# Patient Record
Sex: Female | Born: 1945 | Race: White | Hispanic: No | Marital: Married | Smoking: Never smoker
Health system: Southern US, Community
[De-identification: ages and names within clinical notes are randomized; demographics above are authoritative.]

## PROBLEM LIST (undated history)

## (undated) DIAGNOSIS — I10 Essential (primary) hypertension: Secondary | ICD-10-CM

## (undated) DIAGNOSIS — M869 Osteomyelitis, unspecified: Secondary | ICD-10-CM

## (undated) DIAGNOSIS — N184 Chronic kidney disease, stage 4 (severe): Secondary | ICD-10-CM

## (undated) DIAGNOSIS — I255 Ischemic cardiomyopathy: Secondary | ICD-10-CM

## (undated) DIAGNOSIS — R0600 Dyspnea, unspecified: Secondary | ICD-10-CM

## (undated) DIAGNOSIS — I340 Nonrheumatic mitral (valve) insufficiency: Secondary | ICD-10-CM

## (undated) DIAGNOSIS — I739 Peripheral vascular disease, unspecified: Secondary | ICD-10-CM

## (undated) DIAGNOSIS — I5042 Chronic combined systolic (congestive) and diastolic (congestive) heart failure: Secondary | ICD-10-CM

## (undated) DIAGNOSIS — E785 Hyperlipidemia, unspecified: Secondary | ICD-10-CM

## (undated) DIAGNOSIS — R51 Headache: Secondary | ICD-10-CM

## (undated) DIAGNOSIS — R519 Headache, unspecified: Secondary | ICD-10-CM

## (undated) DIAGNOSIS — Z923 Personal history of irradiation: Secondary | ICD-10-CM

## (undated) DIAGNOSIS — I2510 Atherosclerotic heart disease of native coronary artery without angina pectoris: Secondary | ICD-10-CM

## (undated) DIAGNOSIS — I214 Non-ST elevation (NSTEMI) myocardial infarction: Secondary | ICD-10-CM

## (undated) DIAGNOSIS — B999 Unspecified infectious disease: Secondary | ICD-10-CM

## (undated) DIAGNOSIS — C50919 Malignant neoplasm of unspecified site of unspecified female breast: Secondary | ICD-10-CM

## (undated) DIAGNOSIS — E559 Vitamin D deficiency, unspecified: Secondary | ICD-10-CM

## (undated) DIAGNOSIS — R112 Nausea with vomiting, unspecified: Secondary | ICD-10-CM

## (undated) DIAGNOSIS — I447 Left bundle-branch block, unspecified: Secondary | ICD-10-CM

## (undated) DIAGNOSIS — Z8489 Family history of other specified conditions: Secondary | ICD-10-CM

## (undated) DIAGNOSIS — E119 Type 2 diabetes mellitus without complications: Secondary | ICD-10-CM

## (undated) DIAGNOSIS — M109 Gout, unspecified: Secondary | ICD-10-CM

## (undated) DIAGNOSIS — L97509 Non-pressure chronic ulcer of other part of unspecified foot with unspecified severity: Secondary | ICD-10-CM

## (undated) DIAGNOSIS — G629 Polyneuropathy, unspecified: Secondary | ICD-10-CM

## (undated) DIAGNOSIS — T7840XA Allergy, unspecified, initial encounter: Secondary | ICD-10-CM

## (undated) DIAGNOSIS — M1990 Unspecified osteoarthritis, unspecified site: Secondary | ICD-10-CM

## (undated) DIAGNOSIS — Z9889 Other specified postprocedural states: Secondary | ICD-10-CM

## (undated) HISTORY — DX: Peripheral vascular disease, unspecified: I73.9

## (undated) HISTORY — DX: Unspecified osteoarthritis, unspecified site: M19.90

## (undated) HISTORY — DX: Hyperlipidemia, unspecified: E78.5

## (undated) HISTORY — PX: DILATION AND CURETTAGE OF UTERUS: SHX78

## (undated) HISTORY — DX: Allergy, unspecified, initial encounter: T78.40XA

## (undated) HISTORY — PX: EYE SURGERY: SHX253

## (undated) HISTORY — DX: Essential (primary) hypertension: I10

## (undated) HISTORY — DX: Vitamin D deficiency, unspecified: E55.9

## (undated) HISTORY — DX: Atherosclerotic heart disease of native coronary artery without angina pectoris: I25.10

## (undated) HISTORY — DX: Malignant neoplasm of unspecified site of unspecified female breast: C50.919

## (undated) HISTORY — DX: Polyneuropathy, unspecified: G62.9

## (undated) HISTORY — DX: Ischemic cardiomyopathy: I25.5

## (undated) HISTORY — PX: FINGER SURGERY: SHX640

---

## 1998-11-08 ENCOUNTER — Other Ambulatory Visit: Admission: RE | Admit: 1998-11-08 | Discharge: 1998-11-08 | Payer: Self-pay | Admitting: *Deleted

## 1998-11-23 ENCOUNTER — Encounter: Admission: RE | Admit: 1998-11-23 | Discharge: 1999-02-21 | Payer: Self-pay | Admitting: *Deleted

## 1998-12-07 HISTORY — PX: CATARACT EXTRACTION W/ INTRAOCULAR LENS  IMPLANT, BILATERAL: SHX1307

## 2003-04-27 ENCOUNTER — Other Ambulatory Visit: Admission: RE | Admit: 2003-04-27 | Discharge: 2003-04-27 | Payer: Self-pay | Admitting: Internal Medicine

## 2005-06-19 ENCOUNTER — Other Ambulatory Visit: Admission: RE | Admit: 2005-06-19 | Discharge: 2005-06-19 | Payer: Self-pay | Admitting: Internal Medicine

## 2005-08-07 ENCOUNTER — Encounter (INDEPENDENT_AMBULATORY_CARE_PROVIDER_SITE_OTHER): Payer: Self-pay | Admitting: Specialist

## 2005-08-07 ENCOUNTER — Inpatient Hospital Stay (HOSPITAL_COMMUNITY): Admission: EM | Admit: 2005-08-07 | Discharge: 2005-08-10 | Payer: Self-pay | Admitting: Emergency Medicine

## 2007-09-24 ENCOUNTER — Other Ambulatory Visit: Admission: RE | Admit: 2007-09-24 | Discharge: 2007-09-24 | Payer: Self-pay | Admitting: Internal Medicine

## 2007-10-18 ENCOUNTER — Ambulatory Visit: Payer: Self-pay | Admitting: Gastroenterology

## 2007-12-06 ENCOUNTER — Ambulatory Visit: Payer: Self-pay | Admitting: Cardiovascular Disease

## 2007-12-16 ENCOUNTER — Ambulatory Visit: Payer: Self-pay

## 2008-04-07 HISTORY — PX: COLONOSCOPY W/ BIOPSIES AND POLYPECTOMY: SHX1376

## 2009-04-07 HISTORY — PX: CORONARY ARTERY BYPASS GRAFT: SHX141

## 2009-05-23 ENCOUNTER — Inpatient Hospital Stay (HOSPITAL_COMMUNITY): Admission: EM | Admit: 2009-05-23 | Discharge: 2009-06-01 | Payer: Self-pay | Admitting: Emergency Medicine

## 2009-05-23 ENCOUNTER — Ambulatory Visit: Payer: Self-pay | Admitting: Cardiology

## 2009-05-23 DIAGNOSIS — I214 Non-ST elevation (NSTEMI) myocardial infarction: Secondary | ICD-10-CM

## 2009-05-23 HISTORY — DX: Non-ST elevation (NSTEMI) myocardial infarction: I21.4

## 2009-05-24 ENCOUNTER — Ambulatory Visit: Payer: Self-pay | Admitting: Surgery

## 2009-05-24 ENCOUNTER — Encounter: Payer: Self-pay | Admitting: Cardiology

## 2009-05-24 HISTORY — PX: CARDIAC CATHETERIZATION: SHX172

## 2009-05-25 ENCOUNTER — Encounter: Payer: Self-pay | Admitting: Cardiology

## 2009-05-27 ENCOUNTER — Encounter: Payer: Self-pay | Admitting: Cardiovascular Disease

## 2009-05-29 ENCOUNTER — Encounter: Payer: Self-pay | Admitting: Cardiovascular Disease

## 2009-06-05 ENCOUNTER — Encounter: Payer: Self-pay | Admitting: Cardiovascular Disease

## 2009-06-19 ENCOUNTER — Ambulatory Visit: Payer: Self-pay | Admitting: Surgery

## 2009-06-19 ENCOUNTER — Encounter: Admission: RE | Admit: 2009-06-19 | Discharge: 2009-06-19 | Payer: Self-pay | Admitting: Surgery

## 2009-06-20 DIAGNOSIS — J438 Other emphysema: Secondary | ICD-10-CM

## 2009-06-20 DIAGNOSIS — I509 Heart failure, unspecified: Secondary | ICD-10-CM

## 2009-06-20 DIAGNOSIS — E782 Mixed hyperlipidemia: Secondary | ICD-10-CM

## 2009-06-20 DIAGNOSIS — I10 Essential (primary) hypertension: Secondary | ICD-10-CM | POA: Insufficient documentation

## 2009-06-20 DIAGNOSIS — J45909 Unspecified asthma, uncomplicated: Secondary | ICD-10-CM

## 2009-06-26 ENCOUNTER — Ambulatory Visit: Payer: Self-pay | Admitting: Cardiovascular Disease

## 2009-07-02 ENCOUNTER — Encounter: Payer: Self-pay | Admitting: Cardiovascular Disease

## 2009-07-12 ENCOUNTER — Encounter (HOSPITAL_COMMUNITY): Admission: RE | Admit: 2009-07-12 | Discharge: 2009-10-10 | Payer: Self-pay | Admitting: Cardiovascular Disease

## 2009-07-12 ENCOUNTER — Encounter: Payer: Self-pay | Admitting: Cardiovascular Disease

## 2009-07-12 ENCOUNTER — Ambulatory Visit: Payer: Self-pay

## 2009-07-12 ENCOUNTER — Ambulatory Visit (HOSPITAL_COMMUNITY): Admission: RE | Admit: 2009-07-12 | Discharge: 2009-07-12 | Payer: Self-pay | Admitting: Cardiovascular Disease

## 2009-07-12 ENCOUNTER — Ambulatory Visit: Payer: Self-pay | Admitting: Cardiology

## 2009-07-23 ENCOUNTER — Encounter: Admission: RE | Admit: 2009-07-23 | Discharge: 2009-10-21 | Payer: Self-pay | Admitting: Internal Medicine

## 2009-07-23 ENCOUNTER — Encounter: Payer: Self-pay | Admitting: Cardiovascular Disease

## 2009-09-05 ENCOUNTER — Ambulatory Visit: Payer: Self-pay | Admitting: Cardiovascular Disease

## 2009-09-05 DIAGNOSIS — I739 Peripheral vascular disease, unspecified: Secondary | ICD-10-CM

## 2009-11-28 ENCOUNTER — Encounter: Payer: Self-pay | Admitting: Cardiovascular Disease

## 2009-11-29 ENCOUNTER — Ambulatory Visit: Payer: Self-pay | Admitting: Cardiovascular Disease

## 2009-11-29 ENCOUNTER — Ambulatory Visit: Payer: Self-pay

## 2009-12-26 ENCOUNTER — Encounter: Payer: Self-pay | Admitting: Cardiovascular Disease

## 2009-12-28 ENCOUNTER — Other Ambulatory Visit: Admission: RE | Admit: 2009-12-28 | Discharge: 2009-12-28 | Payer: Self-pay | Admitting: Internal Medicine

## 2010-01-09 ENCOUNTER — Encounter (INDEPENDENT_AMBULATORY_CARE_PROVIDER_SITE_OTHER): Payer: Self-pay | Admitting: *Deleted

## 2010-01-21 ENCOUNTER — Encounter (INDEPENDENT_AMBULATORY_CARE_PROVIDER_SITE_OTHER): Payer: Self-pay | Admitting: *Deleted

## 2010-02-21 ENCOUNTER — Encounter (INDEPENDENT_AMBULATORY_CARE_PROVIDER_SITE_OTHER): Payer: Self-pay | Admitting: *Deleted

## 2010-02-22 ENCOUNTER — Ambulatory Visit: Payer: Self-pay | Admitting: Gastroenterology

## 2010-02-22 ENCOUNTER — Encounter (INDEPENDENT_AMBULATORY_CARE_PROVIDER_SITE_OTHER): Payer: Self-pay | Admitting: *Deleted

## 2010-03-05 ENCOUNTER — Telehealth (INDEPENDENT_AMBULATORY_CARE_PROVIDER_SITE_OTHER): Payer: Self-pay | Admitting: *Deleted

## 2010-03-08 ENCOUNTER — Ambulatory Visit: Payer: Self-pay | Admitting: Gastroenterology

## 2010-03-12 ENCOUNTER — Encounter: Payer: Self-pay | Admitting: Gastroenterology

## 2010-04-27 ENCOUNTER — Encounter: Payer: Self-pay | Admitting: Cardiovascular Disease

## 2010-05-07 NOTE — Letter (Signed)
Summary: Patient Notice- Polyp Results  [facility]  [address] [place], [place] [address]   Phone: [phone]  Fax: [phone]        March 12, 2010 MRN: [id number]    [name] [address] [place], [place]  [address]    Dear Ms. Sydney,  I am pleased to inform you that the colon polyp(s) removed during your recent colonoscopy was (were) found to be benign (no cancer detected) upon pathologic examination.  I recommend you have a repeat colonoscopy examination in 5_ years to look for recurrent polyps, as having colon polyps increases your risk for having recurrent polyps or even colon cancer in the future.  Should you develop new or worsening symptoms of abdominal pain, bowel habit changes or bleeding from the rectum or bowels, please schedule an evaluation with either your primary care physician or with me.  Additional information/recommendations:  __ No further action with gastroenterology is needed at this time. Please      follow-up with your primary care physician for your other healthcare      needs.  __ Please call [phone] to schedule a return visit to review your      situation.  __ Please keep your follow-up visit as already scheduled.  _x_ Continue treatment plan as outlined the day of your exam.  Please call us if you are having persistent problems or have questions about your condition that have not been fully answered at this time.  Sincerely,  Inda Castle MD  This letter has been electronically signed by your physician.  Appended Document: Patient Notice- Polyp Results Letter mailed

## 2010-05-07 NOTE — Procedures (Signed)
Summary: Colonoscopy  Patient: [name] Note: All result statuses are Final unless otherwise noted.  Tests: (1) Colonoscopy (COL)   COL Colonoscopy           [facility]     [place], [place]  [address]           COLONOSCOPY PROCEDURE REPORT           PATIENT:  Pamela, [name]  MR#:  [id number]     BIRTHDATE:  04/25/1945, 64 yrs. old  GENDER:  female           ENDOSCOPIST:  Sandy Salaam. Deatra Ina, MD     Referred by:  Rachel Moulds, D.O.           PROCEDURE DATE:  03/08/2010     PROCEDURE:  Colonoscopy with snare polypectomy     ASA CLASS:  Class II     INDICATIONS:  1) Routine Risk Screening           MEDICATIONS:   Fentanyl 50 mcg IV, Versed 6 mg IV           DESCRIPTION OF PROCEDURE:   After the risks benefits and     alternatives of the procedure were thoroughly explained, informed     consent was obtained.  Digital rectal exam was performed and     revealed no abnormalities.   The LB [id number] endoscope     was introduced through the anus and advanced to the cecum, which     was identified by both the appendix and ileocecal valve, without     limitations.  The quality of the prep was excellent, using     MoviPrep.  The instrument was then slowly withdrawn as the colon     was fully examined.     <<PROCEDUREIMAGES>>           FINDINGS:  Two polyps were found in the ascending colon (see     image5 and image6). 2 3-17mm sessile polyps - removed with cold     polypectomy snare and submitted to pathology  Moderate     diverticulosis was found in the sigmoid to descending colon     segments (see image13).  This was otherwise a normal examination     of the colon (see image2, image3, image4, image9, image12,     image16, and image18).   Retroflexed views in the rectum revealed     no abnormalities.    The time to cecum =  2.25  minutes. The scope     was then withdrawn (time =  8.25  min) from the patient and the     procedure  completed.           COMPLICATIONS:  None           ENDOSCOPIC IMPRESSION:     1) Two polyps in the ascending colon     2) Moderate diverticulosis in the sigmoid to descending colon     segments     3) Otherwise normal examination     RECOMMENDATIONS:     1) If the polyp(s) removed today are proven to be adenomatous     (pre-cancerous) polyps, you will need a repeat colonoscopy in 5     years. Otherwise you should continue to follow colorectal cancer     screening guidelines for "routine risk" patients with colonoscopy     in 10 years.  REPEAT EXAM:   You will receive a letter from Dr. Deatra Ina in 1-2     weeks, after reviewing the final pathology, with followup     recommendations.           ______________________________     Sandy Salaam Deatra Ina, MD           CC:           n.     eSIGNED:   Sandy Salaam. [name] at 03/08/2010 08:46 AM           [name], [id number]  Note: An exclamation mark (!) indicates a result that was not dispersed into the flowsheet. Document Creation Date: 03/08/2010 8:47 AM _______________________________________________________________________  (1) Order result status: Final Collection or observation date-time: 03/08/2010 08:40 Requested date-time:  Receipt date-time:  Reported date-time:  Referring Physician:   Ordering Physician: Erskine Emery [phone]) Specimen Source:  Source: Tawanna Cooler Order Number: [id number] Lab site:   Appended Document: Colonoscopy     Procedures Next Due Date:    Colonoscopy: 03/2015

## 2010-05-07 NOTE — Assessment & Plan Note (Signed)
Summary: per check out/sf   Primary Provider:  DR. Rachel Moulds  CC:  check up.  History of Present Illness: [name] is seen as a new patient to me.  I believe I saw her a few years ago. for LBBB.  She was hospitalized at the end of February for [place].  Cath by Dr Marigene Ehlers showed severe 2VD involving the LAD and a codominant circ.  She had been noncompliant with her meds especially DM and HTN.  I reviewed her hospital records and cath film  She subsequently had CABG by Dr Cyndia Bent.  Her post op course was bening.  She has F/U with Dr Cyndia Bent and wounds have healed well with no pleural effusion on F/U CXR. Her BP has been running high.  She would benefit from a higher dose of Diovan.  She says she has been compliant with her meds since hospital D/C  Echo on 4/7 showed EF 40-45% with mild to moderate MR.  Current Problems (verified): 1)  Claudication  (ICD-443.9) 2)  Hyperlipidemia  (ICD-272.4) 3)  CHF  (ICD-428.0) 4)  Hypertension  (ICD-401.9) 5)  Cad  (ICD-414.00) 6)  Shortness of Breath  (ICD-786.05) 7)  Renal Insufficiency  (ICD-588.9) 8)  Gout  (ICD-274.9) 9)  Hypercholesterolemia  (ICD-272.0) 10)  Emphysema  (ICD-492.8) 11)  Asthma  (ICD-493.90) 12)  Dm  (ICD-250.00)  Current Medications (verified): 1)  Oxycodone Hcl 5 Mg Caps (Oxycodone Hcl) .... As Needed 2)  Metformin Hcl 500 Mg Tabs (Metformin Hcl) .... 2 Tabs  By Mouth Two Times A Day 3)  Diovan 320 Mg Tabs (Valsartan) .... One Half Tablet By Mouth Once Daily 4)  Crestor 40 Mg Tabs (Rosuvastatin Calcium) .... Take One Tablet By Mouth Daily. 5)  Metoprolol Tartrate 25 Mg Tabs (Metoprolol Tartrate) .... Take One Tablet By Mouth Twice A Day 6)  Onglyza 2.5 Mg Tabs (Saxagliptin Hcl) .Marland Kitchen.. 1 Tab  Po  Once Daily 7)  Glimepiride 4 Mg Tabs (Glimepiride) .Marland Kitchen.. 1 Tab By Mouth Two Times A Day 8)  Aspirin Ec 325 Mg Tbec (Aspirin) .... Take One Tablet By Mouth Daily 9)  Amlodipine Besylate 5 Mg Tabs (Amlodipine Besylate) .... Take One Tablet By  Mouth Daily  Allergies (verified): No Known Drug Allergies  Past History:  Past Medical History: Last updated: 08-Jul-2009 Current Problems:  HYPERLIPIDEMIA (ICD-272.4) CHF (ICD-428.0) HYPERTENSION (ICD-401.9) CAD (ICD-414.00) SHORTNESS OF BREATH (ICD-786.05) RENAL INSUFFICIENCY (ICD-588.9) GOUT (ICD-274.9) HYPERCHOLESTEROLEMIA (ICD-272.0) EMPHYSEMA (ICD-492.8) ASTHMA (ICD-493.90) DM (ICD-250.00)  Past Surgical History: Last updated: 08-Jul-2009  Median sternotomy, extracorporeal circulation,  coronary artery bypass graft surgery x4 using left internal mammary artery graft to left anterior descending coronary artery, with a  saphenous vein graft to diagonal branch of the LAD, and a sequential saphenous vein graft to first and second obtuse marginal branches of the   left circumflex coronary artery.  Endoscopic vein harvesting from the  right leg.  Gilford Raid, M.D.  BB/MEDQ  D:  05/28/2009  T:  05/29/2009  Job:  [id number] cc:   Wallis Bamberg. Johnsie Cancel, MD, [facility]    I&D right index finger DIP joint with arthrotomy and synovectomy of the distal interphalangeal joint.  Family History: Last updated: 07-08-2009  Father died with a history of CABG and pneumonia.  Her   mother is alive at age 14 with a history of CABG in her 12s as well as   hypertension, diabetes.   Social History: Last updated: 08-Jul-2009 She lives with her husband.  They have 1 child and  3   grandchildren.  Her extended family is in and [place], [place].  She   denies tobacco, alcohol, or drug use.  She is not routinely  Review of Systems       Denies fever, malais, weight loss, blurry vision, decreased visual acuity, cough, sputum, SOB, hemoptysis, pleuritic pain, palpitaitons, heartburn, abdominal pain, melena, lower extremity edema, claudication, or rash.   Vital Signs:  Patient profile:   65 year old female Height:      64 inches Weight:      170 pounds BMI:     29.29 Pulse rate:   89 / minute Resp:     14  per minute BP sitting:   155 / 96  (left arm)  Vitals Entered By: Burnett Kanaris (September 05, 2009 11:26 AM)  Physical Exam  General:  Affect appropriate Healthy:  appears stated age 36: normal Neck supple with no adenopathy JVP normal no bruits no thyromegaly Lungs clear with no wheezing and good diaphragmatic motion Heart:  S1/S2 no murmur,rub, gallop or click PMI normal Abdomen: benighn, BS positve, no tenderness, no AAA no bruit.  No HSM or HJR Distal pulses intact with no bruits No edema Neuro non-focal Skin warm and dry    Impression & Recommendations:  Problem # 1:  HYPERTENSION (ICD-401.9) Add amlodipine and continue Diovan Her updated medication list for this problem includes:    Diovan 320 Mg Tabs (Valsartan) ..... One half tablet by mouth once daily    Metoprolol Tartrate 25 Mg Tabs (Metoprolol tartrate) .Marland Kitchen... Take one tablet by mouth twice a day    Aspirin Ec 325 Mg Tbec (Aspirin) .Marland Kitchen... Take one tablet by mouth daily    Amlodipine Besylate 5 Mg Tabs (Amlodipine besylate) .Marland Kitchen... Take one tablet by mouth daily  Problem # 2:  HYPERLIPIDEMIA (ICD-272.4) Continue statin in light of recent CABG Her updated medication list for this problem includes:    Crestor 40 Mg Tabs (Rosuvastatin calcium) .Marland Kitchen... Take one tablet by mouth daily.  Problem # 3:  CAD (ICD-414.00) S/P CABG  Wounds healed well Continue ASA and BB Her updated medication list for this problem iSncludes:    Metoprolol Tartrate 25 Mg Tabs (Metoprolol tartrate) .Marland Kitchen... Take one tablet by mouth twice a day    Aspirin Ec 325 Mg Tbec (Aspirin) .Marland Kitchen... Take one tablet by mouth daily    Amlodipine Besylate 5 Mg Tabs (Amlodipine besylate) .Marland Kitchen... Take one tablet by mouth daily  Other Orders: Arterial Duplex Lower Extremity (Arterial Duplex Low)  Patient Instructions: 1)  Your physician recommends that you schedule a follow-up appointment in: 8-10 WEEKS 2)  Your physician has recommended you make the following  change in your medication: START AMLODIPINE 5MG  ONCE DAILY 3)  Your physician has requested that you have a lower or upper extremity arterial duplex.  This test is an ultrasound of the arteries in the legs or arms.  It looks at arterial blood flow in the legs and arms.  Allow one hour for Lower and Upper Arterial scans. There are no restrictions or special instructions.SCHEDULE IN 8-10 WEEKS AT TIME OF FOLLOW UP Prescriptions: AMLODIPINE BESYLATE 5 MG TABS (AMLODIPINE BESYLATE) Take one tablet by mouth daily  #30 x 12   Entered by:   Fredia Beets, RN   Authorized by:   Bosie Clos, MD, [facility]   Signed by:   Fredia Beets, RN on 09/05/2009   Method used:   Electronically to  [name] ([facility]       [address] [name] [address]       [place], [place]  [address]       Ph: [phone] or [phone]       Fax: [phone]   RxID:   [id number]

## 2010-05-07 NOTE — Letter (Signed)
Summary: [facility] Instructions  [facility]  [place], [place] [address]   Phone: [phone]  Fax: [phone]       [name]    20-Jan-1946    MRN: [id number]        Procedure Day Sudie Grumbling:  Wendi Snipes  03/08/10     Arrival Time:  7:30AM      Procedure Time:  8:30AM     Location of Procedure:                    [facility]  [place] (4th Floor)                      [place]   Starting 5 days prior to your procedure 03/03/10 do not eat nuts, seeds, popcorn, corn, beans, peas,  salads, or any raw vegetables.  Do not take any fiber supplements (e.g. Metamucil, Citrucel, and Benefiber).  THE DAY BEFORE YOUR PROCEDURE         DATE: 03/07/10  DAY: THURSDAY  1.  Drink clear liquids the entire day-NO SOLID FOOD  2.  Do not drink anything colored red or purple.  Avoid juices with pulp.  No orange juice.  3.  Drink at least 64 oz. (8 glasses) of fluid/clear liquids during the day to prevent dehydration and help the prep work efficiently.  CLEAR LIQUIDS INCLUDE: Water Jello Ice Popsicles Tea (sugar ok, no milk/cream) Powdered fruit flavored drinks Coffee (sugar ok, no milk/cream) Gatorade Juice: apple, white grape, white cranberry  Lemonade Clear bullion, consomm, broth Carbonated beverages (any kind) Strained chicken noodle soup Hard Candy                             4.  In the morning, mix first dose of MoviPrep solution:    Empty 1 Pouch A and 1 Pouch B into the disposable container    Add lukewarm drinking water to the top line of the container. Mix to dissolve    Refrigerate (mixed solution should be used within 24 hrs)  5.  Begin drinking the prep at 5:00 p.m. The MoviPrep container is divided by 4 marks.   Every 15 minutes drink the solution down to the next mark (approximately 8 oz) until the full liter is complete.   6.  Follow completed prep with 16 oz of clear liquid of your choice (Nothing  red or purple).  Continue to drink clear liquids until bedtime.  7.  Before going to bed, mix second dose of MoviPrep solution:    Empty 1 Pouch A and 1 Pouch B into the disposable container    Add lukewarm drinking water to the top line of the container. Mix to dissolve    Refrigerate  THE DAY OF YOUR PROCEDURE      DATE: 03/08/10  DAY: FRIDAY  Beginning at 3:30AM (5 hours before procedure):         1. Every 15 minutes, drink the solution down to the next mark (approx 8 oz) until the full liter is complete.  2. Follow completed prep with 16 oz. of clear liquid of your choice.    3. You may drink clear liquids until 6:30AM (2 HOURS BEFORE PROCEDURE).   MEDICATION INSTRUCTIONS  Unless otherwise instructed, you should take regular prescription medications with a small sip of water   as early as possible the morning of  your procedure.  Diabetic patients - see separate instructions.   Additional medication instructions: Hold HCTZ the morning of procedure.         OTHER INSTRUCTIONS  You will need a responsible adult at least 65 years of age to accompany you and drive you home.   This person must remain in the waiting room during your procedure.  Wear loose fitting clothing that is easily removed.  Leave jewelry and other valuables at home.  However, you may wish to bring a book to read or  an iPod/MP3 player to listen to music as you wait for your procedure to start.  Remove all body piercing jewelry and leave at home.  Total time from sign-in until discharge is approximately 2-3 hours.  You should go home directly after your procedure and rest.  You can resume normal activities the  day after your procedure.  The day of your procedure you should not:   Drive   Make legal decisions   Operate machinery   Drink alcohol   Return to work  You will receive specific instructions about eating, activities and medications before you leave.    The above  instructions have been reviewed and explained to me by  Emerson Monte RN  February 22, 2010 10:25 AM     I fully understand and can verbalize these instructions _____________________________ Date _________

## 2010-05-07 NOTE — Progress Notes (Signed)
Summary: Questions  Phone Note Call from Patient Call back at Home Phone [phone]   Caller: Patient Call For: Dr. Deatra Ina Summary of Call: Questions about what she can and can not eat prior to her procedure on friday Initial call taken by: Becky Sax,  March 05, 2010 2:50 PM  Follow-up for Phone Call        Diet restrictions reviewed with pt. Follow-up by: Ulice Dash RN,  March 05, 2010 4:20 PM

## 2010-05-07 NOTE — Miscellaneous (Signed)
Summary: [facility] Progress Note  [facility] Progress Note   Imported By: Sallee Provencal 08/01/2009 14:07:10  _____________________________________________________________________  External Attachment:    Type:   Image     Comment:   External Document

## 2010-05-07 NOTE — Letter (Signed)
Summary: Diabetic Instructions  [facility]  [place], [place] [address]   Phone: [phone]  Fax: [phone]    [name] 06-12-1945 MRN: [id number]   _ x _   ORAL DIABETIC MEDICATION INSTRUCTIONS  The day before your procedure:   Take your diabetic pill as you do normally  The day of your procedure:   Do not take your diabetic pill    We will check your blood sugar levels during the admission process and again in Recovery before discharging you home  ________________________________________________________________________

## 2010-05-07 NOTE — Letter (Signed)
Summary: Generic Letter  [facility] [place]  [address] [place]   [place], [place] [address]   Phone: [phone]  Fax: [phone]        January 09, 2010 MRN: [id number]    [name] [address] [place], [place]  [address]    Dear [name],        [name], I have attached a copy of the lab work we received from Dr Melford Aase. Dr Johnsie Cancel agrees with restarting the crestor to get the cholestrol numbers lower. Please follow up with Dr Melford Aase after restarting the statin. Also if you would like a referral to a Dietician to help with the cholestrol and your blood sugar please let me know and we can make that referral for you. Please call us with any questions or concerns.      Sincerely,  Fredia Beets, RN/Dr Jenkins Rouge

## 2010-05-07 NOTE — Miscellaneous (Signed)
Summary: [facility] Progress Note  [facility] Progress Note   Imported By: Sallee Provencal 09/17/2009 12:49:03  _____________________________________________________________________  External Attachment:    Type:   Image     Comment:   External Document

## 2010-05-07 NOTE — Letter (Signed)
Summary: Pre Visit Letter Revised  [facility]  [place], [place] [address]   Phone: [phone]  Fax: [phone]        01/21/2010 MRN: [id number] GURKIRAT RASPANTI [place],   [address]             Procedure Date:  03-08-10   Welcome to the Gastroenterology Division at [facility].    You are scheduled to see a nurse for your pre-procedure visit on 02-22-10 at 10:00a.m. on the 3rd floor at [facility] [place] [employer].  We ask that you try to arrive at our office 15 minutes prior to your appointment time to allow for check-in.  Please take a minute to review the attached form.  If you answer "Yes" to one or more of the questions on the first page, we ask that you call the person listed at your earliest opportunity.  If you answer "No" to all of the questions, please complete the rest of the form and bring it to your appointment.    Your nurse visit will consist of discussing your medical and surgical history, your immediate family medical history, and your medications.   If you are unable to list all of your medications on the form, please bring the medication bottles to your appointment and we will list them.  We will need to be aware of both prescribed and over the counter drugs.  We will need to know exact dosage information as well.    Please be prepared to read and sign documents such as consent forms, a financial agreement, and acknowledgement forms.  If necessary, and with your consent, a friend or relative is welcome to sit-in on the nurse visit with you.  Please bring your insurance card so that we may make a copy of it.  If your insurance requires a referral to see a specialist, please bring your referral form from your primary care physician.  No co-pay is required for this nurse visit.     If you cannot keep your appointment, please call [phone] to cancel or reschedule prior to your appointment date.  This allows  [name] the opportunity to schedule an appointment for another patient in need of care.    Thank you for choosing [facility] for your medical needs.  We appreciate the opportunity to care for you.  Please visit [name] at our website  to learn more about our practice.  Sincerely, The Gastroenterology Division

## 2010-05-07 NOTE — Assessment & Plan Note (Signed)
Summary: eph/ gd   History of Present Illness: [name] is seen as a new patient to me.  I believe I saw her a few years ago. for LBBB.  She was hospitalized at the end of February for [place].  Cath by Dr Marigene Ehlers showed severe 2VD involving the LAD and a codominant circ.  She had been noncompliant with her meds especially DM and HTN.  I reviewed her hospital records and cath film  She subsequently had CABG by Dr Cyndia Bent.  Her post op course was bening.  She has F/U with Dr Cyndia Bent and wounds have healed well with no pleural effusion on F/U CXR. Her BP has been running high.  She would benefit from a higher dose of Diovan.  She says she has been compliant with her meds since hospital D/C  Current Problems (verified): 1)  Hyperlipidemia  (ICD-272.4) 2)  CHF  (ICD-428.0) 3)  Hypertension  (ICD-401.9) 4)  Cad  (ICD-414.00) 5)  Shortness of Breath  (ICD-786.05) 6)  Renal Insufficiency  (ICD-588.9) 7)  Gout  (ICD-274.9) 8)  Hypercholesterolemia  (ICD-272.0) 9)  Emphysema  (ICD-492.8) 10)  Asthma  (ICD-493.90) 11)  Dm  (ICD-250.00)  Current Medications (verified): 1)  Oxycodone Hcl 5 Mg Caps (Oxycodone Hcl) .... As Needed 2)  Metformin Hcl 500 Mg Tabs (Metformin Hcl) .Marland Kitchen.. 1 Tab By Mouth Two Times A Day 3)  Diovan 320 Mg Tabs (Valsartan) .... One Half Tablet By Mouth Once Daily 4)  Crestor 40 Mg Tabs (Rosuvastatin Calcium) .... Take One Tablet By Mouth Daily. 5)  Metoprolol Tartrate 25 Mg Tabs (Metoprolol Tartrate) .... Take One Tablet By Mouth Twice A Day 6)  Onglyza 2.5 Mg Tabs (Saxagliptin Hcl) .Marland Kitchen.. 1 Tab  Po  Once Daily 7)  Glimepiride 4 Mg Tabs (Glimepiride) .Marland Kitchen.. 1 Tab By Mouth Two Times A Day 8)  Aspirin Ec 325 Mg Tbec (Aspirin) .... Take One Tablet By Mouth Daily  Allergies (verified): No Known Drug Allergies  Past History:  Past Medical History: Last updated: 07-18-09 Current Problems:  HYPERLIPIDEMIA (ICD-272.4) CHF (ICD-428.0) HYPERTENSION (ICD-401.9) CAD (ICD-414.00) SHORTNESS  OF BREATH (ICD-786.05) RENAL INSUFFICIENCY (ICD-588.9) GOUT (ICD-274.9) HYPERCHOLESTEROLEMIA (ICD-272.0) EMPHYSEMA (ICD-492.8) ASTHMA (ICD-493.90) DM (ICD-250.00)  Past Surgical History: Last updated: 2009-07-18  Median sternotomy, extracorporeal circulation,  coronary artery bypass graft surgery x4 using left internal mammary artery graft to left anterior descending coronary artery, with a  saphenous vein graft to diagonal branch of the LAD, and a sequential saphenous vein graft to first and second obtuse marginal branches of the   left circumflex coronary artery.  Endoscopic vein harvesting from the  right leg.  Gilford Raid, M.D.  BB/MEDQ  D:  05/28/2009  T:  05/29/2009  Job:  [id number] cc:   Wallis Bamberg. Johnsie Cancel, MD, [facility]    I&D right index finger DIP joint with arthrotomy and synovectomy of the distal interphalangeal joint.  Family History: Last updated: 07/18/2009  Father died with a history of CABG and pneumonia.  Her   mother is alive at age 7 with a history of CABG in her 65s as well as   hypertension, diabetes.   Social History: Last updated: July 18, 2009 She lives with her husband.  They have 1 child and 3   grandchildren.  Her extended family is in and [place], [place].  She   denies tobacco, alcohol, or drug use.  She is not routinely  Review of Systems       Denies fever, malais, weight loss, blurry vision, decreased  visual acuity, cough, sputum, SOB, hemoptysis, pleuritic pain, palpitaitons, heartburn, abdominal pain, melena, lower extremity edema, claudication, or rash.   Vital Signs:  Patient profile:   65 year old female Height:      64 inches Weight:      169 pounds BMI:     29.11 Pulse rate:   85 / minute Resp:     12 per minute BP sitting:   177 / 90  (left arm)  Vitals Entered By: Burnett Kanaris (June 26, 2009 3:43 PM)  Physical Exam  General:  Affect appropriate Healthy:  appears stated age 12: normal Neck supple with no adenopathy JVP normal  no bruits no thyromegaly Lungs clear with no wheezing and good diaphragmatic motion Heart:  S1/S2 no murmur,rub, gallop or click PMI normal Abdomen: benighn, BS positve, no tenderness, no AAA no bruit.  No HSM or HJR Distal pulses intact with no bruits No edema Neuro non-focal Skin warm and dry Endoscopic RLE harvest sige well healed Sternum well healed   Impression & Recommendations:  Problem # 1:  CAD (ICD-414.00) S/P CABG.  Continue ASA and BB Her updated medication list for this problem includes:    Metoprolol Tartrate 25 Mg Tabs (Metoprolol tartrate) .Marland Kitchen... Take one tablet by mouth twice a day    Aspirin Ec 325 Mg Tbec (Aspirin) .Marland Kitchen... Take one tablet by mouth daily  Problem # 2:  HYPERTENSION (ICD-401.9) Increase Diovan.  F/U BP records at home Her updated medication list for this problem includes:    Diovan 320 Mg Tabs (Valsartan) ..... One half tablet by mouth once daily    Metoprolol Tartrate 25 Mg Tabs (Metoprolol tartrate) .Marland Kitchen... Take one tablet by mouth twice a day    Aspirin Ec 325 Mg Tbec (Aspirin) .Marland Kitchen... Take one tablet by mouth daily  Problem # 3:  HYPERLIPIDEMIA (ICD-272.4) Continue high dose statin.  Lipid and liver in 3 months Her updated medication list for this problem includes:    Crestor 40 Mg Tabs (Rosuvastatin calcium) .Marland Kitchen... Take one tablet by mouth daily.  Problem # 4:  DM (ICD-250.00) HbAic per Dr Johnnye Sima. Stressed compliance issues Her updated medication list for this problem includes:    Metformin Hcl 500 Mg Tabs (Metformin hcl) .Marland Kitchen... 1 tab by mouth two times a day    Diovan 320 Mg Tabs (Valsartan) ..... One half tablet by mouth once daily    Onglyza 2.5 Mg Tabs (Saxagliptin hcl) .Marland Kitchen... 1 tab  po  once daily    Glimepiride 4 Mg Tabs (Glimepiride) .Marland Kitchen... 1 tab by mouth two times a day    Aspirin Ec 325 Mg Tbec (Aspirin) .Marland Kitchen... Take one tablet by mouth daily  Problem # 5:  CHF (ICD-428.0) Reassess EF post CABG.  Echo in 4 weeks.  Increase ARB Her  updated medication list for this problem includes:    Diovan 320 Mg Tabs (Valsartan) ..... One half tablet by mouth once daily    Metoprolol Tartrate 25 Mg Tabs (Metoprolol tartrate) .Marland Kitchen... Take one tablet by mouth twice a day    Aspirin Ec 325 Mg Tbec (Aspirin) .Marland Kitchen... Take one tablet by mouth daily  Orders: Echocardiogram (Echo)  Patient Instructions: 1)  Your physician recommends that you schedule a follow-up appointment in: 3 MONTHS 2)  Your physician has recommended you make the following change in your medication: DIOVAN 80MG  TAKE TWO TABLETS ONCE DAILY UNTIL GONE THEN START DIOVAN 320MG  ONE HALF TABLET ONCE DAILY 3)  Your physician has requested that you have  an echocardiogram.  Echocardiography is a painless test that uses sound waves to create images of your heart. It provides your doctor with information about the size and shape of your heart and how well your heart's chambers and valves are working.  This procedure takes approximately one hour. There are no restrictions for this procedure.SCHEDULE IN ONE MONTH Prescriptions: METOPROLOL TARTRATE 25 MG TABS (METOPROLOL TARTRATE) Take one tablet by mouth twice a day  #60 x 12   Entered by:   Fredia Beets, RN   Authorized by:   Bosie Clos, MD, [facility]   Signed by:   Fredia Beets, RN on 06/26/2009   Method used:   [facility]        [name]       [name]       [place], [place]  [address]       Ph: [phone] or [phone]       Fax: [phone]   RxIDSU:[id number] DIOVAN 320 MG TABS (VALSARTAN) ONE HALF TABLET by mouth once daily  #30 x 12   Entered by:   Fredia Beets, RN   Authorized by:   Bosie Clos, MD, [facility]   Signed by:   Fredia Beets, RN on 06/26/2009   Method used:   [facility]        [name]       [name]       [place], [place]  [address]       Ph: [phone] or [phone]       Fax: [phone]   RxID:    [id number] METOPROLOL TARTRATE 25 MG TABS (METOPROLOL TARTRATE) Take one tablet by mouth twice a day  #180 x 3   Entered by:   Burnett Kanaris   Authorized by:   Bosie Clos, MD, [facility]   Signed by:   Fredia Beets, RN on 06/26/2009   Method used:   [facility]        [name]       [name]       [place], [place]  [address]       Ph: [phone] or [phone]       Fax: [phone]   RxID:   [id number] CRESTOR 40 MG TABS (ROSUVASTATIN CALCIUM) Take one tablet by mouth daily.  #90 x 3   Entered by:   Burnett Kanaris   Authorized by:   Bosie Clos, MD, [facility]   Signed by:   Fredia Beets, RN on 06/26/2009   Method used:   [facility]        [name]       [name]       [place], [place]  [address]       Ph: [phone] or [phone]       Fax: [phone]   RxID:   [id number]    EKG Report  Procedure date:  05/29/2009  Findings:      NSR 80 LBBB

## 2010-05-07 NOTE — Miscellaneous (Signed)
Summary: [facility] Cardiac Physician Order/Treatment Plan  [facility]ician Order/Treatment Plan   Imported By: Sallee Provencal 06/19/2009 10:09:53  _____________________________________________________________________  External Attachment:    Type:   Image     Comment:   External Document

## 2010-05-07 NOTE — Miscellaneous (Signed)
Summary: [facility] Previsit/prep  Clinical Lists Changes  Medications: Added new medication of MOVIPREP 100 GM  SOLR (PEG-KCL-NACL-NASULF-NA ASC-C) As per prep instructions. - Signed Rx of MOVIPREP 100 GM  SOLR (PEG-KCL-NACL-NASULF-NA ASC-C) As per prep instructions.;  #1 x 0;  Signed;  Entered by: Emerson Monte RN;  Authorized by: Inda Castle MD;  Method used: [name] [name], [place], [place]  [address], Ph: [phone] or [phone], Fax: [phone] Observations: Added new observation of NKA: T (02/22/2010 9:40)    Prescriptions: MOVIPREP 100 GM  SOLR (PEG-KCL-NACL-NASULF-NA ASC-C) As per prep instructions.  #1 x 0   Entered by:   Emerson Monte RN   Authorized by:   Inda Castle MD   Signed by:   Emerson Monte RN on 02/22/2010   Method used:   [facility]        [name] [address]       [name]       [place], [place]  [address]       Ph: [phone] or [phone]       Fax: [phone]   RxID:   [id number]

## 2010-05-07 NOTE — Miscellaneous (Signed)
Summary: [facility]iac Progress Note   [facility] Progress Note   Imported By: Sallee Provencal 12/24/2009 13:49:40  _____________________________________________________________________  External Attachment:    Type:   Image     Comment:   External Document

## 2010-05-07 NOTE — Assessment & Plan Note (Signed)
Summary: 8wk f/u lower arterial at 11am   Primary Provider:  DR. Rachel Moulds  CC:  swelling in ankles.  History of Present Illness: [name] is seen as a new patient to me.  I believe I saw her a few years ago. for LBBB.  She was hospitalized at the end of February for [place].  Cath by Dr Marigene Ehlers showed severe 2VD involving the LAD and a codominant circ.  She had been noncompliant with her meds especially DM and HTN.  I reviewed her hospital records and cath film  She subsequently had CABG by Dr Cyndia Bent.  Her post op course was bening.  She has F/U with Dr Cyndia Bent and wounds have healed well with no pleural effusion on F/U CXR. She stopped her crestor about a month ago but did not have any changes in some constitutional symptoms and I encouraged her to start taking it again.  She also stopeed her amlodipine.  Since she appears to have increasing LE edema we will not reinstitute it and add a diuretic back instead.  She has PVD with ABI's today that were reviewed.  Pre-CABG abnormal .6 on left.  Today she was .48 on left and .85 on right.  She has primarily right sided sciateca and bilateral back pain.  She denies classic calf claudicatiion and clearly does not have LLE symptoms worse than right.  Given the moderate to severe reduction in ABI on left this would suggest multi level disease.  Will get CTA since duplex was not done and have her F/U with Dr Burt Knack.    Current Problems (verified): 1)  Claudication  (ICD-443.9) 2)  Hyperlipidemia  (ICD-272.4) 3)  CHF  (ICD-428.0) 4)  Hypertension  (ICD-401.9) 5)  Cad  (ICD-414.00) 6)  Shortness of Breath  (ICD-786.05) 7)  Renal Insufficiency  (ICD-588.9) 8)  Gout  (ICD-274.9) 9)  Hypercholesterolemia  (ICD-272.0) 10)  Emphysema  (ICD-492.8) 11)  Asthma  (ICD-493.90) 12)  Dm  (ICD-250.00)  Current Medications (verified): 1)  Metformin Hcl 1000 Mg Tabs (Metformin Hcl) .Marland Kitchen.. 1 Tab By Mouth Two Times A Day 2)  Diovan 320 Mg Tabs (Valsartan) .... One Half  Tablet By Mouth Once Daily 3)  Metoprolol Tartrate 25 Mg Tabs (Metoprolol Tartrate) .... Take One Tablet By Mouth Twice A Day 4)  Onglyza 5 Mg Tabs (Saxagliptin Hcl) .Marland Kitchen.. 1 Tab By Mouth Once Daily 5)  Glimepiride 4 Mg Tabs (Glimepiride) .Marland Kitchen.. 1 Tab By Mouth Two Times A Day 6)  Aspirin Ec 325 Mg Tbec (Aspirin) .... Take One Tablet By Mouth Daily 7)  Magnesium 250 Mg Tabs (Magnesium) .Marland Kitchen.. 1 Tab By Mouth Two Times A Day 8)  Fish Oil   Oil (Fish Oil) .... 2  Tabs By Mouth At Bedtime 9)  Vitamin D3 2000 Unit Caps (Cholecalciferol) .Marland Kitchen.. 1 Tab By Mouth Once Daily 10)  Hydrochlorothiazide 12.5 Mg Tabs (Hydrochlorothiazide) .... Take One Tablet By Mouth Daily.  Allergies (verified): No Known Drug Allergies  Past History:  Past Medical History: Last updated: 06/20/2009 Current Problems:  HYPERLIPIDEMIA (ICD-272.4) CHF (ICD-428.0) HYPERTENSION (ICD-401.9) CAD (ICD-414.00) SHORTNESS OF BREATH (ICD-786.05) RENAL INSUFFICIENCY (ICD-588.9) GOUT (ICD-274.9) HYPERCHOLESTEROLEMIA (ICD-272.0) EMPHYSEMA (ICD-492.8) ASTHMA (ICD-493.90) DM (ICD-250.00)  Past Surgical History: Last updated: 06/20/2009  Median sternotomy, extracorporeal circulation,  coronary artery bypass graft surgery x4 using left internal mammary artery graft to left anterior descending coronary artery, with a  saphenous vein graft to diagonal branch of the LAD, and a sequential saphenous vein graft to first and second obtuse  marginal branches of the   left circumflex coronary artery.  Endoscopic vein harvesting from the  right leg.  Gilford Raid, M.D.  BB/MEDQ  D:  05/28/2009  T:  05/29/2009  Job:  [id number] cc:   Wallis Bamberg. Johnsie Cancel, MD, [facility]    I&D right index finger DIP joint with arthrotomy and synovectomy of the distal interphalangeal joint.  Family History: Last updated: June 21, 2009  Father died with a history of CABG and pneumonia.  Her   mother is alive at age 39 with a history of CABG in her 76s as well as   hypertension,  diabetes.   Social History: Last updated: 06/21/09 She lives with her husband.  They have 1 child and 3   grandchildren.  Her extended family is in and [place], [place].  She   denies tobacco, alcohol, or drug use.  She is not routinely  Review of Systems       Denies fever, malais, weight loss, blurry vision, decreased visual acuity, cough, sputum, SOB, hemoptysis, pleuritic pain, palpitaitons, heartburn, abdominal pain, melena, lower extremity edema, claudication, or rash.   Vital Signs:  Patient profile:   65 year old female Height:      64 inches Weight:      170 pounds BMI:     29.29 Pulse rate:   96 / minute Resp:     14 per minute BP sitting:   154 / 88  (left arm)  Vitals Entered By: Burnett Kanaris (November 29, 2009 4:00 PM)  Physical Exam  General:  Affect appropriate Healthy:  appears stated age 60: normal Neck supple with no adenopathy JVP normal no bruits no thyromegaly Lungs clear with no wheezing and good diaphragmatic motion Heart:  S1/S2 no murmur,rub, gallop or click PMI normal Abdomen: benighn, BS positve, no tenderness, no AAA no bruit.  No HSM or HJR Distal pulses intact with no bruits No edema Neuro non-focal Skin warm and dry Femorals are hard to palpate due to central obesity PT's and popliteals are palpable   Impression & Recommendations:  Problem # 1:  CLAUDICATION (ICD-443.9) CTA with runnoff and F/U Dr Burt Knack.    Problem # 2:  HYPERLIPIDEMIA (B2193296.4) Resume statin F/U labs in 6 months The following medications were removed from the medication list:    Crestor 40 Mg Tabs (Rosuvastatin calcium) .Marland Kitchen... Take one tablet by mouth daily.  Problem # 3:  HYPERTENSION (ICD-401.9) Well will add diuretic and see how she does The following medications were removed from the medication list:    Amlodipine Besylate 5 Mg Tabs (Amlodipine besylate) .Marland Kitchen... Take one tablet by mouth daily Her updated medication list for this problem  includes:    Diovan 320 Mg Tabs (Valsartan) ..... One half tablet by mouth once daily    Metoprolol Tartrate 25 Mg Tabs (Metoprolol tartrate) .Marland Kitchen... Take one tablet by mouth twice a day    Aspirin Ec 325 Mg Tbec (Aspirin) .Marland Kitchen... Take one tablet by mouth daily    Hydrochlorothiazide 12.5 Mg Tabs (Hydrochlorothiazide) .Marland Kitchen... Take one tablet by mouth daily.  Problem # 4:  CAD (ICD-414.00) Stable post CABG The following medications were removed from the medication list:    Amlodipine Besylate 5 Mg Tabs (Amlodipine besylate) .Marland Kitchen... Take one tablet by mouth daily Her updated medication list for this problem includes:    Metoprolol Tartrate 25 Mg Tabs (Metoprolol tartrate) .Marland Kitchen... Take one tablet by mouth twice a day    Aspirin Ec 325 Mg Tbec (Aspirin) .Marland Kitchen... Take one  tablet by mouth daily  Other Orders: Misc. Referral (Misc. Ref) CT Scan  (CT Scan)  Patient Instructions: 1)  Your physician recommends that you schedule a follow-up appointment in: 6 MONTHS 2)  You have been referred to DR COOPER-NEW [facility]- SCHEDULE END OF OCT AFTER CTA 3)  Non-Cardiac CT Angiography (CTA), is a special type of CT scan that uses a computer to produce multi-dimensional views of major blood vessels throughout the body. In CT angiography, a contrast material is injected through an IV to help visualize the blood vessels.CTA OF AORTA WITH RUNOFF TO THE LOWER EXT 4)  SCHEDULE THE FIRST WEEK IN OCT 5)  Your physician has recommended you make the following change in your medication: START HCTZ 12.5MG  ONCE DAILY Prescriptions: HYDROCHLOROTHIAZIDE 12.5 MG TABS (HYDROCHLOROTHIAZIDE) Take one tablet by mouth daily.  #30 x 12   Entered by:   Fredia Beets, RN   Authorized by:   Bosie Clos, MD, [facility]   Signed by:   Fredia Beets, RN on 11/29/2009   Method used:   [facility]        [name]       [name]       [place], [place]  [address]       Ph: [phone] or [phone]       Fax:  [phone]   RxID:   [id number]

## 2010-05-30 ENCOUNTER — Ambulatory Visit: Payer: Self-pay | Admitting: Cardiovascular Disease

## 2010-06-18 LAB — GLUCOSE, CAPILLARY
Glucose-Capillary: 107 mg/dL — ABNORMAL HIGH (ref 70–99)
Glucose-Capillary: 118 mg/dL — ABNORMAL HIGH (ref 70–99)

## 2010-06-23 LAB — GLUCOSE, CAPILLARY: Glucose-Capillary: 176 mg/dL — ABNORMAL HIGH (ref 70–99)

## 2010-06-25 LAB — GLUCOSE, CAPILLARY
Glucose-Capillary: 174 mg/dL — ABNORMAL HIGH (ref 70–99)
Glucose-Capillary: 181 mg/dL — ABNORMAL HIGH (ref 70–99)

## 2010-06-26 LAB — COMPREHENSIVE METABOLIC PANEL
Albumin: 2.7 g/dL — ABNORMAL LOW (ref 3.5–5.2)
Alkaline Phosphatase: 87 U/L (ref 39–117)
BUN: 27 mg/dL — ABNORMAL HIGH (ref 6–23)
CO2: 28 mEq/L (ref 19–32)
Chloride: 103 mEq/L (ref 96–112)
Creatinine, Ser: 1.23 mg/dL — ABNORMAL HIGH (ref 0.4–1.2)
GFR calc non Af Amer: 44 mL/min — ABNORMAL LOW (ref >60)
Potassium: 3.9 mEq/L (ref 3.5–5.1)
Total Bilirubin: 0.7 mg/dL (ref 0.3–1.2)

## 2010-06-26 LAB — LIPID PANEL
HDL: 27 mg/dL — ABNORMAL LOW (ref >39)
HDL: 27 mg/dL — ABNORMAL LOW (ref >39)
Total CHOL/HDL Ratio: 7.4 RATIO
Total CHOL/HDL Ratio: 7.7 RATIO
Triglycerides: 296 mg/dL — ABNORMAL HIGH (ref <150)
VLDL: 59 mg/dL — ABNORMAL HIGH (ref 0–40)

## 2010-06-26 LAB — POCT I-STAT 3, ART BLOOD GAS (G3+)
Acid-base deficit: 1 mmol/L (ref 0.0–2.0)
Acid-base deficit: 4 mmol/L — ABNORMAL HIGH (ref 0.0–2.0)
Bicarbonate: 24.6 mEq/L — ABNORMAL HIGH (ref 20.0–24.0)
Bicarbonate: 25.8 mEq/L — ABNORMAL HIGH (ref 20.0–24.0)
O2 Saturation: 100 %
O2 Saturation: 97 %
Patient temperature: 35.9
TCO2: 26 mmol/L (ref 0–100)
TCO2: 27 mmol/L (ref 0–100)
pCO2 arterial: 38.9 mmHg (ref 35.0–45.0)
pCO2 arterial: 40.1 mmHg (ref 35.0–45.0)
pO2, Arterial: 247 mmHg — ABNORMAL HIGH (ref 80.0–100.0)
pO2, Arterial: 66 mmHg — ABNORMAL LOW (ref 80.0–100.0)
pO2, Arterial: 95 mmHg (ref 80.0–100.0)

## 2010-06-26 LAB — CBC
HCT: 30.5 % — ABNORMAL LOW (ref 36.0–46.0)
HCT: 30.9 % — ABNORMAL LOW (ref 36.0–46.0)
HCT: 38.9 % (ref 36.0–46.0)
Hemoglobin: 10.3 g/dL — ABNORMAL LOW (ref 12.0–15.0)
Hemoglobin: 10.4 g/dL — ABNORMAL LOW (ref 12.0–15.0)
Hemoglobin: 9.6 g/dL — ABNORMAL LOW (ref 12.0–15.0)
Hemoglobin: 9.8 g/dL — ABNORMAL LOW (ref 12.0–15.0)
MCHC: 33.5 g/dL (ref 30.0–36.0)
MCHC: 33.6 g/dL (ref 30.0–36.0)
MCHC: 33.7 g/dL (ref 30.0–36.0)
MCHC: 33.8 g/dL (ref 30.0–36.0)
MCV: 87 fL (ref 78.0–100.0)
MCV: 87.9 fL (ref 78.0–100.0)
MCV: 88 fL (ref 78.0–100.0)
MCV: 88.4 fL (ref 78.0–100.0)
MCV: 89.1 fL (ref 78.0–100.0)
Platelets: 166 10*3/uL (ref 150–400)
Platelets: 167 10*3/uL (ref 150–400)
Platelets: 191 10*3/uL (ref 150–400)
Platelets: 224 10*3/uL (ref 150–400)
Platelets: 240 10*3/uL (ref 150–400)
Platelets: 249 10*3/uL (ref 150–400)
Platelets: 279 10*3/uL (ref 150–400)
RBC: 3.27 MIL/uL — ABNORMAL LOW (ref 3.87–5.11)
RBC: 3.29 MIL/uL — ABNORMAL LOW (ref 3.87–5.11)
RBC: 3.47 MIL/uL — ABNORMAL LOW (ref 3.87–5.11)
RBC: 4.43 MIL/uL (ref 3.87–5.11)
RDW: 13 % (ref 11.5–15.5)
RDW: 13.5 % (ref 11.5–15.5)
RDW: 13.6 % (ref 11.5–15.5)
RDW: 13.7 % (ref 11.5–15.5)
RDW: 13.8 % (ref 11.5–15.5)
WBC: 10.6 10*3/uL — ABNORMAL HIGH (ref 4.0–10.5)
WBC: 10.9 10*3/uL — ABNORMAL HIGH (ref 4.0–10.5)
WBC: 11.9 10*3/uL — ABNORMAL HIGH (ref 4.0–10.5)
WBC: 12.5 10*3/uL — ABNORMAL HIGH (ref 4.0–10.5)
WBC: 12.6 10*3/uL — ABNORMAL HIGH (ref 4.0–10.5)
WBC: 13.5 10*3/uL — ABNORMAL HIGH (ref 4.0–10.5)
WBC: 16.3 10*3/uL — ABNORMAL HIGH (ref 4.0–10.5)

## 2010-06-26 LAB — GLUCOSE, CAPILLARY
Glucose-Capillary: 113 mg/dL — ABNORMAL HIGH (ref 70–99)
Glucose-Capillary: 116 mg/dL — ABNORMAL HIGH (ref 70–99)
Glucose-Capillary: 122 mg/dL — ABNORMAL HIGH (ref 70–99)
Glucose-Capillary: 128 mg/dL — ABNORMAL HIGH (ref 70–99)
Glucose-Capillary: 150 mg/dL — ABNORMAL HIGH (ref 70–99)
Glucose-Capillary: 163 mg/dL — ABNORMAL HIGH (ref 70–99)
Glucose-Capillary: 163 mg/dL — ABNORMAL HIGH (ref 70–99)
Glucose-Capillary: 166 mg/dL — ABNORMAL HIGH (ref 70–99)
Glucose-Capillary: 177 mg/dL — ABNORMAL HIGH (ref 70–99)
Glucose-Capillary: 183 mg/dL — ABNORMAL HIGH (ref 70–99)
Glucose-Capillary: 185 mg/dL — ABNORMAL HIGH (ref 70–99)
Glucose-Capillary: 189 mg/dL — ABNORMAL HIGH (ref 70–99)
Glucose-Capillary: 203 mg/dL — ABNORMAL HIGH (ref 70–99)
Glucose-Capillary: 204 mg/dL — ABNORMAL HIGH (ref 70–99)
Glucose-Capillary: 207 mg/dL — ABNORMAL HIGH (ref 70–99)
Glucose-Capillary: 226 mg/dL — ABNORMAL HIGH (ref 70–99)
Glucose-Capillary: 234 mg/dL — ABNORMAL HIGH (ref 70–99)
Glucose-Capillary: 74 mg/dL (ref 70–99)
Glucose-Capillary: 86 mg/dL (ref 70–99)

## 2010-06-26 LAB — PLATELET COUNT: Platelets: 183 10*3/uL (ref 150–400)

## 2010-06-26 LAB — BLOOD GAS, ARTERIAL
FIO2: 21 %
Patient temperature: 98.6
pCO2 arterial: 39.2 mmHg (ref 35.0–45.0)
pH, Arterial: 7.454 — ABNORMAL HIGH (ref 7.350–7.400)

## 2010-06-26 LAB — POCT I-STAT 4, (NA,K, GLUC, HGB,HCT)
Glucose, Bld: 134 mg/dL — ABNORMAL HIGH (ref 70–99)
Glucose, Bld: 177 mg/dL — ABNORMAL HIGH (ref 70–99)
Glucose, Bld: 195 mg/dL — ABNORMAL HIGH (ref 70–99)
HCT: 25 % — ABNORMAL LOW (ref 36.0–46.0)
HCT: 27 % — ABNORMAL LOW (ref 36.0–46.0)
HCT: 35 % — ABNORMAL LOW (ref 36.0–46.0)
Hemoglobin: 11.9 g/dL — ABNORMAL LOW (ref 12.0–15.0)
Hemoglobin: 8.5 g/dL — ABNORMAL LOW (ref 12.0–15.0)
Hemoglobin: 9.2 g/dL — ABNORMAL LOW (ref 12.0–15.0)
Potassium: 3.6 mEq/L (ref 3.5–5.1)
Potassium: 4.4 mEq/L (ref 3.5–5.1)
Sodium: 136 mEq/L (ref 135–145)
Sodium: 138 mEq/L (ref 135–145)
Sodium: 140 mEq/L (ref 135–145)
Sodium: 141 mEq/L (ref 135–145)

## 2010-06-26 LAB — BASIC METABOLIC PANEL
BUN: 18 mg/dL (ref 6–23)
BUN: 19 mg/dL (ref 6–23)
BUN: 25 mg/dL — ABNORMAL HIGH (ref 6–23)
BUN: 31 mg/dL — ABNORMAL HIGH (ref 6–23)
BUN: 33 mg/dL — ABNORMAL HIGH (ref 6–23)
CO2: 25 mEq/L (ref 19–32)
CO2: 27 mEq/L (ref 19–32)
CO2: 28 mEq/L (ref 19–32)
CO2: 28 mEq/L (ref 19–32)
Calcium: 7.5 mg/dL — ABNORMAL LOW (ref 8.4–10.5)
Calcium: 7.6 mg/dL — ABNORMAL LOW (ref 8.4–10.5)
Calcium: 8.1 mg/dL — ABNORMAL LOW (ref 8.4–10.5)
Calcium: 8.2 mg/dL — ABNORMAL LOW (ref 8.4–10.5)
Chloride: 104 mEq/L (ref 96–112)
Chloride: 105 mEq/L (ref 96–112)
Chloride: 105 mEq/L (ref 96–112)
Chloride: 106 mEq/L (ref 96–112)
Chloride: 107 mEq/L (ref 96–112)
Creatinine, Ser: 0.95 mg/dL (ref 0.4–1.2)
Creatinine, Ser: 1 mg/dL (ref 0.4–1.2)
Creatinine, Ser: 1.04 mg/dL (ref 0.4–1.2)
Creatinine, Ser: 1.11 mg/dL (ref 0.4–1.2)
Creatinine, Ser: 1.11 mg/dL (ref 0.4–1.2)
GFR calc Af Amer: >60 mL/min (ref >60)
GFR calc Af Amer: >60 mL/min (ref >60)
GFR calc Af Amer: >60 mL/min (ref >60)
GFR calc non Af Amer: 31 mL/min — ABNORMAL LOW (ref >60)
GFR calc non Af Amer: 50 mL/min — ABNORMAL LOW (ref >60)
GFR calc non Af Amer: 56 mL/min — ABNORMAL LOW (ref >60)
GFR calc non Af Amer: 59 mL/min — ABNORMAL LOW (ref >60)
Glucose, Bld: 111 mg/dL — ABNORMAL HIGH (ref 70–99)
Glucose, Bld: 132 mg/dL — ABNORMAL HIGH (ref 70–99)
Glucose, Bld: 140 mg/dL — ABNORMAL HIGH (ref 70–99)
Glucose, Bld: 179 mg/dL — ABNORMAL HIGH (ref 70–99)
Glucose, Bld: 220 mg/dL — ABNORMAL HIGH (ref 70–99)
Potassium: 3.8 mEq/L (ref 3.5–5.1)
Potassium: 4 mEq/L (ref 3.5–5.1)
Potassium: 4.5 mEq/L (ref 3.5–5.1)
Potassium: 4.7 mEq/L (ref 3.5–5.1)
Sodium: 134 mEq/L — ABNORMAL LOW (ref 135–145)
Sodium: 136 mEq/L (ref 135–145)
Sodium: 138 mEq/L (ref 135–145)

## 2010-06-26 LAB — POCT I-STAT, CHEM 8
BUN: 26 mg/dL — ABNORMAL HIGH (ref 6–23)
Calcium, Ion: 1.11 mmol/L — ABNORMAL LOW (ref 1.12–1.32)
Calcium, Ion: 1.18 mmol/L (ref 1.12–1.32)
Chloride: 105 mEq/L (ref 96–112)
Creatinine, Ser: 0.8 mg/dL (ref 0.4–1.2)
Glucose, Bld: 145 mg/dL — ABNORMAL HIGH (ref 70–99)
HCT: 30 % — ABNORMAL LOW (ref 36.0–46.0)
Hemoglobin: 10.2 g/dL — ABNORMAL LOW (ref 12.0–15.0)
Potassium: 4 mEq/L (ref 3.5–5.1)
TCO2: 22 mmol/L (ref 0–100)
TCO2: 27 mmol/L (ref 0–100)

## 2010-06-26 LAB — URINALYSIS, MICROSCOPIC ONLY
Ketones, ur: NEGATIVE mg/dL
Leukocytes, UA: NEGATIVE
Nitrite: NEGATIVE
pH: 5 (ref 5.0–8.0)

## 2010-06-26 LAB — MAGNESIUM: Magnesium: 2.7 mg/dL — ABNORMAL HIGH (ref 1.5–2.5)

## 2010-06-26 LAB — CREATININE, SERUM
Creatinine, Ser: 0.77 mg/dL (ref 0.4–1.2)
GFR calc Af Amer: >60 mL/min (ref >60)
GFR calc non Af Amer: >60 mL/min (ref >60)

## 2010-06-26 LAB — URINE CULTURE
Colony Count: NO GROWTH
Culture: NO GROWTH

## 2010-06-26 LAB — CARDIAC PANEL(CRET KIN+CKTOT+MB+TROPI)
CK, MB: 4.1 ng/mL — ABNORMAL HIGH (ref 0.3–4.0)
Relative Index: 3.8 — ABNORMAL HIGH (ref 0.0–2.5)
Relative Index: 3.8 — ABNORMAL HIGH (ref 0.0–2.5)
Troponin I: 6.38 ng/mL (ref 0.00–0.06)

## 2010-06-26 LAB — URINE MICROSCOPIC-ADD ON

## 2010-06-26 LAB — URINALYSIS, ROUTINE W REFLEX MICROSCOPIC
Bilirubin Urine: NEGATIVE
Protein, ur: 100 mg/dL — AB
Urobilinogen, UA: 0.2 mg/dL (ref 0.0–1.0)

## 2010-06-26 LAB — POCT CARDIAC MARKERS
Myoglobin, poc: 217 ng/mL (ref 12–200)
Troponin i, poc: 2.39 ng/mL (ref 0.00–0.09)

## 2010-06-26 LAB — DIFFERENTIAL
Eosinophils Relative: 1 % (ref 0–5)
Lymphs Abs: 2.4 10*3/uL (ref 0.7–4.0)
Monocytes Absolute: 0.9 10*3/uL (ref 0.1–1.0)
Neutro Abs: 9.1 10*3/uL — ABNORMAL HIGH (ref 1.7–7.7)
Neutrophils Relative %: 72 % (ref 43–77)

## 2010-06-26 LAB — PROTIME-INR
INR: 1.1 (ref 0.00–1.49)
INR: 1.44 (ref 0.00–1.49)
Prothrombin Time: 14.1 seconds (ref 11.6–15.2)
Prothrombin Time: 17.4 seconds — ABNORMAL HIGH (ref 11.6–15.2)

## 2010-06-26 LAB — MRSA PCR SCREENING

## 2010-06-26 LAB — TYPE AND SCREEN: Antibody Screen: NEGATIVE

## 2010-06-26 LAB — CK TOTAL AND CKMB (NOT AT ARMC)
CK, MB: 8.7 ng/mL (ref 0.3–4.0)
Total CK: 199 U/L — ABNORMAL HIGH (ref 7–177)

## 2010-06-26 LAB — HEMOGLOBIN AND HEMATOCRIT, BLOOD
HCT: 26 % — ABNORMAL LOW (ref 36.0–46.0)
Hemoglobin: 8.8 g/dL — ABNORMAL LOW (ref 12.0–15.0)

## 2010-06-26 LAB — HEMOGLOBIN A1C: Mean Plasma Glucose: 341 mg/dL

## 2010-06-26 LAB — APTT: aPTT: 34 seconds (ref 24–37)

## 2010-07-10 ENCOUNTER — Ambulatory Visit (HOSPITAL_BASED_OUTPATIENT_CLINIC_OR_DEPARTMENT_OTHER)
Admission: RE | Admit: 2010-07-10 | Discharge: 2010-07-10 | Disposition: A | Discharge status: Home / Self Care | Payer: 59 | Source: Ambulatory Visit | Attending: Specialist | Admitting: Specialist

## 2010-07-10 DIAGNOSIS — I2510 Atherosclerotic heart disease of native coronary artery without angina pectoris: Secondary | ICD-10-CM | POA: Insufficient documentation

## 2010-07-10 DIAGNOSIS — Z951 Presence of aortocoronary bypass graft: Secondary | ICD-10-CM | POA: Insufficient documentation

## 2010-07-10 DIAGNOSIS — Z79899 Other long term (current) drug therapy: Secondary | ICD-10-CM | POA: Insufficient documentation

## 2010-07-10 DIAGNOSIS — Z01812 Encounter for preprocedural laboratory examination: Secondary | ICD-10-CM | POA: Insufficient documentation

## 2010-07-10 DIAGNOSIS — H269 Unspecified cataract: Secondary | ICD-10-CM | POA: Insufficient documentation

## 2010-07-10 DIAGNOSIS — I10 Essential (primary) hypertension: Secondary | ICD-10-CM | POA: Insufficient documentation

## 2010-07-10 DIAGNOSIS — E119 Type 2 diabetes mellitus without complications: Secondary | ICD-10-CM | POA: Insufficient documentation

## 2010-07-10 LAB — POCT I-STAT 4, (NA,K, GLUC, HGB,HCT)
Potassium: 4.4 mEq/L (ref 3.5–5.1)
Sodium: 140 mEq/L (ref 135–145)

## 2010-08-20 NOTE — Assessment & Plan Note (Signed)
OFFICE VISIT   [name], [name]  DOB:  03/03/1946                                        June 19, 2009  CHART #:  [id number]   HISTORY:  The patient returned to my office today for followup status  post coronary artery bypass graft surgery on May 28, 2009.  She has  been feeling well overall and is walking daily without chest pain or  shortness of breath.  She brought a record of her blood glucoses from  home since discharge and they have been consistently 180-250.  She was  sent home on Lantus 32 units nightly and metformin 500 mg b.i.d.  She  had a markedly elevated hemoglobin A1c at the time of admission and said  that she had been weaning herself off her oral hypoglycemics.  She  reports that prior to weaning herself off her oral hypoglycemics, she  had very good blood sugar control with maintaining in the 100-120 range  consistently.  She had been on metformin, Amaryl, and Onglyza.   PHYSICAL EXAMINATION:  Today, her blood pressure is 147/79, her pulse is  79 and regular, respiratory rate is 18 and unlabored.  Oxygen saturation  on room air is 95%.  She looks well.  Cardiac exam shows a regular rate  and rhythm with normal heart sounds.  Her lung exam is clear.  Chest  incision is healing well and the sternum is stable.  Her leg incision is  healing well and there is no peripheral edema.   Chest x-ray today shows clear lung fields and no pleural effusions.   MEDICATIONS:  1. Aspirin 325 mg daily.  2. Lantus insulin 32 units nightly.  3. Metformin 500 mg b.i.d.  4. Toprol 25 mg b.i.d.  5. Oxycodone 5 mg p.r.n. for pain.  6. Crestor 40 mg nightly.  7. Diovan 80 mg daily.  8. Fish oil daily.  9. Multivitamin daily.  10.Vitamin D3 2000 units daily.   IMPRESSION:  The patient appears to be making a good recovery following  coronary bypass graft surgery.  She feels much better.  Her diabetes is  still suboptimally controlled on the current  regimen.  She is anxious to  try her oral hypoglycemics again.  I have agreed to restart her on her  previous drugs.  I wrote her a prescription for Amaryl 4 mg b.i.d.,  Onglyza 2.5 mg daily, and metformin 500 mg b.i.d.  I instructed her to  discontinue her Lantus.  She will continue checking her blood sugars 3  times daily.  I told her that it is possible that she will not be able  to be controlled adequately on oral hypoglycemic agents.  She may  require reinstitution of insulin therapy.  Lantus at bedtime was  obviously not controlling her well enough.  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  She is going to follow up with her primary physician Dr. Rachel Moulds  and Kelby Aline, PA-C concerning her diabetes.  She will follow up  with Dr. Jenkins Rouge for her cardiology care.  She will return to see  me if she develops any problems with her incisions.   Gilford Raid,  M.D.  Electronically Signed   BB/MEDQ  D:  06/19/2009  T:  06/20/2009  Job:  [id number]   cc:   Wallis Bamberg. Johnsie Cancel, MD, [facility]  Darcus Austin, D.O.

## 2010-08-20 NOTE — Assessment & Plan Note (Signed)
[facility]                            CARDIOLOGY OFFICE NOTE   [name], [name]                        MRN:          [id number]  DATE:12/06/2007                            DOB:          [birth year]    A 65 year old patient referred for an abnormal EKG, multiple coronary  risk factors.  In talking to the patient, she has really not taken her  risk factors seriously for about a year and half.  She thought she could  take care of her hypertension, hypercholesterolemia, and diabetes  without medications.  She is always concerned about the side effects,  particularly of statin drugs.   The patient has had mild exertional dyspnea.  It sounds more like  deconditioning, although I cannot rule out an anginal equivalent.  She  has not had PND or orthopnea.  No palpitations or chest pain.  She had a  stress test here in our office back in 2005, which was normal.   Her risk factors are fairly horrendous.  She had a hemoglobin A1c in  excess of 10.  She had triglycerides greater than 400.  Her LDL could  not be calculated due to the fatty nature of her serum.  She has  significant hypertension.  However, a lot of her hypertension does  appear to be white coat.  She had home blood pressure readings, which  seemed to run anywhere from [id number] systolic.   She has recently been compliant with her blood pressure and sugar pills.  She is currently not on the statin drug.  She had previously been on  Vytorin with reasonable results, but stopped it due to concern of side  effects.  I explained to her that given her risk factors that she needed  be back on drug, I will let Dr. Johnnye Sima address this and probably  resume her Vytorin.   She would need frequent CPKs and LFTs to make sure there are no side  effects since she is concerned about this.   The patient's past medical history is remarkable for hypertension,  hyperlipidemia, diabetes, lower back pain, gout  with an I&D of the right  index finger, and previous cataract surgery.   She is allergic to ATENOLOL, which exacerbates her gout.   Family history is remarkable for hypertension.  Mother is alive at age  70.  She is hypertensive and diabetic.  Father died at age 16 of a  pneumonia.   The patient is happily married.  She has 1 son, who lives in [place]  and runs a coffee shop in an apartment complex.  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  This has been very  stressful in the down economy.   She does not smoke or drink.  She does not exercise.   Her medications include:  1. Diovan 320 a day.  2. Metformin 500 t.i.d.  3. Amaryl 2 mg 2 tablets b.i.d.  4. Atenolol 50 b.i.d.  5. Fenoglide 120 mg daily.  6. Lisinopril 20 a day.  7. Hydrochlorothiazide 25 a day.  8. Vitamin  D.  9. Aspirin.   PHYSICAL EXAMINATION:  GENERAL:  Her exam is remarkable for middle-aged  white female in no distress.  VITAL SIGNS:  Weight 162, blood pressure 148/88, pulse 70 and regular,  respiratory rate 14, and afebrile.  HEENT:  Unremarkable.  NECK:  Carotids without bruit.  No lymphadenopathy, thyromegaly, or JVP  elevation.  LUNGS:  Clear diaphragmatic motion.  No wheezing.  HEART:  S1, S2 with normal heart sounds. PMI normal.  ABDOMEN:  Benign.  Bowel sounds positive.  No AAA.  No tenderness.  No  bruit.  No hepatosplenomegaly.  No hepatojugular reflux.  EXTREMITIES:  Distal pulses intact. No edema.  NEUROLOGIC:  Nonfocal.  SKIN:  Warm and dry.  No muscular weakness.   As indicated, her lab work was reviewed.  Her creatinine is 0.93.  Her  blood sugar was 188.  Her HDL was 35, triglycerides were 509.  Hemoglobin A1c was 10.4.  LFTs were normal.   Her EKG shows sinus rhythm with poor R wave progression and an  intraventricular conduction delay.   IMPRESSION:  1. Abnormal EKG in the setting of diabetes and some exertional      dyspnea.  Followup stress Myoview,  rule out coronary disease.  EKG      changes, likely due to hypertension.  2. Hypertension.  Continue current dose of ARB, Ace, diuretic, and      beta-blocker, bit of white coat syndrome, home readings seemed      fine.  Low-salt diet.  3. Diabetes out of control.  The patient should be referred to a      nutritionist.  I talked to her at length about checking her sugars      3-4 times a day including 2-hour postprandial sugars.  I think      initially a target hemoglobin A1c of less than 7 would be in order      and after a year's time less than 6.5.  She understands the need to      take her pills, but I suspect if she does not take it more      seriously, she will start needing a dose of Lantus at night.  4. Hypercholesterolemia.  Resume Vytorin.  Check liver profile in 6-8      weeks.  The patient has high concern for side effects.  5. History of gout.  Try to avoid any higher dose of diuretics.  6. History of cataracts.  Follow up ophthalmologist, should have her      retina checked in regards to her diabetes.   The patient will have her Myoview.  As long as it is nonischemic, we  will see her on an as-needed basis and she will continue to have her  risk factors modified by primary care.     Wallis Bamberg. Johnsie Cancel, MD, [facility]  Electronically Signed    PCN/MedQ  DD: 12/06/2007  DT: 12/07/2007  Job #: [id number]   cc:   Darcus Austin, D.O.

## 2010-08-23 NOTE — Op Note (Signed)
NAMEBEONCA, NEWSUM               ACCOUNT NO.:  [id number]   MEDICAL RECORD NO.:  [id number]          PATIENT TYPE:  INP   LOCATION:  5009                         FACILITY:  [facility]   PHYSICIAN:  Satira Anis. 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   POSTOPERATIVE DIAGNOSIS:  Infection distal interphalangeal joint with  superimposed gouty arthritis about the distal interphalangeal joint noted as  well.   SURGICAL PROCEDURE PERFORMED:  I&D right index finger DIP joint with  arthrotomy and synovectomy of the distal interphalangeal joint.   SURGEON:  Satira Anis. Amedeo Plenty, M.D.   ASSISTANTS:  None.   COMPLICATIONS:  None.   ANESTHESIA:  General.   CULTURES:  Bone and soft tissue cultures were taken.   SPECIMENS:  The patient had tissue sent for gouty identification and bone  specimen sent to pathology.   ESTIMATED BLOOD LOSS:  Minimal.   COMPLICATIONS:  None.   INDICATIONS FOR THE PROCEDURE:  Ms. Thielke is a 65 year old female who  presents with the above-mentioned diagnosis.  I have counseled her in  regards to the risks and benefits of surgery including the risks of  infection, bleeding, anesthesia, damage to normal structures and failure of  the surgery.  To accomplish this, the attempted goal is relieving the  symptoms and restoring function.   This patient is 65 years of age.  She noted a possible  laceration to the  DIP joint region on July 06, 2005.  Subsequently was seen at the urgent care  and placed on doxycycline and rifampin.  This was done by Dr. Margart Sickles on  approximately April 8.  She was on this regime from April 8 until April 13  when she saw Dr. Rachel Moulds, who changed the antibiotic regime to  Levaquin which she has been on until yesterday.   She has also had a dose of  Rocephin and has been evaluated at the urgent care multiple times throughout  the month of April.  She presented to the emergency room due to the  continuing worsening in her status.  She does have history of diabetes and  hypertension that she is aware of.  I have counseled the patient in regards  to her findings.  At the present time, she has a swollen, edematous and  somewhat tense distal interphalangeal joint with erythema.  She does not  have Kanavel's sign.  I have gone over all risks and benefits with her and  with all of these in mind, we plan for I&D.   OPERATION IN DETAIL:  The patient was __________ taken to the operating  suite __________ prepped and draped in the usual sterile fashion about the  right upper extremity. Sterile field  was secured and a curvilinear incision  was made over the dorsal DIP joint.  I dissected down and made full-  thickness flaps off the subcutaneous tissue.  Once this was  done, I did  encounter some infectious fluid.  This infectious fluid was cultured for  aerobic and anaerobic culture.  Following this, I was immediately aware of  gouty crystals and performed a debridement of the gouty crystals and sent  this for gouty specimen in alcohol per routine.  Following this, I then  performed a very careful and meticulous dissection.  I was able to open up  the DIP joint, perform arthrotomy and perform a meticulous synovectomy.  I  took great care to preserve the extensor tendon apparatus which was apparent  and very visible at all times.  Synovectomy, arthrotomy and debridement of  the joint was accomplished.  I debrided the infectious-type tissue and sent  this as well as bone for culture.  That is fluid and tissue as well as bone  culture was sent.  Gouty crystals were sent and a specimen for pathology of  the bone was sent.  I curettaged the lytic areas.  The patient tolerated  this well.  Once this was done, I then placed  greater than two liters of  saline through the area and copiously irrigated the wounds.  Following this,  I loosely closed the wound over a vessel loop drain.  Refill was established  nicely.  A brief Penrose drain was placed for tourniquet during a portion of  the procedure.   It is my impression this patient has an infectious gouty arthritic episode.  I have noted that she did not make [name] aware of any prior gouty history.  We  will look into this in further detail.  Postoperatively, we are going to  plan for vancomycin and IV Unasyn until cultures are final.  I have  discussed with the patient the do's and don't's and treatment for possible  osteomyelitis if the cultures are positive, although they will be skewed due  to her prior antibiotic regime.  I will also treat her for gouty  exacerbation accordingly.  She will be admitted to the hospital.  I have  discussed with __________.  All questions have been encouraged and answered.  She awoke from anesthesia comfortably and there were no complicating  features.           ______________________________  Satira Anis. Blanchie Dessert, M.D.     Sampson Si  D:  08/07/2005  T:  08/08/2005  Job:  [id number]   cc:   Darcus Austin, D.O.  Fax: [phone]   Margart Sickles, Dr.

## 2010-08-23 NOTE — Discharge Summary (Signed)
[name], [name]               ACCOUNT NO.:  [id number]   MEDICAL RECORD NO.:  [id number]          PATIENT TYPE:  INP   LOCATION:  5009                         FACILITY:  [facility]   PHYSICIAN:  Avelina Laine, P.A.-C.DATE OF BIRTH:  04/17/1945   DATE OF ADMISSION:  08/07/2005  DATE OF DISCHARGE:  08/10/2005                                 DISCHARGE SUMMARY   ADMITTING DIAGNOSES:  1.  Right index finger infection.  2.  History of diabetes mellitus.  3.  History of hypertension.  4.  History of asthma.  5.  History of emphysema.   SURGEONS:  Satira Anis. Amedeo Plenty, M.D.   CONSULTS:  None.   BRIEF HISTORY:  Ms. Brett is a 65 year old female who presented to the  [facility] Emergency Room for evaluation of her right index finger secondary  to pain and soft tissue swelling. She had a somewhat questionable chronic  infection to the right index finger, as she was seen the first week of April  at a local urgent care for possible infection.  She was placed on  doxycycline at that time and rifampin, which she took throughout the first 2  weeks of April.  She did not have a great deal of improvement, was  subsequently seen by Dr. Rachel Moulds, who switched her antibiotic regime  to Levaquin.  She remained on this medicine from July 18, 2005 until Aug 06, 2005.  She returned to Urgent Care today, secondary to increased soft tissue  swelling, erythema, and pain, and subsequently to the emergency room for  referral and evaluation by hand surgeon on call, Dr. Satira Anis. Gramig.  She  was noted to have reddened, erythematous tissue present about the distal tip  of the index finger.  She was noted to have some slight thinning of the  skin, no __________, no sinus tract-type infection.  X-rays showed a lytic  lesion at the DIP, as well as degenerative joint disease.  This was  discussed with the patient at length, and decision was made to proceed with  I&D, antibiotic treatment, for infectious  process.  The patient underwent  the procedure, her hospital course, Ms. Alamilla was admitted on Aug 07, 2005.  Underwent I&D about the DIP.  She was noted to have a fair amount of  degenerative changes.  In addition, she did have a gouty-type appearance.  Please see operative report for full details.  The following day she was  admitted to standard orthopedic orders, including IV vancomycin and Unasyn  for coverage.  She had no worsening of her symptoms.  On Aug 09, 2005, she  was afebrile, vital signs were stable.  She was doing very well.  Cultures  showed no growth to date, and on May 5, she was stable without difficulties.  Decision made to send her home on May 6, prophylactic coverage of chronic  infection, rule out osteo as well as gouty coverage, including allopurinol.  She was discharged home in stable condition.   ASSESSMENT/FINAL DIAGNOSES:  1.  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  2.  Gouty arthropathy, the right index finger.  PLAN:  Condition on discharge stable.   ACTIVITY:  She will keep her dressing clean, dry, intact.  She will return  to the office on Wednesday for further evaluation.   DISCHARGE MEDICATIONS WILL INCLUDE:  1.  Clindamycin 300 mg q.i.d. for 6 weeks.  2.  Vicodin 1 to 2 p.o. q.4-6 p.r.n. pain.  3.  Colchicine 0.6 mg p.o. b.i.d. x1 month.  4.  Allopurinol 300 mg p.o. daily.   All questions encouraged and answered at her [facility].      Avelina Laine, P.A.-C.     BB/MEDQ  D:  11/02/2005  T:  11/02/2005  Job:  [id number]

## 2010-10-08 ENCOUNTER — Other Ambulatory Visit (HOSPITAL_COMMUNITY): Payer: Self-pay | Admitting: Internal Medicine

## 2010-10-08 ENCOUNTER — Ambulatory Visit (HOSPITAL_COMMUNITY)
Admission: RE | Admit: 2010-10-08 | Discharge: 2010-10-08 | Disposition: A | Discharge status: Home / Self Care | Payer: 59 | Source: Ambulatory Visit | Attending: Internal Medicine | Admitting: Internal Medicine

## 2010-10-08 DIAGNOSIS — L97509 Non-pressure chronic ulcer of other part of unspecified foot with unspecified severity: Secondary | ICD-10-CM | POA: Insufficient documentation

## 2010-10-08 DIAGNOSIS — M79609 Pain in unspecified limb: Secondary | ICD-10-CM

## 2010-10-15 ENCOUNTER — Encounter (HOSPITAL_BASED_OUTPATIENT_CLINIC_OR_DEPARTMENT_OTHER): Payer: 59 | Attending: General Surgery

## 2010-10-15 DIAGNOSIS — I2510 Atherosclerotic heart disease of native coronary artery without angina pectoris: Secondary | ICD-10-CM | POA: Insufficient documentation

## 2010-10-15 DIAGNOSIS — Z794 Long term (current) use of insulin: Secondary | ICD-10-CM | POA: Insufficient documentation

## 2010-10-15 DIAGNOSIS — E1169 Type 2 diabetes mellitus with other specified complication: Secondary | ICD-10-CM | POA: Insufficient documentation

## 2010-10-15 DIAGNOSIS — Z79899 Other long term (current) drug therapy: Secondary | ICD-10-CM | POA: Insufficient documentation

## 2010-10-15 DIAGNOSIS — G589 Mononeuropathy, unspecified: Secondary | ICD-10-CM | POA: Insufficient documentation

## 2010-10-15 DIAGNOSIS — L97509 Non-pressure chronic ulcer of other part of unspecified foot with unspecified severity: Secondary | ICD-10-CM | POA: Insufficient documentation

## 2010-10-15 DIAGNOSIS — Z951 Presence of aortocoronary bypass graft: Secondary | ICD-10-CM | POA: Insufficient documentation

## 2010-10-15 DIAGNOSIS — I10 Essential (primary) hypertension: Secondary | ICD-10-CM | POA: Insufficient documentation

## 2010-10-17 ENCOUNTER — Other Ambulatory Visit: Payer: Self-pay | Admitting: *Deleted

## 2010-10-17 ENCOUNTER — Ambulatory Visit (INDEPENDENT_AMBULATORY_CARE_PROVIDER_SITE_OTHER): Payer: 59 | Admitting: Cardiovascular Disease

## 2010-10-17 ENCOUNTER — Encounter: Payer: Self-pay | Admitting: Cardiovascular Disease

## 2010-10-17 VITALS — BP 166/90 | HR 75 | Ht 64.5 in | Wt 168.0 lb

## 2010-10-17 DIAGNOSIS — I739 Peripheral vascular disease, unspecified: Secondary | ICD-10-CM

## 2010-10-17 NOTE — Progress Notes (Signed)
History of Present Illness:64 yo female with history of CAD s/p CABG per Dr. Cyndia Bent in February 2011, HTN, DM, LBBB, known PAD who is here today as a new PV patient. She has been seen by Dr. Johnsie Cancel for her cardiac issues. Apparently, she has had previous PAD with reduced ABI of 0.48 on the left and 0.85 on the right in 2011. Dr. Johnsie Cancel recommended a CTA and f/u in [facility] clinic but this was cancelled. She is now referred to me by the [place] for evaluation of lower extremity arterial disease with left foot ulcer. She is being followed in the wound center for this. No fever, chills, or signs of infection. This has been present for one month. She has no claudication in her legs. No rest pain.   Past Medical History  Diagnosis Date  . Hypertension   . Coronary artery disease   . Neuropathy   . Diabetes mellitus     Past Surgical History  Procedure Date  . Cataract extraction   . Finger surgery   . Coronary artery bypass graft 2011    Current Outpatient Prescriptions  Medication Sig Dispense Refill  . aspirin 325 MG tablet Take 325 mg by mouth daily.        . Cholecalciferol (VITAMIN D) 2000 UNITS tablet Take 2,000 Units by mouth daily.        . fish oil-omega-3 fatty acids 1000 MG capsule Take 2 g by mouth daily.        Marland Kitchen glimepiride (AMARYL) 4 MG tablet Take 4 mg by mouth. As needed       . hydrochlorothiazide (,MICROZIDE/HYDRODIURIL,) 12.5 MG capsule Take 12.5 mg by mouth daily. As needed       . insulin glargine (LANTUS) 100 UNIT/ML injection Inject 10 Units into the skin at bedtime.       . Magnesium 500 MG TABS Take by mouth. daily       . Melatonin 5 MG CAPS Take by mouth.        . metFORMIN (GLUMETZA) 500 MG (MOD) 24 hr tablet Take 500 mg by mouth 2 (two) times daily with a meal.        . metoprolol tartrate (LOPRESSOR) 25 MG tablet Take 25 mg by mouth 2 (two) times daily.        . rosuvastatin (CRESTOR) 40 MG tablet Take 40 mg by mouth daily.        . valsartan (DIOVAN) 160 MG  tablet Take 160 mg by mouth daily.          No Known Allergies  History   Social History  . Marital Status: Married    Spouse Name: N/A    Number of Children: N/A  . Years of Education: N/A   Occupational History  . Not on file.   Social History Main Topics  . Smoking status: Never Smoker   . Smokeless tobacco: Not on file  . Alcohol Use: No  . Drug Use: Not on file  . Sexually Active: Not on file   Other Topics Concern  . Not on file   Social History Narrative  . No narrative on file    No family history on file.  Review of Systems:  As stated in the HPI and otherwise negative.   BP 166/90  Pulse 75  Ht 5' 4.5" (1.638 m)  Wt 168 lb (76.204 kg)  BMI 28.39 kg/m2  Physical Examination: General: Well developed, well nourished, NAD HEENT: OP clear, mucus membranes moist  SKIN: warm, dry. No rashes. Neuro: No focal deficits Musculoskeletal: Muscle strength 5/5 all ext Psychiatric: Mood and affect normal Neck: No JVD, no carotid bruits, no thyromegaly, no lymphadenopathy. Lungs:Clear bilaterally, no wheezes, rhonci, crackles Cardiovascular: Regular rate and rhythm. No murmurs, gallops or rubs. Abdomen:Soft. Bowel sounds present. Non-tender.  Extremities: No lower extremity edema. Pulses are 2 + in the bilateral DP/PT.  EKG:NSR, rate 75 bpm. LBBB.

## 2010-10-17 NOTE — Assessment & Plan Note (Signed)
Non-healing ulcer left foot. Will arrange full lower extremity arterial dopplers for next week. Will review these and plan angiogram afterwards. The dopplers will help guide therapy.

## 2010-10-17 NOTE — Patient Instructions (Signed)
Your physician recommends that you schedule a follow-up appointment in: 4 weeks with Dr. Angelena Form.  Keep scheduled appointments for tests on October 28, 2010.

## 2010-10-26 ENCOUNTER — Other Ambulatory Visit: Payer: Self-pay | Admitting: Cardiovascular Disease

## 2010-10-28 ENCOUNTER — Encounter: Payer: 59 | Admitting: Cardiology

## 2010-10-28 ENCOUNTER — Encounter (INDEPENDENT_AMBULATORY_CARE_PROVIDER_SITE_OTHER): Payer: 59 | Admitting: Cardiology

## 2010-10-28 ENCOUNTER — Other Ambulatory Visit (HOSPITAL_COMMUNITY): Payer: 59 | Admitting: Radiology

## 2010-10-28 DIAGNOSIS — I739 Peripheral vascular disease, unspecified: Secondary | ICD-10-CM

## 2010-10-30 ENCOUNTER — Encounter: Payer: Self-pay | Admitting: Cardiovascular Disease

## 2010-10-30 NOTE — Telephone Encounter (Signed)
crestor 40 mg. [facility] mail order. [phone].

## 2010-11-01 NOTE — Op Note (Signed)
  NAMESPIRITUAL, BACINO               ACCOUNT NO.:  [id number]  MEDICAL RECORD NO.:  [id number]          PATIENT TYPE:  LOCATION:                                 FACILITY:  PHYSICIAN:  Linton Rump, M.D.  DATE OF BIRTH:  [birth year]  DATE OF PROCEDURE: DATE OF DISCHARGE:                              OPERATIVE REPORT   PREOPERATIVE DIAGNOSIS:  Cataract, left eye.  POSTOPERATIVE DIAGNOSIS:  Cataract, left eye.  OPERATION PERFORMED:  Cataract extraction with intraocular lens implant with pretreatment with LenSx laser.  ANESTHESIA:  MAC.  SURGEON:  Linton Rump, M.D.  The outpatient setting was the appropriate setting for the procedure.  DESCRIPTION OF PROCEDURE:  The patient is a 65 year old female with painless progressive decrease in vision, so that she has difficulty seeing for her activities of daily living.  The patient was taken to the LenSx Laser Room where an anterior capsulorrhexis was performed using the laser followed by emulsification of the lens.  A primary incision was formed at the temporal cornea with a secondary incision inferiorly. The patient was then taken to the main operating room where she was prepped and draped in the usual manner.  A lid speculum was inserted and the cornea was irrigated with 4% lidocaine drops.  The previously made corneal incisions were then opened using a Sinskey hook.  Viscoelastic was instilled into the anterior chamber and the anterior capsulorrhexis flap was removed without difficulty.  The nucleus was then mobilized by hydrodissection with 1% nonpreserved lidocaine.  The nucleus was then phacoemulsified without difficulty and residual cortical material was removed by irrigation and aspiration.  The posterior capsule was polished and a posterior chamber lens implant, model [id number] of power 22.0 diopters was instilled into the capsular bag without difficulty. The viscoelastic was removed by irrigation and aspiration and  replaced with balanced salt solution.  The wounds were then irrigated with balanced salt solution to hydrate the wound and seal the wounds.  The wounds were checked for fluid leaks and none were noted.  The eye was dressed with topical Pred Forte and Vigamox drops and the patient was taken to the recovery room in excellent condition where she received written and verbal instructions for her postoperative care and was scheduled for followup in 24 hours.          ______________________________ Linton Rump, M.D.    DJD/MEDQ  D:  07/12/2010  T:  07/12/2010  Job:  [id number]  Electronically Signed by Calvert Cantor M.D. on 11/01/2010 09:21:31 AM

## 2010-11-05 ENCOUNTER — Encounter: Payer: Self-pay | Admitting: Cardiovascular Disease

## 2010-11-05 ENCOUNTER — Ambulatory Visit (INDEPENDENT_AMBULATORY_CARE_PROVIDER_SITE_OTHER): Payer: 59 | Admitting: Cardiovascular Disease

## 2010-11-05 ENCOUNTER — Telehealth: Payer: Self-pay | Admitting: Cardiovascular Disease

## 2010-11-05 ENCOUNTER — Other Ambulatory Visit (HOSPITAL_BASED_OUTPATIENT_CLINIC_OR_DEPARTMENT_OTHER): Payer: Self-pay | Admitting: General Surgery

## 2010-11-05 VITALS — BP 187/94 | HR 81 | Resp 18 | Ht 64.0 in | Wt 167.0 lb

## 2010-11-05 DIAGNOSIS — M869 Osteomyelitis, unspecified: Secondary | ICD-10-CM

## 2010-11-05 DIAGNOSIS — I739 Peripheral vascular disease, unspecified: Secondary | ICD-10-CM

## 2010-11-05 NOTE — Assessment & Plan Note (Signed)
LE arterial dopplers are abnormal but stable since last year. Her left foot ulceration is healing well. She continues to follow in the wound clinic. I have offered a distal aortogram and lower ext runoff to better define her disease but she does not wish to pursue this at this time. I will see her back in 6 weeks to discuss angiogram again. She will let me know if she has any changes in her clinical status.

## 2010-11-05 NOTE — Progress Notes (Signed)
History of Present Illness:64 yo female with history of CAD s/p CABG per Dr. Cyndia Bent in February 2011, HTN, DM, LBBB, known PAD who is here today for PV follow up.  She has been seen by Dr. Johnsie Cancel for her cardiac issues. I saw her two weeks ago for evaluation of PAD.  Apparently, she has had previous PAD with reduced ABI of 0.48 on the left and 0.85 on the right in 2011. Dr. Johnsie Cancel recommended a CTA and f/u in [facility] clinic but this was cancelled. She was referred to me by the [facility] for evaluation of lower extremity arterial disease with left foot ulcer. She is being followed in the wound center for this. No fever, chills, or signs of infection.   I arranged lower extremity dopplers which showed ABI of 0.61 on the left and 0.93 on the right. Her wound has been healing well. No fever, chills, rigors. Doing well.    Past Medical History  Diagnosis Date  . Hypertension   . Coronary artery disease   . Neuropathy   . Diabetes mellitus     Past Surgical History  Procedure Date  . Cataract extraction   . Finger surgery   . Coronary artery bypass graft 2011    Current Outpatient Prescriptions  Medication Sig Dispense Refill  . aspirin 325 MG tablet Take 325 mg by mouth daily.        . Cholecalciferol (VITAMIN D) 2000 UNITS tablet Take 2,000 Units by mouth daily.        . CRESTOR 40 MG tablet TAKE 1 TABLET DAILY  90 tablet  0  . fish oil-omega-3 fatty acids 1000 MG capsule Take 2 g by mouth daily.        Marland Kitchen glimepiride (AMARYL) 4 MG tablet Take 4 mg by mouth. As needed       . hydrochlorothiazide (,MICROZIDE/HYDRODIURIL,) 12.5 MG capsule Take 12.5 mg by mouth daily. As needed       . insulin glargine (LANTUS) 100 UNIT/ML injection Inject 10 Units into the skin at bedtime.       . Magnesium 500 MG TABS Take by mouth. daily       . Melatonin 5 MG CAPS Take by mouth.        . metFORMIN (GLUMETZA) 500 MG (MOD) 24 hr tablet Take 500 mg by mouth 2 (two) times daily with a meal.        . metoprolol  tartrate (LOPRESSOR) 25 MG tablet Take 25 mg by mouth 2 (two) times daily.        . valsartan (DIOVAN) 160 MG tablet Take 160 mg by mouth daily.          No Known Allergies  History   Social History  . Marital Status: Married    Spouse Name: N/A    Number of Children: N/A  . Years of Education: N/A   Occupational History  . Not on file.   Social History Main Topics  . Smoking status: Never Smoker   . Smokeless tobacco: Not on file  . Alcohol Use: No  . Drug Use: Not on file  . Sexually Active: Not on file   Other Topics Concern  . Not on file   Social History Narrative  . No narrative on file    No family history on file.  Review of Systems:  As stated in the HPI and otherwise negative.   BP 187/94  Pulse 81  Resp 18  Ht 5\' 4"  (1.626 m)  Wt 167 lb (75.751 kg)  BMI 28.67 kg/m2  Physical Examination: General: Well developed, well nourished, NAD HEENT: OP clear, mucus membranes moist SKIN: warm, dry. No rashes. Neuro: No focal deficits Musculoskeletal: Muscle strength 5/5 all ext Psychiatric: Mood and affect normal Neck: No JVD, no carotid bruits, no thyromegaly, no lymphadenopathy. Lungs:Clear bilaterally, no wheezes, rhonci, crackles Cardiovascular: Regular rate and rhythm. No murmurs, gallops or rubs. Abdomen:Soft. Bowel sounds present. Non-tender.  Extremities: No lower extremity edema. Bandage over left foot.   Arterial doppler: 10/28/10: Left ABI 0.61, right ABI 0.93.

## 2010-11-05 NOTE — Patient Instructions (Signed)
Your physician recommends that you schedule a follow-up appointment in: 6 WEEKS

## 2010-11-05 NOTE — Telephone Encounter (Signed)
I spoke to the pt about her LE disease. She was here today. She told me today that her ulcer was healing. Dr. Lindon Romp called from the wound center and stated that her wound is not healing. I have offered a distal aortogram for next week on 11/13/10 but she is not sure that she wishes to proceed at this time. SHe will call back to let us know.

## 2010-11-07 ENCOUNTER — Telehealth: Payer: Self-pay | Admitting: Cardiovascular Disease

## 2010-11-07 ENCOUNTER — Encounter: Payer: Self-pay | Admitting: Cardiology

## 2010-11-07 DIAGNOSIS — I7389 Other specified peripheral vascular diseases: Secondary | ICD-10-CM

## 2010-11-07 DIAGNOSIS — Z01810 Encounter for preprocedural cardiovascular examination: Secondary | ICD-10-CM

## 2010-11-07 NOTE — Telephone Encounter (Signed)
Pt spoke to Dr. Angelena Form regarding a procedure and she was to call back to get on next Weds 8-8 schedule.  Please call her at [phone]

## 2010-11-07 NOTE — Telephone Encounter (Signed)
Patient scheduled for PV procedure 11/13/10 @ 12:30 pm with Dr. Angelena Form. She will come for blood work 8/3 and pick up her instruction letter.

## 2010-11-08 ENCOUNTER — Other Ambulatory Visit (INDEPENDENT_AMBULATORY_CARE_PROVIDER_SITE_OTHER): Payer: 59 | Admitting: *Deleted

## 2010-11-08 ENCOUNTER — Other Ambulatory Visit (HOSPITAL_BASED_OUTPATIENT_CLINIC_OR_DEPARTMENT_OTHER): Payer: Self-pay | Admitting: General Surgery

## 2010-11-08 ENCOUNTER — Ambulatory Visit (HOSPITAL_COMMUNITY)
Admission: RE | Admit: 2010-11-08 | Discharge: 2010-11-08 | Disposition: A | Discharge status: Home / Self Care | Payer: 59 | Source: Ambulatory Visit | Attending: General Surgery | Admitting: General Surgery

## 2010-11-08 DIAGNOSIS — M19079 Primary osteoarthritis, unspecified ankle and foot: Secondary | ICD-10-CM | POA: Insufficient documentation

## 2010-11-08 DIAGNOSIS — I7389 Other specified peripheral vascular diseases: Secondary | ICD-10-CM

## 2010-11-08 DIAGNOSIS — R209 Unspecified disturbances of skin sensation: Secondary | ICD-10-CM | POA: Insufficient documentation

## 2010-11-08 DIAGNOSIS — M869 Osteomyelitis, unspecified: Secondary | ICD-10-CM

## 2010-11-08 DIAGNOSIS — L97509 Non-pressure chronic ulcer of other part of unspecified foot with unspecified severity: Secondary | ICD-10-CM | POA: Insufficient documentation

## 2010-11-08 DIAGNOSIS — Z01810 Encounter for preprocedural cardiovascular examination: Secondary | ICD-10-CM

## 2010-11-08 DIAGNOSIS — I10 Essential (primary) hypertension: Secondary | ICD-10-CM | POA: Insufficient documentation

## 2010-11-08 DIAGNOSIS — E119 Type 2 diabetes mellitus without complications: Secondary | ICD-10-CM | POA: Insufficient documentation

## 2010-11-08 LAB — CBC WITH DIFFERENTIAL/PLATELET
Basophils Relative: 0.4 % (ref 0.0–3.0)
Eosinophils Relative: 2.5 % (ref 0.0–5.0)
Lymphocytes Relative: 24.6 % (ref 12.0–46.0)
Monocytes Relative: 6.3 % (ref 3.0–12.0)
Neutrophils Relative %: 66.2 % (ref 43.0–77.0)
Platelets: 238 10*3/uL (ref 150.0–400.0)
RBC: 5.07 Mil/uL (ref 3.87–5.11)
WBC: 9.8 10*3/uL (ref 4.5–10.5)

## 2010-11-08 LAB — BASIC METABOLIC PANEL
BUN: 33 mg/dL — ABNORMAL HIGH (ref 6–23)
Calcium: 9.7 mg/dL (ref 8.4–10.5)
Chloride: 100 mEq/L (ref 96–112)
Creatinine, Ser: 1.3 mg/dL — ABNORMAL HIGH (ref 0.4–1.2)
GFR: 44.5 mL/min — ABNORMAL LOW (ref >60.00)

## 2010-11-08 LAB — PROTIME-INR: Prothrombin Time: 10.7 s (ref 10.2–12.4)

## 2010-11-12 ENCOUNTER — Encounter (HOSPITAL_BASED_OUTPATIENT_CLINIC_OR_DEPARTMENT_OTHER): Payer: 59 | Attending: General Surgery

## 2010-11-12 DIAGNOSIS — Z79899 Other long term (current) drug therapy: Secondary | ICD-10-CM | POA: Insufficient documentation

## 2010-11-12 DIAGNOSIS — G589 Mononeuropathy, unspecified: Secondary | ICD-10-CM | POA: Insufficient documentation

## 2010-11-12 DIAGNOSIS — L97509 Non-pressure chronic ulcer of other part of unspecified foot with unspecified severity: Secondary | ICD-10-CM | POA: Insufficient documentation

## 2010-11-12 DIAGNOSIS — I2510 Atherosclerotic heart disease of native coronary artery without angina pectoris: Secondary | ICD-10-CM | POA: Insufficient documentation

## 2010-11-12 DIAGNOSIS — Z951 Presence of aortocoronary bypass graft: Secondary | ICD-10-CM | POA: Insufficient documentation

## 2010-11-12 DIAGNOSIS — I10 Essential (primary) hypertension: Secondary | ICD-10-CM | POA: Insufficient documentation

## 2010-11-12 DIAGNOSIS — Z794 Long term (current) use of insulin: Secondary | ICD-10-CM | POA: Insufficient documentation

## 2010-11-12 DIAGNOSIS — E1169 Type 2 diabetes mellitus with other specified complication: Secondary | ICD-10-CM | POA: Insufficient documentation

## 2010-11-13 ENCOUNTER — Telehealth: Payer: Self-pay | Admitting: Cardiology

## 2010-11-13 ENCOUNTER — Ambulatory Visit (HOSPITAL_COMMUNITY)
Admission: RE | Admit: 2010-11-13 | Discharge: 2010-11-13 | Disposition: A | Discharge status: Home / Self Care | Payer: 59 | Source: Ambulatory Visit | Attending: Cardiovascular Disease | Admitting: Cardiovascular Disease

## 2010-11-13 DIAGNOSIS — I70219 Atherosclerosis of native arteries of extremities with intermittent claudication, unspecified extremity: Secondary | ICD-10-CM

## 2010-11-13 DIAGNOSIS — I7092 Chronic total occlusion of artery of the extremities: Secondary | ICD-10-CM | POA: Insufficient documentation

## 2010-11-13 DIAGNOSIS — L97409 Non-pressure chronic ulcer of unspecified heel and midfoot with unspecified severity: Secondary | ICD-10-CM | POA: Insufficient documentation

## 2010-11-13 DIAGNOSIS — L98499 Non-pressure chronic ulcer of skin of other sites with unspecified severity: Secondary | ICD-10-CM | POA: Insufficient documentation

## 2010-11-13 DIAGNOSIS — I739 Peripheral vascular disease, unspecified: Secondary | ICD-10-CM

## 2010-11-13 LAB — GLUCOSE, CAPILLARY: Glucose-Capillary: 115 mg/dL — ABNORMAL HIGH (ref 70–99)

## 2010-11-13 NOTE — Telephone Encounter (Signed)
Referral put in for VVS ASAP due to total blockage in the left superficial femoral artery.

## 2010-11-14 ENCOUNTER — Encounter: Payer: Self-pay | Admitting: Vascular Surgery

## 2010-11-14 NOTE — Procedures (Signed)
NAMEKIMELA, ADCOX               ACCOUNT NO.:  [id number]  MEDICAL RECORD NO.:  [id number]  LOCATION:  [facility]                         FACILITY:  [facility]  PHYSICIAN:  Lauree Chandler, MDDATE OF BIRTH:  [birth year]  DATE OF PROCEDURE:  11/13/2010 DATE OF DISCHARGE:  11/13/2010                   PERIPHERAL VASCULAR INVASIVE PROCEDURE   PRIMARY CARDIOLOGIST:  Collier Salina C. Johnsie Cancel, MD, [facility]  PROCEDURE PERFORMED:  Distal aortogram with bilateral lower extremity runoff.  OPERATOR:  Lauree Chandler, MD  INDICATIONS:  This is a 65 year old Caucasian female with a history of diabetes mellitus, hypertension, coronary artery disease and peripheral arterial disease who I met in the office several weeks ago for evaluation of her peripheral arterial disease.  The patient had been diagnosed with peripheral arterial disease in 2011.  However, she did not follow up with the recommended testing.  She has over the course of the last year developed an ulceration on her left foot which has been slow to heal.  I saw her in the office and arranged for follow-up diagnostic lower extremity arterial Dopplers which showed a reduced ankle-brachial index of 0.61 on the left, and 0.93 on the right.  The patient's ulcerations have been followed in the [place].  She has had little progress in healing the wound and because of this, we elected to perform a distal aortogram with bilateral lower extremity runoff today.  DETAILS OF PROCEDURE:  The patient was brought to the main cardiac catheterization laboratory after signing informed consent for the procedure.  The right groin was prepped and draped in sterile fashion. Lidocaine 1% was used for local anesthesia.  A 5-French sheath was inserted into the right femoral artery without difficulty.  A pigtail catheter was passed into the distal aorta without difficulty.  We initially performed a distal aortogram visualizing both renal arteries. We then pulled  the pigtail catheter down to the level of the aortic bifurcation and performed angiography of the bilateral iliac arteries. We then performed a runoff through both lower extremities.  The patient tolerated the procedure well, was taken to the recovery area in stable condition.  HEMODYNAMIC FINDINGS:  Central aortic pressure 154/61.  ANGIOGRAPHIC FINDINGS: 1. The distal aorta had mild plaque disease but no aneurysmal segments     and no stenotic lesions. 2. The bilateral renal arteries were patent with mild plaque disease. 3. The right iliac system was patent with mild plaque disease.  The     right superficial femoral artery had serial 60% lesions throughout     the proximal, mid and distal vessel.  The right popliteal artery     had an 80% stenosis.  There was one-vessel runoff to the right foot     via the anterior tibial artery which had diffuse plaque disease.     The peroneal artery and posterior tibial artery were 100% occluded     approximately. 4. The left iliac artery system was patent with no disease.  The left     superficial femoral artery had diffuse plaque throughout the     proximal portion.  The mid vessel had 100% occlusion.  There was     reconstitution of the left popliteal artery via collaterals.  There  was three-vessel runoff to the left foot but diffuse plaque in all     three vessels.  IMPRESSION: 1. Severe peripheral arterial disease with chronic total occlusion of     the left superficial femoral artery in the midportion with     reconstitution in the popliteal artery. 2. Nonhealing ulceration of the left foot. 3. Severe disease in the right lower extremity in the infrainguinal     vessels.  RECOMMENDATIONS:  This patient has a nonhealing ulceration of her left foot and a totally occluded mid left superficial femoral artery.  I have reviewed the films and do not feel that percutaneous intervention will be the best option for revascularization.  There  is a long segment of total occlusion with reconstitution near the knee in the popliteal artery.  I feel that if we become subintimal and reenter, it will be behind the knee and we will likely need to have a stent that crosses the knee.  This would possibly limit future bypass options.  If the patient does need improve flow, I think that a consideration for a surgical revascularization would be a good option.  We will stop today and let the patient go home tonight.  I will place referral to the vein and vascular specialist for a possible femoral popliteal artery bypass surgery.     Lauree Chandler, MD     CM/MEDQ  D:  11/13/2010  T:  11/14/2010  Job:  [id number]  cc:   Wallis Bamberg. Johnsie Cancel, MD, [facility]  Electronically Signed by Lauree Chandler MD on 11/14/2010 02:02:48 PM

## 2010-11-15 ENCOUNTER — Telehealth: Payer: Self-pay | Admitting: Cardiovascular Disease

## 2010-11-15 ENCOUNTER — Ambulatory Visit (INDEPENDENT_AMBULATORY_CARE_PROVIDER_SITE_OTHER): Payer: 59 | Admitting: Vascular Surgery

## 2010-11-15 ENCOUNTER — Encounter: Payer: Self-pay | Admitting: Vascular Surgery

## 2010-11-15 VITALS — BP 108/105 | HR 80 | Temp 97.9°F | Ht 64.5 in | Wt 170.0 lb

## 2010-11-15 DIAGNOSIS — I739 Peripheral vascular disease, unspecified: Secondary | ICD-10-CM

## 2010-11-15 DIAGNOSIS — L97529 Non-pressure chronic ulcer of other part of left foot with unspecified severity: Secondary | ICD-10-CM

## 2010-11-15 DIAGNOSIS — L98499 Non-pressure chronic ulcer of skin of other sites with unspecified severity: Secondary | ICD-10-CM

## 2010-11-15 DIAGNOSIS — L97409 Non-pressure chronic ulcer of unspecified heel and midfoot with unspecified severity: Secondary | ICD-10-CM

## 2010-11-15 NOTE — Telephone Encounter (Signed)
Patient has a rash under her breast. This is only under her breast. She saw Dr. Bridgett Larsson today and he suggested Benadryl. Advised patient that usually if this is a reaction to the contrast dye, she would have more of a systemic reaction. Denies SOB or itching. Will inform us if it doesn't improve.

## 2010-11-15 NOTE — Progress Notes (Signed)
[facility] [place]  Referred by: Dr. Laural Roes  Reason for referral: L SFA occlusion  History of Present Illness  [name] is a 65 y.o. female who presents with chief complaint: B leg pain with ambulation.  Onset of symptom occurred over one year ago.  Pain is described as cramping in calves after walking 1/3-1/2 mile, severity 2-5/10, and associated with ambulation.  Patient has attempted to treat this pain with rest.  The patient has no rest pain symptoms also and a left foot ulcer that began ~2 months ago.  This ulcer is being managed at wound care and per the pt and family, appears to be healing.  The patient denies any prior wounds or gangrene.  Atherosclerotic risk factors include: DM, HTN.  Past Medical History  Diagnosis Date  . Hypertension   . Coronary artery disease   . Neuropathy   . Diabetes mellitus     Past Surgical History  Procedure Date  . Cataract extraction   . Finger surgery   . Coronary artery bypass graft 2011    History   Social History  . Marital Status: Married    Spouse Name: N/A    Number of Children: N/A  . Years of Education: N/A   Occupational History  . Not on file.   Social History Main Topics  . Smoking status: Never Smoker   . Smokeless tobacco: Not on file  . Alcohol Use: No  . Drug Use: Not on file  . Sexually Active: Not on file   Other Topics Concern  . Not on file   Social History Narrative  . No narrative on file    No family history on file.  Current outpatient prescriptions:aspirin 325 MG tablet, Take 325 mg by mouth daily.  , Disp: , Rfl: ;  Cholecalciferol (VITAMIN D3) 2000 UNITS capsule, Take 2,000 Units by mouth daily.  , Disp: , Rfl: ;  CRESTOR 40 MG tablet, TAKE 1 TABLET DAILY, Disp: 90 tablet, Rfl: 0;  fish oil-omega-3 fatty acids 1000 MG capsule, Take 2 g by mouth daily.  , Disp: , Rfl: ;  glimepiride (AMARYL) 4 MG tablet, Take 4 mg by mouth. As needed , Disp: , Rfl:    hydrochlorothiazide (,MICROZIDE/HYDRODIURIL,) 12.5 MG capsule, Take 12.5 mg by mouth daily. As needed , Disp: , Rfl: ;  insulin glargine (LANTUS) 100 UNIT/ML injection, Inject 10 Units into the skin at bedtime. , Disp: , Rfl: ;  Magnesium 500 MG TABS, Take by mouth. daily , Disp: , Rfl: ;  Melatonin 5 MG CAPS, Take by mouth.  , Disp: , Rfl:  metFORMIN (GLUMETZA) 500 MG (MOD) 24 hr tablet, Take 500 mg by mouth 2 (two) times daily with a meal.  , Disp: , Rfl: ;  metoprolol tartrate (LOPRESSOR) 25 MG tablet, Take 25 mg by mouth 2 (two) times daily.  , Disp: , Rfl: ;  valsartan (DIOVAN) 160 MG tablet, Take 160 mg by mouth daily.  , Disp: , Rfl: ;  DIOVAN 320 MG tablet, , Disp: , Rfl: ;  doxycycline (VIBRA-TABS) 100 MG tablet, , Disp: , Rfl:  Lancets (ONETOUCH ULTRASOFT) lancets, , Disp: , Rfl: ;  metFORMIN (GLUCOPHAGE) 1000 MG tablet, , Disp: , Rfl: ;  ONE TOUCH ULTRA TEST test strip, , Disp: , Rfl: ;  SANTYL ointment, , Disp: , Rfl:   Allergies as of 11/15/2010  . (Not on File)    Review of Systems (Positive items in bold and  italic, otherwise negative)  General: Weight loss, Weight gain, Loss of appetite, Fever  Neurologic: Dizziness, Blackouts, Headaches, Seizure  Ear/Nose/Throat: Change in eyesight, Change in hearing, Nose bleeds, Sore throat  Vascular: Pain in legs with walking, Pain in feet while lying flat, Non-healing ulcer, Stroke, "Mini stroke", Slurred speech, Temporary blindness, Blood clot in vein, Phlebitis  Pulmonary: Home oxygen, Productive cough, Bronchitis, Coughing up blood, Asthma, Wheezing  Musculoskeletal: Arthritis, Joint pain, Muscle pain  Cardiac: Chest pain, Chest tightness/pressure, Shortness of breath when lying flat, Shortness of breath with exertion, Palpitations, Heart murmur, Arrythmia, Atrial fibrillation  Hematologic: Bleeding problems, Clotting disorder, Anemia  Psychiatric:  Depression, Anxiety, Attention deficit disorder  Gastrointestinal:  Black stool,  Blood in stool, Peptic ulcer disease, Reflux, Hiatal hernia, Trouble swallowing, Diarrhea, Constipation  Urinary:  Kidney disease, Burning with urination, Frequent urination, Difficulty urinating  Skin: Foot Ulcers, Rashes  Physical Examination  Filed Vitals:   11/15/10 1415  BP: 108/105  Pulse: 80  Temp: 97.9 F (36.6 C)   General: A&O x 3, WDWN  Head: [place]/AT  Ear/Nose/Throat: Hearing grossly intact, nares w/o erythema or drainage, oropharynx w/o Erythema/Exudate  Eyes: PERRLA, EOMI  Neck: Supple, no nuchal rigidity, no palpable LAD  Pulmonary: Sym exp, good air movt, CTAB, no rales, rhonchi, & wheezing  Cardiac: RRR, Nl S1, S2, no Murmurs, rubs or gallops  Vascular: Vessel Right Left  Radial Palpable Palpable  [place] Palpable Palpable  Brachial Palpable Palpable  Carotid Palpable, without bruit Palpable, without bruit  Aorta Non-palpable N/A  Femoral Palpable Palpable  Popliteal Non-palpable Non-palpable  PT Palpable Non-Palpable  DP Palpable Non-Palpable   Gastrointestinal: soft, NTND, -G/R, - HSM, - masses, - CVAT B  Musculoskeletal: M/S 5/5 throughout, Extremities without ischemic changes except  Left 5th MT ulcer laterally: good granulation tissue at the base  Neurologic: CN 2-12 intact , Pain and light touch intact in extremities , Motor exam as listed above  Psychiatric: Judgment intact, Mood & affect appropriatefor pt's clinical situation  Dermatologic: See M/S exam for extremity exam, rash on chest/abdomen inferior to breasts  Lymph : No Cervical, Axillary, or Inguinal lymphadenopathy   Outside Studies/Documentation 15 pages of outside documents were reviewed including: ABI.  Medical Decision Making  [name] is a 65 y.o. female who presents with: BLE intermittent claudication and healing L 5th MT ulcer   I discussed with the patient the natural history of intermittent claudication: 75% of patients have stable or improved symptoms in a year  an only 2% require amputation. Eventually 20% may require intervention in a year.  I discussed in depth with the patient the nature of atherosclerosis, and emphasized the importance of maximal medical management including strict control of blood pressure, blood glucose, and lipid levels, obtaining regular exercise, and cessation of smoking.  The patient is aware that without maximal medical management the underlying atherosclerotic disease process will progress, limiting the benefit of any interventions.  I discussed in depth with the patient a walking plan and how to execute such.  If the patient was not healing, I would recommend proceeding with L CFA to BK popliteal bypass, but she appears to be healing with good granulation at the base of the ulcer.  As the TBI on L is 0.61, which is relatively resistant to effects of calcification, this suggests this pt should be able to heal this L 5th MT ulcer.  Given the risk-benefit analysis in this patient's case, we decided to continue with conservative (non-operative) measures and follow  up in 4 weeks.  If the ulcer is not adequately healed by one month or worsens, I will recommend proceeding with R CFA to BK pop bypass.  To facilitate this I will obtain L GSV mapping.  The R GSV has already been harvested by endovenous methods.  She will follow up with [name] in one month.  Thank you for allowing [name] to participate in this patient's care.  Adele Barthel, MD [facility] [place] Office: [phone] Pager: [phone]

## 2010-11-15 NOTE — Telephone Encounter (Signed)
Pt has a angio gram Thursday and she discovered a rash under her breast and she was wondering if it could be related to the testing maybe the dye or something. She will be leaving at noon for another MD appt

## 2010-11-15 NOTE — Telephone Encounter (Signed)
lmtcb

## 2010-11-22 ENCOUNTER — Ambulatory Visit: Payer: 59 | Admitting: Cardiovascular Disease

## 2010-12-06 ENCOUNTER — Encounter: Payer: Self-pay | Admitting: Vascular Surgery

## 2010-12-10 ENCOUNTER — Encounter: Payer: Self-pay | Admitting: Cardiovascular Disease

## 2010-12-10 ENCOUNTER — Ambulatory Visit (INDEPENDENT_AMBULATORY_CARE_PROVIDER_SITE_OTHER): Payer: Medicare Other | Admitting: Cardiovascular Disease

## 2010-12-10 DIAGNOSIS — I739 Peripheral vascular disease, unspecified: Secondary | ICD-10-CM

## 2010-12-10 DIAGNOSIS — I10 Essential (primary) hypertension: Secondary | ICD-10-CM

## 2010-12-10 DIAGNOSIS — I2510 Atherosclerotic heart disease of native coronary artery without angina pectoris: Secondary | ICD-10-CM

## 2010-12-10 DIAGNOSIS — E7800 Pure hypercholesterolemia, unspecified: Secondary | ICD-10-CM

## 2010-12-10 NOTE — Assessment & Plan Note (Signed)
Well controlled.  Continue current medications and low sodium Dash type diet.    

## 2010-12-10 NOTE — Patient Instructions (Signed)
Your physician wants you to follow-up in: [place] will receive a reminder letter in the mail two months in advance. If you don't receive a letter, please call our office to schedule the follow-up appointment.   12-13-10 2PM- LOWER EXT DOPPLERS  12-13-10 2:45PM APPT WITH DR Bridgett Larsson

## 2010-12-10 NOTE — Progress Notes (Signed)
[name] is seen as a new patient to me. I believe I saw her a few years ago. for LBBB. She was hospitalized at the end of February 2011  for [place]. Cath by Dr Marigene Ehlers showed severe 2VD involving the LAD and a codominant circ. She had been noncompliant with her meds especially DM and HTN. I reviewed her hospital records and cath film She subsequently had CABG by Dr Cyndia Bent. Her post op course was bening. She has F/U with Dr Cyndia Bent and wounds have healed well with no pleural effusion on F/U CXR.  She stopped her crestor about a month ago but did not have any changes in some constitutional symptoms and I encouraged her to start taking it again. She also stopeed her amlodipine. Since she appears to have increasing LE edema we will not reinstitute it and add a diuretic back instead.  She has PVD with ABI's today that were reviewed. Pre-CABG abnormal .6 on left. Today she was .48 on left and .85 on right. Has had wound in L foot and w/u by Dr Dinah Beers included recent angiogram.  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    She has a VVS F/U with Ila Mcgill this Friday for venous mapping .  ? Not sure as I believe a gortex graft would be in order not vein.  She has F/U with Dr Dinah Beers 9/20.  Cardiac status is stable and she does not need myovue to clear for surgery.    ROS: Denies fever, malais, weight loss, blurry vision, decreased visual acuity, cough, sputum, SOB, hemoptysis, pleuritic pain, palpitaitons, heartburn, abdominal pain, melena, lower extremity edema, claudication, or rash.  All other systems reviewed and negative  General: Affect appropriate Healthy:  appears stated age 65: normal Neck supple with no adenopathy JVP normal no bruits no thyromegaly Lungs clear with no wheezing and good diaphragmatic motion Heart:  S1/S2 no murmur,rub, gallop or click PMI normal Abdomen: benighn, BS positve, no tenderness, no AAA no bruit.  No HSM or HJR Poor pulses below knee left greater  than right.  Left foot in boot not unwrapped No edema Neuro non-focal Skin warm and dry No muscular weakness   Current Outpatient Prescriptions  Medication Sig Dispense Refill  . aspirin 325 MG tablet Take 325 mg by mouth daily.        . Cholecalciferol (VITAMIN D3) 2000 UNITS capsule Take 2,000 Units by mouth daily.        . CRESTOR 40 MG tablet TAKE 1 TABLET DAILY  90 tablet  0  . DIOVAN 320 MG tablet Take 160 mg by mouth daily.       . fish oil-omega-3 fatty acids 1000 MG capsule Take 2 g by mouth daily.        Marland Kitchen glimepiride (AMARYL) 4 MG tablet Take 4 mg by mouth. As needed       . hydrochlorothiazide (,MICROZIDE/HYDRODIURIL,) 12.5 MG capsule Take 12.5 mg by mouth daily. As needed       . insulin glargine (LANTUS) 100 UNIT/ML injection Inject into the skin at bedtime. Sliding scale      . Lancets (ONETOUCH ULTRASOFT) lancets       . Magnesium 500 MG TABS Take by mouth. daily       . Melatonin 5 MG CAPS Take by mouth.        . metFORMIN (GLUMETZA) 500 MG (MOD) 24 hr tablet Take 500 mg by mouth 2 (two) times daily with a meal.        .  metoprolol tartrate (LOPRESSOR) 25 MG tablet Take 25 mg by mouth 2 (two) times daily.        . ONE TOUCH ULTRA TEST test strip         Allergies  Atenolol and Omnipaque  Electrocardiogram:  Assessment and Plan

## 2010-12-10 NOTE — Assessment & Plan Note (Signed)
Cholesterol is at goal.  Continue current dose of statin and diet Rx.  No myalgias or side effects.  F/U  LFT's in 6 months. Lab Results  Component Value Date   LDLCALC  Value: 115        Total Cholesterol/HDL:CHD Risk Coronary Heart Disease Risk Table                     Men   Women  1/2 Average Risk   3.4   3.3  Average Risk       5.0   4.4  2 X Average Risk   9.6   7.1  3 X Average Risk  23.4   11.0        Use the calculated Patient Ratio above and the CHD Risk Table to determine the patient's CHD Risk.        ATP III CLASSIFICATION (LDL):  <100     mg/dL   Optimal  100-129  mg/dL   Near or Above                    Optimal  130-159  mg/dL   Borderline  160-189  mg/dL   High  >190     mg/dL   Very High* 05/25/2009

## 2010-12-10 NOTE — Assessment & Plan Note (Signed)
F/U Dr Lindon Romp wound center.  Consider hyperbaric Rx.  Sees CM 9/20.  F/U with Bridgett Larsson Friday for venous mapping.

## 2010-12-10 NOTE — Assessment & Plan Note (Signed)
Stable with no angina and good activity level.  Continue medical Rx  CABG 2011 no need for myovue to clear for vascular surgery

## 2010-12-13 ENCOUNTER — Encounter: Payer: Self-pay | Admitting: Vascular Surgery

## 2010-12-13 ENCOUNTER — Ambulatory Visit (INDEPENDENT_AMBULATORY_CARE_PROVIDER_SITE_OTHER): Payer: Medicare Other | Admitting: *Deleted

## 2010-12-13 ENCOUNTER — Ambulatory Visit (INDEPENDENT_AMBULATORY_CARE_PROVIDER_SITE_OTHER): Payer: Medicare Other | Admitting: Vascular Surgery

## 2010-12-13 VITALS — BP 163/87 | HR 82 | Temp 98.2°F

## 2010-12-13 DIAGNOSIS — I739 Peripheral vascular disease, unspecified: Secondary | ICD-10-CM

## 2010-12-13 DIAGNOSIS — E1159 Type 2 diabetes mellitus with other circulatory complications: Secondary | ICD-10-CM

## 2010-12-13 DIAGNOSIS — L98499 Non-pressure chronic ulcer of skin of other sites with unspecified severity: Secondary | ICD-10-CM

## 2010-12-13 DIAGNOSIS — Z01810 Encounter for preprocedural cardiovascular examination: Secondary | ICD-10-CM

## 2010-12-13 NOTE — Progress Notes (Signed)
[facility] [place]  Established Critical Limb Ischemia Patient  History of Present Illness  [name] is a 65 y.o. female who presents with chief complaint: left 5th MT ulcer.   The patient has no  rest pain and wounds include: 5th MT ulcer which is healing under care of the wound care clinic.  The patient notes symptoms have improved.  The patient's treatment regimen currently included: maximal medical management and wound care management of 5th MT.  Past Medical History, Past Surgical History, Social History, Family History, Medications, Allergies, and Review of Systems are unchanged from previous evaluation on 11/15/10.  Physical Examination  Filed Vitals:   12/13/10 1438  BP: 163/87  Pulse: 82  Temp: 98.2 F (36.8 C)    General: A&O x 3, WDWN  Vascular:  Vessel  Right  Left   Radial  Palpable  Palpable   Brachial  Palpable  Palpable   Carotid  Palpable, without bruit  Palpable, without bruit   Aorta  Non-palpable  N/A   Femoral  Palpable  Palpable   Popliteal  Non-palpable  Non-palpable   PT  Palpable  Non-Palpable   DP  Palpable  Non-Palpable    Musculoskeletal: M/S 5/5 throughout , Extremities without ischemic changes , L 5th MT ulcer appears smaller than previous with good granulation at the base  Neurologic: Pain and light touch intact in extremities , Motor exam as listed above  Non-Invasive Vascular Imaging LLE Vein Mapping (Date: 12/13/10)  LLE: GSV 3.3 - 4.4 mm from groin to slightly below the knee  Medical Decision Making  [name] is a 65 y.o. female who presents with: L SFA occlusion of healing 5th MT ulcer.   As the patient appears to be healing, we will hold off on L fem-pop for now, per patient's wishes  If she fails to heal or the wound deteriorates, she will follow up ASAP for a L fem-pop bypass w/ GSV  I discussed in depth with the patient the nature of atherosclerosis, and emphasized the importance of maximal  medical management including strict control of blood pressure, blood glucose, and lipid levels, obtaining regular exercise, and cessation of smoking.  The patient is aware that without maximal medical management the underlying atherosclerotic disease process will progress, limiting the benefit of any interventions.  She will follow up with use for BLE ABI in 3 months to continue surveillance on her PAD  Thank you for allowing [name] to participate in this patient's care.  Adele Barthel, MD [facility] [place] Office: [phone] Pager: [phone]

## 2010-12-17 ENCOUNTER — Encounter (HOSPITAL_BASED_OUTPATIENT_CLINIC_OR_DEPARTMENT_OTHER): Payer: Medicare Other | Attending: General Surgery

## 2010-12-17 DIAGNOSIS — I10 Essential (primary) hypertension: Secondary | ICD-10-CM | POA: Insufficient documentation

## 2010-12-17 DIAGNOSIS — L97509 Non-pressure chronic ulcer of other part of unspecified foot with unspecified severity: Secondary | ICD-10-CM | POA: Insufficient documentation

## 2010-12-17 DIAGNOSIS — Z794 Long term (current) use of insulin: Secondary | ICD-10-CM | POA: Insufficient documentation

## 2010-12-17 DIAGNOSIS — Z79899 Other long term (current) drug therapy: Secondary | ICD-10-CM | POA: Insufficient documentation

## 2010-12-17 DIAGNOSIS — Z951 Presence of aortocoronary bypass graft: Secondary | ICD-10-CM | POA: Insufficient documentation

## 2010-12-17 DIAGNOSIS — E1169 Type 2 diabetes mellitus with other specified complication: Secondary | ICD-10-CM | POA: Insufficient documentation

## 2010-12-17 DIAGNOSIS — G589 Mononeuropathy, unspecified: Secondary | ICD-10-CM | POA: Insufficient documentation

## 2010-12-17 DIAGNOSIS — I2510 Atherosclerotic heart disease of native coronary artery without angina pectoris: Secondary | ICD-10-CM | POA: Insufficient documentation

## 2010-12-18 NOTE — Procedures (Unsigned)
VASCULAR LAB EXAM  INDICATION:  Preop evaluation for bypass graft placement.  HISTORY: Diabetes: Cardiac: Hypertension.  EXAM:  Left greater saphenous vein mapping.  IMPRESSION: 1. The left greater saphenous vein is compressible with diameter     measurements ranging from 0.28 to 0.44 cm. 2. The left lesser saphenous vein was not adequately visualized.  ___________________________________________ Conrad Osage, MD  CH/MEDQ  D:  12/13/2010  T:  12/13/2010  Job:  [id number]

## 2010-12-23 ENCOUNTER — Telehealth: Payer: Self-pay | Admitting: Cardiovascular Disease

## 2010-12-23 NOTE — Telephone Encounter (Signed)
Pt has an appt and wants to know if she needs to keep it?  She just saw Dr. Johnsie Cancel and Dr. Bridgett Larsson.  She has weekly appt with wound care.  Oct 2 she sees PCP.  This appt was made before her referral to Dr. Bridgett Larsson.  Does he still need to see her?  If no answer, please leave message letting her know whether to keep or not.

## 2010-12-23 NOTE — Telephone Encounter (Signed)
Spoke with pt and told her I would review follow up plans with Dr. Angelena Form and return her call tomorrow.  Will mail copy of labs done prior to procedure to her per her request.

## 2010-12-24 NOTE — Telephone Encounter (Signed)
She has plans to follow with dr. Bridgett Larsson of [facility] and surveillance dopplers per Dr. Bridgett Larsson in 3 months. NO need to follow with me for her PV issues. She will follow with Dr. Johnsie Cancel as planned for cardiac issues. cdm

## 2010-12-24 NOTE — Telephone Encounter (Signed)
Pt notified. She will plan on following up with Dr. Bridgett Larsson and Dr. Johnsie Cancel. Upcoming appt with Dr. Angelena Form cancelled.

## 2010-12-26 ENCOUNTER — Ambulatory Visit: Payer: 59 | Admitting: Cardiovascular Disease

## 2011-01-07 ENCOUNTER — Encounter (HOSPITAL_BASED_OUTPATIENT_CLINIC_OR_DEPARTMENT_OTHER): Payer: Medicare Other | Attending: General Surgery

## 2011-01-07 DIAGNOSIS — L97509 Non-pressure chronic ulcer of other part of unspecified foot with unspecified severity: Secondary | ICD-10-CM | POA: Insufficient documentation

## 2011-01-07 DIAGNOSIS — I2510 Atherosclerotic heart disease of native coronary artery without angina pectoris: Secondary | ICD-10-CM | POA: Insufficient documentation

## 2011-01-07 DIAGNOSIS — I10 Essential (primary) hypertension: Secondary | ICD-10-CM | POA: Insufficient documentation

## 2011-01-07 DIAGNOSIS — Z951 Presence of aortocoronary bypass graft: Secondary | ICD-10-CM | POA: Insufficient documentation

## 2011-01-07 DIAGNOSIS — Z79899 Other long term (current) drug therapy: Secondary | ICD-10-CM | POA: Insufficient documentation

## 2011-01-07 DIAGNOSIS — Z794 Long term (current) use of insulin: Secondary | ICD-10-CM | POA: Insufficient documentation

## 2011-01-07 DIAGNOSIS — G589 Mononeuropathy, unspecified: Secondary | ICD-10-CM | POA: Insufficient documentation

## 2011-01-07 DIAGNOSIS — E1169 Type 2 diabetes mellitus with other specified complication: Secondary | ICD-10-CM | POA: Insufficient documentation

## 2011-01-16 ENCOUNTER — Telehealth: Payer: Self-pay | Admitting: Cardiovascular Disease

## 2011-01-16 MED ORDER — ROSUVASTATIN CALCIUM 20 MG PO TABS: 20.0000 mg | ORAL_TABLET | Freq: Every day | ORAL | Status: DC

## 2011-01-16 NOTE — Telephone Encounter (Signed)
Pt called. She wants to know if you have talked to primary [facility] about crestor strength

## 2011-01-16 NOTE — Telephone Encounter (Signed)
Spoke with pt, she recently had a physical and her total cholesterol was 124. they told the pt we may need to decrease the dosage of the crestor. Her LDL was 34. Pt instructed to cut the crestor in 1/2 to 20 mg once daily. [name]

## 2011-01-28 NOTE — H&P (Signed)
  [name], FEHL               ACCOUNT NO.:  [id number]  MEDICAL RECORD NO.:  [id number]  LOCATION:  FOOT                         FACILITY:  [facility]  PHYSICIAN:  Elesa Hacker, M.D.        DATE OF BIRTH:  1945-09-16  DATE OF ADMISSION:  10/15/2010 DATE OF DISCHARGE:                             HISTORY & PHYSICAL   CHIEF COMPLAINT:  Wound on left foot.  HISTORY OF PRESENT ILLNESS:  This is a 65 year old female, diabetic for several years, who developed a blister from walking while on vacation. This is deepened and she now comes for care.  She is status post CABG. She did have vascular workup done a year ago, which revealed an ABI of 0.6 on the left side and was thought to be sign of peripheral vascular disease.  She has just recently been placed on insulin.  The wound is not painful and tender.  There has been no foul odor.  She has no history of intermittent claudication or previous ulceration.  PAST MEDICAL HISTORY:  She has known neuropathy, hypertension, and coronary artery disease.  SURGICAL HISTORY:  She had cataract surgery.  Finger surgery for gout and a CABG in 2011.  Cigarettes none.  Alcohol none.  MEDICATIONS:  Insulin, glimepiride, metformin, Diovan, metoprolol, aspirin, Crestor, hydrochlorothiazide, magnesium, melatonin, vitamin D and fish oil.  ALLERGIES:  None.  REVIEW OF SYSTEMS:  As above.  PHYSICAL EXAMINATION:  GENERAL:  Well-developed, well-nourished, in no distress. VITAL SIGNS:  Temperature 98.9, pulse 79, respirations 21, blood pressure 180/98. EYES, EARS, NOSE, AND THROAT:  Normal. NECK:  Supple. CHEST:  Clear. HEART:  Regular rhythm.  Well-healed midline thoracic wound. ABDOMEN:  Not examined. EXTREMITIES:  Decreased pulse on the left side.  There is a 0.8 x 0.8 plantar surface ulcer over the fifth metatarsal head.  It can be probed to tendon fairly easily.  There is forefoot neuropathy bilaterally.  IMPRESSION:  Diabetic foot ulcer.  X-ray  negative for osteo, also with neuropathy, and very probable peripheral vascular disease.  PLAN:  Vascular workup, Santyl, hydrogel, probably needs Vascular consult and offloading.     Elesa Hacker, M.D.     RA/MEDQ  D:  10/15/2010  T:  10/16/2010  Job:  [id number]  Electronically Signed by Elesa Hacker M.D. on 01/28/2011 09:09:47 AM

## 2011-02-03 ENCOUNTER — Telehealth: Payer: Self-pay | Admitting: Cardiology

## 2011-02-03 MED ORDER — VALSARTAN 320 MG PO TABS: 160.0000 mg | ORAL_TABLET | Freq: Every day | ORAL | Status: DC

## 2011-02-03 MED ORDER — METOPROLOL TARTRATE 25 MG PO TABS: 25.0000 mg | ORAL_TABLET | Freq: Two times a day (BID) | ORAL | Status: DC

## 2011-02-11 ENCOUNTER — Other Ambulatory Visit: Payer: Self-pay | Admitting: Internal Medicine

## 2011-02-11 DIAGNOSIS — R799 Abnormal finding of blood chemistry, unspecified: Secondary | ICD-10-CM

## 2011-02-18 ENCOUNTER — Other Ambulatory Visit: Payer: Self-pay | Admitting: Cardiovascular Disease

## 2011-03-03 NOTE — Telephone Encounter (Signed)
Follow-up:    Would like to permanently switch her pharmacy over to [facility] [phone: [phone] fax: [phone]] and would like to have her metoprolol tartrate (LOPRESSOR) 25 MG tablet, valsartan (DIOVAN) 320 MG tablet, and rosuvastatin (CRESTOR) 20 MG tablet refilled. Please call when prescription has been filled.

## 2011-03-04 MED ORDER — VALSARTAN 320 MG PO TABS: 160.0000 mg | ORAL_TABLET | Freq: Every day | ORAL | Status: DC

## 2011-03-04 MED ORDER — METOPROLOL TARTRATE 25 MG PO TABS: 25.0000 mg | ORAL_TABLET | Freq: Two times a day (BID) | ORAL | Status: DC

## 2011-03-04 MED ORDER — ROSUVASTATIN CALCIUM 20 MG PO TABS: 20.0000 mg | ORAL_TABLET | Freq: Every day | ORAL | Status: DC

## 2011-03-05 ENCOUNTER — Other Ambulatory Visit: Payer: Medicare Other

## 2011-03-13 ENCOUNTER — Encounter: Payer: Self-pay | Admitting: Vascular Surgery

## 2011-03-14 ENCOUNTER — Other Ambulatory Visit (INDEPENDENT_AMBULATORY_CARE_PROVIDER_SITE_OTHER): Payer: Medicare Other | Admitting: *Deleted

## 2011-03-14 ENCOUNTER — Encounter: Payer: Self-pay | Admitting: Vascular Surgery

## 2011-03-14 ENCOUNTER — Ambulatory Visit (INDEPENDENT_AMBULATORY_CARE_PROVIDER_SITE_OTHER): Payer: Medicare Other | Admitting: Vascular Surgery

## 2011-03-14 VITALS — BP 182/92 | HR 83 | Resp 18 | Ht 64.5 in | Wt 175.0 lb

## 2011-03-14 DIAGNOSIS — L98499 Non-pressure chronic ulcer of skin of other sites with unspecified severity: Secondary | ICD-10-CM

## 2011-03-14 DIAGNOSIS — I70219 Atherosclerosis of native arteries of extremities with intermittent claudication, unspecified extremity: Secondary | ICD-10-CM

## 2011-03-14 NOTE — Progress Notes (Signed)
[facility] [place]  Established Intermittent Claudication  History of Present Illness  [name] is a 65 y.o. female who presents with chief complaint: bilateral calf pain.  The patient's symptoms have not progressed.  The patient's symptoms are: intermittent claudication at 1/3 mile walking.  The patient's treatment regimen currently included: maximal medical management including walking plan, which pt is slowly increasing distance.  Past Medical History, Past Surgical History, Social History, Family History, Medications, Allergies, and Review of Systems are unchanged from previous evaluation on 11/15/10.  Physical Examination  Filed Vitals:   03/14/11 1158  BP: 182/92  Pulse: 83  Resp: 18  Height: 5' 4.5" (1.638 m)  Weight: 175 lb (79.379 kg)    General: A&O x 3, WDWN  Pulmonary: Sym exp, good air movt, CTAB, no rales, rhonchi, & wheezing  Cardiac: RRR, Nl S1, S2, no Murmurs, rubs or gallops  Vascular:  Vessel  Right  Left   Radial  Palpable  Palpable   [place]  Palpable  Palpable   Brachial  Palpable  Palpable   Carotid  Palpable, without bruit  Palpable, without bruit   Aorta  Non-palpable  N/A   Femoral  Palpable  Palpable   Popliteal  Non-palpable  Non-palpable   PT  Palpable  Non-Palpable   DP  Palpable  Non-Palpable    Musculoskeletal: M/S 5/5 throughout , Extremities without ischemic changes , L 5th MT ulcer healed, callus over 1st MT  Neurologic: Pain and light touch intact in extremities , Motor exam as listed above  Non-Invasive Vascular Imaging ABI (Date: 03/14/11)  RLE: 1.04, [id number], DP: triphasic  LLE: 0.61, PT and DP: monophasic  Medical Decision Making  [name] is a 65 y.o. female who presents with: BLE non-lifestyle limiting intermittent claudication without evidence of critical limb ischemia.  Based on the patient's vascular studies and examination, I have offered the patient: continued surveillance q 6  months.  The patient will continue to ramp up her walking plan until she reaches the goal of 1 hour 3 times a week.  I discussed in depth with the patient the nature of atherosclerosis, and emphasized the importance of maximal medical management including strict control of blood pressure, blood glucose, and lipid levels, obtaining regular exercise, and cessation of smoking.  The patient is aware that without maximal medical management the underlying atherosclerotic disease process will progress, limiting the benefit of any interventions.  Thank you for allowing [name] to participate in this patient's care.  Adele Barthel, MD [facility] [place] Office: [phone] Pager: [phone]

## 2011-03-27 ENCOUNTER — Other Ambulatory Visit: Payer: Self-pay | Admitting: Cardiovascular Disease

## 2011-03-27 MED ORDER — HYDROCHLOROTHIAZIDE 12.5 MG PO CAPS: 12.5000 mg | ORAL_CAPSULE | Freq: Every day | ORAL | Status: DC

## 2011-04-30 HISTORY — PX: DENTAL SURGERY: SHX609

## 2011-05-13 ENCOUNTER — Encounter: Payer: Self-pay | Admitting: Cardiovascular Disease

## 2011-05-13 DIAGNOSIS — N183 Chronic kidney disease, stage 3 unspecified: Secondary | ICD-10-CM | POA: Diagnosis not present

## 2011-05-13 DIAGNOSIS — E119 Type 2 diabetes mellitus without complications: Secondary | ICD-10-CM | POA: Diagnosis not present

## 2011-05-13 DIAGNOSIS — I10 Essential (primary) hypertension: Secondary | ICD-10-CM | POA: Diagnosis not present

## 2011-05-13 DIAGNOSIS — I129 Hypertensive chronic kidney disease with stage 1 through stage 4 chronic kidney disease, or unspecified chronic kidney disease: Secondary | ICD-10-CM | POA: Diagnosis not present

## 2011-05-20 ENCOUNTER — Telehealth: Payer: Self-pay | Admitting: Cardiovascular Disease

## 2011-05-20 NOTE — Telephone Encounter (Signed)
New Msg: Pt calling wanting to speak with nurse regarding Dr. Fleet Contras with [facility] wanting pt to double pt diovan 160mg  to once in the am and once in the pm. Please return pt call to discuss further.

## 2011-05-21 NOTE — Telephone Encounter (Signed)
F/U  Patient returning nurse call to f/u on Divan msg from 2/12, please return call

## 2011-05-23 NOTE — Telephone Encounter (Signed)
Fu call Pt calling back about this issue

## 2011-05-23 NOTE — Telephone Encounter (Signed)
Patient just wanted to make sure Dr Johnsie Cancel aware of medication change and ok with it.  Advised patient would forward to Dr Johnsie Cancel and Altha Harm RN for review.  Will only call patient back if he does NOT agree with change, she has already started new dose.

## 2011-05-26 NOTE — Telephone Encounter (Signed)
Med change ok

## 2011-05-26 NOTE — Telephone Encounter (Signed)
PT AWARE OKAY TO INCREASE  DIOVAN./CY

## 2011-06-02 ENCOUNTER — Other Ambulatory Visit: Payer: Self-pay | Admitting: *Deleted

## 2011-06-02 MED ORDER — HYDROCHLOROTHIAZIDE 12.5 MG PO CAPS: 12.5000 mg | ORAL_CAPSULE | Freq: Every day | ORAL | Status: DC

## 2011-06-02 MED ORDER — VALSARTAN 320 MG PO TABS: 160.0000 mg | ORAL_TABLET | Freq: Every day | ORAL | Status: DC

## 2011-08-21 DIAGNOSIS — H26499 Other secondary cataract, unspecified eye: Secondary | ICD-10-CM | POA: Diagnosis not present

## 2011-09-11 DIAGNOSIS — H26499 Other secondary cataract, unspecified eye: Secondary | ICD-10-CM | POA: Diagnosis not present

## 2011-09-12 ENCOUNTER — Other Ambulatory Visit: Payer: Medicare Other

## 2011-09-12 ENCOUNTER — Ambulatory Visit: Payer: Medicare Other | Admitting: Neurosurgery

## 2011-09-25 DIAGNOSIS — H26499 Other secondary cataract, unspecified eye: Secondary | ICD-10-CM | POA: Diagnosis not present

## 2011-10-04 ENCOUNTER — Encounter (HOSPITAL_COMMUNITY): Payer: Self-pay | Admitting: *Deleted

## 2011-10-04 ENCOUNTER — Emergency Department (HOSPITAL_COMMUNITY): Payer: Medicare Other

## 2011-10-04 ENCOUNTER — Inpatient Hospital Stay (HOSPITAL_COMMUNITY)
Admission: EM | Admit: 2011-10-04 | Discharge: 2011-10-06 | DRG: 638 | Disposition: A | Discharge status: Home / Self Care | Payer: Medicare Other | Attending: Internal Medicine | Admitting: Internal Medicine

## 2011-10-04 DIAGNOSIS — Z8249 Family history of ischemic heart disease and other diseases of the circulatory system: Secondary | ICD-10-CM

## 2011-10-04 DIAGNOSIS — Z951 Presence of aortocoronary bypass graft: Secondary | ICD-10-CM | POA: Diagnosis not present

## 2011-10-04 DIAGNOSIS — L089 Local infection of the skin and subcutaneous tissue, unspecified: Secondary | ICD-10-CM

## 2011-10-04 DIAGNOSIS — I2510 Atherosclerotic heart disease of native coronary artery without angina pectoris: Secondary | ICD-10-CM

## 2011-10-04 DIAGNOSIS — L03116 Cellulitis of left lower limb: Secondary | ICD-10-CM

## 2011-10-04 DIAGNOSIS — L03119 Cellulitis of unspecified part of limb: Secondary | ICD-10-CM | POA: Diagnosis not present

## 2011-10-04 DIAGNOSIS — I509 Heart failure, unspecified: Secondary | ICD-10-CM

## 2011-10-04 DIAGNOSIS — E1165 Type 2 diabetes mellitus with hyperglycemia: Secondary | ICD-10-CM

## 2011-10-04 DIAGNOSIS — I10 Essential (primary) hypertension: Secondary | ICD-10-CM | POA: Diagnosis not present

## 2011-10-04 DIAGNOSIS — E1149 Type 2 diabetes mellitus with other diabetic neurological complication: Secondary | ICD-10-CM | POA: Diagnosis not present

## 2011-10-04 DIAGNOSIS — N183 Chronic kidney disease, stage 3 unspecified: Secondary | ICD-10-CM | POA: Diagnosis present

## 2011-10-04 DIAGNOSIS — Z79899 Other long term (current) drug therapy: Secondary | ICD-10-CM | POA: Diagnosis not present

## 2011-10-04 DIAGNOSIS — E118 Type 2 diabetes mellitus with unspecified complications: Secondary | ICD-10-CM

## 2011-10-04 DIAGNOSIS — I252 Old myocardial infarction: Secondary | ICD-10-CM

## 2011-10-04 DIAGNOSIS — L97509 Non-pressure chronic ulcer of other part of unspecified foot with unspecified severity: Secondary | ICD-10-CM | POA: Diagnosis not present

## 2011-10-04 DIAGNOSIS — E1169 Type 2 diabetes mellitus with other specified complication: Secondary | ICD-10-CM | POA: Diagnosis not present

## 2011-10-04 DIAGNOSIS — IMO0002 Reserved for concepts with insufficient information to code with codable children: Secondary | ICD-10-CM

## 2011-10-04 DIAGNOSIS — Z794 Long term (current) use of insulin: Secondary | ICD-10-CM | POA: Diagnosis not present

## 2011-10-04 DIAGNOSIS — J438 Other emphysema: Secondary | ICD-10-CM

## 2011-10-04 DIAGNOSIS — N179 Acute kidney failure, unspecified: Secondary | ICD-10-CM | POA: Diagnosis not present

## 2011-10-04 DIAGNOSIS — L02619 Cutaneous abscess of unspecified foot: Secondary | ICD-10-CM | POA: Diagnosis not present

## 2011-10-04 DIAGNOSIS — L03818 Cellulitis of other sites: Secondary | ICD-10-CM

## 2011-10-04 DIAGNOSIS — M79609 Pain in unspecified limb: Secondary | ICD-10-CM | POA: Diagnosis not present

## 2011-10-04 DIAGNOSIS — T148XXA Other injury of unspecified body region, initial encounter: Secondary | ICD-10-CM | POA: Diagnosis not present

## 2011-10-04 DIAGNOSIS — I129 Hypertensive chronic kidney disease with stage 1 through stage 4 chronic kidney disease, or unspecified chronic kidney disease: Secondary | ICD-10-CM | POA: Diagnosis present

## 2011-10-04 DIAGNOSIS — E785 Hyperlipidemia, unspecified: Secondary | ICD-10-CM

## 2011-10-04 DIAGNOSIS — N259 Disorder resulting from impaired renal tubular function, unspecified: Secondary | ICD-10-CM

## 2011-10-04 DIAGNOSIS — R0602 Shortness of breath: Secondary | ICD-10-CM

## 2011-10-04 DIAGNOSIS — L02818 Cutaneous abscess of other sites: Secondary | ICD-10-CM

## 2011-10-04 DIAGNOSIS — Z7982 Long term (current) use of aspirin: Secondary | ICD-10-CM | POA: Diagnosis not present

## 2011-10-04 DIAGNOSIS — I739 Peripheral vascular disease, unspecified: Secondary | ICD-10-CM

## 2011-10-04 DIAGNOSIS — I70219 Atherosclerosis of native arteries of extremities with intermittent claudication, unspecified extremity: Secondary | ICD-10-CM

## 2011-10-04 DIAGNOSIS — E119 Type 2 diabetes mellitus without complications: Secondary | ICD-10-CM

## 2011-10-04 DIAGNOSIS — L97529 Non-pressure chronic ulcer of other part of left foot with unspecified severity: Secondary | ICD-10-CM

## 2011-10-04 DIAGNOSIS — J45909 Unspecified asthma, uncomplicated: Secondary | ICD-10-CM

## 2011-10-04 DIAGNOSIS — L02419 Cutaneous abscess of limb, unspecified: Secondary | ICD-10-CM | POA: Diagnosis not present

## 2011-10-04 DIAGNOSIS — M109 Gout, unspecified: Secondary | ICD-10-CM

## 2011-10-04 DIAGNOSIS — L97909 Non-pressure chronic ulcer of unspecified part of unspecified lower leg with unspecified severity: Secondary | ICD-10-CM | POA: Diagnosis not present

## 2011-10-04 DIAGNOSIS — E7800 Pure hypercholesterolemia, unspecified: Secondary | ICD-10-CM

## 2011-10-04 DIAGNOSIS — IMO0001 Reserved for inherently not codable concepts without codable children: Secondary | ICD-10-CM | POA: Diagnosis present

## 2011-10-04 LAB — HEMOGLOBIN A1C: Hgb A1c MFr Bld: 10.8 % — ABNORMAL HIGH (ref <5.7)

## 2011-10-04 LAB — BASIC METABOLIC PANEL
BUN: 32 mg/dL — ABNORMAL HIGH (ref 6–23)
Calcium: 9.8 mg/dL (ref 8.4–10.5)
GFR calc non Af Amer: 38 mL/min — ABNORMAL LOW (ref >90)
Glucose, Bld: 268 mg/dL — ABNORMAL HIGH (ref 70–99)
Potassium: 4.5 mEq/L (ref 3.5–5.1)

## 2011-10-04 LAB — CREATININE, SERUM
Creatinine, Ser: 1.56 mg/dL — ABNORMAL HIGH (ref 0.50–1.10)
GFR calc Af Amer: 39 mL/min — ABNORMAL LOW (ref >90)
GFR calc non Af Amer: 34 mL/min — ABNORMAL LOW (ref >90)

## 2011-10-04 LAB — CBC WITH DIFFERENTIAL/PLATELET
Eosinophils Absolute: 0.1 10*3/uL (ref 0.0–0.7)
Eosinophils Relative: 0 % (ref 0–5)
Hemoglobin: 14.6 g/dL (ref 12.0–15.0)
Lymphs Abs: 1.9 10*3/uL (ref 0.7–4.0)
MCH: 29.2 pg (ref 26.0–34.0)
MCV: 85.6 fL (ref 78.0–100.0)
Monocytes Relative: 7 % (ref 3–12)
RBC: 5 MIL/uL (ref 3.87–5.11)

## 2011-10-04 LAB — CBC
Platelets: 213 10*3/uL (ref 150–400)
RDW: 13.2 % (ref 11.5–15.5)
WBC: 14.6 10*3/uL — ABNORMAL HIGH (ref 4.0–10.5)

## 2011-10-04 LAB — URIC ACID: Uric Acid, Serum: 9.7 mg/dL — ABNORMAL HIGH (ref 2.4–7.0)

## 2011-10-04 MED ORDER — SODIUM CHLORIDE 0.9 % IJ SOLN
3.0000 mL | Freq: Two times a day (BID) | INTRAMUSCULAR | Status: DC
  Administered 2011-10-04 – 2011-10-05 (×2): 3 mL via INTRAVENOUS

## 2011-10-04 MED ORDER — INSULIN ASPART 100 UNIT/ML ~~LOC~~ SOLN
0.0000 [IU] | Freq: Three times a day (TID) | SUBCUTANEOUS | Status: DC
  Administered 2011-10-04: 7 [IU] via SUBCUTANEOUS
  Administered 2011-10-05: 2 [IU] via SUBCUTANEOUS
  Administered 2011-10-05: 5 [IU] via SUBCUTANEOUS
  Administered 2011-10-05 – 2011-10-06 (×2): 1 [IU] via SUBCUTANEOUS
  Administered 2011-10-06: 9 [IU] via SUBCUTANEOUS

## 2011-10-04 MED ORDER — MELATONIN 5 MG PO CAPS: 5.0000 mg | ORAL_CAPSULE | Freq: Every day | ORAL | Status: DC

## 2011-10-04 MED ORDER — PIPERACILLIN-TAZOBACTAM 3.375 G IVPB
3.3750 g | Freq: Three times a day (TID) | INTRAVENOUS | Status: DC
  Administered 2011-10-04 – 2011-10-06 (×5): 3.375 g via INTRAVENOUS
  Filled 2011-10-04 (×8): qty 50

## 2011-10-04 MED ORDER — HYDROCODONE-ACETAMINOPHEN 5-325 MG PO TABS
1.0000 | ORAL_TABLET | ORAL | Status: DC | PRN
  Administered 2011-10-04: 1 via ORAL
  Filled 2011-10-04: qty 1

## 2011-10-04 MED ORDER — ASPIRIN 325 MG PO TABS
325.0000 mg | ORAL_TABLET | Freq: Every day | ORAL | Status: DC
  Administered 2011-10-04: 325 mg via ORAL
  Filled 2011-10-04 (×3): qty 1

## 2011-10-04 MED ORDER — ACETAMINOPHEN 325 MG PO TABS: 650.0000 mg | ORAL_TABLET | Freq: Four times a day (QID) | ORAL | Status: DC | PRN

## 2011-10-04 MED ORDER — ENOXAPARIN SODIUM 40 MG/0.4ML ~~LOC~~ SOLN
40.0000 mg | SUBCUTANEOUS | Status: DC
  Administered 2011-10-04 – 2011-10-05 (×2): 40 mg via SUBCUTANEOUS
  Filled 2011-10-04 (×4): qty 0.4

## 2011-10-04 MED ORDER — SODIUM CHLORIDE 0.9 % IV SOLN
250.0000 mL | INTRAVENOUS | Status: DC | PRN
  Administered 2011-10-04: 250 mL via INTRAVENOUS

## 2011-10-04 MED ORDER — OMEGA-3-ACID ETHYL ESTERS 1 G PO CAPS
1.0000 g | ORAL_CAPSULE | Freq: Two times a day (BID) | ORAL | Status: DC
  Administered 2011-10-04 – 2011-10-06 (×4): 1 g via ORAL
  Filled 2011-10-04 (×6): qty 1

## 2011-10-04 MED ORDER — ATORVASTATIN CALCIUM 40 MG PO TABS
40.0000 mg | ORAL_TABLET | Freq: Every day | ORAL | Status: DC
  Administered 2011-10-04 – 2011-10-05 (×2): 40 mg via ORAL
  Filled 2011-10-04 (×3): qty 1

## 2011-10-04 MED ORDER — VANCOMYCIN HCL 1000 MG IV SOLR
1250.0000 mg | INTRAVENOUS | Status: DC
  Administered 2011-10-05: 1250 mg via INTRAVENOUS
  Filled 2011-10-04 (×2): qty 1250

## 2011-10-04 MED ORDER — VITAMIN D3 25 MCG (1000 UNIT) PO TABS
2000.0000 [IU] | ORAL_TABLET | Freq: Every day | ORAL | Status: DC
  Administered 2011-10-05 – 2011-10-06 (×2): 2000 [IU] via ORAL
  Filled 2011-10-04 (×2): qty 2

## 2011-10-04 MED ORDER — DOCUSATE SODIUM 100 MG PO CAPS
100.0000 mg | ORAL_CAPSULE | Freq: Two times a day (BID) | ORAL | Status: DC
  Administered 2011-10-04 – 2011-10-06 (×4): 100 mg via ORAL
  Filled 2011-10-04 (×6): qty 1

## 2011-10-04 MED ORDER — INSULIN GLARGINE 100 UNIT/ML ~~LOC~~ SOLN
15.0000 [IU] | Freq: Every morning | SUBCUTANEOUS | Status: DC
  Administered 2011-10-05 – 2011-10-06 (×2): 15 [IU] via SUBCUTANEOUS

## 2011-10-04 MED ORDER — ACETAMINOPHEN 650 MG RE SUPP: 650.0000 mg | Freq: Four times a day (QID) | RECTAL | Status: DC | PRN

## 2011-10-04 MED ORDER — PIPERACILLIN-TAZOBACTAM 3.375 G IVPB
3.3750 g | Freq: Once | INTRAVENOUS | Status: AC
  Administered 2011-10-04: 3.375 g via INTRAVENOUS
  Filled 2011-10-04: qty 50

## 2011-10-04 MED ORDER — SODIUM CHLORIDE 0.9 % IV BOLUS (SEPSIS)
500.0000 mL | Freq: Once | INTRAVENOUS | Status: AC
  Administered 2011-10-04: 500 mL via INTRAVENOUS

## 2011-10-04 MED ORDER — GLIMEPIRIDE 4 MG PO TABS
4.0000 mg | ORAL_TABLET | Freq: Two times a day (BID) | ORAL | Status: DC
  Administered 2011-10-04 – 2011-10-06 (×4): 4 mg via ORAL
  Filled 2011-10-04 (×6): qty 1

## 2011-10-04 MED ORDER — SODIUM CHLORIDE 0.9 % IV SOLN: INTRAVENOUS | Status: DC

## 2011-10-04 MED ORDER — VANCOMYCIN HCL IN DEXTROSE 1-5 GM/200ML-% IV SOLN
1000.0000 mg | Freq: Once | INTRAVENOUS | Status: AC
  Administered 2011-10-04: 1000 mg via INTRAVENOUS
  Filled 2011-10-04: qty 200

## 2011-10-04 MED ORDER — METOPROLOL TARTRATE 25 MG PO TABS
25.0000 mg | ORAL_TABLET | Freq: Two times a day (BID) | ORAL | Status: DC
  Administered 2011-10-04 – 2011-10-06 (×4): 25 mg via ORAL
  Filled 2011-10-04 (×6): qty 1

## 2011-10-04 MED ORDER — MORPHINE SULFATE 2 MG/ML IJ SOLN: 1.0000 mg | INTRAMUSCULAR | Status: DC | PRN

## 2011-10-04 MED ORDER — ONDANSETRON HCL 4 MG PO TABS: 4.0000 mg | ORAL_TABLET | Freq: Four times a day (QID) | ORAL | Status: DC | PRN

## 2011-10-04 MED ORDER — COLLAGENASE 250 UNIT/GM EX OINT
TOPICAL_OINTMENT | Freq: Every day | CUTANEOUS | Status: DC
  Administered 2011-10-04 – 2011-10-05 (×2): via TOPICAL
  Administered 2011-10-06: 1 via TOPICAL
  Filled 2011-10-04 (×2): qty 30

## 2011-10-04 MED ORDER — OMEGA-3 FATTY ACIDS 1000 MG PO CAPS: 1.0000 g | ORAL_CAPSULE | Freq: Two times a day (BID) | ORAL | Status: DC

## 2011-10-04 MED ORDER — ONDANSETRON HCL 4 MG/2ML IJ SOLN: 4.0000 mg | Freq: Three times a day (TID) | INTRAMUSCULAR | Status: DC | PRN

## 2011-10-04 MED ORDER — ONDANSETRON HCL 4 MG/2ML IJ SOLN: 4.0000 mg | Freq: Four times a day (QID) | INTRAMUSCULAR | Status: DC | PRN

## 2011-10-04 MED ORDER — SODIUM CHLORIDE 0.9 % IJ SOLN: 3.0000 mL | INTRAMUSCULAR | Status: DC | PRN

## 2011-10-04 NOTE — ED Notes (Addendum)
Pt reports having a callous to (L) foot for several months, reports she usually wraps area and is ambulatory w/minimal pain, pt reports wrapping (L) foot on Wednesday, pt reports increase activity d/t family visiting over the last few days, pt noticed a "blood blister" to (L) foot Friday when she removed her gauze and a "red streak" to area starting last night.

## 2011-10-04 NOTE — ED Provider Notes (Signed)
Medical screening examination/treatment/procedure(s) were conducted as a shared visit with non-physician practitioner(s) and myself.  I personally evaluated the patient during the encounter  L foot with chronic callus, increasing pain and swelling at 1st MTP, redness streaking up foot.  No fever. Hx PVD, but foot warm and well perfused, weakly palpable pulses.  Ezequiel Essex, MD 10/04/11 [phone]

## 2011-10-04 NOTE — Progress Notes (Signed)
PHARMACIST - PHYSICIAN ORDER COMMUNICATION  CONCERNING: P&T Medication Policy on Herbal Medications  DESCRIPTION:  This patient's order for:  Melatonin  has been noted.  This product(s) is classified as an "herbal" or natural product. Due to a lack of definitive safety studies or FDA approval, nonstandard manufacturing practices, plus the potential risk of unknown drug-drug interactions while on inpatient medications, the Pharmacy and Therapeutics Committee does not permit the use of "herbal" or natural products of this type within [facility].   ACTION TAKEN: The pharmacy department is unable to verify this order at this time and your patient has been informed of this safety policy. Please reevaluate patient's clinical condition at discharge and address if the herbal or natural product(s) should be resumed at that time.    Alycia Rossetti, PharmD, BCPS Clinical Pharmacist Pager: [phone] 10/04/2011 6:17 PM

## 2011-10-04 NOTE — H&P (Signed)
[facility] History and Physical  [name] [id number] DOB: 03-30-1946 DOA: 10/04/2011   PCP: Rachel Moulds, DO   Chief Complaint: Left foot pain and redness.   HPI: Very pleasant 66 year old woman with PMH significant for PVD, Diabetes, HTN, CAD s/p CABAG who presents to ED complaining of left foot redness, swelling and pain. She has Callous left foot for several months. She notice redness, worsening pain site callous day prior to admission. She also notice black discoloration site of callous. She notices redness left foot. She denies fevers, chills. She has prior history of left medial foot infection which resolved.  She has history of PVD. She was suppose to follow up with Dr Bridgett Larsson for ABI earlier June. She denies worsening symptoms of her claudication. Her last Hb A1c was in February and it was at 10.     Review of Systems:  Constitutional:  No weight loss, night sweats, Fevers, chills, fatigue.  HEENT:  No headaches, Difficulty swallowing,Tooth/dental problems,Sore throat,  No sneezing, itching, ear ache, nasal congestion, post nasal drip,  Cardio-vascular:  No chest pain, Orthopnea, PND, swelling in lower extremities, anasarca, dizziness, palpitations  GI:  No heartburn, indigestion, abdominal pain, nausea, vomiting, diarrhea, change in bowel habits, loss of appetite  Resp:  No shortness of breath with exertion or at rest. No excess mucus, no productive cough, No non-productive cough, No coughing up of blood.No change in color of mucus.No wheezing.No chest wall deformity  Skin:  no rash or lesions.  GU:  no dysuria, change in color of urine, no urgency or frequency. No flank pain.   Psych:  No change in mood or affect. No depression or anxiety. No memory loss.    Past Medical History  Diagnosis Date  . Hypertension   . Coronary artery disease   . Neuropathy   . Diabetes mellitus   . Hyperlipidemia   . Myocardial infarction 05/2009  . Arthritis   . Ulcer      Past Surgical History  Procedure Date  . Cataract extraction   . Finger surgery   . Coronary artery bypass graft 2011   Social History:  reports that she has never smoked. She does not have any smokeless tobacco history on file. She reports that she does not drink alcohol or use illicit drugs.  Allergies  Allergen Reactions  . Atenolol     Exacerbates gout  . Omnipaque (Iohexol) Rash    Family History  Problem Relation Age of Onset  . Hypertension Brother   Father Died of aspiration PNA.  Mother has diabetes, HTN.   Prior to Admission medications   Medication Sig Start Date End Date Taking? Authorizing Provider  aspirin 325 MG tablet Take 325 mg by mouth at bedtime.    Yes Historical Provider, MD  cholecalciferol (VITAMIN D) 1000 UNITS tablet Take 2,000 Units by mouth daily.   Yes Historical Provider, MD  fish oil-omega-3 fatty acids 1000 MG capsule Take 1 g by mouth 2 (two) times daily.    Yes Historical Provider, MD  glimepiride (AMARYL) 4 MG tablet Take 4 mg by mouth 2 (two) times daily.    Yes Historical Provider, MD  hydrochlorothiazide (MICROZIDE) 12.5 MG capsule Take 12.5 mg by mouth every other day.   Yes Historical Provider, MD  insulin glargine (LANTUS) 100 UNIT/ML injection Inject 10-20 Units into the skin every morning. <140 = 10 units 140-200 = 15 units  >200 = 20 units   Yes Historical Provider, MD  Magnesium 500  MG TABS Take 500 mg by mouth 2 (two) times daily as needed. For constipation.   Yes Historical Provider, MD  Melatonin 5 MG CAPS Take 5 mg by mouth at bedtime.    Yes Historical Provider, MD  metFORMIN (GLUCOPHAGE) 500 MG tablet Take 1,000 mg by mouth 2 (two) times daily with a meal.   Yes Historical Provider, MD  metoprolol tartrate (LOPRESSOR) 25 MG tablet Take 25 mg by mouth 2 (two) times daily.   Yes Historical Provider, MD  rosuvastatin (CRESTOR) 20 MG tablet Take 20 mg by mouth daily.   Yes Historical Provider, MD  valsartan (DIOVAN) 160 MG tablet  Take 160 mg by mouth 2 (two) times daily.   Yes Historical Provider, MD   Physical Exam: Filed Vitals:   10/04/11 1207  BP: 171/76  Pulse: 83  Temp: 98.7 F (37.1 C)  TempSrc: Oral  Resp: 18  SpO2: 99%   BP 171/76  Pulse 83  Temp 98.7 F (37.1 C) (Oral)  Resp 18  SpO2 99%  General Appearance:    Alert, cooperative, no distress, appears stated age  Head:    Normocephalic, without obvious abnormality, atraumatic  Eyes:    PERRL, conjunctiva/corneas clear, EOM's intact,     Ears:    Normal TM's and external ear canals, both ears  Nose:   Nares normal, septum midline, mucosa normal, no drainage    or sinus tenderness  Throat:   Lips, mucosa, and tongue normal; teeth and gums normal  Neck:   Supple, symmetrical, trachea midline, no adenopathy;    thyroid:  no enlargement/tenderness/nodules; no carotid   bruit or JVD  Back:     Symmetric, no curvature, ROM normal, no CVA tenderness  Lungs:     Clear to auscultation bilaterally, respirations unlabored      Heart:    Regular rate and rhythm, S1 and S2 normal, no murmur, rub   or gallop     Abdomen:     Soft, non-tender, bowel sounds active all four quadrants,    no masses, no organomegaly        Extremities:   Extremities normal, atraumatic, no cyanosis or edema, Callus lateral aspect of first metatarsal, callus with dark, black color, foul smelling. Redness plantar aspect and lateral aspect of foot.   Pulses:   2+ and symmetric all extremities  Skin:   Skin color, texture, turgor normal, no rashes or lesions  Lymph nodes:   Cervical, supraclavicular, and axillary nodes normal  Neurologic:   CNII-XII intact, normal strength, sensation and reflexes    throughout    Labs on Admission:  Basic Metabolic Panel:  Lab [id number] 1313  NA 137  K 4.5  CL 97  CO2 26  GLUCOSE 268*  BUN 32*  CREATININE 1.43*  CALCIUM 9.8  MG --  PHOS --   CBC:  Lab 10/04/11 1313  WBC 17.1*  NEUTROABS 13.9*  HGB 14.6  HCT 42.8  MCV 85.6    PLT 227   Radiological Exams on Admission: Dg Foot Complete Left  10/04/2011  *RADIOLOGY REPORT*  Clinical Data: Wound infection  LEFT FOOT - COMPLETE 3+ VIEW  Comparison: 10/08/2010  Findings: Small calcaneal spur at the plantar aponeurosis.  There is subcutaneous gas at the plantar aspect of the first MTP joint. No definite focal cortical loss to suggest osteomyelitis.  Mild hallux valgus deformity.  No fracture or other acute bony abnormality.  IMPRESSION:  1.  Subcutaneous gas at the plantar aspect of the  first MTP joint without radiographic evidence of osteomyelitis.  Original Report Authenticated By: Trecia Rogers, M.D.     Assessment/Plan:   1-Left foot infection: Patient with pain redness, x ray show subcutaneous gas at the plantar aspect of the first MTP joint  without radiographic evidence of osteomyelitis. Her WBC is elevated at 17. I will continue with Vancomycin and Zosyn. I will order ABIs. Orthopedic consulted.    2-DM: I will hold metformin while in the hospital, and because of mildly increase Cr at 1.4.  Continue with Lantus 15 units, glipizide. I will order HB-A1c, SSI.    3-HYPERTENSION: Hold ACE due to mildly increase cr. Continue with metoprolol.    4-PAD (peripheral artery disease): I will order ABIs.   5-CKD stage 3: Last Creatinine records was at 1.3. Will monitor renal function.      Time spend: 35 minutes Family Communication: Patient and husband who was at bedside were inform  plan of car.    Niel Hummer, MD  [facility] Pager [phone]  If 7PM-7AM, please contact night-coverage [email or web address] Password [facility] 10/04/2011, 3:41 PM

## 2011-10-04 NOTE — ED Notes (Signed)
Reports having blister to left foot for several days, kept bandaged. Today reports black area to left foot and red streaks going up left lower leg.

## 2011-10-04 NOTE — ED Notes (Signed)
Patient denies pain and is resting comfortably. Pt denies constant pain, reports pain only when her leg is "hanging"

## 2011-10-04 NOTE — Consult Note (Signed)
Reason for Consult: Abscess cellulitis left foot great toe MTP joint Referring Physician: Tanaisa Jarrard is an 66 y.o. female.  HPI: Patient is a 67 year old woman with type 2 diabetes he noticed a blood blister with red streaking coming up her foot today. Patient states that she did have a callus on this part of her foot and that she was wrapping it with gauze to try to unload pressure from the callus.  Past Medical History  Diagnosis Date  . Hypertension   . Coronary artery disease   . Neuropathy   . Diabetes mellitus   . Hyperlipidemia   . Myocardial infarction 05/2009  . Arthritis   . Ulcer     Past Surgical History  Procedure Date  . Cataract extraction   . Finger surgery   . Coronary artery bypass graft 2011    Family History  Problem Relation Age of Onset  . Hypertension Brother     Social History:  reports that she has never smoked. She does not have any smokeless tobacco history on file. She reports that she does not drink alcohol or use illicit drugs.  Allergies:  Allergies  Allergen Reactions  . Atenolol     Exacerbates gout  . Omnipaque (Iohexol) Rash    Medications: I have reviewed the patient's current medications.  Results for orders placed during the hospital encounter of 10/04/11 (from the past 48 hour(s))  CBC WITH DIFFERENTIAL     Status: Abnormal   Collection Time   10/04/11  1:13 PM      Component Value Range Comment   WBC 17.1 (*) 4.0 - 10.5 K/uL    RBC 5.00  3.87 - 5.11 MIL/uL    Hemoglobin 14.6  12.0 - 15.0 g/dL    HCT 42.8  36.0 - 46.0 %    MCV 85.6  78.0 - 100.0 fL    MCH 29.2  26.0 - 34.0 pg    MCHC 34.1  30.0 - 36.0 g/dL    RDW 13.3  11.5 - 15.5 %    Platelets 227  150 - 400 K/uL    Neutrophils Relative 81 (*) 43 - 77 %    Neutro Abs 13.9 (*) 1.7 - 7.7 K/uL    Lymphocytes Relative 11 (*) 12 - 46 %    Lymphs Abs 1.9  0.7 - 4.0 K/uL    Monocytes Relative 7  3 - 12 %    Monocytes Absolute 1.3 (*) 0.1 - 1.0 K/uL    Eosinophils Relative 0  0 - 5 %    Eosinophils Absolute 0.1  0.0 - 0.7 K/uL    Basophils Relative 0  0 - 1 %    Basophils Absolute 0.0  0.0 - 0.1 K/uL   BASIC METABOLIC PANEL     Status: Abnormal   Collection Time   10/04/11  1:13 PM      Component Value Range Comment   Sodium 137  135 - 145 mEq/L    Potassium 4.5  3.5 - 5.1 mEq/L    Chloride 97  96 - 112 mEq/L    CO2 26  19 - 32 mEq/L    Glucose, Bld 268 (*) 70 - 99 mg/dL    BUN 32 (*) 6 - 23 mg/dL    Creatinine, Ser 1.43 (*) 0.50 - 1.10 mg/dL    Calcium 9.8  8.4 - 10.5 mg/dL    GFR calc non Af Amer 38 (*) >90 mL/min    GFR calc Af  Amer 43 (*) >90 mL/min     Dg Foot Complete Left  10/04/2011  *RADIOLOGY REPORT*  Clinical Data: Wound infection  LEFT FOOT - COMPLETE 3+ VIEW  Comparison: 10/08/2010  Findings: Small calcaneal spur at the plantar aponeurosis.  There is subcutaneous gas at the plantar aspect of the first MTP joint. No definite focal cortical loss to suggest osteomyelitis.  Mild hallux valgus deformity.  No fracture or other acute bony abnormality.  IMPRESSION:  1.  Subcutaneous gas at the plantar aspect of the first MTP joint without radiographic evidence of osteomyelitis.  Original Report Authenticated By: Trecia Rogers, M.D.    Review of Systems  All other systems reviewed and are negative.   Blood pressure 166/79, pulse 85, temperature 98.5 F (36.9 C), temperature source Oral, resp. rate 18, SpO2 99.00%. Physical Exam On examination patient has a warm foot but there is not good palpable pulses. She states that she is being followed by vascular vein specialist for her peripheral vascular disease. She states that revascularization is not being actively considered at this time. Patient does have a large ulcerative blister on the great toe MTP joint there is streaking going up her foot to the ankle. After informed consent and sterile prepping with Betadine. A suture scissors and pickups were used to debris the skin  and soft tissue and decompressed the abscess of the left foot great toe MTP joint. Wound cultures were obtained and sent to microbiology. The wound was cleaned and dressed with a sterile dressing. Review of the radiographs does show periarticular lytic changes with which does appear to be consistent with chronic gout. Assessment/Plan: Assessment: Abscess with cellulitis left foot great toe MTP joint. #2 chronic gout.  Plan: The skin and soft tissue abscess was decompressed this did not communicate with the joint. Will start with Santyl dressing changes daily adjust antibiotics as per the results from the wound cultures. Patient is to use her Darco shoe for ambulation. Will review uric acid level for possible treatment for chronic gout. I do not feel that surgical intervention is necessary at this time. I will followup with the patient as an outpatient and followed during her hospital stay.  Nathaniel Wakeley V 10/04/2011, 4:40 PM

## 2011-10-04 NOTE — Progress Notes (Signed)
ANTIBIOTIC CONSULT NOTE - INITIAL  Pharmacy Consult for Vancomycin and Zosyn Indication: Left foot infection  Allergies  Allergen Reactions  . Atenolol     Exacerbates gout  . Omnipaque (Iohexol) Rash    Patient Measurements:   Weight ~ 79 kg Height ~ 64 inches   Vital Signs: Temp: 98.5 F (36.9 C) (06/29 1550) Temp src: Oral (06/29 1550) BP: 166/79 mmHg (06/29 1550) Pulse Rate: 85  (06/29 1550) Intake/Output from previous day:   Intake/Output from this shift: Total I/O In: 750 [I.V.:750] Out: 425 [Urine:425]  Labs:  [facility] 10/04/11 1313  WBC 17.1*  HGB 14.6  PLT 227  LABCREA --  CREATININE 1.43*   CrCl ~ 45 ml/min  Microbiology: No results found for this or any previous visit (from the past 720 hour(s)).  Medical History: Past Medical History  Diagnosis Date  . Hypertension   . Coronary artery disease   . Neuropathy   . Diabetes mellitus   . Hyperlipidemia   . Myocardial infarction 05/2009  . Arthritis   . Ulcer     Medications:  Home meds: ASA, Vit D, Fish Oil, Amaryl, HCTZ, Lantus, Magnesium, Melatonin, Metformin, Metoprolol, Crestor, Valsartan  Pt received Vancomycin 1gm IV x 1 ~ 1430 in ED   Assessment: 66 yo F admitted with pain, swelling,and redness to LLE.  Pt has had a callous for quite some time however it has worsened over last 24 hours.  Foot xray without evidence of osteomyelitis.  WBC is elevated.  Pt denies fevers, chills, or other signs of infection.  Goal of Therapy:  Vancomycin trough level 10-15 mcg/ml  Plan:  Zosyn 3.375 gm IV q8h (4 hour infusion). Vancomycin 1250 mg IV q24h. (next dose due ~ 1400 10/05/11) Will follow-up culture data, renal function, and clinical progress. Will check Vancomycin trough as indicated.  [employer], Pharm.D., BCPS Clinical Pharmacist Pager [phone] 10/04/2011 4:18 PM

## 2011-10-04 NOTE — Progress Notes (Signed)
10/04/11 1433  OTHER  CSW Follow Up Status No follow-up needed     No LCSW interventions identified. Pt based on DX may require [facility] for which CM will need to be consulted. Please contact unit based LCSW if psychosocial needs are identified.   Rosario Adie MSW [facility]. Weekend/Social Worker [phone]

## 2011-10-04 NOTE — Progress Notes (Signed)
10/04/11 1432  Discharge Planning  Type of Residence Private residence  [place] other [place] # [id number])  [place] No  [place] other  Do you have any problems obtaining your medications? No  Family/patient expects to be discharged to: Private residence  Once you are discharged, how will you get to your follow-up appointment? Self;Family  Expected Discharge Date 10/07/11  Case Management Consult Needed Yes (Comment)  Social Work Consult Needed No

## 2011-10-04 NOTE — ED Provider Notes (Signed)
History     CSN: [id number]  Arrival date & time 10/04/11  1200   First MD Initiated Contact with Patient 10/04/11 1226      Chief Complaint  Patient presents with  . Wound Infection    (Consider location/radiation/quality/duration/timing/severity/associated sxs/prior treatment) HPI  Patient presents to ER with a few day hx of left medial foot infection. Patient states she is an insulin dependent diabetic and had to be seen at the wound center last year for numerous visits for a left lateral foot infection. Patient states a few days ago she noticed a soreness on the left medial aspect of her foot but states she had house guests and therefore did not take the time to evaluate her foot closely rather, she placed extra padding around foot and began to wear her postop shoe from her prior wound. Patient states that yesterday she noticed a large black blister to the area of soreness and in some streaking redness up her leg. She denies fevers, chills, drainage from site. Patient states the soreness improved with the padding in her postop shoe. Patient states soreness with direct touch and pressure to wound.  Past Medical History  Diagnosis Date  . Hypertension   . Coronary artery disease   . Neuropathy   . Diabetes mellitus   . Hyperlipidemia   . Myocardial infarction 05/2009  . Arthritis   . Ulcer     Past Surgical History  Procedure Date  . Cataract extraction   . Finger surgery   . Coronary artery bypass graft 2011    Family History  Problem Relation Age of Onset  . Hypertension Brother     History  Substance Use Topics  . Smoking status: Never Smoker   . Smokeless tobacco: Not on file  . Alcohol Use: No    OB History    Grav Para Term Preterm Abortions TAB SAB Ect Mult Living                  Review of Systems  All other systems reviewed and are negative.    Allergies  Atenolol and Omnipaque  Home Medications   Current Outpatient Rx  Name Route Sig  Dispense Refill  . ASPIRIN 325 MG PO TABS Oral Take 325 mg by mouth at bedtime.     Marland Kitchen VITAMIN D 1000 UNITS PO TABS Oral Take 2,000 Units by mouth daily.    . OMEGA-3 FATTY ACIDS 1000 MG PO CAPS Oral Take 1 g by mouth 2 (two) times daily.     Marland Kitchen GLIMEPIRIDE 4 MG PO TABS Oral Take 4 mg by mouth 2 (two) times daily.     Marland Kitchen HYDROCHLOROTHIAZIDE 12.5 MG PO CAPS Oral Take 12.5 mg by mouth every other day.    . INSULIN GLARGINE 100 UNIT/ML [place] SOLN Subcutaneous Inject 10-20 Units into the skin every morning. <140 = 10 units 140-200 = 15 units  >200 = 20 units    . MAGNESIUM 500 MG PO TABS Oral Take 500 mg by mouth 2 (two) times daily as needed. For constipation.    Marland Kitchen MELATONIN 5 MG PO CAPS Oral Take 5 mg by mouth at bedtime.     Marland Kitchen METFORMIN HCL 500 MG PO TABS Oral Take 1,000 mg by mouth 2 (two) times daily with a meal.    . METOPROLOL TARTRATE 25 MG PO TABS Oral Take 25 mg by mouth 2 (two) times daily.    Marland Kitchen ROSUVASTATIN CALCIUM 20 MG PO TABS Oral Take 20  mg by mouth daily.    Marland Kitchen VALSARTAN 160 MG PO TABS Oral Take 160 mg by mouth 2 (two) times daily.      BP 171/76  Pulse 83  Temp 98.7 F (37.1 C) (Oral)  Resp 18  SpO2 99%  Physical Exam  Nursing note and vitals reviewed. Constitutional: She is oriented to person, place, and time. She appears well-developed and well-nourished. No distress.  HENT:  Head: Normocephalic and atraumatic.  Eyes: Conjunctivae are normal.  Cardiovascular: Normal rate, regular rhythm, normal heart sounds and intact distal pulses.  Exam reveals no gallop and no friction rub.   No murmur heard. Pulmonary/Chest: Effort normal and breath sounds normal. No respiratory distress. She has no wheezes. She has no rales. She exhibits no tenderness.  Abdominal: Soft. Bowel sounds are normal. She exhibits no distension and no mass. There is no tenderness. There is no rebound and no guarding.  Musculoskeletal: Normal range of motion. She exhibits tenderness. She exhibits no edema.    Neurological: She is alert and oriented to person, place, and time.  Skin: Skin is warm and dry. No rash noted. She is not diaphoretic. There is erythema.       Large blood filled fluctuant bulla to left medial foot at great toe MTP with streaking redness into foot foot and leg but no soft tissue swelling. Mild TTP. No drainage.   Psychiatric: She has a normal mood and affect.    ED Course  Procedures (including critical care time)  IV vanc and zosyn  Labs Reviewed  CBC WITH DIFFERENTIAL - Abnormal; Notable for the following:    WBC 17.1 (*)     Neutrophils Relative 81 (*)     Neutro Abs 13.9 (*)     Lymphocytes Relative 11 (*)     Monocytes Absolute 1.3 (*)     All other components within normal limits  BASIC METABOLIC PANEL - Abnormal; Notable for the following:    Glucose, Bld 268 (*)     BUN 32 (*)     Creatinine, Ser 1.43 (*)     GFR calc non Af Amer 38 (*)     GFR calc Af Amer 43 (*)     All other components within normal limits   Dg Foot Complete Left  10/04/2011  *RADIOLOGY REPORT*  Clinical Data: Wound infection  LEFT FOOT - COMPLETE 3+ VIEW  Comparison: 10/08/2010  Findings: Small calcaneal spur at the plantar aponeurosis.  There is subcutaneous gas at the plantar aspect of the first MTP joint. No definite focal cortical loss to suggest osteomyelitis.  Mild hallux valgus deformity.  No fracture or other acute bony abnormality.  IMPRESSION:  1.  Subcutaneous gas at the plantar aspect of the first MTP joint without radiographic evidence of osteomyelitis.  Original Report Authenticated By: Dillard Cannon III, M.D.     1. Diabetic foot infection       MDM  [facility] to admit for diabetic food infection with Dr. Mardelle Matte consulting for ortho surgery.         [place], [place] 10/04/11 1427

## 2011-10-05 DIAGNOSIS — N183 Chronic kidney disease, stage 3 (moderate): Secondary | ICD-10-CM

## 2011-10-05 DIAGNOSIS — E118 Type 2 diabetes mellitus with unspecified complications: Secondary | ICD-10-CM

## 2011-10-05 DIAGNOSIS — L97909 Non-pressure chronic ulcer of unspecified part of unspecified lower leg with unspecified severity: Secondary | ICD-10-CM

## 2011-10-05 DIAGNOSIS — L03818 Cellulitis of other sites: Secondary | ICD-10-CM

## 2011-10-05 DIAGNOSIS — I2510 Atherosclerotic heart disease of native coronary artery without angina pectoris: Secondary | ICD-10-CM

## 2011-10-05 DIAGNOSIS — L02818 Cutaneous abscess of other sites: Secondary | ICD-10-CM

## 2011-10-05 DIAGNOSIS — E1165 Type 2 diabetes mellitus with hyperglycemia: Secondary | ICD-10-CM

## 2011-10-05 LAB — CBC
MCV: 87 fL (ref 78.0–100.0)
Platelets: 201 10*3/uL (ref 150–400)
RDW: 13.4 % (ref 11.5–15.5)
WBC: 10.9 10*3/uL — ABNORMAL HIGH (ref 4.0–10.5)

## 2011-10-05 LAB — BASIC METABOLIC PANEL
CO2: 28 mEq/L (ref 19–32)
Calcium: 9.1 mg/dL (ref 8.4–10.5)
Creatinine, Ser: 1.71 mg/dL — ABNORMAL HIGH (ref 0.50–1.10)
GFR calc Af Amer: 35 mL/min — ABNORMAL LOW (ref >90)
GFR calc non Af Amer: 30 mL/min — ABNORMAL LOW (ref >90)

## 2011-10-05 LAB — MRSA PCR SCREENING: MRSA by PCR: NEGATIVE

## 2011-10-05 LAB — GLUCOSE, CAPILLARY: Glucose-Capillary: 125 mg/dL — ABNORMAL HIGH (ref 70–99)

## 2011-10-05 MED ORDER — HYDROCHLOROTHIAZIDE 12.5 MG PO CAPS: 12.5000 mg | ORAL_CAPSULE | ORAL | Status: DC

## 2011-10-05 MED ORDER — SODIUM CHLORIDE 0.9 % IV SOLN: 250.0000 mL | INTRAVENOUS | Status: DC | PRN

## 2011-10-05 MED ORDER — ASPIRIN 81 MG PO CHEW
81.0000 mg | CHEWABLE_TABLET | Freq: Once | ORAL | Status: AC
  Administered 2011-10-05: 81 mg via ORAL
  Filled 2011-10-05: qty 1

## 2011-10-05 MED ORDER — IRBESARTAN 75 MG PO TABS: 75.0000 mg | ORAL_TABLET | Freq: Every day | ORAL | Status: DC

## 2011-10-05 NOTE — Progress Notes (Signed)
Pt has a history of MRSA, Nasal swab collected and sent to lab.  Pt placed on contact precautions pending nasal swab results. Pt education completed with teach back method. Darreld Mclean [facility]

## 2011-10-05 NOTE — Progress Notes (Signed)
[facility] PROGRESS NOTE  [name] [id number] DOB: 25-Mar-1946 DOA: 10/04/2011   Assessment/Plan: Patient Active Hospital Problem List: Foot ulcer, left (10/04/2011) -patient has remained afebrile with white cell count improving. Antibiotics started on 10/04/2011 vancomycin and Zosyn cultures of abscess are pending at this time.  -Hemoglobin A1c 10.9 needs better control of her blood glucose as an outpatient.   Acute kidney injury: I have started on IV fluids continue to hold her ACE and her lisinopril.-Check a basic metabolic and panel in the morning creatinine has trended up, the patient is currently on vancomycin. She took her metformin before coming to the hospital.  Cellulitis of left lower extremity (10/05/2011) -vancomycin and Zosyn should help with the infection. Blood cultures continued to be negative.  DM (06/20/2009) -here in the hospital her blood glucose was actually been well controlled on Lantus plus sliding scale blood glucose ranging from 161-125.  HYPERTENSION (06/20/2009) -blood pressure is improved, the patient is currently not in any pain. We'll go ahead and titrate blood pressure medications for goal less than 130/80.  PAD (peripheral artery disease) (10/17/2010) -agree with ABIs are currently pending.    Code Status: Full code Family Communication: Patient Disposition Plan: Inpatient  Bess Harvest, MD  [facility] Pager [phone]  If 7PM-7AM, please contact night-coverage [email or web address] Password [email or web address] 10/05/2011, 11:17 AM   LOS: 1 day   Procedures:    Antibiotics:  Vancomycin and Zosyn 10/05/2011   Subjective: No complain  Objective: Filed Vitals:   10/04/11 1207 10/04/11 1550 10/04/11 2058 10/05/11 0524  BP: 171/76 166/79 157/77 141/76  Pulse: 83 85 83 74  Temp: 98.7 F (37.1 C) 98.5 F (36.9 C) 98.4 F (36.9 C) 98 F (36.7 C)  TempSrc: Oral Oral Oral Oral  Resp: 18 18 17 17   Height:   5' 4.8" (1.646 m)     Weight:   81.874 kg (180 lb 8 oz) 82.509 kg (181 lb 14.4 oz)  SpO2: 99% 99% 97% 99%    Intake/Output Summary (Last 24 hours) at 10/05/11 1117 Last data filed at 10/05/11 0700  Gross per 24 hour  Intake    925 ml  Output    425 ml  Net    500 ml   Weight change:   Exam:  General: Alert, awake, oriented x3, in no acute distress.  HEENT: No bruits, no goiter.  Heart: Regular rate and rhythm, without murmurs, rubs, gallops.  Lungs: Good air movement, bilateral air movement.  Abdomen: Soft, nontender, nondistended, positive bowel sounds.  Neuro: Grossly intact, nonfocal.   Data Reviewed: Basic Metabolic Panel:  Lab [id number] 10/04/11 1700 10/04/11 1313  NA 139 -- 137  K 3.6 -- 4.5  CL 101 -- 97  CO2 28 -- 26  GLUCOSE 122* -- 268*  BUN 35* -- 32*  CREATININE 1.71* 1.56* 1.43*  CALCIUM 9.1 -- 9.8  MG -- -- --  PHOS -- -- --   Liver Function Tests: No results found for this basename: AST:5,ALT:5,ALKPHOS:5,BILITOT:5,PROT:5,ALBUMIN:5 in the last 168 hours No results found for this basename: LIPASE:5,AMYLASE:5 in the last 168 hours No results found for this basename: AMMONIA:5 in the last 168 hours CBC:  Lab 10/05/11 0655 10/04/11 1700 10/04/11 1313  WBC 10.9* 14.6* 17.1*  NEUTROABS -- -- 13.9*  HGB 12.6 13.1 14.6  HCT 38.3 38.6 42.8  MCV 87.0 86.0 85.6  PLT 201 213 227   Cardiac Enzymes: No results found for this basename: CKTOTAL:5,CKMB:5,CKMBINDEX:5,TROPONINI:5 in the last 168 hours BNP: No  components found with this basename: POCBNP:5 CBG:  Lab 10/05/11 0750 10/04/11 2145 10/04/11 1651  GLUCAP 125* 161* 320*    Recent Results (from the past 240 hour(s))  CULTURE, ROUTINE-ABSCESS     Status: Normal (Preliminary result)   Collection Time   10/04/11  6:49 PM      Component Value Range Status Comment   Specimen Description ABSCESS LEFT TOE   Final    Special Requests NONE   Final    Gram Stain PENDING   Incomplete    Culture Culture reincubated for  better growth   Final    Report Status PENDING   Incomplete   MRSA PCR SCREENING     Status: Normal   Collection Time   10/04/11 11:50 PM      Component Value Range Status Comment   MRSA by PCR NEGATIVE  NEGATIVE Final      Studies: Dg Foot Complete Left  10/04/2011  *RADIOLOGY REPORT*  Clinical Data: Wound infection  LEFT FOOT - COMPLETE 3+ VIEW  Comparison: 10/08/2010  Findings: Small calcaneal spur at the plantar aponeurosis.  There is subcutaneous gas at the plantar aspect of the first MTP joint. No definite focal cortical loss to suggest osteomyelitis.  Mild hallux valgus deformity.  No fracture or other acute bony abnormality.  IMPRESSION:  1.  Subcutaneous gas at the plantar aspect of the first MTP joint without radiographic evidence of osteomyelitis.  Original Report Authenticated By: Dillard Cannon III, M.D.    Scheduled Meds:    . aspirin  325 mg Oral QHS  . atorvastatin  40 mg Oral q1800  . cholecalciferol  2,000 Units Oral Daily  . collagenase   Topical Daily  . docusate sodium  100 mg Oral BID  . enoxaparin  40 mg Subcutaneous Q24H  . glimepiride  4 mg Oral BID WC  . insulin aspart  0-9 Units Subcutaneous TID WC  . insulin glargine  15 Units Subcutaneous q morning - 10a  . metoprolol tartrate  25 mg Oral BID  . omega-3 acid ethyl esters  1 g Oral BID  . piperacillin-tazobactam (ZOSYN)  IV  3.375 g Intravenous Once  . piperacillin-tazobactam (ZOSYN)  IV  3.375 g Intravenous Q8H  . sodium chloride  500 mL Intravenous Once  . sodium chloride  3 mL Intravenous Q12H  . vancomycin  1,250 mg Intravenous Q24H  . vancomycin  1,000 mg Intravenous Once  . DISCONTD: sodium chloride   Intravenous STAT  . DISCONTD: fish oil-omega-3 fatty acids  1 g Oral BID  . DISCONTD: Melatonin  5 mg Oral QHS   Continuous Infusions:

## 2011-10-05 NOTE — Plan of Care (Signed)
Problem: Phase I Progression Outcomes Goal: Initial discharge plan identified Outcome: Completed/Met Date Met:  10/05/11 Pt to return home when medically cleared

## 2011-10-05 NOTE — Progress Notes (Signed)
VASCULAR LAB PRELIMINARY  ARTERIAL  ABI completed:  Right ABI within normal limits.  Left ABI indicates severe loss of flow.  This is a decrease from 0.6 (moderate decrease) on 03-14-11.    RIGHT    LEFT    PRESSURE WAVEFORM  PRESSURE WAVEFORM  BRACHIAL 179  tri BRACHIAL 182 tri  DP   DP    AT 184 mono AT 90 mono  PT 164 mono PT 80 mono  PER   PER    GREAT TOE  NA GREAT TOE  NA    RIGHT LEFT  ABI 1.01 0.49     [name], RVT 10/05/2011, 12:03 PM

## 2011-10-05 NOTE — Progress Notes (Signed)
Nasal MRSA PCR negative contact precautions discontinued per protocol. [name] [facility]

## 2011-10-06 DIAGNOSIS — N179 Acute kidney failure, unspecified: Secondary | ICD-10-CM

## 2011-10-06 DIAGNOSIS — E1169 Type 2 diabetes mellitus with other specified complication: Secondary | ICD-10-CM

## 2011-10-06 DIAGNOSIS — L089 Local infection of the skin and subcutaneous tissue, unspecified: Secondary | ICD-10-CM

## 2011-10-06 DIAGNOSIS — L03119 Cellulitis of unspecified part of limb: Secondary | ICD-10-CM

## 2011-10-06 LAB — BASIC METABOLIC PANEL
BUN: 32 mg/dL — ABNORMAL HIGH (ref 6–23)
CO2: 23 mEq/L (ref 19–32)
Chloride: 101 mEq/L (ref 96–112)
Glucose, Bld: 267 mg/dL — ABNORMAL HIGH (ref 70–99)
Potassium: 4.2 mEq/L (ref 3.5–5.1)
Sodium: 137 mEq/L (ref 135–145)

## 2011-10-06 LAB — GLUCOSE, CAPILLARY: Glucose-Capillary: 231 mg/dL — ABNORMAL HIGH (ref 70–99)

## 2011-10-06 MED ORDER — INSULIN GLARGINE 100 UNIT/ML ~~LOC~~ SOLN: 35.0000 [IU] | Freq: Every morning | SUBCUTANEOUS | Status: DC

## 2011-10-06 MED ORDER — SODIUM CHLORIDE 0.9 % IV SOLN: INTRAVENOUS | Status: DC | PRN

## 2011-10-06 MED ORDER — MAGNESIUM GLUCONATE 500 MG PO TABS
500.0000 mg | ORAL_TABLET | Freq: Two times a day (BID) | ORAL | Status: DC
  Filled 2011-10-06 (×2): qty 1

## 2011-10-06 MED ORDER — HYDROCODONE-ACETAMINOPHEN 5-325 MG PO TABS: 1.0000 | ORAL_TABLET | ORAL | Status: AC | PRN

## 2011-10-06 MED ORDER — POLYETHYLENE GLYCOL 3350 17 GM/SCOOP PO POWD: 17.0000 g | Freq: Every day | ORAL | Status: AC

## 2011-10-06 MED ORDER — DOXYCYCLINE HYCLATE 100 MG PO TABS: 100.0000 mg | ORAL_TABLET | Freq: Two times a day (BID) | ORAL | Status: AC

## 2011-10-06 MED ORDER — DOXYCYCLINE HYCLATE 100 MG IV SOLR
100.0000 mg | Freq: Once | INTRAVENOUS | Status: AC
  Administered 2011-10-06: 100 mg via INTRAVENOUS
  Filled 2011-10-06: qty 100

## 2011-10-06 MED ORDER — MAGNESIUM OXIDE 400 (241.3 MG) MG PO TABS
400.0000 mg | ORAL_TABLET | Freq: Two times a day (BID) | ORAL | Status: DC
  Administered 2011-10-06: 400 mg via ORAL
  Filled 2011-10-06 (×2): qty 1

## 2011-10-06 MED ORDER — DOXYCYCLINE HYCLATE 100 MG PO TABS
100.0000 mg | ORAL_TABLET | Freq: Two times a day (BID) | ORAL | Status: DC
  Filled 2011-10-06: qty 1

## 2011-10-06 MED ORDER — INSULIN GLARGINE 100 UNIT/ML ~~LOC~~ SOLN: 30.0000 [IU] | Freq: Every morning | SUBCUTANEOUS | Status: DC

## 2011-10-06 NOTE — Discharge Summary (Signed)
Patient ID: [name] MRN: [id number] DOB/AGE: 1946/02/19 66 y.o.  Admit date: 10/04/2011 Discharge date: 10/06/2011  Primary Care Physician:  Rachel Moulds, DO  Discharge Diagnoses:   Present on Admission:  Principal Problem:  *Foot ulcer, left   Acute kidney injury   DM - Uncontrolled   HYPERTENSION   PAD (peripheral artery disease)   Cellulitis of left lower extremity   Consults: Orthopedic surgery, Dr. Meridee Score. Procedures: Great left toe ulceration debridement, with MTP joint abscess decompression by Dr. Sharol Given  Medication List  As of 10/06/2011 11:46 AM   STOP taking these medications         hydrochlorothiazide 12.5 MG capsule      Magnesium 500 MG Tabs      metFORMIN 500 MG tablet      valsartan 160 MG tablet         TAKE these medications         aspirin 325 MG tablet   Take 325 mg by mouth at bedtime.      cholecalciferol 1000 UNITS tablet   Commonly known as: VITAMIN D   Take 2,000 Units by mouth daily.      doxycycline 100 MG tablet   Commonly known as: VIBRA-TABS   Take 1 tablet (100 mg total) by mouth 2 (two) times daily.      fish oil-omega-3 fatty acids 1000 MG capsule   Take 1 g by mouth 2 (two) times daily.      glimepiride 4 MG tablet   Commonly known as: AMARYL   Take 4 mg by mouth 2 (two) times daily.      HYDROcodone-acetaminophen 5-325 MG per tablet   Commonly known as: NORCO   Take 1-2 tablets by mouth every 4 (four) hours as needed. Do not take when taking tylenol.  Take only for severe pain.  This medication worsens constipation.      insulin glargine 100 UNIT/ML injection   Commonly known as: LANTUS   Inject 30-40 Units into the skin every morning. <140 = 20 units  140-200 = 30 units   >200 = 40 units      Melatonin 5 MG Caps   Take 5 mg by mouth at bedtime.      metoprolol tartrate 25 MG tablet   Commonly known as: LOPRESSOR   Take 25 mg by mouth 2 (two) times daily.      polyethylene glycol powder powder   Commonly known as: GLYCOLAX/MIRALAX   Take 17 g by mouth daily.      rosuvastatin 20 MG tablet   Commonly known as: CRESTOR   Take 20 mg by mouth daily.            From the admission note:  Chief Complaint: Left foot pain and redness.  HPI: Very pleasant 66 year old woman with PMH significant for PVD, Diabetes, HTN, CAD s/p CABG who presents to ED complaining of left foot redness, swelling and pain. She has Callous left foot for several months. She notice redness, worsening pain site callous day prior to admission. She also notice black discoloration site of callous. She notices redness left foot. She denies fevers, chills. She has prior history of left medial foot infection which resolved.  She has history of PVD. She was suppose to follow up with Dr Bridgett Larsson for ABI earlier June. She denies worsening symptoms of her claudication. Her last Hb A1c was in February and it was at 10.   1.  Ulceration and cellulitis of  Great Left toe with chronic gout.  Orthopedic Surgeon, Dr. Sharol Given saw the patient in consultation on 6/29 and debrided the area and decompressed the abscess in the left great toe MTP joint.  He started santyl dressing changed and IV antibiotics.  Wound cultures revealed moderate enterococcus. Dr. Sharol Given narrowed the antibiotics and recommended Ms. Lehrke be discharged on doxycycline.  Further, he recommended that she be strictly non-weightbearing on the left foot.  She will be discharged with 12 more days of oral doxycycline, and a rolling walker.  She will see Dr. Sharol Given in the office in 2 weeks.  2. Severe peripheral vascular disease.  Ankle-brachial indices were performed as part of the workup for her great toe ulceration.  Her right ABI was found to be within normal limits. Her left ABI indicated severe loss of flow. Left ABI was 0.49 representing a decrease from 0.6 on 03/14/2011.  She is to see Dr. Bridgett Larsson of vascular surgery for followup regarding her peripheral vascular disease.  3.   Uncontrolled diabetes mellitus. The patient had a hemoglobin A1c of 10 in February 2013.  On 10/04/2011 her hemoglobin A1c was 10.8.  Consequently her diabetes has been uncontrolled for some time. Her metformin had to be discontinued temporarily during this admission as her creatinine rose. Her Lantus insulin was increased from 15 units to 35-40 units in order to control her blood sugars and compensate for discontinuing her metformin. Request that she follow up with her primary care physician Dr. Johnnye Sima with regards to her diabetic management.  4.  Acute on chronic kidney disease.  Ms. Anson baseline creatinine was reported as 1.3. During her hospitalization she was placed on vancomycin for her cellulitis. Her creatinine rose and on June 30 was 1.7. Her vancomycin was discontinued and she was placed on doxycycline. Further, the patient was on multiple other medications that could be contributing to a rising creatinine including metformin, HCTZ, Diovan, and magnesium.  We have asked her to stop taking these medications until she is seen by her primary care physician in one week. We have also called Dr. Etheleen Nicks office and requested a followup nephrology appointment.  5.  Hypertension.  The patient was on multiple blood pressure medications including metoprolol, Diovan, HCTZ. Her HCTZ and Diovan were held due to acute kidney failure. Yet her blood pressure remained under reasonable level (systolic between [id number] and [id number]).  I believe when she is discharged from the hospital and is off of IV fluids in her home environment her blood pressure will further decrease. Consequently we have asked that she continue to take her metoprolol but also continue to hold her HCT Z. and Diovan until she sees her primary care physician and has a bmet checked  Physical Exam on Discharge: General: Alert, awake, oriented x3, in no acute distress. HEENT: No bruits, no goiter. Heart: Regular rate and rhythm, without murmurs,  rubs, gallops. Lungs: Clear to auscultation bilaterally. Abdomen: Soft, nontender, nondistended, positive bowel sounds. Extremities: No clubbing cyanosis or edema with positive pedal pulses.  LLE bandaged (clean and dry).  No red streaking above the gauze. Neuro: Grossly intact, nonfocal.  Filed Vitals:   10/05/11 0524 10/05/11 1520 10/05/11 2057 10/06/11 0454  BP: 141/76 163/85 151/79 146/75  Pulse: 74 78 69 64  Temp: 98 F (36.7 C) 98.4 F (36.9 C) 98.3 F (36.8 C) 98.9 F (37.2 C)  TempSrc: Oral Oral Oral Oral  Resp: 17 18 17 17   Height:      Weight: 82.509 kg (181  lb 14.4 oz)   81.375 kg (179 lb 6.4 oz)  SpO2: 99% 99% 97% 94%     Intake/Output Summary (Last 24 hours) at 10/06/11 1253 Last data filed at 10/06/11 0900  Gross per 24 hour  Intake    578 ml  Output      0 ml  Net    578 ml    Basic Metabolic Panel:  Lab [id number] 0915 10/05/11 0655  NA 137 139  K 4.2 3.6  CL 101 101  CO2 23 28  GLUCOSE 267* 122*  BUN 32* 35*  CREATININE 1.62* 1.71*  CALCIUM 9.3 9.1  MG -- --  PHOS -- --   CBC:  Lab 10/05/11 0655 31-Oct-2011 1700 October 31, 2011 1313  WBC 10.9* 14.6* --  NEUTROABS -- -- 13.9*  HGB 12.6 13.1 --  HCT 38.3 38.6 --  MCV 87.0 86.0 --  PLT 201 213 --   CBG:  Lab 10/06/11 1210 10/06/11 0740 10/05/11 2130 10/05/11 1707 10/05/11 1214 10/05/11 0750  GLUCAP 356* 144* 231* 166* 252* 125*   Hemoglobin A1C:  Lab 10-31-2011 1630  HGBA1C 10.8*    Micro Results: Results for orders placed during the hospital encounter of 31-Oct-2011  CULTURE, ROUTINE-ABSCESS     Status: Normal (Preliminary result)   Collection Time   2011-10-31  6:49 PM      Component Value Range Status Comment   Specimen Description ABSCESS LEFT TOE   Final    Special Requests NONE   Final    Gram Stain     Final    Value: NO WBC SEEN     NO SQUAMOUS EPITHELIAL CELLS SEEN     ABUNDANT GRAM POSITIVE COCCI IN PAIRS   Culture MODERATE ENTEROCOCCUS SPECIES   Final    Report Status PENDING    Incomplete   MRSA PCR SCREENING     Status: Normal   Collection Time   October 31, 2011 11:50 PM      Component Value Range Status Comment   MRSA by PCR NEGATIVE  NEGATIVE Final      Significant Diagnostic Studies:  Dg Foot Complete Left  Oct 31, 2011  *RADIOLOGY REPORT*  Clinical Data: Wound infection  LEFT FOOT - COMPLETE 3+ VIEW  Comparison: 10/08/2010  Findings: Small calcaneal spur at the plantar aponeurosis.  There is subcutaneous gas at the plantar aspect of the first MTP joint. No definite focal cortical loss to suggest osteomyelitis.  Mild hallux valgus deformity.  No fracture or other acute bony abnormality.  IMPRESSION:  1.  Subcutaneous gas at the plantar aspect of the first MTP joint without radiographic evidence of osteomyelitis.  Original Report Authenticated By: Trecia Rogers, M.D.      Disposition and Follow-up: stable for discharge to home with follow up listed below.  Discharge Orders    Future Orders Please Complete By Expires   Diet - low sodium heart healthy      Diet Carb Modified      Increase activity slowly      Discharge instructions      Comments:   Please check bmet for creatinine.  Multiple medications stopped for acute renal failure.   Call MD for:  persistant nausea and vomiting      Call MD for:  temperature >100.4        Follow-up Information    Follow up with DUDA,MARCUS V, MD in 2 weeks.   Contact information:   [place] [phone]  Follow up with STEVENSON, AMY, DO in 1 week.   Contact information:   [address] [place], [address] [place] bmet AND DIABETES.  MEDICATIONS HAVE BEEN CHANGED   Follow up with MATTINGLY,MICHAEL T, MD. (His office will call you with an appointment to be seen within 2 weeks.)    Contact information:   [address] [place] [phone]        Follow up with Hinda Lenis, MD. July 19 AT 12:45 PM    Contact information:   [address] [place] [phone]           Time spent on Discharged:  40 min.  SignedMelton Alar 10/06/2011, 12:53 PM [phone]

## 2011-10-06 NOTE — Discharge Summary (Signed)
-  Continue by mouth Doxy, she will followup with Dr. Lillie Fragmin as an outpatient. She'll also follow with Dr. Radene Knee as an outpatient to evaluate her ABIs. -I agree with increasing her insulin. Needs to followup with her primary care Dr. and start metformin at bedtime. -Primary care Dr. to start her hydrochlorothiazide and ARB as an outpatient.

## 2011-10-06 NOTE — Progress Notes (Signed)
Patient ID: [name], female   DOB: 02-22-1946, 66 y.o.   MRN: [id number] Cellulitis improved left great toe.  Ulcer ischemic, I'll f/u as outpatient, continue santyl, strict NWB left, OK to d/c on doxycycline.

## 2011-10-06 NOTE — Evaluation (Signed)
Physical Therapy Evaluation Patient Details Name: [name] MRN: [id number] DOB: [birth year] Today's Date: 10/06/2011 Time: [id number] PT Time Calculation (min): 21 min  PT Assessment / Plan / Recommendation Clinical Impression  Pt adm with cellulitis and ulcer on lt foot.  Pt is NWB on LLE.  Pt able to maintain NWB for short distances.  Will need Rolling walker for home.  Amb distance will be limited.    PT Assessment  Patient needs continued PT services    Follow Up Recommendations  No PT follow up    Barriers to Discharge        Equipment Recommendations  Rolling walker with 5" wheels    Recommendations for Other Services     Frequency Min 3X/week    Precautions / Restrictions Precautions Precautions: Fall Restrictions Weight Bearing Restrictions: Yes LLE Weight Bearing: Non weight bearing   Pertinent Vitals/Pain N/A      Mobility  Transfers Transfers: Sit to Stand;Stand to Sit Sit to Stand: 6: Modified independent (Device/Increase time);From bed;With upper extremity assist Stand to Sit: 6: Modified independent (Device/Increase time);With upper extremity assist;To chair/3-in-1 Ambulation/Gait Ambulation/Gait Assistance: 5: Supervision Ambulation Distance (Feet): 15 Feet Assistive device: Rolling walker Ambulation/Gait Assistance Details: Initial verbal cues for technique. Gait Pattern: Step-to pattern    Exercises     PT Diagnosis: Difficulty walking  PT Problem List: Decreased mobility;Decreased knowledge of use of DME PT Treatment Interventions: DME instruction;Gait training;Stair training;Functional mobility training;Therapeutic activities;Therapeutic exercise;Patient/family education   PT Goals Acute Rehab PT Goals PT Goal Formulation: With patient Time For Goal Achievement: 10/13/11 Potential to Achieve Goals: Good Pt will Ambulate: 16 - 50 feet;with modified independence PT Goal: Ambulate - Progress: Goal set today Pt will Go Up / Down Stairs: 1-2  stairs;with min assist;with least restrictive assistive device PT Goal: Up/Down Stairs - Progress: Goal set today  Visit Information  Last PT Received On: 10/06/11 Assistance Needed: +1    Subjective Data  Subjective: Pt states she isn't supposed to put any weight on her foot. Patient Stated Goal: For foot to heal.   Prior Functioning  Home Living Lives With: Spouse Available Help at Discharge: Family;Available PRN/intermittently Type of Home: House Home Access: Stairs to enter [employer] of Steps: 2 Entrance Stairs-Rails: None Home Layout: Two level;Able to live on main level with bedroom/bathroom Home Adaptive Equipment: Other (comment) (hiking stick) Prior Function Level of Independence: Independent with assistive device(s) Able to Take Stairs?: Yes Driving: Yes Communication Communication: No difficulties    Cognition  Overall Cognitive Status: Appears within functional limits for tasks assessed/performed Arousal/Alertness: Awake/alert Orientation Level: Appears intact for tasks assessed Behavior During Session: [facility] for tasks performed    Extremity/Trunk Assessment Right Upper Extremity Assessment RUE ROM/Strength/Tone: [facility] for tasks assessed Left Upper Extremity Assessment LUE ROM/Strength/Tone: [facility] for tasks assessed Right Lower Extremity Assessment RLE ROM/Strength/Tone: [facility] for tasks assessed Left Lower Extremity Assessment LLE ROM/Strength/Tone: [facility] for tasks assessed   Balance Static Standing Balance Static Standing - Balance Support: Bilateral upper extremity supported (and NWB on lt leg) Static Standing - Level of Assistance: 6: Modified independent (Device/Increase time)  End of Session PT - End of Session Equipment Utilized During Treatment: Gait belt Activity Tolerance: Patient tolerated treatment well Patient left: in bed;with call bell/phone within reach Nurse Communication: Mobility status  GP     Selma Rodelo 10/06/2011, 12:22  PM  [employer] PT [phone]

## 2011-10-06 NOTE — Progress Notes (Signed)
[name] to be D/C'd Home per MD order.  Discussed with the patient and all questions fully answered.   [facility] Medication Instructions [id number]   Printed on:10/06/11 1210  Medication Information                    glimepiride (AMARYL) 4 MG tablet Take 4 mg by mouth 2 (two) times daily.            aspirin 325 MG tablet Take 325 mg by mouth at bedtime.            fish oil-omega-3 fatty acids 1000 MG capsule Take 1 g by mouth 2 (two) times daily.            Melatonin 5 MG CAPS Take 5 mg by mouth at bedtime.            cholecalciferol (VITAMIN D) 1000 UNITS tablet Take 2,000 Units by mouth daily.           metoprolol tartrate (LOPRESSOR) 25 MG tablet Take 25 mg by mouth 2 (two) times daily.           rosuvastatin (CRESTOR) 20 MG tablet Take 20 mg by mouth daily.           doxycycline (VIBRA-TABS) 100 MG tablet Take 1 tablet (100 mg total) by mouth 2 (two) times daily.           HYDROcodone-acetaminophen (NORCO) 5-325 MG per tablet Take 1-2 tablets by mouth every 4 (four) hours as needed. Do not take when taking tylenol.  Take only for severe pain.  This medication worsens constipation.           polyethylene glycol powder (MIRALAX) powder Take 17 g by mouth daily.           insulin glargine (LANTUS) 100 UNIT/ML injection Inject 30-40 Units into the skin every morning. <140 = 10 units 140-200 = 15 units  >200 = 20 units             VVS, Skin clean, dry and intact. Ulcer on left foot covered with kerlex and santyl ointment.  IV catheter discontinued intact. Site without signs and symptoms of complications. Dressing and pressure applied.  An After Visit Summary was printed and given to the patient. Patient escorted via [place], and D/C home via private auto.  [name] D 10/06/2011 12:10 PM

## 2011-10-06 NOTE — Care Management Note (Signed)
    Page 1 of 1   10/06/2011     10:55:01 AM   CARE MANAGEMENT NOTE 10/06/2011  Patient:  [name], [name]   Account Number:  [id number]  Date Initiated:  10/06/2011  Documentation initiated by:  Tomi Bamberger  Subjective/Objective Assessment:   dx foot ulcer  admit- lives with spouse.  pta indpendent.     Action/Plan:   Anticipated DC Date:  10/06/2011   Anticipated DC Plan:  HOME/SELF CARE      DC Planning Services  CM consult      PAC Choice  DURABLE MEDICAL EQUIPMENT   Choice offered to / List presented to:  C-1 Patient   DME arranged  Vassie Moselle      DME agency  [place].        Status of service:  Completed, signed off Medicare Important Message given?   (If response is "NO", the following Medicare IM given date fields will be blank) Date Medicare IM given:   Date Additional Medicare IM given:    Discharge Disposition:  HOME/SELF CARE  Per UR Regulation:  Reviewed for med. necessity/level of care/duration of stay  If discussed at [place] of Stay Meetings, dates discussed:    Comments:  10/06/11 10:50 Tomi Bamberger RN, BSN [phone] patient lives with spouse, pta independent, per physical therapy patient has no pt needs since non weight bearing, but she needs a rolling walker.  Spoke with patient.  I called [employer] and they stated they are not in the bidding for rolling wakers for medicare pateint's even though [employer] is supplement.  Patient states she would like to work with [facility] .  Referral made to [facility]  with [facility],   he will bring rolling walker up to patient's room, patient is for discharge today.

## 2011-10-06 NOTE — Discharge Instructions (Signed)
Santyl dressing change daily left great toe ulcer, nonweightbearing left foot

## 2011-10-07 DIAGNOSIS — I10 Essential (primary) hypertension: Secondary | ICD-10-CM | POA: Diagnosis not present

## 2011-10-07 LAB — CULTURE, ROUTINE-ABSCESS

## 2011-10-15 DIAGNOSIS — E1149 Type 2 diabetes mellitus with other diabetic neurological complication: Secondary | ICD-10-CM | POA: Diagnosis not present

## 2011-10-15 DIAGNOSIS — L97409 Non-pressure chronic ulcer of unspecified heel and midfoot with unspecified severity: Secondary | ICD-10-CM | POA: Diagnosis not present

## 2011-10-15 DIAGNOSIS — M1A9XX1 Chronic gout, unspecified, with tophus (tophi): Secondary | ICD-10-CM | POA: Diagnosis not present

## 2011-10-23 ENCOUNTER — Encounter: Payer: Self-pay | Admitting: Vascular Surgery

## 2011-10-24 ENCOUNTER — Ambulatory Visit (INDEPENDENT_AMBULATORY_CARE_PROVIDER_SITE_OTHER): Payer: Medicare Other | Admitting: Vascular Surgery

## 2011-10-24 ENCOUNTER — Encounter: Payer: Self-pay | Admitting: Vascular Surgery

## 2011-10-24 VITALS — BP 185/82 | HR 74 | Temp 98.4°F | Ht 64.0 in | Wt 179.0 lb

## 2011-10-24 DIAGNOSIS — I998 Other disorder of circulatory system: Secondary | ICD-10-CM

## 2011-10-24 DIAGNOSIS — Z01818 Encounter for other preprocedural examination: Secondary | ICD-10-CM

## 2011-10-24 DIAGNOSIS — I70219 Atherosclerosis of native arteries of extremities with intermittent claudication, unspecified extremity: Secondary | ICD-10-CM

## 2011-10-24 DIAGNOSIS — I999 Unspecified disorder of circulatory system: Secondary | ICD-10-CM

## 2011-10-24 NOTE — Progress Notes (Addendum)
[facility] [place]  Established Critical Limb Ischemia Patient  History of Present Illness  [name] is a 66 y.o. (04-06-1946) female who presents with chief complaint: left great toe ulcer.  Patient developed a "blood blister" over left great toe while recently admitted to the hospital.  The has seen Dr. Sharol Given and had debridement once to date.  She is undergoing Santyl wound debridement of the toe.  The patient has no rest pain.  Her previous L 5th MT ulcer has mostly healed.  The patient notes symptoms have not progressed.  The patient's treatment regimen currently included: maximal medical management and Santyl.  Past Medical History, Past Surgical History, Social History, Family History, Medications, Allergies, and Review of Systems are unchanged from previous evaluation on 12/13/10.  Physical Examination  Filed Vitals:   10/24/11 1242  BP: 185/82  Pulse: 74  Temp: 98.4 F (36.9 C)  TempSrc: Oral  Height: 5\' 4"  (1.626 m)  Weight: 179 lb (81.194 kg)  SpO2: 99%   Body mass index is 30.73 kg/(m^2).   General: A&O x 3, WDWN  Eyes: PERRLA, EOMI  Pulmonary: Sym exp, good air movt, CTAB, no rales, rhonchi, & wheezing  Cardiac: RRR, Nl S1, S2, no Murmurs, rubs or gallops  Vascular: Vessel Right Left  Radial Palpable Palpable  Brachial Palpable Palpable  Carotid Palpable, without bruit Palpable, without bruit  Aorta Non-palpable N/A  Femoral Palpable Palpable  Popliteal Non-palpable Non-palpable  PT Palpable Non-Palpable  DP Palpable Non-Palpable   Gastrointestinal: soft, NTND, -G/R, - HSM, - masses, - CVAT B  Musculoskeletal: M/S 5/5 throughout , Extremities without ischemic changes , old hyperkeratotic healed ulcer over lateral L 5th MT, medial great toe has some fibirnous exudate with good granulation in the base of the ulcer, no pus or smell  Neurologic: Pain and light touch intact in extremities , Motor exam as listed above  Medical Decision  Making  [name] is a 66 y.o. female who presents with: L great toe ulcer   The patient continues to be reluctant to proceed with L fem-pop BPG though I have discussed with her need to optimize her forefoot perfusion to help healing.  She has agreed to see her cardiologist for preop risk stratification and optimization in case proceeding with bypass is necessary  She will follow up with Dr. Sharol Given in 2 weeks and then she will see me a few days later.  At that time, she will make a final decision in regards to proceeding with a bypass.  I discussed in depth with the patient the nature of atherosclerosis, and emphasized the importance of maximal medical management including strict control of blood pressure, blood glucose, and lipid levels, antiplatelet agents, obtaining regular exercise, and cessation of smoking.  The patient is aware that without maximal medical management the underlying atherosclerotic disease process will progress, limiting the benefit of any interventions.  Thank you for allowing [name] to participate in this patient's care.  Adele Barthel, MD [facility] [place] Office: [phone] Pager: [phone]  10/24/2011, 5:13 PM

## 2011-11-04 ENCOUNTER — Telehealth: Payer: Self-pay | Admitting: Vascular Surgery

## 2011-11-04 ENCOUNTER — Ambulatory Visit: Payer: Medicare Other | Admitting: Physician Assistant

## 2011-11-04 NOTE — Telephone Encounter (Signed)
Patient was scheduled for 11/07/11 for a 2 week follow up with [facility]. I received a call from her today, 11/04/11. [name] wanted to reschedule her appointment from 11/07/11 to 12/05/11. She stated that she had several doctors that [facility] wanted her to see before returning to discuss futher treatment with him. She missed her cardiology evaluation and the other 2 appointments had been rescheduled to later dates.   [name] said that she would have all other consultations completed by 12/02/11, so I rescheduled her to 12/05/11 with [facility]. I will forward this message to Dr Bridgett Larsson as Juluis Rainier.

## 2011-11-07 ENCOUNTER — Ambulatory Visit: Payer: Medicare Other | Admitting: Vascular Surgery

## 2011-11-11 ENCOUNTER — Encounter: Payer: Self-pay | Admitting: Physician Assistant

## 2011-11-11 ENCOUNTER — Ambulatory Visit (INDEPENDENT_AMBULATORY_CARE_PROVIDER_SITE_OTHER): Payer: Medicare Other | Admitting: Physician Assistant

## 2011-11-11 VITALS — BP 164/98 | HR 73 | Wt 172.8 lb

## 2011-11-11 DIAGNOSIS — E785 Hyperlipidemia, unspecified: Secondary | ICD-10-CM

## 2011-11-11 DIAGNOSIS — E1149 Type 2 diabetes mellitus with other diabetic neurological complication: Secondary | ICD-10-CM | POA: Diagnosis not present

## 2011-11-11 DIAGNOSIS — I255 Ischemic cardiomyopathy: Secondary | ICD-10-CM

## 2011-11-11 DIAGNOSIS — E119 Type 2 diabetes mellitus without complications: Secondary | ICD-10-CM

## 2011-11-11 DIAGNOSIS — I2510 Atherosclerotic heart disease of native coronary artery without angina pectoris: Secondary | ICD-10-CM

## 2011-11-11 DIAGNOSIS — I2589 Other forms of chronic ischemic heart disease: Secondary | ICD-10-CM

## 2011-11-11 DIAGNOSIS — M1A9XX1 Chronic gout, unspecified, with tophus (tophi): Secondary | ICD-10-CM | POA: Diagnosis not present

## 2011-11-11 DIAGNOSIS — N189 Chronic kidney disease, unspecified: Secondary | ICD-10-CM

## 2011-11-11 DIAGNOSIS — I10 Essential (primary) hypertension: Secondary | ICD-10-CM

## 2011-11-11 DIAGNOSIS — Z01810 Encounter for preprocedural cardiovascular examination: Secondary | ICD-10-CM

## 2011-11-11 DIAGNOSIS — L97409 Non-pressure chronic ulcer of unspecified heel and midfoot with unspecified severity: Secondary | ICD-10-CM | POA: Diagnosis not present

## 2011-11-11 DIAGNOSIS — M25569 Pain in unspecified knee: Secondary | ICD-10-CM | POA: Diagnosis not present

## 2011-11-11 MED ORDER — CARVEDILOL 12.5 MG PO TABS: 12.5000 mg | ORAL_TABLET | Freq: Two times a day (BID) | ORAL | Status: DC

## 2011-11-11 NOTE — Patient Instructions (Addendum)
Your physician wants you to follow-up in: 4 months with DR. Blima Singer will receive a reminder letter in the mail two months in advance. If you don't receive a letter, please call our office to schedule the follow-up appointment.   Your physician has recommended you make the following change in your medication: STOP METOPROLOL;  START COREG 12.5 MG 1 TABLET TWICE DAILY   CHECK YOUR BLOOD PRESSURE OVER THE NEXT 2 WEEKS @ [facility] TO [address], [place], [place]

## 2011-11-11 NOTE — Progress Notes (Signed)
[name], [place]  [id number] Phone: [phone] Fax:  [phone]  Date:  11/11/2011   Name:  [name]   DOB:  1945-12-11   MRN:  [id number]  PCP:  Alesia Richards, MD  Primary Cardiologist:  Dr. Jenkins Rouge  Primary Electrophysiologist:  None    History of Present Illness: [name] is a 66 y.o. female who returns for surgical clearance.  She has a history of CAD, status post NSTEMI in 05/2009, DM2, HTN, HL, PAD.  LHC at the time of her MI demonstrated severe two-vessel CAD and an ejection fraction of 45%. She successfully underwent CABG (LIMA-LAD, SVG-diagonal, SVG-OM1/OM 2). Echo 07/2009: Mild LVH, EF 40-45%, inferior and posterior HK, grade 1 diastolic dysfunction, moderate MAC, mild to moderate MR, mild LAE.  She had been seen in the past by Dr. Lauree Chandler for a left foot wound. Angiogram demonstrated chronic occlusion of the left SFA with diffuse below the knee disease. Last seen by Dr. Johnsie Cancel 12/2010.  She was recently seen in followup by vascular surgery for a left great toe ulcer. She was admitted at the end of June and placed on IV antibiotics. She was noted to have acute on chronic renal failure and her ARB and HCTZ were both discontinued.   She is seen today for risk stratification in the event that a right femoral-pop bypass graft is chosen in the near future for improving perfusion to her foot.  Prior to her ulcer on her foot, she was doing fairly well. She remained active without chest pain or shortness of breath. She was able to achieve 4 METs without angina. She denies any recent syncope or near-syncope. She denies significant pedal edema. She denies orthopnea or PND. Her blood pressures at home are uncontrolled. She notes readings of 160-180 over 70s to 80s.  Wt Readings from Last 3 Encounters:  11/11/11 172 lb 12.8 oz (78.382 kg)  10/24/11 179 lb (81.194 kg)  10/06/11 179 lb 6.4 oz (81.375 kg)     Past Medical  History  Diagnosis Date  . Hypertension   . Coronary artery disease     NSTEMI in 05/2009;  LHC with 2 v CAD - > CABG (LIMA-LAD, SVG-diagonal, SVG-OM1/OM 2).   . Neuropathy   . Diabetes mellitus   . Hyperlipidemia   . Ischemic cardiomyopathy     Echo 07/2009: Mild LVH, EF 40-45%, inferior and posterior HK, grade 1 diastolic dysfunction, moderate MAC, mild to moderate MR, mild LAE  . Arthritis   . Ulcer   . PAD (peripheral artery disease)   . CKD (chronic kidney disease)     Current Outpatient Prescriptions  Medication Sig Dispense Refill  . allopurinol (ZYLOPRIM) 100 MG tablet Take 100 mg by mouth 2 (two) times daily.      Marland Kitchen aspirin 325 MG tablet Take 325 mg by mouth at bedtime.       . cholecalciferol (VITAMIN D) 1000 UNITS tablet Take 2,000 Units by mouth daily.      Marland Kitchen COLCRYS 0.6 MG tablet Take 0.6 mg by mouth daily.      . fish oil-omega-3 fatty acids 1000 MG capsule Take 1 g by mouth 2 (two) times daily.       Marland Kitchen glimepiride (AMARYL) 4 MG tablet Take 4 mg by mouth 2 (two) times daily.       . insulin glargine (LANTUS) 100 UNIT/ML injection Inject 35 Units into the skin every morning. <140 = 10 units 140-200 =  15 units  >200 = 20 units  10 mL  0  . Melatonin 5 MG CAPS Take 5 mg by mouth at bedtime.       . metoprolol tartrate (LOPRESSOR) 25 MG tablet Take 25 mg by mouth. Two tablets bid      . polyethylene glycol powder (GLYCOLAX/MIRALAX) powder As needed      . rosuvastatin (CRESTOR) 20 MG tablet Take 20 mg by mouth daily.      Marland Kitchen SANTYL ointment Apply 1 application topically daily.      Marland Kitchen doxycycline (VIBRA-TABS) 100 MG tablet         Allergies: Allergies  Allergen Reactions  . Atenolol     Exacerbates gout  . Omnipaque (Iohexol) Rash    History  Substance Use Topics  . Smoking status: Never Smoker   . Smokeless tobacco: Never Used  . Alcohol Use: No     ROS:  Please see the history of present illness.    All other systems reviewed and negative.   PHYSICAL  EXAM: VS:  BP 164/98  Pulse 73  Wt 172 lb 12.8 oz (78.382 kg) Well nourished, well developed, in no acute distress HEENT: normal Neck: no JVD at 90 Cardiac:  normal S1, S2; RRR; no murmur Lungs:  clear to auscultation bilaterally, no wheezing, rhonchi or rales Abd: soft, nontender, no hepatomegaly Ext: no edema Skin: warm and dry Neuro:  CNs 2-12 intact, no focal abnormalities noted  EKG:  Sinus rhythm, heart rate 73, LBBB, PVCs      ASSESSMENT AND PLAN:  1. CAD She is a little over 2 years out since her bypass surgery. Before her foot wound, she was able to achieve 4 METs without angina. I reviewed her case today with Dr. Johnsie Cancel. She requires no further cardiac workup prior to her non-cardiac surgery. She should be acceptable risk. She should continue beta blocker throughout the perioperative period. Would recommend she remain on aspirin if possible. Our service is available in the perioperative period as necessary. Followup with Dr. Johnsie Cancel in 4 months.  2. Hypertension This is uncontrolled. I recommend that she stop her metoprolol. Start carvedilol 12.5 mg twice a day. She doesn't good job of checking her blood pressures at home. She will call me in 2 weeks to apprise me of her blood pressures. Check a basic metabolic panel today. Consider restarting ARB depending on her renal function.  3. Ischemic Cardiomyopathy Adjust beta blocker as noted. If her renal function will not allow restarting an ARB, consider hydralazine and nitrates in the future.  4. Diabetes Mellitus Managed by primary care. Overall, she would benefit from being on an ACE or ARB for renal protection. Repeat basic metabolic panel as noted.  5. Chronic Kidney Disease Followed by Dr. Mercy Moore.  6. PAD Followed by Dr. Bridgett Larsson. She sees Dr. Sharol Given as well for her ulcer.  7. Hyperlipidemia Continue Crestor.  Signed, Richardson Dopp, PA-C  4:00 PM 11/11/2011

## 2011-11-12 ENCOUNTER — Ambulatory Visit (INDEPENDENT_AMBULATORY_CARE_PROVIDER_SITE_OTHER): Payer: Medicare Other | Admitting: *Deleted

## 2011-11-12 DIAGNOSIS — I10 Essential (primary) hypertension: Secondary | ICD-10-CM | POA: Diagnosis not present

## 2011-11-12 DIAGNOSIS — E7800 Pure hypercholesterolemia, unspecified: Secondary | ICD-10-CM | POA: Diagnosis not present

## 2011-11-12 DIAGNOSIS — E785 Hyperlipidemia, unspecified: Secondary | ICD-10-CM

## 2011-11-12 LAB — BASIC METABOLIC PANEL
BUN: 25 mg/dL — ABNORMAL HIGH (ref 6–23)
Calcium: 9.1 mg/dL (ref 8.4–10.5)
Creatinine, Ser: 1.2 mg/dL (ref 0.4–1.2)
GFR: 46.88 mL/min — ABNORMAL LOW (ref >60.00)
Glucose, Bld: 316 mg/dL — ABNORMAL HIGH (ref 70–99)

## 2011-11-13 ENCOUNTER — Telehealth: Payer: Self-pay | Admitting: Physician Assistant

## 2011-11-13 DIAGNOSIS — I509 Heart failure, unspecified: Secondary | ICD-10-CM

## 2011-11-13 DIAGNOSIS — I10 Essential (primary) hypertension: Secondary | ICD-10-CM

## 2011-11-13 MED ORDER — CARVEDILOL 12.5 MG PO TABS: 12.5000 mg | ORAL_TABLET | Freq: Two times a day (BID) | ORAL | Status: DC

## 2011-11-13 MED ORDER — VALSARTAN 80 MG PO TABS: 80.0000 mg | ORAL_TABLET | Freq: Every day | ORAL | Status: DC

## 2011-11-13 NOTE — Telephone Encounter (Signed)
New msg Pt wants results of blood work from yesterday

## 2011-11-13 NOTE — Telephone Encounter (Signed)
pt notified about lab results and to restart diovan in 2 weeks, rx sent to [facility], bmet 8/30 and repeat bmet 12/12/11, pt gave verbal understanding to all instructions and asked for lab results to be mailed to her, I told her I will mail them tonight

## 2011-11-13 NOTE — Telephone Encounter (Signed)
Message copied by Michae Kava on Thu Nov 13, 2011  5:42 PM ------      Message from: [place], [place] T      Created: Wed Nov 12, 2011  4:56 PM       Renal fxn looks good.      Sugar high - follow up with PCP.      I think she should restart Diovan.        -  I want her to restart Diovan at 80 mg QD in 2 weeks from now.        -  Arrange BMET for one week and 2 weeks after starting Diovan.      Richardson Dopp, PA-C  4:55 PM 11/12/2011

## 2011-11-14 ENCOUNTER — Telehealth: Payer: Self-pay | Admitting: Cardiovascular Disease

## 2011-11-14 NOTE — Telephone Encounter (Signed)
Patient called stated she want receive coreg until tomorrow or Monday wants to know if ok to continue metoprolol until she gets coreg.Spoke to [place] ok to continue metoprolol until she gets coreg.

## 2011-11-14 NOTE — Telephone Encounter (Signed)
Pt calling re medication that was discussed with her at last visit

## 2011-11-27 DIAGNOSIS — E1149 Type 2 diabetes mellitus with other diabetic neurological complication: Secondary | ICD-10-CM | POA: Diagnosis not present

## 2011-11-27 DIAGNOSIS — L03119 Cellulitis of unspecified part of limb: Secondary | ICD-10-CM | POA: Diagnosis not present

## 2011-11-27 DIAGNOSIS — L02619 Cutaneous abscess of unspecified foot: Secondary | ICD-10-CM | POA: Diagnosis not present

## 2011-11-27 DIAGNOSIS — M1A9XX1 Chronic gout, unspecified, with tophus (tophi): Secondary | ICD-10-CM | POA: Diagnosis not present

## 2011-12-02 ENCOUNTER — Encounter: Payer: Self-pay | Admitting: Cardiovascular Disease

## 2011-12-03 ENCOUNTER — Telehealth: Payer: Self-pay | Admitting: Physician Assistant

## 2011-12-03 DIAGNOSIS — I509 Heart failure, unspecified: Secondary | ICD-10-CM

## 2011-12-03 MED ORDER — VALSARTAN 80 MG PO TABS: 80.0000 mg | ORAL_TABLET | Freq: Two times a day (BID) | ORAL | Status: DC

## 2011-12-03 MED ORDER — HYDROCHLOROTHIAZIDE 12.5 MG PO CAPS: 12.5000 mg | ORAL_CAPSULE | Freq: Every day | ORAL | Status: DC

## 2011-12-03 NOTE — Telephone Encounter (Signed)
pt said she did not know she had f/u lab 8/30 and 9/6 bmet. I went over coversation from 11/13/11, pt said Dr. Mercy Moore drew bmet 8/27, I will call their office and get results faxed to [name], pt back on diovan 80 bid, hctz 12.5 qd, med list updated today.  I told pt that I will d/w Richardson Dopp, [facility] that pt has 2 month f/u with Dr. Mercy Moore and whether or not if he still wants her to keep the bmet 9/6. I will get the bmet from 12/02/11 from Dr. Etheleen Nicks office

## 2011-12-03 NOTE — Telephone Encounter (Signed)
pt said she did not know she had f/u lab 8/30 and 9/6 bmet. I went over coversation from 11/13/11, pt said Dr. Mercy Moore drew bmet 8/27, I will call their office and get results faxed to [name], pt back on diovan 80 bid, hctz 12.5 qd, med list updated today

## 2011-12-03 NOTE — Telephone Encounter (Signed)
Please return call to pt [phone], she said she did not make appnts for BMET  And she was not aware she had any other tasks to complete at the practice.  Pt asked to speak with Arbie Cookey. Please return call

## 2011-12-04 ENCOUNTER — Encounter: Payer: Self-pay | Admitting: Vascular Surgery

## 2011-12-04 DIAGNOSIS — E1149 Type 2 diabetes mellitus with other diabetic neurological complication: Secondary | ICD-10-CM | POA: Diagnosis not present

## 2011-12-04 NOTE — Telephone Encounter (Signed)
I would prefer she have BMET drawn 1 week and 2 weeks after restarting the Diovan. If the BMET from 8/27 was drawn 2 weeks after restarting, no need to draw any labs on 8/30 or 9/6. If it was only a week after starting, I want her to get BMET on 9/6. Richardson Dopp, PA-C  4:48 PM 12/04/2011

## 2011-12-05 ENCOUNTER — Other Ambulatory Visit: Payer: Medicare Other

## 2011-12-05 ENCOUNTER — Encounter: Payer: Self-pay | Admitting: Vascular Surgery

## 2011-12-05 ENCOUNTER — Ambulatory Visit (INDEPENDENT_AMBULATORY_CARE_PROVIDER_SITE_OTHER): Payer: Medicare Other | Admitting: Vascular Surgery

## 2011-12-05 VITALS — BP 178/77 | HR 82 | Resp 16 | Ht 64.5 in | Wt 172.0 lb

## 2011-12-05 DIAGNOSIS — I70219 Atherosclerosis of native arteries of extremities with intermittent claudication, unspecified extremity: Secondary | ICD-10-CM

## 2011-12-05 DIAGNOSIS — Z48812 Encounter for surgical aftercare following surgery on the circulatory system: Secondary | ICD-10-CM | POA: Diagnosis not present

## 2011-12-05 NOTE — Telephone Encounter (Signed)
pt notified ok to have Dr. Mercy Moore follow kidney function (bmet), pt wants to try to have 1 dr follow lab work so there are not so many duplcates being done, ok per [employer]. PAC

## 2011-12-05 NOTE — Progress Notes (Signed)
[facility] [place]  Established Critical Limb Ischemia Patient  History of Present Illness  [name] is a 66 y.o. (October 08, 1945) female who presents with chief complaint: left toe ulcer.  Patient saw Dr. Jess Barters PA yesterday and they think it looks better.  The patient has no rest pain and wounds include: L toe .  The patient notes symptoms have not progressed.  The patient's treatment regimen currently included: maximal medical management and wound care.  The pt has been cleared by Dr. Johnsie Cancel for a fem-pop bypass if needed.  Past Medical History, Past Surgical History, Social History, Family History, Medications, Allergies, and Review of Systems are unchanged from previous evaluation on 10/24/11.  Physical Examination  Filed Vitals:   12/05/11 1030  BP: 178/77  Pulse: 82  Resp: 16  Height: 5' 4.5" (1.638 m)  Weight: 172 lb (78.019 kg)  SpO2: 100%   Body mass index is 29.07 kg/(m^2).   General: A&O x 3, WDWN  Eyes: PERRLA, EOMI  Pulmonary: Sym exp, good air movt, CTAB, no rales, rhonchi, & wheezing  Cardiac: RRR, Nl S1, S2, no Murmurs, rubs or gallops  Vascular: Vessel Right Left  Radial Palpable Palpable  Ulnar Palpable Palpable  Brachial Palpable Palpable  Carotid Palpable, without bruit Palpable, without bruit  Aorta Not palpable N/A  Femoral Palpable Palpable  Popliteal Not palpable Not palpable  PT Palpable Not Palpable  DP Palpable Palpable   Gastrointestinal: soft, NTND, -G/R, - HSM, - masses, - CVAT B  Musculoskeletal: M/S 5/5 throughout , Extremities without ischemic changes except L great toe ulcer with good granulation, small superficial ulceration over 5th MT, healing laceration over L lateral ankle  Neurologic: Pain and light touch intact in extremities , Motor exam as listed above  Medical Decision Making  [name] is a 66 y.o. female who presents with: L great toe ulcer and 5th MT superficial ulcer.   Patient continues  to be reluctant to proceed with Lfem-BK pop BPG w/ GSV.  As the ulcer appears healthy and healing, I don't see any pressing need to proceed with bypass.  Based on the patient's vascular studies and examination, I have offered the patient: repeat ABI in 3 months.  I discussed in depth with the patient the nature of atherosclerosis, and emphasized the importance of maximal medical management including strict control of blood pressure, blood glucose, and lipid levels, antiplatelet agents, obtaining regular exercise, and cessation of smoking.  The patient is aware that without maximal medical management the underlying atherosclerotic disease process will progress, limiting the benefit of any interventions.  Thank you for allowing [name] to participate in this patient's care.  Adele Barthel, MD [facility] [place] Office: [phone] Pager: [phone]  12/05/2011, 11:38 AM

## 2011-12-11 DIAGNOSIS — L02619 Cutaneous abscess of unspecified foot: Secondary | ICD-10-CM | POA: Diagnosis not present

## 2011-12-11 DIAGNOSIS — L03119 Cellulitis of unspecified part of limb: Secondary | ICD-10-CM | POA: Diagnosis not present

## 2011-12-11 DIAGNOSIS — E1149 Type 2 diabetes mellitus with other diabetic neurological complication: Secondary | ICD-10-CM | POA: Diagnosis not present

## 2011-12-11 DIAGNOSIS — L97409 Non-pressure chronic ulcer of unspecified heel and midfoot with unspecified severity: Secondary | ICD-10-CM | POA: Diagnosis not present

## 2011-12-12 ENCOUNTER — Other Ambulatory Visit: Payer: Medicare Other

## 2011-12-18 DIAGNOSIS — L97409 Non-pressure chronic ulcer of unspecified heel and midfoot with unspecified severity: Secondary | ICD-10-CM | POA: Diagnosis not present

## 2011-12-18 DIAGNOSIS — E1149 Type 2 diabetes mellitus with other diabetic neurological complication: Secondary | ICD-10-CM | POA: Diagnosis not present

## 2011-12-25 DIAGNOSIS — E1149 Type 2 diabetes mellitus with other diabetic neurological complication: Secondary | ICD-10-CM | POA: Diagnosis not present

## 2011-12-25 DIAGNOSIS — L97409 Non-pressure chronic ulcer of unspecified heel and midfoot with unspecified severity: Secondary | ICD-10-CM | POA: Diagnosis not present

## 2012-01-13 DIAGNOSIS — Z1212 Encounter for screening for malignant neoplasm of rectum: Secondary | ICD-10-CM | POA: Diagnosis not present

## 2012-01-13 DIAGNOSIS — E559 Vitamin D deficiency, unspecified: Secondary | ICD-10-CM | POA: Diagnosis not present

## 2012-01-13 DIAGNOSIS — M109 Gout, unspecified: Secondary | ICD-10-CM | POA: Diagnosis not present

## 2012-01-13 DIAGNOSIS — Z23 Encounter for immunization: Secondary | ICD-10-CM | POA: Diagnosis not present

## 2012-01-13 DIAGNOSIS — E109 Type 1 diabetes mellitus without complications: Secondary | ICD-10-CM | POA: Diagnosis not present

## 2012-01-13 DIAGNOSIS — E782 Mixed hyperlipidemia: Secondary | ICD-10-CM | POA: Diagnosis not present

## 2012-01-13 DIAGNOSIS — Z79899 Other long term (current) drug therapy: Secondary | ICD-10-CM | POA: Diagnosis not present

## 2012-01-13 DIAGNOSIS — I10 Essential (primary) hypertension: Secondary | ICD-10-CM | POA: Diagnosis not present

## 2012-01-14 DIAGNOSIS — E1149 Type 2 diabetes mellitus with other diabetic neurological complication: Secondary | ICD-10-CM | POA: Diagnosis not present

## 2012-01-14 DIAGNOSIS — L97409 Non-pressure chronic ulcer of unspecified heel and midfoot with unspecified severity: Secondary | ICD-10-CM | POA: Diagnosis not present

## 2012-02-09 ENCOUNTER — Encounter: Payer: Self-pay | Admitting: Cardiovascular Disease

## 2012-02-09 DIAGNOSIS — I129 Hypertensive chronic kidney disease with stage 1 through stage 4 chronic kidney disease, or unspecified chronic kidney disease: Secondary | ICD-10-CM | POA: Diagnosis not present

## 2012-02-09 DIAGNOSIS — I10 Essential (primary) hypertension: Secondary | ICD-10-CM | POA: Diagnosis not present

## 2012-02-09 DIAGNOSIS — E119 Type 2 diabetes mellitus without complications: Secondary | ICD-10-CM | POA: Diagnosis not present

## 2012-02-11 DIAGNOSIS — E1149 Type 2 diabetes mellitus with other diabetic neurological complication: Secondary | ICD-10-CM | POA: Diagnosis not present

## 2012-02-11 DIAGNOSIS — L02619 Cutaneous abscess of unspecified foot: Secondary | ICD-10-CM | POA: Diagnosis not present

## 2012-02-11 DIAGNOSIS — L97409 Non-pressure chronic ulcer of unspecified heel and midfoot with unspecified severity: Secondary | ICD-10-CM | POA: Diagnosis not present

## 2012-02-11 DIAGNOSIS — L03119 Cellulitis of unspecified part of limb: Secondary | ICD-10-CM | POA: Diagnosis not present

## 2012-03-12 ENCOUNTER — Ambulatory Visit: Payer: Medicare Other | Admitting: Vascular Surgery

## 2012-03-15 DIAGNOSIS — L97409 Non-pressure chronic ulcer of unspecified heel and midfoot with unspecified severity: Secondary | ICD-10-CM | POA: Diagnosis not present

## 2012-03-15 DIAGNOSIS — L02619 Cutaneous abscess of unspecified foot: Secondary | ICD-10-CM | POA: Diagnosis not present

## 2012-03-15 DIAGNOSIS — L03119 Cellulitis of unspecified part of limb: Secondary | ICD-10-CM | POA: Diagnosis not present

## 2012-03-15 DIAGNOSIS — E1149 Type 2 diabetes mellitus with other diabetic neurological complication: Secondary | ICD-10-CM | POA: Diagnosis not present

## 2012-03-23 ENCOUNTER — Telehealth: Payer: Self-pay | Admitting: Cardiovascular Disease

## 2012-03-23 NOTE — Telephone Encounter (Signed)
New Problem: ° ° ° °I called the patient and was unable to reach them. I left a message on their voicemail with my name, the reason I called, the name of their physician, and a number to call back to schedule their appointment. ° °

## 2012-04-09 ENCOUNTER — Other Ambulatory Visit: Payer: Self-pay

## 2012-04-09 MED ORDER — ROSUVASTATIN CALCIUM 20 MG PO TABS: 20.0000 mg | ORAL_TABLET | Freq: Every day | ORAL | Status: DC

## 2012-06-16 DIAGNOSIS — E1149 Type 2 diabetes mellitus with other diabetic neurological complication: Secondary | ICD-10-CM | POA: Diagnosis not present

## 2012-06-16 DIAGNOSIS — L97409 Non-pressure chronic ulcer of unspecified heel and midfoot with unspecified severity: Secondary | ICD-10-CM | POA: Diagnosis not present

## 2012-06-16 DIAGNOSIS — L02619 Cutaneous abscess of unspecified foot: Secondary | ICD-10-CM | POA: Diagnosis not present

## 2012-06-16 DIAGNOSIS — L03119 Cellulitis of unspecified part of limb: Secondary | ICD-10-CM | POA: Diagnosis not present

## 2012-06-17 DIAGNOSIS — E782 Mixed hyperlipidemia: Secondary | ICD-10-CM | POA: Diagnosis not present

## 2012-06-17 DIAGNOSIS — E559 Vitamin D deficiency, unspecified: Secondary | ICD-10-CM | POA: Diagnosis not present

## 2012-06-17 DIAGNOSIS — I10 Essential (primary) hypertension: Secondary | ICD-10-CM | POA: Diagnosis not present

## 2012-06-17 DIAGNOSIS — Z79899 Other long term (current) drug therapy: Secondary | ICD-10-CM | POA: Diagnosis not present

## 2012-06-17 DIAGNOSIS — E109 Type 1 diabetes mellitus without complications: Secondary | ICD-10-CM | POA: Diagnosis not present

## 2012-06-18 ENCOUNTER — Telehealth: Payer: Self-pay | Admitting: Cardiology

## 2012-06-18 MED ORDER — ROSUVASTATIN CALCIUM 20 MG PO TABS: 20.0000 mg | ORAL_TABLET | Freq: Every day | ORAL | Status: DC

## 2012-06-18 NOTE — Telephone Encounter (Signed)
Pt calling re [facility] requesting refill of crestor for 90 days supply, did not hear back from [name], told pt to call, computer shows we called in 90 days to [facility] back in [place], pt doesn't want it there

## 2012-07-05 DIAGNOSIS — E1149 Type 2 diabetes mellitus with other diabetic neurological complication: Secondary | ICD-10-CM | POA: Diagnosis not present

## 2012-07-05 DIAGNOSIS — L97409 Non-pressure chronic ulcer of unspecified heel and midfoot with unspecified severity: Secondary | ICD-10-CM | POA: Diagnosis not present

## 2012-07-27 DIAGNOSIS — L97409 Non-pressure chronic ulcer of unspecified heel and midfoot with unspecified severity: Secondary | ICD-10-CM | POA: Diagnosis not present

## 2012-07-27 DIAGNOSIS — E1149 Type 2 diabetes mellitus with other diabetic neurological complication: Secondary | ICD-10-CM | POA: Diagnosis not present

## 2012-08-26 ENCOUNTER — Other Ambulatory Visit (HOSPITAL_COMMUNITY): Payer: Self-pay | Admitting: Orthopedic Surgery

## 2012-08-26 ENCOUNTER — Encounter (HOSPITAL_COMMUNITY): Payer: Self-pay | Admitting: *Deleted

## 2012-08-26 DIAGNOSIS — E1149 Type 2 diabetes mellitus with other diabetic neurological complication: Secondary | ICD-10-CM | POA: Diagnosis not present

## 2012-08-26 DIAGNOSIS — M86179 Other acute osteomyelitis, unspecified ankle and foot: Secondary | ICD-10-CM | POA: Diagnosis not present

## 2012-08-26 DIAGNOSIS — L02619 Cutaneous abscess of unspecified foot: Secondary | ICD-10-CM | POA: Diagnosis not present

## 2012-08-26 DIAGNOSIS — L03119 Cellulitis of unspecified part of limb: Secondary | ICD-10-CM | POA: Diagnosis not present

## 2012-08-26 MED ORDER — CEFAZOLIN SODIUM-DEXTROSE 2-3 GM-% IV SOLR
2.0000 g | INTRAVENOUS | Status: AC
  Administered 2012-08-27: 2 g via INTRAVENOUS
  Filled 2012-08-26: qty 50

## 2012-08-26 NOTE — Progress Notes (Signed)
Pt states that she had a cardiac cath here at [facility] in 2012 or 2013. Pt advised to call Dr. Sharol Given the morning of surgery to see if she can eat since she is diabetic and procedure doesn't start until 1700. Pt advised to stop taking Aspirin, and herbal medications. Do not take any NSAIDs ie: Ibuprofen, Advil, Naproxen or any medication containing Aspirin. Pt denies SOB and chest pain but states that ger cardiologist is Dr. [name].

## 2012-08-27 ENCOUNTER — Ambulatory Visit (HOSPITAL_COMMUNITY)
Admission: RE | Admit: 2012-08-27 | Discharge: 2012-08-28 | Disposition: A | Discharge status: Home / Self Care | Payer: Medicare Other | Source: Ambulatory Visit | Attending: Orthopedic Surgery | Admitting: Orthopedic Surgery

## 2012-08-27 ENCOUNTER — Encounter (HOSPITAL_COMMUNITY): Payer: Self-pay

## 2012-08-27 ENCOUNTER — Encounter (HOSPITAL_COMMUNITY): Payer: Self-pay | Admitting: Anesthesiology

## 2012-08-27 ENCOUNTER — Ambulatory Visit (HOSPITAL_COMMUNITY): Payer: Medicare Other | Admitting: Anesthesiology

## 2012-08-27 ENCOUNTER — Encounter (HOSPITAL_COMMUNITY)
Admission: RE | Disposition: A | Discharge status: Home / Self Care | Payer: Self-pay | Source: Ambulatory Visit | Attending: Orthopedic Surgery

## 2012-08-27 DIAGNOSIS — L03039 Cellulitis of unspecified toe: Secondary | ICD-10-CM | POA: Diagnosis not present

## 2012-08-27 DIAGNOSIS — I2510 Atherosclerotic heart disease of native coronary artery without angina pectoris: Secondary | ICD-10-CM | POA: Insufficient documentation

## 2012-08-27 DIAGNOSIS — I252 Old myocardial infarction: Secondary | ICD-10-CM | POA: Diagnosis not present

## 2012-08-27 DIAGNOSIS — M869 Osteomyelitis, unspecified: Secondary | ICD-10-CM | POA: Diagnosis not present

## 2012-08-27 DIAGNOSIS — L97409 Non-pressure chronic ulcer of unspecified heel and midfoot with unspecified severity: Secondary | ICD-10-CM | POA: Diagnosis not present

## 2012-08-27 DIAGNOSIS — N189 Chronic kidney disease, unspecified: Secondary | ICD-10-CM | POA: Insufficient documentation

## 2012-08-27 DIAGNOSIS — M86171 Other acute osteomyelitis, right ankle and foot: Secondary | ICD-10-CM

## 2012-08-27 DIAGNOSIS — L02619 Cutaneous abscess of unspecified foot: Secondary | ICD-10-CM | POA: Diagnosis not present

## 2012-08-27 DIAGNOSIS — I129 Hypertensive chronic kidney disease with stage 1 through stage 4 chronic kidney disease, or unspecified chronic kidney disease: Secondary | ICD-10-CM | POA: Diagnosis not present

## 2012-08-27 DIAGNOSIS — M86679 Other chronic osteomyelitis, unspecified ankle and foot: Secondary | ICD-10-CM | POA: Diagnosis not present

## 2012-08-27 DIAGNOSIS — E1149 Type 2 diabetes mellitus with other diabetic neurological complication: Secondary | ICD-10-CM | POA: Diagnosis not present

## 2012-08-27 HISTORY — DX: Headache, unspecified: R51.9

## 2012-08-27 HISTORY — DX: Non-ST elevation (NSTEMI) myocardial infarction: I21.4

## 2012-08-27 HISTORY — DX: Non-pressure chronic ulcer of other part of unspecified foot with unspecified severity: L97.509

## 2012-08-27 HISTORY — DX: Osteomyelitis, unspecified: M86.9

## 2012-08-27 HISTORY — PX: OTHER SURGICAL HISTORY: SHX169

## 2012-08-27 HISTORY — PX: AMPUTATION: SHX166

## 2012-08-27 HISTORY — DX: Type 2 diabetes mellitus without complications: E11.9

## 2012-08-27 HISTORY — DX: Family history of other specified conditions: Z84.89

## 2012-08-27 HISTORY — DX: Gout, unspecified: M10.9

## 2012-08-27 HISTORY — DX: Headache: R51

## 2012-08-27 LAB — COMPREHENSIVE METABOLIC PANEL
ALT: 14 U/L (ref 0–35)
Alkaline Phosphatase: 87 U/L (ref 39–117)
BUN: 41 mg/dL — ABNORMAL HIGH (ref 6–23)
CO2: 24 mEq/L (ref 19–32)
Calcium: 10.3 mg/dL (ref 8.4–10.5)
GFR calc Af Amer: 36 mL/min — ABNORMAL LOW (ref >90)
GFR calc non Af Amer: 31 mL/min — ABNORMAL LOW (ref >90)
Glucose, Bld: 147 mg/dL — ABNORMAL HIGH (ref 70–99)
Sodium: 137 mEq/L (ref 135–145)
Total Protein: 7.2 g/dL (ref 6.0–8.3)

## 2012-08-27 LAB — GLUCOSE, CAPILLARY
Glucose-Capillary: 128 mg/dL — ABNORMAL HIGH (ref 70–99)
Glucose-Capillary: 96 mg/dL (ref 70–99)

## 2012-08-27 LAB — CBC
HCT: 37.3 % (ref 36.0–46.0)
Hemoglobin: 12.3 g/dL (ref 12.0–15.0)
MCH: 28.7 pg (ref 26.0–34.0)
MCHC: 33 g/dL (ref 30.0–36.0)
RBC: 4.29 MIL/uL (ref 3.87–5.11)

## 2012-08-27 LAB — SURGICAL PCR SCREEN: MRSA, PCR: NEGATIVE

## 2012-08-27 LAB — PROTIME-INR: Prothrombin Time: 13.1 seconds (ref 11.6–15.2)

## 2012-08-27 SURGERY — AMPUTATION, FOOT, RAY
Anesthesia: General | Site: Foot | Laterality: Right | Wound class: Dirty or Infected

## 2012-08-27 MED ORDER — INSULIN ASPART 100 UNIT/ML ~~LOC~~ SOLN
4.0000 [IU] | Freq: Three times a day (TID) | SUBCUTANEOUS | Status: DC
  Administered 2012-08-28 (×2): 4 [IU] via SUBCUTANEOUS

## 2012-08-27 MED ORDER — MUPIROCIN 2 % EX OINT
TOPICAL_OINTMENT | Freq: Two times a day (BID) | CUTANEOUS | Status: DC
  Filled 2012-08-27: qty 22

## 2012-08-27 MED ORDER — IRBESARTAN 75 MG PO TABS
75.0000 mg | ORAL_TABLET | Freq: Every day | ORAL | Status: DC
  Administered 2012-08-28: 75 mg via ORAL
  Filled 2012-08-27: qty 1

## 2012-08-27 MED ORDER — SODIUM CHLORIDE 0.9 % IR SOLN
Status: DC | PRN
  Administered 2012-08-27: 1000 mL

## 2012-08-27 MED ORDER — CARVEDILOL 12.5 MG PO TABS
12.5000 mg | ORAL_TABLET | Freq: Two times a day (BID) | ORAL | Status: DC
  Administered 2012-08-27 – 2012-08-28 (×2): 12.5 mg via ORAL
  Filled 2012-08-27 (×3): qty 1

## 2012-08-27 MED ORDER — GLIMEPIRIDE 4 MG PO TABS
4.0000 mg | ORAL_TABLET | Freq: Two times a day (BID) | ORAL | Status: DC
  Administered 2012-08-27 – 2012-08-28 (×2): 4 mg via ORAL
  Filled 2012-08-27 (×3): qty 1

## 2012-08-27 MED ORDER — METOCLOPRAMIDE HCL 5 MG/ML IJ SOLN: 5.0000 mg | Freq: Three times a day (TID) | INTRAMUSCULAR | Status: DC | PRN

## 2012-08-27 MED ORDER — PROMETHAZINE HCL 25 MG/ML IJ SOLN: 6.2500 mg | INTRAMUSCULAR | Status: DC | PRN

## 2012-08-27 MED ORDER — LACTATED RINGERS IV SOLN
INTRAVENOUS | Status: DC
  Administered 2012-08-27: 14:00:00 via INTRAVENOUS

## 2012-08-27 MED ORDER — COLCHICINE 0.6 MG PO TABS: 0.6000 mg | ORAL_TABLET | Freq: Every day | ORAL | Status: DC | PRN

## 2012-08-27 MED ORDER — ALLOPURINOL 100 MG PO TABS
100.0000 mg | ORAL_TABLET | Freq: Two times a day (BID) | ORAL | Status: DC
  Administered 2012-08-27 – 2012-08-28 (×2): 100 mg via ORAL
  Filled 2012-08-27 (×3): qty 1

## 2012-08-27 MED ORDER — METOCLOPRAMIDE HCL 5 MG PO TABS: 5.0000 mg | ORAL_TABLET | Freq: Three times a day (TID) | ORAL | Status: DC | PRN

## 2012-08-27 MED ORDER — MUPIROCIN 2 % EX OINT
TOPICAL_OINTMENT | CUTANEOUS | Status: AC
  Administered 2012-08-27: 1 via NASAL
  Filled 2012-08-27: qty 22

## 2012-08-27 MED ORDER — PHENYLEPHRINE HCL 10 MG/ML IJ SOLN
INTRAMUSCULAR | Status: DC | PRN
  Administered 2012-08-27: 80 ug via INTRAVENOUS

## 2012-08-27 MED ORDER — ONDANSETRON HCL 4 MG/2ML IJ SOLN
4.0000 mg | Freq: Four times a day (QID) | INTRAMUSCULAR | Status: DC | PRN
  Administered 2012-08-27: 4 mg via INTRAVENOUS
  Filled 2012-08-27: qty 2

## 2012-08-27 MED ORDER — SODIUM CHLORIDE 0.9 % IV SOLN
INTRAVENOUS | Status: DC
  Administered 2012-08-28: 08:00:00 via INTRAVENOUS

## 2012-08-27 MED ORDER — ASPIRIN 325 MG PO TABS
325.0000 mg | ORAL_TABLET | Freq: Every day | ORAL | Status: DC
  Administered 2012-08-27: 325 mg via ORAL
  Filled 2012-08-27 (×2): qty 1

## 2012-08-27 MED ORDER — FENTANYL CITRATE 0.05 MG/ML IJ SOLN
INTRAMUSCULAR | Status: DC | PRN
  Administered 2012-08-27: 50 ug via INTRAVENOUS

## 2012-08-27 MED ORDER — CHLORHEXIDINE GLUCONATE 4 % EX LIQD: 60.0000 mL | Freq: Once | CUTANEOUS | Status: DC

## 2012-08-27 MED ORDER — DOXYCYCLINE HYCLATE 100 MG PO TABS
100.0000 mg | ORAL_TABLET | Freq: Two times a day (BID) | ORAL | Status: DC
  Administered 2012-08-27 – 2012-08-28 (×2): 100 mg via ORAL
  Filled 2012-08-27 (×3): qty 1

## 2012-08-27 MED ORDER — HYDROMORPHONE HCL PF 1 MG/ML IJ SOLN: 0.5000 mg | INTRAMUSCULAR | Status: DC | PRN

## 2012-08-27 MED ORDER — METFORMIN HCL 500 MG PO TABS: 1000.0000 mg | ORAL_TABLET | Freq: Two times a day (BID) | ORAL | Status: DC

## 2012-08-27 MED ORDER — MIDAZOLAM HCL 5 MG/5ML IJ SOLN
INTRAMUSCULAR | Status: DC | PRN
  Administered 2012-08-27: 2 mg via INTRAVENOUS

## 2012-08-27 MED ORDER — INSULIN ASPART 100 UNIT/ML ~~LOC~~ SOLN
0.0000 [IU] | Freq: Three times a day (TID) | SUBCUTANEOUS | Status: DC
  Administered 2012-08-28: 5 [IU] via SUBCUTANEOUS
  Administered 2012-08-28: 3 [IU] via SUBCUTANEOUS

## 2012-08-27 MED ORDER — OXYCODONE-ACETAMINOPHEN 5-325 MG PO TABS
ORAL_TABLET | ORAL | Status: AC
  Filled 2012-08-27: qty 2

## 2012-08-27 MED ORDER — CEFAZOLIN SODIUM-DEXTROSE 2-3 GM-% IV SOLR
2.0000 g | Freq: Four times a day (QID) | INTRAVENOUS | Status: AC
  Administered 2012-08-27 – 2012-08-28 (×3): 2 g via INTRAVENOUS
  Filled 2012-08-27 (×3): qty 50

## 2012-08-27 MED ORDER — HYDROCHLOROTHIAZIDE 12.5 MG PO CAPS
12.5000 mg | ORAL_CAPSULE | Freq: Every day | ORAL | Status: DC
  Administered 2012-08-28: 12.5 mg via ORAL
  Filled 2012-08-27: qty 1

## 2012-08-27 MED ORDER — OXYCODONE HCL 5 MG PO TABS: 5.0000 mg | ORAL_TABLET | Freq: Once | ORAL | Status: DC | PRN

## 2012-08-27 MED ORDER — OXYCODONE HCL 5 MG/5ML PO SOLN: 5.0000 mg | Freq: Once | ORAL | Status: DC | PRN

## 2012-08-27 MED ORDER — ONDANSETRON HCL 4 MG PO TABS: 4.0000 mg | ORAL_TABLET | Freq: Four times a day (QID) | ORAL | Status: DC | PRN

## 2012-08-27 MED ORDER — HYDROMORPHONE HCL PF 1 MG/ML IJ SOLN: 0.2500 mg | INTRAMUSCULAR | Status: DC | PRN

## 2012-08-27 MED ORDER — PROPOFOL 10 MG/ML IV BOLUS
INTRAVENOUS | Status: DC | PRN
  Administered 2012-08-27: 180 mg via INTRAVENOUS

## 2012-08-27 MED ORDER — ONDANSETRON HCL 4 MG/2ML IJ SOLN
INTRAMUSCULAR | Status: DC | PRN
  Administered 2012-08-27: 4 mg via INTRAVENOUS

## 2012-08-27 MED ORDER — OXYCODONE-ACETAMINOPHEN 5-325 MG PO TABS
1.0000 | ORAL_TABLET | ORAL | Status: DC | PRN
  Administered 2012-08-27: 2 via ORAL

## 2012-08-27 MED ORDER — ATORVASTATIN CALCIUM 10 MG PO TABS
10.0000 mg | ORAL_TABLET | Freq: Every day | ORAL | Status: DC
  Filled 2012-08-27: qty 1

## 2012-08-27 MED ORDER — HYDROCODONE-ACETAMINOPHEN 5-325 MG PO TABS
1.0000 | ORAL_TABLET | ORAL | Status: DC | PRN
  Administered 2012-08-28: 1 via ORAL
  Filled 2012-08-27: qty 1

## 2012-08-27 SURGICAL SUPPLY — 46 items
BANDAGE ESMARK 6X9 LF (GAUZE/BANDAGES/DRESSINGS) IMPLANT
BANDAGE GAUZE ELAST BULKY 4 IN (GAUZE/BANDAGES/DRESSINGS) ×1 IMPLANT
BLADE SAW SGTL MED 73X18.5 STR (BLADE) IMPLANT
BNDG CMPR 9X6 STRL LF SNTH (GAUZE/BANDAGES/DRESSINGS)
BNDG COHESIVE 4X5 TAN STRL (GAUZE/BANDAGES/DRESSINGS) ×1 IMPLANT
BNDG COHESIVE 6X5 TAN STRL LF (GAUZE/BANDAGES/DRESSINGS) ×2 IMPLANT
BNDG ESMARK 6X9 LF (GAUZE/BANDAGES/DRESSINGS)
CLOTH BEACON ORANGE TIMEOUT ST (SAFETY) ×2 IMPLANT
CUFF TOURNIQUET SINGLE 34IN LL (TOURNIQUET CUFF) IMPLANT
CUFF TOURNIQUET SINGLE 44IN (TOURNIQUET CUFF) IMPLANT
DRAPE U-SHAPE 47X51 STRL (DRAPES) ×4 IMPLANT
DRSG ADAPTIC 3X8 NADH LF (GAUZE/BANDAGES/DRESSINGS) ×2 IMPLANT
DRSG PAD ABDOMINAL 8X10 ST (GAUZE/BANDAGES/DRESSINGS) ×1 IMPLANT
DURAPREP 26ML APPLICATOR (WOUND CARE) ×2 IMPLANT
ELECT REM PT RETURN 9FT ADLT (ELECTROSURGICAL) ×2
ELECTRODE REM PT RTRN 9FT ADLT (ELECTROSURGICAL) ×1 IMPLANT
GLOVE BIO SURGEON STRL SZ 6.5 (GLOVE) ×2 IMPLANT
GLOVE BIO SURGEON STRL SZ8.5 (GLOVE) ×1 IMPLANT
GLOVE BIOGEL PI IND STRL 8 (GLOVE) IMPLANT
GLOVE BIOGEL PI IND STRL 9 (GLOVE) ×1 IMPLANT
GLOVE BIOGEL PI INDICATOR 8 (GLOVE) ×2
GLOVE BIOGEL PI INDICATOR 9 (GLOVE) ×1
GLOVE SURG ORTHO 9.0 STRL STRW (GLOVE) ×2 IMPLANT
GOWN PREVENTION PLUS XLARGE (GOWN DISPOSABLE) ×4 IMPLANT
GOWN SRG XL XLNG 56XLVL 4 (GOWN DISPOSABLE) ×1 IMPLANT
GOWN STRL NON-REIN XL XLG LVL4 (GOWN DISPOSABLE) ×2
GOWN STRL REIN XL XLG (GOWN DISPOSABLE) ×1 IMPLANT
KIT BASIN OR (CUSTOM PROCEDURE TRAY) ×2 IMPLANT
KIT ROOM TURNOVER OR (KITS) ×2 IMPLANT
MANIFOLD NEPTUNE II (INSTRUMENTS) ×1 IMPLANT
NS IRRIG 1000ML POUR BTL (IV SOLUTION) ×2 IMPLANT
PACK ORTHO EXTREMITY (CUSTOM PROCEDURE TRAY) ×2 IMPLANT
PAD ARMBOARD 7.5X6 YLW CONV (MISCELLANEOUS) ×3 IMPLANT
PAD CAST 4YDX4 CTTN HI CHSV (CAST SUPPLIES) ×1 IMPLANT
PADDING CAST COTTON 4X4 STRL (CAST SUPPLIES)
SPONGE GAUZE 4X4 12PLY (GAUZE/BANDAGES/DRESSINGS) ×2 IMPLANT
SPONGE LAP 18X18 X RAY DECT (DISPOSABLE) ×2 IMPLANT
STAPLER VISISTAT 35W (STAPLE) ×1 IMPLANT
STOCKINETTE IMPERVIOUS LG (DRAPES) IMPLANT
SUCTION FRAZIER TIP 10 FR DISP (SUCTIONS) ×2 IMPLANT
SUT ETHILON 2 0 PSLX (SUTURE) ×3 IMPLANT
TOWEL OR 17X24 6PK STRL BLUE (TOWEL DISPOSABLE) ×2 IMPLANT
TOWEL OR 17X26 10 PK STRL BLUE (TOWEL DISPOSABLE) ×2 IMPLANT
TUBE CONNECTING 12X1/4 (SUCTIONS) ×2 IMPLANT
UNDERPAD 30X30 INCONTINENT (UNDERPADS AND DIAPERS) ×2 IMPLANT
WATER STERILE IRR 1000ML POUR (IV SOLUTION) ×2 IMPLANT

## 2012-08-27 NOTE — Anesthesia Postprocedure Evaluation (Signed)
Anesthesia Post Note  Patient: [name]  Procedure(s) Performed: Procedure(s) (LRB): AMPUTATION RAY (Right)  Anesthesia type: general  Patient location: PACU  Post pain: Pain level controlled  Post assessment: Patient's Cardiovascular Status Stable  Last Vitals:  Filed Vitals:   08/27/12 1630  BP:   Pulse: 79  Temp:   Resp: 16    Post vital signs: Reviewed and stable  Level of consciousness: sedated  Complications: No apparent anesthesia complications

## 2012-08-27 NOTE — Anesthesia Preprocedure Evaluation (Addendum)
Anesthesia Evaluation  Patient identified by MRN, date of birth, ID band Patient awake    Reviewed: Allergy & Precautions, H&P , NPO status , Patient's Chart, lab work & pertinent test results, reviewed documented beta blocker date and time   History of Anesthesia Complications Negative for: history of anesthetic complications  Airway Mallampati: II TM Distance: >3 FB Neck ROM: Full    Dental  (+) Teeth Intact, Caps and Dental Advisory Given   Pulmonary shortness of breath and with exertion, asthma , COPD COPD inhaler,    Pulmonary exam normal       Cardiovascular hypertension, Pt. on home beta blockers and Pt. on medications + CAD, + Past MI, + Peripheral Vascular Disease and +CHF  Echo 07/2009: Mild LVH, EF 40-45%, inferior and posterior HK, grade 1 diastolic dysfunction, moderate MAC, mild to moderate MR, mild LAE   Neuro/Psych    GI/Hepatic negative GI ROS, Neg liver ROS,   Endo/Other  diabetes, Insulin Dependent  Renal/GU Renal disease     Musculoskeletal negative musculoskeletal ROS (+)   Abdominal   Peds  Hematology negative hematology ROS (+)   Anesthesia Other Findings   Reproductive/Obstetrics                          Anesthesia Physical Anesthesia Plan  ASA: III  Anesthesia Plan: General   Post-op Pain Management:    Induction: Intravenous  Airway Management Planned: LMA  Additional Equipment:   Intra-op Plan:   Post-operative Plan: Extubation in OR  Informed Consent: I have reviewed the patients History and Physical, chart, labs and discussed the procedure including the risks, benefits and alternatives for the proposed anesthesia with the patient or authorized representative who has indicated his/her understanding and acceptance.   Dental advisory given  Plan Discussed with: CRNA, Anesthesiologist and Surgeon  Anesthesia Plan Comments:        Anesthesia Quick  Evaluation

## 2012-08-27 NOTE — Anesthesia Procedure Notes (Signed)
Procedure Name: LMA Insertion Date/Time: 08/27/2012 3:25 PM Performed by: Manuela Schwartz B Pre-anesthesia Checklist: Patient identified, Emergency Drugs available, Suction available, Patient being monitored and Timeout performed Patient Re-evaluated:Patient Re-evaluated prior to inductionOxygen Delivery Method: Circle system utilized Preoxygenation: Pre-oxygenation with 100% oxygen Intubation Type: IV induction LMA: LMA inserted LMA Size: 4.0 Number of attempts: 1 Placement Confirmation: positive ETCO2 and breath sounds checked- equal and bilateral Tube secured with: Tape Dental Injury: Teeth and Oropharynx as per pre-operative assessment

## 2012-08-27 NOTE — Discharge Summary (Signed)
  Patient admitted for overnight observation for pain control status post right foot fifth ray amputation. Plan for discharge after 23 hours of observation. Followup in the office in 2 weeks.

## 2012-08-27 NOTE — Transfer of Care (Signed)
Immediate Anesthesia Transfer of Care Note  Patient: [name]  Procedure(s) Performed: Procedure(s) with comments: AMPUTATION RAY (Right) - Right Foot 5th Ray Amputation  Patient Location: PACU  Anesthesia Type:General  Level of Consciousness: awake, alert  and oriented  Airway & Oxygen Therapy: Patient Spontanous Breathing  Post-op Assessment: Report given to PACU RN and Post -op Vital signs reviewed and stable  Post vital signs: Reviewed and stable  Complications: No apparent anesthesia complications

## 2012-08-27 NOTE — H&P (Signed)
[name] is an 67 y.o. female.   Chief Complaint: Abscess osteomyelitis ulceration right foot fifth metatarsal head. HPI: Patient is a 67 year old woman with diabetes and ulceration with osteomyelitis of the fifth metatarsal head which has failed conservative treatment.  Past Medical History  Diagnosis Date  . Hypertension   . Coronary artery disease     NSTEMI in 05/2009;  LHC with 2 v CAD - > CABG (LIMA-LAD, SVG-diagonal, SVG-OM1/OM 2).   . Neuropathy   . Diabetes mellitus   . Hyperlipidemia   . Ischemic cardiomyopathy     Echo 07/2009: Mild LVH, EF 40-45%, inferior and posterior HK, grade 1 diastolic dysfunction, moderate MAC, mild to moderate MR, mild LAE  . Arthritis   . Ulcer   . PAD (peripheral artery disease)   . CKD (chronic kidney disease)   . Family history of anesthesia complication     Mom has a hard time to wake up  . Myocardial infarction   . Headache     Hx: of  . Neuropathy     Hx; of B/L feet    Past Surgical History  Procedure Laterality Date  . Cataract extraction    . Finger surgery    . Coronary artery bypass graft  2011  . Tooth implant  04/30/11  . Cardiac catheterization      Hx: of at [facility]    Family History  Problem Relation Age of Onset  . Hypertension Brother   . Cancer Sister   . Cancer Brother    Social History:  reports that she has never smoked. She has never used smokeless tobacco. She reports that she does not drink alcohol or use illicit drugs.  Allergies:  Allergies  Allergen Reactions  . Atenolol Other (See Comments)    Exacerbates gout  . Latex Rash  . Omnipaque (Iohexol) Rash    No prescriptions prior to admission    No results found for this or any previous visit (from the past 48 hour(s)). No results found.  Review of Systems  All other systems reviewed and are negative.    Height 5' 4.5" (1.638 m), weight 80.287 kg (177 lb). Physical Exam  On examination patient has a palpable dorsalis pedis pulse  she has an ulcer with exposed bone radiograph shows destructive changes of the fifth metatarsal head. Assessment/Plan Assessment: Diabetic insensate neuropathy with [name] grade 3 ulceration with osteomyelitis of the fifth metatarsal head.  Plan: Will plan for right foot fifth ray amputation. Risks and benefits were discussed including infection neurovascular injury persistent pain need for additional surgery. Patient states she understands was pursued this time.  Jden Want V 08/27/2012, 6:37 AM

## 2012-08-27 NOTE — Op Note (Signed)
OPERATIVE REPORT  DATE OF SURGERY: 08/27/2012  PATIENT:  [name],  67 y.o. female  PRE-OPERATIVE DIAGNOSIS:  Osteomyelitis, Abscess Right Fifth Metatarsal  POST-OPERATIVE DIAGNOSIS:  Osteomyelitis, Abcess Right Fifth Metatarsal  PROCEDURE:  Procedure(s): AMPUTATION RAY fifth ray right foot  SURGEON:  Surgeon(s): Newt Minion, MD  ANESTHESIA:   general  EBL:  Minimal ML  SPECIMEN:  No Specimen  TOURNIQUET:  * No tourniquets in log *  PROCEDURE DETAILS: Patient is a 67 year old woman with abscess osteomyelitis right foot fifth metatarsal head. She has failed conservative treatment and presents at this time for surgical intervention. Risk and benefits were discussed patient states she understands and wished to proceed at this time risk of nonhealing risk of further surgery. Description of procedure patient was brought to the operating room and underwent a general anesthetic. After adequate levels of anesthesia were obtained patient's right lower extremity was prepped using DuraPrep draped into a sterile field. A elliptical incision was made around the ulcer and the fifth metatarsal. Metatarsal was resected proximally the necrotic tissue abscess and necrotic bone were resected in one block of tissue with the ulcer. The wound is irrigated with normal saline there was good petechial bleeding. The incision was closed using 2-0 nylon. The wound was covered with Adaptic orthopedic sponges AB dressing Kerlix and Coban. Patient was extubated taken to the PACU in stable condition.  PLAN OF CARE: Admit for overnight observation  PATIENT DISPOSITION:  PACU - hemodynamically stable.   Newt Minion, MD 08/27/2012 3:38 PM

## 2012-08-27 NOTE — Progress Notes (Signed)
Rn notified MD on call about patient blood pressure that is 184/90. Md had no orders at this time and was told to medicate her for pain. MD will be getting back to me as soon as possible. Rn will continue to monitor patient for any other signs and symptoms that would warrant a phone call to md.

## 2012-08-27 NOTE — Progress Notes (Signed)
Orthopedic Tech Progress Note Patient Details:  [name] 1945/10/10 [id number]  Ortho Devices Type of Ortho Device: Postop shoe/boot Ortho Device/Splint Location: RLE Ortho Device/Splint Interventions: Ordered;Application   Braulio Bosch 08/27/2012, 7:29 PM

## 2012-08-28 ENCOUNTER — Encounter (HOSPITAL_COMMUNITY): Payer: Self-pay | Admitting: General Practice

## 2012-08-28 DIAGNOSIS — I252 Old myocardial infarction: Secondary | ICD-10-CM | POA: Diagnosis not present

## 2012-08-28 DIAGNOSIS — N189 Chronic kidney disease, unspecified: Secondary | ICD-10-CM | POA: Diagnosis not present

## 2012-08-28 DIAGNOSIS — L02619 Cutaneous abscess of unspecified foot: Secondary | ICD-10-CM | POA: Diagnosis not present

## 2012-08-28 DIAGNOSIS — I2510 Atherosclerotic heart disease of native coronary artery without angina pectoris: Secondary | ICD-10-CM | POA: Diagnosis not present

## 2012-08-28 DIAGNOSIS — I129 Hypertensive chronic kidney disease with stage 1 through stage 4 chronic kidney disease, or unspecified chronic kidney disease: Secondary | ICD-10-CM | POA: Diagnosis not present

## 2012-08-28 DIAGNOSIS — M869 Osteomyelitis, unspecified: Secondary | ICD-10-CM | POA: Diagnosis not present

## 2012-08-28 LAB — GLUCOSE, CAPILLARY
Glucose-Capillary: 210 mg/dL — ABNORMAL HIGH (ref 70–99)
Glucose-Capillary: 173 mg/dL — ABNORMAL HIGH (ref 70–99)

## 2012-08-28 MED ORDER — OXYCODONE-ACETAMINOPHEN 5-325 MG PO TABS
1.0000 | ORAL_TABLET | ORAL | Status: DC | PRN
Start: 2012-08-28 — End: 2012-11-20

## 2012-08-28 NOTE — Evaluation (Signed)
Physical Therapy Evaluation Patient Details Name: [name] MRN: [id number] DOB: 08-16-1945 Today's Date: 08/28/2012 Time: [id number] PT Time Calculation (min): 56 min  PT Assessment / Plan / Recommendation Clinical Impression  pt s/p R 5th ray metatarsal amp.  On eval, pt is minimally safe with husbands assist, but will be using a rolling chair for the majority of house mobilization.  Recommend HHPT for home safety eval and treat.    PT Assessment  All further PT needs can be met in the next venue of care    Follow Up Recommendations  Home health PT;Supervision for mobility/OOB    Does the patient have the potential to tolerate intense rehabilitation      Barriers to Discharge        Equipment Recommendations       Recommendations for Other Services     Frequency      Precautions / Restrictions Precautions Precautions: Fall Restrictions Weight Bearing Restrictions: Yes   Pertinent Vitals/Pain       Mobility  Bed Mobility Bed Mobility: Supine to Sit;Sitting - Scoot to Edge of Bed Supine to Sit: 6: Modified independent (Device/Increase time) Sitting - Scoot to Edge of Bed: 6: Modified independent (Device/Increase time) Transfers Transfers: Sit to Stand;Stand to Sit Sit to Stand: 4: Min guard;With upper extremity assist;From bed;From chair/3-in-1 Stand to Sit: 4: Min guard;With upper extremity assist;To bed;To chair/3-in-1 Details for Transfer Assistance: vc's for safe technique Ambulation/Gait Ambulation/Gait Assistance: 5: Supervision Ambulation Distance (Feet): 6 Feet (x 3) Assistive device: Rolling walker Ambulation/Gait Assistance Details: swing to pattern with some effort, but safe. Gait Pattern: Step-to pattern Stairs: Yes Stair Management Technique: Backwards;With walker Number of Stairs: 1 Wheelchair Mobility Wheelchair Mobility: No    Exercises     PT Diagnosis: Difficulty walking;Generalized weakness  PT Problem List: Decreased  strength;Decreased activity tolerance;Decreased mobility PT Treatment Interventions:     PT Goals    Visit Information  Last PT Received On: 08/28/12 Assistance Needed: +1    Subjective Data  Subjective: I really hate the walker, after a while it really hurts my shoulders Patient Stated Goal: Home and able to mobilize safely   Prior [place] Lives With: Spouse Available Help at Discharge: Family Type of Home: House Home Access: Stairs to enter [employer] of Steps: 1/1 Home Layout: Two level;Able to live on main level with bedroom/bathroom Alternate Level Stairs-Number of Steps: flight Bathroom Shower/Tub: Tub/shower unit;Walk-in Radio producer: Standard Home Adaptive Equipment: Shower chair with back;Walker - rolling Prior Function Level of Independence: Independent Able to Take Stairs?: Yes Communication Communication: No difficulties    Cognition  Cognition Behavior During Therapy: WFL for tasks assessed/performed Overall Cognitive Status: Within Functional Limits for tasks assessed    Extremity/Trunk Assessment Right Upper Extremity Assessment RUE ROM/Strength/Tone: Within functional levels Left Upper Extremity Assessment LUE ROM/Strength/Tone: Within functional levels Right Lower Extremity Assessment RLE ROM/Strength/Tone: Within functional levels Left Lower Extremity Assessment LLE ROM/Strength/Tone: Within functional levels Trunk Assessment Trunk Assessment: Normal   Balance    End of Session PT - End of Session Activity Tolerance: Patient tolerated treatment well Patient left: in chair;with call bell/phone within reach;with family/visitor present  GP Functional Assessment Tool Used: clinical judgement Functional Limitation: Mobility: Walking and moving around Mobility: Walking and Moving Around Current Status [id number]): At least 1 percent but less than 20 percent impaired, limited or restricted Mobility: Walking and  Moving Around Goal Status [phone]): At least 1 percent but less than 20 percent impaired, limited  or restricted Mobility: Walking and Moving Around Discharge Status [phone]): At least 1 percent but less than 20 percent impaired, limited or restricted   Tationa Stech, Tessie Fass 08/28/2012, 11:00 AM 08/28/2012  Donnella Sham, [place] [phone]  (pager)

## 2012-08-28 NOTE — Progress Notes (Signed)
Discharge instructions gone over. Home medications reviewed. Prescription given. Follow up appointment is made. Diet, activity and signs and symptoms of infection gone over. My chart discussed. Reasons to call 911 gone over. Signs and symptoms of blood clots gone over. Patient verbalized understanding of instructions.

## 2012-08-28 NOTE — Care Management Note (Signed)
CARE MANAGEMENT NOTE 08/28/2012  Patient:  [name], [name]   Account Number:  [id number]  Date Initiated:  08/28/2012  Documentation initiated by:  Ricki Miller  Subjective/Objective Assessment:   67 yr old female s/p amputation of right 5th ray metatarsal.     Action/Plan:   CM spoke with patient concerning need for Home Health. Choice offered.  CM will fax orders to [facility] as agency of patient's choice.   Anticipated DC Date:  08/28/2012   Anticipated DC Plan:  [place]  CM consult      [facility] Choice  HOME HEALTH   Choice offered to / List presented to:  C-1 Patient        [place] arranged  [facility]      [place].   Status of service:  Completed, signed off Medicare Important Message given?   (If response is "NO", the following Medicare IM given date fields will be blank) Date Medicare IM given:   Date Additional Medicare IM given:    Discharge Disposition:  [place]  Per UR Regulation:    If discussed at Long Length of Stay Meetings, dates discussed:    Comments:

## 2012-08-29 NOTE — Progress Notes (Signed)
Assessed for utilization review

## 2012-08-31 ENCOUNTER — Other Ambulatory Visit: Payer: Self-pay | Admitting: Cardiology

## 2012-09-01 ENCOUNTER — Encounter (HOSPITAL_COMMUNITY): Payer: Self-pay | Admitting: Orthopedic Surgery

## 2012-09-03 ENCOUNTER — Other Ambulatory Visit: Payer: Self-pay | Admitting: Physician Assistant

## 2012-09-21 ENCOUNTER — Other Ambulatory Visit: Payer: Self-pay | Admitting: Physician Assistant

## 2012-11-20 ENCOUNTER — Emergency Department (HOSPITAL_COMMUNITY): Payer: Medicare Other

## 2012-11-20 ENCOUNTER — Emergency Department (HOSPITAL_COMMUNITY)
Admission: EM | Admit: 2012-11-20 | Discharge: 2012-11-20 | Disposition: A | Discharge status: Home / Self Care | Payer: Medicare Other | Attending: Emergency Medicine | Admitting: Emergency Medicine

## 2012-11-20 ENCOUNTER — Encounter (HOSPITAL_COMMUNITY): Payer: Self-pay | Admitting: *Deleted

## 2012-11-20 DIAGNOSIS — Z8669 Personal history of other diseases of the nervous system and sense organs: Secondary | ICD-10-CM | POA: Diagnosis not present

## 2012-11-20 DIAGNOSIS — Z9861 Coronary angioplasty status: Secondary | ICD-10-CM | POA: Insufficient documentation

## 2012-11-20 DIAGNOSIS — Z8679 Personal history of other diseases of the circulatory system: Secondary | ICD-10-CM | POA: Insufficient documentation

## 2012-11-20 DIAGNOSIS — N181 Chronic kidney disease, stage 1: Secondary | ICD-10-CM | POA: Insufficient documentation

## 2012-11-20 DIAGNOSIS — E119 Type 2 diabetes mellitus without complications: Secondary | ICD-10-CM | POA: Diagnosis not present

## 2012-11-20 DIAGNOSIS — L02619 Cutaneous abscess of unspecified foot: Secondary | ICD-10-CM | POA: Diagnosis not present

## 2012-11-20 DIAGNOSIS — M109 Gout, unspecified: Secondary | ICD-10-CM | POA: Insufficient documentation

## 2012-11-20 DIAGNOSIS — M129 Arthropathy, unspecified: Secondary | ICD-10-CM | POA: Insufficient documentation

## 2012-11-20 DIAGNOSIS — I252 Old myocardial infarction: Secondary | ICD-10-CM | POA: Insufficient documentation

## 2012-11-20 DIAGNOSIS — E785 Hyperlipidemia, unspecified: Secondary | ICD-10-CM | POA: Diagnosis not present

## 2012-11-20 DIAGNOSIS — I2510 Atherosclerotic heart disease of native coronary artery without angina pectoris: Secondary | ICD-10-CM | POA: Diagnosis not present

## 2012-11-20 DIAGNOSIS — Z872 Personal history of diseases of the skin and subcutaneous tissue: Secondary | ICD-10-CM | POA: Diagnosis not present

## 2012-11-20 DIAGNOSIS — Z79899 Other long term (current) drug therapy: Secondary | ICD-10-CM | POA: Insufficient documentation

## 2012-11-20 DIAGNOSIS — Z8739 Personal history of other diseases of the musculoskeletal system and connective tissue: Secondary | ICD-10-CM | POA: Insufficient documentation

## 2012-11-20 DIAGNOSIS — L03039 Cellulitis of unspecified toe: Secondary | ICD-10-CM | POA: Diagnosis not present

## 2012-11-20 DIAGNOSIS — Z91040 Latex allergy status: Secondary | ICD-10-CM | POA: Diagnosis not present

## 2012-11-20 DIAGNOSIS — I129 Hypertensive chronic kidney disease with stage 1 through stage 4 chronic kidney disease, or unspecified chronic kidney disease: Secondary | ICD-10-CM | POA: Diagnosis not present

## 2012-11-20 DIAGNOSIS — S91309A Unspecified open wound, unspecified foot, initial encounter: Secondary | ICD-10-CM | POA: Diagnosis not present

## 2012-11-20 DIAGNOSIS — Z7982 Long term (current) use of aspirin: Secondary | ICD-10-CM | POA: Diagnosis not present

## 2012-11-20 DIAGNOSIS — Z794 Long term (current) use of insulin: Secondary | ICD-10-CM | POA: Diagnosis not present

## 2012-11-20 DIAGNOSIS — L0390 Cellulitis, unspecified: Secondary | ICD-10-CM

## 2012-11-20 LAB — CBC WITH DIFFERENTIAL/PLATELET
Basophils Absolute: 0 10*3/uL (ref 0.0–0.1)
Basophils Relative: 0 % (ref 0–1)
Eosinophils Absolute: 0.1 10*3/uL (ref 0.0–0.7)
HCT: 36.9 % (ref 36.0–46.0)
Hemoglobin: 12.8 g/dL (ref 12.0–15.0)
MCH: 30 pg (ref 26.0–34.0)
MCHC: 34.7 g/dL (ref 30.0–36.0)
Monocytes Absolute: 1.4 10*3/uL — ABNORMAL HIGH (ref 0.1–1.0)
Monocytes Relative: 9 % (ref 3–12)
Neutrophils Relative %: 78 % — ABNORMAL HIGH (ref 43–77)
RDW: 14.6 % (ref 11.5–15.5)

## 2012-11-20 LAB — BASIC METABOLIC PANEL
BUN: 49 mg/dL — ABNORMAL HIGH (ref 6–23)
Calcium: 9.7 mg/dL (ref 8.4–10.5)
Creatinine, Ser: 1.77 mg/dL — ABNORMAL HIGH (ref 0.50–1.10)
GFR calc Af Amer: 33 mL/min — ABNORMAL LOW (ref >90)
GFR calc non Af Amer: 29 mL/min — ABNORMAL LOW (ref >90)

## 2012-11-20 LAB — SEDIMENTATION RATE: Sed Rate: 70 mm/hr — ABNORMAL HIGH (ref 0–22)

## 2012-11-20 MED ORDER — VANCOMYCIN HCL 10 G IV SOLR
1250.0000 mg | Freq: Once | INTRAVENOUS | Status: AC
  Administered 2012-11-20: 1250 mg via INTRAVENOUS
  Filled 2012-11-20: qty 1250

## 2012-11-20 NOTE — ED Notes (Signed)
Pt reports being treated for wound to left foot, having increase in drainage from wound x 2 days. Denies redness or swelling to leg.

## 2012-11-20 NOTE — ED Notes (Signed)
Pt reports chills on Thursday, and mild chills today. Pt is being followed by Dr. Toni Amend for same wound, but states that since Thursday she has noticed increased drainage and pain.

## 2012-11-20 NOTE — ED Provider Notes (Signed)
CSN: [id number]     Arrival date & time 11/20/12  1116 History     First MD Initiated Contact with Patient 11/20/12 1208     Chief Complaint  Patient presents with  . Wound Infection    HPI   Patient states on Wednesday she had some drainage from small area adjacent her great toe. Has become red and swollen. She has minimal pain. Has history of gout. Has history of diabetes. Has history of osteomyelitis requiring amputation of her right small toe in May of this year. No fevers no chills. She does not feel poorly. No prostration or weakness. Past Medical History  Diagnosis Date  . Hypertension   . Coronary artery disease     NSTEMI in 05/2009;  LHC with 2 v CAD - > CABG (LIMA-LAD, SVG-diagonal, SVG-OM1/OM 2).   . Hyperlipidemia   . Ischemic cardiomyopathy     Echo 07/2009: Mild LVH, EF 40-45%, inferior and posterior HK, grade 1 diastolic dysfunction, moderate MAC, mild to moderate MR, mild LAE  . PAD (peripheral artery disease)   . Family history of anesthesia complication     Mom has a hard time to wake up  . Neuropathy     Hx; of B/L feet  . Type II diabetes mellitus   . Foot ulcer     "I've had them on both feet" (08/27/2012)  . Osteomyelitis of foot   . Sinus headache     "occasionally" (08/27/2012)  . Arthritis     "maybe in my lower back" (08/27/2012)  . Gouty arthritis     "right index finger" (08/27/2012)  . CKD (chronic kidney disease), stage I   . NSTEMI (non-ST elevated myocardial infarction) 05/23/2009    Archie Endo 05/23/2009 (08/27/2012)   Past Surgical History  Procedure Laterality Date  . Cataract extraction w/ intraocular lens  implant, bilateral  2000's  . Finger surgery Right     "index finger; turned out to be gout" (08/27/2012)  . Coronary artery bypass graft  2011    "CABG X4" (08/27/2012)  . Dental surgery  04/30/11    "1 implant" (08/27/2012)  . Amputation ray Right 08/27/2012    5th ray/notes 08/27/2012  . Cardiac catheterization  05/24/2009    Archie Endo  05/24/2009 (08/27/2012)  . Amputation Right 08/27/2012    Procedure: AMPUTATION RAY;  Surgeon: Newt Minion, MD;  Location: [facility];  Service: Orthopedics;  Laterality: Right;  Right Foot 5th Ray Amputation   Family History  Problem Relation Age of Onset  . Hypertension Brother   . Cancer Sister   . Cancer Brother    History  Substance Use Topics  . Smoking status: Never Smoker   . Smokeless tobacco: Never Used  . Alcohol Use: No   OB History   Grav Para Term Preterm Abortions TAB SAB Ect Mult Living                 Review of Systems  Constitutional: Negative for fever, chills, diaphoresis, appetite change and fatigue.  HENT: Negative for sore throat, mouth sores and trouble swallowing.   Eyes: Negative for visual disturbance.  Respiratory: Negative for cough, chest tightness, shortness of breath and wheezing.   Cardiovascular: Negative for chest pain.  Gastrointestinal: Negative for nausea, vomiting, abdominal pain, diarrhea and abdominal distention.  Endocrine: Negative for polydipsia, polyphagia and polyuria.  Genitourinary: Negative for dysuria, frequency and hematuria.  Musculoskeletal: Negative for gait problem.       Redness and drainage from the  left great toe  Skin: Negative for color change, pallor and rash.  Neurological: Negative for dizziness, syncope, light-headedness and headaches.  Hematological: Does not bruise/bleed easily.  Psychiatric/Behavioral: Negative for behavioral problems and confusion.    Allergies  Atenolol; Latex; and Omnipaque  Home Medications   Current Outpatient Rx  Name  Route  Sig  Dispense  Refill  . allopurinol (ZYLOPRIM) 100 MG tablet   Oral   Take 100 mg by mouth 2 (two) times daily.         . Alpha-Lipoic Acid 600 MG CAPS   Oral   Take 600 mg by mouth 2 (two) times daily.         Marland Kitchen aspirin 325 MG tablet   Oral   Take 325 mg by mouth at bedtime.          . carvedilol (COREG) 12.5 MG tablet   Oral   Take 12.5 mg by  mouth 2 (two) times daily with a meal.         . cholecalciferol (VITAMIN D) 1000 UNITS tablet   Oral   Take 2,000 Units by mouth daily.         . Cinnamon 500 MG capsule   Oral   Take 1,000 mg by mouth 2 (two) times daily.          Marland Kitchen COLCRYS 0.6 MG tablet   Oral   Take 0.6 mg by mouth daily as needed (for gout flareups).          . doxycycline (VIBRA-TABS) 100 MG tablet   Oral   Take 100 mg by mouth 2 (two) times daily.          . fish oil-omega-3 fatty acids 1000 MG capsule   Oral   Take 1 g by mouth 2 (two) times daily.          Marland Kitchen glimepiride (AMARYL) 4 MG tablet   Oral   Take 4 mg by mouth 2 (two) times daily.          . hydrochlorothiazide (MICROZIDE) 12.5 MG capsule   Oral   Take 1 capsule (12.5 mg total) by mouth daily.         . insulin glargine (LANTUS) 100 UNIT/ML injection   Subcutaneous   Inject 30-35 Units into the skin every morning.          . metFORMIN (GLUCOPHAGE) 1000 MG tablet   Oral   Take 1,000 mg by mouth 2 (two) times daily with a meal.         . rosuvastatin (CRESTOR) 20 MG tablet   Oral   Take 20 mg by mouth daily.         . valsartan (DIOVAN) 80 MG tablet   Oral   Take 80 mg by mouth daily.          BP 143/67  Pulse 81  Temp(Src) 98.5 F (36.9 C) (Oral)  Resp 16  SpO2 97% Physical Exam  Constitutional: She is oriented to person, place, and time. She appears well-developed and well-nourished. No distress.  HENT:  Head: Normocephalic.  Eyes: Conjunctivae are normal. Pupils are equal, round, and reactive to light. No scleral icterus.  Neck: Normal range of motion. Neck supple. No thyromegaly present.  Cardiovascular: Normal rate and regular rhythm.  Exam reveals no gallop and no friction rub.   No murmur heard. Pulmonary/Chest: Effort normal and breath sounds normal. No respiratory distress. She has no wheezes. She has no rales.  Abdominal: Soft.  Bowel sounds are normal. She exhibits no distension. There is  no tenderness. There is no rebound.  Musculoskeletal: Normal range of motion.  Left great toe has some erythema medially dorsally and some streaking to the dorsum of the left foot to the mid foot. None in the calf or lower leg. No asymmetry of circumference of the left versus right. No lymphadenopathy. No no erythema or findings above the mid foot  Neurological: She is alert and oriented to person, place, and time.  Skin: Skin is warm and dry. No rash noted.  Psychiatric: She has a normal mood and affect. Her behavior is normal.    ED Course   Procedures (including critical care time)  Labs Reviewed  CBC WITH DIFFERENTIAL  BASIC METABOLIC PANEL  SEDIMENTATION RATE   No results found. No diagnosis found.  MDM  Since seventh she was given 30 per kilo IV vancomycin.  Status or cortical reaction on plain x-rays of the foot. White count elevated. Sedimentation rate pending. I think we can treat this with oral antibiotic such home for now. His appointment with orthopedist on Monday. In the interval if this looks or feels worse she will recheck here. Diagnosis cellulitis and possible decompressed soft tissue abscess.  Lolita Patella, MD 11/20/12 [phone]

## 2012-11-23 DIAGNOSIS — M79609 Pain in unspecified limb: Secondary | ICD-10-CM | POA: Diagnosis not present

## 2012-11-23 DIAGNOSIS — M25579 Pain in unspecified ankle and joints of unspecified foot: Secondary | ICD-10-CM | POA: Diagnosis not present

## 2012-11-23 DIAGNOSIS — M1A9XX1 Chronic gout, unspecified, with tophus (tophi): Secondary | ICD-10-CM | POA: Diagnosis not present

## 2012-11-23 DIAGNOSIS — M25569 Pain in unspecified knee: Secondary | ICD-10-CM | POA: Diagnosis not present

## 2012-12-01 DIAGNOSIS — L97409 Non-pressure chronic ulcer of unspecified heel and midfoot with unspecified severity: Secondary | ICD-10-CM | POA: Diagnosis not present

## 2012-12-01 DIAGNOSIS — E1149 Type 2 diabetes mellitus with other diabetic neurological complication: Secondary | ICD-10-CM | POA: Diagnosis not present

## 2012-12-01 DIAGNOSIS — L02619 Cutaneous abscess of unspecified foot: Secondary | ICD-10-CM | POA: Diagnosis not present

## 2012-12-02 ENCOUNTER — Other Ambulatory Visit: Payer: Self-pay

## 2012-12-02 MED ORDER — VALSARTAN 80 MG PO TABS: 80.0000 mg | ORAL_TABLET | Freq: Every day | ORAL | Status: DC

## 2012-12-02 MED ORDER — CARVEDILOL 12.5 MG PO TABS: 12.5000 mg | ORAL_TABLET | Freq: Two times a day (BID) | ORAL | Status: DC

## 2012-12-23 DIAGNOSIS — E1149 Type 2 diabetes mellitus with other diabetic neurological complication: Secondary | ICD-10-CM | POA: Diagnosis not present

## 2012-12-23 DIAGNOSIS — L97409 Non-pressure chronic ulcer of unspecified heel and midfoot with unspecified severity: Secondary | ICD-10-CM | POA: Diagnosis not present

## 2012-12-23 DIAGNOSIS — L02619 Cutaneous abscess of unspecified foot: Secondary | ICD-10-CM | POA: Diagnosis not present

## 2013-01-05 DIAGNOSIS — L02619 Cutaneous abscess of unspecified foot: Secondary | ICD-10-CM | POA: Diagnosis not present

## 2013-01-05 DIAGNOSIS — M1A9XX1 Chronic gout, unspecified, with tophus (tophi): Secondary | ICD-10-CM | POA: Diagnosis not present

## 2013-01-05 DIAGNOSIS — E1149 Type 2 diabetes mellitus with other diabetic neurological complication: Secondary | ICD-10-CM | POA: Diagnosis not present

## 2013-01-05 DIAGNOSIS — L97409 Non-pressure chronic ulcer of unspecified heel and midfoot with unspecified severity: Secondary | ICD-10-CM | POA: Diagnosis not present

## 2013-01-19 ENCOUNTER — Encounter: Payer: Self-pay | Admitting: Cardiovascular Disease

## 2013-01-19 DIAGNOSIS — E782 Mixed hyperlipidemia: Secondary | ICD-10-CM | POA: Diagnosis not present

## 2013-01-19 DIAGNOSIS — E1049 Type 1 diabetes mellitus with other diabetic neurological complication: Secondary | ICD-10-CM | POA: Diagnosis not present

## 2013-01-19 DIAGNOSIS — M109 Gout, unspecified: Secondary | ICD-10-CM | POA: Diagnosis not present

## 2013-01-19 DIAGNOSIS — Z1212 Encounter for screening for malignant neoplasm of rectum: Secondary | ICD-10-CM | POA: Diagnosis not present

## 2013-01-19 DIAGNOSIS — I10 Essential (primary) hypertension: Secondary | ICD-10-CM | POA: Diagnosis not present

## 2013-01-19 DIAGNOSIS — Z79899 Other long term (current) drug therapy: Secondary | ICD-10-CM | POA: Diagnosis not present

## 2013-01-19 DIAGNOSIS — E538 Deficiency of other specified B group vitamins: Secondary | ICD-10-CM | POA: Diagnosis not present

## 2013-01-19 DIAGNOSIS — D649 Anemia, unspecified: Secondary | ICD-10-CM | POA: Diagnosis not present

## 2013-01-19 DIAGNOSIS — E559 Vitamin D deficiency, unspecified: Secondary | ICD-10-CM | POA: Diagnosis not present

## 2013-01-20 DIAGNOSIS — E1149 Type 2 diabetes mellitus with other diabetic neurological complication: Secondary | ICD-10-CM | POA: Diagnosis not present

## 2013-01-20 DIAGNOSIS — M86179 Other acute osteomyelitis, unspecified ankle and foot: Secondary | ICD-10-CM | POA: Diagnosis not present

## 2013-01-20 DIAGNOSIS — M1A9XX1 Chronic gout, unspecified, with tophus (tophi): Secondary | ICD-10-CM | POA: Diagnosis not present

## 2013-01-20 DIAGNOSIS — L97409 Non-pressure chronic ulcer of unspecified heel and midfoot with unspecified severity: Secondary | ICD-10-CM | POA: Diagnosis not present

## 2013-02-02 DIAGNOSIS — Z1231 Encounter for screening mammogram for malignant neoplasm of breast: Secondary | ICD-10-CM | POA: Diagnosis not present

## 2013-02-08 DIAGNOSIS — R928 Other abnormal and inconclusive findings on diagnostic imaging of breast: Secondary | ICD-10-CM | POA: Diagnosis not present

## 2013-02-10 ENCOUNTER — Other Ambulatory Visit: Payer: Self-pay | Admitting: Internal Medicine

## 2013-02-10 ENCOUNTER — Other Ambulatory Visit: Payer: Self-pay | Admitting: Radiology

## 2013-02-10 DIAGNOSIS — M1A9XX1 Chronic gout, unspecified, with tophus (tophi): Secondary | ICD-10-CM | POA: Diagnosis not present

## 2013-02-10 DIAGNOSIS — L97409 Non-pressure chronic ulcer of unspecified heel and midfoot with unspecified severity: Secondary | ICD-10-CM | POA: Diagnosis not present

## 2013-02-10 DIAGNOSIS — C50919 Malignant neoplasm of unspecified site of unspecified female breast: Secondary | ICD-10-CM | POA: Diagnosis not present

## 2013-02-10 DIAGNOSIS — E1149 Type 2 diabetes mellitus with other diabetic neurological complication: Secondary | ICD-10-CM | POA: Diagnosis not present

## 2013-02-10 DIAGNOSIS — D0590 Unspecified type of carcinoma in situ of unspecified breast: Secondary | ICD-10-CM | POA: Diagnosis not present

## 2013-02-10 HISTORY — DX: Malignant neoplasm of unspecified site of unspecified female breast: C50.919

## 2013-02-11 ENCOUNTER — Other Ambulatory Visit: Payer: Self-pay | Admitting: Radiology

## 2013-02-11 DIAGNOSIS — C50912 Malignant neoplasm of unspecified site of left female breast: Secondary | ICD-10-CM

## 2013-02-14 ENCOUNTER — Telehealth: Payer: Self-pay | Admitting: *Deleted

## 2013-02-14 DIAGNOSIS — C50212 Malignant neoplasm of upper-inner quadrant of left female breast: Secondary | ICD-10-CM | POA: Insufficient documentation

## 2013-02-14 NOTE — Telephone Encounter (Signed)
Confirmed [facility] for 02/16/13 at 1200 .  Instructions and contact information given.

## 2013-02-16 ENCOUNTER — Other Ambulatory Visit: Payer: Medicare Other

## 2013-02-16 ENCOUNTER — Ambulatory Visit (HOSPITAL_BASED_OUTPATIENT_CLINIC_OR_DEPARTMENT_OTHER): Payer: Medicare Other | Admitting: General Surgery

## 2013-02-16 ENCOUNTER — Ambulatory Visit: Payer: Medicare Other

## 2013-02-16 ENCOUNTER — Other Ambulatory Visit: Payer: Self-pay | Admitting: Oncology

## 2013-02-16 ENCOUNTER — Ambulatory Visit: Payer: Medicare Other | Attending: General Surgery | Admitting: Physical Therapy

## 2013-02-16 ENCOUNTER — Encounter: Payer: Self-pay | Admitting: *Deleted

## 2013-02-16 ENCOUNTER — Other Ambulatory Visit: Payer: Medicare Other | Admitting: Lab

## 2013-02-16 ENCOUNTER — Encounter: Payer: Self-pay | Admitting: Oncology

## 2013-02-16 ENCOUNTER — Encounter (INDEPENDENT_AMBULATORY_CARE_PROVIDER_SITE_OTHER): Payer: Self-pay | Admitting: General Surgery

## 2013-02-16 ENCOUNTER — Encounter (INDEPENDENT_AMBULATORY_CARE_PROVIDER_SITE_OTHER): Payer: Self-pay

## 2013-02-16 ENCOUNTER — Ambulatory Visit (HOSPITAL_BASED_OUTPATIENT_CLINIC_OR_DEPARTMENT_OTHER): Payer: Medicare Other | Admitting: Oncology

## 2013-02-16 ENCOUNTER — Ambulatory Visit
Admission: RE | Admit: 2013-02-16 | Discharge: 2013-02-16 | Disposition: A | Discharge status: Home / Self Care | Payer: Medicare Other | Source: Ambulatory Visit | Attending: Radiation Oncology | Admitting: Radiation Oncology

## 2013-02-16 VITALS — BP 161/74 | HR 91 | Temp 98.5°F | Resp 18 | Ht 64.0 in | Wt 178.9 lb

## 2013-02-16 DIAGNOSIS — R262 Difficulty in walking, not elsewhere classified: Secondary | ICD-10-CM | POA: Diagnosis not present

## 2013-02-16 DIAGNOSIS — C50212 Malignant neoplasm of upper-inner quadrant of left female breast: Secondary | ICD-10-CM

## 2013-02-16 DIAGNOSIS — C50219 Malignant neoplasm of upper-inner quadrant of unspecified female breast: Secondary | ICD-10-CM

## 2013-02-16 DIAGNOSIS — R5381 Other malaise: Secondary | ICD-10-CM | POA: Insufficient documentation

## 2013-02-16 DIAGNOSIS — R293 Abnormal posture: Secondary | ICD-10-CM | POA: Insufficient documentation

## 2013-02-16 DIAGNOSIS — IMO0001 Reserved for inherently not codable concepts without codable children: Secondary | ICD-10-CM | POA: Insufficient documentation

## 2013-02-16 DIAGNOSIS — C50419 Malignant neoplasm of upper-outer quadrant of unspecified female breast: Secondary | ICD-10-CM

## 2013-02-16 DIAGNOSIS — C50919 Malignant neoplasm of unspecified site of unspecified female breast: Secondary | ICD-10-CM | POA: Diagnosis not present

## 2013-02-16 NOTE — Progress Notes (Signed)
ID: [name] OB: 02-21-1946  MR#: [id number]  [id number]  PCP: Alesia Richards, MD GYN:   SU: Rolm Bookbinder OTHER MD: Kyung Rudd, Meridee Score, Rick Cornella  CHIEF COMPLAINT: "They found breast cancer".  HISTORY OF PRESENT ILLNESS: [name] (also goes by [name]) had bilateral screening mammography at [facility] 02/02/2013. There was a 3 mm nodule in the left breast which was further evaluated November 4, confirming an irregular mass at the 1:00 position of the left breast, not confirmed by ultrasonography. Biopsy of this mass 02/10/2013 showed an invasive ductal carcinoma, grade 1, with a prognostic panel pending. Breast MRI has been scheduled for 02/24/2013  The patient's subsequent history is as detailed below  INTERVAL HISTORY: [name] was seen in the multidisciplinary breast cancer clinic 02/16/2013 accompanied by her husband [name]. Her case was also discussed at the multidisciplinary breast conference that same morning.  REVIEW OF SYSTEMS: There were no specific symptoms leading to her screening mammogram, which was routinely scheduled. She tolerated the biopsy without event. She has a variety of symptoms, which are not clearly related to her was cancer problem. This includes moderate fatigue, which does not significantly affect her activities. She occasional has stabbing pains in her feet and recently lost a total ("I was careless with my shoes"). She has sinus problems, sleeps on a wedge pillow, tells me she has kidney disease, bruises easily secondary to aspirin, has chronic back pain, has baseline peripheral neuropathy grade 1, feels anxious but not depressed. She tells me her diabetes is currently not well controlled. She is not able to exercise because of her recent toe surgery. A detailed review of systems was otherwise noncontributory   PAST MEDICAL HISTORY: Past Medical History  Diagnosis Date  . Hypertension   . Coronary artery disease     NSTEMI in 05/2009;  LHC with  2 v CAD - > CABG (LIMA-LAD, SVG-diagonal, SVG-OM1/OM 2).   . Hyperlipidemia   . Ischemic cardiomyopathy     Echo 07/2009: Mild LVH, EF 40-45%, inferior and posterior HK, grade 1 diastolic dysfunction, moderate MAC, mild to moderate MR, mild LAE  . PAD (peripheral artery disease)   . Family history of anesthesia complication     Mom has a hard time to wake up  . Neuropathy     Hx; of B/L feet  . Type II diabetes mellitus   . Foot ulcer     "I've had them on both feet" (08/27/2012)  . Osteomyelitis of foot   . Sinus headache     "occasionally" (08/27/2012)  . Arthritis     "maybe in my lower back" (08/27/2012)  . Gouty arthritis     "right index finger" (08/27/2012)  . CKD (chronic kidney disease), stage I   . NSTEMI (non-ST elevated myocardial infarction) 05/23/2009    Archie Endo 05/23/2009 (08/27/2012)  . Breast cancer     PAST SURGICAL HISTORY: Past Surgical History  Procedure Laterality Date  . Cataract extraction w/ intraocular lens  implant, bilateral  2000's  . Finger surgery Right     "index finger; turned out to be gout" (08/27/2012)  . Coronary artery bypass graft  2011    "CABG X4" (08/27/2012)  . Dental surgery  04/30/11    "1 implant" (08/27/2012)  . Amputation ray Right 08/27/2012    5th ray/notes 08/27/2012  . Cardiac catheterization  05/24/2009    Archie Endo 05/24/2009 (08/27/2012)  . Amputation Right 08/27/2012    Procedure: AMPUTATION RAY;  Surgeon: Newt Minion, MD;  Location: [facility]  OR;  Service: Orthopedics;  Laterality: Right;  Right Foot 5th Ray Amputation    FAMILY HISTORY Family History  Problem Relation Age of Onset  . Hypertension Brother   . Kidney cancer Brother   . Breast cancer Brother   . Cancer Sister   . Cancer Brother   . Throat cancer Father   . Breast cancer Maternal Aunt    patient's father died at the age of 46 from throat cancer. The patient's mother died at the age of 55 from "many medical problems". The patient had 4 brothers, 3 sisters. One brother had  kidney cancer diagnosed at age 70. One sister was diagnosed with breast cancer at age 45 and died 2012/11/03. A second sister was diagnosed with breast cancer at age 13. There is also one of the patient's mother's 5 sisters diagnosed with breast cancer remotely.  GYNECOLOGIC HISTORY:  Menarche age 26 am first live birth age 26, the patient is [place] P14. (She had a twin pregnancy, [name] and [name], delivered prematurely, with the babies dying after a half day). She underwent menopause in her late 67s and used hormone replacement for a few months.  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 Their both now retired. Son [name] lives in [place] where he works as a Proofreader. The patient has 3 grandchildren. She attends [facility] [employer] in [place]: In place   HEALTH MAINTENANCE: History  Substance Use Topics  . Smoking status: Never Smoker   . Smokeless tobacco: Never Used  . Alcohol Use: No     Colonoscopy: 2010  PAP: 2010  Bone density: 10/21/2007 at [facility], normal  Lipid panel:  Allergies  Allergen Reactions  . Atenolol Other (See Comments)    Exacerbates gout  . Latex Rash  . Omnipaque [Iohexol] Rash    Current Outpatient Prescriptions  Medication Sig Dispense Refill  . allopurinol (ZYLOPRIM) 100 MG tablet Take 100 mg by mouth 2 (two) times daily.      . Alpha-Lipoic Acid 600 MG CAPS Take 600 mg by mouth 2 (two) times daily.      Marland Kitchen aspirin 325 MG tablet Take 325 mg by mouth at bedtime.       . carvedilol (COREG) 12.5 MG tablet Take 1 tablet (12.5 mg total) by mouth 2 (two) times daily with a meal.  180 tablet  0  . cholecalciferol (VITAMIN D) 1000 UNITS tablet Take 2,000 Units by mouth daily.      . Cinnamon 500 MG capsule Take 1,000 mg by mouth 2 (two) times daily.       Marland Kitchen COLCRYS 0.6 MG tablet Take 0.6 mg by mouth daily as needed (for gout flareups).       . fish oil-omega-3 fatty  acids 1000 MG capsule Take 1 g by mouth 2 (two) times daily.       Marland Kitchen glimepiride (AMARYL) 4 MG tablet Take 4 mg by mouth 2 (two) times daily.       . insulin glargine (LANTUS) 100 UNIT/ML injection Inject 30-35 Units into the skin every morning.       . metFORMIN (GLUCOPHAGE) 1000 MG tablet Take 1,000 mg by mouth 2 (two) times daily with a meal.      . rosuvastatin (CRESTOR) 20 MG tablet Take 20 mg by mouth daily.      . valsartan (DIOVAN) 320 MG tablet Take 320 mg by mouth  daily.      . hydrochlorothiazide (MICROZIDE) 12.5 MG capsule Take 1 capsule (12.5 mg total) by mouth daily.       No current facility-administered medications for this visit.    OBJECTIVE: Middle-aged white woman in no acute distress Filed Vitals:   02/16/13 1235  BP: 161/74  Pulse: 91  Temp: 98.5 F (36.9 C)  Resp: 18     Body mass index is 30.69 kg/(m^2).    ECOG FS:1 - Symptomatic but completely ambulatory  Ocular: Sclerae unicteric, pupils equal, round and reactive to light Ear-nose-throat: Oropharynx clear, dentition fair Lymphatic: No cervical or supraclavicular adenopathy Lungs no rales or rhonchi, good excursion bilaterally Heart regular rate and rhythm, no murmur appreciated Abd soft, nontender, positive bowel sounds MSK no focal spinal tenderness, no joint edema, shoes not removed Neuro: non-focal, well-oriented, appropriate affect Breasts: The right breast is unremarkable. The left breast is status post recent biopsy. There is a minimal ecchymosis. I do not palpate a mass. There is no skin or nipple change of concern. The left axilla is benign.   LAB RESULTS:  CMP     Component Value Date/Time   NA 133* 11/20/2012 1250   K 4.9 11/20/2012 1250   CL 96 11/20/2012 1250   CO2 26 11/20/2012 1250   GLUCOSE 184* 11/20/2012 1250   BUN 49* 11/20/2012 1250   CREATININE 1.77* 11/20/2012 1250   CALCIUM 9.7 11/20/2012 1250   PROT 7.2 08/27/2012 1124   ALBUMIN 2.9* 08/27/2012 1124   AST 14 08/27/2012 1124   ALT  14 08/27/2012 1124   ALKPHOS 87 08/27/2012 1124   BILITOT 0.3 08/27/2012 1124   GFRNONAA 29* 11/20/2012 1250   GFRAA 33* 11/20/2012 1250    I No results found for this basename: SPEP, UPEP,  kappa and lambda light chains    Lab Results  Component Value Date   WBC 15.6* 11/20/2012   NEUTROABS 12.2* 11/20/2012   HGB 12.8 11/20/2012   HCT 36.9 11/20/2012   MCV 86.4 11/20/2012   PLT 236 11/20/2012      Chemistry      Component Value Date/Time   NA 133* 11/20/2012 1250   K 4.9 11/20/2012 1250   CL 96 11/20/2012 1250   CO2 26 11/20/2012 1250   BUN 49* 11/20/2012 1250   CREATININE 1.77* 11/20/2012 1250      Component Value Date/Time   CALCIUM 9.7 11/20/2012 1250   ALKPHOS 87 08/27/2012 1124   AST 14 08/27/2012 1124   ALT 14 08/27/2012 1124   BILITOT 0.3 08/27/2012 1124       No results found for this basename: LABCA2    No components found with this basename: LABCA125    No results found for this basename: INR,  in the last 168 hours  Urinalysis    Component Value Date/Time   COLORURINE YELLOW 05/30/2009 1500   APPEARANCEUR CLOUDY* 05/30/2009 1500   LABSPEC 1.029 05/30/2009 1500   PHURINE 5.0 05/30/2009 1500   GLUCOSEU NEGATIVE 05/30/2009 1500   HGBUR TRACE* 05/30/2009 1500   BILIRUBINUR NEGATIVE 05/30/2009 1500   KETONESUR NEGATIVE 05/30/2009 1500   PROTEINUR 100* 05/30/2009 1500   UROBILINOGEN 0.2 05/30/2009 1500   NITRITE NEGATIVE 05/30/2009 1500   LEUKOCYTESUR NEGATIVE 05/30/2009 1500    STUDIES: No results found.  ASSESSMENT: 67 y.o. [place] woman status post left breast biopsy 02/10/2013 for a clinical T1a N0, stage IA invasive ductal carcinoma, grade 1, with a prognostic panel pending  PLAN: We spent the  better part of today's hour-long appointment discussing the biology of breast cancer in general, and the specifics of the patient's tumor in particular. Gaige understands if her tumor is truly 5 mm or less, chemotherapy would not be considered as her very good prognosis  would not warrant it. She may be a candidate for antiestrogen therapy if her tumor is estrogen receptor positive, which is very likely. She understands the need of radiation adjuvantly after lumpectomy.  The patient's family history warrants genetic testing and this has been scheduled for 02/17/2013.  On the assumption that chemotherapy will not be needed, I have made a return appointment for Raniyah in early January 20 15th. I then she should have completed her local treatment and should be ready to discuss antiestrogen to.  She knows to call for any problems that may develop before that visit.   Chauncey Cruel, MD   02/16/2013 7:36 PM

## 2013-02-16 NOTE — Progress Notes (Signed)
Patient ID: [name], female   DOB: 1945-06-22, 67 y.o.   MRN: [id number]  Chief Complaint  Patient presents with  . breast caner    HPI [name] is a 67 y.o. female.  Referred by Dr Christene Slates and Dr Melford Aase HPI 89 yof with no prior breast history who presents after undergoing screening mm that shows a 19mm nodule in the left breast that was indeterminate.  Breast composition category B.  Ultrasound was negative.  She underwent stereotactic biopsy that shows an invasive ductal carcinoma, dcis with calcifications, grade I, er pos at 100, pr pos at 100, Ki 8% and her 2 neu is pending.  She has no complaints referable to either breast and comes in today to discuss options.  Past Medical History  Diagnosis Date  . Hypertension   . Coronary artery disease     NSTEMI in 05/2009;  LHC with 2 v CAD - > CABG (LIMA-LAD, SVG-diagonal, SVG-OM1/OM 2).   . Hyperlipidemia   . Ischemic cardiomyopathy     Echo 07/2009: Mild LVH, EF 40-45%, inferior and posterior HK, grade 1 diastolic dysfunction, moderate MAC, mild to moderate MR, mild LAE  . PAD (peripheral artery disease)   . Family history of anesthesia complication     Mom has a hard time to wake up  . Neuropathy     Hx; of B/L feet  . Type II diabetes mellitus   . Foot ulcer     "I've had them on both feet" (08/27/2012)  . Osteomyelitis of foot   . Sinus headache     "occasionally" (08/27/2012)  . Arthritis     "maybe in my lower back" (08/27/2012)  . Gouty arthritis     "right index finger" (08/27/2012)  . CKD (chronic kidney disease), stage I   . NSTEMI (non-ST elevated myocardial infarction) 05/23/2009    Archie Endo 05/23/2009 (08/27/2012)  . Breast cancer     Past Surgical History  Procedure Laterality Date  . Cataract extraction w/ intraocular lens  implant, bilateral  2000's  . Finger surgery Right     "index finger; turned out to be gout" (08/27/2012)  . Coronary artery bypass graft  2011    "CABG X4" (08/27/2012)  . Dental  surgery  04/30/11    "1 implant" (08/27/2012)  . Amputation ray Right 08/27/2012    5th ray/notes 08/27/2012  . Cardiac catheterization  05/24/2009    Archie Endo 05/24/2009 (08/27/2012)  . Amputation Right 08/27/2012    Procedure: AMPUTATION RAY;  Surgeon: Newt Minion, MD;  Location: [facility];  Service: Orthopedics;  Laterality: Right;  Right Foot 5th Ray Amputation    Family History  Problem Relation Age of Onset  . Hypertension Brother   . Kidney cancer Brother   . Breast cancer Brother   . Cancer Sister   . Cancer Brother   . Throat cancer Father   . Breast cancer Maternal Aunt     Social History History  Substance Use Topics  . Smoking status: Never Smoker   . Smokeless tobacco: Never Used  . Alcohol Use: No    Allergies  Allergen Reactions  . Atenolol Other (See Comments)    Exacerbates gout  . Latex Rash  . Omnipaque [Iohexol] Rash    Current Outpatient Prescriptions  Medication Sig Dispense Refill  . allopurinol (ZYLOPRIM) 100 MG tablet Take 100 mg by mouth 2 (two) times daily.      . Alpha-Lipoic Acid 600 MG CAPS Take 600 mg by mouth  2 (two) times daily.      Marland Kitchen aspirin 325 MG tablet Take 325 mg by mouth at bedtime.       . carvedilol (COREG) 12.5 MG tablet Take 1 tablet (12.5 mg total) by mouth 2 (two) times daily with a meal.  180 tablet  0  . cholecalciferol (VITAMIN D) 1000 UNITS tablet Take 2,000 Units by mouth daily.      . Cinnamon 500 MG capsule Take 1,000 mg by mouth 2 (two) times daily.       Marland Kitchen COLCRYS 0.6 MG tablet Take 0.6 mg by mouth daily as needed (for gout flareups).       . fish oil-omega-3 fatty acids 1000 MG capsule Take 1 g by mouth 2 (two) times daily.       Marland Kitchen glimepiride (AMARYL) 4 MG tablet Take 4 mg by mouth 2 (two) times daily.       . hydrochlorothiazide (MICROZIDE) 12.5 MG capsule Take 1 capsule (12.5 mg total) by mouth daily.      . insulin glargine (LANTUS) 100 UNIT/ML injection Inject 30-35 Units into the skin every morning.       . metFORMIN  (GLUCOPHAGE) 1000 MG tablet Take 1,000 mg by mouth 2 (two) times daily with a meal.      . rosuvastatin (CRESTOR) 20 MG tablet Take 20 mg by mouth daily.      . valsartan (DIOVAN) 320 MG tablet Take 320 mg by mouth daily.       No current facility-administered medications for this visit.    Review of Systems Review of Systems  Constitutional: Positive for fatigue. Negative for fever, chills and unexpected weight change.  HENT: Positive for sinus pressure. Negative for congestion, hearing loss, sore throat, trouble swallowing and voice change.   Eyes: Negative for visual disturbance.  Respiratory: Negative for cough and wheezing.   Cardiovascular: Negative for chest pain, palpitations and leg swelling.  Gastrointestinal: Negative for nausea, vomiting, abdominal pain, diarrhea, constipation, blood in stool, abdominal distention and anal bleeding.  Genitourinary: Negative for hematuria, vaginal bleeding and difficulty urinating.  Musculoskeletal: Positive for back pain. Negative for arthralgias.  Skin: Negative for rash and wound.  Neurological: Negative for seizures, syncope and headaches.  Hematological: Negative for adenopathy. Does not bruise/bleed easily.  Psychiatric/Behavioral: Negative for confusion.    There were no vitals taken for this visit.  Physical Exam Physical Exam  Vitals reviewed. Constitutional: She appears well-developed and well-nourished.  Eyes: No scleral icterus.  Neck: Neck supple.  Cardiovascular: Normal rate, regular rhythm and normal heart sounds.   Pulmonary/Chest: Effort normal and breath sounds normal. She has no wheezes. She has no rales. Right breast exhibits no inverted nipple, no mass, no nipple discharge, no skin change and no tenderness. Left breast exhibits no inverted nipple, no mass, no nipple discharge, no skin change and no tenderness.  Lymphadenopathy:    She has no cervical adenopathy.    Data Reviewed Mm reviewed  Assessment     Clinical stage I breast cancer    Plan    Genetic testing first (she has sister that has passed from breast cancer and another sister who may have had dcis) then will decide on surgery.  She will see Roma Kayser tomorrow.  She is going to try and get her sisters results.   Likely left breast wire guided lumpectomy with sentinel node biopsy We discussed the staging and pathophysiology of breast cancer. We discussed all of the different options for treatment for  breast cancer including surgery, chemotherapy, radiation therapy, Herceptin, and antiestrogen therapy.   We discussed a sentinel lymph node biopsy as she does not appear to having lymph node involvement right now. We discussed the performance of that with injection of radioactive tracer and possibly blue dye. We discussed that she would have an incision underneath her axillary hairline. We discussed that there is about a 5% chance of having a positive node with a sentinel lymph node biopsy and we will await the permanent pathology to make any other first further decisions in terms of her treatment. One of these options might be to return to the operating room to perform an axillary lymph node dissection. We discussed about a 5% risk lifetime of chronic shoulder pain as well as lymphedema associated with a sentinel lymph node biopsy.  We discussed the options for treatment of the breast cancer which included lumpectomy versus a mastectomy. We discussed the performance of the lumpectomy with a wire placement. We discussed a 5% chance of a positive margin requiring reexcision in the operating room. We also discussed that she may need radiation therapy or antiestrogen therapy or both if she undergoes lumpectomy. We discussed the mastectomy and the postoperative care for that as well. We discussed that there is no difference in her survival whether she undergoes lumpectomy with radiation therapy or antiestrogen therapy versus a mastectomy. I will have  her come back to my office in about 2 weeks. We discussed the risks of operation including bleeding, infection, possible reoperation. She understands her further therapy will be based on what her stages at the time of her operation.         Horace Lukas 02/16/2013, 4:05 PM

## 2013-02-16 NOTE — Progress Notes (Signed)
Checked in new pt with no financial concerns. °

## 2013-02-17 ENCOUNTER — Other Ambulatory Visit: Payer: Medicare Other | Admitting: Lab

## 2013-02-17 ENCOUNTER — Telehealth: Payer: Self-pay | Admitting: Oncology

## 2013-02-17 ENCOUNTER — Encounter: Payer: Self-pay | Admitting: *Deleted

## 2013-02-17 ENCOUNTER — Ambulatory Visit (HOSPITAL_BASED_OUTPATIENT_CLINIC_OR_DEPARTMENT_OTHER): Payer: Medicare Other | Admitting: Genetic Counselor

## 2013-02-17 ENCOUNTER — Encounter: Payer: Self-pay | Admitting: Genetic Counselor

## 2013-02-17 DIAGNOSIS — C50919 Malignant neoplasm of unspecified site of unspecified female breast: Secondary | ICD-10-CM

## 2013-02-17 DIAGNOSIS — IMO0002 Reserved for concepts with insufficient information to code with codable children: Secondary | ICD-10-CM

## 2013-02-17 DIAGNOSIS — Z803 Family history of malignant neoplasm of breast: Secondary | ICD-10-CM | POA: Diagnosis not present

## 2013-02-17 DIAGNOSIS — Z8051 Family history of malignant neoplasm of kidney: Secondary | ICD-10-CM | POA: Diagnosis not present

## 2013-02-17 DIAGNOSIS — C50419 Malignant neoplasm of upper-outer quadrant of unspecified female breast: Secondary | ICD-10-CM | POA: Diagnosis not present

## 2013-02-17 NOTE — Telephone Encounter (Signed)
, °

## 2013-02-17 NOTE — Progress Notes (Signed)
Faxed Care Plan to PCP.

## 2013-02-17 NOTE — Progress Notes (Signed)
Faxed Care Plan, Dr. Jana Hakim & Dr. Cristal Generous office notes to [facility] at [facility].  Took care plan to med rec to scan.

## 2013-02-17 NOTE — Progress Notes (Signed)
Dr.  Lurline Del requested a consultation for genetic counseling and risk assessment for [name], a 67 y.o. female, for discussion of her personal and family history of breast cancer and family history of kidney cancer.  She presents to clinic today to discuss the possibility of a genetic predisposition to cancer, and to further clarify her risks, as well as her family members' risks for cancer.   HISTORY OF PRESENT ILLNESS: In 2014, at the age of 1, [name] was diagnosed with breast cancer. We are waiting on the prognostic factors.    Past Medical History  Diagnosis Date  . Hypertension   . Coronary artery disease     NSTEMI in 05/2009;  LHC with 2 v CAD - > CABG (LIMA-LAD, SVG-diagonal, SVG-OM1/OM 2).   . Hyperlipidemia   . Ischemic cardiomyopathy     Echo 07/2009: Mild LVH, EF 40-45%, inferior and posterior HK, grade 1 diastolic dysfunction, moderate MAC, mild to moderate MR, mild LAE  . PAD (peripheral artery disease)   . Family history of anesthesia complication     Mom has a hard time to wake up  . Neuropathy     Hx; of B/L feet  . Type II diabetes mellitus   . Foot ulcer     "I've had them on both feet" (08/27/2012)  . Osteomyelitis of foot   . Sinus headache     "occasionally" (08/27/2012)  . Arthritis     "maybe in my lower back" (08/27/2012)  . Gouty arthritis     "right index finger" (08/27/2012)  . CKD (chronic kidney disease), stage I   . NSTEMI (non-ST elevated myocardial infarction) 05/23/2009    Archie Endo 05/23/2009 (08/27/2012)  . Breast cancer 2014    Past Surgical History  Procedure Laterality Date  . Cataract extraction w/ intraocular lens  implant, bilateral  2000's  . Finger surgery Right     "index finger; turned out to be gout" (08/27/2012)  . Coronary artery bypass graft  2011    "CABG X4" (08/27/2012)  . Dental surgery  04/30/11    "1 implant" (08/27/2012)  . Amputation ray Right 08/27/2012    5th ray/notes 08/27/2012  . Cardiac  catheterization  05/24/2009    Archie Endo 05/24/2009 (08/27/2012)  . Amputation Right 08/27/2012    Procedure: AMPUTATION RAY;  Surgeon: Newt Minion, MD;  Location: [facility];  Service: Orthopedics;  Laterality: Right;  Right Foot 5th Ray Amputation    History   Social History  . Marital Status: Married    Spouse Name: [name]    Number of Children: 1  . Years of Education: N/A   Social History Main Topics  . Smoking status: Never Smoker   . Smokeless tobacco: Never Used  . Alcohol Use: No  . Drug Use: No  . Sexual Activity: Not Currently   Other Topics Concern  . None   Social History Narrative  . None    REPRODUCTIVE HISTORY AND PERSONAL RISK ASSESSMENT FACTORS: Menarche was at age 37.   postmenopausal Uterus Intact: yes Ovaries Intact: yes [id number], first live birth at age 65  She has not previously undergone treatment for infertility.   Oral Contraceptive use: 2 years   She has used HRT in the past.    FAMILY HISTORY:  We obtained a detailed, 4-generation family history.  Significant diagnoses are listed below: Family History  Problem Relation Age of Onset  . Kidney cancer Brother 26  . Hypertension Brother   . Breast  cancer Sister 75    LCIS; BRCA negative  . Throat cancer Father 69    smoker  . Breast cancer Maternal Aunt     dx in her 65s  . Breast cancer Sister 46    Lobular breast cancer  the patient's mother had 89 brothers and sisters, only one had breast cancer in her 10s.  She is not aware of cousins having cancer on her mother's side. Her father had one full brother and two maternal half brothers and two maternal half sisters.  None of whom had cancer.  Reportedly her sister had genetic testing that was negative.   Patient's ancestors are of [place] descent. There is no reported Ashkenazi Jewish ancestry. There is no known consanguinity.  GENETIC COUNSELING ASSESSMENT: [name] is a 67 y.o. female with a personal history of breast cancer  and family history of breast and kidney cancer which somewhat suggestive of a hereditary cancer syndrome and predisposition to cancer. We, therefore, discussed and recommended the following at today's visit.   DISCUSSION: We reviewed the characteristics, features and inheritance patterns of hereditary cancer syndromes. We also discussed genetic testing, including the appropriate family members to test, the process of testing, insurance coverage and turn-around-time for results. We discussed hereditary cancer syndromes and her sisters "negative genetic testing".  We discussed that if the patient tests negative for hereditary cancer syndromes, that her sister Jerrye Beavers should consider updated testing as she was the youngest age of onset of cancer and therefore would be more informative for the family.  PLAN: After considering the risks, benefits, and limitations, Troy Tunick provided informed consent to pursue genetic testing and the blood sample will be sent to [facility] for analysis of the Breast/Ovarian cancer panel. We discussed the implications of a positive, negative and/ or variant of uncertain significance genetic test result. Results should be available within approximately 3 weeks' time, at which point they will be disclosed by telephone to [name], as will any additional recommendations warranted by these results. [name] will receive a summary of her genetic counseling visit and a copy of her results once available. This information will also be available in Epic. We encouraged [name] to remain in contact with cancer genetics annually so that we can continuously update the family history and inform her of any changes in cancer genetics and testing that may be of benefit for her family. [name]'s questions were answered to her satisfaction today. Our contact information was provided should additional questions or concerns arise.  The patient was seen for a total of 60  minutes, greater than 50% of which was spent face-to-face counseling.  This note will also be sent to the referring provider via the electronic medical record. The patient will be supplied with a summary of this genetic counseling discussion as well as educational information on the discussed hereditary cancer syndromes following the conclusion of their visit.   Patient was discussed with Dr. Marcy Panning.   _______________________________________________________________________ For Office Staff:  Number of people involved in session: 1 Was an Intern/ student involved with case: yes

## 2013-02-21 ENCOUNTER — Telehealth: Payer: Self-pay | Admitting: Cardiovascular Disease

## 2013-02-21 ENCOUNTER — Telehealth: Payer: Self-pay | Admitting: *Deleted

## 2013-02-21 NOTE — Telephone Encounter (Signed)
New Problem:  Pt states she is returning Christine's phone call from last week. Pt states she does not know why the nurse was calling. No open encounter in Epic. Pt would like to speak to a nurse. Please advise

## 2013-02-21 NOTE — Telephone Encounter (Signed)
Spoke with patient and she is returning call to [facility].  I am assuming to schedule appointment for PCN to obtain clearance for lumpectomy due to recent diagnosis of breast CA.  I scheduled for 03/10/13.  I did not see any other reason why patient was calling    Lumpectomy and genetic counseling---none are scheduled yet

## 2013-02-21 NOTE — Telephone Encounter (Signed)
Left vm for pt to call back regarding [place] from 02/16/13.

## 2013-02-22 NOTE — Progress Notes (Signed)
Radiation Oncology         [phone] ________________________________  Name: [name] MRN: [id number]  Date: 02/16/2013  DOB: 10-21-1945  [id number] DAVID, MD  Rolm Bookbinder, MD     REFERRING PHYSICIAN: Rolm Bookbinder, MD   DIAGNOSIS: The encounter diagnosis was Breast cancer of upper-inner quadrant of left female breast.   HISTORY OF PRESENT ILLNESS::[name] is a 67 y.o. female who is seen for an initial consultation visit. The patient is seen today in multidisciplinary breast clinic. Her case was discussed in breast conference this morning. She was found to have a suspicious nodule within the left breast during a recent screening mammogram. His was a small area measuring 3 mm. The ultrasound was negative and she proceeded to undergo a stereotactic biopsy. This revealed an invasive ductal carcinoma with associated DCIS. This was a grade 1 tumor. Receptor studies have indicated that the tumor is ER positive, PR positive, and the HER-2/neu is pending. The Ki-67 staining was 8%. The patient does not have any complaints regarding her breasts today. She was completely asymptomatic.   PREVIOUS RADIATION THERAPY:No}   PAST MEDICAL HISTORY:  has a past medical history of Hypertension; Coronary artery disease; Hyperlipidemia; Ischemic cardiomyopathy; PAD (peripheral artery disease); Family history of anesthesia complication; Neuropathy; Type II diabetes mellitus; Foot ulcer; Osteomyelitis of foot; Sinus headache; Arthritis; Gouty arthritis; CKD (chronic kidney disease), stage I; NSTEMI (non-ST elevated myocardial infarction) (05/23/2009); and Breast cancer (2014).     PAST SURGICAL HISTORY: Past Surgical History  Procedure Laterality Date  . Cataract extraction w/ intraocular lens  implant, bilateral  2000's  . Finger surgery Right     "index finger; turned out to be gout" (08/27/2012)  . Coronary artery bypass graft  2011    "CABG X4" (08/27/2012)  . Dental  surgery  04/30/11    "1 implant" (08/27/2012)  . Amputation ray Right 08/27/2012    5th ray/notes 08/27/2012  . Cardiac catheterization  05/24/2009    Archie Endo 05/24/2009 (08/27/2012)  . Amputation Right 08/27/2012    Procedure: AMPUTATION RAY;  Surgeon: Newt Minion, MD;  Location: [facility];  Service: Orthopedics;  Laterality: Right;  Right Foot 5th Ray Amputation     FAMILY HISTORY: family history includes Breast cancer in her maternal aunt; Breast cancer (age of onset: 46) in her sister; Breast cancer (age of onset: 28) in her sister; Hypertension in her brother; Kidney cancer (age of onset: 62) in her brother; Throat cancer (age of onset: 55) in her father.   SOCIAL HISTORY:  reports that she has never smoked. She has never used smokeless tobacco. She reports that she does not drink alcohol or use illicit drugs.   ALLERGIES: Atenolol; Latex; and Omnipaque   MEDICATIONS:  Current Outpatient Prescriptions  Medication Sig Dispense Refill  . allopurinol (ZYLOPRIM) 100 MG tablet Take 100 mg by mouth 2 (two) times daily.      . Alpha-Lipoic Acid 600 MG CAPS Take 600 mg by mouth 2 (two) times daily.      Marland Kitchen aspirin 325 MG tablet Take 325 mg by mouth at bedtime.       . carvedilol (COREG) 12.5 MG tablet Take 1 tablet (12.5 mg total) by mouth 2 (two) times daily with a meal.  180 tablet  0  . cholecalciferol (VITAMIN D) 1000 UNITS tablet Take 2,000 Units by mouth daily.      . Cinnamon 500 MG capsule Take 1,000 mg by mouth 2 (two) times daily.       Marland Kitchen  COLCRYS 0.6 MG tablet Take 0.6 mg by mouth daily as needed (for gout flareups).       . fish oil-omega-3 fatty acids 1000 MG capsule Take 1 g by mouth 2 (two) times daily.       Marland Kitchen glimepiride (AMARYL) 4 MG tablet Take 4 mg by mouth 2 (two) times daily.       . hydrochlorothiazide (MICROZIDE) 12.5 MG capsule Take 1 capsule (12.5 mg total) by mouth daily.      . insulin glargine (LANTUS) 100 UNIT/ML injection Inject 30-35 Units into the skin every morning.        . metFORMIN (GLUCOPHAGE) 1000 MG tablet Take 1,000 mg by mouth 2 (two) times daily with a meal.      . rosuvastatin (CRESTOR) 20 MG tablet Take 20 mg by mouth daily.      . valsartan (DIOVAN) 320 MG tablet Take 320 mg by mouth daily.       No current facility-administered medications for this encounter.     REVIEW OF SYSTEMS:  A 15 point review of systems is documented in the electronic medical record. This was obtained by the nursing staff. However, I reviewed this with the patient to discuss relevant findings and make appropriate changes.  Pertinent items are noted in HPI.    PHYSICAL EXAM:  vitals were not taken for this visit.  General: Well-developed, in no acute distress HEENT: Normocephalic, atraumatic; oral cavity clear Neck: Supple without any lymphadenopathy Cardiovascular: Regular rate and rhythm Respiratory: Clear to auscultation bilaterally Breasts:   Status post biopsy on the left. No mass or skin change. No nipple changes. No axillary adenopathy on the left. Negative clinical exam in terms of right breast/right axilla. GI: Soft, nontender, normal bowel sounds Extremities: No edema present Neuro: No focal deficits     LABORATORY DATA:  Lab Results  Component Value Date   WBC 15.6* 11/20/2012   HGB 12.8 11/20/2012   HCT 36.9 11/20/2012   MCV 86.4 11/20/2012   PLT 236 11/20/2012   Lab Results  Component Value Date   NA 133* 11/20/2012   K 4.9 11/20/2012   CL 96 11/20/2012   CO2 26 11/20/2012   Lab Results  Component Value Date   ALT 14 08/27/2012   AST 14 08/27/2012   ALKPHOS 87 08/27/2012   BILITOT 0.3 08/27/2012      RADIOGRAPHY: No results found.     IMPRESSION: The patient is a pleasant 67 year old female who has a recent diagnosis of an invasive ductal carcinoma of the left breast. This was a small tumor representing a T1 A. N0 M0 tumor clinically. She is an appropriate candidate for breast conservation treatment it was felt in clinic today. She is  seeing Dr. Donne Hazel and surgery and he has discussed treatment options. The patient is interested in lumpectomy at this time.  I therefore discussed with the patient the role of adjuvant radiotherapy in the setting of breast cancer to improve local/regional control. We discussed the possible benefit therefore as well as the possible side effects and risks. All of her questions were answered.  The patient is interested in proceeding with a lumpectomy/adjuvant radiotherapy. She also is seeing Dr. Jana Hakim in medical oncology regarding systemic treatment. No clear role for chemotherapy at this time.   PLAN: The patient will proceed with a lumpectomy and I look forward to seeing her postoperatively to evaluate her recovery and to further discuss and anticipated course of adjuvant radiation treatment.  I spent 60 minutes face to face with the patient and more than 50% of that time was spent in counseling and/or coordination of care.    ________________________________   Jodelle Gross, MD, PhD

## 2013-02-23 NOTE — Telephone Encounter (Signed)
PT  AWARE MAY PROCEED WITH SURGERY  AND  ALSO TO KEEP  F/U  WITH  DR NISHAN./CY

## 2013-02-24 ENCOUNTER — Encounter: Payer: Self-pay | Admitting: *Deleted

## 2013-02-24 ENCOUNTER — Other Ambulatory Visit: Payer: Medicare Other

## 2013-02-24 NOTE — Progress Notes (Signed)
Mailed after appt letter to pt. 

## 2013-02-25 DIAGNOSIS — R030 Elevated blood-pressure reading, without diagnosis of hypertension: Secondary | ICD-10-CM | POA: Diagnosis not present

## 2013-02-25 DIAGNOSIS — IMO0002 Reserved for concepts with insufficient information to code with codable children: Secondary | ICD-10-CM | POA: Diagnosis not present

## 2013-02-25 DIAGNOSIS — Z23 Encounter for immunization: Secondary | ICD-10-CM | POA: Diagnosis not present

## 2013-03-01 ENCOUNTER — Encounter (INDEPENDENT_AMBULATORY_CARE_PROVIDER_SITE_OTHER): Payer: Self-pay

## 2013-03-01 ENCOUNTER — Telehealth (INDEPENDENT_AMBULATORY_CARE_PROVIDER_SITE_OTHER): Payer: Self-pay

## 2013-03-01 NOTE — Telephone Encounter (Signed)
Called pt to notify her that we received cardiac clearance from Nishan's office and I need to make her an f/u appt with Dr Donne Hazel for next week to discuss surgery. I offered pt to come in on 12/2 but pt declined the appt and requested for 12/5. I made the appt for 12/5 with Dr Donne Hazel.

## 2013-03-10 ENCOUNTER — Encounter: Payer: Self-pay | Admitting: Cardiovascular Disease

## 2013-03-10 ENCOUNTER — Ambulatory Visit (INDEPENDENT_AMBULATORY_CARE_PROVIDER_SITE_OTHER): Payer: Medicare Other | Admitting: Cardiovascular Disease

## 2013-03-10 VITALS — BP 168/85 | HR 85 | Ht 64.0 in | Wt 182.0 lb

## 2013-03-10 DIAGNOSIS — I10 Essential (primary) hypertension: Secondary | ICD-10-CM

## 2013-03-10 DIAGNOSIS — I2510 Atherosclerotic heart disease of native coronary artery without angina pectoris: Secondary | ICD-10-CM | POA: Diagnosis not present

## 2013-03-10 DIAGNOSIS — I739 Peripheral vascular disease, unspecified: Secondary | ICD-10-CM

## 2013-03-10 DIAGNOSIS — E785 Hyperlipidemia, unspecified: Secondary | ICD-10-CM

## 2013-03-10 NOTE — Patient Instructions (Signed)
Your physician wants you to follow-up in: [place] will receive a reminder letter in the mail two months in advance. If you don't receive a letter, please call our office to schedule the follow-up appointment. Your physician recommends that you continue on your current medications as directed. Please refer to the Current Medication list given to you today. WILL CALL DUE IN  6 MONTHS   FOR LOWER ART  AND  SEE  DR Fletcher Anon

## 2013-03-10 NOTE — Assessment & Plan Note (Signed)
Stable with no angina and good activity level.  Continue medical Rx  

## 2013-03-10 NOTE — Progress Notes (Signed)
Patient ID: [name], female   DOB: 1945/04/09, 67 y.o.   MRN: [id number] [name] is a 67 y.o. female who returns for surgical clearance.  She has a history of CAD, status post NSTEMI in 05/2009, DM2, HTN, HL, PAD. LHC at the time of her MI demonstrated severe two-vessel CAD and an ejection fraction of 45%. She successfully underwent CABG (LIMA-LAD, SVG-diagonal, SVG-OM1/OM 2). Echo 07/2009: Mild LVH, EF 40-45%, inferior and posterior HK, grade 1 diastolic dysfunction, moderate MAC, mild to moderate MR, mild LAE. She had been seen in the past by Dr. Lauree Chandler for a left foot wound. Angiogram demonstrated chronic occlusion of the left SFA with diffuse below the knee disease.  [facility] Dr Bridgett Larsson wanted to do left fem pop but patient has been reluctant.    Ulcer has healed.  Has back problems ? Sciatica on right.  Trying walking program.  Offerred her PV f//u with Dr Fletcher Anon since surgery no longer being contemplated And she use to see CM.  Should probably have ABI's in 6 months    ROS: Denies fever, malais, weight loss, blurry vision, decreased visual acuity, cough, sputum, SOB, hemoptysis, pleuritic pain, palpitaitons, heartburn, abdominal pain, melena, lower extremity edema, claudication, or rash.  All other systems reviewed and negative  General: Affect appropriate Healthy:  appears stated age 66: normal Neck supple with no adenopathy JVP normal no bruits no thyromegaly Lungs clear with no wheezing and good diaphragmatic motion Heart:  S1/S2 no murmur, no rub, gallop or click PMI normal Abdomen: benighn, BS positve, no tenderness, no AAA no bruit.  No HSM or HJR Distal pulses plus 1 left PT/DP with healed foot ulcer  No edema Neuro non-focal Skin warm and dry No muscular weakness   Current Outpatient Prescriptions  Medication Sig Dispense Refill  . allopurinol (ZYLOPRIM) 100 MG tablet Take 100 mg by mouth 2 (two) times daily.      . Alpha-Lipoic Acid 600 MG CAPS Take 600  mg by mouth 2 (two) times daily.      Marland Kitchen aspirin 325 MG tablet Take 325 mg by mouth at bedtime.       . carvedilol (COREG) 12.5 MG tablet Take 1 tablet (12.5 mg total) by mouth 2 (two) times daily with a meal.  180 tablet  0  . cholecalciferol (VITAMIN D) 1000 UNITS tablet Take 2,000 Units by mouth daily.      . Cinnamon 500 MG capsule Take 1,000 mg by mouth 2 (two) times daily.       Marland Kitchen COLCRYS 0.6 MG tablet Take 0.6 mg by mouth daily as needed (for gout flareups).       . fish oil-omega-3 fatty acids 1000 MG capsule Take 1 g by mouth 2 (two) times daily.       Marland Kitchen glimepiride (AMARYL) 4 MG tablet Take 4 mg by mouth 2 (two) times daily.       . hydrochlorothiazide (MICROZIDE) 12.5 MG capsule Take 1 capsule (12.5 mg total) by mouth daily.      . insulin glargine (LANTUS) 100 UNIT/ML injection Inject 30-35 Units into the skin every morning.       . metFORMIN (GLUCOPHAGE) 1000 MG tablet Take 1,000 mg by mouth 2 (two) times daily with a meal.      . rosuvastatin (CRESTOR) 20 MG tablet Take 20 mg by mouth daily.      . valsartan (DIOVAN) 320 MG tablet Take 320 mg by mouth daily.       No  current facility-administered medications for this visit.    Allergies  Atenolol; Latex; and Omnipaque  Electrocardiogram:  NSR rate 85 LBBB LAD   Assessment and Plan

## 2013-03-10 NOTE — Assessment & Plan Note (Signed)
Left ABI was .70 in 2013  She is still not likley out of the woods regarding recurrent ulcers.  Symptoms are stable.  Consider pletal in future  Would like her to f/u with Dr Fletcher Anon She will call if she wants to.  Discussed foot care and proper fitting shoes

## 2013-03-10 NOTE — Assessment & Plan Note (Signed)
Well controlled.  Continue current medications and low sodium Dash type diet.    

## 2013-03-10 NOTE — Assessment & Plan Note (Signed)
Cholesterol is at goal.  Continue current dose of statin and diet Rx.  No myalgias or side effects.  F/U  LFT's in 6 months. Lab Results  Component Value Date   LDLCALC  Value: 115        Total Cholesterol/HDL:CHD Risk Coronary Heart Disease Risk Table                     Men   Women  1/2 Average Risk   3.4   3.3  Average Risk       5.0   4.4  2 X Average Risk   9.6   7.1  3 X Average Risk  23.4   11.0        Use the calculated Patient Ratio above and the CHD Risk Table to determine the patient's CHD Risk.        ATP III CLASSIFICATION (LDL):  <100     mg/dL   Optimal  100-129  mg/dL   Near or Above                    Optimal  130-159  mg/dL   Borderline  160-189  mg/dL   High  >190     mg/dL   Very High* 05/25/2009

## 2013-03-11 ENCOUNTER — Telehealth: Payer: Self-pay | Admitting: Cardiovascular Disease

## 2013-03-11 ENCOUNTER — Ambulatory Visit (INDEPENDENT_AMBULATORY_CARE_PROVIDER_SITE_OTHER): Payer: Medicare Other | Admitting: General Surgery

## 2013-03-11 ENCOUNTER — Telehealth (INDEPENDENT_AMBULATORY_CARE_PROVIDER_SITE_OTHER): Payer: Self-pay

## 2013-03-11 ENCOUNTER — Encounter (INDEPENDENT_AMBULATORY_CARE_PROVIDER_SITE_OTHER): Payer: Self-pay | Admitting: General Surgery

## 2013-03-11 VITALS — BP 146/84 | HR 96 | Temp 97.5°F | Resp 18 | Ht 64.5 in | Wt 181.0 lb

## 2013-03-11 DIAGNOSIS — C50219 Malignant neoplasm of upper-inner quadrant of unspecified female breast: Secondary | ICD-10-CM

## 2013-03-11 DIAGNOSIS — C50212 Malignant neoplasm of upper-inner quadrant of left female breast: Secondary | ICD-10-CM

## 2013-03-11 NOTE — Progress Notes (Signed)
Subjective:     Patient ID: [name], female   DOB: 1945-07-28, 67 y.o.   MRN: [id number]  HPI This is a 67 year old female with a family history of breast cancer who I initially saw in a multidisciplinary clinic.She now knows that her sister who was tested is brca negative.  Her genetic testing is pending still.  She has been cleared by cardiology. She returns to discuss surgery now.  Her her2 is negative.  Review of Systems     Objective:   Physical Exam deferred    Assessment:     Stage I left breast cancer     Plan:     Left breast wire guided lumpectomy, left axillary sentinel node biopsy   We discussed a sentinel lymph node biopsy as she does not appear to having lymph node involvement right now. We discussed the performance of that with injection of radioactive tracer and possibly blue dye. We discussed that she would have an incision underneath her axillary hairline. We discussed that there is a bout a 10-20% chance of having a positive node with a sentinel lymph node biopsy and we will await the permanent pathology to make any other first further decisions in terms of her treatment. One of these options might be to return to the operating room to perform an axillary lymph node dissection. We discussed about a 1-2% risk lifetime of chronic shoulder pain as well as lymphedema associated with a sentinel lymph node biopsy.  We discussed the options for treatment of the breast cancer which included lumpectomy versus a mastectomy. We discussed the performance of the lumpectomy with a wire placement. We discussed a 5% chance of a positive margin requiring reexcision in the operating room. We are going to proceed with lumpectomy We discussed the risks of operation including bleeding, infection, possible reoperation. She understands her further therapy will be based on what her stages at the time of her operation.

## 2013-03-11 NOTE — Telephone Encounter (Signed)
NEEDS  LUMPECTOMY   MAY PT PROCEED WITH PROCEDURE WILL FORWARD TO DR NISHAN./CY

## 2013-03-11 NOTE — Telephone Encounter (Signed)
Ok to have surgery

## 2013-03-11 NOTE — Telephone Encounter (Signed)
New Problem:  Alecia from [facility] is requesting a Cardiac Clearance note from Dr. Johnsie Cancel for the pt to have breast surgery this month.  Alecia states the note could be put in Epic as [facility] is on Epic.

## 2013-03-11 NOTE — Telephone Encounter (Signed)
LM requesting note in epic or fax to be sent to [name] stating pt ok for surgery for cardiac clearance. The pt has been scheduled for sx on 12/19 by Dr Donne Hazel.

## 2013-03-14 ENCOUNTER — Encounter: Payer: Self-pay | Admitting: Genetic Counselor

## 2013-03-14 ENCOUNTER — Telehealth: Payer: Self-pay | Admitting: Genetic Counselor

## 2013-03-14 NOTE — Telephone Encounter (Signed)
WILL FORWARD TO DR  [facility] OFFICE .Adonis Housekeeper

## 2013-03-14 NOTE — Telephone Encounter (Signed)
Called to notify Dr Johnsie Cancel did ok pt for surgery and note is in epic.

## 2013-03-14 NOTE — Telephone Encounter (Signed)
Revealed negative genetic test results but has a PTEN VUS.

## 2013-03-18 ENCOUNTER — Ambulatory Visit: Payer: Medicare Other | Admitting: Cardiovascular Disease

## 2013-03-18 ENCOUNTER — Other Ambulatory Visit (INDEPENDENT_AMBULATORY_CARE_PROVIDER_SITE_OTHER): Payer: Self-pay | Admitting: General Surgery

## 2013-03-18 DIAGNOSIS — C50212 Malignant neoplasm of upper-inner quadrant of left female breast: Secondary | ICD-10-CM

## 2013-04-05 ENCOUNTER — Encounter (HOSPITAL_COMMUNITY): Payer: Self-pay

## 2013-04-11 NOTE — Pre-Procedure Instructions (Signed)
[name]  04/11/2013   Your procedure is scheduled on: Wednesday, Jan. 14th             [facility] Parking is available)  [facility] Main Entrance "A" at 9:00 AM.   Call this number if you have problems the morning of surgery: [phone]   Remember:   Do not eat food or drink liquids after midnight Tuesday.   Take these medicines the morning of surgery with A SIP OF WATER: Coreg  Stop all Aspirin , Vitamins, Herbal medication, Nsaids 5 days prior to surgery.   Do not wear jewelry, make-up or nail polish.  Do not wear lotions, powders, or perfumes. You may NOT wear deodorant.  Do not shave underarms & legs 48 hours prior to surgery.               [facility] is not responsible for any belongings or valuables.               Contacts, dentures or bridgework may not be worn into surgery.  Leave suitcase in the car. After surgery it may be brought to your room.  For patients admitted to the hospital, discharge time is determined by your treatment team.               Patients discharged the day of surgery will not be allowed to drive home.   Name and phone number of your driver:    Special Instructions: Shower using CHG 2 nights before surgery and the night before surgery.  If you shower the day of surgery use CHG.  Use special wash - you have one bottle of CHG for all showers.  You should use approximately 1/3 of the bottle for each shower.   Please read over the following fact sheets that you were given: Pain Booklet and Surgical Site Infection Prevention

## 2013-04-12 ENCOUNTER — Encounter (HOSPITAL_COMMUNITY)
Admission: RE | Admit: 2013-04-12 | Discharge: 2013-04-12 | Disposition: A | Discharge status: Home / Self Care | Payer: Medicare Other | Source: Ambulatory Visit | Attending: General Surgery | Admitting: General Surgery

## 2013-04-12 ENCOUNTER — Ambulatory Visit (HOSPITAL_COMMUNITY): Payer: Medicare Other

## 2013-04-12 ENCOUNTER — Encounter (HOSPITAL_COMMUNITY)
Admission: RE | Admit: 2013-04-12 | Discharge: 2013-04-12 | Disposition: A | Discharge status: Home / Self Care | Payer: Medicare Other | Source: Ambulatory Visit | Attending: Anesthesiology | Admitting: Anesthesiology

## 2013-04-12 ENCOUNTER — Encounter (HOSPITAL_COMMUNITY): Payer: Self-pay

## 2013-04-12 DIAGNOSIS — Z01812 Encounter for preprocedural laboratory examination: Secondary | ICD-10-CM | POA: Insufficient documentation

## 2013-04-12 DIAGNOSIS — Z01818 Encounter for other preprocedural examination: Secondary | ICD-10-CM | POA: Insufficient documentation

## 2013-04-12 LAB — CBC WITH DIFFERENTIAL/PLATELET
BASOS ABS: 0 10*3/uL (ref 0.0–0.1)
Basophils Relative: 0 % (ref 0–1)
EOS ABS: 0.3 10*3/uL (ref 0.0–0.7)
EOS PCT: 3 % (ref 0–5)
HCT: 40.1 % (ref 36.0–46.0)
Hemoglobin: 13.4 g/dL (ref 12.0–15.0)
LYMPHS PCT: 25 % (ref 12–46)
Lymphs Abs: 2.2 10*3/uL (ref 0.7–4.0)
MCH: 29.4 pg (ref 26.0–34.0)
MCHC: 33.4 g/dL (ref 30.0–36.0)
MCV: 87.9 fL (ref 78.0–100.0)
Monocytes Absolute: 0.8 10*3/uL (ref 0.1–1.0)
Monocytes Relative: 9 % (ref 3–12)
Neutro Abs: 5.6 10*3/uL (ref 1.7–7.7)
Neutrophils Relative %: 63 % (ref 43–77)
PLATELETS: 217 10*3/uL (ref 150–400)
RBC: 4.56 MIL/uL (ref 3.87–5.11)
RDW: 14.6 % (ref 11.5–15.5)
WBC: 8.9 10*3/uL (ref 4.0–10.5)

## 2013-04-12 LAB — BASIC METABOLIC PANEL
BUN: 42 mg/dL — ABNORMAL HIGH (ref 6–23)
CALCIUM: 9.5 mg/dL (ref 8.4–10.5)
CO2: 26 meq/L (ref 19–32)
CREATININE: 1.46 mg/dL — AB (ref 0.50–1.10)
Chloride: 100 mEq/L (ref 96–112)
GFR calc Af Amer: 42 mL/min — ABNORMAL LOW (ref >90)
GFR, EST NON AFRICAN AMERICAN: 36 mL/min — AB (ref >90)
Glucose, Bld: 203 mg/dL — ABNORMAL HIGH (ref 70–99)
Potassium: 4.3 mEq/L (ref 3.7–5.3)
SODIUM: 139 meq/L (ref 137–147)

## 2013-04-14 NOTE — Progress Notes (Signed)
Anesthesia Chart Review:  Patient is a 68 year old female scheduled for left breast wire guided lumpectomy and axillary SN biopsy on 04/20/13 by Dr. Donne Hazel.  History includes breast cancer, non-smoker, HTN, CAD/NSTEMI s/p CABG (LIMA to LAD, SVG to DIAG, SVG to OM1/2) '11, HLD, ischemic CM, known left BBB, PAD, DM2 with neuropathy, CKD stage I, arthritis.  Cardiologist is Dr. Johnsie Cancel who felt patient was okay for this procedure.   EKG on 03/10/13 showed NSR, LAD, left BBB.  Echo on 07/12/09 showed: Normal LV size with mild LV hypertrophy. Mild to moderate systolic dysfunction, EF [id number]. Inferior and posterior hypokinesis. Mild to moderate mitral regurgitation. Mild left atrial enlargement. Normal RV size and systolic function.  Her last cath was pre-CABG in 2011.  Preoperative CXR and labs noted.  She will get a fasting CBG on arrival.  She has been cleared by her cardiologist.  If no acute changes then I would anticipate that she could proceed as planned.  [facility] Short Stay Center/Anesthesiology Phone [phone] 04/14/2013 1:18 PM

## 2013-04-19 MED ORDER — CEFAZOLIN SODIUM-DEXTROSE 2-3 GM-% IV SOLR
2.0000 g | INTRAVENOUS | Status: AC
Start: 2013-04-20 — End: 2013-04-20
  Administered 2013-04-20: 2 g via INTRAVENOUS
  Filled 2013-04-19: qty 50

## 2013-04-20 ENCOUNTER — Ambulatory Visit (HOSPITAL_COMMUNITY): Payer: Medicare Other | Admitting: Certified Registered Nurse Anesthetist

## 2013-04-20 ENCOUNTER — Encounter (HOSPITAL_COMMUNITY)
Admission: RE | Disposition: A | Discharge status: Home / Self Care | Payer: Self-pay | Source: Ambulatory Visit | Attending: General Surgery

## 2013-04-20 ENCOUNTER — Encounter (HOSPITAL_COMMUNITY): Payer: Medicare Other | Admitting: Vascular Surgery

## 2013-04-20 ENCOUNTER — Ambulatory Visit
Admission: RE | Admit: 2013-04-20 | Discharge: 2013-04-20 | Disposition: A | Discharge status: Home / Self Care | Payer: Medicare Other | Source: Ambulatory Visit | Attending: General Surgery | Admitting: General Surgery

## 2013-04-20 ENCOUNTER — Encounter (HOSPITAL_COMMUNITY)
Admission: RE | Admit: 2013-04-20 | Discharge: 2013-04-20 | Disposition: A | Discharge status: Home / Self Care | Payer: Medicare Other | Source: Ambulatory Visit | Attending: General Surgery | Admitting: General Surgery

## 2013-04-20 ENCOUNTER — Encounter (HOSPITAL_COMMUNITY): Payer: Self-pay | Admitting: Certified Registered Nurse Anesthetist

## 2013-04-20 ENCOUNTER — Observation Stay (HOSPITAL_COMMUNITY)
Admission: RE | Admit: 2013-04-20 | Discharge: 2013-04-21 | Disposition: A | Discharge status: Home / Self Care | Payer: Medicare Other | Source: Ambulatory Visit | Attending: General Surgery | Admitting: General Surgery

## 2013-04-20 DIAGNOSIS — I129 Hypertensive chronic kidney disease with stage 1 through stage 4 chronic kidney disease, or unspecified chronic kidney disease: Secondary | ICD-10-CM | POA: Diagnosis not present

## 2013-04-20 DIAGNOSIS — I2510 Atherosclerotic heart disease of native coronary artery without angina pectoris: Secondary | ICD-10-CM | POA: Insufficient documentation

## 2013-04-20 DIAGNOSIS — I252 Old myocardial infarction: Secondary | ICD-10-CM | POA: Insufficient documentation

## 2013-04-20 DIAGNOSIS — I2589 Other forms of chronic ischemic heart disease: Secondary | ICD-10-CM | POA: Insufficient documentation

## 2013-04-20 DIAGNOSIS — Z794 Long term (current) use of insulin: Secondary | ICD-10-CM

## 2013-04-20 DIAGNOSIS — C50919 Malignant neoplasm of unspecified site of unspecified female breast: Secondary | ICD-10-CM | POA: Insufficient documentation

## 2013-04-20 DIAGNOSIS — I509 Heart failure, unspecified: Secondary | ICD-10-CM | POA: Insufficient documentation

## 2013-04-20 DIAGNOSIS — E119 Type 2 diabetes mellitus without complications: Secondary | ICD-10-CM

## 2013-04-20 DIAGNOSIS — D0590 Unspecified type of carcinoma in situ of unspecified breast: Secondary | ICD-10-CM | POA: Insufficient documentation

## 2013-04-20 DIAGNOSIS — I10 Essential (primary) hypertension: Secondary | ICD-10-CM | POA: Insufficient documentation

## 2013-04-20 DIAGNOSIS — Z7982 Long term (current) use of aspirin: Secondary | ICD-10-CM | POA: Insufficient documentation

## 2013-04-20 DIAGNOSIS — J4489 Other specified chronic obstructive pulmonary disease: Secondary | ICD-10-CM | POA: Insufficient documentation

## 2013-04-20 DIAGNOSIS — I739 Peripheral vascular disease, unspecified: Secondary | ICD-10-CM | POA: Diagnosis not present

## 2013-04-20 DIAGNOSIS — M109 Gout, unspecified: Secondary | ICD-10-CM | POA: Insufficient documentation

## 2013-04-20 DIAGNOSIS — J449 Chronic obstructive pulmonary disease, unspecified: Secondary | ICD-10-CM | POA: Insufficient documentation

## 2013-04-20 DIAGNOSIS — C50212 Malignant neoplasm of upper-inner quadrant of left female breast: Secondary | ICD-10-CM

## 2013-04-20 DIAGNOSIS — E785 Hyperlipidemia, unspecified: Secondary | ICD-10-CM | POA: Insufficient documentation

## 2013-04-20 DIAGNOSIS — S98139A Complete traumatic amputation of one unspecified lesser toe, initial encounter: Secondary | ICD-10-CM | POA: Diagnosis not present

## 2013-04-20 DIAGNOSIS — Z951 Presence of aortocoronary bypass graft: Secondary | ICD-10-CM | POA: Insufficient documentation

## 2013-04-20 DIAGNOSIS — N181 Chronic kidney disease, stage 1: Secondary | ICD-10-CM | POA: Diagnosis not present

## 2013-04-20 DIAGNOSIS — G5790 Unspecified mononeuropathy of unspecified lower limb: Secondary | ICD-10-CM | POA: Diagnosis not present

## 2013-04-20 HISTORY — PX: BREAST LUMPECTOMY: SHX2

## 2013-04-20 HISTORY — PX: BREAST LUMPECTOMY WITH NEEDLE LOCALIZATION AND AXILLARY SENTINEL LYMPH NODE BX: SHX5760

## 2013-04-20 LAB — GLUCOSE, CAPILLARY
Glucose-Capillary: 239 mg/dL — ABNORMAL HIGH (ref 70–99)
Glucose-Capillary: 292 mg/dL — ABNORMAL HIGH (ref 70–99)

## 2013-04-20 SURGERY — BREAST LUMPECTOMY WITH NEEDLE LOCALIZATION AND AXILLARY SENTINEL LYMPH NODE BX
Anesthesia: General | Site: Breast | Laterality: Left

## 2013-04-20 MED ORDER — TECHNETIUM TC 99M SULFUR COLLOID FILTERED: 1.0000 | Freq: Once | INTRAVENOUS | Status: AC | PRN

## 2013-04-20 MED ORDER — COLCHICINE 0.6 MG PO TABS: 0.6000 mg | ORAL_TABLET | Freq: Every day | ORAL | Status: DC | PRN

## 2013-04-20 MED ORDER — ONDANSETRON HCL 4 MG/2ML IJ SOLN
4.0000 mg | Freq: Four times a day (QID) | INTRAMUSCULAR | Status: DC | PRN
  Administered 2013-04-20: 4 mg via INTRAVENOUS
  Filled 2013-04-20: qty 2

## 2013-04-20 MED ORDER — OXYCODONE HCL 5 MG PO TABS: 5.0000 mg | ORAL_TABLET | Freq: Once | ORAL | Status: DC | PRN

## 2013-04-20 MED ORDER — METHYLENE BLUE 1 % INJ SOLN
INTRAMUSCULAR | Status: AC
  Filled 2013-04-20: qty 10

## 2013-04-20 MED ORDER — ACETAMINOPHEN 325 MG PO TABS: 650.0000 mg | ORAL_TABLET | Freq: Four times a day (QID) | ORAL | Status: DC | PRN

## 2013-04-20 MED ORDER — MORPHINE SULFATE 2 MG/ML IJ SOLN: 2.0000 mg | INTRAMUSCULAR | Status: DC | PRN

## 2013-04-20 MED ORDER — PROPOFOL 10 MG/ML IV BOLUS
INTRAVENOUS | Status: DC | PRN
  Administered 2013-04-20: 150 mg via INTRAVENOUS

## 2013-04-20 MED ORDER — OXYCODONE HCL 5 MG PO TABS
5.0000 mg | ORAL_TABLET | ORAL | Status: DC | PRN
  Administered 2013-04-20 – 2013-04-21 (×3): 5 mg via ORAL
  Filled 2013-04-20 (×2): qty 1

## 2013-04-20 MED ORDER — MIDAZOLAM HCL 2 MG/2ML IJ SOLN
INTRAMUSCULAR | Status: AC
  Administered 2013-04-20: 2 mg via INTRAVENOUS
  Filled 2013-04-20: qty 2

## 2013-04-20 MED ORDER — IRBESARTAN 75 MG PO TABS
75.0000 mg | ORAL_TABLET | Freq: Every day | ORAL | Status: DC
  Administered 2013-04-20 – 2013-04-21 (×2): 75 mg via ORAL
  Filled 2013-04-20 (×2): qty 1

## 2013-04-20 MED ORDER — OXYCODONE HCL 5 MG/5ML PO SOLN: 5.0000 mg | Freq: Once | ORAL | Status: DC | PRN

## 2013-04-20 MED ORDER — INSULIN ASPART 100 UNIT/ML ~~LOC~~ SOLN
0.0000 [IU] | Freq: Every day | SUBCUTANEOUS | Status: DC
  Administered 2013-04-20: 3 [IU] via SUBCUTANEOUS

## 2013-04-20 MED ORDER — ONDANSETRON HCL 4 MG/2ML IJ SOLN
INTRAMUSCULAR | Status: DC | PRN
  Administered 2013-04-20 (×2): 4 mg via INTRAVENOUS

## 2013-04-20 MED ORDER — LACTATED RINGERS IV SOLN
INTRAVENOUS | Status: DC
  Administered 2013-04-20: 11:00:00 via INTRAVENOUS

## 2013-04-20 MED ORDER — CARVEDILOL 12.5 MG PO TABS
12.5000 mg | ORAL_TABLET | Freq: Two times a day (BID) | ORAL | Status: DC
  Administered 2013-04-20 – 2013-04-21 (×2): 12.5 mg via ORAL
  Filled 2013-04-20 (×4): qty 1

## 2013-04-20 MED ORDER — HYDROCHLOROTHIAZIDE 25 MG PO TABS: 12.5000 mg | ORAL_TABLET | Freq: Every day | ORAL | Status: DC

## 2013-04-20 MED ORDER — BUPIVACAINE-EPINEPHRINE PF 0.25-1:200000 % IJ SOLN
INTRAMUSCULAR | Status: AC
  Filled 2013-04-20: qty 30

## 2013-04-20 MED ORDER — LACTATED RINGERS IV SOLN
INTRAVENOUS | Status: DC | PRN
  Administered 2013-04-20 (×2): via INTRAVENOUS

## 2013-04-20 MED ORDER — FENTANYL CITRATE 0.05 MG/ML IJ SOLN
INTRAMUSCULAR | Status: DC | PRN
  Administered 2013-04-20 (×3): 50 ug via INTRAVENOUS

## 2013-04-20 MED ORDER — HYDROMORPHONE HCL PF 1 MG/ML IJ SOLN
INTRAMUSCULAR | Status: AC
  Administered 2013-04-20: 0.25 mg via INTRAVENOUS
  Filled 2013-04-20: qty 1

## 2013-04-20 MED ORDER — INSULIN ASPART 100 UNIT/ML ~~LOC~~ SOLN
0.0000 [IU] | Freq: Three times a day (TID) | SUBCUTANEOUS | Status: DC
Start: 2013-04-20 — End: 2013-04-21
  Administered 2013-04-20: 11 [IU] via SUBCUTANEOUS
  Administered 2013-04-21: 2 [IU] via SUBCUTANEOUS

## 2013-04-20 MED ORDER — 0.9 % SODIUM CHLORIDE (POUR BTL) OPTIME
TOPICAL | Status: DC | PRN
  Administered 2013-04-20: 1000 mL

## 2013-04-20 MED ORDER — INSULIN GLARGINE 100 UNIT/ML ~~LOC~~ SOLN
35.0000 [IU] | Freq: Every morning | SUBCUTANEOUS | Status: DC
  Administered 2013-04-21: 35 [IU] via SUBCUTANEOUS
  Filled 2013-04-20: qty 0.35

## 2013-04-20 MED ORDER — SODIUM CHLORIDE 0.9 % IJ SOLN
INTRAMUSCULAR | Status: DC | PRN
  Administered 2013-04-20: 12:00:00 via INTRADERMAL

## 2013-04-20 MED ORDER — FENTANYL CITRATE 0.05 MG/ML IJ SOLN
INTRAMUSCULAR | Status: AC
  Administered 2013-04-20: 100 ug via INTRAVENOUS
  Filled 2013-04-20: qty 2

## 2013-04-20 MED ORDER — SODIUM CHLORIDE 0.9 % IV SOLN
INTRAVENOUS | Status: DC
  Administered 2013-04-20 – 2013-04-21 (×2): via INTRAVENOUS

## 2013-04-20 MED ORDER — ALLOPURINOL 100 MG PO TABS
100.0000 mg | ORAL_TABLET | Freq: Two times a day (BID) | ORAL | Status: DC
  Administered 2013-04-20 – 2013-04-21 (×2): 100 mg via ORAL
  Filled 2013-04-20 (×3): qty 1

## 2013-04-20 MED ORDER — LIDOCAINE HCL (CARDIAC) 20 MG/ML IV SOLN
INTRAVENOUS | Status: DC | PRN
  Administered 2013-04-20: 100 mg via INTRAVENOUS

## 2013-04-20 MED ORDER — HYDROMORPHONE HCL PF 1 MG/ML IJ SOLN
0.2500 mg | INTRAMUSCULAR | Status: DC | PRN
  Administered 2013-04-20: 0.5 mg via INTRAVENOUS
  Administered 2013-04-20 (×2): 0.25 mg via INTRAVENOUS

## 2013-04-20 MED ORDER — SODIUM CHLORIDE 0.9 % IJ SOLN
INTRAMUSCULAR | Status: AC
  Filled 2013-04-20: qty 10

## 2013-04-20 MED ORDER — BUPIVACAINE-EPINEPHRINE 0.25% -1:200000 IJ SOLN
INTRAMUSCULAR | Status: DC | PRN
  Administered 2013-04-20: 20 mL

## 2013-04-20 MED ORDER — ACETAMINOPHEN 650 MG RE SUPP: 650.0000 mg | Freq: Four times a day (QID) | RECTAL | Status: DC | PRN

## 2013-04-20 MED ORDER — PROMETHAZINE HCL 25 MG/ML IJ SOLN
6.2500 mg | INTRAMUSCULAR | Status: DC | PRN
  Administered 2013-04-20: 6.25 mg via INTRAVENOUS

## 2013-04-20 MED ORDER — PROMETHAZINE HCL 25 MG/ML IJ SOLN
INTRAMUSCULAR | Status: AC
  Filled 2013-04-20: qty 1

## 2013-04-20 MED ORDER — HYDROCHLOROTHIAZIDE 12.5 MG PO CAPS
12.5000 mg | ORAL_CAPSULE | Freq: Every day | ORAL | Status: DC
  Administered 2013-04-20 – 2013-04-21 (×2): 12.5 mg via ORAL
  Filled 2013-04-20 (×2): qty 1

## 2013-04-20 SURGICAL SUPPLY — 64 items
ADH SKN CLS APL DERMABOND .7 (GAUZE/BANDAGES/DRESSINGS) ×1
APL SKNCLS STERI-STRIP NONHPOA (GAUZE/BANDAGES/DRESSINGS) ×1
APPLIER CLIP 9.375 MED OPEN (MISCELLANEOUS) ×3
APR CLP MED 9.3 20 MLT OPN (MISCELLANEOUS) ×1
BENZOIN TINCTURE PRP APPL 2/3 (GAUZE/BANDAGES/DRESSINGS) ×2 IMPLANT
BINDER BREAST LRG (GAUZE/BANDAGES/DRESSINGS) IMPLANT
BINDER BREAST XLRG (GAUZE/BANDAGES/DRESSINGS) ×2 IMPLANT
BLADE SURG 10 STRL SS (BLADE) ×3 IMPLANT
BLADE SURG 15 STRL LF DISP TIS (BLADE) ×1 IMPLANT
BLADE SURG 15 STRL SS (BLADE) ×3
CANISTER SUCTION 2500CC (MISCELLANEOUS) ×3 IMPLANT
CHLORAPREP W/TINT 26ML (MISCELLANEOUS) ×3 IMPLANT
CLIP APPLIE 9.375 MED OPEN (MISCELLANEOUS) ×1 IMPLANT
CLOSURE STERI-STRIP 1/2X4 (GAUZE/BANDAGES/DRESSINGS) ×1
CLOSURE WOUND 1/2 X4 (GAUZE/BANDAGES/DRESSINGS)
CLSR STERI-STRIP ANTIMIC 1/2X4 (GAUZE/BANDAGES/DRESSINGS) ×1 IMPLANT
CONT SPEC 4OZ CLIKSEAL STRL BL (MISCELLANEOUS) ×4 IMPLANT
CONT SPEC STER OR (MISCELLANEOUS) ×3 IMPLANT
COVER PROBE W GEL 5X96 (DRAPES) ×3 IMPLANT
COVER SURGICAL LIGHT HANDLE (MISCELLANEOUS) ×3 IMPLANT
DERMABOND ADVANCED (GAUZE/BANDAGES/DRESSINGS) ×2
DERMABOND ADVANCED .7 DNX12 (GAUZE/BANDAGES/DRESSINGS) IMPLANT
DEVICE DUBIN SPECIMEN MAMMOGRA (MISCELLANEOUS) ×3 IMPLANT
DRAPE CHEST BREAST 15X10 FENES (DRAPES) ×3 IMPLANT
DRAPE UTILITY 15X26 W/TAPE STR (DRAPE) ×4 IMPLANT
DRSG PAD ABDOMINAL 8X10 ST (GAUZE/BANDAGES/DRESSINGS) IMPLANT
DRSG TEGADERM 2-3/8X2-3/4 SM (GAUZE/BANDAGES/DRESSINGS) ×2 IMPLANT
DRSG TEGADERM 4X4.75 (GAUZE/BANDAGES/DRESSINGS) IMPLANT
ELECT CAUTERY BLADE 6.4 (BLADE) ×3 IMPLANT
ELECT REM PT RETURN 9FT ADLT (ELECTROSURGICAL) ×3
ELECTRODE REM PT RTRN 9FT ADLT (ELECTROSURGICAL) ×1 IMPLANT
GAUZE SPONGE 2X2 8PLY STRL LF (GAUZE/BANDAGES/DRESSINGS) IMPLANT
GLOVE BIO SURGEON STRL SZ7 (GLOVE) ×3 IMPLANT
GLOVE BIOGEL PI IND STRL 7.5 (GLOVE) ×1 IMPLANT
GLOVE BIOGEL PI INDICATOR 7.5 (GLOVE) ×2
GOWN STRL NON-REIN LRG LVL3 (GOWN DISPOSABLE) ×6 IMPLANT
KIT BASIN OR (CUSTOM PROCEDURE TRAY) ×3 IMPLANT
KIT MARKER MARGIN INK (KITS) ×2 IMPLANT
KIT ROOM TURNOVER OR (KITS) ×3 IMPLANT
NDL 18GX1X1/2 (RX/OR ONLY) (NEEDLE) IMPLANT
NDL HYPO 25GX1X1/2 BEV (NEEDLE) ×1 IMPLANT
NEEDLE 18GX1X1/2 (RX/OR ONLY) (NEEDLE) IMPLANT
NEEDLE HYPO 25GX1X1/2 BEV (NEEDLE) ×3 IMPLANT
NS IRRIG 1000ML POUR BTL (IV SOLUTION) ×3 IMPLANT
PACK SURGICAL SETUP 50X90 (CUSTOM PROCEDURE TRAY) ×3 IMPLANT
PAD ARMBOARD 7.5X6 YLW CONV (MISCELLANEOUS) ×3 IMPLANT
PENCIL BUTTON HOLSTER BLD 10FT (ELECTRODE) ×3 IMPLANT
SPONGE GAUZE 2X2 STER 10/PKG (GAUZE/BANDAGES/DRESSINGS) ×2
SPONGE GAUZE 4X4 12PLY (GAUZE/BANDAGES/DRESSINGS) IMPLANT
SPONGE LAP 18X18 X RAY DECT (DISPOSABLE) ×3 IMPLANT
STAPLER VISISTAT 35W (STAPLE) ×3 IMPLANT
STRIP CLOSURE SKIN 1/2X4 (GAUZE/BANDAGES/DRESSINGS) IMPLANT
SUT MNCRL AB 4-0 PS2 18 (SUTURE) ×6 IMPLANT
SUT SILK 2 0 SH (SUTURE) IMPLANT
SUT VIC AB 2-0 SH 27 (SUTURE) ×18
SUT VIC AB 2-0 SH 27XBRD (SUTURE) ×2 IMPLANT
SUT VIC AB 3-0 SH 27 (SUTURE) ×6
SUT VIC AB 3-0 SH 27XBRD (SUTURE) ×2 IMPLANT
SYR CONTROL 10ML LL (SYRINGE) ×5 IMPLANT
TOWEL OR 17X24 6PK STRL BLUE (TOWEL DISPOSABLE) ×3 IMPLANT
TOWEL OR 17X26 10 PK STRL BLUE (TOWEL DISPOSABLE) ×3 IMPLANT
TUBE CONNECTING 12'X1/4 (SUCTIONS) ×1
TUBE CONNECTING 12X1/4 (SUCTIONS) ×2 IMPLANT
YANKAUER SUCT BULB TIP NO VENT (SUCTIONS) ×3 IMPLANT

## 2013-04-20 NOTE — H&P (Signed)
58 yof with no prior breast history who presents after undergoing screening mm that shows a 73mm nodule in the left breast that was indeterminate. Breast composition category B. Ultrasound was negative. She underwent stereotactic biopsy that shows an invasive ductal carcinoma, dcis with calcifications, grade I, er pos at 100, pr pos at 100, Ki 8%. Her 2 negative.  Genetic testing was negative. She has been evaluated by cards and we will proceed.  No interval change.  Past Medical History   Diagnosis  Date   .  Hypertension    .  Coronary artery disease      NSTEMI in 05/2009; LHC with 2 v CAD - > CABG (LIMA-LAD, SVG-diagonal, SVG-OM1/OM 2).   .  Hyperlipidemia    .  Ischemic cardiomyopathy      Echo 07/2009: Mild LVH, EF 40-45%, inferior and posterior HK, grade 1 diastolic dysfunction, moderate MAC, mild to moderate MR, mild LAE   .  PAD (peripheral artery disease)    .  Family history of anesthesia complication      Mom has a hard time to wake up   .  Neuropathy      Hx; of B/L feet   .  Type II diabetes mellitus    .  Foot ulcer      "I've had them on both feet" (08/27/2012)   .  Osteomyelitis of foot    .  Sinus headache      "occasionally" (08/27/2012)   .  Arthritis      "maybe in my lower back" (08/27/2012)   .  Gouty arthritis      "right index finger" (08/27/2012)   .  CKD (chronic kidney disease), stage I    .  NSTEMI (non-ST elevated myocardial infarction)  05/23/2009     Archie Endo 05/23/2009 (08/27/2012)   .  Breast cancer     Past Surgical History   Procedure  Laterality  Date   .  Cataract extraction w/ intraocular lens implant, bilateral   2000's   .  Finger surgery  Right      "index finger; turned out to be gout" (08/27/2012)   .  Coronary artery bypass graft   2011     "CABG X4" (08/27/2012)   .  Dental surgery   04/30/11     "1 implant" (08/27/2012)   .  Amputation ray  Right  08/27/2012     5th ray/notes 08/27/2012   .  Cardiac catheterization   05/24/2009     Archie Endo  05/24/2009 (08/27/2012)   .  Amputation  Right  08/27/2012     Procedure: AMPUTATION RAY; Surgeon: Newt Minion, MD; Location: [facility]; Service: Orthopedics; Laterality: Right; Right Foot 5th Ray Amputation    Family History   Problem  Relation  Age of Onset   .  Hypertension  Brother    .  Kidney cancer  Brother    .  Breast cancer  Brother    .  Cancer  Sister    .  Cancer  Brother    .  Throat cancer  Father    .  Breast cancer  Maternal Aunt    Social History  History   Substance Use Topics   .  Smoking status:  Never Smoker   .  Smokeless tobacco:  Never Used   .  Alcohol Use:  No    Allergies   Allergen  Reactions   .  Atenolol  Other (See Comments)  Exacerbates gout   .  Latex  Rash   .  Omnipaque [Iohexol]  Rash    Current Outpatient Prescriptions   Medication  Sig  Dispense  Refill   .  allopurinol (ZYLOPRIM) 100 MG tablet  Take 100 mg by mouth 2 (two) times daily.     .  Alpha-Lipoic Acid 600 MG CAPS  Take 600 mg by mouth 2 (two) times daily.     Marland Kitchen  aspirin 325 MG tablet  Take 325 mg by mouth at bedtime.     .  carvedilol (COREG) 12.5 MG tablet  Take 1 tablet (12.5 mg total) by mouth 2 (two) times daily with a meal.  180 tablet  0   .  cholecalciferol (VITAMIN D) 1000 UNITS tablet  Take 2,000 Units by mouth daily.     .  Cinnamon 500 MG capsule  Take 1,000 mg by mouth 2 (two) times daily.     Marland Kitchen  COLCRYS 0.6 MG tablet  Take 0.6 mg by mouth daily as needed (for gout flareups).     .  fish oil-omega-3 fatty acids 1000 MG capsule  Take 1 g by mouth 2 (two) times daily.     Marland Kitchen  glimepiride (AMARYL) 4 MG tablet  Take 4 mg by mouth 2 (two) times daily.     .  hydrochlorothiazide (MICROZIDE) 12.5 MG capsule  Take 1 capsule (12.5 mg total) by mouth daily.     .  insulin glargine (LANTUS) 100 UNIT/ML injection  Inject 30-35 Units into the skin every morning.     .  metFORMIN (GLUCOPHAGE) 1000 MG tablet  Take 1,000 mg by mouth 2 (two) times daily with a meal.     .  rosuvastatin  (CRESTOR) 20 MG tablet  Take 20 mg by mouth daily.     .  valsartan (DIOVAN) 320 MG tablet  Take 320 mg by mouth daily.      No current facility-administered medications for this visit.   Review of Systems  Review of Systems  Constitutional: Positive for fatigue. Negative for fever, chills and unexpected weight change.  Respiratory: Negative for cough and wheezing.  Cardiovascular: Negative for chest pain, palpitations and leg swelling.    Physical Exam  Physical Exam  Vitals reviewed.  Constitutional: She appears well-developed and well-nourished.  Cardiovascular: Normal rate, regular rhythm and normal heart sounds.  Pulmonary/Chest: Effort normal and breath sounds normal. She has no wheezes. She has no rales. Right breast exhibits no inverted nipple, no mass, no nipple discharge, no skin change and no tenderness. Left breast exhibits no inverted nipple, no mass, no nipple discharge, no skin change and no tenderness.   Assessment  Clinical stage I breast cancer  Plan  Left breast wire guided lumpectomy, left axillary sentinel node biopsy We discussed the risks of operation including bleeding, infection, possible reoperation. She understands her further therapy will be based on what her stages at the time of her operation.

## 2013-04-20 NOTE — Anesthesia Preprocedure Evaluation (Signed)
Anesthesia Evaluation  Patient identified by MRN, date of birth, ID band Patient awake    Reviewed: Allergy & Precautions, H&P , NPO status , Patient's Chart, lab work & pertinent test results  History of Anesthesia Complications (+) Family history of anesthesia reactionNegative for: history of anesthetic complications  Airway       Dental   Pulmonary asthma , COPD         Cardiovascular hypertension, Pt. on home beta blockers + CAD, + Past MI, + Peripheral Vascular Disease and +CHF  Echo 07/2009: Mild LVH, EF 40-45%, inferior and posterior HK,    Neuro/Psych negative neurological ROS  negative psych ROS   GI/Hepatic negative GI ROS, Neg liver ROS,   Endo/Other  diabetes, Insulin Dependent and Oral Hypoglycemic Agents  Renal/GU negative Renal ROS     Musculoskeletal   Abdominal   Peds  Hematology   Anesthesia Other Findings   Reproductive/Obstetrics                           Anesthesia Physical Anesthesia Plan  ASA: III  Anesthesia Plan: General   Post-op Pain Management:    Induction: Intravenous  Airway Management Planned: LMA  Additional Equipment:   Intra-op Plan:   Post-operative Plan: Extubation in OR  Informed Consent:   Dental advisory given  Plan Discussed with: CRNA, Anesthesiologist and Surgeon  Anesthesia Plan Comments:         Anesthesia Quick Evaluation

## 2013-04-20 NOTE — Preoperative (Signed)
Beta Blockers   Reason not to administer Beta Blockers:Took BB this AM

## 2013-04-20 NOTE — Discharge Instructions (Signed)
[facility] °Office Phone Number [phone] ° °BREAST BIOPSY/ PARTIAL MASTECTOMY: POST OP INSTRUCTIONS ° °Always review your discharge instruction sheet given to you by the facility where your surgery was performed. ° °IF YOU HAVE DISABILITY OR FAMILY LEAVE FORMS, YOU MUST BRING THEM TO THE OFFICE FOR PROCESSING.  DO NOT GIVE THEM TO YOUR DOCTOR. ° °1. A prescription for pain medication may be given to you upon discharge.  Take your pain medication as prescribed, if needed.  If narcotic pain medicine is not needed, then you may take acetaminophen (Tylenol), naprosyn (Alleve) or ibuprofen (Advil) as needed. °2. Take your usually prescribed medications unless otherwise directed °3. If you need a refill on your pain medication, please contact your pharmacy.  They will contact our office to request authorization.  Prescriptions will not be filled after 5pm or on week-ends. °4. You should eat very light the first 24 hours after surgery, such as soup, crackers, pudding, etc.  Resume your normal diet the day after surgery. °5. Most patients will experience some swelling and bruising in the breast.  Ice packs and a good support bra will help.  Wear the breast binder provided or a sports bra for 72 hours day and night.  After that wear a sports bra during the day until you return to the office. Swelling and bruising can take several days to resolve.  °6. It is common to experience some constipation if taking pain medication after surgery.  Increasing fluid intake and taking a stool softener will usually help or prevent this problem from occurring.  A mild laxative (Milk of Magnesia or Miralax) should be taken according to package directions if there are no bowel movements after 48 hours. °7. Unless discharge instructions indicate otherwise, you may remove your bandages 48 hours after surgery and you may shower at that time.  You may have steri-strips (small skin tapes) in place directly over the incision.   These strips should be left on the skin for 7-10 days and will come off on their own.  If your surgeon used skin glue on the incision, you may shower in 24 hours.  The glue will flake off over the next 2-3 weeks.  Any sutures or staples will be removed at the office during your follow-up visit. °8. ACTIVITIES:  You may resume regular daily activities (gradually increasing) beginning the next day.  Wearing a good support bra or sports bra minimizes pain and swelling.  You may have sexual intercourse when it is comfortable. °a. You may drive when you no longer are taking prescription pain medication, you can comfortably wear a seatbelt, and you can safely maneuver your car and apply brakes. °b. RETURN TO WORK:  ______________________________________________________________________________________ °9. You should see your doctor in the office for a follow-up appointment approximately two weeks after your surgery.  Your doctor’s nurse will typically make your follow-up appointment when she calls you with your pathology report.  Expect your pathology report 3-4 business days after your surgery.  You may call to check if you do not hear from us after three days. °10. OTHER INSTRUCTIONS: _______________________________________________________________________________________________ _____________________________________________________________________________________________________________________________________ °_____________________________________________________________________________________________________________________________________ °_____________________________________________________________________________________________________________________________________ ° °WHEN TO CALL DR Jacob Chamblee: °1. Fever over 101.0 °2. Nausea and/or vomiting. °3. Extreme swelling or bruising. °4. Continued bleeding from incision. °5. Increased pain, redness, or drainage from the incision. ° °The clinic staff is available to  answer your questions during regular business hours.  Please don’t hesitate to call and ask to speak to one of the nurses for   clinical concerns.  If you have a medical emergency, go to the nearest emergency room or call 911.  A surgeon from [facility] is always on call at the hospital. ° °For further questions, please visit [email or web address] ° °

## 2013-04-20 NOTE — Anesthesia Postprocedure Evaluation (Signed)
Anesthesia Post Note  Patient: [name]  Procedure(s) Performed: Procedure(s) (LRB): LEFT BREAST WIRE GUIDED LUMPECTOMY AND AXILLARY SENTINEL LYMPH NODE BX (Left)  Anesthesia type: general  Patient location: PACU  Post pain: Pain level controlled  Post assessment: Patient's Cardiovascular Status Stable  Post vital signs: Reviewed and stable  Level of consciousness: sedated  Complications: No apparent anesthesia complications

## 2013-04-20 NOTE — Op Note (Signed)
Preoperative diagnosis: clinical stage I left breast cancer Postoperative diagnosis: same as above Procedure:  1. Left breast wire guided lumpectomy 2. Left axillary sentinel node biopsy times two 3. Injection of blue dye for sentinel node identification Surgeon: Dr Serita Grammes EBL: minimal Anesthesia: general Specimens: 1. Left breast marked with paint 2. Left axillary sentinel node times two with counts of 229 and 208 Disposition: to recovery stable Sponge and needle count correct at completion  Indications: This is a 68 year old female with multiple comorbidities who presented with a screening abnormality in her left breast. This underwent a biopsy and showed invasive ductal carcinoma. She was seen in our multidisciplinary clinic and we have elected to proceed with breast conservation therapy. We'll plan to do a wire-guided lumpectomy as well as a sentinel lymph node biopsy.  Procedure: After informed consent was obtained the patient was first taken to the breast center. She had a wire placed under mammographic guidance. I had these mammograms in the operating room. She was then injected with technetium in a standard periareolar fashion. She was given cefazolin. Sequential compression devices were on her legs. She was placed under general anesthesia with an LMA. Her left breast was prepped and draped in the standard sterile surgical fashion. A surgical timeout was performed.  I injected a mixture of methylene blue and saline periareolar. I massaged this for 2 minutes. I then made a curvilinear incision overlying the wire tract in the upper outer quadrant left breast. The wire was at 9 cm wire with a lesion about 6 cm from the entrance point on the skin. I then used cautery to locate the wire proximally. I then removed the tissue surrounding the wire in order to get a clear margin all the way down to the pectoralis muscle including the fascia. I then passed off the table as a specimen after  applying paint to mark it. Mammograms taken confirming removal of the clip, mass, and wire. I had pathology review the specimen grossly and it appears my margins are clear grossly. I then obtained hemostasis. I then closed this cavity with 2-0 Vicryl, 3-0 Vicryl, and 4 Monocryl. dermabond and steristrips were placed over the wound. I infiltrated Marcaine throughout   I located the sentinel node. I made a 2 cm incision below the axillary hairline. I carried this through the axillary fascia. I then identified 2 sentinel lymph nodes. The first was radioactive and blue. The second was just radioactive. There was no background radioactivity. The counts are listed above. Hemostasis was obtained. I used several clips in this region. I then closed the x-ray fashion with a 2-0 Vicryl. There was closed with 0 Vicryl the skin with 4 Monocryl. A sterile dressing was placed over this. This was also infiltrated with Marcaine. She was extubated transferred to recovery stable.

## 2013-04-20 NOTE — Transfer of Care (Signed)
Immediate Anesthesia Transfer of Care Note  Patient: [name]  Procedure(s) Performed: Procedure(s): LEFT BREAST WIRE GUIDED LUMPECTOMY AND AXILLARY SENTINEL LYMPH NODE BX (Left)  Patient Location: PACU  Anesthesia Type:General  Level of Consciousness: awake, alert  and patient cooperative  Airway & Oxygen Therapy: Patient Spontanous Breathing and Patient connected to face mask oxygen  Post-op Assessment: Report given to PACU RN, Post -op Vital signs reviewed and stable and Patient moving all extremities  Post vital signs: Reviewed and stable  Complications: No apparent anesthesia complications

## 2013-04-21 LAB — GLUCOSE, CAPILLARY
GLUCOSE-CAPILLARY: 324 mg/dL — AB (ref 70–99)
GLUCOSE-CAPILLARY: 280 mg/dL — AB (ref 70–99)
GLUCOSE-CAPILLARY: 241 mg/dL — AB (ref 70–99)
Glucose-Capillary: 119 mg/dL — ABNORMAL HIGH (ref 70–99)

## 2013-04-21 MED ORDER — OXYCODONE HCL 5 MG PO TABS: 5.0000 mg | ORAL_TABLET | ORAL | Status: DC | PRN

## 2013-04-21 NOTE — Progress Notes (Signed)
Patient's CBG barcode on bracelet unreadable after multiple scan attempts.  Performed override to take blood sugar, but data never transferred to computer.  HS blood sugar: 280. Patient covered with 3 units Novalog per MD order.

## 2013-04-21 NOTE — Discharge Summary (Signed)
Physician Discharge Summary  Patient ID: [name] MRN: [id number] DOB/AGE: Sep 04, 1945 68 y.o.  Admit date: 04/20/2013 Discharge date: 04/21/2013  Admission Diagnoses: PVD CAD Breast cancer Discharge Diagnoses:  Active Problems:   Breast cancer   Discharged Condition: good  Hospital Course: 89 yof admitted and underwent left breast wire guided lumpectomy and sentinel node biopsy without complication.  She remained overnight for observation given comorbidities.  She is doing well and will be discharged home.  Consults: None  Significant Diagnostic Studies: none  Treatments: surgery: left breast lumpectomy, left axillary snbx  Discharge Exam: Blood pressure 145/85, pulse 93, temperature 98.1 F (36.7 C), temperature source Oral, resp. rate 16, height 5\' 4"  (1.626 m), weight 179 lb 6.4 oz (81.375 kg), SpO2 96.00%. General appearance: no distress Resp: clear to auscultation bilaterally Breasts: incision without infection or hematoma left breast, left axillary incision without hematoma Cardio: regular rate and rhythm  Disposition: 01-Home or Self Care   Future Appointments Provider Department Dept Phone   04/25/2013 9:30 AM Vicie Mutters, PA-C [facility] [phone]   04/28/2013 2:00 PM [facility] Only [facility] [phone]   04/28/2013 2:30 PM Chauncey Cruel, [facility] [phone]   05/05/2013 11:20 AM Rolm Bookbinder, MD [facility], [place]   05/09/2013 10:00 AM [facility] Nurse [facility] [phone]   05/09/2013 10:30 AM Marye Round, [facility] [phone]   07/27/2013 9:30 AM Unk Pinto, MD [facility] [phone]   10/27/2013 8:45 AM Ardis Hughs, PA-C [facility] [phone]        Medication List         allopurinol 100 MG tablet  Commonly known as:  ZYLOPRIM  Take 100 mg by mouth 2 (two) times daily.     Alpha-Lipoic Acid 200 MG Caps  Take 200 mg by mouth 2 (two) times daily.     aspirin 325 MG tablet  Take 325 mg by mouth at bedtime.     carvedilol 12.5 MG tablet  Commonly known as:  COREG  Take 1 tablet (12.5 mg total) by mouth 2 (two) times daily with a meal.     cholecalciferol 1000 UNITS tablet  Commonly known as:  VITAMIN D  Take 2,000 Units by mouth daily.     Cinnamon 500 MG capsule  Take 1,000 mg by mouth 2 (two) times daily.     Co Q-10 100 MG Caps  Take 100 mg by mouth 2 (two) times daily.     COLCRYS 0.6 MG tablet  Generic drug:  colchicine  Take 0.6 mg by mouth daily as needed (for gout flareups).     fish oil-omega-3 fatty acids 1000 MG capsule  Take 1 g by mouth 2 (two) times daily.     glimepiride 4 MG tablet  Commonly known as:  AMARYL  Take 4 mg by mouth 2 (two) times daily.     hydrochlorothiazide 12.5 MG tablet  Commonly known as:  HYDRODIURIL  Take 12.5 mg by mouth daily.     insulin glargine 100 UNIT/ML injection  Commonly known as:  LANTUS  Inject 35 Units into the skin every morning.     metformin 1000 MG (OSM) 24 hr tablet  Commonly known as:  FORTAMET  Take 1,000 mg by mouth 2 (two) times daily with a meal.     oxyCODONE 5 MG  immediate release tablet  Commonly known as:  Oxy IR/ROXICODONE  Take 1 tablet (5 mg total) by mouth every 4 (four) hours as needed for moderate pain.     rosuvastatin 20 MG tablet  Commonly known as:  CRESTOR  Take 20 mg by mouth daily.     valsartan 320 MG tablet  Commonly known as:  DIOVAN  Take 320 mg by mouth daily.           Follow-up Information   Follow up with [facility], MD In 2 weeks.   Specialty:  General Surgery   Contact information:   [address] [place] [address] [phone]       Signed: Rolm Bookbinder 04/21/2013, 7:44  AM

## 2013-04-22 ENCOUNTER — Encounter (HOSPITAL_COMMUNITY): Payer: Self-pay | Admitting: General Surgery

## 2013-04-25 ENCOUNTER — Ambulatory Visit: Payer: Self-pay | Admitting: Physician Assistant

## 2013-04-28 ENCOUNTER — Other Ambulatory Visit: Payer: Self-pay | Admitting: Oncology

## 2013-04-28 ENCOUNTER — Telehealth: Payer: Self-pay | Admitting: Oncology

## 2013-04-28 ENCOUNTER — Ambulatory Visit (HOSPITAL_BASED_OUTPATIENT_CLINIC_OR_DEPARTMENT_OTHER): Payer: Medicare Other | Admitting: Oncology

## 2013-04-28 ENCOUNTER — Other Ambulatory Visit (HOSPITAL_BASED_OUTPATIENT_CLINIC_OR_DEPARTMENT_OTHER): Payer: Medicare Other

## 2013-04-28 VITALS — BP 185/79 | HR 92 | Temp 98.1°F | Resp 20 | Ht 64.0 in | Wt 176.4 lb

## 2013-04-28 DIAGNOSIS — C50919 Malignant neoplasm of unspecified site of unspecified female breast: Secondary | ICD-10-CM

## 2013-04-28 DIAGNOSIS — M8580 Other specified disorders of bone density and structure, unspecified site: Secondary | ICD-10-CM

## 2013-04-28 DIAGNOSIS — C50419 Malignant neoplasm of upper-outer quadrant of unspecified female breast: Secondary | ICD-10-CM

## 2013-04-28 DIAGNOSIS — K5900 Constipation, unspecified: Secondary | ICD-10-CM

## 2013-04-28 DIAGNOSIS — C50212 Malignant neoplasm of upper-inner quadrant of left female breast: Secondary | ICD-10-CM

## 2013-04-28 LAB — CBC WITH DIFFERENTIAL/PLATELET
BASO%: 1 % (ref 0.0–2.0)
Basophils Absolute: 0.1 10*3/uL (ref 0.0–0.1)
EOS ABS: 0.3 10*3/uL (ref 0.0–0.5)
EOS%: 2.8 % (ref 0.0–7.0)
HCT: 40.9 % (ref 34.8–46.6)
HGB: 13.5 g/dL (ref 11.6–15.9)
LYMPH#: 2.4 10*3/uL (ref 0.9–3.3)
LYMPH%: 24.9 % (ref 14.0–49.7)
MCH: 29.4 pg (ref 25.1–34.0)
MCHC: 32.9 g/dL (ref 31.5–36.0)
MCV: 89.4 fL (ref 79.5–101.0)
MONO#: 0.9 10*3/uL (ref 0.1–0.9)
MONO%: 9.6 % (ref 0.0–14.0)
NEUT%: 61.7 % (ref 38.4–76.8)
NEUTROS ABS: 5.9 10*3/uL (ref 1.5–6.5)
Platelets: 256 10*3/uL (ref 145–400)
RBC: 4.58 10*6/uL (ref 3.70–5.45)
RDW: 14.8 % — AB (ref 11.2–14.5)
WBC: 9.5 10*3/uL (ref 3.9–10.3)

## 2013-04-28 LAB — COMPREHENSIVE METABOLIC PANEL (CC13)
ALK PHOS: 78 U/L (ref 40–150)
ALT: 23 U/L (ref 0–55)
AST: 21 U/L (ref 5–34)
Albumin: 3.6 g/dL (ref 3.5–5.0)
Anion Gap: 10 mEq/L (ref 3–11)
BUN: 33.3 mg/dL — ABNORMAL HIGH (ref 7.0–26.0)
CHLORIDE: 100 meq/L (ref 98–109)
CO2: 31 mEq/L — ABNORMAL HIGH (ref 22–29)
Calcium: 10.4 mg/dL (ref 8.4–10.4)
Creatinine: 1.8 mg/dL — ABNORMAL HIGH (ref 0.6–1.1)
GLUCOSE: 199 mg/dL — AB (ref 70–140)
POTASSIUM: 4.6 meq/L (ref 3.5–5.1)
Sodium: 141 mEq/L (ref 136–145)
TOTAL PROTEIN: 6.8 g/dL (ref 6.4–8.3)
Total Bilirubin: 0.37 mg/dL (ref 0.20–1.20)

## 2013-04-28 MED ORDER — ANASTROZOLE 1 MG PO TABS
1.0000 mg | ORAL_TABLET | Freq: Every day | ORAL | Status: DC
Start: 2013-04-28 — End: 2014-05-22

## 2013-04-28 NOTE — Progress Notes (Signed)
ID: [name] OB: 06-Jul-1945  MR#: [id number]  QGB#:[id number]  PCP: Alesia Richards, MD GYN:   SU: [name] OTHER MD: Kyung Rudd, Meridee Score, [name] Cornella  CHIEF COMPLAINT: "I had my surgery."  HISTORY OF PRESENT ILLNESS: [name] (also goes by "[name]") had bilateral screening mammography at [facility] 02/02/2013. There was a 3 mm nodule in the left breast which was further evaluated November 4, confirming an irregular mass at the 1:00 position of the left breast, not confirmed by ultrasonography. Biopsy of this mass 02/10/2013 showed an invasive ductal carcinoma, grade 1, with a prognostic panel pending. Breast MRI has been scheduled for 02/24/2013  The patient's subsequent history is as detailed below  INTERVAL HISTORY: [name] returns today for followup of her breast cancer. The interval history is significant for having undergone her lumpectomy and sentinel lymph node sampling 04/20/2013. The final pathology ([id number]) showed a we'll 0.5 cm invasive ductal carcinoma, grade 1, with negative margins, and both sentinel lymph nodes clear.  REVIEW OF SYSTEMS: She had a bit more pain from the surgery then she anticipated, and she is still taking Percocet occasionally. This is constipating her. She is using some over-the-counter stool softeners and laxatives for that. There was no bleeding or fever postop. She has mild sinus symptoms, her usual low back pain which is not more frequent or intense than prior, and of course problem secondary to her diabetes. A detailed review of systems today was otherwise negative.   PAST MEDICAL HISTORY: Past Medical History  Diagnosis Date  . Hypertension   . Coronary artery disease     NSTEMI in 05/2009;  LHC with 2 v CAD - > CABG (LIMA-LAD, SVG-diagonal, SVG-OM1/OM 2).   . Hyperlipidemia   . Ischemic cardiomyopathy     Echo 07/2009: Mild LVH, EF 40-45%, inferior and posterior HK, grade 1 diastolic dysfunction, moderate MAC, mild to moderate  MR, mild LAE  . PAD (peripheral artery disease)   . Family history of anesthesia complication     Mom has a hard time to wake up  . Neuropathy     Hx; of B/L feet  . Type II diabetes mellitus   . Foot ulcer     "I've had them on both feet" (08/27/2012)  . Osteomyelitis of foot   . Sinus headache     "occasionally" (08/27/2012)  . Arthritis     "maybe in my lower back" (08/27/2012)  . Gouty arthritis     "right index finger" (08/27/2012)  . CKD (chronic kidney disease), stage I   . NSTEMI (non-ST elevated myocardial infarction) 05/23/2009    Archie Endo 05/23/2009 (08/27/2012)  . Breast cancer 2014    PAST SURGICAL HISTORY: Past Surgical History  Procedure Laterality Date  . Cataract extraction w/ intraocular lens  implant, bilateral  2000's  . Finger surgery Right     "index finger; turned out to be gout" (08/27/2012)  . Coronary artery bypass graft  2011    "CABG X4" (08/27/2012)  . Dental surgery  04/30/11    "1 implant" (08/27/2012)  . Amputation ray Right 08/27/2012    5th ray/notes 08/27/2012  . Cardiac catheterization  05/24/2009    Archie Endo 05/24/2009 (08/27/2012)  . Amputation Right 08/27/2012    Procedure: AMPUTATION RAY;  Surgeon: Newt Minion, MD;  Location: [facility];  Service: Orthopedics;  Laterality: Right;  Right Foot 5th Ray Amputation  . Eye surgery    . Colonoscopy w/ biopsies and polypectomy  2010  . Breast lumpectomy  04/20/2013    with biopsy      DR WAKEFIELD  . Breast lumpectomy with needle localization and axillary sentinel lymph node bx Left 04/20/2013    Procedure: LEFT BREAST WIRE GUIDED LUMPECTOMY AND AXILLARY SENTINEL LYMPH NODE BX;  Surgeon: Pamela Bookbinder, MD;  Location: [facility];  Service: General;  Laterality: Left;    FAMILY HISTORY Family History  Problem Relation Age of Onset  . Kidney cancer Brother 30  . Hypertension Brother   . Breast cancer Sister 59    LCIS; BRCA negative  . Throat cancer Father 44    smoker  . Breast cancer Maternal Aunt     dx  in her 9s  . Breast cancer Sister 48    Lobular breast cancer   patient's father died at the age of 59 from throat cancer. The patient's mother died at the age of 28 from "many medical problems". The patient had 4 brothers, 3 sisters. One brother had kidney cancer diagnosed at age 34. One sister was diagnosed with breast cancer at age 74 and died Oct 31, 2012. A second sister was diagnosed with breast cancer at age 45. There is also one of the patient's mother's 5 sisters diagnosed with breast cancer remotely.  GYNECOLOGIC HISTORY:  Menarche age 5 am first live birth age 13, the patient is [name]. (She had a twin pregnancy, [name] and [name], delivered prematurely, with the babies dying after a half day). She underwent menopause in her late 46s and used hormone replacement for a few months.  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 Their both now retired. Son [name] lives in [place] where he works as a Proofreader. The patient has 3 grandchildren. She attends first [employer] in [place]: In place   HEALTH MAINTENANCE: History  Substance Use Topics  . Smoking status: Never Smoker   . Smokeless tobacco: Never Used  . Alcohol Use: No     Colonoscopy: 2010  PAP: 2010  Bone density: 10/21/2007 at [facility], normal  Lipid panel:  Allergies  Allergen Reactions  . Atenolol Other (See Comments)    Exacerbates gout  . Latex Rash  . Omnipaque [Iohexol] Rash    Current Outpatient Prescriptions  Medication Sig Dispense Refill  . allopurinol (ZYLOPRIM) 100 MG tablet Take 100 mg by mouth 2 (two) times daily.      . Alpha-Lipoic Acid 200 MG CAPS Take 200 mg by mouth 2 (two) times daily.      Marland Kitchen aspirin 325 MG tablet Take 325 mg by mouth at bedtime.       . carvedilol (COREG) 12.5 MG tablet Take 1 tablet (12.5 mg total) by mouth 2 (two) times daily with a meal.  180 tablet  0  . cholecalciferol (VITAMIN  D) 1000 UNITS tablet Take 2,000 Units by mouth daily.      . Cinnamon 500 MG capsule Take 1,000 mg by mouth 2 (two) times daily.       . Coenzyme Q10 (CO Q-10) 100 MG CAPS Take 100 mg by mouth 2 (two) times daily.      Marland Kitchen COLCRYS 0.6 MG tablet Take 0.6 mg by mouth daily as needed (for gout flareups).       . fish oil-omega-3 fatty acids 1000 MG capsule Take 1 g by mouth 2 (two) times daily.       Marland Kitchen glimepiride (AMARYL) 4 MG tablet Take  4 mg by mouth 2 (two) times daily.       . hydrochlorothiazide (HYDRODIURIL) 12.5 MG tablet Take 12.5 mg by mouth daily.      . insulin glargine (LANTUS) 100 UNIT/ML injection Inject 35 Units into the skin every morning.       . metformin (FORTAMET) 1000 MG (OSM) 24 hr tablet Take 1,000 mg by mouth 2 (two) times daily with a meal.      . oxyCODONE (OXY IR/ROXICODONE) 5 MG immediate release tablet Take 1 tablet (5 mg total) by mouth every 4 (four) hours as needed for moderate pain.  30 tablet  0  . rosuvastatin (CRESTOR) 20 MG tablet Take 20 mg by mouth daily.      . valsartan (DIOVAN) 320 MG tablet Take 320 mg by mouth daily.       No current facility-administered medications for this visit.    OBJECTIVE: Middle-aged white woman who appears stated age 21 Vitals:   04/28/13 1418  BP: 185/79  Pulse: 92  Temp: 98.1 F (36.7 C)  Resp: 20     Body mass index is 30.26 kg/(m^2).    ECOG FS:1 - Symptomatic but completely ambulatory  Ocular: Sclerae unicteric, pupils equal and round Ear-nose-throat: Oropharynx clear, no thrush or other lesions Lymphatic: No cervical or supraclavicular adenopathy Lungs no rales or rhonchi, good excursion bilaterally Heart regular rate and rhythm, no murmur appreciated Abd soft, nontender, positive bowel sounds MSK no focal spinal tenderness, no joint edema, shoes not removed may the sternal incision nicely healed;  Neuro: non-focal, well-oriented, appropriate affect Breasts: The right breast is unremarkable. The left breast  is status post  lumpectomy. Steri-Strips are still in place. The cosmetic result is excellent. There is no evidence of local recurrence. Left axilla is benign.   LAB RESULTS:  CMP     Component Value Date/Time   NA 139 04/12/2013 1107   K 4.3 04/12/2013 1107   CL 100 04/12/2013 1107   CO2 26 04/12/2013 1107   GLUCOSE 203* 04/12/2013 1107   BUN 42* 04/12/2013 1107   CREATININE 1.46* 04/12/2013 1107   CALCIUM 9.5 04/12/2013 1107   PROT 7.2 08/27/2012 1124   ALBUMIN 2.9* 08/27/2012 1124   AST 14 08/27/2012 1124   ALT 14 08/27/2012 1124   ALKPHOS 87 08/27/2012 1124   BILITOT 0.3 08/27/2012 1124   GFRNONAA 36* 04/12/2013 1107   GFRAA 42* 04/12/2013 1107    I No results found for this basename: SPEP,  UPEP,   kappa and lambda light chains    Lab Results  Component Value Date   WBC 9.5 04/28/2013   NEUTROABS 5.9 04/28/2013   HGB 13.5 04/28/2013   HCT 40.9 04/28/2013   MCV 89.4 04/28/2013   PLT 256 04/28/2013      Chemistry      Component Value Date/Time   NA 139 04/12/2013 1107   K 4.3 04/12/2013 1107   CL 100 04/12/2013 1107   CO2 26 04/12/2013 1107   BUN 42* 04/12/2013 1107   CREATININE 1.46* 04/12/2013 1107      Component Value Date/Time   CALCIUM 9.5 04/12/2013 1107   ALKPHOS 87 08/27/2012 1124   AST 14 08/27/2012 1124   ALT 14 08/27/2012 1124   BILITOT 0.3 08/27/2012 1124       No results found for this basename: LABCA2    No components found with this basename: [id number]    No results found for this basename: INR,  in the last  168 hours  Urinalysis    Component Value Date/Time   COLORURINE YELLOW 05/30/2009 1500   APPEARANCEUR CLOUDY* 05/30/2009 1500   LABSPEC 1.029 05/30/2009 1500   PHURINE 5.0 05/30/2009 1500   GLUCOSEU NEGATIVE 05/30/2009 1500   HGBUR TRACE* 05/30/2009 1500   BILIRUBINUR NEGATIVE 05/30/2009 1500   KETONESUR NEGATIVE 05/30/2009 1500   PROTEINUR 100* 05/30/2009 1500   UROBILINOGEN 0.2 05/30/2009 1500   NITRITE NEGATIVE 05/30/2009 1500   LEUKOCYTESUR NEGATIVE 05/30/2009 1500     STUDIES: Dg Chest 2 View  04/12/2013   CLINICAL DATA:  Preoperative for breast surgery, history of CABG  EXAM: CHEST  2 VIEW  COMPARISON:  June 19, 2009  FINDINGS: The lungs are adequately inflated. The costophrenic angles are sharp today. There is no alveolar edema or infiltrate. The interstitial markings are minimally prominent though stable. The cardiac silhouette is normal in size. There is calcification of the mitral valvular annulus. The patient has undergone previous median sternotomy and CABG. There is mild tortuosity of the descending thoracic aorta. The observed portions of the bony thorax appear normal.  IMPRESSION: There is no evidence of CHF nor pneumonia nor other acute cardiopulmonary abnormality.   Electronically Signed   By: David  Martinique   On: 04/12/2013 14:33   Nm Sentinel Node Inj-no Rpt (breast)  04/20/2013   CLINICAL DATA: left breast wire guided biopsy   Sulfur colloid was injected intradermally by the nuclear medicine  technologist for breast cancer sentinel node localization.    Mm Lt Plc Breast Loc Dev   1st Lesion  Inc Mammo Guide  04/20/2013   CLINICAL DATA:  Patient with the left breast invasive ductal carcinoma  EXAM: NEEDLE LOCALIZATION OF THE LEFT BREAST WITH MAMMO GUIDANCE  COMPARISON:  Previous exams.  FINDINGS: Patient presents for needle localization prior to lumpectomy. I met with the patient and we discussed the procedure of needle localization including benefits and alternatives. We discussed the high likelihood of a successful procedure. We discussed the risks of the procedure, including infection, bleeding, tissue injury, and further surgery. Informed, written consent was given. The usual time-out protocol was performed immediately prior to the procedure.  Using mammographic guidance, sterile technique, 2% lidocaine and a 9 cm modified Kopans needle, the spiculated mass and biopsy marking clip were localized using a lateral approach. The films were marked for  Dr. Donne Hazel.  Specimen radiograph was performed at day surgery and confirms spiculated mass and a biopsy marking clip present in the tissue sample. The specimen was marked for pathology.  IMPRESSION: Needle localization mass and biopsy marking clip within the left breast. No apparent complications.   Electronically Signed   By: Lovey Newcomer M.D.   On: 04/20/2013 12:17    ASSESSMENT: 68 y.o. BRCA negative [place] woman status post left breast biopsy 02/10/2013 for a clinical T1a N0, stage IA invasive ductal carcinoma, grade 1, with a prognostic panel pending  (1) status post left lumpectomy and sentinel lymph node sampling 04/20/2013 for a pT1a pN0, stage IA invasive ductal carcinoma, grade 1, with negative margins  PLAN: We discussed her pathology and her overall situation at length. Marley has an excellent prognosis. In fact tumors as small as hers do fine just with local treatment, meaning surgery plus radiation.  She did have any questions regarding radiation and address those, but she also is meeting with Dr. Lisbeth Renshaw soon and he will help her operationalize those treatments.  She will then be ready to start anti-estrogen therapy. In her case,  again because of the very good prognosis, this is optional according to NCCN guidelines. I would prefer to stay away from tamoxifen because of clotting issues. Accordingly we could try anastrozole, and I suggested she start that after completion of radiation. She will see me after being on anastrozole approximately 2 months. If she is tolerating it well, we can continue. To help [name] with that decision we will obtain a bone density before the next visit here.  Alyda has a good understanding of the overall plan, and agrees with it. She knows a goal of treatment is cure. She will call with any problems that may develop before her next visit here.    Chauncey Cruel, MD   04/28/2013 2:20 PM

## 2013-05-04 ENCOUNTER — Telehealth: Payer: Self-pay | Admitting: *Deleted

## 2013-05-04 NOTE — Telephone Encounter (Signed)
Patient calling in to let us know that she has rec'd her Anastozole but didn't know when she was supposed to start. According to Dr Jana Hakim, she is to start this after her RT is complete, giving the meds time to show if they will have any side effects. Patient states she is still not sure if she wants to take the pill or not, she will do more research and either start taking or let us know at her visit in May if she chooses not to take. Either way, patient is to keep appt with Dr Jana Hakim in May.

## 2013-05-05 ENCOUNTER — Encounter (INDEPENDENT_AMBULATORY_CARE_PROVIDER_SITE_OTHER): Payer: Medicare Other | Admitting: General Surgery

## 2013-05-05 ENCOUNTER — Ambulatory Visit (INDEPENDENT_AMBULATORY_CARE_PROVIDER_SITE_OTHER): Payer: Medicare Other | Admitting: General Surgery

## 2013-05-05 ENCOUNTER — Encounter: Payer: Self-pay | Admitting: *Deleted

## 2013-05-05 ENCOUNTER — Encounter (INDEPENDENT_AMBULATORY_CARE_PROVIDER_SITE_OTHER): Payer: Self-pay | Admitting: General Surgery

## 2013-05-05 VITALS — BP 132/78 | HR 82 | Temp 98.0°F | Resp 18 | Ht 64.0 in | Wt 178.0 lb

## 2013-05-05 DIAGNOSIS — Z09 Encounter for follow-up examination after completed treatment for conditions other than malignant neoplasm: Secondary | ICD-10-CM

## 2013-05-05 DIAGNOSIS — H04129 Dry eye syndrome of unspecified lacrimal gland: Secondary | ICD-10-CM | POA: Diagnosis not present

## 2013-05-05 NOTE — Progress Notes (Unsigned)
Location of Breast Cancer: Left upper inner quadrant  Histology per Pathology Report: Diagnosis 02/10/13:Breast, left, needle core biopsy- INVASIVE DUCTAL CARCINOMA.- DUCTAL CARCINOMA IN SITU WITH CALCIFICATIONS  Receptor Status: ER(   +), PR (   +), Her2-neu (   No amplification  Ration 1.38,)  Did patient present with symptoms (if so, please note symptoms) or was this found on screening mammography?: found on screening   Past/Anticipated interventions by surgeon, if any:Diagnosis 1/14/151. Breast, lumpectomy, left- INVASIVE DUCTAL CARCINOMA, GRADE I/III, SPANNING 0.15 CM.- DUCTAL CARCINOMA IN SITU, LOW GRADE. - THE SURGICAL RESECTION MARGINS ARE NEGATIVE FOR CARCINOMA.- SEE ONCOLOGY TABLE BELOW.2. Lymph node, sentinel, biopsy, Left axillary- THERE IS NO EVIDENCE OF CARCINOMA IN 1 OF 1 LYMPH NODE (0/1).- SEE COMMENT.3. Lymph node, sentinel, biopsy, Left axillary #2- THERE IS NO EVIDENCE OF CARCINOMA IN 1 OF 1 LYMPH NODE (0/1).Dr.Matthew Wakefield appt Dr.Wakefield 1/29/15referral to ABC class f/u in 6 months  Past/Anticipated interventions by medical oncology, if any: Chemotherapy Dr.Magrinat appt 04/28/13, RX Arimidex 1mg po daily , BRCA negative   Lymphedema issues, if any:   Pain issues, if any:    SAFETY ISSUES:  Prior radiation? NO  Pacemaker/ICD? no  Possible current pregnancy?no  Is the patient on methotrexate? no  Current Complaints / other details:  Menarche 9 ,  menopause late 1990's, used HRT for a few months,1 sister deceased with breast cancer, another sister who may have DCIS, Brother kidney cancer, father throat cancer, maternal aunt breast cancer, , never smoked, no alcohol or illicit drugs, never used smokeless tobacco Seen in multidisciplinary breast clinic 02/22/13    [name], Janice Bruner, RN 05/05/2013,6:35 PM   

## 2013-05-05 NOTE — Progress Notes (Signed)
Subjective:     Patient ID: [name], female   DOB: Aug 16, 1945, 68 y.o.   MRN: [id number]  HPI  68 yof who has now undergone a left breast lumpectomy and sentinel node biopsy. Her node is negative and this is a 15 mm tumor. Margins were all negative This is a stage I breast cancer. She is doing well postoperatively he returns today in followup.  Review of Systems     Objective:   Physical Exam Left breast incision and left axillary incision clean without infection    Assessment:     Stage I left breast cancer     Plan:     She is doing well. She can begin returning to normal activity. I will refer her to the ABC class. She has been seen by medical oncology will see radiation oncology soon. I will have her see me in 6 months.

## 2013-05-09 ENCOUNTER — Encounter: Payer: Self-pay | Admitting: Radiation Oncology

## 2013-05-09 ENCOUNTER — Ambulatory Visit
Admission: RE | Admit: 2013-05-09 | Discharge: 2013-05-09 | Disposition: A | Discharge status: Home / Self Care | Payer: Medicare Other | Source: Ambulatory Visit | Attending: Radiation Oncology | Admitting: Radiation Oncology

## 2013-05-09 VITALS — BP 182/79 | HR 82 | Temp 98.5°F | Resp 20 | Ht 64.5 in | Wt 179.6 lb

## 2013-05-09 DIAGNOSIS — Z91040 Latex allergy status: Secondary | ICD-10-CM | POA: Diagnosis not present

## 2013-05-09 DIAGNOSIS — I771 Stricture of artery: Secondary | ICD-10-CM | POA: Diagnosis not present

## 2013-05-09 DIAGNOSIS — Z888 Allergy status to other drugs, medicaments and biological substances status: Secondary | ICD-10-CM | POA: Diagnosis not present

## 2013-05-09 DIAGNOSIS — Z794 Long term (current) use of insulin: Secondary | ICD-10-CM | POA: Diagnosis not present

## 2013-05-09 DIAGNOSIS — C50212 Malignant neoplasm of upper-inner quadrant of left female breast: Secondary | ICD-10-CM

## 2013-05-09 DIAGNOSIS — I059 Rheumatic mitral valve disease, unspecified: Secondary | ICD-10-CM | POA: Diagnosis not present

## 2013-05-09 DIAGNOSIS — M109 Gout, unspecified: Secondary | ICD-10-CM | POA: Diagnosis not present

## 2013-05-09 DIAGNOSIS — Z951 Presence of aortocoronary bypass graft: Secondary | ICD-10-CM | POA: Diagnosis not present

## 2013-05-09 DIAGNOSIS — Z79899 Other long term (current) drug therapy: Secondary | ICD-10-CM | POA: Insufficient documentation

## 2013-05-09 DIAGNOSIS — C50919 Malignant neoplasm of unspecified site of unspecified female breast: Secondary | ICD-10-CM | POA: Diagnosis not present

## 2013-05-09 DIAGNOSIS — C50219 Malignant neoplasm of upper-inner quadrant of unspecified female breast: Secondary | ICD-10-CM | POA: Diagnosis not present

## 2013-05-09 DIAGNOSIS — Z91041 Radiographic dye allergy status: Secondary | ICD-10-CM | POA: Diagnosis not present

## 2013-05-09 NOTE — Progress Notes (Signed)
Please see the Nurse Progress Note in the MD Initial Consult Encounter for this patient. 

## 2013-05-09 NOTE — Progress Notes (Signed)
Radiation Oncology         [phone] ________________________________  Name: [name] MRN: [id number]  Date: 05/09/2013  DOB: 1945-08-11  Follow-Up Visit Note  CC: Alesia Richards, MD  Rolm Bookbinder, MD  Diagnosis:   Invasive ductal carcinoma of the left breast  Interval Since Last Radiation:  Not applicable   Narrative:  The patient returns today for routine follow-up.  The patient has proceeded with a lumpectomy since she was seen in breast clinic. This was completed on 04/20/2013. This revealed an invasive ductal carcinoma, grade 1, measuring 0.15cm.  this was associated with ductal carcinoma in situ, low-grade. The margins were negative.  The 2 sentinel lymph nodes were negative for carcinoma.    The patient has been seen by medical oncology and will not receive chemotherapy. She has discussed anti-hormonal treatment. She states that she is doing well postoperatively.                           ALLERGIES:  is allergic to atenolol; latex; and omnipaque.  Meds: Current Outpatient Prescriptions  Medication Sig Dispense Refill  . allopurinol (ZYLOPRIM) 100 MG tablet Take 100 mg by mouth 2 (two) times daily.      . Alpha-Lipoic Acid 200 MG CAPS Take 200 mg by mouth 2 (two) times daily.      Marland Kitchen aspirin 325 MG tablet Take 325 mg by mouth at bedtime.       . carvedilol (COREG) 12.5 MG tablet Take 1 tablet (12.5 mg total) by mouth 2 (two) times daily with a meal.  180 tablet  0  . Coenzyme Q10 (CO Q-10) 100 MG CAPS Take 100 mg by mouth 2 (two) times daily.      Marland Kitchen COLCRYS 0.6 MG tablet Take 0.6 mg by mouth daily as needed (for gout flareups).       Marland Kitchen glimepiride (AMARYL) 4 MG tablet Take 4 mg by mouth 2 (two) times daily.       . hydrochlorothiazide (HYDRODIURIL) 12.5 MG tablet Take 12.5 mg by mouth daily.      . insulin glargine (LANTUS) 100 UNIT/ML injection Inject 35 Units into the skin every morning.       . metformin (FORTAMET) 1000 MG (OSM) 24 hr tablet Take 1,000 mg by  mouth 2 (two) times daily with a meal.      . rosuvastatin (CRESTOR) 20 MG tablet Take 20 mg by mouth daily.      . valsartan (DIOVAN) 320 MG tablet Take 320 mg by mouth daily.      Marland Kitchen anastrozole (ARIMIDEX) 1 MG tablet Take 1 tablet (1 mg total) by mouth daily.  90 tablet  4  . cholecalciferol (VITAMIN D) 1000 UNITS tablet Take 2,000 Units by mouth daily.      . Cinnamon 500 MG capsule Take 1,000 mg by mouth 2 (two) times daily.       . fish oil-omega-3 fatty acids 1000 MG capsule Take 1 g by mouth 2 (two) times daily.       Marland Kitchen oxyCODONE (OXY IR/ROXICODONE) 5 MG immediate release tablet Take 1 tablet (5 mg total) by mouth every 4 (four) hours as needed for moderate pain.  30 tablet  0   No current facility-administered medications for this encounter.    Physical Findings: The patient is in no acute distress. Patient is alert and oriented.  height is 5' 4.5" (1.638 m) and weight is 179 lb 9.6 oz (81.466  kg). Her oral temperature is 98.5 F (36.9 C). Her blood pressure is 182/79 and her pulse is 82. Her respiration is 20. .     Lab Findings: Lab Results  Component Value Date   WBC 9.5 04/28/2013   HGB 13.5 04/28/2013   HCT 40.9 04/28/2013   MCV 89.4 04/28/2013   PLT 256 04/28/2013     Radiographic Findings: Dg Chest 2 View  04/12/2013   CLINICAL DATA:  Preoperative for breast surgery, history of CABG  EXAM: CHEST  2 VIEW  COMPARISON:  June 19, 2009  FINDINGS: The lungs are adequately inflated. The costophrenic angles are sharp today. There is no alveolar edema or infiltrate. The interstitial markings are minimally prominent though stable. The cardiac silhouette is normal in size. There is calcification of the mitral valvular annulus. The patient has undergone previous median sternotomy and CABG. There is mild tortuosity of the descending thoracic aorta. The observed portions of the bony thorax appear normal.  IMPRESSION: There is no evidence of CHF nor pneumonia nor other acute cardiopulmonary  abnormality.   Electronically Signed   By: David  Martinique   On: 04/12/2013 14:33   Nm Sentinel Node Inj-no Rpt (breast)  04/20/2013   CLINICAL DATA: left breast wire guided biopsy   Sulfur colloid was injected intradermally by the nuclear medicine  technologist for breast cancer sentinel node localization.    Mm Lt Plc Breast Loc Dev   1st Lesion  Inc Mammo Guide  04/20/2013   CLINICAL DATA:  Patient with the left breast invasive ductal carcinoma  EXAM: NEEDLE LOCALIZATION OF THE LEFT BREAST WITH MAMMO GUIDANCE  COMPARISON:  Previous exams.  FINDINGS: Patient presents for needle localization prior to lumpectomy. I met with the patient and we discussed the procedure of needle localization including benefits and alternatives. We discussed the high likelihood of a successful procedure. We discussed the risks of the procedure, including infection, bleeding, tissue injury, and further surgery. Informed, written consent was given. The usual time-out protocol was performed immediately prior to the procedure.  Using mammographic guidance, sterile technique, 2% lidocaine and a 9 cm modified Kopans needle, the spiculated mass and biopsy marking clip were localized using a lateral approach. The films were marked for Dr. Donne Hazel.  Specimen radiograph was performed at day surgery and confirms spiculated mass and a biopsy marking clip present in the tissue sample. The specimen was marked for pathology.  IMPRESSION: Needle localization mass and biopsy marking clip within the left breast. No apparent complications.   Electronically Signed   By: Lovey Newcomer M.D.   On: 04/20/2013 12:17    Impression:    The patient is status post a lumpectomy for a T1 A. N0 M0 invasive ductal carcinoma of the left breast. She is appropriate to proceed with adjuvant radiation treatment at this time.  I discussed with the patient the rationale of radiation treatment in terms of local/regional control. We discussed the possible side effects  and risks of treatment as well. All of her questions were answered. She states that she does wish to proceed with treatment.  Plan:  The patient will be scheduled for a simulation in the near future such that we can proceed with treatment planning. I anticipate treating the patient with a four-week course of radiation.  I spent 30 minutes with the patient today, the majority of which was spent counseling the patient on the diagnosis of cancer and coordinating care.   Jodelle Gross, M.D., Ph.D.

## 2013-05-10 ENCOUNTER — Telehealth (INDEPENDENT_AMBULATORY_CARE_PROVIDER_SITE_OTHER): Payer: Self-pay

## 2013-05-10 NOTE — Telephone Encounter (Signed)
Called pt to notify her that I am mailing her some info in the mail about the ABC class that Dr Donne Hazel recommends all of his pt's to attend to please. The pt understands.

## 2013-05-18 ENCOUNTER — Ambulatory Visit: Payer: Medicare Other | Admitting: Radiation Oncology

## 2013-05-26 ENCOUNTER — Ambulatory Visit
Admission: RE | Admit: 2013-05-26 | Discharge: 2013-05-26 | Disposition: A | Discharge status: Home / Self Care | Payer: Medicare Other | Source: Ambulatory Visit | Attending: Radiation Oncology | Admitting: Radiation Oncology

## 2013-05-26 DIAGNOSIS — Y842 Radiological procedure and radiotherapy as the cause of abnormal reaction of the patient, or of later complication, without mention of misadventure at the time of the procedure: Secondary | ICD-10-CM | POA: Insufficient documentation

## 2013-05-26 DIAGNOSIS — Z510 Encounter for antineoplastic radiation therapy: Secondary | ICD-10-CM | POA: Diagnosis not present

## 2013-05-26 DIAGNOSIS — Z79899 Other long term (current) drug therapy: Secondary | ICD-10-CM | POA: Insufficient documentation

## 2013-05-26 DIAGNOSIS — C50219 Malignant neoplasm of upper-inner quadrant of unspecified female breast: Secondary | ICD-10-CM | POA: Insufficient documentation

## 2013-05-26 DIAGNOSIS — L589 Radiodermatitis, unspecified: Secondary | ICD-10-CM | POA: Insufficient documentation

## 2013-05-26 DIAGNOSIS — C50212 Malignant neoplasm of upper-inner quadrant of left female breast: Secondary | ICD-10-CM

## 2013-05-26 DIAGNOSIS — R5381 Other malaise: Secondary | ICD-10-CM | POA: Insufficient documentation

## 2013-05-26 DIAGNOSIS — R5383 Other fatigue: Secondary | ICD-10-CM | POA: Diagnosis not present

## 2013-05-26 NOTE — Progress Notes (Addendum)
  Radiation Oncology         [phone] ________________________________  Name: [name] MRN: [id number]  Date: 05/26/2013  DOB: February 07, 1946  SIMULATION AND TREATMENT PLANNING NOTE  The patient presented for simulation prior to beginning her course of radiation treatment for her diagnosis of left-sided breast cancer. The patient was placed in a supine position on a breast board. A customized accuform device was also constructed and this complex treatment device will be used on a daily basis during her treatment. In this fashion, a CT scan was obtained through the chest area and an isocenter was placed near the chest wall within the left breast.  The patient will be planned to receive a course of radiation initially to a dose of 42.5 gray. This will consist of a whole breast radiotherapy technique. To accomplish this, 2 customized blocks have been designed which will correspond to medial and lateral whole breast tangent fields. This treatment will be accomplished at 1.8 gray per fraction. A complex isodose plan is requested to ensure that the breast target area is adequately covered dosimetrically. A forward planning technique will also be evaluated to determine if this approach improves the plan. It is anticipated that the patient will then receive a 7.5 gray boost to the seroma cavity which has been contoured. This will be accomplished at 2 gray per fraction. The final anticipated total dose therefore will correspond to 50.0 gray.  This initial treatment will consist of a 3-D conformal technique. The seroma has been contoured as the primary target structure. Additionally, dose volume histograms of both this target as well as the lungs and heart will also be evaluated. Such an approach is necessary to ensure that the target area is adequately covered while the nearby critical normal structures are adequately spared.   _______________________________   Jodelle Gross, MD, PhD   Addendum To  improve the patient's dose heterogeneity, a reduced field technique will be used for the patient's treatment. This consisted of a total of 6 medically necessary customized blocks for the initial treatment.

## 2013-05-26 NOTE — Progress Notes (Signed)
  Radiation Oncology         [phone]) [phone] ________________________________  Name: Marcelle Durand MRN: [id number]  Date: 05/26/2013  DOB: 1945-06-06  RESPIRATORY MOTION MANAGEMENT SIMULATION  NARRATIVE:  In order to account for effect of respiratory motion on target structures and other organs in the planning and delivery of radiotherapy, this patient underwent respiratory motion management simulation.  To accomplish this, when the patient was brought to the CT simulation planning suite, 4D respiratoy motion management CT images were obtained.  The CT images were loaded into the planning software.  Then, using a variety of tools including Cine, MIP, and standard views, the target volume and planning target volumes (PTV) were delineated.  Avoidance structures were contoured.  Treatment planning then occurred.  Dose volume histograms were generated and reviewed for each of the requested structure.  The resulting plan was carefully reviewed and approved today.   ------------------------------------------------  Jodelle Gross, MD, PhD

## 2013-05-27 DIAGNOSIS — Z510 Encounter for antineoplastic radiation therapy: Secondary | ICD-10-CM | POA: Diagnosis not present

## 2013-05-27 DIAGNOSIS — Z79899 Other long term (current) drug therapy: Secondary | ICD-10-CM | POA: Diagnosis not present

## 2013-05-27 DIAGNOSIS — R5381 Other malaise: Secondary | ICD-10-CM | POA: Diagnosis not present

## 2013-05-27 DIAGNOSIS — C50219 Malignant neoplasm of upper-inner quadrant of unspecified female breast: Secondary | ICD-10-CM | POA: Diagnosis not present

## 2013-05-27 DIAGNOSIS — R5383 Other fatigue: Secondary | ICD-10-CM | POA: Diagnosis not present

## 2013-05-27 DIAGNOSIS — L589 Radiodermatitis, unspecified: Secondary | ICD-10-CM | POA: Diagnosis not present

## 2013-05-30 DIAGNOSIS — R5381 Other malaise: Secondary | ICD-10-CM | POA: Diagnosis not present

## 2013-05-30 DIAGNOSIS — Z510 Encounter for antineoplastic radiation therapy: Secondary | ICD-10-CM | POA: Diagnosis not present

## 2013-05-30 DIAGNOSIS — R5383 Other fatigue: Secondary | ICD-10-CM | POA: Diagnosis not present

## 2013-05-30 DIAGNOSIS — L589 Radiodermatitis, unspecified: Secondary | ICD-10-CM | POA: Diagnosis not present

## 2013-05-30 DIAGNOSIS — Z79899 Other long term (current) drug therapy: Secondary | ICD-10-CM | POA: Diagnosis not present

## 2013-05-30 DIAGNOSIS — C50219 Malignant neoplasm of upper-inner quadrant of unspecified female breast: Secondary | ICD-10-CM | POA: Diagnosis not present

## 2013-06-02 ENCOUNTER — Ambulatory Visit: Payer: Medicare Other | Admitting: Radiation Oncology

## 2013-06-03 ENCOUNTER — Ambulatory Visit
Admission: RE | Admit: 2013-06-03 | Discharge: 2013-06-03 | Disposition: A | Discharge status: Home / Self Care | Payer: Medicare Other | Source: Ambulatory Visit | Attending: Radiation Oncology | Admitting: Radiation Oncology

## 2013-06-03 DIAGNOSIS — C50212 Malignant neoplasm of upper-inner quadrant of left female breast: Secondary | ICD-10-CM

## 2013-06-03 DIAGNOSIS — C50219 Malignant neoplasm of upper-inner quadrant of unspecified female breast: Secondary | ICD-10-CM | POA: Diagnosis not present

## 2013-06-04 ENCOUNTER — Encounter (HOSPITAL_COMMUNITY): Payer: Self-pay | Admitting: Emergency Medicine

## 2013-06-04 ENCOUNTER — Inpatient Hospital Stay (HOSPITAL_COMMUNITY)
Admission: EM | Admit: 2013-06-04 | Discharge: 2013-06-08 | DRG: 603 | Disposition: A | Discharge status: Home / Self Care | Payer: Medicare Other | Attending: Internal Medicine | Admitting: Internal Medicine

## 2013-06-04 ENCOUNTER — Emergency Department (HOSPITAL_COMMUNITY): Payer: Medicare Other

## 2013-06-04 DIAGNOSIS — I252 Old myocardial infarction: Secondary | ICD-10-CM | POA: Diagnosis present

## 2013-06-04 DIAGNOSIS — M7989 Other specified soft tissue disorders: Secondary | ICD-10-CM | POA: Diagnosis not present

## 2013-06-04 DIAGNOSIS — E1149 Type 2 diabetes mellitus with other diabetic neurological complication: Secondary | ICD-10-CM | POA: Diagnosis present

## 2013-06-04 DIAGNOSIS — D72829 Elevated white blood cell count, unspecified: Secondary | ICD-10-CM

## 2013-06-04 DIAGNOSIS — M9080 Osteopathy in diseases classified elsewhere, unspecified site: Secondary | ICD-10-CM | POA: Diagnosis present

## 2013-06-04 DIAGNOSIS — C50919 Malignant neoplasm of unspecified site of unspecified female breast: Secondary | ICD-10-CM | POA: Diagnosis present

## 2013-06-04 DIAGNOSIS — Z7982 Long term (current) use of aspirin: Secondary | ICD-10-CM | POA: Diagnosis not present

## 2013-06-04 DIAGNOSIS — L03039 Cellulitis of unspecified toe: Secondary | ICD-10-CM | POA: Diagnosis not present

## 2013-06-04 DIAGNOSIS — I129 Hypertensive chronic kidney disease with stage 1 through stage 4 chronic kidney disease, or unspecified chronic kidney disease: Secondary | ICD-10-CM | POA: Diagnosis present

## 2013-06-04 DIAGNOSIS — M109 Gout, unspecified: Secondary | ICD-10-CM

## 2013-06-04 DIAGNOSIS — L03032 Cellulitis of left toe: Secondary | ICD-10-CM

## 2013-06-04 DIAGNOSIS — N179 Acute kidney failure, unspecified: Secondary | ICD-10-CM

## 2013-06-04 DIAGNOSIS — L02612 Cutaneous abscess of left foot: Secondary | ICD-10-CM

## 2013-06-04 DIAGNOSIS — N183 Chronic kidney disease, stage 3 unspecified: Secondary | ICD-10-CM

## 2013-06-04 DIAGNOSIS — E1142 Type 2 diabetes mellitus with diabetic polyneuropathy: Secondary | ICD-10-CM | POA: Diagnosis present

## 2013-06-04 DIAGNOSIS — E1169 Type 2 diabetes mellitus with other specified complication: Secondary | ICD-10-CM | POA: Diagnosis present

## 2013-06-04 DIAGNOSIS — I998 Other disorder of circulatory system: Secondary | ICD-10-CM

## 2013-06-04 DIAGNOSIS — L03116 Cellulitis of left lower limb: Secondary | ICD-10-CM

## 2013-06-04 DIAGNOSIS — I2510 Atherosclerotic heart disease of native coronary artery without angina pectoris: Secondary | ICD-10-CM | POA: Diagnosis present

## 2013-06-04 DIAGNOSIS — E7800 Pure hypercholesterolemia, unspecified: Secondary | ICD-10-CM

## 2013-06-04 DIAGNOSIS — E119 Type 2 diabetes mellitus without complications: Secondary | ICD-10-CM | POA: Diagnosis not present

## 2013-06-04 DIAGNOSIS — I739 Peripheral vascular disease, unspecified: Secondary | ICD-10-CM

## 2013-06-04 DIAGNOSIS — I70229 Atherosclerosis of native arteries of extremities with rest pain, unspecified extremity: Secondary | ICD-10-CM

## 2013-06-04 DIAGNOSIS — M869 Osteomyelitis, unspecified: Secondary | ICD-10-CM | POA: Diagnosis present

## 2013-06-04 DIAGNOSIS — Z951 Presence of aortocoronary bypass graft: Secondary | ICD-10-CM | POA: Diagnosis present

## 2013-06-04 DIAGNOSIS — I10 Essential (primary) hypertension: Secondary | ICD-10-CM

## 2013-06-04 DIAGNOSIS — N259 Disorder resulting from impaired renal tubular function, unspecified: Secondary | ICD-10-CM

## 2013-06-04 DIAGNOSIS — I509 Heart failure, unspecified: Secondary | ICD-10-CM

## 2013-06-04 DIAGNOSIS — L97509 Non-pressure chronic ulcer of other part of unspecified foot with unspecified severity: Secondary | ICD-10-CM | POA: Diagnosis present

## 2013-06-04 DIAGNOSIS — M86179 Other acute osteomyelitis, unspecified ankle and foot: Secondary | ICD-10-CM | POA: Diagnosis not present

## 2013-06-04 DIAGNOSIS — C50219 Malignant neoplasm of upper-inner quadrant of unspecified female breast: Secondary | ICD-10-CM | POA: Diagnosis not present

## 2013-06-04 DIAGNOSIS — J438 Other emphysema: Secondary | ICD-10-CM

## 2013-06-04 DIAGNOSIS — L03119 Cellulitis of unspecified part of limb: Principal | ICD-10-CM

## 2013-06-04 DIAGNOSIS — L02619 Cutaneous abscess of unspecified foot: Principal | ICD-10-CM | POA: Diagnosis present

## 2013-06-04 DIAGNOSIS — R0602 Shortness of breath: Secondary | ICD-10-CM

## 2013-06-04 DIAGNOSIS — Z79899 Other long term (current) drug therapy: Secondary | ICD-10-CM

## 2013-06-04 DIAGNOSIS — C50212 Malignant neoplasm of upper-inner quadrant of left female breast: Secondary | ICD-10-CM

## 2013-06-04 DIAGNOSIS — S98139A Complete traumatic amputation of one unspecified lesser toe, initial encounter: Secondary | ICD-10-CM | POA: Diagnosis not present

## 2013-06-04 DIAGNOSIS — I2589 Other forms of chronic ischemic heart disease: Secondary | ICD-10-CM | POA: Diagnosis present

## 2013-06-04 DIAGNOSIS — J45909 Unspecified asthma, uncomplicated: Secondary | ICD-10-CM

## 2013-06-04 DIAGNOSIS — E785 Hyperlipidemia, unspecified: Secondary | ICD-10-CM

## 2013-06-04 DIAGNOSIS — E782 Mixed hyperlipidemia: Secondary | ICD-10-CM | POA: Diagnosis present

## 2013-06-04 DIAGNOSIS — L0390 Cellulitis, unspecified: Secondary | ICD-10-CM

## 2013-06-04 DIAGNOSIS — S92919A Unspecified fracture of unspecified toe(s), initial encounter for closed fracture: Secondary | ICD-10-CM | POA: Diagnosis not present

## 2013-06-04 DIAGNOSIS — Z794 Long term (current) use of insulin: Secondary | ICD-10-CM | POA: Diagnosis not present

## 2013-06-04 DIAGNOSIS — L97529 Non-pressure chronic ulcer of other part of left foot with unspecified severity: Secondary | ICD-10-CM

## 2013-06-04 DIAGNOSIS — I70219 Atherosclerosis of native arteries of extremities with intermittent claudication, unspecified extremity: Secondary | ICD-10-CM

## 2013-06-04 LAB — SEDIMENTATION RATE: SED RATE: 63 mm/h — AB (ref 0–22)

## 2013-06-04 LAB — CBC
HCT: 36.4 % (ref 36.0–46.0)
Hemoglobin: 12.4 g/dL (ref 12.0–15.0)
MCH: 29.8 pg (ref 26.0–34.0)
MCHC: 34.1 g/dL (ref 30.0–36.0)
MCV: 87.5 fL (ref 78.0–100.0)
Platelets: 249 10*3/uL (ref 150–400)
RBC: 4.16 MIL/uL (ref 3.87–5.11)
RDW: 13.9 % (ref 11.5–15.5)
WBC: 9.7 10*3/uL (ref 4.0–10.5)

## 2013-06-04 LAB — HEMOGLOBIN A1C
Hgb A1c MFr Bld: 10.5 % — ABNORMAL HIGH (ref <5.7)
MEAN PLASMA GLUCOSE: 255 mg/dL — AB (ref <117)

## 2013-06-04 LAB — CBC WITH DIFFERENTIAL/PLATELET
Basophils Absolute: 0 10*3/uL (ref 0.0–0.1)
Basophils Relative: 0 % (ref 0–1)
EOS ABS: 0.1 10*3/uL (ref 0.0–0.7)
Eosinophils Relative: 1 % (ref 0–5)
HEMATOCRIT: 39.6 % (ref 36.0–46.0)
Hemoglobin: 13 g/dL (ref 12.0–15.0)
Lymphocytes Relative: 20 % (ref 12–46)
Lymphs Abs: 2.3 10*3/uL (ref 0.7–4.0)
MCH: 28.9 pg (ref 26.0–34.0)
MCHC: 32.8 g/dL (ref 30.0–36.0)
MCV: 88 fL (ref 78.0–100.0)
MONO ABS: 0.8 10*3/uL (ref 0.1–1.0)
Monocytes Relative: 7 % (ref 3–12)
Neutro Abs: 8.4 10*3/uL — ABNORMAL HIGH (ref 1.7–7.7)
Neutrophils Relative %: 72 % (ref 43–77)
Platelets: 258 10*3/uL (ref 150–400)
RBC: 4.5 MIL/uL (ref 3.87–5.11)
RDW: 13.9 % (ref 11.5–15.5)
WBC: 11.6 10*3/uL — ABNORMAL HIGH (ref 4.0–10.5)

## 2013-06-04 LAB — MAGNESIUM: Magnesium: 2.1 mg/dL (ref 1.5–2.5)

## 2013-06-04 LAB — BASIC METABOLIC PANEL
BUN: 45 mg/dL — ABNORMAL HIGH (ref 6–23)
CALCIUM: 10 mg/dL (ref 8.4–10.5)
CO2: 24 meq/L (ref 19–32)
CREATININE: 1.57 mg/dL — AB (ref 0.50–1.10)
Chloride: 98 mEq/L (ref 96–112)
GFR calc Af Amer: 38 mL/min — ABNORMAL LOW (ref >90)
GFR, EST NON AFRICAN AMERICAN: 33 mL/min — AB (ref >90)
Glucose, Bld: 289 mg/dL — ABNORMAL HIGH (ref 70–99)
Potassium: 4.5 mEq/L (ref 3.7–5.3)
Sodium: 138 mEq/L (ref 137–147)

## 2013-06-04 LAB — GLUCOSE, CAPILLARY
GLUCOSE-CAPILLARY: 296 mg/dL — AB (ref 70–99)
Glucose-Capillary: 236 mg/dL — ABNORMAL HIGH (ref 70–99)

## 2013-06-04 LAB — CBG MONITORING, ED: Glucose-Capillary: 298 mg/dL — ABNORMAL HIGH (ref 70–99)

## 2013-06-04 LAB — CREATININE, SERUM
CREATININE: 1.43 mg/dL — AB (ref 0.50–1.10)
GFR calc Af Amer: 43 mL/min — ABNORMAL LOW (ref >90)
GFR calc non Af Amer: 37 mL/min — ABNORMAL LOW (ref >90)

## 2013-06-04 MED ORDER — OMEGA-3-ACID ETHYL ESTERS 1 G PO CAPS
1.0000 g | ORAL_CAPSULE | Freq: Two times a day (BID) | ORAL | Status: DC
  Administered 2013-06-04 – 2013-06-08 (×8): 1 g via ORAL
  Filled 2013-06-04 (×10): qty 1

## 2013-06-04 MED ORDER — ONDANSETRON HCL 4 MG/2ML IJ SOLN: 4.0000 mg | Freq: Four times a day (QID) | INTRAMUSCULAR | Status: DC | PRN

## 2013-06-04 MED ORDER — CLINDAMYCIN PHOSPHATE 600 MG/50ML IV SOLN
600.0000 mg | Freq: Once | INTRAVENOUS | Status: AC
  Administered 2013-06-04: 600 mg via INTRAVENOUS
  Filled 2013-06-04: qty 50

## 2013-06-04 MED ORDER — CLINDAMYCIN PHOSPHATE 600 MG/50ML IV SOLN
600.0000 mg | Freq: Three times a day (TID) | INTRAVENOUS | Status: DC
  Administered 2013-06-04 – 2013-06-08 (×11): 600 mg via INTRAVENOUS
  Filled 2013-06-04 (×14): qty 50

## 2013-06-04 MED ORDER — CARVEDILOL 12.5 MG PO TABS
12.5000 mg | ORAL_TABLET | Freq: Two times a day (BID) | ORAL | Status: DC
  Administered 2013-06-04 – 2013-06-08 (×8): 12.5 mg via ORAL
  Filled 2013-06-04 (×10): qty 1

## 2013-06-04 MED ORDER — ATORVASTATIN CALCIUM 40 MG PO TABS
40.0000 mg | ORAL_TABLET | Freq: Every day | ORAL | Status: DC
  Filled 2013-06-04: qty 1

## 2013-06-04 MED ORDER — IRBESARTAN 300 MG PO TABS
300.0000 mg | ORAL_TABLET | Freq: Every day | ORAL | Status: DC
  Administered 2013-06-05 – 2013-06-08 (×4): 300 mg via ORAL
  Filled 2013-06-04 (×4): qty 1

## 2013-06-04 MED ORDER — HEPARIN SODIUM (PORCINE) 5000 UNIT/ML IJ SOLN
5000.0000 [IU] | Freq: Three times a day (TID) | INTRAMUSCULAR | Status: DC
  Administered 2013-06-04 – 2013-06-08 (×10): 5000 [IU] via SUBCUTANEOUS
  Filled 2013-06-04 (×15): qty 1

## 2013-06-04 MED ORDER — HYDROCHLOROTHIAZIDE 25 MG PO TABS: 12.5000 mg | ORAL_TABLET | Freq: Every day | ORAL | Status: DC

## 2013-06-04 MED ORDER — VITAMIN D3 25 MCG (1000 UNIT) PO TABS
2000.0000 [IU] | ORAL_TABLET | Freq: Every day | ORAL | Status: DC
  Administered 2013-06-05 – 2013-06-08 (×4): 2000 [IU] via ORAL
  Filled 2013-06-04 (×4): qty 2

## 2013-06-04 MED ORDER — COLCHICINE 0.6 MG PO TABS
0.6000 mg | ORAL_TABLET | Freq: Every day | ORAL | Status: DC | PRN
  Filled 2013-06-04: qty 1

## 2013-06-04 MED ORDER — ASPIRIN 325 MG PO TABS
325.0000 mg | ORAL_TABLET | Freq: Every day | ORAL | Status: DC
  Administered 2013-06-04 – 2013-06-07 (×4): 325 mg via ORAL
  Filled 2013-06-04 (×6): qty 1

## 2013-06-04 MED ORDER — MORPHINE SULFATE 4 MG/ML IJ SOLN: 4.0000 mg | INTRAMUSCULAR | Status: DC | PRN

## 2013-06-04 MED ORDER — HYDROCHLOROTHIAZIDE 12.5 MG PO CAPS
12.5000 mg | ORAL_CAPSULE | Freq: Every day | ORAL | Status: DC
  Administered 2013-06-05 – 2013-06-07 (×3): 12.5 mg via ORAL
  Filled 2013-06-04 (×3): qty 1

## 2013-06-04 MED ORDER — ACETAMINOPHEN 325 MG PO TABS: 650.0000 mg | ORAL_TABLET | Freq: Four times a day (QID) | ORAL | Status: DC | PRN

## 2013-06-04 MED ORDER — ACETAMINOPHEN 650 MG RE SUPP: 650.0000 mg | Freq: Four times a day (QID) | RECTAL | Status: DC | PRN

## 2013-06-04 MED ORDER — INSULIN ASPART 100 UNIT/ML ~~LOC~~ SOLN
0.0000 [IU] | Freq: Three times a day (TID) | SUBCUTANEOUS | Status: DC
  Administered 2013-06-04: 5 [IU] via SUBCUTANEOUS
  Administered 2013-06-05: 2 [IU] via SUBCUTANEOUS
  Administered 2013-06-05 – 2013-06-06 (×3): 3 [IU] via SUBCUTANEOUS
  Administered 2013-06-06: 1 [IU] via SUBCUTANEOUS
  Administered 2013-06-06: 5 [IU] via SUBCUTANEOUS
  Administered 2013-06-07: 1 [IU] via SUBCUTANEOUS
  Administered 2013-06-07 – 2013-06-08 (×3): 3 [IU] via SUBCUTANEOUS

## 2013-06-04 MED ORDER — ROSUVASTATIN CALCIUM 20 MG PO TABS
20.0000 mg | ORAL_TABLET | Freq: Every day | ORAL | Status: DC
  Administered 2013-06-04 – 2013-06-07 (×4): 20 mg via ORAL
  Filled 2013-06-04 (×5): qty 1

## 2013-06-04 MED ORDER — OMEGA-3 FATTY ACIDS 1000 MG PO CAPS: 1.0000 g | ORAL_CAPSULE | Freq: Two times a day (BID) | ORAL | Status: DC

## 2013-06-04 MED ORDER — ALLOPURINOL 100 MG PO TABS
100.0000 mg | ORAL_TABLET | Freq: Two times a day (BID) | ORAL | Status: DC
  Administered 2013-06-04 – 2013-06-08 (×8): 100 mg via ORAL
  Filled 2013-06-04 (×10): qty 1

## 2013-06-04 MED ORDER — OXYCODONE HCL 5 MG PO TABS
5.0000 mg | ORAL_TABLET | ORAL | Status: DC | PRN
  Administered 2013-06-04: 5 mg via ORAL
  Filled 2013-06-04: qty 1

## 2013-06-04 MED ORDER — SODIUM CHLORIDE 0.9 % IV SOLN
INTRAVENOUS | Status: DC
  Administered 2013-06-04: 18:00:00 via INTRAVENOUS

## 2013-06-04 MED ORDER — INSULIN GLARGINE 100 UNIT/ML ~~LOC~~ SOLN
35.0000 [IU] | Freq: Every morning | SUBCUTANEOUS | Status: DC
  Administered 2013-06-05: 35 [IU] via SUBCUTANEOUS
  Filled 2013-06-04: qty 0.35

## 2013-06-04 NOTE — ED Notes (Signed)
Pt presents to department for evaluation of L 2nd toe pain and swelling. Pt states she noticed blister to 2nd toe several weeks ago, now states entire toe is red, sore and painful to touch. Open wound noted with bloody drainage upon arrival. Able to wiggle digit. Capillary refill less than 2 seconds. 8/10 pain.

## 2013-06-04 NOTE — Progress Notes (Signed)
  Radiation Oncology         [phone]) [phone] ________________________________  Name: [name] MRN: [id number]  Date: 06/03/2013  DOB: 27-Sep-1945  Simulation Verification Note  Status: outpatient  NARRATIVE: The patient was brought to the treatment unit and placed in the planned treatment position. The clinical setup was verified. Then port films were obtained and uploaded to the radiation oncology medical record software.  The treatment beams were carefully compared against the planned radiation fields. The position location and shape of the radiation fields was reviewed. They targeted volume of tissue appears to be appropriately covered by the radiation beams. Organs at risk appear to be excluded as planned.  Based on my personal review, I approved the simulation verification. The patient's treatment will proceed as planned.  ------------------------------------------------  Sheral Apley Tammi Klippel, M.D.

## 2013-06-04 NOTE — ED Notes (Signed)
Pt second two on left foot wraped with saline soaked gauze and bandaged with dry gauze.

## 2013-06-04 NOTE — ED Notes (Signed)
Pt returned from xray, phlebotomy in route to draw labs

## 2013-06-04 NOTE — ED Provider Notes (Signed)
CSN: [id number]     Arrival date & time 06/04/13  1140 History   First MD Initiated Contact with Patient 06/04/13 1155     Chief Complaint  Patient presents with  . Toe Pain     (Consider location/radiation/quality/duration/timing/severity/associated sxs/prior Treatment) HPI Comments: Patient is a 68 year old female with history of diabetes, hypertension, coronary artery disease, hyperlipidemia, PAD, gout, and breast cancer who presents today for left 2nd toe pain and swelling. She reports that this began approximately 2 weeks ago and has been gradually worsening. The area started as a blister and has progressed to the toe becoming red and swollen. There was no known injury to the area. She has peripheral neuropathy on the sides of her feet, but can feel her toe. It is a dull ache that does not bother her much. She denies fevers, nausea, vomiting, abdominal pain, shortness of breath, chest pain. She notes that she had chills on Thursday, but her power went out from 8am to 9pm so she is unsure if she was just cold. She tried to call Dr. Sharol Given, her orthopedic doctor yesterday, but he has surgery on Friday and was unable to get in to see him.   The history is provided by the patient. No language interpreter was used.    Past Medical History  Diagnosis Date  . Hypertension   . Coronary artery disease     NSTEMI in 05/2009;  LHC with 2 v CAD - > CABG (LIMA-LAD, SVG-diagonal, SVG-OM1/OM 2).   . Hyperlipidemia   . Ischemic cardiomyopathy     Echo 07/2009: Mild LVH, EF 40-45%, inferior and posterior HK, grade 1 diastolic dysfunction, moderate MAC, mild to moderate MR, mild LAE  . PAD (peripheral artery disease)   . Family history of anesthesia complication     Mom has a hard time to wake up  . Neuropathy     Hx; of B/L feet  . Type II diabetes mellitus   . Foot ulcer     "I've had them on both feet" (08/27/2012)  . Osteomyelitis of foot   . Sinus headache     "occasionally" (08/27/2012)  .  Arthritis     "maybe in my lower back" (08/27/2012)  . Gouty arthritis     "right index finger" (08/27/2012)  . CKD (chronic kidney disease), stage I   . 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 Allergy   . NSTEMI (non-ST elevated myocardial infarction) 05/23/2009    Archie Endo 05/23/2009 (08/27/2012)   Past Surgical History  Procedure Laterality Date  . Cataract extraction w/ intraocular lens  implant, bilateral  2000's  . Finger surgery Right     "index finger; turned out to be gout" (08/27/2012)  . Coronary artery bypass graft  2011    "CABG X4" (08/27/2012)  . Dental surgery  04/30/11    "1 implant" (08/27/2012)  . Amputation ray Right 08/27/2012    5th ray/notes 08/27/2012  . Cardiac catheterization  05/24/2009    Archie Endo 05/24/2009 (08/27/2012)  . Amputation Right 08/27/2012    Procedure: AMPUTATION RAY;  Surgeon: Newt Minion, MD;  Location: [facility];  Service: Orthopedics;  Laterality: Right;  Right Foot 5th Ray Amputation  . Eye surgery    . Colonoscopy w/ biopsies and polypectomy  2010  . Breast lumpectomy  04/20/2013    with biopsy      DR WAKEFIELD  . Breast lumpectomy with needle localization and axillary sentinel lymph  node bx Left 04/20/2013    Procedure: LEFT BREAST WIRE GUIDED LUMPECTOMY AND AXILLARY SENTINEL LYMPH NODE BX;  Surgeon: Rolm Bookbinder, MD;  Location: [facility] OR;  Service: General;  Laterality: Left;   Family History  Problem Relation Age of Onset  . Kidney cancer Brother 88  . Hypertension Brother   . Breast cancer Sister 16    LCIS; BRCA negative  . Throat cancer Father 2    smoker  . Breast cancer Maternal Aunt     dx in her 28s  . Breast cancer Sister 48    Lobular breast cancer   History  Substance Use Topics  . Smoking status: Never Smoker   . Smokeless tobacco: Never Used  . Alcohol Use: No   OB History   Grav Para Term Preterm Abortions TAB SAB Ect Mult Living                 Review of Systems   Constitutional: Positive for chills. Negative for fever.  Respiratory: Negative for shortness of breath.   Cardiovascular: Negative for chest pain.  Gastrointestinal: Negative for nausea, vomiting and abdominal pain.  Musculoskeletal: Positive for arthralgias and myalgias.  Skin: Positive for wound.  All other systems reviewed and are negative.      Allergies  Atenolol; Latex; and Omnipaque  Home Medications   Current Outpatient Rx  Name  Route  Sig  Dispense  Refill  . allopurinol (ZYLOPRIM) 100 MG tablet   Oral   Take 100 mg by mouth 2 (two) times daily.         . Alpha-Lipoic Acid 200 MG CAPS   Oral   Take 200 mg by mouth 2 (two) times daily.         Marland Kitchen anastrozole (ARIMIDEX) 1 MG tablet   Oral   Take 1 tablet (1 mg total) by mouth daily.   90 tablet   4   . aspirin 325 MG tablet   Oral   Take 325 mg by mouth at bedtime.          . carvedilol (COREG) 12.5 MG tablet   Oral   Take 1 tablet (12.5 mg total) by mouth 2 (two) times daily with a meal.   180 tablet   0   . cholecalciferol (VITAMIN D) 1000 UNITS tablet   Oral   Take 2,000 Units by mouth daily.         . Cinnamon 500 MG capsule   Oral   Take 1,000 mg by mouth 2 (two) times daily.          . Coenzyme Q10 (CO Q-10) 100 MG CAPS   Oral   Take 100 mg by mouth 2 (two) times daily.         Marland Kitchen COLCRYS 0.6 MG tablet   Oral   Take 0.6 mg by mouth daily as needed (for gout flareups).          . fish oil-omega-3 fatty acids 1000 MG capsule   Oral   Take 1 g by mouth 2 (two) times daily.          Marland Kitchen glimepiride (AMARYL) 4 MG tablet   Oral   Take 4 mg by mouth 2 (two) times daily.          . hydrochlorothiazide (HYDRODIURIL) 12.5 MG tablet   Oral   Take 12.5 mg by mouth daily.         . insulin glargine (LANTUS) 100 UNIT/ML injection   Subcutaneous  Inject 35 Units into the skin every morning.          . metformin (FORTAMET) 1000 MG (OSM) 24 hr tablet   Oral   Take 1,000  mg by mouth 2 (two) times daily with a meal.         . oxyCODONE (OXY IR/ROXICODONE) 5 MG immediate release tablet   Oral   Take 1 tablet (5 mg total) by mouth every 4 (four) hours as needed for moderate pain.   30 tablet   0   . rosuvastatin (CRESTOR) 20 MG tablet   Oral   Take 20 mg by mouth daily.         . valsartan (DIOVAN) 320 MG tablet   Oral   Take 320 mg by mouth daily.          BP 182/81  Pulse 93  Temp(Src) 98.6 F (37 C) (Oral)  Resp 16  Wt 184 lb 3.2 oz (83.553 kg)  SpO2 97% Physical Exam  Nursing note and vitals reviewed. Constitutional: She is oriented to person, place, and time. She appears well-developed and well-nourished. No distress.  HENT:  Head: Normocephalic and atraumatic.  Right Ear: External ear normal.  Left Ear: External ear normal.  Nose: Nose normal.  Mouth/Throat: Oropharynx is clear and moist.  Eyes: Conjunctivae are normal.  Neck: Normal range of motion.  Cardiovascular: Normal rate, regular rhythm and normal heart sounds.   Pulmonary/Chest: Effort normal and breath sounds normal. No stridor. No respiratory distress. She has no wheezes. She has no rales.  Abdominal: Soft. She exhibits no distension.  Musculoskeletal: Normal range of motion.  Ulcer with purulent drainage on medial aspect of 2nd left toe. Surrounding erythema throughout toe. Mild swelling to foot and left lower leg. Lymphangitis present. Patient is able to wiggle toe. Capillary refill < 3 seconds.   Neurological: She is alert and oriented to person, place, and time. She has normal strength.  Skin: Skin is warm and dry. She is not diaphoretic. No erythema.  Psychiatric: She has a normal mood and affect. Her behavior is normal.    ED Course  Procedures (including critical care time) Labs Review Labs Reviewed  CBC WITH DIFFERENTIAL - Abnormal; Notable for the following:    WBC 11.6 (*)    Neutro Abs 8.4 (*)    All other components within normal limits  BASIC  METABOLIC PANEL - Abnormal; Notable for the following:    Glucose, Bld 289 (*)    BUN 45 (*)    Creatinine, Ser 1.57 (*)    GFR calc non Af Amer 33 (*)    GFR calc Af Amer 38 (*)    All other components within normal limits  CBG MONITORING, ED - Abnormal; Notable for the following:    Glucose-Capillary 298 (*)    All other components within normal limits  CBC  CREATININE, SERUM  MAGNESIUM  TSH  HEMOGLOBIN A1C  SEDIMENTATION RATE  BASIC METABOLIC PANEL  CBC   Imaging Review Dg Toe 2nd Left  06/04/2013   CLINICAL DATA:  Blister.  Diabetes.  EXAM: LEFT SECOND TOE  COMPARISON:  11/20/2012  FINDINGS: The phalanges of the left second toe are not well profiled. However, it looks like there has been fracture and/or the mineralization of the head of the proximal phalanx, with resultant plantar subluxation of the middle phalanx and some override. The subchondral cortex of the base middle phalanx appears intact, suggesting this does not represent PIP septic arthritis.  IMPRESSION:  1. Fracture subluxation of the PIP left second toe.   Electronically Signed   By: Arne Cleveland M.D.   On: 06/04/2013 14:17     EKG Interpretation None      3:20 PM Discussed this case with PA on call for [employer]. He will see patient in am.   MDM   Final diagnoses:  None    Patient is a 68 year old diabetic female who presents today for an infection of her 2nd left toe. Hx of prior toe amputations. Gradually progressing over 2 weeks. XR shows fracture subluxation of the PIP left second toe. No concern for septic arthritis at this time. Patient was given IV clindamycin in the ED. I discussed this case with the PA on call for [facility] orthopedics. They will see the patient tomorrow morning. Discussed case with Dr. Dyann Kief who agrees to admit patient. Admission is appreciated. Vital signs stable at this time. Patient continues to decline pain medication. Dr. Eulis Foster evaluated patient and agrees with plan.  Patient / Family / Caregiver informed of clinical course, understand medical decision-making process, and agree with plan.     Elwyn Lade, PA-C 06/04/13 [phone]

## 2013-06-04 NOTE — ED Notes (Signed)
Pt left arm restricted for blood access, pt had fluids running in right arm, fluids stopped for 30 minutes before blood can be drawn.

## 2013-06-04 NOTE — H&P (Signed)
[facility] History and Physical  Shany Marinez XIP:[id number] DOB: 1945/06/23 DOA: 06/04/2013  Referring physician: Dr. Eulis Foster PCP: Alesia Richards, MD   Chief Complaint: swelling, redness, pain and open/purulent cellulitis of left second toe/foot.  HPI: [name] is a 68 y.o. female with pmh significant for HTN, HLD, DM, CKD (stage 3); left breast cancer (s/p lumpectomy and actively  Receiving radiation), CAD  (s/p CABG) and ischemic cardiomyopathy with EF 40-45%; came to ED secondary to 2 weeks of worsening swelling, pain and erythema on her left second toe. Patient reports chills and subjective fever (but difficult to tell since she experienced power outrage and expressed could just be the weather); denies CP, nausea, vomiting, diarrhea, melena, hematochezia; mechanical injuries to her foot or any other complaints.  In ED was found to have cellulitis with ongoing open blisters and purulent/senguineous discharge from her toe, elevated WBC's and x-ray demonstrating subluxation fracture. [facility] called to admit for further evaluation and treatment.  Orthopedic consulted and patient started on IV cleocin.    Review of Systems:  Negative except as otherwise mentioned on HPI.  Past Medical History  Diagnosis Date  . Hypertension   . Coronary artery disease     NSTEMI in 05/2009;  LHC with 2 v CAD - > CABG (LIMA-LAD, SVG-diagonal, SVG-OM1/OM 2).   . Hyperlipidemia   . Ischemic cardiomyopathy     Echo 07/2009: Mild LVH, EF 40-45%, inferior and posterior HK, grade 1 diastolic dysfunction, moderate MAC, mild to moderate MR, mild LAE  . PAD (peripheral artery disease)   . Family history of anesthesia complication     Mom has a hard time to wake up  . Neuropathy     Hx; of B/L feet  . Type II diabetes mellitus   . Foot ulcer     "I've had them on both feet" (08/27/2012)  . Osteomyelitis of foot   . Sinus headache     "occasionally" (08/27/2012)  . Arthritis     "maybe in my lower  back" (08/27/2012)  . Gouty arthritis     "right index finger" (08/27/2012)  . CKD (chronic kidney disease), stage I   . 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 Allergy   . NSTEMI (non-ST elevated myocardial infarction) 05/23/2009    Archie Endo 05/23/2009 (08/27/2012)   Past Surgical History  Procedure Laterality Date  . Cataract extraction w/ intraocular lens  implant, bilateral  2000's  . Finger surgery Right     "index finger; turned out to be gout" (08/27/2012)  . Coronary artery bypass graft  2011    "CABG X4" (08/27/2012)  . Dental surgery  04/30/11    "1 implant" (08/27/2012)  . Amputation ray Right 08/27/2012    5th ray/notes 08/27/2012  . Cardiac catheterization  05/24/2009    Archie Endo 05/24/2009 (08/27/2012)  . Amputation Right 08/27/2012    Procedure: AMPUTATION RAY;  Surgeon: Newt Minion, MD;  Location: [place];  Service: Orthopedics;  Laterality: Right;  Right Foot 5th Ray Amputation  . Eye surgery    . Colonoscopy w/ biopsies and polypectomy  2010  . Breast lumpectomy  04/20/2013    with biopsy      DR WAKEFIELD  . Breast lumpectomy with needle localization and axillary sentinel lymph node bx Left 04/20/2013    Procedure: LEFT BREAST WIRE GUIDED LUMPECTOMY AND AXILLARY SENTINEL LYMPH NODE BX;  Surgeon: Rolm Bookbinder, MD;  Location: [place];  Service: General;  Laterality: Left;  Social History:  reports that she has never smoked. She has never used smokeless tobacco. She reports that she does not drink alcohol or use illicit drugs.  Allergies  Allergen Reactions  . Atenolol Other (See Comments)    Exacerbates gout  . Contrast Media [Iodinated Diagnostic Agents] Rash  . Latex Rash  . Omnipaque [Iohexol] Rash    Family History  Problem Relation Age of Onset  . Kidney cancer Brother 17  . Hypertension Brother   . Breast cancer Sister 47    LCIS; BRCA negative  . Throat cancer Father 13    smoker  . Breast cancer Maternal Aunt      dx in her 31s  . Breast cancer Sister 7    Lobular breast cancer     Prior to Admission medications   Medication Sig Start Date End Date Taking? Authorizing Provider  allopurinol (ZYLOPRIM) 100 MG tablet Take 100 mg by mouth 2 (two) times daily. 10/15/11  Yes Historical Provider, MD  Alpha-Lipoic Acid 200 MG CAPS Take 200 mg by mouth 2 (two) times daily.   Yes Historical Provider, MD  aspirin 325 MG tablet Take 325 mg by mouth at bedtime.    Yes Historical Provider, MD  aspirin-acetaminophen-caffeine (EXCEDRIN MIGRAINE) [phone] MG per tablet Take 1-2 tablets by mouth every 6 (six) hours as needed (sinus headache).   Yes Historical Provider, MD  carvedilol (COREG) 12.5 MG tablet Take 1 tablet (12.5 mg total) by mouth 2 (two) times daily with a meal. 12/02/12  Yes Josue Hector, MD  cholecalciferol (VITAMIN D) 1000 UNITS tablet Take 2,000 Units by mouth daily.   Yes Historical Provider, MD  Coenzyme Q10 (CO Q-10) 100 MG CAPS Take 100 mg by mouth 2 (two) times daily.   Yes Historical Provider, MD  fish oil-omega-3 fatty acids 1000 MG capsule Take 1 g by mouth 2 (two) times daily.    Yes Historical Provider, MD  glimepiride (AMARYL) 4 MG tablet Take 4 mg by mouth 2 (two) times daily.    Yes Historical Provider, MD  hydrochlorothiazide (HYDRODIURIL) 12.5 MG tablet Take 12.5 mg by mouth daily.   Yes Historical Provider, MD  insulin glargine (LANTUS) 100 UNIT/ML injection Inject 35 Units into the skin every morning.  10/06/11  Yes Charlynne Cousins, MD  MELATONIN PO Take 1 tablet by mouth at bedtime.   Yes Historical Provider, MD  metformin (FORTAMET) 1000 MG (OSM) 24 hr tablet Take 1,000 mg by mouth 2 (two) times daily with a meal.   Yes Historical Provider, MD  oxyCODONE (OXY IR/ROXICODONE) 5 MG immediate release tablet Take 1 tablet (5 mg total) by mouth every 4 (four) hours as needed for moderate pain. 04/21/13  Yes Rolm Bookbinder, MD  Polyvinyl Alcohol-Povidone (REFRESH OP) Place 1 drop into  both eyes daily as needed (dry eyes).   Yes Historical Provider, MD  rosuvastatin (CRESTOR) 20 MG tablet Take 20 mg by mouth daily.   Yes Historical Provider, MD  valsartan (DIOVAN) 320 MG tablet Take 320 mg by mouth daily. 01/29/13  Yes Historical Provider, MD  anastrozole (ARIMIDEX) 1 MG tablet Take 1 tablet (1 mg total) by mouth daily. 04/28/13   Chauncey Cruel, MD  COLCRYS 0.6 MG tablet Take 0.6 mg by mouth daily as needed (for gout flareups).  10/15/11   Historical Provider, MD   Physical Exam: Filed Vitals:   06/04/13 1530  BP: 150/68  Pulse: 84  Temp:   Resp:     BP  150/68  Pulse 84  Temp(Src) 97.3 F (36.3 C) (Oral)  Resp 18  SpO2 94%  General:  Appears calm and comfortable; currently afebrile Eyes: PERRL, normal lids, irises & conjunctiva; no nystagmus ENT: grossly normal hearing, lips & tongue; no exudates or erythema inside her mouth Neck: no LAD, masses or thyromegaly Cardiovascular: RRR, positive SEM; no rubs or gallops. No LE edema. Respiratory: CTA bilaterally, no w/r/r. Normal respiratory effort. Abdomen: soft, nt, nd; positive BS Skin: swelling, redness and open blisters on left foot second toe dorsal aspect; spreading of erythema towards dorsal aspect of her foot. Musculoskeletal: left second toe and dorsal aspect of left foot with swelling and redness; fifth toe missing on right foot; no other abnormalities seen. FROM  Psychiatric: grossly normal mood and affect, speech fluent and appropriate Neurologic: grossly non-focal.          Labs on Admission:  Basic Metabolic Panel:  Recent Labs Lab 06/04/13 1230  NA 138  K 4.5  CL 98  CO2 24  GLUCOSE 289*  BUN 45*  CREATININE 1.57*  CALCIUM 10.0   CBC:  Recent Labs Lab 06/04/13 1230  WBC 11.6*  NEUTROABS 8.4*  HGB 13.0  HCT 39.6  MCV 88.0  PLT 258   CBG:  Recent Labs Lab 06/04/13 1212  GLUCAP 298*    Radiological Exams on Admission: Dg Toe 2nd Left  06/04/2013   CLINICAL DATA:   Blister.  Diabetes.  EXAM: LEFT SECOND TOE  COMPARISON:  11/20/2012  FINDINGS: The phalanges of the left second toe are not well profiled. However, it looks like there has been fracture and/or the mineralization of the head of the proximal phalanx, with resultant plantar subluxation of the middle phalanx and some override. The subchondral cortex of the base middle phalanx appears intact, suggesting this does not represent PIP septic arthritis.  IMPRESSION: 1. Fracture subluxation of the PIP left second toe.   Electronically Signed   By: Arne Cleveland M.D.   On: 06/04/2013 14:17    Assessment/Plan 1-left foot second toe Cellulitis: with signs of swelling and spreading into dorsal aspect of her foot.  -will admit and treat with IV clecocin -orthopedic service contacted; patient with hx of previous toe amputation. Will like orientation for needs of surgical debridement. -supportive care, PRN meds and antipyretics -x-ray no showing osteomyelitis, but positive for  subluxation fracture. Patient denies pain. -will wait ortho evaluation before asking wound care service rec's -will check ESR and repeat CBC in am.    2-DM: will check A1C. -hold amaryl and metformin while inpatient -continue lantus and SSI  3-HYPERLIPIDEMIA: continue statin  4-HYPERTENSION:slightly elevated on admission; most likely due to pain. -will resume home regimen and follow VS for further medication adjustments  5-CAD: s/p CABG; no CP or SOB. -continue home regimen.  6-CKD (chronic kidney disease) stage 3, GFR 30-59 ml/min: -stable and at baseline currently -will continue monitoring renal function  7-Leukocytosis: due to #1. Will follow WBC's trend. Continue antibiotic therapy.  8-ischemic cardiomyopathy: EF 40-45%. Compensated currently. -close follow up to Intake and Output  -daily weight  9-hx of breast cancer: status post lumpectomy (left breast) -currently receiving radiation -plan is to start arimidex once  radiation is completed -follow up with oncology as an outpatient  10-hx of Gout: continue colcrys and allopurinol. No acute flare on exam.     DVT: heparin  Orthopedic service: ([facility]; patient follow by Dr. Sharol Given)  Code Status: Full Family Communication: husband at bedside Disposition  Plan: LOS >2 midnights; med-surg; inpatient  Time spent: 59 minutes  Anahi Belmar [facility] Pager [phone]

## 2013-06-04 NOTE — ED Notes (Signed)
Attempted to draw labs with IV stick, unsuccessful. Phlebotomy notified.

## 2013-06-04 NOTE — ED Provider Notes (Signed)
  Face-to-face evaluation   History: She has had left second toe pain and swelling for 6 weeks. It has gotten worse. She has not been following her blood sugars at home. 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 There is proximal erythema, consistent with lymphangitis spreading to the knee. There is no left popliteal, tenderness or swelling.  Medical screening examination/treatment/procedure(s) were conducted as a shared visit with non-physician practitioner(s) and myself.  I personally evaluated the patient during the encounter  Richarda Blade, MD 06/05/13 [phone]

## 2013-06-05 LAB — GLUCOSE, CAPILLARY
GLUCOSE-CAPILLARY: 239 mg/dL — AB (ref 70–99)
GLUCOSE-CAPILLARY: 235 mg/dL — AB (ref 70–99)
Glucose-Capillary: 189 mg/dL — ABNORMAL HIGH (ref 70–99)
Glucose-Capillary: 229 mg/dL — ABNORMAL HIGH (ref 70–99)

## 2013-06-05 LAB — CBC
HCT: 35.3 % — ABNORMAL LOW (ref 36.0–46.0)
HEMOGLOBIN: 11.6 g/dL — AB (ref 12.0–15.0)
MCH: 29.1 pg (ref 26.0–34.0)
MCHC: 32.9 g/dL (ref 30.0–36.0)
MCV: 88.7 fL (ref 78.0–100.0)
PLATELETS: 231 10*3/uL (ref 150–400)
RBC: 3.98 MIL/uL (ref 3.87–5.11)
RDW: 14 % (ref 11.5–15.5)
WBC: 8.9 10*3/uL (ref 4.0–10.5)

## 2013-06-05 LAB — BASIC METABOLIC PANEL
BUN: 39 mg/dL — AB (ref 6–23)
CALCIUM: 8.7 mg/dL (ref 8.4–10.5)
CO2: 22 mEq/L (ref 19–32)
Chloride: 93 mEq/L — ABNORMAL LOW (ref 96–112)
Creatinine, Ser: 1.45 mg/dL — ABNORMAL HIGH (ref 0.50–1.10)
GFR, EST AFRICAN AMERICAN: 42 mL/min — AB (ref >90)
GFR, EST NON AFRICAN AMERICAN: 36 mL/min — AB (ref >90)
Glucose, Bld: 493 mg/dL — ABNORMAL HIGH (ref 70–99)
Potassium: 4.3 mEq/L (ref 3.7–5.3)
Sodium: 133 mEq/L — ABNORMAL LOW (ref 137–147)

## 2013-06-05 LAB — TSH: TSH: 1.393 u[IU]/mL (ref 0.350–4.500)

## 2013-06-05 MED ORDER — INSULIN GLARGINE 100 UNIT/ML ~~LOC~~ SOLN
3.0000 [IU] | Freq: Once | SUBCUTANEOUS | Status: AC
  Administered 2013-06-05: 3 [IU] via SUBCUTANEOUS
  Filled 2013-06-05: qty 0.03

## 2013-06-05 MED ORDER — INSULIN GLARGINE 100 UNIT/ML ~~LOC~~ SOLN
38.0000 [IU] | Freq: Every morning | SUBCUTANEOUS | Status: DC
  Administered 2013-06-06 – 2013-06-08 (×3): 38 [IU] via SUBCUTANEOUS
  Filled 2013-06-05 (×4): qty 0.38

## 2013-06-05 NOTE — Progress Notes (Signed)
[facility] PROGRESS NOTE  [name] OZH:[id number] DOB: July 21, 1945 DOA: 06/04/2013 PCP: Alesia Richards, MD  Assessment/Plan: #1. Cellulitis/?osteomyelitis 2nd left toe ESR elevated. Patient has been seen by orthopedics in a few patient likely has osteomyelitis. Wound cultures have been obtained. Continue IV clindamycin. Orthopedics recommended amputation in the next few days.  #2 diabetes mellitus Hemoglobin A1c 10.5.CBGs every from 189- 239. Increase Lantus to 38 units daily. Continue sliding scale insulin.  #3  Hypertension Continue Avapro, HCTZ,Coreg.  #4 coronary artery disease status post CABG Stable. Continue Avapro, Coreg, HCTZ, aspirin.  #5 chronic kidney disease stage III Stable.  #6 hyperlipidemia Continue Lovaza, crestor.  #7 history of breast cancer status post lumpectomy Patient currently receiving radiation therapy. I have informed radiation oncology of patient's admission the probable radiation treatments while in-house.  #8 history of gout Stable. Continue allopurinol and culprits.  #9 prophylaxis Heparin for DVT prophylaxis.  Code Status: Full Family Communication: Updated patient. Disposition Plan: Home when medically stable.   Consultants:  Orthopedics:06/05/2013  Procedures:  X-ray left second toe 06/04/2013  Antibiotics:  IV clindamycin 06/04/2013  HPI/Subjective: Patient with no complaints.  Objective: Filed Vitals:   06/05/13 0500  BP: 136/71  Pulse: 85  Temp: 97.8 F (36.6 C)  Resp: 16    Intake/Output Summary (Last 24 hours) at 06/05/13 0958 Last data filed at 06/05/13 0738  Gross per 24 hour  Intake    480 ml  Output      2 ml  Net    478 ml   Filed Weights   06/04/13 1700 06/05/13 0500  Weight: 83.553 kg (184 lb 3.2 oz) 83.235 kg (183 lb 8 oz)    Exam:   General:  NAD  Cardiovascular: RRR  Respiratory: CTAB  Abdomen: SOFT, NONTENDER, NONDISTENDED, POSITIVE BOWEL  SOUNDS.  Musculoskeletal:mild edema dorsum of the left foot with mild erythema. Left second toe with a draining ulcer on the medial side which is erythematous and swollen.  Data Reviewed: Basic Metabolic Panel:  Recent Labs Lab 06/04/13 1230 06/04/13 1855 06/05/13 0527  NA 138  --  133*  K 4.5  --  4.3  CL 98  --  93*  CO2 24  --  22  GLUCOSE 289*  --  493*  BUN 45*  --  39*  CREATININE 1.57* 1.43* 1.45*  CALCIUM 10.0  --  8.7  MG  --  2.1  --    Liver Function Tests: No results found for this basename: AST, ALT, ALKPHOS, BILITOT, PROT, ALBUMIN,  in the last 168 hours No results found for this basename: LIPASE, AMYLASE,  in the last 168 hours No results found for this basename: AMMONIA,  in the last 168 hours CBC:  Recent Labs Lab 06/04/13 1230 06/04/13 1855 06/05/13 0527  WBC 11.6* 9.7 8.9  NEUTROABS 8.4*  --   --   HGB 13.0 12.4 11.6*  HCT 39.6 36.4 35.3*  MCV 88.0 87.5 88.7  PLT 258 249 231   Cardiac Enzymes: No results found for this basename: CKTOTAL, CKMB, CKMBINDEX, TROPONINI,  in the last 168 hours BNP (last 3 results) No results found for this basename: PROBNP,  in the last 8760 hours CBG:  Recent Labs Lab 06/04/13 1212 06/04/13 1750 06/04/13 2148 06/05/13 0640  GLUCAP 298* 296* 236* 189*    No results found for this or any previous visit (from the past 240 hour(s)).   Studies: Dg Toe 2nd Left  06/04/2013   CLINICAL DATA:  Blister.  Diabetes.  EXAM: LEFT SECOND TOE  COMPARISON:  11/20/2012  FINDINGS: The phalanges of the left second toe are not well profiled. However, it looks like there has been fracture and/or the mineralization of the head of the proximal phalanx, with resultant plantar subluxation of the middle phalanx and some override. The subchondral cortex of the base middle phalanx appears intact, suggesting this does not represent PIP septic arthritis.  IMPRESSION: 1. Fracture subluxation of the PIP left second toe.   Electronically Signed    By: Arne Cleveland M.D.   On: 06/04/2013 14:17    Scheduled Meds: . allopurinol  100 mg Oral BID  . aspirin  325 mg Oral QHS  . carvedilol  12.5 mg Oral BID WC  . cholecalciferol  2,000 Units Oral Daily  . clindamycin (CLEOCIN) IV  600 mg Intravenous Q8H  . heparin  5,000 Units Subcutaneous 3 times per day  . hydrochlorothiazide  12.5 mg Oral Daily  . insulin aspart  0-9 Units Subcutaneous TID WC  . insulin glargine  35 Units Subcutaneous q morning - 10a  . irbesartan  300 mg Oral Daily  . omega-3 acid ethyl esters  1 g Oral BID  . rosuvastatin  20 mg Oral q1800   Continuous Infusions: . sodium chloride 20 mL/hr at 06/04/13 1752    Principal Problem:   Cellulitis Active Problems:   DM   HYPERLIPIDEMIA   HYPERTENSION   CAD   CKD (chronic kidney disease) stage 3, GFR 30-59 ml/min   Leukocytosis   Cellulitis and abscess of toe of left foot    Time spent: 53 minutes    THOMPSON,DANIEL M.D. [facility] Pager [phone]. If 7PM-7AM, please contact night-coverage at [email or web address], password [facility] 06/05/2013, 9:58 AM  LOS: 1 day

## 2013-06-05 NOTE — Consult Note (Signed)
Pamela Fears, MD                  Biagio Borg, PA-C   [address], [place], [place]  [address]   ORTHOPAEDIC CONSULTATION  [name]            MRN:  [id number] DOB/SEX:  Feb 09, 1946/female    REQUESTING PHYSICIAN: Dr Grandville Silos (Hospitalist)   CHIEF COMPLAINT:  Painful left second toe  HISTORY: [name] a 69 y.o. female with multiple medical problems including DM and recently diagnosed breast CA has had a progressively swollen erythematous left 2nd toe for 2 weeks which started as a blister from the great toe overlap.  This worsened to the point she was seen in ED with a pathologic (osteomyelitis) fracture of the distal/proximal phalanx.  Admitted for culture and IV antibiotics and requested Ortho Evaluation.   PAST MEDICAL HISTORY: Patient Active Problem List   Diagnosis Date Noted  . Cellulitis 06/04/2013  . CKD (chronic kidney disease) stage 3, GFR 30-59 ml/min 06/04/2013  . Leukocytosis 06/04/2013  . Cellulitis and abscess of toe of left foot 06/04/2013  . Breast cancer of upper-inner quadrant of left female breast 02/14/2013  . Critical lower limb ischemia 10/24/2011  . Cellulitis of left lower extremity 10/05/2011  . Acute kidney injury 10/05/2011  . Foot ulcer, left 10/04/2011  . Atherosclerosis of native arteries of the extremities with intermittent claudication 03/14/2011  . PAD (peripheral artery disease) 10/17/2010  . CLAUDICATION 09/05/2009  . DM 06/20/2009  . HYPERCHOLESTEROLEMIA 06/20/2009  . HYPERLIPIDEMIA 06/20/2009  . GOUT 06/20/2009  . HYPERTENSION 06/20/2009  . CAD 06/20/2009  . CHF 06/20/2009  . EMPHYSEMA 06/20/2009  . ASTHMA 06/20/2009  . RENAL INSUFFICIENCY 06/20/2009  . SHORTNESS OF BREATH 06/20/2009   Past Medical History  Diagnosis Date  . Hypertension   . Coronary artery disease     NSTEMI in 05/2009;  LHC with 2 v CAD - > CABG (LIMA-LAD, SVG-diagonal, SVG-OM1/OM 2).   . Hyperlipidemia   . Ischemic cardiomyopathy    Echo 07/2009: Mild LVH, EF 40-45%, inferior and posterior HK, grade 1 diastolic dysfunction, moderate MAC, mild to moderate MR, mild LAE  . PAD (peripheral artery disease)   . Family history of anesthesia complication     Mom has a hard time to wake up  . Neuropathy     Hx; of B/L feet  . Type II diabetes mellitus   . Foot ulcer     "I've had them on both feet" (08/27/2012)  . Osteomyelitis of foot   . Sinus headache     "occasionally" (08/27/2012)  . Arthritis     "maybe in my lower back" (08/27/2012)  . Gouty arthritis     "right index finger" (08/27/2012)  . CKD (chronic kidney disease), stage I   . 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 Allergy   . NSTEMI (non-ST elevated myocardial infarction) 05/23/2009    Archie Endo 05/23/2009 (08/27/2012)   Past Surgical History  Procedure Laterality Date  . Cataract extraction w/ intraocular lens  implant, bilateral  2000's  . Finger surgery Right     "index finger; turned out to be gout" (08/27/2012)  . Coronary artery bypass graft  2011    "CABG X4" (08/27/2012)  . Dental surgery  04/30/11    "1 implant" (08/27/2012)  . Amputation ray Right 08/27/2012    5th ray/notes 08/27/2012  . Cardiac catheterization  05/24/2009    Archie Endo  05/24/2009 (08/27/2012)  . Amputation Right 08/27/2012    Procedure: AMPUTATION RAY;  Surgeon: Newt Minion, MD;  Location: [facility];  Service: Orthopedics;  Laterality: Right;  Right Foot 5th Ray Amputation  . Eye surgery    . Colonoscopy w/ biopsies and polypectomy  2010  . Breast lumpectomy  04/20/2013    with biopsy      DR WAKEFIELD  . Breast lumpectomy with needle localization and axillary sentinel lymph node bx Left 04/20/2013    Procedure: LEFT BREAST WIRE GUIDED LUMPECTOMY AND AXILLARY SENTINEL LYMPH NODE BX;  Surgeon: Rolm Bookbinder, MD;  Location: [name];  Service: General;  Laterality: Left;     MEDICATIONS:  Current facility-administered medications:0.9 %  sodium  chloride infusion, , Intravenous, Continuous, Barton Dubois, MD, Last Rate: 20 mL/hr at 06/04/13 1752;  acetaminophen (TYLENOL) suppository 650 mg, 650 mg, Rectal, Q6H PRN, Barton Dubois, MD;  acetaminophen (TYLENOL) tablet 650 mg, 650 mg, Oral, Q6H PRN, Barton Dubois, MD;  allopurinol (ZYLOPRIM) tablet 100 mg, 100 mg, Oral, BID, Barton Dubois, MD, 100 mg at 06/05/13 1047 aspirin tablet 325 mg, 325 mg, Oral, QHS, Barton Dubois, MD, 325 mg at 06/04/13 2126;  carvedilol (COREG) tablet 12.5 mg, 12.5 mg, Oral, BID WC, Barton Dubois, MD, 12.5 mg at 06/05/13 1660;  cholecalciferol (VITAMIN D) tablet 2,000 Units, 2,000 Units, Oral, Daily, Barton Dubois, MD, 2,000 Units at 06/05/13 1047;  clindamycin (CLEOCIN) IVPB 600 mg, 600 mg, Intravenous, Q8H, Barton Dubois, MD, 600 mg at 06/05/13 0423 colchicine tablet 0.6 mg, 0.6 mg, Oral, Daily PRN, Barton Dubois, MD;  heparin injection 5,000 Units, 5,000 Units, Subcutaneous, 3 times per day, Barton Dubois, MD, 5,000 Units at 06/05/13 6301;  hydrochlorothiazide (MICROZIDE) capsule 12.5 mg, 12.5 mg, Oral, Daily, Barton Dubois, MD, 12.5 mg at 06/05/13 1047;  insulin aspart (novoLOG) injection 0-9 Units, 0-9 Units, Subcutaneous, TID WC, Barton Dubois, MD, 2 Units at 06/05/13 0659 insulin glargine (LANTUS) injection 35 Units, 35 Units, Subcutaneous, q morning - 10a, Barton Dubois, MD, 35 Units at 06/05/13 1047;  irbesartan (AVAPRO) tablet 300 mg, 300 mg, Oral, Daily, Barton Dubois, MD, 300 mg at 06/05/13 1047;  morphine 4 MG/ML injection 4 mg, 4 mg, Intravenous, Q4H PRN, Barton Dubois, MD;  omega-3 acid ethyl esters (LOVAZA) capsule 1 g, 1 g, Oral, BID, Barton Dubois, MD, 1 g at 06/04/13 2126 ondansetron [facility]) injection 4 mg, 4 mg, Intravenous, Q6H PRN, Dianne Dun, NP;  oxyCODONE (Oxy IR/ROXICODONE) immediate release tablet 5 mg, 5 mg, Oral, Q4H PRN, Barton Dubois, MD, 5 mg at 06/04/13 1826;  rosuvastatin (CRESTOR) tablet 20 mg, 20 mg, Oral, q1800, Barton Dubois, MD,  20 mg at 06/04/13 1826  ALLERGIES:   Allergies  Allergen Reactions  . Atenolol Other (See Comments)    Exacerbates gout  . Contrast Media [Iodinated Diagnostic Agents] Rash  . Latex Rash  . Omnipaque [Iohexol] Rash    REVIEW OF SYSTEMS: REVIEWED IN DETAIL IN CHART See hospitalist note  FAMILY HISTORY:   Family History  Problem Relation Age of Onset  . Kidney cancer Brother 47  . Hypertension Brother   . Breast cancer Sister 31    LCIS; BRCA negative  . Throat cancer Father 40    smoker  . Breast cancer Maternal Aunt     dx in her 73s  . Breast cancer Sister 92    Lobular breast cancer    SOCIAL HISTORY:   History  Substance Use Topics  . Smoking status: Never Smoker   .  Smokeless tobacco: Never Used  . Alcohol Use: No     EXAMINATION: Vital signs in last 24 hours: Temp:  [97.3 F (36.3 C)-98.8 F (37.1 C)] 97.8 F (36.6 C) (03/01 0500) Pulse Rate:  [81-93] 85 (03/01 0500) Resp:  [16-18] 16 (03/01 0500) BP: (136-196)/(61-90) 136/71 mmHg (03/01 0500) SpO2:  [94 %-100 %] 99 % (03/01 0500) Weight:  [83.235 kg (183 lb 8 oz)-83.553 kg (184 lb 3.2 oz)] 83.235 kg (183 lb 8 oz) (03/01 0500)    Musculoskeletal Exam  :Mild edema dorsum left foot with mild erythema subjectively improved since admission and IV antibx.  Drain ng ulcer medial side PIP left 2nd toe.  2nd toe entirely swollen and erythematous.  Decreased sensibility related to neuropathy.  DIAGNOSTIC STUDIES: Recent laboratory studies:  Recent Labs  06/04/13 1230 06/04/13 1855 06/05/13 0527  WBC 11.6* 9.7 8.9  HGB 13.0 12.4 11.6*  HCT 39.6 36.4 35.3*  PLT 258 249 231    Recent Labs  06/04/13 1230 06/04/13 1855 06/05/13 0527  NA 138  --  133*  K 4.5  --  4.3  CL 98  --  93*  CO2 24  --  22  BUN 45*  --  39*  CREATININE 1.57* 1.43* 1.45*  GLUCOSE 289*  --  493*  CALCIUM 10.0  --  8.7   Lab Results  Component Value Date   INR 1.00 08/27/2012   INR 0.9 11/08/2010   INR 1.44 05/28/2009      Recent Radiographic Studies :  Dg Toe 2nd Left  06/04/2013   CLINICAL DATA:  Blister.  Diabetes.  EXAM: LEFT SECOND TOE  COMPARISON:  11/20/2012  FINDINGS: The phalanges of the left second toe are not well profiled. However, it looks like there has been fracture and/or the mineralization of the head of the proximal phalanx, with resultant plantar subluxation of the middle phalanx and some override. The subchondral cortex of the base middle phalanx appears intact, suggesting this does not represent PIP septic arthritis.  IMPRESSION: 1. Fracture subluxation of the PIP left second toe.   Electronically Signed   By: Arne Cleveland M.D.   On: 06/04/2013 14:17    ASSESSMENT: Osteomyelitis left 2nd toe proximal phalanx with cellulitis   PLAN: Drainage cultured IV Antibx (Cleocin) Most likely will need amputation 2nd toe during this admission Once toe stabilizes.  Possible amputation Tuesday.  Shishir Krantz 06/05/2013, 10:48 AM

## 2013-06-06 ENCOUNTER — Telehealth: Payer: Self-pay | Admitting: *Deleted

## 2013-06-06 ENCOUNTER — Ambulatory Visit
Admission: RE | Admit: 2013-06-06 | Payer: Medicare Other | Source: Ambulatory Visit | Attending: Radiation Oncology | Admitting: Radiation Oncology

## 2013-06-06 ENCOUNTER — Ambulatory Visit: Payer: Medicare Other

## 2013-06-06 LAB — CBC
HCT: 38.8 % (ref 36.0–46.0)
Hemoglobin: 12.7 g/dL (ref 12.0–15.0)
MCH: 28.9 pg (ref 26.0–34.0)
MCHC: 32.7 g/dL (ref 30.0–36.0)
MCV: 88.4 fL (ref 78.0–100.0)
PLATELETS: 244 10*3/uL (ref 150–400)
RBC: 4.39 MIL/uL (ref 3.87–5.11)
RDW: 14.1 % (ref 11.5–15.5)
WBC: 7 10*3/uL (ref 4.0–10.5)

## 2013-06-06 LAB — BASIC METABOLIC PANEL
BUN: 49 mg/dL — AB (ref 6–23)
CO2: 26 meq/L (ref 19–32)
CREATININE: 1.65 mg/dL — AB (ref 0.50–1.10)
Calcium: 10.2 mg/dL (ref 8.4–10.5)
Chloride: 100 mEq/L (ref 96–112)
GFR calc Af Amer: 36 mL/min — ABNORMAL LOW (ref >90)
GFR, EST NON AFRICAN AMERICAN: 31 mL/min — AB (ref >90)
GLUCOSE: 144 mg/dL — AB (ref 70–99)
Potassium: 4.6 mEq/L (ref 3.7–5.3)
Sodium: 138 mEq/L (ref 137–147)

## 2013-06-06 LAB — GLUCOSE, CAPILLARY
Glucose-Capillary: 141 mg/dL — ABNORMAL HIGH (ref 70–99)
Glucose-Capillary: 206 mg/dL — ABNORMAL HIGH (ref 70–99)
Glucose-Capillary: 294 mg/dL — ABNORMAL HIGH (ref 70–99)
Glucose-Capillary: 129 mg/dL — ABNORMAL HIGH (ref 70–99)

## 2013-06-06 MED ORDER — DOCUSATE SODIUM 100 MG PO CAPS
100.0000 mg | ORAL_CAPSULE | Freq: Two times a day (BID) | ORAL | Status: DC
  Administered 2013-06-06 – 2013-06-08 (×4): 100 mg via ORAL
  Filled 2013-06-06 (×5): qty 1

## 2013-06-06 MED ORDER — POLYETHYLENE GLYCOL 3350 17 G PO PACK: 17.0000 g | PACK | Freq: Every day | ORAL | Status: DC | PRN

## 2013-06-06 MED ORDER — INSULIN ASPART 100 UNIT/ML ~~LOC~~ SOLN
4.0000 [IU] | Freq: Three times a day (TID) | SUBCUTANEOUS | Status: DC
  Administered 2013-06-06 – 2013-06-08 (×7): 4 [IU] via SUBCUTANEOUS

## 2013-06-06 NOTE — Progress Notes (Signed)
Inpatient Diabetes Program Recommendations  AACE/ADA: New Consensus Statement on Inpatient Glycemic Control (2013)  Target Ranges:  Prepandial:   less than 140 mg/dL      Peak postprandial:   less than 180 mg/dL (1-2 hours)      Critically ill patients:  140 - 180 mg/dL   Reason for Visit: Results for KIMARIA, DEFRANCE (MRN [id number]) as of 06/06/2013 10:08  Ref. Range 06/05/2013 11:14 06/05/2013 16:37 06/05/2013 21:31 06/06/2013 06:31  Glucose-Capillary Latest Range: 70-99 mg/dL 239 (H) 229 (H) 235 (H) 141 (H)  Results for CARLYON, PRISBREY (MRN [id number]) as of 06/06/2013 10:08  Ref. Range 06/04/2013 18:55  Hemoglobin A1C Latest Range: <5.7 % 10.5 (H)   Diabetes history: Type 2 diabetes Outpatient Diabetes medications: Amaryl 4 mg bid, Lantus 35 units q HS, Metformin1000 mg bid Current orders for Inpatient glycemic control: Lantus 38 untis daily, Novolog sensitive tid with meals  Please consider adding Novolog meal coverage 4 units tid with meals (Hold if patient eats less than 50%).  Thanks,  Adah Perl, RN, BC-ADM Inpatient Diabetes Coordinator Pager [phone]

## 2013-06-06 NOTE — Progress Notes (Signed)
Subjective:   Procedure(s) (LRB): AMPUTATION DIGIT (Left) Patient reports pain as mild.    Objective: Vital signs in last 24 hours: Temp:  [97.8 F (36.6 C)-98.1 F (36.7 C)] 97.8 F (36.6 C) (03/02 0628) Pulse Rate:  [79-80] 80 (03/02 0628) Resp:  [16] 16 (03/02 0628) BP: (135-159)/(63-76) 159/76 mmHg (03/02 0628) SpO2:  [98 %-100 %] 100 % (03/02 0628) Weight:  [82.056 kg (180 lb 14.4 oz)] 82.056 kg (180 lb 14.4 oz) (03/02 0500)  Intake/Output from previous day: 03/01 0701 - 03/02 0700 In: 1180 [P.O.:1080; IV Piggyback:100] Out: -  Intake/Output this shift: Total I/O In: 240 [P.O.:240] Out: -    Recent Labs  06/04/13 1230 06/04/13 1855 06/05/13 0527 06/06/13 0725  HGB 13.0 12.4 11.6* 12.7    Recent Labs  06/05/13 0527 06/06/13 0725  WBC 8.9 7.0  RBC 3.98 4.39  HCT 35.3* 38.8  PLT 231 244    Recent Labs  06/05/13 0527 06/06/13 0725  NA 133* 138  K 4.3 4.6  CL 93* 100  CO2 22 26  BUN 39* 49*  CREATININE 1.45* 1.65*  GLUCOSE 493* 144*  CALCIUM 8.7 10.2   No results found for this basename: LABPT, INR,  in the last 72 hours  Erythema left second toe decreased-minimal swelling and no erythema dorsum of foot-less drainage from 2nd toe ulcer-cultures not ready yet but does demonstrate gram pos cocci in pairs  Assessment/Plan:   Procedure(s) (LRB): AMPUTATION DIGIT (Left) I believe she will need amp of second toe-Dr Sharol Given has cared for her in past so I will call him  Garald Balding 06/06/2013, 12:36 PM

## 2013-06-06 NOTE — Progress Notes (Signed)
[facility] PROGRESS NOTE  [name] KYH:[id number] DOB: [birth year] DOA: 06/04/2013 PCP: Alesia Richards, MD  Assessment/Plan: #1. Cellulitis/?osteomyelitis 2nd left toe ESR elevated. Patient has been seen by orthopedics who feels patient likely has osteomyelitis. Wound cultures have been obtained and  pending. Continue IV clindamycin. Orthopedics recommended amputation in the next few days. Per orthopedics.  #2 diabetes mellitus Hemoglobin A1c 10.5.CBGs every from 141- 294. Continue Lantus 38 units daily. Add NovoLog 4 units 3 times daily with meals. Continue sliding scale insulin.  #3  Hypertension Continue Avapro, HCTZ,Coreg.  #4 coronary artery disease status post CABG Stable. Continue Avapro, Coreg, HCTZ, aspirin.  #5 chronic kidney disease stage III Stable.  #6 hyperlipidemia Continue Lovaza, crestor.  #7 history of breast cancer status post lumpectomy Patient currently receiving radiation therapy. I have informed radiation oncology of patient's admission.   #8 history of gout Stable. Continue allopurinol and culprits.  #9 prophylaxis Heparin for DVT prophylaxis.  Code Status: Full Family Communication: Updated patient. Disposition Plan: Home when medically stable.   Consultants:  Orthopedics:06/05/2013  Procedures:  X-ray left second toe 06/04/2013  Antibiotics:  IV clindamycin 06/04/2013  HPI/Subjective: Patient with no complaints.  Objective: Filed Vitals:   06/06/13 0628  BP: 159/76  Pulse: 80  Temp: 97.8 F (36.6 C)  Resp: 16    Intake/Output Summary (Last 24 hours) at 06/06/13 1239 Last data filed at 06/06/13 0904  Gross per 24 hour  Intake   1060 ml  Output      0 ml  Net   1060 ml   Filed Weights   06/04/13 1700 06/05/13 0500 06/06/13 0500  Weight: 83.553 kg (184 lb 3.2 oz) 83.235 kg (183 lb 8 oz) 82.056 kg (180 lb 14.4 oz)    Exam:   General:  NAD  Cardiovascular: RRR  Respiratory: CTAB  Abdomen: SOFT,  NONTENDER, NONDISTENDED, POSITIVE BOWEL SOUNDS.  Musculoskeletal:mild edema dorsum of the left foot with mild erythema. Left second toe with decreased a draining ulcer on the medial side which is less erythematous and swollen.  Data Reviewed: Basic Metabolic Panel:  Recent Labs Lab 06/04/13 1230 06/04/13 1855 06/05/13 0527 06/06/13 0725  NA 138  --  133* 138  K 4.5  --  4.3 4.6  CL 98  --  93* 100  CO2 24  --  22 26  GLUCOSE 289*  --  493* 144*  BUN 45*  --  39* 49*  CREATININE 1.57* 1.43* 1.45* 1.65*  CALCIUM 10.0  --  8.7 10.2  MG  --  2.1  --   --    Liver Function Tests: No results found for this basename: AST, ALT, ALKPHOS, BILITOT, PROT, ALBUMIN,  in the last 168 hours No results found for this basename: LIPASE, AMYLASE,  in the last 168 hours No results found for this basename: AMMONIA,  in the last 168 hours CBC:  Recent Labs Lab 06/04/13 1230 06/04/13 1855 06/05/13 0527 06/06/13 0725  WBC 11.6* 9.7 8.9 7.0  NEUTROABS 8.4*  --   --   --   HGB 13.0 12.4 11.6* 12.7  HCT 39.6 36.4 35.3* 38.8  MCV 88.0 87.5 88.7 88.4  PLT 258 249 231 244   Cardiac Enzymes: No results found for this basename: CKTOTAL, CKMB, CKMBINDEX, TROPONINI,  in the last 168 hours BNP (last 3 results) No results found for this basename: PROBNP,  in the last 8760 hours CBG:  Recent Labs Lab 06/05/13 1114 06/05/13 1637 06/05/13 2131 06/06/13 0631 06/06/13  [place]* 141* 206*    Recent Results (from the past 240 hour(s))  WOUND CULTURE     Status: None   Collection Time    06/05/13 10:48 AM      Result Value Ref Range Status   Specimen Description WOUND LEFT TOE   Final   Special Requests SECOND TOE   Final   Gram Stain     Final   Value: RARE WBC PRESENT, PREDOMINANTLY MONONUCLEAR     RARE SQUAMOUS EPITHELIAL CELLS PRESENT     RARE GRAM POSITIVE COCCI IN PAIRS     Performed at [facility]   Culture     Final   Value: Culture reincubated for better  growth     Performed at [facility]   Report Status PENDING   Incomplete     Studies: Dg Toe 2nd Left  06/04/2013   CLINICAL DATA:  Blister.  Diabetes.  EXAM: LEFT SECOND TOE  COMPARISON:  11/20/2012  FINDINGS: The phalanges of the left second toe are not well profiled. However, it looks like there has been fracture and/or the mineralization of the head of the proximal phalanx, with resultant plantar subluxation of the middle phalanx and some override. The subchondral cortex of the base middle phalanx appears intact, suggesting this does not represent PIP septic arthritis.  IMPRESSION: 1. Fracture subluxation of the PIP left second toe.   Electronically Signed   By: Arne Cleveland M.D.   On: 06/04/2013 14:17    Scheduled Meds: . allopurinol  100 mg Oral BID  . aspirin  325 mg Oral QHS  . carvedilol  12.5 mg Oral BID WC  . cholecalciferol  2,000 Units Oral Daily  . clindamycin (CLEOCIN) IV  600 mg Intravenous Q8H  . heparin  5,000 Units Subcutaneous 3 times per day  . hydrochlorothiazide  12.5 mg Oral Daily  . insulin aspart  0-9 Units Subcutaneous TID WC  . insulin aspart  4 Units Subcutaneous TID WC  . insulin glargine  38 Units Subcutaneous q morning - 10a  . irbesartan  300 mg Oral Daily  . omega-3 acid ethyl esters  1 g Oral BID  . rosuvastatin  20 mg Oral q1800   Continuous Infusions: . sodium chloride 20 mL/hr at 06/04/13 1752    Principal Problem:   Cellulitis Active Problems:   DM   HYPERLIPIDEMIA   HYPERTENSION   CAD   CKD (chronic kidney disease) stage 3, GFR 30-59 ml/min   Leukocytosis   Cellulitis and abscess of toe of left foot    Time spent: 85 minutes    Fae Blossom M.D. [facility] Pager [phone]. If 7PM-7AM, please contact night-coverage at [email or web address], password [facility] 06/06/2013, 12:39 PM  LOS: 2 days

## 2013-06-06 NOTE — Telephone Encounter (Signed)
Called hospital operator, spoke with patient herself, per Dr.moody no need to transport patient here and   Will hold off Radiation treatments for now,  will follow patient progress and will keep in touch next few days, patient thanked this RN for the call will try and call her in 2-3 days  11:18 AM

## 2013-06-06 NOTE — Progress Notes (Signed)
Inpatient Diabetes Program Recommendations  AACE/ADA: New Consensus Statement on Inpatient Glycemic Control (2013)  Target Ranges:  Prepandial:   less than 140 mg/dL      Peak postprandial:   less than 180 mg/dL (1-2 hours)      Critically ill patients:  140 - 180 mg/dL   Reason for Visit: Spoke to patient regarding elevated A1C=10.5%.    She states that she has been under a lot of stress with the death of her sister, mother and brother in the past year.  She is also undergoing treatment for breast cancer.  She admits that she rarely checks her CBG's and is frustrated that her CBG's are high.  She states that she has wanted to see an endocrinologist but has not gotten around to it.  Her goal is to get an appointment.  Gave her information regarding several endocrinology practices in town.  She seems motivated to improve her CBG's and seems to understand the reasons for good glycemic control.  She has been to the classes for diabetes on 2 separate occasions.   Meal coverage insulin added today.  Will follow.  Adah Perl, RN, BC-ADM Inpatient Diabetes Coordinator Pager [phone]

## 2013-06-07 ENCOUNTER — Ambulatory Visit: Payer: Medicare Other

## 2013-06-07 ENCOUNTER — Telehealth: Payer: Self-pay | Admitting: *Deleted

## 2013-06-07 ENCOUNTER — Encounter (HOSPITAL_COMMUNITY)
Admission: EM | Disposition: A | Discharge status: Home / Self Care | Payer: Self-pay | Source: Home / Self Care | Attending: Internal Medicine

## 2013-06-07 LAB — BASIC METABOLIC PANEL
BUN: 56 mg/dL — ABNORMAL HIGH (ref 6–23)
CALCIUM: 9.3 mg/dL (ref 8.4–10.5)
CHLORIDE: 100 meq/L (ref 96–112)
CO2: 23 meq/L (ref 19–32)
CREATININE: 1.86 mg/dL — AB (ref 0.50–1.10)
GFR calc Af Amer: 31 mL/min — ABNORMAL LOW (ref >90)
GFR calc non Af Amer: 27 mL/min — ABNORMAL LOW (ref >90)
GLUCOSE: 118 mg/dL — AB (ref 70–99)
Potassium: 4.3 mEq/L (ref 3.7–5.3)
Sodium: 139 mEq/L (ref 137–147)

## 2013-06-07 LAB — CBC
HEMATOCRIT: 34.5 % — AB (ref 36.0–46.0)
Hemoglobin: 11.4 g/dL — ABNORMAL LOW (ref 12.0–15.0)
MCH: 28.9 pg (ref 26.0–34.0)
MCHC: 33 g/dL (ref 30.0–36.0)
MCV: 87.6 fL (ref 78.0–100.0)
PLATELETS: 231 10*3/uL (ref 150–400)
RBC: 3.94 MIL/uL (ref 3.87–5.11)
RDW: 14 % (ref 11.5–15.5)
WBC: 8.4 10*3/uL (ref 4.0–10.5)

## 2013-06-07 LAB — URIC ACID: Uric Acid, Serum: 6.6 mg/dL (ref 2.4–7.0)

## 2013-06-07 LAB — GLUCOSE, CAPILLARY
GLUCOSE-CAPILLARY: 203 mg/dL — AB (ref 70–99)
Glucose-Capillary: 126 mg/dL — ABNORMAL HIGH (ref 70–99)
Glucose-Capillary: 228 mg/dL — ABNORMAL HIGH (ref 70–99)
Glucose-Capillary: 144 mg/dL — ABNORMAL HIGH (ref 70–99)

## 2013-06-07 SURGERY — AMPUTATION DIGIT
Anesthesia: General | Laterality: Left

## 2013-06-07 MED ORDER — MUPIROCIN CALCIUM 2 % EX CREA
TOPICAL_CREAM | Freq: Two times a day (BID) | CUTANEOUS | Status: DC
  Administered 2013-06-07: 1 via TOPICAL
  Administered 2013-06-07 – 2013-06-08 (×2): via TOPICAL
  Filled 2013-06-07: qty 15

## 2013-06-07 MED ORDER — HYDROCHLOROTHIAZIDE 12.5 MG PO CAPS
12.5000 mg | ORAL_CAPSULE | Freq: Once | ORAL | Status: AC
  Filled 2013-06-07: qty 1

## 2013-06-07 MED ORDER — HYDROCHLOROTHIAZIDE 25 MG PO TABS
25.0000 mg | ORAL_TABLET | Freq: Every day | ORAL | Status: DC
  Administered 2013-06-08: 25 mg via ORAL
  Filled 2013-06-07: qty 1

## 2013-06-07 NOTE — Progress Notes (Signed)
Inpatient Diabetes Program Recommendations  AACE/ADA: New Consensus Statement on Inpatient Glycemic Control (2013)  Target Ranges:  Prepandial:   less than 140 mg/dL      Peak postprandial:   less than 180 mg/dL (1-2 hours)      Critically ill patients:  140 - 180 mg/dL   Reason for Visit: Results for TIELA, FLUM (MRN [id number]) as of 06/07/2013 10:54  Ref. Range 06/07/2013 06:34  Glucose-Capillary Latest Range: 70-99 mg/dL 126 (H)   CBG's improved this AM.  Note that patient needs improved glycemic control.  She is interested in endocrinology consult after discharge.  May consider Novolog meal coverage at discharge to help control postprandial CBG's?    Thanks, Adah Perl, RN, BC-ADM Inpatient Diabetes Coordinator Pager [phone]

## 2013-06-07 NOTE — Progress Notes (Signed)
[facility] PROGRESS NOTE  [name] BMW:[id number] DOB: 11/13/1945 DOA: 06/04/2013 PCP: Alesia Richards, MD  Assessment/Plan: #1. Cellulitis/?osteomyelitis 2nd left toe ESR elevated. Patient has been seen by orthopedics who feels patient likely has osteomyelitis. Wound cultures have been obtained and  Pending, prelim with few staph species. Continue IV clindamycin. Orthopedics recommending trying to salvage toe, with antibiotic treatment and outpatient f/u in 1 week. Per orthopedics.  #2 diabetes mellitus Hemoglobin A1c 10.5.CBGs every from 141- 294. Continue Lantus 38 units daily. Continue meal coverage NovoLog 4 units 3 times daily with meals. Continue sliding scale insulin.  #3  Hypertension Continue Avapro,Coreg.Increase HCTZ to 70m daily.  #4 coronary artery disease status post CABG Stable. Continue Avapro. Increase HCTZ to 262mdaily. Continue Coreg aspirin.  #5 chronic kidney disease stage III Stable.  #6 hyperlipidemia Continue Lovaza, crestor.  #7 history of breast cancer status post lumpectomy Patient currently receiving radiation therapy. I have informed radiation oncology of patient's admission.   #8 history of gout Stable. Continue allopurinol.  #9 prophylaxis Heparin for DVT prophylaxis.  Code Status: Full Family Communication: Updated patient. Disposition Plan: Home when medically stable tomorrow.   Consultants:  Orthopedics:06/05/2013  Procedures:  X-ray left second toe 06/04/2013  Antibiotics:  IV clindamycin 06/04/2013  HPI/Subjective: Patient with no complaints.  Objective: Filed Vitals:   06/07/13 0502  BP: 131/61  Pulse: 79  Temp: 97.9 F (36.6 C)  Resp: 16    Intake/Output Summary (Last 24 hours) at 06/07/13 1316 Last data filed at 06/07/13 0800  Gross per 24 hour  Intake    820 ml  Output      0 ml  Net    820 ml   Filed Weights   06/06/13 0500 06/06/13 2143 06/07/13 0500  Weight: 82.056 kg (180 lb 14.4  oz) 84.142 kg (185 lb 8 oz) 85.186 kg (187 lb 12.8 oz)    Exam:   General:  NAD  Cardiovascular: RRR  Respiratory: CTAB  Abdomen: SOFT, NONTENDER, NONDISTENDED, POSITIVE BOWEL SOUNDS.  Musculoskeletal:mild edema dorsum of the left foot with mild erythema. Left second toe with decreased a draining ulcer on the medial side which is less erythematous and swollen.  Data Reviewed: Basic Metabolic Panel:  Recent Labs Lab 06/04/13 1230 06/04/13 1855 06/05/13 0527 06/06/13 0725 06/07/13 0345  NA 138  --  133* 138 139  K 4.5  --  4.3 4.6 4.3  CL 98  --  93* 100 100  CO2 24  --  '22 26 23  ' GLUCOSE 289*  --  493* 144* 118*  BUN 45*  --  39* 49* 56*  CREATININE 1.57* 1.43* 1.45* 1.65* 1.86*  CALCIUM 10.0  --  8.7 10.2 9.3  MG  --  2.1  --   --   --    Liver Function Tests: No results found for this basename: AST, ALT, ALKPHOS, BILITOT, PROT, ALBUMIN,  in the last 168 hours No results found for this basename: LIPASE, AMYLASE,  in the last 168 hours No results found for this basename: AMMONIA,  in the last 168 hours CBC:  Recent Labs Lab 06/04/13 1230 06/04/13 1855 06/05/13 0527 06/06/13 0725 06/07/13 0345  WBC 11.6* 9.7 8.9 7.0 8.4  NEUTROABS 8.4*  --   --   --   --   HGB 13.0 12.4 11.6* 12.7 11.4*  HCT 39.6 36.4 35.3* 38.8 34.5*  MCV 88.0 87.5 88.7 88.4 87.6  PLT 258 249 231 244 231   Cardiac Enzymes: No  results found for this basename: CKTOTAL, CKMB, CKMBINDEX, TROPONINI,  in the last 168 hours BNP (last 3 results) No results found for this basename: PROBNP,  in the last 8760 hours CBG:  Recent Labs Lab 06/06/13 1134 06/06/13 1639 06/06/13 2128 06/07/13 0634 06/07/13 1159  GLUCAP 206* 294* 129* 126* 228*    Recent Results (from the past 240 hour(s))  WOUND CULTURE     Status: None   Collection Time    06/05/13 10:48 AM      Result Value Ref Range Status   Specimen Description WOUND LEFT TOE   Final   Special Requests SECOND TOE   Final   Gram Stain      Final   Value: RARE WBC PRESENT, PREDOMINANTLY MONONUCLEAR     RARE SQUAMOUS EPITHELIAL CELLS PRESENT     RARE GRAM POSITIVE COCCI IN PAIRS     Performed at [facility]   Culture     Final   Value: FEW STAPHYLOCOCCUS SPECIES     Performed at [facility]   Report Status PENDING   Incomplete     Studies: No results found.  Scheduled Meds: . allopurinol  100 mg Oral BID  . aspirin  325 mg Oral QHS  . carvedilol  12.5 mg Oral BID WC  . cholecalciferol  2,000 Units Oral Daily  . clindamycin (CLEOCIN) IV  600 mg Intravenous Q8H  . docusate sodium  100 mg Oral BID  . heparin  5,000 Units Subcutaneous 3 times per day  . hydrochlorothiazide  12.5 mg Oral Daily  . insulin aspart  0-9 Units Subcutaneous TID WC  . insulin aspart  4 Units Subcutaneous TID WC  . insulin glargine  38 Units Subcutaneous q morning - 10a  . irbesartan  300 mg Oral Daily  . mupirocin cream   Topical BID  . omega-3 acid ethyl esters  1 g Oral BID  . rosuvastatin  20 mg Oral q1800   Continuous Infusions: . sodium chloride 100 mL/hr (06/07/13 0737)    Principal Problem:   Cellulitis Active Problems:   DM   HYPERLIPIDEMIA   HYPERTENSION   CAD   CKD (chronic kidney disease) stage 3, GFR 30-59 ml/min   Leukocytosis   Cellulitis and abscess of toe of left foot    Time spent: 26 minutes    [name] M.D. [facility] Pager [phone]. If 7PM-7AM, please contact night-coverage at [email or web address], password [facility] 06/07/2013, 1:16 PM  LOS: 3 days

## 2013-06-07 NOTE — Telephone Encounter (Signed)
error 

## 2013-06-07 NOTE — Consult Note (Signed)
Reason for Consult: Ulceration left foot second toe Referring Physician: Dr. Clarisse Gouge [name] is an 68 y.o. female.  HPI: Patient is a 68 year old woman with diabetic insensate neuropathy hemoglobin A1c 10.5 who did extensive traveling recently for her brother's funeral and developed an ulceration to the left foot second toe. Patient has been 2 shoe market and does have extra depth shoes. Patient does have a history of gout.  Past Medical History  Diagnosis Date  . Hypertension   . Coronary artery disease     NSTEMI in 05/2009;  LHC with 2 v CAD - > CABG (LIMA-LAD, SVG-diagonal, SVG-OM1/OM 2).   . Hyperlipidemia   . Ischemic cardiomyopathy     Echo 07/2009: Mild LVH, EF 40-45%, inferior and posterior HK, grade 1 diastolic dysfunction, moderate MAC, mild to moderate MR, mild LAE  . PAD (peripheral artery disease)   . Family history of anesthesia complication     Mom has a hard time to wake up  . Neuropathy     Hx; of B/L feet  . Type II diabetes mellitus   . Foot ulcer     "I've had them on both feet" (08/27/2012)  . Osteomyelitis of foot   . Sinus headache     "occasionally" (08/27/2012)  . Arthritis     "maybe in my lower back" (08/27/2012)  . Gouty arthritis     "right index finger" (08/27/2012)  . CKD (chronic kidney disease), stage I   . 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 Allergy   . NSTEMI (non-ST elevated myocardial infarction) 05/23/2009    Archie Endo 05/23/2009 (08/27/2012)    Past Surgical History  Procedure Laterality Date  . Cataract extraction w/ intraocular lens  implant, bilateral  2000's  . Finger surgery Right     "index finger; turned out to be gout" (08/27/2012)  . Coronary artery bypass graft  2011    "CABG X4" (08/27/2012)  . Dental surgery  04/30/11    "1 implant" (08/27/2012)  . Amputation ray Right 08/27/2012    5th ray/notes 08/27/2012  . Cardiac catheterization  05/24/2009    Archie Endo 05/24/2009 (08/27/2012)  .  Amputation Right 08/27/2012    Procedure: AMPUTATION RAY;  Surgeon: Newt Minion, MD;  Location: [place];  Service: Orthopedics;  Laterality: Right;  Right Foot 5th Ray Amputation  . Eye surgery    . Colonoscopy w/ biopsies and polypectomy  2010  . Breast lumpectomy  04/20/2013    with biopsy      DR WAKEFIELD  . Breast lumpectomy with needle localization and axillary sentinel lymph node bx Left 04/20/2013    Procedure: LEFT BREAST WIRE GUIDED LUMPECTOMY AND AXILLARY SENTINEL LYMPH NODE BX;  Surgeon: Rolm Bookbinder, MD;  Location: [facility] OR;  Service: General;  Laterality: Left;    Family History  Problem Relation Age of Onset  . Kidney cancer Brother 24  . Hypertension Brother   . Breast cancer Sister 71    LCIS; BRCA negative  . Throat cancer Father 98    smoker  . Breast cancer Maternal Aunt     dx in her 17s  . Breast cancer Sister 52    Lobular breast cancer    Social History:  reports that she has never smoked. She has never used smokeless tobacco. She reports that she does not drink alcohol or use illicit drugs.  Allergies:  Allergies  Allergen Reactions  . Atenolol Other (See Comments)  Exacerbates gout  . Contrast Media [Iodinated Diagnostic Agents] Rash  . Latex Rash  . Omnipaque [Iohexol] Rash    Medications: I have reviewed the patient's current medications.  Results for orders placed during the hospital encounter of 06/04/13 (from the past 48 hour(s))  GLUCOSE, CAPILLARY     Status: Abnormal   Collection Time    06/05/13  6:40 AM      Result Value Ref Range   Glucose-Capillary 189 (*) 70 - 99 mg/dL  WOUND CULTURE     Status: None   Collection Time    06/05/13 10:48 AM      Result Value Ref Range   Specimen Description WOUND LEFT TOE     Special Requests SECOND TOE     Gram Stain       Value: RARE WBC PRESENT, PREDOMINANTLY MONONUCLEAR     RARE SQUAMOUS EPITHELIAL CELLS PRESENT     RARE GRAM POSITIVE COCCI IN PAIRS     Performed at [facility]    Culture       Value: Culture reincubated for better growth     Performed at [facility]   Report Status PENDING    GLUCOSE, CAPILLARY     Status: Abnormal   Collection Time    06/05/13 11:14 AM      Result Value Ref Range   Glucose-Capillary 239 (*) 70 - 99 mg/dL   Comment 1 Notify RN     Comment 2 Documented in Chart    GLUCOSE, CAPILLARY     Status: Abnormal   Collection Time    06/05/13  4:37 PM      Result Value Ref Range   Glucose-Capillary 229 (*) 70 - 99 mg/dL  GLUCOSE, CAPILLARY     Status: Abnormal   Collection Time    06/05/13  9:31 PM      Result Value Ref Range   Glucose-Capillary 235 (*) 70 - 99 mg/dL  GLUCOSE, CAPILLARY     Status: Abnormal   Collection Time    06/06/13  6:31 AM      Result Value Ref Range   Glucose-Capillary 141 (*) 70 - 99 mg/dL  BASIC METABOLIC PANEL     Status: Abnormal   Collection Time    06/06/13  7:25 AM      Result Value Ref Range   Sodium 138  137 - 147 mEq/L   Potassium 4.6  3.7 - 5.3 mEq/L   Chloride 100  96 - 112 mEq/L   CO2 26  19 - 32 mEq/L   Glucose, Bld 144 (*) 70 - 99 mg/dL   BUN 49 (*) 6 - 23 mg/dL   Creatinine, Ser 1.65 (*) 0.50 - 1.10 mg/dL   Calcium 10.2  8.4 - 10.5 mg/dL   GFR calc non Af Amer 31 (*) >90 mL/min   GFR calc Af Amer 36 (*) >90 mL/min   Comment: (NOTE)     The eGFR has been calculated using the CKD EPI equation.     This calculation has not been validated in all clinical situations.     eGFR's persistently <90 mL/min signify possible Chronic Kidney     Disease.  CBC     Status: None   Collection Time    06/06/13  7:25 AM      Result Value Ref Range   WBC 7.0  4.0 - 10.5 K/uL   RBC 4.39  3.87 - 5.11 MIL/uL   Hemoglobin 12.7  12.0 -  15.0 g/dL   HCT 38.8  36.0 - 46.0 %   MCV 88.4  78.0 - 100.0 fL   MCH 28.9  26.0 - 34.0 pg   MCHC 32.7  30.0 - 36.0 g/dL   RDW 14.1  11.5 - 15.5 %   Platelets 244  150 - 400 K/uL  GLUCOSE, CAPILLARY     Status: Abnormal   Collection Time    06/06/13  11:34 AM      Result Value Ref Range   Glucose-Capillary 206 (*) 70 - 99 mg/dL  GLUCOSE, CAPILLARY     Status: Abnormal   Collection Time    06/06/13  4:39 PM      Result Value Ref Range   Glucose-Capillary 294 (*) 70 - 99 mg/dL  GLUCOSE, CAPILLARY     Status: Abnormal   Collection Time    06/06/13  9:28 PM      Result Value Ref Range   Glucose-Capillary 129 (*) 70 - 99 mg/dL  BASIC METABOLIC PANEL     Status: Abnormal   Collection Time    06/07/13  3:45 AM      Result Value Ref Range   Sodium 139  137 - 147 mEq/L   Potassium 4.3  3.7 - 5.3 mEq/L   Chloride 100  96 - 112 mEq/L   CO2 23  19 - 32 mEq/L   Glucose, Bld 118 (*) 70 - 99 mg/dL   BUN 56 (*) 6 - 23 mg/dL   Creatinine, Ser 1.86 (*) 0.50 - 1.10 mg/dL   Calcium 9.3  8.4 - 10.5 mg/dL   GFR calc non Af Amer 27 (*) >90 mL/min   GFR calc Af Amer 31 (*) >90 mL/min   Comment: (NOTE)     The eGFR has been calculated using the CKD EPI equation.     This calculation has not been validated in all clinical situations.     eGFR's persistently <90 mL/min signify possible Chronic Kidney     Disease.  CBC     Status: Abnormal   Collection Time    06/07/13  3:45 AM      Result Value Ref Range   WBC 8.4  4.0 - 10.5 K/uL   RBC 3.94  3.87 - 5.11 MIL/uL   Hemoglobin 11.4 (*) 12.0 - 15.0 g/dL   HCT 34.5 (*) 36.0 - 46.0 %   MCV 87.6  78.0 - 100.0 fL   MCH 28.9  26.0 - 34.0 pg   MCHC 33.0  30.0 - 36.0 g/dL   RDW 14.0  11.5 - 15.5 %   Platelets 231  150 - 400 K/uL    No results found.  Review of Systems  All other systems reviewed and are negative.   Blood pressure 131/61, pulse 79, temperature 97.9 F (36.6 C), temperature source Oral, resp. rate 16, weight 85.186 kg (187 lb 12.8 oz), SpO2 100.00%. Physical Exam On examination patient's foot has palpable pulses. She has no cellulitis in the foot there is no drainage from the second toe. She has good granulation tissue on the second toe in the first web space. This does not probe  to bone. There is no sausage digit swelling. Review of the radiographs does show some destructive changes of the second toe this may be due to get out. Assessment/Plan: Assessment: Ulceration left foot second toe with diabetic insensate neuropathy and history of gout.  Plan: I will order a uric acid levels. Discussed with the patient and her elevated  hemoglobin A1c and the need for better glucose control. I think it is worth trying to salvage the second toe. Will start her on Bactroban dressing changes with continue IV antibiotics today and anticipate discharge tomorrow on doxycycline 100 mg by mouth twice a day. I will followup in the office in one week. Will give her a postoperative shoe. She will continue with Bactroban dressing changes and oral antibiotics at discharge. Patient may require adjustment of her allopurinol pending the results of her uric acid test.  DUDA,MARCUS V 06/07/2013, 6:16 AM

## 2013-06-07 NOTE — Discharge Instructions (Signed)
Bactroban ointment plus dry dressing to left foot second toe twice a day. Wear postoperative shoe daily. Minimize weightbearing.

## 2013-06-08 ENCOUNTER — Ambulatory Visit: Payer: Medicare Other

## 2013-06-08 LAB — CBC
HCT: 36.3 % (ref 36.0–46.0)
Hemoglobin: 12.1 g/dL (ref 12.0–15.0)
MCH: 29.3 pg (ref 26.0–34.0)
MCHC: 33.3 g/dL (ref 30.0–36.0)
MCV: 87.9 fL (ref 78.0–100.0)
Platelets: 243 10*3/uL (ref 150–400)
RBC: 4.13 MIL/uL (ref 3.87–5.11)
RDW: 14.1 % (ref 11.5–15.5)
WBC: 8.2 10*3/uL (ref 4.0–10.5)

## 2013-06-08 LAB — BASIC METABOLIC PANEL
BUN: 48 mg/dL — ABNORMAL HIGH (ref 6–23)
CO2: 21 mEq/L (ref 19–32)
CREATININE: 1.69 mg/dL — AB (ref 0.50–1.10)
Calcium: 9.1 mg/dL (ref 8.4–10.5)
Chloride: 102 mEq/L (ref 96–112)
GFR calc Af Amer: 35 mL/min — ABNORMAL LOW (ref >90)
GFR, EST NON AFRICAN AMERICAN: 30 mL/min — AB (ref >90)
Glucose, Bld: 105 mg/dL — ABNORMAL HIGH (ref 70–99)
Potassium: 4.2 mEq/L (ref 3.7–5.3)
Sodium: 139 mEq/L (ref 137–147)

## 2013-06-08 LAB — GLUCOSE, CAPILLARY
Glucose-Capillary: 117 mg/dL — ABNORMAL HIGH (ref 70–99)
Glucose-Capillary: 210 mg/dL — ABNORMAL HIGH (ref 70–99)
Glucose-Capillary: 195 mg/dL — ABNORMAL HIGH (ref 70–99)

## 2013-06-08 MED ORDER — DOXYCYCLINE HYCLATE 100 MG PO TABS: 100.0000 mg | ORAL_TABLET | Freq: Two times a day (BID) | ORAL | Status: DC

## 2013-06-08 MED ORDER — OXYCODONE HCL 5 MG PO TABS: 5.0000 mg | ORAL_TABLET | ORAL | Status: DC | PRN

## 2013-06-08 MED ORDER — MUPIROCIN CALCIUM 2 % EX CREA: TOPICAL_CREAM | Freq: Two times a day (BID) | CUTANEOUS | Status: DC

## 2013-06-08 NOTE — Discharge Summary (Signed)
Physician Discharge Summary  [name]:[id number] DOB: 02-14-1946 DOA: 06/04/2013  PCP: Alesia Richards, MD  Admit date: 06/04/2013 Discharge date: 06/08/2013  Time spent: >60mnutes .follow Recommendations for Outpatient Follow-up:  Follow-up Information   Follow up with DUDA,MARCUS V, MD In 1 week.   Specialty:  Orthopedic Surgery   Contact information:   [phone]      Follow up with MUnk PintoDAVID, MD. (in 1-2weeks, call for appt upon discharge)    Specialty:  Internal Medicine   Contact information:   [phone]       Discharge Diagnoses:  Principal Problem:   Cellulitis Active Problems:   DM   HYPERLIPIDEMIA   HYPERTENSION   CAD   CKD (chronic kidney disease) stage 3, GFR 30-59 ml/min   Leukocytosis   Cellulitis and abscess of toe of left foot   Discharge Condition: Improved/stable  Diet recommendation: Modified to carbohydrate  Filed Weights   06/06/13 2143 06/07/13 0500 06/08/13 0500  Weight: 84.142 kg (185 lb 8 oz) 85.186 kg (187 lb 12.8 oz) 83.462 kg (184 lb)    History of present illness:  [name] a 68y.o. female with pmh significant for HTN, HLD, DM, CKD (stage 3); left breast cancer (s/p lumpectomy and actively Receiving radiation), CAD (s/p CABG) and ischemic cardiomyopathy with EF 40-45%; came to ED secondary to 2 weeks of worsening swelling, pain and erythema on her left second toe. Patient reports chills and subjective fever (but difficult to tell since she experienced power outrage and expressed could just be the weather); denies CP, nausea, vomiting, diarrhea, melena, hematochezia; mechanical injuries to her foot or any other complaints. In ED was found to have cellulitis with ongoing open blisters and purulent/senguineous discharge from her toe, elevated WBC's and x-ray demonstrating subluxation fracture. [facility] called to  admit for further evaluation and treatment. Orthopedics was consulted and patient started on IV antibiotics.    Hospital Course:  #1. Cellulitis/?osteomyelitis 2nd left toe  ESR elevated. Patient has been seen by orthopedics who feels patient likely has osteomyelitis. Wound cultures have been obtained and Pending, prelim with few staph species. Continue IV clindamycin. Orthopedics recommending trying to salvage toe, with antibiotic treatment and outpatient f/u in 1 week. Per orthopedics.  -Patient clinically improving, she remains afebrile with no leukocytosis. -As ready mentioned Dr. DSharol Givenrecommends discharging on oral antibiotics>> doxycycline 100 mg twice a day>> I. have given her a prescription for 2 weeks and Dr. DSharol Givento further direct duration of antibiotics when she follows up with him in one week #2 diabetes mellitus  Hemoglobin A1c 10.5.CBGs every from 141- 294. Continue Lantus 38 units daily. She was covered with sliding scale insulin the hospital. She is to resume her metformin and follow up with her PCP for close monitoring of her blood sugars and adjustment of her meds as clinically appropriate for optimal blood glucose control.   #3 Hypertension  Continue Avapro,Coreg and HCTZ on discharge  #4 coronary artery disease status post CABG  Stable. Continue Avapro. Continue Coreg aspirin.  #5 chronic kidney disease stage III  Stable.  #6 hyperlipidemia  Continue Lovaza, crestor.  #7 history of breast cancer status post lumpectomy  Patient currently receiving radiation therapy. I have informed radiation oncology of patient's admission.  #8 history of gout  Stable. Continue allopurinol. Uric acid level was checked and came back within normal limits at 6.6. #9 prophylaxis  Heparin for DVT prophylaxis.   Procedures:  none  Consultations:   orthopedics-Dr. Sharol Given   Discharge Exam: Filed Vitals:   06/08/13 0640  BP: 159/70  Pulse: 78  Temp: 98.7 F (37.1 C)  Resp: 18    Exam:  General: NAD  Cardiovascular: RRR  Respiratory: CTAB  Abdomen: SOFT, NONTENDER, NONDISTENDED, POSITIVE BOWEL SOUNDS.  Musculoskeletal:mild edema dorsum of the left foot with mild erythema. Left second toe with small bloody discharge  On  the medial side with decreased erythema and swelling.    Discharge Instructions  Discharge Orders   Future Appointments Provider Department Dept Phone   06/10/2013 2:00 PM [facility] [phone]   06/13/2013 2:00 PM [facility] [phone]   06/14/2013 2:00 PM [facility] [phone]   06/15/2013 2:00 PM [facility] [phone]   06/16/2013 2:00 PM [facility] [phone]   06/17/2013 2:00 PM [facility] [phone]   06/20/2013 2:00 PM [facility] [phone]   06/21/2013 2:00 PM [facility] [phone]   06/22/2013 2:00 PM [facility] [phone]   06/23/2013 2:00 PM [facility] [phone]   06/24/2013 2:00 PM [facility] [phone]   06/27/2013 2:00 PM [facility] [phone]   06/28/2013 2:00 PM [facility] [phone]   06/29/2013 2:00 PM [facility] [phone]   06/30/2013 2:00 PM [facility] [phone]   07/04/2013 2:00 PM [facility] [phone]   07/05/2013 11:50 AM [facility] [phone]   07/05/2013 2:00 PM [facility] [phone]   07/06/2013 2:00 PM [facility] [phone]   07/27/2013 9:30 AM Unk Pinto, MD [facility] [phone]   08/18/2013 11:30 AM [facility] [phone]   08/18/2013 12:00 PM Chauncey Cruel, [facility] [phone]   10/27/2013 8:45 AM Ardis Hughs, PA-C [facility] [phone]   01/30/2014 9:00 AM Unk Pinto, MD [facility] [phone]   Future Orders Complete By Expires   Diet Carb Modified  As directed    Increase activity slowly  As directed        Medication List         allopurinol 100 MG tablet  Commonly known as:  ZYLOPRIM  Take 100 mg by mouth 2 (two) times daily.     Alpha-Lipoic Acid 200 MG Caps  Take 200 mg by mouth 2 (two) times daily.     anastrozole 1 MG tablet  Commonly known as:  ARIMIDEX  Take 1 tablet (1 mg total) by mouth daily.     aspirin 325 MG tablet  Take 325 mg by mouth at bedtime.     aspirin-acetaminophen-caffeine 372-902-11 MG per tablet  Commonly known as:  EXCEDRIN MIGRAINE  Take 1-2 tablets by mouth every 6 (six) hours as needed (sinus headache).     carvedilol 12.5 MG tablet  Commonly known as:  COREG  Take 1 tablet (12.5 mg total) by mouth 2 (two) times daily with a meal.     cholecalciferol 1000 UNITS tablet  Commonly known as:  VITAMIN D  Take 2,000 Units by mouth daily.     Co Q-10 100 MG Caps  Take 100 mg by mouth 2 (two) times daily.     COLCRYS 0.6 MG tablet  Generic drug:  colchicine  Take 0.6 mg by mouth daily as needed (for gout  flareups).     doxycycline 100 MG tablet  Commonly known as:  VIBRA-TABS  Take 1 tablet (100 mg total) by mouth 2 (two) times daily.     fish oil-omega-3 fatty acids 1000 MG capsule  Take 1 g by mouth 2 (two) times daily.     glimepiride 4 MG tablet  Commonly known as:  AMARYL  Take 4 mg by mouth 2 (two) times daily.     hydrochlorothiazide 12.5 MG tablet  Commonly known as:  HYDRODIURIL  Take 12.5 mg by mouth daily.     insulin glargine 100 UNIT/ML injection  Commonly known as:  LANTUS  Inject 35 Units into the skin every morning.     MELATONIN PO  Take 1 tablet by mouth at bedtime.     metformin 1000 MG (OSM) 24 hr tablet  Commonly known as:  FORTAMET  Take 1,000 mg by mouth 2 (two) times daily with a meal.     mupirocin cream 2 %  Commonly known as:  BACTROBAN  Apply topically 2 (two) times daily.     oxyCODONE 5 MG immediate release tablet  Commonly known as:  Oxy IR/ROXICODONE  Take 1 tablet (5 mg total) by mouth every 4 (four) hours as needed for moderate pain.     REFRESH OP  Place 1 drop into both eyes daily as needed (dry eyes).     rosuvastatin 20 MG tablet  Commonly known as:  CRESTOR  Take 20 mg by mouth daily.     valsartan 320 MG tablet  Commonly known as:  DIOVAN  Take 320 mg by mouth daily.       Allergies  Allergen Reactions  . Atenolol Other (See Comments)    Exacerbates gout  . Contrast Media [Iodinated Diagnostic Agents] Rash  . Latex Rash  . Omnipaque [Iohexol] Rash       Follow-up Information   Follow up with DUDA,MARCUS V, MD In 1 week.   Specialty:  Orthopedic Surgery   Contact information:   [place] [address] [phone]        The results of significant diagnostics from this hospitalization (including imaging, microbiology, ancillary and laboratory) are listed below for reference.    Significant Diagnostic Studies: Dg Toe 2nd Left  06/04/2013   CLINICAL DATA:  Blister.  Diabetes.  EXAM: LEFT  SECOND TOE  COMPARISON:  11/20/2012  FINDINGS: The phalanges of the left second toe are not well profiled. However, it looks like there has been fracture and/or the mineralization of the head of the proximal phalanx, with resultant plantar subluxation of the middle phalanx and some override. The subchondral cortex of the base middle phalanx appears intact, suggesting this does not represent PIP septic arthritis.  IMPRESSION: 1. Fracture subluxation of the PIP left second toe.   Electronically Signed  By: Arne Cleveland M.D.   On: 06/04/2013 14:17    Microbiology: Recent Results (from the past 240 hour(s))  WOUND CULTURE     Status: None   Collection Time    06/05/13 10:48 AM      Result Value Ref Range Status   Specimen Description WOUND LEFT TOE   Final   Special Requests SECOND TOE   Final   Gram Stain     Final   Value: RARE WBC PRESENT, PREDOMINANTLY MONONUCLEAR     RARE SQUAMOUS EPITHELIAL CELLS PRESENT     RARE GRAM POSITIVE COCCI IN PAIRS     Performed at [facility]   Culture     Final   Value: FEW STAPHYLOCOCCUS SPECIES     Performed at [facility]   Report Status PENDING   Incomplete     Labs: Basic Metabolic Panel:  Recent Labs Lab 06/04/13 1230 06/04/13 1855 06/05/13 0527 06/06/13 0725 06/07/13 0345 06/08/13 0450  NA 138  --  133* 138 139 139  K 4.5  --  4.3 4.6 4.3 4.2  CL 98  --  93* 100 100 102  CO2 24  --  _0 GLUCOSE 289*  --  493* 144* 118* 105*  BUN 45*  --  39* 49* 56* 48*  CREATININE 1.57* 1.43* 1.45* 1.65* 1.86* 1.69*  CALCIUM 10.0  --  8.7 10.2 9.3 9.1  MG  --  2.1  --   --   --   --    Liver Function Tests: No results found for this basename: AST, ALT, ALKPHOS, BILITOT, PROT, ALBUMIN,  in the last 168 hours No results found for this basename: LIPASE, AMYLASE,  in the last 168 hours No results found for this basename: AMMONIA,  in the last 168 hours CBC:  Recent Labs Lab 06/04/13 1230 06/04/13 1855 06/05/13 0527  06/06/13 0725 06/07/13 0345 06/08/13 0450  WBC 11.6* 9.7 8.9 7.0 8.4 8.2  NEUTROABS 8.4*  --   --   --   --   --   HGB 13.0 12.4 11.6* 12.7 11.4* 12.1  HCT 39.6 36.4 35.3* 38.8 34.5* 36.3  MCV 88.0 87.5 88.7 88.4 87.6 87.9  PLT 258 249 231 244 231 243   Cardiac Enzymes: No results found for this basename: CKTOTAL, CKMB, CKMBINDEX, TROPONINI,  in the last 168 hours BNP: BNP (last 3 results) No results found for this basename: PROBNP,  in the last 8760 hours CBG:  Recent Labs Lab 06/07/13 1159 06/07/13 1645 06/07/13 2227 06/08/13 0646 06/08/13 1109  GLUCAP 228* 203* 144* 117* 210*       Signed:  Mollyann Halbert C  [facility] 06/08/2013, 2:08 PM

## 2013-06-09 ENCOUNTER — Telehealth: Payer: Self-pay | Admitting: *Deleted

## 2013-06-09 ENCOUNTER — Ambulatory Visit: Payer: Medicare Other

## 2013-06-09 ENCOUNTER — Ambulatory Visit
Admission: RE | Admit: 2013-06-09 | Discharge: 2013-06-09 | Disposition: A | Discharge status: Home / Self Care | Payer: Medicare Other | Source: Ambulatory Visit | Attending: Radiation Oncology | Admitting: Radiation Oncology

## 2013-06-09 DIAGNOSIS — Z79899 Other long term (current) drug therapy: Secondary | ICD-10-CM | POA: Diagnosis not present

## 2013-06-09 DIAGNOSIS — C50219 Malignant neoplasm of upper-inner quadrant of unspecified female breast: Secondary | ICD-10-CM | POA: Diagnosis not present

## 2013-06-09 DIAGNOSIS — C50212 Malignant neoplasm of upper-inner quadrant of left female breast: Secondary | ICD-10-CM

## 2013-06-09 DIAGNOSIS — Z510 Encounter for antineoplastic radiation therapy: Secondary | ICD-10-CM | POA: Diagnosis not present

## 2013-06-09 DIAGNOSIS — L589 Radiodermatitis, unspecified: Secondary | ICD-10-CM | POA: Diagnosis not present

## 2013-06-09 DIAGNOSIS — R5381 Other malaise: Secondary | ICD-10-CM | POA: Diagnosis not present

## 2013-06-09 LAB — WOUND CULTURE

## 2013-06-09 MED ORDER — RADIAPLEXRX EX GEL
Freq: Once | CUTANEOUS | Status: AC
  Administered 2013-06-09: 11:00:00 via TOPICAL

## 2013-06-09 MED ORDER — ALRA NON-METALLIC DEODORANT (RAD-ONC)
1.0000 "application " | Freq: Once | TOPICAL | Status: AC
  Administered 2013-06-09: 1 via TOPICAL

## 2013-06-09 NOTE — Telephone Encounter (Signed)
Called                         patient will be here today, at 2pm, asked that someone meet her at elevator 1st time, doing better form d/c hospital yesterday, will do education tomorrow will give products and book today to review 9:05 AM

## 2013-06-10 ENCOUNTER — Encounter: Payer: Self-pay | Admitting: Radiation Oncology

## 2013-06-10 ENCOUNTER — Ambulatory Visit
Admission: RE | Admit: 2013-06-10 | Discharge: 2013-06-10 | Disposition: A | Discharge status: Home / Self Care | Payer: Medicare Other | Source: Ambulatory Visit | Attending: Radiation Oncology | Admitting: Radiation Oncology

## 2013-06-10 VITALS — BP 167/83 | HR 81 | Temp 97.7°F | Resp 20 | Wt 178.7 lb

## 2013-06-10 DIAGNOSIS — C50219 Malignant neoplasm of upper-inner quadrant of unspecified female breast: Secondary | ICD-10-CM | POA: Diagnosis not present

## 2013-06-10 DIAGNOSIS — C50212 Malignant neoplasm of upper-inner quadrant of left female breast: Secondary | ICD-10-CM

## 2013-06-10 DIAGNOSIS — R5381 Other malaise: Secondary | ICD-10-CM | POA: Diagnosis not present

## 2013-06-10 DIAGNOSIS — L589 Radiodermatitis, unspecified: Secondary | ICD-10-CM | POA: Diagnosis not present

## 2013-06-10 DIAGNOSIS — Z510 Encounter for antineoplastic radiation therapy: Secondary | ICD-10-CM | POA: Diagnosis not present

## 2013-06-10 DIAGNOSIS — Z79899 Other long term (current) drug therapy: Secondary | ICD-10-CM | POA: Diagnosis not present

## 2013-06-10 NOTE — Progress Notes (Signed)
Pt denies pain, fatigue, loss of appetite. Post sim ed completed w/pt. Reviewed "Radiation and You" booklet, re: fatigue, skin irritation/care. Reviewed proper use of Radiaplex , Alra. All questions answered. Pt verbalized understanding.

## 2013-06-10 NOTE — Progress Notes (Signed)
Department of Radiation Oncology  Phone:  [phone] Fax:        [phone]  Weekly Treatment Note    Name: [name] Date: 06/10/2013 MRN: [id number] DOB: 12-Feb-1946   Current dose: 5 Gy  Current fraction: 2   MEDICATIONS: Current Outpatient Prescriptions  Medication Sig Dispense Refill  . allopurinol (ZYLOPRIM) 100 MG tablet Take 100 mg by mouth 2 (two) times daily.      . Alpha-Lipoic Acid 200 MG CAPS Take 200 mg by mouth 2 (two) times daily.      Marland Kitchen anastrozole (ARIMIDEX) 1 MG tablet Take 1 tablet (1 mg total) by mouth daily.  90 tablet  4  . aspirin 325 MG tablet Take 325 mg by mouth at bedtime.       Marland Kitchen aspirin-acetaminophen-caffeine (EXCEDRIN MIGRAINE) 250-250-65 MG per tablet Take 1-2 tablets by mouth every 6 (six) hours as needed (sinus headache).      . carvedilol (COREG) 12.5 MG tablet Take 1 tablet (12.5 mg total) by mouth 2 (two) times daily with a meal.  180 tablet  0  . cholecalciferol (VITAMIN D) 1000 UNITS tablet Take 2,000 Units by mouth daily.      . Coenzyme Q10 (CO Q-10) 100 MG CAPS Take 100 mg by mouth 2 (two) times daily.      Marland Kitchen COLCRYS 0.6 MG tablet Take 0.6 mg by mouth daily as needed (for gout flareups).       . doxycycline (VIBRA-TABS) 100 MG tablet Take 1 tablet (100 mg total) by mouth 2 (two) times daily.  28 tablet  0  . fish oil-omega-3 fatty acids 1000 MG capsule Take 1 g by mouth 2 (two) times daily.       Marland Kitchen glimepiride (AMARYL) 4 MG tablet Take 4 mg by mouth 2 (two) times daily.       . hyaluronate sodium (RADIAPLEXRX) GEL Apply 1 application topically 2 (two) times daily. Apply after rad tx  And bedtime daily,not 4 hours prior to rad tx      . hydrochlorothiazide (HYDRODIURIL) 12.5 MG tablet Take 12.5 mg by mouth daily.      . insulin glargine (LANTUS) 100 UNIT/ML injection Inject 35 Units into the skin every morning.       Marland Kitchen MELATONIN PO Take 1 tablet by mouth at bedtime.      . metformin (FORTAMET) 1000 MG (OSM) 24 hr tablet Take 1,000  mg by mouth 2 (two) times daily with a meal.      . mupirocin cream (BACTROBAN) 2 % Apply topically 2 (two) times daily.  15 g  0  . non-metallic deodorant (ALRA) MISC Apply 1 application topically daily as needed.      Marland Kitchen oxyCODONE (OXY IR/ROXICODONE) 5 MG immediate release tablet Take 1 tablet (5 mg total) by mouth every 4 (four) hours as needed for moderate pain.  30 tablet  0  . Polyvinyl Alcohol-Povidone (REFRESH OP) Place 1 drop into both eyes daily as needed (dry eyes).      . rosuvastatin (CRESTOR) 20 MG tablet Take 20 mg by mouth daily.      . valsartan (DIOVAN) 320 MG tablet Take 320 mg by mouth daily.       No current facility-administered medications for this encounter.     ALLERGIES: Atenolol; Contrast media; Latex; and Omnipaque   LABORATORY DATA:  Lab Results  Component Value Date   WBC 8.2 06/08/2013   HGB 12.1 06/08/2013   HCT 36.3 06/08/2013   MCV  87.9 06/08/2013   PLT 243 06/08/2013   Lab Results  Component Value Date   NA 139 06/08/2013   K 4.2 06/08/2013   CL 102 06/08/2013   CO2 21 06/08/2013   Lab Results  Component Value Date   ALT 23 04/28/2013   AST 21 04/28/2013   ALKPHOS 78 04/28/2013   BILITOT 0.37 04/28/2013     NARRATIVE: [name] was seen today for weekly treatment management. The chart was checked and the patient's films were reviewed. The patient is doing very well with the beginning of her treatment. No complaints of far.  PHYSICAL EXAMINATION: weight is 178 lb 11.2 oz (81.058 kg). Her oral temperature is 97.7 F (36.5 C). Her blood pressure is 167/83 and her pulse is 81. Her respiration is 20.        ASSESSMENT: The patient is doing satisfactorily with treatment.  PLAN: We will continue with the patient's radiation treatment as planned.

## 2013-06-13 ENCOUNTER — Ambulatory Visit
Admission: RE | Admit: 2013-06-13 | Discharge: 2013-06-13 | Disposition: A | Discharge status: Home / Self Care | Payer: Medicare Other | Source: Ambulatory Visit | Attending: Radiation Oncology | Admitting: Radiation Oncology

## 2013-06-13 DIAGNOSIS — L97509 Non-pressure chronic ulcer of other part of unspecified foot with unspecified severity: Secondary | ICD-10-CM | POA: Diagnosis not present

## 2013-06-13 DIAGNOSIS — Z510 Encounter for antineoplastic radiation therapy: Secondary | ICD-10-CM | POA: Diagnosis not present

## 2013-06-13 DIAGNOSIS — E1149 Type 2 diabetes mellitus with other diabetic neurological complication: Secondary | ICD-10-CM | POA: Diagnosis not present

## 2013-06-13 DIAGNOSIS — L589 Radiodermatitis, unspecified: Secondary | ICD-10-CM | POA: Diagnosis not present

## 2013-06-13 DIAGNOSIS — C50219 Malignant neoplasm of upper-inner quadrant of unspecified female breast: Secondary | ICD-10-CM | POA: Diagnosis not present

## 2013-06-13 DIAGNOSIS — R5381 Other malaise: Secondary | ICD-10-CM | POA: Diagnosis not present

## 2013-06-13 DIAGNOSIS — Z79899 Other long term (current) drug therapy: Secondary | ICD-10-CM | POA: Diagnosis not present

## 2013-06-14 ENCOUNTER — Ambulatory Visit: Payer: Medicare Other

## 2013-06-14 ENCOUNTER — Ambulatory Visit
Admission: RE | Admit: 2013-06-14 | Discharge: 2013-06-14 | Disposition: A | Discharge status: Home / Self Care | Payer: Medicare Other | Source: Ambulatory Visit | Attending: Radiation Oncology | Admitting: Radiation Oncology

## 2013-06-14 DIAGNOSIS — C50219 Malignant neoplasm of upper-inner quadrant of unspecified female breast: Secondary | ICD-10-CM | POA: Diagnosis not present

## 2013-06-14 DIAGNOSIS — Z510 Encounter for antineoplastic radiation therapy: Secondary | ICD-10-CM | POA: Diagnosis not present

## 2013-06-14 DIAGNOSIS — R5381 Other malaise: Secondary | ICD-10-CM | POA: Diagnosis not present

## 2013-06-14 DIAGNOSIS — Z79899 Other long term (current) drug therapy: Secondary | ICD-10-CM | POA: Diagnosis not present

## 2013-06-14 DIAGNOSIS — R5383 Other fatigue: Secondary | ICD-10-CM | POA: Diagnosis not present

## 2013-06-14 DIAGNOSIS — L589 Radiodermatitis, unspecified: Secondary | ICD-10-CM | POA: Diagnosis not present

## 2013-06-15 ENCOUNTER — Telehealth: Payer: Self-pay | Admitting: *Deleted

## 2013-06-15 ENCOUNTER — Ambulatory Visit
Admission: RE | Admit: 2013-06-15 | Discharge: 2013-06-15 | Disposition: A | Discharge status: Home / Self Care | Payer: Medicare Other | Source: Ambulatory Visit | Attending: Radiation Oncology | Admitting: Radiation Oncology

## 2013-06-15 ENCOUNTER — Other Ambulatory Visit: Payer: Self-pay | Admitting: *Deleted

## 2013-06-15 DIAGNOSIS — L589 Radiodermatitis, unspecified: Secondary | ICD-10-CM | POA: Diagnosis not present

## 2013-06-15 DIAGNOSIS — Z79899 Other long term (current) drug therapy: Secondary | ICD-10-CM | POA: Diagnosis not present

## 2013-06-15 DIAGNOSIS — C50219 Malignant neoplasm of upper-inner quadrant of unspecified female breast: Secondary | ICD-10-CM | POA: Diagnosis not present

## 2013-06-15 DIAGNOSIS — R5381 Other malaise: Secondary | ICD-10-CM | POA: Diagnosis not present

## 2013-06-15 DIAGNOSIS — R5383 Other fatigue: Secondary | ICD-10-CM | POA: Diagnosis not present

## 2013-06-15 DIAGNOSIS — Z510 Encounter for antineoplastic radiation therapy: Secondary | ICD-10-CM | POA: Diagnosis not present

## 2013-06-15 MED ORDER — HYDROCHLOROTHIAZIDE 12.5 MG PO TABS: 12.5000 mg | ORAL_TABLET | Freq: Every day | ORAL | Status: DC

## 2013-06-15 MED ORDER — GLUCOSE BLOOD VI STRP: ORAL_STRIP | Status: DC

## 2013-06-15 NOTE — Telephone Encounter (Signed)
Paitent called and states glucose levels under 100 and she feels weak.  Per Dr Melford Aase, reduce Glimeperide 4 mg BID to 1/2 tab BID, monitor glucose and can reduce to 1/2 QD or possibly stop med. Continue all other meds the same.

## 2013-06-16 ENCOUNTER — Ambulatory Visit
Admission: RE | Admit: 2013-06-16 | Discharge: 2013-06-16 | Disposition: A | Discharge status: Home / Self Care | Payer: Medicare Other | Source: Ambulatory Visit | Attending: Radiation Oncology | Admitting: Radiation Oncology

## 2013-06-16 DIAGNOSIS — L589 Radiodermatitis, unspecified: Secondary | ICD-10-CM | POA: Diagnosis not present

## 2013-06-16 DIAGNOSIS — C50219 Malignant neoplasm of upper-inner quadrant of unspecified female breast: Secondary | ICD-10-CM | POA: Diagnosis not present

## 2013-06-16 DIAGNOSIS — R5381 Other malaise: Secondary | ICD-10-CM | POA: Diagnosis not present

## 2013-06-16 DIAGNOSIS — Z79899 Other long term (current) drug therapy: Secondary | ICD-10-CM | POA: Diagnosis not present

## 2013-06-16 DIAGNOSIS — Z510 Encounter for antineoplastic radiation therapy: Secondary | ICD-10-CM | POA: Diagnosis not present

## 2013-06-16 DIAGNOSIS — R5383 Other fatigue: Secondary | ICD-10-CM | POA: Diagnosis not present

## 2013-06-17 ENCOUNTER — Ambulatory Visit
Admission: RE | Admit: 2013-06-17 | Discharge: 2013-06-17 | Disposition: A | Discharge status: Home / Self Care | Payer: Medicare Other | Source: Ambulatory Visit | Attending: Radiation Oncology | Admitting: Radiation Oncology

## 2013-06-17 VITALS — BP 169/75 | HR 89 | Temp 97.7°F | Ht 64.5 in | Wt 176.2 lb

## 2013-06-17 DIAGNOSIS — C50212 Malignant neoplasm of upper-inner quadrant of left female breast: Secondary | ICD-10-CM

## 2013-06-17 DIAGNOSIS — Z79899 Other long term (current) drug therapy: Secondary | ICD-10-CM | POA: Diagnosis not present

## 2013-06-17 DIAGNOSIS — L589 Radiodermatitis, unspecified: Secondary | ICD-10-CM | POA: Diagnosis not present

## 2013-06-17 DIAGNOSIS — C50219 Malignant neoplasm of upper-inner quadrant of unspecified female breast: Secondary | ICD-10-CM | POA: Diagnosis not present

## 2013-06-17 DIAGNOSIS — Z510 Encounter for antineoplastic radiation therapy: Secondary | ICD-10-CM | POA: Diagnosis not present

## 2013-06-17 DIAGNOSIS — R5381 Other malaise: Secondary | ICD-10-CM | POA: Diagnosis not present

## 2013-06-17 NOTE — Progress Notes (Signed)
[name] has had 7 fractions to her left breast.  She denies pain.  She does have an infected second toe and is wearing a walking boot.  She denies an increase in fatigue.  The skin on her left breast is intact and pink. She is using radiaplex twice a day.

## 2013-06-17 NOTE — Progress Notes (Signed)
Department of Radiation Oncology  Phone:  [phone] Fax:        [phone]  Weekly Treatment Note    Name: [name] Date: 06/17/2013 MRN: [id number] DOB: 1946-03-08   Current dose: 17.5 Gy  Current fraction: 7   MEDICATIONS: Current Outpatient Prescriptions  Medication Sig Dispense Refill  . allopurinol (ZYLOPRIM) 100 MG tablet Take 100 mg by mouth 2 (two) times daily.      . Alpha-Lipoic Acid 200 MG CAPS Take 200 mg by mouth 2 (two) times daily.      Marland Kitchen aspirin 325 MG tablet Take 325 mg by mouth at bedtime.       . carvedilol (COREG) 12.5 MG tablet Take 1 tablet (12.5 mg total) by mouth 2 (two) times daily with a meal.  180 tablet  0  . cholecalciferol (VITAMIN D) 1000 UNITS tablet Take 2,000 Units by mouth daily.      . Coenzyme Q10 (CO Q-10) 100 MG CAPS Take 100 mg by mouth 2 (two) times daily.      Marland Kitchen COLCRYS 0.6 MG tablet Take 0.6 mg by mouth daily as needed (for gout flareups).       . doxycycline (VIBRA-TABS) 100 MG tablet Take 1 tablet (100 mg total) by mouth 2 (two) times daily.  28 tablet  0  . fish oil-omega-3 fatty acids 1000 MG capsule Take 1 g by mouth 2 (two) times daily.       Marland Kitchen glimepiride (AMARYL) 4 MG tablet Take 4 mg by mouth 2 (two) times daily.       Marland Kitchen glucose blood (PRODIGY TEST) test strip Use as instructed  100 each  4  . hyaluronate sodium (RADIAPLEXRX) GEL Apply 1 application topically 2 (two) times daily. Apply after rad tx  And bedtime daily,not 4 hours prior to rad tx      . hydrochlorothiazide (HYDRODIURIL) 12.5 MG tablet Take 1 tablet (12.5 mg total) by mouth daily.  90 tablet  1  . insulin glargine (LANTUS) 100 UNIT/ML injection Inject 35 Units into the skin every morning.       Marland Kitchen MELATONIN PO Take 1 tablet by mouth at bedtime.      . metformin (FORTAMET) 1000 MG (OSM) 24 hr tablet Take 1,000 mg by mouth 2 (two) times daily with a meal.      . mupirocin cream (BACTROBAN) 2 % Apply topically 2 (two) times daily.  15 g  0  . non-metallic  deodorant (ALRA) MISC Apply 1 application topically daily as needed.      . Polyvinyl Alcohol-Povidone (REFRESH OP) Place 1 drop into both eyes daily as needed (dry eyes).      . rosuvastatin (CRESTOR) 20 MG tablet Take 20 mg by mouth daily.      . valsartan (DIOVAN) 320 MG tablet Take 320 mg by mouth daily.      Marland Kitchen anastrozole (ARIMIDEX) 1 MG tablet Take 1 tablet (1 mg total) by mouth daily.  90 tablet  4  . aspirin-acetaminophen-caffeine (EXCEDRIN MIGRAINE) [id number] MG per tablet Take 1-2 tablets by mouth every 6 (six) hours as needed (sinus headache).      . oxyCODONE (OXY IR/ROXICODONE) 5 MG immediate release tablet Take 1 tablet (5 mg total) by mouth every 4 (four) hours as needed for moderate pain.  30 tablet  0   No current facility-administered medications for this encounter.     ALLERGIES: Atenolol; Contrast media; Latex; and Omnipaque   LABORATORY DATA:  Lab Results  Component  Value Date   WBC 8.2 06/08/2013   HGB 12.1 06/08/2013   HCT 36.3 06/08/2013   MCV 87.9 06/08/2013   PLT 243 06/08/2013   Lab Results  Component Value Date   NA 139 06/08/2013   K 4.2 06/08/2013   CL 102 06/08/2013   CO2 21 06/08/2013   Lab Results  Component Value Date   ALT 23 04/28/2013   AST 21 04/28/2013   ALKPHOS 78 04/28/2013   BILITOT 0.37 04/28/2013     NARRATIVE: [name] was seen today for weekly treatment management. The chart was checked and the patient's films were reviewed. The patient states she is doing well overall. She denies pain in the breast area. No increase in fatigue.  PHYSICAL EXAMINATION: height is 5' 4.5" (1.638 m) and weight is 176 lb 3.2 oz (79.924 kg). Her temperature is 97.7 F (36.5 C). Her blood pressure is 169/75 and her pulse is 89.      mild erythema in the treatment area.  ASSESSMENT: The patient is doing satisfactorily with treatment.  PLAN: We will continue with the patient's radiation treatment as planned.

## 2013-06-20 ENCOUNTER — Ambulatory Visit
Admission: RE | Admit: 2013-06-20 | Discharge: 2013-06-20 | Disposition: A | Discharge status: Home / Self Care | Payer: Medicare Other | Source: Ambulatory Visit | Attending: Radiation Oncology | Admitting: Radiation Oncology

## 2013-06-20 DIAGNOSIS — Z79899 Other long term (current) drug therapy: Secondary | ICD-10-CM | POA: Diagnosis not present

## 2013-06-20 DIAGNOSIS — L589 Radiodermatitis, unspecified: Secondary | ICD-10-CM | POA: Diagnosis not present

## 2013-06-20 DIAGNOSIS — Z510 Encounter for antineoplastic radiation therapy: Secondary | ICD-10-CM | POA: Diagnosis not present

## 2013-06-20 DIAGNOSIS — C50219 Malignant neoplasm of upper-inner quadrant of unspecified female breast: Secondary | ICD-10-CM | POA: Diagnosis not present

## 2013-06-20 DIAGNOSIS — R5381 Other malaise: Secondary | ICD-10-CM | POA: Diagnosis not present

## 2013-06-21 ENCOUNTER — Ambulatory Visit
Admission: RE | Admit: 2013-06-21 | Discharge: 2013-06-21 | Disposition: A | Discharge status: Home / Self Care | Payer: Medicare Other | Source: Ambulatory Visit | Attending: Radiation Oncology | Admitting: Radiation Oncology

## 2013-06-21 DIAGNOSIS — R5381 Other malaise: Secondary | ICD-10-CM | POA: Diagnosis not present

## 2013-06-21 DIAGNOSIS — Z79899 Other long term (current) drug therapy: Secondary | ICD-10-CM | POA: Diagnosis not present

## 2013-06-21 DIAGNOSIS — C50219 Malignant neoplasm of upper-inner quadrant of unspecified female breast: Secondary | ICD-10-CM | POA: Diagnosis not present

## 2013-06-21 DIAGNOSIS — Z510 Encounter for antineoplastic radiation therapy: Secondary | ICD-10-CM | POA: Diagnosis not present

## 2013-06-21 DIAGNOSIS — L589 Radiodermatitis, unspecified: Secondary | ICD-10-CM | POA: Diagnosis not present

## 2013-06-22 ENCOUNTER — Ambulatory Visit
Admission: RE | Admit: 2013-06-22 | Discharge: 2013-06-22 | Disposition: A | Discharge status: Home / Self Care | Payer: Medicare Other | Source: Ambulatory Visit | Attending: Radiation Oncology | Admitting: Radiation Oncology

## 2013-06-22 DIAGNOSIS — Z79899 Other long term (current) drug therapy: Secondary | ICD-10-CM | POA: Diagnosis not present

## 2013-06-22 DIAGNOSIS — L589 Radiodermatitis, unspecified: Secondary | ICD-10-CM | POA: Diagnosis not present

## 2013-06-22 DIAGNOSIS — R5381 Other malaise: Secondary | ICD-10-CM | POA: Diagnosis not present

## 2013-06-22 DIAGNOSIS — C50219 Malignant neoplasm of upper-inner quadrant of unspecified female breast: Secondary | ICD-10-CM | POA: Diagnosis not present

## 2013-06-22 DIAGNOSIS — Z510 Encounter for antineoplastic radiation therapy: Secondary | ICD-10-CM | POA: Diagnosis not present

## 2013-06-22 DIAGNOSIS — R5383 Other fatigue: Secondary | ICD-10-CM | POA: Diagnosis not present

## 2013-06-23 ENCOUNTER — Ambulatory Visit
Admission: RE | Admit: 2013-06-23 | Discharge: 2013-06-23 | Disposition: A | Discharge status: Home / Self Care | Payer: Medicare Other | Source: Ambulatory Visit | Attending: Radiation Oncology | Admitting: Radiation Oncology

## 2013-06-23 DIAGNOSIS — C50219 Malignant neoplasm of upper-inner quadrant of unspecified female breast: Secondary | ICD-10-CM | POA: Diagnosis not present

## 2013-06-23 DIAGNOSIS — R5383 Other fatigue: Secondary | ICD-10-CM | POA: Diagnosis not present

## 2013-06-23 DIAGNOSIS — Z510 Encounter for antineoplastic radiation therapy: Secondary | ICD-10-CM | POA: Diagnosis not present

## 2013-06-23 DIAGNOSIS — R5381 Other malaise: Secondary | ICD-10-CM | POA: Diagnosis not present

## 2013-06-23 DIAGNOSIS — L589 Radiodermatitis, unspecified: Secondary | ICD-10-CM | POA: Diagnosis not present

## 2013-06-23 DIAGNOSIS — Z79899 Other long term (current) drug therapy: Secondary | ICD-10-CM | POA: Diagnosis not present

## 2013-06-24 ENCOUNTER — Ambulatory Visit
Admission: RE | Admit: 2013-06-24 | Discharge: 2013-06-24 | Disposition: A | Discharge status: Home / Self Care | Payer: Medicare Other | Source: Ambulatory Visit | Attending: Radiation Oncology | Admitting: Radiation Oncology

## 2013-06-24 ENCOUNTER — Encounter: Payer: Self-pay | Admitting: Radiation Oncology

## 2013-06-24 VITALS — BP 163/74 | HR 87 | Temp 98.8°F | Resp 20 | Wt 174.0 lb

## 2013-06-24 DIAGNOSIS — C50212 Malignant neoplasm of upper-inner quadrant of left female breast: Secondary | ICD-10-CM

## 2013-06-24 DIAGNOSIS — L589 Radiodermatitis, unspecified: Secondary | ICD-10-CM | POA: Diagnosis not present

## 2013-06-24 DIAGNOSIS — Z510 Encounter for antineoplastic radiation therapy: Secondary | ICD-10-CM | POA: Diagnosis not present

## 2013-06-24 DIAGNOSIS — Z79899 Other long term (current) drug therapy: Secondary | ICD-10-CM | POA: Diagnosis not present

## 2013-06-24 DIAGNOSIS — C50219 Malignant neoplasm of upper-inner quadrant of unspecified female breast: Secondary | ICD-10-CM | POA: Diagnosis not present

## 2013-06-24 DIAGNOSIS — L97509 Non-pressure chronic ulcer of other part of unspecified foot with unspecified severity: Secondary | ICD-10-CM | POA: Diagnosis not present

## 2013-06-24 DIAGNOSIS — R5381 Other malaise: Secondary | ICD-10-CM | POA: Diagnosis not present

## 2013-06-24 NOTE — Progress Notes (Signed)
Department of Radiation Oncology  Phone:  [phone] Fax:        [phone]  Weekly Treatment Note    Name: [name] Date: 06/24/2013 MRN: [id number] DOB: [birth year]   Current dose: 30 Gy  Current fraction: 12   MEDICATIONS: Current Outpatient Prescriptions  Medication Sig Dispense Refill  . allopurinol (ZYLOPRIM) 100 MG tablet Take 100 mg by mouth 2 (two) times daily.      . Alpha-Lipoic Acid 200 MG CAPS Take 200 mg by mouth 2 (two) times daily.      Marland Kitchen anastrozole (ARIMIDEX) 1 MG tablet Take 1 tablet (1 mg total) by mouth daily.  90 tablet  4  . aspirin 325 MG tablet Take 325 mg by mouth at bedtime.       Marland Kitchen aspirin-acetaminophen-caffeine (EXCEDRIN MIGRAINE) 250-250-65 MG per tablet Take 1-2 tablets by mouth every 6 (six) hours as needed (sinus headache).      . carvedilol (COREG) 12.5 MG tablet Take 1 tablet (12.5 mg total) by mouth 2 (two) times daily with a meal.  180 tablet  0  . cholecalciferol (VITAMIN D) 1000 UNITS tablet Take 2,000 Units by mouth daily.      . Coenzyme Q10 (CO Q-10) 100 MG CAPS Take 100 mg by mouth 2 (two) times daily.      Marland Kitchen COLCRYS 0.6 MG tablet Take 0.6 mg by mouth daily as needed (for gout flareups).       . doxycycline (VIBRA-TABS) 100 MG tablet Take 1 tablet (100 mg total) by mouth 2 (two) times daily.  28 tablet  0  . fish oil-omega-3 fatty acids 1000 MG capsule Take 1 g by mouth 2 (two) times daily.       Marland Kitchen glimepiride (AMARYL) 4 MG tablet Take 4 mg by mouth 2 (two) times daily.       Marland Kitchen glucose blood (PRODIGY TEST) test strip Use as instructed  100 each  4  . hyaluronate sodium (RADIAPLEXRX) GEL Apply 1 application topically 2 (two) times daily. Apply after rad tx  And bedtime daily,not 4 hours prior to rad tx      . hydrochlorothiazide (HYDRODIURIL) 12.5 MG tablet Take 1 tablet (12.5 mg total) by mouth daily.  90 tablet  1  . insulin glargine (LANTUS) 100 UNIT/ML injection Inject 35 Units into the skin every morning.       Marland Kitchen MELATONIN  PO Take 1 tablet by mouth at bedtime.      . metformin (FORTAMET) 1000 MG (OSM) 24 hr tablet Take 1,000 mg by mouth 2 (two) times daily with a meal.      . mupirocin cream (BACTROBAN) 2 % Apply topically 2 (two) times daily.  15 g  0  . non-metallic deodorant (ALRA) MISC Apply 1 application topically daily as needed.      Marland Kitchen oxyCODONE (OXY IR/ROXICODONE) 5 MG immediate release tablet Take 1 tablet (5 mg total) by mouth every 4 (four) hours as needed for moderate pain.  30 tablet  0  . Polyvinyl Alcohol-Povidone (REFRESH OP) Place 1 drop into both eyes daily as needed (dry eyes).      . rosuvastatin (CRESTOR) 20 MG tablet Take 20 mg by mouth daily.      . valsartan (DIOVAN) 320 MG tablet Take 320 mg by mouth daily.       No current facility-administered medications for this encounter.     ALLERGIES: Atenolol; Contrast media; Latex; and Omnipaque   LABORATORY DATA:  Lab Results  Component  Value Date   WBC 8.2 06/08/2013   HGB 12.1 06/08/2013   HCT 36.3 06/08/2013   MCV 87.9 06/08/2013   PLT 243 06/08/2013   Lab Results  Component Value Date   NA 139 06/08/2013   K 4.2 06/08/2013   CL 102 06/08/2013   CO2 21 06/08/2013   Lab Results  Component Value Date   ALT 23 04/28/2013   AST 21 04/28/2013   ALKPHOS 78 04/28/2013   BILITOT 0.37 04/28/2013     NARRATIVE: [name] was seen today for weekly treatment management. The chart was checked and the patient's films were reviewed. The patient states she is doing well in terms of her radiation treatment. No significant skin pain. She feels that her foot has flared back up and she is going to see the surgeon later today regarding this.  PHYSICAL EXAMINATION: weight is 174 lb (78.926 kg). Her oral temperature is 98.8 F (37.1 C). Her blood pressure is 163/74 and her pulse is 87. Her respiration is 20.      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 Overall her skin looks good. No moist desquamation.  ASSESSMENT: The  patient is doing satisfactorily with treatment.  PLAN: We will continue with the patient's radiation treatment as planned.

## 2013-06-24 NOTE — Progress Notes (Signed)
Weekly rad tx lt breast, 12 completed, erythema slight,skin intact, using radiaplex bid, declined to weigh, has infection in 2nd big toe on left foot, completed doxycycline Tuesday, but has flared up again stated, patient is diabetic, appetite good, some fatigued Conscious effort trying to lose weight 2:27 PM

## 2013-06-27 ENCOUNTER — Ambulatory Visit
Admission: RE | Admit: 2013-06-27 | Discharge: 2013-06-27 | Disposition: A | Discharge status: Home / Self Care | Payer: Medicare Other | Source: Ambulatory Visit | Attending: Radiation Oncology | Admitting: Radiation Oncology

## 2013-06-27 DIAGNOSIS — Z510 Encounter for antineoplastic radiation therapy: Secondary | ICD-10-CM | POA: Diagnosis not present

## 2013-06-27 DIAGNOSIS — L97509 Non-pressure chronic ulcer of other part of unspecified foot with unspecified severity: Secondary | ICD-10-CM | POA: Diagnosis not present

## 2013-06-27 DIAGNOSIS — C50219 Malignant neoplasm of upper-inner quadrant of unspecified female breast: Secondary | ICD-10-CM | POA: Diagnosis not present

## 2013-06-27 DIAGNOSIS — Z79899 Other long term (current) drug therapy: Secondary | ICD-10-CM | POA: Diagnosis not present

## 2013-06-27 DIAGNOSIS — R5383 Other fatigue: Secondary | ICD-10-CM | POA: Diagnosis not present

## 2013-06-27 DIAGNOSIS — M86179 Other acute osteomyelitis, unspecified ankle and foot: Secondary | ICD-10-CM | POA: Diagnosis not present

## 2013-06-27 DIAGNOSIS — L589 Radiodermatitis, unspecified: Secondary | ICD-10-CM | POA: Diagnosis not present

## 2013-06-27 DIAGNOSIS — E1149 Type 2 diabetes mellitus with other diabetic neurological complication: Secondary | ICD-10-CM | POA: Diagnosis not present

## 2013-06-27 DIAGNOSIS — R5381 Other malaise: Secondary | ICD-10-CM | POA: Diagnosis not present

## 2013-06-28 ENCOUNTER — Ambulatory Visit
Admission: RE | Admit: 2013-06-28 | Discharge: 2013-06-28 | Disposition: A | Discharge status: Home / Self Care | Payer: Medicare Other | Source: Ambulatory Visit | Attending: Radiation Oncology | Admitting: Radiation Oncology

## 2013-06-28 DIAGNOSIS — L589 Radiodermatitis, unspecified: Secondary | ICD-10-CM | POA: Diagnosis not present

## 2013-06-28 DIAGNOSIS — R5383 Other fatigue: Secondary | ICD-10-CM | POA: Diagnosis not present

## 2013-06-28 DIAGNOSIS — C50219 Malignant neoplasm of upper-inner quadrant of unspecified female breast: Secondary | ICD-10-CM | POA: Diagnosis not present

## 2013-06-28 DIAGNOSIS — Z510 Encounter for antineoplastic radiation therapy: Secondary | ICD-10-CM | POA: Diagnosis not present

## 2013-06-28 DIAGNOSIS — Z79899 Other long term (current) drug therapy: Secondary | ICD-10-CM | POA: Diagnosis not present

## 2013-06-28 DIAGNOSIS — R5381 Other malaise: Secondary | ICD-10-CM | POA: Diagnosis not present

## 2013-06-29 ENCOUNTER — Ambulatory Visit
Admission: RE | Admit: 2013-06-29 | Discharge: 2013-06-29 | Disposition: A | Discharge status: Home / Self Care | Payer: Medicare Other | Source: Ambulatory Visit | Attending: Radiation Oncology | Admitting: Radiation Oncology

## 2013-06-29 DIAGNOSIS — Z79899 Other long term (current) drug therapy: Secondary | ICD-10-CM | POA: Diagnosis not present

## 2013-06-29 DIAGNOSIS — C50219 Malignant neoplasm of upper-inner quadrant of unspecified female breast: Secondary | ICD-10-CM | POA: Diagnosis not present

## 2013-06-29 DIAGNOSIS — Z510 Encounter for antineoplastic radiation therapy: Secondary | ICD-10-CM | POA: Diagnosis not present

## 2013-06-29 DIAGNOSIS — R5381 Other malaise: Secondary | ICD-10-CM | POA: Diagnosis not present

## 2013-06-29 DIAGNOSIS — L589 Radiodermatitis, unspecified: Secondary | ICD-10-CM | POA: Diagnosis not present

## 2013-06-30 ENCOUNTER — Ambulatory Visit
Admission: RE | Admit: 2013-06-30 | Discharge: 2013-06-30 | Disposition: A | Discharge status: Home / Self Care | Payer: Medicare Other | Source: Ambulatory Visit | Attending: Radiation Oncology | Admitting: Radiation Oncology

## 2013-06-30 DIAGNOSIS — R5381 Other malaise: Secondary | ICD-10-CM | POA: Diagnosis not present

## 2013-06-30 DIAGNOSIS — L589 Radiodermatitis, unspecified: Secondary | ICD-10-CM | POA: Diagnosis not present

## 2013-06-30 DIAGNOSIS — Z510 Encounter for antineoplastic radiation therapy: Secondary | ICD-10-CM | POA: Diagnosis not present

## 2013-06-30 DIAGNOSIS — C50219 Malignant neoplasm of upper-inner quadrant of unspecified female breast: Secondary | ICD-10-CM | POA: Diagnosis not present

## 2013-06-30 DIAGNOSIS — Z79899 Other long term (current) drug therapy: Secondary | ICD-10-CM | POA: Diagnosis not present

## 2013-07-01 ENCOUNTER — Ambulatory Visit
Admission: RE | Admit: 2013-07-01 | Discharge: 2013-07-01 | Disposition: A | Discharge status: Home / Self Care | Payer: Medicare Other | Source: Ambulatory Visit | Attending: Radiation Oncology | Admitting: Radiation Oncology

## 2013-07-01 ENCOUNTER — Encounter: Payer: Self-pay | Admitting: Radiation Oncology

## 2013-07-01 ENCOUNTER — Ambulatory Visit: Payer: Medicare Other

## 2013-07-01 VITALS — BP 145/56 | HR 92 | Temp 98.4°F | Resp 20 | Wt 178.5 lb

## 2013-07-01 DIAGNOSIS — C50212 Malignant neoplasm of upper-inner quadrant of left female breast: Secondary | ICD-10-CM

## 2013-07-01 DIAGNOSIS — R5383 Other fatigue: Secondary | ICD-10-CM | POA: Diagnosis not present

## 2013-07-01 DIAGNOSIS — Z79899 Other long term (current) drug therapy: Secondary | ICD-10-CM | POA: Diagnosis not present

## 2013-07-01 DIAGNOSIS — C50219 Malignant neoplasm of upper-inner quadrant of unspecified female breast: Secondary | ICD-10-CM | POA: Diagnosis not present

## 2013-07-01 DIAGNOSIS — Z510 Encounter for antineoplastic radiation therapy: Secondary | ICD-10-CM | POA: Diagnosis not present

## 2013-07-01 DIAGNOSIS — L589 Radiodermatitis, unspecified: Secondary | ICD-10-CM | POA: Diagnosis not present

## 2013-07-01 DIAGNOSIS — R5381 Other malaise: Secondary | ICD-10-CM | POA: Diagnosis not present

## 2013-07-01 NOTE — Progress Notes (Signed)
Weekly rad txs, 17/20 left breast, completed erythema ,skin intact,, swelling stated left breast,  radiaplex bid, feeling the sunburned effect states patient, appetite good, no noticeable fatigue , tooked ortho static b/p per her request , wnl no dizzy ness stated 2:33 PM

## 2013-07-01 NOTE — Progress Notes (Signed)
Complex simulation note  Diagnosis: Breast cancer  Narrative The patient has initially been planned to receive a course of whole breast radiation to a dose of 42.5 gray in 17 fractions at 2.5 gray per fraction. The patient will now receive an additional boost to the seroma cavity which has been contoured. This will correspond to a boost of 7.5 gray in 3 fractions at 2.5 gray per fraction. To accomplish this, an additional 3 customized blocks have been designed for this purpose. A complex isodose plan is requested to ensure that the target area is adequately covered with radiation dose and that the nearby normal structures such as the lung are adequately spared. The patient's final total dose will be 50 gray.   ------------------------------------------------  Jodelle Gross, MD, PhD

## 2013-07-01 NOTE — Progress Notes (Signed)
Department of Radiation Oncology  Phone:  [phone] Fax:        [phone]  Weekly Treatment Note    Name: [name] Date: 07/01/2013 MRN: [id number] DOB: [birth year]   Current dose: 42.5 Gy  Current fraction: 17   MEDICATIONS: Current Outpatient Prescriptions  Medication Sig Dispense Refill  . allopurinol (ZYLOPRIM) 100 MG tablet Take 100 mg by mouth 2 (two) times daily.      . Alpha-Lipoic Acid 200 MG CAPS Take 200 mg by mouth 2 (two) times daily.      Marland Kitchen anastrozole (ARIMIDEX) 1 MG tablet Take 1 tablet (1 mg total) by mouth daily.  90 tablet  4  . aspirin 325 MG tablet Take 325 mg by mouth at bedtime.       Marland Kitchen aspirin-acetaminophen-caffeine (EXCEDRIN MIGRAINE) 250-250-65 MG per tablet Take 1-2 tablets by mouth every 6 (six) hours as needed (sinus headache).      . carvedilol (COREG) 12.5 MG tablet Take 1 tablet (12.5 mg total) by mouth 2 (two) times daily with a meal.  180 tablet  0  . cholecalciferol (VITAMIN D) 1000 UNITS tablet Take 2,000 Units by mouth daily.      . Coenzyme Q10 (CO Q-10) 100 MG CAPS Take 100 mg by mouth 2 (two) times daily.      Marland Kitchen COLCRYS 0.6 MG tablet Take 0.6 mg by mouth daily as needed (for gout flareups).       . doxycycline (VIBRA-TABS) 100 MG tablet Take 1 tablet (100 mg total) by mouth 2 (two) times daily.  28 tablet  0  . fish oil-omega-3 fatty acids 1000 MG capsule Take 1 g by mouth 2 (two) times daily.       Marland Kitchen glimepiride (AMARYL) 4 MG tablet Take 4 mg by mouth 2 (two) times daily.       Marland Kitchen glucose blood (PRODIGY TEST) test strip Use as instructed  100 each  4  . hyaluronate sodium (RADIAPLEXRX) GEL Apply 1 application topically 2 (two) times daily. Apply after rad tx  And bedtime daily,not 4 hours prior to rad tx      . hydrochlorothiazide (HYDRODIURIL) 12.5 MG tablet Take 1 tablet (12.5 mg total) by mouth daily.  90 tablet  1  . insulin glargine (LANTUS) 100 UNIT/ML injection Inject 35 Units into the skin every morning.       Marland Kitchen  MELATONIN PO Take 1 tablet by mouth at bedtime.      . metformin (FORTAMET) 1000 MG (OSM) 24 hr tablet Take 1,000 mg by mouth 2 (two) times daily with a meal.      . mupirocin cream (BACTROBAN) 2 % Apply topically 2 (two) times daily.  15 g  0  . non-metallic deodorant (ALRA) MISC Apply 1 application topically daily as needed.      Marland Kitchen oxyCODONE (OXY IR/ROXICODONE) 5 MG immediate release tablet Take 1 tablet (5 mg total) by mouth every 4 (four) hours as needed for moderate pain.  30 tablet  0  . Polyvinyl Alcohol-Povidone (REFRESH OP) Place 1 drop into both eyes daily as needed (dry eyes).      . rosuvastatin (CRESTOR) 20 MG tablet Take 20 mg by mouth daily.      . valsartan (DIOVAN) 320 MG tablet Take 320 mg by mouth daily.       No current facility-administered medications for this encounter.     ALLERGIES: Atenolol; Contrast media; Latex; and Omnipaque   LABORATORY DATA:  Lab Results  Component  Value Date   WBC 8.2 06/08/2013   HGB 12.1 06/08/2013   HCT 36.3 06/08/2013   MCV 87.9 06/08/2013   PLT 243 06/08/2013   Lab Results  Component Value Date   NA 139 06/08/2013   K 4.2 06/08/2013   CL 102 06/08/2013   CO2 21 06/08/2013   Lab Results  Component Value Date   ALT 23 04/28/2013   AST 21 04/28/2013   ALKPHOS 78 04/28/2013   BILITOT 0.37 04/28/2013     NARRATIVE: [name] was seen today for weekly treatment management. The chart was checked and the patient's films were reviewed. The patient has noticed some increased skin reaction over the last week. No significant fatigue. Her appetite has remained good.  PHYSICAL EXAMINATION: weight is 178 lb 8 oz (80.967 kg). Her oral temperature is 98.4 F (36.9 C). Her blood pressure is 145/56 and her pulse is 92. Her respiration is 20.      the patient's skin shows some diffuse erythema, no moist desquamation. Overall her skin looks good for where we are in her treatment.  ASSESSMENT: The patient is doing satisfactorily with treatment.  PLAN: We  will continue with the patient's radiation treatment as planned. I am pleased with how the patient's skin looks at this time. She will continue her current skin cream use and we'll continue this for several weeks after treatment is completed.

## 2013-07-04 ENCOUNTER — Ambulatory Visit
Admission: RE | Admit: 2013-07-04 | Discharge: 2013-07-04 | Disposition: A | Discharge status: Home / Self Care | Payer: Medicare Other | Source: Ambulatory Visit | Attending: Radiation Oncology | Admitting: Radiation Oncology

## 2013-07-04 ENCOUNTER — Encounter: Payer: Self-pay | Admitting: Radiation Oncology

## 2013-07-04 ENCOUNTER — Other Ambulatory Visit (HOSPITAL_COMMUNITY): Payer: Self-pay | Admitting: Orthopedic Surgery

## 2013-07-04 DIAGNOSIS — Z79899 Other long term (current) drug therapy: Secondary | ICD-10-CM | POA: Diagnosis not present

## 2013-07-04 DIAGNOSIS — R5383 Other fatigue: Secondary | ICD-10-CM | POA: Diagnosis not present

## 2013-07-04 DIAGNOSIS — L589 Radiodermatitis, unspecified: Secondary | ICD-10-CM | POA: Diagnosis not present

## 2013-07-04 DIAGNOSIS — R5381 Other malaise: Secondary | ICD-10-CM | POA: Diagnosis not present

## 2013-07-04 DIAGNOSIS — Z510 Encounter for antineoplastic radiation therapy: Secondary | ICD-10-CM | POA: Diagnosis not present

## 2013-07-04 DIAGNOSIS — C50219 Malignant neoplasm of upper-inner quadrant of unspecified female breast: Secondary | ICD-10-CM | POA: Diagnosis not present

## 2013-07-04 NOTE — Progress Notes (Signed)
Simulation Verification Note Breast boost  outpatient  The patient was brought to the treatment unit and placed in the planned treatment position. The clinical setup was verified. Then port films were obtained and uploaded to the radiation oncology medical record software.  The treatment beams were carefully compared against the planned radiation fields. The position location and shape of the radiation fields was reviewed. They targeted volume of tissue appears to be appropriately covered by the radiation beams. Organs at risk appear to be excluded as planned.  Based on my personal review, I approved the simulation verification. The patient's treatment will proceed as planned.  -----------------------------------  Eppie Gibson, MD

## 2013-07-05 ENCOUNTER — Ambulatory Visit
Admission: RE | Admit: 2013-07-05 | Discharge: 2013-07-05 | Disposition: A | Discharge status: Home / Self Care | Payer: Medicare Other | Source: Ambulatory Visit | Attending: Radiation Oncology | Admitting: Radiation Oncology

## 2013-07-05 ENCOUNTER — Encounter (HOSPITAL_COMMUNITY): Payer: Self-pay | Admitting: Pharmacy Technician

## 2013-07-05 ENCOUNTER — Ambulatory Visit: Payer: Medicare Other

## 2013-07-05 DIAGNOSIS — Z79899 Other long term (current) drug therapy: Secondary | ICD-10-CM | POA: Diagnosis not present

## 2013-07-05 DIAGNOSIS — R5381 Other malaise: Secondary | ICD-10-CM | POA: Diagnosis not present

## 2013-07-05 DIAGNOSIS — Z510 Encounter for antineoplastic radiation therapy: Secondary | ICD-10-CM | POA: Diagnosis not present

## 2013-07-05 DIAGNOSIS — C50219 Malignant neoplasm of upper-inner quadrant of unspecified female breast: Secondary | ICD-10-CM | POA: Diagnosis not present

## 2013-07-05 DIAGNOSIS — L589 Radiodermatitis, unspecified: Secondary | ICD-10-CM | POA: Diagnosis not present

## 2013-07-06 ENCOUNTER — Encounter: Payer: Self-pay | Admitting: Radiation Oncology

## 2013-07-06 ENCOUNTER — Ambulatory Visit
Admission: RE | Admit: 2013-07-06 | Discharge: 2013-07-06 | Disposition: A | Discharge status: Home / Self Care | Payer: Medicare Other | Source: Ambulatory Visit | Attending: Radiation Oncology | Admitting: Radiation Oncology

## 2013-07-06 VITALS — BP 144/66 | HR 88 | Temp 98.3°F | Ht 64.5 in | Wt 178.2 lb

## 2013-07-06 DIAGNOSIS — C50212 Malignant neoplasm of upper-inner quadrant of left female breast: Secondary | ICD-10-CM

## 2013-07-06 DIAGNOSIS — Z79899 Other long term (current) drug therapy: Secondary | ICD-10-CM | POA: Diagnosis not present

## 2013-07-06 DIAGNOSIS — R5381 Other malaise: Secondary | ICD-10-CM | POA: Diagnosis not present

## 2013-07-06 DIAGNOSIS — C50219 Malignant neoplasm of upper-inner quadrant of unspecified female breast: Secondary | ICD-10-CM | POA: Diagnosis not present

## 2013-07-06 DIAGNOSIS — R5383 Other fatigue: Secondary | ICD-10-CM | POA: Diagnosis not present

## 2013-07-06 DIAGNOSIS — Z510 Encounter for antineoplastic radiation therapy: Secondary | ICD-10-CM | POA: Diagnosis not present

## 2013-07-06 DIAGNOSIS — L589 Radiodermatitis, unspecified: Secondary | ICD-10-CM | POA: Diagnosis not present

## 2013-07-06 MED ORDER — BIAFINE EX EMUL
CUTANEOUS | Status: DC | PRN
  Administered 2013-07-06: 16:00:00 via TOPICAL

## 2013-07-06 NOTE — Progress Notes (Signed)
[facility] flyer given to patient and she is interested in coming 08/01/13  Session starting then, e-mailed Ottis Stain,

## 2013-07-06 NOTE — Progress Notes (Signed)
  Radiation Oncology         [phone] ________________________________  Name: [name] MRN: [id number]  Date: 07/06/2013  DOB: 1945-12-08  Weekly Radiation Therapy Management  Current Dose: 50 Gy     Planned Dose:  50 Gy  Narrative . . . . . . . . The patient presents for routine under treatment assessment.                                   The patient is without complaint. She has some mild fatigue and mild itching/discomfort in the breast. She is happy to complete her radiation therapy today.                                 Set-up films were reviewed.                                 The chart was checked. Physical Findings. . .  height is 5' 4.5" (1.638 m) and weight is 178 lb 3.2 oz (80.831 kg). Her temperature is 98.3 F (36.8 C). Her blood pressure is 144/66 and her pulse is 88. . Weight essentially stable.  Erythema and radiation dermatitis noted in the left breast. No moist desquamation. Impression . . . . . . . The patient is tolerated radiation well. Plan . . . . . . . . . . . Marland Kitchen routine followup in one month. Patient has been given Biafine for her skin issue.  ________________________________   Blair Promise, PhD, MD

## 2013-07-06 NOTE — Progress Notes (Signed)
[name] has completed 20 fractions to her left breast.  She denies pain today but has occasionally sharp pains around her nipple area.  She denies fatigue.  The skin on her left breast and left underarm is red with a scattered, red rash on her mid chest.  She reports it is occasionally itchy.  She is currently using radiaplex.  She is scheduled to have her second toe on her left food amputated on Friday morning with Dr. Rosita Kea.  She has been given a 1 month follow up card.

## 2013-07-07 ENCOUNTER — Ambulatory Visit: Payer: Medicare Other

## 2013-07-07 ENCOUNTER — Other Ambulatory Visit (HOSPITAL_COMMUNITY): Payer: Self-pay | Admitting: Orthopedic Surgery

## 2013-07-07 ENCOUNTER — Encounter (HOSPITAL_COMMUNITY): Payer: Self-pay | Admitting: *Deleted

## 2013-07-07 MED ORDER — CEFAZOLIN SODIUM-DEXTROSE 2-3 GM-% IV SOLR
2.0000 g | INTRAVENOUS | Status: AC
  Administered 2013-07-08: 2 g via INTRAVENOUS
  Filled 2013-07-07: qty 50

## 2013-07-07 NOTE — Progress Notes (Signed)
Pt notified of surgery time change. Instructed pt to be here at 6:40 AM tomorrow morning. She voiced understanding.

## 2013-07-08 ENCOUNTER — Ambulatory Visit (HOSPITAL_COMMUNITY): Payer: Medicare Other | Admitting: Anesthesiology

## 2013-07-08 ENCOUNTER — Ambulatory Visit (HOSPITAL_COMMUNITY)
Admission: RE | Admit: 2013-07-08 | Discharge: 2013-07-08 | Disposition: A | Discharge status: Home / Self Care | Payer: Medicare Other | Source: Ambulatory Visit | Attending: Orthopedic Surgery | Admitting: Orthopedic Surgery

## 2013-07-08 ENCOUNTER — Encounter (HOSPITAL_COMMUNITY)
Admission: RE | Disposition: A | Discharge status: Home / Self Care | Payer: Self-pay | Source: Ambulatory Visit | Attending: Orthopedic Surgery

## 2013-07-08 ENCOUNTER — Encounter (HOSPITAL_COMMUNITY): Payer: Medicare Other | Admitting: Anesthesiology

## 2013-07-08 ENCOUNTER — Encounter (HOSPITAL_COMMUNITY): Payer: Self-pay | Admitting: Surgery

## 2013-07-08 DIAGNOSIS — I2510 Atherosclerotic heart disease of native coronary artery without angina pectoris: Secondary | ICD-10-CM | POA: Insufficient documentation

## 2013-07-08 DIAGNOSIS — I129 Hypertensive chronic kidney disease with stage 1 through stage 4 chronic kidney disease, or unspecified chronic kidney disease: Secondary | ICD-10-CM | POA: Diagnosis not present

## 2013-07-08 DIAGNOSIS — I2589 Other forms of chronic ischemic heart disease: Secondary | ICD-10-CM | POA: Insufficient documentation

## 2013-07-08 DIAGNOSIS — E1169 Type 2 diabetes mellitus with other specified complication: Secondary | ICD-10-CM | POA: Diagnosis not present

## 2013-07-08 DIAGNOSIS — Z853 Personal history of malignant neoplasm of breast: Secondary | ICD-10-CM | POA: Diagnosis not present

## 2013-07-08 DIAGNOSIS — J449 Chronic obstructive pulmonary disease, unspecified: Secondary | ICD-10-CM | POA: Diagnosis not present

## 2013-07-08 DIAGNOSIS — M86179 Other acute osteomyelitis, unspecified ankle and foot: Secondary | ICD-10-CM | POA: Diagnosis not present

## 2013-07-08 DIAGNOSIS — L97509 Non-pressure chronic ulcer of other part of unspecified foot with unspecified severity: Secondary | ICD-10-CM | POA: Diagnosis not present

## 2013-07-08 DIAGNOSIS — G5790 Unspecified mononeuropathy of unspecified lower limb: Secondary | ICD-10-CM | POA: Insufficient documentation

## 2013-07-08 DIAGNOSIS — I10 Essential (primary) hypertension: Secondary | ICD-10-CM | POA: Diagnosis not present

## 2013-07-08 DIAGNOSIS — N181 Chronic kidney disease, stage 1: Secondary | ICD-10-CM | POA: Diagnosis not present

## 2013-07-08 DIAGNOSIS — M109 Gout, unspecified: Secondary | ICD-10-CM | POA: Insufficient documentation

## 2013-07-08 DIAGNOSIS — Z951 Presence of aortocoronary bypass graft: Secondary | ICD-10-CM | POA: Diagnosis not present

## 2013-07-08 DIAGNOSIS — M9080 Osteopathy in diseases classified elsewhere, unspecified site: Secondary | ICD-10-CM | POA: Diagnosis not present

## 2013-07-08 DIAGNOSIS — I252 Old myocardial infarction: Secondary | ICD-10-CM | POA: Insufficient documentation

## 2013-07-08 DIAGNOSIS — I739 Peripheral vascular disease, unspecified: Secondary | ICD-10-CM | POA: Diagnosis not present

## 2013-07-08 DIAGNOSIS — E1149 Type 2 diabetes mellitus with other diabetic neurological complication: Secondary | ICD-10-CM | POA: Diagnosis not present

## 2013-07-08 DIAGNOSIS — M869 Osteomyelitis, unspecified: Secondary | ICD-10-CM | POA: Insufficient documentation

## 2013-07-08 DIAGNOSIS — E785 Hyperlipidemia, unspecified: Secondary | ICD-10-CM | POA: Diagnosis not present

## 2013-07-08 DIAGNOSIS — J4489 Other specified chronic obstructive pulmonary disease: Secondary | ICD-10-CM | POA: Insufficient documentation

## 2013-07-08 HISTORY — PX: AMPUTATION: SHX166

## 2013-07-08 LAB — CBC
HCT: 40.3 % (ref 36.0–46.0)
HEMOGLOBIN: 13.3 g/dL (ref 12.0–15.0)
MCH: 29.6 pg (ref 26.0–34.0)
MCHC: 33 g/dL (ref 30.0–36.0)
MCV: 89.6 fL (ref 78.0–100.0)
Platelets: 172 10*3/uL (ref 150–400)
RBC: 4.5 MIL/uL (ref 3.87–5.11)
RDW: 14.5 % (ref 11.5–15.5)
WBC: 7.8 10*3/uL (ref 4.0–10.5)

## 2013-07-08 LAB — APTT: aPTT: 24 seconds (ref 24–37)

## 2013-07-08 LAB — GLUCOSE, CAPILLARY
GLUCOSE-CAPILLARY: 81 mg/dL (ref 70–99)
GLUCOSE-CAPILLARY: 80 mg/dL (ref 70–99)
Glucose-Capillary: 60 mg/dL — ABNORMAL LOW (ref 70–99)
Glucose-Capillary: 98 mg/dL (ref 70–99)
Glucose-Capillary: 85 mg/dL (ref 70–99)

## 2013-07-08 LAB — COMPREHENSIVE METABOLIC PANEL
ALK PHOS: 69 U/L (ref 39–117)
ALT: 23 U/L (ref 0–35)
AST: 22 U/L (ref 0–37)
Albumin: 3.4 g/dL — ABNORMAL LOW (ref 3.5–5.2)
BUN: 42 mg/dL — ABNORMAL HIGH (ref 6–23)
CO2: 23 mEq/L (ref 19–32)
Calcium: 10.2 mg/dL (ref 8.4–10.5)
Chloride: 105 mEq/L (ref 96–112)
Creatinine, Ser: 1.55 mg/dL — ABNORMAL HIGH (ref 0.50–1.10)
GFR calc non Af Amer: 34 mL/min — ABNORMAL LOW (ref >90)
GFR, EST AFRICAN AMERICAN: 39 mL/min — AB (ref >90)
GLUCOSE: 89 mg/dL (ref 70–99)
POTASSIUM: 4.4 meq/L (ref 3.7–5.3)
Sodium: 142 mEq/L (ref 137–147)
TOTAL PROTEIN: 6.7 g/dL (ref 6.0–8.3)
Total Bilirubin: 0.3 mg/dL (ref 0.3–1.2)

## 2013-07-08 LAB — PROTIME-INR
INR: 0.91 (ref 0.00–1.49)
Prothrombin Time: 12.1 seconds (ref 11.6–15.2)

## 2013-07-08 SURGERY — AMPUTATION, FOOT, RAY
Anesthesia: General | Site: Toe | Laterality: Left

## 2013-07-08 MED ORDER — LIDOCAINE HCL (CARDIAC) 20 MG/ML IV SOLN
INTRAVENOUS | Status: AC
  Filled 2013-07-08: qty 5

## 2013-07-08 MED ORDER — ONDANSETRON HCL 4 MG/2ML IJ SOLN
INTRAMUSCULAR | Status: AC
  Filled 2013-07-08: qty 2

## 2013-07-08 MED ORDER — LIDOCAINE HCL 1 % IJ SOLN
INTRAMUSCULAR | Status: DC | PRN
Start: 2013-07-08 — End: 2013-07-08
  Administered 2013-07-08: 80 mg via INTRADERMAL

## 2013-07-08 MED ORDER — PHENYLEPHRINE HCL 10 MG/ML IJ SOLN
INTRAMUSCULAR | Status: DC | PRN
  Administered 2013-07-08: 80 ug via INTRAVENOUS
  Administered 2013-07-08: 120 ug via INTRAVENOUS
  Administered 2013-07-08: 80 ug via INTRAVENOUS

## 2013-07-08 MED ORDER — MIDAZOLAM HCL 5 MG/5ML IJ SOLN
INTRAMUSCULAR | Status: DC | PRN
  Administered 2013-07-08: 2 mg via INTRAVENOUS

## 2013-07-08 MED ORDER — SODIUM CHLORIDE 0.9 % IV SOLN
INTRAVENOUS | Status: DC
  Administered 2013-07-08: 08:00:00 via INTRAVENOUS

## 2013-07-08 MED ORDER — PROPOFOL 10 MG/ML IV BOLUS
INTRAVENOUS | Status: DC | PRN
  Administered 2013-07-08: 200 mg via INTRAVENOUS

## 2013-07-08 MED ORDER — SODIUM CHLORIDE 0.9 % IV SOLN
INTRAVENOUS | Status: DC | PRN
  Administered 2013-07-08: 12:00:00 via INTRAVENOUS

## 2013-07-08 MED ORDER — FENTANYL CITRATE 0.05 MG/ML IJ SOLN
INTRAMUSCULAR | Status: AC
  Filled 2013-07-08: qty 5

## 2013-07-08 MED ORDER — MIDAZOLAM HCL 2 MG/2ML IJ SOLN
INTRAMUSCULAR | Status: AC
  Filled 2013-07-08: qty 2

## 2013-07-08 MED ORDER — ONDANSETRON HCL 4 MG/2ML IJ SOLN
INTRAMUSCULAR | Status: DC | PRN
  Administered 2013-07-08: 4 mg via INTRAVENOUS

## 2013-07-08 MED ORDER — PROPOFOL 10 MG/ML IV BOLUS
INTRAVENOUS | Status: AC
  Filled 2013-07-08: qty 20

## 2013-07-08 MED ORDER — 0.9 % SODIUM CHLORIDE (POUR BTL) OPTIME
TOPICAL | Status: DC | PRN
  Administered 2013-07-08: 1000 mL

## 2013-07-08 MED ORDER — HYDROMORPHONE HCL PF 1 MG/ML IJ SOLN: 0.2500 mg | INTRAMUSCULAR | Status: DC | PRN

## 2013-07-08 SURGICAL SUPPLY — 35 items
BLADE SAW SGTL MED 73X18.5 STR (BLADE) ×2 IMPLANT
BNDG COHESIVE 4X5 TAN STRL (GAUZE/BANDAGES/DRESSINGS) ×3 IMPLANT
BNDG GAUZE ELAST 4 BULKY (GAUZE/BANDAGES/DRESSINGS) ×2 IMPLANT
COVER SURGICAL LIGHT HANDLE (MISCELLANEOUS) ×3 IMPLANT
DRAPE U-SHAPE 47X51 STRL (DRAPES) ×6 IMPLANT
DRSG ADAPTIC 3X8 NADH LF (GAUZE/BANDAGES/DRESSINGS) ×3 IMPLANT
DRSG PAD ABDOMINAL 8X10 ST (GAUZE/BANDAGES/DRESSINGS) ×6 IMPLANT
DURAPREP 26ML APPLICATOR (WOUND CARE) ×3 IMPLANT
ELECT CAUTERY BLADE 6.4 (BLADE) ×2 IMPLANT
ELECT REM PT RETURN 9FT ADLT (ELECTROSURGICAL) ×3
ELECTRODE REM PT RTRN 9FT ADLT (ELECTROSURGICAL) ×1 IMPLANT
GLOVE BIOGEL PI IND STRL 7.5 (GLOVE) IMPLANT
GLOVE BIOGEL PI IND STRL 9 (GLOVE) ×1 IMPLANT
GLOVE BIOGEL PI INDICATOR 7.5 (GLOVE) ×2
GLOVE BIOGEL PI INDICATOR 9 (GLOVE) ×4
GLOVE SS PI 9.0 STRL (GLOVE) ×2 IMPLANT
GLOVE SURG ORTHO 9.0 STRL STRW (GLOVE) ×1 IMPLANT
GLOVE SURG SS PI 6.5 STRL IVOR (GLOVE) ×4 IMPLANT
GLOVE SURG SS PI 7.0 STRL IVOR (GLOVE) ×2 IMPLANT
GLOVE SURG SS PI 7.5 STRL IVOR (GLOVE) ×2 IMPLANT
GOWN STRL REUS W/ TWL LRG LVL3 (GOWN DISPOSABLE) IMPLANT
GOWN STRL REUS W/TWL LRG LVL3 (GOWN DISPOSABLE) ×9
KIT BASIN OR (CUSTOM PROCEDURE TRAY) ×3 IMPLANT
KIT ROOM TURNOVER OR (KITS) ×3 IMPLANT
NS IRRIG 1000ML POUR BTL (IV SOLUTION) ×3 IMPLANT
PACK ORTHO EXTREMITY (CUSTOM PROCEDURE TRAY) ×3 IMPLANT
PAD ARMBOARD 7.5X6 YLW CONV (MISCELLANEOUS) ×6 IMPLANT
SPONGE GAUZE 4X4 12PLY (GAUZE/BANDAGES/DRESSINGS) ×3 IMPLANT
SPONGE LAP 18X18 X RAY DECT (DISPOSABLE) ×3 IMPLANT
STOCKINETTE IMPERVIOUS LG (DRAPES) IMPLANT
SUT ETHILON 2 0 PSLX (SUTURE) ×4 IMPLANT
TOWEL OR 17X24 6PK STRL BLUE (TOWEL DISPOSABLE) ×3 IMPLANT
TOWEL OR 17X26 10 PK STRL BLUE (TOWEL DISPOSABLE) ×3 IMPLANT
UNDERPAD 30X30 INCONTINENT (UNDERPADS AND DIAPERS) ×3 IMPLANT
WATER STERILE IRR 1000ML POUR (IV SOLUTION) ×1 IMPLANT

## 2013-07-08 NOTE — H&P (Signed)
[name] is an 68 y.o. female.   Chief Complaint: Osteomyelitis and ulceration left foot second toe HPI: Patient is a 68 year old woman with diabetes with progressive ulceration and infection and destructive bony changes left foot second toe. She has failed prolonged conservative therapy.  Past Medical History  Diagnosis Date  . Hypertension   . Coronary artery disease     NSTEMI in 05/2009;  LHC with 2 v CAD - > CABG (LIMA-LAD, SVG-diagonal, SVG-OM1/OM 2).   . Hyperlipidemia   . Ischemic cardiomyopathy     Echo 07/2009: Mild LVH, EF 40-45%, inferior and posterior HK, grade 1 diastolic dysfunction, moderate MAC, mild to moderate MR, mild LAE  . PAD (peripheral artery disease)   . Family history of anesthesia complication     Mom has a hard time to wake up  . Neuropathy     Hx; of B/L feet  . Type II diabetes mellitus   . Foot ulcer     "I've had them on both feet" (08/27/2012)  . Osteomyelitis of foot   . Sinus headache     "occasionally" (08/27/2012)  . Arthritis     "maybe in my lower back" (08/27/2012)  . Gouty arthritis     "right index finger" (08/27/2012)  . CKD (chronic kidney disease), stage I   . 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 Allergy   . NSTEMI (non-ST elevated myocardial infarction) 05/23/2009    Archie Endo 05/23/2009 (08/27/2012)    Past Surgical History  Procedure Laterality Date  . Cataract extraction w/ intraocular lens  implant, bilateral  2000's  . Finger surgery Right     "index finger; turned out to be gout" (08/27/2012)  . Coronary artery bypass graft  2011    "CABG X4" (08/27/2012)  . Dental surgery  04/30/11    "1 implant" (08/27/2012)  . Amputation ray Right 08/27/2012    5th ray/notes 08/27/2012  . Cardiac catheterization  05/24/2009    Archie Endo 05/24/2009 (08/27/2012)  . Amputation Right 08/27/2012    Procedure: AMPUTATION RAY;  Surgeon: Newt Minion, MD;  Location: [name];  Service: Orthopedics;  Laterality:  Right;  Right Foot 5th Ray Amputation  . Eye surgery    . Colonoscopy w/ biopsies and polypectomy  2010  . Breast lumpectomy  04/20/2013    with biopsy      DR WAKEFIELD  . Breast lumpectomy with needle localization and axillary sentinel lymph node bx Left 04/20/2013    Procedure: LEFT BREAST WIRE GUIDED LUMPECTOMY AND AXILLARY SENTINEL LYMPH NODE BX;  Surgeon: Rolm Bookbinder, MD;  Location: [place];  Service: General;  Laterality: Left;  . Dilation and curettage of uterus      Family History  Problem Relation Age of Onset  . Kidney cancer Brother 87  . Hypertension Brother   . Breast cancer Sister 79    LCIS; BRCA negative  . Throat cancer Father 52    smoker  . Breast cancer Maternal Aunt     dx in her 12s  . Breast cancer Sister 8    Lobular breast cancer   Social History:  reports that she has never smoked. She has never used smokeless tobacco. She reports that she does not drink alcohol or use illicit drugs.  Allergies:  Allergies  Allergen Reactions  . Atenolol Other (See Comments)    Exacerbates gout  . Contrast Media [Iodinated Diagnostic Agents] Rash  . Latex Rash  . Omnipaque [Iohexol]  Rash    No prescriptions prior to admission    No results found for this or any previous visit (from the past 48 hour(s)). No results found.  Review of Systems  All other systems reviewed and are negative.    There were no vitals taken for this visit. Physical Exam  On examination patient has palpable pulses. She has an ulcer which probes to bone. Radiograph shows destructive bony changes. Assessment/Plan Assessment: Osteomyelitis ulceration left foot second toe.  Plan: Will plan for left foot second ray amputation. Risks and benefits were discussed including infection neurovascular injury nonhealing of the wound need for additional surgery. Patient states she understands and wished to proceed at this time.  DUDA,MARCUS V 07/08/2013, 6:03 AM

## 2013-07-08 NOTE — Transfer of Care (Signed)
Immediate Anesthesia Transfer of Care Note  Patient: [name]  Procedure(s) Performed: Procedure(s) with comments: AMPUTATION RAY (Left) - Left Foot 2nd Ray Amputation  Patient Location: PACU  Anesthesia Type:General  Level of Consciousness: awake, alert , oriented and patient cooperative  Airway & Oxygen Therapy: Patient Spontanous Breathing and Patient connected to nasal cannula oxygen  Post-op Assessment: Report given to PACU RN, Post -op Vital signs reviewed and stable and Patient moving all extremities  Post vital signs: Reviewed and stable  Complications: No apparent anesthesia complications

## 2013-07-08 NOTE — Anesthesia Procedure Notes (Signed)
Procedure Name: LMA Insertion Date/Time: 07/08/2013 12:37 PM Performed by: Ned Grace Pre-anesthesia Checklist: Patient identified, Timeout performed, Emergency Drugs available, Suction available and Patient being monitored Patient Re-evaluated:Patient Re-evaluated prior to inductionOxygen Delivery Method: Circle system utilized Preoxygenation: Pre-oxygenation with 100% oxygen Intubation Type: IV induction Ventilation: Mask ventilation without difficulty LMA: LMA with gastric port inserted LMA Size: 4.0 Number of attempts: 1 Placement Confirmation: positive ETCO2 and breath sounds checked- equal and bilateral Tube secured with: Tape Dental Injury: Teeth and Oropharynx as per pre-operative assessment

## 2013-07-08 NOTE — Anesthesia Postprocedure Evaluation (Signed)
  Anesthesia Post-op Note  Patient: [name]  Procedure(s) Performed: Procedure(s) with comments: AMPUTATION RAY (Left) - Left Foot 2nd Ray Amputation  Patient Location: PACU  Anesthesia Type:General  Level of Consciousness: awake  Airway and Oxygen Therapy: Patient Spontanous Breathing  Post-op Pain: mild  Post-op Assessment: Post-op Vital signs reviewed  Post-op Vital Signs: Reviewed  Complications: No apparent anesthesia complications

## 2013-07-08 NOTE — Op Note (Signed)
OPERATIVE REPORT  DATE OF SURGERY: 07/08/2013  PATIENT:  [name],  68 y.o. female  PRE-OPERATIVE DIAGNOSIS:  Left Foot 2nd Toe Osteomyelitis  POST-OPERATIVE DIAGNOSIS:  Left Foot 2nd Toe Osteomyelitis  PROCEDURE:  Procedure(s): AMPUTATION RAY left foot second toe  SURGEON:  Surgeon(s): Newt Minion, MD  ANESTHESIA:   general  EBL:  min ML  SPECIMEN:  No Specimen  TOURNIQUET:  * No tourniquets in log *  PROCEDURE DETAILS: Patient is a 68 year old woman osteomyelitis left foot second toe with ulceration. She is exposed bone she has failed conservative care and presents at this time for second ray amputation. Risks and benefits were discussed including nonhealing of the wound. Patient states she understands was to proceed at this time. Description of procedure patient was brought to the operating room underwent a general anesthetic. After adequate levels of anesthesia were obtained patient's left lower extremity was prepped using DuraPrep and draped into a sterile field. A racquet incision was made around the second toe the second ray was resected through the shaft of the second metatarsal. The ulcer and toe were delivered as one block of tissue. The wound was irrigated with normal saline hemostasis was obtained. The incision was closed using 2-0 nylon. The wound is covered Adaptic orthopedic sponges AB dressing Kerlix and Coban. Patient was extubated taken to the PACU in stable condition.  PLAN OF CARE: Discharge to home after PACU  PATIENT DISPOSITION:  PACU - hemodynamically stable.   Newt Minion, MD 07/08/2013 12:48 PM

## 2013-07-08 NOTE — Anesthesia Preprocedure Evaluation (Signed)
Anesthesia Evaluation  Patient identified by MRN, date of birth, ID band Patient awake    Reviewed: Allergy & Precautions, H&P , NPO status , Patient's Chart, lab work & pertinent test results  Airway Mallampati: II      Dental   Pulmonary shortness of breath, asthma , COPD breath sounds clear to auscultation        Cardiovascular hypertension, + CAD, + Past MI, + Peripheral Vascular Disease and +CHF Rhythm:Regular Rate:Normal     Neuro/Psych  Headaches,    GI/Hepatic negative GI ROS, Neg liver ROS,   Endo/Other  diabetes  Renal/GU Renal disease     Musculoskeletal   Abdominal   Peds  Hematology   Anesthesia Other Findings   Reproductive/Obstetrics                           Anesthesia Physical Anesthesia Plan  ASA: III  Anesthesia Plan:    Post-op Pain Management:    Induction: Intravenous  Airway Management Planned: LMA  Additional Equipment:   Intra-op Plan:   Post-operative Plan: Extubation in OR  Informed Consent: I have reviewed the patients History and Physical, chart, labs and discussed the procedure including the risks, benefits and alternatives for the proposed anesthesia with the patient or authorized representative who has indicated his/her understanding and acceptance.   Dental advisory given  Plan Discussed with: CRNA and Anesthesiologist  Anesthesia Plan Comments:         Anesthesia Quick Evaluation

## 2013-07-11 ENCOUNTER — Encounter (HOSPITAL_COMMUNITY): Payer: Self-pay | Admitting: Orthopedic Surgery

## 2013-07-14 NOTE — Progress Notes (Signed)
  Radiation Oncology         [phone] ________________________________  Name: [name] MRN: [id number]  Date: 05/26/2013  DOB: 1945-05-31  Optical Surface Tracking Plan:  Since intensity modulated radiotherapy (IMRT) and 3D conformal radiation treatment methods are predicated on accurate and precise positioning for treatment, intrafraction motion monitoring is medically necessary to ensure accurate and safe treatment delivery.  The ability to quantify intrafraction motion without excessive ionizing radiation dose can only be performed with optical surface tracking. Accordingly, surface imaging offers the opportunity to obtain 3D measurements of patient position throughout IMRT and 3D treatments without excessive radiation exposure.  I am ordering optical surface tracking for this patient's upcoming course of radiotherapy. ________________________________  Marye Round, MD 07/14/2013 7:07 PM    Reference:   Particia Jasper, et al. Surface imaging-based analysis of intrafraction motion for breast radiotherapy patients.Journal of [place], n. 6, nov. 2014. ISSN [id number].   Available at: <http://www.jacmp.org/index.php/jacmp/article/view/4957>.

## 2013-07-14 NOTE — Progress Notes (Signed)
  Radiation Oncology         [phone] ________________________________  Name: [name] MRN: [id number]  Date: 07/06/2013  DOB: 1945/06/25  End of Treatment Note  Diagnosis:   Invasive ductal carcinoma of the left breast     Indication for treatment:  Curative       Radiation treatment dates:   06/09/2013 through 07/06/2013  Site/dose:   The patient was treated initially using whole breast cancer fields to the left breast. This delivered 42.5 gray in 17 fractions using a 3-D conformal technique. The patient then received a boost treatment using a three-field photon technique for an additional 7.5 gray. The patient's final dose was 50 gray.  Narrative: The patient tolerated radiation treatment relatively well.   Some erythema and hyperpigmentation were present at the end of treatment. No moist desquamation.  Plan: The patient has completed radiation treatment. The patient will return to radiation oncology clinic for routine followup in one month. I advised the patient to call or return sooner if they have any questions or concerns related to their recovery or treatment. ________________________________  Jodelle Gross, M.D., Ph.D.

## 2013-07-14 NOTE — Addendum Note (Signed)
Encounter addended by: Marye Round, MD on: 07/14/2013  7:07 PM<BR>     Documentation filed: Notes Section

## 2013-07-27 ENCOUNTER — Ambulatory Visit: Payer: Self-pay | Admitting: Internal Medicine

## 2013-07-28 DIAGNOSIS — Z853 Personal history of malignant neoplasm of breast: Secondary | ICD-10-CM | POA: Diagnosis not present

## 2013-07-28 DIAGNOSIS — Z780 Asymptomatic menopausal state: Secondary | ICD-10-CM | POA: Diagnosis not present

## 2013-08-05 ENCOUNTER — Encounter: Payer: Self-pay | Admitting: Radiation Oncology

## 2013-08-05 ENCOUNTER — Telehealth: Payer: Self-pay | Admitting: Oncology

## 2013-08-05 NOTE — Telephone Encounter (Signed)
per GM to move appt/Cld & spoke to pt to adv of new time & date-pt-will mail copy of sch

## 2013-08-09 ENCOUNTER — Other Ambulatory Visit: Payer: Self-pay | Admitting: *Deleted

## 2013-08-09 MED ORDER — INSULIN PEN NEEDLE 32G X 4 MM MISC: Status: DC

## 2013-08-10 ENCOUNTER — Encounter: Payer: Self-pay | Admitting: Radiation Oncology

## 2013-08-10 ENCOUNTER — Ambulatory Visit
Admission: RE | Admit: 2013-08-10 | Discharge: 2013-08-10 | Disposition: A | Discharge status: Home / Self Care | Payer: Medicare Other | Source: Ambulatory Visit | Attending: Radiation Oncology | Admitting: Radiation Oncology

## 2013-08-10 VITALS — BP 164/68 | HR 87 | Temp 97.6°F | Resp 20 | Ht 64.5 in | Wt 179.3 lb

## 2013-08-10 DIAGNOSIS — C50212 Malignant neoplasm of upper-inner quadrant of left female breast: Secondary | ICD-10-CM

## 2013-08-10 HISTORY — DX: Personal history of irradiation: Z92.3

## 2013-08-10 NOTE — Progress Notes (Signed)
Follow up s/p rad tx left breast well healed, some discoloration still, no pain there, bone density results yesterday  Said "normal range",  Hasn't started Arimidex as yet sees Dr. Jana Hakim  09/05/13, hasn't set up next mammogram 2:28 PM'

## 2013-08-10 NOTE — Progress Notes (Signed)
Radiation Oncology         [phone]) [phone] ________________________________  Name: [name] MRN: [id number]  Date: 08/10/2013  DOB: 21-Apr-1945  Follow-Up Visit Note  CC: Alesia Richards, MD  Rolm Bookbinder, MD  Diagnosis:   Left-sided breast cancer  Interval Since Last Radiation:  Approximately one month   Narrative:  The patient returns today for routine follow-up.  She has done well overall since she finished treatment. The patient's skin has healed significantly since she completed her course of radiation treatment. She has not begun anti-hormonal treatment.  The patient is scheduled to see medical oncology in several weeks.                            ALLERGIES:  is allergic to atenolol; contrast media; latex; and omnipaque.  Meds: Current Outpatient Prescriptions  Medication Sig Dispense Refill  . allopurinol (ZYLOPRIM) 100 MG tablet Take 100 mg by mouth 2 (two) times daily.      . Alpha-Lipoic Acid 200 MG CAPS Take 200 mg by mouth 2 (two) times daily.      Marland Kitchen aspirin 325 MG tablet Take 325 mg by mouth at bedtime.       . carvedilol (COREG) 12.5 MG tablet Take 1 tablet (12.5 mg total) by mouth 2 (two) times daily with a meal.  180 tablet  0  . cholecalciferol (VITAMIN D) 1000 UNITS tablet Take 1,000 Units by mouth daily.       . Coenzyme Q10 (CO Q-10) 100 MG CAPS Take 100 mg by mouth 2 (two) times daily.      Marland Kitchen COLCRYS 0.6 MG tablet Take 0.6 mg by mouth daily as needed (for gout flareups).       . doxycycline (VIBRA-TABS) 100 MG tablet Take 1 tablet (100 mg total) by mouth 2 (two) times daily.  28 tablet  0  . emollient (BIAFINE) cream Apply 1 application topically 2 (two) times daily.      . fish oil-omega-3 fatty acids 1000 MG capsule Take 1 g by mouth 2 (two) times daily.       Marland Kitchen glimepiride (AMARYL) 4 MG tablet Take 4 mg by mouth 2 (two) times daily.       Marland Kitchen glucose blood (PRODIGY TEST) test strip Use as instructed  100 each  4  . hyaluronate sodium (RADIAPLEXRX) GEL  Apply 1 application topically 2 (two) times daily. Apply after rad tx  And bedtime daily,not 4 hours prior to rad tx      . hydrochlorothiazide (HYDRODIURIL) 12.5 MG tablet Take 1 tablet (12.5 mg total) by mouth daily.  90 tablet  1  . insulin glargine (LANTUS) 100 UNIT/ML injection Inject 35 Units into the skin every morning.       . Insulin Pen Needle 32G X 4 MM MISC Check glucose 3 times a day  100 each  3  . Melatonin 5 MG TABS Take 5 mg by mouth at bedtime as needed (for sleep).      . metformin (FORTAMET) 1000 MG (OSM) 24 hr tablet Take 1,000 mg by mouth 2 (two) times daily with a meal.      . mupirocin cream (BACTROBAN) 2 % Apply topically 2 (two) times daily.  15 g  0  . non-metallic deodorant (ALRA) MISC Apply 1 application topically daily as needed.      Marland Kitchen OVER THE COUNTER MEDICATION 2 tablets 1 day or 1 dose. CinSulin (True Petra Kuba) Vitamin D,  Cinnamon bark and Chromium      . oxyCODONE (OXY IR/ROXICODONE) 5 MG immediate release tablet Take 1 tablet (5 mg total) by mouth every 4 (four) hours as needed for moderate pain.  30 tablet  0  . Polyvinyl Alcohol-Povidone (REFRESH OP) Place 1 drop into both eyes daily as needed (dry eyes).      . rosuvastatin (CRESTOR) 20 MG tablet Take 20 mg by mouth daily.      . valsartan (DIOVAN) 320 MG tablet Take 320 mg by mouth daily.      Marland Kitchen anastrozole (ARIMIDEX) 1 MG tablet Take 1 tablet (1 mg total) by mouth daily.  90 tablet  4  . aspirin-acetaminophen-caffeine (EXCEDRIN MIGRAINE) [id number] MG per tablet Take 1-2 tablets by mouth every 6 (six) hours as needed (sinus headache).       No current facility-administered medications for this encounter.    Physical Findings: The patient is in no acute distress. Patient is alert and oriented.  height is 5' 4.5" (1.638 m) and weight is 179 lb 4.8 oz (81.33 kg). Her oral temperature is 97.6 F (36.4 C). Her blood pressure is 164/68 and her pulse is 87. Her respiration is 20. .   The skin in the treatment  area has healed satisfactorily, no areas of concern/moist desquamation/poor healing.   Lab Findings: Lab Results  Component Value Date   WBC 7.8 07/08/2013   HGB 13.3 07/08/2013   HCT 40.3 07/08/2013   MCV 89.6 07/08/2013   PLT 172 07/08/2013     Radiographic Findings: No results found.  Impression:    The patient has done satisfactorily since finishing treatment. She has not begun anti-hormonal treatment.  Plan:  The patient will followup in our clinic on a when necessary basis.   Jodelle Gross, M.D., Ph.D.

## 2013-08-11 ENCOUNTER — Ambulatory Visit: Payer: Medicare Other | Admitting: Radiation Oncology

## 2013-08-18 ENCOUNTER — Other Ambulatory Visit: Payer: Medicare Other

## 2013-08-18 ENCOUNTER — Ambulatory Visit: Payer: Medicare Other | Admitting: Oncology

## 2013-08-18 ENCOUNTER — Other Ambulatory Visit: Payer: Self-pay | Admitting: Internal Medicine

## 2013-08-29 DIAGNOSIS — E119 Type 2 diabetes mellitus without complications: Secondary | ICD-10-CM

## 2013-08-29 DIAGNOSIS — M109 Gout, unspecified: Secondary | ICD-10-CM | POA: Insufficient documentation

## 2013-08-29 DIAGNOSIS — E559 Vitamin D deficiency, unspecified: Secondary | ICD-10-CM | POA: Insufficient documentation

## 2013-08-30 ENCOUNTER — Telehealth: Payer: Self-pay | Admitting: Oncology

## 2013-08-30 NOTE — Telephone Encounter (Signed)
moved 5/28 lb to 11am. s/w pt she is aware.

## 2013-08-31 ENCOUNTER — Ambulatory Visit: Payer: Self-pay | Admitting: Internal Medicine

## 2013-09-01 ENCOUNTER — Other Ambulatory Visit (HOSPITAL_BASED_OUTPATIENT_CLINIC_OR_DEPARTMENT_OTHER): Payer: Medicare Other

## 2013-09-01 DIAGNOSIS — C50212 Malignant neoplasm of upper-inner quadrant of left female breast: Secondary | ICD-10-CM

## 2013-09-01 DIAGNOSIS — M899 Disorder of bone, unspecified: Secondary | ICD-10-CM | POA: Diagnosis not present

## 2013-09-01 DIAGNOSIS — C50419 Malignant neoplasm of upper-outer quadrant of unspecified female breast: Secondary | ICD-10-CM

## 2013-09-01 DIAGNOSIS — C50919 Malignant neoplasm of unspecified site of unspecified female breast: Secondary | ICD-10-CM | POA: Diagnosis not present

## 2013-09-01 DIAGNOSIS — M8580 Other specified disorders of bone density and structure, unspecified site: Secondary | ICD-10-CM

## 2013-09-01 LAB — COMPREHENSIVE METABOLIC PANEL (CC13)
ALBUMIN: 3.5 g/dL (ref 3.5–5.0)
ALT: 25 U/L (ref 0–55)
ANION GAP: 12 meq/L — AB (ref 3–11)
AST: 17 U/L (ref 5–34)
Alkaline Phosphatase: 96 U/L (ref 40–150)
BUN: 44.5 mg/dL — ABNORMAL HIGH (ref 7.0–26.0)
CALCIUM: 9.9 mg/dL (ref 8.4–10.4)
CO2: 23 meq/L (ref 22–29)
Chloride: 102 mEq/L (ref 98–109)
Creatinine: 1.7 mg/dL — ABNORMAL HIGH (ref 0.6–1.1)
Glucose: 375 mg/dl — ABNORMAL HIGH (ref 70–140)
Potassium: 5.1 mEq/L (ref 3.5–5.1)
SODIUM: 137 meq/L (ref 136–145)
TOTAL PROTEIN: 6.8 g/dL (ref 6.4–8.3)
Total Bilirubin: 0.37 mg/dL (ref 0.20–1.20)

## 2013-09-01 LAB — CBC WITH DIFFERENTIAL/PLATELET
BASO%: 0.2 % (ref 0.0–2.0)
Basophils Absolute: 0 10*3/uL (ref 0.0–0.1)
EOS ABS: 0.2 10*3/uL (ref 0.0–0.5)
EOS%: 3.2 % (ref 0.0–7.0)
HCT: 39.4 % (ref 34.8–46.6)
HEMOGLOBIN: 12.9 g/dL (ref 11.6–15.9)
LYMPH%: 25.3 % (ref 14.0–49.7)
MCH: 28.8 pg (ref 25.1–34.0)
MCHC: 32.7 g/dL (ref 31.5–36.0)
MCV: 87.9 fL (ref 79.5–101.0)
MONO#: 0.5 10*3/uL (ref 0.1–0.9)
MONO%: 7.7 % (ref 0.0–14.0)
NEUT%: 63.6 % (ref 38.4–76.8)
NEUTROS ABS: 4.2 10*3/uL (ref 1.5–6.5)
Platelets: 210 10*3/uL (ref 145–400)
RBC: 4.48 10*6/uL (ref 3.70–5.45)
RDW: 14.4 % (ref 11.2–14.5)
WBC: 6.6 10*3/uL (ref 3.9–10.3)
lymph#: 1.7 10*3/uL (ref 0.9–3.3)

## 2013-09-02 LAB — VITAMIN D 25 HYDROXY (VIT D DEFICIENCY, FRACTURES): Vit D, 25-Hydroxy: 58 ng/mL (ref 30–89)

## 2013-09-05 ENCOUNTER — Telehealth: Payer: Self-pay | Admitting: Internal Medicine

## 2013-09-05 ENCOUNTER — Ambulatory Visit (HOSPITAL_BASED_OUTPATIENT_CLINIC_OR_DEPARTMENT_OTHER): Payer: Medicare Other | Admitting: Internal Medicine

## 2013-09-05 VITALS — BP 141/75 | HR 99 | Temp 97.3°F | Resp 18 | Ht 64.0 in | Wt 177.6 lb

## 2013-09-05 DIAGNOSIS — Z803 Family history of malignant neoplasm of breast: Secondary | ICD-10-CM | POA: Diagnosis not present

## 2013-09-05 DIAGNOSIS — C50419 Malignant neoplasm of upper-outer quadrant of unspecified female breast: Secondary | ICD-10-CM

## 2013-09-05 DIAGNOSIS — Z170 Estrogen receptor positive status [ER+]: Secondary | ICD-10-CM | POA: Diagnosis not present

## 2013-09-05 DIAGNOSIS — C50212 Malignant neoplasm of upper-inner quadrant of left female breast: Secondary | ICD-10-CM

## 2013-09-05 DIAGNOSIS — Z8051 Family history of malignant neoplasm of kidney: Secondary | ICD-10-CM | POA: Diagnosis not present

## 2013-09-05 NOTE — Telephone Encounter (Signed)
, °

## 2013-09-05 NOTE — Progress Notes (Signed)
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 There was a 3 mm nodule in the left breast which was further evaluated November 4, confirming an irregular mass at the 1:00 position of the left breast, not confirmed by ultrasonography. Biopsy of this mass 02/10/2013 showed an invasive ductal carcinoma, grade 1, with a prognostic panel pending. Breast MRI has been scheduled for 02/24/2013  She had lumpectomy and sentinel lymph node sampling 04/20/2013. The final pathology ([id number]) showed a 0.15 cm invasive ductal carcinoma, grade 1, with negative margins, extensive intraductal component, and both sentinel lymph nodes clear. Tumor is strongly ER+ and PR+, Her2-neu negative.  INTERVAL HISTORY: She was prescribed an aromatase inhibitor month ago, but she has yet to start it. She is questions with regard to the side effects and benefits that she would have with an aromatase inhibitor. She currently has no complaints of any bony aches or pain. She has been eating well. She has no complaints of any bony aches or pains. She had a recent bone density scan which was normal. She takes herbal over-the-counter medications such as alpha-lipoic acid, coenzyme Q10, and cinnamon tablets. She is concerned about taking additional medications.  Her previous constipation related to Percocet after surgery is less of an issue. She uses over-the-counter laxatives on  a when necessary basis.  ROS:  Negative for respiratory, negative for skin, negative for neurology, negative for cardiovascular, negative for gastroenterology [see history of present illness], positive for endocrine with diabetes, negative for ID, negative for genitourinary, negative for mental health, negative for constitutional symptoms, negative for musculoskeletal, negative for HEENT  PAST MEDICAL HISTORY: Past Medical History  Diagnosis Date  . Hypertension   . Coronary artery disease     NSTEMI in 05/2009;  LHC with 2 v CAD - > CABG (LIMA-LAD, SVG-diagonal, SVG-OM1/OM 2).   . Hyperlipidemia   . Ischemic cardiomyopathy     Echo 07/2009: Mild LVH, EF 40-45%, inferior and posterior HK, grade 1 diastolic dysfunction, moderate MAC, mild to moderate MR, mild LAE  . PAD (peripheral artery disease)   . Family history of anesthesia complication     Mom has a hard time to wake up  . Neuropathy     Hx; of B/L feet  . Type II diabetes mellitus   . Foot ulcer     "I've had them on both feet" (08/27/2012)  . Osteomyelitis of foot   . Sinus headache     "occasionally" (08/27/2012)  . CKD (chronic kidney disease), stage I   . 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 Allergy   . NSTEMI (non-ST elevated myocardial infarction) 05/23/2009    Archie Endo 05/23/2009 (08/27/2012)  . History of radiation therapy 06/09/13-07/06/13    left breast 50Gy  . Type II or unspecified type diabetes mellitus without mention of complication, not stated as uncontrolled   . Arthritis     "maybe in my lower back" (08/27/2012)  . Gouty arthritis     "  right index finger" (08/27/2012)  . Vitamin D deficiency     PAST SURGICAL HISTORY: Past Surgical History  Procedure Laterality Date  . Cataract extraction w/ intraocular lens  implant, bilateral  2000's  . Finger surgery Right     "index finger; turned out to be gout" (08/27/2012)  . Coronary artery bypass graft  2011    "CABG X4"  (08/27/2012)  . Dental surgery  04/30/11    "1 implant" (08/27/2012)  . Amputation ray Right 08/27/2012    5th ray/notes 08/27/2012  . Cardiac catheterization  05/24/2009    Archie Endo 05/24/2009 (08/27/2012)  . Amputation Right 08/27/2012    Procedure: AMPUTATION RAY;  Surgeon: Newt Minion, MD;  Location: [place];  Service: Orthopedics;  Laterality: Right;  Right Foot 5th Ray Amputation  . Eye surgery    . Colonoscopy w/ biopsies and polypectomy  2010  . Breast lumpectomy  04/20/2013    with biopsy      DR WAKEFIELD  . Breast lumpectomy with needle localization and axillary sentinel lymph node bx Left 04/20/2013    Procedure: LEFT BREAST WIRE GUIDED LUMPECTOMY AND AXILLARY SENTINEL LYMPH NODE BX;  Surgeon: Rolm Bookbinder, MD;  Location: [place];  Service: General;  Laterality: Left;  . Dilation and curettage of uterus    . Amputation Left 07/08/2013    Procedure: AMPUTATION RAY;  Surgeon: Newt Minion, MD;  Location: [place];  Service: Orthopedics;  Laterality: Left;  Left Foot 2nd Ray Amputation    FAMILY HISTORY Family History  Problem Relation Age of Onset  . Kidney cancer Brother 63  . Hypertension Brother   . Breast cancer Sister 9    LCIS; BRCA negative  . Throat cancer Father 1    smoker  . Breast cancer Maternal Aunt     dx in her 85s  . Breast cancer Sister 77    Lobular breast cancer   patient's father died at the age of 51 from throat cancer. The patient's mother died at the age of 68 from "many medical problems". The patient had 4 brothers, 3 sisters. One brother had kidney cancer diagnosed at age 70. One sister was diagnosed with breast cancer at age 60 and died Oct 20, 2012. A second sister was diagnosed with breast cancer at age 68. There is also one of the patient's mother's 5 sisters diagnosed with breast cancer remotely.  GYNECOLOGIC HISTORY:  Menarche age 53 am first live birth age 39, the patient is Bre[employer] P73. (She had a twin pregnancy, [name] and [name], delivered prematurely,  with the babies dying after a half day). She underwent menopause in her late 76s and used hormone replacement for a few months.  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 Their both now retired. Son [name] lives in [place] where he works as a Proofreader. The patient has 3 grandchildren. She attends first [employer] in [place]: In place   HEALTH MAINTENANCE: History  Substance Use Topics  . Smoking status: Never Smoker   . Smokeless tobacco: Never Used  . Alcohol Use: No     Colonoscopy: 2010  PAP: 2010  Bone density: 10/21/2007 at [facility], normal  Lipid panel:  Allergies  Allergen Reactions  . Atenolol Other (See Comments)    Exacerbates gout  . Contrast Media [Iodinated Diagnostic Agents] Rash  . Latex Rash  . Omnipaque [Iohexol] Rash    Current  Outpatient Prescriptions  Medication Sig Dispense Refill  . allopurinol (ZYLOPRIM) 100 MG tablet TAKE 1 TABLET TWICE DAILY  180 tablet  0  . Alpha-Lipoic Acid 200 MG CAPS Take 200 mg by mouth 2 (two) times daily.      Marland Kitchen aspirin 325 MG tablet Take 325 mg by mouth at bedtime.       Marland Kitchen aspirin-acetaminophen-caffeine (EXCEDRIN MIGRAINE) 250-250-65 MG per tablet Take 1-2 tablets by mouth every 6 (six) hours as needed (sinus headache).      . carvedilol (COREG) 12.5 MG tablet Take 1 tablet (12.5 mg total) by mouth 2 (two) times daily with a meal.  180 tablet  0  . Coenzyme Q10 (CO Q-10) 100 MG CAPS Take 100 mg by mouth 2 (two) times daily.      Marland Kitchen COLCRYS 0.6 MG tablet Take 0.6 mg by mouth daily as needed (for gout flareups).       . fish oil-omega-3 fatty acids 1000 MG capsule Take 1 g by mouth 2 (two) times daily.       Marland Kitchen glimepiride (AMARYL) 4 MG tablet Take 4 mg by mouth 2 (two) times daily.       Marland Kitchen glucose blood (PRODIGY TEST) test strip Use as instructed  100 each  4  . hyaluronate sodium (RADIAPLEXRX) GEL Apply 1 application topically 2  (two) times daily. Apply after rad tx  And bedtime daily,not 4 hours prior to rad tx      . hydrochlorothiazide (HYDRODIURIL) 12.5 MG tablet Take 1 tablet (12.5 mg total) by mouth daily.  90 tablet  1  . insulin glargine (LANTUS) 100 UNIT/ML injection Inject 35 Units into the skin every morning.       . Insulin Pen Needle 32G X 4 MM MISC Check glucose 3 times a day  100 each  3  . Melatonin 5 MG TABS Take 5 mg by mouth at bedtime as needed (for sleep).      Marland Kitchen OVER THE COUNTER MEDICATION 2 tablets 1 day or 1 dose. CinSulin (True Nature) Vitamin D, Cinnamon bark and Chromium      . oxyCODONE (OXY IR/ROXICODONE) 5 MG immediate release tablet Take 1 tablet (5 mg total) by mouth every 4 (four) hours as needed for moderate pain.  30 tablet  0  . Polyvinyl Alcohol-Povidone (REFRESH OP) Place 1 drop into both eyes daily as needed (dry eyes).      . rosuvastatin (CRESTOR) 20 MG tablet Take 20 mg by mouth daily.      . valsartan (DIOVAN) 320 MG tablet Take 320 mg by mouth daily.      Marland Kitchen anastrozole (ARIMIDEX) 1 MG tablet Take 1 tablet (1 mg total) by mouth daily.  90 tablet  4  . metformin (FORTAMET) 1000 MG (OSM) 24 hr tablet Take 1,000 mg by mouth 2 (two) times daily with a meal.       No current facility-administered medications for this visit.    PHYSICAL EXAMINATION:  Filed Vitals:   09/05/13 1423  BP: 141/75  Pulse: 99  Temp: 97.3 F (36.3 C)  Resp: 18     Body mass index is 30.47 kg/(m^2).    ECOG FS:1 - Symptomatic but completely ambulatory General: well-nourished in no acute distress. Alert and oriented X 3 HEENT: PERRLA. Anicteric sclerae.  Mucous membranes moist Lymph nodes: no adenopathy appreciated within cervical, supraclavicular, infraclavicular or axillary regions.  Lungs: Clear to ausculatation throughout. No rales or wheezing. Cardiovascular: regular, rate, rhythm. S1, S2. No  jugular vein distension. Positive peripheral pulses. Breast:  no palpable suspicious masses in both  breasts. Lumpectomy scar well-healed. Abdomen:  Positive bowel sounds. No hepatospenomegaly. Non-tender. Non-distended Groin: No adenopathy Extremities: No swelling bilaterally Cranial nerves: no gross focal deficits Skin: unremarkable  LAB RESULTS  Lab Results  Component Value Date   WBC 6.6 09/01/2013   NEUTROABS 4.2 09/01/2013   HGB 12.9 09/01/2013   HCT 39.4 09/01/2013   MCV 87.9 09/01/2013   PLT 210 09/01/2013      Chemistry      Component Value Date/Time   NA 137 09/01/2013 1100   NA 142 07/08/2013 0712   K 5.1 09/01/2013 1100   K 4.4 07/08/2013 0712   CL 105 07/08/2013 0712   CO2 23 09/01/2013 1100   CO2 23 07/08/2013 0712   BUN 44.5* 09/01/2013 1100   BUN 42* 07/08/2013 0712   CREATININE 1.7* 09/01/2013 1100   CREATININE 1.55* 07/08/2013 0712      Component Value Date/Time   CALCIUM 9.9 09/01/2013 1100   CALCIUM 10.2 07/08/2013 0712   ALKPHOS 96 09/01/2013 1100   ALKPHOS 69 07/08/2013 0712   AST 17 09/01/2013 1100   AST 22 07/08/2013 0712   ALT 25 09/01/2013 1100   ALT 23 07/08/2013 0712   BILITOT 0.37 09/01/2013 1100   BILITOT 0.3 07/08/2013 6384       STUDIES: Dg Chest 2 View  04/12/2013   CLINICAL DATA:  Preoperative for breast surgery, history of CABG  EXAM: CHEST  2 VIEW  COMPARISON:  June 19, 2009  FINDINGS: The lungs are adequately inflated. The costophrenic angles are sharp today. There is no alveolar edema or infiltrate. The interstitial markings are minimally prominent though stable. The cardiac silhouette is normal in size. There is calcification of the mitral valvular annulus. The patient has undergone previous median sternotomy and CABG. There is mild tortuosity of the descending thoracic aorta. The observed portions of the bony thorax appear normal.  IMPRESSION: There is no evidence of CHF nor pneumonia nor other acute cardiopulmonary abnormality.   Electronically Signed   By: David  Martinique   On: 04/12/2013 14:33   Nm Sentinel Node Inj-no Rpt (breast)  04/20/2013   CLINICAL  DATA: left breast wire guided biopsy   Sulfur colloid was injected intradermally by the nuclear medicine  technologist for breast cancer sentinel node localization.    Mm Lt Plc Breast Loc Dev   1st Lesion  Inc Mammo Guide  04/20/2013   CLINICAL DATA:  Patient with the left breast invasive ductal carcinoma  EXAM: NEEDLE LOCALIZATION OF THE LEFT BREAST WITH MAMMO GUIDANCE  COMPARISON:  Previous exams.  FINDINGS: Patient presents for needle localization prior to lumpectomy. I met with the patient and we discussed the procedure of needle localization including benefits and alternatives. We discussed the high likelihood of a successful procedure. We discussed the risks of the procedure, including infection, bleeding, tissue injury, and further surgery. Informed, written consent was given. The usual time-out protocol was performed immediately prior to the procedure.  Using mammographic guidance, sterile technique, 2% lidocaine and a 9 cm modified Kopans needle, the spiculated mass and biopsy marking clip were localized using a lateral approach. The films were marked for Dr. Donne Hazel.  Specimen radiograph was performed at day surgery and confirms spiculated mass and a biopsy marking clip present in the tissue sample. The specimen was marked for pathology.  IMPRESSION: Needle localization mass and biopsy marking clip within the left breast. No apparent  complications.   Electronically Signed   By: Lovey Newcomer M.D.   On: 04/20/2013 12:17    IMPRESSION/REPORT/PLAN:   68 y.o. 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 She already has a prescription filled. Side effects of an aromatase inhibitor including but not  limited to arthralgias myalgias, hot flashes, and risk for bone thinning discussed. We reviewed her bone density scan in great detail. She agrees to going ahead with a risk reducing oral aromatase inhibitor.   We shall plan on her coming back to see [name] in 3-4 weeks to discuss her tolerability of the AI. Plan for mammogram in November of this year.  2) BRCA negative She has a strong family history of breast cancer and younger sister diagnosed at age 53 and died this year at age 39. She has a younger brother who was diagnosed with kidney cancer died in 06-14-22 of this year. BRCA testing negative.  Multiple questions answered  Total time 35 minutes with more to 50% spent in counseling.   Doristine Church, MD   09/05/2013 3:36 PM

## 2013-09-06 ENCOUNTER — Other Ambulatory Visit: Payer: Self-pay | Admitting: Physician Assistant

## 2013-09-09 NOTE — Addendum Note (Signed)
Addended by: Doristine Church on: 09/09/2013 12:04 PM   Modules accepted: Orders

## 2013-09-27 ENCOUNTER — Ambulatory Visit (HOSPITAL_BASED_OUTPATIENT_CLINIC_OR_DEPARTMENT_OTHER): Payer: Medicare Other | Admitting: Hematology and Oncology

## 2013-09-27 ENCOUNTER — Telehealth: Payer: Self-pay | Admitting: Hematology and Oncology

## 2013-09-27 VITALS — BP 155/70 | HR 87 | Temp 98.0°F | Resp 20 | Ht 64.0 in | Wt 179.7 lb

## 2013-09-27 DIAGNOSIS — C50212 Malignant neoplasm of upper-inner quadrant of left female breast: Secondary | ICD-10-CM

## 2013-09-27 DIAGNOSIS — C50419 Malignant neoplasm of upper-outer quadrant of unspecified female breast: Secondary | ICD-10-CM

## 2013-09-27 DIAGNOSIS — Z170 Estrogen receptor positive status [ER+]: Secondary | ICD-10-CM | POA: Diagnosis not present

## 2013-09-27 DIAGNOSIS — Z8051 Family history of malignant neoplasm of kidney: Secondary | ICD-10-CM | POA: Diagnosis not present

## 2013-09-27 DIAGNOSIS — Z803 Family history of malignant neoplasm of breast: Secondary | ICD-10-CM | POA: Diagnosis not present

## 2013-09-27 NOTE — Telephone Encounter (Signed)
pep of to sch appt-gave pt copy of sch

## 2013-10-01 ENCOUNTER — Other Ambulatory Visit: Payer: Self-pay | Admitting: Hematology and Oncology

## 2013-10-01 NOTE — Progress Notes (Signed)
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 There was a 3 mm nodule in the left breast which was further evaluated November 4, confirming an irregular mass at the 1:00 position of the left breast, not confirmed by ultrasonography. Biopsy of this mass 02/10/2013 showed an invasive ductal carcinoma, grade 1, with a prognostic panel pending. Breast MRI has been scheduled for 02/24/2013  She had lumpectomy and sentinel lymph node sampling 04/20/2013. The final pathology ([id number]) showed a 0.15 cm invasive ductal carcinoma, grade 1, with negative margins, extensive intraductal component, and both sentinel lymph nodes clear. Tumor is strongly ER+ and PR+, Her2-neu negative.  INTERVAL HISTORY: [name] is a 68 years old pleasant female is here for followup visit for her  breast cancer. Since the time of last visit  she was started on Arimidex  on 09/05/2013. She says that she's been tolerating the Arimidex quite well with minimal side effects of myalgias and also back pain. She occasionally complains of nausea and bloating. Her bone density scan was done in April 2015 arm that shows normal. She had bilateral digital mammogram on 09/05/2013. Results were not  displaying in the system. She says she's maintaining her weight. She denies any decrease in appetite,   shortness of breath, chest pain, palpitations, blood in the stool or blood in the urine. Denies any dizziness, headaches or  blurred vision  ROS: A 10 point detailed review of systems is been assessed and pertinent symptoms as mentioned in interval history    PAST MEDICAL HISTORY: Past Medical History  Diagnosis Date  . Hypertension   . Coronary artery disease     NSTEMI in 05/2009;  LHC with 2 v CAD - > CABG (LIMA-LAD, SVG-diagonal, SVG-OM1/OM 2).   . Hyperlipidemia   . Ischemic cardiomyopathy     Echo 07/2009: Mild LVH, EF 40-45%, inferior and posterior HK, grade 1 diastolic dysfunction, moderate MAC, mild to moderate MR, mild LAE  . PAD (peripheral artery disease)   . Family history of anesthesia complication     Mom has a hard time to wake up  . Neuropathy     Hx; of B/L feet  . Type II diabetes mellitus   . Foot ulcer     "I've had them on both feet" (08/27/2012)  . Osteomyelitis of foot   . Sinus headache     "occasionally" (08/27/2012)  . CKD (chronic kidney disease), stage I   . 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 Allergy   . NSTEMI (non-ST elevated myocardial infarction) 05/23/2009    [name] 05/23/2009 (08/27/2012)  . History of radiation therapy 06/09/13-07/06/13    left breast 50Gy  . Type II or unspecified type diabetes mellitus without mention of complication, not stated as uncontrolled   . Arthritis     "maybe in my lower back" (08/27/2012)  . Gouty arthritis     "right index finger" (08/27/2012)  . Vitamin D deficiency     PAST SURGICAL HISTORY: Past  Surgical History  Procedure Laterality Date  . Cataract extraction w/ intraocular lens  implant, bilateral  2000's  . Finger surgery Right     "index finger; turned out to be gout" (08/27/2012)  . Coronary artery bypass graft  2011    "CABG X4" (08/27/2012)  . Dental surgery  04/30/11    "1 implant" (08/27/2012)  . Amputation ray Right 08/27/2012    5th ray/notes 08/27/2012  . Cardiac catheterization  05/24/2009    [name] 05/24/2009 (08/27/2012)  . Amputation Right 08/27/2012    Procedure:  AMPUTATION RAY;  Surgeon: Pamela Minion, MD;  Location: [facility];  Service: Orthopedics;  Laterality: Right;  Right Foot 5th Ray Amputation  . Eye surgery    . Colonoscopy w/ biopsies and polypectomy  2010  . Breast lumpectomy  04/20/2013    with biopsy      [name]  . Breast lumpectomy with needle localization and axillary sentinel lymph node bx Left 04/20/2013    Procedure: LEFT BREAST WIRE GUIDED LUMPECTOMY AND AXILLARY SENTINEL LYMPH NODE BX;  Surgeon: Pamela Bookbinder, MD;  Location: [facility];  Service: General;  Laterality: Left;  . Dilation and curettage of uterus    . Amputation Left 07/08/2013    Procedure: AMPUTATION RAY;  Surgeon: Pamela Minion, MD;  Location: [place];  Service: Orthopedics;  Laterality: Left;  Left Foot 2nd Ray Amputation    FAMILY HISTORY Family History  Problem Relation Age of Onset  . Kidney cancer Brother 33  . Hypertension Brother   . Breast cancer Sister 42    LCIS; BRCA negative  . Throat cancer Father 9    smoker  . Breast cancer Maternal Aunt     dx in her 54s  . Breast cancer Sister 52    Lobular breast cancer   patient's father died at the age of 35 from throat cancer. The patient's mother died at the age of 24 from "many medical problems". The patient had 4 brothers, 3 sisters. One brother had kidney cancer diagnosed at age 70. One sister was diagnosed with breast cancer at age 63 and died Oct 30, 2012. A second sister was diagnosed with breast cancer at age 48. There is also one of the patient's mother's 5 sisters diagnosed with breast cancer remotely.  GYNECOLOGIC HISTORY:  Menarche age 65 am first live birth age 3, the patient is [name]. (She had a twin pregnancy, [name], delivered prematurely, with the babies dying after a half day). She underwent menopause in her late 34s and used hormone replacement for a few months.  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 Son  [name] where he works as a Proofreader. The patient has 3 grandchildren. She attends [facility] [employer] in [place]: In place   HEALTH MAINTENANCE: History  Substance Use Topics  . Smoking status: Never Smoker   . Smokeless tobacco: Never Used  . Alcohol Use: No     Colonoscopy: 2010  PAP: 2010  Bone density: 10/21/2007 at [facility], normal  Lipid panel:  Allergies  Allergen Reactions  . Atenolol Other (See Comments)    Exacerbates gout  . Contrast Media [Iodinated Diagnostic Agents] Rash  . Latex Rash  . Omnipaque [Iohexol] Rash    Current Outpatient Prescriptions  Medication Sig Dispense Refill  . allopurinol (ZYLOPRIM) 100 MG tablet TAKE 1 TABLET  TWICE DAILY  180 tablet  0  . Alpha-Lipoic Acid 200 MG CAPS Take 200 mg by mouth 2 (two) times daily.      Marland Kitchen anastrozole (ARIMIDEX) 1 MG tablet Take 1 tablet (1 mg total) by mouth daily.  90 tablet  4  . aspirin 325 MG tablet Take 325 mg by mouth at bedtime.       Marland Kitchen aspirin-acetaminophen-caffeine (EXCEDRIN MIGRAINE) 250-250-65 MG per tablet Take 1-2 tablets by mouth every 6 (six) hours as needed (sinus headache).      . carvedilol (COREG) 12.5 MG tablet Take 1 tablet (12.5 mg total) by mouth 2 (two) times daily with a meal.  180 tablet  0  . Coenzyme Q10 (CO Q-10) 100 MG CAPS Take 100 mg by mouth 2 (two) times daily.      Marland Kitchen glimepiride (AMARYL) 4 MG tablet TAKE 1 TABLET TWICE DAILY  180 tablet  0  . glucose blood (PRODIGY TEST) test strip Use as instructed  100 each  4  . hydrochlorothiazide (HYDRODIURIL) 12.5 MG tablet Take 1 tablet (12.5 mg total) by mouth daily.  90 tablet  1  . insulin glargine (LANTUS) 100 UNIT/ML injection Inject 35 Units into the skin every morning.       . Insulin Pen Needle 32G X 4 MM MISC Check glucose 3 times a day  100 each  3  . Melatonin 5 MG TABS Take 5 mg by mouth at bedtime as needed (for sleep).      . metformin (FORTAMET) 1000 MG (OSM)  24 hr tablet Take 1,000 mg by mouth 2 (two) times daily with a meal.      . OVER THE COUNTER MEDICATION 2 tablets 1 day or 1 dose. CinSulin (True Nature) Vitamin D, Cinnamon bark and Chromium      . oxyCODONE (OXY IR/ROXICODONE) 5 MG immediate release tablet Take 1 tablet (5 mg total) by mouth every 4 (four) hours as needed for moderate pain.  30 tablet  0  . Polyvinyl Alcohol-Povidone (REFRESH OP) Place 1 drop into both eyes daily as needed (dry eyes).      . rosuvastatin (CRESTOR) 20 MG tablet Take 20 mg by mouth daily.      . valsartan (DIOVAN) 320 MG tablet Take 320 mg by mouth daily.      Marland Kitchen COLCRYS 0.6 MG tablet Take 0.6 mg by mouth daily as needed (for gout flareups).       . hyaluronate sodium (RADIAPLEXRX) GEL Apply 1 application topically 2 (two) times daily. Apply after rad tx  And bedtime daily,not 4 hours prior to rad tx       No current facility-administered medications for this visit.    PHYSICAL EXAMINATION:  Filed Vitals:   09/27/13 1131  BP: 155/70  Pulse: 87  Temp: 98 F (36.7 C)  Resp: 20     Body mass index is 30.83 kg/(m^2).    ECOG FS:1 - Symptomatic but completely ambulatory General: well-nourished in no acute distress. Alert and oriented X 3 HEENT: PERRLA. Anicteric sclerae.  Mucous membranes moist Lymph nodes: no adenopathy appreciated within cervical, supraclavicular, infraclavicular or axillary regions.  Lungs: Clear to ausculatation throughout. No rales or wheezing. Cardiovascular: regular, rate, rhythm. S1, S2. No jugular vein distension. Positive peripheral pulses. Breast:  no palpable suspicious masses in both breasts. Lumpectomy scar well-healed. No bilateral axillary lymphadenopathy Abdomen:  Positive bowel sounds. No hepatospenomegaly. Non-tender. Non-distended Groin: No adenopathy Extremities: No swelling bilaterally Cranial nerves: no gross focal deficits  Skin: unremarkable  LAB RESULTS  Lab Results  Component Value Date   WBC 6.6 09/01/2013    NEUTROABS 4.2 09/01/2013   HGB 12.9 09/01/2013   HCT 39.4 09/01/2013   MCV 87.9 09/01/2013   PLT 210 09/01/2013      Chemistry      Component Value Date/Time   NA 137 09/01/2013 1100   NA 142 07/08/2013 0712   K 5.1 09/01/2013 1100   K 4.4 07/08/2013 0712   CL 105 07/08/2013 0712   CO2 23 09/01/2013 1100   CO2 23 07/08/2013 0712   BUN 44.5* 09/01/2013 1100   BUN 42* 07/08/2013 0712   CREATININE 1.7* 09/01/2013 1100   CREATININE 1.55* 07/08/2013 0712      Component Value Date/Time   CALCIUM 9.9 09/01/2013 1100   CALCIUM 10.2 07/08/2013 0712   ALKPHOS 96 09/01/2013 1100   ALKPHOS 69 07/08/2013 0712   AST 17 09/01/2013 1100   AST 22 07/08/2013 0712   ALT 25 09/01/2013 1100   ALT 23 07/08/2013 0712   BILITOT 0.37 09/01/2013 1100   BILITOT 0.3 07/08/2013 3748       STUDIES: Dg Chest 2 View  04/12/2013   CLINICAL DATA:  Preoperative for breast surgery, history of CABG  EXAM: CHEST  2 VIEW  COMPARISON:  June 19, 2009  FINDINGS: The lungs are adequately inflated. The costophrenic angles are sharp today. There is no alveolar edema or infiltrate. The interstitial markings are minimally prominent though stable. The cardiac silhouette is normal in size. There is calcification of the mitral valvular annulus. The patient has undergone previous median sternotomy and CABG. There is mild tortuosity of the descending thoracic aorta. The observed portions of the bony thorax appear normal.  IMPRESSION: There is no evidence of CHF nor pneumonia nor other acute cardiopulmonary abnormality.   Electronically Signed   By: David  Martinique   On: 04/12/2013 14:33   Nm Sentinel Node Inj-no Rpt (breast)  04/20/2013   CLINICAL DATA: left breast wire guided biopsy   Sulfur colloid was injected intradermally by the nuclear medicine  technologist for breast cancer sentinel node localization.    Mm Lt Plc Breast Loc Dev   1st Lesion  Inc Mammo Guide  04/20/2013   CLINICAL DATA:  Patient with the left breast invasive ductal carcinoma  EXAM:  NEEDLE LOCALIZATION OF THE LEFT BREAST WITH MAMMO GUIDANCE  COMPARISON:  Previous exams.  FINDINGS: Patient presents for needle localization prior to lumpectomy. I met with the patient and we discussed the procedure of needle localization including benefits and alternatives. We discussed the high likelihood of a successful procedure. We discussed the risks of the procedure, including infection, bleeding, tissue injury, and further surgery. Informed, written consent was given. The usual time-out protocol was performed immediately prior to the procedure.  Using mammographic guidance, sterile technique, 2% lidocaine and a 9 cm modified Kopans needle, the spiculated mass and biopsy marking clip were localized using a lateral approach. The films were marked for Pamela. Donne Hazel.  Specimen radiograph was performed at day surgery and confirms spiculated mass and a biopsy marking clip present in the tissue sample. The specimen was marked for pathology.  IMPRESSION: Needle localization mass and biopsy marking clip within the left breast. No apparent complications.   Electronically Signed   By: Lovey Newcomer M.D.   On: 04/20/2013 12:17    IMPRESSION/REPORT/PLAN:   68 y.o. BRCA negative [place] woman with 1) Early Stage IA T1a N0 M0 Left breast grade  1 invasive ductal carcinoma ER+, PR+, Her2-neu negative with low grade DCIS, status post lumpectomy January 2015, status post adjuvant radiation recently completed 08/2013: Patient was started on Arimidex on 09/05/2013 and she's been tolerating quite well with minimal side effects of myalgias. Continue Arimidex 1 mg by mouth once daily. There is no evidence of disease recurrence clinically. Since there is documentation of mammogram performed in June 2015 will obtain the mammogram report. Her annual digital diagnostic mammogram is scheduled in November 2015.  2) BRCA negative She has a strong family history of breast cancer and younger sister diagnosed at age 37 and died  this year at age 48. She has a younger brother who was diagnosed with kidney cancer died in 05/30/22 of this year.   3) Bone density scan performed in April 2015 was normal by WHO criteria:  I have asked the patient to start taking calcium with vitamin D supplementation   Next followup visit in 3 months with CBC differential and CMP on the day of next visit  She is in agreement with the current plan of care. She'll call with any questions or concerns prior to her next visit   Total time spent: 25 minutes   Wilmon Arms, MD   Medical oncology  10/01/2013 2:40 PM

## 2013-10-05 ENCOUNTER — Telehealth: Payer: Self-pay

## 2013-10-05 NOTE — Telephone Encounter (Signed)
[name] PC:[id number] Call patient Received: 4 days ago     Wilmon Arms, MD Prentiss Bells, RN            Pl obtain Mammo report. In the system it shows pt had mammogram on June first but I am not able to see.  Pl ask the pt , if she is taking calcium with vitamin D. If not she needs to take two tab once daily. She is on arimedix she needs to be on cal with vitamin D          Spoke with patient.  She did not have a mammogram on June 1, she is not due until November.  June 1 order was entered by Dr. Terrill Mohr and closed - unsure how when it was not conducted.  An order was entered by Dr. Terrill Mohr for mammo to be done in November.  Let pt know not to be concerned - problem on our end with computer system and there is a valid order in system for November.  Pt voiced understanding.

## 2013-10-18 ENCOUNTER — Other Ambulatory Visit: Payer: Self-pay | Admitting: Physician Assistant

## 2013-10-21 ENCOUNTER — Telehealth (INDEPENDENT_AMBULATORY_CARE_PROVIDER_SITE_OTHER): Payer: Self-pay

## 2013-10-21 DIAGNOSIS — C50912 Malignant neoplasm of unspecified site of left female breast: Secondary | ICD-10-CM

## 2013-10-21 NOTE — Telephone Encounter (Signed)
Placed order in epic for PT per Dr Donne Hazel.

## 2013-10-26 ENCOUNTER — Other Ambulatory Visit: Payer: Self-pay | Admitting: Internal Medicine

## 2013-10-27 ENCOUNTER — Ambulatory Visit: Payer: Self-pay | Admitting: Emergency Medicine

## 2013-11-02 ENCOUNTER — Other Ambulatory Visit: Payer: Self-pay | Admitting: Internal Medicine

## 2013-11-04 ENCOUNTER — Other Ambulatory Visit: Payer: Self-pay | Admitting: Physician Assistant

## 2013-11-09 ENCOUNTER — Ambulatory Visit (INDEPENDENT_AMBULATORY_CARE_PROVIDER_SITE_OTHER): Payer: Medicare Other | Admitting: Physician Assistant

## 2013-11-09 VITALS — BP 122/72 | HR 76 | Temp 98.1°F | Resp 16 | Wt 184.0 lb

## 2013-11-09 DIAGNOSIS — N183 Chronic kidney disease, stage 3 unspecified: Secondary | ICD-10-CM

## 2013-11-09 DIAGNOSIS — I10 Essential (primary) hypertension: Secondary | ICD-10-CM | POA: Diagnosis not present

## 2013-11-09 DIAGNOSIS — E559 Vitamin D deficiency, unspecified: Secondary | ICD-10-CM

## 2013-11-09 DIAGNOSIS — E119 Type 2 diabetes mellitus without complications: Secondary | ICD-10-CM | POA: Diagnosis not present

## 2013-11-09 DIAGNOSIS — E785 Hyperlipidemia, unspecified: Secondary | ICD-10-CM | POA: Diagnosis not present

## 2013-11-09 DIAGNOSIS — Z79899 Other long term (current) drug therapy: Secondary | ICD-10-CM | POA: Diagnosis not present

## 2013-11-09 LAB — CBC WITH DIFFERENTIAL/PLATELET
Basophils Absolute: 0 10*3/uL (ref 0.0–0.1)
Basophils Relative: 0 % (ref 0–1)
Eosinophils Absolute: 0.2 10*3/uL (ref 0.0–0.7)
Eosinophils Relative: 3 % (ref 0–5)
HEMATOCRIT: 38.9 % (ref 36.0–46.0)
HEMOGLOBIN: 12.8 g/dL (ref 12.0–15.0)
LYMPHS ABS: 1.5 10*3/uL (ref 0.7–4.0)
LYMPHS PCT: 20 % (ref 12–46)
MCH: 28.9 pg (ref 26.0–34.0)
MCHC: 32.9 g/dL (ref 30.0–36.0)
MCV: 87.8 fL (ref 78.0–100.0)
MONO ABS: 0.7 10*3/uL (ref 0.1–1.0)
Monocytes Relative: 9 % (ref 3–12)
Neutro Abs: 5.2 10*3/uL (ref 1.7–7.7)
Neutrophils Relative %: 68 % (ref 43–77)
Platelets: 237 10*3/uL (ref 150–400)
RBC: 4.43 MIL/uL (ref 3.87–5.11)
RDW: 14.4 % (ref 11.5–15.5)
WBC: 7.6 10*3/uL (ref 4.0–10.5)

## 2013-11-09 LAB — HEMOGLOBIN A1C
HEMOGLOBIN A1C: 11.1 % — AB (ref <5.7)
Mean Plasma Glucose: 272 mg/dL — ABNORMAL HIGH (ref <117)

## 2013-11-09 NOTE — Patient Instructions (Signed)
Blood Glucose Monitoring Monitoring your blood glucose (also know as blood sugar) helps you to manage your diabetes. It also helps you and your health care provider monitor your diabetes and determine how well your treatment plan is working. WHY SHOULD YOU MONITOR YOUR BLOOD GLUCOSE?  It can help you understand how food, exercise, and medicine affect your blood glucose.  It allows you to know what your blood glucose is at any given moment. You can quickly tell if you are having low blood glucose (hypoglycemia) or high blood glucose (hyperglycemia).  It can help you and your health care provider know how to adjust your medicines.  It can help you understand how to manage an illness or adjust medicine for exercise. WHEN SHOULD YOU TEST? Your health care provider will help you decide how often you should check your blood glucose. This may depend on the type of diabetes you have, your diabetes control, or the types of medicines you are taking. Be sure to write down all of your blood glucose readings so that this information can be reviewed with your health care provider. See below for examples of testing times that your health care provider may suggest.  In the morning  Between meals and 2 hours after a meal.  Occasionally between 2:00 a.m. and 3:00 a.m.   HOW TO MONITOR YOUR BLOOD GLUCOSE Supplies Needed  Blood glucose meter.  Test strips for your meter. Each meter has its own strips. You must use the strips that go with your own meter.  A pricking needle (lancet).  A device that holds the lancet (lancing device).  A journal or log book to write down your results. Procedure  Wash your hands with soap and water. Alcohol is not preferred.  Prick the side of your finger (not the tip) with the lancet.  Gently milk the finger until a small drop of blood appears.  Follow the instructions that come with your meter for inserting the test strip, applying blood to the strip, and using  your blood glucose meter. Other Areas to Get Blood for Testing Some meters allow you to use other areas of your body (other than your finger) to test your blood. These areas are called alternative sites. The most common alternative sites are:  The forearm.  The thigh.  The back area of the lower leg.  The palm of the hand. The blood flow in these areas is slower. Therefore, the blood glucose values you get may be delayed, and the numbers are different from what you would get from your fingers. Do not use alternative sites if you think you are having hypoglycemia. Your reading will not be accurate. Always use a finger if you are having hypoglycemia. Also, if you cannot feel your lows (hypoglycemia unawareness), always use your fingers for your blood glucose checks. ADDITIONAL TIPS FOR GLUCOSE MONITORING  Do not reuse lancets.  Always carry your supplies with you.  All blood glucose meters have a 24-hour "hotline" number to call if you have questions or need help.  Adjust (calibrate) your blood glucose meter with a control solution after finishing a few boxes of strips. BLOOD GLUCOSE RECORD KEEPING It is a good idea to keep a daily record or log of your blood glucose readings. Most glucose meters, if not all, keep your glucose records stored in the meter. Some meters come with the ability to download your records to your home computer. Keeping a record of your blood glucose readings is especially helpful if you  are wanting to look for patterns. Make notes to go along with the blood glucose readings because you might forget what happened at that exact time. Keeping good records helps you and your health care provider to work together to achieve good diabetes management.  Document Released: 03/27/2003 Document Revised: 08/08/2013 Document Reviewed: 08/16/2012 [facility] Patient Information 2015 [place], [place]. This information is not intended to replace advice given to you by your health care  provider. Make sure you discuss any questions you have with your health care provider.

## 2013-11-09 NOTE — Progress Notes (Signed)
Assessment and Plan:  Hypertension: Continue medication, monitor blood pressure at home. Continue DASH diet. Cholesterol: Continue diet and exercise. Check cholesterol.  Diabetes with multiple comorbidities-Continue diet and exercise. Check A1C- need to check sugars 2-3 times a day and then bring in numbers. She has two more months of Lantus but she may benefit from [name] switching to Novolog mix twice daily and stopping the Amaryl. Discussed general issues about diabetes pathophysiology and management., Educational material distributed., Suggested low cholesterol diet., Encouraged aerobic exercise., Discussed foot care., Reminded to get yearly retinal exam. Vitamin D Def- check level and continue medications.   Continue diet and meds as discussed. Further disposition pending results of labs. Discussed med's effects and SE's.    HPI 68 y.o. female  presents for 3 month follow up with hypertension, hyperlipidemia, diabetes and vitamin D. Her blood pressure has been controlled at home, today their BP is BP: 122/72 mmHg She does not workout often due to back pain and her foot pain. She denies chest pain, shortness of breath, dizziness.  She is on cholesterol medication and denies myalgias. Her cholesterol is at goal. 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 She is on Lantus 35 units daily, she is on Amaryl 4mg  BID. Last A1C in the office was:  Lab Results  Component Value Date   HGBA1C 10.5* 06/04/2013   Patient is on Vitamin D supplement. Lab Results  Component Value Date   VD25OH 58 09/01/2013      Current Medications:  Current Outpatient Prescriptions on File Prior to Visit  Medication Sig Dispense Refill  . allopurinol (ZYLOPRIM) 100 MG tablet TAKE 1 TABLET TWICE DAILY  180  tablet  0  . Alpha-Lipoic Acid 200 MG CAPS Take 200 mg by mouth 2 (two) times daily.      Marland Kitchen anastrozole (ARIMIDEX) 1 MG tablet Take 1 tablet (1 mg total) by mouth daily.  90 tablet  4  . aspirin 325 MG tablet Take 325 mg by mouth at bedtime.       Marland Kitchen aspirin-acetaminophen-caffeine (EXCEDRIN MIGRAINE) 250-250-65 MG per tablet Take 1-2 tablets by mouth every 6 (six) hours as needed (sinus headache).      . carvedilol (COREG) 12.5 MG tablet Take 1 tablet (12.5 mg total) by mouth 2 (two) times daily with a meal.  180 tablet  0  . Coenzyme Q10 (CO Q-10) 100 MG CAPS Take 100 mg by mouth 2 (two) times daily.      Marland Kitchen COLCRYS 0.6 MG tablet Take 0.6 mg by mouth daily as needed (for gout flareups).       Marland Kitchen glimepiride (AMARYL) 4 MG tablet TAKE 1 TABLET TWICE DAILY  180 tablet  0  . glucose blood (PRODIGY TEST) test strip Use as instructed  100 each  4  . hyaluronate sodium (RADIAPLEXRX) GEL Apply 1 application topically 2 (two) times daily. Apply after rad tx  And bedtime daily,not 4 hours prior to rad tx      . hydrochlorothiazide (HYDRODIURIL) 12.5 MG tablet TAKE 1 TABLET EVERY DAY  90 tablet  1  . insulin glargine (LANTUS) 100 UNIT/ML injection Inject 35 Units into the skin every morning.       . Insulin Pen Needle 32G X 4 MM  MISC Check glucose 3 times a day  100 each  3  . Melatonin 5 MG TABS Take 5 mg by mouth at bedtime as needed (for sleep).      . metformin (FORTAMET) 1000 MG (OSM) 24 hr tablet Take 1,000 mg by mouth 2 (two) times daily with a meal.      . OVER THE COUNTER MEDICATION 2 tablets 1 day or 1 dose. CinSulin (True Nature) Vitamin D, Cinnamon bark and Chromium      . oxyCODONE (OXY IR/ROXICODONE) 5 MG immediate release tablet Take 1 tablet (5 mg total) by mouth every 4 (four) hours as needed for moderate pain.  30 tablet  0  . Polyvinyl Alcohol-Povidone (REFRESH OP) Place 1 drop into both eyes daily as needed (dry eyes).      . rosuvastatin (CRESTOR) 20 MG tablet Take 20 mg by mouth daily.       . valsartan (DIOVAN) 320 MG tablet TAKE 1 TABLET EVERY MORNING FOR BLOOD PRESSURE  90 tablet  0   No current facility-administered medications on file prior to visit.   Medical History:  Past Medical History  Diagnosis Date  . Hypertension   . Coronary artery disease     NSTEMI in 05/2009;  LHC with 2 v CAD - > CABG (LIMA-LAD, SVG-diagonal, SVG-OM1/OM 2).   . Hyperlipidemia   . Ischemic cardiomyopathy     Echo 07/2009: Mild LVH, EF 40-45%, inferior and posterior HK, grade 1 diastolic dysfunction, moderate MAC, mild to moderate MR, mild LAE  . PAD (peripheral artery disease)   . Family history of anesthesia complication     Mom has a hard time to wake up  . Neuropathy     Hx; of B/L feet  . Type II diabetes mellitus   . Foot ulcer     "I've had them on both feet" (08/27/2012)  . Osteomyelitis of foot   . Sinus headache     "occasionally" (08/27/2012)  . CKD (chronic kidney disease), stage I   . 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 Allergy   . NSTEMI (non-ST elevated myocardial infarction) 05/23/2009    Archie Endo 05/23/2009 (08/27/2012)  . History of radiation therapy 06/09/13-07/06/13    left breast 50Gy  . Type II or unspecified type diabetes mellitus without mention of complication, not stated as uncontrolled   . Arthritis     "maybe in my lower back" (08/27/2012)  . Gouty arthritis     "right index finger" (08/27/2012)  . Vitamin D deficiency    Allergies:  Allergies  Allergen Reactions  . Atenolol Other (See Comments)    Exacerbates gout  . Contrast Media [Iodinated Diagnostic Agents] Rash  . Latex Rash  . Omnipaque [Iohexol] Rash     Review of Systems: see HPI Family history- Review and unchanged Social history- Review and unchanged Physical Exam: BP 122/72  Pulse 76  Temp(Src) 98.1 F (36.7 C)  Resp 16  Wt 184 lb (83.462 kg) Wt Readings from Last 3 Encounters:  11/09/13 184 lb (83.462 kg)  09/27/13 179 lb 11.2 oz  (81.511 kg)  09/05/13 177 lb 9.6 oz (80.559 kg)   General Appearance: Well nourished, in no apparent distress. Eyes: PERRLA, EOMs, conjunctiva no swelling or erythema Sinuses: No Frontal/maxillary tenderness ENT/Mouth: Ext aud canals clear, TMs without erythema, bulging. No erythema, swelling, or exudate on post pharynx.  Tonsils not swollen or erythematous. Hearing normal.  Neck: Supple, thyroid normal.  Respiratory:  Respiratory effort normal, BS equal bilaterally without rales, rhonchi, wheezing or stridor.  Cardio: RRR with no MRGs.   Abdomen: Soft, + BS.  Non tender, no guarding, rebound, hernias, masses. Lymphatics: Non tender without lymphadenopathy.  Musculoskeletal: Full ROM, 5/5 strength, normal gait.  Skin: Warm, dry without rashes, lesions, ecchymosis.  Neuro: Cranial nerves intact. No cerebellar symptoms. Psych: Awake and oriented X 3, normal affect, Insight and Judgment appropriate.    Vicie Mutters 11:28 AM

## 2013-11-10 LAB — BASIC METABOLIC PANEL WITH GFR
BUN: 37 mg/dL — AB (ref 6–23)
CHLORIDE: 101 meq/L (ref 96–112)
CO2: 30 meq/L (ref 19–32)
Calcium: 10.1 mg/dL (ref 8.4–10.5)
Creat: 1.51 mg/dL — ABNORMAL HIGH (ref 0.50–1.10)
GFR, Est African American: 41 mL/min — ABNORMAL LOW
GFR, Est Non African American: 36 mL/min — ABNORMAL LOW
Glucose, Bld: 243 mg/dL — ABNORMAL HIGH (ref 70–99)
POTASSIUM: 5.1 meq/L (ref 3.5–5.3)
Sodium: 138 mEq/L (ref 135–145)

## 2013-11-10 LAB — HEPATIC FUNCTION PANEL
ALBUMIN: 3.8 g/dL (ref 3.5–5.2)
ALK PHOS: 92 U/L (ref 39–117)
ALT: 35 U/L (ref 0–35)
AST: 25 U/L (ref 0–37)
BILIRUBIN INDIRECT: 0.3 mg/dL (ref 0.2–1.2)
BILIRUBIN TOTAL: 0.4 mg/dL (ref 0.2–1.2)
Bilirubin, Direct: 0.1 mg/dL (ref 0.0–0.3)
TOTAL PROTEIN: 6.3 g/dL (ref 6.0–8.3)

## 2013-11-10 LAB — TSH: TSH: 1.278 u[IU]/mL (ref 0.350–4.500)

## 2013-11-10 LAB — LIPID PANEL
Cholesterol: 122 mg/dL (ref 0–200)
HDL: 26 mg/dL — ABNORMAL LOW (ref >39)
Total CHOL/HDL Ratio: 4.7 Ratio
Triglycerides: 487 mg/dL — ABNORMAL HIGH (ref <150)

## 2013-11-10 LAB — MAGNESIUM: Magnesium: 2.1 mg/dL (ref 1.5–2.5)

## 2013-11-10 NOTE — Addendum Note (Signed)
Addended by: Vicie Mutters R on: 11/10/2013 08:11 AM   Modules accepted: Orders

## 2013-11-14 ENCOUNTER — Ambulatory Visit: Payer: Medicare Other | Attending: General Surgery | Admitting: Physical Therapy

## 2013-11-14 DIAGNOSIS — M545 Low back pain, unspecified: Secondary | ICD-10-CM | POA: Insufficient documentation

## 2013-11-14 DIAGNOSIS — R5381 Other malaise: Secondary | ICD-10-CM | POA: Diagnosis not present

## 2013-11-14 DIAGNOSIS — Z5189 Encounter for other specified aftercare: Secondary | ICD-10-CM | POA: Insufficient documentation

## 2013-11-14 DIAGNOSIS — I70219 Atherosclerosis of native arteries of extremities with intermittent claudication, unspecified extremity: Secondary | ICD-10-CM | POA: Diagnosis not present

## 2013-11-14 DIAGNOSIS — C50919 Malignant neoplasm of unspecified site of unspecified female breast: Secondary | ICD-10-CM | POA: Diagnosis not present

## 2013-11-14 DIAGNOSIS — G8929 Other chronic pain: Secondary | ICD-10-CM | POA: Diagnosis not present

## 2013-11-14 DIAGNOSIS — E119 Type 2 diabetes mellitus without complications: Secondary | ICD-10-CM | POA: Insufficient documentation

## 2013-11-14 DIAGNOSIS — R262 Difficulty in walking, not elsewhere classified: Secondary | ICD-10-CM | POA: Insufficient documentation

## 2013-11-14 DIAGNOSIS — R5383 Other fatigue: Secondary | ICD-10-CM

## 2013-11-17 ENCOUNTER — Encounter: Payer: Medicare Other | Admitting: Physical Therapy

## 2013-11-21 ENCOUNTER — Ambulatory Visit: Payer: Medicare Other | Admitting: Physical Therapy

## 2013-11-21 DIAGNOSIS — Z5189 Encounter for other specified aftercare: Secondary | ICD-10-CM | POA: Diagnosis not present

## 2013-12-01 ENCOUNTER — Ambulatory Visit: Payer: Medicare Other | Admitting: Physical Therapy

## 2013-12-05 ENCOUNTER — Ambulatory Visit: Payer: Medicare Other | Admitting: Physical Therapy

## 2013-12-05 DIAGNOSIS — Z5189 Encounter for other specified aftercare: Secondary | ICD-10-CM | POA: Diagnosis not present

## 2013-12-08 ENCOUNTER — Ambulatory Visit: Payer: Medicare Other | Attending: General Surgery | Admitting: Physical Therapy

## 2013-12-08 DIAGNOSIS — R5381 Other malaise: Secondary | ICD-10-CM | POA: Insufficient documentation

## 2013-12-08 DIAGNOSIS — G8929 Other chronic pain: Secondary | ICD-10-CM | POA: Insufficient documentation

## 2013-12-08 DIAGNOSIS — C50919 Malignant neoplasm of unspecified site of unspecified female breast: Secondary | ICD-10-CM | POA: Diagnosis not present

## 2013-12-08 DIAGNOSIS — M545 Low back pain, unspecified: Secondary | ICD-10-CM | POA: Diagnosis not present

## 2013-12-08 DIAGNOSIS — Z5189 Encounter for other specified aftercare: Secondary | ICD-10-CM | POA: Diagnosis not present

## 2013-12-08 DIAGNOSIS — R5383 Other fatigue: Secondary | ICD-10-CM | POA: Diagnosis not present

## 2013-12-08 DIAGNOSIS — I70219 Atherosclerosis of native arteries of extremities with intermittent claudication, unspecified extremity: Secondary | ICD-10-CM | POA: Diagnosis not present

## 2013-12-08 DIAGNOSIS — R262 Difficulty in walking, not elsewhere classified: Secondary | ICD-10-CM | POA: Insufficient documentation

## 2013-12-08 DIAGNOSIS — E119 Type 2 diabetes mellitus without complications: Secondary | ICD-10-CM | POA: Diagnosis not present

## 2013-12-14 ENCOUNTER — Other Ambulatory Visit: Payer: Self-pay | Admitting: *Deleted

## 2013-12-14 ENCOUNTER — Ambulatory Visit: Payer: Medicare Other | Admitting: Internal Medicine

## 2013-12-14 ENCOUNTER — Encounter: Payer: Self-pay | Admitting: Internal Medicine

## 2013-12-14 ENCOUNTER — Ambulatory Visit (INDEPENDENT_AMBULATORY_CARE_PROVIDER_SITE_OTHER): Payer: Medicare Other | Admitting: Internal Medicine

## 2013-12-14 ENCOUNTER — Telehealth: Payer: Self-pay | Admitting: Internal Medicine

## 2013-12-14 VITALS — BP 134/80 | HR 85 | Temp 97.3°F | Resp 12 | Ht 64.75 in | Wt 179.0 lb

## 2013-12-14 DIAGNOSIS — E1151 Type 2 diabetes mellitus with diabetic peripheral angiopathy without gangrene: Secondary | ICD-10-CM

## 2013-12-14 DIAGNOSIS — IMO0002 Reserved for concepts with insufficient information to code with codable children: Secondary | ICD-10-CM

## 2013-12-14 DIAGNOSIS — E1165 Type 2 diabetes mellitus with hyperglycemia: Principal | ICD-10-CM

## 2013-12-14 DIAGNOSIS — I798 Other disorders of arteries, arterioles and capillaries in diseases classified elsewhere: Secondary | ICD-10-CM | POA: Diagnosis not present

## 2013-12-14 DIAGNOSIS — E1159 Type 2 diabetes mellitus with other circulatory complications: Secondary | ICD-10-CM | POA: Diagnosis not present

## 2013-12-14 DIAGNOSIS — I2510 Atherosclerotic heart disease of native coronary artery without angina pectoris: Secondary | ICD-10-CM | POA: Diagnosis not present

## 2013-12-14 MED ORDER — INSULIN LISPRO 100 UNIT/ML (KWIKPEN): 5.0000 [IU] | PEN_INJECTOR | Freq: Three times a day (TID) | SUBCUTANEOUS | Status: DC

## 2013-12-14 MED ORDER — GLUCOSE BLOOD VI STRP: ORAL_STRIP | Status: DC

## 2013-12-14 MED ORDER — INSULIN PEN NEEDLE 32G X 4 MM MISC: Status: DC

## 2013-12-14 MED ORDER — INSULIN GLARGINE 100 UNIT/ML ~~LOC~~ SOLN: 30.0000 [IU] | Freq: Every morning | SUBCUTANEOUS | Status: DC

## 2013-12-14 NOTE — Progress Notes (Signed)
Patient ID: [name], female   DOB: 1945-10-11, 68 y.o.   MRN: [id number]  HPI: [name] is a 68 y.o.-year-old female, referred by her PCP, Dr. Melford Aase, for management of DM2, insulin-dependent, uncontrolled, with complications (CAD, PAD, CKD stage 3, h/o foot ulcer, PN).  Patient has been diagnosed with diabetes in 2015; she started insulin in 2014. Last hemoglobin A1c was: Lab Results  Component Value Date   HGBA1C 11.1* 11/09/2013   HGBA1C 10.5* 06/04/2013   HGBA1C 10.8* 10/04/2011   Pt is on a regimen of: - Lantus 35 units qam - Metformin XR 1000 mg bid - Glimepiride 4 mg bid She was on Actos, Januvia.  Pt checks her sugars once a day and they are: - am: high 100s-low 200s - 2h after b'fast: n/c - before lunch: n/c - 2h after lunch: n/c  - before dinner:n/c - 2h after dinner: n/c  - bedtime: n/c - nighttime: n/c No lows. Lowest sugar was 70- on NovoLog in the hospital; she has hypoglycemia awareness at ~70.  Highest sugar was 200s.  Pt's meals are: - Breakfast: toast, eggs, omelet, cereal with fruit - Lunch: sandwich or salad or leftovers - Dinner: meat + 2 veggies or salad - Snacks: 1 - varies: nabs, veggies + humus, apple + PB, cheese She is on Arimidex >> craves sugars.   - + CKD, last BUN/creatinine:  Lab Results  Component Value Date   BUN 37* 11/09/2013   CREATININE 1.51* 11/09/2013  On Valsartan - last set of lipids: Lab Results  Component Value Date   CHOL 122 11/09/2013   HDL 26* 11/09/2013   LDLCALC NOT CALC 11/09/2013   TRIG 487* 11/09/2013   CHOLHDL 4.7 11/09/2013  On Crestor. - last eye exam was in fall/2014. No DR - reportedly. Had cataract surgery OU. - + numbness and tingling in her feet.  Pt has FH of DM in P[place], father, mother, brothers.   She has a h/o BrCa and had surgery in 04/2013, then radiation tx up to 07/2013. During this period, she was overwhelmed >> did not check sugars, but did take her meds.  ROS: Constitutional: + weight gain, +  fatigue,+ both subjective hyperthermia/hypothermia Eyes: + blurry vision, + xerophthalmia ENT: + sore throat, no nodules palpated in throat, no dysphagia/odynophagia, no hoarseness, + hypoacusis Cardiovascular: no CP/SOB/palpitations/leg swelling Respiratory: + both: cough/SOB Gastrointestinal: no N/V/D/C Musculoskeletal: no muscle/+ joint aches Skin: + rash, + itching, + hair loss Neurological: no tremors/numbness/tingling/dizziness, + HA Psychiatric: no depression/anxiety + low libido  Past Medical History  Diagnosis Date  . Hypertension   . Coronary artery disease     NSTEMI in 05/2009;  LHC with 2 v CAD - > CABG (LIMA-LAD, SVG-diagonal, SVG-OM1/OM 2).   . Hyperlipidemia   . Ischemic cardiomyopathy     Echo 07/2009: Mild LVH, EF 40-45%, inferior and posterior HK, grade 1 diastolic dysfunction, moderate MAC, mild to moderate MR, mild LAE  . PAD (peripheral artery disease)   . Family history of anesthesia complication     Mom has a hard time to wake up  . Neuropathy     Hx; of B/L feet  . Type II diabetes mellitus   . Foot ulcer     "I've had them on both feet" (08/27/2012)  . Osteomyelitis of foot   . Sinus headache     "occasionally" (08/27/2012)  . CKD (chronic kidney disease), stage I   . Breast cancer 02/10/13    left breast bx=Invasive ductal  Ca,DCIS w/calcifications  . Allergy   . NSTEMI (non-ST elevated myocardial infarction) 05/23/2009    Archie Endo 05/23/2009 (08/27/2012)  . History of radiation therapy 06/09/13-07/06/13    left breast 50Gy  . Type II or unspecified type diabetes mellitus without mention of complication, not stated as uncontrolled   . Arthritis     "maybe in my lower back" (08/27/2012)  . Gouty arthritis     "right index finger" (08/27/2012)  . Vitamin D deficiency    Past Surgical History  Procedure Laterality Date  . Cataract extraction w/ intraocular lens  implant, bilateral  2000's  . Finger surgery Right     "index finger; turned out to be gout"  (08/27/2012)  . Coronary artery bypass graft  2011    "CABG X4" (08/27/2012)  . Dental surgery  04/30/11    "1 implant" (08/27/2012)  . Amputation ray Right 08/27/2012    5th ray/notes 08/27/2012  . Cardiac catheterization  05/24/2009    Archie Endo 05/24/2009 (08/27/2012)  . Amputation Right 08/27/2012    Procedure: AMPUTATION RAY;  Surgeon: Newt Minion, MD;  Location: [facility];  Service: Orthopedics;  Laterality: Right;  Right Foot 5th Ray Amputation  . Eye surgery    . Colonoscopy w/ biopsies and polypectomy  2010  . Breast lumpectomy  04/20/2013    with biopsy      DR WAKEFIELD  . Breast lumpectomy with needle localization and axillary sentinel lymph node bx Left 04/20/2013    Procedure: LEFT BREAST WIRE GUIDED LUMPECTOMY AND AXILLARY SENTINEL LYMPH NODE BX;  Surgeon: Rolm Bookbinder, MD;  Location: [facility];  Service: General;  Laterality: Left;  . Dilation and curettage of uterus    . Amputation Left 07/08/2013    Procedure: AMPUTATION RAY;  Surgeon: Newt Minion, MD;  Location: [facility];  Service: Orthopedics;  Laterality: Left;  Left Foot 2nd Ray Amputation   History   Social History  . Marital Status: Married    Spouse Name: [name]    Number of Children: 1   Occupational History  . retired   Social History Main Topics  . Smoking status: Never Smoker   . Smokeless tobacco: Never Used  . Alcohol Use: No  . Drug Use: No   Current Outpatient Prescriptions on File Prior to Visit  Medication Sig Dispense Refill  . allopurinol (ZYLOPRIM) 100 MG tablet TAKE 1 TABLET TWICE DAILY  180 tablet  0  . anastrozole (ARIMIDEX) 1 MG tablet Take 1 tablet (1 mg total) by mouth daily.  90 tablet  4  . aspirin 325 MG tablet Take 325 mg by mouth at bedtime.       Marland Kitchen aspirin-acetaminophen-caffeine (EXCEDRIN MIGRAINE) 250-250-65 MG per tablet Take 1-2 tablets by mouth every 6 (six) hours as needed (sinus headache).      . carvedilol (COREG) 12.5 MG tablet Take 1 tablet (12.5 mg total) by mouth 2 (two) times daily  with a meal.  180 tablet  0  . Coenzyme Q10 (CO Q-10) 100 MG CAPS Take 100 mg by mouth 2 (two) times daily.      Marland Kitchen COLCRYS 0.6 MG tablet Take 0.6 mg by mouth daily as needed (for gout flareups).       Marland Kitchen glimepiride (AMARYL) 4 MG tablet TAKE 1 TABLET TWICE DAILY  180 tablet  0  . glucose blood (PRODIGY TEST) test strip Use as instructed  100 each  4  . hyaluronate sodium (RADIAPLEXRX) GEL Apply 1 application topically 2 (two) times  daily. Apply after rad tx  And bedtime daily,not 4 hours prior to rad tx      . hydrochlorothiazide (HYDRODIURIL) 12.5 MG tablet TAKE 1 TABLET EVERY DAY  90 tablet  1  . insulin glargine (LANTUS) 100 UNIT/ML injection Inject 35 Units into the skin every morning.       . Insulin Pen Needle 32G X 4 MM MISC Check glucose 3 times a day  100 each  3  . Melatonin 5 MG TABS Take 5 mg by mouth at bedtime as needed (for sleep).      . metformin (FORTAMET) 1000 MG (OSM) 24 hr tablet Take 1,000 mg by mouth 2 (two) times daily with a meal.      . OVER THE COUNTER MEDICATION 2 tablets 1 day or 1 dose. CinSulin (True Nature) Vitamin D, Cinnamon bark and Chromium      . oxyCODONE (OXY IR/ROXICODONE) 5 MG immediate release tablet Take 1 tablet (5 mg total) by mouth every 4 (four) hours as needed for moderate pain.  30 tablet  0  . Polyvinyl Alcohol-Povidone (REFRESH OP) Place 1 drop into both eyes daily as needed (dry eyes).      . rosuvastatin (CRESTOR) 20 MG tablet Take 20 mg by mouth daily.      . valsartan (DIOVAN) 320 MG tablet TAKE 1 TABLET EVERY MORNING FOR BLOOD PRESSURE  90 tablet  0   No current facility-administered medications on file prior to visit.   Allergies  Allergen Reactions  . Atenolol Other (See Comments)    Exacerbates gout  . Contrast Media [Iodinated Diagnostic Agents] Rash  . Latex Rash  . Omnipaque [Iohexol] Rash   Family History  Problem Relation Age of Onset  . Kidney cancer Brother 73  . Hypertension Brother   . Breast cancer Sister 46    LCIS;  BRCA negative  . Throat cancer Father 49    smoker  . Breast cancer Maternal Aunt     dx in her 35s  . Breast cancer Sister 85    Lobular breast cancer   PE: BP 134/80  Pulse 85  Temp(Src) 97.3 F (36.3 C) (Oral)  Resp 12  Ht 5' 4.75" (1.645 m)  Wt 179 lb (81.194 kg)  BMI 30.00 kg/m2  SpO2 97% Wt Readings from Last 3 Encounters:  12/14/13 179 lb (81.194 kg)  11/09/13 184 lb (83.462 kg)  09/27/13 179 lb 11.2 oz (81.511 kg)   Constitutional: overweight, in NAD Eyes: PERRLA, EOMI, no exophthalmos ENT: moist mucous membranes, no thyromegaly, no cervical lymphadenopathy Cardiovascular: RRR, No MRG Respiratory: CTA B Gastrointestinal: abdomen soft, NT, ND, BS+ Musculoskeletal: no deformities, strength intact in all 4 Skin: moist, warm, no rashes Neurological: no tremor with outstretched hands, DTR normal in all 4  ASSESSMENT: 1. DM2, insulin-dependent, uncontrolled, with complications - CAD - h/o AMi, h/o CABG 2011 - PAD - CKD stage 3 - h/o foot ulcer - PN  PLAN:  1. Patient with long-standing, uncontrolled diabetes, on oral antidiabetic regimen + basal insulin, which became insufficient - We discussed about options for treatment, and I suggested to start mealtime insulin:  Patient Instructions  Please decrease the Lantus to 30 units and move at bedtime. Please start Humalog 15 minutes before meals as follows: - 5 units with a smaller meal - 6 units with a regular meal - 7 units with a larger meal. Continue Metformin XR 1000 mg 2x a day with meals for now. Stop Amaryl.  Please call me  for sugars consistently <80 or >200.  Please return in 1 month with your sugar log.   - Strongly advised her to start checking sugars at different times of the day - check 2-3 times a day, rotating checks - given sugar log and advised how to fill it and to bring it at next appt  - refilled strips (Prodigy) - given foot care handout and explained the principles  - given instructions  for hypoglycemia management "15-15 rule"  - advised for yearly eye exams >> she is UTD - Return to clinic in 1 mo with sugar log

## 2013-12-14 NOTE — Telephone Encounter (Signed)
Pharmacy [facility]) stated they haven't received prescription for the Novalog Patient ask if you could resend it please.  Thank you

## 2013-12-14 NOTE — Patient Instructions (Signed)
Please decrease the Lantus to 30 units and move at bedtime. Please start Humalog 15 minutes before meals as follows: - 5 units with a smaller meal - 6 units with a regular meal - 7 units with a larger meal. Continue Metformin XR 1000 mg 2x a day with meals for now. Stop Amaryl.  Please call me for sugars consistently <80 or >200.  Please return in 1 month with your sugar log.   PATIENT INSTRUCTIONS FOR TYPE 2 DIABETES:  **Please join MyChart!** - see attached instructions about how to join if you have not done so already.  DIET AND EXERCISE Diet and exercise is an important part of diabetic treatment.  We recommended aerobic exercise in the form of brisk walking (working between 40-60% of maximal aerobic capacity, similar to brisk walking) for 150 minutes per week (such as 30 minutes five days per week) along with 3 times per week performing 'resistance' training (using various gauge rubber tubes with handles) 5-10 exercises involving the major muscle groups (upper body, lower body and core) performing 10-15 repetitions (or near fatigue) each exercise. Start at half the above goal but build slowly to reach the above goals. If limited by weight, joint pain, or disability, we recommend daily walking in a swimming pool with water up to waist to reduce pressure from joints while allow for adequate exercise.    BLOOD GLUCOSES Monitoring your blood glucoses is important for continued management of your diabetes. Please check your blood glucoses 2-4 times a day: fasting, before meals and at bedtime (you can rotate these measurements - e.g. one day check before the 3 meals, the next day check before 2 of the meals and before bedtime, etc.).   HYPOGLYCEMIA (low blood sugar) Hypoglycemia is usually a reaction to not eating, exercising, or taking too much insulin/ other diabetes drugs.  Symptoms include tremors, sweating, hunger, confusion, headache, etc. Treat IMMEDIATELY with 15 grams of Carbs:   4  glucose tablets    cup regular juice/soda   2 tablespoons raisins   4 teaspoons sugar   1 tablespoon honey Recheck blood glucose in 15 mins and repeat above if still symptomatic/blood glucose <100.  RECOMMENDATIONS TO REDUCE YOUR RISK OF DIABETIC COMPLICATIONS: * Take your prescribed MEDICATION(S) * Follow a DIABETIC diet: Complex carbs, fiber rich foods, (monounsaturated and polyunsaturated) fats * AVOID saturated/trans fats, high fat foods, >2,300 mg salt per day. * EXERCISE at least 5 times a week for 30 minutes or preferably daily.  * DO NOT SMOKE OR DRINK more than 1 drink a day. * Check your FEET every day. Do not wear tightfitting shoes. Contact us if you develop an ulcer * See your EYE doctor once a year or more if needed * Get a FLU shot once a year * Get a PNEUMONIA vaccine once before and once after age 23 years  GOALS:  * Your Hemoglobin A1c of <7%  * fasting sugars need to be <130 * after meals sugars need to be <180 (2h after you start eating) * Your Systolic BP should be [id number] or lower  * Your Diastolic BP should be 80 or lower  * Your HDL (Good Cholesterol) should be 40 or higher  * Your LDL (Bad Cholesterol) should be 100 or lower. * Your Triglycerides should be 150 or lower  * Your Urine microalbumin (kidney function) should be <30 * Your Body Mass Index should be 25 or lower    Please consider the following ways to cut down  carbs and fat and increase fiber and micronutrients in your diet: - substitute whole grain for white bread or pasta - substitute brown rice for white rice - substitute 90-calorie flat bread pieces for slices of bread when possible - substitute sweet potatoes or yams for white potatoes - substitute humus for margarine - substitute tofu for cheese when possible - substitute almond or rice milk for regular milk (would not drink soy milk daily due to concern for soy estrogen influence on breast cancer risk) - substitute dark chocolate for other  sweets when possible - substitute water - can add lemon or orange slices for taste - for diet sodas (artificial sweeteners will trick your body that you can eat sweets without getting calories and will lead you to overeating and weight gain in the long run) - do not skip breakfast or other meals (this will slow down the metabolism and will result in more weight gain over time)  - can try smoothies made from fruit and almond/rice milk in am instead of regular breakfast - can also try old-fashioned (not instant) oatmeal made with almond/rice milk in am - order the dressing on the side when eating salad at a restaurant (pour less than half of the dressing on the salad) - eat as little meat as possible - can try juicing, but should not forget that juicing will get rid of the fiber, so would alternate with eating raw veg./fruits or drinking smoothies - use as little oil as possible, even when using olive oil - can dress a salad with a mix of balsamic vinegar and lemon juice, for e.g. - use agave nectar, stevia sugar, or regular sugar rather than artificial sweateners - steam or broil/roast veggies  - snack on veggies/fruit/nuts (unsalted, preferably) when possible, rather than processed foods - reduce or eliminate aspartame in diet (it is in diet sodas, chewing gum, etc) Read the labels!  Try to read Dr. Janene Harvey book: "Program for Reversing Diabetes" for other ideas for healthy eating.

## 2013-12-15 ENCOUNTER — Ambulatory Visit: Payer: Medicare Other | Admitting: Physical Therapy

## 2013-12-15 DIAGNOSIS — Z5189 Encounter for other specified aftercare: Secondary | ICD-10-CM | POA: Diagnosis not present

## 2013-12-19 ENCOUNTER — Other Ambulatory Visit: Payer: Self-pay | Admitting: *Deleted

## 2013-12-19 ENCOUNTER — Ambulatory Visit: Payer: Medicare Other | Admitting: Physical Therapy

## 2013-12-19 DIAGNOSIS — Z5189 Encounter for other specified aftercare: Secondary | ICD-10-CM | POA: Diagnosis not present

## 2013-12-19 MED ORDER — ALLOPURINOL 100 MG PO TABS: ORAL_TABLET | ORAL | Status: DC

## 2013-12-26 ENCOUNTER — Ambulatory Visit: Payer: Medicare Other | Admitting: Physical Therapy

## 2013-12-26 DIAGNOSIS — Z5189 Encounter for other specified aftercare: Secondary | ICD-10-CM | POA: Diagnosis not present

## 2013-12-27 ENCOUNTER — Ambulatory Visit (HOSPITAL_BASED_OUTPATIENT_CLINIC_OR_DEPARTMENT_OTHER): Payer: Medicare Other | Admitting: Oncology

## 2013-12-27 ENCOUNTER — Telehealth: Payer: Self-pay | Admitting: Oncology

## 2013-12-27 VITALS — BP 179/74 | HR 90 | Temp 98.3°F | Resp 18 | Ht 64.75 in | Wt 182.8 lb

## 2013-12-27 DIAGNOSIS — C50419 Malignant neoplasm of upper-outer quadrant of unspecified female breast: Secondary | ICD-10-CM | POA: Diagnosis not present

## 2013-12-27 DIAGNOSIS — M549 Dorsalgia, unspecified: Secondary | ICD-10-CM

## 2013-12-27 DIAGNOSIS — N899 Noninflammatory disorder of vagina, unspecified: Secondary | ICD-10-CM

## 2013-12-27 DIAGNOSIS — IMO0001 Reserved for inherently not codable concepts without codable children: Secondary | ICD-10-CM

## 2013-12-27 DIAGNOSIS — C50212 Malignant neoplasm of upper-inner quadrant of left female breast: Secondary | ICD-10-CM

## 2013-12-27 NOTE — Telephone Encounter (Signed)
per pfo to sch pt appt-gave pt copy of sch

## 2013-12-27 NOTE — Progress Notes (Signed)
ID: [name] OB: [birth year]  MR#: [id number]  PQZ#:[id number]  PCP: Alesia Richards, MD GYN:   SU: Rolm Bookbinder OTHER MD: Kyung Rudd, Meridee Score, [name] Cornella  CHIEF COMPLAINT: Estrogen receptor positive early breast cancer   CURRENT TREATMENT: Anastrozole    BREAST CANCER HISTORY:  From the original intake note:   Jasmia (also goes by [name]) had bilateral screening mammography at [facility] 02/02/2013. There was a 3 mm nodule in the left breast which was further evaluated November 4, confirming an irregular mass at the 1:00 position of the left breast, not confirmed by ultrasonography. Biopsy of this mass 02/10/2013 showed an invasive ductal carcinoma, grade 1, with a prognostic panel pending. Breast MRI has been scheduled for 02/24/2013  The patient's subsequent history is as detailed below  INTERVAL HISTORY: Dionne returns today for followup of her breast cancer. She has been on anastrozole since June of 2015. She is tolerating it remarkably well. Hot flashes are not a major issue. She does have some vaginal dryness which is of concern. Just arthralgias and myalgias chiefly involving her back, which are very intermittent and I don't think are clearly related to this medication. She is undergoing rehabilitation for this.  REVIEW OF SYSTEMS:  aside from the symptoms just mentioned it detailed review of systems today was negative.   PAST MEDICAL HISTORY: Past Medical History  Diagnosis Date  . Hypertension   . Coronary artery disease     NSTEMI in 05/2009;  LHC with 2 v CAD - > CABG (LIMA-LAD, SVG-diagonal, SVG-OM1/OM 2).   . Hyperlipidemia   . Ischemic cardiomyopathy     Echo 07/2009: Mild LVH, EF 40-45%, inferior and posterior HK, grade 1 diastolic dysfunction, moderate MAC, mild to moderate MR, mild LAE  . PAD (peripheral artery disease)   . Family history of anesthesia complication     Mom has a hard time to wake up  . Neuropathy     Hx; of B/L feet  . Type II  diabetes mellitus   . Foot ulcer     "I've had them on both feet" (08/27/2012)  . Osteomyelitis of foot   . Sinus headache     "occasionally" (08/27/2012)  . CKD (chronic kidney disease), stage I   . 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 Allergy   . NSTEMI (non-ST elevated myocardial infarction) 05/23/2009    Archie Endo 05/23/2009 (08/27/2012)  . History of radiation therapy 06/09/13-07/06/13    left breast 50Gy  . Type II or unspecified type diabetes mellitus without mention of complication, not stated as uncontrolled   . Arthritis     "maybe in my lower back" (08/27/2012)  . Gouty arthritis     "right index finger" (08/27/2012)  . Vitamin D deficiency     PAST SURGICAL HISTORY: Past Surgical History  Procedure Laterality Date  . Cataract extraction w/ intraocular lens  implant, bilateral  2000's  . Finger surgery Right     "index finger; turned out to be gout" (08/27/2012)  . Coronary artery bypass graft  2011    "CABG X4" (08/27/2012)  . Dental surgery  04/30/11    "1 implant" (08/27/2012)  . Amputation ray Right 08/27/2012    5th ray/notes 08/27/2012  . Cardiac catheterization  05/24/2009    Archie Endo 05/24/2009 (08/27/2012)  . Amputation Right 08/27/2012    Procedure: AMPUTATION RAY;  Surgeon: Newt Minion, MD;  Location: [place];  Service: Orthopedics;  Laterality: Right;  Right  Foot 5th Ray Amputation  . Eye surgery    . Colonoscopy w/ biopsies and polypectomy  2010  . Breast lumpectomy  04/20/2013    with biopsy      DR WAKEFIELD  . Breast lumpectomy with needle localization and axillary sentinel lymph node bx Left 04/20/2013    Procedure: LEFT BREAST WIRE GUIDED LUMPECTOMY AND AXILLARY SENTINEL LYMPH NODE BX;  Surgeon: Rolm Bookbinder, MD;  Location: [place];  Service: General;  Laterality: Left;  . Dilation and curettage of uterus    . Amputation Left 07/08/2013    Procedure: AMPUTATION RAY;  Surgeon: Newt Minion, MD;  Location: [place];  Service:  Orthopedics;  Laterality: Left;  Left Foot 2nd Ray Amputation    FAMILY HISTORY Family History  Problem Relation Age of Onset  . Kidney cancer Brother 72  . Hypertension Brother   . Breast cancer Sister 50    LCIS; BRCA negative  . Throat cancer Father 39    smoker  . Breast cancer Maternal Aunt     dx in her 69s  . Breast cancer Sister 23    Lobular breast cancer   patient's father died at the age of 12 from throat cancer. The patient's mother died at the age of 74 from "many medical problems". The patient had 4 brothers, 3 sisters. One brother had kidney cancer diagnosed at age 68. One sister was diagnosed with breast cancer at age 68 and died 11-05-12. A second sister was diagnosed with breast cancer at age 68. There is also one of the patient's mother's 5 sisters diagnosed with breast cancer remotely.  GYNECOLOGIC HISTORY:  Menarche age 68 am first live birth age 68, the patient is [place] P61. (She had a twin pregnancy, [name] and [name], delivered prematurely, with the babies dying after a half day). She underwent menopause in her late 23s and used hormone replacement for a few months.  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 Son [name] lives in [place] where he works as a Proofreader. The patient has 3 grandchildren. She attends [facility] [employer] in [place]: In place   HEALTH MAINTENANCE: History  Substance Use Topics  . Smoking status: Never Smoker   . Smokeless tobacco: Never Used  . Alcohol Use: No     Colonoscopy: 2010  PAP: 2010  Bone density: 10/21/2007 at [facility], normal  Lipid panel:  Allergies  Allergen Reactions  . Atenolol Other (See Comments)    Exacerbates gout  . Contrast Media [Iodinated Diagnostic Agents] Rash  . Latex Rash  . Omnipaque [Iohexol] Rash    Current Outpatient Prescriptions  Medication Sig Dispense Refill  .  allopurinol (ZYLOPRIM) 100 MG tablet TAKE 1 TABLET TWICE DAILY  180 tablet  2  . anastrozole (ARIMIDEX) 1 MG tablet Take 1 tablet (1 mg total) by mouth daily.  90 tablet  4  . aspirin 325 MG tablet Take 325 mg by mouth at bedtime.       Marland Kitchen aspirin-acetaminophen-caffeine (EXCEDRIN MIGRAINE) 250-250-65 MG per tablet Take 1-2 tablets by mouth every 6 (six) hours as needed (sinus headache).      . carvedilol (COREG) 12.5 MG tablet Take 1 tablet (12.5 mg total) by mouth 2 (two) times daily with a meal.  180 tablet  0  . Coenzyme Q10 (CO Q-10) 100 MG CAPS Take 100 mg by  mouth 2 (two) times daily.      Marland Kitchen COLCRYS 0.6 MG tablet Take 0.6 mg by mouth daily as needed (for gout flareups).       Marland Kitchen glucose blood test strip Check sugars 3x a day  200 each  11  . hyaluronate sodium (RADIAPLEXRX) GEL Apply 1 application topically 2 (two) times daily. Apply after rad tx  And bedtime daily,not 4 hours prior to rad tx      . hydrochlorothiazide (HYDRODIURIL) 12.5 MG tablet TAKE 1 TABLET EVERY DAY  90 tablet  1  . insulin glargine (LANTUS) 100 UNIT/ML injection Inject 0.3 mLs (30 Units total) into the skin every morning.  15 mL  3  . insulin lispro (HUMALOG KWIKPEN) 100 UNIT/ML KiwkPen Inject 0.05-0.07 mLs (5-7 Units total) into the skin 3 (three) times daily.  15 mL  3  . Insulin Pen Needle 32G X 4 MM MISC Inject 4 times a day  200 each  11  . Melatonin 5 MG TABS Take 5 mg by mouth at bedtime as needed (for sleep).      . metformin (FORTAMET) 1000 MG (OSM) 24 hr tablet Take 1,000 mg by mouth 2 (two) times daily with a meal.      . OVER THE COUNTER MEDICATION 2 tablets 1 day or 1 dose. CinSulin (True Nature) Vitamin D, Cinnamon bark and Chromium      . oxyCODONE (OXY IR/ROXICODONE) 5 MG immediate release tablet Take 1 tablet (5 mg total) by mouth every 4 (four) hours as needed for moderate pain.  30 tablet  0  . Polyvinyl Alcohol-Povidone (REFRESH OP) Place 1 drop into both eyes daily as needed (dry eyes).      .  rosuvastatin (CRESTOR) 20 MG tablet Take 20 mg by mouth daily.      . valsartan (DIOVAN) 320 MG tablet TAKE 1 TABLET EVERY MORNING FOR BLOOD PRESSURE  90 tablet  0   No current facility-administered medications for this visit.    OBJECTIVE: Middle-aged white woman  in no acute distress Filed Vitals:   12/27/13 1218  BP: 179/74  Pulse: 90  Temp: 98.3 F (36.8 C)  Resp: 18     Body mass index is 30.64 kg/(m^2).    ECOG FS:1 - Symptomatic but completely ambulatory  Ocular: Sclerae unicteric, EOMs intact Ear-nose-throat: Oropharynx clear and moist  Lymphatic: No cervical or supraclavicular adenopathy Lungs no rales or rhonchi Heart regular rate and rhythm, no murmur appreciated Abd soft, nontender, positive bowel sounds MSK no focal spinal tenderness, no upper extremity lymphedema Neuro: non-focal, well-oriented, positive affect Breasts: The right breast is unremarkable. The left breast is status post  lumpectomy and radiation. There is no evidence of local recurrence. Left axilla is benign  LAB RESULTS:  CMP     Component Value Date/Time   NA 138 11/09/2013 1146   NA 137 09/01/2013 1100   K 5.1 11/09/2013 1146   K 5.1 09/01/2013 1100   CL 101 11/09/2013 1146   CO2 30 11/09/2013 1146   CO2 23 09/01/2013 1100   GLUCOSE 243* 11/09/2013 1146   GLUCOSE 375* 09/01/2013 1100   BUN 37* 11/09/2013 1146   BUN 44.5* 09/01/2013 1100   CREATININE 1.51* 11/09/2013 1146   CREATININE 1.7* 09/01/2013 1100   CREATININE 1.55* 07/08/2013 0712   CALCIUM 10.1 11/09/2013 1146   CALCIUM 9.9 09/01/2013 1100   PROT 6.3 11/09/2013 1146   PROT 6.8 09/01/2013 1100   ALBUMIN 3.8 11/09/2013 1146   ALBUMIN  3.5 09/01/2013 1100   AST 25 11/09/2013 1146   AST 17 09/01/2013 1100   ALT 35 11/09/2013 1146   ALT 25 09/01/2013 1100   ALKPHOS 92 11/09/2013 1146   ALKPHOS 96 09/01/2013 1100   BILITOT 0.4 11/09/2013 1146   BILITOT 0.37 09/01/2013 1100   GFRNONAA 36* 11/09/2013 1146   GFRNONAA 34* 07/08/2013 0712   GFRAA 41* 11/09/2013 1146   GFRAA  39* 07/08/2013 0712    I No results found for this basename: SPEP,  UPEP,   kappa and lambda light chains    Lab Results  Component Value Date   WBC 7.6 11/09/2013   NEUTROABS 5.2 11/09/2013   HGB 12.8 11/09/2013   HCT 38.9 11/09/2013   MCV 87.8 11/09/2013   PLT 237 11/09/2013      Chemistry      Component Value Date/Time   NA 138 11/09/2013 1146   NA 137 09/01/2013 1100   K 5.1 11/09/2013 1146   K 5.1 09/01/2013 1100   CL 101 11/09/2013 1146   CO2 30 11/09/2013 1146   CO2 23 09/01/2013 1100   BUN 37* 11/09/2013 1146   BUN 44.5* 09/01/2013 1100   CREATININE 1.51* 11/09/2013 1146   CREATININE 1.7* 09/01/2013 1100   CREATININE 1.55* 07/08/2013 0712      Component Value Date/Time   CALCIUM 10.1 11/09/2013 1146   CALCIUM 9.9 09/01/2013 1100   ALKPHOS 92 11/09/2013 1146   ALKPHOS 96 09/01/2013 1100   AST 25 11/09/2013 1146   AST 17 09/01/2013 1100   ALT 35 11/09/2013 1146   ALT 25 09/01/2013 1100   BILITOT 0.4 11/09/2013 1146   BILITOT 0.37 09/01/2013 1100       No results found for this basename: LABCA2    No components found with this basename: LABCA125    No results found for this basename: INR,  in the last 168 hours  Urinalysis    Component Value Date/Time   COLORURINE YELLOW 05/30/2009 1500   APPEARANCEUR CLOUDY* 05/30/2009 1500   LABSPEC 1.029 05/30/2009 1500   PHURINE 5.0 05/30/2009 1500   GLUCOSEU NEGATIVE 05/30/2009 1500   HGBUR TRACE* 05/30/2009 1500   BILIRUBINUR NEGATIVE 05/30/2009 1500   KETONESUR NEGATIVE 05/30/2009 1500   PROTEINUR 100* 05/30/2009 1500   UROBILINOGEN 0.2 05/30/2009 1500   NITRITE NEGATIVE 05/30/2009 1500   LEUKOCYTESUR NEGATIVE 05/30/2009 1500    STUDIES: No results found.  ASSESSMENT: 68 y.o. BRCA negative [place] woman status post left breast biopsy 02/10/2013 for a clinical T1a N0, stage IA invasive ductal carcinoma, grade 1, with a prognostic panel pending  (1) status post left lumpectomy and sentinel lymph node sampling 04/20/2013 for a pT1a pN0, stage IA  invasive ductal carcinoma, grade 1, with negative margins  (2) completed adjuvant radiation 07/06/2013  (3) started anastrozole 09/05/2013; DEXA scan 07/28/2013 was normal with a T score of -0.3  PLAN: Briselda is doing fine on anastrozole and the plan will be to continue that for a total of 5 years.  Nothing the back pain that she has been experiencing more recently his contributed to her prior injury to that area. The arthralgias from anastrozole are generally diffuse, not focal.  She does have worsening vaginal dryness problems. I have referred her to our "intimacy and pelvic health" program. If it becomes sufficiently a concern we can switch to tamoxifen at some point and that would allow her to use local vaginal estrogen preparations  Otherwise the plan is to continue anastrozole for  a total of 5 years. Georgi has a good understanding of the overall plan, and agrees with it. She knows a goal of treatment is cure. She will call with any problems that may develop before her next visit here.    Chauncey Cruel, MD   12/27/2013 12:36 PM

## 2013-12-28 NOTE — Addendum Note (Signed)
Addended by: Jaci Carrel A on: 12/28/2013 09:01 AM   Modules accepted: Orders

## 2013-12-29 ENCOUNTER — Ambulatory Visit: Payer: Medicare Other | Admitting: Physical Therapy

## 2014-01-02 ENCOUNTER — Ambulatory Visit: Payer: Medicare Other | Admitting: Physical Therapy

## 2014-01-02 DIAGNOSIS — Z5189 Encounter for other specified aftercare: Secondary | ICD-10-CM | POA: Diagnosis not present

## 2014-01-04 ENCOUNTER — Other Ambulatory Visit: Payer: Self-pay | Admitting: Physician Assistant

## 2014-01-05 ENCOUNTER — Ambulatory Visit: Payer: Medicare Other | Admitting: Physical Therapy

## 2014-01-10 ENCOUNTER — Ambulatory Visit: Payer: Self-pay | Admitting: Physician Assistant

## 2014-01-19 DIAGNOSIS — L97514 Non-pressure chronic ulcer of other part of right foot with necrosis of bone: Secondary | ICD-10-CM | POA: Diagnosis not present

## 2014-01-19 DIAGNOSIS — E1149 Type 2 diabetes mellitus with other diabetic neurological complication: Secondary | ICD-10-CM | POA: Diagnosis not present

## 2014-01-19 DIAGNOSIS — M86171 Other acute osteomyelitis, right ankle and foot: Secondary | ICD-10-CM | POA: Diagnosis not present

## 2014-01-24 ENCOUNTER — Ambulatory Visit (INDEPENDENT_AMBULATORY_CARE_PROVIDER_SITE_OTHER): Payer: Medicare Other | Admitting: Internal Medicine

## 2014-01-24 ENCOUNTER — Telehealth: Payer: Self-pay | Admitting: Internal Medicine

## 2014-01-24 ENCOUNTER — Encounter: Payer: Self-pay | Admitting: Internal Medicine

## 2014-01-24 VITALS — BP 118/68 | HR 88 | Temp 98.2°F | Resp 12 | Wt 180.0 lb

## 2014-01-24 DIAGNOSIS — IMO0002 Reserved for concepts with insufficient information to code with codable children: Secondary | ICD-10-CM

## 2014-01-24 DIAGNOSIS — E1165 Type 2 diabetes mellitus with hyperglycemia: Principal | ICD-10-CM

## 2014-01-24 DIAGNOSIS — I2510 Atherosclerotic heart disease of native coronary artery without angina pectoris: Secondary | ICD-10-CM | POA: Diagnosis not present

## 2014-01-24 DIAGNOSIS — E1159 Type 2 diabetes mellitus with other circulatory complications: Secondary | ICD-10-CM

## 2014-01-24 DIAGNOSIS — E1151 Type 2 diabetes mellitus with diabetic peripheral angiopathy without gangrene: Secondary | ICD-10-CM

## 2014-01-24 NOTE — Progress Notes (Signed)
Patient ID: [name], female   DOB: 1946/01/24, 68 y.o.   MRN: [id number]  HPI: [name] is a 68 y.o.-year-old female, returning for f/u for DM2, dx 2014, insulin-dependent since 2015, uncontrolled, with complications (CAD, PAD, CKD stage 3, h/o foot ulcer, PN). Last visit 1.5 mo ago.  She started Doxycycline for a foot infection and also had a foot skin scraping (from a shoe) . She started exercise.  Last hemoglobin A1c was: Lab Results  Component Value Date   HGBA1C 11.1* 11/09/2013   HGBA1C 10.5* 06/04/2013   HGBA1C 10.8* 10/04/2011   Pt is on a regimen of: - Lantus 35 units qam - Metformin XR 1000 mg bid - Humalog 15 minutes before meals as follows - started 12/2013: - 5 units with a smaller meal - 6 units with a regular meal - 7 units with a larger meal. Continue Metformin XR 1000 mg 2x a day with meals for now. We stopped Glimepiride 4 mg bid, but she restarted as sugars were still high. She was on Actos, Januvia.  Pt checks her sugars once a day and they are variable, but most sugars are in am: - am: high 100s-low 200s >> 75, 82, 107-256, lower after Doxycycline - 2h after b'fast: n/c - before lunch: n/c >> 87-132 - 2h after lunch: n/c  - before dinner:n/c - 2h after dinner: n/c  - bedtime: n/c - nighttime: n/c No lows. Lowest sugar was 70- on NovoLog in the hospital; she has hypoglycemia awareness at ~70.  Highest sugar was 200s.  Pt's meals are: - Breakfast: toast, eggs, omelet, cereal with fruit - Lunch: sandwich or salad or leftovers - Dinner: meat + 2 veggies or salad - Snacks: 1 - varies: nabs, veggies + humus, apple + PB, cheese She is on Arimidex >> craves sugars.   She exercises after dinner.  - + CKD, last BUN/creatinine:  Lab Results  Component Value Date   BUN 37* 11/09/2013   CREATININE 1.51* 11/09/2013  On Valsartan - last set of lipids: Lab Results  Component Value Date   CHOL 122 11/09/2013   HDL 26* 11/09/2013   LDLCALC NOT CALC 11/09/2013   TRIG 487* 11/09/2013   CHOLHDL 4.7 11/09/2013  On Crestor. - last eye exam was in fall/2014. No DR - reportedly. Had cataract surgery OU. - + numbness and tingling in her feet.  She has a h/o BrCa and had surgery in 04/2013, then radiation tx up to 07/2013. During this period, she was overwhelmed >> did not check sugars, but did take her meds.  ROS: Constitutional: no weight gain/loss, no fatigue, no subjective hyperthermia/hypothermia Eyes: no blurry vision, no xerophthalmia ENT: no sore throat, no nodules palpated in throat, no dysphagia/odynophagia, no hoarseness Cardiovascular: no CP/SOB/palpitations/leg swelling Respiratory: no cough/SOB Gastrointestinal: no N/V/D/C Musculoskeletal: no muscle/joint aches Skin: no rashes Neurological: no tremors/numbness/tingling/dizziness  I reviewed pt's medications, allergies, PMH, social hx, family hx and no changes required, except as mentioned above.  PE: BP 118/68  Pulse 88  Temp(Src) 98.2 F (36.8 C) (Oral)  Resp 12  Wt 180 lb (81.647 kg)  SpO2 98% Wt Readings from Last 3 Encounters:  01/24/14 180 lb (81.647 kg)  12/27/13 182 lb 12.8 oz (82.918 kg)  12/14/13 179 lb (81.194 kg)   Constitutional: overweight, in NAD Eyes: PERRLA, EOMI, no exophthalmos ENT: moist mucous membranes, no thyromegaly, no cervical lymphadenopathy Cardiovascular: RRR, No MRG Respiratory: CTA B Gastrointestinal: abdomen soft, NT, ND, BS+ Musculoskeletal: no deformities, strength intact in  all 4 Skin: moist, warm, no rashes Neurological: no tremor with outstretched hands, DTR normal in all 4  ASSESSMENT: 1. DM2, insulin-dependent, uncontrolled, with complications - CAD - h/o AMi, h/o CABG 2011 - PAD - CKD stage 3 - h/o foot ulcer - PN  PLAN:  1. Patient with long-standing, uncontrolled diabetes, on oral antidiabetic regimen + basal insulin, which became insufficient >> she added back Amaryl. Sugars started to decrease after starting Doxycycline. - We  discussed about options for treatment, and I suggested to:  Patient Instructions  Please decrease the Lantus to 30 units at bedtime. Please start Humalog 15 minutes before meals as follows: - 5 units with a smaller meal  - 6 units with a regular meal  - 7 units with a larger meal. Continue Metformin XR 1000 mg 2x a day with meals for now. Stop Amaryl. Please call me for sugars consistently <80 or >200. Please return in 1.5 month with your sugar log.  - I advised her to increase the mealtime insulin by 1-2 units as needed based on the sugar values - Strongly advised her to start checking sugars at different times of the day - check 2-3 times a day, rotating checks - given more sugar logs  - advised for yearly eye exams >> she is UTD - Return to clinic in 1.5 mo with sugar log

## 2014-01-24 NOTE — Telephone Encounter (Signed)
I can advise this pt to her medication dosage, but please advise concerning the low blood sugar.

## 2014-01-24 NOTE — Telephone Encounter (Signed)
Called pt and advised her per Dr Arman Filter note. Clarified dosage and advised if blood sugar below 80 do not take insulin. If they are consistently below 80 call us. Pt understood.

## 2014-01-24 NOTE — Patient Instructions (Addendum)
Please decrease the Lantus to 30 units at bedtime. Please start Humalog 15 minutes before meals as follows: - 5 units with a smaller meal  - 6 units with a regular meal  - 7 units with a larger meal. Continue Metformin XR 1000 mg 2x a day with meals for now. Stop Amaryl.  Please call me for sugars consistently <80 or >200.  Please return in 1.5 month with your sugar log.

## 2014-01-24 NOTE — Telephone Encounter (Signed)
If sugar this low, she can skip the mealtime insulin.

## 2014-01-24 NOTE — Telephone Encounter (Signed)
Pt needs clarification on her meds  ? The shot of humalog prior to every meal-she has not had lunch yet and she just checked her bs it is 74  Please call back asap to clarify if she is still to take humalog?

## 2014-01-28 NOTE — Patient Instructions (Addendum)
Recommend the book "The END of DIETING" by Dr Baker Janus   and the book "The END of DIABETES " by Dr Excell Seltzer  At [email or web address] - get book & Audio CD's     Being diabetic has a  300% increased risk for heart attack, stroke, cancer, and alzheimer- type vascular dementia. It is very important that you work harder with diet by avoiding all foods that are white except chicken & fish. Avoid white rice (brown & wild rice is OK), white potatoes (sweetpotatoes in moderation is OK), White bread or wheat bread or anything made out of white flour like bagels, donuts, rolls, buns, biscuits, cakes, pastries, cookies, pizza crust, and pasta (made from white flour & egg whites) - vegetarian pasta or spinach or wheat pasta is OK. Multigrain breads like Arnold's or [employer], or multigrain sandwich thins or flatbreads.  Diet, exercise and weight loss can reverse and cure diabetes in the early stages.  Diet, exercise and weight loss is very important in the control and prevention of complications of diabetes which affects every system in your body, ie. Brain - dementia/stroke, eyes - glaucoma/blindness, heart - heart attack/heart failure, kidneys - dialysis, stomach - gastric paralysis, intestines - malabsorption, nerves - severe painful neuritis, circulation - gangrene & loss of a leg(s), and finally cancer and Alzheimers.    I recommend avoid fried & greasy foods,  sweets/candy, white rice (brown or wild rice or Quinoa is OK), white potatoes (sweet potatoes are OK) - anything made from white flour - bagels, doughnuts, rolls, buns, biscuits,white and wheat breads, pizza crust and traditional pasta made of white flour & egg white(vegetarian pasta or spinach or wheat pasta is OK).  Multi-grain bread is OK - like multi-grain flat bread or sandwich thins. Avoid alcohol in excess. Exercise is also important.    Eat all the vegetables you want - avoid meat, especially red meat and dairy - especially cheese.  Cheese is  the most concentrated form of trans-fats which is the worst thing to clog up our arteries. Veggie cheese is OK which can be found in the fresh produce section at [facility] or [employer] or [employer] ++++++++++++++++++++++++++++++++++++++++++++++++++++++++++++++++++++++  Preventive Care for Adults A healthy lifestyle and preventive care can promote health and wellness. Preventive health guidelines for women include the following key practices.  A routine yearly physical is a good way to check with your health care provider about your health and preventive screening. It is a chance to share any concerns and updates on your health and to receive a thorough exam.  Visit your dentist for a routine exam and preventive care every 6 months. Brush your teeth twice a day and floss once a day. Good oral hygiene prevents tooth decay and gum disease.  The frequency of eye exams is based on your age, health, family medical history, use of contact lenses, and other factors. Follow your health care provider's recommendations for frequency of eye exams.  Eat a healthy diet. Foods like vegetables, fruits, whole grains, low-fat dairy products, and lean protein foods contain the nutrients you need without too many calories. Decrease your intake of foods high in solid fats, added sugars, and salt. Eat the right amount of calories for you.Get information about a proper diet from your health care provider, if necessary.  Regular physical exercise is one of the most important things you can do for your health. Most adults should get at least 150 minutes of moderate-intensity exercise (any activity that increases your  heart rate and causes you to sweat) each week. In addition, most adults need muscle-strengthening exercises on 2 or more days a week.  Maintain a healthy weight. The body mass index (BMI) is a screening tool to identify possible weight problems. It provides an estimate of body fat based on height and  weight. Your health care provider can find your BMI and can help you achieve or maintain a healthy weight.For adults 20 years and older:  A BMI below 18.5 is considered underweight.  A BMI of 18.5 to 24.9 is normal.  A BMI of 25 to 29.9 is considered overweight.  A BMI of 30 and above is considered obese.  Maintain normal blood lipids and cholesterol levels by exercising and minimizing your intake of saturated fat. Eat a balanced diet with plenty of fruit and vegetables. If your lipid or cholesterol levels are high, you are over 50, or you are at high risk for heart disease, you may need your cholesterol levels checked more frequently.Ongoing high lipid and cholesterol levels should be treated with medicines if diet and exercise are not working.  If you smoke, find out from your health care provider how to quit. If you do not use tobacco, do not start.  Lung cancer screening is recommended for adults aged 41-80 years who are at high risk for developing lung cancer because of a history of smoking. A yearly low-dose CT scan of the lungs is recommended for people who have at least a 30-pack-year history of smoking and are a current smoker or have quit within the past 15 years. A pack year of smoking is smoking an average of 1 pack of cigarettes a day for 1 year (for example: 1 pack a day for 30 years or 2 packs a day for 15 years). Yearly screening should continue until the smoker has stopped smoking for at least 15 years. Yearly screening should be stopped for people who develop a health problem that would prevent them from having lung cancer treatment.  Avoid use of street drugs. Do not share needles with anyone. Ask for help if you need support or instructions about stopping the use of drugs.  High blood pressure causes heart disease and increases the risk of stroke.  Ongoing high blood pressure should be treated with medicines if weight loss and exercise do not work.  If you are 66-79 years  old, ask your health care provider if you should take aspirin to prevent strokes.  Diabetes screening involves taking a blood sample to check your fasting blood sugar level. This should be done once every 3 years, after age 13, if you are within normal weight and without risk factors for diabetes. Testing should be considered at a younger age or be carried out more frequently if you are overweight and have at least 1 risk factor for diabetes.  Breast cancer screening is essential preventive care for women. You should practice "breast self-awareness." This means understanding the normal appearance and feel of your breasts and may include breast self-examination. Any changes detected, no matter how small, should be reported to a health care provider. Women in their 23s and 30s should have a clinical breast exam (CBE) by a health care provider as part of a regular health exam every 1 to 3 years. After age 80, women should have a CBE every year. Starting at age 9, women should consider having a mammogram (breast X-ray test) every year. Women who have a family history of breast cancer should talk  to their health care provider about genetic screening. Women at a high risk of breast cancer should talk to their health care providers about having an MRI and a mammogram every year.  Breast cancer gene (BRCA)-related cancer risk assessment is recommended for women who have family members with BRCA-related cancers. BRCA-related cancers include breast, ovarian, tubal, and peritoneal cancers. Having family members with these cancers may be associated with an increased risk for harmful changes (mutations) in the breast cancer genes BRCA1 and BRCA2. Results of the assessment will determine the need for genetic counseling and BRCA1 and BRCA2 testing.  Routine pelvic exams to screen for cancer are no longer recommended for nonpregnant women who are considered low risk for cancer of the pelvic organs (ovaries, uterus, and  vagina) and who do not have symptoms. Ask your health care provider if a screening pelvic exam is right for you.  If you have had past treatment for cervical cancer or a condition that could lead to cancer, you need Pap tests and screening for cancer for at least 20 years after your treatment. If Pap tests have been discontinued, your risk factors (such as having a new sexual partner) need to be reassessed to determine if screening should be resumed. Some women have medical problems that increase the chance of getting cervical cancer. In these cases, your health care provider may recommend more frequent screening and Pap tests.    Colorectal cancer can be detected and often prevented. Most routine colorectal cancer screening begins at the age of 78 years and continues through age 38 years. However, your health care provider may recommend screening at an earlier age if you have risk factors for colon cancer. On a yearly basis, your health care provider may provide home test kits to check for hidden blood in the stool. Use of a small camera at the end of a tube, to directly examine the colon (sigmoidoscopy or colonoscopy), can detect the earliest forms of colorectal cancer. Talk to your health care provider about this at age 8, when routine screening begins. Direct exam of the colon should be repeated every 5-10 years through age 40 years, unless early forms of pre-cancerous polyps or small growths are found.  Osteoporosis is a disease in which the bones lose minerals and strength with aging. This can result in serious bone fractures or breaks. The risk of osteoporosis can be identified using a bone density scan. Women ages 55 years and over and women at risk for fractures or osteoporosis should discuss screening with their health care providers. Ask your health care provider whether you should take a calcium supplement or vitamin D to reduce the rate of osteoporosis.  Menopause can be associated with  physical symptoms and risks. Hormone replacement therapy is available to decrease symptoms and risks. You should talk to your health care provider about whether hormone replacement therapy is right for you.  Use sunscreen. Apply sunscreen liberally and repeatedly throughout the day. You should seek shade when your shadow is shorter than you. Protect yourself by wearing long sleeves, pants, a wide-brimmed hat, and sunglasses year round, whenever you are outdoors.  Once a month, do a whole body skin exam, using a mirror to look at the skin on your back. Tell your health care provider of new moles, moles that have irregular borders, moles that are larger than a pencil eraser, or moles that have changed in shape or color.  Stay current with required vaccines (immunizations).  Influenza vaccine. All adults should  be immunized every year.  Tetanus, diphtheria, and acellular pertussis (Td, Tdap) vaccine. Pregnant women should receive 1 dose of Tdap vaccine during each pregnancy. The dose should be obtained regardless of the length of time since the last dose. Immunization is preferred during the 27th-36th week of gestation. An adult who has not previously received Tdap or who does not know her vaccine status should receive 1 dose of Tdap. This initial dose should be followed by tetanus and diphtheria toxoids (Td) booster doses every 10 years. Adults with an unknown or incomplete history of completing a 3-dose immunization series with Td-containing vaccines should begin or complete a primary immunization series including a Tdap dose. Adults should receive a Td booster every 10 years.    Zoster vaccine. One dose is recommended for adults aged 74 years or older unless certain conditions are present.    Pneumococcal 13-valent conjugate (PCV13) vaccine. When indicated, a person who is uncertain of her immunization history and has no record of immunization should receive the PCV13 vaccine. An adult aged 18  years or older who has certain medical conditions and has not been previously immunized should receive 1 dose of PCV13 vaccine. This PCV13 should be followed with a dose of pneumococcal polysaccharide (PPSV23) vaccine. The PPSV23 vaccine dose should be obtained at least 8 weeks after the dose of PCV13 vaccine. An adult aged 5 years or older who has certain medical conditions and previously received 1 or more doses of PPSV23 vaccine should receive 1 dose of PCV13. The PCV13 vaccine dose should be obtained 1 or more years after the last PPSV23 vaccine dose.    Pneumococcal polysaccharide (PPSV23) vaccine. When PCV13 is also indicated, PCV13 should be obtained first. All adults aged 44 years and older should be immunized. An adult younger than age 64 years who has certain medical conditions should be immunized. Any person who resides in a nursing home or long-term care facility should be immunized. An adult smoker should be immunized. People with an immunocompromised condition and certain other conditions should receive both PCV13 and PPSV23 vaccines. People with human immunodeficiency virus (HIV) infection should be immunized as soon as possible after diagnosis. Immunization during chemotherapy or radiation therapy should be avoided. Routine use of PPSV23 vaccine is not recommended for American Indians, [place] Natives, or people younger than 65 years unless there are medical conditions that require PPSV23 vaccine. When indicated, people who have unknown immunization and have no record of immunization should receive PPSV23 vaccine. One-time revaccination 5 years after the first dose of PPSV23 is recommended for people aged 19-64 years who have chronic kidney failure, nephrotic syndrome, asplenia, or immunocompromised conditions. People who received 1-2 doses of PPSV23 before age 59 years should receive another dose of PPSV23 vaccine at age 71 years or later if at least 5 years have passed since the previous dose.  Doses of PPSV23 are not needed for people immunized with PPSV23 at or after age 33 years.   Preventive Services / Frequency  Ages 39 years and over  Blood pressure check.  Lipid and cholesterol check  Lung cancer screening. / Every year if you are aged 73-80 years and have a 30-pack-year history of smoking and currently smoke or have quit within the past 15 years. Yearly screening is stopped once you have quit smoking for at least 15 years or develop a health problem that would prevent you from having lung cancer treatment.  Clinical breast exam.  BRCA-related cancer risk assessment.** / For women who  have family members with a BRCA-related cancer (breast, ovarian, tubal, or peritoneal cancers).  Mammogram.** / Every year beginning at age 36 years and continuing for as long as you are in good health. Consult with your health care provider.  Pap test.** / Every 3 years starting at age 29 years through age 37 or 72 years with 3 consecutive normal Pap tests. Testing can be stopped between 65 and 70 years with 3 consecutive normal Pap tests and no abnormal Pap or HPV tests in the past 10 years.  Fecal occult blood test (FOBT) of stool. / Every year beginning at age 60 years and continuing until age 39 years. You may not need to do this test if you get a colonoscopy every 10 years.  Flexible sigmoidoscopy or colonoscopy.** / Every 5 years for a flexible sigmoidoscopy or every 10 years for a colonoscopy beginning at age 41 years and continuing until age 36 years.  Hepatitis C blood test.** / For all people born from [birth year] through 1965 and any individual with known risks for hepatitis C.  Osteoporosis screening.** / A one-time screening for women ages 41 years and over and women at risk for fractures or osteoporosis.  Skin self-exam. / Monthly.  Influenza vaccine. / Every year.  Tetanus, diphtheria, and acellular pertussis (Tdap/Td) vaccine.** / 1 dose of Td every 10 years.  Zoster  vaccine.** / 1 dose for adults aged 25 years or older.  Pneumococcal 13-valent conjugate (PCV13) vaccine.** / Consult your health care provider.  Pneumococcal polysaccharide (PPSV23) vaccine.** / 1 dose for all adults aged 60 years and older.

## 2014-01-29 NOTE — Progress Notes (Signed)
Patient ID: [name], female   DOB: 09-01-1945, 68 y.o.   MRN: [id number]  [facility] VISIT AND CPE  Assessment:   1. Essential hypertension  - Microalbumin / creatinine urine ratio - EKG 12-Lead - [name], RETROPERITNL ABD,  LTD  2. Mixed hyperlipidemia  3. 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 Vitamin D deficiency  - Vit D  25 hydroxy (rtn osteoporosis monitoring)  5. Screening for rectal cancer  - POC Hemoccult Bld/Stl (3-Cd Home Screen); Future  6. Gouty arthritis  7. Medication management  - Urine Microscopic - CBC with Differential - BASIC METABOLIC PANEL WITH GFR - Hepatic function panel - Magnesium - TSH  8. Encounter for general adult medical examination with abnormal findings  9. At low risk for fall  10. Depression screen - Negative  11. Idiopathic gout, r/o hyperuricemia  - Uric acid  12. Need for prophylactic vaccination against Streptococcus pneumoniae (pneumococcus)  - Pneumococcal conjugate vaccine 13-valent  13. Need for prophylactic vaccination and inoculation against influenza  - Flu vaccine HIGH DOSE PF (Fluzone Tri High dose)  14. Breast Cancer Left , s/p lumpectomy  - s/p Radiation Therapy w/ primary hormonal chemo therapy with Arimidex  15. ASCAD s/p CABG  16. S/p Bilat Ray Amputations of toes  17. Morbid Obesity (BMI 30+ with multiple co-morbidities)  Plan:   During the course of the visit the patient was educated and counseled about appropriate screening and preventive services including:    Pneumococcal vaccine   Influenza vaccine  Td vaccine  Screening electrocardiogram  Bone densitometry screening  Colorectal cancer screening  Diabetes screening  Glaucoma screening  Nutrition counseling   Advanced directives: requested  Screening recommendations, referrals: Vaccinations: DT vaccine 01/13/2012 Influenza vaccine HD 01/30/2014 Pneumococcal vaccine 01/07/2011 Prevnar vaccine  01/30/2014 Shingles vaccine 01/13/2012 Hep B vaccine not indicated  Nutrition assessed and recommended  Colonoscopy 03/08/2010 Recommended yearly ophthalmology/optometry visit for glaucoma screening and checkup Recommended yearly dental visit for hygiene and checkup Advanced directives - Yes  Conditions/risks identified: BMI: Discussed weight loss, diet, and increase physical activity.  Increase physical activity: AHA recommends 150 minutes of physical activity a week.  Medications reviewed Diabetes is not at goal, ACE/ARB therapy: Yes. Urinary Incontinence is not an issue: discussed non pharmacology and pharmacology options.  Fall risk: low- discussed PT, home fall assessment, medications.   Subjective:   [name] is a 68 y.o. female who presents for Medicare Annual Wellness Visit and complete physical.  Date of last medicare wellness visit is unknown.  She has had elevated blood pressure for many years. Her blood pressure has been controlled at home, & today their BP is BP: 138/76 mmHg. In Feb 2011 she had a NSTEMI and a 2V CABG. Her ABI's also show decreased flow on the left  She does workout. She denies chest pain, shortness of breath, dizziness.  She is on cholesterol medication and denies myalgias. Her cholesterol is at goal, but her Trig are elevated. The cholesterol last visit was:  Lab Results  Component Value Date   CHOL 122 11/09/2013   HDL 26* 11/09/2013   LDLCALC NOT CALC 11/09/2013   TRIG 487* 11/09/2013   CHOLHDL 4.7 11/09/2013   She has Morbid Obesity (BMI 30+) and consequent diabetes for 15 years (2000). She was started on insulin in 2014. She has been alleged working on diet and exercise for Insulin dependent diabetes, and denies foot ulcerations, hyperglycemia, hypoglycemia , nausea, , polydipsia, polyuria and visual disturbances.  Does have occasional paresthesias of the feet. She is s/p Bilat Ray Amp or toes. She is also followed by Dr Cruzita Lederer for her insulin  management. Last A1C in the office was:  Lab Results  Component Value Date   HGBA1C 11.1* 11/09/2013   Patient is on Vitamin D supplement.   Lab Results  Component Value Date   VD25OH 1 09/01/2013      Names of Other Physician/Practitioners you currently use: 1. [facility] here for primary care 2. Dr Bing Plume, eye doctor, last visit 2015 3. Dr Ashok Pall, dentist, last visit 2015  Patient Care Team: Unk Pinto, MD as PCP - General (Internal Medicine) Chauncey Cruel, MD as Consulting Physician (Oncology) Marye Round, MD as Consulting Physician (Radiation Oncology) Newt Minion, MD as Consulting Physician (Orthopedic Surgery) Philemon Kingdom, MD as Consulting Physician (Internal Medicine) Josue Hector, MD as Consulting Physician (Cardiology) Inda Castle, MD as Consulting Physician (Gastroenterology) Rolm Bookbinder, MD as Consulting Physician (General Surgery) Linton Rump, MD as Consulting Physician (Ophthalmology)  Medication Review: Medication Sig  . allopurinol (ZYLOPRIM) 100 MG tablet TAKE 1 TABLET TWICE DAILY  . anastrozole (ARIMIDEX) 1 MG tablet Take 1 tablet (1 mg total) by mouth daily.  Marland Kitchen aspirin 325 MG tablet Take 325 mg by mouth at bedtime.   Marland Kitchen aspirin-acetaminophen-caffeine (EXCEDRIN MIGRAINE) 250-250-65 MG per tablet Take 1-2 tablets by mouth every 6 (six) hours as needed (sinus headache).  . carvedilol (COREG) 12.5 MG tablet Take 1 tablet (12.5 mg total) by mouth 2 (two) times daily with a meal.  . COLCRYS 0.6 MG tablet Take 0.6 mg by mouth daily as needed (for gout flareups).   Marland Kitchen glucose blood test strip Check sugars 3x a day  . hyaluronate sodium (RADIAPLEXRX) GEL Apply 1 application topically 2 (two) times daily. Apply after rad tx  And bedtime daily,not 4 hours prior to rad tx  . hydrochlorothiazide (HYDRODIURIL) 12.5 MG tablet TAKE 1 TABLET EVERY DAY  . insulin glargine (LANTUS) 100 UNIT/ML  injection Inject 0.3 mLs (30 Units total) into the skin every morning.  . insulin lispro (HUMALOG KWIKPEN) 100 UNIT/ML KiwkPen Inject 0.05-0.07 mLs (5-7 Units total) into the skin 3 (three) times daily.  . Insulin Pen Needle 32G X 4 MM MISC Inject 4 times a day  . Melatonin 5 MG TABS Take 5 mg by mouth at bedtime as needed (for sleep).  . metformin (FORTAMET) 1000 MG (OSM) 24 hr tablet Take 1,000 mg by mouth 2 (two) times daily with a meal.  . mupirocin ointment (BACTROBAN) 2 %   . OVER THE COUNTER MEDICATION 2 tablets 1 day or 1 dose. CinSulin (True Nature) Vitamin D, Cinnamon bark and Chromium  . oxyCODONE (OXY IR/ROXICODONE) 5 MG immediate release tablet Take 1 tablet (5 mg total) by mouth every 4 (four) hours as needed for moderate pain.  . Polyvinyl Alcohol-Povidone (REFRESH OP) Place 1 drop into both eyes daily as needed (dry eyes).  . rosuvastatin (CRESTOR) 20 MG tablet Take 20 mg by mouth daily.  . valsartan (DIOVAN) 320 MG tablet TAKE 1 TABLET EVERY MORNING FOR BLOOD PRESSURE   No current facility-administered medications on file prior to visit.    Current Problems (verified) Patient Active Problem List   Diagnosis Date Noted  . 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 Gouty arthritis   . Vitamin D deficiency   . CKD (chronic kidney disease) stage 3, GFR 30-59 ml/min  06/04/2013  . Breast cancer of upper-inner quadrant of left female breast 02/14/2013  . Foot ulcer, left 10/04/2011  . Peripheral vascular disease 09/05/2009  . Hyperlipidemia 06/20/2009  . Essential hypertension 06/20/2009  . Coronary atherosclerosis 06/20/2009  . Congestive heart failure 06/20/2009  . EMPHYSEMA 06/20/2009  . Asthma 06/20/2009    Screening Tests Health Maintenance  Topic Date Due  . Foot Exam  12/18/1955  . Ophthalmology Exam  12/18/1955  . Urine Microalbumin  12/18/1955  . Influenza Vaccine  11/05/2013  . Hemoglobin A1c  05/12/2014  . Mammogram  02/09/2015  . Colonoscopy   03/08/2020  . Tetanus/tdap  01/12/2022  . Pneumococcal Polysaccharide Vaccine Age 67 And Over  Completed  . Zostavax  Completed    Immunization History  Administered Date(s) Administered  . Influenza, High Dose Seasonal PF 01/30/2014  . Influenza-Unspecified 02/05/2013  . Pneumococcal Conjugate-13 01/30/2014  . Pneumococcal Polysaccharide-23 01/07/2011  . Td 01/13/2012  . Zoster 01/13/2012    Preventative care: Last colonoscopy: 03/08/2010  Prior vaccinations: TD : 01/13/2012  Influenza: HD 01/30/2014  Pneumococcal: 01/07/2011 Prevnar: 01/30/2014 Shingles/Zostavax: 01/13/2012  History reviewed: allergies, current medications, past family history, past medical history, past social history, past surgical history and problem list   Risk Factors: Tobacco History  Substance Use Topics  . Smoking status: Never Smoker   . Smokeless tobacco: Never Used  . Alcohol Use: No     Comment: rarely drinks wine   She does not smoke.  Patient is not a former smoker. Are there smokers in your home (other than you)?  No  Alcohol Current alcohol use: social drinker  Caffeine Current caffeine use: coffee 1-2 /day  Exercise Current exercise: walking  Nutrition/Diet Current diet: in general, a "healthy" diet    Cardiac risk factors: advanced age (older than 57 for men, 1 for women), diabetes mellitus, dyslipidemia, hypertension, obesity (BMI >= 30 kg/m2) and sedentary lifestyle.  Depression Screen (Note: if answer to either of the following is "Yes", a more complete depression screening is indicated)   Q1: Over the past two weeks, have you felt down, depressed or hopeless? No  Q2: Over the past two weeks, have you felt little interest or pleasure in doing things? No  Have you lost interest or pleasure in daily life? No  Do you often feel hopeless? No  Do you cry easily over simple problems? No  Activities of Daily Living In your present state of health, do you have any difficulty  performing the following activities?:  Driving? No Managing money?  No Feeding yourself? No Getting from bed to chair? No Climbing a flight of stairs? No Preparing food and eating?: No Bathing or showering? No Getting dressed: No Getting to the toilet? No Using the toilet:No Moving around from place to place: No In the past year have you fallen or had a near fall?:No   Are you sexually active?  Yes  Do you have more than one partner?  No  Vision Difficulties: No  Hearing Difficulties: No Do you often ask people to speak up or repeat themselves? No Do you experience ringing or noises in your ears? No Do you have difficulty understanding soft or whispered voices? Sometimes.  Cognition  Do you feel that you have a problem with memory?No  Do you often misplace items? No  Do you feel safe at home?  Yes  Advanced directives Does patient have a [place]? Yes Does patient have a Living Will? Yes  Objective:  BP 138/76, P 80, T 98.1 F , R 18, Ht 5\' 5"  , Wt 181 lb  BMI is 30.12  General appearance: Older obese white female. alert, no distress. Cognitive Testing  Alert? Yes  Normal Appearance?Yes  Oriented to person? Yes  Place? Yes   Time? Yes  Recall of three objects?  Yes  Can perform simple calculations? Yes  Displays appropriate judgment? Yes  Can read the correct time from a watch/clock?Yes  HEENT: normocephalic, sclerae anicteric, TMs pearly, nares patent, no discharge or erythema, pharynx normal Oral cavity: MMM, no lesions Neck: supple, no lymphadenopathy, no thyromegaly, no masses Heart: RRR, normal S1, S2, no murmurs Lungs: CTA bilaterally, no wheezes, rhonchi, or rales Breasts: No masses. Abdomen: soft, non tender, non distended, no masses, no hepatomegaly, no splenomegaly Musculoskeletal: nontender, no swelling, no obvious deformity Extremities: no edema, no cyanosis, no clubbing Pulses: 2+ symmetric, upper and  1+to 0 in lower  extremities, decr cap refill Neurological: alert, oriented x 3, CN2-12 intact, strength normal upper extremities and lower extremities, sensation in a stocking distribution.DTRs 1+ in UE's & absent in LE's, no cerebellar signs, gait normal Psychiatric: normal affect, behavior normal, pleasant   Medicare Attestation I have personally reviewed: The patient's medical and social history Their use of alcohol, tobacco or illicit drugs Their current medications and supplements The patient's functional ability including ADLs,fall risks, home safety risks, cognitive, and hearing and visual impairment Diet and physical activities Evidence for depression or mood disorders  The patient's weight, height, BMI, and visual acuity have been recorded in the chart.  I have made referrals, counseling, and provided education to the patient based on review of the above and I have provided the patient with a written personalized care plan for preventive services.    Hadia Minier DAVID, MD   01/30/2014

## 2014-01-30 ENCOUNTER — Encounter: Payer: Self-pay | Admitting: Internal Medicine

## 2014-01-30 ENCOUNTER — Ambulatory Visit (INDEPENDENT_AMBULATORY_CARE_PROVIDER_SITE_OTHER): Payer: Medicare Other | Admitting: Internal Medicine

## 2014-01-30 VITALS — BP 138/76 | HR 80 | Temp 98.1°F | Resp 18 | Ht 65.0 in | Wt 181.0 lb

## 2014-01-30 DIAGNOSIS — M1000 Idiopathic gout, unspecified site: Secondary | ICD-10-CM | POA: Diagnosis not present

## 2014-01-30 DIAGNOSIS — E559 Vitamin D deficiency, unspecified: Secondary | ICD-10-CM

## 2014-01-30 DIAGNOSIS — I10 Essential (primary) hypertension: Secondary | ICD-10-CM | POA: Diagnosis not present

## 2014-01-30 DIAGNOSIS — E782 Mixed hyperlipidemia: Secondary | ICD-10-CM

## 2014-01-30 DIAGNOSIS — Z23 Encounter for immunization: Secondary | ICD-10-CM | POA: Diagnosis not present

## 2014-01-30 DIAGNOSIS — E1029 Type 1 diabetes mellitus with other diabetic kidney complication: Secondary | ICD-10-CM | POA: Diagnosis not present

## 2014-01-30 DIAGNOSIS — Z1331 Encounter for screening for depression: Secondary | ICD-10-CM

## 2014-01-30 DIAGNOSIS — Z1212 Encounter for screening for malignant neoplasm of rectum: Secondary | ICD-10-CM

## 2014-01-30 DIAGNOSIS — R6889 Other general symptoms and signs: Secondary | ICD-10-CM

## 2014-01-30 DIAGNOSIS — M109 Gout, unspecified: Secondary | ICD-10-CM | POA: Diagnosis not present

## 2014-01-30 DIAGNOSIS — Z79899 Other long term (current) drug therapy: Secondary | ICD-10-CM | POA: Diagnosis not present

## 2014-01-30 DIAGNOSIS — Z9181 History of falling: Secondary | ICD-10-CM

## 2014-01-30 DIAGNOSIS — Z0001 Encounter for general adult medical examination with abnormal findings: Secondary | ICD-10-CM

## 2014-01-30 LAB — CBC WITH DIFFERENTIAL/PLATELET
Basophils Absolute: 0 10*3/uL (ref 0.0–0.1)
Basophils Relative: 0 % (ref 0–1)
EOS ABS: 0.3 10*3/uL (ref 0.0–0.7)
Eosinophils Relative: 4 % (ref 0–5)
HEMATOCRIT: 41 % (ref 36.0–46.0)
HEMOGLOBIN: 13.7 g/dL (ref 12.0–15.0)
LYMPHS ABS: 1.7 10*3/uL (ref 0.7–4.0)
Lymphocytes Relative: 24 % (ref 12–46)
MCH: 28.9 pg (ref 26.0–34.0)
MCHC: 33.4 g/dL (ref 30.0–36.0)
MCV: 86.5 fL (ref 78.0–100.0)
MONO ABS: 0.5 10*3/uL (ref 0.1–1.0)
MONOS PCT: 7 % (ref 3–12)
Neutro Abs: 4.5 10*3/uL (ref 1.7–7.7)
Neutrophils Relative %: 65 % (ref 43–77)
Platelets: 229 10*3/uL (ref 150–400)
RBC: 4.74 MIL/uL (ref 3.87–5.11)
RDW: 14.5 % (ref 11.5–15.5)
WBC: 6.9 10*3/uL (ref 4.0–10.5)

## 2014-01-30 LAB — MAGNESIUM: MAGNESIUM: 2.1 mg/dL (ref 1.5–2.5)

## 2014-01-30 LAB — HEPATIC FUNCTION PANEL
ALT: 24 U/L (ref 0–35)
AST: 21 U/L (ref 0–37)
Albumin: 4.3 g/dL (ref 3.5–5.2)
Alkaline Phosphatase: 68 U/L (ref 39–117)
BILIRUBIN DIRECT: 0.1 mg/dL (ref 0.0–0.3)
Indirect Bilirubin: 0.3 mg/dL (ref 0.2–1.2)
Total Bilirubin: 0.4 mg/dL (ref 0.2–1.2)
Total Protein: 6.9 g/dL (ref 6.0–8.3)

## 2014-01-30 LAB — BASIC METABOLIC PANEL WITH GFR
BUN: 48 mg/dL — ABNORMAL HIGH (ref 6–23)
CALCIUM: 10.8 mg/dL — AB (ref 8.4–10.5)
CO2: 24 meq/L (ref 19–32)
CREATININE: 1.61 mg/dL — AB (ref 0.50–1.10)
Chloride: 100 mEq/L (ref 96–112)
GFR, Est African American: 38 mL/min — ABNORMAL LOW
GFR, Est Non African American: 33 mL/min — ABNORMAL LOW
GLUCOSE: 235 mg/dL — AB (ref 70–99)
Potassium: 4.8 mEq/L (ref 3.5–5.3)
SODIUM: 137 meq/L (ref 135–145)

## 2014-01-30 LAB — TSH: TSH: 1.931 u[IU]/mL (ref 0.350–4.500)

## 2014-01-30 LAB — URIC ACID: Uric Acid, Serum: 6 mg/dL (ref 2.4–7.0)

## 2014-01-31 ENCOUNTER — Telehealth: Payer: Self-pay | Admitting: *Deleted

## 2014-01-31 LAB — VITAMIN D 25 HYDROXY (VIT D DEFICIENCY, FRACTURES): Vit D, 25-Hydroxy: 48 ng/mL (ref 30–89)

## 2014-01-31 LAB — MICROALBUMIN / CREATININE URINE RATIO
Creatinine, Urine: 41.9 mg/dL
MICROALB UR: 135.4 mg/dL — AB (ref <2.0)
Microalb Creat Ratio: 3231.5 mg/g — ABNORMAL HIGH (ref 0.0–30.0)

## 2014-01-31 LAB — URINALYSIS, MICROSCOPIC ONLY
Bacteria, UA: NONE SEEN
CASTS: NONE SEEN
CRYSTALS: NONE SEEN
Squamous Epithelial / LPF: NONE SEEN

## 2014-01-31 NOTE — Telephone Encounter (Signed)
Pt called stating that her blood sugars have been running high since stopping the glimepiride and decreasing her Lantus.  Wed: 130 AM; 174 at 3:00 pm Thurs: 134 AM; 261 at 10 pm Fri: 218 AM; 249 before lunch Sat: 159 AM; 163 at 2:30 All readings since in the 200's This AM 220 Pt had this for dinner last night: steamed chinese veggies, 1/4 cup of rice; cannellini beans with a olive and onion relish Pt said she had her yearly physical yesterday and her PCP gave her a book End of Diabetes. She plans to try the food choices and meals in there.  Pt wants to know if she can start back on glimepiride 2mg ? Please advise.

## 2014-01-31 NOTE — Telephone Encounter (Signed)
No, let's go ahead and increase her insulin instead: Lantus to 30 >> 35 units at bedtime.  Humalog 15 minutes before meals as follows:  - 5 >> 7 units with a smaller meal  - 6 >> 8 units with a regular meal  - 7 >> 10 units with a larger meal.  Continue Metformin XR 1000 mg 2x a day with meals.  Please call in 2-3 days to see if this helped.

## 2014-01-31 NOTE — Telephone Encounter (Signed)
Called pt and advised her per Dr Gherghe's note. Pt understood.  

## 2014-02-02 ENCOUNTER — Other Ambulatory Visit: Payer: Self-pay | Admitting: Internal Medicine

## 2014-02-13 DIAGNOSIS — L97514 Non-pressure chronic ulcer of other part of right foot with necrosis of bone: Secondary | ICD-10-CM | POA: Diagnosis not present

## 2014-02-13 DIAGNOSIS — M86171 Other acute osteomyelitis, right ankle and foot: Secondary | ICD-10-CM | POA: Diagnosis not present

## 2014-02-13 DIAGNOSIS — E1142 Type 2 diabetes mellitus with diabetic polyneuropathy: Secondary | ICD-10-CM | POA: Diagnosis not present

## 2014-02-16 ENCOUNTER — Encounter: Payer: Self-pay | Admitting: Physician Assistant

## 2014-02-16 ENCOUNTER — Ambulatory Visit: Payer: Medicare Other | Admitting: Physician Assistant

## 2014-02-16 VITALS — BP 124/80 | HR 80 | Temp 98.4°F | Resp 16 | Ht 65.0 in | Wt 176.0 lb

## 2014-02-16 DIAGNOSIS — IMO0001 Reserved for inherently not codable concepts without codable children: Secondary | ICD-10-CM

## 2014-02-16 NOTE — Progress Notes (Signed)
Patient was referred to Dr. Philemon Kingdom (endocrinologist).  Has been seeing her since 12/14/13 for diabetes.  Due to pt having Medicare, Dr. Philemon Kingdom has to be the one to evaluate for diabetic shoes due to her managing the pt's diabetes.  Andruw Battie, Stephani Police, PA-C 10:28 AM [facility]

## 2014-02-17 ENCOUNTER — Encounter: Payer: Self-pay | Admitting: Internal Medicine

## 2014-02-17 ENCOUNTER — Ambulatory Visit (INDEPENDENT_AMBULATORY_CARE_PROVIDER_SITE_OTHER): Payer: Medicare Other | Admitting: Internal Medicine

## 2014-02-17 ENCOUNTER — Telehealth: Payer: Self-pay | Admitting: Internal Medicine

## 2014-02-17 VITALS — BP 122/60 | HR 76 | Temp 98.1°F | Resp 12 | Wt 175.0 lb

## 2014-02-17 DIAGNOSIS — IMO0002 Reserved for concepts with insufficient information to code with codable children: Secondary | ICD-10-CM

## 2014-02-17 DIAGNOSIS — E1141 Type 2 diabetes mellitus with diabetic mononeuropathy: Secondary | ICD-10-CM | POA: Diagnosis not present

## 2014-02-17 DIAGNOSIS — E1165 Type 2 diabetes mellitus with hyperglycemia: Principal | ICD-10-CM

## 2014-02-17 DIAGNOSIS — I2510 Atherosclerotic heart disease of native coronary artery without angina pectoris: Secondary | ICD-10-CM

## 2014-02-17 DIAGNOSIS — E1149 Type 2 diabetes mellitus with other diabetic neurological complication: Secondary | ICD-10-CM

## 2014-02-17 DIAGNOSIS — E1140 Type 2 diabetes mellitus with diabetic neuropathy, unspecified: Secondary | ICD-10-CM | POA: Insufficient documentation

## 2014-02-17 LAB — HEMOGLOBIN A1C: Hgb A1c MFr Bld: 9 % — ABNORMAL HIGH (ref 4.6–6.5)

## 2014-02-17 NOTE — Telephone Encounter (Signed)
Last office note faxed to [facility] at [phone].

## 2014-02-17 NOTE — Telephone Encounter (Signed)
Please fax office visit note once completed to [facility] F# [id number] office number is [phone]

## 2014-02-17 NOTE — Patient Instructions (Addendum)
Please increase Lantus to 35 units at bedtime. Continue Humalog 15 minutes before meals as follows: - 5 units with a smaller meal  - 6 units with a regular meal  - 7 units with a larger meal. Continue Metformin XR 1000 mg 2x a day with meals for now. Please call me for sugars consistently <80 or >200. Please return in 3 months with your sugar log.  Please stop at the lab.

## 2014-02-17 NOTE — Progress Notes (Signed)
Patient ID: [name], female   DOB: 06/24/1945, 68 y.o.   MRN: [id number]  HPI: [name] is a 68 y.o.-year-old female, returning for f/u for DM2, dx 2014, insulin-dependent since 2015, uncontrolled, with complications (CAD, PAD, CKD stage 3, h/o foot ulcer, PN). Last visit 1.5 mo ago.  Last hemoglobin A1c was: Lab Results  Component Value Date   HGBA1C 11.1* 11/09/2013   HGBA1C 10.5* 06/04/2013   HGBA1C 10.8* 10/04/2011   Pt is on a regimen of: - Lantus 30- 35 units qam - Metformin XR 1000 mg bid - Humalog 15 minutes before meals as follows - started 12/2013: - 5 units with a smaller meal - 6 units with a regular meal - 7 units with a larger meal. Continue Metformin XR 1000 mg 2x a day with meals for now. We stopped Glimepiride 4 mg bid, but she restarted as sugars were still high. She was on Actos, Januvia.  Pt checks her sugars once a day and they are much better, after changing her diet to most vegetables and fruit: - am: high 100s-low 200s >> 75, 82, 107-256, lower after Doxycycline >> 82, 101-138 on the new diet, some 200s if not keeping a diet - 2h after b'fast: n/c - before lunch: n/c >> 87-132 >> 120-140 - 2h after lunch: n/c >> 134-189 - before dinner:n/c >> 108-209 - 2h after dinner: n/c  - bedtime: n/c - nighttime: n/c No lows. Lowest sugar was 82.  she has hypoglycemia awareness at ~70.  Highest sugar was 209.  Pt's meals are: - Breakfast: toast, eggs, omelet, cereal with fruit - Lunch: sandwich or salad or leftovers - Dinner: meat + 2 veggies or salad - Snacks: 1 - varies: nabs, veggies + humus, apple + PB, cheese She is on Arimidex >> craves sugars.   She exercises after dinner.  - + CKD, last BUN/creatinine:  Lab Results  Component Value Date   BUN 48* 01/30/2014   CREATININE 1.61* 01/30/2014  On Valsartan - last set of lipids: Lab Results  Component Value Date   CHOL 122 11/09/2013   HDL 26* 11/09/2013   LDLCALC NOT CALC 11/09/2013   TRIG 487* 11/09/2013   CHOLHDL 4.7 11/09/2013  On Crestor. - last eye exam was in fall/2014. No DR - reportedly. Had cataract surgery OU. - + numbness and tingling in her feet. She is seeing a orthopedic doctor for her foot care, especially since she is high risk - has ulcerations, calluses, and digit amputations.  She has a h/o BrCa and had surgery in 04/2013, then radiation tx up to 07/2013. During this period, she was overwhelmed >> did not check sugars, but did take her meds.  ROS: Constitutional: no weight gain/loss, no fatigue, no subjective hyperthermia/hypothermia Eyes: no blurry vision, no xerophthalmia ENT: no sore throat, no nodules palpated in throat, no dysphagia/odynophagia, no hoarseness Cardiovascular: no CP/SOB/palpitations/leg swelling Respiratory: no cough/SOB Gastrointestinal: no N/V/D/C Musculoskeletal: no muscle/joint aches Skin: no rashes Neurological: no tremors/numbness/tingling/dizziness  I reviewed pt's medications, allergies, PMH, social hx, family hx and no changes required, except as mentioned above.  PE: BP 122/60 mmHg  Pulse 76  Temp(Src) 98.1 F (36.7 C) (Oral)  Resp 12  Wt 175 lb (79.379 kg)  SpO2 98% Wt Readings from Last 3 Encounters:  02/17/14 175 lb (79.379 kg)  02/16/14 176 lb (79.833 kg)  01/30/14 181 lb (82.101 kg)   Constitutional: overweight, in NAD Eyes: PERRLA, EOMI, no exophthalmos ENT: moist mucous membranes, no thyromegaly, no cervical  lymphadenopathy Cardiovascular: RRR, No MRG Respiratory: CTA B Gastrointestinal: abdomen soft, NT, ND, BS+ Musculoskeletal: no deformities, strength intact in all 4 Skin: moist, warm, no rashes Neurological: no tremor with outstretched hands, DTR normal in all 4 Foot exam: today  ASSESSMENT: 1. DM2, insulin-dependent, uncontrolled, with complications - CAD - h/o AMi, h/o CABG 2011 - PAD - CKD stage 3 - h/o foot ulcer - PN  PLAN:  1. Patient with long-standing, uncontrolled diabetes,  on oral antidiabetic regimen + basal insulin. - We discussed about options for treatment, and I suggested to:  Patient Instructions  Please increase Lantus to 35 units at bedtime. Continue Humalog 15 minutes before meals as follows: - 5 units with a smaller meal  - 6 units with a regular meal  - 7 units with a larger meal. Continue Metformin XR 1000 mg 2x a day with meals for now. Please call me for sugars consistently <80 or >200. Please return in 3 months with your sugar log.  Please stop at the lab. - had the flu shot this season - Strongly advised her to start checking sugars at different times of the day - check 2-3 times a day, rotating checks - advised for yearly eye exams >> she is UTD - Return to clinic in 3 mo with sugar log   A detailed foot exam form was filled out at this visit for her diabetic shoes.  - time spent with the patient: 40 min, of which >50% was spent in reviewing her sugars and diabetes regimen and also performing the detailed foot exam and filling her foot exam form. We will scan this.  Office Visit on 02/17/2014  Component Date Value Ref Range Status  . Hgb A1c MFr Bld 02/17/2014 9.0* 4.6 - 6.5 % Final   Glycemic Control Guidelines for People with Diabetes:Non Diabetic:  <6%Goal of Therapy: <7%Additional Action Suggested:  >8%    Hemoglobin A1c much better!

## 2014-02-22 ENCOUNTER — Telehealth: Payer: Self-pay | Admitting: Internal Medicine

## 2014-02-22 NOTE — Telephone Encounter (Signed)
Pt needs a rx faxed to [employer] for the orthotics to go in the shoes that her foot MD wrote the rx for. Please fax the rx to [phone] for the custom molded inserts. Call the pt to let her know when this is done.

## 2014-02-22 NOTE — Telephone Encounter (Signed)
Please read note below and advise.  

## 2014-02-23 ENCOUNTER — Other Ambulatory Visit: Payer: Self-pay | Admitting: Internal Medicine

## 2014-02-23 MED ORDER — MISC. DEVICES MISC
Status: DC
Start: 2014-02-23 — End: 2017-03-05

## 2014-02-23 NOTE — Telephone Encounter (Signed)
Printed

## 2014-02-23 NOTE — Telephone Encounter (Signed)
Faxed to [employer] at [phone].

## 2014-03-04 ENCOUNTER — Encounter: Payer: Self-pay | Admitting: *Deleted

## 2014-03-07 ENCOUNTER — Ambulatory Visit: Payer: Medicare Other | Admitting: Internal Medicine

## 2014-03-13 DIAGNOSIS — E1142 Type 2 diabetes mellitus with diabetic polyneuropathy: Secondary | ICD-10-CM | POA: Diagnosis not present

## 2014-03-13 DIAGNOSIS — L97514 Non-pressure chronic ulcer of other part of right foot with necrosis of bone: Secondary | ICD-10-CM | POA: Diagnosis not present

## 2014-03-13 DIAGNOSIS — M24673 Ankylosis, unspecified ankle: Secondary | ICD-10-CM | POA: Diagnosis not present

## 2014-04-24 ENCOUNTER — Other Ambulatory Visit: Payer: Self-pay | Admitting: Internal Medicine

## 2014-05-02 ENCOUNTER — Ambulatory Visit: Payer: Medicare Other | Admitting: Cardiovascular Disease

## 2014-05-08 ENCOUNTER — Other Ambulatory Visit: Payer: Self-pay | Admitting: Internal Medicine

## 2014-05-18 DIAGNOSIS — E11321 Type 2 diabetes mellitus with mild nonproliferative diabetic retinopathy with macular edema: Secondary | ICD-10-CM | POA: Diagnosis not present

## 2014-05-18 DIAGNOSIS — H04123 Dry eye syndrome of bilateral lacrimal glands: Secondary | ICD-10-CM | POA: Diagnosis not present

## 2014-05-22 ENCOUNTER — Other Ambulatory Visit: Payer: Self-pay | Admitting: Oncology

## 2014-05-22 ENCOUNTER — Encounter: Payer: Self-pay | Admitting: *Deleted

## 2014-05-22 DIAGNOSIS — Z853 Personal history of malignant neoplasm of breast: Secondary | ICD-10-CM

## 2014-06-13 DIAGNOSIS — Z853 Personal history of malignant neoplasm of breast: Secondary | ICD-10-CM | POA: Diagnosis not present

## 2014-06-13 DIAGNOSIS — R928 Other abnormal and inconclusive findings on diagnostic imaging of breast: Secondary | ICD-10-CM | POA: Diagnosis not present

## 2014-06-13 DIAGNOSIS — N6001 Solitary cyst of right breast: Secondary | ICD-10-CM | POA: Diagnosis not present

## 2014-06-21 ENCOUNTER — Other Ambulatory Visit: Payer: Self-pay | Admitting: *Deleted

## 2014-06-21 DIAGNOSIS — C50212 Malignant neoplasm of upper-inner quadrant of left female breast: Secondary | ICD-10-CM

## 2014-06-22 ENCOUNTER — Other Ambulatory Visit (HOSPITAL_BASED_OUTPATIENT_CLINIC_OR_DEPARTMENT_OTHER): Payer: Medicare Other

## 2014-06-22 DIAGNOSIS — C50212 Malignant neoplasm of upper-inner quadrant of left female breast: Secondary | ICD-10-CM

## 2014-06-22 LAB — COMPREHENSIVE METABOLIC PANEL (CC13)
ALK PHOS: 87 U/L (ref 40–150)
ALT: 19 U/L (ref 0–55)
AST: 16 U/L (ref 5–34)
Albumin: 3.4 g/dL — ABNORMAL LOW (ref 3.5–5.0)
Anion Gap: 10 mEq/L (ref 3–11)
BUN: 33.7 mg/dL — ABNORMAL HIGH (ref 7.0–26.0)
CO2: 28 mEq/L (ref 22–29)
CREATININE: 1.8 mg/dL — AB (ref 0.6–1.1)
Calcium: 10.2 mg/dL (ref 8.4–10.4)
Chloride: 103 mEq/L (ref 98–109)
EGFR: 29 mL/min/{1.73_m2} — ABNORMAL LOW (ref >90)
Glucose: 170 mg/dl — ABNORMAL HIGH (ref 70–140)
Potassium: 4.5 mEq/L (ref 3.5–5.1)
Sodium: 140 mEq/L (ref 136–145)
Total Bilirubin: 0.38 mg/dL (ref 0.20–1.20)
Total Protein: 6.7 g/dL (ref 6.4–8.3)

## 2014-06-22 LAB — CBC WITH DIFFERENTIAL/PLATELET
BASO%: 0.1 % (ref 0.0–2.0)
Basophils Absolute: 0 10*3/uL (ref 0.0–0.1)
EOS ABS: 0.2 10*3/uL (ref 0.0–0.5)
EOS%: 2.1 % (ref 0.0–7.0)
HCT: 39.5 % (ref 34.8–46.6)
HGB: 13 g/dL (ref 11.6–15.9)
LYMPH%: 21.6 % (ref 14.0–49.7)
MCH: 29.5 pg (ref 25.1–34.0)
MCHC: 32.9 g/dL (ref 31.5–36.0)
MCV: 89.8 fL (ref 79.5–101.0)
MONO#: 0.8 10*3/uL (ref 0.1–0.9)
MONO%: 10.3 % (ref 0.0–14.0)
NEUT%: 65.9 % (ref 38.4–76.8)
NEUTROS ABS: 5 10*3/uL (ref 1.5–6.5)
Platelets: 226 10*3/uL (ref 145–400)
RBC: 4.4 10*6/uL (ref 3.70–5.45)
RDW: 14.1 % (ref 11.2–14.5)
WBC: 7.6 10*3/uL (ref 3.9–10.3)
lymph#: 1.7 10*3/uL (ref 0.9–3.3)

## 2014-06-27 DIAGNOSIS — L97511 Non-pressure chronic ulcer of other part of right foot limited to breakdown of skin: Secondary | ICD-10-CM | POA: Diagnosis not present

## 2014-06-27 DIAGNOSIS — E11621 Type 2 diabetes mellitus with foot ulcer: Secondary | ICD-10-CM | POA: Diagnosis not present

## 2014-06-29 ENCOUNTER — Telehealth: Payer: Self-pay | Admitting: Oncology

## 2014-06-29 ENCOUNTER — Ambulatory Visit: Payer: Medicare Other | Admitting: Oncology

## 2014-06-29 NOTE — Telephone Encounter (Signed)
pt cld left voiemail no show today-wanted to r/s-cld & spoke to pt and gave pt r/s time & date

## 2014-06-30 ENCOUNTER — Other Ambulatory Visit: Payer: Self-pay | Admitting: Internal Medicine

## 2014-07-04 DIAGNOSIS — L97511 Non-pressure chronic ulcer of other part of right foot limited to breakdown of skin: Secondary | ICD-10-CM | POA: Diagnosis not present

## 2014-07-04 DIAGNOSIS — E11621 Type 2 diabetes mellitus with foot ulcer: Secondary | ICD-10-CM | POA: Diagnosis not present

## 2014-07-10 ENCOUNTER — Encounter: Payer: Self-pay | Admitting: Oncology

## 2014-07-17 DIAGNOSIS — E11621 Type 2 diabetes mellitus with foot ulcer: Secondary | ICD-10-CM | POA: Diagnosis not present

## 2014-07-17 DIAGNOSIS — L97511 Non-pressure chronic ulcer of other part of right foot limited to breakdown of skin: Secondary | ICD-10-CM | POA: Diagnosis not present

## 2014-07-20 ENCOUNTER — Other Ambulatory Visit: Payer: Self-pay | Admitting: *Deleted

## 2014-07-20 MED ORDER — INSULIN PEN NEEDLE 32G X 4 MM MISC: Status: DC

## 2014-07-20 MED ORDER — ROSUVASTATIN CALCIUM 20 MG PO TABS: 20.0000 mg | ORAL_TABLET | Freq: Every day | ORAL | Status: DC

## 2014-07-21 ENCOUNTER — Ambulatory Visit (HOSPITAL_BASED_OUTPATIENT_CLINIC_OR_DEPARTMENT_OTHER): Payer: Medicare Other | Admitting: Nurse Practitioner

## 2014-07-21 ENCOUNTER — Telehealth: Payer: Self-pay | Admitting: Nurse Practitioner

## 2014-07-21 ENCOUNTER — Encounter: Payer: Self-pay | Admitting: Nurse Practitioner

## 2014-07-21 VITALS — BP 172/71 | HR 84 | Temp 98.3°F | Resp 18 | Ht 65.0 in | Wt 178.7 lb

## 2014-07-21 DIAGNOSIS — Z170 Estrogen receptor positive status [ER+]: Secondary | ICD-10-CM

## 2014-07-21 DIAGNOSIS — N898 Other specified noninflammatory disorders of vagina: Secondary | ICD-10-CM

## 2014-07-21 DIAGNOSIS — C50212 Malignant neoplasm of upper-inner quadrant of left female breast: Secondary | ICD-10-CM

## 2014-07-21 NOTE — Progress Notes (Signed)
ID: [name] OB: 25-Dec-1945  MR#: [id number]  CSN#:[id number]  PCP: Pamela Richards, MD GYN:   SU: [name] OTHER MD: [name], [name], [name]  CHIEF COMPLAINT: Estrogen receptor positive early breast cancer   CURRENT TREATMENT: Anastrozole    BREAST CANCER HISTORY:  From the original intake note:   [name] (also goes by [name]") had bilateral screening mammography at [facility] 02/02/2013. There was a 3 mm nodule in the left breast which was further evaluated November 4, confirming an irregular mass at the 1:00 position of the left breast, not confirmed by ultrasonography. Biopsy of this mass 02/10/2013 showed an invasive ductal carcinoma, grade 1, with a prognostic panel pending. Breast MRI has been scheduled for 02/24/2013  The patient's subsequent history is as detailed below  INTERVAL HISTORY: [name] returns today for follow up of her breast cancer. She has been on anastrozole June 2015. She continues to tolerating this drug well. She denies hot flashes, but has significant vaginal dryness. She states that she and her husband are no longer sexually active, and that this is not a true issue. She has some join aches here and there, but they are no worse than before.  REVIEW OF SYSTEMS: [name] denies fevers, chills, nausea, vomiting, or changes in bowel or bladder habits. She has no shortness of breath, chest pain, cough, or palpitations. She recently had oral surgery and is still on the antibiotics for this. She uses melatonin to sleep. Her blood sugars are poorly controlled, but her A1c is trending downward. She has bilateral foot ulcers that are healing since she has visited a podiatrist. She applies bactroban gel to the open ulcers to her left foot. She is waiting for custom orthotics shoes to be made. A detailed review of systems is otherwise stable.  PAST MEDICAL HISTORY: Past Medical History  Diagnosis Date  . Hypertension   . Coronary artery disease      NSTEMI in 05/2009;  LHC with 2 v CAD - > CABG (LIMA-LAD, SVG-diagonal, SVG-OM1/OM 2).   . Hyperlipidemia   . Ischemic cardiomyopathy     Echo 07/2009: Mild LVH, EF 40-45%, inferior and posterior HK, grade 1 diastolic dysfunction, moderate MAC, mild to moderate MR, mild LAE  . PAD (peripheral artery disease)   . Family history of anesthesia complication     Mom has a hard time to wake up  . Neuropathy     Hx; of B/L feet  . Type II diabetes mellitus   . Foot ulcer     "I've had them on both feet" (08/27/2012)  . Osteomyelitis of foot   . Sinus headache     "occasionally" (08/27/2012)  . CKD (chronic kidney disease), stage I   . 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 Allergy   . NSTEMI (non-ST elevated myocardial infarction) 05/23/2009    Archie Endo 05/23/2009 (08/27/2012)  . History of radiation therapy 06/09/13-07/06/13    left breast 50Gy  . Type II or unspecified type diabetes mellitus without mention of complication, not stated as uncontrolled   . Arthritis     "maybe in my lower back" (08/27/2012)  . Gouty arthritis     "right index finger" (08/27/2012)  . Vitamin D deficiency   . Type II diabetes mellitus with neurological manifestations, uncontrolled 02/17/2014    PAST SURGICAL HISTORY: Past Surgical History  Procedure Laterality Date  . Cataract extraction w/ intraocular lens  implant, bilateral  2000's  . Finger surgery  Right     "index finger; turned out to be gout" (08/27/2012)  . Coronary artery bypass graft  2011    "CABG X4" (08/27/2012)  . Dental surgery  04/30/11    "1 implant" (08/27/2012)  . Amputation ray Right 08/27/2012    5th ray/notes 08/27/2012  . Cardiac catheterization  05/24/2009    Archie Endo 05/24/2009 (08/27/2012)  . Amputation Right 08/27/2012    Procedure: AMPUTATION RAY;  Surgeon: Newt Minion, MD;  Location: [place];  Service: Orthopedics;  Laterality: Right;  Right Foot 5th Ray Amputation  . Eye surgery    . Colonoscopy  w/ biopsies and polypectomy  2010  . Breast lumpectomy  04/20/2013    with biopsy      DR WAKEFIELD  . Breast lumpectomy with needle localization and axillary sentinel lymph node bx Left 04/20/2013    Procedure: LEFT BREAST WIRE GUIDED LUMPECTOMY AND AXILLARY SENTINEL LYMPH NODE BX;  Surgeon: Pamela Bookbinder, MD;  Location: [place];  Service: General;  Laterality: Left;  . Dilation and curettage of uterus    . Amputation Left 07/08/2013    Procedure: AMPUTATION RAY;  Surgeon: Newt Minion, MD;  Location: [place];  Service: Orthopedics;  Laterality: Left;  Left Foot 2nd Ray Amputation    FAMILY HISTORY Family History  Problem Relation Age of Onset  . Kidney cancer Brother 47  . Hypertension Brother   . Breast cancer Sister 90    LCIS; BRCA negative  . Throat cancer Father 30    smoker  . Breast cancer Maternal Aunt     dx in her 43s  . Breast cancer Sister 6    Lobular breast cancer   patient's father died at the age of 104 from throat cancer. The patient's mother died at the age of 52 from "many medical problems". The patient had 4 brothers, 3 sisters. One brother had kidney cancer diagnosed at age 62. One sister was diagnosed with breast cancer at age 65 and died October 11, 2012. A second sister was diagnosed with breast cancer at age 49. There is also one of the patient's mother's 5 sisters diagnosed with breast cancer remotely.  GYNECOLOGIC HISTORY:  Menarche age 38 am first live birth age 32, the patient is [place] P79. (She had a twin pregnancy, [name] and [name], delivered prematurely, with the babies dying after a half day). She underwent menopause in her late 49s and used hormone replacement for a few months.  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 Son [name] lives in [place] where he works as a Proofreader. The patient has 3 grandchildren. She attends first [employer] in  [place]: In place   HEALTH MAINTENANCE: History  Substance Use Topics  . Smoking status: Never Smoker   . Smokeless tobacco: Never Used  . Alcohol Use: No     Comment: rarely drinks wine     Colonoscopy: 2010  PAP: 2010  Bone density: 10/21/[facility], normal  Lipid panel:  Allergies  Allergen Reactions  . Atenolol Other (See Comments)    Exacerbates gout  . Contrast Media [Iodinated Diagnostic Agents] Rash  . Latex Rash  . Omnipaque [Iohexol] Rash    Current Outpatient Prescriptions  Medication Sig Dispense Refill  . allopurinol (ZYLOPRIM) 100 MG tablet TAKE 1 TABLET TWICE DAILY 180 tablet 1  . anastrozole (ARIMIDEX) 1 MG tablet  TAKE 1 TABLET EVERY DAY 90 tablet 3  . aspirin 325 MG tablet Take 325 mg by mouth at bedtime.     . carvedilol (COREG) 12.5 MG tablet TAKE 1 TABLET TWICE DAILY FOR BLOOD PRESSURE 180 tablet 3  . glucose blood test strip Check sugars 3x a day 200 each 11  . hydrochlorothiazide (HYDRODIURIL) 12.5 MG tablet TAKE 1 TABLET EVERY DAY 90 tablet 1  . insulin lispro (HUMALOG KWIKPEN) 100 UNIT/ML KiwkPen Inject 0.05-0.07 mLs (5-7 Units total) into the skin 3 (three) times daily. (Patient taking differently: Inject 10 Units into the skin 3 (three) times daily. ) 15 mL 3  . Insulin Pen Needle (BD PEN NEEDLE NANO U/F) 32G X 4 MM MISC USE TO INJECT TWICE DAILY LANTUS INJECTIONS.DX-E11.41 180 each 0  . LANTUS SOLOSTAR 100 UNIT/ML Solostar Pen INJECT  50 UNITS EVERY DAY  OR AS DIRECTED (Patient taking differently: INJECT  35 UNITS EVERY DAY IN THE EVENING) 45 mL 3  . Melatonin 5 MG TABS Take 5 mg by mouth at bedtime as needed (for sleep).    . metFORMIN (GLUCOPHAGE-XR) 500 MG 24 hr tablet TAKE 2 TABLETS TWICE DAILY AFTER MEALS FOR DIABETES 360 tablet 3  . Misc. Devices MISC Custom molded inserts for diabetic shoes - ICD10: E11.41. Detailed foot exam performed at the time of the visit on 02/17/2014. Patient has neuropathy, multiple digit  amputations and calluses. 2 each 1  . mupirocin ointment (BACTROBAN) 2 %     . OVER THE COUNTER MEDICATION 2 tablets 1 day or 1 dose. CinSulin (True Nature) Vitamin D, Cinnamon bark and Chromium    . Polyvinyl Alcohol-Povidone (REFRESH OP) Place 1 drop into both eyes daily as needed (dry eyes).    . rosuvastatin (CRESTOR) 20 MG tablet Take 1 tablet (20 mg total) by mouth daily. 90 tablet 1  . valsartan (DIOVAN) 320 MG tablet TAKE 1 TABLET EVERY MORNING FOR BLOOD PRESSURE 90 tablet 1  . aspirin-acetaminophen-caffeine (EXCEDRIN MIGRAINE) 660-630-16 MG per tablet Take 1-2 tablets by mouth every 6 (six) hours as needed (sinus headache).    . COLCRYS 0.6 MG tablet Take 0.6 mg by mouth daily as needed (for gout flareups).     Marland Kitchen oxyCODONE (OXY IR/ROXICODONE) 5 MG immediate release tablet Take 1 tablet (5 mg total) by mouth every 4 (four) hours as needed for moderate pain. (Patient not taking: Reported on 07/21/2014) 30 tablet 0   No current facility-administered medications for this visit.    OBJECTIVE: Middle-aged white woman  in no acute distress Filed Vitals:   07/21/14 1314  BP: 172/71  Pulse: 84  Temp: 98.3 F (36.8 C)  Resp: 18     Body mass index is 29.74 kg/(m^2).    ECOG FS:1 - Symptomatic but completely ambulatory  Skin: warm, dry  HEENT: sclerae anicteric, conjunctivae pink, oropharynx clear. No thrush or mucositis.  Lymph Nodes: No cervical or supraclavicular lymphadenopathy  Lungs: clear to auscultation bilaterally, no rales, wheezes, or rhonci  Heart: regular rate and rhythm  Abdomen: round, soft, non tender, positive bowel sounds  Musculoskeletal: No focal spinal tenderness, no peripheral edema, bilateral feet in orthotic shoe Neuro: non focal, well oriented, positive affect  Breasts: left breast status post lumpectomy and radiation. No evidence of recurrent disease. Bilateral axillae benign. Right breast unremarkable.  LAB RESULTS:  CMP     Component Value Date/Time   NA  140 06/22/2014 1222   NA 137 01/30/2014 1044   K 4.5 06/22/2014 1222  K 4.8 01/30/2014 1044   CL 100 01/30/2014 1044   CO2 28 06/22/2014 1222   CO2 24 01/30/2014 1044   GLUCOSE 170* 06/22/2014 1222   GLUCOSE 235* 01/30/2014 1044   BUN 33.7* 06/22/2014 1222   BUN 48* 01/30/2014 1044   CREATININE 1.8* 06/22/2014 1222   CREATININE 1.61* 01/30/2014 1044   CREATININE 1.55* 07/08/2013 0712   CALCIUM 10.2 06/22/2014 1222   CALCIUM 10.8* 01/30/2014 1044   PROT 6.7 06/22/2014 1222   PROT 6.9 01/30/2014 1044   ALBUMIN 3.4* 06/22/2014 1222   ALBUMIN 4.3 01/30/2014 1044   AST 16 06/22/2014 1222   AST 21 01/30/2014 1044   ALT 19 06/22/2014 1222   ALT 24 01/30/2014 1044   ALKPHOS 87 06/22/2014 1222   ALKPHOS 68 01/30/2014 1044   BILITOT 0.38 06/22/2014 1222   BILITOT 0.4 01/30/2014 1044   GFRNONAA 33* 01/30/2014 1044   GFRNONAA 34* 07/08/2013 0712   GFRAA 38* 01/30/2014 1044   GFRAA 39* 07/08/2013 0712    I No results found for: SPEP  Lab Results  Component Value Date   WBC 7.6 06/22/2014   NEUTROABS 5.0 06/22/2014   HGB 13.0 06/22/2014   HCT 39.5 06/22/2014   MCV 89.8 06/22/2014   PLT 226 06/22/2014      Chemistry      Component Value Date/Time   NA 140 06/22/2014 1222   NA 137 01/30/2014 1044   K 4.5 06/22/2014 1222   K 4.8 01/30/2014 1044   CL 100 01/30/2014 1044   CO2 28 06/22/2014 1222   CO2 24 01/30/2014 1044   BUN 33.7* 06/22/2014 1222   BUN 48* 01/30/2014 1044   CREATININE 1.8* 06/22/2014 1222   CREATININE 1.61* 01/30/2014 1044   CREATININE 1.55* 07/08/2013 0712      Component Value Date/Time   CALCIUM 10.2 06/22/2014 1222   CALCIUM 10.8* 01/30/2014 1044   ALKPHOS 87 06/22/2014 1222   ALKPHOS 68 01/30/2014 1044   AST 16 06/22/2014 1222   AST 21 01/30/2014 1044   ALT 19 06/22/2014 1222   ALT 24 01/30/2014 1044   BILITOT 0.38 06/22/2014 1222   BILITOT 0.4 01/30/2014 1044       No results found for: LABCA2  No components found for:  [id number]  No results for input(s): INR in the last 168 hours.  Urinalysis    Component Value Date/Time   COLORURINE YELLOW 05/30/2009 1500   APPEARANCEUR CLOUDY* 05/30/2009 1500   LABSPEC 1.029 05/30/2009 1500   PHURINE 5.0 05/30/2009 1500   GLUCOSEU NEGATIVE 05/30/2009 1500   HGBUR TRACE* 05/30/2009 1500   BILIRUBINUR NEGATIVE 05/30/2009 1500   KETONESUR NEGATIVE 05/30/2009 1500   PROTEINUR 100* 05/30/2009 1500   UROBILINOGEN 0.2 05/30/2009 1500   NITRITE NEGATIVE 05/30/2009 1500   LEUKOCYTESUR NEGATIVE 05/30/2009 1500    STUDIES: No results found. Most recent mammogram on 06/13/14 as [facility] was followed by a right breast ultrasound. Two 104m oval cysts were found to the right breast, likely benign. Recommended 6 months follow up.  ASSESSMENT: 69y.o. BRCA negative [place] woman status post left breast biopsy 02/10/2013 for a clinical T1a N0, stage IA invasive ductal carcinoma, grade 1, with a prognostic panel pending  (1) status post left lumpectomy and sentinel lymph node sampling 04/20/2013 for a pT1a pN0, stage IA invasive ductal carcinoma, grade 1, with negative margins  (2) completed adjuvant radiation 07/06/2013  (3) started anastrozole 09/05/2013; DEXA scan 07/28/2013 was normal with a T [name] of -0.3  PLAN:  [place] is doing well today. The labs were reviewed in detail and were entirely stable. She is tolerating the anastrozole to the best of her ability and plan to continue this drug for 5 total years of antiestrogen therapy. We discussed switching to tamoxifen for be benefit of being able to use vaginal estrogen preparations, and she declined at this time.   Natalyn will have a repeat right mammogram in September to revaluate the 2 cysts to this breast. She will return in October for labs and a follow up visit with Dr. Jana Hakim. She understands and agrees with this plan. She knows the goal of treatment in her case is cure. She has been encouraged to call with any issues  that might arise before her next visit here.  Laurie Panda, NP   07/21/2014 2:16 PM

## 2014-07-21 NOTE — Telephone Encounter (Signed)
per pof to sch pt appt-sch pt mamma-gave pt copy of sch

## 2014-07-31 DIAGNOSIS — L97511 Non-pressure chronic ulcer of other part of right foot limited to breakdown of skin: Secondary | ICD-10-CM | POA: Diagnosis not present

## 2014-07-31 DIAGNOSIS — E11621 Type 2 diabetes mellitus with foot ulcer: Secondary | ICD-10-CM | POA: Diagnosis not present

## 2014-08-04 ENCOUNTER — Ambulatory Visit: Payer: Medicare Other | Admitting: Internal Medicine

## 2014-08-04 ENCOUNTER — Encounter: Payer: Self-pay | Admitting: Internal Medicine

## 2014-08-04 DIAGNOSIS — Z79899 Other long term (current) drug therapy: Secondary | ICD-10-CM | POA: Insufficient documentation

## 2014-08-04 NOTE — Progress Notes (Signed)
Patient ID: [name], female   DOB: 07/29/1945, 69 y.o.   MRN: [id number]  [name]

## 2014-08-19 ENCOUNTER — Encounter: Payer: Self-pay | Admitting: *Deleted

## 2014-08-25 DIAGNOSIS — L97511 Non-pressure chronic ulcer of other part of right foot limited to breakdown of skin: Secondary | ICD-10-CM | POA: Diagnosis not present

## 2014-08-25 DIAGNOSIS — E11621 Type 2 diabetes mellitus with foot ulcer: Secondary | ICD-10-CM | POA: Diagnosis not present

## 2014-09-06 DIAGNOSIS — L97511 Non-pressure chronic ulcer of other part of right foot limited to breakdown of skin: Secondary | ICD-10-CM | POA: Diagnosis not present

## 2014-09-06 DIAGNOSIS — E11621 Type 2 diabetes mellitus with foot ulcer: Secondary | ICD-10-CM | POA: Diagnosis not present

## 2014-09-21 ENCOUNTER — Other Ambulatory Visit: Payer: Self-pay | Admitting: Internal Medicine

## 2014-09-21 ENCOUNTER — Other Ambulatory Visit: Payer: Self-pay | Admitting: Physician Assistant

## 2014-11-17 ENCOUNTER — Ambulatory Visit: Payer: Medicare Other | Admitting: Podiatry

## 2014-11-29 ENCOUNTER — Ambulatory Visit (INDEPENDENT_AMBULATORY_CARE_PROVIDER_SITE_OTHER): Payer: Medicare Other | Admitting: Podiatry

## 2014-11-29 ENCOUNTER — Encounter: Payer: Self-pay | Admitting: Podiatry

## 2014-11-29 VITALS — BP 157/77 | HR 81 | Resp 18

## 2014-11-29 DIAGNOSIS — E1141 Type 2 diabetes mellitus with diabetic mononeuropathy: Secondary | ICD-10-CM

## 2014-11-29 DIAGNOSIS — E11621 Type 2 diabetes mellitus with foot ulcer: Secondary | ICD-10-CM

## 2014-11-29 DIAGNOSIS — IMO0002 Reserved for concepts with insufficient information to code with codable children: Secondary | ICD-10-CM

## 2014-11-29 DIAGNOSIS — E1165 Type 2 diabetes mellitus with hyperglycemia: Secondary | ICD-10-CM

## 2014-11-29 DIAGNOSIS — L97529 Non-pressure chronic ulcer of other part of left foot with unspecified severity: Secondary | ICD-10-CM | POA: Diagnosis not present

## 2014-11-29 DIAGNOSIS — E1149 Type 2 diabetes mellitus with other diabetic neurological complication: Secondary | ICD-10-CM

## 2014-11-29 NOTE — Progress Notes (Signed)
Subjective:     Patient ID: [name], female   DOB: [birth year], 69 y.o.   MRN: [id number]  HPIPatient presents to the office for evaluation of her diabetic ulcer on the ball of her left foot.  She says she did much walking this past weekend due to death in family.  She says the skin broke down from its callused cover at the bottom of big toe.  She presents to the office today wearing a bandaid and there is drainage on the band-aid.   Review of Systems     Objective:   Physical Exam GENERAL APPEARANCE: Alert, conversant. Appropriately groomed. No acute distress.  VASCULAR: Pedal pulses are not  palpable at  [facility] and PT bilateral.  Capillary refill time is immediate to all digits,  Cold feet noted.  NEUROLOGIC: sensation is absent  to 5.07 monofilament at 5/5 sites bilateral.  Light touch is intact bilateral, Muscle strength normal.  MUSCULOSKELETAL: acceptable muscle strength, tone and stability bilateral.  Intrinsic muscluature intact bilateral.  Rectus appearance of foot and digits noted bilateral. Severe contracture HAV 1st MPH left foot. DERMATOLOGIC: skin color, texture, and turgor are within normal limits.  no interdigital maceration noted.  No open lesions present.  Digital nails are asymptomatic. No drainage noted. ULCER  White necrotic tissue on the outer rim of diabetic ulcer.  No redness or swelling noted.  Healthy pink tissue noted in center of the ulcer.      Assessment:     Diabetic Ulcer   Diabetic Neuropathy    Plan:     Debride necrotic tissue down to fat.  Neosporin/DSD  Continue to remain off foot and soak and then bandage.  RTC 1 week

## 2014-12-06 ENCOUNTER — Encounter: Payer: Self-pay | Admitting: Podiatry

## 2014-12-06 ENCOUNTER — Ambulatory Visit (INDEPENDENT_AMBULATORY_CARE_PROVIDER_SITE_OTHER): Payer: Medicare Other | Admitting: Podiatry

## 2014-12-06 VITALS — BP 167/94 | HR 78 | Resp 17

## 2014-12-06 DIAGNOSIS — E1165 Type 2 diabetes mellitus with hyperglycemia: Secondary | ICD-10-CM

## 2014-12-06 DIAGNOSIS — E1141 Type 2 diabetes mellitus with diabetic mononeuropathy: Secondary | ICD-10-CM | POA: Diagnosis not present

## 2014-12-06 DIAGNOSIS — L97529 Non-pressure chronic ulcer of other part of left foot with unspecified severity: Secondary | ICD-10-CM

## 2014-12-06 DIAGNOSIS — IMO0002 Reserved for concepts with insufficient information to code with codable children: Secondary | ICD-10-CM

## 2014-12-06 DIAGNOSIS — E11621 Type 2 diabetes mellitus with foot ulcer: Secondary | ICD-10-CM | POA: Diagnosis not present

## 2014-12-06 DIAGNOSIS — I739 Peripheral vascular disease, unspecified: Secondary | ICD-10-CM | POA: Diagnosis not present

## 2014-12-06 DIAGNOSIS — E1149 Type 2 diabetes mellitus with other diabetic neurological complication: Secondary | ICD-10-CM

## 2014-12-06 NOTE — Progress Notes (Signed)
Subjective:     Patient ID: [name], female   DOB: [birth year], 69 y.o.   MRN: [id number]  HPIPatient presents to the office for evaluation of her diabetic ulcer on the ball of her left foot.  .  She has been off weight bearing her left foot and soaking and caring for her ulcer due to big toe joint deformity.   Review of Systems     Objective:   Physical Exam GENERAL APPEARANCE: Alert, conversant. Appropriately groomed. No acute distress.  VASCULAR: Pedal pulses are not  palpable at  [facility] and PT bilateral.  Capillary refill time is immediate to all digits,  Cold feet noted.  NEUROLOGIC: sensation is absent  to 5.07 monofilament at 5/5 sites bilateral.  Light touch is intact bilateral, Muscle strength normal.  MUSCULOSKELETAL: acceptable muscle strength, tone and stability bilateral.  Intrinsic muscluature intact bilateral.  Rectus appearance of foot and digits noted bilateral. Severe contracture HAV 1st MPH left foot. DERMATOLOGIC: skin color, texture, and turgor are within normal limits.  no interdigital maceration noted.  No open lesions present.  Digital nails are asymptomatic. No drainage noted. ULCER   No redness or swelling noted.  Healthy pink tissue noted in center of the ulcer.Ulcer has no drainage or infection.  Ulcer has shrunk and now measures 4 x 6 mm.      Assessment:     Diabetic Ulcer   Diabetic Neuropathy    Plan:     Debride necrotic tissue down to fat.  Neosporin/DSD  Continue to remain off foot and soak and then bandage.  RTC 1 week

## 2014-12-08 ENCOUNTER — Other Ambulatory Visit: Payer: Self-pay | Admitting: Internal Medicine

## 2014-12-20 ENCOUNTER — Ambulatory Visit (INDEPENDENT_AMBULATORY_CARE_PROVIDER_SITE_OTHER): Payer: Medicare Other | Admitting: Podiatry

## 2014-12-20 ENCOUNTER — Encounter: Payer: Self-pay | Admitting: Podiatry

## 2014-12-20 DIAGNOSIS — L97529 Non-pressure chronic ulcer of other part of left foot with unspecified severity: Secondary | ICD-10-CM

## 2014-12-20 DIAGNOSIS — N63 Unspecified lump in breast: Secondary | ICD-10-CM | POA: Diagnosis not present

## 2014-12-20 DIAGNOSIS — IMO0002 Reserved for concepts with insufficient information to code with codable children: Secondary | ICD-10-CM

## 2014-12-20 DIAGNOSIS — N6001 Solitary cyst of right breast: Secondary | ICD-10-CM | POA: Diagnosis not present

## 2014-12-20 DIAGNOSIS — E1149 Type 2 diabetes mellitus with other diabetic neurological complication: Secondary | ICD-10-CM

## 2014-12-20 DIAGNOSIS — E11621 Type 2 diabetes mellitus with foot ulcer: Secondary | ICD-10-CM

## 2014-12-20 DIAGNOSIS — E1141 Type 2 diabetes mellitus with diabetic mononeuropathy: Secondary | ICD-10-CM | POA: Diagnosis not present

## 2014-12-20 DIAGNOSIS — E1165 Type 2 diabetes mellitus with hyperglycemia: Secondary | ICD-10-CM

## 2014-12-20 NOTE — Progress Notes (Signed)
Subjective:     Patient ID: [name], female   DOB: 1946/03/22, 69 y.o.   MRN: [id number]  HPIPatient presents to the office for evaluation of her diabetic ulcer on the ball of her left foot.  .  She has been off weight bearing her left foot and soaking and caring for her ulcer due to big toe joint.  She presents today wearing her honey bandage and says the ulcer has healed nicely.  She denies any drainage from the ulcer that she has seen.   Review of Systems     Objective:   Physical Exam GENERAL APPEARANCE: Alert, conversant. Appropriately groomed. No acute distress.  VASCULAR: Pedal pulses are not  palpable at  [facility] and PT bilateral.  Capillary refill time is immediate to all digits,  Cold feet noted.  NEUROLOGIC: sensation is absent  to 5.07 monofilament at 5/5 sites bilateral.  Light touch is intact bilateral, Muscle strength normal.  MUSCULOSKELETAL: acceptable muscle strength, tone and stability bilateral.  Intrinsic muscluature intact bilateral.  Rectus appearance of foot and digits noted bilateral. Severe contracture HAV 1st MPJ left foot. DERMATOLOGIC: skin color, texture, and turgor are within normal limits.  no interdigital maceration noted.  No open lesions present.  Digital nails are asymptomatic. No drainage noted. ULCER   No redness or swelling noted.  Healthy pink tissue noted in center of the ulcer.Ulcer has no drainage or infection.  Ulcer has shrunk and has completely closed at this visit.      Assessment:     Diabetic Ulcer   Diabetic Neuropathy    Plan:     Debride necrotic tissue down to fat.   Continue to remain off foot and soak and then bandage.   To consialso to check on cam walker for her to wear at home.der physical therapy to determine if streching would help prevent her contracture.

## 2015-01-03 ENCOUNTER — Telehealth: Payer: Self-pay | Admitting: *Deleted

## 2015-01-03 NOTE — Telephone Encounter (Signed)
Pt asked if Dr. Prudence Davidson had a chance to order PT for her left foot and toe flexor, with [facility].

## 2015-01-12 ENCOUNTER — Other Ambulatory Visit: Payer: Self-pay | Admitting: Internal Medicine

## 2015-01-17 ENCOUNTER — Encounter: Payer: Self-pay | Admitting: Podiatry

## 2015-01-17 ENCOUNTER — Ambulatory Visit (INDEPENDENT_AMBULATORY_CARE_PROVIDER_SITE_OTHER): Payer: Medicare Other | Admitting: Podiatry

## 2015-01-17 VITALS — BP 163/83 | HR 79 | Resp 16

## 2015-01-17 DIAGNOSIS — L89891 Pressure ulcer of other site, stage 1: Secondary | ICD-10-CM

## 2015-01-17 DIAGNOSIS — L97529 Non-pressure chronic ulcer of other part of left foot with unspecified severity: Principal | ICD-10-CM

## 2015-01-17 DIAGNOSIS — E11621 Type 2 diabetes mellitus with foot ulcer: Secondary | ICD-10-CM

## 2015-01-17 NOTE — Progress Notes (Signed)
Subjective:     Patient ID: [name], female   DOB: Jul 28, 1945, 69 y.o.   MRN: [id number]  HPIThis patient presents to the office for continued evaluation of her diabetic ulcer left foot.  She presents to the office today saying she has no pain, no drainage from her ulcer.  She says the ulcer has minimal pain and she has returned to her mens running shoes.  She is pleased with her ulcer at this visit.   Review of Systems     Objective:   Physical Exam  Objective:   Physical Exam GENERAL APPEARANCE: Alert, conversant. Appropriately groomed. No acute distress.  VASCULAR: Pedal pulses are not palpable at [facility] and PT bilateral. Capillary refill time is immediate to all digits, Cold feet noted.  NEUROLOGIC: sensation is absent to 5.07 monofilament at 5/5 sites bilateral. Light touch is intact bilateral, Muscle strength normal.  MUSCULOSKELETAL: acceptable muscle strength, tone and stability bilateral. Intrinsic muscluature intact bilateral. Rectus appearance of foot and digits noted bilateral. Severe contracture HAV 1st MPJ left foot. DERMATOLOGIC: skin color, texture, and turgor are within normal limits. no interdigital maceration noted. No open lesions present. Digital nails are asymptomatic. No drainage noted. ULCER No redness or swelling noted. Hyperkeratotic roof  noted in center of the ulcer.Ulcer has no drainage or infection. Ulcer has shrunk and has completely closed at this visit.            Assessment:     Diabetic ulcer     Plan:     Debride tissue at diabetic ulcer.  Dispersion padding applied to her sockliner in her new shoe.

## 2015-01-25 ENCOUNTER — Other Ambulatory Visit: Payer: Medicare Other

## 2015-01-25 ENCOUNTER — Ambulatory Visit: Payer: Medicare Other | Admitting: Nurse Practitioner

## 2015-01-26 ENCOUNTER — Other Ambulatory Visit: Payer: Medicare Other

## 2015-01-26 ENCOUNTER — Ambulatory Visit: Payer: Medicare Other | Admitting: Nurse Practitioner

## 2015-02-02 ENCOUNTER — Other Ambulatory Visit: Payer: Self-pay | Admitting: Internal Medicine

## 2015-02-02 ENCOUNTER — Telehealth: Payer: Self-pay | Admitting: Oncology

## 2015-02-02 NOTE — Telephone Encounter (Signed)
Patient call to r/s 10/31 appointments. Gave patient new appointments 04/16/15.

## 2015-02-03 ENCOUNTER — Other Ambulatory Visit: Payer: Self-pay | Admitting: Internal Medicine

## 2015-02-05 ENCOUNTER — Other Ambulatory Visit: Payer: Medicare Other

## 2015-02-05 ENCOUNTER — Ambulatory Visit: Payer: Medicare Other | Admitting: Oncology

## 2015-02-07 ENCOUNTER — Encounter: Payer: Self-pay | Admitting: Internal Medicine

## 2015-02-14 ENCOUNTER — Encounter: Payer: Self-pay | Admitting: Internal Medicine

## 2015-02-14 ENCOUNTER — Ambulatory Visit (INDEPENDENT_AMBULATORY_CARE_PROVIDER_SITE_OTHER): Payer: Medicare Other | Admitting: Internal Medicine

## 2015-02-14 VITALS — BP 138/84 | HR 88 | Temp 98.0°F | Resp 18 | Ht 65.0 in | Wt 176.0 lb

## 2015-02-14 DIAGNOSIS — Z789 Other specified health status: Secondary | ICD-10-CM | POA: Diagnosis not present

## 2015-02-14 DIAGNOSIS — Z1389 Encounter for screening for other disorder: Secondary | ICD-10-CM

## 2015-02-14 DIAGNOSIS — E1165 Type 2 diabetes mellitus with hyperglycemia: Secondary | ICD-10-CM | POA: Diagnosis not present

## 2015-02-14 DIAGNOSIS — M1009 Idiopathic gout, multiple sites: Secondary | ICD-10-CM

## 2015-02-14 DIAGNOSIS — Z9181 History of falling: Secondary | ICD-10-CM

## 2015-02-14 DIAGNOSIS — E559 Vitamin D deficiency, unspecified: Secondary | ICD-10-CM | POA: Diagnosis not present

## 2015-02-14 DIAGNOSIS — I10 Essential (primary) hypertension: Secondary | ICD-10-CM | POA: Diagnosis not present

## 2015-02-14 DIAGNOSIS — E782 Mixed hyperlipidemia: Secondary | ICD-10-CM | POA: Diagnosis not present

## 2015-02-14 DIAGNOSIS — M109 Gout, unspecified: Secondary | ICD-10-CM

## 2015-02-14 DIAGNOSIS — IMO0002 Reserved for concepts with insufficient information to code with codable children: Secondary | ICD-10-CM

## 2015-02-14 DIAGNOSIS — E663 Overweight: Secondary | ICD-10-CM | POA: Insufficient documentation

## 2015-02-14 DIAGNOSIS — E1140 Type 2 diabetes mellitus with diabetic neuropathy, unspecified: Secondary | ICD-10-CM | POA: Diagnosis not present

## 2015-02-14 DIAGNOSIS — Z1212 Encounter for screening for malignant neoplasm of rectum: Secondary | ICD-10-CM

## 2015-02-14 DIAGNOSIS — E1022 Type 1 diabetes mellitus with diabetic chronic kidney disease: Secondary | ICD-10-CM | POA: Diagnosis not present

## 2015-02-14 DIAGNOSIS — I2510 Atherosclerotic heart disease of native coronary artery without angina pectoris: Secondary | ICD-10-CM

## 2015-02-14 DIAGNOSIS — N183 Chronic kidney disease, stage 3 (moderate): Secondary | ICD-10-CM | POA: Diagnosis not present

## 2015-02-14 DIAGNOSIS — Z79899 Other long term (current) drug therapy: Secondary | ICD-10-CM | POA: Diagnosis not present

## 2015-02-14 DIAGNOSIS — M1000 Idiopathic gout, unspecified site: Secondary | ICD-10-CM | POA: Diagnosis not present

## 2015-02-14 DIAGNOSIS — Z23 Encounter for immunization: Secondary | ICD-10-CM

## 2015-02-14 DIAGNOSIS — Z6829 Body mass index (BMI) 29.0-29.9, adult: Secondary | ICD-10-CM | POA: Diagnosis not present

## 2015-02-14 DIAGNOSIS — Z1331 Encounter for screening for depression: Secondary | ICD-10-CM

## 2015-02-14 LAB — CBC WITH DIFFERENTIAL/PLATELET
BASOS PCT: 0 % (ref 0–1)
Basophils Absolute: 0 10*3/uL (ref 0.0–0.1)
EOS ABS: 0.2 10*3/uL (ref 0.0–0.7)
Eosinophils Relative: 2 % (ref 0–5)
HCT: 40.1 % (ref 36.0–46.0)
HEMOGLOBIN: 13 g/dL (ref 12.0–15.0)
Lymphocytes Relative: 22 % (ref 12–46)
Lymphs Abs: 2.5 10*3/uL (ref 0.7–4.0)
MCH: 28.4 pg (ref 26.0–34.0)
MCHC: 32.4 g/dL (ref 30.0–36.0)
MCV: 87.6 fL (ref 78.0–100.0)
MONO ABS: 0.9 10*3/uL (ref 0.1–1.0)
MONOS PCT: 8 % (ref 3–12)
MPV: 10.8 fL (ref 8.6–12.4)
NEUTROS ABS: 7.6 10*3/uL (ref 1.7–7.7)
NEUTROS PCT: 68 % (ref 43–77)
PLATELETS: 303 10*3/uL (ref 150–400)
RBC: 4.58 MIL/uL (ref 3.87–5.11)
RDW: 14.9 % (ref 11.5–15.5)
WBC: 11.2 10*3/uL — AB (ref 4.0–10.5)

## 2015-02-14 LAB — TSH: TSH: 1.77 u[IU]/mL (ref 0.350–4.500)

## 2015-02-14 NOTE — Patient Instructions (Signed)
Recommend Adult Low Dose Aspirin or   coated  Aspirin 81 mg daily   To reduce risk of Colon Cancer 20 %,   Skin Cancer 26 % ,   Melanoma 46%   and   Pancreatic cancer 60%   ++++++++++++++++++++++++++++++++++++++++++++++++++++++  Vitamin D goal   is between 70-100.   Please make sure that you are taking your Vitamin D as directed.   It is very important as a natural anti-inflammatory   helping hair, skin, and nails, as well as reducing stroke and heart attack risk.   It helps your bones and helps with mood.  It also decreases numerous cancer risks so please take it as directed.   Low Vit D is associated with a 200-300% higher risk for CANCER   and 200-300% higher risk for HEART   ATTACK  &  STROKE.   ......................................  It is also associated with higher death rate at younger ages,   autoimmune diseases like Rheumatoid arthritis, Lupus, Multiple Sclerosis.     Also many other serious conditions, like depression, Alzheimer's  Dementia, infertility, muscle aches, fatigue, fibromyalgia - just to name a few.  ++++++++++++++++++++++++++++++++++++++++++++++++  Recommend the book "The END of DIETING" by Dr Joel Fuhrman   & the book "The END of DIABETES " by Dr Joel Fuhrman  At [employer] - get book & Audio CD's     Being diabetic has a  300% increased risk for heart attack, stroke, cancer, and alzheimer- type vascular dementia. It is very important that you work harder with diet by avoiding all foods that are white. Avoid white rice (brown & wild rice is OK), white potatoes (sweetpotatoes in moderation is OK), White bread or wheat bread or anything made out of white flour like bagels, donuts, rolls, buns, biscuits, cakes, pastries, cookies, pizza crust, and pasta (made from white flour & egg whites) - vegetarian pasta or spinach or wheat pasta is OK. Multigrain breads like Arnold's or [employer], or multigrain sandwich thins or flatbreads.  Diet,  exercise and weight loss can reverse and cure diabetes in the early stages.  Diet, exercise and weight loss is very important in the control and prevention of complications of diabetes which affects every system in your body, ie. Brain - dementia/stroke, eyes - glaucoma/blindness, heart - heart attack/heart failure, kidneys - dialysis, stomach - gastric paralysis, intestines - malabsorption, nerves - severe painful neuritis, circulation - gangrene & loss of a leg(s), and finally cancer and Alzheimers.    I recommend avoid fried & greasy foods,  sweets/candy, white rice (brown or wild rice or Quinoa is OK), white potatoes (sweet potatoes are OK) - anything made from white flour - bagels, doughnuts, rolls, buns, biscuits,white and wheat breads, pizza crust and traditional pasta made of white flour & egg white(vegetarian pasta or spinach or wheat pasta is OK).  Multi-grain bread is OK - like multi-grain flat bread or sandwich thins. Avoid alcohol in excess. Exercise is also important.    Eat all the vegetables you want - avoid meat, especially red meat and dairy - especially cheese.  Cheese is the most concentrated form of trans-fats which is the worst thing to clog up our arteries. Veggie cheese is OK which can be found in the fresh produce section at [employer] or [employer] or [employer]  ++++++++++++++++++++++++++++++++++++++++++++++++++ DASH Eating Plan  DASH stands for "Dietary Approaches to Stop Hypertension."   The DASH eating plan is a healthy eating plan that has been shown to reduce high   blood pressure (hypertension). Additional health benefits may include reducing the risk of type 2 diabetes mellitus, heart disease, and stroke. The DASH eating plan may also help with weight loss.  WHAT DO I NEED TO KNOW ABOUT THE DASH EATING PLAN? For the DASH eating plan, you will follow these general guidelines:  Choose foods with a percent daily value for sodium of less than 5% (as listed on the food  label).  Use salt-free seasonings or herbs instead of table salt or sea salt.  Check with your health care provider or pharmacist before using salt substitutes.  Eat lower-sodium products, often labeled as "lower sodium" or "no salt added."  Eat fresh foods.  Eat more vegetables, fruits, and low-fat dairy products.    Choose whole grains. Look for the word "whole" as the first word in the ingredient list.  Choose fish   Limit sweets, desserts, sugars, and sugary drinks.  Choose heart-healthy fats.  Eat veggie cheese   Eat more home-cooked food and less restaurant, buffet, and fast food.  Limit fried foods.  Cook foods using methods other than frying.  Limit canned vegetables. If you do use them, rinse them well to decrease the sodium.  When eating at a restaurant, ask that your food be prepared with less salt, or no salt if possible.                      WHAT FOODS CAN I EAT?  Seek help from a dietitian for individual calorie needs. Grains Whole grain or whole wheat bread. Brown rice. Whole grain or whole wheat pasta. Quinoa, bulgur, and whole grain cereals. Low-sodium cereals. Corn or whole wheat flour tortillas. Whole grain cornbread. Whole grain crackers. Low-sodium crackers.  Vegetables Fresh or frozen vegetables (raw, steamed, roasted, or grilled). Low-sodium or reduced-sodium tomato and vegetable juices. Low-sodium or reduced-sodium tomato sauce and paste. Low-sodium or reduced-sodium canned vegetables.   Fruits All fresh, canned (in natural juice), or frozen fruits.  Meat and Other Protein Products  All fish and seafood.  Dried beans, peas, or lentils. Unsalted nuts and seeds. Unsalted canned beans. Dairy Low-fat dairy products, such as skim or 1% milk, 2% or reduced-fat cheeses, low-fat ricotta or cottage cheese, or plain low-fat yogurt. Low-sodium or reduced-sodium cheeses.  Fats and Oils Tub margarines without trans fats. Light or reduced-fat mayonnaise  and salad dressings (reduced sodium). Avocado. Safflower, olive, or canola oils. Natural peanut or almond butter.  Other Unsalted popcorn and pretzels. The items listed above may not be a complete list of recommended foods or beverages. Contact your dietitian for more options.  +++++++++++++++++++++++++++++++++++++++++++  WHAT FOODS ARE NOT RECOMMENDED?  Grains/ White flour or wheat flour  White bread. White pasta. White rice. Refined cornbread. Bagels and croissants. Crackers that contain trans fat.  Vegetables  Creamed or fried vegetables. Vegetables in a . Regular canned vegetables. Regular canned tomato sauce and paste. Regular tomato and vegetable juices.  Fruits Dried fruits. Canned fruit in light or heavy syrup. Fruit juice.  Meat and Other Protein Products Meat in general. Fatty cuts of meat. Ribs, chicken wings, bacon, sausage, bologna, salami, chitterlings, fatback, hot dogs, bratwurst, and packaged luncheon meats. Salted nuts and seeds. Canned beans with salt.  Dairy Whole or 2% milk, cream, half-and-half, and cream cheese. Whole-fat or sweetened yogurt. Full-fat cheeses or blue cheese. Nondairy creamers and whipped toppings. Processed cheese, cheese spreads, or cheese curds.  Condiments Onion and garlic salt, seasoned salt, table salt, and sea  salt. Canned and packaged gravies. Worcestershire sauce. Tartar sauce. Barbecue sauce. Teriyaki sauce. Soy sauce, including reduced sodium. Steak sauce. Fish sauce. Oyster sauce. Cocktail sauce. Horseradish. Ketchup and mustard. Meat flavorings and tenderizers. Bouillon cubes. Hot sauce. Tabasco sauce. Marinades. Taco seasonings. Relishes.  Fats and Oils Butter, stick margarine, lard, shortening, ghee, and bacon fat. Coconut, palm kernel, or palm oils. Regular salad dressings.  Pickles and olives. Salted popcorn and pretzels. The items listed above may not be a complete list of foods and beverages to avoid.   Preventive Care for  Adults  A healthy lifestyle and preventive care can promote health and wellness. Preventive health guidelines for women include the following key practices.  A routine yearly physical is a good way to check with your health care provider about your health and preventive screening. It is a chance to share any concerns and updates on your health and to receive a thorough exam.  Visit your dentist for a routine exam and preventive care every 6 months. Brush your teeth twice a day and floss once a day. Good oral hygiene prevents tooth decay and gum disease.  The frequency of eye exams is based on your age, health, family medical history, use of contact lenses, and other factors. Follow your health care provider's recommendations for frequency of eye exams.  Eat a healthy diet. Foods like vegetables, fruits, whole grains, low-fat dairy products, and lean protein foods contain the nutrients you need without too many calories. Decrease your intake of foods high in solid fats, added sugars, and salt. Eat the right amount of calories for you.Get information about a proper diet from your health care provider, if necessary.  Regular physical exercise is one of the most important things you can do for your health. Most adults should get at least 150 minutes of moderate-intensity exercise (any activity that increases your heart rate and causes you to sweat) each week. In addition, most adults need muscle-strengthening exercises on 2 or more days a week.  Maintain a healthy weight. The body mass index (BMI) is a screening tool to identify possible weight problems. It provides an estimate of body fat based on height and weight. Your health care provider can find your BMI and can help you achieve or maintain a healthy weight.For adults 20 years and older:  A BMI below 18.5 is considered underweight.  A BMI of 18.5 to 24.9 is normal.  A BMI of 25 to 29.9 is considered overweight.  A BMI of 30 and above is  considered obese.  Maintain normal blood lipids and cholesterol levels by exercising and minimizing your intake of saturated fat. Eat a balanced diet with plenty of fruit and vegetables. If your lipid or cholesterol levels are high, you are over 50, or you are at high risk for heart disease, you may need your cholesterol levels checked more frequently.Ongoing high lipid and cholesterol levels should be treated with medicines if diet and exercise are not working.  If you smoke, find out from your health care provider how to quit. If you do not use tobacco, do not start.  Lung cancer screening is recommended for adults aged 55-80 years who are at high risk for developing lung cancer because of a history of smoking. A yearly low-dose CT scan of the lungs is recommended for people who have at least a 30-pack-year history of smoking and are a current smoker or have quit within the past 15 years. A pack year of smoking is smoking an   average of 1 pack of cigarettes a day for 1 year (for example: 1 pack a day for 30 years or 2 packs a day for 15 years). Yearly screening should continue until the smoker has stopped smoking for at least 15 years. Yearly screening should be stopped for people who develop a health problem that would prevent them from having lung cancer treatment.  Avoid use of street drugs. Do not share needles with anyone. Ask for help if you need support or instructions about stopping the use of drugs.  High blood pressure causes heart disease and increases the risk of stroke.  Ongoing high blood pressure should be treated with medicines if weight loss and exercise do not work.  If you are 62-34 years old, ask your health care provider if you should take aspirin to prevent strokes.  Diabetes screening involves taking a blood sample to check your fasting blood sugar level. This should be done once every 3 years, after age 63, if you are within normal weight and without risk factors for diabetes.  Testing should be considered at a younger age or be carried out more frequently if you are overweight and have at least 1 risk factor for diabetes.  Breast cancer screening is essential preventive care for women. You should practice "breast self-awareness." This means understanding the normal appearance and feel of your breasts and may include breast self-examination. Any changes detected, no matter how small, should be reported to a health care provider. Women in their 72s and 30s should have a clinical breast exam (CBE) by a health care provider as part of a regular health exam every 1 to 3 years. After age 58, women should have a CBE every year. Starting at age 51, women should consider having a mammogram (breast X-ray test) every year. Women who have a family history of breast cancer should talk to their health care provider about genetic screening. Women at a high risk of breast cancer should talk to their health care providers about having an MRI and a mammogram every year.  Breast cancer gene (BRCA)-related cancer risk assessment is recommended for women who have family members with BRCA-related cancers. BRCA-related cancers include breast, ovarian, tubal, and peritoneal cancers. Having family members with these cancers may be associated with an increased risk for harmful changes (mutations) in the breast cancer genes BRCA1 and BRCA2. Results of the assessment will determine the need for genetic counseling and BRCA1 and BRCA2 testing.  Routine pelvic exams to screen for cancer are no longer recommended for nonpregnant women who are considered low risk for cancer of the pelvic organs (ovaries, uterus, and vagina) and who do not have symptoms. Ask your health care provider if a screening pelvic exam is right for you.  If you have had past treatment for cervical cancer or a condition that could lead to cancer, you need Pap tests and screening for cancer for at least 20 years after your treatment. If Pap  tests have been discontinued, your risk factors (such as having a new sexual partner) need to be reassessed to determine if screening should be resumed. Some women have medical problems that increase the chance of getting cervical cancer. In these cases, your health care provider may recommend more frequent screening and Pap tests.    Colorectal cancer can be detected and often prevented. Most routine colorectal cancer screening begins at the age of 8 years and continues through age 45 years. However, your health care provider may recommend screening at an  earlier age if you have risk factors for colon cancer. On a yearly basis, your health care provider may provide home test kits to check for hidden blood in the stool. Use of a small camera at the end of a tube, to directly examine the colon (sigmoidoscopy or colonoscopy), can detect the earliest forms of colorectal cancer. Talk to your health care provider about this at age 35, when routine screening begins. Direct exam of the colon should be repeated every 5-10 years through age 58 years, unless early forms of pre-cancerous polyps or small growths are found.  Osteoporosis is a disease in which the bones lose minerals and strength with aging. This can result in serious bone fractures or breaks. The risk of osteoporosis can be identified using a bone density scan. Women ages 75 years and over and women at risk for fractures or osteoporosis should discuss screening with their health care providers. Ask your health care provider whether you should take a calcium supplement or vitamin D to reduce the rate of osteoporosis.  Menopause can be associated with physical symptoms and risks. Hormone replacement therapy is available to decrease symptoms and risks. You should talk to your health care provider about whether hormone replacement therapy is right for you.  Use sunscreen. Apply sunscreen liberally and repeatedly throughout the day. You should seek shade  when your shadow is shorter than you. Protect yourself by wearing long sleeves, pants, a wide-brimmed hat, and sunglasses year round, whenever you are outdoors.  Once a month, do a whole body skin exam, using a mirror to look at the skin on your back. Tell your health care provider of new moles, moles that have irregular borders, moles that are larger than a pencil eraser, or moles that have changed in shape or color.  Stay current with required vaccines (immunizations).  Influenza vaccine. All adults should be immunized every year.  Tetanus, diphtheria, and acellular pertussis (Td, Tdap) vaccine. Pregnant women should receive 1 dose of Tdap vaccine during each pregnancy. The dose should be obtained regardless of the length of time since the last dose. Immunization is preferred during the 27th-36th week of gestation. An adult who has not previously received Tdap or who does not know her vaccine status should receive 1 dose of Tdap. This initial dose should be followed by tetanus and diphtheria toxoids (Td) booster doses every 10 years. Adults with an unknown or incomplete history of completing a 3-dose immunization series with Td-containing vaccines should begin or complete a primary immunization series including a Tdap dose. Adults should receive a Td booster every 10 years.    Zoster vaccine. One dose is recommended for adults aged 79 years or older unless certain conditions are present.    Pneumococcal 13-valent conjugate (PCV13) vaccine. When indicated, a person who is uncertain of her immunization history and has no record of immunization should receive the PCV13 vaccine. An adult aged 90 years or older who has certain medical conditions and has not been previously immunized should receive 1 dose of PCV13 vaccine. This PCV13 should be followed with a dose of pneumococcal polysaccharide (PPSV23) vaccine. The PPSV23 vaccine dose should be obtained at least 8 weeks after the dose of PCV13 vaccine.  An adult aged 89 years or older who has certain medical conditions and previously received 1 or more doses of PPSV23 vaccine should receive 1 dose of PCV13. The PCV13 vaccine dose should be obtained 1 or more years after the last PPSV23 vaccine dose.  Pneumococcal polysaccharide (PPSV23) vaccine. When PCV13 is also indicated, PCV13 should be obtained first. All adults aged 38 years and older should be immunized. An adult younger than age 24 years who has certain medical conditions should be immunized. Any person who resides in a nursing home or long-term care facility should be immunized. An adult smoker should be immunized. People with an immunocompromised condition and certain other conditions should receive both PCV13 and PPSV23 vaccines. People with human immunodeficiency virus (HIV) infection should be immunized as soon as possible after diagnosis. Immunization during chemotherapy or radiation therapy should be avoided. Routine use of PPSV23 vaccine is not recommended for American Indians, [place]atives, or people younger than 65 years unless there are medical conditions that require PPSV23 vaccine. When indicated, people who have unknown immunization and have no record of immunization should receive PPSV23 vaccine. One-time revaccination 5 years after the first dose of PPSV23 is recommended for people aged 19-64 years who have chronic kidney failure, nephrotic syndrome, asplenia, or immunocompromised conditions. People who received 1-2 doses of PPSV23 before age 62 years should receive another dose of PPSV23 vaccine at age 66 years or later if at least 5 years have passed since the previous dose. Doses of PPSV23 are not needed for people immunized with PPSV23 at or after age 26 years.   Preventive Services / Frequency  Ages 52 years and over  Blood pressure check.  Lipid and cholesterol check.  Lung cancer screening. / Every year if you are aged 29-80 years and have a 30-pack-year history of  smoking and currently smoke or have quit within the past 15 years. Yearly screening is stopped once you have quit smoking for at least 15 years or develop a health problem that would prevent you from having lung cancer treatment.  Clinical breast exam.** / Every year after age 30 years.  BRCA-related cancer risk assessment.** / For women who have family members with a BRCA-related cancer (breast, ovarian, tubal, or peritoneal cancers).  Mammogram.** / Every year beginning at age 41 years and continuing for as long as you are in good health. Consult with your health care provider.  Pap test.** / Every 3 years starting at age 67 years through age 49 or 56 years with 3 consecutive normal Pap tests. Testing can be stopped between 65 and 70 years with 3 consecutive normal Pap tests and no abnormal Pap or HPV tests in the past 10 years.  Fecal occult blood test (FOBT) of stool. / Every year beginning at age 15 years and continuing until age 70 years. You may not need to do this test if you get a colonoscopy every 10 years.  Flexible sigmoidoscopy or colonoscopy.** / Every 5 years for a flexible sigmoidoscopy or every 10 years for a colonoscopy beginning at age 71 years and continuing until age 79 years.  Hepatitis C blood test.** / For all people born from [birth year] through 1965 and any individual with known risks for hepatitis C.  Osteoporosis screening.** / A one-time screening for women ages 81 years and over and women at risk for fractures or osteoporosis.  Skin self-exam. / Monthly.  Influenza vaccine. / Every year.  Tetanus, diphtheria, and acellular pertussis (Tdap/Td) vaccine.** / 1 dose of Td every 10 years.  Zoster vaccine.** / 1 dose for adults aged 79 years or older.  Pneumococcal 13-valent conjugate (PCV13) vaccine.** / Consult your health care provider.  Pneumococcal polysaccharide (PPSV23) vaccine.** / 1 dose for all adults aged 22 years and older.  Screening for abdominal aortic  aneurysm (AAA)  by ultrasound is recommended for people who have history of high blood pressure or who are current or former smokers.

## 2015-02-14 NOTE — Progress Notes (Signed)
Patient ID: [name], female   DOB: 1945-04-09, 69 y.o.   MRN: [id number]  Annual Screening/Preventative Comprehensive Examination  This very nice 69 y.o. MWF presents for presents for a comprehensive evaluation and management of multiple medical co-morbidities.  Patient has been followed for HTN, T2_DM, Hyperlipidemia  and Vitamin D Deficiency. Patient relates that she is non-compliant and overdue for recommended f/u .     HTN predates circa 1980's. Patient's BP has been controlled at home and patient denies any cardiac symptoms as chest pain, palpitations, shortness of breath, dizziness or ankle swelling. Today's BP: 138/84 mmHg .   Patient's hyperlipidemia is controlled with diet and medications. Patient denies myalgias or other medication SE's. Last lipids were at goal with  T Chol 122, HDL 26, but with elevated  TG 487 and LDL - not calc in Aug 2015.    Patient is Morbidly Obese  And consequently has insulin dependent T2_DM predating since 2000 and patient denies reactive hypoglycemic symptoms, visual blurring or diabetic polys, but has fleeting numb paresthesias of her feet. Last A1c was 9.0% on 02/17/2014. She assumes no responsibility for her admittedly very poor eating habits blaming her husband for making culinary temptations available.    Finally, patient has history of Vitamin D Deficiency and last Vitamin D was 48 in Oct 2015.   Medication Sig  . allopurinol (ZYLOPRIM) 100 MG tablet TAKE 1 TABLET TWICE DAILY  . anastrozole (ARIMIDEX) 1 MG tablet TAKE 1 TABLET EVERY DAY  . aspirin 325 MG tablet Take 325 mg by mouth at bedtime.   Marland Kitchen  EXCEDRIN MIGRAINE 250-250-65 MG  Take 1-2 tablets by mouth every 6 (six) hours as needed (sinus headache).  . carvedilol (COREG) 12.5 MG tablet TAKE 1 TABLET TWICE DAILY FOR BLOOD PRESSURE  . COLCRYS 0.6 MG tablet Take 0.6 mg by mouth daily as needed (for gout flareups).   Marland Kitchen glucose blood test strip Check sugars 3x a day  . hctz 12.5 MG tablet TAKE 1  TABLET EVERY DAY  . HUMALOG KWIKPEN  KiwkPen Patient taking differently: Inject 10 Units into the skin daily. )  . LANTUS SOLOSTAR Solostar Pen  taking differently: INJECT  35 UNITS SUBCUTANEOUSLY EVERY DAY  OR AS DIRECTED   . Melatonin 5 MG TABS Take 5 mg by mouth at bedtime as needed (for sleep).  . metFORMIN -XR 500 MG 24 hr tablet TAKE 2 TABLETS TWICE DAILY AFTER MEALS FOR DIABETES  . mupirocin ointment (BACTROBAN) 2 %   . oxyCODONE (OXY IR/ROXICODONE) 5 MG immediate release tablet Take 1 tablet (5 mg total) by mouth every 4 (four) hours as needed for moderate pain.  . Polyvinyl Alcohol-Povidone (REFRESH) Place 1 drop into both eyes daily as needed (dry eyes).  . rosuvastatin (CRESTOR) 20 MG tablet TAKE 1 TABLET  DAILY  . valsartan (DIOVAN) 320 MG tablet TAKE 1 TABLET EVERY MORNING FOR BLOOD PRESSURE   Allergies  Allergen Reactions  . Atenolol Other (See Comments)    Exacerbates gout  . Contrast Media [Iodinated Diagnostic Agents] Rash  . Latex Rash  . Omnipaque [Iohexol] Rash   Past Medical History  Diagnosis Date  . Hypertension   . Coronary artery disease     NSTEMI in 05/2009;  LHC with 2 v CAD - > CABG (LIMA-LAD, SVG-diagonal, SVG-OM1/OM 2).   . Hyperlipidemia   . Ischemic cardiomyopathy     Echo 07/2009: Mild LVH, EF 40-45%, inferior and posterior HK, grade 1 diastolic dysfunction, moderate MAC, mild to moderate  MR, mild LAE  . PAD (peripheral artery disease) ([place])   . Family history of anesthesia complication     Mom has a hard time to wake up  . Neuropathy (HCC)     Hx; of B/L feet  . Type II diabetes mellitus ([place])   . Foot ulcer ([place])     "I've had them on both feet" (08/27/2012)  . Osteomyelitis of foot ([place])   . Sinus headache     "occasionally" (08/27/2012)  . CKD (chronic kidney disease), stage I   . 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 Allergy   . NSTEMI (non-ST elevated myocardial infarction) ([place])  05/23/2009    Archie Endo 05/23/2009 (08/27/2012)  . History of radiation therapy 06/09/13-07/06/13    left breast 50Gy  . Type II or unspecified type diabetes mellitus without mention of complication, not stated as uncontrolled   . Arthritis     "maybe in my lower back" (08/27/2012)  . Gouty arthritis     "right index finger" (08/27/2012)  . Vitamin D deficiency   . Type II diabetes mellitus with neurological manifestations, uncontrolled ([place]) 02/17/2014   Health Maintenance  Topic Date Due  . Hepatitis C Screening  Apr 11, 1945  . HEMOGLOBIN A1C  08/18/2014  . INFLUENZA VACCINE  11/06/2014  . URINE MICROALBUMIN  01/31/2015  . FOOT EXAM  02/18/2015  . OPHTHALMOLOGY EXAM  05/19/2015  . MAMMOGRAM  06/12/2016  . COLONOSCOPY  03/08/2020  . TETANUS/TDAP  01/12/2022  . DEXA SCAN  Completed  . ZOSTAVAX  Completed  . PNA vac Low Risk Adult  Completed   Immunization History  Administered Date(s) Administered  . Influenza, High Dose Seasonal PF 01/30/2014, 02/14/2015  . Influenza-Unspecified 02/05/2013  . Pneumococcal Conjugate-13 01/30/2014  . Pneumococcal Polysaccharide-23 01/07/2011  . Td 01/13/2012  . Zoster 01/13/2012   Past Surgical History  Procedure Laterality Date  . Cataract extraction w/ intraocular lens  implant, bilateral  2000's  . Finger surgery Right     "index finger; turned out to be gout" (08/27/2012)  . Coronary artery bypass graft  2011    "CABG X4" (08/27/2012)  . Dental surgery  04/30/11    "1 implant" (08/27/2012)  . Amputation ray Right 08/27/2012    5th ray/notes 08/27/2012  . Cardiac catheterization  05/24/2009    Archie Endo 05/24/2009 (08/27/2012)  . Amputation Right 08/27/2012    Procedure: AMPUTATION RAY;  Surgeon: Newt Minion, MD;  Location: [facility];  Service: Orthopedics;  Laterality: Right;  Right Foot 5th Ray Amputation  . Eye surgery    . Colonoscopy w/ biopsies and polypectomy  2010  . Breast lumpectomy  04/20/2013    with biopsy      DR WAKEFIELD  . Breast  lumpectomy with needle localization and axillary sentinel lymph node bx Left 04/20/2013    Procedure: LEFT BREAST WIRE GUIDED LUMPECTOMY AND AXILLARY SENTINEL LYMPH NODE BX;  Surgeon: Rolm Bookbinder, MD;  Location: [facility];  Service: General;  Laterality: Left;  . Dilation and curettage of uterus    . Amputation Left 07/08/2013    Procedure: AMPUTATION RAY;  Surgeon: Newt Minion, MD;  Location: [facility];  Service: Orthopedics;  Laterality: Left;  Left Foot 2nd Ray Amputation   Family History  Problem Relation Age of Onset  . Kidney cancer Brother 35  . Hypertension Brother   . Breast cancer Sister 58    LCIS; BRCA negative  . Throat cancer Father  76    smoker  . Breast cancer Maternal Aunt     dx in her 30s  . Breast cancer Sister 95    Lobular breast cancer   Social History  Substance Use Topics  . Smoking status: Never Smoker   . Smokeless tobacco: Never Used  . Alcohol Use: No     Comment: rarely drinks wine    ROS Constitutional: Denies fever, chills, weight loss/gain, headaches, insomnia,  night sweats, and change in appetite. Does c/o fatigue. Eyes: Denies redness, blurred vision, diplopia, discharge, itchy, watery eyes.  ENT: Denies discharge, congestion, post nasal drip, epistaxis, sore throat, earache, hearing loss, dental pain, Tinnitus, Vertigo, Sinus pain, snoring.  Cardio: Denies chest pain, palpitations, irregular heartbeat, syncope, dyspnea, diaphoresis, orthopnea, PND, claudication, edema Respiratory: denies cough, dyspnea, DOE, pleurisy, hoarseness, laryngitis, wheezing.  Gastrointestinal: Denies dysphagia, heartburn, reflux, water brash, pain, cramps, nausea, vomiting, bloating, diarrhea, constipation, hematemesis, melena, hematochezia, jaundice, hemorrhoids Genitourinary: Denies dysuria, frequency, urgency, nocturia, hesitancy, discharge, hematuria, flank pain Breast: Breast lumps, nipple discharge, bleeding.  Musculoskeletal: Denies arthralgia, myalgia, stiffness,  Jt. Swelling, pain, limp, and strain/sprain. Denies falls. Skin: Denies puritis, rash, hives, warts, acne, eczema, changing in skin lesion Neuro: No weakness, tremor, incoordination, spasms or  Pain but c/o intermittent paresthesias of the feet, Psychiatric: Denies confusion, memory loss, sensory loss. Denies Depression. Endocrine: Denies change in weight, skin, hair change, nocturia, and paresthesia, diabetic polys, visual blurring, hyper / hypo glycemic episodes.  Heme/Lymph: No excessive bleeding, bruising, enlarged lymph nodes.  Physical Exam  BP 138/84 mmHg  Pulse 88  Temp(Src) 98 F (36.7 C) (Temporal)  Resp 18  Ht '5\' 5"'  (1.651 m)  Wt 176 lb (79.833 kg)  BMI 29.29 kg/m2  General Appearance: Well nourished and in no apparent distress. Eyes: PERRLA, EOMs, conjunctiva no swelling or erythema, normal fundi and vessels. Sinuses: No frontal/maxillary tenderness ENT/Mouth: EACs patent / TMs  nl. Nares clear without erythema, swelling, mucoid exudates. Oral hygiene is good. No erythema, swelling, or exudate. Tongue normal, non-obstructing. Tonsils not swollen or erythematous. Hearing normal.  Neck: Supple, thyroid normal. No bruits, nodes or JVD. Respiratory: Respiratory effort normal.  BS equal and clear bilateral without rales, rhonci, wheezing or stridor. Cardio: Heart sounds are normal with regular rate and rhythm and no murmurs, rubs or gallops. Peripheral pulses are normal and equal bilaterally without edema. No aortic or femoral bruits. Chest: symmetric with normal excursions and percussion. Breasts: Symmetric, without lumps, nipple discharge, retractions, or fibrocystic changes.  Abdomen: Flat, soft, with bowl sounds. Nontender, no guarding, rebound, hernias, masses, or organomegaly.  Lymphatics: Non tender without lymphadenopathy.  Genitourinary:  Musculoskeletal: Full ROM all peripheral extremities, joint stability, 5/5 strength, and normal gait. Skin: Warm and dry without  rashes, lesions, cyanosis, clubbing or  ecchymosis.  Neuro: Cranial nerves intact, DTR's in UE Nl and KJ' & AJ's absent.  Normal muscle tone, no cerebellar symptoms. Sensation intact by monofilament , touch and vibratory testing to the toes bilaterally. Marland Kitchen  Pysch: Awake and oriented X 3, normal affect, Insight and Judgment appropriate.   Assessment and Plan   1. Essential hypertension  - EKG 12-Lead - [name], RETROPERITNL ABD,  LTD - TSH  2. Hyperlipidemia  - Lipid panel  3. Controlled type 1 diabetes mellitus with stage 3 chronic kidney disease (HCC)  - Microalbumin / creatinine urine ratio - HM DIABETES FOOT EXAM - LOW EXTREMITY NEUR EXAM DOCUM - Hemoglobin A1c - Insulin, random  4. Vitamin D deficiency  -  Vit D  25 hydroxy   5. Atherosclerosis of native coronary artery of native heart without angina pectoris   6. Gouty arthritis  - Uric acid  7. Screening for rectal cancer  - POC Hemoccult Bld/Stl  8. Depression screen   9. At low risk for fall   10. Need for prophylactic vaccination and inoculation against influenza  - Flu vaccine HIGH DOSE PF (Fluzone High dose)  11. Medication management  - Urinalysis, Routine w reflex microscopic  - CBC with Differential/Platelet - BASIC METABOLIC PANEL WITH GFR - Hepatic function panel - Magnesium  12. BMI 29.0-29.9,adult   13. Uncontrolled type 2 diabetes mellitus with diabetic neuropathy, without long-term current use of insulin ([place])     Continue prudent diet as discussed, weight control, BP monitoring, regular exercise, and medications. Discussed med's effects and SE's. Screening labs and tests as requested with regular follow-up as recommended.

## 2015-02-15 ENCOUNTER — Other Ambulatory Visit: Payer: Self-pay | Admitting: Internal Medicine

## 2015-02-15 DIAGNOSIS — N3090 Cystitis, unspecified without hematuria: Secondary | ICD-10-CM

## 2015-02-15 LAB — HEPATIC FUNCTION PANEL
ALK PHOS: 65 U/L (ref 33–130)
ALT: 14 U/L (ref 6–29)
AST: 16 U/L (ref 10–35)
Albumin: 3.6 g/dL (ref 3.6–5.1)
BILIRUBIN INDIRECT: 0.3 mg/dL (ref 0.2–1.2)
Bilirubin, Direct: 0.1 mg/dL (ref <=0.2)
TOTAL PROTEIN: 6.9 g/dL (ref 6.1–8.1)
Total Bilirubin: 0.4 mg/dL (ref 0.2–1.2)

## 2015-02-15 LAB — URINALYSIS, ROUTINE W REFLEX MICROSCOPIC
Bilirubin Urine: NEGATIVE
Ketones, ur: NEGATIVE
Nitrite: NEGATIVE
Specific Gravity, Urine: 1.008 (ref 1.001–1.035)
pH: 6 (ref 5.0–8.0)

## 2015-02-15 LAB — BASIC METABOLIC PANEL WITH GFR
BUN: 45 mg/dL — ABNORMAL HIGH (ref 7–25)
CALCIUM: 10.1 mg/dL (ref 8.6–10.4)
CHLORIDE: 98 mmol/L (ref 98–110)
CO2: 25 mmol/L (ref 20–31)
Creat: 1.79 mg/dL — ABNORMAL HIGH (ref 0.50–0.99)
GFR, EST NON AFRICAN AMERICAN: 29 mL/min — AB (ref >=60)
GFR, Est African American: 33 mL/min — ABNORMAL LOW (ref >=60)
GLUCOSE: 164 mg/dL — AB (ref 65–99)
Potassium: 4.5 mmol/L (ref 3.5–5.3)
SODIUM: 135 mmol/L (ref 135–146)

## 2015-02-15 LAB — URINALYSIS, MICROSCOPIC ONLY
CASTS: NONE SEEN [LPF]
Crystals: NONE SEEN [HPF]
RBC / HPF: NONE SEEN RBC/HPF (ref <=2)
Yeast: NONE SEEN [HPF]

## 2015-02-15 LAB — HEMOGLOBIN A1C
HEMOGLOBIN A1C: 11.6 % — AB (ref <5.7)
MEAN PLASMA GLUCOSE: 286 mg/dL — AB (ref <117)

## 2015-02-15 LAB — MAGNESIUM: Magnesium: 2 mg/dL (ref 1.5–2.5)

## 2015-02-15 LAB — MICROALBUMIN / CREATININE URINE RATIO
CREATININE, URINE: 21 mg/dL (ref 20–320)
MICROALB UR: 107.7 mg/dL
MICROALB/CREAT RATIO: 5129 ug/mg{creat} — AB (ref <30)

## 2015-02-15 LAB — LIPID PANEL
CHOLESTEROL: 122 mg/dL — AB (ref 125–200)
HDL: 24 mg/dL — ABNORMAL LOW (ref >=46)
LDL CALC: 27 mg/dL (ref <130)
Total CHOL/HDL Ratio: 5.1 Ratio — ABNORMAL HIGH (ref <=5.0)
Triglycerides: 353 mg/dL — ABNORMAL HIGH (ref <150)
VLDL: 71 mg/dL — AB (ref <30)

## 2015-02-15 LAB — URIC ACID: Uric Acid, Serum: 6.2 mg/dL (ref 2.4–7.0)

## 2015-02-15 LAB — VITAMIN D 25 HYDROXY (VIT D DEFICIENCY, FRACTURES): Vit D, 25-Hydroxy: 32 ng/mL (ref 30–100)

## 2015-02-15 LAB — INSULIN, RANDOM: Insulin: 38.5 u[IU]/mL — ABNORMAL HIGH (ref 2.0–19.6)

## 2015-02-15 MED ORDER — CIPROFLOXACIN HCL 250 MG PO TABS: 250.0000 mg | ORAL_TABLET | Freq: Two times a day (BID) | ORAL | Status: AC

## 2015-02-21 ENCOUNTER — Other Ambulatory Visit: Payer: Self-pay | Admitting: Physician Assistant

## 2015-03-07 ENCOUNTER — Ambulatory Visit: Payer: Medicare Other | Admitting: Podiatry

## 2015-03-07 ENCOUNTER — Other Ambulatory Visit: Payer: Self-pay | Admitting: Oncology

## 2015-03-13 ENCOUNTER — Ambulatory Visit (INDEPENDENT_AMBULATORY_CARE_PROVIDER_SITE_OTHER): Payer: Medicare Other | Admitting: Internal Medicine

## 2015-03-13 ENCOUNTER — Encounter: Payer: Self-pay | Admitting: Internal Medicine

## 2015-03-13 VITALS — BP 148/76 | HR 52 | Temp 97.3°F | Resp 16 | Ht 65.0 in | Wt 177.6 lb

## 2015-03-13 DIAGNOSIS — D485 Neoplasm of uncertain behavior of skin: Secondary | ICD-10-CM | POA: Diagnosis not present

## 2015-03-13 DIAGNOSIS — I2510 Atherosclerotic heart disease of native coronary artery without angina pectoris: Secondary | ICD-10-CM | POA: Diagnosis not present

## 2015-03-13 DIAGNOSIS — D225 Melanocytic nevi of trunk: Secondary | ICD-10-CM | POA: Diagnosis not present

## 2015-03-13 DIAGNOSIS — I10 Essential (primary) hypertension: Secondary | ICD-10-CM

## 2015-03-13 NOTE — Progress Notes (Signed)
  Subjective:    Patient ID: [name], female    DOB: [birth year], 69 y.o.   MRN: [id number]  HPI Patient presents for BP recheck and has concerns re: dark skin lesion of upper R back. WRT sl elevated BP today, patient denies any sx's of HA, dizziness, CP, palpitations, dyspnea, or dependent edema.   Meds, Allergies, PMHx reviewed.   Review of Systems 10 point systems review negative except as above.    Objective:   Physical Exam  BP 148/76 mmHg  Pulse 52  Temp(Src) 97.3 F (36.3 C)  Resp 16  Ht 5\' 5"  (1.651 m)  Wt 177 lb 9.6 oz (80.559 kg)  BMI 29.55 kg/m2   Procedure (CPT 11300)   There is a 4 mm x 4 mm sl irregular flat to sl raised light & dark brown skin lesion with irregular margins located in the upper mid R back area.  After informed consent and aseptic prep with alcohol,  Lesion site was locally anesthetizes with 0.6 ml of Marcaine 0.5% w/epi. Then with a #10 scalpel with a 2 mm margin the lesion was excised and the biopsy site was deeply hyfrecated for electro destruction of any residual lesion fragments. Then antibiotic ung was applied and covered with a 2 " x 3" Tegaderm bandage. Patient was instructed in wound care. Lesion was sent for path analysis.     Assessment & Plan:   1. Essential hypertension   2. Neoplasm of uncertain behavior of skin  - Dermatology pathology

## 2015-03-22 ENCOUNTER — Other Ambulatory Visit: Payer: Self-pay | Admitting: *Deleted

## 2015-03-22 MED ORDER — GLUCOSE BLOOD VI STRP: ORAL_STRIP | Status: DC

## 2015-03-26 ENCOUNTER — Other Ambulatory Visit: Payer: Self-pay | Admitting: Internal Medicine

## 2015-04-02 ENCOUNTER — Encounter: Payer: Self-pay | Admitting: *Deleted

## 2015-04-05 ENCOUNTER — Ambulatory Visit (INDEPENDENT_AMBULATORY_CARE_PROVIDER_SITE_OTHER): Payer: Medicare Other | Admitting: Internal Medicine

## 2015-04-05 ENCOUNTER — Encounter: Payer: Self-pay | Admitting: Internal Medicine

## 2015-04-05 VITALS — HR 98 | Temp 98.0°F | Resp 18 | Ht 65.0 in | Wt 178.0 lb

## 2015-04-05 DIAGNOSIS — E1140 Type 2 diabetes mellitus with diabetic neuropathy, unspecified: Secondary | ICD-10-CM

## 2015-04-05 DIAGNOSIS — R6889 Other general symptoms and signs: Secondary | ICD-10-CM | POA: Diagnosis not present

## 2015-04-05 DIAGNOSIS — I739 Peripheral vascular disease, unspecified: Secondary | ICD-10-CM | POA: Diagnosis not present

## 2015-04-05 DIAGNOSIS — N183 Chronic kidney disease, stage 3 unspecified: Secondary | ICD-10-CM

## 2015-04-05 DIAGNOSIS — I2510 Atherosclerotic heart disease of native coronary artery without angina pectoris: Secondary | ICD-10-CM

## 2015-04-05 DIAGNOSIS — C50212 Malignant neoplasm of upper-inner quadrant of left female breast: Secondary | ICD-10-CM

## 2015-04-05 DIAGNOSIS — E559 Vitamin D deficiency, unspecified: Secondary | ICD-10-CM | POA: Diagnosis not present

## 2015-04-05 DIAGNOSIS — J438 Other emphysema: Secondary | ICD-10-CM | POA: Diagnosis not present

## 2015-04-05 DIAGNOSIS — Z0001 Encounter for general adult medical examination with abnormal findings: Secondary | ICD-10-CM | POA: Diagnosis not present

## 2015-04-05 DIAGNOSIS — E1165 Type 2 diabetes mellitus with hyperglycemia: Secondary | ICD-10-CM

## 2015-04-05 DIAGNOSIS — I10 Essential (primary) hypertension: Secondary | ICD-10-CM | POA: Diagnosis not present

## 2015-04-05 DIAGNOSIS — I509 Heart failure, unspecified: Secondary | ICD-10-CM | POA: Diagnosis not present

## 2015-04-05 DIAGNOSIS — Z6829 Body mass index (BMI) 29.0-29.9, adult: Secondary | ICD-10-CM | POA: Diagnosis not present

## 2015-04-05 DIAGNOSIS — Z79899 Other long term (current) drug therapy: Secondary | ICD-10-CM

## 2015-04-05 DIAGNOSIS — IMO0002 Reserved for concepts with insufficient information to code with codable children: Secondary | ICD-10-CM

## 2015-04-05 DIAGNOSIS — E782 Mixed hyperlipidemia: Secondary | ICD-10-CM | POA: Diagnosis not present

## 2015-04-05 NOTE — Progress Notes (Signed)
Patient ID: Dyneshia Baccam, female   DOB: [birth year], 69 y.o.   MRN: [id number]  MEDICARE ANNUAL WELLNESS VISIT AND FOLLOW UP  Assessment:    1. Essential hypertension -elevated today -DASH diet -monitor at home -cont meds   2. Atherosclerosis of native coronary artery of native heart without angina pectoris -followed by cards  3. Congestive heart failure, unspecified congestive heart failure chronicity, unspecified congestive heart failure type ([place]) -followed by cards -cont meds  4. Peripheral vascular disease ([place]) -cont monitor with cards  5. EMPHYSEMA -cont  6. Uncontrolled type 2 diabetes mellitus with diabetic neuropathy, without long-term current use of insulin (HCC) -cont A1C surveillance -cont meds  7. CKD (chronic kidney disease) stage 3, GFR 30-59 ml/min -cont avoidance of   8. Hyperlipidemia -cont statin  9. Breast cancer of upper-inner quadrant of left female breast ([place])   10. Vitamin D deficiency   11. Medication management   12. BMI 29.0-29.9,adult -cont diet and exercise     Over 30 minutes of exam, counseling, chart review, and critical decision making was performed  Plan:   During the course of the visit the patient was educated and counseled about appropriate screening and preventive services including:    Pneumococcal vaccine   Influenza vaccine  Td vaccine  Prevnar 13  Screening electrocardiogram  Screening mammography  Bone densitometry screening  Colorectal cancer screening  Diabetes screening  Glaucoma screening  Nutrition counseling   Advanced directives: given info/requested copies  Conditions/risks identified: Diabetes is at goal, ACE/ARB therapy: Yes. Urinary Incontinence is not an issue: discussed non pharmacology and pharmacology options.  Fall risk: low- discussed PT, home fall assessment, medications.    Subjective:   Alexea Blase is a 69 y.o. female who presents for Medicare Annual Wellness  Visit and 3 month follow up on hypertension, diabetes, hyperlipidemia, vitamin D def.  Date of last medicare wellness visit is unknown.   Her blood pressure has been controlled at home, today their BP is   She does not workout. She denies chest pain, shortness of breath, dizziness.   She is on cholesterol medication and denies myalgias. Her cholesterol is at goal. The cholesterol last visit was:   Lab Results  Component Value Date   CHOL 122* 02/14/2015   HDL 24* 02/14/2015   LDLCALC 27 02/14/2015   TRIG 353* 02/14/2015   CHOLHDL 5.1* 02/14/2015   She has not been working on diet and exercise for prediabetes, and denies foot ulcerations, hyperglycemia, hypoglycemia , increased appetite, nausea, paresthesia of the feet, polydipsia, polyuria, visual disturbances, vomiting and weight loss. Last A1C in the office was:  Lab Results  Component Value Date   HGBA1C 11.6* 02/14/2015   Last GFR NonAA   Lab Results  Component Value Date   GFRNONAA 29* 02/14/2015   AA  Lab Results  Component Value Date   GFRAA 33* 02/14/2015   Patient is on Vitamin D supplement. Lab Results  Component Value Date   VD25OH 32 02/14/2015      Medication Review Current Outpatient Prescriptions on File Prior to Visit  Medication Sig Dispense Refill  . allopurinol (ZYLOPRIM) 100 MG tablet TAKE 1 TABLET TWICE DAILY 180 tablet 1  . anastrozole (ARIMIDEX) 1 MG tablet TAKE 1 TABLET EVERY DAY 90 tablet 3  . aspirin 325 MG tablet Take 325 mg by mouth at bedtime.     Marland Kitchen aspirin-acetaminophen-caffeine (EXCEDRIN MIGRAINE) 250-250-65 MG per tablet Take 1-2 tablets by mouth every 6 (six) hours as needed (sinus headache).    Marland Kitchen  BD PEN NEEDLE NANO U/F 32G X 4 MM MISC USE TO INJECT TWICE DAILY LANTUS INJECTIONS 180 each 1  . carvedilol (COREG) 12.5 MG tablet TAKE 1 TABLET TWICE DAILY FOR BLOOD PRESSURE 180 tablet 3  . COLCRYS 0.6 MG tablet Take 0.6 mg by mouth daily as needed (for gout flareups).     Marland Kitchen glucose blood  test strip Check sugars 3x a day-DX-E10.22 200 each 11  . hydrochlorothiazide (HYDRODIURIL) 12.5 MG tablet TAKE 1 TABLET EVERY DAY 90 tablet 1  . insulin lispro (HUMALOG KWIKPEN) 100 UNIT/ML KiwkPen Inject 0.05-0.07 mLs (5-7 Units total) into the skin 3 (three) times daily. (Patient taking differently: Inject 10 Units into the skin daily. ) 15 mL 3  . LANTUS SOLOSTAR 100 UNIT/ML Solostar Pen INJECT  50 UNITS SUBCUTANEOUSLY EVERY DAY  OR AS DIRECTED (DISCARD AND BEGIN NEW PEN VIAL EVERY 28 DAYS) (Patient taking differently: INJECT  35 UNITS SUBCUTANEOUSLY EVERY DAY  OR AS DIRECTED (DISCARD AND BEGIN NEW PEN VIAL EVERY 28 DAYS)) 45 mL 0  . Melatonin 5 MG TABS Take 5 mg by mouth at bedtime as needed (for sleep).    . metFORMIN (GLUCOPHAGE-XR) 500 MG 24 hr tablet TAKE 2 TABLETS TWICE DAILY AFTER MEALS FOR DIABETES 360 tablet 3  . Misc. Devices MISC Custom molded inserts for diabetic shoes - ICD10: E11.41. Detailed foot exam performed at the time of the visit on 02/17/2014. Patient has neuropathy, multiple digit amputations and calluses. 2 each 1  . mupirocin ointment (BACTROBAN) 2 %     . OVER THE COUNTER MEDICATION 2 tablets 1 day or 1 dose. CinSulin (True Nature) Vitamin D, Cinnamon bark and Chromium    . oxyCODONE (OXY IR/ROXICODONE) 5 MG immediate release tablet Take 1 tablet (5 mg total) by mouth every 4 (four) hours as needed for moderate pain. 30 tablet 0  . Polyvinyl Alcohol-Povidone (REFRESH OP) Place 1 drop into both eyes daily as needed (dry eyes).    . rosuvastatin (CRESTOR) 20 MG tablet TAKE 1 TABLET  DAILY 90 tablet 1  . valsartan (DIOVAN) 320 MG tablet TAKE 1 TABLET EVERY MORNING FOR BLOOD PRESSURE 90 tablet 1   No current facility-administered medications on file prior to visit.    Current Problems (verified) Patient Active Problem List   Diagnosis Date Noted  . BMI 29.0-29.9,adult 02/14/2015  . Medication management 08/04/2014  . Type II diabetes mellitus with neurological  manifestations, uncontrolled ([place]) 02/17/2014  . Gouty arthritis   . Vitamin D deficiency   . CKD (chronic kidney disease) stage 3, GFR 30-59 ml/min 06/04/2013  . Breast cancer of upper-inner quadrant of left female breast ([place]) 02/14/2013  . Peripheral vascular disease ([place]) 09/05/2009  . Hyperlipidemia 06/20/2009  . Essential hypertension 06/20/2009  . Coronary atherosclerosis 06/20/2009  . Congestive heart failure ([place]) 06/20/2009  . EMPHYSEMA 06/20/2009  . Asthma 06/20/2009    Screening Tests Immunization History  Administered Date(s) Administered  . Influenza, High Dose Seasonal PF 01/30/2014, 02/14/2015  . Influenza-Unspecified 02/05/2013  . Pneumococcal Conjugate-13 01/30/2014  . Pneumococcal Polysaccharide-23 01/07/2011  . Td 01/13/2012  . Zoster 01/13/2012    Preventative care: Last colonoscopy: 2011 Last mammogram: 2016  [id number]  Prior vaccinations: TD or Tdap: 2013  Influenza: 02/2015  Pneumococcal: 2012 Prevnar13: 2015 Shingles/Zostavax: 2013  Names of Other Physician/Practitioners you currently use: 1. [facility]- here for primary care 2. Dr. Bing Plume, eye doctor, last visit 2016 3. Dr. Satira Sark, Dr. Mallie Snooks , dentist, last visit  2016 Patient Care Team: Unk Pinto, MD as PCP - General (Internal Medicine) Chauncey Cruel, MD as Consulting Physician (Oncology) Kyung Rudd, MD as Consulting Physician (Radiation Oncology) Newt Minion, MD as Consulting Physician (Orthopedic Surgery) Philemon Kingdom, MD as Consulting Physician (Internal Medicine) Josue Hector, MD as Consulting Physician (Cardiology) Inda Castle, MD as Consulting Physician (Gastroenterology) Rolm Bookbinder, MD as Consulting Physician (General Surgery) Calvert Cantor, MD as Consulting Physician (Ophthalmology)  Past Surgical History  Procedure Laterality Date  . Cataract extraction w/ intraocular lens  implant, bilateral  2000's  .  Finger surgery Right     "index finger; turned out to be gout" (08/27/2012)  . Coronary artery bypass graft  2011    "CABG X4" (08/27/2012)  . Dental surgery  04/30/11    "1 implant" (08/27/2012)  . Amputation ray Right 08/27/2012    5th ray/notes 08/27/2012  . Cardiac catheterization  05/24/2009    Archie Endo 05/24/2009 (08/27/2012)  . Amputation Right 08/27/2012    Procedure: AMPUTATION RAY;  Surgeon: Newt Minion, MD;  Location: [facility];  Service: Orthopedics;  Laterality: Right;  Right Foot 5th Ray Amputation  . Eye surgery    . Colonoscopy w/ biopsies and polypectomy  2010  . Breast lumpectomy  04/20/2013    with biopsy      DR WAKEFIELD  . Breast lumpectomy with needle localization and axillary sentinel lymph node bx Left 04/20/2013    Procedure: LEFT BREAST WIRE GUIDED LUMPECTOMY AND AXILLARY SENTINEL LYMPH NODE BX;  Surgeon: Rolm Bookbinder, MD;  Location: [facility];  Service: General;  Laterality: Left;  . Dilation and curettage of uterus    . Amputation Left 07/08/2013    Procedure: AMPUTATION RAY;  Surgeon: Newt Minion, MD;  Location: [facility];  Service: Orthopedics;  Laterality: Left;  Left Foot 2nd Ray Amputation   Family History  Problem Relation Age of Onset  . Kidney cancer Brother 40  . Hypertension Brother   . Breast cancer Sister 50    LCIS; BRCA negative  . Throat cancer Father 62    smoker  . Breast cancer Maternal Aunt     dx in her 55s  . Breast cancer Sister 36    Lobular breast cancer   Social History  Substance Use Topics  . Smoking status: Never Smoker   . Smokeless tobacco: Never Used  . Alcohol Use: No     Comment: rarely drinks wine    MEDICARE WELLNESS OBJECTIVES: Tobacco use: She does not smoke.  Patient is not a former smoker. If yes, counseling given Alcohol Current alcohol use: none Osteoporosis: postmenopausal estrogen deficiency, History of fracture in the past year: no Fall risk: Minimal risk Hearing: impaired Visual acuity: normal,  does perform  annual eye exam Diet: well balanced Physical activity:   Cardiac risk factors:   Depression/mood screen:   Depression screen [facility] 2/9 04/05/2015  Decreased Interest 0  Down, Depressed, Hopeless 0  PHQ - 2 Score 0    ADLs:  In your present state of health, do you have any difficulty performing the following activities: 02/14/2015  Hearing? N  Vision? N  Difficulty concentrating or making decisions? N  Walking or climbing stairs? N  Dressing or bathing? N  Doing errands, shopping? N     Cognitive Testing  Alert? Yes  Normal Appearance?Yes  Oriented to person? Yes  Place? Yes   Time? Yes  Recall of three objects?  Yes  Can perform simple calculations? Yes  Displays appropriate judgment?Yes  Can read the correct time from a watch face?Yes  EOL planning: Does patient have an advance directive?: Yes Type of Advance Directive: Healthcare Power of Attorney, Living will Does patient want to make changes to advanced directive?: No - Patient declined Copy of advanced directive(s) in chart?: Yes   Objective:   Today's Vitals   04/05/15 1417  Pulse: 98  Temp: 98 F (36.7 C)  TempSrc: Temporal  Resp: 18  Height: '5\' 5"'  (1.651 m)  Weight: 178 lb (80.74 kg)   Body mass index is 29.62 kg/(m^2).  General appearance: alert, no distress, WD/WN,  female HEENT: normocephalic, sclerae anicteric, TMs pearly, nares patent, no discharge or erythema, pharynx normal Oral cavity: MMM, no lesions Neck: supple, no lymphadenopathy, no thyromegaly, no masses Heart: RRR, normal S1, S2, no murmurs Lungs: CTA bilaterally, no wheezes, rhonchi, or rales Abdomen: +bs, soft, non tender, non distended, no masses, no hepatomegaly, no splenomegaly Musculoskeletal: nontender, no swelling, no obvious deformity Extremities: no edema, no cyanosis, no clubbing Pulses: 2+ symmetric, upper and lower extremities, normal cap refill Neurological: alert, oriented x 3, CN2-12 intact, strength normal upper  extremities and lower extremities, sensation normal throughout, DTRs 2+ throughout, no cerebellar signs, gait normal Psychiatric: normal affect, behavior normal, pleasant  Breast: defer Gyn: defer Rectal: defer   Medicare Attestation I have personally reviewed: The patient's medical and social history Their use of alcohol, tobacco or illicit drugs Their current medications and supplements The patient's functional ability including ADLs,fall risks, home safety risks, cognitive, and hearing and visual impairment Diet and physical activities Evidence for depression or mood disorders  The patient's weight, height, BMI, and visual acuity have been recorded in the chart.  I have made referrals, counseling, and provided education to the patient based on review of the above and I have provided the patient with a written personalized care plan for preventive services.     Starlyn Skeans, PA-C   04/05/2015

## 2015-04-06 ENCOUNTER — Encounter: Payer: Self-pay | Admitting: *Deleted

## 2015-04-06 ENCOUNTER — Encounter: Payer: Self-pay | Admitting: Internal Medicine

## 2015-04-06 ENCOUNTER — Ambulatory Visit (INDEPENDENT_AMBULATORY_CARE_PROVIDER_SITE_OTHER): Payer: Medicare Other | Admitting: Internal Medicine

## 2015-04-06 VITALS — BP 132/78 | HR 80 | Temp 97.5°F | Resp 16 | Ht 65.0 in | Wt 178.0 lb

## 2015-04-06 DIAGNOSIS — I10 Essential (primary) hypertension: Secondary | ICD-10-CM

## 2015-04-06 DIAGNOSIS — D235 Other benign neoplasm of skin of trunk: Secondary | ICD-10-CM

## 2015-04-06 DIAGNOSIS — I2510 Atherosclerotic heart disease of native coronary artery without angina pectoris: Secondary | ICD-10-CM | POA: Diagnosis not present

## 2015-04-06 DIAGNOSIS — D225 Melanocytic nevi of trunk: Secondary | ICD-10-CM

## 2015-04-06 DIAGNOSIS — L905 Scar conditions and fibrosis of skin: Secondary | ICD-10-CM | POA: Diagnosis not present

## 2015-04-06 NOTE — Progress Notes (Signed)
Subjective:    Patient ID: [name], female    DOB: July 20, 1945, 69 y.o.   MRN: [id number]  HPI   This nice 69 yo MWF presents for BP check and denies sx's of HA, dizziness, CP, palpitations, dyspnea or dependent edema. She had reent bx/o a suspect lesion of her back with path showing an atypical dysplastic with one involved margin and needed wider resection.   Medication Sig  . allopurinol  100 MG tablet TAKE 1 TABLET TWICE DAILY  . anastrozole (ARIMIDEX) 1 MG tablet TAKE 1 TABLET EVERY DAY  . aspirin 325 MG tablet Take 325 mg by mouth at bedtime.   Marland Kitchen EXCEDRIN MIGRAINE 250-250-65  Take 1-2 tablets by mouth every 6 (six) hours as needed (sinus headache).  . carvedilol 12.5 MG tablet TAKE 1 TABLET TWICE DAILY FOR BLOOD PRESSURE  . COLCRYS 0.6 MG tablet Take 0.6 mg by mouth daily as needed (for gout flareups).   Marland Kitchen glimepiride  4 MG tablet Take 4 mg by mouth daily with breakfast.  . hyctz 12.5 MG tablet TAKE 1 TABLET EVERY DAY  . HUMALOG KWIKPEN Inject 0.05-0.07 mLs (5-7 Units total) into the skin 3 (three) times daily. (Patient taking differently: Inject 10 Units into the skin daily. )  . LANTUS SOLOSTAR 100 UNIT/ML  INJECT  50 UNITS SUBCUTANEOUSLY EVERY DAY  OR AS DIRECTED  . Melatonin 5 MG TABS Take 5 mg by mouth at bedtime as needed (for sleep).  . metFORMIN-XR 500 MG 24 hr tablet TAKE 2 TABLETS TWICE DAILY AFTER MEALS FOR DIABETES  . OVER THE COUNTER MEDICATION 2 tablets 1 day or 1 dose. CinSulin (True Nature) Vitamin D, Cinnamon bark and Chromium  . REFRESH OP) Place 1 drop into both eyes daily as needed (dry eyes).  . Rosuvastatin 20 MG tablet TAKE 1 TABLET  DAILY  . valsartan 320 MG tablet TAKE 1 TABLET EVERY MORNING FOR BLOOD PRESSURE  . BACTROBAN 2 %   . oxyCODONE  5 MG IR  Take 1 tablet (5 mg total) by mouth every 4 (four) hours as needed for moderate pain.   Allergies  Allergen Reactions  . Atenolol Other (See Comments)    Exacerbates gout  . Contrast Media [Iodinated  Diagnostic Agents] Rash  . Latex Rash  . Omnipaque [Iohexol] Rash   Past Medical History  Diagnosis Date  . Hypertension   . Coronary artery disease     NSTEMI in 05/2009;  LHC with 2 v CAD - > CABG (LIMA-LAD, SVG-diagonal, SVG-OM1/OM 2).   . Hyperlipidemia   . Ischemic cardiomyopathy     Echo 07/2009: Mild LVH, EF 40-45%, inferior and posterior HK, grade 1 diastolic dysfunction, moderate MAC, mild to moderate MR, mild LAE  . PAD (peripheral artery disease) ([place])   . Family history of anesthesia complication     Mom has a hard time to wake up  . Neuropathy (HCC)     Hx; of B/L feet  . Type II diabetes mellitus ([place])   . Foot ulcer ([place])     "I've had them on both feet" (08/27/2012)  . Osteomyelitis of foot ([place])   . Sinus headache     "occasionally" (08/27/2012)  . CKD (chronic kidney disease), stage I   . 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 Allergy   . NSTEMI (non-ST elevated myocardial infarction) ([place]) 05/23/2009    Archie Endo 05/23/2009 (08/27/2012)  . History of radiation therapy 06/09/13-07/06/13  left breast 50Gy  . Type II or unspecified type diabetes mellitus without mention of complication, not stated as uncontrolled   . Arthritis     "maybe in my lower back" (08/27/2012)  . Gouty arthritis     "right index finger" (08/27/2012)  . Vitamin D deficiency   . Type II diabetes mellitus with neurological manifestations, uncontrolled ([place]) 02/17/2014   Review of Systems     Objective:   Physical Exam  BP 132/78 mmHg  Pulse 80  Temp(Src) 97.5 F (36.4 C)  Resp 16  Ht 5\' 5"  (1.651 m)  Wt 178 lb (80.74 kg)  BMI 29.62 kg/m2  Bx site of Rt upper backs show a dry crusted eschar approx 1 cm diameter  Procedure (CPT -11400 ) After informed consent and aseptic prep the area was anesthetized with 1 cc marcaine 0.5% w/epi. Then with a #10 scalpel the above areae was excised in an elliptical fashion with a 2 mm margin full  thickness to the deep sub-cut tissues. Then wound edges were sharply undermined and the approximated with # 4 interrupted vertical mattress sutures with 3-0 Nylon with good anatomic alignment. Antibiotic ointment on sterile 2 x 2" gauze was then secured by a 4" x ^" Tegaderm dressing and patient was instructed in po wound care.     Assessment & Plan:   1. Essential hypertension   2. Atypical nevus of back  - Dermatology pathology

## 2015-04-11 ENCOUNTER — Other Ambulatory Visit: Payer: Self-pay | Admitting: Internal Medicine

## 2015-04-12 ENCOUNTER — Other Ambulatory Visit: Payer: Self-pay | Admitting: *Deleted

## 2015-04-12 DIAGNOSIS — C50212 Malignant neoplasm of upper-inner quadrant of left female breast: Secondary | ICD-10-CM

## 2015-04-16 ENCOUNTER — Telehealth: Payer: Self-pay | Admitting: Oncology

## 2015-04-16 ENCOUNTER — Other Ambulatory Visit: Payer: Medicare Other

## 2015-04-16 ENCOUNTER — Ambulatory Visit: Payer: Medicare Other | Admitting: Oncology

## 2015-04-16 NOTE — Telephone Encounter (Signed)
Patient called in to reschedule her appointment due to weather °

## 2015-04-18 ENCOUNTER — Ambulatory Visit: Payer: Medicare Other | Admitting: Internal Medicine

## 2015-04-18 ENCOUNTER — Encounter: Payer: Self-pay | Admitting: Internal Medicine

## 2015-04-18 VITALS — BP 142/86 | HR 76 | Temp 97.0°F | Resp 16 | Ht 65.0 in | Wt 176.1 lb

## 2015-04-18 DIAGNOSIS — D225 Melanocytic nevi of trunk: Secondary | ICD-10-CM

## 2015-04-18 NOTE — Progress Notes (Signed)
Patient ID: [name], female   DOB: 05-15-1945, 70 y.o.   MRN: [id number]  # 5 sutures removed from back wound site of re-excision of a dysplastic nevus with repeat path showing no tumor residua. Wound well head with no signs of infection.

## 2015-04-23 ENCOUNTER — Other Ambulatory Visit: Payer: Self-pay | Admitting: *Deleted

## 2015-04-23 MED ORDER — GLIMEPIRIDE 4 MG PO TABS: 4.0000 mg | ORAL_TABLET | Freq: Every day | ORAL | Status: DC

## 2015-05-02 DIAGNOSIS — H524 Presbyopia: Secondary | ICD-10-CM | POA: Diagnosis not present

## 2015-05-02 DIAGNOSIS — H02403 Unspecified ptosis of bilateral eyelids: Secondary | ICD-10-CM | POA: Diagnosis not present

## 2015-05-03 ENCOUNTER — Encounter: Payer: Self-pay | Admitting: Gastroenterology

## 2015-05-08 ENCOUNTER — Ambulatory Visit (HOSPITAL_BASED_OUTPATIENT_CLINIC_OR_DEPARTMENT_OTHER): Payer: Medicare Other | Admitting: Oncology

## 2015-05-08 ENCOUNTER — Telehealth: Payer: Self-pay | Admitting: Oncology

## 2015-05-08 ENCOUNTER — Other Ambulatory Visit (HOSPITAL_BASED_OUTPATIENT_CLINIC_OR_DEPARTMENT_OTHER): Payer: Medicare Other

## 2015-05-08 VITALS — BP 180/79 | HR 92 | Temp 98.2°F | Resp 18 | Ht 65.0 in | Wt 176.0 lb

## 2015-05-08 DIAGNOSIS — C50912 Malignant neoplasm of unspecified site of left female breast: Secondary | ICD-10-CM

## 2015-05-08 DIAGNOSIS — N183 Chronic kidney disease, stage 3 unspecified: Secondary | ICD-10-CM

## 2015-05-08 DIAGNOSIS — C50212 Malignant neoplasm of upper-inner quadrant of left female breast: Secondary | ICD-10-CM

## 2015-05-08 DIAGNOSIS — Z794 Long term (current) use of insulin: Secondary | ICD-10-CM

## 2015-05-08 DIAGNOSIS — Z79811 Long term (current) use of aromatase inhibitors: Secondary | ICD-10-CM | POA: Diagnosis not present

## 2015-05-08 DIAGNOSIS — Z170 Estrogen receptor positive status [ER+]: Secondary | ICD-10-CM | POA: Diagnosis not present

## 2015-05-08 DIAGNOSIS — E1122 Type 2 diabetes mellitus with diabetic chronic kidney disease: Secondary | ICD-10-CM

## 2015-05-08 DIAGNOSIS — E119 Type 2 diabetes mellitus without complications: Secondary | ICD-10-CM

## 2015-05-08 DIAGNOSIS — N185 Chronic kidney disease, stage 5: Secondary | ICD-10-CM | POA: Insufficient documentation

## 2015-05-08 LAB — COMPREHENSIVE METABOLIC PANEL
ALBUMIN: 3.5 g/dL (ref 3.5–5.0)
ALK PHOS: 72 U/L (ref 40–150)
ALT: 20 U/L (ref 0–55)
ANION GAP: 11 meq/L (ref 3–11)
AST: 16 U/L (ref 5–34)
BILIRUBIN TOTAL: 0.31 mg/dL (ref 0.20–1.20)
BUN: 52.4 mg/dL — ABNORMAL HIGH (ref 7.0–26.0)
CALCIUM: 11.3 mg/dL — AB (ref 8.4–10.4)
CO2: 25 mEq/L (ref 22–29)
Chloride: 103 mEq/L (ref 98–109)
Creatinine: 2.2 mg/dL — ABNORMAL HIGH (ref 0.6–1.1)
EGFR: 23 mL/min/{1.73_m2} — AB (ref >90)
GLUCOSE: 252 mg/dL — AB (ref 70–140)
POTASSIUM: 5 meq/L (ref 3.5–5.1)
SODIUM: 138 meq/L (ref 136–145)
TOTAL PROTEIN: 7.2 g/dL (ref 6.4–8.3)

## 2015-05-08 LAB — CBC WITH DIFFERENTIAL/PLATELET
BASO%: 0.1 % (ref 0.0–2.0)
BASOS ABS: 0 10*3/uL (ref 0.0–0.1)
EOS ABS: 0.2 10*3/uL (ref 0.0–0.5)
EOS%: 2.5 % (ref 0.0–7.0)
HEMATOCRIT: 39.2 % (ref 34.8–46.6)
HEMOGLOBIN: 12.8 g/dL (ref 11.6–15.9)
LYMPH#: 2.4 10*3/uL (ref 0.9–3.3)
LYMPH%: 26.7 % (ref 14.0–49.7)
MCH: 28.9 pg (ref 25.1–34.0)
MCHC: 32.7 g/dL (ref 31.5–36.0)
MCV: 88.5 fL (ref 79.5–101.0)
MONO#: 0.5 10*3/uL (ref 0.1–0.9)
MONO%: 5.9 % (ref 0.0–14.0)
NEUT%: 64.8 % (ref 38.4–76.8)
NEUTROS ABS: 5.7 10*3/uL (ref 1.5–6.5)
PLATELETS: 233 10*3/uL (ref 145–400)
RBC: 4.43 10*6/uL (ref 3.70–5.45)
RDW: 14.3 % (ref 11.2–14.5)
WBC: 8.8 10*3/uL (ref 3.9–10.3)

## 2015-05-08 MED ORDER — ANASTROZOLE 1 MG PO TABS: 1.0000 mg | ORAL_TABLET | Freq: Every day | ORAL | Status: DC

## 2015-05-08 NOTE — Progress Notes (Signed)
ID: [name] OB: 1945-10-29  MR#: [id number]  VPX#:[id number]  PCP: Alesia Richards, MD GYN:   SU: Rolm Bookbinder OTHER MD: Kyung Rudd, Meridee Score, [name] Cornella  CHIEF COMPLAINT: Estrogen receptor positive early breast cancer   CURRENT TREATMENT: Anastrozole    BREAST CANCER HISTORY:  From the original intake note:   Danira (also goes by [name]) had bilateral screening mammography at [facility] 02/02/2013. There was a 3 mm nodule in the left breast which was further evaluated November 4, confirming an irregular mass at the 1:00 position of the left breast, not confirmed by ultrasonography. Biopsy of this mass 02/10/2013 showed an invasive ductal carcinoma, grade 1, with a prognostic panel pending. Breast MRI has been scheduled for 02/24/2013  The patient's subsequent history is as detailed below  INTERVAL HISTORY: Tevis returns today for follow up of her estrogen receptor positive breast cancer. She continues on Arimidex, with good tolerance. She obtains it at a good price. Hot flashes are not a problem. We have discussed vaginal dryness issues and she is aware of our intimacy and pelvic health program but at this point is not motivated to participate.   REVIEW OF SYSTEMS: Eleen had some dental problems chiefly infection in the fall requiring antibiotics and antibiotics bottomed out her blood sugar so she has had some difficulty monitoring. That seems to be better and currently she is doing "wonderful". She has some urinary urgency, some low back pain which is not more intense or persistent than before, and she had a mole in her back removed December 2016 with the pathology showing a dysplastic nevus. Margins were cleared with a second shave. Aside from these issues a detailed review of systems was stable  PAST MEDICAL HISTORY: Past Medical History  Diagnosis Date  . Hypertension   . Coronary artery disease     NSTEMI in 05/2009;  LHC with 2 v CAD - > CABG (LIMA-LAD,  SVG-diagonal, SVG-OM1/OM 2).   . Hyperlipidemia   . Ischemic cardiomyopathy     Echo 07/2009: Mild LVH, EF 40-45%, inferior and posterior HK, grade 1 diastolic dysfunction, moderate MAC, mild to moderate MR, mild LAE  . PAD (peripheral artery disease) ([place])   . Family history of anesthesia complication     Mom has a hard time to wake up  . Neuropathy (HCC)     Hx; of B/L feet  . Type II diabetes mellitus ([place])   . Foot ulcer ([place])     "I've had them on both feet" (08/27/2012)  . Osteomyelitis of foot ([place])   . Sinus headache     "occasionally" (08/27/2012)  . CKD (chronic kidney disease), stage I   . 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 Allergy   . NSTEMI (non-ST elevated myocardial infarction) ([place]) 05/23/2009    Archie Endo 05/23/2009 (08/27/2012)  . History of radiation therapy 06/09/13-07/06/13    left breast 50Gy  . Type II or unspecified type diabetes mellitus without mention of complication, not stated as uncontrolled   . Arthritis     "maybe in my lower back" (08/27/2012)  . Gouty arthritis     "right index finger" (08/27/2012)  . Vitamin D deficiency   . Type II diabetes mellitus with neurological manifestations, uncontrolled ([place]) 02/17/2014    PAST SURGICAL HISTORY: Past Surgical History  Procedure Laterality Date  . Cataract extraction w/ intraocular lens  implant, bilateral  2000's  . Finger surgery Right     "index finger;  turned out to be gout" (08/27/2012)  . Coronary artery bypass graft  2011    "CABG X4" (08/27/2012)  . Dental surgery  04/30/11    "1 implant" (08/27/2012)  . Amputation ray Right 08/27/2012    5th ray/notes 08/27/2012  . Cardiac catheterization  05/24/2009    Archie Endo 05/24/2009 (08/27/2012)  . Amputation Right 08/27/2012    Procedure: AMPUTATION RAY;  Surgeon: Newt Minion, MD;  Location: [facility];  Service: Orthopedics;  Laterality: Right;  Right Foot 5th Ray Amputation  . Eye surgery    . Colonoscopy w/  biopsies and polypectomy  2010  . Breast lumpectomy  04/20/2013    with biopsy      DR WAKEFIELD  . Breast lumpectomy with needle localization and axillary sentinel lymph node bx Left 04/20/2013    Procedure: LEFT BREAST WIRE GUIDED LUMPECTOMY AND AXILLARY SENTINEL LYMPH NODE BX;  Surgeon: Rolm Bookbinder, MD;  Location: [facility];  Service: General;  Laterality: Left;  . Dilation and curettage of uterus    . Amputation Left 07/08/2013    Procedure: AMPUTATION RAY;  Surgeon: Newt Minion, MD;  Location: [facility];  Service: Orthopedics;  Laterality: Left;  Left Foot 2nd Ray Amputation    FAMILY HISTORY Family History  Problem Relation Age of Onset  . Kidney cancer Brother 46  . Hypertension Brother   . Breast cancer Sister 57    LCIS; BRCA negative  . Throat cancer Father 11    smoker  . Breast cancer Maternal Aunt     dx in her 47s  . Breast cancer Sister 73    Lobular breast cancer   patient's father died at the age of 104 from throat cancer. The patient's mother died at the age of 23 from "many medical problems". The patient had 4 brothers, 3 sisters. One brother had kidney cancer diagnosed at age 70. One sister was diagnosed with breast cancer at age 34 and died 11/17/12. A second sister was diagnosed with breast cancer at age 74. There is also one of the patient's mother's 5 sisters diagnosed with breast cancer remotely.  GYNECOLOGIC HISTORY:  Menarche age 73 am first live birth age 89, the patient is [place] P94. (She had a twin pregnancy, [name] and [name], delivered prematurely, with the babies dying after a half day). She underwent menopause in her late 90s and used hormone replacement for a few months.  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 Son [name] lives in [place] where he works as a Proofreader. The patient has 3 grandchildren. She attends first [employer] in [place]: In place   HEALTH MAINTENANCE: Social History  Substance Use Topics  . Smoking status: Never Smoker   . Smokeless tobacco: Never Used  . Alcohol Use: No     Comment: rarely drinks wine     Colonoscopy: 2010  PAP: 2010  Bone density: 10/21/2007 at [facility], normal  Lipid panel:  Allergies  Allergen Reactions  . Atenolol Other (See Comments)    Exacerbates gout  . Contrast Media [Iodinated Diagnostic Agents] Rash  . Latex Rash  . Omnipaque [Iohexol] Rash    Current Outpatient Prescriptions  Medication Sig Dispense Refill  . allopurinol (ZYLOPRIM) 100 MG tablet TAKE 1 TABLET TWICE DAILY 180 tablet 1  . anastrozole (ARIMIDEX) 1 MG tablet TAKE 1 TABLET EVERY DAY 90  tablet 3  . aspirin 325 MG tablet Take 325 mg by mouth at bedtime.     Marland Kitchen aspirin-acetaminophen-caffeine (EXCEDRIN MIGRAINE) 250-250-65 MG per tablet Take 1-2 tablets by mouth every 6 (six) hours as needed (sinus headache).    . BD PEN NEEDLE NANO U/F 32G X 4 MM MISC USE TO INJECT TWICE DAILY LANTUS INJECTIONS 180 each 1  . carvedilol (COREG) 12.5 MG tablet TAKE 1 TABLET TWICE DAILY FOR BLOOD PRESSURE 180 tablet 3  . COLCRYS 0.6 MG tablet Take 0.6 mg by mouth daily as needed (for gout flareups).     Marland Kitchen glimepiride (AMARYL) 4 MG tablet Take 1 tablet (4 mg total) by mouth daily with breakfast. 90 tablet 2  . glucose blood test strip Check sugars 3x a day-DX-E10.22 200 each 11  . hydrochlorothiazide (HYDRODIURIL) 12.5 MG tablet TAKE 1 TABLET EVERY DAY 90 tablet 1  . insulin lispro (HUMALOG KWIKPEN) 100 UNIT/ML KiwkPen Inject 0.05-0.07 mLs (5-7 Units total) into the skin 3 (three) times daily. (Patient taking differently: Inject 10 Units into the skin daily. ) 15 mL 3  . LANTUS SOLOSTAR 100 UNIT/ML Solostar Pen INJECT  50 UNITS SUBCUTANEOUSLY EVERY DAY  OR AS DIRECTED (DISCARD AND BEGIN NEW PEN VIAL EVERY 28 DAYS) 45 mL 0  . Melatonin 5 MG TABS Take 5 mg by mouth at bedtime as needed (for sleep).    . metFORMIN  (GLUCOPHAGE-XR) 500 MG 24 hr tablet TAKE 2 TABLETS TWICE DAILY AFTER MEALS FOR DIABETES 360 tablet 3  . Misc. Devices MISC Custom molded inserts for diabetic shoes - ICD10: E11.41. Detailed foot exam performed at the time of the visit on 02/17/2014. Patient has neuropathy, multiple digit amputations and calluses. 2 each 1  . OVER THE COUNTER MEDICATION 2 tablets 1 day or 1 dose. CinSulin (True Nature) Vitamin D, Cinnamon bark and Chromium    . Polyvinyl Alcohol-Povidone (REFRESH OP) Place 1 drop into both eyes daily as needed (dry eyes).    . rosuvastatin (CRESTOR) 20 MG tablet TAKE 1 TABLET  DAILY 90 tablet 1  . valsartan (DIOVAN) 320 MG tablet TAKE 1 TABLET EVERY MORNING FOR BLOOD PRESSURE 90 tablet 1   No current facility-administered medications for this visit.    OBJECTIVE: Middle-aged white woman who appears stated age 70 Vitals:   05/08/15 1429  BP: 180/79  Pulse: 92  Temp: 98.2 F (36.8 C)  Resp: 18     Body mass index is 29.29 kg/(m^2).    ECOG FS:1 - Symptomatic but completely ambulatory  Sclerae unicteric, pupils round and equal Oropharynx clear and moist-- no thrush or other lesions No cervical or supraclavicular adenopathy Lungs no rales or rhonchi Heart regular rate and rhythm Abd soft, obese, nontender, positive bowel sounds MSK no focal spinal tenderness, no upper extremity lymphedema Neuro: nonfocal, well oriented, appropriate affect Breasts: The right breast is unremarkable. The left breast is status post lumpectomy and radiation. There is no evidence of local recurrence. The left axilla is benign.    LAB RESULTS:  CMP     Component Value Date/Time   NA 135 02/14/2015 1600   NA 140 06/22/2014 1222   K 4.5 02/14/2015 1600   K 4.5 06/22/2014 1222   CL 98 02/14/2015 1600   CO2 25 02/14/2015 1600   CO2 28 06/22/2014 1222   GLUCOSE 164* 02/14/2015 1600   GLUCOSE 170* 06/22/2014 1222   BUN 45* 02/14/2015 1600   BUN 33.7* 06/22/2014 1222   CREATININE  1.79* 02/14/2015  1600   CREATININE 1.8* 06/22/2014 1222   CREATININE 1.55* 07/08/2013 0712   CALCIUM 10.1 02/14/2015 1600   CALCIUM 10.2 06/22/2014 1222   PROT 6.9 02/14/2015 1600   PROT 6.7 06/22/2014 1222   ALBUMIN 3.6 02/14/2015 1600   ALBUMIN 3.4* 06/22/2014 1222   AST 16 02/14/2015 1600   AST 16 06/22/2014 1222   ALT 14 02/14/2015 1600   ALT 19 06/22/2014 1222   ALKPHOS 65 02/14/2015 1600   ALKPHOS 87 06/22/2014 1222   BILITOT 0.4 02/14/2015 1600   BILITOT 0.38 06/22/2014 1222   GFRNONAA 29* 02/14/2015 1600   GFRNONAA 34* 07/08/2013 0712   GFRAA 33* 02/14/2015 1600   GFRAA 39* 07/08/2013 0712    I No results found for: SPEP  Lab Results  Component Value Date   WBC 8.8 05/08/2015   NEUTROABS 5.7 05/08/2015   HGB 12.8 05/08/2015   HCT 39.2 05/08/2015   MCV 88.5 05/08/2015   PLT 233 05/08/2015      Chemistry      Component Value Date/Time   NA 135 02/14/2015 1600   NA 140 06/22/2014 1222   K 4.5 02/14/2015 1600   K 4.5 06/22/2014 1222   CL 98 02/14/2015 1600   CO2 25 02/14/2015 1600   CO2 28 06/22/2014 1222   BUN 45* 02/14/2015 1600   BUN 33.7* 06/22/2014 1222   CREATININE 1.79* 02/14/2015 1600   CREATININE 1.8* 06/22/2014 1222   CREATININE 1.55* 07/08/2013 0712      Component Value Date/Time   CALCIUM 10.1 02/14/2015 1600   CALCIUM 10.2 06/22/2014 1222   ALKPHOS 65 02/14/2015 1600   ALKPHOS 87 06/22/2014 1222   AST 16 02/14/2015 1600   AST 16 06/22/2014 1222   ALT 14 02/14/2015 1600   ALT 19 06/22/2014 1222   BILITOT 0.4 02/14/2015 1600   BILITOT 0.38 06/22/2014 1222       No results found for: LABCA2  No components found for: LABCA125  No results for input(s): INR in the last 168 hours.  Urinalysis    Component Value Date/Time   COLORURINE YELLOW 02/14/2015 1600   APPEARANCEUR CLEAR 02/14/2015 1600   LABSPEC 1.008 02/14/2015 1600   PHURINE 6.0 02/14/2015 1600   GLUCOSEU TRACE* 02/14/2015 1600   HGBUR 1+* 02/14/2015 1600    BILIRUBINUR NEGATIVE 02/14/2015 1600   KETONESUR NEGATIVE 02/14/2015 1600   PROTEINUR 2+* 02/14/2015 1600   UROBILINOGEN 0.2 05/30/2009 1500   NITRITE NEGATIVE 02/14/2015 1600   LEUKOCYTESUR TRACE* 02/14/2015 1600    STUDIES: Unilateral right mammography with [facility] 12/20/2014 did see a stable oval density in the right breast middle depth but no other masses or calcifications. Ultrasound was obtained the same day.  ASSESSMENT: 70 y.o. BRCA negative [place] woman status post left breast biopsy 02/10/2013 for a clinical T1a N0, stage IA invasive ductal carcinoma, grade 1, with a prognostic panel pending  (1) status post left lumpectomy and sentinel lymph node sampling 04/20/2013 for a pT1a pN0, stage IA invasive ductal carcinoma, grade 1, with negative margins  (2) completed adjuvant radiation 07/06/2013  (3) started anastrozole 09/05/2013; DEXA scan 07/28/2013 was normal with a T score of -0.3  PLAN: Roselind side effects. The plan is for a total of 5 years on this drug. She had a normal bone density April 2015. She is on calcium and vitamin D supplementation. I have encouraged her to walk as much as she is able although that is difficult for her because of her multiple  comorbid problems.  She is trying to thin out her doctors and has pretty much gotten rid of her endocrinologist and cardiologist. She is can it see [name] in September and from that point we will start seeing her on a once a year basis.  She knows to call for any problems that may develop before next visit here.   Marland KitchenChauncey Cruel, MD   05/08/2015 2:38 PM

## 2015-05-08 NOTE — Telephone Encounter (Signed)
Appointments made and avs printed for patient °

## 2015-05-24 ENCOUNTER — Ambulatory Visit (INDEPENDENT_AMBULATORY_CARE_PROVIDER_SITE_OTHER): Payer: Medicare Other | Admitting: Internal Medicine

## 2015-05-24 ENCOUNTER — Encounter: Payer: Self-pay | Admitting: Internal Medicine

## 2015-05-24 VITALS — BP 150/86 | HR 80 | Temp 98.0°F | Resp 18 | Ht 65.0 in | Wt 176.0 lb

## 2015-05-24 DIAGNOSIS — I10 Essential (primary) hypertension: Secondary | ICD-10-CM | POA: Diagnosis not present

## 2015-05-24 DIAGNOSIS — N183 Chronic kidney disease, stage 3 unspecified: Secondary | ICD-10-CM

## 2015-05-24 DIAGNOSIS — Z794 Long term (current) use of insulin: Secondary | ICD-10-CM

## 2015-05-24 DIAGNOSIS — E559 Vitamin D deficiency, unspecified: Secondary | ICD-10-CM

## 2015-05-24 DIAGNOSIS — Z79899 Other long term (current) drug therapy: Secondary | ICD-10-CM

## 2015-05-24 DIAGNOSIS — E119 Type 2 diabetes mellitus without complications: Secondary | ICD-10-CM

## 2015-05-24 DIAGNOSIS — E782 Mixed hyperlipidemia: Secondary | ICD-10-CM

## 2015-05-24 DIAGNOSIS — E1122 Type 2 diabetes mellitus with diabetic chronic kidney disease: Secondary | ICD-10-CM

## 2015-05-24 LAB — CBC WITH DIFFERENTIAL/PLATELET
BASOS PCT: 0 % (ref 0–1)
Basophils Absolute: 0 10*3/uL (ref 0.0–0.1)
EOS ABS: 0.2 10*3/uL (ref 0.0–0.7)
EOS PCT: 2 % (ref 0–5)
HCT: 38.6 % (ref 36.0–46.0)
Hemoglobin: 12.5 g/dL (ref 12.0–15.0)
LYMPHS ABS: 2.1 10*3/uL (ref 0.7–4.0)
Lymphocytes Relative: 24 % (ref 12–46)
MCH: 28.7 pg (ref 26.0–34.0)
MCHC: 32.4 g/dL (ref 30.0–36.0)
MCV: 88.5 fL (ref 78.0–100.0)
MONO ABS: 0.8 10*3/uL (ref 0.1–1.0)
MONOS PCT: 9 % (ref 3–12)
MPV: 10.2 fL (ref 8.6–12.4)
Neutro Abs: 5.7 10*3/uL (ref 1.7–7.7)
Neutrophils Relative %: 65 % (ref 43–77)
PLATELETS: 270 10*3/uL (ref 150–400)
RBC: 4.36 MIL/uL (ref 3.87–5.11)
RDW: 14.8 % (ref 11.5–15.5)
WBC: 8.7 10*3/uL (ref 4.0–10.5)

## 2015-05-24 LAB — BASIC METABOLIC PANEL WITH GFR
BUN: 48 mg/dL — AB (ref 7–25)
CALCIUM: 10.4 mg/dL (ref 8.6–10.4)
CHLORIDE: 103 mmol/L (ref 98–110)
CO2: 27 mmol/L (ref 20–31)
CREATININE: 2.19 mg/dL — AB (ref 0.50–0.99)
GFR, EST AFRICAN AMERICAN: 26 mL/min — AB (ref >=60)
GFR, Est Non African American: 22 mL/min — ABNORMAL LOW (ref >=60)
Glucose, Bld: 139 mg/dL — ABNORMAL HIGH (ref 65–99)
Potassium: 5.1 mmol/L (ref 3.5–5.3)
SODIUM: 140 mmol/L (ref 135–146)

## 2015-05-24 LAB — HEMOGLOBIN A1C
HEMOGLOBIN A1C: 9.4 % — AB (ref <5.7)
Mean Plasma Glucose: 223 mg/dL — ABNORMAL HIGH (ref <117)

## 2015-05-24 LAB — TSH: TSH: 1.71 m[IU]/L

## 2015-05-24 LAB — HEPATIC FUNCTION PANEL
ALK PHOS: 73 U/L (ref 33–130)
ALT: 18 U/L (ref 6–29)
AST: 18 U/L (ref 10–35)
Albumin: 3.9 g/dL (ref 3.6–5.1)
BILIRUBIN INDIRECT: 0.2 mg/dL (ref 0.2–1.2)
Bilirubin, Direct: 0.1 mg/dL (ref <=0.2)
TOTAL PROTEIN: 6.7 g/dL (ref 6.1–8.1)
Total Bilirubin: 0.3 mg/dL (ref 0.2–1.2)

## 2015-05-24 LAB — LIPID PANEL
Cholesterol: 118 mg/dL — ABNORMAL LOW (ref 125–200)
HDL: 26 mg/dL — AB (ref >=46)
LDL CALC: 31 mg/dL (ref <130)
Total CHOL/HDL Ratio: 4.5 Ratio (ref <=5.0)
Triglycerides: 306 mg/dL — ABNORMAL HIGH (ref <150)
VLDL: 61 mg/dL — AB (ref <30)

## 2015-05-24 NOTE — Progress Notes (Signed)
Patient ID: [name], female   DOB: 03/22/1946, 70 y.o.   MRN: [id number]  Assessment and Plan:  Hypertension:  -Continue medication -monitor blood pressure at home. -Continue DASH diet -Reminder to go to the ER if any CP, SOB, nausea, dizziness, severe HA, changes vision/speech, left arm numbness and tingling and jaw pain.  Cholesterol - Continue diet and exercise -Check cholesterol.   Diabetes with diabetic chronic kidney disease  -likely needs an increase in lantus or split the dose to morning and night.  -Continue diet and exercise.  -Check A1C  Vitamin D Def -check level -continue medications.   Continue diet and meds as discussed. Further disposition pending results of labs. Discussed med's effects and SE's.    HPI 70 y.o. female  presents for 3 month follow up with hypertension, hyperlipidemia, diabetes and vitamin D deficiency.   Her blood pressure has been controlled at home, today their BP is BP: (!) 150/86 mmHg.She does not workout. She denies chest pain, shortness of breath, dizziness.   She is on cholesterol medication and denies myalgias. Her cholesterol is at goal. The cholesterol was:  02/14/2015: Cholesterol 122*; HDL 24*; LDL Cholesterol 27; Triglycerides 353*   She has been working on diet and exercise for diabetes with diabetic chronic kidney disease, she is on bASA, she is on ACE/ARB, and denies  foot ulcerations, hyperglycemia, hypoglycemia , increased appetite, nausea, paresthesia of the feet, polydipsia, polyuria, visual disturbances, vomiting and weight loss. Last A1C was: 02/14/2015: Hgb A1c MFr Bld 11.6*.  She reports that she has been having some lower blood sugar readings at home during the night when she was on antibiotics for dental infection.  She reports that the morning blood sugars are 128-180.  She is doing lantus 25 units in the evenings.     Patient is on Vitamin D supplement. 02/14/2015: Vit D, 25-Hydroxy 32  She reports that she does feel  like she is having some cloudy urine lately.  She does report she is afraid of having a urinary tract infection.    Current Medications:  Current Outpatient Prescriptions on File Prior to Visit  Medication Sig Dispense Refill  . allopurinol (ZYLOPRIM) 100 MG tablet TAKE 1 TABLET TWICE DAILY 180 tablet 1  . anastrozole (ARIMIDEX) 1 MG tablet Take 1 tablet (1 mg total) by mouth daily. 90 tablet 3  . aspirin 325 MG tablet Take 325 mg by mouth at bedtime.     Marland Kitchen aspirin-acetaminophen-caffeine (EXCEDRIN MIGRAINE) 250-250-65 MG per tablet Take 1-2 tablets by mouth every 6 (six) hours as needed (sinus headache).    . BD PEN NEEDLE NANO U/F 32G X 4 MM MISC USE TO INJECT TWICE DAILY LANTUS INJECTIONS 180 each 1  . carvedilol (COREG) 12.5 MG tablet TAKE 1 TABLET TWICE DAILY FOR BLOOD PRESSURE 180 tablet 3  . COLCRYS 0.6 MG tablet Take 0.6 mg by mouth daily as needed (for gout flareups).     Marland Kitchen glimepiride (AMARYL) 4 MG tablet Take 1 tablet (4 mg total) by mouth daily with breakfast. 90 tablet 2  . glucose blood test strip Check sugars 3x a day-DX-E10.22 200 each 11  . hydrochlorothiazide (HYDRODIURIL) 12.5 MG tablet TAKE 1 TABLET EVERY DAY 90 tablet 1  . insulin lispro (HUMALOG KWIKPEN) 100 UNIT/ML KiwkPen Inject 0.05-0.07 mLs (5-7 Units total) into the skin 3 (three) times daily. (Patient taking differently: Inject 10 Units into the skin daily. ) 15 mL 3  . LANTUS SOLOSTAR 100 UNIT/ML Solostar Pen INJECT  50  UNITS SUBCUTANEOUSLY EVERY DAY  OR AS DIRECTED (DISCARD AND BEGIN NEW PEN VIAL EVERY 28 DAYS) 45 mL 0  . Melatonin 5 MG TABS Take 5 mg by mouth at bedtime as needed (for sleep).    . metFORMIN (GLUCOPHAGE-XR) 500 MG 24 hr tablet TAKE 2 TABLETS TWICE DAILY AFTER MEALS FOR DIABETES 360 tablet 3  . Misc. Devices MISC Custom molded inserts for diabetic shoes - ICD10: E11.41. Detailed foot exam performed at the time of the visit on 02/17/2014. Patient has neuropathy, multiple digit amputations and calluses. 2  each 1  . OVER THE COUNTER MEDICATION 2 tablets 1 day or 1 dose. CinSulin (True Nature) Vitamin D, Cinnamon bark and Chromium    . Polyvinyl Alcohol-Povidone (REFRESH OP) Place 1 drop into both eyes daily as needed (dry eyes).    . rosuvastatin (CRESTOR) 20 MG tablet TAKE 1 TABLET  DAILY 90 tablet 1  . valsartan (DIOVAN) 320 MG tablet TAKE 1 TABLET EVERY MORNING FOR BLOOD PRESSURE 90 tablet 1   No current facility-administered medications on file prior to visit.   Medical History:  Past Medical History  Diagnosis Date  . Hypertension   . Coronary artery disease     NSTEMI in 05/2009;  LHC with 2 v CAD - > CABG (LIMA-LAD, SVG-diagonal, SVG-OM1/OM 2).   . Hyperlipidemia   . Ischemic cardiomyopathy     Echo 07/2009: Mild LVH, EF 40-45%, inferior and posterior HK, grade 1 diastolic dysfunction, moderate MAC, mild to moderate MR, mild LAE  . PAD (peripheral artery disease) ([place])   . Family history of anesthesia complication     Mom has a hard time to wake up  . Neuropathy (HCC)     Hx; of B/L feet  . Type II diabetes mellitus ([place])   . Foot ulcer ([place])     "I've had them on both feet" (08/27/2012)  . Osteomyelitis of foot ([place])   . Sinus headache     "occasionally" (08/27/2012)  . CKD (chronic kidney disease), stage I   . Breast cancer ([place]) 02/10/13    left breast bx=Invasive ductal Ca,DCIS w/calcifications  . Allergy   . NSTEMI (non-ST elevated myocardial infarction) ([place]) 05/23/2009    Archie Endo 05/23/2009 (08/27/2012)  . History of radiation therapy 06/09/13-07/06/13    left breast 50Gy  . Type II or unspecified type diabetes mellitus without mention of complication, not stated as uncontrolled   . Arthritis     "maybe in my lower back" (08/27/2012)  . Gouty arthritis     "right index finger" (08/27/2012)  . Vitamin D deficiency   . Type II diabetes mellitus with neurological manifestations, uncontrolled ([place]) 02/17/2014   Allergies:  Allergies  Allergen Reactions  . Atenolol Other (See  Comments)    Exacerbates gout  . Contrast Media [Iodinated Diagnostic Agents] Rash  . Latex Rash  . Omnipaque [Iohexol] Rash     Review of Systems:  Review of Systems  Constitutional: Negative for fever, chills and malaise/fatigue.  HENT: Negative for congestion, ear pain and sore throat.   Eyes: Negative.   Respiratory: Negative for cough, shortness of breath and wheezing.   Cardiovascular: Negative for chest pain, palpitations and leg swelling.  Gastrointestinal: Negative for heartburn, abdominal pain, diarrhea, constipation, blood in stool and melena.  Genitourinary: Negative.   Skin: Negative.   Neurological: Negative for dizziness, sensory change, loss of consciousness and headaches.  Psychiatric/Behavioral: Negative for depression. The patient is not nervous/anxious and does not have insomnia.  Family history- Review and unchanged  Social history- Review and unchanged  Physical Exam: BP 150/86 mmHg  Pulse 80  Temp(Src) 98 F (36.7 C) (Temporal)  Resp 18  Ht 5\' 5"  (1.651 m)  Wt 176 lb (79.833 kg)  BMI 29.29 kg/m2 Wt Readings from Last 3 Encounters:  05/24/15 176 lb (79.833 kg)  05/08/15 176 lb (79.833 kg)  04/18/15 176 lb 1.6 oz (79.878 kg)   General Appearance: Well nourished well developed, non-toxic appearing, in no apparent distress. Eyes: PERRLA, EOMs, conjunctiva no swelling or erythema ENT/Mouth: Ear canals clear with no erythema, swelling, or discharge.  TMs normal bilaterally, oropharynx clear, moist, with no exudate.   Neck: Supple, thyroid normal, no JVD, no cervical adenopathy.  Respiratory: Respiratory effort normal, breath sounds clear A&P, no wheeze, rhonchi or rales noted.  No retractions, no accessory muscle usage Cardio: RRR with no MRGs. No noted edema.  Abdomen: Soft, + BS.  Non tender, no guarding, rebound, hernias, masses. Musculoskeletal: Full ROM, 5/5 strength, Normal gait Skin: Warm, dry without rashes, lesions, ecchymosis.  Neuro:  Awake and oriented X 3, Cranial nerves intact. No cerebellar symptoms.  Psych: normal affect, Insight and Judgment appropriate.    Starlyn Skeans, PA-C 11:10 AM [facility]

## 2015-06-08 ENCOUNTER — Other Ambulatory Visit: Payer: Self-pay | Admitting: Internal Medicine

## 2015-06-18 ENCOUNTER — Encounter: Payer: Self-pay | Admitting: *Deleted

## 2015-07-12 DIAGNOSIS — H02834 Dermatochalasis of left upper eyelid: Secondary | ICD-10-CM | POA: Diagnosis not present

## 2015-07-12 DIAGNOSIS — H5340 Unspecified visual field defects: Secondary | ICD-10-CM | POA: Diagnosis not present

## 2015-07-12 DIAGNOSIS — L908 Other atrophic disorders of skin: Secondary | ICD-10-CM | POA: Diagnosis not present

## 2015-07-12 DIAGNOSIS — H02831 Dermatochalasis of right upper eyelid: Secondary | ICD-10-CM | POA: Diagnosis not present

## 2015-08-10 ENCOUNTER — Other Ambulatory Visit: Payer: Self-pay | Admitting: Internal Medicine

## 2015-08-28 ENCOUNTER — Ambulatory Visit: Payer: Self-pay | Admitting: Internal Medicine

## 2015-10-15 ENCOUNTER — Other Ambulatory Visit: Payer: Self-pay | Admitting: Internal Medicine

## 2015-11-21 ENCOUNTER — Other Ambulatory Visit: Payer: Self-pay | Admitting: Internal Medicine

## 2015-11-28 ENCOUNTER — Other Ambulatory Visit: Payer: Self-pay | Admitting: Internal Medicine

## 2015-12-11 ENCOUNTER — Other Ambulatory Visit: Payer: Self-pay | Admitting: Internal Medicine

## 2015-12-17 ENCOUNTER — Encounter: Payer: Self-pay | Admitting: Internal Medicine

## 2015-12-17 ENCOUNTER — Ambulatory Visit (INDEPENDENT_AMBULATORY_CARE_PROVIDER_SITE_OTHER): Payer: Medicare Other | Admitting: Internal Medicine

## 2015-12-17 VITALS — BP 152/80 | HR 80 | Temp 98.0°F | Resp 18 | Ht 65.0 in | Wt 174.0 lb

## 2015-12-17 DIAGNOSIS — Z79899 Other long term (current) drug therapy: Secondary | ICD-10-CM | POA: Diagnosis not present

## 2015-12-17 DIAGNOSIS — I509 Heart failure, unspecified: Secondary | ICD-10-CM

## 2015-12-17 DIAGNOSIS — N183 Chronic kidney disease, stage 3 unspecified: Secondary | ICD-10-CM

## 2015-12-17 DIAGNOSIS — E782 Mixed hyperlipidemia: Secondary | ICD-10-CM

## 2015-12-17 DIAGNOSIS — E1165 Type 2 diabetes mellitus with hyperglycemia: Secondary | ICD-10-CM

## 2015-12-17 DIAGNOSIS — IMO0002 Reserved for concepts with insufficient information to code with codable children: Secondary | ICD-10-CM

## 2015-12-17 DIAGNOSIS — Z23 Encounter for immunization: Secondary | ICD-10-CM | POA: Diagnosis not present

## 2015-12-17 DIAGNOSIS — E559 Vitamin D deficiency, unspecified: Secondary | ICD-10-CM | POA: Diagnosis not present

## 2015-12-17 DIAGNOSIS — E119 Type 2 diabetes mellitus without complications: Secondary | ICD-10-CM

## 2015-12-17 DIAGNOSIS — I739 Peripheral vascular disease, unspecified: Secondary | ICD-10-CM | POA: Diagnosis not present

## 2015-12-17 DIAGNOSIS — I10 Essential (primary) hypertension: Secondary | ICD-10-CM

## 2015-12-17 DIAGNOSIS — Z794 Long term (current) use of insulin: Secondary | ICD-10-CM | POA: Diagnosis not present

## 2015-12-17 DIAGNOSIS — E1140 Type 2 diabetes mellitus with diabetic neuropathy, unspecified: Secondary | ICD-10-CM | POA: Diagnosis not present

## 2015-12-17 LAB — CBC WITH DIFFERENTIAL/PLATELET
BASOS PCT: 0 %
Basophils Absolute: 0 cells/uL (ref 0–200)
EOS ABS: 249 {cells}/uL (ref 15–500)
Eosinophils Relative: 3 %
HCT: 38.6 % (ref 35.0–45.0)
Hemoglobin: 12.8 g/dL (ref 11.7–15.5)
LYMPHS PCT: 26 %
Lymphs Abs: 2158 cells/uL (ref 850–3900)
MCH: 29.1 pg (ref 27.0–33.0)
MCHC: 33.2 g/dL (ref 32.0–36.0)
MCV: 87.7 fL (ref 80.0–100.0)
MONOS PCT: 8 %
MPV: 10.9 fL (ref 7.5–12.5)
Monocytes Absolute: 664 cells/uL (ref 200–950)
Neutro Abs: 5229 cells/uL (ref 1500–7800)
Neutrophils Relative %: 63 %
PLATELETS: 236 10*3/uL (ref 140–400)
RBC: 4.4 MIL/uL (ref 3.80–5.10)
RDW: 15.1 % — AB (ref 11.0–15.0)
WBC: 8.3 10*3/uL (ref 3.8–10.8)

## 2015-12-17 LAB — HEPATIC FUNCTION PANEL
ALK PHOS: 72 U/L (ref 33–130)
ALT: 23 U/L (ref 6–29)
AST: 19 U/L (ref 10–35)
Albumin: 4.2 g/dL (ref 3.6–5.1)
BILIRUBIN TOTAL: 0.4 mg/dL (ref 0.2–1.2)
Bilirubin, Direct: 0.1 mg/dL (ref <=0.2)
Indirect Bilirubin: 0.3 mg/dL (ref 0.2–1.2)
Total Protein: 6.9 g/dL (ref 6.1–8.1)

## 2015-12-17 LAB — LIPID PANEL
Cholesterol: 119 mg/dL — ABNORMAL LOW (ref 125–200)
HDL: 27 mg/dL — ABNORMAL LOW (ref >=46)
LDL CALC: 21 mg/dL (ref <130)
Total CHOL/HDL Ratio: 4.4 Ratio (ref <=5.0)
Triglycerides: 353 mg/dL — ABNORMAL HIGH (ref <150)
VLDL: 71 mg/dL — ABNORMAL HIGH (ref <30)

## 2015-12-17 LAB — HEMOGLOBIN A1C
Hgb A1c MFr Bld: 9.9 % — ABNORMAL HIGH (ref <5.7)
Mean Plasma Glucose: 237 mg/dL

## 2015-12-17 LAB — BASIC METABOLIC PANEL WITH GFR
BUN: 53 mg/dL — AB (ref 7–25)
CO2: 25 mmol/L (ref 20–31)
CREATININE: 2.47 mg/dL — AB (ref 0.50–0.99)
Calcium: 10.6 mg/dL — ABNORMAL HIGH (ref 8.6–10.4)
Chloride: 105 mmol/L (ref 98–110)
GFR, EST AFRICAN AMERICAN: 22 mL/min — AB (ref >=60)
GFR, Est Non African American: 19 mL/min — ABNORMAL LOW (ref >=60)
Glucose, Bld: 128 mg/dL — ABNORMAL HIGH (ref 65–99)
POTASSIUM: 5.9 mmol/L — AB (ref 3.5–5.3)
Sodium: 140 mmol/L (ref 135–146)

## 2015-12-17 NOTE — Progress Notes (Signed)
Assessment and Plan:  Hypertension:  -consider addition of CCB as BP not well controlled -Continue medication -monitor blood pressure at home. -Continue DASH diet -Reminder to go to the ER if any CP, SOB, nausea, dizziness, severe HA, changes vision/speech, left arm numbness and tingling and jaw pain.  Cholesterol -cont statin - Continue diet and exercise -Check cholesterol.   Diabetes with diabetic chronic kidney disease  -cont meds -consider switching to 70/30 insulin -discussed diet today and recommended 2 day diet log and also blood sugar log to bring to visit with Dr. Melford Aase -Continue diet and exercise.  -Check A1C  Vitamin D Def -check level -continue medications.   Coronary artery sclerosis and PVD -cont statins -cont blood pressure and blood sugar control  Breast Cancer -currently in remission -still followed by Dr. Jana Hakim     Continue diet and meds as discussed. Further disposition pending results of labs. Discussed med's effects and SE's.    HPI 70 y.o. female  presents for 3 month follow up with hypertension, hyperlipidemia, diabetes and vitamin D deficiency.   Her blood pressure has not been controlled at home, today their BP is BP: (!) 152/80.She does not workout. She denies chest pain, shortness of breath, dizziness.   She is on cholesterol medication and denies myalgias. Her cholesterol is at goal. The cholesterol was:  05/24/2015: Cholesterol 118; HDL 26; LDL Cholesterol 31; Triglycerides 306   She has been working on diet and exercise for diabetes with diabetic chronic kidney disease, she is not on bASA, she is on ACE/ARB, and denies  foot ulcerations, hyperglycemia, hypoglycemia , increased appetite, nausea, paresthesia of the feet, polydipsia, polyuria, visual disturbances, vomiting and weight loss. Last A1C was: 05/24/2015: Hgb A1c MFr Bld 9.4.  She reports that she is not currently doing the humalog.  She is also taking only 1 of the glipizide.  She  reports that her blood sugars are ranging between 130-180.  She feels like she has problems with chronic infections. She has been eating foods like toast with avocado and cream cheese in the morning and then orzo and shrimp at nighttime.    Patient is on Vitamin D supplement. 02/14/2015: Vit D, 25-Hydroxy 32  She reports that she has been having issues with her feet.  She has been having some sores on her bilateral feet.  She has to cut holes in her shoes.  She reports that she cannot find good shoes.  She reports that she has seen Dr. Sharol Given and also the podiatrist.    She reports that she is due to see her cardiologist.  She hasn't been since 2015.    She reports that she is also no longer going to Dr. Renne Crigler.  She hasn't been there since 2015.    Current Medications:  Current Outpatient Prescriptions on File Prior to Visit  Medication Sig Dispense Refill  . allopurinol (ZYLOPRIM) 100 MG tablet TAKE 1 TABLET TWICE DAILY 90 tablet 0  . anastrozole (ARIMIDEX) 1 MG tablet Take 1 tablet (1 mg total) by mouth daily. 90 tablet 3  . aspirin 325 MG tablet Take 325 mg by mouth at bedtime.     . BD PEN NEEDLE NANO U/F 32G X 4 MM MISC USE TO INJECT TWICE DAILY LANTUS INJECTIONS 180 each 1  . carvedilol (COREG) 12.5 MG tablet TAKE 1 TABLET TWICE DAILY FOR BLOOD PRESSURE 180 tablet 3  . glimepiride (AMARYL) 4 MG tablet TAKE 1 TABLET EVERY DAY WITH BREAKFAST 90 tablet 0  .  glucose blood test strip Check sugars 3x a day-DX-E10.22 200 each 11  . hydrochlorothiazide (HYDRODIURIL) 12.5 MG tablet TAKE 1 TABLET EVERY DAY 90 tablet 1  . LANTUS SOLOSTAR 100 UNIT/ML Solostar Pen INJECT  50 UNITS SUBCUTANEOUSLY EVERY DAY  OR AS DIRECTED (DISCARD AND BEGIN NEW PEN EVERY 28 DAYS) 45 mL 0  . metFORMIN (GLUCOPHAGE-XR) 500 MG 24 hr tablet TAKE 2 TABLETS TWICE DAILY AFTER MEALS FOR DIABETES 360 tablet 3  . Misc. Devices MISC Custom molded inserts for diabetic shoes - ICD10: E11.41. Detailed foot exam performed at the time  of the visit on 02/17/2014. Patient has neuropathy, multiple digit amputations and calluses. 2 each 1  . Polyvinyl Alcohol-Povidone (REFRESH OP) Place 1 drop into both eyes daily as needed (dry eyes).    . rosuvastatin (CRESTOR) 20 MG tablet TAKE 1 TABLET  DAILY 90 tablet 1  . valsartan (DIOVAN) 320 MG tablet TAKE 1 TABLET EVERY MORNING FOR BLOOD PRESSURE 90 tablet 1   No current facility-administered medications on file prior to visit.    Medical History:  Past Medical History:  Diagnosis Date  . Allergy   . Arthritis    "maybe in my lower back" (08/27/2012)  . 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 CKD (chronic kidney disease), stage I   . Coronary artery disease    NSTEMI in 05/2009;  LHC with 2 v CAD - > CABG (LIMA-LAD, SVG-diagonal, SVG-OM1/OM 2).   . Family history of anesthesia complication    Mom has a hard time to wake up  . Foot ulcer ([place])    "I've had them on both feet" (08/27/2012)  . Gouty arthritis    "right index finger" (08/27/2012)  . History of radiation therapy 06/09/13-07/06/13   left breast 50Gy  . Hyperlipidemia   . Hypertension   . Ischemic cardiomyopathy    Echo 07/2009: Mild LVH, EF 40-45%, inferior and posterior HK, grade 1 diastolic dysfunction, moderate MAC, mild to moderate MR, mild LAE  . Neuropathy (HCC)    Hx; of B/L feet  . NSTEMI (non-ST elevated myocardial infarction) ([place]) 05/23/2009   Archie Endo 05/23/2009 (08/27/2012)  . Osteomyelitis of foot ([place])   . PAD (peripheral artery disease) ([place])   . Sinus headache    "occasionally" (08/27/2012)  . Type II diabetes mellitus ([place])   . Type II diabetes mellitus with neurological manifestations, uncontrolled ([place]) 02/17/2014  . Type II or unspecified type diabetes mellitus without mention of complication, not stated as uncontrolled   . Vitamin D deficiency    Allergies:  Allergies  Allergen Reactions  . Atenolol Other (See Comments)    Exacerbates gout  .  Contrast Media [Iodinated Diagnostic Agents] Rash  . Latex Rash  . Omnipaque [Iohexol] Rash     Review of Systems:  Review of Systems  Constitutional: Negative for chills, fever and malaise/fatigue.  HENT: Negative for congestion, ear pain and sore throat.   Eyes: Negative.   Respiratory: Negative for cough, shortness of breath and wheezing.   Cardiovascular: Negative for chest pain, palpitations and leg swelling.  Gastrointestinal: Negative for abdominal pain, blood in stool, constipation, diarrhea, heartburn and melena.  Genitourinary: Negative.   Skin: Negative.   Neurological: Negative for dizziness, sensory change, loss of consciousness and headaches.  Psychiatric/Behavioral: Negative for depression. The patient is not nervous/anxious and does not have insomnia.     Family history- Review and unchanged  Social history- Review and unchanged  Physical Exam:  BP (!) 152/80   Pulse 80   Temp 98 F (36.7 C) (Temporal)   Resp 18   Ht 5\' 5"  (1.651 m)   Wt 174 lb (78.9 kg)   BMI 28.96 kg/m  Wt Readings from Last 3 Encounters:  12/17/15 174 lb (78.9 kg)  05/24/15 176 lb (79.8 kg)  05/08/15 176 lb (79.8 kg)   General Appearance: Well nourished well developed, non-toxic appearing, in no apparent distress. Eyes: PERRLA, EOMs, conjunctiva no swelling or erythema ENT/Mouth: Ear canals clear with no erythema, swelling, or discharge.  TMs normal bilaterally, oropharynx clear, moist, with no exudate.   Neck: Supple, thyroid normal, no JVD, no cervical adenopathy.  Respiratory: Respiratory effort normal, breath sounds clear A&P, no wheeze, rhonchi or rales noted.  No retractions, no accessory muscle usage Cardio: RRR with no MRGs. No noted edema.  Abdomen: Soft, + BS.  Non tender, no guarding, rebound, hernias, masses. Musculoskeletal: Full ROM, 5/5 strength, Normal gait Skin: Warm, dry without rashes, lesions, ecchymosis.  Neuro: Awake and oriented X 3, Cranial nerves intact. No  cerebellar symptoms.  Psych: normal affect, Insight and Judgment appropriate.    Starlyn Skeans, PA-C 11:13 AM [facility]

## 2015-12-18 ENCOUNTER — Other Ambulatory Visit: Payer: Self-pay | Admitting: Internal Medicine

## 2015-12-18 DIAGNOSIS — N184 Chronic kidney disease, stage 4 (severe): Secondary | ICD-10-CM

## 2015-12-18 LAB — TSH: TSH: 2.54 m[IU]/L

## 2015-12-25 DIAGNOSIS — Z853 Personal history of malignant neoplasm of breast: Secondary | ICD-10-CM | POA: Diagnosis not present

## 2015-12-26 ENCOUNTER — Other Ambulatory Visit: Payer: Self-pay | Admitting: *Deleted

## 2015-12-26 DIAGNOSIS — C50212 Malignant neoplasm of upper-inner quadrant of left female breast: Secondary | ICD-10-CM

## 2015-12-27 ENCOUNTER — Ambulatory Visit (HOSPITAL_BASED_OUTPATIENT_CLINIC_OR_DEPARTMENT_OTHER): Payer: Medicare Other | Admitting: Oncology

## 2015-12-27 ENCOUNTER — Other Ambulatory Visit (HOSPITAL_BASED_OUTPATIENT_CLINIC_OR_DEPARTMENT_OTHER): Payer: Medicare Other

## 2015-12-27 VITALS — BP 158/64 | HR 81 | Temp 99.4°F | Resp 18 | Ht 65.0 in | Wt 173.1 lb

## 2015-12-27 DIAGNOSIS — C50212 Malignant neoplasm of upper-inner quadrant of left female breast: Secondary | ICD-10-CM | POA: Diagnosis not present

## 2015-12-27 DIAGNOSIS — Z79811 Long term (current) use of aromatase inhibitors: Secondary | ICD-10-CM

## 2015-12-27 LAB — CBC WITH DIFFERENTIAL/PLATELET
BASO%: 0.1 % (ref 0.0–2.0)
BASOS ABS: 0 10*3/uL (ref 0.0–0.1)
EOS ABS: 0.3 10*3/uL (ref 0.0–0.5)
EOS%: 3.3 % (ref 0.0–7.0)
HEMATOCRIT: 37.1 % (ref 34.8–46.6)
HGB: 12.1 g/dL (ref 11.6–15.9)
LYMPH%: 25.6 % (ref 14.0–49.7)
MCH: 29.2 pg (ref 25.1–34.0)
MCHC: 32.6 g/dL (ref 31.5–36.0)
MCV: 89.4 fL (ref 79.5–101.0)
MONO#: 0.6 10*3/uL (ref 0.1–0.9)
MONO%: 7.9 % (ref 0.0–14.0)
NEUT#: 4.9 10*3/uL (ref 1.5–6.5)
NEUT%: 63.1 % (ref 38.4–76.8)
PLATELETS: 226 10*3/uL (ref 145–400)
RBC: 4.15 10*6/uL (ref 3.70–5.45)
RDW: 14.9 % — AB (ref 11.2–14.5)
WBC: 7.7 10*3/uL (ref 3.9–10.3)
lymph#: 2 10*3/uL (ref 0.9–3.3)

## 2015-12-27 LAB — COMPREHENSIVE METABOLIC PANEL
ALT: 22 U/L (ref 0–55)
ANION GAP: 10 meq/L (ref 3–11)
AST: 19 U/L (ref 5–34)
Albumin: 3.5 g/dL (ref 3.5–5.0)
Alkaline Phosphatase: 78 U/L (ref 40–150)
BUN: 49.5 mg/dL — ABNORMAL HIGH (ref 7.0–26.0)
CALCIUM: 10.1 mg/dL (ref 8.4–10.4)
CHLORIDE: 103 meq/L (ref 98–109)
CO2: 24 mEq/L (ref 22–29)
CREATININE: 2.7 mg/dL — AB (ref 0.6–1.1)
EGFR: 17 mL/min/{1.73_m2} — AB (ref >90)
Glucose: 243 mg/dl — ABNORMAL HIGH (ref 70–140)
POTASSIUM: 5.2 meq/L — AB (ref 3.5–5.1)
Sodium: 137 mEq/L (ref 136–145)
Total Bilirubin: 0.31 mg/dL (ref 0.20–1.20)
Total Protein: 7 g/dL (ref 6.4–8.3)

## 2015-12-27 NOTE — Progress Notes (Signed)
ID: [name] OB: May 29, 1945  MR#: [id number]  ZRA#:[id number]  PCP: Alesia Richards, MD GYN:   SU: Rolm Bookbinder OTHER MD: Kyung Rudd, Meridee Score, [place]  CHIEF COMPLAINT: Estrogen receptor positive early breast cancer   CURRENT TREATMENT: Anastrozole    BREAST CANCER HISTORY:  From the original intake note:   [name] (also goes by [name]) had bilateral screening mammography at [facility] 02/02/2013. There was a 3 mm nodule in the left breast which was further evaluated November 4, confirming an irregular mass at the 1:00 position of the left breast, not confirmed by ultrasonography. Biopsy of this mass 02/10/2013 showed an invasive ductal carcinoma, grade 1, with a prognostic panel pending. Breast MRI has been scheduled for 02/24/2013  The patient's subsequent history is as detailed below  INTERVAL HISTORY: [name] returns today for follow up of her breast cancer. She is tolerating anastrozole well.Hot flashes and vaginal dryness are not a major issue. She never developed the arthralgias or myalgias that many patients can experience on this medication. She obtains it at a good price.    REVIEW OF SYSTEMS: Athenia's worst problem is the pain in the right leg and back, which keeps her from able to walk long distances. Of course she uses a cane. She does have a stationary bike at home and uses that several times a week, 15-20 minutes at a time. She is worried about bone density issues because she knows weightbearing exercises what she needs. Aside from that it detailed review of systems today was stable.   PAST MEDICAL HISTORY: Past Medical History:  Diagnosis Date  . Allergy   . Arthritis    "maybe in my lower back" (08/27/2012)  . 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 CKD (chronic kidney disease), stage I   . Coronary artery disease    NSTEMI in 05/2009;  LHC with 2 v CAD - > CABG (LIMA-LAD, SVG-diagonal, SVG-OM1/OM 2).    . Family history of anesthesia complication    Mom has a hard time to wake up  . Foot ulcer ([place])    "I've had them on both feet" (08/27/2012)  . Gouty arthritis    "right index finger" (08/27/2012)  . History of radiation therapy 06/09/13-07/06/13   left breast 50Gy  . Hyperlipidemia   . Hypertension   . Ischemic cardiomyopathy    Echo 07/2009: Mild LVH, EF 40-45%, inferior and posterior HK, grade 1 diastolic dysfunction, moderate MAC, mild to moderate MR, mild LAE  . Neuropathy (HCC)    Hx; of B/L feet  . NSTEMI (non-ST elevated myocardial infarction) ([place]) 05/23/2009   Archie Endo 05/23/2009 (08/27/2012)  . Osteomyelitis of foot ([place])   . PAD (peripheral artery disease) ([place])   . Sinus headache    "occasionally" (08/27/2012)  . Type II diabetes mellitus ([place])   . Type II diabetes mellitus with neurological manifestations, uncontrolled ([place]) 02/17/2014  . Type II or unspecified type diabetes mellitus without mention of complication, not stated as uncontrolled   . Vitamin D deficiency     PAST SURGICAL HISTORY: Past Surgical History:  Procedure Laterality Date  . AMPUTATION Right 08/27/2012   Procedure: AMPUTATION RAY;  Surgeon: Newt Minion, MD;  Location: [place];  Service: Orthopedics;  Laterality: Right;  Right Foot 5th Ray Amputation  . AMPUTATION Left 07/08/2013   Procedure: AMPUTATION RAY;  Surgeon: Newt Minion, MD;  Location: [place];  Service: Orthopedics;  Laterality: Left;  Left Foot 2nd Ray  Amputation  . AMPUTATION RAY Right 08/27/2012   5th ray/notes 08/27/2012  . BREAST LUMPECTOMY  04/20/2013   with biopsy      DR WAKEFIELD  . BREAST LUMPECTOMY WITH NEEDLE LOCALIZATION AND AXILLARY SENTINEL LYMPH NODE BX Left 04/20/2013   Procedure: LEFT BREAST WIRE GUIDED LUMPECTOMY AND AXILLARY SENTINEL LYMPH NODE BX;  Surgeon: Rolm Bookbinder, MD;  Location: [place];  Service: General;  Laterality: Left;  . CARDIAC CATHETERIZATION  05/24/2009   Archie Endo 05/24/2009 (08/27/2012)  . CATARACT EXTRACTION  W/ INTRAOCULAR LENS  IMPLANT, BILATERAL  2000's  . COLONOSCOPY W/ BIOPSIES AND POLYPECTOMY  2010  . CORONARY ARTERY BYPASS GRAFT  2011   "CABG X4" (08/27/2012)  . DENTAL SURGERY  04/30/11   "1 implant" (08/27/2012)  . DILATION AND CURETTAGE OF UTERUS    . EYE SURGERY    . FINGER SURGERY Right    "index finger; turned out to be gout" (08/27/2012)    FAMILY HISTORY Family History  Problem Relation Age of Onset  . Kidney cancer Brother 71  . Hypertension Brother   . Breast cancer Sister 44    LCIS; BRCA negative  . Throat cancer Father 31    smoker  . Breast cancer Maternal Aunt     dx in her 3s  . Breast cancer Sister 43    Lobular breast cancer   patient's father died at the age of 23 from throat cancer. The patient's mother died at the age of 60 from "many medical problems". The patient had 4 brothers, 3 sisters. One brother had kidney cancer diagnosed at age 74. One sister was diagnosed with breast cancer at age 34 and died 10-24-2012. A second sister was diagnosed with breast cancer at age 48. There is also one of the patient's mother's 5 sisters diagnosed with breast cancer remotely.  GYNECOLOGIC HISTORY:  Menarche age 16 am first live birth age 68, the patient is [place] P70. (She had a twin pregnancy, [name] and [name], delivered prematurely, with the babies dying after a half day). She underwent menopause in her late 61s and used hormone replacement for a few months.  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 Son [name] lives in [place] where he works as a Proofreader. The patient has 3 grandchildren. She attends [facility] [employer] in [place]: In place   HEALTH MAINTENANCE: Social History  Substance Use Topics  . Smoking status: Never Smoker  . Smokeless tobacco: Never Used  . Alcohol use No     Comment: rarely drinks wine     Colonoscopy: 2010  PAP:  2010  Bone density: 10/21/2007 at [facility], normal  Lipid panel:  Allergies  Allergen Reactions  . Atenolol Other (See Comments)    Exacerbates gout  . Contrast Media [Iodinated Diagnostic Agents] Rash  . Latex Rash  . Omnipaque [Iohexol] Rash    Current Outpatient Prescriptions  Medication Sig Dispense Refill  . allopurinol (ZYLOPRIM) 100 MG tablet TAKE 1 TABLET TWICE DAILY 90 tablet 0  . aspirin 325 MG tablet Take 325 mg by mouth at bedtime.     . BD PEN NEEDLE NANO U/F 32G X 4 MM MISC USE TO INJECT TWICE DAILY LANTUS INJECTIONS 180 each 1  . carvedilol (COREG) 12.5 MG tablet TAKE 1 TABLET TWICE DAILY FOR BLOOD PRESSURE 180 tablet 3  . cholecalciferol (VITAMIN D) 1000 units  tablet Take 1,000 Units by mouth daily.    Marland Kitchen glimepiride (AMARYL) 4 MG tablet TAKE 1 TABLET EVERY DAY WITH BREAKFAST 90 tablet 0  . glucose blood test strip Check sugars 3x a day-DX-E10.22 200 each 11  . hydrochlorothiazide (HYDRODIURIL) 12.5 MG tablet TAKE 1 TABLET EVERY DAY 90 tablet 1  . LANTUS SOLOSTAR 100 UNIT/ML Solostar Pen INJECT  50 UNITS SUBCUTANEOUSLY EVERY DAY  OR AS DIRECTED (DISCARD AND BEGIN NEW PEN EVERY 28 DAYS) 45 mL 0  . metFORMIN (GLUCOPHAGE-XR) 500 MG 24 hr tablet TAKE 2 TABLETS TWICE DAILY AFTER MEALS FOR DIABETES 360 tablet 3  . Misc. Devices MISC Custom molded inserts for diabetic shoes - ICD10: E11.41. Detailed foot exam performed at the time of the visit on 02/17/2014. Patient has neuropathy, multiple digit amputations and calluses. 2 each 1  . Polyvinyl Alcohol-Povidone (REFRESH OP) Place 1 drop into both eyes daily as needed (dry eyes).    . rosuvastatin (CRESTOR) 20 MG tablet TAKE 1 TABLET  DAILY 90 tablet 1  . valsartan (DIOVAN) 320 MG tablet TAKE 1 TABLET EVERY MORNING FOR BLOOD PRESSURE 90 tablet 1   No current facility-administered medications for this visit.     OBJECTIVE: Middle-aged white woman In no acute distress Vitals:   12/27/15 1318  BP: (!) 158/64  Pulse: 81  Resp: 18   Temp: 99.4 F (37.4 C)     Body mass index is 28.81 kg/m.    ECOG FS:1 - Symptomatic but completely ambulatory  Sclerae unicteric, EOMs intact Oropharynx clear and moist No cervical or supraclavicular adenopathy Lungs no rales or rhonchi Heart regular rate and rhythm Abd soft, Obese, nontender, positive bowel sounds MSK no focal spinal tenderness, no upper extremity lymphedema Neuro: nonfocal, well oriented, appropriate affect Breasts: The right breast is unremarkable. The left breast is status post lumpectomy and radiation. There is no evidence of local recurrence. The left axilla is benign.     LAB RESULTS:  CMP     Component Value Date/Time   NA 140 12/17/2015 1153   NA 138 05/08/2015 1411   K 5.9 (H) 12/17/2015 1153   K 5.0 05/08/2015 1411   CL 105 12/17/2015 1153   CO2 25 12/17/2015 1153   CO2 25 05/08/2015 1411   GLUCOSE 128 (H) 12/17/2015 1153   GLUCOSE 252 (H) 05/08/2015 1411   BUN 53 (H) 12/17/2015 1153   BUN 52.4 (H) 05/08/2015 1411   CREATININE 2.47 (H) 12/17/2015 1153   CREATININE 2.2 (H) 05/08/2015 1411   CALCIUM 10.6 (H) 12/17/2015 1153   CALCIUM 11.3 (H) 05/08/2015 1411   PROT 6.9 12/17/2015 1153   PROT 7.2 05/08/2015 1411   ALBUMIN 4.2 12/17/2015 1153   ALBUMIN 3.5 05/08/2015 1411   AST 19 12/17/2015 1153   AST 16 05/08/2015 1411   ALT 23 12/17/2015 1153   ALT 20 05/08/2015 1411   ALKPHOS 72 12/17/2015 1153   ALKPHOS 72 05/08/2015 1411   BILITOT 0.4 12/17/2015 1153   BILITOT 0.31 05/08/2015 1411   GFRNONAA 19 (L) 12/17/2015 1153   GFRAA 22 (L) 12/17/2015 1153    I No results found for: SPEP  Lab Results  Component Value Date   WBC 7.7 12/27/2015   NEUTROABS 4.9 12/27/2015   HGB 12.1 12/27/2015   HCT 37.1 12/27/2015   MCV 89.4 12/27/2015   PLT 226 12/27/2015      Chemistry      Component Value Date/Time   NA 140 12/17/2015 1153   NA  138 05/08/2015 1411   K 5.9 (H) 12/17/2015 1153   K 5.0 05/08/2015 1411   CL 105 12/17/2015  1153   CO2 25 12/17/2015 1153   CO2 25 05/08/2015 1411   BUN 53 (H) 12/17/2015 1153   BUN 52.4 (H) 05/08/2015 1411   CREATININE 2.47 (H) 12/17/2015 1153   CREATININE 2.2 (H) 05/08/2015 1411      Component Value Date/Time   CALCIUM 10.6 (H) 12/17/2015 1153   CALCIUM 11.3 (H) 05/08/2015 1411   ALKPHOS 72 12/17/2015 1153   ALKPHOS 72 05/08/2015 1411   AST 19 12/17/2015 1153   AST 16 05/08/2015 1411   ALT 23 12/17/2015 1153   ALT 20 05/08/2015 1411   BILITOT 0.4 12/17/2015 1153   BILITOT 0.31 05/08/2015 1411       No results found for: LABCA2  No components found for: AEWYB749  No results for input(s): INR in the last 168 hours.  Urinalysis    Component Value Date/Time   COLORURINE YELLOW 02/14/2015 1600   APPEARANCEUR CLEAR 02/14/2015 1600   LABSPEC 1.008 02/14/2015 1600   PHURINE 6.0 02/14/2015 1600   GLUCOSEU TRACE (A) 02/14/2015 1600   HGBUR 1+ (A) 02/14/2015 1600   BILIRUBINUR NEGATIVE 02/14/2015 1600   KETONESUR NEGATIVE 02/14/2015 1600   PROTEINUR 2+ (A) 02/14/2015 1600   UROBILINOGEN 0.2 05/30/2009 1500   NITRITE NEGATIVE 02/14/2015 1600   LEUKOCYTESUR TRACE (A) 02/14/2015 1600    STUDIES: Diagnostic bilateral mammography at [facility] 12/25/2015 found the breast density to be category B. There was no evidence of recurrent disease.  ASSESSMENT: 70 y.o. BRCA negative [place] woman status post left breast upper inner quadrant biopsy 02/10/2013 for a clinical T1a N0, stage IA invasive ductal carcinoma, grade 1, with a prognostic panel pending  (1) status post left lumpectomy and sentinel lymph node sampling 04/20/2013 for a pT1a pN0, stage IA invasive ductal carcinoma, grade 1, with negative margins  (2) completed adjuvant radiation 07/06/2013  (3) started anastrozole 09/05/2013;   (a) DEXA scan 07/28/2013 was normal with a T score of -0.3  PLAN: Ayn is now 2-1/2 years out from definitive surgery for her breast cancer with no evidence of disease  recurrence. This is very favorable  She is tolerating the anastrozole well, and the plan is to continue that for a total of 5 years  I am adding a bone density to her next mammogram, September 2018.  She knows to call for any problems that may develop before her next visit here.       Marland KitchenChauncey Cruel, MD   12/27/2015 1:35 PM

## 2016-01-01 ENCOUNTER — Other Ambulatory Visit: Payer: Self-pay | Admitting: Internal Medicine

## 2016-01-02 ENCOUNTER — Other Ambulatory Visit: Payer: Self-pay | Admitting: Oncology

## 2016-01-02 NOTE — Progress Notes (Signed)
Dear Dr. Jana Hakim,  This pt was lost for f/u for me - I have not seen her in ~2 years.  Sincerely,  Philemon Kingdom MD

## 2016-01-28 ENCOUNTER — Other Ambulatory Visit: Payer: Self-pay | Admitting: Internal Medicine

## 2016-02-10 ENCOUNTER — Encounter: Payer: Self-pay | Admitting: *Deleted

## 2016-02-11 DIAGNOSIS — N2581 Secondary hyperparathyroidism of renal origin: Secondary | ICD-10-CM | POA: Diagnosis not present

## 2016-02-11 DIAGNOSIS — N390 Urinary tract infection, site not specified: Secondary | ICD-10-CM | POA: Diagnosis not present

## 2016-02-11 DIAGNOSIS — L84 Corns and callosities: Secondary | ICD-10-CM | POA: Diagnosis not present

## 2016-02-11 DIAGNOSIS — N184 Chronic kidney disease, stage 4 (severe): Secondary | ICD-10-CM | POA: Diagnosis not present

## 2016-02-11 DIAGNOSIS — E1129 Type 2 diabetes mellitus with other diabetic kidney complication: Secondary | ICD-10-CM | POA: Diagnosis not present

## 2016-02-11 DIAGNOSIS — Z853 Personal history of malignant neoplasm of breast: Secondary | ICD-10-CM | POA: Diagnosis not present

## 2016-02-11 DIAGNOSIS — D649 Anemia, unspecified: Secondary | ICD-10-CM | POA: Diagnosis not present

## 2016-02-11 DIAGNOSIS — I129 Hypertensive chronic kidney disease with stage 1 through stage 4 chronic kidney disease, or unspecified chronic kidney disease: Secondary | ICD-10-CM | POA: Diagnosis not present

## 2016-02-20 ENCOUNTER — Encounter: Payer: Self-pay | Admitting: Podiatry

## 2016-02-20 ENCOUNTER — Ambulatory Visit (INDEPENDENT_AMBULATORY_CARE_PROVIDER_SITE_OTHER): Payer: Medicare Other | Admitting: Podiatry

## 2016-02-20 VITALS — BP 170/78 | HR 92 | Ht 65.0 in | Wt 173.0 lb

## 2016-02-20 DIAGNOSIS — E11621 Type 2 diabetes mellitus with foot ulcer: Secondary | ICD-10-CM

## 2016-02-20 DIAGNOSIS — L97522 Non-pressure chronic ulcer of other part of left foot with fat layer exposed: Secondary | ICD-10-CM

## 2016-02-20 NOTE — Progress Notes (Signed)
Subjective:     Patient ID: [name], female   DOB: Jul 06, 1945, 70 y.o.   MRN: [id number]  HPIThis patient presents to the office for continued evaluation of her diabetic ulcer left foot.  She presents to the office today saying she has no pain, no drainage from her ulcer.  She says the ulcer has re-opened and has been open for a long time. She has learned to deal with this ulcer on her own. This patient is diabetic with absent ankle LOPS.  I had poor of previously seen her in 2016 and closed. The ulcer which was due to a severe contracture of the hallux on the left foot. She says she presents the office per referral from her kidney doctor .  She presents for evaluation and treatment   Review of Systems     Objective:   Physical Exam  Objective:   Physical Exam GENERAL APPEARANCE: Alert, conversant. Appropriately groomed. No acute distress.  VASCULAR: Pedal pulses are not palpable at [facility] and PT bilateral. Capillary refill time is immediate to all digits, Cold feet noted.  NEUROLOGIC: sensation is absent to 5.07 monofilament at 5/5 sites bilateral. Light touch is intact bilateral, Muscle strength normal.  MUSCULOSKELETAL: acceptable muscle strength, tone and stability bilateral. Intrinsic muscluature intact bilateral. Rectus appearance of foot and digits noted bilateral. Severe contracture HAV 1st MPJ left foot. DERMATOLOGIC: skin color, texture, and turgor are within normal limits. no interdigital maceration noted. No open lesions present. Digital nails are asymptomatic. No drainage noted. ULCER  The diabetic ulcer under her big toe joint, left foot measures 5 mm x 5 mm no evidence of any redness, swelling or infection. No pain associated with the ulcer at this time             Assessment:     Diabetic ulcer left foot    Plan:     Debride tissue at diabetic ulcer.  Neosporin and dry sterile dressing was applied. We discussed the use of surgical shoes as she ambulates.  We also decided to send her to [name]. We plan to have [name] evaluate the ulcer and recommend treatment for the closure of the ulcer.     Gardiner Barefoot DPM

## 2016-02-26 ENCOUNTER — Encounter: Payer: Self-pay | Admitting: Internal Medicine

## 2016-03-06 ENCOUNTER — Ambulatory Visit (INDEPENDENT_AMBULATORY_CARE_PROVIDER_SITE_OTHER): Payer: Medicare Other | Admitting: Podiatry

## 2016-03-06 ENCOUNTER — Ambulatory Visit (INDEPENDENT_AMBULATORY_CARE_PROVIDER_SITE_OTHER): Payer: Medicare Other

## 2016-03-06 ENCOUNTER — Encounter: Payer: Self-pay | Admitting: Podiatry

## 2016-03-06 DIAGNOSIS — E11621 Type 2 diabetes mellitus with foot ulcer: Secondary | ICD-10-CM

## 2016-03-06 DIAGNOSIS — L97522 Non-pressure chronic ulcer of other part of left foot with fat layer exposed: Secondary | ICD-10-CM

## 2016-03-06 MED ORDER — CEPHALEXIN 500 MG PO CAPS: 500.0000 mg | ORAL_CAPSULE | Freq: Three times a day (TID) | ORAL | 2 refills | Status: DC

## 2016-03-07 ENCOUNTER — Telehealth: Payer: Self-pay | Admitting: *Deleted

## 2016-03-07 DIAGNOSIS — R0989 Other specified symptoms and signs involving the circulatory and respiratory systems: Secondary | ICD-10-CM

## 2016-03-07 DIAGNOSIS — L97929 Non-pressure chronic ulcer of unspecified part of left lower leg with unspecified severity: Secondary | ICD-10-CM

## 2016-03-07 NOTE — Telephone Encounter (Addendum)
-----   Message from [name], DPM sent at 03/07/2016  7:52 AM EST ----- Can you please order arterial studies for her given nonhealing ulcer and decreased pulses? I asked pt which cardiovascular practice she was associated, and she said [facility] on [facility]. Orders faxed to [facility].

## 2016-03-07 NOTE — Telephone Encounter (Signed)
[name] ordered Calcium algonate with silver for daily dressing changes of left sub 1st metatarsal diabetic ulcer, 0.8 x 0.7 x 0.1cm with moderate to heavy exudate.

## 2016-03-10 ENCOUNTER — Other Ambulatory Visit: Payer: Self-pay | Admitting: Internal Medicine

## 2016-03-10 NOTE — Telephone Encounter (Signed)
-----   Message from Rufina Falco sent at 03/10/2016  1:01 PM EST ----- Regarding: Order in [place]  Does [name] only want arterial duplex?   (this will duplex the arteries and does not include ABI's).    Our accredition board requires and ABI when an arterial duplex is ordered.  Rip Harbour

## 2016-03-11 NOTE — Progress Notes (Signed)
Subjective: 70 year old female presents the office today for concerns of a chronic wound to the left foot. She has been treated by Dr. Prudence Davidson and was referred to me for further evaluation. She denies any drainage or pus coming from the wound. She has been soaking in Epson salts.she has continued with a regular shoe. She has been applying neosporin to the wound dailyDenies any systemic complaints such as fevers, chills, nausea, vomiting. No acute changes since last appointment, and no other complaints at this time.   Objective: AAO x3, NAD DP/PT pulses decreased bilaterally, CRT less than 3 seconds Protective sensation absent with Derrel Nip monofilament On the left foot submetatarsal one is an ulceration with a surrounding hyperkeratotic periwound measuring 0.5 x 0.5 cm and there is no probing, undermining or tunneling. There is no swelling erythema, ascending cellulitis, fluctuance, crepitus, malodor. HAV is present and there is prominence of the plantar medial aspect of the foot with atrophy of the fat pad. There is no clinical signs of infection. No areas of pinpoint bony tenderness or pain with vibratory sensation. MMT 5/5, ROM WNL. No edema, erythema, increase in warmth to bilateral lower extremities.  No open lesions or pre-ulcerative lesions.  No pain with calf compression, swelling, warmth, erythema  Assessment: 70 year old female nonhealing wound left foot  Plan: -All treatment options discussed with the patient including all alternatives, risks, complications.  -Although there are no overt signs of infection we will start Keflex due to nonhealing wound in case of infection. -Will order arterial studies -Ordered  collagen silver dressing changes daily. This was sent to [facility].  -Surgical shoe and offloading pads were dispensed. -Monitor for signs or symptoms of infection to the ER should any occur. -Patient encouraged to call the office with any questions, concerns, change in  symptoms.   Celesta Gentile, DPM

## 2016-03-18 ENCOUNTER — Ambulatory Visit (INDEPENDENT_AMBULATORY_CARE_PROVIDER_SITE_OTHER): Payer: Medicare Other | Admitting: Internal Medicine

## 2016-03-18 ENCOUNTER — Encounter: Payer: Self-pay | Admitting: Internal Medicine

## 2016-03-18 VITALS — BP 144/72 | HR 82 | Temp 98.4°F | Resp 18 | Ht 65.0 in | Wt 168.0 lb

## 2016-03-18 DIAGNOSIS — Z79899 Other long term (current) drug therapy: Secondary | ICD-10-CM

## 2016-03-18 DIAGNOSIS — N183 Chronic kidney disease, stage 3 unspecified: Secondary | ICD-10-CM

## 2016-03-18 DIAGNOSIS — I10 Essential (primary) hypertension: Secondary | ICD-10-CM

## 2016-03-18 DIAGNOSIS — E1140 Type 2 diabetes mellitus with diabetic neuropathy, unspecified: Secondary | ICD-10-CM | POA: Diagnosis not present

## 2016-03-18 DIAGNOSIS — E119 Type 2 diabetes mellitus without complications: Secondary | ICD-10-CM | POA: Diagnosis not present

## 2016-03-18 DIAGNOSIS — E559 Vitamin D deficiency, unspecified: Secondary | ICD-10-CM | POA: Diagnosis not present

## 2016-03-18 DIAGNOSIS — E782 Mixed hyperlipidemia: Secondary | ICD-10-CM

## 2016-03-18 DIAGNOSIS — Z794 Long term (current) use of insulin: Secondary | ICD-10-CM | POA: Diagnosis not present

## 2016-03-18 DIAGNOSIS — E1165 Type 2 diabetes mellitus with hyperglycemia: Secondary | ICD-10-CM | POA: Diagnosis not present

## 2016-03-18 DIAGNOSIS — E1122 Type 2 diabetes mellitus with diabetic chronic kidney disease: Secondary | ICD-10-CM

## 2016-03-18 DIAGNOSIS — IMO0002 Reserved for concepts with insufficient information to code with codable children: Secondary | ICD-10-CM

## 2016-03-18 NOTE — Patient Instructions (Signed)
Please wake up and take your blood sugar first thing in the morning.  Please take 20 units of insulin in the morning and then you should immediately eat your breakfast.  Please take 1/2 tablet of your glimeperide (amaryl).    Please take the 1/2 tablet of glimeperide (amaryl) with your lunch.  Prior to dinner please take the 25 units of insulin prior to dinner.  Please eat your dinner.  Please take a 1/2 tablet with the glimeperide (amaryl).  Please check your blood sugars 2 hours after dinner.

## 2016-03-18 NOTE — Progress Notes (Signed)
Assessment and Plan:  Hypertension:  -Continue medication -monitor blood pressure at home. -Continue DASH diet -Reminder to go to the ER if any CP, SOB, nausea, dizziness, severe HA, changes vision/speech, left arm numbness and tingling and jaw pain.  Cholesterol - Continue diet and exercise -Check cholesterol.   Diabetes with diabetic chronic kidney disease  -split dosing of lantus to 20 units in the morning and 25 units with dinner.  Start 1/2 tablet of amaryl at all meals with intention of possibly increasing dose.  May need to switch to 70/30 insulin and drop sulfonyurea secondary to hypoglycemic risk.   -keep CBG logs -have her follow with Dr. Melford Aase in January -not candidate for DPP4 or SGLT2 due to kidney function.  Other option would be GLP1. -Continue diet and exercise.  -Check A1C  Vitamin D Def -check level -continue medications.   Continue diet and meds as discussed. Further disposition pending results of labs. Discussed med's effects and SE's.    HPI 70 y.o. female  presents for 3 month follow up with hypertension, hyperlipidemia, diabetes and vitamin D deficiency.   Her blood pressure has been controlled at home, today their BP is BP: (!) 144/72.She does not workout. She denies chest pain, shortness of breath, dizziness.   She is on cholesterol medication and denies myalgias. Her cholesterol is at goal. The cholesterol was:  12/17/2015: Cholesterol 119; HDL 27; LDL Cholesterol 21; Triglycerides 353   She has not been working on diet and exercise for diabetes with diabetic chronic kidney disease, she is on bASA, she is on ACE/ARB, and denies  hyperglycemia, hypoglycemia  and weight loss. Last A1C was: 12/17/2015: Hgb A1c MFr Bld 9.9.  She was recently seen by nephrologists and due to her kidney functions she was taken completely off of metformin.  She reports that she has been cutting out a lot of pasta and has been trying to work on diet.  She is limited to exercise  because she has a bad diabetic ulcer on her foot.  She has been following with Dr. Sharyon Cable at podiatry.  She did do a course of keflex.     Patient is on Vitamin D supplement. No results found for requested labs within last 8760 hours.    Current Medications:  Current Outpatient Prescriptions on File Prior to Visit  Medication Sig Dispense Refill  . allopurinol (ZYLOPRIM) 100 MG tablet TAKE 1 TABLET (100 MG TOTAL) BY MOUTH 2 (TWO) TIMES DAILY. 180 tablet 1  . aspirin 325 MG tablet Take 325 mg by mouth at bedtime.     . BD PEN NEEDLE NANO U/F 32G X 4 MM MISC USE TO INJECT TWICE DAILY LANTUS INJECTIONS 180 each 1  . carvedilol (COREG) 12.5 MG tablet TAKE 1 TABLET TWICE DAILY FOR BLOOD PRESSURE 180 tablet 3  . glimepiride (AMARYL) 4 MG tablet TAKE 1 TABLET EVERY DAY WITH BREAKFAST (NEED MD APPOINTMENT) 90 tablet 0  . glucose blood test strip Check sugars 3x a day-DX-E10.22 200 each 11  . hydrochlorothiazide (HYDRODIURIL) 12.5 MG tablet TAKE 1 TABLET EVERY DAY 90 tablet 1  . LANTUS SOLOSTAR 100 UNIT/ML Solostar Pen INJECT  50 UNITS SUBCUTANEOUSLY EVERY DAY  OR AS DIRECTED (DISCARD AND BEGIN NEW PEN EVERY 28 DAYS) 45 mL 0  . Misc. Devices MISC Custom molded inserts for diabetic shoes - ICD10: E11.41. Detailed foot exam performed at the time of the visit on 02/17/2014. Patient has neuropathy, multiple digit amputations and calluses. 2 each 1  .  Polyvinyl Alcohol-Povidone (REFRESH OP) Place 1 drop into both eyes daily as needed (dry eyes).    . rosuvastatin (CRESTOR) 20 MG tablet TAKE 1 TABLET EVERY DAY 90 tablet 1  . valsartan (DIOVAN) 320 MG tablet TAKE 1 TABLET EVERY MORNING FOR BLOOD PRESSURE 90 tablet 1   No current facility-administered medications on file prior to visit.    Medical History:  Past Medical History:  Diagnosis Date  . Allergy   . Arthritis    "maybe in my lower back" (08/27/2012)  . 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  CKD (chronic kidney disease), stage I   . Coronary artery disease    NSTEMI in 05/2009;  LHC with 2 v CAD - > CABG (LIMA-LAD, SVG-diagonal, SVG-OM1/OM 2).   . Family history of anesthesia complication    Mom has a hard time to wake up  . Foot ulcer ([place])    "I've had them on both feet" (08/27/2012)  . Gouty arthritis    "right index finger" (08/27/2012)  . History of radiation therapy 06/09/13-07/06/13   left breast 50Gy  . Hyperlipidemia   . Hypertension   . Ischemic cardiomyopathy    Echo 07/2009: Mild LVH, EF 40-45%, inferior and posterior HK, grade 1 diastolic dysfunction, moderate MAC, mild to moderate MR, mild LAE  . Neuropathy ([facility])    Hx; of B/L feet  . NSTEMI (non-ST elevated myocardial infarction) ([place]) 05/23/2009   Archie Endo 05/23/2009 (08/27/2012)  . Osteomyelitis of foot ([place])   . PAD (peripheral artery disease) ([place])   . Sinus headache    "occasionally" (08/27/2012)  . Type II diabetes mellitus ([place])   . Type II diabetes mellitus with neurological manifestations, uncontrolled ([place]) 02/17/2014  . Type II or unspecified type diabetes mellitus without mention of complication, not stated as uncontrolled   . Vitamin D deficiency    Allergies:  Allergies  Allergen Reactions  . Atenolol Other (See Comments)    Exacerbates gout  . Contrast Media [Iodinated Diagnostic Agents] Rash  . Latex Rash  . Omnipaque [Iohexol] Rash     Review of Systems:  Review of Systems  Constitutional: Negative for chills, fever and malaise/fatigue.  HENT: Negative for congestion, ear pain and sore throat.   Eyes: Negative.   Respiratory: Negative for cough, shortness of breath and wheezing.   Cardiovascular: Negative for chest pain, palpitations and leg swelling.  Gastrointestinal: Negative for abdominal pain, blood in stool, constipation, diarrhea, heartburn and melena.  Genitourinary: Negative.   Skin: Negative.   Neurological: Negative for dizziness, sensory change, loss of consciousness and  headaches.  Psychiatric/Behavioral: Negative for depression. The patient is not nervous/anxious and does not have insomnia.     Family history- Review and unchanged  Social history- Review and unchanged  Physical Exam: BP (!) 144/72   Pulse 82   Temp 98.4 F (36.9 C) (Temporal)   Resp 18   Ht 5\' 5"  (1.651 m)   Wt 168 lb (76.2 kg)   BMI 27.96 kg/m  Wt Readings from Last 3 Encounters:  03/18/16 168 lb (76.2 kg)  02/20/16 173 lb (78.5 kg)  12/27/15 173 lb 1.6 oz (78.5 kg)   General Appearance: Well nourished well developed, non-toxic appearing, in no apparent distress. Eyes: PERRLA, EOMs, conjunctiva no swelling or erythema ENT/Mouth: Ear canals clear with no erythema, swelling, or discharge.  TMs normal bilaterally, oropharynx clear, moist, with no exudate.   Neck: Supple, thyroid normal, no JVD, no cervical  adenopathy.  Respiratory: Respiratory effort normal, breath sounds clear A&P, no wheeze, rhonchi or rales noted.  No retractions, no accessory muscle usage Cardio: RRR with no MRGs. No noted edema.  Abdomen: Soft, + BS.  Non tender, no guarding, rebound, hernias, masses. Musculoskeletal: Full ROM, 5/5 strength, Normal and Antalgic gait  She does have a surgical shoe on her left foot.   Skin: Warm, dry without rashes, lesions, ecchymosis.  Neuro: Awake and oriented X 3, Cranial nerves intact. No cerebellar symptoms.  Psych: normal affect, Insight and Judgment appropriate.    Starlyn Skeans, PA-C 3:15 PM [facility]

## 2016-03-20 ENCOUNTER — Encounter: Payer: Self-pay | Admitting: Podiatry

## 2016-03-20 ENCOUNTER — Other Ambulatory Visit: Payer: Self-pay | Admitting: Internal Medicine

## 2016-03-20 ENCOUNTER — Ambulatory Visit (INDEPENDENT_AMBULATORY_CARE_PROVIDER_SITE_OTHER): Payer: Medicare Other | Admitting: Podiatry

## 2016-03-20 DIAGNOSIS — E11621 Type 2 diabetes mellitus with foot ulcer: Secondary | ICD-10-CM

## 2016-03-20 DIAGNOSIS — L97522 Non-pressure chronic ulcer of other part of left foot with fat layer exposed: Secondary | ICD-10-CM | POA: Diagnosis not present

## 2016-03-23 NOTE — Progress Notes (Signed)
Subjective: 70 year old female presents the office for follow up evaluation of wound to left foot. She states that she has had minimal drainage coming from the area and denies any pus. She completed her course of Keflex. Overall she feels the wound is healing very well. She denies any redness or warmth to her foot denies any redness streaks. She's been using a collagen silver dressing changes daily. She's been using the surgical shoe with offloading pads. Denies any systemic complaints such as fevers, chills, nausea, vomiting. No acute changes since last appointment, and no other complaints at this time.   Objective: AAO x3, NAD DP/PT pulses palpable bilaterally, CRT less than 3 seconds On the plantar medial aspect of the left foot second metatarsal 1 hyperkeratotic lesion with central ulceration. Upon debrided to the wound today measures 0.3 x 0.3 cm in a superficial. There is no probing, undermining or tunneling. There is no swelling erythema, ascending cellulitis, fluctuance, Is, malodor, drainage or pus. No clinical signs of infection today. No open lesions or pre-ulcerative lesions.  No pain with calf compression, swelling, warmth, erythema  Assessment: Healing wound left foot  Plan: -All treatment options discussed with the patient including all alternatives, risks, complications.  -We'll is upper debrided today with a scalpel to granular healthy tissue. Continue with collagen/silver dressing changes daily. Continue with surgical shoe and offloading pads. We will hold further antibiotics as there is no signs of infection. She is awaiting arterial studies. Monitor for any signs or symptoms of recurrence of infection and call the office to me is any occur go to the emergency room. Follow-up as scheduled or sooner.. -Patient encouraged to call the office with any questions, concerns, change in symptoms.   Celesta Gentile, DPM

## 2016-03-24 ENCOUNTER — Encounter: Payer: Self-pay | Admitting: Internal Medicine

## 2016-04-02 ENCOUNTER — Other Ambulatory Visit: Payer: Self-pay | Admitting: Internal Medicine

## 2016-04-10 ENCOUNTER — Ambulatory Visit: Payer: Medicare Other | Admitting: Podiatry

## 2016-04-16 DIAGNOSIS — E1129 Type 2 diabetes mellitus with other diabetic kidney complication: Secondary | ICD-10-CM | POA: Diagnosis not present

## 2016-04-16 DIAGNOSIS — L97509 Non-pressure chronic ulcer of other part of unspecified foot with unspecified severity: Secondary | ICD-10-CM | POA: Diagnosis not present

## 2016-04-16 DIAGNOSIS — I129 Hypertensive chronic kidney disease with stage 1 through stage 4 chronic kidney disease, or unspecified chronic kidney disease: Secondary | ICD-10-CM | POA: Diagnosis not present

## 2016-04-16 DIAGNOSIS — Z6828 Body mass index (BMI) 28.0-28.9, adult: Secondary | ICD-10-CM | POA: Diagnosis not present

## 2016-04-16 DIAGNOSIS — N184 Chronic kidney disease, stage 4 (severe): Secondary | ICD-10-CM | POA: Diagnosis not present

## 2016-04-16 DIAGNOSIS — E13621 Other specified diabetes mellitus with foot ulcer: Secondary | ICD-10-CM | POA: Diagnosis not present

## 2016-04-17 ENCOUNTER — Ambulatory Visit (INDEPENDENT_AMBULATORY_CARE_PROVIDER_SITE_OTHER): Payer: Medicare Other | Admitting: Podiatry

## 2016-04-17 DIAGNOSIS — L03116 Cellulitis of left lower limb: Secondary | ICD-10-CM

## 2016-04-17 DIAGNOSIS — L97522 Non-pressure chronic ulcer of other part of left foot with fat layer exposed: Secondary | ICD-10-CM

## 2016-04-17 DIAGNOSIS — E11621 Type 2 diabetes mellitus with foot ulcer: Secondary | ICD-10-CM

## 2016-04-17 MED ORDER — DOXYCYCLINE HYCLATE 100 MG PO TABS: 100.0000 mg | ORAL_TABLET | Freq: Two times a day (BID) | ORAL | 0 refills | Status: DC

## 2016-04-17 NOTE — Progress Notes (Signed)
Subjective: 71 year old female presents the office for follow up evaluation of wound to left foot. She was not able to follow-up in 2 weeks because of a funeral and the holidays. She states she believes the wound has been getting worse. She has been using the collagen and silver dressing changes but not daily she admits to. She did not get the arterial studies as she thought the wound was improving. Denies any systemic complaints such as fevers, chills, nausea, vomiting. No acute changes since last appointment, and no other complaints at this time.   Objective: AAO x3, NAD DP/PT pulses palpable bilaterally, CRT less than 3 seconds On the plantar medial aspect of the left foot second metatarsal 1 hyperkeratotic lesion with central ulceration. Upon debrided to the wound today measures 1 x 0.7 x 0.2. There is no probing, undermining or tunneling. There is no mild surrounding erythema but there is no ascending cellulitis, fluctuance, malodor. There is no pus.  No open lesions or pre-ulcerative lesions.  No pain with calf compression, swelling, warmth, erythema  Assessment: Left foot ulcer with mild surrounding erythema.   Plan: -All treatment options discussed with the patient including all alternatives, risks, complications.  -Ulcer was sharply debrided with a scalpel without complications down to healthy, granular tissue. Continue calcium alginate dressing changes DAILY.  -Continue offloading pads.  -Doxycycline  -Monitor for any signs or symptoms of recurrence of infection and call the office to me is any occur go to the emergency room. Follow-up as scheduled or sooner. -Follow-up in 2 weeks or sooner if any problems arise. In the meantime, encouraged to call the office with any questions, concerns, change in symptoms.    Celesta Gentile, DPM

## 2016-04-24 NOTE — Progress Notes (Signed)
S  N  O  W D  A  Y

## 2016-04-25 ENCOUNTER — Ambulatory Visit: Payer: Self-pay | Admitting: Internal Medicine

## 2016-05-01 ENCOUNTER — Encounter: Payer: Self-pay | Admitting: Podiatry

## 2016-05-01 ENCOUNTER — Ambulatory Visit (INDEPENDENT_AMBULATORY_CARE_PROVIDER_SITE_OTHER): Payer: Medicare Other | Admitting: Podiatry

## 2016-05-01 VITALS — BP 178/96 | HR 81 | Resp 18

## 2016-05-01 DIAGNOSIS — E11621 Type 2 diabetes mellitus with foot ulcer: Secondary | ICD-10-CM | POA: Diagnosis not present

## 2016-05-01 DIAGNOSIS — L97524 Non-pressure chronic ulcer of other part of left foot with necrosis of bone: Secondary | ICD-10-CM | POA: Diagnosis not present

## 2016-05-01 MED ORDER — CLINDAMYCIN HCL 300 MG PO CAPS: 300.0000 mg | ORAL_CAPSULE | Freq: Three times a day (TID) | ORAL | 2 refills | Status: DC

## 2016-05-01 MED ORDER — CIPROFLOXACIN HCL 500 MG PO TABS: 500.0000 mg | ORAL_TABLET | Freq: Two times a day (BID) | ORAL | 0 refills | Status: DC

## 2016-05-02 NOTE — Progress Notes (Signed)
Subjective: 71 year old female presents the office for follow up evaluation of wound to left foot. She states the wound is looking better. She completed a course of antibiotics. She denies any swelling redness or red streaks is denies any pain. She has not is any drainage or possibly from the wound. She discontinued the surgical shoe. She's been using the collagen/lower dressing changes daily more consistent basis. Denies any systemic complaints such as fevers, chills, nausea, vomiting. No acute changes since last appointment, and no other complaints at this time.   Objective: AAO x3, NAD DP/PT pulses palpable bilaterally, CRT less than 3 seconds On the plantar medial aspect of the left foot second metatarsal 1 hyperkeratotic lesion with central ulceration. Upon debrided to the wound today measures 1 x 0.5 x 0.4. The wound is deeper today and I'm able to visualize bone to the wound. There is no drainage or pus expressed. There is no ascending cellulitis. There is no malodor. There is no undermining or tunneling to the wound. Bunion is present with to flexion of the first MPJ is also and pressure to the plantar medial aspect of the first metatarsophalangeal joint. No open lesions or pre-ulcerative lesions.  No pain with calf compression, swelling, warmth, erythema      Assessment: Left foot ulcer with worsening and exposed bone.   Plan: -All treatment options discussed with the patient including all alternatives, risks, complications.  -Also sharply debrided without complications. -Given his good bone I recommended surgery to at least shaved bone and remove this is a bone. I will do this in order to help prevent amputation as she is adamant she does not want have the toe amputation. She after long discussion, wishes to hold off on surgery and see how it goes over the next week. I would like to go ahead and do the surgery to help prevent any worsening but she would like to hold off. I discussed  risks and complications of this. We will start clindamycin and ciprofloxacin. We will switch to Promogran Ag dressing changes and I have her some today (Lot number [id number] exp 5-19). -Continue with surgical shoe. -Monitor for any signs or symptoms of worsening infection go to ER medially shoe any occur. Otherwise I'll see her back in 1 week.  Celesta Gentile, DPM -Ulcer was sharply debrided with a scalpel without complications down to healthy, granular tissue. Continue calcium alginate dressing changes DAILY.  -Continue offloading pads.  -Doxycycline  -Monitor for any signs or symptoms of recurrence of infection and call the office to me is any occur go to the emergency room. Follow-up as scheduled or sooner. -Follow-up in 2 weeks or sooner if any problems arise. In the meantime, encouraged to call the office with any questions, concerns, change in symptoms.    Celesta Gentile, DPM

## 2016-05-08 ENCOUNTER — Ambulatory Visit (INDEPENDENT_AMBULATORY_CARE_PROVIDER_SITE_OTHER): Payer: Medicare Other

## 2016-05-08 ENCOUNTER — Encounter: Payer: Self-pay | Admitting: Podiatry

## 2016-05-08 ENCOUNTER — Ambulatory Visit (INDEPENDENT_AMBULATORY_CARE_PROVIDER_SITE_OTHER): Payer: Medicare Other | Admitting: Podiatry

## 2016-05-08 DIAGNOSIS — L97522 Non-pressure chronic ulcer of other part of left foot with fat layer exposed: Secondary | ICD-10-CM | POA: Diagnosis not present

## 2016-05-08 DIAGNOSIS — M86172 Other acute osteomyelitis, left ankle and foot: Secondary | ICD-10-CM | POA: Diagnosis not present

## 2016-05-08 DIAGNOSIS — E08621 Diabetes mellitus due to underlying condition with foot ulcer: Secondary | ICD-10-CM | POA: Diagnosis not present

## 2016-05-08 NOTE — Patient Instructions (Signed)

## 2016-05-12 NOTE — Progress Notes (Signed)
Subjective: 71 year old female presents the office for follow up evaluation of wound to left foot. She states that she is hoping that she is doing better. She is continued on antibiotics. She is continuing with daily dressing changes with [facility] as well. She is not noticed any pus or any increase in redness. Denies any red streaks. Denies any systemic complaints such as fevers, chills, nausea, vomiting. No acute changes since last appointment, and no other complaints at this time.   Objective: AAO x3, NAD DP/PT pulses palpable bilaterally, CRT less than 3 seconds On the plantar medial aspect of the left foot second metatarsal 1 hyperkeratotic lesion with central ulceration. Upon debrided to the wound today measures 1 x 0.5 x 0.4 which is about the same size. I'm still easily able to palpate bone through the wound. Bunion is present with to flexion of the first MPJ is also and pressure to the plantar medial aspect of the first metatarsophalangeal joint. No open lesions or pre-ulcerative lesions.  No pain with calf compression, swelling, warmth, erythema  Assessment: Left foot ulcer with worsening and exposed bone.   Plan: -All treatment options discussed with the patient including all alternatives, risks, complications.  -At this time given that I'm still able to palpate bone easily I recommended either surgical intervention. I discussed with the surgery as well as the postoperative course. I discussed with her wound excision/debridement with removal of the prominent bone. This weekend through exostectomy and possible sesamoid removal if needed.  -The incision placement as well as the postoperative course was discussed with the patient. I discussed risks of the surgery which include, but not limited to, infection, bleeding, pain, swelling, need for further surgery, delayed or nonhealing, painful or ugly scar, numbness or sensation changes, over/under correction, recurrence, transfer lesions, further  deformity, hardware failure, DVT/PE, loss of toe/foot. Patient understands these risks and wishes to proceed with surgery. The surgical consent was reviewed with the patient all 3 pages were signed. No promises or guarantees were given to the outcome of the procedure. All questions were answered to the best of my ability. Before the surgery the patient was encouraged to call the office if there is any further questions. The surgery will be performed at the [facility] on an outpatient basis. -Continue antibiotics   Celesta Gentile, DPM

## 2016-05-14 ENCOUNTER — Encounter: Payer: Self-pay | Admitting: Podiatry

## 2016-05-14 DIAGNOSIS — L97522 Non-pressure chronic ulcer of other part of left foot with fat layer exposed: Secondary | ICD-10-CM | POA: Diagnosis not present

## 2016-05-14 DIAGNOSIS — E7800 Pure hypercholesterolemia, unspecified: Secondary | ICD-10-CM | POA: Diagnosis not present

## 2016-05-14 DIAGNOSIS — M84879 Other disorders of continuity of bone, unspecified ankle and foot: Secondary | ICD-10-CM | POA: Diagnosis not present

## 2016-05-14 DIAGNOSIS — L97529 Non-pressure chronic ulcer of other part of left foot with unspecified severity: Secondary | ICD-10-CM | POA: Diagnosis not present

## 2016-05-14 DIAGNOSIS — M899 Disorder of bone, unspecified: Secondary | ICD-10-CM | POA: Diagnosis not present

## 2016-05-14 DIAGNOSIS — L97524 Non-pressure chronic ulcer of other part of left foot with necrosis of bone: Secondary | ICD-10-CM | POA: Diagnosis not present

## 2016-05-14 DIAGNOSIS — M86172 Other acute osteomyelitis, left ankle and foot: Secondary | ICD-10-CM | POA: Diagnosis not present

## 2016-05-19 ENCOUNTER — Ambulatory Visit (INDEPENDENT_AMBULATORY_CARE_PROVIDER_SITE_OTHER): Payer: Medicare Other

## 2016-05-19 ENCOUNTER — Ambulatory Visit (INDEPENDENT_AMBULATORY_CARE_PROVIDER_SITE_OTHER): Payer: Self-pay | Admitting: Podiatry

## 2016-05-19 ENCOUNTER — Encounter: Payer: Self-pay | Admitting: Podiatry

## 2016-05-19 VITALS — BP 189/96 | HR 81 | Resp 14

## 2016-05-19 DIAGNOSIS — M84879 Other disorders of continuity of bone, unspecified ankle and foot: Secondary | ICD-10-CM | POA: Diagnosis not present

## 2016-05-19 DIAGNOSIS — M86072 Acute hematogenous osteomyelitis, left ankle and foot: Secondary | ICD-10-CM

## 2016-05-19 DIAGNOSIS — E08621 Diabetes mellitus due to underlying condition with foot ulcer: Secondary | ICD-10-CM

## 2016-05-19 DIAGNOSIS — L97522 Non-pressure chronic ulcer of other part of left foot with fat layer exposed: Secondary | ICD-10-CM

## 2016-05-19 DIAGNOSIS — Z9889 Other specified postprocedural states: Secondary | ICD-10-CM

## 2016-05-20 ENCOUNTER — Other Ambulatory Visit: Payer: Self-pay | Admitting: Internal Medicine

## 2016-05-21 ENCOUNTER — Encounter: Payer: Self-pay | Admitting: Podiatry

## 2016-05-21 ENCOUNTER — Telehealth: Payer: Self-pay | Admitting: *Deleted

## 2016-05-21 NOTE — Progress Notes (Signed)
Subjective: Kasie Leccese is a 71 y.o. is seen today in office s/p left foot I&D, wound excision, bone debridement preformed on 05/14/16. She states that she is not having any pain. She has tried to stay off of her foot as much as possible. Denies any systemic complaints such as fevers, chills, nausea, vomiting. No calf pain, chest pain, shortness of breath.   Objective: General: No acute distress, AAOx3  DP/PT pulses palpable 2/4, CRT < 3 sec to all digits.  Protective sensation intact. Motor function intact.  left foot: Ulceration is presentmeasuring about 1.2 x 1.2 cm and it still does probe to bone There is no pus expressed. There is faint erythema around the wound likely from inflammation as opposed infection. There is no ascending cellulitis.  No other other areas of tenderness to bilateral lower extremities.  No other open lesions or pre-ulcerative lesions.  No pain with calf compression, swelling, warmth, erythema.   Assessment and Plan:  Status post eft foot wound debridement, bone biopsy/debridement, doing well with no complications   -Treatment options discussed including all alternatives, risks, and complications -x-rays were obtained and reviewed. No evidence of acute fracture. 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 -Continue antibiotics. The wound culture was revealing no growth and this was reviewed with her. Waiting pathology. -Saline wet-to-dry dressing changes discussed and she will continue this at home. -Limit weightbearing. Continue surgical shoe at all times. -Monitor for any clinical signs or symptoms of infection and DVT/PE and directed to call the office immediately should any occur or go to the ER. -Follow-up in 1 week or sooner if any problems arise. In the meantime, encouraged to call the office with any questions, concerns, change in symptoms.   Celesta Gentile, DPM

## 2016-05-21 NOTE — Telephone Encounter (Addendum)
-----   Message from [name], DPM sent at 05/21/2016  3:02 PM EST ----- Please let her know that her pathology showed "benign bone" which is good. However continue antibiotics until I see her back. Continue with wet to dry dressing changes and limit activity. Informed pt of Dr. Leigh Aurora orders. Pt asked if she could just clean the foot with water and then dress. I told her yes.

## 2016-05-22 ENCOUNTER — Telehealth: Payer: Self-pay | Admitting: *Deleted

## 2016-05-22 NOTE — Telephone Encounter (Signed)
[name] ordered conforming roll gauze, sterile 4 x 4 gauze, 1" tape, 2 bottles of sterile saline for daily dressing changes for left foot ulcer  E86.072, M86.072 measuring 1.2 x 1.2 x 1.0 cm, from [facility]. Faxed to [facility].

## 2016-05-26 ENCOUNTER — Encounter: Payer: Self-pay | Admitting: Podiatry

## 2016-05-26 ENCOUNTER — Ambulatory Visit (INDEPENDENT_AMBULATORY_CARE_PROVIDER_SITE_OTHER): Payer: Medicare Other | Admitting: Podiatry

## 2016-05-26 VITALS — Temp 98.2°F

## 2016-05-26 DIAGNOSIS — L97522 Non-pressure chronic ulcer of other part of left foot with fat layer exposed: Secondary | ICD-10-CM

## 2016-05-26 DIAGNOSIS — E08621 Diabetes mellitus due to underlying condition with foot ulcer: Secondary | ICD-10-CM | POA: Diagnosis not present

## 2016-05-26 NOTE — Progress Notes (Signed)
Subjective: [name] is a 71 y.o. is seen today in office s/p left foot I&D, wound excision, bone debridement preformed on 05/14/16. She has almost completed her course of antibiotics. She denies any pain. She has continued with saline wet to dry dressing changes daily.  She has been trying to limit her activity. Denies any systemic complaints such as fevers, chills, nausea, vomiting. No calf pain, chest pain, shortness of breath.   Objective: General: No acute distress, AAOx3  DP/PT pulses palpable 2/4, CRT < 3 sec to all digits.  Protective sensation intact. Motor function intact.  Left foot: Ulceration is presentmeasuring about 1 x 0.6 x 0.8 cm. The wound does not probe to bone . There is no surrounding erythema, ascending cellulitis, fluctuance, crepitus, malodor, pus.  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  No other other areas of tenderness to bilateral lower extremities.  No other open lesions or pre-ulcerative lesions.  No pain with calf compression, swelling, warmth, erythema.   Assessment and Plan:  Status post eft foot wound debridement, bone biopsy/debridement, doing well with no complications   -Treatment options discussed including all alternatives, risks, and complications -The wound sharply debrided today without complications. Continue with saline wet to dry dressing changes daily.  -Finish course of antibiotics -Continue with surgical shoe.  -Limit weightbearing. Continue surgical shoe at all times. -Monitor for any clinical signs or symptoms of infection and DVT/PE and directed to call the office immediately should any occur or go to the ER. -Follow-up in 2 weeks or sooner if any problems arise. In the meantime, encouraged to call the office with any questions, concerns, change in symptoms.   Celesta Gentile, DPM

## 2016-06-02 ENCOUNTER — Other Ambulatory Visit: Payer: Self-pay | Admitting: Internal Medicine

## 2016-06-02 ENCOUNTER — Telehealth: Payer: Self-pay | Admitting: Internal Medicine

## 2016-06-02 MED ORDER — GLIMEPIRIDE 4 MG PO TABS: 4.0000 mg | ORAL_TABLET | Freq: Two times a day (BID) | ORAL | 0 refills | Status: DC

## 2016-06-02 MED ORDER — GLIMEPIRIDE 4 MG PO TABS: 4.0000 mg | ORAL_TABLET | Freq: Two times a day (BID) | ORAL | 1 refills | Status: DC

## 2016-06-02 MED ORDER — GLUCOSE BLOOD VI STRP: ORAL_STRIP | 11 refills | Status: DC

## 2016-06-02 NOTE — Telephone Encounter (Signed)
Requesting test stripe renewal to mail order.

## 2016-06-02 NOTE — Telephone Encounter (Signed)
Patient called to advise that Dr Madelon Lips, recently did A1C. I will call to get copy of note and labs.  Patient is running out of Glimepiride 4mg  since [employer]   changed dosing from QD to BID. Please call in 30 day rx for BID to [facility].

## 2016-06-04 NOTE — Progress Notes (Signed)
DOS 02.07.2018 Left foot wound debridement/excision with removal of bone from under the ulcer.

## 2016-06-05 ENCOUNTER — Telehealth: Payer: Self-pay | Admitting: *Deleted

## 2016-06-05 ENCOUNTER — Ambulatory Visit (INDEPENDENT_AMBULATORY_CARE_PROVIDER_SITE_OTHER): Payer: Self-pay | Admitting: Podiatry

## 2016-06-05 ENCOUNTER — Other Ambulatory Visit: Payer: Self-pay | Admitting: *Deleted

## 2016-06-05 VITALS — Temp 99.0°F

## 2016-06-05 DIAGNOSIS — E11621 Type 2 diabetes mellitus with foot ulcer: Secondary | ICD-10-CM

## 2016-06-05 DIAGNOSIS — L03119 Cellulitis of unspecified part of limb: Secondary | ICD-10-CM

## 2016-06-05 DIAGNOSIS — L97524 Non-pressure chronic ulcer of other part of left foot with necrosis of bone: Secondary | ICD-10-CM

## 2016-06-05 DIAGNOSIS — L02619 Cutaneous abscess of unspecified foot: Secondary | ICD-10-CM | POA: Diagnosis not present

## 2016-06-05 MED ORDER — GLUCOSE BLOOD VI STRP: ORAL_STRIP | 11 refills | Status: DC

## 2016-06-05 NOTE — Telephone Encounter (Signed)
Called for a pick up for this patient with [facility] and spoke with Karle Starch and the confirmation number is [id number]. Lattie Haw

## 2016-06-06 NOTE — Progress Notes (Addendum)
Subjective: [name] is a 71 y.o. is seen today in office s/p left foot I&D, wound excision, bone debridement preformed on 05/14/16. She has considered the wound is doing much better. She's remain in the surgical shoe and she is limiting her weightbearing. She is completed her course of antibiotics. She's continue with saline wet-to-dry dressing changes daily. She denies any pus or any redness or swelling. Denies any systemic complaints such as fevers, chills, nausea, vomiting. No calf pain, chest pain, shortness of breath.   Objective: General: No acute distress, AAOx3  DP/PT pulses palpable 2/4, CRT < 3 sec to all digits.  Protective sensation intact. Motor function intact.  Left foot: Ulceration is presentmeasuring about 0.8 x 0.5 x 0.6 cm after debridement. The wound does not probe to bone . There is no surrounding erythema, ascending cellulitis, fluctuance, crepitus, malodor, pus.  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  No other other areas of tenderness to bilateral lower extremities.  No other open lesions or pre-ulcerative lesions.  No pain with calf compression, swelling, warmth, erythema.   Assessment and Plan:  Status post left foot wound debridement, bone biopsy/debridement, doing well with no complications   -Treatment options discussed including all alternatives, risks, and complications -The wound sharply debrided today without complications. We'll switch to Iodosorb dressing changes daily. -I did take a new wound culture today.  -Continue with surgical shoe.  -Limit weightbearing. Continue surgical shoe at all times. -Monitor for any clinical signs or symptoms of infection and DVT/PE and directed to call the office immediately should any occur or go to the ER. -Follow-up in 2 weeks or sooner if any problems arise. In the meantime, encouraged to call the office with any questions, concerns, change in symptoms.   Celesta Gentile, DPM

## 2016-06-08 LAB — WOUND CULTURE
GRAM STAIN: NONE SEEN
Gram Stain: NONE SEEN
Organism ID, Bacteria: NORMAL

## 2016-06-09 NOTE — Telephone Encounter (Addendum)
-----   Message from [name], DPM sent at 06/09/2016  7:08 AM EST ----- Negative on the new culture. Please let her know. I informed pt of Dr. Leigh Aurora review of results. 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 Faxed orders to [facility].

## 2016-06-18 ENCOUNTER — Other Ambulatory Visit: Payer: Self-pay | Admitting: Internal Medicine

## 2016-06-18 ENCOUNTER — Encounter: Payer: Self-pay | Admitting: *Deleted

## 2016-06-18 DIAGNOSIS — E1129 Type 2 diabetes mellitus with other diabetic kidney complication: Secondary | ICD-10-CM | POA: Diagnosis not present

## 2016-06-18 DIAGNOSIS — N184 Chronic kidney disease, stage 4 (severe): Secondary | ICD-10-CM | POA: Diagnosis not present

## 2016-06-18 DIAGNOSIS — L97509 Non-pressure chronic ulcer of other part of unspecified foot with unspecified severity: Secondary | ICD-10-CM | POA: Diagnosis not present

## 2016-06-18 DIAGNOSIS — Z6828 Body mass index (BMI) 28.0-28.9, adult: Secondary | ICD-10-CM | POA: Diagnosis not present

## 2016-06-18 DIAGNOSIS — I129 Hypertensive chronic kidney disease with stage 1 through stage 4 chronic kidney disease, or unspecified chronic kidney disease: Secondary | ICD-10-CM | POA: Diagnosis not present

## 2016-06-18 DIAGNOSIS — E785 Hyperlipidemia, unspecified: Secondary | ICD-10-CM | POA: Diagnosis not present

## 2016-06-18 DIAGNOSIS — E13621 Other specified diabetes mellitus with foot ulcer: Secondary | ICD-10-CM | POA: Diagnosis not present

## 2016-06-19 ENCOUNTER — Encounter: Payer: Self-pay | Admitting: Podiatry

## 2016-06-19 ENCOUNTER — Ambulatory Visit (INDEPENDENT_AMBULATORY_CARE_PROVIDER_SITE_OTHER): Payer: Medicare Other

## 2016-06-19 ENCOUNTER — Ambulatory Visit (INDEPENDENT_AMBULATORY_CARE_PROVIDER_SITE_OTHER): Payer: Medicare Other | Admitting: Podiatry

## 2016-06-19 DIAGNOSIS — E08621 Diabetes mellitus due to underlying condition with foot ulcer: Secondary | ICD-10-CM

## 2016-06-19 DIAGNOSIS — L97522 Non-pressure chronic ulcer of other part of left foot with fat layer exposed: Secondary | ICD-10-CM

## 2016-06-19 DIAGNOSIS — M2011 Hallux valgus (acquired), right foot: Secondary | ICD-10-CM

## 2016-06-19 NOTE — Progress Notes (Signed)
  Subjective: [name] is a 71 y.o. is seen today in office s/p left foot I&D, wound excision, bone debridement preformed on 05/14/16. She's been continuing with the prisma dressing changes daily. She does that she's been on her feet more recently when she has been previously. She denies any drainage or pus or any redness or red streaking.  Denies any systemic complaints such as fevers, chills, nausea, vomiting. No calf pain, chest pain, shortness of breath.   Objective: General: No acute distress, AAOx3  DP/PT pulses palpable 2/4, CRT < 3 sec to all digits.  Protective sensation intact. Motor function intact.  Left foot: Ulceration is presentmeasuring about 0.8 x 0.5 x 0.6 cm after debridement appears been about the same as what was last appointment. The wound does not probe to bone . There is no surrounding erythema, ascending cellulitis, fluctuance, crepitus, malodor, pus.  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  No other other areas of tenderness to bilateral lower extremities.  No other open lesions or pre-ulcerative lesions.  No pain with calf compression, swelling, warmth, erythema.   Assessment and Plan:  Status post left foot wound debridement, bone biopsy/debridement  -Treatment options discussed including all alternatives, risks, and complications -At this point I was hoping of the wound is really doing better. However the toe still continues to sit in a contracted position which is slightly causing the wound from healing. Her extensive tendon appears to be tight causing a retrograde pressure to the first metatarsal plantarly. Discussed with her extensor tenotomy to see if this will help. She wishes to proceed with this and will do this next week. I discussed her alternatives, risks, complications. -For now continue saline wet-to-dry dressing changes. -Hopefully at next appointment for follow-up with [name] as well to see if there  is anything else we can do to offload this wound.   Celesta Gentile, DPM

## 2016-06-25 ENCOUNTER — Ambulatory Visit (INDEPENDENT_AMBULATORY_CARE_PROVIDER_SITE_OTHER): Payer: Medicare Other | Admitting: Podiatry

## 2016-06-25 ENCOUNTER — Encounter: Payer: Self-pay | Admitting: Podiatry

## 2016-06-25 VITALS — BP 164/76 | HR 77 | Resp 18

## 2016-06-25 DIAGNOSIS — E11621 Type 2 diabetes mellitus with foot ulcer: Secondary | ICD-10-CM

## 2016-06-25 DIAGNOSIS — M2450 Contracture, unspecified joint: Secondary | ICD-10-CM

## 2016-06-25 DIAGNOSIS — L97524 Non-pressure chronic ulcer of other part of left foot with necrosis of bone: Secondary | ICD-10-CM

## 2016-06-25 NOTE — Progress Notes (Signed)
  Subjective: [name] is a 71 y.o. is seen today in office s/p left foot I&D, wound excision, bone debridement preformed on 05/14/16. She presents today for extensor tenotomy to help relax the toe with the toe is sitting at dorsal contracted position. By doing an extensor tendon releases will help cause the toe to relax and help take pressure off the plantar medial aspect of submetatarsal one as I believe that part of this is due to retrograde pressure from the toe. She states that she has not had any drainage or pus coming from the wound and she denies any redness or red streaks.  Denies any systemic complaints such as fevers, chills, nausea, vomiting. No calf pain, chest pain, shortness of breath.   Objective: General: No acute distress, AAOx3  DP/PT pulses palpable 2/4, CRT < 3 sec to all digits.  Protective sensation intact. Motor function intact.  Left foot: Ulceration is presentmeasuring about 0.8 x 0.5 x 0.6 cm after debridement. The wound does not probe to bone . There is no surrounding erythema, ascending cellulitis, fluctuance, crepitus, malodor, pus.  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  No other other areas of tenderness to bilateral lower extremities.  No other open lesions or pre-ulcerative lesions.  No pain with calf compression, swelling, warmth, erythema.   Assessment and Plan:  Status post left foot wound debridement, bone biopsy/debridement; presents today for extensor tenotomy  -Treatment options discussed including all alternatives, risks, and complications -Discussed the risks, complications, alternatives. I discussed the extensive tenotomy and hopefully this will help eliminate some of the pressure and call the toe does sit down as it looks that the extensor tendon is contracted. Discussed with her that if this does not help she will likely need for osseous work to help take pressure off the foot. She agrees to this  and surgical consent was signed. Under sterile conditions Lidocaine and Marcaine plain was infiltrated in a Mayo block fashion. Once anesthetized the skin was prepped in sterile fashion. Next an 11 blade scalpel was utilized to make a small incision adjacent to the extensor tendon the extensor tendon was then transected. At this time the toe was sitting any more relaxed position. The incision was irrigated and a single 4-0 Prolene suture was placed. Betadine was being over the incision followed by dry dressing. She can start to change the bandage in 2 days. Also today the wound was sharply debrided without, occasions. There is no drainage or pus expressed. There is no probing to bone, undermining or tunneling. Overall the wound does appear to have some improvement in measures slightly smaller compared to last appointment. -Monitor for any clinical signs or symptoms of infection and directed to call the office immediately should any occur or go to the ER. -RTC 1 week or sooner if needed.   Celesta Gentile, DPM

## 2016-07-01 ENCOUNTER — Encounter: Payer: Self-pay | Admitting: Internal Medicine

## 2016-07-01 ENCOUNTER — Ambulatory Visit (INDEPENDENT_AMBULATORY_CARE_PROVIDER_SITE_OTHER): Payer: Medicare Other | Admitting: Internal Medicine

## 2016-07-01 VITALS — BP 138/80 | HR 76 | Temp 97.3°F | Resp 16 | Ht 65.0 in | Wt 174.8 lb

## 2016-07-01 DIAGNOSIS — I10 Essential (primary) hypertension: Secondary | ICD-10-CM | POA: Diagnosis not present

## 2016-07-01 DIAGNOSIS — I739 Peripheral vascular disease, unspecified: Secondary | ICD-10-CM

## 2016-07-01 DIAGNOSIS — N183 Chronic kidney disease, stage 3 (moderate): Secondary | ICD-10-CM

## 2016-07-01 DIAGNOSIS — E119 Type 2 diabetes mellitus without complications: Secondary | ICD-10-CM | POA: Diagnosis not present

## 2016-07-01 DIAGNOSIS — M109 Gout, unspecified: Secondary | ICD-10-CM | POA: Diagnosis not present

## 2016-07-01 DIAGNOSIS — Z79899 Other long term (current) drug therapy: Secondary | ICD-10-CM | POA: Diagnosis not present

## 2016-07-01 DIAGNOSIS — E782 Mixed hyperlipidemia: Secondary | ICD-10-CM | POA: Diagnosis not present

## 2016-07-01 DIAGNOSIS — Z136 Encounter for screening for cardiovascular disorders: Secondary | ICD-10-CM

## 2016-07-01 DIAGNOSIS — E559 Vitamin D deficiency, unspecified: Secondary | ICD-10-CM | POA: Diagnosis not present

## 2016-07-01 DIAGNOSIS — Z794 Long term (current) use of insulin: Secondary | ICD-10-CM

## 2016-07-01 DIAGNOSIS — I2510 Atherosclerotic heart disease of native coronary artery without angina pectoris: Secondary | ICD-10-CM

## 2016-07-01 DIAGNOSIS — E1122 Type 2 diabetes mellitus with diabetic chronic kidney disease: Secondary | ICD-10-CM

## 2016-07-01 DIAGNOSIS — Z1212 Encounter for screening for malignant neoplasm of rectum: Secondary | ICD-10-CM

## 2016-07-01 LAB — CBC WITH DIFFERENTIAL/PLATELET
BASOS ABS: 0 {cells}/uL (ref 0–200)
Basophils Relative: 0 %
EOS ABS: 448 {cells}/uL (ref 15–500)
Eosinophils Relative: 4 %
HEMATOCRIT: 40.8 % (ref 35.0–45.0)
Hemoglobin: 13.3 g/dL (ref 11.7–15.5)
LYMPHS PCT: 19 %
Lymphs Abs: 2128 cells/uL (ref 850–3900)
MCH: 29.1 pg (ref 27.0–33.0)
MCHC: 32.6 g/dL (ref 32.0–36.0)
MCV: 89.3 fL (ref 80.0–100.0)
MPV: 10.8 fL (ref 7.5–12.5)
Monocytes Absolute: 784 cells/uL (ref 200–950)
Monocytes Relative: 7 %
NEUTROS PCT: 70 %
Neutro Abs: 7840 cells/uL — ABNORMAL HIGH (ref 1500–7800)
Platelets: 222 10*3/uL (ref 140–400)
RBC: 4.57 MIL/uL (ref 3.80–5.10)
RDW: 15.2 % — ABNORMAL HIGH (ref 11.0–15.0)
WBC: 11.2 10*3/uL — ABNORMAL HIGH (ref 3.8–10.8)

## 2016-07-01 NOTE — Patient Instructions (Signed)

## 2016-07-01 NOTE — Progress Notes (Signed)
This very nice 71 y.o. 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 Patient also has OSA on CPAP and reports improved sleep hygiene and less daytime fatigue. Patient has hx/o Gout apparently controlled on Allopurinol. 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      Patient is treated for HTN (1980's) & BP has been controlled at home. Today's BP is near goal - 138/80. Patient underwent CABG in 2014. Patient denies any complaints of any cardiac type chest pain, palpitations, dyspnea/orthopnea/PND, dizziness, claudication or dependent edema.     Hyperlipidemia is controlled with diet & meds. Patient denies myalgias or other med SE's. 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  She was advised to d/c her Metformin due to declining kidney functions and relates her CBG's spiked up until she began limiting pasta in her diet.  She denies symptoms of reactive hypoglycemia, diabetic polys or visual blurring, but does admit intermittent paresthesias of her feet at night w/recumbency.  She has hx/o ray amputation on the Left foot for Osteomyelitis in 2014.  Currently she is followed closely by Dr Colvin Caroli for slow healing pressure ulcer of the left foot. She seems to have little insight into the importance of better diabetic control.  Last A1c was not at goal: Lab Results  Component Value Date   HGBA1C 9.9 (H) 12/17/2015      Further, the patient also has history of Vitamin D Deficiency ("25" in 2008) and was advised to stop Vit D supplements by Dr Milana Kidney. Hollie Salk, her Nephrologist due to elevated Calcium.    Last vitamin D was still very low and not at goal: Lab Results  Component Value Date   VD25OH 32 02/14/2015   Current Outpatient Prescriptions on File Prior to Visit  Medication Sig  . allopurinol (ZYLOPRIM) 100 MG tablet TAKE 1 TABLET (100 MG TOTAL) BY MOUTH 2 (TWO) TIMES DAILY.  Marland Kitchen anastrozole (ARIMIDEX) 1 MG tablet Take 1 mg by mouth daily.  Marland Kitchen aspirin 325 MG tablet Take 325 mg by mouth at bedtime.   . BD PEN NEEDLE NANO U/F 32G X 4 MM MISC USE TO INJECT TWICE DAILY FOR LANTUS INJECTIONS  . carvedilol (COREG) 12.5 MG tablet TAKE 1 TABLET TWICE DAILY FOR BLOOD PRESSURE  . Cholecalciferol 1000 units capsule Take 1,000 Units by mouth daily.  Marland Kitchen glimepiride (AMARYL) 4 MG tablet Take 1 tablet (4 mg total) by mouth 2 (two) times daily.  Marland Kitchen glucose blood test strip Check sugars 3x a day-DX-E10.22  . hydrochlorothiazide (HYDRODIURIL) 12.5 MG tablet TAKE 1 TABLET EVERY DAY  . LANTUS SOLOSTAR 100 UNIT/ML Solostar Pen INJECT  50 UNITS SUBCUTANEOUSLY EVERY DAY  OR AS DIRECTED (DISCARD AND BEGIN NEW PEN EVERY 28 DAYS)  . Misc. Devices MISC Custom molded inserts for diabetic shoes - ICD10: E11.41. Detailed foot exam performed at the time of the visit on 02/17/2014. Patient has neuropathy, multiple digit amputations and calluses.  . Polyvinyl Alcohol-Povidone (REFRESH OP) Place 1 drop into both eyes daily as needed (  dry eyes).  . rosuvastatin (CRESTOR) 20 MG tablet TAKE 1 TABLET EVERY DAY  . valsartan (DIOVAN) 320 MG tablet TAKE 1 TABLET EVERY MORNING FOR BLOOD PRESSURE  . HYDROcodone-acetaminophen (NORCO/VICODIN) 5-325 MG tablet Take 1-2 tablets by mouth every 4 (four) hours as needed for moderate pain.   No current facility-administered medications on file prior to visit.    Allergies  Allergen Reactions  . Atenolol Other (See Comments)    Exacerbates gout  . Contrast Media [Iodinated Diagnostic Agents] Rash  . Latex Rash  . Omnipaque [Iohexol] Rash   PMHx:   Past Medical History:  Diagnosis  Date  . Allergy   . Arthritis    "maybe in my lower back" (08/27/2012)  . 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 CKD (chronic kidney disease), stage I   . Coronary artery disease    NSTEMI in 05/2009;  LHC with 2 v CAD - > CABG (LIMA-LAD, SVG-diagonal, SVG-OM1/OM 2).   . Family history of anesthesia complication    Mom has a hard time to wake up  . Foot ulcer ([place])    "I've had them on both feet" (08/27/2012)  . Gouty arthritis    "right index finger" (08/27/2012)  . History of radiation therapy 06/09/13-07/06/13   left breast 50Gy  . Hyperlipidemia   . Hypertension   . Ischemic cardiomyopathy    Echo 07/2009: Mild LVH, EF 40-45%, inferior and posterior HK, grade 1 diastolic dysfunction, moderate MAC, mild to moderate MR, mild LAE  . Neuropathy (HCC)    Hx; of B/L feet  . NSTEMI (non-ST elevated myocardial infarction) ([place]) 05/23/2009   Archie Endo 05/23/2009 (08/27/2012)  . Osteomyelitis of foot ([place])   . PAD (peripheral artery disease) ([place])   . Sinus headache    "occasionally" (08/27/2012)  . Type II diabetes mellitus ([place])   . Type II diabetes mellitus with neurological manifestations, uncontrolled ([place]) 02/17/2014  . Type II or unspecified type diabetes mellitus without mention of complication, not stated as uncontrolled   . Vitamin D deficiency    Immunization History  Administered Date(s) Administered  . Influenza, High Dose Seasonal PF 01/30/2014, 02/14/2015, 12/17/2015  . Influenza-Unspecified 02/05/2013  . Pneumococcal Conjugate-13 01/30/2014  . Pneumococcal Polysaccharide-23 01/07/2011  . Td 01/13/2012  . Zoster 01/13/2012   Past Surgical History:  Procedure Laterality Date  . AMPUTATION Right 08/27/2012   Procedure: AMPUTATION RAY;  Surgeon: Newt Minion, MD;  Location: [facility];  Service: Orthopedics;  Laterality: Right;  Right Foot 5th Ray Amputation  . AMPUTATION Left 07/08/2013   Procedure: AMPUTATION RAY;  Surgeon: Newt Minion, MD;  Location: [facility];  Service: Orthopedics;  Laterality: Left;  Left Foot 2nd Ray Amputation  . AMPUTATION RAY Right 08/27/2012   5th ray/notes 08/27/2012  . BREAST LUMPECTOMY  04/20/2013   with biopsy      DR WAKEFIELD  . BREAST LUMPECTOMY WITH NEEDLE LOCALIZATION AND AXILLARY SENTINEL LYMPH NODE BX Left 04/20/2013   Procedure: LEFT BREAST WIRE GUIDED LUMPECTOMY AND AXILLARY SENTINEL LYMPH NODE BX;  Surgeon: Rolm Bookbinder, MD;  Location: [facility];  Service: General;  Laterality: Left;  . CARDIAC CATHETERIZATION  05/24/2009   Archie Endo 05/24/2009 (08/27/2012)  . CATARACT EXTRACTION W/ INTRAOCULAR LENS  IMPLANT, BILATERAL  2000's  . COLONOSCOPY W/ BIOPSIES AND POLYPECTOMY  2010  . CORONARY ARTERY BYPASS GRAFT  2011   "CABG X4" (08/27/2012)  . DENTAL SURGERY  04/30/11   "1 implant" (08/27/2012)  .  DILATION AND CURETTAGE OF UTERUS    . EYE SURGERY    . FINGER SURGERY Right    "index finger; turned out to be gout" (08/27/2012)   FHx:    Reviewed / unchanged  SHx:    Reviewed / unchanged  Systems Review:  Constitutional: Denies fever, chills, wt changes, headaches, insomnia, fatigue, night sweats, change in appetite. Eyes: Denies redness, blurred vision, diplopia, discharge, itchy, watery eyes.  ENT: Denies discharge, congestion, post nasal drip, epistaxis, sore throat, earache, hearing loss, dental pain, tinnitus, vertigo, sinus pain, snoring.  CV: Denies chest pain, palpitations, irregular heartbeat, syncope, dyspnea, diaphoresis, orthopnea, PND, claudication or edema. Respiratory: denies cough, dyspnea, DOE, pleurisy, hoarseness, laryngitis, wheezing.  Gastrointestinal: Denies dysphagia, odynophagia, heartburn, reflux, water brash, abdominal pain or cramps, nausea, vomiting, bloating, diarrhea, constipation, hematemesis, melena, hematochezia  or hemorrhoids. Genitourinary: Denies dysuria, frequency, urgency, nocturia, hesitancy, discharge, hematuria or flank pain. Musculoskeletal: Denies  arthralgias, myalgias, stiffness, jt. swelling, pain, limping or strain/sprain.  Skin: Denies pruritus, rash, hives, warts, acne, eczema or change in skin lesion(s). Neuro: No weakness, tremor, incoordination, spasms, paresthesia or pain. Psychiatric: Denies confusion, memory loss or sensory loss. Endo: Denies change in weight, skin or hair change.  Heme/Lymph: No excessive bleeding, bruising or enlarged lymph nodes.  Physical Exam  BP 138/80   Pulse 76   Temp 97.3 F (36.3 C)   Resp 16   Ht 5' 5"  (1.651 m)   Wt 174 lb 12.8 oz (79.3 kg)   BMI 29.09 kg/m   Appears over nourished and in no distress.  Eyes: PERRLA, EOMs, conjunctiva no swelling or erythema. Sinuses: No frontal/maxillary tenderness ENT/Mouth: EAC's clear, TM's nl w/o erythema, bulging. Nares clear w/o erythema, swelling, exudates. Oropharynx clear without erythema or exudates. Oral hygiene is good. Tongue normal, non obstructing. Hearing intact.  Neck: Supple. Thyroid nl. Car 2+/2+ without bruits, nodes or JVD. Chest: Median sternotomy scar. Respirations nl with BS clear & equal w/o rales, rhonchi, wheezing or stridor.  Cor: Heart sounds normal w/ regular rate and rhythm without sig. murmurs, gallops, clicks, or rubs. Peripheral pulses =1 equivocal bilat with 1 (+) pretibial edema.  Abdomen: Soft & bowel sounds normal. Non-tender w/o guarding, rebound, hernias, masses, or organomegaly.  Lymphatics: Unremarkable.  Musculoskeletal:  Generalized decrease in muscle power, tone & bulk and in a W/C today with a left cast shoe on. .  Skin: Warm, dry without exposed rashes, lesions or ecchymosis apparent.  Neuro: Cranial nerves intact, reflexes equal bilaterally. Sensory testing with decreased touch, vibratory and Monofilament in a stocking distribution to the prox shin(s) bilaterally. . Tendon reflexes flat thru-out .  Pysch: Alert & oriented x 3.  Insight and judgement nl & appropriate. No ideations.  Assessment and  Plan:  1. Essential hypertension  - Continue medication, monitor blood pressure at home.  - Continue DASH diet. Reminder to go to the ER if any CP,  SOB, nausea, dizziness, severe HA, changes vision/speech,  left arm numbness and tingling and jaw pain.  - CBC with Differential/Platelet - BASIC METABOLIC PANEL WITH GFR - Magnesium - TSH - Microalbumin / creatinine urine ratio - EKG 12-Lead - Urinalysis, Routine w reflex microscopic  2. Hyperlipidemia  - Continue diet/meds, exercise,& lifestyle modifications.  - Continue monitor periodic cholesterol/liver & renal functions   - Hepatic function panel - Lipid panel - TSH - EKG 12-Lead  3. Diabetes mellitus type 2, insulin dependent (HCC)  - Advised d/c Glimepiride and Lantus and  begin Novolin 70/30  mix at dose 35 units w/Bkfst and 15 units w/ supper and  Monitor bid CBG's and ROV 2 weeks to review or call sooner if questions.  - Hemoglobin A1c - HM DIABETES FOOT EXAM - LOW EXTREMITY NEUR EXAM DOCUM - Microalbumin / creatinine urine ratio - EKG 12-Lead  4. Vitamin D deficiency  - Continue diet, exercise, lifestyle modifications.  - Monitor appropriate labs. - Continue supplementation. - VITAMIN D 25 Hydroxy   5. CKD stage 3 due to type 2 diabetes mellitus ([place])   6. Peripheral vascular disease ([place])   7. Gouty arthritis  - Uric acid  8. Atherosclerosis of native coronary artery of native heart without angina pectoris  - EKG 12-Lead  9. Screening for ischemic heart disease  - EKG 12-Lead  10. Screening for rectal cancer  - POC Hemoccult Bld/Stl   11. Medication management  - CBC with Differential/Platelet - BASIC METABOLIC PANEL WITH GFR - Hepatic function panel - Magnesium - Lipid panel - TSH - Hemoglobin A1c - VITAMIN D 25 Hydroxy  - Uric acid - Urinalysis, Routine w reflex microscopic        Recommended regular exercise, BP monitoring, weight control, and discussed med and SE's.  Recommended labs to assess and monitor clinical status. Further disposition pending results of labs. Over 45 minutes of exam, counseling, chart review was performed

## 2016-07-02 ENCOUNTER — Other Ambulatory Visit: Payer: Self-pay | Admitting: Internal Medicine

## 2016-07-02 ENCOUNTER — Encounter: Payer: Self-pay | Admitting: *Deleted

## 2016-07-02 LAB — URINALYSIS, ROUTINE W REFLEX MICROSCOPIC
Bilirubin Urine: NEGATIVE
KETONES UR: NEGATIVE
LEUKOCYTES UA: NEGATIVE
NITRITE: NEGATIVE
PH: 5.5 (ref 5.0–8.0)
Specific Gravity, Urine: 1.011 (ref 1.001–1.035)

## 2016-07-02 LAB — LIPID PANEL
CHOL/HDL RATIO: 5.4 ratio — AB (ref <5.0)
CHOLESTEROL: 130 mg/dL (ref <200)
HDL: 24 mg/dL — ABNORMAL LOW (ref >50)
LDL CALC: 28 mg/dL (ref <100)
Triglycerides: 391 mg/dL — ABNORMAL HIGH (ref <150)
VLDL: 78 mg/dL — AB (ref <30)

## 2016-07-02 LAB — BASIC METABOLIC PANEL WITH GFR
BUN: 74 mg/dL — ABNORMAL HIGH (ref 7–25)
CHLORIDE: 98 mmol/L (ref 98–110)
CO2: 22 mmol/L (ref 20–31)
Calcium: 9.8 mg/dL (ref 8.6–10.4)
Creat: 2.81 mg/dL — ABNORMAL HIGH (ref 0.60–0.93)
GFR, EST NON AFRICAN AMERICAN: 16 mL/min — AB (ref >=60)
GFR, Est African American: 19 mL/min — ABNORMAL LOW (ref >=60)
Glucose, Bld: 201 mg/dL — ABNORMAL HIGH (ref 65–99)
POTASSIUM: 5.2 mmol/L (ref 3.5–5.3)
Sodium: 134 mmol/L — ABNORMAL LOW (ref 135–146)

## 2016-07-02 LAB — HEMOGLOBIN A1C
HEMOGLOBIN A1C: 9.9 % — AB (ref <5.7)
Mean Plasma Glucose: 237 mg/dL

## 2016-07-02 LAB — HEPATIC FUNCTION PANEL
ALK PHOS: 82 U/L (ref 33–130)
ALT: 12 U/L (ref 6–29)
AST: 14 U/L (ref 10–35)
Albumin: 4 g/dL (ref 3.6–5.1)
BILIRUBIN INDIRECT: 0.2 mg/dL (ref 0.2–1.2)
Bilirubin, Direct: 0.1 mg/dL (ref <=0.2)
Total Bilirubin: 0.3 mg/dL (ref 0.2–1.2)
Total Protein: 7 g/dL (ref 6.1–8.1)

## 2016-07-02 LAB — MICROALBUMIN / CREATININE URINE RATIO
CREATININE, URINE: 38 mg/dL (ref 20–320)
MICROALB UR: 78.3 mg/dL
MICROALB/CREAT RATIO: 2061 ug/mg{creat} — AB (ref <30)

## 2016-07-02 LAB — URINALYSIS, MICROSCOPIC ONLY
Bacteria, UA: NONE SEEN [HPF]
Casts: NONE SEEN [LPF]
Crystals: NONE SEEN [HPF]
RBC / HPF: NONE SEEN RBC/HPF (ref <=2)
SQUAMOUS EPITHELIAL / LPF: NONE SEEN [HPF] (ref <=5)
WBC UA: NONE SEEN WBC/HPF (ref <=5)
Yeast: NONE SEEN [HPF]

## 2016-07-02 LAB — URIC ACID: Uric Acid, Serum: 5.3 mg/dL (ref 2.5–7.0)

## 2016-07-02 LAB — MAGNESIUM: Magnesium: 2.2 mg/dL (ref 1.5–2.5)

## 2016-07-02 LAB — VITAMIN D 25 HYDROXY (VIT D DEFICIENCY, FRACTURES): Vit D, 25-Hydroxy: 34 ng/mL (ref 30–100)

## 2016-07-02 LAB — TSH: TSH: 2.06 m[IU]/L

## 2016-07-03 ENCOUNTER — Ambulatory Visit: Payer: Medicare Other | Admitting: Podiatry

## 2016-07-07 ENCOUNTER — Ambulatory Visit (INDEPENDENT_AMBULATORY_CARE_PROVIDER_SITE_OTHER): Payer: Medicare Other | Admitting: Podiatry

## 2016-07-07 DIAGNOSIS — L97522 Non-pressure chronic ulcer of other part of left foot with fat layer exposed: Secondary | ICD-10-CM

## 2016-07-07 DIAGNOSIS — E08621 Diabetes mellitus due to underlying condition with foot ulcer: Secondary | ICD-10-CM

## 2016-07-07 DIAGNOSIS — M2450 Contracture, unspecified joint: Secondary | ICD-10-CM

## 2016-07-09 NOTE — Progress Notes (Signed)
  Subjective: [name] is a 71 y.o. is seen today in office s/p left foot I&D, wound excision, bone debridement preformed on 05/14/16. She also recently just underwent extensor tenotomy. She states that she is happy so far with the result she is however that her toe was planed flat. She states the wound is also looking the best and is lifted along time. She has continue with the silver collagen dressing changes daily. She denies any drainage or pus and denies any redness or warmth. She has remained in the surgical shoe but she has started become more active. She denies any pain. Denies any systemic complaints such as fevers, chills, nausea, vomiting. No calf pain, chest pain, shortness of breath.   Objective: General: No acute distress, AAOx3  DP/PT pulses palpable 2/4, CRT < 3 sec to all digits.  Protective sensation intact. Motor function intact.  Left foot: Ulceration is presentmeasuring about 0.6 x 0.3 x 0.3 cm after debridement. There is no probing to bone, undermining or tunneling. There is no swelling erythema, ascending cellulitis, fluctuance, crepitus, malodor, purulence. Overall the wound does appear to be improved compared to last appointment. The hallux does sit in a more rectus position although there is still a bunion present. There is a previous second toe amputation present.  No other other areas of tenderness to bilateral lower extremities.  No other open lesions or pre-ulcerative lesions.  No pain with calf compression, swelling, warmth, erythema.   Assessment and Plan:  Status post left foot wound debridement, bone biopsy/debridement; extensor tenotomy  -Treatment options discussed including all alternatives, risks, and complications -Wound sharply debrided to without complications or bleeding. Overall the wound has improved. I recommended her to continue surgical shoe with limited activities. Continue with collagen dressing changes daily. -Monitor for any clinical signs or  symptoms of infection and directed to call the office immediately should any occur or go to the ER. -RTC as scheduled or sooner if needed.   Celesta Gentile, DPM

## 2016-07-14 NOTE — Progress Notes (Signed)
Subjective:    Patient ID: [name], female    DOB: December 14, 1945, 71 y.o.   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 Patient was reiterated importance of better diet and she seems indifferent or lacks insight of the importance of better diabetic control wrt her declinng kidney functions. Patient glucoses are still running in the 200-300's .   Medication Sig  . allopurinol  100 MG tablet TAKE 1 TABLET (100 MG TOTAL) BY MOUTH 2 (TWO) TIMES DAILY.  Marland Kitchen anastrozole1 MG tablet Take 1 mg by mouth daily.  Marland Kitchen aspirin 325 MG tablet Take 325 mg by mouth at bedtime.   . carvedilol  12.5 MG tablet TAKE 1 TABLET TWICE DAILY FOR BLOOD PRESSURE  . Cholecalciferol 1000 units  Take 1,000 Units by mouth daily.  . hctz 12.5 MG tablet TAKE 1 TABLET EVERY DAY  . NORCO 5-325 MG Take 1-2 tablets by mouth every 4 (four) hours as needed  . REFRESH OP Place 1 drop into both eyes daily as needed (dry eyes).  . rosuvastatin  20 MG tablet TAKE 1 TABLET EVERY DAY  . valsartan  320 MG tablet TAKE 1 TABLET EVERY MORNING FOR BLOOD PRESSURE   Allergies  Allergen Reactions  . Atenolol Other (See Comments)    Exacerbates gout  . Contrast Media [Iodinated Diagnostic Agents] Rash  . Latex Rash  . Omnipaque [Iohexol] Rash   Past Surgical History:  Procedure Laterality Date  . AMPUTATION Right 08/27/2012   Procedure: AMPUTATION RAY;  Surgeon: Newt Minion, MD;  Location: [facility];  Service: Orthopedics;  Laterality: Right;  Right Foot 5th Ray Amputation  . AMPUTATION Left 07/08/2013   Procedure: AMPUTATION RAY;  Surgeon: Newt Minion, MD;  Location: [facility];  Service: Orthopedics;  Laterality: Left;  Left Foot 2nd Ray Amputation  . AMPUTATION  RAY Right 08/27/2012   5th ray/notes 08/27/2012  . BREAST LUMPECTOMY  04/20/2013   with biopsy      DR WAKEFIELD  . BREAST LUMPECTOMY WITH NEEDLE LOCALIZATION AND AXILLARY SENTINEL LYMPH NODE BX Left 04/20/2013   Procedure: LEFT BREAST WIRE GUIDED LUMPECTOMY AND AXILLARY SENTINEL LYMPH NODE BX;  Surgeon: Rolm Bookbinder, MD;  Location: [facility];  Service: General;  Laterality: Left;  . CARDIAC CATHETERIZATION  05/24/2009   Archie Endo 05/24/2009 (08/27/2012)  . CATARACT EXTRACTION W/ INTRAOCULAR LENS  IMPLANT, BILATERAL  2000's  . COLONOSCOPY W/ BIOPSIES AND POLYPECTOMY  2010  . CORONARY ARTERY BYPASS GRAFT  2011   "CABG X4" (08/27/2012)  . DENTAL SURGERY  04/30/11   "1 implant" (08/27/2012)  . DILATION AND CURETTAGE OF UTERUS    . EYE SURGERY    . FINGER SURGERY Right    "index finger; turned out to be gout" (08/27/2012)   Review of Systems    10 point systems review negative except as above.    Objective:   Physical Exam  BP 130/80   Pulse 64   Temp 97.5 F (36.4 C)   Resp 16   Ht 5\' 5"  (1.651 m)   Wt 172 lb 6.4 oz (78.2 kg)   BMI 28.69 kg/m   HEENT - Eac's patent. TM's Nl. EOM's full. PERRLA. NasoOroPharynx clear. Neck -  supple. Nl Thyroid. Carotids 2+ & No bruits, nodes, JVD Chest - Clear equal BS w/o Rales, rhonchi, wheezes. Cor - Nl HS. RRR w/o sig MGR. PP 1(+). No edema. Abd - No palpable organomegaly, masses or tenderness. BS nl. MS- FROM w/o deformities. Muscle power, tone and bulk Nl. Gait Nl. Neuro - No obvious Cr N abnormalities. Sensory, motor and Cerebellar functions appear Nl w/o focal abnormalities. Psyche - Mental status normal & appropriate.  No delusions, ideations or obvious mood abnormalities.    Assessment & Plan:   1. Essential hypertension   2. 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   - Recommend increasing Novolin 70/30 mix to 40 u qam and 20 u qpm   - ROV - 1 month       .

## 2016-07-15 ENCOUNTER — Encounter: Payer: Self-pay | Admitting: Internal Medicine

## 2016-07-15 ENCOUNTER — Ambulatory Visit (INDEPENDENT_AMBULATORY_CARE_PROVIDER_SITE_OTHER): Payer: Medicare Other | Admitting: Internal Medicine

## 2016-07-15 VITALS — BP 130/80 | HR 64 | Temp 97.5°F | Resp 16 | Ht 65.0 in | Wt 172.4 lb

## 2016-07-15 DIAGNOSIS — N183 Chronic kidney disease, stage 3 unspecified: Secondary | ICD-10-CM

## 2016-07-15 DIAGNOSIS — I10 Essential (primary) hypertension: Secondary | ICD-10-CM

## 2016-07-15 DIAGNOSIS — I2510 Atherosclerotic heart disease of native coronary artery without angina pectoris: Secondary | ICD-10-CM | POA: Diagnosis not present

## 2016-07-15 DIAGNOSIS — E1122 Type 2 diabetes mellitus with diabetic chronic kidney disease: Secondary | ICD-10-CM

## 2016-07-15 NOTE — Patient Instructions (Addendum)
Increase Novolin 70/30 mix to   40 units - Breakfast   and  20 units at supper

## 2016-07-21 ENCOUNTER — Ambulatory Visit (INDEPENDENT_AMBULATORY_CARE_PROVIDER_SITE_OTHER): Payer: Medicare Other | Admitting: Podiatry

## 2016-07-21 ENCOUNTER — Encounter: Payer: Self-pay | Admitting: Podiatry

## 2016-07-21 DIAGNOSIS — L97522 Non-pressure chronic ulcer of other part of left foot with fat layer exposed: Secondary | ICD-10-CM

## 2016-07-21 DIAGNOSIS — E08621 Diabetes mellitus due to underlying condition with foot ulcer: Secondary | ICD-10-CM | POA: Diagnosis not present

## 2016-07-22 NOTE — Progress Notes (Signed)
  Subjective: [name] is a 71 y.o. is seen today in office s/p left foot I&D, wound excision, bone debridement preformed on 05/14/16. She also recently just underwent extensor tenotomy. She states that the wound is doing well. She has been applying the silver/collagen dressing with antibiotic ointment daily. She has remained in the surgical shoe but does admit to becoming more active and going back to her regular routine. She denies any drainage or pus and denies any redness or warmth. SShe denies any pain. Denies any systemic complaints such as fevers, chills, nausea, vomiting. No calf pain, chest pain, shortness of breath.   Objective: General: No acute distress, AAOx3  DP/PT pulses palpable 2/4, CRT < 3 sec to all digits.  Protective sensation intact. Motor function intact.  Left foot: Ulceration is presentmeasuring about 0.4 x 0.3 x 0.3 cm after debridement. There is no probing to bone, undermining or tunneling. There is no surrounding erythema, ascending cellulitis, fluctuance, crepitus, malodor, purulence. Overall the wound does continue to improve compared to last appointment. The hallux also sits in a more rectus position although there is still a bunion present. There is a previous second toe amputation present.  No other other areas of tenderness to bilateral lower extremities.  No other open lesions or pre-ulcerative lesions.  No pain with calf compression, swelling, warmth, erythema.   Assessment and Plan:  Status post left foot wound debridement, bone biopsy/debridement; extensor tenotomy  -Treatment options discussed including all alternatives, risks, and complications -Medially necessary wound sharply debrided to without complications or bleeding to remove hyperkertotic and nonviable tissue to granular wound bed. Overall the wound has improved. I recommended her to continue surgical shoe with limited activities. Continue with collagen dressing changes daily. -Monitor for any  clinical signs or symptoms of infection and directed to call the office immediately should any occur or go to the ER. -RTC as scheduled or sooner if needed.   Celesta Gentile, DPM

## 2016-08-01 ENCOUNTER — Other Ambulatory Visit: Payer: Self-pay | Admitting: Internal Medicine

## 2016-08-04 ENCOUNTER — Ambulatory Visit (INDEPENDENT_AMBULATORY_CARE_PROVIDER_SITE_OTHER): Payer: Self-pay | Admitting: Podiatry

## 2016-08-04 ENCOUNTER — Ambulatory Visit (INDEPENDENT_AMBULATORY_CARE_PROVIDER_SITE_OTHER): Payer: Medicare Other

## 2016-08-04 DIAGNOSIS — L97522 Non-pressure chronic ulcer of other part of left foot with fat layer exposed: Secondary | ICD-10-CM | POA: Diagnosis not present

## 2016-08-04 DIAGNOSIS — L97521 Non-pressure chronic ulcer of other part of left foot limited to breakdown of skin: Secondary | ICD-10-CM

## 2016-08-04 DIAGNOSIS — E08621 Diabetes mellitus due to underlying condition with foot ulcer: Secondary | ICD-10-CM

## 2016-08-06 NOTE — Progress Notes (Signed)
  Subjective: [name] is a 71 y.o. is seen today in office s/p left foot I&D, wound excision, bone debridement preformed on 05/14/16. She states that she is doing much better. She believes that the wound is doing well and is healed. Denies any drainage or pus. No swelling or redness. She is happy thus far as she has started to become more active, with the surgical shoe in place.  Denies any systemic complaints such as fevers, chills, nausea, vomiting. No calf pain, chest pain, shortness of breath.   Objective: General: No acute distress, AAOx3  DP/PT pulses palpable 2/4, CRT < 3 sec to all digits.  Protective sensation intact. Motor function intact.  Left foot: hyperkeratotic tissue is present over the area of ulcer. After debridement there is a pinpont size opening. There is no probing. No drainage or pus is expressed. No swelling, pain. No surrounding erythema or ascending cellulitis. No malodor. The wound is much improved. No other other open lesions or pre-ulcerative lesions.  No pain with calf compression, swelling, warmth, erythema.   Assessment and Plan:  Status post left foot wound debridement, bone biopsy/debridement; extensor tenotomy  -Treatment options discussed including all alternatives, risks, and complications -I sharply debrided the hyperkeratotic tissue over the ulcer. Recommended a small amount of antibiotic ointment and a bandage daily. Monitor for any clinical signs or symptoms of infection and directed to call the office immediately should any occur or go to the ER. -Continue surgical shoe with offloading -Will have her see [name] for diabetic shoe molding. She has had them previously and not been happy so I will have her see him. Also asked her to bring in her old pairs so we can see what she didn't like about them.   Celesta Gentile, DPM

## 2016-08-10 ENCOUNTER — Other Ambulatory Visit: Payer: Self-pay | Admitting: Oncology

## 2016-08-20 ENCOUNTER — Ambulatory Visit (INDEPENDENT_AMBULATORY_CARE_PROVIDER_SITE_OTHER): Payer: Medicare Other | Admitting: Internal Medicine

## 2016-08-20 ENCOUNTER — Encounter: Payer: Self-pay | Admitting: Internal Medicine

## 2016-08-20 VITALS — BP 142/72 | HR 76 | Temp 97.4°F | Resp 16 | Ht 65.0 in | Wt 176.0 lb

## 2016-08-20 DIAGNOSIS — I2510 Atherosclerotic heart disease of native coronary artery without angina pectoris: Secondary | ICD-10-CM

## 2016-08-20 DIAGNOSIS — Z794 Long term (current) use of insulin: Secondary | ICD-10-CM

## 2016-08-20 DIAGNOSIS — E119 Type 2 diabetes mellitus without complications: Secondary | ICD-10-CM

## 2016-08-20 NOTE — Progress Notes (Signed)
Subjective:    Patient ID: [name], female    DOB: Mar 04, 1946, 71 y.o.   MRN: [id number]  HPI  This nice 71 yo MWF with poorly controlled insulin dependentg T2_DM returns for 1 month f/u after recently d/c'ing Glimepiride & Lantus, transitioning to bid dosing with Novolin 70/30 mix for cost conservation and in hopes of improving control. Patient was increased her bid dosing from 35 u qam / 15 u qpm up to 40-50 units  Bid with CBG's still running  Occasionally up in the 200's. She has not consistently monitored CBG's bid as requested and she seems inconsistent having little insight  in interpreting and administering her insulin dosing. She denies any diabetic polys, paresthesias, visual blurring or hypoglycemic reactions. She reviewed a food diary which I question her accuracy in documenting all of her food consumed.   Medication Sig  . allopurinol  100 MG tablet TAKE 1 TABLET TWICE DAILY  . anastrozole  1 MG tablet TAKE 1 TABLET EVERY DAY  . aspirin 325 MG tablet Take 325 mg by mouth at bedtime.   . carvedilol 12.5 MG tablet TAKE 1 TABLET TWICE DAILY FOR BLOOD PRESSURE  . hctz 12.5 MG tablet TAKE 1 TABLET EVERY DAY  . iNOVOLIN 70/30 injection Inject into the skin. Injecting 50 units in the AM and 50 units in the PM.  . REFRESH  Place 1 drop into both eyes daily as needed (dry eyes).  . rosuvastatin  20 MG tablet TAKE 1 TABLET EVERY DAY  . valsartan 320 MG tablet TAKE 1 TABLET EVERY MORNING FOR BLOOD PRESSURE   Allergies  Allergen Reactions  . Atenolol Other (See Comments)    Exacerbates gout  . Contrast Media [Iodinated Diagnostic Agents] Rash  . Latex Rash  . Omnipaque [Iohexol] Rash   Past Medical History:  Diagnosis Date  . Allergy   . Arthritis    "maybe in my lower back" (08/27/2012)  . 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 CKD (chronic kidney disease), stage I   . Coronary artery disease    NSTEMI in 05/2009;  LHC with 2 v  CAD - > CABG (LIMA-LAD, SVG-diagonal, SVG-OM1/OM 2).   . Family history of anesthesia complication    Mom has a hard time to wake up  . Foot ulcer ([place])    "I've had them on both feet" (08/27/2012)  . Gouty arthritis    "right index finger" (08/27/2012)  . History of radiation therapy 06/09/13-07/06/13   left breast 50Gy  . Hyperlipidemia   . Hypertension   . Ischemic cardiomyopathy    Echo 07/2009: Mild LVH, EF 40-45%, inferior and posterior HK, grade 1 diastolic dysfunction, moderate MAC, mild to moderate MR, mild LAE  . Neuropathy    Hx; of B/L feet  . NSTEMI (non-ST elevated myocardial infarction) ([facility]) 05/23/2009   Archie Endo 05/23/2009 (08/27/2012)  . Osteomyelitis of foot ([facility])   . PAD (peripheral artery disease) ([place] [facility])   . Sinus headache    "occasionally" (08/27/2012)  . Type II diabetes mellitus ([facility])   . Type II diabetes mellitus with neurological manifestations, uncontrolled ([place]) 02/17/2014  . Type II or unspecified type diabetes mellitus without mention of complication, not stated as uncontrolled   . Vitamin D deficiency    Past Surgical History:  Procedure Laterality Date  . AMPUTATION Right 08/27/2012   Procedure: AMPUTATION RAY;  Surgeon: Newt Minion, MD;  Location: [facility];  Service: Orthopedics;  Laterality: Right;  Right Foot 5th Ray Amputation  . AMPUTATION Left 07/08/2013   Procedure: AMPUTATION RAY;  Surgeon: Newt Minion, MD;  Location: [facility];  Service: Orthopedics;  Laterality: Left;  Left Foot 2nd Ray Amputation  . AMPUTATION RAY Right 08/27/2012   5th ray/notes 08/27/2012  . BREAST LUMPECTOMY  04/20/2013   with biopsy      DR WAKEFIELD  . BREAST LUMPECTOMY WITH NEEDLE LOCALIZATION AND AXILLARY SENTINEL LYMPH NODE BX Left 04/20/2013   Procedure: LEFT BREAST WIRE GUIDED LUMPECTOMY AND AXILLARY SENTINEL LYMPH NODE BX;  Surgeon: Rolm Bookbinder, MD;  Location: [facility];  Service: General;  Laterality: Left;  . CARDIAC CATHETERIZATION  05/24/2009   Archie Endo 05/24/2009 (08/27/2012)    . CATARACT EXTRACTION W/ INTRAOCULAR LENS  IMPLANT, BILATERAL  2000's  . COLONOSCOPY W/ BIOPSIES AND POLYPECTOMY  2010  . CORONARY ARTERY BYPASS GRAFT  2011   "CABG X4" (08/27/2012)  . DENTAL SURGERY  04/30/11   "1 implant" (08/27/2012)  . DILATION AND CURETTAGE OF UTERUS    . EYE SURGERY    . FINGER SURGERY Right    "index finger; turned out to be gout" (08/27/2012)   Review of Systems  10 point systems review negative except as above.    Objective:   Physical Exam  BP (!) 142/72   Pulse 76   Temp 97.4 F (36.3 C)   Resp 16   Ht _0  (1.651 m)   Wt 176 lb (79.8 kg)   BMI 29.29 kg/m   HEENT - WNL. Neck - supple.  Chest - Clear equal BS. Cor - Nl HS. RRR w/o sig MGR. PP 1(+). No edema. MS- FROM w/o deformities.  Gait Nl. Neuro -  Nl w/o focal abnormalities.    Assessment & Plan:   1. Diabetes mellitus type 2, insulin dependent (HCC)  - Approx 20 min was spent with patient detailing how to calculate dosing adjustment relative to CBG's.   - Reiterated importance of better diet.

## 2016-08-20 NOTE — Patient Instructions (Signed)
   Recommend the book "The END of DIETING" by Dr Joel Fuhrman   & the book "The END of DIABETES " by Dr Joel Fuhrman  At [employer] - get book & Audio CD's      Being diabetic has a  300% increased risk for heart attack, stroke, cancer, and alzheimer- type vascular dementia. It is very important that you work harder with diet by avoiding all foods that are white. Avoid white rice (brown & wild rice is OK), white potatoes (sweetpotatoes in moderation is OK), White bread or wheat bread or anything made out of white flour like bagels, donuts, rolls, buns, biscuits, cakes, pastries, cookies, pizza crust, and pasta (made from white flour & egg whites) - vegetarian pasta or spinach or wheat pasta is OK. Multigrain breads like Arnold's or Pepperidge Farm, or multigrain sandwich thins or flatbreads.  Diet, exercise and weight loss can reverse and cure diabetes in the early stages.  Diet, exercise and weight loss is very important in the control and prevention of complications of diabetes which affects every system in your body, ie. Brain - dementia/stroke, eyes - glaucoma/blindness, heart - heart attack/heart failure, kidneys - dialysis, stomach - gastric paralysis, intestines - malabsorption, nerves - severe painful neuritis, circulation - gangrene & loss of a leg(s), and finally cancer and Alzheimers.    I recommend avoid fried & greasy foods,  sweets/candy, white rice (brown or wild rice or Quinoa is OK), white potatoes (sweet potatoes are OK) - anything made from white flour - bagels, doughnuts, rolls, buns, biscuits,white and wheat breads, pizza crust and traditional pasta made of white flour & egg white(vegetarian pasta or spinach or wheat pasta is OK).  Multi-grain bread is OK - like multi-grain flat bread or sandwich thins. Avoid alcohol in excess. Exercise is also important.    Eat all the vegetables you want - avoid meat, especially red meat and dairy - especially cheese.  Cheese is the most  concentrated form of trans-fats which is the worst thing to clog up our arteries. Veggie cheese is OK which can be found in the fresh produce section at [employer] or [employer] or [employer]   

## 2016-08-21 ENCOUNTER — Ambulatory Visit (INDEPENDENT_AMBULATORY_CARE_PROVIDER_SITE_OTHER): Payer: Medicare Other | Admitting: Podiatry

## 2016-08-21 ENCOUNTER — Encounter: Payer: Self-pay | Admitting: Podiatry

## 2016-08-21 DIAGNOSIS — E13621 Other specified diabetes mellitus with foot ulcer: Secondary | ICD-10-CM | POA: Diagnosis not present

## 2016-08-21 DIAGNOSIS — E1149 Type 2 diabetes mellitus with other diabetic neurological complication: Secondary | ICD-10-CM | POA: Diagnosis not present

## 2016-08-21 DIAGNOSIS — L97521 Non-pressure chronic ulcer of other part of left foot limited to breakdown of skin: Secondary | ICD-10-CM

## 2016-08-21 DIAGNOSIS — N184 Chronic kidney disease, stage 4 (severe): Secondary | ICD-10-CM | POA: Diagnosis not present

## 2016-08-21 DIAGNOSIS — L97524 Non-pressure chronic ulcer of other part of left foot with necrosis of bone: Secondary | ICD-10-CM

## 2016-08-21 DIAGNOSIS — M2450 Contracture, unspecified joint: Secondary | ICD-10-CM

## 2016-08-21 DIAGNOSIS — L84 Corns and callosities: Secondary | ICD-10-CM | POA: Diagnosis not present

## 2016-08-21 DIAGNOSIS — I2510 Atherosclerotic heart disease of native coronary artery without angina pectoris: Secondary | ICD-10-CM | POA: Diagnosis not present

## 2016-08-21 DIAGNOSIS — Z6829 Body mass index (BMI) 29.0-29.9, adult: Secondary | ICD-10-CM | POA: Diagnosis not present

## 2016-08-21 DIAGNOSIS — E11621 Type 2 diabetes mellitus with foot ulcer: Secondary | ICD-10-CM

## 2016-08-21 DIAGNOSIS — I129 Hypertensive chronic kidney disease with stage 1 through stage 4 chronic kidney disease, or unspecified chronic kidney disease: Secondary | ICD-10-CM | POA: Diagnosis not present

## 2016-08-21 DIAGNOSIS — E1129 Type 2 diabetes mellitus with other diabetic kidney complication: Secondary | ICD-10-CM | POA: Diagnosis not present

## 2016-08-22 ENCOUNTER — Other Ambulatory Visit: Payer: Self-pay | Admitting: Internal Medicine

## 2016-08-24 NOTE — Progress Notes (Signed)
  Subjective: [name] is a 71 y.o. is seen today in office s/p left foot I&D, wound excision, bone debridement preformed on 05/14/16. She also had an extensor tenotomy to the hallux. Since last appointment she states that she is doing well and she is very happy with the outcome so far. She has not had any drainage coming from the wound the last 3 weeks and she denies any redness or swelling or any pain. She is also saved to be more active and she not had any problems. She required to possible shoe. Also she is inquiring about whether at with an orthotic. She wants orthotic more to help with the arch of her foot and I want to do more an orthotic to help take pressure off this wound to help prevent recurrence. Denies any systemic complaints such as fevers, chills, nausea, vomiting. No calf pain, chest pain, shortness of breath.   Objective: General: No acute distress, AAOx3  DP/PT pulses palpable 2/4, CRT < 3 sec to all digits.  Protective sensation intact. Motor function intact.  Left foot: Hyperkeratotic tissue is present over the area of the previous wound. Upon debridement the underlying wound actually appears to be healed today and there is no skin breakdown. There is no drainage or pus there is no swelling erythema, ascending cellulitis. There is no fluctuance, crepitus, malodor. HAV continues however the hallux to sit down in a rectus position in the sagittal plane. There is no other open lesions or pre-ulcerative lesions identified today. No pain with calf compression, swelling, warmth, erythema.   Assessment and Plan:  Status post left foot wound debridement, bone biopsy/debridement; extensor tenotomy  -Treatment options discussed including all alternatives, risks, and complications -I sharply debrided the hyperkeratotic tissue over the ulcer the wound is actually healed today. There is no signs of infection. I'm very happy with the outcome thus far. Discussed that she can chart is slowly  return to the shoe but I did give her offloading pads to wear over the area. She does look at her foot every day for this any irritation or started to be any recurrent she is to go back into the surgical shoe. Also I will follow up with [name] in regards to the orthotic. She does not want a diabetic insert and this can help with the arch.   [name], DPM

## 2016-08-27 DIAGNOSIS — R531 Weakness: Secondary | ICD-10-CM | POA: Diagnosis not present

## 2016-08-27 DIAGNOSIS — R2681 Unsteadiness on feet: Secondary | ICD-10-CM | POA: Diagnosis not present

## 2016-08-27 DIAGNOSIS — R262 Difficulty in walking, not elsewhere classified: Secondary | ICD-10-CM | POA: Diagnosis not present

## 2016-08-28 DIAGNOSIS — R531 Weakness: Secondary | ICD-10-CM | POA: Diagnosis not present

## 2016-08-28 DIAGNOSIS — R262 Difficulty in walking, not elsewhere classified: Secondary | ICD-10-CM | POA: Diagnosis not present

## 2016-08-28 DIAGNOSIS — R2681 Unsteadiness on feet: Secondary | ICD-10-CM | POA: Diagnosis not present

## 2016-09-02 DIAGNOSIS — R262 Difficulty in walking, not elsewhere classified: Secondary | ICD-10-CM | POA: Diagnosis not present

## 2016-09-02 DIAGNOSIS — R2681 Unsteadiness on feet: Secondary | ICD-10-CM | POA: Diagnosis not present

## 2016-09-02 DIAGNOSIS — R531 Weakness: Secondary | ICD-10-CM | POA: Diagnosis not present

## 2016-09-09 ENCOUNTER — Ambulatory Visit: Payer: Medicare Other | Admitting: Orthotics

## 2016-09-09 NOTE — Progress Notes (Signed)
Patient came in today to discuss diabetic shoes/inserts...she did not want to proceed as she could not make a chose on diabetic shoes that allowed enough room to accommodate hammertoes right.   She wants to go to [employer] in [place] to pick out shoes/sandal and have diabetic inserts shaped to their template.

## 2016-09-18 ENCOUNTER — Ambulatory Visit (INDEPENDENT_AMBULATORY_CARE_PROVIDER_SITE_OTHER): Payer: Medicare Other | Admitting: Podiatry

## 2016-09-18 ENCOUNTER — Encounter: Payer: Self-pay | Admitting: Podiatry

## 2016-09-18 DIAGNOSIS — I2510 Atherosclerotic heart disease of native coronary artery without angina pectoris: Secondary | ICD-10-CM

## 2016-09-18 DIAGNOSIS — M2041 Other hammer toe(s) (acquired), right foot: Secondary | ICD-10-CM | POA: Diagnosis not present

## 2016-09-18 DIAGNOSIS — M2011 Hallux valgus (acquired), right foot: Secondary | ICD-10-CM | POA: Diagnosis not present

## 2016-09-18 DIAGNOSIS — M2042 Other hammer toe(s) (acquired), left foot: Secondary | ICD-10-CM | POA: Diagnosis not present

## 2016-09-18 DIAGNOSIS — L84 Corns and callosities: Secondary | ICD-10-CM

## 2016-09-21 NOTE — Progress Notes (Signed)
  Subjective: [name] is a 71 y.o. is seen today in office s/p left foot I&D, wound excision, bone debridement preformed on 05/14/16. She presents for follow up evaluation of wound to left foot. She states that she's not had any drainage of the last 2 months and she feels the area is doing very well and she's been wearing a regular shoe/sandal any problems. She is awaiting her custom orthotic to be made. She has new concerns of the area to the top of her right fourth toe which did get irritation and wearing a closed in shoe. Because of this she has been wearing a open toed sandal. She denies any open sore to the right foot she denies any drainage or pus. The areas healing. Denies any systemic complaints such as fevers, chills, nausea, vomiting. No calf pain, chest pain, shortness of breath.   Objective: General: No acute distress, AAOx3  DP/PT pulses palpable 2/4, CRT < 3 sec to all digits.  Protective sensation intact. Motor function intact.  Left foot: Hyperkeratotic tissue is present over the area of the previous wound. Upon debridement the wound continues to be healed and there is no underlying ulceration, drainage or any signs of infection. There is no wound left foot. She is very happy with this. She does have new concerns in the right third toe dorsally along a hammertoe along the dorsal PIPJ with some erythema to the toe. This is from irritation shoes as opposed to infection is no skin breakdown however it is pre-ulcerative. There is no warmth to the area and there is no ascending synovitis. This no fluctuance or crepitus. No other open lesions or pre-ulcerative lesions are identified at this time. There is no pain with calf compression, swelling, warmth, erythema.  Assessment and Plan:  Healed ulceration left foot with new pre-lesions due to hammertoe on the right foot.  -Treatment options discussed including all alternatives, risks, and complications -I sharply debrided hyperkeratotic  lesion to the left foot today without any complications or bleeding. The underlying wound appears to be healed. However she does have new concerns of a pre-ulcerative area to the right foot due to the hammertoe. Offloading pads were dispensed. Continue with open tissues until series completed the heel. She has follow-up with [name] hopefully for her orthotics soon. She has not been able to get new shoes that her and recommended discussed. She will look into this in the near future. -Monitor for any clinical signs or symptoms of infection and directed to call the office immediately should any occur or go to the ER. -Follow-up as scheduled or sooner if needed. If the area and the right foot is not healed next 2 weeks or there is any worsening the flap to call the office.  Celesta Gentile, DPM

## 2016-10-07 ENCOUNTER — Encounter: Payer: Self-pay | Admitting: Internal Medicine

## 2016-10-10 NOTE — Progress Notes (Signed)
Patient ID: [name], female   DOB: 03/04/1946, 71 y.o.   MRN: [id number]  MEDICARE ANNUAL WELLNESS VISIT AND FOLLOW UP  Assessment:     Essential hypertension - continue medications, DASH diet, exercise and monitor at home. Call if greater than 130/80.  -     CBC with Differential/Platelet -     BASIC METABOLIC PANEL WITH GFR -     Hepatic function panel -     TSH  Atherosclerosis of native coronary artery of native heart without angina pectoris ADD Control blood pressure, cholesterol, glucose, increase exercise.   Congestive heart failure, unspecified HF chronicity, unspecified heart failure type ([place]) Weight stable, decrease sodium, decrease weight Monitor weight daily  Peripheral vascular disease (HCC) Control blood pressure, cholesterol, glucose, increase exercise.   EMPHYSEMA Monitor CXR, continue inhaler  Asthma with status asthmaticus, unspecified asthma severity, unspecified whether persistent Monitor CXR, continue inhaler  Uncontrolled type 2 diabetes mellitus with diabetic neuropathy, without long-term current use of insulin ([place]) Discussed general issues about diabetes pathophysiology and management., Educational material distributed., Suggested low cholesterol diet., Encouraged aerobic exercise., Discussed foot care., Reminded to get yearly retinal exam. -     Hemoglobin A1c  CKD stage 3 due to type 2 diabetes mellitus ([place]) Discussed general issues about diabetes pathophysiology and management., Educational material distributed., Suggested low cholesterol diet., Encouraged aerobic exercise., Discussed foot care., Reminded to get yearly retinal exam. -     Hemoglobin A1c  Diabetes mellitus type 2, insulin dependent ([place]) Discussed general issues about diabetes pathophysiology and management., Educational material distributed., Suggested low cholesterol diet., Encouraged aerobic exercise., Discussed foot care., Reminded to get yearly retinal exam. -      Hemoglobin A1c  Gouty arthritis Gout- recheck Uric acid as needed, Diet discussed, continue medications.  CKD (chronic kidney disease) stage 3, GFR 30-59 ml/min avoid NSAIDS, monitor sugars, will monitor - may suggest referral to nephrologist soon but patient does not want to see so many doctors.  -     Hepatic function panel  Hyperlipidemia -continue medications, check lipids, decrease fatty foods, increase activity.  -     Lipid panel  Malignant neoplasm of upper-inner quadrant of left breast in female, estrogen receptor positive ([place]) Continue follow up  Vitamin D deficiency Continue supplement  Medication management -     Magnesium  BMI 29.0-29.9,adult  Encounter for Medicare annual wellness exam  Diabetes mellitus due to underlying condition with foot ulcer (CODE) ([place]) continue follow up podiatry Feet checks Control sugar  LVH (left ventricular hypertrophy) due to hypertensive disease, with heart failure ([place]) - continue medications, DASH diet, exercise and monitor at home. Call if greater than 130/80.  No symptoms of fluid overload at this time  Urinary frequency Check urine  Over 30 minutes of exam, counseling, chart review, and critical decision making was performed  Plan:   During the course of the visit the patient was educated and counseled about appropriate screening and preventive services including:    Pneumococcal vaccine   Influenza vaccine  Td vaccine  Prevnar 13  Screening electrocardiogram  Screening mammography  Bone densitometry screening  Colorectal cancer screening  Diabetes screening  Glaucoma screening  Nutrition counseling   Advanced directives: given info/requested copies    Subjective:   [name] is a 71 y.o. female who presents for Medicare Annual Wellness Visit and 3 month follow up on hypertension, diabetes, hyperlipidemia, vitamin D def.   Her blood pressure has been controlled at home, today their BP  is  BP: 128/74 She does not workout. She denies chest pain, shortness of breath, dizziness.  She has had breast cancer in 2014, follows with [name], she is on anastrozole.  Her last Echo showed an EF of 40-45% in 2011, she does not follow up with a heart doctor.  She is on cholesterol medication and denies myalgias. Her cholesterol is at goal. The cholesterol last visit was:   Lab Results  Component Value Date   CHOL 130 07/01/2016   HDL 24 (L) 07/01/2016   LDLCALC 28 07/01/2016   TRIG 391 (H) 07/01/2016   CHOLHDL 5.4 (H) 07/01/2016   She has been working on diet and exercise for Diabetes with diabetic chronic kidney disease, with other circulatory complications and with diabetic polyneuropathy, she is on bASA, she is on ACE/ARB, he is on 65 and 65 in the evening, and denies paresthesia of the feet, polydipsia, polyuria and visual disturbances. Last A1C was:  Lab Results  Component Value Date   HGBA1C 9.9 (H) 07/01/2016   Last GFR  Lab Results  Component Value Date   GFRNONAA 16 (L) 07/01/2016   Patient is on Vitamin D supplement. Lab Results  Component Value Date   VD25OH 34 07/01/2016     BMI is Body mass index is 29.79 kg/m., she is working on diet and exercise. Has had some weight gain with recent foot surgery for DM ulcer of left foot due to [name].  Wt Readings from Last 3 Encounters:  10/13/16 179 lb (81.2 kg)  08/20/16 176 lb (79.8 kg)  07/15/16 172 lb 6.4 oz (78.2 kg)     Medication Review Current Outpatient Prescriptions on File Prior to Visit  Medication Sig Dispense Refill  . allopurinol (ZYLOPRIM) 100 MG tablet TAKE 1 TABLET TWICE DAILY 180 tablet 1  . anastrozole (ARIMIDEX) 1 MG tablet TAKE 1 TABLET EVERY DAY 90 tablet 3  . aspirin 325 MG tablet Take 325 mg by mouth at bedtime.     . carvedilol (COREG) 12.5 MG tablet TAKE 1 TABLET TWICE DAILY FOR BLOOD PRESSURE 180 tablet 3  . glucose blood test strip Check sugars 3x a day-DX-E10.22 200 each 11  .  hydrochlorothiazide (HYDRODIURIL) 12.5 MG tablet TAKE 1 TABLET EVERY DAY 90 tablet 1  . insulin NPH-regular Human (NOVOLIN 70/30) (70-30) 100 UNIT/ML injection Inject into the skin. Injecting 50 units in the AM and 50 units in the PM.    . Misc. Devices MISC Custom molded inserts for diabetic shoes - ICD10: E11.41. Detailed foot exam performed at the time of the visit on 02/17/2014. Patient has neuropathy, multiple digit amputations and calluses. 2 each 1  . Polyvinyl Alcohol-Povidone (REFRESH OP) Place 1 drop into both eyes daily as needed (dry eyes).    . rosuvastatin (CRESTOR) 20 MG tablet TAKE 1 TABLET EVERY DAY 90 tablet 1  . valsartan (DIOVAN) 320 MG tablet TAKE 1 TABLET EVERY MORNING FOR BLOOD PRESSURE 90 tablet 1   No current facility-administered medications on file prior to visit.     Current Problems (verified) Patient Active Problem List   Diagnosis Date Noted  . CKD stage 3 due to type 2 diabetes mellitus ([place]) 05/08/2015  . Diabetes mellitus type 2, insulin dependent ([place]) 05/08/2015  . BMI 29.0-29.9,adult 02/14/2015  . Medication management 08/04/2014  . Type II diabetes mellitus with neurological manifestations, uncontrolled ([place]) 02/17/2014  . Gouty arthritis   . Vitamin D deficiency   . CKD (chronic kidney disease) stage 3, GFR  30-59 ml/min 06/04/2013  . Breast cancer of upper-inner quadrant of left female breast ([place]) 02/14/2013  . Peripheral vascular disease ([place]) 09/05/2009  . Hyperlipidemia 06/20/2009  . Essential hypertension 06/20/2009  . Coronary atherosclerosis 06/20/2009  . Congestive heart failure ([place]) 06/20/2009  . EMPHYSEMA 06/20/2009  . Asthma 06/20/2009    Screening Tests Immunization History  Administered Date(s) Administered  . Influenza, High Dose Seasonal PF 01/30/2014, 02/14/2015, 12/17/2015  . Influenza-Unspecified 02/05/2013  . Pneumococcal Conjugate-13 01/30/2014  . Pneumococcal Polysaccharide-23 01/07/2011  . Td 01/13/2012  . Zoster  01/13/2012    Preventative care: Last colonoscopy: 2011 Last mammogram: 12/2015 [id number] Echo 2011  Prior vaccinations: TD or Tdap: 2013  Influenza: 02/2015  Pneumococcal: 2012 Prevnar13: 2015 Shingles/Zostavax: 2013  Names of Other Physician/Practitioners you currently use: 1. [facility]- here for primary care 2. Dr. Bing Plume, eye doctor, last visit 2017 3. Dr. Satira Sark, Dr. Mallie Snooks , dentist, last visit feb 2018 Patient Care Team: Unk Pinto, MD as PCP - General (Internal Medicine) Magrinat, Virgie Dad, MD as Consulting Physician (Oncology) Kyung Rudd, MD as Consulting Physician (Radiation Oncology) Newt Minion, MD as Consulting Physician (Orthopedic Surgery) Philemon Kingdom, MD as Consulting Physician (Internal Medicine) Josue Hector, MD as Consulting Physician (Cardiology) Inda Castle, MD as Consulting Physician (Gastroenterology) Rolm Bookbinder, MD as Consulting Physician (General Surgery) Calvert Cantor, MD as Consulting Physician (Ophthalmology) Madelon Lips, MD as Consulting Physician (Nephrology) Allergies Allergies  Allergen Reactions  . Atenolol Other (See Comments)    Exacerbates gout  . Contrast Media [Iodinated Diagnostic Agents] Rash  . Latex Rash  . 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 FAMILY HISTORY Her family history includes Breast  cancer in her maternal aunt; Breast cancer (age of onset: 21) in her sister; Breast cancer (age of onset: 27) in her sister; Hypertension in her brother; Kidney cancer (age of onset: 49) in her brother; Throat cancer (age of onset: 40) in her father. SOCIAL HISTORY She  reports that she has never smoked. She has never used smokeless tobacco. She reports that she does not drink alcohol or use drugs.  MEDICARE WELLNESS OBJECTIVES: Physical activity: Current Exercise Habits: The patient does not participate in regular exercise at present Cardiac risk factors: Cardiac Risk Factors include: advanced age (>8men, >105 women);diabetes mellitus;dyslipidemia;hypertension;obesity (BMI >30kg/m2);sedentary lifestyle Depression/mood screen:   Depression screen [facility] 2/9 10/13/2016  Decreased Interest 0  Down, Depressed, Hopeless 0  PHQ - 2 Score 0  Some recent data might be hidden    ADLs:  In your present state of health, do you have any difficulty performing the following activities: 07/01/2016  Hearing? N  Vision? N  Difficulty concentrating or making decisions? N  Dressing or bathing? N  Doing errands, shopping? Y  Some recent data might be hidden     Cognitive Testing  Alert? Yes  Normal Appearance?Yes  Oriented to person? Yes  Place? Yes   Time? Yes  Recall of three objects?  Yes  Can perform simple calculations? Yes  Displays appropriate judgment?Yes  Can read the  correct time from a watch face?Yes  EOL planning: Does Patient Have a Medical Advance Directive?: Yes Type of Advance Directive: Healthcare Power of Attorney, Living will Copy of [place] in Chart?: No - copy requested   Objective:   Today's Vitals   10/13/16 1056  BP: 128/74  Pulse: 77  Resp: 14  Temp: (!) 97.5 F (36.4 C)  SpO2: 99%  Weight: 179 lb (81.2 kg)  Height: 5\' 5"  (1.651 m)  PainSc: 0-No pain   Body mass index is 29.79 kg/m.  General appearance: alert, no distress, WD/WN,   female HEENT: normocephalic, sclerae anicteric, TMs pearly, nares patent, no discharge or erythema, pharynx normal Oral cavity: MMM, no lesions Neck: supple, no lymphadenopathy, no thyromegaly, no masses Heart: RRR, normal S1, S2, no murmurs Lungs: CTA bilaterally, no wheezes, rhonchi, or rales Abdomen: +bs, soft, non tender, non distended, no masses, no hepatomegaly, no splenomegaly Musculoskeletal: nontender, no swelling, no obvious deformity Extremities: no edema, no cyanosis, no clubbing Pulses: 2+ symmetric, upper and lower extremities, normal cap refill Neurological: alert, oriented x 3, CN2-12 intact, strength normal upper extremities and lower extremities, sensation normal throughout, DTRs 2+ throughout, no cerebellar signs, gait normal Psychiatric: normal affect, behavior normal, pleasant  Breast: defer Gyn: defer Rectal: defer   Medicare Attestation I have personally reviewed: The patient's medical and social history Their use of alcohol, tobacco or illicit drugs Their current medications and supplements The patient's functional ability including ADLs,fall risks, home safety risks, cognitive, and hearing and visual impairment Diet and physical activities Evidence for depression or mood disorders  The patient's weight, height, BMI, and visual acuity have been recorded in the chart.  I have made referrals, counseling, and provided education to the patient based on review of the above and I have provided the patient with a written personalized care plan for preventive services.     Vicie Mutters, PA-C   10/13/2016

## 2016-10-13 ENCOUNTER — Encounter: Payer: Self-pay | Admitting: Physician Assistant

## 2016-10-13 ENCOUNTER — Ambulatory Visit (INDEPENDENT_AMBULATORY_CARE_PROVIDER_SITE_OTHER): Payer: Medicare Other | Admitting: Physician Assistant

## 2016-10-13 VITALS — BP 128/74 | HR 77 | Temp 97.5°F | Resp 14 | Ht 65.0 in | Wt 179.0 lb

## 2016-10-13 DIAGNOSIS — E1122 Type 2 diabetes mellitus with diabetic chronic kidney disease: Secondary | ICD-10-CM

## 2016-10-13 DIAGNOSIS — E119 Type 2 diabetes mellitus without complications: Secondary | ICD-10-CM | POA: Diagnosis not present

## 2016-10-13 DIAGNOSIS — E1165 Type 2 diabetes mellitus with hyperglycemia: Secondary | ICD-10-CM | POA: Diagnosis not present

## 2016-10-13 DIAGNOSIS — Z79899 Other long term (current) drug therapy: Secondary | ICD-10-CM | POA: Diagnosis not present

## 2016-10-13 DIAGNOSIS — R350 Frequency of micturition: Secondary | ICD-10-CM

## 2016-10-13 DIAGNOSIS — I2510 Atherosclerotic heart disease of native coronary artery without angina pectoris: Secondary | ICD-10-CM | POA: Diagnosis not present

## 2016-10-13 DIAGNOSIS — J438 Other emphysema: Secondary | ICD-10-CM

## 2016-10-13 DIAGNOSIS — I10 Essential (primary) hypertension: Secondary | ICD-10-CM | POA: Diagnosis not present

## 2016-10-13 DIAGNOSIS — E1140 Type 2 diabetes mellitus with diabetic neuropathy, unspecified: Secondary | ICD-10-CM

## 2016-10-13 DIAGNOSIS — M109 Gout, unspecified: Secondary | ICD-10-CM | POA: Diagnosis not present

## 2016-10-13 DIAGNOSIS — Z170 Estrogen receptor positive status [ER+]: Secondary | ICD-10-CM

## 2016-10-13 DIAGNOSIS — I509 Heart failure, unspecified: Secondary | ICD-10-CM

## 2016-10-13 DIAGNOSIS — Z794 Long term (current) use of insulin: Secondary | ICD-10-CM

## 2016-10-13 DIAGNOSIS — R6889 Other general symptoms and signs: Secondary | ICD-10-CM | POA: Diagnosis not present

## 2016-10-13 DIAGNOSIS — IMO0002 Reserved for concepts with insufficient information to code with codable children: Secondary | ICD-10-CM

## 2016-10-13 DIAGNOSIS — Z6829 Body mass index (BMI) 29.0-29.9, adult: Secondary | ICD-10-CM

## 2016-10-13 DIAGNOSIS — N183 Chronic kidney disease, stage 3 unspecified: Secondary | ICD-10-CM

## 2016-10-13 DIAGNOSIS — J45902 Unspecified asthma with status asthmaticus: Secondary | ICD-10-CM

## 2016-10-13 DIAGNOSIS — E559 Vitamin D deficiency, unspecified: Secondary | ICD-10-CM

## 2016-10-13 DIAGNOSIS — I110 Hypertensive heart disease with heart failure: Secondary | ICD-10-CM

## 2016-10-13 DIAGNOSIS — Z0001 Encounter for general adult medical examination with abnormal findings: Secondary | ICD-10-CM | POA: Diagnosis not present

## 2016-10-13 DIAGNOSIS — E782 Mixed hyperlipidemia: Secondary | ICD-10-CM | POA: Diagnosis not present

## 2016-10-13 DIAGNOSIS — I739 Peripheral vascular disease, unspecified: Secondary | ICD-10-CM

## 2016-10-13 DIAGNOSIS — C50212 Malignant neoplasm of upper-inner quadrant of left female breast: Secondary | ICD-10-CM

## 2016-10-13 DIAGNOSIS — Z0000 Encounter for general adult medical examination without abnormal findings: Secondary | ICD-10-CM

## 2016-10-13 DIAGNOSIS — E08621 Diabetes mellitus due to underlying condition with foot ulcer: Secondary | ICD-10-CM

## 2016-10-13 LAB — CBC WITH DIFFERENTIAL/PLATELET
Basophils Absolute: 0 cells/uL (ref 0–200)
Basophils Relative: 0 %
EOS PCT: 3 %
Eosinophils Absolute: 312 cells/uL (ref 15–500)
HCT: 39.2 % (ref 35.0–45.0)
Hemoglobin: 12.6 g/dL (ref 11.7–15.5)
LYMPHS ABS: 2080 {cells}/uL (ref 850–3900)
LYMPHS PCT: 20 %
MCH: 29.2 pg (ref 27.0–33.0)
MCHC: 32.1 g/dL (ref 32.0–36.0)
MCV: 90.7 fL (ref 80.0–100.0)
MPV: 10.8 fL (ref 7.5–12.5)
Monocytes Absolute: 624 cells/uL (ref 200–950)
Monocytes Relative: 6 %
NEUTROS PCT: 71 %
Neutro Abs: 7384 cells/uL (ref 1500–7800)
Platelets: 238 10*3/uL (ref 140–400)
RBC: 4.32 MIL/uL (ref 3.80–5.10)
RDW: 14.7 % (ref 11.0–15.0)
WBC: 10.4 10*3/uL (ref 3.8–10.8)

## 2016-10-13 LAB — HEPATIC FUNCTION PANEL
ALT: 14 U/L (ref 6–29)
AST: 16 U/L (ref 10–35)
Albumin: 3.9 g/dL (ref 3.6–5.1)
Alkaline Phosphatase: 85 U/L (ref 33–130)
BILIRUBIN INDIRECT: 0.2 mg/dL (ref 0.2–1.2)
Bilirubin, Direct: 0.1 mg/dL (ref <=0.2)
Total Bilirubin: 0.3 mg/dL (ref 0.2–1.2)
Total Protein: 6.7 g/dL (ref 6.1–8.1)

## 2016-10-13 LAB — BASIC METABOLIC PANEL WITH GFR
BUN: 74 mg/dL — ABNORMAL HIGH (ref 7–25)
CO2: 21 mmol/L (ref 20–31)
Calcium: 9.7 mg/dL (ref 8.6–10.4)
Chloride: 107 mmol/L (ref 98–110)
Creat: 3.47 mg/dL — ABNORMAL HIGH (ref 0.60–0.93)
GFR, EST AFRICAN AMERICAN: 15 mL/min — AB (ref >=60)
GFR, EST NON AFRICAN AMERICAN: 13 mL/min — AB (ref >=60)
Glucose, Bld: 124 mg/dL — ABNORMAL HIGH (ref 65–99)
Potassium: 5.2 mmol/L (ref 3.5–5.3)
SODIUM: 141 mmol/L (ref 135–146)

## 2016-10-13 LAB — TSH: TSH: 2.32 mIU/L

## 2016-10-13 LAB — LIPID PANEL
CHOL/HDL RATIO: 4.3 ratio (ref <5.0)
Cholesterol: 137 mg/dL (ref <200)
HDL: 32 mg/dL — ABNORMAL LOW (ref >50)
LDL Cholesterol: 63 mg/dL (ref <100)
Triglycerides: 210 mg/dL — ABNORMAL HIGH (ref <150)
VLDL: 42 mg/dL — AB (ref <30)

## 2016-10-13 NOTE — Patient Instructions (Addendum)
Pay attention to how you feel, try not to eat food that makes you feel bad  Vionic and alegria  Somogyi effect The brain needs two things: oxygen and sugar. If the blood sugar level drops too low in the early morning hours, hormones (such as growth hormone, cortisol, and catecholamines) are released to make sure you brain can still function. These help reverse the low blood sugar level but may lead to blood sugar levels that are higher than normal in the morning. This is common for patient that take insulin at night or do not ear regular snacks.  Please schedule to get up in the middle of the night to check your blood sugar.   This may be happening to you. Please eat a high protein night time snack and we will be decreasing your night time insulin as follows:    Diabetes is a very complicated disease...lets simplify it.  An easy way to look at it to understand the complications is if you think of the extra sugar floating in your blood stream as glass shards floating through your blood stream.    Diabetes affects your small vessels first: 1) The glass shards (sugar) scraps down the tiny blood vessels in your eyes and lead to diabetic retinopathy, the leading cause of blindness in the [name]. Diabetes is the leading cause of newly diagnosed adult (5 to 71 years of age) blindness in the [place].  2) The glass shards scratches down the tiny vessels of your legs leading to nerve damage called neuropathy and can lead to amputations of your feet. More than 60% of all non-traumatic amputations of lower limbs occur in people with diabetes.  3) Over time the small vessels in your brain are shredded and closed off, individually this does not cause any problems but over a long period of time many of the small vessels being blocked can lead to Vascular Dementia.   4) Your kidney's are a filter system and have a "net" that keeps certain things in the body and lets bad things out. Sugar shreds this net and  leads to kidney damage and eventually failure. Decreasing the sugar that is destroying the net and certain blood pressure medications can help stop or decrease progression of kidney disease. Diabetes was the primary cause of kidney failure in 44 percent of all new cases in 2011.  5) Diabetes also destroys the small vessels in your penis that lead to erectile dysfunction. Eventually the vessels are so damaged that you may not be responsive to cialis or viagra.   Diabetes and your large vessels: Your larger vessels consist of your coronary arteries in your heart and the carotid vessels to your brain. Diabetes or even increased sugars put you at 300% increased risk of heart attack and stroke and this is why.. The sugar scrapes down your large blood vessels and your body sees this as an internal injury and tries to repair itself. Just like you get a scab on your skin, your platelets will stick to the blood vessel wall trying to heal it. This is why we have diabetics on low dose aspirin daily, this prevents the platelets from sticking and can prevent plaque formation. In addition, your body takes cholesterol and tries to shove it into the open wound. This is why we want your LDL, or bad cholesterol, below 70.   The combination of platelets and cholesterol over 5-10 years forms plaque that can break off and cause a heart attack or stroke.  PLEASE REMEMBER:  Diabetes is preventable! Up to 52 percent of complications and morbidities among individuals with type 2 diabetes can be prevented, delayed, or effectively treated and minimized with regular visits to a health professional, appropriate monitoring and medication, and a healthy diet and lifestyle.     Bad carbs also include fruit juice, alcohol, and sweet tea. These are empty calories that do not signal to your brain that you are full.   Please remember the good carbs are still carbs which convert into sugar. So please measure them out no more than  1/2-1 cup of rice, oatmeal, pasta, and beans  Veggies are however free foods! Pile them on.   Not all fruit is created equal. Please see the list below, the fruit at the bottom is higher in sugars than the fruit at the top. Please avoid all dried fruits.

## 2016-10-14 ENCOUNTER — Other Ambulatory Visit: Payer: Self-pay | Admitting: Physician Assistant

## 2016-10-14 LAB — URINALYSIS, ROUTINE W REFLEX MICROSCOPIC
BILIRUBIN URINE: NEGATIVE
GLUCOSE, UA: NEGATIVE
KETONES UR: NEGATIVE
Nitrite: POSITIVE — AB
PH: 5 (ref 5.0–8.0)
SPECIFIC GRAVITY, URINE: 1.017 (ref 1.001–1.035)

## 2016-10-14 LAB — URINALYSIS, MICROSCOPIC ONLY
Casts: NONE SEEN [LPF]
Crystals: NONE SEEN [HPF]
WBC, UA: >60 WBC/HPF — AB (ref <=5)
YEAST: NONE SEEN [HPF]

## 2016-10-14 LAB — MAGNESIUM: MAGNESIUM: 2.4 mg/dL (ref 1.5–2.5)

## 2016-10-14 LAB — HEMOGLOBIN A1C
Hgb A1c MFr Bld: 10.2 % — ABNORMAL HIGH (ref <5.7)
Mean Plasma Glucose: 246 mg/dL

## 2016-10-14 MED ORDER — CIPROFLOXACIN HCL 250 MG PO TABS: 250.0000 mg | ORAL_TABLET | Freq: Two times a day (BID) | ORAL | 0 refills | Status: DC

## 2016-10-14 NOTE — Progress Notes (Signed)
Pt aware of lab results & voiced understanding of those results.

## 2016-10-15 LAB — URINE CULTURE

## 2016-10-16 NOTE — Progress Notes (Signed)
Pt aware of lab results & voiced understanding of those results. Pt was transferred to front in order to schedule 44mth follow up

## 2016-10-19 ENCOUNTER — Telehealth: Payer: Self-pay

## 2016-10-19 NOTE — Telephone Encounter (Signed)
Spoke with patient and she is aware of her new appts due to dr gm out of office  [name]

## 2016-10-29 DIAGNOSIS — N184 Chronic kidney disease, stage 4 (severe): Secondary | ICD-10-CM | POA: Diagnosis not present

## 2016-10-29 DIAGNOSIS — E1129 Type 2 diabetes mellitus with other diabetic kidney complication: Secondary | ICD-10-CM | POA: Diagnosis not present

## 2016-10-29 DIAGNOSIS — E13621 Other specified diabetes mellitus with foot ulcer: Secondary | ICD-10-CM | POA: Diagnosis not present

## 2016-10-29 DIAGNOSIS — L97509 Non-pressure chronic ulcer of other part of unspecified foot with unspecified severity: Secondary | ICD-10-CM | POA: Diagnosis not present

## 2016-10-29 DIAGNOSIS — I2510 Atherosclerotic heart disease of native coronary artery without angina pectoris: Secondary | ICD-10-CM | POA: Diagnosis not present

## 2016-10-29 DIAGNOSIS — Z6829 Body mass index (BMI) 29.0-29.9, adult: Secondary | ICD-10-CM | POA: Diagnosis not present

## 2016-11-05 ENCOUNTER — Other Ambulatory Visit: Payer: Self-pay | Admitting: Internal Medicine

## 2016-11-18 ENCOUNTER — Ambulatory Visit: Payer: Self-pay

## 2016-11-20 ENCOUNTER — Ambulatory Visit: Payer: Medicare Other | Admitting: Podiatry

## 2016-12-17 ENCOUNTER — Other Ambulatory Visit: Payer: Self-pay | Admitting: Internal Medicine

## 2016-12-22 ENCOUNTER — Other Ambulatory Visit: Payer: Self-pay | Admitting: Internal Medicine

## 2016-12-22 DIAGNOSIS — R948 Abnormal results of function studies of other organs and systems: Secondary | ICD-10-CM | POA: Diagnosis not present

## 2016-12-22 DIAGNOSIS — E669 Obesity, unspecified: Secondary | ICD-10-CM | POA: Diagnosis not present

## 2016-12-22 DIAGNOSIS — Z6831 Body mass index (BMI) 31.0-31.9, adult: Secondary | ICD-10-CM | POA: Diagnosis not present

## 2016-12-25 ENCOUNTER — Other Ambulatory Visit: Payer: Medicare Other

## 2016-12-25 DIAGNOSIS — Z853 Personal history of malignant neoplasm of breast: Secondary | ICD-10-CM | POA: Diagnosis not present

## 2016-12-25 DIAGNOSIS — E669 Obesity, unspecified: Secondary | ICD-10-CM | POA: Diagnosis not present

## 2016-12-25 DIAGNOSIS — Z794 Long term (current) use of insulin: Secondary | ICD-10-CM | POA: Diagnosis not present

## 2016-12-25 DIAGNOSIS — N183 Chronic kidney disease, stage 3 (moderate): Secondary | ICD-10-CM | POA: Diagnosis not present

## 2016-12-25 DIAGNOSIS — E1122 Type 2 diabetes mellitus with diabetic chronic kidney disease: Secondary | ICD-10-CM | POA: Diagnosis not present

## 2016-12-25 DIAGNOSIS — I129 Hypertensive chronic kidney disease with stage 1 through stage 4 chronic kidney disease, or unspecified chronic kidney disease: Secondary | ICD-10-CM | POA: Diagnosis not present

## 2016-12-25 DIAGNOSIS — Z6831 Body mass index (BMI) 31.0-31.9, adult: Secondary | ICD-10-CM | POA: Diagnosis not present

## 2016-12-25 DIAGNOSIS — F329 Major depressive disorder, single episode, unspecified: Secondary | ICD-10-CM | POA: Diagnosis not present

## 2016-12-25 DIAGNOSIS — R921 Mammographic calcification found on diagnostic imaging of breast: Secondary | ICD-10-CM | POA: Diagnosis not present

## 2016-12-25 DIAGNOSIS — Z780 Asymptomatic menopausal state: Secondary | ICD-10-CM | POA: Diagnosis not present

## 2016-12-25 LAB — HM MAMMOGRAPHY

## 2016-12-31 DIAGNOSIS — Z6828 Body mass index (BMI) 28.0-28.9, adult: Secondary | ICD-10-CM | POA: Diagnosis not present

## 2016-12-31 DIAGNOSIS — E1129 Type 2 diabetes mellitus with other diabetic kidney complication: Secondary | ICD-10-CM | POA: Diagnosis not present

## 2016-12-31 DIAGNOSIS — N184 Chronic kidney disease, stage 4 (severe): Secondary | ICD-10-CM | POA: Diagnosis not present

## 2016-12-31 DIAGNOSIS — I129 Hypertensive chronic kidney disease with stage 1 through stage 4 chronic kidney disease, or unspecified chronic kidney disease: Secondary | ICD-10-CM | POA: Diagnosis not present

## 2017-01-01 ENCOUNTER — Ambulatory Visit: Payer: Medicare Other | Admitting: Oncology

## 2017-01-07 DIAGNOSIS — E669 Obesity, unspecified: Secondary | ICD-10-CM | POA: Diagnosis not present

## 2017-01-07 DIAGNOSIS — Z6830 Body mass index (BMI) 30.0-30.9, adult: Secondary | ICD-10-CM | POA: Diagnosis not present

## 2017-01-07 DIAGNOSIS — Z713 Dietary counseling and surveillance: Secondary | ICD-10-CM | POA: Diagnosis not present

## 2017-01-12 ENCOUNTER — Other Ambulatory Visit: Payer: Self-pay | Admitting: Internal Medicine

## 2017-01-15 DIAGNOSIS — Z794 Long term (current) use of insulin: Secondary | ICD-10-CM | POA: Diagnosis not present

## 2017-01-15 DIAGNOSIS — E1122 Type 2 diabetes mellitus with diabetic chronic kidney disease: Secondary | ICD-10-CM | POA: Diagnosis not present

## 2017-01-15 DIAGNOSIS — E669 Obesity, unspecified: Secondary | ICD-10-CM | POA: Diagnosis not present

## 2017-01-15 DIAGNOSIS — Z6830 Body mass index (BMI) 30.0-30.9, adult: Secondary | ICD-10-CM | POA: Diagnosis not present

## 2017-01-21 NOTE — Progress Notes (Signed)
Patient ID: [name], female   DOB: [birth year], 71 y.o.   MRN: [id number]  FOLLOW UP  Assessment:    Essential hypertension - continue medications, DASH diet, exercise and monitor at home. Call if greater than 130/80.  -     CBC with Differential/Platelet -     BASIC METABOLIC PANEL WITH GFR -     Hepatic function panel -     TSH  Congestive heart failure, unspecified HF chronicity, unspecified heart failure type (HCC) Weight stable, decrease sodium, decrease weight Monitor weight daily  Peripheral vascular disease (HCC) Control blood pressure, cholesterol, glucose, increase exercise.   EMPHYSEMA Monitor CXR, continue inhaler  Uncontrolled type 2 diabetes mellitus with diabetic neuropathy, without long-term current use of insulin ([place]) Discussed general issues about diabetes pathophysiology and management., Educational material distributed., Suggested low cholesterol diet., Encouraged aerobic exercise., Discussed foot care., Reminded to get yearly retinal exam. -     Hemoglobin A1c - CONTINUE [facility] AND WEIGHT LOSS  CKD stage 3 due to type 2 diabetes mellitus ([place]) Discussed general issues about diabetes pathophysiology and management., Educational material distributed., Suggested low cholesterol diet., Encouraged aerobic exercise., Discussed foot care., Reminded to get yearly retinal exam. -     Hemoglobin A1c - having foamy urine, will check urine  Diabetes mellitus type 2, insulin dependent ([place]) Discussed general issues about diabetes pathophysiology and management., Educational material distributed., Suggested low cholesterol diet., Encouraged aerobic exercise., Discussed foot care., Reminded to get yearly retinal exam. -     Hemoglobin A1c  Hyperlipidemia -continue medications, check lipids, decrease fatty foods, increase activity.  -     Lipid panel  Malignant neoplasm of upper-inner quadrant of left breast in female, estrogen receptor positive ([place]) Continue follow  up  Medication management -     Magnesium  BMI 29.0-29.9,adult  Diabetes mellitus due to underlying condition with foot ulcer (CODE) ([place]) continue follow up podiatry Feet checks Control sugar  LVH (left ventricular hypertrophy) due to hypertensive disease, with heart failure ([place]) - continue medications, DASH diet, exercise and monitor at home. Call if greater than 130/80.  No symptoms of fluid overload at this time   Over 30 minutes of exam, counseling, chart review, and critical decision making was performed Future Appointments Date Time Provider [place]  02/02/2017 10:30 AM Magrinat, Virgie Dad, MD [facility] None  02/02/2017 11:00 AM [facility] LAB 4 [facility] None  04/28/2017 11:15 AM Vicie Mutters, PA-C [facility] None  08/04/2017 3:00 PM Unk Pinto, MD [facility] None    Subjective:   [name] is a 71 y.o. female who presents for 3 month follow up on hypertension, diabetes, hyperlipidemia, vitamin D def.   Her blood pressure has been controlled at home, today their BP is BP: 126/74 She does not workout. She denies chest pain, shortness of breath, dizziness.  She has had breast cancer in 2014, follows with [name], she is on anastrozole.  Her last Echo showed an EF of 40-45% in 2011.  She is on cholesterol medication and denies myalgias. Her cholesterol is at goal. The cholesterol last visit was checked at [facility] 12/22/2016 Trigs 237, HDL 29, LDL 64 She has been working on diet and exercise for Diabetes with diabetic chronic kidney disease, with other circulatory complications and with diabetic polyneuropathy, she is on bASA, she is on ACE/ARB, she is seeing [facility] management at [facility] and she states her sugars have done better, she is on 60 in the AM and 20-25 in the evening decreasing  it slowly, she has 41 inch waist, and denies paresthesia of the feet, polydipsia, polyuria and visual disturbances. Last A1C was was checked at [facility] 12/22/2016 A1c  was 9.6 She follows with kidney doctor, she is off ARB and on hydralazine. She has started to see some foam in her urine. Last GFR  15  BMI is Body mass index is 29.35 kg/m., she is working on diet and exercise. Has had some weight gain with recent foot surgery for DM ulcer of left foot due to [name].  Wt Readings from Last 3 Encounters:  01/22/17 176 lb 6.4 oz (80 kg)  10/13/16 179 lb (81.2 kg)  08/20/16 176 lb (79.8 kg)     Medication Review Current Outpatient Prescriptions on File Prior to Visit  Medication Sig Dispense Refill  . allopurinol (ZYLOPRIM) 100 MG tablet TAKE 1 TABLET TWICE DAILY 180 tablet 1  . anastrozole (ARIMIDEX) 1 MG tablet TAKE 1 TABLET EVERY DAY 90 tablet 3  . aspirin 325 MG tablet Take 325 mg by mouth at bedtime.     . carvedilol (COREG) 12.5 MG tablet TAKE 1 TABLET TWICE DAILY FOR BLOOD PRESSURE 180 tablet 1  . glucose blood test strip Check sugars 3x a day-DX-E10.22 200 each 11  . hydrochlorothiazide (HYDRODIURIL) 12.5 MG tablet TAKE 1 TABLET EVERY DAY 90 tablet 1  . insulin NPH-regular Human (NOVOLIN 70/30) (70-30) 100 UNIT/ML injection Inject into the skin. Injecting 50 units in the AM and 50 units in the PM.    . Misc. Devices MISC Custom molded inserts for diabetic shoes - ICD10: E11.41. Detailed foot exam performed at the time of the visit on 02/17/2014. Patient has neuropathy, multiple digit amputations and calluses. 2 each 1  . Polyvinyl Alcohol-Povidone (REFRESH OP) Place 1 drop into both eyes daily as needed (dry eyes).    . rosuvastatin (CRESTOR) 20 MG tablet TAKE 1 TABLET EVERY DAY 90 tablet 1   No current facility-administered medications on file prior to visit.     Current Problems (verified) Patient Active Problem List   Diagnosis Date Noted  . CKD stage 3 due to type 2 diabetes mellitus ([place]) 05/08/2015  . Diabetes mellitus type 2, insulin dependent ([place]) 05/08/2015  . BMI 29.0-29.9,adult 02/14/2015  . Medication management 08/04/2014   . Type II diabetes mellitus with neurological manifestations, uncontrolled ([place]) 02/17/2014  . Gouty arthritis   . Vitamin D deficiency   . CKD (chronic kidney disease) stage 3, GFR 30-59 ml/min (HCC) 06/04/2013  . Breast cancer of upper-inner quadrant of left female breast ([place]) 02/14/2013  . Peripheral vascular disease ([place]) 09/05/2009  . Hyperlipidemia 06/20/2009  . Essential hypertension 06/20/2009  . Coronary atherosclerosis 06/20/2009  . Congestive heart failure ([place]) 06/20/2009  . EMPHYSEMA 06/20/2009  . Asthma 06/20/2009    Allergies Allergies  Allergen Reactions  . Atenolol Other (See Comments)    Exacerbates gout  . Contrast Media [Iodinated Diagnostic Agents] Rash  . Latex Rash  . Omnipaque [Iohexol] Rash    Surgical History: reviewed and unchanged Family History: reviewed and unchanged Social History: reviewed and unchanged   Objective:   Today's Vitals   01/22/17 1116  BP: 126/74  Pulse: 80  Resp: 16  Temp: (!) 97.5 F (36.4 C)  SpO2: 99%  Weight: 176 lb 6.4 oz (80 kg)  Height: 5\' 5"  (1.651 m)   Body mass index is 29.35 kg/m.  General appearance: alert, no distress, WD/WN,  female HEENT: normocephalic, sclerae anicteric, TMs pearly, nares patent, no  discharge or erythema, pharynx normal Oral cavity: MMM, no lesions Neck: supple, no lymphadenopathy, no thyromegaly, no masses Heart: RRR, normal S1, S2, no murmurs Lungs: CTA bilaterally, no wheezes, rhonchi, or rales Abdomen: +bs, soft, non tender, non distended, no masses, no hepatomegaly, no splenomegaly Musculoskeletal: nontender, no swelling, no obvious deformity Extremities: no edema, no cyanosis, no clubbing Pulses: 2+ symmetric, upper and lower extremities, normal cap refill Neurological: alert, oriented x 3, CN2-12 intact, strength normal upper extremities and lower extremities, sensation normal throughout, DTRs 2+ throughout, no cerebellar signs, gait normal Psychiatric: normal affect,  behavior normal, pleasant    Vicie Mutters, PA-C   01/22/2017

## 2017-01-22 ENCOUNTER — Encounter: Payer: Self-pay | Admitting: Physician Assistant

## 2017-01-22 ENCOUNTER — Encounter: Payer: Self-pay | Admitting: Internal Medicine

## 2017-01-22 ENCOUNTER — Ambulatory Visit (INDEPENDENT_AMBULATORY_CARE_PROVIDER_SITE_OTHER): Payer: Medicare Other | Admitting: Physician Assistant

## 2017-01-22 VITALS — BP 126/74 | HR 80 | Temp 97.5°F | Resp 16 | Ht 65.0 in | Wt 176.4 lb

## 2017-01-22 DIAGNOSIS — I10 Essential (primary) hypertension: Secondary | ICD-10-CM | POA: Diagnosis not present

## 2017-01-22 DIAGNOSIS — E782 Mixed hyperlipidemia: Secondary | ICD-10-CM

## 2017-01-22 DIAGNOSIS — I739 Peripheral vascular disease, unspecified: Secondary | ICD-10-CM | POA: Diagnosis not present

## 2017-01-22 DIAGNOSIS — Z79899 Other long term (current) drug therapy: Secondary | ICD-10-CM

## 2017-01-22 DIAGNOSIS — N183 Chronic kidney disease, stage 3 (moderate): Secondary | ICD-10-CM

## 2017-01-22 DIAGNOSIS — Z23 Encounter for immunization: Secondary | ICD-10-CM | POA: Diagnosis not present

## 2017-01-22 DIAGNOSIS — J438 Other emphysema: Secondary | ICD-10-CM

## 2017-01-22 DIAGNOSIS — E1149 Type 2 diabetes mellitus with other diabetic neurological complication: Secondary | ICD-10-CM

## 2017-01-22 DIAGNOSIS — E1165 Type 2 diabetes mellitus with hyperglycemia: Secondary | ICD-10-CM | POA: Diagnosis not present

## 2017-01-22 DIAGNOSIS — I509 Heart failure, unspecified: Secondary | ICD-10-CM

## 2017-01-22 DIAGNOSIS — E1122 Type 2 diabetes mellitus with diabetic chronic kidney disease: Secondary | ICD-10-CM

## 2017-01-22 DIAGNOSIS — E119 Type 2 diabetes mellitus without complications: Secondary | ICD-10-CM | POA: Diagnosis not present

## 2017-01-22 DIAGNOSIS — Z794 Long term (current) use of insulin: Secondary | ICD-10-CM

## 2017-01-22 DIAGNOSIS — IMO0002 Reserved for concepts with insufficient information to code with codable children: Secondary | ICD-10-CM

## 2017-01-23 LAB — MICROALBUMIN / CREATININE URINE RATIO
CREATININE, URINE: 86 mg/dL (ref 20–275)
MICROALB UR: 224.6 mg/dL
MICROALB/CREAT RATIO: 2612 ug/mg{creat} — AB (ref <30)

## 2017-01-23 LAB — URINALYSIS, ROUTINE W REFLEX MICROSCOPIC
Bacteria, UA: NONE SEEN /HPF
Bilirubin Urine: NEGATIVE
GLUCOSE, UA: NEGATIVE
HGB URINE DIPSTICK: NEGATIVE
KETONES UR: NEGATIVE
LEUKOCYTES UA: NEGATIVE
Nitrite: NEGATIVE
PH: 5.5 (ref 5.0–8.0)
Specific Gravity, Urine: 1.016 (ref 1.001–1.03)

## 2017-01-23 NOTE — Progress Notes (Signed)
Pt aware of lab results & voiced understanding of those results.

## 2017-01-28 ENCOUNTER — Encounter: Payer: Self-pay | Admitting: *Deleted

## 2017-02-01 NOTE — Progress Notes (Addendum)
ID: [name] OB: 12-18-1945  MR#: [id number]  OVF#:[id number]  Patient Care Team: Unk Pinto, MD as PCP - General (Internal Medicine) Rylyn Zawistowski, Virgie Dad, MD as Consulting Physician (Oncology) Kyung Rudd, MD as Consulting Physician (Radiation Oncology) Newt Minion, MD as Consulting Physician (Orthopedic Surgery) Philemon Kingdom, MD as Consulting Physician (Internal Medicine) Josue Hector, MD as Consulting Physician (Cardiology) Inda Castle, MD as Consulting Physician (Gastroenterology) Rolm Bookbinder, MD as Consulting Physician (General Surgery) Calvert Cantor, MD as Consulting Physician (Ophthalmology) Madelon Lips, MD as Consulting Physician (Nephrology) Maisie Fus, MD as Consulting Physician (Obstetrics and Gynecology)  CHIEF COMPLAINT: Estrogen receptor positive early breast cancer   CURRENT TREATMENT: Anastrozole    BREAST CANCER HISTORY:  From the original intake note:   [name] (also goes by [name]) had bilateral screening mammography at [facility] 02/02/2013. There was a 3 mm nodule in the left breast which was further evaluated November 4, confirming an irregular mass at the 1:00 position of the left breast, not confirmed by ultrasonography. Biopsy of this mass 02/10/2013 showed an invasive ductal carcinoma, grade 1, with a prognostic panel pending. Breast MRI has been scheduled for 02/24/2013  The patient's subsequent history is as detailed below  INTERVAL HISTORY: [name] returns today for follow-up and treatment of her estrogen receptor positive breast cancer. She reports that she is doing well overall. She continues on anastrozole, with good tolerance. She denies hot flashes or vaginal dryness at this time, but notes that she has a prior hx of vaginal dryness. She obtains anastrozole at no cost at all.   Since her last visit to the office, she has had a DEXA scan completed at [facility] on 12/25/2016 with results showing: T-score of -0.5 at right femur  neck (normal). She also had a mammogram completed on 12/25/2016 with results that were normal per patient.  She has had a left foot xray ordered by [name] on 08/04/2016 with the most recent assessment being: Healed ulceration left foot with new pre-lesions due to hammertoe on the right foot.   REVIEW OF SYSTEMS: Sidni reports that she completes cardio for her exercise as well as uses a pedometer to aid with tracking steps. She uses a gazelle machine to walk due to her foot issues as well as feeling imbalanced at times. She uses her gazelle daily and completes up to 1-2 miles. She notes that she started having pressure points to her left foot several years ago with prolonged healing. She is followed by Podiatrist, [name]. She is doing well thus far and the pressure points to her left foot are healed at this time. She denies unusual headaches, visual changes, nausea, vomiting, or dizziness. There has been no unusual cough, phlegm production, or pleurisy. This been no change in bowel or bladder habits. She denies unexplained fatigue or unexplained weight loss, bleeding, rash, or fever. A detailed review of systems was otherwise stable.    PAST MEDICAL HISTORY: Past Medical History:  Diagnosis Date  . Allergy   . Arthritis    "maybe in my lower back" (08/27/2012)  . 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 CKD (chronic kidney disease), stage I   . Coronary artery disease    NSTEMI in 05/2009;  LHC with 2 v CAD - > CABG (LIMA-LAD, SVG-diagonal, SVG-OM1/OM 2).   . Family history of anesthesia complication    Mom has a hard time to wake up  . Foot ulcer ([place])    "  I've had them on both feet" (08/27/2012)  . Gouty arthritis    "right index finger" (08/27/2012)  . History of radiation therapy 06/09/13-07/06/13   left breast 50Gy  . Hyperlipidemia   . Hypertension   . Ischemic cardiomyopathy    Echo 07/2009: Mild LVH, EF 40-45%, inferior and posterior  HK, grade 1 diastolic dysfunction, moderate MAC, mild to moderate MR, mild LAE  . Neuropathy    Hx; of B/L feet  . NSTEMI (non-ST elevated myocardial infarction) ([place]) 05/23/2009   Archie Endo 05/23/2009 (08/27/2012)  . Osteomyelitis of foot ([place])   . PAD (peripheral artery disease) ([place])   . Sinus headache    "occasionally" (08/27/2012)  . Type II diabetes mellitus ([place])   . Type II diabetes mellitus with neurological manifestations, uncontrolled ([place]) 02/17/2014  . Type II or unspecified type diabetes mellitus without mention of complication, not stated as uncontrolled   . Vitamin D deficiency     PAST SURGICAL HISTORY: Past Surgical History:  Procedure Laterality Date  . AMPUTATION Right 08/27/2012   Procedure: AMPUTATION RAY;  Surgeon: Newt Minion, MD;  Location: [facility];  Service: Orthopedics;  Laterality: Right;  Right Foot 5th Ray Amputation  . AMPUTATION Left 07/08/2013   Procedure: AMPUTATION RAY;  Surgeon: Newt Minion, MD;  Location: [facility];  Service: Orthopedics;  Laterality: Left;  Left Foot 2nd Ray Amputation  . AMPUTATION RAY Right 08/27/2012   5th ray/notes 08/27/2012  . BREAST LUMPECTOMY  04/20/2013   with biopsy      DR [place]  . BREAST LUMPECTOMY WITH NEEDLE LOCALIZATION AND AXILLARY SENTINEL LYMPH NODE BX Left 04/20/2013   Procedure: LEFT BREAST WIRE GUIDED LUMPECTOMY AND AXILLARY SENTINEL LYMPH NODE BX;  Surgeon: Rolm Bookbinder, MD;  Location: [facility];  Service: General;  Laterality: Left;  . CARDIAC CATHETERIZATION  05/24/2009   Archie Endo 05/24/2009 (08/27/2012)  . CATARACT EXTRACTION W/ INTRAOCULAR LENS  IMPLANT, BILATERAL  2000's  . COLONOSCOPY W/ BIOPSIES AND POLYPECTOMY  2010  . CORONARY ARTERY BYPASS GRAFT  2011   "CABG X4" (08/27/2012)  . DENTAL SURGERY  04/30/11   "1 implant" (08/27/2012)  . DILATION AND CURETTAGE OF UTERUS    . EYE SURGERY    . FINGER SURGERY Right    "index finger; turned out to be gout" (08/27/2012)    FAMILY HISTORY Family History  Problem  Relation Age of Onset  . Kidney cancer Brother 5  . Hypertension Brother   . Breast cancer Sister 79       LCIS; BRCA negative  . Throat cancer Father 22       smoker  . Breast cancer Sister 23       Lobular breast cancer  . Breast cancer Maternal Aunt        dx in her 41s   patient's father died at the age of 26 from throat cancer. The patient's mother died at the age of 23 from "many medical problems". The patient had 4 brothers, 3 sisters. One brother had kidney cancer diagnosed at age 86. One sister was diagnosed with breast cancer at age 78 and died 2012/11/08. A second sister was diagnosed with breast cancer at age 85. There is also one of the patient's mother's 5 sisters diagnosed with breast cancer remotely.  GYNECOLOGIC HISTORY:  Menarche age 54 am first live birth age 54, the patient is [place] P15. (She had a twin pregnancy, [name] and [name], delivered prematurely, with the babies dying after a half day).  She underwent menopause in her late 13s and used hormone replacement for a few months.  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 Son [name] lives in [place] where he works as a Proofreader. The patient has 3 grandchildren. She att[facility] [employer] in [place]: In place   HEALTH MAINTENANCE: Social History  Substance Use Topics  . Smoking status: Never Smoker  . Smokeless tobacco: Never Used  . Alcohol use No     Comment: rarely drinks wine     Colonoscopy: 2010  PAP: 2010  Bone density: 10/21/2007 at [facility], normal  Lipid panel:  Allergies  Allergen Reactions  . Atenolol Other (See Comments)    Exacerbates gout  . Contrast Media [Iodinated Diagnostic Agents] Rash  . Latex Rash  . Omnipaque [Iohexol] Rash    Current Outpatient Prescriptions  Medication Sig Dispense Refill  . Cholecalciferol (VITAMIN D) 2000 units CAPS Take by mouth.    [place] Kitchen  allopurinol (ZYLOPRIM) 100 MG tablet TAKE 1 TABLET TWICE DAILY 180 tablet 1  . anastrozole (ARIMIDEX) 1 MG tablet Take 1 tablet (1 mg total) by mouth daily. 90 tablet 3  . aspirin 325 MG tablet Take 325 mg by mouth at bedtime.     . carvedilol (COREG) 12.5 MG tablet TAKE 1 TABLET TWICE DAILY FOR BLOOD PRESSURE 180 tablet 1  . glucose blood test strip Check sugars 3x a day-DX-E10.22 200 each 11  . hydrALAZINE (APRESOLINE) 50 MG tablet Take 50 mg by mouth 2 (two) times daily.    . hydrochlorothiazide (HYDRODIURIL) 12.5 MG tablet TAKE 1 TABLET EVERY DAY 90 tablet 1  . insulin NPH-regular Human (NOVOLIN 70/30) (70-30) 100 UNIT/ML injection Inject into the skin. Injecting 50 units in the AM and 50 units in the PM.    . Misc. Devices MISC Custom molded inserts for diabetic shoes - ICD10: E11.41. Detailed foot exam performed at the time of the visit on 02/17/2014. Patient has neuropathy, multiple digit amputations and calluses. 2 each 1  . Polyvinyl Alcohol-Povidone (REFRESH OP) Place 1 drop into both eyes daily as needed (dry eyes).    . rosuvastatin (CRESTOR) 20 MG tablet TAKE 1 TABLET EVERY DAY 90 tablet 1   No current facility-administered medications for this visit.     OBJECTIVE: Middle-aged white woman who appears stated age  71:   02/02/17 1150  BP: (!) 176/69  Pulse: 86  Resp: 16  Temp: 98.5 F (36.9 C)  SpO2: 100%     Body mass index is 29.67 kg/m.    ECOG FS:1 - Symptomatic but completely ambulatory  Sclerae unicteric, pupils round and equal Oropharynx clear and moist No cervical or supraclavicular adenopathy Lungs no rales or rhonchi Heart regular rate and rhythm Abd soft, nontender, positive bowel sounds MSK no focal spinal tenderness, no upper extremity lymphedema; slight limp on ambulation Neuro: nonfocal, well oriented, appropriate affect Breasts: The right breast is benign.  The left breast is undergone lumpectomy followed by radiation with no evidence of local  recurrence.  Both axillae are benign.   LAB RESULTS:  CMP     Component Value Date/Time   NA 143 02/02/2017 1137   K 3.8 02/02/2017 1137   CL 107 10/13/2016 1138   CO2 24 02/02/2017 1137   GLUCOSE 74 02/02/2017 1137   BUN 63.4 (H) 02/02/2017 1137   CREATININE 3.0 ([place]) 02/02/2017 1137  CALCIUM 9.9 02/02/2017 1137   PROT 7.1 02/02/2017 1137   ALBUMIN 3.6 02/02/2017 1137   AST 23 02/02/2017 1137   ALT 18 02/02/2017 1137   ALKPHOS 77 02/02/2017 1137   BILITOT 0.31 02/02/2017 1137   GFRNONAA 13 (L) 10/13/2016 1138   GFRAA 15 (L) 10/13/2016 1138    I No results found for: SPEP  Lab Results  Component Value Date   WBC 9.7 02/02/2017   NEUTROABS 6.7 (H) 02/02/2017   HGB 12.3 02/02/2017   HCT 37.4 02/02/2017   MCV 90.6 02/02/2017   PLT 234 02/02/2017      Chemistry      Component Value Date/Time   NA 143 02/02/2017 1137   K 3.8 02/02/2017 1137   CL 107 10/13/2016 1138   CO2 24 02/02/2017 1137   BUN 63.4 (H) 02/02/2017 1137   CREATININE 3.0 (HH) 02/02/2017 1137      Component Value Date/Time   CALCIUM 9.9 02/02/2017 1137   ALKPHOS 77 02/02/2017 1137   AST 23 02/02/2017 1137   ALT 18 02/02/2017 1137   BILITOT 0.31 02/02/2017 1137       No results found for: LABCA2  No components found for: LABCA125  No results for input(s): INR in the last 168 hours.  Urinalysis    Component Value Date/Time   COLORURINE YELLOW 01/22/2017 [place] 01/22/2017 1208   LABSPEC 1.016 01/22/2017 1208   PHURINE 5.5 01/22/2017 [place] 01/22/2017 [place] 01/22/2017 [place] 10/13/2016 1138   [place] 01/22/2017 1208   PROTEINUR 3+ (A) 01/22/2017 1208   UROBILINOGEN 0.2 05/30/2009 1500   NITRITE NEGATIVE 01/22/2017 [place] 01/22/2017 1208    STUDIES: She has had a DEXA scan completed at [facility] September 2018 with results showing: T-score of -0.5 at right femur neck.    ASSESSMENT: 71 y.o. BRCA negative [place] woman status post left breast upper inner quadrant biopsy 02/10/2013 for a clinical T1a N0, stage IA invasive ductal carcinoma, grade 1, with a prognostic panel pending  (1) status post left lumpectomy and sentinel lymph node sampling 04/20/2013 for a pT1a pN0, stage IA invasive ductal carcinoma, grade 1, with negative margins  (2) completed adjuvant radiation 07/06/2013  (3) started anastrozole 09/05/2013;   (a) DEXA scan 07/28/2013 was normal with a T score of -0.3  (b) repeat DEXA scan at [facility] December 25, 2016 showed a T score of -0.5 PLAN: Issis is now nearly 4 years out from definitive surgery for her breast cancer with no evidence of disease recurrence.  This is very favorable.    She continues on anastrozole, with good tolerance.  The plan will be to continue that through June 2020  She unfortunately cannot walk because of her foot problems.  She does have her "gas L" and I have encouraged her to continue to do that.  She is on vitamin D supplementation as well  She knows to call for any other issues that may develop before her next visit here, which will be in 1 year.    Ruvi Fullenwider, Virgie Dad, MD  02/02/17 12:40 PM Medical Oncology and Hematology [facility] [address] [place], [place] [address] Tel. [phone]    Fax. [phone]  This document serves as a record of services personally performed by Lurline Del, MD. It was created on her behalf by Steva Colder, a trained medical scribe. The creation of this record  is based on the scribe's personal observations and the provider's statements to them. This document has been checked and approved by the attending provider.   ADDENDUM: Mammography at [facility] December 25, 2016 found the breast density to be category B.  There was no evidence of malignancy.

## 2017-02-02 ENCOUNTER — Ambulatory Visit (HOSPITAL_BASED_OUTPATIENT_CLINIC_OR_DEPARTMENT_OTHER): Payer: Medicare Other

## 2017-02-02 ENCOUNTER — Ambulatory Visit (HOSPITAL_BASED_OUTPATIENT_CLINIC_OR_DEPARTMENT_OTHER): Payer: Medicare Other | Admitting: Oncology

## 2017-02-02 ENCOUNTER — Telehealth: Payer: Self-pay | Admitting: Oncology

## 2017-02-02 VITALS — BP 176/69 | HR 86 | Temp 98.5°F | Resp 16 | Ht 65.0 in | Wt 178.3 lb

## 2017-02-02 DIAGNOSIS — Z170 Estrogen receptor positive status [ER+]: Secondary | ICD-10-CM

## 2017-02-02 DIAGNOSIS — Z79811 Long term (current) use of aromatase inhibitors: Secondary | ICD-10-CM | POA: Diagnosis not present

## 2017-02-02 DIAGNOSIS — Z923 Personal history of irradiation: Secondary | ICD-10-CM

## 2017-02-02 DIAGNOSIS — C50212 Malignant neoplasm of upper-inner quadrant of left female breast: Secondary | ICD-10-CM

## 2017-02-02 LAB — CBC WITH DIFFERENTIAL/PLATELET
BASO%: 1 % (ref 0.0–2.0)
Basophils Absolute: 0.1 10*3/uL (ref 0.0–0.1)
EOS ABS: 0.3 10*3/uL (ref 0.0–0.5)
EOS%: 2.7 % (ref 0.0–7.0)
HCT: 37.4 % (ref 34.8–46.6)
HEMOGLOBIN: 12.3 g/dL (ref 11.6–15.9)
LYMPH#: 1.8 10*3/uL (ref 0.9–3.3)
LYMPH%: 18.8 % (ref 14.0–49.7)
MCH: 29.8 pg (ref 25.1–34.0)
MCHC: 32.9 g/dL (ref 31.5–36.0)
MCV: 90.6 fL (ref 79.5–101.0)
MONO#: 0.8 10*3/uL (ref 0.1–0.9)
MONO%: 8.3 % (ref 0.0–14.0)
NEUT%: 69.2 % (ref 38.4–76.8)
NEUTROS ABS: 6.7 10*3/uL — AB (ref 1.5–6.5)
Platelets: 234 10*3/uL (ref 145–400)
RBC: 4.13 10*6/uL (ref 3.70–5.45)
RDW: 15.7 % — AB (ref 11.2–14.5)
WBC: 9.7 10*3/uL (ref 3.9–10.3)

## 2017-02-02 LAB — COMPREHENSIVE METABOLIC PANEL
ALBUMIN: 3.6 g/dL (ref 3.5–5.0)
ALK PHOS: 77 U/L (ref 40–150)
ALT: 18 U/L (ref 0–55)
AST: 23 U/L (ref 5–34)
Anion Gap: 13 mEq/L — ABNORMAL HIGH (ref 3–11)
BILIRUBIN TOTAL: 0.31 mg/dL (ref 0.20–1.20)
BUN: 63.4 mg/dL — AB (ref 7.0–26.0)
CO2: 24 mEq/L (ref 22–29)
Calcium: 9.9 mg/dL (ref 8.4–10.4)
Chloride: 106 mEq/L (ref 98–109)
Creatinine: 3 mg/dL (ref 0.6–1.1)
EGFR: 15 mL/min/{1.73_m2} — ABNORMAL LOW (ref >60)
GLUCOSE: 74 mg/dL (ref 70–140)
Potassium: 3.8 mEq/L (ref 3.5–5.1)
SODIUM: 143 meq/L (ref 136–145)
TOTAL PROTEIN: 7.1 g/dL (ref 6.4–8.3)

## 2017-02-02 MED ORDER — ANASTROZOLE 1 MG PO TABS
1.0000 mg | ORAL_TABLET | Freq: Every day | ORAL | 3 refills | Status: DC
Start: 2017-02-02 — End: 2018-02-10

## 2017-02-02 NOTE — Telephone Encounter (Signed)
Gave patient avs and calendar with appts per 10/29 los.

## 2017-02-04 DIAGNOSIS — E669 Obesity, unspecified: Secondary | ICD-10-CM | POA: Diagnosis not present

## 2017-02-04 DIAGNOSIS — K5900 Constipation, unspecified: Secondary | ICD-10-CM | POA: Diagnosis not present

## 2017-02-04 DIAGNOSIS — Z6830 Body mass index (BMI) 30.0-30.9, adult: Secondary | ICD-10-CM | POA: Diagnosis not present

## 2017-02-04 DIAGNOSIS — Z713 Dietary counseling and surveillance: Secondary | ICD-10-CM | POA: Diagnosis not present

## 2017-02-04 DIAGNOSIS — E119 Type 2 diabetes mellitus without complications: Secondary | ICD-10-CM | POA: Diagnosis not present

## 2017-02-17 ENCOUNTER — Telehealth: Payer: Self-pay | Admitting: *Deleted

## 2017-02-17 NOTE — Telephone Encounter (Signed)
Pt states she has some blisters on her feet from wearing new shoes, and one toe is red and the foot is swollen, next appt is 03/03/2017. I informed pt she should cleanse the areas with antibacterial soap, rinse well and cover any broken blisters with antibiotic ointment, wear padded socks and rest so as not to irritate the areas and make an appt to come in tomorrow. I told pt I would inform the schedulers of her need to get in tomorrow and that if she needed she could call first thing in the morning.

## 2017-02-18 ENCOUNTER — Ambulatory Visit (INDEPENDENT_AMBULATORY_CARE_PROVIDER_SITE_OTHER): Payer: Medicare Other

## 2017-02-18 ENCOUNTER — Ambulatory Visit (INDEPENDENT_AMBULATORY_CARE_PROVIDER_SITE_OTHER): Payer: Medicare Other | Admitting: Podiatry

## 2017-02-18 ENCOUNTER — Encounter: Payer: Self-pay | Admitting: Podiatry

## 2017-02-18 VITALS — BP 179/81 | HR 83 | Resp 16

## 2017-02-18 DIAGNOSIS — L089 Local infection of the skin and subcutaneous tissue, unspecified: Secondary | ICD-10-CM

## 2017-02-18 DIAGNOSIS — I2510 Atherosclerotic heart disease of native coronary artery without angina pectoris: Secondary | ICD-10-CM | POA: Diagnosis not present

## 2017-02-18 DIAGNOSIS — S90821A Blister (nonthermal), right foot, initial encounter: Secondary | ICD-10-CM

## 2017-02-18 DIAGNOSIS — L02619 Cutaneous abscess of unspecified foot: Secondary | ICD-10-CM | POA: Diagnosis not present

## 2017-02-18 DIAGNOSIS — L03119 Cellulitis of unspecified part of limb: Secondary | ICD-10-CM

## 2017-02-18 MED ORDER — CIPROFLOXACIN HCL 500 MG PO TABS: 500.0000 mg | ORAL_TABLET | Freq: Two times a day (BID) | ORAL | 0 refills | Status: DC

## 2017-02-18 MED ORDER — CLINDAMYCIN HCL 300 MG PO CAPS: 300.0000 mg | ORAL_CAPSULE | Freq: Three times a day (TID) | ORAL | 0 refills | Status: DC

## 2017-02-18 NOTE — Patient Instructions (Signed)
If any worsening go immediately to the emergency room. Start antibiotics. I have sent clindamycin and ciprofloxacin to your pharmacy. Wear Darco shoe with the wedge. Put a small amount of betadine on the wound daily and cover with gauze.   If you need anything, please give [name] a call at [phone]

## 2017-02-19 LAB — COMPLETE METABOLIC PANEL WITH GFR
AG RATIO: 1.3 (calc) (ref 1.0–2.5)
ALT: 14 U/L (ref 6–29)
AST: 22 U/L (ref 10–35)
Albumin: 3.7 g/dL (ref 3.6–5.1)
Alkaline phosphatase (APISO): 72 U/L (ref 33–130)
BILIRUBIN TOTAL: 0.4 mg/dL (ref 0.2–1.2)
BUN/Creatinine Ratio: 25 (calc) — ABNORMAL HIGH (ref 6–22)
BUN: 69 mg/dL — ABNORMAL HIGH (ref 7–25)
CHLORIDE: 101 mmol/L (ref 98–110)
CO2: 24 mmol/L (ref 20–32)
Calcium: 9.1 mg/dL (ref 8.6–10.4)
Creat: 2.77 mg/dL — ABNORMAL HIGH (ref 0.60–0.93)
GFR, EST AFRICAN AMERICAN: 19 mL/min/{1.73_m2} — AB (ref >=60)
GFR, EST NON AFRICAN AMERICAN: 17 mL/min/{1.73_m2} — AB (ref >=60)
GLOBULIN: 2.8 g/dL (ref 1.9–3.7)
Glucose, Bld: 70 mg/dL (ref 65–139)
POTASSIUM: 3.7 mmol/L (ref 3.5–5.3)
SODIUM: 140 mmol/L (ref 135–146)
Total Protein: 6.5 g/dL (ref 6.1–8.1)

## 2017-02-19 LAB — CBC WITH DIFFERENTIAL/PLATELET
BASOS ABS: 15 {cells}/uL (ref 0–200)
Basophils Relative: 0.1 %
EOS ABS: 178 {cells}/uL (ref 15–500)
Eosinophils Relative: 1.2 %
HCT: 34 % — ABNORMAL LOW (ref 35.0–45.0)
HEMOGLOBIN: 11.6 g/dL — AB (ref 11.7–15.5)
Lymphs Abs: 1421 cells/uL (ref 850–3900)
MCH: 29.7 pg (ref 27.0–33.0)
MCHC: 34.1 g/dL (ref 32.0–36.0)
MCV: 87 fL (ref 80.0–100.0)
MONOS PCT: 8 %
MPV: 11.8 fL (ref 7.5–12.5)
Neutro Abs: 12003 cells/uL — ABNORMAL HIGH (ref 1500–7800)
Neutrophils Relative %: 81.1 %
PLATELETS: 258 10*3/uL (ref 140–400)
RBC: 3.91 10*6/uL (ref 3.80–5.10)
RDW: 14.2 % (ref 11.0–15.0)
TOTAL LYMPHOCYTE: 9.6 %
WBC mixed population: 1184 cells/uL — ABNORMAL HIGH (ref 200–950)
WBC: 14.8 10*3/uL — AB (ref 3.8–10.8)

## 2017-02-19 LAB — SEDIMENTATION RATE: SED RATE: 96 mm/h — AB (ref 0–30)

## 2017-02-19 LAB — C-REACTIVE PROTEIN: CRP: 57 mg/L — AB (ref <8.0)

## 2017-02-19 NOTE — Progress Notes (Signed)
Subjective:    Patient ID: [name], female   DOB: 71 y.o.   MRN: [id number]   HPI patient presents in the last 48 hours she started develop a blister the end of her second digit right and also she has a small keratotic lesion fourth digit right. She's noticed some redness and has some chills but currently her temperature was 99.1. Patient has had previous amputation of the fifth metatarsal by Dr. due to right and [name] did bone work on her left with her had been previous bone infection    ROS      Objective:  Physical Exam neurological sensation significantly diminished bilateral with long-term diabetes not good control with patient noted to have diminished pulses that are present with redness in the right foot noted to the midtarsal joint of the low grade nature with redness of the second toe right and distal opening of the digit with breakdown of tissue and bone exposure. The fourth digit right shows keratotic lesion     Assessment:   Acute cellulitic event right with bone involvement most likely second digit and lesion fourth right      Plan:   I reviewed this patient and x-rays and [name] was in the office and since she's had previous experience with her he did evaluate her. Most likely she is can require at least partial digital amputation with possibility for the entire digit but first were to try to get cellulitic event under control and patient is placed on Cipro and clindamycin. She is given strict instructions that if she should develop further redness or fever she is to go straight to the emergency room and she may need IV antibiotics but hopefully it'll respond oral medication and to be treated in the outpatient setting. Patient is scheduled to see [name] in 1 week and also will wear wedge shoe she has no pressure on the bottom of the forefoot  X-ray indicates there is suspicion of the distal phalanx of the right second toe with fifth metatarsal ray been really  dissected from previous

## 2017-02-23 ENCOUNTER — Ambulatory Visit (INDEPENDENT_AMBULATORY_CARE_PROVIDER_SITE_OTHER): Payer: Medicare Other | Admitting: Podiatry

## 2017-02-23 ENCOUNTER — Encounter: Payer: Self-pay | Admitting: Podiatry

## 2017-02-23 DIAGNOSIS — I2510 Atherosclerotic heart disease of native coronary artery without angina pectoris: Secondary | ICD-10-CM | POA: Diagnosis not present

## 2017-02-23 DIAGNOSIS — S90821A Blister (nonthermal), right foot, initial encounter: Secondary | ICD-10-CM | POA: Diagnosis not present

## 2017-02-23 DIAGNOSIS — L089 Local infection of the skin and subcutaneous tissue, unspecified: Secondary | ICD-10-CM | POA: Diagnosis not present

## 2017-02-23 DIAGNOSIS — M86071 Acute hematogenous osteomyelitis, right ankle and foot: Secondary | ICD-10-CM | POA: Diagnosis not present

## 2017-02-23 DIAGNOSIS — E669 Obesity, unspecified: Secondary | ICD-10-CM | POA: Diagnosis not present

## 2017-02-23 DIAGNOSIS — Z6830 Body mass index (BMI) 30.0-30.9, adult: Secondary | ICD-10-CM | POA: Diagnosis not present

## 2017-02-23 MED ORDER — DOXYCYCLINE HYCLATE 100 MG PO TABS: 100.0000 mg | ORAL_TABLET | Freq: Two times a day (BID) | ORAL | 0 refills | Status: DC

## 2017-02-25 ENCOUNTER — Encounter (HOSPITAL_BASED_OUTPATIENT_CLINIC_OR_DEPARTMENT_OTHER): Payer: Self-pay | Admitting: *Deleted

## 2017-02-25 ENCOUNTER — Other Ambulatory Visit: Payer: Self-pay

## 2017-02-25 ENCOUNTER — Telehealth: Payer: Self-pay | Admitting: *Deleted

## 2017-02-25 NOTE — Progress Notes (Signed)
Subjective: Zelene presents the office today for follow-up evaluation of a wound to the right second toe.  She is to remain on clindamycin and ciprofloxacin and she feels that the redness is much improved.  She states that the toe is looking about the same as it did prior to when she came in last appointment and she feels it is much improved.  She is remained in the [place] wedge shoe to help take pressure off the area she has not had any issues wearing this. Denies any systemic complaints such as fevers, chills, nausea, vomiting. No acute changes since last appointment, and no other complaints at this time.   Objective: AAO x3, NAD DP/PT pulses palpable bilaterally, CRT less than 3 seconds Hammertoe contractures present right second toe.  The distal aspect remains a wound which probes directly down to bone.  There is much improved edema and erythema to the toe and there is no ascending cellulitis.  There is no fluctuance or crepitus.  There is no malodor.  There is a small wound to the dorsal right third toe without any probing, undermining or tunneling. No open lesions or pre-ulcerative lesions.  No pain with calf compression, swelling, warmth, erythema  Assessment: Ulceration with osteomyelitis right second toe with improving erythema  Plan: -All treatment options discussed with the patient including all alternatives, risks, complications.  -At this point discussed with her further treatment options.  The wound still probes down to bone and I recommended amputation of the toe at least partial.  Erythema is much improved to the toe than when I last saw her.  I would like to do a partial to a mutation of able to help decrease the likelihood of worsening bunion, transfer lesions to keep a balance of able she understands there is a risk that she will lose the entire toe given infection or nonhealing.  She would like to hold off on amputation or surgery at this point until after Thanksgiving as her family  is coming into town and the infection is much improved.  We will plan on doing this next Wednesday after the holidays I will see her back in 1 week for further evaluation and surgical consent.  Continue antibiotics but we will switch to doxycycline at this point.  Continue with offloading.  Daily dressing changes. -Patient encouraged to call the office with any questions, concerns, change in symptoms.   *x-ray next appointment   [name] DPM

## 2017-02-25 NOTE — Telephone Encounter (Signed)
"  This is [name] calling from [facility].  We're going to need cardiac clearance before the patient can have surgery here.  I don't know if this will be possible at this time.  I don't know when the patient may have had her last check-up or test with her Cardiologist.  She may have to be moved to the Main OR due to her medical history.

## 2017-02-25 NOTE — Telephone Encounter (Signed)
We received a history and physical form from Dr. Jana Hakim.  I called and informed the patient that we were going to have to move her surgery to [facility] OR due to her health issues as a precautionary measure.  I told her I would call her back once I had it moved.    I called and spoke to [name].  They did not have anytime available on Wednesday so the surgery had to be rescheduled to Thursday 03/05/2017 at [place].  I called and informed the patient of the change in date.  Surgery will be on November 29 at 3:30 pm at the main hospital.  She said she would wait to hear from the pre-op nurse.

## 2017-02-25 NOTE — Progress Notes (Signed)
H&P not available, pt states she has seen Dr. Jana Hakim in the past few weeks and is attempting to have him fill out the form needed. Pts medical history reviewed with Dr. Clide Cliff who states pt will need cardiac clearance as well prior to surgery. Spoke with [facility] and informed her of the need for cardiac clearance prior to surgery.

## 2017-02-25 NOTE — Telephone Encounter (Signed)
"  I'm calling from [facility].  I spoke to the patient.  I know there's some confusion about the history and physical form.  We need to get a history and physical form completed by Dr. Jana Hakim.  She's scheduled for surgery on November 28."  "This is [name].  I was able to reach Dr. Virgie Dad office.  Their fax number is  [phone].  Fax them the surgery forms for my surgery scheduled for Wednesday of next week."  I left [name] from [facility] a message that I went the history and physical lunch this morning prior to lunch time.  I sent another fax of the history and physical form to Dr. Jana Hakim at [phone].

## 2017-02-25 NOTE — Telephone Encounter (Signed)
I'm calling to let you know that [name] wants to do your surgery on Wednesday, November 28.  Will this date be okay with you?  "Well I guess."  It's going to be done at [facility].  They require a history and physical form be filled out by your primary care physician.  Have you seen your doctor within the past 30 days?"  Yes, I have.  I saw Dr. Jana Hakim last month.  I will get him to fill the form out.  I will do best to take care of this.  I don't have mobility, my husband does all the driving.  I don't know if he's able to come by there to get the form or not."  I can fax it to Dr. Jana Hakim.  See if Dr. Jana Hakim will fill the form out and get the fax number for me and I will send it to him.  "I will do the best I can."      I faxed the history and physical form to Dr. Jana Hakim.

## 2017-03-02 ENCOUNTER — Ambulatory Visit (INDEPENDENT_AMBULATORY_CARE_PROVIDER_SITE_OTHER): Payer: Medicare Other | Admitting: Podiatry

## 2017-03-02 ENCOUNTER — Ambulatory Visit (INDEPENDENT_AMBULATORY_CARE_PROVIDER_SITE_OTHER): Payer: Medicare Other

## 2017-03-02 DIAGNOSIS — L97522 Non-pressure chronic ulcer of other part of left foot with fat layer exposed: Secondary | ICD-10-CM

## 2017-03-02 DIAGNOSIS — M86071 Acute hematogenous osteomyelitis, right ankle and foot: Secondary | ICD-10-CM

## 2017-03-02 DIAGNOSIS — I739 Peripheral vascular disease, unspecified: Secondary | ICD-10-CM | POA: Diagnosis not present

## 2017-03-02 DIAGNOSIS — E08621 Diabetes mellitus due to underlying condition with foot ulcer: Secondary | ICD-10-CM | POA: Diagnosis not present

## 2017-03-02 DIAGNOSIS — I2510 Atherosclerotic heart disease of native coronary artery without angina pectoris: Secondary | ICD-10-CM | POA: Diagnosis not present

## 2017-03-02 NOTE — Patient Instructions (Signed)
Pre-Operative Instructions  Congratulations, you have decided to take an important step towards improving your quality of life.  You can be assured that the doctors and staff at [facility] will be with you every step of the way.  Here are some important things you should know:  1. Plan to be at the surgery center/hospital at least 1 (one) hour prior to your scheduled time, unless otherwise directed by the surgical center/hospital staff.  You must have a responsible adult accompany you, remain during the surgery and drive you home.  Make sure you have directions to the surgical center/hospital to ensure you arrive on time. 2. If you are having surgery at [facility] or [facility], you will need a copy of your medical history and physical form from your family physician within one month prior to the date of surgery. We will give you a form for your primary physician to complete.  3. We make every effort to accommodate the date you request for surgery.  However, there are times where surgery dates or times have to be moved.  We will contact you as soon as possible if a change in schedule is required.   4. No aspirin/ibuprofen for one week before surgery.  If you are on aspirin, any non-steroidal anti-inflammatory medications (Mobic, Aleve, Ibuprofen) should not be taken seven (7) days prior to your surgery.  You make take Tylenol for pain prior to surgery.  5. Medications - If you are taking daily heart and blood pressure medications, seizure, reflux, allergy, asthma, anxiety, pain or diabetes medications, make sure you notify the surgery center/hospital before the day of surgery so they can tell you which medications you should take or avoid the day of surgery. 6. No food or drink after midnight the night before surgery unless directed otherwise by surgical center/hospital staff. 7. No alcoholic beverages 24-hours prior to surgery.  No smoking 24-hours prior or 24-hours after  surgery. 8. Wear loose pants or shorts. They should be loose enough to fit over bandages, boots, and casts. 9. Don't wear slip-on shoes. Sneakers are preferred. 10. Bring your boot with you to the surgery center/hospital.  Also bring crutches or a walker if your physician has prescribed it for you.  If you do not have this equipment, it will be provided for you after surgery. 11. If you have not been contacted by the surgery center/hospital by the day before your surgery, call to confirm the date and time of your surgery. 12. Leave-time from work may vary depending on the type of surgery you have.  Appropriate arrangements should be made prior to surgery with your employer. 13. Prescriptions will be provided immediately following surgery by your doctor.  Fill these as soon as possible after surgery and take the medication as directed. Pain medications will not be refilled on weekends and must be approved by the doctor. 14. Remove nail polish on the operative foot and avoid getting pedicures prior to surgery. 15. Wash the night before surgery.  The night before surgery wash the foot and leg well with water and the antibacterial soap provided. Be sure to pay special attention to beneath the toenails and in between the toes.  Wash for at least three (3) minutes. Rinse thoroughly with water and dry well with a towel.  Perform this wash unless told not to do so by your physician.  Enclosed: 1 Ice pack (please put in freezer the night before surgery)   1 Hibiclens skin cleaner     Pre-op instructions  If you have any questions regarding the instructions, please do not hesitate to call our office.  [place]: [address], [place], [place] [address] -- [phone]  [place]: [address] [place], [place] [address] -- [phone]  [place]: [address]  [place], [place] [address] -- [phone]  [place]: [address], [address], [place], [place] [address] -- [phone]  Website:  [email or web address] 

## 2017-03-03 ENCOUNTER — Ambulatory Visit: Payer: Medicare Other | Admitting: Podiatry

## 2017-03-03 NOTE — Progress Notes (Signed)
Subjective: Ms. Omahoney presents the office today for follow-up evaluation of a wound to her right second toe.  She is scheduled for amputation of the digit this week and she presents today for follow-up evaluation prior to amputation, surgery.  She states that she knows that the toe is drying up quite a bit and she has not noticed any pus.  She denies any red streaks.  She is on the doxycycline.  She states that there is a new area of scab on the toe and she thinks this is from a bandage.  She has no new concerns denies any systemic complaints such as fevers, chills, nausea, vomiting. No acute changes since last appointment, and no other complaints at this time.   Objective: AAO x3, NAD DP/PT pulses palpable , CRT less than 3 seconds except for the right second toe which has a wound to the distal aspect of discoloration The distal aspect of right second toe continues to be a wound of the distal aspect which probes directly down to bone.  There is mild malodor today which is new.  Serosanguineous drainage is expressed.  The distal portion of the toe appears to be hardened in nature but there is no significant gangrenous changes.  Just distal to the level of the PIPJ is what appears to be a scabbed area and hardened skin.  There is no extending synovitis and erythema to the foot appears to be resolving there is no swelling. No open lesions or pre-ulcerative lesions.  HAV present as well as digital deformities. No pain with calf compression, swelling, warmth, erythema  Assessment: Osteomyelitis, wound right second toe  Plan: -All treatment options discussed with the patient including all alternatives, risks, complications.  -X-rays were obtained and reviewed.  There is radiolucency at the distal phalanx of the right second toe consistent with osteomyelitis.  There is no soft tissue emphysema present. -We discussed both conservative as well as surgical treatment options.  At this point I recommended  toe amputation.  Given her history of previous fifth ray amputation as well as already having a bunion and hammertoe contractures I would like to try to save at least the base of the proximal phalanx muscle attachments to help try to prevent worsening of the digital deformities like what occurred in the left foot.  There is a chance that she will end up with a total amputation.  Also I did an ABI in the office today which did reveal PAD which is not new for her.  She does have palpable pulses she has good cap refill times the other digits.  Given the infection of her toe at this point I do not think it could make a difference in order to save the digit and I think that that infection needs to be controlled.  Would like to proceed with joint dictation this week by and would order arterial studies.  Discussed with her there is a chance of nonhealing or delayed healing and there is a chance that she may need further surgery or amputation.  She understands this risk and wishes to proceed. -The incision placement as well as the postoperative course was discussed with the patient. I discussed risks of the surgery which include, but not limited to, infection, bleeding, pain, swelling, need for further surgery, delayed or nonhealing, painful or ugly scar, numbness or sensation changes, over/under correction, recurrence, transfer lesions, further deformity, hardware failure, DVT/PE, loss of toe/foot. Patient understands these risks and wishes to proceed with surgery. The  surgical consent was reviewed with the patient all 3 pages were signed. No promises or guarantees were given to the outcome of the procedure. All questions were answered to the best of my ability. Before the surgery the patient was encouraged to call the office if there is any further questions. The surgery will be performed at the [facility] on an outpatient basis. -Patient encouraged to call the office with any questions, concerns, change in symptoms.    [name] DPM

## 2017-03-04 ENCOUNTER — Encounter: Payer: Self-pay | Admitting: *Deleted

## 2017-03-04 ENCOUNTER — Telehealth: Payer: Self-pay | Admitting: *Deleted

## 2017-03-04 ENCOUNTER — Encounter (HOSPITAL_COMMUNITY): Payer: Self-pay | Admitting: Vascular Surgery

## 2017-03-04 NOTE — Telephone Encounter (Signed)
I called patient and asked if she could call her Cardiologist and see if she can get in to see him tomorrow or early Friday morning to get medical clearance.  She said she would call and see what she can do.

## 2017-03-04 NOTE — Telephone Encounter (Signed)
"  [name] is going to need medical clearance from her Cardiologist before she can have surgery due to her health issues.  Dr. Conrad Sherrill, the anesthesiologist, said she will need it before surgery can be performed.  I called the patient and she said it's been over two years since she saw him last."  I'll let [name] know.

## 2017-03-04 NOTE — Telephone Encounter (Signed)
"  I'm returning your call.  I was able to get an appointment with a PA on tomorrow.  Her name is Rosaria Ferries and she works at [employer].  They said they need [name] to send chart notes so they can be informed of what's going on.  Their fax number is [phone]."  Okay, I'll fax the notes.    Notes were faxed to [place].  I called and rescheduled surgery to Friday at 4pm at [place].  (Dr. Gwenlyn Found can disregard the appointment request letter that was sent to him.)

## 2017-03-04 NOTE — Telephone Encounter (Signed)
"  Could you please get [name] to put in the orders for Friday?"  I will let him know.

## 2017-03-04 NOTE — Telephone Encounter (Signed)
"  Hi this is [name] again.  What did [name] decide?"  We rescheduled her surgery to Friday.  She's going to see a PA tomorrow at [facility].

## 2017-03-04 NOTE — Progress Notes (Signed)
Anesthesia Chart Review:  SAME DAY WORK-UP.  Patient is a 71 year old female scheduled for right 2nd toe interthalangeal amputation on 03/05/17 by Celesta Gentile, DPM. Anesthesia type is posted for MAC. H&P completed by Dr. Jana Hakim on 02/02/17.  Procedure was initially scheduled at [facility]. According to Glenford Peers, RN notation, history was reviewed with anesthesiologist Dr. Lyn Hollingshead who recommended patient get cardiac clearance. Case was later moved to [facility] OR, but without cardiac clearance. I confirmed with anesthesiologist Dr. Lillia Abed that patient would still require cardiac clearance (history of CAD/CABG, ischemic CM, LBBB, no cardiac f/u since 2014). PAT RN Sherlynn Stalls notified Delydia at Dr. Leigh Aurora office. She was given Dr. Everlene Other work phone number in case [name] wished to speak with him further.   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   - PCP is Dr. Unk Pinto. Last visit 01/22/17 with Vicie Mutters, PA-C. They are managing patient's DM currently as patient has not seen endocrinologist Dr. Philemon Kingdom since 02/17/14. - Cardiologist is Dr. Jenkins Rouge, last visit 03/10/13 when seen for surgical clearance prior to breast surgery.  - HEM-ONC is Dr. Lurline Del, last visit 02/02/17. One year follow-up recommended.  - Nephrologist is Dr. Madelon Lips, last visit 12/31/16 (scanned under Media tab).  Meds include allopurinol, Arimidex, aspirin 325 mg (on hold), Excedrin Migraine, Coreg, doxycycline, hydralazine, HCTZ, Novolin 70/30, Crestor.  EKG 07/01/16: SR, LAD, left BBB. She has had LAD and LBBB since at  least 2012. She had had intermittently negative T wave in V5-6 present since then.    Echo 07/12/09:  Normal LV size with mild LV hypertrophy. Mild to moderate systolic dysfunction, EF 76-16%. Inferior and posterior hypokinesis. Mild to moderate mitral regurgitation. Mild left atrial enlargement. Normal RV size and systolic function.  Her last cath was pre-CABG in 2011.  Last labs on 02/18/17 showed BUN 69, Cr 2.77 (stable appearing), glucose 70, WBC 14.8, H/H 11.6/34.0, PLT 258.  As above, cardiac clearance recommended preoperatively. Delydia to discuss with [name] and patient.   George [facility] Short Stay Center/Anesthesiology Phone [phone] 03/04/2017 2:34 PM

## 2017-03-04 NOTE — Telephone Encounter (Signed)
done

## 2017-03-05 ENCOUNTER — Telehealth: Payer: Self-pay

## 2017-03-05 ENCOUNTER — Ambulatory Visit (INDEPENDENT_AMBULATORY_CARE_PROVIDER_SITE_OTHER): Payer: Medicare Other | Admitting: Physician Assistant

## 2017-03-05 ENCOUNTER — Encounter (HOSPITAL_COMMUNITY): Payer: Self-pay | Admitting: *Deleted

## 2017-03-05 ENCOUNTER — Other Ambulatory Visit: Payer: Self-pay

## 2017-03-05 ENCOUNTER — Encounter: Payer: Self-pay | Admitting: Physician Assistant

## 2017-03-05 ENCOUNTER — Telehealth: Payer: Self-pay | Admitting: *Deleted

## 2017-03-05 VITALS — BP 170/82 | HR 83 | Ht 65.0 in | Wt 170.4 lb

## 2017-03-05 DIAGNOSIS — Z01810 Encounter for preprocedural cardiovascular examination: Secondary | ICD-10-CM | POA: Diagnosis not present

## 2017-03-05 DIAGNOSIS — I739 Peripheral vascular disease, unspecified: Secondary | ICD-10-CM | POA: Diagnosis not present

## 2017-03-05 DIAGNOSIS — I2510 Atherosclerotic heart disease of native coronary artery without angina pectoris: Secondary | ICD-10-CM | POA: Diagnosis not present

## 2017-03-05 DIAGNOSIS — I10 Essential (primary) hypertension: Secondary | ICD-10-CM | POA: Diagnosis not present

## 2017-03-05 DIAGNOSIS — Z01818 Encounter for other preprocedural examination: Secondary | ICD-10-CM

## 2017-03-05 NOTE — Progress Notes (Signed)
Pt was seen by Rosaria Ferries, PA today and was cleared for toe amputation.

## 2017-03-05 NOTE — Progress Notes (Signed)
Cardiology Office Note   Date:  03/05/2017   ID:  [name], DOB 1946-01-17, MRN [id number]  PCP:  Unk Pinto, MD  Cardiologist:  Dr Johnsie Cancel, 03/2013  Rosaria Ferries, PA-C   Chief Complaint  Patient presents with  . Medical Clearance    amputation of toe    History of Present Illness: [name] is a 71 y.o. female with a history of NSTEMI in 05/2009, DM2, HTN, HL, PAD. LHC at MI w/ severe two-vessel CAD and an ejection fraction of 45%. s/p CABG (LIMA-LAD, SVG-diagonal, SVG-OM1/OM2). Echo 07/2009: Mild LVH, EF 40-45%, inferior and posterior HK, grade 1 diastolic dysfunction, moderate MAC, mild to moderate MR, mild LAE, PAD w/ chronically occluded L SFA, breast CA 2014, foot ulcer and infection 02/14/2017>>still being treated, LBBB  [name] presents for cardiology follow up and preop eval  She needs the tip of her R 2nd toe amputated.   She is not very active due to her back, and now her foot. She does not walk very well. Her stamina is poor, she has felt this was due to deconditioning. She has a chair that helps her go up and down stairs. She has a home pedaler she can use sitting in a chair, is not very consistent. She does not do any strenuous cleaning.   She has joined [facility], her weight has not changed, but her CBG is improved.   PCP follows her cholesterol.   She has not had any chest pain, even with exertion. She has not felt that her DOE is out of line with her (admitted) deconditioning.   She tends to have hammer toes and that is how she gets the ulcers.  She has not had leg pain or numbness. No limitations on ambulation due to claudication symptoms. She has not followed up with Dr Bridgett Larsson since 2013.   She is aware that her BP is high. Dr Hollie Salk is managing her BP meds. She was recently started on Hydralazine.  Dr. Bishop Dublin blood pressure goal for her is 130/80.    Past Medical History:  Diagnosis Date  . Allergy   . Arthritis    "maybe in my lower back" (08/27/2012)  . 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 CKD (chronic kidney disease), stage I   . Coronary artery disease    NSTEMI in 05/2009;  LHC with 2 v CAD - > CABG (LIMA-LAD, SVG-diagonal, SVG-OM1/OM 2).   . Family history of anesthesia complication    Mom has a hard time to wake up  . Foot ulcer ([place])    "I've had them on both feet" (08/27/2012)  . Gouty arthritis    "right index finger" (08/27/2012)  . History of radiation therapy 06/09/13-07/06/13   left breast 50Gy  . Hyperlipidemia   . Hypertension   . Ischemic cardiomyopathy    Echo 07/2009: Mild LVH, EF 40-45%, inferior and posterior HK, grade 1 diastolic dysfunction, moderate MAC, mild to moderate MR, mild LAE  . Neuropathy    Hx; of B/L feet  . NSTEMI (non-ST elevated myocardial infarction) ([place]) 05/23/2009   Archie Endo 05/23/2009 (08/27/2012)  . Osteomyelitis of foot ([place])   . PAD (peripheral artery disease) ([facility])    bilateral legs  . Sinus headache    "occasionally" (08/27/2012)  . Type II diabetes mellitus ([place])   . Type II diabetes mellitus with neurological manifestations, uncontrolled ([place]) 02/17/2014  . Type II or unspecified type diabetes mellitus  without mention of complication, not stated as uncontrolled   . Vitamin D deficiency     Past Surgical History:  Procedure Laterality Date  . AMPUTATION Right 08/27/2012   Procedure: AMPUTATION RAY;  Surgeon: Newt Minion, MD;  Location: [facility];  Service: Orthopedics;  Laterality: Right;  Right Foot 5th Ray Amputation  . AMPUTATION Left 07/08/2013   Procedure: AMPUTATION RAY;  Surgeon: Newt Minion, MD;  Location: [facility];  Service: Orthopedics;  Laterality: Left;  Left Foot 2nd Ray Amputation  . AMPUTATION RAY Right 08/27/2012   5th ray/notes 08/27/2012  . BREAST LUMPECTOMY  04/20/2013   with biopsy      DR WAKEFIELD  . BREAST LUMPECTOMY WITH NEEDLE LOCALIZATION AND AXILLARY SENTINEL LYMPH NODE BX Left  04/20/2013   Procedure: LEFT BREAST WIRE GUIDED LUMPECTOMY AND AXILLARY SENTINEL LYMPH NODE BX;  Surgeon: Rolm Bookbinder, MD;  Location: [facility];  Service: General;  Laterality: Left;  . CARDIAC CATHETERIZATION  05/24/2009   Archie Endo 05/24/2009 (08/27/2012)  . CATARACT EXTRACTION W/ INTRAOCULAR LENS  IMPLANT, BILATERAL  2000's  . COLONOSCOPY W/ BIOPSIES AND POLYPECTOMY  2010  . CORONARY ARTERY BYPASS GRAFT  2011   "CABG X4" (08/27/2012)  . DENTAL SURGERY  04/30/11   "1 implant" (08/27/2012)  . DILATION AND CURETTAGE OF UTERUS    . EYE SURGERY    . FINGER SURGERY Right    "index finger; turned out to be gout" (08/27/2012)    Current Outpatient Medications  Medication Sig Dispense Refill  . allopurinol (ZYLOPRIM) 100 MG tablet TAKE 1 TABLET TWICE DAILY 180 tablet 1  . anastrozole (ARIMIDEX) 1 MG tablet Take 1 tablet (1 mg total) by mouth daily. 90 tablet 3  . aspirin 325 MG tablet Take 325 mg by mouth at bedtime.     Marland Kitchen aspirin-acetaminophen-caffeine (EXCEDRIN MIGRAINE) 250-250-65 MG tablet Take 1 tablet by mouth daily as needed (for sinus headaches.).    Marland Kitchen beta carotene w/minerals (OCUVITE) tablet Take 1 tablet by mouth daily.    . carvedilol (COREG) 12.5 MG tablet TAKE 1 TABLET TWICE DAILY FOR BLOOD PRESSURE 180 tablet 1  . Cholecalciferol (VITAMIN D3) 2000 units TABS Take 2,000 Units by mouth daily. On hold due to upcoming procedure    . ciprofloxacin (CIPRO) 500 MG tablet Take 1 tablet (500 mg total) 2 (two) times daily by mouth. 20 tablet 0  . docusate sodium (COLACE) 100 MG capsule Take 100 mg by mouth at bedtime.    Marland Kitchen doxycycline (VIBRA-TABS) 100 MG tablet Take 1 tablet (100 mg total) 2 (two) times daily by mouth. 20 tablet 0  . hydrALAZINE (APRESOLINE) 50 MG tablet Take 50 mg by mouth 2 (two) times daily.    . hydrochlorothiazide (HYDRODIURIL) 12.5 MG tablet TAKE 1 TABLET EVERY DAY 90 tablet 1  . insulin NPH-regular Human (NOVOLIN 70/30) (70-30) 100 UNIT/ML injection Inject 30-65 Units into  the skin 2 (two) times daily. Use 60-65 units in the morning & 30-35 units in the evening (varies based on blood sugar)    . Polyethyl Glycol-Propyl Glycol (LUBRICANT EYE DROPS) 0.4-0.3 % SOLN Place 1-2 drops into both eyes 3 (three) times daily as needed (for dry eyes.).    Marland Kitchen rosuvastatin (CRESTOR) 20 MG tablet TAKE 1 TABLET EVERY DAY (Patient taking differently: TAKE 1 TABLET (20 MG) EVERY DAY IN THE EVENING.) 90 tablet 1   No current facility-administered medications for this visit.     Allergies:   Atenolol; Contrast media [iodinated diagnostic agents]; Latex;  and Omnipaque [iohexol]    Social History:  The patient  reports that  has never smoked. she has never used smokeless tobacco. She reports that she does not drink alcohol or use drugs.   Family History:  The patient's family history includes Breast cancer in her maternal aunt; Breast cancer (age of onset: 51) in her sister; Breast cancer (age of onset: 34) in her sister; Hypertension in her brother; Kidney cancer (age of onset: 34) in her brother; Throat cancer (age of onset: 68) in her father.    ROS:  Please see the history of present illness. All other systems are reviewed and negative.    PHYSICAL EXAM: VS:  BP (!) 170/82 (BP Location: Right Arm)   Pulse 83   Ht 5' 5"  (1.651 m)   Wt 170 lb 6.4 oz (77.3 kg)   BMI 28.36 kg/m  , BMI Body mass index is 28.36 kg/m. GEN: Well nourished, well developed, female in no acute distress  HEENT: normal for age  Neck: no JVD, no carotid bruit, no masses Cardiac: RRR; soft murmur, no rubs, or gallops Respiratory:  clear to auscultation bilaterally, normal work of breathing GI: soft, nontender, nondistended, + BS MS: no deformity or atrophy; no edema; distal pulses are 2+ in upper extremities, decreased pulses in both lower extremities, capillary refill is normal on the left and delayed on the right, bilateral femoral bruits appreciated Skin: warm and dry, no rash Neuro:  Strength and  sensation are intact Psych: euthymic mood, full affect   EKG:  EKG is ordered today. The ekg ordered today demonstrates sinus rhythm, HR 83,left axis deviation and left bundle branch block is old.   Recent Labs: 10/13/2016: Magnesium 2.4; TSH 2.32 02/18/2017: ALT 14; BUN 69; Creat 2.77; Hemoglobin 11.6; Platelets 258; Potassium 3.7; Sodium 140    Lipid Panel    Component Value Date/Time   CHOL 137 10/13/2016 1138   TRIG 210 (H) 10/13/2016 1138   HDL 32 (L) 10/13/2016 1138   CHOLHDL 4.3 10/13/2016 1138   VLDL 42 (H) 10/13/2016 1138   LDLCALC 63 10/13/2016 1138     Wt Readings from Last 3 Encounters:  03/05/17 170 lb 6.4 oz (77.3 kg)  02/02/17 178 lb 4.8 oz (80.9 kg)  01/22/17 176 lb 6.4 oz (80 kg)     Other studies Reviewed: Additional studies/ records that were reviewed today include: Office notes, hospital records and testing.  ASSESSMENT AND PLAN: The patient and plan were reviewed with Dr. [name], who agrees.  1.  Preop evaluation: Her activity level is poor, but she is not having any ischemic symptoms.  Her EF has been just below normal in the past.  We will check an echocardiogram, but this does not need to be completed prior to her surgery.  Because of her history of coronary artery disease, she is at increased but acceptable risk for the planned procedure.  I do not believe the echocardiogram results will change this.  Okay to go ahead and have the procedure.  2.  PAD: She has not had ABIs since 2013.  Lower extremity pulses.  We will recheck ABIs and have her follow-up with Dr Johnsie Cancel.   3.  Hypertension: This is being followed closely by Dr. Hollie Salk.  She has been recently started on hydralazine in addition to her other medications. I will leave this to Dr. Hollie Salk.  4.  CAD: She has not had an ischemic evaluation since her bypass surgery in 2011.  She is not  currently having any ischemic symptoms.  We will check an echocardiogram, follow-up on these results.  If her EF is  decreased from previous, consider ischemic eval.   Current medicines are reviewed at length with the patient today.  The patient does not have concerns regarding medicines.  The following changes have been made:  no change  Labs/ tests ordered today include:   Orders Placed This Encounter  Procedures  . EKG 12-Lead     Disposition:   FU with Dr. Johnsie Cancel in 2 months  Signed, Rosaria Ferries, PA-C  03/05/2017 11:36 AM    [place] Phone: [phone]; Fax: [phone]  This note was written with the assistance of speech recognition software. Please excuse any transcriptional errors.

## 2017-03-05 NOTE — Telephone Encounter (Signed)
   [facility] Pre-operative Risk Assessment    Request for surgical clearance: LOV NISHAN 03-10-2013  MESSAGE SENT TO SCHEDULING TO CALL AND MAKE APPT-NOTIFIED [facility] THAT THEY WILL NEED TO RESCHEDULE SURGERY  1. What type of surgery is being performed?  AMPUTATION TOE INTERTHALANGEAL 2ND RIGHT  2. When is this surgery scheduled?  03-06-2017 NOTIFIED/SPOKE WITH [place], [place] J @ [facility]-MAY HAVE TO RESCHEDULE  3. Are there any medications that need to be held prior to surgery and how long?  NONE LISTED  4. Practice name and name of physician performing surgery? [facility] Jacqualyn Posey  5. What is your office phone and fax number? [phone]  6. Anesthesia type (None, local, MAC, general) ? NOT LISTED    Waylan Rocher 03/05/2017, 9:56 AM  _________________________________________________________________   (provider comments below)  PER Epic NOTE:[place], Delydia J([facility]): "I am calling regarding [name].  We cannot give Cardiac Clearance without going through the process.  I see you have her scheduled for surgery tomorrow.  If you do it, it will be at your own risk.  We have not seen her in two years.  We understand she needs an amputation, so we will try to speed up the process.  We will let you know when everything is taken care for the cardiac review."  I'll let [name] know.  I called and canceled patient's surgery.  I spoke to [facility] and informed her the cancellation was due to patient needing cardiac clearance.  I called and informed the patient.  03/05/17 10:01 AM  Unsigned Note    I informed that they may have to cancel surgery tomorrow d/t pt has not been seen in 4 years.

## 2017-03-05 NOTE — Telephone Encounter (Signed)
"  I am calling to let you know that the patient had concerns about being Diabetic and not eating for such a long time.  I spoke with the Anesthesiologist and he said it was okay for her to eat breakfast as long as [name] was okay with it.  He said [name] will not be able to move the patient to an earlier time if he starts to finish up with other cases.  The surgery will have to remain at 4 pm."   Okay, I will let [name] know.  "Thank you so much."

## 2017-03-05 NOTE — Progress Notes (Signed)
Anesthesia Chart Review:  Patient is a same day work up   Patient is a 71 year old female scheduled for a right second toe interphalangeal amputation on 03/06/2017 with Ricard Dillon, DPM  Please see Allyn Kenner note dated 03/04/17 for complete details.  Pt was asked by anesthesiology to be seen by cardiology prior to surgery.     Pt saw Rosaria Ferries, PA with cardiology today 03/05/17 and was cleared to proceed with surgery at increased but acceptable risk.  Echo ordered for the future, does not need to be done prior to surgery.   Labs will be obtained day of surgery.  - Last labs on 02/18/17 showed BUN 69, Cr 2.77 (stable appearing), glucose 70, WBC 14.8, H/H 11.6/34.0, PLT 258.  If day of surgery labs acceptable, I anticipate pt can proceed with surgery as scheduled.   Willeen Cass, FNP-BC [facility]/Anesthesiology Phone: [phone] 03/05/2017 4:18 PM

## 2017-03-05 NOTE — Telephone Encounter (Signed)
"  I am calling regarding [name].  We cannot give Cardiac Clearance without going through the process.  I see you have her scheduled for surgery tomorrow.  If you do it, it will be at your own risk.  We have not seen her in two years.  We understand she needs an amputation, so we will try to speed up the process.  We will let you know when everything is taken care for the cardiac review."  I'll let [name] know.  I called and canceled patient's surgery.  I spoke to [name] and informed her the cancellation was due to patient needing cardiac clearance.  I called and informed the patient.  She said she understood.

## 2017-03-05 NOTE — Telephone Encounter (Signed)
"  I have just returned from my Cardiology visit.  To my knowledge, they have cleared me for my surgery.  I think you need to fax them some paperwork to okay it officially.  I wanted to let you know the fax number to [facility] is [phone] or [phone].  I saw the PA- Suanne Marker Barrett today."  I called and spoke to Veronda Prude about clearance for patient to have surgery.  She said that Pamela Ferries, PA , okayed for the patient to have the surgery.  She said [name] said she would finish dictating the note and would forward it to [name] once it's complete.  "I'm calling to see if you got my earlier message about getting the clearance for my surgery."  Yes, I did get it and you are back on the schedule for tomorrow.  "Where am I going and at what time?"  Your surgery is at 4 pm at the Main OR.  I think they want you there two hours earlier.  "I'm [name], how am I to go without food for so long?"  Someone from the surgical center should give you a call shortly with your instructions.  "Okay, I will wait on their call.  Are you post-op visits still the same?"  Your post-op visit here is scheduled for Monday at 2 pm.  You will get your post-op instructions after your surgery tomorrow.  "Okay thank you so much.  [name] if I don't get to see you."  I called and left Sherlynn Stalls in pre-admission a message that the patient has cardiac clearance now.  She's scheduled for surgery on tomorrow.

## 2017-03-05 NOTE — Progress Notes (Signed)
Pt denies any acute cardiopulmonary issues. Pt under the care of Dr. Johnsie Cancel, Cardiology. Pt denies chest x ray within the last year. Pt made aware to stop taking vitamins, fish oil and herbal medications. Do not take any NSAIDs ie: Ibuprofen, Advil, Naproxen (Aleve), Motrin, Excedrin Migraine, BC and Goody Powder. Pt stated that she was instructed to stop taking Aspirin. Pt made aware to take between 21-24 units of Novolin NPH tonight (depending on ss) and No insulin DOS. Pt made aware to check BG every 2 hours prior to arrival to hospital on DOS. Pt made aware to treat a BG < 70 with 4 ounces of cranberry juice, wait 15 minutes after intervention to recheck BG, if BG remains < 70, call Short Stay unit to speak with a nurse. Per Dr. Conrad [place], Anesthesia, pt made aware to eat a light breakfast (no high fat, fried foods) and be completed by 7:00am and can have clear liquids up until 12:00 noon. Pt verbalized understanding of all pre-op instructions.

## 2017-03-05 NOTE — Patient Instructions (Addendum)
Your physician recommends that you continue on your current medications as directed. Please refer to the Current Medication list given to you today.  Your physician has requested that you have an echocardiogram. Echocardiography is a painless test that uses sound waves to create images of your heart. It provides your doctor with information about the size and shape of your heart and how well your heart's chambers and valves are working. This procedure takes approximately one hour. There are no restrictions for this procedure.   Your physician has requested that you have a lower or upper extremity arterial duplex. This test is an ultrasound of the arteries in the legs or arms. It looks at arterial blood flow in the legs and arms. Allow one hour for Lower and Upper Arterial scans. There are no restrictions or special instructions  LOWER  Your physician recommends that you schedule a follow-up appointment in: [place] Johnsie Cancel

## 2017-03-06 ENCOUNTER — Ambulatory Visit (HOSPITAL_COMMUNITY)
Admission: RE | Admit: 2017-03-06 | Discharge: 2017-03-06 | Disposition: A | Discharge status: Home / Self Care | Payer: Medicare Other | Source: Ambulatory Visit | Attending: Podiatry | Admitting: Podiatry

## 2017-03-06 ENCOUNTER — Encounter (HOSPITAL_COMMUNITY): Payer: Self-pay | Admitting: Certified Registered Nurse Anesthetist

## 2017-03-06 ENCOUNTER — Ambulatory Visit (HOSPITAL_COMMUNITY): Payer: Medicare Other | Admitting: Emergency Medicine

## 2017-03-06 ENCOUNTER — Encounter: Payer: Medicare Other | Admitting: Podiatry

## 2017-03-06 ENCOUNTER — Encounter (HOSPITAL_COMMUNITY)
Admission: RE | Disposition: A | Discharge status: Home / Self Care | Payer: Self-pay | Source: Ambulatory Visit | Attending: Podiatry

## 2017-03-06 ENCOUNTER — Encounter: Payer: Self-pay | Admitting: Podiatry

## 2017-03-06 ENCOUNTER — Encounter (HOSPITAL_COMMUNITY): Admission: RE | Payer: Self-pay | Source: Ambulatory Visit

## 2017-03-06 ENCOUNTER — Ambulatory Visit (HOSPITAL_COMMUNITY): Payer: Medicare Other

## 2017-03-06 ENCOUNTER — Ambulatory Visit (HOSPITAL_COMMUNITY): Admission: RE | Admit: 2017-03-06 | Payer: Medicare Other | Source: Ambulatory Visit | Admitting: Podiatry

## 2017-03-06 DIAGNOSIS — Z7951 Long term (current) use of inhaled steroids: Secondary | ICD-10-CM | POA: Diagnosis not present

## 2017-03-06 DIAGNOSIS — N289 Disorder of kidney and ureter, unspecified: Secondary | ICD-10-CM | POA: Diagnosis not present

## 2017-03-06 DIAGNOSIS — M869 Osteomyelitis, unspecified: Secondary | ICD-10-CM | POA: Insufficient documentation

## 2017-03-06 DIAGNOSIS — Z794 Long term (current) use of insulin: Secondary | ICD-10-CM | POA: Diagnosis not present

## 2017-03-06 DIAGNOSIS — I130 Hypertensive heart and chronic kidney disease with heart failure and stage 1 through stage 4 chronic kidney disease, or unspecified chronic kidney disease: Secondary | ICD-10-CM | POA: Diagnosis not present

## 2017-03-06 DIAGNOSIS — Z899 Acquired absence of limb, unspecified: Secondary | ICD-10-CM

## 2017-03-06 DIAGNOSIS — I509 Heart failure, unspecified: Secondary | ICD-10-CM | POA: Insufficient documentation

## 2017-03-06 DIAGNOSIS — M19071 Primary osteoarthritis, right ankle and foot: Secondary | ICD-10-CM | POA: Diagnosis not present

## 2017-03-06 DIAGNOSIS — J449 Chronic obstructive pulmonary disease, unspecified: Secondary | ICD-10-CM | POA: Insufficient documentation

## 2017-03-06 DIAGNOSIS — E119 Type 2 diabetes mellitus without complications: Secondary | ICD-10-CM | POA: Insufficient documentation

## 2017-03-06 DIAGNOSIS — I110 Hypertensive heart disease with heart failure: Secondary | ICD-10-CM | POA: Diagnosis not present

## 2017-03-06 DIAGNOSIS — M868X7 Other osteomyelitis, ankle and foot: Secondary | ICD-10-CM | POA: Diagnosis not present

## 2017-03-06 DIAGNOSIS — R0602 Shortness of breath: Secondary | ICD-10-CM | POA: Insufficient documentation

## 2017-03-06 DIAGNOSIS — I252 Old myocardial infarction: Secondary | ICD-10-CM | POA: Insufficient documentation

## 2017-03-06 DIAGNOSIS — M2041 Other hammer toe(s) (acquired), right foot: Secondary | ICD-10-CM | POA: Diagnosis not present

## 2017-03-06 DIAGNOSIS — M86172 Other acute osteomyelitis, left ankle and foot: Secondary | ICD-10-CM

## 2017-03-06 DIAGNOSIS — N183 Chronic kidney disease, stage 3 (moderate): Secondary | ICD-10-CM | POA: Diagnosis not present

## 2017-03-06 DIAGNOSIS — M86671 Other chronic osteomyelitis, right ankle and foot: Secondary | ICD-10-CM | POA: Diagnosis not present

## 2017-03-06 DIAGNOSIS — I739 Peripheral vascular disease, unspecified: Secondary | ICD-10-CM | POA: Insufficient documentation

## 2017-03-06 DIAGNOSIS — I2510 Atherosclerotic heart disease of native coronary artery without angina pectoris: Secondary | ICD-10-CM | POA: Insufficient documentation

## 2017-03-06 HISTORY — DX: Unspecified infectious disease: B99.9

## 2017-03-06 HISTORY — DX: Nausea with vomiting, unspecified: R11.2

## 2017-03-06 HISTORY — DX: Other specified postprocedural states: Z98.890

## 2017-03-06 HISTORY — PX: AMPUTATION TOE: SHX6595

## 2017-03-06 LAB — COMPREHENSIVE METABOLIC PANEL
ALBUMIN: 3.7 g/dL (ref 3.5–5.0)
ALK PHOS: 76 U/L (ref 38–126)
ALT: 18 U/L (ref 14–54)
ANION GAP: 12 (ref 5–15)
AST: 24 U/L (ref 15–41)
BILIRUBIN TOTAL: 0.5 mg/dL (ref 0.3–1.2)
BUN: 73 mg/dL — ABNORMAL HIGH (ref 6–20)
CALCIUM: 9.8 mg/dL (ref 8.9–10.3)
CO2: 23 mmol/L (ref 22–32)
CREATININE: 2.99 mg/dL — AB (ref 0.44–1.00)
Chloride: 104 mmol/L (ref 101–111)
GFR calc Af Amer: 17 mL/min — ABNORMAL LOW (ref >60)
GFR calc non Af Amer: 15 mL/min — ABNORMAL LOW (ref >60)
GLUCOSE: 140 mg/dL — AB (ref 65–99)
Potassium: 3.8 mmol/L (ref 3.5–5.1)
Sodium: 139 mmol/L (ref 135–145)
TOTAL PROTEIN: 7.1 g/dL (ref 6.5–8.1)

## 2017-03-06 LAB — GLUCOSE, CAPILLARY
Glucose-Capillary: 139 mg/dL — ABNORMAL HIGH (ref 65–99)
Glucose-Capillary: 139 mg/dL — ABNORMAL HIGH (ref 65–99)

## 2017-03-06 LAB — CBC
HEMATOCRIT: 41 % (ref 36.0–46.0)
HEMOGLOBIN: 13.1 g/dL (ref 12.0–15.0)
MCH: 29.1 pg (ref 26.0–34.0)
MCHC: 32 g/dL (ref 30.0–36.0)
MCV: 91.1 fL (ref 78.0–100.0)
Platelets: 258 10*3/uL (ref 150–400)
RBC: 4.5 MIL/uL (ref 3.87–5.11)
RDW: 15.3 % (ref 11.5–15.5)
WBC: 10.1 10*3/uL (ref 4.0–10.5)

## 2017-03-06 SURGERY — AMPUTATION, TOE
Anesthesia: Monitor Anesthesia Care | Laterality: Right

## 2017-03-06 MED ORDER — CEFAZOLIN SODIUM-DEXTROSE 2-4 GM/100ML-% IV SOLN
2.0000 g | INTRAVENOUS | Status: AC
  Administered 2017-03-06: 2 g via INTRAVENOUS

## 2017-03-06 MED ORDER — HYDROCODONE-ACETAMINOPHEN 5-325 MG PO TABS: 1.0000 | ORAL_TABLET | Freq: Four times a day (QID) | ORAL | 0 refills | Status: DC | PRN

## 2017-03-06 MED ORDER — MIDAZOLAM HCL 5 MG/5ML IJ SOLN
INTRAMUSCULAR | Status: DC | PRN
  Administered 2017-03-06: 2 mg via INTRAVENOUS

## 2017-03-06 MED ORDER — LIDOCAINE HCL 2 % IJ SOLN
INTRAMUSCULAR | Status: AC
  Filled 2017-03-06: qty 20

## 2017-03-06 MED ORDER — CHLORHEXIDINE GLUCONATE CLOTH 2 % EX PADS: 6.0000 | MEDICATED_PAD | Freq: Once | CUTANEOUS | Status: DC

## 2017-03-06 MED ORDER — DEXAMETHASONE SODIUM PHOSPHATE 4 MG/ML IJ SOLN
INTRAMUSCULAR | Status: DC | PRN
  Administered 2017-03-06: 4 mg via INTRAVENOUS

## 2017-03-06 MED ORDER — SODIUM CHLORIDE 0.9 % IV SOLN
INTRAVENOUS | Status: DC
  Administered 2017-03-06: 14:00:00 via INTRAVENOUS

## 2017-03-06 MED ORDER — CEFAZOLIN SODIUM-DEXTROSE 2-4 GM/100ML-% IV SOLN
INTRAVENOUS | Status: AC
  Filled 2017-03-06: qty 100

## 2017-03-06 MED ORDER — PROPOFOL 10 MG/ML IV BOLUS
INTRAVENOUS | Status: DC | PRN
  Administered 2017-03-06: 10 mg via INTRAVENOUS
  Administered 2017-03-06 (×2): 20 mg via INTRAVENOUS

## 2017-03-06 MED ORDER — LIDOCAINE HCL 2 % IJ SOLN
INTRAMUSCULAR | Status: DC | PRN
  Administered 2017-03-06: 5 mL

## 2017-03-06 MED ORDER — 0.9 % SODIUM CHLORIDE (POUR BTL) OPTIME
TOPICAL | Status: DC | PRN
  Administered 2017-03-06: 1000 mL

## 2017-03-06 MED ORDER — ONDANSETRON HCL 4 MG/2ML IJ SOLN
INTRAMUSCULAR | Status: DC | PRN
  Administered 2017-03-06: 4 mg via INTRAVENOUS

## 2017-03-06 MED ORDER — FENTANYL CITRATE (PF) 100 MCG/2ML IJ SOLN
INTRAMUSCULAR | Status: DC | PRN
  Administered 2017-03-06: 50 ug via INTRAVENOUS

## 2017-03-06 MED ORDER — PROPOFOL 500 MG/50ML IV EMUL
INTRAVENOUS | Status: DC | PRN
  Administered 2017-03-06: 50 ug/kg/min via INTRAVENOUS

## 2017-03-06 MED ORDER — HYDROMORPHONE HCL 1 MG/ML IJ SOLN: 0.2500 mg | INTRAMUSCULAR | Status: DC | PRN

## 2017-03-06 MED ORDER — PROMETHAZINE HCL 25 MG/ML IJ SOLN: 6.2500 mg | INTRAMUSCULAR | Status: DC | PRN

## 2017-03-06 MED ORDER — BUPIVACAINE HCL (PF) 0.5 % IJ SOLN
INTRAMUSCULAR | Status: AC
  Filled 2017-03-06: qty 30

## 2017-03-06 MED ORDER — BUPIVACAINE HCL (PF) 0.5 % IJ SOLN
INTRAMUSCULAR | Status: DC | PRN
  Administered 2017-03-06: 5 mL

## 2017-03-06 MED ORDER — LACTATED RINGERS IV SOLN
INTRAVENOUS | Status: DC | PRN
  Administered 2017-03-06: 17:00:00 via INTRAVENOUS

## 2017-03-06 SURGICAL SUPPLY — 37 items
BANDAGE ACE 3X5.8 VEL STRL LF (GAUZE/BANDAGES/DRESSINGS) ×3 IMPLANT
BLADE LONG MED 31MMX9MM (MISCELLANEOUS) ×1
BLADE LONG MED 31X9 (MISCELLANEOUS) ×1 IMPLANT
BNDG CMPR 75X41 PLY ABS (GAUZE/BANDAGES/DRESSINGS) ×1
BNDG CMPR 9X4 STRL LF SNTH (GAUZE/BANDAGES/DRESSINGS) ×1
BNDG CONFORM 2 STRL LF (GAUZE/BANDAGES/DRESSINGS) ×3 IMPLANT
BNDG ESMARK 4X9 LF (GAUZE/BANDAGES/DRESSINGS) ×3 IMPLANT
BNDG GAUZE ELAST 4 BULKY (GAUZE/BANDAGES/DRESSINGS) ×3 IMPLANT
BNDG STRETCH 4X75 NS LF (GAUZE/BANDAGES/DRESSINGS) ×2 IMPLANT
CUFF TOURNIQUET SINGLE 18IN (TOURNIQUET CUFF) IMPLANT
DRSG EMULSION OIL 3X3 NADH (GAUZE/BANDAGES/DRESSINGS) ×3 IMPLANT
DURAPREP 26ML APPLICATOR (WOUND CARE) ×3 IMPLANT
ELECT REM PT RETURN 9FT ADLT (ELECTROSURGICAL) ×3
ELECTRODE REM PT RTRN 9FT ADLT (ELECTROSURGICAL) ×1 IMPLANT
GAUZE SPONGE 4X4 12PLY STRL (GAUZE/BANDAGES/DRESSINGS) ×3 IMPLANT
GLOVE BIO SURGEON STRL SZ7.5 (GLOVE) ×6 IMPLANT
GLOVE BIOGEL PI IND STRL 7.5 (GLOVE) ×1 IMPLANT
GLOVE BIOGEL PI INDICATOR 7.5 (GLOVE) ×2
GOWN STRL REUS W/ TWL LRG LVL3 (GOWN DISPOSABLE) ×1 IMPLANT
GOWN STRL REUS W/ TWL XL LVL3 (GOWN DISPOSABLE) ×1 IMPLANT
GOWN STRL REUS W/TWL LRG LVL3 (GOWN DISPOSABLE) ×3
GOWN STRL REUS W/TWL XL LVL3 (GOWN DISPOSABLE) ×3
KIT BASIN OR (CUSTOM PROCEDURE TRAY) ×3 IMPLANT
NDL HYPO 25X1 1.5 SAFETY (NEEDLE) ×1 IMPLANT
NDL SAFETY ECLIPSE 18X1.5 (NEEDLE) IMPLANT
NEEDLE HYPO 18GX1.5 SHARP (NEEDLE)
NEEDLE HYPO 25X1 1.5 SAFETY (NEEDLE) ×3 IMPLANT
NS IRRIG 1000ML POUR BTL (IV SOLUTION) IMPLANT
PACK ORTHO EXTREMITY (CUSTOM PROCEDURE TRAY) IMPLANT
PADDING CAST ABS 4INX4YD NS (CAST SUPPLIES) ×2
PADDING CAST ABS COTTON 4X4 ST (CAST SUPPLIES) ×1 IMPLANT
SUT MNCRL AB 3-0 PS2 18 (SUTURE) ×3 IMPLANT
SUT MNCRL AB 4-0 PS2 18 (SUTURE) IMPLANT
SUT MON AB 5-0 PS2 18 (SUTURE) ×3 IMPLANT
SUT PROLENE 4 0 PS 2 18 (SUTURE) ×3 IMPLANT
SYR 10ML LL (SYRINGE) IMPLANT
UNDERPAD 30X30 (UNDERPADS AND DIAPERS) ×3 IMPLANT

## 2017-03-06 NOTE — Op Note (Signed)
03/06/2017  4:50 PM  PATIENT:  [name]  71 y.o. female  PRE-OPERATIVE DIAGNOSIS:  OSTEOMYELITIS  POST-OPERATIVE DIAGNOSIS:  OSTEOMYELITIS  PROCEDURE:  Procedure(s): AMPUTATION TOE, INTERPHANGEAL 2ND RIGHT (Right)  SURGEON:  Surgeon(s) and Role:    * [name], DPM - Primary  PHYSICIAN ASSISTANT:   ASSISTANTS: none   ANESTHESIA:   MAC  EBL:  minimal   BLOOD ADMINISTERED:none  DRAINS: none   LOCAL MEDICATIONS USED:  OTHER 10 cc one-to-one mix of 0.5% Marcaine plain and 2% lidocaine plain  SPECIMEN:  Source of Specimen:  right 2nd toe for pathology  DISPOSITION OF SPECIMEN:  PATHOLOGY  COUNTS:  YES  TOURNIQUET:  * No tourniquets in log *  DICTATION: .Dragon Dictation  PLAN OF CARE: Discharge to home after PACU  PATIENT DISPOSITION:  PACU - hemodynamically stable.   Delay start of Pharmacological VTE agent (>24hrs) due to surgical blood loss or risk of bleeding: no  Indications for surgery: [name] presented to the office with concerns of a wound infection of the right second toe.  Upon evaluation probes directly down to bone and there was drainage coming from the area.  She was started on antibiotics.  After follow-up evaluation was found that the wound still probed down to the distal phalanx there is radiolucency on x-ray consistent with osteomyelitis.  Because of this we discussed amputation of the digit.  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  I discussed her alternatives, risks, complications.  No promises or guarantees were given at the outcome of the surgery and all questions were answered the best my ability.  Procedure in detail: The patient was both verbally, visually identified by myself the nursing staff and the anesthesia staff in the preoperative  holding area.  She was then transferred to the operating room via stretcher and placed on the operating table in supine position.  Once under adequate plane of anesthesia a mixture of lidocaine and Marcaine plain was infiltrated in a digital block fashion to the right second toe.  The right lower extremity was then scrubbed, prepped, draped in the normal sterile fashion.  A fishmouth incision was planned along the right second PIPJ.  Incision was made with a 15 blade scalpel from skin to bone and the toe was disarticulated at the level of the PIPJ.  A sagittal bone saw was utilized to resect a portion of the proximal phalanx.  At this time the wound was inspected and there is no purulence identified in the remaining bones appear to be hard in nature and white in color.  The rough edges of the bone were contoured.  Incision was copiously irrigated with sterile saline and hemostasis was achieved.  The incision was then closed with 4-0 Prolene with simple interrupted sutures.  Betadine was painted over the incision followed by Xeroform and a dry sterile dressing.  She was then awoken from anesthesia and found to have tolerated the procedure well without any complications.  She was transferred to PACU with vital signs stable and vascular status intact in the digits.  Celesta Gentile, DPM

## 2017-03-06 NOTE — Anesthesia Postprocedure Evaluation (Signed)
Anesthesia Post Note  Patient: [name]  Procedure(s) Performed: AMPUTATION TOE, INTERPHANGEAL 2ND RIGHT (Right )     Patient location during evaluation: PACU Anesthesia Type: MAC Level of consciousness: awake and alert Pain management: pain level controlled Vital Signs Assessment: post-procedure vital signs reviewed and stable Respiratory status: spontaneous breathing, nonlabored ventilation, respiratory function stable and patient connected to nasal cannula oxygen Cardiovascular status: stable and blood pressure returned to baseline Postop Assessment: no apparent nausea or vomiting Anesthetic complications: no    Last Vitals:  Vitals:   03/06/17 1352 03/06/17 1656  BP:  (!) 142/63  Pulse: 89 82  Resp: 17 20  Temp: 36.7 C 36.6 C  SpO2: 100% 98%    Last Pain:  Vitals:   03/06/17 1656  TempSrc:   PainSc: 0-No pain    LLE Motor Response: Purposeful movement (03/06/17 1656) LLE Sensation: Full sensation (03/06/17 1656) RLE Motor Response: Purposeful movement (03/06/17 1656) RLE Sensation: Full sensation (03/06/17 1656)      Autym Siess S

## 2017-03-06 NOTE — Brief Op Note (Signed)
03/06/2017  4:50 PM  PATIENT:  [name]  71 y.o. female  PRE-OPERATIVE DIAGNOSIS:  OSTEOMYELITIS  POST-OPERATIVE DIAGNOSIS:  OSTEOMYELITIS  PROCEDURE:  Procedure(s): AMPUTATION TOE, INTERPHANGEAL 2ND RIGHT (Right)  SURGEON:  Surgeon(s) and Role:    * [name], DPM - Primary  PHYSICIAN ASSISTANT:   ASSISTANTS: none   ANESTHESIA:   MAC  EBL:  minimal   BLOOD ADMINISTERED:none  DRAINS: none   LOCAL MEDICATIONS USED:  OTHER 10 cc one-to-one mix of 0.5% Marcaine plain and 2% lidocaine plain  SPECIMEN:  Source of Specimen:  right 2nd toe for pathology  DISPOSITION OF SPECIMEN:  PATHOLOGY  COUNTS:  YES  TOURNIQUET:  * No tourniquets in log *  DICTATION: .Dragon Dictation  PLAN OF CARE: Discharge to home after PACU  PATIENT DISPOSITION:  PACU - hemodynamically stable.   Delay start of Pharmacological VTE agent (>24hrs) due to surgical blood loss or risk of bleeding: no

## 2017-03-06 NOTE — Anesthesia Preprocedure Evaluation (Addendum)
Anesthesia Evaluation  Patient identified by MRN, date of birth, ID band Patient awake    Reviewed: Allergy & Precautions, H&P , NPO status , Patient's Chart, lab work & pertinent test results, reviewed documented beta blocker date and time   History of Anesthesia Complications (+) PONVNegative for: history of anesthetic complications  Airway Mallampati: II  TM Distance: >3 FB Neck ROM: Full    Dental  (+) Teeth Intact, Caps, Dental Advisory Given   Pulmonary shortness of breath and with exertion, asthma , COPD,  COPD inhaler,    Pulmonary exam normal breath sounds clear to auscultation       Cardiovascular hypertension, Pt. on home beta blockers and Pt. on medications + CAD, + Past MI, + Peripheral Vascular Disease and +CHF  Normal cardiovascular exam Rhythm:Regular Rate:Normal  Echo 07/2009: Mild LVH, EF 40-45%, inferior and posterior HK, grade 1 diastolic dysfunction, moderate MAC, mild to moderate MR, mild LAE   Neuro/Psych    GI/Hepatic negative GI ROS, Neg liver ROS,   Endo/Other  diabetes, Insulin Dependent  Renal/GU Renal disease     Musculoskeletal negative musculoskeletal ROS (+)   Abdominal   Peds  Hematology negative hematology ROS (+)   Anesthesia Other Findings   Reproductive/Obstetrics                            Anesthesia Physical  Anesthesia Plan  ASA: III  Anesthesia Plan: MAC   Post-op Pain Management:    Induction: Intravenous  PONV Risk Score and Plan: 3 and Ondansetron, Dexamethasone and Propofol infusion  Airway Management Planned: Natural Airway and Simple Face Mask  Additional Equipment:   Intra-op Plan:   Post-operative Plan:   Informed Consent: I have reviewed the patients History and Physical, chart, labs and discussed the procedure including the risks, benefits and alternatives for the proposed anesthesia with the patient or authorized  representative who has indicated his/her understanding and acceptance.   Dental advisory given  Plan Discussed with: CRNA, Anesthesiologist and Surgeon  Anesthesia Plan Comments:         Anesthesia Quick Evaluation

## 2017-03-06 NOTE — Discharge Instructions (Signed)
Continue medications as previous before coming to the hospital.  Continue antibiotics.  He has a prescription for Vicodin if needed.  Keep the dressing clean, dry, intact.  I will see you on Monday or please call [phone] if any questions, concerns or any issues.

## 2017-03-06 NOTE — Transfer of Care (Signed)
Immediate Anesthesia Transfer of Care Note  Patient: [name]  Procedure(s) Performed: AMPUTATION TOE, INTERPHANGEAL 2ND RIGHT (Right )  Patient Location: PACU  Anesthesia Type:MAC  Level of Consciousness: awake, alert  and oriented  Airway & Oxygen Therapy: Patient Spontanous Breathing  Post-op Assessment: Report given to RN and Post -op Vital signs reviewed and stable  Post vital signs: Reviewed and stable  Last Vitals:  Vitals:   03/06/17 1352 03/06/17 1656  BP:  (!) 142/63  Pulse: 89 82  Resp: 17 20  Temp: 36.7 C 36.6 C  SpO2: 100% 98%    Last Pain:  Vitals:   03/06/17 1656  TempSrc:   PainSc: 0-No pain         Complications: No apparent anesthesia complications

## 2017-03-06 NOTE — H&P (Signed)
Subjective: [name] presents to [facility] hospital for surgical intervention due to osteomyelitis/ulcertion to the right 2nd digit. She presented with a wound which probed directly to bone and there was drainage and cellulitis. She is on doxycycline and the redness resolved but the ulcer continued. X-rays confirmed osteomyelitis. Due to this discussed surgery to remove infected bone/amputation.  Denies any systemic complaints such as fevers, chills, nausea, vomiting. No acute changes since last appointment, and no other complaints at this time.   Objective: AAO x3, NAD DP/PT pulses decreased bilaterally, CRT less than 3 seconds except right 2nd digit Ulcer to the distal 2nd toe at the distal aspect that probes to bone. No pus or drainage today. No ascending cellulitis. No fluctuance.  Pre-ulcerative to the right 3rd toe at dorsal PIPJ with no signs of infection.  Hammertoes present No other open lesions or pre-ulcerative lesions.  No pain with calf compression, swelling, warmth, erythema  Assessment: Right 2nd digit osteomyelitis, ulcer  Plan: -All treatment options discussed with the patient including all alternatives, risks, complications. -Discussed the surgery as well as the postop course with her and her husband today. Discussed change of nonhealing, further amputation and other risks.  I discussed risks of the surgery which include, but not limited to, infection, bleeding, pain, swelling, need for further surgery, delayed or nonhealing, painful or ugly scar, numbness or sensation changes, over/under correction, recurrence, transfer lesions, further deformity, DVT/PE, loss of foot/leg. Patient understands these risks and wishes to proceed with surgery. The surgical consent was reviewed with the patient and again signed today -NPO -Postop surgical shoe after surgery -Continue doxycycline postop.  -The patient and her husband had no further questions today.   Celesta Gentile, DPM

## 2017-03-07 ENCOUNTER — Encounter (HOSPITAL_COMMUNITY): Payer: Self-pay | Admitting: Podiatry

## 2017-03-09 ENCOUNTER — Ambulatory Visit (INDEPENDENT_AMBULATORY_CARE_PROVIDER_SITE_OTHER): Payer: Medicare Other | Admitting: Podiatry

## 2017-03-09 ENCOUNTER — Encounter: Payer: Self-pay | Admitting: Podiatry

## 2017-03-09 ENCOUNTER — Ambulatory Visit: Payer: Medicare Other

## 2017-03-09 ENCOUNTER — Encounter: Payer: Medicare Other | Admitting: Podiatry

## 2017-03-09 DIAGNOSIS — M2011 Hallux valgus (acquired), right foot: Secondary | ICD-10-CM

## 2017-03-09 DIAGNOSIS — M86071 Acute hematogenous osteomyelitis, right ankle and foot: Secondary | ICD-10-CM

## 2017-03-09 MED ORDER — DOXYCYCLINE HYCLATE 100 MG PO TABS: 100.0000 mg | ORAL_TABLET | Freq: Two times a day (BID) | ORAL | 0 refills | Status: DC

## 2017-03-10 NOTE — Progress Notes (Signed)
Subjective: [name] is a 71 y.o. is seen today in office s/p right partial 2nd toe amputaiton preformed on 03/06/2017. She has been resting and keeping the foot elevated and wearing the surgery. They state their pain is minimal but she felt like it was a "stubbed toe". She is still on doxycycline.  Denies any systemic complaints such as fevers, chills, nausea, vomiting. No calf pain, chest pain, shortness of breath.   Objective: General: No acute distress, AAOx3  DP/PT pulses palpable 2/4, CRT < 3 sec to all digits.  Protective sensation intact. Motor function intact.  Right foot: Incision is well coapted without any evidence of dehiscence and sutures are intact. There is no surrounding erythema, ascending cellulitis, fluctuance, crepitus, malodor, drainage/purulence. There is minimal edema around the surgical site. There is no pain along the surgical site. So far the incision appears to be healing well and no clinical signs of infection.  No other areas of tenderness to bilateral lower extremities.  No other open lesions or pre-ulcerative lesions.  No pain with calf compression, swelling, warmth, erythema.   Assessment and Plan:  Status post right foot surgery, doing well with no complications   -Treatment options discussed including all alternatives, risks, and complications -X-ray reviewed from the hospital. Unable to get new ones today.  -Incision is healing well. Small amount of betadine was applied over the incision followed by a DSD. Keep clean, dry, intact.  -Remain in surgical shoe.  -Elevation -Continue doxycyline until next appointment  -Pain medication as needed. -Monitor for any clinical signs or symptoms of infection and DVT/PE and directed to call the office immediately should any occur or go to the ER. -Follow-up in 1 week or sooner if any problems arise. In the meantime, encouraged to call the office with any questions, concerns, change in symptoms.   Celesta Gentile, DPM

## 2017-03-11 ENCOUNTER — Other Ambulatory Visit: Payer: Self-pay | Admitting: Physician Assistant

## 2017-03-11 ENCOUNTER — Other Ambulatory Visit (HOSPITAL_COMMUNITY): Payer: Medicare Other

## 2017-03-11 DIAGNOSIS — I739 Peripheral vascular disease, unspecified: Secondary | ICD-10-CM

## 2017-03-13 ENCOUNTER — Encounter: Payer: Medicare Other | Admitting: Podiatry

## 2017-03-13 ENCOUNTER — Encounter: Payer: Self-pay | Admitting: Podiatry

## 2017-03-13 ENCOUNTER — Ambulatory Visit (INDEPENDENT_AMBULATORY_CARE_PROVIDER_SITE_OTHER): Payer: Medicare Other | Admitting: Podiatry

## 2017-03-13 DIAGNOSIS — Z9889 Other specified postprocedural states: Secondary | ICD-10-CM

## 2017-03-13 DIAGNOSIS — M86071 Acute hematogenous osteomyelitis, right ankle and foot: Secondary | ICD-10-CM

## 2017-03-16 ENCOUNTER — Encounter: Payer: Medicare Other | Admitting: Podiatry

## 2017-03-18 ENCOUNTER — Other Ambulatory Visit (HOSPITAL_COMMUNITY): Payer: Medicare Other

## 2017-03-18 NOTE — Progress Notes (Signed)
Subjective: [name] is a 71 y.o. is seen today in office s/p right partial 2nd toe amputaiton preformed on 03/06/2017.  I had her come in early as she was scheduled for next week but due to the upcoming weather I want to see her beforehand make sure that she is having no issues.  She states that she has been doing well and she has been wearing a surgical shoe although she has been a little bit more active.  She has been taking the antibiotics.  She has no new concerns. Denies any systemic complaints such as fevers, chills, nausea, vomiting. No calf pain, chest pain, shortness of breath.   Objective: General: No acute distress, AAOx3  DP/PT pulses palpable 2/4, CRT < 3 sec to all digits.  Protective sensation intact. Motor function intact.  Right foot: Incision is well coapted without any evidence of dehiscence and sutures are intact.  Incision appears to be healing well there is no signs of infection.  There is no surrounding erythema, ascending cellulitis, fluctuance, crepitus, malodor, drainage/purulence. There is trace edema around the surgical site. There is no pain along the surgical site.  She and her husband who brings her today are very pleased the way it looks so far as well. No other areas of tenderness to bilateral lower extremities.  No other open lesions or pre-ulcerative lesions.  No pain with calf compression, swelling, warmth, erythema.   Assessment and Plan:  Status post right foot surgery, doing well with no complications   -Treatment options discussed including all alternatives, risks, and complications incision is healing well.  Antibiotic ointment was applied followed by a bandage.  Keep the dressing clean, dry, intact. -Incision is healing well. Small amount of betadine was applied over the incision followed by a DSD. Keep clean, dry, intact.  -Remain in surgical shoe.  -Elevation -Finish course of doxycyline she has not been eating this -Pain medication as needed-   -Monitor for any clinical signs or symptoms of infection and DVT/PE and directed to call the office immediately should any occur or go to the ER. -Follow-up in 10 days or sooner if any problems arise. In the meantime, encouraged to call the office with any questions, concerns, change in symptoms.   Celesta Gentile, DPM

## 2017-03-23 ENCOUNTER — Ambulatory Visit (INDEPENDENT_AMBULATORY_CARE_PROVIDER_SITE_OTHER): Payer: Medicare Other | Admitting: Podiatry

## 2017-03-23 DIAGNOSIS — M86071 Acute hematogenous osteomyelitis, right ankle and foot: Secondary | ICD-10-CM

## 2017-03-23 DIAGNOSIS — Z9889 Other specified postprocedural states: Secondary | ICD-10-CM

## 2017-03-23 NOTE — Progress Notes (Signed)
Subjective: [name] is a 71 y.o. is seen today in office s/p right partial 2nd toe amputaiton preformed on 03/06/2017.  She states that she is doing well.  She has been more active around the house she has been wearing the surgical shoe.  She has not been changing the bandage.  She has finished a course of antibiotics.  She has no pain to the area and overall she states that she is doing well. Denies any systemic complaints such as fevers, chills, nausea, vomiting. No calf pain, chest pain, shortness of breath.   Objective: General: No acute distress, AAOx3  DP/PT pulses palpable 2/4, CRT < 3 sec to all digits.  Protective sensation intact. Motor function intact.  Right foot: Incision is well coapted without any evidence of dehiscence and sutures are intact.  There is no drainage or pus there is no surrounding erythema.  There is no ascending cellulitis.  No clinical signs of infection.  The dorsal right third toe is a scab which is loose and upon debridement there is a superficial wound present to the PIPJ but there is no underlying drainage.  There is no probing, undermining or tunneling.  No clinical signs of infection noted today. No other open lesions or pre-ulcerative lesions.  No pain with calf compression, swelling, warmth, erythema.   Assessment and Plan:  Status post right foot surgery, doing well with no complications; wound right third toe  -Treatment options discussed including all alternatives, risks, and complications -Incision is healing well however I did leave the stitches in today to ensure healing.  I will plan on removing these next week.  Antibiotic ointment and a bandage was applied.  Keep this clean, dry, intact but she can changes if needed.  Also debrided the wound to the right third toe dorsally there is no signs of infection but continue to monitor.  I cleaned the foot today as well.  Continue with the surgical shoe.  Elevation. -Monitor for any clinical signs or  symptoms of infection and directed to call the office immediately should any occur or go to the ER. -Follow-up next week for suture removal or sooner if any issues are to arise.  Should no further questions and she agrees this plan today.  [name] DPM

## 2017-04-02 ENCOUNTER — Ambulatory Visit (INDEPENDENT_AMBULATORY_CARE_PROVIDER_SITE_OTHER): Payer: Medicare Other | Admitting: Podiatry

## 2017-04-02 ENCOUNTER — Inpatient Hospital Stay (HOSPITAL_COMMUNITY): Admission: RE | Admit: 2017-04-02 | Payer: Medicare Other | Source: Ambulatory Visit

## 2017-04-02 DIAGNOSIS — M86071 Acute hematogenous osteomyelitis, right ankle and foot: Secondary | ICD-10-CM | POA: Diagnosis not present

## 2017-04-02 DIAGNOSIS — I2510 Atherosclerotic heart disease of native coronary artery without angina pectoris: Secondary | ICD-10-CM

## 2017-04-02 DIAGNOSIS — M2041 Other hammer toe(s) (acquired), right foot: Secondary | ICD-10-CM

## 2017-04-06 NOTE — Progress Notes (Signed)
Subjective: [name] is a 71 y.o. is seen today in office s/p right partial 2nd toe amputaiton preformed on 03/06/2017.  She presents today for suture removal.  She is remained in the surgical shoe which she states she has been doing well.  She has not been taking any antibiotics and she denies any pain.  She states that she has been more active and not causing any issues.  She states that the fourth toe is also doing better after I cleaned the scab to the dorsal toe.  She is inquiring about doing to help straighten out the fourth toe to help prevent ulceration of that toe.  She has no other questions or concerns today. Denies any systemic complaints such as fevers, chills, nausea, vomiting. No calf pain, chest pain, shortness of breath.   Objective: General: No acute distress, AAOx3  DP/PT pulses palpable 2/4, CRT < 3 sec to all digits.  Protective sensation intact. Motor function intact.  Right foot: Incision is well coapted without any evidence of dehiscence and sutures are intact.  The incision appears to be healing well there is no movement across the incision.  I removed the sutures today without any complications the incision remains well coapted.  There is no edema, erythema, drainage or pus there is no clinical signs of infection noted.  The dorsal aspect the right fourth toe is a pre-ulcerative area but there is no skin breakdown identified today.  There is semirigid hammertoe contracture present of the right fourth toe.  There is no other open lesions or pre-ulcerative lesion identified today. No pain with calf compression, swelling, warmth, erythema.   Assessment and Plan:  Status post right foot surgery, doing well with no complications; wound right 4th toe  -Treatment options discussed including all alternatives, risks, and complications -Sutures removed today without any complications the incision remained healing well coapted.  Antibiotic ointment was applied.  She can continue  this at home as well as the dressing.  She can slowly start to transition to her diabetic she was able.  Monitor for signs or symptoms of infection. -We discussed a flexor tenotomy of the right fourth toe given the hammertoe contracture as well as the wound to help prevent any further ulceration or amputation.  After discussion regards to this she wishes to proceed we will plan on doing this next week.  We discussed the procedure as well as the postoperative course. -Follow-up on Wednesday for a flexor tenotomy or sooner if needed.    [name] DPM

## 2017-04-08 ENCOUNTER — Ambulatory Visit (INDEPENDENT_AMBULATORY_CARE_PROVIDER_SITE_OTHER): Payer: Medicare Other | Admitting: Podiatry

## 2017-04-08 ENCOUNTER — Encounter: Payer: Self-pay | Admitting: Podiatry

## 2017-04-08 DIAGNOSIS — M2041 Other hammer toe(s) (acquired), right foot: Secondary | ICD-10-CM | POA: Diagnosis not present

## 2017-04-09 NOTE — Progress Notes (Signed)
Subjective: [name] presents the office today for concerns of continued hammertoe of the right fourth toe which is causing pre-ulcerative callus and wound to the dorsal fourth toe on the PIPJ.  She is attempted offloading and padding without any significant improvement.  Discussed a flexor tenotomy to help straighten the toe and she presents today for this procedure.  She has no further questions or concerns and she wished to proceed with the procedure today.  She states the amputation site on the third digit is doing very well and is healed. Denies any systemic complaints such as fevers, chills, nausea, vomiting. No acute changes since last appointment, and no other complaints at this time.   Objective: AAO x3, NAD DP/PT pulses palpable bilaterally, CRT less than 3 seconds Protective sensation absent with Derrel Nip monofilament Amputation site of the right third digit has healed well. To the dorsal aspect of the right fourth toe is a hyperkeratotic pre-ulcerative area.  Patient is very concerned about the toe contracture.  The toe is semi-reducible however starting to become more rigid.  There is no edema, erythema, drainage or pus or any clinical signs of infection noted. No other open lesions identified today.  There were also lesion identified otherwise. No pain with calf compression, swelling, warmth, erythema  Assessment: Hammertoe right fourth digit resulted in the pre-ulcerative callus  Plan: -All treatment options discussed with the patient including all alternatives, risks, complications.  -At this time we discussed both conservative as well as surgical treatment options and she wishes to proceed with surgical intervention.  We discussed a flexor tenotomy and should perform this in the office today.  We discussed this is not a guarantee of resolution of symptoms and she may need to have further surgery and she is still at high risk for amputation.  She understands this and wishes to  proceed. -Procedure: Flexor Tenotomy Indication for Procedure: toe with semi-reducible hammertoe with distal tip ulceration. Flexor tenotomy indicated to alleviate contracture, reduce pressure, and enhance healing of the ulceration. Location: right, 4th toe Anesthesia: 3 cc of lidocaine, Marcaine mixture digital block Instrumentation: 15 blade, 4-0 nylon. Technique: The toe was anesthetized as above and prepped in the usual fashion with Betadine.  A 15 blade was then used to make a transverse incision over the plantar aspect of the distal interphalangeal joint. The flexor tendon was incised with noted release of the hammertoe deformity. The incision was then closed with 4-0 nylon and dressed with sterile dressing. Patient tolerated the procedure well. Dressing: Dry, sterile, compression dressing. Disposition: Patient tolerated procedure well. Patient to return in 10 days for follow-up. -Patient encouraged to call the office with any questions, concerns, change in symptoms.   [name] DPM

## 2017-04-13 ENCOUNTER — Other Ambulatory Visit: Payer: Self-pay

## 2017-04-13 ENCOUNTER — Ambulatory Visit (HOSPITAL_COMMUNITY): Payer: Medicare Other | Attending: Cardiology

## 2017-04-13 DIAGNOSIS — I2510 Atherosclerotic heart disease of native coronary artery without angina pectoris: Secondary | ICD-10-CM | POA: Diagnosis not present

## 2017-04-13 DIAGNOSIS — I110 Hypertensive heart disease with heart failure: Secondary | ICD-10-CM | POA: Insufficient documentation

## 2017-04-13 DIAGNOSIS — E785 Hyperlipidemia, unspecified: Secondary | ICD-10-CM | POA: Insufficient documentation

## 2017-04-13 DIAGNOSIS — I509 Heart failure, unspecified: Secondary | ICD-10-CM | POA: Diagnosis not present

## 2017-04-13 DIAGNOSIS — E119 Type 2 diabetes mellitus without complications: Secondary | ICD-10-CM | POA: Insufficient documentation

## 2017-04-14 ENCOUNTER — Encounter: Payer: Self-pay | Admitting: *Deleted

## 2017-04-15 DIAGNOSIS — N184 Chronic kidney disease, stage 4 (severe): Secondary | ICD-10-CM | POA: Diagnosis not present

## 2017-04-15 DIAGNOSIS — Z853 Personal history of malignant neoplasm of breast: Secondary | ICD-10-CM | POA: Diagnosis not present

## 2017-04-15 DIAGNOSIS — I129 Hypertensive chronic kidney disease with stage 1 through stage 4 chronic kidney disease, or unspecified chronic kidney disease: Secondary | ICD-10-CM | POA: Diagnosis not present

## 2017-04-15 DIAGNOSIS — E1122 Type 2 diabetes mellitus with diabetic chronic kidney disease: Secondary | ICD-10-CM | POA: Diagnosis not present

## 2017-04-15 DIAGNOSIS — N2581 Secondary hyperparathyroidism of renal origin: Secondary | ICD-10-CM | POA: Diagnosis not present

## 2017-04-16 ENCOUNTER — Encounter (HOSPITAL_COMMUNITY): Payer: Medicare Other

## 2017-04-17 ENCOUNTER — Encounter: Payer: Self-pay | Admitting: Podiatry

## 2017-04-17 ENCOUNTER — Ambulatory Visit (INDEPENDENT_AMBULATORY_CARE_PROVIDER_SITE_OTHER): Payer: Medicare Other | Admitting: Podiatry

## 2017-04-17 DIAGNOSIS — M2041 Other hammer toe(s) (acquired), right foot: Secondary | ICD-10-CM

## 2017-04-17 DIAGNOSIS — Z9889 Other specified postprocedural states: Secondary | ICD-10-CM

## 2017-04-20 NOTE — Progress Notes (Signed)
Subjective: [name] is a 72 y.o. is seen today in office s/p right third digit flexor tenotomy preformed on 04/08/2017.  She states that she does not have any pain to the area.  She denies any increase in swelling or redness.  She is remained in the surgical shoe.  She has no concerns today.  She presents today for suture removal.  Denies any systemic complaints such as fevers, chills, nausea, vomiting. No calf pain, chest pain, shortness of breath.   Objective: General: No acute distress, AAOx3  DP/PT pulses palpable 2/4, CRT < 3 sec to all digits.  Protective sensation intact. Motor function intact.  Right foot: Incision is well coapted without any evidence of dehiscence and the incision appears to be healed.  There is no surrounding erythema, ascending cellulitis, fluctuance, crepitus, malodor, drainage/purulence. There is no significant edema around the surgical site. There is no pain along the surgical site.  Toes and rectus position. No other areas of tenderness to bilateral lower extremities.  No other open lesions or pre-ulcerative lesions.  No pain with calf compression, swelling, warmth, erythema.   Assessment and Plan:  Status post right third digit flexor tenotomy, doing well with no complications   -Treatment options discussed including all alternatives, risks, and complications -Suture was removed today.  Incision is healed.  There is no signs of infection.  She can start to transition to regular diabetic shoe as tolerated. -Discussed daily foot inspection -Monitor for any clinical signs or symptoms of infection and DVT/PE and directed to call the office immediately should any occur or go to the ER. -Follow-up 4 weeks or sooner if any problems arise. In the meantime, encouraged to call the office with any questions, concerns, change in symptoms.   Celesta Gentile, DPM  *Of note the previous notes a sub-fourth toe but I did a flexor tenotomy on the third toe and this was  confirmed today.

## 2017-04-24 NOTE — Progress Notes (Addendum)
Patient ID: ORA BOLLIG, female   DOB: Dec 19, 1945, 72 y.o.   MRN: [id number]  FOLLOW UP  Assessment:    Essential hypertension - continue medications, DASH diet, exercise and monitor at home. Call if greater than 130/80.  - will monitor at home, may have to increase hydralazine to TID -     CBC with Differential/Platelet -     BASIC METABOLIC PANEL WITH GFR -     Hepatic function panel -     TSH  Congestive heart failure, unspecified HF chronicity, unspecified heart failure type (HCC) Weight stable, decrease sodium, decrease weight Monitor weight daily  Peripheral vascular disease (HCC) Control blood pressure, cholesterol, glucose, increase exercise.   EMPHYSEMA Monitor CXR, continue inhaler  Uncontrolled type 2 diabetes mellitus with diabetic neuropathy, without long-term current use of insulin ([place]) Discussed general issues about diabetes pathophysiology and management., Educational material distributed., Suggested low cholesterol diet., Encouraged aerobic exercise., Discussed foot care., Reminded to get yearly retinal exam. -     Hemoglobin A1c - CONTINUE WAKE AND WEIGHT LOSS  CKD stage 3 due to type 2 diabetes mellitus ([place]) Discussed general issues about diabetes pathophysiology and management., Educational material distributed., Suggested low cholesterol diet., Encouraged aerobic exercise., Discussed foot care., Reminded to get yearly retinal exam. -     Hemoglobin A1c - having foamy urine, will check urine  Diabetes mellitus type 2, insulin dependent ([place]) Discussed general issues about diabetes pathophysiology and management., Educational material distributed., Suggested low cholesterol diet., Encouraged aerobic exercise., Discussed foot care., Reminded to get yearly retinal exam. -     Hemoglobin A1c - REMINDED TO GET EYE EXAM  Hyperlipidemia -continue medications, check lipids, decrease fatty foods, increase activity.  -     Lipid panel  Malignant neoplasm of  upper-inner quadrant of left breast in female, estrogen receptor positive ([place]) Will be out 5 year mark this year sept  Medication management -     Magnesium  Diabetes mellitus due to underlying condition with foot ulcer (CODE) ([place]) continue follow up podiatry Feet checks Control sugar  LVH (left ventricular hypertrophy) due to hypertensive disease, with heart failure ([place]) - continue medications, DASH diet, exercise and monitor at home. Call if greater than 130/80.  No symptoms of fluid overload at this time  CKD stage 3  avoid NSAIDS, monitor sugars, will monitor  Over 30 minutes of exam, counseling, chart review, and critical decision making was performed Future Appointments  Date Time Provider [place]  05/15/2017  1:15 PM [name], DPM [facility]  08/04/2017  3:00 PM Unk Pinto, MD [facility] None  02/10/2018 11:00 AM [facility] 1 [facility] None  02/10/2018 11:30 AM Magrinat, Virgie Dad, MD [facility] None    Subjective:   [name] is a 72 y.o. female who presents for 3 month follow up on hypertension, diabetes, hyperlipidemia, vitamin D def.   Her blood pressure has been controlled at home, today their BP is BP: (!) 142/80 She does not workout. She denies chest pain, shortness of breath, dizziness.  She has had breast cancer in 2014, follows with [name], she is on anastrozole.  Her last Echo showed an EF of 06-30% with diastolic dysfunction.  She is on cholesterol medication and denies myalgias. Her cholesterol is at goal. The cholesterol last visit  Lab Results  Component Value Date   CHOL 137 10/13/2016   HDL 32 (L) 10/13/2016   LDLCALC 63 10/13/2016   TRIG 210 (H) 10/13/2016  CHOLHDL 4.3 10/13/2016   She has been working on diet and exercise for Diabetes with diabetic chronic kidney disease, with other circulatory complications and with diabetic polyneuropathy, she is on bASA, she is on ACE/ARB, she is on 60 in  the AM and 20-25 in the evening decreasing it slowly, she has 41 inch waist, and denies paresthesia of the feet, polydipsia, polyuria and visual disturbances. Last A1C was was checked at [facility] 12/22/2016 Lab Results  Component Value Date   HGBA1C 10.2 (H) 10/13/2016   She follows with kidney doctor, she is off ARB and on hydralazine. She has started to see some foam in her urine. Last GFR  Lab Results  Component Value Date   GFRNONAA 15 (L) 03/06/2017   BMI is Body mass index is 30.12 kg/m., she is working on diet and exercise. Has had some weight gain with recent foot surgery for DM ulcer of left foot due to [name],  Due to the holidays she has gained some weight, will start back up to weight management.  Wt Readings from Last 3 Encounters:  04/28/17 181 lb (82.1 kg)  03/06/17 172 lb (78 kg)  03/05/17 170 lb 6.4 oz (77.3 kg)     Medication Review Current Outpatient Medications on File Prior to Visit  Medication Sig Dispense Refill  . allopurinol (ZYLOPRIM) 100 MG tablet TAKE 1 TABLET TWICE DAILY 180 tablet 1  . anastrozole (ARIMIDEX) 1 MG tablet Take 1 tablet (1 mg total) by mouth daily. 90 tablet 3  . aspirin 325 MG tablet Take 325 mg by mouth at bedtime.     Marland Kitchen aspirin-acetaminophen-caffeine (EXCEDRIN MIGRAINE) 250-250-65 MG tablet Take 1 tablet by mouth daily as needed (for sinus headaches.).    Marland Kitchen beta carotene w/minerals (OCUVITE) tablet Take 1 tablet by mouth daily.    . carvedilol (COREG) 12.5 MG tablet TAKE 1 TABLET TWICE DAILY FOR BLOOD PRESSURE 180 tablet 1  . Cholecalciferol (VITAMIN D3) 2000 units TABS Take 2,000 Units by mouth daily. On hold due to upcoming procedure    . docusate sodium (COLACE) 100 MG capsule Take 100 mg by mouth at bedtime.    . hydrALAZINE (APRESOLINE) 50 MG tablet Take 50 mg by mouth 2 (two) times daily.    . hydrochlorothiazide (HYDRODIURIL) 12.5 MG tablet TAKE 1 TABLET EVERY DAY 90 tablet 1  . insulin NPH-regular Human (NOVOLIN 70/30) (70-30)  100 UNIT/ML injection Inject 30-65 Units into the skin 2 (two) times daily. Use 60-65 units in the morning & 30-35 units in the evening (varies based on blood sugar)    . Polyethyl Glycol-Propyl Glycol (LUBRICANT EYE DROPS) 0.4-0.3 % SOLN Place 1-2 drops into both eyes 3 (three) times daily as needed (for dry eyes.).    Marland Kitchen rosuvastatin (CRESTOR) 20 MG tablet TAKE 1 TABLET EVERY DAY (Patient taking differently: TAKE 1 TABLET (20 MG) EVERY DAY IN THE EVENING.) 90 tablet 1   No current facility-administered medications on file prior to visit.     Current Problems (verified) Patient Active Problem List   Diagnosis Date Noted  . CKD stage 3 due to type 2 diabetes mellitus ([place]) 05/08/2015  . Diabetes mellitus type 2, insulin dependent ([place]) 05/08/2015  . BMI 29.0-29.9,adult 02/14/2015  . Medication management 08/04/2014  . Type II diabetes mellitus with neurological manifestations, uncontrolled ([place]) 02/17/2014  . Gouty arthritis   . Vitamin D deficiency   . CKD (chronic kidney disease) stage 3, GFR 30-59 ml/min (HCC) 06/04/2013  . Malignant neoplasm of  upper-inner quadrant of left breast in female, estrogen receptor positive ([place]) 02/14/2013  . Peripheral vascular disease ([place]) 09/05/2009  . Hyperlipidemia 06/20/2009  . Essential hypertension 06/20/2009  . Coronary atherosclerosis 06/20/2009  . Congestive heart failure ([place]) 06/20/2009  . EMPHYSEMA 06/20/2009  . Asthma 06/20/2009    Allergies Allergies  Allergen Reactions  . Atenolol Other (See Comments)    Exacerbates gout  . Contrast Media [Iodinated Diagnostic Agents] Rash  . Latex Rash  . Omnipaque [Iohexol] Rash    Surgical History: reviewed and unchanged Family History: reviewed and unchanged Social History: reviewed and unchanged   Objective:   Today's Vitals   04/28/17 1128  BP: (!) 142/80  Pulse: 84  Resp: 16  Temp: 97.6 F (36.4 C)  SpO2: 98%  Weight: 181 lb (82.1 kg)  Height: 5\' 5"  (1.651 m)   Body mass  index is 30.12 kg/m.  General appearance: alert, no distress, WD/WN,  female HEENT: normocephalic, sclerae anicteric, TMs pearly, nares patent, no discharge or erythema, pharynx normal Oral cavity: MMM, no lesions Neck: supple, no lymphadenopathy, no thyromegaly, no masses Heart: RRR, normal S1, S2, no murmurs Lungs: CTA bilaterally, no wheezes, rhonchi, or rales Abdomen: +bs, soft, non tender, non distended, no masses, no hepatomegaly, no splenomegaly Musculoskeletal: nontender, no swelling, no obvious deformity Extremities: no edema, no cyanosis, no clubbing Pulses: 2+ symmetric, upper and lower extremities, normal cap refill Neurological: alert, oriented x 3, CN2-12 intact, strength normal upper extremities and lower extremities, sensation normal throughout, DTRs 2+ throughout, no cerebellar signs, gait normal Psychiatric: normal affect, behavior normal, pleasant    Vicie Mutters, PA-C   04/28/2017

## 2017-04-28 ENCOUNTER — Encounter: Payer: Self-pay | Admitting: Physician Assistant

## 2017-04-28 ENCOUNTER — Ambulatory Visit (INDEPENDENT_AMBULATORY_CARE_PROVIDER_SITE_OTHER): Payer: Medicare Other | Admitting: Physician Assistant

## 2017-04-28 VITALS — BP 142/80 | HR 84 | Temp 97.6°F | Resp 16 | Ht 65.0 in | Wt 181.0 lb

## 2017-04-28 DIAGNOSIS — E119 Type 2 diabetes mellitus without complications: Secondary | ICD-10-CM | POA: Diagnosis not present

## 2017-04-28 DIAGNOSIS — I10 Essential (primary) hypertension: Secondary | ICD-10-CM | POA: Diagnosis not present

## 2017-04-28 DIAGNOSIS — Z794 Long term (current) use of insulin: Secondary | ICD-10-CM | POA: Diagnosis not present

## 2017-04-28 DIAGNOSIS — N183 Chronic kidney disease, stage 3 unspecified: Secondary | ICD-10-CM

## 2017-04-28 DIAGNOSIS — E1149 Type 2 diabetes mellitus with other diabetic neurological complication: Secondary | ICD-10-CM

## 2017-04-28 DIAGNOSIS — C50212 Malignant neoplasm of upper-inner quadrant of left female breast: Secondary | ICD-10-CM

## 2017-04-28 DIAGNOSIS — Z79899 Other long term (current) drug therapy: Secondary | ICD-10-CM

## 2017-04-28 DIAGNOSIS — I110 Hypertensive heart disease with heart failure: Secondary | ICD-10-CM

## 2017-04-28 DIAGNOSIS — E782 Mixed hyperlipidemia: Secondary | ICD-10-CM | POA: Diagnosis not present

## 2017-04-28 DIAGNOSIS — J438 Other emphysema: Secondary | ICD-10-CM | POA: Diagnosis not present

## 2017-04-28 DIAGNOSIS — E1122 Type 2 diabetes mellitus with diabetic chronic kidney disease: Secondary | ICD-10-CM

## 2017-04-28 DIAGNOSIS — IMO0002 Reserved for concepts with insufficient information to code with codable children: Secondary | ICD-10-CM

## 2017-04-28 DIAGNOSIS — E1165 Type 2 diabetes mellitus with hyperglycemia: Secondary | ICD-10-CM | POA: Diagnosis not present

## 2017-04-28 DIAGNOSIS — I739 Peripheral vascular disease, unspecified: Secondary | ICD-10-CM

## 2017-04-28 DIAGNOSIS — I509 Heart failure, unspecified: Secondary | ICD-10-CM | POA: Diagnosis not present

## 2017-04-28 DIAGNOSIS — Z170 Estrogen receptor positive status [ER+]: Secondary | ICD-10-CM

## 2017-04-28 NOTE — Assessment & Plan Note (Signed)
Monitor BP at home, call with results 2-4 weeks, add back exercise, DASH diet. If continues to be elevated will increase hydralazine to TID

## 2017-04-28 NOTE — Patient Instructions (Addendum)
SCHEDULE YOUR EYE EXAM!!  Are you an emotional eater? Do you eat more when you're feeling stressed? Do you eat when you're not hungry or when you're full? Do you eat to feel better (to calm and soothe yourself when you're sad, mad, bored, anxious, etc.)? Do you reward yourself with food? Do you regularly eat until you've stuffed yourself? Does food make you feel safe? Do you feel like food is a friend? Do you feel powerless or out of control around food?  If you answered yes to some of these questions than it is likely that you are an emotional eater. This is normally a learned behavior and can take time to first recognize the signs and second BREAK THE HABIT. But here is more information and tips to help.   The difference between emotional hunger and physical hunger Emotional hunger can be powerful, so it's easy to mistake it for physical hunger. But there are clues you can look for to help you tell physical and emotional hunger apart.  Emotional hunger comes on suddenly. It hits you in an instant and feels overwhelming and urgent. Physical hunger, on the other hand, comes on more gradually. The urge to eat doesn't feel as dire or demand instant satisfaction (unless you haven't eaten for a very long time).  Emotional hunger craves specific comfort foods. When you're physically hungry, almost anything sounds good-including healthy stuff like vegetables. But emotional hunger craves junk food or sugary snacks that provide an instant rush. You feel like you need cheesecake or pizza, and nothing else will do.  Emotional hunger often leads to mindless eating. Before you know it, you've eaten a whole bag of chips or an entire pint of ice cream without really paying attention or fully enjoying it. When you're eating in response to physical hunger, you're typically more aware of what you're doing.  Emotional hunger isn't satisfied once you're full. You keep wanting more and more, often eating until  you're uncomfortably stuffed. Physical hunger, on the other hand, doesn't need to be stuffed. You feel satisfied when your stomach is full.  Emotional hunger isn't located in the stomach. Rather than a growling belly or a pang in your stomach, you feel your hunger as a craving you can't get out of your head. You're focused on specific textures, tastes, and smells.  Emotional hunger often leads to regret, guilt, or shame. When you eat to satisfy physical hunger, you're unlikely to feel guilty or ashamed because you're simply giving your body what it needs. If you feel guilty after you eat, it's likely because you know deep down that you're not eating for nutritional reasons.  Identify your emotional eating triggers What situations, places, or feelings make you reach for the comfort of food? Most emotional eating is linked to unpleasant feelings, but it can also be triggered by positive emotions, such as rewarding yourself for achieving a goal or celebrating a holiday or happy event. Common causes of emotional eating include:  Stuffing emotions - Eating can be a way to temporarily silence or "stuff down" uncomfortable emotions, including anger, fear, sadness, anxiety, loneliness, resentment, and shame. While you're numbing yourself with food, you can avoid the difficult emotions you'd rather not feel.  Boredom or feelings of emptiness - Do you ever eat simply to give yourself something to do, to relieve boredom, or as a way to fill a void in your life? You feel unfulfilled and empty, and food is a way to occupy your mouth and  your time. In the moment, it fills you up and distracts you from underlying feelings of purposelessness and dissatisfaction with your life.  Childhood habits - Think back to your childhood memories of food. Did your parents reward good behavior with ice cream, take you out for pizza when you got a good report card, or serve you sweets when you were feeling sad? These habits can often  carry over into adulthood. Or your eating may be driven by nostalgia-for cherished memories of grilling burgers in the backyard with your dad or baking and eating cookies with your mom.  Social influences - Getting together with other people for a meal is a great way to relieve stress, but it can also lead to overeating. It's easy to overindulge simply because the food is there or because everyone else is eating. You may also overeat in social situations out of nervousness. Or perhaps your family or circle of friends encourages you to overeat, and it's easier to go along with the group.  Stress - Ever notice how stress makes you hungry? It's not just in your mind. When stress is chronic, as it so often is in our chaotic, fast-paced world, your body produces high levels of the stress hormone, cortisol. Cortisol triggers cravings for salty, sweet, and fried foods-foods that give you a burst of energy and pleasure. The more uncontrolled stress in your life, the more likely you are to turn to food for emotional relief.  Find other ways to feed your feelings If you don't know how to manage your emotions in a way that doesn't involve food, you won't be able to control your eating habits for very long. Diets so often fail because they offer logical nutritional advice which only works if you have conscious control over your eating habits. It doesn't work when emotions hijack the process, demanding an immediate payoff with food.  In order to stop emotional eating, you have to find other ways to fulfill yourself emotionally. It's not enough to understand the cycle of emotional eating or even to understand your triggers, although that's a huge first step. You need alternatives to food that you can turn to for emotional fulfillment.  Alternatives to emotional eating If you're depressed or lonely, call someone who always makes you feel better, play with your dog or cat, or look at a favorite photo or cherished  memento.  If you're anxious, expend your nervous energy by dancing to your favorite song, squeezing a stress ball, or taking a brisk walk.  If you're exhausted, treat yourself with a hot cup of tea, take a bath, light some scented candles, or wrap yourself in a warm blanket.  If you're bored, read a good book, watch a comedy show, explore the outdoors, or turn to an activity you enjoy (woodworking, playing the guitar, shooting hoops, scrapbooking, etc.).  What is mindful eating? Mindful eating is a practice that develops your awareness of eating habits and allows you to pause between your triggers and your actions. Most emotional eaters feel powerless over their food cravings. When the urge to eat hits, you feel an almost unbearable tension that demands to be fed, right now. Because you've tried to resist in the past and failed, you believe that your willpower just isn't up to snuff. But the truth is that you have more power over your cravings than you think.  Take 5 before you give in to a craving Emotional eating tends to be automatic and virtually mindless. Before you even  realize what you're doing, you've reached for a tub of ice cream and polished off half of it. But if you can take a moment to pause and reflect when you're hit with a craving, you give yourself the opportunity to make a different decision.  Can you put off eating for five minutes? Or just start with one minute. Don't tell yourself you can't give in to the craving; remember, the forbidden is extremely tempting. Just tell yourself to wait.  While you're waiting, check in with yourself. How are you feeling? What's going on emotionally? Even if you end up eating, you'll have a better understanding of why you did it. This can help you set yourself up for a different response next time.  How to practice mindful eating Eating while you're also doing other things-such as watching TV, driving, or playing with your phone-can prevent  you from fully enjoying your food. Since your mind is elsewhere, you may not feel satisfied or continue eating even though you're no longer hungry. Eating more mindfully can help focus your mind on your food and the pleasure of a meal and curb overeating.   Eat your meals in a calm place with no distractions, aside from any dining companions.  Try eating with your non-dominant hand or using chopsticks instead of a knife and fork. Eating in such a non-familiar way can slow down how fast you eat and ensure your mind stays focused on your food.  Allow yourself enough time not to have to rush your meal. Set a timer for 20 minutes and pace yourself so you spend at least that much time eating.  Take small bites and chew them well, taking time to notice the different flavors and textures of each mouthful.  Put your utensils down between bites. Take time to consider how you feel-hungry, satiated-before picking up your utensils again.  Try to stop eating before you are full.It takes time for the signal to reach your brain that you've had enough. Don't feel obligated to always clean your plate.  When you've finished your food, take a few moments to assess if you're really still hungry before opting for an extra serving or dessert.  Learn to accept your feelings-even the bad ones  While it may seem that the core problem is that you're powerless over food, emotional eating actually stems from feeling powerless over your emotions. You don't feel capable of dealing with your feelings head on, so you avoid them with food.  Recommended reading  Mini habits for weight loss- good book for helping  Healthy Eating: A guide to the new nutrition - [place] Report  10 Tips for Mindful Eating - How mindfulness can help you fully enjoy a meal and the experience of eating-with moderation and restraint. ([place])  Weight Loss: Gain Control of Emotional Eating - Tips to  regain control of your eating habits. [facility])  Why Stress Causes People to Overeat -Tips on controlling stress eating. ([place])  Mindful Eating Meditations -Free online mindfulness meditations. ([employer])    SMART goals Having a solid fitness goal is an amazing way to power you towards success, but not all goals are created equal. While it's great to have an end-game in mind, there are some best practices when it comes to goal setting. Whether you want to lose weight, improve your fitness level, or train for an event, putting the SMART method into action can help you  achieve what you set out to do.  SMART stands for specific, measurable, attainable, relevant, and timely-all of which are important in reaching a fitness objective. SMART goals can help keep you on track and remind you of your priorities, so you're able to follow through with every workout or healthy meal you have planned.   Get SMART and put these five elements into action when you're setting your fitness goal.  1. Specific  You need something that's not too arbitrary. For example, a bad goal would be, say, 'get healthy'. A specific goal would be to lose weight. You'll narrow down that goal even further by using the rest of the method, but whether you want to get stronger, faster, or smaller, having a baseline points you in the right direction.  2. Measurable  Here's where you determine exactly how you'll measure your goal. If you're going to follow the bad goal, it would be get really healthy.That's not quantifiable. A measurable goal would be, say, 'lose 10 pounds'. You can quantify your progress, and you can sort of back into a time frame once you have that. Your goal may be to master a pull-up, run five miles, or go to the gym four days a week-whatever it is, you should have a definite way of knowing when you've reached your goal.  3. Attainable  While it can be helpful to set  big-picture goals in the long-term, you need a more achievable goal on the horizon to keep you on track. You want to start small and see early wins, which encourages long-term consistency.  If you set something too lofty right off the bat, it might be discouraging to not make progress as fast as you would like. You should also consider the size of your goal-for example, a goal of losing 30 pounds in one month just isn't going to happen, so you're better off setting smaller goals that are in closer reach.  4. Relevant  This is where things get a little tricky, finding your "why" is easier said than done. Ask yourself, 'is this goal worthwhile, and am I motivated to do it?' Creating a goal with some type of motivation attached to it, like I want to lose 10 pounds in two months to be ready for my wedding, can give a bit of relevancy to your goal. Whether you want to feel confident at a big event or perform better during everyday activities, pinpoint why a goal is important to you.  5. Timely  You want to be strict about a deadline-doing so creates urgency. It's also important not to set your sights too far out. If you give yourself four months to lose 10 pounds, that might be too long because you aren't incentivized to start working at it immediately. Instead, consider setting smaller goals along the way, like "I want to lose three pounds in two weeks." Maybe running a marathon is your long-term goal, but if you've never been a runner, signing up for one that's a month away isn't realistic-instead, set smaller mileage goals for shorter time periods and work your way up.  You should also be honest with yourself about what you're able to accomplish in a given time frame. You just need to adjust your expectations so they're in line with your schedule and commitments.  Once you have your goal in place, it's all about the follow-through. Whether you want to lose one pound a week, be able to do five full push-ups  in two weeks,  or run a 5K in under 30 minutes in four weeks, you can come up with a plan to help get your where you want to go-but it all starts with deciding what you want. Be accountable to yourself, stay consistent, and the results will follow.  So try the exercise at home and at your next visit come in with your SMART goal and we can discuss how together we can achieve this goal!   Benefiber or Citracel is good for constipation/diarrhea/irritable bowel syndrome, it helps with weight loss and can help lower your bad cholesterol. Please do 1 TBSP in the morning in water, coffee, or tea. It can take up to a month before you can see a difference with your bowel movements. It is cheapest from [employer], [employer], [employer].

## 2017-04-29 LAB — BASIC METABOLIC PANEL WITH GFR
BUN / CREAT RATIO: 19 (calc) (ref 6–22)
BUN: 60 mg/dL — ABNORMAL HIGH (ref 7–25)
CHLORIDE: 108 mmol/L (ref 98–110)
CO2: 27 mmol/L (ref 20–32)
CREATININE: 3.13 mg/dL — AB (ref 0.60–0.93)
Calcium: 9.8 mg/dL (ref 8.6–10.4)
GFR, Est African American: 17 mL/min/{1.73_m2} — ABNORMAL LOW (ref >=60)
GFR, Est Non African American: 14 mL/min/{1.73_m2} — ABNORMAL LOW (ref >=60)
GLUCOSE: 70 mg/dL (ref 65–99)
Potassium: 4.6 mmol/L (ref 3.5–5.3)
SODIUM: 143 mmol/L (ref 135–146)

## 2017-04-29 LAB — HEPATIC FUNCTION PANEL
AG RATIO: 1.7 (calc) (ref 1.0–2.5)
ALKALINE PHOSPHATASE (APISO): 83 U/L (ref 33–130)
ALT: 13 U/L (ref 6–29)
AST: 19 U/L (ref 10–35)
Albumin: 3.8 g/dL (ref 3.6–5.1)
BILIRUBIN INDIRECT: 0.3 mg/dL (ref 0.2–1.2)
Bilirubin, Direct: 0.1 mg/dL (ref 0.0–0.2)
Globulin: 2.3 g/dL (calc) (ref 1.9–3.7)
TOTAL PROTEIN: 6.1 g/dL (ref 6.1–8.1)
Total Bilirubin: 0.4 mg/dL (ref 0.2–1.2)

## 2017-04-29 LAB — CBC WITH DIFFERENTIAL/PLATELET
BASOS PCT: 0.1 %
Basophils Absolute: 10 cells/uL (ref 0–200)
EOS PCT: 2.9 %
Eosinophils Absolute: 287 cells/uL (ref 15–500)
HCT: 36 % (ref 35.0–45.0)
HEMOGLOBIN: 11.8 g/dL (ref 11.7–15.5)
Lymphs Abs: 1901 cells/uL (ref 850–3900)
MCH: 28.8 pg (ref 27.0–33.0)
MCHC: 32.8 g/dL (ref 32.0–36.0)
MCV: 87.8 fL (ref 80.0–100.0)
MONOS PCT: 8.1 %
MPV: 11.2 fL (ref 7.5–12.5)
NEUTROS ABS: 6900 {cells}/uL (ref 1500–7800)
Neutrophils Relative %: 69.7 %
PLATELETS: 226 10*3/uL (ref 140–400)
RBC: 4.1 10*6/uL (ref 3.80–5.10)
RDW: 14.4 % (ref 11.0–15.0)
TOTAL LYMPHOCYTE: 19.2 %
WBC mixed population: 802 cells/uL (ref 200–950)
WBC: 9.9 10*3/uL (ref 3.8–10.8)

## 2017-04-29 LAB — HEMOGLOBIN A1C
Hgb A1c MFr Bld: 7 % of total Hgb — ABNORMAL HIGH (ref <5.7)
Mean Plasma Glucose: 154 (calc)
eAG (mmol/L): 8.5 (calc)

## 2017-04-29 LAB — LIPID PANEL
CHOLESTEROL: 122 mg/dL (ref <200)
HDL: 33 mg/dL — AB (ref >50)
LDL Cholesterol (Calc): 62 mg/dL (calc)
Non-HDL Cholesterol (Calc): 89 mg/dL (calc) (ref <130)
Total CHOL/HDL Ratio: 3.7 (calc) (ref <5.0)
Triglycerides: 207 mg/dL — ABNORMAL HIGH (ref <150)

## 2017-04-29 LAB — MAGNESIUM: Magnesium: 2.4 mg/dL (ref 1.5–2.5)

## 2017-04-29 LAB — TSH: TSH: 2.05 m[IU]/L (ref 0.40–4.50)

## 2017-05-03 ENCOUNTER — Encounter (HOSPITAL_COMMUNITY): Payer: Self-pay | Admitting: Emergency Medicine

## 2017-05-03 ENCOUNTER — Emergency Department (HOSPITAL_COMMUNITY): Payer: Medicare Other

## 2017-05-03 ENCOUNTER — Inpatient Hospital Stay (HOSPITAL_COMMUNITY)
Admission: EM | Admit: 2017-05-03 | Discharge: 2017-05-07 | DRG: 291 | Disposition: A | Discharge status: Home / Self Care | Payer: Medicare Other | Attending: Internal Medicine | Admitting: Internal Medicine

## 2017-05-03 DIAGNOSIS — N184 Chronic kidney disease, stage 4 (severe): Secondary | ICD-10-CM | POA: Diagnosis not present

## 2017-05-03 DIAGNOSIS — Z794 Long term (current) use of insulin: Secondary | ICD-10-CM

## 2017-05-03 DIAGNOSIS — I252 Old myocardial infarction: Secondary | ICD-10-CM | POA: Diagnosis not present

## 2017-05-03 DIAGNOSIS — Z9841 Cataract extraction status, right eye: Secondary | ICD-10-CM

## 2017-05-03 DIAGNOSIS — I130 Hypertensive heart and chronic kidney disease with heart failure and stage 1 through stage 4 chronic kidney disease, or unspecified chronic kidney disease: Secondary | ICD-10-CM | POA: Diagnosis present

## 2017-05-03 DIAGNOSIS — J9601 Acute respiratory failure with hypoxia: Secondary | ICD-10-CM | POA: Diagnosis present

## 2017-05-03 DIAGNOSIS — J810 Acute pulmonary edema: Secondary | ICD-10-CM

## 2017-05-03 DIAGNOSIS — E785 Hyperlipidemia, unspecified: Secondary | ICD-10-CM | POA: Diagnosis present

## 2017-05-03 DIAGNOSIS — Z7982 Long term (current) use of aspirin: Secondary | ICD-10-CM | POA: Diagnosis not present

## 2017-05-03 DIAGNOSIS — R748 Abnormal levels of other serum enzymes: Secondary | ICD-10-CM | POA: Diagnosis present

## 2017-05-03 DIAGNOSIS — E119 Type 2 diabetes mellitus without complications: Secondary | ICD-10-CM | POA: Diagnosis not present

## 2017-05-03 DIAGNOSIS — Z961 Presence of intraocular lens: Secondary | ICD-10-CM | POA: Diagnosis present

## 2017-05-03 DIAGNOSIS — I255 Ischemic cardiomyopathy: Secondary | ICD-10-CM | POA: Diagnosis present

## 2017-05-03 DIAGNOSIS — N183 Chronic kidney disease, stage 3 (moderate): Secondary | ICD-10-CM | POA: Diagnosis not present

## 2017-05-03 DIAGNOSIS — Z923 Personal history of irradiation: Secondary | ICD-10-CM | POA: Diagnosis not present

## 2017-05-03 DIAGNOSIS — Z89421 Acquired absence of other right toe(s): Secondary | ICD-10-CM

## 2017-05-03 DIAGNOSIS — Z8249 Family history of ischemic heart disease and other diseases of the circulatory system: Secondary | ICD-10-CM | POA: Diagnosis not present

## 2017-05-03 DIAGNOSIS — Z853 Personal history of malignant neoplasm of breast: Secondary | ICD-10-CM | POA: Diagnosis not present

## 2017-05-03 DIAGNOSIS — Z803 Family history of malignant neoplasm of breast: Secondary | ICD-10-CM

## 2017-05-03 DIAGNOSIS — M1990 Unspecified osteoarthritis, unspecified site: Secondary | ICD-10-CM | POA: Diagnosis present

## 2017-05-03 DIAGNOSIS — R0602 Shortness of breath: Secondary | ICD-10-CM | POA: Diagnosis not present

## 2017-05-03 DIAGNOSIS — E559 Vitamin D deficiency, unspecified: Secondary | ICD-10-CM | POA: Diagnosis present

## 2017-05-03 DIAGNOSIS — E1022 Type 1 diabetes mellitus with diabetic chronic kidney disease: Secondary | ICD-10-CM | POA: Diagnosis not present

## 2017-05-03 DIAGNOSIS — Z79899 Other long term (current) drug therapy: Secondary | ICD-10-CM | POA: Diagnosis not present

## 2017-05-03 DIAGNOSIS — I2510 Atherosclerotic heart disease of native coronary artery without angina pectoris: Secondary | ICD-10-CM | POA: Diagnosis present

## 2017-05-03 DIAGNOSIS — E11649 Type 2 diabetes mellitus with hypoglycemia without coma: Secondary | ICD-10-CM | POA: Diagnosis not present

## 2017-05-03 DIAGNOSIS — I340 Nonrheumatic mitral (valve) insufficiency: Secondary | ICD-10-CM | POA: Diagnosis not present

## 2017-05-03 DIAGNOSIS — Z91040 Latex allergy status: Secondary | ICD-10-CM

## 2017-05-03 DIAGNOSIS — Z91041 Radiographic dye allergy status: Secondary | ICD-10-CM

## 2017-05-03 DIAGNOSIS — I110 Hypertensive heart disease with heart failure: Secondary | ICD-10-CM | POA: Diagnosis not present

## 2017-05-03 DIAGNOSIS — R0902 Hypoxemia: Secondary | ICD-10-CM

## 2017-05-03 DIAGNOSIS — Z89422 Acquired absence of other left toe(s): Secondary | ICD-10-CM

## 2017-05-03 DIAGNOSIS — E1151 Type 2 diabetes mellitus with diabetic peripheral angiopathy without gangrene: Secondary | ICD-10-CM | POA: Diagnosis present

## 2017-05-03 DIAGNOSIS — N185 Chronic kidney disease, stage 5: Secondary | ICD-10-CM | POA: Diagnosis present

## 2017-05-03 DIAGNOSIS — Z8051 Family history of malignant neoplasm of kidney: Secondary | ICD-10-CM

## 2017-05-03 DIAGNOSIS — E1122 Type 2 diabetes mellitus with diabetic chronic kidney disease: Secondary | ICD-10-CM | POA: Diagnosis present

## 2017-05-03 DIAGNOSIS — N179 Acute kidney failure, unspecified: Secondary | ICD-10-CM | POA: Diagnosis present

## 2017-05-03 DIAGNOSIS — I5031 Acute diastolic (congestive) heart failure: Secondary | ICD-10-CM | POA: Diagnosis not present

## 2017-05-03 DIAGNOSIS — Z808 Family history of malignant neoplasm of other organs or systems: Secondary | ICD-10-CM | POA: Diagnosis not present

## 2017-05-03 DIAGNOSIS — I447 Left bundle-branch block, unspecified: Secondary | ICD-10-CM | POA: Diagnosis present

## 2017-05-03 DIAGNOSIS — D649 Anemia, unspecified: Secondary | ICD-10-CM | POA: Diagnosis not present

## 2017-05-03 DIAGNOSIS — I10 Essential (primary) hypertension: Secondary | ICD-10-CM | POA: Diagnosis not present

## 2017-05-03 DIAGNOSIS — J439 Emphysema, unspecified: Secondary | ICD-10-CM | POA: Diagnosis present

## 2017-05-03 DIAGNOSIS — I509 Heart failure, unspecified: Secondary | ICD-10-CM

## 2017-05-03 DIAGNOSIS — Z888 Allergy status to other drugs, medicaments and biological substances status: Secondary | ICD-10-CM

## 2017-05-03 DIAGNOSIS — E876 Hypokalemia: Secondary | ICD-10-CM | POA: Diagnosis present

## 2017-05-03 DIAGNOSIS — I161 Hypertensive emergency: Secondary | ICD-10-CM

## 2017-05-03 DIAGNOSIS — Z951 Presence of aortocoronary bypass graft: Secondary | ICD-10-CM | POA: Diagnosis not present

## 2017-05-03 DIAGNOSIS — I5043 Acute on chronic combined systolic (congestive) and diastolic (congestive) heart failure: Secondary | ICD-10-CM | POA: Diagnosis present

## 2017-05-03 DIAGNOSIS — M109 Gout, unspecified: Secondary | ICD-10-CM | POA: Diagnosis present

## 2017-05-03 DIAGNOSIS — Z9842 Cataract extraction status, left eye: Secondary | ICD-10-CM

## 2017-05-03 DIAGNOSIS — I5041 Acute combined systolic (congestive) and diastolic (congestive) heart failure: Secondary | ICD-10-CM | POA: Diagnosis not present

## 2017-05-03 DIAGNOSIS — D631 Anemia in chronic kidney disease: Secondary | ICD-10-CM | POA: Diagnosis present

## 2017-05-03 HISTORY — DX: Chronic combined systolic (congestive) and diastolic (congestive) heart failure: I50.42

## 2017-05-03 HISTORY — DX: Chronic kidney disease, stage 4 (severe): N18.4

## 2017-05-03 HISTORY — DX: Left bundle-branch block, unspecified: I44.7

## 2017-05-03 HISTORY — DX: Nonrheumatic mitral (valve) insufficiency: I34.0

## 2017-05-03 LAB — BASIC METABOLIC PANEL
ANION GAP: 14 (ref 5–15)
BUN: 73 mg/dL — ABNORMAL HIGH (ref 6–20)
CHLORIDE: 106 mmol/L (ref 101–111)
CO2: 19 mmol/L — ABNORMAL LOW (ref 22–32)
CREATININE: 2.91 mg/dL — AB (ref 0.44–1.00)
Calcium: 9.3 mg/dL (ref 8.9–10.3)
GFR calc non Af Amer: 15 mL/min — ABNORMAL LOW (ref >60)
GFR, EST AFRICAN AMERICAN: 18 mL/min — AB (ref >60)
Glucose, Bld: 146 mg/dL — ABNORMAL HIGH (ref 65–99)
POTASSIUM: 3.8 mmol/L (ref 3.5–5.1)
SODIUM: 139 mmol/L (ref 135–145)

## 2017-05-03 LAB — CBC WITH DIFFERENTIAL/PLATELET
BASOS ABS: 0 10*3/uL (ref 0.0–0.1)
Basophils Relative: 0 %
EOS PCT: 3 %
Eosinophils Absolute: 0.4 10*3/uL (ref 0.0–0.7)
HCT: 37.1 % (ref 36.0–46.0)
Hemoglobin: 11.9 g/dL — ABNORMAL LOW (ref 12.0–15.0)
LYMPHS PCT: 23 %
Lymphs Abs: 2.7 10*3/uL (ref 0.7–4.0)
MCH: 29.4 pg (ref 26.0–34.0)
MCHC: 32.1 g/dL (ref 30.0–36.0)
MCV: 91.6 fL (ref 78.0–100.0)
Monocytes Absolute: 0.8 10*3/uL (ref 0.1–1.0)
Monocytes Relative: 7 %
Neutro Abs: 8.1 10*3/uL — ABNORMAL HIGH (ref 1.7–7.7)
Neutrophils Relative %: 67 %
PLATELETS: 243 10*3/uL (ref 150–400)
RBC: 4.05 MIL/uL (ref 3.87–5.11)
RDW: 15.6 % — ABNORMAL HIGH (ref 11.5–15.5)
WBC: 12 10*3/uL — AB (ref 4.0–10.5)

## 2017-05-03 LAB — BRAIN NATRIURETIC PEPTIDE: B NATRIURETIC PEPTIDE 5: 784.6 pg/mL — AB (ref 0.0–100.0)

## 2017-05-03 LAB — I-STAT TROPONIN, ED: Troponin i, poc: 0.07 ng/mL (ref 0.00–0.08)

## 2017-05-03 MED ORDER — INSULIN ASPART 100 UNIT/ML ~~LOC~~ SOLN
0.0000 [IU] | SUBCUTANEOUS | Status: DC
  Administered 2017-05-04: 3 [IU] via SUBCUTANEOUS
  Administered 2017-05-04: 1 [IU] via SUBCUTANEOUS
  Administered 2017-05-04: 2 [IU] via SUBCUTANEOUS

## 2017-05-03 MED ORDER — NITROGLYCERIN IN D5W 200-5 MCG/ML-% IV SOLN
0.0000 ug/min | Freq: Once | INTRAVENOUS | Status: AC
  Administered 2017-05-03: 5 ug/min via INTRAVENOUS
  Filled 2017-05-03: qty 250

## 2017-05-03 MED ORDER — FUROSEMIDE 10 MG/ML IJ SOLN
40.0000 mg | INTRAMUSCULAR | Status: AC
  Administered 2017-05-03: 40 mg via INTRAVENOUS
  Filled 2017-05-03: qty 4

## 2017-05-03 MED ORDER — ASPIRIN 81 MG PO CHEW
324.0000 mg | CHEWABLE_TABLET | Freq: Once | ORAL | Status: AC
Start: 2017-05-03 — End: 2017-05-03
  Administered 2017-05-03: 324 mg via ORAL
  Filled 2017-05-03: qty 4

## 2017-05-03 MED ORDER — ALBUTEROL SULFATE (2.5 MG/3ML) 0.083% IN NEBU
5.0000 mg | INHALATION_SOLUTION | Freq: Once | RESPIRATORY_TRACT | Status: AC
  Administered 2017-05-03: 5 mg via RESPIRATORY_TRACT
  Filled 2017-05-03: qty 6

## 2017-05-03 NOTE — ED Provider Notes (Signed)
[facility] EMERGENCY DEPARTMENT Provider Note   CSN: [id number] Arrival date & time: 05/03/17  1916     History   Chief Complaint Chief Complaint  Patient presents with  . Shortness of Breath  . Chest Pain    HPI [name] is a 72 y.o. female.  [name] is a 72 y.o. Female who presents to the emergency department complaining of shortness of breath and chest pain starting today.  Patient reports her symptoms started this afternoon and progressed rapidly.  Patient reports she has had a chest cold without fevers for the past 5 days.  She reports this afternoon she had increasing shortness of breath and some mild chest pressure.  She also reports significant swelling to her feet and legs.  She reports she does not normally have swelling to her legs.  She denies history of CHF.  She is not on any diuretics.  She does report history of coronary artery disease with an NSTEMI and ischemic cardiomyopathy.  No treatments prior to arrival.  She denies fevers, syncope, hemoptysis, urinary symptoms, vomiting, diarrhea, abdominal pain or rashes.   The history is provided by the patient and medical records. No language interpreter was used.  Shortness of Breath  Associated symptoms include rhinorrhea, cough, chest pain and leg swelling. Pertinent negatives include no fever, no headaches, no sore throat, no neck pain, no wheezing, no vomiting, no abdominal pain and no rash.  Chest Pain   Associated symptoms include cough and shortness of breath. Pertinent negatives include no abdominal pain, no back pain, no fever, no headaches, no nausea, no numbness, no palpitations and no vomiting.    Past Medical History:  Diagnosis Date  . Allergy   . Arthritis    "maybe in my lower back" (08/27/2012)  . 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 CKD (chronic kidney disease), stage I   . Coronary artery disease    NSTEMI in  05/2009;  LHC with 2 v CAD - > CABG (LIMA-LAD, SVG-diagonal, SVG-OM1/OM 2).   . Family history of anesthesia complication    Mom has a hard time to wake up  . Foot ulcer ([place])    "I've had them on both feet" (08/27/2012)  . Gouty arthritis    "right index finger" (08/27/2012)  . History of radiation therapy 06/09/13-07/06/13   left breast 50Gy  . Hyperlipidemia   . Hypertension   . Infection    right second toe  . Ischemic cardiomyopathy    Echo 07/2009: Mild LVH, EF 40-45%, inferior and posterior HK, grade 1 diastolic dysfunction, moderate MAC, mild to moderate MR, mild LAE  . Neuropathy    Hx; of B/L feet  . NSTEMI (non-ST elevated myocardial infarction) ([place]) 05/23/2009   Archie Endo 05/23/2009 (08/27/2012)  . Osteomyelitis of foot ([place])   . PAD (peripheral artery disease) ([facility])    bilateral legs  . PONV (postoperative nausea and vomiting)   . Sinus headache    "occasionally" (08/27/2012)  . Type II diabetes mellitus ([place])   . Type II diabetes mellitus with neurological manifestations, uncontrolled ([place]) 02/17/2014  . Type II or unspecified type diabetes mellitus without mention of complication, not stated as uncontrolled   . Vitamin D deficiency     Patient Active Problem List   Diagnosis Date Noted  . CHF (congestive heart failure) ([place]) 05/03/2017  . Anemia 05/03/2017  . CKD stage 3 due to type 2 diabetes mellitus ([place])  05/08/2015  . Diabetes mellitus type 2, insulin dependent ([place]) 05/08/2015  . BMI 29.0-29.9,adult 02/14/2015  . Medication management 08/04/2014  . Type II diabetes mellitus with neurological manifestations, uncontrolled ([place]) 02/17/2014  . Gouty arthritis   . Vitamin D deficiency   . CKD (chronic kidney disease) stage 3, GFR 30-59 ml/min ([facility]) 06/04/2013  . Malignant neoplasm of upper-inner quadrant of left breast in female, estrogen receptor positive ([place]) 02/14/2013  . Peripheral vascular disease ([place]) 09/05/2009  . Hyperlipidemia 06/20/2009  . Essential  hypertension 06/20/2009  . Coronary atherosclerosis 06/20/2009  . Congestive heart failure ([place]) 06/20/2009  . EMPHYSEMA 06/20/2009  . Asthma 06/20/2009    Past Surgical History:  Procedure Laterality Date  . AMPUTATION Right 08/27/2012   Procedure: AMPUTATION RAY;  Surgeon: Newt Minion, MD;  Location: [facility];  Service: Orthopedics;  Laterality: Right;  Right Foot 5th Ray Amputation  . AMPUTATION Left 07/08/2013   Procedure: AMPUTATION RAY;  Surgeon: Newt Minion, MD;  Location: [facility];  Service: Orthopedics;  Laterality: Left;  Left Foot 2nd Ray Amputation  . AMPUTATION RAY Right 08/27/2012   5th ray/notes 08/27/2012  . AMPUTATION TOE Right 03/06/2017   Procedure: AMPUTATION TOE, INTERPHANGEAL 2ND RIGHT;  Surgeon: [name], DPM;  Location: [facility];  Service: Podiatry;  Laterality: Right;  . BREAST LUMPECTOMY  04/20/2013   with biopsy      DR WAKEFIELD  . BREAST LUMPECTOMY WITH NEEDLE LOCALIZATION AND AXILLARY SENTINEL LYMPH NODE BX Left 04/20/2013   Procedure: LEFT BREAST WIRE GUIDED LUMPECTOMY AND AXILLARY SENTINEL LYMPH NODE BX;  Surgeon: Rolm Bookbinder, MD;  Location: [facility];  Service: General;  Laterality: Left;  . CARDIAC CATHETERIZATION  05/24/2009   Archie Endo 05/24/2009 (08/27/2012)  . CATARACT EXTRACTION W/ INTRAOCULAR LENS  IMPLANT, BILATERAL  2000's  . COLONOSCOPY W/ BIOPSIES AND POLYPECTOMY  2010  . CORONARY ARTERY BYPASS GRAFT  2011   "CABG X4" (08/27/2012)  . DENTAL SURGERY  04/30/11   "1 implant" (08/27/2012)  . DILATION AND CURETTAGE OF UTERUS    . EYE SURGERY    . FINGER SURGERY Right    "index finger; turned out to be gout" (08/27/2012)    OB History    No data available       Home Medications    Prior to Admission medications   Medication Sig Start Date End Date Taking? Authorizing Provider  allopurinol (ZYLOPRIM) 100 MG tablet TAKE 1 TABLET TWICE DAILY Patient taking differently: Take 100 mg by mouth two times a day 12/22/16  Yes Unk Pinto, MD    anastrozole (ARIMIDEX) 1 MG tablet Take 1 tablet (1 mg total) by mouth daily. 02/02/17  Yes Magrinat, Virgie Dad, MD  aspirin 325 MG tablet Take 325 mg by mouth at bedtime.    Yes [provider]  aspirin-acetaminophen-caffeine (EXCEDRIN MIGRAINE) [phone] MG tablet Take 1 tablet by mouth daily as needed (for sinus headaches.).   Yes [provider]  beta carotene w/minerals (OCUVITE) tablet Take 1 tablet by mouth daily.   Yes [provider]  carvedilol (COREG) 12.5 MG tablet TAKE 1 TABLET TWICE DAILY FOR BLOOD PRESSURE Patient taking differently: Take 12.5 mg by mouth two times a day 12/17/16  Yes Unk Pinto, MD  Cholecalciferol (VITAMIN D3) 2000 units TABS Take 2,000 Units by mouth daily.    Yes [provider]  docusate sodium (COLACE) 100 MG capsule Take 100 mg by mouth at bedtime.   Yes [provider]  fluticasone Asencion Islam)  50 MCG/ACT nasal spray Place 2 sprays into both nostrils daily as needed for allergies or rhinitis.   Yes [provider]  hydrALAZINE (APRESOLINE) 50 MG tablet Take 50 mg by mouth 2 (two) times daily.   Yes [provider]  hydrochlorothiazide (HYDRODIURIL) 12.5 MG tablet TAKE 1 TABLET EVERY DAY Patient taking differently: Take 12.5 mg by mouth once a day 08/22/16  Yes Unk Pinto, MD  insulin NPH-regular Human (NOVOLIN 70/30) (70-30) 100 UNIT/ML injection Inject 30-65 Units into the skin See admin instructions. 60-65 units into the skin in the morning before breakfast & 30-35 units in the evening, depending on BGL   Yes [provider]  [place] Tonic and Homeopathic Remedy: Mix 1 ounce with water and drink one to two times a day (5-6 times a week)   Yes [provider]  Polyethyl Glycol-Propyl Glycol (LUBRICANT EYE DROPS) 0.4-0.3 % SOLN Place 1-2 drops into both eyes 3 (three) times daily as needed (for dry eyes.).   Yes [provider]  rosuvastatin  (CRESTOR) 20 MG tablet TAKE 1 TABLET EVERY DAY Patient taking differently: Take 20 mg by mouth in the evening 01/12/17  Yes Vicie Mutters, PA-C    Family History Family History  Problem Relation Age of Onset  . Kidney cancer Brother 65  . Hypertension Brother   . Breast cancer Sister 67       LCIS; BRCA negative  . Throat cancer Father 87       smoker  . Breast cancer Sister 51       Lobular breast cancer  . Breast cancer Maternal Aunt        dx in her 63s    Social History Social History   Tobacco Use  . Smoking status: Never Smoker  . Smokeless tobacco: Never Used  Substance Use Topics  . Alcohol use: Yes    Comment: rarely drinks wine  . Drug use: No     Allergies   Atenolol; Contrast media [iodinated diagnostic agents]; Latex; and Omnipaque [iohexol]   Review of Systems Review of Systems  Constitutional: Negative for chills and fever.  HENT: Positive for congestion, postnasal drip and rhinorrhea. Negative for sore throat.   Eyes: Negative for visual disturbance.  Respiratory: Positive for cough and shortness of breath. Negative for wheezing.   Cardiovascular: Positive for chest pain and leg swelling. Negative for palpitations.  Gastrointestinal: Negative for abdominal pain, diarrhea, nausea and vomiting.  Genitourinary: Negative for dysuria.  Musculoskeletal: Negative for back pain and neck pain.  Skin: Negative for rash.  Neurological: Negative for syncope, numbness and headaches.     Physical Exam Updated Vital Signs BP (!) 171/74   Pulse 77   Temp 97.8 F (36.6 C) (Oral)   Resp 18   SpO2 99%   Physical Exam  Constitutional: She is oriented to person, place, and time. She appears well-developed and well-nourished.  Non-toxic appearance. She appears distressed.  HENT:  Head: Normocephalic and atraumatic.  Mouth/Throat: Oropharynx is clear and moist.  Eyes: Conjunctivae are normal. Pupils are equal, round, and reactive to light. Right eye exhibits  no discharge. Left eye exhibits no discharge.  Neck: Neck supple. JVD present.  Cardiovascular: Normal rate, regular rhythm, normal heart sounds and intact distal pulses. Exam reveals no gallop and no friction rub.  No murmur heard. Pulmonary/Chest: She is in respiratory distress. She has decreased breath sounds.  Patient with increased work of breathing.  Patient still speaking in  complete sentences.  Crackles and diminished lung sounds bilaterally.  Abdominal: Soft. There is no tenderness.  Musculoskeletal:       Right lower leg: She exhibits edema.       Left lower leg: She exhibits edema.  Pitting bilateral lower extremity edema.  Lymphadenopathy:    She has no cervical adenopathy.  Neurological: She is alert and oriented to person, place, and time. Coordination normal.  Skin: Skin is warm and dry. Capillary refill takes less than 2 seconds. No rash noted. She is not diaphoretic. No erythema. No pallor.  Psychiatric: She has a normal mood and affect. Her behavior is normal.  Nursing note and vitals reviewed.    ED Treatments / Results  Labs (all labs ordered are listed, but only abnormal results are displayed) Labs Reviewed  BASIC METABOLIC PANEL - Abnormal; Notable for the following components:      Result Value   CO2 19 (*)    Glucose, Bld 146 (*)    BUN 73 (*)    Creatinine, Ser 2.91 (*)    GFR calc non Af Amer 15 (*)    GFR calc Af Amer 18 (*)    All other components within normal limits  BRAIN NATRIURETIC PEPTIDE - Abnormal; Notable for the following components:   B Natriuretic Peptide 784.6 (*)    All other components within normal limits  CBC WITH DIFFERENTIAL/PLATELET - Abnormal; Notable for the following components:   WBC 12.0 (*)    Hemoglobin 11.9 (*)    RDW 15.6 (*)    Neutro Abs 8.1 (*)    All other components within normal limits  INFLUENZA PANEL BY PCR (TYPE A & B)  LIPID PANEL  I-STAT TROPONIN, ED    EKG  EKG Interpretation  Date/Time:  Sunday  May 03 2017 19:22:08 EST Ventricular Rate:  83 PR Interval:  182 QRS Duration: 152 QT Interval:  448 QTC Calculation: 526 R Axis:   -26 Text Interpretation:  Normal sinus rhythm Possible Left atrial enlargement Left bundle branch block Abnormal ECG No significant change since last tracing Confirmed by Zenovia Jarred [phone]) on 05/03/2017 8:32:03 PM       Radiology Dg Chest 2 View  Result Date: 05/03/2017 CLINICAL DATA:  Patient with shortness of breath and chest tightness. EXAM: CHEST  2 VIEW COMPARISON:  Chest radiograph 04/12/2013 FINDINGS: Monitoring leads overlie the patient. Stable cardiomegaly status post median sternotomy. Aortic atherosclerosis. Bilateral interstitial pulmonary opacities. Small bilateral pleural effusions. Thoracic spine degenerative changes. IMPRESSION: Cardiomegaly with interstitial opacities bilaterally favored to represent edema. Atypical infection could appear similar. Electronically Signed   By: Lovey Newcomer M.D.   On: 05/03/2017 20:50    Procedures .Critical Care Performed by: Waynetta Pean, PA-C Authorized by: Waynetta Pean, PA-C      CRITICAL CARE Performed by: Hanley Hays   Total critical care time: 40 minutes  Critical care time was exclusive of separately billable procedures and treating other patients.  Critical care was necessary to treat or prevent imminent or life-threatening deterioration.  Critical care was time spent personally by me on the following activities: development of treatment plan with patient and/or surrogate as well as nursing, discussions with consultants, evaluation of patient's response to treatment, examination of patient, obtaining history from patient or surrogate, ordering and performing treatments and interventions, ordering and review of laboratory studies, ordering and review of radiographic studies, pulse oximetry and re-evaluation of patient's condition.   Medications Ordered in  ED Medications  insulin aspart (novoLOG)  injection 0-9 Units (not administered)  albuterol (PROVENTIL) (2.5 MG/3ML) 0.083% nebulizer solution 5 mg (5 mg Nebulization Given 05/03/17 1936)  nitroGLYCERIN 50 mg in dextrose 5 % 250 mL (0.2 mg/mL) infusion (10 mcg/min Intravenous Rate/Dose Change 05/03/17 2121)  furosemide (LASIX) injection 40 mg (40 mg Intravenous Given 05/03/17 2039)  aspirin chewable tablet 324 mg (324 mg Oral Given 05/03/17 2039)     Initial Impression / Assessment and Plan / ED Course  I have reviewed the triage vital signs and the nursing notes.  Pertinent labs & imaging results that were available during my care of the patient were reviewed by me and considered in my medical decision making (see chart for details).     This  is a 72 y.o. Female who presents to the emergency department complaining of shortness of breath and chest pain starting today.  Patient reports her symptoms started this afternoon and progressed rapidly.  Patient reports she has had a chest cold without fevers for the past 5 days.  She reports this afternoon she had increasing shortness of breath and some mild chest pressure.  She also reports significant swelling to her feet and legs.  She reports she does not normally have swelling to her legs.  She denies history of CHF.  She is not on any diuretics.  She does report history of coronary artery disease with an NSTEMI and ischemic cardiomyopathy.   On exam the patient is in respiratory distress.  She has increased work of breathing.  Her oxygen saturation is 88% on 2 L via nasal cannula.  I increased this to 4 L via nasal cannula and patient's oxygen saturation went up to 91%.  She does not use oxygen at home. BiPAP ordered immediately.  Patient is able to speak in complete sentences, however she does have increased work of breathing.  Crackles noted bilaterally. Bilateral LE pitting edema. She is hypertensive with a SBP of 204. Recent Echo in Jan of this year  showed normal EF on chart review.  Orders placed for blood work, CXR, nitro drip, 40 mg IV lasix, and ASA 324.  Chest x-ray shows cardiomegaly with pulmonary edema.  Atypical infection could also appear similar.  She is afebrile currently.  BNP is also elevated at 700.  This appears to be CHF exacerbation with pulmonary edema. Troponin is not elevated.  CBC is remarkable for a white count of 12,000.  Metabolic panel shows a creatinine around her baseline at 2.91.   At second reevaluation after time on Bipap patient reports she is feeling much better.  Nitro drip is improving her blood pressure down to the 948 systolic and breathing is much better.  Oxygen saturation is 99% on BiPAP. Plan for admission. Patient and family agree with plan.   I consulted with hospitalist Dr. Maudie Mercury who accepted the patient for admission.   This patient was discussed with and evaluated by Dr. Thomasene Lot who agrees with assessment and plan.   Final Clinical Impressions(s) / ED Diagnoses   Final diagnoses:  Acute congestive heart failure, unspecified heart failure type [facility])  Hypoxia    ED Discharge Orders    None       Waynetta Pean, PA-C 05/03/17 2248    Macarthur Critchley, MD 05/06/17 [phone]

## 2017-05-03 NOTE — ED Notes (Signed)
Patient transported to X-ray 

## 2017-05-03 NOTE — ED Triage Notes (Signed)
Reports having chest pressure for a couple of days with episodes of SOB relieved by sitting up.  Reports swelling to feet and worsening SOB today.

## 2017-05-03 NOTE — H&P (Signed)
TRH H&P   Patient Demographics:    [name], is a 72 y.o. female  MRN: [id number]   DOB - [birth year]  Admit Date - 05/03/2017  Outpatient Primary MD for the patient is Unk Pinto, MD  Referring MD/NP/PA: Edwena Blow  Outpatient Specialists:    Jenkins Rouge (cardiology) Celesta Gentile (podiatry)  Patient coming from:  home  Chief Complaint  Patient presents with  . Shortness of Breath  . Chest Pain      HPI:    [name]  is a 72 y.o. female, w hypertension, hyperlipidemia, Dm2, CKD stage4, L breast cancer s/p lumpectomy/XRT, CHF (dialstolic), NSTEMI in 06/2990 s/p CABG ([place]->LAD, SVG->diag, SVG ->OM1/OM2),  PVD, apparently presents w c/o dyspnea starting about 3 days ago.  Pt notes orthopnea.  Slight weight gain about 5-7 lbs recently as well as pedal edema. Pt noted slight chest discomfort a few days ago. None presently  Her bp has been running high at home.  Pt presented due to dyspnea   In ED,   CXR IMPRESSION: Cardiomegaly with interstitial opacities bilaterally favored to represent edema. Atypical infection could appear similar.   EKG nsr at 85, borderline LAD, LBBB, Q in v1-3, st depression (mild), v5,6  Trop I 0.07  Na 139, K 3.8, Bun 73, Creatinine 2.91 Hco3 19 Wbc 12.0, Hgb 11.9, Plt 243 BNP 784.6   Pt will be admitted for dyspnea secondary to Acute DIastolic CHF    Review of systems:    In addition to the HPI above, No Fever-chills, No Headache, No changes with Vision or hearing, No problems swallowing food or Liquids, No Chest pain, Cough  No Abdominal pain, No Nausea or Vommitting, Bowel movements are regular, No Blood in stool or Urine, No dysuria, No new skin rashes or bruises, No new joints pains-aches,  No new weakness, tingling, numbness in any extremity, No recent weight gain or loss, No polyuria, polydypsia or  polyphagia, No significant Mental Stressors.  A full 10 point Review of Systems was done, except as stated above, all other Review of Systems were negative.   With Past History of the following :    Past Medical History:  Diagnosis Date  . Allergy   . Arthritis    "maybe in my lower back" (08/27/2012)  . 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 CKD (chronic kidney disease), stage I   . Coronary artery disease    NSTEMI in 05/2009;  LHC with 2 v CAD - > CABG (LIMA-LAD, SVG-diagonal, SVG-OM1/OM 2).   . Family history of anesthesia complication    Mom has a hard time to wake up  . Foot ulcer ([place])    "I've had them on both feet" (08/27/2012)  . Gouty arthritis    "right index finger" (08/27/2012)  . History of radiation therapy 06/09/13-07/06/13   left breast  50Gy  . Hyperlipidemia   . Hypertension   . Infection    right second toe  . Ischemic cardiomyopathy    Echo 07/2009: Mild LVH, EF 40-45%, inferior and posterior HK, grade 1 diastolic dysfunction, moderate MAC, mild to moderate MR, mild LAE  . Neuropathy    Hx; of B/L feet  . NSTEMI (non-ST elevated myocardial infarction) ([place]) 05/23/2009   Archie Endo 05/23/2009 (08/27/2012)  . Osteomyelitis of foot ([place])   . PAD (peripheral artery disease) ([facility])    bilateral legs  . PONV (postoperative nausea and vomiting)   . Sinus headache    "occasionally" (08/27/2012)  . Type II diabetes mellitus ([place])   . Type II diabetes mellitus with neurological manifestations, uncontrolled ([place]) 02/17/2014  . Type II or unspecified type diabetes mellitus without mention of complication, not stated as uncontrolled   . Vitamin D deficiency       Past Surgical History:  Procedure Laterality Date  . AMPUTATION Right 08/27/2012   Procedure: AMPUTATION RAY;  Surgeon: Newt Minion, MD;  Location: [facility];  Service: Orthopedics;  Laterality: Right;  Right Foot 5th Ray Amputation  . AMPUTATION Left 07/08/2013    Procedure: AMPUTATION RAY;  Surgeon: Newt Minion, MD;  Location: [facility];  Service: Orthopedics;  Laterality: Left;  Left Foot 2nd Ray Amputation  . AMPUTATION RAY Right 08/27/2012   5th ray/notes 08/27/2012  . AMPUTATION TOE Right 03/06/2017   Procedure: AMPUTATION TOE, INTERPHANGEAL 2ND RIGHT;  Surgeon: [name], DPM;  Location: [facility];  Service: Podiatry;  Laterality: Right;  . BREAST LUMPECTOMY  04/20/2013   with biopsy      DR WAKEFIELD  . BREAST LUMPECTOMY WITH NEEDLE LOCALIZATION AND AXILLARY SENTINEL LYMPH NODE BX Left 04/20/2013   Procedure: LEFT BREAST WIRE GUIDED LUMPECTOMY AND AXILLARY SENTINEL LYMPH NODE BX;  Surgeon: Rolm Bookbinder, MD;  Location: [facility];  Service: General;  Laterality: Left;  . CARDIAC CATHETERIZATION  05/24/2009   Archie Endo 05/24/2009 (08/27/2012)  . CATARACT EXTRACTION W/ INTRAOCULAR LENS  IMPLANT, BILATERAL  2000's  . COLONOSCOPY W/ BIOPSIES AND POLYPECTOMY  2010  . CORONARY ARTERY BYPASS GRAFT  2011   "CABG X4" (08/27/2012)  . DENTAL SURGERY  04/30/11   "1 implant" (08/27/2012)  . DILATION AND CURETTAGE OF UTERUS    . EYE SURGERY    . FINGER SURGERY Right    "index finger; turned out to be gout" (08/27/2012)      Social History:     Social History   Tobacco Use  . Smoking status: Never Smoker  . Smokeless tobacco: Never Used  Substance Use Topics  . Alcohol use: Yes    Comment: rarely drinks wine     Lives - at home  Mobility - walks by self   Family History :     Family History  Problem Relation Age of Onset  . Kidney cancer Brother 48  . Hypertension Brother   . Breast cancer Sister 19       LCIS; BRCA negative  . Throat cancer Father 69       smoker  . Breast cancer Sister 28       Lobular breast cancer  . Breast cancer Maternal Aunt        dx in her 55s      Home Medications:   Prior to Admission medications   Medication Sig Start Date End Date Taking? Authorizing Provider  allopurinol (ZYLOPRIM) 100 MG tablet TAKE 1  TABLET TWICE DAILY  12/22/16   Unk Pinto, MD  anastrozole (ARIMIDEX) 1 MG tablet Take 1 tablet (1 mg total) by mouth daily. 02/02/17   Magrinat, Virgie Dad, MD  aspirin 325 MG tablet Take 325 mg by mouth at bedtime.     [provider]  aspirin-acetaminophen-caffeine (EXCEDRIN MIGRAINE) [phone] MG tablet Take 1 tablet by mouth daily as needed (for sinus headaches.).    [provider]  beta carotene w/minerals (OCUVITE) tablet Take 1 tablet by mouth daily.    [provider]  carvedilol (COREG) 12.5 MG tablet TAKE 1 TABLET TWICE DAILY FOR BLOOD PRESSURE 12/17/16   Unk Pinto, MD  Cholecalciferol (VITAMIN D3) 2000 units TABS Take 2,000 Units by mouth daily. On hold due to upcoming procedure    [provider]  docusate sodium (COLACE) 100 MG capsule Take 100 mg by mouth at bedtime.    [provider]  hydrALAZINE (APRESOLINE) 50 MG tablet Take 50 mg by mouth 2 (two) times daily.    [provider]  hydrochlorothiazide (HYDRODIURIL) 12.5 MG tablet TAKE 1 TABLET EVERY DAY 08/22/16   Unk Pinto, MD  insulin NPH-regular Human (NOVOLIN 70/30) (70-30) 100 UNIT/ML injection Inject 30-65 Units into the skin 2 (two) times daily. Use 60-65 units in the morning & 30-35 units in the evening (varies based on blood sugar)    [provider]  Polyethyl Glycol-Propyl Glycol (LUBRICANT EYE DROPS) 0.4-0.3 % SOLN Place 1-2 drops into both eyes 3 (three) times daily as needed (for dry eyes.).    [provider]  rosuvastatin (CRESTOR) 20 MG tablet TAKE 1 TABLET EVERY DAY Patient taking differently: TAKE 1 TABLET (20 MG) EVERY DAY IN THE EVENING. 01/12/17   Vicie Mutters, PA-C     Allergies:     Allergies  Allergen Reactions  . Atenolol Other (See Comments)    Exacerbates gout  . Contrast Media [Iodinated Diagnostic Agents] Rash  . Latex Rash  . Omnipaque [Iohexol] Rash     Physical Exam:   Vitals  Blood pressure (!)  182/85, pulse 81, temperature 97.8 F (36.6 C), temperature source Oral, resp. rate (!) 28, SpO2 100 %.   1. General  lying in bed in NAD,    2. Normal affect and insight, Not Suicidal or Homicidal, Awake Alert, Oriented X 3.  3. No F.N deficits, ALL C.Nerves Intact, Strength 5/5 all 4 extremities, Sensation intact all 4 extremities, Plantars down going.  4. Ears and Eyes appear Normal, Conjunctivae clear, PERRLA. Moist Oral Mucosa.  5. Supple Neck, No JVD, No cervical lymphadenopathy appriciated, No Carotid Bruits.  6. Symmetrical Chest wall movement crackes right > left base, no wheezing.   7. RRR, No Gallops, Rubs or Murmurs, No Parasternal Heave.  8. Positive Bowel Sounds, Abdomen Soft, No tenderness, No organomegaly appriciated,No rebound -guarding or rigidity.  9.  No Cyanosis, Normal Skin Turgor, No Skin Rash or Bruise.  10. Good muscle tone,  joints appear normal , no effusions, Normal ROM.  11. No Palpable Lymph Nodes in Neck or Axillae     Data Review:    CBC Recent Labs  Lab 04/28/17 1154 05/03/17 1951  WBC 9.9 12.0*  HGB 11.8 11.9*  HCT 36.0 37.1  PLT 226 243  MCV 87.8 91.6  MCH 28.8 29.4  MCHC 32.8 32.1  RDW 14.4 15.6*  LYMPHSABS 1,901 2.7  MONOABS  --  0.8  EOSABS 287 0.4  BASOSABS 10 0.0   ------------------------------------------------------------------------------------------------------------------  Chemistries  Recent Labs  Lab 04/28/17  1154 05/03/17 1951  NA 143 139  K 4.6 3.8  CL 108 106  CO2 27 19*  GLUCOSE 70 146*  BUN 60* 73*  CREATININE 3.13* 2.91*  CALCIUM 9.8 9.3  MG 2.4  --   AST 19  --   ALT 13  --   BILITOT 0.4  --    ------------------------------------------------------------------------------------------------------------------ estimated creatinine clearance is 18.8 mL/min (A) (by C-G formula based on SCr of 2.91 mg/dL  (H)). ------------------------------------------------------------------------------------------------------------------ No results for input(s): TSH, T4TOTAL, T3FREE, THYROIDAB in the last 72 hours.  Invalid input(s): FREET3  Coagulation profile No results for input(s): INR, PROTIME in the last 168 hours. ------------------------------------------------------------------------------------------------------------------- No results for input(s): DDIMER in the last 72 hours. -------------------------------------------------------------------------------------------------------------------  Cardiac Enzymes No results for input(s): CKMB, TROPONINI, MYOGLOBIN in the last 168 hours.  Invalid input(s): CK ------------------------------------------------------------------------------------------------------------------    Component Value Date/Time   BNP 784.6 (H) 05/03/2017 1951     ---------------------------------------------------------------------------------------------------------------  Urinalysis    Component Value Date/Time   COLORURINE YELLOW 01/22/2017 [place] 01/22/2017 1208   LABSPEC 1.016 01/22/2017 1208   PHURINE 5.5 01/22/2017 1208   GLUCOSEU NEGATIVE 01/22/2017 1208   HGBUR NEGATIVE 01/22/2017 [place] 10/13/2016 1138   Ensign 01/22/2017 1208   PROTEINUR 3+ (A) 01/22/2017 1208   UROBILINOGEN 0.2 05/30/2009 1500   NITRITE NEGATIVE 01/22/2017 1208   LEUKOCYTESUR NEGATIVE 01/22/2017 1208    ----------------------------------------------------------------------------------------------------------------   Imaging Results:    Dg Chest 2 View  Result Date: 05/03/2017 CLINICAL DATA:  Patient with shortness of breath and chest tightness. EXAM: CHEST  2 VIEW COMPARISON:  Chest radiograph 04/12/2013 FINDINGS: Monitoring leads overlie the patient. Stable cardiomegaly status post median sternotomy. Aortic atherosclerosis.  Bilateral interstitial pulmonary opacities. Small bilateral pleural effusions. Thoracic spine degenerative changes. IMPRESSION: Cardiomegaly with interstitial opacities bilaterally favored to represent edema. Atypical infection could appear similar. Electronically Signed   By: Lovey Newcomer M.D.   On: 05/03/2017 20:50       Assessment & Plan:    Principal Problem:   CHF (congestive heart failure) (HCC) Active Problems:   CKD stage 3 due to type 2 diabetes mellitus (HCC)   Diabetes mellitus type 2, insulin dependent (HCC)   Anemia    Acute (diastolic) CHF Tele Trop I q6h x3 Check tsh Check cardiac echo Cont nitro GTT Cont hydralazine, increase to 19m po tid for improved bp control Cont carvedilol Start Lasix 424miv bid Cardiology consult requested by email  Troponin elevation Tele Trop I q6h x3 Cardiology to please comment.   CAD s/p CABG Cont aspirin Cont carvedilol Cont crestor Check lipid  CKD stage 4 Consider nephrology ([facility] Lipsconsult Check cmp in am  Hypertension Increase hydralazine  Anemia Check cbc in am  DVT Prophylaxis Heparin  - SCDs   AM Labs Ordered, also please review Full Orders  Family Communication: Admission, patients condition and plan of care including tests being ordered have been discussed with the patient  who indicate understanding and agree with the plan and Code Status.  Code Status  FULL CODE  Likely DC to  home  Condition GUARDED    Consults called: cardiology by email  Admission status: inpatient  Time spent in minutes : 45   JaJani [email or web address].D on 05/03/2017 at 9:38 PM  Between 7am to 7pm - Pager - [phone]. After 7pm go to [email or web address] - password [email or web address] - Office  [phone]

## 2017-05-04 ENCOUNTER — Inpatient Hospital Stay (HOSPITAL_COMMUNITY): Payer: Medicare Other

## 2017-05-04 ENCOUNTER — Other Ambulatory Visit: Payer: Self-pay

## 2017-05-04 DIAGNOSIS — E1122 Type 2 diabetes mellitus with diabetic chronic kidney disease: Secondary | ICD-10-CM

## 2017-05-04 DIAGNOSIS — I340 Nonrheumatic mitral (valve) insufficiency: Secondary | ICD-10-CM

## 2017-05-04 DIAGNOSIS — N183 Chronic kidney disease, stage 3 (moderate): Secondary | ICD-10-CM

## 2017-05-04 LAB — ECHOCARDIOGRAM COMPLETE
Ao-asc: 27 cm
Area-P 1/2: 3.55 cm2
E/e' ratio: 34.2
EWDT: 155 ms
FS: 19 % — AB (ref 28–44)
HEIGHTINCHES: 65 in
IVS/LV PW RATIO, ED: 1.06
LA ID, A-P, ES: 27 mm
LA vol A4C: 50.8 ml
LA vol index: 30.1 mL/m2
LA vol: 56.8 mL
LADIAMINDEX: 1.43 cm/m2
LEFT ATRIUM END SYS DIAM: 27 mm
LV E/e' medial: 34.2
LV E/e'average: 34.2
LV PW d: 12.5 mm — AB (ref 0.6–1.1)
LV SIMPSON'S DISK: 47
LV TDI E'LATERAL: 5
LV TDI E'MEDIAL: 4.57
LV sys vol index: 21 mL/m2
LV sys vol: 39 mL (ref 14–42)
LVDIAVOL: 74 mL (ref 46–106)
LVDIAVOLIN: 39 mL/m2
LVELAT: 5 cm/s
LVOT MV VTI INDEX: 0.52 cm2/m2
LVOT MV VTI: 0.99
LVOT VTI: 21.2 cm
LVOT area: 2.27 cm2
LVOT diameter: 17 mm
LVOT peak grad rest: 5 mmHg
LVOTPV: 116 cm/s
LVOTSV: 48 mL
MV Dec: 155
MV M vel: 115
MV Peak grad: 12 mmHg
MV VTI: 201 cm
MV pk E vel: 171 m/s
MVANNULUSVTI: 48.4 cm
MVPKAVEL: 140 m/s
Mean grad: 7 mmHg
P 1/2 time: 77 ms
PISA EROA: 0.14 cm2
RV LATERAL S' VELOCITY: 8.59 cm/s
S' Lateral: 5.66 cm/s
Stroke v: 35 ml
TAPSE: 13.8 mm
WEIGHTICAEL: 2886.4 [oz_av]

## 2017-05-04 LAB — CBC
HCT: 32.9 % — ABNORMAL LOW (ref 36.0–46.0)
Hemoglobin: 10.6 g/dL — ABNORMAL LOW (ref 12.0–15.0)
MCH: 29.4 pg (ref 26.0–34.0)
MCHC: 32.2 g/dL (ref 30.0–36.0)
MCV: 91.4 fL (ref 78.0–100.0)
PLATELETS: 231 10*3/uL (ref 150–400)
RBC: 3.6 MIL/uL — AB (ref 3.87–5.11)
RDW: 16.1 % — ABNORMAL HIGH (ref 11.5–15.5)
WBC: 9.2 10*3/uL (ref 4.0–10.5)

## 2017-05-04 LAB — COMPREHENSIVE METABOLIC PANEL
ALT: 15 U/L (ref 14–54)
AST: 18 U/L (ref 15–41)
Albumin: 3.2 g/dL — ABNORMAL LOW (ref 3.5–5.0)
Alkaline Phosphatase: 65 U/L (ref 38–126)
Anion gap: 13 (ref 5–15)
BILIRUBIN TOTAL: 0.6 mg/dL (ref 0.3–1.2)
BUN: 73 mg/dL — AB (ref 6–20)
CO2: 21 mmol/L — ABNORMAL LOW (ref 22–32)
CREATININE: 3.08 mg/dL — AB (ref 0.44–1.00)
Calcium: 9 mg/dL (ref 8.9–10.3)
Chloride: 104 mmol/L (ref 101–111)
GFR calc non Af Amer: 14 mL/min — ABNORMAL LOW (ref >60)
GFR, EST AFRICAN AMERICAN: 16 mL/min — AB (ref >60)
Glucose, Bld: 210 mg/dL — ABNORMAL HIGH (ref 65–99)
POTASSIUM: 3.3 mmol/L — AB (ref 3.5–5.1)
Sodium: 138 mmol/L (ref 135–145)
TOTAL PROTEIN: 5.7 g/dL — AB (ref 6.5–8.1)

## 2017-05-04 LAB — TROPONIN I
TROPONIN I: 0.08 ng/mL — AB (ref <0.03)
Troponin I: 0.08 ng/mL (ref <0.03)
Troponin I: 0.07 ng/mL (ref <0.03)

## 2017-05-04 LAB — CREATININE, SERUM
Creatinine, Ser: 3.07 mg/dL — ABNORMAL HIGH (ref 0.44–1.00)
GFR calc Af Amer: 17 mL/min — ABNORMAL LOW (ref >60)
GFR, EST NON AFRICAN AMERICAN: 14 mL/min — AB (ref >60)

## 2017-05-04 LAB — LIPID PANEL
CHOL/HDL RATIO: 3.8 ratio
CHOLESTEROL: 130 mg/dL (ref 0–200)
HDL: 34 mg/dL — ABNORMAL LOW (ref >40)
LDL CALC: 74 mg/dL (ref 0–99)
Triglycerides: 111 mg/dL (ref <150)
VLDL: 22 mg/dL (ref 0–40)

## 2017-05-04 LAB — GLUCOSE, CAPILLARY
GLUCOSE-CAPILLARY: 114 mg/dL — AB (ref 65–99)
GLUCOSE-CAPILLARY: 128 mg/dL — AB (ref 65–99)
GLUCOSE-CAPILLARY: 145 mg/dL — AB (ref 65–99)
GLUCOSE-CAPILLARY: 196 mg/dL — AB (ref 65–99)
GLUCOSE-CAPILLARY: 243 mg/dL — AB (ref 65–99)
GLUCOSE-CAPILLARY: 43 mg/dL — AB (ref 65–99)
GLUCOSE-CAPILLARY: 51 mg/dL — AB (ref 65–99)
Glucose-Capillary: 53 mg/dL — ABNORMAL LOW (ref 65–99)

## 2017-05-04 LAB — INFLUENZA PANEL BY PCR (TYPE A & B)
INFLAPCR: NEGATIVE
INFLBPCR: NEGATIVE

## 2017-05-04 LAB — MRSA PCR SCREENING: MRSA BY PCR: NEGATIVE

## 2017-05-04 MED ORDER — ROSUVASTATIN CALCIUM 20 MG PO TABS
20.0000 mg | ORAL_TABLET | Freq: Every day | ORAL | Status: DC
  Administered 2017-05-04: 20 mg via ORAL
  Filled 2017-05-04: qty 1

## 2017-05-04 MED ORDER — SODIUM CHLORIDE 0.9% FLUSH
3.0000 mL | Freq: Two times a day (BID) | INTRAVENOUS | Status: DC
  Administered 2017-05-04: 3 mL via INTRAVENOUS

## 2017-05-04 MED ORDER — NITROGLYCERIN IN D5W 200-5 MCG/ML-% IV SOLN: 0.0000 ug/min | INTRAVENOUS | Status: AC

## 2017-05-04 MED ORDER — FUROSEMIDE 10 MG/ML IJ SOLN
40.0000 mg | Freq: Two times a day (BID) | INTRAMUSCULAR | Status: DC
  Administered 2017-05-04 – 2017-05-06 (×5): 40 mg via INTRAVENOUS
  Filled 2017-05-04 (×5): qty 4

## 2017-05-04 MED ORDER — POLYVINYL ALCOHOL 1.4 % OP SOLN: 1.0000 [drp] | Freq: Three times a day (TID) | OPHTHALMIC | Status: DC | PRN

## 2017-05-04 MED ORDER — INSULIN ASPART 100 UNIT/ML ~~LOC~~ SOLN: 0.0000 [IU] | Freq: Every day | SUBCUTANEOUS | Status: DC

## 2017-05-04 MED ORDER — DEXTROSE 50 % IV SOLN
INTRAVENOUS | Status: AC
  Administered 2017-05-04: 23:00:00
  Filled 2017-05-04: qty 50

## 2017-05-04 MED ORDER — ACETAMINOPHEN 325 MG PO TABS: 650.0000 mg | ORAL_TABLET | Freq: Four times a day (QID) | ORAL | Status: DC | PRN

## 2017-05-04 MED ORDER — CARVEDILOL 12.5 MG PO TABS
12.5000 mg | ORAL_TABLET | Freq: Two times a day (BID) | ORAL | Status: DC
  Administered 2017-05-04 – 2017-05-05 (×4): 12.5 mg via ORAL
  Filled 2017-05-04 (×4): qty 1

## 2017-05-04 MED ORDER — INSULIN ASPART PROT & ASPART (70-30 MIX) 100 UNIT/ML ~~LOC~~ SUSP
30.0000 [IU] | Freq: Two times a day (BID) | SUBCUTANEOUS | Status: DC
  Administered 2017-05-04 – 2017-05-05 (×3): 30 [IU] via SUBCUTANEOUS
  Filled 2017-05-04: qty 10

## 2017-05-04 MED ORDER — INSULIN ASPART 100 UNIT/ML ~~LOC~~ SOLN
0.0000 [IU] | Freq: Three times a day (TID) | SUBCUTANEOUS | Status: DC
  Administered 2017-05-05: 2 [IU] via SUBCUTANEOUS
  Administered 2017-05-05: 3 [IU] via SUBCUTANEOUS
  Administered 2017-05-05: 2 [IU] via SUBCUTANEOUS
  Administered 2017-05-06: 5 [IU] via SUBCUTANEOUS
  Administered 2017-05-06 – 2017-05-07 (×2): 1 [IU] via SUBCUTANEOUS

## 2017-05-04 MED ORDER — AYR SALINE NASAL NA GEL: 1.0000 "application " | Freq: Four times a day (QID) | NASAL | Status: DC | PRN

## 2017-05-04 MED ORDER — POTASSIUM CHLORIDE CRYS ER 20 MEQ PO TBCR
40.0000 meq | EXTENDED_RELEASE_TABLET | Freq: Once | ORAL | Status: AC
  Administered 2017-05-04: 40 meq via ORAL
  Filled 2017-05-04: qty 2

## 2017-05-04 MED ORDER — PROSIGHT PO TABS
1.0000 | ORAL_TABLET | Freq: Every day | ORAL | Status: DC
  Administered 2017-05-05 – 2017-05-07 (×3): 1 via ORAL
  Filled 2017-05-04 (×5): qty 1

## 2017-05-04 MED ORDER — ACETAMINOPHEN 650 MG RE SUPP: 650.0000 mg | Freq: Four times a day (QID) | RECTAL | Status: DC | PRN

## 2017-05-04 MED ORDER — HYDRALAZINE HCL 50 MG PO TABS
50.0000 mg | ORAL_TABLET | Freq: Three times a day (TID) | ORAL | Status: DC
  Administered 2017-05-04 – 2017-05-06 (×9): 50 mg via ORAL
  Filled 2017-05-04 (×9): qty 1

## 2017-05-04 MED ORDER — SALINE SPRAY 0.65 % NA SOLN
1.0000 | Freq: Four times a day (QID) | NASAL | Status: DC | PRN
  Filled 2017-05-04: qty 44

## 2017-05-04 MED ORDER — ANASTROZOLE 1 MG PO TABS
1.0000 mg | ORAL_TABLET | Freq: Every day | ORAL | Status: DC
  Administered 2017-05-04 – 2017-05-07 (×4): 1 mg via ORAL
  Filled 2017-05-04 (×5): qty 1

## 2017-05-04 MED ORDER — HYDRALAZINE HCL 20 MG/ML IJ SOLN
10.0000 mg | Freq: Four times a day (QID) | INTRAMUSCULAR | Status: DC | PRN
  Administered 2017-05-05: 10 mg via INTRAVENOUS
  Filled 2017-05-04: qty 1

## 2017-05-04 MED ORDER — SODIUM CHLORIDE 0.9 % IV SOLN: 250.0000 mL | INTRAVENOUS | Status: DC | PRN

## 2017-05-04 MED ORDER — DOCUSATE SODIUM 100 MG PO CAPS
100.0000 mg | ORAL_CAPSULE | Freq: Every day | ORAL | Status: DC
  Administered 2017-05-04 – 2017-05-06 (×4): 100 mg via ORAL
  Filled 2017-05-04 (×4): qty 1

## 2017-05-04 MED ORDER — ISOSORBIDE DINITRATE 10 MG PO TABS
10.0000 mg | ORAL_TABLET | Freq: Three times a day (TID) | ORAL | Status: DC
  Administered 2017-05-04 – 2017-05-07 (×9): 10 mg via ORAL
  Filled 2017-05-04 (×9): qty 1

## 2017-05-04 MED ORDER — VITAMIN D 1000 UNITS PO TABS
2000.0000 [IU] | ORAL_TABLET | Freq: Every day | ORAL | Status: DC
  Administered 2017-05-04 – 2017-05-07 (×4): 2000 [IU] via ORAL
  Filled 2017-05-04 (×4): qty 2

## 2017-05-04 MED ORDER — HEPARIN SODIUM (PORCINE) 5000 UNIT/ML IJ SOLN
5000.0000 [IU] | Freq: Three times a day (TID) | INTRAMUSCULAR | Status: DC
  Administered 2017-05-04 – 2017-05-07 (×10): 5000 [IU] via SUBCUTANEOUS
  Filled 2017-05-04 (×10): qty 1

## 2017-05-04 MED ORDER — ALLOPURINOL 100 MG PO TABS
100.0000 mg | ORAL_TABLET | Freq: Two times a day (BID) | ORAL | Status: DC
  Administered 2017-05-04 – 2017-05-07 (×8): 100 mg via ORAL
  Filled 2017-05-04 (×8): qty 1

## 2017-05-04 MED ORDER — FLUTICASONE PROPIONATE 50 MCG/ACT NA SUSP: 2.0000 | Freq: Every day | NASAL | Status: DC | PRN

## 2017-05-04 MED ORDER — SODIUM CHLORIDE 0.9% FLUSH: 3.0000 mL | INTRAVENOUS | Status: DC | PRN

## 2017-05-04 MED ORDER — DEXTROSE 50 % IV SOLN
25.0000 g | Freq: Once | INTRAVENOUS | Status: AC
  Administered 2017-05-04: 25 g via INTRAVENOUS

## 2017-05-04 MED ORDER — ASPIRIN 325 MG PO TABS
325.0000 mg | ORAL_TABLET | Freq: Every day | ORAL | Status: DC
  Administered 2017-05-04 – 2017-05-06 (×4): 325 mg via ORAL
  Filled 2017-05-04 (×4): qty 1

## 2017-05-04 MED ORDER — SODIUM CHLORIDE 0.9% FLUSH: 10.0000 mL | INTRAVENOUS | Status: DC | PRN

## 2017-05-04 NOTE — Progress Notes (Signed)
  Echocardiogram 2D Echocardiogram has been performed.  [name] 05/04/2017, 2:39 PM

## 2017-05-04 NOTE — Progress Notes (Signed)
Placed pt. On 3L [place]. Pt. Tolerating well at this time with no distress.

## 2017-05-04 NOTE — Progress Notes (Signed)
[facility] TEAM 1 - Stepdown/ICU TEAM  [name]  HYQ:[id number] DOB: Aug 13, 1945 DOA: 05/03/2017 PCP: Unk Pinto, MD    Brief Narrative:  72 y.o. female w/ a hx of HTN, HLD, DM2, CKD stage 4, L breast cancer s/p lumpectomy / XRT, diastolic CHF, NSTEMI in 9528 s/p CABG ([place]->LAD, SVG->diag, SVG ->OM1/OM2),  and PVD who presented w/ c/o 3 days of progressive dyspnea and orthopnea.    In ED a CXR noted interstitial opacities bilaterally favored to represent edema.  Significant Events: 1/27 admit   Subjective: The patient is resting comfortably in her hospital bed.  She reports that she feels significantly better than when she first presented yesterday.  She denies current chest pain.  She is less short of breath.  She denies nausea vomiting or abdominal pain.  Assessment & Plan:  Acute exacerbation of grade 2 diastolic CHF TTE 07/07/30 noted grade 2 DD w/ EF 55-60% - f/u pending to assure no signif change in wall motion or systolic fxn which would require further evaluation - need to establish target weight and diuretic dose - counseled on medication and salt compliance   Mild Troponin elevation - CAD s/p CABG No sx to suggest USAP at this time - f/u TTE to determine if further eval warranted   CKD stage 4 crt 3.13 04/28/17 and 2.99 03/06/17 - followed by Dr. Hollie Salk - follow crt w/ diuresis   Hypertension Poorly controlled in setting of volume overload - watch w/ diuresis   HLD Cont home medical tx  Anemia Likely due to CKD - no evident blood loss - follow   DM2 Follow w/o change for now   DVT prophylaxis: SQ heparin  Code Status: FULL CODE Family Communication: Spoke with patient and husband at bedside at length Disposition Plan: tele   Consultants:  None   Antimicrobials:  none  Objective: Blood pressure (!) 158/71, pulse 86, temperature 98.9 F (37.2 C), temperature source Oral, resp. rate (!) 22, height 5\' 5"  (1.651 m), weight 81.8 kg (180 lb 6.4 oz),  SpO2 93 %.  Intake/Output Summary (Last 24 hours) at 05/04/2017 1634 Last data filed at 05/04/2017 1300 Gross per 24 hour  Intake 542.98 ml  Output 1800 ml  Net -1257.02 ml   Filed Weights   05/04/17 0651  Weight: 81.8 kg (180 lb 6.4 oz)    Examination: General: No acute respiratory distress Lungs: Clear to auscultation bilaterally without wheezes or crackles Cardiovascular: Regular rate and rhythm without murmur gallop or rub normal S1 and S2 Abdomen: Nontender, nondistended, soft, bowel sounds positive, no rebound, no ascites, no appreciable mass Extremities: No significant cyanosis, clubbing, or edema bilateral lower extremities  CBC: Recent Labs  Lab 04/28/17 1154 05/03/17 1951 05/04/17 0637  WBC 9.9 12.0* 9.2  NEUTROABS 6,900 8.1*  --   HGB 11.8 11.9* 10.6*  HCT 36.0 37.1 32.9*  MCV 87.8 91.6 91.4  PLT 226 243 440   Basic Metabolic Panel: Recent Labs  Lab 04/28/17 1154 05/03/17 1951 05/04/17 0119 05/04/17 0637  NA 143 139  --  138  K 4.6 3.8  --  3.3*  CL 108 106  --  104  CO2 27 19*  --  21*  GLUCOSE 70 146*  --  210*  BUN 60* 73*  --  73*  CREATININE 3.13* 2.91* 3.07* 3.08*  CALCIUM 9.8 9.3  --  9.0  MG 2.4  --   --   --    GFR: Estimated Creatinine Clearance: 17.7  mL/min (A) (by C-G formula based on SCr of 3.08 mg/dL (H)).  Liver Function Tests: Recent Labs  Lab 04/28/17 1154 05/04/17 0637  AST 19 18  ALT 13 15  ALKPHOS  --  65  BILITOT 0.4 0.6  PROT 6.1 5.7*  ALBUMIN  --  3.2*    Cardiac Enzymes: Recent Labs  Lab 05/04/17 0119 05/04/17 0637 05/04/17 1105  TROPONINI 0.08* 0.08* 0.07*    HbA1C: Hgb A1c MFr Bld  Date/Time Value Ref Range Status  04/28/2017 11:54 AM 7.0 (H) <5.7 % of total Hgb Final    Comment:    For someone without known diabetes, a hemoglobin A1c value of 6.5% or greater indicates that they may have  diabetes and this should be confirmed with a follow-up  test. . For someone with known diabetes, a value <7%  indicates  that their diabetes is well controlled and a value  greater than or equal to 7% indicates suboptimal  control. A1c targets should be individualized based on  duration of diabetes, age, comorbid conditions, and  other considerations. . Currently, no consensus exists regarding use of hemoglobin A1c for diagnosis of diabetes for children. Marland Kitchen   10/13/2016 11:38 AM 10.2 (H) <5.7 % Final    Comment:      For someone without known diabetes, a hemoglobin A1c value of 6.5% or greater indicates that they may have diabetes and this should be confirmed with a follow-up test.   For someone with known diabetes, a value <7% indicates that their diabetes is well controlled and a value greater than or equal to 7% indicates suboptimal control. A1c targets should be individualized based on duration of diabetes, age, comorbid conditions, and other considerations.   Currently, no consensus exists for use of hemoglobin A1c for diagnosis of diabetes for children.       CBG: Recent Labs  Lab 05/04/17 0036 05/04/17 0702 05/04/17 1129 05/04/17 1544  GLUCAP 145* 196* 243* 114*    Recent Results (from the past 240 hour(s))  MRSA PCR Screening     Status: None   Collection Time: 05/04/17  1:12 AM  Result Value Ref Range Status   MRSA by PCR NEGATIVE NEGATIVE Final    Comment:        The GeneXpert MRSA Assay (FDA approved for NASAL specimens only), is one component of a comprehensive MRSA colonization surveillance program. It is not intended to diagnose MRSA infection nor to guide or monitor treatment for MRSA infections.      Scheduled Meds: . allopurinol  100 mg Oral BID  . anastrozole  1 mg Oral Daily  . aspirin  325 mg Oral QHS  . carvedilol  12.5 mg Oral BID WC  . cholecalciferol  2,000 Units Oral Daily  . docusate sodium  100 mg Oral QHS  . furosemide  40 mg Intravenous BID  . heparin  5,000 Units Subcutaneous Q8H  . hydrALAZINE  50 mg Oral Q8H  . insulin aspart   0-9 Units Subcutaneous Q4H  . insulin aspart protamine- aspart  30 Units Subcutaneous BID WC  . rosuvastatin  20 mg Oral Daily  . sodium chloride flush  3 mL Intravenous Q12H   Continuous Infusions: . sodium chloride    . nitroGLYCERIN 15 mcg/min (05/04/17 0752)     LOS: 1 day   Cherene Altes, MD [facility] Office  [phone] Pager - Text Page per Amion as per below:  On-Call/Text Page:      [email or web address]  password [email or web address]  If 7PM-7AM, please contact night-coverage [email or web address] Password [facility] 05/04/2017, 4:34 PM

## 2017-05-04 NOTE — Progress Notes (Addendum)
CRITICAL VALUE ALERT  Critical Value: 0.08  Date & Time Notied: 05/04/17 0304  Provider Notified: Jeannette Corpus, NP Orders Received/Actions taken: None

## 2017-05-05 LAB — GLUCOSE, CAPILLARY
GLUCOSE-CAPILLARY: 255 mg/dL — AB (ref 65–99)
GLUCOSE-CAPILLARY: 240 mg/dL — AB (ref 65–99)
GLUCOSE-CAPILLARY: 127 mg/dL — AB (ref 65–99)
Glucose-Capillary: 170 mg/dL — ABNORMAL HIGH (ref 65–99)
Glucose-Capillary: 158 mg/dL — ABNORMAL HIGH (ref 65–99)

## 2017-05-05 LAB — COMPREHENSIVE METABOLIC PANEL
ALBUMIN: 3.2 g/dL — AB (ref 3.5–5.0)
ALT: 16 U/L (ref 14–54)
AST: 21 U/L (ref 15–41)
Alkaline Phosphatase: 67 U/L (ref 38–126)
Anion gap: 14 (ref 5–15)
BILIRUBIN TOTAL: 0.4 mg/dL (ref 0.3–1.2)
BUN: 76 mg/dL — ABNORMAL HIGH (ref 6–20)
CO2: 22 mmol/L (ref 22–32)
Calcium: 9.2 mg/dL (ref 8.9–10.3)
Chloride: 103 mmol/L (ref 101–111)
Creatinine, Ser: 3.11 mg/dL — ABNORMAL HIGH (ref 0.44–1.00)
GFR calc Af Amer: 16 mL/min — ABNORMAL LOW (ref >60)
GFR calc non Af Amer: 14 mL/min — ABNORMAL LOW (ref >60)
GLUCOSE: 281 mg/dL — AB (ref 65–99)
Potassium: 3.6 mmol/L (ref 3.5–5.1)
Sodium: 139 mmol/L (ref 135–145)
TOTAL PROTEIN: 5.7 g/dL — AB (ref 6.5–8.1)

## 2017-05-05 LAB — CBC
HEMATOCRIT: 33.5 % — AB (ref 36.0–46.0)
HEMOGLOBIN: 11 g/dL — AB (ref 12.0–15.0)
MCH: 29.9 pg (ref 26.0–34.0)
MCHC: 32.8 g/dL (ref 30.0–36.0)
MCV: 91 fL (ref 78.0–100.0)
Platelets: 219 10*3/uL (ref 150–400)
RBC: 3.68 MIL/uL — ABNORMAL LOW (ref 3.87–5.11)
RDW: 16 % — ABNORMAL HIGH (ref 11.5–15.5)
WBC: 9.7 10*3/uL (ref 4.0–10.5)

## 2017-05-05 MED ORDER — INSULIN ASPART PROT & ASPART (70-30 MIX) 100 UNIT/ML ~~LOC~~ SUSP
20.0000 [IU] | Freq: Two times a day (BID) | SUBCUTANEOUS | Status: DC
Start: 2017-05-06 — End: 2017-05-07
  Administered 2017-05-05 – 2017-05-07 (×4): 20 [IU] via SUBCUTANEOUS
  Filled 2017-05-05: qty 10

## 2017-05-05 MED ORDER — ROSUVASTATIN CALCIUM 20 MG PO TABS
20.0000 mg | ORAL_TABLET | Freq: Every day | ORAL | Status: DC
  Administered 2017-05-05: 20 mg via ORAL
  Filled 2017-05-05: qty 1

## 2017-05-05 MED ORDER — CARVEDILOL 25 MG PO TABS
25.0000 mg | ORAL_TABLET | Freq: Two times a day (BID) | ORAL | Status: DC
  Administered 2017-05-06 – 2017-05-07 (×3): 25 mg via ORAL
  Filled 2017-05-05 (×3): qty 1

## 2017-05-05 MED ORDER — INSULIN ASPART PROT & ASPART (70-30 MIX) 100 UNIT/ML ~~LOC~~ SUSP
24.0000 [IU] | Freq: Two times a day (BID) | SUBCUTANEOUS | Status: DC
  Filled 2017-05-05: qty 10

## 2017-05-05 MED ORDER — POLYETHYLENE GLYCOL 3350 17 G PO PACK
17.0000 g | PACK | Freq: Every day | ORAL | Status: DC
  Administered 2017-05-05: 17 g via ORAL
  Filled 2017-05-05 (×3): qty 1

## 2017-05-05 NOTE — Progress Notes (Signed)
[facility] TEAM 1 - Stepdown/ICU TEAM  SADY MONACO  YKD:[id number] DOB: Apr 28, 1945 DOA: 05/03/2017 PCP: Unk Pinto, MD    Brief Narrative:  72 y.o. female w/ a hx of HTN, HLD, DM2, CKD stage 4, L breast cancer s/p lumpectomy / XRT, diastolic CHF, NSTEMI in 3976 s/p CABG ([place]->LAD, SVG->diag, SVG ->OM1/OM2),  and PVD who presented w/ c/o 3 days of progressive dyspnea and orthopnea.    In ED a CXR noted interstitial opacities bilaterally favored to represent edema.  Significant Events: 1/27 admit   Subjective: The patient feels much better today and feels that she is close to her baseline.  She denies chest pain or shortness of breath at this time.  She denies nausea vomiting or abdominal pain.  She did not sleep well last night as she suffered with an episode of hypoglycemia which required significant attention to correct.  Assessment & Plan:  Acute exacerbation of grade 2 diastolic CHF TTE 10/08/39 noted grade 2 DD w/ EF 55-60% - f/u TTE this admit reports lower EF at 45-50% w/ diffuse hypokinesis and grade 2 DD - I am not sure if this change in EF is simply due to interpretation or reflects an actual drop in her ejection fraction which might warrant further inpatient investigation -we will ask her Cardiologist to see her in consultation tomorrow morning to comment - need to establish target weight and diuretic dose prior to d/c, with present goal appearing to be ~180# - counseled on medication and salt compliance, as well as checking daily weight and utilizing extra prn lasix dose (we will establish at time of d/c) to remain on goal if wgt climbs for consecutive days   Filed Weights   05/04/17 0651 05/05/17 0300  Weight: 81.8 kg (180 lb 6.4 oz) 81.8 kg (180 lb 6.4 oz)    Mild Troponin elevation - CAD s/p CABG No sx to suggest USAP at this time - see discussion regarding change in EF on TTE above    Mod mitral regurg Noted on TTE this admit - Cards to see in AM   CKD stage  4 crt 3.13 04/28/17 and 2.99 03/06/17 - followed by Dr. Hollie Salk - stable within her recent baseline range at this time - if cardiac cath is indicated she may benefit from preemptive tx   Hypertension BP remains elevated - adjust tx further and follow   HLD Cont home medical tx  Anemia Likely due to CKD - no evident blood loss - follow   DM2 Lower dose of 70/30 as inpatient - suspect hypoglycemia due to less intake while in hospital   DVT prophylaxis: SQ heparin  Code Status: FULL CODE Family Communication: Spoke with patient and husband at bedside at length Disposition Plan: tele   Consultants:  None   Antimicrobials:  none  Objective: Blood pressure (!) 160/68, pulse 89, temperature 98.3 F (36.8 C), temperature source Oral, resp. rate (!) 22, height 5\' 5"  (1.651 m), weight 81.8 kg (180 lb 6.4 oz), SpO2 94 %.  Intake/Output Summary (Last 24 hours) at 05/05/2017 1731 Last data filed at 05/05/2017 1430 Gross per 24 hour  Intake 1680 ml  Output 2700 ml  Net -1020 ml   Filed Weights   05/04/17 0651 05/05/17 0300  Weight: 81.8 kg (180 lb 6.4 oz) 81.8 kg (180 lb 6.4 oz)    Examination: General: No acute respiratory distress at rest  Lungs: Clear to auscultation B without wheezes or crackles Cardiovascular: RRR without murmur gallop or  rub normal S1 and S2 Abdomen: Nontender, nondistended, soft, bowel sounds positive, no rebound, no ascites Extremities: Trace edema bilateral lower extremities  CBC: Recent Labs  Lab 05/03/17 1951 05/04/17 0637 05/05/17 0455  WBC 12.0* 9.2 9.7  NEUTROABS 8.1*  --   --   HGB 11.9* 10.6* 11.0*  HCT 37.1 32.9* 33.5*  MCV 91.6 91.4 91.0  PLT 243 231 081   Basic Metabolic Panel: Recent Labs  Lab 05/03/17 1951 05/04/17 0119 05/04/17 0637 05/05/17 0455  NA 139  --  138 139  K 3.8  --  3.3* 3.6  CL 106  --  104 103  CO2 19*  --  21* 22  GLUCOSE 146*  --  210* 281*  BUN 73*  --  73* 76*  CREATININE 2.91* 3.07* 3.08* 3.11*   CALCIUM 9.3  --  9.0 9.2   GFR: Estimated Creatinine Clearance: 17.5 mL/min (A) (by C-G formula based on SCr of 3.11 mg/dL (H)).  Liver Function Tests: Recent Labs  Lab 05/04/17 0637 05/05/17 0455  AST 18 21  ALT 15 16  ALKPHOS 65 67  BILITOT 0.6 0.4  PROT 5.7* 5.7*  ALBUMIN 3.2* 3.2*    Cardiac Enzymes: Recent Labs  Lab 05/04/17 0119 05/04/17 0637 05/04/17 1105  TROPONINI 0.08* 0.08* 0.07*    HbA1C: Hgb A1c MFr Bld  Date/Time Value Ref Range Status  04/28/2017 11:54 AM 7.0 (H) <5.7 % of total Hgb Final    Comment:    For someone without known diabetes, a hemoglobin A1c value of 6.5% or greater indicates that they may have  diabetes and this should be confirmed with a follow-up  test. . For someone with known diabetes, a value <7% indicates  that their diabetes is well controlled and a value  greater than or equal to 7% indicates suboptimal  control. A1c targets should be individualized based on  duration of diabetes, age, comorbid conditions, and  other considerations. . Currently, no consensus exists regarding use of hemoglobin A1c for diagnosis of diabetes for children. Marland Kitchen   10/13/2016 11:38 AM 10.2 (H) <5.7 % Final    Comment:      For someone without known diabetes, a hemoglobin A1c value of 6.5% or greater indicates that they may have diabetes and this should be confirmed with a follow-up test.   For someone with known diabetes, a value <7% indicates that their diabetes is well controlled and a value greater than or equal to 7% indicates suboptimal control. A1c targets should be individualized based on duration of diabetes, age, comorbid conditions, and other considerations.   Currently, no consensus exists for use of hemoglobin A1c for diagnosis of diabetes for children.       CBG: Recent Labs  Lab 05/04/17 2300 05/05/17 0308 05/05/17 0733 05/05/17 1202 05/05/17 1657  GLUCAP 128* 255* 240* 170* 158*    Recent Results (from the past  240 hour(s))  MRSA PCR Screening     Status: None   Collection Time: 05/04/17  1:12 AM  Result Value Ref Range Status   MRSA by PCR NEGATIVE NEGATIVE Final    Comment:        The GeneXpert MRSA Assay (FDA approved for NASAL specimens only), is one component of a comprehensive MRSA colonization surveillance program. It is not intended to diagnose MRSA infection nor to guide or monitor treatment for MRSA infections.      Scheduled Meds: . allopurinol  100 mg Oral BID  . anastrozole  1 mg  Oral Daily  . aspirin  325 mg Oral QHS  . carvedilol  12.5 mg Oral BID WC  . cholecalciferol  2,000 Units Oral Daily  . docusate sodium  100 mg Oral QHS  . furosemide  40 mg Intravenous BID  . heparin  5,000 Units Subcutaneous Q8H  . hydrALAZINE  50 mg Oral Q8H  . insulin aspart  0-5 Units Subcutaneous QHS  . insulin aspart  0-9 Units Subcutaneous TID WC  . [START ON 05/06/2017] insulin aspart protamine- aspart  24 Units Subcutaneous BID WC  . isosorbide dinitrate  10 mg Oral TID  . multivitamin  1 tablet Oral Daily  . rosuvastatin  20 mg Oral Daily      LOS: 2 days   Cherene Altes, MD [facility] Office  [phone] Pager - Text Page per Amion as per below:  On-Call/Text Page:      [email or web address]      password [email or web address]  If 7PM-7AM, please contact night-coverage [email or web address] Password [facility] 05/05/2017, 5:31 PM

## 2017-05-05 NOTE — Progress Notes (Signed)
BS now 128 and pt states that she feels much better, skin now warm and dry, will continue to monitor.

## 2017-05-05 NOTE — Progress Notes (Signed)
Pt's BS now 51 and she is now more diaphoretic, will call MD to get order for D50 and advise and continue to monitor.

## 2017-05-05 NOTE — Progress Notes (Signed)
BS 51 at this time but she states that she feels better, given Glucerna chocolate shake and Macaroni and cheese, will give D50 1/2 amp, awaiting call back from MD, no other changes noted.

## 2017-05-05 NOTE — Progress Notes (Signed)
Pt started C/O feeling funny, upon assessment, pt found to be diaphoretic, BS checked and was 53, pt given graham crackers, peanut butter and apple juice, will reassess BS and continue to monitor.

## 2017-05-05 NOTE — Plan of Care (Signed)
Pt ambulates to BR with minimal assistance and uses call light appropriately. VSS and having no complaints at this time.

## 2017-05-05 NOTE — Progress Notes (Signed)
Inpatient Diabetes Program Recommendations  AACE/ADA: New Consensus Statement on Inpatient Glycemic Control (2015)  Target Ranges:  Prepandial:   less than 140 mg/dL      Peak postprandial:   less than 180 mg/dL (1-2 hours)      Critically ill patients:  140 - 180 mg/dL   Lab Results  Component Value Date   GLUCAP 240 (H) 05/05/2017   HGBA1C 7.0 (H) 04/28/2017    Review of Glycemic Control Results for [name], [name] (MRN [id number]) as of 05/05/2017 12:00  Ref. Range 05/04/2017 21:54 05/04/2017 22:02 05/04/2017 23:00 05/05/2017 03:08 05/05/2017 07:33  Glucose-Capillary Latest Ref Range: 65 - 99 mg/dL 43 (LL) 51 (L) 128 (H) 255 (H) 240 (H)   Diabetes history: Type 2 DM Outpatient Diabetes medications: Novolin 70/30: 60-65 Units in AM, 30-35 Units in PM Current orders for Inpatient glycemic control: Novolog 0-9 Units TID, Novolog 0-5 Units QHS, Novolog 70/30 30 Units BID  Inpatient Diabetes Program Recommendations:    Hypoglycemia protocol initiated, patient received D5 X2. Please consider reducing Novolog 70/30 to 25 Units BIDAC.   Thanks, Bronson Curb, MSN, RNC-OB Diabetes Coordinator [phone] (8a-5p)

## 2017-05-06 ENCOUNTER — Encounter (HOSPITAL_COMMUNITY): Payer: Self-pay | Admitting: Physician Assistant

## 2017-05-06 DIAGNOSIS — I340 Nonrheumatic mitral (valve) insufficiency: Secondary | ICD-10-CM

## 2017-05-06 DIAGNOSIS — J810 Acute pulmonary edema: Secondary | ICD-10-CM

## 2017-05-06 DIAGNOSIS — I161 Hypertensive emergency: Secondary | ICD-10-CM

## 2017-05-06 DIAGNOSIS — I5041 Acute combined systolic (congestive) and diastolic (congestive) heart failure: Secondary | ICD-10-CM

## 2017-05-06 DIAGNOSIS — I10 Essential (primary) hypertension: Secondary | ICD-10-CM

## 2017-05-06 LAB — GLUCOSE, CAPILLARY
GLUCOSE-CAPILLARY: 79 mg/dL (ref 65–99)
GLUCOSE-CAPILLARY: 279 mg/dL — AB (ref 65–99)
GLUCOSE-CAPILLARY: 142 mg/dL — AB (ref 65–99)
GLUCOSE-CAPILLARY: 107 mg/dL — AB (ref 65–99)
Glucose-Capillary: 125 mg/dL — ABNORMAL HIGH (ref 65–99)

## 2017-05-06 LAB — BASIC METABOLIC PANEL
Anion gap: 12 (ref 5–15)
BUN: 79 mg/dL — AB (ref 6–20)
CHLORIDE: 104 mmol/L (ref 101–111)
CO2: 24 mmol/L (ref 22–32)
Calcium: 8.8 mg/dL — ABNORMAL LOW (ref 8.9–10.3)
Creatinine, Ser: 3.49 mg/dL — ABNORMAL HIGH (ref 0.44–1.00)
GFR calc Af Amer: 14 mL/min — ABNORMAL LOW (ref >60)
GFR, EST NON AFRICAN AMERICAN: 12 mL/min — AB (ref >60)
GLUCOSE: 75 mg/dL (ref 65–99)
POTASSIUM: 3.3 mmol/L — AB (ref 3.5–5.1)
Sodium: 140 mmol/L (ref 135–145)

## 2017-05-06 MED ORDER — HYDRALAZINE HCL 50 MG PO TABS
75.0000 mg | ORAL_TABLET | Freq: Three times a day (TID) | ORAL | Status: DC
  Administered 2017-05-06 – 2017-05-07 (×2): 75 mg via ORAL
  Filled 2017-05-06 (×2): qty 1

## 2017-05-06 MED ORDER — POTASSIUM CHLORIDE 20 MEQ PO PACK
40.0000 meq | PACK | Freq: Once | ORAL | Status: AC
  Administered 2017-05-06: 40 meq via ORAL
  Filled 2017-05-06 (×2): qty 2

## 2017-05-06 MED ORDER — ATORVASTATIN CALCIUM 80 MG PO TABS
80.0000 mg | ORAL_TABLET | Freq: Every day | ORAL | Status: DC
  Administered 2017-05-06: 80 mg via ORAL
  Filled 2017-05-06: qty 1

## 2017-05-06 NOTE — Plan of Care (Signed)
Pt updated on POC with verbalization of understanding.

## 2017-05-06 NOTE — Progress Notes (Addendum)
PROGRESS NOTE    [name]  IWP:[id number] DOB: [birth year] DOA: 05/03/2017 PCP: Unk Pinto, MD    Brief Narrative:  72 year old female who presented with dyspnea. She does have a significant past medical history of hypertension, dyslipidemia, coronary artery disease status post bypass grafting 2011, peripheral vascular disease, and diastolic heart failure. For last 3 days prior to hospitalization, she developed worsening dyspnea, orthopnea and lower sternum and the edema along with weight gain 5 to 7 lbs. On initial physical examination blood pressure 182/85, heart rate 81, temperature 97.8, respiratory 28, oxygen saturations 100%. No JVD, her lungs had rales bilaterally more right than left, no wheezing, heart S1-S2 present rhythmic, no gallops, rubs or murmurs,abdomen was soft nontender, positive lower extremity edema. Sodium 139, potassium 3.8, glucose 6, bicarbonate 19, glucose 146, BUN 73, creatinine 2.91, troponin 0.08, BNP 784, white count 12.0, hemoglobin 10.9, hematocrit 37.1, platelets 243. Chest x-ray with cardiomegaly, increased insteritial markings bilaterally, consistent with pulmonary edema, EKG was sinus rhythm, left axis deviation, left bundle branch block, septal Q waves (chronic changes).   Patient was admitted to the hospital working diagnosis acute on chronic diastolic heart failure decompensation, complicated by cardiogenic pulmonary edema.  Assessment & Plan:   Principal Problem:   CHF (congestive heart failure) (HCC) Active Problems:   CKD stage 3 due to type 2 diabetes mellitus (HCC)   Diabetes mellitus type 2, insulin dependent (HCC)   Anemia   1. Acute on chronic diastolic heart failure exacerbation. Patient responding well to diuresis, negative fluid balance since admission, 3,237 with urine output over last 24 hours at 2,500. Significant improvement on her symptoms. Follow echocardiography showed reduction on LV systolic function from 55 to 60% with no  wall motion abnormalities (04/13/2017) to 45 to 50% with diffuse hypokinesis (05/04/2017). Apparently patient has been asymptomatic from heart failure, not requiring furosemide until this admission. Her functional physical capacity had significantly decreased due to weakness but no chest pain. Continue coreg, hydralazine and isosorbide, ARB held per nephrology as outpatient.   Considering drop in EF along with wall motion abnormalities with acute heart failure symptoms will consult cardiology, patient may benefit from non invasive ischemic workup or an outpatient follow up echocardiogram.   2. Cardiogenic pulmonary edema. Clinically improving, has responded well to diuresis, will continue oxymetry monitoring and supplemental 02 per [place].   3. Acute kidney injury chronic kidney disease stage IV. Old records personally reviewed noted base cr at 3.0 cw calculated GFR of 20. Will hold on furosemide today and will follow up renal panel in am. Patient will be discharged home on furosemide po. Will need to evaluate dose. Certainly will recommend to avoid contrast or other nephrotoxic agents at this point.   4. Coronary disease with ischemic cardiomyopathy. s/p CABG (LIMA-LAD, SVG-diagonal, SVG-OM1/OM2). Patient has been asymptomatic until now, her physical functional capacity has decreased due to dyspnea. May need further non invasive ischemic workup. Continue aspirin and crestor  5. Hypertension.Will continue blood pressure control with coreg, hydralazine and isosorbide.  6. T2DM. Will continue insulin for glucose coverage and monitoring. Capillary glucose  170, 127, 125, 179.    DVT prophylaxis: heparin  Code Status:  full Family Communication:  No family at the beside Disposition Plan:  Home   Consultants:    Cardiology   Procedures:     Antimicrobials:       Subjective: Patient feeling better, dyspnea has improved, along with lower extremity edema, no orthopnea or pnd.    Objective: Vitals:  05/05/17 1418 05/05/17 2126 05/06/17 0433 05/06/17 0534  BP: (!) 160/68 (!) 150/60 (!) 161/73 (!) 151/60  Pulse: 89 85 81   Resp:  19 17   Temp: 98.3 F (36.8 C) 98.4 F (36.9 C) 98.3 F (36.8 C)   TempSrc: Oral Oral Oral   SpO2: 94% 96% 98%   Weight:   81.4 kg (179 lb 8 oz)   Height:        Intake/Output Summary (Last 24 hours) at 05/06/2017 1114 Last data filed at 05/06/2017 0835 Gross per 24 hour  Intake 960 ml  Output 3200 ml  Net -2240 ml   Filed Weights   05/04/17 0651 05/05/17 0300 05/06/17 0433  Weight: 81.8 kg (180 lb 6.4 oz) 81.8 kg (180 lb 6.4 oz) 81.4 kg (179 lb 8 oz)    Examination:   General: Not in pain or dyspnea.  Neurology: Awake and alert, non focal  E ENT: no pallor, no icterus, oral mucosa moist Cardiovascular: No JVD. S1-S2 present, rhythmic, no gallops, rubs, or murmurs. Trace lower extremity edema. Pulmonary: vesicular breath sounds bilaterally, adequate air movement, no wheezing, rhonchi or rales. Gastrointestinal. Abdomen protuberant, no organomegaly, non tender, no rebound or guarding Skin. No rashes Musculoskeletal: no joint deformities     Data Reviewed: I have personally reviewed following labs and imaging studies  CBC: Recent Labs  Lab 05/03/17 1951 05/04/17 0637 05/05/17 0455  WBC 12.0* 9.2 9.7  NEUTROABS 8.1*  --   --   HGB 11.9* 10.6* 11.0*  HCT 37.1 32.9* 33.5*  MCV 91.6 91.4 91.0  PLT 243 231 476   Basic Metabolic Panel: Recent Labs  Lab 05/03/17 1951 05/04/17 0119 05/04/17 0637 05/05/17 0455 05/06/17 0336  NA 139  --  138 139 140  K 3.8  --  3.3* 3.6 3.3*  CL 106  --  104 103 104  CO2 19*  --  21* 22 24  GLUCOSE 146*  --  210* 281* 75  BUN 73*  --  73* 76* 79*  CREATININE 2.91* 3.07* 3.08* 3.11* 3.49*  CALCIUM 9.3  --  9.0 9.2 8.8*   GFR: Estimated Creatinine Clearance: 15.6 mL/min (A) (by C-G formula based on SCr of 3.49 mg/dL (H)). Liver Function Tests: Recent Labs  Lab  05/04/17 0637 05/05/17 0455  AST 18 21  ALT 15 16  ALKPHOS 65 67  BILITOT 0.6 0.4  PROT 5.7* 5.7*  ALBUMIN 3.2* 3.2*   No results for input(s): LIPASE, AMYLASE in the last 168 hours. No results for input(s): AMMONIA in the last 168 hours. Coagulation Profile: No results for input(s): INR, PROTIME in the last 168 hours. Cardiac Enzymes: Recent Labs  Lab 05/04/17 0119 05/04/17 0637 05/04/17 1105  TROPONINI 0.08* 0.08* 0.07*   BNP (last 3 results) No results for input(s): PROBNP in the last 8760 hours. HbA1C: No results for input(s): HGBA1C in the last 72 hours. CBG: Recent Labs  Lab 05/05/17 1657 05/05/17 2125 05/06/17 0454 05/06/17 0732 05/06/17 1056  GLUCAP 158* 127* 79 125* 279*   Lipid Profile: Recent Labs    05/04/17 0119  CHOL 130  HDL 34*  LDLCALC 74  TRIG 111  CHOLHDL 3.8   Thyroid Function Tests: No results for input(s): TSH, T4TOTAL, FREET4, T3FREE, THYROIDAB in the last 72 hours. Anemia Panel: No results for input(s): VITAMINB12, FOLATE, FERRITIN, TIBC, IRON, RETICCTPCT in the last 72 hours.    Radiology Studies: I have reviewed all of the imaging during this hospital visit personally  Scheduled Meds: . allopurinol  100 mg Oral BID  . anastrozole  1 mg Oral Daily  . aspirin  325 mg Oral QHS  . carvedilol  25 mg Oral BID WC  . cholecalciferol  2,000 Units Oral Daily  . docusate sodium  100 mg Oral QHS  . heparin  5,000 Units Subcutaneous Q8H  . hydrALAZINE  50 mg Oral Q8H  . insulin aspart  0-5 Units Subcutaneous QHS  . insulin aspart  0-9 Units Subcutaneous TID WC  . insulin aspart protamine- aspart  20 Units Subcutaneous BID WC  . isosorbide dinitrate  10 mg Oral TID  . multivitamin  1 tablet Oral Daily  . polyethylene glycol  17 g Oral Daily  . rosuvastatin  20 mg Oral Daily   Continuous Infusions:   LOS: 3 days        Pamela Lax Gerome Apley, MD [facility] Pager [phone]

## 2017-05-06 NOTE — Consult Note (Signed)
Cardiology Consultation:   Patient ID: [id number] [name]; [id number]; 25-Jan-1946   Admit date: 05/03/2017 Date of Consult: 05/06/2017  Primary Care Provider: Unk Pinto, MD Primary Cardiologist: Dr. Johnsie Cancel  Chief Complaint: shortness of breath  Patient Profile:   [name] is a 72 y.o. female with a hx of CAD (NSTEMI 2011 s/p CABG), chronic combined CHF/ICM, CKD stage IV ((baseline Cr around 2.7-3.1), HTN, HLD, DM, LE PAD (patient previously elected hold off L fem-pop), foot ulcer/infection, osteomyelitis of the left toe, LBBB, arthritis, breast CA s/p lumpectomy/radiation, gout who is being seen today for the evaluation of CHF at the request of Dr. Cathlean Sauer.  History of Present Illness:   At time of MI/CABG in 2011, EF was 40-45%. In September she reports her nephrologist Dr. Hollie Salk discontinued her losartan and changed her to hydralazine due to declining renal function. She saw Rosaria Ferries 02/2018 for pre-op eval for toe amputation at which time she was reporting generally decreased stamina but no specific symptoms. 2D echo 1/7//19 showed EF 55-60%. The patient presented this admission with worsening SOB/DOE as well as edema of her lower extremities. Repeat echo 05/04/17 showed mild LVH, EF 45-50%, diffuse HK, grade 2 DD, calcified mitral annulus with moderate MR, mildly dilated LA, normal RV. Initial labs notable for Cr 2.9, troponin 0.07, BNP 784, leukocytosis WBC 12k, Hgb 11.9. Troponins low/flat, peak 0.08. CXR shows cardiomegaly with interstitial opacities bilaterally favored to represent edema; atypical infection could appear similar. As an outpatient her home antihypertensives were carvedilol 12.5mg  BID, HCTZ and hydralazine BID. HCTZ was held and she was diuresed with IV Lasix. Although weight only changed by 1 lb, she reports brisk UOP and is down 4L. Hydralazine and carvedilol were increased and isordil was added. With these measure she reports marked improvement and now feels  back to baseline. She states she's walked the halls 4x today and feels great. No chest pain either now or recently. She does admit to laxity with her diet/sodium restriction over the holidays.   With diuresis, her creatinine has progressed to 3.49 and further Lasix is on hold by primary team.  Past Medical History:  Diagnosis Date  . Allergy   . Arthritis    "maybe in my lower back" (08/27/2012)  . 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 Chronic combined systolic and diastolic CHF (congestive heart failure) ([place])   . CKD (chronic kidney disease), stage IV ([place])   . Coronary artery disease    a. NSTEMI in 05/2009 s/p CABG (LIMA-LAD, SVG-diagonal, SVG-OM1/OM 2).   . Family history of anesthesia complication    Mom has a hard time to wake up  . Foot ulcer ([place])    "I've had them on both feet" (08/27/2012)  . Gouty arthritis    "right index finger" (08/27/2012)  . History of radiation therapy 06/09/13-07/06/13   left breast 50Gy  . Hyperlipidemia   . Hypertension   . Infection    right second toe  . Ischemic cardiomyopathy    a. 2011: EF 40-45%. b. EF 55-60% in 04/2017 but shortly after as inpatient was 45-50%.  Marland Kitchen LBBB (left bundle branch block)   . Mitral regurgitation   . Neuropathy    Hx; of B/L feet  . NSTEMI (non-ST elevated myocardial infarction) ([place]) 05/23/2009  . Osteomyelitis of foot ([place])   . PAD (peripheral artery disease) ([place])    a. LE PAD (patient previously elected hold off L fem-pop), prev  followed by Dr. Bridgett Larsson  . PONV (postoperative nausea and vomiting)   . Sinus headache    "occasionally" (08/27/2012)  . Type II diabetes mellitus ([place])   . Vitamin D deficiency     Past Surgical History:  Procedure Laterality Date  . AMPUTATION Right 08/27/2012   Procedure: AMPUTATION RAY;  Surgeon: Newt Minion, MD;  Location: [facility];  Service: Orthopedics;  Laterality: Right;  Right Foot 5th Ray Amputation  . AMPUTATION Left 07/08/2013    Procedure: AMPUTATION RAY;  Surgeon: Newt Minion, MD;  Location: [facility];  Service: Orthopedics;  Laterality: Left;  Left Foot 2nd Ray Amputation  . AMPUTATION RAY Right 08/27/2012   5th ray/notes 08/27/2012  . AMPUTATION TOE Right 03/06/2017   Procedure: AMPUTATION TOE, INTERPHANGEAL 2ND RIGHT;  Surgeon: [name], DPM;  Location: [facility];  Service: Podiatry;  Laterality: Right;  . BREAST LUMPECTOMY  04/20/2013   with biopsy      DR WAKEFIELD  . BREAST LUMPECTOMY WITH NEEDLE LOCALIZATION AND AXILLARY SENTINEL LYMPH NODE BX Left 04/20/2013   Procedure: LEFT BREAST WIRE GUIDED LUMPECTOMY AND AXILLARY SENTINEL LYMPH NODE BX;  Surgeon: Rolm Bookbinder, MD;  Location: [facility];  Service: General;  Laterality: Left;  . CARDIAC CATHETERIZATION  05/24/2009   Archie Endo 05/24/2009 (08/27/2012)  . CATARACT EXTRACTION W/ INTRAOCULAR LENS  IMPLANT, BILATERAL  2000's  . COLONOSCOPY W/ BIOPSIES AND POLYPECTOMY  2010  . CORONARY ARTERY BYPASS GRAFT  2011   "CABG X4" (08/27/2012)  . DENTAL SURGERY  04/30/11   "1 implant" (08/27/2012)  . DILATION AND CURETTAGE OF UTERUS    . EYE SURGERY    . FINGER SURGERY Right    "index finger; turned out to be gout" (08/27/2012)     Inpatient Medications: Scheduled Meds: . allopurinol  100 mg Oral BID  . anastrozole  1 mg Oral Daily  . aspirin  325 mg Oral QHS  . atorvastatin  80 mg Oral q1800  . carvedilol  25 mg Oral BID WC  . cholecalciferol  2,000 Units Oral Daily  . docusate sodium  100 mg Oral QHS  . heparin  5,000 Units Subcutaneous Q8H  . hydrALAZINE  75 mg Oral Q8H  . insulin aspart  0-5 Units Subcutaneous QHS  . insulin aspart  0-9 Units Subcutaneous TID WC  . insulin aspart protamine- aspart  20 Units Subcutaneous BID WC  . isosorbide dinitrate  10 mg Oral TID  . multivitamin  1 tablet Oral Daily  . polyethylene glycol  17 g Oral Daily  . potassium chloride  40 mEq Oral Once   Continuous Infusions:  PRN Meds: acetaminophen **OR** acetaminophen,  fluticasone, hydrALAZINE, polyvinyl alcohol, sodium chloride, sodium chloride flush  Home Meds: Prior to Admission medications   Medication Sig Start Date End Date Taking? Authorizing Provider  allopurinol (ZYLOPRIM) 100 MG tablet TAKE 1 TABLET TWICE DAILY Patient taking differently: Take 100 mg by mouth two times a day 12/22/16  Yes Unk Pinto, MD  anastrozole (ARIMIDEX) 1 MG tablet Take 1 tablet (1 mg total) by mouth daily. 02/02/17  Yes Magrinat, Virgie Dad, MD  aspirin 325 MG tablet Take 325 mg by mouth at bedtime.    Yes [provider]  aspirin-acetaminophen-caffeine (EXCEDRIN MIGRAINE) [phone] MG tablet Take 1 tablet by mouth daily as needed (for sinus headaches.).   Yes [provider]  beta carotene w/minerals (OCUVITE) tablet Take 1 tablet by mouth daily.   Yes [provider]  carvedilol (COREG) 12.5  MG tablet TAKE 1 TABLET TWICE DAILY FOR BLOOD PRESSURE Patient taking differently: Take 12.5 mg by mouth two times a day 12/17/16  Yes Unk Pinto, MD  Cholecalciferol (VITAMIN D3) 2000 units TABS Take 2,000 Units by mouth daily.    Yes [provider]  docusate sodium (COLACE) 100 MG capsule Take 100 mg by mouth at bedtime.   Yes [provider]  fluticasone (FLONASE) 50 MCG/ACT nasal spray Place 2 sprays into both nostrils daily as needed for allergies or rhinitis.   Yes [provider]  hydrALAZINE (APRESOLINE) 50 MG tablet Take 50 mg by mouth 2 (two) times daily.   Yes [provider]  hydrochlorothiazide (HYDRODIURIL) 12.5 MG tablet TAKE 1 TABLET EVERY DAY Patient taking differently: Take 12.5 mg by mouth once a day 08/22/16  Yes Unk Pinto, MD  insulin NPH-regular Human (NOVOLIN 70/30) (70-30) 100 UNIT/ML injection Inject 30-65 Units into the skin See admin instructions. 60-65 units into the skin in the morning before breakfast & 30-35 units in the evening, depending on BGL   Yes [provider]    Stanton Tonic and Homeopathic Remedy: Mix 1 ounce with water and drink one to two times a day (5-6 times a week)   Yes [provider]  Polyethyl Glycol-Propyl Glycol (LUBRICANT EYE DROPS) 0.4-0.3 % SOLN Place 1-2 drops into both eyes 3 (three) times daily as needed (for dry eyes.).   Yes [provider]  rosuvastatin (CRESTOR) 20 MG tablet TAKE 1 TABLET EVERY DAY Patient taking differently: Take 20 mg by mouth in the evening 01/12/17  Yes Vicie Mutters, PA-C    Allergies:    Allergies  Allergen Reactions  . Atenolol Other (See Comments)    Exacerbates gout  . Contrast Media [Iodinated Diagnostic Agents] Rash  . Latex Rash  . Omnipaque [Iohexol] Rash    Social History:   Social History   Socioeconomic History  . Marital status: Married    Spouse name: [name]  . Number of children: 1  . Years of education: Not on file  . Highest education level: Not on file  Social Needs  . Financial resource strain: Not on file  . Food insecurity - worry: Not on file  . Food insecurity - inability: Not on file  . Transportation needs - medical: Not on file  . Transportation needs - non-medical: Not on file  Occupational History  . Not on file  Tobacco Use  . Smoking status: Never Smoker  . Smokeless tobacco: Never Used  Substance and Sexual Activity  . Alcohol use: Yes    Comment: rarely drinks wine  . Drug use: No  . Sexual activity: Not Currently  Other Topics Concern  . Not on file  Social History Narrative  . Not on file    Family History:   The patient's family history includes Breast cancer in her maternal aunt; Breast cancer (age of onset: 49) in her sister; Breast cancer (age of onset: 19) in her sister; Hypertension in her brother; Kidney cancer (age of onset: 26) in her brother; Throat cancer (age of onset: 6) in her father.  ROS:  Please see the history of present illness.  All other ROS reviewed and negative.     Physical  Exam/Data:   Vitals:   05/06/17 0433 05/06/17 0534 05/06/17 1412 05/06/17 1552  BP: (!) 161/73 (!) 151/60 139/61 (!) 159/71  Pulse: 81  79   Resp: 17     Temp:  98.3 F (36.8 C)  98.6 F (37 C)   TempSrc: Oral  Oral   SpO2: 98%  98%   Weight: 179 lb 8 oz (81.4 kg)     Height:        Intake/Output Summary (Last 24 hours) at 05/06/2017 1630 Last data filed at 05/06/2017 1205 Gross per 24 hour  Intake 480 ml  Output 2300 ml  Net -1820 ml   Filed Weights   05/04/17 0651 05/05/17 0300 05/06/17 0433  Weight: 180 lb 6.4 oz (81.8 kg) 180 lb 6.4 oz (81.8 kg) 179 lb 8 oz (81.4 kg)   Body mass index is 29.87 kg/m.  General: Well developed, well nourished WF in no acute distress. Head: Normocephalic, atraumatic, sclera non-icteric, no xanthomas, nares are without discharge.  Neck: Negative for carotid bruits. JVD not elevated. Lungs: Clear bilaterally to auscultation without wheezes, rales, or rhonchi. Breathing is unlabored. Heart: RRR with S1 S2. No murmurs, rubs, or gallops appreciated. Abdomen: Soft, non-tender, non-distended with normoactive bowel sounds. No hepatomegaly. No rebound/guarding. No obvious abdominal masses. Msk:  Strength and tone appear normal for age. Extremities: No clubbing or cyanosis. No edema.  Distal pedal pulses are 2+ and equal bilaterally. Neuro: Alert and oriented X 3. No facial asymmetry. No focal deficit. Moves all extremities spontaneously. Psych:  Responds to questions appropriately with a normal affect.  EKG:  The EKG was personally reviewed and demonstrates NSR 86bpm LBBB, nonspecific ST-T changes similar to prior  Relevant CV Studies: 2D Echo 05/04/17 Study Conclusions  - Left ventricle: The cavity size was normal. Wall thickness was   increased in a pattern of mild LVH. Systolic function was mildly   reduced. The estimated ejection fraction was in the range of 45%   to 50%. Diffuse hypokinesis. Features are consistent with a   pseudonormal  left ventricular filling pattern, with concomitant   abnormal relaxation and increased filling pressure (grade 2   diastolic dysfunction). - Aortic valve: Poorly visualized. Probably trileaflet; mildly   calcified leaflets. There was no stenosis. - Mitral valve: Severely calcified annulus. Moderately calcified   leaflets . The findings are consistent with trivial stenosis.   There was moderate regurgitation. Pressure half-time: 77 ms. Mean   gradient (D): 6 mm Hg. - Left atrium: The atrium was mildly dilated. - Right ventricle: The cavity size was normal. Systolic function   was normal. - Pulmonary arteries: PA peak pressure: 32 mm Hg (S). - Inferior vena cava: The vessel was normal in size. The   respirophasic diameter changes were in the normal range (>= 50%),   consistent with normal central venous pressure.  Impressions:  - Normal LV size with mild LV hypertrophy. EF 45-50%, diffuse   hypokinesis. Normal RV size and systolic function. Heavily   calcified mitral valve and annulus. There was moderate mitral   regurgitation and probably only trivial mitral stenosis (mean   gradient 6 mmHg but PHT 77 msec suggesting valve area in normal   range).   Laboratory Data:  Chemistry Recent Labs  Lab 05/04/17 0637 05/05/17 0455 05/06/17 0336  NA 138 139 140  K 3.3* 3.6 3.3*  CL 104 103 104  CO2 21* 22 24  GLUCOSE 210* 281* 75  BUN 73* 76* 79*  CREATININE 3.08* 3.11* 3.49*  CALCIUM 9.0 9.2 8.8*  GFRNONAA 14* 14* 12*  GFRAA 16* 16* 14*  ANIONGAP 13 14 12     Recent Labs  Lab 05/04/17 0637 05/05/17 0455  PROT 5.7* 5.7*  ALBUMIN 3.2* 3.2*  AST 18 21  ALT 15 16  ALKPHOS 65 67  BILITOT 0.6 0.4   Hematology Recent Labs  Lab 05/03/17 1951 05/04/17 0637 05/05/17 0455  WBC 12.0* 9.2 9.7  RBC 4.05 3.60* 3.68*  HGB 11.9* 10.6* 11.0*  HCT 37.1 32.9* 33.5*  MCV 91.6 91.4 91.0  MCH 29.4 29.4 29.9  MCHC 32.1 32.2 32.8  RDW 15.6* 16.1* 16.0*  PLT 243 231 219    Cardiac Enzymes Recent Labs  Lab 05/04/17 0119 05/04/17 0637 05/04/17 1105  TROPONINI 0.08* 0.08* 0.07*    Recent Labs  Lab 05/03/17 1937  TROPIPOC 0.07    BNP Recent Labs  Lab 05/03/17 1951  BNP 784.6*    DDimer No results for input(s): DDIMER in the last 168 hours.  Radiology/Studies:  Dg Chest 2 View  Result Date: 05/03/2017 CLINICAL DATA:  Patient with shortness of breath and chest tightness. EXAM: CHEST  2 VIEW COMPARISON:  Chest radiograph 04/12/2013 FINDINGS: Monitoring leads overlie the patient. Stable cardiomegaly status post median sternotomy. Aortic atherosclerosis. Bilateral interstitial pulmonary opacities. Small bilateral pleural effusions. Thoracic spine degenerative changes. IMPRESSION: Cardiomegaly with interstitial opacities bilaterally favored to represent edema. Atypical infection could appear similar. Electronically Signed   By: Lovey Newcomer M.D.   On: 05/03/2017 20:50    Assessment and Plan:   1. Acute on chronic combined CHF - suspect precipitated by poorly controlled blood pressure and dietary indiscretion. She has diuresed 4L and feels symptomatically significantly improved. Agree with holding diuretic until trajectory of renal function is known. I will discuss reduced EF with Dr. Acie Fredrickson although I am not certain at present time that it would be wise to proceed with ischemic evaluation. Outside of this exacerbation, she has not had recent chest pain or escalating dyspnea. She is not a good candidate for cardiac cath given her advanced renal disease and she expresses a desire to avoid putting herself at risk for needing dialysis. Agree with medical therapy. Continue aspirin (will defer to IM about reducing to 81mg  daily). Continue increased dose of carvedilol. Max recommended dose of Crestor in patients with CrCl <30 is 10mg  per day. As LDL is not at goal of <70, will change to atorvastatin 80mg  daily. If the patient is tolerating statin at time of follow-up  appointment, would consider rechecking liver function/lipid panel in 6-8 weeks. Not a candidate for ACEI/ARB/spiro/ARNI at this point with her renal disease. Titrate hydralazine today. Reviewed 2g sodium restriction, 2L fluid restriction with patient. I suspect it would be worthwhile for internal medicine to touch base with renal team at discharge about plan for close follow-up and diuretic dosing.  2. Moderate mitral regurgitation - follow clinically for now; further decisions can be made as outpatient about surveillance.  3. CAD s/p CABG 2016 with minimal troponin elevation - as above. Peak troponin 0.08. Anticipate we will continue to treat medically for now, but will review definitive recs with MD.  4. Poorly controlled HTN - carvedilol was just doubled this morning, but blood pressure is still running high this afternoon. Will increase hydralazine to 75mg  TID.  5. AKI on CKD IV with hypokalemia - as above. Will defer lytes to IM.  For questions or updates, please contact [facility] Please consult [email or web address] for contact info under Cardiology/STEMI.    Signed, [name] Pitter, PA-C  05/06/2017 4:30 PM   Attending Note:   The patient was seen and examined.  Agree with assessment and plan as noted above.  Changes  made to the above note as needed.  Patient seen and independently examined with  Melina Copa, PA .   We discussed all aspects of the encounter. I agree with the assessment and plan as stated above.  1.  Acute on chronic congestive heart failure: Patient is doing quite a bit better.  She seems to be feeling quite a bit better.  We have gradually titrated up her hydralazine.  Her heart rate is still a little fast.  She is on carvedilol 25 mg twice a day.  Her creatinine has gradually increased over the past 3 days.  I would defer to nephrology as to the dosing of her diuretics.  From our standpoint she seems to be doing very well.  Continue current medications.  Will make  further adjustments as an outpatient.   I have spent a total of 40 minutes with patient reviewing hospital  notes , telemetry, EKGs, labs and examining patient as well as establishing an assessment and plan that was discussed with the patient. > 50% of time was spent in direct patient care.    Thayer Headings, Brooke Bonito., MD, [facility] 05/06/2017, 5:29 PM [address]  [place] Pager [phone]

## 2017-05-07 ENCOUNTER — Telehealth: Payer: Self-pay | Admitting: Nurse Practitioner

## 2017-05-07 DIAGNOSIS — N184 Chronic kidney disease, stage 4 (severe): Secondary | ICD-10-CM

## 2017-05-07 DIAGNOSIS — E1022 Type 1 diabetes mellitus with diabetic chronic kidney disease: Secondary | ICD-10-CM

## 2017-05-07 DIAGNOSIS — R0902 Hypoxemia: Secondary | ICD-10-CM

## 2017-05-07 LAB — GLUCOSE, CAPILLARY
GLUCOSE-CAPILLARY: 126 mg/dL — AB (ref 65–99)
Glucose-Capillary: 146 mg/dL — ABNORMAL HIGH (ref 65–99)

## 2017-05-07 LAB — BASIC METABOLIC PANEL
Anion gap: 11 (ref 5–15)
BUN: 76 mg/dL — AB (ref 6–20)
CALCIUM: 8.8 mg/dL — AB (ref 8.9–10.3)
CHLORIDE: 106 mmol/L (ref 101–111)
CO2: 22 mmol/L (ref 22–32)
CREATININE: 3.44 mg/dL — AB (ref 0.44–1.00)
GFR calc non Af Amer: 12 mL/min — ABNORMAL LOW (ref >60)
GFR, EST AFRICAN AMERICAN: 14 mL/min — AB (ref >60)
Glucose, Bld: 116 mg/dL — ABNORMAL HIGH (ref 65–99)
Potassium: 3.7 mmol/L (ref 3.5–5.1)
Sodium: 139 mmol/L (ref 135–145)

## 2017-05-07 MED ORDER — CARVEDILOL 25 MG PO TABS: 25.0000 mg | ORAL_TABLET | Freq: Two times a day (BID) | ORAL | 0 refills | Status: DC

## 2017-05-07 MED ORDER — ATORVASTATIN CALCIUM 80 MG PO TABS: 80.0000 mg | ORAL_TABLET | Freq: Every day | ORAL | 0 refills | Status: DC

## 2017-05-07 MED ORDER — HYDRALAZINE HCL 25 MG PO TABS: 75.0000 mg | ORAL_TABLET | Freq: Three times a day (TID) | ORAL | 0 refills | Status: DC

## 2017-05-07 MED ORDER — ISOSORBIDE DINITRATE 10 MG PO TABS: 10.0000 mg | ORAL_TABLET | Freq: Three times a day (TID) | ORAL | 0 refills | Status: DC

## 2017-05-07 MED ORDER — FUROSEMIDE 40 MG PO TABS: 40.0000 mg | ORAL_TABLET | Freq: Every day | ORAL | 0 refills | Status: DC

## 2017-05-07 NOTE — Discharge Summary (Signed)
Physician Discharge Summary  [name]:[id number] DOB: 08-10-1945 DOA: 05/03/2017  PCP: Unk Pinto, MD  Admit date: 05/03/2017 Discharge date: 05/07/2017  Admitted From: Home Disposition:  Home  Recommendations for Outpatient Follow-up/ medication changes:  1. Follow up with PCP in 1- week 2. Patient has been placed on Furosemide 40 mg daily for diuresis, patient advised to increase dose if weigh gain 2 lbs in 24 hours or 5 lbs in one week and to inform her primary care provider.  3. Discontinue HCTZ  4. Increased dose of Carvedilol and Hydralazine 5. Added Isosorbide 6. Changed Rosuvastatin to Atorvastatin  Home Health: no  Equipment/Devices: no   Discharge Condition: Stable CODE STATUS: Full  Diet recommendation: Heart healthy and diabetic prudent  Brief/Interim Summary: 72 year old female who presented with dyspnea. She does have a significant past medical history of hypertension, dyslipidemia, coronary artery disease status post bypass grafting 2011, peripheral vascular disease, and diastolic heart failure. For last 3 days prior to hospitalization, she developed worsening dyspnea, orthopnea and lower extremity edema along with weight gain 5 to 7 lbs. On initial physical examination blood pressure 182/85, heart rate 81, temperature 97.8, respiratory rate 28, oxygen saturations 100% on supplemental 02. No JVD, her lungs had rales bilaterally more right than left, no wheezing, heart S1-S2 present rhythmic, no gallops, rubs or murmurs, abdomen was soft nontender, positive lower extremity edema. Sodium 139, potassium 3.8, bicarb 27, bicarbonate 19, glucose 146, BUN 73, creatinine 2.91, troponin 0.08, BNP 784, white count 12.0, hemoglobin 10.9, hematocrit 37.1, platelets 243. Chest x-ray with cardiomegaly, increased insteritial markings bilaterally, consistent with pulmonary edema, EKG was sinus rhythm, left axis deviation, left bundle branch block, septal Q waves (chronic  changes).   Patient was admitted to the hospital with the working diagnosis acute on chronic diastolic heart failure decompensation, complicated by cardiogenic pulmonary edema.  1. Acute on chronic diastolic heart failure exacerbation. Patient was admitted to medical workup, she was placed on remote telemetry monitor, aggressive diuresis with IV furosemide, negative fluid balance was achieved, - 4,967 ml, since admission with significant improvement of her symptoms. Follow-up echocardiography show a decrease in function, from a recent study 04/13/2017, from 55-60% ejection fraction to 45-50% ejection fraction, and describing global hypokinesis, that was not present on initial study. Heart failure regimen was adjusted, dose of hydralazine and carvedilol have been increased, and isosorbide added. Outpatient follow-up with cardiology clinic.   2. Cardiogenic pulmonary edema with acute hypoxic respiratory failure. Patient responded well to medical therapy, symptoms improved with diuresis, at discharge oxygen saturation 96% room air.   3. Acute kidney injury on chronic kidney disease stage IV. Patient tolerated well diuresis, replace creatinine is 3.0 calculate GFR of 20. Patient will be discharged on 40 mg of furosemide daily, her discharge creatinine has been stable over last 48 hours at 3.4. Discharge potassium 3.7, serum bicarbonate 22. Patient will follow up with outpatient nephrology clinic.   4. Coronary artery disease with ischemic cardiomyopathy. s/pCABG (LIMA-LAD, SVG-diagonal, SVG-OM1/OM2). Patient with decreased functional physical activity, no frank angina symptoms, will follow-up as an outpatient echocardiography, no signs of current or recent acute coronary syndrome. Continue aspirin and atorvastatin.   5. Hypertension. Blood pressure agents were optimized, increased dose of hydralazine and carvedilol, added isosorbide. Discharge systolic blood pressure 188-416 mmHg.   6. Type 2 diabetes  mellitus. Patient was placed on insulin sliding-scale for glucose coverage and monitor, capillary glucose remained well controlled. Continue insulin 70 30, 20 units twice daily  Discharge  Diagnoses:  Principal Problem:   CHF (congestive heart failure) (HCC) Active Problems:   CKD stage 3 due to type 2 diabetes mellitus ([place])   Diabetes mellitus type 2, insulin dependent ([place])   Anemia    Discharge Instructions   Allergies as of 05/07/2017      Reactions   Atenolol Other (See Comments)   Exacerbates gout   Contrast Media [iodinated Diagnostic Agents] Rash   Latex Rash   Omnipaque [iohexol] Rash      Medication List    STOP taking these medications   hydrochlorothiazide 12.5 MG tablet Commonly known as:  HYDRODIURIL   rosuvastatin 20 MG tablet Commonly known as:  CRESTOR     TAKE these medications   allopurinol 100 MG tablet Commonly known as:  ZYLOPRIM TAKE 1 TABLET TWICE DAILY What changed:    how much to take  how to take this  when to take this   anastrozole 1 MG tablet Commonly known as:  ARIMIDEX Take 1 tablet (1 mg total) by mouth daily.   aspirin 325 MG tablet Take 325 mg by mouth at bedtime.   aspirin-acetaminophen-caffeine 250-250-65 MG tablet Commonly known as:  EXCEDRIN MIGRAINE Take 1 tablet by mouth daily as needed (for sinus headaches.).   atorvastatin 80 MG tablet Commonly known as:  LIPITOR Take 1 tablet (80 mg total) by mouth daily at 6 PM.   beta carotene w/minerals tablet Take 1 tablet by mouth daily.   carvedilol 25 MG tablet Commonly known as:  COREG Take 1 tablet (25 mg total) by mouth 2 (two) times daily with a meal. What changed:    medication strength  See the new instructions.   docusate sodium 100 MG capsule Commonly known as:  COLACE Take 100 mg by mouth at bedtime.   fluticasone 50 MCG/ACT nasal spray Commonly known as:  FLONASE Place 2 sprays into both nostrils daily as needed for allergies or rhinitis.    furosemide 40 MG tablet Commonly known as:  LASIX Take 1 tablet (40 mg total) by mouth daily.   hydrALAZINE 25 MG tablet Commonly known as:  APRESOLINE Take 3 tablets (75 mg total) by mouth every 8 (eight) hours. What changed:    medication strength  how much to take  when to take this   insulin NPH-regular Human (70-30) 100 UNIT/ML injection Commonly known as:  NOVOLIN 70/30 Inject 30-65 Units into the skin See admin instructions. 60-65 units into the skin in the morning before breakfast & 30-35 units in the evening, depending on BGL   isosorbide dinitrate 10 MG tablet Commonly known as:  ISORDIL Take 1 tablet (10 mg total) by mouth 3 (three) times daily.   LUBRICANT EYE DROPS 0.4-0.3 % Soln Generic drug:  Polyethyl Glycol-Propyl Glycol Place 1-2 drops into both eyes 3 (three) times daily as needed (for dry eyes.).   NON FORMULARY [employer] Health Tonic and Homeopathic Remedy: Mix 1 ounce with water and drink one to two times a day (5-6 times a week)   Vitamin D3 2000 units Tabs Take 2,000 Units by mouth daily.      Follow-up Information    Burtis Junes, NP Follow up.   Specialties:  Nurse Practitioner, Interventional Cardiology, Cardiology, Radiology Why:  [place] office - 05/18/17 at 11am. Arrive 15 minutes prior to appointment to check in. Cecille Rubin is one of the nurse practitioners that is part of Dr. Kyla Balzarine care team. Contact information: [address] [place]  [place] [address] [phone]          Allergies  Allergen Reactions  . Atenolol Other (See Comments)    Exacerbates gout  . Contrast Media [Iodinated Diagnostic Agents] Rash  . Latex Rash  . Omnipaque [Iohexol] Rash    Consultations:  Cardiology    Procedures/Studies: Dg Chest 2 View  Result Date: 05/03/2017 CLINICAL DATA:  Patient with shortness of breath and chest tightness. EXAM: CHEST  2 VIEW COMPARISON:  Chest radiograph 04/12/2013 FINDINGS:  Monitoring leads overlie the patient. Stable cardiomegaly status post median sternotomy. Aortic atherosclerosis. Bilateral interstitial pulmonary opacities. Small bilateral pleural effusions. Thoracic spine degenerative changes. IMPRESSION: Cardiomegaly with interstitial opacities bilaterally favored to represent edema. Atypical infection could appear similar. Electronically Signed   By: Lovey Newcomer M.D.   On: 05/03/2017 20:50   Dg Foot Complete Right  Result Date: 04/08/2017 Please see detailed radiograph report in office note.      Subjective: Patient feeling better, dyspnea has improved, no nausea or vomiting, no chest pain, no orthopnea or pnd.   Discharge Exam: Vitals:   05/07/17 0435 05/07/17 0858  BP: (!) 145/68 (!) 151/61  Pulse: 77 78  Resp: 20   Temp: 98.4 F (36.9 C)   SpO2: 96%    Vitals:   05/06/17 1722 05/06/17 2111 05/07/17 0435 05/07/17 0858  BP: (!) 169/76 (!) 149/59 (!) 145/68 (!) 151/61  Pulse: 82 79 77 78  Resp:  18 20   Temp:  98.6 F (37 C) 98.4 F (36.9 C)   TempSrc:  Oral Oral   SpO2:  94% 96%   Weight:   80.9 kg (178 lb 6.4 oz)   Height:        General: Not in pain or dyspnea  Neurology: Awake and alert, non focal  E ENT: no pallor, no icterus, oral mucosa moist Cardiovascular: No JVD. S1-S2 present, rhythmic, no gallops, rubs, or murmurs. No lower extremity edema. Pulmonary: vesicular breath sounds bilaterally, adequate air movement, no wheezing, rhonchi or rales. Gastrointestinal. Abdomen protuberant, no organomegaly, non tender, no rebound or guarding Skin. No rashes Musculoskeletal: no joint deformities   The results of significant diagnostics from this hospitalization (including imaging, microbiology, ancillary and laboratory) are listed below for reference.     Microbiology: Recent Results (from the past 240 hour(s))  MRSA PCR Screening     Status: None   Collection Time: 05/04/17  1:12 AM  Result Value Ref Range Status   MRSA by  PCR NEGATIVE NEGATIVE Final    Comment:        The GeneXpert MRSA Assay (FDA approved for NASAL specimens only), is one component of a comprehensive MRSA colonization surveillance program. It is not intended to diagnose MRSA infection nor to guide or monitor treatment for MRSA infections.      Labs: BNP (last 3 results) Recent Labs    05/03/17 1951  BNP 712.4*   Basic Metabolic Panel: Recent Labs  Lab 05/03/17 1951 05/04/17 0119 05/04/17 0637 05/05/17 0455 05/06/17 0336 05/07/17 0503  NA 139  --  138 139 140 139  K 3.8  --  3.3* 3.6 3.3* 3.7  CL 106  --  104 103 104 106  CO2 19*  --  21* 22 24 22   GLUCOSE 146*  --  210* 281* 75 116*  BUN 73*  --  73* 76* 79* 76*  CREATININE 2.91* 3.07* 3.08* 3.11* 3.49* 3.44*  CALCIUM 9.3  --  9.0 9.2 8.8* 8.8*  Liver Function Tests: Recent Labs  Lab 05/04/17 0637 05/05/17 0455  AST 18 21  ALT 15 16  ALKPHOS 65 67  BILITOT 0.6 0.4  PROT 5.7* 5.7*  ALBUMIN 3.2* 3.2*   No results for input(s): LIPASE, AMYLASE in the last 168 hours. No results for input(s): AMMONIA in the last 168 hours. CBC: Recent Labs  Lab 05/03/17 1951 05/04/17 0637 05/05/17 0455  WBC 12.0* 9.2 9.7  NEUTROABS 8.1*  --   --   HGB 11.9* 10.6* 11.0*  HCT 37.1 32.9* 33.5*  MCV 91.6 91.4 91.0  PLT 243 231 219   Cardiac Enzymes: Recent Labs  Lab 05/04/17 0119 05/04/17 0637 05/04/17 1105  TROPONINI 0.08* 0.08* 0.07*   BNP: Invalid input(s): POCBNP CBG: Recent Labs  Lab 05/06/17 0732 05/06/17 1056 05/06/17 1602 05/06/17 2107 05/07/17 0747  GLUCAP 125* 279* 142* 107* 126*   D-Dimer No results for input(s): DDIMER in the last 72 hours. Hgb A1c No results for input(s): HGBA1C in the last 72 hours. Lipid Profile No results for input(s): CHOL, HDL, LDLCALC, TRIG, CHOLHDL, LDLDIRECT in the last 72 hours. Thyroid function studies No results for input(s): TSH, T4TOTAL, T3FREE, THYROIDAB in the last 72 hours.  Invalid input(s):  FREET3 Anemia work up No results for input(s): VITAMINB12, FOLATE, FERRITIN, TIBC, IRON, RETICCTPCT in the last 72 hours. Urinalysis    Component Value Date/Time   COLORURINE YELLOW 01/22/2017 [place] 01/22/2017 1208   LABSPEC 1.016 01/22/2017 1208   PHURINE 5.5 01/22/2017 [place] 01/22/2017 [employer] 01/22/2017 [place] 10/13/2016 [place] 01/22/2017 1208   PROTEINUR 3+ (A) 01/22/2017 1208   UROBILINOGEN 0.2 05/30/2009 1500   NITRITE NEGATIVE 01/22/2017 1208   LEUKOCYTESUR NEGATIVE 01/22/2017 1208   Sepsis Labs Invalid input(s): PROCALCITONIN,  WBC,  LACTICIDVEN Microbiology Recent Results (from the past 240 hour(s))  MRSA PCR Screening     Status: None   Collection Time: 05/04/17  1:12 AM  Result Value Ref Range Status   MRSA by PCR NEGATIVE NEGATIVE Final    Comment:        The GeneXpert MRSA Assay (FDA approved for NASAL specimens only), is one component of a comprehensive MRSA colonization surveillance program. It is not intended to diagnose MRSA infection nor to guide or monitor treatment for MRSA infections.      Time coordinating discharge: 45 minutes  SIGNED:   Tawni Millers, MD  [facility] 05/07/2017, 11:28 AM Pager [phone]  If 7PM-7AM, please contact night-coverage [email or web address] Password [email or web address]

## 2017-05-07 NOTE — Progress Notes (Addendum)
See consult note; please call with questions. TOC visit scheduled 2/11 with Truitt Merle; appt placed in Epic. Dayna Dunn PA-C  Will sign off.    Mertie Moores, MD  05/07/2017 9:32 AM    [name],  [address] [place], [place]  [address] Pager [phone] Phone: [phone]; Fax: [phone]

## 2017-05-07 NOTE — Care Management Note (Signed)
Case Management Note  Patient Details  Name: ARLEIGH DICOLA MRN: [id number] Date of Birth: 1945/11/12  Subjective/Objective: Pt presented for CHF. Initiated on IV Lasix. PTA independent from home with husband. Plan to return home once stable.                    Action/Plan: CM did speak with pt in regards to transition home. Pt has DME scale and she will start to weigh daily. CM did discuss about [facility] RN and pt felt like she would not need services. Pt is aware that if once she gets home and needs services to contact her PCP. No further needs from CM at this time.   Expected Discharge Date:  05/07/17               Expected Discharge Plan:  [place]  In-House Referral:  NA  Discharge planning Services  CM Consult  Post Acute Care Choice:  NA Choice offered to:  NA  DME Arranged:  N/A DME Agency:  NA  HH Arranged:  NA HH Agency:  NA  Status of Service:  Completed, signed off  If discussed at [place] of Stay Meetings, dates discussed:    Additional Comments:  Bethena Roys, RN 05/07/2017, 12:28 PM

## 2017-05-07 NOTE — Consult Note (Signed)
[facility] CM Primary Care Navigator  05/07/2017  [place] Labo [facility] 30-Aug-1945 [id number]   Met with patientand husband ([name])at the bedside to identify possible discharge needs. Patientreports having"difficulty breathing and swelling of legs and ankles" that resulted to this admission.  Patientendorses Dr. Unk Pinto with [facility] as her primary care provider.   Patient shared using [facility] and [facility] on [address] obtain medications without any problem.   Patientreports that shehas been managing her ownmedications at home using "pill box"systemfilledevery 2 weeks.  Patientmentioned thathusband providestransportation to herdoctors'appointments.  Patientverbalized that she lives with her husband at home. Husband is the primary caregiver as stated.  Anticipated discharge planis homeper patient.  Patientvoiced understanding to callprimary care provider's officewhenshe gets home,for a post discharge follow-upvisit within 1-2 weeks or sooner if needs arise.Patient letter (with PCP's contact number) was provided asareminder.  Patient was admitted to the hospital with the working diagnosis of acute on chronic diastolic heart failure decompensation complicated by cardiogenic pulmonary edema. Heart failure regimen was adjusted and will have outpatient follow-up with cardiology clinic.  Explained to patient about [facility] CM services available for health management at home.Patient was quite reluctant and felt like she would not need any services for now. Patient states having a weighing scale at home but not regularly checking weight. Explained to patient the importance of weighing daily and was encouraged to monitor/ record daily weights and report weight gains as ordered. Discussed with patient about medication adherence, maintaining diet restrictions, exercise and close follow-up with provider  when needed.  Patient would only opt and verbally agree with EMMI HF callsto follow-up with herrecovery at home.  Referral was made for EMMI HFcalls after discharge.  Patient voiced understandingto seekreferralfrom primary care providerto [facility] care management if deemed necessary andappropriate for further services in the future.  [facility] care management information provided for future needs thatshe may have.     For questions, please contact:  Dannielle Huh, BSN, RN- [facility] Primary Care Navigator  Telephone: [phone] [place]

## 2017-05-07 NOTE — Telephone Encounter (Signed)
Patient is presently admitted in hospital, will do TCM call after discharge.

## 2017-05-07 NOTE — Care Management Important Message (Signed)
Important Message  Patient Details  Name: [name] MRN: [id number] Date of Birth: Aug 18, 1945   Medicare Important Message Given:  Yes    [name] 05/07/2017, 12:16 PM

## 2017-05-07 NOTE — Telephone Encounter (Signed)
TOC Patient-Please call Patient-Pt has an appointment with Truitt Merle on 05-18-17.

## 2017-05-07 NOTE — Discharge Instructions (Signed)
Heart Failure °Heart failure means your heart has trouble pumping blood. This makes it hard for your body to work well. Heart failure is usually a long-term (chronic) condition. You must take good care of yourself and follow your doctor's treatment plan. °Follow these instructions at home: °· Take your heart medicine as told by your doctor. °? Do not stop taking medicine unless your doctor tells you to. °? Do not skip any dose of medicine. °? Refill your medicines before they run out. °? Take other medicines only as told by your doctor or pharmacist. °· Stay active if told by your doctor. The elderly and people with severe heart failure should talk with a doctor about physical activity. °· Eat heart-healthy foods. Choose foods that are without trans fat and are low in saturated fat, cholesterol, and salt (sodium). This includes fresh or frozen fruits and vegetables, fish, lean meats, fat-free or low-fat dairy foods, whole grains, and high-fiber foods. Lentils and dried peas and beans (legumes) are also good choices. °· Limit salt if told by your doctor. °· Cook in a healthy way. Roast, grill, broil, bake, poach, steam, or stir-fry foods. °· Limit fluids as told by your doctor. °· Weigh yourself every morning. Do this after you pee (urinate) and before you eat breakfast. Write down your weight to give to your doctor. °· Take your blood pressure and write it down if your doctor tells you to. °· Ask your doctor how to check your pulse. Check your pulse as told. °· Lose weight if told by your doctor. °· Stop smoking or chewing tobacco. Do not use gum or patches that help you quit without your doctor's approval. °· Schedule and go to doctor visits as told. °· Nonpregnant women should have no more than 1 drink a day. Men should have no more than 2 drinks a day. Talk to your doctor about drinking alcohol. °· Stop illegal drug use. °· Stay current with shots (immunizations). °· Manage your health conditions as told by your  doctor. °· Learn to manage your stress. °· Rest when you are tired. °· If it is really hot outside: °? Avoid intense activities. °? Use air conditioning or fans, or get in a cooler place. °? Avoid caffeine and alcohol. °? Wear loose-fitting, lightweight, and light-colored clothing. °· If it is really cold outside: °? Avoid intense activities. °? Layer your clothing. °? Wear mittens or gloves, a hat, and a scarf when going outside. °? Avoid alcohol. °· Learn about heart failure and get support as needed. °· Get help to maintain or improve your quality of life and your ability to care for yourself as needed. °Contact a doctor if: °· You gain weight quickly. °· You are more short of breath than usual. °· You cannot do your normal activities. °· You tire easily. °· You cough more than normal, especially with activity. °· You have any or more puffiness (swelling) in areas such as your hands, feet, ankles, or belly (abdomen). °· You cannot sleep because it is hard to breathe. °· You feel like your heart is beating fast (palpitations). °· You get dizzy or light-headed when you stand up. °Get help right away if: °· You have trouble breathing. °· There is a change in mental status, such as becoming less alert or not being able to focus. °· You have chest pain or discomfort. °· You faint. °This information is not intended to replace advice given to you by your health care provider. Make sure you   discuss any questions you have with your health care provider. Document Released: 01/01/2008 Document Revised: 08/30/2015 Document Reviewed: 05/10/2012 Elsevier Interactive Patient Education  2017 Elsevier Inc.   Low-Sodium Eating Plan Sodium, which is an element that makes up salt, helps you maintain a healthy balance of fluids in your body. Too much sodium can increase your blood pressure and cause fluid and waste to be held in your body. Your health care provider or dietitian may recommend following this plan if you have  high blood pressure (hypertension), kidney disease, liver disease, or heart failure. Eating less sodium can help lower your blood pressure, reduce swelling, and protect your heart, liver, and kidneys. What are tips for following this plan? General guidelines  Most people on this plan should limit their sodium intake to 1,500-2,000 mg (milligrams) of sodium each day. Reading food labels  The Nutrition Facts label lists the amount of sodium in one serving of the food. If you eat more than one serving, you must multiply the listed amount of sodium by the number of servings.  Choose foods with less than 140 mg of sodium per serving.  Avoid foods with 300 mg of sodium or more per serving. Shopping  Look for lower-sodium products, often labeled as "low-sodium" or "no salt added."  Always check the sodium content even if foods are labeled as "unsalted" or "no salt added".  Buy fresh foods. ? Avoid canned foods and premade or frozen meals. ? Avoid canned, cured, or processed meats  Buy breads that have less than 80 mg of sodium per slice. Cooking  Eat more home-cooked food and less restaurant, buffet, and fast food.  Avoid adding salt when cooking. Use salt-free seasonings or herbs instead of table salt or sea salt. Check with your health care provider or pharmacist before using salt substitutes.  Cook with plant-based oils, such as canola, sunflower, or olive oil. Meal planning  When eating at a restaurant, ask that your food be prepared with less salt or no salt, if possible.  Avoid foods that contain MSG (monosodium glutamate). MSG is sometimes added to Chinese food, bouillon, and some canned foods. What foods are recommended? The items listed may not be a complete list. Talk with your dietitian about what dietary choices are best for you. Grains Low-sodium cereals, including oats, puffed wheat and rice, and shredded wheat. Low-sodium crackers. Unsalted rice. Unsalted pasta.  Low-sodium bread. Whole-grain breads and whole-grain pasta. Vegetables Fresh or frozen vegetables. "No salt added" canned vegetables. "No salt added" tomato sauce and paste. Low-sodium or reduced-sodium tomato and vegetable juice. Fruits Fresh, frozen, or canned fruit. Fruit juice. Meats and other protein foods Fresh or frozen (no salt added) meat, poultry, seafood, and fish. Low-sodium canned tuna and salmon. Unsalted nuts. Dried peas, beans, and lentils without added salt. Unsalted canned beans. Eggs. Unsalted nut butters. Dairy Milk. Soy milk. Cheese that is naturally low in sodium, such as ricotta cheese, fresh mozzarella, or Swiss cheese Low-sodium or reduced-sodium cheese. Cream cheese. Yogurt. Fats and oils Unsalted butter. Unsalted margarine with no trans fat. Vegetable oils such as canola or olive oils. Seasonings and other foods Fresh and dried herbs and spices. Salt-free seasonings. Low-sodium mustard and ketchup. Sodium-free salad dressing. Sodium-free light mayonnaise. Fresh or refrigerated horseradish. Lemon juice. Vinegar. Homemade, reduced-sodium, or low-sodium soups. Unsalted popcorn and pretzels. Low-salt or salt-free chips. What foods are not recommended? The items listed may not be a complete list. Talk with your dietitian about what dietary choices are best   for you. Grains Instant hot cereals. Bread stuffing, pancake, and biscuit mixes. Croutons. Seasoned rice or pasta mixes. Noodle soup cups. Boxed or frozen macaroni and cheese. Regular salted crackers. Self-rising flour. Vegetables Sauerkraut, pickled vegetables, and relishes. Olives. French fries. Onion rings. Regular canned vegetables (not low-sodium or reduced-sodium). Regular canned tomato sauce and paste (not low-sodium or reduced-sodium). Regular tomato and vegetable juice (not low-sodium or reduced-sodium). Frozen vegetables in sauces. Meats and other protein foods Meat or fish that is salted, canned, smoked, spiced,  or pickled. Bacon, ham, sausage, hotdogs, corned beef, chipped beef, packaged lunch meats, salt pork, jerky, pickled herring, anchovies, regular canned tuna, sardines, salted nuts. Dairy Processed cheese and cheese spreads. Cheese curds. Blue cheese. Feta cheese. String cheese. Regular cottage cheese. Buttermilk. Canned milk. Fats and oils Salted butter. Regular margarine. Ghee. Bacon fat. Seasonings and other foods Onion salt, garlic salt, seasoned salt, table salt, and sea salt. Canned and packaged gravies. Worcestershire sauce. Tartar sauce. Barbecue sauce. Teriyaki sauce. Soy sauce, including reduced-sodium. Steak sauce. Fish sauce. Oyster sauce. Cocktail sauce. Horseradish that you find on the shelf. Regular ketchup and mustard. Meat flavorings and tenderizers. Bouillon cubes. Hot sauce and Tabasco sauce. Premade or packaged marinades. Premade or packaged taco seasonings. Relishes. Regular salad dressings. Salsa. Potato and tortilla chips. Corn chips and puffs. Salted popcorn and pretzels. Canned or dried soups. Pizza. Frozen entrees and pot pies. Summary  Eating less sodium can help lower your blood pressure, reduce swelling, and protect your heart, liver, and kidneys.  Most people on this plan should limit their sodium intake to 1,500-2,000 mg (milligrams) of sodium each day.  Canned, boxed, and frozen foods are high in sodium. Restaurant foods, fast foods, and pizza are also very high in sodium. You also get sodium by adding salt to food.  Try to cook at home, eat more fresh fruits and vegetables, and eat less fast food, canned, processed, or prepared foods. This information is not intended to replace advice given to you by your health care provider. Make sure you discuss any questions you have with your health care provider. Document Released: 09/13/2001 Document Revised: 03/17/2016 Document Reviewed: 03/17/2016 Elsevier Interactive Patient Education  2018 Elsevier Inc.  

## 2017-05-08 NOTE — Progress Notes (Signed)
Hospital follow up  Assessment and Plan: Hospital visit follow up for   Congestive heart failure, unspecified HF chronicity, unspecified heart failure type ([place]) -     CBC with Differential/Platelet -     BASIC METABOLIC PANEL WITH GFR -     Hepatic function panel - continue to cut back sodium/sugar - monitor weight daily and bring logs to appointments - no more than 64 oz of water a day or less - has OV follow up with Cecille Rubin next week  Acute on chronic diastolic heart failure ([place]) Resolved, weight is down Did strict weight precautions with patient Continue lasix BID for now will check kidney function  Cardiogenic pulmonary edema ([place]) Has resolved with diuretics, lungs CTAB  Acute renal failure superimposed on stage 4 chronic kidney disease, unspecified acute renal failure type ([place]) -     BASIC METABOLIC PANEL WITH GFR - discussed in length with patient that we are doing a balance with heart and kidney - check Mag and kidney function.   Acute non-recurrent maxillary sinusitis -     doxycycline (VIBRAMYCIN) 100 MG capsule; Take 1 capsule twice daily with food  Mixed hyperlipidemia -     Lipid panel - started new medication, doing well, check LFTs, chol.   Medication management -     Magnesium  Hospital discharge meds were reviewed, and reconciled with the patient.    There are no discontinued medications.   Over 40 minutes of exam, counseling, chart review, and complex, high/moderate level critical decision making was performed this visit.  Future Appointments  Date Time Provider [place]  05/15/2017  1:15 PM [name], [place] [facility]  05/18/2017 11:00 AM Burtis Junes, NP [facility]  08/04/2017  3:00 PM Unk Pinto, MD [facility] None  02/10/2018 11:00 AM [facility] None  02/10/2018 11:30 AM Magrinat, Virgie Dad, MD [facility] None     HPI 72 y.o.female presents for follow up for transition from  recent hospitalization. Admit date to the hospital was 05/03/17, patient was discharged from the hospital on 05/07/17 and our clinical staff contacted the office the day after discharge to set up a follow up appointment. The discharge summary, medications, and diagnostic test results were reviewed before meeting with the patient. The patient was admitted for:  Acute on chronic diastolic heart failure, cardiogenic pulmonary edema, and acute on chronic stage 4 kidney disease.   She presented to the hospital with SOB, orthopnea, PND and weight gain, found to have pulmonary congestion, elevated BNP and hypoxia. She tolerated dieresis well and was discharged home.  Her HCTZ was discontinued and she was started on lasix 40 mg daily with strict weight instructions. She called over the weekend with 1-2 lb weight gain and weight swelling, she has increased her lasix to 3 a day for the weekend and states this has helped. She just did the 2 a day this weekend. She is weighing daily and brought in her log. Her weight is down. She is drinking 64 oz of water a day or less. SHE IS NOT ON POTASSIUM PILLS.  Wt Readings from Last 3 Encounters:  05/11/17 171 lb 9.6 oz (77.8 kg)  05/07/17 178 lb 6.4 oz (80.9 kg)  04/28/17 181 lb (82.1 kg)   Her Coreg (now 25mg  BId) and hydralazine (75mg  TID), isordil (10 mg TID) was added and increased in the hospital to optimize HF medications. [facility] IS ORDERING ISORDIL has not taken today.  She had an echo on  04/13/2017 showing a decrease in her EF to 45-50%, she has a follow up with cardiology. Her BP today is BP: 128/66  Her crestor was changed to lipitor due to CKD restrictions, she had acute on chronic heart failure in the hospital. We will recheck that today, she does have a follow up with nephrology.    Lab Results  Component Value Date   GFRNONAA 12 (L) 05/07/2017   Lab Results  Component Value Date   CREATININE 3.44 (H) 05/07/2017   BUN 76 (H) 05/07/2017   NA 139  05/07/2017   K 3.7 05/07/2017   CL 106 05/07/2017   CO2 22 05/07/2017   Since her discharge she has had sinus pressure, sneezing since in the hospital, has gotten worse x Thursday or Friday. Has been 1-2 weeks. She has had chills but no fever, she has productive cough.   Home health is not involved.   Images while in the hospital: Dg Chest 2 View  Result Date: 05/03/2017 CLINICAL DATA:  Patient with shortness of breath and chest tightness. EXAM: CHEST  2 VIEW COMPARISON:  Chest radiograph 04/12/2013 FINDINGS: Monitoring leads overlie the patient. Stable cardiomegaly status post median sternotomy. Aortic atherosclerosis. Bilateral interstitial pulmonary opacities. Small bilateral pleural effusions. Thoracic spine degenerative changes. IMPRESSION: Cardiomegaly with interstitial opacities bilaterally favored to represent edema. Atypical infection could appear similar. Electronically Signed   By: Lovey Newcomer M.D.   On: 05/03/2017 20:50    Past Medical History:  Diagnosis Date  . Allergy   . Arthritis    "maybe in my lower back" (08/27/2012)  . 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 Chronic combined systolic and diastolic CHF (congestive heart failure) ([place])   . CKD (chronic kidney disease), stage IV ([place])   . Coronary artery disease    a. NSTEMI in 05/2009 s/p CABG (LIMA-LAD, SVG-diagonal, SVG-OM1/OM 2).   . Family history of anesthesia complication    Mom has a hard time to wake up  . Foot ulcer ([place])    "I've had them on both feet" (08/27/2012)  . Gouty arthritis    "right index finger" (08/27/2012)  . History of radiation therapy 06/09/13-07/06/13   left breast 50Gy  . Hyperlipidemia   . Hypertension   . Infection    right second toe  . Ischemic cardiomyopathy    a. 2011: EF 40-45%. b. EF 55-60% in 04/2017 but shortly after as inpatient was 45-50%.  Marland Kitchen LBBB (left bundle branch block)   . Mitral regurgitation   . Neuropathy    Hx; of  B/L feet  . NSTEMI (non-ST elevated myocardial infarction) ([place]) 05/23/2009  . Osteomyelitis of foot ([place])   . PAD (peripheral artery disease) ([facility])    a. LE PAD (patient previously elected hold off L fem-pop), prev followed by Dr. Bridgett Larsson  . PONV (postoperative nausea and vomiting)   . Sinus headache    "occasionally" (08/27/2012)  . Type II diabetes mellitus ([place])   . Vitamin D deficiency      Allergies  Allergen Reactions  . Atenolol Other (See Comments)    Exacerbates gout  . Contrast Media [Iodinated Diagnostic Agents] Rash  . Latex Rash  . Omnipaque [Iohexol] Rash      Current Outpatient Medications on File Prior to Visit  Medication Sig Dispense Refill  . allopurinol (ZYLOPRIM) 100 MG tablet TAKE 1 TABLET TWICE DAILY (Patient taking differently: Take 100 mg by mouth two times a day) 180 tablet  1  . anastrozole (ARIMIDEX) 1 MG tablet Take 1 tablet (1 mg total) by mouth daily. 90 tablet 3  . aspirin 325 MG tablet Take 325 mg by mouth at bedtime.     Marland Kitchen aspirin-acetaminophen-caffeine (EXCEDRIN MIGRAINE) 250-250-65 MG tablet Take 1 tablet by mouth daily as needed (for sinus headaches.).    Marland Kitchen atorvastatin (LIPITOR) 80 MG tablet Take 1 tablet (80 mg total) by mouth daily at 6 PM. 30 tablet 0  . beta carotene w/minerals (OCUVITE) tablet Take 1 tablet by mouth daily.    . carvedilol (COREG) 25 MG tablet Take 1 tablet (25 mg total) by mouth 2 (two) times daily with a meal. 60 tablet 0  . Cholecalciferol (VITAMIN D3) 2000 units TABS Take 2,000 Units by mouth daily.     Marland Kitchen docusate sodium (COLACE) 100 MG capsule Take 100 mg by mouth at bedtime.    . fluticasone (FLONASE) 50 MCG/ACT nasal spray Place 2 sprays into both nostrils daily as needed for allergies or rhinitis.    . furosemide (LASIX) 40 MG tablet Take 1 tablet (40 mg total) by mouth daily. 30 tablet 0  . hydrALAZINE (APRESOLINE) 25 MG tablet Take 3 tablets (75 mg total) by mouth every 8 (eight) hours. 270 tablet 0  . insulin  NPH-regular Human (NOVOLIN 70/30) (70-30) 100 UNIT/ML injection Inject 30-65 Units into the skin See admin instructions. 60-65 units into the skin in the morning before breakfast & 30-35 units in the evening, depending on BGL    . isosorbide dinitrate (ISORDIL) 10 MG tablet Take 1 tablet (10 mg total) by mouth 3 (three) times daily. 90 tablet 0  . NON FORMULARY [employer] Tonic and Homeopathic Remedy: Mix 1 ounce with water and drink one to two times a day (5-6 times a week)    . Polyethyl Glycol-Propyl Glycol (LUBRICANT EYE DROPS) 0.4-0.3 % SOLN Place 1-2 drops into both eyes 3 (three) times daily as needed (for dry eyes.).     No current facility-administered medications on file prior to visit.     ROS: all negative except above.   Physical Exam: Filed Weights   05/11/17 0955  Weight: 171 lb 9.6 oz (77.8 kg)   BP 128/66   Pulse 78   Temp 97.7 F (36.5 C)   Resp 16   Ht 5\' 5"  (1.651 m)   Wt 171 lb 9.6 oz (77.8 kg)   SpO2 96%   BMI 28.56 kg/m  General appearance: alert, no distress, WD/WN,  female HEENT: normocephalic, sclerae anicteric, TMs pearly, nares patent, + erythema pharynx, + maxillary tenderness bilaterally.  Oral cavity: MMM, no lesions Neck: supple, no lymphadenopathy, no thyromegaly, no masses Heart: RRR, normal S1, S2, no murmurs Lungs: CTA bilaterally, no wheezes, rhonchi, or rales Abdomen: +bs, soft, non tender, non distended, no masses, no hepatomegaly, no splenomegaly Musculoskeletal: nontender, no swelling, no obvious deformity Extremities: + 1 edema bilateral legs, no cyanosis, no clubbing Pulses: 2+ symmetric, upper and lower extremities, normal cap refill Neurological: alert, oriented x 3, CN2-12 intact, strength normal upper extremities and lower extremities, DTRs 2+ throughout, no cerebellar signs, gait normal Psychiatric: normal affect, behavior normal, pleasant     Vicie Mutters, PA-C 10:11 AM [facility]

## 2017-05-08 NOTE — Telephone Encounter (Signed)
Patient contacted regarding discharge from [facility] on 05/07/17  Patient understands to follow up with provider Truitt Merle NPP on 05-18-17 at 11:00 AM at [facility]. Patient understands discharge instructions? {yes  Patient understands medications and regiment? {yes   Patient understands to bring all medications to this visit? {yes   NO OTHER QUESTIONS OR CONCERNS  FROM PT AT THIS TIME IS DOING WELL ./CY

## 2017-05-09 ENCOUNTER — Other Ambulatory Visit: Payer: Self-pay | Admitting: Internal Medicine

## 2017-05-09 NOTE — Progress Notes (Signed)
  05/09/2017 Sat 2 pm     72 yo MWF with HTN, ASHD, T2_DM and CKD4 - hospitalized 1/27-31 with CHF and switched HCTZ to Lasix - home 36 hrs and reports wt up 1-2 # with mild Dyspnea. Reports no pronounced diuresis after lasix 40 mg     Recc increase to Lasix 40 mg x 2 tab = 80 mg now and qam and call back if not a significant diuresis . Also monitor weights closely.

## 2017-05-11 ENCOUNTER — Ambulatory Visit (INDEPENDENT_AMBULATORY_CARE_PROVIDER_SITE_OTHER): Payer: Medicare Other | Admitting: Physician Assistant

## 2017-05-11 ENCOUNTER — Encounter: Payer: Self-pay | Admitting: Physician Assistant

## 2017-05-11 VITALS — BP 128/66 | HR 78 | Temp 97.7°F | Resp 16 | Ht 65.0 in | Wt 171.6 lb

## 2017-05-11 DIAGNOSIS — I501 Left ventricular failure: Secondary | ICD-10-CM | POA: Diagnosis not present

## 2017-05-11 DIAGNOSIS — J0100 Acute maxillary sinusitis, unspecified: Secondary | ICD-10-CM | POA: Diagnosis not present

## 2017-05-11 DIAGNOSIS — Z79899 Other long term (current) drug therapy: Secondary | ICD-10-CM

## 2017-05-11 DIAGNOSIS — I5033 Acute on chronic diastolic (congestive) heart failure: Secondary | ICD-10-CM | POA: Diagnosis not present

## 2017-05-11 DIAGNOSIS — E782 Mixed hyperlipidemia: Secondary | ICD-10-CM | POA: Diagnosis not present

## 2017-05-11 DIAGNOSIS — I509 Heart failure, unspecified: Secondary | ICD-10-CM | POA: Diagnosis not present

## 2017-05-11 DIAGNOSIS — N184 Chronic kidney disease, stage 4 (severe): Secondary | ICD-10-CM | POA: Diagnosis not present

## 2017-05-11 DIAGNOSIS — N179 Acute kidney failure, unspecified: Secondary | ICD-10-CM

## 2017-05-11 MED ORDER — DOXYCYCLINE HYCLATE 100 MG PO CAPS: ORAL_CAPSULE | ORAL | 0 refills | Status: DC

## 2017-05-11 NOTE — Patient Instructions (Addendum)
Stay on lasix 2 pills a day If your weight goes up 2 lbs or see below with the symptoms can take 3 a day for 2-3 days   Do the following things EVERYDAY: 1) Weigh yourself in the morning before breakfast or at the same time every day. Write it down and keep it in a log. 2) Take your medicines as prescribed 3) Eat low salt foods-Limit salt (sodium) to 2000 mg per day. Best thing to do is avoid processed foods.   4) Stay as active as you can everyday 5) Limit all fluids for the day to less than 1.5 liters or 64 oz or less.   Call your doctor if:  Anytime you have any of the following symptoms:  1) 2 pound weight gain in 24 hours or 5 pounds in 1 week  2) shortness of breath, with or without a dry hacking cough  3) swelling in the hands, LEGs, feet or stomach  4) if you have to sleep on extra pillows at night in order to breathe. 5) after laying down at night for 20-30 mins, you wake up short of breath.   These can all be signs of fluid overload.    Heart Failure Heart failure means your heart has trouble pumping blood. This makes it hard for your body to work well. Heart failure is usually a long-term (chronic) condition. You must take good care of yourself and follow your doctor's treatment plan. Follow these instructions at home:  Take your heart medicine as told by your doctor. ? Do not stop taking medicine unless your doctor tells you to. ? Do not skip any dose of medicine. ? Refill your medicines before they run out. ? Take other medicines only as told by your doctor or pharmacist.  Stay active if told by your doctor. The elderly and people with severe heart failure should talk with a doctor about physical activity.  Eat heart-healthy foods. Choose foods that are without trans fat and are low in saturated fat, cholesterol, and salt (sodium). This includes fresh or frozen fruits and vegetables, fish, lean meats, fat-free or low-fat dairy foods, whole grains, and high-fiber foods.  Lentils and dried peas and beans (legumes) are also good choices.  Limit salt if told by your doctor.  Cook in a healthy way. Roast, grill, broil, bake, poach, steam, or stir-fry foods.  Limit fluids as told by your doctor.  Weigh yourself every morning. Do this after you pee (urinate) and before you eat breakfast. Write down your weight to give to your doctor.  Take your blood pressure and write it down if your doctor tells you to.  Ask your doctor how to check your pulse. Check your pulse as told.  Lose weight if told by your doctor.  Stop smoking or chewing tobacco. Do not use gum or patches that help you quit without your doctor's approval.  Schedule and go to doctor visits as told.  Nonpregnant women should have no more than 1 drink a day. Men should have no more than 2 drinks a day. Talk to your doctor about drinking alcohol.  Stop illegal drug use.  Stay current with shots (immunizations).  Manage your health conditions as told by your doctor.  Learn to manage your stress.  Rest when you are tired.  If it is really hot outside: ? Avoid intense activities. ? Use air conditioning or fans, or get in a cooler place. ? Avoid caffeine and alcohol. ? Wear loose-fitting, lightweight, and  light-colored clothing.  If it is really cold outside: ? Avoid intense activities. ? Layer your clothing. ? Wear mittens or gloves, a hat, and a scarf when going outside. ? Avoid alcohol.  Learn about heart failure and get support as needed.  Get help to maintain or improve your quality of life and your ability to care for yourself as needed. Contact a doctor if:  You gain weight quickly.  You are more short of breath than usual.  You cannot do your normal activities.  You tire easily.  You cough more than normal, especially with activity.  You have any or more puffiness (swelling) in areas such as your hands, feet, ankles, or belly (abdomen).  You cannot sleep because it  is hard to breathe.  You feel like your heart is beating fast (palpitations).  You get dizzy or light-headed when you stand up. Get help right away if:  You have trouble breathing.  There is a change in mental status, such as becoming less alert or not being able to focus.  You have chest pain or discomfort.  You faint. This information is not intended to replace advice given to you by your health care provider. Make sure you discuss any questions you have with your health care provider. Document Released: 01/01/2008 Document Revised: 08/30/2015 Document Reviewed: 05/10/2012 Elsevier Interactive Patient Education  2017 [place].   Please pick one of the over the counter allergy medications below and take it once daily for allergies.  Claritin or loratadine cheapest but likely the weakest  Zyrtec or certizine at night because it can make you sleepy The strongest is allegra or fexafinadine  Cheapest at [employer], [employer], [employer]  HOW TO TREAT VIRAL COUGH AND COLD SYMPTOMS:  -Symptoms usually last at least 1 week with the worst symptoms being around day 4.  - colds usually start with a sore throat and end with a cough, and the cough can take 2 weeks to get better.  -No antibiotics are needed for colds, flu, sore throats, cough, bronchitis UNLESS symptoms are longer than 7 days OR if you are getting better then get drastically worse.  -There are a lot of combination medications (Dayquil, Nyquil, Vicks 44, tyelnol cold and sinus, ETC). Please look at the ingredients on the back so that you are treating the correct symptoms and not doubling up on medications/ingredients.    Medicines you can use  Nasal congestion  Little Remedies saline spray (aerosol/mist)- can try this, it is in the kids section - pseudoephedrine (Sudafed)- behind the counter, do not use if you have high blood pressure, medicine that have -D in them.  - phenylephrine (Sudafed PE) -Dextormethorphan +  chlorpheniramine (Coridcidin HBP)- okay if you have high blood pressure -Oxymetazoline (Afrin) nasal spray- LIMIT to 3 days -Saline nasal spray -Neti pot (used distilled or bottled water)  Ear pain/congestion  -pseudoephedrine (sudafed) - Nasonex/flonase nasal spray  Fever  -Acetaminophen (Tyelnol) -Ibuprofen (Advil, motrin, aleve)  Sore Throat  -Acetaminophen (Tyelnol) -Ibuprofen (Advil, motrin, aleve) -Drink a lot of water -Gargle with salt water - Rest your voice (don't talk) -Throat sprays -Cough drops  Body Aches  -Acetaminophen (Tyelnol) -Ibuprofen (Advil, motrin, aleve)  Headache  -Acetaminophen (Tyelnol) -Ibuprofen (Advil, motrin, aleve) - Exedrin, Exedrin Migraine  Allergy symptoms (cough, sneeze, runny nose, itchy eyes) -Claritin or loratadine cheapest but likely the weakest  -Zyrtec or certizine at night because it can make you sleepy -The strongest is allegra or fexafinadine  Cheapest at [employer], [employer], [employer]  Cough  -Dextromethorphan (Delsym)- medicine that has DM in it -Guafenesin (Mucinex/Robitussin) - cough drops - drink lots of water  Chest Congestion  -Guafenesin (Mucinex/Robitussin)  Red Itchy Eyes  - Naphcon-A  Upset Stomach  - Bland diet (nothing spicy, greasy, fried, and high acid foods like tomatoes, oranges, berries) -OKAY- cereal, bread, soup, crackers, rice -Eat smaller more frequent meals -reduce caffeine, no alcohol -Loperamide (Imodium-AD) if diarrhea -Prevacid for heart burn  General health when sick  -Hydration -wash your hands frequently -keep surfaces clean -change pillow cases and sheets often -Get fresh air but do not exercise strenuously -Vitamin D, double up on it - Vitamin C -Zinc

## 2017-05-12 ENCOUNTER — Other Ambulatory Visit: Payer: Self-pay | Admitting: *Deleted

## 2017-05-12 LAB — CBC WITH DIFFERENTIAL/PLATELET
Basophils Absolute: 7 cells/uL (ref 0–200)
Basophils Relative: 0.1 %
Eosinophils Absolute: 122 cells/uL (ref 15–500)
Eosinophils Relative: 1.7 %
HCT: 32.2 % — ABNORMAL LOW (ref 35.0–45.0)
HEMOGLOBIN: 10.8 g/dL — AB (ref 11.7–15.5)
Lymphs Abs: 1066 cells/uL (ref 850–3900)
MCH: 29.8 pg (ref 27.0–33.0)
MCHC: 33.5 g/dL (ref 32.0–36.0)
MCV: 88.7 fL (ref 80.0–100.0)
MONOS PCT: 14.1 %
MPV: 11.7 fL (ref 7.5–12.5)
NEUTROS ABS: 4990 {cells}/uL (ref 1500–7800)
Neutrophils Relative %: 69.3 %
Platelets: 198 10*3/uL (ref 140–400)
RBC: 3.63 10*6/uL — ABNORMAL LOW (ref 3.80–5.10)
RDW: 14.5 % (ref 11.0–15.0)
Total Lymphocyte: 14.8 %
WBC mixed population: 1015 cells/uL — ABNORMAL HIGH (ref 200–950)
WBC: 7.2 10*3/uL (ref 3.8–10.8)

## 2017-05-12 LAB — HEPATIC FUNCTION PANEL
AG Ratio: 1.6 (calc) (ref 1.0–2.5)
ALKALINE PHOSPHATASE (APISO): 76 U/L (ref 33–130)
ALT: 27 U/L (ref 6–29)
AST: 26 U/L (ref 10–35)
Albumin: 3.6 g/dL (ref 3.6–5.1)
BILIRUBIN INDIRECT: 0.2 mg/dL (ref 0.2–1.2)
Bilirubin, Direct: 0.1 mg/dL (ref 0.0–0.2)
Globulin: 2.3 g/dL (calc) (ref 1.9–3.7)
TOTAL PROTEIN: 5.9 g/dL — AB (ref 6.1–8.1)
Total Bilirubin: 0.3 mg/dL (ref 0.2–1.2)

## 2017-05-12 LAB — BASIC METABOLIC PANEL WITH GFR
BUN / CREAT RATIO: 24 (calc) — AB (ref 6–22)
BUN: 89 mg/dL — ABNORMAL HIGH (ref 7–25)
CHLORIDE: 102 mmol/L (ref 98–110)
CO2: 28 mmol/L (ref 20–32)
Calcium: 9.3 mg/dL (ref 8.6–10.4)
Creat: 3.71 mg/dL — ABNORMAL HIGH (ref 0.60–0.93)
GFR, Est African American: 13 mL/min/{1.73_m2} — ABNORMAL LOW (ref >=60)
GFR, Est Non African American: 12 mL/min/{1.73_m2} — ABNORMAL LOW (ref >=60)
GLUCOSE: 88 mg/dL (ref 65–99)
POTASSIUM: 4.3 mmol/L (ref 3.5–5.3)
SODIUM: 141 mmol/L (ref 135–146)

## 2017-05-12 LAB — LIPID PANEL
Cholesterol: 120 mg/dL (ref <200)
HDL: 33 mg/dL — AB (ref >50)
LDL Cholesterol (Calc): 65 mg/dL (calc)
NON-HDL CHOLESTEROL (CALC): 87 mg/dL (ref <130)
Total CHOL/HDL Ratio: 3.6 (calc) (ref <5.0)
Triglycerides: 137 mg/dL (ref <150)

## 2017-05-12 LAB — MAGNESIUM: Magnesium: 2.1 mg/dL (ref 1.5–2.5)

## 2017-05-12 NOTE — Patient Outreach (Signed)
[facility]) Care Management  05/12/2017  [name] [facility] Jun 27, 1945 [id number]  EMMI- Heart Failure RED ON EMMI ALERT DAY#:  DATE: 05/11/17 RED ALERT: New or worsening problem? Yes Given new prescriptions to fill? yes   Outreach attempt # 1 to patient and HIPAA verified. Patient confirmed having new or worsening problems. Patient stated, she had a follow-up discharge hospital follow-up visit at her primary MD's office on 05/11/17. During the visit, she spoke with the Physician Assistant about having sinus congestion. Patient stated, she was diagnosed with Sinusitis and prescribed Doxycycline. Patient reported, she doesn't believe her Sinusitis has improved, however she has only taken two doses of Doxycycline. She plans to take the antibiotics a few more days and if no improvement; she will contact the MD. Patient explained due to her nasal congestion; she has become a mouth breather. Patient discussed using sea salt sprays and water flushes to assist with decreasing her congestion. Patient stated, the hospital was using the same regimen. Patient reported, she has minimized her salt intake related to her fluid overload. Per patient, she hasn't been diagnosed with Heart Failure. She verbalized being hospitalized for shortness of breath (SOB) and having to slowly remove fluid from her lungs. Patient discussed monitoring her fluid intake daily. She has a 32 oz tumbler, which she fills completely twice per day. Patient stated, she drinks an estimated 34 oz per day from the tumbler. RN CM and patient discussed other alternatives to assist with dry mouth, such as biotene and hard sugar-free candy.   Patient stated, she has a history of Diabetes. She eats several small meals throughout the day that are regulated with an alarm. She takes medications during those meal times. She voiced concerned about the medication changes made while in the hospital. She wants to make sure her medications are  taken appropriately. Patient monitors her B/P daily and her last reading was 128/67 and pulse 70. She has a scheduled appointment with Cardiology on 05/18/17.    Plan: RN CM will contact patient within one week.  RN CM advised patient to contact RNCM for any needs or concerns. RN CM advised patient to alert MD for any changes in conditions.  RM CM will send successful outreach letter to patient. RN CM will send [facility] pharmacy referral for medication management. RN CM will send patient EMMI educational materials.    Lake Bells, RN, BSN, MHA/MSL, [place] Telephonic Care Manager Coordinator [facility] Direct Phone: [phone] Cell Phone: [phone] Toll Free: [phone] Fax: [phone]

## 2017-05-13 ENCOUNTER — Encounter: Payer: Self-pay | Admitting: *Deleted

## 2017-05-15 ENCOUNTER — Encounter: Payer: Medicare Other | Admitting: Podiatry

## 2017-05-18 ENCOUNTER — Ambulatory Visit (INDEPENDENT_AMBULATORY_CARE_PROVIDER_SITE_OTHER): Payer: Medicare Other | Admitting: Nurse Practitioner

## 2017-05-18 ENCOUNTER — Encounter: Payer: Self-pay | Admitting: Nurse Practitioner

## 2017-05-18 ENCOUNTER — Telehealth: Payer: Self-pay | Admitting: Pharmacist

## 2017-05-18 VITALS — BP 160/76 | HR 69 | Ht 65.0 in | Wt 174.0 lb

## 2017-05-18 DIAGNOSIS — I5022 Chronic systolic (congestive) heart failure: Secondary | ICD-10-CM | POA: Diagnosis not present

## 2017-05-18 DIAGNOSIS — I2510 Atherosclerotic heart disease of native coronary artery without angina pectoris: Secondary | ICD-10-CM | POA: Diagnosis not present

## 2017-05-18 MED ORDER — FUROSEMIDE 40 MG PO TABS: 40.0000 mg | ORAL_TABLET | Freq: Every day | ORAL | 3 refills | Status: DC

## 2017-05-18 MED ORDER — ASPIRIN EC 81 MG PO TBEC: 81.0000 mg | DELAYED_RELEASE_TABLET | Freq: Every day | ORAL | Status: DC

## 2017-05-18 NOTE — Telephone Encounter (Signed)
-----   Message from Jiles Harold sent at 05/14/2017 10:10 AM EST ----- Regarding: Referral: Order for [facility] Code  Referral from Lake Bells, RN  "Please see below request"  Forde Radon ----- Message ----- From: Gabriel Cirri, RN Sent: 05/13/2017   7:33 PM To: Thn Cm Communication Orders Subject: Order for [facility]                       Patient Name: [name], [name] N([id number]) Sex: Female DOB: [birth year]    PCP: Unk Pinto   Center: [facility]   Types of orders made on 05/13/2017: Nursing  Order Date:05/13/2017 Ordering User:FUTRELL, Ree Kida [[id number]] Encounter Provider:Futrell, Ree Kida, RN [phone] Authorizing Provide r: Unk Pinto, MD [phone] Department:[facility][[id number]]  Order Specific Information Order: Comm to Pharmacy [Custom: [id number]]  Order #: [id number] Qty: 1   Priority: Routine  Class: Clinic Performed   Comment:Referral from: Red EMMI Heart Failure            Medication management-patient having trouble managing meds                        Lake Bells, RN, BSN, MHA/MSL, [place] Telephonic Care Manager Coordinator            [facility]            Direct Phone: [phone]            Cell Phone: [phone]            Toll Free: [phone]            Fax: [phone]     Priority: Routine  Class: Clinic Performed   Comment:Referral from: Red EMMI Heart Failure            Medication management-patient having trouble managing meds                        Concha Se trell, RN, BSN, MHA/MSL, [place] Telephonic Care Manager Coordinator            [facility]            Direct Phone: [phone]            Cell Phone: [phone]            Toll Free: [phone]            Fax: [phone]

## 2017-05-18 NOTE — Patient Instructions (Addendum)
We will be checking the following labs today - BMET & CBC   Medication Instructions:    Continue with your current medicines. BUT  I am cutting the Lasix back to just 40 mg a day - I have sent this RX to your mail order.   Cut the aspirin back to just 81 mg a day    Testing/Procedures To Be Arranged:  Lexiscan Myoview  Follow-Up:   See Dr. Johnsie Cancel in one month    Other Special Instructions:   Keep weighing daily    If you need a refill on your cardiac medications before your next appointment, please call your pharmacy.   Call the [place] office at [phone] if you have any questions, problems or concerns.

## 2017-05-18 NOTE — Progress Notes (Signed)
CARDIOLOGY OFFICE NOTE  Date:  05/18/2017    [name] Date of Birth: 02/28/1946 Medical Record #[id number]  PCP:  Unk Pinto, MD  Cardiologist:  Johnsie Cancel  Chief Complaint  Patient presents with  . Congestive Heart Failure    TOC visit - seen for Dr. Johnsie Cancel.     History of Present Illness: AMIAYA [name] is a 72 y.o. female who presents today for a post hospital/TOC visit. She has however already been seen by PCP.  Seen here for Dr. Johnsie Cancel (last seen in December of 2014 by Dr. Johnsie Cancel).   She has a hx of CAD (NSTEMI 2011 s/p CABG per Dr. Cyndia Bent), chronic combined CHF/ICM, CKD stage IV ((baseline Cr around 2.7-3.1), HTN, HLD, DM, LE PAD (patient previously elected hold off L fem-pop), foot ulcer/infection, osteomyelitis of the left toe, LBBB, arthritis, breast CA s/p lumpectomy/radiation, & gout.   At time of MI/CABG in 2011, EF was 40-45%. In September she reports her nephrologist Dr. Hollie Salk discontinued her losartan and changed her to hydralazine due to declining renal function. She saw Rosaria Ferries 02/2018 for pre-op eval for toe amputation at which time she was reporting generally decreased stamina but no specific symptoms. 2D echo 1/7//19 showed EF 55-60%.   Admitted at the end of January with worsening SOB/DOE as well as edema of her lower extremities. Repeat echo 05/04/17 showed mild LVH, EF 45-50%, diffuse HK, grade 2 DD, calcified mitral annulus with moderate MR, mildly dilated LA, normal RV. Initial labs notable for Cr 2.9, troponin 0.07, BNP 784, leukocytosis WBC 12k, Hgb 11.9. Troponins low/flat, peak 0.08. CXR shows cardiomegaly with interstitial opacities bilaterally favored to represent edema; atypical infection could appear similar. She was diuresed with IV Lasix. Although weight only changed by 1 lb, she reported brisk UOP and was down 4L. Hydralazine and carvedilol were increased and isordil was added. With these measure she reports marked improvement. She did  admit to laxity with her diet/sodium restriction over the holidays.   Comes in today. Here alone. Developed an "explosive" head cold immediately after discharge. No fever or chills. No real cough. She noted some swelling at her visit with PCP right after discharge - Lasix was increased to 80 mg a day. She now "feels dry". Breathing is "fine". No real issues with her breathing at all. Her weight continues to decline at home. No further swelling noted.  Her BP readings from home look great - typically runs higher here (white coat). She is doing better about restricting her salt and admits that she had gotten pretty lax with her diet previously. She has had no chest pain. She is limited by her feet and knees for exercise. She is not dizzy or lightheaded. She is seeing Renal on Wednesday. Lab from last week noted. She is on high dose aspirin.   Past Medical History:  Diagnosis Date  . Allergy   . Arthritis    "maybe in my lower back" (08/27/2012)  . 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 Chronic combined systolic and diastolic CHF (congestive heart failure) ([place])   . CKD (chronic kidney disease), stage IV ([place])   . Coronary artery disease    a. NSTEMI in 05/2009 s/p CABG (LIMA-LAD, SVG-diagonal, SVG-OM1/OM 2).   . Family history of anesthesia complication    Mom has a hard time to wake up  . Foot ulcer ([place])    "I've had them on both feet" (08/27/2012)  .  Gouty arthritis    "right index finger" (08/27/2012)  . History of radiation therapy 06/09/13-07/06/13   left breast 50Gy  . Hyperlipidemia   . Hypertension   . Infection    right second toe  . Ischemic cardiomyopathy    a. 2011: EF 40-45%. b. EF 55-60% in 04/2017 but shortly after as inpatient was 45-50%.  Marland Kitchen LBBB (left bundle branch block)   . Mitral regurgitation   . Neuropathy    Hx; of B/L feet  . NSTEMI (non-ST elevated myocardial infarction) ([place]) 05/23/2009  . Osteomyelitis of foot ([place])    . PAD (peripheral artery disease) ([facility])    a. LE PAD (patient previously elected hold off L fem-pop), prev followed by Dr. Bridgett Larsson  . PONV (postoperative nausea and vomiting)   . Sinus headache    "occasionally" (08/27/2012)  . Type II diabetes mellitus ([place])   . Vitamin D deficiency     Past Surgical History:  Procedure Laterality Date  . AMPUTATION Right 08/27/2012   Procedure: AMPUTATION RAY;  Surgeon: Newt Minion, MD;  Location: [facility];  Service: Orthopedics;  Laterality: Right;  Right Foot 5th Ray Amputation  . AMPUTATION Left 07/08/2013   Procedure: AMPUTATION RAY;  Surgeon: Newt Minion, MD;  Location: [facility];  Service: Orthopedics;  Laterality: Left;  Left Foot 2nd Ray Amputation  . AMPUTATION RAY Right 08/27/2012   5th ray/notes 08/27/2012  . AMPUTATION TOE Right 03/06/2017   Procedure: AMPUTATION TOE, INTERPHANGEAL 2ND RIGHT;  Surgeon: [name], DPM;  Location: [facility];  Service: Podiatry;  Laterality: Right;  . BREAST LUMPECTOMY  04/20/2013   with biopsy      DR WAKEFIELD  . BREAST LUMPECTOMY WITH NEEDLE LOCALIZATION AND AXILLARY SENTINEL LYMPH NODE BX Left 04/20/2013   Procedure: LEFT BREAST WIRE GUIDED LUMPECTOMY AND AXILLARY SENTINEL LYMPH NODE BX;  Surgeon: Rolm Bookbinder, MD;  Location: [facility];  Service: General;  Laterality: Left;  . CARDIAC CATHETERIZATION  05/24/2009   Archie Endo 05/24/2009 (08/27/2012)  . CATARACT EXTRACTION W/ INTRAOCULAR LENS  IMPLANT, BILATERAL  2000's  . COLONOSCOPY W/ BIOPSIES AND POLYPECTOMY  2010  . CORONARY ARTERY BYPASS GRAFT  2011   "CABG X4" (08/27/2012)  . DENTAL SURGERY  04/30/11   "1 implant" (08/27/2012)  . DILATION AND CURETTAGE OF UTERUS    . EYE SURGERY    . FINGER SURGERY Right    "index finger; turned out to be gout" (08/27/2012)     Medications: Current Meds  Medication Sig  . allopurinol (ZYLOPRIM) 100 MG tablet TAKE 1 TABLET TWICE DAILY (Patient taking differently: Take 100 mg by mouth two times a day)  . anastrozole  (ARIMIDEX) 1 MG tablet Take 1 tablet (1 mg total) by mouth daily.  Marland Kitchen aspirin-acetaminophen-caffeine (EXCEDRIN MIGRAINE) 250-250-65 MG tablet Take 1 tablet by mouth daily as needed (for sinus headaches.).  Marland Kitchen atorvastatin (LIPITOR) 80 MG tablet Take 1 tablet (80 mg total) by mouth daily at 6 PM.  . beta carotene w/minerals (OCUVITE) tablet Take 1 tablet by mouth daily.  . carvedilol (COREG) 25 MG tablet Take 1 tablet (25 mg total) by mouth 2 (two) times daily with a meal.  . Cholecalciferol (VITAMIN D3) 2000 units TABS Take 2,000 Units by mouth daily.   Marland Kitchen docusate sodium (COLACE) 100 MG capsule Take 100 mg by mouth at bedtime.  . fluticasone (FLONASE) 50 MCG/ACT nasal spray Place 2 sprays into both nostrils daily as needed for allergies or rhinitis.  . furosemide (LASIX) 40  MG tablet Take 1 tablet (40 mg total) by mouth daily.  . hydrALAZINE (APRESOLINE) 25 MG tablet Take 3 tablets (75 mg total) by mouth every 8 (eight) hours.  . insulin NPH-regular Human (NOVOLIN 70/30) (70-30) 100 UNIT/ML injection Inject 30-65 Units into the skin See admin instructions. 60-65 units into the skin in the morning before breakfast & 30-35 units in the evening, depending on BGL  . isosorbide dinitrate (ISORDIL) 10 MG tablet Take 1 tablet (10 mg total) by mouth 3 (three) times daily.  . NON FORMULARY [employer] Tonic and Homeopathic Remedy: Mix 1 ounce with water and drink one to two times a day (5-6 times a week)  . Polyethyl Glycol-Propyl Glycol (LUBRICANT EYE DROPS) 0.4-0.3 % SOLN Place 1-2 drops into both eyes 3 (three) times daily as needed (for dry eyes.).  . [DISCONTINUED] aspirin 325 MG tablet Take 325 mg by mouth at bedtime.   . [DISCONTINUED] furosemide (LASIX) 40 MG tablet Take 1 tablet (40 mg total) by mouth daily.     Allergies: Allergies  Allergen Reactions  . Atenolol Other (See Comments)    Exacerbates gout  . Contrast Media [Iodinated Diagnostic Agents] Rash  . Latex Rash  . Omnipaque  [Iohexol] Rash    Social History: The patient  reports that  has never smoked. she has never used smokeless tobacco. She reports that she drinks alcohol. She reports that she does not use drugs.   Family History: The patient's family history includes Breast cancer in her maternal aunt; Breast cancer (age of onset: 63) in her sister; Breast cancer (age of onset: 78) in her sister; Hypertension in her brother; Kidney cancer (age of onset: 61) in her brother; Throat cancer (age of onset: 29) in her father.   Review of Systems: Please see the history of present illness.   Otherwise, the review of systems is positive for none.   All other systems are reviewed and negative.   Physical Exam: VS:  BP (!) 160/76 (BP Location: Left Arm, Patient Position: Sitting, Cuff Size: Normal)   Pulse 69   Ht 5\' 5"  (1.651 m)   Wt 174 lb (78.9 kg)   SpO2 100% Comment: at rest  BMI 28.96 kg/m  .  BMI Body mass index is 28.96 kg/m.  Wt Readings from Last 3 Encounters:  05/18/17 174 lb (78.9 kg)  05/11/17 171 lb 9.6 oz (77.8 kg)  05/07/17 178 lb 6.4 oz (80.9 kg)    General: Pleasant. Well developed, well nourished and in no acute distress.   HEENT: Normal.  Neck: Supple, no JVD, carotid bruits, or masses noted.  Cardiac: Regular rate and rhythm. Not too much murmur noted. No edema.  Respiratory:  Lungs are clear to auscultation bilaterally with normal work of breathing.  GI: Soft and nontender.  MS: No deformity or atrophy. Gait and ROM intact.  Skin: Warm and dry. Color is normal.  Neuro:  Strength and sensation are intact and no gross focal deficits noted.  Psych: Alert, appropriate and with normal affect.   LABORATORY DATA:  EKG:  EKG is not ordered today.  Lab Results  Component Value Date   WBC 7.2 05/11/2017   HGB 10.8 (L) 05/11/2017   HCT 32.2 (L) 05/11/2017   PLT 198 05/11/2017   GLUCOSE 88 05/11/2017   CHOL 120 05/11/2017   TRIG 137 05/11/2017   HDL 33 (L) 05/11/2017   LDLCALC 74  05/04/2017   ALT 27 05/11/2017   AST 26 05/11/2017  NA 141 05/11/2017   K 4.3 05/11/2017   CL 102 05/11/2017   CREATININE 3.71 (H) 05/11/2017   BUN 89 (H) 05/11/2017   CO2 28 05/11/2017   TSH 2.05 04/28/2017   INR 0.91 07/08/2013   HGBA1C 7.0 (H) 04/28/2017   MICROALBUR 224.6 01/22/2017     BNP (last 3 results) Recent Labs    05/03/17 1951  BNP 784.6*    ProBNP (last 3 results) No results for input(s): PROBNP in the last 8760 hours.   Other Studies Reviewed Today:  2D Echo 05-05-2017 Study Conclusions  - Left ventricle: The cavity size was normal. Wall thickness was increased in a pattern of mild LVH. Systolic function was mildly reduced. The estimated ejection fraction was in the range of 45% to 50%. Diffuse hypokinesis. Features are consistent with a pseudonormal left ventricular filling pattern, with concomitant abnormal relaxation and increased filling pressure (grade 2 diastolic dysfunction). - Aortic valve: Poorly visualized. Probably trileaflet; mildly calcified leaflets. There was no stenosis. - Mitral valve: Severely calcified annulus. Moderately calcified leaflets . The findings are consistent with trivial stenosis. There was moderate regurgitation. Pressure half-time: 77 ms. Mean gradient (D): 6 mm Hg. - Left atrium: The atrium was mildly dilated. - Right ventricle: The cavity size was normal. Systolic function was normal. - Pulmonary arteries: PA peak pressure: 32 mm Hg (S). - Inferior vena cava: The vessel was normal in size. The respirophasic diameter changes were in the normal range (>= 50%), consistent with normal central venous pressure.  Impressions:  - Normal LV size with mild LV hypertrophy. EF 45-50%, diffuse hypokinesis. Normal RV size and systolic function. Heavily calcified mitral valve and annulus. There was moderate mitral regurgitation and probably only trivial mitral stenosis (mean gradient 6  mmHg but PHT 77 msec suggesting valve area in normal range).    Assessment/Plan:  1. Acute on chronic combined CHF - It was suspected that this was due to excessive salt and poor BP control. MV disease may be a culprit as well as possible graft failure and progressive renal disease. She has improved clinically. She is doing well with salt restriction. Weight is down - further her scales at home. Needs lab today. Cutting Lasix back to 40 mg a day. We discussed trying to stick to 1500 mg sodium restriction.   2. CAD with remote MI/CABG - multiple risk factors - troponins were flat - will arrange Lexiscan to rule out ischemia as the etiology. No recent chest pain but pretty sedentary as a general rule. Cutting aspirin back to just 81 mg a day.   3. HLD - on high dose statin - needs labs on return.   4. Moderate MR noted on most recent echo - will need close follow up - defer repeat timing to Dr. Johnsie Cancel. May need to consider TEE if she has recurrent bouts of heart failure.   5. HTN - great BP control at home. She is to continue to monitor - no changes made today.   6. CKD - recheck lab today - BMET from last week noted - Lasix cut back to 40 mg a day. She is seeing Dr. Hollie Salk on Wednesday.   Current medicines are reviewed with the patient today.  The patient does not have concerns regarding medicines other than what has been noted above.  The following changes have been made:  See above.  Labs/ tests ordered today include:    Orders Placed This Encounter  Procedures  . Basic metabolic panel  .  CBC  . MYOCARDIAL PERFUSION IMAGING     Disposition:   FU with Dr. Johnsie Cancel after the [facility] for further recommendations.   Patient is agreeable to this plan and will call if any problems develop in the interim.   SignedTruitt Merle, NP  05/18/2017 12:24 PM  [facility] [address] [place], [place]  [address] Phone: [phone] Fax:  [phone]

## 2017-05-18 NOTE — Patient Outreach (Signed)
[facility]) Care Management  [place]   05/18/2017  Lelah Rennaker [facility] Jan 11, 1946 [id number]  Subjective: Patient was called regarding medication management per referral. HIPAA identifiers were obtained.  Patient is a 72 year old female with multiple medical conditions including but not limited to:  Asthma, CKD stage, CAD, type 2 diabetes, hypertension, hyperlipidemia, breast cancer, diastolic heart failure, PVD, and vitamin D deficiency.  Patient was hospitalized 05/03/17 for dyspnea  Patient manages her medications on her own and uses [employer] [facility] [employer].  Patient declined pharmacy services but was agreeable to a medication reconciliation post discharge.   Objective:   Patient was seen by cardiology today.  Her blood pressure was elevated 160/76  Encounter Medications: Outpatient Encounter Medications as of 05/18/2017  Medication Sig Note  . allopurinol (ZYLOPRIM) 100 MG tablet TAKE 1 TABLET TWICE DAILY (Patient taking differently: Take 100 mg by mouth two times a day)   . anastrozole (ARIMIDEX) 1 MG tablet Take 1 tablet (1 mg total) by mouth daily.   Marland Kitchen aspirin EC 81 MG tablet Take 1 tablet (81 mg total) by mouth daily.   Marland Kitchen aspirin-acetaminophen-caffeine (EXCEDRIN MIGRAINE) 250-250-65 MG tablet Take 1 tablet by mouth daily as needed (for sinus headaches.). 05/18/2017: PRN Only  . atorvastatin (LIPITOR) 80 MG tablet Take 1 tablet (80 mg total) by mouth daily at 6 PM.   . beta carotene w/minerals (OCUVITE) tablet Take 1 tablet by mouth daily.   . carvedilol (COREG) 25 MG tablet Take 1 tablet (25 mg total) by mouth 2 (two) times daily with a meal.   . Cholecalciferol (VITAMIN D3) 2000 units TABS Take 2,000 Units by mouth daily.    Marland Kitchen docusate sodium (COLACE) 100 MG capsule Take 100 mg by mouth at bedtime.   . fluticasone (FLONASE) 50 MCG/ACT nasal spray Place 2 sprays into both nostrils daily as needed for allergies or rhinitis.   .  furosemide (LASIX) 40 MG tablet Take 1 tablet (40 mg total) by mouth daily.   . hydrALAZINE (APRESOLINE) 25 MG tablet Take 3 tablets (75 mg total) by mouth every 8 (eight) hours.   . insulin NPH-regular Human (NOVOLIN 70/30) (70-30) 100 UNIT/ML injection Inject 30-65 Units into the skin See admin instructions. 60-65 units into the skin in the morning before breakfast & 30-35 units in the evening, depending on BGL   . isosorbide dinitrate (ISORDIL) 10 MG tablet Take 1 tablet (10 mg total) by mouth 3 (three) times daily.   . NON FORMULARY [employer] Tonic and Homeopathic Remedy: Mix 1 ounce with water and drink one to two times a day (5-6 times a week)   . Polyethyl Glycol-Propyl Glycol (LUBRICANT EYE DROPS) 0.4-0.3 % SOLN Place 1-2 drops into both eyes 3 (three) times daily as needed (for dry eyes.).    No facility-administered encounter medications on file as of 05/18/2017.     Functional Status: In your present state of health, do you have any difficulty performing the following activities: 05/04/2017 05/04/2017  Hearing? - -  Vision? - -  Difficulty concentrating or making decisions? - -  Walking or climbing stairs? - -  Dressing or bathing? - -  Doing errands, shopping? N N  Comment - -  Some recent data might be hidden    Fall/Depression Screening: Fall Risk  10/13/2016 07/01/2016 04/06/2015  Falls in the past year? No No No  Injury with Fall? - - -   PHQ 2/9 Scores 10/13/2016  07/01/2016 04/05/2015 02/14/2015  PHQ - 2 Score 0 0 0 0      Assessment:  ASSESSMENT: Date Discharged from Hospital: 05/03/2017 Date Medication Reconciliation Performed: 05/18/2017  Medications Discontinued at Discharge: (Delete if not applicable)  HCTZ  Rosuvastatin  New Medications at Discharge: (delete if applicable)  Atorvastatin   Changed medications: Hydralazine Carvedilol  Patient was recently discharged from hospital and all medications have been reviewed   Drugs sorted by  system:  Neurologic/Psychologic:  Cardiovascular: Carvedilol Atorvastatin Aspirin Hydralazine Furosemide Isosorbide Dinitrate   Pulmonary/Allergy: Fluticasone  Gastrointestinal: Docusate Sodium   Endocrine: Novolin 70/30  Renal: Allopurinol  Pain: Aspirin-Acetaminophen-Caffeine  Vitamins/Minerals: Ocuvite Cholecalciferol  Infectious Diseases: Lubricant Eye Drops  Anti-neoplastic anastrozole   PLAN: No medication problems noted. Patient was encouraged to follow her physicians' instructions with her new medication changes and sodium intake.   Patient was provided with the pharmacist's contact information for future questions or concerns.  Elayne Guerin, PharmD, [place] Clinical Pharmacist [phone]

## 2017-05-19 ENCOUNTER — Telehealth: Payer: Self-pay | Admitting: Nurse Practitioner

## 2017-05-19 ENCOUNTER — Telehealth (HOSPITAL_COMMUNITY): Payer: Self-pay | Admitting: *Deleted

## 2017-05-19 LAB — CBC
Hematocrit: 34.7 % (ref 34.0–46.6)
Hemoglobin: 11.7 g/dL (ref 11.1–15.9)
MCH: 29.5 pg (ref 26.6–33.0)
MCHC: 33.7 g/dL (ref 31.5–35.7)
MCV: 87 fL (ref 79–97)
Platelets: 227 10*3/uL (ref 150–379)
RBC: 3.97 x10E6/uL (ref 3.77–5.28)
RDW: 16 % — ABNORMAL HIGH (ref 12.3–15.4)
WBC: 10.2 10*3/uL (ref 3.4–10.8)

## 2017-05-19 LAB — BASIC METABOLIC PANEL
BUN/Creatinine Ratio: 23 (ref 12–28)
BUN: 76 mg/dL (ref 8–27)
CO2: 23 mmol/L (ref 20–29)
Calcium: 9.4 mg/dL (ref 8.7–10.3)
Chloride: 104 mmol/L (ref 96–106)
Creatinine, Ser: 3.36 mg/dL — ABNORMAL HIGH (ref 0.57–1.00)
GFR calc Af Amer: 15 mL/min/{1.73_m2} — ABNORMAL LOW (ref >59)
GFR calc non Af Amer: 13 mL/min/{1.73_m2} — ABNORMAL LOW (ref >59)
Glucose: 72 mg/dL (ref 65–99)
Potassium: 3.9 mmol/L (ref 3.5–5.2)
Sodium: 143 mmol/L (ref 134–144)

## 2017-05-19 NOTE — Telephone Encounter (Signed)
Patient given detailed instructions per Myocardial Perfusion Study Information Sheet for the test on 05/21/17. Patient notified to arrive 15 minutes early and that it is imperative to arrive on time for appointment to keep from having the test rescheduled.  If you need to cancel or reschedule your appointment, please call the office within 24 hours of your appointment. . Patient verbalized understanding. Kirstie Peri

## 2017-05-19 NOTE — Telephone Encounter (Signed)
Patient returning your call.

## 2017-05-20 ENCOUNTER — Ambulatory Visit: Payer: Self-pay | Admitting: *Deleted

## 2017-05-20 ENCOUNTER — Encounter: Payer: Self-pay | Admitting: *Deleted

## 2017-05-20 DIAGNOSIS — E1122 Type 2 diabetes mellitus with diabetic chronic kidney disease: Secondary | ICD-10-CM | POA: Diagnosis not present

## 2017-05-20 DIAGNOSIS — I5042 Chronic combined systolic (congestive) and diastolic (congestive) heart failure: Secondary | ICD-10-CM | POA: Diagnosis not present

## 2017-05-20 DIAGNOSIS — I129 Hypertensive chronic kidney disease with stage 1 through stage 4 chronic kidney disease, or unspecified chronic kidney disease: Secondary | ICD-10-CM | POA: Diagnosis not present

## 2017-05-20 DIAGNOSIS — N184 Chronic kidney disease, stage 4 (severe): Secondary | ICD-10-CM | POA: Diagnosis not present

## 2017-05-21 ENCOUNTER — Other Ambulatory Visit: Payer: Self-pay | Admitting: *Deleted

## 2017-05-21 ENCOUNTER — Ambulatory Visit (HOSPITAL_COMMUNITY): Payer: Medicare Other | Attending: Cardiovascular Disease

## 2017-05-21 DIAGNOSIS — I5022 Chronic systolic (congestive) heart failure: Secondary | ICD-10-CM | POA: Diagnosis not present

## 2017-05-21 DIAGNOSIS — I447 Left bundle-branch block, unspecified: Secondary | ICD-10-CM | POA: Diagnosis not present

## 2017-05-21 DIAGNOSIS — R079 Chest pain, unspecified: Secondary | ICD-10-CM | POA: Insufficient documentation

## 2017-05-21 DIAGNOSIS — E119 Type 2 diabetes mellitus without complications: Secondary | ICD-10-CM | POA: Diagnosis not present

## 2017-05-21 DIAGNOSIS — R0609 Other forms of dyspnea: Secondary | ICD-10-CM | POA: Insufficient documentation

## 2017-05-21 DIAGNOSIS — I2510 Atherosclerotic heart disease of native coronary artery without angina pectoris: Secondary | ICD-10-CM | POA: Diagnosis not present

## 2017-05-21 DIAGNOSIS — I10 Essential (primary) hypertension: Secondary | ICD-10-CM | POA: Insufficient documentation

## 2017-05-21 DIAGNOSIS — Z951 Presence of aortocoronary bypass graft: Secondary | ICD-10-CM | POA: Insufficient documentation

## 2017-05-21 LAB — MYOCARDIAL PERFUSION IMAGING
LV dias vol: 121 mL (ref 46–106)
LV sys vol: 68 mL
Peak HR: 82 {beats}/min
RATE: 0.3
Rest HR: 77 {beats}/min
SDS: 8
SRS: 2
SSS: 10
TID: 1.02

## 2017-05-21 MED ORDER — TECHNETIUM TC 99M TETROFOSMIN IV KIT
10.3000 | PACK | Freq: Once | INTRAVENOUS | Status: AC | PRN
  Administered 2017-05-21: 10.3 via INTRAVENOUS
  Filled 2017-05-21: qty 11

## 2017-05-21 MED ORDER — TECHNETIUM TC 99M TETROFOSMIN IV KIT
32.6000 | PACK | Freq: Once | INTRAVENOUS | Status: AC | PRN
  Administered 2017-05-21: 32.6 via INTRAVENOUS
  Filled 2017-05-21: qty 33

## 2017-05-21 MED ORDER — REGADENOSON 0.4 MG/5ML IV SOLN
0.4000 mg | Freq: Once | INTRAVENOUS | Status: AC
  Administered 2017-05-21: 0.4 mg via INTRAVENOUS

## 2017-05-21 NOTE — Patient Outreach (Signed)
[facility]) Care Management  05/21/2017  [name] [facility] 22-Sep-1945 [id number]  EMMI- Heart Failure RED ON EMMI ALERT DAY#: 12 DATE:05/20/17 RED ALERT: New or worsening problems? Yes   Outreach attempt #1 to patient. No answer. RN CM left HIPAA compliant message along with contact info.    Plan: RN CM will contact patient within one week.    Lake Bells, RN, BSN, MHA/MSL, [place] Telephonic Care Manager Coordinator [facility] Direct Phone: [phone] Toll Free: [phone] Fax: [phone]

## 2017-05-22 ENCOUNTER — Other Ambulatory Visit: Payer: Self-pay | Admitting: *Deleted

## 2017-05-22 ENCOUNTER — Encounter: Payer: Self-pay | Admitting: *Deleted

## 2017-05-23 NOTE — Telephone Encounter (Signed)
This encounter was created in error - please disregard.

## 2017-05-23 NOTE — Patient Outreach (Signed)
[name]) Care Management  05/22/2017  [name] 17-Feb-1946 [id number]  Telephone Assessment  Outgoing telephone call to patient. HIPAA identifiers verified with patient. Spoke with patient. Reviewed and addressed red alert. Patient confirmed having new or worsening problems. Patient reported, gaining weight and notifying the MD. She verbalized her Furosemide wasincreased from 40 mg to 80 mg. She was discharged home with 30 tabs of Furosemide from the hosptial. Per patient, the Furosemide was depleting quicker due to the increase in the dosage. Patient stated, she had to do a "stop prescription due to length of time [employer] needed to clear the medication". She believes the increase in her Furesomide has helped decrease her congestion and coughing. Patient stated, she has seen every doctor involved in her care this week. She verbalized seeing her primary MD, Nephrologist, and Cardiologist. She verbalized having a stress test today. She has a scheduled visit on Tuesday with the Podiatrist. On Wednesday, she will have lab work completed. Patient stated, the MD wants to monitor her kidney function closer due to the increase in her Furosemide. Patient discussed monitoring her weight and B/P at home. She weighs daily with her clothes and shoes on. She wants the scale at the MD's office and her scale to be around the same range. She stated, "I don't care about weight or being 120 pounds".  She wants to be aware of her weight gain/fluid overload. Patient and RN CM discussed signs/symptoms of heart failure. Patient verebalized receiving the [facility] Calendar and EMMI educational materials. She verbalized using the calendar to document her vitals. RN CM and patient discussed heart failure zones. Patient discussed how she measures her fluid intake with the use of  32 ounce Tumbler. She fills the Tumbler twice per day. She may substitute coffee or tea, occasionally. She stated, she had spoken with  the doctors about the use of Tumblers when measurig her fluid intake. Patient reported, a weight range of 171 to 173 pounds, which is her baseline.   Plan: RN CM will contact patient within one week.  RN CM advised patient to contact RNCM for any needs or concerns.  Lake Bells, RN, BSN, MHA/MSL, [place] Telephonic Care Manager Coordinator [facility] Direct Phone: [phone] Cell Phone: [phone] Toll Free: [phone] Fax: [phone]

## 2017-05-25 ENCOUNTER — Ambulatory Visit (INDEPENDENT_AMBULATORY_CARE_PROVIDER_SITE_OTHER): Payer: Medicare Other

## 2017-05-25 ENCOUNTER — Ambulatory Visit: Payer: Medicare Other

## 2017-05-25 ENCOUNTER — Encounter: Payer: Self-pay | Admitting: Podiatry

## 2017-05-25 ENCOUNTER — Ambulatory Visit (INDEPENDENT_AMBULATORY_CARE_PROVIDER_SITE_OTHER): Payer: Medicare Other | Admitting: Podiatry

## 2017-05-25 DIAGNOSIS — B351 Tinea unguium: Secondary | ICD-10-CM

## 2017-05-25 DIAGNOSIS — M79675 Pain in left toe(s): Secondary | ICD-10-CM

## 2017-05-25 DIAGNOSIS — I2510 Atherosclerotic heart disease of native coronary artery without angina pectoris: Secondary | ICD-10-CM | POA: Diagnosis not present

## 2017-05-25 DIAGNOSIS — Z899 Acquired absence of limb, unspecified: Secondary | ICD-10-CM | POA: Diagnosis not present

## 2017-05-25 DIAGNOSIS — M2041 Other hammer toe(s) (acquired), right foot: Secondary | ICD-10-CM | POA: Diagnosis not present

## 2017-05-25 DIAGNOSIS — E1149 Type 2 diabetes mellitus with other diabetic neurological complication: Secondary | ICD-10-CM

## 2017-05-25 DIAGNOSIS — M79674 Pain in right toe(s): Secondary | ICD-10-CM | POA: Diagnosis not present

## 2017-05-25 DIAGNOSIS — L97521 Non-pressure chronic ulcer of other part of left foot limited to breakdown of skin: Secondary | ICD-10-CM

## 2017-05-25 DIAGNOSIS — M779 Enthesopathy, unspecified: Secondary | ICD-10-CM

## 2017-05-26 ENCOUNTER — Other Ambulatory Visit: Payer: Self-pay | Admitting: *Deleted

## 2017-05-26 ENCOUNTER — Ambulatory Visit: Payer: Medicare Other | Admitting: *Deleted

## 2017-05-26 NOTE — Patient Outreach (Signed)
[facility] [facility]) Care Management  05/26/2017  My[place] Labo [facility] [birth year] [id number]   EMMI- Heart Failure RED ON EMMI ALERT DAY#: 15 DATE:05/23/17 RED ALERT: New or worsening problems? Yes  Outreach attempt #1 to patient. No answer. RN CM left HIPAA compliant message along with contact info.    Plan: RN CM will contact patient within one week.    Lake Bells, RN, BSN, MHA/MSL, [place] Telephonic Care Manager Coordinator [facility] Direct Phone: [phone] Cell Phone: [phone] Toll Free: [phone] Fax: [phone]

## 2017-05-27 ENCOUNTER — Other Ambulatory Visit: Payer: Self-pay | Admitting: *Deleted

## 2017-05-27 ENCOUNTER — Encounter: Payer: Self-pay | Admitting: *Deleted

## 2017-05-27 ENCOUNTER — Ambulatory Visit: Payer: Self-pay | Admitting: *Deleted

## 2017-05-27 DIAGNOSIS — N184 Chronic kidney disease, stage 4 (severe): Secondary | ICD-10-CM | POA: Diagnosis not present

## 2017-05-27 NOTE — Progress Notes (Signed)
Subjective: 72 y.o. returns the office today for painful, elongated, thickened toenails which she cannot trim herself. Denies any redness or drainage around the nails.  She also states that she has developed a wound on the left third toe.  She feels that this is where the toe on the big toe was rubbing into the third toe.  The wound has been getting better.  She denies any drainage or pus or any swelling or redness.  She has no other concerns today.  The right foot is been doing well.  Denies any acute changes since last appointment and no new complaints today. Denies any systemic complaints such as fevers, chills, nausea, vomiting.   PCP: Unk Pinto, MD   Objective: AAO 3, NAD DP/PT pulses palpable, CRT less than 3 seconds Protective sensation decreased with Simms Weinstein monofilament Nails hypertrophic, dystrophic, elongated, brittle, discolored 7. There is subjective tenderness overlying the nails 1, 3, 4, 5 on the left and 1, 3, 4 on the right.  On the medial aspect the left third toe is a superficial wound measuring about 0.4 x 0.4 x 0.2 cm.  There is no probing to bone, undermining or tunneling with a granular wound base.  There is no probing to bone.  No surrounding erythema, ascending sialitis.  There is no fluctuation or crepitation.  There is no malodor. No open lesions or pre-ulcerative lesions are identified. No other areas of tenderness bilateral lower extremities. No overlying edema, erythema, increased warmth. No pain with calf compression, swelling, warmth, erythema.  Assessment: Patient presents with symptomatic onychomycosis; left third digit ulceration  Plan: -Treatment options including alternatives, risks, complications were discussed -Nails sharply debrided 7 without complication/bleeding. -Wound was cleaned and the left third toe.  Recommend small amount of antibiotic ointment and dressing change daily.  Offloading at all times.  Monitor for infection.  No  drainage so therefore did not culture.  No clinical signs of infection so we will hold off any oral antibiotics.  She recently just completed a course of doxycycline for another issue. -Discussed daily foot inspection. If there are any changes, to call the office immediately.  -Follow-up in 2 weeks for wound check or sooner if any problems are to arise. In the meantime, encouraged to call the office with any questions, concerns, changes symptoms.  Celesta Gentile, DPM

## 2017-05-27 NOTE — Patient Outreach (Signed)
[place] [facility]) Care Management  05/27/2017  [name] [facility] May 05, 1945 [id number]  EMMI-Heart Failure RED ON EMMI ALERT DAY#:15 DATE:05/23/17 RED ALERT:New or worsening problems? Yes  Outreach attempt #2 to patient.  No answer. RN CM left HIPAA compliant message along with contact info.   Plan: RN CM will send unsuccessful outreach letter to patient and will close case if no response from patient within 10 business days.  Lake Bells, RN, BSN, MHA/MSL, [place] Telephonic Care Manager Coordinator [facility] Direct Phone: [phone] Cell Phone: [phone] Toll Free: [phone] Fax: [phone]

## 2017-05-29 ENCOUNTER — Other Ambulatory Visit: Payer: Self-pay | Admitting: *Deleted

## 2017-05-29 NOTE — Patient Outreach (Signed)
[place] [facility]) Care Management  05/29/2017  [name] [facility] [birth year] [id number]   EMMI-Heart Failure RED ON EMMI ALERT DAY#:15 DATE:05/23/17 RED ALERT:New or worsening problems? Yes  Outgoing telephone to patient. HIPAA identifiers verified x's 2. Spoke with patient. Reviewed and addressed red alert. Patient reported, she is doing better. She stated, she is experiencing some congestion due to the rain. She is doing the best she can to get rid of the congestion, Patient stated, she's just been so busy this week. Her son and grandchildren are visiting from [place] and she prioritized them first. Overall, she is doing much better. She continues her same routine. Patient had no questions or concerns at this time. Patient knows to contact her MD for any questions or concerns. Patient voiced understanding and was appreciative of f/u call.  Plan: RN CM will notify [facility] Case Management Assistant regarding case closure.   Lake Bells, RN, BSN, MHA/MSL, [place] Telephonic Care Manager Coordinator [facility] Direct Phone: [phone] Cell Phone: [phone] Toll Free: [phone] Fax: [phone]

## 2017-06-01 ENCOUNTER — Encounter: Payer: Self-pay | Admitting: Internal Medicine

## 2017-06-01 NOTE — Progress Notes (Signed)
Assessment and Plan:  [name] was seen today for edema.  Diagnoses and all orders for this visit:  Chronic combined systolic and diastolic congestive heart failure ([place]) Weights increasing but without edema, dyspnea. Hx of cardiogenic pulmonary edema but remains CTA today with stable O2 sats, no distress or increased respiratory effort- continue to cut back sodium/sugar - monitor weight daily and bring logs to appointments - no more than 64 oz of water a day or less  Abdominal distension Non-acute, without pain, but with new constipation/slow transit -  -     US Abdomen Complete; Future  Constipation, unspecified constipation type       Continue miralax, discussed fiber/vs water intake, increase activity       Add sennakot daily which is gentle and safe on kidneys and continue to monitor closely - call office with any concerns, constipation not improving with continued weight gain.  CKD stage 5, GFR 15 (HCC) Followed closely by Dr. Madelon Lips at [facility]; just seen last week with labs; will not repeat today as no significant changes -   Further disposition pending results of labs. Discussed med's effects and SE's.   Over 30 minutes of exam, counseling, chart review, and critical decision making was performed.   Future Appointments  Date Time Provider [place]  06/08/2017 12:15 PM [name], DPM [facility]  06/09/2017 10:00 AM Avel Peace, Ree Kida, RN [facility] None  08/04/2017  3:00 PM Unk Pinto, MD [facility] None  08/21/2017  2:40 PM Nahser, Wonda Cheng, MD [facility]  02/10/2018 11:00 AM [facility] 1 [facility] None  02/10/2018 11:30 AM Magrinat, Virgie Dad, MD [facility] None    ------------------------------------------------------------------------------------------------------------------   HPI BP (!) 150/70   Pulse 74   Temp 97.7 F (36.5 C)   Ht 5\' 5"  (1.651 m)   Wt 177 lb (80.3 kg)   SpO2 96%   BMI 29.45 kg/m    72 y.o.female presents for concerns about weight gain and ?abdominal distension. She is recovering from recent hospitalization 1/27-1/31/2019 for acute on chronic CHF, followed closely by cardiology and heart failure follow up program. She reports discharge weight of 171# which has slowly increased - today is 176# on home scale. She reports previous flare demonstrated LE edema, dyspnea, paroxysmal nocturnal dyspnea; denies any of these symptoms today. She has continued with adherence to fluid intake recommendations as well as diuretics (lasix 80 mg daily, hydralazine 25 mg TID). She is also followed closely by Dr. Hollie Salk for stage IV/V CKD; reports just had follow up last week with labs with no changes to plan. Discussing dialysis but has not scheduled access placement yet.   She has a hx of CAD (NSTEMI 2011 s/p CABG per Dr. Cyndia Bent), chronic combined CHF/ICM, CKD stage IV/V ((baseline Cr around 2.7-3.1), HTN, HLD, DM, LE PAD (patient previously elected hold off L fem-pop), LBBB, arthritis, breast CA s/p lumpectomy/radiation, & gout.   She has a history of Combined Systolic and Diastolic, denies dyspnea on exertion, orthopnea, paroxysmal nocturnal dyspnea and edema. Positive for none. Wt Readings from Last 3 Encounters:  06/02/17 177 lb (80.3 kg)  05/21/17 174 lb (78.9 kg)  05/18/17 174 lb (78.9 kg)   05/04/2017 ECHO:  - Normal LV size with mild LV hypertrophy. EF 45-50%, diffuse   hypokinesis. Normal RV size and systolic function. Heavily   calcified mitral valve and annulus. There was moderate mitral   regurgitation and probably only trivial mitral stenosis (mean   gradient 6 mmHg but  PHT 77 msec suggesting valve area in normal   range).  Past Medical History:  Diagnosis Date  . Allergy   . Arthritis    "maybe in my lower back" (08/27/2012)  . 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 Chronic combined systolic and diastolic CHF (congestive  heart failure) ([place])   . CKD (chronic kidney disease), stage IV ([place])   . Coronary artery disease    a. NSTEMI in 05/2009 s/p CABG (LIMA-LAD, SVG-diagonal, SVG-OM1/OM 2).   . Family history of anesthesia complication    Mom has a hard time to wake up  . Foot ulcer ([place])    "I've had them on both feet" (08/27/2012)  . Gouty arthritis    "right index finger" (08/27/2012)  . History of radiation therapy 06/09/13-07/06/13   left breast 50Gy  . Hyperlipidemia   . Hypertension   . Infection    right second toe  . Ischemic cardiomyopathy    a. 2011: EF 40-45%. b. EF 55-60% in 04/2017 but shortly after as inpatient was 45-50%.  Marland Kitchen LBBB (left bundle branch block)   . Mitral regurgitation   . Neuropathy    Hx; of B/L feet  . NSTEMI (non-ST elevated myocardial infarction) ([place]) 05/23/2009  . Osteomyelitis of foot ([place])   . PAD (peripheral artery disease) (HCC)    a. LE PAD (patient previously elected hold off L fem-pop), prev followed by Dr. Bridgett Larsson  . PONV (postoperative nausea and vomiting)   . Sinus headache    "occasionally" (08/27/2012)  . Type II diabetes mellitus ([place])   . Vitamin D deficiency      Allergies  Allergen Reactions  . Atenolol Other (See Comments)    Exacerbates gout  . Contrast Media [Iodinated Diagnostic Agents] Rash  . Latex Rash  . Omnipaque [Iohexol] Rash    Current Outpatient Medications on File Prior to Visit  Medication Sig  . allopurinol (ZYLOPRIM) 100 MG tablet TAKE 1 TABLET TWICE DAILY (Patient taking differently: Take 100 mg by mouth two times a day)  . anastrozole (ARIMIDEX) 1 MG tablet Take 1 tablet (1 mg total) by mouth daily.  Marland Kitchen aspirin EC 81 MG tablet Take 1 tablet (81 mg total) by mouth daily.  Marland Kitchen aspirin-acetaminophen-caffeine (EXCEDRIN MIGRAINE) 250-250-65 MG tablet Take 1 tablet by mouth daily as needed (for sinus headaches.).  Marland Kitchen atorvastatin (LIPITOR) 80 MG tablet Take 1 tablet (80 mg total) by mouth daily at 6 PM.  . beta carotene w/minerals  (OCUVITE) tablet Take 1 tablet by mouth daily.  . carvedilol (COREG) 25 MG tablet Take 1 tablet (25 mg total) by mouth 2 (two) times daily with a meal.  . Cholecalciferol (VITAMIN D3) 2000 units TABS Take 2,000 Units by mouth daily.   Marland Kitchen docusate sodium (COLACE) 100 MG capsule Take 100 mg by mouth at bedtime.  . fluticasone (FLONASE) 50 MCG/ACT nasal spray Place 2 sprays into both nostrils daily as needed for allergies or rhinitis.  . furosemide (LASIX) 40 MG tablet Take 1 tablet (40 mg total) by mouth daily. (Patient taking differently: Take 80 mg by mouth daily. )  . hydrALAZINE (APRESOLINE) 25 MG tablet Take 3 tablets (75 mg total) by mouth every 8 (eight) hours.  . insulin NPH-regular Human (NOVOLIN 70/30) (70-30) 100 UNIT/ML injection Inject 30-65 Units into the skin See admin instructions. 60-65 units into the skin in the morning before breakfast & 30-35 units in the evening, depending on BGL  . isosorbide  dinitrate (ISORDIL) 10 MG tablet Take 1 tablet (10 mg total) by mouth 3 (three) times daily.  . NON FORMULARY [employer] Tonic and Homeopathic Remedy: Mix 1 ounce with water and drink one to two times a day (5-6 times a week)  . Polyethyl Glycol-Propyl Glycol (LUBRICANT EYE DROPS) 0.4-0.3 % SOLN Place 1-2 drops into both eyes 3 (three) times daily as needed (for dry eyes.).   No current facility-administered medications on file prior to visit.     ROS: Review of Systems  Constitutional: Negative for chills, diaphoresis, fever, malaise/fatigue and weight loss.  HENT: Negative for hearing loss and tinnitus.   Eyes: Negative for blurred vision and double vision.  Respiratory: Positive for shortness of breath (With exertion consistent with discharge baseline). Negative for cough, hemoptysis, sputum production and wheezing.   Cardiovascular: Negative for chest pain, palpitations, orthopnea, claudication and leg swelling.  Gastrointestinal: Positive for constipation (BM every third day -  new/atypical for her). Negative for abdominal pain, blood in stool, diarrhea, heartburn, melena, nausea and vomiting.  Genitourinary: Negative.  Negative for dysuria, flank pain, frequency, hematuria and urgency.  Musculoskeletal: Negative for joint pain and myalgias.  Skin: Negative for rash.  Neurological: Negative for dizziness, tingling, sensory change, weakness and headaches.  Endo/Heme/Allergies: Negative for polydipsia.  Psychiatric/Behavioral: Negative.   All other systems reviewed and are negative.    Physical Exam:  BP (!) 150/70   Pulse 74   Temp 97.7 F (36.5 C)   Ht 5\' 5"  (1.651 m)   Wt 177 lb (80.3 kg)   SpO2 96%   BMI 29.45 kg/m   General Appearance: Well nourished, in no apparent distress. Eyes: PERRLA, EOMs, conjunctiva no swelling or erythema Sinuses: No Frontal/maxillary tenderness ENT/Mouth: Ext aud canals clear, TMs without erythema, bulging. No erythema, swelling, or exudate on post pharynx.  Tonsils not swollen or erythematous. Hearing normal.  Neck: Supple, thyroid normal.  Respiratory: Respiratory effort normal, BS equal bilaterally without rales, rhonchi, wheezing or stridor.  Cardio: RRR with ?split S2 no audible murmur. Brisk peripheral pulses without edema.  Abdomen: Soft, obese, ?mild distention, + BS.  Non tender, no guarding, rebound, masses. Lymphatics: Non tender without lymphadenopathy.  Musculoskeletal: Symmetrical strength, normal gait.  Skin: Warm, dry without rashes, lesions, ecchymosis.  Neuro: Cranial nerves intact. Normal muscle tone, no cerebellar symptoms. Sensation intact.  Psych: Awake and oriented X 3, normal affect, Insight and Judgment appropriate.     [name], NP 12:02 PM [facility]

## 2017-06-02 ENCOUNTER — Ambulatory Visit (INDEPENDENT_AMBULATORY_CARE_PROVIDER_SITE_OTHER): Payer: Medicare Other | Admitting: Adult Health

## 2017-06-02 ENCOUNTER — Encounter: Payer: Self-pay | Admitting: Adult Health

## 2017-06-02 VITALS — BP 150/70 | HR 74 | Temp 97.7°F | Ht 65.0 in | Wt 177.0 lb

## 2017-06-02 DIAGNOSIS — R140 Abdominal distension (gaseous): Secondary | ICD-10-CM

## 2017-06-02 DIAGNOSIS — I2510 Atherosclerotic heart disease of native coronary artery without angina pectoris: Secondary | ICD-10-CM | POA: Diagnosis not present

## 2017-06-02 DIAGNOSIS — N185 Chronic kidney disease, stage 5: Secondary | ICD-10-CM

## 2017-06-02 DIAGNOSIS — I5042 Chronic combined systolic (congestive) and diastolic (congestive) heart failure: Secondary | ICD-10-CM | POA: Diagnosis not present

## 2017-06-02 DIAGNOSIS — K5900 Constipation, unspecified: Secondary | ICD-10-CM

## 2017-06-02 NOTE — Patient Instructions (Signed)
  Can go up on miralax, add sennakot (over the counter) - continue to monitor weights, shortness of breath, swelling, keep [name] update    Constipation, Adult Constipation is when a person has fewer bowel movements in a week than normal, has difficulty having a bowel movement, or has stools that are dry, hard, or larger than normal. Constipation may be caused by an underlying condition. It may become worse with age if a person takes certain medicines and does not take in enough fluids. Follow these instructions at home: Eating and drinking   Eat foods that have a lot of fiber, such as fresh fruits and vegetables, whole grains, and beans.  Limit foods that are high in fat, low in fiber, or overly processed, such as french fries, hamburgers, cookies, candies, and soda.  Drink enough fluid to keep your urine clear or pale yellow. General instructions  Exercise regularly or as told by your health care provider.  Go to the restroom when you have the urge to go. Do not hold it in.  Take over-the-counter and prescription medicines only as told by your health care provider. These include any fiber supplements.  Practice pelvic floor retraining exercises, such as deep breathing while relaxing the lower abdomen and pelvic floor relaxation during bowel movements.  Watch your condition for any changes.  Keep all follow-up visits as told by your health care provider. This is important. Contact a health care provider if:  You have pain that gets worse.  You have a fever.  You do not have a bowel movement after 4 days.  You vomit.  You are not hungry.  You lose weight.  You are bleeding from the anus.  You have thin, pencil-like stools. Get help right away if:  You have a fever and your symptoms suddenly get worse.  You leak stool or have blood in your stool.  Your abdomen is bloated.  You have severe pain in your abdomen.  You feel dizzy or you faint. This information is not  intended to replace advice given to you by your health care provider. Make sure you discuss any questions you have with your health care provider. Document Released: 12/21/2003 Document Revised: 10/12/2015 Document Reviewed: 09/12/2015 Elsevier Interactive Patient Education  2018 [employer].

## 2017-06-03 ENCOUNTER — Other Ambulatory Visit: Payer: Self-pay

## 2017-06-03 NOTE — Patient Outreach (Signed)
[place] [facility]) Care Management  06/03/2017  [name] [facility] [birth year] [id number]       EMMI-HF RED ON EMMI ALERT Day # 25 Date: 06/02/17 Red Alert Reason: "New/worsening problems? Yes   New swelling? Yes   New/worsening SOB? Yes"   Outreach attempt # 1 to patient.Spoke with patient. Reviewed and addressed red alerts with patient. She voices that she saw NP at PCP office on yesterday. She reports that she noticed that her weight was gradually creeping up as she had gained three pounds within three weeks. She also states that her belly was more distended and swollen than usual. During office visit yesterday it was determined that she may possibly be having some issues with constipation which is causing the issue and/or possible liver issues. Patient reports that office ordered a sonogram on her liver and she is awaiting results. She denies any new and/or worsening swelling or other symptoms today. Weight remains stable. Patient very knowledgeable regarding her condition, how to manage and when to seek medical attention. She also voices that she has good family support system to help her out. She denies any further RN CM needs or concerns at this time. She is aware when,how and why to seek medical attention for changes in condition. Advised patient that they would continue to get automated EMMI- HF post discharge calls to assess how they are doing following recent hospitalization and will receive a call from a nurse if any of their responses were abnormal. Patient voiced understanding and was appreciative of f/u call.      Plan: RN CM will notify [facility] administrative assistant of case status.    Enzo Montgomery, RN,BSN,CCM [place] Management Telephonic Care Management Coordinator Direct Phone: [phone] Toll Free: [phone] Fax: [phone]

## 2017-06-04 ENCOUNTER — Other Ambulatory Visit: Payer: Self-pay

## 2017-06-04 NOTE — Patient Outreach (Signed)
[place] [facility]) Care Management  06/04/2017  [name] [facility] Nov 11, 1945 [id number]     EMMI- CHF RED ON EMMI ALERT Day # 26 Date: 06/03/17 Red Alert Reason: "Weighed themselves today? No"      Red on EMMI dashboard received. No outreach call warranted to patient at this time. RN CM addressed issue on previous call.      Plan: RN CM will notify [facility] administrative assistant of case status.    Enzo Montgomery, RN,BSN,CCM [place] Management Telephonic Care Management Coordinator Direct Phone: [phone] Toll Free: [phone] Fax: [phone]

## 2017-06-05 ENCOUNTER — Ambulatory Visit: Payer: Medicare Other | Admitting: Cardiovascular Disease

## 2017-06-08 ENCOUNTER — Other Ambulatory Visit: Payer: Self-pay

## 2017-06-08 ENCOUNTER — Encounter: Payer: Self-pay | Admitting: Podiatry

## 2017-06-08 ENCOUNTER — Ambulatory Visit (INDEPENDENT_AMBULATORY_CARE_PROVIDER_SITE_OTHER): Payer: Medicare Other | Admitting: Podiatry

## 2017-06-08 DIAGNOSIS — L97521 Non-pressure chronic ulcer of other part of left foot limited to breakdown of skin: Secondary | ICD-10-CM

## 2017-06-08 DIAGNOSIS — I2510 Atherosclerotic heart disease of native coronary artery without angina pectoris: Secondary | ICD-10-CM

## 2017-06-08 NOTE — Patient Outreach (Signed)
[place]) Care Management  06/08/2017  Jennalee Greaves [facility] 1945-11-16 [id number]     EMMI- HF RED ON EMMI ALERT Day # 28 Date: 06/05/17 Red Alert Reason: 'weighed themselves today? No"   Outreach attempt # 1 to patient. No answer at present.        Plan: RN CM will make outreach attempt to patient within one business day.    Enzo Montgomery, RN,BSN,CCM [name] Management Telephonic Care Management Coordinator Direct Phone: [phone] Toll Free: [phone] Fax: [phone]

## 2017-06-09 ENCOUNTER — Other Ambulatory Visit: Payer: Self-pay

## 2017-06-09 ENCOUNTER — Ambulatory Visit: Payer: Self-pay | Admitting: *Deleted

## 2017-06-09 NOTE — Progress Notes (Signed)
Subjective: [name] presents the office today for follow-up evaluation of left third toe ulceration.  She states that she has been changing the bandage daily and she feels that the wound is doing much better she is not having any drainage or pus or any redness or swelling.  She does keep a piece of tape as well as a piece of gauze overlying the area and she has been putting a small amount of antibiotic ointment on the wound daily.  She denies any new concerns today she has not no acute changes since I last saw her. Denies any systemic complaints such as fevers, chills, nausea, vomiting. No acute changes since last appointment, and no other complaints at this time.   Objective: AAO x3, NAD DP/PT pulses palpable bilaterally, CRT less than 3 seconds All the incisions in the prior surgery are well-healed.  The left third toe along the medial aspect is a superficial wound measuring approximately 0.3 x 0.3 cm and is superficial without any probing, undermining or tunneling.  There is appear to be limited to the breakdown of the skin at this time appears to be filling in.  Along the plantar aspect of the IPJ is what appears to be a skin fissure but the tape was rubbing into the skin.  There is no drainage or pus or probing.  This appears to be a superficial fissure.  There is a contractures are present as well as HAV. No open lesions or pre-ulcerative lesions.  No pain with calf compression, swelling, warmth, erythema  Assessment: Healing wound left third toe  Plan: -All treatment options discussed with the patient including all alternatives, risks, complications.  -I did clean the wound today.  Appears to be doing better.  Antibiotic ointment and a bandage was applied to both of the areas.  I discussed with her not to put tape on the toe this is cutting into the skin causing skin fissure.  We will try to wrap the toe with gauze.  At this time there is no signs of infection so we will hold off any oral  antibiotics.  Offloading at all times. -Follow-up in 2-3 weeks if symptoms not completely healed or sooner if any issues are to arise.  Call any questions or concerns meantime.  She agrees with this plan today.  She states that she is doing well she did not even know she had a wound on the toe. -Patient encouraged to call the office with any questions, concerns, change in symptoms.   [name] DPM

## 2017-06-09 NOTE — Patient Outreach (Signed)
[place] [facility]) Care Management  06/09/2017  [name] [facility] [birth year] [id number]     EMMI- HF RED ON EMMI ALERT Day # 28 Date: 06/05/17 Red Alert Reason: "Weighed themselves today? No"    Outreach attempt #2 to patient. Spoke with patient. Reviewed red alert. Per patient she is consistently weighing and keeping track of weight at home. However, sometimes she gets automated call before she has had a chance to weigh as in this red alert case. RN CM advised patient to give the previous weight in these instances as to not flag red. She voiced understanding. Last recorded weight is 173.5 lbs. Patient with weight fluctuating about 1-2 pounds. She is aware of s/s of worsening condition and when to seek medical attention. She reports that her weight gain is more related to her ongoing GI issues and constipation. MD is aware and is working on multiple things to help with this problem. Otherwise, patient voices she is doing well. She is aware that she will continue to get automated post discharge EMMI-HF calls and will receive a call from a live nurse if nay of her responses are abnormal. No further RN CM needs or concerns voiced at this time. She was appreciative of follow up call.     Plan: RN CM will notify [facility] administrative assistant of case status.   Enzo Montgomery, RN,BSN,CCM [place] Management Telephonic Care Management Coordinator Direct Phone: [phone] Toll Free: [phone] Fax: [phone]

## 2017-06-10 ENCOUNTER — Other Ambulatory Visit: Payer: Self-pay

## 2017-06-10 MED ORDER — HYDRALAZINE HCL 25 MG PO TABS: 75.0000 mg | ORAL_TABLET | Freq: Three times a day (TID) | ORAL | 0 refills | Status: DC

## 2017-06-10 MED ORDER — ATORVASTATIN CALCIUM 80 MG PO TABS: 80.0000 mg | ORAL_TABLET | Freq: Every day | ORAL | 0 refills | Status: DC

## 2017-06-10 MED ORDER — ISOSORBIDE DINITRATE 10 MG PO TABS: 10.0000 mg | ORAL_TABLET | Freq: Three times a day (TID) | ORAL | 0 refills | Status: DC

## 2017-06-10 MED ORDER — CARVEDILOL 25 MG PO TABS: 25.0000 mg | ORAL_TABLET | Freq: Two times a day (BID) | ORAL | 0 refills | Status: DC

## 2017-06-10 NOTE — Telephone Encounter (Signed)
Patient is requesting for [name] to take over her prescriptions and have them sent to mail order. In que for your review.

## 2017-06-10 NOTE — Telephone Encounter (Signed)
Patient is requesting to get a two week supply locally while she waits for her mail order to go through.

## 2017-06-15 ENCOUNTER — Ambulatory Visit
Admission: RE | Admit: 2017-06-15 | Discharge: 2017-06-15 | Disposition: A | Discharge status: Home / Self Care | Payer: Medicare Other | Source: Ambulatory Visit | Attending: Adult Health | Admitting: Adult Health

## 2017-06-15 ENCOUNTER — Other Ambulatory Visit: Payer: Self-pay

## 2017-06-15 DIAGNOSIS — R140 Abdominal distension (gaseous): Secondary | ICD-10-CM

## 2017-06-15 DIAGNOSIS — K8020 Calculus of gallbladder without cholecystitis without obstruction: Secondary | ICD-10-CM | POA: Diagnosis not present

## 2017-06-15 NOTE — Patient Outreach (Signed)
[facility]) Care Management  06/15/2017  [name] [facility] 1945-07-18 [id number]  EMMI: heart failure red alert  Referral date: 06/15/17 Referral reason: new/ worsening symptoms: YES Day # 35  Telephone call to patient regarding EMM heart failure red alert. HIPAA verified with patient.  Patient states since she was having problems with her bowels since she had started a new diet. Stated she was only having bowel movements every 3 days or so and this was causing her some bloating. Patient states this has gotten better. Patient reports she did have a bowel movement today and states, " I feel great."  Patient states she has to take her medications, isorsobide and hydralazine  3 times a day. Patient states she occasionally misses her pm dose because she is usually asleep at that time. Patient states she has set up an alarm on her phone for a reminder but sometimes she is to tired to arouse enough to take the medication.  Patient states she is more of a morning person.  RNCM discussed with patient the possibility of adjusting her schedule and starting her morning medications a littler earlier which would allow her to take her pm dose of medication prior to bedtime.  Patient verbalized appreciation of the suggestion and states she may try to do it.   Patient denies any additional needs at this time. Verbalized appreciation of the telephone outreach.  RNCM confirmed patient has contact phone number for the 24 hour nurse call line. Patient verbalizes knowing how to contact her doctor after hours if needed.     PLAN: RNCM will refer patient to care management assistant to close due to patient being assessed and having no further needs.   Quinn Plowman RN,BSN,CCM [facility] Telephonic  [phone]

## 2017-06-22 ENCOUNTER — Ambulatory Visit: Payer: Medicare Other | Admitting: Podiatry

## 2017-07-08 ENCOUNTER — Other Ambulatory Visit: Payer: Self-pay

## 2017-07-08 MED ORDER — HYDRALAZINE HCL 25 MG PO TABS: 75.0000 mg | ORAL_TABLET | Freq: Three times a day (TID) | ORAL | 0 refills | Status: DC

## 2017-07-15 DIAGNOSIS — D631 Anemia in chronic kidney disease: Secondary | ICD-10-CM | POA: Diagnosis not present

## 2017-07-15 DIAGNOSIS — I5042 Chronic combined systolic (congestive) and diastolic (congestive) heart failure: Secondary | ICD-10-CM | POA: Diagnosis not present

## 2017-07-15 DIAGNOSIS — E1122 Type 2 diabetes mellitus with diabetic chronic kidney disease: Secondary | ICD-10-CM | POA: Diagnosis not present

## 2017-07-15 DIAGNOSIS — I120 Hypertensive chronic kidney disease with stage 5 chronic kidney disease or end stage renal disease: Secondary | ICD-10-CM | POA: Diagnosis not present

## 2017-07-15 DIAGNOSIS — N185 Chronic kidney disease, stage 5: Secondary | ICD-10-CM | POA: Diagnosis not present

## 2017-07-22 ENCOUNTER — Other Ambulatory Visit: Payer: Self-pay

## 2017-07-22 DIAGNOSIS — N185 Chronic kidney disease, stage 5: Secondary | ICD-10-CM

## 2017-07-31 ENCOUNTER — Other Ambulatory Visit: Payer: Self-pay | Admitting: Adult Health

## 2017-08-04 ENCOUNTER — Ambulatory Visit (INDEPENDENT_AMBULATORY_CARE_PROVIDER_SITE_OTHER): Payer: Medicare Other | Admitting: Internal Medicine

## 2017-08-04 VITALS — BP 154/66 | HR 76 | Temp 97.6°F | Resp 16 | Ht 65.0 in | Wt 170.6 lb

## 2017-08-04 DIAGNOSIS — Z79899 Other long term (current) drug therapy: Secondary | ICD-10-CM

## 2017-08-04 DIAGNOSIS — E782 Mixed hyperlipidemia: Secondary | ICD-10-CM | POA: Diagnosis not present

## 2017-08-04 DIAGNOSIS — I509 Heart failure, unspecified: Secondary | ICD-10-CM | POA: Diagnosis not present

## 2017-08-04 DIAGNOSIS — I2510 Atherosclerotic heart disease of native coronary artery without angina pectoris: Secondary | ICD-10-CM

## 2017-08-04 DIAGNOSIS — I10 Essential (primary) hypertension: Secondary | ICD-10-CM | POA: Diagnosis not present

## 2017-08-04 DIAGNOSIS — E1122 Type 2 diabetes mellitus with diabetic chronic kidney disease: Secondary | ICD-10-CM

## 2017-08-04 DIAGNOSIS — I700 Atherosclerosis of aorta: Secondary | ICD-10-CM | POA: Diagnosis not present

## 2017-08-04 DIAGNOSIS — Z136 Encounter for screening for cardiovascular disorders: Secondary | ICD-10-CM

## 2017-08-04 DIAGNOSIS — M109 Gout, unspecified: Secondary | ICD-10-CM

## 2017-08-04 DIAGNOSIS — N185 Chronic kidney disease, stage 5: Secondary | ICD-10-CM

## 2017-08-04 DIAGNOSIS — Z853 Personal history of malignant neoplasm of breast: Secondary | ICD-10-CM

## 2017-08-04 DIAGNOSIS — E1149 Type 2 diabetes mellitus with other diabetic neurological complication: Secondary | ICD-10-CM | POA: Diagnosis not present

## 2017-08-04 DIAGNOSIS — Z1212 Encounter for screening for malignant neoplasm of rectum: Secondary | ICD-10-CM

## 2017-08-04 DIAGNOSIS — Z86010 Personal history of colonic polyps: Secondary | ICD-10-CM

## 2017-08-04 DIAGNOSIS — E559 Vitamin D deficiency, unspecified: Secondary | ICD-10-CM | POA: Diagnosis not present

## 2017-08-04 DIAGNOSIS — Z8249 Family history of ischemic heart disease and other diseases of the circulatory system: Secondary | ICD-10-CM

## 2017-08-04 DIAGNOSIS — Z794 Long term (current) use of insulin: Secondary | ICD-10-CM

## 2017-08-04 DIAGNOSIS — Z1211 Encounter for screening for malignant neoplasm of colon: Secondary | ICD-10-CM

## 2017-08-04 DIAGNOSIS — E1165 Type 2 diabetes mellitus with hyperglycemia: Secondary | ICD-10-CM

## 2017-08-04 DIAGNOSIS — IMO0002 Reserved for concepts with insufficient information to code with codable children: Secondary | ICD-10-CM

## 2017-08-04 LAB — CBC WITH DIFFERENTIAL/PLATELET
BASOS PCT: 0.2 %
Basophils Absolute: 19 cells/uL (ref 0–200)
Eosinophils Absolute: 260 cells/uL (ref 15–500)
Eosinophils Relative: 2.8 %
HCT: 34.2 % — ABNORMAL LOW (ref 35.0–45.0)
HEMOGLOBIN: 11.5 g/dL — AB (ref 11.7–15.5)
Lymphs Abs: 1683 cells/uL (ref 850–3900)
MCH: 29.3 pg (ref 27.0–33.0)
MCHC: 33.6 g/dL (ref 32.0–36.0)
MCV: 87 fL (ref 80.0–100.0)
MONOS PCT: 9.1 %
MPV: 11.4 fL (ref 7.5–12.5)
NEUTROS ABS: 6491 {cells}/uL (ref 1500–7800)
Neutrophils Relative %: 69.8 %
PLATELETS: 252 10*3/uL (ref 140–400)
RBC: 3.93 10*6/uL (ref 3.80–5.10)
RDW: 14.4 % (ref 11.0–15.0)
TOTAL LYMPHOCYTE: 18.1 %
WBC: 9.3 10*3/uL (ref 3.8–10.8)
WBCMIX: 846 {cells}/uL (ref 200–950)

## 2017-08-04 NOTE — Patient Instructions (Signed)

## 2017-08-04 NOTE — Progress Notes (Signed)
[facility] Unk Pinto, M.D.     Uvaldo Bristle. Silverio Lay, P.A.-C Liane Comber, [place] [address] [place], [place] [address] Telephone [phone] Telefax [phone]  Comprehensive Evaluation &  Examination     This very nice 72 y.o. MWF presents for a comprehensive evaluation and management of multiple medical co-morbidities.  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 Patient also has hx/o Gout controlled with her Allopurinol.      HTN predates since the 1980's. In 2014, she had CABG and is followed by Dr Jenkins Rouge. Patient was hospitalized Jan 27-31, 2019 with acute on chronic combined systolic/diastolic CHF and since is followed closely at the Heart Failure clinic.  Patient's BP has been controlled at home and patient denies any cardiac symptoms as chest pain, palpitations, shortness of breath, dizziness or ankle swelling. Today's BP was elevated at 154/66 and confirmed on recheck.      Patient's hyperlipidemia is controlled with diet and medications. Patient denies myalgias or other medication SE's. Last lipids were at goal: Lab Results  Component Value Date   CHOL 120 05/11/2017   HDL 33 (L) 05/11/2017   LDLCALC 65 05/11/2017   TRIG 137 05/11/2017   CHOLHDL 3.6 05/11/2017      Patient has T2_IDDM since 2000 with CKD5  progressing over the last year & followed closely by Dr Madelon Lips. Tragically patients diabetic control has historically been poor with complications as above with she seems to have little insight to the severity of her condition or the consequences of her poor control. 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  Patient denies reactive hypoglycemic symptoms, visual blurring,  diabetic polys, but does have Diabetic Peripheral Neuropathy with numb & tingling paresthesias of her feet w/recumbancy.  paresthesias. Last A1c was not at goal: Lab Results  Component Value Date   HGBA1C 7.0 (H) 04/28/2017      Finally, patient has history of Vitamin D Deficiency("25"/2008, "63"1497 and "34"/2018) and she was advised to stop Vit D supplements by her Nephrologist due to elevated calciums.  Current Outpatient Medications on File Prior to Visit  Medication Sig  . allopurinol (ZYLOPRIM) 100 MG tablet TAKE 1 TABLET TWICE DAILY (Patient taking differently: Take 100 mg by mouth two times a day)  . anastrozole (ARIMIDEX) 1 MG tablet Take 1 tablet (1 mg total) by mouth daily.  Marland Kitchen aspirin EC 81 MG tablet Take 1 tablet (81 mg total) by mouth daily.  Marland Kitchen aspirin-acetaminophen-caffeine (EXCEDRIN MIGRAINE) 250-250-65 MG tablet Take 1 tablet by mouth daily as needed (for sinus headaches.).  Marland Kitchen atorvastatin (LIPITOR) 80 MG tablet Take 1 tablet (80 mg total) by mouth daily at 6 PM.  . beta carotene w/minerals (OCUVITE) tablet Take 1 tablet by mouth daily.  . carvedilol (COREG) 25 MG tablet Take 1 tablet (25 mg total) by mouth 2 (two) times daily with a meal.  . Cholecalciferol (VITAMIN D3) 2000 units TABS Take 2,000 Units by mouth daily.   Marland Kitchen docusate sodium (COLACE) 100 MG capsule Take 100 mg by mouth at bedtime.  . fluticasone (FLONASE) 50 MCG/ACT nasal spray Place 2 sprays into both nostrils daily as needed for allergies or rhinitis.  . hydrALAZINE (APRESOLINE) 25 MG tablet TAKE 3 TABLETS THREE  TIMES DAILY  . insulin NPH-regular Human (NOVOLIN 70/30) (70-30) 100 UNIT/ML injection Inject 30-65 Units into the skin See admin instructions. 60-65 units into the skin in the morning before breakfast & 30-35 units in the evening, depending on BGL  . isosorbide dinitrate (ISORDIL) 10 MG tablet Take 1 tablet (10 mg total) by mouth 3 (three) times daily.  Vladimir Faster Glycol-Propyl Glycol (LUBRICANT EYE  DROPS) 0.4-0.3 % SOLN Place 1-2 drops into both eyes 3 (three) times daily as needed (for dry eyes.).  Marland Kitchen furosemide (LASIX) 40 MG tablet Take 1 tablet (40 mg total) by mouth daily. (Patient taking differently: Take 80 mg by mouth daily. )  . NON FORMULARY Probation officer Health Tonic and Homeopathic Remedy: Mix 1 ounce with water and drink one to two times a day (5-6 times a week)   No current facility-administered medications on file prior to visit.    Allergies  Allergen Reactions  . Atenolol Other (See Comments)    Exacerbates gout  . Contrast Media [Iodinated Diagnostic Agents] Rash  . Latex Rash  . Omnipaque [Iohexol] Rash   Past Medical History:  Diagnosis Date  . Allergy   . Arthritis    "maybe in my lower back" (08/27/2012)  . 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 Chronic combined systolic and diastolic CHF (congestive heart failure) ([place])   . CKD (chronic kidney disease), stage IV ([place])   . CKD stage 3 due to type 2 diabetes mellitus ([place]) 05/08/2015  . Coronary artery disease    a. NSTEMI in 05/2009 s/p CABG (LIMA-LAD, SVG-diagonal, SVG-OM1/OM 2).   . Family history of anesthesia complication    Mom has a hard time to wake up  . Foot ulcer ([place])    "I've had them on both feet" (08/27/2012)  . Gouty arthritis    "right index finger" (08/27/2012)  . History of radiation therapy 06/09/13-07/06/13   left breast 50Gy  . Hyperlipidemia   . Hypertension   . Infection    right second toe  . Ischemic cardiomyopathy    a. 2011: EF 40-45%. b. EF 55-60% in 04/2017 but shortly after as inpatient was 45-50%.  Marland Kitchen LBBB (left bundle branch block)   . Mitral regurgitation   . Neuropathy    Hx; of B/L feet  . NSTEMI (non-ST elevated myocardial infarction) ([place]) 05/23/2009  . Osteomyelitis of foot ([place])   . PAD (peripheral artery disease) (HCC)    a. LE PAD (patient previously elected hold off L fem-pop), prev followed by Dr. Bridgett Larsson  . PONV  (postoperative nausea and vomiting)   . Sinus headache    "occasionally" (08/27/2012)  . Type II diabetes mellitus ([place])   . Vitamin D deficiency    Health Maintenance  Topic Date Due  . OPHTHALMOLOGY EXAM  10/18/2016  . FOOT EXAM  07/01/2017  . Hepatitis C Screening  04/07/2018 (Originally [birth year])  . HEMOGLOBIN A1C  10/26/2017  . INFLUENZA VACCINE  11/05/2017  . URINE MICROALBUMIN  01/22/2018  . MAMMOGRAM  12/26/2018  . COLONOSCOPY  03/08/2020  . TETANUS/TDAP  01/12/2022  . DEXA SCAN  Completed  . PNA vac Low Risk Adult  Completed   Immunization History  Administered Date(s) Administered  . Influenza, High Dose Seasonal PF 01/30/2014, 02/14/2015, 12/17/2015, 01/22/2017  . Influenza-Unspecified 02/05/2013  . Pneumococcal Conjugate-13 01/30/2014  . Pneumococcal Polysaccharide-23 01/07/2011  . Td 01/13/2012  . Zoster 01/13/2012   Last Colon - 12.05/2009 - Dr Deatra Ina  recc 5 yr f/u - which patient is deferring   Last MGM - 12/25/2016  Past Surgical History:  Procedure Laterality Date  . AMPUTATION Right 08/27/2012   Procedure: AMPUTATION RAY;  Surgeon: Newt Minion, MD;  Location: [facility];  Service: Orthopedics;  Laterality: Right;  Right Foot 5th Ray Amputation  . AMPUTATION Left 07/08/2013   Procedure: AMPUTATION RAY;  Surgeon: Newt Minion, MD;  Location: [facility];  Service: Orthopedics;  Laterality: Left;  Left Foot 2nd Ray Amputation  . AMPUTATION RAY Right 08/27/2012   5th ray/notes 08/27/2012  . AMPUTATION TOE Right 03/06/2017   Procedure: AMPUTATION TOE, INTERPHANGEAL 2ND RIGHT;  Surgeon: [name], DPM;  Location: [facility];  Service: Podiatry;  Laterality: Right;  . BREAST LUMPECTOMY  04/20/2013   with biopsy      DR WAKEFIELD  . BREAST LUMPECTOMY WITH NEEDLE LOCALIZATION AND AXILLARY SENTINEL LYMPH NODE BX Left 04/20/2013   Procedure: LEFT BREAST WIRE GUIDED LUMPECTOMY AND AXILLARY SENTINEL LYMPH NODE BX;  Surgeon: Rolm Bookbinder, MD;  Location: [facility];  Service:  General;  Laterality: Left;  . CARDIAC CATHETERIZATION  05/24/2009   Archie Endo 05/24/2009 (08/27/2012)  . CATARACT EXTRACTION W/ INTRAOCULAR LENS  IMPLANT, BILATERAL  2000's  . COLONOSCOPY W/ BIOPSIES AND POLYPECTOMY  2010  . CORONARY ARTERY BYPASS GRAFT  2011   "CABG X4" (08/27/2012)  . DENTAL SURGERY  04/30/11   "1 implant" (08/27/2012)  . DILATION AND CURETTAGE OF UTERUS    . EYE SURGERY    . FINGER SURGERY Right    "index finger; turned out to be gout" (08/27/2012)   Family History  Problem Relation Age of Onset  . Kidney cancer Brother 45  . Hypertension Brother   . Breast cancer Sister 25       LCIS; BRCA negative  . Throat cancer Father 41       smoker  . Breast cancer Sister 55       Lobular breast cancer  . Breast cancer Maternal Aunt        dx in her 110s   Social History   Tobacco Use  . Smoking status: Never Smoker  . Smokeless tobacco: Never Used  Substance Use Topics  . Alcohol use: Yes    Comment: rarely drinks wine  . Drug use: No    ROS Constitutional: Denies fever, chills, weight loss/gain, headaches, insomnia,  night sweats, and change in appetite. Does c/o fatigue. Eyes: Denies redness, blurred vision, diplopia, discharge, itchy, watery eyes.  ENT: Denies discharge, congestion, post nasal drip, epistaxis, sore throat, earache, hearing loss, dental pain, Tinnitus, Vertigo, Sinus pain, snoring.  Cardio: Denies chest pain, palpitations, irregular heartbeat, syncope, dyspnea, diaphoresis, orthopnea, PND, claudication, edema Respiratory: denies cough, dyspnea, DOE, pleurisy, hoarseness, laryngitis, wheezing.  Gastrointestinal: Denies dysphagia, heartburn, reflux, water brash, pain, cramps, nausea, vomiting, bloating, diarrhea, constipation, hematemesis, melena, hematochezia, jaundice, hemorrhoids Genitourinary: Denies dysuria, frequency, urgency, nocturia, hesitancy, discharge, hematuria, flank pain Breast: Breast lumps, nipple discharge, bleeding.  Musculoskeletal:  Denies arthralgia, myalgia, stiffness, Jt. Swelling, pain, limp, and strain/sprain. Denies falls. Skin: Denies puritis, rash, hives, warts, acne, eczema, changing in skin lesion Neuro: No weakness, tremor, incoordination, spasms, paresthesia, pain Psychiatric: Denies confusion, memory loss, sensory loss. Denies Depression. Endocrine: Denies change in weight, skin, hair change, nocturia, and paresthesia, diabetic polys, visual blurring, hyper / hypo glycemic episodes.  Heme/Lymph: No excessive bleeding, bruising, enlarged lymph nodes.  Physical Exam  BP (!) 154/66   Pulse 76   Temp  97.6 F (36.4 C)   Resp 16   Ht '5\' 5"'  (1.651 m)   Wt 170 lb 9.6 oz (77.4 kg)   BMI 28.39 kg/m   General Appearance: over nourished, well groomed and in no apparent distress.  Eyes: PERRLA, EOMs, conjunctiva no swelling or erythema, normal fundi and vessels. Sinuses: No frontal/maxillary tenderness ENT/Mouth: EACs patent / TMs  nl. Nares clear without erythema, swelling, mucoid exudates. Oral hygiene is good. No erythema, swelling, or exudate. Tongue normal, non-obstructing. Tonsils not swollen or erythematous. Hearing normal.  Neck: Supple, thyroid not palpable. No bruits, nodes or JVD. Respiratory: Respiratory effort normal.  BS equal and clear bilateral without rales, rhonci, wheezing or stridor. Cardio: Heart sounds are normal with regular rate and rhythm and no murmurs, rubs or gallops. Peripheral pulses are normal and equal bilaterally without edema. No aortic or femoral bruits. Chest: symmetric with normal excursions and percussion. Breasts: Symmetric, without lumps, nipple discharge, retractions, or fibrocystic changes.  Abdomen: Rotund, soft with bowel sounds active. Nontender, no guarding, rebound, hernias, masses, or organomegaly.  Lymphatics: Non tender without lymphadenopathy.  Musculoskeletal: Full ROM all peripheral extremities, joint stability, 5/5 strength, and normal gait. Skin: Warm and dry  without rashes, lesions, cyanosis, clubbing or  ecchymosis.  Neuro: Cranial nerves intact, reflexes equal bilaterally. Normal muscle tone, no cerebellar symptoms. Sensation intact.  Pysch: Alert and oriented X 3, normal affect, Insight and Judgment appropriate.   Assessment and Plan  1. Essential hypertension  - EKG 12-Lead - Urinalysis, Routine w reflex microscopic - Microalbumin / creatinine urine ratio - CBC with Differential/Platelet - COMPLETE METABOLIC PANEL WITH GFR - Magnesium - TSH  2. Hyperlipidemia, mixed  - EKG 12-Lead - COMPLETE METABOLIC PANEL WITH GFR - Lipid panel - TSH  3. Type 2 diabetes mellitus with stage 5 chronic kidney disease not on chronic dialysis, with long-term current use of insulin (HCC)  - EKG 12-Lead - Urinalysis, Routine w reflex microscopic - Microalbumin / creatinine urine ratio - Hemoglobin A1c - Insulin, random  4. Vitamin D deficiency  - VITAMIN D 25 Hydroxyl  5. Congestive heart failure, unspecified HF chronicity, unspecified heart failure type ([place])  - EKG 12-Lead - Lipid panel  6. Type II diabetes mellitus with neurological manifestations, uncontrolled ([place])  - HM DIABETES FOOT EXAM - LOW EXTREMITY NEUR EXAM DOCUM  7. History of breast cancer   8. Atherosclerosis of native coronary artery of native heart without angina pectoris  - EKG 12-Lead  9. FH: hypertension  - EKG 12-Lead  10. Aortic atherosclerosis (HCC)  - EKG 12-Lead  11. Gouty arthritis  - Uric acid  12. Screening for ischemic heart disease  - EKG 12-Lead - Lipid panel  13. Encounter for colorectal cancer screening  - POC Hemoccult Bld/Stl - Ambulatory referral to Gastroenterology  14. Hx of adenomatous colonic polyps  - Ambulatory referral to Gastroenterology  15. Medication management  - Urinalysis, Routine w reflex microscopic - Microalbumin / creatinine urine ratio - Uric acid - CBC with Differential/Platelet - COMPLETE METABOLIC  PANEL WITH GFR - Magnesium - Lipid panel - TSH - Hemoglobin A1c - Insulin, random - VITAMIN D 25 Hydroxyl          Patient was counseled in prudent diet to achieve/maintain BMI less than 25 for weight control, BP monitoring, regular exercise and medications. Discussed med's effects and SE's. Screening labs and tests as requested with regular follow-up as recommended. Over 40 minutes of exam, counseling, chart review and high complex  critical decision making was performed.

## 2017-08-05 ENCOUNTER — Encounter: Payer: Self-pay | Admitting: Internal Medicine

## 2017-08-05 LAB — LIPID PANEL
Cholesterol: 147 mg/dL (ref <200)
HDL: 29 mg/dL — ABNORMAL LOW (ref >50)
LDL Cholesterol (Calc): 89 mg/dL (calc)
NON-HDL CHOLESTEROL (CALC): 118 mg/dL (ref <130)
Total CHOL/HDL Ratio: 5.1 (calc) — ABNORMAL HIGH (ref <5.0)
Triglycerides: 196 mg/dL — ABNORMAL HIGH (ref <150)

## 2017-08-05 LAB — MICROALBUMIN / CREATININE URINE RATIO
Creatinine, Urine: 40 mg/dL (ref 20–275)
MICROALB UR: 118.3 mg/dL
Microalb Creat Ratio: 2958 mcg/mg creat — ABNORMAL HIGH (ref <30)

## 2017-08-05 LAB — COMPLETE METABOLIC PANEL WITH GFR
AG Ratio: 1.5 (calc) (ref 1.0–2.5)
ALBUMIN MSPROF: 4.3 g/dL (ref 3.6–5.1)
ALKALINE PHOSPHATASE (APISO): 105 U/L (ref 33–130)
ALT: 14 U/L (ref 6–29)
AST: 17 U/L (ref 10–35)
BUN / CREAT RATIO: 18 (calc) (ref 6–22)
BUN: 77 mg/dL — ABNORMAL HIGH (ref 7–25)
CALCIUM: 10 mg/dL (ref 8.6–10.4)
CO2: 30 mmol/L (ref 20–32)
CREATININE: 4.37 mg/dL — AB (ref 0.60–0.93)
Chloride: 101 mmol/L (ref 98–110)
GFR, Est African American: 11 mL/min/{1.73_m2} — ABNORMAL LOW (ref >=60)
GFR, Est Non African American: 10 mL/min/{1.73_m2} — ABNORMAL LOW (ref >=60)
GLOBULIN: 2.8 g/dL (ref 1.9–3.7)
Glucose, Bld: 60 mg/dL — ABNORMAL LOW (ref 65–99)
Potassium: 3.7 mmol/L (ref 3.5–5.3)
Sodium: 144 mmol/L (ref 135–146)
Total Bilirubin: 0.5 mg/dL (ref 0.2–1.2)
Total Protein: 7.1 g/dL (ref 6.1–8.1)

## 2017-08-05 LAB — URINALYSIS, ROUTINE W REFLEX MICROSCOPIC
BACTERIA UA: NONE SEEN /HPF
BILIRUBIN URINE: NEGATIVE
GLUCOSE, UA: NEGATIVE
HGB URINE DIPSTICK: NEGATIVE
Hyaline Cast: NONE SEEN /LPF
KETONES UR: NEGATIVE
LEUKOCYTES UA: NEGATIVE
NITRITE: NEGATIVE
PH: 6.5 (ref 5.0–8.0)
RBC / HPF: NONE SEEN /HPF (ref 0–2)
SPECIFIC GRAVITY, URINE: 1.011 (ref 1.001–1.03)
Squamous Epithelial / LPF: NONE SEEN /HPF (ref <=5)
WBC UA: NONE SEEN /HPF (ref 0–5)

## 2017-08-05 LAB — TSH: TSH: 2.43 m[IU]/L (ref 0.40–4.50)

## 2017-08-05 LAB — URIC ACID: Uric Acid, Serum: 6.2 mg/dL (ref 2.5–7.0)

## 2017-08-05 LAB — MAGNESIUM: MAGNESIUM: 2.7 mg/dL — AB (ref 1.5–2.5)

## 2017-08-05 LAB — HEMOGLOBIN A1C
EAG (MMOL/L): 8.7 (calc)
Hgb A1c MFr Bld: 7.1 % of total Hgb — ABNORMAL HIGH (ref <5.7)
MEAN PLASMA GLUCOSE: 157 (calc)

## 2017-08-05 LAB — VITAMIN D 25 HYDROXY (VIT D DEFICIENCY, FRACTURES): Vit D, 25-Hydroxy: 48 ng/mL (ref 30–100)

## 2017-08-05 LAB — INSULIN, RANDOM: Insulin: 78.7 u[IU]/mL — ABNORMAL HIGH (ref 2.0–19.6)

## 2017-08-17 ENCOUNTER — Other Ambulatory Visit: Payer: Self-pay | Admitting: Adult Health

## 2017-08-21 ENCOUNTER — Ambulatory Visit: Payer: Medicare Other | Admitting: Cardiovascular Disease

## 2017-08-25 ENCOUNTER — Ambulatory Visit (HOSPITAL_COMMUNITY)
Admission: RE | Admit: 2017-08-25 | Discharge: 2017-08-25 | Disposition: A | Discharge status: Home / Self Care | Payer: Medicare Other | Source: Ambulatory Visit | Attending: Internal Medicine | Admitting: Internal Medicine

## 2017-08-25 DIAGNOSIS — N185 Chronic kidney disease, stage 5: Secondary | ICD-10-CM

## 2017-08-26 ENCOUNTER — Encounter: Payer: Self-pay | Admitting: Vascular Surgery

## 2017-08-26 ENCOUNTER — Ambulatory Visit (INDEPENDENT_AMBULATORY_CARE_PROVIDER_SITE_OTHER): Payer: Medicare Other | Admitting: Vascular Surgery

## 2017-08-26 ENCOUNTER — Other Ambulatory Visit: Payer: Self-pay

## 2017-08-26 VITALS — BP 142/60 | HR 71 | Resp 18 | Ht 65.0 in | Wt 174.5 lb

## 2017-08-26 DIAGNOSIS — I2510 Atherosclerotic heart disease of native coronary artery without angina pectoris: Secondary | ICD-10-CM | POA: Diagnosis not present

## 2017-08-26 DIAGNOSIS — N185 Chronic kidney disease, stage 5: Secondary | ICD-10-CM | POA: Diagnosis not present

## 2017-08-26 NOTE — Progress Notes (Addendum)
Requested by:  Unk Pinto, MD [address] [place], [place] [address]  Reason for consultation: New access   History of Present Illness   [name] is a 72 y.o. (Jan 03, 1946) female who presents for evaluation for permanent access.  The patient is right hand dominant with left left lumpectomy with SLN.  The patient has not had previous access procedures.  Previous central venous cannulation procedures include: none.  The patient has never had a PPM placed.   Past Medical History:  Diagnosis Date  . Allergy   . Arthritis    "maybe in my lower back" (08/27/2012)  . 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 Chronic combined systolic and diastolic CHF (congestive heart failure) ([facility])   . CKD (chronic kidney disease), stage IV ([facility])   . CKD stage 3 due to type 2 diabetes mellitus ([facility]) 05/08/2015  . Coronary artery disease    a. NSTEMI in 05/2009 s/p CABG (LIMA-LAD, SVG-diagonal, SVG-OM1/OM 2).   . Family history of anesthesia complication    Mom has a hard time to wake up  . Foot ulcer ([facility])    "I've had them on both feet" (08/27/2012)  . Gouty arthritis    "right index finger" (08/27/2012)  . History of radiation therapy 06/09/13-07/06/13   left breast 50Gy  . Hyperlipidemia   . Hypertension   . Infection    right second toe  . Ischemic cardiomyopathy    a. 2011: EF 40-45%. b. EF 55-60% in 04/2017 but shortly after as inpatient was 45-50%.  Marland Kitchen LBBB (left bundle branch block)   . Mitral regurgitation   . Neuropathy    Hx; of B/L feet  . NSTEMI (non-ST elevated myocardial infarction) ([facility]) 05/23/2009  . Osteomyelitis of foot ([facility])   . PAD (peripheral artery disease) ([facility])    a. LE PAD (patient previously elected hold off L fem-pop), prev followed by Dr. Bridgett Larsson  . PONV (postoperative nausea and vomiting)   . Sinus headache    "occasionally" (08/27/2012)  . Type II diabetes mellitus ([facility])   . Vitamin D  deficiency     Past Surgical History:  Procedure Laterality Date  . AMPUTATION Right 08/27/2012   Procedure: AMPUTATION RAY;  Surgeon: Newt Minion, MD;  Location: [facility];  Service: Orthopedics;  Laterality: Right;  Right Foot 5th Ray Amputation  . AMPUTATION Left 07/08/2013   Procedure: AMPUTATION RAY;  Surgeon: Newt Minion, MD;  Location: [facility];  Service: Orthopedics;  Laterality: Left;  Left Foot 2nd Ray Amputation  . AMPUTATION RAY Right 08/27/2012   5th ray/notes 08/27/2012  . AMPUTATION TOE Right 03/06/2017   Procedure: AMPUTATION TOE, INTERPHANGEAL 2ND RIGHT;  Surgeon: [name], DPM;  Location: [facility];  Service: Podiatry;  Laterality: Right;  . BREAST LUMPECTOMY  04/20/2013   with biopsy      DR WAKEFIELD  . BREAST LUMPECTOMY WITH NEEDLE LOCALIZATION AND AXILLARY SENTINEL LYMPH NODE BX Left 04/20/2013   Procedure: LEFT BREAST WIRE GUIDED LUMPECTOMY AND AXILLARY SENTINEL LYMPH NODE BX;  Surgeon: Rolm Bookbinder, MD;  Location: [facility];  Service: General;  Laterality: Left;  . CARDIAC CATHETERIZATION  05/24/2009   Archie Endo 05/24/2009 (08/27/2012)  . CATARACT EXTRACTION W/ INTRAOCULAR LENS  IMPLANT, BILATERAL  2000's  . COLONOSCOPY W/ BIOPSIES AND POLYPECTOMY  2010  . CORONARY ARTERY BYPASS GRAFT  2011   "CABG X4" (08/27/2012)  . DENTAL SURGERY  04/30/11   "1  implant" (08/27/2012)  . DILATION AND CURETTAGE OF UTERUS    . EYE SURGERY    . FINGER SURGERY Right    "index finger; turned out to be gout" (08/27/2012)    Social History   Socioeconomic History  . Marital status: Married    Spouse name: [name]  . Number of children: 1  . Years of education: Not on file  . Highest education level: Not on file  Occupational History  . Not on file  Social Needs  . Financial resource strain: Not on file  . Food insecurity:    Worry: Not on file    Inability: Not on file  . Transportation needs:    Medical: Not on file    Non-medical: Not on file  Tobacco Use  . Smoking status: Never  Smoker  . Smokeless tobacco: Never Used  Substance and Sexual Activity  . Alcohol use: Yes    Comment: rarely drinks wine  . Drug use: No  . Sexual activity: Not Currently  Lifestyle  . Physical activity:    Days per week: Not on file    Minutes per session: Not on file  . Stress: Not on file  Relationships  . Social connections:    Talks on phone: Not on file    Gets together: Not on file    Attends religious service: Not on file    Active member of club or organization: Not on file    Attends meetings of clubs or organizations: Not on file    Relationship status: Not on file  . Intimate partner violence:    Fear of current or ex partner: Not on file    Emotionally abused: Not on file    Physically abused: Not on file    Forced sexual activity: Not on file  Other Topics Concern  . Not on file  Social History Narrative  . Not on file    Family History  Problem Relation Age of Onset  . Kidney cancer Brother 13  . Hypertension Brother   . Breast cancer Sister 49       LCIS; BRCA negative  . Throat cancer Father 7       smoker  . Breast cancer Sister 66       Lobular breast cancer  . Breast cancer Maternal Aunt        dx in her 11s    Current Outpatient Medications  Medication Sig Dispense Refill  . allopurinol (ZYLOPRIM) 100 MG tablet TAKE 1 TABLET TWICE DAILY (Patient taking differently: Take 100 mg by mouth two times a day) 180 tablet 1  . anastrozole (ARIMIDEX) 1 MG tablet Take 1 tablet (1 mg total) by mouth daily. 90 tablet 3  . aspirin EC 81 MG tablet Take 1 tablet (81 mg total) by mouth daily.    Marland Kitchen aspirin-acetaminophen-caffeine (EXCEDRIN MIGRAINE) 250-250-65 MG tablet Take 1 tablet by mouth daily as needed (for sinus headaches.).    Marland Kitchen atorvastatin (LIPITOR) 80 MG tablet TAKE 1 TABLET DAILY AT 6 PM. 90 tablet 0  . beta carotene w/minerals (OCUVITE) tablet Take 1 tablet by mouth daily.    . carvedilol (COREG) 25 MG tablet TAKE 1 TABLET TWICE DAILY WITH MEALS  180 tablet 0  . Cholecalciferol (VITAMIN D3) 2000 units TABS Take 6,000 Units by mouth daily.     Marland Kitchen docusate sodium (COLACE) 100 MG capsule Take 100 mg by mouth at bedtime.    . fluticasone (FLONASE) 50 MCG/ACT nasal spray Place 2  sprays into both nostrils daily as needed for allergies or rhinitis.    . hydrALAZINE (APRESOLINE) 25 MG tablet TAKE 3 TABLETS THREE TIMES DAILY 270 tablet 1  . insulin NPH-regular Human (NOVOLIN 70/30) (70-30) 100 UNIT/ML injection Inject 30-65 Units into the skin See admin instructions. 60-65 units into the skin in the morning before breakfast & 30-35 units in the evening, depending on BGL    . isosorbide dinitrate (ISORDIL) 10 MG tablet TAKE 1 TABLET THREE TIMES DAILY 270 tablet 0  . NON FORMULARY [employer] Tonic and Homeopathic Remedy: Mix 1 ounce with water and drink one to two times a day (5-6 times a week)    . Polyethyl Glycol-Propyl Glycol (LUBRICANT EYE DROPS) 0.4-0.3 % SOLN Place 1-2 drops into both eyes 3 (three) times daily as needed (for dry eyes.).    Marland Kitchen furosemide (LASIX) 40 MG tablet Take 1 tablet (40 mg total) by mouth daily. (Patient taking differently: Take 120 mg by mouth daily. ) 90 tablet 3   No current facility-administered medications for this visit.     Allergies  Allergen Reactions  . Atenolol Other (See Comments)    Exacerbates gout  . Contrast Media [Iodinated Diagnostic Agents] Rash  . Latex Rash  . Omnipaque [Iohexol] Rash    REVIEW OF SYSTEMS (negative unless checked):   Cardiac:  _0  Chest pain or chest pressure? _1  Shortness of breath upon activity? _2  Shortness of breath when lying flat? _3  Irregular heart rhythm?  Vascular:  _4  Pain in calf, thigh, or hip brought on by walking? _5  Pain in feet at night that wakes you up from your sleep? _6  Blood clot in your veins? _7  Leg swelling?  Pulmonary:  _8  Oxygen at home? _9  Productive cough? _10  Wheezing?  Neurologic:  _11  Sudden weakness in arms or legs? _12  Sudden  numbness in arms or legs? _13  Sudden onset of difficult speaking or slurred speech? _14  Temporary loss of vision in one eye? _15  Problems with dizziness?  Gastrointestinal:  _16  Blood in stool? _17  Vomited blood?  Genitourinary:  _18  Burning when urinating? _19  Blood in urine?  Psychiatric:  _20  Major depression  Hematologic:  _21  Bleeding problems? _22  Problems with blood clotting?  Dermatologic:  _23  Rashes or ulcers?  Constitutional:  _24  Fever or chills?  Ear/Nose/Throat:  _25  Change in hearing? _26  Nose bleeds? _27  Sore throat?  Musculoskeletal:  _28  Back pain? _29  Joint pain? _30  Muscle pain?   Physical Examination     Vitals:   08/26/17 0936  BP: (!) 142/60  Pulse: 71  Resp: 18  SpO2: 99%  Weight: 174 lb 8 oz (79.2 kg)  Height: _31  (1.651 m)   Body mass index is 29.04 kg/m.  General Alert, O x 3, WD, NAD  Head [place]/AT,    Ear/Nose/ Throat Hearing grossly intact, nares without erythema or drainage, oropharynx without Erythema or Exudate, Mallampati score: 3,   Eyes PERRLA, EOMI,    Neck Supple, mid-line trachea,    Pulmonary Sym exp, good B air movt, CTA B  Cardiac RRR, Nl S1, S2, no Murmurs, No rubs, No S3,S4  Vascular Vessel Right Left  Radial Palpable Palpable  Brachial Palpable Palpable  Carotid Palpable, No Bruit Palpable, No Bruit  Aorta Not palpable N/A  Femoral Palpable Palpable  Popliteal Not palpable Not palpable  PT Faintly palpable Faintly palpable  DP Faintly palpable Faintly palpable    Gastro- intestinal soft, non-distended, non-tender to palpation, No guarding or rebound, no HSM, no  masses, no CVAT B, No palpable prominent aortic pulse,    Musculo- skeletal M/S 5/5 throughout  , Both feet with socks on per patient's preference, visible cephalic vein in both arms with palpable radial pulses  Neurologic Cranial nerves 2-12 intact , Pain and light touch intact in extremities , Motor exam as listed above  Psychiatric Judgement intact, Mood &  affect appropriate for pt's clinical situation  Dermatologic See M/S exam for extremity exam, No rashes otherwise noted  Lymphatic  Palpable lymph nodes: None     Non-invasive Vascular Imaging   BUE Vein Mapping  (Date: 08/26/2017):   R: adeq cephalic vein throughout, basilic vein adequate  L: adeq cephalic vein throughout  BUE Doppler (Date: 08/26/2017):   R: brachial adeq, radial marginal  L: brachial adeq  Outside Studies/Documentation   10 pages of outside documents were reviewed including: outpatient renal chart.   Medical Decision Making   BRENAE LASECKI is a 72 y.o. female who presents with chronic kidney disease stage IV-V   Pt did not have a full ax dissection, so I suspect use of the left arm would be ok, but patient prefers to avoid such.  L arm venogram would verify patency of venous structures.   Based on exam and vein mapping this patient's permanent access options include: R RC vs BC AVF, maybe L RC vs BC AVF, R staged BVT  I would first proceed with right RC vs BC AVF .  I had an extensive discussion with this patient in regards to the nature of access surgery, including risk, benefits, and alternatives.    The patient is aware that the risks of access surgery include but are not limited to: bleeding, infection, steal syndrome, nerve damage, ischemic monomelic neuropathy, failure of access to mature, complications related to venous hypertension, and possible need for additional access procedures in the future.  The patient has NOT agreed to proceed with the above procedure.  She is going take the ESRD-HD classes and call us once she is ready to proceed.   Adele Barthel, MD, FACS [facility] Office: [phone] Pager: [phone]  08/26/2017, 10:00 AM

## 2017-08-27 ENCOUNTER — Encounter: Payer: Self-pay | Admitting: Nephrology

## 2017-09-10 DIAGNOSIS — N185 Chronic kidney disease, stage 5: Secondary | ICD-10-CM | POA: Diagnosis not present

## 2017-09-10 DIAGNOSIS — E1122 Type 2 diabetes mellitus with diabetic chronic kidney disease: Secondary | ICD-10-CM | POA: Diagnosis not present

## 2017-09-10 DIAGNOSIS — I120 Hypertensive chronic kidney disease with stage 5 chronic kidney disease or end stage renal disease: Secondary | ICD-10-CM | POA: Diagnosis not present

## 2017-09-10 DIAGNOSIS — N189 Chronic kidney disease, unspecified: Secondary | ICD-10-CM | POA: Diagnosis not present

## 2017-09-10 DIAGNOSIS — N2581 Secondary hyperparathyroidism of renal origin: Secondary | ICD-10-CM | POA: Diagnosis not present

## 2017-09-10 DIAGNOSIS — I5042 Chronic combined systolic (congestive) and diastolic (congestive) heart failure: Secondary | ICD-10-CM | POA: Diagnosis not present

## 2017-09-10 DIAGNOSIS — D631 Anemia in chronic kidney disease: Secondary | ICD-10-CM | POA: Diagnosis not present

## 2017-09-14 ENCOUNTER — Other Ambulatory Visit: Payer: Self-pay | Admitting: Internal Medicine

## 2017-09-25 ENCOUNTER — Other Ambulatory Visit: Payer: Self-pay | Admitting: *Deleted

## 2017-09-25 NOTE — Progress Notes (Signed)
Patient instructed to be at [facility] admitting department at 9 am on 09/29/17 for surgery. NPO past MN night prior and must have driver for home. Expect a call and follow the detailed instructions received from the hospital pre-admission department about this surgery, medications and insulin adjustment. Verbalized understanding.

## 2017-09-28 ENCOUNTER — Ambulatory Visit (INDEPENDENT_AMBULATORY_CARE_PROVIDER_SITE_OTHER): Payer: Medicare Other | Admitting: Podiatry

## 2017-09-28 ENCOUNTER — Encounter: Payer: Self-pay | Admitting: Podiatry

## 2017-09-28 ENCOUNTER — Ambulatory Visit (INDEPENDENT_AMBULATORY_CARE_PROVIDER_SITE_OTHER): Payer: Medicare Other

## 2017-09-28 ENCOUNTER — Telehealth: Payer: Self-pay | Admitting: Vascular Surgery

## 2017-09-28 ENCOUNTER — Telehealth: Payer: Self-pay | Admitting: Podiatry

## 2017-09-28 VITALS — Temp 98.8°F

## 2017-09-28 DIAGNOSIS — I2510 Atherosclerotic heart disease of native coronary artery without angina pectoris: Secondary | ICD-10-CM | POA: Diagnosis not present

## 2017-09-28 DIAGNOSIS — L97521 Non-pressure chronic ulcer of other part of left foot limited to breakdown of skin: Secondary | ICD-10-CM

## 2017-09-28 DIAGNOSIS — L84 Corns and callosities: Secondary | ICD-10-CM

## 2017-09-28 DIAGNOSIS — L97523 Non-pressure chronic ulcer of other part of left foot with necrosis of muscle: Secondary | ICD-10-CM

## 2017-09-28 DIAGNOSIS — E1149 Type 2 diabetes mellitus with other diabetic neurological complication: Secondary | ICD-10-CM

## 2017-09-28 MED ORDER — CLINDAMYCIN HCL 300 MG PO CAPS: 300.0000 mg | ORAL_CAPSULE | Freq: Three times a day (TID) | ORAL | 0 refills | Status: DC

## 2017-09-28 NOTE — Telephone Encounter (Signed)
Done. Val please make sure she got it. Thanks.

## 2017-09-28 NOTE — Telephone Encounter (Signed)
Pt . Called to find out about her medication that was suppose to be sent to [facility] on [address].

## 2017-09-28 NOTE — Telephone Encounter (Signed)
Patient called to cancel surgery with Dr. Bridgett Larsson scheduled for 09/29/2017.  She says she is trying to have a toe procedure with another MD.  She will call us back to reschedule.  Patient is aware procedure may have to be with another MD since Dr. Bridgett Larsson may not be available when she calls.    Thurston Hole., LPN

## 2017-09-29 ENCOUNTER — Ambulatory Visit (HOSPITAL_COMMUNITY): Admission: RE | Admit: 2017-09-29 | Payer: Medicare Other | Source: Ambulatory Visit | Admitting: Vascular Surgery

## 2017-09-29 ENCOUNTER — Encounter (HOSPITAL_COMMUNITY): Admission: RE | Payer: Self-pay | Source: Ambulatory Visit

## 2017-09-29 SURGERY — ARTERIOVENOUS (AV) FISTULA CREATION
Anesthesia: Monitor Anesthesia Care | Laterality: Right

## 2017-09-29 MED ORDER — MEDIHONEY WOUND/BURN DRESSING EX GEL: CUTANEOUS | 5 refills | Status: DC

## 2017-09-29 NOTE — Addendum Note (Signed)
Addended by: Harriett Sine D on: 09/29/2017 10:39 AM   Modules accepted: Orders

## 2017-09-29 NOTE — Telephone Encounter (Signed)
I informed pt the antibiotic was sent the [facility]. Pt states [name] had also wanted her to have Medihoney. I sent the rx to [facility] for [place].

## 2017-10-01 NOTE — Progress Notes (Signed)
Subjective: 72 year old female presents the office today for concerns of a wound to the bottom of her left second toe as well as a callus, spot on the bottom of her right arch.  She states that she had the wound on the left foot that healed however she continued to put a wrap on it.  She notes that the tape was causing the wound to the bottom aspect of the toe.  She cannot really see it so she is not sure how is doing but she denies any drainage or pus but she has noticed some redness and swelling to the toe.  Also the callus on the right foot is been doing well however she is concerned that she is noticed some blood in the area.  Denies any drainage of this area. Denies any systemic complaints such as fevers, chills, nausea, vomiting. No acute changes since last appointment, and no other complaints at this time.   Objective: AAO x3, NAD DP/PT pulses palpable bilaterally, CRT less than 3 seconds On the plantar aspect the left second toe there is an ulceration with exposed tendon.  It measures approximately 0.6 x 0.6 cm however it is difficult to evaluate the size of his right and the sulcus on the plantar aspect of the PIPJ and the toe is contracted.  There is no drainage or pus but there is mild edema and erythema to the toe.  There is no fluctuation or crepitation or any malodor.  Pre-ulcerative callus to the plantar medial aspect of the patient's left foot on the first MPJ.  Also hyperkeratotic tissue present on the right arch of the foot with evidence of dried blood.  Upon debridement there is no definitive open ulceration identified at this time but is pre-ulcerative No open lesions or pre-ulcerative lesions.  No pain with calf compression, swelling, warmth, erythema  Assessment: Ulceration left foot toe with exposed tendon  Plan: -All treatment options discussed with the patient including all alternatives, risks, complications.  -X-rays obtained reviewed the left foot.  Is no definitive cortical  destruction suggestive of osteomyelitis there is no soft tissue emphysema. -I debrided the pre-ulcerative calluses x2 without any complications or bleeding. -Regards to the wound in the left foot we are going to use MediHoney which I dispensed to her today.  Also continue offloading.  She has a surgical shoe at home and I want her to wear.  Prescribed clindamycin today. -Monitor for any clinical signs or symptoms of infection and directed to call the office immediately should any occur or go to the ER. -RTC 1 week or sooner if needed.  -Patient encouraged to call the office with any questions, concerns, change in symptoms.   [name] DPM

## 2017-10-05 ENCOUNTER — Encounter: Payer: Self-pay | Admitting: Podiatry

## 2017-10-05 ENCOUNTER — Ambulatory Visit (INDEPENDENT_AMBULATORY_CARE_PROVIDER_SITE_OTHER): Payer: Medicare Other | Admitting: Podiatry

## 2017-10-05 VITALS — BP 145/54 | HR 64 | Temp 98.7°F | Resp 15

## 2017-10-05 DIAGNOSIS — L97523 Non-pressure chronic ulcer of other part of left foot with necrosis of muscle: Secondary | ICD-10-CM | POA: Diagnosis not present

## 2017-10-05 NOTE — Progress Notes (Signed)
  Subjective:  Patient ID: [name], female    DOB: October 28, 1945,  MRN: [id number]  Chief Complaint  Patient presents with  . Foot Ulcer    F/U L toe ulcer Pt. stated," it feels and looks better." Tx: medihoney, clindamycin and saline water -Pt denies N/v/Ch/F   72 y.o. female returns for wound care. Believes the wound to be better. States it looks and feels better. Has been taking clindamycin as directed.  Using medihoney. Denies N/V/F/Ch.  Objective:   Vitals:   10/05/17 1705  BP: (!) 145/54  Pulse: 64  Resp: 15  Temp: 98.7 F (37.1 C)   General AA&O x3. Normal mood and affect.  Vascular Foot warm to touch.  Neurologic Sensation grossly diminished.  Dermatologic (Wound) Wound Location: L 3rd toe Wound Measurement: 1x0.5 deep to tendon. Wound Base: exposed tendon Peri-wound: Normal Exudate: Scant/small amount Serous exudate  Wound progress: No Change since last check. No warmth or erythema no ascending cellulitis no purulence no signs of local infection  Orthopedic: No pain to palpation either foot. History of left second toe amputation noted   Assessment & Plan:  Patient was evaluated and treated and all questions answered.  Ulcer L 3rd Toe -No debridement performed today.  Continue to exposed tendon.  Applied Prisma Ag for antimicrobial fracture and to promote coverage of the tendon.  Patient given Prisma and advised to apply it daily with dressing.  Monitor for signs of infection.  Continue clindamycin to completion.  Erythema appears to be resolved no acute signs of infection today.  Return in about 1 week (around 10/12/2017) for Wound Care.

## 2017-10-06 NOTE — Progress Notes (Signed)
CARDIOLOGY OFFICE NOTE  Date:  10/07/2017    [name] Date of Birth: [birth year] Medical Record #[id number]  PCP:  Pamela Pinto, MD  Cardiologist:  [name] & formerly [name]   Chief Complaint  Patient presents with  . Congestive Heart Failure  . Coronary Artery Disease  . Cardiac Valve Problem    Seen for Dr. Johnsie [name]    History of Present Illness: [name] is a 72 y.o. female who presents today for a follow up visit.  Seen here for Dr. Johnsie [name] (last seen in December of 2014 by Dr. Johnsie [name]).   She has a hx of CAD (NSTEMI 2011 s/p CABG per Dr. Cyndia [name]), chronic combined CHF/ICM, CKD stage IV ((baseline Cr around 2.7-3.1), HTN, HLD, DM, LE PAD (patient previously elected hold off L fem-pop), foot ulcer/infection, osteomyelitis of the left toe, LBBB, arthritis, breast CA s/p lumpectomy/radiation, & gout.   At time of MI/CABG in 2011, EF was 40-45%.In September she reports her nephrologist [name] discontinued her losartan and changed her to hydralazine due to declining renal function. She saw [name] 02/2018 for pre-op eval for toe amputation at which time she was reporting generally decreased stamina but no specific symptoms. 2D echo 1/7//19 showed EF 55-60%.   Admitted at the end of January with worsening SOB/DOE as well as edema of her lower extremities. Repeatecho 05/04/17 showed mild LVH, EF 45-50%, diffuse HK, grade 2 DD, calcified mitral annulus with moderate MR, mildly dilated LA, normal RV. Initial labs notable for Cr 2.9, troponin 0.07, BNP 784, leukocytosis WBC 12k, Hgb 11.9.Troponins low/flat, peak 0.08.CXR shows cardiomegaly with interstitial opacities bilaterally favored to represent edema; atypical infection could appear similar.She was diuresed with IV Lasix.Although weight only changed by 1 lb, she reported brisk UOP and was down 4L.Hydralazine and carvedilol were increased and isordil was added.With these measure she reports marked  improvement. She did admit to laxity with her diet/sodium restriction over the holidays.   Last seen here back in February - was doing ok. Weight was going down. Seemed to be on a better track with taking care of herself. I did get her Myoview updated - this was stable. I wanted to get her back to see Dr. Johnsie [name] for follow up. She is followed closely by nephrology.   Comes in today. Here alone. She was to follow up with Dr. Johnsie [name] from my last visit - this did not happen. I have notes from [name] with Nephrology - [name] was doing poorly in April - needed more diuretics - was better on visit with June. Fistula has been put off with [name] at this time due to an ulcer on her foot. She is in orthopedic boots. Her breathing is fair. She feels better on current dose of Lasix. She will be seeing someone at [name] to get her fistula arranged later this summer. She wishes she could be more active - she has a pool - but it is kidney shaped and has a very small 23ft area then a drop off of 8 foot. No chest pain. She is fatigued. Overall, she feels like she is holding her own.   Past Medical History:  Diagnosis Date  . Allergy   . Arthritis    "maybe in my lower back" (08/27/2012)  . 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 Chronic combined systolic and diastolic CHF (congestive heart failure) ([facility])   . CKD (chronic kidney disease), stage IV (  [facility])   . CKD stage 3 due to type 2 diabetes mellitus ([facility]) 05/08/2015  . Coronary artery disease    a. NSTEMI in 05/2009 s/p CABG (LIMA-LAD, SVG-diagonal, SVG-OM1/OM 2).   . Family history of anesthesia complication    Mom has a hard time to wake up  . Foot ulcer ([facility])    "I've had them on both feet" (08/27/2012)  . Gouty arthritis    "right index finger" (08/27/2012)  . History of radiation therapy 06/09/13-07/06/13   left breast 50Gy  . Hyperlipidemia   . Hypertension   . Infection    right second toe  . Ischemic  cardiomyopathy    a. 2011: EF 40-45%. b. EF 55-60% in 04/2017 but shortly after as inpatient was 45-50%.  Marland Kitchen LBBB (left bundle branch block)   . Mitral regurgitation   . Neuropathy    Hx; of B/L feet  . NSTEMI (non-ST elevated myocardial infarction) ([facility]) 05/23/2009  . Osteomyelitis of foot ([facility])   . PAD (peripheral artery disease) ([facility])    a. LE PAD (patient previously elected hold off L fem-pop), prev followed by Dr. Bridgett Larsson  . PONV (postoperative nausea and vomiting)   . Sinus headache    "occasionally" (08/27/2012)  . Type II diabetes mellitus ([facility])   . Vitamin D deficiency     Past Surgical History:  Procedure Laterality Date  . AMPUTATION Right 08/27/2012   Procedure: AMPUTATION RAY;  Surgeon: Newt Minion, MD;  Location: [facility];  Service: Orthopedics;  Laterality: Right;  Right Foot 5th Ray Amputation  . AMPUTATION Left 07/08/2013   Procedure: AMPUTATION RAY;  Surgeon: Newt Minion, MD;  Location: [facility];  Service: Orthopedics;  Laterality: Left;  Left Foot 2nd Ray Amputation  . AMPUTATION RAY Right 08/27/2012   5th ray/notes 08/27/2012  . AMPUTATION TOE Right 03/06/2017   Procedure: AMPUTATION TOE, INTERPHANGEAL 2ND RIGHT;  Surgeon: [name], DPM;  Location: [facility];  Service: Podiatry;  Laterality: Right;  . BREAST LUMPECTOMY  04/20/2013   with biopsy      DR WAKEFIELD  . BREAST LUMPECTOMY WITH NEEDLE LOCALIZATION AND AXILLARY SENTINEL LYMPH NODE BX Left 04/20/2013   Procedure: LEFT BREAST WIRE GUIDED LUMPECTOMY AND AXILLARY SENTINEL LYMPH NODE BX;  Surgeon: Rolm Bookbinder, MD;  Location: [facility];  Service: General;  Laterality: Left;  . CARDIAC CATHETERIZATION  05/24/2009   Archie Endo 05/24/2009 (08/27/2012)  . CATARACT EXTRACTION W/ INTRAOCULAR LENS  IMPLANT, BILATERAL  2000's  . COLONOSCOPY W/ BIOPSIES AND POLYPECTOMY  2010  . CORONARY ARTERY BYPASS GRAFT  2011   "CABG X4" (08/27/2012)  . DENTAL SURGERY  04/30/11   "1 implant" (08/27/2012)  . DILATION AND CURETTAGE OF UTERUS      . EYE SURGERY    . FINGER SURGERY Right    "index finger; turned out to be gout" (08/27/2012)     Medications: Current Meds  Medication Sig  . allopurinol (ZYLOPRIM) 100 MG tablet Take 100 mg by mouth 2 (two) times daily.  . furosemide (LASIX) 40 MG tablet Take 40 mg by mouth 2 (two) times daily. 2 tablets (80 mg ) am 1-2 tablets pm     Allergies: Allergies  Allergen Reactions  . Atenolol Other (See Comments)    Exacerbates gout  . Contrast Media [Iodinated Diagnostic Agents] Rash  . Latex Rash  . Omnipaque [Iohexol] Rash    Social History: The patient  reports that she has never smoked. She has never used smokeless tobacco. She  reports that she drinks alcohol. She reports that she does not use drugs.   Family History: The patient's family history includes Breast cancer in her maternal aunt; Breast cancer (age of onset: 3) in her sister; Breast cancer (age of onset: 42) in her sister; Hypertension in her brother; Kidney cancer (age of onset: 51) in her brother; Throat cancer (age of onset: 22) in her father.   Review of Systems: Please see the history of present illness.   Otherwise, the review of systems is positive for none.   All other systems are reviewed and negative.   Physical Exam: VS:  BP (!) 158/70 (BP Location: Right Arm, Patient Position: Sitting, Cuff Size: Normal)   Pulse 67   Ht 5\' 5"  (1.651 m)   Wt 178 lb (80.7 kg)   SpO2 99% Comment: at rest  BMI 29.62 kg/m  .  BMI Body mass index is 29.62 kg/m.  Wt Readings from Last 3 Encounters:  10/07/17 178 lb (80.7 kg)  08/26/17 174 lb 8 oz (79.2 kg)  08/04/17 170 lb 9.6 oz (77.4 kg)   BP by me is 148/60.  General: Pleasant. She is chronically ill appearing but alert and in no acute distress.   HEENT: Normal.  Neck: Supple, no JVD, carotid bruits, or masses noted.  Cardiac: Regular rate and rhythm. I do not appreciate much murmur. No edema today.  Respiratory:  Lungs are clear to auscultation bilaterally  with normal work of breathing.  GI: Soft and nontender.  MS: No deformity or atrophy. Gait and ROM intact. She is using a cane. She has orthopedic boots on bilaterally.  Skin: Warm and dry. Color is normal.  Neuro:  Strength and sensation are intact and no gross focal deficits noted.  Psych: Alert, appropriate and with normal affect.   LABORATORY DATA:  EKG:  EKG is not ordered today.  Lab Results  Component Value Date   WBC 9.3 08/04/2017   HGB 11.5 (L) 08/04/2017   HCT 34.2 (L) 08/04/2017   PLT 252 08/04/2017   GLUCOSE 60 (L) 08/04/2017   CHOL 147 08/04/2017   TRIG 196 (H) 08/04/2017   HDL 29 (L) 08/04/2017   LDLCALC 89 08/04/2017   ALT 14 08/04/2017   AST 17 08/04/2017   NA 144 08/04/2017   K 3.7 08/04/2017   CL 101 08/04/2017   CREATININE 4.37 (H) 08/04/2017   BUN 77 (H) 08/04/2017   CO2 30 08/04/2017   TSH 2.43 08/04/2017   INR 0.91 07/08/2013   HGBA1C 7.1 (H) 08/04/2017   MICROALBUR 118.3 08/04/2017     BNP (last 3 results) Recent Labs    05/03/17 1951  BNP 784.6*    ProBNP (last 3 results) No results for input(s): PROBNP in the last 8760 hours.   Other Studies Reviewed Today:  Myoview Study Highlights 05/2017    Nuclear stress EF: 44%.  There was no ST segment deviation noted during stress.  The study is normal.  This is a low risk study.  The left ventricular ejection fraction is mildly decreased (45-54%).   Normal pharmacologic nuclear stress test with no evidence for prior infarct or ischemia.  Mildly decreased LVEF calculated at 44%.      2D Echo 05/04/17 Study Conclusions  - Left ventricle: The cavity size was normal. Wall thickness was increased in a pattern of mild LVH. Systolic function was mildly reduced. The estimated ejection fraction was in the range of 45% to 50%. Diffuse hypokinesis. Features are consistent with  a pseudonormal left ventricular filling pattern, with concomitant abnormal relaxation and  increased filling pressure (grade 2 diastolic dysfunction). - Aortic valve: Poorly visualized. Probably trileaflet; mildly calcified leaflets. There was no stenosis. - Mitral valve: Severely calcified annulus. Moderately calcified leaflets . The findings are consistent with trivial stenosis. There was moderate regurgitation. Pressure half-time: 77 ms. Mean gradient (D): 6 mm Hg. - Left atrium: The atrium was mildly dilated. - Right ventricle: The cavity size was normal. Systolic function was normal. - Pulmonary arteries: PA peak pressure: 32 mm Hg (S). - Inferior vena cava: The vessel was normal in size. The respirophasic diameter changes were in the normal range (>= 50%), consistent with normal central venous pressure.  Impressions:  - Normal LV size with mild LV hypertrophy. EF 45-50%, diffuse hypokinesis. Normal RV size and systolic function. Heavily calcified mitral valve and annulus. There was moderate mitral regurgitation and probably only trivial mitral stenosis (mean gradient 6 mmHg but PHT 77 msec suggesting valve area in normal range).    Assessment/Plan:  1.Chronic combined systolic and diastolic CHF - It has been suspected that this has been exacerbated by excessive salt and poor BP control. Her worsening CKD is also a factor. MV disease may be a culprit as well as possible graft failure.   She has had her diuretics adjusted by Nephrology.   2. CAD with remote MI/CABG - multiple risk factors - her Myovew from earlier this year was felt to be low risk.   3. HLD - on high dose statin   4. Moderate MR noted on most recent echo - may need TEE at some point - no real murmur on exam. Will plan on repeating in January.   5. HTN - better control noted at home. For now, no change in her regimen.   6. CKD - followed closely by Nephrology. She is most likely nearing dialysis - she is hoping to hold off for as long as possible. Seeing [name]  later this summer after her foot ulcer heals to have fistula placed.   7. Generalized deconditioning  Current medicines are reviewed with the patient today.  The patient does not have concerns regarding medicines other than what has been noted above.  The following changes have been made:  See above.  Labs/ tests ordered today include:   No orders of the defined types were placed in this encounter.    Disposition:   FU with me in about 3 months. I will ask Dr. Marlou Porch if he will co-manage with me going forward.   Patient is agreeable to this plan and will call if any problems develop in the interim.   SignedTruitt Merle, NP  10/07/2017 4:10 PM  [facility] [address] [place], [place]  [address] Phone: [phone] Fax: [phone]

## 2017-10-07 ENCOUNTER — Ambulatory Visit (INDEPENDENT_AMBULATORY_CARE_PROVIDER_SITE_OTHER): Payer: Medicare Other | Admitting: Nurse Practitioner

## 2017-10-07 ENCOUNTER — Encounter: Payer: Self-pay | Admitting: Nurse Practitioner

## 2017-10-07 VITALS — BP 158/70 | HR 67 | Ht 65.0 in | Wt 178.0 lb

## 2017-10-07 DIAGNOSIS — I5022 Chronic systolic (congestive) heart failure: Secondary | ICD-10-CM

## 2017-10-07 DIAGNOSIS — I2510 Atherosclerotic heart disease of native coronary artery without angina pectoris: Secondary | ICD-10-CM | POA: Diagnosis not present

## 2017-10-07 DIAGNOSIS — I10 Essential (primary) hypertension: Secondary | ICD-10-CM

## 2017-10-07 DIAGNOSIS — I340 Nonrheumatic mitral (valve) insufficiency: Secondary | ICD-10-CM

## 2017-10-07 NOTE — Patient Instructions (Addendum)
We will be checking the following labs today - NONE   Medication Instructions:    Continue with your current medicines.     Testing/Procedures To Be Arranged:  N/A  Follow-Up:   See me in 3 months    Other Special Instructions:   N/A    If you need a refill on your cardiac medications before your next appointment, please call your pharmacy.   Call the [facility] office at [phone] if you have any questions, problems or concerns.

## 2017-10-12 ENCOUNTER — Telehealth: Payer: Self-pay | Admitting: Vascular Surgery

## 2017-10-12 NOTE — Telephone Encounter (Signed)
Pt only wants to see Dr. Carlis Abbott for her new HD access. Put in 2 m recall for Dr. Bridgett Larsson as all of Dr. Ainsley Spinner pts will be taken from [facility] recalls. Spoke to the pt to inform them that we can not sch their appt yet and it will probably be at the end of Aug when Dr. Ainsley Spinner sch opens up.

## 2017-10-13 ENCOUNTER — Encounter: Payer: Self-pay | Admitting: Podiatry

## 2017-10-13 ENCOUNTER — Ambulatory Visit (INDEPENDENT_AMBULATORY_CARE_PROVIDER_SITE_OTHER): Payer: Medicare Other | Admitting: Podiatry

## 2017-10-13 VITALS — BP 140/59 | HR 74 | Temp 97.7°F | Resp 18

## 2017-10-13 DIAGNOSIS — E1149 Type 2 diabetes mellitus with other diabetic neurological complication: Secondary | ICD-10-CM | POA: Diagnosis not present

## 2017-10-13 DIAGNOSIS — L97523 Non-pressure chronic ulcer of other part of left foot with necrosis of muscle: Secondary | ICD-10-CM | POA: Diagnosis not present

## 2017-10-14 ENCOUNTER — Encounter: Payer: Self-pay | Admitting: Internal Medicine

## 2017-10-14 NOTE — Progress Notes (Signed)
Subjective: 72 year old female presents the office today for follow-up evaluation of a wound to the bottom of her left third toe.  She states that it is hard for her to see if she is not sure how is doing.  She denies any drainage or pus in the area she denies any increasing redness or swelling.  She is been using Prisma Ag to the wound daily.  Denies any systemic complaints such as fevers, chills, nausea, vomiting. No acute changes since last appointment, and no other complaints at this time.   Objective: AAO x3, NAD DP/PT pulses palpable bilaterally, CRT less than 3 seconds Hammertoe deformities are present.  There is still ulceration present to the plantar aspect the left third toe which measures about the same size that it did last appointment.  Measures 1 x 0.5 cm and there is exposed tendon.  Small amount of clear drainage expressed but there is no purulence.  Minimal swelling redness of the toe however this faint erythema.  There is no ascending sialitis there is no fluctuation or crepitation or any malodor. No open lesions or pre-ulcerative lesions.  No pain with calf compression, swelling, warmth, erythema  Assessment: Ulceration left third toe  Plan: -All treatment options discussed with the patient including all alternatives, risks, complications.  -I did debride the wound to healthy tissue.  The tendon I think is was not having this from healing I discussed with her cutting the tendon today as it is exposed to the wound to help relax the toe.  I discussed with her the risks of doing this including that she is high risk for irritation whether we do this or not.  In order to help save the toe after verbal consent was obtained I did is a 15 blade scalpel to cut the tendon that was exposed.  After this the toe did relax.  Area was copiously irrigated with saline.  Betadine was applied followed by dressing.  She is start with daily dressing changes.  She has collagen alginate dressing at home I  want her to use.  Continue surgical shoe and offloading.  She finished her course of antibiotics recently went off on further antibiotics at this time.  There is any signs or symptoms of worsening infection to call the office and we should any occur.  Otherwise I will see her back in 1 week for recheck or sooner if needed. -Patient encouraged to call the office with any questions, concerns, change in symptoms.   [name] DPM

## 2017-10-20 ENCOUNTER — Encounter: Payer: Self-pay | Admitting: Podiatry

## 2017-10-20 ENCOUNTER — Ambulatory Visit (INDEPENDENT_AMBULATORY_CARE_PROVIDER_SITE_OTHER): Payer: Medicare Other | Admitting: Podiatry

## 2017-10-20 DIAGNOSIS — L97523 Non-pressure chronic ulcer of other part of left foot with necrosis of muscle: Secondary | ICD-10-CM

## 2017-10-26 ENCOUNTER — Other Ambulatory Visit: Payer: Self-pay | Admitting: Physician Assistant

## 2017-10-27 NOTE — Progress Notes (Signed)
Subjective: 72 year old female presents the office for follow-up evaluation of wound on the left third toe.  She states that she thinks the wound is doing better but she cannot see it so it is hard to tell.  She denies any increase in swelling or pus coming from the area and she denies any redness to her foot.  She can continue with daily dressing changes with the Collagen alginate. Denies any systemic complaints such as fevers, chills, nausea, vomiting. No acute changes since last appointment, and no other complaints at this time.   Objective: AAO x3, NAD DP/PT pulses palpable bilaterally, CRT less than 3 seconds Telephone aspect the left third toe on the PIPJ on the sulcus the wound appears to be more superficial today.  Does measure about the same as before 1 x 0.5 cm however it is starting to granulate in.  There is no pus identified to the area today there is only small amount of bloody drainage.  Mild swelling to the toe but there is no significant erythema or increase in warmth there is no ascending sialitis.  No other open lesions identified. No open lesions or pre-ulcerative lesions.  No pain with calf compression, swelling, warmth, erythema  Assessment: Ulceration left third toe  Plan: -All treatment options discussed with the patient including all alternatives, risks, complications.  -Today I did utilize a #312 blade scalpel after the area was cleaned with alcohol to debride the wound to healthy, granular tissue.  All her to continue daily dressing changes as we discussed.  Continue offloading at all times.  Continue with surgical shoe. -Monitor for any clinical signs or symptoms of infection and directed to call the office immediately should any occur or go to the ER. -Patient encouraged to call the office with any questions, concerns, change in symptoms.   [name] DPM

## 2017-10-30 ENCOUNTER — Ambulatory Visit (INDEPENDENT_AMBULATORY_CARE_PROVIDER_SITE_OTHER): Payer: Medicare Other | Admitting: Podiatry

## 2017-10-30 DIAGNOSIS — L97523 Non-pressure chronic ulcer of other part of left foot with necrosis of muscle: Secondary | ICD-10-CM

## 2017-10-30 DIAGNOSIS — E1149 Type 2 diabetes mellitus with other diabetic neurological complication: Secondary | ICD-10-CM

## 2017-11-03 ENCOUNTER — Encounter (HOSPITAL_COMMUNITY): Payer: Self-pay

## 2017-11-03 ENCOUNTER — Inpatient Hospital Stay (HOSPITAL_COMMUNITY): Payer: Medicare Other

## 2017-11-03 ENCOUNTER — Emergency Department (HOSPITAL_COMMUNITY): Payer: Medicare Other

## 2017-11-03 ENCOUNTER — Inpatient Hospital Stay (HOSPITAL_COMMUNITY)
Admission: EM | Admit: 2017-11-03 | Discharge: 2017-11-05 | DRG: 638 | Disposition: A | Discharge status: Home / Self Care | Payer: Medicare Other | Attending: Internal Medicine | Admitting: Internal Medicine

## 2017-11-03 ENCOUNTER — Other Ambulatory Visit: Payer: Self-pay

## 2017-11-03 DIAGNOSIS — D631 Anemia in chronic kidney disease: Secondary | ICD-10-CM | POA: Diagnosis present

## 2017-11-03 DIAGNOSIS — T68XXXA Hypothermia, initial encounter: Secondary | ICD-10-CM | POA: Diagnosis present

## 2017-11-03 DIAGNOSIS — R918 Other nonspecific abnormal finding of lung field: Secondary | ICD-10-CM | POA: Diagnosis not present

## 2017-11-03 DIAGNOSIS — A419 Sepsis, unspecified organism: Secondary | ICD-10-CM | POA: Diagnosis not present

## 2017-11-03 DIAGNOSIS — Z79899 Other long term (current) drug therapy: Secondary | ICD-10-CM | POA: Diagnosis not present

## 2017-11-03 DIAGNOSIS — I255 Ischemic cardiomyopathy: Secondary | ICD-10-CM | POA: Diagnosis present

## 2017-11-03 DIAGNOSIS — Z794 Long term (current) use of insulin: Secondary | ICD-10-CM

## 2017-11-03 DIAGNOSIS — E1169 Type 2 diabetes mellitus with other specified complication: Secondary | ICD-10-CM | POA: Diagnosis not present

## 2017-11-03 DIAGNOSIS — E8770 Fluid overload, unspecified: Secondary | ICD-10-CM

## 2017-11-03 DIAGNOSIS — S0003XD Contusion of scalp, subsequent encounter: Secondary | ICD-10-CM | POA: Diagnosis not present

## 2017-11-03 DIAGNOSIS — C50919 Malignant neoplasm of unspecified site of unspecified female breast: Secondary | ICD-10-CM | POA: Diagnosis not present

## 2017-11-03 DIAGNOSIS — R011 Cardiac murmur, unspecified: Secondary | ICD-10-CM

## 2017-11-03 DIAGNOSIS — W19XXXA Unspecified fall, initial encounter: Secondary | ICD-10-CM | POA: Diagnosis not present

## 2017-11-03 DIAGNOSIS — Z951 Presence of aortocoronary bypass graft: Secondary | ICD-10-CM | POA: Diagnosis not present

## 2017-11-03 DIAGNOSIS — I447 Left bundle-branch block, unspecified: Secondary | ICD-10-CM | POA: Diagnosis present

## 2017-11-03 DIAGNOSIS — I5042 Chronic combined systolic (congestive) and diastolic (congestive) heart failure: Secondary | ICD-10-CM | POA: Diagnosis present

## 2017-11-03 DIAGNOSIS — R0602 Shortness of breath: Secondary | ICD-10-CM | POA: Diagnosis not present

## 2017-11-03 DIAGNOSIS — Y92009 Unspecified place in unspecified non-institutional (private) residence as the place of occurrence of the external cause: Secondary | ICD-10-CM

## 2017-11-03 DIAGNOSIS — E785 Hyperlipidemia, unspecified: Secondary | ICD-10-CM | POA: Diagnosis present

## 2017-11-03 DIAGNOSIS — R9389 Abnormal findings on diagnostic imaging of other specified body structures: Secondary | ICD-10-CM

## 2017-11-03 DIAGNOSIS — R4182 Altered mental status, unspecified: Secondary | ICD-10-CM | POA: Diagnosis not present

## 2017-11-03 DIAGNOSIS — N185 Chronic kidney disease, stage 5: Secondary | ICD-10-CM

## 2017-11-03 DIAGNOSIS — Z923 Personal history of irradiation: Secondary | ICD-10-CM

## 2017-11-03 DIAGNOSIS — Z66 Do not resuscitate: Secondary | ICD-10-CM | POA: Diagnosis present

## 2017-11-03 DIAGNOSIS — E11649 Type 2 diabetes mellitus with hypoglycemia without coma: Secondary | ICD-10-CM | POA: Diagnosis present

## 2017-11-03 DIAGNOSIS — N2581 Secondary hyperparathyroidism of renal origin: Secondary | ICD-10-CM | POA: Diagnosis present

## 2017-11-03 DIAGNOSIS — S0003XA Contusion of scalp, initial encounter: Secondary | ICD-10-CM | POA: Diagnosis not present

## 2017-11-03 DIAGNOSIS — I132 Hypertensive heart and chronic kidney disease with heart failure and with stage 5 chronic kidney disease, or end stage renal disease: Secondary | ICD-10-CM | POA: Diagnosis present

## 2017-11-03 DIAGNOSIS — R001 Bradycardia, unspecified: Secondary | ICD-10-CM

## 2017-11-03 DIAGNOSIS — E162 Hypoglycemia, unspecified: Secondary | ICD-10-CM

## 2017-11-03 DIAGNOSIS — R809 Proteinuria, unspecified: Secondary | ICD-10-CM

## 2017-11-03 DIAGNOSIS — E1122 Type 2 diabetes mellitus with diabetic chronic kidney disease: Secondary | ICD-10-CM | POA: Diagnosis present

## 2017-11-03 DIAGNOSIS — Z89422 Acquired absence of other left toe(s): Secondary | ICD-10-CM

## 2017-11-03 DIAGNOSIS — R680 Hypothermia, not associated with low environmental temperature: Secondary | ICD-10-CM

## 2017-11-03 DIAGNOSIS — I2510 Atherosclerotic heart disease of native coronary artery without angina pectoris: Secondary | ICD-10-CM | POA: Diagnosis present

## 2017-11-03 DIAGNOSIS — J189 Pneumonia, unspecified organism: Secondary | ICD-10-CM | POA: Insufficient documentation

## 2017-11-03 DIAGNOSIS — E11621 Type 2 diabetes mellitus with foot ulcer: Secondary | ICD-10-CM | POA: Diagnosis present

## 2017-11-03 DIAGNOSIS — Z9889 Other specified postprocedural states: Secondary | ICD-10-CM

## 2017-11-03 DIAGNOSIS — Z91040 Latex allergy status: Secondary | ICD-10-CM

## 2017-11-03 DIAGNOSIS — J90 Pleural effusion, not elsewhere classified: Secondary | ICD-10-CM | POA: Diagnosis not present

## 2017-11-03 DIAGNOSIS — W1830XA Fall on same level, unspecified, initial encounter: Secondary | ICD-10-CM | POA: Diagnosis present

## 2017-11-03 DIAGNOSIS — I120 Hypertensive chronic kidney disease with stage 5 chronic kidney disease or end stage renal disease: Secondary | ICD-10-CM | POA: Diagnosis not present

## 2017-11-03 DIAGNOSIS — Z888 Allergy status to other drugs, medicaments and biological substances status: Secondary | ICD-10-CM

## 2017-11-03 DIAGNOSIS — I25810 Atherosclerosis of coronary artery bypass graft(s) without angina pectoris: Secondary | ICD-10-CM

## 2017-11-03 DIAGNOSIS — M1020 Drug-induced gout, unspecified site: Secondary | ICD-10-CM | POA: Diagnosis present

## 2017-11-03 DIAGNOSIS — S0083XA Contusion of other part of head, initial encounter: Secondary | ICD-10-CM | POA: Diagnosis present

## 2017-11-03 DIAGNOSIS — S0990XA Unspecified injury of head, initial encounter: Secondary | ICD-10-CM | POA: Diagnosis not present

## 2017-11-03 DIAGNOSIS — L97509 Non-pressure chronic ulcer of other part of unspecified foot with unspecified severity: Secondary | ICD-10-CM | POA: Diagnosis present

## 2017-11-03 DIAGNOSIS — I129 Hypertensive chronic kidney disease with stage 1 through stage 4 chronic kidney disease, or unspecified chronic kidney disease: Secondary | ICD-10-CM | POA: Diagnosis not present

## 2017-11-03 DIAGNOSIS — Z853 Personal history of malignant neoplasm of breast: Secondary | ICD-10-CM

## 2017-11-03 DIAGNOSIS — I951 Orthostatic hypotension: Secondary | ICD-10-CM | POA: Diagnosis present

## 2017-11-03 DIAGNOSIS — S0081XA Abrasion of other part of head, initial encounter: Secondary | ICD-10-CM | POA: Diagnosis present

## 2017-11-03 DIAGNOSIS — R0902 Hypoxemia: Secondary | ICD-10-CM | POA: Diagnosis not present

## 2017-11-03 DIAGNOSIS — S0083XD Contusion of other part of head, subsequent encounter: Secondary | ICD-10-CM | POA: Diagnosis not present

## 2017-11-03 DIAGNOSIS — E161 Other hypoglycemia: Secondary | ICD-10-CM | POA: Diagnosis not present

## 2017-11-03 DIAGNOSIS — K5900 Constipation, unspecified: Secondary | ICD-10-CM | POA: Diagnosis present

## 2017-11-03 DIAGNOSIS — Z7982 Long term (current) use of aspirin: Secondary | ICD-10-CM | POA: Diagnosis not present

## 2017-11-03 DIAGNOSIS — W07XXXD Fall from chair, subsequent encounter: Secondary | ICD-10-CM | POA: Diagnosis not present

## 2017-11-03 DIAGNOSIS — R404 Transient alteration of awareness: Secondary | ICD-10-CM

## 2017-11-03 DIAGNOSIS — Z89421 Acquired absence of other right toe(s): Secondary | ICD-10-CM

## 2017-11-03 DIAGNOSIS — N189 Chronic kidney disease, unspecified: Secondary | ICD-10-CM | POA: Diagnosis not present

## 2017-11-03 LAB — MRSA PCR SCREENING: MRSA by PCR: NEGATIVE

## 2017-11-03 LAB — URINALYSIS, ROUTINE W REFLEX MICROSCOPIC
BILIRUBIN URINE: NEGATIVE
Glucose, UA: 100 mg/dL — AB
Hgb urine dipstick: NEGATIVE
KETONES UR: NEGATIVE mg/dL
Leukocytes, UA: NEGATIVE
Nitrite: NEGATIVE
PH: 5 (ref 5.0–8.0)
Protein, ur: 100 mg/dL — AB
SPECIFIC GRAVITY, URINE: 1.02 (ref 1.005–1.030)

## 2017-11-03 LAB — CBG MONITORING, ED
GLUCOSE-CAPILLARY: 210 mg/dL — AB (ref 70–99)
Glucose-Capillary: 69 mg/dL — ABNORMAL LOW (ref 70–99)
Glucose-Capillary: 178 mg/dL — ABNORMAL HIGH (ref 70–99)
Glucose-Capillary: 131 mg/dL — ABNORMAL HIGH (ref 70–99)

## 2017-11-03 LAB — CBC WITH DIFFERENTIAL/PLATELET
ABS IMMATURE GRANULOCYTES: 0 10*3/uL (ref 0.0–0.1)
BASOS ABS: 0 10*3/uL (ref 0.0–0.1)
BASOS PCT: 0 %
Eosinophils Absolute: 0.1 10*3/uL (ref 0.0–0.7)
Eosinophils Relative: 1 %
HCT: 36.4 % (ref 36.0–46.0)
Hemoglobin: 11.1 g/dL — ABNORMAL LOW (ref 12.0–15.0)
Immature Granulocytes: 0 %
LYMPHS PCT: 17 %
Lymphs Abs: 1 10*3/uL (ref 0.7–4.0)
MCH: 27.6 pg (ref 26.0–34.0)
MCHC: 30.5 g/dL (ref 30.0–36.0)
MCV: 90.5 fL (ref 78.0–100.0)
MONOS PCT: 7 %
Monocytes Absolute: 0.4 10*3/uL (ref 0.1–1.0)
NEUTROS PCT: 75 %
Neutro Abs: 4.3 10*3/uL (ref 1.7–7.7)
PLATELETS: 210 10*3/uL (ref 150–400)
RBC: 4.02 MIL/uL (ref 3.87–5.11)
RDW: 16.6 % — ABNORMAL HIGH (ref 11.5–15.5)
WBC: 5.8 10*3/uL (ref 4.0–10.5)

## 2017-11-03 LAB — URINALYSIS, MICROSCOPIC (REFLEX)

## 2017-11-03 LAB — I-STAT TROPONIN, ED: TROPONIN I, POC: 0.08 ng/mL (ref 0.00–0.08)

## 2017-11-03 LAB — RAPID URINE DRUG SCREEN, HOSP PERFORMED
Amphetamines: NOT DETECTED
BARBITURATES: NOT DETECTED
Benzodiazepines: NOT DETECTED
Cocaine: NOT DETECTED
Opiates: NOT DETECTED
TETRAHYDROCANNABINOL: NOT DETECTED

## 2017-11-03 LAB — COMPREHENSIVE METABOLIC PANEL
ALK PHOS: 117 U/L (ref 38–126)
ALT: 78 U/L — ABNORMAL HIGH (ref 0–44)
ANION GAP: 12 (ref 5–15)
AST: 53 U/L — ABNORMAL HIGH (ref 15–41)
Albumin: 3.3 g/dL — ABNORMAL LOW (ref 3.5–5.0)
BILIRUBIN TOTAL: 0.7 mg/dL (ref 0.3–1.2)
BUN: 79 mg/dL — ABNORMAL HIGH (ref 8–23)
CALCIUM: 9.3 mg/dL (ref 8.9–10.3)
CO2: 23 mmol/L (ref 22–32)
Chloride: 108 mmol/L (ref 98–111)
Creatinine, Ser: 3.16 mg/dL — ABNORMAL HIGH (ref 0.44–1.00)
GFR, EST AFRICAN AMERICAN: 16 mL/min — AB (ref >60)
GFR, EST NON AFRICAN AMERICAN: 14 mL/min — AB (ref >60)
Glucose, Bld: 63 mg/dL — ABNORMAL LOW (ref 70–99)
POTASSIUM: 3.5 mmol/L (ref 3.5–5.1)
Sodium: 143 mmol/L (ref 135–145)
TOTAL PROTEIN: 6.4 g/dL — AB (ref 6.5–8.1)

## 2017-11-03 LAB — PHOSPHORUS: Phosphorus: 5.9 mg/dL — ABNORMAL HIGH (ref 2.5–4.6)

## 2017-11-03 LAB — MAGNESIUM: Magnesium: 2.8 mg/dL — ABNORMAL HIGH (ref 1.7–2.4)

## 2017-11-03 LAB — LIPASE, BLOOD: LIPASE: 105 U/L — AB (ref 11–51)

## 2017-11-03 LAB — TSH: TSH: 1.965 u[IU]/mL (ref 0.350–4.500)

## 2017-11-03 LAB — BRAIN NATRIURETIC PEPTIDE: B NATRIURETIC PEPTIDE 5: 1413.2 pg/mL — AB (ref 0.0–100.0)

## 2017-11-03 LAB — I-STAT CG4 LACTIC ACID, ED
LACTIC ACID, VENOUS: 0.73 mmol/L (ref 0.5–1.9)
Lactic Acid, Venous: 0.77 mmol/L (ref 0.5–1.9)

## 2017-11-03 LAB — GLUCOSE, CAPILLARY: Glucose-Capillary: 176 mg/dL — ABNORMAL HIGH (ref 70–99)

## 2017-11-03 LAB — PROTIME-INR
INR: 1.18
PROTHROMBIN TIME: 14.9 s (ref 11.4–15.2)

## 2017-11-03 LAB — T4, FREE: Free T4: 1.12 ng/dL (ref 0.82–1.77)

## 2017-11-03 MED ORDER — ACETAMINOPHEN 325 MG PO TABS
650.0000 mg | ORAL_TABLET | Freq: Four times a day (QID) | ORAL | Status: DC | PRN
  Administered 2017-11-04 – 2017-11-05 (×3): 650 mg via ORAL
  Filled 2017-11-03 (×3): qty 2

## 2017-11-03 MED ORDER — ACETAMINOPHEN 650 MG RE SUPP: 650.0000 mg | Freq: Four times a day (QID) | RECTAL | Status: DC | PRN

## 2017-11-03 MED ORDER — SODIUM CHLORIDE 0.9 % IV SOLN
1.0000 g | INTRAVENOUS | Status: DC
  Filled 2017-11-03: qty 10

## 2017-11-03 MED ORDER — DEXTROSE 50 % IV SOLN
1.0000 | Freq: Once | INTRAVENOUS | Status: AC
  Administered 2017-11-03: 50 mL via INTRAVENOUS
  Filled 2017-11-03: qty 50

## 2017-11-03 MED ORDER — SODIUM CHLORIDE 0.9 % IV SOLN
1.0000 g | Freq: Once | INTRAVENOUS | Status: AC
  Administered 2017-11-03: 1 g via INTRAVENOUS
  Filled 2017-11-03: qty 10

## 2017-11-03 MED ORDER — INSULIN ASPART 100 UNIT/ML ~~LOC~~ SOLN
0.0000 [IU] | SUBCUTANEOUS | Status: DC
  Administered 2017-11-03: 1 [IU] via SUBCUTANEOUS
  Administered 2017-11-03 – 2017-11-04 (×5): 2 [IU] via SUBCUTANEOUS
  Administered 2017-11-04: 3 [IU] via SUBCUTANEOUS
  Administered 2017-11-05 (×2): 2 [IU] via SUBCUTANEOUS
  Filled 2017-11-03: qty 1

## 2017-11-03 MED ORDER — DEXTROSE 5 % IV SOLN
250.0000 mg | INTRAVENOUS | Status: DC
  Filled 2017-11-03: qty 250

## 2017-11-03 MED ORDER — SODIUM CHLORIDE 0.9 % IV SOLN
500.0000 mg | Freq: Once | INTRAVENOUS | Status: AC
  Administered 2017-11-03: 500 mg via INTRAVENOUS
  Filled 2017-11-03: qty 500

## 2017-11-03 NOTE — ED Notes (Signed)
Husband [name] can be reached at [phone]

## 2017-11-03 NOTE — ED Provider Notes (Signed)
[place] EMERGENCY DEPARTMENT Provider Note   CSN: [id number] Arrival date & time: 11/03/17  0804     History   Chief Complaint No chief complaint on file. altered loc  HPI [name] is a 72 y.o. female.  The history is provided by the patient and medical records. No language interpreter was used.  Fall  This is a new problem. The current episode started less than 1 hour ago. The problem occurs constantly. The problem has not changed since onset.Associated symptoms include headaches and shortness of breath. Pertinent negatives include no chest pain and no abdominal pain. Nothing aggravates the symptoms. Nothing relieves the symptoms. She has tried nothing for the symptoms. The treatment provided no relief.    No past medical history on file.  There are no active problems to display for this patient.    OB History   None      Home Medications    Prior to Admission medications   Medication Sig Start Date End Date Taking? Authorizing Provider  allopurinol (ZYLOPRIM) 100 MG tablet Take 100 mg by mouth 2 (two) times daily.   Yes [provider]  anastrozole (ARIMIDEX) 1 MG tablet Take 1 mg by mouth daily. 10/12/17  Yes [provider]  aspirin EC 81 MG tablet Take 81 mg by mouth daily.   Yes [provider]  aspirin-acetaminophen-caffeine (EXCEDRIN MIGRAINE) [phone] MG tablet Take 2 tablets by mouth every 6 (six) hours as needed for headache or migraine.   Yes [provider]  atorvastatin (LIPITOR) 80 MG tablet Take 80 mg by mouth daily. 10/27/17  Yes [provider]  carboxymethylcellulose (REFRESH PLUS) 0.5 % SOLN Place 1 drop into both eyes daily as needed (dry eyes).   Yes [provider]  carvedilol (COREG) 25 MG tablet Take 25 mg by mouth 2 (two) times daily. 10/27/17  Yes [provider]  furosemide (LASIX) 40 MG tablet Take 80 mg by mouth 2 (two) times daily.   Yes [provider]  hydrALAZINE (APRESOLINE) 25 MG tablet Take 75 mg by mouth 3 (three) times daily. 09/15/17  Yes [provider]  insulin NPH-regular Human (NOVOLIN 70/30) (70-30) 100 UNIT/ML injection Inject 30-60 Units into the skin See admin instructions. Taking 60 units in the morning, 30 units in the evening   Yes [provider]  isosorbide dinitrate (ISORDIL) 10 MG tablet Take 10 mg by mouth 3 (three) times daily. 10/27/17  Yes [provider]  Multiple Vitamins-Minerals ([place]) Take 1 tablet by mouth daily.   Yes [provider]  sodium chloride (OCEAN) 0.65 % SOLN nasal spray Place 1 spray into both nostrils as needed for congestion.   Yes [provider]  Vitamin D, Ergocalciferol, 2000 units CAPS Take 2,000 Units by mouth daily.   Yes [provider]    Family History No family history on file.  Social History Social History   Tobacco Use  . Smoking status: Not on file  Substance Use Topics  . Alcohol use: Not on file  . Drug use: Not on file     Allergies   Patient has no allergy information on record.   Review of Systems Review of Systems  Constitutional: Positive for chills and fatigue. Negative for diaphoresis and fever.  HENT: Negative for congestion and rhinorrhea.   Respiratory: Positive for cough and shortness of breath. Negative for chest tightness and wheezing.   Cardiovascular: Negative for chest pain  and palpitations.  Gastrointestinal: Negative for abdominal pain, diarrhea, nausea and vomiting.  Genitourinary: Positive for decreased urine volume. Negative for dysuria.  Musculoskeletal: Negative for back pain, neck pain and neck stiffness.  Skin: Positive for wound. Negative for rash.  Neurological: Positive for light-headedness and headaches. Negative for dizziness and weakness.  Psychiatric/Behavioral: Negative for agitation and confusion.  All other systems reviewed and are  negative.    Physical Exam Updated Vital Signs BP (!) 150/57   Pulse 72   Temp (!) 92.9 F (33.8 C) Comment: Pt on bairhugger  Resp (!) 23   Ht 5\' 5"  (1.651 m)   Wt 77.1 kg (170 lb)   SpO2 93%   BMI 28.29 kg/m   Physical Exam  Constitutional: She appears well-developed and well-nourished. No distress.  HENT:  Head: Normocephalic. Head is with contusion. Head is without laceration.    Nose: Nose normal.  Mouth/Throat: Oropharynx is clear and moist. No oropharyngeal exudate.  Eyes: Pupils are equal, round, and reactive to light. Conjunctivae and EOM are normal.  Neck: Normal range of motion. Neck supple.  Cardiovascular: Intact distal pulses. Bradycardia present.  No murmur heard. Pulmonary/Chest: Effort normal. No respiratory distress. She has no wheezes. She exhibits no tenderness.  Abdominal: Soft. She exhibits no distension. There is no tenderness.  Musculoskeletal: She exhibits no edema or tenderness.  Neurological: No cranial nerve deficit or sensory deficit. She exhibits normal muscle tone. Coordination normal.  Skin: No rash noted. She is not diaphoretic. No erythema.  Nursing note and vitals reviewed.    ED Treatments / Results  Labs (all labs ordered are listed, but only abnormal results are displayed) Labs Reviewed  CBC WITH DIFFERENTIAL/PLATELET - Abnormal; Notable for the following components:      Result Value   Hemoglobin 11.1 (*)    RDW 16.6 (*)    All other components within normal limits  COMPREHENSIVE METABOLIC PANEL - Abnormal; Notable for the following components:   Glucose, Bld 63 (*)    BUN 79 (*)    Creatinine, Ser 3.16 (*)    Total Protein 6.4 (*)    Albumin 3.3 (*)    AST 53 (*)    ALT 78 (*)    GFR calc non Af Amer 14 (*)    GFR calc Af Amer 16 (*)    All other components within normal limits  LIPASE, BLOOD - Abnormal; Notable for the following components:   Lipase 105 (*)    All other components within normal limits  BRAIN  NATRIURETIC PEPTIDE - Abnormal; Notable for the following components:   B Natriuretic Peptide 1,413.2 (*)    All other components within normal limits  MAGNESIUM - Abnormal; Notable for the following components:   Magnesium 2.8 (*)    All other components within normal limits  URINALYSIS, ROUTINE W REFLEX MICROSCOPIC - Abnormal; Notable for the following components:   Glucose, UA 100 (*)    Protein, ur 100 (*)    All other components within normal limits  URINALYSIS, MICROSCOPIC (REFLEX) - Abnormal; Notable for the following components:   Bacteria, UA RARE (*)    All other components within normal limits  CBG MONITORING, ED - Abnormal; Notable for the following components:   Glucose-Capillary 69 (*)    All other components within normal limits  CBG MONITORING, ED - Abnormal; Notable for the following components:   Glucose-Capillary 210 (*)    All other components within normal limits  CBG MONITORING, ED - Abnormal;  Notable for the following components:   Glucose-Capillary 178 (*)    All other components within normal limits  URINE CULTURE  CULTURE, BLOOD (ROUTINE X 2)  CULTURE, BLOOD (ROUTINE X 2)  PROTIME-INR  RAPID URINE DRUG SCREEN, HOSP PERFORMED  TSH  T4, FREE  I-STAT CG4 LACTIC ACID, ED  I-STAT TROPONIN, ED  I-STAT CG4 LACTIC ACID, ED  I-STAT TROPONIN, ED    EKG EKG Interpretation  Date/Time:  Tuesday November 03 2017 08:13:03 EDT Ventricular Rate:  65 PR Interval:    QRS Duration: 183 QT Interval:  643 QTC Calculation: 551 R Axis:   1 Text Interpretation:  sinus bradycardia Left bundle branch block Baseline wander in lead(s) V3 No STEMI Confirmed by Antony Blackbird [phone]) on 11/03/2017 8:20:17 AM Also confirmed by Antony Blackbird [phone]), editor [place], Jeannetta Nap [phone])  on 11/03/2017 9:12:53 AM   Radiology Ct Head Wo Contrast  Result Date: 11/03/2017 CLINICAL DATA:  Fall.  Altered mental status. EXAM: CT HEAD WITHOUT CONTRAST TECHNIQUE: Contiguous axial images were  obtained from the base of the skull through the vertex without intravenous contrast. COMPARISON:  None. FINDINGS: Brain: There is no evidence of acute infarct, intracranial hemorrhage, mass, midline shift, or extra-axial fluid collection. There is a small left cerebellar infarct with volume loss compatible with a chronic infarct. Bilateral basal ganglia lacunar infarcts are also most likely chronic. Patchy periventricular white matter hypodensities are nonspecific but compatible with mild chronic small vessel ischemic disease. Vascular: Calcified atherosclerosis at the skull base. No hyperdense vessel. Skull: No fracture or suspicious osseous lesion. Moderate-sized right frontal scalp hematoma with underlying prominent vascular channel in the frontal bone. Sinuses/Orbits: Chronic sphenoid sinusitis with sinus expansion, wall thickening and sclerosis, and complex and partially calcified central material in the sinus. Milder mucosal thickening elsewhere in the paranasal sinuses. Clear mastoid air cells. Bilateral cataract extraction. Other: None. IMPRESSION: 1. No evidence of acute intracranial abnormality. 2. Right frontal scalp hematoma.  No skull fracture. 3. Small basal ganglia and cerebellar infarcts, likely chronic. 4. Mild chronic small vessel ischemic white matter disease. 5. Chronic sphenoid sinusitis. These results were called by telephone at the time of interpretation on 11/03/2017 at 9:16 am to Dr. Marda Stalker , who verbally acknowledged these results. Electronically Signed   By: Logan Bores M.D.   On: 11/03/2017 09:29   Dg Chest Portable 1 View  Result Date: 11/03/2017 CLINICAL DATA:  Shortness of breath and recent fall, initial encounter EXAM: PORTABLE CHEST 1 VIEW COMPARISON:  None. FINDINGS: Cardiac shadow is enlarged. Aortic calcifications are again seen. The lungs are well aerated bilaterally with patchy infiltrate in the right lung base. No sizable effusion is noted. No bony abnormality  is seen. IMPRESSION: Right basilar infiltrate. Followup PA and lateral chest X-ray is recommended in 3-4 weeks following trial of antibiotic therapy to ensure resolution and exclude underlying malignancy. Electronically Signed   By: Inez Catalina M.D.   On: 11/03/2017 08:48    Procedures Procedures (including critical care time)  CRITICAL CARE Performed by: Gwenyth Allegra Tegeler Total critical care time: 45 minutes Critical care time was exclusive of separately billable procedures and treating other patients. Patient arrives being externally paced, hypothermic, with pneumonia requiring multiple antibiotics.  Patient is a stepdown unit.   Critical care was necessary to treat or prevent imminent or life-threatening deterioration. Critical care was time spent personally by me on the following activities: development of treatment plan with patient and/or surrogate as well as nursing, discussions with consultants,  evaluation of patient's response to treatment, examination of patient, obtaining history from patient or surrogate, ordering and performing treatments and interventions, ordering and review of laboratory studies, ordering and review of radiographic studies, pulse oximetry and re-evaluation of patient's condition.   Medications Ordered in ED Medications  acetaminophen (TYLENOL) tablet 650 mg (has no administration in time range)    Or  acetaminophen (TYLENOL) suppository 650 mg (has no administration in time range)  insulin aspart (novoLOG) injection 0-9 Units (0 Units Subcutaneous Not Given 11/03/17 1242)  azithromycin (ZITHROMAX) 250 mg in dextrose 5 % 125 mL IVPB (has no administration in time range)  cefTRIAXone (ROCEPHIN) 1 g in sodium chloride 0.9 % 100 mL IVPB (has no administration in time range)  dextrose 50 % solution 50 mL (50 mLs Intravenous Given 11/03/17 0815)  cefTRIAXone (ROCEPHIN) 1 g in sodium chloride 0.9 % 100 mL IVPB (0 g Intravenous Stopped 11/03/17 0948)  azithromycin  (ZITHROMAX) 500 mg in sodium chloride 0.9 % 250 mL IVPB (0 mg Intravenous Stopped 11/03/17 1030)     Initial Impression / Assessment and Plan / ED Course  I have reviewed the triage vital signs and the nursing notes.  Pertinent labs & imaging results that were available during my care of the patient were reviewed by me and considered in my medical decision making (see chart for details).     [name] is a 72 y.o. female with a past medical history significant for breast cancer status post radiation, CKD, CAD status post CABG, known left bundle branch block, diabetes, and prior foot osteomyelitis who presents with fall and altered mental status.  Patient is also complaining of shortness of breath.  Patient is brought by EMS reports that patient slept on the couch last night and then husband heard a thump this morning when patient was felt to rolled off the couch.  Patient sustained a large hematoma to the left forehead and was acting confused.  EMS found the patient bradycardic in the 30s and 40s, somnolent, and hypoglycemic.  They gave her 2 tubes of oral glucose with minimal improvement in her glucose.  Patient was also paced in route to due to the concern for symptomatic bradycardia.  Patient reports that she is feeling fatigued and tired but is also having a nonproductive cough and has had some decreased urination.  On arrival, patient was found to be hypoglycemic and hypothermic.  Rectal temperature was 88.  On arrival, a level 2 trauma was activated for altered mental status in the setting of a fall with obvious head trauma.  Patient was able to follow all commands.  Airway was intact and breath sounds are equal bilaterally.  Patient was mentating reaction saturations on room air.  Initial blood pressure was not hypotensive.  Patient had a portable chest x-ray which did not show evidence pneumothorax on bedside review.  Patient's pupils are 3 mill meters and reactive bilaterally.  Due to  concern for cough and urinary symptoms, patient may have occult infection.  With her hypo-thermia, hypoglycemia patient will have glucose provided and also have a TSH checked among other labs.  Anticipate admission after work-up is completed.  Patient with head CT to look for intracranial injury.  On reassessment, patient reports that her foot is no worse than baseline.  There is no redness or erythema going up from the left second toe wound.  Patient does not think this is the cause of her symptoms.  8:58 AM Work-up began to return.  Patient found to have a pneumonia.  In the setting of patient's cough, shortness breath, and bradycardia, patient will be given antibiotics for community associated pneumonia.  Patient was also placed on nasal cannula oxygen supplementation for her shortness of breath.  This helped improve it.  Has been arrived and came to the bedside since she has had a cough worsening for the last few days.  He agrees with what EMS described this morning.  He also reports that she was supposed to be started on dialysis 3 weeks ago but due to her inability to have procedures, the decided to hold.    9:21 AM Radiology was called and they report they do not see evidence of a skull fracture or any intracranial hemorrhage.  Suspect all of her symptoms related to the pneumonia.    Medicine team will be called for admission.   Final Clinical Impressions(s) / ED Diagnoses   Final diagnoses:  Community acquired pneumonia, unspecified laterality  Altered mental status, unspecified altered mental status type  Bradycardia  Hypothermia, initial encounter  Hypoglycemia     Clinical Impression: 1. Community acquired pneumonia, unspecified laterality   2. Altered mental status, unspecified altered mental status type   3. Bradycardia   4. Hypothermia, initial encounter   5. Hypoglycemia     Disposition: Admit  This note was prepared with assistance of Dragon voice recognition  software. Occasional wrong-word or sound-a-like substitutions may have occurred due to the inherent limitations of voice recognition software.     Tegeler, Gwenyth Allegra, MD 11/03/17 [phone]

## 2017-11-03 NOTE — ED Notes (Signed)
Admitting MD at bedside.

## 2017-11-03 NOTE — ED Notes (Signed)
Attempted report x1. 

## 2017-11-03 NOTE — ED Notes (Signed)
Level 2 trauma fall. No respiratory distress noted at this time.

## 2017-11-03 NOTE — ED Notes (Signed)
Patient states she makes urine however very little.

## 2017-11-03 NOTE — Consult Note (Addendum)
[facility] Renal Consultation Note  Requesting MD:  Dr. Lynnae January Indication for Consultation: CKD   Duplicate chart --> [id number]   HPI: 56F with HTN, HL, type 2 DM, CKD V, CAD s/p CABG 2011, ischemic CM, h/o breast Ca who presented to [facility] ED today after a fall with AMS.    She's had a few weeks of generalized malaise and early satiety with several episodes of hypoglycemia.  In the past few days she's had decline in UOP.  This morning her husband heard her yelling at 6:30 and found her on the ground moaning.  EMS was called.  In the ED she was confused, bradycardic to 30-40s, hypothermic, hypoglycemic, L forehead hematoma.  EKG sinus brady with old LBBB, CT +superficial hematoma, CXR R basilar infiltrate.  Rec'd CTX and azithromycin for pneumonia.  At the time of my evaluation she is already feeling much improved compared to presentation.  She does note a cough recently.  No dysgeusia, hiccups, pruritis.  She notes early satiety and a feeling of abdominal distention lately, mild constipation.  Says urine stream is weak in the past few days.  No dysuria, hematuria.   She follows with Dr. Hollie Salk in clinic.  An AVF was planned for a couple weeks ago but was delayed due to a diabetic toe ulcer.  BUN/Cr 77 / 4.5 about 3 months ago and today were 79/3.16.  K 3.5. Lactate normal.   [name] is a 72 y.o. female.   Creatinine, Ser  Date/Time Value Ref Range Status  11/03/2017 08:28 AM 3.16 (H) 0.44 - 1.00 mg/dL Final    PMHx: PMH per HPI above.  Family Hx: No family history on file.  Social History: Denies t/e/d. Lives with husband.   Allergies:  Allergies  Allergen Reactions  . Atenolol Other (See Comments)    Connection to gout ?  Marland Kitchen Latex Rash    Medications: Prior to Admission medications   Medication Sig Start Date End Date Taking? Authorizing Provider  allopurinol (ZYLOPRIM) 100 MG tablet Take 100 mg by mouth 2 (two) times daily.   Yes [provider]   anastrozole (ARIMIDEX) 1 MG tablet Take 1 mg by mouth daily. 10/12/17  Yes [provider]  aspirin EC 81 MG tablet Take 81 mg by mouth daily.   Yes [provider]  aspirin-acetaminophen-caffeine (EXCEDRIN MIGRAINE) [phone] MG tablet Take 2 tablets by mouth every 6 (six) hours as needed for headache or migraine.   Yes [provider]  atorvastatin (LIPITOR) 80 MG tablet Take 80 mg by mouth daily. 10/27/17  Yes [provider]  carboxymethylcellulose (REFRESH PLUS) 0.5 % SOLN Place 1 drop into both eyes daily as needed (dry eyes).   Yes [provider]  carvedilol (COREG) 25 MG tablet Take 25 mg by mouth 2 (two) times daily. 10/27/17  Yes [provider]  furosemide (LASIX) 40 MG tablet Take 80 mg by mouth 2 (two) times daily.   Yes [provider]  hydrALAZINE (APRESOLINE) 25 MG tablet Take 75 mg by mouth 3 (three) times daily. 09/15/17  Yes [provider]  insulin NPH-regular Human (NOVOLIN 70/30) (70-30) 100 UNIT/ML injection Inject 30-60 Units into the skin See admin instructions. Taking 60 units in the morning, 30 units in the evening   Yes [provider]  isosorbide dinitrate (ISORDIL) 10 MG tablet Take 10 mg by mouth 3 (three) times daily. 10/27/17  Yes [provider]  Multiple Vitamins-Minerals (Melissa)  Take 1 tablet by mouth daily.   Yes [provider]  sodium chloride (OCEAN) 0.65 % SOLN nasal spray Place 1 spray into both nostrils as needed for congestion.   Yes [provider]  Vitamin D, Ergocalciferol, 2000 units CAPS Take 2,000 Units by mouth daily.   Yes [provider]     I have reviewed the patient's current medications. Prior to Admission:  (Not in a hospital admission) Scheduled: . insulin aspart  0-9 Units Subcutaneous Q4H    Labs:  Results for orders placed or performed during the hospital encounter of 11/03/17 (from the past 48  hour(s))  TSH     Status: None   Collection Time: 11/03/17  8:11 AM  Result Value Ref Range   TSH 1.965 0.350 - 4.500 uIU/mL    Comment: Performed by a 3rd Generation assay with a functional sensitivity of <=0.01 uIU/mL. Performed at [facility], [place] [address] [place], [place] [address]   POC CBG, ED     Status: Abnormal   Collection Time: 11/03/17  8:18 AM  Result Value Ref Range   Glucose-Capillary 69 (L) 70 - 99 mg/dL  CBC with Differential     Status: Abnormal   Collection Time: 11/03/17  8:28 AM  Result Value Ref Range   WBC 5.8 4.0 - 10.5 K/uL   RBC 4.02 3.87 - 5.11 MIL/uL   Hemoglobin 11.1 (L) 12.0 - 15.0 g/dL   HCT 36.4 36.0 - 46.0 %   MCV 90.5 78.0 - 100.0 fL   MCH 27.6 26.0 - 34.0 pg   MCHC 30.5 30.0 - 36.0 g/dL   RDW 16.6 (H) 11.5 - 15.5 %   Platelets 210 150 - 400 K/uL   Neutrophils Relative % 75 %   Neutro Abs 4.3 1.7 - 7.7 K/uL   Lymphocytes Relative 17 %   Lymphs Abs 1.0 0.7 - 4.0 K/uL   Monocytes Relative 7 %   Monocytes Absolute 0.4 0.1 - 1.0 K/uL   Eosinophils Relative 1 %   Eosinophils Absolute 0.1 0.0 - 0.7 K/uL   Basophils Relative 0 %   Basophils Absolute 0.0 0.0 - 0.1 K/uL   Immature Granulocytes 0 %   Abs Immature Granulocytes 0.0 0.0 - 0.1 K/uL    Comment: Performed at [facility], [address], [place], [place] [address]  Comprehensive metabolic panel     Status: Abnormal   Collection Time: 11/03/17  8:28 AM  Result Value Ref Range   Sodium 143 135 - 145 mmol/L   Potassium 3.5 3.5 - 5.1 mmol/L   Chloride 108 98 - 111 mmol/L   CO2 23 22 - 32 mmol/L   Glucose, Bld 63 (L) 70 - 99 mg/dL   BUN 79 (H) 8 - 23 mg/dL   Creatinine, Ser 3.16 (H) 0.44 - 1.00 mg/dL   Calcium 9.3 8.9 - 10.3 mg/dL   Total Protein 6.4 (L) 6.5 - 8.1 g/dL   Albumin 3.3 (L) 3.5 - 5.0 g/dL   AST 53 (H) 15 - 41 U/L   ALT 78 (H) 0 - 44 U/L   Alkaline Phosphatase 117 38 - 126 U/L   Total Bilirubin 0.7 0.3 - 1.2 mg/dL   GFR calc non Af Amer 14 (L) >60 mL/min    GFR calc Af Amer 16 (L) >60 mL/min    Comment: (NOTE) The eGFR has been calculated using the CKD EPI equation. This calculation has not been validated in all clinical situations.  eGFR's persistently <60 mL/min signify possible Chronic Kidney Disease.    Anion gap 12 5 - 15    Comment: Performed at [facility] [address] [place], [place] [address]  Lipase, blood     Status: Abnormal   Collection Time: 11/03/17  8:28 AM  Result Value Ref Range   Lipase 105 (H) 11 - 51 U/L    Comment: Performed at [facility] [address], [place], [place] [address]  Protime-INR     Status: None   Collection Time: 11/03/17  8:28 AM  Result Value Ref Range   Prothrombin Time 14.9 11.4 - 15.2 seconds   INR 1.18     Comment: Performed at [facility] [address] [place], [place] [address]  Brain natriuretic peptide     Status: Abnormal   Collection Time: 11/03/17  8:28 AM  Result Value Ref Range   B Natriuretic Peptide 1,413.2 (H) 0.0 - 100.0 pg/mL    Comment: Performed at [facility] [address], [place], [place] [address]  Magnesium     Status: Abnormal   Collection Time: 11/03/17  8:28 AM  Result Value Ref Range   Magnesium 2.8 (H) 1.7 - 2.4 mg/dL    Comment: Performed at [facility] [address] [place], [place] [address]  I-stat troponin, ED     Status: None   Collection Time: 11/03/17  8:38 AM  Result Value Ref Range   Troponin i, poc 0.08 0.00 - 0.08 ng/mL   Comment 3            Comment: Due to the release kinetics of cTnI, a negative result within the first hours of the onset of symptoms does not rule out myocardial infarction with certainty. If myocardial infarction is still suspected, repeat the test at appropriate intervals.   I-Stat CG4 Lactic Acid, ED     Status: None   Collection Time: 11/03/17  8:40 AM  Result Value Ref Range   Lactic Acid, Venous 0.73 0.5 - 1.9 mmol/L  CBG monitoring, ED     Status: Abnormal    Collection Time: 11/03/17  9:53 AM  Result Value Ref Range   Glucose-Capillary 210 (H) 70 - 99 mg/dL  I-Stat CG4 Lactic Acid, ED     Status: None   Collection Time: 11/03/17 11:44 AM  Result Value Ref Range   Lactic Acid, Venous 0.77 0.5 - 1.9 mmol/L  CBG monitoring, ED     Status: Abnormal   Collection Time: 11/03/17 12:09 PM  Result Value Ref Range   Glucose-Capillary 178 (H) 70 - 99 mg/dL     ROS:  Pertinent items are noted in HPI.  Physical Exam: Vitals:   11/03/17 1215 11/03/17 1230  BP: (!) 149/60 (!) 151/60  Pulse: 63 64  Resp: 19 16  Temp:    SpO2: 96% 96%     General: chronically ill appearing, tired but alert and nontoxic HEENT: MMM Eyes: anicteric Neck: cannot appreciate JVD Heart: RRR, no rub or S4 Lungs: clear anteriorly and laterally decreased at bases.  Normal WOB Abdomen: nontender, distended Extremities: 2+ pitting LE edema Skin: no rashes Neuro: conversant, able to recall all details, no asterixis  Assessment/Plan: 1.  CKD - current renal function better than most recent labs and she does not have uremic symptoms.  Her difficulty eating recently seems more like early satiety - unclear if related to ascites or other abd issue.  I  think her altered mental status on presentation was not related to uremia and she does not currently need dialysis support.  Her UOP has declined over the past several days per her report, follow strict I/Os and daily weights.  PO intake hasn't been robust so we'll hold her po diuretic for the moment, but with her advanced CKD and heart failure will need to watch closely.   2.  HTN - currently a bit hypertensive, continue home medications and follow.   3.  Suspected sepsis - care per primary  4.  Anemic of CKD - mild, doesn't need ESA  5.  Secondary HyperPTH - PTH in 07/2017 was 147.  Ca ok today, add on phos.    Jannifer Hick A 11/03/2017, 12:50 PM

## 2017-11-03 NOTE — H&P (Addendum)
** Duplicate Chart: [id number] **   Date: 11/03/2017               Patient Name:  [name] NO MRN: [id number]  DOB: 1945/04/18 Age / Sex: 72 y.o., female   PCP: Unk Pinto, MD         Medical Service: Internal Medicine Teaching Service         Attending Physician: Dr. Bartholomew Crews, MD    First Contact: Dr. Koleen Distance  Pager: [phone]  Second Contact: Dr. Jari Favre Pager: [phone]       After Hours (After 5p/  First Contact Pager: [phone]  weekends / holidays): Second Contact Pager: [phone]   Chief Complaint: Fall, AMS  History of Present Illness: Patient is a 72 yo F with a pmhx of HTN, HLD, Type II DM, CKD V approaching ESRD, CAD s/p CABG in 2011, ischemic cardiomyopathy with chronic combined systolic and diastolic heart failure (EF 45-50%), and breast cancer s/p lumpectomy and adjuvant radiation in 2015 currently on anastrozole presenting from home with head injury from a fall and AMS. Husband at bedside helped provide the history. Husband reports patient has not been feeling well for the past couple of weeks with non specific symptoms of generalized malaise and some nausea. She has had a loss of appetite with poor PO intake and multiple episodes of hypoglycemia. Patient has CKD V at baseline and has been slowly approaching the need for dialysis. She was planning to have a fistula or graft placed two weeks ago per vascular surgery, but this was delayed due to an infected diabetic ulcer on her left toe. Patient reports she has poor UOP at baseline, but this abruptly decreased 2 days ago. She has the urge to urinate but only minimal output when she goes to the bathroom. Patient normally sleeps on the couch at night. This morning, husband heard her yelling at 6:30 am. When he walked into the living room he found her face down on the ground. She was alert but moaning and not answering questions. He reports the floor was carpeted but unsure if patient hit her head on the table  beside the couch. She has abrasions on her face and was bleeding. He was unable to get her up off the floor and called EMS to bring her to the ER.   On EMS arrival, they found patient to have a large hematoma on her left forehead and acting confused. She was bradycardic to the 30-40s, somnolent, and hypoglycemic. She was paced en route to the ED due to concern for symptomatic bradycardia. On arrival to the ED, level 2 trauma was activated for AMS with head trauma. Patient was able to follow commands. She was found to be hypothermic with rectal temperature of 88.65F and bradycardic to the 40s with BP 152/65 and oxygen 97% on RA. Pacing was discontinued in the ED and patient continued to maintain appropriate mentation with HR in the 40-50s. EKG demonstrated sinus bradycardia with a chronic left bundle branch block. CT head was negative for skull fracture or ICH, notable for a right frontal scalp hematoma. CXR demonstrated a right basilar infiltrate and she was started on empiric ceftriaxone and azithromycin for pneumonia. Blood cultures were collected. Labs were notable for TSH within normal limits. POC CBG on arrival was 69. CBC was without leukocytosis and hemoglobin at baseline of 11. CMP revealed renal dysfunction at baseline with Scr 3.16 and BUN 79 (previously Scr 4.5 and BUN 77 three months ago), and mildly elevated  AST of 53 and ALT of 78. Potassium was 3.5 and sodium 143. Lipase was mildly elevated at 105. BNP was elevated at 1400. Magnesium 2.8. I-stat lactic acid was 0.73.   Meds:  No outpatient medications have been marked as taking for the 11/03/17 encounter [facility] Encounter).   Unable to confirm med list due to AMS. Husband unsure what medications patient is prescribed.   Allergies: Allergies as of 11/03/2017  . (Not on File)   No past medical history on file.  Family History:  Unable to obtain   Social History: Per husband, patient has never used tobacco products. She does not drink  or use illicit drugs.   Review of Systems: A complete ROS was negative except as per HPI.   Physical Exam: Blood pressure (!) 153/63, pulse (!) 49, temperature (!) 88.7 F (31.5 C), temperature source Temporal, resp. rate 18, height 5\' 5"  (1.651 m), weight 170 lb (77.1 kg), SpO2 99 %. Constitutional: NAD, appears comfortable HEENT: Large right frontal scalp hematoma with superficial abrasion  Neck: Supple, trachea midline.  Cardiovascular: Bradycardic but regular, no murmurs, rubs, or gallops.  Pulmonary/Chest: Right lower lobe crackles  Abdominal: Soft, non tender, non distended. +BS.  Extremities: Warm and well perfused. 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 No signs of erythema or purulence.  Neurological: A&Ox3, CN II - XII grossly intact.  Skin: No rashes or erythema  Psychiatric: Normal mood and affect   EKG: personally reviewed my interpretation is: Sinus bradycardia with LBBB (old)   CXR: personally reviewed my interpretation is: Right basilar opacity   Assessment & Plan by Problem:  Patient is a 72 yo F with pmhx of HTN, HLD, Type II DM, CKD V, CAD s/p CABG, chronic combined systolic and diastolic HF (EF 71-06%), and breast cancer with 2 weeks of generalized malaise, poor po intake, and hypoglycemia presenting after a fall this morning with AMS and head injury. Found to be hypoglycemia, hypothermic, and bradycardic.   Fall: with AMS. Patient is sleepy in the ED but awakes easily to voice and follows commands. CT head was negative for skull fracture or ICH. Husband is concerned the fall was secondary to hypoglycemia given her recurrent episodes over the past week.  -- Monitor q4h CBGs  -- Holding home 70/30 (unclear dosage) -- Low threshold to repeat CT head if patient becomes more lethargic or develops neurological deficits  -- Strict bed rest -- PT once AMS has resolved   Profound  Hypothermia: Rectal temp of 88.13F, repeat after bear hugger 1 and 1/2 hours later was T 88.26F. Infectious work up thus far with possible RLL pneumonia on CXR. Husband reports a mild chronic cough at baseline, clinically no other signs of PNA. Blood cultures are ordered. Unable to obtain UA and Urine culture thus far due to no UOP. Question if uremia can cause hypothermia?  -- Admit to step down  -- Continue bair hugger -- Monitor temp  -- Continue empiric ceftriaxone and azithromycin  -- F/u blood cultures  -- In & Out cath for UA and urine culture   Sinus Bradycardia: Possible due to profound hypothermia. She is asymptomatic. Husband denies history of bradycardia. PR inveral appears normal, no evidence of heart block.  -- Telemetry  -- Hold home coreg 25 mg BID -- Repeat AM EKG  -- Pacing if patient becomes symptomatic  -- Consider cardiology consult if persistent despite improvement  in temperature   CKD V approaching ESRD: Labs appears stable from last check three months ago, however patient and husband are reporting symptoms concerning for worsening symptomatic uremia (generalized malaise, poor appetite, nausea). BUN today is 79. Patient also reports an abrupt decline in UOP over the past two days.  -- Obtain UA  -- Strict I/Os -- Daily weights  -- Nephrology consult   Type II DM: Husband reports recurrent hypoglycemic episodes over the past few weeks in the setting of generalized malaise and poor appetite. Patient is prescribed sliding scale novolin 70/30, 30-65 units qAM and 30-35 units qPM.  -- Hold 70/30 insulin  -- SSI-sen q4h while NPO   CAD s/p CABG Ischemic cardiomyopathy (EF 45-50%) -- Holding home asa 81, isosorbide 10 mg, hydralazine 25 mg TID, and lasix 80 mg daily while NPO (unable to confirm med rec on admission).   HLD: -- Holding home lipitor 80 mg   Breast Cancer: ER+, s/p lumpectomy and adjuvant radiation therapy in 2015. -- Resume daily anastrozole 1 mg when  able to take PO   FEN: No fluids, replete lytes prn, NPO VTE ppx: SCDs Code Status: DNR  Dispo: Admit patient to Inpatient with expected length of stay greater than 2 midnights.  SignedVelna Ochs, MD 11/03/2017, 11:08 AM  Pager: [phone]

## 2017-11-03 NOTE — ED Triage Notes (Signed)
Patient presents to ED via [facility] states she was sleeping on the couch because of her back problems, she remembers having to go to the bathroom her husband states he heard her call for him and found her on the floor  Altered loc, patient had hit her head, has hematoma on forehead and abrasion to her nose.

## 2017-11-03 NOTE — ED Notes (Signed)
Spoke with admitting/rounding MD in regards to insulin coverage.  MD states to hold coverage at this time due to pt initially presenting with hypoglycemia.

## 2017-11-03 NOTE — Progress Notes (Signed)
Orthopedic Tech Progress Note Patient Details:  [name] [birth year] [id number]  Patient ID: [name], female   DOB: 07-06-1945, 72 y.o.   MRN: [id number]   [name] 11/03/2017, 8:16 AM Made lev el 2 trauma visit

## 2017-11-03 NOTE — ED Notes (Signed)
Pt urinated but sample contaminated.  Pt informed of needing UA

## 2017-11-03 NOTE — ED Notes (Signed)
Husband states she is suppose to be going on dialysis soon,

## 2017-11-03 NOTE — Progress Notes (Signed)
Subjective: 72 year old female presents the office for follow-up evaluation of wound on the left third toe.  She states that she been using the calcium alginate dressing to the area daily.  She denies any increase in swelling or redness.  Is difficult for her to see the toes that she is not sure how is doing but she has not noticed any pus coming from the area or any increase in pain.  She is wearing a surgical shoe.  She has no other concerns today.  Denies any systemic complaints such as fevers, chills, nausea, vomiting. No acute changes since last appointment, and no other complaints at this time.   Objective: AAO x3, NAD DP/PT pulses palpable bilaterally, CRT less than 3 seconds Along the plantar aspect the left third toe on the PIPJ on the sulcus the wound appears to be more superficial today and continues to heal.  There was some tenderness identified in the wound which is loose I did debride this today.  I debrided the wound to healthy, granular tissue.  Overall the wound is getting smaller measures approximately 0.8 x 0.5 cm there is no probing to bone, undermining or tunneling.  There is mild swelling to the toe and mild erythema but there is no increase in warmth or ascending sialitis.  There is no drainage or pus or fluctuation or crepitation or malodor. No open lesions or pre-ulcerative lesions.  No pain with calf compression, swelling, warmth, erythema  Assessment: Ulceration left third toe  Plan: -All treatment options discussed with the patient including all alternatives, risks, complications.  -Today I did utilize a #312 blade scalpel after the area was cleaned with alcohol to debride the wound to healthy, granular tissue.  She is to continue daily dressing changes as we discussed.  Continue offloading at all times.  Continue with surgical shoe. -Monitor for any clinical signs or symptoms of infection and directed to call the office immediately should any occur or go to the  ER. -Patient encouraged to call the office with any questions, concerns, change in symptoms.   *X-ray next appointment  [name] DPM

## 2017-11-03 NOTE — ED Notes (Signed)
Pt CBG was 131, notified Cindy(RN)

## 2017-11-04 ENCOUNTER — Telehealth: Payer: Self-pay | Admitting: Podiatry

## 2017-11-04 DIAGNOSIS — J90 Pleural effusion, not elsewhere classified: Secondary | ICD-10-CM

## 2017-11-04 DIAGNOSIS — R4182 Altered mental status, unspecified: Secondary | ICD-10-CM

## 2017-11-04 DIAGNOSIS — I120 Hypertensive chronic kidney disease with stage 5 chronic kidney disease or end stage renal disease: Secondary | ICD-10-CM

## 2017-11-04 LAB — CBC
HCT: 30.9 % — ABNORMAL LOW (ref 36.0–46.0)
Hemoglobin: 9.8 g/dL — ABNORMAL LOW (ref 12.0–15.0)
MCH: 28.2 pg (ref 26.0–34.0)
MCHC: 31.7 g/dL (ref 30.0–36.0)
MCV: 88.8 fL (ref 78.0–100.0)
PLATELETS: 232 10*3/uL (ref 150–400)
RBC: 3.48 MIL/uL — ABNORMAL LOW (ref 3.87–5.11)
RDW: 16.7 % — ABNORMAL HIGH (ref 11.5–15.5)
WBC: 9.9 10*3/uL (ref 4.0–10.5)

## 2017-11-04 LAB — BASIC METABOLIC PANEL
Anion gap: 9 (ref 5–15)
BUN: 79 mg/dL — AB (ref 8–23)
CO2: 26 mmol/L (ref 22–32)
Calcium: 9.2 mg/dL (ref 8.9–10.3)
Chloride: 105 mmol/L (ref 98–111)
Creatinine, Ser: 3.14 mg/dL — ABNORMAL HIGH (ref 0.44–1.00)
GFR calc Af Amer: 16 mL/min — ABNORMAL LOW (ref >60)
GFR calc non Af Amer: 14 mL/min — ABNORMAL LOW (ref >60)
Glucose, Bld: 193 mg/dL — ABNORMAL HIGH (ref 70–99)
Potassium: 3.8 mmol/L (ref 3.5–5.1)
Sodium: 140 mmol/L (ref 135–145)

## 2017-11-04 LAB — URINE CULTURE: Culture: NO GROWTH

## 2017-11-04 LAB — GLUCOSE, CAPILLARY
GLUCOSE-CAPILLARY: 168 mg/dL — AB (ref 70–99)
GLUCOSE-CAPILLARY: 219 mg/dL — AB (ref 70–99)
GLUCOSE-CAPILLARY: 177 mg/dL — AB (ref 70–99)
Glucose-Capillary: 175 mg/dL — ABNORMAL HIGH (ref 70–99)
Glucose-Capillary: 179 mg/dL — ABNORMAL HIGH (ref 70–99)
Glucose-Capillary: 194 mg/dL — ABNORMAL HIGH (ref 70–99)

## 2017-11-04 MED ORDER — INSULIN GLARGINE 100 UNIT/ML ~~LOC~~ SOLN
2.0000 [IU] | Freq: Every day | SUBCUTANEOUS | Status: DC
  Filled 2017-11-04: qty 0.02

## 2017-11-04 MED ORDER — BUMETANIDE 2 MG PO TABS
2.0000 mg | ORAL_TABLET | Freq: Two times a day (BID) | ORAL | Status: DC
  Administered 2017-11-04 – 2017-11-05 (×2): 2 mg via ORAL
  Filled 2017-11-04 (×3): qty 1

## 2017-11-04 MED ORDER — ATORVASTATIN CALCIUM 80 MG PO TABS
80.0000 mg | ORAL_TABLET | Freq: Every day | ORAL | Status: DC
  Administered 2017-11-04: 80 mg via ORAL
  Filled 2017-11-04: qty 1

## 2017-11-04 MED ORDER — INSULIN GLARGINE 100 UNIT/ML ~~LOC~~ SOLN
5.0000 [IU] | Freq: Every day | SUBCUTANEOUS | Status: DC
  Administered 2017-11-04 – 2017-11-05 (×2): 5 [IU] via SUBCUTANEOUS
  Filled 2017-11-04 (×2): qty 0.05

## 2017-11-04 MED ORDER — CARVEDILOL 25 MG PO TABS
25.0000 mg | ORAL_TABLET | Freq: Two times a day (BID) | ORAL | Status: DC
  Administered 2017-11-04 – 2017-11-05 (×2): 25 mg via ORAL
  Filled 2017-11-04 (×2): qty 1

## 2017-11-04 MED ORDER — ALLOPURINOL 100 MG PO TABS
100.0000 mg | ORAL_TABLET | Freq: Two times a day (BID) | ORAL | Status: DC
  Administered 2017-11-04 – 2017-11-05 (×3): 100 mg via ORAL
  Filled 2017-11-04 (×3): qty 1

## 2017-11-04 NOTE — Progress Notes (Signed)
Subjective:  Feeling much improved today.  After I/O catheter and BM she feels that her abd distention is much improved.  No new issues.   Objective Vital signs in last 24 hours: Vitals:   11/03/17 2021 11/04/17 0000 11/04/17 0437 11/04/17 1436  BP: (!) 163/69 (!) 168/62 (!) 140/56 (!) 168/60  Pulse: 81 79 76 74  Resp: 20 20 (!) 22 18  Temp: 98.8 F (37.1 C) 97.8 F (36.6 C) 98.8 F (37.1 C) 98 F (36.7 C)  TempSrc: Oral Oral Oral Oral  SpO2: 98% 97% 98% 96%  Weight:   81.6 kg (179 lb 14.4 oz)   Height:       Weight change:   Intake/Output Summary (Last 24 hours) at 11/04/2017 1440 Last data filed at 11/04/2017 1003 Gross per 24 hour  Intake 1020 ml  Output 975 ml  Net 45 ml    Assessment/Plan: 1.  CKD IV - renal function stable and she has no uremic symptoms.  Agree with reinitiation of diuretic and I think bumex is a good idea.  Discussed Na/fluid restriction today.  Sounds like she's <2g/day and already ~48oz/day and I think that's very reasonable.  She has plans already to have her AV access surgery rescheduled with a new surgeon (prior one is leaving).  Check PVR today given feeling abdominal distention improved after I/O cath in ED.  F/u with Dr. Hollie Salk in about a week already scheduled.    2.  HTN - currently a bit hypertensive, continue home medications and follow as she diureses.  The primary team is being cautious for orthostatic drops and I think that's a great idea.    3.  Anemic of CKD - mild, doesn't need ESA  4.  Secondary HyperPTH - PTH in 07/2017 was 147.  Ca ok, phos 5.9 on 7/30.    Justin Mend    Labs: Basic Metabolic Panel: Recent Labs  Lab 11/03/17 0828 11/03/17 1737 11/04/17 0511  NA 143  --  140  K 3.5  --  3.8  CL 108  --  105  CO2 23  --  26  GLUCOSE 63*  --  193*  BUN 79*  --  79*  CREATININE 3.16*  --  3.14*  CALCIUM 9.3  --  9.2  PHOS  --  5.9*  --    Liver Function Tests: Recent Labs  Lab 11/03/17 0828  AST 53*  ALT 78*   ALKPHOS 117  BILITOT 0.7  PROT 6.4*  ALBUMIN 3.3*   Recent Labs  Lab 11/03/17 0828  LIPASE 105*   No results for input(s): AMMONIA in the last 168 hours. CBC: Recent Labs  Lab 11/03/17 0828 11/04/17 0511  WBC 5.8 9.9  NEUTROABS 4.3  --   HGB 11.1* 9.8*  HCT 36.4 30.9*  MCV 90.5 88.8  PLT 210 232   Cardiac Enzymes: No results for input(s): CKTOTAL, CKMB, CKMBINDEX, TROPONINI in the last 168 hours. CBG: Recent Labs  Lab 11/03/17 2018 11/04/17 0029 11/04/17 0434 11/04/17 0653 11/04/17 1205  GLUCAP 176* 175* 179* 168* 219*    Iron Studies: No results for input(s): IRON, TIBC, TRANSFERRIN, FERRITIN in the last 72 hours. Studies/Results: Dg Chest 2 View  Result Date: 11/03/2017 CLINICAL DATA:  Follow-up infiltrate EXAM: CHEST - 2 VIEW COMPARISON:  Earlier study of 11/03/2017 FINDINGS: Enlargement of cardiac silhouette post CABG. Atherosclerotic calcification aorta. Mediastinal contours normal. Slight pulmonary vascular congestion. Improved interstitial edema since the earlier study. Persistent RIGHT pleural effusion  and basilar atelectasis. No pneumothorax. Bones demineralized. IMPRESSION: Enlargement of cardiac silhouette post CABG with pulmonary vascular congestion. Slightly improved pulmonary edema. Persistent RIGHT pleural effusion and basilar atelectasis. Electronically Signed   By: Lavonia Dana M.D.   On: 11/03/2017 18:52   Ct Head Wo Contrast  Result Date: 11/03/2017 CLINICAL DATA:  Fall.  Altered mental status. EXAM: CT HEAD WITHOUT CONTRAST TECHNIQUE: Contiguous axial images were obtained from the base of the skull through the vertex without intravenous contrast. COMPARISON:  None. FINDINGS: Brain: There is no evidence of acute infarct, intracranial hemorrhage, mass, midline shift, or extra-axial fluid collection. There is a small left cerebellar infarct with volume loss compatible with a chronic infarct. Bilateral basal ganglia lacunar infarcts are also most likely  chronic. Patchy periventricular white matter hypodensities are nonspecific but compatible with mild chronic small vessel ischemic disease. Vascular: Calcified atherosclerosis at the skull base. No hyperdense vessel. Skull: No fracture or suspicious osseous lesion. Moderate-sized right frontal scalp hematoma with underlying prominent vascular channel in the frontal bone. Sinuses/Orbits: Chronic sphenoid sinusitis with sinus expansion, wall thickening and sclerosis, and complex and partially calcified central material in the sinus. Milder mucosal thickening elsewhere in the paranasal sinuses. Clear mastoid air cells. Bilateral cataract extraction. Other: None. IMPRESSION: 1. No evidence of acute intracranial abnormality. 2. Right frontal scalp hematoma.  No skull fracture. 3. Small basal ganglia and cerebellar infarcts, likely chronic. 4. Mild chronic small vessel ischemic white matter disease. 5. Chronic sphenoid sinusitis. These results were called by telephone at the time of interpretation on 11/03/2017 at 9:16 am to Dr. Marda Stalker , who verbally acknowledged these results. Electronically Signed   By: Logan Bores M.D.   On: 11/03/2017 09:29   Dg Chest Portable 1 View  Result Date: 11/03/2017 CLINICAL DATA:  Shortness of breath and recent fall, initial encounter EXAM: PORTABLE CHEST 1 VIEW COMPARISON:  None. FINDINGS: Cardiac shadow is enlarged. Aortic calcifications are again seen. The lungs are well aerated bilaterally with patchy infiltrate in the right lung base. No sizable effusion is noted. No bony abnormality is seen. IMPRESSION: Right basilar infiltrate. Followup PA and lateral chest X-ray is recommended in 3-4 weeks following trial of antibiotic therapy to ensure resolution and exclude underlying malignancy. Electronically Signed   By: Inez Catalina M.D.   On: 11/03/2017 08:48   Medications: Infusions:   Scheduled Medications: . allopurinol  100 mg Oral BID  . atorvastatin  80 mg Oral  Daily  . bumetanide  2 mg Oral BID  . carvedilol  25 mg Oral BID WC  . insulin aspart  0-9 Units Subcutaneous Q4H  . insulin glargine  5 Units Subcutaneous Daily    have reviewed scheduled and prn medications.  Physical Exam:  General: sitting up in chair, appears much more energetic than yesterday HEENT: MMM Eyes: anicteric Heart: RRR, no rub or S4 Lungs: rales in bases, decreased BS in the Right, normal WOB Abdomen: nontender, mildly distended Extremities: 1+ RLE, 2+ pitting LLE edema Skin: no rashes Neuro: conversant, able to recall all details, no asterixis  11/04/2017,2:40 PM  LOS: 1 day

## 2017-11-04 NOTE — Progress Notes (Signed)
Subjective: [name] was feeling much better today. She was sitting comfortably in the chair for the duration of conversation with the team. She reported 3-4 hours of sleep without interruption which she feels is "good, for her". She was engaged and interested in the discussion of her care and was relieved to hear that pneumonia was not a current concern. We discussed her change in medications and her care track. She had no further questions at the end of the teams visit.   Review of Systems  Constitutional: Negative for chills and fever.  Respiratory: Positive for shortness of breath.   Cardiovascular: Positive for leg swelling. Negative for chest pain.  Gastrointestinal: Negative for abdominal pain, nausea and vomiting.  Musculoskeletal: Negative.   Neurological: Negative for loss of consciousness and headaches.   Objective: Vital signs in last 24 hours: Vitals:   11/03/17 2021 11/04/17 0000 11/04/17 0437 11/04/17 1436  BP: (!) 163/69 (!) 168/62 (!) 140/56 (!) 168/60  Pulse: 81 79 76 74  Resp: 20 20 (!) 22 18  Temp: 98.8 F (37.1 C) 97.8 F (36.6 C) 98.8 F (37.1 C) 98 F (36.7 C)  TempSrc: Oral Oral Oral Oral  SpO2: 98% 97% 98% 96%  Weight:   179 lb 14.4 oz (81.6 kg)   Height:       Weight change:   Intake/Output Summary (Last 24 hours) at 11/04/2017 1456 Last data filed at 11/04/2017 1003 Gross per 24 hour  Intake 1020 ml  Output 975 ml  Net 45 ml   PE:  General: NAD, appears comfortable,  HEENT: Large right frontal scalp hematoma, bilateral periorbital echinosis more pronounced on the right Neck: Supple  Cardiovascular: RRR,  S1 and S2 present. No murmurs, rubs, or gallops.  Pulmonary/Chest: Subtle lower lobe crackles, more prominent on the right. Decreased breath sounds on right. Normal WOB.   Extremities: Warm and well perfused. 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  Distal pulses intact bilaterally.  Neurological: A&Ox3  Skin: No rashes or erythema  Psychiatric: Normal mood and affect  Lab Results: Results for orders placed or performed during the hospital encounter of 11/03/17 (from the past 24 hour(s))  CBG monitoring, ED     Status: Abnormal   Collection Time: 11/03/17  4:05 PM  Result Value Ref Range   Glucose-Capillary 131 (H) 70 - 99 mg/dL   Comment 1 Notify RN    Comment 2 Document in Chart   MRSA PCR Screening     Status: None   Collection Time: 11/03/17  5:05 PM  Result Value Ref Range   MRSA by PCR NEGATIVE NEGATIVE  Phosphorus     Status: Abnormal   Collection Time: 11/03/17  5:37 PM  Result Value Ref Range   Phosphorus 5.9 (H) 2.5 - 4.6 mg/dL  Glucose, capillary     Status: Abnormal   Collection Time: 11/03/17  8:18 PM  Result Value Ref Range   Glucose-Capillary 176 (H) 70 - 99 mg/dL  Glucose, capillary     Status: Abnormal   Collection Time: 11/04/17 12:29 AM  Result Value Ref Range   Glucose-Capillary 175 (H) 70 - 99 mg/dL  Glucose, capillary     Status: Abnormal   Collection Time: 11/04/17  4:34 AM  Result Value Ref Range   Glucose-Capillary 179 (H) 70 - 99 mg/dL  Basic metabolic panel     Status: Abnormal   Collection  Time: 11/04/17  5:11 AM  Result Value Ref Range   Sodium 140 135 - 145 mmol/L   Potassium 3.8 3.5 - 5.1 mmol/L   Chloride 105 98 - 111 mmol/L   CO2 26 22 - 32 mmol/L   Glucose, Bld 193 (H) 70 - 99 mg/dL   BUN 79 (H) 8 - 23 mg/dL   Creatinine, Ser 3.14 (H) 0.44 - 1.00 mg/dL   Calcium 9.2 8.9 - 10.3 mg/dL   GFR calc non Af Amer 14 (L) >60 mL/min   GFR calc Af Amer 16 (L) >60 mL/min   Anion gap 9 5 - 15  CBC     Status: Abnormal   Collection Time: 11/04/17  5:11 AM  Result Value Ref Range   WBC 9.9 4.0 - 10.5 K/uL   RBC 3.48 (L) 3.87 - 5.11 MIL/uL   Hemoglobin 9.8 (L) 12.0 - 15.0 g/dL   HCT 30.9 (L) 36.0 - 46.0 %   MCV 88.8 78.0 - 100.0 fL   MCH 28.2 26.0 - 34.0 pg   MCHC 31.7 30.0 - 36.0 g/dL    RDW 16.7 (H) 11.5 - 15.5 %   Platelets 232 150 - 400 K/uL  Glucose, capillary     Status: Abnormal   Collection Time: 11/04/17  6:53 AM  Result Value Ref Range   Glucose-Capillary 168 (H) 70 - 99 mg/dL  Glucose, capillary     Status: Abnormal   Collection Time: 11/04/17 12:05 PM  Result Value Ref Range   Glucose-Capillary 219 (H) 70 - 99 mg/dL   Micro Results: Recent Results (from the past 240 hour(s))  Blood culture (routine x 2)     Status: None (Preliminary result)   Collection Time: 11/03/17  8:29 AM  Result Value Ref Range Status   Specimen Description BLOOD RIGHT ANTECUBITAL  Final   Special Requests   Final    BOTTLES DRAWN AEROBIC AND ANAEROBIC Blood Culture adequate volume   Culture   Final    NO GROWTH 1 DAY Performed at [facility], [address] [place], [place] [address]    Report Status PENDING  Incomplete  Blood culture (routine x 2)     Status: None (Preliminary result)   Collection Time: 11/03/17  9:29 AM  Result Value Ref Range Status   Specimen Description BLOOD WRIST RIGHT  Final   Special Requests   Final    BOTTLES DRAWN AEROBIC ONLY Blood Culture adequate volume   Culture   Final    NO GROWTH 1 DAY Performed at [facility], [place] [address], [place], [place] [address]    Report Status PENDING  Incomplete  Urine culture     Status: None   Collection Time: 11/03/17  1:50 PM  Result Value Ref Range Status   Specimen Description URINE, CATHETERIZED  Final   Special Requests NONE  Final   Culture   Final    NO GROWTH Performed at [facility], [place] [address], [place], [place] [address]    Report Status 11/04/2017 FINAL  Final  MRSA PCR Screening     Status: None   Collection Time: 11/03/17  5:05 PM  Result Value Ref Range Status   MRSA by PCR NEGATIVE NEGATIVE Final    Comment:        The GeneXpert MRSA Assay (FDA approved for NASAL specimens only), is one component of a comprehensive MRSA colonization surveillance program.  It is not intended to diagnose MRSA infection  nor to guide or monitor treatment for MRSA infections. Performed at [facility], [place] [address] [place], [place] [address]    Studies/Results: Dg Chest 2 View  Result Date: 11/03/2017 CLINICAL DATA:  Follow-up infiltrate EXAM: CHEST - 2 VIEW COMPARISON:  Earlier study of 11/03/2017 FINDINGS: Enlargement of cardiac silhouette post CABG. Atherosclerotic calcification aorta. Mediastinal contours normal. Slight pulmonary vascular congestion. Improved interstitial edema since the earlier study. Persistent RIGHT pleural effusion and basilar atelectasis. No pneumothorax. Bones demineralized. IMPRESSION: Enlargement of cardiac silhouette post CABG with pulmonary vascular congestion. Slightly improved pulmonary edema. Persistent RIGHT pleural effusion and basilar atelectasis. Electronically Signed   By: Lavonia Dana M.D.   On: 11/03/2017 18:52   Ct Head Wo Contrast  Result Date: 11/03/2017 CLINICAL DATA:  Fall.  Altered mental status. EXAM: CT HEAD WITHOUT CONTRAST TECHNIQUE: Contiguous axial images were obtained from the base of the skull through the vertex without intravenous contrast. COMPARISON:  None. FINDINGS: Brain: There is no evidence of acute infarct, intracranial hemorrhage, mass, midline shift, or extra-axial fluid collection. There is a small left cerebellar infarct with volume loss compatible with a chronic infarct. Bilateral basal ganglia lacunar infarcts are also most likely chronic. Patchy periventricular white matter hypodensities are nonspecific but compatible with mild chronic small vessel ischemic disease. Vascular: Calcified atherosclerosis at the skull base. No hyperdense vessel. Skull: No fracture or suspicious osseous lesion. Moderate-sized right frontal scalp hematoma with underlying prominent vascular channel in the frontal bone. Sinuses/Orbits: Chronic sphenoid sinusitis with sinus expansion, wall thickening and sclerosis, and  complex and partially calcified central material in the sinus. Milder mucosal thickening elsewhere in the paranasal sinuses. Clear mastoid air cells. Bilateral cataract extraction. Other: None. IMPRESSION: 1. No evidence of acute intracranial abnormality. 2. Right frontal scalp hematoma.  No skull fracture. 3. Small basal ganglia and cerebellar infarcts, likely chronic. 4. Mild chronic small vessel ischemic white matter disease. 5. Chronic sphenoid sinusitis. These results were called by telephone at the time of interpretation on 11/03/2017 at 9:16 am to Dr. Marda Stalker , who verbally acknowledged these results. Electronically Signed   By: Logan Bores M.D.   On: 11/03/2017 09:29   Dg Chest Portable 1 View  Result Date: 11/03/2017 CLINICAL DATA:  Shortness of breath and recent fall, initial encounter EXAM: PORTABLE CHEST 1 VIEW COMPARISON:  None. FINDINGS: Cardiac shadow is enlarged. Aortic calcifications are again seen. The lungs are well aerated bilaterally with patchy infiltrate in the right lung base. No sizable effusion is noted. No bony abnormality is seen. IMPRESSION: Right basilar infiltrate. Followup PA and lateral chest X-ray is recommended in 3-4 weeks following trial of antibiotic therapy to ensure resolution and exclude underlying malignancy. Electronically Signed   By: Inez Catalina M.D.   On: 11/03/2017 08:48   Medications: I have reviewed the patient's current medications. Scheduled Meds: . allopurinol  100 mg Oral BID  . atorvastatin  80 mg Oral Daily  . bumetanide  2 mg Oral BID  . carvedilol  25 mg Oral BID WC  . insulin aspart  0-9 Units Subcutaneous Q4H  . 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   #AMS: Resolved. Patient was sleepy upon presentation but quickly regained mental clarity with glucose stabilization and active rewarming. CT head was negative for skull fracture or ICH. Likely driven by hypoglycemia as glucose has been unstable and trending low with fasting.  -- Monitor q4h CBGs  -- Holding home 70/30 (unclear dosage) -- Lantus 5u daily  -- Insulin aspart 0-9u correctional  -- PT consult   #Profound Hypothermia: Resolved. Rectal temp of 88.84F on presentation, repeat after bear hugger 1 and 1/2 hours later was T 88.47F. No clear initial cause. Hypoglycemia likely contributing factor. Infectious work up including CXR showing possible RLL pneumonia, later read to be pulmonary edema 2/2 fluid status. UA, blood cultures ordered, negative on 7/31.   -- Admit to step down  -- Monitor temp  -- F/u blood cultures (initial read no growth 7/31)   #Sinus Bradycardia: Resolved. Possible due to profound hypothermia. She is asymptomatic. Husband denies history of bradycardia. PR inveral appears normal, no evidence of heart block.  -- Telemetry  -- Pacing if patient becomes symptomatic   #CKD V approaching ESRD: Labs appears stable from last check three months ago, however patient and husband are reporting symptoms concerning for worsening symptomatic uremia (generalized malaise, poor appetite, nausea). BUN today is 79. Nephrology consulted and does not feel uremia played a roll in AMS, does not view her as a current candidate for dialysis.  -- Bumex 2mg  BID  -- Goal fluid intake 48oz/ day  -- Strict I/Os, Daily weights   Type II DM: Husband reports recurrent hypoglycemic episodes over the past few weeks in the setting of generalized malaise and poor appetite. Patient was prescribed sliding scale novolin 70/30, 30-65 units qAM and 30-35 units qPM. novolin 70/30 discontinued and regiment changed to  dedicated long acting and insulin aspart on 7/31.  - Lantus 5u qhs - Insulin aspart 0-9u with breakfast   - q4h CBGs   CAD s/p CABG  HLD: Ischemic cardiomyopathy (EF 45-50%) -- Holding home asa 81, isosorbide 10 mg, hydralazine 25 mg TID and lipitor 80 mg   Breast Cancer: ER+, s/p lumpectomy and adjuvant radiation therapy in 2015. -- Holding anastrozole 1 mg    This is a Careers information officer Note.  The care of the patient was discussed with Dr. Koleen Distance and the assessment and plan formulated with their assistance.  Please see their attached note for official documentation of the daily encounter.   LOS: 1 day   Clearnce Hasten, Medical Student 11/04/2017, 2:56 PM

## 2017-11-04 NOTE — Consult Note (Signed)
[facility] Nurse wound consult note Evaluation completed in [place]. No family present. Reason for Consult: Left third toe wound Wound type: The area was caused by trauma to the toe from a dressing the patient had applied prior to arrival to the hospital.  The patient has been being followed [facility]. In the crease of the left third toe there is a fissure. There is no erythema, induration, and minimal moisture.  The toe did not have a dressing on at the time of my evaluation.  I have ordered two pieces of Aquacel Ag+ Kellie Simmering [phone]) and provided all other wound care items.  We will continue the plan of care the patient was following prior to hospitalization. The patient prefers to perform her own dressing changes. Allow patient to perform the following care to her left 3rd toe:  Cleanse with normal saline. Pat dry. Cut a small piece of silver hydrofiber (Aquacel Ag+) to place to the plantar surface. Wrap in gauze. Tape in place. Change daily. Monitor the wound area(s) for worsening of condition such as: Signs/symptoms of infection,  Increase in size,  Development of or worsening of odor, Development of pain, or increased pain at the affected locations.  Notify the medical team if any of these develop.  Thank you for the consult.  Discussed plan of care with the patient and bedside nurse.  [facility] nurse will not follow at this time.  Please re-consult the [facility] team if needed.  Val Riles, RN, MSN, CWOCN, CNS-BC, pager [phone]

## 2017-11-04 NOTE — Progress Notes (Signed)
On cal MD notified of continues elevated BP. No new orders received.

## 2017-11-04 NOTE — Progress Notes (Signed)
Subjective: [name] was seen and evaluated at bedside this morning. She was sitting up in her chair and feels much better today. Patient requested to work with PT today. We discussed switching her fluid pills to Bumex since she has noticed that Lasix hasn't been working well recently. We also discussed switching her insulin regimen since she seems to be having several hypoglycemic events which is likely what caused her to be so hypothermic and altered yesterday. Denies headaches, light headedness, chest pain, palpitations, abdominal pain, n/v.   Objective:  Vital signs in last 24 hours: Vitals:   11/03/17 2021 11/04/17 0000 11/04/17 0437 11/04/17 1436  BP: (!) 163/69 (!) 168/62 (!) 140/56 (!) 168/60  Pulse: 81 79 76 74  Resp: 20 20 (!) 22 18  Temp: 98.8 F (37.1 C) 97.8 F (36.6 C) 98.8 F (37.1 C) 98 F (36.7 C)  TempSrc: Oral Oral Oral Oral  SpO2: 98% 97% 98% 96%  Weight:   179 lb 14.4 oz (81.6 kg)   Height:       Physical exam: General: awake, alert pleasant female sitting up in her chair in NAD HEENT: large right frontal scalp hematoma, bilateral periorbital ecchymosis that is more pronounced on the right. 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 AMS after a mechanical fall in her home - on arrival patient was profoundly somnolent, hypothermic, and bradycardic - CT head was negative for skull fracture or intracranial hemorrhage.  - at appears her presentation was due to a hypoglycemic event which she states has been occurring intermittently over the last 2 weeks since she has been eating and drinking less - her temperature has returned to 98  degrees, and her heart rate has stabilized into the 70s since her blood glucose levels have stabilized - holding home Novolin 70/30 - have started her on Lantus 5 daily with sensitive sliding scale at meals - PT consult   2. CKD V approaching ESRD - patient is volume overloaded on exam and as evidenced on CXR showing pulmonary edema and right pleural effusion; no evident infiltrate concerning for pneumonia so we have discontinued abx. - there was concern that her 2 week history of nausea and generalized malaise might be due to uremia but patient's BUN is stable - patient reports she has not been diuresing well at home despite being on 80 mg Lasix BID; we have switched her to Bumex today and will monitor urine output and creatinine - nephrology has seen her and agree with current plan; there is no need for urgent; she already has plans in place for outpatient AV access surgery - strict I&Os - daily weights   3. HTN: slightly hypertensive today in the setting of holding all her home medications on admission due to AMS and concern for hypotension - have resumed home Coreg; diuresing will also likely help her blood pressure   4. DM: history of hypoglycemic episodes at home leading to mechanical fall and AMS on presentation  - since BG levels have stabilized, have started her on Lantus 5 units daily and sensitive sliding scale with meals.   6. CAD s/p CABG Ischemic cardiomyopathy (EF 45-50%) -  Have resumed coreg; holding aspirin, hydralazine, and isosorbide nitrate  7. HLD  - resumed Lipitor   Dispo: Anticipated discharge in approximately 2-3 day(s).   Modena Nunnery D, DO 11/04/2017, 4:56 PM Pager: [phone]

## 2017-11-04 NOTE — Telephone Encounter (Signed)
Patient wants to make you aware that she is currently admitted in [facility] in room [phone]. She fell and hit her head on the floor after blacking out and she has pneumonia. She still has the ulcer on her toe and they want her to walk today. She was wondering if you could take a look at it.

## 2017-11-04 NOTE — Telephone Encounter (Addendum)
Left message on home phone informing pt, she would need to have her hospital doctor or nurse make the referral to [name] to come evaluate her toe, and the physical therapy to walk, would be fine in the surgical shoe, it would help move the fluid out of her body, and I would try to reach her on her mobile phone. Left previous home message on pt's mobile phone.

## 2017-11-04 NOTE — Progress Notes (Signed)
  Date: 11/04/2017  Patient name: MIYEKO MAHLUM  Medical record number: [id number]  Date of birth: 02-27-1946   I have seen and evaluated this patient and I have discussed the plan of care with the house staff. Please see their note for complete details. I concur with their findings with the following additions/corrections: LARANDA BURKEMPER was seen on AM rounds with team.  She is feeling much better today.  She has not had hypoglycemia since admission and has some hypoglycemia, which is preferable.  We discussed that it would be preferable to stop her 70/30 insulin and use Lantus augmented by preprandial NovoLog.  We also discussed 24-hour glucose monitoring and offered the professional monitor in [facility] if she would like to give this a try so that we can fine-tune her new insulin regimen.  She stated she would think about it.  I independently reviewed her chest x-ray, PA and lateral on the 30th, which is slightly rotated and shows persistent pulmonary edema in the right pleural effusion but no overt RLL infiltrate.  We again discussed that she did not have pneumonia.  She is volume overloaded due to her chronic kidney disease stage V and has not been diuresing well on her outpatient Lasix regimen.  We are changing her to Bumex and will be following her volume status.  Her chronic kidney disease is unchanged and we will follow her creatinine while we diurese her.  She has no acute need for hemodialysis.  We appreciate nephrology's input.  Bartholomew Crews, MD 11/04/2017, 4:36 PM

## 2017-11-05 DIAGNOSIS — Z91040 Latex allergy status: Secondary | ICD-10-CM

## 2017-11-05 DIAGNOSIS — R4182 Altered mental status, unspecified: Secondary | ICD-10-CM

## 2017-11-05 DIAGNOSIS — R001 Bradycardia, unspecified: Secondary | ICD-10-CM

## 2017-11-05 DIAGNOSIS — E162 Hypoglycemia, unspecified: Secondary | ICD-10-CM

## 2017-11-05 DIAGNOSIS — S0083XD Contusion of other part of head, subsequent encounter: Secondary | ICD-10-CM

## 2017-11-05 DIAGNOSIS — E8770 Fluid overload, unspecified: Secondary | ICD-10-CM

## 2017-11-05 DIAGNOSIS — C50919 Malignant neoplasm of unspecified site of unspecified female breast: Secondary | ICD-10-CM

## 2017-11-05 DIAGNOSIS — Z888 Allergy status to other drugs, medicaments and biological substances status: Secondary | ICD-10-CM

## 2017-11-05 DIAGNOSIS — W07XXXD Fall from chair, subsequent encounter: Secondary | ICD-10-CM

## 2017-11-05 DIAGNOSIS — S0003XD Contusion of scalp, subsequent encounter: Secondary | ICD-10-CM

## 2017-11-05 DIAGNOSIS — I25810 Atherosclerosis of coronary artery bypass graft(s) without angina pectoris: Secondary | ICD-10-CM

## 2017-11-05 DIAGNOSIS — R404 Transient alteration of awareness: Secondary | ICD-10-CM

## 2017-11-05 DIAGNOSIS — Z170 Estrogen receptor positive status [ER+]: Secondary | ICD-10-CM

## 2017-11-05 LAB — GLUCOSE, CAPILLARY
Glucose-Capillary: 126 mg/dL — ABNORMAL HIGH (ref 70–99)
Glucose-Capillary: 127 mg/dL — ABNORMAL HIGH (ref 70–99)
Glucose-Capillary: 167 mg/dL — ABNORMAL HIGH (ref 70–99)
Glucose-Capillary: 176 mg/dL — ABNORMAL HIGH (ref 70–99)

## 2017-11-05 LAB — BASIC METABOLIC PANEL
Anion gap: 8 (ref 5–15)
BUN: 81 mg/dL — AB (ref 8–23)
CO2: 27 mmol/L (ref 22–32)
Calcium: 9 mg/dL (ref 8.9–10.3)
Chloride: 106 mmol/L (ref 98–111)
Creatinine, Ser: 3.18 mg/dL — ABNORMAL HIGH (ref 0.44–1.00)
GFR calc Af Amer: 16 mL/min — ABNORMAL LOW (ref >60)
GFR calc non Af Amer: 14 mL/min — ABNORMAL LOW (ref >60)
GLUCOSE: 134 mg/dL — AB (ref 70–99)
Potassium: 3.7 mmol/L (ref 3.5–5.1)
Sodium: 141 mmol/L (ref 135–145)

## 2017-11-05 LAB — MAGNESIUM: Magnesium: 2.5 mg/dL — ABNORMAL HIGH (ref 1.7–2.4)

## 2017-11-05 MED ORDER — BUMETANIDE 2 MG PO TABS: 2.0000 mg | ORAL_TABLET | Freq: Two times a day (BID) | ORAL | 1 refills | Status: DC

## 2017-11-05 MED ORDER — INSULIN GLARGINE 100 UNIT/ML ~~LOC~~ SOLN: 5.0000 [IU] | Freq: Every day | SUBCUTANEOUS | 1 refills | Status: DC

## 2017-11-05 MED ORDER — INSULIN ASPART 100 UNIT/ML FLEXPEN: 3.0000 [IU] | PEN_INJECTOR | Freq: Every day | SUBCUTANEOUS | 1 refills | Status: DC

## 2017-11-05 NOTE — Discharge Summary (Signed)
Name: [name] MRN: [id number] DOB: May 16, 1945 72 y.o. PCP: Unk Pinto, MD  Date of Admission: 11/03/2017  8:04 AM Date of Discharge: 11/05/2017 Attending Physician: No att. providers found  Discharge Diagnosis: 1. AMS 2/2 Hypoglycemia   Discharge Medications: Allergies as of 11/05/2017      Reactions   Atenolol Other (See Comments)   Connection to gout ?   Latex Rash      Medication List    STOP taking these medications   aspirin-acetaminophen-caffeine 250-250-65 MG tablet Commonly known as:  EXCEDRIN MIGRAINE   furosemide 40 MG tablet Commonly known as:  LASIX   insulin NPH-regular Human (70-30) 100 UNIT/ML injection Commonly known as:  NOVOLIN 70/30     TAKE these medications   allopurinol 100 MG tablet Commonly known as:  ZYLOPRIM Take 100 mg by mouth 2 (two) times daily.   anastrozole 1 MG tablet Commonly known as:  ARIMIDEX Take 1 mg by mouth daily.   aspirin EC 81 MG tablet Take 81 mg by mouth daily.   atorvastatin 80 MG tablet Commonly known as:  LIPITOR Take 80 mg by mouth daily.   bumetanide 2 MG tablet Commonly known as:  BUMEX Take 1 tablet (2 mg total) by mouth 2 (two) times daily.   carboxymethylcellulose 0.5 % Soln Commonly known as:  REFRESH PLUS Place 1 drop into both eyes daily as needed (dry eyes).   carvedilol 25 MG tablet Commonly known as:  COREG Take 25 mg by mouth 2 (two) times daily.   hydrALAZINE 25 MG tablet Commonly known as:  APRESOLINE Take 75 mg by mouth 3 (three) times daily.   insulin aspart 100 UNIT/ML FlexPen Commonly known as:  NOVOLOG Inject 3 Units into the skin daily. With your largest meal of the day   insulin glargine 100 UNIT/ML injection Commonly known as:  LANTUS Inject 0.05 mLs (5 Units total) into the skin daily. Start taking on:  11/06/2017   isosorbide dinitrate 10 MG tablet Commonly known as:  ISORDIL Take 10 mg by mouth 3 (three) times daily.   OCUVITE EYE HEALTH FORMULA PO Take 1  tablet by mouth daily.   sodium chloride 0.65 % Soln nasal spray Commonly known as:  OCEAN Place 1 spray into both nostrils as needed for congestion.   Vitamin D (Ergocalciferol) 2000 units Caps Take 2,000 Units by mouth daily.       Disposition and follow-up:   Ms.[name] was discharged from [facility] in Good condition.  At the hospital follow up visit please address:  1.  New diabetes regiment: We discontinued 70/30 and started her on Lantus 5 units daily with 2 units Novolog before her largest meal of the day   Switched home lasix to bumex 2mg  BID because lasix was not diuresing effectively and bumex is easier on the kidneys  2.  Labs / imaging needed at time of follow-up: BMP to monitor renal function. Nearing need for dialysis. Has outpatient nephrology appt for possible AV fistula.  3.  Pending labs/ test needing follow-up: none   Follow-up Appointments: Follow-up Information    Unk Pinto, MD. Go on 11/26/2017.   Specialty:  Internal Medicine Why:  Please go to your already scheduled appointment  Contact information: [address] [place] [address] [phone]        Pamela Lips, MD Follow up on 11/09/2017.   Specialty:  Nephrology Why:  Please go to your already scheduled appointment  Contact  information: [address] [place] [address] [phone]           Hospital Course by problem list:  72yo F with type 2 diabetes on insulin, stage IV chronic kidney disease, coronary artery disease status post CABG, admitted on 7/30 after she fell off her couch at home and subsequently found to be somnolent, bradycardic, hypothermic and hypoglycemic.  Symptoms rapidly improved after resolution of hypoglycemia.  AMS 2/2 Hypoglycemia: Resolved. CT head negative for skull fracture or hemorrhage.  Initially concern for sepsis given AMS, hypoglycemia, hypothermia and bradycardia with chest x-ray showing possible  right lower lobe pneumonia.  This was later read to be pulmonary edema.  UA, blood cultures both negative. - Discontinued home 70/30 -START Lantus 5 units daily plus sliding scale insulin aspart with biggest meal of the day  CKD Stage IV: Stable. Discharge BUN 79 Creatinine 3.19 Nephrology consulted and does not recommend dialysis at this time. -Changed home lasix to Bumex 2 mg twice daily  Type II DM: Presented with 2 weeks of generalized malaise and poor appetite and recurrent hypoglycemic episodes.  Home regimen was sliding scale NovoLog 70/30, 30 to 65 units every morning and 30 to 35 units every evening.  We have discontinued this home regiment and switched to Lantus 5 units daily with sliding scale insulin aspart before meals.  Patient's sugars are well controlled on this regimen.  No hypoglycemia. -Lantus 5 units daily -Insulin aspart sliding scale before breakfast (biggest meal of the day)  CAD s/p CABG: Stable.  Ischemic cardiomyopathy (EF 45 to 50%) -Held home aspirin 81 mg, isosorbide 10 mg, hydralazine 25 mg 3 times daily and Lipitor 80 mg - Resumed above home meds on discharge  Breast Cancer: Status post lumpectomy and radiation in 2015. -Holding home anastrozole 1 mg - resumed above home med on discharge  Discharge Vitals:   BP (!) 131/49 (BP Location: Left Arm)   Pulse 70   Temp 98.7 F (37.1 C) (Oral)   Resp 18   Ht 5\' 5"  (1.651 m)   Wt 179 lb 12.8 oz (81.6 kg)   SpO2 93%   BMI 29.92 kg/m   Pertinent Labs, Studies, and Procedures:  CXR with pulmonary edema  Discharge Instructions: Discharge Instructions    Call MD for:  extreme fatigue   Complete by:  As directed    Call MD for:  persistant dizziness or light-headedness   Complete by:  As directed    Call MD for:  persistant nausea and vomiting   Complete by:  As directed    Diet - low sodium heart healthy   Complete by:  As directed    Increase activity slowly   Complete by:  As directed        Signed: Isabelle Course, MD 11/05/2017, 3:51 PM   Pager: [phone]

## 2017-11-05 NOTE — Progress Notes (Signed)
Subjective:  Feeling much improved today. Net negative 1L yesterday.  Appetite good.   Objective Vital signs in last 24 hours: Vitals:   11/04/17 2145 11/05/17 0004 11/05/17 0440 11/05/17 0727  BP: (!) 149/59 (!) 166/83 139/61 (!) 160/59  Pulse: 68 67 68 72  Resp: 17  18   Temp: 98.5 F (36.9 C) 98.4 F (36.9 C) 98.1 F (36.7 C) 98 F (36.7 C)  TempSrc: Oral Oral Oral Oral  SpO2: 97% 97% 97% 99%  Weight:   81.6 kg (179 lb 12.8 oz)   Height:       Weight change: 4.445 kg (9 lb 12.8 oz)  Intake/Output Summary (Last 24 hours) at 11/05/2017 0847 Last data filed at 11/05/2017 0738 Gross per 24 hour  Intake 660 ml  Output 1400 ml  Net -740 ml    Assessment/Plan: 1.  CKD IV - renal function stable and she has no uremic symptoms.  Agree with continuation of bumex 2 BID for gentle diuresis.  Discussed Na/fluid restriction today.  Sounds like she's <2g/day and already ~48oz/day and I think that's very reasonable.  She has plans already to have her AV access surgery rescheduled with a new surgeon (prior one is leaving).  Check PVR today given feeling abdominal distention improved after I/O cath in ED.  F/u with Dr. Hollie Salk in about a week already scheduled.    2.  HTN - currently a bit hypertensive, continue home medications and follow as she diureses.  The primary team is being cautious for orthostatic drops and I think that's a great idea.    3.  Anemic of CKD - mild, doesn't need ESA  4.  Secondary HyperPTH - PTH in 07/2017 was 147.  Ca ok, phos 5.9 on 7/30.    Justin Mend    Labs: Basic Metabolic Panel: Recent Labs  Lab 11/03/17 [phone] 11/03/17 1737 11/04/17 0511 11/05/17 0300  NA 143  --  140 141  K 3.5  --  3.8 3.7  CL 108  --  105 106  CO2 23  --  26 27  GLUCOSE 63*  --  193* 134*  BUN 79*  --  79* 81*  CREATININE 3.16*  --  3.14* 3.18*  CALCIUM 9.3  --  9.2 9.0  PHOS  --  5.9*  --   --    Liver Function Tests: Recent Labs  Lab 11/03/17 0828  AST 53*  ALT  78*  ALKPHOS 117  BILITOT 0.7  PROT 6.4*  ALBUMIN 3.3*   Recent Labs  Lab 11/03/17 0828  LIPASE 105*   No results for input(s): AMMONIA in the last 168 hours. CBC: Recent Labs  Lab 11/03/17 0828 11/04/17 0511  WBC 5.8 9.9  NEUTROABS 4.3  --   HGB 11.1* 9.8*  HCT 36.4 30.9*  MCV 90.5 88.8  PLT 210 232   Cardiac Enzymes: No results for input(s): CKTOTAL, CKMB, CKMBINDEX, TROPONINI in the last 168 hours. CBG: Recent Labs  Lab 11/04/17 1622 11/04/17 1952 11/05/17 0003 11/05/17 0438 11/05/17 0724  GLUCAP 177* 194* 167* 126* 127*    Iron Studies: No results for input(s): IRON, TIBC, TRANSFERRIN, FERRITIN in the last 72 hours. Studies/Results: Dg Chest 2 View  Result Date: 11/03/2017 CLINICAL DATA:  Follow-up infiltrate EXAM: CHEST - 2 VIEW COMPARISON:  Earlier study of 11/03/2017 FINDINGS: Enlargement of cardiac silhouette post CABG. Atherosclerotic calcification aorta. Mediastinal contours normal. Slight pulmonary vascular congestion. Improved interstitial edema since the earlier study. Persistent RIGHT pleural  effusion and basilar atelectasis. No pneumothorax. Bones demineralized. IMPRESSION: Enlargement of cardiac silhouette post CABG with pulmonary vascular congestion. Slightly improved pulmonary edema. Persistent RIGHT pleural effusion and basilar atelectasis. Electronically Signed   By: Lavonia Dana M.D.   On: 11/03/2017 18:52   Ct Head Wo Contrast  Result Date: 11/03/2017 CLINICAL DATA:  Fall.  Altered mental status. EXAM: CT HEAD WITHOUT CONTRAST TECHNIQUE: Contiguous axial images were obtained from the base of the skull through the vertex without intravenous contrast. COMPARISON:  None. FINDINGS: Brain: There is no evidence of acute infarct, intracranial hemorrhage, mass, midline shift, or extra-axial fluid collection. There is a small left cerebellar infarct with volume loss compatible with a chronic infarct. Bilateral basal ganglia lacunar infarcts are also most  likely chronic. Patchy periventricular white matter hypodensities are nonspecific but compatible with mild chronic small vessel ischemic disease. Vascular: Calcified atherosclerosis at the skull base. No hyperdense vessel. Skull: No fracture or suspicious osseous lesion. Moderate-sized right frontal scalp hematoma with underlying prominent vascular channel in the frontal bone. Sinuses/Orbits: Chronic sphenoid sinusitis with sinus expansion, wall thickening and sclerosis, and complex and partially calcified central material in the sinus. Milder mucosal thickening elsewhere in the paranasal sinuses. Clear mastoid air cells. Bilateral cataract extraction. Other: None. IMPRESSION: 1. No evidence of acute intracranial abnormality. 2. Right frontal scalp hematoma.  No skull fracture. 3. Small basal ganglia and cerebellar infarcts, likely chronic. 4. Mild chronic small vessel ischemic white matter disease. 5. Chronic sphenoid sinusitis. These results were called by telephone at the time of interpretation on 11/03/2017 at 9:16 am to Dr. Marda Stalker , who verbally acknowledged these results. Electronically Signed   By: Logan Bores M.D.   On: 11/03/2017 09:29   Medications: Infusions:   Scheduled Medications: . allopurinol  100 mg Oral BID  . atorvastatin  80 mg Oral Daily  . bumetanide  2 mg Oral BID  . carvedilol  25 mg Oral BID WC  . insulin aspart  0-9 Units Subcutaneous Q4H  . insulin glargine  5 Units Subcutaneous Daily    have reviewed scheduled and prn medications.  Physical Exam:  General: sitting up in chair, appears much more energetic than yesterday HEENT: MMM Eyes: anicteric Heart: RRR, no rub or S4 Lungs: rales in bases, decreased BS in the Right, normal WOB Abdomen: nontender, mildly distended Extremities: 1+ RLE, 2+ pitting LLE edema Skin: no rashes Neuro: conversant, able to recall all details, no asterixis  11/05/2017,8:47 AM  LOS: 2 days

## 2017-11-05 NOTE — Progress Notes (Signed)
   Subjective: [name] is feeling well this morning and largely back to baseline. Her breathing as greatly improved and she was able to maintain O2 sats > 92% without supplemental O2 during my bedside evaluation this morning. She continues to have LEE and mild sob but is improving. She denies difficulty sleeping or pain. Appetite has been good. She feels ready for discharge home today.   Objective:  Vital signs in last 24 hours: Vitals:   11/05/17 0004 11/05/17 0440 11/05/17 0727 11/05/17 1114  BP: (!) 166/83 139/61 (!) 160/59 (!) 131/49  Pulse: 67 68 72 70  Resp:  18    Temp: 98.4 F (36.9 C) 98.1 F (36.7 C) 98 F (36.7 C) 98.7 F (37.1 C)  TempSrc: Oral Oral Oral Oral  SpO2: 97% 97% 99% 93%  Weight:  179 lb 12.8 oz (81.6 kg)    Height:       Gen: Well appearing, NAD, significant bruises under both eyes HEENT: large right frontal scalp hematoma, bilateral periorbital echinosis more pronounced on right, improving on left.  CV: well healed midline scar, RRR, no murmurs Pulm: Normal effort, bibasilar crackels, no wheezing Abd: Soft, NT, ND, normal BS.  Ext: Warm, bilateral 2+ pitting edema at ankles and 1+ at knees, normal joints. DP pulses symmetric. Healing left third toe ulcer.   Assessment/Plan: 72yo F with type 2 diabetes on insulin, stage IV chronic kidney disease, coronary artery disease status post CABG, admitted on 7/30 after she fell off her couch at home and subsequently found to be somnolent, bradycardic, hypothermic and hypoglycemic.  Symptoms rapidly improved after resolution of hypoglycemia.  Active Problems:   Altered mental status  AMS 2/2 Hypoglycemia: Resolved.  Glucose has been stable on 5 units Lantus daily and 2 units of NovoLog before meals.  CT head negative for skull fracture or hemorrhage.  Initially concern for sepsis given AMS, hypoglycemia, hypothermia and bradycardia with chest x-ray showing possible right lower lobe pneumonia.  This was later read  to be pulmonary edema.  UA, blood cultures both negative. -Telemetry -Hold home 70/30 -Continue Lantus 5 units daily plus sliding scale insulin -Monitor CBGs every 4 hours   CKD Stage IV: Stable.  BUN 79 Creatinine 3.19 Nephrology consulted and does not recommend dialysis at this time. -Bumex 2 mg twice daily -Daily weights   Type II DM: Presented with 2 weeks of generalized malaise and poor appetite and recurrent hypoglycemic episodes.  Home regimen was sliding scale NovoLog 70/30, 30 to 65 units every morning and 30 to 35 units every evening.  We have discontinued this home regiment and switched to Lantus 5 units daily with sliding scale insulin aspart before meals.  Patient's sugars are well controlled on this regimen.  No hypoglycemia. -Lantus 5 units daily -Insulin aspart sliding scale before breakfast (biggest meal of the day) -Every 4 hours CBGs   CAD s/p CABG: Stable.  Ischemic cardiomyopathy (EF 45 to 50%) -Holding home aspirin 81 mg, isosorbide 10 mg, hydralazine 25 mg 3 times daily and Lipitor 80 mg  Breast Cancer: Status post lumpectomy and radiation in 2015. -Holding home anastrozole 1 mg  Dispo: Anticipated discharge in approximately 0-1 day(s).   Pamela Course, MD 11/05/2017, 11:58 AM Pager: [phone]

## 2017-11-05 NOTE — Care Management Note (Signed)
Case Management Note  Patient Details  Name: [name] MRN: [id number] Date of Birth: 10-Nov-1945  Subjective/Objective:  Patient presented with an unwitnessed fall at home. Patient lives at home with spouse, independent with ADLs, ambulating with a "walking stick" and utilizing an ortho shoe for a necrotic toe. Patient reports managing wound care to her left 3rd toe at home, and [facility]. CM verified demographics/PCP Dr. Melford Aase; Pharmacy: [facility], [place], [place].                    Action/Plan: CM met with patient/spouse to eval for hospital transitional needs, with [facility] offered, with patient/spouse declining any needs at this time. CM will sign off.   Expected Discharge Date:  11/05/17               Expected Discharge Plan:  Home/Self Care  In-House Referral:  NA  Discharge planning Services  CM Consult  Post Acute Care Choice:  NA Choice offered to:  NA  DME Arranged:  N/A DME Agency:  NA  HH Arranged:  NA HH Agency:  NA  Status of Service:  Completed, signed off  If discussed at [place] of Stay Meetings, dates discussed:    Additional Comments:  Georgeanna Lea, RN 11/05/2017, 1:12 PM

## 2017-11-05 NOTE — Progress Notes (Signed)
Subjective: Mrs. Raysor seemed to be improved significantly from her last interaction with the team. She reports that she slept better than the night before, wasn't in any pain, and felt that the diuretics were working--something the lasix had not been doing for a couple of weeks. We discussed her care and talked about discontinuing her O2, which she tolerated well. She feels her breathing has improved from yesterday. After talking with the team, she had no further questions and was aware that she would likely be discharged today.   Review of Systems  Constitutional: Negative for chills and fever.  Respiratory: Positive for shortness of breath.   Cardiovascular: Positive for leg swelling. Negative for chest pain and orthopnea.  Gastrointestinal: Negative.   Skin: Negative for itching and rash.  Neurological: Negative.    Objective: Vital signs in last 24 hours: Vitals:   11/04/17 2145 11/05/17 0004 11/05/17 0440 11/05/17 0727  BP: (!) 149/59 (!) 166/83 139/61 (!) 160/59  Pulse: 68 67 68 72  Resp: 17  18   Temp: 98.5 F (36.9 C) 98.4 F (36.9 C) 98.1 F (36.7 C) 98 F (36.7 C)  TempSrc: Oral Oral Oral Oral  SpO2: 97% 97% 97% 99%  Weight:   179 lb 12.8 oz (81.6 kg)   Height:       Weight change: 9 lb 12.8 oz (4.445 kg)  Intake/Output Summary (Last 24 hours) at 11/05/2017 1026 Last data filed at 11/05/2017 0738 Gross per 24 hour  Intake 480 ml  Output 900 ml  Net -420 ml   BP (!) 160/59 (BP Location: Right Arm)   Pulse 72   Temp 98 F (36.7 C) (Oral)   Resp 18   Ht 5\' 5"  (1.651 m)   Wt 179 lb 12.8 oz (81.6 kg)   SpO2 99%   BMI 29.92 kg/m    PE:  General: NAD, appears comfortable,  HEENT:Large right frontal scalp hematoma, bilateral periorbital echinosis more pronounced on the right, improving on the left Neck: Supple  Cardiovascular:RRR,  S1 and S2 present. No murmurs, rubs, or gallops.  Pulmonary/Chest:Subtle lower lobe crackles. Normal WOB.   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 Distal pulses intact bilaterally.  Neurological:A&Ox3  Skin: No rashes or erythema  Psychiatric:Normal mood and affect   Lab Results: Results for orders placed or performed during the hospital encounter of 11/03/17 (from the past 24 hour(s))  Glucose, capillary     Status: Abnormal   Collection Time: 11/04/17 12:05 PM  Result Value Ref Range   Glucose-Capillary 219 (H) 70 - 99 mg/dL  Glucose, capillary     Status: Abnormal   Collection Time: 11/04/17  4:22 PM  Result Value Ref Range   Glucose-Capillary 177 (H) 70 - 99 mg/dL  Glucose, capillary     Status: Abnormal   Collection Time: 11/04/17  7:52 PM  Result Value Ref Range   Glucose-Capillary 194 (H) 70 - 99 mg/dL  Glucose, capillary     Status: Abnormal   Collection Time: 11/05/17 12:03 AM  Result Value Ref Range   Glucose-Capillary 167 (H) 70 - 99 mg/dL  Basic metabolic panel     Status: Abnormal   Collection Time: 11/05/17  3:00 AM  Result Value Ref Range   Sodium 141 135 - 145 mmol/L   Potassium 3.7 3.5 - 5.1 mmol/L   Chloride 106 98 - 111  mmol/L   CO2 27 22 - 32 mmol/L   Glucose, Bld 134 (H) 70 - 99 mg/dL   BUN 81 (H) 8 - 23 mg/dL   Creatinine, Ser 3.18 (H) 0.44 - 1.00 mg/dL   Calcium 9.0 8.9 - 10.3 mg/dL   GFR calc non Af Amer 14 (L) >60 mL/min   GFR calc Af Amer 16 (L) >60 mL/min   Anion gap 8 5 - 15  Magnesium     Status: Abnormal   Collection Time: 11/05/17  3:00 AM  Result Value Ref Range   Magnesium 2.5 (H) 1.7 - 2.4 mg/dL  Glucose, capillary     Status: Abnormal   Collection Time: 11/05/17  4:38 AM  Result Value Ref Range   Glucose-Capillary 126 (H) 70 - 99 mg/dL  Glucose, capillary     Status: Abnormal   Collection Time: 11/05/17  7:24 AM  Result Value Ref Range   Glucose-Capillary 127 (H) 70 - 99 mg/dL   Micro Results: Recent Results (from the past  240 hour(s))  Blood culture (routine x 2)     Status: None (Preliminary result)   Collection Time: 11/03/17  8:29 AM  Result Value Ref Range Status   Specimen Description BLOOD RIGHT ANTECUBITAL  Final   Special Requests   Final    BOTTLES DRAWN AEROBIC AND ANAEROBIC Blood Culture adequate volume   Culture   Final    NO GROWTH 1 DAY Performed at [facility], [address], [place], [place] [address]    Report Status PENDING  Incomplete  Blood culture (routine x 2)     Status: None (Preliminary result)   Collection Time: 11/03/17  9:29 AM  Result Value Ref Range Status   Specimen Description BLOOD WRIST RIGHT  Final   Special Requests   Final    BOTTLES DRAWN AEROBIC ONLY Blood Culture adequate volume   Culture   Final    NO GROWTH 1 DAY Performed at [facility], [place] [address] [place], [place] [address]    Report Status PENDING  Incomplete  Urine culture     Status: None   Collection Time: 11/03/17  1:50 PM  Result Value Ref Range Status   Specimen Description URINE, CATHETERIZED  Final   Special Requests NONE  Final   Culture   Final    NO GROWTH Performed at [name], [place] [address] [place], [place] [address]    Report Status 11/04/2017 FINAL  Final  MRSA PCR Screening     Status: None   Collection Time: 11/03/17  5:05 PM  Result Value Ref Range Status   MRSA by PCR NEGATIVE NEGATIVE Final    Comment:        The GeneXpert MRSA Assay (FDA approved for NASAL specimens only), is one component of a comprehensive MRSA colonization surveillance program. It is not intended to diagnose MRSA infection nor to guide or monitor treatment for MRSA infections. Performed at [facility], [place] [address], [place], [place] [address]    Studies/Results: Dg Chest 2 View  Result Date: 11/03/2017 CLINICAL DATA:  Follow-up infiltrate EXAM: CHEST - 2 VIEW COMPARISON:  Earlier study of 11/03/2017 FINDINGS: Enlargement of cardiac silhouette post CABG.  Atherosclerotic calcification aorta. Mediastinal contours normal. Slight pulmonary vascular congestion. Improved interstitial edema since the earlier study. Persistent RIGHT pleural effusion and basilar atelectasis. No pneumothorax. Bones demineralized. IMPRESSION: Enlargement of cardiac silhouette post CABG with pulmonary vascular congestion. Slightly improved pulmonary  edema. Persistent RIGHT pleural effusion and basilar atelectasis. Electronically Signed   By: Lavonia Dana M.D.   On: 11/03/2017 18:52   Medications: I have reviewed the patient's current medications. Scheduled Meds: . allopurinol  100 mg Oral BID  . atorvastatin  80 mg Oral Daily  . bumetanide  2 mg Oral BID  . carvedilol  25 mg Oral BID WC  . insulin aspart  0-9 Units Subcutaneous Q4H  . 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   #AMS: Resolved. Patient was sleepy upon presentation but quickly regained mental clarity with glucose stabilization and active rewarming. CT head was negative for skull fracture or ICH. Likely driven by hypoglycemia as glucose has been unstable and trending low with fasting.  -- Monitor q4h CBGs  -- Holding home 70/30 (unclear dosage) -- Lantus 5u daily  -- Insulin aspart 0-9u correctional  -- PT consult   #Hypothermia:Resolved. Rectal temp of 88.30F on presentation, repeat after bear hugger 1 and 1/2 hours later was T 88.4F. No clear initial cause. Hypoglycemia likely contributing factor. Infectious work up including CXR showing possible RLL pneumonia, later read to be pulmonary edema 2/2 fluid status. UA, blood cultures ordered, negative on  8/1.   -- Admit to step down   -- Monitor temp  -- F/u blood cultures (no growth 8/1)   #Sinus Bradycardia: Resolved.Possible due to profound hypothermia. She is asymptomatic. Husband denies history of bradycardia. PR inveral appears normal, no evidence of heart block.  -- Telemetry -- Pacing if patient becomes symptomatic   Chronic Conditions #CKD V approaching ESRD:Labs appears stable from last check three months ago, however patient and husband are reporting symptoms concerning for worsening symptomatic uremia (generalized malaise, poor appetite, nausea). BUN today is 79. Nephrology consulted and does not feel uremia played a roll in AMS, does not view her as a current candidate for dialysis.  -- Bumex 2mg  BID  -- Goal fluid intake 48oz/ day  -- Strict I/Os, Daily weights   #Type II ZT:IWPYKDX reports recurrent hypoglycemic episodes over the past few weeks in the setting of generalized malaise and poor appetite. Patient was prescribed sliding scale novolin 70/30, 30-65 units qAM and 30-35 units qPM. novolin 70/30 discontinued and regiment changed to dedicated long acting and insulin aspart on 7/31.  - Lantus 5u qhs - Insulin aspart 0-9u with breakfast   - q4h CBGs   #CAD s/p CABG  HLD: Ischemic cardiomyopathy (EF 45-50%) -- Holding home asa 81, isosorbide 10 mg, hydralazine 25 mg TID and lipitor 80 mg   #Breast Cancer:ER+, s/p lumpectomy and adjuvant radiation therapy in 2015. -- Holding anastrozole 1 mg   This is a Careers information officer Note.  The care of the patient was discussed with Dr. Donne Hazel and the assessment and plan formulated with their assistance.  Please see their attached note for official documentation of the daily encounter.   LOS: 2 days   Clearnce Hasten, Medical Student 11/05/2017, 10:26 AM

## 2017-11-05 NOTE — Discharge Instructions (Signed)
Hypoglycemia Hypoglycemia is when the sugar (glucose) level in the blood is too low. Symptoms of low blood sugar may include:  Feeling: ? Hungry. ? Worried or nervous (anxious). ? Sweaty and clammy. ? Confused. ? Dizzy. ? Sleepy. ? Sick to your stomach (nauseous).  Having: ? A fast heartbeat. ? A headache. ? A change in your vision. ? Jerky movements that you cannot control (seizure). ? Nightmares. ? Tingling or no feeling (numbness) around the mouth, lips, or tongue.  Having trouble with: ? Talking. ? Paying attention (concentrating). ? Moving (coordination). ? Sleeping.  Shaking.  Passing out (fainting).  Getting upset easily (irritability).  Low blood sugar can happen to people who have diabetes and people who do not have diabetes. Low blood sugar can happen quickly, and it can be an emergency. Treating Low Blood Sugar Low blood sugar is often treated by eating or drinking something sugary right away. If you can think clearly and swallow safely, follow the 15:15 rule:  Take 15 grams of a fast-acting carb (carbohydrate). Some fast-acting carbs are: ? 1 tube of glucose gel. ? 3 sugar tablets (glucose pills). ? 6-8 pieces of hard candy. ? 4 oz (120 mL) of fruit juice. ? 4 oz (120 mL) of regular (not diet) soda.  Check your blood sugar 15 minutes after you take the carb.  If your blood sugar is still at or below 70 mg/dL (3.9 mmol/L), take 15 grams of a carb again.  If your blood sugar does not go above 70 mg/dL (3.9 mmol/L) after 3 tries, get help right away.  After your blood sugar goes back to normal, eat a meal or a snack within 1 hour.  Treating Very Low Blood Sugar If your blood sugar is at or below 54 mg/dL (3 mmol/L), you have very low blood sugar (severe hypoglycemia). This is an emergency. Do not wait to see if the symptoms will go away. Get medical help right away. Call your local emergency services (911 in the [place] Do not drive yourself to the  hospital. If you have very low blood sugar and you cannot eat or drink, you may need a glucagon shot (injection). A family member or friend should learn how to check your blood sugar and how to give you a glucagon shot. Ask your doctor if you need to have a glucagon shot kit at home. Follow these instructions at home: General instructions  Avoid any diets that cause you to not eat enough food. Talk with your doctor before you start any new diet.  Take over-the-counter and prescription medicines only as told by your doctor.  Limit alcohol to no more than 1 drink per day for nonpregnant women and 2 drinks per day for men. One drink equals 12 oz of beer, 5 oz of wine, or 1 oz of hard liquor.  Keep all follow-up visits as told by your doctor. This is important. If You Have Diabetes:   Make sure you know the symptoms of low blood sugar.  Always keep a source of sugar with you, such as: ? Sugar. ? Sugar tablets. ? Glucose gel. ? Fruit juice. ? Regular soda (not diet soda). ? Milk. ? Hard candy. ? Honey.  Take your medicines as told.  Follow your exercise and meal plan. ? Eat on time. Do not skip meals. ? Follow your sick day plan when you cannot eat or drink normally. Make this plan ahead of time with your doctor.  Check your blood sugar as often  as told by your doctor. Always check before and after exercise.  Share your diabetes care plan with: ? Your work or school. ? People you live with.  Check your pee (urine) for ketones: ? When you are sick. ? As told by your doctor.  Carry a card or wear jewelry that says you have diabetes. If You Have Low Blood Sugar From Other Causes:   Check your blood sugar as often as told by your doctor.  Follow instructions from your doctor about what you cannot eat or drink. Contact a doctor if:  You have trouble keeping your blood sugar in your target range.  You have low blood sugar often. Get help right away if:  You still have  symptoms after you eat or drink something sugary.  Your blood sugar is at or below 54 mg/dL (3 mmol/L).  You have jerky movements that you cannot control.  You pass out. These symptoms may be an emergency. Do not wait to see if the symptoms will go away. Get medical help right away. Call your local emergency services (911 in the [place] Do not drive yourself to the hospital. This information is not intended to replace advice given to you by your health care provider. Make sure you discuss any questions you have with your health care provider. Document Released: 06/18/2009 Document Revised: 08/30/2015 Document Reviewed: 04/27/2015 Elsevier Interactive Patient Education  2018 [place],   It has been a pleasure working with you and we are glad you're feeling better. You were hospitalized for hypoglycemia ( low blood sugar) and volume overload. We think that your fall was related to your blood sugar being very low.   For your hypoglycemia,  START taking Lantus 5 units once daily and novolog 3 units once daily with your largest meal  STOP taking 70/30 insulin  Continue to check your blood sugar four times daily and mention this change to your primary care doctor who manages your diabetes   For your heart failure and renal disease,  START taking Bumex 2 mg twice daily  STOP taking Lasix  Mention this medication change to your nephrologist and primary care doctor Please weigh yourself daily and record the weights   Follow up with your primary care provider in 1-2 weeks  If your symptoms worsen or you develop new symptoms, please seek medical help whether it is your primary care provider or emergency department.  If you have any questions about this hospitalization please call [phone].

## 2017-11-06 ENCOUNTER — Encounter: Payer: Self-pay | Admitting: Podiatry

## 2017-11-08 LAB — CULTURE, BLOOD (ROUTINE X 2)
Culture: NO GROWTH
Culture: NO GROWTH
SPECIAL REQUESTS: ADEQUATE
Special Requests: ADEQUATE

## 2017-11-09 DIAGNOSIS — D631 Anemia in chronic kidney disease: Secondary | ICD-10-CM | POA: Diagnosis not present

## 2017-11-09 DIAGNOSIS — E1122 Type 2 diabetes mellitus with diabetic chronic kidney disease: Secondary | ICD-10-CM | POA: Diagnosis not present

## 2017-11-09 DIAGNOSIS — I5042 Chronic combined systolic (congestive) and diastolic (congestive) heart failure: Secondary | ICD-10-CM | POA: Diagnosis not present

## 2017-11-09 DIAGNOSIS — N185 Chronic kidney disease, stage 5: Secondary | ICD-10-CM | POA: Diagnosis not present

## 2017-11-09 DIAGNOSIS — Z853 Personal history of malignant neoplasm of breast: Secondary | ICD-10-CM | POA: Diagnosis not present

## 2017-11-09 DIAGNOSIS — I120 Hypertensive chronic kidney disease with stage 5 chronic kidney disease or end stage renal disease: Secondary | ICD-10-CM | POA: Diagnosis not present

## 2017-11-09 DIAGNOSIS — N2581 Secondary hyperparathyroidism of renal origin: Secondary | ICD-10-CM | POA: Diagnosis not present

## 2017-11-10 ENCOUNTER — Telehealth: Payer: Self-pay | Admitting: *Deleted

## 2017-11-10 ENCOUNTER — Other Ambulatory Visit: Payer: Self-pay | Admitting: *Deleted

## 2017-11-10 ENCOUNTER — Encounter (HOSPITAL_COMMUNITY): Payer: Self-pay | Admitting: *Deleted

## 2017-11-10 ENCOUNTER — Other Ambulatory Visit: Payer: Self-pay

## 2017-11-10 NOTE — Telephone Encounter (Signed)
Patient called and able to make time change to be at [facility] admitting department at 8 am for 10:30 am procedure. Pre-op instructions unchanged from previous call.

## 2017-11-10 NOTE — Progress Notes (Signed)
Phone call from [facility] at Dr. Bishop Dublin office. Patient to have surgery for access as soon as possible no TDC.  Call to patient and instructed to be at [facility] admitting department at 10:30 am on 11/11/17. NPO past MN tonight and must have a driver and caregiver for discharge. Expect a call and follow the detailed instructions received from the pre-admission department for this surgery. Verbalized understanding. Call to [facility] at PAD.

## 2017-11-10 NOTE — Progress Notes (Signed)
Spoke with pt for pre-op call. Pt has hx of CAD with CABG in 2011. Denies any recent chest pain or sob. Truitt Merle, NP is her cardiologist provider. Pt is a type 2 diabetic. Last A1C was 7.1 on 08/04/17. Pt states her fasting blood sugar is usually in the 130's. Instructed pt to take 70% of her regular dose of Novolin 70/30 insulin this evening, she will take 14 units. Instructed her not to take it in the AM. Also instructed pt to check her blood sugar in the AM when she gets up and every 2 hours until she leaves for the hospital. If blood sugar is 70 or below, treat with 1/2 cup of clear juice (apple or cranberry) and recheck blood sugar 15 minutes after drinking juice. If blood sugar continues to be 70 or below, call the Short Stay department and ask to speak to a nurse. Pt voiced understanding.

## 2017-11-11 ENCOUNTER — Ambulatory Visit (HOSPITAL_COMMUNITY): Payer: Medicare Other | Admitting: Certified Registered"

## 2017-11-11 ENCOUNTER — Ambulatory Visit (HOSPITAL_COMMUNITY)
Admission: RE | Admit: 2017-11-11 | Discharge: 2017-11-11 | Disposition: A | Discharge status: Home / Self Care | Payer: Medicare Other | Source: Ambulatory Visit | Attending: Vascular Surgery | Admitting: Vascular Surgery

## 2017-11-11 ENCOUNTER — Encounter (HOSPITAL_COMMUNITY): Payer: Self-pay

## 2017-11-11 ENCOUNTER — Encounter (HOSPITAL_COMMUNITY)
Admission: RE | Disposition: A | Discharge status: Home / Self Care | Payer: Self-pay | Source: Ambulatory Visit | Attending: Vascular Surgery

## 2017-11-11 ENCOUNTER — Telehealth: Payer: Self-pay | Admitting: Vascular Surgery

## 2017-11-11 DIAGNOSIS — Z808 Family history of malignant neoplasm of other organs or systems: Secondary | ICD-10-CM | POA: Diagnosis not present

## 2017-11-11 DIAGNOSIS — Z951 Presence of aortocoronary bypass graft: Secondary | ICD-10-CM | POA: Diagnosis not present

## 2017-11-11 DIAGNOSIS — Z9841 Cataract extraction status, right eye: Secondary | ICD-10-CM | POA: Insufficient documentation

## 2017-11-11 DIAGNOSIS — I447 Left bundle-branch block, unspecified: Secondary | ICD-10-CM | POA: Insufficient documentation

## 2017-11-11 DIAGNOSIS — M1990 Unspecified osteoarthritis, unspecified site: Secondary | ICD-10-CM | POA: Diagnosis not present

## 2017-11-11 DIAGNOSIS — I5042 Chronic combined systolic (congestive) and diastolic (congestive) heart failure: Secondary | ICD-10-CM | POA: Insufficient documentation

## 2017-11-11 DIAGNOSIS — N184 Chronic kidney disease, stage 4 (severe): Secondary | ICD-10-CM | POA: Insufficient documentation

## 2017-11-11 DIAGNOSIS — I350 Nonrheumatic aortic (valve) stenosis: Secondary | ICD-10-CM | POA: Diagnosis not present

## 2017-11-11 DIAGNOSIS — I130 Hypertensive heart and chronic kidney disease with heart failure and stage 1 through stage 4 chronic kidney disease, or unspecified chronic kidney disease: Secondary | ICD-10-CM | POA: Insufficient documentation

## 2017-11-11 DIAGNOSIS — Z8249 Family history of ischemic heart disease and other diseases of the circulatory system: Secondary | ICD-10-CM | POA: Insufficient documentation

## 2017-11-11 DIAGNOSIS — I132 Hypertensive heart and chronic kidney disease with heart failure and with stage 5 chronic kidney disease, or end stage renal disease: Secondary | ICD-10-CM | POA: Diagnosis not present

## 2017-11-11 DIAGNOSIS — Z8051 Family history of malignant neoplasm of kidney: Secondary | ICD-10-CM | POA: Insufficient documentation

## 2017-11-11 DIAGNOSIS — I255 Ischemic cardiomyopathy: Secondary | ICD-10-CM | POA: Diagnosis not present

## 2017-11-11 DIAGNOSIS — Z87891 Personal history of nicotine dependence: Secondary | ICD-10-CM | POA: Insufficient documentation

## 2017-11-11 DIAGNOSIS — Z853 Personal history of malignant neoplasm of breast: Secondary | ICD-10-CM | POA: Insufficient documentation

## 2017-11-11 DIAGNOSIS — E559 Vitamin D deficiency, unspecified: Secondary | ICD-10-CM | POA: Insufficient documentation

## 2017-11-11 DIAGNOSIS — Z803 Family history of malignant neoplasm of breast: Secondary | ICD-10-CM | POA: Insufficient documentation

## 2017-11-11 DIAGNOSIS — Z923 Personal history of irradiation: Secondary | ICD-10-CM | POA: Insufficient documentation

## 2017-11-11 DIAGNOSIS — M109 Gout, unspecified: Secondary | ICD-10-CM | POA: Diagnosis not present

## 2017-11-11 DIAGNOSIS — Z89421 Acquired absence of other right toe(s): Secondary | ICD-10-CM | POA: Insufficient documentation

## 2017-11-11 DIAGNOSIS — E1122 Type 2 diabetes mellitus with diabetic chronic kidney disease: Secondary | ICD-10-CM | POA: Diagnosis not present

## 2017-11-11 DIAGNOSIS — E1140 Type 2 diabetes mellitus with diabetic neuropathy, unspecified: Secondary | ICD-10-CM | POA: Diagnosis not present

## 2017-11-11 DIAGNOSIS — I509 Heart failure, unspecified: Secondary | ICD-10-CM | POA: Diagnosis not present

## 2017-11-11 DIAGNOSIS — N185 Chronic kidney disease, stage 5: Secondary | ICD-10-CM

## 2017-11-11 DIAGNOSIS — Z9842 Cataract extraction status, left eye: Secondary | ICD-10-CM | POA: Diagnosis not present

## 2017-11-11 DIAGNOSIS — Z89422 Acquired absence of other left toe(s): Secondary | ICD-10-CM | POA: Insufficient documentation

## 2017-11-11 DIAGNOSIS — Z9889 Other specified postprocedural states: Secondary | ICD-10-CM | POA: Diagnosis not present

## 2017-11-11 HISTORY — PX: AV FISTULA PLACEMENT: SHX1204

## 2017-11-11 HISTORY — DX: Dyspnea, unspecified: R06.00

## 2017-11-11 LAB — GLUCOSE, CAPILLARY
GLUCOSE-CAPILLARY: 91 mg/dL (ref 70–99)
Glucose-Capillary: 120 mg/dL — ABNORMAL HIGH (ref 70–99)

## 2017-11-11 LAB — POCT I-STAT 4, (NA,K, GLUC, HGB,HCT)
Glucose, Bld: 100 mg/dL — ABNORMAL HIGH (ref 70–99)
HCT: 30 % — ABNORMAL LOW (ref 36.0–46.0)
Hemoglobin: 10.2 g/dL — ABNORMAL LOW (ref 12.0–15.0)
Potassium: 3.9 mmol/L (ref 3.5–5.1)
SODIUM: 143 mmol/L (ref 135–145)

## 2017-11-11 SURGERY — ARTERIOVENOUS (AV) FISTULA CREATION
Anesthesia: Monitor Anesthesia Care | Laterality: Right

## 2017-11-11 MED ORDER — ONDANSETRON HCL 4 MG/2ML IJ SOLN
INTRAMUSCULAR | Status: DC | PRN
  Administered 2017-11-11: 4 mg via INTRAVENOUS

## 2017-11-11 MED ORDER — FENTANYL CITRATE (PF) 250 MCG/5ML IJ SOLN
INTRAMUSCULAR | Status: AC
  Filled 2017-11-11: qty 5

## 2017-11-11 MED ORDER — CEFAZOLIN SODIUM-DEXTROSE 2-4 GM/100ML-% IV SOLN
2.0000 g | INTRAVENOUS | Status: AC
  Administered 2017-11-11: 2 g via INTRAVENOUS

## 2017-11-11 MED ORDER — PROPOFOL 500 MG/50ML IV EMUL
INTRAVENOUS | Status: DC | PRN
  Administered 2017-11-11: 50 ug/kg/min via INTRAVENOUS

## 2017-11-11 MED ORDER — LIDOCAINE-EPINEPHRINE 0.5 %-1:200000 IJ SOLN
INTRAMUSCULAR | Status: AC
  Filled 2017-11-11: qty 1

## 2017-11-11 MED ORDER — GLYCOPYRROLATE PF 0.2 MG/ML IJ SOSY
PREFILLED_SYRINGE | INTRAMUSCULAR | Status: DC | PRN
  Administered 2017-11-11 (×2): .1 mg via INTRAVENOUS

## 2017-11-11 MED ORDER — PHENYLEPHRINE HCL 10 MG/ML IJ SOLN
INTRAMUSCULAR | Status: AC
  Filled 2017-11-11: qty 1

## 2017-11-11 MED ORDER — CHLORHEXIDINE GLUCONATE 4 % EX LIQD: 60.0000 mL | Freq: Once | CUTANEOUS | Status: DC

## 2017-11-11 MED ORDER — LIDOCAINE 2% (20 MG/ML) 5 ML SYRINGE
INTRAMUSCULAR | Status: DC | PRN
  Administered 2017-11-11: 60 mg via INTRAVENOUS

## 2017-11-11 MED ORDER — SODIUM CHLORIDE 0.9 % IV SOLN
INTRAVENOUS | Status: DC | PRN
  Administered 2017-11-11: 500 mL

## 2017-11-11 MED ORDER — KETAMINE HCL 50 MG/5ML IJ SOSY
PREFILLED_SYRINGE | INTRAMUSCULAR | Status: AC
  Filled 2017-11-11: qty 5

## 2017-11-11 MED ORDER — SODIUM CHLORIDE 0.9 % IV SOLN
INTRAVENOUS | Status: DC
  Administered 2017-11-11: 11:00:00 via INTRAVENOUS

## 2017-11-11 MED ORDER — LIDOCAINE-EPINEPHRINE 0.5 %-1:200000 IJ SOLN
INTRAMUSCULAR | Status: DC | PRN
  Administered 2017-11-11: 6 mL

## 2017-11-11 MED ORDER — OXYCODONE HCL 5 MG PO TABS: 5.0000 mg | ORAL_TABLET | Freq: Four times a day (QID) | ORAL | 0 refills | Status: DC | PRN

## 2017-11-11 MED ORDER — 0.9 % SODIUM CHLORIDE (POUR BTL) OPTIME
TOPICAL | Status: DC | PRN
  Administered 2017-11-11: 1000 mL

## 2017-11-11 MED ORDER — SODIUM CHLORIDE 0.9 % IV SOLN
INTRAVENOUS | Status: DC | PRN
  Administered 2017-11-11: 20 ug/min via INTRAVENOUS

## 2017-11-11 MED ORDER — CEFAZOLIN SODIUM-DEXTROSE 2-4 GM/100ML-% IV SOLN
INTRAVENOUS | Status: AC
  Filled 2017-11-11: qty 100

## 2017-11-11 MED ORDER — PROPOFOL 1000 MG/100ML IV EMUL
INTRAVENOUS | Status: AC
  Filled 2017-11-11: qty 100

## 2017-11-11 MED ORDER — SODIUM CHLORIDE 0.9 % IV SOLN
INTRAVENOUS | Status: AC
  Filled 2017-11-11: qty 1.2

## 2017-11-11 MED ORDER — MIDAZOLAM HCL 2 MG/2ML IJ SOLN
INTRAMUSCULAR | Status: AC
  Filled 2017-11-11: qty 2

## 2017-11-11 MED ORDER — FENTANYL CITRATE (PF) 100 MCG/2ML IJ SOLN: 25.0000 ug | INTRAMUSCULAR | Status: DC | PRN

## 2017-11-11 MED ORDER — MIDAZOLAM HCL 5 MG/5ML IJ SOLN
INTRAMUSCULAR | Status: DC | PRN
  Administered 2017-11-11 (×2): 1 mg via INTRAVENOUS

## 2017-11-11 MED ORDER — GLYCOPYRROLATE PF 0.2 MG/ML IJ SOSY
PREFILLED_SYRINGE | INTRAMUSCULAR | Status: AC
  Filled 2017-11-11: qty 1

## 2017-11-11 SURGICAL SUPPLY — 30 items
ADH SKN CLS APL DERMABOND .7 (GAUZE/BANDAGES/DRESSINGS) ×1
ARMBAND PINK RESTRICT EXTREMIT (MISCELLANEOUS) ×6 IMPLANT
CANISTER SUCT 3000ML PPV (MISCELLANEOUS) ×3 IMPLANT
CANNULA VESSEL 3MM 2 BLNT TIP (CANNULA) ×3 IMPLANT
CLIP LIGATING EXTRA MED SLVR (CLIP) ×3 IMPLANT
CLIP LIGATING EXTRA SM BLUE (MISCELLANEOUS) ×3 IMPLANT
COVER PROBE W GEL 5X96 (DRAPES) ×3 IMPLANT
DECANTER SPIKE VIAL GLASS SM (MISCELLANEOUS) ×3 IMPLANT
DERMABOND ADVANCED (GAUZE/BANDAGES/DRESSINGS) ×2
DERMABOND ADVANCED .7 DNX12 (GAUZE/BANDAGES/DRESSINGS) ×1 IMPLANT
ELECT REM PT RETURN 9FT ADLT (ELECTROSURGICAL) ×3
ELECTRODE REM PT RTRN 9FT ADLT (ELECTROSURGICAL) ×1 IMPLANT
GLOVE BIOGEL PI IND STRL 6.5 (GLOVE) IMPLANT
GLOVE BIOGEL PI INDICATOR 6.5 (GLOVE) ×6
GLOVE SS BIOGEL STRL SZ 7.5 (GLOVE) ×1 IMPLANT
GLOVE SUPERSENSE BIOGEL SZ 7.5 (GLOVE) ×2
GLOVE SURG SS PI 6.5 STRL IVOR (GLOVE) ×4 IMPLANT
GOWN STRL REUS W/ TWL LRG LVL3 (GOWN DISPOSABLE) ×3 IMPLANT
GOWN STRL REUS W/TWL LRG LVL3 (GOWN DISPOSABLE) ×9
KIT BASIN OR (CUSTOM PROCEDURE TRAY) ×3 IMPLANT
KIT TURNOVER KIT B (KITS) ×3 IMPLANT
NS IRRIG 1000ML POUR BTL (IV SOLUTION) ×3 IMPLANT
PACK CV ACCESS (CUSTOM PROCEDURE TRAY) ×3 IMPLANT
PAD ARMBOARD 7.5X6 YLW CONV (MISCELLANEOUS) ×6 IMPLANT
SUT PROLENE 6 0 CC (SUTURE) ×3 IMPLANT
SUT VIC AB 3-0 SH 27 (SUTURE) ×3
SUT VIC AB 3-0 SH 27X BRD (SUTURE) ×1 IMPLANT
TOWEL GREEN STERILE (TOWEL DISPOSABLE) ×3 IMPLANT
UNDERPAD 30X30 (UNDERPADS AND DIAPERS) ×3 IMPLANT
WATER STERILE IRR 1000ML POUR (IV SOLUTION) ×3 IMPLANT

## 2017-11-11 NOTE — Transfer of Care (Signed)
Immediate Anesthesia Transfer of Care Note  Patient: [name]  Procedure(s) Performed: RIGHT RADIOCEPHALIC  ARTERIOVENOUS FISTULA (Right )  Patient Location: PACU  Anesthesia Type:MAC  Level of Consciousness: awake, alert  and oriented  Airway & Oxygen Therapy: Patient Spontanous Breathing and Patient connected to face mask oxygen  Post-op Assessment: Report given to RN and Post -op Vital signs reviewed and stable  Post vital signs: Reviewed and stable  Last Vitals:  Vitals Value Taken Time  BP 111/44 11/11/2017 12:51 PM  Temp    Pulse 65 11/11/2017 12:53 PM  Resp 20 11/11/2017 12:53 PM  SpO2 95 % 11/11/2017 12:53 PM  Vitals shown include unvalidated device data.  Last Pain:  Vitals:   11/11/17 0941  TempSrc:   PainSc: 3       Patients Stated Pain Goal: 0 (10/62/69 4854)  Complications: No apparent anesthesia complications

## 2017-11-11 NOTE — Discharge Instructions (Signed)
° °  [facility]  Discharge Instructions  AV Fistula or Graft Surgery for Dialysis Access  Please refer to the following instructions for your post-procedure care. Your surgeon or physician assistant will discuss any changes with you.  Activity  You may drive the day following your surgery, if you are comfortable and no longer taking prescription pain medication. Resume full activity as the soreness in your incision resolves.  Bathing/Showering  You may shower after you go home. Keep your incision dry for 48 hours. Do not soak in a bathtub, hot tub, or swim until the incision heals completely. You may not shower if you have a hemodialysis catheter.  Incision Care  Clean your incision with mild soap and water after 48 hours. Pat the area dry with a clean towel. You do not need a bandage unless otherwise instructed. Do not apply any ointments or creams to your incision. You may have skin glue on your incision. Do not peel it off. It will come off on its own in about one week. Your arm may swell a bit after surgery. To reduce swelling use pillows to elevate your arm so it is above your heart. Your doctor will tell you if you need to lightly wrap your arm with an ACE bandage.  Diet  Resume your normal diet. There are not special food restrictions following this procedure. In order to heal from your surgery, it is CRITICAL to get adequate nutrition. Your body requires vitamins, minerals, and protein. Vegetables are the best source of vitamins and minerals. Vegetables also provide the perfect balance of protein. Processed food has little nutritional value, so try to avoid this.  Medications  Resume taking all of your medications. If your incision is causing pain, you may take over-the counter pain relievers such as acetaminophen (Tylenol). If you were prescribed a stronger pain medication, please be aware these medications can cause nausea and constipation. Prevent  nausea by taking the medication with a snack or meal. Avoid constipation by drinking plenty of fluids and eating foods with high amount of fiber, such as fruits, vegetables, and grains.  Do not take Tylenol if you are taking prescription pain medications.  Follow up Your surgeon may want to see you in the office following your access surgery. If so, this will be arranged at the time of your surgery.  Please call us immediately for any of the following conditions:  Increased pain, redness, drainage (pus) from your incision site Fever of 101 degrees or higher Severe or worsening pain at your incision site Hand pain or numbness.  Reduce your risk of vascular disease:  Stop smoking. If you would like help, call [facility] at 1-800-QUIT-NOW 951 042 1782) or [place] at [place] your cholesterol Maintain a desired weight Control your diabetes Keep your blood pressure down  Dialysis  It will take several weeks to several months for your new dialysis access to be ready for use. Your surgeon will determine when it is okay to use it. Your nephrologist will continue to direct your dialysis. You can continue to use your Permcath until your new access is ready for use.   11/11/2017 [name] [id number] 06/21/1945  Surgeon(s): Early, Arvilla Meres, MD  Procedure(s): RIGHT RADIOCEPHALIC  ARTERIOVENOUS FISTULA  x Do not stick fistula for 12 weeks    If you have any questions, please call the office at [phone].

## 2017-11-11 NOTE — Telephone Encounter (Signed)
sch appt spk to pt 12/24/17 4pm p/o PA

## 2017-11-11 NOTE — Anesthesia Procedure Notes (Signed)
Procedure Name: MAC Date/Time: 11/11/2017 12:00 PM Performed by: Teressa Lower., CRNA Pre-anesthesia Checklist: Patient identified, Emergency Drugs available, Suction available, Patient being monitored and Timeout performed Patient Re-evaluated:Patient Re-evaluated prior to induction Oxygen Delivery Method: Simple face mask Ventilation: Oral airway inserted - appropriate to patient size

## 2017-11-11 NOTE — H&P (Signed)
Conrad [place], MD      Conrad [place], MD  Physician  Vascular Surgery      Progress Notes   This note has been shared with the patient  Addendum     Encounter Date:  08/26/2017                           Expand widget buttonCollapse widget button    Show:Clear all   ManualTemplateCopied  Added by:     Conrad [place], MD   Hover for detailscustomization button                                                                                                                     untitled image        Requested by:   Unk Pinto, MD  [address]  [place]  [place], [place] [address]     Reason for consultation: New access         History of Present Illness       [name] is a 72 y.o. (03-03-1946) female who presents for evaluation for permanent access.  The patient is right hand dominant with left left lumpectomy with SLN.  The patient has not had previous access procedures.  Previous central venous cannulation procedures include: none.  The patient has never had a PPM placed.           Past Medical History:    Diagnosis   Date    .   Allergy        .   Arthritis            "maybe in my lower back" (08/27/2012)    .   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   Chronic combined systolic and diastolic CHF (congestive heart failure) ([facility])        .   CKD (chronic kidney disease), stage IV ([facility])        .   CKD stage 3 due to type 2 diabetes mellitus ([facility])   05/08/2015    .   Coronary artery disease            a. NSTEMI in 05/2009 s/p CABG (LIMA-LAD, SVG-diagonal, SVG-OM1/OM 2).     .   Family history of anesthesia complication             Mom has a hard time to wake up    .   Foot ulcer ([facility])            "I've had them on both feet" (08/27/2012)    .   Gouty arthritis            "right index finger" (08/27/2012)    .  History of radiation therapy   06/09/13-07/06/13        left breast 50Gy    .   Hyperlipidemia        .   Hypertension        .   Infection            right second toe    .   Ischemic cardiomyopathy            a. 2011: EF 40-45%. b. EF 55-60% in 04/2017 but shortly after as inpatient was 45-50%.    Marland Kitchen   LBBB (left bundle branch block)        .   Mitral regurgitation        .   Neuropathy            Hx; of B/L feet    .   NSTEMI (non-ST elevated myocardial infarction) ([facility])   05/23/2009    .   Osteomyelitis of foot ([facility])        .   PAD (peripheral artery disease) ([facility])            a. LE PAD (patient previously elected hold off L fem-pop), prev followed by Dr. Bridgett Larsson    .   PONV (postoperative nausea and vomiting)        .   Sinus headache            "occasionally" (08/27/2012)    .   Type II diabetes mellitus ([facility])        .   Vitamin D deficiency                    Past Surgical History:    Procedure   Laterality   Date    .   AMPUTATION   Right   08/27/2012        Procedure: AMPUTATION RAY;  Surgeon: Newt Minion, MD;  Location: [facility];  Service: Orthopedics;  Laterality: Right;  Right Foot 5th Ray Amputation    .   AMPUTATION   Left   07/08/2013        Procedure: AMPUTATION RAY;  Surgeon: Newt Minion, MD;  Location: [facility];  Service: Orthopedics;  Laterality: Left;  Left Foot 2nd Ray Amputation    .   AMPUTATION RAY   Right   08/27/2012        5th ray/notes 08/27/2012    .   AMPUTATION TOE   Right   03/06/2017        Procedure: AMPUTATION TOE, INTERPHANGEAL 2ND RIGHT;  Surgeon: [name], DPM;  Location: [facility];   Service: Podiatry;  Laterality: Right;    .   BREAST LUMPECTOMY       04/20/2013        with biopsy      DR WAKEFIELD    .   BREAST LUMPECTOMY WITH NEEDLE LOCALIZATION AND AXILLARY SENTINEL LYMPH NODE BX   Left   04/20/2013        Procedure: LEFT BREAST WIRE GUIDED LUMPECTOMY AND AXILLARY SENTINEL LYMPH NODE BX;  Surgeon: Rolm Bookbinder, MD;  Location: [facility];  Service: General;  Laterality: Left;    .   CARDIAC CATHETERIZATION       05/24/2009        Archie Endo 05/24/2009 (08/27/2012)    .   CATARACT EXTRACTION W/ INTRAOCULAR LENS  IMPLANT, BILATERAL       2000's    .  COLONOSCOPY W/ BIOPSIES AND POLYPECTOMY       2010    .   CORONARY ARTERY BYPASS GRAFT       2011        "CABG X4" (08/27/2012)    .   DENTAL SURGERY       04/30/11        "1 implant" (08/27/2012)    .   DILATION AND CURETTAGE OF UTERUS            .   EYE SURGERY            .   FINGER SURGERY   Right            "index finger; turned out to be gout" (08/27/2012)           Social History             Socioeconomic History    .   Marital status:   Married            Spouse name:   [name]    .   Number of children:   1    .   Years of education:   Not on file    .   Highest education level:   Not on file    Occupational History    .   Not on file    Social Needs    .   Financial resource strain:   Not on file    .   Food insecurity:            Worry:   Not on file            Inability:   Not on file    .   Transportation needs:            Medical:   Not on file            Non-medical:   Not on file    Tobacco Use    .   Smoking status:   Never Smoker    .   Smokeless tobacco:   Never Used    Substance and Sexual Activity    .   Alcohol use:   Yes            Comment: rarely drinks wine    .   Drug  use:   No    .   Sexual activity:   Not Currently    Lifestyle    .   Physical activity:            Days per week:   Not on file            Minutes per session:   Not on file    .   Stress:   Not on file    Relationships    .   Social connections:            Talks on phone:   Not on file            Gets together:   Not on file            Attends religious service:   Not on file            Active member of club or organization:   Not on file            Attends meetings of clubs or organizations:   Not on file  Relationship status:   Not on file    .   Intimate partner violence:            Fear of current or ex partner:   Not on file            Emotionally abused:   Not on file            Physically abused:   Not on file            Forced sexual activity:   Not on file    Other Topics   Concern    .   Not on file    Social History Narrative    .   Not on file                Family History    Problem   Relation   Age of Onset    .   Kidney cancer   Brother   90    .   Hypertension   Brother        .   Breast cancer   Sister   74            LCIS; BRCA negative    .   Throat cancer   Father   74            smoker    .   Breast cancer   Sister   57            Lobular breast cancer    .   Breast cancer   Maternal Aunt                dx in her 41s                 Current Outpatient Medications    Medication   Sig   Dispense   Refill    .   allopurinol (ZYLOPRIM) 100 MG tablet   TAKE 1 TABLET TWICE DAILY (Patient taking differently: Take 100 mg by mouth two times a day)   180 tablet   1    .   anastrozole (ARIMIDEX) 1 MG tablet   Take 1 tablet (1 mg total) by mouth daily.   90 tablet   3    .   aspirin EC 81 MG tablet    Take 1 tablet (81 mg total) by mouth daily.            Marland Kitchen   aspirin-acetaminophen-caffeine (EXCEDRIN MIGRAINE) 250-250-65 MG tablet   Take 1 tablet by mouth daily as needed (for sinus headaches.).            Marland Kitchen   atorvastatin (LIPITOR) 80 MG tablet   TAKE 1 TABLET DAILY AT 6 PM.   90 tablet   0    .   beta carotene w/minerals (OCUVITE) tablet   Take 1 tablet by mouth daily.            .   carvedilol (COREG) 25 MG tablet   TAKE 1 TABLET TWICE DAILY WITH MEALS   180 tablet   0    .   Cholecalciferol (VITAMIN D3) 2000 units TABS   Take 6,000 Units by mouth daily.             Marland Kitchen   docusate sodium (COLACE) 100 MG capsule   Take 100 mg by mouth at bedtime.            Marland Kitchen  fluticasone (FLONASE) 50 MCG/ACT nasal spray   Place 2 sprays into both nostrils daily as needed for allergies or rhinitis.            .   hydrALAZINE (APRESOLINE) 25 MG tablet   TAKE 3 TABLETS THREE TIMES DAILY   270 tablet   1    .   insulin NPH-regular Human (NOVOLIN 70/30) (70-30) 100 UNIT/ML injection   Inject 30-65 Units into the skin See admin instructions. 60-65 units into the skin in the morning before breakfast & 30-35 units in the evening, depending on BGL            .   isosorbide dinitrate (ISORDIL) 10 MG tablet   TAKE 1 TABLET THREE TIMES DAILY   270 tablet   0    .   NON FORMULARY   [employer] Tonic and Homeopathic Remedy: Mix 1 ounce with water and drink one to two times a day (5-6 times a week)            .   Polyethyl Glycol-Propyl Glycol (LUBRICANT EYE DROPS) 0.4-0.3 % SOLN   Place 1-2 drops into both eyes 3 (three) times daily as needed (for dry eyes.).            Marland Kitchen   furosemide (LASIX) 40 MG tablet   Take 1 tablet (40 mg total) by mouth daily. (Patient taking differently: Take 120 mg by mouth daily. )   90 tablet   3        No current facility-administered medications for this  visit.                 Allergies    Allergen   Reactions    .   Atenolol   Other (See Comments)            Exacerbates gout    .   Contrast Media [Iodinated Diagnostic Agents]   Rash    .   Latex   Rash    .   Omnipaque [Iohexol]   Rash          REVIEW OF SYSTEMS (negative unless checked):      Cardiac:    Chest pain or chest pressure?   Shortness of breath upon activity?   Shortness of breath when lying flat?   Irregular heart rhythm?     Vascular:    Pain in calf, thigh, or hip brought on by walking?   Pain in feet at night that wakes you up from your sleep?   Blood clot in your veins?   Leg swelling?     Pulmonary:    Oxygen at home?   Productive cough?   Wheezing?     Neurologic:    Sudden weakness in arms or legs?   Sudden numbness in arms or legs?   Sudden onset of difficult speaking or slurred speech?   Temporary loss of vision in one eye?   Problems with dizziness?     Gastrointestinal:    Blood in stool?   Vomited blood?     Genitourinary:    Burning when urinating?   Blood in urine?     Psychiatric:    Major depression     Hematologic:    Bleeding problems?   Problems with blood clotting?     Dermatologic:    Rashes or ulcers?     Constitutional:    Fever or chills?     Ear/Nose/Throat:    Change in  hearing?   Nose bleeds?   Sore throat?     Musculoskeletal:    Back pain?   Joint pain?   Muscle pain?          Physical Examination              Vitals:        08/26/17 0936    BP:   (!) 142/60    Pulse:   71    Resp:   18    SpO2:   99%    Weight:   174 lb 8 oz (79.2 kg)    Height:   '5\' 5"'$  (1.651 m)       Body mass index is 29.04 kg/m.      General   Alert, O x 3, WD, NAD    Head   /AT,      Ear/Nose/  Throat   Hearing grossly intact, nares without erythema or  drainage, oropharynx without Erythema or Exudate, Mallampati score: 3,     Eyes   PERRLA, EOMI,      Neck   Supple, mid-line trachea,      Pulmonary   Sym exp, good B air movt, CTA B    Cardiac   RRR, Nl S1, S2, no Murmurs, No rubs, No S3,S4    Vascular    Vessel   Right   Left    Radial   Palpable   Palpable    Brachial   Palpable   Palpable    Carotid   Palpable, No Bruit   Palpable, No Bruit    Aorta   Not palpable   N/A    Femoral   Palpable   Palpable    Popliteal   Not palpable   Not palpable    PT   Faintly palpable   Faintly palpable    DP   Faintly palpable   Faintly palpable       Gastro-  intestinal   soft, non-distended, non-tender to palpation, No guarding or rebound, no HSM, no masses, no CVAT B, No palpable prominent aortic pulse,      Musculo-  skeletal   M/S 5/5 throughout  , Both feet with socks on per patient's preference, visible cephalic vein in both arms with palpable radial pulses    Neurologic   Cranial nerves 2-12 intact , Pain and light touch intact in extremities , Motor exam as listed above    Psychiatric   Judgement intact, Mood & affect appropriate for pt's clinical situation    Dermatologic   See M/S exam for extremity exam, No rashes otherwise noted    Lymphatic    Palpable lymph nodes: None              Non-invasive Vascular Imaging       BUE Vein Mapping  (Date: 08/26/2017):   .R: adeq cephalic vein throughout, basilic vein adequate   .L: adeq cephalic vein throughout      BUE Doppler (Date: 08/26/2017):   .R: brachial adeq, radial marginal   .L: brachial adeq       Outside Studies/Documentation       10 pages of outside documents were reviewed including: outpatient renal chart.         Medical Decision Making       [name] is a 72 y.o. female who presents with chronic kidney disease stage IV-V      .Pt did not have a full ax dissection, so I  suspect use of the left arm would be ok, but patient prefers to avoid such.   .L arm venogram would verify patency of venous structures.    .Based on exam and vein mapping this patient's permanent access options include: R RC vs BC AVF, maybe L RC vs BC AVF, R staged BVT   .I would first proceed with right RC vs BC AVF .   Marland KitchenI had an extensive discussion with this patient in regards to the nature of access surgery, including risk, benefits, and alternatives.     .The patient is aware that the risks of access surgery include but are not limited to: bleeding, infection, steal syndrome, nerve damage, ischemic monomelic neuropathy, failure of access to mature, complications related to venous hypertension, and possible need for additional access procedures in the future.   .The patient has NOT agreed to proceed with the above procedure.   .She is going take the ESRD-HD classes and call us once she is ready to proceed.         Adele Barthel, MD, FACS  [facility]  Office: [phone]  Pager: [phone]      Addendum:  The patient has been re-examined and re-evaluated.  The patient's history and physical has been reviewed and is unchanged.    [name] is a 72 y.o. female is being admitted with CHRONIC KIDNEY DISEASE FOR HEMODILAYSIS ACCESS. All the risks, benefits and other treatment options have been discussed with the patient. The patient has consented to proceed with Procedure(s): RIGHT RADIOCEPHALIC VERSUS BRACHIOCEPHALIC ARTERIOVENOUS FISTULA as a surgical intervention.  Emely Fahy 11/11/2017 11:11 AM Vascular and Vein Surgery

## 2017-11-11 NOTE — Anesthesia Postprocedure Evaluation (Signed)
Anesthesia Post Note  Patient: [name]  Procedure(s) Performed: RIGHT RADIOCEPHALIC  ARTERIOVENOUS FISTULA (Right )     Patient location during evaluation: PACU Anesthesia Type: MAC Level of consciousness: awake Vital Signs Assessment: post-procedure vital signs reviewed and stable Respiratory status: spontaneous breathing Cardiovascular status: stable Anesthetic complications: no    Last Vitals:  Vitals:   11/11/17 1352 11/11/17 1407  BP: (!) 117/51 (!) 117/43  Pulse: 68 67  Resp: 19 15  Temp:  (!) 36.4 C  SpO2: 99% 93%    Last Pain:  Vitals:   11/11/17 1407  TempSrc:   PainSc: 0-No pain                 Jazzman Loughmiller

## 2017-11-11 NOTE — Op Note (Signed)
    OPERATIVE REPORT  DATE OF SURGERY: 11/11/2017  PATIENT: [name], 72 y.o. female MRN: [id number]  DOB: September 12, 1945  PRE-OPERATIVE DIAGNOSIS: Chronic renal insufficiency  POST-OPERATIVE DIAGNOSIS:  Same  PROCEDURE: Right radiocephalic AV fistula  SURGEON:  Curt Jews, M.D.  PHYSICIAN ASSISTANT: Nurse  ANESTHESIA: Local with sedation  EBL: per anesthesia record  No intake/output data recorded.  BLOOD ADMINISTERED: none  DRAINS: none  SPECIMEN: none  COUNTS CORRECT:  YES  PATIENT DISPOSITION:  PACU - hemodynamically stable  PROCEDURE DETAILS: Patient was taken to the operating placed where the area of the right arm was prepped draped you sterile fashion.  SonoSite ultrasound was used to visualize the veins and the patient did have a good caliber cephalic vein from the wrist past the antecubital space.  Using local anesthesia incision was made between the level of the cephalic vein and the radial artery at the wrist.  Cephalic vein branches were ligated with 3-0 silk ties and divided.  The vein was ligated distally and divided and was gently dilated with heparinized saline and was of excellent caliber.  The vein was brought into approximation with the radial artery.  The radial artery was exposed to the same incision.  It was very calcified but did have a pulse present.  The artery was occluded proximally and distally with Vesseloops and was opened with an 11 blade and sent lost 20 with Potts scissors.  The vein was cut to the appropriate length and was spatulated and sewn end-to-side to the artery with a running 6-0 Prolene suture.  Clamps removed and excellent thrill was noted through the vein.  The wounds were irrigated with saline.  Hemostasis talus cautery.  The wound was closed with 3-0 Vicryl in the subcutaneous and subcuticular tissue.  Sterile dressing was applied and the patient was transferred to the recovery room in stable condition   [name], M.D.,  [facility] 11/11/2017 12:49 PM

## 2017-11-11 NOTE — Anesthesia Preprocedure Evaluation (Signed)
Anesthesia Evaluation  Patient identified by MRN, date of birth, ID band Patient awake    History of Anesthesia Complications (+) PONV  Airway Mallampati: II  TM Distance: >3 FB     Dental   Pulmonary shortness of breath, asthma , pneumonia, COPD, former smoker,    breath sounds clear to auscultation       Cardiovascular hypertension, + CAD, + Past MI, + Peripheral Vascular Disease and +CHF  + dysrhythmias  Rhythm:Regular Rate:Normal     Neuro/Psych    GI/Hepatic negative GI ROS, Neg liver ROS,   Endo/Other  diabetes  Renal/GU Renal disease     Musculoskeletal   Abdominal   Peds  Hematology   Anesthesia Other Findings   Reproductive/Obstetrics                             Anesthesia Physical Anesthesia Plan  ASA: III  Anesthesia Plan: MAC   Post-op Pain Management:    Induction: Intravenous  PONV Risk Score and Plan: Treatment may vary due to age or medical condition  Airway Management Planned: Simple Face Mask and Nasal Cannula  Additional Equipment:   Intra-op Plan:   Post-operative Plan:   Informed Consent: I have reviewed the patients History and Physical, chart, labs and discussed the procedure including the risks, benefits and alternatives for the proposed anesthesia with the patient or authorized representative who has indicated his/her understanding and acceptance.   Dental advisory given  Plan Discussed with: CRNA and Anesthesiologist  Anesthesia Plan Comments:         Anesthesia Quick Evaluation

## 2017-11-12 ENCOUNTER — Ambulatory Visit: Payer: Medicare Other | Admitting: Podiatry

## 2017-11-12 ENCOUNTER — Encounter (HOSPITAL_COMMUNITY): Payer: Self-pay | Admitting: Vascular Surgery

## 2017-11-16 ENCOUNTER — Telehealth: Payer: Self-pay | Admitting: *Deleted

## 2017-11-16 NOTE — Telephone Encounter (Signed)
Called patient on 11/16/2017 , 11:38 AM in an attempt to reach the patient for a hospital follow up.  Admit date: 11/11/17 Discharge date: 11/11/17.   I have tried several times to get pt with no response.

## 2017-11-18 ENCOUNTER — Other Ambulatory Visit: Payer: Self-pay | Admitting: Internal Medicine

## 2017-11-25 ENCOUNTER — Encounter: Payer: Self-pay | Admitting: Adult Health

## 2017-11-25 NOTE — Progress Notes (Deleted)
Patient ID: [name], female   DOB: 03-21-1946, 72 y.o.   MRN: [id number]  MEDICARE ANNUAL WELLNESS VISIT AND FOLLOW UP  Assessment:    Encounter for Medicare annual wellness exam  Peripheral vascular disease ([place]) Control blood pressure, cholesterol, glucose, increase exercise.   Essential hypertension Continue medication Monitor blood pressure at home; call if consistently over 130/80 Continue DASH diet.   Reminder to go to the ER if any CP, SOB, nausea, dizziness, severe HA, changes vision/speech, left arm numbness and tingling and jaw pain.  Coronary artery disease involving coronary bypass graft of native heart without angina pectoris Control blood pressure, cholesterol, glucose, increase exercise.  Followed by Dr. Johnsie Cancel  Chronic combined systolic and diastolic congestive heart failure (HCC) Weights stable, monitor closely, call for weight gain or increasing dyspnea Followed by Dr. Johnsie Cancel  EMPHYSEMA/ Asthma with status asthmaticus, unspecified asthma severity, unspecified whether persistent Monitor CXR; continue inhalers  Type II diabetes mellitus with neurological manifestations, uncontrolled (HCC) Check feet daily,  Diabetes mellitus type 2, insulin dependent [facility]) Education: Reviewed 'ABCs' of diabetes management (respective goals in parentheses):  A1C (<7), blood pressure (<130/80), and cholesterol (LDL <70) Eye Exam yearly and Dental Exam every 6 months. Dietary recommendations Physical Activity recommendations - A1C  Gouty arthritis Continue allopurinol Diet discussed Check uric acid as needed  Chronic kidney disease (CKD), active medical management without dialysis, stage 5 (HCC) Increase fluids, avoid NSAIDS, monitor sugars, will monitor Recently had fistula placed, doing well Followed by Dr. Hollie Salk  Vitamin D deficiency Continue supplementation Check vitamin D level PRN  Medication management CBC, CMP/GFR, magnesium  Malignant neoplasm of  upper-inner quadrant of left breast in female, estrogen receptor positive ([place]) Followed by Dr. Jana Hakim, continue anastrozole  Hyperlipidemia Continue medications Continue low cholesterol diet and exercise.  Check lipid panel.   Bradycardia ***  BMI 29.0-29.9,adult Long discussion about weight loss, diet, and exercise Recommended diet heavy in fruits and veggies and low in animal meats, cheeses, and dairy products, appropriate calorie intake Discussed appropriate weight for height  Follow up at next visit  Anemia, unspecified type CBC    Over 30 minutes of exam, counseling, chart review, and critical decision making was performed  Future Appointments  Date Time Provider [place]  11/26/2017 11:15 AM Liane Comber, NP [facility] None  12/24/2017  3:00 PM [facility] HS VASC 6 - PS [facility] VVS  12/24/2017  4:00 PM [facility] PA [facility]VS  01/18/2018 11:30 AM Burtis Junes, NP [facility]  02/10/2018 11:00 AM [facility] LAB 1 [facility] None  02/10/2018 11:30 AM Magrinat, Virgie Dad, MD [facility] None  03/02/2018 11:00 AM Unk Pinto, MD [facility] None  09/07/2018  3:00 PM Unk Pinto, MD [facility] None     Plan:   During the course of the visit the patient was educated and counseled about appropriate screening and preventive services including:    Pneumococcal vaccine   Influenza vaccine  Td vaccine  Prevnar 13  Screening electrocardiogram  Screening mammography  Bone densitometry screening  Colorectal cancer screening  Diabetes screening  Glaucoma screening  Nutrition counseling   Advanced directives: given info/requested copies    Subjective:   [name] is a 72 y.o. female who presents for Medicare Annual Wellness Visit and 3 month follow up on hypertension, diabetes, hyperlipidemia, vitamin D def. She has had breast cancer in 2014, follows with [name], she is on anastrozole. In 2014, she had CABG and is  followed by Dr Jenkins Rouge. Patient  was hospitalized Jan 27-31, 2019 with acute on chronic combined systolic/diastolic CHF (ECHO EF 36-14%) and since is followed closely at the Heart Failure clinic. She has poorly controlled T2DM with CKD5  progressing over the last year & followed closely by Dr Madelon Lips, and recently this month she was admitted by vascular for R AV fistula placement ***. 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  BMI is There is no height or weight on file to calculate BMI., she {HAS HAS ERX:54008} been working on diet and exercise. Wt Readings from Last 3 Encounters:  11/11/17 179 lb 12.8 oz (81.6 kg)  11/05/17 179 lb 12.8 oz (81.6 kg)  10/07/17 178 lb (80.7 kg)   Her blood pressure has been controlled at home, today their BP is   She does not workout. She denies chest pain, shortness of breath, dizziness.   She is on cholesterol medication and denies myalgias. Her cholesterol is at goal. The cholesterol last visit was:   Lab Results  Component Value Date   CHOL 147 08/04/2017   HDL 29 (L) 08/04/2017   LDLCALC 89 08/04/2017   TRIG 196 (H) 08/04/2017   CHOLHDL 5.1 (H) 08/04/2017   She has been working on diet and exercise for Diabetes with diabetic chronic kidney disease, with other circulatory complications and with diabetic polyneuropathy, she is on bASA, she is on ACE/ARB, he is on 65 and 65 in the evening, and denies paresthesia of the feet, polydipsia, polyuria and visual disturbances. Last A1C was:  Lab Results  Component Value Date   HGBA1C 7.1 (H) 08/04/2017   Last GFR  Lab Results  Component Value Date   GFRNONAA 14 (L) 11/05/2017   Patient is on Vitamin D supplement. Lab Results  Component Value Date   VD25OH 48 08/04/2017     Patient is on allopurinol for gout and does not report a recent flare.  Lab Results  Component Value Date   LABURIC 6.2 08/04/2017     Medication Review Current Outpatient Medications  on File Prior to Visit  Medication Sig Dispense Refill  . acetaminophen (TYLENOL) 500 MG tablet Take 1,000 mg by mouth every 6 (six) hours as needed.    Marland Kitchen allopurinol (ZYLOPRIM) 100 MG tablet Take 100 mg by mouth 2 (two) times daily.    Marland Kitchen allopurinol (ZYLOPRIM) 100 MG tablet Take 100 mg by mouth 2 (two) times daily.    Marland Kitchen anastrozole (ARIMIDEX) 1 MG tablet Take 1 tablet (1 mg total) by mouth daily. 90 tablet 3  . anastrozole (ARIMIDEX) 1 MG tablet Take 1 mg by mouth daily.    Marland Kitchen aspirin EC 81 MG tablet Take 1 tablet (81 mg total) by mouth daily.    Marland Kitchen aspirin EC 81 MG tablet Take 81 mg by mouth daily.    Marland Kitchen aspirin-acetaminophen-caffeine (EXCEDRIN MIGRAINE) 250-250-65 MG tablet Take 1 tablet by mouth daily as needed (for sinus headaches.).    Marland Kitchen atorvastatin (LIPITOR) 80 MG tablet TAKE 1 TABLET DAILY AT 6 PM 90 tablet 0  . atorvastatin (LIPITOR) 80 MG tablet Take 80 mg by mouth daily.    . beta carotene w/minerals (OCUVITE) tablet Take 1 tablet by mouth daily.    . bumetanide (BUMEX) 2 MG tablet Take 1 tablet (2 mg total) by mouth 2 (two) times daily. 60 tablet 1  . carboxymethylcellulose (REFRESH PLUS) 0.5 % SOLN Place 1 drop into both eyes daily as needed (dry eyes).    Marland Kitchen  carvedilol (COREG) 25 MG tablet TAKE 1 TABLET TWICE DAILY WITH MEALS 180 tablet 0  . carvedilol (COREG) 25 MG tablet Take 25 mg by mouth 2 (two) times daily.    . Cholecalciferol (VITAMIN D3) 2000 units TABS Take 2,000-4,000 Units by mouth daily.     . clindamycin (CLEOCIN) 300 MG capsule Take 1 capsule (300 mg total) by mouth 3 (three) times daily. 30 capsule 0  . docusate sodium (COLACE) 100 MG capsule Take 100 mg by mouth at bedtime.    . fluticasone (FLONASE) 50 MCG/ACT nasal spray Place 2 sprays into both nostrils daily as needed for allergies or rhinitis.    . furosemide (LASIX) 40 MG tablet Take 40 mg by mouth 2 (two) times daily. 2 tablets (80 mg ) am 1-2 tablets pm    . hydrALAZINE (APRESOLINE) 25 MG tablet TAKE 3  TABLETS THREE TIMES DAILY 540 tablet 1  . hydrALAZINE (APRESOLINE) 25 MG tablet Take 75 mg by mouth 3 (three) times daily.    . hydrALAZINE (APRESOLINE) 25 MG tablet TAKE 3 TABLETS THREE TIMES DAILY 810 tablet 1  . insulin aspart (NOVOLOG) 100 UNIT/ML FlexPen Inject 3 Units into the skin daily. With your largest meal of the day 9 mL 1  . insulin glargine (LANTUS) 100 UNIT/ML injection Inject 0.05 mLs (5 Units total) into the skin daily. 10 mL 1  . insulin NPH-regular Human (NOVOLIN 70/30) (70-30) 100 UNIT/ML injection Inject 30-65 Units into the skin See admin instructions. 60-65 units into the skin in the morning before breakfast & 30-35 units in the evening, depending on BGL    . isosorbide dinitrate (ISORDIL) 10 MG tablet TAKE 1 TABLET THREE TIMES DAILY 270 tablet 0  . isosorbide dinitrate (ISORDIL) 10 MG tablet Take 10 mg by mouth 3 (three) times daily.    . Multiple Vitamins-Minerals (OCUVITE EYE HEALTH FORMULA PO) Take 1 tablet by mouth daily.    . NON FORMULARY [employer] Tonic and Homeopathic Remedy: Mix 1 ounce with water and drink one to two times a day (5-6 times a week)    . oxyCODONE (ROXICODONE) 5 MG immediate release tablet Take 1 tablet (5 mg total) by mouth every 6 (six) hours as needed for severe pain. 6 tablet 0  . Polyethyl Glycol-Propyl Glycol (LUBRICANT EYE DROPS) 0.4-0.3 % SOLN Place 1-2 drops into both eyes 3 (three) times daily as needed (for dry eyes.).    Marland Kitchen sodium chloride (OCEAN) 0.65 % SOLN nasal spray Place 1 spray into both nostrils as needed for congestion.    . Vitamin D, Ergocalciferol, 2000 units CAPS Take 2,000 Units by mouth daily.    . Wound Dressings (MEDIHONEY WOUND/BURN DRESSING) GEL Apply to affected are 3 times a week, and cover with sterile dressing. 1 Tube 5   No current facility-administered medications on file prior to visit.     Current Problems (verified) Patient Active Problem List   Diagnosis Date Noted  . Altered mental status  11/05/2017  . Coronary artery disease involving coronary bypass graft of native heart without angina pectoris   . Bradycardia   . Anemia 05/03/2017  . Chronic kidney disease (CKD), active medical management without dialysis, stage 5 ([place]) 05/08/2015  . Diabetes mellitus type 2, insulin dependent ([place]) 05/08/2015  . BMI 29.0-29.9,adult 02/14/2015  . Medication management 08/04/2014  . Type II diabetes mellitus with neurological manifestations, uncontrolled ([place]) 02/17/2014  . Gouty arthritis   . Vitamin D deficiency   . Malignant neoplasm of  upper-inner quadrant of left breast in female, estrogen receptor positive ([place]) 02/14/2013  . Peripheral vascular disease ([place]) 09/05/2009  . Hyperlipidemia 06/20/2009  . Essential hypertension 06/20/2009  . Congestive heart failure ([place]) 06/20/2009  . EMPHYSEMA 06/20/2009  . Asthma 06/20/2009    Screening Tests Immunization History  Administered Date(s) Administered  . Influenza, High Dose Seasonal PF 01/30/2014, 02/14/2015, 12/17/2015, 01/22/2017  . Influenza-Unspecified 02/05/2013  . Pneumococcal Conjugate-13 01/30/2014  . Pneumococcal Polysaccharide-23 01/07/2011  . Td 01/13/2012  . Zoster 01/13/2012    Preventative care: Last colonoscopy: 2011 Last mammogram: 12/2016 DEXA: 12/2016 Echo 2011  Prior vaccinations: TD or Tdap: 2013  Influenza: 2018  Pneumococcal: 2012 Prevnar13: 2015 Shingles/Zostavax: 2013  Names of Other Physician/Practitioners you currently use: 1. [facility]- here for primary care 2. Dr. Bing Plume, eye doctor, last visit 2017 *** needs diabetic eye *** 3. Dr. Satira Sark, Dr. Mallie Snooks , dentist, last visit feb 2018  Patient Care Team: Unk Pinto, MD as PCP - General (Internal Medicine) Magrinat, Virgie Dad, MD as Consulting Physician (Oncology) Kyung Rudd, MD as Consulting Physician (Radiation Oncology) Newt Minion, MD as Consulting Physician (Orthopedic  Surgery) Philemon Kingdom, MD as Consulting Physician (Internal Medicine) Josue Hector, MD as Consulting Physician (Cardiology) Inda Castle, MD (Inactive) as Consulting Physician (Gastroenterology) Rolm Bookbinder, MD as Consulting Physician (General Surgery) Calvert Cantor, MD as Consulting Physician (Ophthalmology) Madelon Lips, MD as Consulting Physician (Nephrology) Madelon Lips, MD as Consulting Physician (Nephrology) Unk Pinto, MD (Internal Medicine) Allergies Allergies  Allergen Reactions  . Atenolol Other (See Comments)    Exacerbates gout  . Atenolol Other (See Comments)    Connection to gout ?  Marland Kitchen Contrast Media [Iodinated Diagnostic Agents] Rash  . Latex Rash  . Latex Rash  . Omnipaque [Iohexol] Rash    SURGICAL HISTORY She  has a past surgical history that includes Cataract extraction w/ intraocular lens  implant, bilateral (2000's); Finger surgery (Right); Coronary artery bypass graft (2011); Dental surgery (04/30/11); AMPUTATION RAY (Right, 08/27/2012); Cardiac catheterization (05/24/2009); Amputation (Right, 08/27/2012); Eye surgery; Colonoscopy w/ biopsies and polypectomy (2010); Breast lumpectomy (04/20/2013); Breast lumpectomy with needle localization and axillary sentinel lymph node bx (Left, 04/20/2013); Dilation and curettage of uterus; Amputation (Left, 07/08/2013); Amputation toe (Right, 03/06/2017); and AV fistula placement (Right, 11/11/2017). FAMILY HISTORY Her family history includes Breast cancer in her maternal aunt; Breast cancer (age of onset: 95) in her sister; Breast cancer (age of onset: 12) in her sister; Hypertension in her brother; Kidney cancer (age of onset: 73) in her brother; Throat cancer (age of onset: 74) in her father. SOCIAL HISTORY She  reports that she has quit smoking. She has never used smokeless tobacco. She reports that she drinks alcohol. She reports that she does not use drugs.  MEDICARE WELLNESS OBJECTIVES: Physical  activity:   Cardiac risk factors:   Depression/mood screen:   Depression screen [facility] 2/9 10/13/2016  Decreased Interest 0  Down, Depressed, Hopeless 0  PHQ - 2 Score 0  Some recent data might be hidden    ADLs:  In your present state of health, do you have any difficulty performing the following activities: 11/03/2017 05/04/2017  Hearing? N -  Vision? N -  Difficulty concentrating or making decisions? N -  Walking or climbing stairs? Y -  Dressing or bathing? Y -  Doing errands, shopping? Y N  Some recent data might be hidden     Cognitive Testing  Alert? Yes  Normal Appearance?Yes  Oriented to person? Yes  Place? Yes   Time? Yes  Recall of three objects?  Yes  Can perform simple calculations? Yes  Displays appropriate judgment?Yes  Can read the correct time from a watch face?Yes  EOL planning:     Objective:   There were no vitals filed for this visit. There is no height or weight on file to calculate BMI.  General appearance: alert, no distress, WD/WN,  female HEENT: normocephalic, sclerae anicteric, TMs pearly, nares patent, no discharge or erythema, pharynx normal Oral cavity: MMM, no lesions Neck: supple, no lymphadenopathy, no thyromegaly, no masses Heart: RRR, normal S1, S2, no murmurs Lungs: CTA bilaterally, no wheezes, rhonchi, or rales Abdomen: +bs, soft, non tender, non distended, no masses, no hepatomegaly, no splenomegaly Musculoskeletal: nontender, no swelling, no obvious deformity Extremities: no edema, no cyanosis, no clubbing Pulses: 2+ symmetric, upper and lower extremities, normal cap refill Neurological: alert, oriented x 3, CN2-12 intact, strength normal upper extremities and lower extremities, sensation normal throughout, DTRs 2+ throughout, no cerebellar signs, gait normal Psychiatric: normal affect, behavior normal, pleasant  Breast: defer Gyn: defer Rectal: defer   Medicare Attestation I have personally reviewed: The patient's medical and  social history Their use of alcohol, tobacco or illicit drugs Their current medications and supplements The patient's functional ability including ADLs,fall risks, home safety risks, cognitive, and hearing and visual impairment Diet and physical activities Evidence for depression or mood disorders  The patient's weight, height, BMI, and visual acuity have been recorded in the chart.  I have made referrals, counseling, and provided education to the patient based on review of the above and I have provided the patient with a written personalized care plan for preventive services.     [name], NP   11/25/2017

## 2017-11-26 ENCOUNTER — Encounter: Payer: Self-pay | Admitting: Adult Health

## 2017-11-26 ENCOUNTER — Encounter (HOSPITAL_COMMUNITY): Payer: Self-pay

## 2017-11-26 ENCOUNTER — Inpatient Hospital Stay (HOSPITAL_COMMUNITY)
Admission: EM | Admit: 2017-11-26 | Discharge: 2017-12-04 | DRG: 291 | Disposition: A | Discharge status: Home Health | Payer: Medicare Other | Attending: Internal Medicine | Admitting: Internal Medicine

## 2017-11-26 ENCOUNTER — Other Ambulatory Visit: Payer: Self-pay

## 2017-11-26 ENCOUNTER — Emergency Department (HOSPITAL_COMMUNITY): Payer: Medicare Other

## 2017-11-26 ENCOUNTER — Ambulatory Visit (INDEPENDENT_AMBULATORY_CARE_PROVIDER_SITE_OTHER): Payer: Medicare Other | Admitting: Adult Health

## 2017-11-26 VITALS — BP 138/70 | HR 60 | Temp 96.8°F

## 2017-11-26 DIAGNOSIS — K5900 Constipation, unspecified: Secondary | ICD-10-CM | POA: Diagnosis not present

## 2017-11-26 DIAGNOSIS — R404 Transient alteration of awareness: Secondary | ICD-10-CM | POA: Diagnosis present

## 2017-11-26 DIAGNOSIS — J810 Acute pulmonary edema: Secondary | ICD-10-CM

## 2017-11-26 DIAGNOSIS — Z91040 Latex allergy status: Secondary | ICD-10-CM

## 2017-11-26 DIAGNOSIS — G9341 Metabolic encephalopathy: Secondary | ICD-10-CM | POA: Diagnosis not present

## 2017-11-26 DIAGNOSIS — E162 Hypoglycemia, unspecified: Secondary | ICD-10-CM | POA: Diagnosis not present

## 2017-11-26 DIAGNOSIS — E1165 Type 2 diabetes mellitus with hyperglycemia: Secondary | ICD-10-CM | POA: Diagnosis not present

## 2017-11-26 DIAGNOSIS — Z87891 Personal history of nicotine dependence: Secondary | ICD-10-CM

## 2017-11-26 DIAGNOSIS — R0602 Shortness of breath: Secondary | ICD-10-CM | POA: Diagnosis not present

## 2017-11-26 DIAGNOSIS — R001 Bradycardia, unspecified: Secondary | ICD-10-CM | POA: Diagnosis present

## 2017-11-26 DIAGNOSIS — S299XXA Unspecified injury of thorax, initial encounter: Secondary | ICD-10-CM | POA: Diagnosis not present

## 2017-11-26 DIAGNOSIS — T502X5A Adverse effect of carbonic-anhydrase inhibitors, benzothiadiazides and other diuretics, initial encounter: Secondary | ICD-10-CM | POA: Diagnosis present

## 2017-11-26 DIAGNOSIS — E11621 Type 2 diabetes mellitus with foot ulcer: Secondary | ICD-10-CM | POA: Diagnosis present

## 2017-11-26 DIAGNOSIS — I10 Essential (primary) hypertension: Secondary | ICD-10-CM | POA: Diagnosis present

## 2017-11-26 DIAGNOSIS — E161 Other hypoglycemia: Secondary | ICD-10-CM | POA: Diagnosis not present

## 2017-11-26 DIAGNOSIS — N185 Chronic kidney disease, stage 5: Secondary | ICD-10-CM

## 2017-11-26 DIAGNOSIS — Z961 Presence of intraocular lens: Secondary | ICD-10-CM | POA: Diagnosis present

## 2017-11-26 DIAGNOSIS — Z951 Presence of aortocoronary bypass graft: Secondary | ICD-10-CM

## 2017-11-26 DIAGNOSIS — I509 Heart failure, unspecified: Secondary | ICD-10-CM

## 2017-11-26 DIAGNOSIS — I5043 Acute on chronic combined systolic (congestive) and diastolic (congestive) heart failure: Secondary | ICD-10-CM | POA: Diagnosis present

## 2017-11-26 DIAGNOSIS — I132 Hypertensive heart and chronic kidney disease with heart failure and with stage 5 chronic kidney disease, or end stage renal disease: Principal | ICD-10-CM | POA: Diagnosis present

## 2017-11-26 DIAGNOSIS — I2510 Atherosclerotic heart disease of native coronary artery without angina pectoris: Secondary | ICD-10-CM

## 2017-11-26 DIAGNOSIS — N186 End stage renal disease: Secondary | ICD-10-CM | POA: Diagnosis not present

## 2017-11-26 DIAGNOSIS — Z923 Personal history of irradiation: Secondary | ICD-10-CM

## 2017-11-26 DIAGNOSIS — Z853 Personal history of malignant neoplasm of breast: Secondary | ICD-10-CM

## 2017-11-26 DIAGNOSIS — R400 Somnolence: Secondary | ICD-10-CM

## 2017-11-26 DIAGNOSIS — R531 Weakness: Secondary | ICD-10-CM | POA: Diagnosis not present

## 2017-11-26 DIAGNOSIS — E785 Hyperlipidemia, unspecified: Secondary | ICD-10-CM | POA: Diagnosis present

## 2017-11-26 DIAGNOSIS — R4182 Altered mental status, unspecified: Secondary | ICD-10-CM | POA: Diagnosis present

## 2017-11-26 DIAGNOSIS — E1149 Type 2 diabetes mellitus with other diabetic neurological complication: Secondary | ICD-10-CM | POA: Diagnosis present

## 2017-11-26 DIAGNOSIS — E782 Mixed hyperlipidemia: Secondary | ICD-10-CM | POA: Diagnosis present

## 2017-11-26 DIAGNOSIS — R0902 Hypoxemia: Secondary | ICD-10-CM | POA: Diagnosis present

## 2017-11-26 DIAGNOSIS — N179 Acute kidney failure, unspecified: Secondary | ICD-10-CM | POA: Diagnosis present

## 2017-11-26 DIAGNOSIS — E119 Type 2 diabetes mellitus without complications: Secondary | ICD-10-CM

## 2017-11-26 DIAGNOSIS — Z9841 Cataract extraction status, right eye: Secondary | ICD-10-CM

## 2017-11-26 DIAGNOSIS — I25810 Atherosclerosis of coronary artery bypass graft(s) without angina pectoris: Secondary | ICD-10-CM | POA: Diagnosis present

## 2017-11-26 DIAGNOSIS — E1140 Type 2 diabetes mellitus with diabetic neuropathy, unspecified: Secondary | ICD-10-CM | POA: Diagnosis present

## 2017-11-26 DIAGNOSIS — I491 Atrial premature depolarization: Secondary | ICD-10-CM | POA: Diagnosis not present

## 2017-11-26 DIAGNOSIS — I252 Old myocardial infarction: Secondary | ICD-10-CM

## 2017-11-26 DIAGNOSIS — E11649 Type 2 diabetes mellitus with hypoglycemia without coma: Secondary | ICD-10-CM | POA: Diagnosis present

## 2017-11-26 DIAGNOSIS — Z91041 Radiographic dye allergy status: Secondary | ICD-10-CM

## 2017-11-26 DIAGNOSIS — N2581 Secondary hyperparathyroidism of renal origin: Secondary | ICD-10-CM | POA: Diagnosis present

## 2017-11-26 DIAGNOSIS — Z888 Allergy status to other drugs, medicaments and biological substances status: Secondary | ICD-10-CM

## 2017-11-26 DIAGNOSIS — Z992 Dependence on renal dialysis: Secondary | ICD-10-CM

## 2017-11-26 DIAGNOSIS — D631 Anemia in chronic kidney disease: Secondary | ICD-10-CM | POA: Diagnosis present

## 2017-11-26 DIAGNOSIS — M109 Gout, unspecified: Secondary | ICD-10-CM | POA: Diagnosis present

## 2017-11-26 DIAGNOSIS — E876 Hypokalemia: Secondary | ICD-10-CM | POA: Diagnosis present

## 2017-11-26 DIAGNOSIS — Z89421 Acquired absence of other right toe(s): Secondary | ICD-10-CM

## 2017-11-26 DIAGNOSIS — I058 Other rheumatic mitral valve diseases: Secondary | ICD-10-CM

## 2017-11-26 DIAGNOSIS — Z9842 Cataract extraction status, left eye: Secondary | ICD-10-CM

## 2017-11-26 DIAGNOSIS — Z794 Long term (current) use of insulin: Secondary | ICD-10-CM

## 2017-11-26 DIAGNOSIS — E1122 Type 2 diabetes mellitus with diabetic chronic kidney disease: Secondary | ICD-10-CM | POA: Diagnosis present

## 2017-11-26 LAB — BRAIN NATRIURETIC PEPTIDE: B Natriuretic Peptide: 2382.1 pg/mL — ABNORMAL HIGH (ref 0.0–100.0)

## 2017-11-26 LAB — CBC WITH DIFFERENTIAL/PLATELET
ABS IMMATURE GRANULOCYTES: 0 10*3/uL (ref 0.0–0.1)
BASOS PCT: 0 %
Basophils Absolute: 0 10*3/uL (ref 0.0–0.1)
Eosinophils Absolute: 0.1 10*3/uL (ref 0.0–0.7)
Eosinophils Relative: 1 %
HCT: 33.8 % — ABNORMAL LOW (ref 36.0–46.0)
Hemoglobin: 10.4 g/dL — ABNORMAL LOW (ref 12.0–15.0)
IMMATURE GRANULOCYTES: 0 %
Lymphocytes Relative: 12 %
Lymphs Abs: 0.9 10*3/uL (ref 0.7–4.0)
MCH: 27.2 pg (ref 26.0–34.0)
MCHC: 30.8 g/dL (ref 30.0–36.0)
MCV: 88.3 fL (ref 78.0–100.0)
MONOS PCT: 10 %
Monocytes Absolute: 0.8 10*3/uL (ref 0.1–1.0)
NEUTROS PCT: 77 %
Neutro Abs: 6 10*3/uL (ref 1.7–7.7)
PLATELETS: 180 10*3/uL (ref 150–400)
RBC: 3.83 MIL/uL — ABNORMAL LOW (ref 3.87–5.11)
RDW: 17.1 % — ABNORMAL HIGH (ref 11.5–15.5)
WBC: 7.8 10*3/uL (ref 4.0–10.5)

## 2017-11-26 LAB — BLOOD GAS, ARTERIAL
Acid-base deficit: 2 mmol/L (ref 0.0–2.0)
BICARBONATE: 22.6 mmol/L (ref 20.0–28.0)
FIO2: 0.21
O2 Saturation: 89 %
PCO2 ART: 41.3 mmHg (ref 32.0–48.0)
PH ART: 7.358 (ref 7.350–7.450)
PO2 ART: 59.8 mmHg — AB (ref 83.0–108.0)
Patient temperature: 98.6

## 2017-11-26 LAB — COMPREHENSIVE METABOLIC PANEL
ALK PHOS: 107 U/L (ref 38–126)
ALT: 40 U/L (ref 0–44)
ANION GAP: 11 (ref 5–15)
AST: 34 U/L (ref 15–41)
Albumin: 3 g/dL — ABNORMAL LOW (ref 3.5–5.0)
BUN: 104 mg/dL — ABNORMAL HIGH (ref 8–23)
CALCIUM: 8.7 mg/dL — AB (ref 8.9–10.3)
CHLORIDE: 107 mmol/L (ref 98–111)
CO2: 22 mmol/L (ref 22–32)
Creatinine, Ser: 3.99 mg/dL — ABNORMAL HIGH (ref 0.44–1.00)
GFR calc non Af Amer: 10 mL/min — ABNORMAL LOW (ref >60)
GFR, EST AFRICAN AMERICAN: 12 mL/min — AB (ref >60)
Glucose, Bld: 98 mg/dL (ref 70–99)
POTASSIUM: 4.2 mmol/L (ref 3.5–5.1)
SODIUM: 140 mmol/L (ref 135–145)
Total Bilirubin: 0.8 mg/dL (ref 0.3–1.2)
Total Protein: 5.3 g/dL — ABNORMAL LOW (ref 6.5–8.1)

## 2017-11-26 LAB — URINALYSIS, ROUTINE W REFLEX MICROSCOPIC
Bilirubin Urine: NEGATIVE
Glucose, UA: NEGATIVE mg/dL
Hgb urine dipstick: NEGATIVE
Ketones, ur: NEGATIVE mg/dL
LEUKOCYTES UA: NEGATIVE
NITRITE: NEGATIVE
Protein, ur: 30 mg/dL — AB
SPECIFIC GRAVITY, URINE: 1.008 (ref 1.005–1.030)
pH: 5 (ref 5.0–8.0)

## 2017-11-26 LAB — T4, FREE: Free T4: 1.39 ng/dL (ref 0.82–1.77)

## 2017-11-26 LAB — I-STAT CHEM 8, ED
BUN: 104 mg/dL — ABNORMAL HIGH (ref 8–23)
CHLORIDE: 104 mmol/L (ref 98–111)
Calcium, Ion: 1.18 mmol/L (ref 1.15–1.40)
Creatinine, Ser: 4.1 mg/dL — ABNORMAL HIGH (ref 0.44–1.00)
GLUCOSE: 89 mg/dL (ref 70–99)
HCT: 34 % — ABNORMAL LOW (ref 36.0–46.0)
Hemoglobin: 11.6 g/dL — ABNORMAL LOW (ref 12.0–15.0)
POTASSIUM: 4 mmol/L (ref 3.5–5.1)
Sodium: 137 mmol/L (ref 135–145)
TCO2: 24 mmol/L (ref 22–32)

## 2017-11-26 LAB — MAGNESIUM: MAGNESIUM: 2.7 mg/dL — AB (ref 1.7–2.4)

## 2017-11-26 LAB — RAPID URINE DRUG SCREEN, HOSP PERFORMED
AMPHETAMINES: NOT DETECTED
BARBITURATES: NOT DETECTED
BENZODIAZEPINES: NOT DETECTED
Cocaine: NOT DETECTED
Opiates: NOT DETECTED
Tetrahydrocannabinol: NOT DETECTED

## 2017-11-26 LAB — PROTIME-INR
INR: 1.24
Prothrombin Time: 15.5 seconds — ABNORMAL HIGH (ref 11.4–15.2)

## 2017-11-26 LAB — GLUCOSE, CAPILLARY
GLUCOSE-CAPILLARY: 35 mg/dL — AB (ref 70–99)
Glucose-Capillary: 35 mg/dL — CL (ref 70–99)
Glucose-Capillary: 159 mg/dL — ABNORMAL HIGH (ref 70–99)
Glucose-Capillary: 170 mg/dL — ABNORMAL HIGH (ref 70–99)

## 2017-11-26 LAB — HEMOGLOBIN A1C
HEMOGLOBIN A1C: 6.1 % — AB (ref 4.8–5.6)
MEAN PLASMA GLUCOSE: 128.37 mg/dL

## 2017-11-26 LAB — CBG MONITORING, ED: Glucose-Capillary: 111 mg/dL — ABNORMAL HIGH (ref 70–99)

## 2017-11-26 LAB — I-STAT TROPONIN, ED: TROPONIN I, POC: 0.09 ng/mL — AB (ref 0.00–0.08)

## 2017-11-26 LAB — TSH
TSH: 3.201 u[IU]/mL (ref 0.350–4.500)
TSH: 2.307 u[IU]/mL (ref 0.350–4.500)

## 2017-11-26 LAB — AMMONIA: Ammonia: 59 umol/L — ABNORMAL HIGH (ref 9–35)

## 2017-11-26 MED ORDER — ASPIRIN EC 81 MG PO TBEC
81.0000 mg | DELAYED_RELEASE_TABLET | Freq: Every day | ORAL | Status: DC
  Administered 2017-11-26 – 2017-12-04 (×9): 81 mg via ORAL
  Filled 2017-11-26 (×9): qty 1

## 2017-11-26 MED ORDER — LEVALBUTEROL HCL 0.63 MG/3ML IN NEBU: 0.6300 mg | INHALATION_SOLUTION | Freq: Four times a day (QID) | RESPIRATORY_TRACT | Status: DC | PRN

## 2017-11-26 MED ORDER — ISOSORBIDE DINITRATE 10 MG PO TABS
10.0000 mg | ORAL_TABLET | Freq: Three times a day (TID) | ORAL | Status: DC
  Administered 2017-11-26 – 2017-12-04 (×22): 10 mg via ORAL
  Filled 2017-11-26 (×23): qty 1

## 2017-11-26 MED ORDER — ACETAMINOPHEN 325 MG PO TABS
650.0000 mg | ORAL_TABLET | Freq: Four times a day (QID) | ORAL | Status: DC | PRN
  Administered 2017-11-26 – 2017-12-03 (×9): 650 mg via ORAL
  Filled 2017-11-26 (×7): qty 2

## 2017-11-26 MED ORDER — SODIUM CHLORIDE 0.9% FLUSH
3.0000 mL | Freq: Two times a day (BID) | INTRAVENOUS | Status: DC
  Administered 2017-11-26 – 2017-12-04 (×11): 3 mL via INTRAVENOUS

## 2017-11-26 MED ORDER — ONDANSETRON HCL 4 MG/2ML IJ SOLN
4.0000 mg | Freq: Four times a day (QID) | INTRAMUSCULAR | Status: DC | PRN
  Administered 2017-11-26 – 2017-12-01 (×3): 4 mg via INTRAVENOUS
  Filled 2017-11-26: qty 2

## 2017-11-26 MED ORDER — DEXTROSE 50 % IV SOLN
INTRAVENOUS | Status: AC
  Administered 2017-11-26: 17:00:00
  Filled 2017-11-26: qty 50

## 2017-11-26 MED ORDER — HEPARIN SODIUM (PORCINE) 5000 UNIT/ML IJ SOLN
5000.0000 [IU] | Freq: Three times a day (TID) | INTRAMUSCULAR | Status: AC
  Administered 2017-11-26 – 2017-11-29 (×10): 5000 [IU] via SUBCUTANEOUS
  Filled 2017-11-26 (×10): qty 1

## 2017-11-26 MED ORDER — SODIUM CHLORIDE 0.9 % IV SOLN: 250.0000 mL | INTRAVENOUS | Status: DC | PRN

## 2017-11-26 MED ORDER — FUROSEMIDE 10 MG/ML IJ SOLN
120.0000 mg | Freq: Four times a day (QID) | INTRAVENOUS | Status: DC
  Administered 2017-11-26 – 2017-11-27 (×2): 120 mg via INTRAVENOUS
  Filled 2017-11-26: qty 2
  Filled 2017-11-26: qty 10
  Filled 2017-11-26 (×2): qty 12

## 2017-11-26 MED ORDER — NALOXONE HCL 0.4 MG/ML IJ SOLN
0.4000 mg | Freq: Once | INTRAMUSCULAR | Status: AC
  Administered 2017-11-26: 0.4 mg via INTRAVENOUS
  Filled 2017-11-26: qty 1

## 2017-11-26 MED ORDER — FUROSEMIDE 10 MG/ML IJ SOLN: 60.0000 mg | Freq: Two times a day (BID) | INTRAMUSCULAR | Status: DC

## 2017-11-26 MED ORDER — MAGNESIUM SULFATE IN D5W 1-5 GM/100ML-% IV SOLN
1.0000 g | Freq: Once | INTRAVENOUS | Status: AC
  Administered 2017-11-26: 1 g via INTRAVENOUS
  Filled 2017-11-26: qty 100

## 2017-11-26 MED ORDER — ONDANSETRON HCL 4 MG PO TABS
4.0000 mg | ORAL_TABLET | Freq: Four times a day (QID) | ORAL | Status: DC | PRN
  Administered 2017-11-28: 4 mg via ORAL
  Filled 2017-11-26: qty 1

## 2017-11-26 MED ORDER — SODIUM CHLORIDE 0.9% FLUSH: 3.0000 mL | INTRAVENOUS | Status: DC | PRN

## 2017-11-26 MED ORDER — ACETAMINOPHEN 650 MG RE SUPP: 650.0000 mg | Freq: Four times a day (QID) | RECTAL | Status: DC | PRN

## 2017-11-26 MED ORDER — MUSCLE RUB 10-15 % EX CREA
TOPICAL_CREAM | CUTANEOUS | Status: DC | PRN
  Administered 2017-11-26 – 2017-11-30 (×4): via TOPICAL
  Filled 2017-11-26: qty 85

## 2017-11-26 MED ORDER — DEXTROSE 50 % IV SOLN: 50.0000 mL | INTRAVENOUS | Status: DC | PRN

## 2017-11-26 MED ORDER — FUROSEMIDE 10 MG/ML IJ SOLN
60.0000 mg | Freq: Once | INTRAMUSCULAR | Status: AC
  Administered 2017-11-26: 60 mg via INTRAVENOUS
  Filled 2017-11-26: qty 6

## 2017-11-26 MED ORDER — NALOXONE NEWBORN-WH INJECTION 0.4 MG/ML: 0.4000 mg | Freq: Once | INTRAMUSCULAR | Status: DC

## 2017-11-26 MED ORDER — DEXTROSE 10 % IV SOLN
INTRAVENOUS | Status: DC
  Administered 2017-11-26 – 2017-11-27 (×2): via INTRAVENOUS

## 2017-11-26 MED ORDER — FLUTICASONE PROPIONATE 50 MCG/ACT NA SUSP: 2.0000 | Freq: Every day | NASAL | Status: DC | PRN

## 2017-11-26 MED ORDER — HYDRALAZINE HCL 10 MG PO TABS
10.0000 mg | ORAL_TABLET | Freq: Three times a day (TID) | ORAL | Status: DC
  Administered 2017-11-26 – 2017-12-04 (×23): 10 mg via ORAL
  Filled 2017-11-26 (×23): qty 1

## 2017-11-26 MED ORDER — INSULIN ASPART 100 UNIT/ML ~~LOC~~ SOLN
0.0000 [IU] | Freq: Three times a day (TID) | SUBCUTANEOUS | Status: DC
  Administered 2017-11-27: 3 [IU] via SUBCUTANEOUS
  Administered 2017-11-27 – 2017-11-28 (×4): 2 [IU] via SUBCUTANEOUS
  Administered 2017-11-28: 5 [IU] via SUBCUTANEOUS
  Administered 2017-11-29: 2 [IU] via SUBCUTANEOUS
  Administered 2017-11-29 (×2): 3 [IU] via SUBCUTANEOUS
  Administered 2017-11-30 – 2017-12-02 (×7): 2 [IU] via SUBCUTANEOUS
  Administered 2017-12-03: 1 [IU] via SUBCUTANEOUS
  Administered 2017-12-03 – 2017-12-04 (×2): 2 [IU] via SUBCUTANEOUS

## 2017-11-26 MED ORDER — ATORVASTATIN CALCIUM 80 MG PO TABS
80.0000 mg | ORAL_TABLET | Freq: Every day | ORAL | Status: DC
  Administered 2017-11-26 – 2017-12-03 (×8): 80 mg via ORAL
  Filled 2017-11-26 (×8): qty 1

## 2017-11-26 NOTE — Progress Notes (Signed)
ABG results given to ordering MD. 2lpm [place] placed on pt D/T ABG results.

## 2017-11-26 NOTE — ED Provider Notes (Signed)
Emergency Department Provider Note   I have reviewed the triage vital signs and the nursing notes.   HISTORY  Chief Complaint Bradycardia   HPI [name] is a 72 y.o. female with CKD nearing HD, CHF, HLD, HTN, PAD, and DM Zentz to the emergency department for evaluation of neurolysed weakness in the setting of bradycardia.  The patient was visiting her PCP when the bradycardia was identified.  Paramedics arrived to find the patient with palpable rate in the 30s with bigeminy.  She was given 0.5 mg of atropine and IV fluids in route.  The patient did have a fall more than a month ago and presented to the emergency department at that time with a normal CT head.  She denies any fevers or chills.  No significant medication changes.  Denies any chest pain or heart palpitations. Patient had a right arm AV fistula placed on 11/11/17 in preparation for HD but has not yet been accessed.    Past Medical History:  Diagnosis Date  . Allergy   . Arthritis    "maybe in my lower back" (08/27/2012)  . 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 Chronic combined systolic and diastolic CHF (congestive heart failure) ([place])   . CKD (chronic kidney disease), stage IV ([place])   . Coronary artery disease    a. NSTEMI in 05/2009 s/p CABG (LIMA-LAD, SVG-diagonal, SVG-OM1/OM 2).   Marland Kitchen Dyspnea    with exertion  . Family history of anesthesia complication    Mom has a hard time to wake up  . Foot ulcer ([place])    "I've had them on both feet" (08/27/2012)  . Gouty arthritis    "right index finger" (08/27/2012)  . History of radiation therapy 06/09/13-07/06/13   left breast 50Gy  . Hyperlipidemia   . Hypertension   . Infection    right second toe  . Ischemic cardiomyopathy    a. 2011: EF 40-45%. b. EF 55-60% in 04/2017 but shortly after as inpatient was 45-50%.  Marland Kitchen LBBB (left bundle branch block)   . Mitral regurgitation   . Neuropathy    Hx; of B/L feet  .  NSTEMI (non-ST elevated myocardial infarction) ([place]) 05/23/2009  . Osteomyelitis of foot ([place])   . PAD (peripheral artery disease) (HCC)    a. LE PAD (patient previously elected hold off L fem-pop), prev followed by Dr. Bridgett Larsson  . PONV (postoperative nausea and vomiting)   . Sinus headache    "occasionally" (08/27/2012)  . Type II diabetes mellitus ([place])   . Vitamin D deficiency     Patient Active Problem List   Diagnosis Date Noted  . Altered mental status 11/05/2017  . Coronary artery disease involving coronary bypass graft of native heart without angina pectoris   . Bradycardia   . Anemia 05/03/2017  . Chronic kidney disease (CKD), active medical management without dialysis, stage 5 ([place]) 05/08/2015  . Diabetes mellitus type 2, insulin dependent ([place]) 05/08/2015  . BMI 29.0-29.9,adult 02/14/2015  . Medication management 08/04/2014  . Type II diabetes mellitus with neurological manifestations, uncontrolled ([place]) 02/17/2014  . Gouty arthritis   . Vitamin D deficiency   . Malignant neoplasm of upper-inner quadrant of left breast in female, estrogen receptor positive ([place]) 02/14/2013  . Peripheral vascular disease ([place]) 09/05/2009  . Hyperlipidemia 06/20/2009  . Essential hypertension 06/20/2009  . Congestive heart failure ([place]) 06/20/2009  . EMPHYSEMA 06/20/2009  . Asthma 06/20/2009    Past  Surgical History:  Procedure Laterality Date  . AMPUTATION Right 08/27/2012   Procedure: AMPUTATION RAY;  Surgeon: Newt Minion, MD;  Location: [facility];  Service: Orthopedics;  Laterality: Right;  Right Foot 5th Ray Amputation  . AMPUTATION Left 07/08/2013   Procedure: AMPUTATION RAY;  Surgeon: Newt Minion, MD;  Location: [facility];  Service: Orthopedics;  Laterality: Left;  Left Foot 2nd Ray Amputation  . AMPUTATION RAY Right 08/27/2012   5th ray/notes 08/27/2012  . AMPUTATION TOE Right 03/06/2017   Procedure: AMPUTATION TOE, INTERPHANGEAL 2ND RIGHT;  Surgeon: [name], DPM;  Location: [facility];  Service: Podiatry;  Laterality: Right;  . AV FISTULA PLACEMENT Right 11/11/2017   Procedure: RIGHT RADIOCEPHALIC  ARTERIOVENOUS FISTULA;  Surgeon: [name], MD;  Location: [facility];  Service: Vascular;  Laterality: Right;  . BREAST LUMPECTOMY  04/20/2013   with biopsy      DR WAKEFIELD  . BREAST LUMPECTOMY WITH NEEDLE LOCALIZATION AND AXILLARY SENTINEL LYMPH NODE BX Left 04/20/2013   Procedure: LEFT BREAST WIRE GUIDED LUMPECTOMY AND AXILLARY SENTINEL LYMPH NODE BX;  Surgeon: Rolm Bookbinder, MD;  Location: [place] [facility];  Service: General;  Laterality: Left;  . CARDIAC CATHETERIZATION  05/24/2009   Archie Endo 05/24/2009 (08/27/2012)  . CATARACT EXTRACTION W/ INTRAOCULAR LENS  IMPLANT, BILATERAL  2000's  . COLONOSCOPY W/ BIOPSIES AND POLYPECTOMY  2010  . CORONARY ARTERY BYPASS GRAFT  2011   "CABG X4" (08/27/2012)  . DENTAL SURGERY  04/30/11   "1 implant" (08/27/2012)  . DILATION AND CURETTAGE OF UTERUS    . EYE SURGERY    . FINGER SURGERY Right    "index finger; turned out to be gout" (08/27/2012)    Allergies Atenolol; Atenolol; Contrast media [iodinated diagnostic agents]; Latex; Latex; and Omnipaque [iohexol]  Family History  Problem Relation Age of Onset  . Kidney cancer Brother 10  . Hypertension Brother   . Breast cancer Sister 71       LCIS; BRCA negative  . Throat cancer Father 82       smoker  . Breast cancer Sister 49       Lobular breast cancer  . Breast cancer Maternal Aunt        dx in her 22s    Social History Social History   Tobacco Use  . Smoking status: Former Research scientist (life sciences)  . Smokeless tobacco: Never Used  Substance Use Topics  . Alcohol use: Yes    Comment: rarely drinks wine  . Drug use: No    Review of Systems  Constitutional: No fever/chills Eyes: No visual changes. ENT: No sore throat. Cardiovascular: Denies chest pain. Positive weakness.  Respiratory: Denies shortness of breath. Gastrointestinal: No abdominal pain.  No nausea, no vomiting.  No diarrhea.   No constipation. Genitourinary: Negative for dysuria. Musculoskeletal: Negative for back pain. Skin: Negative for rash. Neurological: Negative for headaches, focal weakness or numbness.  10-point ROS otherwise negative.  ____________________________________________   PHYSICAL EXAM:  VITAL SIGNS: HR: 31 BP: 141/66 SpO2: 98% RA RR: 18  Constitutional: Alert and oriented. Well appearing and in no acute distress. Eyes: Conjunctivae are normal. Head: Atraumatic. Nose: No congestion/rhinnorhea. Mouth/Throat: Mucous membranes are moist.  Neck: No stridor.   Cardiovascular: Sinus bradycardia with bigeminy. Good peripheral circulation. Grossly normal heart sounds.   Respiratory: Normal respiratory effort.  No retractions. Lungs CTAB. Gastrointestinal: Soft and nontender. Mild distention.  Musculoskeletal: No lower extremity tenderness nor edema. No gross deformities of extremities. Neurologic:  Normal speech and  language. No gross focal neurologic deficits are appreciated.  Skin:  Skin is warm, dry and intact. No rash noted.   ____________________________________________   LABS (all labs ordered are listed, but only abnormal results are displayed)  Labs Reviewed  COMPREHENSIVE METABOLIC PANEL - Abnormal; Notable for the following components:      Result Value   BUN 104 (*)    Creatinine, Ser 3.99 (*)    Calcium 8.7 (*)    Total Protein 5.3 (*)    Albumin 3.0 (*)    GFR calc non Af Amer 10 (*)    GFR calc Af Amer 12 (*)    All other components within normal limits  CBC WITH DIFFERENTIAL/PLATELET - Abnormal; Notable for the following components:   RBC 3.83 (*)    Hemoglobin 10.4 (*)    HCT 33.8 (*)    RDW 17.1 (*)    All other components within normal limits  MAGNESIUM - Abnormal; Notable for the following components:   Magnesium 2.7 (*)    All other components within normal limits  PROTIME-INR - Abnormal; Notable for the following components:   Prothrombin Time 15.5  (*)    All other components within normal limits  BRAIN NATRIURETIC PEPTIDE - Abnormal; Notable for the following components:   B Natriuretic Peptide 2,382.1 (*)    All other components within normal limits  CBG MONITORING, ED - Abnormal; Notable for the following components:   Glucose-Capillary 111 (*)    All other components within normal limits  I-STAT TROPONIN, ED - Abnormal; Notable for the following components:   Troponin i, poc 0.09 (*)    All other components within normal limits  I-STAT CHEM 8, ED - Abnormal; Notable for the following components:   BUN 104 (*)    Creatinine, Ser 4.10 (*)    Hemoglobin 11.6 (*)    HCT 34.0 (*)    All other components within normal limits  TSH   ____________________________________________  EKG   EKG Interpretation  Date/Time:  Thursday November 26 2017 13:20:41 EDT Ventricular Rate:  67 PR Interval:    QRS Duration: 137 QT Interval:  428 QTC Calculation: 367 R Axis:   -19 Text Interpretation:  Sinus rhythm Ventricular bigeminy IVCD, consider atypical RBBB ST depression, consider ischemia, lateral lds No STEMI.  Confirmed by Nanda Quinton [phone]) on 11/26/2017 1:58:01 PM       ____________________________________________  RADIOLOGY  Dg Chest Portable 1 View  Result Date: 11/26/2017 CLINICAL DATA:  Fall. EXAM: PORTABLE CHEST 1 VIEW COMPARISON:  11/03/2017. FINDINGS: Prior CABG. Cardiomegaly with pulmonary venous congestion, bilateral interstitial prominence and right-sided pleural effusion. Findings consistent with CHF. Findings have progressed from prior study of 11/03/2017. IMPRESSION: Prior CABG. Cardiomegaly with bilateral pulmonary infiltrates/edema and right-sided pleural effusion. Findings consistent with CHF. Findings have progressed from prior study of 11/03/2017. Electronically Signed   By: Marcello Moores  Register   On: 11/26/2017 13:55    ____________________________________________   PROCEDURES  Procedure(s) performed:    Procedures  CRITICAL CARE Performed by: Margette Fast Total critical care time: 35 minutes Critical care time was exclusive of separately billable procedures and treating other patients. Critical care was necessary to treat or prevent imminent or life-threatening deterioration. Critical care was time spent personally by me on the following activities: development of treatment plan with patient and/or surrogate as well as nursing, discussions with consultants, evaluation of patient's response to treatment, examination of patient, obtaining history from patient or surrogate, ordering and performing treatments and  interventions, ordering and review of laboratory studies, ordering and review of radiographic studies, pulse oximetry and re-evaluation of patient's condition.  Nanda Quinton, MD Emergency Medicine  ____________________________________________   INITIAL IMPRESSION / ASSESSMENT AND PLAN / ED COURSE  Pertinent labs & imaging results that were available during my care of the patient were reviewed by me and considered in my medical decision making (see chart for details).  Patient presents to the emergency department with severe bradycardia.  The rhythm is sinus with circular bigeminy.  Patient's blood pressure is normal.  Suspect possible electrolyte abnormality causing the arrhythmia as the patient is with known CKD.  Her blood pressure is normal and she is awake and able to provide a history.  She does not require external pacing at this time.  We will continue to monitor closely.  I did speak with cardiology initially regarding the patient's heart rate.  They will consult.   02:25 PM Spoke with Dr. Lovena Le with Cardiology. He reviewed the EKG. Recommends magnesium replacement and obs overnight for troponin trending but suspect this is 2/2 CKD. Will discuss with Hospitalist. Patient with edema on CXR. Will try lasix here as well. TSH pending.   02:44 PM Spoke with Nephrology on call  regarding pulmonary edema. Agrees with trial of lasix to try and resolve. If symptoms worsen would consider starting HD and could consult at that time.   Discussed patient's case with Hospitalist to request admission. Patient and family (if present) updated with plan. Care transferred to Hospitalist service.  I reviewed all nursing notes, vitals, pertinent old records, EKGs, labs, imaging (as available).  ____________________________________________  FINAL CLINICAL IMPRESSION(S) / ED DIAGNOSES  Final diagnoses:  Bradycardia  Generalized weakness  Acute pulmonary edema (HCC)     MEDICATIONS GIVEN DURING THIS VISIT:  Medications  magnesium sulfate IVPB 1 g 100 mL (has no administration in time range)  furosemide (LASIX) injection 60 mg (has no administration in time range)    Note:  This document was prepared using Dragon voice recognition software and may include unintentional dictation errors.  Nanda Quinton, MD Emergency Medicine   Mikeila Burgen, [facility], MD 11/26/17 [phone]

## 2017-11-26 NOTE — Progress Notes (Signed)
Assessment and Plan:  [name] was seen today for follow-up and medicare wellness.  Diagnoses and all orders for this visit:  Acute on chronic heart failure, unspecified heart failure type ([place])  Chronic kidney disease (CKD), active medical management without dialysis, stage 5 ([place])  Labs and OV deferred; EMS called for transportation for [name] ED for STAT labs, possible need for bridging dialysis therapy until AV fistula matures ~10 weeks. ER charge nurse was contacted and notified of impending arrival prior to patient departing the premises.   Further disposition pending results of labs. Discussed med's effects and SE's.   Over 30 minutes of exam, counseling, chart review, and critical decision making was performed.   Future Appointments  Date Time Provider [place]  12/24/2017  3:00 PM [facility] HS VASC 6 - PS [facility] VVS  12/24/2017  4:00 PM [facility] PA [facility]  01/18/2018 11:30 AM Burtis Junes, NP [facility]  02/10/2018 11:00 AM [facility] LAB 1 [facility] None  02/10/2018 11:30 AM Magrinat, Virgie Dad, MD [facility] None  03/02/2018 11:00 AM Unk Pinto, MD [facility] None  09/07/2018  3:00 PM Unk Pinto, MD [facility] None    ------------------------------------------------------------------------------------------------------------------   HPI BP 138/70   Pulse 60   Temp (!) 96.8 F (36 C)   SpO2 93%   72 y.o.female presents for 3 m f/u for chronic disease management and medicare wellness, but reports feeling unwell; she has CHF, T2DM and ESRD, recently on 11/11/2017 had AV fistula placed to right upper extremity by Dr. Curt Jews, followed by [facility] Madelon Lips at [facility]. She reports she has not followed up since surgery, and nephrologist is out of town. Today she presents appearing pale/unwell, reports feeling increasingly nauseous and poorly with increasing edema, abdominal distention, dyspnea, fatigue, feeling very  short of breath when lying down.   O2 % 93-94 by provider check, patient could not tolerate standing for weight check  Discussed with patient concern for further decompensation of renal disease, signs of acute heart failure. Discussed need for STAT labs, and limited options for fluid management on outpatient basis; She and her husband would prefer to present to ER, with possible need for bridging alternate dialysis therapy.    Past Medical History:  Diagnosis Date  . Allergy   . Arthritis    "maybe in my lower back" (08/27/2012)  . 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 Chronic combined systolic and diastolic CHF (congestive heart failure) ([place])   . CKD (chronic kidney disease), stage IV ([place])   . Coronary artery disease    a. NSTEMI in 05/2009 s/p CABG (LIMA-LAD, SVG-diagonal, SVG-OM1/OM 2).   Marland Kitchen Dyspnea    with exertion  . Family history of anesthesia complication    Mom has a hard time to wake up  . Foot ulcer ([place])    "I've had them on both feet" (08/27/2012)  . Gouty arthritis    "right index finger" (08/27/2012)  . History of radiation therapy 06/09/13-07/06/13   left breast 50Gy  . Hyperlipidemia   . Hypertension   . Infection    right second toe  . Ischemic cardiomyopathy    a. 2011: EF 40-45%. b. EF 55-60% in 04/2017 but shortly after as inpatient was 45-50%.  Marland Kitchen LBBB (left bundle branch block)   . Mitral regurgitation   . Neuropathy    Hx; of B/L feet  . NSTEMI (non-ST elevated myocardial infarction) ([place]) 05/23/2009  . Osteomyelitis of  foot ([place])   . PAD (peripheral artery disease) (HCC)    a. LE PAD (patient previously elected hold off L fem-pop), prev followed by Dr. Bridgett Larsson  . PONV (postoperative nausea and vomiting)   . Sinus headache    "occasionally" (08/27/2012)  . Type II diabetes mellitus ([place])   . Vitamin D deficiency      Allergies  Allergen Reactions  . Atenolol Other (See Comments)    Exacerbates gout  .  Atenolol Other (See Comments)    Connection to gout ?  Marland Kitchen Contrast Media [Iodinated Diagnostic Agents] Rash  . Latex Rash  . Latex Rash  . Omnipaque [Iohexol] Rash    Current Outpatient Medications on File Prior to Visit  Medication Sig  . acetaminophen (TYLENOL) 500 MG tablet Take 1,000 mg by mouth every 6 (six) hours as needed.  Marland Kitchen allopurinol (ZYLOPRIM) 100 MG tablet Take 100 mg by mouth 2 (two) times daily.  Marland Kitchen anastrozole (ARIMIDEX) 1 MG tablet Take 1 tablet (1 mg total) by mouth daily.  Marland Kitchen aspirin EC 81 MG tablet Take 1 tablet (81 mg total) by mouth daily.  Marland Kitchen aspirin EC 81 MG tablet Take 81 mg by mouth daily.  Marland Kitchen aspirin-acetaminophen-caffeine (EXCEDRIN MIGRAINE) 250-250-65 MG tablet Take 1 tablet by mouth daily as needed (for sinus headaches.).  Marland Kitchen atorvastatin (LIPITOR) 80 MG tablet Take 80 mg by mouth daily.  . beta carotene w/minerals (OCUVITE) tablet Take 1 tablet by mouth daily.  . bumetanide (BUMEX) 2 MG tablet Take 1 tablet (2 mg total) by mouth 2 (two) times daily.  . carboxymethylcellulose (REFRESH PLUS) 0.5 % SOLN Place 1 drop into both eyes daily as needed (dry eyes).  . carvedilol (COREG) 25 MG tablet TAKE 1 TABLET TWICE DAILY WITH MEALS  . Cholecalciferol (VITAMIN D3) 2000 units TABS Take 2,000-4,000 Units by mouth daily.   . clindamycin (CLEOCIN) 300 MG capsule Take 1 capsule (300 mg total) by mouth 3 (three) times daily.  Marland Kitchen docusate sodium (COLACE) 100 MG capsule Take 100 mg by mouth at bedtime.  . fluticasone (FLONASE) 50 MCG/ACT nasal spray Place 2 sprays into both nostrils daily as needed for allergies or rhinitis.  . furosemide (LASIX) 40 MG tablet Take 40 mg by mouth 2 (two) times daily. 2 tablets (80 mg ) am 1-2 tablets pm  . hydrALAZINE (APRESOLINE) 25 MG tablet TAKE 3 TABLETS THREE TIMES DAILY  . insulin aspart (NOVOLOG) 100 UNIT/ML FlexPen Inject 3 Units into the skin daily. With your largest meal of the day  . insulin glargine (LANTUS) 100 UNIT/ML injection  Inject 0.05 mLs (5 Units total) into the skin daily.  . insulin NPH-regular Human (NOVOLIN 70/30) (70-30) 100 UNIT/ML injection Inject 30-65 Units into the skin See admin instructions. 60-65 units into the skin in the morning before breakfast & 30-35 units in the evening, depending on BGL  . isosorbide dinitrate (ISORDIL) 10 MG tablet Take 10 mg by mouth 3 (three) times daily.  . Multiple Vitamins-Minerals (OCUVITE EYE HEALTH FORMULA PO) Take 1 tablet by mouth daily.  . NON FORMULARY [employer] Tonic and Homeopathic Remedy: Mix 1 ounce with water and drink one to two times a day (5-6 times a week)  . oxyCODONE (ROXICODONE) 5 MG immediate release tablet Take 1 tablet (5 mg total) by mouth every 6 (six) hours as needed for severe pain.  Vladimir Faster Glycol-Propyl Glycol (LUBRICANT EYE DROPS) 0.4-0.3 % SOLN Place 1-2 drops into both eyes 3 (three) times daily as needed (  for dry eyes.).  Marland Kitchen sodium chloride (OCEAN) 0.65 % SOLN nasal spray Place 1 spray into both nostrils as needed for congestion.  . Vitamin D, Ergocalciferol, 2000 units CAPS Take 2,000 Units by mouth daily.  . Wound Dressings (MEDIHONEY WOUND/BURN DRESSING) GEL Apply to affected are 3 times a week, and cover with sterile dressing.  Marland Kitchen allopurinol (ZYLOPRIM) 100 MG tablet Take 100 mg by mouth 2 (two) times daily.  Marland Kitchen anastrozole (ARIMIDEX) 1 MG tablet Take 1 mg by mouth daily.  Marland Kitchen atorvastatin (LIPITOR) 80 MG tablet TAKE 1 TABLET DAILY AT 6 PM  . carvedilol (COREG) 25 MG tablet Take 25 mg by mouth 2 (two) times daily.  . hydrALAZINE (APRESOLINE) 25 MG tablet Take 75 mg by mouth 3 (three) times daily.  . hydrALAZINE (APRESOLINE) 25 MG tablet TAKE 3 TABLETS THREE TIMES DAILY  . isosorbide dinitrate (ISORDIL) 10 MG tablet TAKE 1 TABLET THREE TIMES DAILY   No current facility-administered medications on file prior to visit.     ROS: all negative except above.   Physical Exam:  BP 138/70   Pulse 60   Temp (!) 96.8 F (36 C)    SpO2 93%   General Appearance: Appears very pale, unwell/ill, in moderate distress. Eyes: PERRLA, conjunctiva no swelling or erythema ENT/Mouth:  Hearing normal.  Neck: Supple  Respiratory: Respiratory effort mildly increased, BS quiet bilaterally, ? Rales in bases without rhonchi, wheezing or stridor.  Cardio: RRR with no MRGs. Severe pitting edema bilateral ankles/ lower legs Abdomen: Distended, somewhat firm, + BS.  Non tender, no guarding, rebound, palpable hernias, masses. Lymphatics:  Musculoskeletal: Generalized weakness, wheelchair bound, can stand with assistance only Skin: Warm, dry; pale/ashen Neuro: Cranial nerves intact. Normal muscle tone Psych: Awake and oriented X 3, depressed affect, Insight and Judgment appropriate.     [name], NP 12:58 PM [facility]

## 2017-11-26 NOTE — H&P (Addendum)
[facility] History and Physical  Pamela SWALLEY DQQ:[id number] DOB: 1945-08-22 DOA: 11/26/2017  Referring physician:   PCP: Unk Pinto, MD   Chief Complaint:  Bradycardia and shortness of breath  HPI:  72 year old female with a history of CAD (NSTEMI 2011 s/p CABGper Dr. Cyndia Bent), chronic combined CHF/ICM, CKD stage IV ((baseline Cr around 2.7-3.1), HTN, HLD, DM, LE PAD (patient previously elected hold off L fem-pop), foot ulcer/infection, osteomyelitis of the left toe, LBBB, arthritis, breast CA s/p lumpectomy/radiation,&gout.Repeatecho 05/04/17 showed mild LVH, EF 45-50%, diffuse HK, grade 2 DD, calcified mitral annulus with moderate MR, mildly dilated LA, normal RV, followed by cardiology last seen 10/07/17. Status post  Right radiocephalic AV fistula on 04/16/39.patient was given 6 tablets of oxycodone. Patient states that she used 4 tablets postoperatively and 2  over the course of the last few days. She has not been using oxycodone otherwise and has been taking only Tylenol. Patient brought into the ER today because of worsening bradycardia and the setting of worsening renal function.  Patient has also been sleeping a lot more lately. She has had worsening dyspnea on exertion and dependent edema in bilateral lower extremities over the course of the last 2-3 weeks. She denies any fever chills rigorsBrought in via EMS because of bradycardia with heart rate in the 30s. Patient received 0.5 mg of atropine enroute. Initial EKG showed bigeminy with palpebral heart rate of 30 . EKG showed Sinus rhythm Ventricular bigeminy IVCD, consider atypical RBBB ST depression. Chest x-ray showed cardiomegaly with bilateral pulmonary infiltrates/edema. EDP discussed with Dr. Lovena Le recommended observation overnight and trend troponins for patients bradycardia and shortness of breath.  Patient is being admitted for shortness of breath in the setting of worsening renal failure, acute on chronic systolic  heart failure, bradycardia     Review of Systems: negative for the following   complete review of systems was done with pertinent positives as documented in history of present illness     Past Medical History:  Diagnosis Date  . Allergy   . Arthritis    "maybe in my lower back" (08/27/2012)  . 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 Chronic combined systolic and diastolic CHF (congestive heart failure) ([place])   . CKD (chronic kidney disease), stage IV ([place])   . Coronary artery disease    a. NSTEMI in 05/2009 s/p CABG (LIMA-LAD, SVG-diagonal, SVG-OM1/OM 2).   Marland Kitchen Dyspnea    with exertion  . Family history of anesthesia complication    Mom has a hard time to wake up  . Foot ulcer ([place])    "I've had them on both feet" (08/27/2012)  . Gouty arthritis    "right index finger" (08/27/2012)  . History of radiation therapy 06/09/13-07/06/13   left breast 50Gy  . Hyperlipidemia   . Hypertension   . Infection    right second toe  . Ischemic cardiomyopathy    a. 2011: EF 40-45%. b. EF 55-60% in 04/2017 but shortly after as inpatient was 45-50%.  Marland Kitchen LBBB (left bundle branch block)   . Mitral regurgitation   . Neuropathy    Hx; of B/L feet  . NSTEMI (non-ST elevated myocardial infarction) ([place]) 05/23/2009  . Osteomyelitis of foot ([place])   . PAD (peripheral artery disease) ([facility])    a. LE PAD (patient previously elected hold off L fem-pop), prev followed by Dr. Bridgett Larsson  . PONV (postoperative nausea and vomiting)   . Sinus headache    "  occasionally" (08/27/2012)  . Type II diabetes mellitus ([place])   . Vitamin D deficiency      Past Surgical History:  Procedure Laterality Date  . AMPUTATION Right 08/27/2012   Procedure: AMPUTATION RAY;  Surgeon: Newt Minion, MD;  Location: [facility];  Service: Orthopedics;  Laterality: Right;  Right Foot 5th Ray Amputation  . AMPUTATION Left 07/08/2013   Procedure: AMPUTATION RAY;  Surgeon: Newt Minion, MD;   Location: [facility];  Service: Orthopedics;  Laterality: Left;  Left Foot 2nd Ray Amputation  . AMPUTATION RAY Right 08/27/2012   5th ray/notes 08/27/2012  . AMPUTATION TOE Right 03/06/2017   Procedure: AMPUTATION TOE, INTERPHANGEAL 2ND RIGHT;  Surgeon: [name], DPM;  Location: [facility];  Service: Podiatry;  Laterality: Right;  . AV FISTULA PLACEMENT Right 11/11/2017   Procedure: RIGHT RADIOCEPHALIC  ARTERIOVENOUS FISTULA;  Surgeon: [name], MD;  Location: [facility];  Service: Vascular;  Laterality: Right;  . BREAST LUMPECTOMY  04/20/2013   with biopsy      DR WAKEFIELD  . BREAST LUMPECTOMY WITH NEEDLE LOCALIZATION AND AXILLARY SENTINEL LYMPH NODE BX Left 04/20/2013   Procedure: LEFT BREAST WIRE GUIDED LUMPECTOMY AND AXILLARY SENTINEL LYMPH NODE BX;  Surgeon: Rolm Bookbinder, MD;  Location: [facility];  Service: General;  Laterality: Left;  . CARDIAC CATHETERIZATION  05/24/2009   Archie Endo 05/24/2009 (08/27/2012)  . CATARACT EXTRACTION W/ INTRAOCULAR LENS  IMPLANT, BILATERAL  2000's  . COLONOSCOPY W/ BIOPSIES AND POLYPECTOMY  2010  . CORONARY ARTERY BYPASS GRAFT  2011   "CABG X4" (08/27/2012)  . DENTAL SURGERY  04/30/11   "1 implant" (08/27/2012)  . DILATION AND CURETTAGE OF UTERUS    . EYE SURGERY    . FINGER SURGERY Right    "index finger; turned out to be gout" (08/27/2012)      Social History:  reports that she has quit smoking. She has never used smokeless tobacco. She reports that she drinks alcohol. She reports that she does not use drugs.    Allergies  Allergen Reactions  . Atenolol Other (See Comments)    Exacerbates gout  . Adhesive [Tape] Other (See Comments)    SKIN IS VERY SENSITIVE AND BRUISES AND TEARS EASILY; PLEASE USE AN ALTERNATIVE TO TAPE!!  . Atenolol Other (See Comments)    Connection to gout ?  Marland Kitchen Contrast Media [Iodinated Diagnostic Agents] Rash  . Latex Rash  . Latex Rash  . Omnipaque [Iohexol] Rash    Family History  Problem Relation Age of Onset  . Kidney  cancer Brother 8  . Hypertension Brother   . Breast cancer Sister 31       LCIS; BRCA negative  . Throat cancer Father 70       smoker  . Breast cancer Sister 34       Lobular breast cancer  . Breast cancer Maternal Aunt        dx in her 65s        Prior to Admission medications   Medication Sig Start Date End Date Taking? Authorizing Provider  acetaminophen (TYLENOL) 500 MG tablet Take 1,000 mg by mouth every 6 (six) hours as needed for mild pain or headache.    Yes [provider]  sodium chloride (OCEAN) 0.65 % SOLN nasal spray Place 1 spray into both nostrils as needed for congestion.   Yes [provider]  allopurinol (ZYLOPRIM) 100 MG tablet Take 100 mg by mouth 2 (two) times daily.  [provider]  allopurinol (ZYLOPRIM) 100 MG tablet Take 100 mg by mouth 2 (two) times daily.    [provider]  anastrozole (ARIMIDEX) 1 MG tablet Take 1 tablet (1 mg total) by mouth daily. 02/02/17   Magrinat, Virgie Dad, MD  aspirin EC 81 MG tablet Take 1 tablet (81 mg total) by mouth daily. Patient taking differently: Take 81 mg by mouth every evening.  05/18/17   Burtis Junes, NP  aspirin-acetaminophen-caffeine (EXCEDRIN MIGRAINE) [phone] MG tablet Take 1 tablet by mouth daily as needed (for sinus headaches.).    [provider]  atorvastatin (LIPITOR) 80 MG tablet TAKE 1 TABLET DAILY AT 6 PM Patient taking differently: Take 80 mg by mouth daily at 6 PM.  10/26/17   Liane Comber, NP  atorvastatin (LIPITOR) 80 MG tablet Take 80 mg by mouth daily. 10/27/17   [provider]  beta carotene w/minerals (OCUVITE) tablet Take 1 tablet by mouth daily.    [provider]  bumetanide (BUMEX) 2 MG tablet Take 1 tablet (2 mg total) by mouth 2 (two) times daily. 11/05/17   Isabelle Course, MD  carboxymethylcellulose (REFRESH PLUS) 0.5 % SOLN Place 1 drop into both eyes daily as needed (dry eyes).    [provider]  carvedilol  (COREG) 25 MG tablet TAKE 1 TABLET TWICE DAILY WITH MEALS 10/26/17   Liane Comber, NP  carvedilol (COREG) 25 MG tablet Take 25 mg by mouth 2 (two) times daily. 10/27/17   [provider]  Cholecalciferol (VITAMIN D3) 2000 units TABS Take 2,000-4,000 Units by mouth daily.     [provider]  clindamycin (CLEOCIN) 300 MG capsule Take 1 capsule (300 mg total) by mouth 3 (three) times daily. 09/28/17   [name], DPM  docusate sodium (COLACE) 100 MG capsule Take 100 mg by mouth at bedtime.    [provider]  fluticasone (FLONASE) 50 MCG/ACT nasal spray Place 2 sprays into both nostrils daily as needed for allergies or rhinitis.    [provider]  furosemide (LASIX) 40 MG tablet Take 40 mg by mouth 2 (two) times daily. 2 tablets (80 mg ) am 1-2 tablets pm    [provider]  hydrALAZINE (APRESOLINE) 25 MG tablet TAKE 3 TABLETS THREE TIMES DAILY Patient taking differently: Take 25 mg by mouth 3 (three) times daily.  09/14/17   Unk Pinto, MD  hydrALAZINE (APRESOLINE) 25 MG tablet TAKE 3 TABLETS THREE TIMES DAILY Patient taking differently: Take by mouth 3 (three) times daily.  11/18/17   Unk Pinto, MD  insulin aspart (NOVOLOG) 100 UNIT/ML FlexPen Inject 3 Units into the skin daily. With your largest meal of the day 11/05/17   Isabelle Course, MD  insulin glargine (LANTUS) 100 UNIT/ML injection Inject 0.05 mLs (5 Units total) into the skin daily. 11/06/17   Isabelle Course, MD  insulin NPH-regular Human (NOVOLIN 70/30) (70-30) 100 UNIT/ML injection Inject 30-65 Units into the skin See admin instructions. 60-65 units into the skin in the morning before breakfast & 30-35 units in the evening, depending on BGL    [provider]  isosorbide dinitrate (ISORDIL) 10 MG tablet TAKE 1 TABLET THREE TIMES DAILY 10/26/17   Liane Comber, NP  Multiple Vitamins-Minerals (OCUVITE EYE HEALTH FORMULA PO) Take 1 tablet by mouth daily.    [provider]  NON FORMULARY [place] Tonic and Homeopathic Remedy: Mix 1 ounce with water and drink one to two times a  day (5-6 times a week)    [provider]  oxyCODONE (ROXICODONE) 5 MG immediate release tablet Take 1 tablet (5 mg total) by mouth every 6 (six) hours as needed for severe pain. 11/11/17   Rhyne, Hulen Shouts, PA-C  Polyethyl Glycol-Propyl Glycol (LUBRICANT EYE DROPS) 0.4-0.3 % SOLN Place 1-2 drops into both eyes 3 (three) times daily as needed (for dry eyes.).    [provider]  Vitamin D, Ergocalciferol, 2000 units CAPS Take 2,000 Units by mouth daily.    [provider]  Wound Dressings (MEDIHONEY WOUND/BURN DRESSING) GEL Apply to affected are 3 times a week, and cover with sterile dressing. Patient not taking: Reported on 11/26/2017 09/29/17   [name], DPM     Physical Exam: Vitals:   11/26/17 1321  Weight: 81.5 kg  Height: _0  (1.651 m)        Vitals:   11/26/17 1321  Weight: 81.5 kg  Height: _1  (1.651 m)   Constitutional:  Somnolent but arousable and conversive Eyes: PERRL, lids and conjunctivae normal ENMT: Mucous membranes are moist. Posterior pharynx clear of any exudate or lesions.Normal dentition.  Neck: normal, supple, no masses, no thyromegaly Respiratory: clear to auscultation bilaterally, no wheezing, no crackles. Normal respiratory effort. No accessory muscle use.  Cardiovascular: Regular rate and rhythm, no murmurs / rubs / gallops. No extremity edema. 2+ pedal pulses. No carotid bruits.  Abdomen: no tenderness, no masses palpated. No hepatosplenomegaly. Bowel sounds positive.  Musculoskeletal: no clubbing / cyanosis. No joint deformity upper and lower extremities. Good ROM, no contractures. Normal muscle tone.  Skin: no rashes, lesions, ulcers. No induration Neurologic: CN 2-12 grossly intact. Sensation intact, DTR normal. Strength 5/5 in all 4.  Psychiatric: Normal judgment and insight. Alert and  oriented x 3. Normal mood.     Labs on Admission: I have personally reviewed following labs and imaging studies  CBC: Recent Labs  Lab 11/26/17 1331 11/26/17 1352  WBC 7.8  --   NEUTROABS 6.0  --   HGB 10.4* 11.6*  HCT 33.8* 34.0*  MCV 88.3  --   PLT 180  --     Basic Metabolic Panel: Recent Labs  Lab 11/26/17 1331 11/26/17 1352  NA 140 137  K 4.2 4.0  CL 107 104  CO2 22  --   GLUCOSE 98 89  BUN 104* 104*  CREATININE 3.99* 4.10*  CALCIUM 8.7*  --   MG 2.7*  --     GFR: Estimated Creatinine Clearance: 13.3 mL/min (A) (by C-G formula based on SCr of 4.1 mg/dL (H)).  Liver Function Tests: Recent Labs  Lab 11/26/17 1331  AST 34  ALT 40  ALKPHOS 107  BILITOT 0.8  PROT 5.3*  ALBUMIN 3.0*   No results for input(s): LIPASE, AMYLASE in the last 168 hours. No results for input(s): AMMONIA in the last 168 hours.  Coagulation Profile: Recent Labs  Lab 11/26/17 1331  INR 1.24   No results for input(s): DDIMER in the last 72 hours.  Cardiac Enzymes: No results for input(s): CKTOTAL, CKMB, CKMBINDEX, TROPONINI in the last 168 hours.  BNP (last 3 results) No results for input(s): PROBNP in the last 8760 hours.  HbA1C: No results for input(s): HGBA1C in the last 72 hours. Lab Results  Component Value Date   HGBA1C 7.1 (H) 08/04/2017   HGBA1C 7.0 (H) 04/28/2017   HGBA1C 10.2 (H) 10/13/2016     CBG: Recent Labs  Lab 11/26/17 [place]  111*    Lipid Profile: No results for input(s): CHOL, HDL, LDLCALC, TRIG, CHOLHDL, LDLDIRECT in the last 72 hours.  Thyroid Function Tests: Recent Labs    11/26/17 1331  TSH 3.201    Anemia Panel: No results for input(s): VITAMINB12, FOLATE, FERRITIN, TIBC, IRON, RETICCTPCT in the last 72 hours.  Urine analysis:    Component Value Date/Time   COLORURINE YELLOW 11/03/2017 1350   APPEARANCEUR CLEAR 11/03/2017 1350   LABSPEC 1.020 11/03/2017 1350   PHURINE 5.0 11/03/2017 1350   GLUCOSEU 100 (A)  11/03/2017 1350   HGBUR NEGATIVE 11/03/2017 1350   BILIRUBINUR NEGATIVE 11/03/2017 1350   KETONESUR NEGATIVE 11/03/2017 1350   PROTEINUR 100 (A) 11/03/2017 1350   UROBILINOGEN 0.2 05/30/2009 1500   NITRITE NEGATIVE 11/03/2017 1350   LEUKOCYTESUR NEGATIVE 11/03/2017 1350    Sepsis Labs: '@LABRCNTIP'$ (procalcitonin:4,lacticidven:4) )No results found for this or any previous visit (from the past 240 hour(s)).       Radiological Exams on Admission: Dg Chest Portable 1 View  Result Date: 11/26/2017 CLINICAL DATA:  Fall. EXAM: PORTABLE CHEST 1 VIEW COMPARISON:  11/03/2017. FINDINGS: Prior CABG. Cardiomegaly with pulmonary venous congestion, bilateral interstitial prominence and right-sided pleural effusion. Findings consistent with CHF. Findings have progressed from prior study of 11/03/2017. IMPRESSION: Prior CABG. Cardiomegaly with bilateral pulmonary infiltrates/edema and right-sided pleural effusion. Findings consistent with CHF. Findings have progressed from prior study of 11/03/2017. Electronically Signed   By: Marcello Moores  Register   On: 11/26/2017 13:55   Dg Chest 2 View  Result Date: 11/03/2017 CLINICAL DATA:  Follow-up infiltrate EXAM: CHEST - 2 VIEW COMPARISON:  Earlier study of 11/03/2017 FINDINGS: Enlargement of cardiac silhouette post CABG. Atherosclerotic calcification aorta. Mediastinal contours normal. Slight pulmonary vascular congestion. Improved interstitial edema since the earlier study. Persistent RIGHT pleural effusion and basilar atelectasis. No pneumothorax. Bones demineralized. IMPRESSION: Enlargement of cardiac silhouette post CABG with pulmonary vascular congestion. Slightly improved pulmonary edema. Persistent RIGHT pleural effusion and basilar atelectasis. Electronically Signed   By: Lavonia Dana M.D.   On: 11/03/2017 18:52   Ct Head Wo Contrast  Result Date: 11/03/2017 CLINICAL DATA:  Fall.  Altered mental status. EXAM: CT HEAD WITHOUT CONTRAST TECHNIQUE: Contiguous axial  images were obtained from the base of the skull through the vertex without intravenous contrast. COMPARISON:  None. FINDINGS: Brain: There is no evidence of acute infarct, intracranial hemorrhage, mass, midline shift, or extra-axial fluid collection. There is a small left cerebellar infarct with volume loss compatible with a chronic infarct. Bilateral basal ganglia lacunar infarcts are also most likely chronic. Patchy periventricular white matter hypodensities are nonspecific but compatible with mild chronic small vessel ischemic disease. Vascular: Calcified atherosclerosis at the skull base. No hyperdense vessel. Skull: No fracture or suspicious osseous lesion. Moderate-sized right frontal scalp hematoma with underlying prominent vascular channel in the frontal bone. Sinuses/Orbits: Chronic sphenoid sinusitis with sinus expansion, wall thickening and sclerosis, and complex and partially calcified central material in the sinus. Milder mucosal thickening elsewhere in the paranasal sinuses. Clear mastoid air cells. Bilateral cataract extraction. Other: None. IMPRESSION: 1. No evidence of acute intracranial abnormality. 2. Right frontal scalp hematoma.  No skull fracture. 3. Small basal ganglia and cerebellar infarcts, likely chronic. 4. Mild chronic small vessel ischemic white matter disease. 5. Chronic sphenoid sinusitis. These results were called by telephone at the time of interpretation on 11/03/2017 at 9:16 am to Dr. Marda Stalker , who verbally acknowledged these results. Electronically Signed   By: Seymour Bars.D.  On: 11/03/2017 09:29   Dg Chest Portable 1 View  Result Date: 11/26/2017 CLINICAL DATA:  Fall. EXAM: PORTABLE CHEST 1 VIEW COMPARISON:  11/03/2017. FINDINGS: Prior CABG. Cardiomegaly with pulmonary venous congestion, bilateral interstitial prominence and right-sided pleural effusion. Findings consistent with CHF. Findings have progressed from prior study of 11/03/2017. IMPRESSION: Prior  CABG. Cardiomegaly with bilateral pulmonary infiltrates/edema and right-sided pleural effusion. Findings consistent with CHF. Findings have progressed from prior study of 11/03/2017. Electronically Signed   By: Marcello Moores  Register   On: 11/26/2017 13:55   Dg Chest Portable 1 View  Result Date: 11/03/2017 CLINICAL DATA:  Shortness of breath and recent fall, initial encounter EXAM: PORTABLE CHEST 1 VIEW COMPARISON:  None. FINDINGS: Cardiac shadow is enlarged. Aortic calcifications are again seen. The lungs are well aerated bilaterally with patchy infiltrate in the right lung base. No sizable effusion is noted. No bony abnormality is seen. IMPRESSION: Right basilar infiltrate. Followup PA and lateral chest X-ray is recommended in 3-4 weeks following trial of antibiotic therapy to ensure resolution and exclude underlying malignancy. Electronically Signed   By: Inez Catalina M.D.   On: 11/03/2017 08:48      EKG: Independently reviewed.  Sinus bradycardia  Assessment/Plan Acute on chronic combined systolic/diastolic heart failure Last known EF of 45-50% on 05/04/17 Patient started on diuresis with IV Lasix Chest x-ray consistent with pulmonary edema Admit to telemetry, continue to cycle cardiac enzymes Strict I's and O's BNP 2382 Patient states that she has been taking Bumex at home which does not seem to be helping Cardiology following  Bradycardia Suspect this is related to Coreg 25 mg twice a day/oxycodone in the setting of worsening renal function Hold beta blocker and monitor  Acute on chronic renal disease Normally she is followed by Dr.Upton Baseline creatinine around 3.16, creatinine currently 4.1, BUN 104 Nephrology was notified by EDP, and recommendation was made to call them back if the patient does not respond to IV diuretics Will get ABG , suspect somnolence is secondary to azotemia Will request nephrology to evaluate    Hyperlipidemia-continue statin    Essential hypertension  -we'll continue low-dose hydralazine and Isordil , hold Coreg     Type II diabetes mellitus with neurological manifestations, uncontrolled ([place])   Continue low-dose Lantus will start patient on Accu-Cheks , SSI Check hemoglobin A1c     Altered mental status Husband states that she has been excessively somnolent Suspect this is secondary to  Her elevated BUN and azotemia check UA to rule out UTI     DVT prophylaxis:  heparin     Code Status Orders full code  (From admission, onward)      consults called: cardiology  Family Communication: Admission, patients condition and plan of care including tests being ordered have been discussed with the patient  who indicates understanding and agree with the plan and Code Status   Admission status:  observation      Disposition plan: Further plan will depend as patient's clinical course evolves and further radiologic and laboratory data become available. Likely home when stable        Reyne Dumas MD [facility] Pager [phone]  If 7PM-7AM, please contact night-coverage [email or web address] Password [facility]  11/26/2017, 3:39 PM

## 2017-11-26 NOTE — Progress Notes (Signed)
Pt received, pt lethargic and c/o nausea, zofran iv given, vss, unabel to get temp ready, bp stable ra sat 90 then as pt awoke it was up to 95% on ra, dinamap  Heartrate rang alarm 20-30 with pleth line readingvery slow, telemetry applied and tech reports hr 50's with pvcs, pt reports just feeling bad, blood sugar check 35, paged Dr Allyson Sabal with order to give d10, narcan and she will consult nephrologist, new av fistula  With    + bruit and thrill, rapid    Response nurse and charge nurse at bedside

## 2017-11-26 NOTE — Consult Note (Signed)
Reason for Consult: AKI/CKD stage 5 Referring Physician:  Allyson Sabal, MD  [name] is an 72 y.o. female.  HPI: [name] is a 72 yo WF with an extensive PMH significant for CAD s/p CABG, chronic combined CHF/ICM (EF of 44% with global hypokinesis and grade 2 diastolic dysfunction), HTN, poorly controlled DM, PAD, h/o breast cancer s/p lumpectomy/radiation, and CKD stage 5 (due to DM/HTN, and cardiorenal syndrome) who presented to [facility] ED with 1 week history of worsening edema, fatigue, and SOB.  She was recently discharged from [facility] on 11/05/17 following a fall due to hypoglycemia and CHF exacerbation.  She was switched to bumex at time of discharge and reported initial improvement of edema, however it has worsened over the last week and was worse today.  She was found to have bradycardia with an HR in the 30's and given atropine enroute by EMS.  Her CXR revealed cardiomegaly and bilateral pulmonary edema and was admitted for further evaluation.  We were consulted due to the rise in her BUN/Cr since her discharge on 11/05/17.  The trend in Scr is seen below.    Of note, she did undergo right radiocephalic AVF on 0/1/74 by Dr. Donnetta Hutching.  She reports a funny metallic taste since starting bumex and has also had nausea, increasing abdominal girth, and lower extremity edema as above as well as orthopnea and SOB.  She became unresponsive when brought to her room and rapid response was called.  She was hypoxic at 90% on RA and her glucose dropped to 35.  She was given an amp of D50 and is more awake and alert, although still fatigued.    Trend in Creatinine: Creatinine, Ser  Date/Time Value Ref Range Status  11/26/2017 01:52 PM 4.10 (H) 0.44 - 1.00 mg/dL Final  11/26/2017 01:31 PM 3.99 (H) 0.44 - 1.00 mg/dL Final  11/05/2017 03:00 AM 3.18 (H) 0.44 - 1.00 mg/dL Final  11/04/2017 05:11 AM 3.14 (H) 0.44 - 1.00 mg/dL Final  11/03/2017 08:28 AM 3.16 (H) 0.44 - 1.00 mg/dL Final  08/04/2017 05:38 PM 4.37 (H) 0.60 -  0.93 mg/dL Final  05/18/2017 12:18 PM 3.36 (H) 0.57 - 1.00 mg/dL Final  05/11/2017 10:38 AM 3.71 (H) 0.60 - 0.93 mg/dL Final  05/07/2017 05:03 AM 3.44 (H) 0.44 - 1.00 mg/dL Final  05/06/2017 03:36 AM 3.49 (H) 0.44 - 1.00 mg/dL Final  05/05/2017 04:55 AM 3.11 (H) 0.44 - 1.00 mg/dL Final  05/04/2017 06:37 AM 3.08 (H) 0.44 - 1.00 mg/dL Final  05/04/2017 01:19 AM 3.07 (H) 0.44 - 1.00 mg/dL Final  05/03/2017 07:51 PM 2.91 (H) 0.44 - 1.00 mg/dL Final  04/28/2017 11:54 AM 3.13 (H) 0.60 - 0.93 mg/dL Final  03/06/2017 01:34 PM 2.99 (H) 0.44 - 1.00 mg/dL Final  07/08/2013 07:12 AM 1.55 (H) 0.50 - 1.10 mg/dL Final  06/08/2013 04:50 AM 1.69 (H) 0.50 - 1.10 mg/dL Final  06/07/2013 03:45 AM 1.86 (H) 0.50 - 1.10 mg/dL Final  06/06/2013 07:25 AM 1.65 (H) 0.50 - 1.10 mg/dL Final  06/05/2013 05:27 AM 1.45 (H) 0.50 - 1.10 mg/dL Final  06/04/2013 06:55 PM 1.43 (H) 0.50 - 1.10 mg/dL Final  06/04/2013 12:30 PM 1.57 (H) 0.50 - 1.10 mg/dL Final  04/12/2013 11:07 AM 1.46 (H) 0.50 - 1.10 mg/dL Final  11/20/2012 12:50 PM 1.77 (H) 0.50 - 1.10 mg/dL Final  08/27/2012 11:24 AM 1.65 (H) 0.50 - 1.10 mg/dL Final  11/12/2011 03:03 PM 1.2 0.4 - 1.2 mg/dL Final  10/06/2011 09:15 AM 1.62 (H) 0.50 -  1.10 mg/dL Final  10/05/2011 06:55 AM 1.71 (H) 0.50 - 1.10 mg/dL Final  10/04/2011 05:00 PM 1.56 (H) 0.50 - 1.10 mg/dL Final  10/04/2011 01:13 PM 1.43 (H) 0.50 - 1.10 mg/dL Final  11/08/2010 01:17 PM 1.3 (H) 0.4 - 1.2 mg/dL Final  06/01/2009 06:10 AM 1.08 0.4 - 1.2 mg/dL Final  05/31/2009 05:40 AM 1.11 0.4 - 1.2 mg/dL Final  05/30/2009 03:15 AM 1.66 DELTA CHECK NOTED (H) 0.4 - 1.2 mg/dL Final  05/29/2009 04:00 AM 1.04 0.4 - 1.2 mg/dL Final  05/28/2009 06:10 PM 0.8 0.4 - 1.2 mg/dL Final  05/28/2009 06:09 PM 0.77 0.4 - 1.2 mg/dL Final  05/28/2009 05:00 AM 0.95 0.4 - 1.2 mg/dL Final  05/26/2009 04:00 AM 1.00 0.4 - 1.2 mg/dL Final  05/25/2009 04:25 AM 1.11 0.4 - 1.2 mg/dL Final  05/24/2009 12:39 AM 1.23 (H) 0.4 - 1.2 mg/dL  Final  05/23/2009 05:56 PM 0.9 0.4 - 1.2 mg/dL Final    PMH:   Past Medical History:  Diagnosis Date  . Allergy   . Arthritis    "maybe in my lower back" (08/27/2012)  . 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 Chronic combined systolic and diastolic CHF (congestive heart failure) ([place])   . CKD (chronic kidney disease), stage IV ([place])   . Coronary artery disease    a. NSTEMI in 05/2009 s/p CABG (LIMA-LAD, SVG-diagonal, SVG-OM1/OM 2).   Marland Kitchen Dyspnea    with exertion  . Family history of anesthesia complication    Mom has a hard time to wake up  . Foot ulcer ([place])    "I've had them on both feet" (08/27/2012)  . Gouty arthritis    "right index finger" (08/27/2012)  . History of radiation therapy 06/09/13-07/06/13   left breast 50Gy  . Hyperlipidemia   . Hypertension   . Infection    right second toe  . Ischemic cardiomyopathy    a. 2011: EF 40-45%. b. EF 55-60% in 04/2017 but shortly after as inpatient was 45-50%.  Marland Kitchen LBBB (left bundle branch block)   . Mitral regurgitation   . Neuropathy    Hx; of B/L feet  . NSTEMI (non-ST elevated myocardial infarction) ([place]) 05/23/2009  . Osteomyelitis of foot ([place])   . PAD (peripheral artery disease) ([facility])    a. LE PAD (patient previously elected hold off L fem-pop), prev followed by Dr. Bridgett Larsson  . PONV (postoperative nausea and vomiting)   . Sinus headache    "occasionally" (08/27/2012)  . Type II diabetes mellitus ([place])   . Vitamin D deficiency     PSH:   Past Surgical History:  Procedure Laterality Date  . AMPUTATION Right 08/27/2012   Procedure: AMPUTATION RAY;  Surgeon: Newt Minion, MD;  Location: [place];  Service: Orthopedics;  Laterality: Right;  Right Foot 5th Ray Amputation  . AMPUTATION Left 07/08/2013   Procedure: AMPUTATION RAY;  Surgeon: Newt Minion, MD;  Location: [place];  Service: Orthopedics;  Laterality: Left;  Left Foot 2nd Ray Amputation  . AMPUTATION RAY Right 08/27/2012   5th  ray/notes 08/27/2012  . AMPUTATION TOE Right 03/06/2017   Procedure: AMPUTATION TOE, INTERPHANGEAL 2ND RIGHT;  Surgeon: [name], DPM;  Location: [place];  Service: Podiatry;  Laterality: Right;  . AV FISTULA PLACEMENT Right 11/11/2017   Procedure: RIGHT RADIOCEPHALIC  ARTERIOVENOUS FISTULA;  Surgeon: [name], MD;  Location: [place];  Service: Vascular;  Laterality: Right;  . BREAST LUMPECTOMY  04/20/2013   with biopsy      DR WAKEFIELD  . BREAST LUMPECTOMY WITH NEEDLE LOCALIZATION AND AXILLARY SENTINEL LYMPH NODE BX Left 04/20/2013   Procedure: LEFT BREAST WIRE GUIDED LUMPECTOMY AND AXILLARY SENTINEL LYMPH NODE BX;  Surgeon: Rolm Bookbinder, MD;  Location: [place];  Service: General;  Laterality: Left;  . CARDIAC CATHETERIZATION  05/24/2009   Archie Endo 05/24/2009 (08/27/2012)  . CATARACT EXTRACTION W/ INTRAOCULAR LENS  IMPLANT, BILATERAL  2000's  . COLONOSCOPY W/ BIOPSIES AND POLYPECTOMY  2010  . CORONARY ARTERY BYPASS GRAFT  2011   "CABG X4" (08/27/2012)  . DENTAL SURGERY  04/30/11   "1 implant" (08/27/2012)  . DILATION AND CURETTAGE OF UTERUS    . EYE SURGERY    . FINGER SURGERY Right    "index finger; turned out to be gout" (08/27/2012)    Allergies:  Allergies  Allergen Reactions  . Atenolol Other (See Comments)    Exacerbates gout  . Adhesive [Tape] Other (See Comments)    SKIN IS VERY SENSITIVE AND BRUISES AND TEARS EASILY; PLEASE USE AN ALTERNATIVE TO TAPE!!  . Atenolol Other (See Comments)    Connection to gout ?  Marland Kitchen Contrast Media [Iodinated Diagnostic Agents] Rash  . Latex Rash  . Latex Rash  . Omnipaque [Iohexol] Rash    Medications:   Prior to Admission medications   Medication Sig Start Date End Date Taking? Authorizing Provider  acetaminophen (TYLENOL) 500 MG tablet Take 1,000 mg by mouth every 6 (six) hours as needed for mild pain or headache.    Yes [provider]  sodium chloride (OCEAN) 0.65 % SOLN nasal spray Place 1 spray into both nostrils as  needed for congestion.   Yes [provider]  allopurinol (ZYLOPRIM) 100 MG tablet Take 100 mg by mouth 2 (two) times daily.    [provider]  allopurinol (ZYLOPRIM) 100 MG tablet Take 100 mg by mouth 2 (two) times daily.    [provider]  anastrozole (ARIMIDEX) 1 MG tablet Take 1 tablet (1 mg total) by mouth daily. 02/02/17   Magrinat, Virgie Dad, MD  aspirin EC 81 MG tablet Take 1 tablet (81 mg total) by mouth daily. Patient taking differently: Take 81 mg by mouth every evening.  05/18/17   Burtis Junes, NP  aspirin-acetaminophen-caffeine (EXCEDRIN MIGRAINE) [phone] MG tablet Take 1 tablet by mouth daily as needed (for sinus headaches.).    [provider]  atorvastatin (LIPITOR) 80 MG tablet TAKE 1 TABLET DAILY AT 6 PM Patient taking differently: Take 80 mg by mouth daily at 6 PM.  10/26/17   Liane Comber, NP  atorvastatin (LIPITOR) 80 MG tablet Take 80 mg by mouth daily. 10/27/17   [provider]  beta carotene w/minerals (OCUVITE) tablet Take 1 tablet by mouth daily.    [provider]  bumetanide (BUMEX) 2 MG tablet Take 1 tablet (2 mg total) by mouth 2 (two) times daily. 11/05/17   Isabelle Course, MD  carboxymethylcellulose (REFRESH PLUS) 0.5 % SOLN Place 1 drop into both eyes daily as needed (dry eyes).    [provider]  carvedilol (COREG) 25 MG tablet TAKE 1 TABLET TWICE DAILY WITH MEALS 10/26/17   Liane Comber, NP  carvedilol (COREG) 25 MG tablet Take 25 mg by mouth 2 (two) times daily. 10/27/17   [provider]  Cholecalciferol (VITAMIN D3) 2000 units TABS Take 2,000-4,000 Units by mouth daily.     [provider]  clindamycin (CLEOCIN) 300  MG capsule Take 1 capsule (300 mg total) by mouth 3 (three) times daily. 09/28/17   [name], DPM  docusate sodium (COLACE) 100 MG capsule Take 100 mg by mouth at bedtime.    [provider]  fluticasone (FLONASE) 50 MCG/ACT nasal spray Place  2 sprays into both nostrils daily as needed for allergies or rhinitis.    [provider]  furosemide (LASIX) 40 MG tablet Take 40 mg by mouth 2 (two) times daily. 2 tablets (80 mg ) am 1-2 tablets pm    [provider]  hydrALAZINE (APRESOLINE) 25 MG tablet TAKE 3 TABLETS THREE TIMES DAILY Patient taking differently: Take 25 mg by mouth 3 (three) times daily.  09/14/17   Unk Pinto, MD  hydrALAZINE (APRESOLINE) 25 MG tablet TAKE 3 TABLETS THREE TIMES DAILY Patient taking differently: Take by mouth 3 (three) times daily.  11/18/17   Unk Pinto, MD  insulin aspart (NOVOLOG) 100 UNIT/ML FlexPen Inject 3 Units into the skin daily. With your largest meal of the day 11/05/17   Isabelle Course, MD  insulin glargine (LANTUS) 100 UNIT/ML injection Inject 0.05 mLs (5 Units total) into the skin daily. 11/06/17   Isabelle Course, MD  insulin NPH-regular Human (NOVOLIN 70/30) (70-30) 100 UNIT/ML injection Inject 30-65 Units into the skin See admin instructions. 60-65 units into the skin in the morning before breakfast & 30-35 units in the evening, depending on BGL    [provider]  isosorbide dinitrate (ISORDIL) 10 MG tablet TAKE 1 TABLET THREE TIMES DAILY 10/26/17   Liane Comber, NP  Multiple Vitamins-Minerals (OCUVITE EYE HEALTH FORMULA PO) Take 1 tablet by mouth daily.    [provider]  NON FORMULARY [employer] Tonic and Homeopathic Remedy: Mix 1 ounce with water and drink one to two times a day (5-6 times a week)    [provider]  oxyCODONE (ROXICODONE) 5 MG immediate release tablet Take 1 tablet (5 mg total) by mouth every 6 (six) hours as needed for severe pain. 11/11/17   Rhyne, Hulen Shouts, PA-C  Polyethyl Glycol-Propyl Glycol (LUBRICANT EYE DROPS) 0.4-0.3 % SOLN Place 1-2 drops into both eyes 3 (three) times daily as needed (for dry eyes.).    [provider]  Vitamin D, Ergocalciferol, 2000 units CAPS Take 2,000 Units by mouth daily.     [provider]  Wound Dressings (MEDIHONEY WOUND/BURN DRESSING) GEL Apply to affected are 3 times a week, and cover with sterile dressing. Patient not taking: Reported on 11/26/2017 09/29/17   [name], DPM    Inpatient medications: . aspirin EC  81 mg Oral Daily  . atorvastatin  80 mg Oral q1800  . furosemide  60 mg Intravenous Q12H  . heparin  5,000 Units Subcutaneous Q8H  . hydrALAZINE  10 mg Oral Q8H  . insulin aspart  0-9 Units Subcutaneous TID WC  . isosorbide dinitrate  10 mg Oral TID  . sodium chloride flush  3 mL Intravenous Q12H    Discontinued Meds:   Medications Discontinued During This Encounter  Medication Reason  . hydrALAZINE (APRESOLINE) 25 MG tablet Duplicate  . anastrozole (ARIMIDEX) 1 MG tablet Duplicate  . aspirin EC 81 MG tablet Duplicate  . isosorbide dinitrate (ISORDIL) 10 MG tablet Duplicate  . naloxone (NARCAN) Newborn-WH injection 0.4 mg/mL P&T Policy: Therapeutic Substitute    Social History:  reports that she has quit smoking. She has never used smokeless tobacco. She reports that she drinks alcohol.  She reports that she does not use drugs.  Family History:   Family History  Problem Relation Age of Onset  . Kidney cancer Brother 41  . Hypertension Brother   . Breast cancer Sister 101       LCIS; BRCA negative  . Throat cancer Father 62       smoker  . Breast cancer Sister 18       Lobular breast cancer  . Breast cancer Maternal Aunt        dx in her 88s    Pertinent items are noted in HPI. Weight change:   Intake/Output Summary (Last 24 hours) at 11/26/2017 1751 Last data filed at 11/26/2017 1549 Gross per 24 hour  Intake 100.92 ml  Output -  Net 100.92 ml   BP (!) 142/60   Pulse (!) 51   Temp (!) 92.8 F (33.8 C) (Rectal)   Resp 15   Ht '5\' 5"'  (1.651 m)   Wt 81.5 kg   SpO2 96%   BMI 29.90 kg/m  Vitals:   11/26/17 1515 11/26/17 1530 11/26/17 1545 11/26/17 1700  BP: (!) 142/52 (!) 143/59 (!) 142/60   Pulse:  (!) 55  (!) 51   Resp:  15 15   Temp:    (!) 92.8 F (33.8 C)  TempSrc:    Rectal  SpO2: 94%  96%   Weight:      Height:         General appearance: fatigued and no distress Head: Normocephalic, without obvious abnormality, atraumatic Resp: rales bibasilar Cardio: no rub and bradycardic at 51 GI: abnormal findings:  obese and +BS, soft, nontender Extremities: edema 2+ pitting of lower extremities and L forearm AVF +T/B Neuro: no asterixis  Labs: Basic Metabolic Panel: Recent Labs  Lab 11/26/17 1331 11/26/17 1352  NA 140 137  K 4.2 4.0  CL 107 104  CO2 22  --   GLUCOSE 98 89  BUN 104* 104*  CREATININE 3.99* 4.10*  ALBUMIN 3.0*  --   CALCIUM 8.7*  --    Liver Function Tests: Recent Labs  Lab 11/26/17 1331  AST 34  ALT 40  ALKPHOS 107  BILITOT 0.8  PROT 5.3*  ALBUMIN 3.0*   No results for input(s): LIPASE, AMYLASE in the last 168 hours. No results for input(s): AMMONIA in the last 168 hours. CBC: Recent Labs  Lab 11/26/17 1331 11/26/17 1352  WBC 7.8  --   NEUTROABS 6.0  --   HGB 10.4* 11.6*  HCT 33.8* 34.0*  MCV 88.3  --   PLT 180  --    PT/INR: '@LABRCNTIP' (inr:5) Cardiac Enzymes: )No results for input(s): CKTOTAL, CKMB, CKMBINDEX, TROPONINI in the last 168 hours. CBG: Recent Labs  Lab 11/26/17 1322 11/26/17 1647 11/26/17 1704 11/26/17 1738  GLUCAP 111* 35* 35* 159*    Iron Studies: No results for input(s): IRON, TIBC, TRANSFERRIN, FERRITIN in the last 168 hours.  Xrays/Other Studies: Dg Chest Portable 1 View  Result Date: 11/26/2017 CLINICAL DATA:  Fall. EXAM: PORTABLE CHEST 1 VIEW COMPARISON:  11/03/2017. FINDINGS: Prior CABG. Cardiomegaly with pulmonary venous congestion, bilateral interstitial prominence and right-sided pleural effusion. Findings consistent with CHF. Findings have progressed from prior study of 11/03/2017. IMPRESSION: Prior CABG. Cardiomegaly with bilateral pulmonary infiltrates/edema and right-sided pleural effusion.  Findings consistent with CHF. Findings have progressed from prior study of 11/03/2017. Electronically Signed   By: Marcello Moores  Register   On: 11/26/2017 13:55     Assessment/Plan: 1.  AKI/CKD stage  5 in setting of bradycardia and acute on chronic, combined CHF.  She is having mild uremic symptoms with nausea but no asterixis and not much off her baseline creatinine. 2. Acute on chronic combined CHF/ICM- will start high dose IV lasix and follow her response.  If she does not adequately diurese, I discussed with she and her husband that we may need to initiate HD during this hospitalization.  They were both amenable to that. 3. Bradycardia- hold carvedilol and follow 4. Hypoglycemia- improved with D5 but will likely need to lower her insulin dose due to her worsening renal function. 5. AMS- ABG with hypoxemia but good CO2.  Likely due to hypoglycemia but will also check ammonia level given her diastolic dysfunction. 6. Anemia of CKD stage 5- stable 7. Vascular access- has a new left radiocephalic AVF which is maturing nicely but will need another 10 weeks to fully mature.  May need a tunneled HD catheter if she doesn't respond to IV lasix.  Will consult [facility] if needed.    Governor Rooks Pheonix Wis[place] 11/26/2017, 5:51 PM

## 2017-11-26 NOTE — ED Notes (Signed)
Xray bedside.

## 2017-11-26 NOTE — ED Triage Notes (Signed)
Pt arrives to ED from [facility] with complaints of bradycardia in the 30s, was given 0.5mg  atropine, 20 g of D10, and about 125 of NS by EMS and brought up to 50s with PVCs since this morning. EMS reports call was originally placed for "renal failure". Pt has recently placed av fistula on th right side, is not in use yet and call was placed to possibly get pt peritoneal dialysis while waiting for fistula to be used. Abdomen is also distended, lung sounds clear, pt had recent fall, bruising under both eyes. Pt placed in position of comfort with bed locked and lowered, call bell in reach.

## 2017-11-26 NOTE — ED Notes (Signed)
Attempted report x1. Floor stating waiting to hear back from bed placement.

## 2017-11-26 NOTE — Progress Notes (Signed)
Nephrology in to see pt, blood sugar 159, paged Dr Allyson Sabal wit update as ordered, pt more alert and to bathroom ,ua collected, family at bedside and update

## 2017-11-26 NOTE — Significant Event (Signed)
Rapid Response Event Note  Overview: Time Called: 6435 Arrival Time: 3912 Event Type: Neurologic  Initial Focused Assessment: Called by charge nurse to patient's bedside d/t lethargy. Upon arrival, patient lethargic, but arousable. Vital signs stable, except unable to obtain temperature. Patient oriented x 4. Feels cool and slightly diaphoretic. Patient states she does not feel too well today.  Interventions: Paged Dr Jimmie Molly juice and grape juice given by staff-BG unchanged D5W 35mL IVP D10 IV infusion at 50 mL/hr  Plan of Care (if not transferred): Continue to recheck blood sugars as needed. Monitor for LOC changes. Call RRT prn. Event Summary: Name of Physician Notified: Dr Allyson Sabal at 1718    at    Outcome: Stayed in room and stabalized  Event End Time: [place]

## 2017-11-26 NOTE — ED Notes (Addendum)
Spoke to Dr. Allyson Sabal, asked about giving another 60mg  of lasix, since she just received 60mg  less than an hour ago, per provider hold until patient gets up to the floor. No further orders at this time.

## 2017-11-26 NOTE — ED Notes (Signed)
ED Provider at bedside. 

## 2017-11-27 ENCOUNTER — Ambulatory Visit (HOSPITAL_BASED_OUTPATIENT_CLINIC_OR_DEPARTMENT_OTHER): Payer: Medicare Other

## 2017-11-27 DIAGNOSIS — I132 Hypertensive heart and chronic kidney disease with heart failure and with stage 5 chronic kidney disease, or end stage renal disease: Secondary | ICD-10-CM | POA: Diagnosis not present

## 2017-11-27 DIAGNOSIS — E160 Drug-induced hypoglycemia without coma: Secondary | ICD-10-CM

## 2017-11-27 DIAGNOSIS — R0602 Shortness of breath: Secondary | ICD-10-CM | POA: Diagnosis not present

## 2017-11-27 DIAGNOSIS — R531 Weakness: Secondary | ICD-10-CM | POA: Diagnosis not present

## 2017-11-27 DIAGNOSIS — E1165 Type 2 diabetes mellitus with hyperglycemia: Secondary | ICD-10-CM | POA: Diagnosis not present

## 2017-11-27 DIAGNOSIS — N179 Acute kidney failure, unspecified: Secondary | ICD-10-CM | POA: Diagnosis not present

## 2017-11-27 DIAGNOSIS — R001 Bradycardia, unspecified: Secondary | ICD-10-CM | POA: Diagnosis not present

## 2017-11-27 DIAGNOSIS — Z961 Presence of intraocular lens: Secondary | ICD-10-CM | POA: Diagnosis present

## 2017-11-27 DIAGNOSIS — E11621 Type 2 diabetes mellitus with foot ulcer: Secondary | ICD-10-CM | POA: Diagnosis not present

## 2017-11-27 DIAGNOSIS — G9341 Metabolic encephalopathy: Secondary | ICD-10-CM

## 2017-11-27 DIAGNOSIS — Z89421 Acquired absence of other right toe(s): Secondary | ICD-10-CM | POA: Diagnosis not present

## 2017-11-27 DIAGNOSIS — I5189 Other ill-defined heart diseases: Secondary | ICD-10-CM | POA: Diagnosis not present

## 2017-11-27 DIAGNOSIS — T383X5A Adverse effect of insulin and oral hypoglycemic [antidiabetic] drugs, initial encounter: Secondary | ICD-10-CM

## 2017-11-27 DIAGNOSIS — I5033 Acute on chronic diastolic (congestive) heart failure: Secondary | ICD-10-CM

## 2017-11-27 DIAGNOSIS — Z9841 Cataract extraction status, right eye: Secondary | ICD-10-CM | POA: Diagnosis not present

## 2017-11-27 DIAGNOSIS — E876 Hypokalemia: Secondary | ICD-10-CM | POA: Diagnosis present

## 2017-11-27 DIAGNOSIS — Z5111 Encounter for antineoplastic chemotherapy: Secondary | ICD-10-CM | POA: Diagnosis not present

## 2017-11-27 DIAGNOSIS — N185 Chronic kidney disease, stage 5: Secondary | ICD-10-CM | POA: Diagnosis not present

## 2017-11-27 DIAGNOSIS — I340 Nonrheumatic mitral (valve) insufficiency: Secondary | ICD-10-CM | POA: Diagnosis not present

## 2017-11-27 DIAGNOSIS — Z888 Allergy status to other drugs, medicaments and biological substances status: Secondary | ICD-10-CM | POA: Diagnosis not present

## 2017-11-27 DIAGNOSIS — I252 Old myocardial infarction: Secondary | ICD-10-CM | POA: Diagnosis not present

## 2017-11-27 DIAGNOSIS — Z794 Long term (current) use of insulin: Secondary | ICD-10-CM | POA: Diagnosis not present

## 2017-11-27 DIAGNOSIS — J90 Pleural effusion, not elsewhere classified: Secondary | ICD-10-CM | POA: Diagnosis not present

## 2017-11-27 DIAGNOSIS — T502X5A Adverse effect of carbonic-anhydrase inhibitors, benzothiadiazides and other diuretics, initial encounter: Secondary | ICD-10-CM | POA: Diagnosis present

## 2017-11-27 DIAGNOSIS — Z853 Personal history of malignant neoplasm of breast: Secondary | ICD-10-CM | POA: Diagnosis not present

## 2017-11-27 DIAGNOSIS — J9811 Atelectasis: Secondary | ICD-10-CM | POA: Diagnosis not present

## 2017-11-27 DIAGNOSIS — E119 Type 2 diabetes mellitus without complications: Secondary | ICD-10-CM | POA: Diagnosis not present

## 2017-11-27 DIAGNOSIS — E8770 Fluid overload, unspecified: Secondary | ICD-10-CM | POA: Diagnosis not present

## 2017-11-27 DIAGNOSIS — Z9842 Cataract extraction status, left eye: Secondary | ICD-10-CM | POA: Diagnosis not present

## 2017-11-27 DIAGNOSIS — R0902 Hypoxemia: Secondary | ICD-10-CM | POA: Diagnosis present

## 2017-11-27 DIAGNOSIS — I2510 Atherosclerotic heart disease of native coronary artery without angina pectoris: Secondary | ICD-10-CM | POA: Diagnosis not present

## 2017-11-27 DIAGNOSIS — N2581 Secondary hyperparathyroidism of renal origin: Secondary | ICD-10-CM | POA: Diagnosis not present

## 2017-11-27 DIAGNOSIS — I5043 Acute on chronic combined systolic (congestive) and diastolic (congestive) heart failure: Secondary | ICD-10-CM | POA: Diagnosis not present

## 2017-11-27 DIAGNOSIS — Z4901 Encounter for fitting and adjustment of extracorporeal dialysis catheter: Secondary | ICD-10-CM | POA: Diagnosis not present

## 2017-11-27 DIAGNOSIS — J810 Acute pulmonary edema: Secondary | ICD-10-CM | POA: Diagnosis not present

## 2017-11-27 DIAGNOSIS — Z951 Presence of aortocoronary bypass graft: Secondary | ICD-10-CM | POA: Diagnosis not present

## 2017-11-27 DIAGNOSIS — R079 Chest pain, unspecified: Secondary | ICD-10-CM | POA: Diagnosis not present

## 2017-11-27 DIAGNOSIS — I10 Essential (primary) hypertension: Secondary | ICD-10-CM | POA: Diagnosis not present

## 2017-11-27 DIAGNOSIS — E1149 Type 2 diabetes mellitus with other diabetic neurological complication: Secondary | ICD-10-CM | POA: Diagnosis not present

## 2017-11-27 DIAGNOSIS — D631 Anemia in chronic kidney disease: Secondary | ICD-10-CM | POA: Diagnosis present

## 2017-11-27 DIAGNOSIS — E11649 Type 2 diabetes mellitus with hypoglycemia without coma: Secondary | ICD-10-CM | POA: Diagnosis not present

## 2017-11-27 DIAGNOSIS — N186 End stage renal disease: Secondary | ICD-10-CM | POA: Diagnosis not present

## 2017-11-27 DIAGNOSIS — E1122 Type 2 diabetes mellitus with diabetic chronic kidney disease: Secondary | ICD-10-CM | POA: Diagnosis not present

## 2017-11-27 DIAGNOSIS — Z452 Encounter for adjustment and management of vascular access device: Secondary | ICD-10-CM | POA: Diagnosis not present

## 2017-11-27 DIAGNOSIS — N19 Unspecified kidney failure: Secondary | ICD-10-CM | POA: Diagnosis not present

## 2017-11-27 LAB — ECHOCARDIOGRAM COMPLETE
Height: 65 in
Weight: 3033.6 oz

## 2017-11-27 LAB — COMPREHENSIVE METABOLIC PANEL
ALBUMIN: 2.8 g/dL — AB (ref 3.5–5.0)
ALT: 34 U/L (ref 0–44)
ANION GAP: 10 (ref 5–15)
AST: 24 U/L (ref 15–41)
Alkaline Phosphatase: 101 U/L (ref 38–126)
BUN: 100 mg/dL — AB (ref 8–23)
CHLORIDE: 102 mmol/L (ref 98–111)
CO2: 24 mmol/L (ref 22–32)
Calcium: 8.9 mg/dL (ref 8.9–10.3)
Creatinine, Ser: 3.95 mg/dL — ABNORMAL HIGH (ref 0.44–1.00)
GFR calc Af Amer: 12 mL/min — ABNORMAL LOW (ref >60)
GFR calc non Af Amer: 11 mL/min — ABNORMAL LOW (ref >60)
GLUCOSE: 177 mg/dL — AB (ref 70–99)
POTASSIUM: 4.1 mmol/L (ref 3.5–5.1)
Sodium: 136 mmol/L (ref 135–145)
Total Bilirubin: 0.7 mg/dL (ref 0.3–1.2)
Total Protein: 4.9 g/dL — ABNORMAL LOW (ref 6.5–8.1)

## 2017-11-27 LAB — GLUCOSE, CAPILLARY
GLUCOSE-CAPILLARY: 175 mg/dL — AB (ref 70–99)
GLUCOSE-CAPILLARY: 182 mg/dL — AB (ref 70–99)
GLUCOSE-CAPILLARY: 195 mg/dL — AB (ref 70–99)
Glucose-Capillary: 201 mg/dL — ABNORMAL HIGH (ref 70–99)
Glucose-Capillary: 170 mg/dL — ABNORMAL HIGH (ref 70–99)
Glucose-Capillary: 166 mg/dL — ABNORMAL HIGH (ref 70–99)
Glucose-Capillary: 207 mg/dL — ABNORMAL HIGH (ref 70–99)
Glucose-Capillary: 176 mg/dL — ABNORMAL HIGH (ref 70–99)

## 2017-11-27 LAB — CBC
HEMATOCRIT: 29.9 % — AB (ref 36.0–46.0)
HEMOGLOBIN: 9.3 g/dL — AB (ref 12.0–15.0)
MCH: 26.7 pg (ref 26.0–34.0)
MCHC: 31.1 g/dL (ref 30.0–36.0)
MCV: 85.9 fL (ref 78.0–100.0)
Platelets: 186 10*3/uL (ref 150–400)
RBC: 3.48 MIL/uL — ABNORMAL LOW (ref 3.87–5.11)
RDW: 16.9 % — AB (ref 11.5–15.5)
WBC: 7.3 10*3/uL (ref 4.0–10.5)

## 2017-11-27 MED ORDER — FUROSEMIDE 10 MG/ML IJ SOLN
160.0000 mg | Freq: Four times a day (QID) | INTRAVENOUS | Status: DC
  Administered 2017-11-27 – 2017-12-02 (×20): 160 mg via INTRAVENOUS
  Filled 2017-11-27 (×2): qty 16
  Filled 2017-11-27: qty 10
  Filled 2017-11-27: qty 16
  Filled 2017-11-27: qty 2
  Filled 2017-11-27: qty 10
  Filled 2017-11-27 (×11): qty 16
  Filled 2017-11-27 (×3): qty 10
  Filled 2017-11-27: qty 16

## 2017-11-27 MED ORDER — METOLAZONE 5 MG PO TABS
10.0000 mg | ORAL_TABLET | Freq: Every day | ORAL | Status: DC
  Administered 2017-11-27 – 2017-12-01 (×5): 10 mg via ORAL
  Filled 2017-11-27 (×7): qty 2

## 2017-11-27 MED ORDER — DARBEPOETIN ALFA 100 MCG/0.5ML IJ SOSY
100.0000 ug | PREFILLED_SYRINGE | INTRAMUSCULAR | Status: DC
  Administered 2017-11-27: 100 ug via SUBCUTANEOUS
  Filled 2017-11-27: qty 0.5

## 2017-11-27 NOTE — Progress Notes (Signed)
Subjective:  Only made 900 of urine but says her nausea and taste are improved.  Still overloaded- BUN and crt down some.  She is accepting but still having [name] difficult time dealing with all this  Objective Vital signs in last 24 hours: Vitals:   11/26/17 1956 11/26/17 1959 11/27/17 0043 11/27/17 0435  BP: (!) 127/57 (!) 127/57 (!) 144/45 (!) 143/84  Pulse: (!) 57 (!) 57 62 69  Resp: 18 18 20 18   Temp:    97.7 F (36.5 C)  TempSrc:    Oral  SpO2:  99% 99% 96%  Weight:    86 kg  Height:       Weight change:   Intake/Output Summary (Last 24 hours) at 11/27/2017 0851 Last data filed at 11/27/2017 0151 Gross per 24 hour  Intake 710.7 ml  Output 900 ml  Net -189.3 ml    Assessment/ Plan: Pt is [name] 72 y.o. yo female with CAD, CHF- EF 39 and diastolic HF, DM and advanced CKD who was admitted on 11/26/2017 with  Second hospitalization in one month for volume overload as well as some uremic sxms  Assessment/Plan: 1. CHF- very volume overloaded- did not respond that great to IV diuretics overnight.  Will increase meds to max including metolazone.  She is also on low dose hydralazine 2. CKD-stage 5- s/p AVF on 8/7.  Is likely having uremic sxms at least on admit.  Says symptoms are better and numbers are not worse.  Is not quite emotionally ready for all this.  I am willing to increase diuretics to max and give it another day or 2 but really think we will need to initiate dialysis this admission.  She understands but I want her to be on board- give it [name] little more time to sink in.  If decide to start will need TDC as AVF is not ready  3. Anemia- hgb drop from 11.6 to 9.3 ?? - check iron stores and start ESA  4. Secondary hyperparathyroidism- check phos and intact PTH and treat as needed   [name]    Labs: Basic Metabolic Panel: Recent Labs  Lab 11/26/17 1331 11/26/17 1352 11/27/17 0614  NA 140 137 136  K 4.2 4.0 4.1  CL 107 104 102  CO2 22  --  24  GLUCOSE 98 89 177*   BUN 104* 104* 100*  CREATININE 3.99* 4.10* 3.95*  CALCIUM 8.7*  --  8.9   Liver Function Tests: Recent Labs  Lab 11/26/17 1331 11/27/17 0614  AST 34 24  ALT 40 34  ALKPHOS 107 101  BILITOT 0.8 0.7  PROT 5.3* 4.9*  ALBUMIN 3.0* 2.8*   No results for input(s): LIPASE, AMYLASE in the last 168 hours. Recent Labs  Lab 11/26/17 1903  AMMONIA 59*   CBC: Recent Labs  Lab 11/26/17 1331 11/26/17 1352 11/27/17 0614  WBC 7.8  --  7.3  NEUTROABS 6.0  --   --   HGB 10.4* 11.6* 9.3*  HCT 33.8* 34.0* 29.9*  MCV 88.3  --  85.9  PLT 180  --  186   Cardiac Enzymes: No results for input(s): CKTOTAL, CKMB, CKMBINDEX, TROPONINI in the last 168 hours. CBG: Recent Labs  Lab 11/27/17 0120 11/27/17 0228 11/27/17 0431 11/27/17 0644 11/27/17 0815  GLUCAP 201* 170* 166* 175* 182*    Iron Studies: No results for input(s): IRON, TIBC, TRANSFERRIN, FERRITIN in the last 72 hours. Studies/Results: Dg Chest Portable 1 View  Result Date: 11/26/2017 CLINICAL DATA:  Fall. EXAM: PORTABLE CHEST 1 VIEW COMPARISON:  11/03/2017. FINDINGS: Prior CABG. Cardiomegaly with pulmonary venous congestion, bilateral interstitial prominence and right-sided pleural effusion. Findings consistent with CHF. Findings have progressed from prior study of 11/03/2017. IMPRESSION: Prior CABG. Cardiomegaly with bilateral pulmonary infiltrates/edema and right-sided pleural effusion. Findings consistent with CHF. Findings have progressed from prior study of 11/03/2017. Electronically Signed   By: Marcello Moores  Register   On: 11/26/2017 13:55   Medications: Infusions: . sodium chloride    . dextrose 25 mL/hr at 11/27/17 0142  . furosemide 120 mg (11/27/17 0417)    Scheduled Medications: . aspirin EC  81 mg Oral Daily  . atorvastatin  80 mg Oral q1800  . heparin  5,000 Units Subcutaneous Q8H  . hydrALAZINE  10 mg Oral Q8H  . insulin aspart  0-9 Units Subcutaneous TID WC  . isosorbide dinitrate  10 mg Oral TID  . sodium  chloride flush  3 mL Intravenous Q12H    have reviewed scheduled and prn medications.  Physical Exam: General: alert, sitting up eating breakfast Heart: RRR Lungs: dec BS at bases- O2 sat adequate on room air Abdomen: abdominal wall edema Extremities: pitting edema  Dialysis Access: right AVF- patent placed  8/7   11/27/2017,8:51 AM  LOS: 0 days

## 2017-11-27 NOTE — Progress Notes (Signed)
Pt unable to urinate. States she does not have the urge. Pt bladder scanned. Showed 0 ml.  Will continue to monitor.

## 2017-11-27 NOTE — Progress Notes (Signed)
  Echocardiogram 2D Echocardiogram has been performed.  [name] 11/27/2017, 12:16 PM

## 2017-11-27 NOTE — Consult Note (Addendum)
[facility] Nurse wound consult note Reason for Consult: Consult requested for left foot wound.  Pt is followed by vascular service as an outpatient and has been using Aquacel dressings 3 times a week. 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  Pt is wearing post-op walking shoe to offload pressure to the site.  She states she will follow-up with her physician after discharge. Please re-consult if further assistance is needed.  Thank-you,  Julien Girt MSN, [place], [place], [place], [place]

## 2017-11-27 NOTE — Progress Notes (Signed)
PROGRESS NOTE        PATIENT DETAILS Name: [name] Age: 72 y.o. Sex: female Date of Birth: 06-16-1945 Admit Date: 11/26/2017 Admitting Physician Reyne Dumas, MD [id number], Gwyndolyn Saxon, MD  Brief Narrative: Patient is a 72 y.o. female with history of CKD stage IV, CAD status post CABG, chronic combined systolic and diastolic heart failure with anasarca in the setting of worsening renal function.  Plans are to initiate high-dose IV diuretics, if no response then probably will require initiation of dialysis.  See below for further details  Subjective: Remains significantly volume overloaded-has edema up to her thighs.  Has bibasilar rales as well.  No shortness of breath at rest.   Assessment/Plan: Anasarca in the setting of worsening renal function and decompensated combined systolic and diastolic heart failure: Hardly any significant response to high-dose diuretics started last night, still with edema up to her thighs. Nephrology following with plans to maximize diuretic regimen today-if still no response, then probably will require initiation of hemodialysis.  Follow electrolytes, weights, intake/output.  Echo pending.  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  Nephrology following.  Sinus bradycardia: Heart rate has improved-continue to hold beta-blocker-and await washout.  TSH within normal limits.,  Per H&P, patient was given atropine by enroute to the ED.   Acute metabolic encephalopathy: Apparently very somnolent on admission-probably secondary to uremia in the setting of worsening renal function.  This morning she was much more awake and alert.  CAD status post CABG in 2011: No chest pain shortness of breath is due to volume overload-continue aspirin.  Hypertension: Controlled, continue hydralazine and  Imdur.  DM-2: Episode of hypoglycemia yesterday-CBGs currently stable with SSI.  Will probably require decreased amount of insulin as patient's renal function has worsened.  Anemia: Probably secondary to acute illness and underlying CKD-no evidence of acute blood loss at this time.  Defer to nephrology for now.  History of breast cancer status post radiation and lumpectomy in 2015: Will need to resume anastrozole in the next few days.  DVT Prophylaxis: Prophylactic  Heparin  Code Status: Full code   Family Communication: None at bedside  Disposition Plan: Remain inpatient-will require several more days of hospitalization prior to discharge.  Antimicrobial agents: Anti-infectives (From admission, onward)   None     Procedures: None  CONSULTS:  nephrology  Time spent: 35 minutes-Greater than 50% of this time was spent in counseling, explanation of diagnosis, planning of further management, and coordination of care.  MEDICATIONS: Scheduled Meds: . aspirin EC  81 mg Oral Daily  . atorvastatin  80 mg Oral q1800  . darbepoetin (ARANESP) injection - NON-DIALYSIS  100 mcg Subcutaneous Q Fri-1800  . heparin  5,000 Units Subcutaneous Q8H  . hydrALAZINE  10 mg Oral Q8H  . insulin aspart  0-9 Units Subcutaneous TID WC  . isosorbide dinitrate  10 mg Oral TID  . metolazone  10 mg Oral Daily  . sodium chloride flush  3 mL Intravenous Q12H   Continuous Infusions: . sodium chloride    . dextrose 25 mL/hr at 11/27/17 0142  . furosemide 160 mg (11/27/17 1051)   PRN Meds:.sodium chloride, acetaminophen **OR** acetaminophen, dextrose, levalbuterol, MUSCLE RUB, ondansetron **OR** ondansetron (ZOFRAN) IV, sodium chloride flush  PHYSICAL EXAM: Vital signs: Vitals:   11/26/17 1959 11/27/17 0043 11/27/17 0435 11/27/17 1130  BP: (!) 127/57 (!) 144/45 (!) 143/84 (!) 117/49  Pulse: (!) 57 62 69 70  Resp: 18 20 18 20   Temp:   97.7 F (36.5 C) 98.2 F (36.8 C)  TempSrc:   Oral Oral   SpO2: 99% 99% 96% 96%  Weight:   86 kg   Height:       Filed Weights   11/26/17 1321 11/27/17 0435  Weight: 81.5 kg 86 kg   Body mass index is 31.55 kg/m.   General appearance :Awake, alert, not in any distress.  Eyes:.Pink conjunctiva HEENT: Atraumatic and Normocephalic Neck: supple, + JVD.  Resp:Good air entry bilaterally, bibasilar rales CVS: S1 S2 regular, 3/6 systolic murmur GI: Bowel sounds present, Non tender and not distended with no gaurding, rigidity or rebound.No organomegaly Extremities: B/L Lower Ext shows 3+ edema, both legs are warm to touch Neurology:  speech clear,Non focal, sensation is grossly intact. Psychiatric: Normal judgment and insight. Alert and oriented x 3. Normal mood. Musculoskeletal:No digital cyanosis Skin:No Rash, warm and dry Wounds:N/A  I have personally reviewed following labs and imaging studies  LABORATORY DATA: CBC: Recent Labs  Lab 11/26/17 1331 11/26/17 1352 11/27/17 0614  WBC 7.8  --  7.3  NEUTROABS 6.0  --   --   HGB 10.4* 11.6* 9.3*  HCT 33.8* 34.0* 29.9*  MCV 88.3  --  85.9  PLT 180  --  865    Basic Metabolic Panel: Recent Labs  Lab 11/26/17 1331 11/26/17 1352 11/27/17 0614  NA 140 137 136  K 4.2 4.0 4.1  CL 107 104 102  CO2 22  --  24  GLUCOSE 98 89 177*  BUN 104* 104* 100*  CREATININE 3.99* 4.10* 3.95*  CALCIUM 8.7*  --  8.9  MG 2.7*  --   --     GFR: Estimated Creatinine Clearance: 14.1 mL/min (A) (by C-G formula based on SCr of 3.95 mg/dL (H)).  Liver Function Tests: Recent Labs  Lab 11/26/17 1331 11/27/17 0614  AST 34 24  ALT 40 34  ALKPHOS 107 101  BILITOT 0.8 0.7  PROT 5.3* 4.9*  ALBUMIN 3.0* 2.8*   No results for input(s): LIPASE, AMYLASE in the last 168 hours. Recent Labs  Lab 11/26/17 1903  AMMONIA 59*    Coagulation Profile: Recent Labs  Lab 11/26/17 1331  INR 1.24    Cardiac Enzymes: No results for input(s): CKTOTAL, CKMB, CKMBINDEX, TROPONINI in the last 168  hours.  BNP (last 3 results) No results for input(s): PROBNP in the last 8760 hours.  HbA1C: Recent Labs    11/26/17 1718  HGBA1C 6.1*    CBG: Recent Labs  Lab 11/27/17 0228 11/27/17 0431 11/27/17 0644 11/27/17 0815 11/27/17 1238  GLUCAP 170* 166* 175* 182* 195*    Lipid Profile: No results for input(s): CHOL, HDL, LDLCALC, TRIG, CHOLHDL, LDLDIRECT in the last 72 hours.  Thyroid Function Tests: Recent Labs    11/26/17 1718  TSH 2.307  FREET4 1.39    Anemia Panel: No results for input(s): VITAMINB12, FOLATE, FERRITIN, TIBC, IRON, RETICCTPCT in the last 72 hours.  Urine analysis:    Component Value Date/Time   COLORURINE STRAW (A) 11/26/2017 1818   APPEARANCEUR CLEAR 11/26/2017 1818   LABSPEC 1.008 11/26/2017 1818   PHURINE 5.0 11/26/2017 1818   GLUCOSEU NEGATIVE 11/26/2017 1818   HGBUR NEGATIVE 11/26/2017 Echo NEGATIVE 11/26/2017 1818  KETONESUR NEGATIVE 11/26/2017 1818   PROTEINUR 30 (A) 11/26/2017 1818   UROBILINOGEN 0.2 05/30/2009 1500   NITRITE NEGATIVE 11/26/2017 1818   LEUKOCYTESUR NEGATIVE 11/26/2017 1818    Sepsis Labs: Lactic Acid, Venous    Component Value Date/Time   LATICACIDVEN 0.77 11/03/2017 1144    MICROBIOLOGY: No results found for this or any previous visit (from the past 240 hour(s)).  RADIOLOGY STUDIES/RESULTS: Dg Chest 2 View  Result Date: 11/03/2017 CLINICAL DATA:  Follow-up infiltrate EXAM: CHEST - 2 VIEW COMPARISON:  Earlier study of 11/03/2017 FINDINGS: Enlargement of cardiac silhouette post CABG. Atherosclerotic calcification aorta. Mediastinal contours normal. Slight pulmonary vascular congestion. Improved interstitial edema since the earlier study. Persistent RIGHT pleural effusion and basilar atelectasis. No pneumothorax. Bones demineralized. IMPRESSION: Enlargement of cardiac silhouette post CABG with pulmonary vascular congestion. Slightly improved pulmonary edema. Persistent RIGHT pleural effusion and  basilar atelectasis. Electronically Signed   By: Lavonia Dana M.D.   On: 11/03/2017 18:52   Ct Head Wo Contrast  Result Date: 11/03/2017 CLINICAL DATA:  Fall.  Altered mental status. EXAM: CT HEAD WITHOUT CONTRAST TECHNIQUE: Contiguous axial images were obtained from the base of the skull through the vertex without intravenous contrast. COMPARISON:  None. FINDINGS: Brain: There is no evidence of acute infarct, intracranial hemorrhage, mass, midline shift, or extra-axial fluid collection. There is a small left cerebellar infarct with volume loss compatible with a chronic infarct. Bilateral basal ganglia lacunar infarcts are also most likely chronic. Patchy periventricular white matter hypodensities are nonspecific but compatible with mild chronic small vessel ischemic disease. Vascular: Calcified atherosclerosis at the skull base. No hyperdense vessel. Skull: No fracture or suspicious osseous lesion. Moderate-sized right frontal scalp hematoma with underlying prominent vascular channel in the frontal bone. Sinuses/Orbits: Chronic sphenoid sinusitis with sinus expansion, wall thickening and sclerosis, and complex and partially calcified central material in the sinus. Milder mucosal thickening elsewhere in the paranasal sinuses. Clear mastoid air cells. Bilateral cataract extraction. Other: None. IMPRESSION: 1. No evidence of acute intracranial abnormality. 2. Right frontal scalp hematoma.  No skull fracture. 3. Small basal ganglia and cerebellar infarcts, likely chronic. 4. Mild chronic small vessel ischemic white matter disease. 5. Chronic sphenoid sinusitis. These results were called by telephone at the time of interpretation on 11/03/2017 at 9:16 am to Dr. Marda Stalker , who verbally acknowledged these results. Electronically Signed   By: Logan Bores M.D.   On: 11/03/2017 09:29   Dg Chest Portable 1 View  Result Date: 11/26/2017 CLINICAL DATA:  Fall. EXAM: PORTABLE CHEST 1 VIEW COMPARISON:   11/03/2017. FINDINGS: Prior CABG. Cardiomegaly with pulmonary venous congestion, bilateral interstitial prominence and right-sided pleural effusion. Findings consistent with CHF. Findings have progressed from prior study of 11/03/2017. IMPRESSION: Prior CABG. Cardiomegaly with bilateral pulmonary infiltrates/edema and right-sided pleural effusion. Findings consistent with CHF. Findings have progressed from prior study of 11/03/2017. Electronically Signed   By: Marcello Moores  Register   On: 11/26/2017 13:55   Dg Chest Portable 1 View  Result Date: 11/03/2017 CLINICAL DATA:  Shortness of breath and recent fall, initial encounter EXAM: PORTABLE CHEST 1 VIEW COMPARISON:  None. FINDINGS: Cardiac shadow is enlarged. Aortic calcifications are again seen. The lungs are well aerated bilaterally with patchy infiltrate in the right lung base. No sizable effusion is noted. No bony abnormality is seen. IMPRESSION: Right basilar infiltrate. Followup PA and lateral chest X-ray is recommended in 3-4 weeks following trial of antibiotic therapy to ensure resolution and exclude underlying malignancy. Electronically Signed  By: Inez Catalina M.D.   On: 11/03/2017 08:48     LOS: 0 days   Oren Binet, MD  [facility]  If 7PM-7AM, please contact night-coverage  Please page via [email or web address]-click on MD name and type text message  11/27/2017, 1:12 PM

## 2017-11-28 ENCOUNTER — Other Ambulatory Visit: Payer: Self-pay

## 2017-11-28 ENCOUNTER — Encounter (HOSPITAL_COMMUNITY): Payer: Self-pay

## 2017-11-28 DIAGNOSIS — Z794 Long term (current) use of insulin: Secondary | ICD-10-CM

## 2017-11-28 DIAGNOSIS — I10 Essential (primary) hypertension: Secondary | ICD-10-CM

## 2017-11-28 DIAGNOSIS — E119 Type 2 diabetes mellitus without complications: Secondary | ICD-10-CM

## 2017-11-28 DIAGNOSIS — R001 Bradycardia, unspecified: Secondary | ICD-10-CM

## 2017-11-28 LAB — RENAL FUNCTION PANEL
Albumin: 2.9 g/dL — ABNORMAL LOW (ref 3.5–5.0)
Anion gap: 11 (ref 5–15)
BUN: 107 mg/dL — AB (ref 8–23)
CHLORIDE: 101 mmol/L (ref 98–111)
CO2: 22 mmol/L (ref 22–32)
CREATININE: 3.93 mg/dL — AB (ref 0.44–1.00)
Calcium: 8.5 mg/dL — ABNORMAL LOW (ref 8.9–10.3)
GFR calc Af Amer: 12 mL/min — ABNORMAL LOW (ref >60)
GFR, EST NON AFRICAN AMERICAN: 11 mL/min — AB (ref >60)
GLUCOSE: 211 mg/dL — AB (ref 70–99)
Phosphorus: 7.7 mg/dL — ABNORMAL HIGH (ref 2.5–4.6)
Potassium: 4.2 mmol/L (ref 3.5–5.1)
Sodium: 134 mmol/L — ABNORMAL LOW (ref 135–145)

## 2017-11-28 LAB — CBC
HCT: 30.1 % — ABNORMAL LOW (ref 36.0–46.0)
Hemoglobin: 9.3 g/dL — ABNORMAL LOW (ref 12.0–15.0)
MCH: 26.7 pg (ref 26.0–34.0)
MCHC: 30.9 g/dL (ref 30.0–36.0)
MCV: 86.5 fL (ref 78.0–100.0)
PLATELETS: 179 10*3/uL (ref 150–400)
RBC: 3.48 MIL/uL — ABNORMAL LOW (ref 3.87–5.11)
RDW: 16.9 % — AB (ref 11.5–15.5)
WBC: 7.2 10*3/uL (ref 4.0–10.5)

## 2017-11-28 LAB — GLUCOSE, CAPILLARY
GLUCOSE-CAPILLARY: 179 mg/dL — AB (ref 70–99)
GLUCOSE-CAPILLARY: 169 mg/dL — AB (ref 70–99)
Glucose-Capillary: 254 mg/dL — ABNORMAL HIGH (ref 70–99)
Glucose-Capillary: 170 mg/dL — ABNORMAL HIGH (ref 70–99)

## 2017-11-28 LAB — IRON AND TIBC
IRON: 16 ug/dL — AB (ref 28–170)
Saturation Ratios: 5 % — ABNORMAL LOW (ref 10.4–31.8)
TIBC: 342 ug/dL (ref 250–450)
UIBC: 326 ug/dL

## 2017-11-28 LAB — FERRITIN: FERRITIN: 28 ng/mL (ref 11–307)

## 2017-11-28 MED ORDER — HEPARIN SODIUM (PORCINE) 5000 UNIT/ML IJ SOLN
5000.0000 [IU] | Freq: Three times a day (TID) | INTRAMUSCULAR | Status: DC
  Administered 2017-12-01 – 2017-12-04 (×10): 5000 [IU] via SUBCUTANEOUS
  Filled 2017-11-28 (×10): qty 1

## 2017-11-28 MED ORDER — CALCIUM ACETATE (PHOS BINDER) 667 MG PO CAPS
667.0000 mg | ORAL_CAPSULE | Freq: Three times a day (TID) | ORAL | Status: DC
  Administered 2017-11-28 – 2017-12-01 (×8): 667 mg via ORAL
  Filled 2017-11-28 (×10): qty 1

## 2017-11-28 MED ORDER — SENNOSIDES-DOCUSATE SODIUM 8.6-50 MG PO TABS
2.0000 | ORAL_TABLET | Freq: Once | ORAL | Status: AC
  Administered 2017-11-28: 2 via ORAL
  Filled 2017-11-28: qty 2

## 2017-11-28 MED ORDER — POLYETHYLENE GLYCOL 3350 17 G PO PACK
17.0000 g | PACK | Freq: Every day | ORAL | Status: DC
  Administered 2017-11-28 – 2017-11-29 (×2): 17 g via ORAL
  Filled 2017-11-28 (×3): qty 1

## 2017-11-28 MED ORDER — SODIUM CHLORIDE 0.9 % IV SOLN
125.0000 mg | Freq: Every day | INTRAVENOUS | Status: AC
  Administered 2017-11-28 – 2017-12-03 (×6): 125 mg via INTRAVENOUS
  Filled 2017-11-28 (×8): qty 10

## 2017-11-28 MED ORDER — PANTOPRAZOLE SODIUM 40 MG PO TBEC
40.0000 mg | DELAYED_RELEASE_TABLET | Freq: Every day | ORAL | Status: DC
  Administered 2017-11-28 – 2017-12-04 (×7): 40 mg via ORAL
  Filled 2017-11-28 (×8): qty 1

## 2017-11-28 MED ORDER — CHLORHEXIDINE GLUCONATE CLOTH 2 % EX PADS
6.0000 | MEDICATED_PAD | Freq: Every day | CUTANEOUS | Status: DC
  Administered 2017-11-28 – 2017-12-04 (×6): 6 via TOPICAL

## 2017-11-28 NOTE — Progress Notes (Signed)
Subjective:   made 2300 of urine but overall only 400 out.  Still overloaded- BUN and crt about the same.  She is accepting but still having a difficult time dealing with all this.  Today constipated - meds ordered   Objective Vital signs in last 24 hours: Vitals:   11/27/17 1910 11/28/17 0012 11/28/17 0332 11/28/17 0521  BP: (!) 124/91 132/61 (!) 132/44 129/68  Pulse: 71 68 68 69  Resp: 18 18 18    Temp: 98.4 F (36.9 C) 98.6 F (37 C) 98.4 F (36.9 C)   TempSrc: Oral Oral Oral   SpO2: 91% 90% 99% 95%  Weight:   86.5 kg   Height:       Weight change: 5.046 kg  Intake/Output Summary (Last 24 hours) at 11/28/2017 8676 Last data filed at 11/28/2017 0430 Gross per 24 hour  Intake 1882.57 ml  Output 2300 ml  Net -417.43 ml    Assessment/ Plan: Pt is a 72 y.o. yo female with CAD, CHF- EF 72 and diastolic HF, DM and advanced CKD who was admitted on 11/26/2017 with  Second hospitalization in one month for volume overload as well as some uremic sxms  Assessment/Plan: 1. CHF- very volume overloaded- is negative but not much on max medical therapy for diuresis.  She is also on low dose hydralazine.  Most efficient tactic for removal would be HD- she is more agreeable 2. CKD-stage 5- s/p AVF on 8/7.  Is likely having uremic sxms at least on admit.  Says symptoms are better and numbers are only slightly worse.  Is not quite emotionally ready for all this- needing a lot of counsel. Now is more accepting to initiate dialysis this admission. Will see if I can get [facility] over weekend- Monday at the latest followed by initiation of HD and CLIP 3. Anemia- hgb drop from 11.6 to 9.3 -  iron stores low, will replete - also on ESA  4. Secondary hyperparathyroidism- phos high- calc low- start phoslo - PTH pending   Alixandrea Milleson A    Labs: Basic Metabolic Panel: Recent Labs  Lab 11/26/17 1331 11/26/17 1352 11/27/17 0614 11/28/17 0457  NA 140 137 136 134*  K 4.2 4.0 4.1 4.2  CL 107 104 102  101  CO2 22  --  24 22  GLUCOSE 98 89 177* 211*  BUN 104* 104* 100* 107*  CREATININE 3.99* 4.10* 3.95* 3.93*  CALCIUM 8.7*  --  8.9 8.5*  PHOS  --   --   --  7.7*   Liver Function Tests: Recent Labs  Lab 11/26/17 1331 11/27/17 0614 11/28/17 0457  AST 34 24  --   ALT 40 34  --   ALKPHOS 107 101  --   BILITOT 0.8 0.7  --   PROT 5.3* 4.9*  --   ALBUMIN 3.0* 2.8* 2.9*   No results for input(s): LIPASE, AMYLASE in the last 168 hours. Recent Labs  Lab 11/26/17 1903  AMMONIA 59*   CBC: Recent Labs  Lab 11/26/17 1331 11/26/17 1352 11/27/17 0614 11/28/17 0457  WBC 7.8  --  7.3 7.2  NEUTROABS 6.0  --   --   --   HGB 10.4* 11.6* 9.3* 9.3*  HCT 33.8* 34.0* 29.9* 30.1*  MCV 88.3  --  85.9 86.5  PLT 180  --  186 179   Cardiac Enzymes: No results for input(s): CKTOTAL, CKMB, CKMBINDEX, TROPONINI in the last 168 hours. CBG: Recent Labs  Lab 11/27/17 0644 11/27/17 0815 11/27/17  1238 11/27/17 1704 11/27/17 2059  GLUCAP 175* 182* 195* 207* 176*    Iron Studies:  Recent Labs    11/28/17 0457  IRON 16*  TIBC 342  FERRITIN 28   Studies/Results: Dg Chest Portable 1 View  Result Date: 11/26/2017 CLINICAL DATA:  Fall. EXAM: PORTABLE CHEST 1 VIEW COMPARISON:  11/03/2017. FINDINGS: Prior CABG. Cardiomegaly with pulmonary venous congestion, bilateral interstitial prominence and right-sided pleural effusion. Findings consistent with CHF. Findings have progressed from prior study of 11/03/2017. IMPRESSION: Prior CABG. Cardiomegaly with bilateral pulmonary infiltrates/edema and right-sided pleural effusion. Findings consistent with CHF. Findings have progressed from prior study of 11/03/2017. Electronically Signed   By: Marcello Moores  Register   On: 11/26/2017 13:55   Medications: Infusions: . sodium chloride    . dextrose 25 mL/hr at 11/28/17 0430  . furosemide 66 mL/hr at 11/28/17 0430    Scheduled Medications: . aspirin EC  81 mg Oral Daily  . atorvastatin  80 mg Oral q1800   . darbepoetin (ARANESP) injection - NON-DIALYSIS  100 mcg Subcutaneous Q Fri-1800  . heparin  5,000 Units Subcutaneous Q8H  . hydrALAZINE  10 mg Oral Q8H  . insulin aspart  0-9 Units Subcutaneous TID WC  . isosorbide dinitrate  10 mg Oral TID  . metolazone  10 mg Oral Daily  . sodium chloride flush  3 mL Intravenous Q12H    have reviewed scheduled and prn medications.  Physical Exam: General: alert, sitting up  Heart: RRR Lungs: dec BS at bases- O2 sat adequate on room air Abdomen: abdominal wall edema Extremities: pitting edema  Dialysis Access: right AVF- patent placed  8/7   11/28/2017,8:29 AM  LOS: 1 day

## 2017-11-28 NOTE — Progress Notes (Signed)
Dr. Moshe Cipro reports that CVC will be  placed Monday, 11/30/2017

## 2017-11-28 NOTE — Progress Notes (Signed)
PROGRESS NOTE        PATIENT DETAILS Name: [name] Age: 72 y.o. Sex: female Date of Birth: 01/19/1946 Admit Date: 11/26/2017 Admitting Physician Reyne Dumas, MD [id number], Gwyndolyn Saxon, MD  Brief Narrative: Patient is a 72 y.o. female with history of CKD stage IV, CAD status post CABG, chronic combined systolic and diastolic heart failure with anasarca in the setting of worsening renal function.  Plans are to initiate high-dose IV diuretics, if no response then probably will require initiation of dialysis.  See below for further details  Subjective: Volume status essentially unchanged-continues to have significant amount of edema.  Nauseous and constipated this morning.  Assessment/Plan: Anasarca in the setting of worsening renal function and decompensated diastolic heart failure: Significantly volume overloaded in spite of being on high-dose diuretics.  Patient now has decided to proceed with hemodialysis.  Nephrology making arrangements for hemodialysis catheter, plans are to tentatively start hemodialysis on Monday.  TTE on 8/23 showed preserved EF.   Acute kidney injury on chronic kidney disease stage IV: Probably has progressed to ESRD-regarding plans to initiate HD next week.  Nephrology following.   Sinus bradycardia: Heart rate has markedly improved following discontinuation of beta-blocker.  Continue to avoid rate control medications in the future.  TSH within normal limits.  Per H&P, patient was given atropine by EMS enroute to the ED.   Acute metabolic encephalopathy: Secondary to uremia-improved somewhat.    Mobile density on the mitral valve seen on TTE on 8/23: Are not consistent with endocarditis at this time, however will obtain blood cultures.  Probably will require TEE sometime next week-we will discuss with cardiology.  CAD status post CABG in 2011: No anginal symptoms-continue with aspirin.   Hypertension: Controlled, continue with  hydralazine and Imdur.  DM-2 with hypoglycemia: No further episodes of hypoglycemia in the past 24 hours, CBGs stable with SSI.    Anemia: Secondary to acute illness and underlying CKD.  No evidence of acute blood loss.  Continue to follow CBC.    History of breast cancer status post radiation and lumpectomy in 2015: Will need to resume anastrozole in the next few days.  DVT Prophylaxis: Prophylactic  Heparin  Code Status: Full code   Family Communication: None at bedside  Disposition Plan: Remain inpatient-will require several more days of hospitalization prior to discharge.  Antimicrobial agents: Anti-infectives (From admission, onward)   None     Procedures: None  CONSULTS:  nephrology  Time spent: 25 minutes-Greater than 50% of this time was spent in counseling, explanation of diagnosis, planning of further management, and coordination of care.  MEDICATIONS: Scheduled Meds: . aspirin EC  81 mg Oral Daily  . atorvastatin  80 mg Oral q1800  . calcium acetate  667 mg Oral TID WC  . Chlorhexidine Gluconate Cloth  6 each Topical Q0600  . darbepoetin (ARANESP) injection - NON-DIALYSIS  100 mcg Subcutaneous Q Fri-1800  . heparin  5,000 Units Subcutaneous Q8H  . [START ON 12/01/2017] heparin injection (subcutaneous)  5,000 Units Subcutaneous Q8H  . hydrALAZINE  10 mg Oral Q8H  . insulin aspart  0-9 Units Subcutaneous TID WC  . isosorbide dinitrate  10 mg Oral TID  . metolazone  10 mg Oral Daily  . sodium chloride flush  3 mL Intravenous Q12H   Continuous Infusions: . sodium chloride    . ferric  gluconate (FERRLECIT/NULECIT) IV 125 mg (11/28/17 0946)  . furosemide 160 mg (11/28/17 0840)   PRN Meds:.sodium chloride, acetaminophen **OR** acetaminophen, dextrose, levalbuterol, MUSCLE RUB, ondansetron **OR** ondansetron (ZOFRAN) IV, sodium chloride flush   PHYSICAL EXAM: Vital signs: Vitals:   11/27/17 1910 11/28/17 0012 11/28/17 0332 11/28/17 0521  BP: (!) 124/91  132/61 (!) 132/44 129/68  Pulse: 71 68 68 69  Resp: 18 18 18    Temp: 98.4 F (36.9 C) 98.6 F (37 C) 98.4 F (36.9 C)   TempSrc: Oral Oral Oral   SpO2: 91% 90% 99% 95%  Weight:   86.5 kg   Height:       Filed Weights   11/26/17 1321 11/27/17 0435 11/28/17 0332  Weight: 81.5 kg 86 kg 86.5 kg   Body mass index is 31.75 kg/m.   General appearance:Awake, alert, not in any distress.  Eyes:no scleral icterus. HEENT: Atraumatic and Normocephalic Neck: supple, no JVD. Resp:Good air entry bilaterally,no rales or rhonchi CVS: S1 S2 regular GI: Bowel sounds present, Non tender and not distended with no gaurding, rigidity or rebound. Extremities: B/L Lower Ext shows 3+ edema, both legs are warm to touch Neurology:  Non focal Psychiatric: Normal judgment and insight. Normal mood. Musculoskeletal:No digital cyanosis Skin:No Rash, warm and dry Wounds:N/A  I have personally reviewed following labs and imaging studies  LABORATORY DATA: CBC: Recent Labs  Lab 11/26/17 1331 11/26/17 1352 11/27/17 0614 11/28/17 0457  WBC 7.8  --  7.3 7.2  NEUTROABS 6.0  --   --   --   HGB 10.4* 11.6* 9.3* 9.3*  HCT 33.8* 34.0* 29.9* 30.1*  MCV 88.3  --  85.9 86.5  PLT 180  --  186 469    Basic Metabolic Panel: Recent Labs  Lab 11/26/17 1331 11/26/17 1352 11/27/17 0614 11/28/17 0457  NA 140 137 136 134*  K 4.2 4.0 4.1 4.2  CL 107 104 102 101  CO2 22  --  24 22  GLUCOSE 98 89 177* 211*  BUN 104* 104* 100* 107*  CREATININE 3.99* 4.10* 3.95* 3.93*  CALCIUM 8.7*  --  8.9 8.5*  MG 2.7*  --   --   --   PHOS  --   --   --  7.7*    GFR: Estimated Creatinine Clearance: 14.3 mL/min (A) (by C-G formula based on SCr of 3.93 mg/dL (H)).  Liver Function Tests: Recent Labs  Lab 11/26/17 1331 11/27/17 0614 11/28/17 0457  AST 34 24  --   ALT 40 34  --   ALKPHOS 107 101  --   BILITOT 0.8 0.7  --   PROT 5.3* 4.9*  --   ALBUMIN 3.0* 2.8* 2.9*   No results for input(s): LIPASE, AMYLASE in  the last 168 hours. Recent Labs  Lab 11/26/17 1903  AMMONIA 59*    Coagulation Profile: Recent Labs  Lab 11/26/17 1331  INR 1.24    Cardiac Enzymes: No results for input(s): CKTOTAL, CKMB, CKMBINDEX, TROPONINI in the last 168 hours.  BNP (last 3 results) No results for input(s): PROBNP in the last 8760 hours.  HbA1C: Recent Labs    11/26/17 1718  HGBA1C 6.1*    CBG: Recent Labs  Lab 11/27/17 0644 11/27/17 0815 11/27/17 1238 11/27/17 1704 11/27/17 2059  GLUCAP 175* 182* 195* 207* 176*    Lipid Profile: No results for input(s): CHOL, HDL, LDLCALC, TRIG, CHOLHDL, LDLDIRECT in the last 72 hours.  Thyroid Function Tests: Recent Labs    11/26/17 1718  TSH 2.307  FREET4 1.39    Anemia Panel: Recent Labs    11/28/17 0457  FERRITIN 28  TIBC 342  IRON 16*    Urine analysis:    Component Value Date/Time   COLORURINE STRAW (A) 11/26/2017 1818   APPEARANCEUR CLEAR 11/26/2017 1818   LABSPEC 1.008 11/26/2017 1818   PHURINE 5.0 11/26/2017 1818   GLUCOSEU NEGATIVE 11/26/2017 1818   HGBUR NEGATIVE 11/26/2017 1818   BILIRUBINUR NEGATIVE 11/26/2017 1818   KETONESUR NEGATIVE 11/26/2017 1818   PROTEINUR 30 (A) 11/26/2017 1818   UROBILINOGEN 0.2 05/30/2009 1500   NITRITE NEGATIVE 11/26/2017 1818   LEUKOCYTESUR NEGATIVE 11/26/2017 1818    Sepsis Labs: Lactic Acid, Venous    Component Value Date/Time   LATICACIDVEN 0.77 11/03/2017 1144    MICROBIOLOGY: No results found for this or any previous visit (from the past 240 hour(s)).  RADIOLOGY STUDIES/RESULTS: Dg Chest 2 View  Result Date: 11/03/2017 CLINICAL DATA:  Follow-up infiltrate EXAM: CHEST - 2 VIEW COMPARISON:  Earlier study of 11/03/2017 FINDINGS: Enlargement of cardiac silhouette post CABG. Atherosclerotic calcification aorta. Mediastinal contours normal. Slight pulmonary vascular congestion. Improved interstitial edema since the earlier study. Persistent RIGHT pleural effusion and basilar  atelectasis. No pneumothorax. Bones demineralized. IMPRESSION: Enlargement of cardiac silhouette post CABG with pulmonary vascular congestion. Slightly improved pulmonary edema. Persistent RIGHT pleural effusion and basilar atelectasis. Electronically Signed   By: Lavonia Dana M.D.   On: 11/03/2017 18:52   Ct Head Wo Contrast  Result Date: 11/03/2017 CLINICAL DATA:  Fall.  Altered mental status. EXAM: CT HEAD WITHOUT CONTRAST TECHNIQUE: Contiguous axial images were obtained from the base of the skull through the vertex without intravenous contrast. COMPARISON:  None. FINDINGS: Brain: There is no evidence of acute infarct, intracranial hemorrhage, mass, midline shift, or extra-axial fluid collection. There is a small left cerebellar infarct with volume loss compatible with a chronic infarct. Bilateral basal ganglia lacunar infarcts are also most likely chronic. Patchy periventricular white matter hypodensities are nonspecific but compatible with mild chronic small vessel ischemic disease. Vascular: Calcified atherosclerosis at the skull base. No hyperdense vessel. Skull: No fracture or suspicious osseous lesion. Moderate-sized right frontal scalp hematoma with underlying prominent vascular channel in the frontal bone. Sinuses/Orbits: Chronic sphenoid sinusitis with sinus expansion, wall thickening and sclerosis, and complex and partially calcified central material in the sinus. Milder mucosal thickening elsewhere in the paranasal sinuses. Clear mastoid air cells. Bilateral cataract extraction. Other: None. IMPRESSION: 1. No evidence of acute intracranial abnormality. 2. Right frontal scalp hematoma.  No skull fracture. 3. Small basal ganglia and cerebellar infarcts, likely chronic. 4. Mild chronic small vessel ischemic white matter disease. 5. Chronic sphenoid sinusitis. These results were called by telephone at the time of interpretation on 11/03/2017 at 9:16 am to Dr. Marda Stalker , who verbally  acknowledged these results. Electronically Signed   By: Logan Bores M.D.   On: 11/03/2017 09:29   Dg Chest Portable 1 View  Result Date: 11/26/2017 CLINICAL DATA:  Fall. EXAM: PORTABLE CHEST 1 VIEW COMPARISON:  11/03/2017. FINDINGS: Prior CABG. Cardiomegaly with pulmonary venous congestion, bilateral interstitial prominence and right-sided pleural effusion. Findings consistent with CHF. Findings have progressed from prior study of 11/03/2017. IMPRESSION: Prior CABG. Cardiomegaly with bilateral pulmonary infiltrates/edema and right-sided pleural effusion. Findings consistent with CHF. Findings have progressed from prior study of 11/03/2017. Electronically Signed   By: Marcello Moores  Register   On: 11/26/2017 13:55   Dg Chest Portable 1 View  Result Date: 11/03/2017 CLINICAL DATA:  Shortness of breath and recent fall, initial encounter EXAM: PORTABLE CHEST 1 VIEW COMPARISON:  None. FINDINGS: Cardiac shadow is enlarged. Aortic calcifications are again seen. The lungs are well aerated bilaterally with patchy infiltrate in the right lung base. No sizable effusion is noted. No bony abnormality is seen. IMPRESSION: Right basilar infiltrate. Followup PA and lateral chest X-ray is recommended in 3-4 weeks following trial of antibiotic therapy to ensure resolution and exclude underlying malignancy. Electronically Signed   By: Inez Catalina M.D.   On: 11/03/2017 08:48     LOS: 1 day   Oren Binet, MD  [facility]  If 7PM-7AM, please contact night-coverage  Please page via [email or web address]-click on MD name and type text message  11/28/2017, 10:23 AM

## 2017-11-28 NOTE — Consult Note (Addendum)
Chief Complaint: Patient was seen in consultation today for tunneled hemodialysis catheter placement Chief Complaint  Patient presents with  . Bradycardia   at the request of Dr Shirl Harris    Supervising Physician: Marybelle Killings  Patient Status: [facility] - In-pt  History of Present Illness: [name] is a 72 y.o. female   CKD 5 R  Arm AVF  placed 8/7-- not yet mature for use Renal MD feels need will be to initiate dialysis this hospital stay Requesting tunneled dialysis catheter placement Monday am ok per notes  Scheduled for tunneled dialysis catheter 8/26 in IR   Past Medical History:  Diagnosis Date  . Allergy   . Arthritis    "maybe in my lower back" (08/27/2012)  . 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 Chronic combined systolic and diastolic CHF (congestive heart failure) ([place])   . CKD (chronic kidney disease), stage IV ([place])   . Coronary artery disease    a. NSTEMI in 05/2009 s/p CABG (LIMA-LAD, SVG-diagonal, SVG-OM1/OM 2).   Marland Kitchen Dyspnea    with exertion  . Family history of anesthesia complication    Mom has a hard time to wake up  . Foot ulcer ([place])    "I've had them on both feet" (08/27/2012)  . Gouty arthritis    "right index finger" (08/27/2012)  . History of radiation therapy 06/09/13-07/06/13   left breast 50Gy  . Hyperlipidemia   . Hypertension   . Infection    right second toe  . Ischemic cardiomyopathy    a. 2011: EF 40-45%. b. EF 55-60% in 04/2017 but shortly after as inpatient was 45-50%.  Marland Kitchen LBBB (left bundle branch block)   . Mitral regurgitation   . Neuropathy    Hx; of B/L feet  . NSTEMI (non-ST elevated myocardial infarction) ([place]) 05/23/2009  . Osteomyelitis of foot ([place])   . PAD (peripheral artery disease) ([facility])    a. LE PAD (patient previously elected hold off L fem-pop), prev followed by Dr. Bridgett Larsson  . PONV (postoperative nausea and vomiting)   . Sinus headache    "occasionally"  (08/27/2012)  . Type II diabetes mellitus ([place])   . Vitamin D deficiency     Past Surgical History:  Procedure Laterality Date  . AMPUTATION Right 08/27/2012   Procedure: AMPUTATION RAY;  Surgeon: Newt Minion, MD;  Location: [place];  Service: Orthopedics;  Laterality: Right;  Right Foot 5th Ray Amputation  . AMPUTATION Left 07/08/2013   Procedure: AMPUTATION RAY;  Surgeon: Newt Minion, MD;  Location: [place];  Service: Orthopedics;  Laterality: Left;  Left Foot 2nd Ray Amputation  . AMPUTATION RAY Right 08/27/2012   5th ray/notes 08/27/2012  . AMPUTATION TOE Right 03/06/2017   Procedure: AMPUTATION TOE, INTERPHANGEAL 2ND RIGHT;  Surgeon: [name], DPM;  Location: [place];  Service: Podiatry;  Laterality: Right;  . AV FISTULA PLACEMENT Right 11/11/2017   Procedure: RIGHT RADIOCEPHALIC  ARTERIOVENOUS FISTULA;  Surgeon: [name], MD;  Location: [place];  Service: Vascular;  Laterality: Right;  . BREAST LUMPECTOMY  04/20/2013   with biopsy      DR WAKEFIELD  . BREAST LUMPECTOMY WITH NEEDLE LOCALIZATION AND AXILLARY SENTINEL LYMPH NODE BX Left 04/20/2013   Procedure: LEFT BREAST WIRE GUIDED LUMPECTOMY AND AXILLARY SENTINEL LYMPH NODE BX;  Surgeon: Rolm Bookbinder, MD;  Location: [place];  Service: General;  Laterality: Left;  . CARDIAC CATHETERIZATION  05/24/2009   /  notes 05/24/2009 (08/27/2012)  . CATARACT EXTRACTION W/ INTRAOCULAR LENS  IMPLANT, BILATERAL  2000's  . COLONOSCOPY W/ BIOPSIES AND POLYPECTOMY  2010  . CORONARY ARTERY BYPASS GRAFT  2011   "CABG X4" (08/27/2012)  . DENTAL SURGERY  04/30/11   "1 implant" (08/27/2012)  . DILATION AND CURETTAGE OF UTERUS    . EYE SURGERY    . FINGER SURGERY Right    "index finger; turned out to be gout" (08/27/2012)    Allergies: Atenolol; Adhesive [tape]; Contrast media [iodinated diagnostic agents]; Latex; and Omnipaque [iohexol]  Medications: Prior to Admission medications   Medication Sig Start Date End Date Taking? Authorizing Provider    acetaminophen (TYLENOL) 500 MG tablet Take 1,000 mg by mouth every 6 (six) hours as needed for mild pain or headache.    Yes [provider]  Acetaminophen-Aspirin Buffered (EXCEDRIN BACK & BODY) 250-250 MG tablet Take 1 tablet by mouth every 6 (six) hours as needed (for back pain).   Yes [provider]  allopurinol (ZYLOPRIM) 100 MG tablet Take 100 mg by mouth 2 (two) times daily.   Yes [provider]  anastrozole (ARIMIDEX) 1 MG tablet Take 1 tablet (1 mg total) by mouth daily. 02/02/17  Yes Magrinat, Virgie Dad, MD  aspirin EC 81 MG tablet Take 1 tablet (81 mg total) by mouth daily. Patient taking differently: Take 81 mg by mouth every evening.  05/18/17  Yes Gerhardt, Marlane Hatcher, NP  atorvastatin (LIPITOR) 80 MG tablet TAKE 1 TABLET DAILY AT 6 PM Patient taking differently: Take 80 mg by mouth daily at 6 PM.  10/26/17  Yes Liane Comber, NP  atorvastatin (LIPITOR) 80 MG tablet Take 80 mg by mouth at bedtime.  10/27/17  Yes [provider]  bumetanide (BUMEX) 2 MG tablet Take 1 tablet (2 mg total) by mouth 2 (two) times daily. 11/05/17  Yes Isabelle Course, MD  carvedilol (COREG) 25 MG tablet TAKE 1 TABLET TWICE DAILY WITH MEALS 10/26/17  Yes Liane Comber, NP  Cholecalciferol (VITAMIN D3) 2000 units TABS Take 4,000 Units by mouth daily.    Yes [provider]  hydrALAZINE (APRESOLINE) 25 MG tablet TAKE 3 TABLETS THREE TIMES DAILY Patient taking differently: Take 75 mg by mouth 3 (three) times daily.  11/18/17  Yes Unk Pinto, MD  insulin NPH-regular Human (NOVOLIN 70/30) (70-30) 100 UNIT/ML injection Inject 10-50 Units into the skin See admin instructions. Inject 50 units into the skin before breakfast and 10-20 units at bedtime   Yes [provider]  isosorbide dinitrate (ISORDIL) 10 MG tablet TAKE 1 TABLET THREE TIMES DAILY 10/26/17  Yes Liane Comber, NP  Multiple Vitamins-Minerals (OCUVITE EYE HEALTH FORMULA PO) Take 1 tablet by mouth at  bedtime.    Yes [provider]  oxyCODONE (ROXICODONE) 5 MG immediate release tablet Take 1 tablet (5 mg total) by mouth every 6 (six) hours as needed for severe pain. 11/11/17  Yes Rhyne, Hulen Shouts, PA-C  Polyethyl Glycol-Propyl Glycol (LUBRICANT EYE DROPS) 0.4-0.3 % SOLN Place 1-2 drops into both eyes 3 (three) times daily as needed (for dry eyes.).   Yes [provider]  sodium chloride (OCEAN) 0.65 % SOLN nasal spray Place 1 spray into both nostrils as needed for congestion.   Yes [provider]  clindamycin (CLEOCIN) 300 MG capsule Take 1 capsule (300 mg total) by mouth 3 (three) times daily. Patient not taking: Reported on 11/26/2017 09/28/17   [name], DPM  docusate sodium (COLACE) 100  MG capsule Take 100 mg by mouth at bedtime.    [provider]  fluticasone (FLONASE) 50 MCG/ACT nasal spray Place 2 sprays into both nostrils daily as needed for allergies or rhinitis.    [provider]  insulin aspart (NOVOLOG) 100 UNIT/ML FlexPen Inject 3 Units into the skin daily. With your largest meal of the day 11/05/17   Isabelle Course, MD  insulin glargine (LANTUS) 100 UNIT/ML injection Inject 0.05 mLs (5 Units total) into the skin daily. 11/06/17   Isabelle Course, MD  NON FORMULARY [employer] Tonic and Homeopathic Remedy: Mix 1 ounce with water and drink one to two times a day (5-6 times a week)    [provider]  Wound Dressings (MEDIHONEY WOUND/BURN DRESSING) GEL Apply to affected are 3 times a week, and cover with sterile dressing. Patient not taking: Reported on 11/26/2017 09/29/17   [name], DPM     Family History  Problem Relation Age of Onset  . Kidney cancer Brother 68  . Hypertension Brother   . Breast cancer Sister 50       LCIS; BRCA negative  . Throat cancer Father 68       smoker  . Breast cancer Sister 23       Lobular breast cancer  . Breast cancer Maternal Aunt        dx in her 65s    Social  History   Socioeconomic History  . Marital status: Married    Spouse name: [name]  . Number of children: 1  . Years of education: Not on file  . Highest education level: Not on file  Occupational History  . Not on file  Social Needs  . Financial resource strain: Not on file  . Food insecurity:    Worry: Not on file    Inability: Not on file  . Transportation needs:    Medical: Not on file    Non-medical: Not on file  Tobacco Use  . Smoking status: Former Research scientist (life sciences)  . Smokeless tobacco: Never Used  Substance and Sexual Activity  . Alcohol use: Yes    Comment: rarely drinks wine  . Drug use: No  . Sexual activity: Not Currently  Lifestyle  . Physical activity:    Days per week: Not on file    Minutes per session: Not on file  . Stress: Not on file  Relationships  . Social connections:    Talks on phone: Not on file    Gets together: Not on file    Attends religious service: Not on file    Active member of club or organization: Not on file    Attends meetings of clubs or organizations: Not on file    Relationship status: Not on file  Other Topics Concern  . Not on file  Social History Narrative   ** Merged History Encounter **         Review of Systems: A 12 point ROS discussed and pertinent positives are indicated in the HPI above.  All other systems are negative.  Review of Systems  Constitutional: Positive for activity change and fatigue. Negative for fever.  Respiratory: Positive for shortness of breath.   Cardiovascular: Negative for chest pain.  Gastrointestinal: Negative for abdominal pain.  Psychiatric/Behavioral: Negative for behavioral problems and confusion.    Vital Signs: BP 129/68 (BP Location: Left Arm)   Pulse 69   Temp 98.4 F (36.9 C) (Oral)   Resp 18  Ht _0  (1.651 m)   Wt 190 lb 12.8 oz (86.5 kg) Comment: scale a  SpO2 95%   BMI 31.75 kg/m   Physical Exam  Constitutional: She is oriented to person, place, and time.  Cardiovascular:  Normal rate, regular rhythm and normal heart sounds.  Pulmonary/Chest: Effort normal. She has wheezes.  Abdominal: Soft. Bowel sounds are normal.  Musculoskeletal: Normal range of motion.  Rt arm AVF - good thrill  Neurological: She is alert and oriented to person, place, and time.  Skin: Skin is warm and dry.  Psychiatric: She has a normal mood and affect. Her behavior is normal. Thought content normal.  Nursing note and vitals reviewed.   Imaging: Dg Chest 2 View  Result Date: 11/03/2017 CLINICAL DATA:  Follow-up infiltrate EXAM: CHEST - 2 VIEW COMPARISON:  Earlier study of 11/03/2017 FINDINGS: Enlargement of cardiac silhouette post CABG. Atherosclerotic calcification aorta. Mediastinal contours normal. Slight pulmonary vascular congestion. Improved interstitial edema since the earlier study. Persistent RIGHT pleural effusion and basilar atelectasis. No pneumothorax. Bones demineralized. IMPRESSION: Enlargement of cardiac silhouette post CABG with pulmonary vascular congestion. Slightly improved pulmonary edema. Persistent RIGHT pleural effusion and basilar atelectasis. Electronically Signed   By: Lavonia Dana M.D.   On: 11/03/2017 18:52   Ct Head Wo Contrast  Result Date: 11/03/2017 CLINICAL DATA:  Fall.  Altered mental status. EXAM: CT HEAD WITHOUT CONTRAST TECHNIQUE: Contiguous axial images were obtained from the base of the skull through the vertex without intravenous contrast. COMPARISON:  None. FINDINGS: Brain: There is no evidence of acute infarct, intracranial hemorrhage, mass, midline shift, or extra-axial fluid collection. There is a small left cerebellar infarct with volume loss compatible with a chronic infarct. Bilateral basal ganglia lacunar infarcts are also most likely chronic. Patchy periventricular white matter hypodensities are nonspecific but compatible with mild chronic small vessel ischemic disease. Vascular: Calcified atherosclerosis at the skull base. No hyperdense vessel.  Skull: No fracture or suspicious osseous lesion. Moderate-sized right frontal scalp hematoma with underlying prominent vascular channel in the frontal bone. Sinuses/Orbits: Chronic sphenoid sinusitis with sinus expansion, wall thickening and sclerosis, and complex and partially calcified central material in the sinus. Milder mucosal thickening elsewhere in the paranasal sinuses. Clear mastoid air cells. Bilateral cataract extraction. Other: None. IMPRESSION: 1. No evidence of acute intracranial abnormality. 2. Right frontal scalp hematoma.  No skull fracture. 3. Small basal ganglia and cerebellar infarcts, likely chronic. 4. Mild chronic small vessel ischemic white matter disease. 5. Chronic sphenoid sinusitis. These results were called by telephone at the time of interpretation on 11/03/2017 at 9:16 am to Dr. Marda Stalker , who verbally acknowledged these results. Electronically Signed   By: Logan Bores M.D.   On: 11/03/2017 09:29   Dg Chest Portable 1 View  Result Date: 11/26/2017 CLINICAL DATA:  Fall. EXAM: PORTABLE CHEST 1 VIEW COMPARISON:  11/03/2017. FINDINGS: Prior CABG. Cardiomegaly with pulmonary venous congestion, bilateral interstitial prominence and right-sided pleural effusion. Findings consistent with CHF. Findings have progressed from prior study of 11/03/2017. IMPRESSION: Prior CABG. Cardiomegaly with bilateral pulmonary infiltrates/edema and right-sided pleural effusion. Findings consistent with CHF. Findings have progressed from prior study of 11/03/2017. Electronically Signed   By: Marcello Moores  Register   On: 11/26/2017 13:55   Dg Chest Portable 1 View  Result Date: 11/03/2017 CLINICAL DATA:  Shortness of breath and recent fall, initial encounter EXAM: PORTABLE CHEST 1 VIEW COMPARISON:  None. FINDINGS: Cardiac shadow is enlarged. Aortic calcifications are again seen. The lungs are well aerated  bilaterally with patchy infiltrate in the right lung base. No sizable effusion is noted. No  bony abnormality is seen. IMPRESSION: Right basilar infiltrate. Followup PA and lateral chest X-ray is recommended in 3-4 weeks following trial of antibiotic therapy to ensure resolution and exclude underlying malignancy. Electronically Signed   By: Inez Catalina M.D.   On: 11/03/2017 08:48    Labs:  CBC: Recent Labs    11/04/17 0511  11/26/17 1331 11/26/17 1352 11/27/17 0614 11/28/17 0457  WBC 9.9  --  7.8  --  7.3 7.2  HGB 9.8*   < > 10.4* 11.6* 9.3* 9.3*  HCT 30.9*   < > 33.8* 34.0* 29.9* 30.1*  PLT 232  --  180  --  186 179   < > = values in this interval not displayed.    COAGS: Recent Labs    11/03/17 0828 11/26/17 1331  INR 1.18 1.24    BMP: Recent Labs    11/05/17 0300  11/26/17 1331 11/26/17 1352 11/27/17 0614 11/28/17 0457  NA 141   < > 140 137 136 134*  K 3.7   < > 4.2 4.0 4.1 4.2  CL 106  --  107 104 102 101  CO2 27  --  22  --  24 22  GLUCOSE 134*   < > 98 89 177* 211*  BUN 81*  --  104* 104* 100* 107*  CALCIUM 9.0  --  8.7*  --  8.9 8.5*  CREATININE 3.18*  --  3.99* 4.10* 3.95* 3.93*  GFRNONAA 14*  --  10*  --  11* 11*  GFRAA 16*  --  12*  --  12* 12*   < > = values in this interval not displayed.    LIVER FUNCTION TESTS: Recent Labs    05/05/17 0455  08/04/17 1738 11/03/17 0828 11/26/17 1331 11/27/17 0614 11/28/17 0457  BILITOT 0.4   < > 0.5 0.7 0.8 0.7  --   AST 21   < > 17 53* 34 24  --   ALT 16   < > 14 78* 40 34  --   ALKPHOS 67  --   --  117 107 101  --   PROT 5.7*   < > 7.1 6.4* 5.3* 4.9*  --   ALBUMIN 3.2*  --   --  3.3* 3.0* 2.8* 2.9*   < > = values in this interval not displayed.    TUMOR MARKERS: No results for input(s): AFPTM, CEA, CA199, CHROMGRNA in the last 8760 hours.  Assessment and Plan:  CKD 5 R arm AVF -- not yet mature To start dialysis this hospital stay per Renal MD Scheduled for tunneled dialysis catheter placement Mon 8/26 in IR Risks and benefits discussed with the patient including, but not limited to  bleeding, infection, vascular injury, pneumothorax which may require chest tube placement, air embolism or even death  All of the patient's questions were answered, patient is agreeable to proceed. Consent signed and in chart.   Thank you for this interesting consult.  I greatly enjoyed meeting [name] and look forward to participating in their care.  A copy of this report was sent to the requesting provider on this date.  Electronically Signed: Lavonia Drafts, PA-C 11/28/2017, 9:35 AM   I spent a total of 20 Minutes    in face to face in clinical consultation, greater than 50% of which was counseling/coordinating care for tunneled dialysis catheter placement

## 2017-11-29 LAB — GLUCOSE, CAPILLARY
GLUCOSE-CAPILLARY: 194 mg/dL — AB (ref 70–99)
Glucose-Capillary: 153 mg/dL — ABNORMAL HIGH (ref 70–99)
Glucose-Capillary: 223 mg/dL — ABNORMAL HIGH (ref 70–99)
Glucose-Capillary: 227 mg/dL — ABNORMAL HIGH (ref 70–99)

## 2017-11-29 LAB — RENAL FUNCTION PANEL
ALBUMIN: 3 g/dL — AB (ref 3.5–5.0)
Anion gap: 14 (ref 5–15)
BUN: 104 mg/dL — ABNORMAL HIGH (ref 8–23)
CALCIUM: 8.9 mg/dL (ref 8.9–10.3)
CO2: 25 mmol/L (ref 22–32)
CREATININE: 4.06 mg/dL — AB (ref 0.44–1.00)
Chloride: 96 mmol/L — ABNORMAL LOW (ref 98–111)
GFR, EST AFRICAN AMERICAN: 12 mL/min — AB (ref >60)
GFR, EST NON AFRICAN AMERICAN: 10 mL/min — AB (ref >60)
Glucose, Bld: 154 mg/dL — ABNORMAL HIGH (ref 70–99)
PHOSPHORUS: 7.4 mg/dL — AB (ref 2.5–4.6)
Potassium: 3.8 mmol/L (ref 3.5–5.1)
SODIUM: 135 mmol/L (ref 135–145)

## 2017-11-29 LAB — PARATHYROID HORMONE, INTACT (NO CA): PTH: 234 pg/mL — ABNORMAL HIGH (ref 15–65)

## 2017-11-29 MED ORDER — POLYETHYLENE GLYCOL 3350 17 G PO PACK
17.0000 g | PACK | Freq: Two times a day (BID) | ORAL | Status: DC
  Administered 2017-11-29 – 2017-12-01 (×3): 17 g via ORAL
  Filled 2017-11-29 (×4): qty 1

## 2017-11-29 NOTE — Progress Notes (Signed)
Subjective:   made 3500 of urine -  overall only 2400 out.  Still overloaded- BUN and crt about the same.  She is accepting now of initiation of HD but still having a difficult time dealing with all this.  TDC will be placed tomorrow followed by her first HD.  Feels better today, still constipated  Objective Vital signs in last 24 hours: Vitals:   11/28/17 1209 11/28/17 2018 11/29/17 0344 11/29/17 0534  BP: (!) 136/44 (!) 138/51 (!) 130/52   Pulse: 68 87 67   Resp: 18 18 18    Temp: 98.5 F (36.9 C) 97.6 F (36.4 C) 98.5 F (36.9 C)   TempSrc: Oral Oral Oral   SpO2: 94% 92% (!) 89%   Weight:  85.8 kg  85.8 kg  Height:       Weight change: -0.771 kg  Intake/Output Summary (Last 24 hours) at 11/29/2017 0915 Last data filed at 11/29/2017 8413 Gross per 24 hour  Intake 693.13 ml  Output 3500 ml  Net -2806.87 ml    Assessment/ Plan: Pt is a 72 y.o. yo female with CAD, CHF- EF 25 and diastolic HF, DM and advanced CKD who was admitted on 11/26/2017 with second hospitalization in one month for volume overload as well as some uremic sxms  Assessment/Plan: 1. CHF- very volume overloaded- is negative on max medical therapy for diuresis.  She is also on low dose hydralazine.  Most efficient tactic for removal would be HD- she is agreeable 2. CKD-stage 5- s/p AVF on 8/7.  Is likely having uremic sxms at least on admit.  Says symptoms are better and numbers are only slightly worse.  Is getting emotionally ready for all this- needing a lot of counsel. Now is more accepting to initiate dialysis this admission. [place] arranged for Monday followed byt first HD- then CLIP  3. Anemia- hgb drop from 11.6 to 9.3 -  iron stores low, will replete - also on ESA  4. Secondary hyperparathyroidism- phos high- calc low- started phoslo - PTH pending   [name] A    Labs: Basic Metabolic Panel: Recent Labs  Lab 11/27/17 0614 11/28/17 0457 11/29/17 0644  NA 136 134* 135  K 4.1 4.2 3.8  CL 102  101 96*  CO2 24 22 25   GLUCOSE 177* 211* 154*  BUN 100* 107* 104*  CREATININE 3.95* 3.93* 4.06*  CALCIUM 8.9 8.5* 8.9  PHOS  --  7.7* 7.4*   Liver Function Tests: Recent Labs  Lab 11/26/17 1331 11/27/17 0614 11/28/17 0457 11/29/17 0644  AST 34 24  --   --   ALT 40 34  --   --   ALKPHOS 107 101  --   --   BILITOT 0.8 0.7  --   --   PROT 5.3* 4.9*  --   --   ALBUMIN 3.0* 2.8* 2.9* 3.0*   No results for input(s): LIPASE, AMYLASE in the last 168 hours. Recent Labs  Lab 11/26/17 1903  AMMONIA 59*   CBC: Recent Labs  Lab 11/26/17 1331 11/26/17 1352 11/27/17 0614 11/28/17 0457  WBC 7.8  --  7.3 7.2  NEUTROABS 6.0  --   --   --   HGB 10.4* 11.6* 9.3* 9.3*  HCT 33.8* 34.0* 29.9* 30.1*  MCV 88.3  --  85.9 86.5  PLT 180  --  186 179   Cardiac Enzymes: No results for input(s): CKTOTAL, CKMB, CKMBINDEX, TROPONINI in the last 168 hours. CBG: Recent Labs  Lab 11/28/17 [phone]  11/28/17 1212 11/28/17 1613 11/28/17 2117 11/29/17 0742  GLUCAP 254* 170* 179* 169* 153*    Iron Studies:  Recent Labs    11/28/17 0457  IRON 16*  TIBC 342  FERRITIN 28   Studies/Results: No results found. Medications: Infusions: . sodium chloride    . ferric gluconate (FERRLECIT/NULECIT) IV 125 mg (11/28/17 0946)  . furosemide 160 mg (11/29/17 0850)    Scheduled Medications: . aspirin EC  81 mg Oral Daily  . atorvastatin  80 mg Oral q1800  . calcium acetate  667 mg Oral TID WC  . Chlorhexidine Gluconate Cloth  6 each Topical Q0600  . darbepoetin (ARANESP) injection - NON-DIALYSIS  100 mcg Subcutaneous Q Fri-1800  . heparin  5,000 Units Subcutaneous Q8H  . [START ON 12/01/2017] heparin injection (subcutaneous)  5,000 Units Subcutaneous Q8H  . hydrALAZINE  10 mg Oral Q8H  . insulin aspart  0-9 Units Subcutaneous TID WC  . isosorbide dinitrate  10 mg Oral TID  . metolazone  10 mg Oral Daily  . pantoprazole  40 mg Oral Q1200  . polyethylene glycol  17 g Oral Daily  . sodium  chloride flush  3 mL Intravenous Q12H    have reviewed scheduled and prn medications.  Physical Exam: General: alert, NAD Heart: RRR Lungs: dec BS at bases- O2 sat adequate on room air Abdomen: abdominal wall edema Extremities: pitting edema  Dialysis Access: right AVF- patent placed  8/7   11/29/2017,9:15 AM  LOS: 2 days

## 2017-11-29 NOTE — Progress Notes (Signed)
PROGRESS NOTE        PATIENT DETAILS Name: [name] Age: 72 y.o. Sex: female Date of Birth: 08-29-1945 Admit Date: 11/26/2017 Admitting Physician Reyne Dumas, MD SWN:[id number], Gwyndolyn Saxon, MD  Brief Narrative: Patient is a 72 y.o. female with history of CKD stage IV, CAD status post CABG, chronic combined systolic and diastolic heart failure with anasarca in the setting of worsening renal function.  Plans are to initiate high-dose IV diuretics, if no response then probably will require initiation of dialysis.  See below for further details  Subjective: Volume status remains the same.  Assessment/Plan: Anasarca in the setting of worsening renal function and decompensated diastolic heart failure: Continues to be volume overloaded in spite of being on high-dose diuretics.  After extensive discussion with patient, plans are to start HD tomorrow, IR to place HD catheter tomorrow morning.   TTE on 8/23 showed preserved EF.   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  Nephrology following.  Sinus bradycardia: Heart rate better following discontinuation of beta-blocker.  Continue to avoid rate control medications.  TSH within normal limits. Per H&P, patient was given atropine by EMS enroute to the ED.   Acute metabolic encephalopathy: Secondary to uremia-improved.  Mobile density on the mitral valve seen on TTE on 8/23: Seen incidentally-patient's overall symptomatology is not consistent with endocarditis at this time, blood cultures on 8/24 pending.  We will touch base with cardiology to see if we can arrange a TEE in the next few days (epic message sent).    CAD status post CABG in 2011: No anginal symptoms-continue with aspirin.   Hypertension: Controlled, continue hydralazine and Imdur.   DM-2 with hypoglycemia: Glycemic episodes in the past 48 hours, CBG stable with  SSI.  Anemia: Secondary to acute illness and underlying CKD.  No evidence of acute blood loss.  Continue IV iron and Aranesp.  Follow CBC periodically.   History of breast cancer status post radiation and lumpectomy in 2015: Will need to resume anastrozole in the next few days.  DVT Prophylaxis: Prophylactic  Heparin  Code Status: Full code   Family Communication: None at bedside  Disposition Plan: Remain inpatient-will require several more days of hospitalization prior to discharge.  Antimicrobial agents: Anti-infectives (From admission, onward)   None     Procedures: None  CONSULTS:  nephrology  Time spent: 25 minutes-Greater than 50% of this time was spent in counseling, explanation of diagnosis, planning of further management, and coordination of care.  MEDICATIONS: Scheduled Meds: . aspirin EC  81 mg Oral Daily  . atorvastatin  80 mg Oral q1800  . calcium acetate  667 mg Oral TID WC  . Chlorhexidine Gluconate Cloth  6 each Topical Q0600  . darbepoetin (ARANESP) injection - NON-DIALYSIS  100 mcg Subcutaneous Q Fri-1800  . heparin  5,000 Units Subcutaneous Q8H  . [START ON 12/01/2017] heparin injection (subcutaneous)  5,000 Units Subcutaneous Q8H  . hydrALAZINE  10 mg Oral Q8H  . insulin aspart  0-9 Units Subcutaneous TID WC  . isosorbide dinitrate  10 mg Oral TID  . metolazone  10 mg Oral Daily  . pantoprazole  40 mg Oral Q1200  . polyethylene glycol  17 g Oral Daily  . sodium chloride flush  3 mL Intravenous Q12H  Continuous Infusions: . sodium chloride    . ferric gluconate (FERRLECIT/NULECIT) IV 125 mg (11/29/17 1124)  . furosemide 160 mg (11/29/17 0850)   PRN Meds:.sodium chloride, acetaminophen **OR** acetaminophen, dextrose, levalbuterol, MUSCLE RUB, ondansetron **OR** ondansetron (ZOFRAN) IV, sodium chloride flush   PHYSICAL EXAM: Vital signs: Vitals:   11/28/17 2018 11/29/17 0344 11/29/17 0534 11/29/17 1259  BP: (!) 138/51 (!) 130/52  (!)  156/58  Pulse: 87 67    Resp: 18 18    Temp: 97.6 F (36.4 C) 98.5 F (36.9 C)    TempSrc: Oral Oral    SpO2: 92% (!) 89%    Weight: 85.8 kg  85.8 kg   Height:       Filed Weights   11/28/17 0332 11/28/17 2018 11/29/17 0534  Weight: 86.5 kg 85.8 kg 85.8 kg   Body mass index is 31.47 kg/m.   General appearance:Awake, alert, not in any distress.  Eyes:no scleral icterus. HEENT: Atraumatic and Normocephalic Neck: supple, no JVD. Resp:Good air entry bilaterally,no rales or rhonchi CVS: S1 S2 regular GI: Bowel sounds present, Non tender and not distended with no gaurding, rigidity or rebound. Extremities: B/L Lower Ext shows 3+ edema, both legs are warm to touch Neurology:  Non focal Psychiatric: Normal judgment and insight. Normal mood. Musculoskeletal:No digital cyanosis Skin:No Rash, warm and dry Wounds:N/A  I have personally reviewed following labs and imaging studies  LABORATORY DATA: CBC: Recent Labs  Lab 11/26/17 1331 11/26/17 1352 11/27/17 0614 11/28/17 0457  WBC 7.8  --  7.3 7.2  NEUTROABS 6.0  --   --   --   HGB 10.4* 11.6* 9.3* 9.3*  HCT 33.8* 34.0* 29.9* 30.1*  MCV 88.3  --  85.9 86.5  PLT 180  --  186 035    Basic Metabolic Panel: Recent Labs  Lab 11/26/17 1331 11/26/17 1352 11/27/17 0614 11/28/17 0457 11/29/17 0644  NA 140 137 136 134* 135  K 4.2 4.0 4.1 4.2 3.8  CL 107 104 102 101 96*  CO2 22  --  24 22 25   GLUCOSE 98 89 177* 211* 154*  BUN 104* 104* 100* 107* 104*  CREATININE 3.99* 4.10* 3.95* 3.93* 4.06*  CALCIUM 8.7*  --  8.9 8.5* 8.9  MG 2.7*  --   --   --   --   PHOS  --   --   --  7.7* 7.4*    GFR: Estimated Creatinine Clearance: 13.7 mL/min (A) (by C-G formula based on SCr of 4.06 mg/dL (H)).  Liver Function Tests: Recent Labs  Lab 11/26/17 1331 11/27/17 0614 11/28/17 0457 11/29/17 0644  AST 34 24  --   --   ALT 40 34  --   --   ALKPHOS 107 101  --   --   BILITOT 0.8 0.7  --   --   PROT 5.3* 4.9*  --   --   ALBUMIN  3.0* 2.8* 2.9* 3.0*   No results for input(s): LIPASE, AMYLASE in the last 168 hours. Recent Labs  Lab 11/26/17 1903  AMMONIA 59*    Coagulation Profile: Recent Labs  Lab 11/26/17 1331  INR 1.24    Cardiac Enzymes: No results for input(s): CKTOTAL, CKMB, CKMBINDEX, TROPONINI in the last 168 hours.  BNP (last 3 results) No results for input(s): PROBNP in the last 8760 hours.  HbA1C: Recent Labs    11/26/17 1718  HGBA1C 6.1*    CBG: Recent Labs  Lab 11/28/17 1212 11/28/17 1613 11/28/17 2117  11/29/17 0742 11/29/17 1258  GLUCAP 170* 179* 169* 153* 223*    Lipid Profile: No results for input(s): CHOL, HDL, LDLCALC, TRIG, CHOLHDL, LDLDIRECT in the last 72 hours.  Thyroid Function Tests: Recent Labs    11/26/17 1718  TSH 2.307  FREET4 1.39    Anemia Panel: Recent Labs    11/28/17 0457  FERRITIN 28  TIBC 342  IRON 16*    Urine analysis:    Component Value Date/Time   COLORURINE STRAW (A) 11/26/2017 1818   APPEARANCEUR CLEAR 11/26/2017 1818   LABSPEC 1.008 11/26/2017 1818   PHURINE 5.0 11/26/2017 1818   GLUCOSEU NEGATIVE 11/26/2017 1818   HGBUR NEGATIVE 11/26/2017 1818   BILIRUBINUR NEGATIVE 11/26/2017 1818   KETONESUR NEGATIVE 11/26/2017 1818   PROTEINUR 30 (A) 11/26/2017 1818   UROBILINOGEN 0.2 05/30/2009 1500   NITRITE NEGATIVE 11/26/2017 1818   LEUKOCYTESUR NEGATIVE 11/26/2017 1818    Sepsis Labs: Lactic Acid, Venous    Component Value Date/Time   LATICACIDVEN 0.77 11/03/2017 1144    MICROBIOLOGY: No results found for this or any previous visit (from the past 240 hour(s)).  RADIOLOGY STUDIES/RESULTS: Dg Chest 2 View  Result Date: 11/03/2017 CLINICAL DATA:  Follow-up infiltrate EXAM: CHEST - 2 VIEW COMPARISON:  Earlier study of 11/03/2017 FINDINGS: Enlargement of cardiac silhouette post CABG. Atherosclerotic calcification aorta. Mediastinal contours normal. Slight pulmonary vascular congestion. Improved interstitial edema since the  earlier study. Persistent RIGHT pleural effusion and basilar atelectasis. No pneumothorax. Bones demineralized. IMPRESSION: Enlargement of cardiac silhouette post CABG with pulmonary vascular congestion. Slightly improved pulmonary edema. Persistent RIGHT pleural effusion and basilar atelectasis. Electronically Signed   By: Lavonia Dana M.D.   On: 11/03/2017 18:52   Ct Head Wo Contrast  Result Date: 11/03/2017 CLINICAL DATA:  Fall.  Altered mental status. EXAM: CT HEAD WITHOUT CONTRAST TECHNIQUE: Contiguous axial images were obtained from the base of the skull through the vertex without intravenous contrast. COMPARISON:  None. FINDINGS: Brain: There is no evidence of acute infarct, intracranial hemorrhage, mass, midline shift, or extra-axial fluid collection. There is a small left cerebellar infarct with volume loss compatible with a chronic infarct. Bilateral basal ganglia lacunar infarcts are also most likely chronic. Patchy periventricular white matter hypodensities are nonspecific but compatible with mild chronic small vessel ischemic disease. Vascular: Calcified atherosclerosis at the skull base. No hyperdense vessel. Skull: No fracture or suspicious osseous lesion. Moderate-sized right frontal scalp hematoma with underlying prominent vascular channel in the frontal bone. Sinuses/Orbits: Chronic sphenoid sinusitis with sinus expansion, wall thickening and sclerosis, and complex and partially calcified central material in the sinus. Milder mucosal thickening elsewhere in the paranasal sinuses. Clear mastoid air cells. Bilateral cataract extraction. Other: None. IMPRESSION: 1. No evidence of acute intracranial abnormality. 2. Right frontal scalp hematoma.  No skull fracture. 3. Small basal ganglia and cerebellar infarcts, likely chronic. 4. Mild chronic small vessel ischemic white matter disease. 5. Chronic sphenoid sinusitis. These results were called by telephone at the time of interpretation on 11/03/2017 at  9:16 am to Dr. Marda Stalker , who verbally acknowledged these results. Electronically Signed   By: Logan Bores M.D.   On: 11/03/2017 09:29   Dg Chest Portable 1 View  Result Date: 11/26/2017 CLINICAL DATA:  Fall. EXAM: PORTABLE CHEST 1 VIEW COMPARISON:  11/03/2017. FINDINGS: Prior CABG. Cardiomegaly with pulmonary venous congestion, bilateral interstitial prominence and right-sided pleural effusion. Findings consistent with CHF. Findings have progressed from prior study of 11/03/2017. IMPRESSION: Prior CABG. Cardiomegaly with bilateral pulmonary  infiltrates/edema and right-sided pleural effusion. Findings consistent with CHF. Findings have progressed from prior study of 11/03/2017. Electronically Signed   By: Marcello Moores  Register   On: 11/26/2017 13:55   Dg Chest Portable 1 View  Result Date: 11/03/2017 CLINICAL DATA:  Shortness of breath and recent fall, initial encounter EXAM: PORTABLE CHEST 1 VIEW COMPARISON:  None. FINDINGS: Cardiac shadow is enlarged. Aortic calcifications are again seen. The lungs are well aerated bilaterally with patchy infiltrate in the right lung base. No sizable effusion is noted. No bony abnormality is seen. IMPRESSION: Right basilar infiltrate. Followup PA and lateral chest X-ray is recommended in 3-4 weeks following trial of antibiotic therapy to ensure resolution and exclude underlying malignancy. Electronically Signed   By: Inez Catalina M.D.   On: 11/03/2017 08:48     LOS: 2 days   Oren Binet, MD  [facility]  If 7PM-7AM, please contact night-coverage  Please page via [email or web address] [email or web address]-click on MD name and type text message  11/29/2017, 1:35 PM

## 2017-11-30 ENCOUNTER — Inpatient Hospital Stay (HOSPITAL_COMMUNITY): Payer: Medicare Other

## 2017-11-30 ENCOUNTER — Encounter (HOSPITAL_COMMUNITY): Payer: Self-pay | Admitting: Interventional Radiology

## 2017-11-30 DIAGNOSIS — E876 Hypokalemia: Secondary | ICD-10-CM

## 2017-11-30 DIAGNOSIS — E1165 Type 2 diabetes mellitus with hyperglycemia: Secondary | ICD-10-CM

## 2017-11-30 DIAGNOSIS — E1149 Type 2 diabetes mellitus with other diabetic neurological complication: Secondary | ICD-10-CM

## 2017-11-30 HISTORY — PX: IR FLUORO GUIDE CV LINE RIGHT: IMG2283

## 2017-11-30 LAB — HEPATITIS B SURFACE ANTIBODY,QUALITATIVE: HEP B S AB: NONREACTIVE

## 2017-11-30 LAB — RENAL FUNCTION PANEL
Albumin: 3.1 g/dL — ABNORMAL LOW (ref 3.5–5.0)
Anion gap: 11 (ref 5–15)
BUN: 106 mg/dL — ABNORMAL HIGH (ref 8–23)
CHLORIDE: 93 mmol/L — AB (ref 98–111)
CO2: 27 mmol/L (ref 22–32)
CREATININE: 4.2 mg/dL — AB (ref 0.44–1.00)
Calcium: 8.6 mg/dL — ABNORMAL LOW (ref 8.9–10.3)
GFR, EST AFRICAN AMERICAN: 11 mL/min — AB (ref >60)
GFR, EST NON AFRICAN AMERICAN: 10 mL/min — AB (ref >60)
Glucose, Bld: 188 mg/dL — ABNORMAL HIGH (ref 70–99)
POTASSIUM: 2.8 mmol/L — AB (ref 3.5–5.1)
Phosphorus: 6.4 mg/dL — ABNORMAL HIGH (ref 2.5–4.6)
Sodium: 131 mmol/L — ABNORMAL LOW (ref 135–145)

## 2017-11-30 LAB — GLUCOSE, CAPILLARY
GLUCOSE-CAPILLARY: 178 mg/dL — AB (ref 70–99)
GLUCOSE-CAPILLARY: 158 mg/dL — AB (ref 70–99)
Glucose-Capillary: 161 mg/dL — ABNORMAL HIGH (ref 70–99)
Glucose-Capillary: 177 mg/dL — ABNORMAL HIGH (ref 70–99)
Glucose-Capillary: 215 mg/dL — ABNORMAL HIGH (ref 70–99)

## 2017-11-30 LAB — CBC
HEMATOCRIT: 31.4 % — AB (ref 36.0–46.0)
Hemoglobin: 9.8 g/dL — ABNORMAL LOW (ref 12.0–15.0)
MCH: 26.3 pg (ref 26.0–34.0)
MCHC: 31.2 g/dL (ref 30.0–36.0)
MCV: 84.4 fL (ref 78.0–100.0)
Platelets: 193 10*3/uL (ref 150–400)
RBC: 3.72 MIL/uL — ABNORMAL LOW (ref 3.87–5.11)
RDW: 16.9 % — AB (ref 11.5–15.5)
WBC: 7.4 10*3/uL (ref 4.0–10.5)

## 2017-11-30 LAB — SURGICAL PCR SCREEN
MRSA, PCR: NEGATIVE
STAPHYLOCOCCUS AUREUS: NEGATIVE

## 2017-11-30 LAB — PROTIME-INR
INR: 1.17
Prothrombin Time: 14.8 seconds (ref 11.4–15.2)

## 2017-11-30 LAB — HEPATITIS B SURFACE ANTIGEN: HEP B S AG: NEGATIVE

## 2017-11-30 MED ORDER — FENTANYL CITRATE (PF) 100 MCG/2ML IJ SOLN
INTRAMUSCULAR | Status: AC
  Filled 2017-11-30: qty 4

## 2017-11-30 MED ORDER — CEFAZOLIN SODIUM-DEXTROSE 2-4 GM/100ML-% IV SOLN
2.0000 g | Freq: Once | INTRAVENOUS | Status: AC
  Administered 2017-11-30: 2 g via INTRAVENOUS

## 2017-11-30 MED ORDER — POTASSIUM CHLORIDE CRYS ER 20 MEQ PO TBCR
40.0000 meq | EXTENDED_RELEASE_TABLET | Freq: Once | ORAL | Status: AC
  Administered 2017-11-30: 40 meq via ORAL
  Filled 2017-11-30: qty 2

## 2017-11-30 MED ORDER — LIDOCAINE HCL 1 % IJ SOLN
INTRAMUSCULAR | Status: AC
  Filled 2017-11-30: qty 20

## 2017-11-30 MED ORDER — MIDAZOLAM HCL 2 MG/2ML IJ SOLN
INTRAMUSCULAR | Status: AC
  Filled 2017-11-30: qty 6

## 2017-11-30 MED ORDER — MIDAZOLAM HCL 2 MG/2ML IJ SOLN
INTRAMUSCULAR | Status: AC | PRN
  Administered 2017-11-30: 1 mg via INTRAVENOUS

## 2017-11-30 MED ORDER — ONDANSETRON HCL 4 MG/2ML IJ SOLN
INTRAMUSCULAR | Status: AC
  Administered 2017-11-30: 4 mg via INTRAVENOUS
  Filled 2017-11-30: qty 2

## 2017-11-30 MED ORDER — HEPARIN SODIUM (PORCINE) 1000 UNIT/ML IJ SOLN
INTRAMUSCULAR | Status: AC
  Administered 2017-11-30: 3.2 mL
  Filled 2017-11-30: qty 1

## 2017-11-30 MED ORDER — SODIUM CHLORIDE 0.9 % IV SOLN
INTRAVENOUS | Status: DC
  Administered 2017-12-01 (×2): via INTRAVENOUS

## 2017-11-30 MED ORDER — FENTANYL CITRATE (PF) 100 MCG/2ML IJ SOLN
INTRAMUSCULAR | Status: AC | PRN
  Administered 2017-11-30 (×2): 50 ug via INTRAVENOUS

## 2017-11-30 MED ORDER — SENNOSIDES-DOCUSATE SODIUM 8.6-50 MG PO TABS
2.0000 | ORAL_TABLET | Freq: Every day | ORAL | Status: DC
  Administered 2017-11-30: 2 via ORAL
  Filled 2017-11-30: qty 2

## 2017-11-30 MED ORDER — CEFAZOLIN SODIUM-DEXTROSE 2-4 GM/100ML-% IV SOLN
INTRAVENOUS | Status: AC
  Administered 2017-11-30: 2 g via INTRAVENOUS
  Filled 2017-11-30: qty 100

## 2017-11-30 MED ORDER — LIDOCAINE HCL (PF) 1 % IJ SOLN
INTRAMUSCULAR | Status: AC | PRN
  Administered 2017-11-30: 15 mL

## 2017-11-30 NOTE — Procedures (Signed)
Interventional Radiology Procedure Note  Procedure: Placement of a right IJ approach tunneled HD catheter.  19cm tip to cuff.  Tip is positioned at the superior cavoatrial junction and catheter is ready for immediate use.  Complications: None Recommendations:  - Ok to shower tomorrow - Do not submerge - Routine line care   Signed,  Dulcy Fanny. Earleen Newport, DO

## 2017-11-30 NOTE — Progress Notes (Signed)
    [facility] has been requested to perform a transesophageal echocardiogram on this patient for mitral valve abnormality - highly mobile 3 x 11 mm linear echodensity in the left ventricle that is probably attached to the mitral chordae tendinae. After careful review of history and examination, the risks and benefits of transesophageal echocardiogram have been explained including risks of esophageal damage, perforation (1:10,000 risk), bleeding, pharyngeal hematoma as well as other potential complications associated with conscious sedation including aspiration, arrhythmia, respiratory failure and death. Alternatives to treatment were discussed, questions were answered. Patient is willing to proceed. She was accompanied by her husband today. She is mentating normally and able to consent.   Patient did notify me that she has a particularly strong gag reflex. I spoke with endoscopy team who said it's typically not an issue with the spray and sedation - they asked me to make patient aware to notify nurse when she goes for procedure tomorrow. I relayed this to the patient/husband.  [name] Pitter, PA-C 11/30/2017 3:18 PM

## 2017-11-30 NOTE — Sedation Documentation (Signed)
Patient is resting comfortably. 

## 2017-11-30 NOTE — Sedation Documentation (Signed)
Sats post-procedure 88-93 depending on stimulation, will transfer pt with 02 2L on. Will make RN aware

## 2017-11-30 NOTE — Procedures (Signed)
Patient seen on Hemodialysis. QB 250, UF goal 2L Treatment adjusted as needed.  Elmarie Shiley MD [facility]. Office # [phone] Pager # [phone] 4:48 PM

## 2017-11-30 NOTE — Progress Notes (Signed)
Patient is back on the unit, she is very sleepy but responds to voice and can answer my questions, husband with her at bedside.

## 2017-11-30 NOTE — Progress Notes (Signed)
PROGRESS NOTE        PATIENT DETAILS Name: [name] Age: 72 y.o. Sex: female Date of Birth: Jan 19, 1946 Admit Date: 11/26/2017 Admitting Physician Reyne Dumas, MD RFF:[id number], Gwyndolyn Saxon, MD  Brief Narrative: Patient is a 72 y.o. female with history of CKD stage IV, CAD status post CABG, chronic combined systolic and diastolic heart failure with anasarca in the setting of worsening renal function.  Plans are to initiate high-dose IV diuretics, if no response then probably will require initiation of dialysis.  See below for further details  Subjective: Status remains the same-has not had a bowel movement in spite of starting MiraLAX yesterday.  Assessment/Plan: Anasarca in the setting of worsening renal function and decompensated diastolic heart failure: Continues to be volume overloaded in spite of being on high-dose diuretics, after extensive discussion with family, nephrology plans to start HD today.  IR to place HD catheter today as well. TTE on 8/23 showed preserved EF.   Acute kidney injury on chronic kidney disease stage IV now likely progressed to ESRD: See above regarding plans to initiate HD, nephrology following.    Sinus bradycardia: Heart rate much better after discontinuation of beta-blocker.  Continue to avoid rate control medications.  TSH within normal limits. Per H&P, patient was given atropine by EMS enroute to the ED.   Acute metabolic encephalopathy: Secondary to uremia, improved.  Mobile density on the mitral valve seen on TTE on 8/23: Seen incidentally-patient's overall symptomatology is not consistent with endocarditis at this time, blood cultures on 8/24 neg. Spoke with cardiology, patient is scheduled for a TEE on 8/27.  Hypokalemia: Secondary to diuretics, replete and recheck.  Constipation: Continue MiraLAX-add senna and follow  CAD status post CABG in 2011: No anginal symptoms, continue with aspirin.  Hypertension: Blood  pressure fluctuating-mostly on the higher side-Continue with hydralazine and Imdur, suspect will improve with initiation of dialysis.    DM-2 with hypoglycemia: No further hypoglycemic episodes in the past 70 hours, CBG stable with SSI.  Anemia: Secondary to acute illness and underlying CKD.  No evidence of acute blood loss.  Continue IV iron and Aranesp.  Follow CBC periodically.   History of breast cancer status post radiation and lumpectomy in 2015: Will need to resume anastrozole in the next few days.  DVT Prophylaxis: Prophylactic  Heparin  Code Status: Full code   Family Communication: None at bedside  Disposition Plan: Remain inpatient-will require several more days of hospitalization prior to discharge.  Antimicrobial agents: Anti-infectives (From admission, onward)   Start     Dose/Rate Route Frequency Ordered Stop   11/30/17 0915  ceFAZolin (ANCEF) IVPB 2g/100 mL premix     2 g 200 mL/hr over 30 Minutes Intravenous  Once 11/30/17 0900 11/30/17 1121     Procedures: None  CONSULTS:  nephrology  Time spent: 25 minutes-Greater than 50% of this time was spent in counseling, explanation of diagnosis, planning of further management, and coordination of care.  MEDICATIONS: Scheduled Meds: . aspirin EC  81 mg Oral Daily  . atorvastatin  80 mg Oral q1800  . calcium acetate  667 mg Oral TID WC  . Chlorhexidine Gluconate Cloth  6 each Topical Q0600  . darbepoetin (ARANESP) injection - NON-DIALYSIS  100 mcg Subcutaneous Q Fri-1800  . fentaNYL      . [START ON 12/01/2017] heparin injection (subcutaneous)  5,000  Units Subcutaneous Q8H  . hydrALAZINE  10 mg Oral Q8H  . insulin aspart  0-9 Units Subcutaneous TID WC  . isosorbide dinitrate  10 mg Oral TID  . lidocaine      . metolazone  10 mg Oral Daily  . midazolam      . pantoprazole  40 mg Oral Q1200  . polyethylene glycol  17 g Oral BID  . sodium chloride flush  3 mL Intravenous Q12H   Continuous Infusions: .  sodium chloride    . ferric gluconate (FERRLECIT/NULECIT) IV 125 mg (11/30/17 0849)  . furosemide 160 mg (11/30/17 0449)   PRN Meds:.sodium chloride, acetaminophen **OR** acetaminophen, dextrose, levalbuterol, MUSCLE RUB, ondansetron **OR** ondansetron (ZOFRAN) IV, sodium chloride flush   PHYSICAL EXAM: Vital signs: Vitals:   11/30/17 1058 11/30/17 1101 11/30/17 1104 11/30/17 1111  BP: (!) 180/73 (!) 167/59 (!) 157/57 (!) 148/60  Pulse: 75 70 68 70  Resp: 16 15 15 20   Temp:      TempSrc:      SpO2: 98% 94% 96% 90%  Weight:      Height:       Filed Weights   11/28/17 2018 11/29/17 0534 11/30/17 0501  Weight: 85.8 kg 85.8 kg 83.4 kg   Body mass index is 30.59 kg/m.   General appearance:Awake, alert, not in any distress.  Eyes:no scleral icterus. HEENT: Atraumatic and Normocephalic Neck: supple, no JVD. Resp:Good air entry bilaterally,no rales or rhonchi CVS: S1 S2 regular, no murmurs.  GI: Bowel sounds present, Non tender and not distended with no gaurding, rigidity or rebound. Extremities: B/L Lower Ext shows 3+ edema, both legs are warm to touch Neurology:  Non focal Psychiatric: Normal judgment and insight. Normal mood. Musculoskeletal:No digital cyanosis Skin:No Rash, warm and dry Wounds:N/A  I have personally reviewed following labs and imaging studies  LABORATORY DATA: CBC: Recent Labs  Lab 11/26/17 1331 11/26/17 1352 11/27/17 0614 11/28/17 0457 11/30/17 0544  WBC 7.8  --  7.3 7.2 7.4  NEUTROABS 6.0  --   --   --   --   HGB 10.4* 11.6* 9.3* 9.3* 9.8*  HCT 33.8* 34.0* 29.9* 30.1* 31.4*  MCV 88.3  --  85.9 86.5 84.4  PLT 180  --  186 179 536    Basic Metabolic Panel: Recent Labs  Lab 11/26/17 1331 11/26/17 1352 11/27/17 0614 11/28/17 0457 11/29/17 0644 11/30/17 0544  NA 140 137 136 134* 135 131*  K 4.2 4.0 4.1 4.2 3.8 2.8*  CL 107 104 102 101 96* 93*  CO2 22  --  24 22 25 27   GLUCOSE 98 89 177* 211* 154* 188*  BUN 104* 104* 100* 107* 104*  106*  CREATININE 3.99* 4.10* 3.95* 3.93* 4.06* 4.20*  CALCIUM 8.7*  --  8.9 8.5* 8.9 8.6*  MG 2.7*  --   --   --   --   --   PHOS  --   --   --  7.7* 7.4* 6.4*    GFR: Estimated Creatinine Clearance: 13.1 mL/min (A) (by C-G formula based on SCr of 4.2 mg/dL (H)).  Liver Function Tests: Recent Labs  Lab 11/26/17 1331 11/27/17 0614 11/28/17 0457 11/29/17 0644 11/30/17 0544  AST 34 24  --   --   --   ALT 40 34  --   --   --   ALKPHOS 107 101  --   --   --   BILITOT 0.8 0.7  --   --   --  PROT 5.3* 4.9*  --   --   --   ALBUMIN 3.0* 2.8* 2.9* 3.0* 3.1*   No results for input(s): LIPASE, AMYLASE in the last 168 hours. Recent Labs  Lab 11/26/17 1903  AMMONIA 59*    Coagulation Profile: Recent Labs  Lab 11/26/17 1331 11/30/17 0544  INR 1.24 1.17    Cardiac Enzymes: No results for input(s): CKTOTAL, CKMB, CKMBINDEX, TROPONINI in the last 168 hours.  BNP (last 3 results) No results for input(s): PROBNP in the last 8760 hours.  HbA1C: No results for input(s): HGBA1C in the last 72 hours.  CBG: Recent Labs  Lab 11/29/17 1258 11/29/17 1645 11/29/17 2107 11/30/17 0224 11/30/17 0739  GLUCAP 223* 227* 194* 178* 158*    Lipid Profile: No results for input(s): CHOL, HDL, LDLCALC, TRIG, CHOLHDL, LDLDIRECT in the last 72 hours.  Thyroid Function Tests: No results for input(s): TSH, T4TOTAL, FREET4, T3FREE, THYROIDAB in the last 72 hours.  Anemia Panel: Recent Labs    11/28/17 0457  FERRITIN 28  TIBC 342  IRON 16*    Urine analysis:    Component Value Date/Time   COLORURINE STRAW (A) 11/26/2017 1818   APPEARANCEUR CLEAR 11/26/2017 1818   LABSPEC 1.008 11/26/2017 1818   PHURINE 5.0 11/26/2017 1818   GLUCOSEU NEGATIVE 11/26/2017 1818   HGBUR NEGATIVE 11/26/2017 1818   BILIRUBINUR NEGATIVE 11/26/2017 1818   KETONESUR NEGATIVE 11/26/2017 1818   PROTEINUR 30 (A) 11/26/2017 1818   UROBILINOGEN 0.2 05/30/2009 1500   NITRITE NEGATIVE 11/26/2017 1818    LEUKOCYTESUR NEGATIVE 11/26/2017 1818    Sepsis Labs: Lactic Acid, Venous    Component Value Date/Time   LATICACIDVEN 0.77 11/03/2017 1144    MICROBIOLOGY: Recent Results (from the past 240 hour(s))  Culture, blood (routine x 2)     Status: None (Preliminary result)   Collection Time: 11/28/17 10:46 AM  Result Value Ref Range Status   Specimen Description BLOOD LEFT ARM  Final   Special Requests   Final    BOTTLES DRAWN AEROBIC AND ANAEROBIC Blood Culture adequate volume   Culture   Final    NO GROWTH 2 DAYS Performed at [facility], [place] [address], [place], [place] [address]    Report Status PENDING  Incomplete  Culture, blood (routine x 2)     Status: None (Preliminary result)   Collection Time: 11/28/17 10:54 AM  Result Value Ref Range Status   Specimen Description BLOOD LEFT HAND  Final   Special Requests   Final    BOTTLES DRAWN AEROBIC AND ANAEROBIC Blood Culture adequate volume   Culture   Final    NO GROWTH 2 DAYS Performed at [facility], [place] [address], [place], [place] [address]    Report Status PENDING  Incomplete  Surgical pcr screen     Status: None   Collection Time: 11/30/17 12:27 AM  Result Value Ref Range Status   MRSA, PCR NEGATIVE NEGATIVE Final   Staphylococcus aureus NEGATIVE NEGATIVE Final    Comment: (NOTE) The Xpert SA Assay (FDA approved for NASAL specimens in patients 31 years of age and older), is one component of a comprehensive surveillance program. It is not intended to diagnose infection nor to guide or monitor treatment. Performed at [facility], [place] [address] [place], [place] [address]     RADIOLOGY STUDIES/RESULTS: Dg Chest 2 View  Result Date: 11/03/2017 CLINICAL DATA:  Follow-up infiltrate EXAM: CHEST - 2 VIEW COMPARISON:  Earlier study of  11/03/2017 FINDINGS: Enlargement of cardiac silhouette post CABG. Atherosclerotic calcification aorta. Mediastinal contours normal. Slight pulmonary vascular congestion.  Improved interstitial edema since the earlier study. Persistent RIGHT pleural effusion and basilar atelectasis. No pneumothorax. Bones demineralized. IMPRESSION: Enlargement of cardiac silhouette post CABG with pulmonary vascular congestion. Slightly improved pulmonary edema. Persistent RIGHT pleural effusion and basilar atelectasis. Electronically Signed   By: Lavonia Dana M.D.   On: 11/03/2017 18:52   Ct Head Wo Contrast  Result Date: 11/03/2017 CLINICAL DATA:  Fall.  Altered mental status. EXAM: CT HEAD WITHOUT CONTRAST TECHNIQUE: Contiguous axial images were obtained from the base of the skull through the vertex without intravenous contrast. COMPARISON:  None. FINDINGS: Brain: There is no evidence of acute infarct, intracranial hemorrhage, mass, midline shift, or extra-axial fluid collection. There is a small left cerebellar infarct with volume loss compatible with a chronic infarct. Bilateral basal ganglia lacunar infarcts are also most likely chronic. Patchy periventricular white matter hypodensities are nonspecific but compatible with mild chronic small vessel ischemic disease. Vascular: Calcified atherosclerosis at the skull base. No hyperdense vessel. Skull: No fracture or suspicious osseous lesion. Moderate-sized right frontal scalp hematoma with underlying prominent vascular channel in the frontal bone. Sinuses/Orbits: Chronic sphenoid sinusitis with sinus expansion, wall thickening and sclerosis, and complex and partially calcified central material in the sinus. Milder mucosal thickening elsewhere in the paranasal sinuses. Clear mastoid air cells. Bilateral cataract extraction. Other: None. IMPRESSION: 1. No evidence of acute intracranial abnormality. 2. Right frontal scalp hematoma.  No skull fracture. 3. Small basal ganglia and cerebellar infarcts, likely chronic. 4. Mild chronic small vessel ischemic white matter disease. 5. Chronic sphenoid sinusitis. These results were called by telephone at the  time of interpretation on 11/03/2017 at 9:16 am to Dr. Marda Stalker , who verbally acknowledged these results. Electronically Signed   By: Logan Bores M.D.   On: 11/03/2017 09:29   Dg Chest Portable 1 View  Result Date: 11/26/2017 CLINICAL DATA:  Fall. EXAM: PORTABLE CHEST 1 VIEW COMPARISON:  11/03/2017. FINDINGS: Prior CABG. Cardiomegaly with pulmonary venous congestion, bilateral interstitial prominence and right-sided pleural effusion. Findings consistent with CHF. Findings have progressed from prior study of 11/03/2017. IMPRESSION: Prior CABG. Cardiomegaly with bilateral pulmonary infiltrates/edema and right-sided pleural effusion. Findings consistent with CHF. Findings have progressed from prior study of 11/03/2017. Electronically Signed   By: Marcello Moores  Register   On: 11/26/2017 13:55   Dg Chest Portable 1 View  Result Date: 11/03/2017 CLINICAL DATA:  Shortness of breath and recent fall, initial encounter EXAM: PORTABLE CHEST 1 VIEW COMPARISON:  None. FINDINGS: Cardiac shadow is enlarged. Aortic calcifications are again seen. The lungs are well aerated bilaterally with patchy infiltrate in the right lung base. No sizable effusion is noted. No bony abnormality is seen. IMPRESSION: Right basilar infiltrate. Followup PA and lateral chest X-ray is recommended in 3-4 weeks following trial of antibiotic therapy to ensure resolution and exclude underlying malignancy. Electronically Signed   By: Inez Catalina M.D.   On: 11/03/2017 08:48     LOS: 3 days   Oren Binet, MD  [facility]  If 7PM-7AM, please contact night-coverage  Please page via [email or web address] [email or web address]-click on MD name and type text message  11/30/2017, 11:39 AM

## 2017-11-30 NOTE — Consult Note (Signed)
   [facility] Inpatient Consult   11/30/2017  [name] [facility] 20-Sep-1945 [id number]   Patient was assessed for high risk and for [facility] Care Management for community services. Patient was previously active with [name] with EMMI HF follow up and Telephonic nurses with Medicare insurance.  Met with patient at bedside and she had just started with her lunch.  Will follow up for needs.  Of note, [facility] Care Management services does not replace or interfere with any services that are arranged by inpatient case management or social work. For additional questions or referrals please contact:   1530 Came back by to speak with patient and she was going to radiology.  [name], RN BSN [facility] Liaison  [phone] business mobile phone Toll free office [phone]

## 2017-11-30 NOTE — H&P (View-Only) (Signed)
PROGRESS NOTE        PATIENT DETAILS Name: [name] Age: 72 y.o. Sex: female Date of Birth: 15-Feb-1946 Admit Date: 11/26/2017 Admitting Physician Reyne Dumas, MD MAU:[id number], Gwyndolyn Saxon, MD  Brief Narrative: Patient is a 72 y.o. female with history of CKD stage IV, CAD status post CABG, chronic combined systolic and diastolic heart failure with anasarca in the setting of worsening renal function.  Plans are to initiate high-dose IV diuretics, if no response then probably will require initiation of dialysis.  See below for further details  Subjective: Status remains the same-has not had a bowel movement in spite of starting MiraLAX yesterday.  Assessment/Plan: Anasarca in the setting of worsening renal function and decompensated diastolic heart failure: Continues to be volume overloaded in spite of being on high-dose diuretics, after extensive discussion with family, nephrology plans to start HD today.  IR to place HD catheter today as well. TTE on 8/23 showed preserved EF.   Acute kidney injury on chronic kidney disease stage IV now likely progressed to ESRD: See above regarding plans to initiate HD, nephrology following.    Sinus bradycardia: Heart rate much better after discontinuation of beta-blocker.  Continue to avoid rate control medications.  TSH within normal limits. Per H&P, patient was given atropine by EMS enroute to the ED.   Acute metabolic encephalopathy: Secondary to uremia, improved.  Mobile density on the mitral valve seen on TTE on 8/23: Seen incidentally-patient's overall symptomatology is not consistent with endocarditis at this time, blood cultures on 8/24 neg. Spoke with cardiology, patient is scheduled for a TEE on 8/27.  Hypokalemia: Secondary to diuretics, replete and recheck.  Constipation: Continue MiraLAX-add senna and follow  CAD status post CABG in 2011: No anginal symptoms, continue with aspirin.  Hypertension: Blood  pressure fluctuating-mostly on the higher side-Continue with hydralazine and Imdur, suspect will improve with initiation of dialysis.    DM-2 with hypoglycemia: No further hypoglycemic episodes in the past 70 hours, CBG stable with SSI.  Anemia: Secondary to acute illness and underlying CKD.  No evidence of acute blood loss.  Continue IV iron and Aranesp.  Follow CBC periodically.   History of breast cancer status post radiation and lumpectomy in 2015: Will need to resume anastrozole in the next few days.  DVT Prophylaxis: Prophylactic  Heparin  Code Status: Full code   Family Communication: None at bedside  Disposition Plan: Remain inpatient-will require several more days of hospitalization prior to discharge.  Antimicrobial agents: Anti-infectives (From admission, onward)   Start     Dose/Rate Route Frequency Ordered Stop   11/30/17 0915  ceFAZolin (ANCEF) IVPB 2g/100 mL premix     2 g 200 mL/hr over 30 Minutes Intravenous  Once 11/30/17 0900 11/30/17 1121     Procedures: None  CONSULTS:  nephrology  Time spent: 25 minutes-Greater than 50% of this time was spent in counseling, explanation of diagnosis, planning of further management, and coordination of care.  MEDICATIONS: Scheduled Meds: . aspirin EC  81 mg Oral Daily  . atorvastatin  80 mg Oral q1800  . calcium acetate  667 mg Oral TID WC  . Chlorhexidine Gluconate Cloth  6 each Topical Q0600  . darbepoetin (ARANESP) injection - NON-DIALYSIS  100 mcg Subcutaneous Q Fri-1800  . fentaNYL      . [START ON 12/01/2017] heparin injection (subcutaneous)  5,000  Units Subcutaneous Q8H  . hydrALAZINE  10 mg Oral Q8H  . insulin aspart  0-9 Units Subcutaneous TID WC  . isosorbide dinitrate  10 mg Oral TID  . lidocaine      . metolazone  10 mg Oral Daily  . midazolam      . pantoprazole  40 mg Oral Q1200  . polyethylene glycol  17 g Oral BID  . sodium chloride flush  3 mL Intravenous Q12H   Continuous Infusions: .  sodium chloride    . ferric gluconate (FERRLECIT/NULECIT) IV 125 mg (11/30/17 0849)  . furosemide 160 mg (11/30/17 0449)   PRN Meds:.sodium chloride, acetaminophen **OR** acetaminophen, dextrose, levalbuterol, MUSCLE RUB, ondansetron **OR** ondansetron (ZOFRAN) IV, sodium chloride flush   PHYSICAL EXAM: Vital signs: Vitals:   11/30/17 1058 11/30/17 1101 11/30/17 1104 11/30/17 1111  BP: (!) 180/73 (!) 167/59 (!) 157/57 (!) 148/60  Pulse: 75 70 68 70  Resp: 16 15 15 20   Temp:      TempSrc:      SpO2: 98% 94% 96% 90%  Weight:      Height:       Filed Weights   11/28/17 2018 11/29/17 0534 11/30/17 0501  Weight: 85.8 kg 85.8 kg 83.4 kg   Body mass index is 30.59 kg/m.   General appearance:Awake, alert, not in any distress.  Eyes:no scleral icterus. HEENT: Atraumatic and Normocephalic Neck: supple, no JVD. Resp:Good air entry bilaterally,no rales or rhonchi CVS: S1 S2 regular, no murmurs.  GI: Bowel sounds present, Non tender and not distended with no gaurding, rigidity or rebound. Extremities: B/L Lower Ext shows 3+ edema, both legs are warm to touch Neurology:  Non focal Psychiatric: Normal judgment and insight. Normal mood. Musculoskeletal:No digital cyanosis Skin:No Rash, warm and dry Wounds:N/A  I have personally reviewed following labs and imaging studies  LABORATORY DATA: CBC: Recent Labs  Lab 11/26/17 1331 11/26/17 1352 11/27/17 0614 11/28/17 0457 11/30/17 0544  WBC 7.8  --  7.3 7.2 7.4  NEUTROABS 6.0  --   --   --   --   HGB 10.4* 11.6* 9.3* 9.3* 9.8*  HCT 33.8* 34.0* 29.9* 30.1* 31.4*  MCV 88.3  --  85.9 86.5 84.4  PLT 180  --  186 179 321    Basic Metabolic Panel: Recent Labs  Lab 11/26/17 1331 11/26/17 1352 11/27/17 0614 11/28/17 0457 11/29/17 0644 11/30/17 0544  NA 140 137 136 134* 135 131*  K 4.2 4.0 4.1 4.2 3.8 2.8*  CL 107 104 102 101 96* 93*  CO2 22  --  24 22 25 27   GLUCOSE 98 89 177* 211* 154* 188*  BUN 104* 104* 100* 107* 104*  106*  CREATININE 3.99* 4.10* 3.95* 3.93* 4.06* 4.20*  CALCIUM 8.7*  --  8.9 8.5* 8.9 8.6*  MG 2.7*  --   --   --   --   --   PHOS  --   --   --  7.7* 7.4* 6.4*    GFR: Estimated Creatinine Clearance: 13.1 mL/min (A) (by C-G formula based on SCr of 4.2 mg/dL (H)).  Liver Function Tests: Recent Labs  Lab 11/26/17 1331 11/27/17 0614 11/28/17 0457 11/29/17 0644 11/30/17 0544  AST 34 24  --   --   --   ALT 40 34  --   --   --   ALKPHOS 107 101  --   --   --   BILITOT 0.8 0.7  --   --   --  PROT 5.3* 4.9*  --   --   --   ALBUMIN 3.0* 2.8* 2.9* 3.0* 3.1*   No results for input(s): LIPASE, AMYLASE in the last 168 hours. Recent Labs  Lab 11/26/17 1903  AMMONIA 59*    Coagulation Profile: Recent Labs  Lab 11/26/17 1331 11/30/17 0544  INR 1.24 1.17    Cardiac Enzymes: No results for input(s): CKTOTAL, CKMB, CKMBINDEX, TROPONINI in the last 168 hours.  BNP (last 3 results) No results for input(s): PROBNP in the last 8760 hours.  HbA1C: No results for input(s): HGBA1C in the last 72 hours.  CBG: Recent Labs  Lab 11/29/17 1258 11/29/17 1645 11/29/17 2107 11/30/17 0224 11/30/17 0739  GLUCAP 223* 227* 194* 178* 158*    Lipid Profile: No results for input(s): CHOL, HDL, LDLCALC, TRIG, CHOLHDL, LDLDIRECT in the last 72 hours.  Thyroid Function Tests: No results for input(s): TSH, T4TOTAL, FREET4, T3FREE, THYROIDAB in the last 72 hours.  Anemia Panel: Recent Labs    11/28/17 0457  FERRITIN 28  TIBC 342  IRON 16*    Urine analysis:    Component Value Date/Time   COLORURINE STRAW (A) 11/26/2017 1818   APPEARANCEUR CLEAR 11/26/2017 1818   LABSPEC 1.008 11/26/2017 1818   PHURINE 5.0 11/26/2017 1818   GLUCOSEU NEGATIVE 11/26/2017 1818   HGBUR NEGATIVE 11/26/2017 1818   BILIRUBINUR NEGATIVE 11/26/2017 1818   KETONESUR NEGATIVE 11/26/2017 1818   PROTEINUR 30 (A) 11/26/2017 1818   UROBILINOGEN 0.2 05/30/2009 1500   NITRITE NEGATIVE 11/26/2017 1818    LEUKOCYTESUR NEGATIVE 11/26/2017 1818    Sepsis Labs: Lactic Acid, Venous    Component Value Date/Time   LATICACIDVEN 0.77 11/03/2017 1144    MICROBIOLOGY: Recent Results (from the past 240 hour(s))  Culture, blood (routine x 2)     Status: None (Preliminary result)   Collection Time: 11/28/17 10:46 AM  Result Value Ref Range Status   Specimen Description BLOOD LEFT ARM  Final   Special Requests   Final    BOTTLES DRAWN AEROBIC AND ANAEROBIC Blood Culture adequate volume   Culture   Final    NO GROWTH 2 DAYS Performed at [facility], [place] [address] [place], [place] [address]    Report Status PENDING  Incomplete  Culture, blood (routine x 2)     Status: None (Preliminary result)   Collection Time: 11/28/17 10:54 AM  Result Value Ref Range Status   Specimen Description BLOOD LEFT HAND  Final   Special Requests   Final    BOTTLES DRAWN AEROBIC AND ANAEROBIC Blood Culture adequate volume   Culture   Final    NO GROWTH 2 DAYS Performed at [facility], [place] [address], [place], [place] [address]    Report Status PENDING  Incomplete  Surgical pcr screen     Status: None   Collection Time: 11/30/17 12:27 AM  Result Value Ref Range Status   MRSA, PCR NEGATIVE NEGATIVE Final   Staphylococcus aureus NEGATIVE NEGATIVE Final    Comment: (NOTE) The Xpert SA Assay (FDA approved for NASAL specimens in patients 30 years of age and older), is one component of a comprehensive surveillance program. It is not intended to diagnose infection nor to guide or monitor treatment. Performed at [facility], [place] [address] [place], [place] [address]     RADIOLOGY STUDIES/RESULTS: Dg Chest 2 View  Result Date: 11/03/2017 CLINICAL DATA:  Follow-up infiltrate EXAM: CHEST - 2 VIEW COMPARISON:  Earlier study of  11/03/2017 FINDINGS: Enlargement of cardiac silhouette post CABG. Atherosclerotic calcification aorta. Mediastinal contours normal. Slight pulmonary vascular congestion.  Improved interstitial edema since the earlier study. Persistent RIGHT pleural effusion and basilar atelectasis. No pneumothorax. Bones demineralized. IMPRESSION: Enlargement of cardiac silhouette post CABG with pulmonary vascular congestion. Slightly improved pulmonary edema. Persistent RIGHT pleural effusion and basilar atelectasis. Electronically Signed   By: Lavonia Dana M.D.   On: 11/03/2017 18:52   Ct Head Wo Contrast  Result Date: 11/03/2017 CLINICAL DATA:  Fall.  Altered mental status. EXAM: CT HEAD WITHOUT CONTRAST TECHNIQUE: Contiguous axial images were obtained from the base of the skull through the vertex without intravenous contrast. COMPARISON:  None. FINDINGS: Brain: There is no evidence of acute infarct, intracranial hemorrhage, mass, midline shift, or extra-axial fluid collection. There is a small left cerebellar infarct with volume loss compatible with a chronic infarct. Bilateral basal ganglia lacunar infarcts are also most likely chronic. Patchy periventricular white matter hypodensities are nonspecific but compatible with mild chronic small vessel ischemic disease. Vascular: Calcified atherosclerosis at the skull base. No hyperdense vessel. Skull: No fracture or suspicious osseous lesion. Moderate-sized right frontal scalp hematoma with underlying prominent vascular channel in the frontal bone. Sinuses/Orbits: Chronic sphenoid sinusitis with sinus expansion, wall thickening and sclerosis, and complex and partially calcified central material in the sinus. Milder mucosal thickening elsewhere in the paranasal sinuses. Clear mastoid air cells. Bilateral cataract extraction. Other: None. IMPRESSION: 1. No evidence of acute intracranial abnormality. 2. Right frontal scalp hematoma.  No skull fracture. 3. Small basal ganglia and cerebellar infarcts, likely chronic. 4. Mild chronic small vessel ischemic white matter disease. 5. Chronic sphenoid sinusitis. These results were called by telephone at the  time of interpretation on 11/03/2017 at 9:16 am to Dr. Marda Stalker , who verbally acknowledged these results. Electronically Signed   By: Logan Bores M.D.   On: 11/03/2017 09:29   Dg Chest Portable 1 View  Result Date: 11/26/2017 CLINICAL DATA:  Fall. EXAM: PORTABLE CHEST 1 VIEW COMPARISON:  11/03/2017. FINDINGS: Prior CABG. Cardiomegaly with pulmonary venous congestion, bilateral interstitial prominence and right-sided pleural effusion. Findings consistent with CHF. Findings have progressed from prior study of 11/03/2017. IMPRESSION: Prior CABG. Cardiomegaly with bilateral pulmonary infiltrates/edema and right-sided pleural effusion. Findings consistent with CHF. Findings have progressed from prior study of 11/03/2017. Electronically Signed   By: Marcello Moores  Register   On: 11/26/2017 13:55   Dg Chest Portable 1 View  Result Date: 11/03/2017 CLINICAL DATA:  Shortness of breath and recent fall, initial encounter EXAM: PORTABLE CHEST 1 VIEW COMPARISON:  None. FINDINGS: Cardiac shadow is enlarged. Aortic calcifications are again seen. The lungs are well aerated bilaterally with patchy infiltrate in the right lung base. No sizable effusion is noted. No bony abnormality is seen. IMPRESSION: Right basilar infiltrate. Followup PA and lateral chest X-ray is recommended in 3-4 weeks following trial of antibiotic therapy to ensure resolution and exclude underlying malignancy. Electronically Signed   By: Inez Catalina M.D.   On: 11/03/2017 08:48     LOS: 3 days   Oren Binet, MD  [facility]  If 7PM-7AM, please contact night-coverage  Please page via [email or web address]-Password [email or web address]-click on MD name and type text message  11/30/2017, 11:39 AM

## 2017-11-30 NOTE — Progress Notes (Signed)
Patient ID: [name], female   DOB: 01-Aug-1945, 72 y.o.   MRN: [id number] [facility] Progress Note   Assessment/ Plan:   1.  Acute kidney injury on chronic kidney disease stage V: With continued rise of creatinine/BUN with mild uremic symptoms noted with escalation of diuretic therapy for CHF exacerbation.  The plan is for tunneled hemodialysis catheter placement as her right radiocephalic AVF placed on 11/08/3823 is immature at this time.  Plan for initiation of hemodialysis today with initiation of process for outpatient dialysis unit placement. 2.  CHF exacerbation: Currently net negative with good diuretic response overnight to furosemide 160 mg every 6 hours and metolazone 10 mg daily-plan for initiation of hemodialysis with ultrafiltration today after placement of dialysis catheter. 3.  Anemia of chronic kidney disease: Ongoing intravenous iron replacement and initiation of ESA. 4.  Secondary hyperparathyroidism: Started on calcium acetate for phosphorus binding, PTH 234-at goal for chronic kidney disease stage V. 5.  Hypertension: Compounded by volume overload, monitor with diuretic therapy/dialysis.  Subjective:   Reports to be feeling hungry this morning, denies any nausea or dysgeusia.  Denies any chest pain but had some shortness of breath overnight.   Objective:   BP (!) 152/58 (BP Location: Left Arm) Comment: RN notified  Pulse 75   Temp 98 F (36.7 C) (Oral)   Resp 19   Ht 5\' 5"  (1.651 m)   Wt 83.4 kg Comment: Scale A  SpO2 95%   BMI 30.59 kg/m   Intake/Output Summary (Last 24 hours) at 11/30/2017 0838 Last data filed at 11/30/2017 0810 Gross per 24 hour  Intake 600 ml  Output 4650 ml  Net -4050 ml   Weight change: -2.404 kg  Physical Exam: Gen: Comfortably resting in bed, nurse at bedside administering medications CVS: Pulse regular rhythm, normal S1 and S2, no murmur Resp: Fine rales over both bases, no rhonchi Abd: Soft, obese, nontender Ext:  1-2+ lower extremity edema, right radiocephalic fistula with good thrill  Imaging: No results found.  Labs: BMET Recent Labs  Lab 11/26/17 1331 11/26/17 1352 11/27/17 0614 11/28/17 0457 11/29/17 0644 11/30/17 0544  NA 140 137 136 134* 135 131*  K 4.2 4.0 4.1 4.2 3.8 2.8*  CL 107 104 102 101 96* 93*  CO2 22  --  24 22 25 27   GLUCOSE 98 89 177* 211* 154* 188*  BUN 104* 104* 100* 107* 104* 106*  CREATININE 3.99* 4.10* 3.95* 3.93* 4.06* 4.20*  CALCIUM 8.7*  --  8.9 8.5* 8.9 8.6*  PHOS  --   --   --  7.7* 7.4* 6.4*   CBC Recent Labs  Lab 11/26/17 1331 11/26/17 1352 11/27/17 0614 11/28/17 0457 11/30/17 0544  WBC 7.8  --  7.3 7.2 7.4  NEUTROABS 6.0  --   --   --   --   HGB 10.4* 11.6* 9.3* 9.3* 9.8*  HCT 33.8* 34.0* 29.9* 30.1* 31.4*  MCV 88.3  --  85.9 86.5 84.4  PLT 180  --  186 179 193    Medications:    . aspirin EC  81 mg Oral Daily  . atorvastatin  80 mg Oral q1800  . calcium acetate  667 mg Oral TID WC  . Chlorhexidine Gluconate Cloth  6 each Topical Q0600  . darbepoetin (ARANESP) injection - NON-DIALYSIS  100 mcg Subcutaneous Q Fri-1800  . [START ON 12/01/2017] heparin injection (subcutaneous)  5,000 Units Subcutaneous Q8H  . hydrALAZINE  10 mg Oral Q8H  .  insulin aspart  0-9 Units Subcutaneous TID WC  . isosorbide dinitrate  10 mg Oral TID  . metolazone  10 mg Oral Daily  . pantoprazole  40 mg Oral Q1200  . polyethylene glycol  17 g Oral BID  . sodium chloride flush  3 mL Intravenous Q12H    Elmarie Shiley, MD 11/30/2017, 8:38 AM

## 2017-11-30 NOTE — Progress Notes (Signed)
Patient came back from dialysis, she is in bed with no discomfort.

## 2017-12-01 ENCOUNTER — Encounter (HOSPITAL_COMMUNITY): Payer: Self-pay | Admitting: Cardiology

## 2017-12-01 ENCOUNTER — Encounter (HOSPITAL_COMMUNITY)
Admission: EM | Disposition: A | Discharge status: Home Health | Payer: Self-pay | Source: Home / Self Care | Attending: Internal Medicine

## 2017-12-01 ENCOUNTER — Inpatient Hospital Stay (HOSPITAL_COMMUNITY): Payer: Medicare Other

## 2017-12-01 DIAGNOSIS — I340 Nonrheumatic mitral (valve) insufficiency: Secondary | ICD-10-CM

## 2017-12-01 DIAGNOSIS — I058 Other rheumatic mitral valve diseases: Secondary | ICD-10-CM

## 2017-12-01 HISTORY — PX: TEE WITHOUT CARDIOVERSION: SHX5443

## 2017-12-01 LAB — RENAL FUNCTION PANEL
ALBUMIN: 3.2 g/dL — AB (ref 3.5–5.0)
Anion gap: 14 (ref 5–15)
BUN: 68 mg/dL — AB (ref 8–23)
CO2: 27 mmol/L (ref 22–32)
Calcium: 9 mg/dL (ref 8.9–10.3)
Chloride: 95 mmol/L — ABNORMAL LOW (ref 98–111)
Creatinine, Ser: 3.59 mg/dL — ABNORMAL HIGH (ref 0.44–1.00)
GFR, EST AFRICAN AMERICAN: 14 mL/min — AB (ref >60)
GFR, EST NON AFRICAN AMERICAN: 12 mL/min — AB (ref >60)
Glucose, Bld: 159 mg/dL — ABNORMAL HIGH (ref 70–99)
PHOSPHORUS: 5.2 mg/dL — AB (ref 2.5–4.6)
POTASSIUM: 3 mmol/L — AB (ref 3.5–5.1)
Sodium: 136 mmol/L (ref 135–145)

## 2017-12-01 LAB — GLUCOSE, CAPILLARY
GLUCOSE-CAPILLARY: 155 mg/dL — AB (ref 70–99)
GLUCOSE-CAPILLARY: 146 mg/dL — AB (ref 70–99)
GLUCOSE-CAPILLARY: 158 mg/dL — AB (ref 70–99)
GLUCOSE-CAPILLARY: 188 mg/dL — AB (ref 70–99)

## 2017-12-01 SURGERY — ECHOCARDIOGRAM, TRANSESOPHAGEAL
Anesthesia: Moderate Sedation

## 2017-12-01 MED ORDER — LACTULOSE 10 GM/15ML PO SOLN: 30.0000 g | Freq: Two times a day (BID) | ORAL | Status: DC | PRN

## 2017-12-01 MED ORDER — MIDAZOLAM HCL 10 MG/2ML IJ SOLN
INTRAMUSCULAR | Status: DC | PRN
  Administered 2017-12-01 (×2): 2 mg via INTRAVENOUS

## 2017-12-01 MED ORDER — CALCIUM ACETATE (PHOS BINDER) 667 MG PO CAPS
667.0000 mg | ORAL_CAPSULE | Freq: Three times a day (TID) | ORAL | Status: DC
  Administered 2017-12-02 – 2017-12-04 (×4): 667 mg via ORAL
  Filled 2017-12-01 (×4): qty 1

## 2017-12-01 MED ORDER — SODIUM CHLORIDE 0.9 % IV SOLN
INTRAVENOUS | Status: DC
  Administered 2017-12-01 – 2017-12-02 (×2): via INTRAVENOUS

## 2017-12-01 MED ORDER — POTASSIUM CHLORIDE CRYS ER 20 MEQ PO TBCR
40.0000 meq | EXTENDED_RELEASE_TABLET | Freq: Once | ORAL | Status: AC
  Administered 2017-12-01: 40 meq via ORAL
  Filled 2017-12-01: qty 2

## 2017-12-01 MED ORDER — FENTANYL CITRATE (PF) 100 MCG/2ML IJ SOLN
INTRAMUSCULAR | Status: AC
  Filled 2017-12-01: qty 2

## 2017-12-01 MED ORDER — SENNOSIDES-DOCUSATE SODIUM 8.6-50 MG PO TABS
2.0000 | ORAL_TABLET | Freq: Two times a day (BID) | ORAL | Status: DC
  Administered 2017-12-01: 2 via ORAL
  Filled 2017-12-01 (×3): qty 2

## 2017-12-01 MED ORDER — POTASSIUM CHLORIDE CRYS ER 20 MEQ PO TBCR: 40.0000 meq | EXTENDED_RELEASE_TABLET | Freq: Once | ORAL | Status: DC

## 2017-12-01 MED ORDER — MIDAZOLAM HCL 5 MG/ML IJ SOLN
INTRAMUSCULAR | Status: AC
  Filled 2017-12-01: qty 2

## 2017-12-01 MED ORDER — FENTANYL CITRATE (PF) 100 MCG/2ML IJ SOLN
INTRAMUSCULAR | Status: DC | PRN
  Administered 2017-12-01 (×2): 25 ug via INTRAVENOUS

## 2017-12-01 MED ORDER — ONDANSETRON HCL 4 MG/2ML IJ SOLN
INTRAMUSCULAR | Status: AC
  Filled 2017-12-01: qty 2

## 2017-12-01 MED ORDER — DARBEPOETIN ALFA 100 MCG/0.5ML IJ SOSY: 100.0000 ug | PREFILLED_SYRINGE | INTRAMUSCULAR | Status: DC

## 2017-12-01 MED ORDER — BUTAMBEN-TETRACAINE-BENZOCAINE 2-2-14 % EX AERO
INHALATION_SPRAY | CUTANEOUS | Status: DC | PRN
  Administered 2017-12-01: 2 via TOPICAL

## 2017-12-01 NOTE — Progress Notes (Signed)
  Echocardiogram Echocardiogram Transesophageal has been performed.  [name] 12/01/2017, 3:05 PM

## 2017-12-01 NOTE — Evaluation (Signed)
Physical Therapy Evaluation Patient Details Name: [name] MRN: [id number] DOB: Aug 25, 1945 Today's Date: 12/01/2017   History of Present Illness  72yo female presenting to the ED with bradycardia, poor renal functoin, DOE, B LE edema. Per EKG, bigeminy with HR 30BPM. Diagnosed with acute on chronic CHF, bradycardia, acute on chronic renal dysfunction. PMH bradycardia, CHF, CKD, foot ulcers, HTN, L BBB, neuropathy, NSTEMI, PAD, DM, toe and ray amputations   Clinical Impression   Patient received in bed, fatigued but willing to work with PT this morning. She requires MinA for supine to sit, but was able to otherwise perform functional transfers and gait with IV pole with Min guard today. Able to toilet with S and occasional cues for safety, then able to tolerate gait training approximately 23f in hallway with IV pole and Min guard to steady, occasionally reaching for wall railings as additional support. She required one rest break during gait period due to fatigue. She was left in bed with all needs met, bed alarm activated this morning. She will continue to benefit from skilled PT services in the acute setting, as well as skilled HHPT services to further address functional deficits moving forward.     Follow Up Recommendations Home health PT    Equipment Recommendations  Rolling walker with 5" wheels;3in1 (PT)    Recommendations for Other Services       Precautions / Restrictions Precautions Precautions: Fall Restrictions Weight Bearing Restrictions: No      Mobility  Bed Mobility Overal bed mobility: Needs Assistance Bed Mobility: Supine to Sit;Sit to Supine     Supine to sit: Min assist Sit to supine: Min guard   General bed mobility comments: MinA to power trunk up to full upright  Transfers Overall transfer level: Needs assistance Equipment used: None(IV pole ) Transfers: Sit to/from Stand Sit to Stand: Min guard         General transfer comment: Min guard  for safety, no physical assist provided   Ambulation/Gait Ambulation/Gait assistance: Min guard Gait Distance (Feet): 200 Feet Assistive device: IV Pole Gait Pattern/deviations: Step-through pattern;Decreased step length - right;Decreased step length - left;Decreased stride length;Decreased dorsiflexion - right;Decreased dorsiflexion - left;Drifts right/left;Narrow base of support     General Gait Details: flat feet at baseline, mildly  unsteady when pushing IV pole requiring Min guard to steady and for safety; one standing rest break taken during gait   Stairs            Wheelchair Mobility    Modified Rankin (Stroke Patients Only)       Balance Overall balance assessment: Needs assistance Sitting-balance support: Bilateral upper extremity supported;Feet supported Sitting balance-Leahy Scale: Good     Standing balance support: No upper extremity supported;During functional activity Standing balance-Leahy Scale: Fair                               Pertinent Vitals/Pain Pain Assessment: No/denies pain    Home Living Family/patient expects to be discharged to:: Private residence Living Arrangements: Spouse/significant other Available Help at Discharge: Family Type of Home: House Home Access: Stairs to enter Entrance Stairs-Rails: None Entrance Stairs-Number of Steps: 1 and 1  Home Layout: Two level;Able to live on main level with bedroom/bathroom Home Equipment: WGilford Rile- 2 wheels;Shower seat      Prior Function Level of Independence: Independent               Hand Dominance  Extremity/Trunk Assessment   Upper Extremity Assessment Upper Extremity Assessment: Defer to OT evaluation    Lower Extremity Assessment Lower Extremity Assessment: Generalized weakness    Cervical / Trunk Assessment Cervical / Trunk Assessment: Kyphotic  Communication   Communication: No difficulties  Cognition Arousal/Alertness: Awake/alert Behavior  During Therapy: WFL for tasks assessed/performed Overall Cognitive Status: Within Functional Limits for tasks assessed                                        General Comments      Exercises     Assessment/Plan    PT Assessment Patient needs continued PT services  PT Problem List Decreased strength;Decreased mobility;Decreased coordination;Decreased activity tolerance;Decreased balance       PT Treatment Interventions DME instruction;Therapeutic activities;Therapeutic exercise;Gait training;Patient/family education;Stair training;Balance training;Functional mobility training;Neuromuscular re-education    PT Goals (Current goals can be found in the Care Plan section)  Acute Rehab PT Goals Patient Stated Goal: to get stronger and go home  PT Goal Formulation: With patient Time For Goal Achievement: 12/15/17 Potential to Achieve Goals: Good    Frequency Min 3X/week   Barriers to discharge        Co-evaluation               AM-PAC PT "6 Clicks" Daily Activity  Outcome Measure Difficulty turning over in bed (including adjusting bedclothes, sheets and blankets)?: A Little Difficulty moving from lying on back to sitting on the side of the bed? : A Little Difficulty sitting down on and standing up from a chair with arms (e.g., wheelchair, bedside commode, etc,.)?: A Little Help needed moving to and from a bed to chair (including a wheelchair)?: A Little Help needed walking in hospital room?: A Little Help needed climbing 3-5 steps with a railing? : A Lot 6 Click Score: 17    End of Session   Activity Tolerance: Patient tolerated treatment well Patient left: in bed;with bed alarm set;with call bell/phone within reach   PT Visit Diagnosis: Unsteadiness on feet (R26.81);Muscle weakness (generalized) (M62.81);Difficulty in walking, not elsewhere classified (R26.2);History of falling (Z91.81)    Time: 7530-1040 PT Time Calculation (min) (ACUTE ONLY): 26  min   Charges:   PT Evaluation $PT Eval Moderate Complexity: 1 Mod PT Treatments $Gait Training: 8-22 mins        Deniece Ree PT, DPT, CBIS  Supplemental Physical Therapist [place]   Pager [phone]

## 2017-12-01 NOTE — Progress Notes (Signed)
To the best of my knowledge, documentation by D Downs, [employer] nursing student, is correct. 

## 2017-12-01 NOTE — Progress Notes (Signed)
Patient ID: [name], female   DOB: 1945-11-14, 72 y.o.   MRN: [id number] [facility] Progress Note   Assessment/ Plan:   1.  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  Maturing right radiocephalic AVF placed on 11/09/1322.  We will plan for her next hemodialysis again tomorrow in order not to interfere with TEE schedule, process for outpatient dialysis unit placement has been initiated. 2.  CHF exacerbation: Remains net negative with regards to fluid balance-will continue furosemide and metolazone today and discontinue these tomorrow as she gets on scheduled hemodialysis. 3.  Anemia of chronic kidney disease: Ongoing intravenous iron replacement and initiation of ESA. 4.  Secondary hyperparathyroidism: Started on calcium acetate for phosphorus binding, PTH 234-at goal for chronic kidney disease stage V. 5.  Hypertension: Compounded by volume overload, monitor with diuretic therapy/dialysis.  Subjective:   Complains of constipation this morning and planned for TEE later today ~4pm.   Objective:   BP (!) 159/56 (BP Location: Left Arm)   Pulse 75   Temp 98.5 F (36.9 C) (Oral)   Resp 19   Ht '5\' 5"'  (1.651 m)   Wt 79.5 kg Comment: scale a  SpO2 97%   BMI 29.17 kg/m   Intake/Output Summary (Last 24 hours) at 12/01/2017 0825 Last data filed at 12/01/2017 0600 Gross per 24 hour  Intake 952.63 ml  Output 4000 ml  Net -3047.37 ml   Weight change: -0.771 kg  Physical Exam: Gen: Comfortably sleeping in bed- easy to awaken CVS: Pulse regular rhythm, normal S1 and S2, no murmur Resp: Fine rales over both bases, no rhonchi Abd: Soft, obese, nontender Ext: 1+ lower extremity edema, right radiocephalic fistula with good thrill  Imaging: Ir Fluoro Guide Cv Line Right  Result  Date: 11/30/2017 INDICATION: 72 year old female with renal failure EXAM: TUNNELED CENTRAL VENOUS HEMODIALYSIS CATHETER PLACEMENT WITH ULTRASOUND AND FLUOROSCOPIC GUIDANCE MEDICATIONS: 2.0 g Ancef. The antibiotic was given in an appropriate time interval prior to skin puncture. ANESTHESIA/SEDATION: Moderate (conscious) sedation was employed during this procedure. A total of Versed 1.0 mg and Fentanyl 100 mcg was administered intravenously. Moderate Sedation Time: 10 minutes. The patient's level of consciousness and vital signs were monitored continuously by radiology nursing throughout the procedure under my direct supervision. FLUOROSCOPY TIME:  Fluoroscopy Time: 0 minutes 30 seconds (2 mGy). COMPLICATIONS: None PROCEDURE: Informed written consent was obtained from the patient after a discussion of the risks, benefits, and alternatives to treatment. Questions regarding the procedure were encouraged and answered. The right neck and chest were prepped with chlorhexidine in a sterile fashion, and a sterile drape was applied covering the operative field. Maximum barrier sterile technique with sterile gowns and gloves were used for the procedure. A timeout was performed prior to the initiation of the procedure. After creating a small venotomy incision, a micropuncture kit was utilized to access the right internal jugular vein under direct, real-time ultrasound guidance after the overlying soft tissues were anesthetized with 1% lidocaine with epinephrine. Ultrasound image documentation was performed. The microwire was marked to measure appropriate internal catheter length. External tunneled length was estimated. A total tip to cuff length of 19 cm was selected. Skin and subcutaneous tissues of chest wall below the clavicle were generously infiltrated with 1% lidocaine for local anesthesia. A small stab incision was made with  11 blade scalpel. The selected hemodialysis catheter was tunneled in a retrograde fashion from the  anterior chest wall to the venotomy incision. A guidewire was advanced to the level of the IVC and the micropuncture sheath was exchanged for a peel-away sheath. The catheter was then placed through the peel-away sheath with tips ultimately positioned within the superior aspect of the right atrium. Final catheter positioning was confirmed and documented with a spot radiographic image. The catheter aspirates and flushes normally. The catheter was flushed with appropriate volume heparin dwells. The catheter exit site was secured with a 0-Prolene retention suture. The venotomy incision was closed Derma bond and sterile dressing. Dressings were applied at the chest wall. Patient tolerated the procedure well and remained hemodynamically stable throughout. No complications were encountered and no significant blood loss encountered. IMPRESSION: Status post right IJ tunneled hemodialysis catheter placement. Catheter ready for use. Signed, Dulcy Fanny. Dellia Nims, RPVI [facility] Radiology Electronically Signed   By: Corrie Mckusick D.O.   On: 11/30/2017 11:49   Dg Chest Port 1 View  Result Date: 11/30/2017 CLINICAL DATA:  72 year old female status post placement of a dialysis catheter. EXAM: PORTABLE CHEST 1 VIEW COMPARISON:  Intra procedural imaging obtained earlier today 11/30/2017 at 11:08 a.m. Most recent prior chest x-ray 11/26/2017 FINDINGS: Right IJ approach tunneled hemodialysis catheter. The tip of the catheter overlies the mid SVC. Stable cardiomegaly. Atherosclerotic calcifications are present in the transverse aorta. 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 There is a moderately large right-sided pleural effusion with associated right basilar atelectasis. This appears similar compared to prior imaging. No evidence of pneumothorax. No new pleural effusion. No acute osseous abnormality. IMPRESSION: 1. Right IJ tunneled  hemodialysis catheter with the tip overlying the mid SVC. No evidence of pneumothorax. 2. Stable moderately large right pleural effusion and associated basilar atelectasis. 3. Stable cardiomegaly. 4.  Aortic Atherosclerosis (ICD10-170.0) Electronically Signed   By: Jacqulynn Cadet M.D.   On: 11/30/2017 14:07    Labs: BMET Recent Labs  Lab 11/26/17 1331 11/26/17 1352 11/27/17 0614 11/28/17 0457 11/29/17 0644 11/30/17 0544  NA 140 137 136 134* 135 131*  K 4.2 4.0 4.1 4.2 3.8 2.8*  CL 107 104 102 101 96* 93*  CO2 22  --  '24 22 25 27  ' GLUCOSE 98 89 177* 211* 154* 188*  BUN 104* 104* 100* 107* 104* 106*  CREATININE 3.99* 4.10* 3.95* 3.93* 4.06* 4.20*  CALCIUM 8.7*  --  8.9 8.5* 8.9 8.6*  PHOS  --   --   --  7.7* 7.4* 6.4*   CBC Recent Labs  Lab 11/26/17 1331 11/26/17 1352 11/27/17 0614 11/28/17 0457 11/30/17 0544  WBC 7.8  --  7.3 7.2 7.4  NEUTROABS 6.0  --   --   --   --   HGB 10.4* 11.6* 9.3* 9.3* 9.8*  HCT 33.8* 34.0* 29.9* 30.1* 31.4*  MCV 88.3  --  85.9 86.5 84.4  PLT 180  --  186 179 193    Medications:    . aspirin EC  81 mg Oral Daily  . atorvastatin  80 mg Oral q1800  . calcium acetate  667 mg Oral TID WC  . Chlorhexidine Gluconate Cloth  6 each Topical Q0600  . darbepoetin (ARANESP) injection - NON-DIALYSIS  100 mcg Subcutaneous Q Fri-1800  . heparin injection (subcutaneous)  5,000 Units Subcutaneous Q8H  . hydrALAZINE  10 mg Oral Q8H  .  insulin aspart  0-9 Units Subcutaneous TID WC  . isosorbide dinitrate  10 mg Oral TID  . metolazone  10 mg Oral Daily  . pantoprazole  40 mg Oral Q1200  . polyethylene glycol  17 g Oral BID  . senna-docusate  2 tablet Oral BID  . sodium chloride flush  3 mL Intravenous Q12H    Elmarie Shiley, MD 12/01/2017, 8:25 AM

## 2017-12-01 NOTE — Progress Notes (Signed)
Received post TEE,patient alert and oriented, husband at bedside.

## 2017-12-01 NOTE — Progress Notes (Addendum)
PROGRESS NOTE        PATIENT DETAILS Name: [name] Age: 72 y.o. Sex: female Date of Birth: 05-08-1945 Admit Date: 11/26/2017 Admitting Physician Reyne Dumas, MD [id number], Gwyndolyn Saxon, MD  Brief Narrative: Patient is a 72 y.o. female with history of CKD stage IV, CAD status post CABG, chronic combined systolic and diastolic heart failure with anasarca in the setting of worsening renal function.  Plans are to initiate high-dose IV diuretics, if no response then probably will require initiation of dialysis.  See below for further details  Subjective: Mild decrease in lower extremity edema following initiation of dialysis.  She still has not had a bowel movement.  Assessment/Plan: Anasarca in the setting of worsening renal function (now ESRD) and decompensated diastolic heart failure: Volume status did not improve in spite of being on high-dose diuretics, HD started on 8/26- decrease in lower extremity edema today. Nephrology following and directing care. TTE on 8/23 showed preserved EF.   Acute kidney injury on chronic kidney disease stage IV progression to ESRD: See above.    Sinus bradycardia: Heart rate stable after discontinuation of beta-blocker.  Continue to avoid rate control medications.  TSH within normal limits.   Per H&P, patient was given atropine by EMS enroute to the ED.   Acute metabolic encephalopathy: Improved-secondary to uremia.  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  Blood cultures on 8/24- so far.  Clinical scenario is not consistent with endocarditis.  Addendum: TEE results noted-have formally consulted cardiology for further guidance.  Hypokalemia: Replete and recheck.  Constipation: Still no BM in spite of MiraLAX and senna-ordered enema today.  CAD status post CABG in 2011: No anginal symptoms, continue with aspirin.  Hypertension: Blood pressure continues to  fluctuate-with further dialysis-for now continue with current dosing of hydralazine and Imdur.    DM-2 with hypoglycemia: No further episodes of hypoglycemia, CBG stable with SSI.   Anemia: Secondary to acute illness and underlying CKD.  No evidence of acute blood loss.  Continue IV iron and Aranesp.  Follow CBC periodically.   History of breast cancer status post radiation and lumpectomy in 2015: Will need to resume anastrozole in the next few days.  DVT Prophylaxis: Prophylactic Heparin  Code Status: Full code   Family Communication: None at bedside  Disposition Plan: Remain inpatient-probably home later this week when outpatient dialysis center has been arranged.  Antimicrobial agents: Anti-infectives (From admission, onward)   Start     Dose/Rate Route Frequency Ordered Stop   11/30/17 0915  ceFAZolin (ANCEF) IVPB 2g/100 mL premix     2 g 200 mL/hr over 30 Minutes Intravenous  Once 11/30/17 0900 11/30/17 1121     Procedures: None  CONSULTS:  nephrology  Time spent: 25 minutes-Greater than 50% of this time was spent in counseling, explanation of diagnosis, planning of further management, and coordination of care.  MEDICATIONS: Scheduled Meds: . [MAR Hold] aspirin EC  81 mg Oral Daily  . [MAR Hold] atorvastatin  80 mg Oral q1800  . [MAR Hold] calcium acetate  667 mg Oral TID WC  . [MAR Hold] Chlorhexidine Gluconate Cloth  6 each Topical Q0600  . [MAR Hold] darbepoetin (ARANESP) injection - DIALYSIS  100 mcg Intravenous Q Fri-HD  . [MAR Hold] heparin injection (subcutaneous)  5,000 Units Subcutaneous  Q8H  . [MAR Hold] hydrALAZINE  10 mg Oral Q8H  . [MAR Hold] insulin aspart  0-9 Units Subcutaneous TID WC  . [MAR Hold] isosorbide dinitrate  10 mg Oral TID  . [MAR Hold] metolazone  10 mg Oral Daily  . [MAR Hold] pantoprazole  40 mg Oral Q1200  . [MAR Hold] polyethylene glycol  17 g Oral BID  . [MAR Hold] senna-docusate  2 tablet Oral BID  . [MAR Hold] sodium  chloride flush  3 mL Intravenous Q12H   Continuous Infusions: . [MAR Hold] sodium chloride    . sodium chloride 20 mL/hr at 12/01/17 1211  . sodium chloride    . [MAR Hold] ferric gluconate (FERRLECIT/NULECIT) IV 125 mg (12/01/17 1004)  . [MAR Hold] furosemide 160 mg (12/01/17 0540)   PRN Meds:.[MAR Hold] sodium chloride, [MAR Hold] acetaminophen **OR** [MAR Hold] acetaminophen, [MAR Hold] dextrose, [MAR Hold] lactulose, [MAR Hold] levalbuterol, [MAR Hold] MUSCLE RUB, [MAR Hold] ondansetron **OR** [MAR Hold] ondansetron (ZOFRAN) IV, [MAR Hold] sodium chloride flush   PHYSICAL EXAM: Vital signs: Vitals:   12/01/17 0446 12/01/17 0601 12/01/17 1100 12/01/17 1201  BP: (!) 158/60 (!) 159/56 (!) 161/70 (!) 187/61  Pulse: 74 75 77 81  Resp: '19  18 19  ' Temp: 98.5 F (36.9 C)  98.5 F (36.9 C) 98.2 F (36.8 C)  TempSrc: Oral  Oral Oral  SpO2: 97%  96% 94%  Weight:   76.5 kg 76.5 kg  Height:    '5\' 5"'  (1.651 m)   Filed Weights   12/01/17 0438 12/01/17 1100 12/01/17 1201  Weight: 79.5 kg 76.5 kg 76.5 kg   Body mass index is 28.07 kg/m.   General appearance:Awake, alert, not in any distress.  Eyes:no scleral icterus. HEENT: Atraumatic and Normocephalic Neck: supple, no JVD. Resp:Good air entry bilaterally,no rales or rhonchi CVS: S1 S2 regular, no murmurs.  GI: Bowel sounds present, Non tender and not distended with no gaurding, rigidity or rebound. Extremities: B/L Lower Ext shows ++ edema, both legs are warm to touch Neurology:  Non focal Psychiatric: Normal judgment and insight. Normal mood. Musculoskeletal:No digital cyanosis Skin:No Rash, warm and dry Wounds:N/A  I have personally reviewed following labs and imaging studies  LABORATORY DATA: CBC: Recent Labs  Lab 11/26/17 1331 11/26/17 1352 11/27/17 0614 11/28/17 0457 11/30/17 0544  WBC 7.8  --  7.3 7.2 7.4  NEUTROABS 6.0  --   --   --   --   HGB 10.4* 11.6* 9.3* 9.3* 9.8*  HCT 33.8* 34.0* 29.9* 30.1* 31.4*    MCV 88.3  --  85.9 86.5 84.4  PLT 180  --  186 179 128    Basic Metabolic Panel: Recent Labs  Lab 11/26/17 1331  11/27/17 0614 11/28/17 0457 11/29/17 0644 11/30/17 0544 12/01/17 0839  NA 140   < > 136 134* 135 131* 136  K 4.2   < > 4.1 4.2 3.8 2.8* 3.0*  CL 107   < > 102 101 96* 93* 95*  CO2 22  --  '24 22 25 27 27  ' GLUCOSE 98   < > 177* 211* 154* 188* 159*  BUN 104*   < > 100* 107* 104* 106* 68*  CREATININE 3.99*   < > 3.95* 3.93* 4.06* 4.20* 3.59*  CALCIUM 8.7*  --  8.9 8.5* 8.9 8.6* 9.0  MG 2.7*  --   --   --   --   --   --   PHOS  --   --   --  7.7* 7.4* 6.4* 5.2*   < > = values in this interval not displayed.    GFR: Estimated Creatinine Clearance: 14.7 mL/min (A) (by C-G formula based on SCr of 3.59 mg/dL (H)).  Liver Function Tests: Recent Labs  Lab 11/26/17 1331 11/27/17 0614 11/28/17 0457 11/29/17 0644 11/30/17 0544 12/01/17 0839  AST 34 24  --   --   --   --   ALT 40 34  --   --   --   --   ALKPHOS 107 101  --   --   --   --   BILITOT 0.8 0.7  --   --   --   --   PROT 5.3* 4.9*  --   --   --   --   ALBUMIN 3.0* 2.8* 2.9* 3.0* 3.1* 3.2*   No results for input(s): LIPASE, AMYLASE in the last 168 hours. Recent Labs  Lab 11/26/17 1903  AMMONIA 59*    Coagulation Profile: Recent Labs  Lab 11/26/17 1331 11/30/17 0544  INR 1.24 1.17    Cardiac Enzymes: No results for input(s): CKTOTAL, CKMB, CKMBINDEX, TROPONINI in the last 168 hours.  BNP (last 3 results) No results for input(s): PROBNP in the last 8760 hours.  HbA1C: No results for input(s): HGBA1C in the last 72 hours.  CBG: Recent Labs  Lab 11/30/17 1251 11/30/17 1840 11/30/17 2130 12/01/17 0736 12/01/17 1117  GLUCAP 161* 177* 215* 155* 146*    Lipid Profile: No results for input(s): CHOL, HDL, LDLCALC, TRIG, CHOLHDL, LDLDIRECT in the last 72 hours.  Thyroid Function Tests: No results for input(s): TSH, T4TOTAL, FREET4, T3FREE, THYROIDAB in the last 72 hours.  Anemia  Panel: No results for input(s): VITAMINB12, FOLATE, FERRITIN, TIBC, IRON, RETICCTPCT in the last 72 hours.  Urine analysis:    Component Value Date/Time   COLORURINE STRAW (A) 11/26/2017 1818   APPEARANCEUR CLEAR 11/26/2017 1818   LABSPEC 1.008 11/26/2017 1818   PHURINE 5.0 11/26/2017 1818   GLUCOSEU NEGATIVE 11/26/2017 1818   HGBUR NEGATIVE 11/26/2017 1818   BILIRUBINUR NEGATIVE 11/26/2017 1818   KETONESUR NEGATIVE 11/26/2017 1818   PROTEINUR 30 (A) 11/26/2017 1818   UROBILINOGEN 0.2 05/30/2009 1500   NITRITE NEGATIVE 11/26/2017 1818   LEUKOCYTESUR NEGATIVE 11/26/2017 1818    Sepsis Labs: Lactic Acid, Venous    Component Value Date/Time   LATICACIDVEN 0.77 11/03/2017 1144    MICROBIOLOGY: Recent Results (from the past 240 hour(s))  Culture, blood (routine x 2)     Status: None (Preliminary result)   Collection Time: 11/28/17 10:46 AM  Result Value Ref Range Status   Specimen Description BLOOD LEFT ARM  Final   Special Requests   Final    BOTTLES DRAWN AEROBIC AND ANAEROBIC Blood Culture adequate volume   Culture   Final    NO GROWTH 3 DAYS Performed at [facility], [place] [address], [place], [place] [address]    Report Status PENDING  Incomplete  Culture, blood (routine x 2)     Status: None (Preliminary result)   Collection Time: 11/28/17 10:54 AM  Result Value Ref Range Status   Specimen Description BLOOD LEFT HAND  Final   Special Requests   Final    BOTTLES DRAWN AEROBIC AND ANAEROBIC Blood Culture adequate volume   Culture   Final    NO GROWTH 3 DAYS Performed at [facility], [place] [address], [place],  [address]    Report Status PENDING  Incomplete  Surgical pcr screen     Status: None   Collection Time: 11/30/17 12:27 AM  Result Value Ref Range Status   MRSA, PCR NEGATIVE NEGATIVE Final   Staphylococcus aureus NEGATIVE NEGATIVE Final    Comment: (NOTE) The Xpert SA Assay (FDA approved for NASAL specimens in patients 38 years of age  and older), is one component of a comprehensive surveillance program. It is not intended to diagnose infection nor to guide or monitor treatment. Performed at [facility], [place] [address] [place], [place] [address]     RADIOLOGY STUDIES/RESULTS: Dg Chest 2 View  Result Date: 11/03/2017 CLINICAL DATA:  Follow-up infiltrate EXAM: CHEST - 2 VIEW COMPARISON:  Earlier study of 11/03/2017 FINDINGS: Enlargement of cardiac silhouette post CABG. Atherosclerotic calcification aorta. Mediastinal contours normal. Slight pulmonary vascular congestion. Improved interstitial edema since the earlier study. Persistent RIGHT pleural effusion and basilar atelectasis. No pneumothorax. Bones demineralized. IMPRESSION: Enlargement of cardiac silhouette post CABG with pulmonary vascular congestion. Slightly improved pulmonary edema. Persistent RIGHT pleural effusion and basilar atelectasis. Electronically Signed   By: Lavonia [place] M.D.   On: 11/03/2017 18:52   Ct Head Wo Contrast  Result Date: 11/03/2017 CLINICAL DATA:  Fall.  Altered mental status. EXAM: CT HEAD WITHOUT CONTRAST TECHNIQUE: Contiguous axial images were obtained from the base of the skull through the vertex without intravenous contrast. COMPARISON:  None. FINDINGS: Brain: There is no evidence of acute infarct, intracranial hemorrhage, mass, midline shift, or extra-axial fluid collection. There is a small left cerebellar infarct with volume loss compatible with a chronic infarct. Bilateral basal ganglia lacunar infarcts are also most likely chronic. Patchy periventricular white matter hypodensities are nonspecific but compatible with mild chronic small vessel ischemic disease. Vascular: Calcified atherosclerosis at the skull base. No hyperdense vessel. Skull: No fracture or suspicious osseous lesion. Moderate-sized right frontal scalp hematoma with underlying prominent vascular channel in the frontal bone. Sinuses/Orbits: Chronic sphenoid sinusitis with  sinus expansion, wall thickening and sclerosis, and complex and partially calcified central material in the sinus. Milder mucosal thickening elsewhere in the paranasal sinuses. Clear mastoid air cells. Bilateral cataract extraction. Other: None. IMPRESSION: 1. No evidence of acute intracranial abnormality. 2. Right frontal scalp hematoma.  No skull fracture. 3. Small basal ganglia and cerebellar infarcts, likely chronic. 4. Mild chronic small vessel ischemic white matter disease. 5. Chronic sphenoid sinusitis. These results were called by telephone at the time of interpretation on 11/03/2017 at 9:16 am to Dr. Marda Stalker , who verbally acknowledged these results. Electronically Signed   By: Logan Bores M.D.   On: 11/03/2017 09:29   Ir Fluoro Guide Cv Line Right  Result Date: 11/30/2017 INDICATION: 72 year old female with renal failure EXAM: TUNNELED CENTRAL VENOUS HEMODIALYSIS CATHETER PLACEMENT WITH ULTRASOUND AND FLUOROSCOPIC GUIDANCE MEDICATIONS: 2.0 g Ancef. The antibiotic was given in an appropriate time interval prior to skin puncture. ANESTHESIA/SEDATION: Moderate (conscious) sedation was employed during this procedure. A total of Versed 1.0 mg and Fentanyl 100 mcg was administered intravenously. Moderate Sedation Time: 10 minutes. The patient's level of consciousness and vital signs were monitored continuously by radiology nursing throughout the procedure under my direct supervision. FLUOROSCOPY TIME:  Fluoroscopy Time: 0 minutes 30 seconds (2 mGy). COMPLICATIONS: None PROCEDURE: Informed written consent was obtained from the patient after a discussion of the risks, benefits, and alternatives to treatment. Questions regarding the procedure were encouraged and answered. The right neck and chest were prepped with chlorhexidine in a sterile fashion, and a sterile  drape was applied covering the operative field. Maximum barrier sterile technique with sterile gowns and gloves were used for the  procedure. A timeout was performed prior to the initiation of the procedure. After creating a small venotomy incision, a micropuncture kit was utilized to access the right internal jugular vein under direct, real-time ultrasound guidance after the overlying soft tissues were anesthetized with 1% lidocaine with epinephrine. Ultrasound image documentation was performed. The microwire was marked to measure appropriate internal catheter length. External tunneled length was estimated. A total tip to cuff length of 19 cm was selected. Skin and subcutaneous tissues of chest wall below the clavicle were generously infiltrated with 1% lidocaine for local anesthesia. A small stab incision was made with 11 blade scalpel. The selected hemodialysis catheter was tunneled in a retrograde fashion from the anterior chest wall to the venotomy incision. A guidewire was advanced to the level of the IVC and the micropuncture sheath was exchanged for a peel-away sheath. The catheter was then placed through the peel-away sheath with tips ultimately positioned within the superior aspect of the right atrium. Final catheter positioning was confirmed and documented with a spot radiographic image. The catheter aspirates and flushes normally. The catheter was flushed with appropriate volume heparin dwells. The catheter exit site was secured with a 0-Prolene retention suture. The venotomy incision was closed Derma bond and sterile dressing. Dressings were applied at the chest wall. Patient tolerated the procedure well and remained hemodynamically stable throughout. No complications were encountered and no significant blood loss encountered. IMPRESSION: Status post right IJ tunneled hemodialysis catheter placement. Catheter ready for use. Signed, Dulcy Fanny. Dellia Nims, RPVI [facility] Electronically Signed   By: Corrie Mckusick D.O.   On: 11/30/2017 11:49   Dg Chest Port 1 View  Result  Date: 11/30/2017 CLINICAL DATA:  72 year old female status post placement of a dialysis catheter. EXAM: PORTABLE CHEST 1 VIEW COMPARISON:  Intra procedural imaging obtained earlier today 11/30/2017 at 11:08 a.m. Most recent prior chest x-ray 11/26/2017 FINDINGS: Right IJ approach tunneled hemodialysis catheter. The tip of the catheter overlies the mid SVC. Stable cardiomegaly. Atherosclerotic calcifications are present in the transverse aorta. 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 There is a moderately large right-sided pleural effusion with associated right basilar atelectasis. This appears similar compared to prior imaging. No evidence of pneumothorax. No new pleural effusion. No acute osseous abnormality. IMPRESSION: 1. Right IJ tunneled hemodialysis catheter with the tip overlying the mid SVC. No evidence of pneumothorax. 2. Stable moderately large right pleural effusion and associated basilar atelectasis. 3. Stable cardiomegaly. 4.  Aortic Atherosclerosis (ICD10-170.0) Electronically Signed   By: Jacqulynn Cadet M.D.   On: 11/30/2017 14:07   Dg Chest Portable 1 View  Result Date: 11/26/2017 CLINICAL DATA:  Fall. EXAM: PORTABLE CHEST 1 VIEW COMPARISON:  11/03/2017. FINDINGS: Prior CABG. Cardiomegaly with pulmonary venous congestion, bilateral interstitial prominence and right-sided pleural effusion. Findings consistent with CHF. Findings have progressed from prior study of 11/03/2017. IMPRESSION: Prior CABG. Cardiomegaly with bilateral pulmonary infiltrates/edema and right-sided pleural effusion. Findings consistent with CHF. Findings have progressed from prior study of 11/03/2017. Electronically Signed   By: Marcello Moores  Register   On: 11/26/2017 13:55   Dg Chest Portable 1 View  Result Date: 11/03/2017 CLINICAL DATA:  Shortness of breath and recent fall, initial encounter EXAM: PORTABLE CHEST 1 VIEW COMPARISON:  None. FINDINGS: Cardiac shadow is enlarged.  Aortic calcifications are again seen.  The lungs are well aerated bilaterally with patchy infiltrate in the right lung base. No sizable effusion is noted. No bony abnormality is seen. IMPRESSION: Right basilar infiltrate. Followup PA and lateral chest X-ray is recommended in 3-4 weeks following trial of antibiotic therapy to ensure resolution and exclude underlying malignancy. Electronically Signed   By: Inez Catalina M.D.   On: 11/03/2017 08:48     LOS: 4 days   Oren Binet, MD  [facility]  If 7PM-7AM, please contact night-coverage  Please page via [email or web address]-click on MD name and type text message  12/01/2017, 12:15 PM

## 2017-12-01 NOTE — Progress Notes (Signed)
    Transesophageal Echocardiogram Note  SARENA JEZEK [id number] May 21, 1945  Procedure: Transesophageal Echocardiogram Indications: mitral valve mass  Procedure Details Consent: Obtained Time Out: Verified patient identification, verified procedure, site/side was marked, verified correct patient position, special equipment/implants available, Radiology Safety Procedures followed,  medications/allergies/relevent history reviewed, required imaging and test results available.  Performed  Medications:  During this procedure the patient is administered a total of Versed 4 mg and Fentanyl 75 mcg  to achieve and maintain moderate conscious sedation.  The patient's heart rate, blood pressure, and oxygen saturation are monitored continuously during the procedure. The period of conscious sedation is 30 minutes, of which I was present face-to-face 100% of this time.  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   Complications: No apparent complications Patient did tolerate procedure well.  Kirk Ruths, MD

## 2017-12-01 NOTE — Interval H&P Note (Signed)
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 After consideration of risks, benefits and other options for treatment, the patient has consented to  Procedure(s): TRANSESOPHAGEAL ECHOCARDIOGRAM (TEE) (N/A) as a surgical intervention .  The patient's history has been reviewed, patient examined, no change in status, stable for surgery.  I have reviewed the patient's chart and labs.  Questions were answered to the patient's satisfaction.     Kirk Ruths

## 2017-12-01 NOTE — Care Management Note (Signed)
Case Management Note  Patient Details  Name: SHAVELLE RUNKEL MRN: [id number] Date of Birth: [birth year]  Subjective/Objective:  AMS                 Action/Plan: Patient lives at home with spouse; PCP: Unk Pinto, MD; has private insurance with Medicare/ [employer] with prescription drug coverage; [facility]; DME - patient states that she has her mother's equipment and does not need anything; her spouse does the cooking, errands and takes her to her apts; [place] choice offered, pt chose [facility]; [place] with [facility] called for arrangements; CM will continue to follow for progression of care. Expected Discharge Date:    possibly 12/03/2017              Expected Discharge Plan:  [place]  In-House Referral:   [facility]  Discharge planning Services  CM Consult    Choice offered to:  Patient  HH Arranged:  [place] Agency:  [facility] (formerly [facility])  Status of Service:  In process, will continue to follow  Sherrilyn Rist [phone] 12/01/2017, 11:10 AM

## 2017-12-02 ENCOUNTER — Other Ambulatory Visit: Payer: Self-pay

## 2017-12-02 DIAGNOSIS — N185 Chronic kidney disease, stage 5: Secondary | ICD-10-CM

## 2017-12-02 DIAGNOSIS — I5189 Other ill-defined heart diseases: Secondary | ICD-10-CM

## 2017-12-02 LAB — CBC
HEMATOCRIT: 31.6 % — AB (ref 36.0–46.0)
Hemoglobin: 9.8 g/dL — ABNORMAL LOW (ref 12.0–15.0)
MCH: 26.9 pg (ref 26.0–34.0)
MCHC: 31 g/dL (ref 30.0–36.0)
MCV: 86.8 fL (ref 78.0–100.0)
Platelets: 179 10*3/uL (ref 150–400)
RBC: 3.64 MIL/uL — AB (ref 3.87–5.11)
RDW: 17.8 % — ABNORMAL HIGH (ref 11.5–15.5)
WBC: 7.8 10*3/uL (ref 4.0–10.5)

## 2017-12-02 LAB — RENAL FUNCTION PANEL
ANION GAP: 13 (ref 5–15)
Albumin: 3.1 g/dL — ABNORMAL LOW (ref 3.5–5.0)
BUN: 72 mg/dL — AB (ref 8–23)
CO2: 29 mmol/L (ref 22–32)
Calcium: 9.2 mg/dL (ref 8.9–10.3)
Chloride: 95 mmol/L — ABNORMAL LOW (ref 98–111)
Creatinine, Ser: 4.12 mg/dL — ABNORMAL HIGH (ref 0.44–1.00)
GFR calc Af Amer: 12 mL/min — ABNORMAL LOW (ref >60)
GFR calc non Af Amer: 10 mL/min — ABNORMAL LOW (ref >60)
GLUCOSE: 167 mg/dL — AB (ref 70–99)
POTASSIUM: 3.3 mmol/L — AB (ref 3.5–5.1)
Phosphorus: 5.4 mg/dL — ABNORMAL HIGH (ref 2.5–4.6)
Sodium: 137 mmol/L (ref 135–145)

## 2017-12-02 LAB — GLUCOSE, CAPILLARY
GLUCOSE-CAPILLARY: 157 mg/dL — AB (ref 70–99)
GLUCOSE-CAPILLARY: 169 mg/dL — AB (ref 70–99)
GLUCOSE-CAPILLARY: 154 mg/dL — AB (ref 70–99)
Glucose-Capillary: 183 mg/dL — ABNORMAL HIGH (ref 70–99)

## 2017-12-02 MED ORDER — POLYETHYLENE GLYCOL 3350 17 G PO PACK
17.0000 g | PACK | Freq: Every day | ORAL | Status: DC
  Administered 2017-12-03: 17 g via ORAL
  Filled 2017-12-02 (×2): qty 1

## 2017-12-02 MED ORDER — ACETAMINOPHEN 325 MG PO TABS
ORAL_TABLET | ORAL | Status: AC
  Filled 2017-12-02: qty 2

## 2017-12-02 MED ORDER — SENNOSIDES-DOCUSATE SODIUM 8.6-50 MG PO TABS
2.0000 | ORAL_TABLET | Freq: Every day | ORAL | Status: DC
  Administered 2017-12-03: 2 via ORAL
  Filled 2017-12-02: qty 2

## 2017-12-02 MED ORDER — HEPARIN SODIUM (PORCINE) 1000 UNIT/ML DIALYSIS
40.0000 [IU]/kg | INTRAMUSCULAR | Status: DC | PRN
  Filled 2017-12-02: qty 4

## 2017-12-02 NOTE — Consult Note (Signed)
Cardiology Consultation:   Patient ID: [name]; [id number]; 09-Jul-1945   Admit date: 11/26/2017 Date of Consult: 12/02/2017  Primary Care Provider: Unk Pinto, MD Primary Cardiologist: Pamela Rouge, MD  Primary Electrophysiologist:     Patient Profile:   [name] is a 72 y.o. female with a hx of CAD s/p NSTEMI 2011 and CABG with [name], chronic combined systolic and diastolic heart failure, CKD stage IV (baseline creatinine near 2.7-3.1), HTN, HLD, DM, LE PAD, LBBB, arthritis, CA s/p lumpectomy and radiation, and gout who is being seen today for the evaluation of mass on mitral valve at the request of [name].  History of Present Illness:   [name] has a complex cardiac history. She was seen 02/2017 for preoperative clearance for toe amputation. She was admitted 04/2017 with SOB and DOE with edema. Echo 05/04/17 with LVEF of 45-50% and grade 2 DD; also with moderate mitral regurgitation. She was diureesed and discharged home. Hydralazine and coreg were increased and isordil was added. She was last seen by Pamela Merle NP 10/07/17. She was doing well at that time without medication changes. Nephrology had been adjusting her diuretics. She admitted to dietary indiscretions.   She presented to [name] with bradycardia in the setting of worsening renal function, DOE and worsening edema. She received atropine in route with EMS and initial EKG with bigeminy. CXR with bilateral pulmonary infiltrates and right sided pleural effusion, consistent with CHF. She was admitted. Nephrology was consulted and she ultimately underwent 2 HD sessions (second one today). Given her CHF exacerbation picture, TTE was repeated and showed a mass on her mitral valve. Of note, her EF was normal with grade 2 DD. TEE was then performed and confirmed mobile mass on her mitral valve. Cardiology was consulted for further management.    Past Medical History:  Diagnosis Date  . Allergy   . Arthritis     "maybe in my lower back" (08/27/2012)  . 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 Chronic combined systolic and diastolic CHF (congestive heart failure) ([name])   . CKD (chronic kidney disease), stage IV ([name])   . Coronary artery disease    a. NSTEMI in 05/2009 s/p CABG (LIMA-LAD, SVG-diagonal, SVG-OM1/OM 2).   Marland Kitchen Dyspnea    with exertion  . Family history of anesthesia complication    [name] has a hard time to wake up  . Foot ulcer ([name])    "I've had them on both feet" (08/27/2012)  . Gouty arthritis    "right index finger" (08/27/2012)  . History of radiation therapy 06/09/13-07/06/13   left breast 50Gy  . Hyperlipidemia   . Hypertension   . Infection    right second toe  . Ischemic cardiomyopathy    a. 2011: EF 40-45%. b. EF 55-60% in 04/2017 but shortly after as inpatient was 45-50%.  Marland Kitchen LBBB (left bundle branch block)   . Mitral regurgitation   . Neuropathy    Hx; of B/L feet  . NSTEMI (non-ST elevated myocardial infarction) ([name]) 05/23/2009  . Osteomyelitis of foot ([place])   . PAD (peripheral artery disease) ([name])    a. LE PAD (patient previously elected hold off L fem-pop), prev followed by [name]  . PONV (postoperative nausea and vomiting)   . Sinus headache    "occasionally" (08/27/2012)  . Type II diabetes mellitus ([place])   . Vitamin D deficiency     Past Surgical History:  Procedure Laterality Date  .  AMPUTATION Right 08/27/2012   Procedure: AMPUTATION RAY;  Surgeon: Pamela Minion, MD;  Location: [place];  Service: Orthopedics;  Laterality: Right;  Right Foot 5th Ray Amputation  . AMPUTATION Left 07/08/2013   Procedure: AMPUTATION RAY;  Surgeon: Pamela Minion, MD;  Location: [place];  Service: Orthopedics;  Laterality: Left;  Left Foot 2nd Ray Amputation  . AMPUTATION RAY Right 08/27/2012   5th ray/notes 08/27/2012  . AMPUTATION TOE Right 03/06/2017   Procedure: AMPUTATION TOE, INTERPHANGEAL 2ND RIGHT;  Surgeon: [name],  DPM;  Location: [place];  Service: Podiatry;  Laterality: Right;  . AV FISTULA PLACEMENT Right 11/11/2017   Procedure: RIGHT RADIOCEPHALIC  ARTERIOVENOUS FISTULA;  Surgeon: Pamela Posner, MD;  Location: [place];  Service: Vascular;  Laterality: Right;  . BREAST LUMPECTOMY  04/20/2013   with biopsy      [name]  . BREAST LUMPECTOMY WITH NEEDLE LOCALIZATION AND AXILLARY SENTINEL LYMPH NODE BX Left 04/20/2013   Procedure: LEFT BREAST WIRE GUIDED LUMPECTOMY AND AXILLARY SENTINEL LYMPH NODE BX;  Surgeon: Pamela Bookbinder, MD;  Location: [place];  Service: General;  Laterality: Left;  . CARDIAC CATHETERIZATION  05/24/2009   [name] 05/24/2009 (08/27/2012)  . CATARACT EXTRACTION W/ INTRAOCULAR LENS  IMPLANT, BILATERAL  2000's  . COLONOSCOPY W/ BIOPSIES AND POLYPECTOMY  2010  . CORONARY ARTERY BYPASS GRAFT  2011   "CABG X4" (08/27/2012)  . DENTAL SURGERY  04/30/11   "1 implant" (08/27/2012)  . DILATION AND CURETTAGE OF UTERUS    . EYE SURGERY    . FINGER SURGERY Right    "index finger; turned out to be gout" (08/27/2012)  . IR FLUORO GUIDE CV LINE RIGHT  11/30/2017  . TEE WITHOUT CARDIOVERSION N/A 12/01/2017   Procedure: TRANSESOPHAGEAL ECHOCARDIOGRAM (TEE);  Surgeon: Pamela Perla, MD;  [name];  Service: Cardiovascular;  Laterality: N/A;     Home Medications:  Prior to Admission medications   Medication Sig Start Date End Date Taking? Authorizing Provider  acetaminophen (TYLENOL) 500 MG tablet Take 1,000 mg by mouth every 6 (six) hours as needed for mild pain or headache.    Yes [provider]  Acetaminophen-Aspirin Buffered (EXCEDRIN BACK & BODY) 250-250 MG tablet Take 1 tablet by mouth every 6 (six) hours as needed (for back pain).   Yes [provider]  allopurinol (ZYLOPRIM) 100 MG tablet Take 100 mg by mouth 2 (two) times daily.   Yes [provider]  anastrozole (ARIMIDEX) 1 MG tablet Take 1 tablet (1 mg total) by mouth daily. 02/02/17  Yes Magrinat,  Virgie Dad, MD  aspirin EC 81 MG tablet Take 1 tablet (81 mg total) by mouth daily. Patient taking differently: Take 81 mg by mouth every evening.  05/18/17  Yes Gerhardt, Marlane Hatcher, NP  atorvastatin (LIPITOR) 80 MG tablet TAKE 1 TABLET DAILY AT 6 PM Patient taking differently: Take 80 mg by mouth daily at 6 PM.  10/26/17  Yes Liane Comber, NP  atorvastatin (LIPITOR) 80 MG tablet Take 80 mg by mouth at bedtime.  10/27/17  Yes [provider]  bumetanide (BUMEX) 2 MG tablet Take 1 tablet (2 mg total) by mouth 2 (two) times daily. 11/05/17  Yes Isabelle Course, MD  carvedilol (COREG) 25 MG tablet TAKE 1 TABLET TWICE DAILY WITH MEALS 10/26/17  Yes Liane Comber, NP  Cholecalciferol (VITAMIN D3) 2000 units TABS Take 4,000 Units by mouth daily.    Yes [provider]  hydrALAZINE (APRESOLINE) 25  MG tablet TAKE 3 TABLETS THREE TIMES DAILY Patient taking differently: Take 75 mg by mouth 3 (three) times daily.  11/18/17  Yes Pamela Pinto, MD  insulin NPH-regular Human (NOVOLIN 70/30) (70-30) 100 UNIT/ML injection Inject 10-50 Units into the skin See admin instructions. Inject 50 units into the skin before breakfast and 10-20 units at bedtime   Yes [provider]  isosorbide dinitrate (ISORDIL) 10 MG tablet TAKE 1 TABLET THREE TIMES DAILY 10/26/17  Yes Liane Comber, NP  Multiple Vitamins-Minerals (OCUVITE EYE HEALTH FORMULA PO) Take 1 tablet by mouth at bedtime.    Yes [provider]  oxyCODONE (ROXICODONE) 5 MG immediate release tablet Take 1 tablet (5 mg total) by mouth every 6 (six) hours as needed for severe pain. 11/11/17  Yes Rhyne, Hulen Shouts, PA-C  Polyethyl Glycol-Propyl Glycol (LUBRICANT EYE DROPS) 0.4-0.3 % SOLN Place 1-2 drops into both eyes 3 (three) times daily as needed (for dry eyes.).   Yes [provider]  sodium chloride (OCEAN) 0.65 % SOLN nasal spray Place 1 spray into both nostrils as needed for congestion.   Yes [provider]    clindamycin (CLEOCIN) 300 MG capsule Take 1 capsule (300 mg total) by mouth 3 (three) times daily. Patient not taking: Reported on 11/26/2017 09/28/17   [name], DPM  docusate sodium (COLACE) 100 MG capsule Take 100 mg by mouth at bedtime.    [provider]  fluticasone (FLONASE) 50 MCG/ACT nasal spray Place 2 sprays into both nostrils daily as needed for allergies or rhinitis.    [provider]  insulin aspart (NOVOLOG) 100 UNIT/ML FlexPen Inject 3 Units into the skin daily. With your largest meal of the day 11/05/17   Isabelle Course, MD  insulin glargine (LANTUS) 100 UNIT/ML injection Inject 0.05 mLs (5 Units total) into the skin daily. 11/06/17   Isabelle Course, MD  NON FORMULARY [employer] Tonic and Homeopathic Remedy: Mix 1 ounce with water and drink one to two times a day (5-6 times a week)    [provider]  Wound Dressings (MEDIHONEY WOUND/BURN DRESSING) GEL Apply to affected are 3 times a week, and cover with sterile dressing. Patient not taking: Reported on 11/26/2017 09/29/17   [name], DPM    Inpatient Medications: Scheduled Meds: . aspirin EC  81 mg Oral Daily  . atorvastatin  80 mg Oral q1800  . calcium acetate  667 mg Oral TID WC  . Chlorhexidine Gluconate Cloth  6 each Topical Q0600  . [START ON 12/04/2017] darbepoetin (ARANESP) injection - DIALYSIS  100 mcg Intravenous Q Fri-HD  . heparin injection (subcutaneous)  5,000 Units Subcutaneous Q8H  . hydrALAZINE  10 mg Oral Q8H  . insulin aspart  0-9 Units Subcutaneous TID WC  . isosorbide dinitrate  10 mg Oral TID  . pantoprazole  40 mg Oral Q1200  . [START ON 12/03/2017] polyethylene glycol  17 g Oral Daily  . [START ON 12/03/2017] senna-docusate  2 tablet Oral QHS  . sodium chloride flush  3 mL Intravenous Q12H   Continuous Infusions: . sodium chloride    . sodium chloride 10 mL/hr at 12/02/17 0622  . ferric gluconate (FERRLECIT/NULECIT) IV 125 mg (12/02/17 1000)   PRN  Meds: sodium chloride, acetaminophen **OR** acetaminophen, dextrose, heparin, lactulose, levalbuterol, MUSCLE RUB, ondansetron **OR** ondansetron (ZOFRAN) IV, sodium chloride flush  Allergies:    Allergies  Allergen Reactions  . Atenolol Other (See Comments)    Exacerbates gout  .  Adhesive [Tape] Other (See Comments)    SKIN IS VERY SENSITIVE AND BRUISES AND TEARS EASILY; PLEASE USE AN ALTERNATIVE TO TAPE!!  . Contrast Media [Iodinated Diagnostic Agents] Rash  . Latex Rash  . Omnipaque [Iohexol] Rash    Social History:   Social History   Socioeconomic History  . Marital status: Married    Spouse name: [name]  . Number of children: 1  . Years of education: Not on file  . Highest education level: Not on file  Occupational History  . Not on file  Social Needs  . Financial resource strain: Not on file  . Food insecurity:    Worry: Not on file    Inability: Not on file  . Transportation needs:    Medical: Not on file    Non-medical: Not on file  Tobacco Use  . Smoking status: Former Research scientist (life sciences)  . Smokeless tobacco: Never Used  Substance and Sexual Activity  . Alcohol use: Yes    Comment: rarely drinks wine  . Drug use: No  . Sexual activity: Not Currently  Lifestyle  . Physical activity:    Days per week: Not on file    Minutes per session: Not on file  . Stress: Not on file  Relationships  . Social connections:    Talks on phone: Not on file    Gets together: Not on file    Attends religious service: Not on file    Active member of club or organization: Not on file    Attends meetings of clubs or organizations: Not on file    Relationship status: Not on file  . Intimate partner violence:    Fear of current or ex partner: Not on file    Emotionally abused: Not on file    Physically abused: Not on file    Forced sexual activity: Not on file  Other Topics Concern  . Not on file  Social History Narrative   ** Merged History Encounter **        Family History:     Family History  Problem Relation Age of Onset  . Kidney cancer Brother 44  . Hypertension Brother   . Breast cancer Sister 45       LCIS; BRCA negative  . Throat cancer Father 55       smoker  . Breast cancer Sister 33       Lobular breast cancer  . Breast cancer Maternal Aunt        dx in her 83s     ROS:  Please see the history of present illness.   All other ROS reviewed and negative.     Physical Exam/Data:   Vitals:   12/02/17 1030 12/02/17 1100 12/02/17 1107 12/02/17 1214  BP: (!) 158/60 (!) 158/72 (!) 157/69 (!) 161/59  Pulse: 80 81 79 75  Resp: _0 Temp:   97.8 F (36.6 C) 98.5 F (36.9 C)  TempSrc:   Oral Oral  SpO2:   94% 95%  Weight:   77.7 kg   Height:        Intake/Output Summary (Last 24 hours) at 12/02/2017 1342 Last data filed at 12/02/2017 1107 Gross per 24 hour  Intake 1343.49 ml  Output 3501 ml  Net -2157.51 ml   Filed Weights   12/02/17 0450 12/02/17 0757 12/02/17 1107  Weight: 79.3 kg 79.6 kg 77.7 kg   Body mass index is 28.51 kg/m.  General:  Well nourished, well developed, in  no acute distress HEENT: normal Neck: no JVD Vascular: No carotid bruits Cardiac:  normal S1, S2; RRR; + murmur Lungs:  clear to auscultation bilaterally, no wheezing, rhonchi or rales  Abd: soft, nontender, no hepatomegaly  Ext: no edema Musculoskeletal:  No deformities, BUE and BLE strength normal and equal Skin: warm and dry  Neuro:  CNs 2-12 intact, no focal abnormalities noted Psych:  Normal affect   EKG:  The EKG was personally reviewed and demonstrates:  Sinus with LBBB Telemetry:  Telemetry was personally reviewed and demonstrates:  sinus  Relevant CV Studies:  TEE 12/01/17: Study Conclusions - Left ventricle: Hypertrophy was noted. Systolic function was   normal. The estimated ejection fraction was in the range of 50%   to 55%. Wall motion was normal; there were no regional wall   motion abnormalities. - Aortic valve: No evidence  of vegetation. - Mitral valve: Mildly calcified annulus. Mildly thickened leaflets   . There was moderate regurgitation. - Left atrium: The atrium was mildly dilated. No evidence of   thrombus in the atrial cavity or appendage. - Atrial septum: No defect or patent foramen ovale was identified. - Tricuspid valve: No evidence of vegetation. There was moderate   regurgitation. - Pulmonic valve: No evidence of vegetation.  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  Laboratory Data:  Chemistry Recent Labs  Lab 11/30/17 0544 12/01/17 0839 12/02/17 0454  NA 131* 136 137  K 2.8* 3.0* 3.3*  CL 93* 95* 95*  CO2 _0 GLUCOSE 188* 159* 167*  BUN 106* 68* 72*  CREATININE 4.20* 3.59* 4.12*  CALCIUM 8.6* 9.0 9.2  GFRNONAA 10* 12* 10*  GFRAA 11* 14* 12*  ANIONGAP _1 Recent Labs  Lab 11/26/17 1331 11/27/17 0614  11/30/17 0544 12/01/17 0839 12/02/17 0454  PROT 5.3* 4.9*  --   --   --   --   ALBUMIN 3.0* 2.8*   < > 3.1* 3.2* 3.1*  AST 34 24  --   --   --   --   ALT 40 34  --   --   --   --   ALKPHOS 107 101  --   --   --   --   BILITOT 0.8 0.7  --   --   --   --    < > = values in this interval not displayed.   Hematology Recent Labs  Lab 11/28/17 0457 11/30/17 0544 12/02/17 0454  WBC 7.2 7.4 7.8  RBC 3.48* 3.72* 3.64*  HGB 9.3* 9.8* 9.8*  HCT 30.1* 31.4* 31.6*  MCV 86.5 84.4 86.8  MCH 26.7 26.3 26.9  MCHC 30.9 31.2 31.0  RDW 16.9* 16.9* 17.8*  PLT 179 193 179   Cardiac EnzymesNo results for input(s): TROPONINI in the last 168 hours.  Recent Labs  Lab 11/26/17 1350  TROPIPOC 0.09*    BNP Recent Labs  Lab 11/26/17 1332  BNP 2,382.1*    DDimer No results for input(s): DDIMER in the last 168 hours.  Radiology/Studies:  Ir Cyndy Freeze Guide Cv Line Right  Result Date: 11/30/2017 INDICATION: 72  year old female with renal failure EXAM: TUNNELED CENTRAL VENOUS HEMODIALYSIS CATHETER PLACEMENT WITH ULTRASOUND AND FLUOROSCOPIC GUIDANCE MEDICATIONS: 2.0 g Ancef. The antibiotic was given in an appropriate time interval prior to skin puncture. ANESTHESIA/SEDATION: Moderate (conscious) sedation  was employed during this procedure. A total of Versed 1.0 mg and Fentanyl 100 mcg was administered intravenously. Moderate Sedation Time: 10 minutes. The patient's level of consciousness and vital signs were monitored continuously by radiology nursing throughout the procedure under my direct supervision. FLUOROSCOPY TIME:  Fluoroscopy Time: 0 minutes 30 seconds (2 mGy). COMPLICATIONS: None PROCEDURE: Informed written consent was obtained from the patient after a discussion of the risks, benefits, and alternatives to treatment. Questions regarding the procedure were encouraged and answered. The right neck and chest were prepped with chlorhexidine in a sterile fashion, and a sterile drape was applied covering the operative field. Maximum barrier sterile technique with sterile gowns and gloves were used for the procedure. A timeout was performed prior to the initiation of the procedure. After creating a small venotomy incision, a micropuncture kit was utilized to access the right internal jugular vein under direct, real-time ultrasound guidance after the overlying soft tissues were anesthetized with 1% lidocaine with epinephrine. Ultrasound image documentation was performed. The microwire was marked to measure appropriate internal catheter length. External tunneled length was estimated. A total tip to cuff length of 19 cm was selected. Skin and subcutaneous tissues of chest wall below the clavicle were generously infiltrated with 1% lidocaine for local anesthesia. A small stab incision was made with 11 blade scalpel. The selected hemodialysis catheter was tunneled in a retrograde fashion from the anterior chest wall to the  venotomy incision. A guidewire was advanced to the level of the IVC and the micropuncture sheath was exchanged for a peel-away sheath. The catheter was then placed through the peel-away sheath with tips ultimately positioned within the superior aspect of the right atrium. Final catheter positioning was confirmed and documented with a spot radiographic image. The catheter aspirates and flushes normally. The catheter was flushed with appropriate volume heparin dwells. The catheter exit site was secured with a 0-Prolene retention suture. The venotomy incision was closed Derma bond and sterile dressing. Dressings were applied at the chest wall. Patient tolerated the procedure well and remained hemodynamically stable throughout. No complications were encountered and no significant blood loss encountered. IMPRESSION: Status post right IJ tunneled hemodialysis catheter placement. Catheter ready for use. Signed, Dulcy Fanny. Dellia Nims, RPVI [facility] Radiology Electronically Signed   By: Corrie Mckusick D.O.   On: 11/30/2017 11:49   Dg Chest Port 1 View  Result Date: 11/30/2017 CLINICAL DATA:  72 year old female status post placement of a dialysis catheter. EXAM: PORTABLE CHEST 1 VIEW COMPARISON:  Intra procedural imaging obtained earlier today 11/30/2017 at 11:08 a.m. Most recent prior chest x-ray 11/26/2017 FINDINGS: Right IJ approach tunneled hemodialysis catheter. The tip of the catheter overlies the mid SVC. Stable cardiomegaly. Atherosclerotic calcifications are present in the transverse aorta. 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 There is a moderately large right-sided pleural effusion with associated right basilar atelectasis. This appears similar compared to prior imaging. No evidence of pneumothorax. No new pleural effusion. No acute osseous abnormality. IMPRESSION: 1. Right IJ tunneled hemodialysis catheter with the  tip overlying the mid SVC. No evidence of pneumothorax. 2. Stable moderately large right pleural effusion and associated basilar atelectasis. 3. Stable cardiomegaly. 4.  Aortic Atherosclerosis (ICD10-170.0) Electronically Signed   By: Jacqulynn Cadet M.D.   On: 11/30/2017 14:07    Assessment and Plan:   1. Mobile density on mitral valve, TTE 11/27/17 - found incidentally - confirmed with TEE on 12/01/17 - blood  cultures 11/28/17 NGTD - primary has low suspicion for endocarditis - no leukocytosis - will review echo with attending   2. Chronic systolic and diastolic heart failure, mitral valve regurgitation - weight is 171 lbs from 190 lbs this admission - she is overall net negative 13 L with 2.4 L urine output - HD today with 2L removed - will defer fluid balance to nephrology, as below   3. ESRD now on HD following acute on chronic kidney injury stage V - nephrology following - will defer fluid balance to HD - creatinine 4.12 today, K 3.3   4. HTN - pressures are elevated - consider increasing hydralazine to 25 mg ITD   For questions or updates, please contact [place] Please consult [email or web address] for contact info under Cardiology/STEMI.   Signed, Tami Lin Verania Salberg, PA  12/02/2017 1:42 PM

## 2017-12-02 NOTE — Procedures (Signed)
Patient seen on Hemodialysis. QB 300, UF goal 2L Treatment adjusted as needed.  Elmarie Shiley MD [facility]. Office # [phone] Pager # [phone] 8:38 AM

## 2017-12-02 NOTE — Progress Notes (Addendum)
Patient ID: [name], female   DOB: [birth year], 72 y.o.   MRN: [id number] [facility] Progress Note   Assessment/ Plan:   1.  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  Maturing right radiocephalic AVF placed on 06/08/1960.  Volume status clinically improving and without uremic symptoms.  Process underway for outpatient dialysis unit placement. 2.  CHF exacerbation: Improving volume status with hemodialysis/diuretic therapy-we will discontinue diuretics at this time. 3.  Anemia of chronic kidney disease: Ongoing intravenous iron replacement and initiation of ESA. 4.  Secondary hyperparathyroidism: Continue calcium acetate for phosphorus binding, PTH 234-at goal for chronic kidney disease stage V. 5.  Hypertension: Compounded by volume overload, anticipate to improve with ultrafiltration/dialysis. 6.  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  Clinically without evidence of SBE.  Subjective:   Reports to be feeling well and had a comfortable night of rest after getting Tylenol last night.   Objective:   BP (!) 152/64   Pulse 79   Temp 98.7 F (37.1 C) (Oral)   Resp 19   Ht '5\' 5"'  (1.651 m)   Wt 79.6 kg Comment: standing weight  SpO2 94%   BMI 29.20 kg/m   Intake/Output Summary (Last 24 hours) at 12/02/2017 1030 Last data filed at 12/02/2017 0700 Gross per 24 hour  Intake 1343.49 ml  Output 2401 ml  Net -1057.51 ml   Weight change: -6.1 kg  Physical Exam: Gen: Comfortably resting in hemodialysis CVS: Pulse regular rhythm, normal S1 and S2, no murmur Resp: Fine rales over both bases, no rhonchi Abd: Soft, obese, nontender Ext: 1+ lower extremity edema, right radiocephalic fistula with good thrill  Imaging: Ir Fluoro Guide Cv Line Right  Result Date: 11/30/2017 INDICATION: 72 year  old female with renal failure EXAM: TUNNELED CENTRAL VENOUS HEMODIALYSIS CATHETER PLACEMENT WITH ULTRASOUND AND FLUOROSCOPIC GUIDANCE MEDICATIONS: 2.0 g Ancef. The antibiotic was given in an appropriate time interval prior to skin puncture. ANESTHESIA/SEDATION: Moderate (conscious) sedation was employed during this procedure. A total of Versed 1.0 mg and Fentanyl 100 mcg was administered intravenously. Moderate Sedation Time: 10 minutes. The patient's level of consciousness and vital signs were monitored continuously by radiology nursing throughout the procedure under my direct supervision. FLUOROSCOPY TIME:  Fluoroscopy Time: 0 minutes 30 seconds (2 mGy). COMPLICATIONS: None PROCEDURE: Informed written consent was obtained from the patient after a discussion of the risks, benefits, and alternatives to treatment. Questions regarding the procedure were encouraged and answered. The right neck and chest were prepped with chlorhexidine in a sterile fashion, and a sterile drape was applied covering the operative field. Maximum barrier sterile technique with sterile gowns and gloves were used for the procedure. A timeout was performed prior to the initiation of the procedure. After creating a small venotomy incision, a micropuncture kit was utilized to access the right internal jugular vein under direct, real-time ultrasound guidance after the overlying soft tissues were anesthetized with 1% lidocaine with epinephrine. Ultrasound image documentation was performed. The microwire was marked to measure appropriate internal catheter length. External tunneled length was estimated. A total tip to cuff length of 19 cm was selected. Skin and subcutaneous tissues of chest wall below the clavicle were generously infiltrated with 1% lidocaine for local anesthesia. A small stab incision was made with 11 blade scalpel. The selected hemodialysis catheter was tunneled in a retrograde fashion from  the anterior chest wall to the venotomy  incision. A guidewire was advanced to the level of the IVC and the micropuncture sheath was exchanged for a peel-away sheath. The catheter was then placed through the peel-away sheath with tips ultimately positioned within the superior aspect of the right atrium. Final catheter positioning was confirmed and documented with a spot radiographic image. The catheter aspirates and flushes normally. The catheter was flushed with appropriate volume heparin dwells. The catheter exit site was secured with a 0-Prolene retention suture. The venotomy incision was closed Derma bond and sterile dressing. Dressings were applied at the chest wall. Patient tolerated the procedure well and remained hemodynamically stable throughout. No complications were encountered and no significant blood loss encountered. IMPRESSION: Status post right IJ tunneled hemodialysis catheter placement. Catheter ready for use. Signed, Dulcy Fanny. Dellia Nims, RPVI [facility] Radiology Electronically Signed   By: Corrie Mckusick D.O.   On: 11/30/2017 11:49   Dg Chest Port 1 View  Result Date: 11/30/2017 CLINICAL DATA:  72 year old female status post placement of a dialysis catheter. EXAM: PORTABLE CHEST 1 VIEW COMPARISON:  Intra procedural imaging obtained earlier today 11/30/2017 at 11:08 a.m. Most recent prior chest x-ray 11/26/2017 FINDINGS: Right IJ approach tunneled hemodialysis catheter. The tip of the catheter overlies the mid SVC. Stable cardiomegaly. Atherosclerotic calcifications are present in the transverse aorta. 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 There is a moderately large right-sided pleural effusion with associated right basilar atelectasis. This appears similar compared to prior imaging. No evidence of pneumothorax. No new pleural effusion. No acute osseous abnormality. IMPRESSION: 1. Right IJ tunneled hemodialysis catheter with the tip  overlying the mid SVC. No evidence of pneumothorax. 2. Stable moderately large right pleural effusion and associated basilar atelectasis. 3. Stable cardiomegaly. 4.  Aortic Atherosclerosis (ICD10-170.0) Electronically Signed   By: Jacqulynn Cadet M.D.   On: 11/30/2017 14:07    Labs: BMET Recent Labs  Lab 11/26/17 1331 11/26/17 1352 11/27/17 0340 11/28/17 0457 11/29/17 0644 11/30/17 0544 12/01/17 0839 12/02/17 0454  NA 140 137 136 134* 135 131* 136 137  K 4.2 4.0 4.1 4.2 3.8 2.8* 3.0* 3.3*  CL 107 104 102 101 96* 93* 95* 95*  CO2 22  --  '24 22 25 27 27 29  ' GLUCOSE 98 89 177* 211* 154* 188* 159* 167*  BUN 104* 104* 100* 107* 104* 106* 68* 72*  CREATININE 3.99* 4.10* 3.95* 3.93* 4.06* 4.20* 3.59* 4.12*  CALCIUM 8.7*  --  8.9 8.5* 8.9 8.6* 9.0 9.2  PHOS  --   --   --  7.7* 7.4* 6.4* 5.2* 5.4*   CBC Recent Labs  Lab 11/26/17 1331 11/26/17 1352 11/27/17 0614 11/28/17 0457 11/30/17 0544  WBC 7.8  --  7.3 7.2 7.4  NEUTROABS 6.0  --   --   --   --   HGB 10.4* 11.6* 9.3* 9.3* 9.8*  HCT 33.8* 34.0* 29.9* 30.1* 31.4*  MCV 88.3  --  85.9 86.5 84.4  PLT 180  --  186 179 193    Medications:    . aspirin EC  81 mg Oral Daily  . atorvastatin  80 mg Oral q1800  . calcium acetate  667 mg Oral TID WC  . Chlorhexidine Gluconate Cloth  6 each Topical Q0600  . [START ON 12/04/2017] darbepoetin (ARANESP) injection - DIALYSIS  100 mcg Intravenous Q Fri-HD  . heparin injection (subcutaneous)  5,000 Units Subcutaneous  Q8H  . hydrALAZINE  10 mg Oral Q8H  . insulin aspart  0-9 Units Subcutaneous TID WC  . isosorbide dinitrate  10 mg Oral TID  . metolazone  10 mg Oral Daily  . pantoprazole  40 mg Oral Q1200  . polyethylene glycol  17 g Oral BID  . senna-docusate  2 tablet Oral BID  . sodium chloride flush  3 mL Intravenous Q12H    Elmarie Shiley, MD 12/02/2017, 10:30 AM

## 2017-12-02 NOTE — Progress Notes (Signed)
CM attempted to talk to patient about HHC/ disposition needs, pt very sleeply; CM to follow up. Mindi Slicker [facility] [phone]

## 2017-12-02 NOTE — Progress Notes (Signed)
To the best of my knowledge, documentation by D Downs, [employer] nursing student, is correct. 

## 2017-12-02 NOTE — Progress Notes (Addendum)
PROGRESS NOTE        PATIENT DETAILS Name: [name] Age: 72 y.o. Sex: female Date of Birth: 10/27/1945 Admit Date: 11/26/2017 Admitting Physician Reyne Dumas, MD PTW:SFKCLEX, Gwyndolyn Saxon, MD  Brief Narrative: Patient is a 72 y.o. female with history of CKD stage IV, CAD status post CABG, chronic combined systolic and diastolic heart failure with anasarca in the setting of worsening renal function.  High-dose diuretics were started, no significant response-subsequently started on hemodialysis, now likely thought to have ESRD.   See below for further details  Subjective: Marked decrease in lower extremity edema following initiation of further hemodialysis.  No major events overnight.  Denies any chest pain or shortness of breath.   Assessment/Plan: Anasarca in the setting of worsening renal function (now ESRD) and decompensated diastolic heart failure: Volume status is markedly improved with initiation of hemodialysis.  As noted above, there was no response to high-dose diuretics and hence hemodialysis was started.  Nephrology following and directing care. TTE on 8/23 showed preserved EF.   Acute kidney injury on chronic kidney disease stage IV progression to ESRD: See above  Sinus bradycardia: stable after discontinuation of beta-blocker.  TSH within normal limits.  Avoid rate control agents.     Per H&P, patient was given atropine by EMS enroute to the ED.   Acute metabolic encephalopathy: Secondary to uremia-resolved.  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  Blood cultures on 8/24 are negative so far.  Clinical scenario is not suggestive of endocarditis.  I have formally consulted cardiology-awaiting further recommendations.    Hypokalemia: Replete and recheck.  Constipation: Resolved after enema, continue MiraLAX and senna.  CAD status post CABG in 2011: No anginal symptoms, continue  with aspirin.  Hypertension: Noted to be stable-continue with hydralazine and Imdur.  Await blood BP trend after a few HD sessions.     DM-2 with hypoglycemia: No further episodes of hypoglycemia, CBG stable with SSI.    Anemia: Secondary to acute illness and underlying CKD.  No evidence of acute blood loss.  Continue IV iron and Aranesp.  Follow CBC periodically.   History of breast cancer status post radiation and lumpectomy in 2015: Will need to resume anastrozole in the next few days.  DVT Prophylaxis: Prophylactic Heparin  Code Status: Full code   Family Communication: None at bedside  Disposition Plan: Remain inpatient-probably home later this week when outpatient dialysis center has been arranged.  Antimicrobial agents: Anti-infectives (From admission, onward)   Start     Dose/Rate Route Frequency Ordered Stop   11/30/17 0915  ceFAZolin (ANCEF) IVPB 2g/100 mL premix     2 g 200 mL/hr over 30 Minutes Intravenous  Once 11/30/17 0900 11/30/17 1121     Procedures: None  CONSULTS:  nephrology  Time spent: 25 minutes-Greater than 50% of this time was spent in counseling, explanation of diagnosis, planning of further management, and coordination of care.  MEDICATIONS: Scheduled Meds: . aspirin EC  81 mg Oral Daily  . atorvastatin  80 mg Oral q1800  . calcium acetate  667 mg Oral TID WC  . Chlorhexidine Gluconate Cloth  6 each Topical Q0600  . [START ON 12/04/2017] darbepoetin (ARANESP) injection - DIALYSIS  100 mcg Intravenous Q Fri-HD  . heparin injection (subcutaneous)  5,000 Units  Subcutaneous Q8H  . hydrALAZINE  10 mg Oral Q8H  . insulin aspart  0-9 Units Subcutaneous TID WC  . isosorbide dinitrate  10 mg Oral TID  . pantoprazole  40 mg Oral Q1200  . polyethylene glycol  17 g Oral BID  . senna-docusate  2 tablet Oral BID  . sodium chloride flush  3 mL Intravenous Q12H   Continuous Infusions: . sodium chloride    . sodium chloride 10 mL/hr at 12/02/17 0622    . ferric gluconate (FERRLECIT/NULECIT) IV 125 mg (12/02/17 1000)   PRN Meds:.sodium chloride, acetaminophen **OR** acetaminophen, dextrose, lactulose, levalbuterol, MUSCLE RUB, ondansetron **OR** ondansetron (ZOFRAN) IV, sodium chloride flush   PHYSICAL EXAM: Vital signs: Vitals:   12/02/17 1000 12/02/17 1030 12/02/17 1100 12/02/17 1107  BP: (!) 152/64 (!) 158/60 (!) 158/72 (!) 157/69  Pulse: 79 80 81 79  Resp: '19 19 20 20  ' Temp:    97.8 F (36.6 C)  TempSrc:    Oral  SpO2:    94%  Weight:      Height:       Filed Weights   12/01/17 1201 12/02/17 0450 12/02/17 0757  Weight: 76.5 kg 79.3 kg 79.6 kg   Body mass index is 29.2 kg/m.   General appearance:Awake, alert, not in any distress.  Eyes:no scleral icterus. HEENT: Atraumatic and Normocephalic Neck: supple, no JVD. Resp:Good air entry bilaterally,no rales or rhonchi CVS: S1 S2 regular, no murmurs.  GI: Bowel sounds present, Non tender and not distended with no gaurding, rigidity or rebound. Extremities: B/L Lower Ext shows + edema, both legs are warm to touch Neurology:  Non focal Psychiatric: Normal judgment and insight. Normal mood. Musculoskeletal:No digital cyanosis Skin:No Rash, warm and dry Wounds:N/A  I have personally reviewed following labs and imaging studies  LABORATORY DATA: CBC: Recent Labs  Lab 11/26/17 1331 11/26/17 1352 11/27/17 0614 11/28/17 0457 11/30/17 0544  WBC 7.8  --  7.3 7.2 7.4  NEUTROABS 6.0  --   --   --   --   HGB 10.4* 11.6* 9.3* 9.3* 9.8*  HCT 33.8* 34.0* 29.9* 30.1* 31.4*  MCV 88.3  --  85.9 86.5 84.4  PLT 180  --  186 179 782    Basic Metabolic Panel: Recent Labs  Lab 11/26/17 1331  11/28/17 0457 11/29/17 0644 11/30/17 0544 12/01/17 0839 12/02/17 0454  NA 140   < > 134* 135 131* 136 137  K 4.2   < > 4.2 3.8 2.8* 3.0* 3.3*  CL 107   < > 101 96* 93* 95* 95*  CO2 22   < > '22 25 27 27 29  ' GLUCOSE 98   < > 211* 154* 188* 159* 167*  BUN 104*   < > 107* 104* 106* 68*  72*  CREATININE 3.99*   < > 3.93* 4.06* 4.20* 3.59* 4.12*  CALCIUM 8.7*   < > 8.5* 8.9 8.6* 9.0 9.2  MG 2.7*  --   --   --   --   --   --   PHOS  --   --  7.7* 7.4* 6.4* 5.2* 5.4*   < > = values in this interval not displayed.    GFR: Estimated Creatinine Clearance: 13 mL/min (A) (by C-G formula based on SCr of 4.12 mg/dL (H)).  Liver Function Tests: Recent Labs  Lab 11/26/17 1331 11/27/17 9562 11/28/17 0457 11/29/17 0644 11/30/17 0544 12/01/17 0839 12/02/17 0454  AST 34 24  --   --   --   --   --  ALT 40 34  --   --   --   --   --   ALKPHOS 107 101  --   --   --   --   --   BILITOT 0.8 0.7  --   --   --   --   --   PROT 5.3* 4.9*  --   --   --   --   --   ALBUMIN 3.0* 2.8* 2.9* 3.0* 3.1* 3.2* 3.1*   No results for input(s): LIPASE, AMYLASE in the last 168 hours. Recent Labs  Lab 11/26/17 1903  AMMONIA 59*    Coagulation Profile: Recent Labs  Lab 11/26/17 1331 11/30/17 0544  INR 1.24 1.17    Cardiac Enzymes: No results for input(s): CKTOTAL, CKMB, CKMBINDEX, TROPONINI in the last 168 hours.  BNP (last 3 results) No results for input(s): PROBNP in the last 8760 hours.  HbA1C: No results for input(s): HGBA1C in the last 72 hours.  CBG: Recent Labs  Lab 12/01/17 0736 12/01/17 1117 12/01/17 1605 12/01/17 2104 12/02/17 0709  GLUCAP 155* 146* 158* 188* 183*    Lipid Profile: No results for input(s): CHOL, HDL, LDLCALC, TRIG, CHOLHDL, LDLDIRECT in the last 72 hours.  Thyroid Function Tests: No results for input(s): TSH, T4TOTAL, FREET4, T3FREE, THYROIDAB in the last 72 hours.  Anemia Panel: No results for input(s): VITAMINB12, FOLATE, FERRITIN, TIBC, IRON, RETICCTPCT in the last 72 hours.  Urine analysis:    Component Value Date/Time   COLORURINE STRAW (A) 11/26/2017 1818   APPEARANCEUR CLEAR 11/26/2017 1818   LABSPEC 1.008 11/26/2017 1818   PHURINE 5.0 11/26/2017 1818   GLUCOSEU NEGATIVE 11/26/2017 1818   HGBUR NEGATIVE 11/26/2017 1818    BILIRUBINUR NEGATIVE 11/26/2017 1818   KETONESUR NEGATIVE 11/26/2017 1818   PROTEINUR 30 (A) 11/26/2017 1818   UROBILINOGEN 0.2 05/30/2009 1500   NITRITE NEGATIVE 11/26/2017 1818   LEUKOCYTESUR NEGATIVE 11/26/2017 1818    Sepsis Labs: Lactic Acid, Venous    Component Value Date/Time   LATICACIDVEN 0.77 11/03/2017 1144    MICROBIOLOGY: Recent Results (from the past 240 hour(s))  Culture, blood (routine x 2)     Status: None (Preliminary result)   Collection Time: 11/28/17 10:46 AM  Result Value Ref Range Status   Specimen Description BLOOD LEFT ARM  Final   Special Requests   Final    BOTTLES DRAWN AEROBIC AND ANAEROBIC Blood Culture adequate volume   Culture   Final    NO GROWTH 4 DAYS Performed at [facility], [place] [address], [place], [place] [address]    Report Status PENDING  Incomplete  Culture, blood (routine x 2)     Status: None (Preliminary result)   Collection Time: 11/28/17 10:54 AM  Result Value Ref Range Status   Specimen Description BLOOD LEFT HAND  Final   Special Requests   Final    BOTTLES DRAWN AEROBIC AND ANAEROBIC Blood Culture adequate volume   Culture   Final    NO GROWTH 4 DAYS Performed at [facility], [place] [address], [place], [place] [address]    Report Status PENDING  Incomplete  Surgical pcr screen     Status: None   Collection Time: 11/30/17 12:27 AM  Result Value Ref Range Status   MRSA, PCR NEGATIVE NEGATIVE Final   Staphylococcus aureus NEGATIVE NEGATIVE Final    Comment: (NOTE) The Xpert SA Assay (FDA approved for NASAL specimens in patients 28 years of age and older), is  one component of a comprehensive surveillance program. It is not intended to diagnose infection nor to guide or monitor treatment. Performed at [facility], [place] [address] [place], [place] [address]     RADIOLOGY STUDIES/RESULTS: Dg Chest 2 View  Result Date: 11/03/2017 CLINICAL DATA:  Follow-up infiltrate EXAM: CHEST - 2 VIEW COMPARISON:   Earlier study of 11/03/2017 FINDINGS: Enlargement of cardiac silhouette post CABG. Atherosclerotic calcification aorta. Mediastinal contours normal. Slight pulmonary vascular congestion. Improved interstitial edema since the earlier study. Persistent RIGHT pleural effusion and basilar atelectasis. No pneumothorax. Bones demineralized. IMPRESSION: Enlargement of cardiac silhouette post CABG with pulmonary vascular congestion. Slightly improved pulmonary edema. Persistent RIGHT pleural effusion and basilar atelectasis. Electronically Signed   By: Lavonia Dana M.D.   On: 11/03/2017 18:52   Ct Head Wo Contrast  Result Date: 11/03/2017 CLINICAL DATA:  Fall.  Altered mental status. EXAM: CT HEAD WITHOUT CONTRAST TECHNIQUE: Contiguous axial images were obtained from the base of the skull through the vertex without intravenous contrast. COMPARISON:  None. FINDINGS: Brain: There is no evidence of acute infarct, intracranial hemorrhage, mass, midline shift, or extra-axial fluid collection. There is a small left cerebellar infarct with volume loss compatible with a chronic infarct. Bilateral basal ganglia lacunar infarcts are also most likely chronic. Patchy periventricular white matter hypodensities are nonspecific but compatible with mild chronic small vessel ischemic disease. Vascular: Calcified atherosclerosis at the skull base. No hyperdense vessel. Skull: No fracture or suspicious osseous lesion. Moderate-sized right frontal scalp hematoma with underlying prominent vascular channel in the frontal bone. Sinuses/Orbits: Chronic sphenoid sinusitis with sinus expansion, wall thickening and sclerosis, and complex and partially calcified central material in the sinus. Milder mucosal thickening elsewhere in the paranasal sinuses. Clear mastoid air cells. Bilateral cataract extraction. Other: None. IMPRESSION: 1. No evidence of acute intracranial abnormality. 2. Right frontal scalp hematoma.  No skull fracture. 3. Small basal  ganglia and cerebellar infarcts, likely chronic. 4. Mild chronic small vessel ischemic white matter disease. 5. Chronic sphenoid sinusitis. These results were called by telephone at the time of interpretation on 11/03/2017 at 9:16 am to Dr. Marda Stalker , who verbally acknowledged these results. Electronically Signed   By: Logan Bores M.D.   On: 11/03/2017 09:29   Ir Fluoro Guide Cv Line Right  Result Date: 11/30/2017 INDICATION: 72 year old female with renal failure EXAM: TUNNELED CENTRAL VENOUS HEMODIALYSIS CATHETER PLACEMENT WITH ULTRASOUND AND FLUOROSCOPIC GUIDANCE MEDICATIONS: 2.0 g Ancef. The antibiotic was given in an appropriate time interval prior to skin puncture. ANESTHESIA/SEDATION: Moderate (conscious) sedation was employed during this procedure. A total of Versed 1.0 mg and Fentanyl 100 mcg was administered intravenously. Moderate Sedation Time: 10 minutes. The patient's level of consciousness and vital signs were monitored continuously by radiology nursing throughout the procedure under my direct supervision. FLUOROSCOPY TIME:  Fluoroscopy Time: 0 minutes 30 seconds (2 mGy). COMPLICATIONS: None PROCEDURE: Informed written consent was obtained from the patient after a discussion of the risks, benefits, and alternatives to treatment. Questions regarding the procedure were encouraged and answered. The right neck and chest were prepped with chlorhexidine in a sterile fashion, and a sterile drape was applied covering the operative field. Maximum barrier sterile technique with sterile gowns and gloves were used for the procedure. A timeout was performed prior to the initiation of the procedure. After creating a small venotomy incision, a micropuncture kit was utilized to access the right internal jugular vein under direct, real-time ultrasound guidance after the overlying soft  tissues were anesthetized with 1% lidocaine with epinephrine. Ultrasound image documentation was performed. The microwire  was marked to measure appropriate internal catheter length. External tunneled length was estimated. A total tip to cuff length of 19 cm was selected. Skin and subcutaneous tissues of chest wall below the clavicle were generously infiltrated with 1% lidocaine for local anesthesia. A small stab incision was made with 11 blade scalpel. The selected hemodialysis catheter was tunneled in a retrograde fashion from the anterior chest wall to the venotomy incision. A guidewire was advanced to the level of the IVC and the micropuncture sheath was exchanged for a peel-away sheath. The catheter was then placed through the peel-away sheath with tips ultimately positioned within the superior aspect of the right atrium. Final catheter positioning was confirmed and documented with a spot radiographic image. The catheter aspirates and flushes normally. The catheter was flushed with appropriate volume heparin dwells. The catheter exit site was secured with a 0-Prolene retention suture. The venotomy incision was closed Derma bond and sterile dressing. Dressings were applied at the chest wall. Patient tolerated the procedure well and remained hemodynamically stable throughout. No complications were encountered and no significant blood loss encountered. IMPRESSION: Status post right IJ tunneled hemodialysis catheter placement. Catheter ready for use. Signed, Dulcy Fanny. Dellia Nims, RPVI [facility] Electronically Signed   By: Corrie Mckusick D.O.   On: 11/30/2017 11:49   Dg Chest Port 1 View  Result Date: 11/30/2017 CLINICAL DATA:  72 year old female status post placement of a dialysis catheter. EXAM: PORTABLE CHEST 1 VIEW COMPARISON:  Intra procedural imaging obtained earlier today 11/30/2017 at 11:08 a.m. Most recent prior chest x-ray 11/26/2017 FINDINGS: Right IJ approach tunneled hemodialysis catheter. The tip of the catheter overlies the mid SVC. Stable cardiomegaly.  Atherosclerotic calcifications are present in the transverse aorta. 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 There is a moderately large right-sided pleural effusion with associated right basilar atelectasis. This appears similar compared to prior imaging. No evidence of pneumothorax. No new pleural effusion. No acute osseous abnormality. IMPRESSION: 1. Right IJ tunneled hemodialysis catheter with the tip overlying the mid SVC. No evidence of pneumothorax. 2. Stable moderately large right pleural effusion and associated basilar atelectasis. 3. Stable cardiomegaly. 4.  Aortic Atherosclerosis (ICD10-170.0) Electronically Signed   By: Jacqulynn Cadet M.D.   On: 11/30/2017 14:07   Dg Chest Portable 1 View  Result Date: 11/26/2017 CLINICAL DATA:  Fall. EXAM: PORTABLE CHEST 1 VIEW COMPARISON:  11/03/2017. FINDINGS: Prior CABG. Cardiomegaly with pulmonary venous congestion, bilateral interstitial prominence and right-sided pleural effusion. Findings consistent with CHF. Findings have progressed from prior study of 11/03/2017. IMPRESSION: Prior CABG. Cardiomegaly with bilateral pulmonary infiltrates/edema and right-sided pleural effusion. Findings consistent with CHF. Findings have progressed from prior study of 11/03/2017. Electronically Signed   By: Marcello Moores  Register   On: 11/26/2017 13:55   Dg Chest Portable 1 View  Result Date: 11/03/2017 CLINICAL DATA:  Shortness of breath and recent fall, initial encounter EXAM: PORTABLE CHEST 1 VIEW COMPARISON:  None. FINDINGS: Cardiac shadow is enlarged. Aortic calcifications are again seen. The lungs are well aerated bilaterally with patchy infiltrate in the right lung base. No sizable effusion is noted. No bony abnormality is seen. IMPRESSION: Right basilar infiltrate. Followup PA and lateral chest X-ray is recommended in 3-4 weeks following trial of antibiotic therapy to ensure resolution and exclude underlying malignancy.  Electronically Signed   By: Inez Catalina  M.D.   On: 11/03/2017 08:48     LOS: 5 days   Oren Binet, MD  [facility]  If 7PM-7AM, please contact night-coverage  Please page via [email or web address] [email or web address]-click on MD name and type text message  12/02/2017, 11:41 AM

## 2017-12-03 LAB — RENAL FUNCTION PANEL
ALBUMIN: 3.2 g/dL — AB (ref 3.5–5.0)
Anion gap: 9 (ref 5–15)
BUN: 38 mg/dL — AB (ref 8–23)
CHLORIDE: 98 mmol/L (ref 98–111)
CO2: 31 mmol/L (ref 22–32)
CREATININE: 3.44 mg/dL — AB (ref 0.44–1.00)
Calcium: 9.1 mg/dL (ref 8.9–10.3)
GFR calc Af Amer: 14 mL/min — ABNORMAL LOW (ref >60)
GFR, EST NON AFRICAN AMERICAN: 12 mL/min — AB (ref >60)
Glucose, Bld: 227 mg/dL — ABNORMAL HIGH (ref 70–99)
Phosphorus: 3.7 mg/dL (ref 2.5–4.6)
Potassium: 3.6 mmol/L (ref 3.5–5.1)
SODIUM: 138 mmol/L (ref 135–145)

## 2017-12-03 LAB — CBC
HCT: 33.5 % — ABNORMAL LOW (ref 36.0–46.0)
Hemoglobin: 10.1 g/dL — ABNORMAL LOW (ref 12.0–15.0)
MCH: 26.6 pg (ref 26.0–34.0)
MCHC: 30.1 g/dL (ref 30.0–36.0)
MCV: 88.4 fL (ref 78.0–100.0)
PLATELETS: 162 10*3/uL (ref 150–400)
RBC: 3.79 MIL/uL — ABNORMAL LOW (ref 3.87–5.11)
RDW: 19 % — ABNORMAL HIGH (ref 11.5–15.5)
WBC: 7.2 10*3/uL (ref 4.0–10.5)

## 2017-12-03 LAB — GLUCOSE, CAPILLARY
GLUCOSE-CAPILLARY: 156 mg/dL — AB (ref 70–99)
GLUCOSE-CAPILLARY: 187 mg/dL — AB (ref 70–99)
Glucose-Capillary: 143 mg/dL — ABNORMAL HIGH (ref 70–99)
Glucose-Capillary: 122 mg/dL — ABNORMAL HIGH (ref 70–99)

## 2017-12-03 LAB — CULTURE, BLOOD (ROUTINE X 2)
Culture: NO GROWTH
Culture: NO GROWTH
SPECIAL REQUESTS: ADEQUATE
Special Requests: ADEQUATE

## 2017-12-03 MED ORDER — ACETAMINOPHEN 325 MG PO TABS
ORAL_TABLET | ORAL | Status: AC
  Filled 2017-12-03: qty 2

## 2017-12-03 MED ORDER — DARBEPOETIN ALFA 100 MCG/0.5ML IJ SOSY: 100.0000 ug | PREFILLED_SYRINGE | INTRAMUSCULAR | Status: DC

## 2017-12-03 NOTE — Progress Notes (Signed)
Patient ID: [name], female   DOB: [birth year], 72 y.o.   MRN: [id number] [facility] Progress Note   Assessment/ Plan:   1.  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  Maturing right radiocephalic AVF placed on 0/12/3265.  She continues to have clinical improvement with ongoing hemodialysis with regards to volume/uremic symptoms.  The plan is to continue hemodialysis here on a Tuesday/Thursday/Saturday schedule as we await outpatient dialysis unit placement. 2.  CHF exacerbation: Improving volume status with hemodialysis/ultrafiltration-appears to be nearing her dry weight. 3.  Anemia of chronic kidney disease: Status post intravenous iron replacement and currently on ESA. 4.  Secondary hyperparathyroidism: Continue calcium acetate for phosphorus binding, PTH 234-at goal for chronic kidney disease stage V. 5.  Hypertension: Compounded by volume overload, anticipate to improve with ultrafiltration/dialysis. 6.  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  Plans noted for outpatient cardiology follow-up.  Subjective:   Reports to be feeling well and able to ambulate with physical therapy.   Objective:   BP (!) 171/74   Pulse 83   Temp 98 F (36.7 C) (Oral)   Resp 20   Ht 5\' 5"  (1.651 m)   Wt 77.9 kg   SpO2 99%   BMI 28.58 kg/m   Intake/Output Summary (Last 24 hours) at 12/03/2017 1045 Last data filed at 12/03/2017 0834 Gross per 24 hour  Intake 800 ml  Output 2500 ml  Net -1700 ml   Weight change: 3.1 kg  Physical Exam: Gen: Comfortably resting in hemodialysis CVS: Pulse regular rhythm, normal S1 and S2, no murmur Resp: Fine rales over both bases, no rhonchi Abd: Soft, obese, nontender Ext: Trace lower extremity edema, right radiocephalic fistula with good thrill  Imaging: No results  found.  Labs: BMET Recent Labs  Lab 11/27/17 [id number] 11/28/17 0457 11/29/17 0644 11/30/17 0544 12/01/17 0839 12/02/17 0454 12/03/17 0930  NA 136 134* 135 131* 136 137 138  K 4.1 4.2 3.8 2.8* 3.0* 3.3* 3.6  CL 102 101 96* 93* 95* 95* 98  CO2 24 22 25 27 27 29 31   GLUCOSE 177* 211* 154* 188* 159* 167* 227*  BUN 100* 107* 104* 106* 68* 72* 38*  CREATININE 3.95* 3.93* 4.06* 4.20* 3.59* 4.12* 3.44*  CALCIUM 8.9 8.5* 8.9 8.6* 9.0 9.2 9.1  PHOS  --  7.7* 7.4* 6.4* 5.2* 5.4* 3.7   CBC Recent Labs  Lab 11/26/17 1331  11/28/17 0457 11/30/17 0544 12/02/17 0454 12/03/17 0931  WBC 7.8   < > 7.2 7.4 7.8 7.2  NEUTROABS 6.0  --   --   --   --   --   HGB 10.4*   < > 9.3* 9.8* 9.8* 10.1*  HCT 33.8*   < > 30.1* 31.4* 31.6* 33.5*  MCV 88.3   < > 86.5 84.4 86.8 88.4  PLT 180   < > 179 193 179 162   < > = values in this interval not displayed.    Medications:    . aspirin EC  81 mg Oral Daily  . atorvastatin  80 mg Oral q1800  . calcium acetate  667 mg Oral TID WC  . Chlorhexidine Gluconate Cloth  6 each Topical Q0600  . [START ON 12/04/2017] darbepoetin (ARANESP) injection - DIALYSIS  100 mcg Intravenous Q Fri-HD  . heparin injection (subcutaneous)  5,000 Units Subcutaneous Q8H  . hydrALAZINE  10 mg Oral Q8H  . insulin aspart  0-9 Units Subcutaneous TID WC  . isosorbide dinitrate  10 mg Oral TID  . pantoprazole  40 mg Oral Q1200  . polyethylene glycol  17 g Oral Daily  . senna-docusate  2 tablet Oral QHS  . sodium chloride flush  3 mL Intravenous Q12H    Elmarie Shiley, MD 12/03/2017, 10:45 AM

## 2017-12-03 NOTE — Procedures (Signed)
Patient seen on Hemodialysis. QB 350, UF goal 2L No complaints.   Elmarie Shiley MD [facility]. Office # [phone] Pager # [phone] 10:44 AM

## 2017-12-03 NOTE — Progress Notes (Signed)
PROGRESS NOTE        PATIENT DETAILS Name: [name] Age: 72 y.o. Sex: female Date of Birth: [birth year] Admit Date: 11/26/2017 Admitting Physician Reyne Dumas, MD [id number], Gwyndolyn Saxon, MD  Brief Narrative: Patient is a 72 y.o. female with history of CKD stage IV, CAD status post CABG, chronic combined systolic and diastolic heart failure with anasarca in the setting of worsening renal function.  High-dose diuretics were started, no significant response-subsequently started on hemodialysis, now likely thought to have ESRD.   See below for further details  Subjective: Lower extremity edema continues to improve.  No chest pain or shortness of breath.  Assessment/Plan: Anasarca in the setting of worsening renal function (now ESRD) and decompensated diastolic heart failure: Volume status has improved after initiation of hemodialysis.  As noted above, initially high-dose diuretics were started with no significant response.  TTE on 8/23 showed preserved EF.  Nephrology following, awaiting outpatient hemodialysis placement.  Acute kidney injury on chronic kidney disease stage IV progression to ESRD: See above  Sinus bradycardia: Stable after discontinuation of beta-blocker.  TSH within normal limits.  Avoid rate control agents.  Stable after discontinuation of beta-blocker.  Per H&P, patient was given atropine by EMS enroute to the ED.   Acute metabolic encephalopathy: Completely awake and alert, this is resolved.  Suspect lethargy, nausea was secondary to uremic encephalopathy.    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  Blood cultures on 8/24- so far.  Clinical scenario is not suggestive of endocarditis.,  Cardiology formally consulted on 8/28-if this density is thought to be a calcified subchondral apparatus.  Cardiology does not recommend any further work-up.  Hypokalemia:  Repleted.  Constipation: Resolved after enema, continue MiraLAX and senna.  CAD status post CABG in 2011: No anginal symptoms, continue with aspirin.  Hypertension: Pressure on the higher side-should improve with a few more HD sessions.  Continue with hydralazine and Imdur.  If no further improvement, we can contemplate adjusting antihypertensives, but would avoid rate control agents given severe bradycardia on admission.     DM-2 with hypoglycemia: CBG stable-No further episodes of hypoglycemia.  Anemia: Secondary to acute illness and underlying CKD.  No evidence of acute blood loss.  Continue IV iron and Aranesp per nephrology.  Follow CBC periodically.   History of breast cancer status post radiation and lumpectomy in 2015: Will need to resume anastrozole in the next few days.  DVT Prophylaxis: Prophylactic Heparin  Code Status: Full code   Family Communication: None at bedside  Disposition Plan: Remain inpatient-probably home once outpatient hemodialysis has been arranged.   Antimicrobial agents: Anti-infectives (From admission, onward)   Start     Dose/Rate Route Frequency Ordered Stop   11/30/17 0915  ceFAZolin (ANCEF) IVPB 2g/100 mL premix     2 g 200 mL/hr over 30 Minutes Intravenous  Once 11/30/17 0900 11/30/17 1121     Procedures: None  CONSULTS:  nephrology  Time spent: 25 minutes-Greater than 50% of this time was spent in counseling, explanation of diagnosis, planning of further management, and coordination of care.  MEDICATIONS: Scheduled Meds: . aspirin EC  81 mg Oral Daily  . atorvastatin  80 mg Oral q1800  . calcium acetate  667 mg Oral TID WC  . Chlorhexidine Gluconate Cloth  6 each Topical Q0600  . [START ON 12/05/2017] darbepoetin (ARANESP) injection - DIALYSIS  100 mcg Intravenous Q Sat-HD  . heparin injection (subcutaneous)  5,000 Units Subcutaneous Q8H  . hydrALAZINE  10 mg Oral Q8H  . insulin aspart  0-9 Units Subcutaneous TID WC  .  isosorbide dinitrate  10 mg Oral TID  . pantoprazole  40 mg Oral Q1200  . polyethylene glycol  17 g Oral Daily  . senna-docusate  2 tablet Oral QHS  . sodium chloride flush  3 mL Intravenous Q12H   Continuous Infusions: . sodium chloride    . sodium chloride 10 mL/hr at 12/02/17 0622  . ferric gluconate (FERRLECIT/NULECIT) IV 125 mg (12/02/17 1000)   PRN Meds:.sodium chloride, acetaminophen **OR** acetaminophen, dextrose, heparin, lactulose, levalbuterol, MUSCLE RUB, ondansetron **OR** ondansetron (ZOFRAN) IV, sodium chloride flush   PHYSICAL EXAM: Vital signs: Vitals:   12/03/17 1145 12/03/17 1215 12/03/17 1230 12/03/17 1357  BP: (!) 161/67 (!) 166/82 (!) 162/70 (!) 160/77  Pulse: 84 84 84 84  Resp:  (!) 22 (!) 24   Temp:   98.2 F (36.8 C) 98.3 F (36.8 C)  TempSrc:   Oral Oral  SpO2:   100% 95%  Weight:   77.9 kg   Height:       Filed Weights   12/03/17 0428 12/03/17 0845 12/03/17 1230  Weight: 77.4 kg 77.9 kg 77.9 kg   Body mass index is 28.58 kg/m.   General appearance:Awake, alert, not in any distress.  Eyes:no scleral icterus. HEENT: Atraumatic and Normocephalic Neck: supple, no JVD. Resp:Good air entry bilaterally,no rales or rhonchi CVS: S1 S2 regular, no murmurs.  GI: Bowel sounds present, Non tender and not distended with no gaurding, rigidity or rebound. Extremities: B/L Lower Ext shows + edema, both legs are warm to touch Neurology:  Non focal Psychiatric: Normal judgment and insight. Normal mood. Musculoskeletal:No digital cyanosis Skin:No Rash, warm and dry Wounds:N/A  I have personally reviewed following labs and imaging studies  LABORATORY DATA: CBC: Recent Labs  Lab 11/27/17 0614 11/28/17 0457 11/30/17 0544 12/02/17 0454 12/03/17 0931  WBC 7.3 7.2 7.4 7.8 7.2  HGB 9.3* 9.3* 9.8* 9.8* 10.1*  HCT 29.9* 30.1* 31.4* 31.6* 33.5*  MCV 85.9 86.5 84.4 86.8 88.4  PLT 186 179 193 179 702    Basic Metabolic Panel: Recent Labs  Lab  11/29/17 0644 11/30/17 0544 12/01/17 0839 12/02/17 0454 12/03/17 0930  NA 135 131* 136 137 138  K 3.8 2.8* 3.0* 3.3* 3.6  CL 96* 93* 95* 95* 98  CO2 '25 27 27 29 31  ' GLUCOSE 154* 188* 159* 167* 227*  BUN 104* 106* 68* 72* 38*  CREATININE 4.06* 4.20* 3.59* 4.12* 3.44*  CALCIUM 8.9 8.6* 9.0 9.2 9.1  PHOS 7.4* 6.4* 5.2* 5.4* 3.7    GFR: Estimated Creatinine Clearance: 15.5 mL/min (A) (by C-G formula based on SCr of 3.44 mg/dL (H)).  Liver Function Tests: Recent Labs  Lab 11/27/17 6378  11/29/17 0644 11/30/17 0544 12/01/17 0839 12/02/17 0454 12/03/17 0930  AST 24  --   --   --   --   --   --   ALT 34  --   --   --   --   --   --   ALKPHOS 101  --   --   --   --   --   --   BILITOT 0.7  --   --   --   --   --   --  PROT 4.9*  --   --   --   --   --   --   ALBUMIN 2.8*   < > 3.0* 3.1* 3.2* 3.1* 3.2*   < > = values in this interval not displayed.   No results for input(s): LIPASE, AMYLASE in the last 168 hours. Recent Labs  Lab 11/26/17 1903  AMMONIA 59*    Coagulation Profile: Recent Labs  Lab 11/30/17 0544  INR 1.17    Cardiac Enzymes: No results for input(s): CKTOTAL, CKMB, CKMBINDEX, TROPONINI in the last 168 hours.  BNP (last 3 results) No results for input(s): PROBNP in the last 8760 hours.  HbA1C: No results for input(s): HGBA1C in the last 72 hours.  CBG: Recent Labs  Lab 12/02/17 1211 12/02/17 1650 12/02/17 2112 12/03/17 0735 12/03/17 1356  GLUCAP 157* 169* 154* 143* 122*    Lipid Profile: No results for input(s): CHOL, HDL, LDLCALC, TRIG, CHOLHDL, LDLDIRECT in the last 72 hours.  Thyroid Function Tests: No results for input(s): TSH, T4TOTAL, FREET4, T3FREE, THYROIDAB in the last 72 hours.  Anemia Panel: No results for input(s): VITAMINB12, FOLATE, FERRITIN, TIBC, IRON, RETICCTPCT in the last 72 hours.  Urine analysis:    Component Value Date/Time   COLORURINE STRAW (A) 11/26/2017 1818   APPEARANCEUR CLEAR 11/26/2017 1818    LABSPEC 1.008 11/26/2017 1818   PHURINE 5.0 11/26/2017 1818   GLUCOSEU NEGATIVE 11/26/2017 1818   HGBUR NEGATIVE 11/26/2017 1818   BILIRUBINUR NEGATIVE 11/26/2017 1818   KETONESUR NEGATIVE 11/26/2017 1818   PROTEINUR 30 (A) 11/26/2017 1818   UROBILINOGEN 0.2 05/30/2009 1500   NITRITE NEGATIVE 11/26/2017 1818   LEUKOCYTESUR NEGATIVE 11/26/2017 1818    Sepsis Labs: Lactic Acid, Venous    Component Value Date/Time   LATICACIDVEN 0.77 11/03/2017 1144    MICROBIOLOGY: Recent Results (from the past 240 hour(s))  Culture, blood (routine x 2)     Status: None   Collection Time: 11/28/17 10:46 AM  Result Value Ref Range Status   Specimen Description BLOOD LEFT ARM  Final   Special Requests   Final    BOTTLES DRAWN AEROBIC AND ANAEROBIC Blood Culture adequate volume   Culture   Final    NO GROWTH 5 DAYS Performed at [facility], [place] [address], [place], [place] [address]    Report Status 12/03/2017 FINAL  Final  Culture, blood (routine x 2)     Status: None   Collection Time: 11/28/17 10:54 AM  Result Value Ref Range Status   Specimen Description BLOOD LEFT HAND  Final   Special Requests   Final    BOTTLES DRAWN AEROBIC AND ANAEROBIC Blood Culture adequate volume   Culture   Final    NO GROWTH 5 DAYS Performed at [facility], [place] [address] [place], [place] [address]    Report Status 12/03/2017 FINAL  Final  Surgical pcr screen     Status: None   Collection Time: 11/30/17 12:27 AM  Result Value Ref Range Status   MRSA, PCR NEGATIVE NEGATIVE Final   Staphylococcus aureus NEGATIVE NEGATIVE Final    Comment: (NOTE) The Xpert SA Assay (FDA approved for NASAL specimens in patients 72 years of age and older), is one component of a comprehensive surveillance program. It is not intended to diagnose infection nor to guide or monitor treatment. Performed at [facility], [place] [address], [place],  [address]     RADIOLOGY STUDIES/RESULTS: Dg Chest 2  View  Result  Date: 11/03/2017 CLINICAL DATA:  Follow-up infiltrate EXAM: CHEST - 2 VIEW COMPARISON:  Earlier study of 11/03/2017 FINDINGS: Enlargement of cardiac silhouette post CABG. Atherosclerotic calcification aorta. Mediastinal contours normal. Slight pulmonary vascular congestion. Improved interstitial edema since the earlier study. Persistent RIGHT pleural effusion and basilar atelectasis. No pneumothorax. Bones demineralized. IMPRESSION: Enlargement of cardiac silhouette post CABG with pulmonary vascular congestion. Slightly improved pulmonary edema. Persistent RIGHT pleural effusion and basilar atelectasis. Electronically Signed   By: Lavonia Dana M.D.   On: 11/03/2017 18:52   Ir Fluoro Guide Cv Line Right  Result Date: 11/30/2017 INDICATION: 72 year old female with renal failure EXAM: TUNNELED CENTRAL VENOUS HEMODIALYSIS CATHETER PLACEMENT WITH ULTRASOUND AND FLUOROSCOPIC GUIDANCE MEDICATIONS: 2.0 g Ancef. The antibiotic was given in an appropriate time interval prior to skin puncture. ANESTHESIA/SEDATION: Moderate (conscious) sedation was employed during this procedure. A total of Versed 1.0 mg and Fentanyl 100 mcg was administered intravenously. Moderate Sedation Time: 10 minutes. The patient's level of consciousness and vital signs were monitored continuously by radiology nursing throughout the procedure under my direct supervision. FLUOROSCOPY TIME:  Fluoroscopy Time: 0 minutes 30 seconds (2 mGy). COMPLICATIONS: None PROCEDURE: Informed written consent was obtained from the patient after a discussion of the risks, benefits, and alternatives to treatment. Questions regarding the procedure were encouraged and answered. The right neck and chest were prepped with chlorhexidine in a sterile fashion, and a sterile drape was applied covering the operative field. Maximum barrier sterile technique with sterile gowns and gloves were used for the procedure. A timeout was performed prior to the initiation of  the procedure. After creating a small venotomy incision, a micropuncture kit was utilized to access the right internal jugular vein under direct, real-time ultrasound guidance after the overlying soft tissues were anesthetized with 1% lidocaine with epinephrine. Ultrasound image documentation was performed. The microwire was marked to measure appropriate internal catheter length. External tunneled length was estimated. A total tip to cuff length of 19 cm was selected. Skin and subcutaneous tissues of chest wall below the clavicle were generously infiltrated with 1% lidocaine for local anesthesia. A small stab incision was made with 11 blade scalpel. The selected hemodialysis catheter was tunneled in a retrograde fashion from the anterior chest wall to the venotomy incision. A guidewire was advanced to the level of the IVC and the micropuncture sheath was exchanged for a peel-away sheath. The catheter was then placed through the peel-away sheath with tips ultimately positioned within the superior aspect of the right atrium. Final catheter positioning was confirmed and documented with a spot radiographic image. The catheter aspirates and flushes normally. The catheter was flushed with appropriate volume heparin dwells. The catheter exit site was secured with a 0-Prolene retention suture. The venotomy incision was closed Derma bond and sterile dressing. Dressings were applied at the chest wall. Patient tolerated the procedure well and remained hemodynamically stable throughout. No complications were encountered and no significant blood loss encountered. IMPRESSION: Status post right IJ tunneled hemodialysis catheter placement. Catheter ready for use. Signed, Dulcy Fanny. Dellia Nims, RPVI [facility] Radiology Electronically Signed   By: Corrie Mckusick D.O.   On: 11/30/2017 11:49   Dg Chest Port 1 View  Result Date: 11/30/2017 CLINICAL DATA:  72 year old female status post  placement of a dialysis catheter. EXAM: PORTABLE CHEST 1 VIEW COMPARISON:  Intra procedural imaging obtained earlier today 11/30/2017 at 11:08 a.m. Most recent prior chest x-ray 11/26/2017 FINDINGS: Right IJ approach tunneled hemodialysis catheter.  The tip of the catheter overlies the mid SVC. Stable cardiomegaly. Atherosclerotic calcifications are present in the transverse aorta. 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 There is a moderately large right-sided pleural effusion with associated right basilar atelectasis. This appears similar compared to prior imaging. No evidence of pneumothorax. No new pleural effusion. No acute osseous abnormality. IMPRESSION: 1. Right IJ tunneled hemodialysis catheter with the tip overlying the mid SVC. No evidence of pneumothorax. 2. Stable moderately large right pleural effusion and associated basilar atelectasis. 3. Stable cardiomegaly. 4.  Aortic Atherosclerosis (ICD10-170.0) Electronically Signed   By: Jacqulynn Cadet M.D.   On: 11/30/2017 14:07   Dg Chest Portable 1 View  Result Date: 11/26/2017 CLINICAL DATA:  Fall. EXAM: PORTABLE CHEST 1 VIEW COMPARISON:  11/03/2017. FINDINGS: Prior CABG. Cardiomegaly with pulmonary venous congestion, bilateral interstitial prominence and right-sided pleural effusion. Findings consistent with CHF. Findings have progressed from prior study of 11/03/2017. IMPRESSION: Prior CABG. Cardiomegaly with bilateral pulmonary infiltrates/edema and right-sided pleural effusion. Findings consistent with CHF. Findings have progressed from prior study of 11/03/2017. Electronically Signed   By: Marcello Moores  Register   On: 11/26/2017 13:55     LOS: 6 days   Oren Binet, MD  [facility]  If 7PM-7AM, please contact night-coverage  Please page via [email or web address] [email or web address]-click on MD name and type text message  12/03/2017, 2:00 PM

## 2017-12-03 NOTE — Progress Notes (Signed)
PT Cancellation Note  Patient Details Name: [name] MRN: [id number] DOB: [birth year]   Cancelled Treatment:    Reason Eval/Treat Not Completed: Patient at procedure or test/unavailable   [name] [name] 12/03/2017, 11:09 AM Elwyn Reach, [place]

## 2017-12-04 DIAGNOSIS — J810 Acute pulmonary edema: Secondary | ICD-10-CM

## 2017-12-04 DIAGNOSIS — Z992 Dependence on renal dialysis: Secondary | ICD-10-CM

## 2017-12-04 DIAGNOSIS — N186 End stage renal disease: Secondary | ICD-10-CM

## 2017-12-04 DIAGNOSIS — R531 Weakness: Secondary | ICD-10-CM

## 2017-12-04 DIAGNOSIS — R0602 Shortness of breath: Secondary | ICD-10-CM

## 2017-12-04 LAB — BASIC METABOLIC PANEL
ANION GAP: 9 (ref 5–15)
BUN: 20 mg/dL (ref 8–23)
CHLORIDE: 100 mmol/L (ref 98–111)
CO2: 31 mmol/L (ref 22–32)
CREATININE: 2.81 mg/dL — AB (ref 0.44–1.00)
Calcium: 9.1 mg/dL (ref 8.9–10.3)
GFR calc non Af Amer: 16 mL/min — ABNORMAL LOW (ref >60)
GFR, EST AFRICAN AMERICAN: 18 mL/min — AB (ref >60)
GLUCOSE: 166 mg/dL — AB (ref 70–99)
Potassium: 3.3 mmol/L — ABNORMAL LOW (ref 3.5–5.1)
Sodium: 140 mmol/L (ref 135–145)

## 2017-12-04 LAB — CBC
HCT: 32.9 % — ABNORMAL LOW (ref 36.0–46.0)
Hemoglobin: 10.1 g/dL — ABNORMAL LOW (ref 12.0–15.0)
MCH: 27.3 pg (ref 26.0–34.0)
MCHC: 30.7 g/dL (ref 30.0–36.0)
MCV: 88.9 fL (ref 78.0–100.0)
Platelets: 147 K/uL — ABNORMAL LOW (ref 150–400)
RBC: 3.7 MIL/uL — ABNORMAL LOW (ref 3.87–5.11)
RDW: 19.9 % — ABNORMAL HIGH (ref 11.5–15.5)
WBC: 7 K/uL (ref 4.0–10.5)

## 2017-12-04 LAB — GLUCOSE, CAPILLARY: Glucose-Capillary: 158 mg/dL — ABNORMAL HIGH (ref 70–99)

## 2017-12-04 MED ORDER — CALCIUM ACETATE (PHOS BINDER) 667 MG PO CAPS: 667.0000 mg | ORAL_CAPSULE | Freq: Three times a day (TID) | ORAL | 0 refills | Status: DC

## 2017-12-04 NOTE — Discharge Instructions (Signed)
Follow with Primary MD Unk Pinto, MD in 5 days   Get CBC, CMP, 2 view Chest X ray checked  by Primary MD in 5 days    Activity: As tolerated with Full fall precautions use walker/cane & assistance as needed  Disposition Home   Diet: Renal, low carb diet with 1.5 L strict total fluid restriction per day  Accuchecks 4 times/day, Once in AM empty stomach and then before each meal. Log in all results and show them to your Prim.MD in 3 days. If any glucose reading is under 80 or above 300 call your Prim MD immidiately. Follow Low glucose instructions for glucose under 80 as instructed.  Special Instructions: If you have smoked or chewed Tobacco  in the last 2 yrs please stop smoking, stop any regular Alcohol  and or any Recreational drug use.  On your next visit with your primary care physician please Get Medicines reviewed and adjusted.  Please request your Prim.MD to go over all Hospital Tests and Procedure/Radiological results at the follow up, please get all Hospital records sent to your Prim MD by signing hospital release before you go home.  If you experience worsening of your admission symptoms, develop shortness of breath, life threatening emergency, suicidal or homicidal thoughts you must seek medical attention immediately by calling 911 or calling your MD immediately  if symptoms less severe.  You Must read complete instructions/literature along with all the possible adverse reactions/side effects for all the Medicines you take and that have been prescribed to you. Take any new Medicines after you have completely understood and accpet all the possible adverse reactions/side effects.

## 2017-12-04 NOTE — Progress Notes (Signed)
Patient ID: [name], female   DOB: 06/03/1945, 72 y.o.   MRN: [id number] [facility] Progress Note   Assessment/ Plan:   1.  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  Process initiated for outpatient dialysis unit placement and currently on TTS schedule while in the hospital-dialysis ordered for tomorrow. 2.  CHF exacerbation: Improving volume status with hemodialysis/ultrafiltration-acute worsening RLS symptoms may be indicating an approach of her EDW (? cramps). 3.  Anemia of chronic kidney disease: Status post intravenous iron replacement and currently on ESA. 4.  Secondary hyperparathyroidism: Continue calcium acetate for phosphorus binding, PTH 234-at goal for chronic kidney disease stage V. 5.  Hypertension: Compounded by volume overload, anticipate to improve with ultrafiltration/dialysis. 6.  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  Plans noted for outpatient cardiology follow-up.  Subjective:   Reports some restless leg symptoms overnight as well as discomfort in her left arm/forearm from recent infiltration of IV.   Objective:   BP (!) 156/62   Pulse 80   Temp 98.2 F (36.8 C) (Oral)   Resp 18   Ht 5\' 5"  (1.651 m)   Wt 76.5 kg   SpO2 92%   BMI 28.06 kg/m   Intake/Output Summary (Last 24 hours) at 12/04/2017 0819 Last data filed at 12/04/2017 0432 Gross per 24 hour  Intake 720 ml  Output 1900 ml  Net -1180 ml   Weight change: -1.7 kg  Physical Exam: Gen: Comfortably resting in bed CVS: Pulse regular rhythm, normal S1 and S2, no murmur Resp: Fine rales over both bases, no rhonchi Abd: Soft, obese, nontender Ext: No lower extremity edema, right radiocephalic fistula with good thrill.  Mild swelling left forearm with  ecchymosis  Imaging: No results found.  Labs: BMET Recent Labs  Lab 11/28/17 0457 11/29/17 0644 11/30/17 0544 12/01/17 0839 12/02/17 0454 12/03/17 0930 12/04/17 0456  NA 134* 135 131* 136 137 138 140  K 4.2 3.8 2.8* 3.0* 3.3* 3.6 3.3*  CL 101 96* 93* 95* 95* 98 100  CO2 22 25 27 27 29 31 31   GLUCOSE 211* 154* 188* 159* 167* 227* 166*  BUN 107* 104* 106* 68* 72* 38* 20  CREATININE 3.93* 4.06* 4.20* 3.59* 4.12* 3.44* 2.81*  CALCIUM 8.5* 8.9 8.6* 9.0 9.2 9.1 9.1  PHOS 7.7* 7.4* 6.4* 5.2* 5.4* 3.7  --    CBC Recent Labs  Lab 11/30/17 0544 12/02/17 0454 12/03/17 0931 12/04/17 0456  WBC 7.4 7.8 7.2 7.0  HGB 9.8* 9.8* 10.1* 10.1*  HCT 31.4* 31.6* 33.5* 32.9*  MCV 84.4 86.8 88.4 88.9  PLT 193 179 162 147*    Medications:    . aspirin EC  81 mg Oral Daily  . atorvastatin  80 mg Oral q1800  . calcium acetate  667 mg Oral TID WC  . Chlorhexidine Gluconate Cloth  6 each Topical Q0600  . [START ON 12/05/2017] darbepoetin (ARANESP) injection - DIALYSIS  100 mcg Intravenous Q Sat-HD  . heparin injection (subcutaneous)  5,000 Units Subcutaneous Q8H  . hydrALAZINE  10 mg Oral Q8H  . insulin aspart  0-9 Units Subcutaneous TID WC  . isosorbide dinitrate  10 mg Oral TID  . pantoprazole  40 mg Oral Q1200  . polyethylene glycol  17 g Oral Daily  . senna-docusate  2 tablet Oral QHS  .  sodium chloride flush  3 mL Intravenous Q12H    Elmarie Shiley, MD 12/04/2017, 8:19 AM

## 2017-12-04 NOTE — Progress Notes (Addendum)
Physical Therapy Treatment Patient Details Name: [name] MRN: [id number] DOB: Nov 17, 1945 Today's Date: 12/04/2017    History of Present Illness 72yo female presenting to the ED with bradycardia, poor renal functoin, DOE, B LE edema. Per EKG, bigeminy with HR 30BPM. Diagnosed with acute on chronic CHF, bradycardia, acute on chronic renal dysfunction. PMH bradycardia, CHF, CKD, foot ulcers, HTN, L BBB, neuropathy, NSTEMI, PAD, DM, toe and ray amputations     PT Comments    Pt pleasant on arrival, reported has been increasing distance walked w/ nurses each day and had already walked this morning. Pt progressed ambulation to 500 ft w/ IV pole, min guard. Pt balance affected by shoe height discrepancy due to darco, encouraged to consider platform on R foot for equal leg heights to improve balance. Pt able to complete two stairs w/ heavy use of rail, will require assist/guard at home. Pt claims husband able to physically assist if needed.    Follow Up Recommendations  Home health PT     Equipment Recommendations  Rolling walker with 5" wheels;3in1 (PT)    Recommendations for Other Services       Precautions / Restrictions Precautions Precautions: Fall Required Braces or Orthoses: Other Brace/Splint(darco shoe L foot ) Restrictions Weight Bearing Restrictions: No    Mobility  Bed Mobility Overal bed mobility: Modified Independent Bed Mobility: Sit to Supine     Supine to sit: Modified independent (Device/Increase time)     General bed mobility comments: use if rail on L to power feet up over edge of bed   Transfers Overall transfer level: Needs assistance Equipment used: None Transfers: Sit to/from Stand Sit to Stand: Min guard         General transfer comment: use of rail on L for sit/stand, slight unsteady at initial stand, able to self-correct, min guard for safety, no phsyical assist   Ambulation/Gait Ambulation/Gait assistance: Min guard Gait Distance  (Feet): 500 Feet Assistive device: IV Pole Gait Pattern/deviations: Step-through pattern;Decreased step length - right;Decreased step length - left;Decreased stride length;Decreased dorsiflexion - right;Decreased dorsiflexion - left;Drifts right/left;Narrow base of support   Gait velocity interpretation: >2.62 ft/sec, indicative of community ambulatory General Gait Details: use of IV pole for comfort (uses tall walking stick at home), last 100 ft no IV pole. Min guard for safety, three standing rest breaks during gait. pt w/ limp due to shoe height discrepancy from darco, discussed platform shoe on other foot to help w/ balance. Pt reports toe of L LE gets caught on uneven surfaces.    Stairs Stairs: Yes Stairs assistance: Min guard Stair Management: One rail Right;Step to pattern Number of Stairs: 2 General stair comments: pt relies heavily on R rail to pull self up. decends sideways w/ L LE foot, trouble clearing R LE from L LE, gets tied up. pt 2 stairs at home w/ rail, husband able to assist    Wheelchair Mobility    Modified Rankin (Stroke Patients Only)       Balance Overall balance assessment: Modified Independent Sitting-balance support: Feet supported;Bilateral upper extremity supported Sitting balance-Leahy Scale: Good Sitting balance - Comments: able to lift feet, shift weight and maintain balance.    Standing balance support: No upper extremity supported;During functional activity Standing balance-Leahy Scale: Fair                              Cognition Arousal/Alertness: Awake/alert Behavior During Therapy: WFL for tasks assessed/performed  Overall Cognitive Status: Within Functional Limits for tasks assessed                                        Exercises      General Comments        Pertinent Vitals/Pain Pain Assessment: No/denies pain    Home Living                      Prior Function            PT Goals  (current goals can now be found in the care plan section) Progress towards PT goals: Progressing toward goals    Frequency    Min 3X/week      PT Plan Current plan remains appropriate    Co-evaluation              AM-PAC PT "6 Clicks" Daily Activity  Outcome Measure  Difficulty turning over in bed (including adjusting bedclothes, sheets and blankets)?: A Little Difficulty moving from lying on back to sitting on the side of the bed? : A Little Difficulty sitting down on and standing up from a chair with arms (e.g., wheelchair, bedside commode, etc,.)?: A Little Help needed moving to and from a bed to chair (including a wheelchair)?: A Little Help needed walking in hospital room?: A Little Help needed climbing 3-5 steps with a railing? : A Lot 6 Click Score: 17    End of Session Equipment Utilized During Treatment: Gait belt Activity Tolerance: Patient tolerated treatment well Patient left: in bed;with nursing/sitter in room;with call bell/phone within reach Nurse Communication: Mobility status PT Visit Diagnosis: Unsteadiness on feet (R26.81);Muscle weakness (generalized) (M62.81);Difficulty in walking, not elsewhere classified (R26.2);History of falling (Z91.81)     Time: 2774-1287 PT Time Calculation (min) (ACUTE ONLY): 27 min  Charges:  $Gait Training: 23-37 mins                     Samuella Bruin, [place]  [facility] [phone]    Samuella Bruin 12/04/2017, 10:17 AM

## 2017-12-04 NOTE — Progress Notes (Signed)
Pt ambulated the full length of the hallway and back to her room using the IV pole for balance. Will continue to monitor.

## 2017-12-04 NOTE — Discharge Summary (Signed)
[name] EOF:[id number] DOB: 01-31-1946 DOA: 11/26/2017  PCP: Unk Pinto, MD  Admit date: 11/26/2017  Discharge date: 12/04/2017  Admitted From: Home   Disposition:  Home   Recommendations for Outpatient Follow-up:   Follow up with PCP in 1-2 weeks  PCP Please obtain BMP/CBC, 2 view CXR in 1week,  (see Discharge instructions)   PCP Please follow up on the following pending results:    Home Health: RN, PT   Equipment/Devices: None  Consultations: Renal, Cards Discharge Condition: Fair   CODE STATUS: Full   Diet Recommendation: Low carb, renal.  1.5 L strict fluid restriction per day.  Chief Complaint  Patient presents with  . Bradycardia     Brief history of present illness from the day of admission and additional interim summary    Patient is a 72 y.o. female with history of CKD stage IV, CAD status post CABG, chronic combined systolic and diastolic heart failure with anasarca in the setting of worsening renal function.  High-dose diuretics were started, no significant response-subsequently started on hemodialysis, now likely thought to have ESRD.   See below for further details                                                                 [place] in the setting of worsening renal function (now ESRD) and decompensated on chronic diastolic heart failure: Volume status has improved after initiation of hemodialysis.  She failed oral diuretics.  TTE on 8/23 showed preserved EF.  Seen and followed by nephrology, I was called by nephrologist Dr. Posey Pronto today and informed that patient can be discharged, she has already been set up and has an appointment with an outpatient dialysis center.  Nephrology will inform her about the details.  Patient currently is symptom-free eager to go  home.  Acute kidney injury on chronic kidney disease stage IV progression to ESRD: See above  Sinus bradycardia: Stable after discontinuation of beta-blocker.  TSH within normal limits.  Avoid rate control agents.  Stable after discontinuation of beta-blocker.  Per H&P, patient was given atropine by EMS enroute to the ED. PCP to monitor blood pressure and adjust.  Acute metabolic encephalopathy: Completely awake and alert, this is resolved.  Suspect lethargy, nausea was secondary to uremic encephalopathy.    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  Blood cultures on 8/24- so far and clinically negative.  Clinical scenario is not suggestive of endocarditis.,  Cardiology formally consulted on 8/28-if this density is thought to be a calcified subchondral apparatus.  Cardiology does not recommend any further work-up.  Post discharge will follow with cardiology outpatient.  Hypokalemia: Repleted.  Constipation: Resolved after enema, continue MiraLAX and senna.  CAD status post CABG in 2011: No anginal symptoms, continue with aspirin.  Hypertension: Pressure on the higher side-should improve with a few more HD sessions.  Continue with hydralazine and Imdur.  PCP to monitor and can adjust and add Norvasc if needed.    DM-2 with hypoglycemia: CBG stable-No further episodes of hypoglycemia.  Anemia: Secondary to acute illness and underlying CKD.  No evidence of acute blood loss.  Continue IV iron and Aranesp per nephrology.  Follow CBC periodically in the outpatient setting by PCP.   History of breast cancer status post radiation and lumpectomy in 2015: Will need to resume anastrozole in the next few days.   Discharge diagnosis     Active Problems:   Hyperlipidemia   Essential hypertension   Congestive heart failure (HCC)   Type II diabetes mellitus with neurological manifestations, uncontrolled (HCC)   Chronic  kidney disease (CKD), active medical management without dialysis, stage 5 ([place])   Diabetes mellitus type 2, insulin dependent ([place])   Altered mental status   Coronary artery disease involving coronary bypass graft of native heart without angina pectoris   Bradycardia   Mitral valve mass   Cardiac mass   ESRD (end stage renal disease) (HCC)   SOB (shortness of breath)   Acute pulmonary edema (HCC)   Generalized weakness    Discharge instructions    Discharge Instructions    Discharge instructions   Complete by:  As directed    Follow with Primary MD Unk Pinto, MD in 5 days   Get CBC, CMP, 2 view Chest X ray checked  by Primary MD in 5 days    Activity: As tolerated with Full fall precautions use walker/cane & assistance as needed  Disposition Home   Diet: Renal, low carb diet with 1.5 L strict total fluid restriction per day  Accuchecks 4 times/day, Once in AM empty stomach and then before each meal. Log in all results and show them to your Prim.MD in 3 days. If any glucose reading is under 80 or above 300 call your Prim MD immidiately. Follow Low glucose instructions for glucose under 80 as instructed.  Special Instructions: If you have smoked or chewed Tobacco  in the last 2 yrs please stop smoking, stop any regular Alcohol  and or any Recreational drug use.  On your next visit with your primary care physician please Get Medicines reviewed and adjusted.  Please request your Prim.MD to go over all Hospital Tests and Procedure/Radiological results at the follow up, please get all Hospital records sent to your Prim MD by signing hospital release before you go home.  If you experience worsening of your admission symptoms, develop shortness of breath, life threatening emergency, suicidal or homicidal thoughts you must seek medical attention immediately by calling 911 or calling your MD immediately  if symptoms less severe.  You Must read complete instructions/literature  along with all the possible adverse reactions/side effects for all the Medicines you take and that have been prescribed to you. Take any new Medicines after you have completely understood and accpet all the possible adverse reactions/side effects.   Increase activity slowly   Complete by:  As directed       Discharge Medications   Allergies as of 12/04/2017      Reactions   Atenolol Other (See Comments)   Exacerbates gout   Adhesive [tape] Other (See Comments)   SKIN IS VERY SENSITIVE AND BRUISES AND TEARS EASILY; PLEASE USE AN ALTERNATIVE TO TAPE!!  Contrast Media [iodinated Diagnostic Agents] Rash   Latex Rash   Omnipaque [iohexol] Rash      Medication List    STOP taking these medications   bumetanide 2 MG tablet Commonly known as:  BUMEX   carvedilol 25 MG tablet Commonly known as:  COREG   clindamycin 300 MG capsule Commonly known as:  CLEOCIN   insulin glargine 100 UNIT/ML injection Commonly known as:  LANTUS     TAKE these medications   acetaminophen 500 MG tablet Commonly known as:  TYLENOL Take 1,000 mg by mouth every 6 (six) hours as needed for mild pain or headache.   allopurinol 100 MG tablet Commonly known as:  ZYLOPRIM Take 100 mg by mouth 2 (two) times daily.   anastrozole 1 MG tablet Commonly known as:  ARIMIDEX Take 1 tablet (1 mg total) by mouth daily.   aspirin EC 81 MG tablet Take 1 tablet (81 mg total) by mouth daily. What changed:  when to take this   atorvastatin 80 MG tablet Commonly known as:  LIPITOR Take 80 mg by mouth at bedtime. What changed:  Another medication with the same name was removed. Continue taking this medication, and follow the directions you see here.   calcium acetate 667 MG capsule Commonly known as:  PHOSLO Take 1 capsule (667 mg total) by mouth 3 (three) times daily with meals.   docusate sodium 100 MG capsule Commonly known as:  COLACE Take 100 mg by mouth at bedtime.   EXCEDRIN BACK & BODY 250-250 MG  tablet Generic drug:  Acetaminophen-Aspirin Buffered Take 1 tablet by mouth every 6 (six) hours as needed (for back pain).   fluticasone 50 MCG/ACT nasal spray Commonly known as:  FLONASE Place 2 sprays into both nostrils daily as needed for allergies or rhinitis.   hydrALAZINE 25 MG tablet Commonly known as:  APRESOLINE TAKE 3 TABLETS THREE TIMES DAILY What changed:  See the new instructions.   insulin aspart 100 UNIT/ML FlexPen Commonly known as:  NOVOLOG Inject 3 Units into the skin daily. With your largest meal of the day   insulin NPH-regular Human (70-30) 100 UNIT/ML injection Commonly known as:  NOVOLIN 70/30 Inject 10-50 Units into the skin See admin instructions. Inject 50 units into the skin before breakfast and 10-20 units at bedtime   isosorbide dinitrate 10 MG tablet Commonly known as:  ISORDIL TAKE 1 TABLET THREE TIMES DAILY   LUBRICANT EYE DROPS 0.4-0.3 % Soln Generic drug:  Polyethyl Glycol-Propyl Glycol Place 1-2 drops into both eyes 3 (three) times daily as needed (for dry eyes.).   MEDIHONEY WOUND/BURN DRESSING Gel Apply to affected are 3 times a week, and cover with sterile dressing.   NON FORMULARY [employer] Health Tonic and Homeopathic Remedy: Mix 1 ounce with water and drink one to two times a day (5-6 times a week)   OCUVITE EYE HEALTH FORMULA PO Take 1 tablet by mouth at bedtime.   oxyCODONE 5 MG immediate release tablet Commonly known as:  Oxy IR/ROXICODONE Take 1 tablet (5 mg total) by mouth every 6 (six) hours as needed for severe pain.   sodium chloride 0.65 % Soln nasal spray Commonly known as:  OCEAN Place 1 spray into both nostrils as needed for congestion.   Vitamin D3 2000 units Tabs Take 4,000 Units by mouth daily.       Follow-up Information    [facility] Follow up.   Specialty:  Home Health Services Why:  A home  health Physical Therapist will go to your home Contact information: [place] [address] [phone]        Unk Pinto, MD. Schedule an appointment as soon as possible for a visit in 5 day(s).   Specialty:  Internal Medicine Contact information: [address] [place] [address] [phone]        Josue Hector, MD .   Specialty:  Cardiology Contact information: [phone] [address] [place] [address] [phone]           Major procedures and Radiology Reports - PLEASE review detailed and final reports thoroughly  -         Ir Fluoro Guide Cv Line Right  Result Date: 11/30/2017 INDICATION: 72 year old female with renal failure EXAM: TUNNELED CENTRAL VENOUS HEMODIALYSIS CATHETER PLACEMENT WITH ULTRASOUND AND FLUOROSCOPIC GUIDANCE MEDICATIONS: 2.0 g Ancef. The antibiotic was given in an appropriate time interval prior to skin puncture. ANESTHESIA/SEDATION: Moderate (conscious) sedation was employed during this procedure. A total of Versed 1.0 mg and Fentanyl 100 mcg was administered intravenously. Moderate Sedation Time: 10 minutes. The patient's level of consciousness and vital signs were monitored continuously by radiology nursing throughout the procedure under my direct supervision. FLUOROSCOPY TIME:  Fluoroscopy Time: 0 minutes 30 seconds (2 mGy). COMPLICATIONS: None PROCEDURE: Informed written consent was obtained from the patient after a discussion of the risks, benefits, and alternatives to treatment. Questions regarding the procedure were encouraged and answered. The right neck and chest were prepped with chlorhexidine in a sterile fashion, and a sterile drape was applied covering the operative field. Maximum barrier sterile technique with sterile gowns and gloves were used for the procedure. A timeout was performed prior to the initiation of the procedure. After creating a small venotomy incision, a micropuncture kit was utilized to access the right internal jugular vein under direct, real-time ultrasound  guidance after the overlying soft tissues were anesthetized with 1% lidocaine with epinephrine. Ultrasound image documentation was performed. The microwire was marked to measure appropriate internal catheter length. External tunneled length was estimated. A total tip to cuff length of 19 cm was selected. Skin and subcutaneous tissues of chest wall below the clavicle were generously infiltrated with 1% lidocaine for local anesthesia. A small stab incision was made with 11 blade scalpel. The selected hemodialysis catheter was tunneled in a retrograde fashion from the anterior chest wall to the venotomy incision. A guidewire was advanced to the level of the IVC and the micropuncture sheath was exchanged for a peel-away sheath. The catheter was then placed through the peel-away sheath with tips ultimately positioned within the superior aspect of the right atrium. Final catheter positioning was confirmed and documented with a spot radiographic image. The catheter aspirates and flushes normally. The catheter was flushed with appropriate volume heparin dwells. The catheter exit site was secured with a 0-Prolene retention suture. The venotomy incision was closed Derma bond and sterile dressing. Dressings were applied at the chest wall. Patient tolerated the procedure well and remained hemodynamically stable throughout. No complications were encountered and no significant blood loss encountered. IMPRESSION: Status post right IJ tunneled hemodialysis catheter placement. Catheter ready for use. Signed, Dulcy Fanny. Dellia Nims, RPVI [facility] Electronically Signed   By: Corrie Mckusick D.O.   On: 11/30/2017 11:49   Dg Chest Port 1 View  Result Date: 11/30/2017 CLINICAL DATA:  72 year old female status post placement of a dialysis  catheter. EXAM: PORTABLE CHEST 1 VIEW COMPARISON:  Intra procedural imaging obtained earlier today 11/30/2017 at 11:08 a.m. Most recent prior  chest x-ray 11/26/2017 FINDINGS: Right IJ approach tunneled hemodialysis catheter. The tip of the catheter overlies the mid SVC. Stable cardiomegaly. Atherosclerotic calcifications are present in the transverse aorta. 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 There is a moderately large right-sided pleural effusion with associated right basilar atelectasis. This appears similar compared to prior imaging. No evidence of pneumothorax. No new pleural effusion. No acute osseous abnormality. IMPRESSION: 1. Right IJ tunneled hemodialysis catheter with the tip overlying the mid SVC. No evidence of pneumothorax. 2. Stable moderately large right pleural effusion and associated basilar atelectasis. 3. Stable cardiomegaly. 4.  Aortic Atherosclerosis (ICD10-170.0) Electronically Signed   By: Jacqulynn Cadet M.D.   On: 11/30/2017 14:07   Dg Chest Portable 1 View  Result Date: 11/26/2017 CLINICAL DATA:  Fall. EXAM: PORTABLE CHEST 1 VIEW COMPARISON:  11/03/2017. FINDINGS: Prior CABG. Cardiomegaly with pulmonary venous congestion, bilateral interstitial prominence and right-sided pleural effusion. Findings consistent with CHF. Findings have progressed from prior study of 11/03/2017. IMPRESSION: Prior CABG. Cardiomegaly with bilateral pulmonary infiltrates/edema and right-sided pleural effusion. Findings consistent with CHF. Findings have progressed from prior study of 11/03/2017. Electronically Signed   By: Marcello Moores  Register   On: 11/26/2017 13:55    Micro Results     Recent Results (from the past 240 hour(s))  Culture, blood (routine x 2)     Status: None   Collection Time: 11/28/17 10:46 AM  Result Value Ref Range Status   Specimen Description BLOOD LEFT ARM  Final   Special Requests   Final    BOTTLES DRAWN AEROBIC AND ANAEROBIC Blood Culture adequate volume   Culture   Final    NO GROWTH 5 DAYS Performed at [facility], [address], [place], [place]  [address]    Report Status 12/03/2017 FINAL  Final  Culture, blood (routine x 2)     Status: None   Collection Time: 11/28/17 10:54 AM  Result Value Ref Range Status   Specimen Description BLOOD LEFT HAND  Final   Special Requests   Final    BOTTLES DRAWN AEROBIC AND ANAEROBIC Blood Culture adequate volume   Culture   Final    NO GROWTH 5 DAYS Performed at [facility], [place] [address], [place], [place] [address]    Report Status 12/03/2017 FINAL  Final  Surgical pcr screen     Status: None   Collection Time: 11/30/17 12:27 AM  Result Value Ref Range Status   MRSA, PCR NEGATIVE NEGATIVE Final   Staphylococcus aureus NEGATIVE NEGATIVE Final    Comment: (NOTE) The Xpert SA Assay (FDA approved for NASAL specimens in patients 46 years of age and older), is one component of a comprehensive surveillance program. It is not intended to diagnose infection nor to guide or monitor treatment. Performed at [facility], [place] [address] [place], [place] [address]     Today   Subjective    Veronnica Hennings today has no headache,no chest abdominal pain,no new weakness tingling or numbness, feels much better wants to go home today.     Objective   Blood pressure (!) 162/64, pulse 87, temperature 98.2 F (36.8 C), temperature source Oral, resp. rate 18, height '5\' 5"'  (1.651 m), weight 76.5 kg, SpO2 92 %.   Intake/Output Summary (Last 24 hours) at 12/04/2017 1205 Last data  filed at 12/04/2017 0918 Gross per 24 hour  Intake 840 ml  Output 1800 ml  Net -960 ml    Exam Awake Alert, Oriented x 3, No new F.N deficits, Normal affect [place].AT,PERRAL Supple Neck,No JVD, No cervical lymphadenopathy appriciated.  Symmetrical Chest wall movement, Good air movement bilaterally, CTAB RRR,No Gallops,Rubs or new Murmurs, No Parasternal Heave +ve B.Sounds, Abd Soft, Non tender, No organomegaly appriciated, No rebound -guarding or rigidity. No Cyanosis, Clubbing or edema, No new Rash or bruise    Data Review   CBC w Diff:  Lab Results  Component Value Date   WBC 7.0 12/04/2017   HGB 10.1 (L) 12/04/2017   HGB 11.7 05/18/2017   HGB 12.3 02/02/2017   HCT 32.9 (L) 12/04/2017   HCT 34.7 05/18/2017   HCT 37.4 02/02/2017   PLT 147 (L) 12/04/2017   PLT 227 05/18/2017   LYMPHOPCT 12 11/26/2017   LYMPHOPCT 18.8 02/02/2017   MONOPCT 10 11/26/2017   MONOPCT 8.3 02/02/2017   EOSPCT 1 11/26/2017   EOSPCT 2.7 02/02/2017   BASOPCT 0 11/26/2017   BASOPCT 1.0 02/02/2017    CMP:  Lab Results  Component Value Date   NA 140 12/04/2017   NA 143 05/18/2017   NA 143 02/02/2017   K 3.3 (L) 12/04/2017   K 3.8 02/02/2017   CL 100 12/04/2017   CO2 31 12/04/2017   CO2 24 02/02/2017   BUN 20 12/04/2017   BUN 76 (HH) 05/18/2017   BUN 63.4 (H) 02/02/2017   CREATININE 2.81 (H) 12/04/2017   CREATININE 4.37 (H) 08/04/2017   CREATININE 3.0 (HH) 02/02/2017   PROT 4.9 (L) 11/27/2017   PROT 7.1 02/02/2017   ALBUMIN 3.2 (L) 12/03/2017   ALBUMIN 3.6 02/02/2017   BILITOT 0.7 11/27/2017   BILITOT 0.31 02/02/2017   ALKPHOS 101 11/27/2017   ALKPHOS 77 02/02/2017   AST 24 11/27/2017   AST 23 02/02/2017   ALT 34 11/27/2017   ALT 18 02/02/2017  .   Total Time in preparing paper work, data evaluation and todays exam - 32 minutes  Lala Lund M.D on 12/04/2017 at 12:05 PM  [facility]   Office  [phone]

## 2017-12-05 DIAGNOSIS — N186 End stage renal disease: Secondary | ICD-10-CM | POA: Diagnosis not present

## 2017-12-05 DIAGNOSIS — E876 Hypokalemia: Secondary | ICD-10-CM | POA: Diagnosis not present

## 2017-12-05 DIAGNOSIS — N2581 Secondary hyperparathyroidism of renal origin: Secondary | ICD-10-CM | POA: Diagnosis not present

## 2017-12-05 DIAGNOSIS — E1129 Type 2 diabetes mellitus with other diabetic kidney complication: Secondary | ICD-10-CM | POA: Diagnosis not present

## 2017-12-05 DIAGNOSIS — D631 Anemia in chronic kidney disease: Secondary | ICD-10-CM | POA: Diagnosis not present

## 2017-12-08 DIAGNOSIS — N186 End stage renal disease: Secondary | ICD-10-CM | POA: Diagnosis not present

## 2017-12-08 DIAGNOSIS — Z23 Encounter for immunization: Secondary | ICD-10-CM | POA: Diagnosis not present

## 2017-12-08 DIAGNOSIS — E1129 Type 2 diabetes mellitus with other diabetic kidney complication: Secondary | ICD-10-CM | POA: Diagnosis not present

## 2017-12-08 DIAGNOSIS — D631 Anemia in chronic kidney disease: Secondary | ICD-10-CM | POA: Diagnosis not present

## 2017-12-08 DIAGNOSIS — N2581 Secondary hyperparathyroidism of renal origin: Secondary | ICD-10-CM | POA: Diagnosis not present

## 2017-12-08 DIAGNOSIS — E876 Hypokalemia: Secondary | ICD-10-CM | POA: Diagnosis not present

## 2017-12-09 ENCOUNTER — Ambulatory Visit: Payer: Self-pay | Admitting: Internal Medicine

## 2017-12-10 ENCOUNTER — Other Ambulatory Visit: Payer: Self-pay

## 2017-12-10 DIAGNOSIS — E1129 Type 2 diabetes mellitus with other diabetic kidney complication: Secondary | ICD-10-CM | POA: Diagnosis not present

## 2017-12-10 DIAGNOSIS — Z23 Encounter for immunization: Secondary | ICD-10-CM | POA: Diagnosis not present

## 2017-12-10 DIAGNOSIS — N186 End stage renal disease: Secondary | ICD-10-CM | POA: Diagnosis not present

## 2017-12-10 DIAGNOSIS — N2581 Secondary hyperparathyroidism of renal origin: Secondary | ICD-10-CM | POA: Diagnosis not present

## 2017-12-10 DIAGNOSIS — D631 Anemia in chronic kidney disease: Secondary | ICD-10-CM | POA: Diagnosis not present

## 2017-12-10 DIAGNOSIS — E876 Hypokalemia: Secondary | ICD-10-CM | POA: Diagnosis not present

## 2017-12-10 NOTE — Patient Outreach (Signed)
[facility]) Care Management  12/10/2017  [name] [facility] Nov 16, 1945 [id number]     EMMI-HF RED ON EMMI ALERT Day # 5 Date: 12/09/17 Red Alert Reason: "weighed themselves today? No"   Outreach attempt # 1 to patient. Spoke with patient. She reports that she is doing well. She denies any acute needs or concerns. Reviewed and addressed red alert with patient. She stets that she normally wakes up around 4-5am and weighs herself as part of her morning routine. However, on yesterday she got distracted and forgot to weigh herself. She voices that weight this morning as 164.9 lbs. She denies any issues with swelling, SOB or other symptoms. She reports that she is going to dialysis on Tues,Thurs and Sat in the afternoon. She states that everything is going well with that except its uncomfortable for her back to sit that long. Patient has made her follow up appts. No issues regarding transportation and meds. She voices that [facility] has contacted her and will be out to visit her on Sunday. She denies any further RN CM needs or concerns at this time. Patient familiar with EMMI-HF calls as she has had them in the past.  Advised patient that they would continue to get automated EMMI- HF post discharge calls to assess how they are doing following recent hospitalization and will receive a call from a nurse if any of their responses were abnormal. Patient voiced understanding and was appreciative of f/u call.     Plan: RN CM will close case as no further interventions needed at this time.    Enzo Montgomery, RN,BSN,CCM [place] Management Telephonic Care Management Coordinator Direct Phone: [phone] Toll Free: [phone] Fax: [phone]

## 2017-12-11 ENCOUNTER — Other Ambulatory Visit: Payer: Self-pay

## 2017-12-11 NOTE — Patient Outreach (Signed)
[facility]) Care Management  12/11/2017  Sheza Strickland [facility] 21-Oct-1945 [id number]     EMMI-HF RED ON EMMI ALERT Day # 6 Date: 12/10/17 Red Alert Reason: "Lost interest in things they used to enjoy? Yes"   Red on EMMI dashboard alert received. No outreach warranted to patient at this time. RN CM spoke with patient at length via phone on yesterday. Patient was able to verbalize that she has been very busy with appts, therapy and dialysis which has not allowed her to be able to do the "normal" things that she was doing prior. No further RN CM interventions needed at this time.       Plan: RN CM will close case at this time.    Enzo Montgomery, RN,BSN,CCM [place] Management Telephonic Care Management Coordinator Direct Phone: [phone] Toll Free: [phone] Fax: [phone]

## 2017-12-12 DIAGNOSIS — E1129 Type 2 diabetes mellitus with other diabetic kidney complication: Secondary | ICD-10-CM | POA: Diagnosis not present

## 2017-12-12 DIAGNOSIS — N2581 Secondary hyperparathyroidism of renal origin: Secondary | ICD-10-CM | POA: Diagnosis not present

## 2017-12-12 DIAGNOSIS — N186 End stage renal disease: Secondary | ICD-10-CM | POA: Diagnosis not present

## 2017-12-12 DIAGNOSIS — D631 Anemia in chronic kidney disease: Secondary | ICD-10-CM | POA: Diagnosis not present

## 2017-12-12 DIAGNOSIS — Z23 Encounter for immunization: Secondary | ICD-10-CM | POA: Diagnosis not present

## 2017-12-12 DIAGNOSIS — E876 Hypokalemia: Secondary | ICD-10-CM | POA: Diagnosis not present

## 2017-12-13 DIAGNOSIS — E559 Vitamin D deficiency, unspecified: Secondary | ICD-10-CM | POA: Diagnosis not present

## 2017-12-13 DIAGNOSIS — Z794 Long term (current) use of insulin: Secondary | ICD-10-CM | POA: Diagnosis not present

## 2017-12-13 DIAGNOSIS — N186 End stage renal disease: Secondary | ICD-10-CM | POA: Diagnosis not present

## 2017-12-13 DIAGNOSIS — I2510 Atherosclerotic heart disease of native coronary artery without angina pectoris: Secondary | ICD-10-CM | POA: Diagnosis not present

## 2017-12-13 DIAGNOSIS — Z951 Presence of aortocoronary bypass graft: Secondary | ICD-10-CM | POA: Diagnosis not present

## 2017-12-13 DIAGNOSIS — J45909 Unspecified asthma, uncomplicated: Secondary | ICD-10-CM | POA: Diagnosis not present

## 2017-12-13 DIAGNOSIS — I059 Rheumatic mitral valve disease, unspecified: Secondary | ICD-10-CM | POA: Diagnosis not present

## 2017-12-13 DIAGNOSIS — J439 Emphysema, unspecified: Secondary | ICD-10-CM | POA: Diagnosis not present

## 2017-12-13 DIAGNOSIS — D631 Anemia in chronic kidney disease: Secondary | ICD-10-CM | POA: Diagnosis not present

## 2017-12-13 DIAGNOSIS — I5042 Chronic combined systolic (congestive) and diastolic (congestive) heart failure: Secondary | ICD-10-CM | POA: Diagnosis not present

## 2017-12-13 DIAGNOSIS — Z7982 Long term (current) use of aspirin: Secondary | ICD-10-CM | POA: Diagnosis not present

## 2017-12-13 DIAGNOSIS — Z87891 Personal history of nicotine dependence: Secondary | ICD-10-CM | POA: Diagnosis not present

## 2017-12-13 DIAGNOSIS — I5189 Other ill-defined heart diseases: Secondary | ICD-10-CM | POA: Diagnosis not present

## 2017-12-13 DIAGNOSIS — Z853 Personal history of malignant neoplasm of breast: Secondary | ICD-10-CM | POA: Diagnosis not present

## 2017-12-13 DIAGNOSIS — Z992 Dependence on renal dialysis: Secondary | ICD-10-CM | POA: Diagnosis not present

## 2017-12-13 DIAGNOSIS — Z9181 History of falling: Secondary | ICD-10-CM | POA: Diagnosis not present

## 2017-12-13 DIAGNOSIS — M109 Gout, unspecified: Secondary | ICD-10-CM | POA: Diagnosis not present

## 2017-12-13 DIAGNOSIS — E1151 Type 2 diabetes mellitus with diabetic peripheral angiopathy without gangrene: Secondary | ICD-10-CM | POA: Diagnosis not present

## 2017-12-13 DIAGNOSIS — I132 Hypertensive heart and chronic kidney disease with heart failure and with stage 5 chronic kidney disease, or end stage renal disease: Secondary | ICD-10-CM | POA: Diagnosis not present

## 2017-12-13 DIAGNOSIS — E1140 Type 2 diabetes mellitus with diabetic neuropathy, unspecified: Secondary | ICD-10-CM | POA: Diagnosis not present

## 2017-12-13 DIAGNOSIS — M1991 Primary osteoarthritis, unspecified site: Secondary | ICD-10-CM | POA: Diagnosis not present

## 2017-12-13 DIAGNOSIS — I447 Left bundle-branch block, unspecified: Secondary | ICD-10-CM | POA: Diagnosis not present

## 2017-12-13 DIAGNOSIS — E1122 Type 2 diabetes mellitus with diabetic chronic kidney disease: Secondary | ICD-10-CM | POA: Diagnosis not present

## 2017-12-14 ENCOUNTER — Other Ambulatory Visit: Payer: Self-pay

## 2017-12-14 NOTE — Patient Outreach (Signed)
/  [name]) Care Management/  12/14/2017  [name] [facility] 03-05-1946 [id number]       EMMI-HF RED ON EMMI ALERT Day # 8 Date: 12/12/17 Red Alert Reason: " Went to follow up appt? No"    Red on EMMI dashboard received. No outreach call warranted to patient at this time. RN CM addressed issue on previous call. Patient has PCP follow up appt on 12/16/17.      Plan: RN CM will close case at this time as no further interventions warranted.    Enzo Montgomery, RN,BSN,CCM [place] Management Telephonic Care Management Coordinator Direct Phone: [phone] Toll Free: [phone] Fax: [phone]

## 2017-12-15 DIAGNOSIS — N2581 Secondary hyperparathyroidism of renal origin: Secondary | ICD-10-CM | POA: Diagnosis not present

## 2017-12-15 DIAGNOSIS — E876 Hypokalemia: Secondary | ICD-10-CM | POA: Diagnosis not present

## 2017-12-15 DIAGNOSIS — E1129 Type 2 diabetes mellitus with other diabetic kidney complication: Secondary | ICD-10-CM | POA: Diagnosis not present

## 2017-12-15 DIAGNOSIS — D631 Anemia in chronic kidney disease: Secondary | ICD-10-CM | POA: Diagnosis not present

## 2017-12-15 DIAGNOSIS — Z23 Encounter for immunization: Secondary | ICD-10-CM | POA: Diagnosis not present

## 2017-12-15 DIAGNOSIS — N186 End stage renal disease: Secondary | ICD-10-CM | POA: Diagnosis not present

## 2017-12-16 ENCOUNTER — Encounter: Payer: Self-pay | Admitting: Internal Medicine

## 2017-12-16 ENCOUNTER — Ambulatory Visit (INDEPENDENT_AMBULATORY_CARE_PROVIDER_SITE_OTHER): Payer: Medicare Other | Admitting: Internal Medicine

## 2017-12-16 VITALS — BP 160/72 | HR 88 | Temp 97.3°F | Resp 16 | Ht 65.0 in | Wt 163.2 lb

## 2017-12-16 DIAGNOSIS — E782 Mixed hyperlipidemia: Secondary | ICD-10-CM | POA: Diagnosis not present

## 2017-12-16 DIAGNOSIS — I2510 Atherosclerotic heart disease of native coronary artery without angina pectoris: Secondary | ICD-10-CM | POA: Diagnosis not present

## 2017-12-16 DIAGNOSIS — I059 Rheumatic mitral valve disease, unspecified: Secondary | ICD-10-CM | POA: Diagnosis not present

## 2017-12-16 DIAGNOSIS — E1149 Type 2 diabetes mellitus with other diabetic neurological complication: Secondary | ICD-10-CM | POA: Diagnosis not present

## 2017-12-16 DIAGNOSIS — R001 Bradycardia, unspecified: Secondary | ICD-10-CM | POA: Diagnosis not present

## 2017-12-16 DIAGNOSIS — Z79899 Other long term (current) drug therapy: Secondary | ICD-10-CM | POA: Diagnosis not present

## 2017-12-16 DIAGNOSIS — N186 End stage renal disease: Secondary | ICD-10-CM

## 2017-12-16 DIAGNOSIS — I2583 Coronary atherosclerosis due to lipid rich plaque: Secondary | ICD-10-CM

## 2017-12-16 DIAGNOSIS — N179 Acute kidney failure, unspecified: Secondary | ICD-10-CM | POA: Diagnosis not present

## 2017-12-16 DIAGNOSIS — N185 Chronic kidney disease, stage 5: Secondary | ICD-10-CM | POA: Diagnosis not present

## 2017-12-16 DIAGNOSIS — D631 Anemia in chronic kidney disease: Secondary | ICD-10-CM | POA: Diagnosis not present

## 2017-12-16 DIAGNOSIS — R4182 Altered mental status, unspecified: Secondary | ICD-10-CM | POA: Diagnosis not present

## 2017-12-16 DIAGNOSIS — Z992 Dependence on renal dialysis: Secondary | ICD-10-CM | POA: Diagnosis not present

## 2017-12-16 DIAGNOSIS — I058 Other rheumatic mitral valve diseases: Secondary | ICD-10-CM

## 2017-12-16 DIAGNOSIS — IMO0002 Reserved for concepts with insufficient information to code with codable children: Secondary | ICD-10-CM

## 2017-12-16 DIAGNOSIS — N184 Chronic kidney disease, stage 4 (severe): Secondary | ICD-10-CM | POA: Diagnosis not present

## 2017-12-16 DIAGNOSIS — J810 Acute pulmonary edema: Secondary | ICD-10-CM | POA: Diagnosis not present

## 2017-12-16 DIAGNOSIS — R531 Weakness: Secondary | ICD-10-CM

## 2017-12-16 DIAGNOSIS — I10 Essential (primary) hypertension: Secondary | ICD-10-CM

## 2017-12-16 DIAGNOSIS — E1122 Type 2 diabetes mellitus with diabetic chronic kidney disease: Secondary | ICD-10-CM

## 2017-12-16 DIAGNOSIS — Z794 Long term (current) use of insulin: Secondary | ICD-10-CM | POA: Diagnosis not present

## 2017-12-16 DIAGNOSIS — E1165 Type 2 diabetes mellitus with hyperglycemia: Secondary | ICD-10-CM

## 2017-12-16 NOTE — Progress Notes (Signed)
[name]     This very nice 72 y.o. MWF was admitted to the hospital on  11/26/2017  and patient was discharged from the hospital on 12/04/2017. The patient now presents for follow up for transition from recent hospitalization.  The day after discharge  our clinical staff contacted the patient to assure stability and schedule a follow up appointment. The discharge summary, medications and diagnostic test results were reviewed before meeting with the patient. 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 CHF improved with hemodialysis. Her mental status improved with her dialysis treatments.        Hospitalization discharge instructions and medications are reconciled with the patient.      Patient has been  followed with Hypertension, Hyperlipidemia, Ins Req T2_DM and Vitamin D Deficiency.      Patient is treated for HTN (1980's)  & BP has been controlled at home. Today's BP is not at goal - 160/72.   In 2014, she underwent CABG and was also hospitalized in Jan 2019 with CHF. Patient has had no complaints of any cardiac type chest pain, palpitations, dyspnea/orthopnea/PND,  dizziness, claudication, or dependent edema.     Hyperlipidemia is controlled with diet & meds. Patient denies myalgias or other med SE's. Last Lipids were at goal albeit elevated Trig's: Lab Results  Component Value Date   CHOL 147 08/04/2017   HDL 29 (L) 08/04/2017   LDLCALC 89 08/04/2017   TRIG 196 (H) 08/04/2017   CHOLHDL 5.1 (H) 08/04/2017      Also, the patient has history of T2_NIDDM predates from 2000 with progressive renal insufficiency and she had Rt brachiocephalic AVF created earlier this year. She  has had no symptoms of reactive hypoglycemia, diabetic polys or visual blurring. She does have Diabetic peripheral Neuropathy.  Last A1c was not at goal: Lab Results  Component Value Date   HGBA1C 6.1 (H) 11/26/2017      Further, the patient also has history of Vitamin D Deficiency ("25"/2008, "32"/2016 and "34"/2018) and she supplements vitamin D sporadically. Last vitamin D was still low & not at goal:   Lab Results  Component Value Date   VD25OH 48 08/04/2017   Current Outpatient Medications on File Prior to Visit  Medication Sig  . acetaminophen (TYLENOL) 500 MG tablet Take 1,000 mg by mouth every 6 (six) hours as needed for mild pain or headache.   . allopurinol (ZYLOPRIM) 100 MG tablet Take 100 mg by mouth 2 (two) times daily.  Marland Kitchen anastrozole (ARIMIDEX) 1 MG tablet Take 1 tablet (1 mg total) by mouth daily.  Marland Kitchen aspirin EC 81 MG tablet Take 1 tablet (81 mg total) by mouth daily. (Patient taking differently: Take 81 mg by mouth every evening. )  . atorvastatin (LIPITOR) 80  MG tablet Take 80 mg by mouth at bedtime.   . calcium acetate (PHOSLO) 667 MG capsule Take 1 capsule (667 mg total) by mouth 3 (three) times daily with meals.  . Cholecalciferol (VITAMIN D3) 2000 units TABS Take 4,000 Units by mouth daily.   Marland Kitchen docusate sodium (COLACE) 100 MG capsule Take 100 mg by mouth at bedtime.  . fluticasone (FLONASE) 50 MCG/ACT nasal spray Place 2 sprays into both nostrils daily as  needed for allergies or rhinitis.  . hydrALAZINE (APRESOLINE) 25 MG tablet TAKE 3 TABLETS THREE TIMES DAILY (Patient taking differently: Take 75 mg by mouth 3 (three) times daily. )  . insulin NPH-regular Human (NOVOLIN 70/30) (70-30) 100 UNIT/ML injection Inject 10-50 Units into the skin See admin instructions. Inject 50 units into the skin before breakfast and 10-20 units at bedtime  . isosorbide dinitrate (ISORDIL) 10 MG tablet TAKE 1 TABLET THREE TIMES DAILY  . Multiple Vitamins-Minerals (OCUVITE EYE HEALTH FORMULA PO) Take 1 tablet by mouth at bedtime.   . NON FORMULARY [employer] Health Tonic and Homeopathic Remedy: Mix 1 ounce with water and drink one to two times a day (5-6 times a week)  . Polyethyl Glycol-Propyl Glycol (LUBRICANT EYE DROPS) 0.4-0.3 % SOLN Place 1-2 drops into both eyes 3 (three) times daily as needed (for dry eyes.).  Marland Kitchen sodium chloride (OCEAN) 0.65 % SOLN nasal spray Place 1 spray into both nostrils as needed for congestion.  . insulin aspart (NOVOLOG) 100 UNIT/ML FlexPen Inject 3 Units into the skin daily. With your largest meal of the day (Patient not taking: Reported on 12/16/2017)   No current facility-administered medications on file prior to visit.    Allergies  Allergen Reactions  . Atenolol Other (See Comments)    Exacerbates gout  . Adhesive [Tape] Other (See Comments)    SKIN IS VERY SENSITIVE AND BRUISES AND TEARS EASILY; PLEASE USE AN ALTERNATIVE TO TAPE!!  . Contrast Media [Iodinated Diagnostic Agents] Rash  . Latex Rash  . Omnipaque [Iohexol] Rash   PMHx:   Past Medical History:  Diagnosis Date  . Allergy   . Arthritis    "maybe in my lower back" (08/27/2012)  . 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 Chronic combined systolic and diastolic CHF (congestive heart failure) ([place])   . CKD (chronic kidney disease), stage IV ([place])   . Coronary artery disease    a. NSTEMI in 05/2009 s/p CABG (LIMA-LAD,  SVG-diagonal, SVG-OM1/OM 2).   Marland Kitchen Dyspnea    with exertion  . Family history of anesthesia complication    Mom has a hard time to wake up  . Foot ulcer ([place])    "I've had them on both feet" (08/27/2012)  . Gouty arthritis    "right index finger" (08/27/2012)  . History of radiation therapy 06/09/13-07/06/13   left breast 50Gy  . Hyperlipidemia   . Hypertension   . Infection    right second toe  . Ischemic cardiomyopathy    a. 2011: EF 40-45%. b. EF 55-60% in 04/2017 but shortly after as inpatient was 45-50%.  Marland Kitchen LBBB (left bundle branch block)   . Mitral regurgitation   . Neuropathy    Hx; of B/L feet  . NSTEMI (non-ST elevated myocardial infarction) ([place]) 05/23/2009  . Osteomyelitis of foot ([place])   . PAD (peripheral artery disease) (HCC)    a. LE PAD (patient previously elected hold off L fem-pop), prev followed by Dr. Bridgett Larsson  .  PONV (postoperative nausea and vomiting)   . Sinus headache    "occasionally" (08/27/2012)  . Type II diabetes mellitus ([place])   . Vitamin D deficiency    Immunization History  Administered Date(s) Administered  . Influenza, High Dose Seasonal PF 01/30/2014, 02/14/2015, 12/17/2015, 01/22/2017  . Influenza-Unspecified 02/05/2013  . Pneumococcal Conjugate-13 01/30/2014  . Pneumococcal Polysaccharide-23 01/07/2011  . Td 01/13/2012  . Zoster 01/13/2012   Past Surgical History:  Procedure Laterality Date  . AMPUTATION Right 08/27/2012   Procedure: AMPUTATION RAY;  Surgeon: Newt Minion, MD;  Location: [facility];  Service: Orthopedics;  Laterality: Right;  Right Foot 5th Ray Amputation  . AMPUTATION Left 07/08/2013   Procedure: AMPUTATION RAY;  Surgeon: Newt Minion, MD;  Location: [place];  Service: Orthopedics;  Laterality: Left;  Left Foot 2nd Ray Amputation  . AMPUTATION RAY Right 08/27/2012   5th ray/notes 08/27/2012  . AMPUTATION TOE Right 03/06/2017   Procedure: AMPUTATION TOE, INTERPHANGEAL 2ND RIGHT;  Surgeon: [name], DPM;  Location: [place];   Service: Podiatry;  Laterality: Right;  . AV FISTULA PLACEMENT Right 11/11/2017   Procedure: RIGHT RADIOCEPHALIC  ARTERIOVENOUS FISTULA;  Surgeon: [name], MD;  Location: [place];  Service: Vascular;  Laterality: Right;  . BREAST LUMPECTOMY  04/20/2013   with biopsy      DR WAKEFIELD  . BREAST LUMPECTOMY WITH NEEDLE LOCALIZATION AND AXILLARY SENTINEL LYMPH NODE BX Left 04/20/2013   Procedure: LEFT BREAST WIRE GUIDED LUMPECTOMY AND AXILLARY SENTINEL LYMPH NODE BX;  Surgeon: Rolm Bookbinder, MD;  Location: [facility];  Service: General;  Laterality: Left;  . CARDIAC CATHETERIZATION  05/24/2009   Archie Endo 05/24/2009 (08/27/2012)  . CATARACT EXTRACTION W/ INTRAOCULAR LENS  IMPLANT, BILATERAL  2000's  . COLONOSCOPY W/ BIOPSIES AND POLYPECTOMY  2010  . CORONARY ARTERY BYPASS GRAFT  2011   "CABG X4" (08/27/2012)  . DENTAL SURGERY  04/30/11   "1 implant" (08/27/2012)  . DILATION AND CURETTAGE OF UTERUS    . EYE SURGERY    . FINGER SURGERY Right    "index finger; turned out to be gout" (08/27/2012)  . IR FLUORO GUIDE CV LINE RIGHT  11/30/2017  . TEE WITHOUT CARDIOVERSION N/A 12/01/2017   Procedure: TRANSESOPHAGEAL ECHOCARDIOGRAM (TEE);  Surgeon: Lelon Perla, MD;  Location: [facility] ENDOSCOPY;  Service: Cardiovascular;  Laterality: N/A;   FHx:    Reviewed / unchanged  SHx:    Reviewed / unchanged  Systems Review:  Constitutional: Denies fever, chills, wt changes, headaches, insomnia, fatigue, night sweats, change in appetite. Eyes: Denies redness, blurred vision, diplopia, discharge, itchy, watery eyes.  ENT: Denies discharge, congestion, post nasal drip, epistaxis, sore throat, earache, hearing loss, dental pain, tinnitus, vertigo, sinus pain, snoring.  CV: Denies chest pain, palpitations, irregular heartbeat, syncope, dyspnea, diaphoresis, orthopnea, PND, claudication or edema. Respiratory: denies cough, dyspnea, DOE, pleurisy, hoarseness, laryngitis, wheezing.  Gastrointestinal: Denies dysphagia,  odynophagia, heartburn, reflux, water brash, abdominal pain or cramps, nausea, vomiting, bloating, diarrhea, constipation, hematemesis, melena, hematochezia  or hemorrhoids. Genitourinary: Denies dysuria, frequency, urgency, nocturia, hesitancy, discharge, hematuria or flank pain. Musculoskeletal: Denies arthralgias, myalgias, stiffness, jt. swelling, pain, limping or strain/sprain.  Skin: Denies pruritus, rash, hives, warts, acne, eczema or change in skin lesion(s). Neuro: No weakness, tremor, incoordination, spasms, paresthesia or pain. Psychiatric: Denies confusion, memory loss or sensory loss. Endo: Denies change in weight, skin or hair change.  Heme/Lymph: No excessive bleeding, bruising or enlarged lymph nodes.  Physical Exam  BP (!) 160/72  Pulse 88   Temp (!) 97.3 F (36.3 C)   Resp 16   Ht 5\' 5"  (1.651 m)   Wt 163 lb 3.2 oz (74 kg)   BMI 27.16 kg/m   Appears well nourished, well groomed  and in no distress.  Eyes: PERRLA, EOMs, conjunctiva no swelling or erythema. Sinuses: No frontal/maxillary tenderness ENT/Mouth: EAC's clear, TM's nl w/o erythema, bulging. Nares clear w/o erythema, swelling, exudates. Oropharynx clear without erythema or exudates. Oral hygiene is good. Tongue normal, non obstructing. Hearing intact.  Neck: Supple. Thyroid nl. Car 2+/2+ without bruits, nodes or JVD. Chest: Respirations nl with BS clear & equal w/o rales, rhonchi, wheezing or stridor.  Cor: Heart sounds normal w/ regular rate and rhythm without sig. murmurs, gallops, clicks or rubs. Peripheral pulses normal and equal  without edema.  Abdomen: Soft & bowel sounds normal. Non-tender w/o guarding, rebound, hernias, masses or organomegaly.  Lymphatics: Unremarkable.  Musculoskeletal: Full ROM all peripheral extremities, joint stability, 5/5 strength and normal gait.  Skin: Warm, dry without exposed rashes, lesions or ecchymosis apparent.  Neuro: Cranial nerves intact, reflexes equal  bilaterally. Sensory-motor testing grossly intact. Tendon reflexes grossly intact.  Pysch: Alert & oriented x 3.  Insight and judgement nl & appropriate. No ideations.  Assessment and Plan:  - Continue medication, monitor blood pressure at home.  - Continue DASH diet. Reminder to go to the ER if any CP,  SOB, nausea, dizziness, severe HA, changes vision/speech.   - Continue diet/meds, exercise,& lifestyle modifications.  - Continue monitor periodic cholesterol/liver & renal functions   1. Acute renal failure superimposed on stage 4 chronic kidney disease, unspecified acute renal failure type (HCC)  - COMPLETE METABOLIC PANEL WITH GFR  2. ESRD (end stage renal disease) (HCC)  - COMPLETE METABOLIC PANEL WITH GFR  3. Type II diabetes mellitus with neurological manifestations(HCC)   4. Type 2 diabetes mellitus with chronic kidney disease on chronic dialysis, with long-term current use of insulin (HCC)  - COMPLETE METABOLIC PANEL WITH GFR  5. Altered mental status, unspecified altered mental status type   6. Anemia in chronic kidney disease, on chronic dialysis (HCC)  - CBC with Differential/Platelet  7. Coronary artery disease due to lipid rich plaque   8. Bradycardia   9. Mitral valve mass   10. Acute pulmonary edema (HCC)  - DG Chest 2 View; Future  11. General weakness  - CBC with Differential/Platelet  12. Essential hypertension  - CBC with Differential/Platelet - COMPLETE METABOLIC PANEL WITH GFR  13. Hyperlipidemia, mixed   14. Medication management  - CBC with Differential/Platelet - COMPLETE METABOLIC PANEL WITH GFR - Continue diet, exercise, lifestyle modifications.  - Monitor appropriate labs. - Continue supplementation.        Discussed  regular exercise, BP monitoring, weight control to achieve/maintain BMI less than 25 and discussed meds and SE's. Recommended labs to assess and monitor clinical status with further disposition pending  results of labs. Over 30 minutes of exam, counseling, chart review was performed.

## 2017-12-16 NOTE — Patient Instructions (Signed)

## 2017-12-17 ENCOUNTER — Encounter: Payer: Self-pay | Admitting: *Deleted

## 2017-12-17 DIAGNOSIS — E1129 Type 2 diabetes mellitus with other diabetic kidney complication: Secondary | ICD-10-CM | POA: Diagnosis not present

## 2017-12-17 DIAGNOSIS — D631 Anemia in chronic kidney disease: Secondary | ICD-10-CM | POA: Diagnosis not present

## 2017-12-17 DIAGNOSIS — N2581 Secondary hyperparathyroidism of renal origin: Secondary | ICD-10-CM | POA: Diagnosis not present

## 2017-12-17 DIAGNOSIS — E876 Hypokalemia: Secondary | ICD-10-CM | POA: Diagnosis not present

## 2017-12-17 DIAGNOSIS — Z23 Encounter for immunization: Secondary | ICD-10-CM | POA: Diagnosis not present

## 2017-12-17 DIAGNOSIS — N186 End stage renal disease: Secondary | ICD-10-CM | POA: Diagnosis not present

## 2017-12-17 LAB — COMPLETE METABOLIC PANEL WITH GFR
AG Ratio: 1.8 (calc) (ref 1.0–2.5)
ALT: 11 U/L (ref 6–29)
AST: 23 U/L (ref 10–35)
Albumin: 3.9 g/dL (ref 3.6–5.1)
Alkaline phosphatase (APISO): 110 U/L (ref 33–130)
BUN/Creatinine Ratio: 8 (calc) (ref 6–22)
BUN: 20 mg/dL (ref 7–25)
CALCIUM: 10.3 mg/dL (ref 8.6–10.4)
CO2: 36 mmol/L — AB (ref 20–32)
Chloride: 99 mmol/L (ref 98–110)
Creat: 2.43 mg/dL — ABNORMAL HIGH (ref 0.60–0.93)
GFR, Est African American: 22 mL/min/{1.73_m2} — ABNORMAL LOW (ref >=60)
GFR, Est Non African American: 19 mL/min/{1.73_m2} — ABNORMAL LOW (ref >=60)
GLUCOSE: 27 mg/dL — AB (ref 65–99)
Globulin: 2.2 g/dL (calc) (ref 1.9–3.7)
Potassium: 4.1 mmol/L (ref 3.5–5.3)
Sodium: 141 mmol/L (ref 135–146)
TOTAL PROTEIN: 6.1 g/dL (ref 6.1–8.1)
Total Bilirubin: 0.6 mg/dL (ref 0.2–1.2)

## 2017-12-17 LAB — CBC WITH DIFFERENTIAL/PLATELET
BASOS PCT: 0.3 %
Basophils Absolute: 24 cells/uL (ref 0–200)
Eosinophils Absolute: 112 cells/uL (ref 15–500)
Eosinophils Relative: 1.4 %
HCT: 39.1 % (ref 35.0–45.0)
HEMOGLOBIN: 12.4 g/dL (ref 11.7–15.5)
Lymphs Abs: 2248 cells/uL (ref 850–3900)
MCH: 27.7 pg (ref 27.0–33.0)
MCHC: 31.7 g/dL — ABNORMAL LOW (ref 32.0–36.0)
MCV: 87.3 fL (ref 80.0–100.0)
MONOS PCT: 11.8 %
MPV: 10.3 fL (ref 7.5–12.5)
NEUTROS ABS: 4672 {cells}/uL (ref 1500–7800)
Neutrophils Relative %: 58.4 %
Platelets: 255 10*3/uL (ref 140–400)
RBC: 4.48 10*6/uL (ref 3.80–5.10)
RDW: 18 % — ABNORMAL HIGH (ref 11.0–15.0)
TOTAL LYMPHOCYTE: 28.1 %
WBC mixed population: 944 cells/uL (ref 200–950)
WBC: 8 10*3/uL (ref 3.8–10.8)

## 2017-12-19 DIAGNOSIS — E1129 Type 2 diabetes mellitus with other diabetic kidney complication: Secondary | ICD-10-CM | POA: Diagnosis not present

## 2017-12-19 DIAGNOSIS — D631 Anemia in chronic kidney disease: Secondary | ICD-10-CM | POA: Diagnosis not present

## 2017-12-19 DIAGNOSIS — N2581 Secondary hyperparathyroidism of renal origin: Secondary | ICD-10-CM | POA: Diagnosis not present

## 2017-12-19 DIAGNOSIS — N186 End stage renal disease: Secondary | ICD-10-CM | POA: Diagnosis not present

## 2017-12-19 DIAGNOSIS — Z23 Encounter for immunization: Secondary | ICD-10-CM | POA: Diagnosis not present

## 2017-12-19 DIAGNOSIS — E876 Hypokalemia: Secondary | ICD-10-CM | POA: Diagnosis not present

## 2017-12-22 DIAGNOSIS — N2581 Secondary hyperparathyroidism of renal origin: Secondary | ICD-10-CM | POA: Diagnosis not present

## 2017-12-22 DIAGNOSIS — N186 End stage renal disease: Secondary | ICD-10-CM | POA: Diagnosis not present

## 2017-12-22 DIAGNOSIS — Z23 Encounter for immunization: Secondary | ICD-10-CM | POA: Diagnosis not present

## 2017-12-22 DIAGNOSIS — E876 Hypokalemia: Secondary | ICD-10-CM | POA: Diagnosis not present

## 2017-12-22 DIAGNOSIS — D631 Anemia in chronic kidney disease: Secondary | ICD-10-CM | POA: Diagnosis not present

## 2017-12-22 DIAGNOSIS — E1129 Type 2 diabetes mellitus with other diabetic kidney complication: Secondary | ICD-10-CM | POA: Diagnosis not present

## 2017-12-23 DIAGNOSIS — E1122 Type 2 diabetes mellitus with diabetic chronic kidney disease: Secondary | ICD-10-CM | POA: Diagnosis not present

## 2017-12-23 DIAGNOSIS — E1151 Type 2 diabetes mellitus with diabetic peripheral angiopathy without gangrene: Secondary | ICD-10-CM | POA: Diagnosis not present

## 2017-12-23 DIAGNOSIS — I5042 Chronic combined systolic (congestive) and diastolic (congestive) heart failure: Secondary | ICD-10-CM | POA: Diagnosis not present

## 2017-12-23 DIAGNOSIS — I132 Hypertensive heart and chronic kidney disease with heart failure and with stage 5 chronic kidney disease, or end stage renal disease: Secondary | ICD-10-CM | POA: Diagnosis not present

## 2017-12-23 DIAGNOSIS — N186 End stage renal disease: Secondary | ICD-10-CM | POA: Diagnosis not present

## 2017-12-23 DIAGNOSIS — D631 Anemia in chronic kidney disease: Secondary | ICD-10-CM | POA: Diagnosis not present

## 2017-12-24 ENCOUNTER — Encounter (HOSPITAL_COMMUNITY): Payer: Medicare Other

## 2017-12-24 DIAGNOSIS — N186 End stage renal disease: Secondary | ICD-10-CM | POA: Diagnosis not present

## 2017-12-24 DIAGNOSIS — E1129 Type 2 diabetes mellitus with other diabetic kidney complication: Secondary | ICD-10-CM | POA: Diagnosis not present

## 2017-12-24 DIAGNOSIS — D631 Anemia in chronic kidney disease: Secondary | ICD-10-CM | POA: Diagnosis not present

## 2017-12-24 DIAGNOSIS — N2581 Secondary hyperparathyroidism of renal origin: Secondary | ICD-10-CM | POA: Diagnosis not present

## 2017-12-24 DIAGNOSIS — E876 Hypokalemia: Secondary | ICD-10-CM | POA: Diagnosis not present

## 2017-12-24 DIAGNOSIS — Z23 Encounter for immunization: Secondary | ICD-10-CM | POA: Diagnosis not present

## 2017-12-25 DIAGNOSIS — D631 Anemia in chronic kidney disease: Secondary | ICD-10-CM | POA: Diagnosis not present

## 2017-12-25 DIAGNOSIS — I132 Hypertensive heart and chronic kidney disease with heart failure and with stage 5 chronic kidney disease, or end stage renal disease: Secondary | ICD-10-CM | POA: Diagnosis not present

## 2017-12-25 DIAGNOSIS — N186 End stage renal disease: Secondary | ICD-10-CM | POA: Diagnosis not present

## 2017-12-25 DIAGNOSIS — E1151 Type 2 diabetes mellitus with diabetic peripheral angiopathy without gangrene: Secondary | ICD-10-CM | POA: Diagnosis not present

## 2017-12-25 DIAGNOSIS — I5042 Chronic combined systolic (congestive) and diastolic (congestive) heart failure: Secondary | ICD-10-CM | POA: Diagnosis not present

## 2017-12-25 DIAGNOSIS — E1122 Type 2 diabetes mellitus with diabetic chronic kidney disease: Secondary | ICD-10-CM | POA: Diagnosis not present

## 2017-12-26 DIAGNOSIS — E1129 Type 2 diabetes mellitus with other diabetic kidney complication: Secondary | ICD-10-CM | POA: Diagnosis not present

## 2017-12-26 DIAGNOSIS — N2581 Secondary hyperparathyroidism of renal origin: Secondary | ICD-10-CM | POA: Diagnosis not present

## 2017-12-26 DIAGNOSIS — E876 Hypokalemia: Secondary | ICD-10-CM | POA: Diagnosis not present

## 2017-12-26 DIAGNOSIS — N186 End stage renal disease: Secondary | ICD-10-CM | POA: Diagnosis not present

## 2017-12-26 DIAGNOSIS — Z23 Encounter for immunization: Secondary | ICD-10-CM | POA: Diagnosis not present

## 2017-12-26 DIAGNOSIS — D631 Anemia in chronic kidney disease: Secondary | ICD-10-CM | POA: Diagnosis not present

## 2017-12-28 ENCOUNTER — Other Ambulatory Visit: Payer: Self-pay

## 2017-12-28 NOTE — Patient Outreach (Signed)
[facility]) Care Management  12/28/2017  [name] [facility] 12/02/1945 [id number]     EMMI-HF RED ON EMMI ALERT Day # 21 Date: 12/25/17 Red Alert Reason: " Weighed themselves today? No"   Outreach attempt # 1 to patient. Spoke with patient who voices she is doing well. Reviewed and addressed red alert with patient. She states that she missed weighing one day as she was busy and didn't get a chance to weigh that day. Patient voices that weight is staying consistent. She is adhering to diet and fluid restrictions. Patient voices that she is tolerating dialysis. She remains optimistic that she may be able to resume normal kidney function and stop dialysis.She denies any RN CM needs or concerns at this time. Patient aware that she will continue to get automated EMMI-HF calls to assess her progression. She was appreciative of follow up call.       Plan: RN CM will close case at this time.    Enzo Montgomery, RN,BSN,CCM [place] Management Telephonic Care Management Coordinator Direct Phone: [phone] Toll Free: [phone] Fax: [phone]

## 2017-12-29 DIAGNOSIS — Z23 Encounter for immunization: Secondary | ICD-10-CM | POA: Diagnosis not present

## 2017-12-29 DIAGNOSIS — N2581 Secondary hyperparathyroidism of renal origin: Secondary | ICD-10-CM | POA: Diagnosis not present

## 2017-12-29 DIAGNOSIS — D631 Anemia in chronic kidney disease: Secondary | ICD-10-CM | POA: Diagnosis not present

## 2017-12-29 DIAGNOSIS — E876 Hypokalemia: Secondary | ICD-10-CM | POA: Diagnosis not present

## 2017-12-29 DIAGNOSIS — E1129 Type 2 diabetes mellitus with other diabetic kidney complication: Secondary | ICD-10-CM | POA: Diagnosis not present

## 2017-12-29 DIAGNOSIS — N186 End stage renal disease: Secondary | ICD-10-CM | POA: Diagnosis not present

## 2017-12-30 ENCOUNTER — Other Ambulatory Visit: Payer: Self-pay

## 2017-12-30 ENCOUNTER — Ambulatory Visit (INDEPENDENT_AMBULATORY_CARE_PROVIDER_SITE_OTHER): Payer: Medicare Other | Admitting: Physician Assistant

## 2017-12-30 ENCOUNTER — Other Ambulatory Visit: Payer: Self-pay | Admitting: Adult Health

## 2017-12-30 ENCOUNTER — Ambulatory Visit (HOSPITAL_COMMUNITY)
Admission: RE | Admit: 2017-12-30 | Discharge: 2017-12-30 | Disposition: A | Discharge status: Home / Self Care | Payer: Medicare Other | Source: Ambulatory Visit | Attending: Vascular Surgery | Admitting: Vascular Surgery

## 2017-12-30 VITALS — BP 167/70 | HR 91 | Temp 98.4°F | Resp 16 | Ht 65.0 in | Wt 160.0 lb

## 2017-12-30 DIAGNOSIS — N185 Chronic kidney disease, stage 5: Secondary | ICD-10-CM | POA: Diagnosis not present

## 2017-12-30 DIAGNOSIS — N186 End stage renal disease: Secondary | ICD-10-CM

## 2017-12-30 DIAGNOSIS — Z992 Dependence on renal dialysis: Secondary | ICD-10-CM

## 2017-12-30 NOTE — Progress Notes (Signed)
POST OPERATIVE OFFICE NOTE    CC:  F/u for surgery  HPI:  This is a 72 y.o. female who is s/p radial cephalic av fistula creation.  End-stage renal disease after suffering acute kidney injury on chronic kidney disease stage V: Initiated on hemodialysis via tunneled hemodialysis catheter and with a maturing right radiocephalic AVF placed on 1/0/2725.  Process initiated for outpatient dialysis unit placement and currently on TTS schedule.  She is here today for examination and check on the maturity of her fistula.  She denise pain, numbness and loss of motor in the right UE.    She has lost 20+ lbs of fluid weight since starting on HD via [place] placed by IR on 11/30/2017.  She feels better with more energy.      Allergies  Allergen Reactions  . Atenolol Other (See Comments)    Exacerbates gout Exacerbates gout  . Adhesive [Tape] Other (See Comments)    SKIN IS VERY SENSITIVE AND BRUISES AND TEARS EASILY; PLEASE USE AN ALTERNATIVE TO TAPE!!  . Contrast Media [Iodinated Diagnostic Agents] Rash  . Latex Rash  . Omnipaque [Iohexol] Rash    Current Outpatient Medications  Medication Sig Dispense Refill  . acetaminophen (TYLENOL) 500 MG tablet Take 1,000 mg by mouth every 6 (six) hours as needed for mild pain or headache.     . allopurinol (ZYLOPRIM) 100 MG tablet Take 100 mg by mouth 2 (two) times daily.    Marland Kitchen anastrozole (ARIMIDEX) 1 MG tablet Take 1 tablet (1 mg total) by mouth daily. 90 tablet 3  . aspirin EC 81 MG tablet Take 1 tablet (81 mg total) by mouth daily. (Patient taking differently: Take 81 mg by mouth every evening. )    . atorvastatin (LIPITOR) 80 MG tablet Take 80 mg by mouth at bedtime.     . calcium acetate (PHOSLO) 667 MG capsule Take 1 capsule (667 mg total) by mouth 3 (three) times daily with meals. 90 capsule 0  . Cholecalciferol (VITAMIN D3) 2000 units TABS Take 4,000 Units by mouth daily.     Marland Kitchen docusate sodium (COLACE) 100 MG capsule Take 100 mg by mouth at bedtime.      . fluticasone (FLONASE) 50 MCG/ACT nasal spray Place 2 sprays into both nostrils daily as needed for allergies or rhinitis.    . hydrALAZINE (APRESOLINE) 25 MG tablet TAKE 3 TABLETS THREE TIMES DAILY (Patient taking differently: Take 75 mg by mouth 3 (three) times daily. ) 810 tablet 1  . insulin NPH-regular Human (NOVOLIN 70/30) (70-30) 100 UNIT/ML injection Inject 10-50 Units into the skin See admin instructions. Inject 50 units into the skin before breakfast and 10-20 units at bedtime    . isosorbide dinitrate (ISORDIL) 10 MG tablet TAKE 1 TABLET THREE TIMES DAILY 270 tablet 0  . Multiple Vitamins-Minerals (OCUVITE EYE HEALTH FORMULA PO) Take 1 tablet by mouth at bedtime.     . NON FORMULARY [employer] Health Tonic and Homeopathic Remedy: Mix 1 ounce with water and drink one to two times a day (5-6 times a week)    . Polyethyl Glycol-Propyl Glycol (LUBRICANT EYE DROPS) 0.4-0.3 % SOLN Place 1-2 drops into both eyes 3 (three) times daily as needed (for dry eyes.).    Marland Kitchen sodium chloride (OCEAN) 0.65 % SOLN nasal spray Place 1 spray into both nostrils as needed for congestion.    . insulin aspart (NOVOLOG) 100 UNIT/ML FlexPen Inject 3 Units into the skin daily. With your largest meal of the  day (Patient not taking: Reported on 12/30/2017) 9 mL 1   No current facility-administered medications for this visit.      ROS:  See HPI  Physical Exam:  Vitals:   12/30/17 1449 12/30/17 1454  BP: (!) 169/73 (!) 167/70  Pulse: 91 91  Resp: 16   Temp: 98.4 F (36.9 C)   SpO2: 100%     Incision:  Well healed radial incision on the right UE Extremities:  Visible radial cephalic fistula with palpable thrill, palpable radial and ulnar pulses.  Grip 5/5, sensation intact and equal B UE.  Lungs non labored breathing  Heart RRR  Fistula duplex 12/30/2017 0.56-0.61 in diameter Depth distal/mid fore arm <0.17, proximal fistula 0.72 depth.  Assessment/Plan:  This is a 72 y.o. female who is s/p: Right RC  AV fistula created on 11/11/2017.    The fistula is very superficial in the distal 2/3 end portion with excellent thrill.  I advised her to exercise and use her right hand for daily activities to help the proximal fistula mature.  I feel the she has enough fistula to use for dialysis in another 6 weeks.  The fistula may be used by 02/11/2018.  If there are problems or concerns call our office.     Roxy Horseman , PA-C [facility] [phone]

## 2017-12-31 DIAGNOSIS — N2581 Secondary hyperparathyroidism of renal origin: Secondary | ICD-10-CM | POA: Diagnosis not present

## 2017-12-31 DIAGNOSIS — E876 Hypokalemia: Secondary | ICD-10-CM | POA: Diagnosis not present

## 2017-12-31 DIAGNOSIS — E1129 Type 2 diabetes mellitus with other diabetic kidney complication: Secondary | ICD-10-CM | POA: Diagnosis not present

## 2017-12-31 DIAGNOSIS — D631 Anemia in chronic kidney disease: Secondary | ICD-10-CM | POA: Diagnosis not present

## 2017-12-31 DIAGNOSIS — Z23 Encounter for immunization: Secondary | ICD-10-CM | POA: Diagnosis not present

## 2017-12-31 DIAGNOSIS — N186 End stage renal disease: Secondary | ICD-10-CM | POA: Diagnosis not present

## 2018-01-01 DIAGNOSIS — E1151 Type 2 diabetes mellitus with diabetic peripheral angiopathy without gangrene: Secondary | ICD-10-CM | POA: Diagnosis not present

## 2018-01-01 DIAGNOSIS — D631 Anemia in chronic kidney disease: Secondary | ICD-10-CM | POA: Diagnosis not present

## 2018-01-01 DIAGNOSIS — I5042 Chronic combined systolic (congestive) and diastolic (congestive) heart failure: Secondary | ICD-10-CM | POA: Diagnosis not present

## 2018-01-01 DIAGNOSIS — I132 Hypertensive heart and chronic kidney disease with heart failure and with stage 5 chronic kidney disease, or end stage renal disease: Secondary | ICD-10-CM | POA: Diagnosis not present

## 2018-01-01 DIAGNOSIS — E1122 Type 2 diabetes mellitus with diabetic chronic kidney disease: Secondary | ICD-10-CM | POA: Diagnosis not present

## 2018-01-01 DIAGNOSIS — N186 End stage renal disease: Secondary | ICD-10-CM | POA: Diagnosis not present

## 2018-01-02 DIAGNOSIS — E1129 Type 2 diabetes mellitus with other diabetic kidney complication: Secondary | ICD-10-CM | POA: Diagnosis not present

## 2018-01-02 DIAGNOSIS — Z23 Encounter for immunization: Secondary | ICD-10-CM | POA: Diagnosis not present

## 2018-01-02 DIAGNOSIS — D631 Anemia in chronic kidney disease: Secondary | ICD-10-CM | POA: Diagnosis not present

## 2018-01-02 DIAGNOSIS — E876 Hypokalemia: Secondary | ICD-10-CM | POA: Diagnosis not present

## 2018-01-02 DIAGNOSIS — N186 End stage renal disease: Secondary | ICD-10-CM | POA: Diagnosis not present

## 2018-01-02 DIAGNOSIS — N2581 Secondary hyperparathyroidism of renal origin: Secondary | ICD-10-CM | POA: Diagnosis not present

## 2018-01-05 ENCOUNTER — Other Ambulatory Visit: Payer: Self-pay

## 2018-01-05 DIAGNOSIS — I132 Hypertensive heart and chronic kidney disease with heart failure and with stage 5 chronic kidney disease, or end stage renal disease: Secondary | ICD-10-CM | POA: Diagnosis not present

## 2018-01-05 DIAGNOSIS — Z23 Encounter for immunization: Secondary | ICD-10-CM | POA: Diagnosis not present

## 2018-01-05 DIAGNOSIS — Z992 Dependence on renal dialysis: Secondary | ICD-10-CM | POA: Diagnosis not present

## 2018-01-05 DIAGNOSIS — I5042 Chronic combined systolic (congestive) and diastolic (congestive) heart failure: Secondary | ICD-10-CM | POA: Diagnosis not present

## 2018-01-05 DIAGNOSIS — N186 End stage renal disease: Secondary | ICD-10-CM | POA: Diagnosis not present

## 2018-01-05 DIAGNOSIS — E1151 Type 2 diabetes mellitus with diabetic peripheral angiopathy without gangrene: Secondary | ICD-10-CM | POA: Diagnosis not present

## 2018-01-05 DIAGNOSIS — D631 Anemia in chronic kidney disease: Secondary | ICD-10-CM | POA: Diagnosis not present

## 2018-01-05 DIAGNOSIS — E1122 Type 2 diabetes mellitus with diabetic chronic kidney disease: Secondary | ICD-10-CM | POA: Diagnosis not present

## 2018-01-05 DIAGNOSIS — E876 Hypokalemia: Secondary | ICD-10-CM | POA: Diagnosis not present

## 2018-01-05 DIAGNOSIS — N2581 Secondary hyperparathyroidism of renal origin: Secondary | ICD-10-CM | POA: Diagnosis not present

## 2018-01-05 NOTE — Patient Outreach (Signed)
[facility]) Care Management  01/05/2018  [name] [facility] 08-May-1945 [id number]     EMMI-HF RED ON EMMI ALERT Day # 31 Date: 01/04/18 Red Alert Reason: "New/worsening problems? Yes"   Outreach attempt #  1 to patient. Spoke with patient who voices she is doing well.Reviewed and addressed red alert with patient. Patient states that lately the automated machine is having more trouble correctly hearing her and she voices some big frustrations with thi issue. She denies an new and/or worsening problems and voices error in response. Patient states that things continue to be going well. She inquired about how many more days left for automated. Patient advised she was on Day #32 of 45 days. She stets that she would like to stop calls at this time as she is tired of receiving them. She is being examined and monitored closely at dialysis center along with her weight and does not feel like the calls are beneficial anymore. Patient appreciative of follow up call.       Plan: RN CM will close case at this time. RN CM will notify [facility] administrative assistant to deactivate EMMI-HF calls.     Enzo Montgomery, RN,BSN,CCM [place] Management Telephonic Care Management Coordinator Direct Phone: [phone] Toll Free: [phone] Fax: [phone]

## 2018-01-06 DIAGNOSIS — I5042 Chronic combined systolic (congestive) and diastolic (congestive) heart failure: Secondary | ICD-10-CM | POA: Diagnosis not present

## 2018-01-06 DIAGNOSIS — I132 Hypertensive heart and chronic kidney disease with heart failure and with stage 5 chronic kidney disease, or end stage renal disease: Secondary | ICD-10-CM | POA: Diagnosis not present

## 2018-01-06 DIAGNOSIS — E1151 Type 2 diabetes mellitus with diabetic peripheral angiopathy without gangrene: Secondary | ICD-10-CM | POA: Diagnosis not present

## 2018-01-06 DIAGNOSIS — E1122 Type 2 diabetes mellitus with diabetic chronic kidney disease: Secondary | ICD-10-CM | POA: Diagnosis not present

## 2018-01-06 DIAGNOSIS — D631 Anemia in chronic kidney disease: Secondary | ICD-10-CM | POA: Diagnosis not present

## 2018-01-06 DIAGNOSIS — N186 End stage renal disease: Secondary | ICD-10-CM | POA: Diagnosis not present

## 2018-01-07 DIAGNOSIS — N2581 Secondary hyperparathyroidism of renal origin: Secondary | ICD-10-CM | POA: Diagnosis not present

## 2018-01-07 DIAGNOSIS — D631 Anemia in chronic kidney disease: Secondary | ICD-10-CM | POA: Diagnosis not present

## 2018-01-07 DIAGNOSIS — N186 End stage renal disease: Secondary | ICD-10-CM | POA: Diagnosis not present

## 2018-01-07 DIAGNOSIS — Z23 Encounter for immunization: Secondary | ICD-10-CM | POA: Diagnosis not present

## 2018-01-07 DIAGNOSIS — E876 Hypokalemia: Secondary | ICD-10-CM | POA: Diagnosis not present

## 2018-01-08 DIAGNOSIS — N186 End stage renal disease: Secondary | ICD-10-CM | POA: Diagnosis not present

## 2018-01-08 DIAGNOSIS — I5042 Chronic combined systolic (congestive) and diastolic (congestive) heart failure: Secondary | ICD-10-CM | POA: Diagnosis not present

## 2018-01-08 DIAGNOSIS — E1122 Type 2 diabetes mellitus with diabetic chronic kidney disease: Secondary | ICD-10-CM | POA: Diagnosis not present

## 2018-01-08 DIAGNOSIS — E1151 Type 2 diabetes mellitus with diabetic peripheral angiopathy without gangrene: Secondary | ICD-10-CM | POA: Diagnosis not present

## 2018-01-08 DIAGNOSIS — I132 Hypertensive heart and chronic kidney disease with heart failure and with stage 5 chronic kidney disease, or end stage renal disease: Secondary | ICD-10-CM | POA: Diagnosis not present

## 2018-01-08 DIAGNOSIS — D631 Anemia in chronic kidney disease: Secondary | ICD-10-CM | POA: Diagnosis not present

## 2018-01-09 DIAGNOSIS — Z23 Encounter for immunization: Secondary | ICD-10-CM | POA: Diagnosis not present

## 2018-01-09 DIAGNOSIS — D631 Anemia in chronic kidney disease: Secondary | ICD-10-CM | POA: Diagnosis not present

## 2018-01-09 DIAGNOSIS — N186 End stage renal disease: Secondary | ICD-10-CM | POA: Diagnosis not present

## 2018-01-09 DIAGNOSIS — E876 Hypokalemia: Secondary | ICD-10-CM | POA: Diagnosis not present

## 2018-01-09 DIAGNOSIS — N2581 Secondary hyperparathyroidism of renal origin: Secondary | ICD-10-CM | POA: Diagnosis not present

## 2018-01-12 DIAGNOSIS — Z23 Encounter for immunization: Secondary | ICD-10-CM | POA: Diagnosis not present

## 2018-01-12 DIAGNOSIS — D631 Anemia in chronic kidney disease: Secondary | ICD-10-CM | POA: Diagnosis not present

## 2018-01-12 DIAGNOSIS — N186 End stage renal disease: Secondary | ICD-10-CM | POA: Diagnosis not present

## 2018-01-12 DIAGNOSIS — E876 Hypokalemia: Secondary | ICD-10-CM | POA: Diagnosis not present

## 2018-01-12 DIAGNOSIS — N2581 Secondary hyperparathyroidism of renal origin: Secondary | ICD-10-CM | POA: Diagnosis not present

## 2018-01-13 DIAGNOSIS — I132 Hypertensive heart and chronic kidney disease with heart failure and with stage 5 chronic kidney disease, or end stage renal disease: Secondary | ICD-10-CM | POA: Diagnosis not present

## 2018-01-13 DIAGNOSIS — E1122 Type 2 diabetes mellitus with diabetic chronic kidney disease: Secondary | ICD-10-CM | POA: Diagnosis not present

## 2018-01-13 DIAGNOSIS — D631 Anemia in chronic kidney disease: Secondary | ICD-10-CM | POA: Diagnosis not present

## 2018-01-13 DIAGNOSIS — E1151 Type 2 diabetes mellitus with diabetic peripheral angiopathy without gangrene: Secondary | ICD-10-CM | POA: Diagnosis not present

## 2018-01-13 DIAGNOSIS — N186 End stage renal disease: Secondary | ICD-10-CM | POA: Diagnosis not present

## 2018-01-13 DIAGNOSIS — I5042 Chronic combined systolic (congestive) and diastolic (congestive) heart failure: Secondary | ICD-10-CM | POA: Diagnosis not present

## 2018-01-14 DIAGNOSIS — N2581 Secondary hyperparathyroidism of renal origin: Secondary | ICD-10-CM | POA: Diagnosis not present

## 2018-01-14 DIAGNOSIS — D631 Anemia in chronic kidney disease: Secondary | ICD-10-CM | POA: Diagnosis not present

## 2018-01-14 DIAGNOSIS — E876 Hypokalemia: Secondary | ICD-10-CM | POA: Diagnosis not present

## 2018-01-14 DIAGNOSIS — Z23 Encounter for immunization: Secondary | ICD-10-CM | POA: Diagnosis not present

## 2018-01-14 DIAGNOSIS — N186 End stage renal disease: Secondary | ICD-10-CM | POA: Diagnosis not present

## 2018-01-15 ENCOUNTER — Telehealth: Payer: Self-pay | Admitting: *Deleted

## 2018-01-15 NOTE — Telephone Encounter (Signed)
Consult note from 11/2017 noted - seen by Doreene Adas, PA and Dr. Johnsie Cancel.  Noted in the consult note that the patient had follow up with me already arranged from prior visit and was advised to keep.    I am happy to see her next week - however, if she is doing well and does not wish to be seen, she can keep her follow up with Dr. Johnsie Cancel in December and I will see back as she needs.

## 2018-01-15 NOTE — Telephone Encounter (Signed)
S/w pt to move upcoming appt with Cecille Rubin on Oct 14 to Oct 18 due to an emergency came up and clinic was cancelled.  Pt stated saw Dr. Johnsie Cancel last time in hospital and Dr. Johnsie Cancel stated for pt to come back and see him.  Pt could not get appt till December with Dr. Johnsie Cancel.  Pt wants to know if pt still needs to keep upcoming Lori appt.  Stated will send to [facility] and call pt back if pt does not need to come in and see Cecille Rubin.

## 2018-01-16 DIAGNOSIS — N2581 Secondary hyperparathyroidism of renal origin: Secondary | ICD-10-CM | POA: Diagnosis not present

## 2018-01-16 DIAGNOSIS — D631 Anemia in chronic kidney disease: Secondary | ICD-10-CM | POA: Diagnosis not present

## 2018-01-16 DIAGNOSIS — Z23 Encounter for immunization: Secondary | ICD-10-CM | POA: Diagnosis not present

## 2018-01-16 DIAGNOSIS — E876 Hypokalemia: Secondary | ICD-10-CM | POA: Diagnosis not present

## 2018-01-16 DIAGNOSIS — N186 End stage renal disease: Secondary | ICD-10-CM | POA: Diagnosis not present

## 2018-01-18 ENCOUNTER — Ambulatory Visit: Payer: Medicare Other | Admitting: Nurse Practitioner

## 2018-01-18 NOTE — Telephone Encounter (Signed)
Noted  

## 2018-01-18 NOTE — Telephone Encounter (Signed)
S/w pt stated does not having any concerning issues right now, pt thinks is doing well, gets  bp checked at dialysis, bp has been ok,  has a [facility]red nurse to come in for [facility].  Pt is going to keep December appt with Dr. Johnsie Cancel and will call and schedule appt with Cecille Rubin if pt needs to be seen before December. Appt for Cecille Rubin has been cancelled. Will send to [place] to [place].

## 2018-01-19 DIAGNOSIS — Z23 Encounter for immunization: Secondary | ICD-10-CM | POA: Diagnosis not present

## 2018-01-19 DIAGNOSIS — E876 Hypokalemia: Secondary | ICD-10-CM | POA: Diagnosis not present

## 2018-01-19 DIAGNOSIS — D631 Anemia in chronic kidney disease: Secondary | ICD-10-CM | POA: Diagnosis not present

## 2018-01-19 DIAGNOSIS — N186 End stage renal disease: Secondary | ICD-10-CM | POA: Diagnosis not present

## 2018-01-19 DIAGNOSIS — N2581 Secondary hyperparathyroidism of renal origin: Secondary | ICD-10-CM | POA: Diagnosis not present

## 2018-01-20 ENCOUNTER — Other Ambulatory Visit: Payer: Self-pay | Admitting: Internal Medicine

## 2018-01-20 DIAGNOSIS — R922 Inconclusive mammogram: Secondary | ICD-10-CM | POA: Diagnosis not present

## 2018-01-20 DIAGNOSIS — D631 Anemia in chronic kidney disease: Secondary | ICD-10-CM | POA: Diagnosis not present

## 2018-01-20 DIAGNOSIS — E1122 Type 2 diabetes mellitus with diabetic chronic kidney disease: Secondary | ICD-10-CM | POA: Diagnosis not present

## 2018-01-20 DIAGNOSIS — I5042 Chronic combined systolic (congestive) and diastolic (congestive) heart failure: Secondary | ICD-10-CM | POA: Diagnosis not present

## 2018-01-20 DIAGNOSIS — I132 Hypertensive heart and chronic kidney disease with heart failure and with stage 5 chronic kidney disease, or end stage renal disease: Secondary | ICD-10-CM | POA: Diagnosis not present

## 2018-01-20 DIAGNOSIS — N186 End stage renal disease: Secondary | ICD-10-CM | POA: Diagnosis not present

## 2018-01-20 DIAGNOSIS — E1151 Type 2 diabetes mellitus with diabetic peripheral angiopathy without gangrene: Secondary | ICD-10-CM | POA: Diagnosis not present

## 2018-01-20 DIAGNOSIS — Z853 Personal history of malignant neoplasm of breast: Secondary | ICD-10-CM | POA: Diagnosis not present

## 2018-01-20 LAB — HM MAMMOGRAPHY

## 2018-01-21 DIAGNOSIS — Z23 Encounter for immunization: Secondary | ICD-10-CM | POA: Diagnosis not present

## 2018-01-21 DIAGNOSIS — E876 Hypokalemia: Secondary | ICD-10-CM | POA: Diagnosis not present

## 2018-01-21 DIAGNOSIS — D631 Anemia in chronic kidney disease: Secondary | ICD-10-CM | POA: Diagnosis not present

## 2018-01-21 DIAGNOSIS — N2581 Secondary hyperparathyroidism of renal origin: Secondary | ICD-10-CM | POA: Diagnosis not present

## 2018-01-21 DIAGNOSIS — N186 End stage renal disease: Secondary | ICD-10-CM | POA: Diagnosis not present

## 2018-01-22 ENCOUNTER — Ambulatory Visit: Payer: Medicare Other | Admitting: Nurse Practitioner

## 2018-01-23 DIAGNOSIS — N186 End stage renal disease: Secondary | ICD-10-CM | POA: Diagnosis not present

## 2018-01-23 DIAGNOSIS — N2581 Secondary hyperparathyroidism of renal origin: Secondary | ICD-10-CM | POA: Diagnosis not present

## 2018-01-23 DIAGNOSIS — Z23 Encounter for immunization: Secondary | ICD-10-CM | POA: Diagnosis not present

## 2018-01-23 DIAGNOSIS — D631 Anemia in chronic kidney disease: Secondary | ICD-10-CM | POA: Diagnosis not present

## 2018-01-23 DIAGNOSIS — E876 Hypokalemia: Secondary | ICD-10-CM | POA: Diagnosis not present

## 2018-01-26 DIAGNOSIS — E876 Hypokalemia: Secondary | ICD-10-CM | POA: Diagnosis not present

## 2018-01-26 DIAGNOSIS — N186 End stage renal disease: Secondary | ICD-10-CM | POA: Diagnosis not present

## 2018-01-26 DIAGNOSIS — N2581 Secondary hyperparathyroidism of renal origin: Secondary | ICD-10-CM | POA: Diagnosis not present

## 2018-01-26 DIAGNOSIS — D631 Anemia in chronic kidney disease: Secondary | ICD-10-CM | POA: Diagnosis not present

## 2018-01-26 DIAGNOSIS — Z23 Encounter for immunization: Secondary | ICD-10-CM | POA: Diagnosis not present

## 2018-01-28 DIAGNOSIS — E1122 Type 2 diabetes mellitus with diabetic chronic kidney disease: Secondary | ICD-10-CM | POA: Diagnosis not present

## 2018-01-28 DIAGNOSIS — Z23 Encounter for immunization: Secondary | ICD-10-CM | POA: Diagnosis not present

## 2018-01-28 DIAGNOSIS — E1151 Type 2 diabetes mellitus with diabetic peripheral angiopathy without gangrene: Secondary | ICD-10-CM | POA: Diagnosis not present

## 2018-01-28 DIAGNOSIS — N186 End stage renal disease: Secondary | ICD-10-CM | POA: Diagnosis not present

## 2018-01-28 DIAGNOSIS — E876 Hypokalemia: Secondary | ICD-10-CM | POA: Diagnosis not present

## 2018-01-28 DIAGNOSIS — I132 Hypertensive heart and chronic kidney disease with heart failure and with stage 5 chronic kidney disease, or end stage renal disease: Secondary | ICD-10-CM | POA: Diagnosis not present

## 2018-01-28 DIAGNOSIS — I5042 Chronic combined systolic (congestive) and diastolic (congestive) heart failure: Secondary | ICD-10-CM | POA: Diagnosis not present

## 2018-01-28 DIAGNOSIS — N2581 Secondary hyperparathyroidism of renal origin: Secondary | ICD-10-CM | POA: Diagnosis not present

## 2018-01-28 DIAGNOSIS — D631 Anemia in chronic kidney disease: Secondary | ICD-10-CM | POA: Diagnosis not present

## 2018-01-30 DIAGNOSIS — I5042 Chronic combined systolic (congestive) and diastolic (congestive) heart failure: Secondary | ICD-10-CM | POA: Diagnosis not present

## 2018-01-30 DIAGNOSIS — E1151 Type 2 diabetes mellitus with diabetic peripheral angiopathy without gangrene: Secondary | ICD-10-CM | POA: Diagnosis not present

## 2018-01-30 DIAGNOSIS — D631 Anemia in chronic kidney disease: Secondary | ICD-10-CM | POA: Diagnosis not present

## 2018-01-30 DIAGNOSIS — E876 Hypokalemia: Secondary | ICD-10-CM | POA: Diagnosis not present

## 2018-01-30 DIAGNOSIS — Z23 Encounter for immunization: Secondary | ICD-10-CM | POA: Diagnosis not present

## 2018-01-30 DIAGNOSIS — E1122 Type 2 diabetes mellitus with diabetic chronic kidney disease: Secondary | ICD-10-CM | POA: Diagnosis not present

## 2018-01-30 DIAGNOSIS — N186 End stage renal disease: Secondary | ICD-10-CM | POA: Diagnosis not present

## 2018-01-30 DIAGNOSIS — N2581 Secondary hyperparathyroidism of renal origin: Secondary | ICD-10-CM | POA: Diagnosis not present

## 2018-01-30 DIAGNOSIS — I132 Hypertensive heart and chronic kidney disease with heart failure and with stage 5 chronic kidney disease, or end stage renal disease: Secondary | ICD-10-CM | POA: Diagnosis not present

## 2018-02-02 DIAGNOSIS — Z23 Encounter for immunization: Secondary | ICD-10-CM | POA: Diagnosis not present

## 2018-02-02 DIAGNOSIS — E1151 Type 2 diabetes mellitus with diabetic peripheral angiopathy without gangrene: Secondary | ICD-10-CM | POA: Diagnosis not present

## 2018-02-02 DIAGNOSIS — E876 Hypokalemia: Secondary | ICD-10-CM | POA: Diagnosis not present

## 2018-02-02 DIAGNOSIS — N2581 Secondary hyperparathyroidism of renal origin: Secondary | ICD-10-CM | POA: Diagnosis not present

## 2018-02-02 DIAGNOSIS — I5042 Chronic combined systolic (congestive) and diastolic (congestive) heart failure: Secondary | ICD-10-CM | POA: Diagnosis not present

## 2018-02-02 DIAGNOSIS — N186 End stage renal disease: Secondary | ICD-10-CM | POA: Diagnosis not present

## 2018-02-02 DIAGNOSIS — I132 Hypertensive heart and chronic kidney disease with heart failure and with stage 5 chronic kidney disease, or end stage renal disease: Secondary | ICD-10-CM | POA: Diagnosis not present

## 2018-02-02 DIAGNOSIS — E1122 Type 2 diabetes mellitus with diabetic chronic kidney disease: Secondary | ICD-10-CM | POA: Diagnosis not present

## 2018-02-02 DIAGNOSIS — D631 Anemia in chronic kidney disease: Secondary | ICD-10-CM | POA: Diagnosis not present

## 2018-02-04 DIAGNOSIS — N186 End stage renal disease: Secondary | ICD-10-CM | POA: Diagnosis not present

## 2018-02-04 DIAGNOSIS — Z23 Encounter for immunization: Secondary | ICD-10-CM | POA: Diagnosis not present

## 2018-02-04 DIAGNOSIS — N2581 Secondary hyperparathyroidism of renal origin: Secondary | ICD-10-CM | POA: Diagnosis not present

## 2018-02-04 DIAGNOSIS — E876 Hypokalemia: Secondary | ICD-10-CM | POA: Diagnosis not present

## 2018-02-04 DIAGNOSIS — D631 Anemia in chronic kidney disease: Secondary | ICD-10-CM | POA: Diagnosis not present

## 2018-02-05 DIAGNOSIS — E1151 Type 2 diabetes mellitus with diabetic peripheral angiopathy without gangrene: Secondary | ICD-10-CM | POA: Diagnosis not present

## 2018-02-05 DIAGNOSIS — N186 End stage renal disease: Secondary | ICD-10-CM | POA: Diagnosis not present

## 2018-02-05 DIAGNOSIS — Z992 Dependence on renal dialysis: Secondary | ICD-10-CM | POA: Diagnosis not present

## 2018-02-05 DIAGNOSIS — I132 Hypertensive heart and chronic kidney disease with heart failure and with stage 5 chronic kidney disease, or end stage renal disease: Secondary | ICD-10-CM | POA: Diagnosis not present

## 2018-02-05 DIAGNOSIS — E876 Hypokalemia: Secondary | ICD-10-CM | POA: Diagnosis not present

## 2018-02-05 DIAGNOSIS — Z23 Encounter for immunization: Secondary | ICD-10-CM | POA: Diagnosis not present

## 2018-02-05 DIAGNOSIS — D631 Anemia in chronic kidney disease: Secondary | ICD-10-CM | POA: Diagnosis not present

## 2018-02-05 DIAGNOSIS — I5042 Chronic combined systolic (congestive) and diastolic (congestive) heart failure: Secondary | ICD-10-CM | POA: Diagnosis not present

## 2018-02-05 DIAGNOSIS — N2581 Secondary hyperparathyroidism of renal origin: Secondary | ICD-10-CM | POA: Diagnosis not present

## 2018-02-05 DIAGNOSIS — E1122 Type 2 diabetes mellitus with diabetic chronic kidney disease: Secondary | ICD-10-CM | POA: Diagnosis not present

## 2018-02-06 ENCOUNTER — Other Ambulatory Visit: Payer: Self-pay | Admitting: Internal Medicine

## 2018-02-06 DIAGNOSIS — N186 End stage renal disease: Secondary | ICD-10-CM | POA: Diagnosis not present

## 2018-02-06 DIAGNOSIS — Z23 Encounter for immunization: Secondary | ICD-10-CM | POA: Diagnosis not present

## 2018-02-06 DIAGNOSIS — N2581 Secondary hyperparathyroidism of renal origin: Secondary | ICD-10-CM | POA: Diagnosis not present

## 2018-02-06 DIAGNOSIS — D631 Anemia in chronic kidney disease: Secondary | ICD-10-CM | POA: Diagnosis not present

## 2018-02-06 DIAGNOSIS — E876 Hypokalemia: Secondary | ICD-10-CM | POA: Diagnosis not present

## 2018-02-08 ENCOUNTER — Other Ambulatory Visit: Payer: Self-pay | Admitting: *Deleted

## 2018-02-08 DIAGNOSIS — I132 Hypertensive heart and chronic kidney disease with heart failure and with stage 5 chronic kidney disease, or end stage renal disease: Secondary | ICD-10-CM | POA: Diagnosis not present

## 2018-02-08 DIAGNOSIS — E1151 Type 2 diabetes mellitus with diabetic peripheral angiopathy without gangrene: Secondary | ICD-10-CM | POA: Diagnosis not present

## 2018-02-08 DIAGNOSIS — E1122 Type 2 diabetes mellitus with diabetic chronic kidney disease: Secondary | ICD-10-CM | POA: Diagnosis not present

## 2018-02-08 DIAGNOSIS — N186 End stage renal disease: Secondary | ICD-10-CM | POA: Diagnosis not present

## 2018-02-08 DIAGNOSIS — I5042 Chronic combined systolic (congestive) and diastolic (congestive) heart failure: Secondary | ICD-10-CM | POA: Diagnosis not present

## 2018-02-08 DIAGNOSIS — D631 Anemia in chronic kidney disease: Secondary | ICD-10-CM | POA: Diagnosis not present

## 2018-02-08 MED ORDER — GLUCOSE BLOOD VI STRP: ORAL_STRIP | 3 refills | Status: DC

## 2018-02-09 ENCOUNTER — Encounter: Payer: Self-pay | Admitting: *Deleted

## 2018-02-09 DIAGNOSIS — D631 Anemia in chronic kidney disease: Secondary | ICD-10-CM | POA: Diagnosis not present

## 2018-02-09 DIAGNOSIS — Z23 Encounter for immunization: Secondary | ICD-10-CM | POA: Diagnosis not present

## 2018-02-09 DIAGNOSIS — E876 Hypokalemia: Secondary | ICD-10-CM | POA: Diagnosis not present

## 2018-02-09 DIAGNOSIS — N186 End stage renal disease: Secondary | ICD-10-CM | POA: Diagnosis not present

## 2018-02-09 DIAGNOSIS — N2581 Secondary hyperparathyroidism of renal origin: Secondary | ICD-10-CM | POA: Diagnosis not present

## 2018-02-10 ENCOUNTER — Telehealth: Payer: Self-pay | Admitting: Oncology

## 2018-02-10 ENCOUNTER — Inpatient Hospital Stay: Payer: Medicare Other

## 2018-02-10 ENCOUNTER — Inpatient Hospital Stay: Payer: Medicare Other | Attending: Oncology | Admitting: Oncology

## 2018-02-10 VITALS — BP 175/60 | HR 94 | Temp 98.3°F | Resp 18 | Ht 65.0 in | Wt 164.5 lb

## 2018-02-10 DIAGNOSIS — C50212 Malignant neoplasm of upper-inner quadrant of left female breast: Secondary | ICD-10-CM | POA: Insufficient documentation

## 2018-02-10 DIAGNOSIS — E1149 Type 2 diabetes mellitus with other diabetic neurological complication: Secondary | ICD-10-CM

## 2018-02-10 DIAGNOSIS — Z7982 Long term (current) use of aspirin: Secondary | ICD-10-CM | POA: Diagnosis not present

## 2018-02-10 DIAGNOSIS — Z923 Personal history of irradiation: Secondary | ICD-10-CM | POA: Diagnosis not present

## 2018-02-10 DIAGNOSIS — N184 Chronic kidney disease, stage 4 (severe): Secondary | ICD-10-CM | POA: Diagnosis not present

## 2018-02-10 DIAGNOSIS — IMO0002 Reserved for concepts with insufficient information to code with codable children: Secondary | ICD-10-CM

## 2018-02-10 DIAGNOSIS — Z79899 Other long term (current) drug therapy: Secondary | ICD-10-CM | POA: Insufficient documentation

## 2018-02-10 DIAGNOSIS — N186 End stage renal disease: Secondary | ICD-10-CM | POA: Diagnosis not present

## 2018-02-10 DIAGNOSIS — I130 Hypertensive heart and chronic kidney disease with heart failure and stage 1 through stage 4 chronic kidney disease, or unspecified chronic kidney disease: Secondary | ICD-10-CM | POA: Insufficient documentation

## 2018-02-10 DIAGNOSIS — I739 Peripheral vascular disease, unspecified: Secondary | ICD-10-CM

## 2018-02-10 DIAGNOSIS — E1151 Type 2 diabetes mellitus with diabetic peripheral angiopathy without gangrene: Secondary | ICD-10-CM | POA: Diagnosis not present

## 2018-02-10 DIAGNOSIS — Z170 Estrogen receptor positive status [ER+]: Secondary | ICD-10-CM | POA: Insufficient documentation

## 2018-02-10 DIAGNOSIS — Z87891 Personal history of nicotine dependence: Secondary | ICD-10-CM

## 2018-02-10 DIAGNOSIS — I132 Hypertensive heart and chronic kidney disease with heart failure and with stage 5 chronic kidney disease, or end stage renal disease: Secondary | ICD-10-CM | POA: Diagnosis not present

## 2018-02-10 DIAGNOSIS — Z992 Dependence on renal dialysis: Secondary | ICD-10-CM | POA: Diagnosis not present

## 2018-02-10 DIAGNOSIS — E1165 Type 2 diabetes mellitus with hyperglycemia: Secondary | ICD-10-CM

## 2018-02-10 DIAGNOSIS — I5042 Chronic combined systolic (congestive) and diastolic (congestive) heart failure: Secondary | ICD-10-CM | POA: Diagnosis not present

## 2018-02-10 DIAGNOSIS — D631 Anemia in chronic kidney disease: Secondary | ICD-10-CM | POA: Diagnosis not present

## 2018-02-10 DIAGNOSIS — E1122 Type 2 diabetes mellitus with diabetic chronic kidney disease: Secondary | ICD-10-CM | POA: Diagnosis not present

## 2018-02-10 LAB — CBC WITH DIFFERENTIAL/PLATELET
Abs Immature Granulocytes: 0.03 10*3/uL (ref 0.00–0.07)
BASOS ABS: 0 10*3/uL (ref 0.0–0.1)
BASOS PCT: 0 %
EOS PCT: 4 %
Eosinophils Absolute: 0.4 10*3/uL (ref 0.0–0.5)
HCT: 35.8 % — ABNORMAL LOW (ref 36.0–46.0)
HEMOGLOBIN: 11.2 g/dL — AB (ref 12.0–15.0)
Immature Granulocytes: 0 %
LYMPHS PCT: 20 %
Lymphs Abs: 1.8 10*3/uL (ref 0.7–4.0)
MCH: 27.7 pg (ref 26.0–34.0)
MCHC: 31.3 g/dL (ref 30.0–36.0)
MCV: 88.6 fL (ref 80.0–100.0)
Monocytes Absolute: 1 10*3/uL (ref 0.1–1.0)
Monocytes Relative: 11 %
NRBC: 0 % (ref 0.0–0.2)
Neutro Abs: 5.7 10*3/uL (ref 1.7–7.7)
Neutrophils Relative %: 65 %
Platelets: 258 10*3/uL (ref 150–400)
RBC: 4.04 MIL/uL (ref 3.87–5.11)
RDW: 17.8 % — ABNORMAL HIGH (ref 11.5–15.5)
WBC: 8.9 10*3/uL (ref 4.0–10.5)

## 2018-02-10 LAB — COMPREHENSIVE METABOLIC PANEL
ALBUMIN: 3.4 g/dL — AB (ref 3.5–5.0)
ALK PHOS: 106 U/L (ref 38–126)
ALT: 18 U/L (ref 0–44)
AST: 22 U/L (ref 15–41)
Anion gap: 11 (ref 5–15)
BUN: 45 mg/dL — ABNORMAL HIGH (ref 8–23)
CHLORIDE: 97 mmol/L — AB (ref 98–111)
CO2: 32 mmol/L (ref 22–32)
CREATININE: 2.73 mg/dL — AB (ref 0.44–1.00)
Calcium: 10 mg/dL (ref 8.9–10.3)
GFR calc non Af Amer: 16 mL/min — ABNORMAL LOW (ref >60)
GFR, EST AFRICAN AMERICAN: 19 mL/min — AB (ref >60)
Glucose, Bld: 52 mg/dL — ABNORMAL LOW (ref 70–99)
Potassium: 3.5 mmol/L (ref 3.5–5.1)
SODIUM: 140 mmol/L (ref 135–145)
Total Bilirubin: 0.5 mg/dL (ref 0.3–1.2)
Total Protein: 6.8 g/dL (ref 6.5–8.1)

## 2018-02-10 NOTE — Progress Notes (Signed)
[facility]  [phone] [phone]    ID: [name] OB: 03/31/1946  MR#: [id number]  CSN#:[id number]  Patient Care Team: McKeown, William, MD as PCP - General (Internal Medicine) Nishan, Peter C, MD as PCP - Cardiology (Cardiology) Magrinat, Gustav C, MD as Consulting Physician (Oncology) Moody, John, MD as Consulting Physician (Radiation Oncology) Duda, Marcus V, MD as Consulting Physician (Orthopedic Surgery) Gherghe, Cristina, MD as Consulting Physician (Internal Medicine) Nishan, Peter C, MD as Consulting Physician (Cardiology) Kaplan, Robert D, MD (Inactive) as Consulting Physician (Gastroenterology) Wakefield, Matthew, MD as Consulting Physician (General Surgery) Digby, Donald, MD as Consulting Physician (Ophthalmology) Upton, Elizabeth, MD as Consulting Physician (Nephrology) Upton, Elizabeth, MD as Consulting Physician (Nephrology) McKeown, William, MD (Internal Medicine)  CHIEF COMPLAINT: Estrogen receptor positive early breast cancer   CURRENT TREATMENT: Anastrozole    BREAST CANCER HISTORY:  From the original intake note:   [name] (also goes by "[name]") had bilateral screening mammography at [facility] 02/02/2013. There was a 3 mm nodule in the left breast which was further evaluated November 4, confirming an irregular mass at the 1:00 position of the left breast, not confirmed by ultrasonography. Biopsy of this mass 02/10/2013 showed an invasive ductal carcinoma, grade 1, with a prognostic panel pending. Breast MRI has been scheduled for 02/24/2013  The patient's subsequent history is as detailed below  INTERVAL HISTORY: [name] returns today for follow-up and treatment of her estrogen receptor positive breast cancer.   The patient continues on anastrozole, with good tolerance.  She has had a mammogram on 01/20/2018 with reportedly no evidence of malignancy.     REVIEW OF SYSTEMS: [name] reports that for exercise, she is working  with a physical therapist with several balance exercises, stretching, and exercises to aid with her posture. She is currently on dialysis and she is tired on her treatment days. She denies unusual headaches, visual changes, nausea, vomiting, or dizziness. There has been no unusual cough, phlegm production, or pleurisy. This been no change in bowel or bladder habits. She denies unexplained fatigue or unexplained weight loss, bleeding, rash, or fever. A detailed review of systems was otherwise stable.     PAST MEDICAL HISTORY: Past Medical History:  Diagnosis Date  . Allergy   . Arthritis    "maybe in my lower back" (08/27/2012)  . 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 Chronic combined systolic and diastolic CHF (congestive heart failure) (HCC)   . CKD (chronic kidney disease), stage IV (HCC)   . Coronary artery disease    a. NSTEMI in 05/2009 s/p CABG (LIMA-LAD, SVG-diagonal, SVG-OM1/OM 2).   . Dyspnea    with exertion  . Family history of anesthesia complication    Mom has a hard time to wake up  . Foot ulcer (HCC)    "I've had them on both feet" (08/27/2012)  . Gouty arthritis    "right index finger" (08/27/2012)  . History of radiation therapy 06/09/13-07/06/13   left breast 50Gy  . Hyperlipidemia   . Hypertension   . Infection    right second toe  . Ischemic cardiomyopathy    a. 2011: EF 40-45%. b. EF 55-60% in 04/2017 but shortly after as inpatient was 45-50%.  . LBBB (left bundle branch block)   . Mitral regurgitation   . Neuropathy    Hx; of B/L feet  . NSTEMI (non-ST elevated myocardial infarction) (HCC) 05/23/2009  . Osteomyelitis of foot (HCC)   . PAD (  peripheral artery disease) (HCC)    a. LE PAD (patient previously elected hold off L fem-pop), prev followed by Dr. Chen  . PONV (postoperative nausea and vomiting)   . Sinus headache    "occasionally" (08/27/2012)  . Type II diabetes mellitus (HCC)   . Vitamin D deficiency       PAST SURGICAL HISTORY: Past Surgical History:  Procedure Laterality Date  . AMPUTATION Right 08/27/2012   Procedure: AMPUTATION RAY;  Surgeon: Marcus V Duda, MD;  Location: [facility] OR;  Service: Orthopedics;  Laterality: Right;  Right Foot 5th Ray Amputation  . AMPUTATION Left 07/08/2013   Procedure: AMPUTATION RAY;  Surgeon: Marcus V Duda, MD;  Location: [facility] OR;  Service: Orthopedics;  Laterality: Left;  Left Foot 2nd Ray Amputation  . AMPUTATION RAY Right 08/27/2012   5th ray/notes 08/27/2012  . AMPUTATION TOE Right 03/06/2017   Procedure: AMPUTATION TOE, INTERPHANGEAL 2ND RIGHT;  Surgeon: Wagoner, Matthew R, DPM;  Location: [facility] OR;  Service: Podiatry;  Laterality: Right;  . AV FISTULA PLACEMENT Right 11/11/2017   Procedure: RIGHT RADIOCEPHALIC  ARTERIOVENOUS FISTULA;  Surgeon: Early, Todd F, MD;  Location: [facility] OR;  Service: Vascular;  Laterality: Right;  . BREAST LUMPECTOMY  04/20/2013   with biopsy      DR WAKEFIELD  . BREAST LUMPECTOMY WITH NEEDLE LOCALIZATION AND AXILLARY SENTINEL LYMPH NODE BX Left 04/20/2013   Procedure: LEFT BREAST WIRE GUIDED LUMPECTOMY AND AXILLARY SENTINEL LYMPH NODE BX;  Surgeon: Matthew Wakefield, MD;  Location: [facility] OR;  Service: General;  Laterality: Left;  . CARDIAC CATHETERIZATION  05/24/2009   /notes 05/24/2009 (08/27/2012)  . CATARACT EXTRACTION W/ INTRAOCULAR LENS  IMPLANT, BILATERAL  2000's  . COLONOSCOPY W/ BIOPSIES AND POLYPECTOMY  2010  . CORONARY ARTERY BYPASS GRAFT  2011   "CABG X4" (08/27/2012)  . DENTAL SURGERY  04/30/11   "1 implant" (08/27/2012)  . DILATION AND CURETTAGE OF UTERUS    . EYE SURGERY    . FINGER SURGERY Right    "index finger; turned out to be gout" (08/27/2012)  . IR FLUORO GUIDE CV LINE RIGHT  11/30/2017  . TEE WITHOUT CARDIOVERSION N/A 12/01/2017   Procedure: TRANSESOPHAGEAL ECHOCARDIOGRAM (TEE);  Surgeon: Crenshaw, Brian S, MD;  Location: [facility] ENDOSCOPY;  Service: Cardiovascular;  Laterality: N/A;    FAMILY HISTORY Family History  Problem  Relation Age of Onset  . Kidney cancer Brother 40  . Hypertension Brother   . Breast cancer Sister 43       LCIS; BRCA negative  . Throat cancer Father 76       smoker  . Breast cancer Sister 46       Lobular breast cancer  . Breast cancer Maternal Aunt        dx in her 40s   patient's father died at the age of 76 from throat cancer. The patient's mother died at the age of 86 from "many medical problems". The patient had 4 brothers, 3 sisters. One brother had kidney cancer diagnosed at age 40. One sister was diagnosed with breast cancer at age 46 and died July of 2014. A second sister was diagnosed with breast cancer at age 43. There is also one of the patient's mother's 5 sisters diagnosed with breast cancer remotely.  GYNECOLOGIC HISTORY:  Menarche age 9 am first live birth age 23, the patient is GX P1. (She had a twin pregnancy, Deborah and Kelly, delivered prematurely, with the babies dying after a half day). She underwent menopause in her   late 1990s and used hormone replacement for a few months.  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 Son Brent lives in [place] where he works as a landlord and handyman. The patient has 3 grandchildren. She attends [employer] in [place]    ADVANCED DIRECTIVES: In place   HEALTH MAINTENANCE: Social History   Tobacco Use  . Smoking status: Former Smoker  . Smokeless tobacco: Never Used  Substance Use Topics  . Alcohol use: Yes    Comment: rarely drinks wine  . Drug use: No     Colonoscopy: 2010  PAP: 2010  Bone density: 10/21/2007 at [facility], normal  Lipid panel:  Allergies  Allergen Reactions  . Atenolol Other (See Comments)    Exacerbates gout Exacerbates gout  . Adhesive [Tape] Other (See Comments)    SKIN IS VERY SENSITIVE AND BRUISES AND TEARS EASILY; PLEASE USE AN ALTERNATIVE TO TAPE!!  . Contrast Media [Iodinated Diagnostic Agents]  Rash  . Latex Rash  . Omnipaque [Iohexol] Rash    Current Outpatient Medications  Medication Sig Dispense Refill  . acetaminophen (TYLENOL) 500 MG tablet Take 1,000 mg by mouth every 6 (six) hours as needed for mild pain or headache.     . allopurinol (ZYLOPRIM) 100 MG tablet TAKE 1 TABLET TWICE DAILY 180 tablet 1  . anastrozole (ARIMIDEX) 1 MG tablet Take 1 tablet (1 mg total) by mouth daily. 90 tablet 3  . aspirin EC 81 MG tablet Take 1 tablet (81 mg total) by mouth daily. (Patient taking differently: Take 81 mg by mouth every evening. )    . atorvastatin (LIPITOR) 80 MG tablet TAKE 1 TABLET DAILY AT 6 PM 90 tablet 1  . BD VEO INSULIN SYRINGE U/F 31G X 15/64" 1 ML MISC USE AS DIRECTED WITH INSULIN 300 each 3  . calcium acetate (PHOSLO) 667 MG capsule Take 1 capsule (667 mg total) by mouth 3 (three) times daily with meals. 90 capsule 0  . carvedilol (COREG) 25 MG tablet TAKE 1 TABLET TWICE DAILY WITH MEALS 180 tablet 1  . Cholecalciferol (VITAMIN D3) 2000 units TABS Take 4,000 Units by mouth daily.     . docusate sodium (COLACE) 100 MG capsule Take 100 mg by mouth at bedtime.    . fluticasone (FLONASE) 50 MCG/ACT nasal spray Place 2 sprays into both nostrils daily as needed for allergies or rhinitis.    . glucose blood (PRODIGY NO CODING BLOOD GLUC) test strip CHECK SUGARS 3 TIMES A DAY.[id number] 300 each 3  . hydrALAZINE (APRESOLINE) 25 MG tablet TAKE 3 TABLETS THREE TIMES DAILY (Patient taking differently: Take 75 mg by mouth 3 (three) times daily. ) 810 tablet 1  . insulin aspart (NOVOLOG) 100 UNIT/ML FlexPen Inject 3 Units into the skin daily. With your largest meal of the day (Patient not taking: Reported on 12/30/2017) 9 mL 1  . insulin NPH-regular Human (NOVOLIN 70/30) (70-30) 100 UNIT/ML injection Inject 10-50 Units into the skin See admin instructions. Inject 50 units into the skin before breakfast and 10-20 units at bedtime    . isosorbide dinitrate (ISORDIL) 10 MG tablet TAKE 1  TABLET THREE TIMES DAILY 270 tablet 1  . Multiple Vitamins-Minerals (OCUVITE EYE HEALTH FORMULA PO) Take 1 tablet by mouth at bedtime.     . NON FORMULARY Fire Cider Health Tonic and Homeopathic Remedy: Mix 1 ounce with water and drink one to two times   a day (5-6 times a week)    . Polyethyl Glycol-Propyl Glycol (LUBRICANT EYE DROPS) 0.4-0.3 % SOLN Place 1-2 drops into both eyes 3 (three) times daily as needed (for dry eyes.).    Marland Kitchen sodium chloride (OCEAN) 0.65 % SOLN nasal spray Place 1 spray into both nostrils as needed for congestion.     No current facility-administered medications for this visit.     OBJECTIVE: Middle-aged white woman in no acute distress  Vitals:   02/10/18 1135  BP: (!) 175/60  Pulse: 94  Resp: 18  Temp: 98.3 F (36.8 C)  SpO2: 95%     Body mass index is 27.37 kg/m.    ECOG FS:1 - Symptomatic but completely ambulatory  Sclerae unicteric, EOMs intact No cervical or supraclavicular adenopathy Lungs no rales or rhonchi Heart regular rate and rhythm Abd soft, nontender, positive bowel sounds MSK no focal spinal tenderness, no upper extremity lymphedema Neuro: nonfocal, well oriented, appropriate affect Breasts: The right breast is unremarkable.  The left breast is status post lumpectomy and radiation.  There is no evidence of disease recurrence.  Both axillae are benign. Skin: Hemodialysis catheter in place right upper anterior chest   LAB RESULTS:  CMP     Component Value Date/Time   NA 141 12/16/2017 1538   NA 143 05/18/2017 1218   NA 143 02/02/2017 1137   K 4.1 12/16/2017 1538   K 3.8 02/02/2017 1137   CL 99 12/16/2017 1538   CO2 36 (H) 12/16/2017 1538   CO2 24 02/02/2017 1137   GLUCOSE 27 (LL) 12/16/2017 1538   GLUCOSE 74 02/02/2017 1137   BUN 20 12/16/2017 1538   BUN 76 (HH) 05/18/2017 1218   BUN 63.4 (H) 02/02/2017 1137   CREATININE 2.43 (H) 12/16/2017 1538   CREATININE 3.0 (HH) 02/02/2017 1137   CALCIUM 10.3 12/16/2017 1538   CALCIUM 9.9  02/02/2017 1137   PROT 6.1 12/16/2017 1538   PROT 7.1 02/02/2017 1137   ALBUMIN 3.2 (L) 12/03/2017 0930   ALBUMIN 3.6 02/02/2017 1137   AST 23 12/16/2017 1538   AST 23 02/02/2017 1137   ALT 11 12/16/2017 1538   ALT 18 02/02/2017 1137   ALKPHOS 101 11/27/2017 0614   ALKPHOS 77 02/02/2017 1137   BILITOT 0.6 12/16/2017 1538   BILITOT 0.31 02/02/2017 1137   GFRNONAA 19 (L) 12/16/2017 1538   GFRAA 22 (L) 12/16/2017 1538    I No results found for: SPEP  Lab Results  Component Value Date   WBC 8.9 02/10/2018   NEUTROABS 5.7 02/10/2018   HGB 11.2 (L) 02/10/2018   HCT 35.8 (L) 02/10/2018   MCV 88.6 02/10/2018   PLT 258 02/10/2018      Chemistry      Component Value Date/Time   NA 141 12/16/2017 1538   NA 143 05/18/2017 1218   NA 143 02/02/2017 1137   K 4.1 12/16/2017 1538   K 3.8 02/02/2017 1137   CL 99 12/16/2017 1538   CO2 36 (H) 12/16/2017 1538   CO2 24 02/02/2017 1137   BUN 20 12/16/2017 1538   BUN 76 (HH) 05/18/2017 1218   BUN 63.4 (H) 02/02/2017 1137   CREATININE 2.43 (H) 12/16/2017 1538   CREATININE 3.0 (HH) 02/02/2017 1137      Component Value Date/Time   CALCIUM 10.3 12/16/2017 1538   CALCIUM 9.9 02/02/2017 1137   ALKPHOS 101 11/27/2017 0614   ALKPHOS 77 02/02/2017 1137   AST 23 12/16/2017 1538   AST 23 02/02/2017  1137   ALT 11 12/16/2017 1538   ALT 18 02/02/2017 1137   BILITOT 0.6 12/16/2017 1538   BILITOT 0.31 02/02/2017 1137       No results found for: LABCA2  No components found for: LABCA125  No results for input(s): INR in the last 168 hours.  Urinalysis    Component Value Date/Time   COLORURINE STRAW (A) 11/26/2017 1818   APPEARANCEUR CLEAR 11/26/2017 1818   LABSPEC 1.008 11/26/2017 1818   PHURINE 5.0 11/26/2017 1818   GLUCOSEU NEGATIVE 11/26/2017 1818   HGBUR NEGATIVE 11/26/2017 1818   BILIRUBINUR NEGATIVE 11/26/2017 1818   KETONESUR NEGATIVE 11/26/2017 1818   PROTEINUR 30 (A) 11/26/2017 1818   UROBILINOGEN 0.2 05/30/2009 1500    NITRITE NEGATIVE 11/26/2017 1818   LEUKOCYTESUR NEGATIVE 11/26/2017 1818    STUDIES: No results found.    ASSESSMENT: 72 y.o. BRCA negative [place] woman status post left breast upper inner quadrant biopsy 02/10/2013 for a clinical T1a N0, stage IA invasive ductal carcinoma, grade 1, with a prognostic panel pending  (1) status post left lumpectomy and sentinel lymph node sampling 04/20/2013 for a pT1a pN0, stage IA invasive ductal carcinoma, grade 1, with negative margins  (2) completed adjuvant radiation 07/06/2013  (3) started anastrozole 09/05/2013;   (a) DEXA scan 07/28/2013 was normal with a T score of -0.3  (b) repeat DEXA scan at [facility] December 25, 2016 showed a T score of -0.5  PLAN: Mertis is now 5 years out from definitive surgery for her breast cancer with no evidence of disease recurrence.  This is very favorable.  She has tolerated anastrozole remarkably well.  At this point she would prefer to stop the drug, even though it is a little bit short of 5 years, because she is on many other medications and of course her health overall has changed with her heart/valve problems and now the hemodialysis.  I explained that she does not need to taper off the medication she can simply stop.   She has an excellent prognosis as far as breast cancer is concerned  At this point I am comfortable releasing her to her primary care physicians care.  All she will need in terms of breast cancer follow-up is her yearly mammography and a yearly physician breast exam  I will be glad to see Avynn again at any point in the future if and when the need arises but as of now we are not making any further routine follow-up visits for her here   Magrinat, Gustav C, MD  02/10/18 11:41 AM Medical Oncology and Hematology [facility] [address] [place], [place] [address] Tel. [phone]    Fax. [phone]    I, Soijett Blue am acting as scribe for Dr. Gustav C.  Magrinat.  I, Gustav Magrinat MD, have reviewed the above documentation for accuracy and completeness, and I agree with the above.    

## 2018-02-10 NOTE — Telephone Encounter (Signed)
No 11/6 los orders.

## 2018-02-11 DIAGNOSIS — N2581 Secondary hyperparathyroidism of renal origin: Secondary | ICD-10-CM | POA: Diagnosis not present

## 2018-02-11 DIAGNOSIS — D631 Anemia in chronic kidney disease: Secondary | ICD-10-CM | POA: Diagnosis not present

## 2018-02-11 DIAGNOSIS — Z23 Encounter for immunization: Secondary | ICD-10-CM | POA: Diagnosis not present

## 2018-02-11 DIAGNOSIS — N186 End stage renal disease: Secondary | ICD-10-CM | POA: Diagnosis not present

## 2018-02-11 DIAGNOSIS — E876 Hypokalemia: Secondary | ICD-10-CM | POA: Diagnosis not present

## 2018-02-13 DIAGNOSIS — D631 Anemia in chronic kidney disease: Secondary | ICD-10-CM | POA: Diagnosis not present

## 2018-02-13 DIAGNOSIS — Z23 Encounter for immunization: Secondary | ICD-10-CM | POA: Diagnosis not present

## 2018-02-13 DIAGNOSIS — E876 Hypokalemia: Secondary | ICD-10-CM | POA: Diagnosis not present

## 2018-02-13 DIAGNOSIS — N2581 Secondary hyperparathyroidism of renal origin: Secondary | ICD-10-CM | POA: Diagnosis not present

## 2018-02-13 DIAGNOSIS — N186 End stage renal disease: Secondary | ICD-10-CM | POA: Diagnosis not present

## 2018-02-16 DIAGNOSIS — N186 End stage renal disease: Secondary | ICD-10-CM | POA: Diagnosis not present

## 2018-02-16 DIAGNOSIS — E876 Hypokalemia: Secondary | ICD-10-CM | POA: Diagnosis not present

## 2018-02-16 DIAGNOSIS — D631 Anemia in chronic kidney disease: Secondary | ICD-10-CM | POA: Diagnosis not present

## 2018-02-16 DIAGNOSIS — N2581 Secondary hyperparathyroidism of renal origin: Secondary | ICD-10-CM | POA: Diagnosis not present

## 2018-02-16 DIAGNOSIS — Z23 Encounter for immunization: Secondary | ICD-10-CM | POA: Diagnosis not present

## 2018-02-18 DIAGNOSIS — N2581 Secondary hyperparathyroidism of renal origin: Secondary | ICD-10-CM | POA: Diagnosis not present

## 2018-02-18 DIAGNOSIS — E876 Hypokalemia: Secondary | ICD-10-CM | POA: Diagnosis not present

## 2018-02-18 DIAGNOSIS — D631 Anemia in chronic kidney disease: Secondary | ICD-10-CM | POA: Diagnosis not present

## 2018-02-18 DIAGNOSIS — Z23 Encounter for immunization: Secondary | ICD-10-CM | POA: Diagnosis not present

## 2018-02-18 DIAGNOSIS — N186 End stage renal disease: Secondary | ICD-10-CM | POA: Diagnosis not present

## 2018-02-20 DIAGNOSIS — D631 Anemia in chronic kidney disease: Secondary | ICD-10-CM | POA: Diagnosis not present

## 2018-02-20 DIAGNOSIS — E876 Hypokalemia: Secondary | ICD-10-CM | POA: Diagnosis not present

## 2018-02-20 DIAGNOSIS — N2581 Secondary hyperparathyroidism of renal origin: Secondary | ICD-10-CM | POA: Diagnosis not present

## 2018-02-20 DIAGNOSIS — N186 End stage renal disease: Secondary | ICD-10-CM | POA: Diagnosis not present

## 2018-02-20 DIAGNOSIS — Z23 Encounter for immunization: Secondary | ICD-10-CM | POA: Diagnosis not present

## 2018-02-23 DIAGNOSIS — D631 Anemia in chronic kidney disease: Secondary | ICD-10-CM | POA: Diagnosis not present

## 2018-02-23 DIAGNOSIS — N2581 Secondary hyperparathyroidism of renal origin: Secondary | ICD-10-CM | POA: Diagnosis not present

## 2018-02-23 DIAGNOSIS — N186 End stage renal disease: Secondary | ICD-10-CM | POA: Diagnosis not present

## 2018-02-23 DIAGNOSIS — Z23 Encounter for immunization: Secondary | ICD-10-CM | POA: Diagnosis not present

## 2018-02-23 DIAGNOSIS — E876 Hypokalemia: Secondary | ICD-10-CM | POA: Diagnosis not present

## 2018-02-25 DIAGNOSIS — D631 Anemia in chronic kidney disease: Secondary | ICD-10-CM | POA: Diagnosis not present

## 2018-02-25 DIAGNOSIS — N2581 Secondary hyperparathyroidism of renal origin: Secondary | ICD-10-CM | POA: Diagnosis not present

## 2018-02-25 DIAGNOSIS — E876 Hypokalemia: Secondary | ICD-10-CM | POA: Diagnosis not present

## 2018-02-25 DIAGNOSIS — N186 End stage renal disease: Secondary | ICD-10-CM | POA: Diagnosis not present

## 2018-02-25 DIAGNOSIS — Z23 Encounter for immunization: Secondary | ICD-10-CM | POA: Diagnosis not present

## 2018-02-27 DIAGNOSIS — Z23 Encounter for immunization: Secondary | ICD-10-CM | POA: Diagnosis not present

## 2018-02-27 DIAGNOSIS — E876 Hypokalemia: Secondary | ICD-10-CM | POA: Diagnosis not present

## 2018-02-27 DIAGNOSIS — N186 End stage renal disease: Secondary | ICD-10-CM | POA: Diagnosis not present

## 2018-02-27 DIAGNOSIS — D631 Anemia in chronic kidney disease: Secondary | ICD-10-CM | POA: Diagnosis not present

## 2018-02-27 DIAGNOSIS — N2581 Secondary hyperparathyroidism of renal origin: Secondary | ICD-10-CM | POA: Diagnosis not present

## 2018-03-01 DIAGNOSIS — N2581 Secondary hyperparathyroidism of renal origin: Secondary | ICD-10-CM | POA: Diagnosis not present

## 2018-03-01 DIAGNOSIS — D631 Anemia in chronic kidney disease: Secondary | ICD-10-CM | POA: Diagnosis not present

## 2018-03-01 DIAGNOSIS — N186 End stage renal disease: Secondary | ICD-10-CM | POA: Diagnosis not present

## 2018-03-01 DIAGNOSIS — E876 Hypokalemia: Secondary | ICD-10-CM | POA: Diagnosis not present

## 2018-03-01 DIAGNOSIS — Z23 Encounter for immunization: Secondary | ICD-10-CM | POA: Diagnosis not present

## 2018-03-02 ENCOUNTER — Ambulatory Visit: Payer: Self-pay | Admitting: Internal Medicine

## 2018-03-03 DIAGNOSIS — Z23 Encounter for immunization: Secondary | ICD-10-CM | POA: Diagnosis not present

## 2018-03-03 DIAGNOSIS — N186 End stage renal disease: Secondary | ICD-10-CM | POA: Diagnosis not present

## 2018-03-03 DIAGNOSIS — D631 Anemia in chronic kidney disease: Secondary | ICD-10-CM | POA: Diagnosis not present

## 2018-03-03 DIAGNOSIS — E876 Hypokalemia: Secondary | ICD-10-CM | POA: Diagnosis not present

## 2018-03-03 DIAGNOSIS — N2581 Secondary hyperparathyroidism of renal origin: Secondary | ICD-10-CM | POA: Diagnosis not present

## 2018-03-06 DIAGNOSIS — N2581 Secondary hyperparathyroidism of renal origin: Secondary | ICD-10-CM | POA: Diagnosis not present

## 2018-03-06 DIAGNOSIS — Z23 Encounter for immunization: Secondary | ICD-10-CM | POA: Diagnosis not present

## 2018-03-06 DIAGNOSIS — D631 Anemia in chronic kidney disease: Secondary | ICD-10-CM | POA: Diagnosis not present

## 2018-03-06 DIAGNOSIS — N186 End stage renal disease: Secondary | ICD-10-CM | POA: Diagnosis not present

## 2018-03-06 DIAGNOSIS — E876 Hypokalemia: Secondary | ICD-10-CM | POA: Diagnosis not present

## 2018-03-07 DIAGNOSIS — N186 End stage renal disease: Secondary | ICD-10-CM | POA: Diagnosis not present

## 2018-03-07 DIAGNOSIS — E1122 Type 2 diabetes mellitus with diabetic chronic kidney disease: Secondary | ICD-10-CM | POA: Diagnosis not present

## 2018-03-07 DIAGNOSIS — Z992 Dependence on renal dialysis: Secondary | ICD-10-CM | POA: Diagnosis not present

## 2018-03-09 DIAGNOSIS — D631 Anemia in chronic kidney disease: Secondary | ICD-10-CM | POA: Diagnosis not present

## 2018-03-09 DIAGNOSIS — E876 Hypokalemia: Secondary | ICD-10-CM | POA: Diagnosis not present

## 2018-03-09 DIAGNOSIS — N186 End stage renal disease: Secondary | ICD-10-CM | POA: Diagnosis not present

## 2018-03-09 DIAGNOSIS — N2581 Secondary hyperparathyroidism of renal origin: Secondary | ICD-10-CM | POA: Diagnosis not present

## 2018-03-11 DIAGNOSIS — D631 Anemia in chronic kidney disease: Secondary | ICD-10-CM | POA: Diagnosis not present

## 2018-03-11 DIAGNOSIS — E876 Hypokalemia: Secondary | ICD-10-CM | POA: Diagnosis not present

## 2018-03-11 DIAGNOSIS — N2581 Secondary hyperparathyroidism of renal origin: Secondary | ICD-10-CM | POA: Diagnosis not present

## 2018-03-11 DIAGNOSIS — N186 End stage renal disease: Secondary | ICD-10-CM | POA: Diagnosis not present

## 2018-03-13 DIAGNOSIS — N186 End stage renal disease: Secondary | ICD-10-CM | POA: Diagnosis not present

## 2018-03-13 DIAGNOSIS — N2581 Secondary hyperparathyroidism of renal origin: Secondary | ICD-10-CM | POA: Diagnosis not present

## 2018-03-13 DIAGNOSIS — E876 Hypokalemia: Secondary | ICD-10-CM | POA: Diagnosis not present

## 2018-03-13 DIAGNOSIS — D631 Anemia in chronic kidney disease: Secondary | ICD-10-CM | POA: Diagnosis not present

## 2018-03-15 ENCOUNTER — Ambulatory Visit (INDEPENDENT_AMBULATORY_CARE_PROVIDER_SITE_OTHER): Payer: Medicare Other | Admitting: Vascular Surgery

## 2018-03-15 ENCOUNTER — Encounter: Payer: Self-pay | Admitting: Vascular Surgery

## 2018-03-15 VITALS — BP 195/86 | HR 98 | Temp 98.4°F | Resp 18 | Ht 65.0 in | Wt 165.8 lb

## 2018-03-15 DIAGNOSIS — Z992 Dependence on renal dialysis: Secondary | ICD-10-CM

## 2018-03-15 DIAGNOSIS — I2510 Atherosclerotic heart disease of native coronary artery without angina pectoris: Secondary | ICD-10-CM

## 2018-03-15 DIAGNOSIS — I2583 Coronary atherosclerosis due to lipid rich plaque: Secondary | ICD-10-CM | POA: Diagnosis not present

## 2018-03-15 DIAGNOSIS — N186 End stage renal disease: Secondary | ICD-10-CM | POA: Diagnosis not present

## 2018-03-15 NOTE — Progress Notes (Signed)
[facility] [place]  Patient name: [name] MRN: [id number] DOB: September 23, 1945 Sex: female  REASON FOR VISIT: Evaluation of infiltration right AV fistula  HPI: [name] is a 72 y.o. female here for evaluation of right AV fistula.  She had a radiocephalic fistula creation by myself in August 2019.  Does have a right IJ catheter which is being used for dialysis.  She had several uses of her fistula and on the third fistula use had a significant infiltration and is now using her catheter again.  She is seeing me today for reevaluation of the fistula.  She also reports severe discomfort when using her fistula.  Past Medical History:  Diagnosis Date  . Allergy   . Arthritis    "maybe in my lower back" (08/27/2012)  . 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 Chronic combined systolic and diastolic CHF (congestive heart failure) ([place])   . CKD (chronic kidney disease), stage IV ([place])   . Coronary artery disease    a. NSTEMI in 05/2009 s/p CABG (LIMA-LAD, SVG-diagonal, SVG-OM1/OM 2).   Marland Kitchen Dyspnea    with exertion  . Family history of anesthesia complication    Mom has a hard time to wake up  . Foot ulcer ([place])    "I've had them on both feet" (08/27/2012)  . Gouty arthritis    "right index finger" (08/27/2012)  . History of radiation therapy 06/09/13-07/06/13   left breast 50Gy  . Hyperlipidemia   . Hypertension   . Infection    right second toe  . Ischemic cardiomyopathy    a. 2011: EF 40-45%. b. EF 55-60% in 04/2017 but shortly after as inpatient was 45-50%.  Marland Kitchen LBBB (left bundle branch block)   . Mitral regurgitation   . Neuropathy    Hx; of B/L feet  . NSTEMI (non-ST elevated myocardial infarction) ([employer]) 05/23/2009  . Osteomyelitis of foot ([place])   . PAD (peripheral artery disease) ([facility])    a. LE PAD (patient previously elected hold off L fem-pop), prev followed by Dr. Bridgett Larsson   . PONV (postoperative nausea and vomiting)   . Sinus headache    "occasionally" (08/27/2012)  . Type II diabetes mellitus ([place])   . Vitamin D deficiency     Family History  Problem Relation Age of Onset  . Kidney cancer Brother 61  . Hypertension Brother   . Breast cancer Sister 74       LCIS; BRCA negative  . Throat cancer Father 48       smoker  . Breast cancer Sister 68       Lobular breast cancer  . Breast cancer Maternal Aunt        dx in her 62s    SOCIAL HISTORY: Social History   Tobacco Use  . Smoking status: Former Research scientist (life sciences)  . Smokeless tobacco: Never Used  Substance Use Topics  . Alcohol use: Yes    Comment: rarely drinks wine    Allergies  Allergen Reactions  . Atenolol Other (See Comments)    Exacerbates gout Exacerbates gout  . Adhesive [Tape] Other (See Comments)    SKIN IS VERY SENSITIVE AND BRUISES AND TEARS EASILY; PLEASE USE AN ALTERNATIVE TO TAPE!!  . Contrast Media [Iodinated Diagnostic Agents] Rash  . Latex Rash  . Omnipaque [Iohexol] Rash    Current Outpatient Medications  Medication Sig Dispense Refill  . acetaminophen (TYLENOL) 500 MG tablet Take 1,000  mg by mouth every 6 (six) hours as needed for mild pain or headache.     . allopurinol (ZYLOPRIM) 100 MG tablet TAKE 1 TABLET TWICE DAILY 180 tablet 1  . aspirin EC 81 MG tablet Take 1 tablet (81 mg total) by mouth daily. (Patient taking differently: Take 81 mg by mouth every evening. )    . atorvastatin (LIPITOR) 80 MG tablet Take 0.5 tablets (40 mg total) by mouth daily at 6 PM. 90 tablet 1  . BD VEO INSULIN SYRINGE U/F 31G X 15/64" 1 ML MISC USE AS DIRECTED WITH INSULIN 300 each 3  . Cholecalciferol (VITAMIN D3) 2000 units TABS Take 4,000 Units by mouth daily.     Marland Kitchen docusate sodium (COLACE) 100 MG capsule Take 100 mg by mouth at bedtime.    . fluticasone (FLONASE) 50 MCG/ACT nasal spray Place 2 sprays into both nostrils daily as needed for allergies or rhinitis.    Marland Kitchen glucose blood (PRODIGY  NO CODING BLOOD GLUC) test strip CHECK SUGARS 3 TIMES A DAY.[id number] 300 each 3  . hydrALAZINE (APRESOLINE) 25 MG tablet TAKE 3 TABLETS THREE TIMES DAILY (Patient taking differently: Take 75 mg by mouth 3 (three) times daily. ) 810 tablet 1  . insulin NPH-regular Human (NOVOLIN 70/30) (70-30) 100 UNIT/ML injection Inject 10-50 Units into the skin See admin instructions. Inject 50 units into the skin before breakfast and 10-20 units at bedtime    . isosorbide dinitrate (ISORDIL) 10 MG tablet TAKE 1 TABLET THREE TIMES DAILY 270 tablet 1  . Multiple Vitamins-Minerals (OCUVITE EYE HEALTH FORMULA PO) Take 1 tablet by mouth at bedtime.     . NON FORMULARY [employer] Tonic and Homeopathic Remedy: Mix 1 ounce with water and drink one to two times a day (5-6 times a week)    . Polyethyl Glycol-Propyl Glycol (LUBRICANT EYE DROPS) 0.4-0.3 % SOLN Place 1-2 drops into both eyes 3 (three) times daily as needed (for dry eyes.).    Marland Kitchen sodium chloride (OCEAN) 0.65 % SOLN nasal spray Place 1 spray into both nostrils as needed for congestion.     No current facility-administered medications for this visit.     REVIEW OF SYSTEMS:  [X] denotes positive finding, [ ] denotes negative finding Cardiac  Comments:  Chest pain or chest pressure:    Shortness of breath upon exertion:    Short of breath when lying flat:    Irregular heart rhythm:        Vascular    Pain in calf, thigh, or hip brought on by ambulation:    Pain in feet at night that wakes you up from your sleep:     Blood clot in your veins:    Leg swelling:           PHYSICAL EXAM: Vitals:   03/15/18 1508  BP: (!) 195/86  Pulse: 98  Resp: 18  Temp: 98.4 F (36.9 C)  TempSrc: Temporal  Weight: 165 lb 12.8 oz (75.2 kg)  Height: 5' 5" (1.651 m)    GENERAL: The patient is a well-nourished female, in no acute distress. The vital signs are documented above. CARDIOVASCULAR: She has a very well developed right forearm radiocephalic  fistula.  The vein diameter is very nice throughout the forearm and does run in a straight course throughout the forearm.  She does have bruising on the ulnar aspect of her forearm from a prior infiltration.  No evidence of false aneurysm and no evidence of skin breakdown  PULMONARY: There is good air exchange  MUSCULOSKELETAL: There are no major deformities or cyanosis. NEUROLOGIC: No focal weakness or paresthesias are detected. SKIN: There are no ulcers or rashes noted. PSYCHIATRIC: The patient has a normal affect.  DATA:  None  MEDICAL ISSUES: Had a long discussion with the patient.  I do not see any technical issues with the fistula.  It is a good diameter, straight in its course and superficial.  I have recommended that she begin attempting use of the fistula again.  Explained that occasionally recurrent infiltrations require abandonment of even a good appearing fistula.  She also has questions regarding peritoneal dialysis and I I did explain my knowledge of this.  I explained that this would be discussion with the nephrologist and the general surgery would place a dialysis peritoneal catheter if this was decided upon.  We will see her again on an as-needed basis    [name], MD [facility] Tel [phone] Pager [phone]

## 2018-03-16 DIAGNOSIS — D631 Anemia in chronic kidney disease: Secondary | ICD-10-CM | POA: Diagnosis not present

## 2018-03-16 DIAGNOSIS — E876 Hypokalemia: Secondary | ICD-10-CM | POA: Diagnosis not present

## 2018-03-16 DIAGNOSIS — N2581 Secondary hyperparathyroidism of renal origin: Secondary | ICD-10-CM | POA: Diagnosis not present

## 2018-03-16 DIAGNOSIS — N186 End stage renal disease: Secondary | ICD-10-CM | POA: Diagnosis not present

## 2018-03-18 DIAGNOSIS — D631 Anemia in chronic kidney disease: Secondary | ICD-10-CM | POA: Diagnosis not present

## 2018-03-18 DIAGNOSIS — N186 End stage renal disease: Secondary | ICD-10-CM | POA: Diagnosis not present

## 2018-03-18 DIAGNOSIS — N2581 Secondary hyperparathyroidism of renal origin: Secondary | ICD-10-CM | POA: Diagnosis not present

## 2018-03-18 DIAGNOSIS — E876 Hypokalemia: Secondary | ICD-10-CM | POA: Diagnosis not present

## 2018-03-18 NOTE — Progress Notes (Deleted)
CARDIOLOGY OFFICE NOTE  Date:  03/18/2018    [name] Date of Birth: 1945-11-03 Medical Record #[id number]  PCP:  Unk Pinto, MD  Cardiologist:  Servando Snare & formerly Johnsie Cancel   No chief complaint on file.   History of Present Illness: 72 y.o. f/u CAD CABG 2011 with Dr Cyndia Bent Also has PVD worse in LE and needs left fem-pop. Stage 4 kidney disease Sees [facility];s include HTN, HLD, DM.  Chronic LBBB. 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 Saw Dr Early for evaluation of  right her right radiocephalic fistula on 23/7/62 for infiltrations   Using Right IJ for dialysis Cr up into the 3.2 range now   ***     Past Medical History:  Diagnosis Date  . Allergy   . Arthritis    "maybe in my lower back" (08/27/2012)  . 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 Chronic combined systolic and diastolic CHF (congestive heart failure) ([place])   . CKD (chronic kidney disease), stage IV ([place])   . Coronary artery disease    a. NSTEMI in 05/2009 s/p CABG (LIMA-LAD, SVG-diagonal, SVG-OM1/OM 2).   Marland Kitchen Dyspnea    with exertion  . Family history of anesthesia complication    Mom has a hard time to wake up  . Foot ulcer ([place])    "I've had them on both feet" (08/27/2012)  . Gouty arthritis    "right index finger" (08/27/2012)  . History of radiation therapy 06/09/13-07/06/13   left breast 50Gy  . Hyperlipidemia   . Hypertension   . Infection    right second toe  . Ischemic cardiomyopathy    a. 2011: EF 40-45%. b. EF 55-60% in 04/2017 but shortly after as inpatient was 45-50%.  Marland Kitchen LBBB (left bundle branch block)   . Mitral regurgitation   . Neuropathy    Hx; of B/L feet  . NSTEMI (non-ST elevated myocardial infarction) ([place]) 05/23/2009  . Osteomyelitis of foot ([place])   . PAD (peripheral artery disease) (H[facility])    a. LE PAD (patient previously  elected hold off L fem-pop), prev followed by Dr. Bridgett Larsson  . PONV (postoperative nausea and vomiting)   . Sinus headache    "occasionally" (08/27/2012)  . Type II diabetes mellitus ([place])   . Vitamin D deficiency     Past Surgical History:  Procedure Laterality Date  . AMPUTATION Right 08/27/2012   Procedure: AMPUTATION RAY;  Surgeon: Newt Minion, MD;  Location: [facility];  Service: Orthopedics;  Laterality: Right;  Right Foot 5th Ray Amputation  . AMPUTATION Left 07/08/2013   Procedure: AMPUTATION RAY;  Surgeon: Newt Minion, MD;  Location: [facility];  Service: Orthopedics;  Laterality: Left;  Left Foot 2nd Ray Amputation  . AMPUTATION RAY Right 08/27/2012   5th ray/notes 08/27/2012  . AMPUTATION TOE Right 03/06/2017   Procedure: AMPUTATION TOE, INTERPHANGEAL 2ND RIGHT;  Surgeon: [name], DPM;  Location: [facility];  Service: Podiatry;  Laterality: Right;  . AV FISTULA PLACEMENT Right 11/11/2017   Procedure: RIGHT RADIOCEPHALIC  ARTERIOVENOUS FISTULA;  Surgeon: [name], MD;  Location: [facility];  Service: Vascular;  Laterality: Right;  . BREAST LUMPECTOMY  04/20/2013   with biopsy      DR WAKEFIELD  . BREAST LUMPECTOMY WITH NEEDLE LOCALIZATION AND AXILLARY SENTINEL LYMPH NODE BX Left 04/20/2013   Procedure: LEFT  BREAST WIRE GUIDED LUMPECTOMY AND AXILLARY SENTINEL LYMPH NODE BX;  Surgeon: Rolm Bookbinder, MD;  Location: [facility];  Service: General;  Laterality: Left;  . CARDIAC CATHETERIZATION  05/24/2009   Archie Endo 05/24/2009 (08/27/2012)  . CATARACT EXTRACTION W/ INTRAOCULAR LENS  IMPLANT, BILATERAL  2000's  . COLONOSCOPY W/ BIOPSIES AND POLYPECTOMY  2010  . CORONARY ARTERY BYPASS GRAFT  2011   "CABG X4" (08/27/2012)  . DENTAL SURGERY  04/30/11   "1 implant" (08/27/2012)  . DILATION AND CURETTAGE OF UTERUS    . EYE SURGERY    . FINGER SURGERY Right    "index finger; turned out to be gout" (08/27/2012)  . IR FLUORO GUIDE CV LINE RIGHT  11/30/2017  . TEE WITHOUT CARDIOVERSION N/A 12/01/2017    Procedure: TRANSESOPHAGEAL ECHOCARDIOGRAM (TEE);  Surgeon: Lelon Perla, MD;  Location: [facility] ENDOSCOPY;  Service: Cardiovascular;  Laterality: N/A;     Medications: No outpatient medications have been marked as taking for the 03/19/18 encounter (Appointment) with Josue Hector, MD.     Allergies: Allergies  Allergen Reactions  . Atenolol Other (See Comments)    Exacerbates gout Exacerbates gout  . Adhesive [Tape] Other (See Comments)    SKIN IS VERY SENSITIVE AND BRUISES AND TEARS EASILY; PLEASE USE AN ALTERNATIVE TO TAPE!!  . Contrast Media [Iodinated Diagnostic Agents] Rash  . Latex Rash  . Omnipaque [Iohexol] Rash    Social History: The patient  reports that she has quit smoking. She has never used smokeless tobacco. She reports current alcohol use. She reports that she does not use drugs.   Family History: The patient's family history includes Breast cancer in her maternal aunt; Breast cancer (age of onset: 60) in her sister; Breast cancer (age of onset: 72) in her sister; Hypertension in her brother; Kidney cancer (age of onset: 66) in her brother; Throat cancer (age of onset: 48) in her father.   Review of Systems: Please see the history of present illness.   Otherwise, the review of systems is positive for none.   All other systems are reviewed and negative.   Physical Exam: VS:  There were no vitals taken for this visit. Marland Kitchen  BMI There is no height or weight on file to calculate BMI.  Wt Readings from Last 3 Encounters:  03/15/18 165 lb 12.8 oz (75.2 kg)  02/10/18 164 lb 8 oz (74.6 kg)  12/30/17 160 lb (72.6 kg)   Affect appropriate Chronically ill white female  HEENT: right IJ dialysis catheter  Neck supple with no adenopathy JVP normal no bruits no thyromegaly Lungs clear with no wheezing and good diaphragmatic motion Heart:  S1/S2 no murmur, no rub, gallop or click PMI normal Abdomen: benighn, BS positve, no tenderness, no AAA no bruit.  No HSM or  HJR Decreased peripheral pulses worse on left  Amputation right foot 4 th/ 5 th digits  Neuro non-focal Skin warm and dry No muscular weakness    LABORATORY DATA:  EKG:   11/29/17 SR LBBB chronic no acute changes   Lab Results  Component Value Date   WBC 8.9 02/10/2018   HGB 11.2 (L) 02/10/2018   HCT 35.8 (L) 02/10/2018   PLT 258 02/10/2018   GLUCOSE 52 (L) 02/10/2018   CHOL 147 08/04/2017   TRIG 196 (H) 08/04/2017   HDL 29 (L) 08/04/2017   LDLCALC 89 08/04/2017   ALT 18 02/10/2018   AST 22 02/10/2018   NA 140 02/10/2018   K 3.5 02/10/2018  CL 97 (L) 02/10/2018   CREATININE 2.73 (H) 02/10/2018   BUN 45 (H) 02/10/2018   CO2 32 02/10/2018   TSH 2.307 11/26/2017   INR 1.17 11/30/2017   HGBA1C 6.1 (H) 11/26/2017   MICROALBUR 118.3 08/04/2017     BNP (last 3 results) Recent Labs    05/03/17 1951 11/03/17 0828 11/26/17 1332  BNP 784.6* 1,413.2* 2,382.1*    ProBNP (last 3 results) No results for input(s): PROBNP in the last 8760 hours.   Other Studies Reviewed Today:  Myoview Study Highlights 05/2017    Nuclear stress EF: 44%.  There was no ST segment deviation noted during stress.  The study is normal.  This is a low risk study.  The left ventricular ejection fraction is mildly decreased (45-54%).   Normal pharmacologic nuclear stress test with no evidence for prior infarct or ischemia.  Mildly decreased LVEF calculated at 44%.      2D Echo 05/04/17 Study Conclusions  - Left ventricle: The cavity size was normal. Wall thickness was increased in a pattern of mild LVH. Systolic function was mildly reduced. The estimated ejection fraction was in the range of 45% to 50%. Diffuse hypokinesis. Features are consistent with a pseudonormal left ventricular filling pattern, with concomitant abnormal relaxation and increased filling pressure (grade 2 diastolic dysfunction). - Aortic valve: Poorly visualized. Probably trileaflet;  mildly calcified leaflets. There was no stenosis. - Mitral valve: Severely calcified annulus. Moderately calcified leaflets . The findings are consistent with trivial stenosis. There was moderate regurgitation. Pressure half-time: 77 ms. Mean gradient (D): 6 mm Hg. - Left atrium: The atrium was mildly dilated. - Right ventricle: The cavity size was normal. Systolic function was normal. - Pulmonary arteries: PA peak pressure: 32 mm Hg (S). - Inferior vena cava: The vessel was normal in size. The respirophasic diameter changes were in the normal range (>= 50%), consistent with normal central venous pressure.  Impressions:  - Normal LV size with mild LV hypertrophy. EF 45-50%, diffuse hypokinesis. Normal RV size and systolic function. Heavily calcified mitral valve and annulus. There was moderate mitral regurgitation and probably only trivial mitral stenosis (mean gradient 6 mmHg but PHT 77 msec suggesting valve area in normal range).    Assessment/Plan:  1.Chronic combined systolic and diastolic CHF - EF 81-85% TEE August 2019 improved with dialysis   2. CAD with remote MI/CABG - multiple risk factors - her Myovew from2/14/19  was felt to be low risk.   3. HLD - on high dose statin   4. Moderate MR on TEE August 2019 follow   5. HTN - better control noted at home. For now, no change in her regimen.   6. CKD - f/u Hollie Salk and Early regarding access Cr baseline now 3.2   Jenkins Rouge

## 2018-03-19 ENCOUNTER — Ambulatory Visit: Payer: Medicare Other | Admitting: Cardiovascular Disease

## 2018-03-19 NOTE — Progress Notes (Signed)
Patient ID: [name], female   DOB: 05-08-1945, 72 y.o.   MRN: [id number]  MEDICARE ANNUAL WELLNESS VISIT AND FOLLOW UP  Assessment:   Diagnoses and all orders for this visit:  Encounter for Medicare annual wellness exam  Peripheral vascular disease ([place]) Control blood pressure, cholesterol, glucose, increase exercise.   Chronic kidney disease (CKD), active medical management without dialysis, stage 5 ([place]) On dialysis, followed by nephrology  Coronary artery disease involving coronary bypass graft of native heart without angina pectoris Control blood pressure, cholesterol, glucose, increase exercise.  Followed by Dr. Johnsie Cancel  Chronic combined systolic and diastolic congestive heart failure ([place]) Managed by dialysis Encouraged daily monitoring of the patient's weight, call office if 5 lb weight loss or gain in a day.  Encouraged regular exercise. If any increasing shortness of breath, swelling, or chest pressure go to ER immediately.  decrease your fluid intake to less than 2 L daily please remember to always increase your potassium intake with any increase of your fluid pill.   ESRD (end stage renal disease) ([place]) Continue dialysis Followed by Dr. Hollie Salk & colleagues  Type II diabetes mellitus with neurological manifestations, uncontrolled ([place]) Discussed general issues about diabetes pathophysiology and management., Educational material distributed., Suggested low cholesterol diet., Encouraged aerobic exercise., Discussed foot care., Reminded to get yearly retinal exam. -     Hemoglobin A1c - REMINDED TO GET EYE EXAM  Diabetes mellitus type 2, insulin dependent ([place]) Discussed general issues about diabetes pathophysiology and management., Educational material distributed., Suggested low cholesterol diet., Encouraged aerobic exercise., Discussed foot care., Reminded to get yearly retinal exam. -     Hemoglobin A1c - REMINDED TO GET EYE EXAM  EMPHYSEMA Well controlled off  of inhalers, continue to monitor  Asthma with status asthmaticus, unspecified asthma severity, unspecified whether persistent Well controlled off of inhalers, continue to monitor  Malignant neoplasm of upper-inner quadrant of left breast in female, estrogen receptor positive ([place]) Off of anastrozole after 5 years, followed by Dr. Jana Hakim  Essential hypertension Managed via dialysis Monitor blood pressure at home; call if consistently over 130/80 Continue DASH diet.   Reminder to go to the ER if any CP, SOB, nausea, dizziness, severe HA, changes vision/speech, left arm numbness and tingling and jaw pain.  Mitral valve mass Follow up with Dr. Johnsie Cancel as recommended  Gouty arthritis Continue allopurinol Diet discussed Check uric acid as needed  Vitamin D deficiency At goal at recent check; continue to recommend supplementation for goal of 70-100 Defer vitamin D level  Medication management CBC, CMP/GFR, magnesium  Hyperlipidemia Continue medications Continue low cholesterol diet and exercise.  Check lipid panel.   Bradycardia Monitor  BMI 29.0-29.9,adult  Anemia, of chronic disease -CBC    Over 30 minutes of exam, counseling, chart review, and critical decision making was performed   Future Appointments  Date Time Provider [place]  04/05/2018 10:15 AM Josue Hector, MD [facility]  09/07/2018  3:00 PM Unk Pinto, MD [facility] None     Plan:   During the course of the visit the patient was educated and counseled about appropriate screening and preventive services including:    Pneumococcal vaccine   Influenza vaccine  Td vaccine  Prevnar 13  Screening electrocardiogram  Screening mammography  Bone densitometry screening  Colorectal cancer screening  Diabetes screening  Glaucoma screening  Nutrition counseling   Advanced directives: given info/requested copies    Subjective:   [name] is a 72 y.o.  female who  presents for Medicare Annual Wellness Visit and 3 month follow up on hypertension, diabetes, hyperlipidemia, vitamin D def.   She has had breast cancer in 2014, follows with [name], she completed 5 years on anastrozole.  She has ESRD recently initiated on hemodialysis, Tues, Thurs, Sat, after exacerbatin of CHF in 11/2017; she is followed by Dr. Hollie Salk & colleagues.   While hospitalized [place] density on the mitral valve was incidentally found TTE on 8/23: confirmed by TEE on 8/27. Blood cultures were negative, clinical presentation did no suggest endocarditis, per inpatient cardiology thought to be a calcified subchondral apparatus without further recommendation for workup at the time. Outpatient cardiology was recommended, she has scheduled with Dr. Johnsie Cancel 12/30.   BMI is Body mass index is 27.29 kg/m., she has not been working on diet and exercise. Wt Readings from Last 3 Encounters:  03/22/18 164 lb (74.4 kg)  03/15/18 165 lb 12.8 oz (75.2 kg)  02/10/18 164 lb 8 oz (74.6 kg)   Her blood pressure has been controlled at home, today their BP is BP: (!) 148/84 not on medications secondary to dialysis She does not workout. She denies chest pain, shortness of breath, dizziness.   She is on cholesterol medication atorvastatin 40 mg daily and denies myalgias. Her cholesterol is at goal. The cholesterol last visit was:   Lab Results  Component Value Date   CHOL 147 08/04/2017   HDL 29 (L) 08/04/2017   LDLCALC 89 08/04/2017   TRIG 196 (H) 08/04/2017   CHOLHDL 5.1 (H) 08/04/2017   She has been working on diet and exercise for Diabetes with diabetic chronic kidney disease, with other circulatory complications and with diabetic polyneuropathy, she is on bASA, she is not on ACE/ARB, she is on novolin 70/30, taking 15-20 units and 10-20 in the evening, and denies paresthesia of the feet, polydipsia, polyuria and visual disturbances. Fasting has been well controlled, 80-120. Last A1C  was:  Lab Results  Component Value Date   HGBA1C 6.1 (H) 11/26/2017   Last GFR  Lab Results  Component Value Date   GFRNONAA 16 (L) 02/10/2018   Patient is on Vitamin D supplement. Lab Results  Component Value Date   VD25OH 48 08/04/2017     Patient is on allopurinol for gout and does not report a recent flare.  Lab Results  Component Value Date   LABURIC 6.2 08/04/2017  '   Medication Review Current Outpatient Medications on File Prior to Visit  Medication Sig Dispense Refill  . allopurinol (ZYLOPRIM) 100 MG tablet TAKE 1 TABLET TWICE DAILY 180 tablet 1  . aspirin EC 81 MG tablet Take 1 tablet (81 mg total) by mouth daily.    . Aspirin-Acetaminophen-Caffeine (EXCEDRIN EXTRA STRENGTH PO) Take by mouth as needed.    Marland Kitchen atorvastatin (LIPITOR) 80 MG tablet Take 40 mg by mouth 3 (three) times a week.  90 tablet 1  . BD VEO INSULIN SYRINGE U/F 31G X 15/64" 1 ML MISC USE AS DIRECTED WITH INSULIN 300 each 3  . Cholecalciferol (VITAMIN D3) 2000 units TABS Take 4,000 Units by mouth daily.     Marland Kitchen docusate sodium (COLACE) 100 MG capsule Take 100 mg by mouth at bedtime.    . fluticasone (FLONASE) 50 MCG/ACT nasal spray Place 2 sprays into both nostrils daily as needed for allergies or rhinitis.    Marland Kitchen glucose blood (PRODIGY NO CODING BLOOD GLUC) test strip CHECK SUGARS 3 TIMES A DAY.[id number] 300 each 3  . hydrALAZINE (APRESOLINE) 25  MG tablet TAKE 3 TABLETS THREE TIMES DAILY (Patient taking differently: Take 75 mg by mouth 3 (three) times daily. ) 810 tablet 1  . insulin NPH-regular Human (NOVOLIN 70/30) (70-30) 100 UNIT/ML injection Inject 10-50 Units into the skin See admin instructions. Inject 50 units into the skin before breakfast and 10-20 units at bedtime    . isosorbide dinitrate (ISORDIL) 10 MG tablet TAKE 1 TABLET THREE TIMES DAILY 270 tablet 1  . Multiple Vitamins-Minerals (OCUVITE EYE HEALTH FORMULA PO) Take 1 tablet by mouth at bedtime.     Marland Kitchen acetaminophen (TYLENOL) 500 MG tablet  Take 1,000 mg by mouth every 6 (six) hours as needed for mild pain or headache.     . NON FORMULARY [employer] Tonic and Homeopathic Remedy: Mix 1 ounce with water and drink one to two times a day (5-6 times a week)    . Polyethyl Glycol-Propyl Glycol (LUBRICANT EYE DROPS) 0.4-0.3 % SOLN Place 1-2 drops into both eyes 3 (three) times daily as needed (for dry eyes.).    Marland Kitchen sodium chloride (OCEAN) 0.65 % SOLN nasal spray Place 1 spray into both nostrils as needed for congestion.     No current facility-administered medications on file prior to visit.     Current Problems (verified) Patient Active Problem List   Diagnosis Date Noted  . ESRD (end stage renal disease) ([place])   . Generalized weakness   . Mitral valve mass   . Coronary artery disease involving coronary bypass graft of native heart without angina pectoris   . Bradycardia   . Anemia 05/03/2017  . 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 Diabetes mellitus type 2, insulin dependent ([place]) 05/08/2015  . BMI 29.0-29.9,adult 02/14/2015  . Medication management 08/04/2014  . Type II diabetes mellitus with neurological manifestations, uncontrolled ([name]) 02/17/2014  . Gouty arthritis   . Vitamin D deficiency   . Malignant neoplasm of upper-inner quadrant of left breast in female, estrogen receptor positive ([place]) 02/14/2013  . Peripheral vascular disease ([place]) 09/05/2009  . Hyperlipidemia 06/20/2009  . Essential hypertension 06/20/2009  . Congestive heart failure ([place]) 06/20/2009  . EMPHYSEMA 06/20/2009  . Asthma 06/20/2009    Screening Tests Immunization History  Administered Date(s) Administered  . Influenza, High Dose Seasonal PF 01/30/2014, 02/14/2015, 12/17/2015, 01/22/2017, 01/07/2018  . Influenza-Unspecified 02/05/2013  . Pneumococcal Conjugate-13 01/30/2014  . Pneumococcal Polysaccharide-23 01/07/2011  . Td 01/13/2012  . Zoster 01/13/2012     Preventative care: Last colonoscopy: 2011 Last mammogram: 11/2017 DEXA:12/2016 - normal Echo 2019  Prior vaccinations: TD or Tdap: 2013  Influenza: 2019 Pneumococcal: 2012 Prevnar13: 2015 Shingles/Zostavax: 2013  Names of Other Physician/Practitioners you currently use: 1. [facility]- here for primary care 2. Dr. Bing Plume, eye doctor, last visit 2017 - NEEDS TO SCHEDULE need eye report  3. Dr. Satira Sark, Dr. Mallie Snooks , dentist, last visit feb 2018  Patient Care Team: Unk Pinto, MD as PCP - General (Internal Medicine) Josue Hector, MD as PCP - Cardiology (Cardiology) Magrinat, Virgie Dad, MD as Consulting Physician (Oncology) Kyung Rudd, MD as Consulting Physician (Radiation Oncology) Newt Minion, MD as Consulting Physician (Orthopedic Surgery) Philemon Kingdom, MD as Consulting Physician (Internal Medicine) Josue Hector, MD as Consulting Physician (Cardiology) Inda Castle, MD (Inactive) as Consulting Physician (Gastroenterology) Rolm Bookbinder, MD as Consulting Physician (General Surgery) Calvert Cantor, MD as Consulting Physician (Ophthalmology) Madelon Lips, MD as Consulting Physician (Nephrology) Allergies Allergies  Allergen Reactions  . Atenolol Other (See Comments)    Exacerbates gout Exacerbates gout  . Adhesive [Tape] Other (See Comments)    SKIN IS VERY SENSITIVE AND BRUISES AND TEARS EASILY; PLEASE USE AN ALTERNATIVE TO TAPE!!  . Contrast Media [Iodinated Diagnostic Agents] Rash  . Latex Rash  . 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 FAMILY HISTORY Her family history includes Breast cancer in her maternal aunt; Breast cancer (age of onset: 85) in her sister; Breast cancer (age of onset: 21) in her sister; Hypertension in her brother; Kidney cancer (age of onset: 34) in her brother; Throat cancer (age of onset: 61) in her father. SOCIAL HISTORY She  reports that she has quit smoking. She has never used smokeless tobacco. She reports current alcohol use. She reports that she does not use drugs.  MEDICARE WELLNESS OBJECTIVES: Physical activity: Current Exercise Habits: The patient does not participate in regular exercise at present, Exercise limited by: Other - see comments(dialysis, ESRD) Cardiac risk factors: Cardiac Risk Factors include: advanced age (>19men, >90 women);hypertension;dyslipidemia;diabetes mellitus;sedentary lifestyle;microalbuminuria Depression/mood screen:   Depression screen [facility] 2/9 03/22/2018  Decreased Interest 0  Down, Depressed, Hopeless 0  PHQ - 2 Score 0  Some recent data might be hidden    ADLs:  In your present state of health, do you have any difficulty performing the following activities: 03/22/2018 12/17/2017  Hearing? N N  Vision? N N  Difficulty concentrating or making decisions? N N  Walking or climbing stairs? N N  Dressing or bathing? N N  Doing errands, shopping? N N  Some recent data might be hidden     Cognitive Testing  Alert? Yes  Normal Appearance?Yes  Oriented to person? Yes  Place? Yes   Time? Yes  Recall of three objects?  Yes  Can perform simple calculations? Yes  Displays appropriate judgment?Yes  Can read  the correct time from a watch face?Yes  EOL planning: Does Patient Have a Medical Advance Directive?: Yes Type of Advance Directive: Healthcare Power of Attorney, Living will Does patient want to make changes to medical advance directive?: No - Patient declined Copy of [place] in Chart?: No - copy requested   Objective:   Today's Vitals   03/22/18 1455 03/22/18 1540  BP: (!) 176/80 (!) 148/84  Pulse: 92   Temp: 97.7 F (36.5 C)   SpO2: 96%   Weight: 164 lb (74.4 kg)   Height: 5\' 5"  (1.651 m)    Body mass index is 27.29 kg/m.  General appearance: alert, no distress, WD/WN,  female HEENT: normocephalic, sclerae anicteric, TMs pearly, nares patent, no discharge or erythema, pharynx normal Oral cavity: MMM, no lesions Neck: supple, no lymphadenopathy, no thyromegaly, no masses Heart: RRR, normal S1, S2, no murmurs Lungs: CTA bilaterally, no wheezes, rhonchi, or rales Abdomen: +bs, soft, non tender, non distended, no masses, no hepatomegaly, no splenomegaly Musculoskeletal: nontender, no swelling, no obvious deformity Extremities:  no edema, no cyanosis, no clubbing; fistula to RUE intact with thrill, catheter to chest with clean intact dressing Pulses: 2+ symmetric, upper and lower extremities, normal cap refill Neurological: alert, oriented x 3, CN2-12 intact, strength normal upper extremities and lower extremities, sensation normal throughout, DTRs 2+ throughout, no cerebellar signs, gait normal Psychiatric: normal affect, behavior normal, pleasant  Breast: defer Gyn: defer Rectal: defer   Medicare Attestation I have personally reviewed: The patient's medical and social history Their use of alcohol, tobacco or illicit drugs Their current medications and supplements The patient's functional ability including ADLs,fall risks, home safety risks, cognitive, and hearing and visual impairment Diet and physical activities Evidence for depression or mood  disorders  The patient's weight, height, BMI, and visual acuity have been recorded in the chart.  I have made referrals, counseling, and provided education to the patient based on review of the above and I have provided the patient with a written personalized care plan for preventive services.     [name], NP   03/22/2018

## 2018-03-20 DIAGNOSIS — E876 Hypokalemia: Secondary | ICD-10-CM | POA: Diagnosis not present

## 2018-03-20 DIAGNOSIS — N186 End stage renal disease: Secondary | ICD-10-CM | POA: Diagnosis not present

## 2018-03-20 DIAGNOSIS — N2581 Secondary hyperparathyroidism of renal origin: Secondary | ICD-10-CM | POA: Diagnosis not present

## 2018-03-20 DIAGNOSIS — D631 Anemia in chronic kidney disease: Secondary | ICD-10-CM | POA: Diagnosis not present

## 2018-03-22 ENCOUNTER — Ambulatory Visit (INDEPENDENT_AMBULATORY_CARE_PROVIDER_SITE_OTHER): Payer: Medicare Other | Admitting: Adult Health

## 2018-03-22 ENCOUNTER — Encounter: Payer: Self-pay | Admitting: Adult Health

## 2018-03-22 VITALS — BP 148/84 | HR 92 | Temp 97.7°F | Ht 65.0 in | Wt 164.0 lb

## 2018-03-22 DIAGNOSIS — J45902 Unspecified asthma with status asthmaticus: Secondary | ICD-10-CM | POA: Diagnosis not present

## 2018-03-22 DIAGNOSIS — E782 Mixed hyperlipidemia: Secondary | ICD-10-CM | POA: Diagnosis not present

## 2018-03-22 DIAGNOSIS — J438 Other emphysema: Secondary | ICD-10-CM

## 2018-03-22 DIAGNOSIS — I25810 Atherosclerosis of coronary artery bypass graft(s) without angina pectoris: Secondary | ICD-10-CM

## 2018-03-22 DIAGNOSIS — E1149 Type 2 diabetes mellitus with other diabetic neurological complication: Secondary | ICD-10-CM

## 2018-03-22 DIAGNOSIS — Z79899 Other long term (current) drug therapy: Secondary | ICD-10-CM | POA: Diagnosis not present

## 2018-03-22 DIAGNOSIS — N185 Chronic kidney disease, stage 5: Secondary | ICD-10-CM

## 2018-03-22 DIAGNOSIS — Z794 Long term (current) use of insulin: Secondary | ICD-10-CM | POA: Diagnosis not present

## 2018-03-22 DIAGNOSIS — I058 Other rheumatic mitral valve diseases: Secondary | ICD-10-CM

## 2018-03-22 DIAGNOSIS — Z0001 Encounter for general adult medical examination with abnormal findings: Secondary | ICD-10-CM

## 2018-03-22 DIAGNOSIS — M109 Gout, unspecified: Secondary | ICD-10-CM | POA: Diagnosis not present

## 2018-03-22 DIAGNOSIS — I739 Peripheral vascular disease, unspecified: Secondary | ICD-10-CM

## 2018-03-22 DIAGNOSIS — C50212 Malignant neoplasm of upper-inner quadrant of left female breast: Secondary | ICD-10-CM | POA: Diagnosis not present

## 2018-03-22 DIAGNOSIS — R6889 Other general symptoms and signs: Secondary | ICD-10-CM | POA: Diagnosis not present

## 2018-03-22 DIAGNOSIS — I10 Essential (primary) hypertension: Secondary | ICD-10-CM

## 2018-03-22 DIAGNOSIS — I5042 Chronic combined systolic (congestive) and diastolic (congestive) heart failure: Secondary | ICD-10-CM | POA: Diagnosis not present

## 2018-03-22 DIAGNOSIS — Z6829 Body mass index (BMI) 29.0-29.9, adult: Secondary | ICD-10-CM

## 2018-03-22 DIAGNOSIS — Z170 Estrogen receptor positive status [ER+]: Secondary | ICD-10-CM

## 2018-03-22 DIAGNOSIS — Z0000 Encounter for general adult medical examination without abnormal findings: Secondary | ICD-10-CM

## 2018-03-22 DIAGNOSIS — E559 Vitamin D deficiency, unspecified: Secondary | ICD-10-CM | POA: Diagnosis not present

## 2018-03-22 DIAGNOSIS — N186 End stage renal disease: Secondary | ICD-10-CM

## 2018-03-22 DIAGNOSIS — E119 Type 2 diabetes mellitus without complications: Secondary | ICD-10-CM | POA: Diagnosis not present

## 2018-03-22 DIAGNOSIS — R001 Bradycardia, unspecified: Secondary | ICD-10-CM

## 2018-03-22 DIAGNOSIS — IMO0002 Reserved for concepts with insufficient information to code with codable children: Secondary | ICD-10-CM

## 2018-03-22 DIAGNOSIS — D649 Anemia, unspecified: Secondary | ICD-10-CM

## 2018-03-22 DIAGNOSIS — E1165 Type 2 diabetes mellitus with hyperglycemia: Secondary | ICD-10-CM

## 2018-03-22 NOTE — Patient Instructions (Addendum)
  Ms. Collar , Thank you for taking time to come for your Medicare Wellness Visit. I appreciate your ongoing commitment to your health goals. Please review the following plan we discussed and let me know if I can assist you in the future.   This is a list of the screening recommended for you and due dates:  Health Maintenance  Topic Date Due  . Eye exam for diabetics  10/18/2016  . Flu Shot  11/05/2017  .  Hepatitis C: One time screening is recommended by Center for Disease Control  (CDC) for  adults born from [birth year] through 1965.   04/07/2018*  . Hemoglobin A1C  05/29/2018  . Complete foot exam   08/05/2018  . Mammogram  01/21/2019  . Colon Cancer Screening  03/08/2020  . Tetanus Vaccine  01/12/2022  . DEXA scan (bone density measurement)  Completed  . Pneumonia vaccines  Completed  *Topic was postponed. The date shown is not the original due date.    Please schedule diabetic eye exam    Know what a healthy weight is for you (roughly BMI <25) and aim to maintain this  Aim for 7+ servings of fruits and vegetables daily  65-80+ fluid ounces of water or unsweet tea for healthy kidneys  Limit to max 1 drink of alcohol per day; avoid smoking/tobacco  Limit animal fats in diet for cholesterol and heart health - choose grass fed whenever available  Avoid highly processed foods, and foods high in saturated/trans fats  Aim for low stress - take time to unwind and care for your mental health  Aim for 150 min of moderate intensity exercise weekly for heart health, and weights twice weekly for bone health  Aim for 7-9 hours of sleep daily

## 2018-03-23 DIAGNOSIS — E876 Hypokalemia: Secondary | ICD-10-CM | POA: Diagnosis not present

## 2018-03-23 DIAGNOSIS — N2581 Secondary hyperparathyroidism of renal origin: Secondary | ICD-10-CM | POA: Diagnosis not present

## 2018-03-23 DIAGNOSIS — N186 End stage renal disease: Secondary | ICD-10-CM | POA: Diagnosis not present

## 2018-03-23 DIAGNOSIS — D631 Anemia in chronic kidney disease: Secondary | ICD-10-CM | POA: Diagnosis not present

## 2018-03-23 LAB — MAGNESIUM: Magnesium: 2.3 mg/dL (ref 1.5–2.5)

## 2018-03-23 LAB — LIPID PANEL
CHOL/HDL RATIO: 3.1 (calc) (ref <5.0)
Cholesterol: 136 mg/dL (ref <200)
HDL: 44 mg/dL — ABNORMAL LOW (ref >50)
LDL Cholesterol (Calc): 75 mg/dL (calc)
Non-HDL Cholesterol (Calc): 92 mg/dL (calc) (ref <130)
Triglycerides: 91 mg/dL (ref <150)

## 2018-03-23 LAB — HEMOGLOBIN A1C
HEMOGLOBIN A1C: 5.4 %{Hb} (ref <5.7)
MEAN PLASMA GLUCOSE: 108 (calc)
eAG (mmol/L): 6 (calc)

## 2018-03-23 LAB — TSH: TSH: 1.24 m[IU]/L (ref 0.40–4.50)

## 2018-03-23 LAB — VITAMIN D 25 HYDROXY (VIT D DEFICIENCY, FRACTURES): VIT D 25 HYDROXY: 66 ng/mL (ref 30–100)

## 2018-03-25 DIAGNOSIS — E876 Hypokalemia: Secondary | ICD-10-CM | POA: Diagnosis not present

## 2018-03-25 DIAGNOSIS — E1129 Type 2 diabetes mellitus with other diabetic kidney complication: Secondary | ICD-10-CM | POA: Diagnosis not present

## 2018-03-25 DIAGNOSIS — N186 End stage renal disease: Secondary | ICD-10-CM | POA: Diagnosis not present

## 2018-03-25 DIAGNOSIS — D631 Anemia in chronic kidney disease: Secondary | ICD-10-CM | POA: Diagnosis not present

## 2018-03-25 DIAGNOSIS — N2581 Secondary hyperparathyroidism of renal origin: Secondary | ICD-10-CM | POA: Diagnosis not present

## 2018-03-27 DIAGNOSIS — N186 End stage renal disease: Secondary | ICD-10-CM | POA: Diagnosis not present

## 2018-03-27 DIAGNOSIS — N2581 Secondary hyperparathyroidism of renal origin: Secondary | ICD-10-CM | POA: Diagnosis not present

## 2018-03-27 DIAGNOSIS — E876 Hypokalemia: Secondary | ICD-10-CM | POA: Diagnosis not present

## 2018-03-27 DIAGNOSIS — D631 Anemia in chronic kidney disease: Secondary | ICD-10-CM | POA: Diagnosis not present

## 2018-03-29 DIAGNOSIS — N2581 Secondary hyperparathyroidism of renal origin: Secondary | ICD-10-CM | POA: Diagnosis not present

## 2018-03-29 DIAGNOSIS — E876 Hypokalemia: Secondary | ICD-10-CM | POA: Diagnosis not present

## 2018-03-29 DIAGNOSIS — N186 End stage renal disease: Secondary | ICD-10-CM | POA: Diagnosis not present

## 2018-03-29 DIAGNOSIS — D631 Anemia in chronic kidney disease: Secondary | ICD-10-CM | POA: Diagnosis not present

## 2018-04-01 DIAGNOSIS — N186 End stage renal disease: Secondary | ICD-10-CM | POA: Diagnosis not present

## 2018-04-01 DIAGNOSIS — D631 Anemia in chronic kidney disease: Secondary | ICD-10-CM | POA: Diagnosis not present

## 2018-04-01 DIAGNOSIS — E876 Hypokalemia: Secondary | ICD-10-CM | POA: Diagnosis not present

## 2018-04-01 DIAGNOSIS — N2581 Secondary hyperparathyroidism of renal origin: Secondary | ICD-10-CM | POA: Diagnosis not present

## 2018-04-02 NOTE — Progress Notes (Deleted)
CARDIOLOGY OFFICE NOTE  Date:  04/02/2018    [name] Date of Birth: [birth year] Medical Record #[id number]  PCP:  Unk Pinto, MD  Cardiologist:  Servando Snare & formerly Johnsie Cancel   No chief complaint on file.   History of Present Illness: 72 y.o. f/u CAD CABG 2011 with Dr Cyndia Bent Also has PVD worse in LE and needs left fem-pop. Stage 4 kidney disease Sees [facility];s include HTN, HLD, DM.  Chronic LBBB. 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 Saw Dr Early for evaluation of her right radiocephalic fistula on 67/6/72 for infiltrations   Using Right IJ for dialysis Cr up into the 3.2 range now   ***     Past Medical History:  Diagnosis Date  . Allergy   . Arthritis    "maybe in my lower back" (08/27/2012)  . 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 Chronic combined systolic and diastolic CHF (congestive heart failure) ([place])   . CKD (chronic kidney disease), stage IV ([place])   . Coronary artery disease    a. NSTEMI in 05/2009 s/p CABG (LIMA-LAD, SVG-diagonal, SVG-OM1/OM 2).   Marland Kitchen Dyspnea    with exertion  . Family history of anesthesia complication    Mom has a hard time to wake up  . Foot ulcer ([place])    "I've had them on both feet" (08/27/2012)  . Gouty arthritis    "right index finger" (08/27/2012)  . History of radiation therapy 06/09/13-07/06/13   left breast 50Gy  . Hyperlipidemia   . Hypertension   . Infection    right second toe  . Ischemic cardiomyopathy    a. 2011: EF 40-45%. b. EF 55-60% in 04/2017 but shortly after as inpatient was 45-50%.  Marland Kitchen LBBB (left bundle branch block)   . Mitral regurgitation   . Neuropathy    Hx; of B/L feet  . NSTEMI (non-ST elevated myocardial infarction) ([place]) 05/23/2009  . Osteomyelitis of foot ([place])   . PAD (peripheral artery disease) ([facility])    a. LE PAD (patient previously elected  hold off L fem-pop), prev followed by Dr. Bridgett Larsson  . PONV (postoperative nausea and vomiting)   . Sinus headache    "occasionally" (08/27/2012)  . Type II diabetes mellitus ([place])   . Vitamin D deficiency     Past Surgical History:  Procedure Laterality Date  . AMPUTATION Right 08/27/2012   Procedure: AMPUTATION RAY;  Surgeon: Newt Minion, MD;  Location: [facility];  Service: Orthopedics;  Laterality: Right;  Right Foot 5th Ray Amputation  . AMPUTATION Left 07/08/2013   Procedure: AMPUTATION RAY;  Surgeon: Newt Minion, MD;  Location: [facility];  Service: Orthopedics;  Laterality: Left;  Left Foot 2nd Ray Amputation  . AMPUTATION RAY Right 08/27/2012   5th ray/notes 08/27/2012  . AMPUTATION TOE Right 03/06/2017   Procedure: AMPUTATION TOE, INTERPHANGEAL 2ND RIGHT;  Surgeon: [name], DPM;  Location: [facility];  Service: Podiatry;  Laterality: Right;  . AV FISTULA PLACEMENT Right 11/11/2017   Procedure: RIGHT RADIOCEPHALIC  ARTERIOVENOUS FISTULA;  Surgeon: [name], MD;  Location: [facility];  Service: Vascular;  Laterality: Right;  . BREAST LUMPECTOMY  04/20/2013   with biopsy      DR WAKEFIELD  . BREAST LUMPECTOMY WITH NEEDLE LOCALIZATION AND AXILLARY SENTINEL LYMPH NODE BX Left 04/20/2013   Procedure: LEFT BREAST WIRE  GUIDED LUMPECTOMY AND AXILLARY SENTINEL LYMPH NODE BX;  Surgeon: Rolm Bookbinder, MD;  Location: [facility];  Service: General;  Laterality: Left;  . CARDIAC CATHETERIZATION  05/24/2009   Archie Endo 05/24/2009 (08/27/2012)  . CATARACT EXTRACTION W/ INTRAOCULAR LENS  IMPLANT, BILATERAL  2000's  . COLONOSCOPY W/ BIOPSIES AND POLYPECTOMY  2010  . CORONARY ARTERY BYPASS GRAFT  2011   "CABG X4" (08/27/2012)  . DENTAL SURGERY  04/30/11   "1 implant" (08/27/2012)  . DILATION AND CURETTAGE OF UTERUS    . EYE SURGERY    . FINGER SURGERY Right    "index finger; turned out to be gout" (08/27/2012)  . IR FLUORO GUIDE CV LINE RIGHT  11/30/2017  . TEE WITHOUT CARDIOVERSION N/A 12/01/2017   Procedure:  TRANSESOPHAGEAL ECHOCARDIOGRAM (TEE);  Surgeon: Lelon Perla, MD;  Location: [facility] ENDOSCOPY;  Service: Cardiovascular;  Laterality: N/A;     Medications: No outpatient medications have been marked as taking for the 04/05/18 encounter (Appointment) with Josue [facility], MD.     Allergies: Allergies  Allergen Reactions  . Atenolol Other (See Comments)    Exacerbates gout Exacerbates gout  . Adhesive [Tape] Other (See Comments)    SKIN IS VERY SENSITIVE AND BRUISES AND TEARS EASILY; PLEASE USE AN ALTERNATIVE TO TAPE!!  . Contrast Media [Iodinated Diagnostic Agents] Rash  . Latex Rash  . Omnipaque [Iohexol] Rash    Social History: The patient  reports that she has quit smoking. She has never used smokeless tobacco. She reports current alcohol use. She reports that she does not use drugs.   Family History: The patient's family history includes Breast cancer in her maternal aunt; Breast cancer (age of onset: 72) in her sister; Breast cancer (age of onset: 70) in her sister; Hypertension in her brother; Kidney cancer (age of onset: 39) in her brother; Throat cancer (age of onset: 33) in her father.   Review of Systems: Please see the history of present illness.   Otherwise, the review of systems is positive for none.   All other systems are reviewed and negative.   Physical Exam: VS:  There were no vitals taken for this visit. Marland Kitchen  BMI There is no height or weight on file to calculate BMI.  Wt Readings from Last 3 Encounters:  03/22/18 164 lb (74.4 kg)  03/15/18 165 lb 12.8 oz (75.2 kg)  02/10/18 164 lb 8 oz (74.6 kg)   Affect appropriate Chronically ill white female  HEENT: right IJ dialysis catheter  Neck supple with no adenopathy JVP normal no bruits no thyromegaly Lungs clear with no wheezing and good diaphragmatic motion Heart:  S1/S2 no murmur, no rub, gallop or click PMI normal Abdomen: benighn, BS positve, no tenderness, no AAA no bruit.  No HSM or HJR Decreased  peripheral pulses worse on left  Amputation right foot 4 th/ 5 th digits  Neuro non-focal Skin warm and dry No muscular weakness    LABORATORY DATA:  EKG:   11/29/17 SR LBBB chronic no acute changes   Lab Results  Component Value Date   WBC 8.9 02/10/2018   HGB 11.2 (L) 02/10/2018   HCT 35.8 (L) 02/10/2018   PLT 258 02/10/2018   GLUCOSE 52 (L) 02/10/2018   CHOL 136 03/22/2018   TRIG 91 03/22/2018   HDL 44 (L) 03/22/2018   LDLCALC 75 03/22/2018   ALT 18 02/10/2018   AST 22 02/10/2018   NA 140 02/10/2018   K 3.5 02/10/2018   CL 97 (L)  02/10/2018   CREATININE 2.73 (H) 02/10/2018   BUN 45 (H) 02/10/2018   CO2 32 02/10/2018   TSH 1.24 03/22/2018   INR 1.17 11/30/2017   HGBA1C 5.4 03/22/2018   MICROALBUR 118.3 08/04/2017     BNP (last 3 results) Recent Labs    05/03/17 1951 11/03/17 0828 11/26/17 1332  BNP 784.6* 1,413.2* 2,382.1*    ProBNP (last 3 results) No results for input(s): PROBNP in the last 8760 hours.   Other Studies Reviewed Today:  Myoview Study Highlights 05/2017    Nuclear stress EF: 44%.  There was no ST segment deviation noted during stress.  The study is normal.  This is a low risk study.  The left ventricular ejection fraction is mildly decreased (45-54%).   Normal pharmacologic nuclear stress test with no evidence for prior infarct or ischemia.  Mildly decreased LVEF calculated at 44%.      2D Echo 05/04/17 Study Conclusions  - Left ventricle: The cavity size was normal. Wall thickness was increased in a pattern of mild LVH. Systolic function was mildly reduced. The estimated ejection fraction was in the range of 45% to 50%. Diffuse hypokinesis. Features are consistent with a pseudonormal left ventricular filling pattern, with concomitant abnormal relaxation and increased filling pressure (grade 2 diastolic dysfunction). - Aortic valve: Poorly visualized. Probably trileaflet; mildly calcified leaflets.  There was no stenosis. - Mitral valve: Severely calcified annulus. Moderately calcified leaflets . The findings are consistent with trivial stenosis. There was moderate regurgitation. Pressure half-time: 77 ms. Mean gradient (D): 6 mm Hg. - Left atrium: The atrium was mildly dilated. - Right ventricle: The cavity size was normal. Systolic function was normal. - Pulmonary arteries: PA peak pressure: 32 mm Hg (S). - Inferior vena cava: The vessel was normal in size. The respirophasic diameter changes were in the normal range (>= 50%), consistent with normal central venous pressure.  Impressions:  - Normal LV size with mild LV hypertrophy. EF 45-50%, diffuse hypokinesis. Normal RV size and systolic function. Heavily calcified mitral valve and annulus. There was moderate mitral regurgitation and probably only trivial mitral stenosis (mean gradient 6 mmHg but PHT 77 msec suggesting valve area in normal range).    Assessment/Plan:  1.Chronic combined systolic and diastolic CHF - EF 09-32% TEE August 2019 improved with dialysis   2. CAD with remote MI/CABG - multiple risk factors - her Myovew from2/14/19  was felt to be low risk.   3. HLD - on high dose statin   4. Moderate MR on TEE August 2019 follow   5. HTN - better control noted at home. For now, no change in her regimen.   6. CKD - f/u Hollie Salk and Early regarding access Cr baseline now 3.2   Jenkins Rouge

## 2018-04-03 DIAGNOSIS — D631 Anemia in chronic kidney disease: Secondary | ICD-10-CM | POA: Diagnosis not present

## 2018-04-03 DIAGNOSIS — N2581 Secondary hyperparathyroidism of renal origin: Secondary | ICD-10-CM | POA: Diagnosis not present

## 2018-04-03 DIAGNOSIS — E876 Hypokalemia: Secondary | ICD-10-CM | POA: Diagnosis not present

## 2018-04-03 DIAGNOSIS — N186 End stage renal disease: Secondary | ICD-10-CM | POA: Diagnosis not present

## 2018-04-05 ENCOUNTER — Ambulatory Visit: Payer: Medicare Other | Admitting: Cardiovascular Disease

## 2018-04-05 DIAGNOSIS — D631 Anemia in chronic kidney disease: Secondary | ICD-10-CM | POA: Diagnosis not present

## 2018-04-05 DIAGNOSIS — N2581 Secondary hyperparathyroidism of renal origin: Secondary | ICD-10-CM | POA: Diagnosis not present

## 2018-04-05 DIAGNOSIS — N186 End stage renal disease: Secondary | ICD-10-CM | POA: Diagnosis not present

## 2018-04-05 DIAGNOSIS — E876 Hypokalemia: Secondary | ICD-10-CM | POA: Diagnosis not present

## 2018-04-06 NOTE — Progress Notes (Signed)
CARDIOLOGY OFFICE NOTE  Date:  04/12/2018    [name] Date of Birth: 1945-08-08 Medical Record #[id number]  PCP:  Unk Pinto, MD  Cardiologist:  Servando Snare & formerly Johnsie Cancel   No chief complaint on file.   History of Present Illness: 72 y.o. f/u CAD CABG 2011 with Dr Cyndia Bent Also has PVD worse in LE and needs left fem-pop. Stage 4 kidney disease Sees [facility];s include HTN, HLD, DM.  Chronic LBBB. 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 Saw Dr Early for evaluation of her right radiocephalic fistula on 05/15/23 for infiltrations   Using Right IJ for dialysis Cr up into the 3.2 range now  Getting dialysis 3 x / week but looking into peritoneal dialysis  No angina or cardiac symptoms Feels much better since starting dialysis  Right radial fistula still painful to use    Past Medical History:  Diagnosis Date  . Allergy   . Arthritis    "maybe in my lower back" (08/27/2012)  . 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 Chronic combined systolic and diastolic CHF (congestive heart failure) ([place])   . CKD (chronic kidney disease), stage IV ([place])   . Coronary artery disease    a. NSTEMI in 05/2009 s/p CABG (LIMA-LAD, SVG-diagonal, SVG-OM1/OM 2).   Marland Kitchen Dyspnea    with exertion  . Family history of anesthesia complication    Mom has a hard time to wake up  . Foot ulcer ([place])    "I've had them on both feet" (08/27/2012)  . Gouty arthritis    "right index finger" (08/27/2012)  . History of radiation therapy 06/09/13-07/06/13   left breast 50Gy  . Hyperlipidemia   . Hypertension   . Infection    right second toe  . Ischemic cardiomyopathy    a. 2011: EF 40-45%. b. EF 55-60% in 04/2017 but shortly after as inpatient was 45-50%.  Marland Kitchen LBBB (left bundle branch block)   . Mitral regurgitation   . Neuropathy    Hx; of B/L feet  . NSTEMI  (non-ST elevated myocardial infarction) ([place]) 05/23/2009  . Osteomyelitis of foot ([place])   . PAD (peripheral artery disease) (HCC)    a. LE PAD (patient previously elected hold off L fem-pop), prev followed by Dr. Bridgett Larsson  . PONV (postoperative nausea and vomiting)   . Sinus headache    "occasionally" (08/27/2012)  . Type II diabetes mellitus ([place])   . Vitamin D deficiency     Past Surgical History:  Procedure Laterality Date  . AMPUTATION Right 08/27/2012   Procedure: AMPUTATION RAY;  Surgeon: Newt Minion, MD;  Location: [facility];  Service: Orthopedics;  Laterality: Right;  Right Foot 5th Ray Amputation  . AMPUTATION Left 07/08/2013   Procedure: AMPUTATION RAY;  Surgeon: Newt Minion, MD;  Location: [facility];  Service: Orthopedics;  Laterality: Left;  Left Foot 2nd Ray Amputation  . AMPUTATION RAY Right 08/27/2012   5th ray/notes 08/27/2012  . AMPUTATION TOE Right 03/06/2017   Procedure: AMPUTATION TOE, INTERPHANGEAL 2ND RIGHT;  Surgeon: [name], DPM;  Location: [facility];  Service: Podiatry;  Laterality: Right;  . AV FISTULA PLACEMENT Right 11/11/2017   Procedure: RIGHT RADIOCEPHALIC  ARTERIOVENOUS FISTULA;  Surgeon: [name], MD;  Location: [facility];  Service: Vascular;  Laterality: Right;  . BREAST LUMPECTOMY  04/20/2013   with biopsy  DR WAKEFIELD  . BREAST LUMPECTOMY WITH NEEDLE LOCALIZATION AND AXILLARY SENTINEL LYMPH NODE BX Left 04/20/2013   Procedure: LEFT BREAST WIRE GUIDED LUMPECTOMY AND AXILLARY SENTINEL LYMPH NODE BX;  Surgeon: Rolm Bookbinder, MD;  Location: [facility];  Service: General;  Laterality: Left;  . CARDIAC CATHETERIZATION  05/24/2009   Archie Endo 05/24/2009 (08/27/2012)  . CATARACT EXTRACTION W/ INTRAOCULAR LENS  IMPLANT, BILATERAL  2000's  . COLONOSCOPY W/ BIOPSIES AND POLYPECTOMY  2010  . CORONARY ARTERY BYPASS GRAFT  2011   "CABG X4" (08/27/2012)  . DENTAL SURGERY  04/30/11   "1 implant" (08/27/2012)  . DILATION AND CURETTAGE OF UTERUS    . EYE SURGERY    . FINGER  SURGERY Right    "index finger; turned out to be gout" (08/27/2012)  . IR FLUORO GUIDE CV LINE RIGHT  11/30/2017  . TEE WITHOUT CARDIOVERSION N/A 12/01/2017   Procedure: TRANSESOPHAGEAL ECHOCARDIOGRAM (TEE);  Surgeon: Lelon Perla, MD;  Location: [facility] ENDOSCOPY;  Service: Cardiovascular;  Laterality: N/A;     Medications: Current Meds  Medication Sig  . acetaminophen (TYLENOL) 500 MG tablet Take 1,000 mg by mouth every 6 (six) hours as needed for mild pain or headache.   . allopurinol (ZYLOPRIM) 100 MG tablet TAKE 1 TABLET TWICE DAILY  . aspirin EC 81 MG tablet Take 1 tablet (81 mg total) by mouth daily.  . Aspirin-Acetaminophen-Caffeine (EXCEDRIN EXTRA STRENGTH PO) Take by mouth as needed.  . BD VEO INSULIN SYRINGE U/F 31G X 15/64" 1 ML MISC USE AS DIRECTED WITH INSULIN  . Cholecalciferol (VITAMIN D3) 2000 units TABS Take 4,000 Units by mouth daily.   Marland Kitchen docusate sodium (COLACE) 100 MG capsule Take 100 mg by mouth at bedtime.  . fluticasone (FLONASE) 50 MCG/ACT nasal spray Place 2 sprays into both nostrils daily as needed for allergies or rhinitis.  Marland Kitchen glucose blood (PRODIGY NO CODING BLOOD GLUC) test strip CHECK SUGARS 3 TIMES A DAY.[id number]  . hydrALAZINE (APRESOLINE) 25 MG tablet TAKE 3 TABLETS THREE TIMES DAILY  . insulin NPH-regular Human (70-30) 100 UNIT/ML injection Inject 10-30 Units into the skin 2 (two) times daily with a meal.  . isosorbide dinitrate (ISORDIL) 10 MG tablet TAKE 1 TABLET THREE TIMES DAILY  . Multiple Vitamins-Minerals (OCUVITE EYE HEALTH FORMULA PO) Take 1 tablet by mouth at bedtime.   Vladimir Faster Glycol-Propyl Glycol (LUBRICANT EYE DROPS) 0.4-0.3 % SOLN Place 1-2 drops into both eyes 3 (three) times daily as needed (for dry eyes.).  Marland Kitchen sodium chloride (OCEAN) 0.65 % SOLN nasal spray Place 1 spray into both nostrils as needed for congestion.     Allergies: Allergies  Allergen Reactions  . Atenolol Other (See Comments)    Exacerbates gout Exacerbates gout    . Adhesive [Tape] Other (See Comments)    SKIN IS VERY SENSITIVE AND BRUISES AND TEARS EASILY; PLEASE USE AN ALTERNATIVE TO TAPE!!  . Contrast Media [Iodinated Diagnostic Agents] Rash  . Latex Rash  . Omnipaque [Iohexol] Rash    Social History: The patient  reports that she has quit smoking. She has never used smokeless tobacco. She reports current alcohol use. She reports that she does not use drugs.   Family History: The patient's family history includes Breast cancer in her maternal aunt; Breast cancer (age of onset: 39) in her sister; Breast cancer (age of onset: 32) in her sister; Hypertension in her brother; Kidney cancer (age of onset: 61) in her brother; Throat cancer (age of onset: 58) in her father.  Review of Systems: Please see the history of present illness.   Otherwise, the review of systems is positive for none.   All other systems are reviewed and negative.   Physical Exam: VS:  BP (!) 142/76   Pulse 93   Ht 5\' 5"  (1.651 m)   Wt 165 lb 9.6 oz (75.1 kg)   SpO2 97%   BMI 27.56 kg/m  .  BMI Body mass index is 27.56 kg/m.  Wt Readings from Last 3 Encounters:  04/12/18 165 lb 9.6 oz (75.1 kg)  03/22/18 164 lb (74.4 kg)  03/15/18 165 lb 12.8 oz (75.2 kg)   Affect appropriate Chronically ill white female  HEENT: right IJ dialysis catheter  Neck supple with no adenopathy JVP normal no bruits no thyromegaly Lungs clear with no wheezing and good diaphragmatic motion Heart:  S1/S2 no murmur, no rub, gallop or click PMI normal Abdomen: benighn, BS positve, no tenderness, no AAA no bruit.  No HSM or HJR Decreased peripheral pulses worse on left  Amputation right foot 4 th/ 5 th digits  Neuro non-focal Skin warm and dry No muscular weakness Large right radial fistula with thrill     LABORATORY DATA:  EKG:   11/29/17 SR LBBB chronic no acute changes   Lab Results  Component Value Date   WBC 8.9 02/10/2018   HGB 11.2 (L) 02/10/2018   HCT 35.8 (L) 02/10/2018    PLT 258 02/10/2018   GLUCOSE 52 (L) 02/10/2018   CHOL 136 03/22/2018   TRIG 91 03/22/2018   HDL 44 (L) 03/22/2018   LDLCALC 75 03/22/2018   ALT 18 02/10/2018   AST 22 02/10/2018   NA 140 02/10/2018   K 3.5 02/10/2018   CL 97 (L) 02/10/2018   CREATININE 2.73 (H) 02/10/2018   BUN 45 (H) 02/10/2018   CO2 32 02/10/2018   TSH 1.24 03/22/2018   INR 1.17 11/30/2017   HGBA1C 5.4 03/22/2018   MICROALBUR 118.3 08/04/2017     BNP (last 3 results) Recent Labs    05/03/17 1951 11/03/17 0828 11/26/17 1332  BNP 784.6* 1,413.2* 2,382.1*    ProBNP (last 3 results) No results for input(s): PROBNP in the last 8760 hours.   Other Studies Reviewed Today:  Myoview Study Highlights 05/2017    Nuclear stress EF: 44%.  There was no ST segment deviation noted during stress.  The study is normal.  This is a low risk study.  The left ventricular ejection fraction is mildly decreased (45-54%).   Normal pharmacologic nuclear stress test with no evidence for prior infarct or ischemia.  Mildly decreased LVEF calculated at 44%.      2D Echo 05/04/17 Study Conclusions  - Left ventricle: The cavity size was normal. Wall thickness was increased in a pattern of mild LVH. Systolic function was mildly reduced. The estimated ejection fraction was in the range of 45% to 50%. Diffuse hypokinesis. Features are consistent with a pseudonormal left ventricular filling pattern, with concomitant abnormal relaxation and increased filling pressure (grade 2 diastolic dysfunction). - Aortic valve: Poorly visualized. Probably trileaflet; mildly calcified leaflets. There was no stenosis. - Mitral valve: Severely calcified annulus. Moderately calcified leaflets . The findings are consistent with trivial stenosis. There was moderate regurgitation. Pressure half-time: 77 ms. Mean gradient (D): 6 mm Hg. - Left atrium: The atrium was mildly dilated. - Right ventricle: The cavity  size was normal. Systolic function was normal. - Pulmonary arteries: PA peak pressure: 32 mm Hg (S). - Inferior vena  cava: The vessel was normal in size. The respirophasic diameter changes were in the normal range (>= 50%), consistent with normal central venous pressure.  Impressions:  - Normal LV size with mild LV hypertrophy. EF 45-50%, diffuse hypokinesis. Normal RV size and systolic function. Heavily calcified mitral valve and annulus. There was moderate mitral regurgitation and probably only trivial mitral stenosis (mean gradient 6 mmHg but PHT 77 msec suggesting valve area in normal range).    Assessment/Plan:  1.Chronic combined systolic and diastolic CHF - EF 63-33% TEE August 2019 improved with dialysis   2. CAD with remote MI/CABG - multiple risk factors - her Myovew from2/14/19  was felt to be low risk.  No angina can consider cath in future now that she is being dialyzed   3. HLD - on high dose statin   4. Moderate MR on TEE August 2019 follow   5. HTN - better control noted at home. For now, no change in her regimen.   6. CKD - f/u Hollie Salk and Early regarding access Cr baseline now 3.2  Fistula has great thrill ]looking into peritoneal dialysis   Jenkins Rouge

## 2018-04-08 DIAGNOSIS — N2581 Secondary hyperparathyroidism of renal origin: Secondary | ICD-10-CM | POA: Diagnosis not present

## 2018-04-08 DIAGNOSIS — D509 Iron deficiency anemia, unspecified: Secondary | ICD-10-CM | POA: Diagnosis not present

## 2018-04-08 DIAGNOSIS — E1129 Type 2 diabetes mellitus with other diabetic kidney complication: Secondary | ICD-10-CM | POA: Diagnosis not present

## 2018-04-08 DIAGNOSIS — E876 Hypokalemia: Secondary | ICD-10-CM | POA: Diagnosis not present

## 2018-04-08 DIAGNOSIS — D631 Anemia in chronic kidney disease: Secondary | ICD-10-CM | POA: Diagnosis not present

## 2018-04-08 DIAGNOSIS — N186 End stage renal disease: Secondary | ICD-10-CM | POA: Diagnosis not present

## 2018-04-10 DIAGNOSIS — E876 Hypokalemia: Secondary | ICD-10-CM | POA: Diagnosis not present

## 2018-04-10 DIAGNOSIS — N186 End stage renal disease: Secondary | ICD-10-CM | POA: Diagnosis not present

## 2018-04-10 DIAGNOSIS — D509 Iron deficiency anemia, unspecified: Secondary | ICD-10-CM | POA: Diagnosis not present

## 2018-04-10 DIAGNOSIS — E1129 Type 2 diabetes mellitus with other diabetic kidney complication: Secondary | ICD-10-CM | POA: Diagnosis not present

## 2018-04-10 DIAGNOSIS — N2581 Secondary hyperparathyroidism of renal origin: Secondary | ICD-10-CM | POA: Diagnosis not present

## 2018-04-10 DIAGNOSIS — D631 Anemia in chronic kidney disease: Secondary | ICD-10-CM | POA: Diagnosis not present

## 2018-04-12 ENCOUNTER — Encounter: Payer: Self-pay | Admitting: Cardiovascular Disease

## 2018-04-12 ENCOUNTER — Ambulatory Visit (INDEPENDENT_AMBULATORY_CARE_PROVIDER_SITE_OTHER): Payer: Medicare Other | Admitting: Cardiovascular Disease

## 2018-04-12 VITALS — BP 142/76 | HR 93 | Ht 65.0 in | Wt 165.6 lb

## 2018-04-12 DIAGNOSIS — I10 Essential (primary) hypertension: Secondary | ICD-10-CM | POA: Diagnosis not present

## 2018-04-12 DIAGNOSIS — I340 Nonrheumatic mitral (valve) insufficiency: Secondary | ICD-10-CM

## 2018-04-12 DIAGNOSIS — I25810 Atherosclerosis of coronary artery bypass graft(s) without angina pectoris: Secondary | ICD-10-CM

## 2018-04-12 DIAGNOSIS — I5022 Chronic systolic (congestive) heart failure: Secondary | ICD-10-CM | POA: Diagnosis not present

## 2018-04-12 DIAGNOSIS — N185 Chronic kidney disease, stage 5: Secondary | ICD-10-CM | POA: Diagnosis not present

## 2018-04-12 NOTE — Patient Instructions (Signed)

## 2018-04-13 ENCOUNTER — Other Ambulatory Visit: Payer: Self-pay | Admitting: Podiatry

## 2018-04-13 ENCOUNTER — Ambulatory Visit (INDEPENDENT_AMBULATORY_CARE_PROVIDER_SITE_OTHER): Payer: Medicare Other

## 2018-04-13 ENCOUNTER — Ambulatory Visit (INDEPENDENT_AMBULATORY_CARE_PROVIDER_SITE_OTHER): Payer: Medicare Other | Admitting: Podiatry

## 2018-04-13 ENCOUNTER — Encounter: Payer: Self-pay | Admitting: Podiatry

## 2018-04-13 DIAGNOSIS — E11621 Type 2 diabetes mellitus with foot ulcer: Secondary | ICD-10-CM

## 2018-04-13 DIAGNOSIS — L97421 Non-pressure chronic ulcer of left heel and midfoot limited to breakdown of skin: Secondary | ICD-10-CM | POA: Diagnosis not present

## 2018-04-13 DIAGNOSIS — L97529 Non-pressure chronic ulcer of other part of left foot with unspecified severity: Secondary | ICD-10-CM | POA: Diagnosis not present

## 2018-04-13 DIAGNOSIS — E08621 Diabetes mellitus due to underlying condition with foot ulcer: Secondary | ICD-10-CM

## 2018-04-13 DIAGNOSIS — L97411 Non-pressure chronic ulcer of right heel and midfoot limited to breakdown of skin: Secondary | ICD-10-CM

## 2018-04-13 DIAGNOSIS — L97519 Non-pressure chronic ulcer of other part of right foot with unspecified severity: Principal | ICD-10-CM

## 2018-04-13 MED ORDER — CLINDAMYCIN HCL 150 MG PO CAPS: 150.0000 mg | ORAL_CAPSULE | Freq: Three times a day (TID) | ORAL | 0 refills | Status: AC

## 2018-04-13 NOTE — Patient Instructions (Signed)
Corns and Calluses Corns are small areas of thickened skin that occur on the top, sides, or tip of a toe. They contain a cone-shaped core with a point that can press on a nerve below. This causes pain.  Calluses are areas of thickened skin that can occur anywhere on the body, including the hands, fingers, palms, soles of the feet, and heels. Calluses are usually larger than corns. What are the causes? Corns and calluses are caused by rubbing (friction) or pressure, such as from shoes that are too tight or do not fit properly. What increases the risk? Corns are more likely to develop in people who have misshapen toes (toe deformities), such as hammer toes. Calluses can occur with friction to any area of the skin. They are more likely to develop in people who:  Work with their hands.  Wear shoes that fit poorly, are too tight, or are high-heeled.  Have toe deformities. What are the signs or symptoms? Symptoms of a corn or callus include:  A hard growth on the skin.  Pain or tenderness under the skin.  Redness and swelling.  Increased discomfort while wearing tight-fitting shoes, if your feet are affected. If a corn or callus becomes infected, symptoms may include:  Redness and swelling that gets worse.  Pain.  Fluid, blood, or pus draining from the corn or callus. How is this diagnosed? Corns and calluses may be diagnosed based on your symptoms, your medical history, and a physical exam. How is this treated? Treatment for corns and calluses may include:  Removing the cause of the friction or pressure. This may involve: ? Changing your shoes. ? Wearing shoe inserts (orthotics) or other protective layers in your shoes, such as a corn pad. ? Wearing gloves.  Applying medicine to the skin (topical medicine) to help soften skin in the hardened, thickened areas.  Removing layers of dead skin with a file to reduce the size of the corn or callus.  Removing the corn or callus with a  scalpel or laser.  Taking antibiotic medicines, if your corn or callus is infected.  Having surgery, if a toe deformity is the cause. Follow these instructions at home:   Take over-the-counter and prescription medicines only as told by your health care provider.  If you were prescribed an antibiotic, take it as told by your health care provider. Do not stop taking it even if your condition starts to improve.  Wear shoes that fit well. Avoid wearing high-heeled shoes and shoes that are too tight or too loose.  Wear any padding, protective layers, gloves, or orthotics as told by your health care provider.  Soak your hands or feet and then use a file or pumice stone to soften your corn or callus. Do this as told by your health care provider.  Check your corn or callus every day for symptoms of infection. Contact a health care provider if you:  Notice that your symptoms do not improve with treatment.  Have redness or swelling that gets worse.  Notice that your corn or callus becomes painful.  Have fluid, blood, or pus coming from your corn or callus.  Have new symptoms. Summary  Corns are small areas of thickened skin that occur on the top, sides, or tip of a toe.  Calluses are areas of thickened skin that can occur anywhere on the body, including the hands, fingers, palms, and soles of the feet. Calluses are usually larger than corns.  Corns and calluses are caused by   rubbing (friction) or pressure, such as from shoes that are too tight or do not fit properly.  Treatment may include wearing any padding, protective layers, gloves, or orthotics as told by your health care provider. This information is not intended to replace advice given to you by your health care provider. Make sure you discuss any questions you have with your health care provider. Document Released: 12/29/2003 Document Revised: 02/04/2017 Document Reviewed: 02/04/2017 Elsevier Interactive Patient Education   2019 [employer]  Diabetes Mellitus and [place] care is an important part of your health, especially when you have diabetes. Diabetes may cause you to have problems because of poor blood flow (circulation) to your feet and legs, which can cause your skin to:  Become thinner and drier.  Break more easily.  Heal more slowly.  Peel and crack. You may also have nerve damage (neuropathy) in your legs and feet, causing decreased feeling in them. This means that you may not notice minor injuries to your feet that could lead to more serious problems. Noticing and addressing any potential problems early is the best way to prevent future foot problems. How to care for your feet Foot hygiene  Wash your feet daily with warm water and mild soap. Do not use hot water. Then, pat your feet and the areas between your toes until they are completely dry. Do not soak your feet as this can dry your skin.  Trim your toenails straight across. Do not dig under them or around the cuticle. File the edges of your nails with an emery board or nail file.  Apply a moisturizing lotion or petroleum jelly to the skin on your feet and to dry, brittle toenails. Use lotion that does not contain alcohol and is unscented. Do not apply lotion between your toes. Shoes and socks  Wear clean socks or stockings every day. Make sure they are not too tight. Do not wear knee-high stockings since they may decrease blood flow to your legs.  Wear shoes that fit properly and have enough cushioning. Always look in your shoes before you put them on to be sure there are no objects inside.  To break in new shoes, wear them for just a few hours a day. This prevents injuries on your feet. Wounds, scrapes, corns, and calluses  Check your feet daily for blisters, cuts, bruises, sores, and redness. If you cannot see the bottom of your feet, use a mirror or ask someone for help.  Do not cut corns or calluses or try to remove them with  medicine.  If you find a minor scrape, cut, or break in the skin on your feet, keep it and the skin around it clean and dry. You may clean these areas with mild soap and water. Do not clean the area with peroxide, alcohol, or iodine.  If you have a wound, scrape, corn, or callus on your foot, look at it several times a day to make sure it is healing and not infected. Check for: ? Redness, swelling, or pain. ? Fluid or blood. ? Warmth. ? Pus or a bad smell. General instructions  Do not cross your legs. This may decrease blood flow to your feet.  Do not use heating pads or hot water bottles on your feet. They may burn your skin. If you have lost feeling in your feet or legs, you may not know this is happening until it is too late.  Protect your feet from hot and cold by wearing  shoes, such as at the beach or on hot pavement.  Schedule a complete foot exam at least once a year (annually) or more often if you have foot problems. If you have foot problems, report any cuts, sores, or bruises to your health care provider immediately. Contact a health care provider if:  You have a medical condition that increases your risk of infection and you have any cuts, sores, or bruises on your feet.  You have an injury that is not healing.  You have redness on your legs or feet.  You feel burning or tingling in your legs or feet.  You have pain or cramps in your legs and feet.  Your legs or feet are numb.  Your feet always feel cold.  You have pain around a toenail. Get help right away if:  You have a wound, scrape, corn, or callus on your foot and: ? You have pain, swelling, or redness that gets worse. ? You have fluid or blood coming from the wound, scrape, corn, or callus. ? Your wound, scrape, corn, or callus feels warm to the touch. ? You have pus or a bad smell coming from the wound, scrape, corn, or callus. ? You have a fever. ? You have a red line going up your leg. Summary  Check  your feet every day for cuts, sores, red spots, swelling, and blisters.  Moisturize feet and legs daily.  Wear shoes that fit properly and have enough cushioning.  If you have foot problems, report any cuts, sores, or bruises to your health care provider immediately.  Schedule a complete foot exam at least once a year (annually) or more often if you have foot problems. This information is not intended to replace advice given to you by your health care provider. Make sure you discuss any questions you have with your health care provider. Document Released: 03/21/2000 Document Revised: 05/06/2017 Document Reviewed: 04/25/2016 Elsevier Interactive Patient Education  2019 [employer].

## 2018-04-14 DIAGNOSIS — N186 End stage renal disease: Secondary | ICD-10-CM | POA: Diagnosis not present

## 2018-04-14 DIAGNOSIS — N2581 Secondary hyperparathyroidism of renal origin: Secondary | ICD-10-CM | POA: Diagnosis not present

## 2018-04-14 DIAGNOSIS — E1129 Type 2 diabetes mellitus with other diabetic kidney complication: Secondary | ICD-10-CM | POA: Diagnosis not present

## 2018-04-14 DIAGNOSIS — D631 Anemia in chronic kidney disease: Secondary | ICD-10-CM | POA: Diagnosis not present

## 2018-04-14 DIAGNOSIS — E876 Hypokalemia: Secondary | ICD-10-CM | POA: Diagnosis not present

## 2018-04-14 DIAGNOSIS — D509 Iron deficiency anemia, unspecified: Secondary | ICD-10-CM | POA: Diagnosis not present

## 2018-04-15 DIAGNOSIS — D509 Iron deficiency anemia, unspecified: Secondary | ICD-10-CM | POA: Diagnosis not present

## 2018-04-15 DIAGNOSIS — D631 Anemia in chronic kidney disease: Secondary | ICD-10-CM | POA: Diagnosis not present

## 2018-04-15 DIAGNOSIS — E876 Hypokalemia: Secondary | ICD-10-CM | POA: Diagnosis not present

## 2018-04-15 DIAGNOSIS — N186 End stage renal disease: Secondary | ICD-10-CM | POA: Diagnosis not present

## 2018-04-15 DIAGNOSIS — E1129 Type 2 diabetes mellitus with other diabetic kidney complication: Secondary | ICD-10-CM | POA: Diagnosis not present

## 2018-04-15 DIAGNOSIS — N2581 Secondary hyperparathyroidism of renal origin: Secondary | ICD-10-CM | POA: Diagnosis not present

## 2018-04-17 DIAGNOSIS — N186 End stage renal disease: Secondary | ICD-10-CM | POA: Diagnosis not present

## 2018-04-17 DIAGNOSIS — D509 Iron deficiency anemia, unspecified: Secondary | ICD-10-CM | POA: Diagnosis not present

## 2018-04-17 DIAGNOSIS — E1129 Type 2 diabetes mellitus with other diabetic kidney complication: Secondary | ICD-10-CM | POA: Diagnosis not present

## 2018-04-17 DIAGNOSIS — E876 Hypokalemia: Secondary | ICD-10-CM | POA: Diagnosis not present

## 2018-04-17 DIAGNOSIS — N2581 Secondary hyperparathyroidism of renal origin: Secondary | ICD-10-CM | POA: Diagnosis not present

## 2018-04-17 DIAGNOSIS — D631 Anemia in chronic kidney disease: Secondary | ICD-10-CM | POA: Diagnosis not present

## 2018-04-19 ENCOUNTER — Ambulatory Visit (INDEPENDENT_AMBULATORY_CARE_PROVIDER_SITE_OTHER): Payer: Medicare Other | Admitting: Podiatry

## 2018-04-19 ENCOUNTER — Ambulatory Visit: Payer: Medicare Other | Admitting: Podiatry

## 2018-04-19 DIAGNOSIS — E1149 Type 2 diabetes mellitus with other diabetic neurological complication: Secondary | ICD-10-CM

## 2018-04-19 DIAGNOSIS — E11621 Type 2 diabetes mellitus with foot ulcer: Secondary | ICD-10-CM | POA: Diagnosis not present

## 2018-04-19 DIAGNOSIS — M86672 Other chronic osteomyelitis, left ankle and foot: Secondary | ICD-10-CM

## 2018-04-19 DIAGNOSIS — L97529 Non-pressure chronic ulcer of other part of left foot with unspecified severity: Secondary | ICD-10-CM

## 2018-04-19 DIAGNOSIS — I25810 Atherosclerosis of coronary artery bypass graft(s) without angina pectoris: Secondary | ICD-10-CM | POA: Diagnosis not present

## 2018-04-19 DIAGNOSIS — L97519 Non-pressure chronic ulcer of other part of right foot with unspecified severity: Secondary | ICD-10-CM | POA: Diagnosis not present

## 2018-04-20 DIAGNOSIS — D509 Iron deficiency anemia, unspecified: Secondary | ICD-10-CM | POA: Diagnosis not present

## 2018-04-20 DIAGNOSIS — E876 Hypokalemia: Secondary | ICD-10-CM | POA: Diagnosis not present

## 2018-04-20 DIAGNOSIS — E1129 Type 2 diabetes mellitus with other diabetic kidney complication: Secondary | ICD-10-CM | POA: Diagnosis not present

## 2018-04-20 DIAGNOSIS — N186 End stage renal disease: Secondary | ICD-10-CM | POA: Diagnosis not present

## 2018-04-20 DIAGNOSIS — D631 Anemia in chronic kidney disease: Secondary | ICD-10-CM | POA: Diagnosis not present

## 2018-04-20 DIAGNOSIS — N2581 Secondary hyperparathyroidism of renal origin: Secondary | ICD-10-CM | POA: Diagnosis not present

## 2018-04-22 DIAGNOSIS — E876 Hypokalemia: Secondary | ICD-10-CM | POA: Diagnosis not present

## 2018-04-22 DIAGNOSIS — E1129 Type 2 diabetes mellitus with other diabetic kidney complication: Secondary | ICD-10-CM | POA: Diagnosis not present

## 2018-04-22 DIAGNOSIS — N2581 Secondary hyperparathyroidism of renal origin: Secondary | ICD-10-CM | POA: Diagnosis not present

## 2018-04-22 DIAGNOSIS — D631 Anemia in chronic kidney disease: Secondary | ICD-10-CM | POA: Diagnosis not present

## 2018-04-22 DIAGNOSIS — D509 Iron deficiency anemia, unspecified: Secondary | ICD-10-CM | POA: Diagnosis not present

## 2018-04-22 DIAGNOSIS — N186 End stage renal disease: Secondary | ICD-10-CM | POA: Diagnosis not present

## 2018-04-24 DIAGNOSIS — E876 Hypokalemia: Secondary | ICD-10-CM | POA: Diagnosis not present

## 2018-04-24 DIAGNOSIS — N2581 Secondary hyperparathyroidism of renal origin: Secondary | ICD-10-CM | POA: Diagnosis not present

## 2018-04-24 DIAGNOSIS — D509 Iron deficiency anemia, unspecified: Secondary | ICD-10-CM | POA: Diagnosis not present

## 2018-04-24 DIAGNOSIS — N186 End stage renal disease: Secondary | ICD-10-CM | POA: Diagnosis not present

## 2018-04-24 DIAGNOSIS — E1129 Type 2 diabetes mellitus with other diabetic kidney complication: Secondary | ICD-10-CM | POA: Diagnosis not present

## 2018-04-24 DIAGNOSIS — D631 Anemia in chronic kidney disease: Secondary | ICD-10-CM | POA: Diagnosis not present

## 2018-04-26 ENCOUNTER — Ambulatory Visit (INDEPENDENT_AMBULATORY_CARE_PROVIDER_SITE_OTHER): Payer: Medicare Other | Admitting: Podiatry

## 2018-04-26 DIAGNOSIS — L97519 Non-pressure chronic ulcer of other part of right foot with unspecified severity: Secondary | ICD-10-CM

## 2018-04-26 DIAGNOSIS — I25810 Atherosclerosis of coronary artery bypass graft(s) without angina pectoris: Secondary | ICD-10-CM | POA: Diagnosis not present

## 2018-04-26 DIAGNOSIS — E11621 Type 2 diabetes mellitus with foot ulcer: Secondary | ICD-10-CM

## 2018-04-26 DIAGNOSIS — L97529 Non-pressure chronic ulcer of other part of left foot with unspecified severity: Secondary | ICD-10-CM

## 2018-04-26 NOTE — Progress Notes (Signed)
Subjective: 73 year old female presents the office today for concerns and follow-up evaluation of left fourth toe wound but her main concern was to a callus on the bottom of the right foot with infection that she had.  She was seen by Dr. Elisha Ponder for this.  She was placed on clindamycin.  She says overall she is doing better she denies any increase in swelling redness or any drainage or pus.  Does have last seen her she has started dialysis. Denies any systemic complaints such as fevers, chills, nausea, vomiting. No acute changes since last appointment, and no other complaints at this time.   Objective: AAO x3, NAD DP/PT pulses palpable bilaterally, CRT less than 3 seconds The plantar aspect of the midfoot on the right side is a hyperkeratotic lesion.  Upon debridement there is no underlying ulceration drainage and there is no fluctuation or crepitation malodor. There is a superficial scab present along the lateral aspect left fourth toe.  Is able to debride this and there is no significant ulceration identified at this time there is no edema, erythema, drainage or pus. No open lesions or pre-ulcerative lesions.  No pain with calf compression, swelling, warmth, erythema  Assessment: Fourth toe wound with osteomyelitis, healing right plantar wound  Plan: -All treatment options discussed with the patient including all alternatives, risks, complications.  -I reviewed the x-rays with her from last week.  There is cortical changes in the fourth toe but this is likely chronic issue.  We will continue to monitor very closely we discussed possible amputation by things as a chronic infection.  Continue clindamycin for now and monitor the wound.  Discussed that if she notices any increasing redness or drainage and swelling she needs to go to the emergency room. -I debrided the hyperkeratotic lesion of the arch of the right foot.  Wound appears to be doing better. -Continue with offloading shoes. -Patient  encouraged to call the office with any questions, concerns, change in symptoms.   Return in about 1 week (around 04/26/2018).  [name] DPM

## 2018-04-27 DIAGNOSIS — E876 Hypokalemia: Secondary | ICD-10-CM | POA: Diagnosis not present

## 2018-04-27 DIAGNOSIS — D631 Anemia in chronic kidney disease: Secondary | ICD-10-CM | POA: Diagnosis not present

## 2018-04-27 DIAGNOSIS — N2581 Secondary hyperparathyroidism of renal origin: Secondary | ICD-10-CM | POA: Diagnosis not present

## 2018-04-27 DIAGNOSIS — E1129 Type 2 diabetes mellitus with other diabetic kidney complication: Secondary | ICD-10-CM | POA: Diagnosis not present

## 2018-04-27 DIAGNOSIS — N186 End stage renal disease: Secondary | ICD-10-CM | POA: Diagnosis not present

## 2018-04-27 DIAGNOSIS — D509 Iron deficiency anemia, unspecified: Secondary | ICD-10-CM | POA: Diagnosis not present

## 2018-04-29 DIAGNOSIS — N2581 Secondary hyperparathyroidism of renal origin: Secondary | ICD-10-CM | POA: Diagnosis not present

## 2018-04-29 DIAGNOSIS — E876 Hypokalemia: Secondary | ICD-10-CM | POA: Diagnosis not present

## 2018-04-29 DIAGNOSIS — E1129 Type 2 diabetes mellitus with other diabetic kidney complication: Secondary | ICD-10-CM | POA: Diagnosis not present

## 2018-04-29 DIAGNOSIS — D509 Iron deficiency anemia, unspecified: Secondary | ICD-10-CM | POA: Diagnosis not present

## 2018-04-29 DIAGNOSIS — D631 Anemia in chronic kidney disease: Secondary | ICD-10-CM | POA: Diagnosis not present

## 2018-04-29 DIAGNOSIS — N186 End stage renal disease: Secondary | ICD-10-CM | POA: Diagnosis not present

## 2018-05-01 DIAGNOSIS — E876 Hypokalemia: Secondary | ICD-10-CM | POA: Diagnosis not present

## 2018-05-01 DIAGNOSIS — D631 Anemia in chronic kidney disease: Secondary | ICD-10-CM | POA: Diagnosis not present

## 2018-05-01 DIAGNOSIS — E1129 Type 2 diabetes mellitus with other diabetic kidney complication: Secondary | ICD-10-CM | POA: Diagnosis not present

## 2018-05-01 DIAGNOSIS — N2581 Secondary hyperparathyroidism of renal origin: Secondary | ICD-10-CM | POA: Diagnosis not present

## 2018-05-01 DIAGNOSIS — D509 Iron deficiency anemia, unspecified: Secondary | ICD-10-CM | POA: Diagnosis not present

## 2018-05-01 DIAGNOSIS — N186 End stage renal disease: Secondary | ICD-10-CM | POA: Diagnosis not present

## 2018-05-03 ENCOUNTER — Other Ambulatory Visit: Payer: Self-pay

## 2018-05-03 ENCOUNTER — Ambulatory Visit (INDEPENDENT_AMBULATORY_CARE_PROVIDER_SITE_OTHER): Payer: Medicare Other | Admitting: Podiatry

## 2018-05-03 ENCOUNTER — Encounter: Payer: Self-pay | Admitting: Podiatry

## 2018-05-03 VITALS — Temp 96.8°F

## 2018-05-03 DIAGNOSIS — E11621 Type 2 diabetes mellitus with foot ulcer: Secondary | ICD-10-CM | POA: Diagnosis not present

## 2018-05-03 DIAGNOSIS — M869 Osteomyelitis, unspecified: Secondary | ICD-10-CM | POA: Diagnosis not present

## 2018-05-03 DIAGNOSIS — L97519 Non-pressure chronic ulcer of other part of right foot with unspecified severity: Secondary | ICD-10-CM | POA: Diagnosis not present

## 2018-05-03 DIAGNOSIS — L97529 Non-pressure chronic ulcer of other part of left foot with unspecified severity: Secondary | ICD-10-CM

## 2018-05-03 DIAGNOSIS — I25810 Atherosclerosis of coronary artery bypass graft(s) without angina pectoris: Secondary | ICD-10-CM

## 2018-05-03 NOTE — Progress Notes (Signed)
Subjective: 73 year old female presents the office today for concerns and follow-up evaluation of left fourth toe duration as well as a callus, infection on the plantar aspect the right foot.  Overall she states that she is doing better.  She is continue to change the bandage every other day.  She denies any increase in swelling or redness to her feet she denies any drainage or pus.  She is doing the offloading shoe.  She feels that the wound on the right side is healed in the left foot is almost completely healed not causing any issues.  She has no other concerns today. Denies any systemic complaints such as fevers, chills, nausea, vomiting. No acute changes since last appointment, and no other complaints at this time.   Objective: AAO x3, NAD DP/PT pulses palpable bilaterally, CRT less than 3 seconds The plantar aspect of the midfoot on the right side is a hyperkeratotic lesion.  Upon debridement there is no underlying ulceration, drainage or any signs of infection.  No open lesions identified on the right side. LEFT FOOT: Along the lateral aspect of the fourth toe is a hyperkeratotic lesion, scab.  Upon debridement appears the wound is healed.  There is no edema, erythema, drainage or pus there is no ascending cellulitis, fluctuation or crepitation malodor.  Pre-ulcerative area submetatarsal 1.  Also she is having an ingrown toenail on the medial hallux but there is no signs of infection. 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  -Sharply debrided both of the hyperkeratotic lesion of the left fourth toe on the right plantar foot without any complications or bleeding.  Continue with regular dressing changes on the left foot.  Although it appears to be healed I will check blood work today  including ESR, CRP, CBC.  I think she is more likely chronic osteomyelitis continue to watch closely. -.[email or web address]  Return in about 2 weeks (around 05/17/2018).  [name] DPM

## 2018-05-03 NOTE — Progress Notes (Signed)
Subjective: 73 year old female presents the office today for concerns and follow-up evaluation of left fourth toe duration as well as a callus, infection on the plantar aspect the right foot.  Overall she states that she is getting better.  She does change the bandage every other day on the left foot with Medihoney.  She denies any drainage or pus or any swelling or redness.  She has no pain.  She has no other concerns today. Denies any systemic complaints such as fevers, chills, nausea, vomiting. No acute changes since last appointment, and no other complaints at this time.   Objective: AAO x3, NAD DP/PT pulses palpable bilaterally, CRT less than 3 seconds The plantar aspect of the midfoot on the right side is a hyperkeratotic lesion.  Upon debridement there is no underlying ulceration drainage and there is no fluctuation or crepitation malodor.  This appears to be much improved today. There is a superficial scab present along the lateral aspect left fourth toe.  After I debrided this today there is no drainage pus there is no probing, undermining or tunneling.  No open lesions or pre-ulcerative lesions.  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  -I did debride both the areas today with any complications or bleeding.  Clinically the left fourth toe is stable and does not any clinical signs of infection.  Do think this is more likely chronic osteomyelitis.  We discussed amputation of the toe today as the wound is doing better she was to continue to observe it.  The right foot is doing well at this point.  I want her to continue the moisturizer to the area daily.  She also has a pre-ulcerative area in the left submetatarsal 1 that I want her to continue moisturizer to help prevent any skin  breakdown.  Continue offloading to all the areas daily.  *order ESR, CRP, CBC next appointment  [name] DPM

## 2018-05-04 ENCOUNTER — Encounter: Payer: Self-pay | Admitting: Podiatry

## 2018-05-04 DIAGNOSIS — E1129 Type 2 diabetes mellitus with other diabetic kidney complication: Secondary | ICD-10-CM | POA: Diagnosis not present

## 2018-05-04 DIAGNOSIS — D509 Iron deficiency anemia, unspecified: Secondary | ICD-10-CM | POA: Diagnosis not present

## 2018-05-04 DIAGNOSIS — E876 Hypokalemia: Secondary | ICD-10-CM | POA: Diagnosis not present

## 2018-05-04 DIAGNOSIS — N186 End stage renal disease: Secondary | ICD-10-CM | POA: Diagnosis not present

## 2018-05-04 DIAGNOSIS — D631 Anemia in chronic kidney disease: Secondary | ICD-10-CM | POA: Diagnosis not present

## 2018-05-04 DIAGNOSIS — N2581 Secondary hyperparathyroidism of renal origin: Secondary | ICD-10-CM | POA: Diagnosis not present

## 2018-05-04 NOTE — Progress Notes (Addendum)
Subjective:   Mrs. ANU STAGNER presents today with cc of painful right foot. She states a hard piece of callus came off of her foot. Patient states foot has been bothering her for about a week .  Patient denies any fever, chills, nightsweats, nausea or vomiting.  Mrs. Yandell states she has not been in the office because she has been dealing with a lot of other health issues.   Allergies  Allergen Reactions  . Atenolol Other (See Comments)    Exacerbates gout Exacerbates gout  . Adhesive [Tape] Other (See Comments)    SKIN IS VERY SENSITIVE AND BRUISES AND TEARS EASILY; PLEASE USE AN ALTERNATIVE TO TAPE!!  . Contrast Media [Iodinated Diagnostic Agents] Rash  . Latex Rash  . Omnipaque [Iohexol] Rash     Objective:   73 y.o. WF, WD, WN in NAD. AAO x 3.   DP/PT pulses palpable b/l.  Capillary refill time <3 seconds.  Ulceration #1 :located left 4th toe, bulbous in appearance, with lateral ulceration measuring 1.0 x 0.7 x 0.1 cm with granular base. No erythema, no edema, no drainage, no odor, no tracking/tunneling.  Ulceration #2: Submetatarsal head 1 left foot measures 0.5 x 0.5 x 0.1 cm with no edema, no erythema, no drainage, no odor, no tracking/tunneling.  Ulceration #3:  Plantar midfoot right foot measures 0.5 x 0.3 x 0.1 cm with no edema, no drainage, no odor, no tracking/tunneling. +Mild periulcerative erythema.   Xray b/l  with no evidence of gas in tissues.   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  Diabetic Ulceration submetatarsal head 1 left, plantar midfoot right  2. Suspected chronic osteomyelitis left 4th digit 3.  NIDDM with peripheral neuropathy  Plan: 1. Ulcers were debrided and reactive hyperkeratoses and necrotic tissue was resected to the level of bleeding or viable tissue. Ulcers were cleansed with  wound cleanser. Medhoney was applied to base of all ulcerations with light dressing. 2. Xray performed bilateral feet. 3. Surgical shoe was dispensed for left foot. 4. Patient was given instructions on offloading and dressing change/aftercare and was instructed to call immediately if any signs or symptoms of infection arise.  5. Patient is to follow up in one week with [name]. 6. Prescription sent for Clindamycin 150 mg po tid x 7 days. 7. Patient instructed to report to emergency department with worsening appearance of foot, increased pain, foul odor, increased redness, swelling, drainage, fever, chills, nightsweats, nausea, vomiting, increased blood sugar. Patient/POA related understanding.

## 2018-05-06 DIAGNOSIS — N2581 Secondary hyperparathyroidism of renal origin: Secondary | ICD-10-CM | POA: Diagnosis not present

## 2018-05-06 DIAGNOSIS — N186 End stage renal disease: Secondary | ICD-10-CM | POA: Diagnosis not present

## 2018-05-06 DIAGNOSIS — D631 Anemia in chronic kidney disease: Secondary | ICD-10-CM | POA: Diagnosis not present

## 2018-05-06 DIAGNOSIS — E876 Hypokalemia: Secondary | ICD-10-CM | POA: Diagnosis not present

## 2018-05-06 DIAGNOSIS — D509 Iron deficiency anemia, unspecified: Secondary | ICD-10-CM | POA: Diagnosis not present

## 2018-05-06 DIAGNOSIS — E1129 Type 2 diabetes mellitus with other diabetic kidney complication: Secondary | ICD-10-CM | POA: Diagnosis not present

## 2018-05-07 DIAGNOSIS — E1122 Type 2 diabetes mellitus with diabetic chronic kidney disease: Secondary | ICD-10-CM | POA: Diagnosis not present

## 2018-05-07 DIAGNOSIS — N186 End stage renal disease: Secondary | ICD-10-CM | POA: Diagnosis not present

## 2018-05-07 DIAGNOSIS — Z992 Dependence on renal dialysis: Secondary | ICD-10-CM | POA: Diagnosis not present

## 2018-05-08 DIAGNOSIS — E1129 Type 2 diabetes mellitus with other diabetic kidney complication: Secondary | ICD-10-CM | POA: Diagnosis not present

## 2018-05-08 DIAGNOSIS — Z992 Dependence on renal dialysis: Secondary | ICD-10-CM | POA: Diagnosis not present

## 2018-05-08 DIAGNOSIS — N186 End stage renal disease: Secondary | ICD-10-CM | POA: Diagnosis not present

## 2018-05-08 DIAGNOSIS — E876 Hypokalemia: Secondary | ICD-10-CM | POA: Diagnosis not present

## 2018-05-08 DIAGNOSIS — E1122 Type 2 diabetes mellitus with diabetic chronic kidney disease: Secondary | ICD-10-CM | POA: Diagnosis not present

## 2018-05-08 DIAGNOSIS — D631 Anemia in chronic kidney disease: Secondary | ICD-10-CM | POA: Diagnosis not present

## 2018-05-08 DIAGNOSIS — D509 Iron deficiency anemia, unspecified: Secondary | ICD-10-CM | POA: Diagnosis not present

## 2018-05-08 DIAGNOSIS — N2581 Secondary hyperparathyroidism of renal origin: Secondary | ICD-10-CM | POA: Diagnosis not present

## 2018-05-11 DIAGNOSIS — E876 Hypokalemia: Secondary | ICD-10-CM | POA: Diagnosis not present

## 2018-05-11 DIAGNOSIS — E1129 Type 2 diabetes mellitus with other diabetic kidney complication: Secondary | ICD-10-CM | POA: Diagnosis not present

## 2018-05-11 DIAGNOSIS — D631 Anemia in chronic kidney disease: Secondary | ICD-10-CM | POA: Diagnosis not present

## 2018-05-11 DIAGNOSIS — N186 End stage renal disease: Secondary | ICD-10-CM | POA: Diagnosis not present

## 2018-05-11 DIAGNOSIS — D509 Iron deficiency anemia, unspecified: Secondary | ICD-10-CM | POA: Diagnosis not present

## 2018-05-11 DIAGNOSIS — N2581 Secondary hyperparathyroidism of renal origin: Secondary | ICD-10-CM | POA: Diagnosis not present

## 2018-05-13 DIAGNOSIS — D509 Iron deficiency anemia, unspecified: Secondary | ICD-10-CM | POA: Diagnosis not present

## 2018-05-13 DIAGNOSIS — E876 Hypokalemia: Secondary | ICD-10-CM | POA: Diagnosis not present

## 2018-05-13 DIAGNOSIS — M869 Osteomyelitis, unspecified: Secondary | ICD-10-CM | POA: Diagnosis not present

## 2018-05-13 DIAGNOSIS — E1129 Type 2 diabetes mellitus with other diabetic kidney complication: Secondary | ICD-10-CM | POA: Diagnosis not present

## 2018-05-13 DIAGNOSIS — N2581 Secondary hyperparathyroidism of renal origin: Secondary | ICD-10-CM | POA: Diagnosis not present

## 2018-05-13 DIAGNOSIS — N186 End stage renal disease: Secondary | ICD-10-CM | POA: Diagnosis not present

## 2018-05-13 DIAGNOSIS — D631 Anemia in chronic kidney disease: Secondary | ICD-10-CM | POA: Diagnosis not present

## 2018-05-14 LAB — CBC WITH DIFFERENTIAL/PLATELET
Absolute Monocytes: 818 cells/uL (ref 200–950)
BASOS PCT: 0.3 %
Basophils Absolute: 23 cells/uL (ref 0–200)
EOS ABS: 150 {cells}/uL (ref 15–500)
Eosinophils Relative: 2 %
HCT: 35.9 % (ref 35.0–45.0)
HEMOGLOBIN: 11.7 g/dL (ref 11.7–15.5)
LYMPHS ABS: 1238 {cells}/uL (ref 850–3900)
MCH: 29.3 pg (ref 27.0–33.0)
MCHC: 32.6 g/dL (ref 32.0–36.0)
MCV: 90 fL (ref 80.0–100.0)
MONOS PCT: 10.9 %
MPV: 10.7 fL (ref 7.5–12.5)
Neutro Abs: 5273 cells/uL (ref 1500–7800)
Neutrophils Relative %: 70.3 %
Platelets: 223 10*3/uL (ref 140–400)
RBC: 3.99 10*6/uL (ref 3.80–5.10)
RDW: 15.5 % — ABNORMAL HIGH (ref 11.0–15.0)
Total Lymphocyte: 16.5 %
WBC: 7.5 10*3/uL (ref 3.8–10.8)

## 2018-05-14 LAB — C-REACTIVE PROTEIN: CRP: 3 mg/L (ref <8.0)

## 2018-05-14 LAB — SEDIMENTATION RATE: Sed Rate: 9 mm/h (ref 0–30)

## 2018-05-15 DIAGNOSIS — E1129 Type 2 diabetes mellitus with other diabetic kidney complication: Secondary | ICD-10-CM | POA: Diagnosis not present

## 2018-05-15 DIAGNOSIS — N186 End stage renal disease: Secondary | ICD-10-CM | POA: Diagnosis not present

## 2018-05-15 DIAGNOSIS — D509 Iron deficiency anemia, unspecified: Secondary | ICD-10-CM | POA: Diagnosis not present

## 2018-05-15 DIAGNOSIS — D631 Anemia in chronic kidney disease: Secondary | ICD-10-CM | POA: Diagnosis not present

## 2018-05-15 DIAGNOSIS — N2581 Secondary hyperparathyroidism of renal origin: Secondary | ICD-10-CM | POA: Diagnosis not present

## 2018-05-15 DIAGNOSIS — E876 Hypokalemia: Secondary | ICD-10-CM | POA: Diagnosis not present

## 2018-05-17 ENCOUNTER — Encounter: Payer: Self-pay | Admitting: Podiatry

## 2018-05-17 ENCOUNTER — Ambulatory Visit (INDEPENDENT_AMBULATORY_CARE_PROVIDER_SITE_OTHER): Payer: Medicare Other | Admitting: Podiatry

## 2018-05-17 VITALS — Temp 97.8°F

## 2018-05-17 DIAGNOSIS — L97529 Non-pressure chronic ulcer of other part of left foot with unspecified severity: Secondary | ICD-10-CM | POA: Diagnosis not present

## 2018-05-17 DIAGNOSIS — I25810 Atherosclerosis of coronary artery bypass graft(s) without angina pectoris: Secondary | ICD-10-CM

## 2018-05-17 DIAGNOSIS — M86672 Other chronic osteomyelitis, left ankle and foot: Secondary | ICD-10-CM | POA: Diagnosis not present

## 2018-05-17 DIAGNOSIS — E11621 Type 2 diabetes mellitus with foot ulcer: Secondary | ICD-10-CM | POA: Diagnosis not present

## 2018-05-17 DIAGNOSIS — E1149 Type 2 diabetes mellitus with other diabetic neurological complication: Secondary | ICD-10-CM | POA: Diagnosis not present

## 2018-05-18 ENCOUNTER — Telehealth: Payer: Self-pay | Admitting: *Deleted

## 2018-05-18 DIAGNOSIS — E1129 Type 2 diabetes mellitus with other diabetic kidney complication: Secondary | ICD-10-CM | POA: Diagnosis not present

## 2018-05-18 DIAGNOSIS — E876 Hypokalemia: Secondary | ICD-10-CM | POA: Diagnosis not present

## 2018-05-18 DIAGNOSIS — N186 End stage renal disease: Secondary | ICD-10-CM | POA: Diagnosis not present

## 2018-05-18 DIAGNOSIS — D631 Anemia in chronic kidney disease: Secondary | ICD-10-CM | POA: Diagnosis not present

## 2018-05-18 DIAGNOSIS — D509 Iron deficiency anemia, unspecified: Secondary | ICD-10-CM | POA: Diagnosis not present

## 2018-05-18 DIAGNOSIS — N2581 Secondary hyperparathyroidism of renal origin: Secondary | ICD-10-CM | POA: Diagnosis not present

## 2018-05-18 NOTE — Telephone Encounter (Signed)
-----   Message from [name], DPM sent at 05/17/2018  7:01 AM EST ----- Val - please let her know that her blood work is normal and based on this there is likely not an active bone infection. Thanks.

## 2018-05-18 NOTE — Telephone Encounter (Signed)
Left message for request call back.

## 2018-05-18 NOTE — Telephone Encounter (Signed)
Left message for pt to call for results  

## 2018-05-18 NOTE — Progress Notes (Signed)
Subjective: 73 year old female presents the office today for follow-up evaluation of a wound in the left fourth toe as well as the right foot.  Overall she states that she is doing better the area is callused over.  She denies any open sores and she denies any redness or drainage or any swelling.  Feet.  She has no new concerns and she states that she is doing great.  She did have her blood work performed since I last saw her. Denies any systemic complaints such as fevers, chills, nausea, vomiting. No acute changes since last appointment, and no other complaints at this time.   Objective: AAO x3, NAD DP/PT pulses palpable bilaterally, CRT less than 3 seconds The plantar aspect of the midfoot on the right side is a hyperkeratotic lesion.  Upon debridement there is no underlying ulceration, drainage or any signs of infection.  No open lesions identified on the right side. LEFT FOOT: Along the lateral aspect of the fourth toe is a hyperkeratotic lesion, scab.  After debrided this area there is no underlying ulceration drainage or any signs of infection.  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  There is no signs of infection identified today and there is no ulcerations. 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  -There is a scab present left fourth toe which is debrided to reveal that there is new, healthy skin present there is no open sores identified.  I reviewed the blood work with her today.  Her blood work does have any evidence of acute osteomyelitis or infection.  The x-ray findings are likely due to either chronic osteomyelitis or injury from the position of the toe.  We will continue to monitor this and we discussed present  signs or symptoms of infection to call the office or go to the emergency room. -Monitor for any clinical signs or symptoms of infection and directed to call the office immediately should any occur or go to the ER. -Follow back with her in 6 weeks for routine care and to check areas or sooner if any issues are to arise.  Call any questions or concerns.  [name] DPM

## 2018-05-19 ENCOUNTER — Other Ambulatory Visit: Payer: Self-pay | Admitting: Internal Medicine

## 2018-05-19 DIAGNOSIS — Z992 Dependence on renal dialysis: Secondary | ICD-10-CM | POA: Diagnosis not present

## 2018-05-19 DIAGNOSIS — N186 End stage renal disease: Secondary | ICD-10-CM | POA: Diagnosis not present

## 2018-05-19 DIAGNOSIS — K420 Umbilical hernia with obstruction, without gangrene: Secondary | ICD-10-CM | POA: Diagnosis not present

## 2018-05-20 ENCOUNTER — Encounter: Payer: Self-pay | Admitting: *Deleted

## 2018-05-20 DIAGNOSIS — N2581 Secondary hyperparathyroidism of renal origin: Secondary | ICD-10-CM | POA: Diagnosis not present

## 2018-05-20 DIAGNOSIS — E1129 Type 2 diabetes mellitus with other diabetic kidney complication: Secondary | ICD-10-CM | POA: Diagnosis not present

## 2018-05-20 DIAGNOSIS — E876 Hypokalemia: Secondary | ICD-10-CM | POA: Diagnosis not present

## 2018-05-20 DIAGNOSIS — N186 End stage renal disease: Secondary | ICD-10-CM | POA: Diagnosis not present

## 2018-05-20 DIAGNOSIS — D631 Anemia in chronic kidney disease: Secondary | ICD-10-CM | POA: Diagnosis not present

## 2018-05-20 DIAGNOSIS — D509 Iron deficiency anemia, unspecified: Secondary | ICD-10-CM | POA: Diagnosis not present

## 2018-05-20 NOTE — Telephone Encounter (Signed)
Left message requesting a call to discuss results. Mailed note with Dr. Leigh Aurora review of results 05/17/2018 7:01am.

## 2018-05-20 NOTE — Telephone Encounter (Signed)
Unable to contact pt by mobile phone voicemail box is not set up.

## 2018-05-21 NOTE — Telephone Encounter (Signed)
Pt called and I informed pt of Dr. Leigh Aurora review of blood work.

## 2018-05-22 DIAGNOSIS — D631 Anemia in chronic kidney disease: Secondary | ICD-10-CM | POA: Diagnosis not present

## 2018-05-22 DIAGNOSIS — E876 Hypokalemia: Secondary | ICD-10-CM | POA: Diagnosis not present

## 2018-05-22 DIAGNOSIS — E1129 Type 2 diabetes mellitus with other diabetic kidney complication: Secondary | ICD-10-CM | POA: Diagnosis not present

## 2018-05-22 DIAGNOSIS — D509 Iron deficiency anemia, unspecified: Secondary | ICD-10-CM | POA: Diagnosis not present

## 2018-05-22 DIAGNOSIS — N186 End stage renal disease: Secondary | ICD-10-CM | POA: Diagnosis not present

## 2018-05-22 DIAGNOSIS — N2581 Secondary hyperparathyroidism of renal origin: Secondary | ICD-10-CM | POA: Diagnosis not present

## 2018-05-24 DIAGNOSIS — N186 End stage renal disease: Secondary | ICD-10-CM | POA: Insufficient documentation

## 2018-05-25 DIAGNOSIS — D631 Anemia in chronic kidney disease: Secondary | ICD-10-CM | POA: Diagnosis not present

## 2018-05-25 DIAGNOSIS — D509 Iron deficiency anemia, unspecified: Secondary | ICD-10-CM | POA: Diagnosis not present

## 2018-05-25 DIAGNOSIS — E876 Hypokalemia: Secondary | ICD-10-CM | POA: Diagnosis not present

## 2018-05-25 DIAGNOSIS — N186 End stage renal disease: Secondary | ICD-10-CM | POA: Diagnosis not present

## 2018-05-25 DIAGNOSIS — N2581 Secondary hyperparathyroidism of renal origin: Secondary | ICD-10-CM | POA: Diagnosis not present

## 2018-05-25 DIAGNOSIS — E1129 Type 2 diabetes mellitus with other diabetic kidney complication: Secondary | ICD-10-CM | POA: Diagnosis not present

## 2018-05-27 DIAGNOSIS — N2581 Secondary hyperparathyroidism of renal origin: Secondary | ICD-10-CM | POA: Diagnosis not present

## 2018-05-27 DIAGNOSIS — E876 Hypokalemia: Secondary | ICD-10-CM | POA: Diagnosis not present

## 2018-05-27 DIAGNOSIS — E1129 Type 2 diabetes mellitus with other diabetic kidney complication: Secondary | ICD-10-CM | POA: Diagnosis not present

## 2018-05-27 DIAGNOSIS — D509 Iron deficiency anemia, unspecified: Secondary | ICD-10-CM | POA: Diagnosis not present

## 2018-05-27 DIAGNOSIS — D631 Anemia in chronic kidney disease: Secondary | ICD-10-CM | POA: Diagnosis not present

## 2018-05-27 DIAGNOSIS — N186 End stage renal disease: Secondary | ICD-10-CM | POA: Diagnosis not present

## 2018-05-28 DIAGNOSIS — I447 Left bundle-branch block, unspecified: Secondary | ICD-10-CM | POA: Diagnosis not present

## 2018-05-28 DIAGNOSIS — K429 Umbilical hernia without obstruction or gangrene: Secondary | ICD-10-CM | POA: Diagnosis not present

## 2018-05-28 DIAGNOSIS — Z01818 Encounter for other preprocedural examination: Secondary | ICD-10-CM | POA: Diagnosis not present

## 2018-05-29 DIAGNOSIS — E1129 Type 2 diabetes mellitus with other diabetic kidney complication: Secondary | ICD-10-CM | POA: Diagnosis not present

## 2018-05-29 DIAGNOSIS — E876 Hypokalemia: Secondary | ICD-10-CM | POA: Diagnosis not present

## 2018-05-29 DIAGNOSIS — D631 Anemia in chronic kidney disease: Secondary | ICD-10-CM | POA: Diagnosis not present

## 2018-05-29 DIAGNOSIS — D509 Iron deficiency anemia, unspecified: Secondary | ICD-10-CM | POA: Diagnosis not present

## 2018-05-29 DIAGNOSIS — N2581 Secondary hyperparathyroidism of renal origin: Secondary | ICD-10-CM | POA: Diagnosis not present

## 2018-05-29 DIAGNOSIS — N186 End stage renal disease: Secondary | ICD-10-CM | POA: Diagnosis not present

## 2018-05-31 DIAGNOSIS — D509 Iron deficiency anemia, unspecified: Secondary | ICD-10-CM | POA: Diagnosis not present

## 2018-05-31 DIAGNOSIS — N2581 Secondary hyperparathyroidism of renal origin: Secondary | ICD-10-CM | POA: Diagnosis not present

## 2018-05-31 DIAGNOSIS — E876 Hypokalemia: Secondary | ICD-10-CM | POA: Diagnosis not present

## 2018-05-31 DIAGNOSIS — N186 End stage renal disease: Secondary | ICD-10-CM | POA: Diagnosis not present

## 2018-05-31 DIAGNOSIS — E1129 Type 2 diabetes mellitus with other diabetic kidney complication: Secondary | ICD-10-CM | POA: Diagnosis not present

## 2018-05-31 DIAGNOSIS — D631 Anemia in chronic kidney disease: Secondary | ICD-10-CM | POA: Diagnosis not present

## 2018-06-01 DIAGNOSIS — I10 Essential (primary) hypertension: Secondary | ICD-10-CM | POA: Diagnosis not present

## 2018-06-01 DIAGNOSIS — N186 End stage renal disease: Secondary | ICD-10-CM | POA: Diagnosis not present

## 2018-06-01 DIAGNOSIS — I132 Hypertensive heart and chronic kidney disease with heart failure and with stage 5 chronic kidney disease, or end stage renal disease: Secondary | ICD-10-CM | POA: Diagnosis not present

## 2018-06-01 DIAGNOSIS — Z992 Dependence on renal dialysis: Secondary | ICD-10-CM | POA: Diagnosis not present

## 2018-06-01 DIAGNOSIS — E1122 Type 2 diabetes mellitus with diabetic chronic kidney disease: Secondary | ICD-10-CM | POA: Diagnosis not present

## 2018-06-01 DIAGNOSIS — I509 Heart failure, unspecified: Secondary | ICD-10-CM | POA: Diagnosis not present

## 2018-06-01 DIAGNOSIS — Z794 Long term (current) use of insulin: Secondary | ICD-10-CM | POA: Diagnosis not present

## 2018-06-01 DIAGNOSIS — K420 Umbilical hernia with obstruction, without gangrene: Secondary | ICD-10-CM | POA: Diagnosis not present

## 2018-06-01 DIAGNOSIS — Z4902 Encounter for fitting and adjustment of peritoneal dialysis catheter: Secondary | ICD-10-CM | POA: Diagnosis not present

## 2018-06-01 DIAGNOSIS — N185 Chronic kidney disease, stage 5: Secondary | ICD-10-CM | POA: Diagnosis not present

## 2018-06-02 DIAGNOSIS — Z452 Encounter for adjustment and management of vascular access device: Secondary | ICD-10-CM | POA: Diagnosis not present

## 2018-06-03 DIAGNOSIS — D631 Anemia in chronic kidney disease: Secondary | ICD-10-CM | POA: Diagnosis not present

## 2018-06-03 DIAGNOSIS — E1129 Type 2 diabetes mellitus with other diabetic kidney complication: Secondary | ICD-10-CM | POA: Diagnosis not present

## 2018-06-03 DIAGNOSIS — E876 Hypokalemia: Secondary | ICD-10-CM | POA: Diagnosis not present

## 2018-06-03 DIAGNOSIS — N186 End stage renal disease: Secondary | ICD-10-CM | POA: Diagnosis not present

## 2018-06-03 DIAGNOSIS — N2581 Secondary hyperparathyroidism of renal origin: Secondary | ICD-10-CM | POA: Diagnosis not present

## 2018-06-03 DIAGNOSIS — D509 Iron deficiency anemia, unspecified: Secondary | ICD-10-CM | POA: Diagnosis not present

## 2018-06-05 DIAGNOSIS — N186 End stage renal disease: Secondary | ICD-10-CM | POA: Diagnosis not present

## 2018-06-05 DIAGNOSIS — N2581 Secondary hyperparathyroidism of renal origin: Secondary | ICD-10-CM | POA: Diagnosis not present

## 2018-06-05 DIAGNOSIS — D631 Anemia in chronic kidney disease: Secondary | ICD-10-CM | POA: Diagnosis not present

## 2018-06-05 DIAGNOSIS — E876 Hypokalemia: Secondary | ICD-10-CM | POA: Diagnosis not present

## 2018-06-05 DIAGNOSIS — E1129 Type 2 diabetes mellitus with other diabetic kidney complication: Secondary | ICD-10-CM | POA: Diagnosis not present

## 2018-06-05 DIAGNOSIS — D509 Iron deficiency anemia, unspecified: Secondary | ICD-10-CM | POA: Diagnosis not present

## 2018-06-08 DIAGNOSIS — D631 Anemia in chronic kidney disease: Secondary | ICD-10-CM | POA: Diagnosis not present

## 2018-06-08 DIAGNOSIS — N2581 Secondary hyperparathyroidism of renal origin: Secondary | ICD-10-CM | POA: Diagnosis not present

## 2018-06-08 DIAGNOSIS — N186 End stage renal disease: Secondary | ICD-10-CM | POA: Diagnosis not present

## 2018-06-08 DIAGNOSIS — E876 Hypokalemia: Secondary | ICD-10-CM | POA: Diagnosis not present

## 2018-06-08 DIAGNOSIS — D509 Iron deficiency anemia, unspecified: Secondary | ICD-10-CM | POA: Diagnosis not present

## 2018-06-10 DIAGNOSIS — D631 Anemia in chronic kidney disease: Secondary | ICD-10-CM | POA: Diagnosis not present

## 2018-06-10 DIAGNOSIS — E876 Hypokalemia: Secondary | ICD-10-CM | POA: Diagnosis not present

## 2018-06-10 DIAGNOSIS — N186 End stage renal disease: Secondary | ICD-10-CM | POA: Diagnosis not present

## 2018-06-10 DIAGNOSIS — D509 Iron deficiency anemia, unspecified: Secondary | ICD-10-CM | POA: Diagnosis not present

## 2018-06-10 DIAGNOSIS — N2581 Secondary hyperparathyroidism of renal origin: Secondary | ICD-10-CM | POA: Diagnosis not present

## 2018-06-12 DIAGNOSIS — N2581 Secondary hyperparathyroidism of renal origin: Secondary | ICD-10-CM | POA: Diagnosis not present

## 2018-06-12 DIAGNOSIS — N186 End stage renal disease: Secondary | ICD-10-CM | POA: Diagnosis not present

## 2018-06-12 DIAGNOSIS — E876 Hypokalemia: Secondary | ICD-10-CM | POA: Diagnosis not present

## 2018-06-12 DIAGNOSIS — D631 Anemia in chronic kidney disease: Secondary | ICD-10-CM | POA: Diagnosis not present

## 2018-06-12 DIAGNOSIS — D509 Iron deficiency anemia, unspecified: Secondary | ICD-10-CM | POA: Diagnosis not present

## 2018-06-15 DIAGNOSIS — E876 Hypokalemia: Secondary | ICD-10-CM | POA: Diagnosis not present

## 2018-06-15 DIAGNOSIS — D631 Anemia in chronic kidney disease: Secondary | ICD-10-CM | POA: Diagnosis not present

## 2018-06-15 DIAGNOSIS — N186 End stage renal disease: Secondary | ICD-10-CM | POA: Diagnosis not present

## 2018-06-15 DIAGNOSIS — N2581 Secondary hyperparathyroidism of renal origin: Secondary | ICD-10-CM | POA: Diagnosis not present

## 2018-06-15 DIAGNOSIS — D509 Iron deficiency anemia, unspecified: Secondary | ICD-10-CM | POA: Diagnosis not present

## 2018-06-16 ENCOUNTER — Other Ambulatory Visit: Payer: Self-pay | Admitting: Internal Medicine

## 2018-06-17 DIAGNOSIS — D509 Iron deficiency anemia, unspecified: Secondary | ICD-10-CM | POA: Diagnosis not present

## 2018-06-17 DIAGNOSIS — E876 Hypokalemia: Secondary | ICD-10-CM | POA: Diagnosis not present

## 2018-06-17 DIAGNOSIS — D631 Anemia in chronic kidney disease: Secondary | ICD-10-CM | POA: Diagnosis not present

## 2018-06-17 DIAGNOSIS — N186 End stage renal disease: Secondary | ICD-10-CM | POA: Diagnosis not present

## 2018-06-17 DIAGNOSIS — N2581 Secondary hyperparathyroidism of renal origin: Secondary | ICD-10-CM | POA: Diagnosis not present

## 2018-06-19 DIAGNOSIS — E876 Hypokalemia: Secondary | ICD-10-CM | POA: Diagnosis not present

## 2018-06-19 DIAGNOSIS — D631 Anemia in chronic kidney disease: Secondary | ICD-10-CM | POA: Diagnosis not present

## 2018-06-19 DIAGNOSIS — D509 Iron deficiency anemia, unspecified: Secondary | ICD-10-CM | POA: Diagnosis not present

## 2018-06-19 DIAGNOSIS — N186 End stage renal disease: Secondary | ICD-10-CM | POA: Diagnosis not present

## 2018-06-19 DIAGNOSIS — N2581 Secondary hyperparathyroidism of renal origin: Secondary | ICD-10-CM | POA: Diagnosis not present

## 2018-06-21 ENCOUNTER — Ambulatory Visit (INDEPENDENT_AMBULATORY_CARE_PROVIDER_SITE_OTHER): Payer: Medicare Other | Admitting: Podiatry

## 2018-06-21 ENCOUNTER — Other Ambulatory Visit: Payer: Self-pay

## 2018-06-21 ENCOUNTER — Encounter: Payer: Self-pay | Admitting: Podiatry

## 2018-06-21 VITALS — Temp 99.2°F

## 2018-06-21 DIAGNOSIS — L97512 Non-pressure chronic ulcer of 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d: Secondary | ICD-10-CM

## 2018-06-21 DIAGNOSIS — L03115 Cellulitis of right lower limb: Secondary | ICD-10-CM

## 2018-06-21 DIAGNOSIS — E1149 Type 2 diabetes mellitus with other diabetic neurological complication: Secondary | ICD-10-CM | POA: Diagnosis not present

## 2018-06-21 DIAGNOSIS — I25810 Atherosclerosis of coronary artery bypass graft(s) without angina pectoris: Secondary | ICD-10-CM

## 2018-06-21 DIAGNOSIS — M869 Osteomyelitis, unspecified: Secondary | ICD-10-CM | POA: Diagnosis not present

## 2018-06-21 MED ORDER — DOXYCYCLINE HYCLATE 100 MG PO TABS: 100.0000 mg | ORAL_TABLET | Freq: Two times a day (BID) | ORAL | 0 refills | Status: DC

## 2018-06-22 DIAGNOSIS — N186 End stage renal disease: Secondary | ICD-10-CM | POA: Diagnosis not present

## 2018-06-22 DIAGNOSIS — D509 Iron deficiency anemia, unspecified: Secondary | ICD-10-CM | POA: Diagnosis not present

## 2018-06-22 DIAGNOSIS — K769 Liver disease, unspecified: Secondary | ICD-10-CM | POA: Diagnosis not present

## 2018-06-22 DIAGNOSIS — R82992 Hyperoxaluria: Secondary | ICD-10-CM | POA: Diagnosis not present

## 2018-06-22 DIAGNOSIS — E7841 Elevated Lipoprotein(a): Secondary | ICD-10-CM | POA: Diagnosis not present

## 2018-06-22 DIAGNOSIS — E440 Moderate protein-calorie malnutrition: Secondary | ICD-10-CM | POA: Diagnosis not present

## 2018-06-22 DIAGNOSIS — D631 Anemia in chronic kidney disease: Secondary | ICD-10-CM | POA: Diagnosis not present

## 2018-06-22 DIAGNOSIS — N2581 Secondary hyperparathyroidism of renal origin: Secondary | ICD-10-CM | POA: Diagnosis not present

## 2018-06-22 DIAGNOSIS — D513 Other dietary vitamin B12 deficiency anemia: Secondary | ICD-10-CM | POA: Diagnosis not present

## 2018-06-22 DIAGNOSIS — Z23 Encounter for immunization: Secondary | ICD-10-CM | POA: Diagnosis not present

## 2018-06-22 DIAGNOSIS — N2589 Other disorders resulting from impaired renal tubular function: Secondary | ICD-10-CM | POA: Diagnosis not present

## 2018-06-22 DIAGNOSIS — Z79899 Other long term (current) drug therapy: Secondary | ICD-10-CM | POA: Diagnosis not present

## 2018-06-22 NOTE — Progress Notes (Signed)
Subjective: 73 year old female presents the office today for concerns of right foot infection.  She said the last couple of days she has noticed increasing swelling and redness to her right foot.  She states that she has not been looking much to her feet as she recently has undergone surgery for peritoneal dialysis.  She denies any drainage or pus coming from the wound on the bottom of her right foot.  The left foot is doing well.  She is currently not taking any antibiotics. Denies any systemic complaints such as fevers, chills, nausea, vomiting. No acute changes since last appointment, and no other complaints at this time.   Objective: AAO x3, NAD DP/PT pulses palpable bilaterally, CRT less than 3 seconds There is edema and erythema to the right foot mostly along the plantar aspect as well as the medial aspect foot.  There is no fluctuation crepitation but there is a central wound along the midfoot area measuring 1 x 1 cm with a fibro-granular base prior to debridement.  After debridement wound was the same size applied debrided the wound to healthy, bleeding tissue.  There is no probing, undermining or tunneling.  No malodor.  No clinical signs of an abscess. No open lesions or pre-ulcerative lesions.  No pain with calf compression, swelling, warmth, erythema  Assessment: Right foot ulceration with cellulitis  Plan: -All treatment options discussed with the patient including all alternatives, risks, complications.  -I debrided the wound utilizing 312 with scalpel utilizing a tissue nipper as well as a #312 with scalpel down to healthy, bleeding tissue.  Recommend switch to using medihoney dressing changes daily.  Would hold off on the alginate dressing. -Prescribe doxycycline -Surgical shoe with offloading pad dispensed -Likely can start dialysis tomorrow.  She is tonbe training for the peritoneal dialysis. -Wound culture was obtained -Monitor for any clinical signs or symptoms of infection  and directed to call the office immediately should any occur or go to the ER. -RTC 1 week or sooner if needed  [name] DPM

## 2018-06-23 DIAGNOSIS — D513 Other dietary vitamin B12 deficiency anemia: Secondary | ICD-10-CM | POA: Diagnosis not present

## 2018-06-23 DIAGNOSIS — N186 End stage renal disease: Secondary | ICD-10-CM | POA: Diagnosis not present

## 2018-06-23 DIAGNOSIS — Z79899 Other long term (current) drug therapy: Secondary | ICD-10-CM | POA: Diagnosis not present

## 2018-06-23 DIAGNOSIS — D509 Iron deficiency anemia, unspecified: Secondary | ICD-10-CM | POA: Diagnosis not present

## 2018-06-23 DIAGNOSIS — D631 Anemia in chronic kidney disease: Secondary | ICD-10-CM | POA: Diagnosis not present

## 2018-06-23 DIAGNOSIS — K769 Liver disease, unspecified: Secondary | ICD-10-CM | POA: Diagnosis not present

## 2018-06-24 DIAGNOSIS — D513 Other dietary vitamin B12 deficiency anemia: Secondary | ICD-10-CM | POA: Diagnosis not present

## 2018-06-24 DIAGNOSIS — D509 Iron deficiency anemia, unspecified: Secondary | ICD-10-CM | POA: Diagnosis not present

## 2018-06-24 DIAGNOSIS — D631 Anemia in chronic kidney disease: Secondary | ICD-10-CM | POA: Diagnosis not present

## 2018-06-24 DIAGNOSIS — K769 Liver disease, unspecified: Secondary | ICD-10-CM | POA: Diagnosis not present

## 2018-06-24 DIAGNOSIS — N186 End stage renal disease: Secondary | ICD-10-CM | POA: Diagnosis not present

## 2018-06-24 DIAGNOSIS — Z79899 Other long term (current) drug therapy: Secondary | ICD-10-CM | POA: Diagnosis not present

## 2018-06-24 LAB — WOUND CULTURE
MICRO NUMBER:: 323511
SPECIMEN QUALITY:: ADEQUATE

## 2018-06-25 DIAGNOSIS — Z79899 Other long term (current) drug therapy: Secondary | ICD-10-CM | POA: Diagnosis not present

## 2018-06-25 DIAGNOSIS — D509 Iron deficiency anemia, unspecified: Secondary | ICD-10-CM | POA: Diagnosis not present

## 2018-06-25 DIAGNOSIS — D513 Other dietary vitamin B12 deficiency anemia: Secondary | ICD-10-CM | POA: Diagnosis not present

## 2018-06-25 DIAGNOSIS — D631 Anemia in chronic kidney disease: Secondary | ICD-10-CM | POA: Diagnosis not present

## 2018-06-25 DIAGNOSIS — K769 Liver disease, unspecified: Secondary | ICD-10-CM | POA: Diagnosis not present

## 2018-06-25 DIAGNOSIS — N186 End stage renal disease: Secondary | ICD-10-CM | POA: Diagnosis not present

## 2018-06-28 ENCOUNTER — Telehealth: Payer: Self-pay | Admitting: Podiatry

## 2018-06-28 ENCOUNTER — Other Ambulatory Visit: Payer: Self-pay | Admitting: Internal Medicine

## 2018-06-28 ENCOUNTER — Ambulatory Visit: Payer: Medicare Other | Admitting: Podiatry

## 2018-06-28 DIAGNOSIS — K769 Liver disease, unspecified: Secondary | ICD-10-CM | POA: Diagnosis not present

## 2018-06-28 DIAGNOSIS — D509 Iron deficiency anemia, unspecified: Secondary | ICD-10-CM | POA: Diagnosis not present

## 2018-06-28 DIAGNOSIS — D513 Other dietary vitamin B12 deficiency anemia: Secondary | ICD-10-CM | POA: Diagnosis not present

## 2018-06-28 DIAGNOSIS — N186 End stage renal disease: Secondary | ICD-10-CM | POA: Diagnosis not present

## 2018-06-28 DIAGNOSIS — D631 Anemia in chronic kidney disease: Secondary | ICD-10-CM | POA: Diagnosis not present

## 2018-06-28 DIAGNOSIS — Z79899 Other long term (current) drug therapy: Secondary | ICD-10-CM | POA: Diagnosis not present

## 2018-06-28 NOTE — Telephone Encounter (Signed)
I sent message to A. [place] to review if had been just scheduled for nail trim or the ulcer treatment.

## 2018-06-28 NOTE — Telephone Encounter (Signed)
Patient called and wanted to know if she should still be seen after receiving a call her appt was canceled. Stated she has an ulcer of her foot besides the nail trim she needed.( She was  Informed that if it isn't an emergency they are requesting reschedules at this time.) Please call when possible.

## 2018-06-29 ENCOUNTER — Ambulatory Visit: Payer: Self-pay | Admitting: Adult Health

## 2018-06-29 ENCOUNTER — Ambulatory Visit (INDEPENDENT_AMBULATORY_CARE_PROVIDER_SITE_OTHER): Payer: Medicare Other | Admitting: Podiatry

## 2018-06-29 ENCOUNTER — Other Ambulatory Visit: Payer: Self-pay

## 2018-06-29 ENCOUNTER — Encounter: Payer: Self-pay | Admitting: Podiatry

## 2018-06-29 VITALS — Temp 98.1°F

## 2018-06-29 DIAGNOSIS — L97512 Non-pressure chronic ulcer of 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d: Secondary | ICD-10-CM

## 2018-06-29 DIAGNOSIS — L03115 Cellulitis of right lower limb: Secondary | ICD-10-CM

## 2018-06-29 DIAGNOSIS — K769 Liver disease, unspecified: Secondary | ICD-10-CM | POA: Diagnosis not present

## 2018-06-29 DIAGNOSIS — Z79899 Other long term (current) drug therapy: Secondary | ICD-10-CM | POA: Diagnosis not present

## 2018-06-29 DIAGNOSIS — N186 End stage renal disease: Secondary | ICD-10-CM | POA: Diagnosis not present

## 2018-06-29 DIAGNOSIS — D509 Iron deficiency anemia, unspecified: Secondary | ICD-10-CM | POA: Diagnosis not present

## 2018-06-29 DIAGNOSIS — I25810 Atherosclerosis of coronary artery bypass graft(s) without angina pectoris: Secondary | ICD-10-CM | POA: Diagnosis not present

## 2018-06-29 DIAGNOSIS — D513 Other dietary vitamin B12 deficiency anemia: Secondary | ICD-10-CM | POA: Diagnosis not present

## 2018-06-29 DIAGNOSIS — D631 Anemia in chronic kidney disease: Secondary | ICD-10-CM | POA: Diagnosis not present

## 2018-06-29 DIAGNOSIS — E1149 Type 2 diabetes mellitus with other diabetic neurological complication: Secondary | ICD-10-CM

## 2018-06-29 MED ORDER — DOXYCYCLINE HYCLATE 100 MG PO TABS: 100.0000 mg | ORAL_TABLET | Freq: Two times a day (BID) | ORAL | 0 refills | Status: DC

## 2018-06-30 DIAGNOSIS — N186 End stage renal disease: Secondary | ICD-10-CM | POA: Diagnosis not present

## 2018-06-30 DIAGNOSIS — K769 Liver disease, unspecified: Secondary | ICD-10-CM | POA: Diagnosis not present

## 2018-06-30 DIAGNOSIS — D509 Iron deficiency anemia, unspecified: Secondary | ICD-10-CM | POA: Diagnosis not present

## 2018-06-30 DIAGNOSIS — D631 Anemia in chronic kidney disease: Secondary | ICD-10-CM | POA: Diagnosis not present

## 2018-06-30 DIAGNOSIS — Z79899 Other long term (current) drug therapy: Secondary | ICD-10-CM | POA: Diagnosis not present

## 2018-06-30 DIAGNOSIS — D513 Other dietary vitamin B12 deficiency anemia: Secondary | ICD-10-CM | POA: Diagnosis not present

## 2018-07-01 DIAGNOSIS — D509 Iron deficiency anemia, unspecified: Secondary | ICD-10-CM | POA: Diagnosis not present

## 2018-07-01 DIAGNOSIS — D513 Other dietary vitamin B12 deficiency anemia: Secondary | ICD-10-CM | POA: Diagnosis not present

## 2018-07-01 DIAGNOSIS — K769 Liver disease, unspecified: Secondary | ICD-10-CM | POA: Diagnosis not present

## 2018-07-01 DIAGNOSIS — Z79899 Other long term (current) drug therapy: Secondary | ICD-10-CM | POA: Diagnosis not present

## 2018-07-01 DIAGNOSIS — N186 End stage renal disease: Secondary | ICD-10-CM | POA: Diagnosis not present

## 2018-07-01 DIAGNOSIS — D631 Anemia in chronic kidney disease: Secondary | ICD-10-CM | POA: Diagnosis not present

## 2018-07-02 DIAGNOSIS — N186 End stage renal disease: Secondary | ICD-10-CM | POA: Diagnosis not present

## 2018-07-02 DIAGNOSIS — N2581 Secondary hyperparathyroidism of renal origin: Secondary | ICD-10-CM | POA: Diagnosis not present

## 2018-07-05 DIAGNOSIS — N2581 Secondary hyperparathyroidism of renal origin: Secondary | ICD-10-CM | POA: Diagnosis not present

## 2018-07-05 DIAGNOSIS — N186 End stage renal disease: Secondary | ICD-10-CM | POA: Diagnosis not present

## 2018-07-05 NOTE — Progress Notes (Signed)
Subjective: 73 year old female presents the office today for evaluation of a wound, infection of the right foot.  She is still on the antibiotics and she has been doing well.  She denies any increase in pain, drainage or any increasing redness or swelling.  Overall she thinks that her right foot is doing better.  She has no other concerns today. Denies any systemic complaints such as fevers, chills, nausea, vomiting. No acute changes since last appointment, and no other complaints at this time.   Objective: AAO x3, NAD Neurovascular status unchanged.  On the plantar aspect of the right foot on the medial midfoot is an ulceration measuring 0.8 x 0.7 x 0.4 cm after debridement.  There is no probing to bone, undermining or tunneling.  There is decrease edema erythema to the right foot.  There is no fluctuation or crepitation there is no malodor. No open lesions or pre-ulcerative lesions.  No pain with calf compression, swelling, warmth, erythema  Assessment: Right foot ulceration with resolving cellulitis  Plan: -All treatment options discussed with the patient including all alternatives, risks, complications.  -I sharply debrided the wound without any complications down to healthy, granular tissue.  Were to switch from using the medihoney dressing to the collagen dressing that she has at home already.  Continue with surgical shoe.  Vernix and antibiotics for 1 more week and I refill this for her today. -Monitor for any clinical signs or symptoms of infection and directed to call the office immediately should any occur or go to the ER. -Patient encouraged to call the office with any questions, concerns, change in symptoms.   Return in about 2 weeks (around 07/13/2018).  [name] DPM

## 2018-07-06 DIAGNOSIS — E1122 Type 2 diabetes mellitus with diabetic chronic kidney disease: Secondary | ICD-10-CM | POA: Diagnosis not present

## 2018-07-06 DIAGNOSIS — N2581 Secondary hyperparathyroidism of renal origin: Secondary | ICD-10-CM | POA: Diagnosis not present

## 2018-07-06 DIAGNOSIS — N186 End stage renal disease: Secondary | ICD-10-CM | POA: Diagnosis not present

## 2018-07-06 DIAGNOSIS — Z992 Dependence on renal dialysis: Secondary | ICD-10-CM | POA: Diagnosis not present

## 2018-07-07 DIAGNOSIS — D509 Iron deficiency anemia, unspecified: Secondary | ICD-10-CM | POA: Diagnosis not present

## 2018-07-07 DIAGNOSIS — E7841 Elevated Lipoprotein(a): Secondary | ICD-10-CM | POA: Diagnosis not present

## 2018-07-07 DIAGNOSIS — N2589 Other disorders resulting from impaired renal tubular function: Secondary | ICD-10-CM | POA: Diagnosis not present

## 2018-07-07 DIAGNOSIS — R82992 Hyperoxaluria: Secondary | ICD-10-CM | POA: Diagnosis not present

## 2018-07-07 DIAGNOSIS — Z79899 Other long term (current) drug therapy: Secondary | ICD-10-CM | POA: Diagnosis not present

## 2018-07-07 DIAGNOSIS — K769 Liver disease, unspecified: Secondary | ICD-10-CM | POA: Diagnosis not present

## 2018-07-07 DIAGNOSIS — D631 Anemia in chronic kidney disease: Secondary | ICD-10-CM | POA: Diagnosis not present

## 2018-07-07 DIAGNOSIS — N186 End stage renal disease: Secondary | ICD-10-CM | POA: Diagnosis not present

## 2018-07-07 DIAGNOSIS — N2581 Secondary hyperparathyroidism of renal origin: Secondary | ICD-10-CM | POA: Diagnosis not present

## 2018-07-08 DIAGNOSIS — N2581 Secondary hyperparathyroidism of renal origin: Secondary | ICD-10-CM | POA: Diagnosis not present

## 2018-07-08 DIAGNOSIS — E878 Other disorders of electrolyte and fluid balance, not elsewhere classified: Secondary | ICD-10-CM | POA: Diagnosis not present

## 2018-07-08 DIAGNOSIS — N186 End stage renal disease: Secondary | ICD-10-CM | POA: Diagnosis not present

## 2018-07-09 DIAGNOSIS — N186 End stage renal disease: Secondary | ICD-10-CM | POA: Diagnosis not present

## 2018-07-09 DIAGNOSIS — Z992 Dependence on renal dialysis: Secondary | ICD-10-CM | POA: Diagnosis not present

## 2018-07-09 DIAGNOSIS — E878 Other disorders of electrolyte and fluid balance, not elsewhere classified: Secondary | ICD-10-CM | POA: Diagnosis not present

## 2018-07-09 DIAGNOSIS — N2581 Secondary hyperparathyroidism of renal origin: Secondary | ICD-10-CM | POA: Diagnosis not present

## 2018-07-10 DIAGNOSIS — N2581 Secondary hyperparathyroidism of renal origin: Secondary | ICD-10-CM | POA: Diagnosis not present

## 2018-07-10 DIAGNOSIS — E878 Other disorders of electrolyte and fluid balance, not elsewhere classified: Secondary | ICD-10-CM | POA: Diagnosis not present

## 2018-07-10 DIAGNOSIS — N186 End stage renal disease: Secondary | ICD-10-CM | POA: Diagnosis not present

## 2018-07-11 DIAGNOSIS — E878 Other disorders of electrolyte and fluid balance, not elsewhere classified: Secondary | ICD-10-CM | POA: Diagnosis not present

## 2018-07-11 DIAGNOSIS — N186 End stage renal disease: Secondary | ICD-10-CM | POA: Diagnosis not present

## 2018-07-11 DIAGNOSIS — N2581 Secondary hyperparathyroidism of renal origin: Secondary | ICD-10-CM | POA: Diagnosis not present

## 2018-07-12 ENCOUNTER — Other Ambulatory Visit: Payer: Self-pay

## 2018-07-12 ENCOUNTER — Ambulatory Visit: Payer: Medicare Other | Admitting: Podiatry

## 2018-07-12 ENCOUNTER — Ambulatory Visit (INDEPENDENT_AMBULATORY_CARE_PROVIDER_SITE_OTHER): Payer: Medicare Other | Admitting: Podiatry

## 2018-07-12 DIAGNOSIS — E1149 Type 2 diabetes mellitus with other diabetic neurological complication: Secondary | ICD-10-CM | POA: Diagnosis not present

## 2018-07-12 DIAGNOSIS — E11621 Type 2 diabetes mellitus with foot ulcer: Secondary | ICD-10-CM

## 2018-07-12 DIAGNOSIS — N2581 Secondary hyperparathyroidism of renal origin: Secondary | ICD-10-CM | POA: Diagnosis not present

## 2018-07-12 DIAGNOSIS — N186 End stage renal disease: Secondary | ICD-10-CM | POA: Diagnosis not present

## 2018-07-12 DIAGNOSIS — L97512 Non-pressure chronic ulcer of 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d: Secondary | ICD-10-CM | POA: Diagnosis not present

## 2018-07-12 DIAGNOSIS — E878 Other disorders of electrolyte and fluid balance, not elsewhere classified: Secondary | ICD-10-CM | POA: Diagnosis not present

## 2018-07-12 DIAGNOSIS — L97529 Non-pressure chronic ulcer of other part of left foot with unspecified severity: Secondary | ICD-10-CM

## 2018-07-13 ENCOUNTER — Telehealth: Payer: Self-pay | Admitting: *Deleted

## 2018-07-13 DIAGNOSIS — E878 Other disorders of electrolyte and fluid balance, not elsewhere classified: Secondary | ICD-10-CM | POA: Diagnosis not present

## 2018-07-13 DIAGNOSIS — N2581 Secondary hyperparathyroidism of renal origin: Secondary | ICD-10-CM | POA: Diagnosis not present

## 2018-07-13 DIAGNOSIS — E1149 Type 2 diabetes mellitus with other diabetic neurological complication: Secondary | ICD-10-CM

## 2018-07-13 DIAGNOSIS — I739 Peripheral vascular disease, unspecified: Secondary | ICD-10-CM

## 2018-07-13 DIAGNOSIS — L03115 Cellulitis of right lower limb: Secondary | ICD-10-CM

## 2018-07-13 DIAGNOSIS — N186 End stage renal disease: Secondary | ICD-10-CM | POA: Diagnosis not present

## 2018-07-13 NOTE — Telephone Encounter (Signed)
Faxed orders to [facility].

## 2018-07-13 NOTE — Telephone Encounter (Signed)
-----   Message from [name], DPM sent at 07/12/2018  3:25 PM EDT ----- Can you order arterial studies due to wound right foot? Thanks.   She would like this done at [facility]

## 2018-07-14 ENCOUNTER — Telehealth: Payer: Self-pay | Admitting: *Deleted

## 2018-07-14 DIAGNOSIS — N2581 Secondary hyperparathyroidism of renal origin: Secondary | ICD-10-CM | POA: Diagnosis not present

## 2018-07-14 DIAGNOSIS — N186 End stage renal disease: Secondary | ICD-10-CM | POA: Diagnosis not present

## 2018-07-14 DIAGNOSIS — E878 Other disorders of electrolyte and fluid balance, not elsewhere classified: Secondary | ICD-10-CM | POA: Diagnosis not present

## 2018-07-14 NOTE — Telephone Encounter (Addendum)
Pt states [name] wanted to know the name of the medication used on her right foot, Simpurity with Alginate and Silver.

## 2018-07-15 ENCOUNTER — Telehealth (HOSPITAL_COMMUNITY): Payer: Self-pay | Admitting: Rehabilitation

## 2018-07-15 DIAGNOSIS — N2581 Secondary hyperparathyroidism of renal origin: Secondary | ICD-10-CM | POA: Diagnosis not present

## 2018-07-15 DIAGNOSIS — N186 End stage renal disease: Secondary | ICD-10-CM | POA: Diagnosis not present

## 2018-07-15 DIAGNOSIS — E878 Other disorders of electrolyte and fluid balance, not elsewhere classified: Secondary | ICD-10-CM | POA: Diagnosis not present

## 2018-07-15 NOTE — Telephone Encounter (Signed)
I informed pt Dr. Berton Lan the [place] with Alginate for dressing changes.

## 2018-07-15 NOTE — Telephone Encounter (Signed)
The above patient or their representative was contacted and gave the following answers to these questions:         Do you have any of the following symptoms? No  Fever                    Cough                   Shortness of breath  Do  you have any of the following other symptoms? No   muscle pain         vomiting,        diarrhea        rash         weakness        red eye        abdominal pain         bruising          bruising or bleeding              joint pain           severe headache    Have you been in contact with someone who was or has been sick in the past 2 weeks? No  Yes                 Unsure                         Unable to assess   Does the person that you were in contact with have any of the following symptoms? No  Cough         shortness of breath           muscle pain         vomiting,            diarrhea            rash            weakness           fever            red eye           abdominal pain           bruising  or  bleeding                joint pain                severe headache               Have you  or someone you have been in contact with traveled internationally in the last month?  No       If yes, which countries?   Have you  or someone you have been in contact with traveled outside [place] in th last month? No        If yes, which state and city?   COMMENTS OR ACTION PLAN FOR THIS PATIENT:

## 2018-07-15 NOTE — Progress Notes (Signed)
Subjective: 73 year old female presents the office today for evaluation of a wound, infection of the right foot.  She has been changing the wound on the right foot daily if she can and she is been applying Medihoney to the wound.  She denies any increase in swelling or redness to the area.  She has no pain.  She also states that she cut her left big toenail presents cutting into her second toe causing irritation the wound.  Denies any drainage or pus coming from that area. Denies any systemic complaints such as fevers, chills, nausea, vomiting. No acute changes since last appointment, and no other complaints at this time.   Objective: AAO x3, NAD Neurovascular status unchanged.  On the plantar aspect of the right foot on the medial midfoot is an ulceration measuring 0.8 x 0.6 x 0.4 cm after debridement.  Overall the wound is about the same size.  There is no surrounding erythema, ascending cellulitis.  No fluctuation crepitation any malodor.  There is also a hyperkeratotic tissue with a central superficial wound present on the medial aspect of the left second toe.  This along the IPJ.  There is no surrounding edema, erythema or increase in warmth.  No fluctuation crepitation any malodor.  There is no other open lesions identified.  No open lesions or pre-ulcerative lesions.  No pain with calf compression, swelling, warmth, erythema  Assessment: Right foot ulceration with resolving cellulitis; left second toe wound  Plan: -All treatment options discussed with the patient including all alternatives, risks, complications.  -Sharp injury to the wound on the right side without any complications or bleeding utilized #312 blade scalpel down to healthy, granular tissue.  She has multiple dressings at home and she brought with her.  She is in a contact me when she gets home for me know what she has.  It sounds like collagen dressing.  Continue offloading at all times. -I debrided the wound, callus of the left  second toe with any complications.  Continue medihoney on this wound daily. -Monitor for any clinical signs or symptoms of infection and directed to call the office immediately should any occur or go to the ER.  RTC 2 weeks or sooner if needed  [name] DPM

## 2018-07-15 NOTE — Telephone Encounter (Signed)
She can use the alginate silver dressing that she has. Please let her know. Thanks.

## 2018-07-16 DIAGNOSIS — N186 End stage renal disease: Secondary | ICD-10-CM | POA: Diagnosis not present

## 2018-07-16 DIAGNOSIS — E878 Other disorders of electrolyte and fluid balance, not elsewhere classified: Secondary | ICD-10-CM | POA: Diagnosis not present

## 2018-07-16 DIAGNOSIS — N2581 Secondary hyperparathyroidism of renal origin: Secondary | ICD-10-CM | POA: Diagnosis not present

## 2018-07-17 DIAGNOSIS — E878 Other disorders of electrolyte and fluid balance, not elsewhere classified: Secondary | ICD-10-CM | POA: Diagnosis not present

## 2018-07-17 DIAGNOSIS — N186 End stage renal disease: Secondary | ICD-10-CM | POA: Diagnosis not present

## 2018-07-17 DIAGNOSIS — N2581 Secondary hyperparathyroidism of renal origin: Secondary | ICD-10-CM | POA: Diagnosis not present

## 2018-07-18 DIAGNOSIS — N186 End stage renal disease: Secondary | ICD-10-CM | POA: Diagnosis not present

## 2018-07-18 DIAGNOSIS — N2581 Secondary hyperparathyroidism of renal origin: Secondary | ICD-10-CM | POA: Diagnosis not present

## 2018-07-18 DIAGNOSIS — E878 Other disorders of electrolyte and fluid balance, not elsewhere classified: Secondary | ICD-10-CM | POA: Diagnosis not present

## 2018-07-19 ENCOUNTER — Ambulatory Visit (INDEPENDENT_AMBULATORY_CARE_PROVIDER_SITE_OTHER)
Admission: RE | Admit: 2018-07-19 | Discharge: 2018-07-19 | Disposition: A | Discharge status: Home / Self Care | Payer: Medicare Other | Source: Ambulatory Visit | Attending: Family | Admitting: Family

## 2018-07-19 ENCOUNTER — Ambulatory Visit (HOSPITAL_COMMUNITY)
Admission: RE | Admit: 2018-07-19 | Discharge: 2018-07-19 | Disposition: A | Discharge status: Home / Self Care | Payer: Medicare Other | Source: Ambulatory Visit | Attending: Family | Admitting: Family

## 2018-07-19 ENCOUNTER — Other Ambulatory Visit: Payer: Self-pay

## 2018-07-19 DIAGNOSIS — E878 Other disorders of electrolyte and fluid balance, not elsewhere classified: Secondary | ICD-10-CM | POA: Diagnosis not present

## 2018-07-19 DIAGNOSIS — N186 End stage renal disease: Secondary | ICD-10-CM | POA: Diagnosis not present

## 2018-07-19 DIAGNOSIS — I739 Peripheral vascular disease, unspecified: Secondary | ICD-10-CM

## 2018-07-19 DIAGNOSIS — E1149 Type 2 diabetes mellitus with other diabetic neurological complication: Secondary | ICD-10-CM

## 2018-07-19 DIAGNOSIS — N2581 Secondary hyperparathyroidism of renal origin: Secondary | ICD-10-CM | POA: Diagnosis not present

## 2018-07-19 DIAGNOSIS — L03115 Cellulitis of right lower limb: Secondary | ICD-10-CM | POA: Insufficient documentation

## 2018-07-20 ENCOUNTER — Telehealth: Payer: Self-pay | Admitting: *Deleted

## 2018-07-20 DIAGNOSIS — E878 Other disorders of electrolyte and fluid balance, not elsewhere classified: Secondary | ICD-10-CM | POA: Diagnosis not present

## 2018-07-20 DIAGNOSIS — N2581 Secondary hyperparathyroidism of renal origin: Secondary | ICD-10-CM | POA: Diagnosis not present

## 2018-07-20 DIAGNOSIS — I739 Peripheral vascular disease, unspecified: Secondary | ICD-10-CM

## 2018-07-20 DIAGNOSIS — N186 End stage renal disease: Secondary | ICD-10-CM | POA: Diagnosis not present

## 2018-07-20 DIAGNOSIS — L03115 Cellulitis of right lower limb: Secondary | ICD-10-CM

## 2018-07-20 DIAGNOSIS — E1149 Type 2 diabetes mellitus with other diabetic neurological complication: Secondary | ICD-10-CM

## 2018-07-20 DIAGNOSIS — L97512 Non-pressure chronic ulcer of 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d: Secondary | ICD-10-CM

## 2018-07-20 NOTE — Telephone Encounter (Signed)
Urgent referral to [facility] - Dr. Donnetta Hutching for PAD and right foot wound, required form with clinical and demographics faxed.

## 2018-07-20 NOTE — Telephone Encounter (Signed)
-----   Message from [name], DPM sent at 07/19/2018  4:16 PM EDT ----- Tivis Ringer- Can you get her in to see Dr. Donnetta Hutching or [facility] for PAD and wound on the right foot. Please let her know the she has significant decrease in circulation.

## 2018-07-21 DIAGNOSIS — E878 Other disorders of electrolyte and fluid balance, not elsewhere classified: Secondary | ICD-10-CM | POA: Diagnosis not present

## 2018-07-21 DIAGNOSIS — N2581 Secondary hyperparathyroidism of renal origin: Secondary | ICD-10-CM | POA: Diagnosis not present

## 2018-07-21 DIAGNOSIS — N186 End stage renal disease: Secondary | ICD-10-CM | POA: Diagnosis not present

## 2018-07-22 DIAGNOSIS — N2581 Secondary hyperparathyroidism of renal origin: Secondary | ICD-10-CM | POA: Diagnosis not present

## 2018-07-22 DIAGNOSIS — E878 Other disorders of electrolyte and fluid balance, not elsewhere classified: Secondary | ICD-10-CM | POA: Diagnosis not present

## 2018-07-22 DIAGNOSIS — N186 End stage renal disease: Secondary | ICD-10-CM | POA: Diagnosis not present

## 2018-07-23 ENCOUNTER — Encounter: Payer: Self-pay | Admitting: Podiatry

## 2018-07-23 ENCOUNTER — Ambulatory Visit (INDEPENDENT_AMBULATORY_CARE_PROVIDER_SITE_OTHER): Payer: Medicare Other | Admitting: Podiatry

## 2018-07-23 VITALS — Temp 97.9°F

## 2018-07-23 DIAGNOSIS — I739 Peripheral vascular disease, unspecified: Secondary | ICD-10-CM | POA: Diagnosis not present

## 2018-07-23 DIAGNOSIS — N2581 Secondary hyperparathyroidism of renal origin: Secondary | ICD-10-CM | POA: Diagnosis not present

## 2018-07-23 DIAGNOSIS — L97512 Non-pressure chronic ulcer of 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d: Secondary | ICD-10-CM

## 2018-07-23 DIAGNOSIS — E1149 Type 2 diabetes mellitus with other diabetic neurological complication: Secondary | ICD-10-CM | POA: Diagnosis not present

## 2018-07-23 DIAGNOSIS — N186 End stage renal disease: Secondary | ICD-10-CM | POA: Diagnosis not present

## 2018-07-23 DIAGNOSIS — E878 Other disorders of electrolyte and fluid balance, not elsewhere classified: Secondary | ICD-10-CM | POA: Diagnosis not present

## 2018-07-24 DIAGNOSIS — N186 End stage renal disease: Secondary | ICD-10-CM | POA: Diagnosis not present

## 2018-07-24 DIAGNOSIS — E878 Other disorders of electrolyte and fluid balance, not elsewhere classified: Secondary | ICD-10-CM | POA: Diagnosis not present

## 2018-07-24 DIAGNOSIS — N2581 Secondary hyperparathyroidism of renal origin: Secondary | ICD-10-CM | POA: Diagnosis not present

## 2018-07-25 DIAGNOSIS — N2581 Secondary hyperparathyroidism of renal origin: Secondary | ICD-10-CM | POA: Diagnosis not present

## 2018-07-25 DIAGNOSIS — E878 Other disorders of electrolyte and fluid balance, not elsewhere classified: Secondary | ICD-10-CM | POA: Diagnosis not present

## 2018-07-25 DIAGNOSIS — N186 End stage renal disease: Secondary | ICD-10-CM | POA: Diagnosis not present

## 2018-07-26 ENCOUNTER — Telehealth (HOSPITAL_COMMUNITY): Payer: Self-pay | Admitting: Rehabilitation

## 2018-07-26 DIAGNOSIS — N186 End stage renal disease: Secondary | ICD-10-CM | POA: Diagnosis not present

## 2018-07-26 DIAGNOSIS — E878 Other disorders of electrolyte and fluid balance, not elsewhere classified: Secondary | ICD-10-CM | POA: Diagnosis not present

## 2018-07-26 DIAGNOSIS — N2581 Secondary hyperparathyroidism of renal origin: Secondary | ICD-10-CM | POA: Diagnosis not present

## 2018-07-26 NOTE — Telephone Encounter (Signed)
The above patient or their representative was contacted and gave the following answers to these questions:         Do you have any of the following symptoms? No  Fever                    Cough                   Shortness of breath  Do  you have any of the following other symptoms? No   muscle pain         vomiting,        diarrhea        rash         weakness        red eye        abdominal pain         bruising          bruising or bleeding              joint pain           severe headache    Have you been in contact with someone who was or has been sick in the past 2 weeks? No  Yes                 Unsure                         Unable to assess   Does the person that you were in contact with have any of the following symptoms?   Cough         shortness of breath           muscle pain         vomiting,            diarrhea            rash            weakness           fever            red eye           abdominal pain           bruising  or  bleeding                joint pain                severe headache               Have you  or someone you have been in contact with traveled internationally in th last month? No        If yes, which countries?   Have you  or someone you have been in contact with traveled outside [place] in th last month? No         If yes, which state and city?   COMMENTS OR ACTION PLAN FOR THIS PATIENT:          

## 2018-07-27 ENCOUNTER — Ambulatory Visit (INDEPENDENT_AMBULATORY_CARE_PROVIDER_SITE_OTHER): Payer: Medicare Other | Admitting: Vascular Surgery

## 2018-07-27 ENCOUNTER — Encounter (HOSPITAL_COMMUNITY): Payer: Medicare Other

## 2018-07-27 ENCOUNTER — Other Ambulatory Visit: Payer: Self-pay

## 2018-07-27 ENCOUNTER — Encounter: Payer: Self-pay | Admitting: Vascular Surgery

## 2018-07-27 VITALS — BP 148/68 | HR 88 | Temp 97.8°F | Resp 20 | Ht 65.0 in | Wt 165.0 lb

## 2018-07-27 DIAGNOSIS — I739 Peripheral vascular disease, unspecified: Secondary | ICD-10-CM | POA: Diagnosis not present

## 2018-07-27 DIAGNOSIS — N186 End stage renal disease: Secondary | ICD-10-CM | POA: Diagnosis not present

## 2018-07-27 DIAGNOSIS — I25810 Atherosclerosis of coronary artery bypass graft(s) without angina pectoris: Secondary | ICD-10-CM | POA: Diagnosis not present

## 2018-07-27 DIAGNOSIS — E878 Other disorders of electrolyte and fluid balance, not elsewhere classified: Secondary | ICD-10-CM | POA: Diagnosis not present

## 2018-07-27 DIAGNOSIS — N2581 Secondary hyperparathyroidism of renal origin: Secondary | ICD-10-CM | POA: Diagnosis not present

## 2018-07-27 NOTE — Progress Notes (Signed)
[facility] [place]  Patient name: [name] MRN: [id number] DOB: [birth year] Sex: female  REASON FOR VISIT: Evaluate arterial flow with ulcer on right foot  HPI: [name] is a 73 y.o. female here today for evaluation.  She is well-known to me from prior right radiocephalic AV fistula creation.  She currently is undergoing peritoneal dialysis and is not using her fistula.  She reports that she is somewhat frustrated regarding the time commitment regarding new initiation of her peritoneal dialysis but is improving.  She has had a long history of foot issues.  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  She now has ulceration on the plantar aspect of her right foot due to "fallen arches".  She is seeing [name] who is treating this with debridement and topical care.  I have reviewed his visit and his most recent visit with her on 413 suggest improvement.  He does not have any pain associated with this and does not walk to the level of claudication.  Does have history of prior coronary artery bypass grafting.  Past Medical History:  Diagnosis Date  . Allergy   . Arthritis    "maybe in my lower back" (08/27/2012)  . 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 Chronic combined systolic and diastolic CHF (congestive heart failure) ([place])   . CKD (chronic kidney disease), stage IV ([place])   . Coronary artery disease    a. NSTEMI in 05/2009 s/p CABG (LIMA-LAD, SVG-diagonal, SVG-OM1/OM 2).   Marland Kitchen Dyspnea    with exertion  . Family history of anesthesia complication    Mom has a hard time to wake up  . Foot ulcer ([place])    "I've had them on both feet" (08/27/2012)  . Gouty arthritis    "right index finger" (08/27/2012)  . History of radiation therapy 06/09/13-07/06/13   left breast 50Gy  . Hyperlipidemia    . Hypertension   . Infection    right second toe  . Ischemic cardiomyopathy    a. 2011: EF 40-45%. b. EF 55-60% in 04/2017 but shortly after as inpatient was 45-50%.  Marland Kitchen LBBB (left bundle branch block)   . Mitral regurgitation   . Neuropathy    Hx; of B/L feet  . NSTEMI (non-ST elevated myocardial infarction) ([place]) 05/23/2009  . Osteomyelitis of foot ([place])   . PAD (peripheral artery disease) ([facility])    a. LE PAD (patient previously elected hold off L fem-pop), prev followed by Dr. Bridgett Larsson  . PONV (postoperative nausea and vomiting)   . Sinus headache    "occasionally" (08/27/2012)  . Type II diabetes mellitus ([place])   . Vitamin D deficiency     Family History  Problem Relation Age of Onset  . Kidney cancer Brother 33  . Hypertension Brother   . Breast cancer Sister 61       LCIS; BRCA negative  . Throat cancer Father 74       smoker  . Breast cancer Sister 60       Lobular breast cancer  . Breast cancer Maternal Aunt        dx in her 37s    SOCIAL HISTORY: Social History   Tobacco Use  . Smoking status: Former Research scientist (life sciences)  . Smokeless tobacco: Never Used  Substance Use Topics  . Alcohol use: Yes  Comment: rarely drinks wine    Allergies  Allergen Reactions  . Atenolol Other (See Comments)    Exacerbates gout Exacerbates gout  . Adhesive [Tape] Other (See Comments)    SKIN IS VERY SENSITIVE AND BRUISES AND TEARS EASILY; PLEASE USE AN ALTERNATIVE TO TAPE!!  . Contrast Media [Iodinated Diagnostic Agents] Rash  . Latex Rash  . Omnipaque [Iohexol] Rash    Current Outpatient Medications  Medication Sig Dispense Refill  . acetaminophen (TYLENOL) 500 MG tablet Take 1,000 mg by mouth every 6 (six) hours as needed for mild pain or headache.     . allopurinol (ZYLOPRIM) 100 MG tablet TAKE 1 TABLET TWICE DAILY 180 tablet 1  . aspirin EC 81 MG tablet Take 1 tablet (81 mg total) by mouth daily.    . Aspirin-Acetaminophen-Caffeine (EXCEDRIN EXTRA STRENGTH PO) Take by mouth as  needed.    Marland Kitchen b complex-vitamin c-folic acid (NEPHRO-VITE) 0.8 MG TABS tablet TAKE 1 TABLET BY MOUTH EVERY DAY (ON DIALYSIS DAYS, TAKE AFTER DIALYSIS TREATMENT)    . BD VEO INSULIN SYRINGE U/F 31G X 15/64" 1 ML MISC USE AS DIRECTED WITH INSULIN 300 each 3  . docusate sodium (COLACE) 100 MG capsule Take 100 mg by mouth at bedtime.    Marland Kitchen ethyl chloride spray SPRAY 3 TIMES PER WEEK    . fluticasone (FLONASE) 50 MCG/ACT nasal spray Place 2 sprays into both nostrils daily as needed for allergies or rhinitis.    Marland Kitchen glucose blood (PRODIGY NO CODING BLOOD GLUC) test strip CHECK SUGARS 3 TIMES A DAY.[id number] 300 each 3  . hydrALAZINE (APRESOLINE) 25 MG tablet TAKE 3 TABLETS THREE TIMES DAILY 810 tablet 1  . insulin NPH-regular Human (70-30) 100 UNIT/ML injection Inject 10-30 Units into the skin 2 (two) times daily with a meal.    . isosorbide dinitrate (ISORDIL) 10 MG tablet TAKE 1 TABLET THREE TIMES DAILY 270 tablet 1  . Multiple Vitamins-Minerals (OCUVITE EYE HEALTH FORMULA PO) Take 1 tablet by mouth at bedtime.     Vladimir Faster Glycol-Propyl Glycol (LUBRICANT EYE DROPS) 0.4-0.3 % SOLN Place 1-2 drops into both eyes 3 (three) times daily as needed (for dry eyes.).    Marland Kitchen psyllium (METAMUCIL) 58.6 % powder Take 1 packet by mouth 3 (three) times daily.    . sevelamer carbonate (RENVELA) 800 MG tablet TAKE 2 TABLETS BY MOUTH THREE TIMES DAILY WITH MEALS    . Cholecalciferol (VITAMIN D3) 2000 units TABS Take 4,000 Units by mouth daily.      No current facility-administered medications for this visit.     REVIEW OF SYSTEMS:  [X] denotes positive finding, [ ] denotes negative finding Cardiac  Comments:  Chest pain or chest pressure:    Shortness of breath upon exertion:    Short of breath when lying flat:    Irregular heart rhythm:        Vascular    Pain in calf, thigh, or hip brought on by ambulation:    Pain in feet at night that wakes you up from your sleep:     Blood clot in your veins:    Leg  swelling:           PHYSICAL EXAM: Vitals:   07/27/18 1250  BP: (!) 148/68  Pulse: 88  Resp: 20  Temp: 97.8 F (36.6 C)  SpO2: 99%  Weight: 165 lb (74.8 kg)  Height: 5' 5" (1.651 m)    GENERAL: The patient is a well-nourished female, in no acute  distress. The vital signs are documented above. CARDIOVASCULAR: Palpable radial pulses bilaterally.  Well developed right radiocephalic fistula.  Palpable femoral pulses bilaterally.  Absent popliteal and distal pulses bilaterally PULMONARY: There is good air exchange  MUSCULOSKELETAL: There are no major deformities or cyanosis. NEUROLOGIC: No focal weakness or paresthesias are detected. SKIN: Callus formation in the arch of her right foot with no surrounding erythema PSYCHIATRIC: The patient has a normal affect.  DATA:  Noninvasive studies reveal calcification of her vessels bilaterally therefore ankle arm index is not reliable.  Duplex imaging shows occlusion of her superficial femoral arteries bilaterally  MEDICAL ISSUES: Chronic lower extremity peripheral vascular occlusive disease with superficial femoral artery occlusion.  She is having improvement in the ulceration on the arch of her right foot.  I certainly would not recommend any aggressive treatment currently such as endovascular treatment or bypass.  She is having close follow-up with Dr.Wagoner.  I would recommend arteriography and further evaluation if she has any negative changes in her wound.  Feel that she has very high likelihood that this will heal without revascularization.  She was encouraged with this discussion and will see [name] on an as-needed basis.    Pamela Posner, MD FACS [facility] Tel [phone] Pager [phone]

## 2018-07-27 NOTE — Progress Notes (Signed)
Subjective: 73 year old female presents the office today for evaluation of a wound, infection of the right foot.  She says overall she has been doing better and she feels the wound is been getting better as well.  She has been keeping daily dressing changes and she is been using the collagen dressing.  She can try several months possible.  She denies any drainage or pus coming from the wound she denies any increase in swelling or redness.  The wound on the left second toe is doing well. No acute changes since last appointment, and no other complaints at this time.   Objective: AAO x3, NAD Neurovascular status unchanged.  On the plantar aspect of the right foot on the medial midfoot is an ulceration measuring 0.5 x 0.6 x 0.3 cm after debridement.  Prior to debridement there is a hyperkeratotic tissue overlying the wound would not appear to be healed but upon debridement there is wound still present.  There is no probing to bone, undermining or tunneling.  There is no surrounding erythema, ascending cellulitis.  No fluctuation crepitation any malodor.  No clinical signs of infection today.  The wound in the left second toe appears to be healed. No open lesions or pre-ulcerative lesions.  No pain with calf compression, swelling, warmth, erythema  Assessment: Right foot ulceration with resolved cellulitis; left second toe wound healing  Plan: -All treatment options discussed with the patient including all alternatives, risks, complications.  -Sharp injury to the wound on the right side without any complications or bleeding utilized #312 blade scalpel down to healthy, granular tissue.  Continue with collagen silver dressing changes. -Continue offloading at all times to both sides. Keep a small amount of antibiotic ointment on the left 2nd toe daily. -She has a follow-up appointment with Dr. Donnetta Hutching to assess circulation -Monitor for any clinical signs or symptoms of infection and directed to call the  office immediately should any occur or go to the ER.  [name] DPM

## 2018-07-28 DIAGNOSIS — N2581 Secondary hyperparathyroidism of renal origin: Secondary | ICD-10-CM | POA: Diagnosis not present

## 2018-07-28 DIAGNOSIS — E878 Other disorders of electrolyte and fluid balance, not elsewhere classified: Secondary | ICD-10-CM | POA: Diagnosis not present

## 2018-07-28 DIAGNOSIS — N186 End stage renal disease: Secondary | ICD-10-CM | POA: Diagnosis not present

## 2018-07-29 DIAGNOSIS — N2581 Secondary hyperparathyroidism of renal origin: Secondary | ICD-10-CM | POA: Diagnosis not present

## 2018-07-29 DIAGNOSIS — N186 End stage renal disease: Secondary | ICD-10-CM | POA: Diagnosis not present

## 2018-07-29 DIAGNOSIS — E878 Other disorders of electrolyte and fluid balance, not elsewhere classified: Secondary | ICD-10-CM | POA: Diagnosis not present

## 2018-07-30 DIAGNOSIS — N2581 Secondary hyperparathyroidism of renal origin: Secondary | ICD-10-CM | POA: Diagnosis not present

## 2018-07-30 DIAGNOSIS — N186 End stage renal disease: Secondary | ICD-10-CM | POA: Diagnosis not present

## 2018-07-30 DIAGNOSIS — E878 Other disorders of electrolyte and fluid balance, not elsewhere classified: Secondary | ICD-10-CM | POA: Diagnosis not present

## 2018-07-31 DIAGNOSIS — N2581 Secondary hyperparathyroidism of renal origin: Secondary | ICD-10-CM | POA: Diagnosis not present

## 2018-07-31 DIAGNOSIS — E878 Other disorders of electrolyte and fluid balance, not elsewhere classified: Secondary | ICD-10-CM | POA: Diagnosis not present

## 2018-07-31 DIAGNOSIS — N186 End stage renal disease: Secondary | ICD-10-CM | POA: Diagnosis not present

## 2018-08-01 DIAGNOSIS — N2581 Secondary hyperparathyroidism of renal origin: Secondary | ICD-10-CM | POA: Diagnosis not present

## 2018-08-01 DIAGNOSIS — N186 End stage renal disease: Secondary | ICD-10-CM | POA: Diagnosis not present

## 2018-08-01 DIAGNOSIS — E878 Other disorders of electrolyte and fluid balance, not elsewhere classified: Secondary | ICD-10-CM | POA: Diagnosis not present

## 2018-08-02 ENCOUNTER — Other Ambulatory Visit: Payer: Self-pay

## 2018-08-02 ENCOUNTER — Ambulatory Visit (INDEPENDENT_AMBULATORY_CARE_PROVIDER_SITE_OTHER): Payer: Medicare Other | Admitting: Podiatry

## 2018-08-02 ENCOUNTER — Encounter: Payer: Self-pay | Admitting: Podiatry

## 2018-08-02 VITALS — Temp 98.1°F

## 2018-08-02 DIAGNOSIS — E1149 Type 2 diabetes mellitus with other diabetic neurological complication: Secondary | ICD-10-CM | POA: Diagnosis not present

## 2018-08-02 DIAGNOSIS — E878 Other disorders of electrolyte and fluid balance, not elsewhere classified: Secondary | ICD-10-CM | POA: Diagnosis not present

## 2018-08-02 DIAGNOSIS — L97512 Non-pressure chronic ulcer of 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d: Secondary | ICD-10-CM | POA: Diagnosis not present

## 2018-08-02 DIAGNOSIS — N186 End stage renal disease: Secondary | ICD-10-CM | POA: Diagnosis not present

## 2018-08-02 DIAGNOSIS — L84 Corns and callosities: Secondary | ICD-10-CM

## 2018-08-02 DIAGNOSIS — N2581 Secondary hyperparathyroidism of renal origin: Secondary | ICD-10-CM | POA: Diagnosis not present

## 2018-08-03 ENCOUNTER — Ambulatory Visit: Payer: Medicare Other | Admitting: Podiatry

## 2018-08-03 DIAGNOSIS — N2581 Secondary hyperparathyroidism of renal origin: Secondary | ICD-10-CM | POA: Diagnosis not present

## 2018-08-03 DIAGNOSIS — E878 Other disorders of electrolyte and fluid balance, not elsewhere classified: Secondary | ICD-10-CM | POA: Diagnosis not present

## 2018-08-03 DIAGNOSIS — N186 End stage renal disease: Secondary | ICD-10-CM | POA: Diagnosis not present

## 2018-08-03 NOTE — Progress Notes (Signed)
Subjective: 73 year old female presents the office today for evaluation of a wound, infection of the right foot.  She does feel that the wound is getting smaller.  She has been using the iodosorb as well as the collagen dressing.  She denies any drainage or pus or any redness or swelling to the foot.  She has no other concerns today.  She denies any fevers, chills, nausea, vomiting.  No calf pain, chest pain, shortness of breath  Objective: AAO x3, NAD Neurovascular status unchanged.   On the plantar aspect of the right foot on the medial midfoot is an ulceration measuring 0.4 x 0.3 x 0.3 cm after debridement.  The pre-and post wound measurements are the same.  There is no probing to bone, undermining or tunneling.  There is no surrounding erythema, ascending cellulitis.  No fluctuation crepitation any malodor.  No clinical signs of infection today.   The wound in the left second toe appears to be healed and there are no further ulcerations identified at this time.  There is a pre-ulcerative callus submetatarsal 1 the left foot. No open lesions or pre-ulcerative lesions.  No pain with calf compression, swelling, warmth, erythema  Assessment: Right foot ulceration with resolved cellulitis; left second toe wound which is healed; pre-ulcerative callus left foot  Plan: -All treatment options discussed with the patient including all alternatives, risks, complications.  -Sharply debrided the wound on the right side without any complications or bleeding utilized #312 blade scalpel down to healthy, granular tissue.  Continue with collagen silver dressing changes. -I debrided the pre-ulcerative callus on the left foot submetatarsal.  Recommend moisturizer to this area daily and monitor for any skin breakdown. -Monitor for any clinical signs or symptoms of infection and directed to call the office immediately should any occur or go to the ER.  [name] DPM

## 2018-08-04 DIAGNOSIS — N186 End stage renal disease: Secondary | ICD-10-CM | POA: Diagnosis not present

## 2018-08-04 DIAGNOSIS — E878 Other disorders of electrolyte and fluid balance, not elsewhere classified: Secondary | ICD-10-CM | POA: Diagnosis not present

## 2018-08-04 DIAGNOSIS — N2581 Secondary hyperparathyroidism of renal origin: Secondary | ICD-10-CM | POA: Diagnosis not present

## 2018-08-05 DIAGNOSIS — E878 Other disorders of electrolyte and fluid balance, not elsewhere classified: Secondary | ICD-10-CM | POA: Diagnosis not present

## 2018-08-05 DIAGNOSIS — Z992 Dependence on renal dialysis: Secondary | ICD-10-CM | POA: Diagnosis not present

## 2018-08-05 DIAGNOSIS — N2581 Secondary hyperparathyroidism of renal origin: Secondary | ICD-10-CM | POA: Diagnosis not present

## 2018-08-05 DIAGNOSIS — E1122 Type 2 diabetes mellitus with diabetic chronic kidney disease: Secondary | ICD-10-CM | POA: Diagnosis not present

## 2018-08-05 DIAGNOSIS — N186 End stage renal disease: Secondary | ICD-10-CM | POA: Diagnosis not present

## 2018-08-06 DIAGNOSIS — D509 Iron deficiency anemia, unspecified: Secondary | ICD-10-CM | POA: Diagnosis not present

## 2018-08-06 DIAGNOSIS — Z992 Dependence on renal dialysis: Secondary | ICD-10-CM | POA: Diagnosis not present

## 2018-08-06 DIAGNOSIS — Z4932 Encounter for adequacy testing for peritoneal dialysis: Secondary | ICD-10-CM | POA: Diagnosis not present

## 2018-08-06 DIAGNOSIS — N2581 Secondary hyperparathyroidism of renal origin: Secondary | ICD-10-CM | POA: Diagnosis not present

## 2018-08-06 DIAGNOSIS — D631 Anemia in chronic kidney disease: Secondary | ICD-10-CM | POA: Diagnosis not present

## 2018-08-06 DIAGNOSIS — E441 Mild protein-calorie malnutrition: Secondary | ICD-10-CM | POA: Diagnosis not present

## 2018-08-06 DIAGNOSIS — E878 Other disorders of electrolyte and fluid balance, not elsewhere classified: Secondary | ICD-10-CM | POA: Diagnosis not present

## 2018-08-06 DIAGNOSIS — N186 End stage renal disease: Secondary | ICD-10-CM | POA: Diagnosis not present

## 2018-08-06 DIAGNOSIS — E1122 Type 2 diabetes mellitus with diabetic chronic kidney disease: Secondary | ICD-10-CM | POA: Diagnosis not present

## 2018-08-06 DIAGNOSIS — K769 Liver disease, unspecified: Secondary | ICD-10-CM | POA: Diagnosis not present

## 2018-08-06 DIAGNOSIS — Z79899 Other long term (current) drug therapy: Secondary | ICD-10-CM | POA: Diagnosis not present

## 2018-08-07 DIAGNOSIS — N186 End stage renal disease: Secondary | ICD-10-CM | POA: Diagnosis not present

## 2018-08-07 DIAGNOSIS — K769 Liver disease, unspecified: Secondary | ICD-10-CM | POA: Diagnosis not present

## 2018-08-07 DIAGNOSIS — Z79899 Other long term (current) drug therapy: Secondary | ICD-10-CM | POA: Diagnosis not present

## 2018-08-07 DIAGNOSIS — E878 Other disorders of electrolyte and fluid balance, not elsewhere classified: Secondary | ICD-10-CM | POA: Diagnosis not present

## 2018-08-07 DIAGNOSIS — D631 Anemia in chronic kidney disease: Secondary | ICD-10-CM | POA: Diagnosis not present

## 2018-08-08 DIAGNOSIS — N186 End stage renal disease: Secondary | ICD-10-CM | POA: Diagnosis not present

## 2018-08-08 DIAGNOSIS — D631 Anemia in chronic kidney disease: Secondary | ICD-10-CM | POA: Diagnosis not present

## 2018-08-08 DIAGNOSIS — E878 Other disorders of electrolyte and fluid balance, not elsewhere classified: Secondary | ICD-10-CM | POA: Diagnosis not present

## 2018-08-08 DIAGNOSIS — R82992 Hyperoxaluria: Secondary | ICD-10-CM | POA: Diagnosis not present

## 2018-08-08 DIAGNOSIS — Z79899 Other long term (current) drug therapy: Secondary | ICD-10-CM | POA: Diagnosis not present

## 2018-08-08 DIAGNOSIS — K769 Liver disease, unspecified: Secondary | ICD-10-CM | POA: Diagnosis not present

## 2018-08-09 DIAGNOSIS — E878 Other disorders of electrolyte and fluid balance, not elsewhere classified: Secondary | ICD-10-CM | POA: Diagnosis not present

## 2018-08-09 DIAGNOSIS — K769 Liver disease, unspecified: Secondary | ICD-10-CM | POA: Diagnosis not present

## 2018-08-09 DIAGNOSIS — Z79899 Other long term (current) drug therapy: Secondary | ICD-10-CM | POA: Diagnosis not present

## 2018-08-09 DIAGNOSIS — D631 Anemia in chronic kidney disease: Secondary | ICD-10-CM | POA: Diagnosis not present

## 2018-08-09 DIAGNOSIS — N186 End stage renal disease: Secondary | ICD-10-CM | POA: Diagnosis not present

## 2018-08-10 DIAGNOSIS — N186 End stage renal disease: Secondary | ICD-10-CM | POA: Diagnosis not present

## 2018-08-10 DIAGNOSIS — K769 Liver disease, unspecified: Secondary | ICD-10-CM | POA: Diagnosis not present

## 2018-08-10 DIAGNOSIS — E878 Other disorders of electrolyte and fluid balance, not elsewhere classified: Secondary | ICD-10-CM | POA: Diagnosis not present

## 2018-08-10 DIAGNOSIS — D631 Anemia in chronic kidney disease: Secondary | ICD-10-CM | POA: Diagnosis not present

## 2018-08-10 DIAGNOSIS — Z79899 Other long term (current) drug therapy: Secondary | ICD-10-CM | POA: Diagnosis not present

## 2018-08-11 DIAGNOSIS — E878 Other disorders of electrolyte and fluid balance, not elsewhere classified: Secondary | ICD-10-CM | POA: Diagnosis not present

## 2018-08-11 DIAGNOSIS — K769 Liver disease, unspecified: Secondary | ICD-10-CM | POA: Diagnosis not present

## 2018-08-11 DIAGNOSIS — Z79899 Other long term (current) drug therapy: Secondary | ICD-10-CM | POA: Diagnosis not present

## 2018-08-11 DIAGNOSIS — N186 End stage renal disease: Secondary | ICD-10-CM | POA: Diagnosis not present

## 2018-08-11 DIAGNOSIS — D631 Anemia in chronic kidney disease: Secondary | ICD-10-CM | POA: Diagnosis not present

## 2018-08-12 ENCOUNTER — Ambulatory Visit (INDEPENDENT_AMBULATORY_CARE_PROVIDER_SITE_OTHER): Payer: Medicare Other | Admitting: Podiatry

## 2018-08-12 ENCOUNTER — Other Ambulatory Visit: Payer: Self-pay

## 2018-08-12 ENCOUNTER — Encounter: Payer: Self-pay | Admitting: Podiatry

## 2018-08-12 VITALS — Temp 97.3°F

## 2018-08-12 DIAGNOSIS — L97529 Non-pressure chronic ulcer of other part of left foot with unspecified severity: Secondary | ICD-10-CM

## 2018-08-12 DIAGNOSIS — E1149 Type 2 diabetes mellitus with other diabetic neurological complication: Secondary | ICD-10-CM

## 2018-08-12 DIAGNOSIS — I739 Peripheral vascular disease, unspecified: Secondary | ICD-10-CM

## 2018-08-12 DIAGNOSIS — L97512 Non-pressure chronic ulcer of 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d: Secondary | ICD-10-CM | POA: Diagnosis not present

## 2018-08-12 DIAGNOSIS — N186 End stage renal disease: Secondary | ICD-10-CM | POA: Diagnosis not present

## 2018-08-12 DIAGNOSIS — K769 Liver disease, unspecified: Secondary | ICD-10-CM | POA: Diagnosis not present

## 2018-08-12 DIAGNOSIS — E878 Other disorders of electrolyte and fluid balance, not elsewhere classified: Secondary | ICD-10-CM | POA: Diagnosis not present

## 2018-08-12 DIAGNOSIS — E11621 Type 2 diabetes mellitus with foot ulcer: Secondary | ICD-10-CM | POA: Diagnosis not present

## 2018-08-12 DIAGNOSIS — Z79899 Other long term (current) drug therapy: Secondary | ICD-10-CM | POA: Diagnosis not present

## 2018-08-12 DIAGNOSIS — D631 Anemia in chronic kidney disease: Secondary | ICD-10-CM | POA: Diagnosis not present

## 2018-08-13 DIAGNOSIS — E878 Other disorders of electrolyte and fluid balance, not elsewhere classified: Secondary | ICD-10-CM | POA: Diagnosis not present

## 2018-08-13 DIAGNOSIS — N186 End stage renal disease: Secondary | ICD-10-CM | POA: Diagnosis not present

## 2018-08-13 DIAGNOSIS — K769 Liver disease, unspecified: Secondary | ICD-10-CM | POA: Diagnosis not present

## 2018-08-13 DIAGNOSIS — D631 Anemia in chronic kidney disease: Secondary | ICD-10-CM | POA: Diagnosis not present

## 2018-08-13 DIAGNOSIS — Z79899 Other long term (current) drug therapy: Secondary | ICD-10-CM | POA: Diagnosis not present

## 2018-08-14 DIAGNOSIS — K769 Liver disease, unspecified: Secondary | ICD-10-CM | POA: Diagnosis not present

## 2018-08-14 DIAGNOSIS — Z79899 Other long term (current) drug therapy: Secondary | ICD-10-CM | POA: Diagnosis not present

## 2018-08-14 DIAGNOSIS — E878 Other disorders of electrolyte and fluid balance, not elsewhere classified: Secondary | ICD-10-CM | POA: Diagnosis not present

## 2018-08-14 DIAGNOSIS — N186 End stage renal disease: Secondary | ICD-10-CM | POA: Diagnosis not present

## 2018-08-14 DIAGNOSIS — D631 Anemia in chronic kidney disease: Secondary | ICD-10-CM | POA: Diagnosis not present

## 2018-08-16 DIAGNOSIS — Z79899 Other long term (current) drug therapy: Secondary | ICD-10-CM | POA: Diagnosis not present

## 2018-08-16 DIAGNOSIS — D631 Anemia in chronic kidney disease: Secondary | ICD-10-CM | POA: Diagnosis not present

## 2018-08-16 DIAGNOSIS — E878 Other disorders of electrolyte and fluid balance, not elsewhere classified: Secondary | ICD-10-CM | POA: Diagnosis not present

## 2018-08-16 DIAGNOSIS — N186 End stage renal disease: Secondary | ICD-10-CM | POA: Diagnosis not present

## 2018-08-16 DIAGNOSIS — K769 Liver disease, unspecified: Secondary | ICD-10-CM | POA: Diagnosis not present

## 2018-08-16 NOTE — Progress Notes (Signed)
Subjective: 73 year old female presents the office today for evaluation of a wound on the right foot and for a pre-ulcerative area on the left foot. She has not noticed any drainage or pus pain to the area no increase in swelling or redness to her feet.  She has no other concerns today. She denies any fevers, chills, nausea, vomiting.  No calf pain, chest pain, shortness of breath  Objective: AAO x3, NAD Neurovascular status unchanged.   On the plantar aspect of the right foot on the medial midfoot is an ulceration measuring 0.4 x 0.3 x 0.3 cm after debridement.  Overall the wound is the same size as it was last appointment.  However prior to debridement.  Wound is healed however callus formed overlying the area to reveal the underlying ulceration.  There is no probing to bone, undermining or tunneling.  There is no surrounding erythema, ascending cellulitis.  No fluctuation crepitation any malodor. On the left foot submetatarsal 1 is a hyperkeratotic lesion with a small superficial area of skin breakdown measuring 0.2 x 0.2 cm.  No probing to bone, and the No signs of infection.  No other open lesions identified at this time. No pain with calf compression, swelling, warmth, erythema  Assessment: Left foot ulcerations  Plan: -All treatment options discussed with the patient including all alternatives, risks, complications.  -Sharply debrided the wound on the right side without any complications or bleeding utilized #312 blade scalpel down to healthy, granular tissue.  Continue with collagen silver dressing changes. -I debrided the callus and wound on the left foot submetatarsal utilizing a #312 blade.  Recommend moisturizer to this area daily and monitor for any skin breakdown. -Offloading at all times. -Continue daily dressing changes  -Monitor for any clinical signs or symptoms of infection and directed to call the office immediately should any occur or go to the ER.  [name]  DPM

## 2018-08-17 ENCOUNTER — Emergency Department (HOSPITAL_COMMUNITY)
Admission: EM | Admit: 2018-08-17 | Discharge: 2018-08-17 | Disposition: A | Discharge status: Home / Self Care | Payer: Medicare Other | Attending: Emergency Medicine | Admitting: Emergency Medicine

## 2018-08-17 ENCOUNTER — Other Ambulatory Visit: Payer: Self-pay

## 2018-08-17 ENCOUNTER — Encounter (HOSPITAL_COMMUNITY): Payer: Self-pay

## 2018-08-17 DIAGNOSIS — E878 Other disorders of electrolyte and fluid balance, not elsewhere classified: Secondary | ICD-10-CM | POA: Diagnosis not present

## 2018-08-17 DIAGNOSIS — R040 Epistaxis: Secondary | ICD-10-CM | POA: Diagnosis not present

## 2018-08-17 DIAGNOSIS — N184 Chronic kidney disease, stage 4 (severe): Secondary | ICD-10-CM | POA: Insufficient documentation

## 2018-08-17 DIAGNOSIS — D631 Anemia in chronic kidney disease: Secondary | ICD-10-CM | POA: Diagnosis not present

## 2018-08-17 DIAGNOSIS — Z87891 Personal history of nicotine dependence: Secondary | ICD-10-CM | POA: Diagnosis not present

## 2018-08-17 DIAGNOSIS — Z7982 Long term (current) use of aspirin: Secondary | ICD-10-CM | POA: Insufficient documentation

## 2018-08-17 DIAGNOSIS — Z91040 Latex allergy status: Secondary | ICD-10-CM | POA: Diagnosis not present

## 2018-08-17 DIAGNOSIS — Z794 Long term (current) use of insulin: Secondary | ICD-10-CM | POA: Diagnosis not present

## 2018-08-17 DIAGNOSIS — I2510 Atherosclerotic heart disease of native coronary artery without angina pectoris: Secondary | ICD-10-CM | POA: Diagnosis not present

## 2018-08-17 DIAGNOSIS — Z853 Personal history of malignant neoplasm of breast: Secondary | ICD-10-CM | POA: Diagnosis not present

## 2018-08-17 DIAGNOSIS — I129 Hypertensive chronic kidney disease with stage 1 through stage 4 chronic kidney disease, or unspecified chronic kidney disease: Secondary | ICD-10-CM | POA: Insufficient documentation

## 2018-08-17 DIAGNOSIS — I10 Essential (primary) hypertension: Secondary | ICD-10-CM | POA: Diagnosis not present

## 2018-08-17 DIAGNOSIS — N186 End stage renal disease: Secondary | ICD-10-CM | POA: Diagnosis not present

## 2018-08-17 DIAGNOSIS — K769 Liver disease, unspecified: Secondary | ICD-10-CM | POA: Diagnosis not present

## 2018-08-17 DIAGNOSIS — Z79899 Other long term (current) drug therapy: Secondary | ICD-10-CM | POA: Insufficient documentation

## 2018-08-17 DIAGNOSIS — E1122 Type 2 diabetes mellitus with diabetic chronic kidney disease: Secondary | ICD-10-CM | POA: Insufficient documentation

## 2018-08-17 LAB — CBC
HCT: 33.1 % — ABNORMAL LOW (ref 36.0–46.0)
Hemoglobin: 10.8 g/dL — ABNORMAL LOW (ref 12.0–15.0)
MCH: 29.6 pg (ref 26.0–34.0)
MCHC: 32.6 g/dL (ref 30.0–36.0)
MCV: 90.7 fL (ref 80.0–100.0)
Platelets: 223 10*3/uL (ref 150–400)
RBC: 3.65 MIL/uL — ABNORMAL LOW (ref 3.87–5.11)
RDW: 17.6 % — ABNORMAL HIGH (ref 11.5–15.5)
WBC: 8.4 10*3/uL (ref 4.0–10.5)
nRBC: 0 % (ref 0.0–0.2)

## 2018-08-17 MED ORDER — OXYMETAZOLINE HCL 0.05 % NA SOLN
1.0000 | Freq: Once | NASAL | Status: AC
  Administered 2018-08-17: 14:00:00 1 via NASAL
  Filled 2018-08-17: qty 30

## 2018-08-17 MED ORDER — OXYMETAZOLINE HCL 0.05 % NA SOLN
1.0000 | Freq: Once | NASAL | Status: AC
  Administered 2018-08-17: 14:00:00 1 via NASAL

## 2018-08-17 NOTE — ED Provider Notes (Signed)
[facility] DEPT Provider Note   CSN: [id number] Arrival date & time: 08/17/18  1406    History   Chief Complaint Chief Complaint  Patient presents with  . Epistaxis    HPI [name] is a 73 y.o. female.     HPI Patient is a 73 year old female who does home peritoneal dialysis who presents the emergency department with acute right-sided epistaxis which began today at approximately 11 AM.  She is had difficulty controlling the bleeding at home and thus she came to the ER for evaluation.  She is on aspirin and no other anticoagulants.  She reports recent "stuffy nose" for which she has been blowing her nose often.  No chest pain or cough.  No fevers or chills.  No history of significant epistaxis before in the past  Past Medical History:  Diagnosis Date  . Allergy   . Arthritis    "maybe in my lower back" (08/27/2012)  . 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 Chronic combined systolic and diastolic CHF (congestive heart failure) ([place])   . CKD (chronic kidney disease), stage IV ([place])   . Coronary artery disease    a. NSTEMI in 05/2009 s/p CABG (LIMA-LAD, SVG-diagonal, SVG-OM1/OM 2).   Marland Kitchen Dyspnea    with exertion  . Family history of anesthesia complication    Mom has a hard time to wake up  . Foot ulcer ([place])    "I've had them on both feet" (08/27/2012)  . Gouty arthritis    "right index finger" (08/27/2012)  . History of radiation therapy 06/09/13-07/06/13   left breast 50Gy  . Hyperlipidemia   . Hypertension   . Infection    right second toe  . Ischemic cardiomyopathy    a. 2011: EF 40-45%. b. EF 55-60% in 04/2017 but shortly after as inpatient was 45-50%.  Marland Kitchen LBBB (left bundle branch block)   . Mitral regurgitation   . Neuropathy    Hx; of B/L feet  . NSTEMI (non-ST elevated myocardial infarction) ([place]) 05/23/2009  . Osteomyelitis of foot ([place])   . PAD (peripheral artery disease) ([facility])     a. LE PAD (patient previously elected hold off L fem-pop), prev followed by Dr. Bridgett Larsson  . PONV (postoperative nausea and vomiting)   . Sinus headache    "occasionally" (08/27/2012)  . Type II diabetes mellitus ([place])   . Vitamin D deficiency     Patient Active Problem List   Diagnosis Date Noted  . ESRD (end stage renal disease) ([place])   . Generalized weakness   . Mitral valve mass   . Coronary artery disease involving coronary bypass graft of native heart without angina pectoris   . Bradycardia   . Anemia 05/03/2017  . Chronic kidney disease (CKD), active medical management without dialysis, stage 5 ([place]) 05/08/2015  . Diabetes mellitus type 2, insulin dependent ([place]) 05/08/2015  . BMI 29.0-29.9,adult 02/14/2015  . Medication management 08/04/2014  . Type II diabetes mellitus with neurological manifestations, uncontrolled ([place]) 02/17/2014  . Gouty arthritis   . Vitamin D deficiency   . Malignant neoplasm of upper-inner quadrant of left breast in female, estrogen receptor positive ([place]) 02/14/2013  . Peripheral vascular disease ([place]) 09/05/2009  . Hyperlipidemia 06/20/2009  . Essential hypertension 06/20/2009  . Congestive heart failure ([place]) 06/20/2009  . EMPHYSEMA 06/20/2009  . Asthma 06/20/2009    Past Surgical History:  Procedure Laterality Date  . AMPUTATION Right 08/27/2012  Procedure: AMPUTATION RAY;  Surgeon: Newt Minion, MD;  Location: [facility];  Service: Orthopedics;  Laterality: Right;  Right Foot 5th Ray Amputation  . AMPUTATION Left 07/08/2013   Procedure: AMPUTATION RAY;  Surgeon: Newt Minion, MD;  Location: [facility];  Service: Orthopedics;  Laterality: Left;  Left Foot 2nd Ray Amputation  . AMPUTATION RAY Right 08/27/2012   5th ray/notes 08/27/2012  . AMPUTATION TOE Right 03/06/2017   Procedure: AMPUTATION TOE, INTERPHANGEAL 2ND RIGHT;  Surgeon: [name], DPM;  Location: [facility];  Service: Podiatry;  Laterality: Right;  . AV FISTULA PLACEMENT Right 11/11/2017    Procedure: RIGHT RADIOCEPHALIC  ARTERIOVENOUS FISTULA;  Surgeon: [name], MD;  Location: [facility];  Service: Vascular;  Laterality: Right;  . BREAST LUMPECTOMY  04/20/2013   with biopsy      DR WAKEFIELD  . BREAST LUMPECTOMY WITH NEEDLE LOCALIZATION AND AXILLARY SENTINEL LYMPH NODE BX Left 04/20/2013   Procedure: LEFT BREAST WIRE GUIDED LUMPECTOMY AND AXILLARY SENTINEL LYMPH NODE BX;  Surgeon: Rolm Bookbinder, MD;  Location: [facility];  Service: General;  Laterality: Left;  . CARDIAC CATHETERIZATION  05/24/2009   Archie Endo 05/24/2009 (08/27/2012)  . CATARACT EXTRACTION W/ INTRAOCULAR LENS  IMPLANT, BILATERAL  2000's  . COLONOSCOPY W/ BIOPSIES AND POLYPECTOMY  2010  . CORONARY ARTERY BYPASS GRAFT  2011   "CABG X4" (08/27/2012)  . DENTAL SURGERY  04/30/11   "1 implant" (08/27/2012)  . DILATION AND CURETTAGE OF UTERUS    . EYE SURGERY    . FINGER SURGERY Right    "index finger; turned out to be gout" (08/27/2012)  . IR FLUORO GUIDE CV LINE RIGHT  11/30/2017  . TEE WITHOUT CARDIOVERSION N/A 12/01/2017   Procedure: TRANSESOPHAGEAL ECHOCARDIOGRAM (TEE);  Surgeon: Lelon Perla, MD;  Location: [facility] ENDOSCOPY;  Service: Cardiovascular;  Laterality: N/A;     OB History   No obstetric history on file.      Home Medications    Prior to Admission medications   Medication Sig Start Date End Date Taking? Authorizing Provider  acetaminophen (TYLENOL) 500 MG tablet Take 1,000 mg by mouth every 6 (six) hours as needed for mild pain or headache.     [provider]  allopurinol (ZYLOPRIM) 100 MG tablet TAKE 1 TABLET TWICE DAILY 06/16/18   Unk Pinto, MD  aspirin EC 81 MG tablet Take 1 tablet (81 mg total) by mouth daily. 05/18/17   Burtis Junes, NP  Aspirin-Acetaminophen-Caffeine (EXCEDRIN EXTRA STRENGTH PO) Take by mouth as needed.    [provider]  b complex-vitamin c-folic acid (NEPHRO-VITE) 0.8 MG TABS tablet TAKE 1 TABLET BY MOUTH EVERY DAY (ON DIALYSIS DAYS, TAKE AFTER  DIALYSIS TREATMENT) 03/19/18   [provider]  BD VEO INSULIN SYRINGE U/F 31G X 15/64" 1 ML MISC USE AS DIRECTED WITH INSULIN 02/06/18   Unk Pinto, MD  Cholecalciferol (VITAMIN D3) 2000 units TABS Take 4,000 Units by mouth daily.     [provider]  docusate sodium (COLACE) 100 MG capsule Take 100 mg by mouth at bedtime.    [provider]  ethyl chloride spray SPRAY 3 TIMES PER WEEK 04/09/18   [provider]  fluticasone (FLONASE) 50 MCG/ACT nasal spray Place 2 sprays into both nostrils daily as needed for allergies or rhinitis.    [provider]  glucose blood (PRODIGY NO CODING BLOOD GLUC) test strip CHECK SUGARS 3 TIMES A DAY.[id number] 02/08/18   Unk Pinto, MD  hydrALAZINE (APRESOLINE)  25 MG tablet TAKE 3 TABLETS THREE TIMES DAILY 06/29/18   Unk Pinto, MD  insulin NPH-regular Human (70-30) 100 UNIT/ML injection Inject 10-30 Units into the skin 2 (two) times daily with a meal.    [provider]  isosorbide dinitrate (ISORDIL) 10 MG tablet TAKE 1 TABLET THREE TIMES DAILY 05/19/18   Unk Pinto, MD  Multiple Vitamins-Minerals ([place] PO) Take 1 tablet by mouth at bedtime.     [provider]  Polyethyl Glycol-Propyl Glycol (LUBRICANT EYE DROPS) 0.4-0.3 % SOLN Place 1-2 drops into both eyes 3 (three) times daily as needed (for dry eyes.).    [provider]  psyllium (METAMUCIL) 58.6 % powder Take 1 packet by mouth 3 (three) times daily.    [provider]  sevelamer carbonate (RENVELA) 800 MG tablet TAKE 2 TABLETS BY MOUTH THREE TIMES DAILY WITH MEALS 04/20/18   [provider]    Family History Family History  Problem Relation Age of Onset  . Kidney cancer Brother 73  . Hypertension Brother   . Breast cancer Sister 68       LCIS; BRCA negative  . Throat cancer Father 44       smoker  . Breast cancer Sister 12       Lobular breast cancer  . Breast cancer  Maternal Aunt        dx in her 19s    Social History Social History   Tobacco Use  . Smoking status: Former Research scientist (life sciences)  . Smokeless tobacco: Never Used  Substance Use Topics  . Alcohol use: Yes    Comment: rarely drinks wine  . Drug use: No     Allergies   Atenolol; Adhesive [tape]; Contrast media [iodinated diagnostic agents]; Latex; and Omnipaque [iohexol]   Review of Systems Review of Systems  All other systems reviewed and are negative.    Physical Exam Updated Vital Signs BP (!) 172/62   Pulse (!) 101   Temp 98.3 F (36.8 C) (Axillary)   Resp 19   SpO2 97%   Physical Exam Vitals signs and nursing note reviewed.  Constitutional:      Appearance: She is well-developed.  HENT:     Head: Normocephalic.     Comments: Bleeding from the right nare unable to be controlled with Afrin and direct pressure after clot removal Neck:     Musculoskeletal: Normal range of motion.  Pulmonary:     Effort: Pulmonary effort is normal.  Abdominal:     General: There is no distension.  Musculoskeletal: Normal range of motion.  Neurological:     Mental Status: She is alert and oriented to person, place, and time.      ED Treatments / Results  Labs (all labs ordered are listed, but only abnormal results are displayed) Labs Reviewed  CBC    EKG None  Radiology No results found.  Procedures .Epistaxis Management Date/Time: 08/17/2018 2:44 PM Performed by: Jola Schmidt, MD Authorized by: Jola Schmidt, MD   Consent:    Consent obtained:  Verbal   Consent given by:  Patient   Risks discussed:  Bleeding Procedure details:    Treatment site:  R posterior and R anterior   Treatment method:  Merocel sponge   Treatment complexity:  Extensive   Treatment episode: initial   Post-procedure details:    Assessment:  Bleeding stopped   Patient tolerance of procedure:  Tolerated well, no immediate complications    Medications Ordered in ED Medications  oxymetazoline (AFRIN) 0.05 % nasal spray 1 spray (1 spray Each Nare Given 08/17/18 1425)  oxymetazoline (AFRIN) 0.05 % nasal spray 1 spray (1 spray Each Nare Given 08/17/18 1425)     Initial Impression / Assessment and Plan / ED Course  I have reviewed the triage vital signs and the nursing notes.  Pertinent labs & imaging results that were available during my care of the patient were reviewed by me and considered in my medical decision making (see chart for details).        Unable to initially stop bleeding with direct pressure and Afrin spray after clearing of the clot.  Bleeding appears to be slightly more posterior.  No obvious anterior source found.  Merocel packing placed with resolution of bleeding.  Will check hemoglobin and platelets at this time.  Will observe in the emergency department for recurrence of her bleed.  Care transferred to oncoming physician at 3 PM  Final Clinical Impressions(s) / ED Diagnoses   Final diagnoses:  None    ED Discharge Orders    None       Jola Schmidt, MD 08/17/18 1445

## 2018-08-17 NOTE — ED Notes (Signed)
Pt will wait 10 more minutes to monitor bleeding before D/C.

## 2018-08-17 NOTE — ED Provider Notes (Signed)
Bleeding resolved. Well appearing. ENT follow up   Jola Schmidt, MD 08/17/18 1513

## 2018-08-17 NOTE — ED Notes (Signed)
Pt's nose began bleeding through nasal rocket on R side when attempting to D/C.  Dr. Rex Kras made aware.

## 2018-08-17 NOTE — ED Triage Notes (Signed)
Pt states her nose has been bleeding since 11 am this am.  Pt nose is bleeding profusely in triage.  Pt is on dialysis and take heparin and ASA.  R arm restriction due to fistula.

## 2018-08-17 NOTE — ED Notes (Signed)
Pt no longer bleeding, pt will be D/C.

## 2018-08-18 DIAGNOSIS — Z79899 Other long term (current) drug therapy: Secondary | ICD-10-CM | POA: Diagnosis not present

## 2018-08-18 DIAGNOSIS — E878 Other disorders of electrolyte and fluid balance, not elsewhere classified: Secondary | ICD-10-CM | POA: Diagnosis not present

## 2018-08-18 DIAGNOSIS — D631 Anemia in chronic kidney disease: Secondary | ICD-10-CM | POA: Diagnosis not present

## 2018-08-18 DIAGNOSIS — N186 End stage renal disease: Secondary | ICD-10-CM | POA: Diagnosis not present

## 2018-08-18 DIAGNOSIS — K769 Liver disease, unspecified: Secondary | ICD-10-CM | POA: Diagnosis not present

## 2018-08-19 DIAGNOSIS — Z79899 Other long term (current) drug therapy: Secondary | ICD-10-CM | POA: Diagnosis not present

## 2018-08-19 DIAGNOSIS — N186 End stage renal disease: Secondary | ICD-10-CM | POA: Diagnosis not present

## 2018-08-19 DIAGNOSIS — K769 Liver disease, unspecified: Secondary | ICD-10-CM | POA: Diagnosis not present

## 2018-08-19 DIAGNOSIS — D631 Anemia in chronic kidney disease: Secondary | ICD-10-CM | POA: Diagnosis not present

## 2018-08-19 DIAGNOSIS — E878 Other disorders of electrolyte and fluid balance, not elsewhere classified: Secondary | ICD-10-CM | POA: Diagnosis not present

## 2018-08-20 DIAGNOSIS — N186 End stage renal disease: Secondary | ICD-10-CM | POA: Diagnosis not present

## 2018-08-20 DIAGNOSIS — Z79899 Other long term (current) drug therapy: Secondary | ICD-10-CM | POA: Diagnosis not present

## 2018-08-20 DIAGNOSIS — D631 Anemia in chronic kidney disease: Secondary | ICD-10-CM | POA: Diagnosis not present

## 2018-08-20 DIAGNOSIS — E878 Other disorders of electrolyte and fluid balance, not elsewhere classified: Secondary | ICD-10-CM | POA: Diagnosis not present

## 2018-08-20 DIAGNOSIS — K769 Liver disease, unspecified: Secondary | ICD-10-CM | POA: Diagnosis not present

## 2018-08-21 DIAGNOSIS — Z79899 Other long term (current) drug therapy: Secondary | ICD-10-CM | POA: Diagnosis not present

## 2018-08-21 DIAGNOSIS — D631 Anemia in chronic kidney disease: Secondary | ICD-10-CM | POA: Diagnosis not present

## 2018-08-21 DIAGNOSIS — N186 End stage renal disease: Secondary | ICD-10-CM | POA: Diagnosis not present

## 2018-08-21 DIAGNOSIS — K769 Liver disease, unspecified: Secondary | ICD-10-CM | POA: Diagnosis not present

## 2018-08-21 DIAGNOSIS — E878 Other disorders of electrolyte and fluid balance, not elsewhere classified: Secondary | ICD-10-CM | POA: Diagnosis not present

## 2018-08-22 DIAGNOSIS — N186 End stage renal disease: Secondary | ICD-10-CM | POA: Diagnosis not present

## 2018-08-22 DIAGNOSIS — K769 Liver disease, unspecified: Secondary | ICD-10-CM | POA: Diagnosis not present

## 2018-08-22 DIAGNOSIS — D631 Anemia in chronic kidney disease: Secondary | ICD-10-CM | POA: Diagnosis not present

## 2018-08-22 DIAGNOSIS — Z79899 Other long term (current) drug therapy: Secondary | ICD-10-CM | POA: Diagnosis not present

## 2018-08-22 DIAGNOSIS — E878 Other disorders of electrolyte and fluid balance, not elsewhere classified: Secondary | ICD-10-CM | POA: Diagnosis not present

## 2018-08-23 DIAGNOSIS — E878 Other disorders of electrolyte and fluid balance, not elsewhere classified: Secondary | ICD-10-CM | POA: Diagnosis not present

## 2018-08-23 DIAGNOSIS — K769 Liver disease, unspecified: Secondary | ICD-10-CM | POA: Diagnosis not present

## 2018-08-23 DIAGNOSIS — Z79899 Other long term (current) drug therapy: Secondary | ICD-10-CM | POA: Diagnosis not present

## 2018-08-23 DIAGNOSIS — R040 Epistaxis: Secondary | ICD-10-CM | POA: Diagnosis not present

## 2018-08-23 DIAGNOSIS — D631 Anemia in chronic kidney disease: Secondary | ICD-10-CM | POA: Diagnosis not present

## 2018-08-23 DIAGNOSIS — N186 End stage renal disease: Secondary | ICD-10-CM | POA: Diagnosis not present

## 2018-08-24 ENCOUNTER — Other Ambulatory Visit: Payer: Self-pay

## 2018-08-24 ENCOUNTER — Encounter (HOSPITAL_COMMUNITY): Payer: Self-pay

## 2018-08-24 ENCOUNTER — Emergency Department (HOSPITAL_COMMUNITY)
Admission: EM | Admit: 2018-08-24 | Discharge: 2018-08-25 | Disposition: A | Discharge status: Home / Self Care | Payer: Medicare Other | Attending: Emergency Medicine | Admitting: Emergency Medicine

## 2018-08-24 DIAGNOSIS — I739 Peripheral vascular disease, unspecified: Secondary | ICD-10-CM | POA: Diagnosis not present

## 2018-08-24 DIAGNOSIS — Z87891 Personal history of nicotine dependence: Secondary | ICD-10-CM | POA: Insufficient documentation

## 2018-08-24 DIAGNOSIS — I252 Old myocardial infarction: Secondary | ICD-10-CM | POA: Diagnosis not present

## 2018-08-24 DIAGNOSIS — R609 Edema, unspecified: Secondary | ICD-10-CM | POA: Diagnosis not present

## 2018-08-24 DIAGNOSIS — E11649 Type 2 diabetes mellitus with hypoglycemia without coma: Secondary | ICD-10-CM | POA: Insufficient documentation

## 2018-08-24 DIAGNOSIS — D631 Anemia in chronic kidney disease: Secondary | ICD-10-CM | POA: Diagnosis not present

## 2018-08-24 DIAGNOSIS — E1165 Type 2 diabetes mellitus with hyperglycemia: Secondary | ICD-10-CM | POA: Diagnosis not present

## 2018-08-24 DIAGNOSIS — Z794 Long term (current) use of insulin: Secondary | ICD-10-CM | POA: Diagnosis not present

## 2018-08-24 DIAGNOSIS — I130 Hypertensive heart and chronic kidney disease with heart failure and stage 1 through stage 4 chronic kidney disease, or unspecified chronic kidney disease: Secondary | ICD-10-CM | POA: Diagnosis not present

## 2018-08-24 DIAGNOSIS — Z853 Personal history of malignant neoplasm of breast: Secondary | ICD-10-CM | POA: Insufficient documentation

## 2018-08-24 DIAGNOSIS — Z79899 Other long term (current) drug therapy: Secondary | ICD-10-CM | POA: Insufficient documentation

## 2018-08-24 DIAGNOSIS — N184 Chronic kidney disease, stage 4 (severe): Secondary | ICD-10-CM | POA: Insufficient documentation

## 2018-08-24 DIAGNOSIS — E162 Hypoglycemia, unspecified: Secondary | ICD-10-CM

## 2018-08-24 DIAGNOSIS — K769 Liver disease, unspecified: Secondary | ICD-10-CM | POA: Diagnosis not present

## 2018-08-24 DIAGNOSIS — Z91040 Latex allergy status: Secondary | ICD-10-CM | POA: Insufficient documentation

## 2018-08-24 DIAGNOSIS — I5041 Acute combined systolic (congestive) and diastolic (congestive) heart failure: Secondary | ICD-10-CM | POA: Insufficient documentation

## 2018-08-24 DIAGNOSIS — G459 Transient cerebral ischemic attack, unspecified: Secondary | ICD-10-CM | POA: Diagnosis not present

## 2018-08-24 DIAGNOSIS — Z992 Dependence on renal dialysis: Secondary | ICD-10-CM | POA: Insufficient documentation

## 2018-08-24 DIAGNOSIS — Z7982 Long term (current) use of aspirin: Secondary | ICD-10-CM | POA: Insufficient documentation

## 2018-08-24 DIAGNOSIS — I4510 Unspecified right bundle-branch block: Secondary | ICD-10-CM | POA: Diagnosis not present

## 2018-08-24 DIAGNOSIS — N186 End stage renal disease: Secondary | ICD-10-CM | POA: Diagnosis not present

## 2018-08-24 DIAGNOSIS — I2510 Atherosclerotic heart disease of native coronary artery without angina pectoris: Secondary | ICD-10-CM | POA: Insufficient documentation

## 2018-08-24 DIAGNOSIS — R410 Disorientation, unspecified: Secondary | ICD-10-CM | POA: Diagnosis not present

## 2018-08-24 DIAGNOSIS — E878 Other disorders of electrolyte and fluid balance, not elsewhere classified: Secondary | ICD-10-CM | POA: Diagnosis not present

## 2018-08-24 DIAGNOSIS — J811 Chronic pulmonary edema: Secondary | ICD-10-CM | POA: Diagnosis not present

## 2018-08-24 DIAGNOSIS — R4182 Altered mental status, unspecified: Secondary | ICD-10-CM | POA: Diagnosis present

## 2018-08-24 DIAGNOSIS — E161 Other hypoglycemia: Secondary | ICD-10-CM | POA: Diagnosis not present

## 2018-08-24 NOTE — ED Triage Notes (Addendum)
Pt arrived via [facility]; pt from hm with c/o hypoglycemia; EMS reports pt with AMS upon arrival and CBG was 42; family administered 30G of oral glucose prior to EMS arrival; EMS administered 12.5g of D10 and pt was more responsive; CBG increased to 142, pt ate sandwich and juice, CBG decreased to 82, pt continued eating another sandwich, CBG decreased to 74, EMS administered another 12.5g D10; CBG 290; Pt does peritoneal dialysis at home and has missed last two treatments; EMS reports that it was possible accidental insulin overdose; EKG shows LBBB and SR with 1 degree block; 149/62, 76, 96% on RA, 18; EMS placed 18G in RFA

## 2018-08-25 ENCOUNTER — Emergency Department (HOSPITAL_COMMUNITY): Payer: Medicare Other

## 2018-08-25 DIAGNOSIS — E11649 Type 2 diabetes mellitus with hypoglycemia without coma: Secondary | ICD-10-CM | POA: Diagnosis not present

## 2018-08-25 DIAGNOSIS — N186 End stage renal disease: Secondary | ICD-10-CM | POA: Diagnosis not present

## 2018-08-25 DIAGNOSIS — E878 Other disorders of electrolyte and fluid balance, not elsewhere classified: Secondary | ICD-10-CM | POA: Diagnosis not present

## 2018-08-25 DIAGNOSIS — K769 Liver disease, unspecified: Secondary | ICD-10-CM | POA: Diagnosis not present

## 2018-08-25 DIAGNOSIS — J811 Chronic pulmonary edema: Secondary | ICD-10-CM | POA: Diagnosis not present

## 2018-08-25 DIAGNOSIS — D631 Anemia in chronic kidney disease: Secondary | ICD-10-CM | POA: Diagnosis not present

## 2018-08-25 DIAGNOSIS — Z79899 Other long term (current) drug therapy: Secondary | ICD-10-CM | POA: Diagnosis not present

## 2018-08-25 LAB — LACTIC ACID, PLASMA: Lactic Acid, Venous: 1.1 mmol/L (ref 0.5–1.9)

## 2018-08-25 LAB — CBC WITH DIFFERENTIAL/PLATELET
Abs Immature Granulocytes: 0.07 10*3/uL (ref 0.00–0.07)
Basophils Absolute: 0 10*3/uL (ref 0.0–0.1)
Basophils Relative: 0 %
Eosinophils Absolute: 0.1 10*3/uL (ref 0.0–0.5)
Eosinophils Relative: 0 %
HCT: 35.1 % — ABNORMAL LOW (ref 36.0–46.0)
Hemoglobin: 11.6 g/dL — ABNORMAL LOW (ref 12.0–15.0)
Immature Granulocytes: 1 %
Lymphocytes Relative: 7 %
Lymphs Abs: 0.8 10*3/uL (ref 0.7–4.0)
MCH: 30.1 pg (ref 26.0–34.0)
MCHC: 33 g/dL (ref 30.0–36.0)
MCV: 90.9 fL (ref 80.0–100.0)
Monocytes Absolute: 0.9 10*3/uL (ref 0.1–1.0)
Monocytes Relative: 8 %
Neutro Abs: 9.8 10*3/uL — ABNORMAL HIGH (ref 1.7–7.7)
Neutrophils Relative %: 84 %
Platelets: 278 10*3/uL (ref 150–400)
RBC: 3.86 MIL/uL — ABNORMAL LOW (ref 3.87–5.11)
RDW: 18.5 % — ABNORMAL HIGH (ref 11.5–15.5)
WBC: 11.6 10*3/uL — ABNORMAL HIGH (ref 4.0–10.5)
nRBC: 0 % (ref 0.0–0.2)

## 2018-08-25 LAB — COMPREHENSIVE METABOLIC PANEL
ALT: 26 U/L (ref 0–44)
AST: 30 U/L (ref 15–41)
Albumin: 3.2 g/dL — ABNORMAL LOW (ref 3.5–5.0)
Alkaline Phosphatase: 108 U/L (ref 38–126)
Anion gap: 15 (ref 5–15)
BUN: 64 mg/dL — ABNORMAL HIGH (ref 8–23)
CO2: 20 mmol/L — ABNORMAL LOW (ref 22–32)
Calcium: 9.4 mg/dL (ref 8.9–10.3)
Chloride: 99 mmol/L (ref 98–111)
Creatinine, Ser: 4.13 mg/dL — ABNORMAL HIGH (ref 0.44–1.00)
GFR calc Af Amer: 12 mL/min — ABNORMAL LOW (ref >60)
GFR calc non Af Amer: 10 mL/min — ABNORMAL LOW (ref >60)
Glucose, Bld: 106 mg/dL — ABNORMAL HIGH (ref 70–99)
Potassium: 3.1 mmol/L — ABNORMAL LOW (ref 3.5–5.1)
Sodium: 134 mmol/L — ABNORMAL LOW (ref 135–145)
Total Bilirubin: 0.7 mg/dL (ref 0.3–1.2)
Total Protein: 6.4 g/dL — ABNORMAL LOW (ref 6.5–8.1)

## 2018-08-25 LAB — URINALYSIS, ROUTINE W REFLEX MICROSCOPIC
Bacteria, UA: NONE SEEN
Bilirubin Urine: NEGATIVE
Glucose, UA: 50 mg/dL — AB
Hgb urine dipstick: NEGATIVE
Ketones, ur: NEGATIVE mg/dL
Leukocytes,Ua: NEGATIVE
Nitrite: NEGATIVE
Protein, ur: 100 mg/dL — AB
Specific Gravity, Urine: 1.014 (ref 1.005–1.030)
pH: 5 (ref 5.0–8.0)

## 2018-08-25 LAB — CBG MONITORING, ED
Glucose-Capillary: 107 mg/dL — ABNORMAL HIGH (ref 70–99)
Glucose-Capillary: 109 mg/dL — ABNORMAL HIGH (ref 70–99)
Glucose-Capillary: 111 mg/dL — ABNORMAL HIGH (ref 70–99)
Glucose-Capillary: 112 mg/dL — ABNORMAL HIGH (ref 70–99)
Glucose-Capillary: 112 mg/dL — ABNORMAL HIGH (ref 70–99)
Glucose-Capillary: 123 mg/dL — ABNORMAL HIGH (ref 70–99)
Glucose-Capillary: 145 mg/dL — ABNORMAL HIGH (ref 70–99)
Glucose-Capillary: 86 mg/dL (ref 70–99)
Glucose-Capillary: 92 mg/dL (ref 70–99)
Glucose-Capillary: 92 mg/dL (ref 70–99)

## 2018-08-25 LAB — TROPONIN I: Troponin I: 0.07 ng/mL (ref <0.03)

## 2018-08-25 LAB — TSH: TSH: 3.754 u[IU]/mL (ref 0.350–4.500)

## 2018-08-25 LAB — BRAIN NATRIURETIC PEPTIDE: B Natriuretic Peptide: 825 pg/mL — ABNORMAL HIGH (ref 0.0–100.0)

## 2018-08-25 MED ORDER — ONDANSETRON HCL 4 MG/2ML IJ SOLN
4.0000 mg | Freq: Once | INTRAMUSCULAR | Status: AC
  Administered 2018-08-25: 01:00:00 4 mg via INTRAVENOUS
  Filled 2018-08-25: qty 2

## 2018-08-25 NOTE — ED Notes (Signed)
Pt sats low 80's while asleep. Woke pt up and instructed to take deep breaths, sats back up to upper 90's. Placed on 3L [place] while pt is alseep.

## 2018-08-25 NOTE — ED Notes (Signed)
Pt placed on bear warmer

## 2018-08-25 NOTE — Discharge Instructions (Addendum)
You were seen today for low blood sugars.  Your blood sugars improved.  The rest of your work-up was largely unremarkable.  Monitor your blood sugars closely at home.  Make sure to take your diabetes medications as directed.  Eat frequent small meals.  Follow-up with your primary physician.

## 2018-08-25 NOTE — ED Notes (Signed)
Patient drank water with no complaints

## 2018-08-25 NOTE — ED Notes (Signed)
Urine culture sent down with UA. 

## 2018-08-25 NOTE — ED Notes (Signed)
Pt discharged from ED; instructions provided and scripts given; Pt encouraged to return to ED if symptoms worsen and to f/u with PCP; Pt verbalized understanding of all instructions 

## 2018-08-25 NOTE — ED Notes (Addendum)
Spoke to IV RN about IV in fistula.  Instructions for removal and holding pressure given to primary RN.

## 2018-08-25 NOTE — ED Notes (Addendum)
RN was attempting to test patency of EMS placed IV; pt stated that "my fistula is in that arm." MD and charge nurse informed that EMS placed IV directly in fistula.

## 2018-08-25 NOTE — ED Notes (Signed)
Gave pt a sandwich, ate with no complaints

## 2018-08-25 NOTE — ED Provider Notes (Signed)
[facility] EMERGENCY DEPARTMENT Provider Note   CSN: [id number] Arrival date & time: 08/24/18  2348    History   Chief Complaint Chief Complaint  Patient presents with  . Hypoglycemia    HPI [name] is a 73 y.o. female.     HPI  This is a 73 year old female with a history of end-stage renal disease on peritoneal dialysis, heart failure, coronary artery disease, diabetes who presents with hypoglycemia.  Per EMS report, patient was noted to be altered at home.  Initial CBG was 42.  Family administered 30 g of oral glucose followed by EMS administering 12.5 g of D10.  Patient's mental status improved and she was able to tolerate food.  However, CBG started to decline and EMS had to readminister D10.  Patient does not believe she took too much of her insulin but she is unsure.  She has missed her last 2 home sessions of dialysis.  She denies any recent fevers but states she has not felt well generally today.  She denies any cough, chest pain, abdominal pain, nausea, vomiting.  Of note, IV placed by EMS noted to be in right fistula site.  Past Medical History:  Diagnosis Date  . Allergy   . Arthritis    "maybe in my lower back" (08/27/2012)  . 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 Chronic combined systolic and diastolic CHF (congestive heart failure) ([place])   . CKD (chronic kidney disease), stage IV ([place])   . Coronary artery disease    a. NSTEMI in 05/2009 s/p CABG (LIMA-LAD, SVG-diagonal, SVG-OM1/OM 2).   Marland Kitchen Dyspnea    with exertion  . Family history of anesthesia complication    Mom has a hard time to wake up  . Foot ulcer ([place])    "I've had them on both feet" (08/27/2012)  . Gouty arthritis    "right index finger" (08/27/2012)  . History of radiation therapy 06/09/13-07/06/13   left breast 50Gy  . Hyperlipidemia   . Hypertension   . Infection    right second toe  . Ischemic cardiomyopathy    a. 2011:  EF 40-45%. b. EF 55-60% in 04/2017 but shortly after as inpatient was 45-50%.  Marland Kitchen LBBB (left bundle branch block)   . Mitral regurgitation   . Neuropathy    Hx; of B/L feet  . NSTEMI (non-ST elevated myocardial infarction) ([place]) 05/23/2009  . Osteomyelitis of foot ([place])   . PAD (peripheral artery disease) ([facility])    a. LE PAD (patient previously elected hold off L fem-pop), prev followed by Dr. Bridgett Larsson  . PONV (postoperative nausea and vomiting)   . Sinus headache    "occasionally" (08/27/2012)  . Type II diabetes mellitus ([place])   . Vitamin D deficiency     Patient Active Problem List   Diagnosis Date Noted  . ESRD (end stage renal disease) ([place])   . Generalized weakness   . Mitral valve mass   . Coronary artery disease involving coronary bypass graft of native heart without angina pectoris   . Bradycardia   . Anemia 05/03/2017  . Chronic kidney disease (CKD), active medical management without dialysis, stage 5 ([place]) 05/08/2015  . Diabetes mellitus type 2, insulin dependent ([place]) 05/08/2015  . BMI 29.0-29.9,adult 02/14/2015  . Medication management 08/04/2014  . Type II diabetes mellitus with neurological manifestations, uncontrolled ([place]) 02/17/2014  . Gouty arthritis   . Vitamin D deficiency   . Malignant neoplasm  of upper-inner quadrant of left breast in female, estrogen receptor positive ([place]) 02/14/2013  . Peripheral vascular disease ([place]) 09/05/2009  . Hyperlipidemia 06/20/2009  . Essential hypertension 06/20/2009  . Congestive heart failure ([place]) 06/20/2009  . EMPHYSEMA 06/20/2009  . Asthma 06/20/2009    Past Surgical History:  Procedure Laterality Date  . AMPUTATION Right 08/27/2012   Procedure: AMPUTATION RAY;  Surgeon: Newt Minion, MD;  Location: [facility];  Service: Orthopedics;  Laterality: Right;  Right Foot 5th Ray Amputation  . AMPUTATION Left 07/08/2013   Procedure: AMPUTATION RAY;  Surgeon: Newt Minion, MD;  Location: [facility];  Service: Orthopedics;  Laterality: Left;   Left Foot 2nd Ray Amputation  . AMPUTATION RAY Right 08/27/2012   5th ray/notes 08/27/2012  . AMPUTATION TOE Right 03/06/2017   Procedure: AMPUTATION TOE, INTERPHANGEAL 2ND RIGHT;  Surgeon: [name], DPM;  Location: [facility];  Service: Podiatry;  Laterality: Right;  . AV FISTULA PLACEMENT Right 11/11/2017   Procedure: RIGHT RADIOCEPHALIC  ARTERIOVENOUS FISTULA;  Surgeon: [name], MD;  Location: [facility];  Service: Vascular;  Laterality: Right;  . BREAST LUMPECTOMY  04/20/2013   with biopsy      DR [place]  . BREAST LUMPECTOMY WITH NEEDLE LOCALIZATION AND AXILLARY SENTINEL LYMPH NODE BX Left 04/20/2013   Procedure: LEFT BREAST WIRE GUIDED LUMPECTOMY AND AXILLARY SENTINEL LYMPH NODE BX;  Surgeon: Rolm Bookbinder, MD;  Location: [employer];  Service: General;  Laterality: Left;  . CARDIAC CATHETERIZATION  05/24/2009   Archie Endo 05/24/2009 (08/27/2012)  . CATARACT EXTRACTION W/ INTRAOCULAR LENS  IMPLANT, BILATERAL  2000's  . COLONOSCOPY W/ BIOPSIES AND POLYPECTOMY  2010  . CORONARY ARTERY BYPASS GRAFT  2011   "CABG X4" (08/27/2012)  . DENTAL SURGERY  04/30/11   "1 implant" (08/27/2012)  . DILATION AND CURETTAGE OF UTERUS    . EYE SURGERY    . FINGER SURGERY Right    "index finger; turned out to be gout" (08/27/2012)  . IR FLUORO GUIDE CV LINE RIGHT  11/30/2017  . TEE WITHOUT CARDIOVERSION N/A 12/01/2017   Procedure: TRANSESOPHAGEAL ECHOCARDIOGRAM (TEE);  Surgeon: Lelon Perla, MD;  Location: [facility] ENDOSCOPY;  Service: Cardiovascular;  Laterality: N/A;     OB History   No obstetric history on file.      Home Medications    Prior to Admission medications   Medication Sig Start Date End Date Taking? Authorizing Provider  acetaminophen (TYLENOL) 500 MG tablet Take 1,000 mg by mouth every 6 (six) hours as needed for mild pain or headache.     [provider]  allopurinol (ZYLOPRIM) 100 MG tablet TAKE 1 TABLET TWICE DAILY 06/16/18   Unk Pinto, MD  aspirin EC 81 MG tablet Take  1 tablet (81 mg total) by mouth daily. 05/18/17   Burtis Junes, NP  Aspirin-Acetaminophen-Caffeine (EXCEDRIN EXTRA STRENGTH PO) Take by mouth as needed.    [provider]  b complex-vitamin c-folic acid (NEPHRO-VITE) 0.8 MG TABS tablet TAKE 1 TABLET BY MOUTH EVERY DAY (ON DIALYSIS DAYS, TAKE AFTER DIALYSIS TREATMENT) 03/19/18   [provider]  BD VEO INSULIN SYRINGE U/F 31G X 15/64" 1 ML MISC USE AS DIRECTED WITH INSULIN 02/06/18   Unk Pinto, MD  Cholecalciferol (VITAMIN D3) 2000 units TABS Take 4,000 Units by mouth daily.     [provider]  docusate sodium (COLACE) 100 MG capsule Take 100 mg by mouth at bedtime.    [provider]  ethyl chloride spray SPRAY  3 TIMES PER WEEK 04/09/18   [provider]  fluticasone (FLONASE) 50 MCG/ACT nasal spray Place 2 sprays into both nostrils daily as needed for allergies or rhinitis.    [provider]  glucose blood (PRODIGY NO CODING BLOOD GLUC) test strip CHECK SUGARS 3 TIMES A DAY.[id number] 02/08/18   Unk Pinto, MD  hydrALAZINE (APRESOLINE) 25 MG tablet TAKE 3 TABLETS THREE TIMES DAILY 06/29/18   Unk Pinto, MD  insulin NPH-regular Human (70-30) 100 UNIT/ML injection Inject 10-30 Units into the skin 2 (two) times daily with a meal.    [provider]  isosorbide dinitrate (ISORDIL) 10 MG tablet TAKE 1 TABLET THREE TIMES DAILY 05/19/18   Unk Pinto, MD  Multiple Vitamins-Minerals ([place] PO) Take 1 tablet by mouth at bedtime.     [provider]  Polyethyl Glycol-Propyl Glycol (LUBRICANT EYE DROPS) 0.4-0.3 % SOLN Place 1-2 drops into both eyes 3 (three) times daily as needed (for dry eyes.).    [provider]  psyllium (METAMUCIL) 58.6 % powder Take 1 packet by mouth 3 (three) times daily.    [provider]  sevelamer carbonate (RENVELA) 800 MG tablet TAKE 2 TABLETS BY MOUTH THREE TIMES DAILY WITH MEALS 04/20/18   [provider]    Family History Family History  Problem Relation Age of Onset  . Kidney cancer Brother 38  . Hypertension Brother   . Breast cancer Sister 11       LCIS; BRCA negative  . Throat cancer Father 79       smoker  . Breast cancer Sister 31       Lobular breast cancer  . Breast cancer Maternal Aunt        dx in her 46s    Social History Social History   Tobacco Use  . Smoking status: Former Research scientist (life sciences)  . Smokeless tobacco: Never Used  Substance Use Topics  . Alcohol use: Yes    Comment: rarely drinks wine  . Drug use: No     Allergies   Atenolol; Adhesive [tape]; Contrast media [iodinated diagnostic agents]; Latex; and Omnipaque [iohexol]   Review of Systems Review of Systems  Constitutional: Negative for chills and fever.  Respiratory: Negative for shortness of breath.   Cardiovascular: Negative for chest pain.  Gastrointestinal: Negative for abdominal pain, nausea and vomiting.  Genitourinary: Negative for dysuria.  Musculoskeletal: Negative for back pain.  Psychiatric/Behavioral: Positive for confusion.  All other systems reviewed and are negative.    Physical Exam Updated Vital Signs BP 132/62 (BP Location: Right Arm)   Pulse 93   Temp 97.7 F (36.5 C) (Oral)   Resp 19   SpO2 100%   Physical Exam Vitals signs and nursing note reviewed.  Constitutional:      Appearance: She is well-developed. She is obese.     Comments: Ill-appearing but nontoxic  HENT:     Head: Normocephalic and atraumatic.     Mouth/Throat:     Mouth: Mucous membranes are moist.  Eyes:     Pupils: Pupils are equal, round, and reactive to light.     Comments: Pupils 3 mm reactive bilaterally  Neck:     Musculoskeletal: Neck supple.  Cardiovascular:     Rate and Rhythm: Normal rate and regular rhythm.     Heart sounds: Normal heart sounds.  Pulmonary:     Effort: Pulmonary effort is normal. No respiratory distress.     Breath sounds: No wheezing.  Abdominal:  General: Bowel sounds are normal.     Palpations: Abdomen is soft.     Tenderness: There is no abdominal tenderness. There is no guarding or rebound.     Comments: Dialysis tubing right upper quadrant  Musculoskeletal:     Right lower leg: Edema present.     Left lower leg: Edema present.  Skin:    General: Skin is warm and dry.  Neurological:     Mental Status: She is alert and oriented to person, place, and time.     Comments: Cranial nerves II through XII intact, 5 out of 5 strength in all 4 extremities  Psychiatric:        Mood and Affect: Mood normal.      ED Treatments / Results  Labs (all labs ordered are listed, but only abnormal results are displayed) Labs Reviewed  CBC WITH DIFFERENTIAL/PLATELET - Abnormal; Notable for the following components:      Result Value   WBC 11.6 (*)    RBC 3.86 (*)    Hemoglobin 11.6 (*)    HCT 35.1 (*)    RDW 18.5 (*)    Neutro Abs 9.8 (*)    All other components within normal limits  COMPREHENSIVE METABOLIC PANEL - Abnormal; Notable for the following components:   Sodium 134 (*)    Potassium 3.1 (*)    CO2 20 (*)    Glucose, Bld 106 (*)    BUN 64 (*)    Creatinine, Ser 4.13 (*)    Total Protein 6.4 (*)    Albumin 3.2 (*)    GFR calc non Af Amer 10 (*)    GFR calc Af Amer 12 (*)    All other components within normal limits  URINALYSIS, ROUTINE W REFLEX MICROSCOPIC - Abnormal; Notable for the following components:   Glucose, UA 50 (*)    Protein, ur 100 (*)    All other components within normal limits  BRAIN NATRIURETIC PEPTIDE - Abnormal; Notable for the following components:   B Natriuretic Peptide 825.0 (*)    All other components within normal limits  TROPONIN I - Abnormal; Notable for the following components:   Troponin I 0.07 (*)    All other components within normal limits  CBG MONITORING, ED - Abnormal; Notable for the following components:   Glucose-Capillary 112 (*)    All other components within normal limits   CBG MONITORING, ED - Abnormal; Notable for the following components:   Glucose-Capillary 109 (*)    All other components within normal limits  CBG MONITORING, ED - Abnormal; Notable for the following components:   Glucose-Capillary 145 (*)    All other components within normal limits  CBG MONITORING, ED - Abnormal; Notable for the following components:   Glucose-Capillary 107 (*)    All other components within normal limits  CBG MONITORING, ED - Abnormal; Notable for the following components:   Glucose-Capillary 123 (*)    All other components within normal limits  CBG MONITORING, ED - Abnormal; Notable for the following components:   Glucose-Capillary 112 (*)    All other components within normal limits  CULTURE, BLOOD (ROUTINE X 2)  CULTURE, BLOOD (ROUTINE X 2)  LACTIC ACID, PLASMA  TSH  CBG MONITORING, ED  CBG MONITORING, ED  CBG MONITORING, ED    EKG EKG Interpretation  Date/Time:  Wednesday Aug 25 2018 00:17:23 EDT Ventricular Rate:  90 PR Interval:    QRS Duration: 172 QT Interval:  464 QTC Calculation: 568  R Axis:   -79 Text Interpretation:  Sinus rhythm Left bundle branch block No significant change since last tracing Confirmed by Thayer Jew [phone]) on 08/25/2018 12:50:30 AM   Radiology Dg Chest Portable 1 View  Result Date: 08/25/2018 CLINICAL DATA:  73 y/o F; hypoxia, nausea, weakness, and hypothermia. EXAM: PORTABLE CHEST 1 VIEW COMPARISON:  11/30/2017 chest radiograph FINDINGS: Stable cardiomegaly given projection and technique. Status post CABG. Mitral annular calcification. Sternotomy wires are aligned. Aortic atherosclerosis with calcification. Reticular and peripheral linear opacities greatest at the lung bases. Probable small right effusion. No pneumothorax. No pleural effusion or pneumothorax. No acute osseous abnormality is evident. IMPRESSION: Stable cardiomegaly. Interstitial pulmonary edema. Probable small right effusion. Electronically Signed   By:  Kristine Garbe M.D.   On: 08/25/2018 02:43    Procedures Procedures (including critical care time)  CRITICAL CARE Performed by: Merryl Hacker   Total critical care time: 31 minutes  Critical care time was exclusive of separately billable procedures and treating other patients.  Critical care was necessary to treat or prevent imminent or life-threatening deterioration.  Critical care was time spent personally by me on the following activities: development of treatment plan with patient and/or surrogate as well as nursing, discussions with consultants, evaluation of patient's response to treatment, examination of patient, obtaining history from patient or surrogate, ordering and performing treatments and interventions, ordering and review of laboratory studies, ordering and review of radiographic studies, pulse oximetry and re-evaluation of patient's condition.   Medications Ordered in ED Medications  ondansetron (ZOFRAN) injection 4 mg (4 mg Intravenous Given 08/25/18 0046)     Initial Impression / Assessment and Plan / ED Course  I have reviewed the triage vital signs and the nursing notes.  Pertinent labs & imaging results that were available during my care of the patient were reviewed by me and considered in my medical decision making (see chart for details).  Clinical Course as of Aug 24 645  Wed Aug 25, 2018  0623 On multiple rechecks, patient reports that she feels much more comfortable.  Her temperature has normalized and multiple rechecks of her blood sugar have been normal.  She will be given an oral challenge.  If she is able to tolerate food without difficulty and maintain her blood sugar, feel she can be discharged home.   [CH]    Clinical Course User Index [CH] , Barbette Hair, MD       Patient presents with hypoglycemia.  She is overall nontoxic on exam.  Vital signs notable for hypothermia with a temperature of 92.  This is likely related to her  hypoglycemia.  Patient does not believe she missed took her insulin; however, she is not sure.  She received 2 doses of D10 by EMS.  Repeat blood sugars here have been stable without further intervention.  Given her hypothermia, infectious work-up was initiated.  Chest x-ray shows some pulmonary edema but no pneumonia.  Lab work-up is at the patient's baseline.  On multiple rechecks, she feels much more comfortable.  Temperature has normalized.  Repeat blood sugars over the course of 6 hours have been stable.  She is now tolerating food and fluids without difficulty.  Will discharge patient home.  Recommend closely monitoring blood sugars.  Take insulin as directed.  Eat frequent small meals.  Final Clinical Impressions(s) / ED Diagnoses   Final diagnoses:  Hypoglycemia    ED Discharge Orders    None       , Loma Sousa  F, MD 08/25/18 [phone]

## 2018-08-26 ENCOUNTER — Ambulatory Visit (INDEPENDENT_AMBULATORY_CARE_PROVIDER_SITE_OTHER): Payer: Medicare Other | Admitting: Podiatry

## 2018-08-26 ENCOUNTER — Encounter: Payer: Self-pay | Admitting: Podiatry

## 2018-08-26 ENCOUNTER — Other Ambulatory Visit: Payer: Self-pay

## 2018-08-26 VITALS — Temp 97.9°F

## 2018-08-26 DIAGNOSIS — D631 Anemia in chronic kidney disease: Secondary | ICD-10-CM | POA: Diagnosis not present

## 2018-08-26 DIAGNOSIS — L97512 Non-pressure chronic ulcer of 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d: Secondary | ICD-10-CM

## 2018-08-26 DIAGNOSIS — E1149 Type 2 diabetes mellitus with other diabetic neurological complication: Secondary | ICD-10-CM | POA: Diagnosis not present

## 2018-08-26 DIAGNOSIS — I25810 Atherosclerosis of coronary artery bypass graft(s) without angina pectoris: Secondary | ICD-10-CM

## 2018-08-26 DIAGNOSIS — E11621 Type 2 diabetes mellitus with foot ulcer: Secondary | ICD-10-CM | POA: Diagnosis not present

## 2018-08-26 DIAGNOSIS — L97529 Non-pressure chronic ulcer of other part of left foot with unspecified severity: Secondary | ICD-10-CM

## 2018-08-26 DIAGNOSIS — K769 Liver disease, unspecified: Secondary | ICD-10-CM | POA: Diagnosis not present

## 2018-08-26 DIAGNOSIS — Z79899 Other long term (current) drug therapy: Secondary | ICD-10-CM | POA: Diagnosis not present

## 2018-08-26 DIAGNOSIS — E878 Other disorders of electrolyte and fluid balance, not elsewhere classified: Secondary | ICD-10-CM | POA: Diagnosis not present

## 2018-08-26 DIAGNOSIS — N186 End stage renal disease: Secondary | ICD-10-CM | POA: Diagnosis not present

## 2018-08-26 NOTE — Progress Notes (Signed)
Subjective: 73 year old female presents the office today for evaluation of a wound on the right foot and for a pre-ulcerative area on the left foot.  She states the area on the right arch is "doing OK".  She denies any drainage or pus or any increase in swelling or redness.  She is been using a collagen dressing.  On the left side she states that the area started the callus over.  Again denies any drainage or pus or swelling.  She has no new concerns. Denies any fevers, chills, nausea, vomiting.  No calf pain, chest pain, shortness of breath  Objective: AAO x3, NAD Neurovascular status unchanged.   On the plantar aspect of the right foot on the medial midfoot is an ulceration measuring 0.8 x 0.3 x 0.3 cm after debridement.  She has gone back to using the collagen dressing because she felt that the honey dressing was not helping.  Overall appears to be healthier today and is more granular.  There is no surrounding erythema, ascending cellulitis.  No fluctuation crepitation any malodor. On the left foot submetatarsal 1 is a hyperkeratotic lesion with a central skin fissure area measuring 0.4 x 0.1 cm.  No probing, undermining or tunneling.  No surrounding erythema ascending cellulitis.  No fluctuation crepitation.  No malodor. No pain with calf compression, swelling, warmth, erythema  Assessment: Bilateral ulcerations without signs of infection  Plan: -All treatment options discussed with the patient including all alternatives, risks, complications.  -Sharply debrided the wound on the right side without any complications or bleeding utilized #312 blade scalpel down to healthy, granular tissue.  Continue with collagen dressing changes. -I debrided the callus and wound on the left foot submetatarsal utilizing a #312 blade.  Superficial area of skin breakdown identified today.  Recommended neurotic ointment dressing changes daily. -Offloading at all times. -Continue daily dressing changes  -Monitor for  any clinical signs or symptoms of infection and directed to call the office immediately should any occur or go to the ER.  -She is still been wearing a surgical shoes. She is in a bring in tennis shoes that she wears as well as a diabetic insert to see if he can modify this to help offload.  [name] DPM

## 2018-08-27 DIAGNOSIS — E878 Other disorders of electrolyte and fluid balance, not elsewhere classified: Secondary | ICD-10-CM | POA: Diagnosis not present

## 2018-08-27 DIAGNOSIS — N186 End stage renal disease: Secondary | ICD-10-CM | POA: Diagnosis not present

## 2018-08-27 DIAGNOSIS — D631 Anemia in chronic kidney disease: Secondary | ICD-10-CM | POA: Diagnosis not present

## 2018-08-27 DIAGNOSIS — Z79899 Other long term (current) drug therapy: Secondary | ICD-10-CM | POA: Diagnosis not present

## 2018-08-27 DIAGNOSIS — K769 Liver disease, unspecified: Secondary | ICD-10-CM | POA: Diagnosis not present

## 2018-08-28 DIAGNOSIS — Z79899 Other long term (current) drug therapy: Secondary | ICD-10-CM | POA: Diagnosis not present

## 2018-08-28 DIAGNOSIS — K769 Liver disease, unspecified: Secondary | ICD-10-CM | POA: Diagnosis not present

## 2018-08-28 DIAGNOSIS — E878 Other disorders of electrolyte and fluid balance, not elsewhere classified: Secondary | ICD-10-CM | POA: Diagnosis not present

## 2018-08-28 DIAGNOSIS — N186 End stage renal disease: Secondary | ICD-10-CM | POA: Diagnosis not present

## 2018-08-28 DIAGNOSIS — D631 Anemia in chronic kidney disease: Secondary | ICD-10-CM | POA: Diagnosis not present

## 2018-08-29 DIAGNOSIS — Z79899 Other long term (current) drug therapy: Secondary | ICD-10-CM | POA: Diagnosis not present

## 2018-08-29 DIAGNOSIS — D631 Anemia in chronic kidney disease: Secondary | ICD-10-CM | POA: Diagnosis not present

## 2018-08-29 DIAGNOSIS — N186 End stage renal disease: Secondary | ICD-10-CM | POA: Diagnosis not present

## 2018-08-29 DIAGNOSIS — K769 Liver disease, unspecified: Secondary | ICD-10-CM | POA: Diagnosis not present

## 2018-08-29 DIAGNOSIS — E878 Other disorders of electrolyte and fluid balance, not elsewhere classified: Secondary | ICD-10-CM | POA: Diagnosis not present

## 2018-08-30 DIAGNOSIS — K769 Liver disease, unspecified: Secondary | ICD-10-CM | POA: Diagnosis not present

## 2018-08-30 DIAGNOSIS — Z79899 Other long term (current) drug therapy: Secondary | ICD-10-CM | POA: Diagnosis not present

## 2018-08-30 DIAGNOSIS — E878 Other disorders of electrolyte and fluid balance, not elsewhere classified: Secondary | ICD-10-CM | POA: Diagnosis not present

## 2018-08-30 DIAGNOSIS — D631 Anemia in chronic kidney disease: Secondary | ICD-10-CM | POA: Diagnosis not present

## 2018-08-30 DIAGNOSIS — N186 End stage renal disease: Secondary | ICD-10-CM | POA: Diagnosis not present

## 2018-08-30 LAB — CULTURE, BLOOD (ROUTINE X 2)
Culture: NO GROWTH
Culture: NO GROWTH
Special Requests: ADEQUATE
Special Requests: ADEQUATE

## 2018-08-31 DIAGNOSIS — N186 End stage renal disease: Secondary | ICD-10-CM | POA: Diagnosis not present

## 2018-08-31 DIAGNOSIS — Z79899 Other long term (current) drug therapy: Secondary | ICD-10-CM | POA: Diagnosis not present

## 2018-08-31 DIAGNOSIS — E878 Other disorders of electrolyte and fluid balance, not elsewhere classified: Secondary | ICD-10-CM | POA: Diagnosis not present

## 2018-08-31 DIAGNOSIS — K769 Liver disease, unspecified: Secondary | ICD-10-CM | POA: Diagnosis not present

## 2018-08-31 DIAGNOSIS — D631 Anemia in chronic kidney disease: Secondary | ICD-10-CM | POA: Diagnosis not present

## 2018-09-01 DIAGNOSIS — E878 Other disorders of electrolyte and fluid balance, not elsewhere classified: Secondary | ICD-10-CM | POA: Diagnosis not present

## 2018-09-01 DIAGNOSIS — K769 Liver disease, unspecified: Secondary | ICD-10-CM | POA: Diagnosis not present

## 2018-09-01 DIAGNOSIS — N186 End stage renal disease: Secondary | ICD-10-CM | POA: Diagnosis not present

## 2018-09-01 DIAGNOSIS — Z79899 Other long term (current) drug therapy: Secondary | ICD-10-CM | POA: Diagnosis not present

## 2018-09-01 DIAGNOSIS — D631 Anemia in chronic kidney disease: Secondary | ICD-10-CM | POA: Diagnosis not present

## 2018-09-02 DIAGNOSIS — D631 Anemia in chronic kidney disease: Secondary | ICD-10-CM | POA: Diagnosis not present

## 2018-09-02 DIAGNOSIS — E878 Other disorders of electrolyte and fluid balance, not elsewhere classified: Secondary | ICD-10-CM | POA: Diagnosis not present

## 2018-09-02 DIAGNOSIS — N186 End stage renal disease: Secondary | ICD-10-CM | POA: Diagnosis not present

## 2018-09-02 DIAGNOSIS — Z79899 Other long term (current) drug therapy: Secondary | ICD-10-CM | POA: Diagnosis not present

## 2018-09-02 DIAGNOSIS — K769 Liver disease, unspecified: Secondary | ICD-10-CM | POA: Diagnosis not present

## 2018-09-03 DIAGNOSIS — R52 Pain, unspecified: Secondary | ICD-10-CM | POA: Insufficient documentation

## 2018-09-03 DIAGNOSIS — K769 Liver disease, unspecified: Secondary | ICD-10-CM | POA: Diagnosis not present

## 2018-09-03 DIAGNOSIS — N186 End stage renal disease: Secondary | ICD-10-CM | POA: Diagnosis not present

## 2018-09-03 DIAGNOSIS — D509 Iron deficiency anemia, unspecified: Secondary | ICD-10-CM | POA: Insufficient documentation

## 2018-09-03 DIAGNOSIS — Z79899 Other long term (current) drug therapy: Secondary | ICD-10-CM | POA: Diagnosis not present

## 2018-09-03 DIAGNOSIS — N2581 Secondary hyperparathyroidism of renal origin: Secondary | ICD-10-CM | POA: Insufficient documentation

## 2018-09-03 DIAGNOSIS — E878 Other disorders of electrolyte and fluid balance, not elsewhere classified: Secondary | ICD-10-CM | POA: Diagnosis not present

## 2018-09-03 DIAGNOSIS — N189 Chronic kidney disease, unspecified: Secondary | ICD-10-CM | POA: Insufficient documentation

## 2018-09-03 DIAGNOSIS — D631 Anemia in chronic kidney disease: Secondary | ICD-10-CM | POA: Insufficient documentation

## 2018-09-03 DIAGNOSIS — D689 Coagulation defect, unspecified: Secondary | ICD-10-CM | POA: Insufficient documentation

## 2018-09-04 DIAGNOSIS — E878 Other disorders of electrolyte and fluid balance, not elsewhere classified: Secondary | ICD-10-CM | POA: Diagnosis not present

## 2018-09-04 DIAGNOSIS — Z79899 Other long term (current) drug therapy: Secondary | ICD-10-CM | POA: Diagnosis not present

## 2018-09-04 DIAGNOSIS — D631 Anemia in chronic kidney disease: Secondary | ICD-10-CM | POA: Diagnosis not present

## 2018-09-04 DIAGNOSIS — N186 End stage renal disease: Secondary | ICD-10-CM | POA: Diagnosis not present

## 2018-09-04 DIAGNOSIS — K769 Liver disease, unspecified: Secondary | ICD-10-CM | POA: Diagnosis not present

## 2018-09-05 DIAGNOSIS — Z79899 Other long term (current) drug therapy: Secondary | ICD-10-CM | POA: Diagnosis not present

## 2018-09-05 DIAGNOSIS — E878 Other disorders of electrolyte and fluid balance, not elsewhere classified: Secondary | ICD-10-CM | POA: Diagnosis not present

## 2018-09-05 DIAGNOSIS — K769 Liver disease, unspecified: Secondary | ICD-10-CM | POA: Diagnosis not present

## 2018-09-05 DIAGNOSIS — D631 Anemia in chronic kidney disease: Secondary | ICD-10-CM | POA: Diagnosis not present

## 2018-09-05 DIAGNOSIS — N186 End stage renal disease: Secondary | ICD-10-CM | POA: Diagnosis not present

## 2018-09-06 DIAGNOSIS — Z992 Dependence on renal dialysis: Secondary | ICD-10-CM | POA: Diagnosis not present

## 2018-09-06 DIAGNOSIS — E441 Mild protein-calorie malnutrition: Secondary | ICD-10-CM | POA: Insufficient documentation

## 2018-09-06 DIAGNOSIS — N186 End stage renal disease: Secondary | ICD-10-CM | POA: Diagnosis not present

## 2018-09-06 DIAGNOSIS — N2581 Secondary hyperparathyroidism of renal origin: Secondary | ICD-10-CM | POA: Diagnosis not present

## 2018-09-06 DIAGNOSIS — E1122 Type 2 diabetes mellitus with diabetic chronic kidney disease: Secondary | ICD-10-CM | POA: Diagnosis not present

## 2018-09-07 ENCOUNTER — Encounter: Payer: Self-pay | Admitting: Internal Medicine

## 2018-09-07 DIAGNOSIS — N2581 Secondary hyperparathyroidism of renal origin: Secondary | ICD-10-CM | POA: Diagnosis not present

## 2018-09-07 DIAGNOSIS — N186 End stage renal disease: Secondary | ICD-10-CM | POA: Diagnosis not present

## 2018-09-08 DIAGNOSIS — N2581 Secondary hyperparathyroidism of renal origin: Secondary | ICD-10-CM | POA: Diagnosis not present

## 2018-09-08 DIAGNOSIS — N186 End stage renal disease: Secondary | ICD-10-CM | POA: Diagnosis not present

## 2018-09-09 DIAGNOSIS — N2581 Secondary hyperparathyroidism of renal origin: Secondary | ICD-10-CM | POA: Diagnosis not present

## 2018-09-09 DIAGNOSIS — N186 End stage renal disease: Secondary | ICD-10-CM | POA: Diagnosis not present

## 2018-09-10 DIAGNOSIS — N186 End stage renal disease: Secondary | ICD-10-CM | POA: Diagnosis not present

## 2018-09-10 DIAGNOSIS — N2581 Secondary hyperparathyroidism of renal origin: Secondary | ICD-10-CM | POA: Diagnosis not present

## 2018-09-11 DIAGNOSIS — N186 End stage renal disease: Secondary | ICD-10-CM | POA: Diagnosis not present

## 2018-09-11 DIAGNOSIS — N2581 Secondary hyperparathyroidism of renal origin: Secondary | ICD-10-CM | POA: Diagnosis not present

## 2018-09-12 DIAGNOSIS — N186 End stage renal disease: Secondary | ICD-10-CM | POA: Diagnosis not present

## 2018-09-12 DIAGNOSIS — N2581 Secondary hyperparathyroidism of renal origin: Secondary | ICD-10-CM | POA: Diagnosis not present

## 2018-09-13 DIAGNOSIS — N2581 Secondary hyperparathyroidism of renal origin: Secondary | ICD-10-CM | POA: Diagnosis not present

## 2018-09-13 DIAGNOSIS — N186 End stage renal disease: Secondary | ICD-10-CM | POA: Diagnosis not present

## 2018-09-14 ENCOUNTER — Ambulatory Visit: Payer: Medicare Other | Admitting: Podiatry

## 2018-09-14 ENCOUNTER — Other Ambulatory Visit: Payer: Medicare Other | Admitting: Orthotics

## 2018-09-14 DIAGNOSIS — N186 End stage renal disease: Secondary | ICD-10-CM | POA: Diagnosis not present

## 2018-09-14 DIAGNOSIS — N2581 Secondary hyperparathyroidism of renal origin: Secondary | ICD-10-CM | POA: Diagnosis not present

## 2018-09-15 DIAGNOSIS — N2581 Secondary hyperparathyroidism of renal origin: Secondary | ICD-10-CM | POA: Diagnosis not present

## 2018-09-15 DIAGNOSIS — N186 End stage renal disease: Secondary | ICD-10-CM | POA: Diagnosis not present

## 2018-09-16 DIAGNOSIS — N2581 Secondary hyperparathyroidism of renal origin: Secondary | ICD-10-CM | POA: Diagnosis not present

## 2018-09-16 DIAGNOSIS — N186 End stage renal disease: Secondary | ICD-10-CM | POA: Diagnosis not present

## 2018-09-17 DIAGNOSIS — N2581 Secondary hyperparathyroidism of renal origin: Secondary | ICD-10-CM | POA: Diagnosis not present

## 2018-09-17 DIAGNOSIS — N186 End stage renal disease: Secondary | ICD-10-CM | POA: Diagnosis not present

## 2018-09-18 DIAGNOSIS — N186 End stage renal disease: Secondary | ICD-10-CM | POA: Diagnosis not present

## 2018-09-18 DIAGNOSIS — N2581 Secondary hyperparathyroidism of renal origin: Secondary | ICD-10-CM | POA: Diagnosis not present

## 2018-09-19 DIAGNOSIS — N2581 Secondary hyperparathyroidism of renal origin: Secondary | ICD-10-CM | POA: Diagnosis not present

## 2018-09-19 DIAGNOSIS — N186 End stage renal disease: Secondary | ICD-10-CM | POA: Diagnosis not present

## 2018-09-20 DIAGNOSIS — N2581 Secondary hyperparathyroidism of renal origin: Secondary | ICD-10-CM | POA: Diagnosis not present

## 2018-09-20 DIAGNOSIS — N186 End stage renal disease: Secondary | ICD-10-CM | POA: Diagnosis not present

## 2018-09-21 DIAGNOSIS — N2581 Secondary hyperparathyroidism of renal origin: Secondary | ICD-10-CM | POA: Diagnosis not present

## 2018-09-21 DIAGNOSIS — N186 End stage renal disease: Secondary | ICD-10-CM | POA: Diagnosis not present

## 2018-09-22 DIAGNOSIS — N2581 Secondary hyperparathyroidism of renal origin: Secondary | ICD-10-CM | POA: Diagnosis not present

## 2018-09-22 DIAGNOSIS — N186 End stage renal disease: Secondary | ICD-10-CM | POA: Diagnosis not present

## 2018-09-23 DIAGNOSIS — N2581 Secondary hyperparathyroidism of renal origin: Secondary | ICD-10-CM | POA: Diagnosis not present

## 2018-09-23 DIAGNOSIS — N186 End stage renal disease: Secondary | ICD-10-CM | POA: Diagnosis not present

## 2018-09-24 DIAGNOSIS — N186 End stage renal disease: Secondary | ICD-10-CM | POA: Diagnosis not present

## 2018-09-24 DIAGNOSIS — N2581 Secondary hyperparathyroidism of renal origin: Secondary | ICD-10-CM | POA: Diagnosis not present

## 2018-09-25 DIAGNOSIS — N186 End stage renal disease: Secondary | ICD-10-CM | POA: Diagnosis not present

## 2018-09-25 DIAGNOSIS — N2581 Secondary hyperparathyroidism of renal origin: Secondary | ICD-10-CM | POA: Diagnosis not present

## 2018-09-26 DIAGNOSIS — N2581 Secondary hyperparathyroidism of renal origin: Secondary | ICD-10-CM | POA: Diagnosis not present

## 2018-09-26 DIAGNOSIS — N186 End stage renal disease: Secondary | ICD-10-CM | POA: Diagnosis not present

## 2018-09-27 DIAGNOSIS — N186 End stage renal disease: Secondary | ICD-10-CM | POA: Diagnosis not present

## 2018-09-27 DIAGNOSIS — N2581 Secondary hyperparathyroidism of renal origin: Secondary | ICD-10-CM | POA: Diagnosis not present

## 2018-09-28 DIAGNOSIS — N186 End stage renal disease: Secondary | ICD-10-CM | POA: Diagnosis not present

## 2018-09-28 DIAGNOSIS — N2581 Secondary hyperparathyroidism of renal origin: Secondary | ICD-10-CM | POA: Diagnosis not present

## 2018-09-29 DIAGNOSIS — N2581 Secondary hyperparathyroidism of renal origin: Secondary | ICD-10-CM | POA: Diagnosis not present

## 2018-09-29 DIAGNOSIS — N186 End stage renal disease: Secondary | ICD-10-CM | POA: Diagnosis not present

## 2018-10-06 DIAGNOSIS — N186 End stage renal disease: Secondary | ICD-10-CM | POA: Diagnosis not present

## 2018-10-06 DIAGNOSIS — E1122 Type 2 diabetes mellitus with diabetic chronic kidney disease: Secondary | ICD-10-CM | POA: Diagnosis not present

## 2018-10-06 DIAGNOSIS — Z992 Dependence on renal dialysis: Secondary | ICD-10-CM | POA: Diagnosis not present

## 2018-10-07 ENCOUNTER — Encounter: Payer: Self-pay | Admitting: Internal Medicine

## 2018-10-07 DIAGNOSIS — N186 End stage renal disease: Secondary | ICD-10-CM | POA: Diagnosis not present

## 2018-10-07 DIAGNOSIS — E119 Type 2 diabetes mellitus without complications: Secondary | ICD-10-CM | POA: Diagnosis not present

## 2018-10-07 DIAGNOSIS — D631 Anemia in chronic kidney disease: Secondary | ICD-10-CM | POA: Diagnosis not present

## 2018-10-07 DIAGNOSIS — N2581 Secondary hyperparathyroidism of renal origin: Secondary | ICD-10-CM | POA: Diagnosis not present

## 2018-10-07 DIAGNOSIS — D509 Iron deficiency anemia, unspecified: Secondary | ICD-10-CM | POA: Diagnosis not present

## 2018-10-09 DIAGNOSIS — D509 Iron deficiency anemia, unspecified: Secondary | ICD-10-CM | POA: Diagnosis not present

## 2018-10-09 DIAGNOSIS — E119 Type 2 diabetes mellitus without complications: Secondary | ICD-10-CM | POA: Diagnosis not present

## 2018-10-09 DIAGNOSIS — N186 End stage renal disease: Secondary | ICD-10-CM | POA: Diagnosis not present

## 2018-10-09 DIAGNOSIS — N2581 Secondary hyperparathyroidism of renal origin: Secondary | ICD-10-CM | POA: Diagnosis not present

## 2018-10-09 DIAGNOSIS — D631 Anemia in chronic kidney disease: Secondary | ICD-10-CM | POA: Diagnosis not present

## 2018-10-12 DIAGNOSIS — N186 End stage renal disease: Secondary | ICD-10-CM | POA: Diagnosis not present

## 2018-10-12 DIAGNOSIS — D509 Iron deficiency anemia, unspecified: Secondary | ICD-10-CM | POA: Diagnosis not present

## 2018-10-12 DIAGNOSIS — E119 Type 2 diabetes mellitus without complications: Secondary | ICD-10-CM | POA: Diagnosis not present

## 2018-10-12 DIAGNOSIS — N2581 Secondary hyperparathyroidism of renal origin: Secondary | ICD-10-CM | POA: Diagnosis not present

## 2018-10-12 DIAGNOSIS — D631 Anemia in chronic kidney disease: Secondary | ICD-10-CM | POA: Diagnosis not present

## 2018-10-14 DIAGNOSIS — N186 End stage renal disease: Secondary | ICD-10-CM | POA: Diagnosis not present

## 2018-10-14 DIAGNOSIS — N2581 Secondary hyperparathyroidism of renal origin: Secondary | ICD-10-CM | POA: Diagnosis not present

## 2018-10-14 DIAGNOSIS — D631 Anemia in chronic kidney disease: Secondary | ICD-10-CM | POA: Diagnosis not present

## 2018-10-14 DIAGNOSIS — E119 Type 2 diabetes mellitus without complications: Secondary | ICD-10-CM | POA: Diagnosis not present

## 2018-10-14 DIAGNOSIS — D509 Iron deficiency anemia, unspecified: Secondary | ICD-10-CM | POA: Diagnosis not present

## 2018-10-16 DIAGNOSIS — N186 End stage renal disease: Secondary | ICD-10-CM | POA: Diagnosis not present

## 2018-10-16 DIAGNOSIS — N2581 Secondary hyperparathyroidism of renal origin: Secondary | ICD-10-CM | POA: Diagnosis not present

## 2018-10-16 DIAGNOSIS — D631 Anemia in chronic kidney disease: Secondary | ICD-10-CM | POA: Diagnosis not present

## 2018-10-16 DIAGNOSIS — E119 Type 2 diabetes mellitus without complications: Secondary | ICD-10-CM | POA: Diagnosis not present

## 2018-10-16 DIAGNOSIS — D509 Iron deficiency anemia, unspecified: Secondary | ICD-10-CM | POA: Diagnosis not present

## 2018-10-19 DIAGNOSIS — D509 Iron deficiency anemia, unspecified: Secondary | ICD-10-CM | POA: Diagnosis not present

## 2018-10-19 DIAGNOSIS — N2581 Secondary hyperparathyroidism of renal origin: Secondary | ICD-10-CM | POA: Diagnosis not present

## 2018-10-19 DIAGNOSIS — E119 Type 2 diabetes mellitus without complications: Secondary | ICD-10-CM | POA: Diagnosis not present

## 2018-10-19 DIAGNOSIS — D631 Anemia in chronic kidney disease: Secondary | ICD-10-CM | POA: Diagnosis not present

## 2018-10-19 DIAGNOSIS — N186 End stage renal disease: Secondary | ICD-10-CM | POA: Diagnosis not present

## 2018-10-21 DIAGNOSIS — N2581 Secondary hyperparathyroidism of renal origin: Secondary | ICD-10-CM | POA: Diagnosis not present

## 2018-10-21 DIAGNOSIS — D509 Iron deficiency anemia, unspecified: Secondary | ICD-10-CM | POA: Diagnosis not present

## 2018-10-21 DIAGNOSIS — D631 Anemia in chronic kidney disease: Secondary | ICD-10-CM | POA: Diagnosis not present

## 2018-10-21 DIAGNOSIS — E119 Type 2 diabetes mellitus without complications: Secondary | ICD-10-CM | POA: Diagnosis not present

## 2018-10-21 DIAGNOSIS — N186 End stage renal disease: Secondary | ICD-10-CM | POA: Diagnosis not present

## 2018-10-22 ENCOUNTER — Other Ambulatory Visit: Payer: Self-pay | Admitting: Internal Medicine

## 2018-10-23 DIAGNOSIS — E119 Type 2 diabetes mellitus without complications: Secondary | ICD-10-CM | POA: Diagnosis not present

## 2018-10-23 DIAGNOSIS — N186 End stage renal disease: Secondary | ICD-10-CM | POA: Diagnosis not present

## 2018-10-23 DIAGNOSIS — D509 Iron deficiency anemia, unspecified: Secondary | ICD-10-CM | POA: Diagnosis not present

## 2018-10-23 DIAGNOSIS — D631 Anemia in chronic kidney disease: Secondary | ICD-10-CM | POA: Diagnosis not present

## 2018-10-23 DIAGNOSIS — N2581 Secondary hyperparathyroidism of renal origin: Secondary | ICD-10-CM | POA: Diagnosis not present

## 2018-10-25 DIAGNOSIS — I120 Hypertensive chronic kidney disease with stage 5 chronic kidney disease or end stage renal disease: Secondary | ICD-10-CM | POA: Diagnosis not present

## 2018-10-25 DIAGNOSIS — N186 End stage renal disease: Secondary | ICD-10-CM | POA: Diagnosis not present

## 2018-10-26 DIAGNOSIS — E119 Type 2 diabetes mellitus without complications: Secondary | ICD-10-CM | POA: Diagnosis not present

## 2018-10-26 DIAGNOSIS — N2581 Secondary hyperparathyroidism of renal origin: Secondary | ICD-10-CM | POA: Diagnosis not present

## 2018-10-26 DIAGNOSIS — D631 Anemia in chronic kidney disease: Secondary | ICD-10-CM | POA: Diagnosis not present

## 2018-10-26 DIAGNOSIS — D509 Iron deficiency anemia, unspecified: Secondary | ICD-10-CM | POA: Diagnosis not present

## 2018-10-26 DIAGNOSIS — N186 End stage renal disease: Secondary | ICD-10-CM | POA: Diagnosis not present

## 2018-10-27 ENCOUNTER — Other Ambulatory Visit: Payer: Self-pay | Admitting: Internal Medicine

## 2018-10-27 DIAGNOSIS — I10 Essential (primary) hypertension: Secondary | ICD-10-CM

## 2018-10-27 DIAGNOSIS — I25810 Atherosclerosis of coronary artery bypass graft(s) without angina pectoris: Secondary | ICD-10-CM

## 2018-10-27 DIAGNOSIS — M109 Gout, unspecified: Secondary | ICD-10-CM

## 2018-10-27 MED ORDER — HYDRALAZINE HCL 25 MG PO TABS: ORAL_TABLET | ORAL | 3 refills | Status: DC

## 2018-10-27 MED ORDER — ALLOPURINOL 100 MG PO TABS: ORAL_TABLET | ORAL | 3 refills | Status: DC

## 2018-10-27 MED ORDER — ISOSORBIDE DINITRATE 10 MG PO TABS: ORAL_TABLET | ORAL | 3 refills | Status: DC

## 2018-10-28 DIAGNOSIS — N2581 Secondary hyperparathyroidism of renal origin: Secondary | ICD-10-CM | POA: Diagnosis not present

## 2018-10-28 DIAGNOSIS — D631 Anemia in chronic kidney disease: Secondary | ICD-10-CM | POA: Diagnosis not present

## 2018-10-28 DIAGNOSIS — D509 Iron deficiency anemia, unspecified: Secondary | ICD-10-CM | POA: Diagnosis not present

## 2018-10-28 DIAGNOSIS — E119 Type 2 diabetes mellitus without complications: Secondary | ICD-10-CM | POA: Diagnosis not present

## 2018-10-28 DIAGNOSIS — N186 End stage renal disease: Secondary | ICD-10-CM | POA: Diagnosis not present

## 2018-10-30 DIAGNOSIS — N186 End stage renal disease: Secondary | ICD-10-CM | POA: Diagnosis not present

## 2018-10-30 DIAGNOSIS — D509 Iron deficiency anemia, unspecified: Secondary | ICD-10-CM | POA: Diagnosis not present

## 2018-10-30 DIAGNOSIS — E119 Type 2 diabetes mellitus without complications: Secondary | ICD-10-CM | POA: Diagnosis not present

## 2018-10-30 DIAGNOSIS — N2581 Secondary hyperparathyroidism of renal origin: Secondary | ICD-10-CM | POA: Diagnosis not present

## 2018-10-30 DIAGNOSIS — D631 Anemia in chronic kidney disease: Secondary | ICD-10-CM | POA: Diagnosis not present

## 2018-11-02 DIAGNOSIS — E119 Type 2 diabetes mellitus without complications: Secondary | ICD-10-CM | POA: Diagnosis not present

## 2018-11-02 DIAGNOSIS — N186 End stage renal disease: Secondary | ICD-10-CM | POA: Diagnosis not present

## 2018-11-02 DIAGNOSIS — D509 Iron deficiency anemia, unspecified: Secondary | ICD-10-CM | POA: Diagnosis not present

## 2018-11-02 DIAGNOSIS — N2581 Secondary hyperparathyroidism of renal origin: Secondary | ICD-10-CM | POA: Diagnosis not present

## 2018-11-02 DIAGNOSIS — D631 Anemia in chronic kidney disease: Secondary | ICD-10-CM | POA: Diagnosis not present

## 2018-11-03 DIAGNOSIS — I120 Hypertensive chronic kidney disease with stage 5 chronic kidney disease or end stage renal disease: Secondary | ICD-10-CM | POA: Diagnosis not present

## 2018-11-03 DIAGNOSIS — Z20828 Contact with and (suspected) exposure to other viral communicable diseases: Secondary | ICD-10-CM | POA: Diagnosis not present

## 2018-11-03 DIAGNOSIS — N186 End stage renal disease: Secondary | ICD-10-CM | POA: Diagnosis not present

## 2018-11-03 DIAGNOSIS — Z01812 Encounter for preprocedural laboratory examination: Secondary | ICD-10-CM | POA: Diagnosis not present

## 2018-11-03 DIAGNOSIS — Z1159 Encounter for screening for other viral diseases: Secondary | ICD-10-CM | POA: Diagnosis not present

## 2018-11-04 DIAGNOSIS — D509 Iron deficiency anemia, unspecified: Secondary | ICD-10-CM | POA: Diagnosis not present

## 2018-11-04 DIAGNOSIS — E119 Type 2 diabetes mellitus without complications: Secondary | ICD-10-CM | POA: Diagnosis not present

## 2018-11-04 DIAGNOSIS — N186 End stage renal disease: Secondary | ICD-10-CM | POA: Diagnosis not present

## 2018-11-04 DIAGNOSIS — D631 Anemia in chronic kidney disease: Secondary | ICD-10-CM | POA: Diagnosis not present

## 2018-11-04 DIAGNOSIS — N2581 Secondary hyperparathyroidism of renal origin: Secondary | ICD-10-CM | POA: Diagnosis not present

## 2018-11-05 DIAGNOSIS — T82858A Stenosis of vascular prosthetic devices, implants and grafts, initial encounter: Secondary | ICD-10-CM | POA: Diagnosis not present

## 2018-11-05 DIAGNOSIS — I871 Compression of vein: Secondary | ICD-10-CM | POA: Diagnosis not present

## 2018-11-05 DIAGNOSIS — Z992 Dependence on renal dialysis: Secondary | ICD-10-CM | POA: Diagnosis not present

## 2018-11-05 DIAGNOSIS — N186 End stage renal disease: Secondary | ICD-10-CM | POA: Diagnosis not present

## 2018-11-06 DIAGNOSIS — D631 Anemia in chronic kidney disease: Secondary | ICD-10-CM | POA: Diagnosis not present

## 2018-11-06 DIAGNOSIS — N2581 Secondary hyperparathyroidism of renal origin: Secondary | ICD-10-CM | POA: Diagnosis not present

## 2018-11-06 DIAGNOSIS — N186 End stage renal disease: Secondary | ICD-10-CM | POA: Diagnosis not present

## 2018-11-06 DIAGNOSIS — D509 Iron deficiency anemia, unspecified: Secondary | ICD-10-CM | POA: Diagnosis not present

## 2018-11-06 DIAGNOSIS — E1122 Type 2 diabetes mellitus with diabetic chronic kidney disease: Secondary | ICD-10-CM | POA: Diagnosis not present

## 2018-11-06 DIAGNOSIS — Z992 Dependence on renal dialysis: Secondary | ICD-10-CM | POA: Diagnosis not present

## 2018-11-08 DIAGNOSIS — Z992 Dependence on renal dialysis: Secondary | ICD-10-CM | POA: Diagnosis not present

## 2018-11-08 DIAGNOSIS — N186 End stage renal disease: Secondary | ICD-10-CM | POA: Diagnosis not present

## 2018-11-08 DIAGNOSIS — N2581 Secondary hyperparathyroidism of renal origin: Secondary | ICD-10-CM | POA: Diagnosis not present

## 2018-11-08 DIAGNOSIS — D631 Anemia in chronic kidney disease: Secondary | ICD-10-CM | POA: Diagnosis not present

## 2018-11-08 DIAGNOSIS — D509 Iron deficiency anemia, unspecified: Secondary | ICD-10-CM | POA: Diagnosis not present

## 2018-11-09 DIAGNOSIS — E1122 Type 2 diabetes mellitus with diabetic chronic kidney disease: Secondary | ICD-10-CM | POA: Diagnosis not present

## 2018-11-09 DIAGNOSIS — N186 End stage renal disease: Secondary | ICD-10-CM | POA: Diagnosis not present

## 2018-11-09 DIAGNOSIS — Z794 Long term (current) use of insulin: Secondary | ICD-10-CM | POA: Diagnosis not present

## 2018-11-09 DIAGNOSIS — Z992 Dependence on renal dialysis: Secondary | ICD-10-CM | POA: Diagnosis not present

## 2018-11-09 DIAGNOSIS — Z951 Presence of aortocoronary bypass graft: Secondary | ICD-10-CM | POA: Diagnosis not present

## 2018-11-09 DIAGNOSIS — I120 Hypertensive chronic kidney disease with stage 5 chronic kidney disease or end stage renal disease: Secondary | ICD-10-CM | POA: Diagnosis not present

## 2018-11-09 DIAGNOSIS — I2510 Atherosclerotic heart disease of native coronary artery without angina pectoris: Secondary | ICD-10-CM | POA: Diagnosis not present

## 2018-11-09 DIAGNOSIS — Z4902 Encounter for fitting and adjustment of peritoneal dialysis catheter: Secondary | ICD-10-CM | POA: Diagnosis not present

## 2018-11-11 DIAGNOSIS — N2581 Secondary hyperparathyroidism of renal origin: Secondary | ICD-10-CM | POA: Diagnosis not present

## 2018-11-11 DIAGNOSIS — Z992 Dependence on renal dialysis: Secondary | ICD-10-CM | POA: Diagnosis not present

## 2018-11-11 DIAGNOSIS — D509 Iron deficiency anemia, unspecified: Secondary | ICD-10-CM | POA: Diagnosis not present

## 2018-11-11 DIAGNOSIS — D631 Anemia in chronic kidney disease: Secondary | ICD-10-CM | POA: Diagnosis not present

## 2018-11-11 DIAGNOSIS — N186 End stage renal disease: Secondary | ICD-10-CM | POA: Diagnosis not present

## 2018-11-13 DIAGNOSIS — N186 End stage renal disease: Secondary | ICD-10-CM | POA: Diagnosis not present

## 2018-11-13 DIAGNOSIS — Z992 Dependence on renal dialysis: Secondary | ICD-10-CM | POA: Diagnosis not present

## 2018-11-13 DIAGNOSIS — D509 Iron deficiency anemia, unspecified: Secondary | ICD-10-CM | POA: Diagnosis not present

## 2018-11-13 DIAGNOSIS — D631 Anemia in chronic kidney disease: Secondary | ICD-10-CM | POA: Diagnosis not present

## 2018-11-13 DIAGNOSIS — N2581 Secondary hyperparathyroidism of renal origin: Secondary | ICD-10-CM | POA: Diagnosis not present

## 2018-11-16 DIAGNOSIS — D509 Iron deficiency anemia, unspecified: Secondary | ICD-10-CM | POA: Diagnosis not present

## 2018-11-16 DIAGNOSIS — Z992 Dependence on renal dialysis: Secondary | ICD-10-CM | POA: Diagnosis not present

## 2018-11-16 DIAGNOSIS — N2581 Secondary hyperparathyroidism of renal origin: Secondary | ICD-10-CM | POA: Diagnosis not present

## 2018-11-16 DIAGNOSIS — D631 Anemia in chronic kidney disease: Secondary | ICD-10-CM | POA: Diagnosis not present

## 2018-11-16 DIAGNOSIS — N186 End stage renal disease: Secondary | ICD-10-CM | POA: Diagnosis not present

## 2018-11-18 DIAGNOSIS — D509 Iron deficiency anemia, unspecified: Secondary | ICD-10-CM | POA: Diagnosis not present

## 2018-11-18 DIAGNOSIS — Z992 Dependence on renal dialysis: Secondary | ICD-10-CM | POA: Diagnosis not present

## 2018-11-18 DIAGNOSIS — D631 Anemia in chronic kidney disease: Secondary | ICD-10-CM | POA: Diagnosis not present

## 2018-11-18 DIAGNOSIS — N2581 Secondary hyperparathyroidism of renal origin: Secondary | ICD-10-CM | POA: Diagnosis not present

## 2018-11-18 DIAGNOSIS — N186 End stage renal disease: Secondary | ICD-10-CM | POA: Diagnosis not present

## 2018-11-20 DIAGNOSIS — D509 Iron deficiency anemia, unspecified: Secondary | ICD-10-CM | POA: Diagnosis not present

## 2018-11-20 DIAGNOSIS — N186 End stage renal disease: Secondary | ICD-10-CM | POA: Diagnosis not present

## 2018-11-20 DIAGNOSIS — D631 Anemia in chronic kidney disease: Secondary | ICD-10-CM | POA: Diagnosis not present

## 2018-11-20 DIAGNOSIS — N2581 Secondary hyperparathyroidism of renal origin: Secondary | ICD-10-CM | POA: Diagnosis not present

## 2018-11-20 DIAGNOSIS — Z992 Dependence on renal dialysis: Secondary | ICD-10-CM | POA: Diagnosis not present

## 2018-11-23 DIAGNOSIS — N186 End stage renal disease: Secondary | ICD-10-CM | POA: Diagnosis not present

## 2018-11-23 DIAGNOSIS — Z992 Dependence on renal dialysis: Secondary | ICD-10-CM | POA: Diagnosis not present

## 2018-11-23 DIAGNOSIS — D631 Anemia in chronic kidney disease: Secondary | ICD-10-CM | POA: Diagnosis not present

## 2018-11-23 DIAGNOSIS — D509 Iron deficiency anemia, unspecified: Secondary | ICD-10-CM | POA: Diagnosis not present

## 2018-11-23 DIAGNOSIS — N2581 Secondary hyperparathyroidism of renal origin: Secondary | ICD-10-CM | POA: Diagnosis not present

## 2018-11-25 DIAGNOSIS — N2581 Secondary hyperparathyroidism of renal origin: Secondary | ICD-10-CM | POA: Diagnosis not present

## 2018-11-25 DIAGNOSIS — D631 Anemia in chronic kidney disease: Secondary | ICD-10-CM | POA: Diagnosis not present

## 2018-11-25 DIAGNOSIS — D509 Iron deficiency anemia, unspecified: Secondary | ICD-10-CM | POA: Diagnosis not present

## 2018-11-25 DIAGNOSIS — N186 End stage renal disease: Secondary | ICD-10-CM | POA: Diagnosis not present

## 2018-11-25 DIAGNOSIS — Z992 Dependence on renal dialysis: Secondary | ICD-10-CM | POA: Diagnosis not present

## 2018-11-27 DIAGNOSIS — N2581 Secondary hyperparathyroidism of renal origin: Secondary | ICD-10-CM | POA: Diagnosis not present

## 2018-11-27 DIAGNOSIS — D509 Iron deficiency anemia, unspecified: Secondary | ICD-10-CM | POA: Diagnosis not present

## 2018-11-27 DIAGNOSIS — N186 End stage renal disease: Secondary | ICD-10-CM | POA: Diagnosis not present

## 2018-11-27 DIAGNOSIS — D631 Anemia in chronic kidney disease: Secondary | ICD-10-CM | POA: Diagnosis not present

## 2018-11-27 DIAGNOSIS — Z992 Dependence on renal dialysis: Secondary | ICD-10-CM | POA: Diagnosis not present

## 2018-11-30 DIAGNOSIS — D509 Iron deficiency anemia, unspecified: Secondary | ICD-10-CM | POA: Diagnosis not present

## 2018-11-30 DIAGNOSIS — D631 Anemia in chronic kidney disease: Secondary | ICD-10-CM | POA: Diagnosis not present

## 2018-11-30 DIAGNOSIS — Z992 Dependence on renal dialysis: Secondary | ICD-10-CM | POA: Diagnosis not present

## 2018-11-30 DIAGNOSIS — N186 End stage renal disease: Secondary | ICD-10-CM | POA: Diagnosis not present

## 2018-11-30 DIAGNOSIS — N2581 Secondary hyperparathyroidism of renal origin: Secondary | ICD-10-CM | POA: Diagnosis not present

## 2018-12-01 ENCOUNTER — Ambulatory Visit: Payer: Self-pay | Admitting: Adult Health

## 2018-12-02 DIAGNOSIS — D631 Anemia in chronic kidney disease: Secondary | ICD-10-CM | POA: Diagnosis not present

## 2018-12-02 DIAGNOSIS — N2581 Secondary hyperparathyroidism of renal origin: Secondary | ICD-10-CM | POA: Diagnosis not present

## 2018-12-02 DIAGNOSIS — D509 Iron deficiency anemia, unspecified: Secondary | ICD-10-CM | POA: Diagnosis not present

## 2018-12-02 DIAGNOSIS — Z992 Dependence on renal dialysis: Secondary | ICD-10-CM | POA: Diagnosis not present

## 2018-12-02 DIAGNOSIS — N186 End stage renal disease: Secondary | ICD-10-CM | POA: Diagnosis not present

## 2018-12-04 DIAGNOSIS — N2581 Secondary hyperparathyroidism of renal origin: Secondary | ICD-10-CM | POA: Diagnosis not present

## 2018-12-04 DIAGNOSIS — N186 End stage renal disease: Secondary | ICD-10-CM | POA: Diagnosis not present

## 2018-12-04 DIAGNOSIS — D509 Iron deficiency anemia, unspecified: Secondary | ICD-10-CM | POA: Diagnosis not present

## 2018-12-04 DIAGNOSIS — Z992 Dependence on renal dialysis: Secondary | ICD-10-CM | POA: Diagnosis not present

## 2018-12-04 DIAGNOSIS — D631 Anemia in chronic kidney disease: Secondary | ICD-10-CM | POA: Diagnosis not present

## 2018-12-07 DIAGNOSIS — Z992 Dependence on renal dialysis: Secondary | ICD-10-CM | POA: Diagnosis not present

## 2018-12-07 DIAGNOSIS — N186 End stage renal disease: Secondary | ICD-10-CM | POA: Diagnosis not present

## 2018-12-07 DIAGNOSIS — E1122 Type 2 diabetes mellitus with diabetic chronic kidney disease: Secondary | ICD-10-CM | POA: Diagnosis not present

## 2018-12-08 DIAGNOSIS — Z23 Encounter for immunization: Secondary | ICD-10-CM | POA: Diagnosis not present

## 2018-12-08 DIAGNOSIS — Z992 Dependence on renal dialysis: Secondary | ICD-10-CM | POA: Diagnosis not present

## 2018-12-08 DIAGNOSIS — N2581 Secondary hyperparathyroidism of renal origin: Secondary | ICD-10-CM | POA: Diagnosis not present

## 2018-12-08 DIAGNOSIS — D631 Anemia in chronic kidney disease: Secondary | ICD-10-CM | POA: Diagnosis not present

## 2018-12-08 DIAGNOSIS — N186 End stage renal disease: Secondary | ICD-10-CM | POA: Diagnosis not present

## 2018-12-08 DIAGNOSIS — D509 Iron deficiency anemia, unspecified: Secondary | ICD-10-CM | POA: Diagnosis not present

## 2018-12-09 DIAGNOSIS — D631 Anemia in chronic kidney disease: Secondary | ICD-10-CM | POA: Diagnosis not present

## 2018-12-09 DIAGNOSIS — N186 End stage renal disease: Secondary | ICD-10-CM | POA: Diagnosis not present

## 2018-12-09 DIAGNOSIS — Z23 Encounter for immunization: Secondary | ICD-10-CM | POA: Diagnosis not present

## 2018-12-09 DIAGNOSIS — D509 Iron deficiency anemia, unspecified: Secondary | ICD-10-CM | POA: Diagnosis not present

## 2018-12-09 DIAGNOSIS — N2581 Secondary hyperparathyroidism of renal origin: Secondary | ICD-10-CM | POA: Diagnosis not present

## 2018-12-09 DIAGNOSIS — Z992 Dependence on renal dialysis: Secondary | ICD-10-CM | POA: Diagnosis not present

## 2018-12-11 DIAGNOSIS — D509 Iron deficiency anemia, unspecified: Secondary | ICD-10-CM | POA: Diagnosis not present

## 2018-12-11 DIAGNOSIS — N186 End stage renal disease: Secondary | ICD-10-CM | POA: Diagnosis not present

## 2018-12-11 DIAGNOSIS — N2581 Secondary hyperparathyroidism of renal origin: Secondary | ICD-10-CM | POA: Diagnosis not present

## 2018-12-11 DIAGNOSIS — Z23 Encounter for immunization: Secondary | ICD-10-CM | POA: Diagnosis not present

## 2018-12-11 DIAGNOSIS — D631 Anemia in chronic kidney disease: Secondary | ICD-10-CM | POA: Diagnosis not present

## 2018-12-11 DIAGNOSIS — Z992 Dependence on renal dialysis: Secondary | ICD-10-CM | POA: Diagnosis not present

## 2018-12-14 DIAGNOSIS — D509 Iron deficiency anemia, unspecified: Secondary | ICD-10-CM | POA: Diagnosis not present

## 2018-12-14 DIAGNOSIS — N2581 Secondary hyperparathyroidism of renal origin: Secondary | ICD-10-CM | POA: Diagnosis not present

## 2018-12-14 DIAGNOSIS — D631 Anemia in chronic kidney disease: Secondary | ICD-10-CM | POA: Diagnosis not present

## 2018-12-14 DIAGNOSIS — Z23 Encounter for immunization: Secondary | ICD-10-CM | POA: Diagnosis not present

## 2018-12-14 DIAGNOSIS — N186 End stage renal disease: Secondary | ICD-10-CM | POA: Diagnosis not present

## 2018-12-14 DIAGNOSIS — Z992 Dependence on renal dialysis: Secondary | ICD-10-CM | POA: Diagnosis not present

## 2018-12-16 DIAGNOSIS — Z992 Dependence on renal dialysis: Secondary | ICD-10-CM | POA: Diagnosis not present

## 2018-12-16 DIAGNOSIS — D509 Iron deficiency anemia, unspecified: Secondary | ICD-10-CM | POA: Diagnosis not present

## 2018-12-16 DIAGNOSIS — N186 End stage renal disease: Secondary | ICD-10-CM | POA: Diagnosis not present

## 2018-12-16 DIAGNOSIS — D631 Anemia in chronic kidney disease: Secondary | ICD-10-CM | POA: Diagnosis not present

## 2018-12-16 DIAGNOSIS — N2581 Secondary hyperparathyroidism of renal origin: Secondary | ICD-10-CM | POA: Diagnosis not present

## 2018-12-16 DIAGNOSIS — Z23 Encounter for immunization: Secondary | ICD-10-CM | POA: Diagnosis not present

## 2018-12-17 DIAGNOSIS — N186 End stage renal disease: Secondary | ICD-10-CM | POA: Diagnosis not present

## 2018-12-17 DIAGNOSIS — Z992 Dependence on renal dialysis: Secondary | ICD-10-CM | POA: Diagnosis not present

## 2018-12-17 DIAGNOSIS — T82858A Stenosis of vascular prosthetic devices, implants and grafts, initial encounter: Secondary | ICD-10-CM | POA: Diagnosis not present

## 2018-12-17 DIAGNOSIS — I871 Compression of vein: Secondary | ICD-10-CM | POA: Diagnosis not present

## 2018-12-18 DIAGNOSIS — Z23 Encounter for immunization: Secondary | ICD-10-CM | POA: Diagnosis not present

## 2018-12-18 DIAGNOSIS — N2581 Secondary hyperparathyroidism of renal origin: Secondary | ICD-10-CM | POA: Diagnosis not present

## 2018-12-18 DIAGNOSIS — D631 Anemia in chronic kidney disease: Secondary | ICD-10-CM | POA: Diagnosis not present

## 2018-12-18 DIAGNOSIS — N186 End stage renal disease: Secondary | ICD-10-CM | POA: Diagnosis not present

## 2018-12-18 DIAGNOSIS — Z992 Dependence on renal dialysis: Secondary | ICD-10-CM | POA: Diagnosis not present

## 2018-12-18 DIAGNOSIS — D509 Iron deficiency anemia, unspecified: Secondary | ICD-10-CM | POA: Diagnosis not present

## 2018-12-21 DIAGNOSIS — D509 Iron deficiency anemia, unspecified: Secondary | ICD-10-CM | POA: Diagnosis not present

## 2018-12-21 DIAGNOSIS — D631 Anemia in chronic kidney disease: Secondary | ICD-10-CM | POA: Diagnosis not present

## 2018-12-21 DIAGNOSIS — Z992 Dependence on renal dialysis: Secondary | ICD-10-CM | POA: Diagnosis not present

## 2018-12-21 DIAGNOSIS — Z23 Encounter for immunization: Secondary | ICD-10-CM | POA: Diagnosis not present

## 2018-12-21 DIAGNOSIS — N186 End stage renal disease: Secondary | ICD-10-CM | POA: Diagnosis not present

## 2018-12-21 DIAGNOSIS — N2581 Secondary hyperparathyroidism of renal origin: Secondary | ICD-10-CM | POA: Diagnosis not present

## 2018-12-23 DIAGNOSIS — N2581 Secondary hyperparathyroidism of renal origin: Secondary | ICD-10-CM | POA: Diagnosis not present

## 2018-12-23 DIAGNOSIS — N186 End stage renal disease: Secondary | ICD-10-CM | POA: Diagnosis not present

## 2018-12-23 DIAGNOSIS — D631 Anemia in chronic kidney disease: Secondary | ICD-10-CM | POA: Diagnosis not present

## 2018-12-23 DIAGNOSIS — D509 Iron deficiency anemia, unspecified: Secondary | ICD-10-CM | POA: Diagnosis not present

## 2018-12-23 DIAGNOSIS — Z23 Encounter for immunization: Secondary | ICD-10-CM | POA: Diagnosis not present

## 2018-12-23 DIAGNOSIS — Z992 Dependence on renal dialysis: Secondary | ICD-10-CM | POA: Diagnosis not present

## 2018-12-25 DIAGNOSIS — D631 Anemia in chronic kidney disease: Secondary | ICD-10-CM | POA: Diagnosis not present

## 2018-12-25 DIAGNOSIS — Z992 Dependence on renal dialysis: Secondary | ICD-10-CM | POA: Diagnosis not present

## 2018-12-25 DIAGNOSIS — N186 End stage renal disease: Secondary | ICD-10-CM | POA: Diagnosis not present

## 2018-12-25 DIAGNOSIS — Z23 Encounter for immunization: Secondary | ICD-10-CM | POA: Diagnosis not present

## 2018-12-25 DIAGNOSIS — D509 Iron deficiency anemia, unspecified: Secondary | ICD-10-CM | POA: Diagnosis not present

## 2018-12-25 DIAGNOSIS — N2581 Secondary hyperparathyroidism of renal origin: Secondary | ICD-10-CM | POA: Diagnosis not present

## 2018-12-28 DIAGNOSIS — Z992 Dependence on renal dialysis: Secondary | ICD-10-CM | POA: Diagnosis not present

## 2018-12-28 DIAGNOSIS — D631 Anemia in chronic kidney disease: Secondary | ICD-10-CM | POA: Diagnosis not present

## 2018-12-28 DIAGNOSIS — Z23 Encounter for immunization: Secondary | ICD-10-CM | POA: Diagnosis not present

## 2018-12-28 DIAGNOSIS — D509 Iron deficiency anemia, unspecified: Secondary | ICD-10-CM | POA: Diagnosis not present

## 2018-12-28 DIAGNOSIS — N2581 Secondary hyperparathyroidism of renal origin: Secondary | ICD-10-CM | POA: Diagnosis not present

## 2018-12-28 DIAGNOSIS — N186 End stage renal disease: Secondary | ICD-10-CM | POA: Diagnosis not present

## 2018-12-30 DIAGNOSIS — N2581 Secondary hyperparathyroidism of renal origin: Secondary | ICD-10-CM | POA: Diagnosis not present

## 2018-12-30 DIAGNOSIS — Z992 Dependence on renal dialysis: Secondary | ICD-10-CM | POA: Diagnosis not present

## 2018-12-30 DIAGNOSIS — Z23 Encounter for immunization: Secondary | ICD-10-CM | POA: Diagnosis not present

## 2018-12-30 DIAGNOSIS — N186 End stage renal disease: Secondary | ICD-10-CM | POA: Diagnosis not present

## 2018-12-30 DIAGNOSIS — D509 Iron deficiency anemia, unspecified: Secondary | ICD-10-CM | POA: Diagnosis not present

## 2018-12-30 DIAGNOSIS — D631 Anemia in chronic kidney disease: Secondary | ICD-10-CM | POA: Diagnosis not present

## 2019-01-01 DIAGNOSIS — Z992 Dependence on renal dialysis: Secondary | ICD-10-CM | POA: Diagnosis not present

## 2019-01-01 DIAGNOSIS — N2581 Secondary hyperparathyroidism of renal origin: Secondary | ICD-10-CM | POA: Diagnosis not present

## 2019-01-01 DIAGNOSIS — D631 Anemia in chronic kidney disease: Secondary | ICD-10-CM | POA: Diagnosis not present

## 2019-01-01 DIAGNOSIS — Z23 Encounter for immunization: Secondary | ICD-10-CM | POA: Diagnosis not present

## 2019-01-01 DIAGNOSIS — D509 Iron deficiency anemia, unspecified: Secondary | ICD-10-CM | POA: Diagnosis not present

## 2019-01-01 DIAGNOSIS — N186 End stage renal disease: Secondary | ICD-10-CM | POA: Diagnosis not present

## 2019-01-04 DIAGNOSIS — Z992 Dependence on renal dialysis: Secondary | ICD-10-CM | POA: Diagnosis not present

## 2019-01-04 DIAGNOSIS — N186 End stage renal disease: Secondary | ICD-10-CM | POA: Diagnosis not present

## 2019-01-04 DIAGNOSIS — D509 Iron deficiency anemia, unspecified: Secondary | ICD-10-CM | POA: Diagnosis not present

## 2019-01-04 DIAGNOSIS — N2581 Secondary hyperparathyroidism of renal origin: Secondary | ICD-10-CM | POA: Diagnosis not present

## 2019-01-04 DIAGNOSIS — D631 Anemia in chronic kidney disease: Secondary | ICD-10-CM | POA: Diagnosis not present

## 2019-01-04 DIAGNOSIS — Z23 Encounter for immunization: Secondary | ICD-10-CM | POA: Diagnosis not present

## 2019-01-06 DIAGNOSIS — N186 End stage renal disease: Secondary | ICD-10-CM | POA: Diagnosis not present

## 2019-01-06 DIAGNOSIS — D509 Iron deficiency anemia, unspecified: Secondary | ICD-10-CM | POA: Diagnosis not present

## 2019-01-06 DIAGNOSIS — E1122 Type 2 diabetes mellitus with diabetic chronic kidney disease: Secondary | ICD-10-CM | POA: Diagnosis not present

## 2019-01-06 DIAGNOSIS — E119 Type 2 diabetes mellitus without complications: Secondary | ICD-10-CM | POA: Diagnosis not present

## 2019-01-06 DIAGNOSIS — L03119 Cellulitis of unspecified part of limb: Secondary | ICD-10-CM | POA: Diagnosis not present

## 2019-01-06 DIAGNOSIS — D631 Anemia in chronic kidney disease: Secondary | ICD-10-CM | POA: Diagnosis not present

## 2019-01-06 DIAGNOSIS — N2581 Secondary hyperparathyroidism of renal origin: Secondary | ICD-10-CM | POA: Diagnosis not present

## 2019-01-06 DIAGNOSIS — Z992 Dependence on renal dialysis: Secondary | ICD-10-CM | POA: Diagnosis not present

## 2019-01-08 DIAGNOSIS — L03119 Cellulitis of unspecified part of limb: Secondary | ICD-10-CM | POA: Diagnosis not present

## 2019-01-08 DIAGNOSIS — D509 Iron deficiency anemia, unspecified: Secondary | ICD-10-CM | POA: Diagnosis not present

## 2019-01-08 DIAGNOSIS — N2581 Secondary hyperparathyroidism of renal origin: Secondary | ICD-10-CM | POA: Diagnosis not present

## 2019-01-08 DIAGNOSIS — E119 Type 2 diabetes mellitus without complications: Secondary | ICD-10-CM | POA: Diagnosis not present

## 2019-01-08 DIAGNOSIS — N186 End stage renal disease: Secondary | ICD-10-CM | POA: Diagnosis not present

## 2019-01-08 DIAGNOSIS — Z992 Dependence on renal dialysis: Secondary | ICD-10-CM | POA: Diagnosis not present

## 2019-01-09 ENCOUNTER — Encounter: Payer: Self-pay | Admitting: Internal Medicine

## 2019-01-09 DIAGNOSIS — Z794 Long term (current) use of insulin: Secondary | ICD-10-CM | POA: Insufficient documentation

## 2019-01-09 DIAGNOSIS — E1169 Type 2 diabetes mellitus with other specified complication: Secondary | ICD-10-CM | POA: Insufficient documentation

## 2019-01-09 DIAGNOSIS — E0822 Diabetes mellitus due to underlying condition with diabetic chronic kidney disease: Secondary | ICD-10-CM | POA: Insufficient documentation

## 2019-01-09 DIAGNOSIS — E785 Hyperlipidemia, unspecified: Secondary | ICD-10-CM | POA: Insufficient documentation

## 2019-01-09 NOTE — Progress Notes (Signed)
Annual Screening/Preventative Visit & Comprehensive Evaluation &  Examination     This very nice 73 y.o. MWF  presents for a Screening /Preventative Visit & comprehensive evaluation and management of multiple medical co-morbidities.  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 Patient also has hx/o Gout controlled with her Allopurinol.This is her 1st visit since last December.       HTN predates since 2000.  In 2014, she  Underwent CABG x 4V. In Jan 2019 and again in Sept she was hospitalized with CHF. Patient's BP has been controlled at home and patient denies any cardiac symptoms as chest pain, palpitations, shortness of breath, dizziness or ankle swelling. Today's BP is at goal - 128/74.      Patient's hyperlipidemia is controlled with diet and medications. Patient denies myalgias or other medication SE's. 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 Patient was initiated on Dialysis in Sept 2019 during a hospitalization managing combined Left & Right heart failure. She has been  is on Dialysis 3 x /week & is followed by Dr Madelon Lips.  She does have Diabetic peripheral Neuropathy & is s/p amputation of her Rt 5th toe & Lt 2sd toe for Osteomyelitis.  She has hx/o bilat foot drop - wearing bilat ankle braces. Currently she is taking Novolin 70/30 mix 10-30 u qam and 10 u qpm. Patient seems indifferent or lacks insight of the importance of her  diabetic control.   Patient denies reactive hypoglycemic symptoms, visual blurring, diabetic polys, but does report  numb & tingling paresthesias of her feet w/recumbancy. Last A1c was at goal:   Lab Results  Component Value Date   HGBA1C 5.4 03/22/2018      Finally, patient has history of Vitamin D Deficiency ("25" / 2008, "32" / 2016 and "34" / 2018) and last Vitamin D was at goal:  Lab Results  Component Value Date   VD25OH 66 03/22/2018   Current Outpatient Medications on File Prior to Visit  Medication Sig  . allopurinol (ZYLOPRIM) 100 MG tablet Take 1 tablet 2 x /day to Prevent Gout  . aspirin EC 81 MG tablet Take 1 tablet (81 mg total) by mouth daily.  . Aspirin-Acetaminophen-Caffeine (EXCEDRIN EXTRA STRENGTH PO) Take by mouth as needed.  Marland Kitchen b complex-vitamin c-folic acid (NEPHRO-VITE) 0.8 MG TABS tablet TAKE 1 TABLET BY MOUTH EVERY DAY (ON DIALYSIS DAYS, TAKE AFTER DIALYSIS TREATMENT)  . BD VEO INSULIN SYRINGE U/F 31G X 15/64" 1 ML MISC USE AS DIRECTED WITH INSULIN  . cinacalcet (SENSIPAR) 30 MG tablet Take 30 mg by mouth. Takes 1 tablet 3 days a week.  . docusate sodium (COLACE) 100 MG capsule Take 100 mg by mouth at bedtime.  . fluticasone (FLONASE) 50 MCG/ACT nasal spray Place 2 sprays into both nostrils daily as needed for allergies or rhinitis.  Marland Kitchen glucose blood (PRODIGY NO CODING BLOOD GLUC) test strip CHECK SUGARS 3 TIMES A DAY.[id number]  . hydrALAZINE (APRESOLINE) 25 MG tablet Take 3 tablets (75 mg) 3 x /day for BP & Heart  .  insulin NPH-regular Human (70-30) 100 UNIT/ML injection Inject 10-30 Units into the skin 2 (two) times daily with a meal.  . isosorbide dinitrate (ISORDIL) 10 MG tablet Take 1 tablet 3 x /day for BP & Heart  . loratadine (CLARITIN) 10 MG tablet Take 10 mg by mouth daily.  . Multiple Vitamins-Minerals (PRESERVISION AREDS 2 PO) Take 2 capsules by mouth daily.  . Probiotic Product (PROBIOTIC DAILY PO) Take 1 tablet by mouth daily.  . psyllium (METAMUCIL) 58.6 % powder Take 1 packet by mouth 3 (three) times daily.  . sevelamer carbonate (RENVELA) 800 MG tablet TAKE 2 TABLETS BY MOUTH THREE TIMES DAILY WITH MEALS  . Cholecalciferol (VITAMIN D3)  2000 units TABS Take 4,000 Units by mouth daily.    No current facility-administered medications on file prior to visit.    Allergies  Allergen Reactions  . Atenolol Other (See Comments)    Exacerbates gout Exacerbates gout  . Adhesive [Tape] Other (See Comments)    SKIN IS VERY SENSITIVE AND BRUISES AND TEARS EASILY; PLEASE USE AN ALTERNATIVE TO TAPE!!  . Contrast Media [Iodinated Diagnostic Agents] Rash  . Latex Rash  . Omnipaque [Iohexol] Rash   Past Medical History:  Diagnosis Date  . Allergy   . Arthritis    "maybe in my lower back" (08/27/2012)  . Breast cancer ([place]) 02/10/13   left breast bx=Invasive ductal Ca,DCIS w/calcifications  . Chronic combined systolic and diastolic CHF (congestive heart failure) ([place])   . CKD (chronic kidney disease), stage IV ([place])   . Coronary artery disease    a. NSTEMI in 05/2009 s/p CABG (LIMA-LAD, SVG-diagonal, SVG-OM1/OM 2).   Marland Kitchen Dyspnea    with exertion  . Family history of anesthesia complication    Mom has a hard time to wake up  . Foot ulcer ([place])    "I've had them on both feet" (08/27/2012)  . Gouty arthritis    "right index finger" (08/27/2012)  . History of radiation therapy 06/09/13-07/06/13   left breast 50Gy  . Hyperlipidemia   . Hypertension   . Infection    right second toe  . Ischemic cardiomyopathy    a. 2011: EF 40-45%. b. EF 55-60% in 04/2017 but shortly after as inpatient was 45-50%.  Marland Kitchen LBBB (left bundle branch block)   . Mitral regurgitation   . Neuropathy    Hx; of B/L feet  . NSTEMI (non-ST elevated myocardial infarction) ([place]) 05/23/2009  . Osteomyelitis of foot ([place])   . PAD (peripheral artery disease) ([facility])    a. LE PAD (patient previously elected hold off L fem-pop), prev followed by Dr. Bridgett Larsson  . PONV (postoperative nausea and vomiting)   . Sinus headache    "occasionally" (08/27/2012)  . Type II diabetes mellitus ([place])   . Vitamin D deficiency    Health Maintenance  Topic Date Due  . Hepatitis C Screening   [birth year]  . OPHTHALMOLOGY EXAM  10/18/2016  . HEMOGLOBIN A1C  09/21/2018  . INFLUENZA VACCINE  11/06/2018  . MAMMOGRAM  01/21/2019  . FOOT EXAM  01/09/2020  . COLONOSCOPY  03/08/2020  . TETANUS/TDAP  01/12/2022  . DEXA SCAN  Completed  . PNA vac Low Risk Adult  Completed   Immunization History  Administered Date(s) Administered  . Influenza, High Dose Seasonal PF 01/30/2014, 02/14/2015, 12/17/2015, 01/22/2017, 01/07/2018  . Influenza-Unspecified 02/05/2013  . Pneumococcal Conjugate-13 01/30/2014  . Pneumococcal Polysaccharide-23 01/07/2011  . Td 01/13/2012  . Zoster 01/13/2012   Last Colon -  Last MGM -  Past Surgical History:  Procedure Laterality Date  . AMPUTATION Right 08/27/2012   Procedure: AMPUTATION RAY;  Surgeon: Newt Minion, MD;  Location: [facility];  Service: Orthopedics;  Laterality: Right;  Right Foot 5th Ray Amputation  . AMPUTATION Left 07/08/2013   Procedure: AMPUTATION RAY;  Surgeon: Newt Minion, MD;  Location: [facility];  Service: Orthopedics;  Laterality: Left;  Left Foot 2nd Ray Amputation  . AMPUTATION RAY Right 08/27/2012   5th ray/notes 08/27/2012  . AMPUTATION TOE Right 03/06/2017   Procedure: AMPUTATION TOE, INTERPHANGEAL 2ND RIGHT;  Surgeon: [name], DPM;  Location: [facility];  Service: Podiatry;  Laterality: Right;  . AV FISTULA PLACEMENT Right 11/11/2017   Procedure: RIGHT RADIOCEPHALIC  ARTERIOVENOUS FISTULA;  Surgeon: [name], MD;  Location: [facility];  Service: Vascular;  Laterality: Right;  . BREAST LUMPECTOMY  04/20/2013   with biopsy      DR WAKEFIELD  . BREAST LUMPECTOMY WITH NEEDLE LOCALIZATION AND AXILLARY SENTINEL LYMPH NODE BX Left 04/20/2013   Procedure: LEFT BREAST WIRE GUIDED LUMPECTOMY AND AXILLARY SENTINEL LYMPH NODE BX;  Surgeon: Rolm Bookbinder, MD;  Location: [facility];  Service: General;  Laterality: Left;  . CARDIAC CATHETERIZATION  05/24/2009   Archie Endo 05/24/2009 (08/27/2012)  . CATARACT EXTRACTION W/ INTRAOCULAR LENS  IMPLANT,  BILATERAL  2000's  . COLONOSCOPY W/ BIOPSIES AND POLYPECTOMY  2010  . CORONARY ARTERY BYPASS GRAFT  2011   "CABG X4" (08/27/2012)  . DENTAL SURGERY  04/30/11   "1 implant" (08/27/2012)  . DILATION AND CURETTAGE OF UTERUS    . EYE SURGERY    . FINGER SURGERY Right    "index finger; turned out to be gout" (08/27/2012)  . IR FLUORO GUIDE CV LINE RIGHT  11/30/2017  . TEE WITHOUT CARDIOVERSION N/A 12/01/2017   Procedure: TRANSESOPHAGEAL ECHOCARDIOGRAM (TEE);  Surgeon: Lelon Perla, MD;  [facility];  Service: Cardiovascular;  Laterality: N/A;   Family History  Problem Relation Age of Onset  . Kidney cancer Brother 39  . Hypertension Brother   . Breast cancer Sister 40       LCIS; BRCA negative  . Throat cancer Father 61       smoker  . Breast cancer Sister 24       Lobular breast cancer  . Breast cancer Maternal Aunt        dx in her 48s   Social History   Tobacco Use  . Smoking status: Former Research scientist (life sciences)  . Smokeless tobacco: Never Used  Substance Use Topics  . Alcohol use: Yes    Comment: rarely drinks wine  . Drug use: No    ROS Constitutional: Denies fever, chills, weight loss/gain, headaches, insomnia,  night sweats, and change in appetite. Does c/o fatigue. Eyes: Denies redness, blurred vision, diplopia, discharge, itchy, watery eyes.  ENT: Denies discharge, congestion, post nasal drip, epistaxis, sore throat, earache, hearing loss, dental pain, Tinnitus, Vertigo, Sinus pain, snoring.  Cardio: Denies chest pain, palpitations, irregular heartbeat, syncope, dyspnea, diaphoresis, orthopnea, PND, claudication, edema Respiratory: denies cough, dyspnea, DOE, pleurisy, hoarseness, laryngitis, wheezing.  Gastrointestinal: Denies dysphagia, heartburn, reflux, water brash, pain, cramps, nausea, vomiting, bloating, diarrhea, constipation, hematemesis, melena, hematochezia, jaundice, hemorrhoids Genitourinary: Denies dysuria, frequency, urgency, nocturia, hesitancy, discharge,  hematuria, flank pain Breast: Breast lumps, nipple discharge, bleeding.  Musculoskeletal: Denies arthralgia, myalgia, stiffness, Jt. Swelling, pain, limp, and strain/sprain. Denies falls. Skin: Denies puritis, rash, hives, warts, acne, eczema, changing in skin lesion Neuro: No weakness, tremor, incoordination, spasms,  paresthesia, pain Psychiatric: Denies confusion, memory loss, sensory loss. Denies Depression. Endocrine: Denies change in weight, skin, hair change, nocturia, and paresthesia, diabetic polys, visual blurring, hyper / hypo glycemic episodes.  Heme/Lymph: No excessive bleeding, bruising, enlarged lymph nodes.  Physical Exam  BP 128/74   Pulse 96   Temp (!) 97.4 F (36.3 C)   Resp 18   Ht 5' 4.5" (1.638 m)   Wt 163 lb (73.9 kg)   BMI 27.55 kg/m   General Appearance: Over nourished, well groomed and in no apparent distress.  Eyes: PERRLA, EOMs, conjunctiva no swelling or erythema, normal fundi and vessels. Sinuses: No frontal/maxillary tenderness ENT/Mouth: EACs patent / TMs  nl. Nares clear without erythema, swelling, mucoid exudates. Oral hygiene is good. No erythema, swelling, or exudate. Tongue normal, non-obstructing. Tonsils not swollen or erythematous. Hearing normal.  Neck: Supple, thyroid not palpable. No bruits, nodes or JVD. Respiratory: Respiratory effort normal.  BS equal and clear bilateral without rales, rhonci, wheezing or stridor. Cardio: Heart sounds are normal with regular rate and rhythm and no murmurs, rubs or gallops. Peripheral pulses are normal and equal bilaterally without edema. No aortic or femoral bruits. Chest: symmetric with normal excursions and percussion. Breasts: Symmetric, without lumps, nipple discharge, retractions, or fibrocystic changes.  Abdomen: Flat, soft with bowel sounds active. Nontender, no guarding, rebound, hernias, masses, or organomegaly.  Lymphatics: Non tender without lymphadenopathy.  Musculoskeletal: Full ROM all  peripheral extremities, joint stability, 5/5 strength, and normal gait. Skin: Warm and dry without rashes, lesions, cyanosis, clubbing or  ecchymosis.  Neuro: Cranial nerves intact, DTR's  flat throughout. Normal muscle tone, no cerebellar symptoms. Sensation decreased to touch, vibratory and Monofilament to the toes bilaterally. Pysch: Alert and oriented X 3, normal affect, Poor Insight and Judgment .   Assessment and Plan  1. Essential hypertension  - EKG 12-Lead - Urinalysis, Routine w reflex microscopic - LOW EXTREMITY NEUR EXAM DOCUM - CBC with Differential/Platelet - COMPLETE METABOLIC PANEL WITH GFR - Magnesium - TSH  2. Hyperlipidemia associated with type 2 diabetes mellitus (HCC)  - Lipid panel - TSH  3. Diabetes mellitus due to underlying condition with chronic kidney disease on chronic dialysis, with long-term current use of insulin (HCC)  - COMPLETE METABOLIC PANEL WITH GFR - Hemoglobin A1c  4. Vitamin D deficiency  - VITAMIN D 25 Hydroxyl  5. ESRD (end stage renal disease) ([facility])  - COMPLETE METABOLIC PANEL WITH GFR  6. Type II diabetes mellitus with neurological manifestations, uncontrolled ([place])  - HM DIABETES FOOT EXAM - LOW EXTREMITY NEUR EXAM DOCUM  7. Chronic combined systolic and diastolic congestive heart failure ([place])  8. Gouty arthritis  - Uric acid  9. Peripheral vascular disease ([place])  10. Screening for colorectal cancer  - POC Hemoccult Bld/Stl  11. Screening for ischemic heart disease  12. FHx: heart disease  13. Former smoker  33. Medication management  - CBC with Differential/Platelet - COMPLETE METABOLIC PANEL WITH GFR - Magnesium - Lipid panel - TSH - Hemoglobin A1c - VITAMIN D 25 Hydroxyl - Uric acid       Patient was counseled in prudent diet to achieve/maintain BMI less than 25 for weight control, BP monitoring, regular exercise and medications. Discussed med's effects and SE's. Screening labs and tests as requested  with regular follow-up as recommended. Over 40 minutes of exam, counseling, chart review and high complex critical decision making was performed.   [name], MD

## 2019-01-09 NOTE — Patient Instructions (Signed)
Vit D  & Vit C 1,000 mg   are recommended to help protect  against the Covid-19 and other Corona viruses.    Also it's recommended  to take  Zinc 50 mg  to help  protect against the Covid-19   and best place to get  is also on [email or web address]  and don't pay more than 6-8 cents /pill !  ================================ Coronavirus (COVID-19) Are you at risk?  Are you at risk for the Coronavirus (COVID-19)?  To be considered HIGH RISK for Coronavirus (COVID-19), you have to meet the following criteria:  . Traveled to [place], [place], [place], [place] or [place]; or in the [place] to [place], [place], [place]  . or [place]; and have fever, cough, and shortness of breath within the last 2 weeks of travel OR . Been in close contact with a person diagnosed with COVID-19 within the last 2 weeks and have  . fever, cough,and shortness of breath .  . IF YOU DO NOT MEET THESE CRITERIA, YOU ARE CONSIDERED LOW RISK FOR COVID-19.  What to do if you are HIGH RISK for COVID-19?  Marland Kitchen If you are having a medical emergency, call 911. . Seek medical care right away. Before you go to a doctor's office, urgent care or emergency department, .  call ahead and tell them about your recent travel, contact with someone diagnosed with COVID-19  .  and your symptoms.  . You should receive instructions from your physician's office regarding next steps of care.  . When you arrive at healthcare provider, tell the healthcare staff immediately you have returned from  . visiting [place], [place], [place], [place] or [place]; or traveled in the [place] to [place], [place],  . [place] or [place] in the last two weeks or you have been in close contact with a person diagnosed with  . COVID-19 in the last 2 weeks.   . Tell the health care staff about your symptoms: fever, cough and shortness of breath. . After you have been seen by a medical provider, you will be either: o Tested for (COVID-19) and  discharged home on quarantine except to seek medical care if  o symptoms worsen, and asked to  - Stay home and avoid contact with others until you get your results (4-5 days)  - Avoid travel on public transportation if possible (such as bus, train, or airplane) or o Sent to the Emergency Department by EMS for evaluation, COVID-19 testing  and  o possible admission depending on your condition and test results.  What to do if you are LOW RISK for COVID-19?  Reduce your risk of any infection by using the same precautions used for avoiding the common cold or flu:  Marland Kitchen Wash your hands often with soap and warm water for at least 20 seconds.  If soap and water are not readily available,  . use an alcohol-based hand sanitizer with at least 60% alcohol.  . If coughing or sneezing, cover your mouth and nose by coughing or sneezing into the elbow areas of your shirt or coat, .  into a tissue or into your sleeve (not your hands). . Avoid shaking hands with others and consider head nods or verbal greetings only. . Avoid touching your eyes, nose, or mouth with unwashed hands.  . Avoid close contact with people who are sick. . Avoid places or events with large numbers of people in one location, like concerts or sporting events. Marland Kitchen  Carefully consider travel plans you have or are making. . If you are planning any travel outside or inside the [name], visit the CDC's Travelers' Health webpage for the latest health notices. . If you have some symptoms but not all symptoms, continue to monitor at home and seek medical attention  . if your symptoms worsen. . If you are having a medical emergency, call 911.   . >>>>>>>>>>>>>>>>>>>>>>>>>>>>>>>>> . We Do NOT Approve of  [facility], [employer]  Our Patients  To Do Home Visits & We Do NOT Approve of [employer] > > > > > > > > > > > > > > > > > > > > > > > > > > > > > > > > > > > > > > >  Preventive Care for Adults  A healthy lifestyle  and preventive care can promote health and wellness. Preventive health guidelines for women include the following key practices.  A routine yearly physical is a good way to check with your health care provider about your health and preventive screening. It is a chance to share any concerns and updates on your health and to receive a thorough exam.  Visit your dentist for a routine exam and preventive care every 6 months. Brush your teeth twice a day and floss once a day. Good oral hygiene prevents tooth decay and gum disease.  The frequency of eye exams is based on your age, health, family medical history, use of contact lenses, and other factors. Follow your health care provider's recommendations for frequency of eye exams.  Eat a healthy diet. Foods like vegetables, fruits, whole grains, low-fat dairy products, and lean protein foods contain the nutrients you need without too many calories. Decrease your intake of foods high in solid fats, added sugars, and salt. Eat the right amount of calories for you. Get information about a proper diet from your health care provider, if necessary.  Regular physical exercise is one of the most important things you can do for your health. Most adults should get at least 150 minutes of moderate-intensity exercise (any activity that increases your heart rate and causes you to sweat) each week. In addition, most adults need muscle-strengthening exercises on 2 or more days a week.  Maintain a healthy weight. The body mass index (BMI) is a screening tool to identify possible weight problems. It provides an estimate of body fat based on height and weight. Your health care provider can find your BMI and can help you achieve or maintain a healthy weight. For adults 20 years and older:  A BMI below 18.5 is considered underweight.  A BMI of 18.5 to 24.9 is normal.  A BMI of 25 to 29.9 is considered overweight.  A BMI of 30 and above is considered obese.  Maintain  normal blood lipids and cholesterol levels by exercising and minimizing your intake of saturated fat. Eat a balanced diet with plenty of fruit and vegetables. If your lipid or cholesterol levels are high, you are over 50, or you are at high risk for heart disease, you may need your cholesterol levels checked more frequently. Ongoing high lipid and cholesterol levels should be treated with medicines if diet and exercise are not working.  If you smoke, find out from your health care provider how to quit. If you do not use tobacco, do not start.  Lung cancer screening is recommended for adults aged 34-80 years who are at high risk for developing lung cancer  because of a history of smoking. A yearly low-dose CT scan of the lungs is recommended for people who have at least a 30-pack-year history of smoking and are a current smoker or have quit within the past 15 years. A pack year of smoking is smoking an average of 1 pack of cigarettes a day for 1 year (for example: 1 pack a day for 30 years or 2 packs a day for 15 years). Yearly screening should continue until the smoker has stopped smoking for at least 15 years. Yearly screening should be stopped for people who develop a health problem that would prevent them from having lung cancer treatment.  Avoid use of street drugs. Do not share needles with anyone. Ask for help if you need support or instructions about stopping the use of drugs.  High blood pressure causes heart disease and increases the risk of stroke.  Ongoing high blood pressure should be treated with medicines if weight loss and exercise do not work.  If you are 66-18 years old, ask your health care provider if you should take aspirin to prevent strokes.  Diabetes screening involves taking a blood sample to check your fasting blood sugar level. This should be done once every 3 years, after age 35, if you are within normal weight and without risk factors for diabetes. Testing should be considered  at a younger age or be carried out more frequently if you are overweight and have at least 1 risk factor for diabetes.  Breast cancer screening is essential preventive care for women. You should practice "breast self-awareness." This means understanding the normal appearance and feel of your breasts and may include breast self-examination. Any changes detected, no matter how small, should be reported to a health care provider. Women in their 74s and 30s should have a clinical breast exam (CBE) by a health care provider as part of a regular health exam every 1 to 3 years. After age 5, women should have a CBE every year. Starting at age 52, women should consider having a mammogram (breast X-ray test) every year. Women who have a family history of breast cancer should talk to their health care provider about genetic screening. Women at a high risk of breast cancer should talk to their health care providers about having an MRI and a mammogram every year.  Breast cancer gene (BRCA)-related cancer risk assessment is recommended for women who have family members with BRCA-related cancers. BRCA-related cancers include breast, ovarian, tubal, and peritoneal cancers. Having family members with these cancers may be associated with an increased risk for harmful changes (mutations) in the breast cancer genes BRCA1 and BRCA2. Results of the assessment will determine the need for genetic counseling and BRCA1 and BRCA2 testing.  Routine pelvic exams to screen for cancer are no longer recommended for nonpregnant women who are considered low risk for cancer of the pelvic organs (ovaries, uterus, and vagina) and who do not have symptoms. Ask your health care provider if a screening pelvic exam is right for you.  If you have had past treatment for cervical cancer or a condition that could lead to cancer, you need Pap tests and screening for cancer for at least 20 years after your treatment. If Pap tests have been discontinued,  your risk factors (such as having a new sexual partner) need to be reassessed to determine if screening should be resumed. Some women have medical problems that increase the chance of getting cervical cancer. In these cases, your health care  provider may recommend more frequent screening and Pap tests.    Colorectal cancer can be detected and often prevented. Most routine colorectal cancer screening begins at the age of 12 years and continues through age 30 years. However, your health care provider may recommend screening at an earlier age if you have risk factors for colon cancer. On a yearly basis, your health care provider may provide home test kits to check for hidden blood in the stool. Use of a small camera at the end of a tube, to directly examine the colon (sigmoidoscopy or colonoscopy), can detect the earliest forms of colorectal cancer. Talk to your health care provider about this at age 20, when routine screening begins.  Direct exam of the colon should be repeated every 5-10 years through age 68 years, unless early forms of pre-cancerous polyps or small growths are found.  Osteoporosis is a disease in which the bones lose minerals and strength with aging. This can result in serious bone fractures or breaks. The risk of osteoporosis can be identified using a bone density scan. Women ages 11 years and over and women at risk for fractures or osteoporosis should discuss screening with their health care providers. Ask your health care provider whether you should take a calcium supplement or vitamin D to reduce the rate of osteoporosis.  Menopause can be associated with physical symptoms and risks. Hormone replacement therapy is available to decrease symptoms and risks. You should talk to your health care provider about whether hormone replacement therapy is right for you.  Use sunscreen. Apply sunscreen liberally and repeatedly throughout the day. You should seek shade when your shadow is shorter  than you. Protect yourself by wearing long sleeves, pants, a wide-brimmed hat, and sunglasses year round, whenever you are outdoors.  Once a month, do a whole body skin exam, using a mirror to look at the skin on your back. Tell your health care provider of new moles, moles that have irregular borders, moles that are larger than a pencil eraser, or moles that have changed in shape or color.  Stay current with required vaccines (immunizations).  Influenza vaccine. All adults should be immunized every year.  Tetanus, diphtheria, and acellular pertussis (Td, Tdap) vaccine. Pregnant women should receive 1 dose of Tdap vaccine during each pregnancy. The dose should be obtained regardless of the length of time since the last dose. Immunization is preferred during the 27th-36th week of gestation. An adult who has not previously received Tdap or who does not know her vaccine status should receive 1 dose of Tdap. This initial dose should be followed by tetanus and diphtheria toxoids (Td) booster doses every 10 years. Adults with an unknown or incomplete history of completing a 3-dose immunization series with Td-containing vaccines should begin or complete a primary immunization series including a Tdap dose. Adults should receive a Td booster every 10 years.    Zoster vaccine. One dose is recommended for adults aged 25 years or older unless certain conditions are present.    Pneumococcal 13-valent conjugate (PCV13) vaccine. When indicated, a person who is uncertain of her immunization history and has no record of immunization should receive the PCV13 vaccine. An adult aged 77 years or older who has certain medical conditions and has not been previously immunized should receive 1 dose of PCV13 vaccine. This PCV13 should be followed with a dose of pneumococcal polysaccharide (PPSV23) vaccine. The PPSV23 vaccine dose should be obtained at least 1 or more year(s) after the dose of  PCV13 vaccine. An adult aged 68  years or older who has certain medical conditions and previously received 1 or more doses of PPSV23 vaccine should receive 1 dose of PCV13. The PCV13 vaccine dose should be obtained 1 or more years after the last PPSV23 vaccine dose.    Pneumococcal polysaccharide (PPSV23) vaccine. When PCV13 is also indicated, PCV13 should be obtained first. All adults aged 61 years and older should be immunized. An adult younger than age 57 years who has certain medical conditions should be immunized. Any person who resides in a nursing home or long-term care facility should be immunized. An adult smoker should be immunized. People with an immunocompromised condition and certain other conditions should receive both PCV13 and PPSV23 vaccines. People with human immunodeficiency virus (HIV) infection should be immunized as soon as possible after diagnosis. Immunization during chemotherapy or radiation therapy should be avoided. Routine use of PPSV23 vaccine is not recommended for American Indians, [place], or people younger than 65 years unless there are medical conditions that require PPSV23 vaccine. When indicated, people who have unknown immunization and have no record of immunization should receive PPSV23 vaccine. One-time revaccination 5 years after the first dose of PPSV23 is recommended for people aged 19-64 years who have chronic kidney failure, nephrotic syndrome, asplenia, or immunocompromised conditions. People who received 1-2 doses of PPSV23 before age 30 years should receive another dose of PPSV23 vaccine at age 85 years or later if at least 5 years have passed since the previous dose. Doses of PPSV23 are not needed for people immunized with PPSV23 at or after age 57 years.   Preventive Services / Frequency  Ages 77 years and over  Blood pressure check.  Lipid and cholesterol check.  Lung cancer screening. / Every year if you are aged 69-80 years and have a 30-pack-year history of smoking and  currently smoke or have quit within the past 15 years. Yearly screening is stopped once you have quit smoking for at least 15 years or develop a health problem that would prevent you from having lung cancer treatment.  Clinical breast exam.** / Every year after age 1 years.   BRCA-related cancer risk assessment.** / For women who have family members with a BRCA-related cancer (breast, ovarian, tubal, or peritoneal cancers).  Mammogram.** / Every year beginning at age 74 years and continuing for as long as you are in good health. Consult with your health care provider.  Pap test.** / Every 3 years starting at age 59 years through age 36 or 61 years with 3 consecutive normal Pap tests. Testing can be stopped between 65 and 70 years with 3 consecutive normal Pap tests and no abnormal Pap or HPV tests in the past 10 years.  Fecal occult blood test (FOBT) of stool. / Every year beginning at age 69 years and continuing until age 70 years. You may not need to do this test if you get a colonoscopy every 10 years.  Flexible sigmoidoscopy or colonoscopy.** / Every 5 years for a flexible sigmoidoscopy or every 10 years for a colonoscopy beginning at age 58 years and continuing until age 64 years.  Hepatitis C blood test.** / For all people born from [birth year] through 1965 and any individual with known risks for hepatitis C.  Osteoporosis screening.** / A one-time screening for women ages 70 years and over and women at risk for fractures or osteoporosis.  Skin self-exam. / Monthly.  Influenza vaccine. / Every year.  Tetanus, diphtheria, and acellular  pertussis (Tdap/Td) vaccine.** / 1 dose of Td every 10 years.  Zoster vaccine.** / 1 dose for adults aged 60 years or older.  Pneumococcal 13-valent conjugate (PCV13) vaccine.** / Consult your health care provider.  Pneumococcal polysaccharide (PPSV23) vaccine.** / 1 dose for all adults aged 65 years and older. Screening for abdominal aortic aneurysm (AAA)   by ultrasound is recommended for people who have history of high blood pressure or who are current or former smokers. ++++++++++++++++++++ Recommend Adult Low Dose Aspirin or  coated  Aspirin 81 mg daily  To reduce risk of Colon Cancer 40 %,  Skin Cancer 26 % ,  Melanoma 46%  and  Pancreatic cancer 60% ++++++++++++++++++++ Vitamin D goal  is between 70-100.  Please make sure that you are taking your Vitamin D as directed.  It is very important as a natural anti-inflammatory  helping hair, skin, and nails, as well as reducing stroke and heart attack risk.  It helps your bones and helps with mood. It also decreases numerous cancer risks so please take it as directed.  Low Vit D is associated with a 200-300% higher risk for CANCER  and 200-300% higher risk for HEART   ATTACK  &  STROKE.   ...................................... It is also associated with higher death rate at younger ages,  autoimmune diseases like Rheumatoid arthritis, Lupus, Multiple Sclerosis.    Also many other serious conditions, like depression, Alzheimer's Dementia, infertility, muscle aches, fatigue, fibromyalgia - just to name a few. ++++++++++++++++++ Recommend the book "The END of DIETING" by Dr Joel Fuhrman  & the book "The END of DIABETES " by Dr Joel Fuhrman At [employer] - get book & Audio CD's    Being diabetic has a  300% increased risk for heart attack, stroke, cancer, and alzheimer- type vascular dementia. It is very important that you work harder with diet by avoiding all foods that are white. Avoid white rice (brown & wild rice is OK), white potatoes (sweetpotatoes in moderation is OK), White bread or wheat bread or anything made out of white flour like bagels, donuts, rolls, buns, biscuits, cakes, pastries, cookies, pizza crust, and pasta (made from white flour & egg whites) - vegetarian pasta or spinach or wheat pasta is OK. Multigrain breads like Arnold's or [employer], or multigrain sandwich thins  or flatbreads.  Diet, exercise and weight loss can reverse and cure diabetes in the early stages.  Diet, exercise and weight loss is very important in the control and prevention of complications of diabetes which affects every system in your body, ie. Brain - dementia/stroke, eyes - glaucoma/blindness, heart - heart attack/heart failure, kidneys - dialysis, stomach - gastric paralysis, intestines - malabsorption, nerves - severe painful neuritis, circulation - gangrene & loss of a leg(s), and finally cancer and Alzheimers.    I recommend avoid fried & greasy foods,  sweets/candy, white rice (brown or wild rice or Quinoa is OK), white potatoes (sweet potatoes are OK) - anything made from white flour - bagels, doughnuts, rolls, buns, biscuits,white and wheat breads, pizza crust and traditional pasta made of white flour & egg white(vegetarian pasta or spinach or wheat pasta is OK).  Multi-grain bread is OK - like multi-grain flat bread or sandwich thins. Avoid alcohol in excess. Exercise is also important.    Eat all the vegetables you want - avoid meat, especially red meat and dairy - especially cheese.  Cheese is the most concentrated form of trans-fats which is the worst thing to clog   up our arteries. Veggie cheese is OK which can be found in the fresh produce section at [employer] or [employer] or [employer]  +++++++++++++++++++ DASH Eating Plan  DASH stands for "Dietary Approaches to Stop Hypertension."   The DASH eating plan is a healthy eating plan that has been shown to reduce high blood pressure (hypertension). Additional health benefits may include reducing the risk of type 2 diabetes mellitus, heart disease, and stroke. The DASH eating plan may also help with weight loss. WHAT DO I NEED TO KNOW ABOUT THE DASH EATING PLAN? For the DASH eating plan, you will follow these general guidelines:  Choose foods with a percent daily value for sodium of less than 5% (as listed on the food  label).  Use salt-free seasonings or herbs instead of table salt or sea salt.  Check with your health care provider or pharmacist before using salt substitutes.  Eat lower-sodium products, often labeled as "lower sodium" or "no salt added."  Eat fresh foods.  Eat more vegetables, fruits, and low-fat dairy products.  Choose whole grains. Look for the word "whole" as the first word in the ingredient list.  Choose fish   Limit sweets, desserts, sugars, and sugary drinks.  Choose heart-healthy fats.  Eat veggie cheese   Eat more home-cooked food and less restaurant, buffet, and fast food.  Limit fried foods.  Cook foods using methods other than frying.  Limit canned vegetables. If you do use them, rinse them well to decrease the sodium.  When eating at a restaurant, ask that your food be prepared with less salt, or no salt if possible.                      WHAT FOODS CAN I EAT? Read Dr Fara Olden Fuhrman's books on The End of Dieting & The End of Diabetes  Grains Whole grain or whole wheat bread. Brown rice. Whole grain or whole wheat pasta. Quinoa, bulgur, and whole grain cereals. Low-sodium cereals. Corn or whole wheat flour tortillas. Whole grain cornbread. Whole grain crackers. Low-sodium crackers.  Vegetables Fresh or frozen vegetables (raw, steamed, roasted, or grilled). Low-sodium or reduced-sodium tomato and vegetable juices. Low-sodium or reduced-sodium tomato sauce and paste. Low-sodium or reduced-sodium canned vegetables.   Fruits All fresh, canned (in natural juice), or frozen fruits.  Protein Products  All fish and seafood.  Dried beans, peas, or lentils. Unsalted nuts and seeds. Unsalted canned beans.  Dairy Low-fat dairy products, such as skim or 1% milk, 2% or reduced-fat cheeses, low-fat ricotta or cottage cheese, or plain low-fat yogurt. Low-sodium or reduced-sodium cheeses.  Fats and Oils Tub margarines without trans fats. Light or reduced-fat mayonnaise  and salad dressings (reduced sodium). Avocado. Safflower, olive, or canola oils. Natural peanut or almond butter.  Other Unsalted popcorn and pretzels. The items listed above may not be a complete list of recommended foods or beverages. Contact your dietitian for more options.  +++++++++++++++  WHAT FOODS ARE NOT RECOMMENDED? Grains/ White flour or wheat flour White bread. White pasta. White rice. Refined cornbread. Bagels and croissants. Crackers that contain trans fat.  Vegetables  Creamed or fried vegetables. Vegetables in a . Regular canned vegetables. Regular canned tomato sauce and paste. Regular tomato and vegetable juices.  Fruits Dried fruits. Canned fruit in light or heavy syrup. Fruit juice.  Meat and Other Protein Products Meat in general - RED meat & White meat.  Fatty cuts of meat. Ribs, chicken wings, all processed meats as  bacon, sausage, bologna, salami, fatback, hot dogs, bratwurst and packaged luncheon meats.  Dairy Whole or 2% milk, cream, half-and-half, and cream cheese. Whole-fat or sweetened yogurt. Full-fat cheeses or blue cheese. Non-dairy creamers and whipped toppings. Processed cheese, cheese spreads, or cheese curds.  Condiments Onion and garlic salt, seasoned salt, table salt, and sea salt. Canned and packaged gravies. Worcestershire sauce. Tartar sauce. Barbecue sauce. Teriyaki sauce. Soy sauce, including reduced sodium. Steak sauce. Fish sauce. Oyster sauce. Cocktail sauce. Horseradish. Ketchup and mustard. Meat flavorings and tenderizers. Bouillon cubes. Hot sauce. Tabasco sauce. Marinades. Taco seasonings. Relishes.  Fats and Oils Butter, stick margarine, lard, shortening and bacon fat. Coconut, palm kernel, or palm oils. Regular salad dressings.  Pickles and olives. Salted popcorn and pretzels.  The items listed above may not be a complete list of foods and beverages to avoid.

## 2019-01-10 ENCOUNTER — Other Ambulatory Visit: Payer: Self-pay

## 2019-01-10 ENCOUNTER — Ambulatory Visit (INDEPENDENT_AMBULATORY_CARE_PROVIDER_SITE_OTHER): Payer: Medicare Other | Admitting: Internal Medicine

## 2019-01-10 VITALS — BP 128/74 | HR 96 | Temp 97.4°F | Resp 18 | Ht 64.5 in | Wt 163.0 lb

## 2019-01-10 DIAGNOSIS — Z794 Long term (current) use of insulin: Secondary | ICD-10-CM | POA: Diagnosis not present

## 2019-01-10 DIAGNOSIS — I10 Essential (primary) hypertension: Secondary | ICD-10-CM

## 2019-01-10 DIAGNOSIS — I25810 Atherosclerosis of coronary artery bypass graft(s) without angina pectoris: Secondary | ICD-10-CM

## 2019-01-10 DIAGNOSIS — I739 Peripheral vascular disease, unspecified: Secondary | ICD-10-CM | POA: Diagnosis not present

## 2019-01-10 DIAGNOSIS — Z136 Encounter for screening for cardiovascular disorders: Secondary | ICD-10-CM

## 2019-01-10 DIAGNOSIS — J0140 Acute pansinusitis, unspecified: Secondary | ICD-10-CM | POA: Diagnosis not present

## 2019-01-10 DIAGNOSIS — E1169 Type 2 diabetes mellitus with other specified complication: Secondary | ICD-10-CM | POA: Diagnosis not present

## 2019-01-10 DIAGNOSIS — E785 Hyperlipidemia, unspecified: Secondary | ICD-10-CM

## 2019-01-10 DIAGNOSIS — I5042 Chronic combined systolic (congestive) and diastolic (congestive) heart failure: Secondary | ICD-10-CM

## 2019-01-10 DIAGNOSIS — Z79899 Other long term (current) drug therapy: Secondary | ICD-10-CM

## 2019-01-10 DIAGNOSIS — E559 Vitamin D deficiency, unspecified: Secondary | ICD-10-CM | POA: Diagnosis not present

## 2019-01-10 DIAGNOSIS — E119 Type 2 diabetes mellitus without complications: Secondary | ICD-10-CM | POA: Diagnosis not present

## 2019-01-10 DIAGNOSIS — E1149 Type 2 diabetes mellitus with other diabetic neurological complication: Secondary | ICD-10-CM | POA: Diagnosis not present

## 2019-01-10 DIAGNOSIS — Z8249 Family history of ischemic heart disease and other diseases of the circulatory system: Secondary | ICD-10-CM

## 2019-01-10 DIAGNOSIS — N186 End stage renal disease: Secondary | ICD-10-CM

## 2019-01-10 DIAGNOSIS — Z1212 Encounter for screening for malignant neoplasm of rectum: Secondary | ICD-10-CM

## 2019-01-10 DIAGNOSIS — E0822 Diabetes mellitus due to underlying condition with diabetic chronic kidney disease: Secondary | ICD-10-CM | POA: Diagnosis not present

## 2019-01-10 DIAGNOSIS — M109 Gout, unspecified: Secondary | ICD-10-CM

## 2019-01-10 DIAGNOSIS — Z992 Dependence on renal dialysis: Secondary | ICD-10-CM | POA: Diagnosis not present

## 2019-01-10 DIAGNOSIS — Z87891 Personal history of nicotine dependence: Secondary | ICD-10-CM

## 2019-01-10 DIAGNOSIS — IMO0002 Reserved for concepts with insufficient information to code with codable children: Secondary | ICD-10-CM

## 2019-01-10 DIAGNOSIS — Z1211 Encounter for screening for malignant neoplasm of colon: Secondary | ICD-10-CM

## 2019-01-10 MED ORDER — DOXYCYCLINE HYCLATE 100 MG PO CAPS: ORAL_CAPSULE | ORAL | 0 refills | Status: DC

## 2019-01-11 DIAGNOSIS — E119 Type 2 diabetes mellitus without complications: Secondary | ICD-10-CM | POA: Diagnosis not present

## 2019-01-11 DIAGNOSIS — N2581 Secondary hyperparathyroidism of renal origin: Secondary | ICD-10-CM | POA: Diagnosis not present

## 2019-01-11 DIAGNOSIS — D509 Iron deficiency anemia, unspecified: Secondary | ICD-10-CM | POA: Diagnosis not present

## 2019-01-11 DIAGNOSIS — N186 End stage renal disease: Secondary | ICD-10-CM | POA: Diagnosis not present

## 2019-01-11 DIAGNOSIS — L03119 Cellulitis of unspecified part of limb: Secondary | ICD-10-CM | POA: Diagnosis not present

## 2019-01-11 DIAGNOSIS — Z992 Dependence on renal dialysis: Secondary | ICD-10-CM | POA: Diagnosis not present

## 2019-01-11 LAB — URINALYSIS, ROUTINE W REFLEX MICROSCOPIC
Bacteria, UA: NONE SEEN /HPF
Bilirubin Urine: NEGATIVE
Hgb urine dipstick: NEGATIVE
Hyaline Cast: NONE SEEN /LPF
Ketones, ur: NEGATIVE
Leukocytes,Ua: NEGATIVE
Nitrite: NEGATIVE
Specific Gravity, Urine: 1.019 (ref 1.001–1.03)
pH: 7.5 (ref 5.0–8.0)

## 2019-01-11 LAB — COMPLETE METABOLIC PANEL WITH GFR
AG Ratio: 1.4 (calc) (ref 1.0–2.5)
ALT: 17 U/L (ref 6–29)
AST: 18 U/L (ref 10–35)
Albumin: 3.6 g/dL (ref 3.6–5.1)
Alkaline phosphatase (APISO): 159 U/L — ABNORMAL HIGH (ref 37–153)
BUN/Creatinine Ratio: 11 (calc) (ref 6–22)
BUN: 35 mg/dL — ABNORMAL HIGH (ref 7–25)
CO2: 27 mmol/L (ref 20–32)
Calcium: 8.9 mg/dL (ref 8.6–10.4)
Chloride: 99 mmol/L (ref 98–110)
Creat: 3.17 mg/dL — ABNORMAL HIGH (ref 0.60–0.93)
GFR, Est African American: 16 mL/min/{1.73_m2} — ABNORMAL LOW (ref >=60)
GFR, Est Non African American: 14 mL/min/{1.73_m2} — ABNORMAL LOW (ref >=60)
Globulin: 2.5 g/dL (calc) (ref 1.9–3.7)
Glucose, Bld: 156 mg/dL — ABNORMAL HIGH (ref 65–99)
Potassium: 4 mmol/L (ref 3.5–5.3)
Sodium: 137 mmol/L (ref 135–146)
Total Bilirubin: 0.5 mg/dL (ref 0.2–1.2)
Total Protein: 6.1 g/dL (ref 6.1–8.1)

## 2019-01-11 LAB — CBC WITH DIFFERENTIAL/PLATELET
Absolute Monocytes: 741 cells/uL (ref 200–950)
Basophils Absolute: 20 cells/uL (ref 0–200)
Basophils Relative: 0.3 %
Eosinophils Absolute: 177 cells/uL (ref 15–500)
Eosinophils Relative: 2.6 %
HCT: 36.3 % (ref 35.0–45.0)
Hemoglobin: 11.8 g/dL (ref 11.7–15.5)
Lymphs Abs: 1204 cells/uL (ref 850–3900)
MCH: 29.1 pg (ref 27.0–33.0)
MCHC: 32.5 g/dL (ref 32.0–36.0)
MCV: 89.6 fL (ref 80.0–100.0)
MPV: 10.4 fL (ref 7.5–12.5)
Monocytes Relative: 10.9 %
Neutro Abs: 4658 cells/uL (ref 1500–7800)
Neutrophils Relative %: 68.5 %
Platelets: 281 10*3/uL (ref 140–400)
RBC: 4.05 10*6/uL (ref 3.80–5.10)
RDW: 15.9 % — ABNORMAL HIGH (ref 11.0–15.0)
Total Lymphocyte: 17.7 %
WBC: 6.8 10*3/uL (ref 3.8–10.8)

## 2019-01-11 LAB — VITAMIN D 25 HYDROXY (VIT D DEFICIENCY, FRACTURES): Vit D, 25-Hydroxy: 50 ng/mL (ref 30–100)

## 2019-01-11 LAB — LIPID PANEL
Cholesterol: 103 mg/dL (ref <200)
HDL: 46 mg/dL — ABNORMAL LOW (ref >=50)
LDL Cholesterol (Calc): 40 mg/dL (calc)
Non-HDL Cholesterol (Calc): 57 mg/dL (calc) (ref <130)
Total CHOL/HDL Ratio: 2.2 (calc) (ref <5.0)
Triglycerides: 87 mg/dL (ref <150)

## 2019-01-11 LAB — URIC ACID: Uric Acid, Serum: 3.6 mg/dL (ref 2.5–7.0)

## 2019-01-11 LAB — TSH: TSH: 1.79 mIU/L (ref 0.40–4.50)

## 2019-01-11 LAB — MAGNESIUM: Magnesium: 2.2 mg/dL (ref 1.5–2.5)

## 2019-01-11 LAB — HEMOGLOBIN A1C
Hgb A1c MFr Bld: 6.3 % of total Hgb — ABNORMAL HIGH (ref <5.7)
Mean Plasma Glucose: 134 (calc)
eAG (mmol/L): 7.4 (calc)

## 2019-01-13 DIAGNOSIS — D509 Iron deficiency anemia, unspecified: Secondary | ICD-10-CM | POA: Diagnosis not present

## 2019-01-13 DIAGNOSIS — L03119 Cellulitis of unspecified part of limb: Secondary | ICD-10-CM | POA: Diagnosis not present

## 2019-01-13 DIAGNOSIS — N2581 Secondary hyperparathyroidism of renal origin: Secondary | ICD-10-CM | POA: Diagnosis not present

## 2019-01-13 DIAGNOSIS — Z992 Dependence on renal dialysis: Secondary | ICD-10-CM | POA: Diagnosis not present

## 2019-01-13 DIAGNOSIS — E119 Type 2 diabetes mellitus without complications: Secondary | ICD-10-CM | POA: Diagnosis not present

## 2019-01-13 DIAGNOSIS — N186 End stage renal disease: Secondary | ICD-10-CM | POA: Diagnosis not present

## 2019-01-14 DIAGNOSIS — T782XXS Anaphylactic shock, unspecified, sequela: Secondary | ICD-10-CM | POA: Insufficient documentation

## 2019-01-15 DIAGNOSIS — D509 Iron deficiency anemia, unspecified: Secondary | ICD-10-CM | POA: Diagnosis not present

## 2019-01-15 DIAGNOSIS — E119 Type 2 diabetes mellitus without complications: Secondary | ICD-10-CM | POA: Diagnosis not present

## 2019-01-15 DIAGNOSIS — L03119 Cellulitis of unspecified part of limb: Secondary | ICD-10-CM | POA: Diagnosis not present

## 2019-01-15 DIAGNOSIS — N2581 Secondary hyperparathyroidism of renal origin: Secondary | ICD-10-CM | POA: Diagnosis not present

## 2019-01-15 DIAGNOSIS — Z992 Dependence on renal dialysis: Secondary | ICD-10-CM | POA: Diagnosis not present

## 2019-01-15 DIAGNOSIS — N186 End stage renal disease: Secondary | ICD-10-CM | POA: Diagnosis not present

## 2019-01-18 DIAGNOSIS — L03119 Cellulitis of unspecified part of limb: Secondary | ICD-10-CM | POA: Diagnosis not present

## 2019-01-18 DIAGNOSIS — E119 Type 2 diabetes mellitus without complications: Secondary | ICD-10-CM | POA: Diagnosis not present

## 2019-01-18 DIAGNOSIS — Z992 Dependence on renal dialysis: Secondary | ICD-10-CM | POA: Diagnosis not present

## 2019-01-18 DIAGNOSIS — N186 End stage renal disease: Secondary | ICD-10-CM | POA: Diagnosis not present

## 2019-01-18 DIAGNOSIS — N2581 Secondary hyperparathyroidism of renal origin: Secondary | ICD-10-CM | POA: Diagnosis not present

## 2019-01-18 DIAGNOSIS — D509 Iron deficiency anemia, unspecified: Secondary | ICD-10-CM | POA: Diagnosis not present

## 2019-01-20 DIAGNOSIS — Z992 Dependence on renal dialysis: Secondary | ICD-10-CM | POA: Diagnosis not present

## 2019-01-20 DIAGNOSIS — N186 End stage renal disease: Secondary | ICD-10-CM | POA: Diagnosis not present

## 2019-01-20 DIAGNOSIS — L03119 Cellulitis of unspecified part of limb: Secondary | ICD-10-CM | POA: Diagnosis not present

## 2019-01-20 DIAGNOSIS — E119 Type 2 diabetes mellitus without complications: Secondary | ICD-10-CM | POA: Diagnosis not present

## 2019-01-20 DIAGNOSIS — N2581 Secondary hyperparathyroidism of renal origin: Secondary | ICD-10-CM | POA: Diagnosis not present

## 2019-01-20 DIAGNOSIS — D509 Iron deficiency anemia, unspecified: Secondary | ICD-10-CM | POA: Diagnosis not present

## 2019-01-22 DIAGNOSIS — N186 End stage renal disease: Secondary | ICD-10-CM | POA: Diagnosis not present

## 2019-01-22 DIAGNOSIS — N2581 Secondary hyperparathyroidism of renal origin: Secondary | ICD-10-CM | POA: Diagnosis not present

## 2019-01-22 DIAGNOSIS — D509 Iron deficiency anemia, unspecified: Secondary | ICD-10-CM | POA: Diagnosis not present

## 2019-01-22 DIAGNOSIS — L03119 Cellulitis of unspecified part of limb: Secondary | ICD-10-CM | POA: Diagnosis not present

## 2019-01-22 DIAGNOSIS — E119 Type 2 diabetes mellitus without complications: Secondary | ICD-10-CM | POA: Diagnosis not present

## 2019-01-22 DIAGNOSIS — Z992 Dependence on renal dialysis: Secondary | ICD-10-CM | POA: Diagnosis not present

## 2019-01-25 DIAGNOSIS — N2581 Secondary hyperparathyroidism of renal origin: Secondary | ICD-10-CM | POA: Diagnosis not present

## 2019-01-25 DIAGNOSIS — N186 End stage renal disease: Secondary | ICD-10-CM | POA: Diagnosis not present

## 2019-01-25 DIAGNOSIS — L03119 Cellulitis of unspecified part of limb: Secondary | ICD-10-CM | POA: Diagnosis not present

## 2019-01-25 DIAGNOSIS — Z992 Dependence on renal dialysis: Secondary | ICD-10-CM | POA: Diagnosis not present

## 2019-01-25 DIAGNOSIS — E119 Type 2 diabetes mellitus without complications: Secondary | ICD-10-CM | POA: Diagnosis not present

## 2019-01-25 DIAGNOSIS — D509 Iron deficiency anemia, unspecified: Secondary | ICD-10-CM | POA: Diagnosis not present

## 2019-01-27 DIAGNOSIS — L03119 Cellulitis of unspecified part of limb: Secondary | ICD-10-CM | POA: Diagnosis not present

## 2019-01-27 DIAGNOSIS — N2581 Secondary hyperparathyroidism of renal origin: Secondary | ICD-10-CM | POA: Diagnosis not present

## 2019-01-27 DIAGNOSIS — N186 End stage renal disease: Secondary | ICD-10-CM | POA: Diagnosis not present

## 2019-01-27 DIAGNOSIS — Z992 Dependence on renal dialysis: Secondary | ICD-10-CM | POA: Diagnosis not present

## 2019-01-27 DIAGNOSIS — D509 Iron deficiency anemia, unspecified: Secondary | ICD-10-CM | POA: Diagnosis not present

## 2019-01-27 DIAGNOSIS — E119 Type 2 diabetes mellitus without complications: Secondary | ICD-10-CM | POA: Diagnosis not present

## 2019-01-29 DIAGNOSIS — Z992 Dependence on renal dialysis: Secondary | ICD-10-CM | POA: Diagnosis not present

## 2019-01-29 DIAGNOSIS — N2581 Secondary hyperparathyroidism of renal origin: Secondary | ICD-10-CM | POA: Diagnosis not present

## 2019-01-29 DIAGNOSIS — L03119 Cellulitis of unspecified part of limb: Secondary | ICD-10-CM | POA: Diagnosis not present

## 2019-01-29 DIAGNOSIS — N186 End stage renal disease: Secondary | ICD-10-CM | POA: Diagnosis not present

## 2019-01-29 DIAGNOSIS — D509 Iron deficiency anemia, unspecified: Secondary | ICD-10-CM | POA: Diagnosis not present

## 2019-01-29 DIAGNOSIS — E119 Type 2 diabetes mellitus without complications: Secondary | ICD-10-CM | POA: Diagnosis not present

## 2019-02-01 DIAGNOSIS — L03119 Cellulitis of unspecified part of limb: Secondary | ICD-10-CM | POA: Diagnosis not present

## 2019-02-01 DIAGNOSIS — N2581 Secondary hyperparathyroidism of renal origin: Secondary | ICD-10-CM | POA: Diagnosis not present

## 2019-02-01 DIAGNOSIS — D509 Iron deficiency anemia, unspecified: Secondary | ICD-10-CM | POA: Diagnosis not present

## 2019-02-01 DIAGNOSIS — E119 Type 2 diabetes mellitus without complications: Secondary | ICD-10-CM | POA: Diagnosis not present

## 2019-02-01 DIAGNOSIS — N186 End stage renal disease: Secondary | ICD-10-CM | POA: Diagnosis not present

## 2019-02-01 DIAGNOSIS — Z992 Dependence on renal dialysis: Secondary | ICD-10-CM | POA: Diagnosis not present

## 2019-02-03 DIAGNOSIS — N2581 Secondary hyperparathyroidism of renal origin: Secondary | ICD-10-CM | POA: Diagnosis not present

## 2019-02-03 DIAGNOSIS — N186 End stage renal disease: Secondary | ICD-10-CM | POA: Diagnosis not present

## 2019-02-03 DIAGNOSIS — L03119 Cellulitis of unspecified part of limb: Secondary | ICD-10-CM | POA: Diagnosis not present

## 2019-02-03 DIAGNOSIS — Z992 Dependence on renal dialysis: Secondary | ICD-10-CM | POA: Diagnosis not present

## 2019-02-03 DIAGNOSIS — E119 Type 2 diabetes mellitus without complications: Secondary | ICD-10-CM | POA: Diagnosis not present

## 2019-02-03 DIAGNOSIS — D509 Iron deficiency anemia, unspecified: Secondary | ICD-10-CM | POA: Diagnosis not present

## 2019-02-05 DIAGNOSIS — D509 Iron deficiency anemia, unspecified: Secondary | ICD-10-CM | POA: Diagnosis not present

## 2019-02-05 DIAGNOSIS — E119 Type 2 diabetes mellitus without complications: Secondary | ICD-10-CM | POA: Diagnosis not present

## 2019-02-05 DIAGNOSIS — N2581 Secondary hyperparathyroidism of renal origin: Secondary | ICD-10-CM | POA: Diagnosis not present

## 2019-02-05 DIAGNOSIS — L03119 Cellulitis of unspecified part of limb: Secondary | ICD-10-CM | POA: Diagnosis not present

## 2019-02-05 DIAGNOSIS — N186 End stage renal disease: Secondary | ICD-10-CM | POA: Diagnosis not present

## 2019-02-05 DIAGNOSIS — Z992 Dependence on renal dialysis: Secondary | ICD-10-CM | POA: Diagnosis not present

## 2019-02-06 DIAGNOSIS — Z992 Dependence on renal dialysis: Secondary | ICD-10-CM | POA: Diagnosis not present

## 2019-02-06 DIAGNOSIS — N186 End stage renal disease: Secondary | ICD-10-CM | POA: Diagnosis not present

## 2019-02-06 DIAGNOSIS — E1122 Type 2 diabetes mellitus with diabetic chronic kidney disease: Secondary | ICD-10-CM | POA: Diagnosis not present

## 2019-02-10 DIAGNOSIS — Z23 Encounter for immunization: Secondary | ICD-10-CM | POA: Diagnosis not present

## 2019-02-10 DIAGNOSIS — L03119 Cellulitis of unspecified part of limb: Secondary | ICD-10-CM | POA: Diagnosis not present

## 2019-02-10 DIAGNOSIS — D509 Iron deficiency anemia, unspecified: Secondary | ICD-10-CM | POA: Diagnosis not present

## 2019-02-10 DIAGNOSIS — N186 End stage renal disease: Secondary | ICD-10-CM | POA: Diagnosis not present

## 2019-02-10 DIAGNOSIS — Z992 Dependence on renal dialysis: Secondary | ICD-10-CM | POA: Diagnosis not present

## 2019-02-10 DIAGNOSIS — N2581 Secondary hyperparathyroidism of renal origin: Secondary | ICD-10-CM | POA: Diagnosis not present

## 2019-02-12 DIAGNOSIS — L03119 Cellulitis of unspecified part of limb: Secondary | ICD-10-CM | POA: Diagnosis not present

## 2019-02-12 DIAGNOSIS — Z992 Dependence on renal dialysis: Secondary | ICD-10-CM | POA: Diagnosis not present

## 2019-02-12 DIAGNOSIS — Z23 Encounter for immunization: Secondary | ICD-10-CM | POA: Diagnosis not present

## 2019-02-12 DIAGNOSIS — N2581 Secondary hyperparathyroidism of renal origin: Secondary | ICD-10-CM | POA: Diagnosis not present

## 2019-02-12 DIAGNOSIS — N186 End stage renal disease: Secondary | ICD-10-CM | POA: Diagnosis not present

## 2019-02-12 DIAGNOSIS — D509 Iron deficiency anemia, unspecified: Secondary | ICD-10-CM | POA: Diagnosis not present

## 2019-02-15 DIAGNOSIS — L03119 Cellulitis of unspecified part of limb: Secondary | ICD-10-CM | POA: Diagnosis not present

## 2019-02-15 DIAGNOSIS — Z992 Dependence on renal dialysis: Secondary | ICD-10-CM | POA: Diagnosis not present

## 2019-02-15 DIAGNOSIS — N186 End stage renal disease: Secondary | ICD-10-CM | POA: Diagnosis not present

## 2019-02-15 DIAGNOSIS — D509 Iron deficiency anemia, unspecified: Secondary | ICD-10-CM | POA: Diagnosis not present

## 2019-02-15 DIAGNOSIS — N2581 Secondary hyperparathyroidism of renal origin: Secondary | ICD-10-CM | POA: Diagnosis not present

## 2019-02-15 DIAGNOSIS — Z23 Encounter for immunization: Secondary | ICD-10-CM | POA: Diagnosis not present

## 2019-02-15 NOTE — Progress Notes (Signed)
CARDIOLOGY OFFICE NOTE  Date:  02/16/2019    [name] Date of Birth: Jan 15, 1946 Medical Record #[id number]  PCP:  Unk Pinto, MD  Cardiologist:  Servando Snare & formerly Johnsie Cancel   No chief complaint on file.   History of Present Illness: 73 y.o. f/u CAD CABG 2011 with Dr Cyndia Bent Also has PVD worse in LE and needs left fem-pop. Stage 4 kidney disease Sees [facility];s include HTN, HLD, DM.  Chronic LBBB. 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 Saw Dr Early for evaluation of her right radiocephalic fistula on 05/14/35 for infiltrations   Using Right IJ for dialysis Cr up into the 3.2 range now  Getting dialysis 3 x / week Tried peritoneal but not for her Right radial graft more mature and less painful to use with 16 g Catheter   Needs to see podiatrist for orthotics   No angina or cardiac symptoms    Past Medical History:  Diagnosis Date  . Allergy   . Arthritis    "maybe in my lower back" (08/27/2012)  . 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 Chronic combined systolic and diastolic CHF (congestive heart failure) ([place])   . CKD (chronic kidney disease), stage IV ([place])   . Coronary artery disease    a. NSTEMI in 05/2009 s/p CABG (LIMA-LAD, SVG-diagonal, SVG-OM1/OM 2).   Marland Kitchen Dyspnea    with exertion  . Family history of anesthesia complication    Mom has a hard time to wake up  . Foot ulcer ([place])    "I've had them on both feet" (08/27/2012)  . Gouty arthritis    "right index finger" (08/27/2012)  . History of radiation therapy 06/09/13-07/06/13   left breast 50Gy  . Hyperlipidemia   . Hypertension   . Infection    right second toe  . Ischemic cardiomyopathy    a. 2011: EF 40-45%. b. EF 55-60% in 04/2017 but shortly after as inpatient was 45-50%.  Marland Kitchen LBBB (left bundle branch block)   . Mitral regurgitation   . Neuropathy     Hx; of B/L feet  . NSTEMI (non-ST elevated myocardial infarction) ([place]) 05/23/2009  . Osteomyelitis of foot ([place])   . PAD (peripheral artery disease) ([facility])    a. LE PAD (patient previously elected hold off L fem-pop), prev followed by Dr. Bridgett Larsson  . PONV (postoperative nausea and vomiting)   . Sinus headache    "occasionally" (08/27/2012)  . Type II diabetes mellitus ([place])   . Vitamin D deficiency     Past Surgical History:  Procedure Laterality Date  . AMPUTATION Right 08/27/2012   Procedure: AMPUTATION RAY;  Surgeon: Newt Minion, MD;  Location: [facility];  Service: Orthopedics;  Laterality: Right;  Right Foot 5th Ray Amputation  . AMPUTATION Left 07/08/2013   Procedure: AMPUTATION RAY;  Surgeon: Newt Minion, MD;  Location: [facility];  Service: Orthopedics;  Laterality: Left;  Left Foot 2nd Ray Amputation  . AMPUTATION RAY Right 08/27/2012   5th ray/notes 08/27/2012  . AMPUTATION TOE Right 03/06/2017   Procedure: AMPUTATION TOE, INTERPHANGEAL 2ND RIGHT;  Surgeon: [name], DPM;  Location: [facility];  Service: Podiatry;  Laterality: Right;  . AV FISTULA PLACEMENT Right 11/11/2017   Procedure: RIGHT RADIOCEPHALIC  ARTERIOVENOUS FISTULA;  Surgeon: [name], MD;  Location: [facility];  Service: Vascular;  Laterality: Right;  .  BREAST LUMPECTOMY  04/20/2013   with biopsy      DR WAKEFIELD  . BREAST LUMPECTOMY WITH NEEDLE LOCALIZATION AND AXILLARY SENTINEL LYMPH NODE BX Left 04/20/2013   Procedure: LEFT BREAST WIRE GUIDED LUMPECTOMY AND AXILLARY SENTINEL LYMPH NODE BX;  Surgeon: Rolm Bookbinder, MD;  Location: [facility];  Service: General;  Laterality: Left;  . CARDIAC CATHETERIZATION  05/24/2009   Archie Endo 05/24/2009 (08/27/2012)  . CATARACT EXTRACTION W/ INTRAOCULAR LENS  IMPLANT, BILATERAL  2000's  . COLONOSCOPY W/ BIOPSIES AND POLYPECTOMY  2010  . CORONARY ARTERY BYPASS GRAFT  2011   "CABG X4" (08/27/2012)  . DENTAL SURGERY  04/30/11   "1 implant" (08/27/2012)  . DILATION AND CURETTAGE OF UTERUS    .  EYE SURGERY    . FINGER SURGERY Right    "index finger; turned out to be gout" (08/27/2012)  . IR FLUORO GUIDE CV LINE RIGHT  11/30/2017  . TEE WITHOUT CARDIOVERSION N/A 12/01/2017   Procedure: TRANSESOPHAGEAL ECHOCARDIOGRAM (TEE);  Surgeon: Lelon Perla, MD;  Location: [facility] ENDOSCOPY;  Service: Cardiovascular;  Laterality: N/A;     Medications: Current Meds  Medication Sig  . allopurinol (ZYLOPRIM) 100 MG tablet Take 1 tablet 2 x /day to Prevent Gout  . aspirin EC 81 MG tablet Take 1 tablet (81 mg total) by mouth daily.  . Aspirin-Acetaminophen-Caffeine (EXCEDRIN EXTRA STRENGTH PO) Take by mouth as needed.  Marland Kitchen b complex-vitamin c-folic acid (NEPHRO-VITE) 0.8 MG TABS tablet TAKE 1 TABLET BY MOUTH EVERY DAY (ON DIALYSIS DAYS, TAKE AFTER DIALYSIS TREATMENT)  . BD VEO INSULIN SYRINGE U/F 31G X 15/64" 1 ML MISC USE AS DIRECTED WITH INSULIN  . Cholecalciferol (VITAMIN D3) 2000 units TABS Take 4,000 Units by mouth daily.   . cinacalcet (SENSIPAR) 30 MG tablet Take 30 mg by mouth daily with breakfast. Takes 1 tablet 3 days a week.   . docusate sodium (COLACE) 100 MG capsule Take 100 mg by mouth at bedtime.  . fluticasone (FLONASE) 50 MCG/ACT nasal spray Place 2 sprays into both nostrils daily as needed for allergies or rhinitis.  Marland Kitchen glucose blood (PRODIGY NO CODING BLOOD GLUC) test strip CHECK SUGARS 3 TIMES A DAY.[id number]  . hydrALAZINE (APRESOLINE) 25 MG tablet Take 3 tablets (75 mg) 3 x /day for BP & Heart  . insulin NPH-regular Human (70-30) 100 UNIT/ML injection Inject 10-30 Units into the skin 2 (two) times daily with a meal.  . isosorbide dinitrate (ISORDIL) 10 MG tablet Take 1 tablet 3 x /day for BP & Heart  . loratadine (CLARITIN) 10 MG tablet Take 10 mg by mouth daily.  . Multiple Vitamins-Minerals (PRESERVISION AREDS 2 PO) Take 2 capsules by mouth daily.  . Probiotic Product (PROBIOTIC DAILY PO) Take 1 tablet by mouth daily.  . psyllium (METAMUCIL) 58.6 % powder Take 1 packet by mouth  3 (three) times daily.  . sevelamer carbonate (RENVELA) 800 MG tablet TAKE 2 TABLETS BY MOUTH THREE TIMES DAILY WITH MEALS  . [DISCONTINUED] doxycycline (VIBRAMYCIN) 100 MG capsule Take 1 capsule 2 x /day with meals for Infection     Allergies: Allergies  Allergen Reactions  . Atenolol Other (See Comments) and Rash    Exacerbates gout Exacerbates gout  . Adhesive [Tape] Other (See Comments)    SKIN IS VERY SENSITIVE AND BRUISES AND TEARS EASILY; PLEASE USE AN ALTERNATIVE TO TAPE!!  . Contrast Media [Iodinated Diagnostic Agents] Rash  . Iohexol Rash and Other (See Comments)  . Latex Rash    Social History:  The patient  reports that she has quit smoking. She has never used smokeless tobacco. She reports current alcohol use. She reports that she does not use drugs.   Family History: The patient's family history includes Breast cancer in her maternal aunt; Breast cancer (age of onset: 34) in her sister; Breast cancer (age of onset: 50) in her sister; Hypertension in her brother; Kidney cancer (age of onset: 53) in her brother; Throat cancer (age of onset: 69) in her father.   Review of Systems: Please see the history of present illness.   Otherwise, the review of systems is positive for none.   All other systems are reviewed and negative.   Physical Exam: VS:  BP (!) 154/64   Pulse 93   Ht 5' 4.5" (1.638 m)   Wt 162 lb (73.5 kg)   SpO2 99%   BMI 27.38 kg/m  .  BMI Body mass index is 27.38 kg/m.  Wt Readings from Last 3 Encounters:  02/16/19 162 lb (73.5 kg)  01/10/19 163 lb (73.9 kg)  07/27/18 165 lb (74.8 kg)   Affect appropriate Chronically ill white female  HEENT: right IJ dialysis catheter  Neck supple with no adenopathy JVP normal no bruits no thyromegaly Lungs clear with no wheezing and good diaphragmatic motion Heart:  S1/S2 no murmur, no rub, gallop or click PMI normal Abdomen: benighn, BS positve, no tenderness, no AAA no bruit.  No HSM or HJR Decreased  peripheral pulses worse on left  Amputation right foot 4 th/ 5 th digits  Neuro non-focal Skin warm and dry Right fifth toe amputation  Large right radial fistula with thrill     LABORATORY DATA:  EKG:   11/29/17 SR LBBB chronic no acute changes   Lab Results  Component Value Date   WBC 6.8 01/10/2019   HGB 11.8 01/10/2019   HCT 36.3 01/10/2019   PLT 281 01/10/2019   GLUCOSE 156 (H) 01/10/2019   CHOL 103 01/10/2019   TRIG 87 01/10/2019   HDL 46 (L) 01/10/2019   LDLCALC 40 01/10/2019   ALT 17 01/10/2019   AST 18 01/10/2019   NA 137 01/10/2019   K 4.0 01/10/2019   CL 99 01/10/2019   CREATININE 3.17 (H) 01/10/2019   BUN 35 (H) 01/10/2019   CO2 27 01/10/2019   TSH 1.79 01/10/2019   INR 1.17 11/30/2017   HGBA1C 6.3 (H) 01/10/2019   MICROALBUR 118.3 08/04/2017     BNP (last 3 results) Recent Labs    08/25/18 0026  BNP 825.0*    ProBNP (last 3 results) No results for input(s): PROBNP in the last 8760 hours.   Other Studies Reviewed Today:  Myoview Study Highlights 05/2017    Nuclear stress EF: 44%.  There was no ST segment deviation noted during stress.  The study is normal.  This is a low risk study.  The left ventricular ejection fraction is mildly decreased (45-54%).   Normal pharmacologic nuclear stress test with no evidence for prior infarct or ischemia.  Mildly decreased LVEF calculated at 44%.      2D Echo 05/04/17 Study Conclusions  - Left ventricle: The cavity size was normal. Wall thickness was increased in a pattern of mild LVH. Systolic function was mildly reduced. The estimated ejection fraction was in the range of 45% to 50%. Diffuse hypokinesis. Features are consistent with a pseudonormal left ventricular filling pattern, with concomitant abnormal relaxation and increased filling pressure (grade 2 diastolic dysfunction). - Aortic valve: Poorly visualized. Probably  trileaflet; mildly calcified leaflets. There was  no stenosis. - Mitral valve: Severely calcified annulus. Moderately calcified leaflets . The findings are consistent with trivial stenosis. There was moderate regurgitation. Pressure half-time: 77 ms. Mean gradient (D): 6 mm Hg. - Left atrium: The atrium was mildly dilated. - Right ventricle: The cavity size was normal. Systolic function was normal. - Pulmonary arteries: PA peak pressure: 32 mm Hg (S). - Inferior vena cava: The vessel was normal in size. The respirophasic diameter changes were in the normal range (>= 50%), consistent with normal central venous pressure.  Impressions:  - Normal LV size with mild LV hypertrophy. EF 45-50%, diffuse hypokinesis. Normal RV size and systolic function. Heavily calcified mitral valve and annulus. There was moderate mitral regurgitation and probably only trivial mitral stenosis (mean gradient 6 mmHg but PHT 77 msec suggesting valve area in normal range).    Assessment/Plan:  1.Chronic combined systolic and diastolic CHF - EF 40-34% TEE August 2019 improved with dialysis observe   2. CAD with remote MI/CABG - multiple risk factors - her Myovew from2/14/19  was felt to be low risk.  No angina can consider cath in future now that she is being dialyzed   3. HLD - on high dose statin   4. Moderate MR on TEE August 2019 no symptoms observe   5. HTN - better control noted at home. For now, no change in her regimen.   6. CKD - f/u Lorrene Reid more stable on hemodialysis with matured right radial graft now functional   7. Podiatry:  Still in splints needs to f/u with Dr Jacqualyn Posey for orthotics  Jenkins Rouge

## 2019-02-16 ENCOUNTER — Encounter: Payer: Self-pay | Admitting: Cardiovascular Disease

## 2019-02-16 ENCOUNTER — Ambulatory Visit (INDEPENDENT_AMBULATORY_CARE_PROVIDER_SITE_OTHER): Payer: Medicare Other | Admitting: Cardiovascular Disease

## 2019-02-16 ENCOUNTER — Other Ambulatory Visit: Payer: Self-pay

## 2019-02-16 VITALS — BP 154/64 | HR 93 | Ht 64.5 in | Wt 162.0 lb

## 2019-02-16 DIAGNOSIS — I25810 Atherosclerosis of coronary artery bypass graft(s) without angina pectoris: Secondary | ICD-10-CM | POA: Diagnosis not present

## 2019-02-16 DIAGNOSIS — Z951 Presence of aortocoronary bypass graft: Secondary | ICD-10-CM | POA: Diagnosis not present

## 2019-02-16 DIAGNOSIS — I340 Nonrheumatic mitral (valve) insufficiency: Secondary | ICD-10-CM | POA: Diagnosis not present

## 2019-02-16 DIAGNOSIS — N2581 Secondary hyperparathyroidism of renal origin: Secondary | ICD-10-CM | POA: Diagnosis not present

## 2019-02-16 DIAGNOSIS — N186 End stage renal disease: Secondary | ICD-10-CM | POA: Diagnosis not present

## 2019-02-16 DIAGNOSIS — Z992 Dependence on renal dialysis: Secondary | ICD-10-CM | POA: Diagnosis not present

## 2019-02-16 DIAGNOSIS — E8779 Other fluid overload: Secondary | ICD-10-CM | POA: Diagnosis not present

## 2019-02-16 NOTE — Patient Instructions (Addendum)
Medication Instructions:   *If you need a refill on your cardiac medications before your next appointment, please call your pharmacy*  Lab Work:  If you have labs (blood work) drawn today and your tests are completely normal, you will receive your results only by: . MyChart Message (if you have MyChart) OR . A paper copy in the mail If you have any lab test that is abnormal or we need to change your treatment, we will call you to review the results.  Testing/Procedures: None ordered today.   Follow-Up: At [facility], you and your health needs are our priority.  As part of our continuing mission to provide you with exceptional heart care, we have created designated Provider Care Teams.  These Care Teams include your primary Cardiologist (physician) and Advanced Practice Providers (APPs -  Physician Assistants and Nurse Practitioners) who all work together to provide you with the care you need, when you need it.  Your next appointment:   6 month(s)  The format for your next appointment:   In Person  Provider:   You may see Peter Nishan, MD or one of the following Advanced Practice Providers on your designated Care Team:    Lori Gerhardt, NP  Laura Ingold, NP  Jill McDaniel, NP  

## 2019-02-17 DIAGNOSIS — L03119 Cellulitis of unspecified part of limb: Secondary | ICD-10-CM | POA: Diagnosis not present

## 2019-02-17 DIAGNOSIS — Z23 Encounter for immunization: Secondary | ICD-10-CM | POA: Diagnosis not present

## 2019-02-17 DIAGNOSIS — D509 Iron deficiency anemia, unspecified: Secondary | ICD-10-CM | POA: Diagnosis not present

## 2019-02-17 DIAGNOSIS — N186 End stage renal disease: Secondary | ICD-10-CM | POA: Diagnosis not present

## 2019-02-17 DIAGNOSIS — Z992 Dependence on renal dialysis: Secondary | ICD-10-CM | POA: Diagnosis not present

## 2019-02-17 DIAGNOSIS — N2581 Secondary hyperparathyroidism of renal origin: Secondary | ICD-10-CM | POA: Diagnosis not present

## 2019-02-19 DIAGNOSIS — N186 End stage renal disease: Secondary | ICD-10-CM | POA: Diagnosis not present

## 2019-02-19 DIAGNOSIS — Z23 Encounter for immunization: Secondary | ICD-10-CM | POA: Diagnosis not present

## 2019-02-19 DIAGNOSIS — Z992 Dependence on renal dialysis: Secondary | ICD-10-CM | POA: Diagnosis not present

## 2019-02-19 DIAGNOSIS — N2581 Secondary hyperparathyroidism of renal origin: Secondary | ICD-10-CM | POA: Diagnosis not present

## 2019-02-19 DIAGNOSIS — D509 Iron deficiency anemia, unspecified: Secondary | ICD-10-CM | POA: Diagnosis not present

## 2019-02-19 DIAGNOSIS — L03119 Cellulitis of unspecified part of limb: Secondary | ICD-10-CM | POA: Diagnosis not present

## 2019-02-22 DIAGNOSIS — Z992 Dependence on renal dialysis: Secondary | ICD-10-CM | POA: Diagnosis not present

## 2019-02-22 DIAGNOSIS — N2581 Secondary hyperparathyroidism of renal origin: Secondary | ICD-10-CM | POA: Diagnosis not present

## 2019-02-22 DIAGNOSIS — Z23 Encounter for immunization: Secondary | ICD-10-CM | POA: Diagnosis not present

## 2019-02-22 DIAGNOSIS — L03119 Cellulitis of unspecified part of limb: Secondary | ICD-10-CM | POA: Diagnosis not present

## 2019-02-22 DIAGNOSIS — N186 End stage renal disease: Secondary | ICD-10-CM | POA: Diagnosis not present

## 2019-02-22 DIAGNOSIS — D509 Iron deficiency anemia, unspecified: Secondary | ICD-10-CM | POA: Diagnosis not present

## 2019-02-23 ENCOUNTER — Other Ambulatory Visit: Payer: Self-pay

## 2019-02-23 ENCOUNTER — Encounter (HOSPITAL_BASED_OUTPATIENT_CLINIC_OR_DEPARTMENT_OTHER): Payer: Medicare Other | Admitting: Physician Assistant

## 2019-02-23 DIAGNOSIS — I509 Heart failure, unspecified: Secondary | ICD-10-CM | POA: Insufficient documentation

## 2019-02-23 DIAGNOSIS — L97812 Non-pressure chronic ulcer of other part of right lower leg with fat layer exposed: Secondary | ICD-10-CM | POA: Insufficient documentation

## 2019-02-23 DIAGNOSIS — I132 Hypertensive heart and chronic kidney disease with heart failure and with stage 5 chronic kidney disease, or end stage renal disease: Secondary | ICD-10-CM | POA: Insufficient documentation

## 2019-02-23 DIAGNOSIS — Z8249 Family history of ischemic heart disease and other diseases of the circulatory system: Secondary | ICD-10-CM | POA: Insufficient documentation

## 2019-02-23 DIAGNOSIS — I2510 Atherosclerotic heart disease of native coronary artery without angina pectoris: Secondary | ICD-10-CM | POA: Diagnosis not present

## 2019-02-23 DIAGNOSIS — E11621 Type 2 diabetes mellitus with foot ulcer: Secondary | ICD-10-CM | POA: Insufficient documentation

## 2019-02-23 DIAGNOSIS — L97822 Non-pressure chronic ulcer of other part of left lower leg with fat layer exposed: Secondary | ICD-10-CM | POA: Diagnosis not present

## 2019-02-23 DIAGNOSIS — E1122 Type 2 diabetes mellitus with diabetic chronic kidney disease: Secondary | ICD-10-CM | POA: Insufficient documentation

## 2019-02-23 DIAGNOSIS — M1990 Unspecified osteoarthritis, unspecified site: Secondary | ICD-10-CM | POA: Diagnosis not present

## 2019-02-23 DIAGNOSIS — I890 Lymphedema, not elsewhere classified: Secondary | ICD-10-CM | POA: Insufficient documentation

## 2019-02-23 DIAGNOSIS — L97512 Non-pressure chronic ulcer of 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d: Secondary | ICD-10-CM | POA: Insufficient documentation

## 2019-02-23 DIAGNOSIS — E1151 Type 2 diabetes mellitus with diabetic peripheral angiopathy without gangrene: Secondary | ICD-10-CM | POA: Diagnosis not present

## 2019-02-23 DIAGNOSIS — E11622 Type 2 diabetes mellitus with other skin ulcer: Secondary | ICD-10-CM | POA: Insufficient documentation

## 2019-02-23 DIAGNOSIS — E1140 Type 2 diabetes mellitus with diabetic neuropathy, unspecified: Secondary | ICD-10-CM | POA: Diagnosis not present

## 2019-02-23 DIAGNOSIS — N186 End stage renal disease: Secondary | ICD-10-CM | POA: Diagnosis not present

## 2019-02-23 DIAGNOSIS — Z992 Dependence on renal dialysis: Secondary | ICD-10-CM | POA: Diagnosis not present

## 2019-02-23 DIAGNOSIS — I872 Venous insufficiency (chronic) (peripheral): Secondary | ICD-10-CM | POA: Diagnosis not present

## 2019-02-24 DIAGNOSIS — L03119 Cellulitis of unspecified part of limb: Secondary | ICD-10-CM | POA: Diagnosis not present

## 2019-02-24 DIAGNOSIS — D509 Iron deficiency anemia, unspecified: Secondary | ICD-10-CM | POA: Diagnosis not present

## 2019-02-24 DIAGNOSIS — N2581 Secondary hyperparathyroidism of renal origin: Secondary | ICD-10-CM | POA: Diagnosis not present

## 2019-02-24 DIAGNOSIS — Z23 Encounter for immunization: Secondary | ICD-10-CM | POA: Diagnosis not present

## 2019-02-24 DIAGNOSIS — N186 End stage renal disease: Secondary | ICD-10-CM | POA: Diagnosis not present

## 2019-02-24 DIAGNOSIS — Z992 Dependence on renal dialysis: Secondary | ICD-10-CM | POA: Diagnosis not present

## 2019-02-26 DIAGNOSIS — N186 End stage renal disease: Secondary | ICD-10-CM | POA: Diagnosis not present

## 2019-02-26 DIAGNOSIS — N2581 Secondary hyperparathyroidism of renal origin: Secondary | ICD-10-CM | POA: Diagnosis not present

## 2019-02-26 DIAGNOSIS — L03119 Cellulitis of unspecified part of limb: Secondary | ICD-10-CM | POA: Diagnosis not present

## 2019-02-26 DIAGNOSIS — Z992 Dependence on renal dialysis: Secondary | ICD-10-CM | POA: Diagnosis not present

## 2019-02-26 DIAGNOSIS — Z23 Encounter for immunization: Secondary | ICD-10-CM | POA: Diagnosis not present

## 2019-02-26 DIAGNOSIS — D509 Iron deficiency anemia, unspecified: Secondary | ICD-10-CM | POA: Diagnosis not present

## 2019-02-28 DIAGNOSIS — D509 Iron deficiency anemia, unspecified: Secondary | ICD-10-CM | POA: Diagnosis not present

## 2019-02-28 DIAGNOSIS — L03119 Cellulitis of unspecified part of limb: Secondary | ICD-10-CM | POA: Diagnosis not present

## 2019-02-28 DIAGNOSIS — N2581 Secondary hyperparathyroidism of renal origin: Secondary | ICD-10-CM | POA: Diagnosis not present

## 2019-02-28 DIAGNOSIS — N186 End stage renal disease: Secondary | ICD-10-CM | POA: Diagnosis not present

## 2019-02-28 DIAGNOSIS — Z992 Dependence on renal dialysis: Secondary | ICD-10-CM | POA: Diagnosis not present

## 2019-02-28 DIAGNOSIS — Z23 Encounter for immunization: Secondary | ICD-10-CM | POA: Diagnosis not present

## 2019-02-28 NOTE — Progress Notes (Signed)
Pamela, [name] ([id number]) Visit Report for 02/23/2019 Abuse/Suicide Risk Screen Details Patient Name: Date of Service: [name], [name] 02/23/2019 2:45 PM Medical Record [id number] Patient Account Number: [id number] Date of Birth/Sex: Treating RN: [birth year] (73 y.o. Female) [name] Primary Care Kam Rahimi: [name] Other Clinician: Referring Jamarion [place]: Treating Brandt Chaney/Extender:Stone III, Lemar Livings, Lendon Ka Weeks in Treatment: 0 Abuse/Suicide Risk Screen Items Answer ABUSE RISK SCREEN: Has anyone close to you tried to hurt or harm you recentlyo No Do you feel uncomfortable with anyone in your familyo No Has anyone forced you do things that you didnt want to doo No Electronic Signature(s) Signed: 02/28/2019 2:18:43 PM By: [name] RN, BSN Entered By: [name] on 02/23/2019 15:50:41 -------------------------------------------------------------------------------- Activities of Daily Living Details Patient Name: Date of Service: Pamela, [name] 02/23/2019 2:45 PM Medical Record [id number] Patient Account Number: [id number] Date of Birth/Sex: Treating RN: January 12, 1946 (73 y.o. Female) [name] Primary Care Tylisa Alcivar: [name] Other Clinician: Referring Tymara Saur: Treating Ernestine Langworthy/Extender:Stone III, Lemar Livings, Lendon Ka Weeks in Treatment: 0 Activities of Daily Living Items Answer Activities of Daily Living (Please select one for each item) Drive Automobile Need Assistance Take Medications Completely Able Use Telephone Completely Able Care for Appearance Completely Able Use Toilet Completely Able Bath / Shower Completely Able Dress Self Completely Able Feed Self Completely Able Walk Completely Able Get In / Out Bed Completely Able Housework Completely Able Prepare Meals Completely Able Handle Money Completely Able Shop for Self Completely Able Electronic Signature(s) Signed: 02/28/2019 2:18:43 PM By:  [name] RN, BSN Entered By: [name] on 02/23/2019 15:51:05 -------------------------------------------------------------------------------- Education Screening Details Patient Name: Date of Service: [name] 02/23/2019 2:45 PM Medical Record [id number] Patient Account Number: [id number] Date of Birth/Sex: Treating RN: Jul 06, 1945 (73 y.o. Female) [name] Primary Care Mirah Nevins: [name] Other Clinician: Referring Chianne Byrns: Treating Nilam Quakenbush/Extender:Stone III, Lemar Livings, Abelino Derrick in Treatment: 0 Primary Learner Assessed: Patient Learning Preferences/Education Level/Primary Language Learning Preference: Explanation, Demonstration, Printed Material Highest Education Level: College or Above Preferred Language: English Cognitive Barrier Language Barrier: No Translator Needed: No Memory Deficit: No Emotional Barrier: No Cultural/Religious Beliefs Affecting Medical Care: No Physical Barrier Impaired Vision: No Impaired Hearing: No Decreased Hand dexterity: No Knowledge/Comprehension Knowledge Level: High Comprehension Level: High Ability to understand written High instructions: Ability to understand verbal High instructions: Motivation Anxiety Level: Calm Cooperation: Cooperative Education Importance: Acknowledges Need Interest in Health Problems: Asks Questions Perception: Coherent Willingness to Engage in Self- High Management Activities: Readiness to Engage in Self- High Management Activities: Electronic Signature(s) Signed: 02/28/2019 2:18:43 PM By: [name] RN, BSN Entered By: [name] on 02/23/2019 15:51:22 -------------------------------------------------------------------------------- Fall Risk Assessment Details Patient Name: Date of Service: [name] 02/23/2019 2:45 PM Medical Record [id number] Patient Account Number: [id number] Date of Birth/Sex: Treating RN: 1945-05-22 (73  y.o. Female) [name] Primary Care Jaina Morin: [name] Other Clinician: Referring Carlo Lorson: Treating Henok Heacock/Extender:Stone III, Lemar Livings, Lendon Ka Weeks in Treatment: 0 Fall Risk Assessment Items Have you had 2 or more falls in the last 12 monthso 0 No Have you had any fall that resulted in injury in the last 12 monthso 0 No FALLS RISK SCREEN History of falling - immediate or within 3 months 0 No Secondary diagnosis (Do you have 2 or more medical diagnoseso) 15 Yes Ambulatory aid None/bed rest/wheelchair/nurse 0 No Crutches/cane/walker 15 Yes Furniture 0 No Intravenous therapy Access/Saline/Heparin Lock 0 No Weak (short steps with or without shuffle, stooped but able to  lift head 0 No while walking, may seek support from furniture) Impaired (short steps with shuffle, may have difficulty arising from chair, 0 No head down, impaired balance) Mental Status Oriented to own ability 0 Yes Overestimates or forgets limitations 0 No Risk Level: Medium Risk Score: 30 Electronic Signature(s) Signed: 02/28/2019 2:18:43 PM By: [name] RN, BSN Entered By: [name] on 02/23/2019 15:51:38 -------------------------------------------------------------------------------- Foot Assessment Details Patient Name: Date of Service: [name] 02/23/2019 2:45 PM Medical Record [id number] Patient Account Number: [id number] Date of Birth/Sex: Treating RN: [birth year] (73 y.o. Female) [name] Primary Care Molleigh Huot: [name] Other Clinician: Referring Kaela Beitz: Treating Jabar Krysiak/Extender:Stone III, Lemar Livings, Lendon Ka Weeks in Treatment: 0 Foot Assessment Items Site Locations + = Sensation present, - = Sensation absent, C = Callus, U = Ulcer R = Redness, W = Warmth, M = Maceration, PU = Pre-ulcerative lesion F = Fissure, S = Swelling, D = Dryness Assessment Right: Left: Other Deformity: No No Prior Foot Ulcer: No No Prior  Amputation: No No Charcot Joint: No No Ambulatory Status: Ambulatory With Help Assistance Device: Cane Gait: Steady Electronic Signature(s) Signed: 02/28/2019 2:18:43 PM By: [name] RN, BSN Entered By: [name] on 02/23/2019 15:54:06 -------------------------------------------------------------------------------- Nutrition Risk Screening Details Patient Name: Date of Service: [name] 02/23/2019 2:45 PM Medical Record [id number] Patient Account Number: [id number] Date of Birth/Sex: Treating RN: Jun 19, 1945 (73 y.o. Female) [name] Primary Care Haedyn Ancrum: [name] Other Clinician: Referring Korbin Mapps: Treating Evelena Masci/Extender:Stone III, Lemar Livings, Lendon Ka Weeks in Treatment: 0 Height (in): 65 Weight (lbs): 162 Body Mass Index (BMI): 27 Nutrition Risk Screening Items Score Screening NUTRITION RISK SCREEN: I have an illness or condition that made me change the kind and/or 2 Yes amount of food I eat I eat fewer than two meals per day 0 No I eat few fruits and vegetables, or milk products 0 No I have three or more drinks of beer, liquor or wine almost every day 0 No I have tooth or mouth problems that make it hard for me to eat 0 No I don't always have enough money to buy the food I need 0 No I eat alone most of the time 0 No I take three or more different prescribed or over-the-counter drugs a day 1 Yes 0 No Without wanting to, I have lost or gained 10 pounds in the last six months I am not always physically able to shop, cook and/or feed myself 0 No Nutrition Protocols Good Risk Protocol Provide education on Moderate Risk Protocol 0 nutrition High Risk Proctocol Risk Level: Moderate Risk Score: 3 Electronic Signature(s) Signed: 02/28/2019 2:18:43 PM By: [name] RN, BSN Entered By: [name] on 02/23/2019 15:51:46

## 2019-02-28 NOTE — Progress Notes (Signed)
Pamela, [name] ([id number]) Visit Report for 02/23/2019 Chief Complaint Document Details Patient Name: Date of Service: [name], [name] 02/23/2019 2:45 PM Medical Record [id number] Patient Account Number: [id number] Date of Birth/Sex: Treating RN: [birth year] (73 y.o. Female) Baruch Gouty Primary Care Provider: Kirtland [name] Other Clinician: Referring Provider: Treating Provider/Extender:Stone III, [name], [name] in Treatment: 0 Information Obtained from: Patient Chief Complaint Right foot and bilateral LE ulcers Electronic Signature(s) Signed: 02/23/2019 5:11:42 PM By: Pamela Keeler PA-C Entered By: [name] on 02/23/2019 16:05:00 -------------------------------------------------------------------------------- Debridement Details Patient Name: Date of Service: [name] 02/23/2019 2:45 PM Medical Record [id number] Patient Account Number: [id number] Date of Birth/Sex: 11-30-1945 (73 y.o. Female) Treating RN: Baruch Gouty Primary Care Provider: Kirtland [name] Other Clinician: Referring Provider: Treating Provider/Extender:Stone III, [name], [name] in Treatment: 0 Debridement Performed for Wound #5 Left,Medial Lower Leg Assessment: Performed By: Physician Pamela Keeler, PA Debridement Type: Debridement Severity of Tissue Pre Fat layer exposed Debridement: Level of Consciousness (Pre- Awake and Alert procedure): Pre-procedure Verification/Time Out Taken: Yes - 16:20 Start Time: 16:24 Pain Control: Other : Benzocaine 20% Total Area Debrided (L x W): 6 (cm) x 2.2 (cm) = 13.2 (cm) Tissue and other material Slough, Subcutaneous, Skin: Epidermis, Fibrin/Exudate, Slough debrided: Level: Skin/Subcutaneous Tissue Debridement Description: Excisional Instrument: Curette Bleeding: Minimum Hemostasis Achieved: Pressure End Time: 16:28 Procedural Pain: 5 Post Procedural Pain: 3 Response to  Treatment: Procedure was tolerated well Level of Consciousness Awake and Alert (Post-procedure): Post Debridement Measurements of Total Wound Length: (cm) 7 Width: (cm) 2.2 Depth: (cm) 0.1 Volume: (cm) 1.21 Character of Wound/Ulcer Post Improved Debridement: Severity of Tissue Post Debridement: Fat layer exposed Post Procedure Diagnosis Same as Pre-procedure Electronic Signature(s) Signed: 02/23/2019 5:11:42 PM By: Pamela Keeler PA-C Signed: 02/23/2019 5:42:21 PM By: Baruch Gouty RN, BSN Entered By: Baruch Gouty on 02/23/2019 16:28:16 -------------------------------------------------------------------------------- Debridement Details Patient Name: Date of Service: [name] 02/23/2019 2:45 PM Medical Record [id number] Patient Account Number: [id number] Date of Birth/Sex: 08-Mar-1946 (73 y.o. Female) Treating RN: Baruch Gouty Primary Care Provider: Kirtland [name] Other Clinician: Referring Provider: Treating Provider/Extender:Stone III, [name], [name] in Treatment: 0 Debridement Performed for Wound #4 Right,Medial Lower Leg Assessment: Performed By: Physician Pamela Keeler, PA Debridement Type: Debridement Severity of Tissue Pre Fat layer exposed Debridement: Level of Consciousness (Pre- Awake and Alert procedure): Pre-procedure Verification/Time Out Taken: Yes - 16:20 Start Time: 16:28 Pain Control: Other : Benzocaine 20% Total Area Debrided (L x W): 3 (cm) x 2 (cm) = 6 (cm) Tissue and other material Viable, Non-Viable, Slough, Subcutaneous, Skin: Epidermis, Fibrin/Exudate, Slough Viable, Non-Viable, Slough, Subcutaneous, Skin: Epidermis, Fibrin/Exudate, Slough debrided: Level: Skin/Subcutaneous Tissue Debridement Description: Excisional Instrument: Curette Bleeding: Minimum Hemostasis Achieved: Pressure End Time: 16:32 Procedural Pain: 5 Post Procedural Pain: 3 Response to Treatment: Procedure was tolerated  well Level of Consciousness Awake and Alert (Post-procedure): Post Debridement Measurements of Total Wound Length: (cm) 4.6 Width: (cm) 4.5 Depth: (cm) 0.1 Volume: (cm) 1.626 Character of Wound/Ulcer Post Improved Debridement: Severity of Tissue Post Debridement: Fat layer exposed Post Procedure Diagnosis Same as Pre-procedure Electronic Signature(s) Signed: 02/23/2019 5:11:42 PM By: Pamela Keeler PA-C Signed: 02/23/2019 5:42:21 PM By: Baruch Gouty RN, BSN Entered By: Baruch Gouty on 02/23/2019 16:31:04 -------------------------------------------------------------------------------- Debridement Details Patient Name: Date of Service: [name] 02/23/2019 2:45 PM Medical Record [id number] Patient Account Number: [id number] Date of Birth/Sex: 1945/08/28 (73 y.o. Female) Treating RN: Baruch Gouty Primary  Care Provider: Kirtland [name] Other Clinician: Referring Provider: Treating Provider/Extender:Stone III, [name], [name] in Treatment: 0 Debridement Performed for Wound #3 Right,Anterior Lower Leg Assessment: Performed By: Physician Pamela Keeler, PA Debridement Type: Debridement Severity of Tissue Pre Fat layer exposed Debridement: Level of Consciousness (Pre- Awake and Alert procedure): Pre-procedure Verification/Time Out Taken: Yes - 16:20 Start Time: 16:24 Pain Control: Other : Benzocaine 20% Total Area Debrided (L x W): 1.5 (cm) x 1.8 (cm) = 2.7 (cm) Tissue and other material Tissue and other material Viable, Non-Viable, Slough, Subcutaneous, Skin: Epidermis, Fibrin/Exudate, Slough debrided: Level: Skin/Subcutaneous Tissue Debridement Description: Excisional Instrument: Curette Bleeding: Minimum Hemostasis Achieved: Pressure End Time: 16:28 Procedural Pain: 5 Post Procedural Pain: 3 Response to Treatment: Procedure was tolerated well Level of Consciousness Awake and Alert (Post-procedure): Post Debridement  Measurements of Total Wound Length: (cm) 1.5 Width: (cm) 1.8 Depth: (cm) 0.1 Volume: (cm) 0.212 Character of Wound/Ulcer Post Improved Debridement: Severity of Tissue Post Debridement: Fat layer exposed Post Procedure Diagnosis Same as Pre-procedure Electronic Signature(s) Signed: 02/23/2019 5:11:42 PM By: Pamela Keeler PA-C Signed: 02/23/2019 5:42:21 PM By: Baruch Gouty RN, BSN Entered By: Baruch Gouty on 02/23/2019 16:31:40 -------------------------------------------------------------------------------- Debridement Details Patient Name: Date of Service: [name] 02/23/2019 2:45 PM Medical Record [id number] Patient Account Number: [id number] Date of Birth/Sex: 1945-10-09 (73 y.o. Female) Treating RN: Baruch Gouty Primary Care Provider: Kirtland [name] Other Clinician: Referring Provider: Treating Provider/Extender:Stone III, [name], [name] in Treatment: 0 Debridement Performed for Wound #2 Right,Plantar Foot Assessment: Performed By: Physician Pamela Keeler, PA Debridement Type: Debridement Severity of Tissue Pre Fat layer exposed Debridement: Level of Consciousness (Pre- Awake and Alert procedure): Pre-procedure Verification/Time Out Taken: Yes - 16:20 Start Time: 16:24 Pain Control: Other : Benzocaine 20% Total Area Debrided (L x W): 1.5 (cm) x 1.5 (cm) = 2.25 (cm) Tissue and other material Viable, Non-Viable, Callus, Slough, Subcutaneous, Skin: Epidermis, Slough debrided: Level: Skin/Subcutaneous Tissue Debridement Description: Excisional Instrument: Curette Bleeding: Minimum Hemostasis Achieved: Pressure End Time: 16:28 Procedural Pain: 5 Post Procedural Pain: 3 Response to Treatment: Procedure was tolerated well Level of Consciousness Awake and Alert (Post-procedure): Post Debridement Measurements of Total Wound Length: (cm) 0.8 Width: (cm) 1.2 Depth: (cm) 0.5 Volume: (cm) 0.377 Character of Wound/Ulcer  Post Improved Debridement: Severity of Tissue Post Debridement: Fat layer exposed Post Procedure Diagnosis Same as Pre-procedure Electronic Signature(s) Signed: 02/23/2019 5:11:42 PM By: Pamela Keeler PA-C Signed: 02/23/2019 5:42:21 PM By: Baruch Gouty RN, BSN Entered By: Baruch Gouty on 02/23/2019 16:33:31 -------------------------------------------------------------------------------- HPI Details Patient Name: Date of Service: [name] 02/23/2019 2:45 PM Medical Record [id number] Patient Account Number: [id number] Date of Birth/Sex: Treating RN: Mar 28, 1946 (73 y.o. Female) Baruch Gouty Primary Care Provider: Kirtland [name] Other Clinician: Referring Provider: Treating Provider/Extender:Stone III, [name], [name] in Treatment: 0 History of Present Illness HPI Description: 02/23/2019 on evaluation today patient presents for initial evaluation in our clinic concerning issues that she has been having with her bilateral lower extremities as well as her plantar foot on the right. This has been quite some time in regard to the foot ulcer even back as far as the beginning of this year that is 2020. She has had the wounds on her legs a much shorter time but that still has been roughly 2-3 months. Nonetheless she is really not shown signs of a lot of improvement. She tells me that she does have a past medical history of diabetes,  venous insufficiency, lymphedema which is mild but nonetheless present, peripheral vascular disease and she has previously been recommended to have a femoral-popliteal bypass but put that off at that point they are basically just monitoring at this time. She has end-stage renal disease with dependence on renal dialysis, hypertension, and obviously wounds of the bilateral lower extremities at this point. She tells me that she is having some discomfort but fortunately nothing too significant at this point which is good news.  No fevers, chills, nausea, vomiting, or diarrhea. She is mainly been leaving these areas open to air as much as possible at this point at 1 point she was trying to keep them covered more but that really did not help either. Fortunately there is no signs of active systemic nor local infection at this time. Electronic Signature(s) Signed: 02/23/2019 5:11:42 PM By: Pamela Keeler PA-C Entered By: [name] on 02/23/2019 17:05:42 -------------------------------------------------------------------------------- Physical Exam Details Patient Name: Date of Service: NELSON, JULSON 02/23/2019 2:45 PM Medical Record [id number] Patient Account Number: [id number] Date of Birth/Sex: Treating RN: September 28, 1945 (73 y.o. Female) Baruch Gouty Primary Care Provider: Kirtland [name] Other Clinician: Referring Provider: Treating Provider/Extender:Stone III, [name], [name] in Treatment: 0 Constitutional sitting or standing blood pressure is within target range for patient.. pulse regular and within target range for patient.Marland Kitchen respirations regular, non-labored and within target range for patient.Marland Kitchen temperature within target range for patient.. Well-nourished and well-hydrated in no acute distress. Eyes conjunctiva clear no eyelid edema noted. pupils equal round and reactive to light and accommodation. Ears, Nose, Mouth, and Throat no gross abnormality of ear auricles or external auditory canals. normal hearing noted during conversation. mucus membranes moist. Respiratory normal breathing without difficulty. clear to auscultation bilaterally. Cardiovascular regular rate and rhythm with normal S1, S2. trace posterior tibial and dorsalis pedis pulses bilateral lower extremities. no clubbing, cyanosis, significant edema, <3 sec cap refill. Gastrointestinal (GI) soft, non-tender, non-distended, +BS. no ventral hernia noted. Musculoskeletal normal gait and posture. no  significant deformity or arthritic changes, no loss or range of motion, no clubbing. Psychiatric this patient is able to make decisions and demonstrates good insight into disease process. Alert and Oriented x 3. pleasant and cooperative. Notes Patient's wounds currently on the lower extremities did require sharp debridement to try to clear away some of the necrotic tissue. I was able to perform some debridement to clear some of this away but unfortunately not everything was achievable mostly due to pain but some of it due to the fact that it was very hard and dried on. With that being said I think that we need to try something dressing wise to try and help to soften up some of the necrotic tissue. That was discussed with the patient today and reviewed to see about initiation of treatment orders to help in that regard at this point. Electronic Signature(s) Signed: 02/23/2019 5:11:42 PM By: Pamela Keeler PA-C Entered By: [name] on 02/23/2019 17:08:14 -------------------------------------------------------------------------------- Physician Orders Details Patient Name: Date of Service: [name] 02/23/2019 2:45 PM Medical Record [id number] Patient Account Number: [id number] Date of Birth/Sex: Treating RN: Feb 13, 1946 (73 y.o. Female) Baruch Gouty Primary Care Provider: Kirtland [name] Other Clinician: Referring Provider: Treating Provider/Extender:Stone III, [name], [name] in Treatment: 0 Verbal / Phone Orders: No Diagnosis Coding ICD-10 Coding Code Description E11.621 Type 2 diabetes mellitus with foot ulcer L97.512 Non-pressure chronic ulcer of 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d I87.2  Venous insufficiency (chronic) (peripheral) I89.0 Lymphedema, not elsewhere classified L97.812 Non-pressure chronic ulcer of other part of right lower leg with fat layer exposed L97.822 Non-pressure chronic ulcer of other part of left lower leg  with fat layer exposed I73.89 Other specified peripheral vascular diseases N18.6 End stage renal disease Z99.2 Dependence on renal dialysis I10 Essential (primary) hypertension Follow-up Appointments Return Appointment in 1 week. Dressing Change Frequency Wound #2 Right,Plantar Foot Do not change entire dressing for one week. Wound #3 Right,Anterior Lower Leg Do not change entire dressing for one week. Wound #4 Right,Medial Lower Leg Do not change entire dressing for one week. Wound #5 Left,Medial Lower Leg Do not change entire dressing for one week. Wound #6 Left,Proximal,Medial Lower Leg Do not change entire dressing for one week. Skin Barriers/Peri-Wound Care Moisturizing lotion - both legs Wound Cleansing May shower with protection. Primary Wound Dressing Wound #3 Right,Anterior Lower Leg Santyl Ointment Hydrogel - thin layer over santyl covered with adaptic Wound #4 Right,Medial Lower Leg Santyl Ointment Hydrogel - thin layer over santyl covered with adaptic Wound #5 Left,Medial Lower Leg Santyl Ointment Hydrogel - thin layer over santyl covered with adaptic Wound #6 Left,Proximal,Medial Lower Leg Santyl Ointment Hydrogel - thin layer over santyl covered with adaptic Wound #2 Right,Plantar Foot Silver Collagen Secondary Dressing Wound #2 Right,Plantar Foot Foam - foam donut Kerlix/Rolled Gauze Dry Gauze Drawtex - cut to fit inside wound edges to hold collagen in place Wound #3 Right,Anterior Lower Leg Dry Gauze ABD pad Wound #4 Right,Medial Lower Leg Dry Gauze ABD pad Wound #5 Left,Medial Lower Leg Dry Gauze ABD pad Wound #6 Left,Proximal,Medial Lower Leg Dry Gauze ABD pad Edema Control Kerlix and Coban - Bilateral Avoid standing for long periods of time Elevate legs to the level of the heart or above for 30 minutes daily and/or when sitting, a frequency of: - whenever sitting Exercise regularly Electronic Signature(s) Signed: 02/23/2019 5:11:42 PM  By: Pamela Keeler PA-C Signed: 02/23/2019 5:42:21 PM By: Baruch Gouty RN, BSN Entered By: Baruch Gouty on 02/23/2019 16:38:31 -------------------------------------------------------------------------------- Problem List Details Patient Name: Date of Service: [name] 02/23/2019 2:45 PM Medical Record [id number] Patient Account Number: [id number] Date of Birth/Sex: Treating RN: 22-Jan-1946 (73 y.o. Female) Baruch Gouty Primary Care Provider: Kirtland [name] Other Clinician: Referring Provider: Treating Provider/Extender:Stone III, [name], [name] in Treatment: 0 Active Problems ICD-10 Evaluated Encounter Code Description Active Date Today Diagnosis E11.621 Type 2 diabetes mellitus with foot ulcer 02/23/2019 No Yes L97.512 Non-pressure chronic ulcer of other part of right foot 02/23/2019 No Yes with fat layer exposed I87.2 Venous insufficiency (chronic) (peripheral) 02/23/2019 No Yes I89.0 Lymphedema, not elsewhere classified 02/23/2019 No Yes L97.812 Non-pressure chronic ulcer of other part of right lower 02/23/2019 No Yes leg with fat layer exposed L97.822 Non-pressure chronic ulcer of other part of left lower 02/23/2019 No Yes leg with fat layer exposed I73.89 Other specified peripheral vascular diseases 02/23/2019 No Yes N18.6 End stage renal disease 02/23/2019 No Yes Z99.2 Dependence on renal dialysis 02/23/2019 No Yes I10 Essential (primary) hypertension 02/23/2019 No Yes Inactive Problems Resolved Problems Electronic Signature(s) Signed: 02/23/2019 5:11:42 PM By: Pamela Keeler PA-C Entered By: [name] on 02/23/2019 16:09:04 -------------------------------------------------------------------------------- Progress Note Details Patient Name: Date of Service: [name] 02/23/2019 2:45 PM Medical Record [id number] Patient Account Number: [id number] Date of Birth/Sex: Treating RN: [birth year] (73 y.o. Female)  Baruch Gouty Primary Care Provider: Kirtland [name] Other Clinician: Referring Provider: Treating Provider/Extender:Stone III, [name],  [name] in Treatment: 0 Subjective Chief Complaint Information obtained from Patient Right foot and bilateral LE ulcers History of Present Illness (HPI) 02/23/2019 on evaluation today patient presents for initial evaluation in our clinic concerning issues that she has been having with her bilateral lower extremities as well as her plantar foot on the right. This has been quite some time in regard to the foot ulcer even back as far as the beginning of this year that is 2020. She has had the wounds on her legs a much shorter time but that still has been roughly 2-3 months. Nonetheless she is really not shown signs of a lot of improvement. She tells me that she does have a past medical history of diabetes, venous insufficiency, lymphedema which is mild but nonetheless present, peripheral vascular disease and she has previously been recommended to have a femoral-popliteal bypass but put that off at that point they are basically just monitoring at this time. She has end-stage renal disease with dependence on renal dialysis, hypertension, and obviously wounds of the bilateral lower extremities at this point. She tells me that she is having some discomfort but fortunately nothing too significant at this point which is good news. No fevers, chills, nausea, vomiting, or diarrhea. She is mainly been leaving these areas open to air as much as possible at this point at 1 point she was trying to keep them covered more but that really did not help either. Fortunately there is no signs of active systemic nor local infection at this time. Patient History Information obtained from Patient. Allergies atenolol (Severity: Moderate, Reaction: rash, exacerbates gout), Iodinated Contrast Media (Severity: Moderate, Reaction: Rash), iohexol (Severity: Moderate,  Reaction: rash), latex (Severity: Moderate, Reaction: rash) Family History Cancer - Father,Siblings, Diabetes - Maternal Grandparents,Paternal Grandparents,Mother,Father,Siblings, Heart Disease - Maternal Grandparents,Paternal Grandparents,Mother,Father,Siblings, Hypertension - Maternal Grandparents,Paternal Grandparents, Kidney Disease - Siblings, No family history of Hereditary Spherocytosis, Lung Disease, Seizures, Stroke, Thyroid Problems, Tuberculosis. Social History Never smoker, Marital Status - Married, Alcohol Use - Never, Drug Use - No History, Caffeine Use - Rarely. Medical History Eyes Patient has history of Cataracts - removed Cardiovascular Patient has history of Congestive Heart Failure, Coronary Artery Disease, Hypertension, Peripheral Arterial Disease, Peripheral Venous Disease Endocrine Patient has history of Type II Diabetes Genitourinary Patient has history of End Stage Renal Disease - Hemodialysis 3x/week Integumentary (Skin) Denies history of History of Burn Musculoskeletal Patient has history of Osteoarthritis Neurologic Patient has history of Neuropathy Oncologic Patient has history of Received Radiation Patient is treated with Insulin. Blood sugar is tested. Medical And Surgical History Notes Cardiovascular Hyperlipidemia, Cardiomyopathy Oncologic Breast cancer 2014 with lumpectomy Review of Systems (ROS) Constitutional Symptoms (General Health) Denies complaints or symptoms of Fatigue, Fever, Chills, Marked Weight Change. Ear/Nose/Mouth/Throat Denies complaints or symptoms of Chronic sinus problems or rhinitis. Respiratory Denies complaints or symptoms of Chronic or frequent coughs, Shortness of Breath. Gastrointestinal Denies complaints or symptoms of Frequent diarrhea, Nausea, Vomiting. Genitourinary Denies complaints or symptoms of Frequent urination. Integumentary (Skin) Complains or has symptoms of Wounds - multiple wounds on bilateral lower  extremities. Psychiatric Denies complaints or symptoms of Claustrophobia, Suicidal. Objective Constitutional sitting or standing blood pressure is within target range for patient.. pulse regular and within target range for patient.Marland Kitchen respirations regular, non-labored and within target range for patient.Marland Kitchen temperature within target range for patient.. Well-nourished and well-hydrated in no acute distress. Vitals Time Taken: 3:08 PM, Height: 65 in, Source: Stated, Weight: 162 lbs, Source: Stated, BMI: 27, Temperature: 98.0  F, Pulse: 110 bpm, Respiratory Rate: 18 breaths/min, Blood Pressure: 138/63 mmHg, Capillary Blood Glucose: 120 mg/dl. Eyes conjunctiva clear no eyelid edema noted. pupils equal round and reactive to light and accommodation. Ears, Nose, Mouth, and Throat no gross abnormality of ear auricles or external auditory canals. normal hearing noted during conversation. mucus membranes moist. Respiratory normal breathing without difficulty. clear to auscultation bilaterally. Cardiovascular regular rate and rhythm with normal S1, S2. trace posterior tibial and dorsalis pedis pulses bilateral lower extremities. no clubbing, cyanosis, significant edema, Gastrointestinal (GI) soft, non-tender, non-distended, +BS. no ventral hernia noted. Musculoskeletal normal gait and posture. no significant deformity or arthritic changes, no loss or range of motion, no clubbing. Psychiatric this patient is able to make decisions and demonstrates good insight into disease process. Alert and Oriented x 3. pleasant and cooperative. General Notes: Patient's wounds currently on the lower extremities did require sharp debridement to try to clear away some of the necrotic tissue. I was able to perform some debridement to clear some of this away but unfortunately not everything was achievable mostly due to pain but some of it due to the fact that it was very hard and dried on. With that being said I think  that we need to try something dressing wise to try and help to soften up some of the necrotic tissue. That was discussed with the patient today and reviewed to see about initiation of treatment orders to help in that regard at this point. Integumentary (Hair, Skin) Wound #2 status is Open. Original cause of wound was Gradually Appeared. The wound is located on the [place]. The wound measures 0.8cm length x 1cm width x 0.5cm depth; 0.628cm^2 area and 0.314cm^3 volume. There is Fat Layer (Subcutaneous Tissue) Exposed exposed. There is no tunneling or undermining noted. There is a medium amount of serosanguineous drainage noted. The wound margin is flat and intact. There is large (67-100%) red granulation within the wound bed. There is a small (1-33%) amount of necrotic tissue within the wound bed including Adherent Slough. Wound #3 status is Open. Original cause of wound was Blister. The wound is located on the Right,Anterior Lower Leg. The wound measures 1.5cm length x 1.8cm width x 0.1cm depth; 2.121cm^2 area and 0.212cm^3 volume. There is Fat Layer (Subcutaneous Tissue) Exposed exposed. There is no tunneling or undermining noted. There is a medium amount of serosanguineous drainage noted. The wound margin is flat and intact. There is large (67-100%) pink granulation within the wound bed. There is no necrotic tissue within the wound bed. Wound #4 status is Open. Original cause of wound was Blister. The wound is located on the Right,Medial Lower Leg. The wound measures 4.6cm length x 4.5cm width x 0.1cm depth; 16.258cm^2 area and 1.626cm^3 volume. There is Fat Layer (Subcutaneous Tissue) Exposed exposed. There is no tunneling or undermining noted. There is a medium amount of serosanguineous drainage noted. The wound margin is flat and intact. There is small (1-33%) pink granulation within the wound bed. There is a large (67-100%) amount of necrotic tissue within the wound bed including  Eschar and Adherent Slough. Wound #5 status is Open. Original cause of wound was Blister. The wound is located on the Left,Medial Lower Leg. The wound measures 7cm length x 2.2cm width x 0.1cm depth; 12.095cm^2 area and 1.21cm^3 volume. There is Fat Layer (Subcutaneous Tissue) Exposed exposed. There is no tunneling or undermining noted. There is a medium amount of serosanguineous drainage noted. The wound margin is flat and intact. There  is small (1-33%) pink granulation within the wound bed. There is a large (67-100%) amount of necrotic tissue within the wound bed including Eschar and Adherent Slough. Wound #6 status is Open. Original cause of wound was Blister. The wound is located on the Left,Proximal,Medial Lower Leg. The wound measures 1cm length x 0.6cm width x 0.1cm depth; 0.471cm^2 area and 0.047cm^3 volume. There is Fat Layer (Subcutaneous Tissue) Exposed exposed. There is no tunneling or undermining noted. There is a medium amount of serosanguineous drainage noted. The wound margin is flat and intact. There is large (67-100%) pink granulation within the wound bed. There is a small (1-33%) amount of necrotic tissue within the wound bed including Adherent Slough. Assessment Active Problems ICD-10 Type 2 diabetes mellitus with foot ulcer Non-pressure chronic ulcer of 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d Venous insufficiency (chronic) (peripheral) Lymphedema, not elsewhere classified Non-pressure chronic ulcer of other part of right lower leg with fat layer exposed Non-pressure chronic ulcer of other part of left lower leg with fat layer exposed Other specified peripheral vascular diseases End stage renal disease Dependence on renal dialysis Essential (primary) hypertension Procedures Wound #2 Pre-procedure diagnosis of Wound #2 is a Diabetic Wound/Ulcer of the Lower Extremity located on the [place] .Severity of Tissue Pre Debridement is: Fat layer exposed.  There was a Excisional Skin/Subcutaneous Tissue Debridement with a total area of 2.25 sq cm performed by Pamela Keeler, PA. With the following instrument(s): Curette to remove Viable and Non-Viable tissue/material. Material removed includes Callus, Subcutaneous Tissue, Slough, and Skin: Epidermis after achieving pain control using Other (Benzocaine 20%). No specimens were taken. A time out was conducted at 16:20, prior to the start of the procedure. A Minimum amount of bleeding was controlled with Pressure. The procedure was tolerated well with a pain level of 5 throughout and a pain level of 3 following the procedure. Post Debridement Measurements: 0.8cm length x 1.2cm width x 0.5cm depth; 0.377cm^3 volume. Character of Wound/Ulcer Post Debridement is improved. Severity of Tissue Post Debridement is: Fat layer exposed. Post procedure Diagnosis Wound #2: Same as Pre-Procedure Wound #3 Pre-procedure diagnosis of Wound #3 is a Venous Leg Ulcer located on the Right,Anterior Lower Leg .Severity of Tissue Pre Debridement is: Fat layer exposed. There was a Excisional Skin/Subcutaneous Tissue Debridement with a total area of 2.7 sq cm performed by Pamela Keeler, PA. With the following instrument(s): Curette to remove Viable and Non-Viable tissue/material. Material removed includes Subcutaneous Tissue, Slough, Skin: Epidermis, and Fibrin/Exudate after achieving pain control using Other (Benzocaine 20%). No specimens were taken. A time out was conducted at 16:20, prior to the start of the procedure. A Minimum amount of bleeding was controlled with Pressure. The procedure was tolerated well with a pain level of 5 throughout and a pain level of 3 following the procedure. Post Debridement Measurements: 1.5cm length x 1.8cm width x 0.1cm depth; 0.212cm^3 volume. Character of Wound/Ulcer Post Debridement is improved. Severity of Tissue Post Debridement is: Fat layer exposed. Post procedure Diagnosis  Wound #3: Same as Pre-Procedure Wound #4 Pre-procedure diagnosis of Wound #4 is a Venous Leg Ulcer located on the Right,Medial Lower Leg .Severity of Tissue Pre Debridement is: Fat layer exposed. There was a Excisional Skin/Subcutaneous Tissue Debridement with a total area of 6 sq cm performed by Pamela Keeler, PA. With the following instrument(s): Curette to remove Viable and Non-Viable tissue/material. Material removed includes Subcutaneous Tissue, Slough, Skin: Epidermis, and Fibrin/Exudate after achieving pain control using Other (  Benzocaine 20%). No specimens were taken. A time out was conducted at 16:20, prior to the start of the procedure. A Minimum amount of bleeding was controlled with Pressure. The procedure was tolerated well with a pain level of 5 throughout and a pain level of 3 following the procedure. Post Debridement Measurements: 4.6cm length x 4.5cm width x 0.1cm depth; 1.626cm^3 volume. Character of Wound/Ulcer Post Debridement is improved. Severity of Tissue Post Debridement is: Fat layer exposed. Post procedure Diagnosis Wound #4: Same as Pre-Procedure Wound #5 Pre-procedure diagnosis of Wound #5 is a Venous Leg Ulcer located on the Left,Medial Lower Leg .Severity of Tissue Pre Debridement is: Fat layer exposed. There was a Excisional Skin/Subcutaneous Tissue Debridement with a total area of 13.2 sq cm performed by Pamela Keeler, PA. With the following instrument(s): Curette Material removed includes Subcutaneous Tissue, Slough, Skin: Epidermis, and Fibrin/Exudate after achieving pain control using Other (Benzocaine 20%). No specimens were taken. A time out was conducted at 16:20, prior to the start of the procedure. A Minimum amount of bleeding was controlled with Pressure. The procedure was tolerated well with a pain level of 5 throughout and a pain level of 3 following the procedure. Post Debridement Measurements: 7cm length x 2.2cm width x 0.1cm depth; 1.21cm^3  volume. Character of Wound/Ulcer Post Debridement is improved. Severity of Tissue Post Debridement is: Fat layer exposed. Post procedure Diagnosis Wound #5: Same as Pre-Procedure Plan Follow-up Appointments: Return Appointment in 1 week. Dressing Change Frequency: Wound #2 Right,Plantar Foot: Do not change entire dressing for one week. Wound #3 Right,Anterior Lower Leg: Do not change entire dressing for one week. Wound #4 Right,Medial Lower Leg: Do not change entire dressing for one week. Wound #5 Left,Medial Lower Leg: Do not change entire dressing for one week. Wound #6 Left,Proximal,Medial Lower Leg: Do not change entire dressing for one week. Skin Barriers/Peri-Wound Care: Moisturizing lotion - both legs Wound Cleansing: May shower with protection. Primary Wound Dressing: Wound #3 Right,Anterior Lower Leg: Santyl Ointment Hydrogel - thin layer over santyl covered with adaptic Wound #4 Right,Medial Lower Leg: Santyl Ointment Hydrogel - thin layer over santyl covered with adaptic Wound #5 Left,Medial Lower Leg: Santyl Ointment Hydrogel - thin layer over santyl covered with adaptic Wound #6 Left,Proximal,Medial Lower Leg: Santyl Ointment Hydrogel - thin layer over santyl covered with adaptic Wound #2 Right,Plantar Foot: Silver Collagen Secondary Dressing: Wound #2 Right,Plantar Foot: Foam - foam donut Kerlix/Rolled Gauze Dry Gauze Drawtex - cut to fit inside wound edges to hold collagen in place Wound #3 Right,Anterior Lower Leg: Dry Gauze ABD pad Wound #4 Right,Medial Lower Leg: Dry Gauze ABD pad Wound #5 Left,Medial Lower Leg: Dry Gauze ABD pad Wound #6 Left,Proximal,Medial Lower Leg: Dry Gauze ABD pad Edema Control: Kerlix and Coban - Bilateral Avoid standing for long periods of time Elevate legs to the level of the heart or above for 30 minutes daily and/or when sitting, a frequency of: - whenever sitting Exercise regularly 1. My suggestion at this  time is good to be that we go ahead and initiate treatment with a combination of Santyl with hydrogel over top covered with Adaptic to try to help soften up some of the necrotic tissue. 2. I would recommend as well that we cover the area with foam in regard to the foot ulcer and use a silver collagen dressing for this location to try to help stimulate offloading as well as additional new skin and epithelial growth. 3. We will use a light Kerlix and  Coban wrap on the lower extremity to help with edema control at this point in time. This we bilaterally. We will see patient back for reevaluation in 1 week here in the clinic. If anything worsens or changes patient will contact our office for additional recommendations. Electronic Signature(s) Signed: 02/23/2019 5:11:42 PM By: Pamela Keeler PA-C Entered By: [name] on 02/23/2019 17:10:36 -------------------------------------------------------------------------------- [facility] Details Patient Name: Date of Service: [name] 02/23/2019 2:45 PM Medical Record [id number] Patient Account Number: [id number] Date of Birth/Sex: Treating RN: [birth year] (73 y.o. Female) [name] Primary Care Provider: Kirtland [name] Other Clinician: Referring Provider: Treating Provider/Extender:Stone III, [name], [name] in Treatment: 0 Information Obtained From Patient Constitutional Symptoms (General Health) Complaints and Symptoms: Negative for: Fatigue; Fever; Chills; Marked Weight Change Ear/Nose/Mouth/Throat Complaints and Symptoms: Negative for: Chronic sinus problems or rhinitis Respiratory Complaints and Symptoms: Negative for: Chronic or frequent coughs; Shortness of Breath Gastrointestinal Complaints and Symptoms: Negative for: Frequent diarrhea; Nausea; Vomiting Genitourinary Complaints and Symptoms: Negative for: Frequent urination Medical History: Positive for: End Stage Renal Disease -  Hemodialysis 3x/week Integumentary (Skin) Complaints and Symptoms: Positive for: Wounds - multiple wounds on bilateral lower extremities Medical History: Negative for: History of Burn Psychiatric Complaints and Symptoms: Negative for: Claustrophobia; Suicidal Eyes Medical History: Positive for: Cataracts - removed Hematologic/Lymphatic Cardiovascular Medical History: Positive for: Congestive Heart Failure; Coronary Artery Disease; Hypertension; Peripheral Arterial Disease; Peripheral Venous Disease Past Medical History Notes: Hyperlipidemia, Cardiomyopathy Endocrine Medical History: Positive for: Type II Diabetes Time with diabetes: since 2000 Treated with: Insulin Blood sugar tested every day: Yes Tested : 1-2x a day Immunological Musculoskeletal Medical History: Positive for: Osteoarthritis Neurologic Medical History: Positive for: Neuropathy Oncologic Medical History: Positive for: Received Radiation Past Medical History Notes: Breast cancer 2014 with lumpectomy HBO Extended History Items Eyes: Cataracts Immunizations Pneumococcal Vaccine: Received Pneumococcal Vaccination: Yes Tetanus Vaccine: Last tetanus shot: 01/13/2012 Implantable Devices None Family and Social History Cancer: Yes - Father,Siblings; Diabetes: Yes - Maternal Grandparents,Paternal Grandparents,Mother,Father,Siblings; Heart Disease: Yes - Maternal Grandparents,Paternal Grandparents,Mother,Father,Siblings; Hereditary Spherocytosis: No; Hypertension: Yes - Maternal Grandparents,Paternal Grandparents; Kidney Disease: Yes - Siblings; Lung Disease: No; Seizures: No; Stroke: No; Thyroid Problems: No; Tuberculosis: No; Never smoker; Marital Status - Married; Alcohol Use: Never; Drug Use: No History; Caffeine Use: Rarely; Financial Concerns: No; Food, Clothing or Shelter Needs: No; Support System Lacking: No; Transportation Concerns: No Electronic Signature(s) Signed: 02/23/2019 5:11:42 PM By:  Pamela Keeler PA-C Signed: 02/28/2019 2:18:43 PM By: [name] RN, BSN Entered By: [name] on 02/23/2019 16:14:13 -------------------------------------------------------------------------------- [place] Details Patient Name: Date of Service: [name] 02/23/2019 Medical Record [id number] Patient Account Number: [id number] Date of Birth/Sex: Treating RN: 09/30/1945 (73 y.o. Female) Baruch Gouty Primary Care Provider: Kirtland [name] Other Clinician: Referring Provider: Treating Provider/Extender:Stone III, [name], [name] in Treatment: 0 Diagnosis Coding ICD-10 Codes Code Description E11.621 Type 2 diabetes mellitus with foot ulcer L97.512 Non-pressure chronic ulcer of 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d I87.2 Venous insufficiency (chronic) (peripheral) I89.0 Lymphedema, not elsewhere classified L97.812 Non-pressure chronic ulcer of other part of right lower leg with fat layer exposed L97.822 Non-pressure chronic ulcer of other part of left lower leg with fat layer exposed I73.89 Other specified peripheral vascular diseases N18.6 End stage renal disease Z99.2 Dependence on renal dialysis I10 Essential (primary) hypertension Facility Procedures CPT4 Code Description: 12458099 99213 - WOUND CARE VISIT-LEV 3 EST PT Modifier: 25 Quantity: 1 CPT4 Code Description: 83382505  11042 - DEB SUBQ TISSUE 20 SQ CM/< ICD-10 Diagnosis Description L97.512 Non-pressure chronic ulcer of other part of right foot wi L97.812 Non-pressure chronic ulcer of other part of right lower l L97.822 Non-pressure  chronic ulcer of other part of left lower le Modifier: th fat layer ex eg with fat lay g with fat laye Quantity: 1 posed er exposed r exposed CPT4 Code Description: 60156153 11045 - DEB SUBQ TISS EA ADDL 20CM ICD-10 Diagnosis Description L97.512 Non-pressure chronic ulcer of other part of right foot wi L97.812 Non-pressure chronic ulcer of  other part of right lower l L97.822 Non-pressure  chronic ulcer of other part of left lower le Modifier: th fat layer ex eg with fat lay g with fat laye Quantity: 1 posed er exposed r exposed Physician Procedures CPT4 Code Description: 7943276 14709 - WC PHYS LEVEL 4 - NEW PT ICD-10 Diagnosis Description E11.621 Type 2 diabetes mellitus with foot ulcer L97.512 Non-pressure chronic ulcer of other part of right foot wi I87.2 Venous insufficiency (chronic)  (peripheral) I89.0 Lymphedema, not elsewhere classified Modifier: 25 th fat layer ex Quantity: 1 posed CPT4 Code Description: 2957473 11042 - WC PHYS SUBQ TISS 20 SQ CM ICD-10 Diagnosis Description L97.512 Non-pressure chronic ulcer of other part of right foot wi L97.812 Non-pressure chronic ulcer of other part of right lower l L97.822 Non-pressure  chronic ulcer of other part of left lower le Modifier: th fat layer ex eg with fat lay g with fat laye Quantity: 1 posed er exposed r exposed CPT4 Code Description: 4037096 11045 - WC PHYS SUBQ TISS EA ADDL 20 CM ICD-10 Diagnosis Description L97.512 Non-pressure chronic ulcer of other part of right foot wi L97.812 Non-pressure chronic ulcer of other part of right lower l L97.822 Non-pressure  chronic ulcer of other part of left lower le Modifier: th fat layer ex eg with fat lay g with fat laye Quantity: 1 posed er exposed r exposed Electronic Signature(s) Signed: 02/23/2019 5:11:42 PM By: Pamela Keeler PA-C Entered By: [name] on 02/23/2019 17:11:05

## 2019-03-01 ENCOUNTER — Ambulatory Visit: Payer: Medicare Other | Admitting: Adult Health

## 2019-03-01 ENCOUNTER — Encounter: Payer: Self-pay | Admitting: Adult Health

## 2019-03-01 ENCOUNTER — Other Ambulatory Visit: Payer: Self-pay

## 2019-03-01 VITALS — BP 130/60 | Temp 97.7°F | Wt 162.0 lb

## 2019-03-01 DIAGNOSIS — I25810 Atherosclerosis of coronary artery bypass graft(s) without angina pectoris: Secondary | ICD-10-CM

## 2019-03-01 DIAGNOSIS — M545 Low back pain: Secondary | ICD-10-CM

## 2019-03-01 DIAGNOSIS — G8929 Other chronic pain: Secondary | ICD-10-CM

## 2019-03-01 DIAGNOSIS — G4709 Other insomnia: Secondary | ICD-10-CM | POA: Diagnosis not present

## 2019-03-01 MED ORDER — HYDROCODONE-ACETAMINOPHEN 5-325 MG PO TABS: 0.5000 | ORAL_TABLET | Freq: Four times a day (QID) | ORAL | 0 refills | Status: DC | PRN

## 2019-03-01 MED ORDER — TRAZODONE HCL 50 MG PO TABS: ORAL_TABLET | ORAL | 2 refills | Status: DC

## 2019-03-01 NOTE — Progress Notes (Signed)
Virtual Visit via Telephone Note  I connected with [name] on 03/01/19 at  3:30 PM EST by telephone and verified that I am speaking with the correct person using two identifiers.  Location: Patient: home Provider: Marybelle Killings   I discussed the limitations, risks, security and privacy concerns of performing an evaluation and management service by telephone and the availability of in person appointments. I also discussed with the patient that there may be a patient responsible charge related to this service. The patient expressed understanding and agreed to proceed.   History of Present Illness: \BP 130/60   Temp 97.7 F (36.5 C)   Wt 162 lb (73.5 kg)   BMI 27.38 kg/m   73 y.o. with T2DM, CHF with ESRD (followed by Dr. Lorrene Reid) requested phone visit due to high risk in covid 19 to discuss chronic limiting pain. She reports wide spread pain, shoulders (hx of rotator cuff tear with residual weakness), neck, chronic lower back pain without radiation/weakness, bil foot pain/neuropathic pain limiting ADLs and recently unable to sleep due to this.   She is taking aspirin/tylenol/caffeine - 250mg /250 mg/65 mg - taking ~2 caps TID PRN, used to work reasonably well but recently no longer working. She cannot take NSAIDs or gabapentin secondary to ESRD. She has tried extra strength tylenol without benefit. She has tried topical lidocaine patches without benefit. She has theraworks topical but hasn't tried it. Hasn't tried other topicals.   She has had amputations r/t diabetes; has seen Dr. Colvin Caroli - podiatry but hasn't followed recently.    She reports had hydrocodone/acetaminophen 5/325 mg tabs leftover from surgery in 05/2018 and has been using this sparingly but down to 2 tabs. This has allowed her to sleep when pain is severe in the past. She is not recently established with any ortho provider. She is interested in discussing her conditions with Dr. Ninfa Linden who cares for her husband and she is  very impressed with.   She has taken melatonin for sleep but hasn't found this very helpful. Hasn't tried benadryl recently for sleep but not helpful in the past.    Lab Results  Component Value Date   GFRNONAA 14 (L) 01/10/2019   Last A1C in the office was:  Lab Results  Component Value Date   HGBA1C 6.3 (H) 01/10/2019    Allergies:  Allergies  Allergen Reactions  . Atenolol Other (See Comments) and Rash    Exacerbates gout Exacerbates gout  . Adhesive [Tape] Other (See Comments)    SKIN IS VERY SENSITIVE AND BRUISES AND TEARS EASILY; PLEASE USE AN ALTERNATIVE TO TAPE!!  . Contrast Media [Iodinated Diagnostic Agents] Rash  . Iohexol Rash and Other (See Comments)  . Latex Rash   Medical History:  has Hyperlipidemia; Essential hypertension; Congestive heart failure ([place]); Peripheral vascular disease ([place]); EMPHYSEMA; Asthma; Malignant neoplasm of upper-inner quadrant of left breast in female, estrogen receptor positive ([place]); Gouty arthritis; Vitamin D deficiency; Type II diabetes mellitus with neurological manifestations, uncontrolled ([place]); Medication management; BMI 29.0-29.9,adult; Chronic kidney disease (CKD), active medical management without dialysis, stage 5 ([place]); Diabetes mellitus type 2, insulin dependent ([place]); Anemia; Coronary artery disease involving coronary bypass graft of native heart without angina pectoris; Bradycardia; Mitral valve mass; ESRD (end stage renal disease) ([place]); Generalized weakness; Hyperlipidemia associated with type 2 diabetes mellitus ([place]); and Diabetes mellitus due to underlying condition with chronic kidney disease on chronic dialysis, with long-term current use of insulin ([place]) on their problem list. Surgical History:  She  has a past surgical history that includes Cataract extraction w/ intraocular lens  implant, bilateral (2000's); Finger surgery (Right); Coronary artery bypass graft (2011); Dental surgery (04/30/11); AMPUTATION RAY (Right,  08/27/2012); Cardiac catheterization (05/24/2009); Amputation (Right, 08/27/2012); Eye surgery; Colonoscopy w/ biopsies and polypectomy (2010); Breast lumpectomy (04/20/2013); Breast lumpectomy with needle localization and axillary sentinel lymph node bx (Left, 04/20/2013); Dilation and curettage of uterus; Amputation (Left, 07/08/2013); Amputation toe (Right, 03/06/2017); AV fistula placement (Right, 11/11/2017); IR Fluoro Guide CV Line Right (11/30/2017); and TEE without cardioversion (N/A, 12/01/2017). Family History:  Herfamily history includes Breast cancer in her maternal aunt; Breast cancer (age of onset: 64) in her sister; Breast cancer (age of onset: 77) in her sister; Hypertension in her brother; Kidney cancer (age of onset: 77) in her brother; Throat cancer (age of onset: 41) in her father. Social History:   reports that she has quit smoking. She has never used smokeless tobacco. She reports current alcohol use. She reports that she does not use drugs.    Observations/Objective:  General : Well sounding patient in no apparent distress HEENT: no hoarseness, no cough for duration of visit Lungs: speaks in complete sentences, no audible wheezing, no apparent distress Neurological: alert, oriented x 3 Psychiatric: pleasant, judgement appropriate    Assessment and Plan:  Abreanna was seen today for other and arthritis.  Diagnoses and all orders for this visit:  Chronic bilateral low back pain without sciatica She has numerous chronic pain complaints, management complicated by ESRD, requested telephone management due to high risk covid 19  She is using a large quantity of excedrine (no longer working) - I did express my concern with high dose caffeine and aspirin intake, to discuss with neph, but recommended she reduce to take 1 excedrin with extra strength tylenol  Will refer to Dr. Ninfa Linden to discuss widespread pain, but she understands options may be limited and may need to further refer on to  pain management Discussed numerous OTC topical agents including topical arthritis creams, voltaren gel, capsaicin topical creams, salon pas lidocaine patches Will refill 5 day supply of norco after reviewed PDMP; she understands to use SPARINGLY, risks with overdose and tolerance, use for severe pain only. I will not be able to provide any further refills.  -     HYDROcodone-acetaminophen (NORCO/VICODIN) 5-325 MG tablet; Take 0.5-1 tablets by mouth every 6 (six) hours as needed for severe pain. -     Ambulatory referral to Orthopedics  Other insomnia Insomnia- good sleep hygiene discussed, increase day time activity, try benadryl if this does not help can try trazodone as this is safe for ESRD.  Follow up with any questions or concerns.  -     traZODone (DESYREL) 50 MG tablet; 1/2-2 tablets 1 hour prior to bedtime for sleep.   Follow Up Instructions:    I discussed the assessment and treatment plan with the patient. The patient was provided an opportunity to ask questions and all were answered. The patient agreed with the plan and demonstrated an understanding of the instructions.   The patient was advised to call back or seek an in-person evaluation if the symptoms worsen or if the condition fails to improve as anticipated.  Future Appointments  Date Time Provider [place]  03/02/2019  9:45 AM Jeri Cos [facility]  05/04/2019 11:15 AM Liane Comber, NP [facility] None  08/03/2019  2:30 PM Unk Pinto, MD [facility] None  02/02/2020  3:00 PM Unk Pinto, MD [facility] None  I provided 30 minutes of non-face-to-face time during this encounter.   [name], NP

## 2019-03-02 ENCOUNTER — Other Ambulatory Visit: Payer: Self-pay

## 2019-03-02 ENCOUNTER — Encounter (HOSPITAL_BASED_OUTPATIENT_CLINIC_OR_DEPARTMENT_OTHER): Payer: Medicare Other | Attending: Physician Assistant | Admitting: Physician Assistant

## 2019-03-02 DIAGNOSIS — E11621 Type 2 diabetes mellitus with foot ulcer: Secondary | ICD-10-CM | POA: Diagnosis not present

## 2019-03-02 DIAGNOSIS — E1122 Type 2 diabetes mellitus with diabetic chronic kidney disease: Secondary | ICD-10-CM | POA: Diagnosis not present

## 2019-03-02 DIAGNOSIS — L97512 Non-pressure chronic ulcer of 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d: Secondary | ICD-10-CM | POA: Diagnosis not present

## 2019-03-02 DIAGNOSIS — Z992 Dependence on renal dialysis: Secondary | ICD-10-CM | POA: Diagnosis not present

## 2019-03-02 DIAGNOSIS — E11622 Type 2 diabetes mellitus with other skin ulcer: Secondary | ICD-10-CM | POA: Diagnosis not present

## 2019-03-02 DIAGNOSIS — L97822 Non-pressure chronic ulcer of other part of left lower leg with fat layer exposed: Secondary | ICD-10-CM | POA: Diagnosis not present

## 2019-03-02 DIAGNOSIS — I872 Venous insufficiency (chronic) (peripheral): Secondary | ICD-10-CM | POA: Diagnosis not present

## 2019-03-02 DIAGNOSIS — N2581 Secondary hyperparathyroidism of renal origin: Secondary | ICD-10-CM | POA: Diagnosis not present

## 2019-03-02 DIAGNOSIS — L97812 Non-pressure chronic ulcer of other part of right lower leg with fat layer exposed: Secondary | ICD-10-CM | POA: Diagnosis not present

## 2019-03-02 DIAGNOSIS — D509 Iron deficiency anemia, unspecified: Secondary | ICD-10-CM | POA: Diagnosis not present

## 2019-03-02 DIAGNOSIS — Z23 Encounter for immunization: Secondary | ICD-10-CM | POA: Diagnosis not present

## 2019-03-02 DIAGNOSIS — L03119 Cellulitis of unspecified part of limb: Secondary | ICD-10-CM | POA: Diagnosis not present

## 2019-03-02 DIAGNOSIS — N186 End stage renal disease: Secondary | ICD-10-CM | POA: Diagnosis not present

## 2019-03-02 NOTE — Progress Notes (Addendum)
KAYDRA, BORGEN ([id number]) Visit Report for 03/02/2019 Arrival Information Details Patient Name: Date of Service: [name], [name] 03/02/2019 9:45 AM Medical Record [id number] Patient Account Number: [id number] Date of Birth/Sex: Treating RN: [birth year] (73 y.o. Helene Shoe, Meta.Reding Primary Care Becky Colan: [name] Other Clinician: Referring Deondria Puryear: Treating Rahul Malinak/Extender:Stone III, Lemar Livings, Abelino Derrick in Treatment: 1 Visit Information History Since Last Visit Added or deleted any medications: No Patient Arrived: Kasandra Knudsen Any new allergies or adverse reactions: No Arrival Time: 10:43 Had a fall or experienced change in No Accompanied By: self activities of daily living that may affect Transfer Assistance: None risk of falls: Patient Identification Verified: Yes Signs or symptoms of abuse/neglect since last No Secondary Verification Process Yes visito Completed: Hospitalized since last visit: No Patient Requires Transmission- No Implantable device outside of the clinic excluding No Based Precautions: cellular tissue based products placed in the center Patient Has Alerts: Yes since last visit: Patient Alerts: R ABI non Has Dressing in Place as Prescribed: Yes compressible Has Compression in Place as Prescribed: Yes L ABI non Pain Present Now: No compressible Electronic Signature(s) Signed: 03/02/2019 3:16:44 PM By: Deon Pilling Entered By: Deon Pilling on 03/02/2019 10:44:32 -------------------------------------------------------------------------------- Lower Extremity Assessment Details Patient Name: Date of Service: LUVERTA, KORTE 03/02/2019 9:45 AM Medical Record [id number] Patient Account Number: [id number] Date of Birth/Sex: Treating RN: [birth year] (73 y.o. Debby Bud Primary Care Magalie Almon: [name] Other Clinician: Referring Lismary Kiehn: Treating Anmarie Fukushima/Extender:Stone III, Lemar Livings, Lendon Ka Weeks in  Treatment: 1 Edema Assessment Assessed: [Left: Yes] [Right: Yes] Edema: [Left: Yes] [Right: Yes] Calf Left: Right: Point of Measurement: 32 cm From Medial Instep 30 cm 31 cm Ankle Left: Right: Point of Measurement: 10 cm From Medial Instep 19 cm 19 cm Vascular Assessment Pulses: Dorsalis Pedis Palpable: [Left:Yes] [Right:Yes] Electronic Signature(s) Signed: 03/02/2019 3:16:44 PM By: Deon Pilling Entered By: Deon Pilling on 03/02/2019 10:50:52 -------------------------------------------------------------------------------- [place] Details Patient Name: Date of Service: [name] 03/02/2019 9:45 AM Medical Record [id number] Patient Account Number: [id number] Date of Birth/Sex: Treating RN: 09/24/1945 (73 y.o. [name] Primary Care Megham Dwyer: [name] Other Clinician: Referring Aidan Moten: Treating Earlisha Sharples/Extender:Stone III, Lemar Livings, Lendon Ka Weeks in Treatment: 1 Active Inactive Abuse / Safety / Falls / Self Care Management Nursing Diagnoses: Potential for falls Goals: Patient/caregiver will verbalize/demonstrate measures taken to prevent injury and/or falls Date Initiated: 02/23/2019 Target Resolution Date: 03/23/2019 Goal Status: Active Interventions: Assess fall risk on admission and as needed Notes: Nutrition Nursing Diagnoses: Impaired glucose control: actual or potential Potential for alteratiion in Nutrition/Potential for imbalanced nutrition Goals: Patient/caregiver will maintain therapeutic glucose control Date Initiated: 02/23/2019 Target Resolution Date: 03/23/2019 Goal Status: Active Interventions: Assess HgA1c results as ordered upon admission and as needed Provide education on elevated blood sugars and impact on wound healing Treatment Activities: Patient referred to Primary Care Physician for further nutritional evaluation : 02/23/2019 Notes: Venous Leg Ulcer Nursing  Diagnoses: Knowledge deficit related to disease process and management Potential for venous Insuffiency (use before diagnosis confirmed) Goals: Patient will maintain optimal edema control Date Initiated: 02/23/2019 Target Resolution Date: 03/23/2019 Goal Status: Active Interventions: Compression as ordered Treatment Activities: Therapeutic compression applied : 02/23/2019 Notes: Wound/Skin Impairment Nursing Diagnoses: Impaired tissue integrity Knowledge deficit related to ulceration/compromised skin integrity Goals: Patient/caregiver will verbalize understanding of skin care regimen Date Initiated: 02/23/2019 Target Resolution Date: 03/23/2019 Goal Status: Active Ulcer/skin breakdown will have a volume reduction of 30% by week 4 Date Initiated: 02/23/2019  Target Resolution Date: 03/23/2019 Goal Status: Active Interventions: Assess patient/caregiver ability to obtain necessary supplies Assess patient/caregiver ability to perform ulcer/skin care regimen upon admission and as needed Assess ulceration(s) every visit Treatment Activities: Skin care regimen initiated : 02/23/2019 Topical wound management initiated : 02/23/2019 Notes: Electronic Signature(s) Signed: 03/02/2019 3:14:28 PM By: Baruch Gouty RN, BSN Entered By: Baruch Gouty on 03/02/2019 11:14:24 -------------------------------------------------------------------------------- Pain Assessment Details Patient Name: Date of Service: [name] 03/02/2019 9:45 AM Medical Record [id number] Patient Account Number: [id number] Date of Birth/Sex: Treating RN: [birth year] (73 y.o. Debby Bud Primary Care Ahni Bradwell: [name] Other Clinician: Referring Alaija Ruble: Treating Sheylin Scharnhorst/Extender:Stone III, Lemar Livings, Lendon Ka Weeks in Treatment: 1 Active Problems Location of Pain Severity and Description of Pain Patient Has Paino No Site Locations Rate the pain. Current Pain Level:  0 Pain Management and Medication Current Pain Management: Medication: No Cold Application: No Rest: No Massage: No Activity: No T.E.N.S.: No Heat Application: No Leg drop or elevation: No Is the Current Pain Management Adequate: Adequate How does your wound impact your activities of daily livingo Sleep: No Bathing: No Appetite: No Relationship With Others: No Bladder Continence: No Emotions: No Bowel Continence: No Work: No Toileting: No Drive: No Dressing: No Hobbies: No Electronic Signature(s) Signed: 03/02/2019 3:16:44 PM By: Deon Pilling Entered By: Deon Pilling on 03/02/2019 10:59:10 -------------------------------------------------------------------------------- Patient/Caregiver Education Details Patient Name: Date of Service: [name] 11/25/2020andnbsp9:45 AM Medical Record Patient Account Number: [id number] Number: Treating RN: Baruch Gouty May 14, 1945 (73 y.o. Other Clinician: Date of Birth/Gender: F) Treating Worthy Keeler Primary Care Physician: [name] Physician/Extender: Referring Physician: Stevphen Meuse in Treatment: 1 Education Assessment Education Provided To: Patient Education Topics Provided Elevated Blood Sugar/ Impact on Healing: Methods: Explain/Verbal Responses: Reinforcements needed, State content correctly Venous: Methods: Explain/Verbal Responses: Reinforcements needed, State content correctly Wound/Skin Impairment: Methods: Explain/Verbal Responses: Reinforcements needed, State content correctly Electronic Signature(s) Signed: 03/02/2019 3:14:28 PM By: Baruch Gouty RN, BSN Entered By: Baruch Gouty on 03/02/2019 11:14:51 -------------------------------------------------------------------------------- Wound Assessment Details Patient Name: Date of Service: [name] 03/02/2019 9:45 AM Medical Record [id number] Patient Account Number: [id number] Date of  Birth/Sex: Treating RN: [birth year] (73 y.o. Helene Shoe, Meta.Reding Primary Care Sapphire Tygart: [name] Other Clinician: Referring Serafin Dec[place]: Treating Harl Wiechmann/Extender:Stone III, Lemar Livings, Lendon Ka Weeks in Treatment: 1 Wound Status Wound Number: 2 Primary Diabetic Wound/Ulcer of the Lower Extremity Etiology: Wound Location: Right Foot - Plantar Wound Open Wounding Event: Gradually Appeared Status: Status: Date Acquired: 12/06/2017 Comorbid Cataracts, Congestive Heart Failure, Coronary Weeks Of Treatment: 1 History: Artery Disease, Hypertension, Peripheral Clustered Wound: No Arterial Disease, Peripheral Venous Disease, Type II Diabetes, End Stage Renal Disease, Osteoarthritis, Neuropathy, Received Radiation Photos Wound Measurements Length: (cm) 0.4 Width: (cm) 0.9 Depth: (cm) 0.4 Area: (cm) 0.283 Volume: (cm) 0.113 Wound Description Classification: Grade 2 Wound Margin: Thickened Exudate Amount: Medium Exudate Type: Serosanguineous Exudate Color: red, brown Wound Bed Granulation Amount: Large (67-100%) Granulation Quality: Red Necrotic Amount: None Present (0%) After Cleansing: No brino No Exposed Structure osed: No (Subcutaneous Tissue) Exposed: Yes osed: No osed: No sed: No ed: No % Reduction in Area: 54.9% % Reduction in Volume: 64% Epithelialization: None Tunneling: No Undermining: No Foul Odor Slough/Fi Fascia Exp Fat Layer Tendon Exp Muscle Exp Joint Expo Bone Expos Electronic Signature(s) Signed: 03/07/2019 4:19:23 PM By: Mikeal Hawthorne EMT/HBOT Signed: 03/07/2019 6:19:57 PM By: Deon Pilling Previous Signature: 03/02/2019 3:16:44 PM Version By: Deon Pilling Entered By: Mikeal Hawthorne on 03/07/2019  08:57:05 -------------------------------------------------------------------------------- Wound Assessment Details Patient Name: Date of Service: RHONDALYN, CLINGAN 03/02/2019 9:45 AM Medical Record [id number] Patient Account  Number: [id number] Date of Birth/Sex: Treating RN: 10-14-1945 (73 y.o. Helene Shoe, Meta.Reding Primary Care Jayci Ellefson: Other Clinician: [name] Referring Shanen Norris: Treating Octavie Westerhold/Extender:Stone III, Lemar Livings, Lendon Ka Weeks in Treatment: 1 Wound Status Wound Number: 3 Primary Venous Leg Ulcer Etiology: Wound Location: Right Lower Leg - Anterior Wound Open Wounding Event: Blister Status: Date Acquired: 01/06/2019 Comorbid Cataracts, Congestive Heart Failure, Coronary Weeks Of Treatment: 1 History: Artery Disease, Hypertension, Peripheral Clustered Wound: No Arterial Disease, Peripheral Venous Disease, Type II Diabetes, End Stage Renal Disease, Osteoarthritis, Neuropathy, Received Radiation Photos Wound Measurements Length: (cm) 1 % Reduct Width: (cm) 1.8 % Reduct Depth: (cm) 0.1 Epitheli Area: (cm) 1.414 Tunneli Volume: (cm) 0.141 Undermi Wound Description Full Thickness Without Exposed Support Classification: Structures Wound Flat and Intact Margin: Exudate Medium Amount: Exudate Serosanguineous Type: Exudate red, brown Color: Wound Bed Granulation Amount: Large (67-100%) Granulation Quality: Pink Necrotic Amount: None Present (0%) Foul Odor After Cleansing: No Slough/Fibrino No Exposed Structure Fascia Exposed: No Fat Layer (Subcutaneous Tissue) Exposed: Yes Tendon Exposed: No Muscle Exposed: No Joint Exposed: No Bone Exposed: No ion in Area: 33.3% ion in Volume: 33.5% alization: None ng: No ning: No Electronic Signature(s) Signed: 03/07/2019 4:19:23 PM By: Mikeal Hawthorne EMT/HBOT Signed: 03/07/2019 6:19:57 PM By: Deon Pilling Previous Signature: 03/02/2019 3:16:44 PM Version By: Deon Pilling Entered By: Mikeal Hawthorne on 03/07/2019 08:57:26 -------------------------------------------------------------------------------- Wound Assessment Details Patient Name: Date of Service: [name] 03/02/2019 9:45 AM Medical Record  [id number] Patient Account Number: [id number] Date of Birth/Sex: Treating RN: Aug 12, 1945 (73 y.o. Helene Shoe, Meta.Reding Primary Care Analyce Tavares: [name] Other Clinician: Referring Quinnten Calvin: Treating Pax Reasoner/Extender:Stone III, Lemar Livings, Lendon Ka Weeks in Treatment: 1 Wound Status Wound Number: 4 Primary Venous Leg Ulcer Etiology: Wound Location: Right Lower Leg - Medial Wound Open Wounding Event: Blister Status: Date Acquired: 01/06/2019 Comorbid Cataracts, Congestive Heart Failure, Coronary Weeks Of Treatment: 1 History: Artery Disease, Hypertension, Peripheral Clustered Wound: No Arterial Disease, Peripheral Venous Disease, Type II Diabetes, End Stage Renal Disease, Osteoarthritis, Neuropathy, Received Radiation Photos Wound Measurements Length: (cm) 4.3 % Reduct Width: (cm) 4.5 % Reduct Depth: (cm) 0.1 Epitheli Area: (cm) 15.197 Tunneli Volume: (cm) 1.52 Undermi Wound Description Full Thickness Without Exposed Support Foul Od Classification: Structures Slough/ Wound Flat and Intact Margin: Exudate Medium Amount: Exudate Serosanguineous Type: Exudate red, brown Color: Wound Bed Granulation Amount: Small (1-33%) Granulation Quality: Pink Fascia Exp Necrotic Amount: Large (67-100%) Fat Layer Necrotic Quality: Adherent Slough Tendon Exp Muscle Exp Joint Expo Bone Expos or After Cleansing: No Fibrino Yes Exposed Structure osed: No (Subcutaneous Tissue) Exposed: Yes osed: No osed: No sed: No ed: No ion in Area: 6.5% ion in Volume: 6.5% alization: None ng: No ning: No Electronic Signature(s) Signed: 03/07/2019 4:19:23 PM By: Mikeal Hawthorne EMT/HBOT Signed: 03/07/2019 6:19:57 PM By: Deon Pilling Previous Signature: 03/02/2019 3:16:44 PM Version By: Deon Pilling Entered By: Mikeal Hawthorne on 03/07/2019 08:58:56 -------------------------------------------------------------------------------- Wound Assessment Details Patient  Name: Date of Service: [name] 03/02/2019 9:45 AM Medical Record [id number] Patient Account Number: [id number] Date of Birth/Sex: Treating RN: 1945/09/29 (73 y.o. Debby Bud Primary Care Eldean Nanna: [name] Other Clinician: Referring Juan Kissoon: Treating Mehgan Santmyer/Extender:Stone III, Lemar Livings, Lendon Ka Weeks in Treatment: 1 Wound Status Wound Number: 5 Primary Venous Leg Ulcer Etiology: Wound Location: Left Lower Leg - Medial Wound Open Wounding Event: Blister Status: Date  Acquired: 01/06/2019 Comorbid Cataracts, Congestive Heart Failure, Coronary Weeks Of Treatment: 1 History: Artery Disease, Hypertension, Peripheral Clustered Wound: No Arterial Disease, Peripheral Venous Disease, Type II Diabetes, End Stage Renal Disease, Osteoarthritis, Neuropathy, Received Radiation Photos Wound Measurements Length: (cm) 2.3 % Reductio Width: (cm) 2 % Reductio Depth: (cm) 0.1 Epithelial Area: (cm) 3.613 Tunneling Volume: (cm) 0.361 Undermi Wound Description Classification: Full Thickness Without Exposed Support Foul Od Structures Slough/ Wound Flat and Intact Margin: Exudate Medium Amount: Exudate Serosanguineous Type: Exudate red, brown Color: Wound Bed Granulation Amount: Small (1-33%) Granulation Quality: Pink Fascia Necrotic Amount: Large (67-100%) Fat Lay Necrotic Quality: Adherent Slough Tendon Muscle Joint E Bone Ex or After Cleansing: No Fibrino Yes Exposed Structure Exposed: No er (Subcutaneous Tissue) Exposed: Yes Exposed: No Exposed: No xposed: No posed: No n in Area: 70.1% n in Volume: 70.2% ization: None : No ning: No Electronic Signature(s) Signed: 03/07/2019 4:19:23 PM By: Mikeal Hawthorne EMT/HBOT Signed: 03/07/2019 6:19:57 PM By: Deon Pilling Previous Signature: 03/02/2019 3:16:44 PM Version By: Deon Pilling Entered By: Mikeal Hawthorne on 03/07/2019  08:59:17 -------------------------------------------------------------------------------- Wound Assessment Details Patient Name: Date of Service: [name] 03/02/2019 9:45 AM Medical Record [id number] Patient Account Number: [id number] Date of Birth/Sex: Treating RN: [birth year] (73 y.o. Helene Shoe, Meta.Reding Primary Care Monserrate Blaschke: [name] Other Clinician: Referring Joeanthony Seeling: Treating Rhen Kawecki/Extender:Stone III, Lemar Livings, Lendon Ka Weeks in Treatment: 1 Wound Status Wound Number: 6 Primary Venous Leg Ulcer Etiology: Wound Location: Left Lower Leg - Medial, Proximal Wound Open Wounding Event: Blister Status: Date Acquired: 01/06/2019 Comorbid Cataracts, Congestive Heart Failure, Coronary Weeks Of Treatment: 1 History: Artery Disease, Hypertension, Peripheral Clustered Wound: No Arterial Disease, Peripheral Venous Disease, Type II Diabetes, End Stage Renal Disease, Osteoarthritis, Neuropathy, Received Radiation Photos Wound Measurements Length: (cm) 2.5 % Reduct Width: (cm) 1.7 % Reduct Depth: (cm) 0.1 Epitheli Area: (cm) 3.338 Tunneli Volume: (cm) 0.334 Undermi Wound Description Classification: Full Thickness Without Exposed Support Foul Od Structures Slough/ Wound Flat and Intact Margin: Exudate Medium Amount: Exudate Serosanguineous Type: Exudate red, brown Color: Wound Bed Granulation Amount: None Present (0%) Necrotic Amount: Large (67-100%) Fascia Necrotic Quality: Adherent Slough Fat Lay Tendon Muscle Joint E Bone Ex or After Cleansing: No Fibrino No Exposed Structure Exposed: No er (Subcutaneous Tissue) Exposed: Yes Exposed: No Exposed: No xposed: No posed: No ion in Area: -608.7% ion in Volume: -610.6% alization: None ng: No ning: No Electronic Signature(s) Signed: 03/07/2019 4:19:23 PM By: Mikeal Hawthorne EMT/HBOT Signed: 03/07/2019 6:19:57 PM By: Deon Pilling Previous Signature: 03/02/2019 3:16:44 PM Version  By: Deon Pilling Entered By: Mikeal Hawthorne on 03/07/2019 08:59:37 -------------------------------------------------------------------------------- Vitals Details Patient Name: Date of Service: [name] 03/02/2019 9:45 AM Medical Record [id number] Patient Account Number: [id number] Date of Birth/Sex: Treating RN: [birth year] (73 y.o. Helene Shoe, Meta.Reding Primary Care Millisa Giarrusso: [name] Other Clinician: Referring Dominiqua Cooner: Treating Jemarcus Dougal/Extender:Stone III, Lemar Livings, Lendon Ka Weeks in Treatment: 1 Vital Signs Time Taken: 10:46 Temperature (F): 97.9 Height (in): 65 Pulse (bpm): 91 Weight (lbs): 162 Respiratory Rate (breaths/min): 20 Body Mass Index (BMI): 27 Blood Pressure (mmHg): 162/61 Capillary Blood Glucose (mg/dl): 99 Reference Range: 80 - 120 mg / dl Electronic Signature(s) Signed: 03/02/2019 3:16:44 PM By: Deon Pilling Entered By: Deon Pilling on 03/02/2019 10:47:07

## 2019-03-02 NOTE — Progress Notes (Signed)
KENNADI, ALBANY ([id number]) Visit Report for 03/02/2019 Chief Complaint Document Details Patient Name: Date of Service: [name], [name] 03/02/2019 9:45 AM Medical Record [id number] Patient Account Number: [id number] Date of Birth/Sex: Treating RN: [birth year] (73 y.o. F) Primary Care Provider: [name] Other Clinician: Referring Provider: Treating Provider/Extender:Stone III, Lemar Livings, Lendon Ka Weeks in Treatment: 1 Information Obtained from: Patient Chief Complaint Right foot and bilateral LE ulcers Electronic Signature(s) Signed: 03/02/2019 4:52:33 PM By: Worthy Keeler PA-C Entered By: Worthy Keeler on 03/02/2019 09:57:33 -------------------------------------------------------------------------------- Debridement Details Patient Name: Date of Service: [name] 03/02/2019 9:45 AM Medical Record [id number] Patient Account Number: [id number] Date of Birth/Sex: Treating RN: [birth year] (73 y.o. [name] Primary Care Provider: [name] Other Clinician: Referring Provider: Treating Provider/Extender:Stone III, Lemar Livings, Lendon Ka Weeks in Treatment: 1 Debridement Performed for Wound #3 Right,Anterior Lower Leg Assessment: Performed By: Clinician Deon Pilling, RN Debridement Type: Chemical/Enzymatic/Mechanical Agent Used: Santyl Severity of Tissue Pre Fat layer exposed Debridement: Level of Consciousness (Pre- Awake and Alert procedure): Pre-procedure Verification/Time Out Taken: Yes - 11:16 Bleeding: None Response to Treatment: Procedure was tolerated well Level of Consciousness Awake and Alert (Post-procedure): Post Debridement Measurements of Total Wound Length: (cm) 1 Width: (cm) 1.8 Depth: (cm) 0.1 Volume: (cm) 0.141 Character of Wound/Ulcer Post Requires Further Debridement Debridement: Severity of Tissue Post Debridement: Fat layer exposed Post Procedure Diagnosis Same as  Pre-procedure Electronic Signature(s) Signed: 03/02/2019 3:14:28 PM By: Baruch Gouty RN, BSN Signed: 03/02/2019 4:52:33 PM By: Worthy Keeler PA-C Entered By: Baruch Gouty on 03/02/2019 11:17:32 -------------------------------------------------------------------------------- Debridement Details Patient Name: Date of Service: [name] 03/02/2019 9:45 AM Medical Record [id number] Patient Account Number: [id number] Date of Birth/Sex: Treating RN: 31-Dec-1945 (73 y.o. [name] Primary Care Provider: [name] Other Clinician: Referring Provider: Treating Provider/Extender:Stone III, Lemar Livings, Lendon Ka Weeks in Treatment: 1 Debridement Performed for Wound #4 Right,Medial Lower Leg Assessment: Performed By: Clinician Deon Pilling, RN Debridement Type: Chemical/Enzymatic/Mechanical Agent Used: Santyl Severity of Tissue Pre Fat layer exposed Debridement: Level of Consciousness (Pre- Awake and Alert procedure): Pre-procedure Verification/Time Out Taken: Yes - 11:16 Bleeding: None Response to Treatment: Procedure was tolerated well Level of Consciousness Awake and Alert (Post-procedure): Post Debridement Measurements of Total Wound Length: (cm) 4.3 Width: (cm) 4.5 Depth: (cm) 0.1 Volume: (cm) 1.52 Character of Wound/Ulcer Post Requires Further Debridement Debridement: Severity of Tissue Post Debridement: Fat layer exposed Post Procedure Diagnosis Same as Pre-procedure Electronic Signature(s) Signed: 03/02/2019 3:14:28 PM By: Baruch Gouty RN, BSN Signed: 03/02/2019 4:52:33 PM By: Worthy Keeler PA-C Entered By: Baruch Gouty on 03/02/2019 11:18:08 -------------------------------------------------------------------------------- Debridement Details Patient Name: Date of Service: [name] 03/02/2019 9:45 AM Medical Record [id number] Patient Account Number: [id number] Date of Birth/Sex: Treating RN: 10-29-1945  (73 y.o. [name] Primary Care Provider: [name] Other Clinician: Referring Provider: Treating Provider/Extender:Stone III, Lemar Livings, Lendon Ka Weeks in Treatment: 1 Debridement Performed for Wound #5 Left,Medial Lower Leg Assessment: Performed By: Clinician Deon Pilling, RN Debridement Type: Chemical/Enzymatic/Mechanical Agent Used: Santyl Severity of Tissue Pre Fat layer exposed Debridement: Level of Consciousness (Pre- Awake and Alert procedure): Pre-procedure Verification/Time Out Taken: Yes - 11:16 Bleeding: None Response to Treatment: Procedure was tolerated well Level of Consciousness Awake and Alert (Post-procedure): Post Debridement Measurements of Total Wound Length: (cm) 2.3 Width: (cm) 2 Depth: (cm) 0.1 Volume: (cm) 0.361 Character of Wound/Ulcer Post Requires Further Debridement Debridement: Severity of Tissue Post Debridement: Fat layer exposed  Post Procedure Diagnosis Same as Pre-procedure Electronic Signature(s) Signed: 03/02/2019 3:14:28 PM By: Baruch Gouty RN, BSN Signed: 03/02/2019 4:52:33 PM By: Worthy Keeler PA-C Entered By: Baruch Gouty on 03/02/2019 11:18:32 -------------------------------------------------------------------------------- Debridement Details Patient Name: Date of Service: [name] 03/02/2019 9:45 AM Medical Record [id number] Patient Account Number: [id number] Date of Birth/Sex: Treating RN: Apr 12, 1945 (73 y.o. [name] Primary Care Provider: [name] Other Clinician: Referring Provider: Treating Provider/Extender:Stone III, Lemar Livings, Lendon Ka Weeks in Treatment: 1 Debridement Performed for Wound #6 Left,Proximal,Medial Lower Leg Assessment: Performed By: Clinician Deon Pilling, RN Debridement Type: Chemical/Enzymatic/Mechanical Agent Used: Santyl Severity of Tissue Pre Fat layer exposed Debridement: Level of Consciousness (Pre- Awake and  Alert procedure): Pre-procedure Yes - 11:16 Verification/Time Out Taken: Bleeding: None Response to Treatment: Procedure was tolerated well Level of Consciousness Awake and Alert (Post-procedure): Post Debridement Measurements of Total Wound Length: (cm) 2.5 Width: (cm) 1.7 Depth: (cm) 0.1 Volume: (cm) 0.334 Character of Wound/Ulcer Post Requires Further Debridement Debridement: Severity of Tissue Post Debridement: Fat layer exposed Post Procedure Diagnosis Same as Pre-procedure Electronic Signature(s) Signed: 03/02/2019 3:14:28 PM By: Baruch Gouty RN, BSN Signed: 03/02/2019 4:52:33 PM By: Worthy Keeler PA-C Entered By: Baruch Gouty on 03/02/2019 11:19:07 -------------------------------------------------------------------------------- HPI Details Patient Name: Date of Service: [name] 03/02/2019 9:45 AM Medical Record [id number] Patient Account Number: [id number] Date of Birth/Sex: Treating RN: [birth year] (73 y.o. F) Primary Care Provider: [name] Other Clinician: Referring Provider: Treating Provider/Extender:Stone III, Lemar Livings, Lendon Ka Weeks in Treatment: 1 History of Present Illness HPI Description: 02/23/2019 on evaluation today patient presents for initial evaluation in our clinic concerning issues that she has been having with her bilateral lower extremities as well as her plantar foot on the right. This has been quite some time in regard to the foot ulcer even back as far as the beginning of this year that is 2020. She has had the wounds on her legs a much shorter time but that still has been roughly 2-3 months. Nonetheless she is really not shown signs of a lot of improvement. She tells me that she does have a past medical history of diabetes, venous insufficiency, lymphedema which is mild but nonetheless present, peripheral vascular disease and she has previously been recommended to have a femoral-popliteal bypass but put  that off at that point they are basically just monitoring at this time. She has end-stage renal disease with dependence on renal dialysis, hypertension, and obviously wounds of the bilateral lower extremities at this point. She tells me that she is having some discomfort but fortunately nothing too significant at this point which is good news. No fevers, chills, nausea, vomiting, or diarrhea. She is mainly been leaving these areas open to air as much as possible at this point at 1 point she was trying to keep them covered more but that really did not help either. Fortunately there is no signs of active systemic nor local infection at this time. 03/02/2019 on evaluation today patient appears to be doing well with regard to her wounds. The leg ulcers which were very dry are much more moist at this point and seem to be doing great at this time. Overall I am very pleased with how things seem to be progressing. With regard to her foot ulcer it is showing some signs of slight improvement in my opinion overall there does not appear to be anything worse at least which is also good news. No fevers, chills, nausea, vomiting,  or diarrhea. Electronic Signature(s) Signed: 03/02/2019 4:52:33 PM By: Worthy Keeler PA-C Entered By: Worthy Keeler on 03/02/2019 11:20:38 -------------------------------------------------------------------------------- Physical Exam Details Patient Name: Date of Service: [name], [name] 03/02/2019 9:45 AM Medical Record [id number] Patient Account Number: [id number] Date of Birth/Sex: Treating RN: [birth year] (73 y.o. F) Primary Care Provider: [name] Other Clinician: Referring Provider: Treating Provider/Extender:Stone III, Lemar Livings, Lendon Ka Weeks in Treatment: 1 Constitutional Well-nourished and well-hydrated in no acute distress. Respiratory normal breathing without difficulty. clear to auscultation bilaterally. Cardiovascular regular rate and  rhythm with normal S1, S2. Psychiatric this patient is able to make decisions and demonstrates good insight into disease process. Alert and Oriented x 3. pleasant and cooperative. Notes Upon inspection patient's wound bed showed signs of good granulation at this point. Fortunately there does not appear to be any evidence of active infection which is also good news. Overall I feel like she is showing definite improvement compared to last week which is excellent news. Electronic Signature(s) Signed: 03/02/2019 4:52:33 PM By: Worthy Keeler PA-C Entered By: Worthy Keeler on 03/02/2019 11:21:18 -------------------------------------------------------------------------------- Physician Orders Details Patient Name: Date of Service: [name], [name] 03/02/2019 9:45 AM Medical Record [id number] Patient Account Number: [id number] Date of Birth/Sex: Treating RN: 13-Oct-1945 (73 y.o. [name] Primary Care Provider: [name] Other Clinician: Referring Provider: Treating Provider/Extender:Stone III, Lemar Livings, Lendon Ka Weeks in Treatment: 1 Verbal / Phone Orders: No Diagnosis Coding ICD-10 Coding Code Description E11.621 Type 2 diabetes mellitus with foot ulcer L97.512 Non-pressure chronic ulcer of 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d I87.2 Venous insufficiency (chronic) (peripheral) I89.0 Lymphedema, not elsewhere classified L97.812 Non-pressure chronic ulcer of other part of right lower leg with fat layer exposed L97.822 Non-pressure chronic ulcer of other part of left lower leg with fat layer exposed I73.89 Other specified peripheral vascular diseases N18.6 End stage renal disease Z99.2 Dependence on renal dialysis I10 Essential (primary) hypertension Follow-up Appointments Return Appointment in 1 week. Dressing Change Frequency Wound #2 Right,Plantar Foot Do not change entire dressing for one week. Wound #3 Right,Anterior Lower Leg Do not  change entire dressing for one week. Wound #4 Right,Medial Lower Leg Do not change entire dressing for one week. Wound #5 Left,Medial Lower Leg Do not change entire dressing for one week. Wound #6 Left,Proximal,Medial Lower Leg Do not change entire dressing for one week. Skin Barriers/Peri-Wound Care Moisturizing lotion - both legs Wound Cleansing May shower with protection. Primary Wound Dressing Wound #2 Right,Plantar Foot Silver Collagen Wound #3 Right,Anterior Lower Leg Santyl Ointment Hydrogel - thin layer over santyl covered with adaptic Wound #4 Right,Medial Lower Leg Santyl Ointment Hydrogel - thin layer over santyl covered with adaptic Wound #5 Left,Medial Lower Leg Santyl Ointment Hydrogel - thin layer over santyl covered with adaptic Wound #6 Left,Proximal,Medial Lower Leg Santyl Ointment Hydrogel - thin layer over santyl covered with adaptic Secondary Dressing Wound #2 Right,Plantar Foot Foam - foam donut Kerlix/Rolled Gauze Dry Gauze Drawtex - cut to fit inside wound edges to hold collagen in place Wound #3 Right,Anterior Lower Leg Dry Gauze ABD pad Wound #4 Right,Medial Lower Leg Dry Gauze ABD pad Wound #5 Left,Medial Lower Leg Dry Gauze ABD pad Wound #6 Left,Proximal,Medial Lower Leg Dry Gauze ABD pad Edema Control Kerlix and Coban - Bilateral Avoid standing for long periods of time Elevate legs to the level of the heart or above for 30 minutes daily and/or when sitting, a frequency of: - whenever sitting Exercise  regularly Electronic Signature(s) Signed: 03/02/2019 3:14:28 PM By: Baruch Gouty RN, BSN Signed: 03/02/2019 4:52:33 PM By: Worthy Keeler PA-C Entered By: Baruch Gouty on 03/02/2019 11:20:05 -------------------------------------------------------------------------------- Problem List Details Patient Name: Date of Service: [name] 03/02/2019 9:45 AM Medical Record [id number] Patient Account Number:  [id number] Date of Birth/Sex: Treating RN: 03/30/1946 (73 y.o. F) Primary Care Provider: [name] Other Clinician: Referring Provider: Treating Provider/Extender:Stone III, Lemar Livings, Lendon Ka Weeks in Treatment: 1 Active Problems ICD-10 Evaluated Encounter Code Description Active Date Today Diagnosis E11.621 Type 2 diabetes mellitus with foot ulcer 02/23/2019 No Yes L97.512 Non-pressure chronic ulcer of other part of right foot 02/23/2019 No Yes with fat layer exposed I87.2 Venous insufficiency (chronic) (peripheral) 02/23/2019 No Yes I89.0 Lymphedema, not elsewhere classified 02/23/2019 No Yes L97.812 Non-pressure chronic ulcer of other part of right lower 02/23/2019 No Yes leg with fat layer exposed L97.822 Non-pressure chronic ulcer of other part of left lower 02/23/2019 No Yes leg with fat layer exposed I73.89 Other specified peripheral vascular diseases 02/23/2019 No Yes N18.6 End stage renal disease 02/23/2019 No Yes Z99.2 Dependence on renal dialysis 02/23/2019 No Yes I10 Essential (primary) hypertension 02/23/2019 No Yes Inactive Problems Resolved Problems Electronic Signature(s) Signed: 03/02/2019 4:52:33 PM By: Worthy Keeler PA-C Entered By: Worthy Keeler on 03/02/2019 09:57:28 -------------------------------------------------------------------------------- Progress Note Details Patient Name: Date of Service: [name] 03/02/2019 9:45 AM Medical Record [id number] Patient Account Number: [id number] Date of Birth/Sex: Treating RN: 03/09/1946 (73 y.o. F) Primary Care Provider: [name] Other Clinician: Referring Provider: Treating Provider/Extender:Stone III, Lemar Livings, Lendon Ka Weeks in Treatment: 1 Subjective Chief Complaint Information obtained from Patient Right foot and bilateral LE ulcers History of Present Illness (HPI) 02/23/2019 on evaluation today patient presents for initial evaluation in our clinic  concerning issues that she has been having with her bilateral lower extremities as well as her plantar foot on the right. This has been quite some time in regard to the foot ulcer even back as far as the beginning of this year that is 2020. She has had the wounds on her legs a much shorter time but that still has been roughly 2-3 months. Nonetheless she is really not shown signs of a lot of improvement. She tells me that she does have a past medical history of diabetes, venous insufficiency, lymphedema which is mild but nonetheless present, peripheral vascular disease and she has previously been recommended to have a femoral-popliteal bypass but put that off at that point they are basically just monitoring at this time. She has end-stage renal disease with dependence on renal dialysis, hypertension, and obviously wounds of the bilateral lower extremities at this point. She tells me that she is having some discomfort but fortunately nothing too significant at this point which is good news. No fevers, chills, nausea, vomiting, or diarrhea. She is mainly been leaving these areas open to air as much as possible at this point at 1 point she was trying to keep them covered more but that really did not help either. Fortunately there is no signs of active systemic nor local infection at this time. 03/02/2019 on evaluation today patient appears to be doing well with regard to her wounds. The leg ulcers which were very dry are much more moist at this point and seem to be doing great at this time. Overall I am very pleased with how things seem to be progressing. With regard to her foot ulcer it is showing some signs of  slight improvement in my opinion overall there does not appear to be anything worse at least which is also good news. No fevers, chills, nausea, vomiting, or diarrhea. Patient History Information obtained from Patient. Family History Cancer - Father,Siblings, Diabetes - Maternal  Grandparents,Paternal Grandparents,Mother,Father,Siblings, Heart Disease - Maternal Grandparents,Paternal Grandparents,Mother,Father,Siblings, Hypertension - Maternal Grandparents,Paternal Grandparents, Kidney Disease - Siblings, No family history of Hereditary Spherocytosis, Lung Disease, Seizures, Stroke, Thyroid Problems, Tuberculosis. Social History Never smoker, Marital Status - Married, Alcohol Use - Never, Drug Use - No History, Caffeine Use - Rarely. Medical History Eyes Patient has history of Cataracts - removed Cardiovascular Patient has history of Congestive Heart Failure, Coronary Artery Disease, Hypertension, Peripheral Arterial Disease, Peripheral Venous Disease Endocrine Patient has history of Type II Diabetes Genitourinary Patient has history of End Stage Renal Disease - Hemodialysis 3x/week Integumentary (Skin) Denies history of History of Burn Musculoskeletal Patient has history of Osteoarthritis Neurologic Patient has history of Neuropathy Oncologic Patient has history of Received Radiation Medical And Surgical History Notes Cardiovascular Hyperlipidemia, Cardiomyopathy Oncologic Breast cancer 2014 with lumpectomy Review of Systems (ROS) Constitutional Symptoms (General Health) Denies complaints or symptoms of Fatigue, Fever, Chills, Marked Weight Change. Respiratory Denies complaints or symptoms of Chronic or frequent coughs, Shortness of Breath. Cardiovascular Denies complaints or symptoms of Chest pain. Psychiatric Denies complaints or symptoms of Claustrophobia, Suicidal. Objective Constitutional Well-nourished and well-hydrated in no acute distress. Vitals Time Taken: 10:46 AM, Height: 65 in, Weight: 162 lbs, BMI: 27, Temperature: 97.9 F, Pulse: 91 bpm, Respiratory Rate: 20 breaths/min, Blood Pressure: 162/61 mmHg, Capillary Blood Glucose: 99 mg/dl. Respiratory normal breathing without difficulty. clear to auscultation  bilaterally. Cardiovascular regular rate and rhythm with normal S1, S2. Psychiatric this patient is able to make decisions and demonstrates good insight into disease process. Alert and Oriented x 3. pleasant and cooperative. General Notes: Upon inspection patient's wound bed showed signs of good granulation at this point. Fortunately there does not appear to be any evidence of active infection which is also good news. Overall I feel like she is showing definite improvement compared to last week which is excellent news. Integumentary (Hair, Skin) Wound #2 status is Open. Original cause of wound was Gradually Appeared. The wound is located on the [place]. The wound measures 0.4cm length x 0.9cm width x 0.4cm depth; 0.283cm^2 area and 0.113cm^3 volume. There is Fat Layer (Subcutaneous Tissue) Exposed exposed. There is no tunneling or undermining noted. There is a medium amount of serosanguineous drainage noted. The wound margin is thickened. There is large (67-100%) red granulation within the wound bed. There is no necrotic tissue within the wound bed. Wound #3 status is Open. Original cause of wound was Blister. The wound is located on the Right,Anterior Lower Leg. The wound measures 1cm length x 1.8cm width x 0.1cm depth; 1.414cm^2 area and 0.141cm^3 volume. There is Fat Layer (Subcutaneous Tissue) Exposed exposed. There is no tunneling or undermining noted. There is a medium amount of serosanguineous drainage noted. The wound margin is flat and intact. There is large (67-100%) pink granulation within the wound bed. There is no necrotic tissue within the wound bed. Wound #4 status is Open. Original cause of wound was Blister. The wound is located on the Right,Medial Lower Leg. The wound measures 4.3cm length x 4.5cm width x 0.1cm depth; 15.197cm^2 area and 1.52cm^3 volume. There is Fat Layer (Subcutaneous Tissue) Exposed exposed. There is no tunneling or undermining noted. There is  a medium amount of serosanguineous drainage noted. The wound margin  is flat and intact. There is small (1-33%) pink granulation within the wound bed. There is a large (67-100%) amount of necrotic tissue within the wound bed including Adherent Slough. Wound #5 status is Open. Original cause of wound was Blister. The wound is located on the Left,Medial Lower Leg. The wound measures 2.3cm length x 2cm width x 0.1cm depth; 3.613cm^2 area and 0.361cm^3 volume. There is Fat Layer (Subcutaneous Tissue) Exposed exposed. There is no tunneling or undermining noted. There is a medium amount of serosanguineous drainage noted. The wound margin is flat and intact. There is small (1-33%) pink granulation within the wound bed. There is a large (67-100%) amount of necrotic tissue within the wound bed including Adherent Slough. Wound #6 status is Open. Original cause of wound was Blister. The wound is located on the Left,Proximal,Medial Lower Leg. The wound measures 2.5cm length x 1.7cm width x 0.1cm depth; 3.338cm^2 area and 0.334cm^3 volume. There is Fat Layer (Subcutaneous Tissue) Exposed exposed. There is no tunneling or undermining noted. There is a medium amount of serosanguineous drainage noted. The wound margin is flat and intact. There is no granulation within the wound bed. There is a large (67-100%) amount of necrotic tissue within the wound bed including Adherent Slough. Assessment Active Problems ICD-10 Type 2 diabetes mellitus with foot ulcer Non-pressure chronic ulcer of 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d Venous insufficiency (chronic) (peripheral) Lymphedema, not elsewhere classified Non-pressure chronic ulcer of other part of right lower leg with fat layer exposed Non-pressure chronic ulcer of other part of left lower leg with fat layer exposed Other specified peripheral vascular diseases End stage renal disease Dependence on renal dialysis Essential (primary)  hypertension Procedures Wound #3 Pre-procedure diagnosis of Wound #3 is a Venous Leg Ulcer located on the Right,Anterior Lower Leg .Severity of Tissue Pre Debridement is: Fat layer exposed. There was a Chemical/Enzymatic/Mechanical debridement performed by Deon Pilling, RN.Marland Kitchen Agent used was [employer]. A time out was conducted at 11:16, prior to the start of the procedure. There was no bleeding. The procedure was tolerated well. Post Debridement Measurements: 1cm length x 1.8cm width x 0.1cm depth; 0.141cm^3 volume. Character of Wound/Ulcer Post Debridement requires further debridement. Severity of Tissue Post Debridement is: Fat layer exposed. Post procedure Diagnosis Wound #3: Same as Pre-Procedure Wound #4 Pre-procedure diagnosis of Wound #4 is a Venous Leg Ulcer located on the Right,Medial Lower Leg .Severity of Tissue Pre Debridement is: Fat layer exposed. There was a Chemical/Enzymatic/Mechanical debridement performed by Deon Pilling, RN.Marland Kitchen Agent used was [employer]. A time out was conducted at 11:16, prior to the start of the procedure. There was no bleeding. The procedure was tolerated well. Post Debridement Measurements: 4.3cm length x 4.5cm width x 0.1cm depth; 1.52cm^3 volume. Character of Wound/Ulcer Post Debridement requires further debridement. Severity of Tissue Post Debridement is: Fat layer exposed. Post procedure Diagnosis Wound #4: Same as Pre-Procedure Wound #5 Pre-procedure diagnosis of Wound #5 is a Venous Leg Ulcer located on the Left,Medial Lower Leg .Severity of Tissue Pre Debridement is: Fat layer exposed. There was a Chemical/Enzymatic/Mechanical debridement performed by Deon Pilling, RN.Marland Kitchen Agent used was [employer]. A time out was conducted at 11:16, prior to the start of the procedure. There was no bleeding. The procedure was tolerated well. Post Debridement Measurements: 2.3cm length x 2cm width x 0.1cm depth; 0.361cm^3 volume. Character of Wound/Ulcer Post Debridement  requires further debridement. Severity of Tissue Post Debridement is: Fat layer exposed. Post procedure Diagnosis Wound #5: Same as Pre-Procedure Wound #  6 Pre-procedure diagnosis of Wound #6 is a Venous Leg Ulcer located on the Left,Proximal,Medial Lower Leg .Severity of Tissue Pre Debridement is: Fat layer exposed. There was a Chemical/Enzymatic/Mechanical debridement performed by Deon Pilling, RN.Marland Kitchen Agent used was [employer]. A time out was conducted at 11:16, prior to the start of the procedure. There was no bleeding. The procedure was tolerated well. Post Debridement Measurements: 2.5cm length x 1.7cm width x 0.1cm depth; 0.334cm^3 volume. Character of Wound/Ulcer Post Debridement requires further debridement. Severity of Tissue Post Debridement is: Fat layer exposed. Post procedure Diagnosis Wound #6: Same as Pre-Procedure Plan Follow-up Appointments: Return Appointment in 1 week. Dressing Change Frequency: Wound #2 Right,Plantar Foot: Do not change entire dressing for one week. Wound #3 Right,Anterior Lower Leg: Do not change entire dressing for one week. Wound #4 Right,Medial Lower Leg: Do not change entire dressing for one week. Wound #5 Left,Medial Lower Leg: Do not change entire dressing for one week. Wound #6 Left,Proximal,Medial Lower Leg: Do not change entire dressing for one week. Skin Barriers/Peri-Wound Care: Moisturizing lotion - both legs Wound Cleansing: May shower with protection. Primary Wound Dressing: Wound #2 Right,Plantar Foot: Silver Collagen Wound #3 Right,Anterior Lower Leg: Santyl Ointment Hydrogel - thin layer over santyl covered with adaptic Wound #4 Right,Medial Lower Leg: Santyl Ointment Hydrogel - thin layer over santyl covered with adaptic Wound #5 Left,Medial Lower Leg: Santyl Ointment Hydrogel - thin layer over santyl covered with adaptic Wound #6 Left,Proximal,Medial Lower Leg: Santyl Ointment Hydrogel - thin layer over santyl covered with  adaptic Secondary Dressing: Wound #2 Right,Plantar Foot: Foam - foam donut Kerlix/Rolled Gauze Dry Gauze Drawtex - cut to fit inside wound edges to hold collagen in place Wound #3 Right,Anterior Lower Leg: Dry Gauze ABD pad Wound #4 Right,Medial Lower Leg: Dry Gauze ABD pad Wound #5 Left,Medial Lower Leg: Dry Gauze ABD pad Wound #6 Left,Proximal,Medial Lower Leg: Dry Gauze ABD pad Edema Control: Kerlix and Coban - Bilateral Avoid standing for long periods of time Elevate legs to the level of the heart or above for 30 minutes daily and/or when sitting, a frequency of: - whenever sitting Exercise regularly 1. Her wound bed currently showed signs of good granulation starting to poke through on the legs she did have some slough noted I was able to gently remove some of this although some is still stuck somewhat to the wound bed. Overall however I feel like she is making good progress based on what I am seeing. With that being said I am getting suggest that we continue with the Santyl covered by hydrogel and then Adaptic to keep things moist. 2. I would recommend as well that we continue with the collagen for the plantar foot wound as that seems to be helping in my opinion at this point. 3. I would recommend as well that we go ahead and continue with the light Kerlix and Coban wraps bilaterally that seems to have done well we do not need to take the top of the wraps. We will see patient back for reevaluation in 1 week here in the clinic. If anything worsens or changes patient will contact our office for additional recommendations. Electronic Signature(s) Signed: 03/02/2019 4:52:33 PM By: Worthy Keeler PA-C Entered By: Worthy Keeler on 03/02/2019 11:22:35 -------------------------------------------------------------------------------- HxROS Details Patient Name: Date of Service: [name] 03/02/2019 9:45 AM Medical Record [id number] Patient Account Number:  [id number] Date of Birth/Sex: Treating RN: Nov 03, 1945 (73 y.o. F) Primary Care Provider: [name] Other Clinician: Referring  Provider: Treating Provider/Extender:Stone III, Lemar Livings, Lendon Ka Weeks in Treatment: 1 Information Obtained From Patient Constitutional Symptoms (General Health) Complaints and Symptoms: Negative for: Fatigue; Fever; Chills; Marked Weight Change Respiratory Complaints and Symptoms: Negative for: Chronic or frequent coughs; Shortness of Breath Cardiovascular Complaints and Symptoms: Negative for: Chest pain Medical History: Positive for: Congestive Heart Failure; Coronary Artery Disease; Hypertension; Peripheral Arterial Disease; Peripheral Venous Disease Past Medical History Notes: Hyperlipidemia, Cardiomyopathy Psychiatric Complaints and Symptoms: Negative for: Claustrophobia; Suicidal Eyes Medical History: Positive for: Cataracts - removed Endocrine Medical History: Positive for: Type II Diabetes Time with diabetes: since 2000 Treated with: Insulin Blood sugar tested every day: Yes Tested : 1-2x a day Genitourinary Medical History: Positive for: End Stage Renal Disease - Hemodialysis 3x/week Integumentary (Skin) Medical History: Negative for: History of Burn Musculoskeletal Medical History: Positive for: Osteoarthritis Neurologic Medical History: Positive for: Neuropathy Oncologic Medical History: Positive for: Received Radiation Past Medical History Notes: Breast cancer 2014 with lumpectomy HBO Extended History Items Eyes: Cataracts Immunizations Pneumococcal Vaccine: Received Pneumococcal Vaccination: Yes Tetanus Vaccine: Last tetanus shot: 01/13/2012 Implantable Devices None Family and Social History Cancer: Yes - Father,Siblings; Diabetes: Yes - Maternal Grandparents,Paternal Grandparents,Mother,Father,Siblings; Heart Disease: Yes - Maternal Grandparents,Paternal Grandparents,Mother,Father,Siblings;  Hereditary Spherocytosis: No; Hypertension: Yes - Maternal Grandparents,Paternal Grandparents; Kidney Disease: Yes - Siblings; Lung Disease: No; Seizures: No; Stroke: No; Thyroid Problems: No; Tuberculosis: No; Never smoker; Marital Status - Married; Alcohol Use: Never; Drug Use: No History; Caffeine Use: Rarely; Financial Concerns: No; Food, Clothing or Shelter Needs: No; Support System Lacking: No; Transportation Concerns: No Physician Affirmation I have reviewed and agree with the above information. Electronic Signature(s) Signed: 03/02/2019 4:52:33 PM By: Worthy Keeler PA-C Entered By: Worthy Keeler on 03/02/2019 11:20:52 -------------------------------------------------------------------------------- SuperBill Details Patient Name: Date of Service: [name] 03/02/2019 Medical Record [id number] Patient Account Number: [id number] Date of Birth/Sex: Treating RN: 02-Jul-1945 (73 y.o. [name] Primary Care Provider: [name] Other Clinician: Referring Provider: Treating Provider/Extender:Stone III, Lemar Livings, Lendon Ka Weeks in Treatment: 1 Diagnosis Coding ICD-10 Codes Code Description E11.621 Type 2 diabetes mellitus with foot ulcer L97.512 Non-pressure chronic ulcer of 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d I87.2 Venous insufficiency (chronic) (peripheral) I89.0 Lymphedema, not elsewhere classified L97.812 Non-pressure chronic ulcer of other part of right lower leg with fat layer exposed L97.822 Non-pressure chronic ulcer of other part of left lower leg with fat layer exposed I73.89 Other specified peripheral vascular diseases N18.6 End stage renal disease Z99.2 Dependence on renal dialysis I10 Essential (primary) hypertension Facility Procedures CPT4 Code: [id number] Description: 67124 - DEBRIDE W/O ANES NON SELECT Modifier: Quantity: 1 Physician Procedures CPT4 Code Description: [id number] - WC PHYS LEVEL 4 - EST PT  ICD-10 Diagnosis Description E11.621 Type 2 diabetes mellitus with foot ulcer L97.512 Non-pressure chronic ulcer of other part of right foot I87.2 Venous insufficiency (chronic) (peripheral)  I89.0 Lymphedema, not elsewhere classified Modifier: with fat layer e Quantity: 1 xposed Electronic Signature(s) Signed: 03/02/2019 4:52:33 PM By: Worthy Keeler PA-C Entered By: Worthy Keeler on 03/02/2019 11:22:49

## 2019-03-05 DIAGNOSIS — Z23 Encounter for immunization: Secondary | ICD-10-CM | POA: Diagnosis not present

## 2019-03-05 DIAGNOSIS — D509 Iron deficiency anemia, unspecified: Secondary | ICD-10-CM | POA: Diagnosis not present

## 2019-03-05 DIAGNOSIS — N186 End stage renal disease: Secondary | ICD-10-CM | POA: Diagnosis not present

## 2019-03-05 DIAGNOSIS — N2581 Secondary hyperparathyroidism of renal origin: Secondary | ICD-10-CM | POA: Diagnosis not present

## 2019-03-05 DIAGNOSIS — L03119 Cellulitis of unspecified part of limb: Secondary | ICD-10-CM | POA: Diagnosis not present

## 2019-03-05 DIAGNOSIS — Z992 Dependence on renal dialysis: Secondary | ICD-10-CM | POA: Diagnosis not present

## 2019-03-09 ENCOUNTER — Encounter (HOSPITAL_BASED_OUTPATIENT_CLINIC_OR_DEPARTMENT_OTHER): Payer: Medicare Other | Attending: Physician Assistant | Admitting: Physician Assistant

## 2019-03-09 ENCOUNTER — Other Ambulatory Visit: Payer: Self-pay

## 2019-03-09 DIAGNOSIS — L97822 Non-pressure chronic ulcer of other part of left lower leg with fat layer exposed: Secondary | ICD-10-CM | POA: Diagnosis not present

## 2019-03-09 DIAGNOSIS — I890 Lymphedema, not elsewhere classified: Secondary | ICD-10-CM | POA: Insufficient documentation

## 2019-03-09 DIAGNOSIS — E785 Hyperlipidemia, unspecified: Secondary | ICD-10-CM | POA: Diagnosis not present

## 2019-03-09 DIAGNOSIS — Z8249 Family history of ischemic heart disease and other diseases of the circulatory system: Secondary | ICD-10-CM | POA: Diagnosis not present

## 2019-03-09 DIAGNOSIS — I429 Cardiomyopathy, unspecified: Secondary | ICD-10-CM | POA: Diagnosis not present

## 2019-03-09 DIAGNOSIS — Z888 Allergy status to other drugs, medicaments and biological substances status: Secondary | ICD-10-CM | POA: Insufficient documentation

## 2019-03-09 DIAGNOSIS — M9901 Segmental and somatic dysfunction of cervical region: Secondary | ICD-10-CM | POA: Diagnosis not present

## 2019-03-09 DIAGNOSIS — I872 Venous insufficiency (chronic) (peripheral): Secondary | ICD-10-CM | POA: Insufficient documentation

## 2019-03-09 DIAGNOSIS — L97812 Non-pressure chronic ulcer of other part of right lower leg with fat layer exposed: Secondary | ICD-10-CM | POA: Insufficient documentation

## 2019-03-09 DIAGNOSIS — E1136 Type 2 diabetes mellitus with diabetic cataract: Secondary | ICD-10-CM | POA: Insufficient documentation

## 2019-03-09 DIAGNOSIS — Z809 Family history of malignant neoplasm, unspecified: Secondary | ICD-10-CM | POA: Diagnosis not present

## 2019-03-09 DIAGNOSIS — I132 Hypertensive heart and chronic kidney disease with heart failure and with stage 5 chronic kidney disease, or end stage renal disease: Secondary | ICD-10-CM | POA: Diagnosis not present

## 2019-03-09 DIAGNOSIS — E1140 Type 2 diabetes mellitus with diabetic neuropathy, unspecified: Secondary | ICD-10-CM | POA: Diagnosis not present

## 2019-03-09 DIAGNOSIS — E1122 Type 2 diabetes mellitus with diabetic chronic kidney disease: Secondary | ICD-10-CM | POA: Diagnosis not present

## 2019-03-09 DIAGNOSIS — Z992 Dependence on renal dialysis: Secondary | ICD-10-CM | POA: Diagnosis not present

## 2019-03-09 DIAGNOSIS — L97522 Non-pressure chronic ulcer of other part of left foot with fat layer exposed: Secondary | ICD-10-CM | POA: Diagnosis not present

## 2019-03-09 DIAGNOSIS — N186 End stage renal disease: Secondary | ICD-10-CM | POA: Diagnosis not present

## 2019-03-09 DIAGNOSIS — E11621 Type 2 diabetes mellitus with foot ulcer: Secondary | ICD-10-CM | POA: Diagnosis not present

## 2019-03-09 DIAGNOSIS — I509 Heart failure, unspecified: Secondary | ICD-10-CM | POA: Diagnosis not present

## 2019-03-09 DIAGNOSIS — L97526 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 of necrosis: Secondary | ICD-10-CM | POA: Insufficient documentation

## 2019-03-09 DIAGNOSIS — I2510 Atherosclerotic heart disease of native coronary artery without angina pectoris: Secondary | ICD-10-CM | POA: Insufficient documentation

## 2019-03-09 DIAGNOSIS — E1151 Type 2 diabetes mellitus with diabetic peripheral angiopathy without gangrene: Secondary | ICD-10-CM | POA: Diagnosis not present

## 2019-03-09 DIAGNOSIS — M1990 Unspecified osteoarthritis, unspecified site: Secondary | ICD-10-CM | POA: Insufficient documentation

## 2019-03-09 DIAGNOSIS — Z833 Family history of diabetes mellitus: Secondary | ICD-10-CM | POA: Diagnosis not present

## 2019-03-09 DIAGNOSIS — L97512 Non-pressure chronic ulcer of 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d: Secondary | ICD-10-CM | POA: Insufficient documentation

## 2019-03-09 DIAGNOSIS — M40292 Other kyphosis, cervical region: Secondary | ICD-10-CM | POA: Diagnosis not present

## 2019-03-09 DIAGNOSIS — M5136 Other intervertebral disc degeneration, lumbar region: Secondary | ICD-10-CM | POA: Diagnosis not present

## 2019-03-09 DIAGNOSIS — M9903 Segmental and somatic dysfunction of lumbar region: Secondary | ICD-10-CM | POA: Diagnosis not present

## 2019-03-10 ENCOUNTER — Other Ambulatory Visit: Payer: Self-pay

## 2019-03-10 ENCOUNTER — Other Ambulatory Visit (HOSPITAL_COMMUNITY): Payer: Self-pay | Admitting: Physician Assistant

## 2019-03-10 ENCOUNTER — Ambulatory Visit (HOSPITAL_COMMUNITY)
Admission: RE | Admit: 2019-03-10 | Discharge: 2019-03-10 | Disposition: A | Discharge status: Home / Self Care | Payer: Medicare Other | Source: Ambulatory Visit | Attending: Physician Assistant | Admitting: Physician Assistant

## 2019-03-10 DIAGNOSIS — L97526 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 of necrosis: Secondary | ICD-10-CM | POA: Diagnosis not present

## 2019-03-10 DIAGNOSIS — E08621 Diabetes mellitus due to underlying condition with foot ulcer: Secondary | ICD-10-CM | POA: Insufficient documentation

## 2019-03-10 DIAGNOSIS — E11621 Type 2 diabetes mellitus with foot ulcer: Secondary | ICD-10-CM | POA: Diagnosis not present

## 2019-03-10 DIAGNOSIS — L97512 Non-pressure chronic ulcer of 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d: Secondary | ICD-10-CM

## 2019-03-10 NOTE — Progress Notes (Signed)
[name] ([id number]) Visit Report for 03/09/2019 Chief Complaint Document Details Patient Name: Date of Service: [name] 03/09/2019 4:00 PM Medical Record [id number] Patient Account Number: [id number] Date of Birth/Sex: Treating RN: 11/06/1945 (73 y.o. [name] Primary Care Provider: [name] Other Clinician: Referring Provider: Treating Provider/Extender:Stone III, Lemar Livings, Lendon Ka Weeks in Treatment: 2 Information Obtained from: Patient Chief Complaint Bilateral foot and bilateral LE ulcers Electronic Signature(s) Signed: 03/10/2019 10:36:32 AM By: Worthy Keeler PA-C Entered By: Worthy Keeler on 03/09/2019 17:46:45 -------------------------------------------------------------------------------- Debridement Details Patient Name: Date of Service: [name] 03/09/2019 4:00 PM Medical Record [id number] Patient Account Number: [id number] Date of Birth/Sex: Treating RN: 1945-08-09 (73 y.o. [name] Primary Care Provider: [name] Other Clinician: Referring Provider: Treating Provider/Extender:Stone III, Lemar Livings, Lendon Ka Weeks in Treatment: 2 Debridement Performed for Wound #5 Left,Medial Lower Leg Assessment: Performed By: Physician Worthy Keeler, PA Debridement Type: Debridement Severity of Tissue Pre Fat layer exposed Debridement: Level of Consciousness (Pre- Awake and Alert procedure): Pre-procedure Verification/Time Out Taken: Yes - 17:20 Start Time: 17:22 Pain Control: Other : BENZOCAINE 20% spray Total Area Debrided (L x W): 2.5 (cm) x 2.8 (cm) = 7 (cm) Tissue and other material Viable, Non-Viable, Slough, Subcutaneous, Slough debrided: Level: Skin/Subcutaneous Tissue Debridement Description: Excisional Instrument: Curette Bleeding: Minimum Hemostasis Achieved: Pressure End Time: 17:28 Procedural Pain: 4 Post Procedural Pain: 2 Response to Treatment: Procedure was  tolerated well Level of Consciousness Awake and Alert (Post-procedure): Post Debridement Measurements of Total Wound Length: (cm) 2.5 Width: (cm) 2.8 Depth: (cm) 0.1 Volume: (cm) 0.55 Character of Wound/Ulcer Post Improved Debridement: Severity of Tissue Post Debridement: Fat layer exposed Post Procedure Diagnosis Same as Pre-procedure Electronic Signature(s) Signed: 03/09/2019 6:22:02 PM By: Baruch Gouty RN, BSN Signed: 03/10/2019 10:36:32 AM By: Worthy Keeler PA-C Entered By: Baruch Gouty on 03/09/2019 17:25:33 -------------------------------------------------------------------------------- Debridement Details Patient Name: Date of Service: [name] 03/09/2019 4:00 PM Medical Record [id number] Patient Account Number: [id number] Date of Birth/Sex: Treating RN: January 15, 1946 (73 y.o. [name], Vaughan Basta Primary Care Provider: [name] Other Clinician: Referring Provider: Treating Provider/Extender:Stone III, Lemar Livings, Lendon Ka Weeks in Treatment: 2 Debridement Performed for Wound #6 Left,Proximal,Medial Lower Leg Assessment: Performed By: Physician Worthy Keeler, PA Debridement Type: Debridement Severity of Tissue Pre Fat layer exposed Debridement: Level of Consciousness (Pre- Awake and Alert procedure): Pre-procedure Verification/Time Out Taken: Yes - 17:20 Start Time: 17:22 Pain Control: Other : BENZOCAINE 20% spray Total Area Debrided (L x W): 2.2 (cm) x 2 (cm) = 4.4 (cm) Tissue and other material Viable, Non-Viable, Slough, Subcutaneous, Slough Viable, Non-Viable, Slough, Subcutaneous, Slough debrided: Level: Skin/Subcutaneous Tissue Debridement Description: Excisional Instrument: Curette Bleeding: Minimum Hemostasis Achieved: Pressure End Time: 17:28 Procedural Pain: 4 Post Procedural Pain: 2 Response to Treatment: Procedure was tolerated well Level of Consciousness Awake and Alert (Post-procedure): Post Debridement  Measurements of Total Wound Length: (cm) 2.2 Width: (cm) 2.4 Depth: (cm) 0.1 Volume: (cm) 0.415 Character of Wound/Ulcer Post Improved Debridement: Severity of Tissue Post Debridement: Fat layer exposed Post Procedure Diagnosis Same as Pre-procedure Electronic Signature(s) Signed: 03/09/2019 6:22:02 PM By: Baruch Gouty RN, BSN Signed: 03/10/2019 10:36:32 AM By: Worthy Keeler PA-C Entered By: Baruch Gouty on 03/09/2019 17:26:26 -------------------------------------------------------------------------------- Debridement Details Patient Name: Date of Service: [name] 03/09/2019 4:00 PM Medical Record [id number] Patient Account Number: [id number] Date of Birth/Sex: Treating RN: 03-30-1946 (73 y.o. [name] Primary Care Provider: [name]  Other Clinician: Referring Provider: Treating Provider/Extender:Stone III, Lemar Livings, Lendon Ka Weeks in Treatment: 2 Debridement Performed for Wound #4 Right,Medial Lower Leg Assessment: Performed By: Physician Worthy Keeler, PA Debridement Type: Debridement Severity of Tissue Pre Fat layer exposed Debridement: Level of Consciousness (Pre- Awake and Alert procedure): Pre-procedure Verification/Time Out Taken: Yes - 17:20 Start Time: 17:22 Pain Control: Other : BENZOCAINE 20% spray Total Area Debrided (L x W): 4.2 (cm) x 4 (cm) = 16.8 (cm) Tissue and other material Tissue and other material Viable, Non-Viable, Slough, Subcutaneous, Slough debrided: Level: Skin/Subcutaneous Tissue Debridement Description: Excisional Instrument: Curette Bleeding: Minimum Hemostasis Achieved: Pressure End Time: 17:28 Procedural Pain: 5 Post Procedural Pain: 2 Response to Treatment: Procedure was tolerated well Level of Consciousness Awake and Alert (Post-procedure): Post Debridement Measurements of Total Wound Length: (cm) 4.2 Width: (cm) 4 Depth: (cm) 0.1 Volume: (cm) 1.319 Character of  Wound/Ulcer Post Improved Debridement: Severity of Tissue Post Debridement: Fat layer exposed Post Procedure Diagnosis Same as Pre-procedure Electronic Signature(s) Signed: 03/09/2019 6:22:02 PM By: Baruch Gouty RN, BSN Signed: 03/10/2019 10:36:32 AM By: Worthy Keeler PA-C Entered By: Baruch Gouty on 03/09/2019 17:27:57 -------------------------------------------------------------------------------- Debridement Details Patient Name: Date of Service: [name] 03/09/2019 4:00 PM Medical Record [id number] Patient Account Number: [id number] Date of Birth/Sex: Treating RN: 1945/09/15 (73 y.o. [name] Primary Care Provider: [name] Other Clinician: Referring Provider: Treating Provider/Extender:Stone III, Lemar Livings, Lendon Ka Weeks in Treatment: 2 Debridement Performed for Wound #2 Right,Plantar Foot Assessment: Performed By: Physician Worthy Keeler, PA Debridement Type: Debridement Severity of Tissue Pre Fat layer exposed Debridement: Level of Consciousness (Pre- Awake and Alert procedure): Pre-procedure Verification/Time Out Taken: Yes - 17:20 Start Time: 17:22 Pain Control: Other : BENZOCAINE 20% spray Total Area Debrided (L x W): 0.4 (cm) x 0.9 (cm) = 0.36 (cm) Tissue and other material Viable, Non-Viable, Callus, Slough, Subcutaneous, Slough debrided: Level: Skin/Subcutaneous Tissue Debridement Description: Excisional Instrument: Curette Bleeding: Minimum Hemostasis Achieved: Pressure End Time: 17:28 Procedural Pain: 2 Post Procedural Pain: 0 Response to Treatment: Procedure was tolerated well Level of Consciousness Awake and Alert (Post-procedure): Post Debridement Measurements of Total Wound Length: (cm) 0.4 Width: (cm) 0.9 Depth: (cm) 0.3 Volume: (cm) 0.085 Character of Wound/Ulcer Post Improved Debridement: Severity of Tissue Post Debridement: Fat layer exposed Post Procedure Diagnosis Same as  Pre-procedure Electronic Signature(s) Signed: 03/09/2019 6:22:02 PM By: Baruch Gouty RN, BSN Signed: 03/10/2019 10:36:32 AM By: Worthy Keeler PA-C Entered By: Baruch Gouty on 03/09/2019 17:31:51 -------------------------------------------------------------------------------- Debridement Details Patient Name: Date of Service: [name] 03/09/2019 4:00 PM Medical Record [id number] Patient Account Number: [id number] Date of Birth/Sex: Treating RN: 05-Feb-1946 (74 y.o. [name] Primary Care Provider: [name] Other Clinician: Referring Provider: Treating Provider/Extender:Stone III, Lemar Livings, Lendon Ka Weeks in Treatment: 2 Debridement Performed for Wound #7 Left,Lateral Metatarsal head fifth Assessment: Performed By: Physician Worthy Keeler, PA Debridement Type: Debridement Severity of Tissue Pre Fat layer exposed Debridement: Level of Consciousness (Pre- Awake and Alert procedure): Pre-procedure Verification/Time Out Taken: Yes - 17:20 Start Time: 17:33 Total Area Debrided (L x W): 0.7 (cm) x 0.5 (cm) = 0.35 (cm) Tissue and other material Tissue and other material Viable, Non-Viable, Slough, Subcutaneous, Slough debrided: Level: Skin/Subcutaneous Tissue Debridement Description: Excisional Instrument: Curette Bleeding: Minimum Hemostasis Achieved: Pressure End Time: 17:35 Procedural Pain: 4 Post Procedural Pain: 2 Response to Treatment: Procedure was tolerated well Level of Consciousness Awake and Alert (Post-procedure): Post Debridement Measurements of Total Wound  Length: (cm) 0.7 Width: (cm) 0.5 Depth: (cm) 0.6 Volume: (cm) 0.165 Character of Wound/Ulcer Post Improved Debridement: Bone involvement without Severity of Tissue Post Debridement: necrosis Post Procedure Diagnosis Same as Pre-procedure Notes undermines9-10 o'clock 0.8 cm Electronic Signature(s) Signed: 03/09/2019 6:22:02 PM By: Baruch Gouty RN,  BSN Signed: 03/10/2019 10:36:32 AM By: Worthy Keeler PA-C Entered By: Baruch Gouty on 03/09/2019 17:38:05 -------------------------------------------------------------------------------- HPI Details Patient Name: Date of Service: [name] 03/09/2019 4:00 PM Medical Record [id number] Patient Account Number: [id number] Date of Birth/Sex: Treating RN: 1946-02-23 (73 y.o. [name] Primary Care Provider: [name] Other Clinician: Referring Provider: Treating Provider/Extender:Stone III, Lemar Livings, Lendon Ka Weeks in Treatment: 2 History of Present Illness HPI Description: 02/23/2019 on evaluation today patient presents for initial evaluation in our clinic concerning issues that she has been having with her bilateral lower extremities as well as her plantar foot on the right. This has been quite some time in regard to the foot ulcer even back as far as the beginning of this year that is 2020. She has had the wounds on her legs a much shorter time but that still has been roughly 2-3 months. Nonetheless she is really not shown signs of a lot of improvement. She tells me that she does have a past medical history of diabetes, venous insufficiency, lymphedema which is mild but nonetheless present, peripheral vascular disease and she has previously been recommended to have a femoral-popliteal bypass but put that off at that point they are basically just monitoring at this time. She has end-stage renal disease with dependence on renal dialysis, hypertension, and obviously wounds of the bilateral lower extremities at this point. She tells me that she is having some discomfort but fortunately nothing too significant at this point which is good news. No fevers, chills, nausea, vomiting, or diarrhea. She is mainly been leaving these areas open to air as much as possible at this point at 1 point she was trying to keep them covered more but that really did not help  either. Fortunately there is no signs of active systemic nor local infection at this time. 03/02/2019 on evaluation today patient appears to be doing well with regard to her wounds. The leg ulcers which were very dry are much more moist at this point and seem to be doing great at this time. Overall I am very pleased with how things seem to be progressing. With regard to her foot ulcer it is showing some signs of slight improvement in my opinion overall there does not appear to be anything worse at least which is also good news. No fevers, chills, nausea, vomiting, or diarrhea. 03/09/2019 on evaluation today patient appears to be doing better with regard to her lower extremity ulcers a lot of the necrotic tissue is loosening up which is good news. In regard to her right plantar foot ulcer this appears to be roughly the same based on what I am seeing currently although there is less callus buildup. Unfortunately she does have an open wound area on the left lateral foot region which unfortunately probes down to bone once this was thoroughly examined. This is something she did not even know was opened she had had a Band-Aid over it just for protection last couple times I saw her. Electronic Signature(s) Signed: 03/10/2019 10:36:32 AM By: Worthy Keeler PA-C Entered By: Worthy Keeler on 03/09/2019 18:16:29 -------------------------------------------------------------------------------- Physical Exam Details Patient Name: Date of Service: [name]. 03/09/2019 4:00 PM Medical  Record [id number] Patient Account Number: [id number] Date of Birth/Sex: Treating RN: February 21, 1946 (73 y.o. [name] Primary Care Provider: [name] Other Clinician: Referring Provider: Treating Provider/Extender:Stone III, Lemar Livings, Lendon Ka Weeks in Treatment: 2 Constitutional Well-nourished and well-hydrated in no acute distress. Respiratory normal breathing without  difficulty. Psychiatric this patient is able to make decisions and demonstrates good insight into disease process. Alert and Oriented x 3. pleasant and cooperative. Notes Upon inspection today patient's wound bed actually showed signs of loosening as far as the necrotic tissue is concerned in the lower extremity ulcers. I was able to debride away some of the necrotic tissue more effectively today it helped finally to have some of this actually more loose than what it has been in the past. Fortunately there is no evidence of active infection at this time. With regard to the right plantar foot ulcer this did require some debridement to clear away slough and necrotic debris she tolerated that without complication only minimal bleeding noted. With regard to the wound on the left lateral foot this was actually new this does probe down to bone and I am not even certain that the bone may not be involved as far as possibility of osteomyelitis is concerned. I am get a recommend sending her for a x-ray of this area in order to further evaluate. Electronic Signature(s) Signed: 03/10/2019 10:36:32 AM By: Worthy Keeler PA-C Entered By: Worthy Keeler on 03/09/2019 18:18:34 -------------------------------------------------------------------------------- Physician Orders Details Patient Name: Date of Service: [name] 03/09/2019 4:00 PM Medical Record [id number] Patient Account Number: [id number] Date of Birth/Sex: Treating RN: 1945-08-22 (73 y.o. [name] Primary Care Provider: [name] Other Clinician: Referring Provider: Treating Provider/Extender:Stone III, Lemar Livings, Lendon Ka Weeks in Treatment: 2 Verbal / Phone Orders: No Diagnosis Coding ICD-10 Coding Code Description E11.621 Type 2 diabetes mellitus with foot ulcer L97.512 Non-pressure chronic ulcer of 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d I87.2 Venous insufficiency (chronic)  (peripheral) I89.0 Lymphedema, not elsewhere classified L97.812 Non-pressure chronic ulcer of other part of right lower leg with fat layer exposed L97.822 Non-pressure chronic ulcer of other part of left lower leg with fat layer exposed I73.89 Other specified peripheral vascular diseases N18.6 End stage renal disease Z99.2 Dependence on renal dialysis I10 Essential (primary) hypertension Follow-up Appointments Return Appointment in 1 week. Dressing Change Frequency Wound #2 Right,Plantar Foot Do not change entire dressing for one week. Wound #4 Right,Medial Lower Leg Do not change entire dressing for one week. Wound #5 Left,Medial Lower Leg Do not change entire dressing for one week. Wound #6 Left,Proximal,Medial Lower Leg Do not change entire dressing for one week. Skin Barriers/Peri-Wound Care Moisturizing lotion - both legs Wound Cleansing May shower with protection. Primary Wound Dressing Wound #2 Right,Plantar Foot Calcium Alginate with Silver - pack lightly into wound Wound #7 Left,Lateral Metatarsal head fifth Calcium Alginate with Silver - pack lightly into wound Wound #4 Right,Medial Lower Leg Santyl Ointment Hydrogel - thin layer over santyl covered with adaptic Wound #5 Left,Medial Lower Leg Santyl Ointment Hydrogel - thin layer over santyl covered with adaptic Wound #6 Left,Proximal,Medial Lower Leg Santyl Ointment Hydrogel - thin layer over santyl covered with adaptic Secondary Dressing Wound #2 Right,Plantar Foot Foam - foam donut Kerlix/Rolled Gauze Dry Gauze Drawtex - cut to fit inside wound edges to hold collagen in place Wound #4 Right,Medial Lower Leg Dry Gauze ABD pad Wound #5 Left,Medial Lower Leg Dry Gauze ABD pad Wound #6  Left,Proximal,Medial Lower Leg Dry Gauze ABD pad Edema Control Kerlix and Coban - Bilateral Avoid standing for long periods of time Elevate legs to the level of the heart or above for 30 minutes daily and/or when sitting,  a frequency of: - whenever sitting Exercise regularly Radiology X-ray, foot right complete view - right plantar foot diabetic foot ulcer CPT 73630 - (ICD10 L97.512 - Non- pressure chronic ulcer of 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d) X-ray, foot left complete view - left 5th met head diabetic foot ulcer CPT 73630 Patient Medications Allergies: atenolol, Iodinated Contrast Media, iohexol, latex Notifications Medication Indication Start End benzocaine prior to 03/09/2019 debridement DOSE topical 20 % aerosol - aerosol topical Electronic Signature(s) Signed: 03/09/2019 6:22:02 PM By: Baruch Gouty RN, BSN Signed: 03/10/2019 10:36:32 AM By: Worthy Keeler PA-C Entered By: Baruch Gouty on 03/09/2019 17:42:48 -------------------------------------------------------------------------------- Prescription 03/09/2019 Patient Name: [name] Provider: Worthy Keeler PA Date of Birth: 05-24-1945 NPI#: [id number] Sex: F DEA#: [id number] Phone #: [phone] License #: Patient Address: [facility] [address] [place], [place] [address] [address] [place], [place] [address] [phone] Allergies atenolol Reaction: rash, exacerbates gout Severity: Moderate Iodinated Contrast Media Reaction: Rash Severity: Moderate iohexol Reaction: rash Severity: Moderate latex Reaction: rash Severity: Moderate Provider's Orders X-ray, foot right complete view - ICD10: L97.512 - right plantar foot diabetic foot ulcer CPT 73630 Signature(s): Date(s): Prescription 03/09/2019 Patient Name: [name] Provider: Worthy Keeler PA Date of Birth: 1945/11/24 NPI#: [id number] Sex: F DEA#: [id number] Phone #: [phone] License #: Patient Address: [facility] [place] [place], [place] [address] [address] [place], [place]  [address] [phone] Allergies atenolol Reaction: rash, exacerbates gout Severity: Moderate Iodinated Contrast Media Reaction: Rash Severity: Moderate iohexol Reaction: rash Severity: Moderate latex Reaction: rash Severity: Moderate Provider's Orders X-ray, foot left complete view - left 5th met head diabetic foot ulcer CPT 32355 Signature(s): Date(s): Electronic Signature(s) Signed: 03/09/2019 6:22:02 PM By: Baruch Gouty RN, BSN Signed: 03/10/2019 10:36:32 AM By: Worthy Keeler PA-C Entered By: Baruch Gouty on 03/09/2019 17:42:49 --------------------------------------------------------------------------------  Problem List Details Patient Name: Date of Service: [name] 03/09/2019 4:00 PM Medical Record [id number] Patient Account Number: [id number] Date of Birth/Sex: Treating RN: [birth year] (73 y.o. 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 03/09/2019 No Yes with bone involvement without evidence of necrosis I87.2 Venous insufficiency (chronic) (peripheral) 02/23/2019 No Yes I89.0 Lymphedema, not elsewhere classified 02/23/2019 No Yes L97.812 Non-pressure chronic ulcer of other part of right lower 02/23/2019 No Yes leg with fat layer exposed L97.822 Non-pressure chronic ulcer of other part of left lower 02/23/2019 No Yes leg with fat layer exposed I73.89 Other specified peripheral vascular diseases 02/23/2019 No Yes N18.6 End stage renal disease 02/23/2019 No Yes Z99.2 Dependence on renal  dialysis 02/23/2019 No Yes I10 Essential (primary) hypertension 02/23/2019 No Yes Inactive Problems Resolved Problems Electronic Signature(s) Signed: 03/10/2019 10:36:32 AM By: Worthy Keeler PA-C Entered By: Worthy Keeler on 03/09/2019 17:46:30 -------------------------------------------------------------------------------- Progress  Note Details Patient Name: Date of Service: [name], [name] 03/09/2019 4:00 PM Medical Record [id number] Patient Account Number: [id number] Date of Birth/Sex: Treating RN: [birth year] (73 y.o. [name] Primary Care Provider: [name] Other Clinician: Referring Provider: Treating Provider/Extender:Stone III, Lemar Livings, Lendon Ka Weeks in Treatment: 2 Subjective Chief Complaint Information obtained from Patient Bilateral foot and bilateral LE ulcers History of Present Illness (HPI) 02/23/2019 on evaluation today patient presents for initial evaluation in our clinic concerning issues that she has been having with her bilateral lower extremities as well as her plantar foot on the right. This has been quite some time in regard to the foot ulcer even back as far as the beginning of this year that is 2020. She has had the wounds on her legs a much shorter time but that still has been roughly 2-3 months. Nonetheless she is really not shown signs of a lot of improvement. She tells me that she does have a past medical history of diabetes, venous insufficiency, lymphedema which is mild but nonetheless present, peripheral vascular disease and she has previously been recommended to have a femoral-popliteal bypass but put that off at that point they are basically just monitoring at this time. She has end-stage renal disease with dependence on renal dialysis, hypertension, and obviously wounds of the bilateral lower extremities at this point. She tells me that she is having some discomfort but fortunately nothing too significant at this  point which is good news. No fevers, chills, nausea, vomiting, or diarrhea. She is mainly been leaving these areas open to air as much as possible at this point at 1 point she was trying to keep them covered more but that really did not help either. Fortunately there is no signs of active systemic nor local infection at this time. 03/02/2019 on evaluation today patient appears to be doing well with regard to her wounds. The leg ulcers which were very dry are much more moist at this point and seem to be doing great at this time. Overall I am very pleased with how things seem to be progressing. With regard to her foot ulcer it is showing some signs of slight improvement in my opinion overall there does not appear to be anything worse at least which is also good news. No fevers, chills, nausea, vomiting, or diarrhea. 03/09/2019 on evaluation today patient appears to be doing better with regard to her lower extremity ulcers a lot of the necrotic tissue is loosening up which is good news. In regard to her right plantar foot ulcer this appears to be roughly the same based on what I am seeing currently although there is less callus buildup. Unfortunately she does have an open wound area on the left lateral foot region which unfortunately probes down to bone once this was thoroughly examined. This is something she did not even know was opened she had had a Band-Aid over it just for protection last couple times I saw her. Patient History Information obtained from Patient. Family History Cancer - Father,Siblings, Diabetes - Maternal Grandparents,Paternal Grandparents,Mother,Father,Siblings, Heart Disease - Maternal Grandparents,Paternal Grandparents,Mother,Father,Siblings, Hypertension - Maternal Grandparents,Paternal Grandparents, Kidney Disease - Siblings, No family history of Hereditary Spherocytosis, Lung Disease, Seizures, Stroke, Thyroid Problems, Tuberculosis. Social History Never smoker, Marital  Status - Married, Alcohol Use - Never, Drug Use - No History, Caffeine Use - Rarely. Medical History Eyes Patient has history of Cataracts - removed Cardiovascular Patient has history of Congestive Heart Failure, Coronary Artery Disease, Hypertension, Peripheral Arterial  Disease, Peripheral Venous Disease Endocrine Patient has history of Type II Diabetes Genitourinary Patient has history of End Stage Renal Disease - Hemodialysis 3x/week Integumentary (Skin) Denies history of History of Burn Musculoskeletal Patient has history of Osteoarthritis Neurologic Patient has history of Neuropathy Oncologic Patient has history of Received Radiation Medical And Surgical History Notes Cardiovascular Hyperlipidemia, Cardiomyopathy Oncologic Breast cancer 2014 with lumpectomy Review of Systems (ROS) Constitutional Symptoms (General Health) Denies complaints or symptoms of Fatigue, Fever, Chills, Marked Weight Change. Respiratory Denies complaints or symptoms of Chronic or frequent coughs, Shortness of Breath. Cardiovascular Denies complaints or symptoms of Chest pain. Psychiatric Denies complaints or symptoms of Claustrophobia, Suicidal. Objective Constitutional Well-nourished and well-hydrated in no acute distress. Vitals Time Taken: 4:45 PM, Height: 65 in, Weight: 162 lbs, BMI: 27, Temperature: 98.5 F, Pulse: 87 bpm, Respiratory Rate: 20 breaths/min, Blood Pressure: 139/57 mmHg, Capillary Blood Glucose: 69 mg/dl. General Notes: per patient ate and BG rechecked 199. Respiratory normal breathing without difficulty. Psychiatric this patient is able to make decisions and demonstrates good insight into disease process. Alert and Oriented x 3. pleasant and cooperative. General Notes: Upon inspection today patient's wound bed actually showed signs of loosening as far as the necrotic tissue is concerned in the lower extremity ulcers. I was able to debride away some of the necrotic  tissue more effectively today it helped finally to have some of this actually more loose than what it has been in the past. Fortunately there is no evidence of active infection at this time. With regard to the right plantar foot ulcer this did require some debridement to clear away slough and necrotic debris she tolerated that without complication only minimal bleeding noted. With regard to the wound on the left lateral foot this was actually new this does probe down to bone and I am not even certain that the bone may not be involved as far as possibility of osteomyelitis is concerned. I am get a recommend sending her for a x-ray of this area in order to further evaluate. Integumentary (Hair, Skin) Wound #2 status is Open. Original cause of wound was Gradually Appeared. The wound is located on the [place]. The wound measures 0.4cm length x 0.9cm width x 0.3cm depth; 0.283cm^2 area and 0.085cm^3 volume. There is Fat Layer (Subcutaneous Tissue) Exposed exposed. There is no tunneling or undermining noted. There is a medium amount of serosanguineous drainage noted. The wound margin is thickened. There is large (67-100%) red granulation within the wound bed. There is no necrotic tissue within the wound bed. Wound #3 status is Healed - Epithelialized. Original cause of wound was Blister. The wound is located on the Right,Anterior Lower Leg. The wound measures 0cm length x 0cm width x 0cm depth; 0cm^2 area and 0cm^3 volume. There is Fat Layer (Subcutaneous Tissue) Exposed exposed. There is no tunneling or undermining noted. There is a none present amount of drainage noted. The wound margin is flat and intact. There is no granulation within the wound bed. There is no necrotic tissue within the wound bed. Wound #4 status is Open. Original cause of wound was Blister. The wound is located on the Right,Medial Lower Leg. The wound measures 4.2cm length x 4cm width x 0.1cm depth; 13.195cm^2 area and  1.319cm^3 volume. There is Fat Layer (Subcutaneous Tissue) Exposed exposed. There is no tunneling or undermining noted. There is a medium amount of serosanguineous drainage noted. The wound margin is flat and intact. There is medium (34-66%) red, pink granulation within the  wound bed. There is a medium (34-66%) amount of necrotic tissue within the wound bed including Adherent Slough. Wound #5 status is Open. Original cause of wound was Blister. The wound is located on the Left,Medial Lower Leg. The wound measures 2.5cm length x 2.8cm width x 0.1cm depth; 5.498cm^2 area and 0.55cm^3 volume. There is Fat Layer (Subcutaneous Tissue) Exposed exposed. There is no tunneling or undermining noted. There is a medium amount of serosanguineous drainage noted. The wound margin is flat and intact. There is small (1-33%) pink granulation within the wound bed. There is a large (67-100%) amount of necrotic tissue within the wound bed including Adherent Slough. Wound #6 status is Open. Original cause of wound was Blister. The wound is located on the Left,Proximal,Medial Lower Leg. The wound measures 2.2cm length x 2cm width x 0.1cm depth; 3.456cm^2 area and 0.346cm^3 volume. There is Fat Layer (Subcutaneous Tissue) Exposed exposed. There is no tunneling or undermining noted. There is a medium amount of serosanguineous drainage noted. The wound margin is flat and intact. There is small (1-33%) red, pink granulation within the wound bed. There is a large (67-100%) amount of necrotic tissue within the wound bed including Adherent Slough. Wound #7 status is Open. Original cause of wound was Gradually Appeared. The wound is located on the Left,Lateral Metatarsal head fifth. The wound measures 0.4cm length x 0.3cm width x 0.2cm depth; 0.094cm^2 area and 0.019cm^3 volume. There is Fat Layer (Subcutaneous Tissue) Exposed exposed. There is no tunneling noted. There is a small amount of serosanguineous drainage noted. The  wound margin is well defined and not attached to the wound base. There is large (67-100%) pink, pale granulation within the wound bed. There is a small (1-33%) amount of necrotic tissue within the wound bed including Adherent Slough. 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 There was a Excisional Skin/Subcutaneous Tissue Debridement with a total area of 0.36 sq cm performed by Worthy Keeler, PA. With the following instrument(s): Curette to remove Viable and Non-Viable tissue/material. Material removed includes Callus, Subcutaneous Tissue, and Slough after achieving pain control using Other (BENZOCAINE 20% spray). No specimens were taken. A time out was conducted at 17:20, prior to the start of the procedure. A Minimum amount of bleeding was controlled with Pressure. The procedure was tolerated well with a pain level of 2 throughout and a pain level of 0 following the procedure. Post Debridement Measurements: 0.4cm length x 0.9cm width x 0.3cm depth; 0.085cm^3 volume. Character of Wound/Ulcer Post Debridement is improved. Severity of Tissue Post Debridement is: Fat layer exposed. Post procedure Diagnosis  Wound #2: Same as Pre-Procedure Wound #4 Pre-procedure diagnosis of Wound #4 is a Venous Leg Ulcer located on the Right,Medial Lower Leg .Severity of Tissue Pre Debridement is: Fat layer exposed. There was a Excisional Skin/Subcutaneous Tissue Debridement with a total  area of 16.8 sq cm performed by Worthy Keeler, PA. With the following instrument(s): Curette to remove Viable and Non-Viable tissue/material. Material removed includes Subcutaneous Tissue and Slough and after achieving pain control using Other (BENZOCAINE 20% spray). No specimens were taken. A time out was conducted at 17:20, prior to the start of the procedure. A Minimum amount of bleeding was controlled with Pressure. The procedure was tolerated well with a pain level of 5 throughout and a pain level of 2 following the procedure. Post Debridement Measurements: 4.2cm length x 4cm width x 0.1cm depth; 1.319cm^3 volume. Character of Wound/Ulcer Post Debridement is improved. Severity of Tissue Post Debridement is: Fat layer exposed. Post procedure Diagnosis Wound #4: Same as Pre-Procedure Wound #5 Pre-procedure diagnosis of Wound #5 is a Venous Leg Ulcer located on the Left,Medial Lower Leg .Severity of Tissue Pre Debridement is: Fat layer exposed. There was a Excisional Skin/Subcutaneous Tissue Debridement with a total area of 7 sq cm performed by Worthy Keeler, PA. With the following instrument(s): Curette to remove Viable and Non-Viable tissue/material. Material removed includes Subcutaneous Tissue and Slough and after achieving pain control using Other (BENZOCAINE 20% spray). No specimens were taken. A time out was conducted at 17:20, prior to the start of the procedure. A Minimum amount of bleeding was controlled with Pressure. The procedure was tolerated well with a pain level of 4 throughout and a pain level of 2 following the procedure. Post Debridement Measurements: 2.5cm length x 2.8cm width x 0.1cm depth; 0.55cm^3  volume. Character of Wound/Ulcer Post Debridement is improved. Severity of Tissue Post Debridement is: Fat layer exposed. Post procedure Diagnosis Wound #5: Same as Pre-Procedure Wound #6 Pre-procedure diagnosis of Wound #6 is a Venous Leg Ulcer located on the Left,Proximal,Medial Lower Leg .Severity of Tissue Pre Debridement is: Fat layer exposed. There was a Excisional Skin/Subcutaneous Tissue Debridement with a total area of 4.4 sq cm performed by Worthy Keeler, PA. With the following instrument(s): Curette to remove Viable and Non-Viable tissue/material. Material removed includes Subcutaneous Tissue and Slough and after achieving pain control using Other (BENZOCAINE 20% spray). No specimens were taken. A time out was conducted at 17:20, prior to the start of the procedure. A Minimum amount of bleeding was controlled with Pressure. The procedure was tolerated well with a pain level of 4 throughout and a pain level of 2 following the procedure. Post Debridement Measurements: 2.2cm length x 2.4cm width x 0.1cm depth; 0.415cm^3 volume. Character of Wound/Ulcer Post Debridement is improved. Severity of Tissue Post Debridement is: Fat layer exposed. Post procedure Diagnosis Wound #6: Same as Pre-Procedure Wound #7 Pre-procedure diagnosis of Wound #7 is a Diabetic Wound/Ulcer of the Lower Extremity located on the Left,Lateral Metatarsal head fifth .Severity of Tissue Pre Debridement is: Fat layer exposed. There was a Excisional Skin/Subcutaneous Tissue Debridement with a total area of 0.35 sq cm performed by Worthy Keeler, PA. With the following instrument(s): Curette to remove Viable and Non-Viable tissue/material. Material removed includes Subcutaneous Tissue and Slough and. No specimens were taken. A time out was conducted at 17:20, prior to the start of the procedure. A Minimum amount of bleeding was controlled with Pressure. The procedure was tolerated well with a pain level of 4  throughout and a pain level of 2 following the procedure. Post Debridement Measurements: 0.7cm length x 0.5cm width x 0.6cm depth; 0.165cm^3 volume. Character of Wound/Ulcer Post Debridement is improved. Severity of Tissue Post Debridement is: Bone involvement without necrosis. Post procedure Diagnosis  Wound #7: Same as Pre-Procedure General Notes: undermines9-10 o'clock 0.8 cm. Plan Follow-up Appointments: Return Appointment in 1 week. Dressing Change Frequency: Wound #2 Right,Plantar Foot: Do not change entire dressing for one week. Wound #4 Right,Medial Lower Leg: Do not change entire dressing for one week. Wound #5 Left,Medial Lower Leg: Do not change entire dressing for one week. Wound #6 Left,Proximal,Medial Lower Leg: Do not change entire dressing for one week. Skin Barriers/Peri-Wound Care: Moisturizing lotion - both legs Wound Cleansing: May shower with protection. Primary Wound Dressing: Wound #2 Right,Plantar Foot: Calcium Alginate with Silver - pack lightly into wound Wound #7 Left,Lateral Metatarsal head fifth: Calcium Alginate with Silver - pack lightly into wound Wound #4 Right,Medial Lower Leg: Santyl Ointment Hydrogel - thin layer over santyl covered with adaptic Wound #5 Left,Medial Lower Leg: Santyl Ointment Hydrogel - thin layer over santyl covered with adaptic Wound #6 Left,Proximal,Medial Lower Leg: Santyl Ointment Hydrogel - thin layer over santyl covered with adaptic Secondary Dressing: Wound #2 Right,Plantar Foot: Foam - foam donut Kerlix/Rolled Gauze Dry Gauze Drawtex - cut to fit inside wound edges to hold collagen in place Wound #4 Right,Medial Lower Leg: Dry Gauze ABD pad Wound #5 Left,Medial Lower Leg: Dry Gauze ABD pad Wound #6 Left,Proximal,Medial Lower Leg: Dry Gauze ABD pad Edema Control: Kerlix and Coban - Bilateral Avoid standing for long periods of time Elevate legs to the level of the heart or above for 30 minutes daily  and/or when sitting, a frequency of: - whenever sitting Exercise regularly Radiology ordered were: X-ray, foot right complete view - right plantar foot diabetic foot ulcer CPT 73630, X-ray, foot left complete view - left 5th met head diabetic foot ulcer CPT 73630 The following medication(s) was prescribed: benzocaine topical 20 % aerosol aerosol topical for prior to debridement was prescribed at facility 1. I would recommend currently that we initiate treatment for both foot ulcers with a silver alginate dressing I think this would be better for her. 2. I am also going to recommend that we continue with the Santyl followed by hydrogel as well as the Adaptic to help to keep the areas moist on her legs which does seem to be helping the area to heal more effectively. 3. I am also going to suggest that we continue with the bilateral Curlex and Coban wraps that does seem to be doing a good job for the patient. 4. She still needs to continue to elevate her legs as much as possible. 5. I am going to send her to x-ray for the left foot as well as the right foot in order to evaluate for any evidence of osteomyelitis. I am especially concerned about this on the left. We will see patient back for reevaluation in 1 week here in the clinic. If anything worsens or changes patient will contact our office for additional recommendations. Electronic Signature(s) Signed: 03/10/2019 10:36:32 AM By: Worthy Keeler PA-C Entered By: Worthy Keeler on 03/09/2019 18:20:03 -------------------------------------------------------------------------------- HxROS Details Patient Name: Date of Service: [name] 03/09/2019 4:00 PM Medical Record [id number] Patient Account Number: [id number] Date of Birth/Sex: Treating RN: [birth year] (73 y.o. [name] Primary Care Provider: [name] Other Clinician: Referring Provider: Treating Provider/Extender:Stone III, Lemar Livings, Lendon Ka Weeks in Treatment: 2 Information Obtained From Patient Constitutional Symptoms (General Health) Complaints and Symptoms: Negative for: Fatigue; Fever; Chills; Marked Weight Change Respiratory Complaints and Symptoms: Negative for: Chronic or frequent coughs; Shortness of Breath Cardiovascular Complaints and Symptoms: Negative for: Chest  pain Medical History: Positive for: Congestive Heart Failure; Coronary Artery Disease; Hypertension; Peripheral Arterial Disease; Peripheral Venous Disease Past Medical History Notes: Hyperlipidemia, Cardiomyopathy Psychiatric Complaints and Symptoms: Negative for: Claustrophobia; Suicidal Eyes Medical History: Positive for: Cataracts - removed Endocrine Medical History: Positive for: Type II Diabetes Time with diabetes: since 2000 Treated with: Insulin Blood sugar tested every day: Yes Tested : 1-2x a day Genitourinary Medical History: Positive for: End Stage Renal Disease - Hemodialysis 3x/week Integumentary (Skin) Medical History: Negative for: History of Burn Musculoskeletal Medical History: Positive for: Osteoarthritis Neurologic Medical History: Positive for: Neuropathy Oncologic Medical History: Positive for: Received Radiation Past Medical History Notes: Breast cancer 2014 with lumpectomy HBO Extended History Items Eyes: Cataracts Immunizations Pneumococcal Vaccine: Received Pneumococcal Vaccination: Yes Tetanus Vaccine: Last tetanus shot: 01/13/2012 Implantable Devices None Family and Social History Cancer: Yes - Father,Siblings; Diabetes: Yes - Maternal Grandparents,Paternal Grandparents,Mother,Father,Siblings; Heart Disease: Yes - Maternal Grandparents,Paternal Grandparents,Mother,Father,Siblings; Hereditary Spherocytosis: No; Hypertension: Yes - Maternal Grandparents,Paternal Grandparents; Kidney Disease: Yes - Siblings; Lung Disease: No; Seizures: No; Stroke: No; Thyroid Problems: No; Tuberculosis: No;  Never smoker; Marital Status - Married; Alcohol Use: Never; Drug Use: No History; Caffeine Use: Rarely; Financial Concerns: No; Food, Clothing or Shelter Needs: No; Support System Lacking: No; Transportation Concerns: No Physician Affirmation I have reviewed and agree with the above information. Electronic Signature(s) Signed: 03/09/2019 6:22:02 PM By: Baruch Gouty RN, BSN Signed: 03/10/2019 10:36:32 AM By: Worthy Keeler PA-C Entered By: Worthy Keeler on 03/09/2019 18:17:05 -------------------------------------------------------------------------------- SuperBill Details Patient Name: Date of Service: BLISS, BEHNKE 03/09/2019 Medical Record [id number] Patient Account Number: [id number] Date of Birth/Sex: Treating RN: [birth year] (73 y.o. 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 of L97.526 necrosis I87.2 Venous insufficiency (chronic) (peripheral) I89.0 Lymphedema, not elsewhere classified L97.812 Non-pressure chronic ulcer of other part of right lower leg with fat layer exposed L97.822 Non-pressure chronic ulcer of other part of left lower leg with fat layer exposed I73.89 Other specified peripheral vascular diseases N18.6 End stage renal disease Z99.2 Dependence on renal dialysis I10 Essential (primary) hypertension Facility Procedures CPT4: Description Modifier Quantity Code [id number] 11042 - DEB SUBQ TISSUE 20 SQ CM/< 1 ICD-10 Diagnosis Description L97.512 Non-pressure chronic ulcer of 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d L97.812 Non-pressure  chronic ulcer of other part of  right lower leg with fat layer exposed L97.822 Non-pressure chronic ulcer of other part of left lower leg with fat layer exposed L97.526 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 of necrosis CPT4: [id number] 11045 - DEB SUBQ TISS EA ADDL 20CM 1 ICD-10 Diagnosis Description L97.512 Non-pressure chronic ulcer of 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d L97.812 Non-pressure chronic ulcer of other part of right lower leg with fat layer  exposed L97.822 Non-pressure chronic ulcer of other part of left lower leg with fat layer exposed L97.526 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 of necrosis Physician Procedures CPT4: Description Modifier Quantity Code [id number] 11042 - WC PHYS SUBQ TISS 20 SQ CM 1 ICD-10 Diagnosis Description L97.512 Non-pressure chronic ulcer of other part  of right foot with fat layer exposed L97.812 Non-pressure chronic ulcer of other part of  right lower leg with fat layer exposed L97.822 Non-pressure chronic ulcer of other part of left lower leg with fat layer exposed L97.526 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 of necrosis CPT4: [id number] 11045 - WC PHYS SUBQ TISS EA ADDL 20 CM 1 ICD-10 Diagnosis Description L97.512 Non-pressure chronic ulcer of 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d L97.812 Non-pressure chronic ulcer of other part of right lower leg with fat layer  exposed L97.822 Non-pressure chronic ulcer of other part of left lower leg with fat layer exposed L97.526 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 of necrosis Electronic Signature(s) Signed: 03/10/2019 10:36:32 AM By: Worthy Keeler PA-C Entered By: Worthy Keeler on 03/09/2019 18:20:42

## 2019-03-11 DIAGNOSIS — M9903 Segmental and somatic dysfunction of lumbar region: Secondary | ICD-10-CM | POA: Diagnosis not present

## 2019-03-11 DIAGNOSIS — M40292 Other kyphosis, cervical region: Secondary | ICD-10-CM | POA: Diagnosis not present

## 2019-03-11 DIAGNOSIS — M9901 Segmental and somatic dysfunction of cervical region: Secondary | ICD-10-CM | POA: Diagnosis not present

## 2019-03-11 DIAGNOSIS — M5136 Other intervertebral disc degeneration, lumbar region: Secondary | ICD-10-CM | POA: Diagnosis not present

## 2019-03-14 ENCOUNTER — Ambulatory Visit: Payer: Medicare Other | Admitting: Orthopaedic Surgery

## 2019-03-14 DIAGNOSIS — M9901 Segmental and somatic dysfunction of cervical region: Secondary | ICD-10-CM | POA: Diagnosis not present

## 2019-03-14 DIAGNOSIS — M9903 Segmental and somatic dysfunction of lumbar region: Secondary | ICD-10-CM | POA: Diagnosis not present

## 2019-03-14 DIAGNOSIS — M40292 Other kyphosis, cervical region: Secondary | ICD-10-CM | POA: Diagnosis not present

## 2019-03-14 DIAGNOSIS — M5136 Other intervertebral disc degeneration, lumbar region: Secondary | ICD-10-CM | POA: Diagnosis not present

## 2019-03-15 NOTE — Progress Notes (Signed)
ALONA, DANFORD ([id number]) Visit Report for 03/09/2019 Arrival Information Details Patient Name: Date of Service: NIKIE, CID 03/09/2019 4:00 PM Medical Record [id number] Patient Account Number: [id number] Date of Birth/Sex: Treating RN: 03-28-1946 (73 y.o. Helene Shoe, Meta.Reding Primary Care Marshella Tello: [name] Other Clinician: Referring Rodell Marrs: Treating Linus Weckerly/Extender:Stone III, Lemar Livings, Abelino Derrick in Treatment: 2 Visit Information History Since Last Visit Added or deleted any medications: No Patient Arrived: Kasandra Knudsen Any new allergies or adverse reactions: No Arrival Time: 16:43 Had a fall or experienced change in No Accompanied By: self activities of daily living that may affect Transfer Assistance: None risk of falls: Patient Identification Verified: Yes Signs or symptoms of abuse/neglect since last No Secondary Verification Process Yes visito Completed: Hospitalized since last visit: No Patient Requires Transmission- No Implantable device outside of the clinic excluding No Based Precautions: cellular tissue based products placed in the center Patient Has Alerts: Yes since last visit: Patient Alerts: R ABI non Has Dressing in Place as Prescribed: Yes compressible Has Compression in Place as Prescribed: Yes L ABI non Pain Present Now: No compressible Electronic Signature(s) Signed: 03/09/2019 6:36:09 PM By: Deon Pilling Entered By: Deon Pilling on 03/09/2019 16:44:02 -------------------------------------------------------------------------------- Encounter Discharge Information Details Patient Name: Date of Service: [name] 03/09/2019 4:00 PM Medical Record [id number] Patient Account Number: [id number] Date of Birth/Sex: Treating RN: November 02, 1945 (73 y.o. [name] Primary Care Edoardo Laforte: [name] Other Clinician: Referring Aileena Iglesia: Treating Layson Bertsch/Extender:Stone III, Lemar Livings, Abelino Derrick  in Treatment: 2 Encounter Discharge Information Items Post Procedure Vitals Discharge Condition: Stable Temperature (F): 98.5 Ambulatory Status: Cane Pulse (bpm): 87 Discharge Destination: Home Respiratory Rate (breaths/min): 20 Transportation: Private Auto Blood Pressure (mmHg): 139/57 Accompanied By: self Schedule Follow-up Appointment: Yes Clinical Summary of Care: Patient Declined Electronic Signature(s) Signed: 03/15/2019 2:52:36 PM By: Carlene Coria RN Entered By: Carlene Coria on 03/09/2019 18:20:41 -------------------------------------------------------------------------------- Lower Extremity Assessment Details Patient Name: Date of Service: MARIFER, HURD 03/09/2019 4:00 PM Medical Record [id number] Patient Account Number: [id number] Date of Birth/Sex: Treating RN: 1945/10/26 (73 y.o. Debby Bud Primary Care Treshun Wold: [name] Other Clinician: Referring Codi Folkerts: Treating Yuma Blucher/Extender:Stone III, Lemar Livings, Lendon Ka Weeks in Treatment: 2 Edema Assessment Assessed: [Left: No] [Right: No] Edema: [Left: Yes] [Right: Yes] Calf Left: Right: Point of Measurement: 32 cm From Medial Instep 30.5 cm 30 cm Ankle Left: Right: Point of Measurement: 10 cm From Medial Instep 19.5 cm 19 cm Vascular Assessment Pulses: Dorsalis Pedis Palpable: [Left:Yes] [Right:Yes] Electronic Signature(s) Signed: 03/09/2019 6:36:09 PM By: Deon Pilling Entered By: Deon Pilling on 03/09/2019 16:49:43 -------------------------------------------------------------------------------- Multi-Disciplinary Care Plan Details Patient Name: Date of Service: [name] 03/09/2019 4:00 PM Medical Record [id number] Patient Account Number: [id number] Date of Birth/Sex: Treating RN: 1945-08-11 (73 y.o. [name] Primary Care Xadrian Craighead: Other Clinician: [name] Referring Sipriano Fendley: Treating Gilverto Dileonardo/Extender:Stone III, Lemar Livings, Lendon Ka Weeks in Treatment: 2 Active Inactive Abuse / Safety / Falls / Self Care Management Nursing Diagnoses: Potential for falls Goals: Patient/caregiver will verbalize/demonstrate measures taken to prevent injury and/or falls Date Initiated: 02/23/2019 Target Resolution Date: 03/23/2019 Goal Status: Active Interventions: Assess fall risk on admission and as needed Notes: Nutrition Nursing Diagnoses: Impaired glucose control: actual or potential Potential for alteratiion in Nutrition/Potential for imbalanced nutrition Goals: Patient/caregiver will maintain therapeutic glucose control Date Initiated: 02/23/2019 Target Resolution Date: 03/23/2019 Goal Status: Active Interventions: Assess HgA1c results as ordered upon admission and as needed Provide education on elevated blood sugars  and impact on wound healing Treatment Activities: Patient referred to Primary Care Physician for further nutritional evaluation : 02/23/2019 Notes: Venous Leg Ulcer Nursing Diagnoses: Knowledge deficit related to disease process and management Potential for venous Insuffiency (use before diagnosis confirmed) Goals: Patient will maintain optimal edema control Date Initiated: 02/23/2019 Target Resolution Date: 03/23/2019 Goal Status: Active Interventions: Compression as ordered Treatment Activities: Therapeutic compression applied : 02/23/2019 Notes: Wound/Skin Impairment Nursing Diagnoses: Impaired tissue integrity Knowledge deficit related to ulceration/compromised skin integrity Goals: Patient/caregiver will verbalize understanding of skin care regimen Date Initiated: 02/23/2019 Target Resolution Date: 03/23/2019 Goal Status: Active Ulcer/skin breakdown will have a volume reduction of 30% by week 4 Date Initiated: 02/23/2019 Target Resolution Date: 03/23/2019 Goal Status: Active Interventions: Assess patient/caregiver ability to obtain necessary supplies Assess patient/caregiver  ability to perform ulcer/skin care regimen upon admission and as needed Assess ulceration(s) every visit Treatment Activities: Skin care regimen initiated : 02/23/2019 Topical wound management initiated : 02/23/2019 Notes: Electronic Signature(s) Signed: 03/09/2019 6:22:02 PM By: Baruch Gouty RN, BSN Entered By: Baruch Gouty on 03/09/2019 17:21:27 -------------------------------------------------------------------------------- Pain Assessment Details Patient Name: Date of Service: [name] 03/09/2019 4:00 PM Medical Record [id number] Patient Account Number: [id number] Date of Birth/Sex: Treating RN: 27-Sep-1945 (73 y.o. Debby Bud Primary Care Teaghan Formica: [name] Other Clinician: Referring Javoni Lucken: Treating Akeisha Lagerquist/Extender:Stone III, Lemar Livings, Lendon Ka Weeks in Treatment: 2 Active Problems Location of Pain Severity and Description of Pain Patient Has Paino No Site Locations Rate the pain. Current Pain Level: 0 Pain Management and Medication Current Pain Management: Medication: No Cold Application: No Rest: No Massage: No Activity: No T.E.N.S.: No Heat Application: No Leg drop or elevation: No Is the Current Pain Management Adequate: Adequate How does your wound impact your activities of daily livingo Sleep: No Bathing: No Appetite: No Relationship With Others: No Bladder Continence: No Emotions: No Bowel Continence: No Work: No Toileting: No Drive: No Dressing: No Hobbies: No Electronic Signature(s) Signed: 03/09/2019 6:36:09 PM By: Deon Pilling Entered By: Deon Pilling on 03/09/2019 16:44:14 -------------------------------------------------------------------------------- Patient/Caregiver Education Details Patient Name: Date of Service: Hardman, Malayia J. 12/2/2020andnbsp4:00 PM Medical Record [id number] Patient Account Number: [id number] Date of Birth/Gender: Treating RN: 07-04-1945 (73 y.o. [name] Primary Care Physician: [name] Other Clinician: Referring Physician: Treating Physician/Extender:Stone III, Lemar Livings, Abelino Derrick in Treatment: 2 Education Assessment Education Provided To: Patient Education Topics Provided Elevated Blood Sugar/ Impact on Healing: Methods: Explain/Verbal Responses: Reinforcements needed, State content correctly Offloading: Methods: Explain/Verbal Responses: Reinforcements needed, State content correctly Wound/Skin Impairment: Methods: Explain/Verbal Responses: Reinforcements needed, State content correctly Electronic Signature(s) Signed: 03/09/2019 6:22:02 PM By: Baruch Gouty RN, BSN Entered By: Baruch Gouty on 03/09/2019 17:21:59 -------------------------------------------------------------------------------- Wound Assessment Details Patient Name: Date of Service: [name] 03/09/2019 4:00 PM Medical Record [id number] Patient Account Number: [id number] Date of Birth/Sex: Treating RN: 1946/02/06 (73 y.o. Helene Shoe, Meta.Reding Primary Care Myrlene Riera: [name] Other Clinician: Referring Marki Frede: Treating Jashley Yellin/Extender:Stone III, Lemar Livings, Lendon Ka Weeks in Treatment: 2 Wound Status Wound Number: 2 Primary Diabetic Wound/Ulcer of the Lower Extremity Etiology: Wound Location: Right Foot - Plantar Wound Open Wounding Event: Gradually Appeared Status: Date Acquired: 12/06/2017 Comorbid Cataracts, Congestive Heart Failure, Coronary Weeks Of Treatment: 2 History: Artery Disease, Hypertension, Peripheral Clustered Wound: No Arterial Disease, Peripheral Venous Disease, Type II Diabetes, End Stage Renal Disease, Osteoarthritis, Neuropathy, Received Radiation Photos Wound Measurements Length: (cm) 0.4 Width: (cm) 0.9 Depth: (cm) 0.3 Area: (cm) 0.283 Volume: (cm) 0.085 Wound  Description Classification: Grade 2 Wound Margin: Thickened Exudate Amount: Medium Exudate Type:  Serosanguineous Exudate Color: red, brown Wound Bed Granulation Amount: Large (67-100%) Granulation Quality: Red Necrotic Amount: None Present (0%) After Cleansing: No brino No Exposed Structure osed: No (Subcutaneous Tissue) Exposed: Yes osed: No osed: No sed: No ed: No % Reduction in Area: 54.9% % Reduction in Volume: 72.9% Epithelialization: Small (1-33%) Tunneling: No Undermining: No Foul Odor Slough/Fi Fascia Exp Fat Layer Tendon Exp Muscle Exp Joint Expo Bone Expos Treatment Notes Wound #2 (Right, Plantar Foot) 1. Cleanse With Wound Cleanser Soap and water 3. Primary Dressing Applied Calcium Alginate Ag 4. Secondary Dressing Dry Gauze 6. Support Layer Holiday representative) Signed: 03/10/2019 3:43:57 PM By: Mikeal Hawthorne EMT/HBOT Signed: 03/10/2019 6:37:14 PM By: Deon Pilling Previous Signature: 03/09/2019 6:36:09 PM Version By: Deon Pilling Entered By: Mikeal Hawthorne on 03/10/2019 14:18:23 -------------------------------------------------------------------------------- Wound Assessment Details Patient Name: Date of Service: [name] 03/09/2019 4:00 PM Medical Record [id number] Patient Account Number: [id number] Date of Birth/Sex: Treating RN: 1945-10-19 (73 y.o. [name], Vaughan Basta Primary Care Nicholaos Schippers: [name] Other Clinician: Referring Veola Cafaro: Treating Nusayba Cadenas/Extender:Stone III, Lemar Livings, Lendon Ka Weeks in Treatment: 2 Wound Status Wound Number: 3 Primary Venous Leg Ulcer Etiology: Wound Location: Right Lower Leg - Anterior Wound Healed - Epithelialized Wounding Event: Blister Status: Date Acquired: 01/06/2019 Comorbid Cataracts, Congestive Heart Failure, Coronary Weeks Of Treatment: 2 History: Artery Disease, Hypertension, Peripheral Clustered Wound: No Arterial Disease, Peripheral Venous Disease, Type II Diabetes, End Stage Renal Disease, Osteoarthritis, Neuropathy,  Received Radiation Photos Wound Measurements Length: (cm) 0 % Reduct Width: (cm) 0 % Reduct Depth: (cm) 0 Epitheli Area: (cm) 0 Tunneli Volume: (cm) 0 Undermi Wound Description Classification: Full Thickness Without Exposed Support Foul Od Structures Slough/ Wound Flat and Intact Margin: Exudate None Present Amount: Wound Bed Granulation Amount: None Present (0%) Necrotic Amount: None Present (0%) Fascia E Fat Laye Tendon E Muscle E Joint Ex Bone Exp or After Cleansing: No Fibrino No Exposed Structure xposed: No r (Subcutaneous Tissue) Exposed: Yes xposed: No xposed: No posed: No osed: No ion in Area: 100% ion in Volume: 100% alization: Large (67-100%) ng: No ning: No Electronic Signature(s) Signed: 03/10/2019 3:43:57 PM By: Mikeal Hawthorne EMT/HBOT Signed: 03/10/2019 5:56:19 PM By: Baruch Gouty RN, BSN Previous Signature: 03/09/2019 6:22:02 PM Version By: Baruch Gouty RN, BSN Entered By: Mikeal Hawthorne on 03/10/2019 14:22:15 -------------------------------------------------------------------------------- Wound Assessment Details Patient Name: Date of Service: [name] 03/09/2019 4:00 PM Medical Record [id number] Patient Account Number: [id number] Date of Birth/Sex: Treating RN: 09/16/1945 (73 y.o. Helene Shoe, Meta.Reding Primary Care Malikah Principato: [name] Other Clinician: Referring Khiem Gargis: Treating Phinneas Shakoor/Extender:Stone III, Lemar Livings, Lendon Ka Weeks in Treatment: 2 Wound Status Wound Number: 4 Primary Venous Leg Ulcer Etiology: Wound Location: Right Lower Leg - Medial Wound Open Wounding Event: Blister Status: Date Acquired: 01/06/2019 Comorbid Cataracts, Congestive Heart Failure, Coronary Weeks Of Treatment: 2 History: Artery Disease, Hypertension, Peripheral Clustered Wound: No Arterial Disease, Peripheral Venous Disease, Type II Diabetes, End Stage Renal Disease, Osteoarthritis, Neuropathy,  Received Radiation Photos Wound Measurements Length: (cm) 4.2 % Reduct Width: (cm) 4 % Reduct Depth: (cm) 0.1 Epitheli Area: (cm) 13.195 Tunneli Volume: (cm) 1.319 Undermi Wound Description Classification: Full Thickness Without Exposed Support Foul Od Structures Slough/ Wound Flat and Intact Margin: Exudate Medium Amount: Exudate Serosanguineous Type: Exudate red, brown Color: Wound Bed Granulation Amount: Medium (34-66%) Granulation Quality: Red, Pink Fascia E Necrotic Amount: Medium (34-66%) Fat Laye Necrotic Quality: Adherent  Slough Tendon E Muscle E Joint Ex Bone Exp or After Cleansing: No Fibrino Yes Exposed Structure xposed: No r (Subcutaneous Tissue) Exposed: Yes xposed: No xposed: No posed: No osed: No ion in Area: 18.8% ion in Volume: 18.9% alization: Small (1-33%) ng: No ning: No Treatment Notes Wound #4 (Right, Medial Lower Leg) 1. Cleanse With Wound Cleanser Soap and water 3. Primary Dressing Applied Hydrogel or K-Y Jelly Santyl Other primary dressing (specifiy in notes) 4. Secondary Dressing Dry Gauze 6. Support Layer Applied Kerlix/Coban Notes adaptic over santyl and hydrogel Electronic Signature(s) Signed: 03/10/2019 3:43:57 PM By: Mikeal Hawthorne EMT/HBOT Signed: 03/10/2019 6:37:14 PM By: Deon Pilling Previous Signature: 03/09/2019 6:36:09 PM Version By: Deon Pilling Entered By: Mikeal Hawthorne on 03/10/2019 14:22:41 -------------------------------------------------------------------------------- Wound Assessment Details Patient Name: Date of Service: [name] 03/09/2019 4:00 PM Medical Record [id number] Patient Account Number: [id number] Date of Birth/Sex: Treating RN: 1945-04-16 (73 y.o. Helene Shoe, Meta.Reding Primary Care Aarvi Stotts: [name] Other Clinician: Referring Corrie Reder: Treating Tabria Steines/Extender:Stone III, Lemar Livings, Lendon Ka Weeks in Treatment: 2 Wound Status Wound Number: 5 Primary Venous  Leg Ulcer Etiology: Wound Location: Left Lower Leg - Medial Wound Open Wounding Event: Blister Status: Date Acquired: 01/06/2019 Comorbid Cataracts, Congestive Heart Failure, Coronary Weeks Of Treatment: 2 History: Artery Disease, Hypertension, Peripheral Clustered Wound: No Arterial Disease, Peripheral Venous Disease, Type II Diabetes, End Stage Renal Disease, Osteoarthritis, Neuropathy, Received Radiation Photos Wound Measurements Length: (cm) 2.5 % Reductio Width: (cm) 2.8 % Reductio Depth: (cm) 0.1 Epithelial Area: (cm) 5.498 Tunneling Volume: (cm) 0.55 Undermini Wound Description Classification: Full Thickness Without Exposed Support Foul Od Structures Slough/ Wound Flat and Intact Margin: Exudate Medium Amount: Exudate Serosanguineous Type: Exudate red, brown Color: Wound Bed Granulation Amount: Small (1-33%) Granulation Quality: Pink Fascia E Necrotic Amount: Large (67-100%) Fat Laye Necrotic Quality: Adherent Slough Tendon E Muscle E Joint Ex Bone Exp or After Cleansing: No Fibrino Yes Exposed Structure xposed: No r (Subcutaneous Tissue) Exposed: Yes xposed: No xposed: No posed: No osed: No n in Area: 54.5% n in Volume: 54.5% ization: Small (1-33%) : No ng: No Treatment Notes Wound #5 (Left, Medial Lower Leg) 1. Cleanse With Wound Cleanser Soap and water 3. Primary Dressing Applied Hydrogel or K-Y Jelly Santyl Other primary dressing (specifiy in notes) 4. Secondary Dressing Dry Gauze 6. Support Layer Applied Kerlix/Coban Notes adaptic over santyl and hydrogel Electronic Signature(s) Signed: 03/10/2019 3:43:57 PM By: Mikeal Hawthorne EMT/HBOT Signed: 03/10/2019 6:37:14 PM By: Deon Pilling Previous Signature: 03/09/2019 6:36:09 PM Version By: Deon Pilling Entered By: Mikeal Hawthorne on 03/10/2019 14:23:06 -------------------------------------------------------------------------------- Wound Assessment Details Patient Name: Date of  Service: [name] 03/09/2019 4:00 PM Medical Record [id number] Patient Account Number: [id number] Date of Birth/Sex: Treating RN: [birth year] (73 y.o. Helene Shoe, Meta.Reding Primary Care Dashia Caldeira: [name] Other Clinician: Referring Taheem Fricke: Treating Dominie Benedick/Extender:Stone III, Lemar Livings, Lendon Ka Weeks in Treatment: 2 Wound Status Wound Number: 6 Primary Venous Leg Ulcer Etiology: Wound Location: Left Lower Leg - Medial, Proximal Wound Open Wounding Event: Blister Status: Date Acquired: 01/06/2019 Comorbid Cataracts, Congestive Heart Failure, Coronary Weeks Of Treatment: 2 History: Artery Disease, Hypertension, Peripheral Clustered Wound: No Arterial Disease, Peripheral Venous Disease, Type II Diabetes, End Stage Renal Disease, Osteoarthritis, Neuropathy, Received Radiation Photos Wound Measurements Length: (cm) 2.2 % Reduct Width: (cm) 2 % Reduct Depth: (cm) 0.1 Epitheli Area: (cm) 3.456 Tunneli Volume: (cm) 0.346 Undermi Wound Description Full Thickness Without Exposed Support Foul Od Classification: Structures Slough/ Wound Flat and Intact Margin: Exudate  Medium Amount: Exudate Serosanguineous Type: Exudate red, brown Color: Wound Bed Granulation Amount: Small (1-33%) Granulation Quality: Red, Pink Fascia E Necrotic Amount: Large (67-100%) Fat Laye Necrotic Quality: Adherent Slough Tendon E Muscle E Joint Ex Bone Exp or After Cleansing: No Fibrino Yes Exposed Structure xposed: No r (Subcutaneous Tissue) Exposed: Yes xposed: No xposed: No posed: No osed: No ion in Area: -633.8% ion in Volume: -636.2% alization: Small (1-33%) ng: No ning: No Treatment Notes Wound #6 (Left, Proximal, Medial Lower Leg) 1. Cleanse With Wound Cleanser Soap and water 3. Primary Dressing Applied Hydrogel or K-Y Jelly Santyl Other primary dressing (specifiy in notes) 4. Secondary Dressing Dry Gauze 6. Support Layer  Applied Kerlix/Coban Notes adaptic over santyl and hydrogel Electronic Signature(s) Signed: 03/10/2019 3:43:57 PM By: Mikeal Hawthorne EMT/HBOT Signed: 03/10/2019 6:37:14 PM By: Deon Pilling Previous Signature: 03/09/2019 6:36:09 PM Version By: Deon Pilling Entered By: Mikeal Hawthorne on 03/10/2019 14:28:34 -------------------------------------------------------------------------------- Wound Assessment Details Patient Name: Date of Service: [name] 03/09/2019 4:00 PM Medical Record [id number] Patient Account Number: [id number] Date of Birth/Sex: Treating RN: December 23, 1945 (73 y.o. [name], Linda Primary Care Cerina Leary: [name] Other Clinician: Referring Cordarro Spinnato: Treating Giani Betzold/Extender:Stone III, Lemar Livings, Lendon Ka Weeks in Treatment: 2 Wound Status Wound Number: 7 Primary Diabetic Wound/Ulcer of the Lower Extremity Etiology: Wound Location: Left Metatarsal head fifth - Lateral Wound Open Wounding Event: Gradually Appeared Status: Date Acquired: 03/09/2019 Comorbid Cataracts, Congestive Heart Failure, Coronary Weeks Of Treatment: 0 History: Artery Disease, Hypertension, Peripheral Clustered Wound: No Arterial Disease, Peripheral Venous Disease, Type II Diabetes, End Stage Renal Disease, Osteoarthritis, Neuropathy, Received Radiation Photos Wound Measurements Length: (cm) 0.4 Width: (cm) 0.3 Depth: (cm) 0.2 Area: (cm) 0.094 Volume: (cm) 0.019 Wound Description Classification: Grade 2 Wound Margin: Well defined, not attached Exudate Amount: Small Exudate Type: Serosanguineous Exudate Color: red, brown Wound Bed Granulation Amount: Large (67-100%) Granulation Quality: Pink, Pale Necrotic Amount: Small (1-33%) Necrotic Quality: Adherent Slough Foul Odor After Cleansing: No Slough/Fibrino No Exposed Structure Fascia Exposed: No Fat Layer (Subcutaneous Tissue) Exposed: Yes Tendon Exposed: No Muscle Exposed: No Joint Exposed:  No Bone Exposed: No % Reduction in Area: 0% % Reduction in Volume: 0% Epithelialization: None Tunneling: No Treatment Notes Wound #7 (Left, Lateral Metatarsal head fifth) 1. Cleanse With Wound Cleanser Soap and water 3. Primary Dressing Applied Calcium Alginate Ag 4. Secondary Dressing Dry Gauze 6. Support Layer Holiday representative) Signed: 03/10/2019 3:43:57 PM By: Mikeal Hawthorne EMT/HBOT Signed: 03/10/2019 5:56:19 PM By: Baruch Gouty RN, BSN Previous Signature: 03/09/2019 6:22:02 PM Version By: Baruch Gouty RN, BSN Entered By: Mikeal Hawthorne on 03/10/2019 14:35:28 -------------------------------------------------------------------------------- [place] Details Patient Name: Date of Service: [name]. 03/09/2019 4:00 PM Medical Record [id number] Patient Account Number: [id number] Date of Birth/Sex: Treating RN: 09-20-1945 (73 y.o. Helene Shoe, Meta.Reding Primary Care Yann Biehn: [name] Other Clinician: Referring Ayaan Ringle: Treating Rayyan Burley/Extender:Stone III, Lemar Livings, Lendon Ka Weeks in Treatment: 2 Vital Signs Time Taken: 16:45 Temperature (F): 98.5 Height (in): 65 Pulse (bpm): 87 Weight (lbs): 162 Respiratory Rate (breaths/min): 20 Body Mass Index (BMI): 27 Blood Pressure (mmHg): 139/57 Capillary Blood Glucose (mg/dl): 69 Reference Range: 80 - 120 mg / dl Notes per patient ate and BG rechecked 199. Electronic Signature(s) Signed: 03/09/2019 6:36:09 PM By: Deon Pilling Entered By: Deon Pilling on 03/09/2019 16:46:39

## 2019-03-16 ENCOUNTER — Other Ambulatory Visit: Payer: Self-pay

## 2019-03-16 ENCOUNTER — Other Ambulatory Visit (HOSPITAL_COMMUNITY)
Admission: RE | Admit: 2019-03-16 | Discharge: 2019-03-16 | Disposition: A | Discharge status: Home / Self Care | Payer: Medicare Other | Source: Other Acute Inpatient Hospital | Attending: Physician Assistant | Admitting: Physician Assistant

## 2019-03-16 ENCOUNTER — Encounter (HOSPITAL_BASED_OUTPATIENT_CLINIC_OR_DEPARTMENT_OTHER): Payer: Medicare Other | Admitting: Physician Assistant

## 2019-03-16 DIAGNOSIS — L97812 Non-pressure chronic ulcer of other part of right lower leg with fat layer exposed: Secondary | ICD-10-CM | POA: Diagnosis not present

## 2019-03-16 DIAGNOSIS — M9903 Segmental and somatic dysfunction of lumbar region: Secondary | ICD-10-CM | POA: Diagnosis not present

## 2019-03-16 DIAGNOSIS — E11621 Type 2 diabetes mellitus with foot ulcer: Secondary | ICD-10-CM | POA: Diagnosis not present

## 2019-03-16 DIAGNOSIS — M5136 Other intervertebral disc degeneration, lumbar region: Secondary | ICD-10-CM | POA: Diagnosis not present

## 2019-03-16 DIAGNOSIS — M40292 Other kyphosis, cervical region: Secondary | ICD-10-CM | POA: Diagnosis not present

## 2019-03-16 DIAGNOSIS — L97822 Non-pressure chronic ulcer of other part of left lower leg with fat layer exposed: Secondary | ICD-10-CM | POA: Diagnosis not present

## 2019-03-16 DIAGNOSIS — L97512 Non-pressure chronic ulcer of 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d: Secondary | ICD-10-CM | POA: Diagnosis not present

## 2019-03-16 DIAGNOSIS — I872 Venous insufficiency (chronic) (peripheral): Secondary | ICD-10-CM | POA: Diagnosis not present

## 2019-03-16 DIAGNOSIS — M9901 Segmental and somatic dysfunction of cervical region: Secondary | ICD-10-CM | POA: Diagnosis not present

## 2019-03-16 NOTE — Progress Notes (Signed)
[name] ([id number]) Visit Report for 02/23/2019 Allergy List Details Patient Name: Date of Service: [name] 02/23/2019 2:45 PM Medical Record [id number] Patient Account Number: [id number] Date of Birth/Sex: Treating RN: December 27, 1945 (73 y.o. [name] Primary Care [name]: [name] Other Clinician: Referring [name]: Treating [name]/Extender:[name], [name], [name] in Treatment: 0 Allergies Active Allergies atenolol Reaction: rash, exacerbates gout Severity: Moderate Iodinated Contrast Media Reaction: Rash Severity: Moderate iohexol Reaction: rash Severity: Moderate latex Reaction: rash Severity: Moderate Allergy Notes Electronic Signature(s) Signed: 02/28/2019 2:18:43 PM By: Pamela Hurst RN, BSN Entered By: [name] on 02/23/2019 15:13:56 -------------------------------------------------------------------------------- Arrival Information Details Patient Name: Date of Service: [name] 02/23/2019 2:45 PM Medical Record [id number] Patient Account Number: [id number] Date of Birth/Sex: Treating RN: 24-Nov-1945 (73 y.o. [name] Primary Care Sarh Kirschenbaum: [name] Other Clinician: Referring Jameka Ivie: Treating [name]/Extender:[name], [name], [name] in Treatment: 0 Visit Information Patient Arrived: Cane Arrival Time: 15:07 Accompanied By: alone Transfer Assistance: None Patient Identification Verified: Yes Secondary Verification Process Yes Completed: Patient Requires Transmission- No Based Precautions: Patient Has Alerts: Yes Patient Alerts: R ABI non compressible L ABI non compressible History Since Last Visit Added or deleted any medications: No Any new allergies or adverse reactions: No Had a fall or experienced change in activities of daily living that may affect risk of falls: No Signs or symptoms of abuse/neglect since last visito  No Hospitalized since last visit: No Implantable device outside of the clinic excluding cellular tissue based products placed in the center since last visit: No Electronic Signature(s) Signed: 02/28/2019 2:18:43 PM By: Pamela Hurst RN, BSN Entered By: [name] on 02/23/2019 15:58:12 -------------------------------------------------------------------------------- Clinic Level of Care Assessment Details Patient Name: Date of Service: [name] 02/23/2019 2:45 PM Medical Record [id number] Patient Account Number: [id number] Date of Birth/Sex: Treating RN: [birth year] (73 y.o. [name] Primary Care Serita Degroote: [name] Other Clinician: Referring [name]: Treating [name]/Extender:[name], [name], [name] in Treatment: 0 Clinic Level of Care Assessment Items TOOL 1 Quantity Score []  - Use when EandM and Procedure is performed on INITIAL visit 0 ASSESSMENTS - Nursing Assessment / Reassessment X - General Physical Exam (combine w/ comprehensive assessment (listed just below) 1 20 when performed on new pt. evals) X - Comprehensive Assessment (HX, ROS, Risk Assessments, Wounds Hx, etc.) 1 25 ASSESSMENTS - Wound and Skin Assessment / Reassessment []  - Dermatologic / Skin Assessment (not related to wound area) 0 ASSESSMENTS - Ostomy and/or Continence Assessment and Care []  - Incontinence Assessment and Management 0 []  - Ostomy Care Assessment and Management (repouching, etc.) 0 PROCESS - Coordination of Care X - Simple Patient / Family Education for ongoing care 1 15 []  - Complex (extensive) Patient / Family Education for ongoing care 0 X - Staff obtains Programmer, systems, Records, Test Results / Process Orders 1 10 []  - Staff telephones HHA, Nursing Homes / Clarify orders / etc 0 []  - Routine Transfer to another Facility (non-emergent condition) 0 []  - Routine Hospital Admission (non-emergent condition) 0 X - New Admissions / Medical laboratory scientific officer / Ordering NPWT, Apligraf, etc. 1 15 []  - Emergency Hospital Admission (emergent condition) 0 PROCESS - Special Needs []  - Pediatric / Minor Patient Management 0 []  - Isolation Patient Management 0 []  - Hearing / Language / Visual special needs 0 []  - Assessment of Community assistance (transportation, D/C planning, etc.) 0 []  - Additional assistance / Altered mentation 0 []  -  Support Surface(s) Assessment (bed, cushion, seat, etc.) 0 INTERVENTIONS - Miscellaneous []  - External ear exam 0 []  - Patient Transfer (multiple staff / Civil Service fast streamer / Similar devices) 0 []  - Simple Staple / Suture removal (25 or less) 0 []  - Complex Staple / Suture removal (26 or more) 0 []  - Hypo/Hyperglycemic Management (do not check if billed separately) 0 X - Ankle / Brachial Index (ABI) - do not check if billed separately 1 15 Has the patient been seen at the hospital within the last three years: Yes Total Score: 100 Level Of Care: New/Established - Level 3 Electronic Signature(s) Signed: 02/23/2019 5:42:21 PM By: Pamela Gouty RN, BSN Entered By: [name] on 02/23/2019 16:25:35 -------------------------------------------------------------------------------- Encounter Discharge Information Details Patient Name: Date of Service: [name] 02/23/2019 2:45 PM Medical Record [id number] Patient Account Number: [id number] Date of Birth/Sex: Treating RN: [birth year] (73 y.o. [name] Primary Care [name]: Other Clinician: Kirtland [name] Referring [name]: Treating [name]/Extender:[name], [name], [name] in Treatment: 0 Encounter Discharge Information Items Post Procedure Vitals Discharge Condition: Stable Temperature (F): 98 Ambulatory Status: Ambulatory Pulse (bpm): 110 Discharge Destination: Home Respiratory Rate (breaths/min): 18 Transportation: Private Auto Blood Pressure (mmHg): 138/63 Accompanied By: self Schedule Follow-up  Appointment: Yes Clinical Summary of Care: Patient Declined Electronic Signature(s) Signed: 03/15/2019 2:50:47 PM By: Pamela Coria RN Entered By: [name] on 02/23/2019 17:21:35 -------------------------------------------------------------------------------- Lower Extremity Assessment Details Patient Name: Date of Service: CHARESE, ABUNDIS 02/23/2019 2:45 PM Medical Record [id number] Patient Account Number: [id number] Date of Birth/Sex: Treating RN: [birth year] (73 y.o. [name] Primary Care Uliana Brinker: [name] Other Clinician: Referring Eppie Barhorst: Treating Urijah Raynor/Extender:[name], [name], [name] in Treatment: 0 Edema Assessment Assessed: [Left: No] [Right: No] Edema: [Left: Yes] [Right: Yes] Calf Left: Right: Point of Measurement: 32 cm From Medial Instep 32.2 cm 33.2 cm Ankle Left: Right: Point of Measurement: 10 cm From Medial Instep 19 cm 19.8 cm Vascular Assessment Pulses: Dorsalis Pedis Palpable: [Left:Yes] [Right:Yes] Electronic Signature(s) Signed: 02/28/2019 2:18:43 PM By: Pamela Hurst RN, BSN Entered By: [name] on 02/23/2019 15:53:34 -------------------------------------------------------------------------------- [place] Details Patient Name: Date of Service: [name] 02/23/2019 2:45 PM Medical Record [id number] Patient Account Number: [id number] Date of Birth/Sex: Treating RN: [birth year] (73 y.o. [name] Primary Care Cachet Mccutchen: [name] Other Clinician: Referring Rashell Shambaugh: Treating Deaysia Grigoryan/Extender:[name], [name], [name] in Treatment: 0 Active Inactive Abuse / Safety / Falls / Self Care Management Nursing Diagnoses: Potential for falls Goals: Patient/caregiver will verbalize/demonstrate measures taken to prevent injury and/or falls Date Initiated: 02/23/2019 Target Resolution Date: 03/23/2019 Goal Status:  Active Interventions: Assess fall risk on admission and as needed Notes: Nutrition Nursing Diagnoses: Impaired glucose control: actual or potential Potential for alteratiion in Nutrition/Potential for imbalanced nutrition Goals: Patient/caregiver will maintain therapeutic glucose control Date Initiated: 02/23/2019 Target Resolution Date: 03/23/2019 Goal Status: Active Interventions: Assess HgA1c results as ordered upon admission and as needed Provide education on elevated blood sugars and impact on wound healing Treatment Activities: Patient referred to Primary Care Physician for further nutritional evaluation : 02/23/2019 Notes: Venous Leg Ulcer Nursing Diagnoses: Knowledge deficit related to disease process and management Potential for venous Insuffiency (use before diagnosis confirmed) Goals: Patient will maintain optimal edema control Date Initiated: 02/23/2019 Target Resolution Date: 03/23/2019 Goal Status: Active Interventions: Compression as ordered Treatment Activities: Therapeutic compression applied : 02/23/2019 Notes: Wound/Skin Impairment Nursing Diagnoses: Impaired tissue integrity Knowledge deficit related to ulceration/compromised skin  integrity Goals: Patient/caregiver will verbalize understanding of skin care regimen Date Initiated: 02/23/2019 Target Resolution Date: 03/23/2019 Goal Status: Active Ulcer/skin breakdown will have a volume reduction of 30% by week 4 Date Initiated: 02/23/2019 Target Resolution Date: 03/23/2019 Goal Status: Active Interventions: Assess patient/caregiver ability to obtain necessary supplies Assess patient/caregiver ability to perform ulcer/skin care regimen upon admission and as needed Assess ulceration(s) every visit Treatment Activities: Skin care regimen initiated : 02/23/2019 Topical wound management initiated : 02/23/2019 Notes: Electronic Signature(s) Signed: 02/23/2019 5:42:21 PM By: Pamela Gouty RN,  BSN Entered By: [name] on 02/23/2019 16:23:46 -------------------------------------------------------------------------------- Pain Assessment Details Patient Name: Date of Service: [name] 02/23/2019 2:45 PM Medical Record [id number] Patient Account Number: [id number] Date of Birth/Sex: Treating RN: [birth year] (73 y.o. [name] Primary Care Brynlea Spindler: [name] Other Clinician: Referring Richad Ramsay: Treating Teigan Manner/Extender:[name], [name], [name] in Treatment: 0 Active Problems Location of Pain Severity and Description of Pain Patient Has Paino No Site Locations Pain Management and Medication Current Pain Management: Electronic Signature(s) Signed: 02/28/2019 2:18:43 PM By: Pamela Hurst RN, BSN Entered By: [name] on 02/23/2019 15:54:13 -------------------------------------------------------------------------------- Patient/Caregiver Education Details Patient Name: Date of Service: [name] 11/18/2020andnbsp2:45 PM Medical Record Patient Account Number: [id number] Number: Treating RN: [name] Date of Birth/Gender: November 25, 1945 (73 y.o. F) Other Clinician: Primary Care Physician: [name] Treating Worthy Keeler Referring Physician: Physician/Extender: Stevphen Meuse in Treatment: 0 Education Assessment Education Provided To: Patient Education Topics Provided Elevated Blood Sugar/ Impact on Healing: Handouts: Elevated Blood Sugars: How Do They Affect Wound Healing Methods: Explain/Verbal, Printed Responses: Reinforcements needed, State content correctly Venous: Handouts: Controlling Swelling with Multilayered Compression Wraps Methods: Explain/Verbal, Printed Responses: Reinforcements needed, State content correctly Welcome To [facility] [place]: Handouts: Welcome To [facility] [place] Methods: Explain/Verbal, Printed Responses: Reinforcements  needed, State content correctly Wound Debridement: Handouts: Wound Debridement Methods: Explain/Verbal, Printed Responses: Reinforcements needed, State content correctly Electronic Signature(s) Signed: 02/23/2019 5:42:21 PM By: Pamela Gouty RN, BSN Entered By: [name] on 02/23/2019 16:25:03 -------------------------------------------------------------------------------- Wound Assessment Details Patient Name: Date of Service: [name] 02/23/2019 2:45 PM Medical Record [id number] Patient Account Number: [id number] Date of Birth/Sex: Treating RN: 1945-05-25 (73 y.o. [name] Primary Care Carlisha Wisler: [name] Other Clinician: Referring Emmely Bittinger: Treating Monchel Pollitt/Extender:[name], [name], [name] in Treatment: 0 Wound Status Wound Number: 2 Primary Diabetic Wound/Ulcer of the Lower Extremity Etiology: Wound Location: Right Foot - Plantar Wound Open Wounding Event: Gradually Appeared Status: Date Acquired: 12/06/2017 Comorbid Cataracts, Congestive Heart Failure, Coronary [name] Of Treatment: 0 History: Artery Disease, Hypertension, Peripheral Clustered Wound: No Arterial Disease, Peripheral Venous Disease, Type II Diabetes, End Stage Renal Disease, Osteoarthritis, Neuropathy, Received Radiation Photos Wound Measurements Length: (cm) 0.8 Width: (cm) 1 Depth: (cm) 0.5 Area: (cm) 0.628 Volume: (cm) 0.314 Wound Description Classification: Grade 2 Wound Margin: Flat and Intact Exudate Amount: Medium Exudate Type: Serosanguineous Exudate Color: red, brown Wound Bed Granulation Amount: Large (67-100%) Granulation Quality: Red Necrotic Amount: Small (1-33%) Necrotic Quality: Adherent Slough After Cleansing: No rino No Exposed Structure sed: No Subcutaneous Tissue) Exposed: Yes sed: No sed: No ed: No d: No % Reduction in Area: 0% % Reduction in Volume: 0% Epithelialization: None Tunneling: No Undermining:  No Foul Odor Slough/Fib Fascia Expo Fat Layer ( Tendon Expo Muscle Expo Joint Expos Bone Expose Electronic Signature(s) Signed: 03/07/2019 4:19:43 PM By: Mikeal Hawthorne EMT/HBOT Signed: 03/16/2019 12:09:06 PM By: Pamela Hurst  RN, BSN Previous Signature: 02/28/2019 2:18:43 PM Version By: Pamela Hurst RN, BSN Entered By: Mikeal Hawthorne on 03/07/2019 11:30:56 -------------------------------------------------------------------------------- Wound Assessment Details Patient Name: Date of Service: NITIKA, JACKOWSKI 02/23/2019 2:45 PM Medical Record [id number] Patient Account Number: [id number] Date of Birth/Sex: Treating RN: 28-Mar-1946 (73 y.o. [name] Primary Care Harshita Bernales: [name] Other Clinician: Referring Van Se[place]: Treating Oneika Simonian/Extender:[name], [name], [name] in Treatment: 0 Wound Status Wound Number: 3 Primary Venous Leg Ulcer Etiology: Wound Location: Right Lower Leg - Anterior Wound Open Wounding Event: Blister Status: Date Acquired: 01/06/2019 Comorbid Cataracts, Congestive Heart Failure, Coronary [name] Of Treatment: 0 History: Artery Disease, Hypertension, Peripheral Clustered Wound: No Arterial Disease, Peripheral Venous Disease, Type II Diabetes, End Stage Renal Disease, Osteoarthritis, Neuropathy, Received Radiation Photos Wound Measurements Length: (cm) 1.5 % Reduct Width: (cm) 1.8 % Reduct Depth: (cm) 0.1 Epitheli Area: (cm) 2.121 Tunneli Volume: (cm) 0.212 Undermi Wound Description Full Thickness Without Exposed Support Foul Od Classification: Structures Slough/ Wound Flat and Intact Margin: Exudate Medium Amount: Exudate Serosanguineous Type: Exudate red, brown Color: Wound Bed Granulation Amount: Large (67-100%) Granulation Quality: Pink Fascia Necrotic Amount: None Present (0%) Fat Lay Tendon Muscle Joint E Bone Ex or After Cleansing: No Fibrino No Exposed Structure Exposed:  No er (Subcutaneous Tissue) Exposed: Yes Exposed: No Exposed: No xposed: No posed: No ion in Area: 0% ion in Volume: 0% alization: None ng: No ning: No Electronic Signature(s) Signed: 03/07/2019 4:19:43 PM By: Mikeal Hawthorne EMT/HBOT Signed: 03/16/2019 12:09:06 PM By: Pamela Hurst RN, BSN Previous Signature: 02/28/2019 2:18:43 PM Version By: Pamela Hurst RN, BSN Entered By: Mikeal Hawthorne on 03/07/2019 11:31:21 -------------------------------------------------------------------------------- Wound Assessment Details Patient Name: Date of Service: [name] 02/23/2019 2:45 PM Medical Record [id number] Patient Account Number: [id number] Date of Birth/Sex: Treating RN: 1945-10-19 (73 y.o. [name] Primary Care Alyria Krack: [name] Other Clinician: Referring Evelyn Aguinaldo: Treating Maurissa Ambrose/Extender:[name], [name], [name] in Treatment: 0 Wound Status Wound Number: 4 Primary Venous Leg Ulcer Etiology: Wound Location: Right Lower Leg - Medial Wound Open Wounding Event: Blister Status: Date Acquired: 01/06/2019 Comorbid Cataracts, Congestive Heart Failure, Coronary [name] Of Treatment: 0 History: Artery Disease, Hypertension, Peripheral Clustered Wound: No Arterial Disease, Peripheral Venous Disease, Type II Diabetes, End Stage Renal Disease, Osteoarthritis, Neuropathy, Received Radiation Photos Wound Measurements Length: (cm) 4.6 % Reduct Width: (cm) 4.5 % Reduct Depth: (cm) 0.1 Epitheli Area: (cm) 16.258 Tunneli Volume: (cm) 1.626 Undermi Wound Description Classification: Full Thickness Without Exposed Support Foul Odo Structures Slough/F Wound Flat and Intact Margin: Exudate Medium Amount: Exudate Serosanguineous Type: Exudate red, brown Color: Wound Bed Granulation Amount: Small (1-33%) Granulation Quality: Pink Fascia E Necrotic Amount: Large (67-100%) Fat Laye Necrotic Quality: Eschar, Adherent Slough  Tendon E Muscle E Joint Ex Bone Exp r After Cleansing: No ibrino Yes Exposed Structure xposed: No r (Subcutaneous Tissue) Exposed: Yes xposed: No xposed: No posed: No osed: No ion in Area: 0% ion in Volume: 0% alization: None ng: No ning: No Electronic Signature(s) Signed: 03/07/2019 4:19:43 PM By: Mikeal Hawthorne EMT/HBOT Signed: 03/16/2019 12:09:06 PM By: Pamela Hurst RN, BSN Previous Signature: 02/28/2019 2:18:43 PM Version By: Pamela Hurst RN, BSN Entered By: Mikeal Hawthorne on 03/07/2019 11:31:47 -------------------------------------------------------------------------------- Wound Assessment Details Patient Name: Date of Service: [name] 02/23/2019 2:45 PM Medical Record [id number] Patient Account Number: [id number] Date of Birth/Sex: Treating RN: [birth year] (73 y.o. [name] Primary Care Mistey Hoffert: [name] Other Clinician: Referring Kaydyn Chism: Treating Tyronica Truxillo/Extender:Pamela  [name], [name], [name] in Treatment: 0 Wound Status Wound Number: 5 Primary Venous Leg Ulcer Etiology: Wound Location: Left Lower Leg - Medial Wound Open Wounding Event: Blister Status: Date Acquired: 01/06/2019 Comorbid Cataracts, Congestive Heart Failure, Coronary [name] Of Treatment: 0 History: Artery Disease, Hypertension, Peripheral Clustered Wound: No Arterial Disease, Peripheral Venous Disease, Type II Diabetes, End Stage Renal Disease, Osteoarthritis, Neuropathy, Received Radiation Photos Wound Measurements Length: (cm) 7 % Reduct Width: (cm) 2.2 % Reduct Depth: (cm) 0.1 Epitheli Area: (cm) 12.095 Tunneli Volume: (cm) 1.21 Undermi Wound Description Classification: Full Thickness Without Exposed Support Structures Wound Flat and Intact Margin: Exudate Medium Amount: Exudate Serosanguineous Type: Exudate red, brown Color: Wound Bed Granulation Amount: Small (1-33%) Granulation Quality: Pink Necrotic Amount: Large  (67-100%) Necrotic Quality: Eschar, Adherent Slough Foul Odor After Cleansing: No Slough/Fibrino Yes Exposed Structure Fascia Exposed: No Fat Layer (Subcutaneous Tissue) Exposed: Yes Tendon Exposed: No Muscle Exposed: No Joint Exposed: No Bone Exposed: No ion in Area: 0% ion in Volume: 0% alization: None ng: No ning: No Electronic Signature(s) Signed: 03/07/2019 4:19:43 PM By: Mikeal Hawthorne EMT/HBOT Signed: 03/16/2019 12:09:06 PM By: Pamela Hurst RN, BSN Previous Signature: 02/28/2019 2:18:43 PM Version By: Pamela Hurst RN, BSN Entered By: Mikeal Hawthorne on 03/07/2019 11:32:11 -------------------------------------------------------------------------------- Wound Assessment Details Patient Name: Date of Service: [name] 02/23/2019 2:45 PM Medical Record [id number] Patient Account Number: [id number] Date of Birth/Sex: Treating RN: [birth year] (73 y.o. [name] Primary Care Burnice Vassel: [name] Other Clinician: Referring Noelly Lasseigne: Treating Semiah Konczal/Extender:[name], [name], [name] in Treatment: 0 Wound Status Wound Number: 6 Primary Venous Leg Ulcer Etiology: Wound Location: Left Lower Leg - Medial, Proximal Wound Open Wounding Event: Blister Status: Date Acquired: 01/06/2019 Comorbid Cataracts, Congestive Heart Failure, Coronary [name] Of Treatment: 0 History: Artery Disease, Hypertension, Peripheral Clustered Wound: No Arterial Disease, Peripheral Venous Disease, Type II Diabetes, End Stage Renal Disease, Osteoarthritis, Neuropathy, Received Radiation Photos Wound Measurements Length: (cm) 1 % Reduct Width: (cm) 0.6 % Reduct Depth: (cm) 0.1 Epitheli Area: (cm) 0.471 Tunneli Volume: (cm) 0.047 Undermi Wound Description Classification: Full Thickness Without Exposed Support Structures Classification: Structures Wound Flat and Intact Margin: Exudate Medium Amount: Exudate Serosanguineous Type: Exudate  red, brown Color: Wound Bed Granulation Amount: Large (67-100%) Granulation Quality: Pink Necrotic Amount: Small (1-33%) Necrotic Quality: Adherent Slough Foul Odor After Cleansing: No Slough/Fibrino No Exposed Structure Fascia Exposed: No Fat Layer (Subcutaneous Tissue) Exposed: Yes Tendon Exposed: No Muscle Exposed: No Joint Exposed: No Bone Exposed: No ion in Area: 0% ion in Volume: 0% alization: None ng: No ning: No Electronic Signature(s) Signed: 03/07/2019 4:19:43 PM By: Mikeal Hawthorne EMT/HBOT Signed: 03/16/2019 12:09:06 PM By: Pamela Hurst RN, BSN Previous Signature: 02/28/2019 2:18:43 PM Version By: Pamela Hurst RN, BSN Entered By: Mikeal Hawthorne on 03/07/2019 11:32:38 -------------------------------------------------------------------------------- [place] Details Patient Name: Date of Service: [name] 02/23/2019 2:45 PM Medical Record [id number] Patient Account Number: [id number] Date of Birth/Sex: Treating RN: 08/04/1945 (73 y.o. [name] Primary Care Artie Takayama: [name] Other Clinician: Referring Nihal Marzella: Treating Destry Dauber/Extender:[name], [name], [name] in Treatment: 0 Vital Signs Time Taken: 15:08 Temperature (F): 98.0 Height (in): 65 Pulse (bpm): 110 Source: Stated Respiratory Rate (breaths/min): 18 Weight (lbs): 162 Blood Pressure (mmHg): 138/63 Source: Stated Capillary Blood Glucose (mg/dl): 120 Body Mass Index (BMI): 27 Reference Range: 80 - 120 mg / dl Electronic Signature(s) Signed: 02/28/2019 2:18:43 PM By: Pamela Hurst RN, BSN Entered By: [name] on 02/23/2019  15:11:39 

## 2019-03-16 NOTE — Progress Notes (Addendum)
[name], [name] ([id number]) Visit Report for 03/16/2019 Allergy List Details Patient Name: Date of Service: [name] 03/16/2019 10:30 AM Medical Record [id number] Patient Account Number: [id number] Date of Birth/Sex: Treating RN: 1945-11-09 (73 y.o. [name] Primary Care Christion Leonhard: [name] Other Clinician: Referring Kelven Flater: Treating Kessler Kopinski/Extender:Stone III, Lemar Livings, Lendon Ka Weeks in Treatment: 3 Allergies Active Allergies atenolol Reaction: rash, exacerbates gout Severity: Moderate Iodinated Contrast Media Reaction: Rash Severity: Moderate iohexol Reaction: rash Severity: Moderate latex Reaction: rash Severity: Moderate Allergy Notes Electronic Signature(s) Signed: 03/16/2019 12:32:42 PM By: Worthy Keeler PA-C Entered By: Worthy Keeler on 03/16/2019 11:05:11 -------------------------------------------------------------------------------- Patient/Caregiver Education Details Patient Name: Date of Service: [name] 12/9/2020andnbsp10:30 AM Medical Record Patient Account Number: [id number] Number: Treating RN: Kela Millin Date of Birth/Gender: 11-03-1945 (73 y.o. Other Clinician: F) Treating Worthy Keeler Primary Care Physician: [name] Physician/Extender: Referring Physician: [name] Referring Physician: Stevphen Meuse in Treatment: 3 Education Assessment Education Provided To: Patient Education Topics Provided Elevated Blood Sugar/ Impact on Healing: Methods: Explain/Verbal Responses: State content correctly Electronic Signature(s) Signed: 03/16/2019 12:04:56 PM By: Kela Millin Entered By: Kela Millin on 03/16/2019 11:44:46 -------------------------------------------------------------------------------- Wound Assessment Details Patient Name: Date of Service: [name] 03/16/2019 10:30 AM Medical Record [id number] Patient Account  Number: [id number] Date of Birth/Sex: Treating RN: Jan 23, 1946 (73 y.o. [name] Primary Care Keegen Heffern: [name] Other Clinician: Referring Perel Hauschild: Treating Marnie Fazzino/Extender:Stone III, Lemar Livings, Lendon Ka Weeks in Treatment: 3 Wound Status Wound Number: 2 Primary Diabetic Wound/Ulcer of the Lower Extremity Etiology: Wound Location: Right Foot - Plantar Wound Open Wounding Event: Gradually Appeared Status: Date Acquired: 12/06/2017 Comorbid Cataracts, Congestive Heart Failure, Coronary Weeks Of Treatment: 3 History: Artery Disease, Hypertension, Peripheral Clustered Wound: No Arterial Disease, Peripheral Venous Disease, Type II Diabetes, End Stage Renal Disease, Osteoarthritis, Neuropathy, Received Radiation Photos Wound Measurements Length: (cm) 0.7 Width: (cm) 0.7 Depth: (cm) 0.8 Area: (cm) 0.385 Volume: (cm) 0.308 Wound Description Classification: Grade 2 Wound Margin: Thickened Exudate Amount: Medium Exudate Type: Serosanguineous Exudate Color: red, brown Wound Bed Granulation Amount: Large (67-100%) Granulation Quality: Red Necrotic Amount: None Present (0%) After Cleansing: No brino No Exposed Structure osed: No (Subcutaneous Tissue) Exposed: Yes osed: No osed: No sed: No ed: No % Reduction in Area: 38.7% % Reduction in Volume: 1.9% Epithelialization: Small (1-33%) Tunneling: No Undermining: No Foul Odor Slough/Fi Fascia Exp Fat Layer Tendon Exp Muscle Exp Joint Expo Bone Expos Electronic Signature(s) Signed: 03/17/2019 3:39:48 PM By: Mikeal Hawthorne EMT/HBOT Signed: 03/17/2019 4:44:46 PM By: Carlene Coria RN Previous Signature: 03/16/2019 12:07:41 PM Version By: Carlene Coria RN Entered By: Mikeal Hawthorne on 03/17/2019 14:06:06 -------------------------------------------------------------------------------- Wound Assessment Details Patient Name: Date of Service: [name] 03/16/2019 10:30 AM Medical Record  [id number] Patient Account Number: [id number] Date of Birth/Sex: Treating RN: [birth year] (73 y.o. [name] Primary Care Wilhelmenia Addis: [name] Other Clinician: Referring Johnathon Mittal: Treating Perlie Stene/Extender:Stone III, Lemar Livings, Lendon Ka Weeks in Treatment: 3 Wound Status Wound Number: 4 Primary Venous Leg Ulcer Etiology: Wound Location: Right Lower Leg - Medial Wound Open Wounding Event: Blister Status: Date Acquired: 01/06/2019 Comorbid Cataracts, Congestive Heart Failure, Coronary Weeks Of Treatment: 3 History: Artery Disease, Hypertension, Peripheral Clustered Wound: No Arterial Disease, Peripheral Venous Disease, Type II Diabetes, End Stage Renal Disease, Osteoarthritis, Neuropathy, Received Radiation Photos Wound Measurements Length: (cm) 4 % Reduct Width: (cm) 4 % Reduct Depth: (cm) 0.1 Epitheli Area: (cm) 12.566 Tunneli Volume: (cm)  1.257 Undermi Wound Description Full Thickness Without Exposed Support Foul Od Classification: Structures Slough/ Wound Flat and Intact Margin: Exudate Medium Amount: Exudate Serosanguineous Type: Exudate red, brown Color: Wound Bed Granulation Amount: Medium (34-66%) Granulation Quality: Red, Pink Fascia Necrotic Amount: Medium (34-66%) Fat Lay Necrotic Quality: Adherent Slough Tendon Muscle Joint E Bone Ex or After Cleansing: No Fibrino Yes Exposed Structure Exposed: No er (Subcutaneous Tissue) Exposed: Yes Exposed: No Exposed: No xposed: No posed: No ion in Area: 22.7% ion in Volume: 22.7% alization: Small (1-33%) ng: No ning: No Electronic Signature(s) Signed: 03/17/2019 3:39:48 PM By: Mikeal Hawthorne EMT/HBOT Signed: 03/17/2019 4:44:46 PM By: Carlene Coria RN Previous Signature: 03/16/2019 12:07:41 PM Version By: Carlene Coria RN Entered By: Mikeal Hawthorne on 03/17/2019 14:06:28 -------------------------------------------------------------------------------- Wound Assessment  Details Patient Name: Date of Service: [name] 03/16/2019 10:30 AM Medical Record [id number] Patient Account Number: [id number] Date of Birth/Sex: Treating RN: [birth year] (73 y.o. [name] Primary Care Neriyah Cercone: [name] Other Clinician: Referring Sandip Power: Treating Deveron Shamoon/Extender:Stone III, Lemar Livings, Lendon Ka Weeks in Treatment: 3 Wound Status Wound Number: 5 Primary Venous Leg Ulcer Etiology: Wound Location: Left Lower Leg - Medial Wound Open Wounding Event: Blister Status: Date Acquired: 01/06/2019 Comorbid Cataracts, Congestive Heart Failure, Coronary Weeks Of Treatment: 3 History: Artery Disease, Hypertension, Peripheral Clustered Wound: No Arterial Disease, Peripheral Venous Disease, Type II Diabetes, End Stage Renal Disease, Osteoarthritis, Neuropathy, Received Radiation Photos Wound Measurements Length: (cm) 2.1 % Reduct Width: (cm) 1.8 % Reduct Depth: (cm) 0.1 Epitheli Area: (cm) 2.969 Tunneli Volume: (cm) 0.297 Undermi Wound Description Full Thickness Without Exposed Support Foul Od Classification: Structures Slough/ Wound Flat and Intact Margin: Exudate Medium Amount: Exudate Serosanguineous Type: Exudate red, brown Color: Wound Bed Granulation Amount: Small (1-33%) Granulation Quality: Pink Fascia E Necrotic Amount: Large (67-100%) Fat Laye Necrotic Quality: Adherent Slough Tendon E Muscle E Joint Ex Bone Exp or After Cleansing: No Fibrino Yes Exposed Structure xposed: No r (Subcutaneous Tissue) Exposed: Yes xposed: No xposed: No posed: No osed: No ion in Area: 75.5% ion in Volume: 75.5% alization: Small (1-33%) ng: No ning: No Electronic Signature(s) Signed: 03/17/2019 3:39:48 PM By: Mikeal Hawthorne EMT/HBOT Signed: 03/17/2019 4:44:46 PM By: Carlene Coria RN Previous Signature: 03/16/2019 12:07:41 PM Version By: Carlene Coria RN Entered By: Mikeal Hawthorne on 03/17/2019  14:10:05 -------------------------------------------------------------------------------- Wound Assessment Details Patient Name: Date of Service: [name] 03/16/2019 10:30 AM Medical Record [id number] Patient Account Number: [id number] Date of Birth/Sex: Treating RN: [birth year] (73 y.o. [name] Primary Care Evangelia Whitaker: [name] Other Clinician: Referring Dayshia Ballinas: Treating Smith Potenza/Extender:Stone III, Lemar Livings, Lendon Ka Weeks in Treatment: 3 Wound Status Wound Number: 6 Primary Venous Leg Ulcer Etiology: Wound Location: Left Lower Leg - Medial, Proximal Wound Open Wounding Event: Blister Status: Date Acquired: 01/06/2019 Comorbid Cataracts, Congestive Heart Failure, Coronary Weeks Of Treatment: 3 History: Artery Disease, Hypertension, Peripheral Clustered Wound: No Arterial Disease, Peripheral Venous Disease, Type II Diabetes, End Stage Renal Disease, Osteoarthritis, Neuropathy, Received Radiation Photos Wound Measurements Length: (cm) 1 % Reduct Width: (cm) 0.7 % Reduct Depth: (cm) 0.1 Epitheli Area: (cm) 0.55 Tunneli Volume: (cm) 0.055 Undermi Wound Description Full Thickness Without Exposed Support Foul Od Classification: Structures Slough/ Wound Flat and Intact Margin: Exudate Medium Amount: Exudate Serosanguineous Type: Exudate red, brown Color: Wound Bed Granulation Amount: Small (1-33%) Granulation Quality: Red, Pink Fascia Necrotic Amount: Large (67-100%) Fat Lay Necrotic Quality: Adherent Slough Tendo Muscl Joint Bone or After Cleansing: No Fibrino Yes Exposed Structure Exposed:  No er (Subcutaneous Tissue) Exposed: Yes n Exposed: No e Exposed: No Exposed: No Exposed: No ion in Area: -16.8% ion in Volume: -17% alization: Small (1-33%) ng: No ning: No Electronic Signature(s) Signed: 03/17/2019 3:39:48 PM By: Mikeal Hawthorne EMT/HBOT Signed: 03/17/2019 4:44:46 PM By: Carlene Coria RN Previous Signature:  03/16/2019 12:07:41 PM Version By: Carlene Coria RN Entered By: Mikeal Hawthorne on 03/17/2019 14:10:27 -------------------------------------------------------------------------------- Wound Assessment Details Patient Name: Date of Service: [name] 03/16/2019 10:30 AM Medical Record [id number] Patient Account Number: [id number] Date of Birth/Sex: Treating RN: 1945-05-02 (73 y.o. [name] Primary Care Sherryann Frese: [name] Other Clinician: Referring Cesilia Shinn: Treating Michalle Rademaker/Extender:Stone III, Lemar Livings, Lendon Ka Weeks in Treatment: 3 Wound Status Wound Number: 7 Primary Diabetic Wound/Ulcer of the Lower Extremity Etiology: Wound Location: Left Metatarsal head fifth - Lateral Wound Open Wounding Event: Gradually Appeared Status: Date Acquired: 03/09/2019 Comorbid Cataracts, Congestive Heart Failure, Coronary Weeks Of Treatment: 1 History: Artery Disease, Hypertension, Peripheral Clustered Wound: No Arterial Disease, Peripheral Venous Disease, Type II Diabetes, End Stage Renal Disease, Osteoarthritis, Neuropathy, Received Radiation Wound Measurements Length: (cm) 0.5 Width: (cm) 0.6 Depth: (cm) 0.9 Area: (cm) 0.236 Volume: (cm) 0.212 % Reduction in Area: -151.1% % Reduction in Volume: -1015.8% Epithelialization: None Tunneling: No Undermining: Yes Starting Position (o'clock): 12 Ending Position (o'clock): 12 Maximum Distance: (cm) 2.5 Wound Description Classification: Grade 2 Foul Wound Margin: Well defined, not attached Sloug Exudate Amount: Small Exudate Type: Serosanguineous Exudate Color: red, brown Wound Bed Granulation Amount: Large (67-100%) Granulation Quality: Pink, Pale Fascia Expo Necrotic Amount: Small (1-33%) Fat Layer ( Necrotic Quality: Adherent Slough Tendon Expo Muscle Expo Joint Expos Bone Expose Odor After Cleansing: No h/Fibrino No Exposed Structure sed: No Subcutaneous Tissue) Exposed: Yes sed: No sed:  No ed: No d: No Electronic Signature(s) Signed: 03/16/2019 12:07:41 PM By: Carlene Coria RN Entered By: Carlene Coria on 03/16/2019 11:00:42 -------------------------------------------------------------------------------- Vitals Details Patient Name: Date of Service: [name]. 03/16/2019 10:30 AM Medical Record [id number] Patient Account Number: [id number] Date of Birth/Sex: Treating RN: Aug 29, 1945 (73 y.o. [name] Primary Care Sueo Cullen: [name] Other Clinician: Referring Dorthia Tout: Treating Deseray Daponte/Extender:Stone III, Lemar Livings, Lendon Ka Weeks in Treatment: 3 Vital Signs Time Taken: 10:37 Temperature (F): 98.2 Height (in): 65 Pulse (bpm): 82 Weight (lbs): 162 Respiratory Rate (breaths/min): 18 Body Mass Index (BMI): 27 Blood Pressure (mmHg): 145/49 Reference Range: 80 - 120 mg / dl Electronic Signature(s) Signed: 03/16/2019 12:07:41 PM By: Carlene Coria RN Entered By: Carlene Coria on 03/16/2019 10:37:39

## 2019-03-16 NOTE — Progress Notes (Addendum)
[name], [name] ([id number]) Visit Report for 03/16/2019 Debridement Details Patient Name: Date of Service: [name], [name] 03/16/2019 10:30 AM Medical Record [id number] Patient Account Number: [id number] Date of Birth/Sex: Treating RN: [birth year] (73 y.o. [name] Primary Care Provider: [name] Other Clinician: Referring Provider: Treating Provider/Extender:Stone III, Lemar Livings, Lendon Ka Weeks in Treatment: 3 Debridement Performed for Wound #4 Right,Medial Lower Leg Assessment: Performed By: Clinician [name], RN Debridement Type: Chemical/Enzymatic/Mechanical Agent Used: Santyl Severity of Tissue Pre Fat layer exposed Debridement: Level of Consciousness (Pre- Awake and Alert procedure): Pre-procedure Verification/Time Out Taken: No Start Time: 11:35 Bleeding: None End Time: 11:35 Procedural Pain: 0 Post Procedural Pain: 0 Response to Treatment: Procedure was tolerated well Level of Consciousness Awake and Alert (Post-procedure): Post Debridement Measurements of Total Wound Length: (cm) 4 Width: (cm) 4 Depth: (cm) 0.1 Volume: (cm) 1.257 Character of Wound/Ulcer Post Requires Further Debridement Debridement: Severity of Tissue Post Debridement: Fat layer exposed Post Procedure Diagnosis Same as Pre-procedure Electronic Signature(s) Signed: 03/16/2019 12:32:42 PM By: Worthy Keeler PA-C Signed: 03/16/2019 4:59:21 PM By: [name] RN, BSN Entered By: [name] on 03/16/2019 11:38:39 -------------------------------------------------------------------------------- Debridement Details Patient Name: Date of Service: [name] 03/16/2019 10:30 AM Medical Record [id number] Patient Account Number: [id number] Date of Birth/Sex: Treating RN: 05-Nov-1945 (73 y.o. [name] Primary Care Provider: [name] Other Clinician: Referring Provider: Treating Provider/Extender:Stone III, Lemar Livings,  Lendon Ka Weeks in Treatment: 3 Debridement Performed for Wound #5 Left,Medial Lower Leg Assessment: Performed By: Clinician [name], RN Debridement Type: Chemical/Enzymatic/Mechanical Agent Used: Santyl Severity of Tissue Pre Fat layer exposed Debridement: Level of Consciousness (Pre- Awake and Alert procedure): Pre-procedure No Verification/Time Out Taken: Start Time: 11:35 Bleeding: None End Time: 11:35 Procedural Pain: 0 Post Procedural Pain: 0 Response to Treatment: Procedure was tolerated well Level of Consciousness Awake and Alert (Post-procedure): Post Debridement Measurements of Total Wound Length: (cm) 2.1 Width: (cm) 1.8 Depth: (cm) 0.1 Volume: (cm) 0.297 Character of Wound/Ulcer Post Requires Further Debridement Debridement: Severity of Tissue Post Debridement: Fat layer exposed Post Procedure Diagnosis Same as Pre-procedure Electronic Signature(s) Signed: 03/16/2019 12:32:42 PM By: Worthy Keeler PA-C Signed: 03/16/2019 4:59:21 PM By: [name] RN, BSN Entered By: [name] on 03/16/2019 11:39:05 -------------------------------------------------------------------------------- Debridement Details Patient Name: Date of Service: [name] 03/16/2019 10:30 AM Medical Record [id number] Patient Account Number: [id number] Date of Birth/Sex: Treating RN: 1945/08/04 (73 y.o. [name] Primary Care Provider: [name] Other Clinician: Referring Provider: Treating Provider/Extender:Stone III, Lemar Livings, Lendon Ka Weeks in Treatment: 3 Debridement Performed for Wound #6 Left,Proximal,Medial Lower Leg Assessment: Performed By: Clinician [name], RN Debridement Type: Chemical/Enzymatic/Mechanical Agent Used: Santyl Severity of Tissue Pre Fat layer exposed Debridement: Level of Consciousness (Pre- Awake and Alert procedure): Pre-procedure No Verification/Time Out Taken: Start Time: 11:35 Bleeding:  None End Time: 11:35 Procedural Pain: 0 Post Procedural Pain: 0 Response to Treatment: Procedure was tolerated well Level of Consciousness Awake and Alert (Post-procedure): Post Debridement Measurements of Total Wound Length: (cm) 1 Width: (cm) 0.7 Depth: (cm) 0.1 Volume: (cm) 0.055 Character of Wound/Ulcer Post Requires Further Debridement Debridement: Severity of Tissue Post Debridement: Fat layer exposed Post Procedure Diagnosis Same as Pre-procedure Electronic Signature(s) Signed: 03/16/2019 12:32:42 PM By: Worthy Keeler PA-C Signed: 03/16/2019 4:59:21 PM By: [name] RN, BSN Entered By: [name] on 03/16/2019 11:39:34 -------------------------------------------------------------------------------- HPI Details Patient Name: Date of Service: [name] 03/16/2019 10:30 AM Medical Record [id number] Patient  Account Number: [id number] Date of Birth/Sex: Treating RN: 10/29/1945 (73 y.o. [name] Primary Care Provider: [name] Other Clinician: Referring Provider: Treating Provider/Extender:Stone III, Lemar Livings, Lendon Ka Weeks in Treatment: 3 History of Present Illness HPI Description: 02/23/2019 on evaluation today patient presents for initial evaluation in our clinic concerning issues that she has been having with her bilateral lower extremities as well as her plantar foot on the right. This has been quite some time in regard to the foot ulcer even back as far as the beginning of this year that is 2020. She has had the wounds on her legs a much shorter time but that still has been roughly 2-3 months. Nonetheless she is really not shown signs of a lot of improvement. She tells me that she does have a past medical history of diabetes, venous insufficiency, lymphedema which is mild but nonetheless present, peripheral vascular disease and she has previously been recommended to have a femoral-popliteal bypass but put that off at that  point they are basically just monitoring at this time. She has end-stage renal disease with dependence on renal dialysis, hypertension, and obviously wounds of the bilateral lower extremities at this point. She tells me that she is having some discomfort but fortunately nothing too significant at this point which is good news. No fevers, chills, nausea, vomiting, or diarrhea. She is mainly been leaving these areas open to air as much as possible at this point at 1 point she was trying to keep them covered more but that really did not help either. Fortunately there is no signs of active systemic nor local infection at this time. 03/02/2019 on evaluation today patient appears to be doing well with regard to her wounds. The leg ulcers which were very dry are much more moist at this point and seem to be doing great at this time. Overall I am very pleased with how things seem to be progressing. With regard to her foot ulcer it is showing some signs of slight improvement in my opinion overall there does not appear to be anything worse at least which is also good news. No fevers, chills, nausea, vomiting, or diarrhea. 03/09/2019 on evaluation today patient appears to be doing better with regard to her lower extremity ulcers a lot of the necrotic tissue is loosening up which is good news. In regard to her right plantar foot ulcer this appears to be roughly the same based on what I am seeing currently although there is less callus buildup. Unfortunately she does have an open wound area on the left lateral foot region which unfortunately probes down to bone once this was thoroughly examined. This is something she did not even know was opened she had had a Band-Aid over it just for protection last couple times I saw her. 03/16/19 evaluation today patient appears to be doing Someone poorly in regard to her left lower extremity. This seems to be likely infected based on what Im seeing at this point. Theres no  signs of active infection at this time systemically but nonetheless theres a lot more erythema of the foot in general unfortunately. Her x-rays were reviewed and did not show any evidence of infection of the right foot. On the left there was a A little bit more going on but no obvious evidence of osteomyelitis though it was mentioned that if that was still a concern that number I would be the best way to further evaluate this. I think they were going to need  to proceed with MRI to be honest based on what Im seeing today. Electronic Signature(s) Signed: 03/21/2019 10:24:00 AM By: Worthy Keeler PA-C Signed: 03/21/2019 6:02:21 PM By: [name] RN, BSN Previous Signature: 03/16/2019 12:32:42 PM Version By: Worthy Keeler PA-C Entered By: [name] on 03/18/2019 08:27:46 -------------------------------------------------------------------------------- Physical Exam Details Patient Name: Date of Service: [name] 03/16/2019 10:30 AM Medical Record [id number] Patient Account Number: [id number] Date of Birth/Sex: Treating RN: [birth year] (73 y.o. [name] Primary Care Provider: [name] Other Clinician: Referring Provider: Treating Provider/Extender:Stone III, Lemar Livings, Lendon Ka Weeks in Treatment: 3 Constitutional Well-nourished and well-hydrated in no acute distress. Respiratory normal breathing without difficulty. Psychiatric this patient is able to make decisions and demonstrates good insight into disease process. Alert and Oriented x 3. pleasant and cooperative. Notes Patients wounds on the leg seem to be doing OK in her right foot also seems to be doing OK. With regard to the left foot ulcer this actually doesnt appear to be doing as well theres still a lot of depth and undermining and to be honest the patient also still is having issues with your theme of the foot which seems to be a little bit worse today compared to previous. There is  definite cellulitis noted here. Electronic Signature(s) Signed: 03/16/2019 12:32:42 PM By: Worthy Keeler PA-C Entered By: Worthy Keeler on 03/16/2019 11:42:01 -------------------------------------------------------------------------------- Physician Orders Details Patient Name: Date of Service: [name] 03/16/2019 10:30 AM Medical Record [id number] Patient Account Number: [id number] Date of Birth/Sex: Treating RN: 04-17-1945 (73 y.o. 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 Dressing Change Frequency Wound #2 Right,Plantar Foot Do not change entire dressing for one week. Wound #4 Right,Medial Lower Leg Do not change entire dressing for one week. Wound #5 Left,Medial Lower Leg Do not change entire dressing for one week. Wound #6 Left,Proximal,Medial Lower Leg Do not change entire dressing for one week. Skin Barriers/Peri-Wound Care Moisturizing lotion - both legs Wound Cleansing May shower with protection. Primary Wound  Dressing Wound #2 Right,Plantar Foot Calcium Alginate with Silver - pack lightly into wound Wound #4 Right,Medial Lower Leg Santyl Ointment Hydrogel - thin layer over santyl covered with adaptic Wound #5 Left,Medial Lower Leg Santyl Ointment Hydrogel - thin layer over santyl covered with adaptic Wound #6 Left,Proximal,Medial Lower Leg Santyl Ointment Hydrogel - thin layer over santyl covered with adaptic Wound #7 Left,Lateral Metatarsal head fifth Calcium Alginate with Silver - pack lightly into wound Secondary Dressing Wound #2 Right,Plantar Foot Foam - foam donut Kerlix/Rolled Gauze Dry Gauze Drawtex - cut to fit inside wound edges to hold collagen in place Wound #4 Right,Medial Lower Leg Dry Gauze ABD pad Wound #5 Left,Medial Lower  Leg Dry Gauze ABD pad Wound #6 Left,Proximal,Medial Lower Leg Dry Gauze ABD pad Edema Control Kerlix and Coban - Bilateral Avoid standing for long periods of time Elevate legs to the level of the heart or above for 30 minutes daily and/or when sitting, a frequency of: - whenever sitting Exercise regularly Laboratory Bacteria identified in Unspecified specimen by Anaerobe culture (MICRO) - Left lateral foot - (ICD10 E11.621 - Type 2 diabetes mellitus with foot ulcer) LOINC Code: 635-3 Convenience Name: Sebastian culture Radiology MRI, left foot without contrast - Non healing ulcer on left lateral foot, rule out Osteomyelitis - (ICD10 L97.526 - 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 of necrosis) Electronic Signature(s) Signed: 03/16/2019 4:00:40 PM By: Worthy Keeler PA-C Signed: 03/16/2019 4:59:21 PM By: [name] RN, BSN Previous Signature: 03/16/2019 12:32:42 PM Version By: Worthy Keeler PA-C Entered By: [name] on 03/16/2019 13:04:53 -------------------------------------------------------------------------------- Prescription 03/16/2019 Patient Name: [name] Provider: Worthy Keeler PA Date of Birth: 1946-01-22 NPI#: [id number] Sex: F DEA#: [id number] Phone #: [phone] License #: Patient Address: [name], [place] [address] [address] [place], [place] [address] [phone] Allergies atenolol Reaction: rash, exacerbates gout Severity: Moderate Iodinated Contrast Media Reaction: Rash Severity: Moderate iohexol Reaction: rash Severity: Moderate latex Reaction: rash Severity: Moderate Provider's Orders MRI, left foot without contrast - ICD10: L97.526 - Non healing ulcer on left lateral foot, rule out Osteomyelitis Signature(s): Date(s): Electronic Signature(s) Signed: 03/16/2019 4:00:40 PM By: Worthy Keeler PA-C Signed: 03/16/2019 4:59:21 PM By: [name] RN, BSN Entered By: [name] on 03/16/2019 13:04:55 --------------------------------------------------------------------------------  Problem List Details Patient Name: Date of Service: [name] 03/16/2019 10:30 AM Medical Record [id number] Patient Account Number: [id number] Date of Birth/Sex: Treating RN: [birth year] (73 y.o. [name] Primary Care Provider: [name] Other Clinician: Referring Provider: Treating Provider/Extender:Stone III, Lemar Livings, Lendon Ka Weeks in Treatment: 3 Active Problems ICD-10 Evaluated Encounter Code Description Active Date Today Diagnosis E11.621 Type 2 diabetes mellitus with foot ulcer 02/23/2019 No Yes L97.512 Non-pressure chronic ulcer of other part of right foot 02/23/2019 No Yes with fat layer exposed L97.526 Non-pressure chronic ulcer of other part of left foot 03/09/2019 No Yes with bone involvement without evidence of necrosis I87.2 Venous insufficiency (chronic) (peripheral) 02/23/2019 No Yes I89.0 Lymphedema, not elsewhere classified 02/23/2019 No Yes L97.812 Non-pressure chronic ulcer of other  part of right lower 02/23/2019 No Yes leg with fat layer exposed L97.822 Non-pressure chronic ulcer of other part of left lower 02/23/2019 No Yes leg with fat layer exposed I73.89 Other specified peripheral vascular diseases 02/23/2019 No Yes N18.6 End stage renal disease 02/23/2019 No Yes Z99.2 Dependence on renal dialysis 02/23/2019 No Yes I10 Essential (primary) hypertension 02/23/2019 No Yes Inactive Problems Resolved Problems Electronic Signature(s) Signed: 03/16/2019 12:32:42 PM By: Worthy Keeler PA-C Entered By: Worthy Keeler on 03/16/2019 11:05:51 -------------------------------------------------------------------------------- Progress Note Details Patient Name: Date of Service: [name] 03/16/2019 10:30 AM Medical Record [id number] Patient Account Number: [id number] Date of Birth/Sex: Treating RN: [birth year] (73 y.o. [name] Primary Care Provider: [name] Other Clinician: Referring Provider: Treating Provider/Extender:Stone III, Lemar Livings, Lendon Ka Weeks in Treatment: 3 Subjective History of Present Illness (HPI) 02/23/2019 on evaluation today patient presents for initial evaluation in our clinic concerning issues that she has been having with her bilateral lower extremities as well as  her plantar foot on the right. This has been quite some time in regard to the foot ulcer even back as far as the beginning of this year that is 2020. She has had the wounds on her legs a much shorter time but that still has been roughly 2-3 months. Nonetheless she is really not shown signs of a lot of improvement. She tells me that she does have a past medical history of diabetes, venous insufficiency, lymphedema which is mild but nonetheless present, peripheral vascular disease and she has previously been recommended to have a femoral-popliteal bypass but put that off at that point they are basically just monitoring at this time. She has end-stage  renal disease with dependence on renal dialysis, hypertension, and obviously wounds of the bilateral lower extremities at this point. She tells me that she is having some discomfort but fortunately nothing too significant at this point which is good news. No fevers, chills, nausea, vomiting, or diarrhea. She is mainly been leaving these areas open to air as much as possible at this point at 1 point she was trying to keep them covered more but that really did not help either. Fortunately there is no signs of active systemic nor local infection at this time. 03/02/2019 on evaluation today patient appears to be doing well with regard to her wounds. The leg ulcers which were very dry are much more moist at this point and seem to be doing great at this time. Overall I am very pleased with how things seem to be progressing. With regard to her foot ulcer it is showing some signs of slight improvement in my opinion overall there does not appear to be anything worse at least which is also good news. No fevers, chills, nausea, vomiting, or diarrhea. 03/09/2019 on evaluation today patient appears to be doing better with regard to her lower extremity ulcers a lot of the necrotic tissue is loosening up which is good news. In regard to her right plantar foot ulcer this appears to be roughly the same based on what I am seeing currently although there is less callus buildup. Unfortunately she does have an open wound area on the left lateral foot region which unfortunately probes down to bone once this was thoroughly examined. This is something she did not even know was opened she had had a Band-Aid over it just for protection last couple times I saw her. 03/16/19 evaluation today patient appears to be doing Someone poorly in regard to her left lower extremity. This seems to be likely infected based on what Ioom seeing at this point. Thereoos no signs of active infection at this time systemically but nonetheless  thereoos a lot more erythema of the foot in general unfortunately. Her x-rays were reviewed and did not show any evidence of infection of the right foot. On the left there was a A little bit more going on but no obvious evidence of osteomyelitis though it was mentioned that if that was still a concern that number I would be the best way to further evaluate this. I think they were going to need to proceed with MRI to be honest based on what Ioom seeing today. Patient History Information obtained from Patient. Allergies atenolol (Severity: Moderate, Reaction: rash, exacerbates gout), Iodinated Contrast Media (Severity: Moderate, Reaction: Rash), iohexol (Severity: Moderate, Reaction: rash), latex (Severity: Moderate, Reaction: rash) Family History Cancer - Father,Siblings, Diabetes - Maternal Grandparents,Paternal Grandparents,Mother,Father,Siblings, Heart Disease - Maternal Grandparents,Paternal Grandparents,Mother,Father,Siblings, Hypertension - Maternal Grandparents,Paternal Grandparents, Kidney Disease -  Siblings, No family history of Hereditary Spherocytosis, Lung Disease, Seizures, Stroke, Thyroid Problems, Tuberculosis. Social History Never smoker, Marital Status - Married, Alcohol Use - Never, Drug Use - No History, Caffeine Use - Rarely. Medical History Eyes Patient has history of Cataracts - removed Cardiovascular Patient has history of Congestive Heart Failure, Coronary Artery Disease, Hypertension, Peripheral Arterial Disease, Peripheral Venous Disease Endocrine Patient has history of Type II Diabetes Genitourinary Patient has history of End Stage Renal Disease - Hemodialysis 3x/week Integumentary (Skin) Denies history of History of Burn Musculoskeletal Patient has history of Osteoarthritis Neurologic Patient has history of Neuropathy Oncologic Patient has history of Received Radiation Medical And Surgical History Notes Cardiovascular Hyperlipidemia,  Cardiomyopathy Oncologic Breast cancer 2014 with lumpectomy Review of Systems (ROS) Constitutional Symptoms (General Health) Denies complaints or symptoms of Fatigue, Fever, Chills, Marked Weight Change. Eyes Denies complaints or symptoms of Dry Eyes, Vision Changes, Glasses / Contacts. Ear/Nose/Mouth/Throat Denies complaints or symptoms of Chronic sinus problems or rhinitis. Respiratory Denies complaints or symptoms of Chronic or frequent coughs, Shortness of Breath. Cardiovascular Denies complaints or symptoms of Chest pain. Gastrointestinal Denies complaints or symptoms of Frequent diarrhea, Nausea, Vomiting. Endocrine Denies complaints or symptoms of Heat/cold intolerance. Genitourinary Denies complaints or symptoms of Frequent urination. Integumentary (Skin) Complains or has symptoms of Wounds. Musculoskeletal Denies complaints or symptoms of Muscle Pain, Muscle Weakness. Neurologic Denies complaints or symptoms of Numbness/parasthesias. Psychiatric Denies complaints or symptoms of Claustrophobia, Suicidal. Objective Constitutional Well-nourished and well-hydrated in no acute distress. Vitals Time Taken: 10:37 AM, Height: 65 in, Weight: 162 lbs, BMI: 27, Temperature: 98.2 F, Pulse: 82 bpm, Respiratory Rate: 18 breaths/min, Blood Pressure: 145/49 mmHg. Respiratory normal breathing without difficulty. Psychiatric this patient is able to make decisions and demonstrates good insight into disease process. Alert and Oriented x 3. pleasant and cooperative. General Notes: Patientoos wounds on the leg seem to be doing OK in her right foot also seems to be doing OK. With regard to the left foot ulcer this actually doesnoot appear to be doing as well thereoos still a lot of depth and undermining and to be honest the patient also still is having issues with your theme of the foot which seems to be a little bit worse today compared to previous. There is definite cellulitis noted  here. Integumentary (Hair, Skin) Wound #2 status is Open. Original cause of wound was Gradually Appeared. The wound is located on the [place]. The wound measures 0.7cm length x 0.7cm width x 0.8cm depth; 0.385cm^2 area and 0.308cm^3 volume. There is Fat Layer (Subcutaneous Tissue) Exposed exposed. There is no tunneling or undermining noted. There is a medium amount of serosanguineous drainage noted. The wound margin is thickened. There is large (67-100%) red granulation within the wound bed. There is no necrotic tissue within the wound bed. Wound #4 status is Open. Original cause of wound was Blister. The wound is located on the Right,Medial Lower Leg. The wound measures 4cm length x 4cm width x 0.1cm depth; 12.566cm^2 area and 1.257cm^3 volume. There is Fat Layer (Subcutaneous Tissue) Exposed exposed. There is no tunneling or undermining noted. There is a medium amount of serosanguineous drainage noted. The wound margin is flat and intact. There is medium (34-66%) red, pink granulation within the wound bed. There is a medium (34-66%) amount of necrotic tissue within the wound bed including Adherent Slough. Wound #5 status is Open. Original cause of wound was Blister. The wound is located on the Left,Medial Lower Leg. The wound measures 2.1cm length  x 1.8cm width x 0.1cm depth; 2.969cm^2 area and 0.297cm^3 volume. There is Fat Layer (Subcutaneous Tissue) Exposed exposed. There is no tunneling or undermining noted. There is a medium amount of serosanguineous drainage noted. The wound margin is flat and intact. There is small (1-33%) pink granulation within the wound bed. There is a large (67-100%) amount of necrotic tissue within the wound bed including Adherent Slough. Wound #6 status is Open. Original cause of wound was Blister. The wound is located on the Left,Proximal,Medial Lower Leg. The wound measures 1cm length x 0.7cm width x 0.1cm depth; 0.55cm^2 area and 0.055cm^3  volume. There is Fat Layer (Subcutaneous Tissue) Exposed exposed. There is no tunneling or undermining noted. There is a medium amount of serosanguineous drainage noted. The wound margin is flat and intact. There is small (1-33%) red, pink granulation within the wound bed. There is a large (67-100%) amount of necrotic tissue within the wound bed including Adherent Slough. Wound #7 status is Open. Original cause of wound was Gradually Appeared. The wound is located on the Left,Lateral Metatarsal head fifth. The wound measures 0.5cm length x 0.6cm width x 0.9cm depth; 0.236cm^2 area and 0.212cm^3 volume. There is Fat Layer (Subcutaneous Tissue) Exposed exposed. There is no tunneling noted, however, there is undermining starting at 12:00 and ending at 12:00 with a maximum distance of 2.5cm. There is a small amount of serosanguineous drainage noted. The wound margin is well defined and not attached to the wound base. There is large (67-100%) pink, pale granulation within the wound bed. There is a small (1-33%) amount of necrotic tissue within the wound bed including Adherent Slough. 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 There was a  Chemical/Enzymatic/Mechanical debridement performed by [name], RN.Marland Kitchen Agent used was [employer]. There was no bleeding. The procedure was tolerated well with a pain level of 0 throughout and a pain level of 0 following the procedure. Post Debridement Measurements: 4cm length x 4cm width x 0.1cm depth; 1.257cm^3 volume. Character of Wound/Ulcer Post Debridement requires further debridement. Severity of Tissue Post Debridement is: Fat layer exposed. Post procedure Diagnosis Wound #4: Same as Pre-Procedure Wound #5 Pre-procedure diagnosis of Wound #5 is a Venous Leg Ulcer located on the Left,Medial Lower Leg .Severity of Tissue Pre Debridement is: Fat layer exposed. There was a Chemical/Enzymatic/Mechanical debridement performed by [name], RN.Marland Kitchen Agent used was [employer]. There was no bleeding. The procedure was tolerated well with a pain level of 0 throughout and a pain level of 0 following the procedure. Post Debridement Measurements: 2.1cm length x 1.8cm width x 0.1cm depth; 0.297cm^3 volume. Character of Wound/Ulcer Post Debridement requires further debridement. Severity of Tissue Post Debridement is: Fat layer exposed. Post procedure Diagnosis Wound #5: Same as Pre-Procedure Wound #6 Pre-procedure diagnosis of Wound #6 is a Venous Leg Ulcer located on the Left,Proximal,Medial Lower Leg .  Severity of Tissue Pre Debridement is: Fat layer exposed. There was a Chemical/Enzymatic/Mechanical debridement performed by [name], RN.Marland Kitchen Agent used was [employer]. There was no bleeding. The procedure was tolerated well with a pain level of 0 throughout and a pain level of 0 following the procedure. Post Debridement Measurements: 1cm length x 0.7cm width x 0.1cm depth; 0.055cm^3 volume. Character of Wound/Ulcer Post Debridement requires further debridement. Severity of Tissue Post Debridement is: Fat layer exposed. Post procedure Diagnosis Wound #6: Same as Pre-Procedure Plan Follow-up  Appointments: Return Appointment in 1 week. Dressing Change Frequency: Wound #2 Right,Plantar Foot: Do not change entire dressing for one week. Wound #4 Right,Medial Lower Leg: Do not change entire dressing for one week. Wound #5 Left,Medial Lower Leg: Do not change entire dressing for one week. Wound #6 Left,Proximal,Medial Lower Leg: Do not change entire dressing for one week. Skin Barriers/Peri-Wound Care: Moisturizing lotion - both legs Wound Cleansing: May shower with protection. Primary Wound Dressing: Wound #2 Right,Plantar Foot: Calcium Alginate with Silver - pack lightly into wound Wound #4 Right,Medial Lower Leg: Santyl Ointment Hydrogel - thin layer over santyl covered with adaptic Wound #5 Left,Medial Lower Leg: Santyl Ointment Hydrogel - thin layer over santyl covered with adaptic Wound #6 Left,Proximal,Medial Lower Leg: Santyl Ointment Hydrogel - thin layer over santyl covered with adaptic Wound #7 Left,Lateral Metatarsal head fifth: Calcium Alginate with Silver - pack lightly into wound Secondary Dressing: Wound #2 Right,Plantar Foot: Foam - foam donut Kerlix/Rolled Gauze Dry Gauze Drawtex - cut to fit inside wound edges to hold collagen in place Wound #4 Right,Medial Lower Leg: Dry Gauze ABD pad Wound #5 Left,Medial Lower Leg: Dry Gauze ABD pad Wound #6 Left,Proximal,Medial Lower Leg: Dry Gauze ABD pad Edema Control: Kerlix and Coban - Bilateral Avoid standing for long periods of time Elevate legs to the level of the heart or above for 30 minutes daily and/or when sitting, a frequency of: - whenever sitting Exercise regularly Laboratory ordered were: Anerobic culture - Left lateral foot Radiology ordered were: MRI, left foot without contrast - Non healing ulcer on left lateral foot, rule out Osteomyelitis Based on what I'm seeing at this point I do believe that the patient would benefit from proceeding with the MRI for the left foot to evaluate  for osteomyelitis. This definitely probe down to bone. We are going to obtain a wound culture today as well based on the results of the culture we may have to alter anabiotic therapy. With that being said my plan was to go ahead and put her on something today. She really would like to be on IV antibiotics administered at dialysis. For that reason we're gonna have to check with her physician there and see if they'll be willing to initiate and write the order for this I think vancomycin may be a good choice or possibly cefepime. With that being said I'm not able to write the order as I'm working remotely today due to a pending COVID-19 test. We were there for a contact her nephrologist and see if they're willing to go ahead and initiate this currently. Patients in agreement with the plan. We will otherwise continue with the current worker measures per the orders above. Hopefully the MRI will not take too long to schedule. I will see the patient back for a follow up visit in one weeks time. Electronic Signature(s) Signed: 03/21/2019 10:24:00 AM By: Worthy Keeler PA-C Signed: 03/21/2019 6:02:21 PM By: [name] RN, BSN Previous Signature: 03/16/2019 12:32:42 PM  Version By: Worthy Keeler PA-C Entered By: [name] on 03/18/2019 08:27:57 -------------------------------------------------------------------------------- HxROS Details Patient Name: Date of Service: [name], [name] 03/16/2019 10:30 AM Medical Record [id number] Patient Account Number: [id number] Date of Birth/Sex: Treating RN: 06/13/1945 (73 y.o. [name] Primary Care Provider: [name] Other Clinician: Referring Provider: Treating Provider/Extender:Stone III, Lemar Livings, Lendon Ka Weeks in Treatment: 3 Information Obtained From Patient Constitutional Symptoms (General Health) Complaints and Symptoms: Negative for: Fatigue; Fever; Chills; Marked Weight Change Eyes Complaints and  Symptoms: Negative for: Dry Eyes; Vision Changes; Glasses / Contacts Medical History: Positive for: Cataracts - removed Ear/Nose/Mouth/Throat Complaints and Symptoms: Negative for: Chronic sinus problems or rhinitis Respiratory Complaints and Symptoms: Negative for: Chronic or frequent coughs; Shortness of Breath Cardiovascular Complaints and Symptoms: Negative for: Chest pain Medical History: Positive for: Congestive Heart Failure; Coronary Artery Disease; Hypertension; Peripheral Arterial Disease; Peripheral Venous Disease Past Medical History Notes: Hyperlipidemia, Cardiomyopathy Gastrointestinal Complaints and Symptoms: Negative for: Frequent diarrhea; Nausea; Vomiting Endocrine Complaints and Symptoms: Negative for: Heat/cold intolerance Medical History: Positive for: Type II Diabetes Time with diabetes: since 2000 Treated with: Insulin Blood sugar tested every day: Yes Tested : 1-2x a day Genitourinary Complaints and Symptoms: Negative for: Frequent urination Medical History: Positive for: End Stage Renal Disease - Hemodialysis 3x/week Integumentary (Skin) Complaints and Symptoms: Positive for: Wounds Medical History: Negative for: History of Burn Musculoskeletal Complaints and Symptoms: Negative for: Muscle Pain; Muscle Weakness Medical History: Positive for: Osteoarthritis Neurologic Complaints and Symptoms: Negative for: Numbness/parasthesias Medical History: Positive for: Neuropathy Psychiatric Complaints and Symptoms: Negative for: Claustrophobia; Suicidal Hematologic/Lymphatic Immunological Oncologic Medical History: Positive for: Received Radiation Past Medical History Notes: Breast cancer 2014 with lumpectomy HBO Extended History Items Eyes: Cataracts Immunizations Pneumococcal Vaccine: Received Pneumococcal Vaccination: Yes Tetanus Vaccine: Last tetanus shot: 01/13/2012 Implantable Devices None Family and Social History Cancer: Yes  - Father,Siblings; Diabetes: Yes - Maternal Grandparents,Paternal Grandparents,Mother,Father,Siblings; Heart Disease: Yes - Maternal Grandparents,Paternal Grandparents,Mother,Father,Siblings; Hereditary Spherocytosis: No; Hypertension: Yes - Maternal Grandparents,Paternal Grandparents; Kidney Disease: Yes - Siblings; Lung Disease: No; Seizures: No; Stroke: No; Thyroid Problems: No; Tuberculosis: No; Never smoker; Marital Status - Married; Alcohol Use: Never; Drug Use: No History; Caffeine Use: Rarely; Financial Concerns: No; Food, Clothing or Shelter Needs: No; Support System Lacking: No; Transportation Concerns: No Physician Affirmation I have reviewed and agree with the above information. Electronic Signature(s) Signed: 03/16/2019 12:07:41 PM By: Carlene Coria RN Signed: 03/16/2019 12:32:42 PM By: Worthy Keeler PA-C Entered By: Worthy Keeler on 03/16/2019 11:05:23 -------------------------------------------------------------------------------- SuperBill Details Patient Name: Date of Service: [name] 03/16/2019 Medical Record [id number] Patient Account Number: [id number] Date of Birth/Sex: Treating RN: 09-Feb-1946 (73 y.o. 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 of L97.526 necrosis I87.2 Venous insufficiency (chronic) (peripheral) I89.0 Lymphedema, not elsewhere classified L97.812 Non-pressure chronic ulcer of other part of right lower leg with fat layer exposed L97.822 Non-pressure chronic ulcer of other part of left lower leg with fat layer exposed I73.89  Other specified peripheral vascular diseases N18.6 End stage renal disease Z99.2 Dependence on renal dialysis I10 Essential (primary) hypertension Facility Procedures CPT4 Code: [id number] Description: 28315 - DEBRIDE W/O ANES  NON SELECT Modifier: Quantity: 1 Physician Procedures CPT4: Description Modifier Quantity Code [id number] - WC PHYS LEVEL 3 - EST PT 1 ICD-10 Diagnosis Description E11.621 Type 2 diabetes mellitus with foot ulcer L97.512 Non-pressure chronic ulcer of 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d  L97.526 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 of necrosis I87.2 Venous insufficiency (chronic) (peripheral) Electronic Signature(s) Signed: 03/16/2019 4:00:40 PM By: Worthy Keeler PA-C Signed: 03/16/2019 4:59:21 PM By: [name] RN, BSN Previous Signature: 03/16/2019 12:32:42 PM Version By: Worthy Keeler PA-C Entered By: [name] on 03/16/2019 13:43:41

## 2019-03-17 ENCOUNTER — Other Ambulatory Visit: Payer: Self-pay | Admitting: Physician Assistant

## 2019-03-17 ENCOUNTER — Other Ambulatory Visit (HOSPITAL_COMMUNITY): Payer: Self-pay | Admitting: Physician Assistant

## 2019-03-17 DIAGNOSIS — L97529 Non-pressure chronic ulcer of other part of left foot with unspecified severity: Secondary | ICD-10-CM

## 2019-03-18 DIAGNOSIS — M40292 Other kyphosis, cervical region: Secondary | ICD-10-CM | POA: Diagnosis not present

## 2019-03-18 DIAGNOSIS — M5136 Other intervertebral disc degeneration, lumbar region: Secondary | ICD-10-CM | POA: Diagnosis not present

## 2019-03-18 DIAGNOSIS — M9901 Segmental and somatic dysfunction of cervical region: Secondary | ICD-10-CM | POA: Diagnosis not present

## 2019-03-18 DIAGNOSIS — M9903 Segmental and somatic dysfunction of lumbar region: Secondary | ICD-10-CM | POA: Diagnosis not present

## 2019-03-18 LAB — AEROBIC CULTURE W GRAM STAIN (SUPERFICIAL SPECIMEN): Gram Stain: NONE SEEN

## 2019-03-21 DIAGNOSIS — M9903 Segmental and somatic dysfunction of lumbar region: Secondary | ICD-10-CM | POA: Diagnosis not present

## 2019-03-21 DIAGNOSIS — M40292 Other kyphosis, cervical region: Secondary | ICD-10-CM | POA: Diagnosis not present

## 2019-03-21 DIAGNOSIS — M9901 Segmental and somatic dysfunction of cervical region: Secondary | ICD-10-CM | POA: Diagnosis not present

## 2019-03-21 DIAGNOSIS — M5136 Other intervertebral disc degeneration, lumbar region: Secondary | ICD-10-CM | POA: Diagnosis not present

## 2019-03-22 ENCOUNTER — Other Ambulatory Visit: Payer: Self-pay

## 2019-03-22 ENCOUNTER — Ambulatory Visit (HOSPITAL_COMMUNITY)
Admission: RE | Admit: 2019-03-22 | Discharge: 2019-03-22 | Disposition: A | Discharge status: Home / Self Care | Payer: Medicare Other | Source: Ambulatory Visit | Attending: Physician Assistant | Admitting: Physician Assistant

## 2019-03-22 DIAGNOSIS — E1151 Type 2 diabetes mellitus with diabetic peripheral angiopathy without gangrene: Secondary | ICD-10-CM | POA: Diagnosis not present

## 2019-03-22 DIAGNOSIS — A419 Sepsis, unspecified organism: Secondary | ICD-10-CM | POA: Diagnosis not present

## 2019-03-22 DIAGNOSIS — L97529 Non-pressure chronic ulcer of other part of left foot with unspecified severity: Secondary | ICD-10-CM

## 2019-03-23 ENCOUNTER — Inpatient Hospital Stay (HOSPITAL_COMMUNITY)
Admission: EM | Admit: 2019-03-23 | Discharge: 2019-04-07 | DRG: 853 | Disposition: A | Discharge status: Rehabilitation Facility | Payer: Medicare Other | Attending: Internal Medicine | Admitting: Internal Medicine

## 2019-03-23 ENCOUNTER — Encounter (HOSPITAL_BASED_OUTPATIENT_CLINIC_OR_DEPARTMENT_OTHER): Payer: Medicare Other | Admitting: Physician Assistant

## 2019-03-23 ENCOUNTER — Emergency Department (HOSPITAL_COMMUNITY): Payer: Medicare Other

## 2019-03-23 ENCOUNTER — Encounter (HOSPITAL_COMMUNITY): Payer: Self-pay | Admitting: Emergency Medicine

## 2019-03-23 DIAGNOSIS — L02611 Cutaneous abscess of right foot: Secondary | ICD-10-CM | POA: Diagnosis not present

## 2019-03-23 DIAGNOSIS — I739 Peripheral vascular disease, unspecified: Secondary | ICD-10-CM

## 2019-03-23 DIAGNOSIS — E1151 Type 2 diabetes mellitus with diabetic peripheral angiopathy without gangrene: Secondary | ICD-10-CM | POA: Diagnosis present

## 2019-03-23 DIAGNOSIS — Z89421 Acquired absence of other right toe(s): Secondary | ICD-10-CM | POA: Diagnosis not present

## 2019-03-23 DIAGNOSIS — M86372 Chronic multifocal osteomyelitis, left ankle and foot: Secondary | ICD-10-CM

## 2019-03-23 DIAGNOSIS — M609 Myositis, unspecified: Secondary | ICD-10-CM | POA: Diagnosis present

## 2019-03-23 DIAGNOSIS — Z853 Personal history of malignant neoplasm of breast: Secondary | ICD-10-CM

## 2019-03-23 DIAGNOSIS — Z8051 Family history of malignant neoplasm of kidney: Secondary | ICD-10-CM

## 2019-03-23 DIAGNOSIS — E1169 Type 2 diabetes mellitus with other specified complication: Secondary | ICD-10-CM | POA: Diagnosis present

## 2019-03-23 DIAGNOSIS — Z79899 Other long term (current) drug therapy: Secondary | ICD-10-CM

## 2019-03-23 DIAGNOSIS — L97529 Non-pressure chronic ulcer of other part of left foot with unspecified severity: Secondary | ICD-10-CM | POA: Diagnosis present

## 2019-03-23 DIAGNOSIS — E875 Hyperkalemia: Secondary | ICD-10-CM | POA: Diagnosis not present

## 2019-03-23 DIAGNOSIS — M009 Pyogenic arthritis, unspecified: Secondary | ICD-10-CM | POA: Diagnosis present

## 2019-03-23 DIAGNOSIS — M109 Gout, unspecified: Secondary | ICD-10-CM | POA: Diagnosis present

## 2019-03-23 DIAGNOSIS — N186 End stage renal disease: Secondary | ICD-10-CM | POA: Diagnosis not present

## 2019-03-23 DIAGNOSIS — E11621 Type 2 diabetes mellitus with foot ulcer: Secondary | ICD-10-CM | POA: Diagnosis present

## 2019-03-23 DIAGNOSIS — L97829 Non-pressure chronic ulcer of other part of left lower leg with unspecified severity: Secondary | ICD-10-CM | POA: Diagnosis present

## 2019-03-23 DIAGNOSIS — Z01810 Encounter for preprocedural cardiovascular examination: Secondary | ICD-10-CM | POA: Diagnosis not present

## 2019-03-23 DIAGNOSIS — R159 Full incontinence of feces: Secondary | ICD-10-CM | POA: Diagnosis not present

## 2019-03-23 DIAGNOSIS — M86172 Other acute osteomyelitis, left ankle and foot: Secondary | ICD-10-CM | POA: Diagnosis not present

## 2019-03-23 DIAGNOSIS — Z8249 Family history of ischemic heart disease and other diseases of the circulatory system: Secondary | ICD-10-CM

## 2019-03-23 DIAGNOSIS — Z992 Dependence on renal dialysis: Secondary | ICD-10-CM | POA: Diagnosis not present

## 2019-03-23 DIAGNOSIS — Z794 Long term (current) use of insulin: Secondary | ICD-10-CM | POA: Diagnosis not present

## 2019-03-23 DIAGNOSIS — I70203 Unspecified atherosclerosis of native arteries of extremities, bilateral legs: Secondary | ICD-10-CM | POA: Diagnosis present

## 2019-03-23 DIAGNOSIS — I5022 Chronic systolic (congestive) heart failure: Secondary | ICD-10-CM | POA: Diagnosis present

## 2019-03-23 DIAGNOSIS — I70245 Atherosclerosis of native arteries of left leg with ulceration of other part of foot: Secondary | ICD-10-CM | POA: Diagnosis not present

## 2019-03-23 DIAGNOSIS — L97218 Non-pressure chronic ulcer of right calf with other specified severity: Secondary | ICD-10-CM | POA: Diagnosis present

## 2019-03-23 DIAGNOSIS — L089 Local infection of the skin and subcutaneous tissue, unspecified: Secondary | ICD-10-CM | POA: Diagnosis present

## 2019-03-23 DIAGNOSIS — J449 Chronic obstructive pulmonary disease, unspecified: Secondary | ICD-10-CM | POA: Diagnosis present

## 2019-03-23 DIAGNOSIS — E119 Type 2 diabetes mellitus without complications: Secondary | ICD-10-CM | POA: Diagnosis not present

## 2019-03-23 DIAGNOSIS — M19072 Primary osteoarthritis, left ankle and foot: Secondary | ICD-10-CM | POA: Diagnosis present

## 2019-03-23 DIAGNOSIS — Z20828 Contact with and (suspected) exposure to other viral communicable diseases: Secondary | ICD-10-CM | POA: Diagnosis present

## 2019-03-23 DIAGNOSIS — I252 Old myocardial infarction: Secondary | ICD-10-CM

## 2019-03-23 DIAGNOSIS — I10 Essential (primary) hypertension: Secondary | ICD-10-CM | POA: Diagnosis present

## 2019-03-23 DIAGNOSIS — N2581 Secondary hyperparathyroidism of renal origin: Secondary | ICD-10-CM | POA: Diagnosis present

## 2019-03-23 DIAGNOSIS — I132 Hypertensive heart and chronic kidney disease with heart failure and with stage 5 chronic kidney disease, or end stage renal disease: Secondary | ICD-10-CM | POA: Diagnosis present

## 2019-03-23 DIAGNOSIS — Z89422 Acquired absence of other left toe(s): Secondary | ICD-10-CM

## 2019-03-23 DIAGNOSIS — A419 Sepsis, unspecified organism: Principal | ICD-10-CM | POA: Diagnosis present

## 2019-03-23 DIAGNOSIS — D631 Anemia in chronic kidney disease: Secondary | ICD-10-CM | POA: Diagnosis present

## 2019-03-23 DIAGNOSIS — K5900 Constipation, unspecified: Secondary | ICD-10-CM | POA: Diagnosis not present

## 2019-03-23 DIAGNOSIS — M868X7 Other osteomyelitis, ankle and foot: Secondary | ICD-10-CM | POA: Diagnosis present

## 2019-03-23 DIAGNOSIS — E1142 Type 2 diabetes mellitus with diabetic polyneuropathy: Secondary | ICD-10-CM | POA: Diagnosis present

## 2019-03-23 DIAGNOSIS — I447 Left bundle-branch block, unspecified: Secondary | ICD-10-CM | POA: Diagnosis present

## 2019-03-23 DIAGNOSIS — Z48812 Encounter for surgical aftercare following surgery on the circulatory system: Secondary | ICD-10-CM | POA: Diagnosis not present

## 2019-03-23 DIAGNOSIS — Z888 Allergy status to other drugs, medicaments and biological substances status: Secondary | ICD-10-CM

## 2019-03-23 DIAGNOSIS — R7309 Other abnormal glucose: Secondary | ICD-10-CM | POA: Diagnosis not present

## 2019-03-23 DIAGNOSIS — D649 Anemia, unspecified: Secondary | ICD-10-CM | POA: Diagnosis not present

## 2019-03-23 DIAGNOSIS — M5441 Lumbago with sciatica, right side: Secondary | ICD-10-CM | POA: Diagnosis present

## 2019-03-23 DIAGNOSIS — M869 Osteomyelitis, unspecified: Secondary | ICD-10-CM

## 2019-03-23 DIAGNOSIS — G8929 Other chronic pain: Secondary | ICD-10-CM | POA: Diagnosis present

## 2019-03-23 DIAGNOSIS — E785 Hyperlipidemia, unspecified: Secondary | ICD-10-CM | POA: Diagnosis present

## 2019-03-23 DIAGNOSIS — L97519 Non-pressure chronic ulcer of other part of right foot with unspecified severity: Secondary | ICD-10-CM | POA: Diagnosis present

## 2019-03-23 DIAGNOSIS — E0822 Diabetes mellitus due to underlying condition with diabetic chronic kidney disease: Secondary | ICD-10-CM | POA: Diagnosis not present

## 2019-03-23 DIAGNOSIS — E1069 Type 1 diabetes mellitus with other specified complication: Secondary | ICD-10-CM

## 2019-03-23 DIAGNOSIS — Z91040 Latex allergy status: Secondary | ICD-10-CM

## 2019-03-23 DIAGNOSIS — M1990 Unspecified osteoarthritis, unspecified site: Secondary | ICD-10-CM | POA: Diagnosis present

## 2019-03-23 DIAGNOSIS — E1122 Type 2 diabetes mellitus with diabetic chronic kidney disease: Secondary | ICD-10-CM | POA: Diagnosis present

## 2019-03-23 DIAGNOSIS — Z951 Presence of aortocoronary bypass graft: Secondary | ICD-10-CM | POA: Diagnosis not present

## 2019-03-23 DIAGNOSIS — Z95828 Presence of other vascular implants and grafts: Secondary | ICD-10-CM | POA: Diagnosis not present

## 2019-03-23 DIAGNOSIS — Z808 Family history of malignant neoplasm of other organs or systems: Secondary | ICD-10-CM

## 2019-03-23 DIAGNOSIS — M545 Low back pain, unspecified: Secondary | ICD-10-CM

## 2019-03-23 DIAGNOSIS — Z923 Personal history of irradiation: Secondary | ICD-10-CM | POA: Diagnosis not present

## 2019-03-23 DIAGNOSIS — L03116 Cellulitis of left lower limb: Secondary | ICD-10-CM | POA: Diagnosis not present

## 2019-03-23 DIAGNOSIS — Z803 Family history of malignant neoplasm of breast: Secondary | ICD-10-CM

## 2019-03-23 DIAGNOSIS — E8889 Other specified metabolic disorders: Secondary | ICD-10-CM | POA: Diagnosis present

## 2019-03-23 DIAGNOSIS — I998 Other disorder of circulatory system: Secondary | ICD-10-CM | POA: Diagnosis present

## 2019-03-23 DIAGNOSIS — R5381 Other malaise: Secondary | ICD-10-CM | POA: Diagnosis present

## 2019-03-23 DIAGNOSIS — I5042 Chronic combined systolic (congestive) and diastolic (congestive) heart failure: Secondary | ICD-10-CM

## 2019-03-23 DIAGNOSIS — I255 Ischemic cardiomyopathy: Secondary | ICD-10-CM | POA: Diagnosis present

## 2019-03-23 DIAGNOSIS — M792 Neuralgia and neuritis, unspecified: Secondary | ICD-10-CM | POA: Diagnosis not present

## 2019-03-23 DIAGNOSIS — E1149 Type 2 diabetes mellitus with other diabetic neurological complication: Secondary | ICD-10-CM | POA: Diagnosis not present

## 2019-03-23 DIAGNOSIS — I2510 Atherosclerotic heart disease of native coronary artery without angina pectoris: Secondary | ICD-10-CM | POA: Diagnosis present

## 2019-03-23 DIAGNOSIS — N184 Chronic kidney disease, stage 4 (severe): Secondary | ICD-10-CM

## 2019-03-23 DIAGNOSIS — R233 Spontaneous ecchymoses: Secondary | ICD-10-CM | POA: Diagnosis present

## 2019-03-23 DIAGNOSIS — Z7982 Long term (current) use of aspirin: Secondary | ICD-10-CM

## 2019-03-23 DIAGNOSIS — Z87891 Personal history of nicotine dependence: Secondary | ICD-10-CM

## 2019-03-23 DIAGNOSIS — Z91048 Other nonmedicinal substance allergy status: Secondary | ICD-10-CM

## 2019-03-23 DIAGNOSIS — Z91041 Radiographic dye allergy status: Secondary | ICD-10-CM

## 2019-03-23 DIAGNOSIS — E1165 Type 2 diabetes mellitus with hyperglycemia: Secondary | ICD-10-CM | POA: Diagnosis not present

## 2019-03-23 DIAGNOSIS — E559 Vitamin D deficiency, unspecified: Secondary | ICD-10-CM | POA: Diagnosis present

## 2019-03-23 DIAGNOSIS — K5903 Drug induced constipation: Secondary | ICD-10-CM | POA: Diagnosis not present

## 2019-03-23 LAB — CBC WITH DIFFERENTIAL/PLATELET
Abs Immature Granulocytes: 0.05 10*3/uL (ref 0.00–0.07)
Basophils Absolute: 0 10*3/uL (ref 0.0–0.1)
Basophils Relative: 0 %
Eosinophils Absolute: 0.1 10*3/uL (ref 0.0–0.5)
Eosinophils Relative: 1 %
HCT: 33.1 % — ABNORMAL LOW (ref 36.0–46.0)
Hemoglobin: 10.8 g/dL — ABNORMAL LOW (ref 12.0–15.0)
Immature Granulocytes: 1 %
Lymphocytes Relative: 14 %
Lymphs Abs: 1.5 10*3/uL (ref 0.7–4.0)
MCH: 30.9 pg (ref 26.0–34.0)
MCHC: 32.6 g/dL (ref 30.0–36.0)
MCV: 94.6 fL (ref 80.0–100.0)
Monocytes Absolute: 1.1 10*3/uL — ABNORMAL HIGH (ref 0.1–1.0)
Monocytes Relative: 11 %
Neutro Abs: 8 10*3/uL — ABNORMAL HIGH (ref 1.7–7.7)
Neutrophils Relative %: 73 %
Platelets: 274 10*3/uL (ref 150–400)
RBC: 3.5 MIL/uL — ABNORMAL LOW (ref 3.87–5.11)
RDW: 15.6 % — ABNORMAL HIGH (ref 11.5–15.5)
WBC: 10.7 10*3/uL — ABNORMAL HIGH (ref 4.0–10.5)
nRBC: 0 % (ref 0.0–0.2)

## 2019-03-23 LAB — COMPREHENSIVE METABOLIC PANEL
ALT: 23 U/L (ref 0–44)
AST: 22 U/L (ref 15–41)
Albumin: 2.8 g/dL — ABNORMAL LOW (ref 3.5–5.0)
Alkaline Phosphatase: 192 U/L — ABNORMAL HIGH (ref 38–126)
Anion gap: 13 (ref 5–15)
BUN: 40 mg/dL — ABNORMAL HIGH (ref 8–23)
CO2: 27 mmol/L (ref 22–32)
Calcium: 8.4 mg/dL — ABNORMAL LOW (ref 8.9–10.3)
Chloride: 98 mmol/L (ref 98–111)
Creatinine, Ser: 3.4 mg/dL — ABNORMAL HIGH (ref 0.44–1.00)
GFR calc Af Amer: 15 mL/min — ABNORMAL LOW (ref >60)
GFR calc non Af Amer: 13 mL/min — ABNORMAL LOW (ref >60)
Glucose, Bld: 70 mg/dL (ref 70–99)
Potassium: 4.1 mmol/L (ref 3.5–5.1)
Sodium: 138 mmol/L (ref 135–145)
Total Bilirubin: 0.5 mg/dL (ref 0.3–1.2)
Total Protein: 5.9 g/dL — ABNORMAL LOW (ref 6.5–8.1)

## 2019-03-23 LAB — SARS CORONAVIRUS 2 (TAT 6-24 HRS): SARS Coronavirus 2: NEGATIVE

## 2019-03-23 LAB — GLUCOSE, CAPILLARY
Glucose-Capillary: 92 mg/dL (ref 70–99)
Glucose-Capillary: 114 mg/dL — ABNORMAL HIGH (ref 70–99)

## 2019-03-23 LAB — LACTIC ACID, PLASMA
Lactic Acid, Venous: 1.1 mmol/L (ref 0.5–1.9)
Lactic Acid, Venous: 1 mmol/L (ref 0.5–1.9)

## 2019-03-23 LAB — POC SARS CORONAVIRUS 2 AG -  ED: SARS Coronavirus 2 Ag: NEGATIVE

## 2019-03-23 LAB — PROTIME-INR
INR: 1.2 (ref 0.8–1.2)
Prothrombin Time: 15.2 seconds (ref 11.4–15.2)

## 2019-03-23 MED ORDER — PIPERACILLIN-TAZOBACTAM 3.375 G IVPB 30 MIN
3.3750 g | Freq: Once | INTRAVENOUS | Status: AC
  Administered 2019-03-23: 3.375 g via INTRAVENOUS
  Filled 2019-03-23: qty 50

## 2019-03-23 MED ORDER — DOXERCALCIFEROL 4 MCG/2ML IV SOLN
4.0000 ug | INTRAVENOUS | Status: DC
  Administered 2019-03-26: 4 ug via INTRAVENOUS
  Filled 2019-03-23 (×7): qty 2

## 2019-03-23 MED ORDER — HYDROCODONE-ACETAMINOPHEN 5-325 MG PO TABS
0.5000 | ORAL_TABLET | Freq: Four times a day (QID) | ORAL | Status: DC | PRN
  Administered 2019-03-23 – 2019-04-07 (×30): 1 via ORAL
  Filled 2019-03-23 (×29): qty 1

## 2019-03-23 MED ORDER — HYDRALAZINE HCL 25 MG PO TABS: 25.0000 mg | ORAL_TABLET | Freq: Four times a day (QID) | ORAL | Status: DC | PRN

## 2019-03-23 MED ORDER — CHLORHEXIDINE GLUCONATE CLOTH 2 % EX PADS
6.0000 | MEDICATED_PAD | Freq: Every day | CUTANEOUS | Status: DC
  Administered 2019-03-24 – 2019-03-26 (×3): 6 via TOPICAL

## 2019-03-23 MED ORDER — HEPARIN SODIUM (PORCINE) 5000 UNIT/ML IJ SOLN
5000.0000 [IU] | Freq: Two times a day (BID) | INTRAMUSCULAR | Status: DC
  Administered 2019-03-24 – 2019-03-26 (×3): 5000 [IU] via SUBCUTANEOUS
  Filled 2019-03-23 (×3): qty 1

## 2019-03-23 MED ORDER — CINACALCET HCL 30 MG PO TABS
30.0000 mg | ORAL_TABLET | ORAL | Status: DC
  Administered 2019-03-24: 30 mg via ORAL
  Filled 2019-03-23: qty 1

## 2019-03-23 MED ORDER — TRAZODONE HCL 50 MG PO TABS
25.0000 mg | ORAL_TABLET | Freq: Every day | ORAL | Status: DC
  Administered 2019-03-23 – 2019-03-24 (×2): 50 mg via ORAL
  Administered 2019-03-26: 25 mg via ORAL
  Administered 2019-03-27 – 2019-04-06 (×9): 100 mg via ORAL
  Filled 2019-03-23 (×2): qty 2
  Filled 2019-03-23: qty 1
  Filled 2019-03-23 (×3): qty 2
  Filled 2019-03-23: qty 1
  Filled 2019-03-23: qty 2
  Filled 2019-03-23: qty 1
  Filled 2019-03-23 (×2): qty 2
  Filled 2019-03-23: qty 1
  Filled 2019-03-23 (×3): qty 2

## 2019-03-23 MED ORDER — INSULIN ASPART 100 UNIT/ML ~~LOC~~ SOLN
0.0000 [IU] | Freq: Three times a day (TID) | SUBCUTANEOUS | Status: DC
  Administered 2019-03-24: 4 [IU] via SUBCUTANEOUS

## 2019-03-23 MED ORDER — ISOSORBIDE DINITRATE 10 MG PO TABS
10.0000 mg | ORAL_TABLET | Freq: Three times a day (TID) | ORAL | Status: DC
  Administered 2019-03-23 – 2019-04-06 (×39): 10 mg via ORAL
  Filled 2019-03-23 (×48): qty 1

## 2019-03-23 MED ORDER — ASPIRIN EC 81 MG PO TBEC
81.0000 mg | DELAYED_RELEASE_TABLET | Freq: Every day | ORAL | Status: DC
  Administered 2019-03-23 – 2019-03-27 (×5): 81 mg via ORAL
  Filled 2019-03-23 (×5): qty 1

## 2019-03-23 MED ORDER — ALLOPURINOL 100 MG PO TABS
100.0000 mg | ORAL_TABLET | Freq: Two times a day (BID) | ORAL | Status: DC
  Administered 2019-03-23 – 2019-04-06 (×28): 100 mg via ORAL
  Filled 2019-03-23 (×29): qty 1

## 2019-03-23 MED ORDER — VANCOMYCIN HCL 1500 MG/300ML IV SOLN
1500.0000 mg | Freq: Once | INTRAVENOUS | Status: AC
  Administered 2019-03-23: 1500 mg via INTRAVENOUS
  Filled 2019-03-23 (×2): qty 300

## 2019-03-23 MED ORDER — HYDRALAZINE HCL 25 MG PO TABS
25.0000 mg | ORAL_TABLET | Freq: Three times a day (TID) | ORAL | Status: DC
  Administered 2019-03-23 – 2019-04-07 (×34): 25 mg via ORAL
  Filled 2019-03-23 (×37): qty 1

## 2019-03-23 MED ORDER — DARBEPOETIN ALFA 60 MCG/0.3ML IJ SOSY
60.0000 ug | PREFILLED_SYRINGE | INTRAMUSCULAR | Status: DC
  Filled 2019-03-23: qty 0.3

## 2019-03-23 MED ORDER — VANCOMYCIN VARIABLE DOSE PER UNSTABLE RENAL FUNCTION (PHARMACIST DOSING): Status: DC

## 2019-03-23 NOTE — ED Notes (Signed)
Report given to Mariea Clonts, RN on 80M

## 2019-03-23 NOTE — ED Triage Notes (Signed)
Pt here from the [facility] with a wound to the left side of her foot that is not healing , pt had an MRI yesterday that shows septic arthritis ,

## 2019-03-23 NOTE — Consult Note (Signed)
Reason for Consult:Left foot ulcer Referring Physician: Kriss [name] is an 73 y.o. female.  HPI: [name] comes in with a weeks-long hx/o left foot ulcer. She's been having her legs wrapped to prevent swelling and ulceration. They noticed the ulcer 2 weeks ago and that it had gotten worse last week. They sent her to the ED for evaluation. She denies pain. She has known PAD but [facility] did not want to revascularize.  Past Medical History:  Diagnosis Date  . Allergy   . Arthritis    "maybe in my lower back" (08/27/2012)  . 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 Chronic combined systolic and diastolic CHF (congestive heart failure) ([name])   . CKD (chronic kidney disease), stage IV ([name])   . Coronary artery disease    a. NSTEMI in 05/2009 s/p CABG (LIMA-LAD, SVG-diagonal, SVG-OM1/OM 2).   Marland Kitchen Dyspnea    with exertion  . Family history of anesthesia complication    Mom has a hard time to wake up  . Foot ulcer ([name])    "I've had them on both feet" (08/27/2012)  . Gouty arthritis    "right index finger" (08/27/2012)  . History of radiation therapy 06/09/13-07/06/13   left breast 50Gy  . Hyperlipidemia   . Hypertension   . Infection    right second toe  . Ischemic cardiomyopathy    a. 2011: EF 40-45%. b. EF 55-60% in 04/2017 but shortly after as inpatient was 45-50%.  Marland Kitchen LBBB (left bundle branch block)   . Mitral regurgitation   . Neuropathy    Hx; of B/L feet  . NSTEMI (non-ST elevated myocardial infarction) ([place]) 05/23/2009  . Osteomyelitis of foot ([name])   . PAD (peripheral artery disease) ([facility])    a. LE PAD (patient previously elected hold off L fem-pop), prev followed by Dr. Bridgett Larsson  . PONV (postoperative nausea and vomiting)   . Sinus headache    "occasionally" (08/27/2012)  . Type II diabetes mellitus ([name])   . Vitamin D deficiency     Past Surgical History:  Procedure Laterality Date  . AMPUTATION Right 08/27/2012   Procedure:  AMPUTATION RAY;  Surgeon: Newt Minion, MD;  Location: [facility];  Service: Orthopedics;  Laterality: Right;  Right Foot 5th Ray Amputation  . AMPUTATION Left 07/08/2013   Procedure: AMPUTATION RAY;  Surgeon: Newt Minion, MD;  Location: [name];  Service: Orthopedics;  Laterality: Left;  Left Foot 2nd Ray Amputation  . AMPUTATION RAY Right 08/27/2012   5th ray/notes 08/27/2012  . AMPUTATION TOE Right 03/06/2017   Procedure: AMPUTATION TOE, INTERPHANGEAL 2ND RIGHT;  Surgeon: [name], DPM;  Location: [name];  Service: Podiatry;  Laterality: Right;  . AV FISTULA PLACEMENT Right 11/11/2017   Procedure: RIGHT RADIOCEPHALIC  ARTERIOVENOUS FISTULA;  Surgeon: [name], MD;  Location: [facility];  Service: Vascular;  Laterality: Right;  . BREAST LUMPECTOMY  04/20/2013   with biopsy      DR WAKEFIELD  . BREAST LUMPECTOMY WITH NEEDLE LOCALIZATION AND AXILLARY SENTINEL LYMPH NODE BX Left 04/20/2013   Procedure: LEFT BREAST WIRE GUIDED LUMPECTOMY AND AXILLARY SENTINEL LYMPH NODE BX;  Surgeon: Rolm Bookbinder, MD;  Location: [name];  Service: General;  Laterality: Left;  . CARDIAC CATHETERIZATION  05/24/2009   Archie Endo 05/24/2009 (08/27/2012)  . CATARACT EXTRACTION W/ INTRAOCULAR LENS  IMPLANT, BILATERAL  2000's  . COLONOSCOPY W/ BIOPSIES AND POLYPECTOMY  2010  . CORONARY ARTERY  BYPASS GRAFT  2011   "CABG X4" (08/27/2012)  . DENTAL SURGERY  04/30/11   "1 implant" (08/27/2012)  . DILATION AND CURETTAGE OF UTERUS    . EYE SURGERY    . FINGER SURGERY Right    "index finger; turned out to be gout" (08/27/2012)  . IR FLUORO GUIDE CV LINE RIGHT  11/30/2017  . TEE WITHOUT CARDIOVERSION N/A 12/01/2017   Procedure: TRANSESOPHAGEAL ECHOCARDIOGRAM (TEE);  Surgeon: Lelon Perla, MD;  Location: [name] ENDOSCOPY;  Service: Cardiovascular;  Laterality: N/A;    Family History  Problem Relation Age of Onset  . Kidney cancer Brother 9  . Hypertension Brother   . Breast cancer Sister 9       LCIS; BRCA negative  .  Throat cancer Father 84       smoker  . Breast cancer Sister 18       Lobular breast cancer  . Breast cancer Maternal Aunt        dx in her 80s    Social History:  reports that she has quit smoking. She has never used smokeless tobacco. She reports current alcohol use. She reports that she does not use drugs.  Allergies:  Allergies  Allergen Reactions  . Atenolol Other (See Comments) and Rash    Exacerbates gout Exacerbates gout  . Adhesive [Tape] Other (See Comments)    SKIN IS VERY SENSITIVE AND BRUISES AND TEARS EASILY; PLEASE USE AN ALTERNATIVE TO TAPE!!  . Contrast Media [Iodinated Diagnostic Agents] Rash  . Iohexol Rash and Other (See Comments)  . Latex Rash    Medications: I have reviewed the patient's current medications.  Results for orders placed or performed during the hospital encounter of 03/23/19 (from the past 48 hour(s))  Lactic acid, plasma     Status: None   Collection Time: 03/23/19  1:38 PM  Result Value Ref Range   Lactic Acid, Venous 1.1 0.5 - 1.9 mmol/L    Comment: Performed at [name], [name] [address], [name], [name] [address]  Comprehensive metabolic panel     Status: Abnormal   Collection Time: 03/23/19  1:55 PM  Result Value Ref Range   Sodium 138 135 - 145 mmol/L   Potassium 4.1 3.5 - 5.1 mmol/L   Chloride 98 98 - 111 mmol/L   CO2 27 22 - 32 mmol/L   Glucose, Bld 70 70 - 99 mg/dL   BUN 40 (H) 8 - 23 mg/dL   Creatinine, Ser 3.40 (H) 0.44 - 1.00 mg/dL   Calcium 8.4 (L) 8.9 - 10.3 mg/dL   Total Protein 5.9 (L) 6.5 - 8.1 g/dL   Albumin 2.8 (L) 3.5 - 5.0 g/dL   AST 22 15 - 41 U/L   ALT 23 0 - 44 U/L   Alkaline Phosphatase 192 (H) 38 - 126 U/L   Total Bilirubin 0.5 0.3 - 1.2 mg/dL   GFR calc non Af Amer 13 (L) >60 mL/min   GFR calc Af Amer 15 (L) >60 mL/min   Anion gap 13 5 - 15    Comment: Performed at [facility], [name] [name], [name], [name] [address]  CBC with Differential     Status: Abnormal   Collection Time:  03/23/19  1:55 PM  Result Value Ref Range   WBC 10.7 (H) 4.0 - 10.5 K/uL   RBC 3.50 (L) 3.87 - 5.11 MIL/uL   Hemoglobin 10.8 (L) 12.0 - 15.0 g/dL   HCT 33.1 (L) 36.0 -  46.0 %   MCV 94.6 80.0 - 100.0 fL   MCH 30.9 26.0 - 34.0 pg   MCHC 32.6 30.0 - 36.0 g/dL   RDW 15.6 (H) 11.5 - 15.5 %   Platelets 274 150 - 400 K/uL   nRBC 0.0 0.0 - 0.2 %   Neutrophils Relative % 73 %   Neutro Abs 8.0 (H) 1.7 - 7.7 K/uL   Lymphocytes Relative 14 %   Lymphs Abs 1.5 0.7 - 4.0 K/uL   Monocytes Relative 11 %   Monocytes Absolute 1.1 (H) 0.1 - 1.0 K/uL   Eosinophils Relative 1 %   Eosinophils Absolute 0.1 0.0 - 0.5 K/uL   Basophils Relative 0 %   Basophils Absolute 0.0 0.0 - 0.1 K/uL   Immature Granulocytes 1 %   Abs Immature Granulocytes 0.05 0.00 - 0.07 K/uL    Comment: Performed at [facility] [address] [place], [place] [address]  Protime-INR     Status: None   Collection Time: 03/23/19  1:55 PM  Result Value Ref Range   Prothrombin Time 15.2 11.4 - 15.2 seconds   INR 1.2 0.8 - 1.2    Comment: (NOTE) INR goal varies based on device and disease states. Performed at [facility], [place] [address], [place], [place] [address]     DG Chest 2 View  Result Date: 03/23/2019 CLINICAL DATA:  Left foot ulcer. Sepsis. EXAM: CHEST - 2 VIEW COMPARISON:  Aug 25, 2018. FINDINGS: The heart size and mediastinal contours are within normal limits. No pneumothorax or significant pleural effusion is noted. Status post coronary bypass graft. No acute pulmonary disease is noted. Atherosclerosis of thoracic aorta is noted. The visualized skeletal structures are unremarkable. IMPRESSION: No active cardiopulmonary disease. Aortic atherosclerosis. Electronically Signed   By: Marijo Conception M.D.   On: 03/23/2019 14:07   MR FOOT LEFT WO CONTRAST  Result Date: 03/23/2019 CLINICAL DATA:  Nonhealing ulcer along the lateral aspect of the foot. EXAM: MRI OF THE LEFT FOOT WITHOUT CONTRAST TECHNIQUE:  Multiplanar, multisequence MR imaging of the 03/10/2019 was performed. No intravenous contrast was administered. COMPARISON:  Radiographs 03/10/2019 FINDINGS: Diffuse abnormal T1 and T2 signal intensity in the mid distal aspect of the fifth metatarsal and also in the proximal phalanx. There is joint space narrowing at the fifth MTP joint, small joint effusion and surrounding inflammatory phlegmon and abscess. Findings consistent with septic arthritis at the fifth MTP joint and osteomyelitis involving the fifth metatarsal and fifth proximal phalanx. Remote partial second ray amputation. No other areas of osteomyelitis are identified. Moderate to advanced midfoot degenerative changes but no erosive findings or destructive bony changes. Diffuse subcutaneous soft tissue swelling/edema/fluid suggesting cellulitis. There is also mild diffuse myositis. IMPRESSION: 1. Septic arthritis at the fifth MTP joint with osteomyelitis involving the distal half of the fifth metatarsal and also the proximal phalanx. 2. Surrounding inflammatory phlegmon and abscess. 3. Diffuse cellulitis and myofasciitis. Electronically Signed   By: Marijo Sanes M.D.   On: 03/23/2019 12:07    Review of Systems  Constitutional: Negative for chills, diaphoresis and fever.  HENT: Negative for ear discharge, ear pain, hearing loss and tinnitus.   Eyes: Negative for photophobia and pain.  Respiratory: Negative for cough and shortness of breath.   Cardiovascular: Negative for chest pain.  Gastrointestinal: Negative for abdominal pain, nausea and vomiting.  Genitourinary: Negative for dysuria, flank pain, frequency and urgency.  Musculoskeletal: Negative for arthralgias, back pain, myalgias and neck pain.  Neurological: Negative for dizziness and headaches.  Hematological: Does not bruise/bleed easily.  Psychiatric/Behavioral: The patient is not nervous/anxious.    Blood pressure (!) 134/56, pulse 88, temperature 99 F (37.2 C), temperature  source Oral, resp. rate 18, SpO2 96 %. Physical Exam  Constitutional: She appears well-developed and well-nourished. No distress.  HENT:  Head: Normocephalic and atraumatic.  Eyes: Conjunctivae are normal. Right eye exhibits no discharge. Left eye exhibits no discharge. No scleral icterus.  Cardiovascular: Normal rate and regular rhythm.  Respiratory: Effort normal. No respiratory distress.  Musculoskeletal:     Cervical back: Normal range of motion.     Comments: LLE No traumatic wounds, ecchymosis, or rash  Ulceration lateral foot at 5th MTP joint  No knee or ankle effusion  Knee stable to varus/ valgus and anterior/posterior stress  Sens DPN, SPN, TN intact  Motor EHL, ext, flex, evers 5/5  DP 0, PT 0, No significant edema  Neurological: She is alert.  Skin: Skin is warm and dry. She is not diaphoretic.  Psychiatric: She has a normal mood and affect. Her behavior is normal.    Assessment/Plan: Left foot ulcer -- Dr. Sharol Given to evaluate later today or in AM.  Multiple medical problems including CAD, hx/o breast ca, CKD, CHF, PAD, HLD, HTN, and DM -- per medicine who will admit and clear for surgery. Appreciate their help.    Lisette Abu, PA-C Orthopedic Surgery [phone] 03/23/2019, 3:46 PM

## 2019-03-23 NOTE — ED Notes (Signed)
Pt is NSR w/BBB on monitor 

## 2019-03-23 NOTE — Consult Note (Signed)
Hospital Consult    Reason for Consult: Left fifth toe osteomyelitis as well as nonhealing wounds to the right lower extremity in setting of PVD Referring Physician: Dr. Roosevelt Locks, Hospitalist MRN #:  [id number]  History of Present Illness: This is a 73 y.o. female with history of hypertension, hyperlipidemia, coronary artery disease status post CABG, DM, end-stage renal disease on hemodialysis Tuesday Thursday Saturday, CHF (last EF 50-55% 12/01/17), as well as peripheral vascular disease that vascular surgery has been consulted for nonhealing wound and osteomyelitis of the left fifth toe (she also has wound on the right foot as well along the shin and plantar surface).  Patient states that she has been going to wound clinic for her left leg and has been in compression wraps for leg swelling.  She is unsure exactly how long she had the wound on her left foot but guesses about 2 weeks.  She was sent to the ER today by the wound clinic for IV antibiotics.  She is being admitted to medicine.  Orthopedic surgery has also been consulted.  Has baseline neuropathy in her feet, but no new numbness or weakness. Patient was previously evaluated by Dr. Donnetta Hutching on 07/27/2018 for right foot wound in the setting of bilateral SFA occlusions.  At that time she was having improvement in the wound in the arch of her right foot and conservative measures were recommended.  Dr. Donnetta Hutching discussed we could do arteriography if she had negative changes in the wound.  He has also performed a right radiocephalic AV fistula.  Past Medical History:  Diagnosis Date  . Allergy   . Arthritis    "maybe in my lower back" (08/27/2012)  . 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 Chronic combined systolic and diastolic CHF (congestive heart failure) ([place])   . CKD (chronic kidney disease), stage IV ([place])   . Coronary artery disease    a. NSTEMI in 05/2009 s/p CABG (LIMA-LAD, SVG-diagonal,  SVG-OM1/OM 2).   Marland Kitchen Dyspnea    with exertion  . Family history of anesthesia complication    Mom has a hard time to wake up  . Foot ulcer ([place])    "I've had them on both feet" (08/27/2012)  . Gouty arthritis    "right index finger" (08/27/2012)  . History of radiation therapy 06/09/13-07/06/13   left breast 50Gy  . Hyperlipidemia   . Hypertension   . Infection    right second toe  . Ischemic cardiomyopathy    a. 2011: EF 40-45%. b. EF 55-60% in 04/2017 but shortly after as inpatient was 45-50%.  Marland Kitchen LBBB (left bundle branch block)   . Mitral regurgitation   . Neuropathy    Hx; of B/L feet  . NSTEMI (non-ST elevated myocardial infarction) ([place]) 05/23/2009  . Osteomyelitis of foot ([place])   . PAD (peripheral artery disease) ([facility])    a. LE PAD (patient previously elected hold off L fem-pop), prev followed by Dr. Bridgett Larsson  . PONV (postoperative nausea and vomiting)   . Sinus headache    "occasionally" (08/27/2012)  . Type II diabetes mellitus ([place])   . Vitamin D deficiency     Past Surgical History:  Procedure Laterality Date  . AMPUTATION Right 08/27/2012   Procedure: AMPUTATION RAY;  Surgeon: Newt Minion, MD;  Location: [facility];  Service: Orthopedics;  Laterality: Right;  Right Foot 5th Ray Amputation  . AMPUTATION Left 07/08/2013   Procedure: AMPUTATION RAY;  Surgeon: Newt Minion,  MD;  Location: [facility];  Service: Orthopedics;  Laterality: Left;  Left Foot 2nd Ray Amputation  . AMPUTATION RAY Right 08/27/2012   5th ray/notes 08/27/2012  . AMPUTATION TOE Right 03/06/2017   Procedure: AMPUTATION TOE, INTERPHANGEAL 2ND RIGHT;  Surgeon: [name], DPM;  Location: [facility];  Service: Podiatry;  Laterality: Right;  . AV FISTULA PLACEMENT Right 11/11/2017   Procedure: RIGHT RADIOCEPHALIC  ARTERIOVENOUS FISTULA;  Surgeon: [name], MD;  Location: [facility];  Service: Vascular;  Laterality: Right;  . BREAST LUMPECTOMY  04/20/2013   with biopsy      DR [place]FIELD  . BREAST LUMPECTOMY WITH NEEDLE  LOCALIZATION AND AXILLARY SENTINEL LYMPH NODE BX Left 04/20/2013   Procedure: LEFT BREAST WIRE GUIDED LUMPECTOMY AND AXILLARY SENTINEL LYMPH NODE BX;  Surgeon: Rolm Bookbinder, MD;  Location: [facility];  Service: General;  Laterality: Left;  . CARDIAC CATHETERIZATION  05/24/2009   Archie Endo 05/24/2009 (08/27/2012)  . CATARACT EXTRACTION W/ INTRAOCULAR LENS  IMPLANT, BILATERAL  2000's  . COLONOSCOPY W/ BIOPSIES AND POLYPECTOMY  2010  . CORONARY ARTERY BYPASS GRAFT  2011   "CABG X4" (08/27/2012)  . DENTAL SURGERY  04/30/11   "1 implant" (08/27/2012)  . DILATION AND CURETTAGE OF UTERUS    . EYE SURGERY    . FINGER SURGERY Right    "index finger; turned out to be gout" (08/27/2012)  . IR FLUORO GUIDE CV LINE RIGHT  11/30/2017  . TEE WITHOUT CARDIOVERSION N/A 12/01/2017   Procedure: TRANSESOPHAGEAL ECHOCARDIOGRAM (TEE);  Surgeon: Lelon Perla, MD;  [facility] ENDOSCOPY;  Service: Cardiovascular;  Laterality: N/A;    Allergies  Allergen Reactions  . Atenolol Other (See Comments) and Rash    Exacerbates gout Exacerbates gout  . Adhesive [Tape] Other (See Comments)    SKIN IS VERY SENSITIVE AND BRUISES AND TEARS EASILY; PLEASE USE AN ALTERNATIVE TO TAPE!!  . Contrast Media [Iodinated Diagnostic Agents] Rash  . Iohexol Rash and Other (See Comments)  . Latex Rash    Prior to Admission medications   Medication Sig Start Date End Date Taking? Authorizing Provider  allopurinol (ZYLOPRIM) 100 MG tablet Take 1 tablet 2 x /day to Prevent Gout Patient taking differently: Take 100 mg by mouth 2 (two) times daily.  10/27/18  Yes Unk Pinto, MD  aspirin EC 81 MG tablet Take 1 tablet (81 mg total) by mouth daily. 05/18/17  Yes Burtis Junes, NP  Aspirin-Acetaminophen-Caffeine (EXCEDRIN EXTRA STRENGTH PO) Take 2 tablets by mouth daily as needed (pain).    Yes [provider]  b complex-vitamin c-folic acid (NEPHRO-VITE) 0.8 MG TABS tablet Take 1 tablet by mouth 3 (three) times a week. Tuesday,  Thursday, Saturday 03/19/18  Yes [provider]  cinacalcet (SENSIPAR) 30 MG tablet Take 30 mg by mouth 3 (three) times a week. Takes 1 tablet 3 days a week. Harrell Lark, Sat   Yes [provider]  docusate sodium (COLACE) 100 MG capsule Take 100 mg by mouth at bedtime.   Yes [provider]  fluticasone (FLONASE) 50 MCG/ACT nasal spray Place 2 sprays into both nostrils daily as needed for allergies or rhinitis.   Yes [provider]  hydrALAZINE (APRESOLINE) 25 MG tablet Take 3 tablets (75 mg) 3 x /day for BP & Heart 10/27/18  Yes Unk Pinto, MD  HYDROcodone-acetaminophen (NORCO/VICODIN) 5-325 MG tablet Take 0.5-1 tablets by mouth every 6 (six) hours as needed for severe pain. 03/01/19  Yes Liane Comber, NP  insulin  NPH-regular Human (70-30) 100 UNIT/ML injection Inject 10-30 Units into the skin 2 (two) times daily with a meal.   Yes [provider]  isosorbide dinitrate (ISORDIL) 10 MG tablet Take 1 tablet 3 x /day for BP & Heart Patient taking differently: Take 10 mg by mouth 3 (three) times daily. Take 1 tablet 3 x /day for BP & Heart 10/27/18  Yes Unk Pinto, MD  loratadine (CLARITIN) 10 MG tablet Take 10 mg by mouth daily.   Yes [provider]  Multiple Vitamins-Minerals (PRESERVISION AREDS 2 PO) Take 2 capsules by mouth daily.   Yes [provider]  Probiotic Product (PROBIOTIC DAILY PO) Take 1 tablet by mouth daily.   Yes [provider]  psyllium (METAMUCIL) 58.6 % powder Take 1 packet by mouth 3 (three) times daily.   Yes [provider]  sevelamer carbonate (RENVELA) 800 MG tablet Take 1,600 mg by mouth 3 (three) times daily with meals.  04/20/18  Yes [provider]  traZODone (DESYREL) 50 MG tablet 1/2-2 tablets 1 hour prior to bedtime for sleep. Patient taking differently: Take 25-100 mg by mouth at bedtime. 1/2-2 tablets 1 hour prior to bedtime for sleep. 03/01/19  Yes Liane Comber,  NP  BD VEO INSULIN SYRINGE U/F 31G X 15/64" 1 ML MISC USE AS DIRECTED WITH INSULIN 02/06/18   Unk Pinto, MD  glucose blood (PRODIGY NO CODING BLOOD GLUC) test strip CHECK SUGARS 3 TIMES A DAY.[id number] 02/08/18   Unk Pinto, MD    Social History   Socioeconomic History  . Marital status: Married    Spouse name: [name]  . Number of children: 1  . Years of education: Not on file  . Highest education level: Not on file  Occupational History  . Not on file  Tobacco Use  . Smoking status: Former Research scientist (life sciences)  . Smokeless tobacco: Never Used  Substance and Sexual Activity  . Alcohol use: Yes    Comment: rarely drinks wine  . Drug use: No  . Sexual activity: Not Currently  Other Topics Concern  . Not on file  Social History Narrative   ** Merged History Encounter **       Social Determinants of Health   Financial Resource Strain:   . Difficulty of Paying Living Expenses: Not on file  Food Insecurity:   . Worried About Charity fundraiser in the Last Year: Not on file  . Ran Out of Food in the Last Year: Not on file  Transportation Needs:   . Lack of Transportation (Medical): Not on file  . Lack of Transportation (Non-Medical): Not on file  Physical Activity:   . Days of Exercise per Week: Not on file  . Minutes of Exercise per Session: Not on file  Stress:   . Feeling of Stress : Not on file  Social Connections:   . Frequency of Communication with Friends and Family: Not on file  . Frequency of Social Gatherings with Friends and Family: Not on file  . Attends Religious Services: Not on file  . Active Member of Clubs or Organizations: Not on file  . Attends Archivist Meetings: Not on file  . Marital Status: Not on file  Intimate Partner Violence:   . Fear of Current or Ex-Partner: Not on file  . Emotionally Abused: Not on file  . Physically Abused: Not on file  . Sexually Abused: Not on file     Family History  Problem Relation Age of Onset  .  Kidney  cancer Brother 41  . Hypertension Brother   . Breast cancer Sister 34       LCIS; BRCA negative  . Throat cancer Father 34       smoker  . Breast cancer Sister 18       Lobular breast cancer  . Breast cancer Maternal Aunt        dx in her 59s    ROS: _0  Positive   _1  Negative   _2  All sytems reviewed and are negative  Cardiovascular: _3  chest pain/pressure _4  palpitations _5  SOB lying flat _6  DOE _7  pain in legs while walking _8  pain in legs at rest _9  pain in legs at night _10  non-healing ulcers _11  hx of DVT _12  swelling in legs  Pulmonary: _13  productive cough _14  asthma/wheezing _15  home O2  Neurologic: _16  weakness in _17  arms _18  legs _19  numbness in _20  arms _21  legs _22  hx of CVA _23  mini stroke _24 difficulty speaking or slurred speech _25  temporary loss of vision in one eye _26  dizziness  Hematologic: _27  hx of cancer _28  bleeding problems _29  problems with blood clotting easily  Endocrine:   _30  diabetes _31  thyroid disease  GI _32  vomiting blood _33  blood in stool  GU: _34  CKD/renal failure _35  HD--_36  M/W/F or _37  T/T/S _38  burning with urination _39  blood in urine  Psychiatric: _40  anxiety _41  depression  Musculoskeletal: _42  arthritis _43  joint pain  Integumentary: _44  rashes _45  ulcers  Constitutional: _46  fever _47  chills   Physical Examination  Vitals:   03/23/19 1700 03/23/19 1741  BP: (!) 147/66 (!) 147/66  Pulse: 87   Resp: 20   Temp:    SpO2: 97%    There is no height or weight on file to calculate BMI.  General:  NAD Gait: Not observed HENT: WNL, normocephalic Pulmonary: normal non-labored breathing, without Rales, rhonchi,  wheezing Cardiac: regular, without  Murmurs, rubs or gallops Abdomen: soft, NT/ND, no masses Vascular Exam/Pulses:  Right Left  Radial    Ulnar    Femoral 2+ (normal) 2+ (normal)  Popliteal Non-palpable Non-palpable  DP Non-palpable, monophasic signal Non-palpable, monophasic signal  PT Non-palpable, monophasic  signal Non-palpable, monophasic signal   Extremities:       Left lateral foot wound at 5th metatarsal. Small clean ulcer on plantar surface of right foot. Right shin wound Musculoskeletal: no muscle wasting or atrophy  Neurologic: A&O X 3; Appropriate Affect ; SENSATION: normal; MOTOR FUNCTION:  moving all extremities equally. Speech is fluent/normal   CBC    Component Value Date/Time   WBC 10.7 (H) 03/23/2019 1355   RBC 3.50 (L) 03/23/2019 1355   HGB 10.8 (L) 03/23/2019 1355   HGB 11.7 05/18/2017 1218   HGB 12.3 02/02/2017 1136   HCT 33.1 (L) 03/23/2019 1355   HCT 34.7 05/18/2017 1218   HCT 37.4 02/02/2017 1136   PLT 274 03/23/2019 1355   PLT 227 05/18/2017 1218   MCV 94.6 03/23/2019 1355   MCV 87 05/18/2017 1218   MCV 90.6 02/02/2017 1136   MCH 30.9 03/23/2019 1355   MCHC 32.6 03/23/2019 1355   RDW 15.6 (H) 03/23/2019 1355   RDW 16.0 (H) 05/18/2017 1218   RDW 15.7 (H) 02/02/2017 1136   LYMPHSABS 1.5 03/23/2019 1355   LYMPHSABS 1.8 02/02/2017 1136   MONOABS 1.1 (H) 03/23/2019 1355   MONOABS 0.8 02/02/2017 1136   EOSABS 0.1 03/23/2019 1355   EOSABS 0.3 02/02/2017 1136   BASOSABS 0.0 03/23/2019 1355  BASOSABS 0.1 02/02/2017 1136    BMET    Component Value Date/Time   NA 138 03/23/2019 1355   NA 143 05/18/2017 1218   NA 143 02/02/2017 1137   K 4.1 03/23/2019 1355   K 3.8 02/02/2017 1137   CL 98 03/23/2019 1355   CO2 27 03/23/2019 1355   CO2 24 02/02/2017 1137   GLUCOSE 70 03/23/2019 1355   GLUCOSE 74 02/02/2017 1137   BUN 40 (H) 03/23/2019 1355   BUN 76 (HH) 05/18/2017 1218   BUN 63.4 (H) 02/02/2017 1137   CREATININE 3.40 (H) 03/23/2019 1355   CREATININE 3.17 (H) 01/10/2019 1525   CREATININE 3.0 (HH) 02/02/2017 1137   CALCIUM 8.4 (L) 03/23/2019 1355   CALCIUM 9.9 02/02/2017 1137   GFRNONAA 13 (L) 03/23/2019 1355   GFRNONAA 14 (L) 01/10/2019 1525   GFRAA 15 (L) 03/23/2019 1355   GFRAA 16 (L) 01/10/2019 1525    COAGS: Lab Results  Component  Value Date   INR 1.2 03/23/2019   INR 1.17 11/30/2017   INR 1.24 11/26/2017     Non-Invasive Vascular Imaging:    ABIs from 07/20/2018 showed monophasic waveforms at the ankle.  Bilateral lower extremity arterial duplex from 07/19/2018 shows bilateral SFA occlusions.   ASSESSMENT/PLAN: This is a 73 y.o. female with multiple medical problems as listed above that presents with open left wound and osteomyelitis of the left fifth metatarsal.  Discussed with the patient that even though she has bilateral lower extremity tissue loss as documented above, should focus on the left leg at this time since it is acutely worsening and the reason for prompting hospital admission.  She previously had ABIs and arterial duplex that shows monophasic waveforms at the ankles with bilateral SFA occlusions.  I think she would benefit from aortogram, lower extremity arteriogram, with possible intervention.  Discussed that we could certainly image both legs but would only be able to intervene on the left leg initially.  Certainly a possibility that she would require bypass.  She is due for dialysis tomorrow and I talked to the nephrologist and we will try and get her scheduled for Friday.  Discussed she is at risk for limb loss even with maximal intervention.    Marty Heck, MD [facility] Office: [phone] Pager: [place]

## 2019-03-23 NOTE — ED Notes (Signed)
ED TO INPATIENT HANDOFF REPORT  ED Nurse Name and Phone #: Lorrin Goodell [phone]  S Name/Age/Gender [name] 73 y.o. female Room/Bed: 057C/057C  Code Status   Code Status: Full Code  Home/SNF/Other Home Patient oriented to: self, place, time and situation Is this baseline? Yes   Triage Complete: Triage complete  Chief Complaint Osteomyelitis of foot, left, acute [facility]) [M86.172]  Triage Note Pt here from the Foot doctor with a wound to the left side of her foot that is not healing , pt had an MRI yesterday that shows septic arthritis ,      Allergies Allergies  Allergen Reactions  . Atenolol Other (See Comments) and Rash    Exacerbates gout Exacerbates gout  . Adhesive [Tape] Other (See Comments)    SKIN IS VERY SENSITIVE AND BRUISES AND TEARS EASILY; PLEASE USE AN ALTERNATIVE TO TAPE!!  . Contrast Media [Iodinated Diagnostic Agents] Rash  . Iohexol Rash and Other (See Comments)  . Latex Rash    Level of Care/Admitting Diagnosis ED Disposition    ED Disposition Condition [facility] Area: [place] [[id number]]  Level of Care: Med-Surg [16]  Covid Evaluation: Asymptomatic Screening Protocol (No Symptoms)  Diagnosis: Osteomyelitis of foot, left, acute [facility]) [[id number]]  Admitting Physician: Lequita Halt [[id number]]  Attending Physician: Lequita Halt [[id number]]  Estimated length of stay: 3 - 4 days  Certification:: I certify this patient will need inpatient services for at least 2 midnights       B Medical/Surgery History Past Medical History:  Diagnosis Date  . Allergy   . Arthritis    "maybe in my lower back" (08/27/2012)  . 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 Chronic combined systolic and diastolic CHF (congestive heart failure) ([place])   . CKD (chronic kidney disease), stage IV ([place])   . Coronary artery disease    a. NSTEMI in 05/2009 s/p CABG (LIMA-LAD, SVG-diagonal,  SVG-OM1/OM 2).   Marland Kitchen Dyspnea    with exertion  . Family history of anesthesia complication    Mom has a hard time to wake up  . Foot ulcer ([place])    "I've had them on both feet" (08/27/2012)  . Gouty arthritis    "right index finger" (08/27/2012)  . History of radiation therapy 06/09/13-07/06/13   left breast 50Gy  . Hyperlipidemia   . Hypertension   . Infection    right second toe  . Ischemic cardiomyopathy    a. 2011: EF 40-45%. b. EF 55-60% in 04/2017 but shortly after as inpatient was 45-50%.  Marland Kitchen LBBB (left bundle branch block)   . Mitral regurgitation   . Neuropathy    Hx; of B/L feet  . NSTEMI (non-ST elevated myocardial infarction) ([place]) 05/23/2009  . Osteomyelitis of foot ([place])   . PAD (peripheral artery disease) ([facility])    a. LE PAD (patient previously elected hold off L fem-pop), prev followed by Dr. Bridgett Larsson  . PONV (postoperative nausea and vomiting)   . Sinus headache    "occasionally" (08/27/2012)  . Type II diabetes mellitus ([place])   . Vitamin D deficiency    Past Surgical History:  Procedure Laterality Date  . AMPUTATION Right 08/27/2012   Procedure: AMPUTATION RAY;  Surgeon: Newt Minion, MD;  Location: [place];  Service: Orthopedics;  Laterality: Right;  Right Foot 5th Ray Amputation  . AMPUTATION Left 07/08/2013   Procedure: AMPUTATION RAY;  Surgeon: Newt Minion,  MD;  Location: [place];  Service: Orthopedics;  Laterality: Left;  Left Foot 2nd Ray Amputation  . AMPUTATION RAY Right 08/27/2012   5th ray/notes 08/27/2012  . AMPUTATION TOE Right 03/06/2017   Procedure: AMPUTATION TOE, INTERPHANGEAL 2ND RIGHT;  Surgeon: [name], DPM;  Location: [place];  Service: Podiatry;  Laterality: Right;  . AV FISTULA PLACEMENT Right 11/11/2017   Procedure: RIGHT RADIOCEPHALIC  ARTERIOVENOUS FISTULA;  Surgeon: [name], MD;  Location: [place];  Service: Vascular;  Laterality: Right;  . BREAST LUMPECTOMY  04/20/2013   with biopsy      DR WAKEFIELD  . BREAST LUMPECTOMY WITH NEEDLE  LOCALIZATION AND AXILLARY SENTINEL LYMPH NODE BX Left 04/20/2013   Procedure: LEFT BREAST WIRE GUIDED LUMPECTOMY AND AXILLARY SENTINEL LYMPH NODE BX;  Surgeon: Rolm Bookbinder, MD;  Location: [place];  Service: General;  Laterality: Left;  . CARDIAC CATHETERIZATION  05/24/2009   Archie Endo 05/24/2009 (08/27/2012)  . CATARACT EXTRACTION W/ INTRAOCULAR LENS  IMPLANT, BILATERAL  2000's  . COLONOSCOPY W/ BIOPSIES AND POLYPECTOMY  2010  . CORONARY ARTERY BYPASS GRAFT  2011   "CABG X4" (08/27/2012)  . DENTAL SURGERY  04/30/11   "1 implant" (08/27/2012)  . DILATION AND CURETTAGE OF UTERUS    . EYE SURGERY    . FINGER SURGERY Right    "index finger; turned out to be gout" (08/27/2012)  . IR FLUORO GUIDE CV LINE RIGHT  11/30/2017  . TEE WITHOUT CARDIOVERSION N/A 12/01/2017   Procedure: TRANSESOPHAGEAL ECHOCARDIOGRAM (TEE);  Surgeon: Lelon Perla, MD;  Location: [facility] ENDOSCOPY;  Service: Cardiovascular;  Laterality: N/A;     A IV Location/Drains/Wounds Patient Lines/Drains/Airways Status   Active Line/Drains/Airways    Name:   Placement date:   Placement time:   Site:   Days:   Peripheral IV 04/20/13 Right Arm   04/20/13    1048    Arm   2163   Peripheral IV 08/25/18 Left Forearm   08/25/18    0045    Forearm   210   Peripheral IV 03/23/19 Left Forearm   03/23/19    1541    Forearm   less than 1   Fistula / Graft Right Forearm Arteriovenous fistula   --    --    Forearm      Hemodialysis Catheter Right Internal jugular Double-lumen   11/30/17    1101    Internal jugular   478   Incision 08/27/12 Foot Right   08/27/12    1525     2399   Incision 04/20/13 Other (Comment) Left   04/20/13    1236     2163   Incision 04/20/13 Breast Left   04/20/13    1236     2163   Incision (Closed) 07/08/13 Toe (Comment  which one) Left   07/08/13    1252     2084   Incision (Closed) 03/06/17 Foot Right   03/06/17    1637     747   Incision (Closed) 11/11/17 Arm Right   11/11/17    1235     497   Wound 11/20/12 Diabetic  ulcer Foot Left;Medial PT has 1cmx 1 cm diabetic ulcer to left medial aspect of foot. Being tx by Dr. Toni Amend, but noticed increased drainage since Thursday    11/20/12    1202    Foot   2314   Wound / Incision (Open or Dehisced) 06/04/13 Diabetic ulcer Toe (Comment  which one)  Left open sore/blister with purulent drainage   06/04/13    1700    Toe (Comment  which one)   2118   Wound / Incision (Open or Dehisced) 11/03/17 Diabetic ulcer Foot Posterior;Left on third toe    11/03/17    1742    Foot   505          Intake/Output Last 24 hours No intake or output data in the 24 hours ending 03/23/19 1743  Labs/Imaging Results for orders placed or performed during the hospital encounter of 03/23/19 (from the past 48 hour(s))  Lactic acid, plasma     Status: None   Collection Time: 03/23/19  1:38 PM  Result Value Ref Range   Lactic Acid, Venous 1.1 0.5 - 1.9 mmol/L    Comment: Performed at [facility], [place] [address] [place], [place] [address]  Comprehensive metabolic panel     Status: Abnormal   Collection Time: 03/23/19  1:55 PM  Result Value Ref Range   Sodium 138 135 - 145 mmol/L   Potassium 4.1 3.5 - 5.1 mmol/L   Chloride 98 98 - 111 mmol/L   CO2 27 22 - 32 mmol/L   Glucose, Bld 70 70 - 99 mg/dL   BUN 40 (H) 8 - 23 mg/dL   Creatinine, Ser 3.40 (H) 0.44 - 1.00 mg/dL   Calcium 8.4 (L) 8.9 - 10.3 mg/dL   Total Protein 5.9 (L) 6.5 - 8.1 g/dL   Albumin 2.8 (L) 3.5 - 5.0 g/dL   AST 22 15 - 41 U/L   ALT 23 0 - 44 U/L   Alkaline Phosphatase 192 (H) 38 - 126 U/L   Total Bilirubin 0.5 0.3 - 1.2 mg/dL   GFR calc non Af Amer 13 (L) >60 mL/min   GFR calc Af Amer 15 (L) >60 mL/min   Anion gap 13 5 - 15    Comment: Performed at [facility], [place] [address] [place], [place] [address]  CBC with Differential     Status: Abnormal   Collection Time: 03/23/19  1:55 PM  Result Value Ref Range   WBC 10.7 (H) 4.0 - 10.5 K/uL   RBC 3.50 (L) 3.87 - 5.11 MIL/uL   Hemoglobin 10.8 (L) 12.0  - 15.0 g/dL   HCT 33.1 (L) 36.0 - 46.0 %   MCV 94.6 80.0 - 100.0 fL   MCH 30.9 26.0 - 34.0 pg   MCHC 32.6 30.0 - 36.0 g/dL   RDW 15.6 (H) 11.5 - 15.5 %   Platelets 274 150 - 400 K/uL   nRBC 0.0 0.0 - 0.2 %   Neutrophils Relative % 73 %   Neutro Abs 8.0 (H) 1.7 - 7.7 K/uL   Lymphocytes Relative 14 %   Lymphs Abs 1.5 0.7 - 4.0 K/uL   Monocytes Relative 11 %   Monocytes Absolute 1.1 (H) 0.1 - 1.0 K/uL   Eosinophils Relative 1 %   Eosinophils Absolute 0.1 0.0 - 0.5 K/uL   Basophils Relative 0 %   Basophils Absolute 0.0 0.0 - 0.1 K/uL   Immature Granulocytes 1 %   Abs Immature Granulocytes 0.05 0.00 - 0.07 K/uL    Comment: Performed at [facility], [place] [address], [place], [place] [address]  Protime-INR     Status: None   Collection Time: 03/23/19  1:55 PM  Result Value Ref Range   Prothrombin Time 15.2 11.4 - 15.2 seconds   INR 1.2 0.8 - 1.2  Comment: (NOTE) INR goal varies based on device and disease states. Performed at [facility], [place] [address] [place], [place] [address]   POC SARS Coronavirus 2 Ag-ED - Nasal Swab (BD Veritor Kit)     Status: None   Collection Time: 03/23/19  3:53 PM  Result Value Ref Range   SARS Coronavirus 2 Ag NEGATIVE NEGATIVE    Comment: (NOTE) SARS-CoV-2 antigen NOT DETECTED.  Negative results are presumptive.  Negative results do not preclude SARS-CoV-2 infection and should not be used as the sole basis for treatment or other patient management decisions, including infection  control decisions, particularly in the presence of clinical signs and  symptoms consistent with COVID-19, or in those who have been in contact with the virus.  Negative results must be combined with clinical observations, patient history, and epidemiological information. The expected result is Negative. Fact Sheet for Patients: PodPark.tn Fact Sheet for Healthcare Providers: GiftContent.is This  test is not yet approved or cleared by the [place] FDA and  has been authorized for detection and/or diagnosis of SARS-CoV-2 by FDA under an Emergency Use Authorization (EUA).  This EUA will remain in effect (meaning this test can be used) for the duration of  the COVID-19 de claration under Section 564(b)(1) of the Act, 21 U.S.C. section 360bbb-3(b)(1), unless the authorization is terminated or revoked sooner.   Lactic acid, plasma     Status: None   Collection Time: 03/23/19  4:55 PM  Result Value Ref Range   Lactic Acid, Venous 1.0 0.5 - 1.9 mmol/L    Comment: Performed at [facility] [address], [place], [place] [address]   DG Chest 2 View  Result Date: 03/23/2019 CLINICAL DATA:  Left foot ulcer. Sepsis. EXAM: CHEST - 2 VIEW COMPARISON:  Aug 25, 2018. FINDINGS: The heart size and mediastinal contours are within normal limits. No pneumothorax or significant pleural effusion is noted. Status post coronary bypass graft. No acute pulmonary disease is noted. Atherosclerosis of thoracic aorta is noted. The visualized skeletal structures are unremarkable. IMPRESSION: No active cardiopulmonary disease. Aortic atherosclerosis. Electronically Signed   By: Marijo Conception M.D.   On: 03/23/2019 14:07   MR FOOT LEFT WO CONTRAST  Result Date: 03/23/2019 CLINICAL DATA:  Nonhealing ulcer along the lateral aspect of the foot. EXAM: MRI OF THE LEFT FOOT WITHOUT CONTRAST TECHNIQUE: Multiplanar, multisequence MR imaging of the 03/10/2019 was performed. No intravenous contrast was administered. COMPARISON:  Radiographs 03/10/2019 FINDINGS: Diffuse abnormal T1 and T2 signal intensity in the mid distal aspect of the fifth metatarsal and also in the proximal phalanx. There is joint space narrowing at the fifth MTP joint, small joint effusion and surrounding inflammatory phlegmon and abscess. Findings consistent with septic arthritis at the fifth MTP joint and osteomyelitis involving the fifth  metatarsal and fifth proximal phalanx. Remote partial second ray amputation. No other areas of osteomyelitis are identified. Moderate to advanced midfoot degenerative changes but no erosive findings or destructive bony changes. Diffuse subcutaneous soft tissue swelling/edema/fluid suggesting cellulitis. There is also mild diffuse myositis. IMPRESSION: 1. Septic arthritis at the fifth MTP joint with osteomyelitis involving the distal half of the fifth metatarsal and also the proximal phalanx. 2. Surrounding inflammatory phlegmon and abscess. 3. Diffuse cellulitis and myofasciitis. Electronically Signed   By: Marijo Sanes M.D.   On: 03/23/2019 12:07    Pending Labs Unresulted Labs (From admission, onward)    Start     Ordered   03/23/19 1557  SARS CORONAVIRUS 2 (TAT 6-24 HRS) Nasopharyngeal Nasopharyngeal Swab  (Tier 3 (TAT 6-24 hrs))  Once,   STAT    Question Answer Comment  Is this test for diagnosis or screening Screening   Symptomatic for COVID-19 as defined by CDC No   Hospitalized for COVID-19 No   Admitted to ICU for COVID-19 No   Previously tested for COVID-19 Yes   Resident in a congregate (group) care setting No   Employed in healthcare setting No   Pregnant No      03/23/19 1556   03/23/19 1317  Culture, blood (Routine x 2)  BLOOD CULTURE X 2,   STAT     03/23/19 1318          Vitals/Pain Today's Vitals   03/23/19 1545 03/23/19 1553 03/23/19 1700 03/23/19 1741  BP: 137/63  (!) 147/66 (!) 147/66  Pulse: 85  87   Resp:   20   Temp:      TempSrc:      SpO2: 93%  97%   PainSc:  8       Isolation Precautions No active isolations  Medications Medications  vancomycin (VANCOREADY) IVPB 1500 mg/300 mL (1,500 mg Intravenous New Bag/Given 03/23/19 1736)  vancomycin variable dose per unstable renal function (pharmacist dosing) (has no administration in time range)  heparin injection 5,000 Units (has no administration in time range)  allopurinol (ZYLOPRIM) tablet 100 mg (has  no administration in time range)  aspirin EC tablet 81 mg (81 mg Oral Given 03/23/19 1742)  HYDROcodone-acetaminophen (NORCO/VICODIN) 5-325 MG per tablet 0.5-1 tablet (has no administration in time range)  hydrALAZINE (APRESOLINE) tablet 25 mg (25 mg Oral Given 03/23/19 1741)  isosorbide dinitrate (ISORDIL) tablet 10 mg (has no administration in time range)  traZODone (DESYREL) tablet 25-100 mg (has no administration in time range)  cinacalcet (SENSIPAR) tablet 30 mg (has no administration in time range)  insulin aspart (novoLOG) injection 0-6 Units (has no administration in time range)  piperacillin-tazobactam (ZOSYN) IVPB 3.375 g (0 g Intravenous Stopped 03/23/19 1556)    Mobility walks Moderate fall risk   Focused Assessments    R Recommendations: See Admitting Provider Note  Report given to:   Additional Notes:

## 2019-03-23 NOTE — H&P (Addendum)
History and Physical    [name] DOB: Apr 12, 1945 DOA: 03/23/2019  PCP: Unk Pinto, MD   Patient coming from: Patient was sent from wound care center for of left foot infection  I have personally briefly reviewed patient's old medical records in [place]  Chief Complaint: Left foot discharge and swelling  HPI: [name] is a 73 y.o. female with medical history significant of CAD (NSTEMI 2011 s/p CABGper Dr. Cyndia Bent), chronic combined CHF/ICM, CKD stage IV ((baseline Cr around 2.7-3.1), HTN, HLD, DM, LE PAD (bilateral SFA occlusion 07/2018), foot ulcer/infection, osteomyelitis of the left toe, LBBB, arthritis, breast CA s/p lumpectomy/radiation,&gout.Repeatecho 05/04/17 showed mild LVH, EF 45-50%, diffuse HK, grade 2 DD, calcified mitral annulus with moderate MR, mildly dilated LA, normal RV, who was sent from wound care center for evaluation of worsening left foot infection.  She has chronic wound on her left foot, which she follows with wound care center every week.  She was made aware that there was a new infection which needed IV antibiotics last Wednesday.  Last Thursday she was started on vancomycin infusion on dialysis days.  He has received total of 3 doses of vancomycin since.  She also reported low-grade fever of 98.9 at home, usually her normal temperature is below 98.  Denies any pain (probably because she has neuropathy which has been left untreated), increasing discharge.  She also noted ulcer on the left shin area a week ago.  Denied any claudication, chills, no cough no chest pain, no diarrhea no dysuria.  No known Covid contacts.  Left foot MRI was done yesterday, showed septic arthritis of fifth MTP osteomyelitis surrounding abscess, cellulitis, myofasciitis.  ED Course: Physician discussed with orthopedic surgeon, who recommend broad-spectrum antibiotics, vancomycin and Zosyn were started.  Review of Systems: As per HPI otherwise 10  point review of systems negative.   Past Medical History:  Diagnosis Date  . Allergy   . Arthritis    "maybe in my lower back" (08/27/2012)  . 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 Chronic combined systolic and diastolic CHF (congestive heart failure) ([place])   . CKD (chronic kidney disease), stage IV ([place])   . Coronary artery disease    a. NSTEMI in 05/2009 s/p CABG (LIMA-LAD, SVG-diagonal, SVG-OM1/OM 2).   Marland Kitchen Dyspnea    with exertion  . Family history of anesthesia complication    Mom has a hard time to wake up  . Foot ulcer ([place])    "I've had them on both feet" (08/27/2012)  . Gouty arthritis    "right index finger" (08/27/2012)  . History of radiation therapy 06/09/13-07/06/13   left breast 50Gy  . Hyperlipidemia   . Hypertension   . Infection    right second toe  . Ischemic cardiomyopathy    a. 2011: EF 40-45%. b. EF 55-60% in 04/2017 but shortly after as inpatient was 45-50%.  Marland Kitchen LBBB (left bundle branch block)   . Mitral regurgitation   . Neuropathy    Hx; of B/L feet  . NSTEMI (non-ST elevated myocardial infarction) ([place]) 05/23/2009  . Osteomyelitis of foot ([place])   . PAD (peripheral artery disease) ([facility])    a. LE PAD (patient previously elected hold off L fem-pop), prev followed by Dr. Bridgett Larsson  . PONV (postoperative nausea and vomiting)   . Sinus headache    "occasionally" (08/27/2012)  . Type II diabetes mellitus ([place])   . Vitamin D deficiency  Past Surgical History:  Procedure Laterality Date  . AMPUTATION Right 08/27/2012   Procedure: AMPUTATION RAY;  Surgeon: Newt Minion, MD;  Location: [facility];  Service: Orthopedics;  Laterality: Right;  Right Foot 5th Ray Amputation  . AMPUTATION Left 07/08/2013   Procedure: AMPUTATION RAY;  Surgeon: Newt Minion, MD;  Location: [facility];  Service: Orthopedics;  Laterality: Left;  Left Foot 2nd Ray Amputation  . AMPUTATION RAY Right 08/27/2012   5th ray/notes 08/27/2012  . AMPUTATION TOE  Right 03/06/2017   Procedure: AMPUTATION TOE, INTERPHANGEAL 2ND RIGHT;  Surgeon: [name], DPM;  Location: [facility];  Service: Podiatry;  Laterality: Right;  . AV FISTULA PLACEMENT Right 11/11/2017   Procedure: RIGHT RADIOCEPHALIC  ARTERIOVENOUS FISTULA;  Surgeon: [name], MD;  Location: [facility];  Service: Vascular;  Laterality: Right;  . BREAST LUMPECTOMY  04/20/2013   with biopsy      DR WAKEFIELD  . BREAST LUMPECTOMY WITH NEEDLE LOCALIZATION AND AXILLARY SENTINEL LYMPH NODE BX Left 04/20/2013   Procedure: LEFT BREAST WIRE GUIDED LUMPECTOMY AND AXILLARY SENTINEL LYMPH NODE BX;  Surgeon: Rolm Bookbinder, MD;  Location: [facility];  Service: General;  Laterality: Left;  . CARDIAC CATHETERIZATION  05/24/2009   Archie Endo 05/24/2009 (08/27/2012)  . CATARACT EXTRACTION W/ INTRAOCULAR LENS  IMPLANT, BILATERAL  2000's  . COLONOSCOPY W/ BIOPSIES AND POLYPECTOMY  2010  . CORONARY ARTERY BYPASS GRAFT  2011   "CABG X4" (08/27/2012)  . DENTAL SURGERY  04/30/11   "1 implant" (08/27/2012)  . DILATION AND CURETTAGE OF UTERUS    . EYE SURGERY    . FINGER SURGERY Right    "index finger; turned out to be gout" (08/27/2012)  . IR FLUORO GUIDE CV LINE RIGHT  11/30/2017  . TEE WITHOUT CARDIOVERSION N/A 12/01/2017   Procedure: TRANSESOPHAGEAL ECHOCARDIOGRAM (TEE);  Surgeon: Lelon Perla, MD;  Location: [facility] ENDOSCOPY;  Service: Cardiovascular;  Laterality: N/A;     reports that she has quit smoking. She has never used smokeless tobacco. She reports current alcohol use. She reports that she does not use drugs.  Allergies  Allergen Reactions  . Atenolol Other (See Comments) and Rash    Exacerbates gout Exacerbates gout  . Adhesive [Tape] Other (See Comments)    SKIN IS VERY SENSITIVE AND BRUISES AND TEARS EASILY; PLEASE USE AN ALTERNATIVE TO TAPE!!  . Contrast Media [Iodinated Diagnostic Agents] Rash  . Iohexol Rash and Other (See Comments)  . Latex Rash    Family History  Problem Relation Age of Onset    . Kidney cancer Brother 2  . Hypertension Brother   . Breast cancer Sister 63       LCIS; BRCA negative  . Throat cancer Father 86       smoker  . Breast cancer Sister 85       Lobular breast cancer  . Breast cancer Maternal Aunt        dx in her 27s     Prior to Admission medications   Medication Sig Start Date End Date Taking? Authorizing Provider  allopurinol (ZYLOPRIM) 100 MG tablet Take 1 tablet 2 x /day to Prevent Gout Patient taking differently: Take 100 mg by mouth 2 (two) times daily.  10/27/18  Yes Unk Pinto, MD  aspirin EC 81 MG tablet Take 1 tablet (81 mg total) by mouth daily. 05/18/17  Yes Burtis Junes, NP  Aspirin-Acetaminophen-Caffeine (EXCEDRIN EXTRA STRENGTH PO) Take 2 tablets by mouth daily as needed (pain).  Yes [provider]  b complex-vitamin c-folic acid (NEPHRO-VITE) 0.8 MG TABS tablet Take 1 tablet by mouth 3 (three) times a week. Tuesday, Thursday, Saturday 03/19/18  Yes [provider]  cinacalcet (SENSIPAR) 30 MG tablet Take 30 mg by mouth 3 (three) times a week. Takes 1 tablet 3 days a week. Harrell Lark, Sat   Yes [provider]  docusate sodium (COLACE) 100 MG capsule Take 100 mg by mouth at bedtime.   Yes [provider]  fluticasone (FLONASE) 50 MCG/ACT nasal spray Place 2 sprays into both nostrils daily as needed for allergies or rhinitis.   Yes [provider]  hydrALAZINE (APRESOLINE) 25 MG tablet Take 3 tablets (75 mg) 3 x /day for BP & Heart 10/27/18  Yes Unk Pinto, MD  HYDROcodone-acetaminophen (NORCO/VICODIN) 5-325 MG tablet Take 0.5-1 tablets by mouth every 6 (six) hours as needed for severe pain. 03/01/19  Yes Liane Comber, NP  insulin NPH-regular Human (70-30) 100 UNIT/ML injection Inject 10-30 Units into the skin 2 (two) times daily with a meal.   Yes [provider]  isosorbide dinitrate (ISORDIL) 10 MG tablet Take 1 tablet 3 x /day for BP & Heart Patient taking  differently: Take 10 mg by mouth 3 (three) times daily. Take 1 tablet 3 x /day for BP & Heart 10/27/18  Yes Unk Pinto, MD  loratadine (CLARITIN) 10 MG tablet Take 10 mg by mouth daily.   Yes [provider]  Multiple Vitamins-Minerals (PRESERVISION AREDS 2 PO) Take 2 capsules by mouth daily.   Yes [provider]  Probiotic Product (PROBIOTIC DAILY PO) Take 1 tablet by mouth daily.   Yes [provider]  psyllium (METAMUCIL) 58.6 % powder Take 1 packet by mouth 3 (three) times daily.   Yes [provider]  sevelamer carbonate (RENVELA) 800 MG tablet Take 1,600 mg by mouth 3 (three) times daily with meals.  04/20/18  Yes [provider]  traZODone (DESYREL) 50 MG tablet 1/2-2 tablets 1 hour prior to bedtime for sleep. Patient taking differently: Take 25-100 mg by mouth at bedtime. 1/2-2 tablets 1 hour prior to bedtime for sleep. 03/01/19  Yes Liane Comber, NP  BD VEO INSULIN SYRINGE U/F 31G X 15/64" 1 ML MISC USE AS DIRECTED WITH INSULIN 02/06/18   Unk Pinto, MD  glucose blood (PRODIGY NO CODING BLOOD GLUC) test strip CHECK SUGARS 3 TIMES A DAY.[id number] 02/08/18   Unk Pinto, MD    Physical Exam: Vitals:   03/23/19 1545 03/23/19 1700 03/23/19 1741 03/23/19 1819  BP: 137/63 (!) 147/66 (!) 147/66 (!) 142/61  Pulse: 85 87  85  Resp:  20  18  Temp:    98 F (36.7 C)  TempSrc:      SpO2: 93% 97%  98%    Constitutional: NAD, calm, comfortable Vitals:   03/23/19 1545 03/23/19 1700 03/23/19 1741 03/23/19 1819  BP: 137/63 (!) 147/66 (!) 147/66 (!) 142/61  Pulse: 85 87  85  Resp:  20  18  Temp:    98 F (36.7 C)  TempSrc:      SpO2: 93% 97%  98%   Eyes: PERRL, lids and conjunctivae normal ENMT: Mucous membranes are moist. Posterior pharynx clear of any exudate or lesions.Normal dentition.  Neck: normal, supple, no masses, no thyromegaly Respiratory: clear to auscultation bilaterally, no wheezing, no crackles. Normal  respiratory effort. No accessory muscle use.  Cardiovascular: Regular rate and rhythm, no murmurs / rubs / gallops. No extremity edema.  2+ pedal pulses. No carotid bruits.  Abdomen: no tenderness, no masses palpated. No hepatosplenomegaly. Bowel sounds positive.  Musculoskeletal: no clubbing / cyanosis. No joint deformity upper and lower extremities. Good ROM, no contractures. Normal muscle tone.  Skin: Ulcer on the left lateral foot, with pus coming out from the bottom, 3 x 6 ulcer noticed on the right shin area. Neurologic: CN 2-12 grossly intact. Sensation intact, DTR normal. Strength 5/5 in all 4.  Psychiatric: Normal judgment and insight. Alert and oriented x 3. Normal mood.     Labs on Admission: I have personally reviewed following labs and imaging studies  CBC: Recent Labs  Lab 03/23/19 1355  WBC 10.7*  NEUTROABS 8.0*  HGB 10.8*  HCT 33.1*  MCV 94.6  PLT 841   Basic Metabolic Panel: Recent Labs  Lab 03/23/19 1355  NA 138  K 4.1  CL 98  CO2 27  GLUCOSE 70  BUN 40*  CREATININE 3.40*  CALCIUM 8.4*   GFR: CrCl cannot be calculated (Unknown ideal weight.). Liver Function Tests: Recent Labs  Lab 03/23/19 1355  AST 22  ALT 23  ALKPHOS 192*  BILITOT 0.5  PROT 5.9*  ALBUMIN 2.8*   No results for input(s): LIPASE, AMYLASE in the last 168 hours. No results for input(s): AMMONIA in the last 168 hours. Coagulation Profile: Recent Labs  Lab 03/23/19 1355  INR 1.2   Cardiac Enzymes: No results for input(s): CKTOTAL, CKMB, CKMBINDEX, TROPONINI in the last 168 hours. BNP (last 3 results) No results for input(s): PROBNP in the last 8760 hours. HbA1C: No results for input(s): HGBA1C in the last 72 hours. CBG: Recent Labs  Lab 03/23/19 1820  GLUCAP 92   Lipid Profile: No results for input(s): CHOL, HDL, LDLCALC, TRIG, CHOLHDL, LDLDIRECT in the last 72 hours. Thyroid Function Tests: No results for input(s): TSH, T4TOTAL, FREET4, T3FREE, THYROIDAB in the last  72 hours. Anemia Panel: No results for input(s): VITAMINB12, FOLATE, FERRITIN, TIBC, IRON, RETICCTPCT in the last 72 hours. Urine analysis:    Component Value Date/Time   COLORURINE YELLOW 01/10/2019 [place] 01/10/2019 1525   LABSPEC 1.019 01/10/2019 1525   PHURINE 7.5 01/10/2019 1525   GLUCOSEU TRACE (A) 01/10/2019 1525   HGBUR NEGATIVE 01/10/2019 1525   BILIRUBINUR NEGATIVE 08/25/2018 0045   KETONESUR NEGATIVE 01/10/2019 1525   PROTEINUR 3+ (A) 01/10/2019 1525   UROBILINOGEN 0.2 05/30/2009 1500   NITRITE NEGATIVE 01/10/2019 1525   LEUKOCYTESUR NEGATIVE 01/10/2019 1525    Radiological Exams on Admission: DG Chest 2 View  Result Date: 03/23/2019 CLINICAL DATA:  Left foot ulcer. Sepsis. EXAM: CHEST - 2 VIEW COMPARISON:  Aug 25, 2018. FINDINGS: The heart size and mediastinal contours are within normal limits. No pneumothorax or significant pleural effusion is noted. Status post coronary bypass graft. No acute pulmonary disease is noted. Atherosclerosis of thoracic aorta is noted. The visualized skeletal structures are unremarkable. IMPRESSION: No active cardiopulmonary disease. Aortic atherosclerosis. Electronically Signed   By: Marijo Conception M.D.   On: 03/23/2019 14:07   MR FOOT LEFT WO CONTRAST  Result Date: 03/23/2019 CLINICAL DATA:  Nonhealing ulcer along the lateral aspect of the foot. EXAM: MRI OF THE LEFT FOOT WITHOUT CONTRAST TECHNIQUE: Multiplanar, multisequence MR imaging of the 03/10/2019 was performed. No intravenous contrast was administered. COMPARISON:  Radiographs 03/10/2019 FINDINGS: Diffuse abnormal T1 and T2 signal intensity in the mid distal aspect of the fifth metatarsal and also in the proximal phalanx. There is joint space narrowing  at the fifth MTP joint, small joint effusion and surrounding inflammatory phlegmon and abscess. Findings consistent with septic arthritis at the fifth MTP joint and osteomyelitis involving the fifth metatarsal and  fifth proximal phalanx. Remote partial second ray amputation. No other areas of osteomyelitis are identified. Moderate to advanced midfoot degenerative changes but no erosive findings or destructive bony changes. Diffuse subcutaneous soft tissue swelling/edema/fluid suggesting cellulitis. There is also mild diffuse myositis. IMPRESSION: 1. Septic arthritis at the fifth MTP joint with osteomyelitis involving the distal half of the fifth metatarsal and also the proximal phalanx. 2. Surrounding inflammatory phlegmon and abscess. 3. Diffuse cellulitis and myofasciitis. Electronically Signed   By: Marijo Sanes M.D.   On: 03/23/2019 12:07    EKG: Independently reviewed.   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  Orthopedic Surgery will evaluate patient and decide whether patient will need amputation of fifth toe and toe amputation of the fifth metatarsal, consider infection disease consultation if long-term antibiotics indicated.  Continue vancomycin and Zosyn for now.  PVD with poorly healing left foot ulcer and osteomyelitis and shin ulcer, discussed with vascular surgeon Dr. Carlis Abbott, reviewed with him regarding since most recent Doppler study which showed occlusion of bilateral SFA.  Vascular surgeon recommend further studies, likely will coordinated with nephrology if IV contrast needed.  IDDM, Her recent A1c 6.3, she does have occasional hypoglycemia symptoms at home current regimen of 70/30, recommend her to see endocrinologist, but patient declined.  We will start her on sliding scale for now.  DM neuropathy, patient has symptoms of neuropathy but she has been hesitant to start any treatment.  ESRD on hemodialysis, discussed with on-call nephrologist Dr. Posey Pronto, continue TTS schedule.  Hypertension, continue home regimen with  parameters.  Systolic CHF chronic, no symptoms or signs of exacerbation, no additional diuresis indicated.  Gout, continue allopurinol.  DVT prophylaxis: Heparin subcu, Hold for Tonight, in case patient go to OR tomorrow. Code Status: Full code Family Communication: None at the bedside Disposition Plan: Depends on surgery plan and vascular work-up Consults called: Dr. Posey Pronto from nephrology, Dr. Carlis Abbott from vascular surgery, orthopedic surgery was contacted by ED physician Admission status: Inpatient.   Lequita Halt MD [facility] Pager [phone]  If 7PM-7AM, please contact night-coverage [email or web address] Password [facility]  03/23/2019, 6:39 PM

## 2019-03-23 NOTE — Progress Notes (Addendum)
Pharmacy Antibiotic Note  [name] is a 73 y.o. female admitted on 03/23/2019 with foot wound with imaging showing septic arthritis with osteomyelitis, surrounding inflammatory phlegmon and abscess, diffuse cellulitis and myofascitis. Pharmacy has been consulted for vancomycin dosing. Also has Zosyn ordered x 1 dose per EDP. ESRD on HD TTSa - last session 12/15 on schedule PTA. Recent weight documented as 162 lbs from 03/01/19.  Wound culture from 12/9 at [facility] grew few serratia marcescens.  Per Dr. Posey Pronto, he will wait to enter HD orders until after her surgery timing is confirmed.  Plan: Vancomycin 1500mg  IV x 1; 750mg  IV qHD Zosyn 3.375g IV (47min infusion) x 1 - f/u if to continue Monitor clinical progress, c/s, abx plan/LOT Pre-HD vancomycin level as indicated F/u HD schedule/tolerance inpatient to enter vancomycin maintenance doses      Temp (24hrs), Avg:99 F (37.2 C), Min:99 F (37.2 C), Max:99 F (37.2 C)  Recent Labs  Lab 03/23/19 1338 03/23/19 1355  WBC  --  10.7*  CREATININE  --  3.40*  LATICACIDVEN 1.1  --     CrCl cannot be calculated (Unknown ideal weight.).    Allergies  Allergen Reactions  . Atenolol Other (See Comments) and Rash    Exacerbates gout Exacerbates gout  . Adhesive [Tape] Other (See Comments)    SKIN IS VERY SENSITIVE AND BRUISES AND TEARS EASILY; PLEASE USE AN ALTERNATIVE TO TAPE!!  . Contrast Media [Iodinated Diagnostic Agents] Rash  . Iohexol Rash and Other (See Comments)  . Latex Rash    Elicia Lamp, PharmD, BCPS Please check AMION for all [place] contact numbers Clinical Pharmacist 03/23/2019 3:23 PM

## 2019-03-23 NOTE — Progress Notes (Addendum)
EVON, DEJARNETT ([id number]) Visit Report for 03/23/2019 Chief Complaint Document Details Patient Name: Date of Service: YOMARIS, PALECEK 03/23/2019 10:45 AM Medical Record [id number] Patient Account Number: [id number] Date of Birth/Sex: Treating RN: 1945/04/23 (73 y.o. [name] Primary Care Provider: [name] Other Clinician: Referring Provider: Treating Provider/Extender:Stone III, Lemar Livings, Lendon Ka Weeks in Treatment: 4 Information Obtained from: Patient Chief Complaint Bilateral foot and bilateral LE ulcers Electronic Signature(s) Signed: 03/23/2019 11:29:27 AM By: Worthy Keeler PA-C Entered By: Worthy Keeler on 03/23/2019 11:29:26 -------------------------------------------------------------------------------- Debridement Details Patient Name: Date of Service: [name] 03/23/2019 10:45 AM Medical Record [id number] Patient Account Number: [id number] Date of Birth/Sex: Nov 11, 1945 (73 y.o. F) Treating RN: [name] Primary Care Provider: [name] Other Clinician: Referring Provider: Treating Provider/Extender:Stone III, Lemar Livings, Lendon Ka Weeks in Treatment: 4 Debridement Performed for Wound #7 Left,Lateral Metatarsal head fifth Assessment: Performed By: Physician Worthy Keeler, PA Debridement Type: Debridement Severity of Tissue Pre Fat layer exposed Debridement: Level of Consciousness (Pre- Awake and Alert procedure): Pre-procedure Verification/Time Out Taken: Yes - 11:44 Start Time: 11:44 Total Area Debrided (L x W): 1.7 (cm) x 2.3 (cm) = 3.91 (cm) Tissue and other material Viable, Non-Viable, Callus, Slough, Subcutaneous, Slough debrided: Level: Skin/Subcutaneous Tissue Debridement Description: Excisional Instrument: Curette Bleeding: Minimum Hemostasis Achieved: Pressure End Time: 11:46 Procedural Pain: 0 Post Procedural Pain: 0 Response to Treatment: Procedure was tolerated  well Level of Consciousness Awake and Alert (Post-procedure): Post Debridement Measurements of Total Wound Length: (cm) 1.7 Width: (cm) 2.3 Depth: (cm) 0.9 Volume: (cm) 2.764 Character of Wound/Ulcer Post Requires Further Debridement Debridement: Severity of Tissue Post Debridement: Fat layer exposed Post Procedure Diagnosis Same as Pre-procedure Electronic Signature(s) Signed: 03/23/2019 5:37:18 PM By: Worthy Keeler PA-C Signed: 03/24/2019 5:32:06 PM By: [name] RN, BSN Entered By: [name] on 03/23/2019 11:46:21 -------------------------------------------------------------------------------- HPI Details Patient Name: Date of Service: [name] 03/23/2019 10:45 AM Medical Record [id number] Patient Account Number: [id number] Date of Birth/Sex: Treating RN: [birth year] (73 y.o. [name] Primary Care Provider: [name] Other Clinician: Referring Provider: Treating Provider/Extender:Stone III, Lemar Livings, Lendon Ka Weeks in Treatment: 4 History of Present Illness HPI Description: 02/23/2019 on evaluation today patient presents for initial evaluation in our clinic concerning issues that she has been having with her bilateral lower extremities as well as her plantar foot on the right. This has been quite some time in regard to the foot ulcer even back as far as the beginning of this year that is 2020. She has had the wounds on her legs a much shorter time but that still has been roughly 2-3 months. Nonetheless she is really not shown signs of a lot of improvement. She tells me that she does have a past medical history of diabetes, venous insufficiency, lymphedema which is mild but nonetheless present, peripheral vascular disease and she has previously been recommended to have a femoral-popliteal bypass but put that off at that point they are basically just monitoring at this time. She has end-stage renal disease with dependence on  renal dialysis, hypertension, and obviously wounds of the bilateral lower extremities at this point. She tells me that she is having some discomfort but fortunately nothing too significant at this point which is good news. No fevers, chills, nausea, vomiting, or diarrhea. She is mainly been leaving these areas open to air as much as possible at this point at 1 point she was trying to keep them covered  more but that really did not help either. Fortunately there is no signs of active systemic nor local infection at this time. 03/02/2019 on evaluation today patient appears to be doing well with regard to her wounds. The leg ulcers which were very dry are much more moist at this point and seem to be doing great at this time. Overall I am very pleased with how things seem to be progressing. With regard to her foot ulcer it is showing some signs of slight improvement in my opinion overall there does not appear to be anything worse at least which is also good news. No fevers, chills, nausea, vomiting, or diarrhea. 03/09/2019 on evaluation today patient appears to be doing better with regard to her lower extremity ulcers a lot of the necrotic tissue is loosening up which is good news. In regard to her right plantar foot ulcer this appears to be roughly the same based on what I am seeing currently although there is less callus buildup. Unfortunately she does have an open wound area on the left lateral foot region which unfortunately probes down to bone once this was thoroughly examined. This is something she did not even know was opened she had had a Band-Aid over it just for protection last couple times I saw her. 03/16/19 evaluation today patient appears to be doing Someone poorly in regard to her left lower extremity. This seems to be likely infected based on what Im seeing at this point. Theres no signs of active infection at this time systemically but nonetheless theres a lot more erythema of the foot  in general unfortunately. Her x-rays were reviewed and did not show any evidence of infection of the right foot. On the left there was a A little bit more going on but no obvious evidence of osteomyelitis though it was mentioned that if that was still a concern that number I would be the best way to further evaluate this. I think they were going to need to proceed with MRI to be honest based on what Im seeing today. 03/23/2019 on evaluation today patient actually appears to be doing worse compared to even last week's visit with regard to her wounds especially the left foot but even the right lower extremity. Both are measuring deeper as far as the overall depth of the wound on the left foot this also seems to have spread as far as the open wound location. It also does appear to me that the cellulitis has spread further up her leg which also has me very concerned at this point. There fortunately is no signs of active infection at this point systemically such as evidence of sepsis but nonetheless I am still concerned about the fact that again the patient does not seem to be turning around. We did have her on IV vancomycin through dialysis which they graciously help this with as well. With that being said it appears based on her culture results that I did review today that cefepime or something of the like would likely be a better option the vancomycin is not likely to help with the Serratia species that was identified. Electronic Signature(s) Signed: 03/23/2019 11:56:35 AM By: Worthy Keeler PA-C Entered By: Worthy Keeler on 03/23/2019 11:56:34 -------------------------------------------------------------------------------- Physical Exam Details Patient Name: Date of Service: AAIMA, GADDIE 03/23/2019 10:45 AM Medical Record [id number] Patient Account Number: [id number] Date of Birth/Sex: Treating RN: 09-Jul-1945 (73 y.o. [name] Primary Care Provider: [name] Other Clinician: Referring Provider: Treating  Provider/Extender:Stone III, Lemar Livings, Lendon Ka Weeks in Treatment: 4 Constitutional Well-nourished and well-hydrated in no acute distress. Respiratory normal breathing without difficulty. clear to auscultation bilaterally. Cardiovascular regular rate and rhythm with normal S1, S2. Psychiatric this patient is able to make decisions and demonstrates good insight into disease process. Alert and Oriented x 3. pleasant and cooperative. Notes Patient's wound bed currently showed signs on the left foot of actually being larger she had some callus and blistering on the plantar aspect of her foot which actually connects to the lateral portion and this indeed is one continuous wound. This is worse than what we noted last week and in general I feel like this is continuing to worsen or not improve. With that being said I do believe that the patient is unfortunately having a lot of difficulty with cellulitis which now is spreading further up her left lower extremity and again that has me concerned again about the possibility of sepsis if we do not get things under control much more quickly. For that reason and considering the fact that she is getting worse not better I am recommending she go to the ER for further evaluation and treatment today I foresee that she will likely be admitted. Electronic Signature(s) Signed: 03/23/2019 11:57:45 AM By: Worthy Keeler PA-C Entered By: Worthy Keeler on 03/23/2019 11:57:44 -------------------------------------------------------------------------------- Physician Orders Details Patient Name: Date of Service: CHRISSI, CROW 03/23/2019 10:45 AM Medical Record [id number] Patient Account Number: [id number] Date of Birth/Sex: Treating RN: 1945/08/03 (73 y.o. 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 of L97.526 necrosis I87.2 Venous insufficiency (chronic) (peripheral) I89.0 Lymphedema, not elsewhere classified L97.812 Non-pressure chronic ulcer of other part of right lower leg with fat layer exposed L97.822 Non-pressure chronic ulcer of other part of left lower leg with fat layer exposed I73.89 Other specified peripheral vascular diseases N18.6 End stage renal disease Z99.2 Dependence on renal dialysis I10 Essential (primary) hypertension Follow-up Appointments Return Appointment in: - Call to schedule appointment after discharge from hospital Dressing Change Frequency Wound #2 Right,Plantar Foot Do not change entire dressing for one week. Wound #4 Right,Medial Lower Leg Do not change entire dressing for one week. Wound #5 Left,Medial Lower Leg Do not change entire dressing for one week. Wound #6 Left,Proximal,Medial Lower Leg Do not change entire dressing for one week. Skin Barriers/Peri-Wound Care Moisturizing lotion - both legs Wound Cleansing May shower with protection. Primary Wound Dressing Wound #2 Right,Plantar Foot Calcium Alginate with Silver - pack lightly into wound Wound #7 Left,Lateral Metatarsal head fifth Calcium Alginate with Silver - pack lightly into wound Wound #4 Right,Medial Lower Leg Calcium Alginate with Silver Wound #5 Left,Medial Lower Leg Calcium Alginate with Silver Wound #6 Left,Proximal,Medial Lower Leg Calcium Alginate with Silver Secondary Dressing Wound #2 Right,Plantar Foot Foam - foam donut Kerlix/Rolled Gauze Dry Gauze Drawtex - cut to fit inside wound edges to hold collagen in  place Wound #4 Right,Medial Lower Leg Kerlix/Rolled Gauze Dry Gauze ABD pad Wound #5 Left,Medial Lower Leg Kerlix/Rolled Gauze Dry Gauze ABD pad Wound #6 Left,Proximal,Medial Lower Leg Kerlix/Rolled Gauze Dry Gauze ABD pad Edema Control Avoid  standing for long periods of time Elevate legs to the level of the heart or above for 30 minutes daily and/or when sitting, a frequency of: - whenever sitting Exercise regularly Electronic Signature(s) Signed: 03/23/2019 5:37:18 PM By: Worthy Keeler PA-C Signed: 03/24/2019 5:32:06 PM By: [name] RN, BSN Entered By: [name] on 03/23/2019 11:54:56 -------------------------------------------------------------------------------- Problem List Details Patient Name: Date of Service: [name] 03/23/2019 10:45 AM Medical Record [id number] Patient Account Number: [id number] Date of Birth/Sex: Treating RN: November 06, 1945 (73 y.o. [name] Primary Care Provider: [name] Other Clinician: Referring Provider: Treating Provider/Extender:Stone III, Lemar Livings, Lendon Ka Weeks in Treatment: 4 Active Problems ICD-10 Evaluated Encounter Code Description Active Date Today Diagnosis E11.621 Type 2 diabetes mellitus with foot ulcer 02/23/2019 No Yes L97.512 Non-pressure chronic ulcer of other part of right foot 02/23/2019 No Yes with fat layer exposed L97.526 Non-pressure chronic ulcer of other part of left foot 03/09/2019 No Yes with bone involvement without evidence of necrosis I87.2 Venous insufficiency (chronic) (peripheral) 02/23/2019 No Yes I89.0 Lymphedema, not elsewhere classified 02/23/2019 No Yes L97.812 Non-pressure chronic ulcer of other part of right lower 02/23/2019 No Yes leg with fat layer exposed L97.822 Non-pressure chronic ulcer of other part of left lower 02/23/2019 No Yes leg with fat layer exposed I73.89 Other specified peripheral vascular diseases 02/23/2019 No Yes N18.6 End stage  renal disease 02/23/2019 No Yes Z99.2 Dependence on renal dialysis 02/23/2019 No Yes I10 Essential (primary) hypertension 02/23/2019 No Yes Inactive Problems Resolved Problems Electronic Signature(s) Signed: 03/23/2019 11:29:13 AM By: Worthy Keeler PA-C Entered By: Worthy Keeler on 03/23/2019 11:29:12 -------------------------------------------------------------------------------- Progress Note Details Patient Name: Date of Service: [name] 03/23/2019 10:45 AM Medical Record [id number] Patient Account Number: [id number] Date of Birth/Sex: Treating RN: 05/21/1945 (73 y.o. [name] Primary Care Provider: [name] Other Clinician: Referring Provider: Treating Provider/Extender:Stone III, Lemar Livings, Lendon Ka Weeks in Treatment: 4 Subjective Chief Complaint Information obtained from Patient Bilateral foot and bilateral LE ulcers History of Present Illness (HPI) 02/23/2019 on evaluation today patient presents for initial evaluation in our clinic concerning issues that she has been having with her bilateral lower extremities as well as her plantar foot on the right. This has been quite some time in regard to the foot ulcer even back as far as the beginning of this year that is 2020. She has had the wounds on her legs a much shorter time but that still has been roughly 2-3 months. Nonetheless she is really not shown signs of a lot of improvement. She tells me that she does have a past medical history of diabetes, venous insufficiency, lymphedema which is mild but nonetheless present, peripheral vascular disease and she has previously been recommended to have a femoral-popliteal bypass but put that off at that point they are basically just monitoring at this time. She has end-stage renal disease with dependence on renal dialysis, hypertension, and obviously wounds of the bilateral lower extremities at this point. She tells me that she is having some  discomfort but fortunately nothing too significant at this point which is good news. No fevers, chills, nausea, vomiting, or diarrhea. She is mainly been leaving these areas open to air as much as possible at this point at 1 point she was trying to keep them covered more but that really did not help either. Fortunately there is no signs of active systemic nor local infection at this time. 03/02/2019 on evaluation today patient appears to be  doing well with regard to her wounds. The leg ulcers which were very dry are much more moist at this point and seem to be doing great at this time. Overall I am very pleased with how things seem to be progressing. With regard to her foot ulcer it is showing some signs of slight improvement in my opinion overall there does not appear to be anything worse at least which is also good news. No fevers, chills, nausea, vomiting, or diarrhea. 03/09/2019 on evaluation today patient appears to be doing better with regard to her lower extremity ulcers a lot of the necrotic tissue is loosening up which is good news. In regard to her right plantar foot ulcer this appears to be roughly the same based on what I am seeing currently although there is less callus buildup. Unfortunately she does have an open wound area on the left lateral foot region which unfortunately probes down to bone once this was thoroughly examined. This is something she did not even know was opened she had had a Band-Aid over it just for protection last couple times I saw her. 03/16/19 evaluation today patient appears to be doing Someone poorly in regard to her left lower extremity. This seems to be likely infected based on what Ioom seeing at this point. Thereoos no signs of active infection at this time systemically but nonetheless thereoos a lot more erythema of the foot in general unfortunately. Her x-rays were reviewed and did not show any evidence of infection of the right foot. On the left there  was a A little bit more going on but no obvious evidence of osteomyelitis though it was mentioned that if that was still a concern that number I would be the best way to further evaluate this. I think they were going to need to proceed with MRI to be honest based on what Ioom seeing today. 03/23/2019 on evaluation today patient actually appears to be doing worse compared to even last week's visit with regard to her wounds especially the left foot but even the right lower extremity. Both are measuring deeper as far as the overall depth of the wound on the left foot this also seems to have spread as far as the open wound location. It also does appear to me that the cellulitis has spread further up her leg which also has me very concerned at this point. There fortunately is no signs of active infection at this point systemically such as evidence of sepsis but nonetheless I am still concerned about the fact that again the patient does not seem to be turning around. We did have her on IV vancomycin through dialysis which they graciously help this with as well. With that being said it appears based on her culture results that I did review today that cefepime or something of the like would likely be a better option the vancomycin is not likely to help with the Serratia species that was identified. Patient History Information obtained from Patient. Family History Cancer - Father,Siblings, Diabetes - Maternal Grandparents,Paternal Grandparents,Mother,Father,Siblings, Heart Disease - Maternal Grandparents,Paternal Grandparents,Mother,Father,Siblings, Hypertension - Maternal Grandparents,Paternal Grandparents, Kidney Disease - Siblings, No family history of Hereditary Spherocytosis, Lung Disease, Seizures, Stroke, Thyroid Problems, Tuberculosis. Social History Never smoker, Marital Status - Married, Alcohol Use - Never, Drug Use - No History, Caffeine Use - Rarely. Medical History Eyes Patient has  history of Cataracts - removed Cardiovascular Patient has history of Congestive Heart Failure, Coronary Artery Disease, Hypertension, Peripheral Arterial Disease, Peripheral Venous Disease  Endocrine Patient has history of Type II Diabetes Genitourinary Patient has history of End Stage Renal Disease - Hemodialysis 3x/week Integumentary (Skin) Denies history of History of Burn Musculoskeletal Patient has history of Osteoarthritis Neurologic Patient has history of Neuropathy Oncologic Patient has history of Received Radiation Medical And Surgical History Notes Cardiovascular Hyperlipidemia, Cardiomyopathy Oncologic Breast cancer 2014 with lumpectomy Review of Systems (ROS) Constitutional Symptoms (General Health) Denies complaints or symptoms of Fatigue, Fever, Chills, Marked Weight Change. Respiratory Denies complaints or symptoms of Chronic or frequent coughs, Shortness of Breath. Cardiovascular Denies complaints or symptoms of Chest pain. Psychiatric Denies complaints or symptoms of Claustrophobia, Suicidal. Objective Constitutional Well-nourished and well-hydrated in no acute distress. Vitals Time Taken: 10:48 AM, Height: 65 in, Weight: 162 lbs, BMI: 27, Temperature: 98.9 F, Pulse: 93 bpm, Respiratory Rate: 20 breaths/min, Blood Pressure: 145/60 mmHg, Capillary Blood Glucose: 115 mg/dl. Respiratory normal breathing without difficulty. clear to auscultation bilaterally. Cardiovascular regular rate and rhythm with normal S1, S2. Psychiatric this patient is able to make decisions and demonstrates good insight into disease process. Alert and Oriented x 3. pleasant and cooperative. General Notes: Patient's wound bed currently showed signs on the left foot of actually being larger she had some callus and blistering on the plantar aspect of her foot which actually connects to the lateral portion and this indeed is one continuous wound. This is worse than what we noted last  week and in general I feel like this is continuing to worsen or not improve. With that being said I do believe that the patient is unfortunately having a lot of difficulty with cellulitis which now is spreading further up her left lower extremity and again that has me concerned again about the possibility of sepsis if we do not get things under control much more quickly. For that reason and considering the fact that she is getting worse not better I am recommending she go to the ER for further evaluation and treatment today I foresee that she will likely be admitted. Integumentary (Hair, Skin) Wound #2 status is Open. Original cause of wound was Gradually Appeared. The wound is located on the [place]. The wound measures 0.5cm length x 0.8cm width x 0.5cm depth; 0.314cm^2 area and 0.157cm^3 volume. There is Fat Layer (Subcutaneous Tissue) Exposed exposed. There is no tunneling or undermining noted. There is a medium amount of serosanguineous drainage noted. The wound margin is thickened. There is large (67-100%) red granulation within the wound bed. There is no necrotic tissue within the wound bed. Wound #4 status is Open. Original cause of wound was Blister. The wound is located on the Right,Medial Lower Leg. The wound measures 4cm length x 3.9cm width x 0.1cm depth; 12.252cm^2 area and 1.225cm^3 volume. There is Fat Layer (Subcutaneous Tissue) Exposed exposed. There is no tunneling or undermining noted. There is a medium amount of serosanguineous drainage noted. The wound margin is flat and intact. There is medium (34-66%) red, pink, hyper - granulation within the wound bed. There is a medium (34-66%) amount of necrotic tissue within the wound bed including Adherent Slough. Wound #5 status is Open. Original cause of wound was Blister. The wound is located on the Left,Medial Lower Leg. The wound measures 2.2cm length x 1.5cm width x 0.1cm depth; 2.592cm^2 area and 0.259cm^3 volume.  There is Fat Layer (Subcutaneous Tissue) Exposed exposed. There is no tunneling or undermining noted. There is a medium amount of serosanguineous drainage noted. The wound margin is flat and intact. There is small (  1-33%) pink granulation within the wound bed. There is a large (67-100%) amount of necrotic tissue within the wound bed including Adherent Slough. Wound #6 status is Open. Original cause of wound was Blister. The wound is located on the Left,Proximal,Medial Lower Leg. The wound measures 0.7cm length x 0.4cm width x 0.1cm depth; 0.22cm^2 area and 0.022cm^3 volume. There is Fat Layer (Subcutaneous Tissue) Exposed exposed. There is no tunneling or undermining noted. There is a medium amount of serosanguineous drainage noted. The wound margin is flat and intact. There is small (1-33%) red, pink granulation within the wound bed. There is a large (67-100%) amount of necrotic tissue within the wound bed including Adherent Slough. Wound #7 status is Open. Original cause of wound was Gradually Appeared. The wound is located on the Left,Lateral Metatarsal head fifth. The wound measures 1.7cm length x 2.3cm width x 0.9cm depth; 3.071cm^2 area and 2.764cm^3 volume. There is bone and Fat Layer (Subcutaneous Tissue) Exposed exposed. There is no tunneling noted, however, there is undermining starting at 6:00 and ending at 12:00 with a maximum distance of 1.3cm. There is a medium amount of purulent drainage noted. Foul odor after cleansing was noted. The wound margin is well defined and not attached to the wound base. There is small (1-33%) pink, pale granulation within the wound bed. There is a large (67-100%) amount of necrotic tissue within the wound bed including Adherent Slough. General Notes: redden, edema, and odor noted to periwound and wound. 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 There was a Excisional Skin/Subcutaneous Tissue Debridement with a total area of 3.91 sq cm performed by Worthy Keeler, PA. With the following instrument(s): Curette to remove Viable and Non-Viable tissue/material. Material removed includes Callus, Subcutaneous Tissue, and Slough. No specimens were taken. A time out was conducted at 11:44, prior to the start of the procedure. A Minimum amount of bleeding was controlled with Pressure. The procedure was tolerated well with a pain level of 0 throughout and a pain level of 0 following the procedure. Post Debridement Measurements: 1.7cm length x 2.3cm width x 0.9cm depth; 2.764cm^3 volume. Character of Wound/Ulcer Post Debridement requires further debridement. Severity of Tissue Post Debridement is: Fat layer exposed. Post procedure Diagnosis Wound #7: Same as Pre-Procedure Plan Follow-up Appointments: Return Appointment in: - Call to schedule appointment after discharge from hospital Dressing Change Frequency: Wound #2 Right,Plantar Foot: Do not change entire dressing for one week. Wound #4 Right,Medial Lower Leg: Do not change entire dressing for one week. Wound #5 Left,Medial Lower Leg: Do not change entire dressing for one week. Wound #6  Left,Proximal,Medial Lower Leg: Do not change entire dressing for one week. Skin Barriers/Peri-Wound Care: Moisturizing lotion - both legs Wound Cleansing: May shower with protection. Primary Wound Dressing: Wound #2 Right,Plantar Foot: Calcium Alginate with Silver -  pack lightly into wound Wound #7 Left,Lateral Metatarsal head fifth: Calcium Alginate with Silver - pack lightly into wound Wound #4 Right,Medial Lower Leg: Calcium Alginate with Silver Wound #5 Left,Medial Lower Leg: Calcium Alginate with Silver Wound #6 Left,Proximal,Medial Lower Leg: Calcium Alginate with Silver Secondary Dressing: Wound #2 Right,Plantar Foot: Foam - foam donut Kerlix/Rolled Gauze Dry Gauze Drawtex - cut to fit inside wound edges to hold collagen in place Wound #4 Right,Medial Lower Leg: Kerlix/Rolled Gauze Dry Gauze ABD pad Wound #5 Left,Medial Lower Leg: Kerlix/Rolled Gauze Dry Gauze ABD pad Wound #6 Left,Proximal,Medial Lower Leg: Kerlix/Rolled Gauze Dry Gauze ABD pad Edema Control: Avoid standing for long periods of time Elevate legs to the level of the heart or above for 30 minutes daily and/or when sitting, a frequency of: - whenever sitting Exercise regularly 1. My suggestion at this point based on what I am seeing is that we go ahead and have the patient go to the ER for further evaluation and treatment of her wounds. Again the right lower extremity ulcer is worse. Her left foot ulcer however is the worst location with cellulitis spreading from the foot now into the leg and last week we did not even notice anything in the leg region as far as cellulitis was concerned. This obviously indicates that this is a progressing situation which has me concerned. 2. We were given her IV vancomycin through dialysis which they graciously helped [name] with in the interim from last week to this week until the culture results return. Unfortunately it appears something such as cefepime would be  a better option based on the Serratia species identified. However at this point I really feel like she needs more immediate care through the emergency department to try to get things under better control and then subsequently we can work on progressing from there. 3. I am going to place dressings with silver alginate followed by ABD pads today to have something in place until she goes in for further evaluation at the ER. Obviously these are temporary dressings no wraps were applied today. 4. With regard to her MRI of the left foot that is still pending review at this point there was no result upon the time I was seeing the patient today we should have that within the day obviously that will be something that the hospital will be able to evaluate as well. We will see the patient back for follow-up visit after she is discharged from the [facility] Signature(s) Signed: 03/23/2019 11:59:40 AM By: Worthy Keeler PA-C Entered By: Worthy Keeler on 03/23/2019 11:59:40 -------------------------------------------------------------------------------- HxROS Details Patient Name: Date of Service: [name] 03/23/2019 10:45 AM Medical Record [id number] Patient Account Number: [id number] Date of Birth/Sex: Treating RN: 1946/01/25 (73 y.o. [name] Primary Care Provider: [name] Other Clinician: Referring Provider: Treating Provider/Extender:Stone III, Lemar Livings, Lendon Ka Weeks in Treatment: 4 Information Obtained From Patient Constitutional Symptoms (General Health) Complaints and Symptoms: Negative for: Fatigue; Fever; Chills; Marked Weight Change Respiratory Complaints and Symptoms: Negative for: Chronic or frequent coughs; Shortness of Breath Cardiovascular Complaints and Symptoms: Negative for: Chest pain Medical History: Positive for: Congestive Heart Failure; Coronary Artery Disease; Hypertension; Peripheral Arterial Disease; Peripheral  Venous Disease Past Medical History Notes: Hyperlipidemia, Cardiomyopathy Psychiatric Complaints and Symptoms: Negative for: Claustrophobia; Suicidal Eyes Medical History: Positive for: Cataracts - removed Endocrine Medical History: Positive for: Type II Diabetes Time with diabetes: since 2000 Treated with: Insulin Blood sugar tested every day: Yes Tested :  1-2x a day Genitourinary Medical History: Positive for: End Stage Renal Disease - Hemodialysis 3x/week Integumentary (Skin) Medical History: Negative for: History of Burn Musculoskeletal Medical History: Positive for: Osteoarthritis Neurologic Medical History: Positive for: Neuropathy Oncologic Medical History: Positive for: Received Radiation Past Medical History Notes: Breast cancer 2014 with lumpectomy HBO Extended History Items Eyes: Cataracts Immunizations Pneumococcal Vaccine: Received Pneumococcal Vaccination: Yes Tetanus Vaccine: Last tetanus shot: 01/13/2012 Implantable Devices None Family and Social History Cancer: Yes - Father,Siblings; Diabetes: Yes - Maternal Grandparents,Paternal Grandparents,Mother,Father,Siblings; Heart Disease: Yes - Maternal Grandparents,Paternal Grandparents,Mother,Father,Siblings; Hereditary Spherocytosis: No; Hypertension: Yes - Maternal Grandparents,Paternal Grandparents; Kidney Disease: Yes - Siblings; Lung Disease: No; Seizures: No; Stroke: No; Thyroid Problems: No; Tuberculosis: No; Never smoker; Marital Status - Married; Alcohol Use: Never; Drug Use: No History; Caffeine Use: Rarely; Financial Concerns: No; Food, Clothing or Shelter Needs: No; Support System Lacking: No; Transportation Concerns: No Physician Affirmation I have reviewed and agree with the above information. Electronic Signature(s) Signed: 03/23/2019 5:37:18 PM By: Worthy Keeler PA-C Signed: 03/24/2019 5:32:06 PM By: [name] RN, BSN Entered By: Worthy Keeler on 03/23/2019  11:57:04 -------------------------------------------------------------------------------- SuperBill Details Patient Name: Date of Service: [name] 03/23/2019 Medical Record [id number] Patient Account Number: [id number] Date of Birth/Sex: Treating RN: [birth year] (73 y.o. 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 of L97.526 necrosis I87.2 Venous insufficiency (chronic) (peripheral) I89.0 Lymphedema, not elsewhere classified L97.812 Non-pressure chronic ulcer of other part of right lower leg with fat layer exposed L97.822 Non-pressure chronic ulcer of other part of left lower leg with fat layer exposed I73.89 Other specified peripheral vascular diseases N18.6 End stage renal disease Z99.2 Dependence on renal dialysis I10 Essential (primary) hypertension Facility Procedures CPT4: Description Modifier Quantity Code [id number] 11042 - DEB SUBQ TISSUE 20 SQ CM/< 1 ICD-10 Diagnosis Description L97.526 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 of necrosis Physician Procedures CPT4: Code [id number] 16 Description: 214 - WC PHYS LEVEL 4 - EST PT ICD-10 Diagnosis Description E11.621 Type 2 diabetes mellitus with foot ulcer L97.512 Non-pressure chronic ulcer of other part of right foot wi L97.526 Non-pressure chronic ulcer of other part of left foot wit  evidence of necrosis I87.2 Venous insufficiency (chronic) (peripheral) Modifier: 25 th fat layer ex h bone involvem Quantity: 1 posed ent without CPT4: 9678938 Description:  11042 - WC PHYS SUBQ TISS 20 SQ CM ICD-10 Diagnosis Description L97.526 Non-pressure chronic ulcer of other part of left foot with b evidence of necrosis Modifier: one involvement Quantity: 1 without Electronic Signature(s) Signed: 03/23/2019 12:00:12 PM By: Worthy Keeler PA-C Entered By: Worthy Keeler on 03/23/2019 12:00:11

## 2019-03-23 NOTE — ED Provider Notes (Addendum)
[facility] EMERGENCY DEPARTMENT Provider Note   CSN: [id number] Arrival date & time: 03/23/19  1234     History No chief complaint on file.   [name] is a 73 y.o. female.  Pt presents to the ED today with an infection in her left foot.  The pt has had a wound to her left foot for which she's been going to the wound care clinic.  Yesterday, they did a MRI which showed septic arthritis at the 5th MTP with osteomyelitis, surrounding inflammatory phlegmon and abscess, diffuse cellulitis and myofasciitis.  The pt denies any pain to her foot.  The pt denies any fevers.  No known covid exposures.  Pt is a dialysis pt and gets dialysis Tues, Thurs, and Sat.  She did go to dialysis yesterday and has not missed any doses.        Past Medical History:  Diagnosis Date  . Allergy   . Arthritis    "maybe in my lower back" (08/27/2012)  . 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 Chronic combined systolic and diastolic CHF (congestive heart failure) ([place])   . CKD (chronic kidney disease), stage IV ([place])   . Coronary artery disease    a. NSTEMI in 05/2009 s/p CABG (LIMA-LAD, SVG-diagonal, SVG-OM1/OM 2).   Marland Kitchen Dyspnea    with exertion  . Family history of anesthesia complication    Mom has a hard time to wake up  . Foot ulcer ([place])    "I've had them on both feet" (08/27/2012)  . Gouty arthritis    "right index finger" (08/27/2012)  . History of radiation therapy 06/09/13-07/06/13   left breast 50Gy  . Hyperlipidemia   . Hypertension   . Infection    right second toe  . Ischemic cardiomyopathy    a. 2011: EF 40-45%. b. EF 55-60% in 04/2017 but shortly after as inpatient was 45-50%.  Marland Kitchen LBBB (left bundle branch block)   . Mitral regurgitation   . Neuropathy    Hx; of B/L feet  . NSTEMI (non-ST elevated myocardial infarction) ([place]) 05/23/2009  . Osteomyelitis of foot ([place])   . PAD (peripheral artery disease) (H[facility])    a.  LE PAD (patient previously elected hold off L fem-pop), prev followed by Dr. Bridgett Larsson  . PONV (postoperative nausea and vomiting)   . Sinus headache    "occasionally" (08/27/2012)  . Type II diabetes mellitus ([place])   . Vitamin D deficiency     Patient Active Problem List   Diagnosis Date Noted  . Hyperlipidemia associated with type 2 diabetes mellitus ([place]) 01/09/2019  . Diabetes mellitus due to underlying condition with chronic kidney disease on chronic dialysis, with long-term current use of insulin ([place]) 01/09/2019  . ESRD (end stage renal disease) ([place])   . Generalized weakness   . Mitral valve mass   . Coronary artery disease involving coronary bypass graft of native heart without angina pectoris   . Bradycardia   . Anemia 05/03/2017  . Chronic kidney disease (CKD), active medical management without dialysis, stage 5 ([place]) 05/08/2015  . Diabetes mellitus type 2, insulin dependent ([place]) 05/08/2015  . BMI 29.0-29.9,adult 02/14/2015  . Medication management 08/04/2014  . Type II diabetes mellitus with neurological manifestations, uncontrolled ([place]) 02/17/2014  . Gouty arthritis   . Vitamin D deficiency   . Malignant neoplasm of upper-inner quadrant of left breast in female, estrogen receptor positive ([place]) 02/14/2013  . Peripheral vascular disease (  [place]) 09/05/2009  . Hyperlipidemia 06/20/2009  . Essential hypertension 06/20/2009  . Congestive heart failure ([place]) 06/20/2009  . EMPHYSEMA 06/20/2009  . Asthma 06/20/2009    Past Surgical History:  Procedure Laterality Date  . AMPUTATION Right 08/27/2012   Procedure: AMPUTATION RAY;  Surgeon: Newt Minion, MD;  Location: [facility];  Service: Orthopedics;  Laterality: Right;  Right Foot 5th Ray Amputation  . AMPUTATION Left 07/08/2013   Procedure: AMPUTATION RAY;  Surgeon: Newt Minion, MD;  Location: [facility];  Service: Orthopedics;  Laterality: Left;  Left Foot 2nd Ray Amputation  . AMPUTATION RAY Right 08/27/2012   5th ray/notes 08/27/2012    . AMPUTATION TOE Right 03/06/2017   Procedure: AMPUTATION TOE, INTERPHANGEAL 2ND RIGHT;  Surgeon: [name], DPM;  Location: [facility];  Service: Podiatry;  Laterality: Right;  . AV FISTULA PLACEMENT Right 11/11/2017   Procedure: RIGHT RADIOCEPHALIC  ARTERIOVENOUS FISTULA;  Surgeon: [name], MD;  Location: [facility];  Service: Vascular;  Laterality: Right;  . BREAST LUMPECTOMY  04/20/2013   with biopsy      DR WAKEFIELD  . BREAST LUMPECTOMY WITH NEEDLE LOCALIZATION AND AXILLARY SENTINEL LYMPH NODE BX Left 04/20/2013   Procedure: LEFT BREAST WIRE GUIDED LUMPECTOMY AND AXILLARY SENTINEL LYMPH NODE BX;  Surgeon: Rolm Bookbinder, MD;  Location: [facility];  Service: General;  Laterality: Left;  . CARDIAC CATHETERIZATION  05/24/2009   Archie Endo 05/24/2009 (08/27/2012)  . CATARACT EXTRACTION W/ INTRAOCULAR LENS  IMPLANT, BILATERAL  2000's  . COLONOSCOPY W/ BIOPSIES AND POLYPECTOMY  2010  . CORONARY ARTERY BYPASS GRAFT  2011   "CABG X4" (08/27/2012)  . DENTAL SURGERY  04/30/11   "1 implant" (08/27/2012)  . DILATION AND CURETTAGE OF UTERUS    . EYE SURGERY    . FINGER SURGERY Right    "index finger; turned out to be gout" (08/27/2012)  . IR FLUORO GUIDE CV LINE RIGHT  11/30/2017  . TEE WITHOUT CARDIOVERSION N/A 12/01/2017   Procedure: TRANSESOPHAGEAL ECHOCARDIOGRAM (TEE);  Surgeon: Lelon Perla, MD;  Location: [facility] ENDOSCOPY;  Service: Cardiovascular;  Laterality: N/A;     OB History   No obstetric history on file.     Family History  Problem Relation Age of Onset  . Kidney cancer Brother 58  . Hypertension Brother   . Breast cancer Sister 89       LCIS; BRCA negative  . Throat cancer Father 61       smoker  . Breast cancer Sister 39       Lobular breast cancer  . Breast cancer Maternal Aunt        dx in her 80s    Social History   Tobacco Use  . Smoking status: Former Research scientist (life sciences)  . Smokeless tobacco: Never Used  Substance Use Topics  . Alcohol use: Yes    Comment: rarely drinks wine   . Drug use: No    Home Medications Prior to Admission medications   Medication Sig Start Date End Date Taking? Authorizing Provider  allopurinol (ZYLOPRIM) 100 MG tablet Take 1 tablet 2 x /day to Prevent Gout 10/27/18   Unk Pinto, MD  aspirin EC 81 MG tablet Take 1 tablet (81 mg total) by mouth daily. 05/18/17   Burtis Junes, NP  Aspirin-Acetaminophen-Caffeine (EXCEDRIN EXTRA STRENGTH PO) Take by mouth as needed.    [provider]  b complex-vitamin c-folic acid (NEPHRO-VITE) 0.8 MG TABS tablet TAKE 1 TABLET BY MOUTH EVERY DAY (ON DIALYSIS DAYS,  TAKE AFTER DIALYSIS TREATMENT) 03/19/18   [provider]  BD VEO INSULIN SYRINGE U/F 31G X 15/64" 1 ML MISC USE AS DIRECTED WITH INSULIN 02/06/18   Unk Pinto, MD  Cholecalciferol (VITAMIN D3) 2000 units TABS Take 4,000 Units by mouth daily.     [provider]  cinacalcet (SENSIPAR) 30 MG tablet Take 30 mg by mouth daily with breakfast. Takes 1 tablet 3 days a week.     [provider]  docusate sodium (COLACE) 100 MG capsule Take 100 mg by mouth at bedtime.    [provider]  fluticasone (FLONASE) 50 MCG/ACT nasal spray Place 2 sprays into both nostrils daily as needed for allergies or rhinitis.    [provider]  glucose blood (PRODIGY NO CODING BLOOD GLUC) test strip CHECK SUGARS 3 TIMES A DAY.[id number] 02/08/18   Unk Pinto, MD  hydrALAZINE (APRESOLINE) 25 MG tablet Take 3 tablets (75 mg) 3 x /day for BP & Heart 10/27/18   Unk Pinto, MD  HYDROcodone-acetaminophen (NORCO/VICODIN) 5-325 MG tablet Take 0.5-1 tablets by mouth every 6 (six) hours as needed for severe pain. 03/01/19   Liane Comber, NP  insulin NPH-regular Human (70-30) 100 UNIT/ML injection Inject 10-30 Units into the skin 2 (two) times daily with a meal.    [provider]  isosorbide dinitrate (ISORDIL) 10 MG tablet Take 1 tablet 3 x /day for BP & Heart 10/27/18   Unk Pinto, MD   loratadine (CLARITIN) 10 MG tablet Take 10 mg by mouth daily.    [provider]  Multiple Vitamins-Minerals (PRESERVISION AREDS 2 PO) Take 2 capsules by mouth daily.    [provider]  Probiotic Product (PROBIOTIC DAILY PO) Take 1 tablet by mouth daily.    [provider]  psyllium (METAMUCIL) 58.6 % powder Take 1 packet by mouth 3 (three) times daily.    [provider]  sevelamer carbonate (RENVELA) 800 MG tablet TAKE 2 TABLETS BY MOUTH THREE TIMES DAILY WITH MEALS 04/20/18   [provider]  traZODone (DESYREL) 50 MG tablet 1/2-2 tablets 1 hour prior to bedtime for sleep. 03/01/19   Liane Comber, NP    Allergies    Atenolol, Adhesive [tape], Contrast media [iodinated diagnostic agents], Iohexol, and Latex  Review of Systems   Review of Systems  Musculoskeletal:       Left foot redness and swelling.  All other systems reviewed and are negative.   Physical Exam Updated Vital Signs BP (!) 134/56   Pulse 88   Temp 99 F (37.2 C) (Oral)   Resp 18   SpO2 96%   Physical Exam Vitals and nursing note reviewed.  Constitutional:      Appearance: Normal appearance.  HENT:     Head: Normocephalic and atraumatic.     Right Ear: External ear normal.     Left Ear: External ear normal.     Mouth/Throat:     Mouth: Mucous membranes are moist.     Pharynx: Oropharynx is clear.  Eyes:     Extraocular Movements: Extraocular movements intact.     Conjunctiva/sclera: Conjunctivae normal.     Pupils: Pupils are equal, round, and reactive to light.  Cardiovascular:     Rate and Rhythm: Normal rate and regular rhythm.     Pulses: Normal pulses.     Heart sounds: Normal heart sounds.  Pulmonary:     Effort: Pulmonary effort is normal.     Breath sounds: Normal breath sounds.  Abdominal:  General: Abdomen is flat. Bowel sounds are normal.     Palpations: Abdomen is soft.  Musculoskeletal:     Cervical back: Normal range of motion and  neck supple.       Legs:     Comments: See pictures  Skin:    Capillary Refill: Capillary refill takes less than 2 seconds.  Neurological:     General: No focal deficit present.     Mental Status: She is alert and oriented to person, place, and time.  Psychiatric:        Mood and Affect: Mood normal.        Behavior: Behavior normal.        Thought Content: Thought content normal.        Judgment: Judgment normal.          ED Results / Procedures / Treatments   Labs (all labs ordered are listed, but only abnormal results are displayed) Labs Reviewed  COMPREHENSIVE METABOLIC PANEL - Abnormal; Notable for the following components:      Result Value   BUN 40 (*)    Creatinine, Ser 3.40 (*)    Calcium 8.4 (*)    Total Protein 5.9 (*)    Albumin 2.8 (*)    Alkaline Phosphatase 192 (*)    GFR calc non Af Amer 13 (*)    GFR calc Af Amer 15 (*)    All other components within normal limits  CBC WITH DIFFERENTIAL/PLATELET - Abnormal; Notable for the following components:   WBC 10.7 (*)    RBC 3.50 (*)    Hemoglobin 10.8 (*)    HCT 33.1 (*)    RDW 15.6 (*)    Neutro Abs 8.0 (*)    Monocytes Absolute 1.1 (*)    All other components within normal limits  CULTURE, BLOOD (ROUTINE X 2)  CULTURE, BLOOD (ROUTINE X 2)  LACTIC ACID, PLASMA  PROTIME-INR  LACTIC ACID, PLASMA  POC SARS CORONAVIRUS 2 AG -  ED    EKG None  HR 86 NO ST or T wave changes Sinus arrhythmia  Radiology DG Chest 2 View  Result Date: 03/23/2019 CLINICAL DATA:  Left foot ulcer. Sepsis. EXAM: CHEST - 2 VIEW COMPARISON:  Aug 25, 2018. FINDINGS: The heart size and mediastinal contours are within normal limits. No pneumothorax or significant pleural effusion is noted. Status post coronary bypass graft. No acute pulmonary disease is noted. Atherosclerosis of thoracic aorta is noted. The visualized skeletal structures are unremarkable. IMPRESSION: No active cardiopulmonary disease. Aortic atherosclerosis.  Electronically Signed   By: Marijo Conception M.D.   On: 03/23/2019 14:07   MR FOOT LEFT WO CONTRAST  Result Date: 03/23/2019 CLINICAL DATA:  Nonhealing ulcer along the lateral aspect of the foot. EXAM: MRI OF THE LEFT FOOT WITHOUT CONTRAST TECHNIQUE: Multiplanar, multisequence MR imaging of the 03/10/2019 was performed. No intravenous contrast was administered. COMPARISON:  Radiographs 03/10/2019 FINDINGS: Diffuse abnormal T1 and T2 signal intensity in the mid distal aspect of the fifth metatarsal and also in the proximal phalanx. There is joint space narrowing at the fifth MTP joint, small joint effusion and surrounding inflammatory phlegmon and abscess. Findings consistent with septic arthritis at the fifth MTP joint and osteomyelitis involving the fifth metatarsal and fifth proximal phalanx. Remote partial second ray amputation. No other areas of osteomyelitis are identified. Moderate to advanced midfoot degenerative changes but no erosive findings or destructive bony changes. Diffuse subcutaneous soft tissue swelling/edema/fluid suggesting cellulitis. There is also mild diffuse  myositis. IMPRESSION: 1. Septic arthritis at the fifth MTP joint with osteomyelitis involving the distal half of the fifth metatarsal and also the proximal phalanx. 2. Surrounding inflammatory phlegmon and abscess. 3. Diffuse cellulitis and myofasciitis. Electronically Signed   By: Marijo Sanes M.D.   On: 03/23/2019 12:07    Procedures Procedures (including critical care time)  Medications Ordered in ED Medications  piperacillin-tazobactam (ZOSYN) IVPB 3.375 g (has no administration in time range)  vancomycin (VANCOREADY) IVPB 1500 mg/300 mL (has no administration in time range)  vancomycin variable dose per unstable renal function (pharmacist dosing) (has no administration in time range)    ED Course  I have reviewed the triage vital signs and the nursing notes.  Pertinent labs & imaging results that were available  during my care of the patient were reviewed by me and considered in my medical decision making (see chart for details).    MDM Rules/Calculators/A&P                      Pt started on zosyn and vancomycin for the osteomyelitis and cellulitis.  She was d/w Silvestre Gunner PA for ortho.  Pt d/w [facility]ad for admission.   Final Clinical Impression(s) / ED Diagnoses Final diagnoses:  Septic arthritis of left foot, due to unspecified organism [facility])  Other acute osteomyelitis of left foot ([place])  ESRD on hemodialysis ([place])  Cellulitis of left foot    Rx / DC Orders ED Discharge Orders    None       Isla Pence, MD 03/23/19 1533    Isla Pence, MD 04/11/19 1034

## 2019-03-23 NOTE — Consult Note (Signed)
Reason for Consult: Continuity of ESRD care Referring Physician: Wynetta Fines MD [facility])  HPI:  73 year old Caucasian woman with past medical history significant for coronary artery disease status post CABG with ischemic cardiomyopathy and combined systolic/diastolic congestive heart failure, hypertension, poorly controlled type 2 diabetes mellitus, peripheral vascular disease status post revascularization procedures in the past with toe amputations, breast cancer status post lumpectomy/radiation and end-stage renal disease on hemodialysis on a Tuesday/Thursday/Saturday schedule.  She has been going to the wound care center for the last 3 weeks for management of bilateral lower extremity ulcers and on her visit today was directed to the emergency room after MRI of the left foot yesterday showed septic arthritis of the fifth MTP joint with osteomyelitis of the fifth metatarsal/proximal phalanx as well as surrounding inflammatory phlegmon and abscess with diffuse cellulitis and myofasciitis.  She has been on intravenous vancomycin for the last week through dialysis.  She reports some low-grade fevers on measurement at home but this is not corroborated with measurement at dialysis.  She denies any chest pain or shortness of breath.  She denies any nausea, vomiting or diarrhea and continues to have good appetite.  She denies any dysuria (makes about 300 cc yellow urine a day).  She has been evaluated by Dr. Carlis Abbott of vascular surgery who suspects that she will need to undergo left leg revascularization based on Dr. Jess Barters assessment that is pending regarding surgical intervention with amputation.  Dialysis prescription: [facility], TTS, EDW 72 kg, BFR 350, DFR 800, Optiflux 180, 3K/2.25 calcium, vancomycin 750 mg q. HD, Mircera 50 mcg every 2 weeks, Hectorol 4 mcg IV 3 times weekly, heparin 5000 unit bolus.  Right radiocephalic fistula.  Past Medical History:  Diagnosis Date  . Allergy    . Arthritis    "maybe in my lower back" (08/27/2012)  . 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 Chronic combined systolic and diastolic CHF (congestive heart failure) ([place])   . CKD (chronic kidney disease), stage IV ([place])   . Coronary artery disease    a. NSTEMI in 05/2009 s/p CABG (LIMA-LAD, SVG-diagonal, SVG-OM1/OM 2).   Marland Kitchen Dyspnea    with exertion  . Family history of anesthesia complication    Mom has a hard time to wake up  . Foot ulcer ([place])    "I've had them on both feet" (08/27/2012)  . Gouty arthritis    "right index finger" (08/27/2012)  . History of radiation therapy 06/09/13-07/06/13   left breast 50Gy  . Hyperlipidemia   . Hypertension   . Infection    right second toe  . Ischemic cardiomyopathy    a. 2011: EF 40-45%. b. EF 55-60% in 04/2017 but shortly after as inpatient was 45-50%.  Marland Kitchen LBBB (left bundle branch block)   . Mitral regurgitation   . Neuropathy    Hx; of B/L feet  . NSTEMI (non-ST elevated myocardial infarction) ([place]) 05/23/2009  . Osteomyelitis of foot ([place])   . PAD (peripheral artery disease) ([facility])    a. LE PAD (patient previously elected hold off L fem-pop), prev followed by Dr. Bridgett Larsson  . PONV (postoperative nausea and vomiting)   . Sinus headache    "occasionally" (08/27/2012)  . Type II diabetes mellitus ([place])   . Vitamin D deficiency     Past Surgical History:  Procedure Laterality Date  . AMPUTATION Right 08/27/2012   Procedure: AMPUTATION RAY;  Surgeon: Newt Minion, MD;  Location: [facility]  OR;  Service: Orthopedics;  Laterality: Right;  Right Foot 5th Ray Amputation  . AMPUTATION Left 07/08/2013   Procedure: AMPUTATION RAY;  Surgeon: Newt Minion, MD;  Location: [place];  Service: Orthopedics;  Laterality: Left;  Left Foot 2nd Ray Amputation  . AMPUTATION RAY Right 08/27/2012   5th ray/notes 08/27/2012  . AMPUTATION TOE Right 03/06/2017   Procedure: AMPUTATION TOE, INTERPHANGEAL 2ND RIGHT;  Surgeon: [name], DPM;  Location: [place];  Service: Podiatry;  Laterality: Right;  . AV FISTULA PLACEMENT Right 11/11/2017   Procedure: RIGHT RADIOCEPHALIC  ARTERIOVENOUS FISTULA;  Surgeon: [name], MD;  Location: [facility];  Service: Vascular;  Laterality: Right;  . BREAST LUMPECTOMY  04/20/2013   with biopsy      DR WAKEFIELD  . BREAST LUMPECTOMY WITH NEEDLE LOCALIZATION AND AXILLARY SENTINEL LYMPH NODE BX Left 04/20/2013   Procedure: LEFT BREAST WIRE GUIDED LUMPECTOMY AND AXILLARY SENTINEL LYMPH NODE BX;  Surgeon: Rolm Bookbinder, MD;  Location: [place];  Service: General;  Laterality: Left;  . CARDIAC CATHETERIZATION  05/24/2009   Archie Endo 05/24/2009 (08/27/2012)  . CATARACT EXTRACTION W/ INTRAOCULAR LENS  IMPLANT, BILATERAL  2000's  . COLONOSCOPY W/ BIOPSIES AND POLYPECTOMY  2010  . CORONARY ARTERY BYPASS GRAFT  2011   "CABG X4" (08/27/2012)  . DENTAL SURGERY  04/30/11   "1 implant" (08/27/2012)  . DILATION AND CURETTAGE OF UTERUS    . EYE SURGERY    . FINGER SURGERY Right    "index finger; turned out to be gout" (08/27/2012)  . IR FLUORO GUIDE CV LINE RIGHT  11/30/2017  . TEE WITHOUT CARDIOVERSION N/A 12/01/2017   Procedure: TRANSESOPHAGEAL ECHOCARDIOGRAM (TEE);  Surgeon: Lelon Perla, MD;  Location: [facility] ENDOSCOPY;  Service: Cardiovascular;  Laterality: N/A;    Family History  Problem Relation Age of Onset  . Kidney cancer Brother 32  . Hypertension Brother   . Breast cancer Sister 64       LCIS; BRCA negative  . Throat cancer Father 93       smoker  . Breast cancer Sister 69       Lobular breast cancer  . Breast cancer Maternal Aunt        dx in her 46s    Social History:  reports that she has quit smoking. She has never used smokeless tobacco. She reports current alcohol use. She reports that she does not use drugs.  Allergies:  Allergies  Allergen Reactions  . Atenolol Other (See Comments) and Rash    Exacerbates gout Exacerbates gout  . Adhesive [Tape] Other (See Comments)     SKIN IS VERY SENSITIVE AND BRUISES AND TEARS EASILY; PLEASE USE AN ALTERNATIVE TO TAPE!!  . Contrast Media [Iodinated Diagnostic Agents] Rash  . Iohexol Rash and Other (See Comments)  . Latex Rash    Medications:  Scheduled: . allopurinol  100 mg Oral BID  . aspirin EC  81 mg Oral Daily  . cinacalcet  30 mg Oral Once per day on Mon Wed Fri  . [START ON 03/24/2019] heparin  5,000 Units Subcutaneous Q12H  . hydrALAZINE  25 mg Oral Q8H  . [START ON 03/24/2019] insulin aspart  0-6 Units Subcutaneous TID WC  . isosorbide dinitrate  10 mg Oral TID  . traZODone  25-100 mg Oral QHS  . [START ON 03/24/2019] vancomycin variable dose per unstable renal function (pharmacist dosing)   Does not apply See admin instructions   Continuous: . vancomycin 1,500 mg (  03/23/19 1736)    BMP Latest Ref Rng & Units 03/23/2019 01/10/2019 08/25/2018  Glucose 70 - 99 mg/dL 70 156(H) 106(H)  BUN 8 - 23 mg/dL 40(H) 35(H) 64(H)  Creatinine 0.44 - 1.00 mg/dL 3.40(H) 3.17(H) 4.13(H)  BUN/Creat Ratio 6 - 22 (calc) - 11 -  Sodium 135 - 145 mmol/L 138 137 134(L)  Potassium 3.5 - 5.1 mmol/L 4.1 4.0 3.1(L)  Chloride 98 - 111 mmol/L 98 99 99  CO2 22 - 32 mmol/L 27 27 20(L)  Calcium 8.9 - 10.3 mg/dL 8.4(L) 8.9 9.4   CBC Latest Ref Rng & Units 03/23/2019 01/10/2019 08/25/2018  WBC 4.0 - 10.5 K/uL 10.7(H) 6.8 11.6(H)  Hemoglobin 12.0 - 15.0 g/dL 10.8(L) 11.8 11.6(L)  Hematocrit 36.0 - 46.0 % 33.1(L) 36.3 35.1(L)  Platelets 150 - 400 K/uL 274 281 278     DG Chest 2 View  Result Date: 03/23/2019 CLINICAL DATA:  Left foot ulcer. Sepsis. EXAM: CHEST - 2 VIEW COMPARISON:  Aug 25, 2018. FINDINGS: The heart size and mediastinal contours are within normal limits. No pneumothorax or significant pleural effusion is noted. Status post coronary bypass graft. No acute pulmonary disease is noted. Atherosclerosis of thoracic aorta is noted. The visualized skeletal structures are unremarkable. IMPRESSION: No active cardiopulmonary  disease. Aortic atherosclerosis. Electronically Signed   By: Marijo Conception M.D.   On: 03/23/2019 14:07   MR FOOT LEFT WO CONTRAST  Result Date: 03/23/2019 CLINICAL DATA:  Nonhealing ulcer along the lateral aspect of the foot. EXAM: MRI OF THE LEFT FOOT WITHOUT CONTRAST TECHNIQUE: Multiplanar, multisequence MR imaging of the 03/10/2019 was performed. No intravenous contrast was administered. COMPARISON:  Radiographs 03/10/2019 FINDINGS: Diffuse abnormal T1 and T2 signal intensity in the mid distal aspect of the fifth metatarsal and also in the proximal phalanx. There is joint space narrowing at the fifth MTP joint, small joint effusion and surrounding inflammatory phlegmon and abscess. Findings consistent with septic arthritis at the fifth MTP joint and osteomyelitis involving the fifth metatarsal and fifth proximal phalanx. Remote partial second ray amputation. No other areas of osteomyelitis are identified. Moderate to advanced midfoot degenerative changes but no erosive findings or destructive bony changes. Diffuse subcutaneous soft tissue swelling/edema/fluid suggesting cellulitis. There is also mild diffuse myositis. IMPRESSION: 1. Septic arthritis at the fifth MTP joint with osteomyelitis involving the distal half of the fifth metatarsal and also the proximal phalanx. 2. Surrounding inflammatory phlegmon and abscess. 3. Diffuse cellulitis and myofasciitis. Electronically Signed   By: Marijo Sanes M.D.   On: 03/23/2019 12:07    Review of Systems  Constitutional: Positive for fatigue and fever. Negative for chills.  HENT: Negative for congestion, hearing loss, nosebleeds and sore throat.   Eyes: Negative.   Respiratory: Negative for cough, chest tightness and shortness of breath.   Cardiovascular: Positive for leg swelling. Negative for chest pain and palpitations.  Gastrointestinal: Positive for constipation. Negative for abdominal pain, blood in stool, diarrhea and nausea.  Genitourinary:  Negative for dysuria and flank pain.  Musculoskeletal: Positive for gait problem. Negative for back pain and myalgias.  Skin: Positive for wound.  Neurological: Negative for dizziness, light-headedness and headaches.   Blood pressure (!) 147/66, pulse 87, temperature 99 F (37.2 C), temperature source Oral, resp. rate 20, SpO2 97 %. Physical Exam  Nursing note and vitals reviewed. Constitutional: She is oriented to person, place, and time. She appears well-developed and well-nourished. No distress.  HENT:  Head: Normocephalic and atraumatic.  Mouth/Throat: Oropharynx is clear  and moist.  Eyes: Pupils are equal, round, and reactive to light. Conjunctivae and EOM are normal. No scleral icterus.  Neck: No JVD present.  Cardiovascular: Normal rate, regular rhythm and normal heart sounds.  No murmur heard. Respiratory: Effort normal and breath sounds normal. She has no wheezes. She has no rales.  GI: Soft. Bowel sounds are normal. There is no rebound and no guarding.  Musculoskeletal:        General: Edema present.     Cervical back: Normal range of motion and neck supple.     Comments: Right radiocephalic fistula with palpable thrill.  Bilateral trace lower extremity edema with superficial ulcers over medial aspect of both legs.  Ulceration noted over lateral aspect of left foot at fifth MTP joint with some drainage.  Neurological: She is alert and oriented to person, place, and time.  Skin: Skin is warm.    Assessment/Plan: 1.  Left foot fifth metatarsal osteomyelitis/septic arthritis: On intravenous antibiotic therapy initiated as an outpatient and continued here.  Ongoing evaluation by vascular surgery for possible revascularization procedure and awaiting additional evaluation by Dr. Sharol Given regarding additional surgical intervention. 2.  End-stage renal disease: Usually on a Tuesday/Thursday/Saturday dialysis schedule as an outpatient.  No acute dialysis needs noted at this time and will  go ahead and order for dialysis tomorrow preferably later in the day so as not to interfere with surgical scheduling. 3.  Anemia of chronic kidney disease: Likely to have significant inflammatory complex with ongoing labile source/osteomyelitis and will redose ESA with hemodialysis tomorrow expecting drop particularly if she has surgical intervention. 4.  Secondary hyperparathyroidism: We will reconcile medications to resume vitamin D receptor analog, Sensipar and phosphorus binder. 5.  Hypertension: Resume antihypertensive therapy and monitor with hemodialysis/ultrafiltration. 6.  Nutrition: With low albumin likely arising from infection/wound, continue renal diet with protein supplements.  Johnatan Baskette K. 03/23/2019, 5:52 PM

## 2019-03-24 ENCOUNTER — Ambulatory Visit: Payer: Self-pay | Admitting: Adult Health

## 2019-03-24 DIAGNOSIS — I739 Peripheral vascular disease, unspecified: Secondary | ICD-10-CM

## 2019-03-24 DIAGNOSIS — M009 Pyogenic arthritis, unspecified: Secondary | ICD-10-CM

## 2019-03-24 DIAGNOSIS — L03116 Cellulitis of left lower limb: Secondary | ICD-10-CM

## 2019-03-24 DIAGNOSIS — L02611 Cutaneous abscess of right foot: Secondary | ICD-10-CM

## 2019-03-24 DIAGNOSIS — Z992 Dependence on renal dialysis: Secondary | ICD-10-CM

## 2019-03-24 DIAGNOSIS — N186 End stage renal disease: Secondary | ICD-10-CM

## 2019-03-24 DIAGNOSIS — M86172 Other acute osteomyelitis, left ankle and foot: Secondary | ICD-10-CM

## 2019-03-24 LAB — RENAL FUNCTION PANEL
Albumin: 2.7 g/dL — ABNORMAL LOW (ref 3.5–5.0)
Anion gap: 14 (ref 5–15)
BUN: 63 mg/dL — ABNORMAL HIGH (ref 8–23)
CO2: 25 mmol/L (ref 22–32)
Calcium: 8.1 mg/dL — ABNORMAL LOW (ref 8.9–10.3)
Chloride: 96 mmol/L — ABNORMAL LOW (ref 98–111)
Creatinine, Ser: 4.29 mg/dL — ABNORMAL HIGH (ref 0.44–1.00)
GFR calc Af Amer: 11 mL/min — ABNORMAL LOW (ref >60)
GFR calc non Af Amer: 10 mL/min — ABNORMAL LOW (ref >60)
Glucose, Bld: 230 mg/dL — ABNORMAL HIGH (ref 70–99)
Phosphorus: 4.5 mg/dL (ref 2.5–4.6)
Potassium: 4.5 mmol/L (ref 3.5–5.1)
Sodium: 135 mmol/L (ref 135–145)

## 2019-03-24 LAB — VANCOMYCIN, RANDOM: Vancomycin Rm: 33

## 2019-03-24 LAB — GLUCOSE, CAPILLARY
Glucose-Capillary: 83 mg/dL (ref 70–99)
Glucose-Capillary: 346 mg/dL — ABNORMAL HIGH (ref 70–99)
Glucose-Capillary: 229 mg/dL — ABNORMAL HIGH (ref 70–99)
Glucose-Capillary: 175 mg/dL — ABNORMAL HIGH (ref 70–99)

## 2019-03-24 LAB — MRSA PCR SCREENING: MRSA by PCR: NEGATIVE

## 2019-03-24 LAB — HEMOGLOBIN A1C
Hgb A1c MFr Bld: 6.8 % — ABNORMAL HIGH (ref 4.8–5.6)
Mean Plasma Glucose: 148.46 mg/dL

## 2019-03-24 MED ORDER — PIPERACILLIN-TAZOBACTAM IN DEX 2-0.25 GM/50ML IV SOLN
2.2500 g | Freq: Three times a day (TID) | INTRAVENOUS | Status: AC
  Administered 2019-03-24 – 2019-04-01 (×21): 2.25 g via INTRAVENOUS
  Filled 2019-03-24 (×27): qty 50

## 2019-03-24 MED ORDER — INSULIN ASPART 100 UNIT/ML ~~LOC~~ SOLN
0.0000 [IU] | Freq: Three times a day (TID) | SUBCUTANEOUS | Status: DC
  Administered 2019-03-24 – 2019-03-25 (×2): 3 [IU] via SUBCUTANEOUS
  Administered 2019-03-25: 5 [IU] via SUBCUTANEOUS

## 2019-03-24 MED ORDER — CINACALCET HCL 30 MG PO TABS: 30.0000 mg | ORAL_TABLET | Freq: Every day | ORAL | Status: DC

## 2019-03-24 MED ORDER — SEVELAMER CARBONATE 800 MG PO TABS
1600.0000 mg | ORAL_TABLET | Freq: Three times a day (TID) | ORAL | Status: DC
  Administered 2019-03-24 – 2019-04-07 (×26): 1600 mg via ORAL
  Filled 2019-03-24 (×30): qty 2

## 2019-03-24 MED ORDER — CINACALCET HCL 30 MG PO TABS
30.0000 mg | ORAL_TABLET | Freq: Every day | ORAL | Status: DC
  Administered 2019-03-24 – 2019-04-06 (×14): 30 mg via ORAL
  Filled 2019-03-24 (×15): qty 1

## 2019-03-24 MED ORDER — SEVELAMER CARBONATE 800 MG PO TABS
800.0000 mg | ORAL_TABLET | Freq: Two times a day (BID) | ORAL | Status: DC | PRN
  Administered 2019-03-24: 800 mg via ORAL
  Filled 2019-03-24: qty 1

## 2019-03-24 NOTE — Consult Note (Addendum)
ORTHOPAEDIC CONSULTATION  REQUESTING PHYSICIAN: Modena Jansky, MD  Chief Complaint: Osteomyelitis and ischemic ulcer left foot fifth toe as well as ischemic ulcers to both legs and an abscess on the plantar aspect of the right foot.  HPI: [name] is a 73 y.o. female who presents with ischemic ulceration and osteomyelitis bilateral feet.  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  Patient has been seen by podiatry as well as the wound center and presents at this time with gangrene and ulceration of the left little toe.  Patient has been seen by vascular vein surgery in the past and does have occlusions of bilateral SFAs.  Patient has been seen by Dr. Carlis Abbott of vascular vein surgery during this admission.  Past Medical History:  Diagnosis Date  . Allergy   . Arthritis    "maybe in my lower back" (08/27/2012)  . 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 Chronic combined systolic and diastolic CHF (congestive heart failure) ([place])   . CKD (chronic kidney disease), stage IV ([place])   . Coronary artery disease    a. NSTEMI in 05/2009 s/p CABG (LIMA-LAD, SVG-diagonal, SVG-OM1/OM 2).   Marland Kitchen Dyspnea    with exertion  . Family history of anesthesia complication    Mom has a hard time to wake up  . Foot ulcer ([facility])    "I've had them on both feet" (08/27/2012)  . Gouty arthritis    "right index finger" (08/27/2012)  . History of radiation therapy 06/09/13-07/06/13   left breast 50Gy  . Hyperlipidemia   . Hypertension   . Infection    right second toe  . Ischemic cardiomyopathy    a. 2011: EF 40-45%. b. EF 55-60% in 04/2017 but shortly after as inpatient was 45-50%.  Marland Kitchen LBBB (left bundle branch block)   . Mitral regurgitation   . Neuropathy    Hx; of B/L feet  . NSTEMI (non-ST elevated myocardial infarction) ([place]) 05/23/2009  .  Osteomyelitis of foot ([place])   . PAD (peripheral artery disease) ([facility])    a. LE PAD (patient previously elected hold off L fem-pop), prev followed by Dr. Bridgett Larsson  . PONV (postoperative nausea and vomiting)   . Sinus headache    "occasionally" (08/27/2012)  . Type II diabetes mellitus ([place])   . Vitamin D deficiency    Past Surgical History:  Procedure Laterality Date  . AMPUTATION Right 08/27/2012   Procedure: AMPUTATION RAY;  Surgeon: Newt Minion, MD;  Location: [place];  Service: Orthopedics;  Laterality: Right;  Right Foot 5th Ray Amputation  . AMPUTATION Left 07/08/2013   Procedure: AMPUTATION RAY;  Surgeon: Newt Minion, MD;  Location: [place];  Service: Orthopedics;  Laterality: Left;  Left Foot 2nd Ray Amputation  . AMPUTATION RAY Right 08/27/2012   5th ray/notes 08/27/2012  . AMPUTATION TOE Right 03/06/2017   Procedure: AMPUTATION TOE, INTERPHANGEAL 2ND RIGHT;  Surgeon: [name], DPM;  Location: [place];  Service: Podiatry;  Laterality: Right;  . AV FISTULA PLACEMENT Right 11/11/2017   Procedure: RIGHT RADIOCEPHALIC  ARTERIOVENOUS FISTULA;  Surgeon: [name], MD;  Location: [place];  Service: Vascular;  Laterality: Right;  . BREAST LUMPECTOMY  04/20/2013   with biopsy      DR WAKEFIELD  . BREAST LUMPECTOMY WITH NEEDLE LOCALIZATION AND AXILLARY SENTINEL LYMPH NODE BX Left 04/20/2013  Procedure: LEFT BREAST WIRE GUIDED LUMPECTOMY AND AXILLARY SENTINEL LYMPH NODE BX;  Surgeon: Rolm Bookbinder, MD;  Location: [place];  Service: General;  Laterality: Left;  . CARDIAC CATHETERIZATION  05/24/2009   Archie Endo 05/24/2009 (08/27/2012)  . CATARACT EXTRACTION W/ INTRAOCULAR LENS  IMPLANT, BILATERAL  2000's  . COLONOSCOPY W/ BIOPSIES AND POLYPECTOMY  2010  . CORONARY ARTERY BYPASS GRAFT  2011   "CABG X4" (08/27/2012)  . DENTAL SURGERY  04/30/11   "1 implant" (08/27/2012)  . DILATION AND CURETTAGE OF UTERUS    . EYE SURGERY    . FINGER SURGERY Right    "index finger; turned out to be gout"  (08/27/2012)  . IR FLUORO GUIDE CV LINE RIGHT  11/30/2017  . TEE WITHOUT CARDIOVERSION N/A 12/01/2017   Procedure: TRANSESOPHAGEAL ECHOCARDIOGRAM (TEE);  Surgeon: Lelon Perla, MD;  Location: [facility];  Service: Cardiovascular;  Laterality: N/A;   Social History   Socioeconomic History  . Marital status: Married    Spouse name: [name]  . Number of children: 1  . Years of education: Not on file  . Highest education level: Not on file  Occupational History  . Not on file  Tobacco Use  . Smoking status: Former Research scientist (life sciences)  . Smokeless tobacco: Never Used  Substance and Sexual Activity  . Alcohol use: Yes    Comment: rarely drinks wine  . Drug use: No  . Sexual activity: Not Currently  Other Topics Concern  . Not on file  Social History Narrative   ** Merged History Encounter **       Social Determinants of Health   Financial Resource Strain:   . Difficulty of Paying Living Expenses: Not on file  Food Insecurity:   . Worried About Charity fundraiser in the Last Year: Not on file  . Ran Out of Food in the Last Year: Not on file  Transportation Needs:   . Lack of Transportation (Medical): Not on file  . Lack of Transportation (Non-Medical): Not on file  Physical Activity:   . Days of Exercise per Week: Not on file  . Minutes of Exercise per Session: Not on file  Stress:   . Feeling of Stress : Not on file  Social Connections:   . Frequency of Communication with Friends and Family: Not on file  . Frequency of Social Gatherings with Friends and Family: Not on file  . Attends Religious Services: Not on file  . Active Member of Clubs or Organizations: Not on file  . Attends Archivist Meetings: Not on file  . Marital Status: Not on file   Family History  Problem Relation Age of Onset  . Kidney cancer Brother 78  . Hypertension Brother   . Breast cancer Sister 68       LCIS; BRCA negative  . Throat cancer Father 57       smoker  . Breast cancer Sister 52        Lobular breast cancer  . Breast cancer Maternal Aunt        dx in her 38s   - negative except otherwise stated in the family history section Allergies  Allergen Reactions  . Atenolol Other (See Comments) and Rash    Exacerbates gout Exacerbates gout  . Adhesive [Tape] Other (See Comments)    SKIN IS VERY SENSITIVE AND BRUISES AND TEARS EASILY; PLEASE USE AN ALTERNATIVE TO TAPE!!  . Contrast Media [Iodinated Diagnostic Agents] Rash  . Iohexol Rash and Other (See  Comments)  . Latex Rash   Prior to Admission medications   Medication Sig Start Date End Date Taking? Authorizing Provider  allopurinol (ZYLOPRIM) 100 MG tablet Take 1 tablet 2 x /day to Prevent Gout Patient taking differently: Take 100 mg by mouth 2 (two) times daily.  10/27/18  Yes Unk Pinto, MD  aspirin EC 81 MG tablet Take 1 tablet (81 mg total) by mouth daily. 05/18/17  Yes Burtis Junes, NP  Aspirin-Acetaminophen-Caffeine (EXCEDRIN EXTRA STRENGTH PO) Take 2 tablets by mouth daily as needed (pain).    Yes [provider]  b complex-vitamin c-folic acid (NEPHRO-VITE) 0.8 MG TABS tablet Take 1 tablet by mouth 3 (three) times a week. Tuesday, Thursday, Saturday 03/19/18  Yes [provider]  cinacalcet (SENSIPAR) 30 MG tablet Take 30 mg by mouth 3 (three) times a week. Takes 1 tablet 3 days a week. Harrell Lark, Sat   Yes [provider]  docusate sodium (COLACE) 100 MG capsule Take 100 mg by mouth at bedtime.   Yes [provider]  fluticasone (FLONASE) 50 MCG/ACT nasal spray Place 2 sprays into both nostrils daily as needed for allergies or rhinitis.   Yes [provider]  hydrALAZINE (APRESOLINE) 25 MG tablet Take 3 tablets (75 mg) 3 x /day for BP & Heart 10/27/18  Yes Unk Pinto, MD  HYDROcodone-acetaminophen (NORCO/VICODIN) 5-325 MG tablet Take 0.5-1 tablets by mouth every 6 (six) hours as needed for severe pain. 03/01/19  Yes Liane Comber, NP  insulin NPH-regular  Human (70-30) 100 UNIT/ML injection Inject 10-30 Units into the skin 2 (two) times daily with a meal.   Yes [provider]  isosorbide dinitrate (ISORDIL) 10 MG tablet Take 1 tablet 3 x /day for BP & Heart Patient taking differently: Take 10 mg by mouth 3 (three) times daily. Take 1 tablet 3 x /day for BP & Heart 10/27/18  Yes Unk Pinto, MD  loratadine (CLARITIN) 10 MG tablet Take 10 mg by mouth daily.   Yes [provider]  Multiple Vitamins-Minerals (PRESERVISION AREDS 2 PO) Take 2 capsules by mouth daily.   Yes [provider]  Probiotic Product (PROBIOTIC DAILY PO) Take 1 tablet by mouth daily.   Yes [provider]  psyllium (METAMUCIL) 58.6 % powder Take 1 packet by mouth 3 (three) times daily.   Yes [provider]  sevelamer carbonate (RENVELA) 800 MG tablet Take 1,600 mg by mouth 3 (three) times daily with meals.  04/20/18  Yes [provider]  traZODone (DESYREL) 50 MG tablet 1/2-2 tablets 1 hour prior to bedtime for sleep. Patient taking differently: Take 25-100 mg by mouth at bedtime. 1/2-2 tablets 1 hour prior to bedtime for sleep. 03/01/19  Yes Liane Comber, NP  BD VEO INSULIN SYRINGE U/F 31G X 15/64" 1 ML MISC USE AS DIRECTED WITH INSULIN 02/06/18   Unk Pinto, MD  glucose blood (PRODIGY NO CODING BLOOD GLUC) test strip CHECK SUGARS 3 TIMES A DAY.[id number] 02/08/18   Unk Pinto, MD   DG Chest 2 View  Result Date: 03/23/2019 CLINICAL DATA:  Left foot ulcer. Sepsis. EXAM: CHEST - 2 VIEW COMPARISON:  Aug 25, 2018. FINDINGS: The heart size and mediastinal contours are within normal limits. No pneumothorax or significant pleural effusion is noted. Status post coronary bypass graft. No acute pulmonary disease is noted. Atherosclerosis of thoracic aorta is noted. The visualized skeletal structures are unremarkable. IMPRESSION: No active cardiopulmonary disease. Aortic atherosclerosis. Electronically Signed   By: Jeneen Rinks  Murlean Caller M.D.   On: 03/23/2019 14:07   MR FOOT LEFT WO CONTRAST  Result Date: 03/23/2019 CLINICAL DATA:  Nonhealing ulcer along the lateral aspect of the foot. EXAM: MRI OF THE LEFT FOOT WITHOUT CONTRAST TECHNIQUE: Multiplanar, multisequence MR imaging of the 03/10/2019 was performed. No intravenous contrast was administered. COMPARISON:  Radiographs 03/10/2019 FINDINGS: Diffuse abnormal T1 and T2 signal intensity in the mid distal aspect of the fifth metatarsal and also in the proximal phalanx. There is joint space narrowing at the fifth MTP joint, small joint effusion and surrounding inflammatory phlegmon and abscess. Findings consistent with septic arthritis at the fifth MTP joint and osteomyelitis involving the fifth metatarsal and fifth proximal phalanx. Remote partial second ray amputation. No other areas of osteomyelitis are identified. Moderate to advanced midfoot degenerative changes but no erosive findings or destructive bony changes. Diffuse subcutaneous soft tissue swelling/edema/fluid suggesting cellulitis. There is also mild diffuse myositis. IMPRESSION: 1. Septic arthritis at the fifth MTP joint with osteomyelitis involving the distal half of the fifth metatarsal and also the proximal phalanx. 2. Surrounding inflammatory phlegmon and abscess. 3. Diffuse cellulitis and myofasciitis. Electronically Signed   By: Marijo Sanes M.D.   On: 03/23/2019 12:07   - pertinent xrays, CT, MRI studies were reviewed and independently interpreted  Positive ROS: All other systems have been reviewed and were otherwise negative with the exception of those mentioned in the HPI and as above.  Physical Exam: General: Alert, no acute distress Psychiatric: Patient is competent for consent with normal mood and affect Lymphatic: No axillary or cervical lymphadenopathy Cardiovascular: No pedal edema Respiratory: No cyanosis, no use of accessory musculature GI: No organomegaly, abdomen is soft and  non-tender    Images:  _0 @  Labs:  Lab Results  Component Value Date   HGBA1C 6.3 (H) 01/10/2019   HGBA1C 5.4 03/22/2018   HGBA1C 6.1 (H) 11/26/2017   ESRSEDRATE 9 05/13/2018   ESRSEDRATE 96 (H) 02/18/2017   ESRSEDRATE 63 (H) 06/04/2013   CRP 3.0 05/13/2018   CRP 57.0 (H) 02/18/2017   LABURIC 3.6 01/10/2019   LABURIC 6.2 08/04/2017   LABURIC 5.3 07/01/2016   REPTSTATUS 03/18/2019 FINAL 03/16/2019   GRAMSTAIN  03/16/2019    NO WBC SEEN NO ORGANISMS SEEN Performed at [facility], [place] [address], [place], [place] [address]    CULT FEW SERRATIA MARCESCENS 03/16/2019   [facility] SERRATIA MARCESCENS 03/16/2019    Lab Results  Component Value Date   ALBUMIN 2.8 (L) 03/23/2019   ALBUMIN 3.2 (L) 08/25/2018   ALBUMIN 3.4 (L) 02/10/2018   LABURIC 3.6 01/10/2019   LABURIC 6.2 08/04/2017   LABURIC 5.3 07/01/2016    Neurologic: Patient does not have protective sensation bilateral lower extremities.   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  There is cellulitis.  Patient has a purulent ulcer in the midfoot on the right.  Patient states that she has undergone serial debridement with podiatry and the wound center for this ulcer.  Patient has gangrenous ulcer of the little toe with osteomyelitis left foot.  Patient does not have a palpable pulse bilaterally.  Previous arterial study shows bilateral SFA occlusions.  Patient's glucose is under good control she does have moderate protein caloric malnutrition with an albumin of 2.8.  Assessment: Assessment:  Critical limb ischemia bilateral lower extremities with arterial ulcers on both legs abscess on the mid plantar aspect of the right foot with gangrene and osteomyelitis of the little toe on the left  foot.  Plan: Plan: Anticipate patient will undergo dialysis today arterial studies with vascular vein surgery intervention with Dr. Carlis Abbott tomorrow.  Once patient's arterial status is improved I could proceed with a ray amputation of the left foot.  I am also concerned with the abscess and ischemic ulcers on the right foot and right leg.  Surgical intervention from an orthopedic standpoint pending vascular intervention status.  Thank you for the consult and the opportunity to see Ms. Lezlie Octave, MD [facility] [phone] 7:43 AM

## 2019-03-24 NOTE — Progress Notes (Signed)
Mady Gemma, RN called and notified the patient's HD treatment has been moved to 03/25/19.

## 2019-03-24 NOTE — Progress Notes (Addendum)
Pharmacy Antibiotic Note  [name] is a 73 y.o. female admitted on 03/23/2019 with foot wound with imaging showing septic arthritis with osteomyelitis, surrounding inflammatory phlegmon and abscess, diffuse cellulitis and myofascitis.   Continues on Vancomycin (on prior to admission) / Adding Zosyn (one time dose given) Vancomycin random level this PM pre HD = 33 (high)  Wound culture from 12/9 at [facility] grew few serratia marcescens.  Per Dr. Posey Pronto, he will wait to enter HD orders until after her surgery timing is confirmed.  Plan: No Vancomycin dose today  Monitor clinical progress, c/s, abx plan/LOT Pre-HD vancomycin level as indicated F/u HD schedule/tolerance inpatient to enter vancomycin maintenance doses  Zosyn 2.25 grams iv Q 8 hours   Height: 5' 4.5" (163.8 cm) Weight: 161 lb 15.9 oz (73.5 kg) IBW/kg (Calculated) : 55.85  Temp (24hrs), Avg:97.9 F (36.6 C), Min:97.7 F (36.5 C), Max:98 F (36.7 C)  Recent Labs  Lab 03/23/19 1338 03/23/19 1355 03/23/19 1655 03/24/19 1407  WBC  --  10.7*  --   --   CREATININE  --  3.40*  --  4.29*  LATICACIDVEN 1.1  --  1.0  --   VANCORANDOM  --   --   --  33    Estimated Creatinine Clearance: 11.6 mL/min (A) (by C-G formula based on SCr of 4.29 mg/dL (H)).    Allergies  Allergen Reactions  . Atenolol Other (See Comments) and Rash    Exacerbates gout Exacerbates gout  . Adhesive [Tape] Other (See Comments)    SKIN IS VERY SENSITIVE AND BRUISES AND TEARS EASILY; PLEASE USE AN ALTERNATIVE TO TAPE!!  . Contrast Media [Iodinated Diagnostic Agents] Rash  . Iohexol Rash and Other (See Comments)  . Latex Rash    Anette Guarneri, PharmD Please check AMION for all [facility] contact numbers Clinical Pharmacist 03/24/2019 3:13 PM

## 2019-03-24 NOTE — Progress Notes (Signed)
PROGRESS NOTE   [name]  ZOX:[id number]    DOB: 02/12/1946    DOA: 03/23/2019  PCP: Unk Pinto, MD   I have briefly reviewed patients previous medical records in [facility].  Chief Complaint:  Patient sent from wound care center due to infected left foot wound that failed outpatient antibiotics.  Brief Narrative:  73 year old married female, independent, PMH of CAD s/p CABG, chronic combined CHF/ICM, stage IV CKD (baseline creatinine 2.7-3.1), HTN, HLD, DM, lower extremity PAD (bilateral SFA occlusion 07/2018), chronic bilateral feet ulcerations, osteomyelitis of left toe, LBBB who has had a chronic wound on her left foot that is followed at the wound care center closely, recently noted worsening infection and started on IV antibiotics including vancomycin at dialysis (reportedly completed 3 doses) sent to [facility] from the wound care center due to low-grade fevers and increased drainage from the ulcer.  Admitted for left little toe osteomyelitis, abscess and sepsis complicating PAD.  Nephrology consulted for HD needs.  VVS plan lower extremity arteriogram with intervention 12/18.  Orthopedics/Dr. Sharol Given planned surgical intervention after vascular procedure.   Assessment & Plan:  Active Problems:   PVD (peripheral vascular disease) ([place])   ESRD on hemodialysis ([place])   Pyogenic inflammation of bone (HCC)   Osteomyelitis of foot, left, acute (HCC)   Septic arthritis of left foot (HCC)   Cellulitis of left foot   Abscess of right foot   Left little toe infected chronic ulcer with abscess and osteomyelitis, complicating PAD and peripheral neuropathy.  Vascular surgery and orthopedics consultation appreciated.  Dr. Carlis Abbott, VVS plans aortogram, lower extremity arteriogram and possible intervention 12/18.  Dr. Sharol Given, Orthopedics will consider surgical intervention pending vascular intervention status.  Failed outpatient antibiotic treatment.  Continue  empirically started IV vancomycin and Zosyn.  Wound culture OP showed Serratia.  Blood cultures x2 from 12/16: Negative to date.  MRSA PCR negative.  Chronic right foot, bilateral leg ulcers  Patient has bilateral leg ulcers, suspected arterial ulcers from PAD.  Fairly clean.  She also has chronic right foot mid plantar ulcer with some slough but no overt drainage.  Orthopedics may consider intervention pending vascular surgery clearance.  PAD  Management as per vascular surgery as noted above.  Type II DM with peripheral neuropathy  Recent A1c 6.3.  Reports occasional hypoglycemic symptoms on home regimen of 70/30 insulin.  Currently only on SSI with widely fluctuating CBGs as noted below.  Adjust SSI and monitor closely.  Will consider maintenance long-acting insulin/Levemir if persistently high CBGs.  ESRD on TTS HD  Nephrology consulted and plan to dialyze today.  Essential hypertension  Controlled.  Continue hydralazine 25 mg 3 times daily and isosorbide dinitrate 10 mg 3 times daily.  Chronic systolic CHF  Compensated.  Gout  No acute flare.  Continue allopurinol.  Anemia of chronic kidney disease  Stable.   DVT prophylaxis: Subcutaneous heparin Code Status: Full Family Communication: None at bedside Disposition: To be determined pending interventions and clinical improvement, likely to be several more days.   Consultants:   Nephrology Vascular surgery Orthopedics  Procedures:   None  Antimicrobials:   IV vancomycin and Zosyn 12/16 >   Subjective:  Patient denies complaints.  No pain reported.  Indicates that she is independent at home but when she goes out she uses a hiking stick to walk.  Objective:   Vitals:   03/23/19 1819 03/23/19 2123 03/24/19 0447 03/24/19 0605  BP: (!) 142/61 (!) 104/55 Marland Kitchen)  139/56   Pulse: 85 78 86   Resp: 18 16 16    Temp: 98 F (36.7 C)  97.7 F (36.5 C)   TempSrc:      SpO2: 98% 90% 95%   Weight:  73.5 kg      Height:    5' 4.5" (1.638 m)    General exam: Pleasant elderly female, moderately built and nourished sitting up comfortably in bed without distress. Respiratory system: Clear to auscultation. Respiratory effort normal. Cardiovascular system: S1 & S2 heard, RRR. No JVD, murmurs, rubs, gallops or clicks. No pedal edema.   Gastrointestinal system: Abdomen is nondistended, soft and nontender. No organomegaly or masses felt. Normal bowel sounds heard. Central nervous system: Alert and oriented. No focal neurological deficits. Extremities: Symmetric 5 x 5 power. Skin: She has what appears to be an arterial ulcer on medial aspect of both calves with some slough but no obvious drainage or cellulitis features.  She has approximately 1 cm diameter right plantar midfoot ulcer without drainage, has surrounding callus formation.  She has a ulcer on the lateral aspect of the left little toe with purulent drainage and surrounding patchy mild erythema of dorsum of foot.  Unable to palpate bilateral dorsalis pedis or posterior tibials. Psychiatry: Judgement and insight appear normal. Mood & affect appropriate.     Data Reviewed:   I have personally reviewed following labs and imaging studies   CBC: Recent Labs  Lab 03/23/19 1355  WBC 10.7*  NEUTROABS 8.0*  HGB 10.8*  HCT 33.1*  MCV 94.6  PLT 024    Basic Metabolic Panel: Recent Labs  Lab 03/23/19 1355 03/24/19 1407  NA 138 135  K 4.1 4.5  CL 98 96*  CO2 27 25  GLUCOSE 70 230*  BUN 40* 63*  CREATININE 3.40* 4.29*  CALCIUM 8.4* 8.1*  PHOS  --  4.5    Liver Function Tests: Recent Labs  Lab 03/23/19 1355 03/24/19 1407  AST 22  --   ALT 23  --   ALKPHOS 192*  --   BILITOT 0.5  --   PROT 5.9*  --   ALBUMIN 2.8* 2.7*    CBG: Recent Labs  Lab 03/23/19 2120 03/24/19 0654 03/24/19 1120  GLUCAP 114* 83 346*    Microbiology Studies:   Recent Results (from the past 240 hour(s))  Aerobic Culture (superficial specimen)      Status: None   Collection Time: 03/16/19 11:30 AM   Specimen: Wound  Result Value Ref Range Status   Specimen Description   Final    WOUND LEFT LATERAL FOOT Performed at [facility] [address] [place], [place] [address]    Special Requests   Final    NONE Performed at [facility], [place] [address], [place], [place] [address]    Gram Stain   Final    NO WBC SEEN NO ORGANISMS SEEN Performed at [facility], [place] [address], [place], [place] [address]    Culture FEW SERRATIA MARCESCENS  Final   Report Status 03/18/2019 FINAL  Final   Organism ID, Bacteria SERRATIA MARCESCENS  Final      Susceptibility   Serratia marcescens - MIC*    CEFAZOLIN >=64 RESISTANT Resistant     CEFEPIME <=1 SENSITIVE Sensitive     CEFTAZIDIME <=1 SENSITIVE Sensitive     CEFTRIAXONE <=1 SENSITIVE Sensitive     CIPROFLOXACIN <=0.25 SENSITIVE Sensitive     GENTAMICIN <=1 SENSITIVE Sensitive  TRIMETH/SULFA <=20 SENSITIVE Sensitive     * FEW SERRATIA MARCESCENS  Culture, blood (Routine x 2)     Status: None (Preliminary result)   Collection Time: 03/23/19  1:38 PM   Specimen: BLOOD  Result Value Ref Range Status   Specimen Description BLOOD LEFT ANTECUBITAL  Final   Special Requests   Final    BOTTLES DRAWN AEROBIC AND ANAEROBIC Blood Culture results may not be optimal due to an inadequate volume of blood received in culture bottles   Culture   Final    NO GROWTH < 24 HOURS Performed at [facility], [place] [address], [place], [place] [address]    Report Status PENDING  Incomplete  Culture, blood (Routine x 2)     Status: None (Preliminary result)   Collection Time: 03/23/19  3:38 PM   Specimen: BLOOD  Result Value Ref Range Status   Specimen Description BLOOD BLOOD LEFT FOREARM  Final   Special Requests   Final    BOTTLES DRAWN AEROBIC AND ANAEROBIC Blood Culture results may not be optimal due to an excessive volume of blood received in culture  bottles   Culture   Final    NO GROWTH < 24 HOURS Performed at [facility], [place] [address] [place], [place] [address]    Report Status PENDING  Incomplete  SARS CORONAVIRUS 2 (TAT 6-24 HRS) Nasopharyngeal Nasopharyngeal Swab     Status: None   Collection Time: 03/23/19  3:57 PM   Specimen: Nasopharyngeal Swab  Result Value Ref Range Status   SARS Coronavirus 2 NEGATIVE NEGATIVE Final    Comment: (NOTE) SARS-CoV-2 target nucleic acids are NOT DETECTED. The SARS-CoV-2 RNA is generally detectable in upper and lower respiratory specimens during the acute phase of infection. Negative results do not preclude SARS-CoV-2 infection, do not rule out co-infections with other pathogens, and should not be used as the sole basis for treatment or other patient management decisions. Negative results must be combined with clinical observations, patient history, and epidemiological information. The expected result is Negative. Fact Sheet for Patients: SugarRoll.be Fact Sheet for Healthcare Providers: https://www.woods-mathews.com/ This test is not yet approved or cleared by the [place] FDA and  has been authorized for detection and/or diagnosis of SARS-CoV-2 by FDA under an Emergency Use Authorization (EUA). This EUA will remain  in effect (meaning this test can be used) for the duration of the COVID-19 declaration under Section 56 4(b)(1) of the Act, 21 U.S.C. section 360bbb-3(b)(1), unless the authorization is terminated or revoked sooner. Performed at [facility], [place] [address] [place], [place] [address]   MRSA PCR Screening     Status: None   Collection Time: 03/24/19  6:10 AM   Specimen: Nasal Mucosa; Nasopharyngeal  Result Value Ref Range Status   MRSA by PCR NEGATIVE NEGATIVE Final    Comment:        The GeneXpert MRSA Assay (FDA approved for NASAL specimens only), is one component of a comprehensive MRSA  colonization surveillance program. It is not intended to diagnose MRSA infection nor to guide or monitor treatment for MRSA infections. Performed at [facility], [place] [address], [place], [place] [address]      Radiology Studies:  No results found.   Scheduled Meds:   . allopurinol  100 mg Oral BID  . aspirin EC  81 mg Oral Daily  . Chlorhexidine Gluconate Cloth  6 each Topical Q0600  . cinacalcet  30 mg Oral  Once per day on Tue Thu Sat  . darbepoetin (ARANESP) injection - DIALYSIS  60 mcg Intravenous Q Thu-HD  . doxercalciferol  4 mcg Intravenous Q T,Th,Sa-HD  . heparin  5,000 Units Subcutaneous Q12H  . hydrALAZINE  25 mg Oral Q8H  . insulin aspart  0-6 Units Subcutaneous TID WC  . isosorbide dinitrate  10 mg Oral TID  . traZODone  25-100 mg Oral QHS  . vancomycin variable dose per unstable renal function (pharmacist dosing)   Does not apply See admin instructions    Continuous Infusions:     LOS: 1 day     Vernell Leep, MD, [place], [facility]. [facility]    To contact the attending provider between 7A-7P or the covering provider during after hours 7P-7A, please log into the web site [email or web address] and access using universal [place] password for that web site. If you do not have the password, please call the hospital operator.  03/24/2019, 4:06 PM

## 2019-03-24 NOTE — Progress Notes (Signed)
[facility] Progress Note  Name: [name] MRN: [id number] DOB: 06-02-1945  Chief Complaint:  Directed to ER for Left foot septic arthritis   Subjective:  Ortho and vascular have seen - plan for arteriogram with vascular then reassessment by ortho.  States plan is for imaging tomorrow.  She feels ok today - having some pain and they are working on this.   Review of systems:  Denies n/v Denies shortness of breath  Denies CP  ----------------- Background on consult:  73 year old Caucasian woman with past medical history significant for coronary artery disease status post CABG with ischemic cardiomyopathy and combined systolic/diastolic congestive heart failure, hypertension, poorly controlled type 2 diabetes mellitus, peripheral vascular disease status post revascularization procedures in the past with toe amputations, breast cancer status post lumpectomy/radiation and end-stage renal disease on hemodialysis on a Tuesday/Thursday/Saturday schedule.  She has been going to the wound care center for the last 3 weeks for management of bilateral lower extremity ulcers and on her visit today was directed to the emergency room after MRI of the left foot yesterday showed septic arthritis of the fifth MTP joint with osteomyelitis of the fifth metatarsal/proximal phalanx as well as surrounding inflammatory phlegmon and abscess with diffuse cellulitis and myofasciitis.  She has been on intravenous vancomycin for the last week through dialysis.  She reports some low-grade fevers on measurement at home but this is not corroborated with measurement at dialysis.  She denies any chest pain or shortness of breath.  She denies any nausea, vomiting or diarrhea and continues to have good appetite.  She denies any dysuria (makes about 300 cc yellow urine a day).  She has been evaluated by Dr. Carlis Abbott of vascular surgery who suspects that she will need to undergo left leg revascularization based on Dr.  Jess Barters assessment that is pending regarding surgical intervention with amputation.   Intake/Output Summary (Last 24 hours) at 03/24/2019 1600 Last data filed at 03/24/2019 1500 Gross per 24 hour  Intake 320 ml  Output 0 ml  Net 320 ml    Vitals:  Vitals:   03/23/19 1819 03/23/19 2123 03/24/19 0447 03/24/19 0605  BP: (!) 142/61 (!) 104/55 (!) 139/56   Pulse: 85 78 86   Resp: 18 16 16    Temp: 98 F (36.7 C)  97.7 F (36.5 C)   TempSrc:      SpO2: 98% 90% 95%   Weight:  73.5 kg    Height:    5' 4.5" (1.638 m)     Physical Exam:  General adult female in bed in no acute distress HEENT normocephalic atraumatic extraocular movements intact sclera anicteric Neck supple trachea midline Lungs clear to auscultation bilaterally normal work of breathing at rest  Heart regular rate and rhythm no rubs or gallops appreciated Abdomen soft nontender nondistended Extremities trace LE edema; lower legs painful to touch  Psych normal mood and affect Access RUE AVF with bruit and thrill   Medications reviewed   Labs:  BMP Latest Ref Rng & Units 03/24/2019 03/23/2019 01/10/2019  Glucose 70 - 99 mg/dL 230(H) 70 156(H)  BUN 8 - 23 mg/dL 63(H) 40(H) 35(H)  Creatinine 0.44 - 1.00 mg/dL 4.29(H) 3.40(H) 3.17(H)  BUN/Creat Ratio 6 - 22 (calc) - - 11  Sodium 135 - 145 mmol/L 135 138 137  Potassium 3.5 - 5.1 mmol/L 4.5 4.1 4.0  Chloride 98 - 111 mmol/L 96(L) 98 99  CO2 22 - 32 mmol/L 25 27 27   Calcium 8.9 - 10.3  mg/dL 8.1(L) 8.4(L) 8.9   Dialysis prescription: [facility], TTS, EDW 72 kg, BFR 350, DFR 800, Optiflux 180, 3K/2.25 calcium, vancomycin 750 mg q. HD, Mircera 50 mcg every 2 weeks, Hectorol 4 mcg IV 3 times weekly, heparin 5000 unit bolus.  Right radiocephalic fistula.   Assessment/Plan:   1.  Left foot fifth metatarsal osteomyelitis/septic arthritis: On intravenous antibiotic therapy initiated as an outpatient and continued here.   - Ongoing evaluation by  vascular surgery for possible revascularization procedure  - further eval per ortho   2.  End-stage renal disease: Usually on a Tuesday/Thursday/Saturday dialysis schedule as an outpatient.  - try to keep on TTS schedule as inpatient volumes allow however anticipate her tx will be on 12/18 as no emergent need for HD today  3.  Anemia of chronic kidney disease: Likely to have significant inflammatory complex with ongoing labile source/osteomyelitis  - on ESA (aranesp 60 ordered for 12/17)  4.  Secondary hyperparathyroidism:  - continue hectorol; Sensipar is ordered 30 mg three times a week - pt states this was just increased to daily; add home regimen of renvela two with meals and one with snack  5.  Hypertension: acceptable control   6.  Nutrition: With low albumin likely arising from infection/wound, continue renal diet with protein supplements.  Claudia Desanctis, MD 03/24/2019 4:00 PM

## 2019-03-24 NOTE — Progress Notes (Signed)
Patient is scheduled for aortogram, lower extremity arteriogram, possible intervention in the PV lab with me tomorrow.  Will work on left leg first given progressive wound on left leg and osteomyelitis and reason for admission.  Please have patient NPO after midnight.  Appreciate Dr. Jess Barters input as well.  Consent order placed in chart.  Marty Heck, MD [facility] Office: [phone] Pager: [place]

## 2019-03-24 NOTE — Plan of Care (Signed)
  Problem: Education: Goal: Knowledge of General Education information will improve Description Including pain rating scale, medication(s)/side effects and non-pharmacologic comfort measures Outcome: Progressing   

## 2019-03-24 NOTE — Plan of Care (Signed)
  Problem: Education: Goal: Knowledge of General Education information will improve Description: Including pain rating scale, medication(s)/side effects and non-pharmacologic comfort measures Outcome: Progressing   Problem: Nutrition: Goal: Adequate nutrition will be maintained Outcome: Progressing   Problem: Coping: Goal: Level of anxiety will decrease Outcome: Progressing   Problem: Elimination: Goal: Will not experience complications related to bowel motility Outcome: Progressing Goal: Will not experience complications related to urinary retention Outcome: Progressing   Problem: Pain Managment: Goal: General experience of comfort will improve Outcome: Progressing   

## 2019-03-24 NOTE — Progress Notes (Signed)
NICA, FRISKE ([id number]) Visit Report for 03/23/2019 Arrival Information Details Patient Name: Date of Service: [name], [name] 03/23/2019 10:45 AM Medical Record [id number] Patient Account Number: [id number] Date of Birth/Sex: Treating RN: 05/24/1945 (73 y.o. Helene Shoe, Meta.Reding Primary Care Tina Gruner: [name] Other Clinician: Referring Wilbur Oakland: Treating Kiffany Schelling/Extender:Stone III, Lemar Livings, Abelino Derrick in Treatment: 4 Visit Information History Since Last Visit Added or deleted any medications: No Patient Arrived: Wheel Chair Any new allergies or adverse reactions: No Arrival Time: 10:47 Had a fall or experienced change in No Accompanied By: self activities of daily living that may affect Transfer Assistance: None risk of falls: Patient Identification Verified: Yes Signs or symptoms of abuse/neglect since last No Secondary Verification Process Yes visito Completed: Hospitalized since last visit: No Patient Requires Transmission- No Implantable device outside of the clinic excluding No Based Precautions: cellular tissue based products placed in the center Patient Has Alerts: Yes since last visit: Patient Alerts: R ABI non Has Dressing in Place as Prescribed: Yes compressible Has Compression in Place as Prescribed: Yes L ABI non Pain Present Now: No compressible Electronic Signature(s) Signed: 03/23/2019 5:21:15 PM By: Deon Pilling Entered By: Deon Pilling on 03/23/2019 11:09:45 -------------------------------------------------------------------------------- Encounter Discharge Information Details Patient Name: Date of Service: [name] 03/23/2019 10:45 AM Medical Record [id number] Patient Account Number: [id number] Date of Birth/Sex: Treating RN: 03/04/1946 (73 y.o. Debby Bud Primary Care Sondra Blixt: [name] Other Clinician: Referring Angelina Neece: Treating Venise Ellingwood/Extender:Stone III, Lemar Livings,  Abelino Derrick in Treatment: 4 Encounter Discharge Information Items Post Procedure Vitals Discharge Condition: Stable Temperature (F): 98.9 Ambulatory Status: Wheelchair Pulse (bpm): 93 Discharge Destination: Emergency Room Respiratory Rate (breaths/min): 20 Telephoned: No Blood Pressure (mmHg): 145/60 Orders Sent: Yes Transportation: Private Auto son to take patient over to Accompanied By: ED. Schedule Follow-up Yes Appointment: Clinical Summary of Care: Electronic Signature(s) Signed: 03/23/2019 5:21:15 PM By: Deon Pilling Entered By: Deon Pilling on 03/23/2019 12:02:11 -------------------------------------------------------------------------------- Lower Extremity Assessment Details Patient Name: Date of Service: [name], Pamela 03/23/2019 10:45 AM Medical Record [id number] Patient Account Number: [id number] Date of Birth/Sex: Treating RN: [birth year] (73 y.o. Helene Shoe, Meta.Reding Primary Care Efe Fazzino: [name] Other Clinician: Referring Cosimo Schertzer: Treating Varick Keys/Extender:Stone III, Lemar Livings, Lendon Ka Weeks in Treatment: 4 Edema Assessment Assessed: [Left: Yes] [Right: Yes] Edema: [Left: Yes] [Right: No] Calf Left: Right: Point of Measurement: 32 cm From Medial Instep 29.5 cm 28 cm Ankle Left: Right: Point of Measurement: 10 cm From Medial Instep 20 cm 18.5 cm Electronic Signature(s) Signed: 03/23/2019 5:21:15 PM By: Deon Pilling Entered By: Deon Pilling on 03/23/2019 11:10:34 -------------------------------------------------------------------------------- [place] Details Patient Name: Date of Service: [name] 03/23/2019 10:45 AM Medical Record [id number] Patient Account Number: [id number] Date of Birth/Sex: Treating RN: 19-Sep-1945 (73 y.o. [name] Primary Care Kailen Hinkle: [name] Other Clinician: Referring Del Wiseman: Treating Rosalena Mccorry/Extender:Stone III, Lemar Livings, Abelino Derrick in Treatment: 4 Active Inactive Nutrition Nursing Diagnoses: Impaired glucose control: actual or potential Potential for alteratiion in Nutrition/Potential for imbalanced nutrition Goals: Patient/caregiver will maintain therapeutic glucose control Date Initiated: 02/23/2019 Target Resolution Date: 04/22/2019 Goal Status: Active Interventions: Assess HgA1c results as ordered upon admission and as needed Provide education on elevated blood sugars and impact on wound healing Treatment Activities: Patient referred to Primary Care Physician for further nutritional evaluation : 02/23/2019 Notes: Venous Leg Ulcer Nursing Diagnoses: Knowledge deficit related to disease process and management Potential for venous Insuffiency (use before diagnosis confirmed) Goals: Patient  will maintain optimal edema control Date Initiated: 02/23/2019 Target Resolution Date: 04/22/2019 Goal Status: Active Interventions: Compression as ordered Treatment Activities: Therapeutic compression applied : 02/23/2019 Notes: Wound/Skin Impairment Nursing Diagnoses: Impaired tissue integrity Knowledge deficit related to ulceration/compromised skin integrity Goals: Patient/caregiver will verbalize understanding of skin care regimen Date Initiated: 02/23/2019 Target Resolution Date: 04/22/2019 Goal Status: Active Ulcer/skin breakdown will have a volume reduction of 30% by week 4 Date Initiated: 02/23/2019 Date Inactivated: 03/23/2019 Target12/16/2020 Resolution Date: Unmet Reason: necrotic Goal Status: Unmet surface, infection Interventions: Assess patient/caregiver ability to obtain necessary supplies Assess patient/caregiver ability to perform ulcer/skin care regimen upon admission and as needed Assess ulceration(s) every visit Treatment Activities: Skin care regimen initiated : 02/23/2019 Topical wound management initiated : 02/23/2019 Notes: Electronic Signature(s) Signed: 03/24/2019  5:32:06 PM By: [name] RN, BSN Entered By: [name] on 03/23/2019 11:42:10 -------------------------------------------------------------------------------- Pain Assessment Details Patient Name: Date of Service: [name] 03/23/2019 10:45 AM Medical Record [id number] Patient Account Number: [id number] Date of Birth/Sex: Treating RN: [birth year] (74 y.o. Debby Bud Primary Care Antoinette Borgwardt: [name] Other Clinician: Referring Laurieann Friddle: Treating Omega Slager/Extender:Stone III, Lemar Livings, Lendon Ka Weeks in Treatment: 4 Active Problems Location of Pain Severity and Description of Pain Patient Has Paino No Site Locations Rate the pain. Current Pain Level: 0 Pain Management and Medication Current Pain Management: Medication: No Cold Application: No Rest: No Massage: No Activity: No T.E.N.S.: No Heat Application: No Leg drop or elevation: No Is the Current Pain Management Adequate: Adequate How does your wound impact your activities of daily livingo Sleep: No Bathing: No Appetite: No Relationship With Others: No Bladder Continence: No Emotions: No Bowel Continence: No Work: No Toileting: No Drive: No Dressing: No Hobbies: No Electronic Signature(s) Signed: 03/23/2019 5:21:15 PM By: Deon Pilling Entered By: Deon Pilling on 03/23/2019 11:10:15 -------------------------------------------------------------------------------- Patient/Caregiver Education Details Patient Name: [name] 12/16/2020andnbsp10:45 Date of Service: AM Medical Record [id number] Number: Patient Account Number: [id number] Treating RN: Date of Birth/Gender: 07-Sep-1945 (73 y.o. [name]) Other Clinician: Primary Care Physician:McKeown, Debby Freiberg Worthy Keeler Referring Physician: Physician/Extender: Stevphen Meuse in Treatment: 4 Education Assessment Education Provided To: Patient Education Topics Provided Wound/Skin  Impairment: Methods: Explain/Verbal Responses: State content correctly Electronic Signature(s) Signed: 03/24/2019 5:32:06 PM By: [name] RN, BSN Entered By: [name] on 03/23/2019 11:42:36 -------------------------------------------------------------------------------- Wound Assessment Details Patient Name: Date of Service: [name] 03/23/2019 10:45 AM Medical Record [id number] Patient Account Number: [id number] Date of Birth/Sex: Treating RN: [birth year] (73 y.o. Helene Shoe, Meta.Reding Primary Care Elridge Stemm: [name] Other Clinician: Referring Markea Ruzich: Treating Caroline Longie/Extender:Stone III, Lemar Livings, Lendon Ka Weeks in Treatment: 4 Wound Status Wound Number: 2 Primary Diabetic Wound/Ulcer of the Lower Extremity Etiology: Wound Location: Right Foot - Plantar Wound Open Wounding Event: Gradually Appeared Status: Date Acquired: 12/06/2017 Comorbid Cataracts, Congestive Heart Failure, Coronary Weeks Of Treatment: 4 History: Artery Disease, Hypertension, Peripheral Clustered Wound: No Arterial Disease, Peripheral Venous Disease, Type II Diabetes, End Stage Renal Disease, Osteoarthritis, Neuropathy, Received Radiation Photos Wound Measurements Length: (cm) 0.5 Width: (cm) 0.8 Depth: (cm) 0.5 Area: (cm) 0.314 Volume: (cm) 0.157 Wound Description Classification: Grade 2 Wound Margin: Thickened Exudate Amount: Medium Exudate Type: Serosanguineous Exudate Color: red, brown Wound Bed Granulation Amount: Large (67-100%) Granulation Quality: Red Necrotic Amount: None Present (0%) Cleansing: No No Exposed Structure No taneous Tissue) Exposed: Yes No No No No % Reduction in Area: 50% % Reduction in Volume: 50% Epithelialization: Medium (34-66%) Tunneling: No  Undermining: No Foul Odor After Slough/Fibrino Fascia Exposed: Fat Layer (Subcu Tendon Exposed: Muscle Exposed: Joint Exposed: Bone Exposed: Treatment Notes Wound #2  (Right, Plantar Foot) 1. Cleanse With Wound Cleanser Soap and water 3. Primary Dressing Applied Calcium Alginate Ag 4. Secondary Dressing ABD Pad Dry Gauze Roll Gauze 5. Secured With Medipore tape Notes applied primary and secondary dressing. Sent patient to ED per PA r/t infection to left lateral foot. Electronic Signature(s) Signed: 03/24/2019 3:39:50 PM By: Mikeal Hawthorne EMT/HBOT Signed: 03/24/2019 5:34:18 PM By: Deon Pilling Previous Signature: 03/23/2019 5:21:15 PM Version By: Deon Pilling Entered By: Mikeal Hawthorne on 03/24/2019 14:13:30 -------------------------------------------------------------------------------- Wound Assessment Details Patient Name: Date of Service: [name] 03/23/2019 10:45 AM Medical Record [id number] Patient Account Number: [id number] Date of Birth/Sex: Treating RN: Oct 18, 1945 (73 y.o. Helene Shoe, Meta.Reding Primary Care Jaymarie Yeakel: [name] Other Clinician: Referring Biana Haggar: Treating Benay Pomeroy/Extender:Stone III, Lemar Livings, Lendon Ka Weeks in Treatment: 4 Wound Status Wound Number: 4 Primary Venous Leg Ulcer Etiology: Wound Location: Right Lower Leg - Medial Wound Open Wounding Event: Blister Status: Date Acquired: 01/06/2019 Comorbid Cataracts, Congestive Heart Failure, Coronary Weeks Of Treatment: 4 History: Artery Disease, Hypertension, Peripheral Clustered Wound: No Arterial Disease, Peripheral Venous Disease, Type II Diabetes, End Stage Renal Disease, Osteoarthritis, Neuropathy, Received Radiation Photos Wound Measurements Length: (cm) 4 % Red Width: (cm) 3.9 % Red Depth: (cm) 0.1 Epith Area: (cm) 12.252 Tunn Volume: (cm) 1.225 Unde Wound Description Full Thickness Without Exposed Support Foul Classification: Structures Slou Wound Flat and Intact Margin: Exudate Medium Amount: Exudate Serosanguineous Type: Exudate red, brown Color: Wound Bed Granulation Amount: Medium  (34-66%) Granulation Quality: Red, Pink, Hyper-granulation Fasc Necrotic Amount: Medium (34-66%) Fat Necrotic Quality: Adherent Slough Tend Musc Join Bone Odor After Cleansing: No gh/Fibrino Yes Exposed Structure ia Exposed: No Layer (Subcutaneous Tissue) Exposed: Yes on Exposed: No le Exposed: No t Exposed: No Exposed: No uction in Area: 24.6% uction in Volume: 24.7% elialization: Small (1-33%) eling: No rmining: No Treatment Notes Wound #4 (Right, Medial Lower Leg) 1. Cleanse With Wound Cleanser Soap and water 3. Primary Dressing Applied Calcium Alginate Ag 4. Secondary Dressing ABD Pad Dry Gauze Roll Gauze 5. Secured With Medipore tape Notes applied primary and secondary dressing. Sent patient to ED per PA r/t infection to left lateral foot. Electronic Signature(s) Signed: 03/24/2019 3:39:50 PM By: Mikeal Hawthorne EMT/HBOT Signed: 03/24/2019 5:34:18 PM By: Deon Pilling Previous Signature: 03/23/2019 5:21:15 PM Version By: Deon Pilling Entered By: Mikeal Hawthorne on 03/24/2019 14:13:50 -------------------------------------------------------------------------------- Wound Assessment Details Patient Name: Date of Service: [name] 03/23/2019 10:45 AM Medical Record [id number] Patient Account Number: [id number] Date of Birth/Sex: Treating RN: 08-May-1945 (73 y.o. Helene Shoe, Meta.Reding Primary Care Fathima Bartl: [name] Other Clinician: Referring Anthonie Lotito: Treating Mersadies Petree/Extender:Stone III, Lemar Livings, Lendon Ka Weeks in Treatment: 4 Wound Status Wound Number: 5 Primary Venous Leg Ulcer Etiology: Wound Location: Left Lower Leg - Medial Wound Open Wounding Event: Blister Status: Date Acquired: 01/06/2019 Comorbid Cataracts, Congestive Heart Failure, Coronary Weeks Of Treatment: 4 History: Artery Disease, Hypertension, Peripheral Clustered Wound: No Arterial Disease, Peripheral Venous Disease, Type II Diabetes, End Stage Renal  Disease, Osteoarthritis, Neuropathy, Received Radiation Photos Wound Measurements Length: (cm) 2.2 % Reduct Width: (cm) 1.5 % Reduct Depth: (cm) 0.1 Epitheli Area: (cm) 2.592 Tunneli Volume: (cm) 0.259 Undermi Wound Description Classification: Full Thickness Without Exposed Support Foul Od Structures Slough/ Wound Flat and Intact Margin: Exudate Medium Amount: Exudate Serosanguineous Type: Exudate red, brown Color: Wound Bed Granulation Amount: Small (  1-33%) Granulation Quality: Pink Fascia E Necrotic Amount: Large (67-100%) Fat Laye Necrotic Quality: Adherent Slough Tendon E Muscle E Joint Ex Bone Exp or After Cleansing: No Fibrino Yes Exposed Structure xposed: No r (Subcutaneous Tissue) Exposed: Yes xposed: No xposed: No posed: No osed: No ion in Area: 78.6% ion in Volume: 78.6% alization: Small (1-33%) ng: No ning: No Treatment Notes Wound #5 (Left, Medial Lower Leg) 1. Cleanse With Wound Cleanser Soap and water 3. Primary Dressing Applied Calcium Alginate Ag 4. Secondary Dressing ABD Pad Dry Gauze Roll Gauze 5. Secured With Medipore tape Notes applied primary and secondary dressing. Sent patient to ED per PA r/t infection to left lateral foot. Electronic Signature(s) Signed: 03/24/2019 3:39:50 PM By: Mikeal Hawthorne EMT/HBOT Signed: 03/24/2019 5:34:18 PM By: Deon Pilling Previous Signature: 03/23/2019 5:21:15 PM Version By: Deon Pilling Entered By: Mikeal Hawthorne on 03/24/2019 14:14:09 -------------------------------------------------------------------------------- Wound Assessment Details Patient Name: Date of Service: [name] 03/23/2019 10:45 AM Medical Record [id number] Patient Account Number: [id number] Date of Birth/Sex: Treating RN: [birth year] (73 y.o. Helene Shoe, Meta.Reding Primary Care Ja Ohman: [name] Other Clinician: Referring Soma Lizak: Treating Araeya Lamb/Extender:Stone III, Lemar Livings, Lendon Ka Weeks  in Treatment: 4 Wound Status Wound Number: 6 Primary Venous Leg Ulcer Etiology: Wound Location: Left Lower Leg - Medial, Proximal Wound Open Wounding Event: Blister Status: Date Acquired: 01/06/2019 Comorbid Cataracts, Congestive Heart Failure, Coronary Weeks Of Treatment: 4 History: Artery Disease, Hypertension, Peripheral Clustered Wound: No Arterial Disease, Peripheral Venous Disease, Type II Diabetes, End Stage Renal Disease, Osteoarthritis, Neuropathy, Received Radiation Photos Wound Measurements Length: (cm) 0.7 % Red Width: (cm) 0.4 % Red Depth: (cm) 0.1 Epith Area: (cm) 0.22 Tunn Volume: (cm) 0.022 Unde Wound Description Classification: Full Thickness Without Exposed Support Foul Structures Slou Wound Flat and Intact Margin: Exudate Medium Amount: Exudate Serosanguineous Type: Exudate red, brown Color: Wound Bed Granulation Amount: Small (1-33%) Granulation Quality: Red, Pink Fasc Necrotic Amount: Large (67-100%) Fat Necrotic Quality: Adherent Slough Tend Musc Join Bone Odor After Cleansing: No gh/Fibrino Yes Exposed Structure ia Exposed: No Layer (Subcutaneous Tissue) Exposed: Yes on Exposed: No le Exposed: No t Exposed: No Exposed: No uction in Area: 53.3% uction in Volume: 53.2% elialization: Medium (34-66%) eling: No rmining: No Treatment Notes Wound #6 (Left, Proximal, Medial Lower Leg) 1. Cleanse With Wound Cleanser Soap and water 3. Primary Dressing Applied Calcium Alginate Ag 4. Secondary Dressing ABD Pad Dry Gauze Roll Gauze 5. Secured With Medipore tape Notes applied primary and secondary dressing. Sent patient to ED per PA r/t infection to left lateral foot. Electronic Signature(s) Signed: 03/24/2019 3:39:50 PM By: Mikeal Hawthorne EMT/HBOT Signed: 03/24/2019 5:34:18 PM By: Deon Pilling Previous Signature: 03/23/2019 5:21:15 PM Version By: Deon Pilling Entered By: Mikeal Hawthorne on 03/24/2019  14:14:32 -------------------------------------------------------------------------------- Wound Assessment Details Patient Name: Date of Service: [name] 03/23/2019 10:45 AM Medical Record [id number] Patient Account Number: [id number] Date of Birth/Sex: Treating RN: 24-Dec-1945 (73 y.o. Debby Bud Primary Care Brynja Marker: [name] Other Clinician: Referring Karimah Winquist: Treating Rosealyn Little/Extender:Stone III, Lemar Livings, Lendon Ka Weeks in Treatment: 4 Wound Status Wound Number: 7 Primary Diabetic Wound/Ulcer of the Lower Extremity Etiology: Wound Location: Left Metatarsal head fifth - Lateral Wound Open Wounding Event: Gradually Appeared Status: Date Acquired: 03/09/2019 Comorbid Cataracts, Congestive Heart Failure, Coronary Weeks Of Treatment: 2 History: Artery Disease, Hypertension, Peripheral Clustered Wound: No Arterial Disease, Peripheral Venous Disease, Type II Diabetes, End Stage Renal Disease, Osteoarthritis, Neuropathy, Received Radiation Photos Wound Measurements Length: (cm) 1.7 Width: (cm)  2.3 Depth: (cm) 0.9 Area: (cm) 3.071 Volume: (cm) 2.764 % Reduction in Area: -3167% % Reduction in Volume: -14447.4% Epithelialization: None Tunneling: No Undermining: Yes Starting Position (o'clock): 6 Ending Position (o'clock): 12 Maximum Distance: (cm) 1.3 Wound Description Classification: Grade 2 Wound Margin: Well defined, not attached Exudate Amount: Medium Exudate Type: Purulent Exudate Color: yellow, brown, green Wound Bed Granulation Amount: Small (1-33%) Granulation Quality: Pink, Pale Necrotic Amount: Large (67-100%) Necrotic Quality: Adherent Slough Foul Odor After Cleansing: Yes Due to Product Use: No Slough/Fibrino Yes Exposed Structure Fascia Exposed: No Fat Layer (Subcutaneous Tissue) Exposed: Yes Tendon Exposed: No Muscle Exposed: No Joint Exposed: No Bone Exposed: Yes Assessment Notes redden, edema, and odor  noted to periwound and wound. Treatment Notes Wound #7 (Left, Lateral Metatarsal head fifth) 1. Cleanse With Wound Cleanser Soap and water 3. Primary Dressing Applied Calcium Alginate Ag 4. Secondary Dressing ABD Pad Dry Gauze Roll Gauze 5. Secured With Medipore tape Notes applied primary and secondary dressing. Sent patient to ED per PA r/t infection to left lateral foot. Electronic Signature(s) Signed: 03/24/2019 3:39:50 PM By: Mikeal Hawthorne EMT/HBOT Signed: 03/24/2019 5:34:18 PM By: Deon Pilling Previous Signature: 03/23/2019 5:21:15 PM Version By: Deon Pilling Entered By: Mikeal Hawthorne on 03/24/2019 14:15:02 -------------------------------------------------------------------------------- Vitals Details Patient Name: Date of Service: [name] 03/23/2019 10:45 AM Medical Record [id number] Patient Account Number: [id number] Date of Birth/Sex: Treating RN: 08/26/1945 (73 y.o. Helene Shoe, Meta.Reding Primary Care Lamiah Marmol: [name] Other Clinician: Referring Eulogia Dismore: Treating Wynona Duhamel/Extender:Stone III, Lemar Livings, Lendon Ka Weeks in Treatment: 4 Vital Signs Time Taken: 10:48 Temperature (F): 98.9 Height (in): 65 Pulse (bpm): 93 Weight (lbs): 162 Respiratory Rate (breaths/min): 20 Body Mass Index (BMI): 27 Blood Pressure (mmHg): 145/60 Capillary Blood Glucose (mg/dl): 115 Reference Range: 80 - 120 mg / dl Electronic Signature(s) Signed: 03/23/2019 5:21:15 PM By: Deon Pilling Entered By: Deon Pilling on 03/23/2019 11:10:00

## 2019-03-25 ENCOUNTER — Encounter (HOSPITAL_COMMUNITY)
Admission: EM | Disposition: A | Discharge status: Rehabilitation Facility | Payer: Self-pay | Source: Home / Self Care | Attending: Internal Medicine

## 2019-03-25 ENCOUNTER — Encounter (HOSPITAL_COMMUNITY): Payer: Medicare Other

## 2019-03-25 HISTORY — PX: ABDOMINAL AORTOGRAM W/LOWER EXTREMITY: CATH118223

## 2019-03-25 LAB — GLUCOSE, CAPILLARY
Glucose-Capillary: 159 mg/dL — ABNORMAL HIGH (ref 70–99)
Glucose-Capillary: 280 mg/dL — ABNORMAL HIGH (ref 70–99)
Glucose-Capillary: 211 mg/dL — ABNORMAL HIGH (ref 70–99)
Glucose-Capillary: 285 mg/dL — ABNORMAL HIGH (ref 70–99)

## 2019-03-25 LAB — RENAL FUNCTION PANEL
Albumin: 2.8 g/dL — ABNORMAL LOW (ref 3.5–5.0)
Anion gap: 18 — ABNORMAL HIGH (ref 5–15)
BUN: 71 mg/dL — ABNORMAL HIGH (ref 8–23)
CO2: 21 mmol/L — ABNORMAL LOW (ref 22–32)
Calcium: 8.2 mg/dL — ABNORMAL LOW (ref 8.9–10.3)
Chloride: 96 mmol/L — ABNORMAL LOW (ref 98–111)
Creatinine, Ser: 4.76 mg/dL — ABNORMAL HIGH (ref 0.44–1.00)
GFR calc Af Amer: 10 mL/min — ABNORMAL LOW (ref >60)
GFR calc non Af Amer: 8 mL/min — ABNORMAL LOW (ref >60)
Glucose, Bld: 172 mg/dL — ABNORMAL HIGH (ref 70–99)
Phosphorus: 5.3 mg/dL — ABNORMAL HIGH (ref 2.5–4.6)
Potassium: 5.6 mmol/L — ABNORMAL HIGH (ref 3.5–5.1)
Sodium: 135 mmol/L (ref 135–145)

## 2019-03-25 LAB — BASIC METABOLIC PANEL
Anion gap: 16 — ABNORMAL HIGH (ref 5–15)
BUN: 71 mg/dL — ABNORMAL HIGH (ref 8–23)
CO2: 23 mmol/L (ref 22–32)
Calcium: 7.9 mg/dL — ABNORMAL LOW (ref 8.9–10.3)
Chloride: 96 mmol/L — ABNORMAL LOW (ref 98–111)
Creatinine, Ser: 4.61 mg/dL — ABNORMAL HIGH (ref 0.44–1.00)
GFR calc Af Amer: 10 mL/min — ABNORMAL LOW (ref >60)
GFR calc non Af Amer: 9 mL/min — ABNORMAL LOW (ref >60)
Glucose, Bld: 188 mg/dL — ABNORMAL HIGH (ref 70–99)
Potassium: 4.7 mmol/L (ref 3.5–5.1)
Sodium: 135 mmol/L (ref 135–145)

## 2019-03-25 LAB — CBC
HCT: 36.3 % (ref 36.0–46.0)
Hemoglobin: 11.6 g/dL — ABNORMAL LOW (ref 12.0–15.0)
MCH: 30.1 pg (ref 26.0–34.0)
MCHC: 32 g/dL (ref 30.0–36.0)
MCV: 94 fL (ref 80.0–100.0)
Platelets: 252 10*3/uL (ref 150–400)
RBC: 3.86 MIL/uL — ABNORMAL LOW (ref 3.87–5.11)
RDW: 15.7 % — ABNORMAL HIGH (ref 11.5–15.5)
WBC: 8.2 10*3/uL (ref 4.0–10.5)
nRBC: 0 % (ref 0.0–0.2)

## 2019-03-25 SURGERY — ABDOMINAL AORTOGRAM W/LOWER EXTREMITY
Anesthesia: LOCAL | Laterality: Bilateral

## 2019-03-25 MED ORDER — MIDAZOLAM HCL 2 MG/2ML IJ SOLN
INTRAMUSCULAR | Status: DC | PRN
  Administered 2019-03-25: 1 mg via INTRAVENOUS

## 2019-03-25 MED ORDER — MIDAZOLAM HCL 2 MG/2ML IJ SOLN
INTRAMUSCULAR | Status: AC
  Filled 2019-03-25: qty 2

## 2019-03-25 MED ORDER — SODIUM CHLORIDE 0.9 % IV SOLN: 100.0000 mL | INTRAVENOUS | Status: DC | PRN

## 2019-03-25 MED ORDER — ALTEPLASE 2 MG IJ SOLR: 2.0000 mg | Freq: Once | INTRAMUSCULAR | Status: DC | PRN

## 2019-03-25 MED ORDER — IODIXANOL 320 MG/ML IV SOLN
INTRAVENOUS | Status: DC | PRN
  Administered 2019-03-25: 160 mL

## 2019-03-25 MED ORDER — HEPARIN SODIUM (PORCINE) 1000 UNIT/ML DIALYSIS
1000.0000 [IU] | INTRAMUSCULAR | Status: DC | PRN
  Filled 2019-03-25: qty 1

## 2019-03-25 MED ORDER — SODIUM CHLORIDE 0.9% FLUSH
3.0000 mL | Freq: Two times a day (BID) | INTRAVENOUS | Status: DC
  Administered 2019-03-25 – 2019-03-27 (×3): 3 mL via INTRAVENOUS

## 2019-03-25 MED ORDER — ACETAMINOPHEN 325 MG PO TABS: 650.0000 mg | ORAL_TABLET | ORAL | Status: DC | PRN

## 2019-03-25 MED ORDER — PENTAFLUOROPROP-TETRAFLUOROETH EX AERO: 1.0000 "application " | INHALATION_SPRAY | CUTANEOUS | Status: DC | PRN

## 2019-03-25 MED ORDER — SODIUM CHLORIDE 0.9% FLUSH: 3.0000 mL | INTRAVENOUS | Status: DC | PRN

## 2019-03-25 MED ORDER — ONDANSETRON HCL 4 MG/2ML IJ SOLN: 4.0000 mg | Freq: Four times a day (QID) | INTRAMUSCULAR | Status: DC | PRN

## 2019-03-25 MED ORDER — LIDOCAINE HCL (PF) 1 % IJ SOLN
INTRAMUSCULAR | Status: AC
  Filled 2019-03-25: qty 30

## 2019-03-25 MED ORDER — LIDOCAINE-PRILOCAINE 2.5-2.5 % EX CREA
1.0000 "application " | TOPICAL_CREAM | CUTANEOUS | Status: DC | PRN
  Filled 2019-03-25: qty 5

## 2019-03-25 MED ORDER — VANCOMYCIN HCL IN DEXTROSE 750-5 MG/150ML-% IV SOLN
INTRAVENOUS | Status: AC
  Administered 2019-03-25: 750 mg via INTRAVENOUS
  Filled 2019-03-25: qty 150

## 2019-03-25 MED ORDER — DIPHENHYDRAMINE HCL 50 MG/ML IJ SOLN
25.0000 mg | Freq: Once | INTRAMUSCULAR | Status: AC
  Administered 2019-03-25: 25 mg via INTRAVENOUS
  Filled 2019-03-25: qty 1

## 2019-03-25 MED ORDER — HEPARIN (PORCINE) IN NACL 1000-0.9 UT/500ML-% IV SOLN
INTRAVENOUS | Status: AC
  Filled 2019-03-25: qty 1000

## 2019-03-25 MED ORDER — LIDOCAINE HCL (PF) 1 % IJ SOLN
INTRAMUSCULAR | Status: DC | PRN
  Administered 2019-03-25: 15 mL

## 2019-03-25 MED ORDER — HYDRALAZINE HCL 20 MG/ML IJ SOLN: 5.0000 mg | INTRAMUSCULAR | Status: DC | PRN

## 2019-03-25 MED ORDER — METHYLPREDNISOLONE SODIUM SUCC 125 MG IJ SOLR
125.0000 mg | Freq: Once | INTRAMUSCULAR | Status: AC
  Administered 2019-03-25: 125 mg via INTRAVENOUS
  Filled 2019-03-25: qty 2

## 2019-03-25 MED ORDER — INSULIN ASPART 100 UNIT/ML ~~LOC~~ SOLN
0.0000 [IU] | Freq: Three times a day (TID) | SUBCUTANEOUS | Status: DC
  Administered 2019-03-26 – 2019-03-27 (×4): 5 [IU] via SUBCUTANEOUS
  Administered 2019-03-27 – 2019-03-29 (×3): 3 [IU] via SUBCUTANEOUS
  Administered 2019-03-29: 2 [IU] via SUBCUTANEOUS
  Administered 2019-03-30 (×2): 3 [IU] via SUBCUTANEOUS
  Administered 2019-03-31: 5 [IU] via SUBCUTANEOUS
  Administered 2019-04-01 – 2019-04-02 (×2): 2 [IU] via SUBCUTANEOUS
  Administered 2019-04-03 – 2019-04-04 (×2): 3 [IU] via SUBCUTANEOUS
  Administered 2019-04-04 – 2019-04-07 (×3): 2 [IU] via SUBCUTANEOUS
  Administered 2019-04-07: 3 [IU] via SUBCUTANEOUS

## 2019-03-25 MED ORDER — VANCOMYCIN HCL IN DEXTROSE 750-5 MG/150ML-% IV SOLN: 750.0000 mg | Freq: Once | INTRAVENOUS | Status: AC

## 2019-03-25 MED ORDER — LIDOCAINE HCL (PF) 1 % IJ SOLN: 5.0000 mL | INTRAMUSCULAR | Status: DC | PRN

## 2019-03-25 MED ORDER — SODIUM CHLORIDE 0.9 % IV SOLN: 250.0000 mL | INTRAVENOUS | Status: DC | PRN

## 2019-03-25 MED ORDER — HEPARIN (PORCINE) IN NACL 1000-0.9 UT/500ML-% IV SOLN
INTRAVENOUS | Status: DC | PRN
  Administered 2019-03-25: 500 mL

## 2019-03-25 MED ORDER — FENTANYL CITRATE (PF) 100 MCG/2ML IJ SOLN
INTRAMUSCULAR | Status: AC
  Filled 2019-03-25: qty 2

## 2019-03-25 MED ORDER — INSULIN ASPART 100 UNIT/ML ~~LOC~~ SOLN
0.0000 [IU] | Freq: Every day | SUBCUTANEOUS | Status: DC
  Administered 2019-03-25: 3 [IU] via SUBCUTANEOUS

## 2019-03-25 MED ORDER — HEPARIN SODIUM (PORCINE) 1000 UNIT/ML DIALYSIS
5000.0000 [IU] | INTRAMUSCULAR | Status: DC | PRN
  Filled 2019-03-25: qty 5

## 2019-03-25 MED ORDER — FENTANYL CITRATE (PF) 100 MCG/2ML IJ SOLN
INTRAMUSCULAR | Status: DC | PRN
  Administered 2019-03-25: 25 ug via INTRAVENOUS

## 2019-03-25 SURGICAL SUPPLY — 9 items
CATH OMNI FLUSH 5F 65CM (CATHETERS) ×1 IMPLANT
KIT MICROPUNCTURE NIT STIFF (SHEATH) ×1 IMPLANT
KIT PV (KITS) ×2 IMPLANT
SHEATH PINNACLE 5F 10CM (SHEATH) ×1 IMPLANT
SHEATH PROBE COVER 6X72 (BAG) ×1 IMPLANT
SYR MEDRAD MARK V 150ML (SYRINGE) ×1 IMPLANT
TRANSDUCER W/STOPCOCK (MISCELLANEOUS) ×2 IMPLANT
TRAY PV CATH (CUSTOM PROCEDURE TRAY) ×2 IMPLANT
WIRE BENTSON .035X145CM (WIRE) ×1 IMPLANT

## 2019-03-25 NOTE — Progress Notes (Signed)
[facility]  Subjective  - chronic back pain before the procedure.   Objective 108/72 97 97.9 F (36.6 C) (Oral) 12 99%  Intake/Output Summary (Last 24 hours) at 03/25/2019 0744 Last data filed at 03/25/2019 0303 Gross per 24 hour  Intake 540 ml  Output 0 ml  Net 540 ml    Palpable femoral pulses bilaterally No palpable pedal pulses Bilateral lower extremity tissue loss as previously documented  Laboratory Lab Results: Recent Labs    03/23/19 1355 03/25/19 0500  WBC 10.7* 8.2  HGB 10.8* 11.6*  HCT 33.1* 36.3  PLT 274 252   BMET Recent Labs    03/24/19 1407 03/25/19 0500  NA 135 135  K 4.5 4.7  CL 96* 96*  CO2 25 23  GLUCOSE 230* 188*  BUN 63* 71*  CREATININE 4.29* 4.61*  CALCIUM 8.1* 7.9*    COAG Lab Results  Component Value Date   INR 1.2 03/23/2019   INR 1.17 11/30/2017   INR 1.24 11/26/2017   No results found for: PTT  Assessment/Planning:  Plan aortogram, bilateral lower extremity arteriogram, possible intervention.  Will work on left leg first given progressive wound prompting hospital admission for IV antibiotics and underlying osteomyelitis.  Risks and benefits discussed.  Marty Heck 03/25/2019 7:44 AM --

## 2019-03-25 NOTE — Progress Notes (Addendum)
PROGRESS NOTE   [name]  ZDG:[id number]    DOB: 1945/06/30    DOA: 03/23/2019  PCP: Unk Pinto, MD   I have briefly reviewed patients previous medical records in [facility].  Chief Complaint:  Patient sent from wound care center due to infected left foot wound that failed outpatient antibiotics.  Brief Narrative:  73 year old married female, independent, PMH of CAD s/p CABG, chronic combined CHF/ICM, stage IV CKD (baseline creatinine 2.7-3.1), HTN, HLD, DM, lower extremity PAD (bilateral SFA occlusion 07/2018), chronic bilateral feet ulcerations, osteomyelitis of left toe, LBBB who has had a chronic wound on her left foot that is followed at the wound care center closely, recently noted worsening infection and started on IV antibiotics including vancomycin at dialysis (reportedly completed 3 doses) sent to [facility] from the wound care center due to low-grade fevers and increased drainage from the ulcer.  Admitted for left little toe osteomyelitis, abscess and sepsis complicating PAD.  Nephrology consulted for HD needs.  S/p aortogram with bilateral lower extremity arteriogram with runoff 12/18 with plans for left common femoral endarterectomy with left femoropopliteal bypass on 12/21.  Orthopedics/Dr. Sharol Given planned surgical intervention after vascular procedure.   Assessment & Plan:  Active Problems:   PVD (peripheral vascular disease) ([place])   ESRD on hemodialysis ([place])   Pyogenic inflammation of bone (HCC)   Osteomyelitis of foot, left, acute (HCC)   Septic arthritis of left foot (HCC)   Cellulitis of left foot   Abscess of right foot   Left little toe infected chronic ulcer with abscess and osteomyelitis, complicating PAD and peripheral neuropathy.  Vascular surgery and orthopedics consultation appreciated.  Dr. Carlis Abbott, VVS S/p aortogram with bilateral lower extremity arteriogram with runoff 12/18 with plans for left common femoral endarterectomy with left  femoro to below knee popliteal bypass on 12/21.    Dr. Sharol Given, Orthopedics will consider surgical intervention pending vascular intervention status.  Failed outpatient antibiotic treatment.  Continue empirically started IV vancomycin and Zosyn.  Wound culture OP showed Serratia.  Blood cultures x2 from 12/16: Negative to date.  MRSA PCR negative.  Chronic right foot, bilateral leg ulcers  Patient has bilateral leg ulcers, suspected arterial ulcers from PAD.  Fairly clean.  She also has chronic right foot mid plantar ulcer with some slough but no overt drainage.  Orthopedics may consider intervention pending vascular surgery clearance.  PAD  Management as per vascular surgery as noted above.  Type II DM with peripheral neuropathy  Recent A1c 6.3.  Reports occasional hypoglycemic symptoms on home regimen of 70/30 insulin.  Currently only on SSI with widely fluctuating CBGs as noted below.  Adjust SSI and monitor closely.  Will consider maintenance long-acting insulin/Levemir if persistently high CBGs.  Patient reports that she takes 70/30 at home on a sliding scale regimen.  Usually takes 10-20 units in the morning and maybe 10 units at night.  CBGs mildly uncontrolled.  Continue SSI for now.  ESRD on TTS HD  Nephrology following.  Off scheduled dialysis today.  Hyperkalemia 5.6 was predialysis today.  Discussed with Dr. Marval Regal.  Essential hypertension  Controlled.  Continue hydralazine 25 mg 3 times daily and isosorbide dinitrate 10 mg 3 times daily.  Chronic systolic CHF  Compensated.  Gout  No acute flare.  Continue allopurinol.  Anemia of chronic kidney disease  Stable.   DVT prophylaxis: Subcutaneous heparin Code Status: Full Family Communication: None at bedside. I discussed with the patient's son, updated care and  answered questions. Disposition: To be determined pending interventions and clinical improvement, not until sometime next  week.   Consultants:   Nephrology Vascular surgery Orthopedics  Procedures:   As above  Antimicrobials:   IV vancomycin and Zosyn 12/16 >   Subjective:  Patient seen after procedure.  Denies pain or complaints.  Indicates that she is supposed to have bypass procedure on her lower extremity on Monday.  Objective:   Vitals:   03/25/19 0900 03/25/19 0904 03/25/19 1100 03/25/19 1340  BP:  135/80 (!) 151/70   Pulse: (!) 0 87    Resp: (!) 0 16 20   Temp:  97.6 F (36.4 C) 98 F (36.7 C) 98 F (36.7 C)  TempSrc:  Oral Oral Oral  SpO2: (!) 0% 99%    Weight:  75.6 kg    Height:  5\' 4"  (1.626 m)      General exam: Pleasant elderly female, moderately built and nourished sitting up comfortably in bed without distress. Respiratory system: Clear to auscultation.  No increased work of breathing. Cardiovascular system: S1 & S2 heard, RRR. No JVD, murmurs, rubs, gallops or clicks. No pedal edema.  Telemetry personally reviewed: Sinus rhythm. Gastrointestinal system: Abdomen is nondistended, soft and nontender. No organomegaly or masses felt. Normal bowel sounds heard. Central nervous system: Alert and oriented. No focal neurological deficits. Extremities: Symmetric 5 x 5 power. Skin: She has what appears to be an arterial ulcer on medial aspect of both calves with some slough but no obvious drainage or cellulitis features.  She has approximately 1 cm diameter right plantar midfoot ulcer without drainage, has surrounding callus formation.  She has a ulcer on the lateral aspect of the left little toe with purulent drainage and surrounding patchy mild erythema of dorsum of foot.  Unable to palpate bilateral dorsalis pedis or posterior tibials.  Left foot dressing clean and dry. Psychiatry: Judgement and insight appear normal. Mood & affect appropriate.     Data Reviewed:   I have personally reviewed following labs and imaging studies   CBC: Recent Labs  Lab 03/23/19 1355  03/25/19 0500  WBC 10.7* 8.2  NEUTROABS 8.0*  --   HGB 10.8* 11.6*  HCT 33.1* 36.3  MCV 94.6 94.0  PLT 274 865    Basic Metabolic Panel: Recent Labs  Lab 03/24/19 1407 03/25/19 0500 03/25/19 1000  NA 135 135 135  K 4.5 4.7 5.6*  CL 96* 96* 96*  CO2 25 23 21*  GLUCOSE 230* 188* 172*  BUN 63* 71* 71*  CREATININE 4.29* 4.61* 4.76*  CALCIUM 8.1* 7.9* 8.2*  PHOS 4.5  --  5.3*    Liver Function Tests: Recent Labs  Lab 03/23/19 1355 03/24/19 1407 03/25/19 1000  AST 22  --   --   ALT 23  --   --   ALKPHOS 192*  --   --   BILITOT 0.5  --   --   PROT 5.9*  --   --   ALBUMIN 2.8* 2.7* 2.8*    CBG: Recent Labs  Lab 03/24/19 2032 03/25/19 0656 03/25/19 1140  GLUCAP 175* 159* 280*    Microbiology Studies:   Recent Results (from the past 240 hour(s))  Aerobic Culture (superficial specimen)     Status: None   Collection Time: 03/16/19 11:30 AM   Specimen: Wound  Result Value Ref Range Status   Specimen Description   Final    WOUND LEFT LATERAL FOOT Performed at [facility], [place]  [address], [place], [place] [address]    Special Requests   Final    NONE Performed at [facility], [place] [address], [place], [place] [address]    Gram Stain   Final    NO WBC SEEN NO ORGANISMS SEEN Performed at [facility], [place] [address], [place], [place] [address]    Culture FEW SERRATIA MARCESCENS  Final   Report Status 03/18/2019 FINAL  Final   Organism ID, Bacteria SERRATIA MARCESCENS  Final      Susceptibility   Serratia marcescens - MIC*    CEFAZOLIN >=64 RESISTANT Resistant     CEFEPIME <=1 SENSITIVE Sensitive     CEFTAZIDIME <=1 SENSITIVE Sensitive     CEFTRIAXONE <=1 SENSITIVE Sensitive     CIPROFLOXACIN <=0.25 SENSITIVE Sensitive     GENTAMICIN <=1 SENSITIVE Sensitive     TRIMETH/SULFA <=20 SENSITIVE Sensitive     * FEW SERRATIA MARCESCENS  Culture, blood (Routine x 2)     Status: None (Preliminary result)    Collection Time: 03/23/19  1:38 PM   Specimen: BLOOD  Result Value Ref Range Status   Specimen Description BLOOD LEFT ANTECUBITAL  Final   Special Requests   Final    BOTTLES DRAWN AEROBIC AND ANAEROBIC Blood Culture results may not be optimal due to an inadequate volume of blood received in culture bottles   Culture   Final    NO GROWTH 2 DAYS Performed at [facility], [place] [address], [place], [place] [address]    Report Status PENDING  Incomplete  Culture, blood (Routine x 2)     Status: None (Preliminary result)   Collection Time: 03/23/19  3:38 PM   Specimen: BLOOD  Result Value Ref Range Status   Specimen Description BLOOD BLOOD LEFT FOREARM  Final   Special Requests   Final    BOTTLES DRAWN AEROBIC AND ANAEROBIC Blood Culture results may not be optimal due to an excessive volume of blood received in culture bottles   Culture   Final    NO GROWTH 2 DAYS Performed at [facility], [place] [address], [place], [place] [address]    Report Status PENDING  Incomplete  SARS CORONAVIRUS 2 (TAT 6-24 HRS) Nasopharyngeal Nasopharyngeal Swab     Status: None   Collection Time: 03/23/19  3:57 PM   Specimen: Nasopharyngeal Swab  Result Value Ref Range Status   SARS Coronavirus 2 NEGATIVE NEGATIVE Final    Comment: (NOTE) SARS-CoV-2 target nucleic acids are NOT DETECTED. The SARS-CoV-2 RNA is generally detectable in upper and lower respiratory specimens during the acute phase of infection. Negative results do not preclude SARS-CoV-2 infection, do not rule out co-infections with other pathogens, and should not be used as the sole basis for treatment or other patient management decisions. Negative results must be combined with clinical observations, patient history, and epidemiological information. The expected result is Negative. Fact Sheet for Patients: SugarRoll.be Fact Sheet for Healthcare  Providers: https://www.woods-mathews.com/ This test is not yet approved or cleared by the [place] FDA and  has been authorized for detection and/or diagnosis of SARS-CoV-2 by FDA under an Emergency Use Authorization (EUA). This EUA will remain  in effect (meaning this test can be used) for the duration of the COVID-19 declaration under Section 56 4(b)(1) of the Act, 21 U.S.C. section 360bbb-3(b)(1), unless the authorization is terminated or revoked sooner. Performed at [facility], [place] [address] [place], [place] [address]   MRSA  PCR Screening     Status: None   Collection Time: 03/24/19  6:10 AM   Specimen: Nasal Mucosa; Nasopharyngeal  Result Value Ref Range Status   MRSA by PCR NEGATIVE NEGATIVE Final    Comment:        The GeneXpert MRSA Assay (FDA approved for NASAL specimens only), is one component of a comprehensive MRSA colonization surveillance program. It is not intended to diagnose MRSA infection nor to guide or monitor treatment for MRSA infections. Performed at [facility], [place] [address] [place], [place] [address]      Radiology Studies:  PERIPHERAL VASCULAR CATHETERIZATION  Result Date: 03/25/2019 Patient name: [name]         MRN: [id number]        DOB: 02-06-1946          Sex: female  03/25/2019 Pre-operative Diagnosis: Critical limb ischemia of the bilateral lower extremities with bilateral lower extremity tissue loss.  Current hospital admission prompted by left fifth metatarsal osteomyelitis and open left foot wound. Post-operative diagnosis:  Same Surgeon:  Marty Heck, MD Procedure Performed: 1.  Ultrasound-guided access of the right common femoral artery 2.  Aortogram 3.  Bilateral lower extremity arteriogram with runoff 4.  32 minutes of monitored moderate conscious sedation time  Indications: Patient is a 73 year old female with multiple medical comorbidities well-known to the vascular surgery service that  was admitted with open wound and left fifth metatarsal osteomyelitis after failure of outpatient antibiotics.  Ultimately she has known bilateral SFA occlusions with monophasic runoff at the ankle from vascular studies earlier this year.  She was previously evaluated for right lower extremity wound by Dr. Donnetta Hutching that was healing at the time of initial evaluation in April of this year.  Her right foot wound persists at this time.  She now presents for aortogram, bilateral lower extremity arteriogram, possible intervention after risk and benefits were discussed.  Findings:  Aortogram showed calcified but patent aortoiliac segment bilaterally with no overt flow-limiting stenosis.  Left lower extremity arteriogram shows at least 70% stenosis in the left common femoral artery with heavily calcified plaque.  Patient has a flush SFA occlusion on the left with profunda runoff.  Her SFA is occluded throughout its proximal mid and distal segment.  She does reconstitute a heavily diseased above-knee popliteal artery.  The below-knee popliteal artery looks much healthier although heavily calcified.  Patient's dominant runoff is via the anterior tibial but does appear to have at least three-vessel runoff.  There is significant small vessel disease in the left foot.  Right lower extremity arteriogram shows again at least 50% common femoral artery stenosis with dominant runoff via the profunda.  There is a very tiny SFA stump with large calcified plaque but then it occludes shortly after takeoff.  Again the SFA is occluded throughout its entire segment.  She does reconstitute heavily diseased below-knee popliteal artery with near occlusive plaque and single-vessel runoff via an anterior tibial artery.             Procedure:  The patient was identified in the holding area and taken to room 8.  The patient was then placed supine on the table and prepped and draped in the usual sterile fashion.  A time out was called.  Ultrasound  was used to evaluate the right common femoral artery.  It was patent .  A digital ultrasound image was acquired.  A micropuncture needle was used to access the right common femoral  artery under ultrasound guidance.  An 018 wire was advanced without resistance and a micropuncture sheath was placed.  The 018 wire was removed and a benson wire was placed.  The micropuncture sheath was exchanged for a 5 french sheath.  An omniflush catheter was advanced over the wire to the level of L-1.  An abdominal angiogram was obtained.  Next, the Omni Flush catheter was pulled down in the aorta and bilateral lower extremity runoff was obtained.  Pertinent findings are noted above.  Given the patient had a sheath in the right common femoral artery that was fairly diseased we had fairly limited evaluation of the right lower extremity.  As a result we then removed the Omni Flush catheter over a wire.  We then connected our contrast tubing to the sheath in the right common femoral artery and retrograde runoff was obtained of the right lower extremity in staged images to get better views.  All pertinent findings are noted above.  Once imaging was complete, the sheath in the right common femoral artery was then pulled and manual pressure was held for 20 minutes.  She tolerated the procedure without any apparent complications.  Plan: Patient will be scheduled for left common femoral endarterectomy with left femoral to below-knee popliteal bypass on Monday with Dr. Donnetta Hutching.  Vein mapping has been ordered.  Marty Heck, MD [facility] [place] Office: [phone] Pager: [phone]  Marty Heck    Scheduled Meds:   . allopurinol  100 mg Oral BID  . aspirin EC  81 mg Oral Daily  . Chlorhexidine Gluconate Cloth  6 each Topical Q0600  . cinacalcet  30 mg Oral Q supper  . darbepoetin (ARANESP) injection - DIALYSIS  60 mcg Intravenous Q Thu-HD  . doxercalciferol  4 mcg Intravenous Q  T,Th,Sa-HD  . heparin  5,000 Units Subcutaneous Q12H  . hydrALAZINE  25 mg Oral Q8H  . insulin aspart  0-9 Units Subcutaneous TID WC  . isosorbide dinitrate  10 mg Oral TID  . sevelamer carbonate  1,600 mg Oral TID WC  . sodium chloride flush  3 mL Intravenous Q12H  . traZODone  25-100 mg Oral QHS  . vancomycin variable dose per unstable renal function (pharmacist dosing)   Does not apply See admin instructions    Continuous Infusions:   . sodium chloride    . sodium chloride    . sodium chloride    . piperacillin-tazobactam (ZOSYN)  IV 2.25 g (03/25/19 0330)     LOS: 2 days     Vernell Leep, MD, [place], [facility]. [facility]    To contact the attending provider between 7A-7P or the covering provider during after hours 7P-7A, please log into the web site [email or web address] and access using universal [place] password for that web site. If you do not have the password, please call the hospital operator.  03/25/2019, 5:11 PM

## 2019-03-25 NOTE — Op Note (Signed)
Patient name: [name] MRN: [id number] DOB: 03-Aug-1945 Sex: female  03/25/2019 Pre-operative Diagnosis: Critical limb ischemia of the bilateral lower extremities with bilateral lower extremity tissue loss.  Current hospital admission prompted by left fifth metatarsal osteomyelitis and open left foot wound. Post-operative diagnosis:  Same Surgeon:  Marty Heck, MD Procedure Performed: 1.  Ultrasound-guided access of the right common femoral artery 2.  Aortogram 3.  Bilateral lower extremity arteriogram with runoff 4.  32 minutes of monitored moderate conscious sedation time  Indications: Patient is a 73 year old female with multiple medical comorbidities well-known to the vascular surgery service that was admitted with open wound and left fifth metatarsal osteomyelitis after failure of outpatient antibiotics.  Ultimately she has known bilateral SFA occlusions with monophasic runoff at the ankle from vascular studies earlier this year.  She was previously evaluated for right lower extremity wound by Dr. Donnetta Hutching that was healing at the time of initial evaluation in April of this year.  Her right foot wound persists at this time.  She now presents for aortogram, bilateral lower extremity arteriogram, possible intervention after risk and benefits were discussed.  Findings:   Aortogram showed calcified but patent aortoiliac segment bilaterally with no overt flow-limiting stenosis.  Left lower extremity arteriogram shows at least 70% stenosis in the left common femoral artery with heavily calcified plaque.  Patient has a flush SFA occlusion on the left with profunda runoff.  Her SFA is occluded throughout its proximal mid and distal segment.  She does reconstitute a heavily diseased above-knee popliteal artery.  The below-knee popliteal artery looks much healthier although heavily calcified.  Patient's dominant runoff is via the anterior tibial but does appear to have at least  three-vessel runoff.  There is significant small vessel disease in the left foot.  Right lower extremity arteriogram shows again at least 50% common femoral artery stenosis with dominant runoff via the profunda.  There is a very tiny SFA stump with large calcified plaque but then it occludes shortly after takeoff.  Again the SFA is occluded throughout its entire segment.  She does reconstitute heavily diseased below-knee popliteal artery with near occlusive plaque and single-vessel runoff via an anterior tibial artery.   Procedure:  The patient was identified in the holding area and taken to room 8.  The patient was then placed supine on the table and prepped and draped in the usual sterile fashion.  A time out was called.  Ultrasound was used to evaluate the right common femoral artery.  It was patent .  A digital ultrasound image was acquired.  A micropuncture needle was used to access the right common femoral artery under ultrasound guidance.  An 018 wire was advanced without resistance and a micropuncture sheath was placed.  The 018 wire was removed and a benson wire was placed.  The micropuncture sheath was exchanged for a 5 french sheath.  An omniflush catheter was advanced over the wire to the level of L-1.  An abdominal angiogram was obtained.  Next, the Omni Flush catheter was pulled down in the aorta and bilateral lower extremity runoff was obtained.  Pertinent findings are noted above.  Given the patient had a sheath in the right common femoral artery that was fairly diseased we had fairly limited evaluation of the right lower extremity.  As a result we then removed the Omni Flush catheter over a wire.  We then connected our contrast tubing to the sheath in the right common femoral artery and  retrograde runoff was obtained of the right lower extremity in staged images to get better views.  All pertinent findings are noted above.  Once imaging was complete, the sheath in the right common femoral artery  was then pulled and manual pressure was held for 20 minutes.  She tolerated the procedure without any apparent complications.  Plan: Patient will be scheduled for left common femoral endarterectomy with left femoral to below-knee popliteal bypass on Monday with Dr. Donnetta Hutching.  Vein mapping has been ordered.  Marty Heck, MD [facility] [place] Office: [phone] Pager: [place]

## 2019-03-25 NOTE — Progress Notes (Signed)
Inpatient Diabetes Program Recommendations  AACE/ADA: New Consensus Statement on Inpatient Glycemic Control   Target Ranges:  Prepandial:   less than 140 mg/dL      Peak postprandial:   less than 180 mg/dL (1-2 hours)      Critically ill patients:  140 - 180 mg/dL   Results for SHANENA, PELLEGRINO (MRN [id number]) as of 03/25/2019 12:47  Ref. Range 03/24/2019 06:54 03/24/2019 11:20 03/24/2019 16:23 03/24/2019 20:32 03/25/2019 06:56 03/25/2019 11:40  Glucose-Capillary Latest Ref Range: 70 - 99 mg/dL 83 346 (H) 229 (H) 175 (H) 159 (H)   280 (H)   Review of Glycemic Control  Diabetes history: DM2 Outpatient Diabetes medications: 70/30 10-30 units BID Current orders for Inpatient glycemic control: Novolog 0-9 units TID with meals  Inpatient Diabetes Program Recommendations:   Insulin-Meal Coverage: Please consider ordering Novolog 3 units TID with meals for meal coverage if patient eats at least 50% of meals.  NOTE: Noted patient received Solumedrol 125 mg x1 today at 6:44 am which is contributing to hyperglycemia. Also note, consistently elevated postprandial glucose on 12/17.   Thanks, Barnie Alderman, RN, MSN, CDE Diabetes Coordinator Inpatient Diabetes Program [phone] (Team Pager from 8am to 5pm)

## 2019-03-25 NOTE — Procedures (Signed)
I was present at this dialysis session. I have reviewed the session itself and made appropriate changes.   Vital signs in last 24 hours:  Temp:  [97.6 F (36.4 C)-98.3 F (36.8 C)] 98 F (36.7 C) (12/18 1100) Pulse Rate:  [0-126] 87 (12/18 0904) Resp:  [0-28] 20 (12/18 1100) BP: (108-151)/(52-82) 151/70 (12/18 1100) SpO2:  [0 %-100 %] 99 % (12/18 0904) Weight:  [75.6 kg-75.7 kg] 75.6 kg (12/18 0904) Weight change: 2.22 kg Filed Weights   03/23/19 2123 03/24/19 2049 03/25/19 0904  Weight: 73.5 kg 75.7 kg 75.6 kg    Recent Labs  Lab 03/25/19 1000  NA 135  K 5.6*  CL 96*  CO2 21*  GLUCOSE 172*  BUN 71*  CREATININE 4.76*  CALCIUM 8.2*  PHOS 5.3*    Recent Labs  Lab 03/23/19 1355 03/25/19 0500  WBC 10.7* 8.2  NEUTROABS 8.0*  --   HGB 10.8* 11.6*  HCT 33.1* 36.3  MCV 94.6 94.0  PLT 274 252    Scheduled Meds: . allopurinol  100 mg Oral BID  . aspirin EC  81 mg Oral Daily  . Chlorhexidine Gluconate Cloth  6 each Topical Q0600  . cinacalcet  30 mg Oral Q supper  . darbepoetin (ARANESP) injection - DIALYSIS  60 mcg Intravenous Q Thu-HD  . doxercalciferol  4 mcg Intravenous Q T,Th,Sa-HD  . heparin  5,000 Units Subcutaneous Q12H  . hydrALAZINE  25 mg Oral Q8H  . insulin aspart  0-9 Units Subcutaneous TID WC  . isosorbide dinitrate  10 mg Oral TID  . sevelamer carbonate  1,600 mg Oral TID WC  . sodium chloride flush  3 mL Intravenous Q12H  . traZODone  25-100 mg Oral QHS  . vancomycin variable dose per unstable renal function (pharmacist dosing)   Does not apply See admin instructions   Continuous Infusions: . Vancomycin    . sodium chloride    . sodium chloride    . sodium chloride    . piperacillin-tazobactam (ZOSYN)  IV 2.25 g (03/25/19 0330)  . vancomycin     PRN Meds:.sodium chloride, sodium chloride, sodium chloride, acetaminophen, alteplase, heparin, heparin, hydrALAZINE, hydrALAZINE, HYDROcodone-acetaminophen, lidocaine (PF), lidocaine-prilocaine,  ondansetron (ZOFRAN) IV, pentafluoroprop-tetrafluoroeth, sevelamer carbonate, sodium chloride flush    Dialysis prescription: [facility], TTS, EDW 72 kg, BFR 350, DFR 800, Optiflux 180, 3K/2.25 calcium, vancomycin 750 mg q. HD, Mircera 50 mcg every 2 weeks, Hectorol 4 mcg IV 3 times weekly, heparin 5000 unit bolus. Right radiocephalic fistula.  Assessment and plan: 1. 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 Carlis Abbott today with plans for left femoral to below-knee popliteal bypass on Monday with Dr. Donnetta Hutching.  2. ESRD- off schedule.  HD today and will follow for possible treatment again on Sat to get back on schedule if list permits. 3. 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 HTN- stable 5. 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

## 2019-03-25 NOTE — Progress Notes (Signed)
Pharmacy Antibiotic Note  [name] is a 73 y.o. female admitted on 03/23/2019 with foot wound with imaging showing septic arthritis with osteomyelitis, surrounding inflammatory phlegmon and abscess, diffuse cellulitis and myofascitis.   Continues on Vancomycin (on prior to admission) / Adding Zosyn (one time dose given) Vancomycin random level this PM pre HD = 33 (high); estimated post-HD level 12/17 around 18  Wound culture from 12/9 at [facility] grew few serratia marcescens.  Plan: Getting HD today, will redose vancomycin 750 mg x 1 after session. Monitor clinical progress, c/s, abx plan/LOT F/u HD schedule/tolerance inpatient to enter vancomycin maintenance doses  Zosyn 2.25 grams iv Q 8 hours   Height: 5\' 4"  (162.6 cm) Weight: 166 lb 10.7 oz (75.6 kg) IBW/kg (Calculated) : 54.7  Temp (24hrs), Avg:97.9 F (36.6 C), Min:97.6 F (36.4 C), Max:98.3 F (36.8 C)  Recent Labs  Lab 03/23/19 1338 03/23/19 1355 03/23/19 1655 03/24/19 1407 03/25/19 0500 03/25/19 1000  WBC  --  10.7*  --   --  8.2  --   CREATININE  --  3.40*  --  4.29* 4.61* 4.76*  LATICACIDVEN 1.1  --  1.0  --   --   --   VANCORANDOM  --   --   --  33  --   --     Estimated Creatinine Clearance: 10.5 mL/min (A) (by C-G formula based on SCr of 4.76 mg/dL (H)).    Allergies  Allergen Reactions  . Atenolol Other (See Comments) and Rash    Exacerbates gout Exacerbates gout  . Adhesive [Tape] Other (See Comments)    SKIN IS VERY SENSITIVE AND BRUISES AND TEARS EASILY; PLEASE USE AN ALTERNATIVE TO TAPE!!  . Contrast Media [Iodinated Diagnostic Agents] Rash  . Iohexol Rash and Other (See Comments)  . Latex Rash    Anette Guarneri, PharmD Please check AMION for all [place] contact numbers Clinical Pharmacist 03/25/2019 3:14 PM

## 2019-03-25 NOTE — Progress Notes (Addendum)
Paged Bodenheimer N.P. at 22:20 and 22:30 patient blood sugar 285.  Call return 22:51 see orders. Charge R.N. aware and Mindy R.N. with rapid response is aware.

## 2019-03-26 ENCOUNTER — Inpatient Hospital Stay (HOSPITAL_COMMUNITY): Payer: Medicare Other

## 2019-03-26 DIAGNOSIS — Z01810 Encounter for preprocedural cardiovascular examination: Secondary | ICD-10-CM

## 2019-03-26 LAB — RENAL FUNCTION PANEL
Albumin: 2.5 g/dL — ABNORMAL LOW (ref 3.5–5.0)
Anion gap: 13 (ref 5–15)
BUN: 48 mg/dL — ABNORMAL HIGH (ref 8–23)
CO2: 25 mmol/L (ref 22–32)
Calcium: 7.7 mg/dL — ABNORMAL LOW (ref 8.9–10.3)
Chloride: 95 mmol/L — ABNORMAL LOW (ref 98–111)
Creatinine, Ser: 3.52 mg/dL — ABNORMAL HIGH (ref 0.44–1.00)
GFR calc Af Amer: 14 mL/min — ABNORMAL LOW (ref >60)
GFR calc non Af Amer: 12 mL/min — ABNORMAL LOW (ref >60)
Glucose, Bld: 151 mg/dL — ABNORMAL HIGH (ref 70–99)
Phosphorus: 4.3 mg/dL (ref 2.5–4.6)
Potassium: 3.9 mmol/L (ref 3.5–5.1)
Sodium: 133 mmol/L — ABNORMAL LOW (ref 135–145)

## 2019-03-26 LAB — GLUCOSE, CAPILLARY
Glucose-Capillary: 247 mg/dL — ABNORMAL HIGH (ref 70–99)
Glucose-Capillary: 231 mg/dL — ABNORMAL HIGH (ref 70–99)
Glucose-Capillary: 240 mg/dL — ABNORMAL HIGH (ref 70–99)
Glucose-Capillary: 212 mg/dL — ABNORMAL HIGH (ref 70–99)
Glucose-Capillary: 104 mg/dL — ABNORMAL HIGH (ref 70–99)

## 2019-03-26 LAB — CBC
HCT: 31 % — ABNORMAL LOW (ref 36.0–46.0)
Hemoglobin: 10.3 g/dL — ABNORMAL LOW (ref 12.0–15.0)
MCH: 31.1 pg (ref 26.0–34.0)
MCHC: 33.2 g/dL (ref 30.0–36.0)
MCV: 93.7 fL (ref 80.0–100.0)
Platelets: 266 10*3/uL (ref 150–400)
RBC: 3.31 MIL/uL — ABNORMAL LOW (ref 3.87–5.11)
RDW: 15.8 % — ABNORMAL HIGH (ref 11.5–15.5)
WBC: 10.8 10*3/uL — ABNORMAL HIGH (ref 4.0–10.5)
nRBC: 0.2 % (ref 0.0–0.2)

## 2019-03-26 LAB — VANCOMYCIN, RANDOM: Vancomycin Rm: 26

## 2019-03-26 MED ORDER — DOXERCALCIFEROL 4 MCG/2ML IV SOLN
INTRAVENOUS | Status: AC
  Filled 2019-03-26: qty 2

## 2019-03-26 MED ORDER — HYDROCODONE-ACETAMINOPHEN 5-325 MG PO TABS
ORAL_TABLET | ORAL | Status: AC
  Filled 2019-03-26: qty 1

## 2019-03-26 MED ORDER — CHLORHEXIDINE GLUCONATE CLOTH 2 % EX PADS
6.0000 | MEDICATED_PAD | Freq: Every day | CUTANEOUS | Status: DC
  Administered 2019-03-27 – 2019-03-29 (×2): 6 via TOPICAL

## 2019-03-26 MED ORDER — VANCOMYCIN HCL IN DEXTROSE 750-5 MG/150ML-% IV SOLN
750.0000 mg | INTRAVENOUS | Status: DC
  Filled 2019-03-26 (×2): qty 150

## 2019-03-26 MED ORDER — ACETAMINOPHEN 325 MG PO TABS: 650.0000 mg | ORAL_TABLET | ORAL | Status: DC | PRN

## 2019-03-26 MED ORDER — INSULIN DETEMIR 100 UNIT/ML ~~LOC~~ SOLN
10.0000 [IU] | Freq: Every day | SUBCUTANEOUS | Status: DC
  Administered 2019-03-26 – 2019-04-06 (×11): 10 [IU] via SUBCUTANEOUS
  Filled 2019-03-26 (×16): qty 0.1

## 2019-03-26 MED ORDER — VANCOMYCIN HCL 500 MG/100ML IV SOLN
500.0000 mg | Freq: Once | INTRAVENOUS | Status: AC
  Administered 2019-03-27: 500 mg via INTRAVENOUS
  Filled 2019-03-26 (×2): qty 100

## 2019-03-26 NOTE — Progress Notes (Signed)
PROGRESS NOTE   [name]  PXT:[id number]    DOB: [birth year]    DOA: 03/23/2019  PCP: Unk Pinto, MD   I have briefly reviewed patients previous medical records in [facility].  Chief Complaint:  Patient sent from wound care center due to infected left foot wound that failed outpatient antibiotics.  Brief Narrative:  73 year old married female, independent, PMH of CAD s/p CABG, chronic combined CHF/ICM, stage IV CKD (baseline creatinine 2.7-3.1), HTN, HLD, DM, lower extremity PAD (bilateral SFA occlusion 07/2018), chronic bilateral feet ulcerations, osteomyelitis of left toe, LBBB who has had a chronic wound on her left foot that is followed at the wound care center closely, recently noted worsening infection and started on IV antibiotics including vancomycin at dialysis (reportedly completed 3 doses) sent to [facility] from the wound care center due to low-grade fevers and increased drainage from the ulcer.  Admitted for left little toe osteomyelitis, abscess and sepsis complicating PAD.  Nephrology consulted for HD needs.  S/p aortogram with bilateral lower extremity arteriogram with runoff 12/18 with plans for left common femoral endarterectomy with left femoropopliteal bypass on 12/21.  Orthopedics/Dr. Sharol Given planned surgical intervention after vascular procedure.   Assessment & Plan:  Active Problems:   PVD (peripheral vascular disease) ([place])   ESRD on hemodialysis ([place])   Pyogenic inflammation of bone (HCC)   Osteomyelitis of foot, left, acute (HCC)   Septic arthritis of left foot (HCC)   Cellulitis of left foot   Abscess of right foot   Left little toe infected chronic ulcer with abscess and osteomyelitis, complicating PAD and peripheral neuropathy.  Vascular surgery and orthopedics consultation appreciated.  Dr. Carlis Abbott, VVS S/p aortogram with bilateral lower extremity arteriogram with runoff 12/18 with plans for left common femoral endarterectomy with left  femoro to below knee popliteal bypass on 12/21.    Dr. Sharol Given, Orthopedics will consider surgical intervention pending vascular intervention status.  Failed outpatient antibiotic treatment.  Continue empirically started IV vancomycin and Zosyn.  Wound culture OP showed Serratia.  Blood cultures x2 from 12/16: Negative to date.  MRSA PCR negative.  Plans as noted above.  Chronic right foot, bilateral leg ulcers  Patient has bilateral leg ulcers, suspected arterial ulcers from PAD.  Fairly clean.  She also has chronic right foot mid plantar ulcer with some slough but no overt drainage.  Orthopedics may consider intervention pending vascular surgery clearance.  PAD  Management as per vascular surgery as noted above.  Type II DM with peripheral neuropathy  Recent A1c 6.3.  Reports occasional hypoglycemic symptoms on home regimen of 70/30 insulin.  Currently only on SSI with widely fluctuating CBGs as noted below.  Adjust SSI and monitor closely.  Will consider maintenance long-acting insulin/Levemir if persistently high CBGs.  Patient reports that she takes 70/30 at home on a sliding scale regimen.  Usually takes 10-20 units in the morning and maybe 10 units at night.  CBGs uncontrolled and in the 200s now.  Initiated Levemir 10 units daily.  Adjust insulins as needed.  ESRD on TTS HD  Nephrology following.  Off scheduled dialysis 11/18.  Hyperkalemia 5.6 was predialysis today.    As per nephrology, short session HD dialysis 11/19 to get back on TTS schedule census permitting.  Essential hypertension  Controlled.  Continue hydralazine 25 mg 3 times daily and isosorbide dinitrate 10 mg 3 times daily.  Chronic systolic CHF  Compensated.  Gout  No acute flare.  Continue allopurinol.  Anemia  of chronic kidney disease  Stable.  Chronic back pain  All the way from the neck down to the sacrum.  See his outpatient chiropractor.  Supportive treatment.  Minimize/judicious  use of opioids.   DVT prophylaxis: Subcutaneous heparin Code Status: Full Family Communication: None at bedside. I discussed with the patient's son on 12/18, updated care and answered questions. Disposition: To be determined pending interventions and clinical improvement, not until sometime next week.   Consultants:   Nephrology Vascular surgery Orthopedics  Procedures:   As above  Antimicrobials:   IV vancomycin and Zosyn 12/16 >   Subjective:  Patient interviewed and examined along with her female RN in room.  Reports chronic back pain extending from her neck to her sacrum which is usual for her.  She sees a Restaurant manager, fast food for same.  At times has right sided sciatica.  No foot pain, chest pain or dyspnea reported.  Objective:   Vitals:   03/26/19 0632 03/26/19 0745 03/26/19 0811 03/26/19 1100  BP:  (!) 151/75  (!) 145/65  Pulse:  80  81  Resp: 19 14 (!) 23 (!) 23  Temp:  97.7 F (36.5 C)  97.7 F (36.5 C)  TempSrc:  Oral  Oral  SpO2:  97%  97%  Weight: 72.5 kg     Height:        General exam: Pleasant elderly female, moderately built and nourished sitting up comfortably in bed without distress. Respiratory system: Clear to auscultation.  No increased work of breathing. Cardiovascular system: S1 and S2 heard, RRR.  No JVD, murmurs or pedal edema.  Telemetry personally reviewed: Sinus rhythm. Gastrointestinal system: Abdomen is nondistended, soft and nontender. No organomegaly or masses felt. Normal bowel sounds heard. Central nervous system: Alert and oriented. No focal neurological deficits. Extremities: Symmetric 5 x 5 power. Skin: She has what appears to be an arterial ulcer on medial aspect of both calves with some slough but no obvious drainage or cellulitis features.  She has approximately 1 cm diameter right plantar midfoot ulcer without drainage, has surrounding callus formation.  She has a ulcer on the lateral aspect of the left little toe with purulent drainage  and surrounding patchy mild erythema of dorsum of foot.  Unable to palpate bilateral dorsalis pedis or posterior tibials.  Dressing to left foot clean and dry. Psychiatry: Judgement and insight appear normal. Mood & affect appropriate.     Data Reviewed:   I have personally reviewed following labs and imaging studies   CBC: Recent Labs  Lab 03/23/19 1355 03/25/19 0500  WBC 10.7* 8.2  NEUTROABS 8.0*  --   HGB 10.8* 11.6*  HCT 33.1* 36.3  MCV 94.6 94.0  PLT 274 408    Basic Metabolic Panel: Recent Labs  Lab 03/24/19 1407 03/25/19 0500 03/25/19 1000  NA 135 135 135  K 4.5 4.7 5.6*  CL 96* 96* 96*  CO2 25 23 21*  GLUCOSE 230* 188* 172*  BUN 63* 71* 71*  CREATININE 4.29* 4.61* 4.76*  CALCIUM 8.1* 7.9* 8.2*  PHOS 4.5  --  5.3*    Liver Function Tests: Recent Labs  Lab 03/23/19 1355 03/24/19 1407 03/25/19 1000  AST 22  --   --   ALT 23  --   --   ALKPHOS 192*  --   --   BILITOT 0.5  --   --   PROT 5.9*  --   --   ALBUMIN 2.8* 2.7* 2.8*    CBG: Recent Labs  Lab 03/26/19 0104 03/26/19 0630 03/26/19 1059  GLUCAP 247* 231* 240*    Microbiology Studies:   Recent Results (from the past 240 hour(s))  Culture, blood (Routine x 2)     Status: None (Preliminary result)   Collection Time: 03/23/19  1:38 PM   Specimen: BLOOD  Result Value Ref Range Status   Specimen Description BLOOD LEFT ANTECUBITAL  Final   Special Requests   Final    BOTTLES DRAWN AEROBIC AND ANAEROBIC Blood Culture results may not be optimal due to an inadequate volume of blood received in culture bottles   Culture   Final    NO GROWTH 2 DAYS Performed at [facility], [place] [address], [place], [place] [address]    Report Status PENDING  Incomplete  Culture, blood (Routine x 2)     Status: None (Preliminary result)   Collection Time: 03/23/19  3:38 PM   Specimen: BLOOD  Result Value Ref Range Status   Specimen Description BLOOD BLOOD LEFT FOREARM  Final   Special Requests    Final    BOTTLES DRAWN AEROBIC AND ANAEROBIC Blood Culture results may not be optimal due to an excessive volume of blood received in culture bottles   Culture   Final    NO GROWTH 2 DAYS Performed at [facility], [place] [address] [place], [place] [address]    Report Status PENDING  Incomplete  SARS CORONAVIRUS 2 (TAT 6-24 HRS) Nasopharyngeal Nasopharyngeal Swab     Status: None   Collection Time: 03/23/19  3:57 PM   Specimen: Nasopharyngeal Swab  Result Value Ref Range Status   SARS Coronavirus 2 NEGATIVE NEGATIVE Final    Comment: (NOTE) SARS-CoV-2 target nucleic acids are NOT DETECTED. The SARS-CoV-2 RNA is generally detectable in upper and lower respiratory specimens during the acute phase of infection. Negative results do not preclude SARS-CoV-2 infection, do not rule out co-infections with other pathogens, and should not be used as the sole basis for treatment or other patient management decisions. Negative results must be combined with clinical observations, patient history, and epidemiological information. The expected result is Negative. Fact Sheet for Patients: SugarRoll.be Fact Sheet for Healthcare Providers: https://www.woods-mathews.com/ This test is not yet approved or cleared by the [place] FDA and  has been authorized for detection and/or diagnosis of SARS-CoV-2 by FDA under an Emergency Use Authorization (EUA). This EUA will remain  in effect (meaning this test can be used) for the duration of the COVID-19 declaration under Section 56 4(b)(1) of the Act, 21 U.S.C. section 360bbb-3(b)(1), unless the authorization is terminated or revoked sooner. Performed at [facility], [place] [address] [place], [place] [address]   MRSA PCR Screening     Status: None   Collection Time: 03/24/19  6:10 AM   Specimen: Nasal Mucosa; Nasopharyngeal  Result Value Ref Range Status   MRSA by PCR NEGATIVE NEGATIVE Final     Comment:        The GeneXpert MRSA Assay (FDA approved for NASAL specimens only), is one component of a comprehensive MRSA colonization surveillance program. It is not intended to diagnose MRSA infection nor to guide or monitor treatment for MRSA infections. Performed at [facility], [place] [address] [place], [place] [address]      Radiology Studies:  VAS [name] LOWER EXTREMITY SAPHENOUS VEIN MAPPING  Result Date: 03/26/2019 LOWER EXTREMITY VEIN MAPPING Indications:        Pre-op Other Indications:  PAD Other Risk  Factors: Hx Rt GSV harvest for surgery.  Performing Technologist: June Leap RDMS, RVT  Examination Guidelines: A complete evaluation includes B-mode imaging, spectral Doppler, color Doppler, and power Doppler as needed of all accessible portions of each vessel. Bilateral testing is considered an integral part of a complete examination. Limited examinations for reoccurring indications may be performed as noted. +--------------+---------------+--------------------+--------------+-----------+  RT Diameter    RT Findings          GSV          LT Diameter  LT Findings      (cm)                                             (cm)                 +--------------+---------------+--------------------+--------------+-----------+                                 Saphenofemoral        0.75                                                    Junction                                +--------------+---------------+--------------------+--------------+-----------+               branching /0.15   Proximal thigh        0.38      branching                   cm branch                                                 +--------------+---------------+--------------------+--------------+-----------+                  branching        Mid thigh           0.30      branching  +--------------+---------------+--------------------+--------------+-----------+                                   Distal thigh         0.27                 +--------------+---------------+--------------------+--------------+-----------+      0.16                            Knee             0.35                 +--------------+---------------+--------------------+--------------+-----------+      0.22                         Prox calf  0.32      branching  +--------------+---------------+--------------------+--------------+-----------+      0.22        branching         Mid calf           0.23      branching  +--------------+---------------+--------------------+--------------+-----------+      0.27                        Distal calf          0.22                 +--------------+---------------+--------------------+--------------+-----------+    Preliminary      Scheduled Meds:   . allopurinol  100 mg Oral BID  . aspirin EC  81 mg Oral Daily  . [START ON 03/27/2019] Chlorhexidine Gluconate Cloth  6 each Topical Q0600  . cinacalcet  30 mg Oral Q supper  . darbepoetin (ARANESP) injection - DIALYSIS  60 mcg Intravenous Q Thu-HD  . doxercalciferol  4 mcg Intravenous Q T,Th,Sa-HD  . heparin  5,000 Units Subcutaneous Q12H  . hydrALAZINE  25 mg Oral Q8H  . insulin aspart  0-15 Units Subcutaneous TID WC  . insulin aspart  0-5 Units Subcutaneous QHS  . insulin detemir  10 Units Subcutaneous Daily  . isosorbide dinitrate  10 mg Oral TID  . sevelamer carbonate  1,600 mg Oral TID WC  . sodium chloride flush  3 mL Intravenous Q12H  . traZODone  25-100 mg Oral QHS  . vancomycin variable dose per unstable renal function (pharmacist dosing)   Does not apply See admin instructions    Continuous Infusions:   . sodium chloride    . sodium chloride    . sodium chloride    . piperacillin-tazobactam (ZOSYN)  IV 2.25 g (03/26/19 0952)     LOS: 3 days     Vernell Leep, MD, [place], [facility]. [facility]    To contact the attending provider between 7A-7P or the  covering provider during after hours 7P-7A, please log into the web site [email or web address] and access using universal [place] password for that web site. If you do not have the password, please call the hospital operator.  03/26/2019, 1:27 PM

## 2019-03-26 NOTE — Progress Notes (Addendum)
[facility] Progress Note   Subjective:   Patient seen and examined at bedside.  Biggest compliant is back pain.  Denies pain in leg or foot.  Denies SOB, CP, n/v/d, abdominal, weakness, dizziness and fatigue.   Objective Vitals:   03/26/19 0443 03/26/19 0632 03/26/19 0745 03/26/19 0811  BP: (!) 143/52  (!) 151/75   Pulse: 78  80   Resp: 12 19 14  (!) 23  Temp: 97.6 F (36.4 C)  97.7 F (36.5 C)   TempSrc: Oral  Oral   SpO2: 100%  97%   Weight:  72.5 kg    Height:       Physical Exam General:NAD, well appearing female, sitting on the side of the bed Heart:RRR Lungs:CTAB Abdomen: soft, NTND Extremities:trace edema on L, no edema on R. Dressings on b/l calves Dialysis Access: R AVF +b   Filed Weights   03/25/19 1340 03/25/19 1720 03/26/19 4970  Weight: 76.5 kg 75 kg 72.5 kg    Intake/Output Summary (Last 24 hours) at 03/26/2019 1057 Last data filed at 03/26/2019 0840 Gross per 24 hour  Intake 890 ml  Output 1500 ml  Net -610 ml    Additional Objective Labs: Basic Metabolic Panel: Recent Labs  Lab 03/24/19 1407 03/25/19 0500 03/25/19 1000  NA 135 135 135  K 4.5 4.7 5.6*  CL 96* 96* 96*  CO2 25 23 21*  GLUCOSE 230* 188* 172*  BUN 63* 71* 71*  CREATININE 4.29* 4.61* 4.76*  CALCIUM 8.1* 7.9* 8.2*  PHOS 4.5  --  5.3*   Liver Function Tests: Recent Labs  Lab 03/23/19 1355 03/24/19 1407 03/25/19 1000  AST 22  --   --   ALT 23  --   --   ALKPHOS 192*  --   --   BILITOT 0.5  --   --   PROT 5.9*  --   --   ALBUMIN 2.8* 2.7* 2.8*   CBC: Recent Labs  Lab 03/23/19 1355 03/25/19 0500  WBC 10.7* 8.2  NEUTROABS 8.0*  --   HGB 10.8* 11.6*  HCT 33.1* 36.3  MCV 94.6 94.0  PLT 274 252   Blood Culture    Component Value Date/Time   SDES BLOOD BLOOD LEFT FOREARM 03/23/2019 1538   SPECREQUEST  03/23/2019 1538    BOTTLES DRAWN AEROBIC AND ANAEROBIC Blood Culture results may not be optimal due to an excessive volume of blood received in  culture bottles   CULT  03/23/2019 1538    NO GROWTH 2 DAYS Performed at [facility] [address], [place], [place] [id number]    REPTSTATUS PENDING 03/23/2019 1538    CBG: Recent Labs  Lab 03/25/19 1140 03/25/19 1724 03/25/19 2114 03/26/19 0104 03/26/19 0630  GLUCAP 280* 211* 285* 247* 231*    Lab Results  Component Value Date   INR 1.2 03/23/2019   INR 1.17 11/30/2017   INR 1.24 11/26/2017   Studies/Results: PERIPHERAL VASCULAR CATHETERIZATION  Result Date: 03/25/2019 Patient name: MELADY CHOW         MRN: [id number]        DOB: [birth year]          Sex: female  03/25/2019 Pre-operative Diagnosis: Critical limb ischemia of the bilateral lower extremities with bilateral lower extremity tissue loss.  Current hospital admission prompted by left fifth metatarsal osteomyelitis and open left foot wound. Post-operative diagnosis:  Same Surgeon:  Marty Heck, MD Procedure Performed: 1.  Ultrasound-guided access of the right  common femoral artery 2.  Aortogram 3.  Bilateral lower extremity arteriogram with runoff 4.  32 minutes of monitored moderate conscious sedation time  Indications: Patient is a 73 year old female with multiple medical comorbidities well-known to the vascular surgery service that was admitted with open wound and left fifth metatarsal osteomyelitis after failure of outpatient antibiotics.  Ultimately she has known bilateral SFA occlusions with monophasic runoff at the ankle from vascular studies earlier this year.  She was previously evaluated for right lower extremity wound by Dr. Donnetta Hutching that was healing at the time of initial evaluation in April of this year.  Her right foot wound persists at this time.  She now presents for aortogram, bilateral lower extremity arteriogram, possible intervention after risk and benefits were discussed.  Findings:  Aortogram showed calcified but patent aortoiliac segment bilaterally with no overt flow-limiting stenosis.   Left lower extremity arteriogram shows at least 70% stenosis in the left common femoral artery with heavily calcified plaque.  Patient has a flush SFA occlusion on the left with profunda runoff.  Her SFA is occluded throughout its proximal mid and distal segment.  She does reconstitute a heavily diseased above-knee popliteal artery.  The below-knee popliteal artery looks much healthier although heavily calcified.  Patient's dominant runoff is via the anterior tibial but does appear to have at least three-vessel runoff.  There is significant small vessel disease in the left foot.  Right lower extremity arteriogram shows again at least 50% common femoral artery stenosis with dominant runoff via the profunda.  There is a very tiny SFA stump with large calcified plaque but then it occludes shortly after takeoff.  Again the SFA is occluded throughout its entire segment.  She does reconstitute heavily diseased below-knee popliteal artery with near occlusive plaque and single-vessel runoff via an anterior tibial artery.             Procedure:  The patient was identified in the holding area and taken to room 8.  The patient was then placed supine on the table and prepped and draped in the usual sterile fashion.  A time out was called.  Ultrasound was used to evaluate the right common femoral artery.  It was patent .  A digital ultrasound image was acquired.  A micropuncture needle was used to access the right common femoral artery under ultrasound guidance.  An 018 wire was advanced without resistance and a micropuncture sheath was placed.  The 018 wire was removed and a benson wire was placed.  The micropuncture sheath was exchanged for a 5 french sheath.  An omniflush catheter was advanced over the wire to the level of L-1.  An abdominal angiogram was obtained.  Next, the Omni Flush catheter was pulled down in the aorta and bilateral lower extremity runoff was obtained.  Pertinent findings are noted above.  Given the  patient had a sheath in the right common femoral artery that was fairly diseased we had fairly limited evaluation of the right lower extremity.  As a result we then removed the Omni Flush catheter over a wire.  We then connected our contrast tubing to the sheath in the right common femoral artery and retrograde runoff was obtained of the right lower extremity in staged images to get better views.  All pertinent findings are noted above.  Once imaging was complete, the sheath in the right common femoral artery was then pulled and manual pressure was held for 20 minutes.  She tolerated the procedure without any apparent  complications.  Plan: Patient will be scheduled for left common femoral endarterectomy with left femoral to below-knee popliteal bypass on Monday with Dr. Donnetta Hutching.  Vein mapping has been ordered.  Marty Heck, MD [facility] Office: [phone] Pager: [phone]  Marty Heck  VAS [name] LOWER EXTREMITY SAPHENOUS VEIN MAPPING  Result Date: 03/26/2019 LOWER EXTREMITY VEIN MAPPING Indications:        Pre-op Other Indications:  PAD Other Risk Factors: Hx Rt GSV harvest for surgery.  Performing Technologist: June Leap RDMS, RVT  Examination Guidelines: A complete evaluation includes B-mode imaging, spectral Doppler, color Doppler, and power Doppler as needed of all accessible portions of each vessel. Bilateral testing is considered an integral part of a complete examination. Limited examinations for reoccurring indications may be performed as noted. +--------------+---------------+--------------------+--------------+-----------+  RT Diameter    RT Findings          GSV          LT Diameter  LT Findings      (cm)                                             (cm)                 +--------------+---------------+--------------------+--------------+-----------+                                 Saphenofemoral        0.75                                                     Junction                                +--------------+---------------+--------------------+--------------+-----------+               branching /0.15   Proximal thigh        0.38      branching                   cm branch                                                 +--------------+---------------+--------------------+--------------+-----------+                  branching        Mid thigh           0.30      branching  +--------------+---------------+--------------------+--------------+-----------+                                  Distal thigh         0.27                 +--------------+---------------+--------------------+--------------+-----------+      0.16  Knee             0.35                 +--------------+---------------+--------------------+--------------+-----------+      0.22                         Prox calf           0.32      branching  +--------------+---------------+--------------------+--------------+-----------+      0.22        branching         Mid calf           0.23      branching  +--------------+---------------+--------------------+--------------+-----------+      0.27                        Distal calf          0.22                 +--------------+---------------+--------------------+--------------+-----------+    Preliminary     Medications: . sodium chloride    . sodium chloride    . sodium chloride    . piperacillin-tazobactam (ZOSYN)  IV 2.25 g (03/26/19 0952)   . allopurinol  100 mg Oral BID  . aspirin EC  81 mg Oral Daily  . [START ON 03/27/2019] Chlorhexidine Gluconate Cloth  6 each Topical Q0600  . cinacalcet  30 mg Oral Q supper  . darbepoetin (ARANESP) injection - DIALYSIS  60 mcg Intravenous Q Thu-HD  . doxercalciferol  4 mcg Intravenous Q T,Th,Sa-HD  . heparin  5,000 Units Subcutaneous Q12H  . hydrALAZINE  25 mg Oral Q8H  . insulin aspart  0-15 Units  Subcutaneous TID WC  . insulin aspart  0-5 Units Subcutaneous QHS  . insulin detemir  10 Units Subcutaneous Daily  . isosorbide dinitrate  10 mg Oral TID  . sevelamer carbonate  1,600 mg Oral TID WC  . sodium chloride flush  3 mL Intravenous Q12H  . traZODone  25-100 mg Oral QHS  . vancomycin variable dose per unstable renal function (pharmacist dosing)   Does not apply See admin instructions    Dialysis Orders: [facility], TTS, EDW 72 kg, BFR 350, DFR 800, Optiflux 180, 3K/2.25 calcium, vancomycin 750 mg q. HD, Mircera 50 mcg every 2 weeks, Hectorol 4 mcg IV 3 times weekly, heparin 5000 unit bolus. Right radiocephalic fistula.  Assessment/Plan: 1. Left foot 5th metatarsal osteo/septic arthritis - failed OP antibiotic treatment. s/p angiogram by Dr. Carlis Abbott on 12/18.  Plans for L femoral endarterectomy with L femoropopliteal bypass on 12/21.  Ortho/Dr. Sharol Given may have surgical intervention pending improvement after vascular procedure. On Vanc and Zosyn 2. ESRD - on HD TTS. Off schedule yesterday due to high census.  Plan to run shorted treatment today to get back on schedule. Last K 5.6 pre HD. 3. Anemia of CKD- Hgb 11.6.  Aranesp 87mcg qwk 4. Secondary hyperparathyroidism - Calcium and phos in goal. Continue hectorol, sensipar and renvela 2AC 5. HTN/volume - Blood pressure well controlled.  Does not appear volume overloaded.  Plan for dialysis with net UF removal to dry weight.  6. Nutrition - Renal diet w/fluid restrictions.  7. DM - per primary  Jen Mow, PA-C [facility] Pager: [phone] 03/26/2019,10:57 AM  LOS: 3 days   I have seen and examined this patient and agree with plan and assessment  in the above note with renal recommendations/intervention highlighted. Off schedule and to have surgery on Monday.  Will plan for short session today to get back on TTS as census permits.  Broadus John A Numa Heatwole,MD 03/26/2019 11:24 AM

## 2019-03-26 NOTE — Progress Notes (Signed)
Pharmacy Antibiotic Note  [name] is a 73 y.o. female admitted on 03/23/2019 with foot wound with imaging showing septic arthritis with osteomyelitis, surrounding inflammatory phlegmon and abscess, diffuse cellulitis and myofascitis, for Vancomycin.    Random Vancomycin level this morning 26, HD session today only 3 hours.  Plan: Vancomycin 500 mg IV now, then 750 mg IV after each HD  Height: 5\' 4"  (162.6 cm) Weight: 159 lb 13.3 oz (72.5 kg) IBW/kg (Calculated) : 54.7  Temp (24hrs), Avg:97.9 F (36.6 C), Min:97.6 F (36.4 C), Max:98.2 F (36.8 C)  Recent Labs  Lab 03/23/19 1338 03/23/19 1355 03/23/19 1655 03/24/19 1407 03/25/19 0500 03/25/19 1000 03/26/19 1416 03/26/19 2013  WBC  --  10.7*  --   --  8.2  --   --  10.8*  CREATININE  --  3.40*  --  4.29* 4.61* 4.76*  --  3.52*  LATICACIDVEN 1.1  --  1.0  --   --   --   --   --   VANCORANDOM  --   --   --  33  --   --  26  --     Estimated Creatinine Clearance: 13.9 mL/min (A) (by C-G formula based on SCr of 3.52 mg/dL (H)).    Allergies  Allergen Reactions  . Atenolol Other (See Comments) and Rash    Exacerbates gout Exacerbates gout  . Adhesive [Tape] Other (See Comments)    SKIN IS VERY SENSITIVE AND BRUISES AND TEARS EASILY; PLEASE USE AN ALTERNATIVE TO TAPE!!  . Contrast Media [Iodinated Diagnostic Agents] Rash  . Iohexol Rash and Other (See Comments)  . Latex Rash   Phillis Knack, PharmD, BCPS  03/26/2019 11:00 PM

## 2019-03-26 NOTE — Progress Notes (Signed)
LE vein mapping       has been completed. Preliminary results can be found under CV proc through chart review. Kani Jobson, BS, RDMS, RVT   

## 2019-03-27 LAB — GLUCOSE, CAPILLARY
Glucose-Capillary: 106 mg/dL — ABNORMAL HIGH (ref 70–99)
Glucose-Capillary: 185 mg/dL — ABNORMAL HIGH (ref 70–99)
Glucose-Capillary: 206 mg/dL — ABNORMAL HIGH (ref 70–99)
Glucose-Capillary: 164 mg/dL — ABNORMAL HIGH (ref 70–99)

## 2019-03-27 MED ORDER — LIDOCAINE HCL (PF) 1 % IJ SOLN: 5.0000 mL | INTRAMUSCULAR | Status: DC | PRN

## 2019-03-27 MED ORDER — SODIUM CHLORIDE 0.9 % IV SOLN: 100.0000 mL | INTRAVENOUS | Status: DC | PRN

## 2019-03-27 MED ORDER — HEPARIN SODIUM (PORCINE) 1000 UNIT/ML DIALYSIS
1000.0000 [IU] | INTRAMUSCULAR | Status: DC | PRN
  Filled 2019-03-27: qty 1

## 2019-03-27 MED ORDER — DARBEPOETIN ALFA 60 MCG/0.3ML IJ SOSY
60.0000 ug | PREFILLED_SYRINGE | INTRAMUSCULAR | Status: DC
  Filled 2019-03-27 (×2): qty 0.3

## 2019-03-27 MED ORDER — SODIUM CHLORIDE 0.9 % IV SOLN
1.5000 g | INTRAVENOUS | Status: AC
  Administered 2019-03-28: 1.5 g via INTRAVENOUS
  Filled 2019-03-27 (×2): qty 1.5

## 2019-03-27 MED ORDER — ALTEPLASE 2 MG IJ SOLR: 2.0000 mg | Freq: Once | INTRAMUSCULAR | Status: DC | PRN

## 2019-03-27 MED ORDER — PENTAFLUOROPROP-TETRAFLUOROETH EX AERO: 1.0000 "application " | INHALATION_SPRAY | CUTANEOUS | Status: DC | PRN

## 2019-03-27 MED ORDER — LIDOCAINE-PRILOCAINE 2.5-2.5 % EX CREA: 1.0000 "application " | TOPICAL_CREAM | CUTANEOUS | Status: DC | PRN

## 2019-03-27 NOTE — Progress Notes (Signed)
Notified Dr Algis Liming ref to pt bleeding from heparin sub q injection site hours after injection. SCD's placed. Will continue to monitor.

## 2019-03-27 NOTE — Progress Notes (Signed)
PROGRESS NOTE   [name]  JJO:[id number]    DOB: [birth year]    DOA: 03/23/2019  PCP: Unk Pinto, MD   I have briefly reviewed patients previous medical records in [facility].  Chief Complaint:  Patient sent from wound care center due to infected left foot wound that failed outpatient antibiotics.  Brief Narrative:  73 year old married female, independent, PMH of CAD s/p CABG, chronic combined CHF/ICM, stage IV CKD (baseline creatinine 2.7-3.1), HTN, HLD, DM, lower extremity PAD (bilateral SFA occlusion 07/2018), chronic bilateral feet ulcerations, osteomyelitis of left toe, LBBB who has had a chronic wound on her left foot that is followed at the wound care center closely, recently noted worsening infection and started on IV antibiotics including vancomycin at dialysis (reportedly completed 3 doses) sent to [facility] from the wound care center due to low-grade fevers and increased drainage from the ulcer.  Admitted for left little toe osteomyelitis, abscess and sepsis complicating PAD.  Nephrology consulted for HD needs.  S/p aortogram with bilateral lower extremity arteriogram with runoff 12/18 with plans for left common femoral endarterectomy with left femoropopliteal bypass on 12/21.  Orthopedics/Dr. Sharol Given plans surgical intervention after vascular procedure.   Assessment & Plan:  Active Problems:   PVD (peripheral vascular disease) ([place])   ESRD on hemodialysis ([place])   Pyogenic inflammation of bone (HCC)   Osteomyelitis of foot, left, acute (HCC)   Septic arthritis of left foot (HCC)   Cellulitis of left foot   Abscess of right foot   Left little toe infected chronic ulcer with abscess and osteomyelitis, complicating PAD and peripheral neuropathy.  Vascular surgery and orthopedics consultation appreciated.  Dr. Carlis Abbott, VVS S/p aortogram with bilateral lower extremity arteriogram with runoff 12/18 with plans for left common femoral endarterectomy with left  femoro to below knee popliteal bypass on 12/21.    Dr. Sharol Given, Orthopedics will consider surgical intervention pending vascular intervention status.  Failed outpatient antibiotic treatment.  Continue empirically started IV vancomycin and Zosyn.  Wound culture OP showed Serratia.  Blood cultures x2 from 12/16: Remain negative to date after 4 days.  MRSA PCR negative.  Plans as noted above.  Chronic right foot, bilateral leg ulcers  Patient has bilateral leg ulcers, suspected arterial ulcers from PAD.  Fairly clean.  She also has chronic right foot mid plantar ulcer with some slough but no overt drainage.  Orthopedics may consider intervention pending vascular surgery clearance.  PAD  Management as per vascular surgery as noted above.  Type II DM with peripheral neuropathy  Recent A1c 6.3.  Reports occasional hypoglycemic symptoms on home regimen of 70/30 insulin.  Currently only on SSI with widely fluctuating CBGs as noted below.  Adjust SSI and monitor closely.  Will consider maintenance long-acting insulin/Levemir if persistently high CBGs.  Patient reports that she takes 70/30 at home on a sliding scale regimen.  Usually takes 10-20 units in the morning and maybe 10 units at night.  Initiated Levemir 10 units daily.  Much improved glycemic control as noted below.  Adjust insulins as needed.  ESRD on TTS HD  Nephrology following.  Off scheduled dialysis 11/18.  Hyperkalemia 5.6 was predialysis today.    Last HD 12/19.  Next HD planned for 12/22.  Essential hypertension  Controlled.  Continue hydralazine 25 mg 3 times daily and isosorbide dinitrate 10 mg 3 times daily.  Chronic systolic CHF  Compensated.  Gout  No acute flare.  Continue allopurinol.  Anemia of chronic kidney  disease  Stable.  Chronic back pain  All the way from the neck down to the sacrum.  See his outpatient chiropractor.  Supportive treatment.  Minimize/judicious use of opioids.   DVT  prophylaxis: Subcutaneous heparin was discontinued on 12/19 due to prolonged blood oozing from injection site which happened repeatedly.  SCDs now. Code Status: Full Family Communication: None at bedside. I discussed with the patient's son on 12/18, updated care and answered questions. Disposition: To be determined pending interventions and clinical improvement, not until sometime next week.   Consultants:   Nephrology Vascular surgery Orthopedics  Procedures:   As above  Antimicrobials:   IV vancomycin and Zosyn 12/16 >   Subjective:  States that she was up in the chair for a couple of hours yesterday which helped her back pain.  Denies left foot or leg pains.  No further bleeding from subcutaneous heparin sites that was noted yesterday.  Objective:   Vitals:   03/26/19 2306 03/27/19 0429 03/27/19 0836 03/27/19 1100  BP: 132/77 (!) 148/70 139/63 (!) 164/61  Pulse: 77  76 76  Resp: 20 (!) 21 15 20   Temp: 97.9 F (36.6 C) (!) 97.1 F (36.2 C) 97.9 F (36.6 C) 98 F (36.7 C)  TempSrc: Oral Oral Oral Oral  SpO2: 93% 99% 100% 100%  Weight:      Height:        General exam: Pleasant elderly female, moderately built and nourished lying comfortably supine in bed without distress. Respiratory system: Clear to auscultation.  No increased work of breathing. Cardiovascular system: S1 and S2 heard, RRR.  No JVD, murmurs or pedal edema.  Telemetry personally reviewed: Sinus rhythm. Gastrointestinal system: Abdomen is nondistended, soft and nontender. No organomegaly or masses felt. Normal bowel sounds heard. Central nervous system: Alert and oriented. No focal neurological deficits. Extremities: Symmetric 5 x 5 power. Skin: She has what appears to be an arterial ulcer on medial aspect of both calves with some slough but no obvious drainage or cellulitis features.  She has approximately 1 cm diameter right plantar midfoot ulcer without drainage, has surrounding callus formation.  She  has a ulcer on the lateral aspect of the left little toe with purulent drainage and surrounding patchy mild erythema of dorsum of foot.  Unable to palpate bilateral dorsalis pedis or posterior tibials.  Dressings on all the wounds were somewhat soiled, removed dressing, decreased slough in all, no erythema or active drainage. Psychiatry: Judgement and insight appear normal. Mood & affect appropriate.     Data Reviewed:   I have personally reviewed following labs and imaging studies   CBC: Recent Labs  Lab 03/23/19 1355 03/25/19 0500 03/26/19 2013  WBC 10.7* 8.2 10.8*  NEUTROABS 8.0*  --   --   HGB 10.8* 11.6* 10.3*  HCT 33.1* 36.3 31.0*  MCV 94.6 94.0 93.7  PLT 274 252 144    Basic Metabolic Panel: Recent Labs  Lab 03/24/19 1407 03/25/19 0500 03/25/19 1000 03/26/19 2013  NA 135 135 135 133*  K 4.5 4.7 5.6* 3.9  CL 96* 96* 96* 95*  CO2 25 23 21* 25  GLUCOSE 230* 188* 172* 151*  BUN 63* 71* 71* 48*  CREATININE 4.29* 4.61* 4.76* 3.52*  CALCIUM 8.1* 7.9* 8.2* 7.7*  PHOS 4.5  --  5.3* 4.3    Liver Function Tests: Recent Labs  Lab 03/23/19 1355 03/24/19 1407 03/25/19 1000 03/26/19 2013  AST 22  --   --   --   ALT  23  --   --   --   ALKPHOS 192*  --   --   --   BILITOT 0.5  --   --   --   PROT 5.9*  --   --   --   ALBUMIN 2.8* 2.7* 2.8* 2.5*    CBG: Recent Labs  Lab 03/26/19 2302 03/27/19 0629 03/27/19 1134  GLUCAP 104* 106* 185*    Microbiology Studies:   Recent Results (from the past 240 hour(s))  Culture, blood (Routine x 2)     Status: None (Preliminary result)   Collection Time: 03/23/19  1:38 PM   Specimen: BLOOD  Result Value Ref Range Status   Specimen Description BLOOD LEFT ANTECUBITAL  Final   Special Requests   Final    BOTTLES DRAWN AEROBIC AND ANAEROBIC Blood Culture results may not be optimal due to an inadequate volume of blood received in culture bottles   Culture   Final    NO GROWTH 4 DAYS Performed at [facility],  [place] [address] [place], [place] [address]    Report Status PENDING  Incomplete  Culture, blood (Routine x 2)     Status: None (Preliminary result)   Collection Time: 03/23/19  3:38 PM   Specimen: BLOOD  Result Value Ref Range Status   Specimen Description BLOOD BLOOD LEFT FOREARM  Final   Special Requests   Final    BOTTLES DRAWN AEROBIC AND ANAEROBIC Blood Culture results may not be optimal due to an excessive volume of blood received in culture bottles   Culture   Final    NO GROWTH 4 DAYS Performed at [facility], [place] [address], [place], [place] [address]    Report Status PENDING  Incomplete  SARS CORONAVIRUS 2 (TAT 6-24 HRS) Nasopharyngeal Nasopharyngeal Swab     Status: None   Collection Time: 03/23/19  3:57 PM   Specimen: Nasopharyngeal Swab  Result Value Ref Range Status   SARS Coronavirus 2 NEGATIVE NEGATIVE Final    Comment: (NOTE) SARS-CoV-2 target nucleic acids are NOT DETECTED. The SARS-CoV-2 RNA is generally detectable in upper and lower respiratory specimens during the acute phase of infection. Negative results do not preclude SARS-CoV-2 infection, do not rule out co-infections with other pathogens, and should not be used as the sole basis for treatment or other patient management decisions. Negative results must be combined with clinical observations, patient history, and epidemiological information. The expected result is Negative. Fact Sheet for Patients: SugarRoll.be Fact Sheet for Healthcare Providers: https://www.woods-mathews.com/ This test is not yet approved or cleared by the [place] FDA and  has been authorized for detection and/or diagnosis of SARS-CoV-2 by FDA under an Emergency Use Authorization (EUA). This EUA will remain  in effect (meaning this test can be used) for the duration of the COVID-19 declaration under Section 56 4(b)(1) of the Act, 21 U.S.C. section 360bbb-3(b)(1), unless the  authorization is terminated or revoked sooner. Performed at [facility], [place] [address] [place], [place] [address]   MRSA PCR Screening     Status: None   Collection Time: 03/24/19  6:10 AM   Specimen: Nasal Mucosa; Nasopharyngeal  Result Value Ref Range Status   MRSA by PCR NEGATIVE NEGATIVE Final    Comment:        The GeneXpert MRSA Assay (FDA approved for NASAL specimens only), is one component of a comprehensive MRSA colonization surveillance program. It is not intended to diagnose MRSA infection nor  to guide or monitor treatment for MRSA infections. Performed at [facility], [place] [address], [place], [place] [address]      Radiology Studies:  No results found.   Scheduled Meds:   . allopurinol  100 mg Oral BID  . aspirin EC  81 mg Oral Daily  . Chlorhexidine Gluconate Cloth  6 each Topical Q0600  . cinacalcet  30 mg Oral Q supper  . [START ON 03/29/2019] darbepoetin (ARANESP) injection - DIALYSIS  60 mcg Intravenous Q Tue-HD  . doxercalciferol  4 mcg Intravenous Q T,Th,Sa-HD  . hydrALAZINE  25 mg Oral Q8H  . insulin aspart  0-15 Units Subcutaneous TID WC  . insulin aspart  0-5 Units Subcutaneous QHS  . insulin detemir  10 Units Subcutaneous Daily  . isosorbide dinitrate  10 mg Oral TID  . sevelamer carbonate  1,600 mg Oral TID WC  . sodium chloride flush  3 mL Intravenous Q12H  . traZODone  25-100 mg Oral QHS  . vancomycin variable dose per unstable renal function (pharmacist dosing)   Does not apply See admin instructions    Continuous Infusions:   . sodium chloride    . sodium chloride    . sodium chloride    . sodium chloride    . sodium chloride    . [START ON 03/28/2019] cefUROXime (ZINACEF)  IV    . piperacillin-tazobactam (ZOSYN)  IV 2.25 g (03/27/19 1043)  . [START ON 03/29/2019] vancomycin       LOS: 4 days     Vernell Leep, MD, [place], [facility]. [facility]    To contact the attending provider between 7A-7P or the  covering provider during after hours 7P-7A, please log into the web site [email or web address] and access using universal [place] password for that web site. If you do not have the password, please call the hospital operator.  03/27/2019, 1:46 PM

## 2019-03-27 NOTE — Progress Notes (Signed)
[facility]  HPI: Critical limb ischemia of the bilateral lower extremities with bilateral lower extremity tissue loss.  Current hospital admission prompted by left fifth metatarsal osteomyelitis and open left foot wound.   Objective (!) 148/70 77 (!) 97.1 F (36.2 C) (Oral) (!) 21 99%  Intake/Output Summary (Last 24 hours) at 03/27/2019 0749 Last data filed at 03/26/2019 2220 Gross per 24 hour  Intake 640 ml  Output 1500 ml  Net -860 ml   Left lateral foot wound at 5th metatarsal.    +--------------+---------------+--------------------+--------------+-----------+  RT Diameter    RT Findings          GSV          LT Diameter  LT Findings      (cm)                                             (cm)                 +--------------+---------------+--------------------+--------------+-----------+                                 Saphenofemoral        0.75                                                    Junction                                +--------------+---------------+--------------------+--------------+-----------+               branching /0.15   Proximal thigh        0.38      branching                   cm branch                                                 +--------------+---------------+--------------------+--------------+-----------+                  branching        Mid thigh           0.30      branching  +--------------+---------------+--------------------+--------------+-----------+                                  Distal thigh         0.27                 +--------------+---------------+--------------------+--------------+-----------+      0.16                            Knee             0.35                 +--------------+---------------+--------------------+--------------+-----------+  0.22                         Prox calf           0.32      branching   +--------------+---------------+--------------------+--------------+-----------+      0.22        branching         Mid calf           0.23      branching  +--------------+---------------+--------------------+--------------+-----------+      0.27                        Distal calf          0.22                 +--------------+---------------+--------------------+--------------+-----------+  Assessment/Planning:  PAD with B LE tissue loss B SFA occlusions.   POD # 2 Aortogram  Left lower extremity arteriogram shows at least 70% stenosis in the left common femoral artery with heavily calcified plaque.  Patient has a flush SFA occlusion on the left with profunda runoff.  Her SFA is occluded throughout its proximal mid and distal segment.  She does reconstitute a heavily diseased above-knee popliteal artery.  The below-knee popliteal artery looks much healthier although heavily calcified.  Patient's dominant runoff is via the anterior tibial but does appear to have at least three-vessel runoff.  There is significant small vessel disease in the left foot.  Plan: Patient will be scheduled for left common femoral endarterectomy with left femoral to below-knee popliteal bypass on Monday with Dr. Donnetta Hutching.  He Left LE appears to have acceptable GSV for bypass with a narrowed section at the mid calf.    NPO past MN   Roxy Horseman 03/27/2019 7:49 AM --  Laboratory Lab Results: Recent Labs    03/25/19 0500 03/26/19 2013  WBC 8.2 10.8*  HGB 11.6* 10.3*  HCT 36.3 31.0*  PLT 252 266   BMET Recent Labs    03/25/19 1000 03/26/19 2013  NA 135 133*  K 5.6* 3.9  CL 96* 95*  CO2 21* 25  GLUCOSE 172* 151*  BUN 71* 48*  CREATININE 4.76* 3.52*  CALCIUM 8.2* 7.7*    COAG Lab Results  Component Value Date   INR 1.2 03/23/2019   INR 1.17 11/30/2017   INR 1.24 11/26/2017   No results found for: PTT

## 2019-03-27 NOTE — Progress Notes (Addendum)
[facility] Progress Note   Subjective:   Patient seen and examined at bedside.  Back pain slightly improved today.  No new complaints.  Denies SOB, CP, n/v/d, and edema.  Objective Vitals:   03/26/19 2306 03/27/19 0429 03/27/19 0836 03/27/19 1100  BP: 132/77 (!) 148/70 139/63 (!) 164/61  Pulse: 77  76 76  Resp: 20 (!) 21 15 20   Temp: 97.9 F (36.6 C) (!) 97.1 F (36.2 C) 97.9 F (36.6 C) 98 F (36.7 C)  TempSrc: Oral Oral Oral Oral  SpO2: 93% 99% 100% 100%  Weight:      Height:       Physical Exam General:NAD, well appearing female, sitting in bed Heart:RRR Lungs:CTAB Abdomen:soft, NTND Extremities:no LE edema Dialysis Access: RU AVF +b   Filed Weights   03/26/19 9449 03/26/19 1910 03/26/19 2220  Weight: 72.5 kg 73.7 kg 72.5 kg    Intake/Output Summary (Last 24 hours) at 03/27/2019 1227 Last data filed at 03/26/2019 2220 Gross per 24 hour  Intake 440 ml  Output 1500 ml  Net -1060 ml    Additional Objective Labs: Basic Metabolic Panel: Recent Labs  Lab 03/24/19 1407 03/25/19 0500 03/25/19 1000 03/26/19 2013  NA 135 135 135 133*  K 4.5 4.7 5.6* 3.9  CL 96* 96* 96* 95*  CO2 25 23 21* 25  GLUCOSE 230* 188* 172* 151*  BUN 63* 71* 71* 48*  CREATININE 4.29* 4.61* 4.76* 3.52*  CALCIUM 8.1* 7.9* 8.2* 7.7*  PHOS 4.5  --  5.3* 4.3   Liver Function Tests: Recent Labs  Lab 03/23/19 1355 03/24/19 1407 03/25/19 1000 03/26/19 2013  AST 22  --   --   --   ALT 23  --   --   --   ALKPHOS 192*  --   --   --   BILITOT 0.5  --   --   --   PROT 5.9*  --   --   --   ALBUMIN 2.8* 2.7* 2.8* 2.5*   CBC: Recent Labs  Lab 03/23/19 1355 03/25/19 0500 03/26/19 2013  WBC 10.7* 8.2 10.8*  NEUTROABS 8.0*  --   --   HGB 10.8* 11.6* 10.3*  HCT 33.1* 36.3 31.0*  MCV 94.6 94.0 93.7  PLT 274 252 266   Blood Culture    Component Value Date/Time   SDES BLOOD BLOOD LEFT FOREARM 03/23/2019 1538   SPECREQUEST  03/23/2019 1538    BOTTLES DRAWN AEROBIC  AND ANAEROBIC Blood Culture results may not be optimal due to an excessive volume of blood received in culture bottles   CULT  03/23/2019 1538    NO GROWTH 4 DAYS Performed at [place] [address], [place], [place] [address]    REPTSTATUS PENDING 03/23/2019 1538    CBG: Recent Labs  Lab 03/26/19 1059 03/26/19 1607 03/26/19 2302 03/27/19 0629 03/27/19 1134  GLUCAP 240* 212* 104* 106* 185*    Lab Results  Component Value Date   INR 1.2 03/23/2019   INR 1.17 11/30/2017   INR 1.24 11/26/2017   Studies/Results: VAS [name] LOWER EXTREMITY SAPHENOUS VEIN MAPPING  Result Date: 03/26/2019 LOWER EXTREMITY VEIN MAPPING Indications:        Pre-op Other Indications:  PAD Other Risk Factors: Hx Rt GSV harvest for surgery.  Performing Technologist: June Leap RDMS, RVT  Examination Guidelines: A complete evaluation includes B-mode imaging, spectral Doppler, color Doppler, and power Doppler as needed of all accessible portions of each vessel. Bilateral  testing is considered an integral part of a complete examination. Limited examinations for reoccurring indications may be performed as noted. +--------------+---------------+--------------------+--------------+-----------+  RT Diameter    RT Findings          GSV          LT Diameter  LT Findings      (cm)                                             (cm)                 +--------------+---------------+--------------------+--------------+-----------+                                 Saphenofemoral        0.75                                                    Junction                                +--------------+---------------+--------------------+--------------+-----------+               branching /0.15   Proximal thigh        0.38      branching                   cm branch                                                 +--------------+---------------+--------------------+--------------+-----------+                   branching        Mid thigh           0.30      branching  +--------------+---------------+--------------------+--------------+-----------+                                  Distal thigh         0.27                 +--------------+---------------+--------------------+--------------+-----------+      0.16                            Knee             0.35                 +--------------+---------------+--------------------+--------------+-----------+      0.22                         Prox calf           0.32      branching  +--------------+---------------+--------------------+--------------+-----------+      0.22        branching         Mid calf  0.23      branching  +--------------+---------------+--------------------+--------------+-----------+      0.27                        Distal calf          0.22                 +--------------+---------------+--------------------+--------------+-----------+ Diagnosing physician: Harold Barban MD Electronically signed by Harold Barban MD on 03/26/2019 at 4:39:30 PM.    Final     Medications: . sodium chloride    . sodium chloride    . sodium chloride    . sodium chloride    . sodium chloride    . [START ON 03/28/2019] cefUROXime (ZINACEF)  IV    . piperacillin-tazobactam (ZOSYN)  IV 2.25 g (03/27/19 1043)  . [START ON 03/29/2019] vancomycin     . allopurinol  100 mg Oral BID  . aspirin EC  81 mg Oral Daily  . Chlorhexidine Gluconate Cloth  6 each Topical Q0600  . cinacalcet  30 mg Oral Q supper  . darbepoetin (ARANESP) injection - DIALYSIS  60 mcg Intravenous Q Thu-HD  . doxercalciferol  4 mcg Intravenous Q T,Th,Sa-HD  . hydrALAZINE  25 mg Oral Q8H  . insulin aspart  0-15 Units Subcutaneous TID WC  . insulin aspart  0-5 Units Subcutaneous QHS  . insulin detemir  10 Units Subcutaneous Daily  . isosorbide dinitrate  10 mg Oral TID  . sevelamer carbonate  1,600 mg Oral TID WC  . sodium chloride flush   3 mL Intravenous Q12H  . traZODone  25-100 mg Oral QHS  . vancomycin variable dose per unstable renal function (pharmacist dosing)   Does not apply See admin instructions    Dialysis Orders: [facility], TTS, EDW 72 kg, BFR 350, DFR 800, Optiflux 180, 3K/2.25 calcium, vancomycin 750 mg q. HD, Mircera 50 mcg every 2 weeks, Hectorol 4 mcg IV 3 times weekly, heparin 5000 unit bolus. Right radiocephalic fistula.  Assessment/Plan: 1. Left foot 5th metatarsal osteo/septic arthritis - failed OP antibiotic treatment. s/p angiogram by Dr. Carlis Abbott on 12/18.  Plans for L femoral endarterectomy with L femoropopliteal bypass on 12/21.  Ortho/Dr. Sharol Given may have surgical intervention pending improvement after vascular procedure. On Vanc and Zosyn 2. ESRD - on HD TTS. HD yesterday tolerated well.  K 3.9.  Next HD on 12/22. 3. Anemia of CKD- Hgb 10.3.  Aranesp 15mcg qwk not given due to schedule changes, ordered w/HD tues. 4. Secondary hyperparathyroidism - Calcium and phos in goal. Continue hectorol, sensipar and renvela 2AC 5. HTN/volume - Blood pressure well controlled.  Does not appear volume overloaded.  Plan for dialysis with net UF removal to dry weight.  6. Nutrition - Renal diet w/fluid restrictions.  7. DM - per primary  Jen Mow, PA-C [facility] Pager: [phone] 03/26/2019,10:57 AM  LOS: 3 days  Jen Mow, PA-C [facility] Pager: [phone] 03/27/2019,12:27 PM  LOS: 4 days   I have seen and examined this patient and agree with plan and assessment in the above note with renal recommendations/intervention highlighted. Awaiting endarterectomy tomorrow per Dr. Donnetta Hutching. [place] John A Fiorela Pelzer,MD 03/27/2019 1:54 PM

## 2019-03-27 NOTE — H&P (View-Only) (Signed)
[facility] [place]  HPI: Critical limb ischemia of the bilateral lower extremities with bilateral lower extremity tissue loss.  Current hospital admission prompted by left fifth metatarsal osteomyelitis and open left foot wound.   Objective (!) 148/70 77 (!) 97.1 F (36.2 C) (Oral) (!) 21 99%  Intake/Output Summary (Last 24 hours) at 03/27/2019 0749 Last data filed at 03/26/2019 2220 Gross per 24 hour  Intake 640 ml  Output 1500 ml  Net -860 ml   Left lateral foot wound at 5th metatarsal.    +--------------+---------------+--------------------+--------------+-----------+  RT Diameter    RT Findings          GSV          LT Diameter  LT Findings      (cm)                                             (cm)                 +--------------+---------------+--------------------+--------------+-----------+                                 Saphenofemoral        0.75                                                    Junction                                +--------------+---------------+--------------------+--------------+-----------+               branching /0.15   Proximal thigh        0.38      branching                   cm branch                                                 +--------------+---------------+--------------------+--------------+-----------+                  branching        Mid thigh           0.30      branching  +--------------+---------------+--------------------+--------------+-----------+                                  Distal thigh         0.27                 +--------------+---------------+--------------------+--------------+-----------+      0.16                            Knee             0.35                 +--------------+---------------+--------------------+--------------+-----------+  0.22                         Prox calf           0.32      branching   +--------------+---------------+--------------------+--------------+-----------+      0.22        branching         Mid calf           0.23      branching  +--------------+---------------+--------------------+--------------+-----------+      0.27                        Distal calf          0.22                 +--------------+---------------+--------------------+--------------+-----------+  Assessment/Planning:  PAD with B LE tissue loss B SFA occlusions.   POD # 2 Aortogram  Left lower extremity arteriogram shows at least 70% stenosis in the left common femoral artery with heavily calcified plaque.  Patient has a flush SFA occlusion on the left with profunda runoff.  Her SFA is occluded throughout its proximal mid and distal segment.  She does reconstitute a heavily diseased above-knee popliteal artery.  The below-knee popliteal artery looks much healthier although heavily calcified.  Patient's dominant runoff is via the anterior tibial but does appear to have at least three-vessel runoff.  There is significant small vessel disease in the left foot.  Plan: Patient will be scheduled for left common femoral endarterectomy with left femoral to below-knee popliteal bypass on Monday with Dr. Donnetta Hutching.  He Left LE appears to have acceptable GSV for bypass with a narrowed section at the mid calf.    NPO past MN   Roxy Horseman 03/27/2019 7:49 AM --  Laboratory Lab Results: Recent Labs    03/25/19 0500 03/26/19 2013  WBC 8.2 10.8*  HGB 11.6* 10.3*  HCT 36.3 31.0*  PLT 252 266   BMET Recent Labs    03/25/19 1000 03/26/19 2013  NA 135 133*  K 5.6* 3.9  CL 96* 95*  CO2 21* 25  GLUCOSE 172* 151*  BUN 71* 48*  CREATININE 4.76* 3.52*  CALCIUM 8.2* 7.7*    COAG Lab Results  Component Value Date   INR 1.2 03/23/2019   INR 1.17 11/30/2017   INR 1.24 11/26/2017   No results found for: PTT

## 2019-03-28 ENCOUNTER — Encounter (HOSPITAL_COMMUNITY)
Admission: EM | Disposition: A | Discharge status: Rehabilitation Facility | Payer: Self-pay | Source: Home / Self Care | Attending: Internal Medicine

## 2019-03-28 ENCOUNTER — Inpatient Hospital Stay (HOSPITAL_COMMUNITY): Payer: Medicare Other | Admitting: Anesthesiology

## 2019-03-28 DIAGNOSIS — I70245 Atherosclerosis of native arteries of left leg with ulceration of other part of foot: Secondary | ICD-10-CM

## 2019-03-28 HISTORY — PX: PATCH ANGIOPLASTY: SHX6230

## 2019-03-28 HISTORY — PX: ENDARTERECTOMY FEMORAL: SHX5804

## 2019-03-28 HISTORY — PX: FEMORAL-POPLITEAL BYPASS GRAFT: SHX937

## 2019-03-28 LAB — CBC
HCT: 39 % (ref 36.0–46.0)
Hemoglobin: 12.7 g/dL (ref 12.0–15.0)
MCH: 30.8 pg (ref 26.0–34.0)
MCHC: 32.6 g/dL (ref 30.0–36.0)
MCV: 94.4 fL (ref 80.0–100.0)
Platelets: 298 10*3/uL (ref 150–400)
RBC: 4.13 MIL/uL (ref 3.87–5.11)
RDW: 15.9 % — ABNORMAL HIGH (ref 11.5–15.5)
WBC: 8.5 10*3/uL (ref 4.0–10.5)
nRBC: 0 % (ref 0.0–0.2)

## 2019-03-28 LAB — BASIC METABOLIC PANEL
Anion gap: 17 — ABNORMAL HIGH (ref 5–15)
BUN: 41 mg/dL — ABNORMAL HIGH (ref 8–23)
CO2: 26 mmol/L (ref 22–32)
Calcium: 8 mg/dL — ABNORMAL LOW (ref 8.9–10.3)
Chloride: 94 mmol/L — ABNORMAL LOW (ref 98–111)
Creatinine, Ser: 3.56 mg/dL — ABNORMAL HIGH (ref 0.44–1.00)
GFR calc Af Amer: 14 mL/min — ABNORMAL LOW (ref >60)
GFR calc non Af Amer: 12 mL/min — ABNORMAL LOW (ref >60)
Glucose, Bld: 180 mg/dL — ABNORMAL HIGH (ref 70–99)
Potassium: 3.4 mmol/L — ABNORMAL LOW (ref 3.5–5.1)
Sodium: 137 mmol/L (ref 135–145)

## 2019-03-28 LAB — TYPE AND SCREEN
ABO/RH(D): B POS
Antibody Screen: NEGATIVE

## 2019-03-28 LAB — CULTURE, BLOOD (ROUTINE X 2)
Culture: NO GROWTH
Culture: NO GROWTH

## 2019-03-28 LAB — GLUCOSE, CAPILLARY
Glucose-Capillary: 152 mg/dL — ABNORMAL HIGH (ref 70–99)
Glucose-Capillary: 195 mg/dL — ABNORMAL HIGH (ref 70–99)
Glucose-Capillary: 191 mg/dL — ABNORMAL HIGH (ref 70–99)
Glucose-Capillary: 186 mg/dL — ABNORMAL HIGH (ref 70–99)
Glucose-Capillary: 167 mg/dL — ABNORMAL HIGH (ref 70–99)

## 2019-03-28 LAB — PROTIME-INR
INR: 1.1 (ref 0.8–1.2)
Prothrombin Time: 14 seconds (ref 11.4–15.2)

## 2019-03-28 SURGERY — ENDARTERECTOMY, FEMORAL
Anesthesia: General | Site: Leg Lower | Laterality: Left

## 2019-03-28 MED ORDER — LIDOCAINE 2% (20 MG/ML) 5 ML SYRINGE
INTRAMUSCULAR | Status: AC
  Filled 2019-03-28: qty 5

## 2019-03-28 MED ORDER — ACETAMINOPHEN 325 MG PO TABS
325.0000 mg | ORAL_TABLET | ORAL | Status: DC | PRN
  Filled 2019-03-28: qty 2

## 2019-03-28 MED ORDER — ONDANSETRON HCL 4 MG/2ML IJ SOLN
INTRAMUSCULAR | Status: DC | PRN
  Administered 2019-03-28: 4 mg via INTRAVENOUS

## 2019-03-28 MED ORDER — VASOPRESSIN 20 UNIT/ML IV SOLN
INTRAVENOUS | Status: DC | PRN
  Administered 2019-03-28 (×2): 1 m[IU] via INTRAVENOUS
  Administered 2019-03-28 (×2): .5 m[IU] via INTRAVENOUS

## 2019-03-28 MED ORDER — ALBUMIN HUMAN 5 % IV SOLN
INTRAVENOUS | Status: AC
  Filled 2019-03-28: qty 250

## 2019-03-28 MED ORDER — PROPOFOL 10 MG/ML IV BOLUS
INTRAVENOUS | Status: DC | PRN
  Administered 2019-03-28: 30 mg via INTRAVENOUS
  Administered 2019-03-28: 130 mg via INTRAVENOUS

## 2019-03-28 MED ORDER — MIDAZOLAM HCL 5 MG/5ML IJ SOLN
INTRAMUSCULAR | Status: DC | PRN
  Administered 2019-03-28: .5 mg via INTRAVENOUS

## 2019-03-28 MED ORDER — PHENYLEPHRINE HCL-NACL 10-0.9 MG/250ML-% IV SOLN
INTRAVENOUS | Status: DC | PRN
  Administered 2019-03-28: 25 ug/min via INTRAVENOUS

## 2019-03-28 MED ORDER — ROCURONIUM BROMIDE 10 MG/ML (PF) SYRINGE
PREFILLED_SYRINGE | INTRAVENOUS | Status: AC
  Filled 2019-03-28: qty 10

## 2019-03-28 MED ORDER — MAGNESIUM SULFATE 2 GM/50ML IV SOLN: 2.0000 g | Freq: Every day | INTRAVENOUS | Status: DC | PRN

## 2019-03-28 MED ORDER — FENTANYL CITRATE (PF) 100 MCG/2ML IJ SOLN
INTRAMUSCULAR | Status: DC | PRN
  Administered 2019-03-28: 50 ug via INTRAVENOUS
  Administered 2019-03-28: 25 ug via INTRAVENOUS

## 2019-03-28 MED ORDER — PROPOFOL 10 MG/ML IV BOLUS
INTRAVENOUS | Status: AC
  Filled 2019-03-28: qty 20

## 2019-03-28 MED ORDER — SODIUM CHLORIDE 0.9 % IV SOLN
INTRAVENOUS | Status: DC | PRN
  Administered 2019-03-28: 500 mL

## 2019-03-28 MED ORDER — CHLORHEXIDINE GLUCONATE CLOTH 2 % EX PADS
6.0000 | MEDICATED_PAD | Freq: Every day | CUTANEOUS | Status: DC
  Administered 2019-03-29: 6 via TOPICAL

## 2019-03-28 MED ORDER — DIPHENHYDRAMINE HCL 50 MG/ML IJ SOLN
INTRAMUSCULAR | Status: AC
  Filled 2019-03-28: qty 1

## 2019-03-28 MED ORDER — SODIUM CHLORIDE 0.9 % IV SOLN: INTRAVENOUS | Status: DC

## 2019-03-28 MED ORDER — 0.9 % SODIUM CHLORIDE (POUR BTL) OPTIME
TOPICAL | Status: DC | PRN
  Administered 2019-03-28 (×2): 1000 mL

## 2019-03-28 MED ORDER — PROTAMINE SULFATE 10 MG/ML IV SOLN
INTRAVENOUS | Status: AC
  Filled 2019-03-28: qty 5

## 2019-03-28 MED ORDER — PHENYLEPHRINE 40 MCG/ML (10ML) SYRINGE FOR IV PUSH (FOR BLOOD PRESSURE SUPPORT)
PREFILLED_SYRINGE | INTRAVENOUS | Status: AC
  Filled 2019-03-28: qty 10

## 2019-03-28 MED ORDER — SUGAMMADEX SODIUM 200 MG/2ML IV SOLN
INTRAVENOUS | Status: DC | PRN
  Administered 2019-03-28: 50 mg via INTRAVENOUS
  Administered 2019-03-28: 100 mg via INTRAVENOUS

## 2019-03-28 MED ORDER — ACETAMINOPHEN 325 MG RE SUPP: 325.0000 mg | RECTAL | Status: DC | PRN

## 2019-03-28 MED ORDER — FENTANYL CITRATE (PF) 100 MCG/2ML IJ SOLN
INTRAMUSCULAR | Status: AC
  Filled 2019-03-28: qty 2

## 2019-03-28 MED ORDER — ROCURONIUM BROMIDE 10 MG/ML (PF) SYRINGE
PREFILLED_SYRINGE | INTRAVENOUS | Status: DC | PRN
  Administered 2019-03-28 (×3): 10 mg via INTRAVENOUS
  Administered 2019-03-28: 50 mg via INTRAVENOUS
  Administered 2019-03-28 (×2): 10 mg via INTRAVENOUS

## 2019-03-28 MED ORDER — PROTAMINE SULFATE 10 MG/ML IV SOLN
INTRAVENOUS | Status: DC | PRN
  Administered 2019-03-28 (×2): 50 mg via INTRAVENOUS

## 2019-03-28 MED ORDER — PROPOFOL 500 MG/50ML IV EMUL
INTRAVENOUS | Status: DC | PRN
  Administered 2019-03-28: 80 ug/kg/min via INTRAVENOUS
  Administered 2019-03-28: 100 ug/kg/min via INTRAVENOUS
  Administered 2019-03-28: 75 ug/kg/min via INTRAVENOUS

## 2019-03-28 MED ORDER — ASPIRIN EC 81 MG PO TBEC
81.0000 mg | DELAYED_RELEASE_TABLET | Freq: Every day | ORAL | Status: DC
  Administered 2019-03-30 – 2019-04-06 (×8): 81 mg via ORAL
  Filled 2019-03-28 (×8): qty 1

## 2019-03-28 MED ORDER — PHENYLEPHRINE 40 MCG/ML (10ML) SYRINGE FOR IV PUSH (FOR BLOOD PRESSURE SUPPORT)
PREFILLED_SYRINGE | INTRAVENOUS | Status: DC | PRN
  Administered 2019-03-28 (×2): 80 ug via INTRAVENOUS

## 2019-03-28 MED ORDER — HYDROMORPHONE HCL 1 MG/ML IJ SOLN
0.5000 mg | INTRAMUSCULAR | Status: DC | PRN
  Administered 2019-03-28 – 2019-03-31 (×4): 0.5 mg via INTRAVENOUS
  Filled 2019-03-28 (×4): qty 1

## 2019-03-28 MED ORDER — FENTANYL CITRATE (PF) 250 MCG/5ML IJ SOLN
INTRAMUSCULAR | Status: AC
  Filled 2019-03-28: qty 5

## 2019-03-28 MED ORDER — VASOPRESSIN 20 UNIT/ML IV SOLN
INTRAVENOUS | Status: AC
  Filled 2019-03-28: qty 1

## 2019-03-28 MED ORDER — ACETAMINOPHEN 500 MG PO TABS
1000.0000 mg | ORAL_TABLET | Freq: Once | ORAL | Status: AC
  Administered 2019-03-28: 1000 mg via ORAL
  Filled 2019-03-28: qty 2

## 2019-03-28 MED ORDER — ONDANSETRON HCL 4 MG/2ML IJ SOLN: 4.0000 mg | Freq: Four times a day (QID) | INTRAMUSCULAR | Status: DC | PRN

## 2019-03-28 MED ORDER — PHENOL 1.4 % MT LIQD: 1.0000 | OROMUCOSAL | Status: DC | PRN

## 2019-03-28 MED ORDER — ATORVASTATIN CALCIUM 10 MG PO TABS
10.0000 mg | ORAL_TABLET | Freq: Every day | ORAL | Status: DC
  Administered 2019-03-29 – 2019-04-06 (×9): 10 mg via ORAL
  Filled 2019-03-28 (×9): qty 1

## 2019-03-28 MED ORDER — POTASSIUM CHLORIDE CRYS ER 20 MEQ PO TBCR: 20.0000 meq | EXTENDED_RELEASE_TABLET | Freq: Every day | ORAL | Status: DC | PRN

## 2019-03-28 MED ORDER — SODIUM CHLORIDE 0.9 % IV SOLN: 500.0000 mL | Freq: Once | INTRAVENOUS | Status: DC | PRN

## 2019-03-28 MED ORDER — LIDOCAINE 2% (20 MG/ML) 5 ML SYRINGE
INTRAMUSCULAR | Status: DC | PRN
  Administered 2019-03-28: 60 mg via INTRAVENOUS

## 2019-03-28 MED ORDER — MIDAZOLAM HCL 2 MG/2ML IJ SOLN
INTRAMUSCULAR | Status: AC
  Filled 2019-03-28: qty 2

## 2019-03-28 MED ORDER — SODIUM CHLORIDE 0.9 % IV SOLN
INTRAVENOUS | Status: AC
  Filled 2019-03-28: qty 1.2

## 2019-03-28 MED ORDER — DIPHENHYDRAMINE HCL 50 MG/ML IJ SOLN
INTRAMUSCULAR | Status: DC | PRN
  Administered 2019-03-28: 6.25 mg via INTRAVENOUS

## 2019-03-28 MED ORDER — ALBUMIN HUMAN 5 % IV SOLN
12.5000 g | Freq: Once | INTRAVENOUS | Status: AC
  Administered 2019-03-28: 12.5 g via INTRAVENOUS

## 2019-03-28 MED ORDER — EPHEDRINE 5 MG/ML INJ
INTRAVENOUS | Status: AC
  Filled 2019-03-28: qty 10

## 2019-03-28 MED ORDER — ONDANSETRON HCL 4 MG/2ML IJ SOLN: 4.0000 mg | Freq: Once | INTRAMUSCULAR | Status: DC | PRN

## 2019-03-28 MED ORDER — EPHEDRINE SULFATE-NACL 50-0.9 MG/10ML-% IV SOSY
PREFILLED_SYRINGE | INTRAVENOUS | Status: DC | PRN
  Administered 2019-03-28 (×3): 10 mg via INTRAVENOUS

## 2019-03-28 MED ORDER — FENTANYL CITRATE (PF) 100 MCG/2ML IJ SOLN
25.0000 ug | INTRAMUSCULAR | Status: DC | PRN
  Administered 2019-03-28 (×2): 25 ug via INTRAVENOUS

## 2019-03-28 MED ORDER — PANTOPRAZOLE SODIUM 40 MG PO TBEC
40.0000 mg | DELAYED_RELEASE_TABLET | Freq: Every day | ORAL | Status: DC
  Administered 2019-03-31 – 2019-04-06 (×5): 40 mg via ORAL
  Filled 2019-03-28 (×9): qty 1

## 2019-03-28 MED ORDER — PROPOFOL 1000 MG/100ML IV EMUL
INTRAVENOUS | Status: AC
  Filled 2019-03-28: qty 100

## 2019-03-28 SURGICAL SUPPLY — 57 items
ADH SKN CLS APL DERMABOND .7 (GAUZE/BANDAGES/DRESSINGS) ×2
BANDAGE ESMARK 6X9 LF (GAUZE/BANDAGES/DRESSINGS) IMPLANT
BNDG CMPR 9X6 STRL LF SNTH (GAUZE/BANDAGES/DRESSINGS) ×2
BNDG CMPR MED 10X6 ELC LF (GAUZE/BANDAGES/DRESSINGS) ×2
BNDG ELASTIC 4X5.8 VLCR STR LF (GAUZE/BANDAGES/DRESSINGS) ×2 IMPLANT
BNDG ELASTIC 6X10 VLCR STRL LF (GAUZE/BANDAGES/DRESSINGS) ×2 IMPLANT
BNDG ESMARK 6X9 LF (GAUZE/BANDAGES/DRESSINGS) ×4
BNDG GAUZE ELAST 4 BULKY (GAUZE/BANDAGES/DRESSINGS) ×2 IMPLANT
CANISTER SUCT 3000ML PPV (MISCELLANEOUS) ×4 IMPLANT
CANNULA VESSEL 3MM 2 BLNT TIP (CANNULA) ×8 IMPLANT
CATH EMB 4FR 80CM (CATHETERS) ×2 IMPLANT
CATH FOLEY LATEX FREE 16FR (CATHETERS) ×4
CATH FOLEY LF 16FR (CATHETERS) IMPLANT
CLIP LIGATING EXTRA MED SLVR (CLIP) ×4 IMPLANT
CLIP LIGATING EXTRA SM BLUE (MISCELLANEOUS) ×4 IMPLANT
CUFF TOURN SGL QUICK 24 (TOURNIQUET CUFF) ×4
CUFF TRNQT CYL 24X4X16.5-23 (TOURNIQUET CUFF) IMPLANT
DERMABOND ADVANCED (GAUZE/BANDAGES/DRESSINGS) ×2
DERMABOND ADVANCED .7 DNX12 (GAUZE/BANDAGES/DRESSINGS) ×2 IMPLANT
DRAIN SNY 10X20 3/4 PERF (WOUND CARE) IMPLANT
ELECT REM PT RETURN 9FT ADLT (ELECTROSURGICAL) ×4
ELECTRODE REM PT RTRN 9FT ADLT (ELECTROSURGICAL) ×2 IMPLANT
EVACUATOR SILICONE 100CC (DRAIN) IMPLANT
GLOVE BIOGEL PI IND STRL 6.5 (GLOVE) IMPLANT
GLOVE BIOGEL PI IND STRL 7.0 (GLOVE) IMPLANT
GLOVE BIOGEL PI INDICATOR 6.5 (GLOVE) ×4
GLOVE BIOGEL PI INDICATOR 7.0 (GLOVE) ×2
GLOVE SURG SS PI 6.5 STRL IVOR (GLOVE) ×2 IMPLANT
GLOVE SURG SS PI 7.5 STRL IVOR (GLOVE) ×6 IMPLANT
GOWN STRL REUS W/ TWL LRG LVL3 (GOWN DISPOSABLE) ×6 IMPLANT
GOWN STRL REUS W/ TWL XL LVL3 (GOWN DISPOSABLE) IMPLANT
GOWN STRL REUS W/TWL LRG LVL3 (GOWN DISPOSABLE) ×8
GOWN STRL REUS W/TWL XL LVL3 (GOWN DISPOSABLE) ×10 IMPLANT
INSERT FOGARTY SM (MISCELLANEOUS) IMPLANT
KIT BASIN OR (CUSTOM PROCEDURE TRAY) ×4 IMPLANT
KIT TURNOVER KIT B (KITS) ×4 IMPLANT
NS IRRIG 1000ML POUR BTL (IV SOLUTION) ×8 IMPLANT
PACK PERIPHERAL VASCULAR (CUSTOM PROCEDURE TRAY) ×4 IMPLANT
PAD ARMBOARD 7.5X6 YLW CONV (MISCELLANEOUS) ×8 IMPLANT
PAD CAST 4YDX4 CTTN HI CHSV (CAST SUPPLIES) IMPLANT
PADDING CAST COTTON 4X4 STRL (CAST SUPPLIES) ×4
PADDING CAST COTTON 6X4 STRL (CAST SUPPLIES) IMPLANT
PATCH HEMASHIELD 8X75 (Vascular Products) ×2 IMPLANT
SPONGE LAP 18X18 RF (DISPOSABLE) ×6 IMPLANT
STOPCOCK 4 WAY LG BORE MALE ST (IV SETS) ×2 IMPLANT
SUT ETHILON 3 0 PS 1 (SUTURE) IMPLANT
SUT PROLENE 5 0 C 1 24 (SUTURE) ×6 IMPLANT
SUT PROLENE 6 0 CC (SUTURE) ×22 IMPLANT
SUT SILK 2 0 SH (SUTURE) ×4 IMPLANT
SUT VIC AB 2-0 CTX 36 (SUTURE) ×8 IMPLANT
SUT VIC AB 3-0 SH 27 (SUTURE) ×16
SUT VIC AB 3-0 SH 27X BRD (SUTURE) ×4 IMPLANT
SYR 3ML LL SCALE MARK (SYRINGE) ×2 IMPLANT
TOWEL GREEN STERILE (TOWEL DISPOSABLE) ×4 IMPLANT
TUBING EXTENTION W/L.L. (IV SETS) ×2 IMPLANT
UNDERPAD 30X30 (UNDERPADS AND DIAPERS) ×4 IMPLANT
WATER STERILE IRR 1000ML POUR (IV SOLUTION) ×4 IMPLANT

## 2019-03-28 NOTE — Interval H&P Note (Signed)
History and Physical Interval Note:  03/28/2019 8:37 AM  [name]  has presented today for surgery, with the diagnosis of OSTEOMYLITIS OF LEFT FOOT.  The various methods of treatment have been discussed with the patient and family. After consideration of risks, benefits and other options for treatment, the patient has consented to  Procedure(s): ENDARTERECTOMY FEMORAL (Left) BYPASS GRAFT FEMORAL-POPLITEAL ARTERY (Left) as a surgical intervention.  The patient's history has been reviewed, patient examined, no change in status, stable for surgery.  I have reviewed the patient's chart and labs.  Questions were answered to the patient's satisfaction.     Curt Jews

## 2019-03-28 NOTE — Progress Notes (Signed)
PROGRESS NOTE   [name]  XQJ:[id number]    DOB: [birth year]    DOA: 03/23/2019  PCP: Unk Pinto, MD   I have briefly reviewed patients previous medical records in [facility].  Chief Complaint:  Patient sent from wound care center due to infected left foot wound that failed outpatient antibiotics.  Brief Narrative:  73 year old married female, independent, PMH of CAD s/p CABG, chronic combined CHF/ICM, stage IV CKD (baseline creatinine 2.7-3.1), HTN, HLD, DM, lower extremity PAD (bilateral SFA occlusion 07/2018), chronic bilateral feet ulcerations, osteomyelitis of left toe, LBBB who has had a chronic wound on her left foot that is followed at the wound care center closely, recently noted worsening infection and started on IV antibiotics including vancomycin at dialysis (reportedly completed 3 doses) sent to [facility] from the wound care center due to low-grade fevers and increased drainage from the ulcer.  Admitted for left little toe osteomyelitis, abscess and sepsis complicating PAD.  Nephrology consulted for HD needs.  S/p aortogram with bilateral lower extremity arteriogram with runoff 12/18 followed by left CFA endarterectomy and Dacron patch angioplasty, left femoral to below-knee popliteal bypass graft on 12/21.  Orthopedics/Dr. Sharol Given plans surgical intervention after vascular procedure.   Assessment & Plan:  Active Problems:   PVD (peripheral vascular disease) ([place])   ESRD on hemodialysis ([place])   Pyogenic inflammation of bone (HCC)   Osteomyelitis of foot, left, acute (HCC)   Septic arthritis of left foot (HCC)   Cellulitis of left foot   Abscess of right foot   Left little toe infected chronic ulcer with abscess and osteomyelitis, complicating PAD and peripheral neuropathy.  Vascular surgery and orthopedics consultation appreciated.  Dr. Carlis Abbott, VVS S/p aortogram with bilateral lower extremity arteriogram with runoff 12/18.   Dr. Donnetta Hutching, VVS S/P left  CFA endarterectomy and Dacron patch angioplasty, left femoral to below-knee popliteal bypass graft on 12/21.  Dr. Sharol Given, Orthopedics will consider surgical intervention pending vascular intervention status.  Failed outpatient antibiotic treatment.  Continue empirically started IV vancomycin and Zosyn.  Wound culture OP showed Serratia.  Blood cultures x2 from 12/16: Remain negative to date after 4 days.  MRSA PCR negative.  Plans as noted above.  Chronic right foot, bilateral leg ulcers  Patient has bilateral leg ulcers, suspected arterial ulcers from PAD.  Fairly clean.  She also has chronic right foot mid plantar ulcer with some slough but no overt drainage.  Orthopedics may consider intervention pending vascular surgery clearance.  PAD  Management as per vascular surgery as noted above.  Type II DM with peripheral neuropathy  Recent A1c 6.3.  Reports occasional hypoglycemic symptoms on home regimen of 70/30 insulin.  Currently only on SSI with widely fluctuating CBGs as noted below.  Adjust SSI and monitor closely.  Will consider maintenance long-acting insulin/Levemir if persistently high CBGs.  Patient reports that she takes 70/30 at home on a sliding scale regimen.  Usually takes 10-20 units in the morning and maybe 10 units at night.  Initiated Levemir 10 units daily.  Much improved glycemic control as noted below.  Adjust insulins as needed.  Continue current regimen.  ESRD on TTS HD  Nephrology continues to follow for HD needs.  Off scheduled dialysis 11/18.  Hyperkalemia 5.6 was predialysis today.    Last HD 12/19.  Next HD planned for 12/22.  Essential hypertension  Controlled.  Continue hydralazine 25 mg 3 times daily and isosorbide dinitrate 10 mg 3 times daily.  Chronic systolic  CHF  Compensated.  Gout  No acute flare.  Continue allopurinol.  Anemia of chronic kidney disease  Stable.  Chronic back pain  All the way from the neck down to the sacrum.   See his outpatient chiropractor.  Supportive treatment.  Minimize/judicious use of opioids.   DVT prophylaxis: Subcutaneous heparin was discontinued on 12/19 due to prolonged blood oozing from abdominal injection site which happened repeatedly.  SCDs now. Code Status: Full Family Communication: None at bedside. I discussed with the patient's son on 12/18, updated care and answered questions. Disposition: To be determined pending interventions and clinical improvement, not until sometime next week.   Consultants:   Nephrology Vascular surgery Orthopedics  Procedures:    Dr. Carlis Abbott, VVS S/p aortogram with bilateral lower extremity arteriogram with runoff 12/18.   Dr. Donnetta Hutching, VVS S/P left CFA endarterectomy and Dacron patch angioplasty, left femoral to below-knee popliteal bypass graft on 12/21.  HD  Antimicrobials:   IV vancomycin and Zosyn 12/16 >   Subjective:  Patient was already in the OR for procedure this morning.  Seen this afternoon post procedure.  Drowsy from sedation but easily arousable and quite oriented.  In some post procedure pain.  No chest pain or dyspnea.  No bleeding.  Objective:   Vitals:   03/28/19 1404 03/28/19 1419 03/28/19 1437 03/28/19 1610  BP: (!) 120/52 (!) 110/49 114/60 (!) 125/53  Pulse: 86 83 80 86  Resp: 16 12 14 13   Temp:  98 F (36.7 C) (!) 97.5 F (36.4 C)   TempSrc:   Oral   SpO2: 99% 98% 98% 96%  Weight:      Height:        General exam: Pleasant elderly female, moderately built and nourished lying comfortably supine in bed without distress. Respiratory system: Clear to auscultation.  No increased work of breathing. Cardiovascular system: S1 and S2 heard, RRR.  No JVD, murmurs or pedal edema.  Telemetry personally reviewed: Sinus rhythm. Gastrointestinal system: Abdomen is nondistended, soft and nontender. No organomegaly or masses felt. Normal bowel sounds heard. Central nervous system: Drowsy but easily arousable and oriented. No  focal neurological deficits. Extremities: Symmetric 5 x 5 power. Skin: She has what appears to be an arterial ulcer on medial aspect of both calves with some slough but no obvious drainage or cellulitis features.  She has approximately 1 cm diameter right plantar midfoot ulcer without drainage, has surrounding callus formation.  She has a ulcer on the lateral aspect of the left little toe with purulent drainage and surrounding patchy mild erythema of dorsum of foot.  Unable to palpate bilateral dorsalis pedis or posterior tibials.  Dressings on all the wounds were somewhat soiled, removed dressing, decreased slough in all, no erythema or active drainage.  Left leg post procedure dressing clean and dry. Psychiatry: Judgement and insight appear normal. Mood & affect appropriate.     Data Reviewed:   I have personally reviewed following labs and imaging studies   CBC: Recent Labs  Lab 03/23/19 1355 03/25/19 0500 03/26/19 2013 03/28/19 0329  WBC 10.7* 8.2 10.8* 8.5  NEUTROABS 8.0*  --   --   --   HGB 10.8* 11.6* 10.3* 12.7  HCT 33.1* 36.3 31.0* 39.0  MCV 94.6 94.0 93.7 94.4  PLT 274 252 266 086    Basic Metabolic Panel: Recent Labs  Lab 03/24/19 1407 03/25/19 1000 03/26/19 2013 03/28/19 0329  NA 135 135 133* 137  K 4.5 5.6* 3.9 3.4*  CL 96*  96* 95* 94*  CO2 25 21* 25 26  GLUCOSE 230* 172* 151* 180*  BUN 63* 71* 48* 41*  CREATININE 4.29* 4.76* 3.52* 3.56*  CALCIUM 8.1* 8.2* 7.7* 8.0*  PHOS 4.5 5.3* 4.3  --     Liver Function Tests: Recent Labs  Lab 03/23/19 1355 03/24/19 1407 03/25/19 1000 03/26/19 2013  AST 22  --   --   --   ALT 23  --   --   --   ALKPHOS 192*  --   --   --   BILITOT 0.5  --   --   --   PROT 5.9*  --   --   --   ALBUMIN 2.8* 2.7* 2.8* 2.5*    CBG: Recent Labs  Lab 03/28/19 0555 03/28/19 1334 03/28/19 1446  GLUCAP 152* 195* 191*    Microbiology Studies:   Recent Results (from the past 240 hour(s))  Culture, blood (Routine x 2)      Status: None   Collection Time: 03/23/19  1:38 PM   Specimen: BLOOD  Result Value Ref Range Status   Specimen Description BLOOD LEFT ANTECUBITAL  Final   Special Requests   Final    BOTTLES DRAWN AEROBIC AND ANAEROBIC Blood Culture results may not be optimal due to an inadequate volume of blood received in culture bottles   Culture   Final    NO GROWTH 5 DAYS Performed at [facility], [place] [address] [place], [place] [address]    Report Status 03/28/2019 FINAL  Final  Culture, blood (Routine x 2)     Status: None   Collection Time: 03/23/19  3:38 PM   Specimen: BLOOD  Result Value Ref Range Status   Specimen Description BLOOD BLOOD LEFT FOREARM  Final   Special Requests   Final    BOTTLES DRAWN AEROBIC AND ANAEROBIC Blood Culture results may not be optimal due to an excessive volume of blood received in culture bottles   Culture   Final    NO GROWTH 5 DAYS Performed at [facility], [place] [address] [place], [place] [address]    Report Status 03/28/2019 FINAL  Final  SARS CORONAVIRUS 2 (TAT 6-24 HRS) Nasopharyngeal Nasopharyngeal Swab     Status: None   Collection Time: 03/23/19  3:57 PM   Specimen: Nasopharyngeal Swab  Result Value Ref Range Status   SARS Coronavirus 2 NEGATIVE NEGATIVE Final    Comment: (NOTE) SARS-CoV-2 target nucleic acids are NOT DETECTED. The SARS-CoV-2 RNA is generally detectable in upper and lower respiratory specimens during the acute phase of infection. Negative results do not preclude SARS-CoV-2 infection, do not rule out co-infections with other pathogens, and should not be used as the sole basis for treatment or other patient management decisions. Negative results must be combined with clinical observations, patient history, and epidemiological information. The expected result is Negative. Fact Sheet for Patients: SugarRoll.be Fact Sheet for Healthcare  Providers: https://www.woods-mathews.com/ This test is not yet approved or cleared by the [place] FDA and  has been authorized for detection and/or diagnosis of SARS-CoV-2 by FDA under an Emergency Use Authorization (EUA). This EUA will remain  in effect (meaning this test can be used) for the duration of the COVID-19 declaration under Section 56 4(b)(1) of the Act, 21 U.S.C. section 360bbb-3(b)(1), unless the authorization is terminated or revoked sooner. Performed at [facility], [place] [address], [place],  [address]   MRSA PCR Screening  Status: None   Collection Time: 03/24/19  6:10 AM   Specimen: Nasal Mucosa; Nasopharyngeal  Result Value Ref Range Status   MRSA by PCR NEGATIVE NEGATIVE Final    Comment:        The GeneXpert MRSA Assay (FDA approved for NASAL specimens only), is one component of a comprehensive MRSA colonization surveillance program. It is not intended to diagnose MRSA infection nor to guide or monitor treatment for MRSA infections. Performed at [facility], [place] [address] [place], [place] [address]      Radiology Studies:  No results found.   Scheduled Meds:   . allopurinol  100 mg Oral BID  . [START ON 03/30/2019] aspirin EC  81 mg Oral Daily  . atorvastatin  10 mg Oral q1800  . Chlorhexidine Gluconate Cloth  6 each Topical Q0600  . Chlorhexidine Gluconate Cloth  6 each Topical Q0600  . cinacalcet  30 mg Oral Q supper  . [START ON 03/29/2019] darbepoetin (ARANESP) injection - DIALYSIS  60 mcg Intravenous Q Tue-HD  . doxercalciferol  4 mcg Intravenous Q T,Th,Sa-HD  . fentaNYL      . hydrALAZINE  25 mg Oral Q8H  . insulin aspart  0-15 Units Subcutaneous TID WC  . insulin aspart  0-5 Units Subcutaneous QHS  . insulin detemir  10 Units Subcutaneous Daily  . isosorbide dinitrate  10 mg Oral TID  . pantoprazole  40 mg Oral Daily  . sevelamer carbonate  1,600 mg Oral TID WC  . traZODone  25-100 mg Oral QHS   . vancomycin variable dose per unstable renal function (pharmacist dosing)   Does not apply See admin instructions    Continuous Infusions:   . sodium chloride Stopped (03/28/19 1248)  . sodium chloride    . sodium chloride    . sodium chloride    . sodium chloride    . albumin human    . magnesium sulfate bolus IVPB    . piperacillin-tazobactam (ZOSYN)  IV Stopped (03/28/19 0303)  . [START ON 03/29/2019] vancomycin       LOS: 5 days     Vernell Leep, MD, [place], [facility]. [facility]    To contact the attending provider between 7A-7P or the covering provider during after hours 7P-7A, please log into the web site [email or web address] and access using universal [place] password for that web site. If you do not have the password, please call the hospital operator.  03/28/2019, 4:33 PM

## 2019-03-28 NOTE — Discharge Instructions (Signed)
 [facility] [place]  Discharge instructions  Lower Extremity Bypass Surgery  Please refer to the following instruction for your post-procedure care. Your surgeon or physician assistant will discuss any changes with you.  Activity  You are encouraged to walk as much as you can. You can slowly return to normal activities during the month after your surgery. Avoid strenuous activity and heavy lifting until your doctor tells you it's OK. Avoid activities such as vacuuming or swinging a golf club. Do not drive until your doctor give the OK and you are no longer taking prescription pain medications. It is also normal to have difficulty with sleep habits, eating and bowel movement after surgery. These will go away with time.  Bathing/Showering  Shower daily after you go home. Do not soak in a bathtub, hot tub, or swim until the incision heals completely.  Incision Care  Clean your incision with mild soap and water. Shower every day. Pat the area dry with a clean towel. You do not need a bandage unless otherwise instructed. Do not apply any ointments or creams to your incision. If you have open wounds you will be instructed how to care for them or a visiting nurse may be arranged for you. If you have staples or sutures along your incision they will be removed at your post-op appointment. You may have skin glue on your incision. Do not peel it off. It will come off on its own in about one week.  Wash the groin wound with soap and water daily and pat dry. (No tub bath-only shower)  Then put a dry gauze or washcloth in the groin to keep this area dry to help prevent wound infection.  Do this daily and as needed.  Do not use Vaseline or neosporin on your incisions.  Only use soap and water on your incisions and then protect and keep dry.  Diet  Resume your normal diet. There are no special food restrictions following this procedure. A low fat/ low cholesterol diet is  recommended for all patients with vascular disease. In order to heal from your surgery, it is CRITICAL to get adequate nutrition. Your body requires vitamins, minerals, and protein. Vegetables are the best source of vitamins and minerals. Vegetables also provide the perfect balance of protein. Processed food has little nutritional value, so try to avoid this.  Medications  Resume taking all your medications unless your doctor or physician assistant tells you not to. If your incision is causing pain, you may take over-the-counter pain relievers such as acetaminophen (Tylenol). If you were prescribed a stronger pain medication, please aware these medication can cause nausea and constipation. Prevent nausea by taking the medication with a snack or meal. Avoid constipation by drinking plenty of fluids and eating foods with high amount of fiber, such as fruits, vegetables, and grains. Take Colace 100 mg (an over-the-counter stool softener) twice a day as needed for constipation.  Do not take Tylenol if you are taking prescription pain medications.  Follow Up  Our office will schedule a follow up appointment 2-3 weeks following discharge.  Please call us immediately for any of the following conditions  Severe or worsening pain in your legs or feet while at rest or while walking Increase pain, redness, warmth, or drainage (pus) from your incision site(s) Fever of 101 degree or higher The swelling in your leg with the bypass suddenly worsens and becomes more painful than when you were in the hospital If you have   been instructed to feel your graft pulse then you should do so every day. If you can no longer feel this pulse, call the office immediately. Not all patients are given this instruction.  Leg swelling is common after leg bypass surgery.  The swelling should improve over a few months following surgery. To improve the swelling, you may elevate your legs above the level of your heart while you are  sitting or resting. Your surgeon or physician assistant may ask you to apply an ACE wrap or wear compression ([name]) stockings to help to reduce swelling.  Reduce your risk of vascular disease  Stop smoking. If you would like help call [facility] at 1-800-QUIT-NOW (1-800-784-8669) or [place] at 336-586-4000.  Manage your cholesterol Maintain a desired weight Control your diabetes weight Control your diabetes Keep your blood pressure down  If you have any questions, please call the office at [phone]  

## 2019-03-28 NOTE — Anesthesia Procedure Notes (Signed)
Arterial Line Insertion Start/End12/21/2020 9:40 AM, 03/28/2019 9:52 AM Performed by: Leonor Liv, CRNA, CRNA  Emergency situation Patient sedated Left, radial was placed Catheter size: 20 G Hand hygiene performed  and maximum sterile barriers used  Allen's test indicative of satisfactory collateral circulation Attempts: 1 Procedure performed without using ultrasound guided technique. Following insertion, dressing applied and Biopatch. Post procedure assessment: normal  Patient tolerated the procedure well with no immediate complications. Additional procedure comments: Placed under anesthesia, order per Dr. Gifford Shave. Marland Kitchen

## 2019-03-28 NOTE — Progress Notes (Signed)
  Day of Surgery Note    Subjective:  No complaints   Vitals:   03/28/19 1334 03/28/19 1348  BP: (!) 114/55 (!) 97/38  Pulse: 87 86  Resp: 16 12  Temp: 98.1 F (36.7 C)   SpO2: 95% 98%    Incisions:   Left groin clean with only mild hematoma.  Left leg with ace wrap in place and is dry.   Extremities:  + doppler signals left DP/PT Cardiac:  regular Lungs:  Non labored    Assessment/Plan:  This is a 73 y.o. female who is s/p   #1 left common femoral endarterectomy and Dacron patch angioplasty, #2 left femoral to below-knee popliteal bypass with nonreversed translocated great saphenous vein  -pt doing well in recovery with patent bypass with +doppler signals left DP/PT.   -will hold asa and restart Wednesday  -would hold sq heparin for now bc of increased risk of bleeding -transfer to [place] later this afternoon.  -lipitor 10mg  daily started    [facility], PA-C 03/28/2019 2:03 PM [phone]

## 2019-03-28 NOTE — Transfer of Care (Signed)
Immediate Anesthesia Transfer of Care Note  Patient: [name]  Procedure(s) Performed: ENDARTERECTOMY FEMORAL (Left Groin) BYPASS GRAFT FEMORAL-POPLITEAL ARTERY (Left Leg Lower) Patch Angioplasty (Left Groin)  Patient Location: PACU  Anesthesia Type:General  Level of Consciousness: drowsy  Airway & Oxygen Therapy: Patient Spontanous Breathing  Post-op Assessment: Report given to RN and Post -op Vital signs reviewed and stable  Post vital signs: Reviewed and stable  Last Vitals:  Vitals Value Taken Time  BP 114/55 03/28/19 1333  Temp    Pulse 87 03/28/19 1335  Resp 20 03/28/19 1335  SpO2 93 % 03/28/19 1335  Vitals shown include unvalidated device data.  Last Pain:  Vitals:   03/28/19 0739  TempSrc: Oral  PainSc: 0-No pain         Complications: No apparent anesthesia complications

## 2019-03-28 NOTE — Anesthesia Preprocedure Evaluation (Addendum)
Anesthesia Evaluation  Patient identified by MRN, date of birth, ID band Patient awake    Reviewed: Allergy & Precautions, NPO status , Patient's Chart, lab work & pertinent test results  History of Anesthesia Complications (+) PONV and history of anesthetic complications  Airway Mallampati: II  TM Distance: >3 FB Neck ROM: Full    Dental  (+) Teeth Intact, Dental Advisory Given, Caps Upper permanent bridge :   Pulmonary asthma , COPD, former smoker,    Pulmonary exam normal breath sounds clear to auscultation       Cardiovascular hypertension, (-) angina+ CAD, + Past MI, + CABG, + Peripheral Vascular Disease and +CHF  Normal cardiovascular exam+ dysrhythmias (LBBB) + Valvular Problems/Murmurs MR  Rhythm:Regular Rate:Normal  Echo 12/01/2017: Study Conclusions  - Left ventricle: Hypertrophy was noted. Systolic function was   normal. The estimated ejection fraction was in the range of 50%   to 55%. Wall motion was normal; there were no regional wall   motion abnormalities. - Aortic valve: No evidence of vegetation. - Mitral valve: Mildly calcified annulus. Mildly thickened leaflets   . There was moderate regurgitation. - Left atrium: The atrium was mildly dilated. No evidence of   thrombus in the atrial cavity or appendage. - Atrial septum: No defect or patent foramen ovale was identified. - Tricuspid valve: No evidence of vegetation. There was moderate   regurgitation. - Pulmonic valve: No evidence of vegetation.   Neuro/Psych  Headaches, negative psych ROS   GI/Hepatic negative GI ROS, Neg liver ROS,   Endo/Other  diabetes, Type 2, Insulin Dependent  Renal/GU Renal Insufficiency, ESRF and DialysisRenal disease (TTHSAT; K+ 3.4)     Musculoskeletal  (+) Arthritis , OSTEOMYLITIS OF LEFT FOOT   Abdominal   Peds  Hematology negative hematology ROS (+)   Anesthesia Other Findings Day of surgery medications  reviewed with the patient.  Reproductive/Obstetrics                            Anesthesia Physical Anesthesia Plan  ASA: III  Anesthesia Plan: General   Post-op Pain Management:    Induction: Intravenous  PONV Risk Score and Plan: 4 or greater and Dexamethasone, Ondansetron and TIVA  Airway Management Planned: Oral ETT  Additional Equipment:   Intra-op Plan:   Post-operative Plan: Extubation in OR  Informed Consent: I have reviewed the patients History and Physical, chart, labs and discussed the procedure including the risks, benefits and alternatives for the proposed anesthesia with the patient or authorized representative who has indicated his/her understanding and acceptance.     Dental advisory given  Plan Discussed with: CRNA  Anesthesia Plan Comments:        Anesthesia Quick Evaluation

## 2019-03-28 NOTE — Op Note (Signed)
OPERATIVE REPORT  DATE OF SURGERY: 03/28/2019  PATIENT: [name], 73 y.o. female MRN: [id number]  DOB: [birth year]  PRE-OPERATIVE DIAGNOSIS: Critical limb ischemia left leg with nonhealing foot wounds  POST-OPERATIVE DIAGNOSIS:  Same  PROCEDURE: #1 left common femoral endarterectomy and Dacron patch angioplasty, #2 left femoral to below-knee popliteal bypass with nonreversed translocated great saphenous vein  SURGEON:  Curt Jews, M.D.  PHYSICIAN ASSISTANT: Liana Crocker, PA-C  ANESTHESIA: General  EBL: per anesthesia record  Total I/O In: 1000 [I.V.:900; IV Piggyback:100] Out: 400 [Urine:100; Blood:300]  BLOOD ADMINISTERED: none  DRAINS: none  SPECIMEN: none  COUNTS CORRECT:  YES  PATIENT DISPOSITION:  PACU - hemodynamically stable  PROCEDURE DETAILS: Patient was taken the operating placed supine position with area of the left groin left leg prepped draped you sterile fashion. SonoSite ultrasound was used to mark the level of the saphenous vein from the groin to the mid calf. Incision was made over the common femoral artery. The artery did have a pulse but was extremely calcified. The external iliac was isolated under the inguinal ligament and encircled with blue vessel loop. The superficial femoral artery was occluded at its origin. The profundus femoris artery was encircled with blue Vesseloops for control. The saphenous vein was identified at the saphenofemoral junction and using multiple skin bridges was mobilized from the groin to the mid calf. The vein became very small with multiple branches at the mid calf but did appear to be adequate length for below-knee popliteal bypass. The below-knee popliteal artery was exposed through the same vein harvest incision on the medial approach. The artery was extensively calcified but did have a lumen. The saphenous vein tributary branches were ligated with 3-0 and 4-0 silk ties divided. The vein was ligated distally and  divided. The vein was occluded at the saphenofemoral junction. The saphenous vein was excised from the saphenofemoral junction and the saphenofemoral junction was oversewn with 2 layers of 5-0 Prolene suture. A tunnel was created from the level of the below-knee popliteal artery to the groin. Patient was given 7000 units of intravenous heparin after extrication time the external iliac and profundus femoris arteries were occluded. The common femoral artery was opened with an 11 blade and sent longitudinally with Potts scissors. The common femoral artery was endarterectomized and extended down to the profunda. The origin of the profundus femoris artery was widely patent. The plaque was tacked distally with interrupted 6-0 Prolene sutures. A Finesse Dacron patch was brought into the field and was sewn as a patch angioplasty from the common femoral extending down to the profunda. The superficial femoral artery was excised from its origin since it was chronically occluded and ligated. Next the vein was brought onto the field. The vein was of excellent caliber. The vein was spatulated and the proximal end was sewn end-to-side to the Dacron patch by opening a small longitudinal opening in the patch. This was with a running 6-0 Prolene suture. This was tested and several additional sutures were required. Next a Farrel Demark was used to lyse the vein valves right giving excellent flow through the saphenous vein bypass. The vein was brought through the prior created tunnel down to the level of the below-knee popliteal artery. Next a web roll and pneumatic tourniquet was placed in the above-knee position. The leg was elevated and exsanguinated with an Esmarch tourniquet. The below-knee popliteal artery was opened with 11 blade simultaneous Potts scissors. The vein was cut to the appropriate length and was  spatulated and sewn end-to-side to the artery with a running 6-0 Prolene suture. Prior to completion of the closure  the pneumatic tourniquet was deflated and the usual flushing maneuvers were undertaken. Anastomosis was completed and there was excellent Doppler dependent flow at the level of the ankle and the posterior tibial and dorsalis pedis. The patient was given 100 mg of protamine to reverse the heparin. Patient did have extensive ongoing oozing throughout the procedure. The wounds were closed with 3-0 Vicryl in the subcutaneous and subcuticular tissue for the vein harvest. The groin and popliteal incisions were closed with 2-0 Vicryl in the fascia and the skin was closed with 3-0 subcuticular Vicryl stitch. Sterile dressing was applied and the patient was transferred to the recovery room in stable condition   [name], M.D., [facility] 03/28/2019 1:39 PM

## 2019-03-28 NOTE — Progress Notes (Signed)
[facility] ROUNDING NOTE   Subjective:   73 year old lady with a history of CAD status post CABG chronic heart failure, end-stage renal disease Tuesday Thursday Saturday dialysis.  She also has a history of hypertension hyperlipidemia diabetes lower extremity edema peripheral artery disease status post bilateral SFA occlusion for 2020.  She is chronic bilateral foot ulceration osteomyelitis of the left toe followed by wound care has noted worsening infection and started on intravenous antibiotics including vancomycin at dialysis was sent to [facility] for further management due to low-grade fevers and drainage from foot ulcer.  Status post L aortogram revealing lower extremity runoff 03/25/2019 with plans to undergo left common femoral endarterectomy with left femoropopliteal bypass 03/28/2019 appreciate assistance from Dr. Curt Jews..  Blood pressure 123/53 pulse 73 temperature 97.7 O2 sats 96% room air  Sodium 137 potassium 3.4 chloride 94 CO2 26 BUN 41 creatinine 3.56 glucose 180 calcium 8.0  WBC 8.5 hemoglobin 12.7 platelets 298  Allopurinol 100 mg daily aspirin 81 mg daily cinacalcet 30 mg daily, darbepoetin 60 mcg every Tuesday next dose 03/29/2019, Hectorol 4 mcg 3 times weekly, hydralazine 25 mg every 8 hours, insulin sliding scale, Isordil 10 mg 3 times daily, Renvela 1.6 g with meals, trazodone 100 mg daily at bedtime,  IV cefuroxime IV Zosyn 2.25 g every 8 hours IV vancomycin   Objective:  Vital signs in last 24 hours:  Temp:  [97.7 F (36.5 C)-97.9 F (36.6 C)] 97.7 F (36.5 C) (12/21 0739) Pulse Rate:  [69-75] 72 (12/21 0739) Resp:  [19-20] 20 (12/21 0739) BP: (115-123)/(50-63) 123/53 (12/21 0739) SpO2:  [96 %-98 %] 98 % (12/21 0739) Weight:  [72.3 kg] 72.3 kg (12/21 0553)  Weight change: -1.4 kg Filed Weights   03/26/19 1910 03/26/19 2220 03/28/19 0553  Weight: 73.7 kg 72.5 kg 72.3 kg    Intake/Output: I/O last 3 completed shifts: In:  707.2 [P.O.:480; IV Piggyback:227.2] Out: 2000 [Urine:500; Other:1500]   Intake/Output this shift:  Total I/O In: 200 [I.V.:100; IV Piggyback:100] Out: 100 [Urine:50; Blood:50]  General:NAD, well appearing female, sitting in bed Heart:RRR Lungs:CTAB Abdomen:soft, NTND Extremities:no LE edema Dialysis Access: RU AVF +b    Basic Metabolic Panel: Recent Labs  Lab 03/24/19 1407 03/25/19 0500 03/25/19 1000 03/26/19 2013 03/28/19 0329  NA 135 135 135 133* 137  K 4.5 4.7 5.6* 3.9 3.4*  CL 96* 96* 96* 95* 94*  CO2 25 23 21* 25 26  GLUCOSE 230* 188* 172* 151* 180*  BUN 63* 71* 71* 48* 41*  CREATININE 4.29* 4.61* 4.76* 3.52* 3.56*  CALCIUM 8.1* 7.9* 8.2* 7.7* 8.0*  PHOS 4.5  --  5.3* 4.3  --     Liver Function Tests: Recent Labs  Lab 03/23/19 1355 03/24/19 1407 03/25/19 1000 03/26/19 2013  AST 22  --   --   --   ALT 23  --   --   --   ALKPHOS 192*  --   --   --   BILITOT 0.5  --   --   --   PROT 5.9*  --   --   --   ALBUMIN 2.8* 2.7* 2.8* 2.5*   No results for input(s): LIPASE, AMYLASE in the last 168 hours. No results for input(s): AMMONIA in the last 168 hours.  CBC: Recent Labs  Lab 03/23/19 1355 03/25/19 0500 03/26/19 2013 03/28/19 0329  WBC 10.7* 8.2 10.8* 8.5  NEUTROABS 8.0*  --   --   --   HGB 10.8*  11.6* 10.3* 12.7  HCT 33.1* 36.3 31.0* 39.0  MCV 94.6 94.0 93.7 94.4  PLT 274 252 266 298    Cardiac Enzymes: No results for input(s): CKTOTAL, CKMB, CKMBINDEX, TROPONINI in the last 168 hours.  BNP: Invalid input(s): POCBNP  CBG: Recent Labs  Lab 03/27/19 0629 03/27/19 1134 03/27/19 1641 03/27/19 2125 03/28/19 0555  GLUCAP 106* 185* 206* 164* 152*    Microbiology: Results for orders placed or performed during the hospital encounter of 03/23/19  Culture, blood (Routine x 2)     Status: None   Collection Time: 03/23/19  1:38 PM   Specimen: BLOOD  Result Value Ref Range Status   Specimen Description BLOOD LEFT ANTECUBITAL  Final    Special Requests   Final    BOTTLES DRAWN AEROBIC AND ANAEROBIC Blood Culture results may not be optimal due to an inadequate volume of blood received in culture bottles   Culture   Final    NO GROWTH 5 DAYS Performed at [facility], [place] [address] [place], [place] [address]    Report Status 03/28/2019 FINAL  Final  Culture, blood (Routine x 2)     Status: None   Collection Time: 03/23/19  3:38 PM   Specimen: BLOOD  Result Value Ref Range Status   Specimen Description BLOOD BLOOD LEFT FOREARM  Final   Special Requests   Final    BOTTLES DRAWN AEROBIC AND ANAEROBIC Blood Culture results may not be optimal due to an excessive volume of blood received in culture bottles   Culture   Final    NO GROWTH 5 DAYS Performed at [facility], [place] [address] [place], [place] [address]    Report Status 03/28/2019 FINAL  Final  SARS CORONAVIRUS 2 (TAT 6-24 HRS) Nasopharyngeal Nasopharyngeal Swab     Status: None   Collection Time: 03/23/19  3:57 PM   Specimen: Nasopharyngeal Swab  Result Value Ref Range Status   SARS Coronavirus 2 NEGATIVE NEGATIVE Final    Comment: (NOTE) SARS-CoV-2 target nucleic acids are NOT DETECTED. The SARS-CoV-2 RNA is generally detectable in upper and lower respiratory specimens during the acute phase of infection. Negative results do not preclude SARS-CoV-2 infection, do not rule out co-infections with other pathogens, and should not be used as the sole basis for treatment or other patient management decisions. Negative results must be combined with clinical observations, patient history, and epidemiological information. The expected result is Negative. Fact Sheet for Patients: SugarRoll.be Fact Sheet for Healthcare Providers: https://www.woods-mathews.com/ This test is not yet approved or cleared by the [place] FDA and  has been authorized for detection and/or diagnosis of SARS-CoV-2 by FDA under an  Emergency Use Authorization (EUA). This EUA will remain  in effect (meaning this test can be used) for the duration of the COVID-19 declaration under Section 56 4(b)(1) of the Act, 21 U.S.C. section 360bbb-3(b)(1), unless the authorization is terminated or revoked sooner. Performed at [facility], [place] [address] [place], [place] [address]   MRSA PCR Screening     Status: None   Collection Time: 03/24/19  6:10 AM   Specimen: Nasal Mucosa; Nasopharyngeal  Result Value Ref Range Status   MRSA by PCR NEGATIVE NEGATIVE Final    Comment:        The GeneXpert MRSA Assay (FDA approved for NASAL specimens only), is one component of a comprehensive MRSA colonization surveillance program. It is not intended to diagnose MRSA infection nor to guide or monitor  treatment for MRSA infections. Performed at [facility], [place] [address], [place],  [address]     Coagulation Studies: Recent Labs    03/28/19 0329  LABPROT 14.0  INR 1.1    Urinalysis: No results for input(s): COLORURINE, LABSPEC, PHURINE, GLUCOSEU, HGBUR, BILIRUBINUR, KETONESUR, PROTEINUR, UROBILINOGEN, NITRITE, LEUKOCYTESUR in the last 72 hours.  Invalid input(s): APPERANCEUR    Imaging: No results found.   Medications:   . [MAR Hold] sodium chloride    . [MAR Hold] sodium chloride    . [MAR Hold] sodium chloride    . [MAR Hold] sodium chloride    . [MAR Hold] sodium chloride    . sodium chloride 10 mL/hr at 03/28/19 0834  . [MAR Hold] piperacillin-tazobactam (ZOSYN)  IV 2.25 g (03/28/19 0232)  . [MAR Hold] vancomycin     . [MAR Hold] allopurinol  100 mg Oral BID  . [MAR Hold] aspirin EC  81 mg Oral Daily  . [MAR Hold] Chlorhexidine Gluconate Cloth  6 each Topical Q0600  . [MAR Hold] cinacalcet  30 mg Oral Q supper  . [MAR Hold] darbepoetin (ARANESP) injection - DIALYSIS  60 mcg Intravenous Q Tue-HD  . [MAR Hold] doxercalciferol  4 mcg Intravenous Q T,Th,Sa-HD  . [MAR Hold] hydrALAZINE  25  mg Oral Q8H  . [MAR Hold] insulin aspart  0-15 Units Subcutaneous TID WC  . [MAR Hold] insulin aspart  0-5 Units Subcutaneous QHS  . [MAR Hold] insulin detemir  10 Units Subcutaneous Daily  . [MAR Hold] isosorbide dinitrate  10 mg Oral TID  . [MAR Hold] sevelamer carbonate  1,600 mg Oral TID WC  . [MAR Hold] sodium chloride flush  3 mL Intravenous Q12H  . [MAR Hold] traZODone  25-100 mg Oral QHS  . [MAR Hold] vancomycin variable dose per unstable renal function (pharmacist dosing)   Does not apply See admin instructions   [MAR Hold] sodium chloride, [MAR Hold] sodium chloride, [MAR Hold] sodium chloride, [MAR Hold] sodium chloride, [MAR Hold] sodium chloride, 0.9 % irrigation (POUR BTL), [MAR Hold] acetaminophen, [MAR Hold] alteplase, heparin irrigation 6000 unit, [MAR Hold] heparin, [MAR Hold] hydrALAZINE, [MAR Hold] HYDROcodone-acetaminophen, [MAR Hold] lidocaine (PF), [MAR Hold] lidocaine-prilocaine, [MAR Hold] ondansetron (ZOFRAN) IV, [MAR Hold] pentafluoroprop-tetrafluoroeth, [MAR Hold] sevelamer carbonate, [MAR Hold] sodium chloride flush  Assessment/ Plan:  1.Left foot 5th metatarsal osteo/septic arthritis- failed OP antibiotic treatment. s/p angiogram by Dr. Carlis Abbott on 12/18. Plans for L femoral endarterectomy with L femoropopliteal bypass on 12/21. Ortho/Dr. Sharol Given may have surgical intervention pending improvement after vascular procedure. On Vanc and Zosyn 2. ESRD- on HD TTS. HD yesterday tolerated well.  Ultrafiltration 1.5 L 03/26/2019 3. Anemiaof CKD-Hgb 10.3. Aranesp 60 mcg to be administered 03/29/2019 4. Secondary hyperparathyroidism- Calcium and phos in goal. Continue hectorol, sensipar and renvela 2AC 5.HTN/volume- Blood pressure well controlled. Does not appear volume overloaded. Plan for dialysis with net UF removal to dry weight.  6. Nutrition- Renal diet w/fluid restrictions.  7. DM - per primary    LOS: [place] @TODAY @11 :08 AM

## 2019-03-28 NOTE — Progress Notes (Signed)
PT arrived from PACU. C/A/Ox4. Pt sleepy. L groin level 1. Vascular aware. DP/PT dopplerable. CHG bath given. Telebox applied/ccmd notified. Vitals stable. A line has good wave form. CBG 191. Call bell within reach will continue to monitor. Jerald Kief, RN

## 2019-03-29 ENCOUNTER — Encounter: Payer: Self-pay | Admitting: *Deleted

## 2019-03-29 DIAGNOSIS — I10 Essential (primary) hypertension: Secondary | ICD-10-CM

## 2019-03-29 LAB — BASIC METABOLIC PANEL
Anion gap: 16 — ABNORMAL HIGH (ref 5–15)
BUN: 51 mg/dL — ABNORMAL HIGH (ref 8–23)
CO2: 21 mmol/L — ABNORMAL LOW (ref 22–32)
Calcium: 7 mg/dL — ABNORMAL LOW (ref 8.9–10.3)
Chloride: 97 mmol/L — ABNORMAL LOW (ref 98–111)
Creatinine, Ser: 4.21 mg/dL — ABNORMAL HIGH (ref 0.44–1.00)
GFR calc Af Amer: 11 mL/min — ABNORMAL LOW (ref >60)
GFR calc non Af Amer: 10 mL/min — ABNORMAL LOW (ref >60)
Glucose, Bld: 172 mg/dL — ABNORMAL HIGH (ref 70–99)
Potassium: 4.9 mmol/L (ref 3.5–5.1)
Sodium: 134 mmol/L — ABNORMAL LOW (ref 135–145)

## 2019-03-29 LAB — CBC
HCT: 24.7 % — ABNORMAL LOW (ref 36.0–46.0)
Hemoglobin: 8 g/dL — ABNORMAL LOW (ref 12.0–15.0)
MCH: 30.9 pg (ref 26.0–34.0)
MCHC: 32.4 g/dL (ref 30.0–36.0)
MCV: 95.4 fL (ref 80.0–100.0)
Platelets: 238 10*3/uL (ref 150–400)
RBC: 2.59 MIL/uL — ABNORMAL LOW (ref 3.87–5.11)
RDW: 15.9 % — ABNORMAL HIGH (ref 11.5–15.5)
WBC: 11.5 10*3/uL — ABNORMAL HIGH (ref 4.0–10.5)
nRBC: 0 % (ref 0.0–0.2)

## 2019-03-29 LAB — GLUCOSE, CAPILLARY
Glucose-Capillary: 146 mg/dL — ABNORMAL HIGH (ref 70–99)
Glucose-Capillary: 145 mg/dL — ABNORMAL HIGH (ref 70–99)
Glucose-Capillary: 168 mg/dL — ABNORMAL HIGH (ref 70–99)
Glucose-Capillary: 162 mg/dL — ABNORMAL HIGH (ref 70–99)

## 2019-03-29 LAB — VANCOMYCIN, RANDOM: Vancomycin Rm: 38

## 2019-03-29 MED ORDER — DARBEPOETIN ALFA 60 MCG/0.3ML IJ SOSY
PREFILLED_SYRINGE | INTRAMUSCULAR | Status: AC
  Administered 2019-03-29: 60 ug via INTRAVENOUS
  Filled 2019-03-29: qty 0.3

## 2019-03-29 MED ORDER — HEPARIN SODIUM (PORCINE) 1000 UNIT/ML DIALYSIS: 1000.0000 [IU] | INTRAMUSCULAR | Status: DC | PRN

## 2019-03-29 MED ORDER — LIDOCAINE HCL (PF) 1 % IJ SOLN: 5.0000 mL | INTRAMUSCULAR | Status: DC | PRN

## 2019-03-29 MED ORDER — VANCOMYCIN HCL IN DEXTROSE 750-5 MG/150ML-% IV SOLN
INTRAVENOUS | Status: AC
  Administered 2019-03-29: 750 mg via INTRAVENOUS
  Filled 2019-03-29: qty 150

## 2019-03-29 MED ORDER — DOXERCALCIFEROL 4 MCG/2ML IV SOLN
INTRAVENOUS | Status: AC
  Administered 2019-03-29: 4 ug via INTRAVENOUS
  Filled 2019-03-29: qty 2

## 2019-03-29 MED ORDER — CYCLOBENZAPRINE HCL 10 MG PO TABS
5.0000 mg | ORAL_TABLET | Freq: Three times a day (TID) | ORAL | Status: DC | PRN
  Administered 2019-03-29 – 2019-03-30 (×3): 5 mg via ORAL
  Filled 2019-03-29 (×3): qty 1

## 2019-03-29 MED ORDER — ALTEPLASE 2 MG IJ SOLR: 2.0000 mg | Freq: Once | INTRAMUSCULAR | Status: DC | PRN

## 2019-03-29 MED ORDER — PENTAFLUOROPROP-TETRAFLUOROETH EX AERO: 1.0000 "application " | INHALATION_SPRAY | CUTANEOUS | Status: DC | PRN

## 2019-03-29 MED ORDER — SODIUM CHLORIDE 0.9 % IV SOLN: 100.0000 mL | INTRAVENOUS | Status: DC | PRN

## 2019-03-29 MED ORDER — CAMPHOR-MENTHOL 0.5-0.5 % EX LOTN
TOPICAL_LOTION | CUTANEOUS | Status: DC | PRN
  Filled 2019-03-29: qty 222

## 2019-03-29 MED ORDER — LIDOCAINE-PRILOCAINE 2.5-2.5 % EX CREA: 1.0000 "application " | TOPICAL_CREAM | CUTANEOUS | Status: DC | PRN

## 2019-03-29 NOTE — Progress Notes (Signed)
Pharmacy Antibiotic Note  [name] is a 73 y.o. female admitted on 03/23/2019 with foot wound with imaging showing septic arthritis with osteomyelitis, surrounding inflammatory phlegmon and abscess, diffuse cellulitis and myofascitis, for Vancomycin.      Currently on #7 of antibiotics. WBC up slightly to 11.5 (last dose of steroids 12/18). Afebrile. In HD today - now back on schedule (received HD off schedule on 12/18) - last session 12/19. S/p L common femoral endarterectomy/angioplasty + L femoral to popliteal bypass 12/21.  Plan: Will continue vancomycin 750 mg IV after each HD Continue zosyn 2.25 g IV q 8 hours Monitor HD plans, cx results, clinical pic, and vanc levels as appropriate  Height: 5\' 4"  (162.6 cm) Weight: 162 lb 14.7 oz (73.9 kg) IBW/kg (Calculated) : 54.7  Temp (24hrs), Avg:98.2 F (36.8 C), Min:97.5 F (36.4 C), Max:98.7 F (37.1 C)  Recent Labs  Lab 03/23/19 1338 03/23/19 1355 03/23/19 1655 03/24/19 1407 03/25/19 0500 03/25/19 1000 03/26/19 1416 03/26/19 2013 03/28/19 0329 03/29/19 0508  WBC  --  10.7*  --   --  8.2  --   --  10.8* 8.5 11.5*  CREATININE  --  3.40*  --  4.29* 4.61* 4.76*  --  3.52* 3.56* 4.21*  LATICACIDVEN 1.1  --  1.0  --   --   --   --   --   --   --   VANCORANDOM  --   --   --  33  --   --  26  --   --   --     Estimated Creatinine Clearance: 11.7 mL/min (A) (by C-G formula based on SCr of 4.21 mg/dL (H)).    Allergies  Allergen Reactions  . Atenolol Other (See Comments) and Rash    Exacerbates gout Exacerbates gout  . Adhesive [Tape] Other (See Comments)    SKIN IS VERY SENSITIVE AND BRUISES AND TEARS EASILY; PLEASE USE AN ALTERNATIVE TO TAPE!!  . Contrast Media [Iodinated Diagnostic Agents] Rash  . Iohexol Rash and Other (See Comments)  . Latex Rash   Antonietta Jewel, PharmD, BCCCP Clinical Pharmacist  Phone: [phone]  Please check AMION for all [place] phone numbers After 10:00 PM, call [place] [phone]   03/29/2019 10:16 AM

## 2019-03-29 NOTE — Progress Notes (Signed)
PT Cancellation Note  Patient Details Name: TAIA BRAMLETT MRN: [id number] DOB: 1945-05-26   Cancelled Treatment:    Reason Eval/Treat Not Completed: Patient at procedure or test/unavailable (to HD). Will follow-up for PT evaluation as schedule permits.  Mabeline Caras, PT, DPT Acute Rehabilitation Services  Pager [phone] Office [place] 03/29/2019, 8:42 AM

## 2019-03-29 NOTE — Progress Notes (Addendum)
  Progress Note    03/29/2019 7:16 AM 1 Day Post-Op   Subjective:  Soreness at popliteal incision mostly.  Denies rest pain in foot.   Vitals:   03/29/19 0400 03/29/19 0500  BP: (!) 105/46 (!) 100/50  Pulse: 71 78  Resp: 15 17  Temp:  98.7 F (37.1 C)  SpO2: 97% 100%   Physical Exam: Lungs:  Non labored Incisions:  ACE wrap LLE, no breakthrough bleeding Extremities:  L DP and PT brisk by doppler Neurologic: A&O  CBC    Component Value Date/Time   WBC 11.5 (H) 03/29/2019 0508   RBC 2.59 (L) 03/29/2019 0508   HGB 8.0 (L) 03/29/2019 0508   HGB 11.7 05/18/2017 1218   HGB 12.3 02/02/2017 1136   HCT 24.7 (L) 03/29/2019 0508   HCT 34.7 05/18/2017 1218   HCT 37.4 02/02/2017 1136   PLT 238 03/29/2019 0508   PLT 227 05/18/2017 1218   MCV 95.4 03/29/2019 0508   MCV 87 05/18/2017 1218   MCV 90.6 02/02/2017 1136   MCH 30.9 03/29/2019 0508   MCHC 32.4 03/29/2019 0508   RDW 15.9 (H) 03/29/2019 0508   RDW 16.0 (H) 05/18/2017 1218   RDW 15.7 (H) 02/02/2017 1136   LYMPHSABS 1.5 03/23/2019 1355   LYMPHSABS 1.8 02/02/2017 1136   MONOABS 1.1 (H) 03/23/2019 1355   MONOABS 0.8 02/02/2017 1136   EOSABS 0.1 03/23/2019 1355   EOSABS 0.3 02/02/2017 1136   BASOSABS 0.0 03/23/2019 1355   BASOSABS 0.1 02/02/2017 1136    BMET    Component Value Date/Time   NA 134 (L) 03/29/2019 0508   NA 143 05/18/2017 1218   NA 143 02/02/2017 1137   K 4.9 03/29/2019 0508   K 3.8 02/02/2017 1137   CL 97 (L) 03/29/2019 0508   CO2 21 (L) 03/29/2019 0508   CO2 24 02/02/2017 1137   GLUCOSE 172 (H) 03/29/2019 0508   GLUCOSE 74 02/02/2017 1137   BUN 51 (H) 03/29/2019 0508   BUN 76 (HH) 05/18/2017 1218   BUN 63.4 (H) 02/02/2017 1137   CREATININE 4.21 (H) 03/29/2019 0508   CREATININE 3.17 (H) 01/10/2019 1525   CREATININE 3.0 (HH) 02/02/2017 1137   CALCIUM 7.0 (L) 03/29/2019 0508   CALCIUM 9.9 02/02/2017 1137   GFRNONAA 10 (L) 03/29/2019 0508   GFRNONAA 14 (L) 01/10/2019 1525   GFRAA 11 (L)  03/29/2019 0508   GFRAA 16 (L) 01/10/2019 1525    INR    Component Value Date/Time   INR 1.1 03/28/2019 0329     Intake/Output Summary (Last 24 hours) at 03/29/2019 0716 Last data filed at 03/29/2019 0600 Gross per 24 hour  Intake 1281.71 ml  Output 520 ml  Net 761.71 ml     Assessment/Plan:  73 y.o. female is s/p L CFA endarterectomy and femoral to BK pop bypass with vein 1 Day Post-Op   Patent bypass with brisk DP and PT signals by doppler Change dressing tomorrow; continue ACE today ESRD with HD again today per Nephrology L foot wound management per Dr. Etter Sjogren, PA-C [facility] [phone] 03/29/2019 7:16 AM

## 2019-03-29 NOTE — Progress Notes (Signed)
[facility] ROUNDING NOTE   Subjective:   73 year old lady with a history of CAD status post CABG chronic heart failure, end-stage renal disease Tuesday Thursday Saturday dialysis.  She also has a history of hypertension hyperlipidemia diabetes lower extremity edema peripheral artery disease status post bilateral SFA occlusion for 2020.  She is chronic bilateral foot ulceration osteomyelitis of the left toe followed by wound care has noted worsening infection and started on intravenous antibiotics including vancomycin at dialysis was sent to [facility] for further management due to low-grade fevers and drainage from foot ulcer.  Status post L aortogram revealing lower extremity runoff 03/25/2019 with plans to undergo left common femoral endarterectomy with left femoropopliteal bypass 03/28/2019 appreciate assistance from Dr. Curt Jews..  Blood pressure 100 134 pulse 85 temperature 98.7 O2 sats 100% 2 L nasal cannula  Sodium 134 potassium 4.9 chloride 97 CO2 21 BUN 31 creatinine 4.27 glucose 172 calcium 7.0 WBC 11.5 hemoglobin 8.0 platelets 238   Allopurinol 100 mg daily aspirin 81 mg daily cinacalcet 30 mg daily, darbepoetin 60 mcg every Tuesday next dose 03/29/2019, Hectorol 4 mcg 3 times weekly, hydralazine 25 mg every 8 hours, insulin sliding scale, Isordil 10 mg 3 times daily, Renvela 1.6 g with meals, trazodone 100 mg daily at bedtime,  IV cefuroxime IV Zosyn 2.25 g every 8 hours IV vancomycin   Objective:  Vital signs in last 24 hours:  Temp:  [97.5 F (36.4 C)-98.7 F (37.1 C)] 98.7 F (37.1 C) (12/22 0905) Pulse Rate:  [70-87] 75 (12/22 1045) Resp:  [8-17] 8 (12/22 1045) BP: (89-125)/(38-64) 110/54 (12/22 1045) SpO2:  [95 %-100 %] 100 % (12/22 0905) Arterial Line BP: (97-152)/(34-49) 112/36 (12/22 0400) Weight:  [73.6 kg-73.9 kg] 73.9 kg (12/22 0905)  Weight change: 1.3 kg Filed Weights   03/28/19 0553 03/29/19 0600 03/29/19 0905  Weight: 72.3 kg  73.6 kg 73.9 kg    Intake/Output: I/O last 3 completed shifts: In: 1521.7 [P.O.:360; I.V.:900; IV Piggyback:261.7] Out: 1020 [Urine:720; Blood:300]   Intake/Output this shift:  No intake/output data recorded.  General:NAD, well appearing female, sitting in bed Heart:RRR Lungs:CTAB Abdomen:soft, NTND Extremities:no LE edema Dialysis Access: RU AVF +b    Basic Metabolic Panel: Recent Labs  Lab 03/24/19 1407 03/25/19 0500 03/25/19 1000 03/26/19 2013 03/28/19 0329 03/29/19 0508  NA 135 135 135 133* 137 134*  K 4.5 4.7 5.6* 3.9 3.4* 4.9  CL 96* 96* 96* 95* 94* 97*  CO2 25 23 21* 25 26 21*  GLUCOSE 230* 188* 172* 151* 180* 172*  BUN 63* 71* 71* 48* 41* 51*  CREATININE 4.29* 4.61* 4.76* 3.52* 3.56* 4.21*  CALCIUM 8.1* 7.9* 8.2* 7.7* 8.0* 7.0*  PHOS 4.5  --  5.3* 4.3  --   --     Liver Function Tests: Recent Labs  Lab 03/23/19 1355 03/24/19 1407 03/25/19 1000 03/26/19 2013  AST 22  --   --   --   ALT 23  --   --   --   ALKPHOS 192*  --   --   --   BILITOT 0.5  --   --   --   PROT 5.9*  --   --   --   ALBUMIN 2.8* 2.7* 2.8* 2.5*   No results for input(s): LIPASE, AMYLASE in the last 168 hours. No results for input(s): AMMONIA in the last 168 hours.  CBC: Recent Labs  Lab 03/23/19 1355 03/25/19 0500 03/26/19 2013 03/28/19 4287 03/29/19 6811  WBC 10.7* 8.2 10.8* 8.5 11.5*  NEUTROABS 8.0*  --   --   --   --   HGB 10.8* 11.6* 10.3* 12.7 8.0*  HCT 33.1* 36.3 31.0* 39.0 24.7*  MCV 94.6 94.0 93.7 94.4 95.4  PLT 274 252 266 298 238    Cardiac Enzymes: No results for input(s): CKTOTAL, CKMB, CKMBINDEX, TROPONINI in the last 168 hours.  BNP: Invalid input(s): POCBNP  CBG: Recent Labs  Lab 03/28/19 1334 03/28/19 1446 03/28/19 1732 03/28/19 2117 03/29/19 0641  GLUCAP 195* 191* 186* 167* 146*    Microbiology: Results for orders placed or performed during the hospital encounter of 03/23/19  Culture, blood (Routine x 2)     Status: None   Collection  Time: 03/23/19  1:38 PM   Specimen: BLOOD  Result Value Ref Range Status   Specimen Description BLOOD LEFT ANTECUBITAL  Final   Special Requests   Final    BOTTLES DRAWN AEROBIC AND ANAEROBIC Blood Culture results may not be optimal due to an inadequate volume of blood received in culture bottles   Culture   Final    NO GROWTH 5 DAYS Performed at [facility], [place] [address] [place], [place] [address]    Report Status 03/28/2019 FINAL  Final  Culture, blood (Routine x 2)     Status: None   Collection Time: 03/23/19  3:38 PM   Specimen: BLOOD  Result Value Ref Range Status   Specimen Description BLOOD BLOOD LEFT FOREARM  Final   Special Requests   Final    BOTTLES DRAWN AEROBIC AND ANAEROBIC Blood Culture results may not be optimal due to an excessive volume of blood received in culture bottles   Culture   Final    NO GROWTH 5 DAYS Performed at [facility], [place] [address] [place], [place] [address]    Report Status 03/28/2019 FINAL  Final  SARS CORONAVIRUS 2 (TAT 6-24 HRS) Nasopharyngeal Nasopharyngeal Swab     Status: None   Collection Time: 03/23/19  3:57 PM   Specimen: Nasopharyngeal Swab  Result Value Ref Range Status   SARS Coronavirus 2 NEGATIVE NEGATIVE Final    Comment: (NOTE) SARS-CoV-2 target nucleic acids are NOT DETECTED. The SARS-CoV-2 RNA is generally detectable in upper and lower respiratory specimens during the acute phase of infection. Negative results do not preclude SARS-CoV-2 infection, do not rule out co-infections with other pathogens, and should not be used as the sole basis for treatment or other patient management decisions. Negative results must be combined with clinical observations, patient history, and epidemiological information. The expected result is Negative. Fact Sheet for Patients: SugarRoll.be Fact Sheet for Healthcare Providers: https://www.woods-mathews.com/ This test is not yet  approved or cleared by the [place] FDA and  has been authorized for detection and/or diagnosis of SARS-CoV-2 by FDA under an Emergency Use Authorization (EUA). This EUA will remain  in effect (meaning this test can be used) for the duration of the COVID-19 declaration under Section 56 4(b)(1) of the Act, 21 U.S.C. section 360bbb-3(b)(1), unless the authorization is terminated or revoked sooner. Performed at [facility], [place] [address] [place], [place] [address]   MRSA PCR Screening     Status: None   Collection Time: 03/24/19  6:10 AM   Specimen: Nasal Mucosa; Nasopharyngeal  Result Value Ref Range Status   MRSA by PCR NEGATIVE NEGATIVE Final    Comment:        The GeneXpert MRSA Assay (FDA  approved for NASAL specimens only), is one component of a comprehensive MRSA colonization surveillance program. It is not intended to diagnose MRSA infection nor to guide or monitor treatment for MRSA infections. Performed at [facility], [place] [address] [place], [place] [address]     Coagulation Studies: Recent Labs    03/28/19 0329  LABPROT 14.0  INR 1.1    Urinalysis: No results for input(s): COLORURINE, LABSPEC, PHURINE, GLUCOSEU, HGBUR, BILIRUBINUR, KETONESUR, PROTEINUR, UROBILINOGEN, NITRITE, LEUKOCYTESUR in the last 72 hours.  Invalid input(s): APPERANCEUR    Imaging: No results found.   Medications:   . sodium chloride    . sodium chloride    . sodium chloride    . sodium chloride    . magnesium sulfate bolus IVPB    . piperacillin-tazobactam (ZOSYN)  IV 2.25 g (03/29/19 0141)  . vancomycin     . allopurinol  100 mg Oral BID  . [START ON 03/30/2019] aspirin EC  81 mg Oral Daily  . atorvastatin  10 mg Oral q1800  . Chlorhexidine Gluconate Cloth  6 each Topical Q0600  . cinacalcet  30 mg Oral Q supper  . darbepoetin (ARANESP) injection - DIALYSIS  60 mcg Intravenous Q Tue-HD  . doxercalciferol  4 mcg Intravenous Q T,Th,Sa-HD  . hydrALAZINE   25 mg Oral Q8H  . insulin aspart  0-15 Units Subcutaneous TID WC  . insulin aspart  0-5 Units Subcutaneous QHS  . insulin detemir  10 Units Subcutaneous Daily  . isosorbide dinitrate  10 mg Oral TID  . pantoprazole  40 mg Oral Daily  . sevelamer carbonate  1,600 mg Oral TID WC  . traZODone  25-100 mg Oral QHS   sodium chloride, sodium chloride, sodium chloride, sodium chloride, acetaminophen **OR** acetaminophen, alteplase, heparin, hydrALAZINE, HYDROcodone-acetaminophen, HYDROmorphone (DILAUDID) injection, lidocaine (PF), lidocaine-prilocaine, magnesium sulfate bolus IVPB, ondansetron, pentafluoroprop-tetrafluoroeth, phenol, potassium chloride, sevelamer carbonate  Assessment/ Plan:  1.Left foot 5th metatarsal osteo/septic arthritis- failed OP antibiotic treatment. s/p angiogram by Dr. Carlis Abbott on 12/18. Status post left femoral endarterectomy with left femoropopliteal bypass 01/26/2019. Ortho/Dr. Sharol Given may have surgical intervention pending improvement after vascular procedure. On Vanc and Zosyn 2. ESRD- on HD TTS. HD yesterday tolerated well.  Ultrafiltration 1.5 L 03/26/2019.  Patient seems to be tolerating dialysis well 03/29/2019 3. Anemiaof CKD-Hgb 10.3. Aranesp 60 mcg to be administered 03/29/2019 4. Secondary hyperparathyroidism- Calcium and phos in goal. Continue hectorol, sensipar and renvela 2AC 5.HTN/volume- Blood pressure well controlled. Does not appear volume overloaded. Plan for dialysis with net UF removal to dry weight.  6. Nutrition- Renal diet w/fluid restrictions.  7. DM - per primary    LOS: [place] @TODAY @10 :55 AM

## 2019-03-29 NOTE — Progress Notes (Signed)
PROGRESS NOTE    [name]   DTO:[id number]  DOB: Aug 24, 1945  DOA: 03/23/2019 PCP: Unk Pinto, MD   Brief Narrative:  [name] 73 year old married female, independent, PMH of CAD s/p CABG, chronic combined CHF/ICM, stage IV CKD (baseline creatinine 2.7-3.1), HTN, HLD, DM, lower extremity PAD (bilateral SFA occlusion 07/2018), chronic bilateral feet ulcerations, osteomyelitis of left toe, LBBB who has had a chronic wound on her left foot that is followed at the wound care center closely, recently noted worsening infection and started on IV antibiotics including vancomycin at dialysis (reportedly completed 3 doses) sent to [facility] from the wound care center due to low-grade fevers and increased drainage from the ulcer.  Admitted for left little toe osteomyelitis, abscess and sepsis complicating PAD.  Nephrology consulted for HD needs.  S/p aortogram with bilateral lower extremity arteriogram with runoff 12/18 followed by left CFA endarterectomy and Dacron patch angioplasty, left femoral to below-knee popliteal bypass graft on 12/21.  Orthopedics/Dr. Sharol Given plans surgical intervention after vascular procedure.    Subjective: Patient has no complaints.    Assessment & Plan:   Principal Problem:   Osteomyelitis, cellulitis and abscess of foot, left, acute  Peripheral artery disease -Patient has failed outpatient antibiotic treatment and has been started on vancomycin and Zosyn in the hospital -Outpatient wound culture grew out Serratia-blood cultures have been negative -12/21-patient underwent left CFA endarterectomy and Dacron patch angioplasty and left below the knee popliteal bypass graft -Awaiting decision on surgery which is to be made by orthopedic service  Active Problems: Small bilateral ulcers on feet and legs -The patient states that these have been present for many months and apparently are healing slowly -She follows as outpatient with the wound  care center    Essential hypertension -Continue current medications which include hydralazine and isosorbide dinitrate    Diabetes mellitus type 2, insulin dependent  -Last A1c was 6.3-at home she takes 70/30 insulin Continue Levemir and sliding scale insulin in the hospital    ESRD on hemodialysis  -Receives dialysis on Tuesday Thursday Saturday and received at this morning  Chronic systolic heart failure -Fluid management is with dialysis  Gout -Continue allopurinol  Anemia of chronic disease -Stable   Time spent in minutes: 35 DVT prophylaxis: SCDs Code Status: Full code Family Communication: None at bedside Disposition Plan: -continue to treat infection Consultants:   Vascular surgery  Orthopedic surgery  Nephrology Procedures:   Dr. Carlis Abbott, [facility] S/p aortogram with bilateral lower extremity arteriogram with runoff 12/18.   Dr. Donnetta Hutching, [facility] S/P left CFA endarterectomy and Dacron patch angioplasty, left femoral to below-knee popliteal bypass graft on 12/21.  HD Antimicrobials:  Anti-infectives (From admission, onward)   Start     Dose/Rate Route Frequency Ordered Stop   03/29/19 1200  vancomycin (VANCOCIN) IVPB 750 mg/150 ml premix     750 mg 150 mL/hr over 60 Minutes Intravenous Every T-Th-Sa (Hemodialysis) 03/26/19 2303     03/28/19 0600  cefUROXime (ZINACEF) 1.5 g in sodium chloride 0.9 % 100 mL IVPB     1.5 g 200 mL/hr over 30 Minutes Intravenous On call to O.R. 03/27/19 1132 03/28/19 1453   03/26/19 2359  vancomycin (VANCOREADY) IVPB 500 mg/100 mL     500 mg 100 mL/hr over 60 Minutes Intravenous  Once 03/26/19 2303 03/27/19 0716   03/25/19 1730  vancomycin (VANCOCIN) IVPB 750 mg/150 ml premix     750 mg 150 mL/hr over 60 Minutes Intravenous  Once 03/25/19 1513  03/25/19 1627   03/24/19 1800  piperacillin-tazobactam (ZOSYN) IVPB 2.25 g     2.25 g 100 mL/hr over 30 Minutes Intravenous Every 8 hours 03/24/19 1631     03/24/19 0000  vancomycin variable dose  per unstable renal function (pharmacist dosing)  Status:  Discontinued      Does not apply See admin instructions 03/23/19 1528 03/29/19 0720   03/23/19 1530  vancomycin (VANCOREADY) IVPB 1500 mg/300 mL     1,500 mg 150 mL/hr over 120 Minutes Intravenous  Once 03/23/19 1524 03/23/19 1949   03/23/19 1515  piperacillin-tazobactam (ZOSYN) IVPB 3.375 g     3.375 g 100 mL/hr over 30 Minutes Intravenous  Once 03/23/19 1504 03/23/19 1556       Objective: Vitals:   03/29/19 1230 03/29/19 1245 03/29/19 1300 03/29/19 1305  BP: 120/62 (!) 125/58 122/64 128/62  Pulse: 80 81 82 79  Resp: 12 19 18 18   Temp:    97.9 F (36.6 C)  TempSrc:    Oral  SpO2:      Weight:      Height:        Intake/Output Summary (Last 24 hours) at 03/29/2019 1406 Last data filed at 03/29/2019 1305 Gross per 24 hour  Intake 281.71 ml  Output 2120 ml  Net -1838.29 ml   Filed Weights   03/28/19 0553 03/29/19 0600 03/29/19 0905  Weight: 72.3 kg 73.6 kg 73.9 kg    Examination: General exam: Appears comfortable  HEENT: PERRLA, oral mucosa moist, no sclera icterus or thrush Respiratory system: Clear to auscultation. Respiratory effort normal. Cardiovascular system: S1 & S2 heard, RRR.   Gastrointestinal system: Abdomen soft, non-tender, nondistended. Normal bowel sounds. Central nervous system: Alert and oriented. No focal neurological deficits. Extremities: No cyanosis, clubbing or edema Skin: Ulcers noted on bilateral feet and on the right shin with small amount of drainage noted on dressings. Psychiatry:  Mood & affect appropriate.     Data Reviewed: I have personally reviewed following labs and imaging studies  CBC: Recent Labs  Lab 03/23/19 1355 03/25/19 0500 03/26/19 2013 03/28/19 0329 03/29/19 0508  WBC 10.7* 8.2 10.8* 8.5 11.5*  NEUTROABS 8.0*  --   --   --   --   HGB 10.8* 11.6* 10.3* 12.7 8.0*  HCT 33.1* 36.3 31.0* 39.0 24.7*  MCV 94.6 94.0 93.7 94.4 95.4  PLT 274 252 266 298 376    Basic Metabolic Panel: Recent Labs  Lab 03/24/19 1407 03/25/19 0500 03/25/19 1000 03/26/19 2013 03/28/19 0329 03/29/19 0508  NA 135 135 135 133* 137 134*  K 4.5 4.7 5.6* 3.9 3.4* 4.9  CL 96* 96* 96* 95* 94* 97*  CO2 25 23 21* 25 26 21*  GLUCOSE 230* 188* 172* 151* 180* 172*  BUN 63* 71* 71* 48* 41* 51*  CREATININE 4.29* 4.61* 4.76* 3.52* 3.56* 4.21*  CALCIUM 8.1* 7.9* 8.2* 7.7* 8.0* 7.0*  PHOS 4.5  --  5.3* 4.3  --   --    GFR: Estimated Creatinine Clearance: 11.7 mL/min (A) (by C-G formula based on SCr of 4.21 mg/dL (H)). Liver Function Tests: Recent Labs  Lab 03/23/19 1355 03/24/19 1407 03/25/19 1000 03/26/19 2013  AST 22  --   --   --   ALT 23  --   --   --   ALKPHOS 192*  --   --   --   BILITOT 0.5  --   --   --   PROT 5.9*  --   --   --  ALBUMIN 2.8* 2.7* 2.8* 2.5*   No results for input(s): LIPASE, AMYLASE in the last 168 hours. No results for input(s): AMMONIA in the last 168 hours. Coagulation Profile: Recent Labs  Lab 03/23/19 1355 03/28/19 0329  INR 1.2 1.1   Cardiac Enzymes: No results for input(s): CKTOTAL, CKMB, CKMBINDEX, TROPONINI in the last 168 hours. BNP (last 3 results) No results for input(s): PROBNP in the last 8760 hours. HbA1C: No results for input(s): HGBA1C in the last 72 hours. CBG: Recent Labs  Lab 03/28/19 1334 03/28/19 1446 03/28/19 1732 03/28/19 2117 03/29/19 0641  GLUCAP 195* 191* 186* 167* 146*   Lipid Profile: No results for input(s): CHOL, HDL, LDLCALC, TRIG, CHOLHDL, LDLDIRECT in the last 72 hours. Thyroid Function Tests: No results for input(s): TSH, T4TOTAL, FREET4, T3FREE, THYROIDAB in the last 72 hours. Anemia Panel: No results for input(s): VITAMINB12, FOLATE, FERRITIN, TIBC, IRON, RETICCTPCT in the last 72 hours. Urine analysis:    Component Value Date/Time   COLORURINE YELLOW 01/10/2019 [place] 01/10/2019 1525   LABSPEC 1.019 01/10/2019 1525   PHURINE 7.5 01/10/2019 1525   GLUCOSEU  TRACE (A) 01/10/2019 1525   HGBUR NEGATIVE 01/10/2019 1525   BILIRUBINUR NEGATIVE 08/25/2018 0045   KETONESUR NEGATIVE 01/10/2019 1525   PROTEINUR 3+ (A) 01/10/2019 1525   UROBILINOGEN 0.2 05/30/2009 1500   NITRITE NEGATIVE 01/10/2019 1525   LEUKOCYTESUR NEGATIVE 01/10/2019 1525   Sepsis Labs: @LABRCNTIP (procalcitonin:4,lacticidven:4) ) Recent Results (from the past 240 hour(s))  Culture, blood (Routine x 2)     Status: None   Collection Time: 03/23/19  1:38 PM   Specimen: BLOOD  Result Value Ref Range Status   Specimen Description BLOOD LEFT ANTECUBITAL  Final   Special Requests   Final    BOTTLES DRAWN AEROBIC AND ANAEROBIC Blood Culture results may not be optimal due to an inadequate volume of blood received in culture bottles   Culture   Final    NO GROWTH 5 DAYS Performed at [facility], [place] [address], [place], [place] [address]    Report Status 03/28/2019 FINAL  Final  Culture, blood (Routine x 2)     Status: None   Collection Time: 03/23/19  3:38 PM   Specimen: BLOOD  Result Value Ref Range Status   Specimen Description BLOOD BLOOD LEFT FOREARM  Final   Special Requests   Final    BOTTLES DRAWN AEROBIC AND ANAEROBIC Blood Culture results may not be optimal due to an excessive volume of blood received in culture bottles   Culture   Final    NO GROWTH 5 DAYS Performed at [facility], [place] [address], [place], [place] [address]    Report Status 03/28/2019 FINAL  Final  SARS CORONAVIRUS 2 (TAT 6-24 HRS) Nasopharyngeal Nasopharyngeal Swab     Status: None   Collection Time: 03/23/19  3:57 PM   Specimen: Nasopharyngeal Swab  Result Value Ref Range Status   SARS Coronavirus 2 NEGATIVE NEGATIVE Final    Comment: (NOTE) SARS-CoV-2 target nucleic acids are NOT DETECTED. The SARS-CoV-2 RNA is generally detectable in upper and lower respiratory specimens during the acute phase of infection. Negative results do not preclude SARS-CoV-2 infection, do not rule out  co-infections with other pathogens, and should not be used as the sole basis for treatment or other patient management decisions. Negative results must be combined with clinical observations, patient history, and epidemiological information. The expected result is Negative. Fact Sheet for Patients: SugarRoll.be Fact Sheet  for Healthcare Providers: https://www.woods-mathews.com/ This test is not yet approved or cleared by the [place] and  has been authorized for detection and/or diagnosis of SARS-CoV-2 by FDA under an Emergency Use Authorization (EUA). This EUA will remain  in effect (meaning this test can be used) for the duration of the COVID-19 declaration under Section 56 4(b)(1) of the Act, 21 U.S.C. section 360bbb-3(b)(1), unless the authorization is terminated or revoked sooner. Performed at [facility], [place] [address] [place], [place] [address]   MRSA PCR Screening     Status: None   Collection Time: 03/24/19  6:10 AM   Specimen: Nasal Mucosa; Nasopharyngeal  Result Value Ref Range Status   MRSA by PCR NEGATIVE NEGATIVE Final    Comment:        The GeneXpert MRSA Assay (FDA approved for NASAL specimens only), is one component of a comprehensive MRSA colonization surveillance program. It is not intended to diagnose MRSA infection nor to guide or monitor treatment for MRSA infections. Performed at [facility], [place] [address] [place], [place] [address]          Radiology Studies: No results found.    Scheduled Meds: . allopurinol  100 mg Oral BID  . [START ON 03/30/2019] aspirin EC  81 mg Oral Daily  . atorvastatin  10 mg Oral q1800  . Chlorhexidine Gluconate Cloth  6 each Topical Q0600  . cinacalcet  30 mg Oral Q supper  . darbepoetin (ARANESP) injection - DIALYSIS  60 mcg Intravenous Q Tue-HD  . doxercalciferol  4 mcg Intravenous Q T,Th,Sa-HD  . hydrALAZINE  25 mg Oral Q8H  . insulin  aspart  0-15 Units Subcutaneous TID WC  . insulin aspart  0-5 Units Subcutaneous QHS  . insulin detemir  10 Units Subcutaneous Daily  . isosorbide dinitrate  10 mg Oral TID  . pantoprazole  40 mg Oral Daily  . sevelamer carbonate  1,600 mg Oral TID WC  . traZODone  25-100 mg Oral QHS   Continuous Infusions: . sodium chloride    . sodium chloride    . sodium chloride    . sodium chloride    . magnesium sulfate bolus IVPB    . piperacillin-tazobactam (ZOSYN)  IV 2.25 g (03/29/19 0141)  . vancomycin 750 mg (03/29/19 1341)     LOS: 6 days      Debbe Odea, MD [facility] Pager: [email or web address] Password [email or web address] 03/29/2019, 2:06 PM

## 2019-03-29 NOTE — Evaluation (Signed)
Physical Therapy Evaluation Patient Details Name: [name] MRN: [id number] DOB: March 01, 1946 Today's Date: 03/29/2019   History of Present Illness  Pt is a 73 y.o. female admitted 03/23/19 from wound care center due to infected L foot wound that failed outpatient antibiotics. S/p L CFA endarterectomy and femoral to BK bypass on 12/21. Potential for surgical intervention of L foot pending vascular intervention status. PMH includes CAD, CHF/ICM, CKD IV, HTN, DM, PAD (bilateral SFA occlusion 07/2018), L toe osteomyelitis.    Clinical Impression  Pt presents with an overall decrease in functional mobility secondary to above. PTA, pt mod independent with intermittent use of walking stick outdoors, lives with husband. Today, pt requiring maxA for standing mobility with RW, limited by significant pain and generalized weakness, unable to take complete steps despite assist. Educ on precautions, positioning, and importance of mobility. Pt motivated to participate and regain independent PLOF. Feel pt would benefit from intensive CIR-level therapies to maximize functional mobility and independence prior to return home.    Follow Up Recommendations CIR;Supervision for mobility/OOB    Equipment Recommendations  (TBD)    Recommendations for Other Services Rehab consult     Precautions / Restrictions Precautions Precautions: Fall Restrictions Weight Bearing Restrictions: No Other Position/Activity Restrictions: Difficulty WB through LLE due to pain      Mobility  Bed Mobility Overal bed mobility: Needs Assistance Bed Mobility: Supine to Sit     Supine to sit: Max assist     General bed mobility comments: Received sitting EOB  Transfers Overall transfer level: Needs assistance Equipment used: Rolling walker (2 wheeled) Transfers: Sit to/from Stand Sit to Stand: Max assist;From elevated surface         General transfer comment: Pt able to stand on 2nd attempt from elevated bed  height, requiring momentum and maxA for trunk elevation; minimal WB thru LLE due to pain  Ambulation/Gait Ambulation/Gait assistance: Mod assist Gait Distance (Feet): 1 Feet Assistive device: Rolling walker (2 wheeled) Gait Pattern/deviations: Step-to pattern;Decreased weight shift to left;Antalgic;Leaning posteriorly;Trunk flexed     General Gait Details: Pt unable to tolerate WB through LLE in order to take step, unable to completely offload R foot in order to take hop with RW, reliant on scooting of R foot with modA; assist to navigate RW  Stairs            Wheelchair Mobility    Modified Rankin (Stroke Patients Only)       Balance Overall balance assessment: Needs assistance Sitting-balance support: Feet supported;No upper extremity supported Sitting balance-Leahy Scale: Fair Sitting balance - Comments: cues for UE placement to maximize sitting balance Postural control: Posterior lean Standing balance support: Bilateral upper extremity supported;During functional activity Standing balance-Leahy Scale: Poor Standing balance comment: Heavy reliance on UE support; reliant on additional external assist for dynamic standing balance                             Pertinent Vitals/Pain Pain Assessment: Faces Pain Score: 7  Faces Pain Scale: Hurts whole lot Pain Location: LLE, lower back Pain Descriptors / Indicators: Spasm;Sharp;Grimacing Pain Intervention(s): Premedicated before session;Monitored during session;Repositioned    Home Living Family/patient expects to be discharged to:: Private residence Living Arrangements: Spouse/significant other Available Help at Discharge: Family;Available 24 hours/day Type of Home: House Home Access: Stairs to enter   [employer] of Steps: 1 onto porch then 1 into main house, 2 steps into kitchen Home Layout: Two level;Able  to live on main level with bedroom/bathroom Home Equipment: Gilford Rile - 2 wheels;Transport  chair;Wheelchair - manual;Grab bars - toilet Additional Comments: Husband currently recovering from THA and using walker. Son visiting from out of town to provide assist as needed    Prior Function Level of Independence: Independent with assistive device(s)         Comments: Indep with household ambulation, uses walking stick outside. Sponge bathes     Hand Dominance   Dominant Hand: Right    Extremity/Trunk Assessment   Upper Extremity Assessment Upper Extremity Assessment: Generalized weakness    Lower Extremity Assessment Lower Extremity Assessment: Generalized weakness;LLE deficits/detail LLE Deficits / Details: L knee flex limited by pain, knee ext limited by ace wrap and pain; hip flex at least 3/5 LLE: Unable to fully assess due to pain LLE Sensation: decreased light touch LLE Coordination: decreased gross motor       Communication   Communication: No difficulties  Cognition Arousal/Alertness: Awake/alert Behavior During Therapy: WFL for tasks assessed/performed Overall Cognitive Status: Within Functional Limits for tasks assessed                                        General Comments General comments (skin integrity, edema, etc.): swelling noted in L hand/UE    Exercises     Assessment/Plan    PT Assessment Patient needs continued PT services  PT Problem List Decreased strength;Decreased range of motion;Decreased activity tolerance;Decreased balance;Decreased mobility;Decreased knowledge of use of DME;Pain       PT Treatment Interventions DME instruction;Gait training;Stair training;Functional mobility training;Therapeutic activities;Therapeutic exercise;Balance training;Patient/family education    PT Goals (Current goals can be found in the Care Plan section)  Acute Rehab PT Goals Patient Stated Goal: Hopeful to d/c home, but willing to consider post-acute rehab if needed PT Goal Formulation: With patient Time For Goal Achievement:  04/12/19 Potential to Achieve Goals: Good    Frequency Min 3X/week   Barriers to discharge        Co-evaluation               AM-PAC PT "6 Clicks" Mobility  Outcome Measure Help needed turning from your back to your side while in a flat bed without using bedrails?: A Lot Help needed moving from lying on your back to sitting on the side of a flat bed without using bedrails?: A Lot Help needed moving to and from a bed to a chair (including a wheelchair)?: A Lot Help needed standing up from a chair using your arms (e.g., wheelchair or bedside chair)?: A Lot Help needed to walk in hospital room?: A Lot Help needed climbing 3-5 steps with a railing? : Total 6 Click Score: 11    End of Session Equipment Utilized During Treatment: Gait belt Activity Tolerance: Patient limited by pain Patient left: in chair;with call bell/phone within reach Nurse Communication: Mobility status PT Visit Diagnosis: Other abnormalities of gait and mobility (R26.89);Muscle weakness (generalized) (M62.81);Pain Pain - Right/Left: Left Pain - part of body: Leg    Time: 0923-3007 PT Time Calculation (min) (ACUTE ONLY): 31 min   Charges:   PT Evaluation $PT Eval Moderate Complexity: 1 Mod PT Treatments $Therapeutic Activity: 8-22 mins   Mabeline Caras, PT, DPT Acute Rehabilitation Services  Pager [phone] [place] 03/29/2019, 4:28 PM

## 2019-03-29 NOTE — Care Management Important Message (Signed)
Important Message  Patient Details  Name: DANA DEBO MRN: [id number] Date of Birth: 1945-05-21   Medicare Important Message Given:  Yes     [name] 03/29/2019, 1:44 PM

## 2019-03-29 NOTE — Procedures (Signed)
I have seen and examined this patient and agree with the plan of care.  Patient presents to be tolerating her dialysis treatment with no issues.  Status post femoropopliteal bypass 03/28/2019.  Access right upper extremity AV fistula  Sherril Croon 03/29/2019, 10:57 AM

## 2019-03-29 NOTE — Evaluation (Signed)
Occupational Therapy Evaluation Patient Details Name: [name] MRN: [id number] DOB: 09/05/1945 Today's Date: 03/29/2019    History of Present Illness Pt is a 73 y.o. female admitted 03/23/19 from wound care center due to infected L foot wound that failed outpatient antibiotics. S/p L CFA endarterectomy and femoral to BK bypass on 12/21. Potential for surgical intervention of L foot pending vascular intervention status. PMH includes CAD, CHF/ICM, CKD IV, HTN, DM, PAD (bilateral SFA occlusion 07/2018), L toe osteomyelitis.   Clinical Impression   Patient is a 73 year old female that lives with her spouse in a 2 story home, stays main level. Patient has 2 steps into her kitchen, 1 step onto porch and additional step from porch into main living area. Patient is independent with self are at baseline, uses walking stick outside, no adaptive device in the home. Currently patient requiring max assist for bed mobility and require x3 attempts to come to standing position at edge of bed with rolling walker. Patient unable to weight shift or push through walker to advance therefore sat back onto edge of bed. Patient requires max cues for body mechanics/positioning to maximize balance and safety. Due to deficits listed below recommend continued acute OT services to maximize patient safety and independence with self care.    Follow Up Recommendations  CIR;Supervision/Assistance - 24 hour    Equipment Recommendations  Other (comment)(defer to next venue)    Recommendations for Other Services Rehab consult     Precautions / Restrictions Precautions Precautions: Fall Restrictions Weight Bearing Restrictions: No      Mobility Bed Mobility Overal bed mobility: Needs Assistance Bed Mobility: Supine to Sit     Supine to sit: Max assist     General bed mobility comments: max cues for sequencing, support of L LE and trunk  Transfers Overall transfer level: Needs assistance Equipment used:  Rolling walker (2 wheeled) Transfers: Sit to/from Stand Sit to Stand: Max assist         General transfer comment: required 3 attempts before able to fully stand, max cues for body mechanics and to power up to standing    Balance Overall balance assessment: Needs assistance Sitting-balance support: Feet supported;Bilateral upper extremity supported Sitting balance-Leahy Scale: Fair Sitting balance - Comments: cues for UE placement to maximize sitting balance Postural control: Posterior lean Standing balance support: Bilateral upper extremity supported;During functional activity Standing balance-Leahy Scale: Zero Standing balance comment: maximal assistance to maintain static standing                           ADL either performed or assessed with clinical judgement   ADL Overall ADL's : Needs assistance/impaired     Grooming: Sitting;Min guard   Upper Body Bathing: Min guard;Sitting   Lower Body Bathing: Maximal assistance;Sitting/lateral leans   Upper Body Dressing : Min guard;Sitting   Lower Body Dressing: Maximal assistance;Total assistance;Sitting/lateral leans Lower Body Dressing Details (indicate cue type and reason): unable to don sock to L foot seated EOB Toilet Transfer: Maximal assistance;+2 for physical assistance;+2 for safety/equipment;BSC;RW Toilet Transfer Details (indicate cue type and reason): simulated with sit to stand, unable first two attempts, with max A able to fully extend at hips however limited balance, activity tolerance, strength. currently recommend x2 to bedside commode Toileting- Clothing Manipulation and Hygiene: Total assistance;Sitting/lateral lean       Functional mobility during ADLs: Maximal assistance General ADL Comments: due to decreased strength, activity tolerance, safety patient requiring  increased assistance with self care                  Pertinent Vitals/Pain Pain Assessment: 0-10 Pain Score: 7  Pain  Location: L LE Pain Descriptors / Indicators: Grimacing;Discomfort;Sore;Operative site guarding Pain Intervention(s): Limited activity within patient's tolerance;Monitored during session;Patient requesting pain meds-RN notified     Hand Dominance Right   Extremity/Trunk Assessment Upper Extremity Assessment Upper Extremity Assessment: Generalized weakness   Lower Extremity Assessment Lower Extremity Assessment: Defer to PT evaluation       Communication Communication Communication: No difficulties   Cognition Arousal/Alertness: Awake/alert Behavior During Therapy: WFL for tasks assessed/performed Overall Cognitive Status: Within Functional Limits for tasks assessed                                     General Comments  swelling noted in L hand/UE            Home Living Family/patient expects to be discharged to:: Private residence Living Arrangements: Spouse/significant other Available Help at Discharge: Family;Available 24 hours/day Type of Home: House Home Access: Stairs to enter [employer] of Steps: 1 onto porch then 1 into main house, 2 steps into kitchen   Home Layout: Two level;Able to live on main level with bedroom/bathroom     Bathroom Shower/Tub: Occupational psychologist: Handicapped height     Home Equipment: Other (comment);Grab bars - toilet(walking stick)          Prior Functioning/Environment Level of Independence: Independent        Comments: uses a walking stick outdoors, no AD inside. sponge bathes        OT Problem List: Decreased strength;Decreased activity tolerance;Impaired balance (sitting and/or standing);Decreased safety awareness;Decreased knowledge of use of DME or AE;Pain      OT Treatment/Interventions: Self-care/ADL training;Therapeutic exercise;DME and/or AE instruction;Therapeutic activities;Patient/family education;Balance training    OT Goals(Current goals can be found in the care  plan section) Acute Rehab OT Goals Patient Stated Goal: to get stronger OT Goal Formulation: With patient Time For Goal Achievement: 04/12/19 Potential to Achieve Goals: Good  OT Frequency: Min 2X/week    AM-PAC OT "6 Clicks" Daily Activity     Outcome Measure Help from another person eating meals?: None Help from another person taking care of personal grooming?: A Little Help from another person toileting, which includes using toliet, bedpan, or urinal?: Total Help from another person bathing (including washing, rinsing, drying)?: A Lot Help from another person to put on and taking off regular upper body clothing?: A Little Help from another person to put on and taking off regular lower body clothing?: Total 6 Click Score: 14   End of Session Equipment Utilized During Treatment: Gait belt;Rolling walker Nurse Communication: Mobility status;Patient requests pain meds  Activity Tolerance: Patient tolerated treatment well Patient left: in bed;with call bell/phone within reach;Other (comment)(lab tech present)  OT Visit Diagnosis: Unsteadiness on feet (R26.81);Other abnormalities of gait and mobility (R26.89);Muscle weakness (generalized) (M62.81);Pain Pain - Right/Left: Left Pain - part of body: Leg                Time: 7846-9629 OT Time Calculation (min): 33 min Charges:  OT General Charges $OT Visit: 1 Visit OT Evaluation $OT Eval Moderate Complexity: 1 Mod OT Treatments $Self Care/Home Management : 8-22 mins  Shon Millet OT OT office: [place] 03/29/2019, 3:08 PM

## 2019-03-29 NOTE — Anesthesia Postprocedure Evaluation (Signed)
Anesthesia Post Note  Patient: [name]  Procedure(s) Performed: ENDARTERECTOMY FEMORAL (Left Groin) BYPASS GRAFT FEMORAL-POPLITEAL ARTERY (Left Leg Lower) Patch Angioplasty (Left Groin)     Patient location during evaluation: PACU Anesthesia Type: General Level of consciousness: awake and alert Pain management: pain level controlled Vital Signs Assessment: post-procedure vital signs reviewed and stable Respiratory status: spontaneous breathing, nonlabored ventilation, respiratory function stable and patient connected to nasal cannula oxygen Cardiovascular status: blood pressure returned to baseline and stable Postop Assessment: no apparent nausea or vomiting Anesthetic complications: no    Last Vitals:  Vitals:   03/29/19 1000 03/29/19 1015  BP: (!) 125/49 (!) 117/48  Pulse: 75 74  Resp: (!) 9 15  Temp:    SpO2:      Last Pain:  Vitals:   03/29/19 0905  TempSrc: Axillary  PainSc: 0-No pain                 Catalina Gravel

## 2019-03-29 NOTE — Progress Notes (Signed)
Rehab Admissions Coordinator Note:  Patient was screened by Cleatrice Burke for appropriateness for an Inpatient Acute Rehab Consult per OT recommendations.   At this time, we are recommending Inpatient Rehab consult. I will place order. Noted further surgery planned.  Cleatrice Burke RN MSN 03/29/2019, 3:46 PM  I can be reached at [phone].

## 2019-03-29 NOTE — Progress Notes (Addendum)
Patient crying out in pain. PO pain medication given after return from HD. Pt ate lunch and appeared to be OK. Encouraged to work with PT as patient concerned that she should stay in bed because she may need another surgery. Explained importance of therapy. PT assisted patient in chair. Pt crying out that spasms were causing her unbearable pain. Ordered muscle relaxer given as well as IV pain medication. Patient continued to cry out in room. Used steady to return patient to bed and encouraged to try to relax and let medication work. Pt resting at shift change and did not want to eat dinner. Patient aware that RN will give pain medication as needed. Pt resting with call bell within reach.  Will continue to monitor.

## 2019-03-30 ENCOUNTER — Inpatient Hospital Stay (HOSPITAL_COMMUNITY): Payer: Medicare Other

## 2019-03-30 DIAGNOSIS — E119 Type 2 diabetes mellitus without complications: Secondary | ICD-10-CM

## 2019-03-30 DIAGNOSIS — M545 Low back pain: Secondary | ICD-10-CM

## 2019-03-30 DIAGNOSIS — Z48812 Encounter for surgical aftercare following surgery on the circulatory system: Secondary | ICD-10-CM

## 2019-03-30 DIAGNOSIS — I70245 Atherosclerosis of native arteries of left leg with ulceration of other part of foot: Secondary | ICD-10-CM

## 2019-03-30 DIAGNOSIS — Z794 Long term (current) use of insulin: Secondary | ICD-10-CM

## 2019-03-30 DIAGNOSIS — G8929 Other chronic pain: Secondary | ICD-10-CM

## 2019-03-30 LAB — GLUCOSE, CAPILLARY
Glucose-Capillary: 114 mg/dL — ABNORMAL HIGH (ref 70–99)
Glucose-Capillary: 156 mg/dL — ABNORMAL HIGH (ref 70–99)
Glucose-Capillary: 153 mg/dL — ABNORMAL HIGH (ref 70–99)
Glucose-Capillary: 104 mg/dL — ABNORMAL HIGH (ref 70–99)

## 2019-03-30 MED ORDER — NEPRO/CARBSTEADY PO LIQD: 237.0000 mL | Freq: Two times a day (BID) | ORAL | 0 refills | Status: DC

## 2019-03-30 MED ORDER — PIPERACILLIN-TAZOBACTAM IN DEX 2-0.25 GM/50ML IV SOLN: 2.2500 g | Freq: Three times a day (TID) | INTRAVENOUS | Status: DC

## 2019-03-30 MED ORDER — PANTOPRAZOLE SODIUM 40 MG PO TBEC: 40.0000 mg | DELAYED_RELEASE_TABLET | Freq: Every day | ORAL | Status: DC

## 2019-03-30 MED ORDER — ATORVASTATIN CALCIUM 10 MG PO TABS: 10.0000 mg | ORAL_TABLET | Freq: Every day | ORAL | Status: DC

## 2019-03-30 MED ORDER — CHLORHEXIDINE GLUCONATE CLOTH 2 % EX PADS
6.0000 | MEDICATED_PAD | Freq: Every day | CUTANEOUS | Status: DC
  Administered 2019-03-31 – 2019-04-06 (×5): 6 via TOPICAL

## 2019-03-30 MED ORDER — INSULIN ASPART 100 UNIT/ML ~~LOC~~ SOLN: 0.0000 [IU] | Freq: Three times a day (TID) | SUBCUTANEOUS | 11 refills | Status: DC

## 2019-03-30 MED ORDER — HYDROCODONE-ACETAMINOPHEN 5-325 MG PO TABS: 0.5000 | ORAL_TABLET | Freq: Four times a day (QID) | ORAL | 0 refills | Status: DC | PRN

## 2019-03-30 MED ORDER — CYCLOBENZAPRINE HCL 5 MG PO TABS: 5.0000 mg | ORAL_TABLET | Freq: Three times a day (TID) | ORAL | 0 refills | Status: DC | PRN

## 2019-03-30 MED ORDER — VANCOMYCIN HCL IN DEXTROSE 750-5 MG/150ML-% IV SOLN: 750.0000 mg | INTRAVENOUS | Status: DC

## 2019-03-30 MED ORDER — RENA-VITE PO TABS
1.0000 | ORAL_TABLET | Freq: Every day | ORAL | Status: DC
  Administered 2019-03-30 – 2019-04-06 (×8): 1 via ORAL
  Filled 2019-03-30 (×8): qty 1

## 2019-03-30 MED ORDER — NEPRO/CARBSTEADY PO LIQD
237.0000 mL | Freq: Two times a day (BID) | ORAL | Status: DC
  Administered 2019-03-31 – 2019-04-07 (×7): 237 mL via ORAL
  Filled 2019-03-30 (×2): qty 237

## 2019-03-30 MED ORDER — INSULIN DETEMIR 100 UNIT/ML ~~LOC~~ SOLN: 10.0000 [IU] | Freq: Every day | SUBCUTANEOUS | 11 refills | Status: DC

## 2019-03-30 NOTE — Progress Notes (Signed)
[facility] Progress Note   Dialysis Orders: [facility], TTS, 3.75 hours EDW 72 kg, BFR 350 with 16 g needles , DFR 800, Optiflux 180, 3K/2.25 calcium, vancomycin 750 mg q. HD, Mircera 50 mcg every 2 weeks, Hectorol 4 mcg IV 3 times weekly, heparin 5000 unit bolus.  Right radiocephalic fistula. 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 s/p angiogram by Dr. Carlis Abbott on 12/18. Status post left femoral endarterectomy with left femoropopliteal bypass 01/26/2019. Ortho/Dr. Sharol Given may have surgical intervention pending improvement after vascular procedure but not planned this admission. On Vanc and Zosyn.  2. ESRD- on HD TTS.  Net UF 2 L Tuesday with post wt not done but based on pre wt would be about 72 at EDW K 4.9 Tuesday - start on 2 K bath Thursday - normally on 3 K bath -will run at 3.5 hr due to high inpatient census 3. Anemiaof CKD-Hgb8 sig drop likely due to surgery  Aranesp 60 given Tuesday- may have some ESA resistance due to infection. - check Fe studies with HD Thursday 4. Secondary hyperparathyroidism- Corr Calcium borderline low and phos in goal. Continue hectorol, sensipar and renvela 2AC 5.HTN/volumeBP ok - at EDW 6. Nutrition- Renal diet w/fluid restrictions. Alb low- added nepro and renavite 7. DM - per primary 8. Disp - planning CIR admission - seems like a good candidate  Myriam Jacobson, PA-C [place] [phone] 03/30/2019,10:48 AM  LOS: 7 days   Subjective:   No issues with HD yesterday - feels much better today than yesterday but overall weak and a little tremulous.  Planning for CIR admission  Objective Vitals:   03/29/19 1745 03/29/19 1933 03/30/19 0427 03/30/19 0902  BP:  (!) 111/41 (!) 97/40 111/63  Pulse: 82 81 77 84  Resp: 18 12 18 15   Temp: 98.2 F (36.8 C) 97.7 F (36.5 C) 97.8 F (36.6 C) 97.6 F (36.4 C)  TempSrc: Oral Oral  Oral Oral  SpO2: 100% 96% 95% 100%  Weight:   73.4 kg   Height:       Physical Exam General: alert, conversant spirits good Heart: RRR with occ PVC Lungs: no rales Abdomen: soft NT Extremities: tr LE edema - leg incisions clear - foot not examined Dialysis Access: right lower AVF + bruit  Additional Objective Labs: Basic Metabolic Panel: Recent Labs  Lab 03/24/19 1407 03/25/19 1000 03/26/19 2013 03/28/19 0329 03/29/19 0508  NA 135 135 133* 137 134*  K 4.5 5.6* 3.9 3.4* 4.9  CL 96* 96* 95* 94* 97*  CO2 25 21* 25 26 21*  GLUCOSE 230* 172* 151* 180* 172*  BUN 63* 71* 48* 41* 51*  CREATININE 4.29* 4.76* 3.52* 3.56* 4.21*  CALCIUM 8.1* 8.2* 7.7* 8.0* 7.0*  PHOS 4.5 5.3* 4.3  --   --    Liver Function Tests: Recent Labs  Lab 03/23/19 1355 03/24/19 1407 03/25/19 1000 03/26/19 2013  AST 22  --   --   --   ALT 23  --   --   --   ALKPHOS 192*  --   --   --   BILITOT 0.5  --   --   --   PROT 5.9*  --   --   --   ALBUMIN 2.8* 2.7* 2.8* 2.5*   No results for input(s): LIPASE, AMYLASE in the last 168 hours. CBC: Recent Labs  Lab 03/23/19 1355 03/25/19 0500 03/26/19 2013 03/28/19 1025 03/29/19 8527  WBC 10.7* 8.2 10.8* 8.5 11.5*  NEUTROABS 8.0*  --   --   --   --   HGB 10.8* 11.6* 10.3* 12.7 8.0*  HCT 33.1* 36.3 31.0* 39.0 24.7*  MCV 94.6 94.0 93.7 94.4 95.4  PLT 274 252 266 298 238   Blood Culture    Component Value Date/Time   SDES BLOOD BLOOD LEFT FOREARM 03/23/2019 1538   SPECREQUEST  03/23/2019 1538    BOTTLES DRAWN AEROBIC AND ANAEROBIC Blood Culture results may not be optimal due to an excessive volume of blood received in culture bottles   CULT  03/23/2019 1538    NO GROWTH 5 DAYS Performed at [facility] [address], [place], [place] [address]    REPTSTATUS 03/28/2019 FINAL 03/23/2019 1538    Cardiac Enzymes: No results for input(s): CKTOTAL, CKMB, CKMBINDEX, TROPONINI in the last 168 hours. CBG: Recent Labs  Lab 03/29/19 0641  03/29/19 1405 03/29/19 1844 03/29/19 2106 03/30/19 0607  GLUCAP 146* 145* 168* 162* 114*   Iron Studies: No results for input(s): IRON, TIBC, TRANSFERRIN, FERRITIN in the last 72 hours. Lab Results  Component Value Date   INR 1.1 03/28/2019   INR 1.2 03/23/2019   INR 1.17 11/30/2017   Studies/Results: No results found. Medications: . sodium chloride    . sodium chloride    . sodium chloride    . sodium chloride    . magnesium sulfate bolus IVPB    . piperacillin-tazobactam (ZOSYN)  IV 2.25 g (03/30/19 0917)  . vancomycin Stopped (03/29/19 1441)   . allopurinol  100 mg Oral BID  . aspirin EC  81 mg Oral Daily  . atorvastatin  10 mg Oral q1800  . Chlorhexidine Gluconate Cloth  6 each Topical Q0600  . cinacalcet  30 mg Oral Q supper  . darbepoetin (ARANESP) injection - DIALYSIS  60 mcg Intravenous Q Tue-HD  . doxercalciferol  4 mcg Intravenous Q T,Th,Sa-HD  . hydrALAZINE  25 mg Oral Q8H  . insulin aspart  0-15 Units Subcutaneous TID WC  . insulin aspart  0-5 Units Subcutaneous QHS  . insulin detemir  10 Units Subcutaneous Daily  . isosorbide dinitrate  10 mg Oral TID  . pantoprazole  40 mg Oral Daily  . sevelamer carbonate  1,600 mg Oral TID WC  . traZODone  25-100 mg Oral QHS

## 2019-03-30 NOTE — TOC Initial Note (Signed)
Transition of Care [facility]) - Initial/Assessment Note    Patient Details  Name: [name] MRN: [id number] Date of Birth: [birth year]  Transition of Care [facility]) CM/SW Contact:    Vinie Sill, [place] Phone Number: 03/30/2019, 4:55 PM  Clinical Narrative:                  CSW visit with the patient at bedside. CSW discussed  ST rehab at [facility]. Patient states she is agreeable to discharge plan. Patient states she has never been to SNF before but her preference is [facility]. Patient was agreeable to sending out SNF referrals to surrounding SNFs. CXW explained the SNF process and limited SNF choices because of dialysis needs. Patient states dialysis T,TH, Sat at 12pm at [facility]Patient states she understands. Patient states no further questions. CSW will provide patient with Medicare.gov listing with ratings. Patient states no further questions at this time.  CSW sent referral and contacted [facility] - waiting on response.    CSW will continue to follow and assist with discharge planning.  Thurmond Butts, MSW, [place] Clinical Social Worker         Expected Discharge Plan: (Pending PT/OT evals) Barriers to Discharge: SNF Pending bed offer   Patient Goals and CMS Choice        Expected Discharge Plan and Services Expected Discharge Plan: (Pending PT/OT evals)       Living arrangements for the past 2 months: Single Family Home Expected Discharge Date: 03/30/19                                    Prior Living Arrangements/Services Living arrangements for the past 2 months: Single Family Home Lives with:: Self, Spouse Patient language and need for interpreter reviewed:: No        Need for Family Participation in Patient Care: Yes (Comment) Care giver support system in place?: Yes (comment)   Criminal Activity/Legal Involvement Pertinent to Current Situation/Hospitalization: No - Comment as needed  Activities of Daily Living Home Assistive  Devices/Equipment: Other (Comment)("secretarial chair" per pt) ADL Screening (condition at time of admission) Patient's cognitive ability adequate to safely complete daily activities?: Yes Is the patient deaf or have difficulty hearing?: Yes Does the patient have difficulty seeing, even when wearing glasses/contacts?: No Does the patient have difficulty concentrating, remembering, or making decisions?: No Patient able to express need for assistance with ADLs?: No Does the patient have difficulty dressing or bathing?: No Independently performs ADLs?: Yes (appropriate for developmental age) Does the patient have difficulty walking or climbing stairs?: No Weakness of Legs: Both Weakness of Arms/Hands: None  Permission Sought/Granted Permission sought to share information with : Family Supports Permission granted to share information with : Yes, Verbal Permission Granted  Share Information with NAME: [name]  Permission granted to share info w AGENCY: SNFs  Permission granted to share info w Relationship: spouse  Permission granted to share info w Contact Information: (c) [phone]  (h) [phone]  Emotional Assessment Appearance:: Appears stated age Attitude/Demeanor/Rapport: Engaged Affect (typically observed): Pleasant, Appropriate Orientation: : Oriented to Self, Oriented to Place, Oriented to  Time, Oriented to Situation Alcohol / Substance Use: Not Applicable Psych Involvement: No (comment)  Admission diagnosis:  Cellulitis of left foot [L03.116] Osteomyelitis of foot, left, acute ([place]) [M86.172] ESRD on hemodialysis ([place]) [N18.6, Z99.2] Septic arthritis of left foot, due to unspecified organism ([place]) [M00.9] Other acute osteomyelitis  of left foot [facility]) [M86.172] Patient Active Problem List   Diagnosis Date Noted  . Chronic bilateral low back pain without sciatica   . Septic arthritis of left foot ([place])   . Cellulitis of left foot   . Abscess of right foot   .  Pyogenic inflammation of bone ([place]) 03/23/2019  . Osteomyelitis of foot, left, acute ([place]) 03/23/2019  . Hyperlipidemia associated with type 2 diabetes mellitus ([place]) 01/09/2019  . Diabetes mellitus due to underlying condition with chronic kidney disease on chronic dialysis, with long-term current use of insulin ([place]) 01/09/2019  . ESRD on hemodialysis ([place])   . Generalized weakness   . Mitral valve mass   . Coronary artery disease involving coronary bypass graft of native heart without angina pectoris   . Bradycardia   . Anemia 05/03/2017  . Chronic kidney disease (CKD), active medical management without dialysis, stage 5 ([place]) 05/08/2015  . Diabetes mellitus type 2, insulin dependent ([place]) 05/08/2015  . BMI 29.0-29.9,adult 02/14/2015  . Medication management 08/04/2014  . Type II diabetes mellitus with neurological manifestations, uncontrolled ([place]) 02/17/2014  . Gouty arthritis   . Vitamin D deficiency   . Malignant neoplasm of upper-inner quadrant of left breast in female, estrogen receptor positive ([place]) 02/14/2013  . PVD (peripheral vascular disease) ([place]) 09/05/2009  . Hyperlipidemia 06/20/2009  . Essential hypertension 06/20/2009  . Congestive heart failure ([place]) 06/20/2009  . EMPHYSEMA 06/20/2009  . Asthma 06/20/2009   PCP:  Unk Pinto, MD Pharmacy:   [place], [place] - [address] [place] [place] [address] Phone: [phone] Fax: [facility] [place], [place] [address] Phone: [phone] Fax: [phone]     Social Determinants of Health (SDOH) Interventions    Readmission Risk Interventions No flowsheet data found.

## 2019-03-30 NOTE — Discharge Planning (Signed)
Physician Discharge Summary  MAVI UN XIP:[id number] DOB: Mar 24, 1946 DOA: 03/23/2019  PCP: Unk Pinto, MD  Admit date: 03/23/2019 Discharge date: 03/30/2019  Admitted From: home Disposition:  CIR   Recommendations for Outpatient Follow-up:  1. F/u with Dr Sharol Given in 1 wk   Discharge Condition:  stable   CODE STATUS:  Full code   Diet recommendation:  Heart healthy and diabetic Consultations:  Vascular surgery  Orthopedic surgery    Discharge Diagnoses:  Principal Problem:   Osteomyelitis of foot, left, acute ([place]) Active Problems:   Septic arthritis of left foot ([place])   Cellulitis and abscess of left foot   Essential hypertension   PVD (peripheral vascular disease) ([place])   Diabetes mellitus type 2, insulin dependent ([place])   ESRD on hemodialysis ([place])    Brief Summary: [name] 73 year old married female, independent, PMH of CAD s/p CABG, chronic combined CHF/ICM, stage IV CKD (baseline creatinine 2.7-3.1), HTN, HLD, DM, lower extremity PAD (bilateral SFA occlusion 07/2018), chronic bilateral feet ulcerations, osteomyelitis of left toe, LBBB who has had a chronic wound on her left foot that is followed at the wound care center closely, recently noted worsening infection and started on IV antibiotics including vancomycin at dialysis (reportedly completed 3 doses) sent to [facility] from the wound care center due to low-grade fevers and increased drainage from the ulcer. Admitted for left little toe osteomyelitis, abscess and sepsis complicating PAD. Nephrology consulted for HD needs. S/p aortogram with bilateral lower extremity arteriogram with runoff 12/18 followed by left CFA endarterectomy and Dacron patch angioplasty, left femoral to below-knee popliteal bypass graft on 12/21. Orthopedics/Dr. Sharol Given plans surgical intervention after vascular procedure.  Hospital Course:  Principal Problem:   Osteomyelitis-septic arthritis left fifth metatarsal  joint, abscess and ulcer in setting of Peripheral artery disease -Patient has failed outpatient antibiotic treatment and has been started on vancomycin and Zosyn in the hospital -Outpatient wound culture grew out S[place]-blood cultures have been negative -5/15-MRI left foot: 1. Septic arthritis at the fifth MTP joint with osteomyelitis involving the distal half of the fifth metatarsal and also the proximal phalanx. 2. Surrounding inflammatory phlegmon and abscess. 3. Diffuse cellulitis and myofasciitis. -12/18-lower extremity arteriogram revealed 70% stenosis of the left common femoral artery with heavily calcified plaque, SFA occluded throughout proximal mid and distal segment-50% right common femoral artery stenosis and right SFA occlusion throughout the entire segment- - 12/21-patient underwent left CFA endarterectomy and Dacron patch angioplasty and left below the knee popliteal bypass graft -Orthopedic surgery team does not plan on doing any further procedures in the hospital and will follow up on the patient in about a week in the office  Active Problems: Ulcers on right leg and foot -These are suspected to be ischemic ulcers -The patient states that these have been present for many months and apparently are healing slowly -She follows as outpatient with the wound care center    Essential hypertension -Continue current medications which include hydralazine and isosorbide dinitrate    Diabetes mellitus type 2, insulin dependent  -Last A1c was 6.3-at home she takes 70/30 insulin Continue Levemir and sliding scale insulin in the hospital    ESRD on hemodialysis  -Receives dialysis on Tuesday Thursday Saturday and received at this morning  Chronic systolic heart failure -Fluid management is with dialysis  Gout -Continue allopurinol  Anemia of chronic disease -Stable    Discharge Exam: Vitals:   03/30/19 0427 03/30/19 0902  BP: (!) 97/40 111/63  Pulse: 77  84   Resp: 18 15  Temp: 97.8 F (36.6 C) 97.6 F (36.4 C)  SpO2: 95% 100%   Vitals:   03/29/19 1745 03/29/19 1933 03/30/19 0427 03/30/19 0902  BP:  (!) 111/41 (!) 97/40 111/63  Pulse: 82 81 77 84  Resp: 18 12 18 15   Temp: 98.2 F (36.8 C) 97.7 F (36.5 C) 97.8 F (36.6 C) 97.6 F (36.4 C)  TempSrc: Oral Oral Oral Oral  SpO2: 100% 96% 95% 100%  Weight:   73.4 kg   Height:        General: Pt is alert, awake, not in acute distress Cardiovascular: RRR, S1/S2 +, no rubs, no gallops Respiratory: CTA bilaterally, no wheezing, no rhonchi Abdominal: Soft, NT, ND, bowel sounds + Extremities: no edema, no cyanosis   Discharge Instructions  Discharge Instructions    Increase activity slowly   Complete by: As directed      Allergies as of 03/30/2019      Reactions   Atenolol Other (See Comments), Rash   Exacerbates gout Exacerbates gout   Adhesive [tape] Other (See Comments)   SKIN IS VERY SENSITIVE AND BRUISES AND TEARS EASILY; PLEASE USE AN ALTERNATIVE TO TAPE!!   Contrast Media [iodinated Diagnostic Agents] Rash   Iohexol Rash, Other (See Comments)   Latex Rash      Medication List    STOP taking these medications   insulin NPH-regular Human (70-30) 100 UNIT/ML injection   loratadine 10 MG tablet Commonly known as: CLARITIN     TAKE these medications   allopurinol 100 MG tablet Commonly known as: ZYLOPRIM Take 1 tablet 2 x /day to Prevent Gout What changed:   how much to take  how to take this  when to take this  additional instructions   aspirin EC 81 MG tablet Take 1 tablet (81 mg total) by mouth daily.   atorvastatin 10 MG tablet Commonly known as: LIPITOR Take 1 tablet (10 mg total) by mouth daily at 6 PM.   b complex-vitamin c-folic acid 0.8 MG Tabs tablet Take 1 tablet by mouth 3 (three) times a week. Tuesday, Thursday, Saturday   BD Veo Insulin Syringe U/F 31G X 15/64" 1 ML Misc Generic drug: Insulin Syringe-Needle U-100 USE AS DIRECTED  WITH INSULIN   cinacalcet 30 MG tablet Commonly known as: SENSIPAR Take 30 mg by mouth 3 (three) times a week. Takes 1 tablet 3 days a week. Tues, Thur, Sat   cyclobenzaprine 5 MG tablet Commonly known as: FLEXERIL Take 1 tablet (5 mg total) by mouth 3 (three) times daily as needed for muscle spasms.   docusate sodium 100 MG capsule Commonly known as: COLACE Take 100 mg by mouth at bedtime.   EXCEDRIN EXTRA STRENGTH PO Take 2 tablets by mouth daily as needed (pain).   feeding supplement (NEPRO CARB STEADY) Liqd Take 237 mLs by mouth 2 (two) times daily between meals.   fluticasone 50 MCG/ACT nasal spray Commonly known as: FLONASE Place 2 sprays into both nostrils daily as needed for allergies or rhinitis.   glucose blood test strip Commonly known as: Prodigy No Coding Blood Gluc CHECK SUGARS 3 TIMES A DAY.DX-E11.22   hydrALAZINE 25 MG tablet Commonly known as: APRESOLINE Take 3 tablets (75 mg) 3 x /day for BP & Heart   HYDROcodone-acetaminophen 5-325 MG tablet Commonly known as: NORCO/VICODIN Take 0.5-1 tablets by mouth every 6 (six) hours as needed for severe pain.   insulin aspart 100 UNIT/ML injection Commonly known as: novoLOG  Inject 0-15 Units into the skin 3 (three) times daily with meals.   insulin detemir 100 UNIT/ML injection Commonly known as: LEVEMIR Inject 0.1 mLs (10 Units total) into the skin daily. Start taking on: March 31, 2019   isosorbide dinitrate 10 MG tablet Commonly known as: ISORDIL Take 1 tablet 3 x /day for BP & Heart What changed:   how much to take  how to take this  when to take this   pantoprazole 40 MG tablet Commonly known as: PROTONIX Take 1 tablet (40 mg total) by mouth daily. Start taking on: March 31, 2019   piperacillin-tazobactam 2-0.25 GM/50ML IVPB Commonly known as: ZOSYN Inject 50 mLs (2.25 g total) into the vein every 8 (eight) hours.   PRESERVISION AREDS 2 PO Take 2 capsules by mouth daily.    PROBIOTIC DAILY PO Take 1 tablet by mouth daily.   psyllium 58.6 % powder Commonly known as: METAMUCIL Take 1 packet by mouth 3 (three) times daily.   sevelamer carbonate 800 MG tablet Commonly known as: RENVELA Take 1,600 mg by mouth 3 (three) times daily with meals.   traZODone 50 MG tablet Commonly known as: DESYREL 1/2-2 tablets 1 hour prior to bedtime for sleep. What changed:   how much to take  how to take this  when to take this   Vancomycin 750-5 MG/150ML-% Soln Commonly known as: VANCOCIN Inject 150 mLs (750 mg total) into the vein Every Tuesday,Thursday,and Saturday with dialysis. Start taking on: March 31, 2019      Follow-up Information    Early, Arvilla Meres, MD Follow up in 3 week(s).   Specialties: Vascular Surgery, Cardiology Why: sent Contact information: [address] [place] [address] [phone]          Allergies  Allergen Reactions  . Atenolol Other (See Comments) and Rash    Exacerbates gout Exacerbates gout  . Adhesive [Tape] Other (See Comments)    SKIN IS VERY SENSITIVE AND BRUISES AND TEARS EASILY; PLEASE USE AN ALTERNATIVE TO TAPE!!  . Contrast Media [Iodinated Diagnostic Agents] Rash  . Iohexol Rash and Other (See Comments)  . Latex Rash     Procedures/Studies:    DG Chest 2 View  Result Date: 03/23/2019 CLINICAL DATA:  Left foot ulcer. Sepsis. EXAM: CHEST - 2 VIEW COMPARISON:  Aug 25, 2018. FINDINGS: The heart size and mediastinal contours are within normal limits. No pneumothorax or significant pleural effusion is noted. Status post coronary bypass graft. No acute pulmonary disease is noted. Atherosclerosis of thoracic aorta is noted. The visualized skeletal structures are unremarkable. IMPRESSION: No active cardiopulmonary disease. Aortic atherosclerosis. Electronically Signed   By: Marijo Conception M.D.   On: 03/23/2019 14:07   MR FOOT LEFT WO CONTRAST  Result Date: 03/23/2019 CLINICAL DATA:  Nonhealing ulcer along  the lateral aspect of the foot. EXAM: MRI OF THE LEFT FOOT WITHOUT CONTRAST TECHNIQUE: Multiplanar, multisequence MR imaging of the 03/10/2019 was performed. No intravenous contrast was administered. COMPARISON:  Radiographs 03/10/2019 FINDINGS: Diffuse abnormal T1 and T2 signal intensity in the mid distal aspect of the fifth metatarsal and also in the proximal phalanx. There is joint space narrowing at the fifth MTP joint, small joint effusion and surrounding inflammatory phlegmon and abscess. Findings consistent with septic arthritis at the fifth MTP joint and osteomyelitis involving the fifth metatarsal and fifth proximal phalanx. Remote partial second ray amputation. No other areas of osteomyelitis are identified. Moderate to advanced midfoot degenerative changes but no erosive findings or  destructive bony changes. Diffuse subcutaneous soft tissue swelling/edema/fluid suggesting cellulitis. There is also mild diffuse myositis. IMPRESSION: 1. Septic arthritis at the fifth MTP joint with osteomyelitis involving the distal half of the fifth metatarsal and also the proximal phalanx. 2. Surrounding inflammatory phlegmon and abscess. 3. Diffuse cellulitis and myofasciitis. Electronically Signed   By: Marijo Sanes M.D.   On: 03/23/2019 12:07   PERIPHERAL VASCULAR CATHETERIZATION  Result Date: 03/25/2019 Patient name: TELA KOTECKI         MRN: [id number]        DOB: 1945/07/21          Sex: female  03/25/2019 Pre-operative Diagnosis: Critical limb ischemia of the bilateral lower extremities with bilateral lower extremity tissue loss.  Current hospital admission prompted by left fifth metatarsal osteomyelitis and open left foot wound. Post-operative diagnosis:  Same Surgeon:  Marty Heck, MD Procedure Performed: 1.  Ultrasound-guided access of the right common femoral artery 2.  Aortogram 3.  Bilateral lower extremity arteriogram with runoff 4.  32 minutes of monitored moderate conscious sedation time   Indications: Patient is a 73 year old female with multiple medical comorbidities well-known to the vascular surgery service that was admitted with open wound and left fifth metatarsal osteomyelitis after failure of outpatient antibiotics.  Ultimately she has known bilateral SFA occlusions with monophasic runoff at the ankle from vascular studies earlier this year.  She was previously evaluated for right lower extremity wound by Dr. Donnetta Hutching that was healing at the time of initial evaluation in April of this year.  Her right foot wound persists at this time.  She now presents for aortogram, bilateral lower extremity arteriogram, possible intervention after risk and benefits were discussed.  Findings:  Aortogram showed calcified but patent aortoiliac segment bilaterally with no overt flow-limiting stenosis.  Left lower extremity arteriogram shows at least 70% stenosis in the left common femoral artery with heavily calcified plaque.  Patient has a flush SFA occlusion on the left with profunda runoff.  Her SFA is occluded throughout its proximal mid and distal segment.  She does reconstitute a heavily diseased above-knee popliteal artery.  The below-knee popliteal artery looks much healthier although heavily calcified.  Patient's dominant runoff is via the anterior tibial but does appear to have at least three-vessel runoff.  There is significant small vessel disease in the left foot.  Right lower extremity arteriogram shows again at least 50% common femoral artery stenosis with dominant runoff via the profunda.  There is a very tiny SFA stump with large calcified plaque but then it occludes shortly after takeoff.  Again the SFA is occluded throughout its entire segment.  She does reconstitute heavily diseased below-knee popliteal artery with near occlusive plaque and single-vessel runoff via an anterior tibial artery.             Procedure:  The patient was identified in the holding area and taken to room 8.  The  patient was then placed supine on the table and prepped and draped in the usual sterile fashion.  A time out was called.  Ultrasound was used to evaluate the right common femoral artery.  It was patent .  A digital ultrasound image was acquired.  A micropuncture needle was used to access the right common femoral artery under ultrasound guidance.  An 018 wire was advanced without resistance and a micropuncture sheath was placed.  The 018 wire was removed and a benson wire was placed.  The micropuncture sheath was exchanged for a  5 french sheath.  An omniflush catheter was advanced over the wire to the level of L-1.  An abdominal angiogram was obtained.  Next, the Omni Flush catheter was pulled down in the aorta and bilateral lower extremity runoff was obtained.  Pertinent findings are noted above.  Given the patient had a sheath in the right common femoral artery that was fairly diseased we had fairly limited evaluation of the right lower extremity.  As a result we then removed the Omni Flush catheter over a wire.  We then connected our contrast tubing to the sheath in the right common femoral artery and retrograde runoff was obtained of the right lower extremity in staged images to get better views.  All pertinent findings are noted above.  Once imaging was complete, the sheath in the right common femoral artery was then pulled and manual pressure was held for 20 minutes.  She tolerated the procedure without any apparent complications.  Plan: Patient will be scheduled for left common femoral endarterectomy with left femoral to below-knee popliteal bypass on Monday with Dr. Donnetta Hutching.  Vein mapping has been ordered.  Marty Heck, MD [facility] Office: [phone] Pager: [phone]  Marty Heck  DG Foot Complete Left  Result Date: 03/10/2019 CLINICAL DATA:  Diabetic ulcer. EXAM: LEFT FOOT - COMPLETE 3+ VIEW COMPARISON:  04/13/2018. FINDINGS: Prior left second  digit amputation. Soft tissue bandage noted. Radiopacity noted over the base of the left fifth digit, possibly related to overlying bandaging. Air noted the soft tissues of the left fifth digit. Associated soft tissue swelling noted. Visualize underlying bony structures are unremarkable. If osteomyelitis remains a clinical concern MRI can be obtained. Stable deformity distal phalanx left third digit. Diffuse osteopenia and degenerative change. Degenerative changes most prominent about the first MTP joint. No evidence of acute fracture or dislocation. Peripheral vascular calcification. IMPRESSION: 1. Soft tissue bandage noted. Radiopacity noted the base of the left fifth digit, possibly related overlying bandaging. Air noted over the soft tissues left fifth digit. Associated soft tissue swelling noted. Visualized underlying bony structures unremarkable. If osteomyelitis remains a clinical concern MRI can be obtained. 2. Diffuse osteopenia and degenerative change. Stable deformity distal phalanx of the left fourth digit. Degenerative changes most prominent about the left fifth MTP. No evidence of acute fracture or dislocation. 3.  Peripheral vascular disease. Electronically Signed   By: Marcello Moores  Register   On: 03/10/2019 10:21   DG Foot Complete Right  Result Date: 03/10/2019 CLINICAL DATA:  Right plantar diabetic foot ulcer. EXAM: RIGHT FOOT COMPLETE - 3+ VIEW COMPARISON:  04/13/2018 FINDINGS: Stable postsurgical amputations second and fifth toes. Diffuse osteopenia. Hallux valgus deformity. Degenerative change of the first MTP joint as well as midfoot. Small inferior calcaneal spur. No acute fracture or dislocation. No focal bone destruction to suggest osteomyelitis. Tiny focus of air over the mid plantar soft tissues likely representing patient's known plantar soft tissue ulceration. IMPRESSION: 1. Small focus of air over the mid plantar soft tissues likely representing patient's known plantar soft tissue ulcer.  No evidence of underlying osteomyelitis. 2. Degenerative changes as described. Postsurgical amputations of the second and fifth toes unchanged. Electronically Signed   By: Marin Olp M.D.   On: 03/10/2019 10:18   VAS [name] LOWER EXTREMITY SAPHENOUS VEIN MAPPING  Result Date: 03/26/2019 LOWER EXTREMITY VEIN MAPPING Indications:        Pre-op Other Indications:  PAD Other Risk Factors: Hx Rt GSV harvest for surgery.  Performing  Technologist: June Leap RDMS, RVT  Examination Guidelines: A complete evaluation includes B-mode imaging, spectral Doppler, color Doppler, and power Doppler as needed of all accessible portions of each vessel. Bilateral testing is considered an integral part of a complete examination. Limited examinations for reoccurring indications may be performed as noted. +--------------+---------------+--------------------+--------------+-----------+  RT Diameter    RT Findings          GSV          LT Diameter  LT Findings      (cm)                                             (cm)                 +--------------+---------------+--------------------+--------------+-----------+                                 Saphenofemoral        0.75                                                    Junction                                +--------------+---------------+--------------------+--------------+-----------+               branching /0.15   Proximal thigh        0.38      branching                   cm branch                                                 +--------------+---------------+--------------------+--------------+-----------+                  branching        Mid thigh           0.30      branching  +--------------+---------------+--------------------+--------------+-----------+                                  Distal thigh         0.27                 +--------------+---------------+--------------------+--------------+-----------+      0.16                             Knee             0.35                 +--------------+---------------+--------------------+--------------+-----------+      0.22                         Prox calf           0.32  branching  +--------------+---------------+--------------------+--------------+-----------+      0.22        branching         Mid calf           0.23      branching  +--------------+---------------+--------------------+--------------+-----------+      0.27                        Distal calf          0.22                 +--------------+---------------+--------------------+--------------+-----------+ Diagnosing physician: Harold Barban MD Electronically signed by Harold Barban MD on 03/26/2019 at 4:39:30 PM.    Final      The results of significant diagnostics from this hospitalization (including imaging, microbiology, ancillary and laboratory) are listed below for reference.     Microbiology: Recent Results (from the past 240 hour(s))  Culture, blood (Routine x 2)     Status: None   Collection Time: 03/23/19  1:38 PM   Specimen: BLOOD  Result Value Ref Range Status   Specimen Description BLOOD LEFT ANTECUBITAL  Final   Special Requests   Final    BOTTLES DRAWN AEROBIC AND ANAEROBIC Blood Culture results may not be optimal due to an inadequate volume of blood received in culture bottles   Culture   Final    NO GROWTH 5 DAYS Performed at [facility], [place] [address] [place], [place] [address]    Report Status 03/28/2019 FINAL  Final  Culture, blood (Routine x 2)     Status: None   Collection Time: 03/23/19  3:38 PM   Specimen: BLOOD  Result Value Ref Range Status   Specimen Description BLOOD BLOOD LEFT FOREARM  Final   Special Requests   Final    BOTTLES DRAWN AEROBIC AND ANAEROBIC Blood Culture results may not be optimal due to an excessive volume of blood received in culture bottles   Culture   Final    NO GROWTH 5 DAYS Performed at [facility],  [place] [address], [place], [place] [address]    Report Status 03/28/2019 FINAL  Final  SARS CORONAVIRUS 2 (TAT 6-24 HRS) Nasopharyngeal Nasopharyngeal Swab     Status: None   Collection Time: 03/23/19  3:57 PM   Specimen: Nasopharyngeal Swab  Result Value Ref Range Status   SARS Coronavirus 2 NEGATIVE NEGATIVE Final    Comment: (NOTE) SARS-CoV-2 target nucleic acids are NOT DETECTED. The SARS-CoV-2 RNA is generally detectable in upper and lower respiratory specimens during the acute phase of infection. Negative results do not preclude SARS-CoV-2 infection, do not rule out co-infections with other pathogens, and should not be used as the sole basis for treatment or other patient management decisions. Negative results must be combined with clinical observations, patient history, and epidemiological information. The expected result is Negative. Fact Sheet for Patients: SugarRoll.be Fact Sheet for Healthcare Providers: https://www.woods-mathews.com/ This test is not yet approved or cleared by the [place] FDA and  has been authorized for detection and/or diagnosis of SARS-CoV-2 by FDA under an Emergency Use Authorization (EUA). This EUA will remain  in effect (meaning this test can be used) for the duration of the COVID-19 declaration under Section 56 4(b)(1) of the Act, 21 U.S.C. section 360bbb-3(b)(1), unless the authorization is terminated or revoked sooner. Performed at [facility], [place] [address], [place],  [address]   MRSA PCR Screening  Status: None   Collection Time: 03/24/19  6:10 AM   Specimen: Nasal Mucosa; Nasopharyngeal  Result Value Ref Range Status   MRSA by PCR NEGATIVE NEGATIVE Final    Comment:        The GeneXpert MRSA Assay (FDA approved for NASAL specimens only), is one component of a comprehensive MRSA colonization surveillance program. It is not intended to diagnose MRSA infection nor to guide  or monitor treatment for MRSA infections. Performed at [facility], [place] [address] [place], [place] [address]      Labs: BNP (last 3 results) Recent Labs    08/25/18 0026  BNP 350.0*   Basic Metabolic Panel: Recent Labs  Lab 03/24/19 1407 03/25/19 0500 03/25/19 1000 03/26/19 2013 03/28/19 0329 03/29/19 0508  NA 135 135 135 133* 137 134*  K 4.5 4.7 5.6* 3.9 3.4* 4.9  CL 96* 96* 96* 95* 94* 97*  CO2 25 23 21* 25 26 21*  GLUCOSE 230* 188* 172* 151* 180* 172*  BUN 63* 71* 71* 48* 41* 51*  CREATININE 4.29* 4.61* 4.76* 3.52* 3.56* 4.21*  CALCIUM 8.1* 7.9* 8.2* 7.7* 8.0* 7.0*  PHOS 4.5  --  5.3* 4.3  --   --    Liver Function Tests: Recent Labs  Lab 03/23/19 1355 03/24/19 1407 03/25/19 1000 03/26/19 2013  AST 22  --   --   --   ALT 23  --   --   --   ALKPHOS 192*  --   --   --   BILITOT 0.5  --   --   --   PROT 5.9*  --   --   --   ALBUMIN 2.8* 2.7* 2.8* 2.5*   No results for input(s): LIPASE, AMYLASE in the last 168 hours. No results for input(s): AMMONIA in the last 168 hours. CBC: Recent Labs  Lab 03/23/19 1355 03/25/19 0500 03/26/19 2013 03/28/19 0329 03/29/19 0508  WBC 10.7* 8.2 10.8* 8.5 11.5*  NEUTROABS 8.0*  --   --   --   --   HGB 10.8* 11.6* 10.3* 12.7 8.0*  HCT 33.1* 36.3 31.0* 39.0 24.7*  MCV 94.6 94.0 93.7 94.4 95.4  PLT 274 252 266 298 238   Cardiac Enzymes: No results for input(s): CKTOTAL, CKMB, CKMBINDEX, TROPONINI in the last 168 hours. BNP: Invalid input(s): POCBNP CBG: Recent Labs  Lab 03/29/19 0641 03/29/19 1405 03/29/19 1844 03/29/19 2106 03/30/19 0607  GLUCAP 146* 145* 168* 162* 114*   D-Dimer No results for input(s): DDIMER in the last 72 hours. Hgb A1c No results for input(s): HGBA1C in the last 72 hours. Lipid Profile No results for input(s): CHOL, HDL, LDLCALC, TRIG, CHOLHDL, LDLDIRECT in the last 72 hours. Thyroid function studies No results for input(s): TSH, T4TOTAL, T3FREE, THYROIDAB in the last 72  hours.  Invalid input(s): FREET3 Anemia work up No results for input(s): VITAMINB12, FOLATE, FERRITIN, TIBC, IRON, RETICCTPCT in the last 72 hours. Urinalysis    Component Value Date/Time   COLORURINE YELLOW 01/10/2019 1525   APPEARANCEUR CLEAR 01/10/2019 1525   LABSPEC 1.019 01/10/2019 1525   PHURINE 7.5 01/10/2019 1525   GLUCOSEU TRACE (A) 01/10/2019 1525   HGBUR NEGATIVE 01/10/2019 1525   BILIRUBINUR NEGATIVE 08/25/2018 0045   KETONESUR NEGATIVE 01/10/2019 1525   PROTEINUR 3+ (A) 01/10/2019 1525   UROBILINOGEN 0.2 05/30/2009 1500   NITRITE NEGATIVE 01/10/2019 1525   LEUKOCYTESUR NEGATIVE 01/10/2019 1525   Sepsis Labs Invalid input(s): PROCALCITONIN,  WBC,  LACTICIDVEN Microbiology Recent  Results (from the past 240 hour(s))  Culture, blood (Routine x 2)     Status: None   Collection Time: 03/23/19  1:38 PM   Specimen: BLOOD  Result Value Ref Range Status   Specimen Description BLOOD LEFT ANTECUBITAL  Final   Special Requests   Final    BOTTLES DRAWN AEROBIC AND ANAEROBIC Blood Culture results may not be optimal due to an inadequate volume of blood received in culture bottles   Culture   Final    NO GROWTH 5 DAYS Performed at [facility], [place] [address], [place], [place] [address]    Report Status 03/28/2019 FINAL  Final  Culture, blood (Routine x 2)     Status: None   Collection Time: 03/23/19  3:38 PM   Specimen: BLOOD  Result Value Ref Range Status   Specimen Description BLOOD BLOOD LEFT FOREARM  Final   Special Requests   Final    BOTTLES DRAWN AEROBIC AND ANAEROBIC Blood Culture results may not be optimal due to an excessive volume of blood received in culture bottles   Culture   Final    NO GROWTH 5 DAYS Performed at [facility], [place] [address], [place], [place] [address]    Report Status 03/28/2019 FINAL  Final  SARS CORONAVIRUS 2 (TAT 6-24 HRS) Nasopharyngeal Nasopharyngeal Swab     Status: None   Collection Time: 03/23/19  3:57 PM   Specimen:  Nasopharyngeal Swab  Result Value Ref Range Status   SARS Coronavirus 2 NEGATIVE NEGATIVE Final    Comment: (NOTE) SARS-CoV-2 target nucleic acids are NOT DETECTED. The SARS-CoV-2 RNA is generally detectable in upper and lower respiratory specimens during the acute phase of infection. Negative results do not preclude SARS-CoV-2 infection, do not rule out co-infections with other pathogens, and should not be used as the sole basis for treatment or other patient management decisions. Negative results must be combined with clinical observations, patient history, and epidemiological information. The expected result is Negative. Fact Sheet for Patients: SugarRoll.be Fact Sheet for Healthcare Providers: https://www.woods-mathews.com/ This test is not yet approved or cleared by the [place] FDA and  has been authorized for detection and/or diagnosis of SARS-CoV-2 by FDA under an Emergency Use Authorization (EUA). This EUA will remain  in effect (meaning this test can be used) for the duration of the COVID-19 declaration under Section 56 4(b)(1) of the Act, 21 U.S.C. section 360bbb-3(b)(1), unless the authorization is terminated or revoked sooner. Performed at [facility], [place] [address] [place], [place] [address]   MRSA PCR Screening     Status: None   Collection Time: 03/24/19  6:10 AM   Specimen: Nasal Mucosa; Nasopharyngeal  Result Value Ref Range Status   MRSA by PCR NEGATIVE NEGATIVE Final    Comment:        The GeneXpert MRSA Assay (FDA approved for NASAL specimens only), is one component of a comprehensive MRSA colonization surveillance program. It is not intended to diagnose MRSA infection nor to guide or monitor treatment for MRSA infections. Performed at [facility], [place] [address] [place], [place] [address]      Time coordinating discharge in minutes: 69  SIGNED:   Debbe Odea, MD  [facility] 03/30/2019, 12:14 PM Pager   If 7PM-7AM, please contact night-coverage [email or web address] Password [email or web address]

## 2019-03-30 NOTE — Progress Notes (Signed)
Noted revascularization. No surgery planned at this time . If stable could follow up next week with Dr. Sharol Given  After 12/30 for surgical planning

## 2019-03-30 NOTE — Progress Notes (Signed)
ABI       has been completed. Preliminary results can be found under CV proc through chart review. Nyisha Clippard, BS, RDMS, RVT   

## 2019-03-30 NOTE — Progress Notes (Addendum)
Inpatient Rehab Admissions:  Inpatient Rehab Consult received.  I met with pt at the bedside for rehabilitation assessment. Pt understands she is not yet ready to DC home from a functional standpoint and will need rehab. Pt's husband is only able to provide supervision at DC as he recently had surgery. We discussed that there may be possibility of her having pending surgery in the new year where she may be NWB on that leg. Since there is a possibility of further surgery in the near future that would change her WB status, there is no benefit to the patient being admitted to CIR in the meantime. At this time, I recommend SNF until follow up with Dr. Duda. Pt and her husband aware of recommendation and in agreement for SNF as well. TOC team notified.   AC will sign off.   Please call if questions.    Kelly Gentry, OTR/L  Rehab Admissions Coordinator  [phone] 03/30/2019 12:10 PM   

## 2019-03-30 NOTE — Progress Notes (Signed)
  Progress Note    03/30/2019 7:39 AM 2 Days Post-Op  Subjective:  Better pain control with muscle relaxer   Vitals:   03/29/19 1933 03/30/19 0427  BP: (!) 111/41 (!) 97/40  Pulse: 81 77  Resp: 12 18  Temp: 97.7 F (36.5 C) 97.8 F (36.6 C)  SpO2: 96% 95%   Physical Exam: Lungs:  Non labored Incisions:  Incisions of LLE c/d/i; L groin with ecchymosis but soft Extremities:  Faintly palpable L ATA; brisk L PT by doppler Abdomen:  Soft Neurologic: A&O  CBC    Component Value Date/Time   WBC 11.5 (H) 03/29/2019 0508   RBC 2.59 (L) 03/29/2019 0508   HGB 8.0 (L) 03/29/2019 0508   HGB 11.7 05/18/2017 1218   HGB 12.3 02/02/2017 1136   HCT 24.7 (L) 03/29/2019 0508   HCT 34.7 05/18/2017 1218   HCT 37.4 02/02/2017 1136   PLT 238 03/29/2019 0508   PLT 227 05/18/2017 1218   MCV 95.4 03/29/2019 0508   MCV 87 05/18/2017 1218   MCV 90.6 02/02/2017 1136   MCH 30.9 03/29/2019 0508   MCHC 32.4 03/29/2019 0508   RDW 15.9 (H) 03/29/2019 0508   RDW 16.0 (H) 05/18/2017 1218   RDW 15.7 (H) 02/02/2017 1136   LYMPHSABS 1.5 03/23/2019 1355   LYMPHSABS 1.8 02/02/2017 1136   MONOABS 1.1 (H) 03/23/2019 1355   MONOABS 0.8 02/02/2017 1136   EOSABS 0.1 03/23/2019 1355   EOSABS 0.3 02/02/2017 1136   BASOSABS 0.0 03/23/2019 1355   BASOSABS 0.1 02/02/2017 1136    BMET    Component Value Date/Time   NA 134 (L) 03/29/2019 0508   NA 143 05/18/2017 1218   NA 143 02/02/2017 1137   K 4.9 03/29/2019 0508   K 3.8 02/02/2017 1137   CL 97 (L) 03/29/2019 0508   CO2 21 (L) 03/29/2019 0508   CO2 24 02/02/2017 1137   GLUCOSE 172 (H) 03/29/2019 0508   GLUCOSE 74 02/02/2017 1137   BUN 51 (H) 03/29/2019 0508   BUN 76 (HH) 05/18/2017 1218   BUN 63.4 (H) 02/02/2017 1137   CREATININE 4.21 (H) 03/29/2019 0508   CREATININE 3.17 (H) 01/10/2019 1525   CREATININE 3.0 (HH) 02/02/2017 1137   CALCIUM 7.0 (L) 03/29/2019 0508   CALCIUM 9.9 02/02/2017 1137   GFRNONAA 10 (L) 03/29/2019 0508   GFRNONAA  14 (L) 01/10/2019 1525   GFRAA 11 (L) 03/29/2019 0508   GFRAA 16 (L) 01/10/2019 1525    INR    Component Value Date/Time   INR 1.1 03/28/2019 0329     Intake/Output Summary (Last 24 hours) at 03/30/2019 0739 Last data filed at 03/30/2019 0344 Gross per 24 hour  Intake 290 ml  Output 2000 ml  Net -1710 ml     Assessment/Plan:  73 y.o. female is s/p L CFA endarterectomy and femoral to BK pop bypass with vein 2 Days Post-Op   LLE warm and well perfused with faintly palpable L ATA Plans noted for outpatient follow up with Dr. Sharol Given Encouraged Ramond Craver for discharge from vascular standpoint when mobility improved   Dagoberto Ligas, PA-C [facility] [phone] 03/30/2019 7:39 AM

## 2019-03-30 NOTE — Consult Note (Signed)
   [facility] CM Inpatient Consult   03/30/2019  Kayler Buckholtz [facility] 18-Aug-1945 [id number]    Patientreviewed for 31% extreme high risk score for readmission and hospitalization; and to check for [place] Managementneeds under her Medicare/ [employer] ACO plan.  Patient was previously engaged by [employer] for [facility] HF follow-up calls and [facility] pharmacy for medication management.   Review of medical record shows that: Pamela Kiehn Whitson58 year old married female, independent, PMH of CAD s/p CABG, chronic combined CHF/ICM, stage IV CKD (baseline creatinine 2.7-3.1), HTN, HLD, DM, lower extremity PAD (bilateral SFA occlusion 07/2018), chronic bilateral feet ulcerations, osteomyelitis of left toe, LBBB; with chronic wound on her left foot that is followed at the wound care center closely.     Admitted for left little toe osteomyelitis, abscess and sepsis complicating PAD. Nephrology consulted for HD needs.  Chart review reveals recommendation for disposition to CIR (comprehensive inpatient rehab) per PT/ OT.   Inpatient rehab admissions consult note recommends SNF (skilled nursing facility) until follow up with Dr. Sharol Given, with patient and husband aware of recommendation and in agreement for SNF.   Plan: Will follow for disposition. Ifthere are any changes in disposition plan, please make areferral to [facility] managementfor follow-upof needsas appropriate.      [facility] post acute RN coordinator will be made aware of patient's disposition to any facility covered by [place] post discharge follow up of needs.   For questions andreferral, please contact:  [place] A. Shawnta Zimbelman, BSN, RN-BC [facility] Liaison Cell: [phone]

## 2019-03-30 NOTE — Progress Notes (Signed)
PROGRESS NOTE    [name]   QQV:[id number]  DOB: [birth year]  DOA: 03/23/2019 PCP: Unk Pinto, MD   Brief Narrative:  [name] 73 year old married female, independent, PMH of CAD s/p CABG, chronic combined CHF/ICM, stage IV CKD (baseline creatinine 2.7-3.1), HTN, HLD, DM, lower extremity PAD (bilateral SFA occlusion 07/2018), chronic bilateral feet ulcerations, osteomyelitis of left toe, LBBB who has had a chronic wound on her left foot that is followed at the wound care center closely, recently noted worsening infection and started on IV antibiotics including vancomycin at dialysis (reportedly completed 3 doses) sent to [facility] from the wound care center due to low-grade fevers and increased drainage from the ulcer.  Admitted for left little toe osteomyelitis, abscess and sepsis complicating PAD.  Nephrology consulted for HD needs.  S/p aortogram with bilateral lower extremity arteriogram with runoff 12/18 followed by left CFA endarterectomy and Dacron patch angioplasty, left femoral to below-knee popliteal bypass graft on 12/21.  Orthopedics/Dr. Sharol Given plans surgical intervention after vascular procedure.    Subjective: She has no complaints.     Assessment & Plan:   Principal Problem:   Osteomyelitis-septic arthritis left fifth metatarsal joint, abscess and ulcer in setting of Peripheral artery disease -Patient has failed outpatient antibiotic treatment and has been started on vancomycin and Zosyn in the hospital -Outpatient wound culture grew out Serratia-blood cultures have been negative -5/15-MRI left foot: 1. Septic arthritis at the fifth MTP joint with osteomyelitis involving the distal half of the fifth metatarsal and also the proximal phalanx. 2. Surrounding inflammatory phlegmon and abscess. 3. Diffuse cellulitis and myofasciitis.-12/18-lower extremity arteriogram revealed 70% stenosis of the left common femoral artery with heavily calcified  plaque, SFA occluded throughout proximal mid and distal segment-50% right common femoral artery stenosis and right SFA occlusion throughout the entire segment- - 12/21-patient underwent left CFA endarterectomy and Dacron patch angioplasty and left below the knee popliteal bypass graft -Orthopedic surgery team does not plan on doing any further procedures in the hospital and will follow up on the patient in about a week in the office  Active Problems: Ulcers on right leg and foot -These are suspected to be ischemic ulcers -The patient states that these have been present for many months and apparently are healing slowly -She follows as outpatient with the wound care center    Essential hypertension -Continue current medications which include hydralazine and isosorbide dinitrate    Diabetes mellitus type 2, insulin dependent  -Last A1c was 6.3-at home she takes 70/30 insulin Continue Levemir and sliding scale insulin in the hospital    ESRD on hemodialysis  -Receives dialysis on Tuesday Thursday Saturday and received at this morning  Chronic systolic heart failure -Fluid management is with dialysis  Gout -Continue allopurinol  Anemia of chronic disease -Stable   Time spent in minutes: 35 DVT prophylaxis: SCDs Code Status: Full code Family Communication: None at bedside Disposition Plan: -continue to treat infection Consultants:   Vascular surgery  Orthopedic surgery  Nephrology Procedures:   Dr. Carlis Abbott, VVS S/p aortogram with bilateral lower extremity arteriogram with runoff 12/18.   Dr. Donnetta Hutching, VVS S/P left CFA endarterectomy and Dacron patch angioplasty, left femoral to below-knee popliteal bypass graft on 12/21.  HD Antimicrobials:  Anti-infectives (From admission, onward)   Start     Dose/Rate Route Frequency Ordered Stop   03/29/19 1200  vancomycin (VANCOCIN) IVPB 750 mg/150 ml premix     750 mg 150 mL/hr over 60 Minutes Intravenous Every  T-Th-Sa (Hemodialysis)  03/26/19 2303     03/28/19 0600  cefUROXime (ZINACEF) 1.5 g in sodium chloride 0.9 % 100 mL IVPB     1.5 g 200 mL/hr over 30 Minutes Intravenous On call to O.R. 03/27/19 1132 03/28/19 1453   03/26/19 2359  vancomycin (VANCOREADY) IVPB 500 mg/100 mL     500 mg 100 mL/hr over 60 Minutes Intravenous  Once 03/26/19 2303 03/27/19 0716   03/25/19 1730  vancomycin (VANCOCIN) IVPB 750 mg/150 ml premix     750 mg 150 mL/hr over 60 Minutes Intravenous  Once 03/25/19 1513 03/25/19 1627   03/24/19 1800  piperacillin-tazobactam (ZOSYN) IVPB 2.25 g     2.25 g 100 mL/hr over 30 Minutes Intravenous Every 8 hours 03/24/19 1631     03/24/19 0000  vancomycin variable dose per unstable renal function (pharmacist dosing)  Status:  Discontinued      Does not apply See admin instructions 03/23/19 1528 03/29/19 0720   03/23/19 1530  vancomycin (VANCOREADY) IVPB 1500 mg/300 mL     1,500 mg 150 mL/hr over 120 Minutes Intravenous  Once 03/23/19 1524 03/23/19 1949   03/23/19 1515  piperacillin-tazobactam (ZOSYN) IVPB 3.375 g     3.375 g 100 mL/hr over 30 Minutes Intravenous  Once 03/23/19 1504 03/23/19 1556       Objective: Vitals:   03/29/19 1745 03/29/19 1933 03/30/19 0427 03/30/19 0902  BP:  (!) 111/41 (!) 97/40 111/63  Pulse: 82 81 77 84  Resp: 18 12 18 15   Temp: 98.2 F (36.8 C) 97.7 F (36.5 C) 97.8 F (36.6 C) 97.6 F (36.4 C)  TempSrc: Oral Oral Oral Oral  SpO2: 100% 96% 95% 100%  Weight:   73.4 kg   Height:        Intake/Output Summary (Last 24 hours) at 03/30/2019 0908 Last data filed at 03/30/2019 0344 Gross per 24 hour  Intake 50 ml  Output 2000 ml  Net -1950 ml   Filed Weights   03/29/19 0600 03/29/19 0905 03/30/19 0427  Weight: 73.6 kg 73.9 kg 73.4 kg    Examination: General exam: Appears comfortable  HEENT: PERRLA, oral mucosa moist, no sclera icterus or thrush Respiratory system: Clear to auscultation. Respiratory effort normal. Cardiovascular system: S1 & S2 heard,  No  murmurs  Gastrointestinal system: Abdomen soft, non-tender, nondistended. Normal bowel sounds   Central nervous system: Alert and oriented. No focal neurological deficits. Extremities: No cyanosis, clubbing - ulcers noted on left foot/metarsal area, right foot and right shin Skin: No rashes or ulcers Psychiatry:  Mood & affect appropriate.     Data Reviewed: I have personally reviewed following labs and imaging studies  CBC: Recent Labs  Lab 03/23/19 1355 03/25/19 0500 03/26/19 2013 03/28/19 0329 03/29/19 0508  WBC 10.7* 8.2 10.8* 8.5 11.5*  NEUTROABS 8.0*  --   --   --   --   HGB 10.8* 11.6* 10.3* 12.7 8.0*  HCT 33.1* 36.3 31.0* 39.0 24.7*  MCV 94.6 94.0 93.7 94.4 95.4  PLT 274 252 266 298 301   Basic Metabolic Panel: Recent Labs  Lab 03/24/19 1407 03/25/19 0500 03/25/19 1000 03/26/19 2013 03/28/19 0329 03/29/19 0508  NA 135 135 135 133* 137 134*  K 4.5 4.7 5.6* 3.9 3.4* 4.9  CL 96* 96* 96* 95* 94* 97*  CO2 25 23 21* 25 26 21*  GLUCOSE 230* 188* 172* 151* 180* 172*  BUN 63* 71* 71* 48* 41* 51*  CREATININE 4.29* 4.61* 4.76* 3.52* 3.56* 4.21*  CALCIUM 8.1* 7.9* 8.2* 7.7* 8.0* 7.0*  PHOS 4.5  --  5.3* 4.3  --   --    GFR: Estimated Creatinine Clearance: 11.7 mL/min (A) (by C-G formula based on SCr of 4.21 mg/dL (H)). Liver Function Tests: Recent Labs  Lab 03/23/19 1355 03/24/19 1407 03/25/19 1000 03/26/19 2013  AST 22  --   --   --   ALT 23  --   --   --   ALKPHOS 192*  --   --   --   BILITOT 0.5  --   --   --   PROT 5.9*  --   --   --   ALBUMIN 2.8* 2.7* 2.8* 2.5*   No results for input(s): LIPASE, AMYLASE in the last 168 hours. No results for input(s): AMMONIA in the last 168 hours. Coagulation Profile: Recent Labs  Lab 03/23/19 1355 03/28/19 0329  INR 1.2 1.1   Cardiac Enzymes: No results for input(s): CKTOTAL, CKMB, CKMBINDEX, TROPONINI in the last 168 hours. BNP (last 3 results) No results for input(s): PROBNP in the last 8760  hours. HbA1C: No results for input(s): HGBA1C in the last 72 hours. CBG: Recent Labs  Lab 03/29/19 0641 03/29/19 1405 03/29/19 1844 03/29/19 2106 03/30/19 0607  GLUCAP 146* 145* 168* 162* 114*   Lipid Profile: No results for input(s): CHOL, HDL, LDLCALC, TRIG, CHOLHDL, LDLDIRECT in the last 72 hours. Thyroid Function Tests: No results for input(s): TSH, T4TOTAL, FREET4, T3FREE, THYROIDAB in the last 72 hours. Anemia Panel: No results for input(s): VITAMINB12, FOLATE, FERRITIN, TIBC, IRON, RETICCTPCT in the last 72 hours. Urine analysis:    Component Value Date/Time   COLORURINE YELLOW 01/10/2019 [place] 01/10/2019 1525   LABSPEC 1.019 01/10/2019 1525   PHURINE 7.5 01/10/2019 1525   GLUCOSEU TRACE (A) 01/10/2019 1525   HGBUR NEGATIVE 01/10/2019 1525   BILIRUBINUR NEGATIVE 08/25/2018 0045   KETONESUR NEGATIVE 01/10/2019 1525   PROTEINUR 3+ (A) 01/10/2019 1525   UROBILINOGEN 0.2 05/30/2009 1500   NITRITE NEGATIVE 01/10/2019 1525   LEUKOCYTESUR NEGATIVE 01/10/2019 1525   Sepsis Labs: @LABRCNTIP (procalcitonin:4,lacticidven:4) ) Recent Results (from the past 240 hour(s))  Culture, blood (Routine x 2)     Status: None   Collection Time: 03/23/19  1:38 PM   Specimen: BLOOD  Result Value Ref Range Status   Specimen Description BLOOD LEFT ANTECUBITAL  Final   Special Requests   Final    BOTTLES DRAWN AEROBIC AND ANAEROBIC Blood Culture results may not be optimal due to an inadequate volume of blood received in culture bottles   Culture   Final    NO GROWTH 5 DAYS Performed at [facility], [place] [address] [place], [place] [address]    Report Status 03/28/2019 FINAL  Final  Culture, blood (Routine x 2)     Status: None   Collection Time: 03/23/19  3:38 PM   Specimen: BLOOD  Result Value Ref Range Status   Specimen Description BLOOD BLOOD LEFT FOREARM  Final   Special Requests   Final    BOTTLES DRAWN AEROBIC AND ANAEROBIC Blood Culture results may  not be optimal due to an excessive volume of blood received in culture bottles   Culture   Final    NO GROWTH 5 DAYS Performed at [facility], [place] [address], [place],  [address]    Report Status 03/28/2019 FINAL  Final  SARS CORONAVIRUS 2 (TAT 6-24 HRS) Nasopharyngeal Nasopharyngeal Swab  Status: None   Collection Time: 03/23/19  3:57 PM   Specimen: Nasopharyngeal Swab  Result Value Ref Range Status   SARS Coronavirus 2 NEGATIVE NEGATIVE Final    Comment: (NOTE) SARS-CoV-2 target nucleic acids are NOT DETECTED. The SARS-CoV-2 RNA is generally detectable in upper and lower respiratory specimens during the acute phase of infection. Negative results do not preclude SARS-CoV-2 infection, do not rule out co-infections with other pathogens, and should not be used as the sole basis for treatment or other patient management decisions. Negative results must be combined with clinical observations, patient history, and epidemiological information. The expected result is Negative. Fact Sheet for Patients: SugarRoll.be Fact Sheet for Healthcare Providers: https://www.woods-mathews.com/ This test is not yet approved or cleared by the [place] FDA and  has been authorized for detection and/or diagnosis of SARS-CoV-2 by FDA under an Emergency Use Authorization (EUA). This EUA will remain  in effect (meaning this test can be used) for the duration of the COVID-19 declaration under Section 56 4(b)(1) of the Act, 21 U.S.C. section 360bbb-3(b)(1), unless the authorization is terminated or revoked sooner. Performed at [facility], [place] [address], [place], [place] [address]   MRSA PCR Screening     Status: None   Collection Time: 03/24/19  6:10 AM   Specimen: Nasal Mucosa; Nasopharyngeal  Result Value Ref Range Status   MRSA by PCR NEGATIVE NEGATIVE Final    Comment:        The GeneXpert MRSA Assay (FDA approved for NASAL  specimens only), is one component of a comprehensive MRSA colonization surveillance program. It is not intended to diagnose MRSA infection nor to guide or monitor treatment for MRSA infections. Performed at [facility], [place] [address] [place], [place] [address]          Radiology Studies: No results found.    Scheduled Meds: . allopurinol  100 mg Oral BID  . aspirin EC  81 mg Oral Daily  . atorvastatin  10 mg Oral q1800  . Chlorhexidine Gluconate Cloth  6 each Topical Q0600  . cinacalcet  30 mg Oral Q supper  . darbepoetin (ARANESP) injection - DIALYSIS  60 mcg Intravenous Q Tue-HD  . doxercalciferol  4 mcg Intravenous Q T,Th,Sa-HD  . hydrALAZINE  25 mg Oral Q8H  . insulin aspart  0-15 Units Subcutaneous TID WC  . insulin aspart  0-5 Units Subcutaneous QHS  . insulin detemir  10 Units Subcutaneous Daily  . isosorbide dinitrate  10 mg Oral TID  . pantoprazole  40 mg Oral Daily  . sevelamer carbonate  1,600 mg Oral TID WC  . traZODone  25-100 mg Oral QHS   Continuous Infusions: . sodium chloride    . sodium chloride    . sodium chloride    . sodium chloride    . magnesium sulfate bolus IVPB    . piperacillin-tazobactam (ZOSYN)  IV Stopped (03/30/19 0344)  . vancomycin Stopped (03/29/19 1441)     LOS: 7 days      Debbe Odea, MD [facility] Pager: [email or web address] Password [email or web address] 03/30/2019, 9:08 AM

## 2019-03-31 LAB — CBC
HCT: 22.4 % — ABNORMAL LOW (ref 36.0–46.0)
Hemoglobin: 7.3 g/dL — ABNORMAL LOW (ref 12.0–15.0)
MCH: 31.5 pg (ref 26.0–34.0)
MCHC: 32.6 g/dL (ref 30.0–36.0)
MCV: 96.6 fL (ref 80.0–100.0)
Platelets: 221 10*3/uL (ref 150–400)
RBC: 2.32 MIL/uL — ABNORMAL LOW (ref 3.87–5.11)
RDW: 17.1 % — ABNORMAL HIGH (ref 11.5–15.5)
WBC: 10.9 10*3/uL — ABNORMAL HIGH (ref 4.0–10.5)
nRBC: 0.2 % (ref 0.0–0.2)

## 2019-03-31 LAB — RENAL FUNCTION PANEL
Albumin: 2.3 g/dL — ABNORMAL LOW (ref 3.5–5.0)
Anion gap: 15 (ref 5–15)
BUN: 38 mg/dL — ABNORMAL HIGH (ref 8–23)
CO2: 23 mmol/L (ref 22–32)
Calcium: 7.3 mg/dL — ABNORMAL LOW (ref 8.9–10.3)
Chloride: 96 mmol/L — ABNORMAL LOW (ref 98–111)
Creatinine, Ser: 4.37 mg/dL — ABNORMAL HIGH (ref 0.44–1.00)
GFR calc Af Amer: 11 mL/min — ABNORMAL LOW (ref >60)
GFR calc non Af Amer: 9 mL/min — ABNORMAL LOW (ref >60)
Glucose, Bld: 104 mg/dL — ABNORMAL HIGH (ref 70–99)
Phosphorus: 5 mg/dL — ABNORMAL HIGH (ref 2.5–4.6)
Potassium: 4 mmol/L (ref 3.5–5.1)
Sodium: 134 mmol/L — ABNORMAL LOW (ref 135–145)

## 2019-03-31 LAB — GLUCOSE, CAPILLARY
Glucose-Capillary: 100 mg/dL — ABNORMAL HIGH (ref 70–99)
Glucose-Capillary: 97 mg/dL (ref 70–99)
Glucose-Capillary: 201 mg/dL — ABNORMAL HIGH (ref 70–99)
Glucose-Capillary: 135 mg/dL — ABNORMAL HIGH (ref 70–99)

## 2019-03-31 MED ORDER — VANCOMYCIN HCL IN DEXTROSE 750-5 MG/150ML-% IV SOLN
INTRAVENOUS | Status: AC
  Administered 2019-03-31: 750 mg via INTRAVENOUS
  Filled 2019-03-31: qty 150

## 2019-03-31 MED ORDER — SODIUM CHLORIDE 0.9 % IV SOLN: 100.0000 mL | INTRAVENOUS | Status: DC | PRN

## 2019-03-31 MED ORDER — LIDOCAINE-PRILOCAINE 2.5-2.5 % EX CREA
1.0000 "application " | TOPICAL_CREAM | CUTANEOUS | Status: DC | PRN
  Filled 2019-03-31: qty 5

## 2019-03-31 MED ORDER — ALTEPLASE 2 MG IJ SOLR: 2.0000 mg | Freq: Once | INTRAMUSCULAR | Status: DC | PRN

## 2019-03-31 MED ORDER — HEPARIN SODIUM (PORCINE) 1000 UNIT/ML DIALYSIS
20.0000 [IU]/kg | INTRAMUSCULAR | Status: DC | PRN
  Filled 2019-03-31: qty 2

## 2019-03-31 MED ORDER — SENNOSIDES-DOCUSATE SODIUM 8.6-50 MG PO TABS: 1.0000 | ORAL_TABLET | Freq: Every evening | ORAL | Status: DC | PRN

## 2019-03-31 MED ORDER — HEPARIN SODIUM (PORCINE) 1000 UNIT/ML DIALYSIS
1000.0000 [IU] | INTRAMUSCULAR | Status: DC | PRN
  Filled 2019-03-31: qty 1

## 2019-03-31 MED ORDER — POLYETHYLENE GLYCOL 3350 17 G PO PACK
17.0000 g | PACK | Freq: Every day | ORAL | Status: DC
  Administered 2019-03-31 – 2019-04-07 (×8): 17 g via ORAL
  Filled 2019-03-31 (×8): qty 1

## 2019-03-31 MED ORDER — PENTAFLUOROPROP-TETRAFLUOROETH EX AERO: 1.0000 "application " | INHALATION_SPRAY | CUTANEOUS | Status: DC | PRN

## 2019-03-31 MED ORDER — DOXERCALCIFEROL 4 MCG/2ML IV SOLN
INTRAVENOUS | Status: AC
  Administered 2019-03-31: 4 ug via INTRAVENOUS
  Filled 2019-03-31: qty 2

## 2019-03-31 MED ORDER — LIDOCAINE HCL (PF) 1 % IJ SOLN: 5.0000 mL | INTRAMUSCULAR | Status: DC | PRN

## 2019-03-31 MED ORDER — HYDROMORPHONE HCL 1 MG/ML IJ SOLN
INTRAMUSCULAR | Status: AC
  Filled 2019-03-31: qty 0.5

## 2019-03-31 NOTE — Progress Notes (Signed)
[facility] Progress Note   Dialysis Orders: [facility], TTS, 3.75 hours EDW 72 kg, BFR 350 with 16 g needles , DFR 800, Optiflux 180, 3K/2.25 calcium, vancomycin 750 mg q. HD, [name] 50 mcg every 2 weeks, Hectorol 4 mcg IV 3 times weekly, heparin 5000 unit bolus.  Right radiocephalic fistula. 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 s/p angiogram by Dr. Carlis Abbott on 12/18. Status post left femoral endarterectomy with left femoropopliteal bypass 01/26/2019. Ortho/Dr. Sharol Given may have surgical intervention pending improvement after vascular procedure but not planned this admission. On Vanc and Zosyn.  2. ESRD- on HD TTS.  Net UF 2 L Tuesday with post wt not done but based on pre wt would be about 72 at EDW K 4.9 Tuesday - start on 2 K bath Thursday - normally on 3 K bath -will run at 3.5 hr due to high inpatient census - next HD Sunday due to holiday schedule- labs pending 3. Anemiaof CKD-Hgb8 sig drop likely due to surgery  Aranesp 60 given Tuesday- may have some ESA resistance due to infection. - check Fe studies with HD today - labs pending 4. Secondary hyperparathyroidism- Corr Calcium borderline low and phos in goal. Continue hectorol, sensipar and renvela 2AC labs pending 5.HTN/volumeBP ok - at EDW - goal today 2 L - BP may not allow 6. Nutrition- Renal diet w/fluid restrictions. Alb low- added nepro and renavite - not eating much 7. DM - per primary 8. Constipation - no BM since last week - add sennakot/ and miralax 9. Disp - planning CIR admission - seems like a good candidate  Pamela Jacobson, PA-C [place] [phone] 03/31/2019,7:33 AM  LOS: 8 days   Subjective:   No issues with HD yesterday - feels much better today than yesterday but overall weak and a little tremulous.  Planning for CIR admission  Objective Vitals:   03/30/19 0902 03/30/19 1543  03/30/19 1947 03/31/19 0502  BP: 111/63 (!) 112/57 (!) 118/50 (!) 112/48  Pulse: 84 86 85 85  Resp: 15 20 16 19   Temp: 97.6 F (36.4 C) 98 F (36.7 C) 98.7 F (37.1 C) 98.9 F (37.2 C)  TempSrc: Oral Oral Oral Oral  SpO2: 100% 100% 99% 98%  Weight:    70.8 kg  Height:       Physical Exam goal 2 L General: NAD on HD Heart: RRR Lungs: no rales Abdomen: soft NT somewhat distended Extremities: tr LE edema - leg incisions clear - foot not examined Dialysis Access: right lower AVF + bruit Qb 350  Additional Objective Labs: Basic Metabolic Panel: Recent Labs  Lab 03/24/19 1407 03/25/19 1000 03/26/19 2013 03/28/19 0329 03/29/19 0508  NA 135 135 133* 137 134*  K 4.5 5.6* 3.9 3.4* 4.9  CL 96* 96* 95* 94* 97*  CO2 25 21* 25 26 21*  GLUCOSE 230* 172* 151* 180* 172*  BUN 63* 71* 48* 41* 51*  CREATININE 4.29* 4.76* 3.52* 3.56* 4.21*  CALCIUM 8.1* 8.2* 7.7* 8.0* 7.0*  PHOS 4.5 5.3* 4.3  --   --    Liver Function Tests: Recent Labs  Lab 03/24/19 1407 03/25/19 1000 03/26/19 2013  ALBUMIN 2.7* 2.8* 2.5*   No results for input(s): LIPASE, AMYLASE in the last 168 hours. CBC: Recent Labs  Lab 03/25/19 0500 03/26/19 2013 03/28/19 0329 03/29/19 0508  WBC 8.2 10.8* 8.5 11.5*  HGB 11.6* 10.3* 12.7 8.0*  HCT 36.3 31.0* 39.0 24.7*  MCV 94.0 93.7 94.4 95.4  PLT 252 266 298 238   Blood Culture    Component Value Date/Time   SDES BLOOD BLOOD LEFT FOREARM 03/23/2019 1538   SPECREQUEST  03/23/2019 1538    BOTTLES DRAWN AEROBIC AND ANAEROBIC Blood Culture results may not be optimal due to an excessive volume of blood received in culture bottles   CULT  03/23/2019 1538    NO GROWTH 5 DAYS Performed at [facility], [place] [address] [place], [place] [address]    REPTSTATUS 03/28/2019 FINAL 03/23/2019 1538    Cardiac Enzymes: No results for input(s): CKTOTAL, CKMB, CKMBINDEX, TROPONINI in the last 168 hours. CBG: Recent Labs  Lab 03/30/19 0607 03/30/19 1231  03/30/19 1617 03/30/19 2056 03/31/19 0610  GLUCAP 114* 156* 153* 104* 100*   Iron Studies: No results for input(s): IRON, TIBC, TRANSFERRIN, FERRITIN in the last 72 hours. Lab Results  Component Value Date   INR 1.1 03/28/2019   INR 1.2 03/23/2019   INR 1.17 11/30/2017   Studies/Results: VAS [name] ABI WITH/WO TBI  Result Date: 03/30/2019 LOWER EXTREMITY DOPPLER STUDY Indications: Left femoral popliteal bypass. High Risk Factors: Hyperlipidemia, Diabetes.  Comparison Study: 07/19/18 unreliable ABIs, no pressures documented Performing Technologist: June Leap Rdms, Rvt  Examination Guidelines: A complete evaluation includes at minimum, Doppler waveform signals and systolic blood pressure reading at the level of bilateral brachial, anterior tibial, and posterior tibial arteries, when vessel segments are accessible. Bilateral testing is considered an integral part of a complete examination. Photoelectric Plethysmograph (PPG) waveforms and toe systolic pressure readings are included as required and additional duplex testing as needed. Limited examinations for reoccurring indications may be performed as noted.  ABI Findings: +--------+------------------+-----+----------+----------+ Right   Rt Pressure (mmHg)IndexWaveform  Comment    +--------+------------------+-----+----------+----------+ Brachial                       triphasic AVF in arm +--------+------------------+-----+----------+----------+ ATA     152               1.18 monophasic           +--------+------------------+-----+----------+----------+ PTA     255               1.98 monophasic           +--------+------------------+-----+----------+----------+ +--------+------------------+-----+----------+-------+ Left    Lt Pressure (mmHg)IndexWaveform  Comment +--------+------------------+-----+----------+-------+ [id number]                    triphasic         +--------+------------------+-----+----------+-------+  ATA     95                0.74 monophasic        +--------+------------------+-----+----------+-------+ PTA     255               1.98 monophasic        +--------+------------------+-----+----------+-------+  Summary: Right: Resting right ankle-brachial index indicates noncompressible right lower extremity arteries. Left: Resting left ankle-brachial index indicates noncompressible left lower extremity arteries.  *See table(s) above for measurements and observations.  Electronically signed by Curt Jews MD on 03/30/2019 at 4:04:24 PM.   Final    Medications: . sodium chloride    . sodium chloride    . sodium chloride    . magnesium sulfate bolus IVPB    . piperacillin-tazobactam (ZOSYN)  IV 2.25 g (03/31/19 0154)  . vancomycin Stopped (03/29/19 1441)   . allopurinol  100 mg Oral BID  . aspirin EC  81 mg Oral Daily  . atorvastatin  10 mg Oral q1800  . Chlorhexidine Gluconate Cloth  6 each Topical Q0600  . cinacalcet  30 mg Oral Q supper  . darbepoetin (ARANESP) injection - DIALYSIS  60 mcg Intravenous Q Tue-HD  . doxercalciferol  4 mcg Intravenous Q T,Th,Sa-HD  . feeding supplement (NEPRO CARB STEADY)  237 mL Oral BID BM  . hydrALAZINE  25 mg Oral Q8H  . insulin aspart  0-15 Units Subcutaneous TID WC  . insulin aspart  0-5 Units Subcutaneous QHS  . insulin detemir  10 Units Subcutaneous Daily  . isosorbide dinitrate  10 mg Oral TID  . multivitamin  1 tablet Oral QHS  . pantoprazole  40 mg Oral Daily  . sevelamer carbonate  1,600 mg Oral TID WC  . traZODone  25-100 mg Oral QHS

## 2019-03-31 NOTE — Progress Notes (Signed)
Physical Therapy Treatment Patient Details Name: [name] MRN: [id number] DOB: September 26, 1945 Today's Date: 03/31/2019    History of Present Illness Pt is a 73 y.o. female admitted 03/23/19 from wound care center due to infected L foot wound that failed outpatient antibiotics. S/p L CFA endarterectomy and femoral to BK bypass on 12/21. Potential for surgical intervention of L foot pending vascular intervention status. PMH includes CAD, CHF/ICM, CKD IV, HTN, DM, PAD (bilateral SFA occlusion 07/2018), L toe osteomyelitis.   PT Comments    Pt slowly progressing with mobility. Improved tolerance of LLE ROM and slight increase in ability to WBAT through LLE with standing. Pt remains significantly limited by pain, requiring mod-maxA to stand and attempt steps with RW. Motivated to participate and regain PLOF. Per chart, plans to d/c to SNF for rehab awaiting potential for additional sx on LLE, then maybe CIR.    Follow Up Recommendations  CIR;Supervision for mobility/OOB(pt plans for SNF)     Equipment Recommendations  (defer)    Recommendations for Other Services       Precautions / Restrictions Precautions Precautions: Fall Restrictions Weight Bearing Restrictions: No Other Position/Activity Restrictions: Difficulty WB through LLE due to pain    Mobility  Bed Mobility Overal bed mobility: Needs Assistance Bed Mobility: Supine to Sit;Sit to Supine     Supine to sit: Mod assist Sit to supine: Mod assist   General bed mobility comments: Improved ability to maneuver LLE to EOB with minA, modA for trunk elevation; modA for LEs return to supine  Transfers Overall transfer level: Needs assistance Equipment used: Rolling walker (2 wheeled);1 person hand held assist Transfers: Sit to/from Stand Sit to Stand: Max assist;From elevated surface         General transfer comment: Stood to RW reliant on UE support of therapist and maxA to elevate trunk, increased time to stablize and  correct posterior lean, increased time to WB through LLE  Ambulation/Gait Ambulation/Gait assistance: Mod assist Gait Distance (Feet): 1 Feet Assistive device: Rolling walker (2 wheeled) Gait Pattern/deviations: Step-to pattern;Decreased weight shift to left;Antalgic;Leaning posteriorly;Trunk flexed Gait velocity: Decreased   General Gait Details: Pt able to take side steps towards HOB with RW, requiring modA to maintain balance when WB through LLE in order to take step with R foot. Able to take complete step with R foot, reliant on sliding L foot   Stairs             Wheelchair Mobility    Modified Rankin (Stroke Patients Only)       Balance Overall balance assessment: Needs assistance Sitting-balance support: Feet supported;No upper extremity supported Sitting balance-Leahy Scale: Fair     Standing balance support: Bilateral upper extremity supported;During functional activity Standing balance-Leahy Scale: Poor Standing balance comment: Heavy reliance on UE support; reliant on additional external assist for dynamic standing balance                            Cognition Arousal/Alertness: Awake/alert Behavior During Therapy: WFL for tasks assessed/performed Overall Cognitive Status: Impaired/Different from baseline Area of Impairment: Attention;Problem solving;Memory                   Current Attention Level: Selective Memory: Decreased short-term memory       Problem Solving: Requires verbal cues General Comments: Pt aware of apparent cognitive deficits today, increased fatigue secondary to HD this AM and distracted by pain      Exercises  Other Exercises Other Exercises: LLE gastroc & soleus stress (passively with assist from PT), LAQ    General Comments        Pertinent Vitals/Pain Pain Assessment: Faces Faces Pain Scale: Hurts whole lot Pain Location: LLE, lower back Pain Descriptors / Indicators: Spasm;Sharp;Grimacing Pain  Intervention(s): Monitored during session;RN gave pain meds during session    Home Living                      Prior Function            PT Goals (current goals can now be found in the care plan section) Progress towards PT goals: Progressing toward goals    Frequency    Min 2X/week      PT Plan Discharge plan needs to be updated;Frequency needs to be updated    Co-evaluation              AM-PAC PT "6 Clicks" Mobility   Outcome Measure  Help needed turning from your back to your side while in a flat bed without using bedrails?: A Lot Help needed moving from lying on your back to sitting on the side of a flat bed without using bedrails?: A Lot Help needed moving to and from a bed to a chair (including a wheelchair)?: A Lot Help needed standing up from a chair using your arms (e.g., wheelchair or bedside chair)?: A Lot Help needed to walk in hospital room?: A Lot Help needed climbing 3-5 steps with a railing? : Total 6 Click Score: 11    End of Session Equipment Utilized During Treatment: Gait belt Activity Tolerance: Patient tolerated treatment well;Patient limited by pain Patient left: in bed;with call bell/phone within reach;with bed alarm set Nurse Communication: Mobility status PT Visit Diagnosis: Other abnormalities of gait and mobility (R26.89);Muscle weakness (generalized) (M62.81);Pain Pain - Right/Left: Left Pain - part of body: Leg     Time: 2979-8921 PT Time Calculation (min) (ACUTE ONLY): 39 min  Charges:  $Therapeutic Exercise: 8-22 mins $Therapeutic Activity: 23-37 mins                    Mabeline Caras, PT, DPT Acute Rehabilitation Services  Pager [phone] Office [place] 03/31/2019, 5:45 PM

## 2019-03-31 NOTE — Progress Notes (Signed)
PROGRESS NOTE    [name]   QPY:[id number]  DOB: 04-Jan-1946  DOA: 03/23/2019 PCP: Unk Pinto, MD   Brief Narrative:  [name] 73 year old married female, independent, PMH of CAD s/p CABG, chronic combined CHF/ICM, stage IV CKD (baseline creatinine 2.7-3.1), HTN, HLD, DM, lower extremity PAD (bilateral SFA occlusion 07/2018), chronic bilateral feet ulcerations, osteomyelitis of left toe, LBBB who has had a chronic wound on her left foot that is followed at the wound care center closely, recently noted worsening infection and started on IV antibiotics including vancomycin at dialysis (reportedly completed 3 doses) sent to [facility] from the wound care center due to low-grade fevers and increased drainage from the ulcer.  Admitted for left little toe osteomyelitis, abscess and sepsis complicating PAD.  Nephrology consulted for HD needs.  S/p aortogram with bilateral lower extremity arteriogram with runoff 12/18 followed by left CFA endarterectomy and Dacron patch angioplasty, left femoral to below-knee popliteal bypass graft on 12/21.  Orthopedics/Dr. Sharol Given plans surgical intervention after vascular procedure.    Subjective: No complaints. Seen in dialysis.     Assessment & Plan:   Principal Problem:   Osteomyelitis-septic arthritis left fifth metatarsal joint, abscess and ulcer in setting of Peripheral artery disease -Patient has failed outpatient antibiotic treatment and has been started on vancomycin and Zosyn in the hospital -Outpatient wound culture grew out Serratia-blood cultures have been negative -5/15-MRI left foot: 1. Septic arthritis at the fifth MTP joint with osteomyelitis involving the distal half of the fifth metatarsal and also the proximal phalanx. 2. Surrounding inflammatory phlegmon and abscess. 3. Diffuse cellulitis and myofasciitis.-12/18-lower extremity arteriogram revealed 70% stenosis of the left common femoral artery with heavily  calcified plaque, SFA occluded throughout proximal mid and distal segment-50% right common femoral artery stenosis and right SFA occlusion throughout the entire segment- - 12/21-patient underwent left CFA endarterectomy and Dacron patch angioplasty and left below the knee popliteal bypass graft -Orthopedic surgery team does not plan on doing any further procedures in the hospital and will follow up on the patient in about a week in the office - plan to d/c to SNF with antibiotics with dialysis  Active Problems: Ulcers on right leg and foot -These are suspected to be ischemic ulcers -The patient states that these have been present for many months and apparently are healing slowly -She follows as outpatient with the wound care center    Essential hypertension -Continue current medications which include hydralazine and isosorbide dinitrate    Diabetes mellitus type 2, insulin dependent  -Last A1c was 6.3-at home she takes 70/30 insulin Continue Levemir and sliding scale insulin in the hospital    ESRD on hemodialysis  -Receives dialysis on Tuesday Thursday Saturday and received at this morning  Chronic systolic heart failure -Fluid management is with dialysis  Gout -Continue allopurinol  Anemia of chronic disease -Stable   Time spent in minutes: 35 DVT prophylaxis: SCDs Code Status: Full code Family Communication: None at bedside Disposition Plan: -continue to treat infection- awaiting SNF Consultants:   Vascular surgery  Orthopedic surgery  Nephrology Procedures:   Dr. Carlis Abbott, VVS S/p aortogram with bilateral lower extremity arteriogram with runoff 12/18.   Dr. Donnetta Hutching, VVS S/P left CFA endarterectomy and Dacron patch angioplasty, left femoral to below-knee popliteal bypass graft on 12/21.  HD Antimicrobials:  Anti-infectives (From admission, onward)   Start     Dose/Rate Route Frequency Ordered Stop   03/31/19 0000  Vancomycin (VANCOCIN) 750-5 MG/150ML-% SOLN  750  mg Intravenous Every T-Th-Sa (Hemodialysis) 03/30/19 1211     03/30/19 0000  piperacillin-tazobactam (ZOSYN) 2-0.25 GM/50ML IVPB     2.25 g Intravenous Every 8 hours 03/30/19 1211     03/29/19 1200  vancomycin (VANCOCIN) IVPB 750 mg/150 ml premix     750 mg 150 mL/hr over 60 Minutes Intravenous Every T-Th-Sa (Hemodialysis) 03/26/19 2303     03/28/19 0600  cefUROXime (ZINACEF) 1.5 g in sodium chloride 0.9 % 100 mL IVPB     1.5 g 200 mL/hr over 30 Minutes Intravenous On call to O.R. 03/27/19 1132 03/28/19 1453   03/26/19 2359  vancomycin (VANCOREADY) IVPB 500 mg/100 mL     500 mg 100 mL/hr over 60 Minutes Intravenous  Once 03/26/19 2303 03/27/19 0716   03/25/19 1730  vancomycin (VANCOCIN) IVPB 750 mg/150 ml premix     750 mg 150 mL/hr over 60 Minutes Intravenous  Once 03/25/19 1513 03/25/19 1627   03/24/19 1800  piperacillin-tazobactam (ZOSYN) IVPB 2.25 g     2.25 g 100 mL/hr over 30 Minutes Intravenous Every 8 hours 03/24/19 1631     03/24/19 0000  vancomycin variable dose per unstable renal function (pharmacist dosing)  Status:  Discontinued      Does not apply See admin instructions 03/23/19 1528 03/29/19 0720   03/23/19 1530  vancomycin (VANCOREADY) IVPB 1500 mg/300 mL     1,500 mg 150 mL/hr over 120 Minutes Intravenous  Once 03/23/19 1524 03/23/19 1949   03/23/19 1515  piperacillin-tazobactam (ZOSYN) IVPB 3.375 g     3.375 g 100 mL/hr over 30 Minutes Intravenous  Once 03/23/19 1504 03/23/19 1556       Objective: Vitals:   03/31/19 0900 03/31/19 0930 03/31/19 1000 03/31/19 1030  BP: (!) 96/42 (!) 102/46 (!) 108/50 (!) 120/59  Pulse: 74 75 74 79  Resp:      Temp:      TempSrc:      SpO2:      Weight:      Height:        Intake/Output Summary (Last 24 hours) at 03/31/2019 1139 Last data filed at 03/30/2019 1300 Gross per 24 hour  Intake 240 ml  Output --  Net 240 ml   Filed Weights   03/30/19 0427 03/31/19 0502 03/31/19 0649  Weight: 73.4 kg 70.8 kg 71.9 kg     Examination: General exam: Appears comfortable  HEENT: PERRLA, oral mucosa moist, no sclera icterus or thrush Respiratory system: Clear to auscultation. Respiratory effort normal. Cardiovascular system: S1 & S2 heard,  No murmurs  Gastrointestinal system: Abdomen soft, non-tender, nondistended. Normal bowel sounds   Central nervous system: Alert and oriented. No focal neurological deficits. Extremities: No cyanosis, clubbing or edema Skin: No rashes - ulcers noted on b/l LE Psychiatry:  Mood & affect appropriate.     Data Reviewed: I have personally reviewed following labs and imaging studies  CBC: Recent Labs  Lab 03/25/19 0500 03/26/19 2013 03/28/19 0329 03/29/19 0508 03/31/19 0715  WBC 8.2 10.8* 8.5 11.5* 10.9*  HGB 11.6* 10.3* 12.7 8.0* 7.3*  HCT 36.3 31.0* 39.0 24.7* 22.4*  MCV 94.0 93.7 94.4 95.4 96.6  PLT 252 266 298 238 865   Basic Metabolic Panel: Recent Labs  Lab 03/24/19 1407 03/25/19 1000 03/26/19 2013 03/28/19 0329 03/29/19 0508 03/31/19 0715  NA 135 135 133* 137 134* 134*  K 4.5 5.6* 3.9 3.4* 4.9 4.0  CL 96* 96* 95* 94* 97* 96*  CO2 25 21* 25 26  21* 23  GLUCOSE 230* 172* 151* 180* 172* 104*  BUN 63* 71* 48* 41* 51* 38*  CREATININE 4.29* 4.76* 3.52* 3.56* 4.21* 4.37*  CALCIUM 8.1* 8.2* 7.7* 8.0* 7.0* 7.3*  PHOS 4.5 5.3* 4.3  --   --  5.0*   GFR: Estimated Creatinine Clearance: 11.1 mL/min (A) (by C-G formula based on SCr of 4.37 mg/dL (H)). Liver Function Tests: Recent Labs  Lab 03/24/19 1407 03/25/19 1000 03/26/19 2013 03/31/19 0715  ALBUMIN 2.7* 2.8* 2.5* 2.3*   No results for input(s): LIPASE, AMYLASE in the last 168 hours. No results for input(s): AMMONIA in the last 168 hours. Coagulation Profile: Recent Labs  Lab 03/28/19 0329  INR 1.1   Cardiac Enzymes: No results for input(s): CKTOTAL, CKMB, CKMBINDEX, TROPONINI in the last 168 hours. BNP (last 3 results) No results for input(s): PROBNP in the last 8760  hours. HbA1C: No results for input(s): HGBA1C in the last 72 hours. CBG: Recent Labs  Lab 03/30/19 0607 03/30/19 1231 03/30/19 1617 03/30/19 2056 03/31/19 0610  GLUCAP 114* 156* 153* 104* 100*   Lipid Profile: No results for input(s): CHOL, HDL, LDLCALC, TRIG, CHOLHDL, LDLDIRECT in the last 72 hours. Thyroid Function Tests: No results for input(s): TSH, T4TOTAL, FREET4, T3FREE, THYROIDAB in the last 72 hours. Anemia Panel: No results for input(s): VITAMINB12, FOLATE, FERRITIN, TIBC, IRON, RETICCTPCT in the last 72 hours. Urine analysis:    Component Value Date/Time   COLORURINE YELLOW 01/10/2019 [place] 01/10/2019 1525   LABSPEC 1.019 01/10/2019 1525   PHURINE 7.5 01/10/2019 1525   GLUCOSEU TRACE (A) 01/10/2019 1525   HGBUR NEGATIVE 01/10/2019 1525   BILIRUBINUR NEGATIVE 08/25/2018 0045   KETONESUR NEGATIVE 01/10/2019 1525   PROTEINUR 3+ (A) 01/10/2019 1525   UROBILINOGEN 0.2 05/30/2009 1500   NITRITE NEGATIVE 01/10/2019 1525   LEUKOCYTESUR NEGATIVE 01/10/2019 1525   Sepsis Labs: @LABRCNTIP (procalcitonin:4,lacticidven:4) ) Recent Results (from the past 240 hour(s))  Culture, blood (Routine x 2)     Status: None   Collection Time: 03/23/19  1:38 PM   Specimen: BLOOD  Result Value Ref Range Status   Specimen Description BLOOD LEFT ANTECUBITAL  Final   Special Requests   Final    BOTTLES DRAWN AEROBIC AND ANAEROBIC Blood Culture results may not be optimal due to an inadequate volume of blood received in culture bottles   Culture   Final    NO GROWTH 5 DAYS Performed at [facility], [place] [address], [place], [place] [address]    Report Status 03/28/2019 FINAL  Final  Culture, blood (Routine x 2)     Status: None   Collection Time: 03/23/19  3:38 PM   Specimen: BLOOD  Result Value Ref Range Status   Specimen Description BLOOD BLOOD LEFT FOREARM  Final   Special Requests   Final    BOTTLES DRAWN AEROBIC AND ANAEROBIC Blood Culture results may  not be optimal due to an excessive volume of blood received in culture bottles   Culture   Final    NO GROWTH 5 DAYS Performed at [facility], [place] [address], [name], [place] [address]    Report Status 03/28/2019 FINAL  Final  SARS CORONAVIRUS 2 (TAT 6-24 HRS) Nasopharyngeal Nasopharyngeal Swab     Status: None   Collection Time: 03/23/19  3:57 PM   Specimen: Nasopharyngeal Swab  Result Value Ref Range Status   SARS Coronavirus 2 NEGATIVE NEGATIVE Final    Comment: (NOTE) SARS-CoV-2 target nucleic  acids are NOT DETECTED. The SARS-CoV-2 RNA is generally detectable in upper and lower respiratory specimens during the acute phase of infection. Negative results do not preclude SARS-CoV-2 infection, do not rule out co-infections with other pathogens, and should not be used as the sole basis for treatment or other patient management decisions. Negative results must be combined with clinical observations, patient history, and epidemiological information. The expected result is Negative. Fact Sheet for Patients: SugarRoll.be Fact Sheet for Healthcare Providers: https://www.woods-mathews.com/ This test is not yet approved or cleared by the [place] FDA and  has been authorized for detection and/or diagnosis of SARS-CoV-2 by FDA under an Emergency Use Authorization (EUA). This EUA will remain  in effect (meaning this test can be used) for the duration of the COVID-19 declaration under Section 56 4(b)(1) of the Act, 21 U.S.C. section 360bbb-3(b)(1), unless the authorization is terminated or revoked sooner. Performed at [facility], [place] [address], [place], [place] [address]   MRSA PCR Screening     Status: None   Collection Time: 03/24/19  6:10 AM   Specimen: Nasal Mucosa; Nasopharyngeal  Result Value Ref Range Status   MRSA by PCR NEGATIVE NEGATIVE Final    Comment:        The GeneXpert MRSA Assay (FDA approved for NASAL  specimens only), is one component of a comprehensive MRSA colonization surveillance program. It is not intended to diagnose MRSA infection nor to guide or monitor treatment for MRSA infections. Performed at [facility], [place] [address], [place], [place] [address]          Radiology Studies: VAS [name] ABI WITH/WO TBI  Result Date: 03/30/2019 LOWER EXTREMITY DOPPLER STUDY Indications: Left femoral popliteal bypass. High Risk Factors: Hyperlipidemia, Diabetes.  Comparison Study: 07/19/18 unreliable ABIs, no pressures documented Performing Technologist: June Leap Rdms, Rvt  Examination Guidelines: A complete evaluation includes at minimum, Doppler waveform signals and systolic blood pressure reading at the level of bilateral brachial, anterior tibial, and posterior tibial arteries, when vessel segments are accessible. Bilateral testing is considered an integral part of a complete examination. Photoelectric Plethysmograph (PPG) waveforms and toe systolic pressure readings are included as required and additional duplex testing as needed. Limited examinations for reoccurring indications may be performed as noted.  ABI Findings: +--------+------------------+-----+----------+----------+ Right   Rt Pressure (mmHg)IndexWaveform  Comment    +--------+------------------+-----+----------+----------+ Brachial                       triphasic AVF in arm +--------+------------------+-----+----------+----------+ ATA     152               1.18 monophasic           +--------+------------------+-----+----------+----------+ PTA     255               1.98 monophasic           +--------+------------------+-----+----------+----------+ +--------+------------------+-----+----------+-------+ Left    Lt Pressure (mmHg)IndexWaveform  Comment +--------+------------------+-----+----------+-------+ [id number]                    triphasic          +--------+------------------+-----+----------+-------+ ATA     95                0.74 monophasic        +--------+------------------+-----+----------+-------+ PTA     255               1.98 monophasic        +--------+------------------+-----+----------+-------+  Summary: Right: Resting right ankle-brachial index indicates noncompressible right lower extremity arteries. Left: Resting left ankle-brachial index indicates noncompressible left lower extremity arteries.  *See table(s) above for measurements and observations.  Electronically signed by Curt Jews MD on 03/30/2019 at 4:04:24 PM.   Final       Scheduled Meds: . allopurinol  100 mg Oral BID  . aspirin EC  81 mg Oral Daily  . atorvastatin  10 mg Oral q1800  . Chlorhexidine Gluconate Cloth  6 each Topical Q0600  . cinacalcet  30 mg Oral Q supper  . darbepoetin (ARANESP) injection - DIALYSIS  60 mcg Intravenous Q Tue-HD  . doxercalciferol  4 mcg Intravenous Q T,Th,Sa-HD  . feeding supplement (NEPRO CARB STEADY)  237 mL Oral BID BM  . hydrALAZINE  25 mg Oral Q8H  . HYDROmorphone      . insulin aspart  0-15 Units Subcutaneous TID WC  . insulin aspart  0-5 Units Subcutaneous QHS  . insulin detemir  10 Units Subcutaneous Daily  . isosorbide dinitrate  10 mg Oral TID  . multivitamin  1 tablet Oral QHS  . pantoprazole  40 mg Oral Daily  . polyethylene glycol  17 g Oral Daily  . sevelamer carbonate  1,600 mg Oral TID WC  . traZODone  25-100 mg Oral QHS   Continuous Infusions: . sodium chloride    . magnesium sulfate bolus IVPB    . piperacillin-tazobactam (ZOSYN)  IV 2.25 g (03/31/19 0154)  . vancomycin 750 mg (03/31/19 1015)     LOS: 8 days      Debbe Odea, MD [facility] Pager: [email or web address] Password [facility] 03/31/2019, 11:39 AM

## 2019-03-31 NOTE — Progress Notes (Signed)
  Progress Note    03/31/2019 8:00 AM 3 Days Post-Op  Subjective:  Seen during HD treatment   Vitals:   03/31/19 0705 03/31/19 0730  BP: (!) 110/55 (!) 118/59  Pulse: 85 89  Resp:    Temp:    SpO2:     Physical Exam: Lungs:  Non labored Incisions:  Incisions of LLE all c/d/i Extremities:  L foot warm and well perfused Neurologic: A&O  CBC    Component Value Date/Time   WBC 10.9 (H) 03/31/2019 0715   RBC 2.32 (L) 03/31/2019 0715   HGB 7.3 (L) 03/31/2019 0715   HGB 11.7 05/18/2017 1218   HGB 12.3 02/02/2017 1136   HCT 22.4 (L) 03/31/2019 0715   HCT 34.7 05/18/2017 1218   HCT 37.4 02/02/2017 1136   PLT 221 03/31/2019 0715   PLT 227 05/18/2017 1218   MCV 96.6 03/31/2019 0715   MCV 87 05/18/2017 1218   MCV 90.6 02/02/2017 1136   MCH 31.5 03/31/2019 0715   MCHC 32.6 03/31/2019 0715   RDW 17.1 (H) 03/31/2019 0715   RDW 16.0 (H) 05/18/2017 1218   RDW 15.7 (H) 02/02/2017 1136   LYMPHSABS 1.5 03/23/2019 1355   LYMPHSABS 1.8 02/02/2017 1136   MONOABS 1.1 (H) 03/23/2019 1355   MONOABS 0.8 02/02/2017 1136   EOSABS 0.1 03/23/2019 1355   EOSABS 0.3 02/02/2017 1136   BASOSABS 0.0 03/23/2019 1355   BASOSABS 0.1 02/02/2017 1136    BMET    Component Value Date/Time   NA 134 (L) 03/29/2019 0508   NA 143 05/18/2017 1218   NA 143 02/02/2017 1137   K 4.9 03/29/2019 0508   K 3.8 02/02/2017 1137   CL 97 (L) 03/29/2019 0508   CO2 21 (L) 03/29/2019 0508   CO2 24 02/02/2017 1137   GLUCOSE 172 (H) 03/29/2019 0508   GLUCOSE 74 02/02/2017 1137   BUN 51 (H) 03/29/2019 0508   BUN 76 (HH) 05/18/2017 1218   BUN 63.4 (H) 02/02/2017 1137   CREATININE 4.21 (H) 03/29/2019 0508   CREATININE 3.17 (H) 01/10/2019 1525   CREATININE 3.0 (HH) 02/02/2017 1137   CALCIUM 7.0 (L) 03/29/2019 0508   CALCIUM 9.9 02/02/2017 1137   GFRNONAA 10 (L) 03/29/2019 0508   GFRNONAA 14 (L) 01/10/2019 1525   GFRAA 11 (L) 03/29/2019 0508   GFRAA 16 (L) 01/10/2019 1525    INR    Component Value  Date/Time   INR 1.1 03/28/2019 0329     Intake/Output Summary (Last 24 hours) at 03/31/2019 0800 Last data filed at 03/30/2019 1300 Gross per 24 hour  Intake 240 ml  Output -  Net 240 ml     Assessment/Plan:  73 y.o. female is s/p L CFA endarterectomy and femoral to BK pop bypass with vein 3 Days Post-Op   LLE warm and well perfused Pain better controlled PT/OT recommending SNF Ok for d/c to SNF when bed available and approved Office arranged follow up appt on 04/19/19   Dagoberto Ligas, PA-C [facility] [phone] 03/31/2019 8:00 AM

## 2019-03-31 NOTE — Progress Notes (Signed)
PT Cancellation Note  Patient Details Name: [name] MRN: [id number] DOB: [birth year]   Cancelled Treatment:    Reason Eval/Treat Not Completed: Patient at procedure or test/unavailable (HD). Will follow-up as schedule permits.  Mabeline Caras, PT, DPT Acute Rehabilitation Services  Pager [phone] Office [place] 03/31/2019, 9:04 AM

## 2019-04-01 LAB — GLUCOSE, CAPILLARY
Glucose-Capillary: 133 mg/dL — ABNORMAL HIGH (ref 70–99)
Glucose-Capillary: 107 mg/dL — ABNORMAL HIGH (ref 70–99)
Glucose-Capillary: 139 mg/dL — ABNORMAL HIGH (ref 70–99)
Glucose-Capillary: 118 mg/dL — ABNORMAL HIGH (ref 70–99)
Glucose-Capillary: 163 mg/dL — ABNORMAL HIGH (ref 70–99)

## 2019-04-01 MED ORDER — SODIUM CHLORIDE 0.9 % IV SOLN
1.0000 g | INTRAVENOUS | Status: DC
  Administered 2019-04-01 – 2019-04-06 (×6): 1 g via INTRAVENOUS
  Filled 2019-04-01 (×7): qty 1

## 2019-04-01 MED ORDER — BISACODYL 10 MG RE SUPP
10.0000 mg | Freq: Once | RECTAL | Status: AC
  Administered 2019-04-01: 10 mg via RECTAL
  Filled 2019-04-01: qty 1

## 2019-04-01 NOTE — Progress Notes (Signed)
Patient ID: [name], female   DOB: 1945-07-10, 73 y.o.   MRN: [id number]  [facility] Progress Note   Assessment/ Plan:   1. 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 The plan is noted for possible admission to CIR to undergo PT/OT and ongoing monitoring for additional surgical intervention at some point later. On IV Vancomycin and Zosyn.  2. ESRD: Underwent dialysis yesterday without problems and will undergo her next dialysis session on 12/27 per holiday schedule.  3. Anemia:With post-op hemoglobin drop noted, continue ESA and check iron studies today.  4. CKD-MBD:Continue renal diet with sevelamer for phosphorus binding- phosphorus at goal. On hectorol/cinacalcet for PTH suppression.  5. Nutrition:Continue renal diet/MVI/ONS.  6. Hypertension:Blood pressures under acceptable control on HD/UF and current medications.  Subjective:   Reports occasional spasms of lower back and left thigh when in certain positions for long, left leg otherwise with tolerable pain level.     Objective:   BP (!) 119/54 (BP Location: Left Arm)   Pulse 80   Temp 98.3 F (36.8 C) (Oral)   Resp 20   Ht 5\' 4"  (1.626 m)   Wt 71.7 kg   SpO2 99%   BMI 27.13 kg/m   Physical Exam: JOA:[id number] resting in bed FUX:NATFT regular rhythm and normal rate, s1 and s2 normal Resp:clear to auscultation bilaterally, no rales/rhonchi DDU:KGUR, flat, non-tender Ext: trace left leg edema with clean/intact incisions. Right RCF with palpable thrill.   Labs: BMET Recent Labs  Lab 03/25/19 1000 03/26/19 2013 03/28/19 0329 03/29/19 0508 03/31/19 0715  NA 135 133* 137 134* 134*  K 5.6* 3.9 3.4* 4.9 4.0  CL 96* 95* 94* 97* 96*  CO2 21* 25 26 21* 23  GLUCOSE 172* 151* 180* 172* 104*  BUN 71* 48* 41* 51* 38*  CREATININE 4.76* 3.52* 3.56* 4.21* 4.37*  CALCIUM 8.2* 7.7* 8.0* 7.0* 7.3*  PHOS 5.3* 4.3  --   --  5.0*    CBC Recent Labs  Lab 03/26/19 2013 03/28/19 0329 03/29/19 0508 03/31/19 0715  WBC 10.8* 8.5 11.5* 10.9*  HGB 10.3* 12.7 8.0* 7.3*  HCT 31.0* 39.0 24.7* 22.4*  MCV 93.7 94.4 95.4 96.6  PLT 266 298 238 221      Medications:    . allopurinol  100 mg Oral BID  . aspirin EC  81 mg Oral Daily  . atorvastatin  10 mg Oral q1800  . bisacodyl  10 mg Rectal Once  . Chlorhexidine Gluconate Cloth  6 each Topical Q0600  . cinacalcet  30 mg Oral Q supper  . darbepoetin (ARANESP) injection - DIALYSIS  60 mcg Intravenous Q Tue-HD  . doxercalciferol  4 mcg Intravenous Q T,Th,Sa-HD  . feeding supplement (NEPRO CARB STEADY)  237 mL Oral BID BM  . hydrALAZINE  25 mg Oral Q8H  . insulin aspart  0-15 Units Subcutaneous TID WC  . insulin aspart  0-5 Units Subcutaneous QHS  . insulin detemir  10 Units Subcutaneous Daily  . isosorbide dinitrate  10 mg Oral TID  . multivitamin  1 tablet Oral QHS  . pantoprazole  40 mg Oral Daily  . polyethylene glycol  17 g Oral Daily  . sevelamer carbonate  1,600 mg Oral TID WC  . traZODone  25-100 mg Oral QHS   Elmarie Shiley, MD 04/01/2019, 9:17 AM

## 2019-04-01 NOTE — Progress Notes (Signed)
[employer] fired yellow due to having pt stand on scale to be weighed. Pt was anxious about standing because of recent surgery to L leg.

## 2019-04-01 NOTE — Progress Notes (Addendum)
Progress Note    04/01/2019 8:48 AM 4 Days Post-Op  Subjective:  Says her left leg is sore.    Afebrile HR 70's-80's 546'T-035'W systolic 65% RA  Vitals:   04/01/19 0310 04/01/19 0600  BP: (!) 117/47 (!) 128/57  Pulse: 82 83  Resp: (!) 37 (!) 22  Temp: 98.3 F (36.8 C)   SpO2: 96% 99%    Physical Exam: Cardiac:  regular Lungs:  Non labored Incisions:  All incisions look good. Extremities:  Brisk doppler signals left DP/PT/peroneal   CBC    Component Value Date/Time   WBC 10.9 (H) 03/31/2019 0715   RBC 2.32 (L) 03/31/2019 0715   HGB 7.3 (L) 03/31/2019 0715   HGB 11.7 05/18/2017 1218   HGB 12.3 02/02/2017 1136   HCT 22.4 (L) 03/31/2019 0715   HCT 34.7 05/18/2017 1218   HCT 37.4 02/02/2017 1136   PLT 221 03/31/2019 0715   PLT 227 05/18/2017 1218   MCV 96.6 03/31/2019 0715   MCV 87 05/18/2017 1218   MCV 90.6 02/02/2017 1136   MCH 31.5 03/31/2019 0715   MCHC 32.6 03/31/2019 0715   RDW 17.1 (H) 03/31/2019 0715   RDW 16.0 (H) 05/18/2017 1218   RDW 15.7 (H) 02/02/2017 1136   LYMPHSABS 1.5 03/23/2019 1355   LYMPHSABS 1.8 02/02/2017 1136   MONOABS 1.1 (H) 03/23/2019 1355   MONOABS 0.8 02/02/2017 1136   EOSABS 0.1 03/23/2019 1355   EOSABS 0.3 02/02/2017 1136   BASOSABS 0.0 03/23/2019 1355   BASOSABS 0.1 02/02/2017 1136    BMET    Component Value Date/Time   NA 134 (L) 03/31/2019 0715   NA 143 05/18/2017 1218   NA 143 02/02/2017 1137   K 4.0 03/31/2019 0715   K 3.8 02/02/2017 1137   CL 96 (L) 03/31/2019 0715   CO2 23 03/31/2019 0715   CO2 24 02/02/2017 1137   GLUCOSE 104 (H) 03/31/2019 0715   GLUCOSE 74 02/02/2017 1137   BUN 38 (H) 03/31/2019 0715   BUN 76 (HH) 05/18/2017 1218   BUN 63.4 (H) 02/02/2017 1137   CREATININE 4.37 (H) 03/31/2019 0715   CREATININE 3.17 (H) 01/10/2019 1525   CREATININE 3.0 (HH) 02/02/2017 1137   CALCIUM 7.3 (L) 03/31/2019 0715   CALCIUM 9.9 02/02/2017 1137   GFRNONAA 9 (L) 03/31/2019 0715   GFRNONAA 14 (L) 01/10/2019  1525   GFRAA 11 (L) 03/31/2019 0715   GFRAA 16 (L) 01/10/2019 1525    INR    Component Value Date/Time   INR 1.1 03/28/2019 0329     Intake/Output Summary (Last 24 hours) at 04/01/2019 0848 Last data filed at 03/31/2019 1038 Gross per 24 hour  Intake --  Output 1500 ml  Net -1500 ml     Assessment:  73 y.o. female is s/p:  L CFA endarterectomy and femoral to BK pop bypass with vein  4 Days Post-Op  Plan: -pt with patent bypass with brisk doppler signals left DP/PT/peroneal -ok for SNF from vascular standpoint when arranged.  PT now recommending CIR-order placed.  Her husband had hip surgery last Friday and unable to help care for her and her son is going back to [place] on Saturday. -discussed groin care with pt-keep groin dry to help prevent wound infection.  Will ask nursing to place dry gauze to left groin daily and replace as needed.  Please do not tape in place.    Leontine Locket, PA-C [facility] [phone] 04/01/2019 8:48 AM  I have seen  and evaluated the patient. I agree with the PA note as documented above.  Doing well status post left femoral endarterectomy and fem BK pop bypass.  Has very good Doppler signals in the left foot.  Incisions look clean and dry.  PT now recommending CIR.  Marty Heck, MD [facility] [place] Office: [phone] Pager: [phone]

## 2019-04-01 NOTE — Progress Notes (Signed)
Pharmacy Antibiotic Note  [name] is a 73 y.o. female admitted on 03/23/2019 with foot wound with imaging showing septic arthritis with osteomyelitis, surrounding inflammatory phlegmon and abscess, diffuse cellulitis and myofascitis. Pharmacy was consulted for vanc/piptazo, which was changed to cefepime 12/25.  12/21 S/p L common femoral endarterectomy/angioplasty + L femoral to popliteal bypass.  WBC hovering around 10-11. Afebrile. Last HD session 12/24, vancomycin given after. Next HD 12/27 per holiday schedule. No cultures available to guide therapy. Asked MD to consider switching to cefepime, which can be given with HD, MD agreed.   Plan: DC Vancomycin and pip/tazo  Start cefepime 1g daily, after HD on HD days  At discharge, change to cefepime 2g/2g/3g with HD sessions  Monitor HD plans and clinical status  Height: 5\' 4"  (162.6 cm) Weight: 158 lb 1.1 oz (71.7 kg) IBW/kg (Calculated) : 54.7  Temp (24hrs), Avg:98.3 F (36.8 C), Min:98 F (36.7 C), Max:98.6 F (37 C)  Recent Labs  Lab 03/26/19 1416 03/26/19 2013 03/28/19 0329 03/29/19 0508 03/29/19 1454 03/31/19 0715  WBC  --  10.8* 8.5 11.5*  --  10.9*  CREATININE  --  3.52* 3.56* 4.21*  --  4.37*  VANCORANDOM 26  --   --   --  38  --     Estimated Creatinine Clearance: 11.1 mL/min (A) (by C-G formula based on SCr of 4.37 mg/dL (H)).    Antibiotics this admission Cefe 12/25 >>  Vanc 12/10 (PTA) >> 12/25 Zosyn 12/16 >>12/25  Vancomycin dosing/levels and HD sessions: 12/17 pre HD VR = 33  12/18 HD 3.5hr, vancomycin 750mg  12/19 pre-HD VR 26 > HD 3 hr 12/20 vancomycin 500mg   12/22 HD 4hr; VR 38- drawn after vancomycin dose  12/22 HD 4hr; vancomycin 750mg  12/24 HD 3.5hr; vancomycin 750mg   Microbiology:  12/16 COVID neg 12/16 MRSA neg 12/16 Blood x 2: negative Outpt wound cx + serratia    Benetta Spar, PharmD, BCPS, BCCP Clinical Pharmacist  Please check AMION for all [place] phone numbers After  10:00 PM, call [place] [phone]

## 2019-04-01 NOTE — Plan of Care (Signed)
Problem: Elimination: Goal: Will not experience complications related to bowel motility Outcome: Progressing Note: Very large BM this evening following suppository given on day shift  Goal: Will not experience complications related to urinary retention Outcome: Progressing

## 2019-04-01 NOTE — Progress Notes (Signed)
PROGRESS NOTE    [name]   DGU:[id number]  DOB: 03-27-1946  DOA: 03/23/2019 PCP: Unk Pinto, MD   Brief Narrative:  [name] 73 year old married female, independent, PMH of CAD s/p CABG, chronic combined CHF/ICM, stage IV CKD (baseline creatinine 2.7-3.1), HTN, HLD, DM, lower extremity PAD (bilateral SFA occlusion 07/2018), chronic bilateral feet ulcerations, osteomyelitis of left toe, LBBB who has had a chronic wound on her left foot that is followed at the wound care center closely, recently noted worsening infection and started on IV antibiotics including vancomycin at dialysis (reportedly completed 3 doses) sent to [facility] from the wound care center due to low-grade fevers and increased drainage from the ulcer.  Admitted for left little toe osteomyelitis, abscess and sepsis complicating PAD.  Nephrology consulted for HD needs.  S/p aortogram with bilateral lower extremity arteriogram with runoff 12/18 followed by left CFA endarterectomy and Dacron patch angioplasty, left femoral to below-knee popliteal bypass graft on 12/21.  Orthopedics/Dr. Sharol Given plans surgical intervention after vascular procedure.    Subjective: No complaints.     Assessment & Plan:   Principal Problem:   Osteomyelitis-septic arthritis left fifth metatarsal joint, abscess and ulcer in setting of Peripheral artery disease -Patient has failed outpatient antibiotic treatment and has been started on vancomycin and Zosyn in the hospital -Outpatient wound culture grew out Serratia-blood cultures have been negative -5/15-MRI left foot: 1. Septic arthritis at the fifth MTP joint with osteomyelitis involving the distal half of the fifth metatarsal and also the proximal phalanx. 2. Surrounding inflammatory phlegmon and abscess. 3. Diffuse cellulitis and myofasciitis.-12/18-lower extremity arteriogram revealed 70% stenosis of the left common femoral artery with heavily calcified plaque, SFA  occluded throughout proximal mid and distal segment-50% right common femoral artery stenosis and right SFA occlusion throughout the entire segment- - 12/21-patient underwent left CFA endarterectomy and Dacron patch angioplasty and left below the knee popliteal bypass graft -Orthopedic surgery team does not plan on doing any further procedures in the hospital and will follow up on the patient in about a week in the office - plan to d/c to SNF with antibiotics with dialysis  Active Problems: Ulcers on right leg and foot -These are suspected to be ischemic ulcers -The patient states that these have been present for many months and apparently are healing slowly -She follows as outpatient with the wound care center    Essential hypertension -Continue current medications which include hydralazine and isosorbide dinitrate    Diabetes mellitus type 2, insulin dependent  -Last A1c was 6.3-at home she takes 70/30 insulin Continue Levemir and sliding scale insulin in the hospital    ESRD on hemodialysis  -Receives dialysis on Tuesday Thursday Saturday and received at this morning  Chronic systolic heart failure -Fluid management is with dialysis  Gout -Continue allopurinol  Anemia of chronic disease -Stable   Time spent in minutes: 35 DVT prophylaxis: SCDs Code Status: Full code Family Communication: None at bedside Disposition Plan: -continue to treat infection- awaiting SNF Consultants:   Vascular surgery  Orthopedic surgery  Nephrology Procedures:   Dr. Carlis Abbott, VVS S/p aortogram with bilateral lower extremity arteriogram with runoff 12/18.   Dr. Donnetta Hutching, VVS S/P left CFA endarterectomy and Dacron patch angioplasty, left femoral to below-knee popliteal bypass graft on 12/21.  HD Antimicrobials:  Anti-infectives (From admission, onward)   Start     Dose/Rate Route Frequency Ordered Stop   03/31/19 0000  Vancomycin (VANCOCIN) 750-5 MG/150ML-% SOLN     750  mg Intravenous Every  T-Th-Sa (Hemodialysis) 03/30/19 1211     03/30/19 0000  piperacillin-tazobactam (ZOSYN) 2-0.25 GM/50ML IVPB     2.25 g Intravenous Every 8 hours 03/30/19 1211     03/29/19 1200  vancomycin (VANCOCIN) IVPB 750 mg/150 ml premix     750 mg 150 mL/hr over 60 Minutes Intravenous Every T-Th-Sa (Hemodialysis) 03/26/19 2303     03/28/19 0600  cefUROXime (ZINACEF) 1.5 g in sodium chloride 0.9 % 100 mL IVPB     1.5 g 200 mL/hr over 30 Minutes Intravenous On call to O.R. 03/27/19 1132 03/28/19 1453   03/26/19 2359  vancomycin (VANCOREADY) IVPB 500 mg/100 mL     500 mg 100 mL/hr over 60 Minutes Intravenous  Once 03/26/19 2303 03/27/19 0716   03/25/19 1730  vancomycin (VANCOCIN) IVPB 750 mg/150 ml premix     750 mg 150 mL/hr over 60 Minutes Intravenous  Once 03/25/19 1513 03/25/19 1627   03/24/19 1800  piperacillin-tazobactam (ZOSYN) IVPB 2.25 g     2.25 g 100 mL/hr over 30 Minutes Intravenous Every 8 hours 03/24/19 1631     03/24/19 0000  vancomycin variable dose per unstable renal function (pharmacist dosing)  Status:  Discontinued      Does not apply See admin instructions 03/23/19 1528 03/29/19 0720   03/23/19 1530  vancomycin (VANCOREADY) IVPB 1500 mg/300 mL     1,500 mg 150 mL/hr over 120 Minutes Intravenous  Once 03/23/19 1524 03/23/19 1949   03/23/19 1515  piperacillin-tazobactam (ZOSYN) IVPB 3.375 g     3.375 g 100 mL/hr over 30 Minutes Intravenous  Once 03/23/19 1504 03/23/19 1556       Objective: Vitals:   03/31/19 2010 04/01/19 0310 04/01/19 0600 04/01/19 0907  BP: (!) 103/42 (!) 117/47 (!) 128/57 (!) 119/54  Pulse: 81 82 83 80  Resp: 16 (!) 37 (!) 22 20  Temp: 98.6 F (37 C) 98.3 F (36.8 C)  98 F (36.7 C)  TempSrc: Oral Oral  Oral  SpO2: 95% 96% 99% 99%  Weight:  71.7 kg    Height:       No intake or output data in the 24 hours ending 04/01/19 1226 Filed Weights   03/31/19 0649 03/31/19 1038 04/01/19 0310  Weight: 71.9 kg 70.2 kg 71.7 kg    Examination: General  exam: Appears comfortable  HEENT: PERRLA, oral mucosa moist, no sclera icterus or thrush Respiratory system: Clear to auscultation. Respiratory effort normal. Cardiovascular system: S1 & S2 heard,  No murmurs  Gastrointestinal system: Abdomen soft, non-tender, nondistended. Normal bowel sounds   Central nervous system: Alert and oriented. No focal neurological deficits. Extremities: No cyanosis, clubbing or edema- ulcers noted on b/l lower extremities Skin: No rashes or ulcers Psychiatry:  Mood & affect appropriate.  Data Reviewed: I have personally reviewed following labs and imaging studies  CBC: Recent Labs  Lab 03/26/19 2013 03/28/19 0329 03/29/19 0508 03/31/19 0715  WBC 10.8* 8.5 11.5* 10.9*  HGB 10.3* 12.7 8.0* 7.3*  HCT 31.0* 39.0 24.7* 22.4*  MCV 93.7 94.4 95.4 96.6  PLT 266 298 238 564   Basic Metabolic Panel: Recent Labs  Lab 03/26/19 2013 03/28/19 0329 03/29/19 0508 03/31/19 0715  NA 133* 137 134* 134*  K 3.9 3.4* 4.9 4.0  CL 95* 94* 97* 96*  CO2 25 26 21* 23  GLUCOSE 151* 180* 172* 104*  BUN 48* 41* 51* 38*  CREATININE 3.52* 3.56* 4.21* 4.37*  CALCIUM 7.7* 8.0* 7.0* 7.3*  PHOS 4.3  --   --  5.0*   GFR: Estimated Creatinine Clearance: 11.1 mL/min (A) (by C-G formula based on SCr of 4.37 mg/dL (H)). Liver Function Tests: Recent Labs  Lab 03/26/19 2013 03/31/19 0715  ALBUMIN 2.5* 2.3*   No results for input(s): LIPASE, AMYLASE in the last 168 hours. No results for input(s): AMMONIA in the last 168 hours. Coagulation Profile: Recent Labs  Lab 03/28/19 0329  INR 1.1   Cardiac Enzymes: No results for input(s): CKTOTAL, CKMB, CKMBINDEX, TROPONINI in the last 168 hours. BNP (last 3 results) No results for input(s): PROBNP in the last 8760 hours. HbA1C: No results for input(s): HGBA1C in the last 72 hours. CBG: Recent Labs  Lab 03/31/19 1542 03/31/19 2104 04/01/19 0637 04/01/19 0917 04/01/19 1112  GLUCAP 201* 135* 133* 107* 139*   Lipid  Profile: No results for input(s): CHOL, HDL, LDLCALC, TRIG, CHOLHDL, LDLDIRECT in the last 72 hours. Thyroid Function Tests: No results for input(s): TSH, T4TOTAL, FREET4, T3FREE, THYROIDAB in the last 72 hours. Anemia Panel: No results for input(s): VITAMINB12, FOLATE, FERRITIN, TIBC, IRON, RETICCTPCT in the last 72 hours. Urine analysis:    Component Value Date/Time   COLORURINE YELLOW 01/10/2019 [place] 01/10/2019 1525   LABSPEC 1.019 01/10/2019 1525   PHURINE 7.5 01/10/2019 1525   GLUCOSEU TRACE (A) 01/10/2019 1525   HGBUR NEGATIVE 01/10/2019 1525   BILIRUBINUR NEGATIVE 08/25/2018 0045   KETONESUR NEGATIVE 01/10/2019 1525   PROTEINUR 3+ (A) 01/10/2019 1525   UROBILINOGEN 0.2 05/30/2009 1500   NITRITE NEGATIVE 01/10/2019 1525   LEUKOCYTESUR NEGATIVE 01/10/2019 1525   Sepsis Labs: @LABRCNTIP (procalcitonin:4,lacticidven:4) ) Recent Results (from the past 240 hour(s))  Culture, blood (Routine x 2)     Status: None   Collection Time: 03/23/19  1:38 PM   Specimen: BLOOD  Result Value Ref Range Status   Specimen Description BLOOD LEFT ANTECUBITAL  Final   Special Requests   Final    BOTTLES DRAWN AEROBIC AND ANAEROBIC Blood Culture results may not be optimal due to an inadequate volume of blood received in culture bottles   Culture   Final    NO GROWTH 5 DAYS Performed at [facility], [place] [address] [place],  [address]    Report Status 03/28/2019 FINAL  Final  Culture, blood (Routine x 2)     Status: None   Collection Time: 03/23/19  3:38 PM   Specimen: BLOOD  Result Value Ref Range Status   Specimen Description BLOOD BLOOD LEFT FOREARM  Final   Special Requests   Final    BOTTLES DRAWN AEROBIC AND ANAEROBIC Blood Culture results may not be optimal due to an excessive volume of blood received in culture bottles   Culture   Final    NO GROWTH 5 DAYS Performed at [facility], [place] [address], [place],  [address]    Report  Status 03/28/2019 FINAL  Final  SARS CORONAVIRUS 2 (TAT 6-24 HRS) Nasopharyngeal Nasopharyngeal Swab     Status: None   Collection Time: 03/23/19  3:57 PM   Specimen: Nasopharyngeal Swab  Result Value Ref Range Status   SARS Coronavirus 2 NEGATIVE NEGATIVE Final    Comment: (NOTE) SARS-CoV-2 target nucleic acids are NOT DETECTED. The SARS-CoV-2 RNA is generally detectable in upper and lower respiratory specimens during the acute phase of infection. Negative results do not preclude SARS-CoV-2 infection, do not rule out co-infections with other pathogens, and should not be used as the sole basis for treatment or other patient  management decisions. Negative results must be combined with clinical observations, patient history, and epidemiological information. The expected result is Negative. Fact Sheet for Patients: SugarRoll.be Fact Sheet for Healthcare Providers: https://www.woods-mathews.com/ This test is not yet approved or cleared by the [place] FDA and  has been authorized for detection and/or diagnosis of SARS-CoV-2 by FDA under an Emergency Use Authorization (EUA). This EUA will remain  in effect (meaning this test can be used) for the duration of the COVID-19 declaration under Section 56 4(b)(1) of the Act, 21 U.S.C. section 360bbb-3(b)(1), unless the authorization is terminated or revoked sooner. Performed at [facility], [place] [address], [place], [place] [address]   MRSA PCR Screening     Status: None   Collection Time: 03/24/19  6:10 AM   Specimen: Nasal Mucosa; Nasopharyngeal  Result Value Ref Range Status   MRSA by PCR NEGATIVE NEGATIVE Final    Comment:        The GeneXpert MRSA Assay (FDA approved for NASAL specimens only), is one component of a comprehensive MRSA colonization surveillance program. It is not intended to diagnose MRSA infection nor to guide or monitor treatment for MRSA infections. Performed  at [facility], [place] [address], [place], [place] [address]          Radiology Studies: No results found.    Scheduled Meds: . allopurinol  100 mg Oral BID  . aspirin EC  81 mg Oral Daily  . atorvastatin  10 mg Oral q1800  . bisacodyl  10 mg Rectal Once  . Chlorhexidine Gluconate Cloth  6 each Topical Q0600  . cinacalcet  30 mg Oral Q supper  . darbepoetin (ARANESP) injection - DIALYSIS  60 mcg Intravenous Q Tue-HD  . doxercalciferol  4 mcg Intravenous Q T,Th,Sa-HD  . feeding supplement (NEPRO CARB STEADY)  237 mL Oral BID BM  . hydrALAZINE  25 mg Oral Q8H  . insulin aspart  0-15 Units Subcutaneous TID WC  . insulin aspart  0-5 Units Subcutaneous QHS  . insulin detemir  10 Units Subcutaneous Daily  . isosorbide dinitrate  10 mg Oral TID  . multivitamin  1 tablet Oral QHS  . pantoprazole  40 mg Oral Daily  . polyethylene glycol  17 g Oral Daily  . sevelamer carbonate  1,600 mg Oral TID WC  . traZODone  25-100 mg Oral QHS   Continuous Infusions: . sodium chloride    . magnesium sulfate bolus IVPB    . piperacillin-tazobactam (ZOSYN)  IV 2.25 g (04/01/19 0550)  . vancomycin Stopped (03/31/19 1115)     LOS: 9 days      Debbe Odea, MD [facility] Pager: [email or web address] Password [facility] 04/01/2019, 12:26 PM

## 2019-04-02 LAB — GLUCOSE, CAPILLARY
Glucose-Capillary: 101 mg/dL — ABNORMAL HIGH (ref 70–99)
Glucose-Capillary: 143 mg/dL — ABNORMAL HIGH (ref 70–99)
Glucose-Capillary: 122 mg/dL — ABNORMAL HIGH (ref 70–99)
Glucose-Capillary: 159 mg/dL — ABNORMAL HIGH (ref 70–99)

## 2019-04-02 NOTE — Progress Notes (Addendum)
  Progress Note    04/02/2019 9:14 AM 5 Days Post-Op  Subjective:  No complaints.  afebrile  Vitals:   04/01/19 1959 04/02/19 0355  BP: 139/63 (!) 130/59  Pulse: 85 65  Resp: 20 14  Temp: 98.6 F (37 C) 97.8 F (36.6 C)  SpO2: 99% 99%    Physical Exam: Cardiac:  regular Lungs:  Non labored Incisions:  All incisions are clean and dry Extremities:  Brisk doppler signals left DP/PT   CBC    Component Value Date/Time   WBC 10.9 (H) 03/31/2019 0715   RBC 2.32 (L) 03/31/2019 0715   HGB 7.3 (L) 03/31/2019 0715   HGB 11.7 05/18/2017 1218   HGB 12.3 02/02/2017 1136   HCT 22.4 (L) 03/31/2019 0715   HCT 34.7 05/18/2017 1218   HCT 37.4 02/02/2017 1136   PLT 221 03/31/2019 0715   PLT 227 05/18/2017 1218   MCV 96.6 03/31/2019 0715   MCV 87 05/18/2017 1218   MCV 90.6 02/02/2017 1136   MCH 31.5 03/31/2019 0715   MCHC 32.6 03/31/2019 0715   RDW 17.1 (H) 03/31/2019 0715   RDW 16.0 (H) 05/18/2017 1218   RDW 15.7 (H) 02/02/2017 1136   LYMPHSABS 1.5 03/23/2019 1355   LYMPHSABS 1.8 02/02/2017 1136   MONOABS 1.1 (H) 03/23/2019 1355   MONOABS 0.8 02/02/2017 1136   EOSABS 0.1 03/23/2019 1355   EOSABS 0.3 02/02/2017 1136   BASOSABS 0.0 03/23/2019 1355   BASOSABS 0.1 02/02/2017 1136    BMET    Component Value Date/Time   NA 134 (L) 03/31/2019 0715   NA 143 05/18/2017 1218   NA 143 02/02/2017 1137   K 4.0 03/31/2019 0715   K 3.8 02/02/2017 1137   CL 96 (L) 03/31/2019 0715   CO2 23 03/31/2019 0715   CO2 24 02/02/2017 1137   GLUCOSE 104 (H) 03/31/2019 0715   GLUCOSE 74 02/02/2017 1137   BUN 38 (H) 03/31/2019 0715   BUN 76 (HH) 05/18/2017 1218   BUN 63.4 (H) 02/02/2017 1137   CREATININE 4.37 (H) 03/31/2019 0715   CREATININE 3.17 (H) 01/10/2019 1525   CREATININE 3.0 (HH) 02/02/2017 1137   CALCIUM 7.3 (L) 03/31/2019 0715   CALCIUM 9.9 02/02/2017 1137   GFRNONAA 9 (L) 03/31/2019 0715   GFRNONAA 14 (L) 01/10/2019 1525   GFRAA 11 (L) 03/31/2019 0715   GFRAA 16 (L)  01/10/2019 1525    INR    Component Value Date/Time   INR 1.1 03/28/2019 0329     Intake/Output Summary (Last 24 hours) at 04/02/2019 0914 Last data filed at 04/02/2019 0600 Gross per 24 hour  Intake 587 ml  Output --  Net 587 ml     Assessment:  73 y.o. female is s/p:  L CFA endarterectomy and femoral to BK pop bypass with vein  5 Days Post-Op  Plan: -pt with brisk doppler signals left DP/PT -CIR consult placed yesterday-hopefully she is a candidate for inpatient rehab but will defer to primary service    Leontine Locket, PA-C [facility] [phone] 04/02/2019 9:14 AM  I have seen and evaluated the patient. I agree with the PA note as documented above.  Postop day 5 status post left femoral endarterectomy with femoral to BK pop bypass with vein.  Continues to have good Doppler signals.  CIR consult placed.  Marty Heck, MD [facility] [place] Office: [phone] Pager: [phone]

## 2019-04-02 NOTE — Progress Notes (Signed)
PROGRESS NOTE    [name]   IRJ:[id number]  DOB: 1945/04/25  DOA: 03/23/2019 PCP: Unk Pinto, MD   Brief Narrative:  [name] 73 year old married female, independent, PMH of CAD s/p CABG, chronic combined CHF/ICM, stage IV CKD (baseline creatinine 2.7-3.1), HTN, HLD, DM, lower extremity PAD (bilateral SFA occlusion 07/2018), chronic bilateral feet ulcerations, osteomyelitis of left toe, LBBB who has had a chronic wound on her left foot that is followed at the wound care center closely, recently noted worsening infection and started on IV antibiotics including vancomycin at dialysis (reportedly completed 3 doses) sent to [facility] from the wound care center due to low-grade fevers and increased drainage from the ulcer.  Admitted for left little toe osteomyelitis, abscess and sepsis complicating PAD.  Nephrology consulted for HD needs.  S/p aortogram with bilateral lower extremity arteriogram with runoff 12/18 followed by left CFA endarterectomy and Dacron patch angioplasty, left femoral to below-knee popliteal bypass graft on 12/21.  Orthopedics/Dr. Sharol Given plans surgical intervention after vascular procedure.    Subjective: She has some pain in her lower back and discomfort related to her sciatica.  Assessment & Plan:   Principal Problem:   Osteomyelitis-septic arthritis left fifth metatarsal joint, abscess and ulcer in setting of Peripheral artery disease -Patient has failed outpatient antibiotic treatment and has been started on vancomycin and Zosyn in the hospital -Outpatient wound culture grew out Serratia-blood cultures have been negative -5/15-MRI left foot: 1. Septic arthritis at the fifth MTP joint with osteomyelitis involving the distal half of the fifth metatarsal and also the proximal phalanx. 2. Surrounding inflammatory phlegmon and abscess. 3. Diffuse cellulitis and myofasciitis. -12/18-lower extremity arteriogram revealed 70% stenosis of the left  common femoral artery with heavily calcified plaque, SFA occluded throughout proximal mid and distal segment-50% right common femoral artery stenosis and right SFA occlusion throughout the entire segment- - 12/21-patient underwent left CFA endarterectomy and Dacron patch angioplasty and left below the knee popliteal bypass graft -Orthopedic surgery team does not plan on doing any further procedures in the hospital and will follow up on the patient in about a week in the office - plan to d/c to SNF with antibiotics with dialysis- awaiting SNF bed  Active Problems: Ulcers on right leg and foot -These are suspected to be ischemic ulcers -The patient states that these have been present for many months and apparently are healing slowly -She follows as outpatient with the wound care center    Essential hypertension -Continue current medications which include hydralazine and isosorbide dinitrate    Diabetes mellitus type 2, insulin dependent  -Last A1c was 6.3-at home she takes 70/30 insulin Continue Levemir and sliding scale insulin in the hospital    ESRD on hemodialysis  -Receives dialysis on Tuesday Thursday Saturday and received at this morning  Chronic systolic heart failure -Fluid management is with dialysis  Gout -Continue allopurinol  Anemia of chronic disease -Stable   Time spent in minutes: 35 DVT prophylaxis: SCDs Code Status: Full code Family Communication: None at bedside Disposition Plan: -continue to treat infection- awaiting SNF Consultants:   Vascular surgery  Orthopedic surgery  Nephrology Procedures:   Dr. Carlis Abbott, [facility] S/p aortogram with bilateral lower extremity arteriogram with runoff 12/18.   Dr. Donnetta Hutching, [facility] S/P left CFA endarterectomy and Dacron patch angioplasty, left femoral to below-knee popliteal bypass graft on 12/21.  HD Antimicrobials:  Anti-infectives (From admission, onward)   Start     Dose/Rate Route Frequency Ordered Stop  04/01/19 2200   ceFEPIme (MAXIPIME) 1 g in sodium chloride 0.9 % 100 mL IVPB     1 g 200 mL/hr over 30 Minutes Intravenous Every 24 hours 04/01/19 1516     03/31/19 0000  Vancomycin (VANCOCIN) 750-5 MG/150ML-% SOLN     750 mg Intravenous Every T-Th-Sa (Hemodialysis) 03/30/19 1211     03/30/19 0000  piperacillin-tazobactam (ZOSYN) 2-0.25 GM/50ML IVPB     2.25 g Intravenous Every 8 hours 03/30/19 1211     03/29/19 1200  vancomycin (VANCOCIN) IVPB 750 mg/150 ml premix  Status:  Discontinued     750 mg 150 mL/hr over 60 Minutes Intravenous Every T-Th-Sa (Hemodialysis) 03/26/19 2303 04/01/19 1507   03/28/19 0600  cefUROXime (ZINACEF) 1.5 g in sodium chloride 0.9 % 100 mL IVPB     1.5 g 200 mL/hr over 30 Minutes Intravenous On call to O.R. 03/27/19 1132 03/28/19 1453   03/26/19 2359  vancomycin (VANCOREADY) IVPB 500 mg/100 mL     500 mg 100 mL/hr over 60 Minutes Intravenous  Once 03/26/19 2303 03/27/19 0716   03/25/19 1730  vancomycin (VANCOCIN) IVPB 750 mg/150 ml premix     750 mg 150 mL/hr over 60 Minutes Intravenous  Once 03/25/19 1513 03/25/19 1627   03/24/19 1800  piperacillin-tazobactam (ZOSYN) IVPB 2.25 g     2.25 g 100 mL/hr over 30 Minutes Intravenous Every 8 hours 03/24/19 1631 04/01/19 1700   03/24/19 0000  vancomycin variable dose per unstable renal function (pharmacist dosing)  Status:  Discontinued      Does not apply See admin instructions 03/23/19 1528 03/29/19 0720   03/23/19 1530  vancomycin (VANCOREADY) IVPB 1500 mg/300 mL     1,500 mg 150 mL/hr over 120 Minutes Intravenous  Once 03/23/19 1524 03/23/19 1949   03/23/19 1515  piperacillin-tazobactam (ZOSYN) IVPB 3.375 g     3.375 g 100 mL/hr over 30 Minutes Intravenous  Once 03/23/19 1504 03/23/19 1556       Objective: Vitals:   04/01/19 1457 04/01/19 1646 04/01/19 1959 04/02/19 0355  BP: (!) 125/57 (!) 134/56 139/63 (!) 130/59  Pulse: 82 87 85 65  Resp: 20 17 20 14   Temp:   98.6 F (37 C) 97.8 F (36.6 C)  TempSrc:   Oral Oral    SpO2:   99% 99%  Weight:    72.9 kg  Height:        Intake/Output Summary (Last 24 hours) at 04/02/2019 1229 Last data filed at 04/02/2019 0900 Gross per 24 hour  Intake 527 ml  Output --  Net 527 ml   Filed Weights   03/31/19 1038 04/01/19 0310 04/02/19 0355  Weight: 70.2 kg 71.7 kg 72.9 kg    Examination: General exam: Appears comfortable  HEENT: PERRLA, oral mucosa moist, no sclera icterus or thrush Respiratory system: Clear to auscultation. Respiratory effort normal. Cardiovascular system: S1 & S2 heard,  No murmurs  Gastrointestinal system: Abdomen soft, non-tender, nondistended. Normal bowel sounds   Central nervous system: Alert and oriented. No focal neurological deficits. Extremities: No cyanosis, clubbing or edema Skin: No rashes or ulcers Psychiatry:  Mood & affect appropriate.      Data Reviewed: I have personally reviewed following labs and imaging studies  CBC: Recent Labs  Lab 03/26/19 2013 03/28/19 0329 03/29/19 0508 03/31/19 0715  WBC 10.8* 8.5 11.5* 10.9*  HGB 10.3* 12.7 8.0* 7.3*  HCT 31.0* 39.0 24.7* 22.4*  MCV 93.7 94.4 95.4 96.6  PLT 266 298 238 221  Basic Metabolic Panel: Recent Labs  Lab 03/26/19 2013 03/28/19 0329 03/29/19 0508 03/31/19 0715  NA 133* 137 134* 134*  K 3.9 3.4* 4.9 4.0  CL 95* 94* 97* 96*  CO2 25 26 21* 23  GLUCOSE 151* 180* 172* 104*  BUN 48* 41* 51* 38*  CREATININE 3.52* 3.56* 4.21* 4.37*  CALCIUM 7.7* 8.0* 7.0* 7.3*  PHOS 4.3  --   --  5.0*   GFR: Estimated Creatinine Clearance: 11.2 mL/min (A) (by C-G formula based on SCr of 4.37 mg/dL (H)). Liver Function Tests: Recent Labs  Lab 03/26/19 2013 03/31/19 0715  ALBUMIN 2.5* 2.3*   No results for input(s): LIPASE, AMYLASE in the last 168 hours. No results for input(s): AMMONIA in the last 168 hours. Coagulation Profile: Recent Labs  Lab 03/28/19 0329  INR 1.1   Cardiac Enzymes: No results for input(s): CKTOTAL, CKMB, CKMBINDEX, TROPONINI in the  last 168 hours. BNP (last 3 results) No results for input(s): PROBNP in the last 8760 hours. HbA1C: No results for input(s): HGBA1C in the last 72 hours. CBG: Recent Labs  Lab 04/01/19 1112 04/01/19 1646 04/01/19 2126 04/02/19 0614 04/02/19 1106  GLUCAP 139* 118* 163* 101* 143*   Lipid Profile: No results for input(s): CHOL, HDL, LDLCALC, TRIG, CHOLHDL, LDLDIRECT in the last 72 hours. Thyroid Function Tests: No results for input(s): TSH, T4TOTAL, FREET4, T3FREE, THYROIDAB in the last 72 hours. Anemia Panel: No results for input(s): VITAMINB12, FOLATE, FERRITIN, TIBC, IRON, RETICCTPCT in the last 72 hours. Urine analysis:    Component Value Date/Time   COLORURINE YELLOW 01/10/2019 [place] 01/10/2019 1525   LABSPEC 1.019 01/10/2019 1525   PHURINE 7.5 01/10/2019 1525   GLUCOSEU TRACE (A) 01/10/2019 1525   HGBUR NEGATIVE 01/10/2019 1525   BILIRUBINUR NEGATIVE 08/25/2018 0045   KETONESUR NEGATIVE 01/10/2019 1525   PROTEINUR 3+ (A) 01/10/2019 1525   UROBILINOGEN 0.2 05/30/2009 1500   NITRITE NEGATIVE 01/10/2019 1525   LEUKOCYTESUR NEGATIVE 01/10/2019 1525   Sepsis Labs: @LABRCNTIP (procalcitonin:4,lacticidven:4) ) Recent Results (from the past 240 hour(s))  Culture, blood (Routine x 2)     Status: None   Collection Time: 03/23/19  1:38 PM   Specimen: BLOOD  Result Value Ref Range Status   Specimen Description BLOOD LEFT ANTECUBITAL  Final   Special Requests   Final    BOTTLES DRAWN AEROBIC AND ANAEROBIC Blood Culture results may not be optimal due to an inadequate volume of blood received in culture bottles   Culture   Final    NO GROWTH 5 DAYS Performed at [facility], [place] [address], [place], [place] [address]    Report Status 03/28/2019 FINAL  Final  Culture, blood (Routine x 2)     Status: None   Collection Time: 03/23/19  3:38 PM   Specimen: BLOOD  Result Value Ref Range Status   Specimen Description BLOOD BLOOD LEFT FOREARM  Final    Special Requests   Final    BOTTLES DRAWN AEROBIC AND ANAEROBIC Blood Culture results may not be optimal due to an excessive volume of blood received in culture bottles   Culture   Final    NO GROWTH 5 DAYS Performed at [facility], [place] [address] [place], [place] [address]    Report Status 03/28/2019 FINAL  Final  SARS CORONAVIRUS 2 (TAT 6-24 HRS) Nasopharyngeal Nasopharyngeal Swab     Status: None   Collection Time: 03/23/19  3:57 PM   Specimen: Nasopharyngeal Swab  Result Value Ref Range Status   SARS Coronavirus 2 NEGATIVE NEGATIVE Final    Comment: (NOTE) SARS-CoV-2 target nucleic acids are NOT DETECTED. The SARS-CoV-2 RNA is generally detectable in upper and lower respiratory specimens during the acute phase of infection. Negative results do not preclude SARS-CoV-2 infection, do not rule out co-infections with other pathogens, and should not be used as the sole basis for treatment or other patient management decisions. Negative results must be combined with clinical observations, patient history, and epidemiological information. The expected result is Negative. Fact Sheet for Patients: SugarRoll.be Fact Sheet for Healthcare Providers: https://www.woods-mathews.com/ This test is not yet approved or cleared by the [place] FDA and  has been authorized for detection and/or diagnosis of SARS-CoV-2 by FDA under an Emergency Use Authorization (EUA). This EUA will remain  in effect (meaning this test can be used) for the duration of the COVID-19 declaration under Section 56 4(b)(1) of the Act, 21 U.S.C. section 360bbb-3(b)(1), unless the authorization is terminated or revoked sooner. Performed at [facility], [place] [address], [place], [place] [address]   MRSA PCR Screening     Status: None   Collection Time: 03/24/19  6:10 AM   Specimen: Nasal Mucosa; Nasopharyngeal  Result Value Ref Range Status   MRSA by PCR NEGATIVE  NEGATIVE Final    Comment:        The GeneXpert MRSA Assay (FDA approved for NASAL specimens only), is one component of a comprehensive MRSA colonization surveillance program. It is not intended to diagnose MRSA infection nor to guide or monitor treatment for MRSA infections. Performed at [facility], [place] [address] [place], [place] [address]          Radiology Studies: No results found.    Scheduled Meds: . allopurinol  100 mg Oral BID  . aspirin EC  81 mg Oral Daily  . atorvastatin  10 mg Oral q1800  . Chlorhexidine Gluconate Cloth  6 each Topical Q0600  . cinacalcet  30 mg Oral Q supper  . darbepoetin (ARANESP) injection - DIALYSIS  60 mcg Intravenous Q Tue-HD  . doxercalciferol  4 mcg Intravenous Q T,Th,Sa-HD  . feeding supplement (NEPRO CARB STEADY)  237 mL Oral BID BM  . hydrALAZINE  25 mg Oral Q8H  . insulin aspart  0-15 Units Subcutaneous TID WC  . insulin aspart  0-5 Units Subcutaneous QHS  . insulin detemir  10 Units Subcutaneous Daily  . isosorbide dinitrate  10 mg Oral TID  . multivitamin  1 tablet Oral QHS  . pantoprazole  40 mg Oral Daily  . polyethylene glycol  17 g Oral Daily  . sevelamer carbonate  1,600 mg Oral TID WC  . traZODone  25-100 mg Oral QHS   Continuous Infusions: . sodium chloride    . ceFEPime (MAXIPIME) IV Stopped (04/01/19 2214)  . magnesium sulfate bolus IVPB       LOS: 10 days      Pamela Odea, MD [facility] Pager: [email or web address] Password [facility] 04/02/2019, 12:29 PM

## 2019-04-02 NOTE — Progress Notes (Signed)
Patient ID: CHARNE MCBRIEN, female   DOB: 02/12/1946, 73 y.o.   MRN: [id number] [facility] Progress Note   Assessment/ Plan:   1. 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  To continue ongoing PT/OT either following admission to CIR or SNF.  Antibiotic now narrowed down to intravenous cefepime.  2. ESRD: Usually undergoes hemodialysis on a TTS schedule and will order for next hemodialysis tomorrow per holiday schedule.  She is close to euvolemic and does not have any acute dialysis needs at this time.  3. Anemia:With post-op hemoglobin drop noted, continue ESA and check iron studies today.  4. CKD-MBD:Continue renal diet with sevelamer for phosphorus binding- phosphorus at goal. On hectorol/cinacalcet for PTH suppression.  5. Nutrition:Continue renal diet/MVI/ONS.  6. Hypertension:Blood pressures under acceptable control on HD/UF and current medications.  Subjective:   Uneventful night with occasional discomfort of back/thigh from positioning.     Objective:   BP (!) 130/59 (BP Location: Left Arm)   Pulse 65   Temp 97.8 F (36.6 C) (Oral)   Resp 14   Ht 5\' 4"  (1.626 m)   Wt 72.9 kg   SpO2 99%   BMI 27.59 kg/m   Physical Exam: OAC:[id number] resting in bed UXN:ATFTD regular rhythm and normal rate, s1 and s2 normal Resp:clear to auscultation bilaterally, no rales/rhonchi DUK:GURK, flat, non-tender Ext: trace left leg edema with clean/intact incisions. Right RCF with palpable thrill.   Labs: BMET Recent Labs  Lab 03/26/19 2013 03/28/19 0329 03/29/19 0508 03/31/19 0715  NA 133* 137 134* 134*  K 3.9 3.4* 4.9 4.0  CL 95* 94* 97* 96*  CO2 25 26 21* 23  GLUCOSE 151* 180* 172* 104*  BUN 48* 41* 51* 38*  CREATININE 3.52* 3.56* 4.21* 4.37*  CALCIUM 7.7* 8.0* 7.0* 7.3*  PHOS 4.3  --   --  5.0*   CBC Recent Labs  Lab 03/26/19 2013 03/28/19 0329 03/29/19 0508 03/31/19 0715  WBC  10.8* 8.5 11.5* 10.9*  HGB 10.3* 12.7 8.0* 7.3*  HCT 31.0* 39.0 24.7* 22.4*  MCV 93.7 94.4 95.4 96.6  PLT 266 298 238 221      Medications:    . allopurinol  100 mg Oral BID  . aspirin EC  81 mg Oral Daily  . atorvastatin  10 mg Oral q1800  . Chlorhexidine Gluconate Cloth  6 each Topical Q0600  . cinacalcet  30 mg Oral Q supper  . darbepoetin (ARANESP) injection - DIALYSIS  60 mcg Intravenous Q Tue-HD  . doxercalciferol  4 mcg Intravenous Q T,Th,Sa-HD  . feeding supplement (NEPRO CARB STEADY)  237 mL Oral BID BM  . hydrALAZINE  25 mg Oral Q8H  . insulin aspart  0-15 Units Subcutaneous TID WC  . insulin aspart  0-5 Units Subcutaneous QHS  . insulin detemir  10 Units Subcutaneous Daily  . isosorbide dinitrate  10 mg Oral TID  . multivitamin  1 tablet Oral QHS  . pantoprazole  40 mg Oral Daily  . polyethylene glycol  17 g Oral Daily  . sevelamer carbonate  1,600 mg Oral TID WC  . traZODone  25-100 mg Oral QHS   Elmarie Shiley, MD 04/02/2019, 8:41 AM

## 2019-04-02 NOTE — Consult Note (Addendum)
Physical Medicine and Rehabilitation Consult Reason for Consult: Impaired mobility and ADLs Referring Physician: Dr. Debbe Odea   HPI: [name] is a 73 y.o. female who was previously independent, with a PMH of CAD/ICM, stage IV CKD (baseline creatinine 2.7-3.1), HTN, HLD, DM, lower extremity PAD (bilateral SFA occlusion 07/2018), chronic bilateral feet ulcerations, osteomyelitis of the left toe, and LBBB, who has a chronic would on her left foot that is followed at the wound care center. She was recently noticed to wave worsening infection and started on IV antibiotics including vancomycin at dialysis, but was sent to [facility] from her wound care center due to low-grade fevers and increased drainage from the ulcer. She was admitted for left toe osteomyelitis, abscess, and sepsis complicating PAD. Nephrology following for hemodialysis needs. S/p 12/18 aortogram with bilateral lower extremity arteriogram with runoff, followed by left CFA endarterectomy and Dacron patch angioplasty, left femoral to below-knee popliteal bypass graft on 12/21. Orthopedics/Dr. Sharol Given do not plan on doing any further procedure in the hospital.    ROS +lower back pain with sciatic. Otherwise negative.  Past Medical History:  Diagnosis Date  . Allergy   . Arthritis    "maybe in my lower back" (08/27/2012)  . 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 Chronic combined systolic and diastolic CHF (congestive heart failure) ([place])   . CKD (chronic kidney disease), stage IV ([place])   . Coronary artery disease    a. NSTEMI in 05/2009 s/p CABG (LIMA-LAD, SVG-diagonal, SVG-OM1/OM 2).   Marland Kitchen Dyspnea    with exertion  . Family history of anesthesia complication    Mom has a hard time to wake up  . Foot ulcer ([place])    "I've had them on both feet" (08/27/2012)  . Gouty arthritis    "right index finger" (08/27/2012)  . History of radiation therapy 06/09/13-07/06/13   left  breast 50Gy  . Hyperlipidemia   . Hypertension   . Infection    right second toe  . Ischemic cardiomyopathy    a. 2011: EF 40-45%. b. EF 55-60% in 04/2017 but shortly after as inpatient was 45-50%.  Marland Kitchen LBBB (left bundle branch block)   . Mitral regurgitation   . Neuropathy    Hx; of B/L feet  . NSTEMI (non-ST elevated myocardial infarction) ([place]) 05/23/2009  . Osteomyelitis of foot ([place])   . PAD (peripheral artery disease) ([facility])    a. LE PAD (patient previously elected hold off L fem-pop), prev followed by Dr. Bridgett Larsson  . PONV (postoperative nausea and vomiting)   . Sinus headache    "occasionally" (08/27/2012)  . Type II diabetes mellitus ([place])   . Vitamin D deficiency    Past Surgical History:  Procedure Laterality Date  . ABDOMINAL AORTOGRAM W/LOWER EXTREMITY Bilateral 03/25/2019   Procedure: ABDOMINAL AORTOGRAM W/LOWER EXTREMITY;  Surgeon: Marty Heck, MD;  Location: [facility];  Service: Cardiovascular;  Laterality: Bilateral;  . AMPUTATION Right 08/27/2012   Procedure: AMPUTATION RAY;  Surgeon: Newt Minion, MD;  Location: [place];  Service: Orthopedics;  Laterality: Right;  Right Foot 5th Ray Amputation  . AMPUTATION Left 07/08/2013   Procedure: AMPUTATION RAY;  Surgeon: Newt Minion, MD;  Location: [place];  Service: Orthopedics;  Laterality: Left;  Left Foot 2nd Ray Amputation  . AMPUTATION RAY Right 08/27/2012   5th ray/notes 08/27/2012  . AMPUTATION TOE Right 03/06/2017   Procedure: AMPUTATION TOE, INTERPHANGEAL  2ND RIGHT;  Surgeon: [name], DPM;  Location: [place];  Service: Podiatry;  Laterality: Right;  . AV FISTULA PLACEMENT Right 11/11/2017   Procedure: RIGHT RADIOCEPHALIC  ARTERIOVENOUS FISTULA;  Surgeon: [name], MD;  Location: [place];  Service: Vascular;  Laterality: Right;  . BREAST LUMPECTOMY  04/20/2013   with biopsy      DR WAKEFIELD  . BREAST LUMPECTOMY WITH NEEDLE LOCALIZATION AND AXILLARY SENTINEL LYMPH NODE BX Left 04/20/2013   Procedure:  LEFT BREAST WIRE GUIDED LUMPECTOMY AND AXILLARY SENTINEL LYMPH NODE BX;  Surgeon: Rolm Bookbinder, MD;  Location: [place];  Service: General;  Laterality: Left;  . CARDIAC CATHETERIZATION  05/24/2009   Archie Endo 05/24/2009 (08/27/2012)  . CATARACT EXTRACTION W/ INTRAOCULAR LENS  IMPLANT, BILATERAL  2000's  . COLONOSCOPY W/ BIOPSIES AND POLYPECTOMY  2010  . CORONARY ARTERY BYPASS GRAFT  2011   "CABG X4" (08/27/2012)  . DENTAL SURGERY  04/30/11   "1 implant" (08/27/2012)  . DILATION AND CURETTAGE OF UTERUS    . ENDARTERECTOMY FEMORAL Left 03/28/2019   Procedure: ENDARTERECTOMY FEMORAL;  Surgeon: [name], MD;  Location: [place];  Service: Vascular;  Laterality: Left;  . EYE SURGERY    . FEMORAL-POPLITEAL BYPASS GRAFT Left 03/28/2019   Procedure: BYPASS GRAFT FEMORAL-POPLITEAL ARTERY;  Surgeon: [name], MD;  Location: [place];  Service: Vascular;  Laterality: Left;  . FINGER SURGERY Right    "index finger; turned out to be gout" (08/27/2012)  . IR FLUORO GUIDE CV LINE RIGHT  11/30/2017  . PATCH ANGIOPLASTY Left 03/28/2019   Procedure: Patch Angioplasty;  Surgeon: [name], MD;  Location: [place];  Service: Vascular;  Laterality: Left;  . TEE WITHOUT CARDIOVERSION N/A 12/01/2017   Procedure: TRANSESOPHAGEAL ECHOCARDIOGRAM (TEE);  Surgeon: Lelon Perla, MD;  Location[facility];  Service: Cardiovascular;  Laterality: N/A;   Family History  Problem Relation Age of Onset  . Kidney cancer Brother 10  . Hypertension Brother   . Breast cancer Sister 58       LCIS; BRCA negative  . Throat cancer Father 71       smoker  . Breast cancer Sister 43       Lobular breast cancer  . Breast cancer Maternal Aunt        dx in her 2s   Social History:  reports that she has quit smoking. She has never used smokeless tobacco. She reports current alcohol use. She reports that she does not use drugs. Allergies:  Allergies  Allergen Reactions  . Atenolol Other (See Comments) and Rash     Exacerbates gout Exacerbates gout  . Adhesive [Tape] Other (See Comments)    SKIN IS VERY SENSITIVE AND BRUISES AND TEARS EASILY; PLEASE USE AN ALTERNATIVE TO TAPE!!  . Contrast Media [Iodinated Diagnostic Agents] Rash  . Iohexol Rash and Other (See Comments)  . Latex Rash   Medications Prior to Admission  Medication Sig Dispense Refill  . allopurinol (ZYLOPRIM) 100 MG tablet Take 1 tablet 2 x /day to Prevent Gout (Patient taking differently: Take 100 mg by mouth 2 (two) times daily. ) 180 tablet 3  . aspirin EC 81 MG tablet Take 1 tablet (81 mg total) by mouth daily.    . Aspirin-Acetaminophen-Caffeine (EXCEDRIN EXTRA STRENGTH PO) Take 2 tablets by mouth daily as needed (pain).     Marland Kitchen b complex-vitamin c-folic acid (NEPHRO-VITE) 0.8 MG TABS tablet Take 1 tablet by mouth 3 (three) times a week.  Tuesday, Thursday, Saturday    . cinacalcet (SENSIPAR) 30 MG tablet Take 30 mg by mouth 3 (three) times a week. Takes 1 tablet 3 days a week. Tues, Thur, Sat    . docusate sodium (COLACE) 100 MG capsule Take 100 mg by mouth at bedtime.    . fluticasone (FLONASE) 50 MCG/ACT nasal spray Place 2 sprays into both nostrils daily as needed for allergies or rhinitis.    . hydrALAZINE (APRESOLINE) 25 MG tablet Take 3 tablets (75 mg) 3 x /day for BP & Heart 810 tablet 3  . insulin NPH-regular Human (70-30) 100 UNIT/ML injection Inject 10-30 Units into the skin 2 (two) times daily with a meal.    . isosorbide dinitrate (ISORDIL) 10 MG tablet Take 1 tablet 3 x /day for BP & Heart (Patient taking differently: Take 10 mg by mouth 3 (three) times daily. Take 1 tablet 3 x /day for BP & Heart) 270 tablet 3  . loratadine (CLARITIN) 10 MG tablet Take 10 mg by mouth daily.    . Multiple Vitamins-Minerals (PRESERVISION AREDS 2 PO) Take 2 capsules by mouth daily.    . Probiotic Product (PROBIOTIC DAILY PO) Take 1 tablet by mouth daily.    . psyllium (METAMUCIL) 58.6 % powder Take 1 packet by mouth 3 (three) times daily.      . sevelamer carbonate (RENVELA) 800 MG tablet Take 1,600 mg by mouth 3 (three) times daily with meals.     . traZODone (DESYREL) 50 MG tablet 1/2-2 tablets 1 hour prior to bedtime for sleep. (Patient taking differently: Take 25-100 mg by mouth at bedtime. 1/2-2 tablets 1 hour prior to bedtime for sleep.) 60 tablet 2  . [DISCONTINUED] HYDROcodone-acetaminophen (NORCO/VICODIN) 5-325 MG tablet Take 0.5-1 tablets by mouth every 6 (six) hours as needed for severe pain. 20 tablet 0  . BD VEO INSULIN SYRINGE U/F 31G X 15/64" 1 ML MISC USE AS DIRECTED WITH INSULIN 300 each 3  . glucose blood (PRODIGY NO CODING BLOOD GLUC) test strip CHECK SUGARS 3 TIMES A DAY.[id number] 300 each 3    Home: Home Living Family/patient expects to be discharged to:: Private residence Living Arrangements: Spouse/significant other Available Help at Discharge: Family, Available 24 hours/day Type of Home: House Home Access: Stairs to enter [employer] of Steps: 1 onto porch then 1 into main house, 2 steps into kitchen Home Layout: Two level, Able to live on main level with bedroom/bathroom Bathroom Shower/Tub: Multimedia programmer: Handicapped height Home Equipment: Environmental consultant - 2 wheels, Transport chair, Wheelchair - manual, Grab bars - toilet Additional Comments: Husband currently recovering from THA and using walker. Son visiting from out of town to provide assist as needed  Functional History: Prior Function Level of Independence: Independent with assistive device(s) Comments: Indep with household ambulation, uses walking stick outside. Sponge bathes Functional Status:  Mobility: Bed Mobility Overal bed mobility: Needs Assistance Bed Mobility: Supine to Sit, Sit to Supine Supine to sit: Mod assist Sit to supine: Mod assist General bed mobility comments: Improved ability to maneuver LLE to EOB with minA, modA for trunk elevation; modA for LEs return to supine Transfers Overall transfer level:  Needs assistance Equipment used: Rolling walker (2 wheeled), 1 person hand held assist Transfers: Sit to/from Stand Sit to Stand: Max assist, From elevated surface General transfer comment: Stood to RW reliant on UE support of therapist and maxA to elevate trunk, increased time to stablize and correct posterior lean, increased time to WB through LLE Ambulation/Gait Ambulation/Gait  assistance: Mod assist Gait Distance (Feet): 1 Feet Assistive device: Rolling walker (2 wheeled) Gait Pattern/deviations: Step-to pattern, Decreased weight shift to left, Antalgic, Leaning posteriorly, Trunk flexed General Gait Details: Pt able to take side steps towards HOB with RW, requiring modA to maintain balance when WB through LLE in order to take step with R foot. Able to take complete step with R foot, reliant on sliding L foot Gait velocity: Decreased    ADL: ADL Overall ADL's : Needs assistance/impaired Grooming: Sitting, Min guard Upper Body Bathing: Min guard, Sitting Lower Body Bathing: Maximal assistance, Sitting/lateral leans Upper Body Dressing : Min guard, Sitting Lower Body Dressing: Maximal assistance, Total assistance, Sitting/lateral leans Lower Body Dressing Details (indicate cue type and reason): unable to don sock to L foot seated EOB Toilet Transfer: Maximal assistance, +2 for physical assistance, +2 for safety/equipment, BSC, RW Toilet Transfer Details (indicate cue type and reason): simulated with sit to stand, unable first two attempts, with max A able to fully extend at hips however limited balance, activity tolerance, strength. currently recommend x2 to bedside commode Toileting- Clothing Manipulation and Hygiene: Total assistance, Sitting/lateral lean Functional mobility during ADLs: Maximal assistance General ADL Comments: due to decreased strength, activity tolerance, safety patient requiring increased assistance with self care  Cognition: Cognition Overall Cognitive Status:  Impaired/Different from baseline Orientation Level: Oriented X4 Cognition Arousal/Alertness: Awake/alert Behavior During Therapy: WFL for tasks assessed/performed Overall Cognitive Status: Impaired/Different from baseline Area of Impairment: Attention, Problem solving, Memory Current Attention Level: Selective Memory: Decreased short-term memory Problem Solving: Requires verbal cues General Comments: Pt aware of apparent cognitive deficits today, increased fatigue secondary to HD this AM and distracted by pain  Blood pressure 131/62, pulse 82, temperature 98.4 F (36.9 C), temperature source Oral, resp. rate 19, height '5\' 4"'  (1.626 m), weight 72.9 kg, SpO2 99 %.   Physical Exam General: Alert and oriented x 3, No apparent distress HEENT: Head is normocephalic, atraumatic, PERRLA, EOMI, sclera anicteric, oral mucosa pink and moist, dentition intact, ext ear canals clear,  Neck: Supple without JVD or lymphadenopathy Heart: Reg rate and rhythm. No murmurs rubs or gallops Chest: CTA bilaterally without wheezes, rales, or rhonchi; no distress Abdomen: Soft, non-tender, non-distended, bowel sounds positive. Extremities: No clubbing, cyanosis, or edema. Pulses are 2+ Skin: left foot with ulcer on 5th MTP joint, minimal bloody and yellow discharge.  Neuro: Pt is cognitively appropriate with normal insight, memory, and awareness. Cranial nerves 2-12 are intact. Grossly 5/5 strength throughout.  Psych: Pt's affect is appropriate. Pt is cooperative  Results for orders placed or performed during the hospital encounter of 03/23/19 (from the past 24 hour(s))  Glucose, capillary     Status: Abnormal   Collection Time: 04/01/19  9:26 PM  Result Value Ref Range   Glucose-Capillary 163 (H) 70 - 99 mg/dL  Glucose, capillary     Status: Abnormal   Collection Time: 04/02/19  6:14 AM  Result Value Ref Range   Glucose-Capillary 101 (H) 70 - 99 mg/dL  Glucose, capillary     Status: Abnormal   Collection  Time: 04/02/19 11:06 AM  Result Value Ref Range   Glucose-Capillary 143 (H) 70 - 99 mg/dL  Glucose, capillary     Status: Abnormal   Collection Time: 04/02/19  4:11 PM  Result Value Ref Range   Glucose-Capillary 122 (H) 70 - 99 mg/dL   No results found.   Assessment/Plan: Diagnosis: Impaired mobility and ADLs following left femoral endarterectomy with femoral to BK pop  bypass 1. Does the need for close, 24 hr/day medical supervision in concert with the patient's rehab needs make it unreasonable for this patient to be served in a less intensive setting? Yes 2. Co-Morbidities requiring supervision/potential complications: Right leg and foot ulcers, HTN, DM2, ESRD on HD, chronic systolic HF, gout, anemia of chronic disease 3. Due to bladder management, bowel management, safety, skin/wound care, disease management, medication administration, pain management and patient education, does the patient require 24 hr/day rehab nursing? Yes 4. Does the patient require coordinated care of a physician, rehab nurse, therapy disciplines of PT, OT to address physical and functional deficits in the context of the above medical diagnosis(es)? Yes Addressing deficits in the following areas: balance, endurance, locomotion, strength, transferring, bathing, dressing, feeding, grooming, toileting and psychosocial support 5. Can the patient actively participate in an intensive therapy program of at least 3 hrs of therapy per day at least 5 days per week? Yes 6. The potential for patient to make measurable gains while on inpatient rehab is good 7. Anticipated functional outcomes upon discharge from inpatient rehab are modified independent  with PT, modified independent with OT, independent with SLP. 8. Estimated rehab length of stay to reach the above functional goals is: 10-14 days 9. Anticipated discharge destination: Home 10. Overall Rehab/Functional Prognosis: good  RECOMMENDATIONS: This patient's condition is  appropriate for continued rehabilitative care in the following setting: CIR Patient has agreed to participate in recommended program. Yes Note that insurance prior authorization may be required for reimbursement for recommended care.  Comment: Mrs. Hamon was previously independent and now significantly impaired--ambulated 1 foot Mod A RW with PT, ModA in bed mobility. She has significant comorbidities and would benefit from CIR. Her husband will be able to care for her at home. He received hip surgery 1 week ago but is recovering well. In the event that he couldn't help her, her sister would be able to help.  [name], MD 04/02/2019

## 2019-04-03 LAB — CBC
HCT: 25.4 % — ABNORMAL LOW (ref 36.0–46.0)
Hemoglobin: 8 g/dL — ABNORMAL LOW (ref 12.0–15.0)
MCH: 31.3 pg (ref 26.0–34.0)
MCHC: 31.5 g/dL (ref 30.0–36.0)
MCV: 99.2 fL (ref 80.0–100.0)
Platelets: 246 10*3/uL (ref 150–400)
RBC: 2.56 MIL/uL — ABNORMAL LOW (ref 3.87–5.11)
RDW: 17.8 % — ABNORMAL HIGH (ref 11.5–15.5)
WBC: 8.7 10*3/uL (ref 4.0–10.5)
nRBC: 0 % (ref 0.0–0.2)

## 2019-04-03 LAB — RENAL FUNCTION PANEL
Albumin: 2.4 g/dL — ABNORMAL LOW (ref 3.5–5.0)
Anion gap: 14 (ref 5–15)
BUN: 41 mg/dL — ABNORMAL HIGH (ref 8–23)
CO2: 25 mmol/L (ref 22–32)
Calcium: 7.3 mg/dL — ABNORMAL LOW (ref 8.9–10.3)
Chloride: 94 mmol/L — ABNORMAL LOW (ref 98–111)
Creatinine, Ser: 5.19 mg/dL — ABNORMAL HIGH (ref 0.44–1.00)
GFR calc Af Amer: 9 mL/min — ABNORMAL LOW (ref >60)
GFR calc non Af Amer: 8 mL/min — ABNORMAL LOW (ref >60)
Glucose, Bld: 146 mg/dL — ABNORMAL HIGH (ref 70–99)
Phosphorus: 4.7 mg/dL — ABNORMAL HIGH (ref 2.5–4.6)
Potassium: 3.9 mmol/L (ref 3.5–5.1)
Sodium: 133 mmol/L — ABNORMAL LOW (ref 135–145)

## 2019-04-03 LAB — GLUCOSE, CAPILLARY
Glucose-Capillary: 101 mg/dL — ABNORMAL HIGH (ref 70–99)
Glucose-Capillary: 148 mg/dL — ABNORMAL HIGH (ref 70–99)
Glucose-Capillary: 81 mg/dL (ref 70–99)
Glucose-Capillary: 85 mg/dL (ref 70–99)
Glucose-Capillary: 96 mg/dL (ref 70–99)

## 2019-04-03 MED ORDER — HEPARIN SODIUM (PORCINE) 1000 UNIT/ML DIALYSIS: 40.0000 [IU]/kg | INTRAMUSCULAR | Status: DC | PRN

## 2019-04-03 MED ORDER — DOXERCALCIFEROL 4 MCG/2ML IV SOLN
INTRAVENOUS | Status: AC
  Administered 2019-04-03: 4 ug via INTRAVENOUS
  Filled 2019-04-03: qty 2

## 2019-04-03 NOTE — Procedures (Signed)
Patient seen on Hemodialysis. BP (!) 116/53   Pulse 75   Temp (!) 97.5 F (36.4 C) (Oral)   Resp 16   Ht 5\' 4"  (1.626 m)   Wt 72.1 kg   SpO2 97%   BMI 27.28 kg/m   QB 300, UF goal 2L Tolerating treatment without complaints at this time. Accepted for CIR admission pending insurance clarification.   Elmarie Shiley MD [facility]. Office # [phone] Pager # [phone] 9:31 AM

## 2019-04-03 NOTE — Progress Notes (Addendum)
  Progress Note    04/03/2019 8:08 AM 6 Days Post-Op   Subjective:  No complaints; took a pain pill prior to coming to HD  afebrile  Vitals:   04/02/19 1915 04/03/19 0430  BP: (!) 118/51 133/66  Pulse: 76 81  Resp: 15 16  Temp: 98.1 F (36.7 C) 98.4 F (36.9 C)  SpO2: 99% 97%    Physical Exam: General:  Pt seen on HD and resting comfortably Lungs:  Non labored Incisions:  All incisions are clean and dry Extremities:  Left foot is warm and well perfused.   CBC    Component Value Date/Time   WBC 8.7 04/03/2019 0729   RBC 2.56 (L) 04/03/2019 0729   HGB 8.0 (L) 04/03/2019 0729   HGB 11.7 05/18/2017 1218   HGB 12.3 02/02/2017 1136   HCT 25.4 (L) 04/03/2019 0729   HCT 34.7 05/18/2017 1218   HCT 37.4 02/02/2017 1136   PLT 246 04/03/2019 0729   PLT 227 05/18/2017 1218   MCV 99.2 04/03/2019 0729   MCV 87 05/18/2017 1218   MCV 90.6 02/02/2017 1136   MCH 31.3 04/03/2019 0729   MCHC 31.5 04/03/2019 0729   RDW 17.8 (H) 04/03/2019 0729   RDW 16.0 (H) 05/18/2017 1218   RDW 15.7 (H) 02/02/2017 1136   LYMPHSABS 1.5 03/23/2019 1355   LYMPHSABS 1.8 02/02/2017 1136   MONOABS 1.1 (H) 03/23/2019 1355   MONOABS 0.8 02/02/2017 1136   EOSABS 0.1 03/23/2019 1355   EOSABS 0.3 02/02/2017 1136   BASOSABS 0.0 03/23/2019 1355   BASOSABS 0.1 02/02/2017 1136    BMET    Component Value Date/Time   NA 133 (L) 04/03/2019 0730   NA 143 05/18/2017 1218   NA 143 02/02/2017 1137   K 3.9 04/03/2019 0730   K 3.8 02/02/2017 1137   CL 94 (L) 04/03/2019 0730   CO2 25 04/03/2019 0730   CO2 24 02/02/2017 1137   GLUCOSE 146 (H) 04/03/2019 0730   GLUCOSE 74 02/02/2017 1137   BUN 41 (H) 04/03/2019 0730   BUN 76 (HH) 05/18/2017 1218   BUN 63.4 (H) 02/02/2017 1137   CREATININE 5.19 (H) 04/03/2019 0730   CREATININE 3.17 (H) 01/10/2019 1525   CREATININE 3.0 (HH) 02/02/2017 1137   CALCIUM 7.3 (L) 04/03/2019 0730   CALCIUM 9.9 02/02/2017 1137   GFRNONAA 8 (L) 04/03/2019 0730   GFRNONAA 14  (L) 01/10/2019 1525   GFRAA 9 (L) 04/03/2019 0730   GFRAA 16 (L) 01/10/2019 1525    INR    Component Value Date/Time   INR 1.1 03/28/2019 0329     Intake/Output Summary (Last 24 hours) at 04/03/2019 9702 Last data filed at 04/02/2019 2101 Gross per 24 hour  Intake 280 ml  Output --  Net 280 ml     Assessment:  73 y.o. female is s/p:  L CFA endarterectomy and femoral to BK pop bypass with vein  6 Days Post-Op  Plan: -pt left foot is warm and incisions are healing nicely.  -approved for CIR pending insurance -f/u with vascular surgery in 2-3 weeks-our office will arrange appt.     Leontine Locket, PA-C [facility] [phone] 04/03/2019 8:08 AM  I have seen and evaluated the patient. I agree with the PA note as documented above.   Marty Heck, MD [facility] [place] Office: [phone] Pager: [phone]

## 2019-04-03 NOTE — Progress Notes (Signed)
PROGRESS NOTE    [name]   IRJ:[id number]  DOB: 1945/11/12  DOA: 03/23/2019 PCP: Unk Pinto, MD   Brief Narrative:  [name] 73 year old married female, independent, PMH of CAD s/p CABG, chronic combined CHF/ICM, stage IV CKD (baseline creatinine 2.7-3.1), HTN, HLD, DM, lower extremity PAD (bilateral SFA occlusion 07/2018), chronic bilateral feet ulcerations, osteomyelitis of left toe, LBBB who has had a chronic wound on her left foot that is followed at the wound care center closely, recently noted worsening infection and started on IV antibiotics including vancomycin at dialysis (reportedly completed 3 doses) sent to [facility] from the wound care center due to low-grade fevers and increased drainage from the ulcer.  Admitted for left little toe osteomyelitis, abscess and sepsis complicating PAD.  Nephrology consulted for HD needs.  S/p aortogram with bilateral lower extremity arteriogram with runoff 12/18 followed by left CFA endarterectomy and Dacron patch angioplasty, left femoral to below-knee popliteal bypass graft on 12/21.  Orthopedics/Dr. Sharol Given plans surgical intervention after vascular procedure.    Subjective: No complaints today.   Assessment & Plan:   Principal Problem:   Osteomyelitis-septic arthritis left fifth metatarsal joint, abscess and ulcer in setting of Peripheral artery disease -Patient has failed outpatient antibiotic treatment and has been started on vancomycin and Zosyn in the hospital -Outpatient wound culture grew out Serratia-blood cultures have been negative- transitioned to Cefepime on 12/25 -5/15-MRI left foot: 1. Septic arthritis at the fifth MTP joint with osteomyelitis involving the distal half of the fifth metatarsal and also the proximal phalanx. 2. Surrounding inflammatory phlegmon and abscess. 3. Diffuse cellulitis and myofasciitis. -12/18-lower extremity arteriogram revealed 70% stenosis of the left common femoral  artery with heavily calcified plaque, SFA occluded throughout proximal mid and distal segment-50% right common femoral artery stenosis and right SFA occlusion throughout the entire segment- - 12/21-patient underwent left CFA endarterectomy and Dacron patch angioplasty and left below the knee popliteal bypass graft -Orthopedic surgery team does not plan on doing any further procedures in the hospital and will follow up on the patient in about a week in the office - plan to d/c to SNF with antibiotics with dialysis- awaiting SNF bed  Active Problems: Ulcers on right leg and foot -These are suspected to be ischemic ulcers -The patient states that these have been present for many months and apparently are healing slowly -She follows as outpatient with the wound care center    Essential hypertension -Continue current medications which include hydralazine and isosorbide dinitrate    Diabetes mellitus type 2, insulin dependent  -Last A1c was 6.3-at home she takes 70/30 insulin Continue Levemir and sliding scale insulin in the hospital    ESRD on hemodialysis  -Receives dialysis on Tuesday Thursday Saturday and received at this morning  Chronic systolic heart failure -Fluid management is with dialysis  Gout -Continue allopurinol  Anemia of chronic disease -Stable   Time spent in minutes: 35 DVT prophylaxis: SCDs Code Status: Full code Family Communication: None at bedside Disposition Plan: -continue to treat infection- awaiting SNF Consultants:   Vascular surgery  Orthopedic surgery  Nephrology Procedures:   Dr. Carlis Abbott, VVS S/p aortogram with bilateral lower extremity arteriogram with runoff 12/18.   Dr. Donnetta Hutching, VVS S/P left CFA endarterectomy and Dacron patch angioplasty, left femoral to below-knee popliteal bypass graft on 12/21.  HD Antimicrobials:  Anti-infectives (From admission, onward)   Start     Dose/Rate Route Frequency Ordered Stop   04/01/19 2200  ceFEPIme  (  MAXIPIME) 1 g in sodium chloride 0.9 % 100 mL IVPB     1 g 200 mL/hr over 30 Minutes Intravenous Every 24 hours 04/01/19 1516     03/31/19 0000  Vancomycin (VANCOCIN) 750-5 MG/150ML-% SOLN     750 mg Intravenous Every T-Th-Sa (Hemodialysis) 03/30/19 1211     03/30/19 0000  piperacillin-tazobactam (ZOSYN) 2-0.25 GM/50ML IVPB     2.25 g Intravenous Every 8 hours 03/30/19 1211     03/29/19 1200  vancomycin (VANCOCIN) IVPB 750 mg/150 ml premix  Status:  Discontinued     750 mg 150 mL/hr over 60 Minutes Intravenous Every T-Th-Sa (Hemodialysis) 03/26/19 2303 04/01/19 1507   03/28/19 0600  cefUROXime (ZINACEF) 1.5 g in sodium chloride 0.9 % 100 mL IVPB     1.5 g 200 mL/hr over 30 Minutes Intravenous On call to O.R. 03/27/19 1132 03/28/19 1453   03/26/19 2359  vancomycin (VANCOREADY) IVPB 500 mg/100 mL     500 mg 100 mL/hr over 60 Minutes Intravenous  Once 03/26/19 2303 03/27/19 0716   03/25/19 1730  vancomycin (VANCOCIN) IVPB 750 mg/150 ml premix     750 mg 150 mL/hr over 60 Minutes Intravenous  Once 03/25/19 1513 03/25/19 1627   03/24/19 1800  piperacillin-tazobactam (ZOSYN) IVPB 2.25 g     2.25 g 100 mL/hr over 30 Minutes Intravenous Every 8 hours 03/24/19 1631 04/01/19 1700   03/24/19 0000  vancomycin variable dose per unstable renal function (pharmacist dosing)  Status:  Discontinued      Does not apply See admin instructions 03/23/19 1528 03/29/19 0720   03/23/19 1530  vancomycin (VANCOREADY) IVPB 1500 mg/300 mL     1,500 mg 150 mL/hr over 120 Minutes Intravenous  Once 03/23/19 1524 03/23/19 1949   03/23/19 1515  piperacillin-tazobactam (ZOSYN) IVPB 3.375 g     3.375 g 100 mL/hr over 30 Minutes Intravenous  Once 03/23/19 1504 03/23/19 1556       Objective: Vitals:   04/03/19 0900 04/03/19 0930 04/03/19 1000 04/03/19 1017  BP: (!) 116/53 125/65 (!) 113/52 (!) 109/52  Pulse: 75 80 78 80  Resp:    20  Temp:    98.2 F (36.8 C)  TempSrc:    Oral  SpO2:    98%  Weight:    69.7  kg  Height:        Intake/Output Summary (Last 24 hours) at 04/03/2019 1125 Last data filed at 04/03/2019 1017 Gross per 24 hour  Intake 100 ml  Output 1750 ml  Net -1650 ml   Filed Weights   04/03/19 0427 04/03/19 0642 04/03/19 1017  Weight: 72.9 kg 72.1 kg 69.7 kg    Examination: General exam: Appears comfortable  HEENT: PERRLA, oral mucosa moist, no sclera icterus or thrush Respiratory system: Clear to auscultation. Respiratory effort normal. Cardiovascular system: S1 & S2 heard,  No murmurs  Gastrointestinal system: Abdomen soft, non-tender, nondistended. Normal bowel sounds   Central nervous system: Alert and oriented. No focal neurological deficits. Extremities: No cyanosis, clubbing or edema Skin: see below Psychiatry:  Mood & affect appropriate.      Data Reviewed: I have personally reviewed following labs and imaging studies  CBC: Recent Labs  Lab 03/28/19 0329 03/29/19 0508 03/31/19 0715 04/03/19 0729  WBC 8.5 11.5* 10.9* 8.7  HGB 12.7 8.0* 7.3* 8.0*  HCT 39.0 24.7* 22.4* 25.4*  MCV 94.4 95.4 96.6 99.2  PLT 298 238 221 366   Basic Metabolic Panel: Recent Labs  Lab 03/28/19 0329 03/29/19  4098 03/31/19 0715 04/03/19 0730  NA 137 134* 134* 133*  K 3.4* 4.9 4.0 3.9  CL 94* 97* 96* 94*  CO2 26 21* 23 25  GLUCOSE 180* 172* 104* 146*  BUN 41* 51* 38* 41*  CREATININE 3.56* 4.21* 4.37* 5.19*  CALCIUM 8.0* 7.0* 7.3* 7.3*  PHOS  --   --  5.0* 4.7*   GFR: Estimated Creatinine Clearance: 9.3 mL/min (A) (by C-G formula based on SCr of 5.19 mg/dL (H)). Liver Function Tests: Recent Labs  Lab 03/31/19 0715 04/03/19 0730  ALBUMIN 2.3* 2.4*   No results for input(s): LIPASE, AMYLASE in the last 168 hours. No results for input(s): AMMONIA in the last 168 hours. Coagulation Profile: Recent Labs  Lab 03/28/19 0329  INR 1.1   Cardiac Enzymes: No results for input(s): CKTOTAL, CKMB, CKMBINDEX, TROPONINI in the last 168 hours. BNP (last 3 results) No  results for input(s): PROBNP in the last 8760 hours. HbA1C: No results for input(s): HGBA1C in the last 72 hours. CBG: Recent Labs  Lab 04/02/19 1106 04/02/19 1611 04/02/19 2124 04/03/19 0547 04/03/19 1111  GLUCAP 143* 122* 159* 96 101*   Lipid Profile: No results for input(s): CHOL, HDL, LDLCALC, TRIG, CHOLHDL, LDLDIRECT in the last 72 hours. Thyroid Function Tests: No results for input(s): TSH, T4TOTAL, FREET4, T3FREE, THYROIDAB in the last 72 hours. Anemia Panel: No results for input(s): VITAMINB12, FOLATE, FERRITIN, TIBC, IRON, RETICCTPCT in the last 72 hours. Urine analysis:    Component Value Date/Time   COLORURINE YELLOW 01/10/2019 [place] 01/10/2019 1525   LABSPEC 1.019 01/10/2019 1525   PHURINE 7.5 01/10/2019 1525   GLUCOSEU TRACE (A) 01/10/2019 1525   HGBUR NEGATIVE 01/10/2019 1525   BILIRUBINUR NEGATIVE 08/25/2018 0045   KETONESUR NEGATIVE 01/10/2019 1525   PROTEINUR 3+ (A) 01/10/2019 1525   UROBILINOGEN 0.2 05/30/2009 1500   NITRITE NEGATIVE 01/10/2019 1525   LEUKOCYTESUR NEGATIVE 01/10/2019 1525   Sepsis Labs: @LABRCNTIP (procalcitonin:4,lacticidven:4) ) No results found for this or any previous visit (from the past 240 hour(s)).       Radiology Studies: No results found.    Scheduled Meds: . allopurinol  100 mg Oral BID  . aspirin EC  81 mg Oral Daily  . atorvastatin  10 mg Oral q1800  . Chlorhexidine Gluconate Cloth  6 each Topical Q0600  . cinacalcet  30 mg Oral Q supper  . darbepoetin (ARANESP) injection - DIALYSIS  60 mcg Intravenous Q Tue-HD  . doxercalciferol  4 mcg Intravenous Q T,Th,Sa-HD  . feeding supplement (NEPRO CARB STEADY)  237 mL Oral BID BM  . hydrALAZINE  25 mg Oral Q8H  . insulin aspart  0-15 Units Subcutaneous TID WC  . insulin aspart  0-5 Units Subcutaneous QHS  . insulin detemir  10 Units Subcutaneous Daily  . isosorbide dinitrate  10 mg Oral TID  . multivitamin  1 tablet Oral QHS  . pantoprazole  40  mg Oral Daily  . polyethylene glycol  17 g Oral Daily  . sevelamer carbonate  1,600 mg Oral TID WC  . traZODone  25-100 mg Oral QHS   Continuous Infusions: . sodium chloride    . ceFEPime (MAXIPIME) IV Stopped (04/02/19 2101)  . magnesium sulfate bolus IVPB       LOS: 11 days      Debbe Odea, MD [facility] Pager: [email or web address] Password [email or web address] 04/03/2019, 11:25 AM

## 2019-04-04 ENCOUNTER — Other Ambulatory Visit: Payer: Self-pay | Admitting: Physician Assistant

## 2019-04-04 LAB — GLUCOSE, CAPILLARY
Glucose-Capillary: 117 mg/dL — ABNORMAL HIGH (ref 70–99)
Glucose-Capillary: 159 mg/dL — ABNORMAL HIGH (ref 70–99)
Glucose-Capillary: 141 mg/dL — ABNORMAL HIGH (ref 70–99)
Glucose-Capillary: 97 mg/dL (ref 70–99)

## 2019-04-04 MED ORDER — PRO-STAT SUGAR FREE PO LIQD
30.0000 mL | Freq: Two times a day (BID) | ORAL | Status: DC
  Administered 2019-04-04 – 2019-04-07 (×6): 30 mL via ORAL
  Filled 2019-04-04 (×7): qty 30

## 2019-04-04 NOTE — Progress Notes (Signed)
Encouraged patient to get into chair, patient declined at this time. Will monitor patient. Suzy Kugel, Bettina Gavia RN

## 2019-04-04 NOTE — Progress Notes (Addendum)
  Progress Note    04/04/2019 7:27 AM 7 Days Post-Op  Subjective:  No rest pain L foot.  Pain along incisions improving.   Vitals:   04/04/19 0258 04/04/19 0642  BP:  113/60  Pulse:  78  Resp: 12 19  Temp:  98 F (36.7 C)  SpO2:  99%   Physical Exam: Lungs:  Non labored  Incisions:  L groin and incisions of LLE c/d/i Extremities:  L PT and DP by doppler Neurologic: A&O  CBC    Component Value Date/Time   WBC 8.7 04/03/2019 0729   RBC 2.56 (L) 04/03/2019 0729   HGB 8.0 (L) 04/03/2019 0729   HGB 11.7 05/18/2017 1218   HGB 12.3 02/02/2017 1136   HCT 25.4 (L) 04/03/2019 0729   HCT 34.7 05/18/2017 1218   HCT 37.4 02/02/2017 1136   PLT 246 04/03/2019 0729   PLT 227 05/18/2017 1218   MCV 99.2 04/03/2019 0729   MCV 87 05/18/2017 1218   MCV 90.6 02/02/2017 1136   MCH 31.3 04/03/2019 0729   MCHC 31.5 04/03/2019 0729   RDW 17.8 (H) 04/03/2019 0729   RDW 16.0 (H) 05/18/2017 1218   RDW 15.7 (H) 02/02/2017 1136   LYMPHSABS 1.5 03/23/2019 1355   LYMPHSABS 1.8 02/02/2017 1136   MONOABS 1.1 (H) 03/23/2019 1355   MONOABS 0.8 02/02/2017 1136   EOSABS 0.1 03/23/2019 1355   EOSABS 0.3 02/02/2017 1136   BASOSABS 0.0 03/23/2019 1355   BASOSABS 0.1 02/02/2017 1136    BMET    Component Value Date/Time   NA 133 (L) 04/03/2019 0730   NA 143 05/18/2017 1218   NA 143 02/02/2017 1137   K 3.9 04/03/2019 0730   K 3.8 02/02/2017 1137   CL 94 (L) 04/03/2019 0730   CO2 25 04/03/2019 0730   CO2 24 02/02/2017 1137   GLUCOSE 146 (H) 04/03/2019 0730   GLUCOSE 74 02/02/2017 1137   BUN 41 (H) 04/03/2019 0730   BUN 76 (HH) 05/18/2017 1218   BUN 63.4 (H) 02/02/2017 1137   CREATININE 5.19 (H) 04/03/2019 0730   CREATININE 3.17 (H) 01/10/2019 1525   CREATININE 3.0 (HH) 02/02/2017 1137   CALCIUM 7.3 (L) 04/03/2019 0730   CALCIUM 9.9 02/02/2017 1137   GFRNONAA 8 (L) 04/03/2019 0730   GFRNONAA 14 (L) 01/10/2019 1525   GFRAA 9 (L) 04/03/2019 0730   GFRAA 16 (L) 01/10/2019 1525     INR    Component Value Date/Time   INR 1.1 03/28/2019 0329     Intake/Output Summary (Last 24 hours) at 04/04/2019 7026 Last data filed at 04/04/2019 0400 Gross per 24 hour  Intake 100 ml  Output 1750 ml  Net -1650 ml     Assessment/Plan:  73 y.o. female is s/p L CFA endarterectomy and femoral to BK pop bypass with vein 7 Days Post-Op   Perfusing L foot well with DP and PT by doppler Incisions healing well Ok for discharge from vascular standpoint; office will call pt to arrange 2-3 week follow up   Dagoberto Ligas, PA-C [facility] [phone] 04/04/2019 7:27 AM  Agree with above.  2+ popliteal pulse incisions healing, foot pink warm DP PT doppler Will follow at a distance Call if questions  Ruta Hinds, MD [facility] [place] Office: [phone]

## 2019-04-04 NOTE — Progress Notes (Signed)
Physical Therapy Treatment Patient Details Name: [name] MRN: [id number] DOB: 20-Jan-1946 Today's Date: 04/04/2019    History of Present Illness Pt is a 73 y.o. female admitted 03/23/19 from wound care center due to infected L foot wound that failed outpatient antibiotics. S/p L CFA endarterectomy and femoral to BK bypass on 12/21. Potential for surgical intervention of L foot pending vascular intervention status. PMH includes CAD, CHF/ICM, CKD IV, HTN, DM, PAD (bilateral SFA occlusion 07/2018), L toe osteomyelitis.    PT Comments    Patient seen for mobility progression. Pt is making progress toward PT goals and tolerated increased activity this session. Pt requires mod A for sit to stand transfers and min/mod A (+2 for chair follow) for short distance gait training. Continue to progress as tolerated.     Follow Up Recommendations  CIR;Supervision for mobility/OOB     Equipment Recommendations  (defer)    Recommendations for Other Services Rehab consult     Precautions / Restrictions Precautions Precautions: Fall Restrictions Weight Bearing Restrictions: No    Mobility  Bed Mobility Overal bed mobility: Needs Assistance Bed Mobility: Supine to Sit     Supine to sit: Supervision     General bed mobility comments: HOB elevated  Transfers Overall transfer level: Needs assistance Equipment used: Rolling walker (2 wheeled);1 person hand held assist Transfers: Sit to/from Stand Sit to Stand: From elevated surface;Mod assist         General transfer comment: assist to power up into standing; cues for safe hand placement  Ambulation/Gait Ambulation/Gait assistance: Min assist;+2 safety/equipment;Mod assist Gait Distance (Feet): 12 Feet Assistive device: Rolling walker (2 wheeled) Gait Pattern/deviations: Step-to pattern;Decreased weight shift to left;Antalgic;Trunk flexed;Decreased stance time - left;Decreased step length - right Gait velocity: Decreased    General Gait Details: assistance for balance; 2 LOB posteriorly with assist needed to recover; cues for upright posture and forward gaze as well as sequencing    Stairs             Wheelchair Mobility    Modified Rankin (Stroke Patients Only)       Balance Overall balance assessment: Needs assistance Sitting-balance support: Feet supported;No upper extremity supported Sitting balance-Leahy Scale: Fair     Standing balance support: Bilateral upper extremity supported;During functional activity Standing balance-Leahy Scale: Poor                              Cognition Arousal/Alertness: Awake/alert Behavior During Therapy: WFL for tasks assessed/performed Overall Cognitive Status: Within Functional Limits for tasks assessed                                 General Comments: tangential       Exercises      General Comments        Pertinent Vitals/Pain Pain Assessment: Faces Faces Pain Scale: Hurts little more Pain Location: L LE Pain Descriptors / Indicators: Guarding;Sore Pain Intervention(s): Limited activity within patient's tolerance;Monitored during session;Repositioned    Home Living                      Prior Function            PT Goals (current goals can now be found in the care plan section) Progress towards PT goals: Progressing toward goals    Frequency    Min 2X/week  PT Plan Current plan remains appropriate    Co-evaluation              AM-PAC PT "6 Clicks" Mobility   Outcome Measure  Help needed turning from your back to your side while in a flat bed without using bedrails?: A Lot Help needed moving from lying on your back to sitting on the side of a flat bed without using bedrails?: A Lot Help needed moving to and from a bed to a chair (including a wheelchair)?: A Lot Help needed standing up from a chair using your arms (e.g., wheelchair or bedside chair)?: A Lot Help needed to  walk in hospital room?: A Lot Help needed climbing 3-5 steps with a railing? : Total 6 Click Score: 11    End of Session Equipment Utilized During Treatment: Gait belt Activity Tolerance: Patient tolerated treatment well Patient left: with call bell/phone within reach;in chair;with chair alarm set Nurse Communication: Mobility status PT Visit Diagnosis: Other abnormalities of gait and mobility (R26.89);Muscle weakness (generalized) (M62.81);Pain Pain - Right/Left: Left Pain - part of body: Leg     Time: 0375-4360 PT Time Calculation (min) (ACUTE ONLY): 31 min  Charges:  $Gait Training: 23-37 mins                     Earney Navy, PTA Acute Rehabilitation Services Pager: [phone] Office: [phone]     Darliss Cheney 04/04/2019, 4:08 PM

## 2019-04-04 NOTE — Progress Notes (Signed)
Inpatient Rehabilitation-Admissions Coordinator   Please see my note from 12/23.   Noted there are now surgical plans in place for a left 5th ray amputation on Wednesday. Will follow up with pt after surgery for further assessment and possible admit to CIR at that time.   Raechel Ache, OTR/L  Rehab Admissions Coordinator  [phone] 04/04/2019 9:22 AM

## 2019-04-04 NOTE — Progress Notes (Addendum)
[facility] Progress Note   Subjective:  Seen in room. Completed dialysis yesterday with 1.7L removed. Trying to get comfortable in bed. Requesting pain meds before working with PT today   Objective Vitals:   04/03/19 2000 04/04/19 0258 04/04/19 0642 04/04/19 0916  BP: (!) 108/53  113/60 (!) 135/57  Pulse:   78 81  Resp: (!) 21 12 19  (!) 21  Temp: 98.8 F (37.1 C)  98 F (36.7 C)   TempSrc: Oral  Oral   SpO2: 99%  99% 98%  Weight:  68 kg    Height:        Physical Exam General: Elderly female, alert, nad  Heart: Regular Lungs: Clear bilaterally  Abdomen: soft non-tender  Extremities: Trace LE edema  Dialysis Access: RUE AVF +bruit    Weight change: -3.238 kg   Additional Objective Labs: Basic Metabolic Panel: Recent Labs  Lab 03/29/19 0508 03/31/19 0715 04/03/19 0730  NA 134* 134* 133*  K 4.9 4.0 3.9  CL 97* 96* 94*  CO2 21* 23 25  GLUCOSE 172* 104* 146*  BUN 51* 38* 41*  CREATININE 4.21* 4.37* 5.19*  CALCIUM 7.0* 7.3* 7.3*  PHOS  --  5.0* 4.7*   CBC: Recent Labs  Lab 03/29/19 0508 03/31/19 0715 04/03/19 0729  WBC 11.5* 10.9* 8.7  HGB 8.0* 7.3* 8.0*  HCT 24.7* 22.4* 25.4*  MCV 95.4 96.6 99.2  PLT 238 221 246   Blood Culture    Component Value Date/Time   SDES BLOOD BLOOD LEFT FOREARM 03/23/2019 1538   SPECREQUEST  03/23/2019 1538    BOTTLES DRAWN AEROBIC AND ANAEROBIC Blood Culture results may not be optimal due to an excessive volume of blood received in culture bottles   CULT  03/23/2019 1538    NO GROWTH 5 DAYS Performed at [facility], [address] [place], [place] [address]    REPTSTATUS 03/28/2019 FINAL 03/23/2019 1538     Medications: . sodium chloride    . ceFEPime (MAXIPIME) IV Stopped (04/03/19 2052)  . magnesium sulfate bolus IVPB     . allopurinol  100 mg Oral BID  . aspirin EC  81 mg Oral Daily  . atorvastatin  10 mg Oral q1800  . Chlorhexidine Gluconate Cloth  6 each Topical Q0600  .  cinacalcet  30 mg Oral Q supper  . darbepoetin (ARANESP) injection - DIALYSIS  60 mcg Intravenous Q Tue-HD  . doxercalciferol  4 mcg Intravenous Q T,Th,Sa-HD  . feeding supplement (NEPRO CARB STEADY)  237 mL Oral BID BM  . hydrALAZINE  25 mg Oral Q8H  . insulin aspart  0-15 Units Subcutaneous TID WC  . insulin aspart  0-5 Units Subcutaneous QHS  . insulin detemir  10 Units Subcutaneous Daily  . isosorbide dinitrate  10 mg Oral TID  . multivitamin  1 tablet Oral QHS  . pantoprazole  40 mg Oral Daily  . polyethylene glycol  17 g Oral Daily  . sevelamer carbonate  1,600 mg Oral TID WC  . traZODone  25-100 mg Oral QHS    Dialysis Orders:  [place], TTS, EDW 72 kg, BFR 350, DFR 800, Optiflux 180, 3K/2.25 calcium, vancomycin 750 mg q. HD, Mircera 50 mcg every 2 weeks, Hectorol 4 mcg IV 3 times weekly, heparin 5000 unit bolus. Right radiocephalic fistula.  Assessment/Plan: 1.  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   Antibiotics narrowed to IV cefepime. Plans for left 5th  ray amp on Wed.  2. ESRD - HD TTS. Next HD 12/29. Added K bath.  3. HTN/volume-  BP controlled. No gross volume overload on exam. Below EDW. Post HD wt 69.7kg. Titrate down as tolerated.  4. Anemia- Hgb low post-op. Aranesp 60 q Tues. Check Fe studies.  5. Metabolic Bone Disease-  Corr Ca/Phos ok. Continue Renvela binder/VDRA.  6. Nutrition - Add prot supp for low albumin  Dispo:  To continue ongoing PT/OT either following admission to CIR or SNF.  Lynnda Child PA-C [facility] Pager [phone] 04/04/2019,11:38 AM  LOS: 12 days    Pt seen, examined and agree w A/P as above.  [name]  MD 04/04/2019, 9:02 PM

## 2019-04-04 NOTE — Progress Notes (Signed)
Pharmacy Antibiotic Note  [name] is a 73 y.o. female admitted on 03/23/2019 with foot wound with imaging showing septic arthritis with osteomyelitis, surrounding inflammatory phlegmon and abscess, diffuse cellulitis and myofascitis. Pharmacy was consulted to dose cefepime. Plans noted to d/c to SNF with antibiotics when bed available -WBC- 8.7, afebrile   Plan: Continue Cefepime 2gm IV q24h At discharge, change to cefepime 2g with HD sessions  Monitor HD plans and clinical status  Height: 5\' 4"  (162.6 cm) Weight: 149 lb 14.6 oz (68 kg) IBW/kg (Calculated) : 54.7  Temp (24hrs), Avg:98.3 F (36.8 C), Min:98 F (36.7 C), Max:98.8 F (37.1 C)  Recent Labs  Lab 03/29/19 0508 03/29/19 1454 03/31/19 0715 04/03/19 0729 04/03/19 0730  WBC 11.5*  --  10.9* 8.7  --   CREATININE 4.21*  --  4.37*  --  5.19*  VANCORANDOM  --  38  --   --   --     Estimated Creatinine Clearance: 9.1 mL/min (A) (by C-G formula based on SCr of 5.19 mg/dL (H)).    Antibiotics this admission Cefe 12/25 >>  Vanc 12/10 (PTA) >> 12/25 Zosyn 12/16 >>12/25  Vancomycin dosing/levels and HD sessions: 12/17 pre HD VR = 33  12/18 HD 3.5hr, vancomycin 750mg  12/19 pre-HD VR 26 > HD 3 hr 12/20 vancomycin 500mg   12/22 HD 4hr; VR 38- drawn after vancomycin dose    Microbiology:  12/16 COVID neg 12/16 MRSA neg 12/16 Blood x 2: negative Outpt wound cx + serratia   Hildred Laser, PharmD Clinical Pharmacist **Pharmacist phone directory can now be found on [email or web address] ([email or web address]).  Listed under [place].

## 2019-04-04 NOTE — Progress Notes (Addendum)
Spoke with patient today. MRI left foot shows osteo and as she is still in house will discuss with Dr. Sharol Given if he wishes to go forward with Left 5th ray amputation on Wed.   Spoke with Dr. Sharol Given by phone. Will plan for Left 5th ray amputation on Wed

## 2019-04-04 NOTE — Progress Notes (Signed)
PROGRESS NOTE    [name]   FBP:[id number]  DOB: Dec 20, 1945  DOA: 03/23/2019 PCP: Unk Pinto, MD   Brief Narrative:  [name] 73 year old married female, independent, PMH of CAD s/p CABG, chronic combined CHF/ICM, stage IV CKD (baseline creatinine 2.7-3.1), HTN, HLD, DM, lower extremity PAD (bilateral SFA occlusion 07/2018), chronic bilateral feet ulcerations, osteomyelitis of left toe, LBBB who has had a chronic wound on her left foot that is followed at the wound care center closely, recently noted worsening infection and started on IV antibiotics including vancomycin at dialysis (reportedly completed 3 doses) sent to [facility] from the wound care center due to low-grade fevers and increased drainage from the ulcer.  Admitted for left little toe osteomyelitis, abscess and sepsis complicating PAD.  Nephrology consulted for HD needs.  S/p aortogram with bilateral lower extremity arteriogram with runoff 12/18 followed by left CFA endarterectomy and Dacron patch angioplasty, left femoral to below-knee popliteal bypass graft on 12/21.  Orthopedics/Dr. Sharol Given plans surgical intervention after vascular procedure.    Subjective: She has no complaints.   Assessment & Plan:   Principal Problem:   Osteomyelitis-septic arthritis left fifth metatarsal joint, abscess and ulcer in setting of Peripheral artery disease -Patient has failed outpatient antibiotic treatment and has been started on vancomycin and Zosyn in the hospital -Outpatient wound culture grew out Serratia-blood cultures have been negative- transitioned to Cefepime on 12/25 -5/15-MRI left foot: 1. Septic arthritis at the fifth MTP joint with osteomyelitis involving the distal half of the fifth metatarsal and also the proximal phalanx. 2. Surrounding inflammatory phlegmon and abscess. 3. Diffuse cellulitis and myofasciitis. -12/18-lower extremity arteriogram revealed 70% stenosis of the left common femoral  artery with heavily calcified plaque, SFA occluded throughout proximal mid and distal segment-50% right common femoral artery stenosis and right SFA occlusion throughout the entire segment- - 12/21-patient underwent left CFA endarterectomy and Dacron patch angioplasty and left below the knee popliteal bypass graft -Orthopedic surgery team does not plan on doing any further procedures in the hospital and will follow up on the patient in about a week in the office - plan to d/c to SNF with antibiotics with dialysis- awaiting SNF bed  Active Problems: Ulcers on right leg and foot -These are suspected to be ischemic ulcers -The patient states that these have been present for many months and apparently are healing slowly -She follows as outpatient with the wound care center    Essential hypertension -Continue current medications which include hydralazine and isosorbide dinitrate    Diabetes mellitus type 2, insulin dependent  -Last A1c was 6.3-at home she takes 70/30 insulin Continue Levemir and sliding scale insulin in the hospital    ESRD on hemodialysis  -Receives dialysis on Tuesday Thursday Saturday and received at this morning  Chronic systolic heart failure -Fluid management is with dialysis  Gout -Continue allopurinol  Anemia of chronic disease -Stable   Time spent in minutes: 35 DVT prophylaxis: SCDs Code Status: Full code Family Communication: None at bedside Disposition Plan: -continue to treat infection- awaiting SNF Consultants:   Vascular surgery  Orthopedic surgery  Nephrology Procedures:   Dr. Carlis Abbott, VVS S/p aortogram with bilateral lower extremity arteriogram with runoff 12/18.   Dr. Donnetta Hutching, VVS S/P left CFA endarterectomy and Dacron patch angioplasty, left femoral to below-knee popliteal bypass graft on 12/21.  HD Antimicrobials:  Anti-infectives (From admission, onward)   Start     Dose/Rate Route Frequency Ordered Stop   04/01/19 2200  ceFEPIme  (  MAXIPIME) 1 g in sodium chloride 0.9 % 100 mL IVPB     1 g 200 mL/hr over 30 Minutes Intravenous Every 24 hours 04/01/19 1516     03/31/19 0000  Vancomycin (VANCOCIN) 750-5 MG/150ML-% SOLN     750 mg Intravenous Every T-Th-Sa (Hemodialysis) 03/30/19 1211     03/30/19 0000  piperacillin-tazobactam (ZOSYN) 2-0.25 GM/50ML IVPB     2.25 g Intravenous Every 8 hours 03/30/19 1211     03/29/19 1200  vancomycin (VANCOCIN) IVPB 750 mg/150 ml premix  Status:  Discontinued     750 mg 150 mL/hr over 60 Minutes Intravenous Every T-Th-Sa (Hemodialysis) 03/26/19 2303 04/01/19 1507   03/28/19 0600  cefUROXime (ZINACEF) 1.5 g in sodium chloride 0.9 % 100 mL IVPB     1.5 g 200 mL/hr over 30 Minutes Intravenous On call to O.R. 03/27/19 1132 03/28/19 1453   03/26/19 2359  vancomycin (VANCOREADY) IVPB 500 mg/100 mL     500 mg 100 mL/hr over 60 Minutes Intravenous  Once 03/26/19 2303 03/27/19 0716   03/25/19 1730  vancomycin (VANCOCIN) IVPB 750 mg/150 ml premix     750 mg 150 mL/hr over 60 Minutes Intravenous  Once 03/25/19 1513 03/25/19 1627   03/24/19 1800  piperacillin-tazobactam (ZOSYN) IVPB 2.25 g     2.25 g 100 mL/hr over 30 Minutes Intravenous Every 8 hours 03/24/19 1631 04/01/19 1700   03/24/19 0000  vancomycin variable dose per unstable renal function (pharmacist dosing)  Status:  Discontinued      Does not apply See admin instructions 03/23/19 1528 03/29/19 0720   03/23/19 1530  vancomycin (VANCOREADY) IVPB 1500 mg/300 mL     1,500 mg 150 mL/hr over 120 Minutes Intravenous  Once 03/23/19 1524 03/23/19 1949   03/23/19 1515  piperacillin-tazobactam (ZOSYN) IVPB 3.375 g     3.375 g 100 mL/hr over 30 Minutes Intravenous  Once 03/23/19 1504 03/23/19 1556       Objective: Vitals:   04/03/19 1530 04/03/19 2000 04/04/19 0258 04/04/19 0642  BP: (!) 126/55 (!) 108/53  113/60  Pulse:    78  Resp:  (!) 21 12 19   Temp:  98.8 F (37.1 C)  98 F (36.7 C)  TempSrc:  Oral  Oral  SpO2:  99%  99%   Weight:   68 kg   Height:        Intake/Output Summary (Last 24 hours) at 04/04/2019 1048 Last data filed at 04/04/2019 0400 Gross per 24 hour  Intake 100 ml  Output -  Net 100 ml   Filed Weights   04/03/19 0642 04/03/19 1017 04/04/19 0258  Weight: 72.1 kg 69.7 kg 68 kg    Examination: General exam: Appears comfortable  HEENT: PERRLA, oral mucosa moist, no sclera icterus or thrush Respiratory system: Clear to auscultation. Respiratory effort normal. Cardiovascular system: S1 & S2 heard,  No murmurs  Gastrointestinal system: Abdomen soft, non-tender, nondistended. Normal bowel sounds   Central nervous system: Alert and oriented. No focal neurological deficits. Extremities: No cyanosis, clubbing or edema Skin: see below Psychiatry:  Mood & affect appropriate.      Data Reviewed: I have personally reviewed following labs and imaging studies  CBC: Recent Labs  Lab 03/29/19 0508 03/31/19 0715 04/03/19 0729  WBC 11.5* 10.9* 8.7  HGB 8.0* 7.3* 8.0*  HCT 24.7* 22.4* 25.4*  MCV 95.4 96.6 99.2  PLT 238 221 166   Basic Metabolic Panel: Recent Labs  Lab 03/29/19 0508 03/31/19 0715 04/03/19 0730  NA  134* 134* 133*  K 4.9 4.0 3.9  CL 97* 96* 94*  CO2 21* 23 25  GLUCOSE 172* 104* 146*  BUN 51* 38* 41*  CREATININE 4.21* 4.37* 5.19*  CALCIUM 7.0* 7.3* 7.3*  PHOS  --  5.0* 4.7*   GFR: Estimated Creatinine Clearance: 9.1 mL/min (A) (by C-G formula based on SCr of 5.19 mg/dL (H)). Liver Function Tests: Recent Labs  Lab 03/31/19 0715 04/03/19 0730  ALBUMIN 2.3* 2.4*   No results for input(s): LIPASE, AMYLASE in the last 168 hours. No results for input(s): AMMONIA in the last 168 hours. Coagulation Profile: No results for input(s): INR, PROTIME in the last 168 hours. Cardiac Enzymes: No results for input(s): CKTOTAL, CKMB, CKMBINDEX, TROPONINI in the last 168 hours. BNP (last 3 results) No results for input(s): PROBNP in the last 8760 hours. HbA1C: No results  for input(s): HGBA1C in the last 72 hours. CBG: Recent Labs  Lab 04/03/19 1111 04/03/19 1612 04/03/19 2122 04/03/19 2342 04/04/19 0641  GLUCAP 101* 148* 81 85 117*   Lipid Profile: No results for input(s): CHOL, HDL, LDLCALC, TRIG, CHOLHDL, LDLDIRECT in the last 72 hours. Thyroid Function Tests: No results for input(s): TSH, T4TOTAL, FREET4, T3FREE, THYROIDAB in the last 72 hours. Anemia Panel: No results for input(s): VITAMINB12, FOLATE, FERRITIN, TIBC, IRON, RETICCTPCT in the last 72 hours. Urine analysis:    Component Value Date/Time   COLORURINE YELLOW 01/10/2019 [place] 01/10/2019 1525   LABSPEC 1.019 01/10/2019 1525   PHURINE 7.5 01/10/2019 1525   GLUCOSEU TRACE (A) 01/10/2019 1525   HGBUR NEGATIVE 01/10/2019 1525   BILIRUBINUR NEGATIVE 08/25/2018 0045   KETONESUR NEGATIVE 01/10/2019 1525   PROTEINUR 3+ (A) 01/10/2019 1525   UROBILINOGEN 0.2 05/30/2009 1500   NITRITE NEGATIVE 01/10/2019 1525   LEUKOCYTESUR NEGATIVE 01/10/2019 1525   Sepsis Labs: @LABRCNTIP (procalcitonin:4,lacticidven:4) ) No results found for this or any previous visit (from the past 240 hour(s)).       Radiology Studies: No results found.    Scheduled Meds: . allopurinol  100 mg Oral BID  . aspirin EC  81 mg Oral Daily  . atorvastatin  10 mg Oral q1800  . Chlorhexidine Gluconate Cloth  6 each Topical Q0600  . cinacalcet  30 mg Oral Q supper  . darbepoetin (ARANESP) injection - DIALYSIS  60 mcg Intravenous Q Tue-HD  . doxercalciferol  4 mcg Intravenous Q T,Th,Sa-HD  . feeding supplement (NEPRO CARB STEADY)  237 mL Oral BID BM  . hydrALAZINE  25 mg Oral Q8H  . insulin aspart  0-15 Units Subcutaneous TID WC  . insulin aspart  0-5 Units Subcutaneous QHS  . insulin detemir  10 Units Subcutaneous Daily  . isosorbide dinitrate  10 mg Oral TID  . multivitamin  1 tablet Oral QHS  . pantoprazole  40 mg Oral Daily  . polyethylene glycol  17 g Oral Daily  . sevelamer  carbonate  1,600 mg Oral TID WC  . traZODone  25-100 mg Oral QHS   Continuous Infusions: . sodium chloride    . ceFEPime (MAXIPIME) IV Stopped (04/03/19 2052)  . magnesium sulfate bolus IVPB       LOS: 12 days      Debbe Odea, MD [facility] Pager: [email or web address] Password [email or web address] 04/04/2019, 10:48 AM

## 2019-04-05 LAB — RENAL FUNCTION PANEL
Albumin: 2.3 g/dL — ABNORMAL LOW (ref 3.5–5.0)
Anion gap: 17 — ABNORMAL HIGH (ref 5–15)
BUN: 45 mg/dL — ABNORMAL HIGH (ref 8–23)
CO2: 21 mmol/L — ABNORMAL LOW (ref 22–32)
Calcium: 7.5 mg/dL — ABNORMAL LOW (ref 8.9–10.3)
Chloride: 96 mmol/L — ABNORMAL LOW (ref 98–111)
Creatinine, Ser: 5.58 mg/dL — ABNORMAL HIGH (ref 0.44–1.00)
GFR calc Af Amer: 8 mL/min — ABNORMAL LOW (ref >60)
GFR calc non Af Amer: 7 mL/min — ABNORMAL LOW (ref >60)
Glucose, Bld: 100 mg/dL — ABNORMAL HIGH (ref 70–99)
Phosphorus: 4.5 mg/dL (ref 2.5–4.6)
Potassium: 4.2 mmol/L (ref 3.5–5.1)
Sodium: 134 mmol/L — ABNORMAL LOW (ref 135–145)

## 2019-04-05 LAB — IRON AND TIBC
Iron: 45 ug/dL (ref 28–170)
Saturation Ratios: 19 % (ref 10.4–31.8)
TIBC: 237 ug/dL — ABNORMAL LOW (ref 250–450)
UIBC: 192 ug/dL

## 2019-04-05 LAB — CBC
HCT: 25.5 % — ABNORMAL LOW (ref 36.0–46.0)
Hemoglobin: 8 g/dL — ABNORMAL LOW (ref 12.0–15.0)
MCH: 31.3 pg (ref 26.0–34.0)
MCHC: 31.4 g/dL (ref 30.0–36.0)
MCV: 99.6 fL (ref 80.0–100.0)
Platelets: 221 10*3/uL (ref 150–400)
RBC: 2.56 MIL/uL — ABNORMAL LOW (ref 3.87–5.11)
RDW: 18 % — ABNORMAL HIGH (ref 11.5–15.5)
WBC: 7.1 10*3/uL (ref 4.0–10.5)
nRBC: 0 % (ref 0.0–0.2)

## 2019-04-05 LAB — GLUCOSE, CAPILLARY
Glucose-Capillary: 87 mg/dL (ref 70–99)
Glucose-Capillary: 87 mg/dL (ref 70–99)
Glucose-Capillary: 189 mg/dL — ABNORMAL HIGH (ref 70–99)

## 2019-04-05 LAB — FERRITIN: Ferritin: 375 ng/mL — ABNORMAL HIGH (ref 11–307)

## 2019-04-05 MED ORDER — CEFAZOLIN SODIUM-DEXTROSE 2-4 GM/100ML-% IV SOLN
2.0000 g | INTRAVENOUS | Status: AC
  Administered 2019-04-06: 13:00:00 2 g via INTRAVENOUS
  Filled 2019-04-05 (×2): qty 100

## 2019-04-05 MED ORDER — LIDOCAINE HCL (PF) 1 % IJ SOLN: 5.0000 mL | INTRAMUSCULAR | Status: DC | PRN

## 2019-04-05 MED ORDER — HEPARIN SODIUM (PORCINE) 1000 UNIT/ML DIALYSIS
20.0000 [IU]/kg | INTRAMUSCULAR | Status: DC | PRN
  Administered 2019-04-05: 1400 [IU] via INTRAVENOUS_CENTRAL

## 2019-04-05 MED ORDER — DARBEPOETIN ALFA 60 MCG/0.3ML IJ SOSY
PREFILLED_SYRINGE | INTRAMUSCULAR | Status: AC
  Administered 2019-04-05: 60 ug via INTRAVENOUS
  Filled 2019-04-05: qty 0.3

## 2019-04-05 MED ORDER — HEPARIN SODIUM (PORCINE) 1000 UNIT/ML DIALYSIS: 1000.0000 [IU] | INTRAMUSCULAR | Status: DC | PRN

## 2019-04-05 MED ORDER — SODIUM CHLORIDE 0.9 % IV SOLN: 100.0000 mL | INTRAVENOUS | Status: DC | PRN

## 2019-04-05 MED ORDER — LIDOCAINE-PRILOCAINE 2.5-2.5 % EX CREA: 1.0000 "application " | TOPICAL_CREAM | CUTANEOUS | Status: DC | PRN

## 2019-04-05 MED ORDER — ALTEPLASE 2 MG IJ SOLR: 2.0000 mg | Freq: Once | INTRAMUSCULAR | Status: DC | PRN

## 2019-04-05 MED ORDER — DOXERCALCIFEROL 4 MCG/2ML IV SOLN
INTRAVENOUS | Status: AC
  Administered 2019-04-05: 4 ug via INTRAVENOUS
  Filled 2019-04-05: qty 2

## 2019-04-05 MED ORDER — HEPARIN SODIUM (PORCINE) 1000 UNIT/ML IJ SOLN
INTRAMUSCULAR | Status: AC
  Filled 2019-04-05: qty 2

## 2019-04-05 MED ORDER — CHLORHEXIDINE GLUCONATE 4 % EX LIQD
60.0000 mL | Freq: Once | CUTANEOUS | Status: AC
  Administered 2019-04-06: 4 via TOPICAL
  Filled 2019-04-05: qty 60

## 2019-04-05 MED ORDER — PENTAFLUOROPROP-TETRAFLUOROETH EX AERO: 1.0000 "application " | INHALATION_SPRAY | CUTANEOUS | Status: DC | PRN

## 2019-04-05 NOTE — H&P (View-Only) (Signed)
Patient ID: [name], female   DOB: October 21, 1945, 73 y.o.   MRN: [id number] Patient is seen in follow-up status post revascularization left lower extremity.  The fifth metatarsal head periwound vascularity is improved significantly.  There is healthy granulation tissue there is exposed bone patient does have osteomyelitis of the fifth metatarsal head.  Discussed with patient recommendation to proceed with a left foot fifth ray amputation and then anticipate patient could proceed with inpatient rehab.  Patient states she understands wished to proceed with surgery at this time.  Plan for surgery on Wednesday.

## 2019-04-05 NOTE — Progress Notes (Signed)
Patient requesting a suppository to have a BM, Dr. Wynelle Cleveland made aware through [facility] system. Will monitor patient. Saoirse Legere, Bettina Gavia rN

## 2019-04-05 NOTE — Progress Notes (Signed)
PROGRESS NOTE    [name]   WNU:[id number]  DOB: [birth year]  DOA: 03/23/2019 PCP: Unk Pinto, MD   Brief Narrative:  [name] 73 year old married female, independent, PMH of CAD s/p CABG, chronic combined CHF/ICM, stage IV CKD (baseline creatinine 2.7-3.1), HTN, HLD, DM, lower extremity PAD (bilateral SFA occlusion 07/2018), chronic bilateral feet ulcerations, osteomyelitis of left toe, LBBB who has had a chronic wound on her left foot that is followed at the wound care center closely, recently noted worsening infection and started on IV antibiotics including vancomycin at dialysis (reportedly completed 3 doses) sent to [facility] from the wound care center due to low-grade fevers and increased drainage from the ulcer.  Admitted for left little toe osteomyelitis, abscess and sepsis complicating PAD.  Nephrology consulted for HD needs.  S/p aortogram with bilateral lower extremity arteriogram with runoff 12/18 followed by left CFA endarterectomy and Dacron patch angioplasty, left femoral to below-knee popliteal bypass graft on 12/21.  Orthopedics/Dr. Sharol Given plans surgical intervention after vascular procedure.    Subjective: No complaints.   Assessment & Plan:   Principal Problem:   Osteomyelitis-septic arthritis left fifth metatarsal joint, abscess and ulcer in setting of Peripheral artery disease -Patient has failed outpatient antibiotic treatment and has been started on vancomycin and Zosyn in the hospital -Outpatient wound culture grew out Serratia-blood cultures have been negative- transitioned to Cefepime on 12/25 -5/15-MRI left foot: 1. Septic arthritis at the fifth MTP joint with osteomyelitis involving the distal half of the fifth metatarsal and also the proximal phalanx. 2. Surrounding inflammatory phlegmon and abscess. 3. Diffuse cellulitis and myofasciitis. -12/18-lower extremity arteriogram revealed 70% stenosis of the left common femoral artery  with heavily calcified plaque, SFA occluded throughout proximal mid and distal segment-50% right common femoral artery stenosis and right SFA occlusion throughout the entire segment- - 12/21-patient underwent left CFA endarterectomy and Dacron patch angioplasty and left below the knee popliteal bypass graft -Orthopedic surgery team does not plan on doing any further procedures in the hospital and will follow up on the patient in about a week in the office - plan to d/c to SNF with antibiotics with dialysis- she has been awaiting a SNF bed but she has not received one - Dr Sharol Given plans left foot 5th ray amputation on Wednesday  Active Problems: Ulcers on right leg and foot -These are suspected to be ischemic ulcers -The patient states that these have been present for many months and apparently are healing slowly -She follows as outpatient with the wound care center    Essential hypertension -Continue current medications which include hydralazine and isosorbide dinitrate    Diabetes mellitus type 2, insulin dependent  -Last A1c was 6.3-at home she takes 70/30 insulin Continue Levemir and sliding scale insulin in the hospital    ESRD on hemodialysis  -Receives dialysis on Tuesday Thursday Saturday and received at this morning  Chronic systolic heart failure -Fluid management is with dialysis  Gout -Continue allopurinol  Anemia of chronic disease -Stable   Time spent in minutes: 35 DVT prophylaxis: SCDs Code Status: Full code Family Communication: None at bedside Disposition Plan: -continue to treat infection-   Consultants:   Vascular surgery  Orthopedic surgery  Nephrology Procedures:   Dr. Carlis Abbott, VVS S/p aortogram with bilateral lower extremity arteriogram with runoff 12/18.   Dr. Donnetta Hutching, VVS S/P left CFA endarterectomy and Dacron patch angioplasty, left femoral to below-knee popliteal bypass graft on 12/21.  HD Antimicrobials:  Anti-infectives (From admission,  onward)    Start     Dose/Rate Route Frequency Ordered Stop   04/01/19 2200  ceFEPIme (MAXIPIME) 1 g in sodium chloride 0.9 % 100 mL IVPB     1 g 200 mL/hr over 30 Minutes Intravenous Every 24 hours 04/01/19 1516     03/31/19 0000  Vancomycin (VANCOCIN) 750-5 MG/150ML-% SOLN     750 mg Intravenous Every T-Th-Sa (Hemodialysis) 03/30/19 1211     03/30/19 0000  piperacillin-tazobactam (ZOSYN) 2-0.25 GM/50ML IVPB     2.25 g Intravenous Every 8 hours 03/30/19 1211     03/29/19 1200  vancomycin (VANCOCIN) IVPB 750 mg/150 ml premix  Status:  Discontinued     750 mg 150 mL/hr over 60 Minutes Intravenous Every T-Th-Sa (Hemodialysis) 03/26/19 2303 04/01/19 1507   03/28/19 0600  cefUROXime (ZINACEF) 1.5 g in sodium chloride 0.9 % 100 mL IVPB     1.5 g 200 mL/hr over 30 Minutes Intravenous On call to O.R. 03/27/19 1132 03/28/19 1453   03/26/19 2359  vancomycin (VANCOREADY) IVPB 500 mg/100 mL     500 mg 100 mL/hr over 60 Minutes Intravenous  Once 03/26/19 2303 03/27/19 0716   03/25/19 1730  vancomycin (VANCOCIN) IVPB 750 mg/150 ml premix     750 mg 150 mL/hr over 60 Minutes Intravenous  Once 03/25/19 1513 03/25/19 1627   03/24/19 1800  piperacillin-tazobactam (ZOSYN) IVPB 2.25 g     2.25 g 100 mL/hr over 30 Minutes Intravenous Every 8 hours 03/24/19 1631 04/01/19 1700   03/24/19 0000  vancomycin variable dose per unstable renal function (pharmacist dosing)  Status:  Discontinued      Does not apply See admin instructions 03/23/19 1528 03/29/19 0720   03/23/19 1530  vancomycin (VANCOREADY) IVPB 1500 mg/300 mL     1,500 mg 150 mL/hr over 120 Minutes Intravenous  Once 03/23/19 1524 03/23/19 1949   03/23/19 1515  piperacillin-tazobactam (ZOSYN) IVPB 3.375 g     3.375 g 100 mL/hr over 30 Minutes Intravenous  Once 03/23/19 1504 03/23/19 1556       Objective: Vitals:   04/05/19 1030 04/05/19 1100 04/05/19 1120 04/05/19 1250  BP: 126/60 128/65 (!) 141/70 (!) 134/59  Pulse: 76 74 77 81  Resp:   19 17    Temp:   97.9 F (36.6 C) 98.3 F (36.8 C)  TempSrc:   Oral Oral  SpO2:   98% 100%  Weight:   70.7 kg   Height:        Intake/Output Summary (Last 24 hours) at 04/05/2019 1751 Last data filed at 04/05/2019 1430 Gross per 24 hour  Intake 240 ml  Output 1505 ml  Net -1265 ml   Filed Weights   04/05/19 0345 04/05/19 0745 04/05/19 1120  Weight: 69.2 kg 70.3 kg 70.7 kg    Examination: General exam: Appears comfortable  HEENT: PERRLA, oral mucosa moist, no sclera icterus or thrush Respiratory system: Clear to auscultation. Respiratory effort normal. Cardiovascular system: S1 & S2 heard,  No murmurs  Gastrointestinal system: Abdomen soft, non-tender, nondistended. Normal bowel sounds   Central nervous system: Alert and oriented. No focal neurological deficits. Extremities: No cyanosis, clubbing or edema Skin: No rashes or ulcers Psychiatry:  Mood & affect appropriate.      Data Reviewed: I have personally reviewed following labs and imaging studies  CBC: Recent Labs  Lab 03/31/19 0715 04/03/19 0729 04/05/19 0724  WBC 10.9* 8.7 7.1  HGB 7.3* 8.0* 8.0*  HCT 22.4* 25.4* 25.5*  MCV 96.6 99.2  99.6  PLT 221 246 716   Basic Metabolic Panel: Recent Labs  Lab 03/31/19 0715 04/03/19 0730 04/05/19 0724  NA 134* 133* 134*  K 4.0 3.9 4.2  CL 96* 94* 96*  CO2 23 25 21*  GLUCOSE 104* 146* 100*  BUN 38* 41* 45*  CREATININE 4.37* 5.19* 5.58*  CALCIUM 7.3* 7.3* 7.5*  PHOS 5.0* 4.7* 4.5   GFR: Estimated Creatinine Clearance: 8.7 mL/min (A) (by C-G formula based on SCr of 5.58 mg/dL (H)). Liver Function Tests: Recent Labs  Lab 03/31/19 0715 04/03/19 0730 04/05/19 0724  ALBUMIN 2.3* 2.4* 2.3*   No results for input(s): LIPASE, AMYLASE in the last 168 hours. No results for input(s): AMMONIA in the last 168 hours. Coagulation Profile: No results for input(s): INR, PROTIME in the last 168 hours. Cardiac Enzymes: No results for input(s): CKTOTAL, CKMB, CKMBINDEX,  TROPONINI in the last 168 hours. BNP (last 3 results) No results for input(s): PROBNP in the last 8760 hours. HbA1C: No results for input(s): HGBA1C in the last 72 hours. CBG: Recent Labs  Lab 04/04/19 1150 04/04/19 1539 04/04/19 2107 04/05/19 0613 04/05/19 1249  GLUCAP 159* 141* 97 87 87   Lipid Profile: No results for input(s): CHOL, HDL, LDLCALC, TRIG, CHOLHDL, LDLDIRECT in the last 72 hours. Thyroid Function Tests: No results for input(s): TSH, T4TOTAL, FREET4, T3FREE, THYROIDAB in the last 72 hours. Anemia Panel: Recent Labs    04/05/19 0310  FERRITIN 375*  TIBC 237*  IRON 45   Urine analysis:    Component Value Date/Time   COLORURINE YELLOW 01/10/2019 1525   APPEARANCEUR CLEAR 01/10/2019 1525   LABSPEC 1.019 01/10/2019 1525   PHURINE 7.5 01/10/2019 1525   GLUCOSEU TRACE (A) 01/10/2019 1525   HGBUR NEGATIVE 01/10/2019 1525   BILIRUBINUR NEGATIVE 08/25/2018 0045   KETONESUR NEGATIVE 01/10/2019 1525   PROTEINUR 3+ (A) 01/10/2019 1525   UROBILINOGEN 0.2 05/30/2009 1500   NITRITE NEGATIVE 01/10/2019 1525   LEUKOCYTESUR NEGATIVE 01/10/2019 1525   Sepsis Labs: @LABRCNTIP (procalcitonin:4,lacticidven:4) ) No results found for this or any previous visit (from the past 240 hour(s)).       Radiology Studies: No results found.    Scheduled Meds: . allopurinol  100 mg Oral BID  . aspirin EC  81 mg Oral Daily  . atorvastatin  10 mg Oral q1800  . Chlorhexidine Gluconate Cloth  6 each Topical Q0600  . cinacalcet  30 mg Oral Q supper  . darbepoetin (ARANESP) injection - DIALYSIS  60 mcg Intravenous Q Tue-HD  . doxercalciferol  4 mcg Intravenous Q T,Th,Sa-HD  . feeding supplement (NEPRO CARB STEADY)  237 mL Oral BID BM  . feeding supplement (PRO-STAT SUGAR FREE 64)  30 mL Oral BID  . hydrALAZINE  25 mg Oral Q8H  . insulin aspart  0-15 Units Subcutaneous TID WC  . insulin aspart  0-5 Units Subcutaneous QHS  . insulin detemir  10 Units Subcutaneous Daily  .  isosorbide dinitrate  10 mg Oral TID  . multivitamin  1 tablet Oral QHS  . pantoprazole  40 mg Oral Daily  . polyethylene glycol  17 g Oral Daily  . sevelamer carbonate  1,600 mg Oral TID WC  . traZODone  25-100 mg Oral QHS   Continuous Infusions: . sodium chloride    . ceFEPime (MAXIPIME) IV 1 g (04/04/19 2122)  . magnesium sulfate bolus IVPB       LOS: 13 days      Debbe Odea, MD [facility] Pager:  [email or web address] Password [email or web address] 04/05/2019, 5:51 PM

## 2019-04-05 NOTE — Progress Notes (Signed)
Patient ID: [name], female   DOB: 12-19-1945, 73 y.o.   MRN: [id number] Patient is seen in follow-up status post revascularization left lower extremity.  The fifth metatarsal head periwound vascularity is improved significantly.  There is healthy granulation tissue there is exposed bone patient does have osteomyelitis of the fifth metatarsal head.  Discussed with patient recommendation to proceed with a left foot fifth ray amputation and then anticipate patient could proceed with inpatient rehab.  Patient states she understands wished to proceed with surgery at this time.  Plan for surgery on Wednesday.

## 2019-04-05 NOTE — PMR Pre-admission (Signed)
PMR Admission Coordinator Pre-Admission Assessment  Patient: [name] is an 73 y.o., female MRN: [id number] DOB: 24-Jul-1945 Height: '5\' 4"'  (162.6 cm) Weight: 75.4 kg(pt refused to let me take her massager out and weigh her.)  Insurance Information HMO:     PPO:      PCP:      IPA:      80/20: yes     OTHER:  PRIMARY: Medicare part A and B      Policy#: [id number]      Subscriber: Patient CM Name:       Phone#:      Fax#:  Pre-Cert#:       Employer:  Benefits:  Phone #: online     Name: verified online via [place] on 04/07/2019 Eff. Date: Part A and B effective 12/07/2010     Deduct: $1,408      Out of Pocket Max: NA      Life Max: NA CIR: Covered per Medicare guidelines once yearly deductible has been met.      SNF: days 1-20, 100%, days 21-100, 80% Outpatient: 80%     Co-Pay: 20% Home Health: 100%      Co-Pay:  DME: 80%     Co-Pay: 20% Providers: pt's choice.   *2021 yearly deductible expected to be $1,484.   SECONDARY: BCBS      Policy#: [id number]      Subscriber: Patient CM Name:       Phone#:      Fax#:  Pre-Cert#:       Employer:  Benefits:  Phone #: [phone]     Name:  Eff. Date:      Deduct:       Out of Pocket Max:       Life Max:  CIR:       SNF:  Outpatient:      Co-Pay:  Home Health:       Co-Pay:  DME:      Co-Pay:   Medicaid Application Date:       Case Manager:  Disability Application Date:       Case Worker:   The "Data Collection Information Summary" for patients in Inpatient Rehabilitation Facilities with attached "Privacy Act [place] Records" was provided and verbally reviewed with: Patient  Emergency Contact Information Contact Information    Name Relation Home Work Mobile   [place] Spouse [phone]  [phone]   Shanira, Tine [phone]  [phone]      Current Medical History  Patient Admitting Diagnosis: s/p L CFA endarterectomy and femoral to below-knee popliteal bypass AND left 5th ray amputation.    History of Present Illness: [name]. Lamora is a 73 year old female with history of CAD with CABG, chronic combined congestive heart failure/ICM, end-stage renal disease with hemodialysis,, hypertension, peripheral vascular disease as well as remote history of tobacco abuse.  Presented 03/23/2019 with left gangrenous open wound to foot and has been followed with outpatient antibiotics.  Ultimately patient known bilateral SFA occlusions monophasic runoff of the ankle for vascular studies earlier in the year.  Underwent left common femoral enterectomy and Dacron patch angioplasty as well as left femoral to below-knee popliteal bypass 03/28/2019 per Dr. Donnetta Hutching.  Hospital course pain management MRI of left foot showed septic arthritis of the fifth MTP joint with osteomyelitis involving the distal half of the fifth metatarsal and also the proximal phalanx.  Orthopedic service was consulted in regards to findings of MRI of left foot Dr. Sharol Given and underwent left  fifth ray amputation 04/06/2019.  Renal service continues to follow for history of CKD stage IV with hemodialysis as directed.   Therapy evaluations completed and patient is to be admitted for a comprehensive rehab program on 04/07/2019.    Patient's medical record from [facility] has been reviewed by the rehabilitation admission coordinator and physician.  Past Medical History  Past Medical History:  Diagnosis Date  . Allergy   . Arthritis    "maybe in my lower back" (08/27/2012)  . 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 Chronic combined systolic and diastolic CHF (congestive heart failure) ([place])   . CKD (chronic kidney disease), stage IV ([place])   . Coronary artery disease    a. NSTEMI in 05/2009 s/p CABG (LIMA-LAD, SVG-diagonal, SVG-OM1/OM 2).   Marland Kitchen Dyspnea    with exertion  . Family history of anesthesia complication    Mom has a hard time to wake up  . Foot ulcer ([place])     "I've had them on both feet" (08/27/2012)  . Gouty arthritis    "right index finger" (08/27/2012)  . History of radiation therapy 06/09/13-07/06/13   left breast 50Gy  . Hyperlipidemia   . Hypertension   . Infection    right second toe  . Ischemic cardiomyopathy    a. 2011: EF 40-45%. b. EF 55-60% in 04/2017 but shortly after as inpatient was 45-50%.  Marland Kitchen LBBB (left bundle branch block)   . Mitral regurgitation   . Neuropathy    Hx; of B/L feet  . NSTEMI (non-ST elevated myocardial infarction) ([place]) 05/23/2009  . Osteomyelitis of foot ([place])   . PAD (peripheral artery disease) ([facility])    a. LE PAD (patient previously elected hold off L fem-pop), prev followed by Dr. Bridgett Larsson  . PONV (postoperative nausea and vomiting)   . Sinus headache    "occasionally" (08/27/2012)  . Type II diabetes mellitus ([place])   . Vitamin D deficiency     Family History   family history includes Breast cancer in her maternal aunt; Breast cancer (age of onset: 74) in her sister; Breast cancer (age of onset: 89) in her sister; Hypertension in her brother; Kidney cancer (age of onset: 31) in her brother; Throat cancer (age of onset: 69) in her father.  Prior Rehab/Hospitalizations Has the patient had prior rehab or hospitalizations prior to admission? Yes  Has the patient had major surgery during 100 days prior to admission? Yes   Current Medications  Current Facility-Administered Medications:  .  0.9 %  sodium chloride infusion, 500 mL, Intravenous, Once PRN, Persons, Bevely Palmer, PA .  0.9 %  sodium chloride infusion, , Intravenous, Continuous, Persons, Bevely Palmer, [place], Last Rate: 250 mL/hr at 04/06/19 1317, Rate Change at 04/06/19 1317 .  acetaminophen (TYLENOL) tablet 325-650 mg, 325-650 mg, Oral, Q4H PRN **OR** acetaminophen (TYLENOL) suppository 325-650 mg, 325-650 mg, Rectal, Q4H PRN, Persons, Bevely Palmer, PA .  allopurinol (ZYLOPRIM) tablet 100 mg, 100 mg, Oral, BID, Persons, Bevely Palmer, PA, 100 mg at 04/06/19 2220 .   aspirin EC tablet 81 mg, 81 mg, Oral, Daily, Persons, Bevely Palmer, [place], 81 mg at 04/06/19 7628 .  atorvastatin (LIPITOR) tablet 10 mg, 10 mg, Oral, q1800, Persons, Bevely Palmer, PA, 10 mg at 04/06/19 1704 .  camphor-menthol (SARNA) lotion, , Topical, PRN, Persons, Bevely Palmer, PA .  ceFEPIme (MAXIPIME) 1 g in sodium chloride 0.9 % 100 mL IVPB, 1 g, Intravenous, Q24H, Pokhrel, [place],  MD, Last Rate: 200 mL/hr at 04/06/19 2228, 1 g at 04/06/19 2228 .  Chlorhexidine Gluconate Cloth 2 % PADS 6 each, 6 each, Topical, Q0600, Persons, Bevely Palmer, [place], 6 each at 04/06/19 [phone] .  cinacalcet (SENSIPAR) tablet 30 mg, 30 mg, Oral, Q supper, Persons, Bevely Palmer, PA, 30 mg at 04/06/19 1704 .  cyclobenzaprine (FLEXERIL) tablet 5 mg, 5 mg, Oral, TID PRN, Persons, Bevely Palmer, PA, 5 mg at 03/30/19 1226 .  Darbepoetin Alfa (ARANESP) injection 60 mcg, 60 mcg, Intravenous, Q Tue-HD, Persons, Bevely Palmer, [place], 60 mcg at 04/05/19 1125 .  docusate sodium (COLACE) capsule 100 mg, 100 mg, Oral, BID, Persons, Bevely Palmer, PA, 100 mg at 04/06/19 1706 .  doxercalciferol (HECTOROL) injection 4 mcg, 4 mcg, Intravenous, Q T,Th,Sa-HD, Persons, Bevely Palmer, [place], 4 mcg at 04/05/19 1126 .  feeding supplement (NEPRO CARB STEADY) liquid 237 mL, 237 mL, Oral, BID BM, Persons, Bevely Palmer, [place], Last Rate: 0 mL/hr at 04/03/19 2300, 237 mL at 04/07/19 0935 .  feeding supplement (PRO-STAT SUGAR FREE 64) liquid 30 mL, 30 mL, Oral, BID, Persons, Bevely Palmer, PA, 30 mL at 04/07/19 0935 .  hydrALAZINE (APRESOLINE) tablet 25 mg, 25 mg, Oral, Q8H, Persons, Bevely Palmer, [place], 25 mg at 04/07/19 [phone] .  hydrALAZINE (APRESOLINE) tablet 25 mg, 25 mg, Oral, Q6H PRN, Persons, Bevely Palmer, [place] .  HYDROcodone-acetaminophen (NORCO/VICODIN) 5-325 MG per tablet 0.5-1 tablet, 0.5-1 tablet, Oral, Q6H PRN, Persons, Bevely Palmer, [place], 1 tablet at 04/06/19 [phone] .  HYDROmorphone (DILAUDID) injection 0.5 mg, 0.5 mg, Intravenous, Q3H PRN, Persons, Bevely Palmer, PA, 0.5 mg at 03/31/19 0086 .  insulin aspart  (novoLOG) injection 0-15 Units, 0-15 Units, Subcutaneous, TID WC, Persons, Bevely Palmer, [place], 2 Units at 04/07/19 [phone] .  insulin aspart (novoLOG) injection 0-5 Units, 0-5 Units, Subcutaneous, QHS, Persons, Bevely Palmer, [place], 3 Units at 03/25/19 2254 .  insulin detemir (LEVEMIR) injection 10 Units, 10 Units, Subcutaneous, Daily, Persons, Bevely Palmer, [place], 10 Units at 04/06/19 1421 .  isosorbide dinitrate (ISORDIL) tablet 10 mg, 10 mg, Oral, TID, Persons, Bevely Palmer, PA, 10 mg at 04/06/19 2220 .  magnesium sulfate IVPB 2 g 50 mL, 2 g, Intravenous, Daily PRN, Persons, Bevely Palmer, PA .  multivitamin (RENA-VIT) tablet 1 tablet, 1 tablet, Oral, QHS, Persons, Bevely Palmer, [place], 1 tablet at 04/06/19 2221 .  ondansetron (ZOFRAN) injection 4 mg, 4 mg, Intravenous, Q6H PRN, Persons, Bevely Palmer, PA .  pantoprazole (PROTONIX) EC tablet 40 mg, 40 mg, Oral, Daily, Persons, Bevely Palmer, [place], 40 mg at 04/06/19 0825 .  phenol (CHLORASEPTIC) mouth spray 1 spray, 1 spray, Mouth/Throat, PRN, Persons, Bevely Palmer, PA .  polyethylene glycol (MIRALAX / GLYCOLAX) packet 17 g, 17 g, Oral, Daily, Persons, Bevely Palmer, [place], 17 g at 04/07/19 0935 .  senna-docusate (Senokot-S) tablet 1 tablet, 1 tablet, Oral, QHS PRN, Persons, Bevely Palmer, PA .  sevelamer carbonate (RENVELA) tablet 1,600 mg, 1,600 mg, Oral, TID WC, Persons, Bevely Palmer, PA, 1,600 mg at 04/07/19 0934 .  sevelamer carbonate (RENVELA) tablet 800 mg, 800 mg, Oral, BID PRN, Persons, Bevely Palmer, PA, 800 mg at 03/24/19 2248 .  traZODone (DESYREL) tablet 25-100 mg, 25-100 mg, Oral, QHS, Persons, Bevely Palmer, [place], 100 mg at 04/06/19 2221  Patients Current Diet:  Diet Order            Diet Carb Modified        Diet renal/carb modified with fluid restriction Diet-HS Snack? Nothing; Fluid restriction: 1200 mL Fluid;  Room service appropriate? Yes; Fluid consistency: Thin  Diet effective now              Precautions / Restrictions Precautions Precautions: Fall Restrictions Weight Bearing  Restrictions: No LLE Weight Bearing: Touchdown weight bearing Other Position/Activity Restrictions: TDWB through heel per ortho note   Has the patient had 2 or more falls or a fall with injury in the past year? No  Prior Activity Level Limited Community (1-2x/wk): would mostly only get out of the house during HD appointments and other MD appointments  Prior Functional Level Self Care: Did the patient need help bathing, dressing, using the toilet or eating? Independent  Indoor Mobility: Did the patient need assistance with walking from room to room (with or without device)? Independent  Stairs: Did the patient need assistance with internal or external stairs (with or without device)? Needed some help  Functional Cognition: Did the patient need help planning regular tasks such as shopping or remembering to take medications? [place] / [place] Devices/Equipment: Other (Comment)("secretarial chair" per pt) Home Equipment: Walker - 2 wheels, Transport chair, Wheelchair - manual, Grab bars - toilet  Prior Device Use: Indicate devices/aids used by the patient prior to current illness, exacerbation or injury? None of the above  Current Functional Level Cognition  Overall Cognitive Status: Within Functional Limits for tasks assessed Current Attention Level: Selective Orientation Level: Oriented X4 General Comments: tangential     Extremity Assessment (includes Sensation/Coordination)  Upper Extremity Assessment: Generalized weakness  Lower Extremity Assessment: Generalized weakness, LLE deficits/detail LLE Deficits / Details: L knee flex limited by pain, knee ext limited by ace wrap and pain; hip flex at least 3/5 LLE: Unable to fully assess due to pain LLE Sensation: decreased light touch LLE Coordination: decreased gross motor    ADLs  Overall ADL's : Needs assistance/impaired Grooming: Wash/dry hands, Wash/dry face, Oral care, Standing,  Supervision/safety Grooming Details (indicate cue type and reason): supervision for safety Upper Body Bathing: Min guard, Sitting Lower Body Bathing: Maximal assistance, Sitting/lateral leans Upper Body Dressing : Min guard, Sitting Lower Body Dressing: Maximal assistance, Total assistance, Sitting/lateral leans Lower Body Dressing Details (indicate cue type and reason): unable to don sock to L foot seated EOB Toilet Transfer: Min guard, Minimal assistance, Grab bars, Regular Toilet, RW, Ambulation, Cueing for safety Toilet Transfer Details (indicate cue type and reason): min guard for safety; MINA  for sit>stand from low toilet seat, heavy use of grab bars noted Toileting- Clothing Manipulation and Hygiene: Total assistance, Sit to/from stand Toileting - Clothing Manipulation Details (indicate cue type and reason): total A for posterior pericare for cleanliness Functional mobility during ADLs: Min guard, Rolling walker General ADL Comments: session focus on standing grooming tasks, toilet transfer and hygiene    Mobility  Overal bed mobility: Needs Assistance Bed Mobility: Supine to Sit, Sit to Supine, Rolling Rolling: Min assist Supine to sit: Min assist Sit to supine: Min assist General bed mobility comments: For supine sit: assist with trunk; For sit to supine: assist for leg;  Performed multiple rolls with min A and cues for technique; Performed scooting up in bed: with min A and cues initially and then mod A x 2; Performed scooting laterally in bed with mod A and cues for technique (moving shoulders, hips, then legs)    Transfers  Overall transfer level: Needs assistance Equipment used: Rolling walker (2 wheeled) Transfers: Sit to/from Stand, [employer] Transfers Sit to Stand: Min assist Stand pivot transfers:  Mod assist General transfer comment: cues for hand placement, min A to power up, cues for TDWB, performed sit to stand x 2 and stand pivot x 1    Ambulation / Gait / Stairs  / Wheelchair Mobility  Ambulation/Gait Ambulation/Gait assistance: Mod assist Gait Distance (Feet): 5 Feet Assistive device: Rolling walker (2 wheeled) Gait Pattern/deviations: Step-to pattern, Decreased stride length General Gait Details: fatigued easily; cues for TDWB; had difficulty with TDWB so required mod A to help maintain Gait velocity: Decreased    Posture / Balance Dynamic Sitting Balance Sitting balance - Comments: cues for UE placement to maximize sitting balance Balance Overall balance assessment: Needs assistance Sitting-balance support: Feet supported, No upper extremity supported Sitting balance-Leahy Scale: Good Sitting balance - Comments: cues for UE placement to maximize sitting balance Postural control: Posterior lean Standing balance support: Bilateral upper extremity supported, During functional activity Standing balance-Leahy Scale: Fair Standing balance comment: supervision for safety, but able to complete standing grooming tasks with no LOB    Special needs/care consideration BiPAP/CPAP : no CPM : no Continuous Drip IV : 0.9% sodium chloride infusion Dialysis : yes    Days : T/Th/Sat Life Vest : no Oxygen: on RA Special Bed : no Trach Size : no Wound Vac (area) : no      Location : no Skin : abrasion to bilateral Legs, cellulitis to bilateral foot/legs, ecchymosis to arm, hand (right, left), weeping of RLE; closed incision to left groin, closed incision to left leg, diabetic left and right foot ulcer, diabetic proximal left leg ulcer, diabetic ulcer right lower proximal leg.                      Bowel mgmt: last BM 04/01/2019 Bladder mgmt: oliguria  Diabetic mgmt: yes Behavioral consideration : no Chemo/radiation : not currently   Previous Home Environment (from acute therapy documentation) Living Arrangements: Spouse/significant other Available Help at Discharge: Family, Available 24 hours/day Type of Home: House Home Layout: Two level, Able to live  on main level with bedroom/bathroom Home Access: Stairs to enter Entrance Stairs-Number of Steps: 1 onto porch then 1 into main house, 2 steps into kitchen Bathroom Shower/Tub: Multimedia programmer: Handicapped height [place]: No Additional Comments: Husband currently recovering from [place] and using walker. Son visiting from out of town to provide assist as needed  Discharge Living Setting Plans for Discharge Living Setting: Patient's home, Lives with (comment)(pt's husband) Type of Home at Discharge: House Discharge Home Layout: One level(has full basement with stair lift) Discharge Home Access: Stairs to enter Entrance Stairs-Rails: Right(when going through kitchen entrance) Entrance Stairs-Number of Steps: 2 Discharge Bathroom Shower/Tub: Other (comment)(only sponge bathes due to foot issues ) Discharge Bathroom Toilet: Handicapped height Discharge Bathroom Accessibility: Yes How Accessible: Accessible via walker Does the patient have any problems obtaining your medications?: No  Social/Family/Support Systems Patient Roles: Spouse Contact Information: spouse [name]): [phone] Anticipated Caregiver: [name] Anticipated Caregiver's Contact Information: see above Ability/Limitations of Caregiver: supervision - pt's husband recently has hip surgery (12/18) Caregiver Availability: 24/7 Discharge Plan Discussed with Primary Caregiver: Yes Is Caregiver In Agreement with Plan?: Yes Does Caregiver/Family have Issues with Lodging/Transportation while Pt is in Rehab?: No  Goals/Additional Needs Patient/Family Goal for Rehab: PT/OT: Mod I; SLP: NA Expected length of stay: 5-7 days Cultural Considerations: NA Dietary Needs: renal/carb modified with fluid restriction. Thin liquids. Fluid restriction 1200 mL  Equipment Needs: TBD Special Service Needs: HD T/Th/Sat Pt/Family Agrees to Admission  and willing to participate: Yes Program Orientation Provided & Reviewed with  Pt/Caregiver Including Roles  & Responsibilities: Yes(pt and her husband)  Barriers to Discharge: Home environment access/layout, Lack of/limited family support, Hemodialysis  Barriers to Discharge Comments: two steps to enter; needs to be supervision to go home.   Decrease burden of Care through IP rehab admission:  None  Possible need for SNF placement upon discharge: Not anticipated. Pt has Supervision support from her husband at DC and anticipate pt can achieve that functional level through CIR stay.  Patient Condition: This patient's medical and functional status has changed since the consult dated: 04/02/2019 in which the Rehabilitation Physician determined and documented that the patient's condition is appropriate for intensive rehabilitative care in an inpatient rehabilitation facility. See "History of Present Illness" (above) for medical update. Functional changes are: improvement in functional transfers from Max A to Min/Mod A and improvement in gait from Mod A for 1 foot to Mod A for 5 feet. Patient's medical and functional status update has been discussed with the Rehabilitation physician and patient remains appropriate for inpatient rehabilitation. Will admit to inpatient rehab today.  Preadmission Screen Completed By:  Raechel Ache, 04/07/2019 11:59 AM ______________________________________________________________________   Discussed status with Dr. Dagoberto Ligas on 04/07/2019 at 11:59AM and received approval for admission today.  Admission Coordinator:  Raechel Ache, OT, time 11:59AM/Date 04/07/2019   Assessment/Plan: Diagnosis: 1. Does the need for close, 24 hr/day Medical supervision in concert with the patient's rehab needs make it unreasonable for this patient to be served in a less intensive setting? Yes 2. Co-Morbidities requiring supervision/potential complications: L fem-pop bypass, CHF- combined, CAD s/p CABG, ESRD on HD, PVD 3. Due to bowel management, safety, skin/wound care,  disease management, medication administration, pain management and patient education, does the patient require 24 hr/day rehab nursing? Yes 4. Does the patient require coordinated care of a physician, rehab nurse, PT, OT, and SLP to address physical and functional deficits in the context of the above medical diagnosis(es)? Yes Addressing deficits in the following areas: balance, endurance, locomotion, strength, transferring, bathing, dressing, feeding, grooming and toileting 5. Can the patient actively participate in an intensive therapy program of at least 3 hrs of therapy 5 days a week? Yes 6. The potential for patient to make measurable gains while on inpatient rehab is fair 7. Anticipated functional outcomes upon discharge from inpatient rehab: modified independent and supervision PT, modified independent and supervision OT, n/a SLP 8. Estimated rehab length of stay to reach the above functional goals is: 5-7 days 9. Anticipated discharge destination: Home 10. Overall Rehab/Functional Prognosis: fair   MD Signature:

## 2019-04-05 NOTE — Progress Notes (Addendum)
[facility] Progress Note   Subjective:  Seen in HD unit. Tolerating 1.5L UF. Tired this am. No new complaints   Objective Vitals:   04/04/19 1936 04/04/19 2115 04/05/19 0345 04/05/19 0652  BP: (!) 124/57 (!) 123/58 (!) 117/55 134/68  Pulse: 77  82   Resp: 17  20   Temp: 97.7 F (36.5 C)  98.2 F (36.8 C)   TempSrc: Oral  Oral   SpO2: 98%  99%   Weight:   69.2 kg   Height:        Physical Exam General: Elderly female, alert, nad  Heart: Regular Lungs: Clear bilaterally  Abdomen: soft non-tender  Extremities: Trace LE edema  Dialysis Access: RUE AVF +bruit    Weight change: -0.5 kg   Additional Objective Labs: Basic Metabolic Panel: Recent Labs  Lab 03/31/19 0715 04/03/19 0730 04/05/19 0724  NA 134* 133* 134*  K 4.0 3.9 4.2  CL 96* 94* 96*  CO2 23 25 21*  GLUCOSE 104* 146* 100*  BUN 38* 41* 45*  CREATININE 4.37* 5.19* 5.58*  CALCIUM 7.3* 7.3* 7.5*  PHOS 5.0* 4.7* 4.5   CBC: Recent Labs  Lab 03/31/19 0715 04/03/19 0729 04/05/19 0724  WBC 10.9* 8.7 7.1  HGB 7.3* 8.0* 8.0*  HCT 22.4* 25.4* 25.5*  MCV 96.6 99.2 99.6  PLT 221 246 221   Blood Culture    Component Value Date/Time   SDES BLOOD BLOOD LEFT FOREARM 03/23/2019 1538   SPECREQUEST  03/23/2019 1538    BOTTLES DRAWN AEROBIC AND ANAEROBIC Blood Culture results may not be optimal due to an excessive volume of blood received in culture bottles   CULT  03/23/2019 1538    NO GROWTH 5 DAYS Performed at [facility], [address], [place], [place] [address]    REPTSTATUS 03/28/2019 FINAL 03/23/2019 1538     Medications: . sodium chloride    . [START ON 04/06/2019] sodium chloride    . [START ON 04/06/2019] sodium chloride    . ceFEPime (MAXIPIME) IV 1 g (04/04/19 2122)  . magnesium sulfate bolus IVPB     . allopurinol  100 mg Oral BID  . aspirin EC  81 mg Oral Daily  . atorvastatin  10 mg Oral q1800  . Chlorhexidine Gluconate Cloth  6 each Topical Q0600  .  cinacalcet  30 mg Oral Q supper  . darbepoetin (ARANESP) injection - DIALYSIS  60 mcg Intravenous Q Tue-HD  . doxercalciferol  4 mcg Intravenous Q T,Th,Sa-HD  . feeding supplement (NEPRO CARB STEADY)  237 mL Oral BID BM  . feeding supplement (PRO-STAT SUGAR FREE 64)  30 mL Oral BID  . heparin      . hydrALAZINE  25 mg Oral Q8H  . insulin aspart  0-15 Units Subcutaneous TID WC  . insulin aspart  0-5 Units Subcutaneous QHS  . insulin detemir  10 Units Subcutaneous Daily  . isosorbide dinitrate  10 mg Oral TID  . multivitamin  1 tablet Oral QHS  . pantoprazole  40 mg Oral Daily  . polyethylene glycol  17 g Oral Daily  . sevelamer carbonate  1,600 mg Oral TID WC  . traZODone  25-100 mg Oral QHS    Dialysis Orders:  [place], TTS, EDW 72 kg, BFR 350, DFR 800, Optiflux 180, 3K/2.25 calcium, vancomycin 750 mg q. HD, Mircera 50 mcg every 2 weeks, Hectorol 4 mcg IV 3 times weekly, heparin 5000 unit bolus. Right radiocephalic fistula.  Assessment/Plan: 1.  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   Antibiotics narrowed to IV cefepime. Plans for left 5th ray amp on Wed.  2. ESRD - HD TTS. HD today on schedule  3. HTN/volume-  BP controlled. No gross volume overload on exam. Below EDW. Post HD wt 69.7kg. Titrate down as tolerated.  4. Anemia- Hgb low post-op. Aranesp 60 q Tues. Tsat 19%. Start Fe course when antibiotics complete/infection resolved.  5. Metabolic Bone Disease-  Corr Ca/Phos ok. Continue Renvela binder/VDRA.  6. Nutrition - Add prot supp for low albumin  Dispo:  To continue ongoing PT/OT  following admission to CIR or SNF.  Lynnda Child PA-C [facility] Pager [phone] 04/05/2019,8:45 AM  LOS: 13 days    Pt seen, examined and agree w A/P as above.  [name]  MD 04/05/2019, 12:25 PM

## 2019-04-06 ENCOUNTER — Encounter (HOSPITAL_COMMUNITY): Payer: Self-pay | Admitting: Internal Medicine

## 2019-04-06 ENCOUNTER — Inpatient Hospital Stay (HOSPITAL_COMMUNITY): Payer: Medicare Other | Admitting: Certified Registered Nurse Anesthetist

## 2019-04-06 ENCOUNTER — Encounter (HOSPITAL_COMMUNITY)
Admission: EM | Disposition: A | Discharge status: Rehabilitation Facility | Payer: Self-pay | Source: Home / Self Care | Attending: Internal Medicine

## 2019-04-06 HISTORY — PX: AMPUTATION: SHX166

## 2019-04-06 LAB — GLUCOSE, CAPILLARY
Glucose-Capillary: 119 mg/dL — ABNORMAL HIGH (ref 70–99)
Glucose-Capillary: 110 mg/dL — ABNORMAL HIGH (ref 70–99)
Glucose-Capillary: 159 mg/dL — ABNORMAL HIGH (ref 70–99)
Glucose-Capillary: 149 mg/dL — ABNORMAL HIGH (ref 70–99)
Glucose-Capillary: 113 mg/dL — ABNORMAL HIGH (ref 70–99)
Glucose-Capillary: 160 mg/dL — ABNORMAL HIGH (ref 70–99)

## 2019-04-06 SURGERY — AMPUTATION, FOOT, RAY
Anesthesia: Regional | Site: Foot | Laterality: Left

## 2019-04-06 MED ORDER — SODIUM CHLORIDE 0.9 % IV SOLN: INTRAVENOUS | Status: DC

## 2019-04-06 MED ORDER — MIDAZOLAM HCL 2 MG/2ML IJ SOLN
INTRAMUSCULAR | Status: AC
  Administered 2019-04-06: 1 mg via INTRAVENOUS
  Filled 2019-04-06: qty 2

## 2019-04-06 MED ORDER — DOCUSATE SODIUM 100 MG PO CAPS
100.0000 mg | ORAL_CAPSULE | Freq: Two times a day (BID) | ORAL | Status: DC
  Administered 2019-04-06: 100 mg via ORAL
  Filled 2019-04-06 (×2): qty 1

## 2019-04-06 MED ORDER — PROPOFOL 500 MG/50ML IV EMUL: INTRAVENOUS | Status: DC | PRN

## 2019-04-06 MED ORDER — MIDAZOLAM HCL 2 MG/2ML IJ SOLN: 1.0000 mg | Freq: Once | INTRAMUSCULAR | Status: AC

## 2019-04-06 MED ORDER — PROPOFOL 500 MG/50ML IV EMUL
INTRAVENOUS | Status: DC | PRN
  Administered 2019-04-06: 75 ug/kg/min via INTRAVENOUS

## 2019-04-06 MED ORDER — 0.9 % SODIUM CHLORIDE (POUR BTL) OPTIME
TOPICAL | Status: DC | PRN
  Administered 2019-04-06: 1000 mL

## 2019-04-06 MED ORDER — PROPOFOL 10 MG/ML IV BOLUS
INTRAVENOUS | Status: AC
  Filled 2019-04-06: qty 20

## 2019-04-06 MED ORDER — FENTANYL CITRATE (PF) 250 MCG/5ML IJ SOLN
INTRAMUSCULAR | Status: AC
  Filled 2019-04-06: qty 5

## 2019-04-06 MED ORDER — FENTANYL CITRATE (PF) 100 MCG/2ML IJ SOLN
INTRAMUSCULAR | Status: AC
  Administered 2019-04-06: 50 ug via INTRAVENOUS
  Filled 2019-04-06: qty 2

## 2019-04-06 MED ORDER — FENTANYL CITRATE (PF) 100 MCG/2ML IJ SOLN: 50.0000 ug | Freq: Once | INTRAMUSCULAR | Status: AC

## 2019-04-06 MED ORDER — ONDANSETRON HCL 4 MG/2ML IJ SOLN
INTRAMUSCULAR | Status: DC | PRN
  Administered 2019-04-06: 4 mg via INTRAVENOUS

## 2019-04-06 MED ORDER — PHENYLEPHRINE HCL (PRESSORS) 10 MG/ML IV SOLN
INTRAVENOUS | Status: DC | PRN
  Administered 2019-04-06 (×3): 80 ug via INTRAVENOUS

## 2019-04-06 MED ORDER — ACETAMINOPHEN 500 MG PO TABS: 1000.0000 mg | ORAL_TABLET | Freq: Once | ORAL | Status: DC

## 2019-04-06 MED ORDER — BUPIVACAINE HCL (PF) 0.5 % IJ SOLN
INTRAMUSCULAR | Status: DC | PRN
  Administered 2019-04-06: 25 mL via PERINEURAL

## 2019-04-06 SURGICAL SUPPLY — 30 items
BLADE SAW SGTL MED 73X18.5 STR (BLADE) IMPLANT
BLADE SURG 21 STRL SS (BLADE) ×3 IMPLANT
BNDG COHESIVE 3X5 TAN STRL LF (GAUZE/BANDAGES/DRESSINGS) ×2 IMPLANT
BNDG COHESIVE 4X5 TAN STRL (GAUZE/BANDAGES/DRESSINGS) ×3 IMPLANT
BNDG GAUZE ELAST 4 BULKY (GAUZE/BANDAGES/DRESSINGS) ×3 IMPLANT
COVER SURGICAL LIGHT HANDLE (MISCELLANEOUS) ×4 IMPLANT
COVER WAND RF STERILE (DRAPES) ×1 IMPLANT
DRAPE U-SHAPE 47X51 STRL (DRAPES) ×4 IMPLANT
DRSG ADAPTIC 3X8 NADH LF (GAUZE/BANDAGES/DRESSINGS) ×3 IMPLANT
DRSG PAD ABDOMINAL 8X10 ST (GAUZE/BANDAGES/DRESSINGS) ×4 IMPLANT
DURAPREP 26ML APPLICATOR (WOUND CARE) ×3 IMPLANT
ELECT REM PT RETURN 9FT ADLT (ELECTROSURGICAL) ×3
ELECTRODE REM PT RTRN 9FT ADLT (ELECTROSURGICAL) ×1 IMPLANT
GAUZE SPONGE 4X4 12PLY STRL (GAUZE/BANDAGES/DRESSINGS) ×3 IMPLANT
GLOVE BIOGEL PI IND STRL 9 (GLOVE) ×1 IMPLANT
GLOVE BIOGEL PI INDICATOR 9 (GLOVE) ×2
GLOVE SURG ORTHO 9.0 STRL STRW (GLOVE) ×3 IMPLANT
GOWN STRL REUS W/ TWL XL LVL3 (GOWN DISPOSABLE) ×2 IMPLANT
GOWN STRL REUS W/TWL XL LVL3 (GOWN DISPOSABLE) ×6
KIT BASIN OR (CUSTOM PROCEDURE TRAY) ×3 IMPLANT
KIT TURNOVER KIT B (KITS) ×3 IMPLANT
NS IRRIG 1000ML POUR BTL (IV SOLUTION) ×3 IMPLANT
PACK ORTHO EXTREMITY (CUSTOM PROCEDURE TRAY) ×3 IMPLANT
PAD ARMBOARD 7.5X6 YLW CONV (MISCELLANEOUS) ×6 IMPLANT
STOCKINETTE IMPERVIOUS LG (DRAPES) IMPLANT
SUT ETHILON 2 0 PSLX (SUTURE) ×3 IMPLANT
TOWEL GREEN STERILE (TOWEL DISPOSABLE) ×3 IMPLANT
TUBE CONNECTING 12'X1/4 (SUCTIONS) ×1
TUBE CONNECTING 12X1/4 (SUCTIONS) ×2 IMPLANT
YANKAUER SUCT BULB TIP NO VENT (SUCTIONS) ×3 IMPLANT

## 2019-04-06 NOTE — Anesthesia Procedure Notes (Signed)
Procedure Name: MAC Date/Time: 04/06/2019 12:58 PM Performed by: Oletta Lamas, CRNA Pre-anesthesia Checklist: Patient identified, Emergency Drugs available, Suction available and Patient being monitored Patient Re-evaluated:Patient Re-evaluated prior to induction Oxygen Delivery Method: Simple face mask

## 2019-04-06 NOTE — Anesthesia Preprocedure Evaluation (Addendum)
Anesthesia Evaluation  Patient identified by MRN, date of birth, ID band Patient awake    Reviewed: Allergy & Precautions, Patient's Chart, lab work & pertinent test results  History of Anesthesia Complications (+) PONV and history of anesthetic complications  Airway Mallampati: II  TM Distance: >3 FB Neck ROM: Full    Dental no notable dental hx.    Pulmonary asthma , COPD, former smoker,    Pulmonary exam normal        Cardiovascular hypertension, Pt. on medications + CAD, + Past MI, + CABG (2011), + Peripheral Vascular Disease and +CHF  Normal cardiovascular exam     Neuro/Psych negative neurological ROS  negative psych ROS   GI/Hepatic Neg liver ROS, GERD  Medicated,  Endo/Other  diabetes, Type 2, Insulin Dependent  Renal/GU Dialysis and ESRFRenal disease  negative genitourinary   Musculoskeletal  (+) Arthritis ,   Abdominal   Peds  Hematology  (+) anemia , Hgb 8.0   Anesthesia Other Findings Day of surgery medications reviewed with patient.  Reproductive/Obstetrics negative OB ROS                            Anesthesia Physical Anesthesia Plan  ASA: III  Anesthesia Plan: Regional   Post-op Pain Management:    Induction:   PONV Risk Score and Plan: 3 and Treatment may vary due to age or medical condition, Ondansetron and Propofol infusion  Airway Management Planned: Natural Airway and Simple Face Mask  Additional Equipment: None  Intra-op Plan:   Post-operative Plan:   Informed Consent: I have reviewed the patients History and Physical, chart, labs and discussed the procedure including the risks, benefits and alternatives for the proposed anesthesia with the patient or authorized representative who has indicated his/her understanding and acceptance.       Plan Discussed with: CRNA  Anesthesia Plan Comments:        Anesthesia Quick Evaluation

## 2019-04-06 NOTE — Progress Notes (Addendum)
PROGRESS NOTE  [name] JKK:[id number] DOB: [birth year] DOA: 03/23/2019 PCP: Unk Pinto, MD   LOS: 14 days   Brief narrative:  As Per HPI, [name] 73 year old female with PMH of CAD s/p CABG, chronic combined CHF/ICM, stage IV CKD (baseline creatinine 2.7-3.1), HTN, HLD, DM, lower extremity PAD (bilateral SFA occlusion 07/2018), chronic bilateral feet ulcerations, osteomyelitis of left toe, LBBB with chronic wound on her left foot that is followed at the wound care center closely did recently note worsening infection. She was then started on IV antibiotics including vancomycin at dialysis (reportedly completed 3 doses) and was sent to [facility] from the wound care center due to low-grade fevers and increased drainage from the ulcer. Admitted for left little toe osteomyelitis, abscess and sepsis complicating PAD. Nephrology consulted for HD needs. S/p aortogram with bilateral lower extremity arteriogram with runoff 12/18 followed by left CFA endarterectomy and Dacron patch angioplasty, left femoral to below-knee popliteal bypass graft on 12/21. Orthopedics/Dr. Sharol Given plans surgical intervention on 04/06/2019 after vascular procedure.  Assessment/Plan:  Principal Problem:   Osteomyelitis of foot, left, acute (HCC) Active Problems:   Essential hypertension   PVD (peripheral vascular disease) (HCC)   Diabetes mellitus type 2, insulin dependent ([place])   ESRD on hemodialysis ([place])   Pyogenic inflammation of bone (HCC)   Septic arthritis of left foot (HCC)   Cellulitis of left foot   Abscess of right foot   Chronic bilateral low back pain without sciatica  Osteomyelitis-septic arthritis left fifth metatarsal joint, abscess and ulcer in setting of Peripheral artery disease  Failed vancomycin and Zosyn.  Outpatient wound culture growing Serratia.  Transition to cefepime on 2/25.  MRI of the left foot shows septic arthritis of the fifth metatarsal phalangeal joint with  osteomyelitis with surrounding phlegmon and abscess.  Diffuse cellulitis and myositis noted.  On 12/18 patient underwent lower extremity arteriogram with revealed 70% stenosis of the left common femoral artery with heavily calcified plaque, SFA occluded throughout proximal mid and distal segment-50% right common femoral artery stenosis and right SFA occlusion throughout the entire segment.  On 12/21-patient underwent left CFA endarterectomy and Dacron patch angioplasty and left below the knee popliteal bypass graft.  Patient has been seen by Dr. Sharol Given orthopedics today and is planning for left foot fifth ray amputation.   ulcers on right leg and foot Chronic.  Likely ischemic.  Follows up at  wound care center    Essential hypertension Continue hydralazine and isosorbide dinitrate.  Closely monitor blood pressure.    Diabetes mellitus type 2, insulin dependent  -Last hemoglobin A1c was 6.3-at home patient takes 70/30 insulin Continue Levemir and sliding scale insulin in the hospital.  Diabetic diet, Accu-Cheks.    Last POC glucose was 159.  ESRD on hemodialysis  -On Tuesday Thursday Saturday schedule.  Nephrology on board.  Chronic systolic heart failure -Fluid management with dialysis  Gout -Continue allopurinol  Anemia of chronic disease -Stable.  No acute bleeding noted. Check hemoglobin in am  VTE Prophylaxis: Hold for surgery  Code Status: Full code  Family Communication:  I spoke with the patient's spouse on the phone and updated him about the clinical condition of the patient and answered his queries. He strongly wishes the patient to go for inpatient rehab.   Disposition Plan: Physical therapy has seen the patient and recommend CIR.Marland Kitchen   CIR has been consulted.  Dr Sharol Given planning left foot ray amputation of the 5th toe today.   Consultants:  Vascular surgery  Orthopedic surgery  Nephrology  Procedures:  S/p aortogram with bilateral lower extremity arteriogram  with runoff 12/18 by Dr. Carlis Abbott, vascular surgery  Left CFA endarterectomy and Dacron patch angioplasty, left femoral to below-knee popliteal bypass graft on 12/21 by Dr. Donnetta Hutching, vascular surgery  Hemodialysis.  Antibiotics:   Anti-infectives (From admission, onward)   Start     Dose/Rate Route Frequency Ordered Stop   04/06/19 0600  ceFAZolin (ANCEF) IVPB 2g/100 mL premix     2 g 200 mL/hr over 30 Minutes Intravenous On call to O.R. 04/05/19 1917 04/07/19 0559   04/01/19 2200  ceFEPIme (MAXIPIME) 1 g in sodium chloride 0.9 % 100 mL IVPB     1 g 200 mL/hr over 30 Minutes Intravenous Every 24 hours 04/01/19 1516     03/31/19 0000  Vancomycin (VANCOCIN) 750-5 MG/150ML-% SOLN     750 mg Intravenous Every T-Th-Sa (Hemodialysis) 03/30/19 1211     03/30/19 0000  piperacillin-tazobactam (ZOSYN) 2-0.25 GM/50ML IVPB     2.25 g Intravenous Every 8 hours 03/30/19 1211     03/29/19 1200  vancomycin (VANCOCIN) IVPB 750 mg/150 ml premix  Status:  Discontinued     750 mg 150 mL/hr over 60 Minutes Intravenous Every T-Th-Sa (Hemodialysis) 03/26/19 2303 04/01/19 1507   03/28/19 0600  cefUROXime (ZINACEF) 1.5 g in sodium chloride 0.9 % 100 mL IVPB     1.5 g 200 mL/hr over 30 Minutes Intravenous On call to O.R. 03/27/19 1132 03/28/19 1453   03/26/19 2359  vancomycin (VANCOREADY) IVPB 500 mg/100 mL     500 mg 100 mL/hr over 60 Minutes Intravenous  Once 03/26/19 2303 03/27/19 0716   03/25/19 1730  vancomycin (VANCOCIN) IVPB 750 mg/150 ml premix     750 mg 150 mL/hr over 60 Minutes Intravenous  Once 03/25/19 1513 03/25/19 1627   03/24/19 1800  piperacillin-tazobactam (ZOSYN) IVPB 2.25 g     2.25 g 100 mL/hr over 30 Minutes Intravenous Every 8 hours 03/24/19 1631 04/01/19 1700   03/24/19 0000  vancomycin variable dose per unstable renal function (pharmacist dosing)  Status:  Discontinued      Does not apply See admin instructions 03/23/19 1528 03/29/19 0720   03/23/19 1530  vancomycin (VANCOREADY) IVPB  1500 mg/300 mL     1,500 mg 150 mL/hr over 120 Minutes Intravenous  Once 03/23/19 1524 03/23/19 1949   03/23/19 1515  piperacillin-tazobactam (ZOSYN) IVPB 3.375 g     3.375 g 100 mL/hr over 30 Minutes Intravenous  Once 03/23/19 1504 03/23/19 1556     Subjective: Today, patient complains of constipation for the last 2 to 3 days.  Denies overt pain, shortness of breath, nausea vomiting.  Objective: Vitals:   04/06/19 0635 04/06/19 0741  BP: (!) 130/58 (!) 143/66  Pulse:  85  Resp:  20  Temp:  97.6 F (36.4 C)  SpO2:  96%    Intake/Output Summary (Last 24 hours) at 04/06/2019 1036 Last data filed at 04/05/2019 1430 Gross per 24 hour  Intake 240 ml  Output 1505 ml  Net -1265 ml   Filed Weights   04/05/19 0745 04/05/19 1120 04/06/19 0500  Weight: 70.3 kg 70.7 kg 75.4 kg   Body mass index is 28.53 kg/m.   Physical Exam: GENERAL: Patient is alert awake and oriented. Not in obvious distress.  Obese, HENT: No scleral pallor or icterus. Pupils equally reactive to light. Oral mucosa is moist NECK: is supple, no palpable thyroid enlargement. CHEST: Clear to auscultation.  No crackles or wheezes. Non tender on palpation. Diminished breath sounds bilaterally. CVS: S1 and S2 heard, no murmur. Regular rate and rhythm. No pericardial rub. ABDOMEN: Soft, non-tender, bowel sounds are present. No palpable hepato-splenomegaly. EXTREMITIES: Left leg with ulcers, right leg with ulcers, trace left leg edema. CNS: Cranial nerves are intact. No focal motor or sensory deficits. SKIN: warm and dry without rashes.  Data Review: I have personally reviewed the following laboratory data and studies,  CBC: Recent Labs  Lab 03/31/19 0715 04/03/19 0729 04/05/19 0724  WBC 10.9* 8.7 7.1  HGB 7.3* 8.0* 8.0*  HCT 22.4* 25.4* 25.5*  MCV 96.6 99.2 99.6  PLT 221 246 384   Basic Metabolic Panel: Recent Labs  Lab 03/31/19 0715 04/03/19 0730 04/05/19 0724  NA 134* 133* 134*  K 4.0 3.9 4.2  CL  96* 94* 96*  CO2 23 25 21*  GLUCOSE 104* 146* 100*  BUN 38* 41* 45*  CREATININE 4.37* 5.19* 5.58*  CALCIUM 7.3* 7.3* 7.5*  PHOS 5.0* 4.7* 4.5   Liver Function Tests: Recent Labs  Lab 03/31/19 0715 04/03/19 0730 04/05/19 0724  ALBUMIN 2.3* 2.4* 2.3*   No results for input(s): LIPASE, AMYLASE in the last 168 hours. No results for input(s): AMMONIA in the last 168 hours. Cardiac Enzymes: No results for input(s): CKTOTAL, CKMB, CKMBINDEX, TROPONINI in the last 168 hours. BNP (last 3 results) Recent Labs    08/25/18 0026  BNP 825.0*    ProBNP (last 3 results) No results for input(s): PROBNP in the last 8760 hours.  CBG: Recent Labs  Lab 04/05/19 1249 04/05/19 1617 04/05/19 2127 04/06/19 0633 04/06/19 1028  GLUCAP 87 119* 189* 110* 159*   No results found for this or any previous visit (from the past 240 hour(s)).   Studies: No results found.  Scheduled Meds: . allopurinol  100 mg Oral BID  . aspirin EC  81 mg Oral Daily  . atorvastatin  10 mg Oral q1800  . Chlorhexidine Gluconate Cloth  6 each Topical Q0600  . cinacalcet  30 mg Oral Q supper  . darbepoetin (ARANESP) injection - DIALYSIS  60 mcg Intravenous Q Tue-HD  . doxercalciferol  4 mcg Intravenous Q T,Th,Sa-HD  . feeding supplement (NEPRO CARB STEADY)  237 mL Oral BID BM  . feeding supplement (PRO-STAT SUGAR FREE 64)  30 mL Oral BID  . hydrALAZINE  25 mg Oral Q8H  . insulin aspart  0-15 Units Subcutaneous TID WC  . insulin aspart  0-5 Units Subcutaneous QHS  . insulin detemir  10 Units Subcutaneous Daily  . isosorbide dinitrate  10 mg Oral TID  . multivitamin  1 tablet Oral QHS  . pantoprazole  40 mg Oral Daily  . polyethylene glycol  17 g Oral Daily  . sevelamer carbonate  1,600 mg Oral TID WC  . traZODone  25-100 mg Oral QHS    Continuous Infusions: . sodium chloride    .  ceFAZolin (ANCEF) IV    . ceFEPime (MAXIPIME) IV 1 g (04/05/19 2212)  . magnesium sulfate bolus IVPB       Flora Lipps, MD  [facility] 04/06/2019

## 2019-04-06 NOTE — Progress Notes (Signed)
Physical Therapy Treatment Patient Details Name: [name] MRN: [id number] DOB: [birth year] Today's Date: 04/06/2019    History of Present Illness Pt is a 73 y.o. female admitted 03/23/19 from wound care center due to infected L foot wound that failed outpatient antibiotics. S/p L CFA endarterectomy and femoral to BK bypass on 12/21. Potential for surgical intervention of L foot pending vascular intervention status. PMH includes CAD, CHF/ICM, CKD IV, HTN, DM, PAD (bilateral SFA occlusion 07/2018), L toe osteomyelitis.    PT Comments    Patient seen for mobility progression. This session focused on gait training and practicing functional transfers with anticipated weight bearing precautions after surgery today. Pt is making progress toward PT goals and tolerated gait distance of 30 ft with min A.  Continue to progress as tolerated.    Follow Up Recommendations  CIR;Supervision for mobility/OOB     Equipment Recommendations  (defer)    Recommendations for Other Services Rehab consult     Precautions / Restrictions Precautions Precautions: Fall Restrictions Weight Bearing Restrictions: No    Mobility  Bed Mobility Overal bed mobility: Needs Assistance Bed Mobility: Supine to Sit     Supine to sit: Supervision     General bed mobility comments: HOB elevated; cues for sequencing and use of rail  Transfers Overall transfer level: Needs assistance Equipment used: Rolling walker (2 wheeled);1 person hand held assist Transfers: Sit to/from Stand Sit to Stand: From elevated surface;Min assist         General transfer comment: assist to power up and to steady when transitioning hand placement; cues for technique and practiced trying to keep weight toward L heel given upcoming surgery  Ambulation/Gait Ambulation/Gait assistance: Min assist;Min guard Gait Distance (Feet): 30 Feet Assistive device: Rolling walker (2 wheeled) Gait Pattern/deviations: Step-to  pattern;Decreased weight shift to left;Antalgic;Trunk flexed;Decreased stance time - left;Decreased step length - right Gait velocity: Decreased   General Gait Details: cues for posture/forward gaze and sequencing; assist to steady   Stairs             Wheelchair Mobility    Modified Rankin (Stroke Patients Only)       Balance Overall balance assessment: Needs assistance Sitting-balance support: Feet supported;No upper extremity supported Sitting balance-Leahy Scale: Fair     Standing balance support: Bilateral upper extremity supported;During functional activity Standing balance-Leahy Scale: Poor(while ambulating )                              Cognition Arousal/Alertness: Awake/alert Behavior During Therapy: WFL for tasks assessed/performed Overall Cognitive Status: Within Functional Limits for tasks assessed                                        Exercises      General Comments        Pertinent Vitals/Pain Pain Assessment: Faces Faces Pain Scale: Hurts a little bit Pain Location: L LE and back Pain Descriptors / Indicators: Guarding;Sore Pain Intervention(s): Limited activity within patient's tolerance;Monitored during session;Premedicated before session;Repositioned    Home Living                      Prior Function            PT Goals (current goals can now be found in the care plan section) Acute Rehab PT Goals Patient Stated  Goal: Hopeful to d/c home, but willing to consider post-acute rehab if needed Progress towards PT goals: Progressing toward goals    Frequency    Min 2X/week      PT Plan Current plan remains appropriate    Co-evaluation              AM-PAC PT "6 Clicks" Mobility   Outcome Measure  Help needed turning from your back to your side while in a flat bed without using bedrails?: A Little Help needed moving from lying on your back to sitting on the side of a flat bed without  using bedrails?: A Little Help needed moving to and from a bed to a chair (including a wheelchair)?: A Little Help needed standing up from a chair using your arms (e.g., wheelchair or bedside chair)?: A Little Help needed to walk in hospital room?: A Little Help needed climbing 3-5 steps with a railing? : A Lot 6 Click Score: 17    End of Session Equipment Utilized During Treatment: Gait belt Activity Tolerance: Patient tolerated treatment well Patient left: with call bell/phone within reach;Other (comment)(pt up at sink with OT ) Nurse Communication: Mobility status PT Visit Diagnosis: Other abnormalities of gait and mobility (R26.89);Muscle weakness (generalized) (M62.81);Pain Pain - Right/Left: Left Pain - part of body: Leg     Time: 1855-0158 PT Time Calculation (min) (ACUTE ONLY): 29 min  Charges:  $Gait Training: 23-37 mins                     Earney Navy, PTA Acute Rehabilitation Services Pager: [phone] Office: [phone]     Darliss Cheney 04/06/2019, 10:04 AM

## 2019-04-06 NOTE — Progress Notes (Signed)
Occupational Therapy Treatment Patient Details Name: [name] MRN: [id number] DOB: 06-18-1945 Today's Date: 04/06/2019    History of present illness Pt is a 73 y.o. female admitted 03/23/19 from wound care center due to infected L foot wound that failed outpatient antibiotics. S/p L CFA endarterectomy and femoral to BK bypass on 12/21. Potential for surgical intervention of L foot pending vascular intervention status. PMH includes CAD, CHF/ICM, CKD IV, HTN, DM, PAD (bilateral SFA occlusion 07/2018), L toe osteomyelitis.   OT comments  Pt making steady progress towards OT goals this session. Pt received from PT agreeable to complete standing grooming tasks at sink. Pt able to stand at sink ~ 10 minutes for oral care and washing face before reporting fatigue. Pt completed functional mobility with RW and min guard assist from sink>bathroom>recliner. Pt required MIN A for sit>stand from low toilet seat and total A for posterior pericare d/t cleanliness. DC plan currently remains appropriate, will continue to follow acutely per POC.    Follow Up Recommendations  CIR;Supervision/Assistance - 24 hour    Equipment Recommendations  Other (comment)(defer to next venue of care)    Recommendations for Other Services      Precautions / Restrictions Precautions Precautions: Fall Restrictions Weight Bearing Restrictions: No       Mobility Bed Mobility Overal bed mobility: Needs Assistance Bed Mobility: Supine to Sit     Supine to sit: Supervision     General bed mobility comments: OOB with PT upon arrival  Transfers Overall transfer level: Needs assistance Equipment used: Rolling walker (2 wheeled) Transfers: Sit to/from Stand Sit to Stand: Min assist         General transfer comment: MIN A to power up from low toilet seat, cues for hand placement    Balance Overall balance assessment: Needs assistance Sitting-balance support: Feet supported;No upper extremity  supported Sitting balance-Leahy Scale: Fair     Standing balance support: Single extremity supported;No upper extremity supported;During functional activity Standing balance-Leahy Scale: Fair Standing balance comment: supervision for safety, but able to complete standing grooming tasks with no LOB                           ADL either performed or assessed with clinical judgement   ADL Overall ADL's : Needs assistance/impaired     Grooming: Wash/dry hands;Wash/dry face;Oral care;Standing;Supervision/safety Grooming Details (indicate cue type and reason): supervision for safety                 Toilet Transfer: Min guard;Minimal assistance;Grab bars;Regular Toilet;RW;Ambulation;Cueing for Office manager Details (indicate cue type and reason): min guard for safety; [place]  for sit>stand from low toilet seat, heavy use of grab bars noted Toileting- Clothing Manipulation and Hygiene: Total assistance;Sit to/from stand Toileting - Clothing Manipulation Details (indicate cue type and reason): total A for posterior pericare for cleanliness     Functional mobility during ADLs: Min guard;Rolling walker General ADL Comments: session focus on standing grooming tasks, toilet transfer and hygiene     Vision Patient Visual Report: No change from baseline     Perception     Praxis      Cognition Arousal/Alertness: Awake/alert Behavior During Therapy: WFL for tasks assessed/performed Overall Cognitive Status: Within Functional Limits for tasks assessed  Exercises     Shoulder Instructions       General Comments      Pertinent Vitals/ Pain       Pain Assessment: Faces Faces Pain Scale: Hurts a little bit Pain Location: LLE Pain Descriptors / Indicators: Guarding;Sore Pain Intervention(s): Limited activity within patient's tolerance;Monitored during session;Repositioned  Home Living                                           Prior Functioning/Environment              Frequency  Min 2X/week        Progress Toward Goals  OT Goals(current goals can now be found in the care plan section)  Progress towards OT goals: Progressing toward goals  Acute Rehab OT Goals Patient Stated Goal: Hopeful to d/c home, but willing to consider post-acute rehab if needed OT Goal Formulation: With patient Time For Goal Achievement: 04/12/19 Potential to Achieve Goals: Good  Plan Discharge plan remains appropriate    Co-evaluation                 AM-PAC OT "6 Clicks" Daily Activity     Outcome Measure   Help from another person eating meals?: None Help from another person taking care of personal grooming?: None Help from another person toileting, which includes using toliet, bedpan, or urinal?: A Little Help from another person bathing (including washing, rinsing, drying)?: A Little Help from another person to put on and taking off regular upper body clothing?: A Little Help from another person to put on and taking off regular lower body clothing?: A Little 6 Click Score: 20    End of Session Equipment Utilized During Treatment: Gait belt;Rolling walker  OT Visit Diagnosis: Unsteadiness on feet (R26.81);Other abnormalities of gait and mobility (R26.89);Muscle weakness (generalized) (M62.81);Pain Pain - Right/Left: Left Pain - part of body: Leg   Activity Tolerance Patient tolerated treatment well   Patient Left in chair;with call bell/phone within reach   Nurse Communication Mobility status(BM)        Time: 6301-6010 OT Time Calculation (min): 47 min  Charges: OT General Charges $OT Visit: 1 Visit OT Treatments $Self Care/Home Management : 38-52 mins  Lanier Clam., COTA/L Acute Rehabilitation Services [phone] [place] 04/06/2019, 11:18 AM

## 2019-04-06 NOTE — Interval H&P Note (Signed)
History and Physical Interval Note:  04/06/2019 6:47 AM  [name]  has presented today for surgery, with the diagnosis of Osteomyelitis Left 5th Ray.  The various methods of treatment have been discussed with the patient and family. After consideration of risks, benefits and other options for treatment, the patient has consented to  Procedure(s): LEFT 5TH RAY AMPUTATION (Left) as a surgical intervention.  The patient's history has been reviewed, patient examined, no change in status, stable for surgery.  I have reviewed the patient's chart and labs.  Questions were answered to the patient's satisfaction.     Newt Minion

## 2019-04-06 NOTE — Op Note (Signed)
03/23/2019 - 04/06/2019  1:17 PM  PATIENT:  [name]    PRE-OPERATIVE DIAGNOSIS:  Osteomyelitis Left fifth Ray  POST-OPERATIVE DIAGNOSIS:  Same  PROCEDURE:  LEFT 5TH RAY AMPUTATION Local tissue rearrangement for wound closure 3 x 7 cm.   SURGEON:  Newt Minion, MD  PHYSICIAN ASSISTANT:None ANESTHESIA:   General  PREOPERATIVE INDICATIONS:  [name] is a  73 y.o. female with a diagnosis of Osteomyelitis Left fifth Ray who failed conservative measures and elected for surgical management.    The risks benefits and alternatives were discussed with the patient preoperatively including but not limited to the risks of infection, bleeding, nerve injury, cardiopulmonary complications, the need for revision surgery, among others, and the patient was willing to proceed.  OPERATIVE IMPLANTS: None  @ENCIMAGES @  OPERATIVE FINDINGS: Good petechial bleeding no abscess  OPERATIVE PROCEDURE: Patient was brought the operating room after undergoing a regional block.  After adequate levels anesthesia were obtained patient's left lower extremity was prepped using DuraPrep draped into a sterile field a timeout was called.  Elliptical incision was made around the fifth ray and the ulcerative tissue.  This left a wound that was 7 x 3 cm.  The fifth ray was resected through the base.  Electrocautery was used for hemostasis the wound was irrigated with normal saline.  Local tissue rearrangement was used to close the wound 3 x 7 cm.  The incision was closed using 2-0 nylon.  Sterile dressing was applied patient was taken the PACU in stable condition.   DISCHARGE PLANNING:  Antibiotic duration: Continue antibiotics for 24 hours  Weightbearing: Touchdown weightbearing on the left  Pain medication: Opioid pathway  Dressing care/ Wound VAC: Dry dressing change in 1 week  Ambulatory devices: Walker  Discharge to: Anticipate discharge to home.  Follow-up: In the office 1 week post  operative.

## 2019-04-06 NOTE — Anesthesia Postprocedure Evaluation (Signed)
Anesthesia Post Note  Patient: [name]  Procedure(s) Performed: LEFT 5TH RAY AMPUTATION (Left Foot)     Patient location during evaluation: PACU Anesthesia Type: Regional Level of consciousness: awake and alert and oriented Pain management: pain level controlled Vital Signs Assessment: post-procedure vital signs reviewed and stable Respiratory status: spontaneous breathing, nonlabored ventilation and respiratory function stable Cardiovascular status: blood pressure returned to baseline Postop Assessment: no apparent nausea or vomiting Anesthetic complications: no    Last Vitals:  Vitals:   04/06/19 1205 04/06/19 1329  BP: (!) 111/41 (!) 110/46  Pulse: 74 74  Resp: (!) 9 15  Temp:  (!) 36.1 C  SpO2: 100% 98%    Last Pain:  Vitals:   04/06/19 1329  TempSrc:   PainSc: 0-No pain                 Brennan Bailey

## 2019-04-06 NOTE — Progress Notes (Signed)
Orthopedic Tech Progress Note Patient Details:  [name] 12/16/1945 [id number]  Ortho Devices Type of Ortho Device: Postop shoe/boot Ortho Device/Splint Location: LLE Ortho Device/Splint Interventions: Ordered, Application   Post Interventions Patient Tolerated: Well Instructions Provided: Care of device, Adjustment of device   Janit Pagan 04/06/2019, 2:56 PM

## 2019-04-06 NOTE — Transfer of Care (Signed)
Immediate Anesthesia Transfer of Care Note  Patient: [name]  Procedure(s) Performed: LEFT 5TH RAY AMPUTATION (Left Foot)  Patient Location: PACU  Anesthesia Type:Regional  Level of Consciousness: awake, alert  and oriented  Airway & Oxygen Therapy: Patient Spontanous Breathing  Post-op Assessment: Report given to RN and Post -op Vital signs reviewed and stable  Post vital signs: Reviewed and stable  Last Vitals:  Vitals Value Taken Time  BP 110/46 04/06/19 1329  Temp    Pulse 74 04/06/19 1329  Resp 15 04/06/19 1329  SpO2 97 % 04/06/19 1329  Vitals shown include unvalidated device data.  Last Pain:  Vitals:   04/06/19 0741  TempSrc: Oral  PainSc:       Patients Stated Pain Goal: 0 (77/41/28 7867)  Complications: No apparent anesthesia complications

## 2019-04-06 NOTE — Progress Notes (Signed)
[facility] Progress Note   Subjective:  HD yesterday 1.5L UF. Seen in room, did well with PT this am.  No new complaints. To OR today for amp.   Objective Vitals:   04/06/19 0500 04/06/19 0506 04/06/19 0635 04/06/19 0741  BP:  (!) 124/57 (!) 130/58 (!) 143/66  Pulse:  85  85  Resp:  18  20  Temp:  98 F (36.7 C)  97.6 F (36.4 C)  TempSrc:  Oral  Oral  SpO2:  98%  96%  Weight: 75.4 kg     Height:        Physical Exam General: Elderly female, alert, nad  Heart: Regular Lungs: Clear bilaterally  Abdomen: soft non-tender  Extremities: Trace LE edema  Dialysis Access: RUE AVF +bruit    Weight change: 1.1 kg   Additional Objective Labs: Basic Metabolic Panel: Recent Labs  Lab 03/31/19 0715 04/03/19 0730 04/05/19 0724  NA 134* 133* 134*  K 4.0 3.9 4.2  CL 96* 94* 96*  CO2 23 25 21*  GLUCOSE 104* 146* 100*  BUN 38* 41* 45*  CREATININE 4.37* 5.19* 5.58*  CALCIUM 7.3* 7.3* 7.5*  PHOS 5.0* 4.7* 4.5   CBC: Recent Labs  Lab 03/31/19 0715 04/03/19 0729 04/05/19 0724  WBC 10.9* 8.7 7.1  HGB 7.3* 8.0* 8.0*  HCT 22.4* 25.4* 25.5*  MCV 96.6 99.2 99.6  PLT 221 246 221   Blood Culture    Component Value Date/Time   SDES BLOOD BLOOD LEFT FOREARM 03/23/2019 1538   SPECREQUEST  03/23/2019 1538    BOTTLES DRAWN AEROBIC AND ANAEROBIC Blood Culture results may not be optimal due to an excessive volume of blood received in culture bottles   CULT  03/23/2019 1538    NO GROWTH 5 DAYS Performed at [facility], [address] [place], [place] [address]    REPTSTATUS 03/28/2019 FINAL 03/23/2019 1538     Medications: . sodium chloride    .  ceFAZolin (ANCEF) IV    . ceFEPime (MAXIPIME) IV 1 g (04/05/19 2212)  . magnesium sulfate bolus IVPB     . allopurinol  100 mg Oral BID  . aspirin EC  81 mg Oral Daily  . atorvastatin  10 mg Oral q1800  . Chlorhexidine Gluconate Cloth  6 each Topical Q0600  . cinacalcet  30 mg Oral Q supper  .  darbepoetin (ARANESP) injection - DIALYSIS  60 mcg Intravenous Q Tue-HD  . doxercalciferol  4 mcg Intravenous Q T,Th,Sa-HD  . feeding supplement (NEPRO CARB STEADY)  237 mL Oral BID BM  . feeding supplement (PRO-STAT SUGAR FREE 64)  30 mL Oral BID  . hydrALAZINE  25 mg Oral Q8H  . insulin aspart  0-15 Units Subcutaneous TID WC  . insulin aspart  0-5 Units Subcutaneous QHS  . insulin detemir  10 Units Subcutaneous Daily  . isosorbide dinitrate  10 mg Oral TID  . multivitamin  1 tablet Oral QHS  . pantoprazole  40 mg Oral Daily  . polyethylene glycol  17 g Oral Daily  . sevelamer carbonate  1,600 mg Oral TID WC  . traZODone  25-100 mg Oral QHS    Dialysis Orders:  [facility], TTS, EDW 72 kg, BFR 350, DFR 800, Optiflux 180, 3K/2.25 calcium, vancomycin 750 mg q. HD, Mircera 50 mcg every 2 weeks, Hectorol 4 mcg IV 3 times weekly, heparin 5000 unit bolus. Right radiocephalic fistula.  Assessment/Plan: 1.  Left 5th metatarsal osteomyelitis/septic arthritis now s/p left  femoral endarterectomy (with prior left fem-pop bypass).   Antibiotics narrowed to IV cefepime. Plans for left 5th ray amp today.  2. ESRD - HD TTS. Next HD 12/31.  3. HTN/volume-  BP controlled. No gross volume overload on exam. Has been below EDW, weights variable.  UF 1-2L  as tolerated.  4. Anemia- Hgb low post-op. Aranesp 60 q Tues. Tsat 19%. Start Fe course when antibiotics complete/infection resolved.  5. Metabolic Bone Disease-  Corr Ca/Phos ok. Continue Renvela binder/VDRA.  6. Nutrition - Add prot supp for low albumin  Dispo:  To continue ongoing PT/OT  following admission to CIR or SNF.  Lynnda Child PA-C [facility] Pager [phone] 04/06/2019,10:24 AM  LOS: 14 days

## 2019-04-06 NOTE — Progress Notes (Signed)
Patient arrived and was d/c'd a/o x 4 , having no pain, VSS, laughing and joking with staff and eating ice.

## 2019-04-06 NOTE — Anesthesia Procedure Notes (Signed)
Anesthesia Regional Block: Ankle block   Pre-Anesthetic Checklist: ,, timeout performed, Correct Patient, Correct Site, Correct Laterality, Correct Procedure, Correct Position, site marked, Risks and benefits discussed, pre-op evaluation,  At surgeon's request and post-op pain management  Laterality: Left  Prep: Maximum Sterile Barrier Precautions used, chloraprep       Needles:  Injection technique: Single-shot  Needle Type: Echogenic Needle     Needle Length: 4cm  Needle Gauge: 25     Additional Needles:   Narrative:  Start time: 04/06/2019 11:21 AM End time: 04/06/2019 11:25 AM  Performed by: Personally  Anesthesiologist: Brennan Bailey, MD  Additional Notes: Risks, benefits, and alternative discussed. Patient gave consent for procedure. Patient prepped and draped in sterile fashion. Sedation administered, patient remains easily responsive to voice. Local anesthetic given in 5cc increments with no signs or symptoms of intravascular injection. No pain or paraesthesias with injection. Patient monitored throughout procedure with signs of LAST or immediate complications. Tolerated well.   Tawny Asal, MD

## 2019-04-07 ENCOUNTER — Inpatient Hospital Stay (HOSPITAL_COMMUNITY)
Admission: RE | Admit: 2019-04-07 | Discharge: 2019-04-20 | DRG: 945 | Disposition: A | Discharge status: Home Health | Payer: Medicare Other | Source: Intra-hospital | Attending: Physical Medicine & Rehabilitation | Admitting: Physical Medicine & Rehabilitation

## 2019-04-07 ENCOUNTER — Other Ambulatory Visit: Payer: Self-pay

## 2019-04-07 DIAGNOSIS — R5381 Other malaise: Principal | ICD-10-CM | POA: Diagnosis present

## 2019-04-07 DIAGNOSIS — M792 Neuralgia and neuritis, unspecified: Secondary | ICD-10-CM | POA: Diagnosis not present

## 2019-04-07 DIAGNOSIS — I25810 Atherosclerosis of coronary artery bypass graft(s) without angina pectoris: Secondary | ICD-10-CM

## 2019-04-07 DIAGNOSIS — M109 Gout, unspecified: Secondary | ICD-10-CM

## 2019-04-07 DIAGNOSIS — Z794 Long term (current) use of insulin: Secondary | ICD-10-CM

## 2019-04-07 DIAGNOSIS — Z95828 Presence of other vascular implants and grafts: Secondary | ICD-10-CM | POA: Diagnosis not present

## 2019-04-07 DIAGNOSIS — Z992 Dependence on renal dialysis: Secondary | ICD-10-CM

## 2019-04-07 DIAGNOSIS — Z6832 Body mass index (BMI) 32.0-32.9, adult: Secondary | ICD-10-CM

## 2019-04-07 DIAGNOSIS — M5430 Sciatica, unspecified side: Secondary | ICD-10-CM | POA: Diagnosis present

## 2019-04-07 DIAGNOSIS — I132 Hypertensive heart and chronic kidney disease with heart failure and with stage 5 chronic kidney disease, or end stage renal disease: Secondary | ICD-10-CM | POA: Diagnosis present

## 2019-04-07 DIAGNOSIS — Z91040 Latex allergy status: Secondary | ICD-10-CM

## 2019-04-07 DIAGNOSIS — E1169 Type 2 diabetes mellitus with other specified complication: Secondary | ICD-10-CM | POA: Diagnosis present

## 2019-04-07 DIAGNOSIS — E1149 Type 2 diabetes mellitus with other diabetic neurological complication: Secondary | ICD-10-CM | POA: Diagnosis present

## 2019-04-07 DIAGNOSIS — Z7982 Long term (current) use of aspirin: Secondary | ICD-10-CM

## 2019-04-07 DIAGNOSIS — L97218 Non-pressure chronic ulcer of right calf with other specified severity: Secondary | ICD-10-CM | POA: Diagnosis present

## 2019-04-07 DIAGNOSIS — K5900 Constipation, unspecified: Secondary | ICD-10-CM

## 2019-04-07 DIAGNOSIS — D649 Anemia, unspecified: Secondary | ICD-10-CM

## 2019-04-07 DIAGNOSIS — M868X7 Other osteomyelitis, ankle and foot: Secondary | ICD-10-CM | POA: Diagnosis present

## 2019-04-07 DIAGNOSIS — E1151 Type 2 diabetes mellitus with diabetic peripheral angiopathy without gangrene: Secondary | ICD-10-CM | POA: Diagnosis present

## 2019-04-07 DIAGNOSIS — K5903 Drug induced constipation: Secondary | ICD-10-CM

## 2019-04-07 DIAGNOSIS — I447 Left bundle-branch block, unspecified: Secondary | ICD-10-CM | POA: Diagnosis present

## 2019-04-07 DIAGNOSIS — M545 Low back pain, unspecified: Secondary | ICD-10-CM

## 2019-04-07 DIAGNOSIS — K649 Unspecified hemorrhoids: Secondary | ICD-10-CM | POA: Diagnosis present

## 2019-04-07 DIAGNOSIS — E785 Hyperlipidemia, unspecified: Secondary | ICD-10-CM | POA: Diagnosis present

## 2019-04-07 DIAGNOSIS — G5700 Lesion of sciatic nerve, unspecified lower limb: Secondary | ICD-10-CM | POA: Diagnosis present

## 2019-04-07 DIAGNOSIS — Z951 Presence of aortocoronary bypass graft: Secondary | ICD-10-CM | POA: Diagnosis not present

## 2019-04-07 DIAGNOSIS — Z923 Personal history of irradiation: Secondary | ICD-10-CM | POA: Diagnosis not present

## 2019-04-07 DIAGNOSIS — Z8249 Family history of ischemic heart disease and other diseases of the circulatory system: Secondary | ICD-10-CM

## 2019-04-07 DIAGNOSIS — E1142 Type 2 diabetes mellitus with diabetic polyneuropathy: Secondary | ICD-10-CM | POA: Diagnosis present

## 2019-04-07 DIAGNOSIS — E1122 Type 2 diabetes mellitus with diabetic chronic kidney disease: Secondary | ICD-10-CM | POA: Diagnosis present

## 2019-04-07 DIAGNOSIS — N186 End stage renal disease: Secondary | ICD-10-CM | POA: Diagnosis present

## 2019-04-07 DIAGNOSIS — I2510 Atherosclerotic heart disease of native coronary artery without angina pectoris: Secondary | ICD-10-CM | POA: Diagnosis present

## 2019-04-07 DIAGNOSIS — E1165 Type 2 diabetes mellitus with hyperglycemia: Secondary | ICD-10-CM | POA: Diagnosis not present

## 2019-04-07 DIAGNOSIS — Z888 Allergy status to other drugs, medicaments and biological substances status: Secondary | ICD-10-CM

## 2019-04-07 DIAGNOSIS — Z89421 Acquired absence of other right toe(s): Secondary | ICD-10-CM

## 2019-04-07 DIAGNOSIS — Z853 Personal history of malignant neoplasm of breast: Secondary | ICD-10-CM

## 2019-04-07 DIAGNOSIS — D631 Anemia in chronic kidney disease: Secondary | ICD-10-CM | POA: Diagnosis present

## 2019-04-07 DIAGNOSIS — Z79891 Long term (current) use of opiate analgesic: Secondary | ICD-10-CM

## 2019-04-07 DIAGNOSIS — I252 Old myocardial infarction: Secondary | ICD-10-CM

## 2019-04-07 DIAGNOSIS — Z91041 Radiographic dye allergy status: Secondary | ICD-10-CM

## 2019-04-07 DIAGNOSIS — Z808 Family history of malignant neoplasm of other organs or systems: Secondary | ICD-10-CM

## 2019-04-07 DIAGNOSIS — R7309 Other abnormal glucose: Secondary | ICD-10-CM | POA: Diagnosis not present

## 2019-04-07 DIAGNOSIS — M00272 Other streptococcal arthritis, left ankle and foot: Secondary | ICD-10-CM | POA: Diagnosis not present

## 2019-04-07 DIAGNOSIS — G8929 Other chronic pain: Secondary | ICD-10-CM

## 2019-04-07 DIAGNOSIS — Z8051 Family history of malignant neoplasm of kidney: Secondary | ICD-10-CM

## 2019-04-07 DIAGNOSIS — M5440 Lumbago with sciatica, unspecified side: Secondary | ICD-10-CM | POA: Diagnosis present

## 2019-04-07 DIAGNOSIS — E0822 Diabetes mellitus due to underlying condition with diabetic chronic kidney disease: Secondary | ICD-10-CM | POA: Diagnosis not present

## 2019-04-07 DIAGNOSIS — Z87891 Personal history of nicotine dependence: Secondary | ICD-10-CM

## 2019-04-07 DIAGNOSIS — E8889 Other specified metabolic disorders: Secondary | ICD-10-CM | POA: Diagnosis present

## 2019-04-07 DIAGNOSIS — I5042 Chronic combined systolic (congestive) and diastolic (congestive) heart failure: Secondary | ICD-10-CM | POA: Diagnosis present

## 2019-04-07 DIAGNOSIS — Z89422 Acquired absence of other left toe(s): Secondary | ICD-10-CM

## 2019-04-07 DIAGNOSIS — M009 Pyogenic arthritis, unspecified: Secondary | ICD-10-CM | POA: Diagnosis present

## 2019-04-07 DIAGNOSIS — I255 Ischemic cardiomyopathy: Secondary | ICD-10-CM | POA: Diagnosis present

## 2019-04-07 DIAGNOSIS — R159 Full incontinence of feces: Secondary | ICD-10-CM | POA: Diagnosis not present

## 2019-04-07 DIAGNOSIS — G4709 Other insomnia: Secondary | ICD-10-CM

## 2019-04-07 DIAGNOSIS — E1140 Type 2 diabetes mellitus with diabetic neuropathy, unspecified: Secondary | ICD-10-CM | POA: Diagnosis present

## 2019-04-07 DIAGNOSIS — E11622 Type 2 diabetes mellitus with other skin ulcer: Secondary | ICD-10-CM | POA: Diagnosis present

## 2019-04-07 DIAGNOSIS — E669 Obesity, unspecified: Secondary | ICD-10-CM | POA: Diagnosis present

## 2019-04-07 DIAGNOSIS — I10 Essential (primary) hypertension: Secondary | ICD-10-CM | POA: Diagnosis not present

## 2019-04-07 DIAGNOSIS — I739 Peripheral vascular disease, unspecified: Secondary | ICD-10-CM | POA: Diagnosis present

## 2019-04-07 DIAGNOSIS — Z803 Family history of malignant neoplasm of breast: Secondary | ICD-10-CM

## 2019-04-07 DIAGNOSIS — M86172 Other acute osteomyelitis, left ankle and foot: Secondary | ICD-10-CM | POA: Diagnosis not present

## 2019-04-07 DIAGNOSIS — Z79899 Other long term (current) drug therapy: Secondary | ICD-10-CM

## 2019-04-07 DIAGNOSIS — N2581 Secondary hyperparathyroidism of renal origin: Secondary | ICD-10-CM | POA: Diagnosis not present

## 2019-04-07 DIAGNOSIS — I120 Hypertensive chronic kidney disease with stage 5 chronic kidney disease or end stage renal disease: Secondary | ICD-10-CM | POA: Diagnosis not present

## 2019-04-07 LAB — GLUCOSE, CAPILLARY
Glucose-Capillary: 144 mg/dL — ABNORMAL HIGH (ref 70–99)
Glucose-Capillary: 146 mg/dL — ABNORMAL HIGH (ref 70–99)
Glucose-Capillary: 175 mg/dL — ABNORMAL HIGH (ref 70–99)
Glucose-Capillary: 96 mg/dL (ref 70–99)
Glucose-Capillary: 92 mg/dL (ref 70–99)

## 2019-04-07 LAB — COMPREHENSIVE METABOLIC PANEL
ALT: 8 U/L (ref 0–44)
AST: 11 U/L — ABNORMAL LOW (ref 15–41)
Albumin: 2.4 g/dL — ABNORMAL LOW (ref 3.5–5.0)
Alkaline Phosphatase: 86 U/L (ref 38–126)
Anion gap: 13 (ref 5–15)
BUN: 40 mg/dL — ABNORMAL HIGH (ref 8–23)
CO2: 24 mmol/L (ref 22–32)
Calcium: 7.5 mg/dL — ABNORMAL LOW (ref 8.9–10.3)
Chloride: 98 mmol/L (ref 98–111)
Creatinine, Ser: 4.64 mg/dL — ABNORMAL HIGH (ref 0.44–1.00)
GFR calc Af Amer: 10 mL/min — ABNORMAL LOW (ref >60)
GFR calc non Af Amer: 9 mL/min — ABNORMAL LOW (ref >60)
Glucose, Bld: 172 mg/dL — ABNORMAL HIGH (ref 70–99)
Potassium: 4.4 mmol/L (ref 3.5–5.1)
Sodium: 135 mmol/L (ref 135–145)
Total Bilirubin: 0.3 mg/dL (ref 0.3–1.2)
Total Protein: 5.2 g/dL — ABNORMAL LOW (ref 6.5–8.1)

## 2019-04-07 LAB — MAGNESIUM: Magnesium: 2.4 mg/dL (ref 1.7–2.4)

## 2019-04-07 LAB — CBC
HCT: 27.1 % — ABNORMAL LOW (ref 36.0–46.0)
Hemoglobin: 8.5 g/dL — ABNORMAL LOW (ref 12.0–15.0)
MCH: 31.6 pg (ref 26.0–34.0)
MCHC: 31.4 g/dL (ref 30.0–36.0)
MCV: 100.7 fL — ABNORMAL HIGH (ref 80.0–100.0)
Platelets: 224 10*3/uL (ref 150–400)
RBC: 2.69 MIL/uL — ABNORMAL LOW (ref 3.87–5.11)
RDW: 18.6 % — ABNORMAL HIGH (ref 11.5–15.5)
WBC: 11.6 10*3/uL — ABNORMAL HIGH (ref 4.0–10.5)
nRBC: 0 % (ref 0.0–0.2)

## 2019-04-07 MED ORDER — PRO-STAT SUGAR FREE PO LIQD: 30.0000 mL | Freq: Two times a day (BID) | ORAL | 0 refills | Status: DC

## 2019-04-07 MED ORDER — ALTEPLASE 2 MG IJ SOLR: 2.0000 mg | Freq: Once | INTRAMUSCULAR | Status: DC | PRN

## 2019-04-07 MED ORDER — ACETAMINOPHEN 650 MG RE SUPP: 325.0000 mg | RECTAL | Status: DC | PRN

## 2019-04-07 MED ORDER — SORBITOL 70 % SOLN
30.0000 mL | Freq: Every day | Status: DC | PRN
  Administered 2019-04-08: 30 mL via ORAL
  Filled 2019-04-07: qty 30

## 2019-04-07 MED ORDER — PRO-STAT SUGAR FREE PO LIQD
30.0000 mL | Freq: Two times a day (BID) | ORAL | Status: DC
  Administered 2019-04-07 – 2019-04-20 (×20): 30 mL via ORAL
  Filled 2019-04-07 (×24): qty 30

## 2019-04-07 MED ORDER — ALLOPURINOL 100 MG PO TABS
100.0000 mg | ORAL_TABLET | Freq: Two times a day (BID) | ORAL | Status: DC
  Administered 2019-04-07 – 2019-04-20 (×25): 100 mg via ORAL
  Filled 2019-04-07 (×24): qty 1

## 2019-04-07 MED ORDER — RENA-VITE PO TABS
1.0000 | ORAL_TABLET | Freq: Every day | ORAL | Status: DC
  Administered 2019-04-07 – 2019-04-19 (×13): 1 via ORAL
  Filled 2019-04-07 (×13): qty 1

## 2019-04-07 MED ORDER — DARBEPOETIN ALFA 100 MCG/0.5ML IJ SOSY: 100.0000 ug | PREFILLED_SYRINGE | INTRAMUSCULAR | Status: DC

## 2019-04-07 MED ORDER — SEVELAMER CARBONATE 800 MG PO TABS
1600.0000 mg | ORAL_TABLET | Freq: Three times a day (TID) | ORAL | Status: DC
  Administered 2019-04-07 – 2019-04-20 (×32): 1600 mg via ORAL
  Filled 2019-04-07 (×35): qty 2

## 2019-04-07 MED ORDER — LIDOCAINE-PRILOCAINE 2.5-2.5 % EX CREA: 1.0000 "application " | TOPICAL_CREAM | CUTANEOUS | Status: DC | PRN

## 2019-04-07 MED ORDER — POLYETHYLENE GLYCOL 3350 17 G PO PACK: 17.0000 g | PACK | Freq: Every day | ORAL | Status: DC

## 2019-04-07 MED ORDER — ATORVASTATIN CALCIUM 10 MG PO TABS
10.0000 mg | ORAL_TABLET | Freq: Every day | ORAL | Status: DC
  Administered 2019-04-07 – 2019-04-19 (×12): 10 mg via ORAL
  Filled 2019-04-07 (×13): qty 1

## 2019-04-07 MED ORDER — INSULIN DETEMIR 100 UNIT/ML ~~LOC~~ SOLN
10.0000 [IU] | Freq: Every day | SUBCUTANEOUS | Status: DC
  Administered 2019-04-08 – 2019-04-16 (×9): 10 [IU] via SUBCUTANEOUS
  Filled 2019-04-07 (×11): qty 0.1

## 2019-04-07 MED ORDER — CINACALCET HCL 30 MG PO TABS
30.0000 mg | ORAL_TABLET | Freq: Every day | ORAL | Status: DC
  Administered 2019-04-07 – 2019-04-19 (×11): 30 mg via ORAL
  Filled 2019-04-07 (×12): qty 1

## 2019-04-07 MED ORDER — SODIUM CHLORIDE 0.9 % IV SOLN: 100.0000 mL | INTRAVENOUS | Status: DC | PRN

## 2019-04-07 MED ORDER — NEPRO/CARBSTEADY PO LIQD
237.0000 mL | Freq: Two times a day (BID) | ORAL | Status: DC
  Administered 2019-04-07 – 2019-04-20 (×11): 237 mL via ORAL

## 2019-04-07 MED ORDER — ASPIRIN EC 81 MG PO TBEC
81.0000 mg | DELAYED_RELEASE_TABLET | Freq: Every day | ORAL | Status: DC
  Administered 2019-04-07 – 2019-04-20 (×13): 81 mg via ORAL
  Filled 2019-04-07 (×13): qty 1

## 2019-04-07 MED ORDER — ACETAMINOPHEN 325 MG PO TABS
325.0000 mg | ORAL_TABLET | ORAL | Status: DC | PRN
  Administered 2019-04-10 – 2019-04-19 (×7): 650 mg via ORAL
  Filled 2019-04-07 (×7): qty 2

## 2019-04-07 MED ORDER — CAMPHOR-MENTHOL 0.5-0.5 % EX LOTN
TOPICAL_LOTION | CUTANEOUS | Status: DC | PRN
  Filled 2019-04-07: qty 222

## 2019-04-07 MED ORDER — HEPARIN SODIUM (PORCINE) 1000 UNIT/ML DIALYSIS: 1000.0000 [IU] | INTRAMUSCULAR | Status: DC | PRN

## 2019-04-07 MED ORDER — DOXERCALCIFEROL 4 MCG/2ML IV SOLN
4.0000 ug | INTRAVENOUS | Status: DC
  Administered 2019-04-12: 4 ug via INTRAVENOUS
  Filled 2019-04-07 (×5): qty 2

## 2019-04-07 MED ORDER — DOCUSATE SODIUM 100 MG PO CAPS
100.0000 mg | ORAL_CAPSULE | Freq: Two times a day (BID) | ORAL | Status: DC
  Administered 2019-04-07 – 2019-04-11 (×8): 100 mg via ORAL
  Filled 2019-04-07 (×8): qty 1

## 2019-04-07 MED ORDER — PANTOPRAZOLE SODIUM 40 MG PO TBEC
40.0000 mg | DELAYED_RELEASE_TABLET | Freq: Every day | ORAL | Status: DC
  Administered 2019-04-07 – 2019-04-20 (×13): 40 mg via ORAL
  Filled 2019-04-07 (×13): qty 1

## 2019-04-07 MED ORDER — LIDOCAINE HCL (PF) 1 % IJ SOLN: 5.0000 mL | INTRAMUSCULAR | Status: DC | PRN

## 2019-04-07 MED ORDER — TRAZODONE HCL 50 MG PO TABS
25.0000 mg | ORAL_TABLET | Freq: Every day | ORAL | Status: DC
  Administered 2019-04-08 – 2019-04-13 (×6): 50 mg via ORAL
  Administered 2019-04-14: 21:00:00 100 mg via ORAL
  Administered 2019-04-15 – 2019-04-18 (×4): 50 mg via ORAL
  Administered 2019-04-19: 100 mg via ORAL
  Filled 2019-04-07 (×5): qty 1
  Filled 2019-04-07: qty 2
  Filled 2019-04-07 (×3): qty 1
  Filled 2019-04-07: qty 2
  Filled 2019-04-07 (×3): qty 1

## 2019-04-07 MED ORDER — HYDRALAZINE HCL 25 MG PO TABS: 25.0000 mg | ORAL_TABLET | Freq: Three times a day (TID) | ORAL | Status: DC

## 2019-04-07 MED ORDER — PENTAFLUOROPROP-TETRAFLUOROETH EX AERO: 1.0000 "application " | INHALATION_SPRAY | CUTANEOUS | Status: DC | PRN

## 2019-04-07 MED ORDER — HYDRALAZINE HCL 25 MG PO TABS
25.0000 mg | ORAL_TABLET | Freq: Three times a day (TID) | ORAL | Status: DC
  Administered 2019-04-07 – 2019-04-20 (×36): 25 mg via ORAL
  Filled 2019-04-07 (×37): qty 1

## 2019-04-07 MED ORDER — CYCLOBENZAPRINE HCL 5 MG PO TABS
5.0000 mg | ORAL_TABLET | Freq: Three times a day (TID) | ORAL | Status: DC | PRN
  Administered 2019-04-07 – 2019-04-19 (×9): 5 mg via ORAL
  Filled 2019-04-07 (×12): qty 1

## 2019-04-07 MED ORDER — SEVELAMER CARBONATE 800 MG PO TABS
800.0000 mg | ORAL_TABLET | Freq: Two times a day (BID) | ORAL | Status: DC | PRN
  Administered 2019-04-20: 07:00:00 800 mg via ORAL
  Filled 2019-04-07: qty 1

## 2019-04-07 MED ORDER — INSULIN ASPART 100 UNIT/ML ~~LOC~~ SOLN
0.0000 [IU] | Freq: Three times a day (TID) | SUBCUTANEOUS | Status: DC
  Administered 2019-04-08: 3 [IU] via SUBCUTANEOUS
  Administered 2019-04-09 – 2019-04-15 (×10): 2 [IU] via SUBCUTANEOUS
  Administered 2019-04-16: 3 [IU] via SUBCUTANEOUS
  Administered 2019-04-17: 2 [IU] via SUBCUTANEOUS
  Administered 2019-04-17: 5 [IU] via SUBCUTANEOUS
  Administered 2019-04-18 (×2): 2 [IU] via SUBCUTANEOUS

## 2019-04-07 MED ORDER — POLYETHYLENE GLYCOL 3350 17 G PO PACK
17.0000 g | PACK | Freq: Every day | ORAL | Status: DC
  Administered 2019-04-08: 17 g via ORAL
  Filled 2019-04-07: qty 1

## 2019-04-07 MED ORDER — HYDROCODONE-ACETAMINOPHEN 5-325 MG PO TABS
0.5000 | ORAL_TABLET | Freq: Four times a day (QID) | ORAL | Status: DC | PRN
  Administered 2019-04-08 – 2019-04-20 (×30): 1 via ORAL
  Filled 2019-04-07 (×33): qty 1

## 2019-04-07 MED ORDER — SENNOSIDES-DOCUSATE SODIUM 8.6-50 MG PO TABS: 1.0000 | ORAL_TABLET | Freq: Every evening | ORAL | Status: DC | PRN

## 2019-04-07 MED ORDER — ISOSORBIDE DINITRATE 10 MG PO TABS
10.0000 mg | ORAL_TABLET | Freq: Three times a day (TID) | ORAL | Status: DC
  Administered 2019-04-07 – 2019-04-20 (×34): 10 mg via ORAL
  Filled 2019-04-07 (×41): qty 1

## 2019-04-07 NOTE — Progress Notes (Signed)
Pamela Heys, MD  Physician  Physical Medicine and Rehabilitation  PMR Pre-admission  Signed  Date of Service:  04/05/2019  1:20 PM      Related encounter: ED to Hosp-Admission (Discharged) from 03/23/2019 in [facility] 4E CV [place]      Signed        PMR Admission Coordinator Pre-Admission Assessment   Patient: [name] is an 73 y.o., female MRN: [id number] DOB: 1945-04-22 Height: '5\' 4"'  (162.6 cm) Weight: 75.4 kg(pt refused to let me take her massager out and weigh her.)   Insurance Information HMO:     PPO:      PCP:      IPA:      80/20: yes     OTHER:  PRIMARY: Medicare part A and B      Policy#: [id number]      Subscriber: Patient CM Name:       Phone#:      Fax#:  Pre-Cert#:       Employer:  Benefits:  Phone #: online     Name: verified online via [place] on 04/07/2019 Eff. Date: Part A and B effective 12/07/2010     Deduct: $1,408      Out of Pocket Max: NA      Life Max: NA CIR: Covered per Medicare guidelines once yearly deductible has been met.      SNF: days 1-20, 100%, days 21-100, 80% Outpatient: 80%     Co-Pay: 20% Home Health: 100%      Co-Pay:  DME: 80%     Co-Pay: 20% Providers: pt's choice.    *2021 yearly deductible expected to be $1,484.   SECONDARY: BCBS      Policy#: [id number]      Subscriber: Patient CM Name:       Phone#:      Fax#:  Pre-Cert#:       Employer:  Benefits:  Phone #: [phone]     Name:  Eff. Date:      Deduct:       Out of Pocket Max:       Life Max:  CIR:       SNF:  Outpatient:      Co-Pay:  Home Health:       Co-Pay:  DME:      Co-Pay:    Medicaid Application Date:       Case Manager:  Disability Application Date:       Case Worker:    The "Data Collection Information Summary" for patients in Inpatient Rehabilitation Facilities with attached "Privacy Act [place] Records" was provided and verbally reviewed with: Patient   Emergency Contact Information         Contact  Information     Name Relation Home Work Mobile    [name] [phone]   [phone]    Pamela, [name] [phone]   [phone]         Current Medical History  Patient Admitting Diagnosis: s/p L CFA endarterectomy and femoral to below-knee popliteal bypass AND left 5th ray amputation.    History of Present Illness: [name] is a 73 year old female with history of CAD with CABG, chronic combined congestive heart failure/ICM, end-stage renal disease with hemodialysis,, hypertension, peripheral vascular disease as well as remote history of tobacco abuse.  Presented 03/23/2019 with left gangrenous open wound to foot and has been followed with outpatient antibiotics.  Ultimately patient known bilateral SFA occlusions monophasic runoff of the ankle for  vascular studies earlier in the year.  Underwent left common femoral enterectomy and Dacron patch angioplasty as well as left femoral to below-knee popliteal bypass 03/28/2019 per Dr. Donnetta Hutching.  Hospital course pain management MRI of left foot showed septic arthritis of the fifth MTP joint with osteomyelitis involving the distal half of the fifth metatarsal and also the proximal phalanx.  Orthopedic service was consulted in regards to findings of MRI of left foot Dr. Sharol Given and underwent left fifth ray amputation 04/06/2019.  Renal service continues to follow for history of CKD stage IV with hemodialysis as directed.   Therapy evaluations completed and patient is to be admitted for a comprehensive rehab program on 04/07/2019.   Patient's medical record from [facility] has been reviewed by the rehabilitation admission coordinator and physician.   Past Medical History      Past Medical History:  Diagnosis Date  . Allergy    . Arthritis      "maybe in my lower back" (08/27/2012)  . 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 Chronic combined systolic and diastolic CHF  (congestive heart failure) ([place])    . CKD (chronic kidney disease), stage IV ([place])    . Coronary artery disease      a. NSTEMI in 05/2009 s/p CABG (LIMA-LAD, SVG-diagonal, SVG-OM1/OM 2).   Marland Kitchen Dyspnea      with exertion  . Family history of anesthesia complication      Mom has a hard time to wake up  . Foot ulcer ([place])      "I've had them on both feet" (08/27/2012)  . Gouty arthritis      "right index finger" (08/27/2012)  . History of radiation therapy 06/09/13-07/06/13    left breast 50Gy  . Hyperlipidemia    . Hypertension    . Infection      right second toe  . Ischemic cardiomyopathy      a. 2011: EF 40-45%. b. EF 55-60% in 04/2017 but shortly after as inpatient was 45-50%.  Marland Kitchen LBBB (left bundle branch block)    . Mitral regurgitation    . Neuropathy      Hx; of B/L feet  . NSTEMI (non-ST elevated myocardial infarction) ([place]) 05/23/2009  . Osteomyelitis of foot ([place])    . PAD (peripheral artery disease) ([facility])      a. LE PAD (patient previously elected hold off L fem-pop), prev followed by Dr. Bridgett Larsson  . PONV (postoperative nausea and vomiting)    . Sinus headache      "occasionally" (08/27/2012)  . Type II diabetes mellitus ([place])    . Vitamin D deficiency        Family History   family history includes Breast cancer in her maternal aunt; Breast cancer (age of onset: 23) in her sister; Breast cancer (age of onset: 44) in her sister; Hypertension in her brother; Kidney cancer (age of onset: 33) in her brother; Throat cancer (age of onset: 60) in her father.   Prior Rehab/Hospitalizations Has the patient had prior rehab or hospitalizations prior to admission? Yes   Has the patient had major surgery during 100 days prior to admission? Yes              Current Medications   Current Facility-Administered Medications:  .  0.9 %  sodium chloride infusion, 500 mL, Intravenous, Once PRN, Persons, Bevely Palmer, PA .  0.9 %  sodium chloride infusion, , Intravenous, Continuous, Persons,  Bevely Palmer, [place], Last Rate: 250 mL/hr at 04/06/19 1317, Rate Change at 04/06/19 1317 .  acetaminophen (TYLENOL) tablet 325-650 mg, 325-650 mg, Oral, Q4H PRN **OR** acetaminophen (TYLENOL) suppository 325-650 mg, 325-650 mg, Rectal, Q4H PRN, Persons, Bevely Palmer, PA .  allopurinol (ZYLOPRIM) tablet 100 mg, 100 mg, Oral, BID, Persons, Bevely Palmer, PA, 100 mg at 04/06/19 2220 .  aspirin EC tablet 81 mg, 81 mg, Oral, Daily, Persons, Bevely Palmer, [place], 81 mg at 04/06/19 2458 .  atorvastatin (LIPITOR) tablet 10 mg, 10 mg, Oral, q1800, Persons, Bevely Palmer, PA, 10 mg at 04/06/19 1704 .  camphor-menthol (SARNA) lotion, , Topical, PRN, Persons, Bevely Palmer, [place] .  ceFEPIme (MAXIPIME) 1 g in sodium chloride 0.9 % 100 mL IVPB, 1 g, Intravenous, Q24H, Pokhrel, [place], MD, Last Rate: 200 mL/hr at 04/06/19 2228, 1 g at 04/06/19 2228 .  Chlorhexidine Gluconate Cloth 2 % PADS 6 each, 6 each, Topical, Q0600, Persons, Bevely Palmer, [place], 6 each at 04/06/19 [phone] .  cinacalcet (SENSIPAR) tablet 30 mg, 30 mg, Oral, Q supper, Persons, Bevely Palmer, PA, 30 mg at 04/06/19 1704 .  cyclobenzaprine (FLEXERIL) tablet 5 mg, 5 mg, Oral, TID PRN, Persons, Bevely Palmer, PA, 5 mg at 03/30/19 1226 .  Darbepoetin Alfa (ARANESP) injection 60 mcg, 60 mcg, Intravenous, Q Tue-HD, Persons, Bevely Palmer, [place], 60 mcg at 04/05/19 1125 .  docusate sodium (COLACE) capsule 100 mg, 100 mg, Oral, BID, Persons, Bevely Palmer, PA, 100 mg at 04/06/19 1706 .  doxercalciferol (HECTOROL) injection 4 mcg, 4 mcg, Intravenous, Q T,Th,Sa-HD, Persons, Bevely Palmer, [place], 4 mcg at 04/05/19 1126 .  feeding supplement (NEPRO CARB STEADY) liquid 237 mL, 237 mL, Oral, BID BM, Persons, Bevely Palmer, [place], Last Rate: 0 mL/hr at 04/03/19 2300, 237 mL at 04/07/19 0935 .  feeding supplement (PRO-STAT SUGAR FREE 64) liquid 30 mL, 30 mL, Oral, BID, Persons, Bevely Palmer, PA, 30 mL at 04/07/19 0935 .  hydrALAZINE (APRESOLINE) tablet 25 mg, 25 mg, Oral, Q8H, Persons, Bevely Palmer, [place], 25 mg at 04/07/19 [phone] .  hydrALAZINE  (APRESOLINE) tablet 25 mg, 25 mg, Oral, Q6H PRN, Persons, Bevely Palmer, [place] .  HYDROcodone-acetaminophen (NORCO/VICODIN) 5-325 MG per tablet 0.5-1 tablet, 0.5-1 tablet, Oral, Q6H PRN, Persons, Bevely Palmer, [place], 1 tablet at 04/06/19 [phone] .  HYDROmorphone (DILAUDID) injection 0.5 mg, 0.5 mg, Intravenous, Q3H PRN, Persons, Bevely Palmer, PA, 0.5 mg at 03/31/19 9767 .  insulin aspart (novoLOG) injection 0-15 Units, 0-15 Units, Subcutaneous, TID WC, Persons, Bevely Palmer, [place], 2 Units at 04/07/19 [phone] .  insulin aspart (novoLOG) injection 0-5 Units, 0-5 Units, Subcutaneous, QHS, Persons, Bevely Palmer, [place], 3 Units at 03/25/19 2254 .  insulin detemir (LEVEMIR) injection 10 Units, 10 Units, Subcutaneous, Daily, Persons, Bevely Palmer, [place], 10 Units at 04/06/19 1421 .  isosorbide dinitrate (ISORDIL) tablet 10 mg, 10 mg, Oral, TID, Persons, Bevely Palmer, PA, 10 mg at 04/06/19 2220 .  magnesium sulfate IVPB 2 g 50 mL, 2 g, Intravenous, Daily PRN, Persons, Bevely Palmer, PA .  multivitamin (RENA-VIT) tablet 1 tablet, 1 tablet, Oral, QHS, Persons, Bevely Palmer, [place], 1 tablet at 04/06/19 2221 .  ondansetron (ZOFRAN) injection 4 mg, 4 mg, Intravenous, Q6H PRN, Persons, Bevely Palmer, PA .  pantoprazole (PROTONIX) EC tablet 40 mg, 40 mg, Oral, Daily, Persons, Bevely Palmer, [place], 40 mg at 04/06/19 0825 .  phenol (CHLORASEPTIC) mouth spray 1 spray, 1 spray, Mouth/Throat, PRN, Persons, Bevely Palmer, PA .  polyethylene glycol (MIRALAX / GLYCOLAX) packet 17 g,  17 g, Oral, Daily, Persons, Bevely Palmer, [place], 17 g at 04/07/19 0935 .  senna-docusate (Senokot-S) tablet 1 tablet, 1 tablet, Oral, QHS PRN, Persons, Bevely Palmer, PA .  sevelamer carbonate (RENVELA) tablet 1,600 mg, 1,600 mg, Oral, TID WC, Persons, Bevely Palmer, PA, 1,600 mg at 04/07/19 0934 .  sevelamer carbonate (RENVELA) tablet 800 mg, 800 mg, Oral, BID PRN, Persons, Bevely Palmer, PA, 800 mg at 03/24/19 2248 .  traZODone (DESYREL) tablet 25-100 mg, 25-100 mg, Oral, QHS, Persons, Bevely Palmer, [place], 100 mg at 04/06/19 2221    Patients Current Diet:     Diet Order                      Diet Carb Modified           Diet renal/carb modified with fluid restriction Diet-HS Snack? Nothing; Fluid restriction: 1200 mL Fluid; Room service appropriate? Yes; Fluid consistency: Thin  Diet effective now                   Precautions / Restrictions Precautions Precautions: Fall Restrictions Weight Bearing Restrictions: No LLE Weight Bearing: Touchdown weight bearing Other Position/Activity Restrictions: TDWB through heel per ortho note    Has the patient had 2 or more falls or a fall with injury in the past year? No   Prior Activity Level Limited Community (1-2x/wk): would mostly only get out of the house during HD appointments and other MD appointments   Prior Functional Level Self Care: Did the patient need help bathing, dressing, using the toilet or eating? Independent   Indoor Mobility: Did the patient need assistance with walking from room to room (with or without device)? Independent   Stairs: Did the patient need assistance with internal or external stairs (with or without device)? Needed some help   Functional Cognition: Did the patient need help planning regular tasks such as shopping or remembering to take medications? [place] / [place] Devices/Equipment: Other (Comment)("secretarial chair" per pt) Home Equipment: Walker - 2 wheels, Transport chair, Wheelchair - manual, Grab bars - toilet   Prior Device Use: Indicate devices/aids used by the patient prior to current illness, exacerbation or injury? None of the above   Current Functional Level Cognition   Overall Cognitive Status: Within Functional Limits for tasks assessed Current Attention Level: Selective Orientation Level: Oriented X4 General Comments: tangential     Extremity Assessment (includes Sensation/Coordination)   Upper Extremity Assessment: Generalized weakness  Lower Extremity  Assessment: Generalized weakness, LLE deficits/detail LLE Deficits / Details: L knee flex limited by pain, knee ext limited by ace wrap and pain; hip flex at least 3/5 LLE: Unable to fully assess due to pain LLE Sensation: decreased light touch LLE Coordination: decreased gross motor     ADLs   Overall ADL's : Needs assistance/impaired Grooming: Wash/dry hands, Wash/dry face, Oral care, Standing, Supervision/safety Grooming Details (indicate cue type and reason): supervision for safety Upper Body Bathing: Min guard, Sitting Lower Body Bathing: Maximal assistance, Sitting/lateral leans Upper Body Dressing : Min guard, Sitting Lower Body Dressing: Maximal assistance, Total assistance, Sitting/lateral leans Lower Body Dressing Details (indicate cue type and reason): unable to don sock to L foot seated EOB Toilet Transfer: Min guard, Minimal assistance, Grab bars, Regular Toilet, RW, Ambulation, Cueing for safety Toilet Transfer Details (indicate cue type and reason): min guard for safety; MINA  for sit>stand from low toilet seat, heavy use of grab bars noted Toileting- Clothing Manipulation  and Hygiene: Total assistance, Sit to/from stand Toileting - Clothing Manipulation Details (indicate cue type and reason): total A for posterior pericare for cleanliness Functional mobility during ADLs: Min guard, Rolling walker General ADL Comments: session focus on standing grooming tasks, toilet transfer and hygiene     Mobility   Overal bed mobility: Needs Assistance Bed Mobility: Supine to Sit, Sit to Supine, Rolling Rolling: Min assist Supine to sit: Min assist Sit to supine: Min assist General bed mobility comments: For supine sit: assist with trunk; For sit to supine: assist for leg;  Performed multiple rolls with min A and cues for technique; Performed scooting up in bed: with min A and cues initially and then mod A x 2; Performed scooting laterally in bed with mod A and cues for technique  (moving shoulders, hips, then legs)     Transfers   Overall transfer level: Needs assistance Equipment used: Rolling walker (2 wheeled) Transfers: Sit to/from Stand, [employer] Transfers Sit to Stand: Min assist Stand pivot transfers: Mod assist General transfer comment: cues for hand placement, min A to power up, cues for TDWB, performed sit to stand x 2 and stand pivot x 1     Ambulation / Gait / Stairs / Wheelchair Mobility   Ambulation/Gait Ambulation/Gait assistance: Mod assist Gait Distance (Feet): 5 Feet Assistive device: Rolling walker (2 wheeled) Gait Pattern/deviations: Step-to pattern, Decreased stride length General Gait Details: fatigued easily; cues for TDWB; had difficulty with TDWB so required mod A to help maintain Gait velocity: Decreased     Posture / Balance Dynamic Sitting Balance Sitting balance - Comments: cues for UE placement to maximize sitting balance Balance Overall balance assessment: Needs assistance Sitting-balance support: Feet supported, No upper extremity supported Sitting balance-Leahy Scale: Good Sitting balance - Comments: cues for UE placement to maximize sitting balance Postural control: Posterior lean Standing balance support: Bilateral upper extremity supported, During functional activity Standing balance-Leahy Scale: Fair Standing balance comment: supervision for safety, but able to complete standing grooming tasks with no LOB     Special needs/care consideration BiPAP/CPAP : no CPM : no Continuous Drip IV : 0.9% sodium chloride infusion Dialysis : yes    Days : T/Th/Sat Life Vest : no Oxygen: on RA Special Bed : no Trach Size : no Wound Vac (area) : no      Location : no Skin : abrasion to bilateral Legs, cellulitis to bilateral foot/legs, ecchymosis to arm, hand (right, left), weeping of RLE; closed incision to left groin, closed incision to left leg, diabetic left and right foot ulcer, diabetic proximal left leg ulcer, diabetic  ulcer right lower proximal leg.                      Bowel mgmt: last BM 04/01/2019 Bladder mgmt: oliguria  Diabetic mgmt: yes Behavioral consideration : no Chemo/radiation : not currently    Previous Home Environment (from acute therapy documentation) Living Arrangements: [name]/significant other Available Help at Discharge: Family, Available 24 hours/day Type of Home: House Home Layout: Two level, Able to live on main level with bedroom/bathroom Home Access: Stairs to enter Entrance Stairs-Number of Steps: 1 onto porch then 1 into main house, 2 steps into kitchen Bathroom Shower/Tub: Multimedia programmer: Handicapped height [place]: No Additional Comments: Husband currently recovering from [place] and using walker. Son visiting from out of town to provide assist as needed   Discharge Living Setting Plans for Discharge Living Setting: Patient's home, Lives with (  comment)(pt's husband) Type of Home at Discharge: House Discharge Home Layout: One level(has full basement with stair lift) Discharge Home Access: Stairs to enter Entrance Stairs-Rails: Right(when going through kitchen entrance) Entrance Stairs-Number of Steps: 2 Discharge Bathroom Shower/Tub: Other (comment)(only sponge bathes due to foot issues ) Discharge Bathroom Toilet: Handicapped height Discharge Bathroom Accessibility: Yes How Accessible: Accessible via walker Does the patient have any problems obtaining your medications?: No   Social/Family/Support Systems Patient Roles: [name] Contact Information: [name] [name]): [phone] Anticipated Caregiver: [name] Anticipated Caregiver's Contact Information: see above Ability/Limitations of Caregiver: supervision - pt's husband recently has hip surgery (12/18) Caregiver Availability: 24/7 Discharge Plan Discussed with Primary Caregiver: Yes Is Caregiver In Agreement with Plan?: Yes Does Caregiver/Family have Issues with Lodging/Transportation while Pt is  in Rehab?: No   Goals/Additional Needs Patient/Family Goal for Rehab: PT/OT: Mod I; SLP: NA Expected length of stay: 5-7 days Cultural Considerations: NA Dietary Needs: renal/carb modified with fluid restriction. Thin liquids. Fluid restriction 1200 mL  Equipment Needs: TBD Special Service Needs: HD T/Th/Sat Pt/Family Agrees to Admission and willing to participate: Yes Program Orientation Provided & Reviewed with Pt/Caregiver Including Roles  & Responsibilities: Yes(pt and her husband)  Barriers to Discharge: Home environment access/layout, Lack of/limited family support, Hemodialysis  Barriers to Discharge Comments: two steps to enter; needs to be supervision to go home.    Decrease burden of Care through IP rehab admission:  None   Possible need for SNF placement upon discharge: Not anticipated. Pt has Supervision support from her husband at DC and anticipate pt can achieve that functional level through CIR stay.   Patient Condition: This patient's medical and functional status has changed since the consult dated: 04/02/2019 in which the Rehabilitation Physician determined and documented that the patient's condition is appropriate for intensive rehabilitative care in an inpatient rehabilitation facility. See "History of Present Illness" (above) for medical update. Functional changes are: improvement in functional transfers from Max A to Min/Mod A and improvement in gait from Mod A for 1 foot to Mod A for 5 feet. Patient's medical and functional status update has been discussed with the Rehabilitation physician and patient remains appropriate for inpatient rehabilitation. Will admit to inpatient rehab today.   Preadmission Screen Completed By:  Raechel Ache, 04/07/2019 11:59 AM ______________________________________________________________________   Discussed status with Dr. Dagoberto Ligas on 04/07/2019 at 11:59AM and received approval for admission today.   Admission Coordinator:  Raechel Ache, OT,  time 11:59AM/Date 04/07/2019    Assessment/Plan: Diagnosis: 1. Does the need for close, 24 hr/day Medical supervision in concert with the patient's rehab needs make it unreasonable for this patient to be served in a less intensive setting? Yes 2. Co-Morbidities requiring supervision/potential complications: L fem-pop bypass, CHF- combined, CAD s/p CABG, ESRD on HD, PVD 3. Due to bowel management, safety, skin/wound care, disease management, medication administration, pain management and patient education, does the patient require 24 hr/day rehab nursing? Yes 4. Does the patient require coordinated care of a physician, rehab nurse, PT, OT, and SLP to address physical and functional deficits in the context of the above medical diagnosis(es)? Yes Addressing deficits in the following areas: balance, endurance, locomotion, strength, transferring, bathing, dressing, feeding, grooming and toileting 5. Can the patient actively participate in an intensive therapy program of at least 3 hrs of therapy 5 days a week? Yes 6. The potential for patient to make measurable gains while on inpatient rehab is fair 7. Anticipated functional outcomes upon discharge from inpatient rehab: modified independent and  supervision PT, modified independent and supervision OT, n/a SLP 8. Estimated rehab length of stay to reach the above functional goals is: 5-7 days 9. Anticipated discharge destination: Home 10. Overall Rehab/Functional Prognosis: fair     MD Signature:          Revision History Date/Time User Provider Type Action  04/07/2019 12:40 PM Pamela Heys, MD Physician Sign  04/07/2019 12:00 PM Raechel Ache, [place] [facility] Admission Coordinator Share   View Details Report

## 2019-04-07 NOTE — Consult Note (Signed)
[facility] Nurse Consult Note: Reason for Consult: Consult requested for right foot and right leg.  Performed remotely after review of the progress notes and photos in the EMR. Pt has been followed in the past by Dr Sharol Given of the orthopedic service, and podiatry prior to admission whom performed serial debridements, according to progress notes.  Pt was previously using Aquacel dressings.  Wound type: Right calf with full thickness chronic stasis ulcer. Right plantar foot with full thickness wound.   Dressing procedure/placement/frequency: Topical treatment orders provided for bedside nurses to perform daily as follows to absorb drainage and provide antimicrobial benifits:  Apply Aquacel to right leg and foot wounds Q day, then cover with foam dressings.  Pt should resume followup with podiatry after discharge for continued assessment.  Please re-consult if further assistance is needed.  Thank-you,  Julien Girt MSN, [place], [place], [place], [place]

## 2019-04-07 NOTE — H&P (Signed)
Physical Medicine and Rehabilitation Admission H&P     HPI: [name] is a 73 year old right-handed female with history of CAD with CABG, chronic combined congestive heart failure/ICM, end-stage renal disease with hemodialysis,, hypertension, peripheral vascular disease as well as remote history of tobacco abuse.  Per chart review patient lives with spouse.  Independent with assistive device.  Two-level home with bed and bath on main level and 2 steps to entry.  Presented 03/23/2019 with left gangrenous open wound to foot and has been followed with outpatient antibiotics.  Ultimately patient known bilateral SFA occlusions monophasic runoff of the ankle for vascular studies earlier in the year.  Underwent left common femoral enterectomy and Dacron patch angioplasty as well as left femoral to below-knee popliteal bypass 03/28/2019 per Dr. Donnetta Hutching.  Hospital course pain management MRI of left foot showed septic arthritis of the fifth MTP joint with osteomyelitis involving the distal half of the fifth metatarsal and also the proximal phalanx.  Orthopedic service was consulted in regards to findings of MRI of left foot Dr. Sharol Given and underwent left fifth ray amputation 04/06/2019.  Renal service continues to follow for history of CKD stage IV with hemodialysis as directed.   Therapy evaluations completed and patient was admitted for a comprehensive rehab program.  LBM 12/24- usually goes every 3 days, however has now gone 7 days- usually takes "miralax capsules- 3 of them daily and Colace 2 tabs BID" at home- not on high enough doses.  Plan for going to HD today after admission. Has fistula for dialysis x 15 mos on HD Pain meds make her groggy   Review of Systems  Constitutional: Negative for chills and fever.  HENT: Negative for hearing loss.   Eyes: Negative for blurred vision and double vision.  Respiratory: Positive for shortness of breath. Negative for cough.   Cardiovascular: Positive for  leg swelling. Negative for chest pain and palpitations.  Gastrointestinal: Positive for constipation. Negative for heartburn, nausea and vomiting.  Genitourinary: Negative for dysuria, flank pain and hematuria.  Musculoskeletal: Positive for joint pain and myalgias.  Skin: Negative for rash.  Neurological: Positive for weakness.  All other systems reviewed and are negative.  Past Medical History:  Diagnosis Date  . Allergy   . Arthritis    "maybe in my lower back" (08/27/2012)  . 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 Chronic combined systolic and diastolic CHF (congestive heart failure) ([facility])   . CKD (chronic kidney disease), stage IV ([facility])   . Coronary artery disease    a. NSTEMI in 05/2009 s/p CABG (LIMA-LAD, SVG-diagonal, SVG-OM1/OM 2).   Marland Kitchen Dyspnea    with exertion  . Family history of anesthesia complication    Mom has a hard time to wake up  . Foot ulcer ([place])    "I've had them on both feet" (08/27/2012)  . Gouty arthritis    "right index finger" (08/27/2012)  . History of radiation therapy 06/09/13-07/06/13   left breast 50Gy  . Hyperlipidemia   . Hypertension   . Infection    right second toe  . Ischemic cardiomyopathy    a. 2011: EF 40-45%. b. EF 55-60% in 04/2017 but shortly after as inpatient was 45-50%.  Marland Kitchen LBBB (left bundle branch block)   . Mitral regurgitation   . Neuropathy    Hx; of B/L feet  . NSTEMI (non-ST elevated myocardial infarction) ([facility]) 05/23/2009  . Osteomyelitis of foot ([facility])   .  PAD (peripheral artery disease) ([facility])    a. LE PAD (patient previously elected hold off L fem-pop), prev followed by Dr. Bridgett Larsson  . PONV (postoperative nausea and vomiting)   . Sinus headache    "occasionally" (08/27/2012)  . Type II diabetes mellitus ([facility])   . Vitamin D deficiency    Past Surgical History:  Procedure Laterality Date  . ABDOMINAL AORTOGRAM W/LOWER EXTREMITY Bilateral 03/25/2019   Procedure: ABDOMINAL  AORTOGRAM W/LOWER EXTREMITY;  Surgeon: Marty Heck, MD;  Location: [facility];  Service: Cardiovascular;  Laterality: Bilateral;  . AMPUTATION Right 08/27/2012   Procedure: AMPUTATION RAY;  Surgeon: Newt Minion, MD;  Location: [facility];  Service: Orthopedics;  Laterality: Right;  Right Foot 5th Ray Amputation  . AMPUTATION Left 07/08/2013   Procedure: AMPUTATION RAY;  Surgeon: Newt Minion, MD;  Location: [facility];  Service: Orthopedics;  Laterality: Left;  Left Foot 2nd Ray Amputation  . AMPUTATION Left 04/06/2019   Procedure: LEFT 5TH RAY AMPUTATION;  Surgeon: Newt Minion, MD;  Location: [facility];  Service: Orthopedics;  Laterality: Left;  . AMPUTATION RAY Right 08/27/2012   5th ray/notes 08/27/2012  . AMPUTATION TOE Right 03/06/2017   Procedure: AMPUTATION TOE, INTERPHANGEAL 2ND RIGHT;  Surgeon: [name], DPM;  Location: [facility];  Service: Podiatry;  Laterality: Right;  . AV FISTULA PLACEMENT Right 11/11/2017   Procedure: RIGHT RADIOCEPHALIC  ARTERIOVENOUS FISTULA;  Surgeon: [name], MD;  Location: [facility];  Service: Vascular;  Laterality: Right;  . BREAST LUMPECTOMY  04/20/2013   with biopsy      DR WAKEFIELD  . BREAST LUMPECTOMY WITH NEEDLE LOCALIZATION AND AXILLARY SENTINEL LYMPH NODE BX Left 04/20/2013   Procedure: LEFT BREAST WIRE GUIDED LUMPECTOMY AND AXILLARY SENTINEL LYMPH NODE BX;  Surgeon: Rolm Bookbinder, MD;  Location: [facility];  Service: General;  Laterality: Left;  . CARDIAC CATHETERIZATION  05/24/2009   Archie Endo 05/24/2009 (08/27/2012)  . CATARACT EXTRACTION W/ INTRAOCULAR LENS  IMPLANT, BILATERAL  2000's  . COLONOSCOPY W/ BIOPSIES AND POLYPECTOMY  2010  . CORONARY ARTERY BYPASS GRAFT  2011   "CABG X4" (08/27/2012)  . DENTAL SURGERY  04/30/11   "1 implant" (08/27/2012)  . DILATION AND CURETTAGE OF UTERUS    . ENDARTERECTOMY FEMORAL Left 03/28/2019   Procedure: ENDARTERECTOMY FEMORAL;  Surgeon: [name], MD;  Location: [facility];  Service: Vascular;  Laterality:  Left;  . EYE SURGERY    . FEMORAL-POPLITEAL BYPASS GRAFT Left 03/28/2019   Procedure: BYPASS GRAFT FEMORAL-POPLITEAL ARTERY;  Surgeon: [name], MD;  Location: [facility];  Service: Vascular;  Laterality: Left;  . FINGER SURGERY Right    "index finger; turned out to be gout" (08/27/2012)  . IR FLUORO GUIDE CV LINE RIGHT  11/30/2017  . PATCH ANGIOPLASTY Left 03/28/2019   Procedure: Patch Angioplasty;  Surgeon: [name], MD;  Location: [facility];  Service: Vascular;  Laterality: Left;  . TEE WITHOUT CARDIOVERSION N/A 12/01/2017   Procedure: TRANSESOPHAGEAL ECHOCARDIOGRAM (TEE);  Surgeon: Lelon Perla, MD;  Location: [facility];  Service: Cardiovascular;  Laterality: N/A;   Family History  Problem Relation Age of Onset  . Kidney cancer Brother 48  . Hypertension Brother   . Breast cancer Sister 75       LCIS; BRCA negative  . Throat cancer Father 23       smoker  . Breast cancer Sister 69       Lobular breast cancer  . Breast  cancer Maternal Aunt        dx in her 40s   Social History:  reports that she has never smoked. She has never used smokeless tobacco. She reports previous alcohol use. She reports that she does not use drugs. Allergies:  Allergies  Allergen Reactions  . Atenolol Other (See Comments) and Rash    Exacerbates gout Exacerbates gout  . Adhesive [Tape] Other (See Comments)    SKIN IS VERY SENSITIVE AND BRUISES AND TEARS EASILY; PLEASE USE AN ALTERNATIVE TO TAPE!!  . Contrast Media [Iodinated Diagnostic Agents] Rash  . Iohexol Rash and Other (See Comments)  . Latex Rash   Medications Prior to Admission  Medication Sig Dispense Refill  . allopurinol (ZYLOPRIM) 100 MG tablet Take 1 tablet 2 x /day to Prevent Gout (Patient taking differently: Take 100 mg by mouth 2 (two) times daily. ) 180 tablet 3  . Amino Acids-Protein Hydrolys (FEEDING SUPPLEMENT, PRO-STAT SUGAR FREE 64,) LIQD Take 30 mLs by mouth 2 (two) times daily. 887 mL 0  . aspirin EC 81 MG tablet Take  1 tablet (81 mg total) by mouth daily.    . Aspirin-Acetaminophen-Caffeine (EXCEDRIN EXTRA STRENGTH PO) Take 2 tablets by mouth daily as needed (pain).     Marland Kitchen atorvastatin (LIPITOR) 10 MG tablet Take 1 tablet (10 mg total) by mouth daily at 6 PM.    . b complex-vitamin c-folic acid (NEPHRO-VITE) 0.8 MG TABS tablet Take 1 tablet by mouth 3 (three) times a week. Tuesday, Thursday, Saturday    . BD VEO INSULIN SYRINGE U/F 31G X 15/64" 1 ML MISC USE AS DIRECTED WITH INSULIN 300 each 3  . cinacalcet (SENSIPAR) 30 MG tablet Take 30 mg by mouth 3 (three) times a week. Takes 1 tablet 3 days a week. Tues, Thur, Sat    . cyclobenzaprine (FLEXERIL) 5 MG tablet Take 1 tablet (5 mg total) by mouth 3 (three) times daily as needed for muscle spasms. 30 tablet 0  . docusate sodium (COLACE) 100 MG capsule Take 100 mg by mouth at bedtime.    . fluticasone (FLONASE) 50 MCG/ACT nasal spray Place 2 sprays into both nostrils daily as needed for allergies or rhinitis.    Marland Kitchen glucose blood (PRODIGY NO CODING BLOOD GLUC) test strip CHECK SUGARS 3 TIMES A DAY.DX-E11.22 300 each 3  . hydrALAZINE (APRESOLINE) 25 MG tablet Take 1 tablet (25 mg total) by mouth every 8 (eight) hours.    Marland Kitchen HYDROcodone-acetaminophen (NORCO/VICODIN) 5-325 MG tablet Take 0.5-1 tablets by mouth every 6 (six) hours as needed for severe pain. 20 tablet 0  . insulin aspart (NOVOLOG) 100 UNIT/ML injection Inject 0-15 Units into the skin 3 (three) times daily with meals. 10 mL 11  . insulin detemir (LEVEMIR) 100 UNIT/ML injection Inject 0.1 mLs (10 Units total) into the skin daily. 10 mL 11  . isosorbide dinitrate (ISORDIL) 10 MG tablet Take 1 tablet 3 x /day for BP & Heart (Patient taking differently: Take 10 mg by mouth 3 (three) times daily. Take 1 tablet 3 x /day for BP & Heart) 270 tablet 3  . Multiple Vitamins-Minerals (PRESERVISION AREDS 2 PO) Take 2 capsules by mouth daily.    . Nutritional Supplements (FEEDING SUPPLEMENT, NEPRO CARB STEADY,) LIQD Take  237 mLs by mouth 2 (two) times daily between meals.  0  . pantoprazole (PROTONIX) 40 MG tablet Take 1 tablet (40 mg total) by mouth daily.    Derrill Memo ON 04/08/2019] polyethylene glycol (MIRALAX / GLYCOLAX) 17  g packet Take 17 g by mouth daily.    . Probiotic Product (PROBIOTIC DAILY PO) Take 1 tablet by mouth daily.    . sevelamer carbonate (RENVELA) 800 MG tablet Take 1,600 mg by mouth 3 (three) times daily with meals.     . traZODone (DESYREL) 50 MG tablet 1/2-2 tablets 1 hour prior to bedtime for sleep. (Patient taking differently: Take 25-100 mg by mouth at bedtime. 1/2-2 tablets 1 hour prior to bedtime for sleep.) 60 tablet 2    Drug Regimen Review Drug regimen was reviewed and remains appropriate with no significant issues identified  Home: Home Living Family/patient expects to be discharged to:: Private residence Living Arrangements: Spouse/significant other   Functional History:    Functional Status:  Mobility:          ADL:    Cognition: Cognition Orientation Level: Oriented X4    Physical Exam: Blood pressure (!) 128/54, pulse 84, temperature 98.8 F (37.1 C), resp. rate 18, height 5' (1.524 m), weight 76 kg, SpO2 96 %. Physical Exam  Nursing note and vitals reviewed. Constitutional: She appears well-developed and well-nourished.  Obese with BMI of 32, sitting up in bed looking at menu, NAD Wearing wig?  HENT:  Head: Normocephalic and atraumatic.  Mouth/Throat: No oropharyngeal exudate.  Eyes: Pupils are equal, round, and reactive to light. EOM are normal. Right eye exhibits no discharge. Left eye exhibits no discharge. No scleral icterus.  Neck: No tracheal deviation present.  Cardiovascular:  RRR  Respiratory:  CTA B/L  GI:  Soft but distended, slightly TTP; hyperactive slightly  Musculoskeletal:     Cervical back: Normal range of motion and neck supple.     Comments: RUE and LUE tested deltoid/bicep/triceps/WE/grip/finger abd 5/5 B/L  RLE_ HF 5/5,  KE 5/5, DF/PF 5/5, EHL 5/5 LLE- tested HF 4+/5, and KE 4+/5- didn't test foot due to wrapped up and s/p 5th ray amputation.   Neurological:  Patient alert in no acute distress follows commands oriented x3 Decreased to LT on RLE and severely decreased to LT on LLE CN's intact by exam 2-12  Skin:  Surgical bypass sites lower extremities groin sites clean and dry with some ecchymosis.  Left foot dressing in place after ray amputation clean and dry appropriately tender L wrist IV- no infiltrate RUE fistula- has thrill  Psychiatric: She has a normal mood and affect.  appropriate    Results for orders placed or performed during the hospital encounter of 03/23/19 (from the past 48 hour(s))  Glucose, capillary     Status: Abnormal   Collection Time: 04/05/19  4:17 PM  Result Value Ref Range   Glucose-Capillary 119 (H) 70 - 99 mg/dL   Comment 1 QC Due   Glucose, capillary     Status: Abnormal   Collection Time: 04/05/19  9:27 PM  Result Value Ref Range   Glucose-Capillary 189 (H) 70 - 99 mg/dL  Glucose, capillary     Status: Abnormal   Collection Time: 04/06/19  6:33 AM  Result Value Ref Range   Glucose-Capillary 110 (H) 70 - 99 mg/dL  Glucose, capillary     Status: Abnormal   Collection Time: 04/06/19 10:28 AM  Result Value Ref Range   Glucose-Capillary 159 (H) 70 - 99 mg/dL  Glucose, capillary     Status: Abnormal   Collection Time: 04/06/19  1:30 PM  Result Value Ref Range   Glucose-Capillary 149 (H) 70 - 99 mg/dL  Glucose, capillary     Status: Abnormal  Collection Time: 04/06/19  4:19 PM  Result Value Ref Range   Glucose-Capillary 113 (H) 70 - 99 mg/dL  Glucose, capillary     Status: Abnormal   Collection Time: 04/06/19  9:59 PM  Result Value Ref Range   Glucose-Capillary 160 (H) 70 - 99 mg/dL  CBC     Status: Abnormal   Collection Time: 04/07/19  2:48 AM  Result Value Ref Range   WBC 11.6 (H) 4.0 - 10.5 K/uL   RBC 2.69 (L) 3.87 - 5.11 MIL/uL   Hemoglobin 8.5 (L) 12.0 -  15.0 g/dL   HCT 27.1 (L) 36.0 - 46.0 %   MCV 100.7 (H) 80.0 - 100.0 fL   MCH 31.6 26.0 - 34.0 pg   MCHC 31.4 30.0 - 36.0 g/dL   RDW 18.6 (H) 11.5 - 15.5 %   Platelets 224 150 - 400 K/uL   nRBC 0.0 0.0 - 0.2 %    Comment: Performed at [facility], [address] [place], [place] [address]  Magnesium     Status: None   Collection Time: 04/07/19  2:48 AM  Result Value Ref Range   Magnesium 2.4 1.7 - 2.4 mg/dL    Comment: Performed at [facility] [address] [place], [place] [address]  Comprehensive metabolic panel     Status: Abnormal   Collection Time: 04/07/19  2:48 AM  Result Value Ref Range   Sodium 135 135 - 145 mmol/L   Potassium 4.4 3.5 - 5.1 mmol/L   Chloride 98 98 - 111 mmol/L   CO2 24 22 - 32 mmol/L   Glucose, Bld 172 (H) 70 - 99 mg/dL   BUN 40 (H) 8 - 23 mg/dL   Creatinine, Ser 4.64 (H) 0.44 - 1.00 mg/dL   Calcium 7.5 (L) 8.9 - 10.3 mg/dL   Total Protein 5.2 (L) 6.5 - 8.1 g/dL   Albumin 2.4 (L) 3.5 - 5.0 g/dL   AST 11 (L) 15 - 41 U/L   ALT 8 0 - 44 U/L   Alkaline Phosphatase 86 38 - 126 U/L   Total Bilirubin 0.3 0.3 - 1.2 mg/dL   GFR calc non Af Amer 9 (L) >60 mL/min   GFR calc Af Amer 10 (L) >60 mL/min   Anion gap 13 5 - 15    Comment: Performed at [facility], [place] [address], [place], [place] [address]  Glucose, capillary     Status: Abnormal   Collection Time: 04/07/19  6:24 AM  Result Value Ref Range   Glucose-Capillary 144 (H) 70 - 99 mg/dL  Glucose, capillary     Status: Abnormal   Collection Time: 04/07/19  7:50 AM  Result Value Ref Range   Glucose-Capillary 146 (H) 70 - 99 mg/dL  Glucose, capillary     Status: Abnormal   Collection Time: 04/07/19 11:26 AM  Result Value Ref Range   Glucose-Capillary 175 (H) 70 - 99 mg/dL   No results found.     Medical Problem List and Plan: 1.  Debility secondary to osteomyelitis septic arthritis left fifth metatarsal joint, abscess and ulcer in the setting of peripheral vascular disease  status post left CFA enterectomy and femoral to below-knee popliteal bypass 03/28/2019 as well as left fifth ray amputation 04/06/2019.  Touchdown weightbearing left lower extremity, just for balance   -patient may shower if cover LLE  -ELOS/Goals: Mod I to supervision- ~7-10 days 2.  Antithrombotics: -DVT/anticoagulation: None needed after recent peripheral vascular surgery  -  antiplatelet therapy: Aspirin 81 mg daily 3. Pain Management: Flexeril and hydrocodone as needed 4. Mood: Trazodone nightly.  Provide emotional support  -antipsychotic agents: N/A 5. Neuropsych: This patient is capable of making decisions on her own behalf. 6. Skin/Wound Care: Routine skin checks for incisions from fempop bypass and L 5th ray amputation 7. Fluids/Electrolytes/Nutrition: Routine in and outs with follow-up chemistries 8.  End-stage renal disease.  Continue hemodialysis as directed- T/H/S- can restart Iron tomorrow since off IV ABX per Nephrology's note. Marland Kitchen9.Hypertension.  Hydralazine 25 mg every 8 hours, Isordil 10 mg 3 times daily.  Monitor with increased mobility 10.  Hyperlipidemia.  Lipitor 11.  History of gout.  Continue Zyloprim twice daily.  Monitor for gout flareups. 12.  Diabetes mellitus.  Levemir 10 units daily.  Check blood sugars before meals and at bedtime 13. Osteomyelitis- HAS COMPLETED IV ABX course. 14. Anemia of chronic disease- need to restart home Iron per Nephrology now that off IV ABX.   Courtney Heys, MD 04/07/2019

## 2019-04-07 NOTE — Progress Notes (Signed)
Physical Therapy Treatment Patient Details Name: [name] MRN: [id number] DOB: 1945-10-22 Today's Date: 04/07/2019    History of Present Illness Pt is a 73 y.o. female admitted 03/23/19 from wound care center due to infected L foot wound that failed outpatient antibiotics. S/p L CFA endarterectomy and femoral to BK bypass on 12/21. Pt now s/p L 5th ray amputation on 04/06/19 with TDWB status.  PMH includes CAD, CHF/ICM, CKD IV, HTN, DM, PAD (bilateral SFA occlusion 07/2018), L toe osteomyelitis.    PT Comments    Pt now s/p L 5th ray amputation with TDWB POD #1, so unable to ambulate as far due to TDWB.  She required min A for most transfers, but did require mod A for some bed mobility due to trying to get in correct position on top of full length bed massager that spouse bought for her.  The added height and maneuverability of the massager made bed mobility more difficult.   Pt did ambulate 5' with RW with min A initially but as fatigued required mod A to help maintain TDWB.  Pt still very motivated, good family support, with good rehab potential and continue to recommend CIR.    Follow Up Recommendations  CIR     Equipment Recommendations  None recommended by PT    Recommendations for Other Services Rehab consult     Precautions / Restrictions Precautions Precautions: Fall Restrictions LLE Weight Bearing: Touchdown weight bearing Other Position/Activity Restrictions: TDWB through heel per ortho note    Mobility  Bed Mobility Overal bed mobility: Needs Assistance Bed Mobility: Supine to Sit;Sit to Supine;Rolling Rolling: Min assist   Supine to sit: Min assist Sit to supine: Min assist   General bed mobility comments: For supine sit: assist with trunk; For sit to supine: assist for leg;  Performed multiple rolls with min A and cues for technique; Performed scooting up in bed: with min A and cues initially and then mod A x 2; Performed scooting laterally in bed with mod A  and cues for technique (moving shoulders, hips, then legs)  Transfers Overall transfer level: Needs assistance Equipment used: Rolling walker (2 wheeled) Transfers: Sit to/from [facility] Sit to Stand: Min assist Stand pivot transfers: Mod assist       General transfer comment: cues for hand placement, min A to power up, cues for TDWB, performed sit to stand x 2 and stand pivot x 1  Ambulation/Gait Ambulation/Gait assistance: Mod assist Gait Distance (Feet): 5 Feet Assistive device: Rolling walker (2 wheeled) Gait Pattern/deviations: Step-to pattern;Decreased stride length Gait velocity: Decreased   General Gait Details: fatigued easily; cues for TDWB; had difficulty with TDWB so required mod A to help maintain   Stairs             Wheelchair Mobility    Modified Rankin (Stroke Patients Only)       Balance Overall balance assessment: Needs assistance Sitting-balance support: Feet supported;No upper extremity supported Sitting balance-Leahy Scale: Good     Standing balance support: Bilateral upper extremity supported;During functional activity Standing balance-Leahy Scale: Fair                              Cognition Arousal/Alertness: Awake/alert Behavior During Therapy: WFL for tasks assessed/performed Overall Cognitive Status: Within Functional Limits for tasks assessed  Exercises      General Comments General comments (skin integrity, edema, etc.): Spent increased time on bed mobility to assist pt to get in correct position on her bed massager that spouse bought for her      Pertinent Vitals/Pain Pain Assessment: Faces Faces Pain Scale: Hurts little more Pain Location: LLE Pain Descriptors / Indicators: Guarding;Sore Pain Intervention(s): Limited activity within patient's tolerance;Repositioned    Home Living                      Prior Function             PT Goals (current goals can now be found in the care plan section) Progress towards PT goals: Not progressing toward goals - comment(new surgery and new weight bearing status so unable to ambulate as far)    Frequency    Min 3X/week      PT Plan Frequency needs to be updated    Co-evaluation              AM-PAC PT "6 Clicks" Mobility   Outcome Measure  Help needed turning from your back to your side while in a flat bed without using bedrails?: A Little Help needed moving from lying on your back to sitting on the side of a flat bed without using bedrails?: A Little Help needed moving to and from a bed to a chair (including a wheelchair)?: A Little Help needed standing up from a chair using your arms (e.g., wheelchair or bedside chair)?: A Little Help needed to walk in hospital room?: A Lot Help needed climbing 3-5 steps with a railing? : Total 6 Click Score: 15    End of Session Equipment Utilized During Treatment: Gait belt Activity Tolerance: Patient limited by fatigue Patient left: with call bell/phone within reach;in bed;with bed alarm set Nurse Communication: Mobility status PT Visit Diagnosis: Other abnormalities of gait and mobility (R26.89);Muscle weakness (generalized) (M62.81);Pain     Time: 1015-1100 PT Time Calculation (min) (ACUTE ONLY): 45 min  Charges:  $Gait Training: 8-22 mins $Therapeutic Activity: 23-37 mins                     [name], PT Acute Rehab Services Pager [phone] [facility] [phone]    Karlton Lemon 04/07/2019, 11:22 AM

## 2019-04-07 NOTE — Care Management Important Message (Signed)
Important Message  Patient Details  Name: [name] MRN: [id number] Date of Birth: Mar 14, 1946   Medicare Important Message Given:  Yes     [name] 04/07/2019, 2:39 PM

## 2019-04-07 NOTE — Progress Notes (Signed)
Patient ID: [name], female   DOB: 02-05-1946, 73 y.o.   MRN: [id number] Is postoperative day 1 left foot fifth ray amputation status post revascularization to the left lower extremity.  Dressing is clean and dry this morning she will be touchdown weightbearing on the heel as needed.  Patient is safe for discharge from an orthopedic standpoint.  Will change the dressing at follow-up in the office in 1 week.

## 2019-04-07 NOTE — Discharge Summary (Signed)
Physician Discharge Summary  [name] EXB:[id number] DOB: Jul 11, 1945 DOA: 03/23/2019  PCP: Unk Pinto, MD  Admit date: 03/23/2019 Discharge date: 04/07/2019  Admitted From: Home  Discharge disposition: CIR   Recommendations for Outpatient Follow-Up:   Follow-up with Dr. Sharol Given as outpatient.  As per rehabilitation.   Discharge Diagnosis:   Principal Problem:   Osteomyelitis of foot, left, acute (HCC) Active Problems:   Essential hypertension   PVD (peripheral vascular disease) (HCC)   Diabetes mellitus type 2, insulin dependent ([place])   ESRD on hemodialysis ([place])   Pyogenic inflammation of bone (HCC)   Septic arthritis of left foot (HCC)   Cellulitis of left foot   Abscess of right foot   Chronic bilateral low back pain without sciatica   Discharge Condition: Improved.  Diet recommendation:  Carbohydrate-modified.    Wound care: As per wound care team  Code status: Full.   History of Present Illness:   As Per HPI, Pamela Kawa Whitson58 year old female with PMH of CAD s/p CABG, chronic combined CHF/ICM, stage IV CKD (baseline creatinine 2.7-3.1), HTN, HLD, DM, lower extremity PAD (bilateral SFA occlusion 07/2018), chronic bilateral feet ulcerations, osteomyelitis of left toe, LBBB with chronic wound on her left foot that is followed at the wound care center closely did recently note worsening infection. She was then started on IV antibiotics including vancomycin at dialysis (reportedly completed 3 doses) and was sent to [facility] from the wound care center due to low-grade fevers and increased drainage from the ulcer. Patient was admitted for left little toe osteomyelitis, abscess and sepsis complicating PAD. Nephrology consulted for HD needs. S/p aortogram with bilateral lower extremity arteriogram with runoff 12/18 followed by left CFA endarterectomy and Dacron patch angioplasty, left femoral to below-knee popliteal bypass graft on 12/21.    Hospital Course:  Following conditions were addressed during hospitalization as listed below,  Osteomyelitis-septic arthritis left fifth metatarsal joint, abscess and ulcer in setting of Peripheral artery disease  Failed outpatient vancomycin and Zosyn.  Outpatient wound culture growing Serratia.  Transition to cefepime on 2/25 and Has completed antibiotic course.  MRI of the left foot on presentation showed septic arthritis of the fifth metatarsal phalangeal joint with osteomyelitis with surrounding phlegmon and abscess.  Diffuse cellulitis and myositis noted.  On 12/18 patient underwent lower extremity arteriogram with revealed 70% stenosis of the left common femoral artery with heavily calcified plaque, SFA occluded throughout proximal mid and distal segment-50% right common femoral artery stenosis and right SFA occlusion throughout the entire segment.  On 12/21-patient underwent left CFA endarterectomy and Dacron patch angioplasty and left below the knee popliteal bypass graft.  Status post left foot fifth ray amputation by Dr. Sharol Given orthopedics on 12/30. Touchdown weight bearing as per ortho. Recommend follow up in office in one week post op.   ulcers on right leg and foot Chronic.  Likely ischemic.  Follows up at  wound care center. Wound care on discharge  Essential hypertension Continue hydralazine and isosorbide dinitrate.    Diabetes mellitus type 2, insulin dependent  -Last hemoglobin A1c was 6.3-at home patient takes 70/30 insulin Continue Levemir and sliding scale insulin on discharge. Diabetic diet, Accu-Cheks.  Last POC glucose was 175.  ESRD on hemodialysis  -On Tuesday, Thursday Saturday schedule.  Continue hemodialysis as per nephrology.  Chronic systolic heart failure -Fluid management with dialysis.  Currently compensated.  Gout -Continue allopurinol  Anemia of chronic disease -Stable.  No acute bleeding noted.   Hemoglobin of  8.5 today from 8.0  yesterday.   Disposition.  At this time, patient is stable for disposition to CIR. Spoke with rehabilitation team.  Medical Consultants:    Vascular surgery  Orthopedic surgery  Nephrology  Procedures:     S/p aortogram with bilateral lower extremity arteriogram with runoff 12/18 by Dr. Carlis Abbott, vascular surgery  Left CFA endarterectomy and Dacron patch angioplasty, left femoral to below-knee popliteal bypass graft on 12/21 by Dr. Donnetta Hutching, vascular surgery  Hemodialysis.  Status post left foot fifth ray amputation by Dr. Sharol Given orthopedics on 12/30.  Subjective:   Today, patient feels better.  Has not had a bowel movement in several days.  Denies any nausea, vomiting or abdominal distention.  Denies overt pain.  Discharge Exam:   Vitals:   04/07/19 0651 04/07/19 0753  BP: (!) 117/50 (!) 109/49  Pulse:  94  Resp:  15  Temp:  98.8 F (37.1 C)  SpO2:  90%   Vitals:   04/06/19 2220 04/07/19 0406 04/07/19 0651 04/07/19 0753  BP: (!) 124/53 (!) 122/51 (!) 117/50 (!) 109/49  Pulse:  96  94  Resp:  20  15  Temp:  98.1 F (36.7 C)  98.8 F (37.1 C)  TempSrc:  Oral  Oral  SpO2:  92%  90%  Weight:      Height:       GENERAL: Patient is alert awake and oriented. Not in obvious distress.  Obese, HENT: No scleral pallor or icterus. Pupils equally reactive to light. Oral mucosa is moist NECK: is supple, no palpable thyroid enlargement. CHEST: Clear to auscultation. No crackles or wheezes. Non tender on palpation. Diminished breath sounds bilaterally. CVS: S1 and S2 heard, no murmur. Regular rate and rhythm. No pericardial rub. ABDOMEN: Soft, non-tender, bowel sounds are present. No palpable hepato-splenomegaly. EXTREMITIES: Left leg with ulcers, right leg with ulcers, trace left leg edema. Left 5th toe ray amputation with dressing  The results of significant diagnostics from this hospitalization (including imaging, microbiology, ancillary and laboratory) are listed below for  reference.     Diagnostic Studies:   DG Chest 2 View  Result Date: 03/23/2019 CLINICAL DATA:  Left foot ulcer. Sepsis. EXAM: CHEST - 2 VIEW COMPARISON:  Aug 25, 2018. FINDINGS: The heart size and mediastinal contours are within normal limits. No pneumothorax or significant pleural effusion is noted. Status post coronary bypass graft. No acute pulmonary disease is noted. Atherosclerosis of thoracic aorta is noted. The visualized skeletal structures are unremarkable. IMPRESSION: No active cardiopulmonary disease. Aortic atherosclerosis. Electronically Signed   By: Marijo Conception M.D.   On: 03/23/2019 14:07    Labs:   Basic Metabolic Panel: Recent Labs  Lab 04/03/19 0730 04/05/19 0724 04/07/19 0248  NA 133* 134* 135  K 3.9 4.2 4.4  CL 94* 96* 98  CO2 25 21* 24  GLUCOSE 146* 100* 172*  BUN 41* 45* 40*  CREATININE 5.19* 5.58* 4.64*  CALCIUM 7.3* 7.5* 7.5*  MG  --   --  2.4  PHOS 4.7* 4.5  --    GFR Estimated Creatinine Clearance: 10.7 mL/min (A) (by C-G formula based on SCr of 4.64 mg/dL (H)). Liver Function Tests: Recent Labs  Lab 04/03/19 0730 04/05/19 0724 04/07/19 0248  AST  --   --  11*  ALT  --   --  8  ALKPHOS  --   --  86  BILITOT  --   --  0.3  PROT  --   --  5.2*  ALBUMIN 2.4* 2.3* 2.4*   No results for input(s): LIPASE, AMYLASE in the last 168 hours. No results for input(s): AMMONIA in the last 168 hours. Coagulation profile No results for input(s): INR, PROTIME in the last 168 hours.  CBC: Recent Labs  Lab 04/03/19 0729 04/05/19 0724 04/07/19 0248  WBC 8.7 7.1 11.6*  HGB 8.0* 8.0* 8.5*  HCT 25.4* 25.5* 27.1*  MCV 99.2 99.6 100.7*  PLT 246 221 224   Cardiac Enzymes: No results for input(s): CKTOTAL, CKMB, CKMBINDEX, TROPONINI in the last 168 hours. BNP: Invalid input(s): POCBNP CBG: Recent Labs  Lab 04/06/19 1619 04/06/19 2159 04/07/19 0624 04/07/19 0750 04/07/19 1126  GLUCAP 113* 160* 144* 146* 175*   D-Dimer No results for  input(s): DDIMER in the last 72 hours. Hgb A1c No results for input(s): HGBA1C in the last 72 hours. Lipid Profile No results for input(s): CHOL, HDL, LDLCALC, TRIG, CHOLHDL, LDLDIRECT in the last 72 hours. Thyroid function studies No results for input(s): TSH, T4TOTAL, T3FREE, THYROIDAB in the last 72 hours.  Invalid input(s): FREET3 Anemia work up Recent Labs    04/05/19 0310  FERRITIN 375*  TIBC 237*  IRON 45   Microbiology No results found for this or any previous visit (from the past 240 hour(s)).   Discharge Instructions:   Discharge Instructions    Diet Carb Modified   Complete by: As directed    Discharge instructions   Complete by: As directed    As per rehabilitation.  To follow-up with Dr. Sharol Given as outpatient   Increase activity slowly   Complete by: As directed    Increase activity slowly   Complete by: As directed    As per inpatient rehab     Allergies as of 04/07/2019      Reactions   Atenolol Other (See Comments), Rash   Exacerbates gout Exacerbates gout   Adhesive [tape] Other (See Comments)   SKIN IS VERY SENSITIVE AND BRUISES AND TEARS EASILY; PLEASE USE AN ALTERNATIVE TO TAPE!!   Contrast Media [iodinated Diagnostic Agents] Rash   Iohexol Rash, Other (See Comments)   Latex Rash      Medication List    STOP taking these medications   insulin NPH-regular Human (70-30) 100 UNIT/ML injection   loratadine 10 MG tablet Commonly known as: CLARITIN   psyllium 58.6 % powder Commonly known as: METAMUCIL     TAKE these medications   allopurinol 100 MG tablet Commonly known as: ZYLOPRIM Take 1 tablet 2 x /day to Prevent Gout What changed:   how much to take  how to take this  when to take this  additional instructions   aspirin EC 81 MG tablet Take 1 tablet (81 mg total) by mouth daily.   atorvastatin 10 MG tablet Commonly known as: LIPITOR Take 1 tablet (10 mg total) by mouth daily at 6 PM.   b complex-vitamin c-folic acid 0.8  MG Tabs tablet Take 1 tablet by mouth 3 (three) times a week. Tuesday, Thursday, Saturday   BD Veo Insulin Syringe U/F 31G X 15/64" 1 ML Misc Generic drug: Insulin Syringe-Needle U-100 USE AS DIRECTED WITH INSULIN   cinacalcet 30 MG tablet Commonly known as: SENSIPAR Take 30 mg by mouth 3 (three) times a week. Takes 1 tablet 3 days a week. Tues, Thur, Sat   cyclobenzaprine 5 MG tablet Commonly known as: FLEXERIL Take 1 tablet (5 mg total) by mouth 3 (three) times daily as needed for muscle spasms.  docusate sodium 100 MG capsule Commonly known as: COLACE Take 100 mg by mouth at bedtime.   EXCEDRIN EXTRA STRENGTH PO Take 2 tablets by mouth daily as needed (pain).   feeding supplement (NEPRO CARB STEADY) Liqd Take 237 mLs by mouth 2 (two) times daily between meals.   feeding supplement (PRO-STAT SUGAR FREE 64) Liqd Take 30 mLs by mouth 2 (two) times daily.   fluticasone 50 MCG/ACT nasal spray Commonly known as: FLONASE Place 2 sprays into both nostrils daily as needed for allergies or rhinitis.   glucose blood test strip Commonly known as: Prodigy No Coding Blood Gluc CHECK SUGARS 3 TIMES A DAY.[id number]   hydrALAZINE 25 MG tablet Commonly known as: APRESOLINE Take 1 tablet (25 mg total) by mouth every 8 (eight) hours. What changed:   how much to take  how to take this  when to take this  additional instructions   HYDROcodone-acetaminophen 5-325 MG tablet Commonly known as: NORCO/VICODIN Take 0.5-1 tablets by mouth every 6 (six) hours as needed for severe pain.   insulin aspart 100 UNIT/ML injection Commonly known as: novoLOG Inject 0-15 Units into the skin 3 (three) times daily with meals.   insulin detemir 100 UNIT/ML injection Commonly known as: LEVEMIR Inject 0.1 mLs (10 Units total) into the skin daily.   isosorbide dinitrate 10 MG tablet Commonly known as: ISORDIL Take 1 tablet 3 x /day for BP & Heart What changed:   how much to take  how to  take this  when to take this   pantoprazole 40 MG tablet Commonly known as: PROTONIX Take 1 tablet (40 mg total) by mouth daily.   polyethylene glycol 17 g packet Commonly known as: MIRALAX / GLYCOLAX Take 17 g by mouth daily. Start taking on: April 08, 2019   PRESERVISION AREDS 2 PO Take 2 capsules by mouth daily.   PROBIOTIC DAILY PO Take 1 tablet by mouth daily.   sevelamer carbonate 800 MG tablet Commonly known as: RENVELA Take 1,600 mg by mouth 3 (three) times daily with meals.   traZODone 50 MG tablet Commonly known as: DESYREL 1/2-2 tablets 1 hour prior to bedtime for sleep. What changed:   how much to take  how to take this  when to take this      Follow-up Information    Early, Arvilla Meres, MD In 3 weeks.   Specialties: Vascular Surgery, Cardiology Why: sent Contact information: [address] [place] [address] [phone]        Newt Minion, MD In 1 week.   Specialty: Orthopedic Surgery Contact information: [place] [address] [phone]            Time coordinating discharge: 39 minutes  Signed:  Anant Agard  [facility] 04/07/2019, 11:31 AM

## 2019-04-07 NOTE — Progress Notes (Signed)
Pamela Ribas, MD  Physician  Physical Medicine and Rehabilitation  Consult Note  Addendum  Date of Service:  04/02/2019  6:26 PM      Related encounter: ED to Hosp-Admission (Discharged) from 03/23/2019 in [facility] 4E CV SURGICAL [place] [facility]            Physical Medicine and Rehabilitation Consult Reason for Consult: Impaired mobility and ADLs Referring Physician: Dr. Debbe Odea     HPI: [name] is a 73 y.o. female who was previously independent, with a PMH of CAD/ICM, stage IV CKD (baseline creatinine 2.7-3.1), HTN, HLD, DM, lower extremity PAD (bilateral SFA occlusion 07/2018), chronic bilateral feet ulcerations, osteomyelitis of the left toe, and LBBB, who has a chronic would on her left foot that is followed at the wound care center. She was recently noticed to wave worsening infection and started on IV antibiotics including vancomycin at dialysis, but was sent to [facility] from her wound care center due to low-grade fevers and increased drainage from the ulcer. She was admitted for left toe osteomyelitis, abscess, and sepsis complicating PAD. Nephrology following for hemodialysis needs. S/p 12/18 aortogram with bilateral lower extremity arteriogram with runoff, followed by left CFA endarterectomy and Dacron patch angioplasty, left femoral to below-knee popliteal bypass graft on 12/21. Orthopedics/Dr. Sharol Given do not plan on doing any further procedure in the hospital.      ROS +lower back pain with sciatic. Otherwise negative.      Past Medical History:  Diagnosis Date  . Allergy    . Arthritis      "maybe in my lower back" (08/27/2012)  . 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 Chronic combined systolic and diastolic CHF (congestive heart failure) ([place])    . CKD (chronic kidney disease), stage IV ([place])    . Coronary artery disease      a. NSTEMI in 05/2009 s/p CABG (LIMA-LAD,  SVG-diagonal, SVG-OM1/OM 2).   Marland Kitchen Dyspnea      with exertion  . Family history of anesthesia complication      Mom has a hard time to wake up  . Foot ulcer ([place])      "I've had them on both feet" (08/27/2012)  . Gouty arthritis      "right index finger" (08/27/2012)  . History of radiation therapy 06/09/13-07/06/13    left breast 50Gy  . Hyperlipidemia    . Hypertension    . Infection      right second toe  . Ischemic cardiomyopathy      a. 2011: EF 40-45%. b. EF 55-60% in 04/2017 but shortly after as inpatient was 45-50%.  Marland Kitchen LBBB (left bundle branch block)    . Mitral regurgitation    . Neuropathy      Hx; of B/L feet  . NSTEMI (non-ST elevated myocardial infarction) ([place]) 05/23/2009  . Osteomyelitis of foot ([place])    . PAD (peripheral artery disease) ([facility])      a. LE PAD (patient previously elected hold off L fem-pop), prev followed by Dr. Bridgett Larsson  . PONV (postoperative nausea and vomiting)    . Sinus headache      "occasionally" (08/27/2012)  . Type II diabetes mellitus ([place])    . Vitamin D deficiency           Past Surgical History:  Procedure Laterality Date  . ABDOMINAL AORTOGRAM W/LOWER EXTREMITY Bilateral 03/25/2019  Procedure: ABDOMINAL AORTOGRAM W/LOWER EXTREMITY;  Surgeon: Marty Heck, MD;  Location: [place] CV LAB;  Service: Cardiovascular;  Laterality: Bilateral;  . AMPUTATION Right 08/27/2012    Procedure: AMPUTATION RAY;  Surgeon: Newt Minion, MD;  Location: [place];  Service: Orthopedics;  Laterality: Right;  Right Foot 5th Ray Amputation  . AMPUTATION Left 07/08/2013    Procedure: AMPUTATION RAY;  Surgeon: Newt Minion, MD;  Location: [place];  Service: Orthopedics;  Laterality: Left;  Left Foot 2nd Ray Amputation  . AMPUTATION RAY Right 08/27/2012    5th ray/notes 08/27/2012  . AMPUTATION TOE Right 03/06/2017    Procedure: AMPUTATION TOE, INTERPHANGEAL 2ND RIGHT;  Surgeon: [name], DPM;  Location: [place];  Service: Podiatry;  Laterality: Right;  .  AV FISTULA PLACEMENT Right 11/11/2017    Procedure: RIGHT RADIOCEPHALIC  ARTERIOVENOUS FISTULA;  Surgeon: [name], MD;  Location: [place];  Service: Vascular;  Laterality: Right;  . BREAST LUMPECTOMY   04/20/2013    with biopsy      DR WAKEFIELD  . BREAST LUMPECTOMY WITH NEEDLE LOCALIZATION AND AXILLARY SENTINEL LYMPH NODE BX Left 04/20/2013    Procedure: LEFT BREAST WIRE GUIDED LUMPECTOMY AND AXILLARY SENTINEL LYMPH NODE BX;  Surgeon: Rolm Bookbinder, MD;  Location: [place];  Service: General;  Laterality: Left;  . CARDIAC CATHETERIZATION   05/24/2009    Archie Endo 05/24/2009 (08/27/2012)  . CATARACT EXTRACTION W/ INTRAOCULAR LENS  IMPLANT, BILATERAL   2000's  . COLONOSCOPY W/ BIOPSIES AND POLYPECTOMY   2010  . CORONARY ARTERY BYPASS GRAFT   2011    "CABG X4" (08/27/2012)  . DENTAL SURGERY   04/30/11    "1 implant" (08/27/2012)  . DILATION AND CURETTAGE OF UTERUS      . ENDARTERECTOMY FEMORAL Left 03/28/2019    Procedure: ENDARTERECTOMY FEMORAL;  Surgeon: [name], MD;  Location: [place];  Service: Vascular;  Laterality: Left;  . EYE SURGERY      . FEMORAL-POPLITEAL BYPASS GRAFT Left 03/28/2019    Procedure: BYPASS GRAFT FEMORAL-POPLITEAL ARTERY;  Surgeon: [name], MD;  Location: [place];  Service: Vascular;  Laterality: Left;  . FINGER SURGERY Right      "index finger; turned out to be gout" (08/27/2012)  . IR FLUORO GUIDE CV LINE RIGHT   11/30/2017  . PATCH ANGIOPLASTY Left 03/28/2019    Procedure: Patch Angioplasty;  Surgeon: [name], MD;  Location: [place];  Service: Vascular;  Laterality: Left;  . TEE WITHOUT CARDIOVERSION N/A 12/01/2017    Procedure: TRANSESOPHAGEAL ECHOCARDIOGRAM (TEE);  Surgeon: Lelon Perla, MD;  Location: [facility] ENDOSCOPY;  Service: Cardiovascular;  Laterality: N/A;         Family History  Problem Relation Age of Onset  . Kidney cancer Brother 39  . Hypertension Brother    . Breast cancer Sister 25        LCIS; BRCA negative  . Throat cancer Father 66         smoker  . Breast cancer Sister 9        Lobular breast cancer  . Breast cancer Maternal Aunt          dx in her 42s    Social History:  reports that she has quit smoking. She has never used smokeless tobacco. She reports current alcohol use. She reports that she does not use drugs. Allergies:       Allergies  Allergen Reactions  . Atenolol Other (See Comments)  and Rash      Exacerbates gout Exacerbates gout  . Adhesive [Tape] Other (See Comments)      SKIN IS VERY SENSITIVE AND BRUISES AND TEARS EASILY; PLEASE USE AN ALTERNATIVE TO TAPE!!  . Contrast Media [Iodinated Diagnostic Agents] Rash  . Iohexol Rash and Other (See Comments)  . Latex Rash          Medications Prior to Admission  Medication Sig Dispense Refill  . allopurinol (ZYLOPRIM) 100 MG tablet Take 1 tablet 2 x /day to Prevent Gout (Patient taking differently: Take 100 mg by mouth 2 (two) times daily. ) 180 tablet 3  . aspirin EC 81 MG tablet Take 1 tablet (81 mg total) by mouth daily.      . Aspirin-Acetaminophen-Caffeine (EXCEDRIN EXTRA STRENGTH PO) Take 2 tablets by mouth daily as needed (pain).       Marland Kitchen b complex-vitamin c-folic acid (NEPHRO-VITE) 0.8 MG TABS tablet Take 1 tablet by mouth 3 (three) times a week. Tuesday, Thursday, Saturday      . cinacalcet (SENSIPAR) 30 MG tablet Take 30 mg by mouth 3 (three) times a week. Takes 1 tablet 3 days a week. Tues, Thur, Sat      . docusate sodium (COLACE) 100 MG capsule Take 100 mg by mouth at bedtime.      . fluticasone (FLONASE) 50 MCG/ACT nasal spray Place 2 sprays into both nostrils daily as needed for allergies or rhinitis.      . hydrALAZINE (APRESOLINE) 25 MG tablet Take 3 tablets (75 mg) 3 x /day for BP & Heart 810 tablet 3  . insulin NPH-regular Human (70-30) 100 UNIT/ML injection Inject 10-30 Units into the skin 2 (two) times daily with a meal.      . isosorbide dinitrate (ISORDIL) 10 MG tablet Take 1 tablet 3 x /day for BP & Heart (Patient taking differently:  Take 10 mg by mouth 3 (three) times daily. Take 1 tablet 3 x /day for BP & Heart) 270 tablet 3  . loratadine (CLARITIN) 10 MG tablet Take 10 mg by mouth daily.      . Multiple Vitamins-Minerals (PRESERVISION AREDS 2 PO) Take 2 capsules by mouth daily.      . Probiotic Product (PROBIOTIC DAILY PO) Take 1 tablet by mouth daily.      . psyllium (METAMUCIL) 58.6 % powder Take 1 packet by mouth 3 (three) times daily.      . sevelamer carbonate (RENVELA) 800 MG tablet Take 1,600 mg by mouth 3 (three) times daily with meals.       . traZODone (DESYREL) 50 MG tablet 1/2-2 tablets 1 hour prior to bedtime for sleep. (Patient taking differently: Take 25-100 mg by mouth at bedtime. 1/2-2 tablets 1 hour prior to bedtime for sleep.) 60 tablet 2  . [DISCONTINUED] HYDROcodone-acetaminophen (NORCO/VICODIN) 5-325 MG tablet Take 0.5-1 tablets by mouth every 6 (six) hours as needed for severe pain. 20 tablet 0  . BD VEO INSULIN SYRINGE U/F 31G X 15/64" 1 ML MISC USE AS DIRECTED WITH INSULIN 300 each 3  . glucose blood (PRODIGY NO CODING BLOOD GLUC) test strip CHECK SUGARS 3 TIMES A DAY.[id number] 300 each 3      Home: Home Living Family/patient expects to be discharged to:: Private residence Living Arrangements: Spouse/significant other Available Help at Discharge: Family, Available 24 hours/day Type of Home: House Home Access: Stairs to enter [employer] of Steps: 1 onto porch then 1 into main house, 2 steps into kitchen Home Layout: Two  level, Able to live on main level with bedroom/bathroom Bathroom Shower/Tub: Multimedia programmer: Handicapped height Home Equipment: Environmental consultant - 2 wheels, Transport chair, Wheelchair - manual, Grab bars - toilet Additional Comments: Husband currently recovering from THA and using walker. Son visiting from out of town to provide assist as needed  Functional History: Prior Function Level of Independence: Independent with assistive device(s) Comments: Indep  with household ambulation, uses walking stick outside. Sponge bathes Functional Status:  Mobility: Bed Mobility Overal bed mobility: Needs Assistance Bed Mobility: Supine to Sit, Sit to Supine Supine to sit: Mod assist Sit to supine: Mod assist General bed mobility comments: Improved ability to maneuver LLE to EOB with minA, modA for trunk elevation; modA for LEs return to supine Transfers Overall transfer level: Needs assistance Equipment used: Rolling walker (2 wheeled), 1 person hand held assist Transfers: Sit to/from Stand Sit to Stand: Max assist, From elevated surface General transfer comment: Stood to RW reliant on UE support of therapist and maxA to elevate trunk, increased time to stablize and correct posterior lean, increased time to WB through LLE Ambulation/Gait Ambulation/Gait assistance: Mod assist Gait Distance (Feet): 1 Feet Assistive device: Rolling walker (2 wheeled) Gait Pattern/deviations: Step-to pattern, Decreased weight shift to left, Antalgic, Leaning posteriorly, Trunk flexed General Gait Details: Pt able to take side steps towards HOB with RW, requiring modA to maintain balance when WB through LLE in order to take step with R foot. Able to take complete step with R foot, reliant on sliding L foot Gait velocity: Decreased   ADL: ADL Overall ADL's : Needs assistance/impaired Grooming: Sitting, Min guard Upper Body Bathing: Min guard, Sitting Lower Body Bathing: Maximal assistance, Sitting/lateral leans Upper Body Dressing : Min guard, Sitting Lower Body Dressing: Maximal assistance, Total assistance, Sitting/lateral leans Lower Body Dressing Details (indicate cue type and reason): unable to don sock to L foot seated EOB Toilet Transfer: Maximal assistance, +2 for physical assistance, +2 for safety/equipment, BSC, RW Toilet Transfer Details (indicate cue type and reason): simulated with sit to stand, unable first two attempts, with max A able to fully extend at  hips however limited balance, activity tolerance, strength. currently recommend x2 to bedside commode Toileting- Clothing Manipulation and Hygiene: Total assistance, Sitting/lateral lean Functional mobility during ADLs: Maximal assistance General ADL Comments: due to decreased strength, activity tolerance, safety patient requiring increased assistance with self care   Cognition: Cognition Overall Cognitive Status: Impaired/Different from baseline Orientation Level: Oriented X4 Cognition Arousal/Alertness: Awake/alert Behavior During Therapy: WFL for tasks assessed/performed Overall Cognitive Status: Impaired/Different from baseline Area of Impairment: Attention, Problem solving, Memory Current Attention Level: Selective Memory: Decreased short-term memory Problem Solving: Requires verbal cues General Comments: Pt aware of apparent cognitive deficits today, increased fatigue secondary to HD this AM and distracted by pain   Blood pressure 131/62, pulse 82, temperature 98.4 F (36.9 C), temperature source Oral, resp. rate 19, height '5\' 4"'  (1.626 m), weight 72.9 kg, SpO2 99 %.    Physical Exam General: Alert and oriented x 3, No apparent distress HEENT: Head is normocephalic, atraumatic, PERRLA, EOMI, sclera anicteric, oral mucosa pink and moist, dentition intact, ext ear canals clear,  Neck: Supple without JVD or lymphadenopathy Heart: Reg rate and rhythm. No murmurs rubs or gallops Chest: CTA bilaterally without wheezes, rales, or rhonchi; no distress Abdomen: Soft, non-tender, non-distended, bowel sounds positive. Extremities: No clubbing, cyanosis, or edema. Pulses are 2+ Skin: left foot with ulcer on 5th MTP joint, minimal bloody and yellow discharge.  Neuro: Pt is cognitively appropriate with normal insight, memory, and awareness. Cranial nerves 2-12 are intact. Grossly 5/5 strength throughout.  Psych: Pt's affect is appropriate. Pt is cooperative   Lab Results Last 24 Hours        Results for orders placed or performed during the hospital encounter of 03/23/19 (from the past 24 hour(s))  Glucose, capillary     Status: Abnormal    Collection Time: 04/01/19  9:26 PM  Result Value Ref Range    Glucose-Capillary 163 (H) 70 - 99 mg/dL  Glucose, capillary     Status: Abnormal    Collection Time: 04/02/19  6:14 AM  Result Value Ref Range    Glucose-Capillary 101 (H) 70 - 99 mg/dL  Glucose, capillary     Status: Abnormal    Collection Time: 04/02/19 11:06 AM  Result Value Ref Range    Glucose-Capillary 143 (H) 70 - 99 mg/dL  Glucose, capillary     Status: Abnormal    Collection Time: 04/02/19  4:11 PM  Result Value Ref Range    Glucose-Capillary 122 (H) 70 - 99 mg/dL      Imaging Results (Last 48 hours)  No results found.       Assessment/Plan: Diagnosis: Impaired mobility and ADLs following left femoral endarterectomy with femoral to BK pop bypass 1. Does the need for close, 24 hr/day medical supervision in concert with the patient's rehab needs make it unreasonable for this patient to be served in a less intensive setting? Yes 2. Co-Morbidities requiring supervision/potential complications: Right leg and foot ulcers, HTN, DM2, ESRD on HD, chronic systolic HF, gout, anemia of chronic disease 3. Due to bladder management, bowel management, safety, skin/wound care, disease management, medication administration, pain management and patient education, does the patient require 24 hr/day rehab nursing? Yes 4. Does the patient require coordinated care of a physician, rehab nurse, therapy disciplines of PT, OT to address physical and functional deficits in the context of the above medical diagnosis(es)? Yes Addressing deficits in the following areas: balance, endurance, locomotion, strength, transferring, bathing, dressing, feeding, grooming, toileting and psychosocial support 5. Can the patient actively participate in an intensive therapy program of at least 3 hrs of  therapy per day at least 5 days per week? Yes 6. The potential for patient to make measurable gains while on inpatient rehab is good 7. Anticipated functional outcomes upon discharge from inpatient rehab are modified independent  with PT, modified independent with OT, independent with SLP. 8. Estimated rehab length of stay to reach the above functional goals is: 10-14 days 9. Anticipated discharge destination: Home 10. Overall Rehab/Functional Prognosis: good   RECOMMENDATIONS: This patient's condition is appropriate for continued rehabilitative care in the following setting: CIR Patient has agreed to participate in recommended program. Yes Note that insurance prior authorization may be required for reimbursement for recommended care.   Comment: Mrs. Alfieri was previously independent and now significantly impaired--ambulated 1 foot Mod A RW with PT, ModA in bed mobility. She has significant comorbidities and would benefit from CIR. Her husband will be able to care for her at home. He received hip surgery 1 week ago but is recovering well. In the event that he couldn't help her, her sister would be able to help.   Pamela Ribas, MD 04/02/2019    Revision History                     Routing History

## 2019-04-07 NOTE — Progress Notes (Signed)
Inpatient Rehabilitation-Admissions Coordinator   I have received medical clearance from Dr. Louanne Belton for admit to CIR today. Pt wants to proceed with CIR at this time. I will review insurance benefits letter and consent forms. Will notify RN and [facility] team regarding plans for admit to CIR today.   Please call if questions.   Raechel Ache, OTR/L  Rehab Admissions Coordinator  [phone] 04/07/2019 11:50 AM

## 2019-04-07 NOTE — Progress Notes (Signed)
Subjective:  No cos / for HD  Today   Objective Vital signs in last 24 hours: Vitals:   04/06/19 2220 04/07/19 0406 04/07/19 0651 04/07/19 0753  BP: (!) 124/53 (!) 122/51 (!) 117/50 (!) 109/49  Pulse:  96  94  Resp:  20  15  Temp:  98.1 F (36.7 C)  98.8 F (37.1 C)  TempSrc:  Oral  Oral  SpO2:  92%  90%  Weight:      Height:       Weight change:   Physical Exam General: alert,Elderly female, nad  Heart: RRR  Lungs: Clear bilaterally  Abdomen: soft non-tender  Extremities: Trace LE edema  Dialysis Access: RUE AVF +bruit    Dialysis Orders:  [facility], TTS, EDW 72 kg, BFR 350, DFR 800, Optiflux 180, 3K/2.25 calcium, vancomycin 750 mg q. HD, Mircera 50 mcg every 2 weeks, Hectorol 4 mcg IV 3 times weekly, heparin 5000 unit bolus. Right radiocephalic fistula.  Problem/Plan: 1. Left 5th metatarsal osteomyelitis/septic arthritis now s/p left femoral endarterectomy (with prior left fem-pop bypass).Now SP Left 5th ray amp Dr Sharol Given .  Antibiotics narrowed to IV cefepime. Plans for REHAB  CIR Accepted !!!  2. ESRD - HD TTS. Next HD 12/31. today  3. HTN/volume-  BP controlled. No gross volume overload on exam. Has been below EDW, weights variable.  UF 1-2L  as tolerated.  4. Anemia- Hgb 8.5  Today / Aranesp 60 q Tues. Will incr to 100 / Tsat 19%. Start Fe course when antibiotics complete/infection resolved.  5. Metabolic Bone Disease-  Corr Ca/Phos ok. Continue Renvela binder/VDRA.  6. Nutrition - Add prot supp for low albumin  Dispo: To continue ongoing PT/OT  following admission to CIR or SNF.   Ernest Haber, PA-C [facility] [phone] 04/07/2019,12:11 PM  LOS: 15 days   Labs: Basic Metabolic Panel: Recent Labs  Lab 04/03/19 0730 04/05/19 0724 04/07/19 0248  NA 133* 134* 135  K 3.9 4.2 4.4  CL 94* 96* 98  CO2 25 21* 24  GLUCOSE 146* 100* 172*  BUN 41* 45* 40*  CREATININE 5.19* 5.58* 4.64*  CALCIUM 7.3* 7.5* 7.5*  PHOS 4.7* 4.5  --    Liver  Function Tests: Recent Labs  Lab 04/03/19 0730 04/05/19 0724 04/07/19 0248  AST  --   --  11*  ALT  --   --  8  ALKPHOS  --   --  86  BILITOT  --   --  0.3  PROT  --   --  5.2*  ALBUMIN 2.4* 2.3* 2.4*   No results for input(s): LIPASE, AMYLASE in the last 168 hours. No results for input(s): AMMONIA in the last 168 hours. CBC: Recent Labs  Lab 04/03/19 0729 04/05/19 0724 04/07/19 0248  WBC 8.7 7.1 11.6*  HGB 8.0* 8.0* 8.5*  HCT 25.4* 25.5* 27.1*  MCV 99.2 99.6 100.7*  PLT 246 221 224   Cardiac Enzymes: No results for input(s): CKTOTAL, CKMB, CKMBINDEX, TROPONINI in the last 168 hours. CBG: Recent Labs  Lab 04/06/19 1619 04/06/19 2159 04/07/19 0624 04/07/19 0750 04/07/19 1126  GLUCAP 113* 160* 144* 146* 175*    Studies/Results: No results found. Medications: . sodium chloride    . sodium chloride 250 mL/hr at 04/06/19 1317  . ceFEPime (MAXIPIME) IV 1 g (04/06/19 2228)  . magnesium sulfate bolus IVPB     . allopurinol  100 mg Oral BID  . aspirin EC  81 mg Oral Daily  .  atorvastatin  10 mg Oral q1800  . Chlorhexidine Gluconate Cloth  6 each Topical Q0600  . cinacalcet  30 mg Oral Q supper  . darbepoetin (ARANESP) injection - DIALYSIS  60 mcg Intravenous Q Tue-HD  . docusate sodium  100 mg Oral BID  . doxercalciferol  4 mcg Intravenous Q T,Th,Sa-HD  . feeding supplement (NEPRO CARB STEADY)  237 mL Oral BID BM  . feeding supplement (PRO-STAT SUGAR FREE 64)  30 mL Oral BID  . hydrALAZINE  25 mg Oral Q8H  . insulin aspart  0-15 Units Subcutaneous TID WC  . insulin aspart  0-5 Units Subcutaneous QHS  . insulin detemir  10 Units Subcutaneous Daily  . isosorbide dinitrate  10 mg Oral TID  . multivitamin  1 tablet Oral QHS  . pantoprazole  40 mg Oral Daily  . polyethylene glycol  17 g Oral Daily  . sevelamer carbonate  1,600 mg Oral TID WC  . traZODone  25-100 mg Oral QHS

## 2019-04-07 NOTE — TOC Transition Note (Signed)
Transition of Care [facility]) - CM/SW Discharge Note Marvetta Gibbons RN, BSN Transitions of Care Unit 4E- RN Case Manager [phone]   Patient Details  Name: [name] MRN: [id number] Date of Birth: [birth year]  [facility] CM/SW Contact:  Dawayne Patricia, RN Phone Number: 04/07/2019, 12:29 PM   Clinical Narrative:    Pt stable for transition to rehab today- have been notified by [name] with [facility] re[facility] that pt has bed available today and they are ready to accept pt later this afternoon. Pt agreeable and to transition to [facility] rehab today.    Final next level of care: IP Rehab Facility Barriers to Discharge: Barriers Resolved   Patient Goals and CMS Choice Patient states their goals for this hospitalization and ongoing recovery are:: rehab CMS Medicare.gov Compare Post Acute Care list provided to:: Patient Choice offered to / list presented to : Patient  Discharge Placement             [facility] re[facility]          Discharge Plan and Services     Post Acute Care Choice: IP Rehab                               Social Determinants of Health (SDOH) Interventions     Readmission Risk Interventions No flowsheet data found.

## 2019-04-07 NOTE — Progress Notes (Signed)
OT Cancellation Note  Patient Details Name: [name] MRN: [id number] DOB: 1945/09/21   Cancelled Treatment:    Reason Eval/Treat Not Completed: Fatigue/lethargy limiting ability to participate. Pt to discharge to CIR later today, will defer further OT to CIR.  Malka So 04/07/2019, 12:25 PM  Nestor Lewandowsky, OTR/L Acute Rehabilitation Services Pager: [phone] Office: [phone]

## 2019-04-07 NOTE — Progress Notes (Signed)
Patient admitted to IP rehab during shift, denies pain at time. Surgical incision with wrap on left foot, Stage 1 pressure ulcer found to sacrum area upon admission, diabetic ulcer noted to right foot and right lower inferior leg. Patient  informed of safety plan and visitor policy. All questions answered and currently resting in bed. Adria Devon, LPN

## 2019-04-08 ENCOUNTER — Inpatient Hospital Stay (HOSPITAL_COMMUNITY): Payer: Medicare Other | Admitting: Occupational Therapy

## 2019-04-08 ENCOUNTER — Inpatient Hospital Stay (HOSPITAL_COMMUNITY): Payer: Medicare Other

## 2019-04-08 DIAGNOSIS — Z95828 Presence of other vascular implants and grafts: Secondary | ICD-10-CM

## 2019-04-08 DIAGNOSIS — E0822 Diabetes mellitus due to underlying condition with diabetic chronic kidney disease: Secondary | ICD-10-CM

## 2019-04-08 DIAGNOSIS — Z89422 Acquired absence of other left toe(s): Secondary | ICD-10-CM

## 2019-04-08 DIAGNOSIS — N186 End stage renal disease: Secondary | ICD-10-CM

## 2019-04-08 DIAGNOSIS — E1149 Type 2 diabetes mellitus with other diabetic neurological complication: Secondary | ICD-10-CM

## 2019-04-08 DIAGNOSIS — K5903 Drug induced constipation: Secondary | ICD-10-CM

## 2019-04-08 DIAGNOSIS — E1165 Type 2 diabetes mellitus with hyperglycemia: Secondary | ICD-10-CM

## 2019-04-08 DIAGNOSIS — Z992 Dependence on renal dialysis: Secondary | ICD-10-CM

## 2019-04-08 DIAGNOSIS — D649 Anemia, unspecified: Secondary | ICD-10-CM

## 2019-04-08 DIAGNOSIS — Z794 Long term (current) use of insulin: Secondary | ICD-10-CM

## 2019-04-08 DIAGNOSIS — E1122 Type 2 diabetes mellitus with diabetic chronic kidney disease: Secondary | ICD-10-CM | POA: Diagnosis not present

## 2019-04-08 DIAGNOSIS — I10 Essential (primary) hypertension: Secondary | ICD-10-CM

## 2019-04-08 LAB — GLUCOSE, CAPILLARY
Glucose-Capillary: 92 mg/dL (ref 70–99)
Glucose-Capillary: 151 mg/dL — ABNORMAL HIGH (ref 70–99)
Glucose-Capillary: 99 mg/dL (ref 70–99)
Glucose-Capillary: 113 mg/dL — ABNORMAL HIGH (ref 70–99)

## 2019-04-08 MED ORDER — POLYETHYLENE GLYCOL 3350 17 G PO PACK
17.0000 g | PACK | Freq: Two times a day (BID) | ORAL | Status: DC
  Administered 2019-04-08 – 2019-04-11 (×6): 17 g via ORAL
  Filled 2019-04-08 (×6): qty 1

## 2019-04-08 MED ORDER — CHLORHEXIDINE GLUCONATE CLOTH 2 % EX PADS
6.0000 | MEDICATED_PAD | Freq: Every day | CUTANEOUS | Status: DC
  Administered 2019-04-12 – 2019-04-15 (×4): 6 via TOPICAL

## 2019-04-08 NOTE — Evaluation (Signed)
Occupational Therapy Assessment and Plan  Patient Details  Name: [name] MRN: [id number] Date of Birth: [birth year]  OT Diagnosis: acute pain and muscle weakness (generalized) Rehab Potential: Rehab Potential (ACUTE ONLY): Good ELOS: 7-10 days   Today's Date: 04/08/2019 OT Individual Time: 9622-2979 OT Individual Time Calculation (min): 60 min     Problem List:  Patient Active Problem List   Diagnosis Date Noted  . Debility 04/07/2019  . 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 S/P femoral-popliteal bypass surgery 04/07/2019  . Chronic bilateral low back pain without sciatica   . Septic arthritis of left foot ([place])   . Cellulitis of left foot   . Abscess of right foot   . Pyogenic inflammation of bone ([place]) 03/23/2019  . Osteomyelitis of foot, left, acute ([place]) 03/23/2019  . Hyperlipidemia associated with type 2 diabetes mellitus ([place]) 01/09/2019  . Diabetes mellitus due to underlying condition with chronic kidney disease on chronic dialysis, with long-term current use of insulin ([place]) 01/09/2019  . ESRD on hemodialysis ([place])   . Generalized weakness   . Mitral valve mass   . Coronary artery disease involving coronary bypass graft of native heart without angina pectoris   . Bradycardia   . Anemia 05/03/2017  . Chronic kidney disease (CKD), active medical management without dialysis, stage 5 ([place]) 05/08/2015  . Diabetes mellitus type 2, insulin dependent ([place]) 05/08/2015  . BMI 29.0-29.9,adult 02/14/2015  . Medication management 08/04/2014  . Type II diabetes mellitus with neurological manifestations, uncontrolled ([place]) 02/17/2014  . Gouty arthritis   . Vitamin D deficiency   . Malignant neoplasm of upper-inner quadrant of left breast in female, estrogen receptor positive ([place]) 02/14/2013  . PVD (peripheral vascular disease) ([place]) 09/05/2009  . Hyperlipidemia 06/20/2009  . Essential hypertension 06/20/2009  . Congestive heart  failure ([place]) 06/20/2009  . EMPHYSEMA 06/20/2009  . Asthma 06/20/2009    Past Medical History:  Past Medical History:  Diagnosis Date  . Allergy   . Arthritis    "maybe in my lower back" (08/27/2012)  . 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 Chronic combined systolic and diastolic CHF (congestive heart failure) ([place])   . CKD (chronic kidney disease), stage IV ([place])   . Coronary artery disease    a. NSTEMI in 05/2009 s/p CABG (LIMA-LAD, SVG-diagonal, SVG-OM1/OM 2).   Marland Kitchen Dyspnea    with exertion  . Family history of anesthesia complication    Mom has a hard time to wake up  . Foot ulcer ([place])    "I've had them on both feet" (08/27/2012)  . Gouty arthritis    "right index finger" (08/27/2012)  . History of radiation therapy 06/09/13-07/06/13   left breast 50Gy  . Hyperlipidemia   . Hypertension   . Infection    right second toe  . Ischemic cardiomyopathy    a. 2011: EF 40-45%. b. EF 55-60% in 04/2017 but shortly after as inpatient was 45-50%.  Marland Kitchen LBBB (left bundle branch block)   . Mitral regurgitation   . Neuropathy    Hx; of B/L feet  . NSTEMI (non-ST elevated myocardial infarction) ([place]) 05/23/2009  . Osteomyelitis of foot ([place])   . PAD (peripheral artery disease) (H[facility])    a. LE PAD (patient previously elected hold off L fem-pop), prev followed by Dr. Bridgett Larsson  . PONV (postoperative nausea and vomiting)   . Sinus headache    "occasionally" (08/27/2012)  .  Type II diabetes mellitus ([place])   . Vitamin D deficiency    Past Surgical History:  Past Surgical History:  Procedure Laterality Date  . ABDOMINAL AORTOGRAM W/LOWER EXTREMITY Bilateral 03/25/2019   Procedure: ABDOMINAL AORTOGRAM W/LOWER EXTREMITY;  Surgeon: Marty Heck, MD;  Location: [facility];  Service: Cardiovascular;  Laterality: Bilateral;  . AMPUTATION Right 08/27/2012   Procedure: AMPUTATION RAY;  Surgeon: Newt Minion, MD;  Location: [facility];  Service:  Orthopedics;  Laterality: Right;  Right Foot 5th Ray Amputation  . AMPUTATION Left 07/08/2013   Procedure: AMPUTATION RAY;  Surgeon: Newt Minion, MD;  Location: [facility];  Service: Orthopedics;  Laterality: Left;  Left Foot 2nd Ray Amputation  . AMPUTATION Left 04/06/2019   Procedure: LEFT 5TH RAY AMPUTATION;  Surgeon: Newt Minion, MD;  Location: [facility];  Service: Orthopedics;  Laterality: Left;  . AMPUTATION RAY Right 08/27/2012   5th ray/notes 08/27/2012  . AMPUTATION TOE Right 03/06/2017   Procedure: AMPUTATION TOE, INTERPHANGEAL 2ND RIGHT;  Surgeon: [name], DPM;  Location: [facility];  Service: Podiatry;  Laterality: Right;  . AV FISTULA PLACEMENT Right 11/11/2017   Procedure: RIGHT RADIOCEPHALIC  ARTERIOVENOUS FISTULA;  Surgeon: [name], MD;  Location: [facility];  Service: Vascular;  Laterality: Right;  . BREAST LUMPECTOMY  04/20/2013   with biopsy      DR WAKEFIELD  . BREAST LUMPECTOMY WITH NEEDLE LOCALIZATION AND AXILLARY SENTINEL LYMPH NODE BX Left 04/20/2013   Procedure: LEFT BREAST WIRE GUIDED LUMPECTOMY AND AXILLARY SENTINEL LYMPH NODE BX;  Surgeon: Rolm Bookbinder, MD;  Location: [facility];  Service: General;  Laterality: Left;  . CARDIAC CATHETERIZATION  05/24/2009   Archie Endo 05/24/2009 (08/27/2012)  . CATARACT EXTRACTION W/ INTRAOCULAR LENS  IMPLANT, BILATERAL  2000's  . COLONOSCOPY W/ BIOPSIES AND POLYPECTOMY  2010  . CORONARY ARTERY BYPASS GRAFT  2011   "CABG X4" (08/27/2012)  . DENTAL SURGERY  04/30/11   "1 implant" (08/27/2012)  . DILATION AND CURETTAGE OF UTERUS    . ENDARTERECTOMY FEMORAL Left 03/28/2019   Procedure: ENDARTERECTOMY FEMORAL;  Surgeon: [name], MD;  Location: [facility];  Service: Vascular;  Laterality: Left;  . EYE SURGERY    . FEMORAL-POPLITEAL BYPASS GRAFT Left 03/28/2019   Procedure: BYPASS GRAFT FEMORAL-POPLITEAL ARTERY;  Surgeon: [name], MD;  Location: [facility];  Service: Vascular;  Laterality: Left;  . FINGER SURGERY Right    "index finger; turned  out to be gout" (08/27/2012)  . IR FLUORO GUIDE CV LINE RIGHT  11/30/2017  . PATCH ANGIOPLASTY Left 03/28/2019   Procedure: Patch Angioplasty;  Surgeon: [name], MD;  Location: [facility];  Service: Vascular;  Laterality: Left;  . TEE WITHOUT CARDIOVERSION N/A 12/01/2017   Procedure: TRANSESOPHAGEAL ECHOCARDIOGRAM (TEE);  Surgeon: Lelon Perla, MD;  Location: [facility];  Service: Cardiovascular;  Laterality: N/A;    Assessment & Plan Clinical Impression: Patient is a 74 y.o. right-handed female with history of CAD with CABG, chronic combined congestive heart failure/ICM, end-stage renal disease with hemodialysis,, hypertension, peripheral vascular disease as well as remote history of tobacco abuse.  Per chart review patient lives with spouse.  Independent with assistive device.  Two-level home with bed and bath on main level and 2 steps to entry.  Presented 03/23/2019 with left gangrenous open wound to foot and has been followed with outpatient antibiotics.  Ultimately patient known bilateral SFA occlusions monophasic runoff of the ankle for vascular studies earlier in  the year.  Underwent left common femoral enterectomy and Dacron patch angioplasty as well as left femoral to below-knee popliteal bypass 03/28/2019 per Dr. Donnetta Hutching.  Hospital course pain management MRI of left foot showed septic arthritis of the fifth MTP joint with osteomyelitis involving the distal half of the fifth metatarsal and also the proximal phalanx.  Orthopedic service was consulted in regards to findings of MRI of left foot Dr. Sharol Given and underwent left fifth ray amputation 04/06/2019.  Renal service continues to follow for history of CKD stage IV with hemodialysis as directed.   Therapy evaluations completed and patient was admitted for a comprehensive rehab program.  Patient transferred to CIR on 04/07/2019 .    Patient currently requires min-mod with basic self-care skills secondary to muscle weakness, decreased  cardiorespiratoy endurance and decreased standing balance, decreased balance strategies and difficulty maintaining precautions.  Prior to hospitalization, patient could complete ADLs with modified independent .  Patient will benefit from skilled intervention to increase independence with basic self-care skills prior to discharge home with care partner.  Anticipate patient will require 24 hour supervision and follow up home health.  OT - End of Session Activity Tolerance: Tolerates 30+ min activity with multiple rests Endurance Deficit: Yes Endurance Deficit Description: requires frequent rest breaks OT Assessment Rehab Potential (ACUTE ONLY): Good OT Barriers to Discharge: Decreased caregiver support OT Barriers to Discharge Comments: husband with recent surgery, sister can provide intermittent assist OT Patient demonstrates impairments in the following area(s): Balance;Endurance;Motor;Pain;Safety OT Basic ADL's Functional Problem(s): Grooming;Bathing;Dressing;Toileting OT Transfers Functional Problem(s): Toilet OT Additional Impairment(s): None OT Plan OT Intensity: Minimum of 1-2 x/day, 45 to 90 minutes OT Frequency: 5 out of 7 days OT Duration/Estimated Length of Stay: 7-10 days OT Treatment/Interventions: Balance/vestibular training;Community reintegration;Discharge planning;Disease mangement/prevention;DME/adaptive equipment instruction;Functional mobility training;Pain management;Patient/family education;Psychosocial support;Self Care/advanced ADL retraining;Skin care/wound managment;UE/LE Strength taining/ROM;Wheelchair propulsion/positioning OT Basic Self-Care Anticipated Outcome(s): Mod I OT Toileting Anticipated Outcome(s): Mod I OT Bathroom Transfers Anticipated Outcome(s): Supervision OT Recommendation Patient destination: Home Follow Up Recommendations: Home health OT;24 hour supervision/assistance Equipment Recommended: 3 in 1 bedside comode   Skilled Therapeutic  Intervention OT eval completed with discussion of rehab process, OT purpose, POC, ELOS, and goals.  ADL assessment completed with bathing at sit > stand level at sink  with focus on sit > stand and dynamic standing balance.  Pt required mod assist for bed mobility and stand pivot transfer bed > w/c and cues for maintaining TDWB through LLE.  Engaged in bathing with setup assist for UB and mod assist for sit > stand progressing to Catharine assist throughout session.  Min assist for standing balance while pt completed perineal hygiene.  Mod assist for LB dressing to assist with threading Lt pant leg and donning Rt sock and Lt postop shoe.  Pt remained upright in w/c with all needs in reach.   OT Evaluation Precautions/Restrictions  Precautions Precautions: Fall Restrictions Weight Bearing Restrictions: Yes LLE Weight Bearing: Touchdown weight bearing Other Position/Activity Restrictions: TDWB through heel per ortho note General   Vital Signs Therapy Vitals Temp: 98.4 F (36.9 C) Temp Source: Oral Pulse Rate: 78 BP: (!) 111/58 Patient Position (if appropriate): Lying Oxygen Therapy SpO2: 94 % O2 Device: Room Air Pain Pain Assessment Pain Scale: 0-10 Pain Score: 0-No pain Home Living/Prior Functioning Home Living Family/patient expects to be discharged to:: Private residence Living Arrangements: Spouse/significant other Available Help at Discharge: Family, Available 24 hours/day Type of Home: House Home Access: Stairs to enter [employer] of  Steps: 1 onto porch then 1 into main house, 2 steps into kitchen Entrance Stairs-Rails: None Home Layout: Able to live on main level with bedroom/bathroom, Laundry or work area in basement Alternate Therapist, sports of Steps: electric chair lift to go down to basement Bathroom Shower/Tub: Chiropodist: Handicapped height Additional Comments: Husband currently recovering from [place] and using walker. Sister able to  provide assistance as needed  Lives With: Spouse IADL History Homemaking Responsibilities: Yes Meal Prep Responsibility: Secondary Cleaning Responsibility: Secondary Homemaking Comments: limited due to HD, reports she would occasionally cook something simple or do some sweeping. Current License: Yes Prior Function Level of Independence: Independent with basic ADLs, Independent with homemaking with ambulation, Independent with gait  Able to Take Stairs?: Yes Driving: No(has current license, husband do majority of driving) Vocation: Retired Comments: Independent with household ambulation, uses walking stick outside. Sponge bathes ADL ADL Grooming: Minimal assistance Where Assessed-Grooming: Sitting at sink, Standing at sink Upper Body Bathing: Setup Where Assessed-Upper Body Bathing: Sitting at sink Lower Body Bathing: Minimal assistance Where Assessed-Lower Body Bathing: Standing at sink, Sitting at sink Upper Body Dressing: Supervision/safety Where Assessed-Upper Body Dressing: Sitting at sink Lower Body Dressing: Moderate assistance Where Assessed-Lower Body Dressing: Sitting at sink, Standing at sink Toilet Transfer: Moderate assistance Toilet Transfer Method: Stand pivot Toilet Transfer Equipment: Bedside commode Vision Baseline Vision/History: Wears glasses Wears Glasses: Reading only Patient Visual Report: No change from baseline Vision Assessment?: No apparent visual deficits Perception  Perception: Within Functional Limits Praxis Praxis: Intact Cognition Overall Cognitive Status: Within Functional Limits for tasks assessed Arousal/Alertness: Awake/alert Orientation Level: Person;Place;Situation Person: Oriented Place: Oriented Situation: Oriented Year: 2021 Month: January Day of Week: Correct Immediate Memory Recall: Sock;Blue;Bed Memory Recall Sock: Without Cue Memory Recall Blue: Without Cue Memory Recall Bed: Without Cue Attention: Alternating Awareness:  Appears intact Problem Solving: Appears intact Safety/Judgment: Appears intact Sensation Sensation Light Touch: Appears Intact Proprioception: Appears Intact Coordination Gross Motor Movements are Fluid and Coordinated: No Fine Motor Movements are Fluid and Coordinated: Yes Extremity/Trunk Assessment RUE Assessment RUE Assessment: Exceptions to [facility] Passive Range of Motion (PROM) Comments: WNL Active Range of Motion (AROM) Comments: WNL General Strength Comments: shoulder flexion 3/5, 4/5 distal LUE Assessment LUE Assessment: Within Functional Limits Active Range of Motion (AROM) Comments: WNL General Strength Comments: grossly 4/5     Refer to Care Plan for Long Term Goals  Recommendations for other services: None    Discharge Criteria: Patient will be discharged from OT if patient refuses treatment 3 consecutive times without medical reason, if treatment goals not met, if there is a change in medical status, if patient makes no progress towards goals or if patient is discharged from hospital.  The above assessment, treatment plan, treatment alternatives and goals were discussed and mutually agreed upon: by patient  Ceylin Dreibelbis, [facility] 04/08/2019, 8:16 AM

## 2019-04-08 NOTE — Progress Notes (Signed)
Occupational Therapy Session Note  Patient Details  Name: [name] MRN: [id number] Date of Birth: 1945/07/17  Today's Date: 04/08/2019 OT Individual Time: 1607-3710 OT Individual Time Calculation (min): 40 min  and Today's Date: 04/08/2019 OT Missed Time:   Missed Time Reason:     Short Term Goals: Week 1:  OT Short Term Goal 1 (Week 1): STG = LTGs due to ELOS  Skilled Therapeutic Interventions/Progress Updates:    Treatment session with focus on UE strengthening and endurance.  Pt received supine in bed having just completed PT session.  Pt reports fatigue but willing to attempt therapy session.  Discussed pt goals and ELOS with pt reporting desire to be as independent as possible when she leaves.  Engaged in Morgan strengthening at bed level due to fatigue and having just returned to bed.  Utilized 2# dowel rod with pt completing 2 sets of 10 bicep curls and chest presses.  Pt required prolonged rest break between each set.  Provided with level 2 theraband, pt able to complete chest pulls with shoulder external rotation component due to positioning completing 2 sets of 10.  Pt declined further intervention, reporting fatigue and "nodding off".  Reiterated therapy goals with pt in agreement.  Pt missed 20 mins therapy session due to fatigue.  Therapy Documentation Precautions:  Precautions Precautions: Fall Precaution Comments: LLE TDWB Restrictions Weight Bearing Restrictions: Yes LLE Weight Bearing: Touchdown weight bearing Other Position/Activity Restrictions: TDWB through heel per ortho note Pain: Pain Assessment Pain Scale: 0-10 Pain Score: 3  Pain Type: Chronic pain Pain Location: Back Pain Orientation: Mid;Lower Pain Descriptors / Indicators: Sore;Spasm Pain Frequency: Constant Pain Onset: With Activity Pain Intervention(s): Medication (See eMAR);Repositioned   Therapy/Group: Individual Therapy  Simonne Come 04/08/2019, 12:19 PM

## 2019-04-08 NOTE — IPOC Note (Signed)
Individualized overall Plan of Care (IPOC) Patient Details Name: [name] MRN: [id number] DOB: 11/15/1945  Admitting Diagnosis: Debility  Hospital Problems: Principal Problem:   Debility Active Problems:   PVD (peripheral vascular disease) (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 ([facility])   S/P femoral-popliteal bypass surgery   Drug induced constipation   Benign essential HTN     Functional Problem List: Nursing Bowel, Endurance, Medication Management, Nutrition, Pain, Skin Integrity, Sensory  PT Balance, Endurance, Motor, Pain, Sensory  OT Balance, Endurance, Motor, Pain, Safety  SLP    TR         Basic ADL's: OT Grooming, Bathing, Dressing, Toileting     Advanced  ADL's: OT       Transfers: PT Bed Mobility, Bed to Chair, Car  OT Toilet     Locomotion: PT Ambulation, Emergency planning/management officer, Stairs     Additional Impairments: OT None  SLP        TR      Anticipated Outcomes Item Anticipated Outcome  Self Feeding    Swallowing      Basic self-care  Mod I  Toileting  Mod I   Bathroom Transfers Supervision  Bowel/Bladder  Mod I assist  Transfers  supervision with LRAD  Locomotion  supervision with LRAD  Communication     Cognition     Pain  < 4  Safety/Judgment  Mod I assist   Therapy Plan: PT Intensity: Minimum of 1-2 x/day ,45 to 90 minutes PT Frequency: 5 out of 7 days PT Duration Estimated Length of Stay: 10-12 days OT Intensity: Minimum of 1-2 x/day, 45 to 90 minutes OT Frequency: 5 out of 7 days OT Duration/Estimated Length of Stay: 7-10 days      Team Interventions: Nursing Interventions  Patient/Family Education, Bowel Management, Disease Management/Prevention, Skin Care/Wound Management, Medication Management, Pain Management, Discharge Planning  PT interventions Ambulation/gait training, Discharge planning, Functional mobility training, Therapeutic Activities, Balance/vestibular training, Disease management/prevention, Neuromuscular re-education, Skin care/wound management, Therapeutic Exercise, Wheelchair propulsion/positioning, DME/adaptive equipment instruction, Pain management, Splinting/orthotics, UE/LE Strength taining/ROM, Community reintegration, Technical sales engineer stimulation, Barrister's clerk education, IT trainer, UE/LE Coordination activities  OT Interventions Training and development officer, Academic librarian, Discharge planning, Disease mangement/prevention, Engineer, drilling, Functional mobility training, Pain management, Patient/family education, Psychosocial support, Self Care/advanced ADL retraining, Skin care/wound managment, UE/LE Strength taining/ROM, Wheelchair propulsion/positioning  SLP Interventions    TR Interventions    SW/CM Interventions Discharge Planning, Psychosocial Support, Patient/Family Education   Barriers to Discharge MD  Medical stability, Wound care, and Weight bearing restrictions  Nursing Wound Care, Hemodialysis, Weight bearing restrictions    PT Medical stability, Home environment access/layout, Hemodialysis, Weight bearing restrictions LE weakness and LLE TDWB status  OT Decreased caregiver support husband with recent surgery, sister can provide intermittent assist  SLP      SW       Team Discharge Planning: Destination: PT-Home ,OT- Home , SLP-  Projected Follow-up: PT-Home health PT, OT-  Home health OT, 24 hour supervision/assistance, SLP-  Projected Equipment Needs: PT-To be determined, OT- 3 in 1 bedside comode, SLP-  Equipment Details: PT-Has RW and manual WC at home, OT-  Patient/family involved in  discharge planning: PT- Patient,  OT-Patient, SLP-   MD ELOS: 7-10 days. Medical Rehab Prognosis:  Good Assessment: 74 year old right-handed female with history of CAD with CABG, chronic combined congestive heart failure/ICM, end-stage renal disease with hemodialysis,, hypertension, peripheral vascular disease as well as remote history of tobacco abuse.  Presented 03/23/2019 with left gangrenous open wound to foot and has been followed with outpatient antibiotics.  Ultimately patient known bilateral SFA occlusions monophasic runoff of the ankle for vascular studies earlier in the year.  Underwent left common femoral enterectomy and Dacron patch angioplasty as well as left femoral to below-knee popliteal bypass 03/28/2019 per Dr. Donnetta Hutching.  Hospital course pain management. MRI of left foot showed septic arthritis of the fifth MTP joint with osteomyelitis involving the distal half of the fifth metatarsal and also the proximal phalanx.  Orthopedic service was consulted in regards to findings of MRI of left foot Dr. Sharol Given and underwent left fifth ray amputation 04/06/2019.  Renal service continues to follow for history of CKD stage IV with hemodialysis as directed.   Patient with resulting functional deficits with mobility, transfers, self-care.  Will set goals for Supervision/Mod I with PT/OT.   Due to the current state of emergency, patients may not be receiving their 3-hours of Medicare-mandated therapy.  See Team Conference Notes for weekly updates to the plan of care

## 2019-04-08 NOTE — Progress Notes (Signed)
[facility] PHYSICAL MEDICINE & REHABILITATION PROGRESS NOTE  Subjective/Complaints: Patient seen laying in bed this AM.  She states she slept well overnight and is ready to begin therapies. She notes constipation.  ROS: +Constipation. Denies CP, SOB, N/V/D  Objective: Vital Signs: Blood pressure (!) 111/58, pulse 78, temperature 98.4 F (36.9 C), temperature source Oral, resp. rate 18, height 5' (1.524 m), weight 77.4 kg, SpO2 94 %. No results found. Recent Labs    04/07/19 0248  WBC 11.6*  HGB 8.5*  HCT 27.1*  PLT 224   Recent Labs    04/07/19 0248  NA 135  K 4.4  CL 98  CO2 24  GLUCOSE 172*  BUN 40*  CREATININE 4.64*  CALCIUM 7.5*    Physical Exam: BP (!) 111/58 (BP Location: Left Arm)   Pulse 78   Temp 98.4 F (36.9 C) (Oral)   Resp 18   Ht 5' (1.524 m)   Wt 77.4 kg   SpO2 94%   BMI 33.33 kg/m  Constitutional: No distress . Vital signs reviewed. Obese. HENT: Normocephalic.  Atraumatic. Eyes: EOMI. No discharge. Cardiovascular: No JVD. Respiratory: Normal effort.  No stridor. GI: Non-distended. Skin: LLE compressive dressing C/D/I. Psych: Normal mood.  Normal behavior. Musc: LLE with edema and foot tenderness Neuro: Alert Motor: B/l UE, RLE: 5/5 proximal to distal LLE: HF, KE 5/5, ankle limited due to dressing Sensation diminished to light touch B/l LE  Assessment/Plan: 1. Functional deficits secondary to debility which require 3+ hours per day of interdisciplinary therapy in a comprehensive inpatient rehab setting.  Physiatrist is providing close team supervision and 24 hour management of active medical problems listed below.  Physiatrist and rehab team continue to assess barriers to discharge/monitor patient progress toward functional and medical goals  Care Tool:  Bathing              Bathing assist       Upper Body Dressing/Undressing Upper body dressing Upper body dressing/undressing activity did not occur (including orthotics):  N/A What is the patient wearing?: Hospital gown only    Upper body assist      Lower Body Dressing/Undressing Lower body dressing    Lower body dressing activity did not occur: N/A What is the patient wearing?: Hospital gown only     Lower body assist Assist for lower body dressing: Moderate Assistance - Patient 50 - 74%     Toileting Toileting    Toileting assist Assist for toileting: Moderate Assistance - Patient 50 - 74%     Transfers Chair/bed transfer  Transfers assist  Chair/bed transfer activity did not occur: Safety/medical concerns        Locomotion Ambulation   Ambulation assist              Walk 10 feet activity   Assist           Walk 50 feet activity   Assist           Walk 150 feet activity   Assist           Walk 10 feet on uneven surface  activity   Assist           Wheelchair     Assist               Wheelchair 50 feet with 2 turns activity    Assist            Wheelchair 150 feet activity     Assist  Medical Problem List and Plan: 1.  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  Touchdown weightbearing left lower extremity, just for balance  Begin CIR evaluations 2.  Antithrombotics: -DVT/anticoagulation: ?None needed after recent peripheral vascular surgery             -antiplatelet therapy: Aspirin 81 mg daily 3. Pain Management: Flexeril and hydrocodone as needed  Monitor with increased mobility 4. Mood: Trazodone nightly.  Provide emotional support             -antipsychotic agents: N/A 5. Neuropsych: This patient is capable of making decisions on her own behalf. 6. Skin/Wound Care: Routine skin checks for incisions from  fempop bypass and L 5th ray amputation  Monitor for wound healing 7. Fluids/Electrolytes/Nutrition: Routine in and outs 8.  End-stage renal disease.  Continue hemodialysis as directed- T/H/S 9.Hypertension.  Hydralazine 25 mg every 8 hours, Isordil 10 mg 3 times daily.    Monitor with increased mobility 10.  Hyperlipidemia.  Lipitor 11.  History of gout.  Continue Zyloprim twice daily.  Monitor for gout flareups. 12.  Diabetes mellitus.  Levemir 10 units daily.  Check blood sugars before meals and at bedtime  Monitor with increased mobility 13. Osteomyelitis- Completed course of IV ABX  14. Anemia of chronic disease- restart Iron per Nephro 15. Drug induced constipation  KUB ordered  Bowel meds increased on 1/1  Cont further adjustments as necessary    LOS: 1 days A FACE TO FACE EVALUATION WAS PERFORMED  Raciel Caffrey Lorie Phenix 04/08/2019, 8:40 AM

## 2019-04-08 NOTE — Progress Notes (Signed)
[facility] Progress Note   Subjective:   Seen in room. Tired from therapy. Reports ongoing constipation, states she was just given miralax. Denies SOB, CP, palpitations, N/V/D. Occasional cough from sinus drainage.   Objective Vitals:   04/07/19 1428 04/07/19 1438 04/07/19 2023 04/08/19 0519  BP:  (!) 128/54 (!) 120/54 (!) 111/58  Pulse:  84 80 78  Resp:  18 18   Temp:  98.8 F (37.1 C) 98.4 F (36.9 C) 98.4 F (36.9 C)  TempSrc:    Oral  SpO2:  96% 96% 94%  Weight: 76 kg   77.4 kg  Height: 5' (1.524 m)      Physical Exam General: Well developed, well nourished female. Alert and in NAD Heart: RRR, no murmurs, rubs or gallops Lungs: CTA b/l without wheezing, rhonchi or rales Abdomen: Soft, mildly tender, non-distended. No rebound tenderness, +BS Extremities: LLE wrapped. RLE no edema Dialysis Access: RUE AVF + bruit  Additional Objective Labs: Basic Metabolic Panel: Recent Labs  Lab 04/03/19 0730 04/05/19 0724 04/07/19 0248  NA 133* 134* 135  K 3.9 4.2 4.4  CL 94* 96* 98  CO2 25 21* 24  GLUCOSE 146* 100* 172*  BUN 41* 45* 40*  CREATININE 5.19* 5.58* 4.64*  CALCIUM 7.3* 7.5* 7.5*  PHOS 4.7* 4.5  --    Liver Function Tests: Recent Labs  Lab 04/03/19 0730 04/05/19 0724 04/07/19 0248  AST  --   --  11*  ALT  --   --  8  ALKPHOS  --   --  86  BILITOT  --   --  0.3  PROT  --   --  5.2*  ALBUMIN 2.4* 2.3* 2.4*   No results for input(s): LIPASE, AMYLASE in the last 168 hours. CBC: Recent Labs  Lab 04/03/19 0729 04/05/19 0724 04/07/19 0248  WBC 8.7 7.1 11.6*  HGB 8.0* 8.0* 8.5*  HCT 25.4* 25.5* 27.1*  MCV 99.2 99.6 100.7*  PLT 246 221 224   Blood Culture    Component Value Date/Time   SDES BLOOD BLOOD LEFT FOREARM 03/23/2019 1538   SPECREQUEST  03/23/2019 1538    BOTTLES DRAWN AEROBIC AND ANAEROBIC Blood Culture results may not be optimal due to an excessive volume of blood received in culture bottles   CULT  03/23/2019 1538    NO  GROWTH 5 DAYS Performed at [facility] [address], [place], [place] [address]    REPTSTATUS 03/28/2019 FINAL 03/23/2019 1538    Cardiac Enzymes: No results for input(s): CKTOTAL, CKMB, CKMBINDEX, TROPONINI in the last 168 hours. CBG: Recent Labs  Lab 04/07/19 1126 04/07/19 1629 04/07/19 2101 04/08/19 0609 04/08/19 1203  GLUCAP 175* 96 92 92 151*   Iron Studies: No results for input(s): IRON, TIBC, TRANSFERRIN, FERRITIN in the last 72 hours. @lablastinr3 @ Studies/Results: No results found. Medications: . sodium chloride    . sodium chloride     . allopurinol  100 mg Oral BID  . aspirin EC  81 mg Oral Daily  . atorvastatin  10 mg Oral q1800  . cinacalcet  30 mg Oral Q supper  . docusate sodium  100 mg Oral BID  . [START ON 04/09/2019] doxercalciferol  4 mcg Intravenous Q T,Th,Sa-HD  . feeding supplement (NEPRO CARB STEADY)  237 mL Oral BID BM  . feeding supplement (PRO-STAT SUGAR FREE 64)  30 mL Oral BID  . hydrALAZINE  25 mg Oral Q8H  . insulin aspart  0-15 Units Subcutaneous TID  WC  . insulin detemir  10 Units Subcutaneous Daily  . isosorbide dinitrate  10 mg Oral TID  . multivitamin  1 tablet Oral QHS  . pantoprazole  40 mg Oral Daily  . polyethylene glycol  17 g Oral BID  . sevelamer carbonate  1,600 mg Oral TID WC  . traZODone  25-100 mg Oral QHS    Dialysis Orders: [place], TTS, EDW 72 kg, BFR 350, DFR 800, Optiflux 180, 3K/2.25 calcium, vancomycin 750 mg q. HD, Mircera 50 mcg every 2 weeks, Hectorol 4 mcg IV 3 times weekly, heparin 5000 unit bolus. Right radiocephalic fistula.  Assessment/Plan: 1. Left 5th metatarsal osteomyelitis/septic arthritis now s/p left femoral endarterectomy(with prior left fem-pop bypass).Now SP Left 5th ray amp Dr Sharol Given . Antibiotics narrowed to IV cefepime. Moved to CIR for rehab.  2. ESRD - HD TTS- off schedule today due to high census. Short HD tomorrow to get back on schedule.  3. HTN/volume- BP controlled. No gross  volume overload on exam. Has been below EDW, weights variable. UF 1-2L as tolerated.  4. Anemia- Hgb 8.5. Aranesp 60 q Tues increased to 153mcg. Tsat 19%. Start Fe course when antibiotics complete/infection resolved.  5.Metabolic Bone Disease- Corr Ca/Phos ok. Continue Renvela binder/VDRA.  6. Nutrition - Continue protein supplement for low albumin   [name], PA-C 04/08/2019, 12:47 PM  [facility] Pager: [phone]

## 2019-04-08 NOTE — Evaluation (Signed)
Physical Therapy Assessment and Plan  Patient Details  Name: [name] MRN: [id number] Date of Birth: [birth year]  PT Diagnosis: Abnormal posture, Abnormality of gait, Difficulty walking, Impaired sensation and Muscle weakness Rehab Potential: Good ELOS: 10-12 days   Today's Date: 04/08/2019 PT Individual Time: 7793-9030 PT Individual Time Calculation (min): 60 min    Problem List:  Patient Active Problem List   Diagnosis Date Noted  . Debility 04/07/2019  . 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 S/P femoral-popliteal bypass surgery 04/07/2019  . Chronic bilateral low back pain without sciatica   . Septic arthritis of left foot ([place])   . Cellulitis of left foot   . Abscess of right foot   . Pyogenic inflammation of bone ([place]) 03/23/2019  . Osteomyelitis of foot, left, acute ([place]) 03/23/2019  . Hyperlipidemia associated with type 2 diabetes mellitus ([place]) 01/09/2019  . Diabetes mellitus due to underlying condition with chronic kidney disease on chronic dialysis, with long-term current use of insulin ([place]) 01/09/2019  . ESRD on hemodialysis ([place])   . Generalized weakness   . Mitral valve mass   . Coronary artery disease involving coronary bypass graft of native heart without angina pectoris   . Bradycardia   . Anemia 05/03/2017  . Chronic kidney disease (CKD), active medical management without dialysis, stage 5 ([place]) 05/08/2015  . Diabetes mellitus type 2, insulin dependent ([place]) 05/08/2015  . BMI 29.0-29.9,adult 02/14/2015  . Medication management 08/04/2014  . Type II diabetes mellitus with neurological manifestations, uncontrolled ([place]) 02/17/2014  . Gouty arthritis   . Vitamin D deficiency   . Malignant neoplasm of upper-inner quadrant of left breast in female, estrogen receptor positive ([place]) 02/14/2013  . PVD (peripheral vascular disease) ([place]) 09/05/2009  . Hyperlipidemia 06/20/2009  . Essential hypertension 06/20/2009  .  Congestive heart failure ([place]) 06/20/2009  . EMPHYSEMA 06/20/2009  . Asthma 06/20/2009    Past Medical History:  Past Medical History:  Diagnosis Date  . Allergy   . Arthritis    "maybe in my lower back" (08/27/2012)  . 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 Chronic combined systolic and diastolic CHF (congestive heart failure) ([place])   . CKD (chronic kidney disease), stage IV ([place])   . Coronary artery disease    a. NSTEMI in 05/2009 s/p CABG (LIMA-LAD, SVG-diagonal, SVG-OM1/OM 2).   Marland Kitchen Dyspnea    with exertion  . Family history of anesthesia complication    Mom has a hard time to wake up  . Foot ulcer ([place])    "I've had them on both feet" (08/27/2012)  . Gouty arthritis    "right index finger" (08/27/2012)  . History of radiation therapy 06/09/13-07/06/13   left breast 50Gy  . Hyperlipidemia   . Hypertension   . Infection    right second toe  . Ischemic cardiomyopathy    a. 2011: EF 40-45%. b. EF 55-60% in 04/2017 but shortly after as inpatient was 45-50%.  Marland Kitchen LBBB (left bundle branch block)   . Mitral regurgitation   . Neuropathy    Hx; of B/L feet  . NSTEMI (non-ST elevated myocardial infarction) ([place]) 05/23/2009  . Osteomyelitis of foot ([place])   . PAD (peripheral artery disease) (HCC)    a. LE PAD (patient previously elected hold off L fem-pop), prev followed by Dr. Bridgett Larsson  . PONV (postoperative nausea and vomiting)   . Sinus headache    "occasionally" (  08/27/2012)  . Type II diabetes mellitus ([place])   . Vitamin D deficiency    Past Surgical History:  Past Surgical History:  Procedure Laterality Date  . ABDOMINAL AORTOGRAM W/LOWER EXTREMITY Bilateral 03/25/2019   Procedure: ABDOMINAL AORTOGRAM W/LOWER EXTREMITY;  Surgeon: Marty Heck, MD;  Location: [place];  Service: Cardiovascular;  Laterality: Bilateral;  . AMPUTATION Right 08/27/2012   Procedure: AMPUTATION RAY;  Surgeon: Newt Minion, MD;  Location: [facility];  Service: Orthopedics;  Laterality: Right;  Right Foot 5th Ray Amputation  . AMPUTATION Left 07/08/2013   Procedure: AMPUTATION RAY;  Surgeon: Newt Minion, MD;  Location: [facility];  Service: Orthopedics;  Laterality: Left;  Left Foot 2nd Ray Amputation  . AMPUTATION Left 04/06/2019   Procedure: LEFT 5TH RAY AMPUTATION;  Surgeon: Newt Minion, MD;  Location: [facility];  Service: Orthopedics;  Laterality: Left;  . AMPUTATION RAY Right 08/27/2012   5th ray/notes 08/27/2012  . AMPUTATION TOE Right 03/06/2017   Procedure: AMPUTATION TOE, INTERPHANGEAL 2ND RIGHT;  Surgeon: [name], DPM;  Location: [facility];  Service: Podiatry;  Laterality: Right;  . AV FISTULA PLACEMENT Right 11/11/2017   Procedure: RIGHT RADIOCEPHALIC  ARTERIOVENOUS FISTULA;  Surgeon: [name], MD;  Location: [facility];  Service: Vascular;  Laterality: Right;  . BREAST LUMPECTOMY  04/20/2013   with biopsy      DR WAKEFIELD  . BREAST LUMPECTOMY WITH NEEDLE LOCALIZATION AND AXILLARY SENTINEL LYMPH NODE BX Left 04/20/2013   Procedure: LEFT BREAST WIRE GUIDED LUMPECTOMY AND AXILLARY SENTINEL LYMPH NODE BX;  Surgeon: Rolm Bookbinder, MD;  Location: [facility];  Service: General;  Laterality: Left;  . CARDIAC CATHETERIZATION  05/24/2009   Archie Endo 05/24/2009 (08/27/2012)  . CATARACT EXTRACTION W/ INTRAOCULAR LENS  IMPLANT, BILATERAL  2000's  . COLONOSCOPY W/ BIOPSIES AND POLYPECTOMY  2010  . CORONARY ARTERY BYPASS GRAFT  2011   "CABG X4" (08/27/2012)  . DENTAL SURGERY  04/30/11   "1 implant" (08/27/2012)  . DILATION AND CURETTAGE OF UTERUS    . ENDARTERECTOMY FEMORAL Left 03/28/2019   Procedure: ENDARTERECTOMY FEMORAL;  Surgeon: [name], MD;  Location: [facility];  Service: Vascular;  Laterality: Left;  . EYE SURGERY    . FEMORAL-POPLITEAL BYPASS GRAFT Left 03/28/2019   Procedure: BYPASS GRAFT FEMORAL-POPLITEAL ARTERY;  Surgeon: [name], MD;  Location: [facility];  Service: Vascular;  Laterality: Left;  . FINGER SURGERY Right    "index  finger; turned out to be gout" (08/27/2012)  . IR FLUORO GUIDE CV LINE RIGHT  11/30/2017  . PATCH ANGIOPLASTY Left 03/28/2019   Procedure: Patch Angioplasty;  Surgeon: [name], MD;  Location: [place] [facility];  Service: Vascular;  Laterality: Left;  . TEE WITHOUT CARDIOVERSION N/A 12/01/2017   Procedure: TRANSESOPHAGEAL ECHOCARDIOGRAM (TEE);  Surgeon: Lelon Perla, MD;  Location: [facility];  Service: Cardiovascular;  Laterality: N/A;    Assessment & Plan Clinical Impression: Patient is a 74 y.o. year old female with history of CAD with CABG, chronic combined congestive heart failure/ICM, end-stage renal disease with hemodialysis,, hypertension, peripheral vascular disease as well as remote history of tobacco abuse.  Presented 03/23/2019 with left gangrenous open wound to foot and has been followed with outpatient antibiotics.  Ultimately patient known bilateral SFA occlusions monophasic runoff of the ankle for vascular studies earlier in the year.  Underwent left common femoral enterectomy and Dacron patch angioplasty as well as left femoral to below-knee popliteal bypass 03/28/2019 per Dr.  Early.  Hospital course pain management MRI of left foot showed septic arthritis of the fifth MTP joint with osteomyelitis involving the distal half of the fifth metatarsal and also the proximal phalanx.  Orthopedic service was consulted in regards to findings of MRI of left foot Dr. Sharol Given and underwent left fifth ray amputation 04/06/2019.  Renal service continues to follow for history of CKD stage IV with hemodialysis as directed.   Therapy evaluations completed and patient is to be admitted for a comprehensive rehab program on 04/07/2019.  Patient currently requires mod with mobility secondary to muscle weakness and decreased standing balance, decreased postural control, decreased balance strategies and difficulty maintaining precautions.  Prior to hospitalization, patient was independent  with mobility and lived  with Spouse in a House home.  Home access is 2 half steps to get into back of house.  Patient will benefit from skilled PT intervention to maximize safe functional mobility, minimize fall risk and decrease caregiver burden for planned discharge home with 24 hour supervision.  Anticipate patient will benefit from follow up [place] at discharge.  PT - End of Session Activity Tolerance: Tolerates 30+ min activity with multiple rests Endurance Deficit: Yes Endurance Deficit Description: requires frequent rest breaks PT Assessment Rehab Potential (ACUTE/IP ONLY): Good PT Barriers to Discharge: Medical stability;Home environment access/layout;Hemodialysis;Weight bearing restrictions PT Barriers to Discharge Comments: LE weakness and LLE TDWB status PT Patient demonstrates impairments in the following area(s): Balance;Endurance;Motor;Pain;Sensory PT Transfers Functional Problem(s): Bed Mobility;Bed to Chair;Car PT Locomotion Functional Problem(s): Ambulation;Wheelchair Mobility;Stairs PT Plan PT Intensity: Minimum of 1-2 x/day ,45 to 90 minutes PT Frequency: 5 out of 7 days PT Duration Estimated Length of Stay: 10-12 days PT Treatment/Interventions: Ambulation/gait training;Discharge planning;Functional mobility training;Therapeutic Activities;Balance/vestibular training;Disease management/prevention;Neuromuscular re-education;Skin care/wound management;Therapeutic Exercise;Wheelchair propulsion/positioning;DME/adaptive equipment instruction;Pain management;Splinting/orthotics;UE/LE Strength taining/ROM;Community reintegration;Functional electrical stimulation;Patient/family education;Stair training;UE/LE Coordination activities PT Transfers Anticipated Outcome(s): supervision with LRAD PT Locomotion Anticipated Outcome(s): supervision with LRAD PT Recommendation Follow Up Recommendations: Home health PT Patient destination: Home Equipment Recommended: To be determined Equipment Details: Has RW and  manual WC at home  Skilled Therapeutic Intervention Evaluation completed (see details above and below) with education on PT POC and goals and individual treatment initiated with focus on functional mobility/transfers, ambulation, simulated car transfer, stair navigation, LE strength, balance, and improved endurance with activity. Received pt sitting in WC, pt educated pn PT evaluation and therapy schedule and agreeable, pt denied any pain in LLE but stated she gets occasional sciatica pain and neck discomfort with mobility. Pt performed WC mobility 76f with bilateral UE and supervision with verbal cues for technique. Pt performed car transfer from SUV height with mod A without AD. Pt required verbal cues for pivoting on RLE to maintain LLE TDWB precautions. Pt ambulated 888fwith RW mod A +2 for WC follow. Pt with difficulty maintaining LLE TDWB precautions due to decreased bilateral UE strength. Pt required multiple rest breaks and increased time to compete activities throughout session. Pt performed stand<>pivot transfer from WC<>bed without AD mod A with verbal cues for LLE TDWB precautions and for pivoting on RLE. Pt transferred sit<>supine mod A and rolled to L and R with mod A. Concluded session with pt supine in bed, needs within reach, and bed alarm on.   PT Evaluation Precautions/Restrictions Precautions Precautions: Fall Precaution Comments: LLE TDWB Restrictions Weight Bearing Restrictions: Yes LLE Weight Bearing: Touchdown weight bearing Other Position/Activity Restrictions: TDWB through heel per ortho note Home Living/Prior Functioning Home Living Available Help at Discharge: Family(husban just  had hip surgery,m but sister can come down and help) Type of Home: House Home Access: Stairs to enter [employer] of Steps: 2 half steps to get into back of house Entrance Stairs-Rails: None Home Layout: Laundry or work area in basement;Able to live on main level with  bedroom/bathroom Alternate Level Stairs-Number of Steps: electric chair lift to go down to basement Bathroom Shower/Tub: Research officer, trade union Accessibility: Yes Additional Comments: Husband currently recovering from [place]. Sister able to provide assistance as needed  Lives With: Spouse Prior Function Level of Independence: Independent with basic ADLs;Independent with homemaking with ambulation;Independent with gait;Independent with transfers(furniture walker)  Able to Take Stairs?: Yes Driving: No Vocation: Retired Comments: Independent with household ambulation, uses walking stick outside. Sponge bathes Cognition Overall Cognitive Status: Within Functional Limits for tasks assessed Arousal/Alertness: Awake/alert Orientation Level: Oriented X4 Awareness: Appears intact Problem Solving: Appears intact Safety/Judgment: Appears intact Sensation Sensation Light Touch: Impaired by gross assessment Proprioception: Impaired by gross assessment Additional Comments: peripheral neuropathy, decreased sensation bilaterally below ankles. Decreased proprioception RLE, unable to test on LLE. Coordination Gross Motor Movements are Fluid and Coordinated: No Fine Motor Movements are Fluid and Coordinated: No Coordination and Movement Description: grossly uncoordinated due to LLE TDWB status and LE weakness Finger Nose Finger Test: mild dysmetria L>R Heel Shin Test: slower bilaterally Motor  Motor Motor: Abnormal postural alignment and control Motor - Skilled Clinical Observations: grossly uncoordinated due to LLE TDWB status and LE weakness  Mobility Bed Mobility Bed Mobility: Rolling Right;Rolling Left;Sit to Supine Rolling Right: Moderate Assistance - Patient 50-74% Rolling Left: Moderate Assistance - Patient 50-74% Sit to Supine: Moderate Assistance - Patient 50-74% Transfers Transfers: Sit to Stand;Stand Pivot Transfers Sit to Stand: Moderate Assistance - Patient 50-74% Stand Pivot  Transfers: Moderate Assistance - Patient 50 - 74% Stand Pivot Transfer Details: Verbal cues for sequencing;Verbal cues for technique;Verbal cues for precautions/safety Stand Pivot Transfer Details (indicate cue type and reason): verbal cues for technique, pivoting on RLE, and maintaining LLE TDWB status Transfer (Assistive device): None Locomotion  Gait Ambulation: Yes Gait Assistance: 2 Helpers Gait Distance (Feet): 8 Feet Assistive device: Rolling walker Gait Assistance Details: Verbal cues for sequencing;Verbal cues for technique;Verbal cues for precautions/safety;Verbal cues for safe use of DME/AE Gait Assistance Details: verbal cues for LLE TDWB status, step sequence, and RW safety Gait Gait: Yes Gait Pattern: Impaired Gait Pattern: Decreased trunk rotation;Decreased stride length;Decreased step length - right;Poor foot clearance - right Gait velocity: Decreased Wheelchair Mobility Wheelchair Mobility: Yes Wheelchair Assistance: Chartered loss adjuster: Both upper extremities Wheelchair Parts Management: Needs assistance Distance: 60f  Trunk/Postural Assessment  Cervical Assessment Cervical Assessment: Within Functional Limits Thoracic Assessment Thoracic Assessment: Within Functional Limits Lumbar Assessment Lumbar Assessment: Exceptions to WFL(posterior pelvic tilt) Postural Control Postural Control: Deficits on evaluation  Balance Balance Balance Assessed: Yes Static Sitting Balance Static Sitting - Balance Support: Bilateral upper extremity supported Static Sitting - Level of Assistance: 5: Stand by assistance Dynamic Sitting Balance Dynamic Sitting - Balance Support: Bilateral upper extremity supported Dynamic Sitting - Level of Assistance: 5: Stand by assistance Static Standing Balance Static Standing - Balance Support: Bilateral upper extremity supported(RW) Static Standing - Level of Assistance: 4: Min assist Dynamic Standing  Balance Dynamic Standing - Balance Support: Bilateral upper extremity supported(RW) Dynamic Standing - Level of Assistance: 1: +2 Total assist Extremity Assessment  RLE Assessment RLE Assessment: Exceptions to WB[facility] LLCGeneral Strength Comments: grossly generalized to 4/5 (except PF 3+/5) LLE Assessment LLE Assessment: Exceptions to [facility]  General Strength Comments: Grossly generalized to  4-/5 (unable to assess DF and PF strength due to boot)  Refer to Care Plan for Long Term Goals  Recommendations for other services: None   Discharge Criteria: Patient will be discharged from PT if patient refuses treatment 3 consecutive times without medical reason, if treatment goals not met, if there is a change in medical status, if patient makes no progress towards goals or if patient is discharged from hospital.  The above assessment, treatment plan, treatment alternatives and goals were discussed and mutually agreed upon: by patient  Alfonse Alpers PT, DPT  04/08/2019, 7:31 AM

## 2019-04-08 NOTE — Progress Notes (Signed)
Patient is resting at interval throughout shift, oriented nd cooperative respiration unlabored on room air, medicated  1 for discomfort to back and left foot with relief verbalized, Repositioned with some discomfort voiced. Made as comfortable as possible, call bell within reach,,

## 2019-04-09 LAB — CBC
HCT: 26.3 % — ABNORMAL LOW (ref 36.0–46.0)
Hemoglobin: 8.1 g/dL — ABNORMAL LOW (ref 12.0–15.0)
MCH: 31.4 pg (ref 26.0–34.0)
MCHC: 30.8 g/dL (ref 30.0–36.0)
MCV: 101.9 fL — ABNORMAL HIGH (ref 80.0–100.0)
Platelets: 241 10*3/uL (ref 150–400)
RBC: 2.58 MIL/uL — ABNORMAL LOW (ref 3.87–5.11)
RDW: 18.6 % — ABNORMAL HIGH (ref 11.5–15.5)
WBC: 12.1 10*3/uL — ABNORMAL HIGH (ref 4.0–10.5)
nRBC: 0 % (ref 0.0–0.2)

## 2019-04-09 LAB — GLUCOSE, CAPILLARY
Glucose-Capillary: 109 mg/dL — ABNORMAL HIGH (ref 70–99)
Glucose-Capillary: 161 mg/dL — ABNORMAL HIGH (ref 70–99)
Glucose-Capillary: 87 mg/dL (ref 70–99)
Glucose-Capillary: 113 mg/dL — ABNORMAL HIGH (ref 70–99)

## 2019-04-09 LAB — RENAL FUNCTION PANEL
Albumin: 2.3 g/dL — ABNORMAL LOW (ref 3.5–5.0)
Anion gap: 9 (ref 5–15)
BUN: 35 mg/dL — ABNORMAL HIGH (ref 8–23)
CO2: 27 mmol/L (ref 22–32)
Calcium: 7.7 mg/dL — ABNORMAL LOW (ref 8.9–10.3)
Chloride: 97 mmol/L — ABNORMAL LOW (ref 98–111)
Creatinine, Ser: 4.31 mg/dL — ABNORMAL HIGH (ref 0.44–1.00)
GFR calc Af Amer: 11 mL/min — ABNORMAL LOW (ref >60)
GFR calc non Af Amer: 10 mL/min — ABNORMAL LOW (ref >60)
Glucose, Bld: 177 mg/dL — ABNORMAL HIGH (ref 70–99)
Phosphorus: 2.9 mg/dL (ref 2.5–4.6)
Potassium: 3.8 mmol/L (ref 3.5–5.1)
Sodium: 133 mmol/L — ABNORMAL LOW (ref 135–145)

## 2019-04-09 NOTE — Progress Notes (Signed)
  [facility] Progress Note   Subjective:   Had good BM yesterady, feels much better.  No new c/o  Objective Vitals:   04/08/19 2009 04/09/19 0500 04/09/19 0506 04/09/19 0754  BP: (!) 125/53  (!) 142/61 (!) 120/56  Pulse: 82  86 81  Resp: 17  16   Temp: 98.2 F (36.8 C)  99 F (37.2 C)   TempSrc:   Oral   SpO2: 97%  96%   Weight:  73.1 kg    Height:       Physical Exam General: Well developed, well nourished female. Alert and in NAD Heart: RRR, no murmurs, rubs or gallops Lungs: CTA b/l without wheezing, rhonchi or rales Abdomen: Soft, mildly tender, non-distended. No rebound tenderness, +BS Extremities: LLE wrapped. RLE no edema Dialysis Access: RUE AVF + bruit  Dialysis OP: [facility]  350/800  3K/2.25 bath  72kg  Hep 5000   RUE AVF  - vancomycin 750 mg q. HD  - Mircera 50 mcg every 2 wks  - Hectorol 4 mcg IV tiw   Assessment/Plan: 1. Left 5th metatarsal osteomyelitis/septic arthritis now s/p left femoral endarterectomy 12/21(hx prior fem-pop bypass graft) and then L 5th ray amp Dr Sharol Given on 12/30 . Antibiotics narrowed to IV cefepime. Moved to CIR for rehab.  2. ESRD - HD TTS. Had HD yest off sched. HD today on sched. 3. HTN/volume- BP controlled. No gross volume overload on exam. Close to dry wt but prob has lost wt (was down here to 69kg).  4. Anemia- Hgb 8.5. Aranesp 60 q Tues increased to 131mcg. Tsat 19%. Start Fe course when antibiotics complete/infection resolved.  5.Metabolic Bone Disease- Corr Ca/Phos ok. Continue Renvela binder/VDRA.  6. Nutrition - Continue protein supplement for low albumin   [name], MD 04/09/2019, 10:37 AM  Medications: . sodium chloride    . sodium chloride     . allopurinol  100 mg Oral BID  . aspirin EC  81 mg Oral Daily  . atorvastatin  10 mg Oral q1800  . Chlorhexidine Gluconate Cloth  6 each Topical Q0600  . cinacalcet  30 mg Oral Q supper  . docusate sodium  100 mg Oral BID  . doxercalciferol  4 mcg  Intravenous Q T,Th,Sa-HD  . feeding supplement (NEPRO CARB STEADY)  237 mL Oral BID BM  . feeding supplement (PRO-STAT SUGAR FREE 64)  30 mL Oral BID  . hydrALAZINE  25 mg Oral Q8H  . insulin aspart  0-15 Units Subcutaneous TID WC  . insulin detemir  10 Units Subcutaneous Daily  . isosorbide dinitrate  10 mg Oral TID  . multivitamin  1 tablet Oral QHS  . pantoprazole  40 mg Oral Daily  . polyethylene glycol  17 g Oral BID  . sevelamer carbonate  1,600 mg Oral TID WC  . traZODone  25-100 mg Oral QHS

## 2019-04-09 NOTE — Progress Notes (Signed)
Received call from dialysis, patient will not be able to have dialysis tonight. To resume dialysis in the morning. Patient was informed.

## 2019-04-09 NOTE — Progress Notes (Signed)
Corazon Campione, RN called and notified that the patient's HD tx has been moved to 04/10/19.

## 2019-04-09 NOTE — Progress Notes (Signed)
Patient constipated. 500 cc tap water enema given. Patient had a medium sized bowl movement. Patient encouraged to get out of bed and use the bedside commode but she refused. Later on, patient decided to use the [facility] and had a large BM.

## 2019-04-09 NOTE — Progress Notes (Addendum)
[facility] PHYSICAL MEDICINE & REHABILITATION PROGRESS NOTE  Subjective/Complaints:  3 BMs overnight/this AM- sciatica acting up and wondering how to treat.  Did suggest sitting on tennis ball because pain STARTS in buttock per pt. Sitting up in bed; intermittently rubbed buttock.   ROS: . Denies CP, SOB, N/V/D  Objective: Vital Signs: Blood pressure (!) 120/56, pulse 81, temperature 99 F (37.2 C), temperature source Oral, resp. rate 16, height 5' (1.524 m), weight 73.1 kg, SpO2 96 %. DG Abd 1 View  Result Date: 04/08/2019 CLINICAL DATA:  Constipation EXAM: ABDOMEN - 1 VIEW COMPARISON:  Chest x-ray 03/23/2019 FINDINGS: Nonobstructed gas pattern with large volume of stool throughout the colon. Extensive vascular calcification. No radiopaque calculi. Punctate calcifications in the pelvis likely reflecting phleboliths. IMPRESSION: Nonobstructed gas pattern with large volume of feces throughout the colon and rectum Electronically Signed   By: Donavan Foil M.D.   On: 04/08/2019 20:04   Recent Labs    04/07/19 0248 04/09/19 1135  WBC 11.6* 12.1*  HGB 8.5* 8.1*  HCT 27.1* 26.3*  PLT 224 241   Recent Labs    04/07/19 0248 04/09/19 1135  NA 135 133*  K 4.4 3.8  CL 98 97*  CO2 24 27  GLUCOSE 172* 177*  BUN 40* 35*  CREATININE 4.64* 4.31*  CALCIUM 7.5* 7.7*    Physical Exam: BP (!) 120/56 (BP Location: Left Arm)   Pulse 81   Temp 99 F (37.2 C) (Oral)   Resp 16   Ht 5' (1.524 m)   Wt 73.1 kg   SpO2 96%   BMI 31.47 kg/m  Constitutional: No distress . Vital signs reviewed. Obese. HENT: Normocephalic.  Atraumatic. Eyes: EOMI. No discharge. Cardiovascular: No JVD. Respiratory: Normal effort.  No stridor. GI: Non-distended. Skin: LLE compressive dressing C/D/I. Psych: Normal mood.  Normal behavior. Musc: LLE with edema and foot tenderness Neuro: Alert Motor: B/l UE, RLE: 5/5 proximal to distal LLE: HF, KE 5/5, ankle limited due to dressing Sensation diminished to  light touch B/l LE  Assessment/Plan: 1. Functional deficits secondary to debility which require 3+ hours per day of interdisciplinary therapy in a comprehensive inpatient rehab setting.  Physiatrist is providing close team supervision and 24 hour management of active medical problems listed below.  Physiatrist and rehab team continue to assess barriers to discharge/monitor patient progress toward functional and medical goals  Care Tool:  Bathing    Body parts bathed by patient: Right arm, Left arm, Chest, Abdomen, Front perineal area, Face   Body parts bathed by helper: Buttocks     Bathing assist Assist Level: Minimal Assistance - Patient > 75%     Upper Body Dressing/Undressing Upper body dressing Upper body dressing/undressing activity did not occur (including orthotics): N/A What is the patient wearing?: Pull over shirt    Upper body assist Assist Level: Set up assist    Lower Body Dressing/Undressing Lower body dressing    Lower body dressing activity did not occur: N/A What is the patient wearing?: Incontinence brief, Pants     Lower body assist Assist for lower body dressing: Moderate Assistance - Patient 50 - 74%     Toileting Toileting    Toileting assist Assist for toileting: Moderate Assistance - Patient 50 - 74%     Transfers Chair/bed transfer  Transfers assist  Chair/bed transfer activity did not occur: Safety/medical concerns  Chair/bed transfer assist level: Moderate Assistance - Patient 50 - 74%     Locomotion Ambulation   Ambulation  assist      Assist level: 2 helpers Assistive device: Walker-rolling Max distance: 62ft   Walk 10 feet activity   Assist  Walk 10 feet activity did not occur: Safety/medical concerns(LLE TDWB status, LE weakness, fatigue)        Walk 50 feet activity   Assist Walk 50 feet with 2 turns activity did not occur: Safety/medical concerns(LLE TDWB status, LE weakness, fatigue)         Walk 150  feet activity   Assist Walk 150 feet activity did not occur: Safety/medical concerns(LLE TDWB status, LE weakness, fatigue)         Walk 10 feet on uneven surface  activity   Assist Walk 10 feet on uneven surfaces activity did not occur: Safety/medical concerns(LLE TDWB status, LE weakness, fatigue)         Wheelchair     Assist Will patient use wheelchair at discharge?: Yes Type of Wheelchair: Manual    Wheelchair assist level: Supervision/Verbal cueing Max wheelchair distance: 52ft    Wheelchair 50 feet with 2 turns activity    Assist    Wheelchair 50 feet with 2 turns activity did not occur: Safety/medical concerns(fatigue, increased R shoulder pain)       Wheelchair 150 feet activity     Assist Wheelchair 150 feet activity did not occur: Safety/medical concerns(fatigue, increased R shoulder pain)          Medical Problem List and Plan: 1.  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  Touchdown weightbearing left lower extremity, just for balance  Begin CIR evaluations 2.  Antithrombotics: -DVT/anticoagulation: ?None needed after recent peripheral vascular surgery             -antiplatelet therapy: Aspirin 81 mg daily 3. Pain Management: Flexeril and hydrocodone as needed  Monitor with increased mobility 4. Mood: Trazodone nightly.  Provide emotional support             -antipsychotic agents: N/A 5. Neuropsych: This patient is capable of making decisions on her own behalf. 6. Skin/Wound Care: Routine skin checks for incisions from fempop bypass and L 5th ray amputation  Monitor for wound healing 7. Fluids/Electrolytes/Nutrition: Routine in and outs 8.  End-stage renal disease.  Continue hemodialysis as  directed- T/H/S 9.Hypertension.  Hydralazine 25 mg every 8 hours, Isordil 10 mg 3 times daily.    Monitor with increased mobility 10.  Hyperlipidemia.  Lipitor 11.  History of gout.  Continue Zyloprim twice daily.  Monitor for gout flareups. 12.  Diabetes mellitus.  Levemir 10 units daily.  Check blood sugars before meals and at bedtime  Monitor with increased mobility 13. Osteomyelitis- Completed course of IV ABX  14. Anemia of chronic disease- restart Iron per Nephro 15. Drug induced constipation  KUB ordered  Bowel meds increased on 1/1  3 BMs 1/2 16. Leukocytosis  WBC 12.1 up from 11.6- afebrile- will monitor esp in setting of previous osteomyelitis- order labs for Monday  17. Sciatic/piriformis syndrome  -suggested tennis ball- will get family to bring in for her.  LOS: 2 days A FACE TO FACE EVALUATION WAS PERFORMED  Galen Malkowski 04/09/2019, 12:21 PM

## 2019-04-10 ENCOUNTER — Inpatient Hospital Stay (HOSPITAL_COMMUNITY): Payer: Medicare Other

## 2019-04-10 LAB — CBC
HCT: 27.6 % — ABNORMAL LOW (ref 36.0–46.0)
Hemoglobin: 8.5 g/dL — ABNORMAL LOW (ref 12.0–15.0)
MCH: 31.7 pg (ref 26.0–34.0)
MCHC: 30.8 g/dL (ref 30.0–36.0)
MCV: 103 fL — ABNORMAL HIGH (ref 80.0–100.0)
Platelets: 266 10*3/uL (ref 150–400)
RBC: 2.68 MIL/uL — ABNORMAL LOW (ref 3.87–5.11)
RDW: 18.5 % — ABNORMAL HIGH (ref 11.5–15.5)
WBC: 9.7 10*3/uL (ref 4.0–10.5)
nRBC: 0.2 % (ref 0.0–0.2)

## 2019-04-10 LAB — RENAL FUNCTION PANEL
Albumin: 2.4 g/dL — ABNORMAL LOW (ref 3.5–5.0)
Anion gap: 11 (ref 5–15)
BUN: 56 mg/dL — ABNORMAL HIGH (ref 8–23)
CO2: 24 mmol/L (ref 22–32)
Calcium: 7.9 mg/dL — ABNORMAL LOW (ref 8.9–10.3)
Chloride: 95 mmol/L — ABNORMAL LOW (ref 98–111)
Creatinine, Ser: 6.05 mg/dL — ABNORMAL HIGH (ref 0.44–1.00)
GFR calc Af Amer: 7 mL/min — ABNORMAL LOW (ref >60)
GFR calc non Af Amer: 6 mL/min — ABNORMAL LOW (ref >60)
Glucose, Bld: 101 mg/dL — ABNORMAL HIGH (ref 70–99)
Phosphorus: 3.9 mg/dL (ref 2.5–4.6)
Potassium: 3.9 mmol/L (ref 3.5–5.1)
Sodium: 130 mmol/L — ABNORMAL LOW (ref 135–145)

## 2019-04-10 LAB — GLUCOSE, CAPILLARY
Glucose-Capillary: 134 mg/dL — ABNORMAL HIGH (ref 70–99)
Glucose-Capillary: 121 mg/dL — ABNORMAL HIGH (ref 70–99)
Glucose-Capillary: 92 mg/dL (ref 70–99)

## 2019-04-10 MED ORDER — HYDROCORTISONE (PERIANAL) 2.5 % EX CREA
TOPICAL_CREAM | Freq: Two times a day (BID) | CUTANEOUS | Status: DC
  Filled 2019-04-10: qty 28.35

## 2019-04-10 NOTE — Progress Notes (Signed)
  [name] Progress Note   Subjective:  Stable, no new c/o's seen in room  Objective Vitals:   04/09/19 1954 04/10/19 0333 04/10/19 0500 04/10/19 0748  BP: 122/66 121/61  (!) 124/59  Pulse: 88 86  90  Resp: 18 16    Temp: 98.5 F (36.9 C) 98.6 F (37 C)    TempSrc:  Oral    SpO2: 95% 94%    Weight:   72.3 kg   Height:       Physical Exam General: Well developed, well nourished female. Alert and in NAD Heart: RRR, no murmurs, rubs or gallops Lungs: CTA b/l without wheezing, rhonchi or rales Abdomen: Soft, mildly tender, non-distended. No rebound tenderness, +BS Extremities: LLE wrapped. RLE no edema Dialysis Access: RUE AVF + bruit  Dialysis OP: [facility]  350/800  3K/2.25 bath  72kg  Hep 5000   RUE AVF  - vancomycin 750 mg q. HD  - Mircera 50 mcg every 2 wks  - Hectorol 4 mcg IV tiw   Assessment/Plan: 1. Left 5th metatarsal osteomyelitis/ septic arthritis: SP L fem endarterectomy 12/21(hx prior L fem-pop), then L 5th ray amp Dr Sharol Given on 12/30 . Antibiotics narrowed to IV cefepime. Moved to CIR for rehab.  2. ESRD - [facility] TTS. HD bumped to today d/t pt volumes 3. HTN/volume- BP controlled. No gross volume overload on exam. Close to dry wt but prob has lost wt (was down here to 69kg).  4. Anemia- Hgb 8.5. Aranesp 60 q Tues increased to 19mcg. Tsat 19%. Start Fe course when antibiotics complete/infection resolved.  5.Metabolic Bone Disease- Corr Ca/Phos ok. Continue Renvela binder/VDRA.  6. Nutrition - Continue protein supplement for low albumin   [name], MD 04/09/2019, 10:37 AM  Medications: . sodium chloride    . sodium chloride     . allopurinol  100 mg Oral BID  . aspirin EC  81 mg Oral Daily  . atorvastatin  10 mg Oral q1800  . Chlorhexidine Gluconate Cloth  6 each Topical Q0600  . cinacalcet  30 mg Oral Q supper  . docusate sodium  100 mg Oral BID  . doxercalciferol  4 mcg Intravenous Q T,Th,Sa-HD  . feeding supplement (NEPRO CARB  STEADY)  237 mL Oral BID BM  . feeding supplement (PRO-STAT SUGAR FREE 64)  30 mL Oral BID  . hydrALAZINE  25 mg Oral Q8H  . insulin aspart  0-15 Units Subcutaneous TID WC  . insulin detemir  10 Units Subcutaneous Daily  . isosorbide dinitrate  10 mg Oral TID  . multivitamin  1 tablet Oral QHS  . pantoprazole  40 mg Oral Daily  . polyethylene glycol  17 g Oral BID  . sevelamer carbonate  1,600 mg Oral TID WC  . traZODone  25-100 mg Oral QHS

## 2019-04-10 NOTE — Progress Notes (Signed)
Occupational Therapy Session Note  Patient Details  Name: [name] MRN: [id number] Date of Birth: 01-15-1946  Today's Date: 04/10/2019 OT Individual Time: 1050-1200 OT Individual Time Calculation (min): 70 min    Short Term Goals: Week 1:  OT Short Term Goal 1 (Week 1): STG = LTGs due to ELOS  Skilled Therapeutic Interventions/Progress Updates:    1;1. Pt received in bed with husband present. Pt agreeable to OT with MAX encouragement. Pt completes stand pivot transfer with RW to w/c with MOD A for lifting. Pt requires VC for WB precautions and pivoting on RLE. Pt completes grooming seated at sink with set up for oral care and increased time to fix wig for BUE strengthening/endurance without rest breaks. Pt provided HEP handout with photos and words to completes theraband program for global strengthening of BUE required for BADls and transfers. Pt completes 3x5 w/c pushups with VC for placing LLE into dependent position to decrease WB in activity and 3x10 w/c sit ups from EOC to promote core activation. Exited session with pt seated in bed, exit alarm on and call light in reach  Therapy Documentation Precautions:  Precautions Precautions: Fall Precaution Comments: LLE TDWB Restrictions Weight Bearing Restrictions: Yes LLE Weight Bearing: Touchdown weight bearing Other Position/Activity Restrictions: TDWB through heel per ortho note General:   Vital Signs:  Pain:   ADL: ADL Grooming: Minimal assistance Where Assessed-Grooming: Sitting at sink, Standing at sink Upper Body Bathing: Setup Where Assessed-Upper Body Bathing: Sitting at sink Lower Body Bathing: Minimal assistance Where Assessed-Lower Body Bathing: Standing at sink, Sitting at sink Upper Body Dressing: Supervision/safety Where Assessed-Upper Body Dressing: Sitting at sink Lower Body Dressing: Moderate assistance Where Assessed-Lower Body Dressing: Sitting at sink, Standing at sink Toilet Transfer: Moderate  assistance Toilet Transfer Method: Stand pivot Toilet Transfer Equipment: Art gallery manager    Praxis   Exercises:   Other Treatments:     Therapy/Group: Individual Therapy  Tonny Branch 04/10/2019, 12:07 PM

## 2019-04-10 NOTE — Progress Notes (Signed)
Physical Therapy Session Note  Patient Details  Name: [name] MRN: [id number] Date of Birth: 1945-08-03  Today's Date: 04/10/2019 PT Individual Time: 1740-8144 and 1355-1425 PT Individual Time Calculation (min): 84 min and 30 min   Short Term Goals: Week 1:  PT Short Term Goal 1 (Week 1): Pt will perform bed mobility CGA PT Short Term Goal 2 (Week 1): Pt will perform stand<>pivot transfer with LRAD CGA PT Short Term Goal 3 (Week 1): Pt will ambulate 22ft with LRAD mod A  Skilled Therapeutic Interventions/Progress Updates:     Session 1: Patient in bed upon PT arrival. Patient alert and agreeable to PT session. Patient denied pain throughout session. Focused session on LE strengthening, dressing, and transfer training an maintaining TDWB precautions on L LE. MD rounded during session. Patient with questions about dressing changes and follow-up wound care.   Therapeutic Activity: Bed Mobility: Patient performed supine to sit with min A-CGA for trunk support in a flat bed without use of bed rails. Provided verbal cues for performing side-lying to sit and setting bottom elbow and pushing up with B UE to sit up. Transfers: Patient performed stand pivot bed>w/c>mat table>w/c>BSC with mod A progressing to min A using a RW. Provided verbal cues for having her R hand push up and reach back with standing/sitting to shift weight over her R LE and off-weight he L and to have her L foot placed in front of her without any pressure or off the floor with NWB to maintain TDWB precautions. Also provided cues for pivoting technique and use of UEs to take small steps/hops on the R while maintaining TDWB or NWB on L.   Wheelchair Mobility:  Provided patient with a 18"x18" w/c with a standard cushion and L elevating leg rest during session. Educated on benefits of proper w/c fit and elevating L LE. Patient receptive to education.   Therapeutic Exercise: Patient performed the following exercises with  verbal and tactile cues for proper technique. -L AAROM knee flexion/extension 2x5 -B ankle pumps 2x10; educated on performing for reduced LE edema and clot formation with reduced activity  -R small bridging with lateral hip shift for strengthening with functional bed mobilty  Patient in w/c in room at end of session with breaks locked, chair alarm set, and all needs within reach.   Session 2: Patient in w/c asleep upon PT arrival. Patient easily aroused to verbal stimulation, however reported increased faitgue and requested to get back to bed.  She was agreeable to PT session focused on transfers and bed level exercises for LE strengthening/ROM. Patient reported 5/10 L thigh pain during session, RN made aware. PT provided repositioning, rest breaks, and distraction as pain interventions throughout session.   Therapeutic Activity: Bed Mobility: Patient performed sit to supine with min A for L LE managment. Provided verbal cues for bringing B LEs up at the same time to reduce stretch to medial thigh incisions. Transfers: Patient performed a stand pivot transfer with min A using the RW. Patient was able to teach-back hand placement on RW from previous session. Provided verbal cues for scooting forward in the w/c before standing and leaning far forward to stand.  Therapeutic Exercise: Patient performed the following exercises with verbal and tactile cues for proper technique. -L quad sets x10 -L SLR 2x10 -L hip abduction/adduction 2x15 -L AAROM hip flexion/extension 2x5 (increased pain limiting ROM due to medial thigh incisions) Educated on diaphragmatic breathing to manage pain during exercises. Patient receptive to education.  Patient in bed at end of session with breaks locked, bed alarm set, and all needs within reach.    Therapy Documentation Precautions:  Precautions Precautions: Fall Precaution Comments: LLE TDWB Restrictions Weight Bearing Restrictions: Yes LLE Weight Bearing:  Touchdown weight bearing Other Position/Activity Restrictions: TDWB through heel per ortho note    Therapy/Group: Individual Therapy  Briget Shaheed L Anibal Quinby PT, DPT  04/10/2019, 3:54 PM

## 2019-04-10 NOTE — Progress Notes (Signed)
[place] PHYSICAL MEDICINE & REHABILITATION PROGRESS NOTE  Subjective/Complaints:  Pt reports having hemorrhoid itching and burning.   Asking who's supposed to change the dressing on his L foot.    ROS: . Denies CP, SOB, N/V/D  Objective: Vital Signs: Blood pressure (!) 124/59, pulse 90, temperature 98.6 F (37 C), temperature source Oral, resp. rate 16, height 5' (1.524 m), weight 72.3 kg, SpO2 94 %. DG Abd 1 View  Result Date: 04/08/2019 CLINICAL DATA:  Constipation EXAM: ABDOMEN - 1 VIEW COMPARISON:  Chest x-ray 03/23/2019 FINDINGS: Nonobstructed gas pattern with large volume of stool throughout the colon. Extensive vascular calcification. No radiopaque calculi. Punctate calcifications in the pelvis likely reflecting phleboliths. IMPRESSION: Nonobstructed gas pattern with large volume of feces throughout the colon and rectum Electronically Signed   By: Donavan Foil M.D.   On: 04/08/2019 20:04   Recent Labs    04/09/19 1135  WBC 12.1*  HGB 8.1*  HCT 26.3*  PLT 241   Recent Labs    04/09/19 1135  NA 133*  K 3.8  CL 97*  CO2 27  GLUCOSE 177*  BUN 35*  CREATININE 4.31*  CALCIUM 7.7*    Physical Exam: BP (!) 124/59 (BP Location: Left Arm)   Pulse 90   Temp 98.6 F (37 C) (Oral)   Resp 16   Ht 5' (1.524 m)   Wt 72.3 kg   SpO2 94%   BMI 31.13 kg/m  Constitutional: No distress . Vital signs reviewed. Obese. HENT: Normocephalic.  Atraumatic. Eyes: EOMI. No discharge. Cardiovascular: No JVD. Respiratory: Normal effort.  No stridor. GI: Non-distended. Skin: LLE compressive dressing C/D/I. Psych: Normal mood.  Normal behavior. Musc: LLE with edema and foot tenderness Neuro: Alert Motor: B/l UE, RLE: 5/5 proximal to distal LLE: HF, KE 5/5, ankle limited due to dressing Sensation diminished to light touch B/l LE  Assessment/Plan: 1. Functional deficits secondary to debility which require 3+ hours per day of interdisciplinary therapy in a comprehensive  inpatient rehab setting.  Physiatrist is providing close team supervision and 24 hour management of active medical problems listed below.  Physiatrist and rehab team continue to assess barriers to discharge/monitor patient progress toward functional and medical goals  Care Tool:  Bathing    Body parts bathed by patient: Right arm, Left arm, Chest, Abdomen, Front perineal area, Face   Body parts bathed by helper: Buttocks     Bathing assist Assist Level: Minimal Assistance - Patient > 75%     Upper Body Dressing/Undressing Upper body dressing Upper body dressing/undressing activity did not occur (including orthotics): N/A What is the patient wearing?: Pull over shirt    Upper body assist Assist Level: Set up assist    Lower Body Dressing/Undressing Lower body dressing    Lower body dressing activity did not occur: N/A What is the patient wearing?: Incontinence brief     Lower body assist Assist for lower body dressing: Moderate Assistance - Patient 50 - 74%     Toileting Toileting    Toileting assist Assist for toileting: Moderate Assistance - Patient 50 - 74%     Transfers Chair/bed transfer  Transfers assist  Chair/bed transfer activity did not occur: Safety/medical concerns  Chair/bed transfer assist level: Moderate Assistance - Patient 50 - 74%     Locomotion Ambulation   Ambulation assist      Assist level: 2 helpers Assistive device: Walker-rolling Max distance: 75ft   Walk 10 feet activity   Assist  Walk 10 feet  activity did not occur: Safety/medical concerns(LLE TDWB status, LE weakness, fatigue)        Walk 50 feet activity   Assist Walk 50 feet with 2 turns activity did not occur: Safety/medical concerns(LLE TDWB status, LE weakness, fatigue)         Walk 150 feet activity   Assist Walk 150 feet activity did not occur: Safety/medical concerns(LLE TDWB status, LE weakness, fatigue)         Walk 10 feet on uneven surface   activity   Assist Walk 10 feet on uneven surfaces activity did not occur: Safety/medical concerns(LLE TDWB status, LE weakness, fatigue)         Wheelchair     Assist Will patient use wheelchair at discharge?: Yes Type of Wheelchair: Manual    Wheelchair assist level: Supervision/Verbal cueing Max wheelchair distance: 74ft    Wheelchair 50 feet with 2 turns activity    Assist    Wheelchair 50 feet with 2 turns activity did not occur: Safety/medical concerns(fatigue, increased R shoulder pain)       Wheelchair 150 feet activity     Assist Wheelchair 150 feet activity did not occur: Safety/medical concerns(fatigue, increased R shoulder pain)          Medical Problem List and Plan: 1.  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  Touchdown weightbearing left lower extremity, just for balance  1/3- who's to change dressing on L foot  Begin CIR evaluations 2.  Antithrombotics: -DVT/anticoagulation: ?None needed after recent peripheral vascular surgery             -antiplatelet therapy: Aspirin 81 mg daily 3. Pain Management: Flexeril and hydrocodone as needed  Monitor with increased mobility 4. Mood: Trazodone nightly.  Provide emotional support             -antipsychotic agents: N/A 5. Neuropsych: This patient is capable of making decisions on her own behalf. 6. Skin/Wound Care: Routine skin checks for incisions from fempop bypass and L 5th ray amputation  Monitor for wound healing 7. Fluids/Electrolytes/Nutrition: Routine in and outs 8.  End-stage renal disease.  Continue hemodialysis as directed- T/H/S 9.Hypertension.  Hydralazine 25 mg every 8 hours, Isordil 10 mg 3 times daily.    Monitor with increased mobility 10.   Hyperlipidemia.  Lipitor 11.  History of gout.  Continue Zyloprim twice daily.  Monitor for gout flareups. 12.  Diabetes mellitus.  Levemir 10 units daily.  Check blood sugars before meals and at bedtime   CBG (last 3)  Recent Labs    04/09/19 1655 04/09/19 2123 04/10/19 0627  GLUCAP 87 113* 134*    1/3- well controlled  Monitor with increased mobility 13. Osteomyelitis- Completed course of IV ABX  14. Anemia of chronic disease- restart Iron per Nephro 15. Drug induced constipation  KUB ordered  Bowel meds increased on 1/1  3 BMs 1/2  Hemorrhoid cream for hemorrhoid Sx's  16. Leukocytosis  WBC 12.1 up from 11.6- afebrile- will monitor esp in setting of previous osteomyelitis- order labs for Monday  17. Sciatic/piriformis syndrome  -suggested tennis ball- will get family to bring in for her.  LOS: 3 days A FACE TO FACE EVALUATION WAS PERFORMED  Shuna Tabor 04/10/2019, 11:33 AM

## 2019-04-11 ENCOUNTER — Inpatient Hospital Stay (HOSPITAL_COMMUNITY): Payer: Medicare Other | Admitting: Occupational Therapy

## 2019-04-11 ENCOUNTER — Inpatient Hospital Stay (HOSPITAL_COMMUNITY): Payer: Medicare Other

## 2019-04-11 DIAGNOSIS — D649 Anemia, unspecified: Secondary | ICD-10-CM

## 2019-04-11 LAB — RENAL FUNCTION PANEL
Albumin: 2.2 g/dL — ABNORMAL LOW (ref 3.5–5.0)
Anion gap: 13 (ref 5–15)
BUN: 38 mg/dL — ABNORMAL HIGH (ref 8–23)
CO2: 26 mmol/L (ref 22–32)
Calcium: 7.7 mg/dL — ABNORMAL LOW (ref 8.9–10.3)
Chloride: 97 mmol/L — ABNORMAL LOW (ref 98–111)
Creatinine, Ser: 5.07 mg/dL — ABNORMAL HIGH (ref 0.44–1.00)
GFR calc Af Amer: 9 mL/min — ABNORMAL LOW (ref >60)
GFR calc non Af Amer: 8 mL/min — ABNORMAL LOW (ref >60)
Glucose, Bld: 128 mg/dL — ABNORMAL HIGH (ref 70–99)
Phosphorus: 3.2 mg/dL (ref 2.5–4.6)
Potassium: 3.9 mmol/L (ref 3.5–5.1)
Sodium: 136 mmol/L (ref 135–145)

## 2019-04-11 LAB — CBC WITH DIFFERENTIAL/PLATELET
Abs Immature Granulocytes: 0.04 10*3/uL (ref 0.00–0.07)
Basophils Absolute: 0 10*3/uL (ref 0.0–0.1)
Basophils Relative: 0 %
Eosinophils Absolute: 0.1 10*3/uL (ref 0.0–0.5)
Eosinophils Relative: 2 %
HCT: 26.2 % — ABNORMAL LOW (ref 36.0–46.0)
Hemoglobin: 8 g/dL — ABNORMAL LOW (ref 12.0–15.0)
Immature Granulocytes: 1 %
Lymphocytes Relative: 12 %
Lymphs Abs: 0.8 10*3/uL (ref 0.7–4.0)
MCH: 30.7 pg (ref 26.0–34.0)
MCHC: 30.5 g/dL (ref 30.0–36.0)
MCV: 100.4 fL — ABNORMAL HIGH (ref 80.0–100.0)
Monocytes Absolute: 0.7 10*3/uL (ref 0.1–1.0)
Monocytes Relative: 10 %
Neutro Abs: 5.6 10*3/uL (ref 1.7–7.7)
Neutrophils Relative %: 75 %
Platelets: 224 10*3/uL (ref 150–400)
RBC: 2.61 MIL/uL — ABNORMAL LOW (ref 3.87–5.11)
RDW: 18.4 % — ABNORMAL HIGH (ref 11.5–15.5)
WBC: 7.3 10*3/uL (ref 4.0–10.5)
nRBC: 0.3 % — ABNORMAL HIGH (ref 0.0–0.2)

## 2019-04-11 LAB — GLUCOSE, CAPILLARY
Glucose-Capillary: 225 mg/dL — ABNORMAL HIGH (ref 70–99)
Glucose-Capillary: 121 mg/dL — ABNORMAL HIGH (ref 70–99)
Glucose-Capillary: 135 mg/dL — ABNORMAL HIGH (ref 70–99)
Glucose-Capillary: 135 mg/dL — ABNORMAL HIGH (ref 70–99)

## 2019-04-11 MED ORDER — SENNOSIDES-DOCUSATE SODIUM 8.6-50 MG PO TABS
1.0000 | ORAL_TABLET | Freq: Every day | ORAL | Status: DC
  Administered 2019-04-11: 1 via ORAL
  Filled 2019-04-11 (×2): qty 1

## 2019-04-11 MED ORDER — POLYETHYLENE GLYCOL 3350 17 G PO PACK
17.0000 g | PACK | Freq: Every day | ORAL | Status: DC
  Administered 2019-04-12: 17 g via ORAL
  Filled 2019-04-11 (×2): qty 1

## 2019-04-11 MED ORDER — DARBEPOETIN ALFA 100 MCG/0.5ML IJ SOSY
100.0000 ug | PREFILLED_SYRINGE | INTRAMUSCULAR | Status: DC
  Administered 2019-04-12: 100 ug via INTRAVENOUS
  Filled 2019-04-11 (×2): qty 0.5

## 2019-04-11 NOTE — Care Management (Signed)
Patient Details  Name: [name] MRN: [id number] Date of Birth: 1945/12/20  Today's Date: 04/11/2019  Problem List:  Patient Active Problem List   Diagnosis Date Noted  . Acute on chronic anemia   . Drug induced constipation   . Benign essential HTN   . Debility 04/07/2019  . 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 S/P femoral-popliteal bypass surgery 04/07/2019  . Chronic bilateral low back pain without sciatica   . Septic arthritis of left foot ([place])   . Cellulitis of left foot   . Abscess of right foot   . Pyogenic inflammation of bone ([place]) 03/23/2019  . Osteomyelitis of foot, left, acute ([place]) 03/23/2019  . Hyperlipidemia associated with type 2 diabetes mellitus ([place]) 01/09/2019  . Diabetes mellitus due to underlying condition with chronic kidney disease on chronic dialysis, with long-term current use of insulin ([place]) 01/09/2019  . ESRD on hemodialysis ([place])   . Generalized weakness   . Mitral valve mass   . Coronary artery disease involving coronary bypass graft of native heart without angina pectoris   . Bradycardia   . Anemia 05/03/2017  . Chronic kidney disease (CKD), active medical management without dialysis, stage 5 ([place]) 05/08/2015  . Diabetes mellitus type 2, insulin dependent ([place]) 05/08/2015  . BMI 29.0-29.9,adult 02/14/2015  . Medication management 08/04/2014  . Type II diabetes mellitus with neurological manifestations, uncontrolled ([place]) 02/17/2014  . Gouty arthritis   . Vitamin D deficiency   . Malignant neoplasm of upper-inner quadrant of left breast in female, estrogen receptor positive ([place]) 02/14/2013  . PVD (peripheral vascular disease) ([place]) 09/05/2009  . Hyperlipidemia 06/20/2009  . Essential hypertension 06/20/2009  . Congestive heart failure ([place]) 06/20/2009  . EMPHYSEMA 06/20/2009  . Asthma 06/20/2009   Past Medical History:  Past Medical History:  Diagnosis Date  . Allergy   . Arthritis     "maybe in my lower back" (08/27/2012)  . 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 Chronic combined systolic and diastolic CHF (congestive heart failure) ([place])   . CKD (chronic kidney disease), stage IV ([place])   . Coronary artery disease    a. NSTEMI in 05/2009 s/p CABG (LIMA-LAD, SVG-diagonal, SVG-OM1/OM 2).   Marland Kitchen Dyspnea    with exertion  . Family history of anesthesia complication    Mom has a hard time to wake up  . Foot ulcer ([place])    "I've had them on both feet" (08/27/2012)  . Gouty arthritis    "right index finger" (08/27/2012)  . History of radiation therapy 06/09/13-07/06/13   left breast 50Gy  . Hyperlipidemia   . Hypertension   . Infection    right second toe  . Ischemic cardiomyopathy    a. 2011: EF 40-45%. b. EF 55-60% in 04/2017 but shortly after as inpatient was 45-50%.  Marland Kitchen LBBB (left bundle branch block)   . Mitral regurgitation   . Neuropathy    Hx; of B/L feet  . NSTEMI (non-ST elevated myocardial infarction) ([place]) 05/23/2009  . Osteomyelitis of foot ([place])   . PAD (peripheral artery disease) ([facility])    a. LE PAD (patient previously elected hold off L fem-pop), prev followed by Dr. Bridgett Larsson  . PONV (postoperative nausea and vomiting)   . Sinus headache    "occasionally" (08/27/2012)  . Type II diabetes mellitus ([place])   . Vitamin D deficiency    Past Surgical History:  Past  Surgical History:  Procedure Laterality Date  . ABDOMINAL AORTOGRAM W/LOWER EXTREMITY Bilateral 03/25/2019   Procedure: ABDOMINAL AORTOGRAM W/LOWER EXTREMITY;  Surgeon: Marty Heck, MD;  Location: [facility];  Service: Cardiovascular;  Laterality: Bilateral;  . AMPUTATION Right 08/27/2012   Procedure: AMPUTATION RAY;  Surgeon: Newt Minion, MD;  Location: [facility];  Service: Orthopedics;  Laterality: Right;  Right Foot 5th Ray Amputation  . AMPUTATION Left 07/08/2013   Procedure: AMPUTATION RAY;  Surgeon: Newt Minion, MD;  Location: [facility];   Service: Orthopedics;  Laterality: Left;  Left Foot 2nd Ray Amputation  . AMPUTATION Left 04/06/2019   Procedure: LEFT 5TH RAY AMPUTATION;  Surgeon: Newt Minion, MD;  Location: [facility];  Service: Orthopedics;  Laterality: Left;  . AMPUTATION RAY Right 08/27/2012   5th ray/notes 08/27/2012  . AMPUTATION TOE Right 03/06/2017   Procedure: AMPUTATION TOE, INTERPHANGEAL 2ND RIGHT;  Surgeon: [name], DPM;  Location: [facility];  Service: Podiatry;  Laterality: Right;  . AV FISTULA PLACEMENT Right 11/11/2017   Procedure: RIGHT RADIOCEPHALIC  ARTERIOVENOUS FISTULA;  Surgeon: [name], MD;  Location: [facility];  Service: Vascular;  Laterality: Right;  . BREAST LUMPECTOMY  04/20/2013   with biopsy      DR WAKEFIELD  . BREAST LUMPECTOMY WITH NEEDLE LOCALIZATION AND AXILLARY SENTINEL LYMPH NODE BX Left 04/20/2013   Procedure: LEFT BREAST WIRE GUIDED LUMPECTOMY AND AXILLARY SENTINEL LYMPH NODE BX;  Surgeon: Rolm Bookbinder, MD;  Location: [facility];  Service: General;  Laterality: Left;  . CARDIAC CATHETERIZATION  05/24/2009   Archie Endo 05/24/2009 (08/27/2012)  . CATARACT EXTRACTION W/ INTRAOCULAR LENS  IMPLANT, BILATERAL  2000's  . COLONOSCOPY W/ BIOPSIES AND POLYPECTOMY  2010  . CORONARY ARTERY BYPASS GRAFT  2011   "CABG X4" (08/27/2012)  . DENTAL SURGERY  04/30/11   "1 implant" (08/27/2012)  . DILATION AND CURETTAGE OF UTERUS    . ENDARTERECTOMY FEMORAL Left 03/28/2019   Procedure: ENDARTERECTOMY FEMORAL;  Surgeon: [name], MD;  Location: [facility];  Service: Vascular;  Laterality: Left;  . EYE SURGERY    . FEMORAL-POPLITEAL BYPASS GRAFT Left 03/28/2019   Procedure: BYPASS GRAFT FEMORAL-POPLITEAL ARTERY;  Surgeon: [name], MD;  Location: [facility];  Service: Vascular;  Laterality: Left;  . FINGER SURGERY Right    "index finger; turned out to be gout" (08/27/2012)  . IR FLUORO GUIDE CV LINE RIGHT  11/30/2017  . PATCH ANGIOPLASTY Left 03/28/2019   Procedure: Patch Angioplasty;  Surgeon: [name],  MD;  Location: [facility];  Service: Vascular;  Laterality: Left;  . TEE WITHOUT CARDIOVERSION N/A 12/01/2017   Procedure: TRANSESOPHAGEAL ECHOCARDIOGRAM (TEE);  Surgeon: Lelon Perla, MD;  [facility];  Service: Cardiovascular;  Laterality: N/A;   Social History:  reports that she has never smoked. She has never used smokeless tobacco. She reports previous alcohol use. She reports that she does not use drugs.  Family / Support Systems Marital Status: Married Patient Roles: Spouse Spouse/Significant Other: Surveyor, minerals Children: Son: Sheron Tallman Other Supports: Sister from [place] (former Press photographer) Anticipated Caregiver: [name] Ability/Limitations of Caregiver: Supervision as recently had THA and is on a walker Caregiver Availability: 24/7 Family Dynamics: Good family support from Son and  patient's Sister  Social History Preferred language: English Religion: [employer] Education: 1 year [employer] Read: Yes Write: Yes Employment Status: Retired   Abuse/Neglect Abuse/Neglect Assessment Can Be Completed: Yes Physical Abuse: Denies Verbal Abuse: Denies Sexual Abuse: Denies  Exploitation of patient/patient's resources: Denies Self-Neglect: Denies  Emotional Status Pt's affect, behavior and adjustment status: Normal mood and affect, keeps eyes closed a lot during conversations  Patient / Family Perceptions, Expectations & Goals Pt/Family understanding of illness & functional limitations: Patient appears to have a good grasp on her health status and functional limitations, reports some premorbid memory issues, family helps keep her on track. Premorbid pt/family roles/activities: Spouse, mother, sister. [place] prior to admission; did not get out much, just to HD and doctor appointments. Watched a lot of TV. Pt/family expectations/goals: Goal is to be as independent as possible  Emergency planning/management officer Premorbid Home Care/DME Agencies: (Has used [facility] in the  past) Transportation available at discharge: Psychiatrist Living Arrangements: Spouse/significant other Support Systems: Spouse/significant other Type of Residence: Private residence Insurance Resources: Medicare, [place] (specify county[place]) Does the patient have any problems obtaining your medications?: No Home Management: Spouse Patient/Family Preliminary Plans: Home with spouse who can provide supervision and sister from [place] coming in who can provide minimal assistance as needed [place] Barriers to Discharge: Decreased caregiver support Sw Barriers to Discharge Comments: Husband can drive (post THA 83/41/96) however can only provide supervision assistance due to own functional limitations. Sister from [place] to come in to provide minimal assistance as needed. Social Work Anticipated Follow Up Needs: [place] Additional Notes/Comments: Has used [facility] in the past however would prefer to not use them unless absolutely necessary. Would like for spouse and sister to provide as much as they can. Expected length of stay: 7-10 days  Clinical Impression Pleasant woman appears fatigued after morning B+D session. Reports good understanding of rehab plan of care and current functional limitations and plans to manage multiple premorbid medical issues along with new weight limitations post foot surgery. Reports understanding of renal/CMM diet and T2DM management.  Dorien Chihuahua B 04/11/2019, 10:46 AM

## 2019-04-11 NOTE — Progress Notes (Addendum)
Subjective:  Hd yest tolerated ( off schedule sec high pt volumes) no cos currently   Objective Vital signs in last 24 hours: Vitals:   04/10/19 1956 04/10/19 2216 04/11/19 0436 04/11/19 0548  BP: (!) 120/58 (!) 116/53 (!) 128/56   Pulse: 79 82 87   Resp: 16  17   Temp:   98.8 F (37.1 C)   TempSrc: Oral     SpO2: 99% 98% 92%   Weight:    62.2 kg  Height:       Weight change: 0 kg         Physical Exam General: . Alert/  NAD Heart: RRR, 1/6 sem ,no rub or gallops Lungs: CTA b/l without wheezing, rhonchi or rales Abdomen: Soft, non tender, non-distended, +BS Extremities: LLE wrapped. RLE no edema Dialysis Access: RUE AVF + bruit  Dialysis OP: [facility] TTS  350/800  3K/2.25 bath  72kg  Hep 5000   RUE AVF  - vancomycin 750 mg q. HD  - Mircera 50 mcg every 2 wks  - Hectorol 4 mcg IV tiw   Problem/Plan: 1. Left 5th metatarsal osteomyelitis/ septic arthritis: SP L fem endarterectomy 12/21(hx prior L fem-pop), then L 5th ray ampDr Sharol Given on 12/30 .now off Antibiotics//  Moved to CIR for rehab.  2. ESRD - HD TTS. HD bumped past sat   d/t pt volumes/ hd yest  , next hd tomor on sched. K 3.9  3. HTN/volume- BP controlled. No gross volume overload on exam. Close to dry wt but prob has lost wt (was down here to 69kg).  4. Anemia- Hgb8.5> 8.0 . Aranesp 60 q Tues increased to 167mcg.Tsat 19%. Start Fe course when antibiotics complete/infection resolved.  5.Metabolic Bone Disease- Corr Ca/Phos ok. Continue Renvela binder/VDRA.  6. Nutrition - Continue protein supplement for low albumin  Ernest Haber, PA-C [facility] Beeper [phone] 04/11/2019,8:55 AM  LOS: 4 days   Labs: Basic Metabolic Panel: Recent Labs  Lab 04/09/19 1135 04/10/19 1736 04/11/19 0627  NA 133* 130* 136  K 3.8 3.9 3.9  CL 97* 95* 97*  CO2 27 24 26   GLUCOSE 177* 101* 128*  BUN 35* 56* 38*  CREATININE 4.31* 6.05* 5.07*  CALCIUM 7.7* 7.9* 7.7*  PHOS 2.9 3.9 3.2   Liver Function  Tests: Recent Labs  Lab 04/07/19 0248 04/09/19 1135 04/10/19 1736 04/11/19 0627  AST 11*  --   --   --   ALT 8  --   --   --   ALKPHOS 86  --   --   --   BILITOT 0.3  --   --   --   PROT 5.2*  --   --   --   ALBUMIN 2.4* 2.3* 2.4* 2.2*   No results for input(s): LIPASE, AMYLASE in the last 168 hours. No results for input(s): AMMONIA in the last 168 hours. CBC: Recent Labs  Lab 04/05/19 0724 04/07/19 0248 04/09/19 1135 04/10/19 1736 04/11/19 0627  WBC 7.1 11.6* 12.1* 9.7 7.3  NEUTROABS  --   --   --   --  5.6  HGB 8.0* 8.5* 8.1* 8.5* 8.0*  HCT 25.5* 27.1* 26.3* 27.6* 26.2*  MCV 99.6 100.7* 101.9* 103.0* 100.4*  PLT 221 224 241 266 224   Cardiac Enzymes: No results for input(s): CKTOTAL, CKMB, CKMBINDEX, TROPONINI in the last 168 hours. CBG: Recent Labs  Lab 04/09/19 2123 04/10/19 0627 04/10/19 1201 04/10/19 1556 04/11/19 0824  GLUCAP 113* 134* 121* 92 225*  Studies/Results: No results found. Medications:  . allopurinol  100 mg Oral BID  . aspirin EC  81 mg Oral Daily  . atorvastatin  10 mg Oral q1800  . Chlorhexidine Gluconate Cloth  6 each Topical Q0600  . cinacalcet  30 mg Oral Q supper  . docusate sodium  100 mg Oral BID  . doxercalciferol  4 mcg Intravenous Q T,Th,Sa-HD  . feeding supplement (NEPRO CARB STEADY)  237 mL Oral BID BM  . feeding supplement (PRO-STAT SUGAR FREE 64)  30 mL Oral BID  . hydrALAZINE  25 mg Oral Q8H  . hydrocortisone   Rectal BID  . insulin aspart  0-15 Units Subcutaneous TID WC  . insulin detemir  10 Units Subcutaneous Daily  . isosorbide dinitrate  10 mg Oral TID  . multivitamin  1 tablet Oral QHS  . pantoprazole  40 mg Oral Daily  . polyethylene glycol  17 g Oral BID  . sevelamer carbonate  1,600 mg Oral TID WC  . traZODone  25-100 mg Oral QHS    Nephrology attending: Patient was seen and examined.  Chart reviewed.  I agree with assessment and plan as outlined above. ESRD on HD TTS, plan for next dialysis tomorrow.   He is undergoing inpatient rehab after toe surgery.  On Aranesp, Renvela and vitamin D. Lawson Radar, MD [facility].

## 2019-04-11 NOTE — Progress Notes (Signed)
Occupational Therapy Session Note  Patient Details  Name: [name] MRN: [id number] Date of Birth: 06/08/1945  Today's Date: 04/11/2019 OT Individual Time: 1950-9326 OT Individual Time Calculation (min): 70 min    Short Term Goals: Week 1:  OT Short Term Goal 1 (Week 1): STG = LTGs due to ELOS  Skilled Therapeutic Interventions/Progress Updates:    Treatment session with focus on functional mobility and dynamic standing balance.  Pt received having just returned to bed after toileting with nursing staff.  Pt agreeable to therapy session, despite stating that she was very fatigued.  Pt completed bed mobility with supervision.  Min assist sit > stand from elevated EOB with RW.  Mod cues for sequencing and hand placement to increase safety and maintain WB precautions.  Pt completed stand pivot transfer Min assist to w/c with increased time.  Engaged in grooming tasks in sitting at sink for energy conservation. Pt propelled w/c 100' with supervision for BUE strengthening.  Engaged in stand pivot transfers Mod assist for sit > stand from w/c and min-mod from elevated surface.  Pt completed sit > stand x1 with Min assist.  While standing with focus on upright posture, pt reports incontinent BM.  Returned to w/c as above.  Returned to room and stood at sink with Mod assist for sit > stand and CGA while standing.  Therapist completed hygiene post BM.  Pt reports need to have another BM, therefore BSC placed behind pt.  She completed small BM on toilet.  Returned to upright with Mod assist, therapist completed hygiene and assisted with pulling pants over hips.  Pt returned to bed due to fatigue.  Pt left semi-reclined with all needs in reach.  Therapy Documentation Precautions:  Precautions Precautions: Fall Precaution Comments: LLE TDWB Restrictions Weight Bearing Restrictions: Yes LLE Weight Bearing: Touchdown weight bearing Other Position/Activity Restrictions: TDWB through heel per ortho  note Pain:  Pt with no c/o pain   Therapy/Group: Individual Therapy  Simonne Come 04/11/2019, 12:14 PM

## 2019-04-11 NOTE — Progress Notes (Signed)
Occupational Therapy Session Note  Patient Details  Name: [name] MRN: [id number] Date of Birth: 1945/07/22  Today's Date: 04/11/2019 OT Individual Time: 1400-1453 OT Individual Time Calculation (min): 53 min    Short Term Goals: Week 1:  OT Short Term Goal 1 (Week 1): STG = LTGs due to ELOS  Skilled Therapeutic Interventions/Progress Updates:    Treatment session with focus on BUE strengthening and activity tolerance.  Pt received seated upright in w/c upon arrival.  Pt immediately reporting fatigue and stating "I hope I can stay awake for your whole session".  Engaged in [place] with 2 sets of 10 chest presses, bicep curls, and lateral rows with 2# dowel rod.  Pt reports discomfort in Rt shoulder, reporting history of shoulder issues and questions rotator cuff injury (stating she has notified previous MDs).  Utilized handouts to engage in theraband exercises with focus on BUE strengthening as needed for sit > stand and functional mobility.  Completed 2 sets of 10 straight arm pulls, diagonal pulls, and forearm pulls.  Pt required rest breaks between each set.  Pt reporting fatigue and requesting to return to bed.  Completed sit > stand min assist with RW and stand pivot transfer back to bed Min assist.  Pt able to complete bed mobility with supervision and assistance for positioning of pillows for comfort.    Discussed HH vs OP follow up with pt agreeable to outpatient if she can progress to that point and her husband or sister able to provide transportation.  Pt reports that her sister plans to stay as long as needed to provide assistance upon d/c.  Therapy Documentation Precautions:  Precautions Precautions: Fall Precaution Comments: LLE TDWB Restrictions Weight Bearing Restrictions: Yes LLE Weight Bearing: Touchdown weight bearing Other Position/Activity Restrictions: TDWB through heel per ortho note General:   Vital Signs: Therapy Vitals Temp: 97.9 F (36.6  C) Pulse Rate: 82 Resp: 14 BP: 139/60 Patient Position (if appropriate): Sitting Oxygen Therapy SpO2: 100 % O2 Device: Room Air Pain:  Pt with no c/o pain   Therapy/Group: Individual Therapy  Simonne Come 04/11/2019, 3:14 PM

## 2019-04-11 NOTE — Progress Notes (Signed)
Physical Therapy Session Note  Patient Details  Name: [name] MRN: [id number] Date of Birth: 03/23/1946  Today's Date: 04/11/2019 PT Individual Time: 1100-1205 PT Individual Time Calculation (min): 65 min   Short Term Goals: Week 1:  PT Short Term Goal 1 (Week 1): Pt will perform bed mobility CGA PT Short Term Goal 2 (Week 1): Pt will perform stand<>pivot transfer with LRAD CGA PT Short Term Goal 3 (Week 1): Pt will ambulate 48ft with LRAD mod A  Skilled Therapeutic Interventions/Progress Updates:   Received pt supine in bed, pt agreeable to therapy, and stated she felt sore in her bow back but denied pain. Session focused on functional mobility/transfers, LE strength, dynamic standing balance, and improved endurance with activity. Pt performed bed mobility with supervision and increased time. Pt transferred stand<>pivot bed<>WC mod A with RW and verbal cues to maintain LLE TDWB precautions. Pt performed WC mobility 99ft x 2 trials using bilateral UEs and supervision. Stand<>pivot WC<>mat mod A with RW. Pt performed seated tricep extension 2x8 with yoga blocks. Pt required multiple rest breaks throughout session. Pt reported feeling nauseous and fatigued and requested to lie down. In supine pt performed bilateral SLR x12 and supine bridges 1x8 with LLE extended to prevent WB. Pt declined performing any more exercises due to increased fatigue. Stand<>pivot mat<>WC mod A. Concluded session with pt sitting in WC, needs within reach, and chair pad alarm on. Pt's husband requested therapist call to update him in private. Therapist educated husband, [name] on pt's CLOF and estimated discharge date. Husband expressed desire of pt to attend outpatient PT upon discharge.   Therapy Documentation Precautions:  Precautions Precautions: Fall Precaution Comments: LLE TDWB Restrictions Weight Bearing Restrictions: Yes LLE Weight Bearing: Touchdown weight bearing Other Position/Activity Restrictions:  TDWB through heel per ortho note   Therapy/Group: Individual Therapy Alfonse Alpers PT, DPT   04/11/2019, 7:39 AM

## 2019-04-11 NOTE — Progress Notes (Signed)
[place] PHYSICAL MEDICINE & REHABILITATION PROGRESS NOTE  Subjective/Complaints: Patient seen sitting up in bed this morning.  She states she did not sleep well overnight because she was having several small bowel movements.  She has questions regarding dialysis schedule.  ROS: Denies CP, SOB, N/V/D  Objective: Vital Signs: Blood pressure (!) 128/56, pulse 87, temperature 98.8 F (37.1 C), resp. rate 17, height 5' (1.524 m), weight 62.2 kg, SpO2 92 %. No results found. Recent Labs    04/10/19 1736 04/11/19 0627  WBC 9.7 7.3  HGB 8.5* 8.0*  HCT 27.6* 26.2*  PLT 266 224   Recent Labs    04/10/19 1736 04/11/19 0627  NA 130* 136  K 3.9 3.9  CL 95* 97*  CO2 24 26  GLUCOSE 101* 128*  BUN 56* 38*  CREATININE 6.05* 5.07*  CALCIUM 7.9* 7.7*    Physical Exam: BP (!) 128/56 (BP Location: Left Arm)   Pulse 87   Temp 98.8 F (37.1 C)   Resp 17   Ht 5' (1.524 m)   Wt 62.2 kg   SpO2 92%   BMI 26.78 kg/m  Constitutional: No distress . Vital signs reviewed.  Obese. HENT: Normocephalic.  Atraumatic. Eyes: EOMI. No discharge. Cardiovascular: No JVD. Respiratory: Normal effort.  No stridor. GI: Non-distended. Skin: Left lower extremity compressive dressing C/D/I Psych: Normal mood.  Normal behavior. Musc: Left lower extremity with edema and foot tenderness. Neuro: Alert Motor: B/l UE, RLE: 5/5 proximal to distal LLE: HF, KE 5/5, ankle limited due to dressing, unchanged  Assessment/Plan: 1. Functional deficits secondary to debility which require 3+ hours per day of interdisciplinary therapy in a comprehensive inpatient rehab setting.  Physiatrist is providing close team supervision and 24 hour management of active medical problems listed below.  Physiatrist and rehab team continue to assess barriers to discharge/monitor patient progress toward functional and medical goals  Care Tool:  Bathing    Body parts bathed by patient: Right arm, Left arm, Chest, Abdomen,  Front perineal area, Face   Body parts bathed by helper: Buttocks     Bathing assist Assist Level: Minimal Assistance - Patient > 75%     Upper Body Dressing/Undressing Upper body dressing Upper body dressing/undressing activity did not occur (including orthotics): N/A What is the patient wearing?: Pull over shirt    Upper body assist Assist Level: Set up assist    Lower Body Dressing/Undressing Lower body dressing    Lower body dressing activity did not occur: N/A What is the patient wearing?: Incontinence brief     Lower body assist Assist for lower body dressing: Moderate Assistance - Patient 50 - 74%     Toileting Toileting    Toileting assist Assist for toileting: Moderate Assistance - Patient 50 - 74%     Transfers Chair/bed transfer  Transfers assist  Chair/bed transfer activity did not occur: Safety/medical concerns  Chair/bed transfer assist level: Moderate Assistance - Patient 50 - 74% Chair/bed transfer assistive device: Museum/gallery exhibitions officer assist      Assist level: 2 helpers Assistive device: Walker-rolling Max distance: 5ft   Walk 10 feet activity   Assist  Walk 10 feet activity did not occur: Safety/medical concerns(LLE TDWB status, LE weakness, fatigue)        Walk 50 feet activity   Assist Walk 50 feet with 2 turns activity did not occur: Safety/medical concerns(LLE TDWB status, LE weakness, fatigue)         Walk 150 feet  activity   Assist Walk 150 feet activity did not occur: Safety/medical concerns(LLE TDWB status, LE weakness, fatigue)         Walk 10 feet on uneven surface  activity   Assist Walk 10 feet on uneven surfaces activity did not occur: Safety/medical concerns(LLE TDWB status, LE weakness, fatigue)         Wheelchair     Assist Will patient use wheelchair at discharge?: Yes Type of Wheelchair: Manual    Wheelchair assist level: Supervision/Verbal cueing Max  wheelchair distance: 40ft    Wheelchair 50 feet with 2 turns activity    Assist    Wheelchair 50 feet with 2 turns activity did not occur: Safety/medical concerns(fatigue, increased R shoulder pain)       Wheelchair 150 feet activity     Assist Wheelchair 150 feet activity did not occur: Safety/medical concerns(fatigue, increased R shoulder pain)          Medical Problem List and Plan: 1.  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  Touchdown weightbearing left lower extremity, just for balance  Continue CIR 2.  Antithrombotics: -DVT/anticoagulation: ?None needed after recent peripheral vascular surgery             -antiplatelet therapy: Aspirin 81 mg daily 3. Pain Management: Flexeril and hydrocodone as needed  Controlled on 1/4  Monitor with increased mobility 4. Mood: Trazodone nightly.  Provide emotional support             -antipsychotic agents: N/A 5. Neuropsych: This patient is capable of making decisions on her own behalf. 6. Skin/Wound Care: Routine skin checks for incisions from fempop bypass and L 5th ray amputation  Per surgery notes-dressing to remain in place, however will follow up with surgery  Monitor for wound healing 7. Fluids/Electrolytes/Nutrition: Routine in and outs 8.  End-stage renal disease.  Continue hemodialysis as directed- T/H/S 9.Hypertension.  Hydralazine 25 mg every 8 hours, Isordil 10 mg 3 times daily.    Controlled on 1/4  Monitor with increased mobility 10.  Hyperlipidemia.  Lipitor 11.  History of gout.  Continue Zyloprim twice daily.  Monitor for gout flareups. 12.  Diabetes mellitus type II with hyperglycemia.  Levemir 10 units daily.  Check blood sugars before meals and at bedtime   CBG (last  3)  Recent Labs    04/10/19 1201 04/10/19 1556 04/11/19 0824  GLUCAP 121* 92 225*   Elevated this a.m., otherwise relatively controlled, continue to monitor for trend  Monitor with increased mobility 13. Osteomyelitis- Completed course of IV ABX  14.  Acute on chronic anemia- restart Iron per Nephro  Hemoglobin 8.0 on 1/4  Continue to monitor 15. Drug induced constipation  KUB personally reviewed, suggesting constipation  Bowel meds increased on 1/1, adjusted further 1/4  Hemorrhoid cream for hemorrhoid Sx's  16. Leukocytosis: Resolved  WBC 7.3 on 1/4 17. Sciatic/piriformis syndrome  -suggested tennis ball- will get family to bring in for her.  LOS: 4 days A FACE TO FACE EVALUATION WAS PERFORMED  Kelten Enochs Lorie Phenix 04/11/2019, 8:51 AM

## 2019-04-11 NOTE — Progress Notes (Signed)
Inpatient Rehabilitation  Patient information reviewed and entered into eRehab system by Lamoine Fredricksen M. Xylan Sheils, M.A., CCC/SLP, PPS Coordinator.  Information including medical coding, functional ability and quality indicators will be reviewed and updated through discharge.    

## 2019-04-11 NOTE — Care Management (Signed)
Inpatient [facility] Individual Statement of Services  Patient Name:  [name]  Date:  04/11/2019  Welcome to the [facility].  Our goal is to provide you with an individualized program based on your diagnosis and situation, designed to meet your specific needs.  With this comprehensive rehabilitation program, you will be expected to participate in at least 3 hours of rehabilitation therapies Monday-Friday, with modified therapy programming on the weekends.  Your rehabilitation program will include the following services:  Physical Therapy (PT), Occupational Therapy (OT), 24 hour per day rehabilitation nursing, Case Management (Social Worker), Rehabilitation Medicine, Nutrition Services and Pharmacy Services  Weekly team conferences will be held on Wednesdays to discuss your progress.  Your Social Worker will talk with you frequently to get your input and to update you on team discussions.  Team conferences with you and your family in attendance may also be held.  Expected length of stay: 7-10 days  Overall anticipated outcome: Modified Independent with assistive device  Depending on your progress and recovery, your program may change. Your Social Worker will coordinate services and will keep you informed of any changes. Your Social Worker's name and contact numbers are listed  below.  The following services may also be recommended but are not provided by the [facility] will be made to provide these services after discharge if needed.  Arrangements include referral to agencies that provide these services.  Your insurance has been verified to be: Medicare A + B/ BCBS secondary  Your primary doctor is:  Liane Comber, NP with [facility]  Pertinent information will be shared with your doctor and your insurance company.  Social  Worker:  [place], [place] or [phone]   Information discussed with and copy given to patient by: Margarito Liner, 04/11/2019, 9:24 AM

## 2019-04-12 ENCOUNTER — Inpatient Hospital Stay (HOSPITAL_COMMUNITY): Payer: Medicare Other | Admitting: Occupational Therapy

## 2019-04-12 ENCOUNTER — Inpatient Hospital Stay (HOSPITAL_COMMUNITY): Payer: Medicare Other

## 2019-04-12 LAB — GLUCOSE, CAPILLARY
Glucose-Capillary: 127 mg/dL — ABNORMAL HIGH (ref 70–99)
Glucose-Capillary: 133 mg/dL — ABNORMAL HIGH (ref 70–99)
Glucose-Capillary: 84 mg/dL (ref 70–99)
Glucose-Capillary: 121 mg/dL — ABNORMAL HIGH (ref 70–99)

## 2019-04-12 MED ORDER — DARBEPOETIN ALFA 100 MCG/0.5ML IJ SOSY
PREFILLED_SYRINGE | INTRAMUSCULAR | Status: AC
  Filled 2019-04-12: qty 0.5

## 2019-04-12 MED ORDER — DOXERCALCIFEROL 4 MCG/2ML IV SOLN
INTRAVENOUS | Status: AC
  Filled 2019-04-12: qty 2

## 2019-04-12 NOTE — Progress Notes (Signed)
Physical Therapy Session Note  Patient Details  Name: [name] MRN: [id number] Date of Birth: 26-Sep-1945  Today's Date: 04/12/2019 PT Individual Time: 0900-1000 PT Individual Time Calculation (min): 60 min   Short Term Goals: Week 1:  PT Short Term Goal 1 (Week 1): Pt will perform bed mobility CGA PT Short Term Goal 2 (Week 1): Pt will perform stand<>pivot transfer with LRAD CGA PT Short Term Goal 3 (Week 1): Pt will ambulate 68ft with LRAD mod A  Skilled Therapeutic Interventions/Progress Updates:   Received pt supine in bed, pt agreeable to therapy and stated pain in low back, but pt received pain medication prior to session. Session focused on functional mobility/transfers while maintaining LLE TDWB precautions, dynamic standing balance, LE strength, and improved endurance with activity. Pt performed bed mobility with supervision. Pt transferred bed<>WC stand<>pivot without AD min A. Pt performed WC mobility 49ft x 1 and 5ft x 1 with bilateral UE's and supervision. Pt perfromed car transfer without AD mod A from SUV height with increased time. Pt required max verbal cues for technique. Pt ambulated 82ft x 2 trials min A with verbal cues to maintian LLE TDWB status. Pt performed standing horsehsoes toss x 3 trials with UE support on RW CGA. Pt fatigued at end of session. Concluded session with pt sitting in WC, needs within reach, and chair pad alarm on.   Therapy Documentation Precautions:  Precautions Precautions: Fall Precaution Comments: LLE TDWB Restrictions Weight Bearing Restrictions: Yes LLE Weight Bearing: Touchdown weight bearing Other Position/Activity Restrictions: TDWB through heel per ortho note   Therapy/Group: Individual Therapy Alfonse Alpers PT, DPT   04/12/2019, 7:34 AM

## 2019-04-12 NOTE — Progress Notes (Signed)
Surgical dressing to the left foot intact. Notified Dr. Posey Pronto about it and he reports that he has reached out to the surgeon to take care of it and that it is normal for surgical drsg to be in place for a week or more.

## 2019-04-12 NOTE — Progress Notes (Signed)
Occupational Therapy Session Note  Patient Details  Name: [name] MRN: [id number] Date of Birth: 02-Apr-1946  Today's Date: 04/12/2019 OT Individual Time: 8453-6468 and 1100-1200 OT Individual Time Calculation (min): 60 min and 60 min   Short Term Goals: Week 1:  OT Short Term Goal 1 (Week 1): STG = LTGs due to ELOS  Skilled Therapeutic Interventions/Progress Updates:    1) Treatment session with focus on functional mobility and dynamic standing balance during self-care tasks.  Pt agreeable to bathing this session.  Pt completed bed mobility with supervision.  Sit > stand with mod assist from EOB, progressing to min assist throughout session.  Completed stand pivot transfer CGA with RW.  Engaged in bathing and grooming tasks in sitting at sink with setup for items.  Pt deferred washing buttocks and changing pants this session.  Engaged in blocked practice of sit > stand with pt fluctuating from [place] assist to Supervision.  Pt with ability to complete one stand at supervision level, but unable to replicate.  Encouraged pt to remain seated in w/c for OOB tolerance.  2) Treatment session with focus on BUE strengthening as needed for sit > stand and functional mobility with RW to maintain TDWB through LLE.  Engaged in bicep curls, chest presses, and lateral rows 3 sets of 10 with 2# dowel rod.  Incorporated 1Kg ball with focus on trunk rotation and PNF pattern reaching, completing 2 sets of 10 each.  Tricep pushups from w/c 2 sets of 5 with focus on anterior weight shift.  Sit > stand x2 with CGA.  Pt incontinent of bowel during last stand.  Pt able to adjust pants with Supervision in standing, requiring assistance for hygiene d/t incontinence.  Pt remained upright in w/c with RN arriving to provide scheduled meds.  Therapy Documentation Precautions:  Precautions Precautions: Fall Precaution Comments: LLE TDWB Restrictions Weight Bearing Restrictions: Yes LLE Weight Bearing: Touchdown weight  bearing Other Position/Activity Restrictions: TDWB through heel per ortho note General:   Vital Signs:  Pain: Pain Assessment Pain Scale: 0-10 Pain Score: 2    Therapy/Group: Individual Therapy  Simonne Come 04/12/2019, 12:04 PM

## 2019-04-12 NOTE — Progress Notes (Signed)
[place] PHYSICAL MEDICINE & REHABILITATION PROGRESS NOTE  Subjective/Complaints: Patient seen sitting up in bed this morning.  She states she slept fairly well overnight, due to some back discomfort.  She has questions regarding dressing changes, discussed with nursing as well.  Continue to await callback from surgery.  Patient notes improvement in frequency of bowel movements.  ROS: Denies CP, SOB, N/V/D  Objective: Vital Signs: Blood pressure (!) 132/56, pulse 87, temperature 98.5 F (36.9 C), temperature source Oral, resp. rate 18, height 5' (1.524 m), weight 65.8 kg, SpO2 97 %. No results found. Recent Labs    04/10/19 1736 04/11/19 0627  WBC 9.7 7.3  HGB 8.5* 8.0*  HCT 27.6* 26.2*  PLT 266 224   Recent Labs    04/10/19 1736 04/11/19 0627  NA 130* 136  K 3.9 3.9  CL 95* 97*  CO2 24 26  GLUCOSE 101* 128*  BUN 56* 38*  CREATININE 6.05* 5.07*  CALCIUM 7.9* 7.7*    Physical Exam: BP (!) 132/56 (BP Location: Left Arm)   Pulse 87   Temp 98.5 F (36.9 C) (Oral)   Resp 18   Ht 5' (1.524 m)   Wt 65.8 kg   SpO2 97%   BMI 28.34 kg/m  Constitutional: No distress . Vital signs reviewed.  Obese. HENT: Normocephalic.  Atraumatic. Eyes: EOMI. No discharge. Cardiovascular: No JVD. Respiratory: Normal effort.  No stridor. GI: Non-distended. Skin: Left lower extremity compressive dressing C/D/I Psych: Normal mood.  Normal behavior. Musc: Left lower extremity edema Neuro: Alert Motor: B/l UE, RLE: 5/5 proximal to distal LLE: HF, KE 5/5, ankle limited due to dressing, stable  Assessment/Plan: 1. Functional deficits secondary to debility which require 3+ hours per day of interdisciplinary therapy in a comprehensive inpatient rehab setting.  Physiatrist is providing close team supervision and 24 hour management of active medical problems listed below.  Physiatrist and rehab team continue to assess barriers to discharge/monitor patient progress toward functional and  medical goals  Care Tool:  Bathing    Body parts bathed by patient: Right arm, Left arm, Chest, Abdomen, Front perineal area, Face   Body parts bathed by helper: Buttocks     Bathing assist Assist Level: Minimal Assistance - Patient > 75%     Upper Body Dressing/Undressing Upper body dressing Upper body dressing/undressing activity did not occur (including orthotics): N/A What is the patient wearing?: Pull over shirt    Upper body assist Assist Level: Set up assist    Lower Body Dressing/Undressing Lower body dressing    Lower body dressing activity did not occur: N/A What is the patient wearing?: Incontinence brief     Lower body assist Assist for lower body dressing: Moderate Assistance - Patient 50 - 74%     Toileting Toileting    Toileting assist Assist for toileting: Moderate Assistance - Patient 50 - 74%     Transfers Chair/bed transfer  Transfers assist  Chair/bed transfer activity did not occur: Safety/medical concerns  Chair/bed transfer assist level: Moderate Assistance - Patient 50 - 74% Chair/bed transfer assistive device: Museum/gallery exhibitions officer assist      Assist level: 2 helpers Assistive device: Walker-rolling Max distance: 77ft   Walk 10 feet activity   Assist  Walk 10 feet activity did not occur: Safety/medical concerns(LLE TDWB status, LE weakness, fatigue)        Walk 50 feet activity   Assist Walk 50 feet with 2 turns activity did not occur: Safety/medical  concerns(LLE TDWB status, LE weakness, fatigue)         Walk 150 feet activity   Assist Walk 150 feet activity did not occur: Safety/medical concerns(LLE TDWB status, LE weakness, fatigue)         Walk 10 feet on uneven surface  activity   Assist Walk 10 feet on uneven surfaces activity did not occur: Safety/medical concerns(LLE TDWB status, LE weakness, fatigue)         Wheelchair     Assist Will patient use wheelchair at  discharge?: Yes Type of Wheelchair: Manual    Wheelchair assist level: Supervision/Verbal cueing Max wheelchair distance: 6ft    Wheelchair 50 feet with 2 turns activity    Assist    Wheelchair 50 feet with 2 turns activity did not occur: (fatigue, increased R shoulder pain)   Assist Level: Supervision/Verbal cueing   Wheelchair 150 feet activity     Assist Wheelchair 150 feet activity did not occur: (fatigue, increased R shoulder pain)   Assist Level: Maximal Assistance - Patient 25 - 49%      Medical Problem List and Plan: 1.  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  Touchdown weightbearing left lower extremity, just for balance  Continue CIR 2.  Antithrombotics: -DVT/anticoagulation: ?None needed after recent peripheral vascular surgery             -antiplatelet therapy: Aspirin 81 mg daily 3. Pain Management: Flexeril and hydrocodone as needed  Controlled on 1/5 with medications  Monitor with increased mobility 4. Mood: Trazodone nightly.  Provide emotional support             -antipsychotic agents: N/A 5. Neuropsych: This patient is capable of making decisions on her own behalf. 6. Skin/Wound Care: Routine skin checks for incisions from fempop bypass and L 5th ray amputation  Per surgery notes-dressing to remain in place, however will follow up with surgery, no call back yesterday, will try again today  Monitor for wound healing 7. Fluids/Electrolytes/Nutrition: Routine in and outs 8.    ESRD.  Continue hemodialysis as directed- T/H/S 9.Hypertension.  Hydralazine 25 mg every 8 hours, Isordil 10 mg 3 times daily.    Relatively controlled on 1/5  Monitor with increased mobility 10.  Hyperlipidemia.  Lipitor 11.  History of  gout.  Continue Zyloprim twice daily.  Monitor for gout flareups. 12.  Diabetes mellitus type II with hyperglycemia.  Levemir 10 units daily.  Check blood sugars before meals and at bedtime   CBG (last 3)  Recent Labs    04/11/19 1645 04/11/19 2124 04/12/19 0614  GLUCAP 135* 135* 127*   Labile on 1/5, monitor for trend  Monitor with increased mobility 13. Osteomyelitis- Completed course of IV ABX  14.  Acute on chronic anemia- restart Iron per Nephro  Hemoglobin 8.0 on 1/4  Continue to monitor 15. Drug induced constipation  KUB personally reviewed, suggesting constipation  Bowel meds increased on 1/1, adjusted further 1/4  Hemorrhoid cream for hemorrhoid Sx's   Improving 16. Leukocytosis: Resolved  WBC 7.3 on 1/4 17. Sciatic/piriformis syndrome  -tennis ball- will get family to bring in for her.  LOS: 5 days A FACE TO FACE EVALUATION WAS PERFORMED  Ankit Lorie Phenix 04/12/2019, 9:04 AM

## 2019-04-12 NOTE — Plan of Care (Signed)
  Problem: RH Balance Goal: LTG Patient will maintain dynamic standing with ADLs (OT) Description: LTG:  Patient will maintain dynamic standing balance with assist during activities of daily living (OT)  Flowsheets (Taken 04/12/2019 1612) LTG: Pt will maintain dynamic standing balance during ADLs with: (downgraded) Supervision/Verbal cueing Note: Downgraded as pt continues to demonstrate difficulty with standing and maintaining TDWB through LLE   Problem: Sit to Stand Goal: LTG:  Patient will perform sit to stand in prep for activites of daily living with assistance level (OT) Description: LTG:  Patient will perform sit to stand in prep for activites of daily living with assistance level (OT) Flowsheets (Taken 04/12/2019 1612) LTG: PT will perform sit to stand in prep for activites of daily living with assistance level: (downgraded) Supervision/Verbal cueing Note: Downgraded as pt continues to demonstrate difficulty with standing and maintaining TDWB through LLE   Problem: RH Dressing Goal: LTG Patient will perform lower body dressing w/assist (OT) Description: LTG: Patient will perform lower body dressing with assist, with/without cues in positioning using equipment (OT) Flowsheets (Taken 04/12/2019 1612) LTG: Pt will perform lower body dressing with assistance level of: (downgraded) -- Note: Downgraded as pt continues to demonstrate difficulty with standing and maintaining TDWB through LLE during dynamic task   Problem: RH Toileting Goal: LTG Patient will perform toileting task (3/3 steps) with assistance level (OT) Description: LTG: Patient will perform toileting task (3/3 steps) with assistance level (OT)  Flowsheets (Taken 04/12/2019 1612) LTG: Pt will perform toileting task (3/3 steps) with assistance level: (downgraded) Supervision/Verbal cueing Note: Downgraded as pt continues to demonstrate difficulty with standing and maintaining TDWB through LLE during dynamic task

## 2019-04-12 NOTE — Plan of Care (Signed)
  Problem: RH Stairs Goal: LTG Patient will ambulate up and down stairs w/assist (PT) Description: LTG: Patient will ambulate up and down # of stairs with assistance (PT) Outcome: Not Applicable Flowsheets (Taken 04/12/2019 1225) LTG: Pt will ambulate up/down stairs assist needed:: (D/C) -- Note: D/C

## 2019-04-13 ENCOUNTER — Inpatient Hospital Stay (HOSPITAL_COMMUNITY): Payer: Medicare Other

## 2019-04-13 ENCOUNTER — Inpatient Hospital Stay (HOSPITAL_COMMUNITY): Payer: Medicare Other | Admitting: Occupational Therapy

## 2019-04-13 DIAGNOSIS — R159 Full incontinence of feces: Secondary | ICD-10-CM

## 2019-04-13 DIAGNOSIS — G8929 Other chronic pain: Secondary | ICD-10-CM

## 2019-04-13 DIAGNOSIS — M545 Low back pain: Secondary | ICD-10-CM

## 2019-04-13 LAB — GLUCOSE, CAPILLARY
Glucose-Capillary: 88 mg/dL (ref 70–99)
Glucose-Capillary: 117 mg/dL — ABNORMAL HIGH (ref 70–99)
Glucose-Capillary: 102 mg/dL — ABNORMAL HIGH (ref 70–99)
Glucose-Capillary: 92 mg/dL (ref 70–99)

## 2019-04-13 MED ORDER — LIDOCAINE 5 % EX PTCH
1.0000 | MEDICATED_PATCH | CUTANEOUS | Status: DC
  Administered 2019-04-13 – 2019-04-20 (×8): 1 via TRANSDERMAL
  Filled 2019-04-13 (×9): qty 1

## 2019-04-13 MED ORDER — SODIUM CHLORIDE 0.9 % IV SOLN
125.0000 mg | INTRAVENOUS | Status: DC
  Administered 2019-04-14 – 2019-04-19 (×2): 125 mg via INTRAVENOUS
  Filled 2019-04-13 (×4): qty 10

## 2019-04-13 NOTE — Progress Notes (Addendum)
Physical Therapy Session Note  Patient Details  Name: [name] MRN: [id number] Date of Birth: Jun 05, 1945  Today's Date: 04/13/2019 PT Individual Time: 1300-1419 PT Individual Time Calculation (min): 79 min   Short Term Goals: Week 1:  PT Short Term Goal 1 (Week 1): Pt will perform bed mobility CGA PT Short Term Goal 2 (Week 1): Pt will perform stand<>pivot transfer with LRAD CGA PT Short Term Goal 3 (Week 1): Pt will ambulate 32ft with LRAD mod A  Skilled Therapeutic Interventions/Progress Updates:  Pt seated in wheelchair.   Pt denied pain. She had on bil surgical shoes.  PT switched L ELR for another with better adjustability.   Pt locked/unlocked wc brakes with 1 cue.   Seated Therapeutic exercise performed with LE to increase strength for functional mobility 10 x 1 L ankle pumps, L long arc quad knee extensions, L hip flexion, 15 x 1 R hip flexion with 3# wt, R ong arc quad knee extension, R ankle pumps in knee extension , bil hip adduction squeezes, bil hip internal rotation with knees and hips flexed..  In standing, 10 x 1 L hip abduction, L knee flexion while standing R foot on 1" high board.  Gait training in parallel bars : mod assist sit> stand.  In standing, PT placed towel roll under forefoot to decrease chance of wt bearing through toes, x 5' forward and backward with mod assist, and bringing L foot with towel forward, max cues for sequencing.  Reciprocal scooting in sitting with/without use of UEs for core activation.   Transfer training for forward wt shift, bil hands on Kaye bench, LLE extended to reduce wt bearing, for 5 x 1 hip lifts off of the w/c.   At end of session, pt resting in bed with needs at hand and alarm set.     Therapy Documentation Precautions:  Precautions Precautions: Fall Precaution Comments: LLE TDWB Restrictions Weight Bearing Restrictions: Yes LLE Weight Bearing: Touchdown weight bearing Other Position/Activity Restrictions: TDWB through  heel per ortho note       Therapy/Group: Individual Therapy  Terence Bart 04/13/2019, 12:30 PM

## 2019-04-13 NOTE — Progress Notes (Signed)
Team Conference Report to Patient/Family  Team Conference discussion was reviewed with the patient and sister Fraser Din Thrift), including goals, any changes in plan of care and target discharge date.  Patient and sister expressed understanding and are in agreement.  The patient has a target discharge date of  04/20/19 at a supervision level overall. HH option selected by patient and spouse; [facility] and PT set up via [place]. Family education set up for Tuesday 04/19/19 @ 1000 with spouse and sister Fraser Din).  Dorien Chihuahua B 04/13/2019, 5:03 PM

## 2019-04-13 NOTE — Progress Notes (Signed)
Physical Therapy Session Note  Patient Details  Name: [name] MRN: [id number] Date of Birth: 05-21-1945  Today's Date: 04/13/2019 PT Individual Time: 0915-0957 PT Individual Time Calculation (min): 42 min   Short Term Goals: Week 1:  PT Short Term Goal 1 (Week 1): Pt will perform bed mobility CGA PT Short Term Goal 2 (Week 1): Pt will perform stand<>pivot transfer with LRAD CGA PT Short Term Goal 3 (Week 1): Pt will ambulate 28ft with LRAD mod A  Skilled Therapeutic Interventions/Progress Updates:   Received pt sitting in WC, pt agreeable to therapy, and reported pain in low back but did not state pain level. Session focused on functional mobility/transfers, dynamic standing balance, LE strength, and improved tolerance to activity. Extensive discussion had regarding discharge planning and therapy recommendations outpatient (per husband request) vs home health. Pt educated on current activity tolerance and concern for outpatient therapy being too intensive. Pt stated she would like to think more about it and make a decision sometime next week. Pt states her current plan is to have her sister come and help out since her husband is unable. Discussed limiting ambulation at home due to difficulty maintaining LLE TDWB precautions; pt agreed. Pt performed WC mobility 64ft with bilateral UE and supervision. Pt transferred stand<>pivot with RW CGA x 2 trials throughout session while maintaining LLW TDWB precautions. Pt performed dynamic standing balance playing cornhole x 2 trials CGA with UE support on RW. Pt required multiple rest breaks throughout session and tends to get easily distracted talking. Pt performed standing L hip flexion and knee flexion 2x10 with UE support on RW CGA. Pt required verbal cues for technique. Concluded session with pt sitting in WC, chair pad alarm on, and needs within reach.   Therapy Documentation Precautions:  Precautions Precautions: Fall Precaution Comments: LLE  TDWB Restrictions Weight Bearing Restrictions: Yes LLE Weight Bearing: Touchdown weight bearing Other Position/Activity Restrictions: TDWB through heel per ortho note   Therapy/Group: Individual Therapy Alfonse Alpers PT, DPT   04/13/2019, 7:37 AM

## 2019-04-13 NOTE — Plan of Care (Signed)
  Problem: Consults Goal: RH LIMB LOSS PATIENT EDUCATION Description: Description: See Patient Education module for eduction specifics. Outcome: Progressing Goal: Skin Care Protocol Initiated - if Braden Score 18 or less Description: If consults are not indicated, leave blank or document N/A Outcome: Progressing Goal: Nutrition Consult-if indicated Outcome: Progressing Goal: Diabetes Guidelines if Diabetic/Glucose > 140 Description: If diabetic or lab glucose is > 140 mg/dl - Initiate Diabetes/Hyperglycemia Guidelines & Document Interventions  Outcome: Progressing   Problem: RH BOWEL ELIMINATION Goal: RH STG MANAGE BOWEL WITH ASSISTANCE Description: STG Manage Bowel with min Assistance. Outcome: Progressing Goal: RH STG MANAGE BOWEL W/MEDICATION W/ASSISTANCE Description: STG Manage Bowel with Medication with min Assistance. Outcome: Progressing   Problem: RH SKIN INTEGRITY Goal: RH STG SKIN FREE OF INFECTION/BREAKDOWN Description: Patients skin will remain free from further infection or breakdown with min/mod assist. Outcome: Progressing Goal: RH STG MAINTAIN SKIN INTEGRITY WITH ASSISTANCE Description: STG Maintain Skin Integrity With min/mod Assistance. Outcome: Progressing Goal: RH STG ABLE TO PERFORM INCISION/WOUND CARE W/ASSISTANCE Description: STG Able To Perform Incision/Wound Care With total Assistance from caregiver. Outcome: Progressing   Problem: RH SAFETY Goal: RH STG ADHERE TO SAFETY PRECAUTIONS W/ASSISTANCE/DEVICE Description: STG Adhere to Safety Precautions With mod I Assistance/Device. Outcome: Progressing   Problem: RH PAIN MANAGEMENT Goal: RH STG PAIN MANAGED AT OR BELOW PT'S PAIN GOAL Description: < 4 Outcome: Progressing

## 2019-04-13 NOTE — Progress Notes (Signed)
Occupational Therapy Session Note  Patient Details  Name: [name] MRN: [id number] Date of Birth: 10-26-1945  Today's Date: 04/13/2019 OT Individual Time: 3953-2023 OT Individual Time Calculation (min): 60 min    Short Term Goals: Week 1:  OT Short Term Goal 1 (Week 1): STG = LTGs due to ELOS  Skilled Therapeutic Interventions/Progress Updates:    Treatment session with focus on functional transfers and dynamic standing balance during self-care tasks.  Pt received supine in bed agreeable to treatment session.  Pt completed bed mobility with supervision with increased time.  Completed sit > stand with supervision with RW and completed stand pivot transfer CGA to w/c.  Engaged in grooming tasks in sitting and bathing/dressing at sit > stand level.  Pt completed all sit > stand throughout session with CGA to close supervision.  Demonstrated increased stability to complete LB dressing including donning surgical shoes with supervision.  Encouraged by increased ability.  Discussed need to demonstrate consistency as pt's husband is unable to provide any physical assistance at this time.  Pt remained upright in w/c with all needs in reach.  Therapy Documentation Precautions:  Precautions Precautions: Fall Precaution Comments: LLE TDWB Restrictions Weight Bearing Restrictions: Yes LLE Weight Bearing: Touchdown weight bearing Other Position/Activity Restrictions: TDWB through heel per ortho note Pain: Pain Assessment Pain Scale: 0-10 Pain Score: 6  Pain Type: Chronic pain Pain Location: Back Pain Orientation: Lower Pain Radiating Towards: legs Pain Descriptors / Indicators: Spasm Pain Frequency: Intermittent Pain Onset: On-going Patients Stated Pain Goal: 3 Pain Intervention(s): Medication (See eMAR) Multiple Pain Sites: No ADL: ADL Grooming: Minimal assistance Where Assessed-Grooming: Sitting at sink, Standing at sink Upper Body Bathing: Setup Where Assessed-Upper Body  Bathing: Sitting at sink Lower Body Bathing: Minimal assistance Where Assessed-Lower Body Bathing: Standing at sink, Sitting at sink Upper Body Dressing: Supervision/safety Where Assessed-Upper Body Dressing: Sitting at sink Lower Body Dressing: Moderate assistance Where Assessed-Lower Body Dressing: Sitting at sink, Standing at sink Toilet Transfer: Moderate assistance Toilet Transfer Method: Stand pivot Toilet Transfer Equipment: Bedside commode   Therapy/Group: Individual Therapy  Simonne Come 04/13/2019, 8:15 AM

## 2019-04-13 NOTE — Progress Notes (Addendum)
[facility] Progress Note   Subjective: [name], up in chair. Says she had some cramping in legs on HD and is having issues with diarrhea when she has therapy. Oozing blood from AVF. Pressure held until hemostasis obtained.   Objective Vitals:   04/12/19 1548 04/12/19 1942 04/12/19 2221 04/13/19 0327  BP: (!) 141/64 (!) 105/49 (!) 125/57 133/61  Pulse: 84 86 90 84  Resp: 16 18  16   Temp: 98 F (36.7 C) 99.3 F (37.4 C)  98.7 F (37.1 C)  TempSrc:  Oral  Oral  SpO2:  94%  100%  Weight: 64.3 kg   68.9 kg  Height:       Physical Exam General: Pleasant older [name] in NAD Heart: K5,L9 1/6 systolic M. No R/G Lungs: CTAB Abdomen: S, NT, ND.Active BS.  Extremities: BLE wrapped but no edema visible in limited exam Dialysis Access: R AVF +T/B. Oozing as noted above now stabilized.   Additional Objective Labs: Basic Metabolic Panel: Recent Labs  Lab 04/09/19 1135 04/10/19 1736 04/11/19 0627  NA 133* 130* 136  K 3.8 3.9 3.9  CL 97* 95* 97*  CO2 27 24 26   GLUCOSE 177* 101* 128*  BUN 35* 56* 38*  CREATININE 4.31* 6.05* 5.07*  CALCIUM 7.7* 7.9* 7.7*  PHOS 2.9 3.9 3.2   Liver Function Tests: Recent Labs  Lab 04/07/19 0248 04/09/19 1135 04/10/19 1736 04/11/19 0627  AST 11*  --   --   --   ALT 8  --   --   --   ALKPHOS 86  --   --   --   BILITOT 0.3  --   --   --   PROT 5.2*  --   --   --   ALBUMIN 2.4* 2.3* 2.4* 2.2*   No results for input(s): LIPASE, AMYLASE in the last 168 hours. CBC: Recent Labs  Lab 04/07/19 0248 04/09/19 1135 04/10/19 1736 04/11/19 0627  WBC 11.6* 12.1* 9.7 7.3  NEUTROABS  --   --   --  5.6  HGB 8.5* 8.1* 8.5* 8.0*  HCT 27.1* 26.3* 27.6* 26.2*  MCV 100.7* 101.9* 103.0* 100.4*  PLT 224 241 266 224   Blood Culture    Component Value Date/Time   SDES BLOOD BLOOD LEFT FOREARM 03/23/2019 1538   SPECREQUEST  03/23/2019 1538    BOTTLES DRAWN AEROBIC AND ANAEROBIC Blood Culture results may not be optimal  due to an excessive volume of blood received in culture bottles   CULT  03/23/2019 1538    NO GROWTH 5 DAYS Performed at [facility] [address] [place], [place] [address]    REPTSTATUS 03/28/2019 FINAL 03/23/2019 1538    Cardiac Enzymes: No results for input(s): CKTOTAL, CKMB, CKMBINDEX, TROPONINI in the last 168 hours. CBG: Recent Labs  Lab 04/12/19 0614 04/12/19 1201 04/12/19 1632 04/12/19 2104 04/13/19 0619  GLUCAP 127* 133* 84 121* 88   Iron Studies: No results for input(s): IRON, TIBC, TRANSFERRIN, FERRITIN in the last 72 hours. @lablastinr3 @ Studies/Results: No results found. Medications:  . allopurinol  100 mg Oral BID  . aspirin EC  81 mg Oral Daily  . atorvastatin  10 mg Oral q1800  . Chlorhexidine Gluconate Cloth  6 each Topical Q0600  . cinacalcet  30 mg Oral Q supper  . darbepoetin (ARANESP) injection - DIALYSIS  100 mcg Intravenous Q Tue-HD  . doxercalciferol  4 mcg Intravenous Q T,Th,Sa-HD  . feeding supplement (NEPRO CARB STEADY)  237 mL Oral BID BM  . feeding supplement (PRO-STAT SUGAR FREE 64)  30 mL Oral BID  . hydrALAZINE  25 mg Oral Q8H  . hydrocortisone   Rectal BID  . insulin aspart  0-15 Units Subcutaneous TID WC  . insulin detemir  10 Units Subcutaneous Daily  . isosorbide dinitrate  10 mg Oral TID  . lidocaine  1 patch Transdermal Q24H  . multivitamin  1 tablet Oral QHS  . pantoprazole  40 mg Oral Daily  . sevelamer carbonate  1,600 mg Oral TID WC  . traZODone  25-100 mg Oral QHS     Dialysis OP: [facility] T[facility] 350/800 3K/2.25 bath 72kg   -Heparin 5000 Units IV TIW - vancomycin 750 mg q. HD (now Dc'd) - Mircera 50 mcg every 2 wks - Hectorol 4 mcg IV tiw   Assessment/Plan: 1. 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  Moved to CIR for rehab.  2. ESRD - HD TTS.HD tomorrow on schedule. K+ 3.9 Use 3.0 K  bath. No heparin. Oozing noted from R AVF. Pressure held 10 min, Sure Seal drsg applied. Nurse to monitor. Says this is first time this has happened. Most likely D/T full dose heparin in shortened treatment. Adjust heparin dose for shortened treatment.  3. HTN/volume- BP controlled. No gross volume overload on exam.  HD 01/05 Net UF 1.5 Post wt 64.3. Well under OP EDW. She will need to lower EDW on discharge.  4. Anemia- Last HGB 8.0. Rec'd Aranesp 100 mcg IV 01/05. Recheck iron panel tomorrow and replete if needed.  5.Metabolic Bone Disease- Corr Ca/Phos at goal. Continue Renvela binder/VDRA.  6. Nutrition - Continue protein supplement for low albumin  Pamela H. Brown NP-C 04/13/2019, 11:52 AM  [facility] [phone]   Patient was seen and examined.  Chart reviewed.  I agree with assessment and plan as outlined above. ESRD on HD TTS, s/p HD yesterday, next HD tmr.  He is undergoing inpatient rehab after toe surgery.  On Aranesp, Renvela and vitamin D. Start IV iron with HD.  Pamela Radar, MD [facility].

## 2019-04-13 NOTE — Progress Notes (Signed)
[facility] PHYSICAL MEDICINE & REHABILITATION PROGRESS NOTE  Subjective/Complaints: Patient seen sitting up in her chair working with therapies.  She states she slept well overnight.  She has questions regarding discharge therapies, discussed with therapies.  She has qurstions regarding IV access.  She also notes frequent stools, discussed with therapies as well. Discussed dressing take down with patient and nursing as well.   ROS: +Bowel incontinence. Denies CP, SOB, N/V/D  Objective: Vital Signs: Blood pressure 133/61, pulse 84, temperature 98.7 F (37.1 C), temperature source Oral, resp. rate 16, height 5' (1.524 m), weight 68.9 kg, SpO2 100 %. No results found. Recent Labs    04/10/19 1736 04/11/19 0627  WBC 9.7 7.3  HGB 8.5* 8.0*  HCT 27.6* 26.2*  PLT 266 224   Recent Labs    04/10/19 1736 04/11/19 0627  NA 130* 136  K 3.9 3.9  CL 95* 97*  CO2 24 26  GLUCOSE 101* 128*  BUN 56* 38*  CREATININE 6.05* 5.07*  CALCIUM 7.9* 7.7*    Physical Exam: BP 133/61 (BP Location: Left Arm)   Pulse 84   Temp 98.7 F (37.1 C) (Oral)   Resp 16   Ht 5' (1.524 m)   Wt 68.9 kg   SpO2 100%   BMI 29.69 kg/m  Constitutional: No distress . Vital signs reviewed. Obese.  HENT: Normocephalic.  Atraumatic. Eyes: EOMI. No discharge. Cardiovascular: No JVD. Respiratory: Normal effort.  No stridor. GI: Non-distended. Skin: Left lower extremity compressive dressing C/D/I. Psych: Normal mood.  Normal behavior. Musc: Left lower extremity edema Neuro: Alert Motor: B/l UE, RLE: 5/5 proximal to distal LLE: HF, KE 5/5, ankle limited due to dressing, unchanged  Assessment/Plan: 1. Functional deficits secondary to debility which require 3+ hours per day of interdisciplinary therapy in a comprehensive inpatient rehab setting.  Physiatrist is providing close team supervision and 24 hour management of active medical problems listed below.  Physiatrist and rehab team continue to assess  barriers to discharge/monitor patient progress toward functional and medical goals  Care Tool:  Bathing    Body parts bathed by patient: Right arm, Left arm, Chest, Abdomen, Front perineal area, Face   Body parts bathed by helper: Buttocks     Bathing assist Assist Level: Minimal Assistance - Patient > 75%     Upper Body Dressing/Undressing Upper body dressing Upper body dressing/undressing activity did not occur (including orthotics): N/A What is the patient wearing?: Pull over shirt    Upper body assist Assist Level: Set up assist    Lower Body Dressing/Undressing Lower body dressing    Lower body dressing activity did not occur: N/A What is the patient wearing?: Incontinence brief     Lower body assist Assist for lower body dressing: Moderate Assistance - Patient 50 - 74%     Toileting Toileting    Toileting assist Assist for toileting: Minimal Assistance - Patient > 75%     Transfers Chair/bed transfer  Transfers assist  Chair/bed transfer activity did not occur: Safety/medical concerns  Chair/bed transfer assist level: Contact Guard/Touching assist Chair/bed transfer assistive device: Programmer, multimedia   Ambulation assist      Assist level: Minimal Assistance - Patient > 75% Assistive device: Walker-rolling Max distance: 51ft   Walk 10 feet activity   Assist  Walk 10 feet activity did not occur: Safety/medical concerns(LLE TDWB status, LE weakness, fatigue)        Walk 50 feet activity   Assist Walk 50 feet with 2  turns activity did not occur: Safety/medical concerns(LLE TDWB status, LE weakness, fatigue)         Walk 150 feet activity   Assist Walk 150 feet activity did not occur: Safety/medical concerns(LLE TDWB status, LE weakness, fatigue)         Walk 10 feet on uneven surface  activity   Assist Walk 10 feet on uneven surfaces activity did not occur: Safety/medical concerns(LLE TDWB status, LE weakness,  fatigue)         Wheelchair     Assist Will patient use wheelchair at discharge?: Yes Type of Wheelchair: Manual    Wheelchair assist level: Supervision/Verbal cueing Max wheelchair distance: 82ft    Wheelchair 50 feet with 2 turns activity    Assist    Wheelchair 50 feet with 2 turns activity did not occur: (fatigue, increased R shoulder pain)   Assist Level: Supervision/Verbal cueing   Wheelchair 150 feet activity     Assist Wheelchair 150 feet activity did not occur: (fatigue, increased R shoulder pain)   Assist Level: Maximal Assistance - Patient 25 - 49%      Medical Problem List and Plan: 1.  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  Touchdown weightbearing left lower extremity, just for balance  Continue CIR  Team conference today to discuss current and goals and coordination of care, home and environmental barriers, and discharge planning with nursing, case manager, and therapies.   IV access d/ced 2.  Antithrombotics: -DVT/anticoagulation: ?None needed after recent peripheral vascular surgery             -antiplatelet therapy: Aspirin 81 mg daily 3. Pain Management: Flexeril and hydrocodone as needed  Controlled on 1/6 with medications  Monitor with increased mobility 4. Mood: Trazodone nightly.  Provide emotional support             -antipsychotic agents: N/A 5. Neuropsych: This patient is capable of making decisions on her own behalf. 6. Skin/Wound Care: Routine skin checks for incisions from fempop bypass and L 5th ray amputation  Per discussion with Ortho PA - take down surgical dressing and wrap with gauze, kerlix, and ace wrap  Monitor for wound healing 7. Fluids/Electrolytes/Nutrition: Routine in and  outs 8.    ESRD.  Continue hemodialysis as directed- T/H/S 9.Hypertension.  Hydralazine 25 mg every 8 hours, Isordil 10 mg 3 times daily.    Labile on 1/6  Monitor with increased mobility 10.  Hyperlipidemia.  Lipitor 11.  History of gout.  Continue Zyloprim twice daily.  Monitor for gout flareups. 12.  Diabetes mellitus type II with hyperglycemia.  Levemir 10 units daily.  Check blood sugars before meals and at bedtime   CBG (last 3)  Recent Labs    04/12/19 1632 04/12/19 2104 04/13/19 0619  GLUCAP 84 121* 88   Relatively controlled on 1/6  Monitor with increased mobility 13. Osteomyelitis- Completed course of IV ABX  14.  Acute on chronic anemia- restart Iron per Nephro  Hemoglobin 8.0 on 1/4  Continue to monitor 15. Drug induced constipation, now with bowel incontinence  KUB personally reviewed, suggesting constipation  Bowel meds increased on 1/1, adjusted further 1/4, d/ced on 1/6  Will consider Fiber  Hemorrhoid cream for hemorrhoid Sx's  16. Leukocytosis: Resolved  WBC 7.3 on 1/4 17. Sciatic/piriformis syndrome  -  tennis ball- will get family to bring in for her.  Lidocaine patch ordered  LOS: 6 days A FACE TO FACE EVALUATION WAS PERFORMED  Sravya Grissom Lorie Phenix 04/13/2019, 8:33 AM

## 2019-04-13 NOTE — Patient Care Conference (Signed)
Inpatient RehabilitationTeam Conference and Plan of Care Update Date: 04/13/2019   Time: 11:45 AM    Patient Name: [name]      Medical Record Number: [id number]  Date of Birth: 05-Dec-1945 Sex: Female         Room/Bed: 4W19C/4W19C-01 Payor Info: Payor: MEDICARE / Plan: MEDICARE PART A AND B / Product Type: *No Product type* /    Admit Date/Time:  04/07/2019  2:17 PM  Primary Diagnosis:  Debility  Patient Active Problem List   Diagnosis Date Noted  . Full incontinence of feces   . Acute on chronic anemia   . Drug induced constipation   . Benign essential HTN   . Debility 04/07/2019  . 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 S/P femoral-popliteal bypass surgery 04/07/2019  . Chronic bilateral low back pain without sciatica   . Septic arthritis of left foot ([place])   . Cellulitis of left foot   . Abscess of right foot   . Pyogenic inflammation of bone ([place]) 03/23/2019  . Osteomyelitis of foot, left, acute ([place]) 03/23/2019  . Hyperlipidemia associated with type 2 diabetes mellitus ([place]) 01/09/2019  . Diabetes mellitus due to underlying condition with chronic kidney disease on chronic dialysis, with long-term current use of insulin ([place]) 01/09/2019  . ESRD on hemodialysis ([place])   . Generalized weakness   . Mitral valve mass   . Coronary artery disease involving coronary bypass graft of native heart without angina pectoris   . Bradycardia   . Anemia 05/03/2017  . Chronic kidney disease (CKD), active medical management without dialysis, stage 5 ([place]) 05/08/2015  . Diabetes mellitus type 2, insulin dependent ([place]) 05/08/2015  . BMI 29.0-29.9,adult 02/14/2015  . Medication management 08/04/2014  . Type II diabetes mellitus with neurological manifestations, uncontrolled ([place]) 02/17/2014  . Gouty arthritis   . Vitamin D deficiency   . Malignant neoplasm of upper-inner quadrant of left breast in female, estrogen receptor positive ([place])  02/14/2013  . PVD (peripheral vascular disease) ([place]) 09/05/2009  . Hyperlipidemia 06/20/2009  . Essential hypertension 06/20/2009  . Congestive heart failure ([place]) 06/20/2009  . EMPHYSEMA 06/20/2009  . Asthma 06/20/2009    Expected Discharge Date: Expected Discharge Date: 04/20/19  Team Members Present: Physician leading conference: Dr. Delice Lesch Social Worker Present: Lennart Pall, LCSW Nurse Present: Serena Croissant, LPN Case Manager: Karene Fry, RN PT Present: Becky Sax, PT OT Present: Simonne Come, OT SLP Present: Jettie Booze, CF-SLP PPS Coordinator present : Ileana Ladd, Burna Mortimer, SLP     Current Status/Progress Goal Weekly Team Focus  Bowel/Bladder   Patient is continent of BM, oliguric and continent of bladder  able to continue being continent with B/B  q shift assessment of B/B   Swallow/Nutrition/ Hydration             ADL's   Min-mod assist sit > stand and stand pivot transfers, CGA dynamic standing balance  Supervision  ADL retraining, functional transfers, sit <> stand, dynamic standing balance, endurance, BUE strengthening   Mobility   bed mobility supervision, transfers mod A, gait 41ft mod A  supervision  maintaining LLE TDWB precuations, LE strength, balance, transfers, endurance   Communication             Safety/Cognition/ Behavioral Observations            Pain   patient c/o moderate back pain that radiate towards the legs  patient pain will decrease  q  shift and PRN pain assessment   Skin   patient have pressure ulcer in the buttock, close incision in the right hip  patient pressure ulcer and incision completely healed with no infection  q shift and PRN skin assessment    Rehab Goals Patient on target to meet rehab goals: Yes *See Care Plan and progress notes for long and short-term goals.     Barriers to Discharge  Current Status/Progress Possible Resolutions Date Resolved   Nursing                  PT  Medical  stability;Hemodialysis;Weight bearing restrictions  LLE TDWB status              OT                  SLP                SW Decreased caregiver support Husband can drive (post THA 62/69/48) however can only provide supervision assistance due to own functional limitations. Sister from [place] to come in to provide minimal assistance as needed. Son at home helping spouse (post hip surgery) and pt's sister plans to come in from [place] to assist with 24/7 care as needed          Discharge Planning/Teaching Needs:  Home with spouse (recently had hip surgery) and sister coming in from [place] to assist with patient care  TBD   Team Discussion: Can take down drsg per orthopedics, pain controlled, monitoring BP, bowel meds stopped, inc/loose stool.  RN issue with decision making, IV out.  OT min/CGA sit to stand and stand pivot, TDWB R foot, stood S/CGA, S dressing, goals S.  PT CGA transfers, S goals, husband wants OP therapy, but question if patient is ready for OP therapy.  Sister can assist at home. SW to set up fam ed for next Mon/Tues.   Revisions to Treatment Plan: N/A     Medical Summary Current Status: Debility secondary to osteomyelitis septic arthritis left fifth metatarsal joint, abscess and ulcer in the setting of peripheral vascular disease status post left CFA enterectomy and femoral to below-knee popliteal bypass 03/28/2019 as well as left fifth ray amputation 04/06/2019 Weekly Focus/Goal: Improve mobility, back pain, drug induced constipation, HTN, pain  Barriers to Discharge: Medical stability;Wound care;Weight bearing restrictions;Incontinence   Possible Resolutions to Barriers: Therapies, optimize pain meds, now with loose stools - bowel meds d/ced, optimize BP meds   Continued Need for Acute Rehabilitation Level of Care: The patient requires daily medical management by a physician with specialized training in physical medicine and rehabilitation for the following  reasons: Direction of a multidisciplinary physical rehabilitation program to maximize functional independence : Yes Medical management of patient stability for increased activity during participation in an intensive rehabilitation regime.: Yes Analysis of laboratory values and/or radiology reports with any subsequent need for medication adjustment and/or medical intervention. : Yes   I attest that I was present, lead the team conference, and concur with the assessment and plan of the team.   Retta Diones 04/13/2019, 8:44 PM   Team conference was held via web/ teleconference due to [place] - 19

## 2019-04-13 NOTE — Progress Notes (Signed)
Team Conference Report to Patient/Family  Team Conference discussion was reviewed with the patient and caregiver, including goals, any changes in plan of care and target discharge date.  Patient and caregiver express understanding and are in agreement.  The patient has a target discharge date of 04/20/19 at a supervision overall level  . Patient noted preference for [facility]; OT and PT referral made [name] 04/13/2019, 4:12 PM

## 2019-04-14 ENCOUNTER — Inpatient Hospital Stay (HOSPITAL_COMMUNITY): Payer: Medicare Other | Admitting: Occupational Therapy

## 2019-04-14 ENCOUNTER — Inpatient Hospital Stay (HOSPITAL_COMMUNITY): Payer: Medicare Other

## 2019-04-14 LAB — CBC
HCT: 29.5 % — ABNORMAL LOW (ref 36.0–46.0)
Hemoglobin: 9 g/dL — ABNORMAL LOW (ref 12.0–15.0)
MCH: 31.5 pg (ref 26.0–34.0)
MCHC: 30.5 g/dL (ref 30.0–36.0)
MCV: 103.1 fL — ABNORMAL HIGH (ref 80.0–100.0)
Platelets: 225 10*3/uL (ref 150–400)
RBC: 2.86 MIL/uL — ABNORMAL LOW (ref 3.87–5.11)
RDW: 17.9 % — ABNORMAL HIGH (ref 11.5–15.5)
WBC: 6 10*3/uL (ref 4.0–10.5)
nRBC: 0 % (ref 0.0–0.2)

## 2019-04-14 LAB — RENAL FUNCTION PANEL
Albumin: 2.6 g/dL — ABNORMAL LOW (ref 3.5–5.0)
Anion gap: 13 (ref 5–15)
BUN: 53 mg/dL — ABNORMAL HIGH (ref 8–23)
CO2: 25 mmol/L (ref 22–32)
Calcium: 7.9 mg/dL — ABNORMAL LOW (ref 8.9–10.3)
Chloride: 96 mmol/L — ABNORMAL LOW (ref 98–111)
Creatinine, Ser: 6.03 mg/dL — ABNORMAL HIGH (ref 0.44–1.00)
GFR calc Af Amer: 7 mL/min — ABNORMAL LOW (ref >60)
GFR calc non Af Amer: 6 mL/min — ABNORMAL LOW (ref >60)
Glucose, Bld: 162 mg/dL — ABNORMAL HIGH (ref 70–99)
Phosphorus: 3.9 mg/dL (ref 2.5–4.6)
Potassium: 4.1 mmol/L (ref 3.5–5.1)
Sodium: 134 mmol/L — ABNORMAL LOW (ref 135–145)

## 2019-04-14 LAB — GLUCOSE, CAPILLARY
Glucose-Capillary: 102 mg/dL — ABNORMAL HIGH (ref 70–99)
Glucose-Capillary: 141 mg/dL — ABNORMAL HIGH (ref 70–99)
Glucose-Capillary: 88 mg/dL (ref 70–99)
Glucose-Capillary: 113 mg/dL — ABNORMAL HIGH (ref 70–99)

## 2019-04-14 LAB — IRON AND TIBC
Iron: 39 ug/dL (ref 28–170)
Saturation Ratios: 15 % (ref 10.4–31.8)
TIBC: 263 ug/dL (ref 250–450)
UIBC: 224 ug/dL

## 2019-04-14 LAB — FERRITIN: Ferritin: 332 ng/mL — ABNORMAL HIGH (ref 11–307)

## 2019-04-14 MED ORDER — LIDOCAINE-PRILOCAINE 2.5-2.5 % EX CREA
1.0000 "application " | TOPICAL_CREAM | CUTANEOUS | Status: DC | PRN
  Filled 2019-04-14: qty 5

## 2019-04-14 MED ORDER — PENTAFLUOROPROP-TETRAFLUOROETH EX AERO: 1.0000 "application " | INHALATION_SPRAY | CUTANEOUS | Status: DC | PRN

## 2019-04-14 MED ORDER — LIDOCAINE HCL (PF) 1 % IJ SOLN
5.0000 mL | INTRAMUSCULAR | Status: DC | PRN
  Filled 2019-04-14: qty 5

## 2019-04-14 MED ORDER — HEPARIN SODIUM (PORCINE) 1000 UNIT/ML DIALYSIS
2500.0000 [IU] | Freq: Once | INTRAMUSCULAR | Status: AC
  Filled 2019-04-14: qty 3

## 2019-04-14 MED ORDER — SODIUM CHLORIDE 0.9 % IV SOLN: 100.0000 mL | INTRAVENOUS | Status: DC | PRN

## 2019-04-14 MED ORDER — DOXERCALCIFEROL 4 MCG/2ML IV SOLN
INTRAVENOUS | Status: AC
  Administered 2019-04-14: 4 ug via INTRAVENOUS
  Filled 2019-04-14: qty 2

## 2019-04-14 MED ORDER — HEPARIN SODIUM (PORCINE) 1000 UNIT/ML IJ SOLN
INTRAMUSCULAR | Status: AC
  Administered 2019-04-14: 2500 [IU] via INTRAVENOUS_CENTRAL
  Filled 2019-04-14: qty 3

## 2019-04-14 NOTE — Progress Notes (Signed)
Physical Therapy Session Note  Patient Details  Name: [name] MRN: [id number] Date of Birth: [birth year]  Today's Date: 04/14/2019 PT Individual Time: 0915-1010 and 2751-7001  PT Individual Time Calculation (min): 55 min and 69 min  Short Term Goals: Week 1:  PT Short Term Goal 1 (Week 1): Pt will perform bed mobility CGA PT Short Term Goal 2 (Week 1): Pt will perform stand<>pivot transfer with LRAD CGA PT Short Term Goal 3 (Week 1): Pt will ambulate 41ft with LRAD mod A  Skilled Therapeutic Interventions/Progress Updates:   Treatment Session 1: 0915-1010 55 min Received pt sitting in WC, pt agreeable to therapy, and denied pain during session. Session focused on functional mobility/transfers, simulated car transfer, furniture transfer, dynamic standing balance, LE strength, and improved endurance with activity. Pt performed WC mobility 156ft with bilateral UE and supervision. Pt performed car transfer from SUV height with min A without AD and extended time. Pt required verbal cues for technique and pivoting on RLE to maintain LLE TDWB precautions. Pt performed furniture transfer onto couch with RW CGA, however required min A to get up from couch due to low surface. Pt reported urge to have BM and was transported back to room in [facility] total assist. Pt performed toilet transfer stand<>pivot with grab bars CGA and was able to pull pants down with heavy CGA. Pt transferred sit<>stand from toilet with grab bars CGA, but required total assist for hygiene and min A for clothing management. Pt washed hands at sink seated in [facility] with supervision. Pt requested to rest in bed at end of session. Pt continues to require frequent extended rest breaks throughout sessions. Stand<>pivot WC<>bed CGA and sit<>supine with supervision and increased time. Concluded session with pt supine in bed, needs within reach, bed alarm on. Therapist assisted with repositioning for pt comfort.   Treatment Session 2: 1045-1154 69  min Received pt supine in bed, pt agreeable to therapy, and did not state pain level. Session focused on functional mobility/transfers, dynamic standing balance, LE strength, and improved endurance with activity. Pt performed bed mobility with supervision and increased time. Pt transferred stand<>pivot bed<>WC CGA witth RW. Pt declined performing WC mobility due to fatigue and was transported to dayroom in [facility] total assist. Pt performed seated RLE strengthening on Kinetron at 20cm/sec 1x10 and 30cm/sec 3x10 with therapist proving manual resistance. Pt required verbal cues for breathing techniques and to lean forward to avoid resting back against WC. Pt required multiple rest and water breaks in between sets due to increased fatigue from previous session. Pt transferred stand<>pivot with RW CGA to Nustep and performed bilateral UE strengthening for 5 minutes on workload 4 for total of 192 steps. Pt requested laying down exercises and transferred stand<>pivot WC<>mat with RW CGA and sit<>supine with supervision. In supine pt performed the following exercises: -Bilateral SLR x12 -Hip abduction with orange TB 2x8 with verbal cues for technique -Hip adduction ball squeezes 2x8 with 3 second hold Pt transported back to room in [facility] total assist. Stand<>pivot WC<>bed without AD CGA. Concluded session with pt supine in bed, needs within reach, and bed alarm on.   Therapy Documentation Precautions:  Precautions Precautions: Fall Precaution Comments: LLE TDWB Restrictions Weight Bearing Restrictions: Yes LLE Weight Bearing: Touchdown weight bearing Other Position/Activity Restrictions: TDWB through heel per ortho note  Therapy/Group: Individual Therapy Alfonse Alpers PT, DPT   04/14/2019, 7:48 AM

## 2019-04-14 NOTE — Progress Notes (Signed)
Occupational Therapy Session Note  Patient Details  Name: [name] MRN: [id number] Date of Birth: 1945/07/22  Today's Date: 04/14/2019 OT Individual Time: 5852-7782 OT Individual Time Calculation (min): 57 min   Short Term Goals: Week 1:  OT Short Term Goal 1 (Week 1): STG = LTGs due to ELOS  Skilled Therapeutic Interventions/Progress Updates:    Pt greeted returned from bathroom with nurse tech and agreeable to OT treatment session focused on self-care retraining. Pt requested to wash her hair today. OT obtained wc level hair washing tray and assisted pt with washing hair at sink level. Pt reports usually standing up at the sink to wash hair at home an sponge bathes. Pt reports she does not shower. Discussed TDWB and likely pt could not safely do this and maintain WB precaution. OT educated on where she could get the hair washing bin, or she could try to lean back at the sink at home and have family assist. Pt stated she will likely just wait until her weight bearing is upgraded to wash her hair again, but appreciative of OT suggestions. Pt completed other grooming tasks and UB bathing at the sink. Pt declined LB bathing or to change clothes 2/2 wanting to wait until her husband brought her more clothes. Worked on donning post op shoes with pt able to achieve figure 4 position with R LE to don R shoe, then able to tolerate figure 4 with LLE for short time, but still needed OT assist to tighten straps on shoe. Pt agreeable to stay up in wc until next therapy session. Pt left with chair alarm on, call bell in reach, and needs met.   Therapy Documentation Precautions:  Precautions Precautions: Fall Precaution Comments: LLE TDWB Restrictions Weight Bearing Restrictions: Yes LLE Weight Bearing: Touchdown weight bearing Other Position/Activity Restrictions: TDWB through heel per ortho note Pain:   denies pain at this time  Therapy/Group: Individual Therapy  Valma Cava 04/14/2019,  8:10 AM

## 2019-04-14 NOTE — Procedures (Addendum)
Seen and examined on dialysis. Procedure supervised.  LUE AVF in use.  Blood pressure 119/65 and HR 82.  Tolerating goal.  No heparin with HD today  Claudia Desanctis, MD 04/14/2019  2:20 PM

## 2019-04-14 NOTE — Progress Notes (Signed)
[facility] PHYSICAL MEDICINE & REHABILITATION PROGRESS NOTE  Subjective/Complaints: Patient seen sitting up this morning.  She states she slept well overnight.  She notes that overall her bowel movements are improving.  She is agreeable to her discharge date.  She was seen by nephrology yesterday, notes reviewed.  ROS: Denies CP, SOB, N/V/D  Objective: Vital Signs: Blood pressure (!) 119/53, pulse 78, temperature 98 F (36.7 C), temperature source Oral, resp. rate 18, height 5' (1.524 m), weight 66.1 kg, SpO2 94 %. No results found. No results for input(s): WBC, HGB, HCT, PLT in the last 72 hours. No results for input(s): NA, K, CL, CO2, GLUCOSE, BUN, CREATININE, CALCIUM in the last 72 hours.  Physical Exam: BP (!) 119/53 (BP Location: Left Arm)   Pulse 78   Temp 98 F (36.7 C) (Oral)   Resp 18   Ht 5' (1.524 m)   Wt 66.1 kg   SpO2 94%   BMI 28.46 kg/m  Constitutional: No distress . Vital signs reviewed.  Obese. HENT: Normocephalic.  Atraumatic. Eyes: EOMI. No discharge. Cardiovascular: No JVD. Respiratory: Normal effort.  No stridor. GI: Non-distended. Skin: Left lower extremity dressing Psych: Normal mood.  Normal behavior. Musc: Left foot edema Neuro: Alert Motor: B/l UE, RLE: 5/5 proximal to distal LLE: HF, KE 5/5, ankle limited due pain  Assessment/Plan: 1. Functional deficits secondary to debility which require 3+ hours per day of interdisciplinary therapy in a comprehensive inpatient rehab setting.  Physiatrist is providing close team supervision and 24 hour management of active medical problems listed below.  Physiatrist and rehab team continue to assess barriers to discharge/monitor patient progress toward functional and medical goals  Care Tool:  Bathing    Body parts bathed by patient: Right arm, Left arm, Chest, Abdomen, Front perineal area, Face   Body parts bathed by helper: Buttocks     Bathing assist Assist Level: Minimal Assistance - Patient >  75%     Upper Body Dressing/Undressing Upper body dressing Upper body dressing/undressing activity did not occur (including orthotics): N/A What is the patient wearing?: Pull over shirt    Upper body assist Assist Level: Set up assist    Lower Body Dressing/Undressing Lower body dressing    Lower body dressing activity did not occur: N/A What is the patient wearing?: Pants     Lower body assist Assist for lower body dressing: Contact Guard/Touching assist     Toileting Toileting    Toileting assist Assist for toileting: Minimal Assistance - Patient > 75%     Transfers Chair/bed transfer  Transfers assist  Chair/bed transfer activity did not occur: Safety/medical concerns  Chair/bed transfer assist level: Contact Guard/Touching assist Chair/bed transfer assistive device: Programmer, multimedia   Ambulation assist      Assist level: Minimal Assistance - Patient > 75% Assistive device: Walker-rolling Max distance: 52ft   Walk 10 feet activity   Assist  Walk 10 feet activity did not occur: Safety/medical concerns(LLE TDWB status, LE weakness, fatigue)        Walk 50 feet activity   Assist Walk 50 feet with 2 turns activity did not occur: Safety/medical concerns(LLE TDWB status, LE weakness, fatigue)         Walk 150 feet activity   Assist Walk 150 feet activity did not occur: Safety/medical concerns(LLE TDWB status, LE weakness, fatigue)         Walk 10 feet on uneven surface  activity   Assist Walk 10 feet on uneven surfaces  activity did not occur: Safety/medical concerns(LLE TDWB status, LE weakness, fatigue)         Wheelchair     Assist Will patient use wheelchair at discharge?: Yes Type of Wheelchair: Manual    Wheelchair assist level: Supervision/Verbal cueing Max wheelchair distance: 62ft    Wheelchair 50 feet with 2 turns activity    Assist    Wheelchair 50 feet with 2 turns activity did not occur:  (fatigue, increased R shoulder pain)   Assist Level: Supervision/Verbal cueing   Wheelchair 150 feet activity     Assist Wheelchair 150 feet activity did not occur: (fatigue, increased R shoulder pain)   Assist Level: Maximal Assistance - Patient 25 - 49%      Medical Problem List and Plan: 1.  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  Touchdown weightbearing left lower extremity, just for balance  Continue CIR 2.  Antithrombotics: -DVT/anticoagulation: ?None needed after recent peripheral vascular surgery             -antiplatelet therapy: Aspirin 81 mg daily 3. Pain Management: Flexeril and hydrocodone as needed  Controlled on 1/7 with medications  Monitor with increased mobility 4. Mood: Trazodone nightly.  Provide emotional support             -antipsychotic agents: N/A 5. Neuropsych: This patient is capable of making decisions on her own behalf. 6. Skin/Wound Care: Routine skin checks for incisions from fempop bypass and L 5th ray amputation  Continue dressing changes with gauze, kerlix, and ace wrap  Monitor for wound healing 7. Fluids/Electrolytes/Nutrition: Routine in and outs 8.  ESRD.  Continue hemodialysis as directed- T/H/S 9. Hypertension.  Hydralazine 25 mg every 8 hours, Isordil 10 mg 3 times daily.    Relatively controlled on 1/7  Monitor with increased mobility 10.  Hyperlipidemia.  Lipitor 11.  History of gout.  Continue Zyloprim twice daily.  Monitor for gout flareups. 12.  Diabetes mellitus type II with hyperglycemia.  Levemir 10 units daily.  Check blood sugars before meals and at bedtime   CBG (last 3)  Recent Labs    04/13/19 1633 04/13/19 2127 04/14/19 0617  GLUCAP 102* 92 102*   Relatively controlled on  1/7  Monitor with increased mobility 13. Osteomyelitis- Completed course of IV ABX  14.  Acute on chronic anemia- restart Iron per Nephro  Hemoglobin 8.2 on 1/4  Continue to monitor 15. Drug induced constipation, now with bowel incontinence-improving  KUB personally reviewed, suggesting constipation  Bowel meds increased on 1/1, adjusted further 1/4, d/ced on 1/6  Will consider Fiber  Hemorrhoid cream for hemorrhoid Sx's  16. Leukocytosis: Resolved  WBC 7.3 on 1/4 17. Sciatic/piriformis syndrome  -tennis ball- will get family to bring in for her.  Lidocaine patch ordered  See #3  LOS: 7 days A FACE TO FACE EVALUATION WAS PERFORMED  Mechele Kittleson Lorie Phenix 04/14/2019, 8:53 AM

## 2019-04-15 ENCOUNTER — Inpatient Hospital Stay (HOSPITAL_COMMUNITY): Payer: Medicare Other

## 2019-04-15 ENCOUNTER — Inpatient Hospital Stay (HOSPITAL_COMMUNITY): Payer: Medicare Other | Admitting: Occupational Therapy

## 2019-04-15 LAB — GLUCOSE, CAPILLARY
Glucose-Capillary: 97 mg/dL (ref 70–99)
Glucose-Capillary: 132 mg/dL — ABNORMAL HIGH (ref 70–99)
Glucose-Capillary: 145 mg/dL — ABNORMAL HIGH (ref 70–99)
Glucose-Capillary: 129 mg/dL — ABNORMAL HIGH (ref 70–99)
Glucose-Capillary: 116 mg/dL — ABNORMAL HIGH (ref 70–99)

## 2019-04-15 MED ORDER — CHLORHEXIDINE GLUCONATE CLOTH 2 % EX PADS
6.0000 | MEDICATED_PAD | Freq: Every day | CUTANEOUS | Status: DC
  Administered 2019-04-18: 6 via TOPICAL

## 2019-04-15 NOTE — Progress Notes (Signed)
Physical Therapy Weekly Progress Note  Patient Details  Name: [name] MRN: [id number] Date of Birth: 09-27-1945  Beginning of progress report period: April 08, 2019 End of progress report period: April 15, 2019  Today's Date: 04/15/2019 PT Individual Time: 1350-1500 PT Individual Time Calculation (min): 70 min   Patient has met 2 of 3 short term goals. Pt continues to demonstrate improvements with maintaining LLE TDWB status, functional mobility, transfers, LE strength, dynamic standing balance, and endurance with activity. Pt currently able to perform bed mobility with supervision, stand<>pivot transfers with RW CGA, WC mobility 48f with bilateral UE and supervision, and simulated car transfer with RW min A. However, pt continues to demonstrate difficulty with maintaining LLE TDWB precautions and is easily fatigued with activity.   Patient continues to demonstrate the following deficits muscle weakness and decreased standing balance, decreased postural control, decreased balance strategies and difficulty maintaining precautions and therefore will continue to benefit from skilled PT intervention to increase functional independence with mobility.  Patient progressing toward long term goals.. Continue plan of care.  PT Short Term Goals Week 1:  PT Short Term Goal 1 (Week 1): Pt will perform bed mobility CGA PT Short Term Goal 1 - Progress (Week 1): Met PT Short Term Goal 2 (Week 1): Pt will perform stand<>pivot transfer with LRAD CGA PT Short Term Goal 2 - Progress (Week 1): Met PT Short Term Goal 3 (Week 1): Pt will ambulate 142fwith LRAD mod A PT Short Term Goal 3 - Progress (Week 1): Not met Week 2:  PT Short Term Goal 1 (Week 2): STG=LTG due to LOS  Skilled Therapeutic Interventions/Progress Updates:  Ambulation/gait training;Discharge planning;Functional mobility training;Therapeutic Activities;Balance/vestibular training;Disease management/prevention;Neuromuscular  re-education;Skin care/wound management;Therapeutic Exercise;Wheelchair propulsion/positioning;DME/adaptive equipment instruction;Pain management;Splinting/orthotics;UE/LE Strength taining/ROM;Community reintegration;Functional electrical stimulation;Patient/family education;Stair training;UE/LE Coordination activities   Today's Interventions: Received pt sitting in WC, pt agreeable to therapy, and did not state pain during session. Session focused on functional mobility/transfers, LE strength, balance/coordination, and improved endurance with activity. Pt requested to begin therapy in room in case she felt urge to have BM. Pt performed sit<>stands with RW x 5 reps CGA. Pt performed standing LLE hip and knee flexion 2x10 with bilateral UE support on RW CGA. Pt transferred stand<>pivot WC<>mat with RW CGA. Pt performed seated bicep curls with 3lb weight and overhead shoudler press with 2lb weight on LUE 2x5 and RUE 1x5. Pt performed standing LLE hip abduction 2x10 with bilateral UE support on RW CGA. Pt performed standing RLE single leg balance x1 minute with and without UE support on RW CGA/min A. Pt performed standing horseshoes toss x2 trials with RW CGA. Pt performed car transfer with RW from SUV height. Concluded session with pt supine in bed, needs within reach, and bed alarm on.   Therapy Documentation Precautions:  Precautions Precautions: Fall Precaution Comments: LLE TDWB Restrictions Weight Bearing Restrictions: Yes LLE Weight Bearing: Touchdown weight bearing Other Position/Activity Restrictions: TDWB through heel per ortho note  Therapy/Group: Individual Therapy AnAlfonse AlpersT, DPT   04/15/2019, 7:44 AM

## 2019-04-15 NOTE — Progress Notes (Signed)
Physical Therapy Session Note  Patient Details  Name: [name] MRN: [id number] Date of Birth: 1946/02/25  Today's Date: 04/15/2019 PT Individual Time: 0840-0940 PT Individual Time Calculation (min): 60 min   Short Term Goals: Week 1:  PT Short Term Goal 1 (Week 1): Pt will perform bed mobility CGA PT Short Term Goal 1 - Progress (Week 1): Met PT Short Term Goal 2 (Week 1): Pt will perform stand<>pivot transfer with LRAD CGA PT Short Term Goal 2 - Progress (Week 1): Met PT Short Term Goal 3 (Week 1): Pt will ambulate 71f with LRAD mod A PT Short Term Goal 3 - Progress (Week 1): Not met Week 2:  PT Short Term Goal 1 (Week 2): STG=LTG due to LOS  Skilled Therapeutic Interventions/Progress Updates:   Session focused on functional transfer training with and without AD, toilet transfers and dynamic balance to perform clothing management and hygiene (+BM), and dynamic standing balance and tolerance activities to perform functional reaching task. Pt required min assist for initial sit-> stand from EOB but throughout session progressed to CGA to close supervision with occasional cues for improved technique. Pt with limited ability to fully clear LE during stand step transfers, more of a pivot on RLE. During toileting, performed transfers with CGA and able to maintain dynamic standing balance with close supervision/CGA while performing hygiene and clothing management. Blocked practice squat pivot transfers x 3 w/c <> mat with focus on w/c parts management and set up for transfer in preparation for times when pt does not have RW available as anticipate she will be using w/c for primary mobility in the home. Pt able to perform functional dynamic standing balance while reaching in both directions and various heights with close supervision to CGA. End of session transferred back to bed with CGA with RW and repositioned in supine with supervision.   Therapy Documentation Precautions:   Precautions Precautions: Fall Precaution Comments: LLE TDWB Restrictions Weight Bearing Restrictions: Yes LLE Weight Bearing: Touchdown weight bearing Other Position/Activity Restrictions: TDWB through heel per ortho note  Pain:  Denies pain.   Therapy/Group: Individual Therapy  GCanary BrimBIvory Broad PT, DPT, CBIS  04/15/2019, 9:48 AM

## 2019-04-15 NOTE — Progress Notes (Signed)
Occupational Therapy Weekly Progress Note  Patient Details  Name: [name] MRN: [id number] Date of Birth: November 28, 1945  Beginning of progress report period: April 08, 2019 End of progress report period: April 15, 2019  Today's Date: 04/15/2019 OT Individual Time: 1030-1130 OT Individual Time Calculation (min): 60 min    Short term goals not set due to estimated length of stay.  Pt is making steady progress towards goals. Pt currently requires Min assist to [place] for stand pivot transfers with and without AD.  Pt continues to require cues to maintain TDWB of LLE during transfers and standing.  Pt is able to complete bathing and LB dressing at CGA and supervision/setup for grooming and UB dressing.  Pt will require supervision at home with transfers and dynamic standing balance due to difficulty maintaining WB precautions.  Patient continues to demonstrate the following deficits: muscle weakness, decreased cardiorespiratoy endurance and decreased standing balance, decreased balance strategies and difficulty maintaining precautions and therefore will continue to benefit from skilled OT intervention to enhance overall performance with BADL and Reduce care partner burden.  See Patient's Care Plan for progression toward long term goals.  Patient progressing toward long term goals..  Continue plan of care.  Skilled Therapeutic Interventions/Progress Updates:    Treatment session with focus on functional transfers, sit > stand, and standing endurance.  Pt received supine in bed agreeable to therapy session.  Pt completed bed mobility supervision and stand pivot transfer without AD to w/c with CGA.  Pt demonstrating increased weight shifting and ability to maintain WB precaution during transfer this session.  Engaged in grooming and UB bathing seated at sink with increased time.  Pt propelled w/c to Dayroom for BUE strengthening.  Engaged in table top task in standing with focus on sit <> stand and  standing balance/endurance while maintaining WB precautions.  Pt overall CGA throughout session.  Returned to room and remained upright in w/c with all needs in reach and lunch tray set up.  Therapy Documentation Precautions:  Precautions Precautions: Fall Precaution Comments: LLE TDWB Restrictions Weight Bearing Restrictions: Yes LLE Weight Bearing: Touchdown weight bearing Other Position/Activity Restrictions: TDWB through heel per ortho note Pain:  Pt with no c/o pain   Therapy/Group: Individual Therapy  Simonne Come 04/15/2019, 12:20 PM

## 2019-04-15 NOTE — Progress Notes (Signed)
[place] PHYSICAL MEDICINE & REHABILITATION PROGRESS NOTE  Subjective/Complaints: Patient seen sitting up in bed this morning.  She states she slept well overnight.  She denies complaints.  She is seen by nephrology yesterday, notes reviewed.  ROS: Denies CP, SOB, N/V/D  Objective: Vital Signs: Blood pressure (!) 128/55, pulse 86, temperature 98.3 F (36.8 C), temperature source Oral, resp. rate 16, height 5' (1.524 m), weight 68.2 kg, SpO2 97 %. No results found. Recent Labs    04/14/19 1430  WBC 6.0  HGB 9.0*  HCT 29.5*  PLT 225   Recent Labs    04/14/19 1430  NA 134*  K 4.1  CL 96*  CO2 25  GLUCOSE 162*  BUN 53*  CREATININE 6.03*  CALCIUM 7.9*    Physical Exam: BP (!) 128/55 (BP Location: Left Arm)   Pulse 86   Temp 98.3 F (36.8 C) (Oral)   Resp 16   Ht 5' (1.524 m)   Wt 68.2 kg   SpO2 97%   BMI 29.36 kg/m  Constitutional: No distress . Vital signs reviewed.  Obese. HENT: Normocephalic.  Atraumatic. Eyes: EOMI. No discharge. Cardiovascular: No JVD. Respiratory: Normal effort.  No stridor. GI: Non-distended. Skin: Left lower extremity incision with sutures C/D/I Psych: Normal mood.  Normal behavior. Musc: Left lateral foot edema and tenderness Neuro: Alert Motor: B/l UE, RLE: 5/5 proximal to distal, unchanged LLE: HF, KE 5/5, ADF 4/5, some pain inhibition)  Assessment/Plan: 1. Functional deficits secondary to debility which require 3+ hours per day of interdisciplinary therapy in a comprehensive inpatient rehab setting.  Physiatrist is providing close team supervision and 24 hour management of active medical problems listed below.  Physiatrist and rehab team continue to assess barriers to discharge/monitor patient progress toward functional and medical goals  Care Tool:  Bathing    Body parts bathed by patient: Right arm, Left arm, Chest, Abdomen, Front perineal area, Face   Body parts bathed by helper: Buttocks     Bathing assist Assist  Level: Minimal Assistance - Patient > 75%     Upper Body Dressing/Undressing Upper body dressing Upper body dressing/undressing activity did not occur (including orthotics): N/A What is the patient wearing?: Pull over shirt    Upper body assist Assist Level: Set up assist    Lower Body Dressing/Undressing Lower body dressing    Lower body dressing activity did not occur: N/A What is the patient wearing?: Pants     Lower body assist Assist for lower body dressing: Contact Guard/Touching assist     Toileting Toileting    Toileting assist Assist for toileting: Contact Guard/Touching assist     Transfers Chair/bed transfer  Transfers assist  Chair/bed transfer activity did not occur: Safety/medical concerns  Chair/bed transfer assist level: Contact Guard/Touching assist Chair/bed transfer assistive device: Armrests, Programmer, multimedia   Ambulation assist      Assist level: Minimal Assistance - Patient > 75% Assistive device: Walker-rolling Max distance: 19ft   Walk 10 feet activity   Assist  Walk 10 feet activity did not occur: Safety/medical concerns(LLE TDWB status, LE weakness, fatigue)        Walk 50 feet activity   Assist Walk 50 feet with 2 turns activity did not occur: Safety/medical concerns(LLE TDWB status, LE weakness, fatigue)         Walk 150 feet activity   Assist Walk 150 feet activity did not occur: Safety/medical concerns(LLE TDWB status, LE weakness, fatigue)  Walk 10 feet on uneven surface  activity   Assist Walk 10 feet on uneven surfaces activity did not occur: Safety/medical concerns(LLE TDWB status, LE weakness, fatigue)         Wheelchair     Assist Will patient use wheelchair at discharge?: Yes Type of Wheelchair: Manual    Wheelchair assist level: Supervision/Verbal cueing Max wheelchair distance: 140ft    Wheelchair 50 feet with 2 turns activity    Assist    Wheelchair 50  feet with 2 turns activity did not occur: (fatigue, increased R shoulder pain)   Assist Level: Supervision/Verbal cueing   Wheelchair 150 feet activity     Assist Wheelchair 150 feet activity did not occur: (fatigue, increased R shoulder pain)   Assist Level: Maximal Assistance - Patient 25 - 49%      Medical Problem List and Plan: 1.  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  Touchdown weightbearing left lower extremity, just for balance  Continue CIR 2.  Antithrombotics: -DVT/anticoagulation: ?None needed after recent peripheral vascular surgery             -antiplatelet therapy: Aspirin 81 mg daily 3. Pain Management: Flexeril and hydrocodone as needed  Controlled on 1/8 with medications  Monitor with increased mobility 4. Mood: Trazodone nightly.  Provide emotional support             -antipsychotic agents: N/A 5. Neuropsych: This patient is capable of making decisions on her own behalf. 6. Skin/Wound Care: Routine skin checks for incisions from fempop bypass and L 5th ray amputation  Continue dressing changes with gauze, kerlix, and ace wrap  Monitor for wound healing 7. Fluids/Electrolytes/Nutrition: Routine in and outs 8.  ESRD.  Continue hemodialysis as directed- T/H/S 9. Hypertension.  Hydralazine 25 mg every 8 hours, Isordil 10 mg 3 times daily.    Relatively controlled on 1/8  Monitor with increased mobility 10.  Hyperlipidemia.  Lipitor 11.  History of gout.  Continue Zyloprim twice daily.  Monitor for gout flareups. 12.  Diabetes mellitus type II with hyperglycemia.  Levemir 10 units daily.  Check blood sugars before meals and at bedtime   CBG (last 3)  Recent Labs    04/14/19 1825 04/14/19 2152 04/15/19 0602  GLUCAP 88  113* 97   Labile on 1/8, monitor for trend  Monitor with increased mobility 13. Osteomyelitis- Completed course of IV ABX  14.  Acute on chronic anemia- restart Iron per Nephro  Hemoglobin 9.3 on 1/7  Continue to monitor 15. Drug induced constipation  KUB personally reviewed, suggesting constipation  Bowel meds increased on 1/1, adjusted further 1/4, d/ced on 1/6  Will consider Fiber  Hemorrhoid cream for hemorrhoid Sx's   Improving 16. Leukocytosis: Resolved  WBC 7.3 on 1/4 17. Sciatic/piriformis syndrome  -tennis ball- will get family to bring in for her.  Lidocaine patch ordered  See #3  LOS: 8 days A FACE TO FACE EVALUATION WAS PERFORMED  Gavino Fouch Lorie Phenix 04/15/2019, 9:50 AM

## 2019-04-15 NOTE — Plan of Care (Signed)
  Problem: RH Ambulation Goal: LTG Patient will ambulate in controlled environment (PT) Description: LTG: Patient will ambulate in a controlled environment, # of feet with assistance (PT). Outcome: Not Applicable Flowsheets (Taken 04/15/2019 0745) LTG: Pt will ambulate in controlled environ  assist needed:: (D/C) -- Note: D/C Goal: LTG Patient will ambulate in home environment (PT) Description: LTG: Patient will ambulate in home environment, # of feet with assistance (PT). Outcome: Not Applicable Flowsheets (Taken 04/15/2019 0745) LTG: Pt will ambulate in home environ  assist needed:: (D/C) -- Note: D/C

## 2019-04-16 ENCOUNTER — Inpatient Hospital Stay (HOSPITAL_COMMUNITY): Payer: Medicare Other | Admitting: Occupational Therapy

## 2019-04-16 LAB — GLUCOSE, CAPILLARY
Glucose-Capillary: 117 mg/dL — ABNORMAL HIGH (ref 70–99)
Glucose-Capillary: 163 mg/dL — ABNORMAL HIGH (ref 70–99)
Glucose-Capillary: 92 mg/dL (ref 70–99)
Glucose-Capillary: 76 mg/dL (ref 70–99)

## 2019-04-16 MED ORDER — GABAPENTIN 100 MG PO CAPS
100.0000 mg | ORAL_CAPSULE | Freq: Two times a day (BID) | ORAL | Status: DC
  Administered 2019-04-16 – 2019-04-20 (×9): 100 mg via ORAL
  Filled 2019-04-16 (×9): qty 1

## 2019-04-16 NOTE — Progress Notes (Signed)
[place] PHYSICAL MEDICINE & REHABILITATION PROGRESS NOTE  Subjective/Complaints:  No issues overnite except for "sciatic pain"  Has this bilaterally, hx severe diabetic neuropathy  ROS: Denies CP, SOB, N/V/D  Objective: Vital Signs: Blood pressure 130/66, pulse 88, temperature 98.8 F (37.1 C), resp. rate 16, height 5' (1.524 m), weight 68.2 kg, SpO2 95 %. No results found. Recent Labs    04/14/19 1430  WBC 6.0  HGB 9.0*  HCT 29.5*  PLT 225   Recent Labs    04/14/19 1430  NA 134*  K 4.1  CL 96*  CO2 25  GLUCOSE 162*  BUN 53*  CREATININE 6.03*  CALCIUM 7.9*    Physical Exam: BP 130/66 (BP Location: Left Arm)   Pulse 88   Temp 98.8 F (37.1 C)   Resp 16   Ht 5' (1.524 m)   Wt 68.2 kg   SpO2 95%   BMI 29.36 kg/m  Constitutional: No distress . Vital signs reviewed.  Obese. HENT: Normocephalic.  Atraumatic. Eyes: EOMI. No discharge. Cardiovascular: No JVD. Respiratory: Normal effort.  No stridor. GI: Non-distended. Skin: Left lower extremity incision with sutures C/D/I Psych: Normal mood.  Normal behavior. Musc: Left lateral foot edema and tenderness Neuro: Alert Motor: B/l UE, RLE: 5/5 proximal to distal, unchanged LLE: HF, KE 5/5, ADF 4/5, some pain inhibition)  Assessment/Plan: 1. Functional deficits secondary to debility which require 3+ hours per day of interdisciplinary therapy in a comprehensive inpatient rehab setting.  Physiatrist is providing close team supervision and 24 hour management of active medical problems listed below.  Physiatrist and rehab team continue to assess barriers to discharge/monitor patient progress toward functional and medical goals  Care Tool:  Bathing    Body parts bathed by patient: Right arm, Left arm, Chest, Abdomen, Front perineal area, Face   Body parts bathed by helper: Buttocks     Bathing assist Assist Level: Minimal Assistance - Patient > 75%     Upper Body Dressing/Undressing Upper body dressing  Upper body dressing/undressing activity did not occur (including orthotics): N/A What is the patient wearing?: Pull over shirt    Upper body assist Assist Level: Set up assist    Lower Body Dressing/Undressing Lower body dressing    Lower body dressing activity did not occur: N/A What is the patient wearing?: Pants     Lower body assist Assist for lower body dressing: Contact Guard/Touching assist     Toileting Toileting Toileting Activity did not occur (Clothing management and hygiene only): Refused  Toileting assist Assist for toileting: Contact Guard/Touching assist     Transfers Chair/bed transfer  Transfers assist  Chair/bed transfer activity did not occur: Safety/medical concerns  Chair/bed transfer assist level: Contact Guard/Touching assist Chair/bed transfer assistive device: Walker, Clinical biochemist   Ambulation assist      Assist level: Minimal Assistance - Patient > 75% Assistive device: Walker-rolling Max distance: 56ft   Walk 10 feet activity   Assist  Walk 10 feet activity did not occur: Safety/medical concerns(LLE TDWB status, LE weakness, fatigue)        Walk 50 feet activity   Assist Walk 50 feet with 2 turns activity did not occur: Safety/medical concerns(LLE TDWB status, LE weakness, fatigue)         Walk 150 feet activity   Assist Walk 150 feet activity did not occur: Safety/medical concerns(LLE TDWB status, LE weakness, fatigue)         Walk 10 feet on uneven surface  activity  Assist Walk 10 feet on uneven surfaces activity did not occur: Safety/medical concerns(LLE TDWB status, LE weakness, fatigue)         Wheelchair     Assist Will patient use wheelchair at discharge?: Yes Type of Wheelchair: Manual    Wheelchair assist level: Supervision/Verbal cueing Max wheelchair distance: 177ft    Wheelchair 50 feet with 2 turns activity    Assist    Wheelchair 50 feet with 2 turns  activity did not occur: (fatigue, increased R shoulder pain)   Assist Level: Supervision/Verbal cueing   Wheelchair 150 feet activity     Assist Wheelchair 150 feet activity did not occur: (fatigue, increased R shoulder pain)   Assist Level: Maximal Assistance - Patient 25 - 49%      Medical Problem List and Plan: 1.  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  Touchdown weightbearing left lower extremity, just for balance  Continue CIR PT, OT  2.  Antithrombotics: -DVT/anticoagulation: ?None needed after recent peripheral vascular surgery             -antiplatelet therapy: Aspirin 81 mg daily 3. Pain Management: Flexeril and hydrocodone as needed  Controlled on 1/8 with medications  Complains of neuropathy pain an dsciatic , has not tried gabapentin but her mother was on this - will trial  4. Mood: Trazodone nightly.  Provide emotional support             -antipsychotic agents: N/A 5. Neuropsych: This patient is capable of making decisions on her own behalf. 6. Skin/Wound Care: Routine skin checks for incisions from fempop bypass and L 5th ray amputation  Continue dressing changes with gauze, kerlix, and ace wrap  Monitor for wound healing 7. Fluids/Electrolytes/Nutrition: Routine in and outs 8.  ESRD.  Continue hemodialysis as directed- T/H/S 9. Hypertension.  Hydralazine 25 mg every 8 hours, Isordil 10 mg 3 times daily.    Relatively controlled on 1/8  Monitor with increased mobility 10.  Hyperlipidemia.  Lipitor 11.  History of gout.  Continue Zyloprim twice daily.  Monitor for gout flareups. 12.  Diabetes mellitus type II with hyperglycemia.  Levemir 10 units daily.  Check blood sugars before meals and at bedtime   CBG (last 3)  Recent  Labs    04/15/19 2128 04/15/19 2211 04/16/19 0631  GLUCAP 129* 116* 117*   Labile on 1/8, monitor for trend  Monitor with increased mobility 13. Osteomyelitis- Completed course of IV ABX  14.  Acute on chronic anemia- restart Iron per Nephro  Hemoglobin 9.3 on 1/7  Continue to monitor 15. Drug induced constipation  KUB personally reviewed, suggesting constipation  Bowel meds increased on 1/1, adjusted further 1/4, d/ced on 1/6  Will consider Fiber  Hemorrhoid cream for hemorrhoid Sx's  Cont stool yesterday  16. Leukocytosis: Resolved  WBC 7.3 on 1/4 17. Sciatic/piriformis syndrome  -tennis ball- will get family to bring in for her.  Lidocaine patch ordered  See #3  LOS: 9 days A FACE TO [place] E Sohil Timko 04/16/2019, 11:23 AM

## 2019-04-16 NOTE — Progress Notes (Signed)
Occupational Therapy Session Note  Patient Details  Name: DEFNE GERLING MRN: [id number] Date of Birth: October 16, 1945  Today's Date: 04/16/2019 OT Individual Time: 7124-5809 OT Individual Time Calculation (min): 57 min   Short Term Goals: Week 2:  OT Short Term Goal 1 (Week 2): STG = LTGs due to remaining LOS  Skilled Therapeutic Interventions/Progress Updates:    Pt greeted in bed, wearing her orthotic shoes, and premedicated for pain. Agreeable to session. She states she sleeps on a couch at home due to her sciatica, no fall hx while sleeping on couch. Supine<sit from flat bed without bedrail on Lt completed without assistance. Stand pivot<w/c completed with close supervision assist and no device. While sitting at the sink, pt completed oral care, grooming tasks, and UB bathing at Mod I level. Discussed meal prep at home with pt reporting she and spouse will mostly utilize take out. We did talk about w/c level snack prep briefly and she would benefit from completing some w/c mobility in the kitchen. Educated pt on technique for donning her leg rests including ELR. Pt unable to don ELR herself, very fatigued after attempting and needing 2 rest breaks. Increased ease with donning regular leg rest on the Rt side. She states at home she will have only regular leg rests for her w/c, unable to reach towards ground to retrieve them during session. Pt then self propelled to dayroom and back to room to work on B UE strengthening. Pt required 1 rest break due to Rt shoulder discomfort. She gave herself a gentle massage and stated this helped, motivated to propel back to room unassisted. Pt was agreeable to remain up in w/c. Left her with all needs within reach and chair alarm activated.   Therapy Documentation Precautions:  Precautions Precautions: Fall Precaution Comments: LLE TDWB Restrictions Weight Bearing Restrictions: Yes LLE Weight Bearing: Touchdown weight bearing Other Position/Activity  Restrictions: TDWB through heel per ortho note Vital Signs: Therapy Vitals Temp: 98.8 F (37.1 C) Temp Source: Oral Pulse Rate: 81 Resp: 18 BP: 110/61 Patient Position (if appropriate): Sitting Oxygen Therapy SpO2: 96 % O2 Device: Room Air ADL: ADL Grooming: Minimal assistance Where Assessed-Grooming: Sitting at sink, Standing at sink Upper Body Bathing: Setup Where Assessed-Upper Body Bathing: Sitting at sink Lower Body Bathing: Minimal assistance Where Assessed-Lower Body Bathing: Standing at sink, Sitting at sink Upper Body Dressing: Supervision/safety Where Assessed-Upper Body Dressing: Sitting at sink Lower Body Dressing: Moderate assistance Where Assessed-Lower Body Dressing: Sitting at sink, Standing at sink Toilet Transfer: Moderate assistance Toilet Transfer Method: Stand pivot Toilet Transfer Equipment: Bedside commode      Therapy/Group: Individual Therapy  Deshannon Hinchliffe A Havier Deeb 04/16/2019, 3:43 PM

## 2019-04-16 NOTE — Progress Notes (Signed)
Informed charge RN Roselyn Reef HD Tx moved to 04/17/19 per Dr. Royce Macadamia.

## 2019-04-17 ENCOUNTER — Inpatient Hospital Stay (HOSPITAL_COMMUNITY): Payer: Medicare Other | Admitting: Occupational Therapy

## 2019-04-17 LAB — CBC
HCT: 30.8 % — ABNORMAL LOW (ref 36.0–46.0)
Hemoglobin: 9.4 g/dL — ABNORMAL LOW (ref 12.0–15.0)
MCH: 31 pg (ref 26.0–34.0)
MCHC: 30.5 g/dL (ref 30.0–36.0)
MCV: 101.7 fL — ABNORMAL HIGH (ref 80.0–100.0)
Platelets: 243 10*3/uL (ref 150–400)
RBC: 3.03 MIL/uL — ABNORMAL LOW (ref 3.87–5.11)
RDW: 17.8 % — ABNORMAL HIGH (ref 11.5–15.5)
WBC: 9.8 10*3/uL (ref 4.0–10.5)
nRBC: 0 % (ref 0.0–0.2)

## 2019-04-17 LAB — RENAL FUNCTION PANEL
Albumin: 2.6 g/dL — ABNORMAL LOW (ref 3.5–5.0)
Anion gap: 14 (ref 5–15)
BUN: 60 mg/dL — ABNORMAL HIGH (ref 8–23)
CO2: 25 mmol/L (ref 22–32)
Calcium: 7.8 mg/dL — ABNORMAL LOW (ref 8.9–10.3)
Chloride: 95 mmol/L — ABNORMAL LOW (ref 98–111)
Creatinine, Ser: 6.98 mg/dL — ABNORMAL HIGH (ref 0.44–1.00)
GFR calc Af Amer: 6 mL/min — ABNORMAL LOW (ref >60)
GFR calc non Af Amer: 5 mL/min — ABNORMAL LOW (ref >60)
Glucose, Bld: 97 mg/dL (ref 70–99)
Phosphorus: 4.2 mg/dL (ref 2.5–4.6)
Potassium: 4.6 mmol/L (ref 3.5–5.1)
Sodium: 134 mmol/L — ABNORMAL LOW (ref 135–145)

## 2019-04-17 LAB — GLUCOSE, CAPILLARY
Glucose-Capillary: 84 mg/dL (ref 70–99)
Glucose-Capillary: 125 mg/dL — ABNORMAL HIGH (ref 70–99)
Glucose-Capillary: 207 mg/dL — ABNORMAL HIGH (ref 70–99)
Glucose-Capillary: 76 mg/dL (ref 70–99)

## 2019-04-17 MED ORDER — INSULIN DETEMIR 100 UNIT/ML ~~LOC~~ SOLN
8.0000 [IU] | Freq: Every day | SUBCUTANEOUS | Status: DC
  Administered 2019-04-18 – 2019-04-20 (×3): 8 [IU] via SUBCUTANEOUS
  Filled 2019-04-17 (×4): qty 0.08

## 2019-04-17 NOTE — Progress Notes (Signed)
Occupational Therapy Session Note  Patient Details  Name: [name] MRN: [id number] Date of Birth: 12-29-1945  Today's Date: 04/17/2019 OT Individual Time: 1300-1346 OT Individual Time Calculation (min): 46 min  14 minutes missed  Short Term Goals: Week 1:  OT Short Term Goal 1 (Week 1): STG = LTGs due to ELOS OT Short Term Goal 1 - Progress (Week 1): Progressing toward goal Week 2:  OT Short Term Goal 1 (Week 2): STG = LTGs due to remaining LOS  Skilled Therapeutic Interventions/Progress Updates:    Pt greeted in bed, just finished lunch and requesting to use the restroom. She was already wearing her orthotic shoes, mindful and aware of her L LE TDWB precaution throughout session. Supervision for supine<sit and steady assist for stand pivot<w/c. Offered to set pt up with walker because she stated feeling very fatigued after HD this AM, however she declined. Steady assist for stand pivot<toilet using grab bar. She was able to lower clothing with steady assist for balance. Pt then voided bowels, attempted hygiene herself in standing, as she stated she was unable to do this while sitting. She ultimately needed A to reach the back thoroughly. Assist also needed for donning new brief. When pt transferred back to the w/c, she completed hand hygiene while sitting at the sink. Openly discussed toileting at d/c. Pt verbalized that she will be d/c home with sister and that sister can assist with hygiene if needed. She also has a bidet component added to her toilet, which she plans to use. Pt still open to idea of toileting devices to increase her functional independence. Therefore OT demonstrated use of toilet tongs and pt had hands on practice setting this up, willing to try using them next time she voided. Pt at this time reported feeling very fatigued and needing to rest. Stand pivot<bed completed with steady assist once again, supervision for transition to supine. Repositioned pt for comfort and pain  relief (neck and Rt shoulder). Also notified RN that she wanted some pain medicine for her back. OT provided lavender scented cotton balls for her pillowcase for aromatherapy interventions to increase relaxation while resting. Left her with all needs within reach and bed alarm set. Time missed due to fatigue.   Therapy Documentation Precautions:  Precautions Precautions: Fall Precaution Comments: LLE TDWB Restrictions Weight Bearing Restrictions: Yes LLE Weight Bearing: Touchdown weight bearing Other Position/Activity Restrictions: TDWB through heel per ortho note Vital Signs: Therapy Vitals Temp: (!) 100.5 F (38.1 C) Pulse Rate: 86 Resp: 16 BP: (!) 105/56 Patient Position (if appropriate): Lying Oxygen Therapy SpO2: 92 % O2 Device: Room Air ADL: ADL Grooming: Minimal assistance Where Assessed-Grooming: Sitting at sink, Standing at sink Upper Body Bathing: Setup Where Assessed-Upper Body Bathing: Sitting at sink Lower Body Bathing: Minimal assistance Where Assessed-Lower Body Bathing: Standing at sink, Sitting at sink Upper Body Dressing: Supervision/safety Where Assessed-Upper Body Dressing: Sitting at sink Lower Body Dressing: Moderate assistance Where Assessed-Lower Body Dressing: Sitting at sink, Standing at sink Toilet Transfer: Moderate assistance Toilet Transfer Method: Stand pivot Toilet Transfer Equipment: Bedside commode      Therapy/Group: Individual Therapy  Eleni Frank A Uziel Covault 04/17/2019, 4:17 PM

## 2019-04-17 NOTE — Progress Notes (Signed)
[facility] Progress Note   Subjective:   Seen on HD - 2L net UFG and tolerating. No CP/dyspnea. C/o mild abd bloating, tolerable. No foot pain.  Objective Vitals:   04/16/19 1937 04/17/19 0608 04/17/19 0720 04/17/19 0726  BP: (!) 104/50 122/60 (!) 125/57 (!) 125/57  Pulse: 77 89 91 90  Resp: 19 16 16    Temp: 99 F (37.2 C) 99.5 F (37.5 C) 99.2 F (37.3 C)   TempSrc:   Oral   SpO2: 94% 94% 95%   Weight:  69.1 kg 71.4 kg   Height:       Physical Exam General: Well appearing woman, NAD Heart: RRR; no murmur Lungs: CTA anteriorly Abdomen: soft, non-tender Extremities: No LE edema; B feet in soft boots Dialysis Access: R AVF + thrill  Additional Objective Labs: Basic Metabolic Panel: Recent Labs  Lab 04/10/19 1736 04/11/19 0627 04/14/19 1430  NA 130* 136 134*  K 3.9 3.9 4.1  CL 95* 97* 96*  CO2 24 26 25   GLUCOSE 101* 128* 162*  BUN 56* 38* 53*  CREATININE 6.05* 5.07* 6.03*  CALCIUM 7.9* 7.7* 7.9*  PHOS 3.9 3.2 3.9   Liver Function Tests: Recent Labs  Lab 04/10/19 1736 04/11/19 0627 04/14/19 1430  ALBUMIN 2.4* 2.2* 2.6*   CBC: Recent Labs  Lab 04/10/19 1736 04/11/19 0627 04/14/19 1430 04/17/19 0732  WBC 9.7 7.3 6.0 9.8  NEUTROABS  --  5.6  --   --   HGB 8.5* 8.0* 9.0* 9.4*  HCT 27.6* 26.2* 29.5* 30.8*  MCV 103.0* 100.4* 103.1* 101.7*  PLT 266 224 225 243   CBG: Recent Labs  Lab 04/16/19 0631 04/16/19 1134 04/16/19 1625 04/16/19 2114 04/17/19 0608  GLUCAP 117* 163* 92 76 84   Iron Studies:  Recent Labs    04/14/19 1430  IRON 39  TIBC 263  FERRITIN 332*   Medications: . ferric gluconate (FERRLECIT/NULECIT) IV Stopped (04/14/19 1617)   . allopurinol  100 mg Oral BID  . aspirin EC  81 mg Oral Daily  . atorvastatin  10 mg Oral q1800  . Chlorhexidine Gluconate Cloth  6 each Topical Q0600  . Chlorhexidine Gluconate Cloth  6 each Topical Q0600  . cinacalcet  30 mg Oral Q supper  . darbepoetin (ARANESP) injection -  DIALYSIS  100 mcg Intravenous Q Tue-HD  . doxercalciferol  4 mcg Intravenous Q T,Th,Sa-HD  . feeding supplement (NEPRO CARB STEADY)  237 mL Oral BID BM  . feeding supplement (PRO-STAT SUGAR FREE 64)  30 mL Oral BID  . gabapentin  100 mg Oral BID  . hydrALAZINE  25 mg Oral Q8H  . hydrocortisone   Rectal BID  . insulin aspart  0-15 Units Subcutaneous TID WC  . insulin detemir  10 Units Subcutaneous Daily  . isosorbide dinitrate  10 mg Oral TID  . lidocaine  1 patch Transdermal Q24H  . multivitamin  1 tablet Oral QHS  . pantoprazole  40 mg Oral Daily  . sevelamer carbonate  1,600 mg Oral TID WC  . traZODone  25-100 mg Oral QHS    Dialysis Orders: [facility] 350/800 3K/2.25 bath 72kg Heparin 5000 Units IV TIW - vancomycin 750 mg q. HD (now D/c'd) - Mircera 50 mcg every 2 wks - Hectorol 4 mcg IV tiw   Assessment/Plan: 1. 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 2. ESRD: Continue HD per TTS  sched - HD today (rollover from Sat). 3. HTN/volume: BP stable, below prior EDW. Follow. 4. Anemia: Hgb 9.4 - continue Aranesp 165mcg IV weekly. Tsat 15% on 1/7 - getting course of IV iron. 5.Metabolic Bone Disease- Corr Ca/Phos at goal. Continue Renvela binder/VDRA.  6. Nutrition - Continue protein supplement for low albumin  Veneta Penton, PA-C 04/17/2019, 8:00 AM  [facility] Pager: [phone]

## 2019-04-17 NOTE — Progress Notes (Addendum)
[place] PHYSICAL MEDICINE & REHABILITATION PROGRESS NOTE  Subjective/Complaints:  No issues overnite except for "sciatic pain"  Has this bilaterally, hx severe diabetic neuropathy  ROS: Denies CP, SOB, N/V/D  Objective: Vital Signs: Blood pressure (!) 155/72, pulse 87, temperature 99 F (37.2 C), temperature source Oral, resp. rate 18, height 5' (1.524 m), weight 69.2 kg, SpO2 96 %. No results found. Recent Labs    04/14/19 1430 04/17/19 0732  WBC 6.0 9.8  HGB 9.0* 9.4*  HCT 29.5* 30.8*  PLT 225 243   Recent Labs    04/14/19 1430 04/17/19 0732  NA 134* 134*  K 4.1 4.6  CL 96* 95*  CO2 25 25  GLUCOSE 162* 97  BUN 53* 60*  CREATININE 6.03* 6.98*  CALCIUM 7.9* 7.8*    Physical Exam: BP (!) 155/72 (BP Location: Left Arm)   Pulse 87   Temp 99 F (37.2 C) (Oral)   Resp 18   Ht 5' (1.524 m)   Wt 69.2 kg   SpO2 96%   BMI 29.79 kg/m  Constitutional: No distress . Vital signs reviewed.  Obese. HENT: Normocephalic.  Atraumatic. Eyes: EOMI. No discharge. Cardiovascular: No JVD. Respiratory: Normal effort.  No stridor. GI: Non-distended. Skin: Left lower extremity incision with sutures C/D/I Psych: Normal mood.  Normal behavior. Musc: Left lateral foot edema and tenderness Neuro: Alert Motor: B/l UE, RLE: 5/5 proximal to distal, unchanged LLE: HF, KE 5/5, ADF 4/5, some pain inhibition)  Assessment/Plan: 1. Functional deficits secondary to debility which require 3+ hours per day of interdisciplinary therapy in a comprehensive inpatient rehab setting.  Physiatrist is providing close team supervision and 24 hour management of active medical problems listed below.  Physiatrist and rehab team continue to assess barriers to discharge/monitor patient progress toward functional and medical goals  Care Tool:  Bathing    Body parts bathed by patient: Right arm, Left arm, Chest, Abdomen, Front perineal area, Face   Body parts bathed by helper: Buttocks      Bathing assist Assist Level: Minimal Assistance - Patient > 75%     Upper Body Dressing/Undressing Upper body dressing Upper body dressing/undressing activity did not occur (including orthotics): N/A What is the patient wearing?: Pull over shirt    Upper body assist Assist Level: Set up assist    Lower Body Dressing/Undressing Lower body dressing    Lower body dressing activity did not occur: N/A What is the patient wearing?: Pants     Lower body assist Assist for lower body dressing: Contact Guard/Touching assist     Toileting Toileting Toileting Activity did not occur (Clothing management and hygiene only): Refused  Toileting assist Assist for toileting: Contact Guard/Touching assist     Transfers Chair/bed transfer  Transfers assist  Chair/bed transfer activity did not occur: Safety/medical concerns  Chair/bed transfer assist level: Contact Guard/Touching assist Chair/bed transfer assistive device: Walker, Clinical biochemist   Ambulation assist      Assist level: Minimal Assistance - Patient > 75% Assistive device: Walker-rolling Max distance: 20ft   Walk 10 feet activity   Assist  Walk 10 feet activity did not occur: Safety/medical concerns(LLE TDWB status, LE weakness, fatigue)        Walk 50 feet activity   Assist Walk 50 feet with 2 turns activity did not occur: Safety/medical concerns(LLE TDWB status, LE weakness, fatigue)         Walk 150 feet activity   Assist Walk 150 feet activity did not occur: Safety/medical  concerns(LLE TDWB status, LE weakness, fatigue)         Walk 10 feet on uneven surface  activity   Assist Walk 10 feet on uneven surfaces activity did not occur: Safety/medical concerns(LLE TDWB status, LE weakness, fatigue)         Wheelchair     Assist Will patient use wheelchair at discharge?: Yes Type of Wheelchair: Manual    Wheelchair assist level: Supervision/Verbal cueing Max  wheelchair distance: 125ft    Wheelchair 50 feet with 2 turns activity    Assist    Wheelchair 50 feet with 2 turns activity did not occur: (fatigue, increased R shoulder pain)   Assist Level: Supervision/Verbal cueing   Wheelchair 150 feet activity     Assist Wheelchair 150 feet activity did not occur: (fatigue, increased R shoulder pain)   Assist Level: Maximal Assistance - Patient 25 - 49%      Medical Problem List and Plan: 1.  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  Touchdown weightbearing left lower extremity, just for balance  Continue CIR PT, OT  2.  Antithrombotics: -DVT/anticoagulation: ?None needed after recent peripheral vascular surgery             -antiplatelet therapy: Aspirin 81 mg daily 3. Pain Management: Flexeril and hydrocodone as needed  Controlled on 1/8 with medications  Complains of neuropathy pain an dsciatic , sciatic pain improved wit gabapentin , slept better 4. Mood: Trazodone nightly.  Provide emotional support             -antipsychotic agents: N/A 5. Neuropsych: This patient is capable of making decisions on her own behalf. 6. Skin/Wound Care: Routine skin checks for incisions from fempop bypass and L 5th ray amputation  Continue dressing changes with gauze, kerlix, and ace wrap  Monitor for wound healing 7. Fluids/Electrolytes/Nutrition: Routine in and outs 8.  ESRD.  Continue hemodialysis as directed- T/H/S 9. Hypertension.  Hydralazine 25 mg every 8 hours, Isordil 10 mg 3 times daily.  Vitals:   04/17/19 0930 04/17/19 0956  BP: (!) 162/72 (!) 155/72  Pulse: 88 87  Resp:  18  Temp:  99 F (37.2 C)  SpO2:  96%     Relatively controlled on 1/10-  Will check orthostatic vitals , if no sig drop  may consider increasing isordil to 20mg  TID   Monitor with increased mobility 10.  Hyperlipidemia.  Lipitor 11.  History of gout.  Continue Zyloprim twice daily.  Monitor for gout flareups. 12.  Diabetes mellitus type II with hyperglycemia.  Levemir 10 units daily.  Check blood sugars before meals and at bedtime   CBG (last 3)  Recent Labs    04/16/19 1625 04/16/19 2114 04/17/19 0608  GLUCAP 92 76 84   Running low will reduce levimir to 8U   Monitor with increased mobility 13. Osteomyelitis- Completed course of IV ABX  14.  Acute on chronic anemia- restart Iron per Nephro  Hemoglobin 9.3 on 1/7  Continue to monitor 15. Drug induced constipation  KUB personally reviewed, suggesting constipation  Bowel meds increased on 1/1, adjusted further 1/4, d/ced on 1/6  Will consider Fiber  Hemorrhoid cream for hemorrhoid Sx's  Cont stool yesterday  16. Leukocytosis: Resolved  WBC 7.3 on 1/4 17. Sciatic/piriformis syndrome- gabapentin started 100mg  BID - improved  sciatic pain   -tennis ball- will get family to bring in for her.  Lidocaine patch ordered  See #3  LOS: 10 days A FACE TO [place] E Delquan Poucher 04/17/2019, 11:22 AM

## 2019-04-18 ENCOUNTER — Inpatient Hospital Stay (HOSPITAL_COMMUNITY): Payer: Medicare Other

## 2019-04-18 ENCOUNTER — Inpatient Hospital Stay (HOSPITAL_COMMUNITY): Payer: Medicare Other | Admitting: Occupational Therapy

## 2019-04-18 DIAGNOSIS — M792 Neuralgia and neuritis, unspecified: Secondary | ICD-10-CM

## 2019-04-18 DIAGNOSIS — R7309 Other abnormal glucose: Secondary | ICD-10-CM

## 2019-04-18 LAB — RENAL FUNCTION PANEL
Albumin: 2.5 g/dL — ABNORMAL LOW (ref 3.5–5.0)
Anion gap: 12 (ref 5–15)
BUN: 41 mg/dL — ABNORMAL HIGH (ref 8–23)
CO2: 29 mmol/L (ref 22–32)
Calcium: 7.8 mg/dL — ABNORMAL LOW (ref 8.9–10.3)
Chloride: 95 mmol/L — ABNORMAL LOW (ref 98–111)
Creatinine, Ser: 5.41 mg/dL — ABNORMAL HIGH (ref 0.44–1.00)
GFR calc Af Amer: 8 mL/min — ABNORMAL LOW (ref >60)
GFR calc non Af Amer: 7 mL/min — ABNORMAL LOW (ref >60)
Glucose, Bld: 109 mg/dL — ABNORMAL HIGH (ref 70–99)
Phosphorus: 3.8 mg/dL (ref 2.5–4.6)
Potassium: 3.8 mmol/L (ref 3.5–5.1)
Sodium: 136 mmol/L (ref 135–145)

## 2019-04-18 LAB — CBC WITH DIFFERENTIAL/PLATELET
Abs Immature Granulocytes: 0.03 10*3/uL (ref 0.00–0.07)
Basophils Absolute: 0 10*3/uL (ref 0.0–0.1)
Basophils Relative: 0 %
Eosinophils Absolute: 0.2 10*3/uL (ref 0.0–0.5)
Eosinophils Relative: 3 %
HCT: 29 % — ABNORMAL LOW (ref 36.0–46.0)
Hemoglobin: 9 g/dL — ABNORMAL LOW (ref 12.0–15.0)
Immature Granulocytes: 1 %
Lymphocytes Relative: 19 %
Lymphs Abs: 1.2 10*3/uL (ref 0.7–4.0)
MCH: 31 pg (ref 26.0–34.0)
MCHC: 31 g/dL (ref 30.0–36.0)
MCV: 100 fL (ref 80.0–100.0)
Monocytes Absolute: 0.8 10*3/uL (ref 0.1–1.0)
Monocytes Relative: 12 %
Neutro Abs: 4.2 10*3/uL (ref 1.7–7.7)
Neutrophils Relative %: 65 %
Platelets: 218 10*3/uL (ref 150–400)
RBC: 2.9 MIL/uL — ABNORMAL LOW (ref 3.87–5.11)
RDW: 17.5 % — ABNORMAL HIGH (ref 11.5–15.5)
WBC: 6.4 10*3/uL (ref 4.0–10.5)
nRBC: 0.3 % — ABNORMAL HIGH (ref 0.0–0.2)

## 2019-04-18 LAB — GLUCOSE, CAPILLARY
Glucose-Capillary: 104 mg/dL — ABNORMAL HIGH (ref 70–99)
Glucose-Capillary: 123 mg/dL — ABNORMAL HIGH (ref 70–99)
Glucose-Capillary: 136 mg/dL — ABNORMAL HIGH (ref 70–99)
Glucose-Capillary: 177 mg/dL — ABNORMAL HIGH (ref 70–99)

## 2019-04-18 NOTE — Progress Notes (Signed)
Physical Therapy Session Note  Patient Details  Name: [name] MRN: [id number] Date of Birth: February 28, 1946  Today's Date: 04/18/2019 PT Individual Time: 2637-8588 PT Individual Time Calculation (min): 60 min   Short Term Goals: Week 2:  PT Short Term Goal 1 (Week 2): STG=LTG due to LOS  Skilled Therapeutic Interventions/Progress Updates:    w/c propulsion for functional mobility training and UE strengthening/endurance x 120' with supervision with cues for efficient technique - also issued w/c gloves for better grip and hand protection. Focused on setting up w/c for transfer including parts management with extra time and cues for technique. Performed min assist pivot transfer to mat table <> w/c with difficulty with coordination and foot placement while maintaining WB status. Instructed in therex for functional strengthening to aid with overall mobility and transfers including seated LAQ (2# ankle weight on RLE) with 5 sec hold x 10 reps BLE, seated marches x10 reps BLE, supine hooklying hip adduction squeezes x 5 sec hold x 10 reps, supine mini crunches x 2 sets x 10 reps, seated trunk rotation with 3# straight weight, bicep curls x  Reps with 3#, and forward chest press with scapular retraction x 10 reps with 3# weight. Blocked practice sit <> stands with CGA/min assist overall with cues for weightbearing and technique and pre-gait x 2 steps forwards and backwards to better improve stand pivot technique. End of session placed ice on R shoulder due to h/o bursitis and reports some discomfort after exercises.   Therapy Documentation Precautions:  Precautions Precautions: Fall Precaution Comments: LLE TDWB Restrictions Weight Bearing Restrictions: Yes LLE Weight Bearing: Touchdown weight bearing Other Position/Activity Restrictions: TDWB through heel per ortho note  Pain: Denies pain except at end of session reports some R shoulder soreness. Ice applied.     Therapy/Group: Individual  Therapy  Canary Brim Ivory Broad, PT, DPT, CBIS  04/18/2019, 11:48 AM

## 2019-04-18 NOTE — Plan of Care (Signed)
  Problem: Consults Goal: RH LIMB LOSS PATIENT EDUCATION Description: Description: See Patient Education module for eduction specifics. Outcome: Progressing Goal: Skin Care Protocol Initiated - if Braden Score 18 or less Description: If consults are not indicated, leave blank or document N/A Outcome: Progressing Goal: Nutrition Consult-if indicated Outcome: Progressing Goal: Diabetes Guidelines if Diabetic/Glucose > 140 Description: If diabetic or lab glucose is > 140 mg/dl - Initiate Diabetes/Hyperglycemia Guidelines & Document Interventions  Outcome: Progressing   Problem: RH BOWEL ELIMINATION Goal: RH STG MANAGE BOWEL WITH ASSISTANCE Description: STG Manage Bowel with min Assistance. Outcome: Progressing Goal: RH STG MANAGE BOWEL W/MEDICATION W/ASSISTANCE Description: STG Manage Bowel with Medication with min Assistance. Outcome: Progressing   Problem: RH SKIN INTEGRITY Goal: RH STG SKIN FREE OF INFECTION/BREAKDOWN Description: Patients skin will remain free from further infection or breakdown with min/mod assist. Outcome: Progressing Goal: RH STG MAINTAIN SKIN INTEGRITY WITH ASSISTANCE Description: STG Maintain Skin Integrity With min/mod Assistance. Outcome: Progressing Goal: RH STG ABLE TO PERFORM INCISION/WOUND CARE W/ASSISTANCE Description: STG Able To Perform Incision/Wound Care With total Assistance from caregiver. Outcome: Progressing   Problem: RH SAFETY Goal: RH STG ADHERE TO SAFETY PRECAUTIONS W/ASSISTANCE/DEVICE Description: STG Adhere to Safety Precautions With mod I Assistance/Device. Outcome: Progressing   Problem: RH PAIN MANAGEMENT Goal: RH STG PAIN MANAGED AT OR BELOW PT'S PAIN GOAL Description: < 4 Outcome: Progressing

## 2019-04-18 NOTE — Progress Notes (Signed)
Occupational Therapy Session Note  Patient Details  Name: [name] MRN: [id number] Date of Birth: 1945/08/20  Today's Date: 04/18/2019 OT Individual Time: 0092-3300 OT Individual Time Calculation (min): 75 min    Short Term Goals: Week 2:  OT Short Term Goal 1 (Week 2): STG = LTGs due to remaining LOS  Skilled Therapeutic Interventions/Progress Updates:    Treatment session with focus on functional transfers and w/c mobility in home environment.  Pt received supine in bed requiring increased time to fully arouse to participate in treatment session.  Pt completed bed mobility with supervision and completed stand pivot with min assist due to LOB mid transfer.  Engaged in grooming tasks in sitting at w/c level with increased time.  Pt propelled w/c 150' with supervision for BUE strengthening.  Educated on home making tasks in ADL apt with focus on w/c mobility and positioning.  Pt able to obtain items from refrigerator with min cues for w/c setup and reminder to lock brakes before completing any reaching.  Discussed rearranging items as needed to make items more accessible from w/c level with pt in agreement.  Returned to room as pt reports need to toilet.  Completed stand pivot transfer to [facility] over toilet with supervision.  Pt able to complete clothing management pre and post toileting.  Utilized toilet aid with increased time and min cues for technique.  Returned to w/c and left upright with all needs in reach.  Therapy Documentation Precautions:  Precautions Precautions: Fall Precaution Comments: LLE TDWB Restrictions Weight Bearing Restrictions: Yes LLE Weight Bearing: Touchdown weight bearing Other Position/Activity Restrictions: TDWB through heel per ortho note Pain: Pain Assessment Pain Scale: 0-10 Pain Score: 0-No pain   Therapy/Group: Individual Therapy  Simonne Come 04/18/2019, 12:10 PM

## 2019-04-18 NOTE — Progress Notes (Addendum)
[name] Progress Note   Subjective: No new complaints. For DC Wednesday.   Objective Vitals:   04/17/19 1420 04/17/19 1638 04/17/19 1952 04/18/19 0629  BP: (!) 105/56  (!) 118/48 (!) 128/58  Pulse: 86  78 78  Resp: 16  16 16   Temp: (!) 100.5 F (38.1 C) 99.6 F (37.6 C) 99.2 F (37.3 C) 98.9 F (37.2 C)  TempSrc:  Oral Oral Oral  SpO2: 92%  95% 95%  Weight:    66.9 kg  Height:       Physical Exam General: Pleasant older female in NAD Heart: Y8,M5 1/6 systolic M. No R/G Lungs: CTAB Abdomen: S, NT, ND.Active BS.  Extremities: BLE wrapped but no edema visible in limited exam Dialysis Access: R AVF +T/B.   Additional Objective Labs: Basic Metabolic Panel: Recent Labs  Lab 04/14/19 1430 04/17/19 0732 04/18/19 0631  NA 134* 134* 136  K 4.1 4.6 3.8  CL 96* 95* 95*  CO2 25 25 29   GLUCOSE 162* 97 109*  BUN 53* 60* 41*  CREATININE 6.03* 6.98* 5.41*  CALCIUM 7.9* 7.8* 7.8*  PHOS 3.9 4.2 3.8   Liver Function Tests: Recent Labs  Lab 04/14/19 1430 04/17/19 0732 04/18/19 0631  ALBUMIN 2.6* 2.6* 2.5*   No results for input(s): LIPASE, AMYLASE in the last 168 hours. CBC: Recent Labs  Lab 04/14/19 1430 04/17/19 0732 04/18/19 0631  WBC 6.0 9.8 6.4  NEUTROABS  --   --  4.2  HGB 9.0* 9.4* 9.0*  HCT 29.5* 30.8* 29.0*  MCV 103.1* 101.7* 100.0  PLT 225 243 218   Blood Culture    Component Value Date/Time   SDES BLOOD BLOOD LEFT FOREARM 03/23/2019 1538   SPECREQUEST  03/23/2019 1538    BOTTLES DRAWN AEROBIC AND ANAEROBIC Blood Culture results may not be optimal due to an excessive volume of blood received in culture bottles   CULT  03/23/2019 1538    NO GROWTH 5 DAYS Performed at [facility] [address] [place], [place] [address]    REPTSTATUS 03/28/2019 FINAL 03/23/2019 1538    Cardiac Enzymes: No results for input(s): CKTOTAL, CKMB, CKMBINDEX, TROPONINI in the last 168 hours. CBG: Recent Labs  Lab 04/17/19 1211  04/17/19 1636 04/17/19 2107 04/18/19 0624 04/18/19 1231  GLUCAP 125* 207* 76 104* 123*   Iron Studies: No results for input(s): IRON, TIBC, TRANSFERRIN, FERRITIN in the last 72 hours. @lablastinr3 @ Studies/Results: No results found. Medications: . ferric gluconate (FERRLECIT/NULECIT) IV Stopped (04/14/19 1617)   . allopurinol  100 mg Oral BID  . aspirin EC  81 mg Oral Daily  . atorvastatin  10 mg Oral q1800  . Chlorhexidine Gluconate Cloth  6 each Topical Q0600  . Chlorhexidine Gluconate Cloth  6 each Topical Q0600  . cinacalcet  30 mg Oral Q supper  . darbepoetin (ARANESP) injection - DIALYSIS  100 mcg Intravenous Q Tue-HD  . doxercalciferol  4 mcg Intravenous Q T,Th,Sa-HD  . feeding supplement (NEPRO CARB STEADY)  237 mL Oral BID BM  . feeding supplement (PRO-STAT SUGAR FREE 64)  30 mL Oral BID  . gabapentin  100 mg Oral BID  . hydrALAZINE  25 mg Oral Q8H  . hydrocortisone   Rectal BID  . insulin aspart  0-15 Units Subcutaneous TID WC  . insulin detemir  8 Units Subcutaneous Daily  . isosorbide dinitrate  10 mg Oral TID  . lidocaine  1 patch Transdermal Q24H  . multivitamin  1 tablet Oral  QHS  . pantoprazole  40 mg Oral Daily  . sevelamer carbonate  1,600 mg Oral TID WC  . traZODone  25-100 mg Oral QHS     Dialysis Orders: [facility] 350/800 3K/2.25 bath 72kg Heparin5000 Units IV TIW - vancomycin 750 mg q. HD(now D/c'd) - Mircera 50 mcg every 2 wks - Hectorol 4 mcg IV tiw   Assessment/Plan: 1. 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 2. ESRD: Continue HD per TTS schedule. Truncated treatments D/T high census. Use modified heparin dose.  3. HTN/volume: BP stable, below prior EDW. Follow. 4. Anemia: Hgb 9.0 - continue Aranesp 113mcg IV weekly. Tsat 15% on 1/7 - getting course of IV iron. 5.Metabolic Bone Disease- Corr Ca/Phosat  goal. Continue Renvela binder/VDRA.  6. Nutrition - Continue protein supplement for low albumin  Disposition: Planning for DC 04/19/18 with [place].   Lakevia Perris H. Nayelly Laughman NP-C 04/18/2019, 1:41 PM  [employer] [phone]

## 2019-04-18 NOTE — Discharge Summary (Addendum)
Physician Discharge Summary  Patient ID: ELEXA KIVI MRN: [id number] DOB/AGE: 07-09-1945 74 y.o.  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  Discharged Condition: Stable  Significant Diagnostic Studies: DG Chest 2 View  Result Date: 03/23/2019 CLINICAL DATA:  Left foot ulcer. Sepsis. EXAM: CHEST - 2 VIEW COMPARISON:  Aug 25, 2018. FINDINGS: The heart size and mediastinal contours are within normal limits. No pneumothorax or significant pleural effusion is noted. Status post coronary bypass graft. No acute pulmonary disease is noted. Atherosclerosis of thoracic aorta is noted. The visualized skeletal structures are unremarkable. IMPRESSION: No active cardiopulmonary disease. Aortic atherosclerosis. Electronically Signed   By: Marijo Conception M.D.   On: 03/23/2019 14:07   DG Abd 1 View  Result Date: 04/08/2019 CLINICAL DATA:  Constipation EXAM: ABDOMEN - 1 VIEW COMPARISON:  Chest x-ray 03/23/2019 FINDINGS: Nonobstructed gas pattern with large volume of stool throughout the colon. Extensive vascular calcification. No radiopaque calculi. Punctate calcifications in the pelvis likely reflecting phleboliths. IMPRESSION: Nonobstructed gas pattern with large  volume of feces throughout the colon and rectum Electronically Signed   By: Donavan Foil M.D.   On: 04/08/2019 20:04   MR FOOT LEFT WO CONTRAST  Result Date: 03/23/2019 CLINICAL DATA:  Nonhealing ulcer along the lateral aspect of the foot. EXAM: MRI OF THE LEFT FOOT WITHOUT CONTRAST TECHNIQUE: Multiplanar, multisequence MR imaging of the 03/10/2019 was performed. No intravenous contrast was administered. COMPARISON:  Radiographs 03/10/2019 FINDINGS: Diffuse abnormal T1 and T2 signal intensity in the mid distal aspect of the fifth metatarsal and also in the proximal phalanx. There is joint space narrowing at the fifth MTP joint, small joint effusion and surrounding inflammatory phlegmon and abscess. Findings consistent with septic arthritis at the fifth MTP joint and osteomyelitis involving the fifth metatarsal and fifth proximal phalanx. Remote partial second ray amputation. No other areas of osteomyelitis are identified. Moderate to advanced midfoot degenerative changes but no erosive findings or destructive bony changes. Diffuse subcutaneous soft tissue swelling/edema/fluid suggesting cellulitis. There is also mild diffuse myositis. IMPRESSION: 1. Septic arthritis at the fifth MTP joint with osteomyelitis involving the distal half of the fifth metatarsal and also the proximal phalanx. 2. Surrounding inflammatory phlegmon and abscess. 3. Diffuse cellulitis and myofasciitis. Electronically Signed   By: Marijo Sanes M.D.   On: 03/23/2019 12:07   PERIPHERAL VASCULAR CATHETERIZATION  Result Date: 03/25/2019 Patient name: JAIDEN WAHAB         MRN: [id number]        DOB: 29-Jan-1946          Sex: female   03/25/2019 Pre-operative Diagnosis: Critical limb ischemia of the bilateral lower extremities with bilateral lower extremity tissue loss.  Current hospital admission prompted by left fifth metatarsal osteomyelitis and open  left foot wound. Post-operative diagnosis:  Same Surgeon:  Marty Heck, MD  Procedure Performed: 1.  Ultrasound-guided access of the right common femoral artery 2.  Aortogram 3.  Bilateral lower extremity arteriogram with runoff 4.  32 minutes of monitored moderate conscious sedation time   Indications: Patient is a 74 year old female with multiple medical comorbidities well-known to the vascular surgery service that was admitted with open wound and left fifth metatarsal osteomyelitis after failure of outpatient antibiotics.  Ultimately she has known bilateral SFA occlusions with monophasic runoff at the ankle from vascular studies earlier this year.  She was previously evaluated for right lower extremity wound by Dr. Donnetta Hutching that was healing at the time of initial evaluation in April of this year.  Her right foot wound persists at this time.  She now presents for aortogram, bilateral lower extremity arteriogram, possible intervention after risk and benefits were discussed.   Findings:   Aortogram showed calcified but patent aortoiliac segment bilaterally with no overt flow-limiting stenosis.   Left lower extremity arteriogram shows at least 70% stenosis in the left common femoral artery with heavily calcified plaque.  Patient has a flush SFA occlusion on the left with profunda runoff.  Her SFA is occluded throughout its proximal mid and distal segment.  She does reconstitute a heavily diseased above-knee popliteal artery.  The below-knee popliteal artery looks much healthier although heavily calcified.  Patient's dominant runoff is via the anterior tibial but does appear to have at least three-vessel runoff.  There is significant small vessel disease in the left foot.   Right lower extremity arteriogram shows again at least 50% common femoral artery stenosis with dominant runoff via the profunda.  There is a very tiny SFA stump with large calcified plaque but then it occludes shortly after takeoff.  Again the SFA is occluded throughout its entire segment.  She does reconstitute heavily  diseased below-knee popliteal artery with near occlusive plaque and single-vessel runoff via an anterior tibial artery.             Procedure:  The patient was identified in the holding area and taken to room 8.  The patient was then placed supine on the table and prepped and draped in the usual sterile fashion.  A time out was called.  Ultrasound was used to evaluate the right common femoral artery.  It was patent .  A digital ultrasound image was acquired.  A micropuncture needle was used to access the right common femoral artery under ultrasound guidance.  An 018 wire was advanced without resistance and a micropuncture sheath was placed.  The 018 wire was removed and a benson wire was placed.  The micropuncture sheath was exchanged for a 5 french sheath.  An omniflush catheter was advanced over the wire to the level of L-1.  An abdominal angiogram was obtained.  Next, the Omni Flush catheter was pulled down in the aorta and bilateral lower extremity runoff was obtained.  Pertinent findings are noted above.  Given the patient had a sheath in the right common femoral artery that was fairly diseased we had fairly limited evaluation of the right lower extremity.  As a result we then removed the Omni Flush catheter over a wire.  We then connected our contrast tubing to the sheath in the right common femoral artery and retrograde runoff was obtained of the right lower extremity in staged images to get better views.  All pertinent findings are noted above.  Once imaging was complete,  the sheath in the right common femoral artery was then pulled and manual pressure was held for 20 minutes.  She tolerated the procedure without any apparent complications.   Plan: Patient will be scheduled for left common femoral endarterectomy with left femoral to below-knee popliteal bypass on Monday with Dr. Donnetta Hutching.  Vein mapping has been ordered.   Marty Heck, MD [facility] Office: [phone]  Pager: [phone]   Marty Heck  VAS [name] ABI WITH/WO TBI  Result Date: 03/30/2019 LOWER EXTREMITY DOPPLER STUDY Indications: Left femoral popliteal bypass. High Risk Factors: Hyperlipidemia, Diabetes.  Comparison Study: 07/19/18 unreliable ABIs, no pressures documented Performing Technologist: June Leap Rdms, Rvt  Examination Guidelines: A complete evaluation includes at minimum, Doppler waveform signals and systolic blood pressure reading at the level of bilateral brachial, anterior tibial, and posterior tibial arteries, when vessel segments are accessible. Bilateral testing is considered an integral part of a complete examination. Photoelectric Plethysmograph (PPG) waveforms and toe systolic pressure readings are included as required and additional duplex testing as needed. Limited examinations for reoccurring indications may be performed as noted.  ABI Findings: +--------+------------------+-----+----------+----------+ Right   Rt Pressure (mmHg)IndexWaveform  Comment    +--------+------------------+-----+----------+----------+ Brachial                       triphasic AVF in arm +--------+------------------+-----+----------+----------+ ATA     152               1.18 monophasic           +--------+------------------+-----+----------+----------+ PTA     255               1.98 monophasic           +--------+------------------+-----+----------+----------+ +--------+------------------+-----+----------+-------+ Left    Lt Pressure (mmHg)IndexWaveform  Comment +--------+------------------+-----+----------+-------+ [id number]                    triphasic         +--------+------------------+-----+----------+-------+ ATA     95                0.74 monophasic        +--------+------------------+-----+----------+-------+ PTA     255               1.98 monophasic        +--------+------------------+-----+----------+-------+  Summary: Right: Resting right  ankle-brachial index indicates noncompressible right lower extremity arteries. Left: Resting left ankle-brachial index indicates noncompressible left lower extremity arteries.  *See table(s) above for measurements and observations.  Electronically signed by Curt Jews MD on 03/30/2019 at 4:04:24 PM.   Final    VAS [name] LOWER EXTREMITY SAPHENOUS VEIN MAPPING  Result Date: 03/26/2019 LOWER EXTREMITY VEIN MAPPING Indications:        Pre-op Other Indications:  PAD Other Risk Factors: Hx Rt GSV harvest for surgery.  Performing Technologist: June Leap RDMS, RVT  Examination Guidelines: A complete evaluation includes B-mode imaging, spectral Doppler, color Doppler, and power Doppler as needed of all accessible portions of each vessel. Bilateral testing is considered an integral part of a complete examination. Limited examinations for reoccurring indications may be performed as noted. +--------------+---------------+--------------------+--------------+-----------+  RT Diameter    RT Findings          GSV          LT Diameter  LT Findings      (cm)                                             (  cm)                 +--------------+---------------+--------------------+--------------+-----------+                                 Saphenofemoral        0.75                                                    Junction                                +--------------+---------------+--------------------+--------------+-----------+               branching /0.15   Proximal thigh        0.38      branching                   cm branch                                                 +--------------+---------------+--------------------+--------------+-----------+                  branching        Mid thigh           0.30      branching  +--------------+---------------+--------------------+--------------+-----------+                                  Distal thigh         0.27                  +--------------+---------------+--------------------+--------------+-----------+      0.16                            Knee             0.35                 +--------------+---------------+--------------------+--------------+-----------+      0.22                         Prox calf           0.32      branching  +--------------+---------------+--------------------+--------------+-----------+      0.22        branching         Mid calf           0.23      branching  +--------------+---------------+--------------------+--------------+-----------+      0.27                        Distal calf          0.22                 +--------------+---------------+--------------------+--------------+-----------+ Diagnosing physician: Harold Barban MD Electronically signed by Harold Barban MD on 03/26/2019 at 4:39:30 PM.    Final     Labs:  Basic Metabolic Panel: Recent  Labs  Lab 04/14/19 1430 04/17/19 0732 04/18/19 0631 04/19/19 1430  NA 134* 134* 136 133*  K 4.1 4.6 3.8 3.9  CL 96* 95* 95* 94*  CO2 25 25 29 24   GLUCOSE 162* 97 109* 108*  BUN 53* 60* 41* 64*  CREATININE 6.03* 6.98* 5.41* 6.85*  CALCIUM 7.9* 7.8* 7.8* 7.8*  PHOS 3.9 4.2 3.8 4.3    CBC: Recent Labs  Lab 04/17/19 0732 04/18/19 0631 04/19/19 1430  WBC 9.8 6.4 7.0  NEUTROABS  --  4.2  --   HGB 9.4* 9.0* 9.1*  HCT 30.8* 29.0* 29.6*  MCV 101.7* 100.0 100.3*  PLT 243 218 234    CBG: Recent Labs  Lab 04/18/19 2116 04/19/19 0556 04/19/19 1203 04/19/19 1750 04/19/19 2133  GLUCAP 177* 87 112* 114* 104*   Family history.  Brother with kidney cancer as well as hypertension.  Sister with breast cancer.  Father with throat cancer.  Denies any diabetes mellitus colon or rectal cancer  Brief HPI:   [name] is a 74 y.o. right-handed female with history of CAD with CABG, chronic combined congestive heart failure with ICM, end-stage renal disease with hemodialysis, hypertension, peripheral vascular disease  as well as remote history of tobacco abuse.  Per chart review lives with spouse independent with assistive device.  Two-level home bed and bath on main level.  Presented 03/23/2019 with left gangrenous open wound of foot had been followed with outpatient antibiotics.  Ultimately patient known bilateral SFA occlusions monophasic runoff of the ankle for vascular studies earlier in the year.  Underwent left common femoral enterectomy and Dacron patch angioplasty as well as left femoral to below-knee popliteal bypass 03/28/2019 per Dr. Donnetta Hutching.  Hospital course pain management.  MRI of left foot showed septic arthritis of the fifth MTP joint with osteomyelitis involving the distal half of the fifth metatarsal and also the proximal phalanx.  Orthopedic service is consulted in regards to findings of MRI of left foot Dr. Sharol Given and underwent left fifth ray amputation 04/06/2019.  Renal service follow-up continued hemodialysis as directed.  Patient was admitted for a comprehensive rehab program.   Hospital Course: RABAB CURRINGTON was admitted to rehab 04/07/2019 for inpatient therapies to consist of PT, ST and OT at least three hours five days a week. Past admission physiatrist, therapy team and rehab RN have worked together to provide customized collaborative inpatient rehab.  In regards to patient's peripheral vascular disease status post left common femoral enterectomy enterectomy and femoral to below-knee popliteal bypass 03/28/2019 with left fifth ray amputation 04/06/2019.  Dressing changes with gauze Curlex and Ace wrap to left foot.  Surgical sites healing nicely touchdown weightbearing left lower extremity just for balance.  Patient follow-up vascular surgery as well the orthopedic services.  Pain managed with use of Flexeril as well as hydrocodone and scheduled Lidoderm patch.  Patient was scheduled Neurontin 100 mg twice daily.  Maintained on low-dose aspirin for peripheral vascular disease as well as history of  CAD with CABG.  Hemodialysis ongoing as per renal services.  Blood pressure control with hydralazine as well as scheduled Isordil.  Patient with follow-up primary MD.  Lipitor ongoing for hyperlipidemia.  Patient noted history of gout no flareups noted Zyloprim twice daily.  Diabetes mellitus as well as peripheral neuropathy Levemir 8 units full diabetic teaching.  Blood sugars 76-104-123.  Acute on chronic anemia hemoglobin 9.0 iron supplement as directed.  Drug-induced constipation resolved with laxative assistance.   Blood pressures were  monitored on TID basis and controlled  Diabetes has been monitored with ac/hs CBG checks and SSI was use prn for tighter BS control.   She is continent of bowel and bladder.  She has made gains during rehab stay and is attending therapies  She will continue to receive follow up therapies   after discharge  Rehab course: During patient's stay in rehab weekly team conferences were held to monitor patient's progress, set goals and discuss barriers to discharge. At admission, patient required minimal assist supine to sit, minimal assist sit to supine, moderate assist ambulate 5 feet rolling walker, moderate assist stand pivot transfers.  Min mod assist for ADLs.  Physical exam.  Blood pressure 128/54 pulse 84 temperature 98.8 respirations 18 oxygen saturation 96% room air Constitutional.  Well-developed well-nourished HEENT Head.  Normocephalic and atraumatic Mouth and throat.  No oropharyngeal exudate Eyes.  Pupils round and reactive to light no discharge without nystagmus Neck.  Supple nontender no JVD without thyromegaly Cardiovascular regular rate rhythm without any extra sounds or murmur heard Respiratory.  Effort normal no respiratory distress without wheeze GI.  Soft nontender positive bowel sounds without rebound Musculoskeletal.  Comments right upper and left upper extremity tested deltoid bicep tricep wrist extension grip finger abduction 5 out of 5  bilaterally Right lower extremity hip flexors 5 out of 5 knee extension 5 out of 5 dorsi plantarflexion 5 out of 5 EHL 5 out of 5 Left lower extremity tested hip flexors 4+ out of 5 knee extension 4+ out of 5 Skin.  Surgical bypass sites lower extremities groin sites clean and dry with some ecchymosis left foot dressing in place after ray amputation clean and dry  She  has had improvement in activity tolerance, balance, postural control as well as ability to compensate for deficits. She has had improvement in functional use RUE/LUE  and RLE/LLE as well as improvement in awareness.  Working with energy conservation techniques.  Propels wheelchair with supervision.  Focused on setting up wheelchair for transfers including parts management with extra time and cues.  Perform minimum assist pivot transfers to mat table wheelchair with difficulty coordination and foot placement while maintaining weightbearing status.  Blocked practice sit to stand with contact-guard assist to minimal assist.  Completed bed mobility with supervision and completed stand pivot minimal assist due to loss of balance for mid transfers with ADLs.  Educated on homemaking task safety as well as ongoing family teaching.  Completed stand pivot transfers to bedside commode over toilet with supervision.  Patient able to complete clothing management pre and post toileting.       Disposition: Discharge to home    Diet: Renal diet  Special Instructions: No driving smoking or alcohol  Touchdown weightbearing left foot  Continue hemodialysis as directed  Medications at discharge 1.  Tylenol as needed 2.  Zyloprim 100 mg twice daily 3.  Aspirin 81 mg p.o. daily 4.  Lipitor 10 mg daily 5.  Sensipar 30 mg p.o. daily with supper 6.  Flexeril 5 mg p.o. 3 times daily as needed 7.  Aranesp weekly with hemodialysis 8.  Hectorol 4 mcg Tuesday Thursday Saturday dialysis 9.  Ferrous gluconate 125 mcg Tuesday Thursday Saturday  dialysis 10.  Neurontin 100 mg p.o. twice daily 11.  Hydralazine 25 mg every 8 hours 12.  Hydrocodone 0.5 to 1 tablet every 6 hours as needed severe pain 13.  Levemir 8 units daily 14.  Isordil 10 mg p.o. 3 times daily 15.  Lidoderm  patch change as directed 16.  Rena-Vite 1 tablet nightly 17.  Protonix 40 mg p.o. daily 18.  Renvela 1600 mg p.o. 3 times daily with meals 19.  Trazodone 25 to 100 mg nightly  Discharge Instructions     Ambulatory referral to Physical Medicine Rehab   Complete by: As directed    Moderate complexity follow-up 1 to 2 weeks left fifth ray amputation       Follow-up Information     Jamse Arn, MD Follow up.   Specialty: Physical Medicine and Rehabilitation Why: Office to call for appointment Contact information: [address] [place] [address] [phone]         Newt Minion, MD Follow up.   Specialty: Orthopedic Surgery Why: Call for appointment Contact information: [address] [place] [address] [phone]         [name], MD Follow up.   Specialties: Vascular Surgery, Cardiology Why: Call for appointment Contact information: [place] [address] [phone]         Elmarie Shiley, MD Follow up.   Specialty: Nephrology Why: Call for appointment Contact information: [address] [place] [address] [phone]         Kerry Hough, MD .   Specialty: Pediatric Rheumatology Contact information: [id number] [place] [address] [phone]            Signed: Cathlyn Parsons 04/20/2019, 5:20 AM Patient was seen, face-face, and physical exam performed by me on day of discharge, less than 30 minutes of total time spent.. Please see progress note from day of discharge as well.  Delice Lesch, MD, ABPMR

## 2019-04-18 NOTE — Progress Notes (Signed)
Physical Therapy Session Note  Patient Details  Name: [name] MRN: [id number] Date of Birth: 1946-03-30  Today's Date: 04/18/2019 PT Individual Time: 1245-1330 PT Individual Time Calculation (min): 45 min Today's Date: 04/18/2019 PT Missed Time: 15 Minutes Missed Time Reason: Other (Comment)(Pt requested to eat lunch)  Short Term Goals: Week 1:  PT Short Term Goal 1 (Week 1): Pt will perform bed mobility CGA PT Short Term Goal 1 - Progress (Week 1): Met PT Short Term Goal 2 (Week 1): Pt will perform stand<>pivot transfer with LRAD CGA PT Short Term Goal 2 - Progress (Week 1): Met PT Short Term Goal 3 (Week 1): Pt will ambulate 52f with LRAD mod A PT Short Term Goal 3 - Progress (Week 1): Not met Week 2:  PT Short Term Goal 1 (Week 2): STG=LTG due to LOS  Skilled Therapeutic Interventions/Progress Updates:   Received pt sitting in WC, pt stated she just received lunch and requested to eat a little before participating in therapy. PT returned 15 minutes later and pt agreeable to therapy. Session focused on functional mobility/transfers, dynamic standing balance, simulated car transfer, LE strength, and improved endurance with activity. Pt with questions regarding discharge planning, therapist educated pt on energy conservation strategies, home equipment setup, transfer setup, WC parts management, RW safety, and use of caregiver assistance from sister for specific tasks when needed. Pt verbalized understanding. Pt performed WC mobility 1214fwith bilateral UE and supervision. Pt performed simulated car transfer with CGA and increased time and encouragement for task. Pt continues to have difficulty with LLE knee flexion to bring LE into car. Pt performed sit<>stand x 5 with bilateral UE support on RW CGA. Pt required multiple rest breaks throughout session due to fatigue. Pt transferred stand<>pivot without AD CGA back to bed. Concluded session with pt supine in bed, needs within reach, and  bed alarm on. 15 minutes of missed skilled physical therapy due to patient requesting to eat lunch.   Therapy Documentation Precautions:  Precautions Precautions: Fall Precaution Comments: LLE TDWB Restrictions Weight Bearing Restrictions: Yes LLE Weight Bearing: Touchdown weight bearing Other Position/Activity Restrictions: TDWB through heel per ortho note   Therapy/Group: Individual Therapy AnAlfonse AlpersT, DPT   04/18/2019, 10:46 AM

## 2019-04-18 NOTE — Progress Notes (Signed)
[place] PHYSICAL MEDICINE & REHABILITATION PROGRESS NOTE  Subjective/Complaints: Patient seen sitting up in bed this morning.  She states she slept well overnight.  She states had a good weekend.  She states that her hemodialysis was off schedule.  ROS: Denies CP, SOB, N/V/D  Objective: Vital Signs: Blood pressure (!) 128/58, pulse 78, temperature 98.9 F (37.2 C), temperature source Oral, resp. rate 16, height 5' (1.524 m), weight 66.9 kg, SpO2 95 %. No results found. Recent Labs    04/17/19 0732 04/18/19 0631  WBC 9.8 6.4  HGB 9.4* 9.0*  HCT 30.8* 29.0*  PLT 243 218   Recent Labs    04/17/19 0732 04/18/19 0631  NA 134* 136  K 4.6 3.8  CL 95* 95*  CO2 25 29  GLUCOSE 97 109*  BUN 60* 41*  CREATININE 6.98* 5.41*  CALCIUM 7.8* 7.8*    Physical Exam: BP (!) 128/58 (BP Location: Left Arm)   Pulse 78   Temp 98.9 F (37.2 C) (Oral)   Resp 16   Ht 5' (1.524 m)   Wt 66.9 kg   SpO2 95%   BMI 28.80 kg/m  Constitutional: No distress . Vital signs reviewed.  Obese. HENT: Normocephalic.  Atraumatic. Eyes: EOMI. No discharge. Cardiovascular: No JVD. Respiratory: Normal effort.  No stridor. GI: Non-distended. Skin: Left lower extremity incision dressing C/D/I Psych: Normal mood.  Normal behavior. Musc: Left fifth metatarsal ray amputation with tenderness Neuro: Alert Motor: RLE: 5/5 proximal to distal, unchanged LLE: HF, KE 5/5, ADF 4/5, some pain inhibition)  Assessment/Plan: 1. Functional deficits secondary to debility which require 3+ hours per day of interdisciplinary therapy in a comprehensive inpatient rehab setting.  Physiatrist is providing close team supervision and 24 hour management of active medical problems listed below.  Physiatrist and rehab team continue to assess barriers to discharge/monitor patient progress toward functional and medical goals  Care Tool:  Bathing    Body parts bathed by patient: Right arm, Left arm, Chest, Abdomen, Front  perineal area, Face   Body parts bathed by helper: Buttocks     Bathing assist Assist Level: Minimal Assistance - Patient > 75%     Upper Body Dressing/Undressing Upper body dressing Upper body dressing/undressing activity did not occur (including orthotics): N/A What is the patient wearing?: Pull over shirt    Upper body assist Assist Level: Set up assist    Lower Body Dressing/Undressing Lower body dressing    Lower body dressing activity did not occur: N/A What is the patient wearing?: Pants     Lower body assist Assist for lower body dressing: Contact Guard/Touching assist     Toileting Toileting Toileting Activity did not occur (Clothing management and hygiene only): Refused  Toileting assist Assist for toileting: Contact Guard/Touching assist     Transfers Chair/bed transfer  Transfers assist  Chair/bed transfer activity did not occur: Safety/medical concerns  Chair/bed transfer assist level: Contact Guard/Touching assist Chair/bed transfer assistive device: Walker, Clinical biochemist   Ambulation assist      Assist level: Minimal Assistance - Patient > 75% Assistive device: Walker-rolling Max distance: 69ft   Walk 10 feet activity   Assist  Walk 10 feet activity did not occur: Safety/medical concerns(LLE TDWB status, LE weakness, fatigue)        Walk 50 feet activity   Assist Walk 50 feet with 2 turns activity did not occur: Safety/medical concerns(LLE TDWB status, LE weakness, fatigue)         Walk 150  feet activity   Assist Walk 150 feet activity did not occur: Safety/medical concerns(LLE TDWB status, LE weakness, fatigue)         Walk 10 feet on uneven surface  activity   Assist Walk 10 feet on uneven surfaces activity did not occur: Safety/medical concerns(LLE TDWB status, LE weakness, fatigue)         Wheelchair     Assist Will patient use wheelchair at discharge?: Yes Type of Wheelchair: Manual     Wheelchair assist level: Supervision/Verbal cueing Max wheelchair distance: 156ft    Wheelchair 50 feet with 2 turns activity    Assist    Wheelchair 50 feet with 2 turns activity did not occur: (fatigue, increased R shoulder pain)   Assist Level: Supervision/Verbal cueing   Wheelchair 150 feet activity     Assist Wheelchair 150 feet activity did not occur: (fatigue, increased R shoulder pain)   Assist Level: Maximal Assistance - Patient 25 - 49%      Medical Problem List and Plan: 1.  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  Touchdown weightbearing left lower extremity, just for balance  Continue CIR 2.  Antithrombotics: -DVT/anticoagulation: ?None needed after recent peripheral vascular surgery             -antiplatelet therapy: Aspirin 81 mg daily 3. Pain Management: Flexeril and hydrocodone as needed  Continue gabapentin  Controlled on 1/11 with medications 4. Mood: Trazodone nightly.  Provide emotional support             -antipsychotic agents: N/A 5. Neuropsych: This patient is capable of making decisions on her own behalf. 6. Skin/Wound Care: Routine skin checks for incisions from fempop bypass and L 5th ray amputation  Continue dressing changes with gauze, kerlix, and ace wrap  Monitor for wound healing 7. Fluids/Electrolytes/Nutrition: Routine in and outs 8.  ESRD.  Continue hemodialysis as directed- T/H/S 9. Hypertension.  Hydralazine 25 mg every 8 hours, Isordil 10 mg 3 times daily.  Vitals:   04/17/19 1952 04/18/19 0629  BP: (!) 118/48 (!) 128/58  Pulse: 78 78  Resp: 16 16  Temp: 99.2 F (37.3 C) 98.9 F (37.2 C)  SpO2: 95% 95%    Controlled on 1/11  Monitor with increased mobility 10.  Hyperlipidemia.   Lipitor 11.  History of gout.  Continue Zyloprim twice daily.  Monitor for gout flareups. 12.  Diabetes mellitus type II with hyperglycemia.    Levemir 10 units daily decreased to 8  Check blood sugars before meals and at bedtime   CBG (last 3)  Recent Labs    04/17/19 1636 04/17/19 2107 04/18/19 0624  GLUCAP 207* 76 104*   Labile on 1/11  Monitor with increased mobility 13. Osteomyelitis- Completed course of IV ABX  14.  Acute on chronic anemia- restart Iron per Nephro  Hemoglobin 9.0 on 1/11  Continue to monitor 15. Drug induced constipation  KUB personally reviewed, suggesting constipation  Bowel meds increased on 1/1, adjusted further 1/4, d/ced on 1/6  Will consider Fiber  Hemorrhoid cream for hemorrhoid Sx's  16. Leukocytosis: Resolved  WBC 7.3 on 1/4 17. Sciatic/piriformis syndrome  Continue gabapentin 100mg  BID - improved sciatic pain   -tennis ball  Lidocaine patch ordered  See #3  LOS: 11 days A FACE TO FACE EVALUATION WAS  PERFORMED  Lucy Woolever Lorie Phenix 04/18/2019, 8:51 AM

## 2019-04-19 ENCOUNTER — Inpatient Hospital Stay (HOSPITAL_COMMUNITY): Payer: Medicare Other | Admitting: Occupational Therapy

## 2019-04-19 ENCOUNTER — Ambulatory Visit (HOSPITAL_COMMUNITY): Payer: Medicare Other

## 2019-04-19 ENCOUNTER — Encounter: Payer: Medicare Other | Admitting: Vascular Surgery

## 2019-04-19 ENCOUNTER — Encounter (HOSPITAL_COMMUNITY): Payer: Medicare Other | Admitting: Occupational Therapy

## 2019-04-19 LAB — RENAL FUNCTION PANEL
Albumin: 2.7 g/dL — ABNORMAL LOW (ref 3.5–5.0)
Anion gap: 15 (ref 5–15)
BUN: 64 mg/dL — ABNORMAL HIGH (ref 8–23)
CO2: 24 mmol/L (ref 22–32)
Calcium: 7.8 mg/dL — ABNORMAL LOW (ref 8.9–10.3)
Chloride: 94 mmol/L — ABNORMAL LOW (ref 98–111)
Creatinine, Ser: 6.85 mg/dL — ABNORMAL HIGH (ref 0.44–1.00)
GFR calc Af Amer: 6 mL/min — ABNORMAL LOW (ref >60)
GFR calc non Af Amer: 5 mL/min — ABNORMAL LOW (ref >60)
Glucose, Bld: 108 mg/dL — ABNORMAL HIGH (ref 70–99)
Phosphorus: 4.3 mg/dL (ref 2.5–4.6)
Potassium: 3.9 mmol/L (ref 3.5–5.1)
Sodium: 133 mmol/L — ABNORMAL LOW (ref 135–145)

## 2019-04-19 LAB — CBC
HCT: 29.6 % — ABNORMAL LOW (ref 36.0–46.0)
Hemoglobin: 9.1 g/dL — ABNORMAL LOW (ref 12.0–15.0)
MCH: 30.8 pg (ref 26.0–34.0)
MCHC: 30.7 g/dL (ref 30.0–36.0)
MCV: 100.3 fL — ABNORMAL HIGH (ref 80.0–100.0)
Platelets: 234 10*3/uL (ref 150–400)
RBC: 2.95 MIL/uL — ABNORMAL LOW (ref 3.87–5.11)
RDW: 16.9 % — ABNORMAL HIGH (ref 11.5–15.5)
WBC: 7 10*3/uL (ref 4.0–10.5)
nRBC: 0 % (ref 0.0–0.2)

## 2019-04-19 LAB — GLUCOSE, CAPILLARY
Glucose-Capillary: 87 mg/dL (ref 70–99)
Glucose-Capillary: 112 mg/dL — ABNORMAL HIGH (ref 70–99)
Glucose-Capillary: 114 mg/dL — ABNORMAL HIGH (ref 70–99)
Glucose-Capillary: 104 mg/dL — ABNORMAL HIGH (ref 70–99)

## 2019-04-19 MED ORDER — LIDOCAINE 5 % EX PTCH: 1.0000 | MEDICATED_PATCH | CUTANEOUS | 0 refills | Status: DC

## 2019-04-19 MED ORDER — SODIUM CHLORIDE 0.9 % IV SOLN: 125.0000 mg | INTRAVENOUS | Status: DC

## 2019-04-19 MED ORDER — PANTOPRAZOLE SODIUM 40 MG PO TBEC: 40.0000 mg | DELAYED_RELEASE_TABLET | Freq: Every day | ORAL | 0 refills | Status: DC

## 2019-04-19 MED ORDER — LIDOCAINE-PRILOCAINE 2.5-2.5 % EX CREA: 1.0000 "application " | TOPICAL_CREAM | CUTANEOUS | Status: DC | PRN

## 2019-04-19 MED ORDER — DOXERCALCIFEROL 4 MCG/2ML IV SOLN: 4.0000 ug | INTRAVENOUS | Status: DC

## 2019-04-19 MED ORDER — PENTAFLUOROPROP-TETRAFLUOROETH EX AERO: 1.0000 "application " | INHALATION_SPRAY | CUTANEOUS | Status: DC | PRN

## 2019-04-19 MED ORDER — TRAZODONE HCL 50 MG PO TABS: ORAL_TABLET | ORAL | 2 refills | Status: DC

## 2019-04-19 MED ORDER — LIDOCAINE HCL (PF) 1 % IJ SOLN: 5.0000 mL | INTRAMUSCULAR | Status: DC | PRN

## 2019-04-19 MED ORDER — ISOSORBIDE DINITRATE 10 MG PO TABS: ORAL_TABLET | ORAL | 3 refills | Status: DC

## 2019-04-19 MED ORDER — SEVELAMER CARBONATE 800 MG PO TABS: 1600.0000 mg | ORAL_TABLET | Freq: Three times a day (TID) | ORAL | 1 refills | Status: DC

## 2019-04-19 MED ORDER — CYCLOBENZAPRINE HCL 5 MG PO TABS: 5.0000 mg | ORAL_TABLET | Freq: Three times a day (TID) | ORAL | 0 refills | Status: DC | PRN

## 2019-04-19 MED ORDER — ALLOPURINOL 100 MG PO TABS: ORAL_TABLET | ORAL | 3 refills | Status: DC

## 2019-04-19 MED ORDER — GABAPENTIN 100 MG PO CAPS: 100.0000 mg | ORAL_CAPSULE | Freq: Two times a day (BID) | ORAL | 1 refills | Status: DC

## 2019-04-19 MED ORDER — HYDRALAZINE HCL 25 MG PO TABS: 25.0000 mg | ORAL_TABLET | Freq: Three times a day (TID) | ORAL | 1 refills | Status: DC

## 2019-04-19 MED ORDER — ACETAMINOPHEN 325 MG PO TABS: 325.0000 mg | ORAL_TABLET | ORAL | Status: DC | PRN

## 2019-04-19 MED ORDER — DARBEPOETIN ALFA 100 MCG/0.5ML IJ SOSY: 100.0000 ug | PREFILLED_SYRINGE | INTRAMUSCULAR | Status: DC

## 2019-04-19 MED ORDER — DARBEPOETIN ALFA 100 MCG/0.5ML IJ SOSY
PREFILLED_SYRINGE | INTRAMUSCULAR | Status: AC
  Administered 2019-04-19: 100 ug via INTRAVENOUS
  Filled 2019-04-19: qty 0.5

## 2019-04-19 MED ORDER — CINACALCET HCL 30 MG PO TABS: 30.0000 mg | ORAL_TABLET | Freq: Every day | ORAL | 1 refills | Status: DC

## 2019-04-19 MED ORDER — DOXERCALCIFEROL 4 MCG/2ML IV SOLN
INTRAVENOUS | Status: AC
  Administered 2019-04-19: 4 ug via INTRAVENOUS
  Filled 2019-04-19: qty 2

## 2019-04-19 MED ORDER — SODIUM CHLORIDE 0.9 % IV SOLN: 100.0000 mL | INTRAVENOUS | Status: DC | PRN

## 2019-04-19 MED ORDER — ATORVASTATIN CALCIUM 10 MG PO TABS: 10.0000 mg | ORAL_TABLET | Freq: Every day | ORAL | 0 refills | Status: DC

## 2019-04-19 MED ORDER — HYDROCODONE-ACETAMINOPHEN 5-325 MG PO TABS: 0.5000 | ORAL_TABLET | Freq: Four times a day (QID) | ORAL | 0 refills | Status: DC | PRN

## 2019-04-19 NOTE — Progress Notes (Signed)
Family education done on dressing change, all questions and concerns addressed, both patient and family member expressed understanding of the education.

## 2019-04-19 NOTE — Progress Notes (Signed)
Physical Therapy Discharge Summary  Patient Details  Name: [name] MRN: [id number] Date of Birth: Aug 11, 1945  Today's Date: 04/19/2019 PT Individual Time: 1100-1157 PT Individual Time Calculation (min): 57 min   Patient has met 8 of 8 long term goals due to improved activity tolerance, improved balance, improved postural control, increased strength and improved coordination.  Patient to discharge at a wheelchair level Supervision. Patient's care partner is independent to provide the necessary physical assistance at discharge. Husband and sister present for family education today and verbalized and demonstrated (sister) confidence with all tasks to prepare for discharge home.   All goals met  Recommendation:  Patient will benefit from ongoing skilled PT services in home health setting to continue to advance safe functional mobility, address ongoing impairments in transfers, LE strength, dynamic standing balance, ambulation, endurance, and to minimize fall risk.  Equipment: No equipment provided  Reasons for discharge: treatment goals met  Patient/family agrees with progress made and goals achieved: Yes  Today's Interventions Received pt sitting in WC, pt agreeable to therapy, and denied any pain throughout session. Sister and husband present for family education training. Pt performed WC mobility 199f with bilateral UE independently. Pt performed car transfer x 1 with therapist with supervision and x 1 with sister with supervision. Sister educated on safe transfer setup and body positioning with transfer. Sister verbalized and demonstrated confidence with task. Pt transferred sit<>stand with RW supervision x 1 trial with sister. Sister educated to remain on pt's weaker side (L) in case of any LOB; sister verbalized understanding. [facility]. Therapist provided pt with HEP printout and educated pt/sister on technique for performing bridges, seated hip flexion  and abduction with orange TB, and sidelying clamshells. Pt and sister verbalized understanding of HEP. Family verbalized and demonstrated confidence with all tasks to prepare for safe discharge home. Concluded session with pt sitting in WC, needs within reach, and chair pad alarm on.   PT Discharge Precautions/Restrictions Precautions Precautions: Fall Precaution Comments: LLE TDWB Restrictions Weight Bearing Restrictions: Yes LLE Weight Bearing: Touchdown weight bearing Other Position/Activity Restrictions: TDWB LLE Cognition Overall Cognitive Status: Within Functional Limits for tasks assessed Arousal/Alertness: Awake/alert Orientation Level: Oriented X4 Awareness: Appears intact Problem Solving: Appears intact Safety/Judgment: Appears intact Sensation Sensation Light Touch: Impaired by gross assessment Proprioception: Impaired by gross assessment Additional Comments: peripheral neuropathy, decreased sensation bilaterally below ankles. Coordination Gross Motor Movements are Fluid and Coordinated: No Fine Motor Movements are Fluid and Coordinated: No Coordination and Movement Description: grossly uncoordinated due to LLE TDWB status and LE weakness Motor  Motor Motor: Abnormal postural alignment and control Motor - Skilled Clinical Observations: grossly uncoordinated due to LLE TDWB status and LE weakness  Mobility Bed Mobility Bed Mobility: Rolling Right;Rolling Left;Sit to Supine;Supine to Sit Rolling Right: Independent Rolling Left: Independent Supine to Sit: Independent Sit to Supine: Independent Transfers Transfers: Sit to SBank of AmericaTransfers Sit to Stand: Supervision/Verbal cueing Stand Pivot Transfers: Supervision/Verbal cueing Stand Pivot Transfer Details: Verbal cues for precautions/safety;Verbal cues for technique Stand Pivot Transfer Details (indicate cue type and reason): verbal cues for LLE TDWB status Transfer (Assistive device): REducation officer, museumMobility: Yes Wheelchair Assistance: Independent with assistive device Wheelchair Propulsion: Both upper extremities Wheelchair Parts Management: Supervision/cueing Distance: 150  Trunk/Postural Assessment  Cervical Assessment Cervical Assessment: Within Functional Limits Thoracic Assessment Thoracic Assessment: Within Functional Limits Lumbar Assessment Lumbar Assessment: Exceptions to WFL(posterior pelvic tilt) Postural Control Postural Control: Deficits on evaluation  Balance Balance Balance Assessed: Yes Static Sitting Balance Static Sitting - Balance Support: No upper extremity supported Static Sitting - Level of Assistance: 7: Independent Dynamic Sitting Balance Dynamic Sitting - Balance Support: No upper extremity supported Dynamic Sitting - Level of Assistance: 7: Independent Static Standing Balance Static Standing - Balance Support: Bilateral upper extremity supported Static Standing - Level of Assistance: 5: Stand by assistance Dynamic Standing Balance Dynamic Standing - Balance Support: Bilateral upper extremity supported Dynamic Standing - Level of Assistance: 5: Stand by assistance Extremity Assessment  RLE Assessment RLE Assessment: Exceptions to [facility] General Strength Comments: grossly generalized to 4/5 LLE Assessment LLE Assessment: Exceptions to [facility] General Strength Comments: Grossly generalized to 4-/5   Alfonse Alpers PT, DPT  04/19/2019, 7:51 AM

## 2019-04-19 NOTE — Progress Notes (Signed)
[facility] Progress Note   Subjective: Planning for DC tomorrow.   Objective Vitals:   04/18/19 0629 04/18/19 1406 04/18/19 1932 04/19/19 0412  BP: (!) 128/58 (!) 123/49 (!) 129/56 (!) 116/51  Pulse: 78 78 76 81  Resp: 16 18 19 19   Temp: 98.9 F (37.2 C) 98.4 F (36.9 C) 98.5 F (36.9 C) 99.3 F (37.4 C)  TempSrc: Oral Oral  Oral  SpO2: 95% 100% 100% 94%  Weight: 66.9 kg     Height:       Physical Exam General:Pleasant older female in NAD HGDJM:E2,A8 1/6 systolic M. No R/G Lungs:CTAB Abdomen:S, NT, ND.Active BS. Extremities:Drsg L foot with off loading shoe. No LE edema.  Dialysis Access:R AVF +T/B.    Additional Objective Labs: Basic Metabolic Panel: Recent Labs  Lab 04/14/19 1430 04/17/19 0732 04/18/19 0631  NA 134* 134* 136  K 4.1 4.6 3.8  CL 96* 95* 95*  CO2 25 25 29   GLUCOSE 162* 97 109*  BUN 53* 60* 41*  CREATININE 6.03* 6.98* 5.41*  CALCIUM 7.9* 7.8* 7.8*  PHOS 3.9 4.2 3.8   Liver Function Tests: Recent Labs  Lab 04/14/19 1430 04/17/19 0732 04/18/19 0631  ALBUMIN 2.6* 2.6* 2.5*   No results for input(s): LIPASE, AMYLASE in the last 168 hours. CBC: Recent Labs  Lab 04/14/19 1430 04/17/19 0732 04/18/19 0631  WBC 6.0 9.8 6.4  NEUTROABS  --   --  4.2  HGB 9.0* 9.4* 9.0*  HCT 29.5* 30.8* 29.0*  MCV 103.1* 101.7* 100.0  PLT 225 243 218   Blood Culture    Component Value Date/Time   SDES BLOOD BLOOD LEFT FOREARM 03/23/2019 1538   SPECREQUEST  03/23/2019 1538    BOTTLES DRAWN AEROBIC AND ANAEROBIC Blood Culture results may not be optimal due to an excessive volume of blood received in culture bottles   CULT  03/23/2019 1538    NO GROWTH 5 DAYS Performed at [facility] [address], [place], [place] [address]    REPTSTATUS 03/28/2019 FINAL 03/23/2019 1538    Cardiac Enzymes: No results for input(s): CKTOTAL, CKMB, CKMBINDEX, TROPONINI in the last 168 hours. CBG: Recent Labs  Lab 04/18/19 1231  04/18/19 1721 04/18/19 2116 04/19/19 0556 04/19/19 1203  GLUCAP 123* 136* 177* 87 112*   Iron Studies: No results for input(s): IRON, TIBC, TRANSFERRIN, FERRITIN in the last 72 hours. @lablastinr3 @ Studies/Results: No results found. Medications: . ferric gluconate (FERRLECIT/NULECIT) IV Stopped (04/14/19 1617)   . allopurinol  100 mg Oral BID  . aspirin EC  81 mg Oral Daily  . atorvastatin  10 mg Oral q1800  . Chlorhexidine Gluconate Cloth  6 each Topical Q0600  . Chlorhexidine Gluconate Cloth  6 each Topical Q0600  . cinacalcet  30 mg Oral Q supper  . darbepoetin (ARANESP) injection - DIALYSIS  100 mcg Intravenous Q Tue-HD  . doxercalciferol  4 mcg Intravenous Q T,Th,Sa-HD  . feeding supplement (NEPRO CARB STEADY)  237 mL Oral BID BM  . feeding supplement (PRO-STAT SUGAR FREE 64)  30 mL Oral BID  . gabapentin  100 mg Oral BID  . hydrALAZINE  25 mg Oral Q8H  . hydrocortisone   Rectal BID  . insulin aspart  0-15 Units Subcutaneous TID WC  . insulin detemir  8 Units Subcutaneous Daily  . isosorbide dinitrate  10 mg Oral TID  . lidocaine  1 patch Transdermal Q24H  . multivitamin  1 tablet Oral QHS  . pantoprazole  40  mg Oral Daily  . sevelamer carbonate  1,600 mg Oral TID WC  . traZODone  25-100 mg Oral QHS     Dialysis Orders: [facility] 350/800 3K/2.25 bath 72kg Heparin5000 Units IV TIW - vancomycin 750 mg q. HD(now D/c'd) - Mircera 50 mcg every 2 wks - Hectorol 4 mcg IV tiw  Assessment/Plan: 1. Left 5th metatarsal osteomyelitis/ septic arthritis:S/pL femendarterectomy 12/21(hx priorLfem-pop),then L 5th ray ampDr Sharol Given on 12/30.Off abx, in CIR for rehab. 2. ESRD: Continue HD per TTS schedule. Truncated treatments D/T high census. Use modified heparin dose.  3. HTN/volume: BP stable, below prior EDW. Follow. 4. Anemia: Hgb 9.0 - continue Aranesp 132mcg IV weekly. Tsat 15% on 1/7 - getting course of IV iron. 5.Metabolic Bone Disease- Corr Ca/Phosat  goal. Continue Renvela binder/VDRA.  6. Nutrition - Continue protein supplement for low albumin  [name] [name] H. Lin Hackmann NP-C 04/19/2019, 12:39 PM  [employer] [phone]

## 2019-04-19 NOTE — Care Management (Signed)
The overall goal for the admission was met for:   Discharge location: Yes; home with spouse, [name].  Length of Stay: 13 days with discharge date set for 04/20/19  Discharge activity level: Supervision level overall  Home/community participation: Yes  Services provided included: MD, RD, PT, OT, RN, CM, Pharmacy, Neuropsych and SW  Financial Services: Medicare/BCBS  Follow-up services arranged: Home Health: PT, OT, RN with [facility], DME: 3 n 1 from [employer] and Patient/Family has no preference for HH/DME agencies  Comments (or additional information):Husband with recent hip surgery, Sister [name] in from [place] to help as needed. Family Education completed on 04/19/19. [facility] notified to resume usual schedule for HD on Thursday 04/21/19.  Patient/Family verbalized understanding of follow-up arrangements: Yes  Individual responsible for coordination of the follow-up plan: [name] /spouse [name]  [phone] (H) or [phone] (M) with patient's Sister: [name] [phone]  Confirmed correct DME delivered: [name], [name] 04/19/2019    [name], [name] 

## 2019-04-19 NOTE — Discharge Instructions (Signed)
Inpatient Rehab Discharge Instructions  [place] Discharge date and time: No discharge date for patient encounter.   Activities/Precautions/ Functional Status: Activity: activity as tolerated Diet: renal diet Wound Care: keep wound clean and dry Functional status:  ___ No restrictions     ___ Walk up steps independently ___ 24/7 supervision/assistance   ___ Walk up steps with assistance ___ Intermittent supervision/assistance  ___ Bathe/dress independently ___ Walk with walker     _x__ Bathe/dress with assistance ___ Walk Independently    ___ Shower independently ___ Walk with assistance    ___ Shower with assistance ___ No alcohol     ___ Return to work/school ________    COMMUNITY REFERRALS UPON DISCHARGE:    Home Health:   PT     OT     RN                     Agency:  [facility] Phone: [phone]   Medical Equipment/Items Ordered:  Commode                                                      Agency/Supplier:  [facility] @ [phone]      Special Instructions: No driving smoking or alcohol  Continue dialysis as directed   My questions have been answered and I understand these instructions. I will adhere to these goals and the provided educational materials after my discharge from the hospital.  Patient/Caregiver Signature _______________________________ Date __________  Clinician Signature _______________________________________ Date __________  Please bring this form and your medication list with you to all your follow-up doctor's appointments.

## 2019-04-19 NOTE — Progress Notes (Signed)
[place] PHYSICAL MEDICINE & REHABILITATION PROGRESS NOTE  Subjective/Complaints: Patient seen sitting up in bed this morning.  She states she slept well overnight.  She has questions regarding my name.  She states she is looking forward to discharge tomorrow.  ROS: Denies CP, SOB, N/V/D  Objective: Vital Signs: Blood pressure (!) 116/51, pulse 81, temperature 99.3 F (37.4 C), temperature source Oral, resp. rate 19, height 5' (1.524 m), weight 66.9 kg, SpO2 94 %. No results found. Recent Labs    04/17/19 0732 04/18/19 0631  WBC 9.8 6.4  HGB 9.4* 9.0*  HCT 30.8* 29.0*  PLT 243 218   Recent Labs    04/17/19 0732 04/18/19 0631  NA 134* 136  K 4.6 3.8  CL 95* 95*  CO2 25 29  GLUCOSE 97 109*  BUN 60* 41*  CREATININE 6.98* 5.41*  CALCIUM 7.8* 7.8*    Physical Exam: BP (!) 116/51 (BP Location: Left Arm)   Pulse 81   Temp 99.3 F (37.4 C) (Oral)   Resp 19   Ht 5' (1.524 m)   Wt 66.9 kg   SpO2 94%   BMI 28.80 kg/m  Constitutional: No distress . Vital signs reviewed.  Obese. HENT: Normocephalic.  Atraumatic. Eyes: EOMI. No discharge. Cardiovascular: No JVD. Respiratory: Normal effort.  No stridor. GI: Non-distended. Skin: Left lower extremity incision with dressing C/D/I Psych: Normal mood.  Normal behavior. Musc: Left fifth metatarsal ray amputation with tenderness Neuro: Alert Motor: RLE: 5/5 proximal to distal, stable LLE: HF, KE 5/5, ADF 4/5, some pain inhibition), stable  Assessment/Plan: 1. Functional deficits secondary to debility which require 3+ hours per day of interdisciplinary therapy in a comprehensive inpatient rehab setting.  Physiatrist is providing close team supervision and 24 hour management of active medical problems listed below.  Physiatrist and rehab team continue to assess barriers to discharge/monitor patient progress toward functional and medical goals  Care Tool:  Bathing    Body parts bathed by patient: Right arm, Left arm,  Chest, Abdomen, Front perineal area, Face   Body parts bathed by helper: Buttocks     Bathing assist Assist Level: Minimal Assistance - Patient > 75%     Upper Body Dressing/Undressing Upper body dressing Upper body dressing/undressing activity did not occur (including orthotics): N/A What is the patient wearing?: Pull over shirt    Upper body assist Assist Level: Set up assist    Lower Body Dressing/Undressing Lower body dressing    Lower body dressing activity did not occur: N/A What is the patient wearing?: Pants     Lower body assist Assist for lower body dressing: Contact Guard/Touching assist     Toileting Toileting Toileting Activity did not occur (Clothing management and hygiene only): Refused  Toileting assist Assist for toileting: Contact Guard/Touching assist     Transfers Chair/bed transfer  Transfers assist  Chair/bed transfer activity did not occur: Safety/medical concerns  Chair/bed transfer assist level: Contact Guard/Touching assist Chair/bed transfer assistive device: Walker, Clinical biochemist   Ambulation assist      Assist level: Minimal Assistance - Patient > 75% Assistive device: Walker-rolling Max distance: 80ft   Walk 10 feet activity   Assist  Walk 10 feet activity did not occur: Safety/medical concerns(LLE TDWB status, LE weakness, fatigue)        Walk 50 feet activity   Assist Walk 50 feet with 2 turns activity did not occur: Safety/medical concerns(LLE TDWB status, LE weakness, fatigue)  Walk 150 feet activity   Assist Walk 150 feet activity did not occur: Safety/medical concerns(LLE TDWB status, LE weakness, fatigue)         Walk 10 feet on uneven surface  activity   Assist Walk 10 feet on uneven surfaces activity did not occur: Safety/medical concerns(LLE TDWB status, LE weakness, fatigue)         Wheelchair     Assist Will patient use wheelchair at discharge?: Yes Type of  Wheelchair: Manual    Wheelchair assist level: Supervision/Verbal cueing Max wheelchair distance: 135ft    Wheelchair 50 feet with 2 turns activity    Assist    Wheelchair 50 feet with 2 turns activity did not occur: (fatigue, increased R shoulder pain)   Assist Level: Supervision/Verbal cueing   Wheelchair 150 feet activity     Assist Wheelchair 150 feet activity did not occur: (fatigue, increased R shoulder pain)   Assist Level: Maximal Assistance - Patient 25 - 49%      Medical Problem List and Plan: 1.  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  Touchdown weightbearing left lower extremity, just for balance  Continue CIR 2.  Antithrombotics: -DVT/anticoagulation: ?None needed after recent peripheral vascular surgery             -antiplatelet therapy: Aspirin 81 mg daily 3. Pain Management: Flexeril and hydrocodone as needed  Continue gabapentin  Controlled on 1/12 with medications 4. Mood: Trazodone nightly.  Provide emotional support             -antipsychotic agents: N/A 5. Neuropsych: This patient is capable of making decisions on her own behalf. 6. Skin/Wound Care: Routine skin checks for incisions from fempop bypass and L 5th ray amputation  Continue dressing changes with gauze, kerlix, and ace wrap  Monitor for wound healing 7. Fluids/Electrolytes/Nutrition: Routine in and outs 8.  ESRD.  Continue hemodialysis as directed- T/H/S 9. Hypertension.  Hydralazine 25 mg every 8 hours, Isordil 10 mg 3 times daily.  Vitals:   04/18/19 1932 04/19/19 0412  BP: (!) 129/56 (!) 116/51  Pulse: 76 81  Resp: 19 19  Temp: 98.5 F (36.9 C) 99.3 F (37.4 C)  SpO2: 100% 94%    Relatively controlled on 1/12  Monitor with increased  mobility 10.  Hyperlipidemia.  Lipitor 11.  History of gout.  Continue Zyloprim twice daily.  Monitor for gout flareups. 12.  Diabetes mellitus type II with hyperglycemia.    Levemir 10 units daily decreased to 8  Check blood sugars before meals and at bedtime   CBG (last 3)  Recent Labs    04/18/19 1721 04/18/19 2116 04/19/19 0556  GLUCAP 136* 177* 87   Labile on 1/12, p.o. intake somewhat variable  Monitor with increased mobility 13. Osteomyelitis- Completed course of IV ABX  14.  Acute on chronic anemia- restart Iron per Nephro  Hemoglobin 9.0 on 1/11  Continue to monitor 15. Drug induced constipation  KUB personally reviewed, suggesting constipation  Bowel meds increased on 1/1, adjusted further 1/4, d/ced on 1/6  Will consider Fiber  Hemorrhoid cream for hemorrhoid Sx's  16. Leukocytosis: Resolved  WBC 7.3 on 1/4 17. Sciatic/piriformis syndrome  Continue gabapentin 100mg  BID - improved sciatic pain   -tennis ball  Lidocaine patch  See #3  Improved  LOS:  12 days A FACE TO FACE EVALUATION WAS PERFORMED  Doralee Kocak Lorie Phenix 04/19/2019, 8:53 AM

## 2019-04-19 NOTE — Progress Notes (Signed)
Occupational Therapy Discharge Summary  Patient Details  Name: [name] MRN: [id number] Date of Birth: [birth year]   Patient has met 9 of 9 long term goals due to improved activity tolerance, improved balance, postural control, ability to compensate for deficits and improved awareness.  Patient to discharge at overall Supervision level.  Patient's care partner is independent to provide the necessary supervision assistance at discharge. Pt's husband and sister present for family education and have demonstrated and verbalized recommendation for supervision during dynamic standing and transfers.  Reasons goals not met: N/A  Recommendation:  Patient will benefit from ongoing skilled OT services in home health setting to continue to advance functional skills in the area of BADL and Reduce care partner burden.  Equipment: 3 in1   Reasons for discharge: treatment goals met and discharge from hospital  Patient/family agrees with progress made and goals achieved: Yes  OT Discharge Precautions/Restrictions  Precautions Precautions: Fall Precaution Comments: LLE TDWB Restrictions Weight Bearing Restrictions: Yes LLE Weight Bearing: Touchdown weight bearing Other Position/Activity Restrictions: TDWB LLE Pain Pain Assessment Pain Scale: 0-10 Pain Score: 0-No pain ADL ADL Eating: Independent Grooming: Modified independent Where Assessed-Grooming: Sitting at sink Upper Body Bathing: Setup Where Assessed-Upper Body Bathing: Sitting at sink Lower Body Bathing: Supervision/safety Where Assessed-Lower Body Bathing: Standing at sink, Sitting at sink Upper Body Dressing: Setup Where Assessed-Upper Body Dressing: Sitting at sink Lower Body Dressing: Supervision/safety Where Assessed-Lower Body Dressing: Sitting at sink, Standing at sink Toileting: Supervision/safety Where Assessed-Toileting: Glass blower/designer: Close supervision Toilet Transfer Method: Occupational hygienist: Bedside commode Vision Baseline Vision/History: Wears glasses Wears Glasses: Reading only Patient Visual Report: No change from baseline Vision Assessment?: No apparent visual deficits Perception  Perception: Within Functional Limits Praxis Praxis: Intact Cognition Overall Cognitive Status: Within Functional Limits for tasks assessed Arousal/Alertness: Awake/alert Orientation Level: Oriented X4 Attention: Alternating Alternating Attention: Appears intact Memory: Appears intact Awareness: Appears intact Problem Solving: Appears intact Safety/Judgment: Appears intact Sensation Sensation Light Touch: Impaired by gross assessment Proprioception: Impaired by gross assessment Additional Comments: peripheral neuropathy, decreased sensation bilaterally below ankles. Decreased proprioception RLE, unable to test on LLE. Coordination Gross Motor Movements are Fluid and Coordinated: No Fine Motor Movements are Fluid and Coordinated: No Coordination and Movement Description: grossly uncoordinated due to LLE TDWB status and LE weakness Finger Nose Finger Test: mild dysmetria L>R Extremity/Trunk Assessment RUE Assessment RUE Assessment: Exceptions to [facility] Passive Range of Motion (PROM) Comments: WNL Active Range of Motion (AROM) Comments: WNL General Strength Comments: shoulder flexion 3/5, 4/5 distal LUE Assessment LUE Assessment: Exceptions to [facility] Active Range of Motion (AROM) Comments: WNL General Strength Comments: grossly 4/5   Herberth Deharo, [facility] 04/19/2019, 8:54 AM

## 2019-04-19 NOTE — Progress Notes (Signed)
Occupational Therapy Session Note  Patient Details  Name: [name] MRN: [id number] Date of Birth: [birth year]  Today's Date: 04/19/2019 OT Individual Time: 0800-0900 and 1000-1100 OT Individual Time Calculation (min): 60 min and 60 min   Short Term Goals: Week 2:  OT Short Term Goal 1 (Week 2): STG = LTGs due to remaining LOS  Skilled Therapeutic Interventions/Progress Updates:    1) Completed ADL retraining at overall supervision level.  Pt completed bed mobility and stand pivot transfer bed > w/c with RW with supervision.  Pt completed all bathing and dressing at sit > stand level with supervision.  Recommend pt have supervision during all standing tasks due to weakness and instability.  Again discussed home setup and elevated toilet seat vs 3 in 1 next to bed.  Pt returned to bed stand pivot without AD supervision.  2) Treatment session with focus on hands on family education with pt's husband and sister.  Pt asleep upon arrival, aroused with increased time.  Discussed home bathroom setup, husband reports they have a transport chair that should easily fit in bathroom.  Encouraged family members to assess home setup with w/c and transport chair this afternoon/evening prior to d/c home tomorrow.  Pt completed stand pivot transfer bed > w/c with RW with supervision.  Simulated LB bathing and dressing at sit > stand level at sink to demonstrate standing balance/tolerance.  Completed transfer training in bathroom with sister providing supervision/cues and husband observing as sister to be primary caregiver initially.  Pt completed all transfers and simulated toileting with demonstration of use of toilet aid with supervision.    Pt's husband initially concerned about pt progress and readiness for d/c home.  However pt end of session, pt's husband quite pleased with pt mobility and ability to complete stand pivot transfers and self-care tasks at supervision.  Therapy Documentation Precautions:   Precautions Precautions: Fall Precaution Comments: LLE TDWB Restrictions Weight Bearing Restrictions: Yes LLE Weight Bearing: Touchdown weight bearing Other Position/Activity Restrictions: TDWB LLE Pain:  Pt with no c/o pain   Therapy/Group: Individual Therapy  Simonne Come 04/19/2019, 8:48 AM

## 2019-04-20 ENCOUNTER — Other Ambulatory Visit: Payer: Self-pay

## 2019-04-20 ENCOUNTER — Ambulatory Visit: Payer: Medicare Other | Admitting: Family

## 2019-04-20 ENCOUNTER — Encounter: Payer: Self-pay | Admitting: Family

## 2019-04-20 ENCOUNTER — Ambulatory Visit (INDEPENDENT_AMBULATORY_CARE_PROVIDER_SITE_OTHER): Payer: Medicare Other | Admitting: Family

## 2019-04-20 VITALS — Ht 60.0 in | Wt 155.0 lb

## 2019-04-20 DIAGNOSIS — M86172 Other acute osteomyelitis, left ankle and foot: Secondary | ICD-10-CM

## 2019-04-20 LAB — GLUCOSE, CAPILLARY
Glucose-Capillary: 114 mg/dL — ABNORMAL HIGH (ref 70–99)
Glucose-Capillary: 115 mg/dL — ABNORMAL HIGH (ref 70–99)

## 2019-04-20 MED ORDER — INSULIN DETEMIR 100 UNIT/ML FLEXPEN: 8.0000 [IU] | PEN_INJECTOR | Freq: Every day | SUBCUTANEOUS | 11 refills | Status: DC

## 2019-04-20 NOTE — Progress Notes (Signed)
Patient D/C home with family member. A&O x4, dined pain at the time, All questions and concern addressed. She was transported to the lobby in wheelchair.

## 2019-04-20 NOTE — Progress Notes (Signed)
Office Visit Note   Patient: [name]           Date of Birth: 10-24-1945           MRN: [id number] Visit Date: 04/20/2019              Requested by: Unk Pinto, [address],  [place] [address] PCP: Unk Pinto, MD  Chief Complaint  Patient presents with  . Left Foot - Routine Post Op    04/06/2019 left foot 5th ray      HPI: Mild this is a pleasant 74 year old woman who is 2 weeks status post left fifth ray amputation.  She is doing well.  Assessment & Plan: Visit Diagnoses: No diagnosis found.  Plan: She will follow up in 1 week and at that time I believe we could remove her sutures.  She will continue to elevate her foot.  She may wash her foot with soap and water  Follow-Up Instructions: No follow-ups on file.   Ortho Exam  Patient is alert, oriented, no adenopathy, well-dressed, normal affect, normal respiratory effort. Left foot: Well-healing surgical incision mild to moderate amount of soft tissue swelling but no surrounding cellulitis wound edges are healthy sutures are all intact Imaging: No results found. No images are attached to the encounter.  Labs: Lab Results  Component Value Date   HGBA1C 6.8 (H) 03/23/2019   HGBA1C 6.3 (H) 01/10/2019   HGBA1C 5.4 03/22/2018   ESRSEDRATE 9 05/13/2018   ESRSEDRATE 96 (H) 02/18/2017   ESRSEDRATE 63 (H) 06/04/2013   CRP 3.0 05/13/2018   CRP 57.0 (H) 02/18/2017   LABURIC 3.6 01/10/2019   LABURIC 6.2 08/04/2017   LABURIC 5.3 07/01/2016   REPTSTATUS 03/28/2019 FINAL 03/23/2019   GRAMSTAIN  03/16/2019    NO WBC SEEN NO ORGANISMS SEEN Performed at [facility], [place] [address] [place], [place] [address]    CULT  03/23/2019    NO GROWTH 5 DAYS Performed at [facility] [address] [place], [place] [address]    LABORGA SERRATIA MARCESCENS 03/16/2019     Lab Results  Component Value Date   ALBUMIN 2.7 (L) 04/19/2019   ALBUMIN 2.5 (L) 04/18/2019   ALBUMIN 2.6 (L) 04/17/2019   LABURIC 3.6 01/10/2019   LABURIC 6.2 08/04/2017   LABURIC 5.3 07/01/2016    Lab Results  Component Value Date   MG 2.4 04/07/2019   MG 2.2 01/10/2019   MG 2.3 03/22/2018   Lab Results  Component Value Date   VD25OH 50 01/10/2019   VD25OH 66 03/22/2018   VD25OH 48 08/04/2017    No results found for: PREALBUMIN CBC EXTENDED Latest Ref Rng & Units 04/19/2019 04/18/2019 04/17/2019  WBC 4.0 - 10.5 K/uL 7.0 6.4 9.8  RBC 3.87 - 5.11 MIL/uL 2.95(L) 2.90(L) 3.03(L)  HGB 12.0 - 15.0 g/dL 9.1(L) 9.0(L) 9.4(L)  HCT 36.0 - 46.0 % 29.6(L) 29.0(L) 30.8(L)  PLT 150 - 400 K/uL 234 218 243  NEUTROABS 1.7 - 7.7 K/uL - 4.2 -  LYMPHSABS 0.7 - 4.0 K/uL - 1.2 -     Body mass index is 30.27 kg/m.  Orders:  No orders of the defined types were placed in this encounter.  No orders of the defined types were placed in this encounter.    Procedures: No procedures performed  Clinical Data: No additional findings.  ROS:  All other systems negative, except as noted in the HPI. Review of Systems  Objective:  Vital Signs: Ht 5' (1.524 m)   Wt 154 lb 15.7 oz (70.3 kg)   BMI 30.27 kg/m   Specialty Comments:  No specialty comments available.  PMFS History: Patient Active Problem List   Diagnosis Date Noted  . Neuropathic pain   . Labile blood glucose   . Full incontinence of feces   . Acute on chronic anemia   . Drug induced constipation   . Benign essential HTN   . Debility 04/07/2019  . 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 S/P femoral-popliteal bypass surgery 04/07/2019  . Chronic bilateral low back pain without sciatica   . Septic arthritis of left foot ([place])   . Cellulitis of left foot   . Abscess of right foot   . Pyogenic inflammation of bone ([place]) 03/23/2019  . Osteomyelitis of foot, left, acute ([place]) 03/23/2019  . Hyperlipidemia associated with type 2 diabetes mellitus ([place]) 01/09/2019  . Diabetes  mellitus due to underlying condition with chronic kidney disease on chronic dialysis, with long-term current use of insulin ([place]) 01/09/2019  . ESRD on hemodialysis ([place])   . Generalized weakness   . Mitral valve mass   . Coronary artery disease involving coronary bypass graft of native heart without angina pectoris   . Bradycardia   . Anemia 05/03/2017  . Chronic kidney disease (CKD), active medical management without dialysis, stage 5 ([place]) 05/08/2015  . Diabetes mellitus type 2, insulin dependent ([place]) 05/08/2015  . BMI 29.0-29.9,adult 02/14/2015  . Medication management 08/04/2014  . Type II diabetes mellitus with neurological manifestations, uncontrolled ([place]) 02/17/2014  . Gouty arthritis   . Vitamin D deficiency   . Malignant neoplasm of upper-inner quadrant of left breast in female, estrogen receptor positive ([place]) 02/14/2013  . PVD (peripheral vascular disease) ([place]) 09/05/2009  . Hyperlipidemia 06/20/2009  . Essential hypertension 06/20/2009  . Congestive heart failure ([place]) 06/20/2009  . EMPHYSEMA 06/20/2009  . Asthma 06/20/2009   Past Medical History:  Diagnosis Date  . Allergy   . Arthritis    "maybe in my lower back" (08/27/2012)  . 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 Chronic combined systolic and diastolic CHF (congestive heart failure) ([place])   . CKD (chronic kidney disease), stage IV ([place])   . Coronary artery disease    a. NSTEMI in 05/2009 s/p CABG (LIMA-LAD, SVG-diagonal, SVG-OM1/OM 2).   Marland Kitchen Dyspnea    with exertion  . Family history of anesthesia complication    Mom has a hard time to wake up  . Foot ulcer ([place])    "I've had them on both feet" (08/27/2012)  . Gouty arthritis    "right index finger" (08/27/2012)  . History of radiation therapy 06/09/13-07/06/13   left breast 50Gy  . Hyperlipidemia   . Hypertension   . Infection    right second toe  . Ischemic cardiomyopathy    a. 2011: EF 40-45%. b. EF  55-60% in 04/2017 but shortly after as inpatient was 45-50%.  Marland Kitchen LBBB (left bundle branch block)   . Mitral regurgitation   . Neuropathy    Hx; of B/L feet  . NSTEMI (non-ST elevated myocardial infarction) ([place]) 05/23/2009  . Osteomyelitis of foot ([place])   . PAD (peripheral artery disease) ([facility])    a. LE PAD (patient previously elected hold off L fem-pop), prev followed by Dr. Bridgett Larsson  . PONV (postoperative nausea and vomiting)   . Sinus headache    "  occasionally" (08/27/2012)  . Type II diabetes mellitus ([place])   . Vitamin D deficiency     Family History  Problem Relation Age of Onset  . Kidney cancer Brother 67  . Hypertension Brother   . Breast cancer Sister 109       LCIS; BRCA negative  . Throat cancer Father 34       smoker  . Breast cancer Sister 65       Lobular breast cancer  . Breast cancer Maternal Aunt        dx in her 32s    Past Surgical History:  Procedure Laterality Date  . ABDOMINAL AORTOGRAM W/LOWER EXTREMITY Bilateral 03/25/2019   Procedure: ABDOMINAL AORTOGRAM W/LOWER EXTREMITY;  Surgeon: Marty Heck, MD;  Location: [facility];  Service: Cardiovascular;  Laterality: Bilateral;  . AMPUTATION Right 08/27/2012   Procedure: AMPUTATION RAY;  Surgeon: Newt Minion, MD;  Location: [facility];  Service: Orthopedics;  Laterality: Right;  Right Foot 5th Ray Amputation  . AMPUTATION Left 07/08/2013   Procedure: AMPUTATION RAY;  Surgeon: Newt Minion, MD;  Location: [facility];  Service: Orthopedics;  Laterality: Left;  Left Foot 2nd Ray Amputation  . AMPUTATION Left 04/06/2019   Procedure: LEFT 5TH RAY AMPUTATION;  Surgeon: Newt Minion, MD;  Location: [facility];  Service: Orthopedics;  Laterality: Left;  . AMPUTATION RAY Right 08/27/2012   5th ray/notes 08/27/2012  . AMPUTATION TOE Right 03/06/2017   Procedure: AMPUTATION TOE, INTERPHANGEAL 2ND RIGHT;  Surgeon: [name], DPM;  Location: [facility];  Service: Podiatry;  Laterality: Right;  . AV FISTULA PLACEMENT Right  11/11/2017   Procedure: RIGHT RADIOCEPHALIC  ARTERIOVENOUS FISTULA;  Surgeon: [name], MD;  Location: [facility];  Service: Vascular;  Laterality: Right;  . BREAST LUMPECTOMY  04/20/2013   with biopsy      DR WAKEFIELD  . BREAST LUMPECTOMY WITH NEEDLE LOCALIZATION AND AXILLARY SENTINEL LYMPH NODE BX Left 04/20/2013   Procedure: LEFT BREAST WIRE GUIDED LUMPECTOMY AND AXILLARY SENTINEL LYMPH NODE BX;  Surgeon: Rolm Bookbinder, MD;  Location: [facility];  Service: General;  Laterality: Left;  . CARDIAC CATHETERIZATION  05/24/2009   Archie Endo 05/24/2009 (08/27/2012)  . CATARACT EXTRACTION W/ INTRAOCULAR LENS  IMPLANT, BILATERAL  2000's  . COLONOSCOPY W/ BIOPSIES AND POLYPECTOMY  2010  . CORONARY ARTERY BYPASS GRAFT  2011   "CABG X4" (08/27/2012)  . DENTAL SURGERY  04/30/11   "1 implant" (08/27/2012)  . DILATION AND CURETTAGE OF UTERUS    . ENDARTERECTOMY FEMORAL Left 03/28/2019   Procedure: ENDARTERECTOMY FEMORAL;  Surgeon: [name], MD;  Location: [facility];  Service: Vascular;  Laterality: Left;  . EYE SURGERY    . FEMORAL-POPLITEAL BYPASS GRAFT Left 03/28/2019   Procedure: BYPASS GRAFT FEMORAL-POPLITEAL ARTERY;  Surgeon: [name], MD;  Location: [facility];  Service: Vascular;  Laterality: Left;  . FINGER SURGERY Right    "index finger; turned out to be gout" (08/27/2012)  . IR FLUORO GUIDE CV LINE RIGHT  11/30/2017  . PATCH ANGIOPLASTY Left 03/28/2019   Procedure: Patch Angioplasty;  Surgeon: [name], MD;  Location: [facility];  Service: Vascular;  Laterality: Left;  . TEE WITHOUT CARDIOVERSION N/A 12/01/2017   Procedure: TRANSESOPHAGEAL ECHOCARDIOGRAM (TEE);  Surgeon: Lelon Perla, MD;  Location: [facility];  Service: Cardiovascular;  Laterality: N/A;   Social History   Occupational History  . Not on file  Tobacco Use  . Smoking status: Never Smoker  .  Smokeless tobacco: Never Used  Substance and Sexual Activity  . Alcohol use: Not Currently    Comment: rarely drinks wine  . Drug use:  No  . Sexual activity: Not Currently

## 2019-04-20 NOTE — Progress Notes (Signed)
[name], NT reported CBG 114. Finding did not cross over to Epic. No insulin coverage needed.

## 2019-04-20 NOTE — Consult Note (Signed)
   [facility] CM Inpatient Consult   04/20/2019  [facility] 1945-04-11 [id number]   Patient screened for extreme high risk score of 40%  for unplanned readmission in the [place] [[facility]] to assess for Care Management needs for transition.  Review of patient's medical record reveals patient is for home with home health, sister to assist, and HD set up.  Plan:  Follow up with inpatient CIR team member and no current needs assessed for [facility] Care Management.  Please place a [facility] Care Management consult as appropriate for any changes or needs and for questions contact:   [name], RN BSN [facility] Liaison  [phone] business mobile phone Toll free office [phone]  Fax number: [phone] [email or web address]

## 2019-04-20 NOTE — Progress Notes (Signed)
[place] PHYSICAL MEDICINE & REHABILITATION PROGRESS NOTE  Subjective/Complaints: Patient sitting up in bed this morning.  She states she slept well overnight.  She states she is ready for discharge.  ROS: Denies CP, SOB, N/V/D  Objective: Vital Signs: Blood pressure (!) 116/50, pulse 71, temperature 98.7 F (37.1 C), resp. rate 18, height 5' (1.524 m), weight 70.3 kg, SpO2 97 %. No results found. Recent Labs    04/18/19 0631 04/19/19 1430  WBC 6.4 7.0  HGB 9.0* 9.1*  HCT 29.0* 29.6*  PLT 218 234   Recent Labs    04/18/19 0631 04/19/19 1430  NA 136 133*  K 3.8 3.9  CL 95* 94*  CO2 29 24  GLUCOSE 109* 108*  BUN 41* 64*  CREATININE 5.41* 6.85*  CALCIUM 7.8* 7.8*    Physical Exam: BP (!) 116/50 (BP Location: Left Arm)   Pulse 71   Temp 98.7 F (37.1 C)   Resp 18   Ht 5' (1.524 m)   Wt 70.3 kg   SpO2 97%   BMI 30.27 kg/m  Constitutional: No distress . Vital signs reviewed. HENT: Normocephalic.  Atraumatic. Eyes: EOMI. No discharge. Cardiovascular: No JVD. Respiratory: Normal effort.  No stridor. GI: Non-distended. Skin: Left lower extremity with dressing C/D/I  Psych: Normal mood.  Normal behavior. Musc: Left metatarsal amputation with tenderness Neuro: Alert Motor: RLE: 5/5 proximal to distal, stable LLE: HF, KE 5/5, ADF 4/5, some pain inhibition), unchanged  Assessment/Plan: 1. Functional deficits secondary to debility which require 3+ hours per day of interdisciplinary therapy in a comprehensive inpatient rehab setting.  Physiatrist is providing close team supervision and 24 hour management of active medical problems listed below.  Physiatrist and rehab team continue to assess barriers to discharge/monitor patient progress toward functional and medical goals  Care Tool:  Bathing    Body parts bathed by patient: Right arm, Left arm, Chest, Abdomen, Front perineal area, Face, Right upper leg, Left upper leg, Right lower leg, Left lower leg,  Buttocks   Body parts bathed by helper: Buttocks     Bathing assist Assist Level: Supervision/Verbal cueing     Upper Body Dressing/Undressing Upper body dressing Upper body dressing/undressing activity did not occur (including orthotics): N/A What is the patient wearing?: Pull over shirt    Upper body assist Assist Level: Independent with assistive device    Lower Body Dressing/Undressing Lower body dressing    Lower body dressing activity did not occur: N/A What is the patient wearing?: Pants     Lower body assist Assist for lower body dressing: Supervision/Verbal cueing     Toileting Toileting Toileting Activity did not occur (Clothing management and hygiene only): Refused  Toileting assist Assist for toileting: Supervision/Verbal cueing     Transfers Chair/bed transfer  Transfers assist  Chair/bed transfer activity did not occur: Safety/medical concerns  Chair/bed transfer assist level: Supervision/Verbal cueing Chair/bed transfer assistive device: Walker, Clinical biochemist   Ambulation assist   Ambulation activity did not occur: Safety/medical concerns(LLE TDWB status, LE/UE weakness)  Assist level: Minimal Assistance - Patient > 75% Assistive device: Walker-rolling Max distance: 7ft   Walk 10 feet activity   Assist  Walk 10 feet activity did not occur: Safety/medical concerns(LLE TDWB status, LE/UE weakness)        Walk 50 feet activity   Assist Walk 50 feet with 2 turns activity did not occur: Safety/medical concerns(LLE TDWB status, LE/UE weakness)         Walk 150 feet  activity   Assist Walk 150 feet activity did not occur: Safety/medical concerns(LLE TDWB status, LE/UE weakness)         Walk 10 feet on uneven surface  activity   Assist Walk 10 feet on uneven surfaces activity did not occur: Safety/medical concerns(LLE TDWB status, LE/UE weakness)         Wheelchair     Assist Will patient use  wheelchair at discharge?: Yes Type of Wheelchair: Manual    Wheelchair assist level: Independent Max wheelchair distance: 153ft    Wheelchair 50 feet with 2 turns activity    Assist    Wheelchair 50 feet with 2 turns activity did not occur: (fatigue, increased R shoulder pain)   Assist Level: Independent   Wheelchair 150 feet activity     Assist Wheelchair 150 feet activity did not occur: (fatigue, increased R shoulder pain)   Assist Level: Independent      Medical Problem List and Plan: 1.  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  Touchdown weightbearing left lower extremity, just for balance  DC today  Will see patient for transitional care management in 1-2 weeks post-discharge 2.  Antithrombotics: -DVT/anticoagulation: ?None needed after recent peripheral vascular surgery             -antiplatelet therapy: Aspirin 81 mg daily 3. Pain Management: Flexeril and hydrocodone as needed  Continue gabapentin  Controlled on 1/13 with medications 4. Mood: Trazodone nightly.  Provide emotional support             -antipsychotic agents: N/A 5. Neuropsych: This patient is capable of making decisions on her own behalf. 6. Skin/Wound Care: Routine skin checks for incisions from fempop bypass and L 5th ray amputation  Continue dressing changes with gauze, kerlix, and ace wrap  Monitor for wound healing 7. Fluids/Electrolytes/Nutrition: Routine in and outs 8.  ESRD:  Continue hemodialysis as directed- T/H/S 9. Hypertension.  Hydralazine 25 mg every 8 hours, Isordil 10 mg 3 times daily.  Vitals:   04/19/19 1954 04/20/19 0437  BP: (!) 135/57 (!) 116/50  Pulse: 80 71  Resp: 18 18  Temp: 99 F (37.2 C) 98.7 F (37.1 C)  SpO2: 100% 97%     Slightly labile on 1/13  Monitor with increased mobility 10.  Hyperlipidemia.  Lipitor 11.  History of gout.  Continue Zyloprim twice daily.  Monitor for gout flareups. 12.  Diabetes mellitus type II with hyperglycemia.    Levemir 10 units daily decreased to 8  Check blood sugars before meals and at bedtime   CBG (last 3)  Recent Labs    04/19/19 1203 04/19/19 1750 04/19/19 2133  GLUCAP 112* 114* 104*   Relatively controlled on 1/13  Monitor with increased mobility 13. Osteomyelitis- Completed course of IV ABX  14.  Acute on chronic anemia- restart Iron per Nephro  Hemoglobin 9.1 on 1/12  Continue to monitor 15. Drug induced constipation  KUB personally reviewed, suggesting constipation  Bowel meds increased on 1/1, adjusted further 1/4, d/ced on 1/6  Will consider Fiber  Hemorrhoid cream for hemorrhoid Sx's  16. Leukocytosis: Resolved  WBC 7.3 on 1/4 17. Sciatic/piriformis syndrome  Continue gabapentin 100mg  BID - improved sciatic pain   -tennis ball  Lidocaine patch  See #3  Improved  LOS: 13 days A FACE  TO FACE EVALUATION WAS PERFORMED  Hammond Obeirne Lorie Phenix 04/20/2019, 9:38 AM

## 2019-04-21 ENCOUNTER — Telehealth: Payer: Self-pay

## 2019-04-21 ENCOUNTER — Telehealth: Payer: Self-pay | Admitting: Nephrology

## 2019-04-21 DIAGNOSIS — K5903 Drug induced constipation: Secondary | ICD-10-CM | POA: Diagnosis not present

## 2019-04-21 DIAGNOSIS — I447 Left bundle-branch block, unspecified: Secondary | ICD-10-CM | POA: Diagnosis not present

## 2019-04-21 DIAGNOSIS — Z95828 Presence of other vascular implants and grafts: Secondary | ICD-10-CM | POA: Diagnosis not present

## 2019-04-21 DIAGNOSIS — E559 Vitamin D deficiency, unspecified: Secondary | ICD-10-CM | POA: Diagnosis not present

## 2019-04-21 DIAGNOSIS — D509 Iron deficiency anemia, unspecified: Secondary | ICD-10-CM | POA: Diagnosis not present

## 2019-04-21 DIAGNOSIS — N2581 Secondary hyperparathyroidism of renal origin: Secondary | ICD-10-CM | POA: Diagnosis not present

## 2019-04-21 DIAGNOSIS — Z4801 Encounter for change or removal of surgical wound dressing: Secondary | ICD-10-CM | POA: Diagnosis not present

## 2019-04-21 DIAGNOSIS — I252 Old myocardial infarction: Secondary | ICD-10-CM | POA: Diagnosis not present

## 2019-04-21 DIAGNOSIS — Z87891 Personal history of nicotine dependence: Secondary | ICD-10-CM | POA: Diagnosis not present

## 2019-04-21 DIAGNOSIS — Z4800 Encounter for change or removal of nonsurgical wound dressing: Secondary | ICD-10-CM | POA: Diagnosis not present

## 2019-04-21 DIAGNOSIS — Z951 Presence of aortocoronary bypass graft: Secondary | ICD-10-CM | POA: Diagnosis not present

## 2019-04-21 DIAGNOSIS — Z4781 Encounter for orthopedic aftercare following surgical amputation: Secondary | ICD-10-CM | POA: Diagnosis not present

## 2019-04-21 DIAGNOSIS — D631 Anemia in chronic kidney disease: Secondary | ICD-10-CM | POA: Diagnosis not present

## 2019-04-21 DIAGNOSIS — E1165 Type 2 diabetes mellitus with hyperglycemia: Secondary | ICD-10-CM | POA: Diagnosis not present

## 2019-04-21 DIAGNOSIS — S80821D Blister (nonthermal), right lower leg, subsequent encounter: Secondary | ICD-10-CM | POA: Diagnosis not present

## 2019-04-21 DIAGNOSIS — E1122 Type 2 diabetes mellitus with diabetic chronic kidney disease: Secondary | ICD-10-CM | POA: Diagnosis not present

## 2019-04-21 DIAGNOSIS — E785 Hyperlipidemia, unspecified: Secondary | ICD-10-CM | POA: Diagnosis not present

## 2019-04-21 DIAGNOSIS — I051 Rheumatic mitral insufficiency: Secondary | ICD-10-CM | POA: Diagnosis not present

## 2019-04-21 DIAGNOSIS — M1990 Unspecified osteoarthritis, unspecified site: Secondary | ICD-10-CM | POA: Diagnosis not present

## 2019-04-21 DIAGNOSIS — I132 Hypertensive heart and chronic kidney disease with heart failure and with stage 5 chronic kidney disease, or end stage renal disease: Secondary | ICD-10-CM | POA: Diagnosis not present

## 2019-04-21 DIAGNOSIS — E1142 Type 2 diabetes mellitus with diabetic polyneuropathy: Secondary | ICD-10-CM | POA: Diagnosis not present

## 2019-04-21 DIAGNOSIS — N186 End stage renal disease: Secondary | ICD-10-CM | POA: Diagnosis not present

## 2019-04-21 DIAGNOSIS — M869 Osteomyelitis, unspecified: Secondary | ICD-10-CM | POA: Diagnosis not present

## 2019-04-21 DIAGNOSIS — M109 Gout, unspecified: Secondary | ICD-10-CM | POA: Diagnosis not present

## 2019-04-21 DIAGNOSIS — Z992 Dependence on renal dialysis: Secondary | ICD-10-CM | POA: Diagnosis not present

## 2019-04-21 DIAGNOSIS — I2510 Atherosclerotic heart disease of native coronary artery without angina pectoris: Secondary | ICD-10-CM | POA: Diagnosis not present

## 2019-04-21 DIAGNOSIS — E1151 Type 2 diabetes mellitus with diabetic peripheral angiopathy without gangrene: Secondary | ICD-10-CM | POA: Diagnosis not present

## 2019-04-21 DIAGNOSIS — E119 Type 2 diabetes mellitus without complications: Secondary | ICD-10-CM | POA: Diagnosis not present

## 2019-04-21 DIAGNOSIS — I5042 Chronic combined systolic (congestive) and diastolic (congestive) heart failure: Secondary | ICD-10-CM | POA: Diagnosis not present

## 2019-04-21 NOTE — Telephone Encounter (Signed)
Transition of care contact from inpatient facility  Date of discharge: 04/20/2019  Date of contact: 04/21/2019 Method: Phone Spoke to: Patient  Patient contacted to discuss transition of care from recent inpatient hospitalization. Patient was admitted to [facility] from 04/06/20 to 04/20/19 with discharge diagnosis of debility s/p L fem endarterectomy followed by L 5th ray amputation.  Medication changes were reviewed. Her husband was able to pick up her new medications  Patient will follow up with his/her outpatient HD unit on: Today - for HD this afternoon.  Other f/u needs include: Home health arranged - thinks she may need some different equipment and plans to ask them about it today when they come. Disc if they are unable to resolve, then to ask the SW at her home HD unit to assist.

## 2019-04-21 NOTE — Telephone Encounter (Signed)
Transitional Care call--Patient    1. Are you/is patient experiencing any problems since coming home? No Are there any questions regarding any aspect of care? No 2. Are there any questions regarding medications administration/dosing? No Are meds being taken as prescribed? Yes Patient should review meds with caller to confirm 3. Have there been any falls? Stubble  4. Has Home Health been to the house and/or have they contacted you? Yes  If not, have you tried to contact them?  Can we help you contact them? 5. Are bowels and bladder emptying properly?Yes Are there any unexpected incontinence issues? No, goes to dialysis  If applicable, is patient following bowel/bladder programs? 6. Any fevers, problems with breathing, unexpected pain? No 7. Are there any skin problems or new areas of breakdown? No 8. Has the patient/family member arranged specialty MD follow up (ie cardiology/neurology/renal/surgical/etc)? Yes  Can we help arrange? 9. Does the patient need any other services or support that we can help arrange? No 10. Are caregivers following through as expected in assisting the patient? Yes 11. Has the patient quit smoking, drinking alcohol, or using drugs as recommended?  Yes  Appointment time 1:40, arrive time 1:20 with Dr. Ranell Patrick on 05/04/2019 [address]

## 2019-04-22 ENCOUNTER — Telehealth: Payer: Self-pay | Admitting: *Deleted

## 2019-04-22 DIAGNOSIS — Z4781 Encounter for orthopedic aftercare following surgical amputation: Secondary | ICD-10-CM | POA: Diagnosis not present

## 2019-04-22 DIAGNOSIS — I2510 Atherosclerotic heart disease of native coronary artery without angina pectoris: Secondary | ICD-10-CM | POA: Diagnosis not present

## 2019-04-22 DIAGNOSIS — I132 Hypertensive heart and chronic kidney disease with heart failure and with stage 5 chronic kidney disease, or end stage renal disease: Secondary | ICD-10-CM | POA: Diagnosis not present

## 2019-04-22 DIAGNOSIS — I5042 Chronic combined systolic (congestive) and diastolic (congestive) heart failure: Secondary | ICD-10-CM | POA: Diagnosis not present

## 2019-04-22 DIAGNOSIS — I252 Old myocardial infarction: Secondary | ICD-10-CM | POA: Diagnosis not present

## 2019-04-22 DIAGNOSIS — E1122 Type 2 diabetes mellitus with diabetic chronic kidney disease: Secondary | ICD-10-CM | POA: Diagnosis not present

## 2019-04-22 NOTE — Telephone Encounter (Signed)
Called patient on 04/22/2019 , 11:29 AM in an attempt to reach the patient for a hospital follow up.   Admit date: 04/07/19 Discharge: 04/20/19   She does have any questions or concerns about medications from the hospital admission. The patient's medications were reviewed over the phone, they were counseled to bring in all current medications to the hospital follow up visit. She stopped Levemir when he returned home. She is having low glucose reading In the 70's at times.If her reading is below 70, she starts having symptoms of clamminess.  She currently is taking 10 to 20 units at breakfast and 20 units at supper.  Per Dr Melford Aase, she should continue the morning dose daily, but can reduce the dose by 2 to 3 units, if her glucose is in the 70's.  I advised the patient to call if any questions or concerns arise about the hospital admission or medications    Home health was not started in the hospital. She was in the in house rehabilitation  at [facility] prior to discharge and will do out patient rehabilitation when she can be weight bearing on her left foot. All questions were answered and a follow up appointment was made. Patient has an appointment on 05/04/2019 with Rayford Halsted, which is a telephone visit.  Prior to Admission medications   Medication Sig Start Date End Date Taking? Authorizing Provider  acetaminophen (TYLENOL) 325 MG tablet Take 1-2 tablets (325-650 mg total) by mouth every 4 (four) hours as needed for mild pain (or temp >/= 101 F). 04/19/19   Angiulli, Lavon Paganini, PA-C  allopurinol (ZYLOPRIM) 100 MG tablet Take 1 tablet 2 x /day to Prevent Gout 04/19/19   Angiulli, Lavon Paganini, PA-C  aspirin EC 81 MG tablet Take 1 tablet (81 mg total) by mouth daily. 05/18/17   Burtis Junes, NP  atorvastatin (LIPITOR) 10 MG tablet Take 1 tablet (10 mg total) by mouth daily at 6 PM. 04/19/19   Angiulli, Lavon Paganini, PA-C  b complex-vitamin c-folic acid (NEPHRO-VITE) 0.8 MG TABS tablet Take 1 tablet by  mouth 3 (three) times a week. Tuesday, Thursday, Saturday 03/19/18   [provider]  cinacalcet (SENSIPAR) 30 MG tablet Take 1 tablet (30 mg total) by mouth daily with supper. 04/19/19   Angiulli, Lavon Paganini, PA-C  cyclobenzaprine (FLEXERIL) 5 MG tablet Take 1 tablet (5 mg total) by mouth 3 (three) times daily as needed for muscle spasms. 04/19/19   Angiulli, Lavon Paganini, PA-C  Darbepoetin Alfa (ARANESP) 100 MCG/0.5ML SOSY injection Inject 0.5 mLs (100 mcg total) into the vein every Tuesday with hemodialysis. 04/19/19   Angiulli, Lavon Paganini, PA-C  docusate sodium (COLACE) 100 MG capsule Take 100 mg by mouth at bedtime.    [provider]  doxercalciferol (HECTOROL) 4 MCG/2ML injection Inject 2 mLs (4 mcg total) into the vein Every Tuesday,Thursday,and Saturday with dialysis. 04/19/19   Angiulli, Lavon Paganini, PA-C  ferric gluconate 125 mg in sodium chloride 0.9 % 100 mL Inject 125 mg into the vein Every Tuesday,Thursday,and Saturday with dialysis. 04/19/19   Angiulli, Lavon Paganini, PA-C  gabapentin (NEURONTIN) 100 MG capsule Take 1 capsule (100 mg total) by mouth 2 (two) times daily. 04/19/19   Angiulli, Lavon Paganini, PA-C  hydrALAZINE (APRESOLINE) 25 MG tablet Take 1 tablet (25 mg total) by mouth every 8 (eight) hours. 04/19/19   Angiulli, Lavon Paganini, PA-C  HYDROcodone-acetaminophen (NORCO/VICODIN) 5-325 MG tablet Take 0.5-1 tablets by mouth every 6 (six) hours as needed for severe pain.  04/19/19   Angiulli, Lavon Paganini, PA-C  Insulin Detemir (LEVEMIR) 100 UNIT/ML Pen Inject 8 Units into the skin daily. 04/20/19   Angiulli, Lavon Paganini, PA-C  isosorbide dinitrate (ISORDIL) 10 MG tablet Take 1 tablet 3 x /day for BP & Heart 04/19/19   Angiulli, Lavon Paganini, PA-C  lidocaine (LIDODERM) 5 % Place 1 patch onto the skin daily. Remove & Discard patch within 12 hours or as directed by MD 04/19/19   Angiulli, Lavon Paganini, PA-C  Multiple Vitamins-Minerals (PRESERVISION AREDS 2 PO) Take 2 capsules by mouth daily.    [provider]  pantoprazole (PROTONIX) 40 MG tablet Take 1 tablet (40 mg total) by mouth daily. 04/19/19   Angiulli, Lavon Paganini, PA-C  polyethylene glycol (MIRALAX / GLYCOLAX) 17 g packet Take 17 g by mouth daily. 04/08/19   Pokhrel, Corrie Mckusick, MD  Probiotic Product (PROBIOTIC DAILY PO) Take 1 tablet by mouth daily.    [provider]  sevelamer carbonate (RENVELA) 800 MG tablet Take 2 tablets (1,600 mg total) by mouth 3 (three) times daily with meals. 04/19/19   Angiulli, Lavon Paganini, PA-C  traZODone (DESYREL) 50 MG tablet 1/2-2 tablets 1 hour prior to bedtime for sleep. 04/19/19   Angiulli, Lavon Paganini, PA-C

## 2019-04-23 DIAGNOSIS — N2581 Secondary hyperparathyroidism of renal origin: Secondary | ICD-10-CM | POA: Diagnosis not present

## 2019-04-23 DIAGNOSIS — N186 End stage renal disease: Secondary | ICD-10-CM | POA: Diagnosis not present

## 2019-04-23 DIAGNOSIS — Z992 Dependence on renal dialysis: Secondary | ICD-10-CM | POA: Diagnosis not present

## 2019-04-23 DIAGNOSIS — D509 Iron deficiency anemia, unspecified: Secondary | ICD-10-CM | POA: Diagnosis not present

## 2019-04-23 DIAGNOSIS — E119 Type 2 diabetes mellitus without complications: Secondary | ICD-10-CM | POA: Diagnosis not present

## 2019-04-23 DIAGNOSIS — D631 Anemia in chronic kidney disease: Secondary | ICD-10-CM | POA: Diagnosis not present

## 2019-04-25 ENCOUNTER — Telehealth (HOSPITAL_COMMUNITY): Payer: Self-pay

## 2019-04-25 ENCOUNTER — Telehealth: Payer: Self-pay | Admitting: *Deleted

## 2019-04-25 ENCOUNTER — Telehealth: Payer: Self-pay

## 2019-04-25 NOTE — Telephone Encounter (Signed)
[name], PT from [facility] called requesting verbal orders for HHPT 1wk1, 2wk5. Orders approved and given. Also informed that Dr. Posey Pronto will sign off on orders not Dr.Swartz.

## 2019-04-25 NOTE — Telephone Encounter (Signed)
Patient states she was instructed by on call doctor to sched. appt for wound/ Left leg check ASAP. She had increase in redness and swelling over the week end. She denies fever, chills or any drainage. She states leg has improved since elevating, but remains"warm and pink. Appt given for 04/26/19(first available) instructed to continue as instructed by Dr. Carlis Abbott and go to [facility] for acute or worsening condition.

## 2019-04-25 NOTE — Telephone Encounter (Signed)

## 2019-04-26 ENCOUNTER — Ambulatory Visit (INDEPENDENT_AMBULATORY_CARE_PROVIDER_SITE_OTHER): Payer: Self-pay | Admitting: Physician Assistant

## 2019-04-26 ENCOUNTER — Other Ambulatory Visit: Payer: Self-pay

## 2019-04-26 VITALS — BP 144/73 | HR 97 | Temp 98.0°F | Resp 16 | Ht 63.0 in | Wt 150.0 lb

## 2019-04-26 DIAGNOSIS — N186 End stage renal disease: Secondary | ICD-10-CM | POA: Diagnosis not present

## 2019-04-26 DIAGNOSIS — Z992 Dependence on renal dialysis: Secondary | ICD-10-CM | POA: Diagnosis not present

## 2019-04-26 DIAGNOSIS — Z95828 Presence of other vascular implants and grafts: Secondary | ICD-10-CM

## 2019-04-26 DIAGNOSIS — N2581 Secondary hyperparathyroidism of renal origin: Secondary | ICD-10-CM | POA: Diagnosis not present

## 2019-04-26 DIAGNOSIS — E119 Type 2 diabetes mellitus without complications: Secondary | ICD-10-CM | POA: Diagnosis not present

## 2019-04-26 DIAGNOSIS — D509 Iron deficiency anemia, unspecified: Secondary | ICD-10-CM | POA: Diagnosis not present

## 2019-04-26 DIAGNOSIS — M86172 Other acute osteomyelitis, left ankle and foot: Secondary | ICD-10-CM | POA: Diagnosis not present

## 2019-04-26 DIAGNOSIS — D631 Anemia in chronic kidney disease: Secondary | ICD-10-CM | POA: Diagnosis not present

## 2019-04-26 DIAGNOSIS — R54 Age-related physical debility: Secondary | ICD-10-CM | POA: Diagnosis not present

## 2019-04-26 MED ORDER — CEPHALEXIN 250 MG PO CAPS: 250.0000 mg | ORAL_CAPSULE | Freq: Two times a day (BID) | ORAL | 0 refills | Status: DC

## 2019-04-26 NOTE — Progress Notes (Signed)
POST OPERATIVE OFFICE NOTE    CC:  F/u for surgery  HPI:  This is a 74 y.o. female who is s/p #1 left common femoral endarterectomy and Dacron patch angioplasty, #2 left femoral to below-knee popliteal bypass with nonreversed translocated great saphenous vein on 03/28/2019 by Dr. Donnetta Hutching.  She returns today with complaints of redness on her lower left leg.  She states she went home from CIR last Wednesday and on Friday she started to notice some redness on her left leg.  She states she talked to Dr. Carlis Abbott and he recommended elevation and f/u in our office.  She states elevating her leg has helped with the tightness and the swelling has improved with elevation.  She has not had any fevers or feeling bad.  She has appt with Dr. Donnetta Hutching next Tuesday.    She has ESRD on HD.    Allergies  Allergen Reactions  . Atenolol Other (See Comments) and Rash    Exacerbates gout Exacerbates gout  . Adhesive [Tape] Other (See Comments)    SKIN IS VERY SENSITIVE AND BRUISES AND TEARS EASILY; PLEASE USE AN ALTERNATIVE TO TAPE!!  . Contrast Media [Iodinated Diagnostic Agents] Rash  . Iohexol Rash and Other (See Comments)  . Latex Rash    Current Outpatient Medications  Medication Sig Dispense Refill  . acetaminophen (TYLENOL) 325 MG tablet Take 1-2 tablets (325-650 mg total) by mouth every 4 (four) hours as needed for mild pain (or temp >/= 101 F).    Marland Kitchen allopurinol (ZYLOPRIM) 100 MG tablet Take 1 tablet 2 x /day to Prevent Gout 180 tablet 3  . aspirin EC 81 MG tablet Take 1 tablet (81 mg total) by mouth daily.    Marland Kitchen atorvastatin (LIPITOR) 10 MG tablet Take 1 tablet (10 mg total) by mouth daily at 6 PM. 30 tablet 0  . b complex-vitamin c-folic acid (NEPHRO-VITE) 0.8 MG TABS tablet Take 1 tablet by mouth 3 (three) times a week. Tuesday, Thursday, Saturday    . cinacalcet (SENSIPAR) 30 MG tablet Take 1 tablet (30 mg total) by mouth daily with supper. 60 tablet 1  . cyclobenzaprine (FLEXERIL) 5 MG tablet Take 1  tablet (5 mg total) by mouth 3 (three) times daily as needed for muscle spasms. 60 tablet 0  . Darbepoetin Alfa (ARANESP) 100 MCG/0.5ML SOSY injection Inject 0.5 mLs (100 mcg total) into the vein every Tuesday with hemodialysis. 4.2 mL   . docusate sodium (COLACE) 100 MG capsule Take 100 mg by mouth at bedtime.    Marland Kitchen doxercalciferol (HECTOROL) 4 MCG/2ML injection Inject 2 mLs (4 mcg total) into the vein Every Tuesday,Thursday,and Saturday with dialysis. 2 mL   . ferric gluconate 125 mg in sodium chloride 0.9 % 100 mL Inject 125 mg into the vein Every Tuesday,Thursday,and Saturday with dialysis.    Marland Kitchen gabapentin (NEURONTIN) 100 MG capsule Take 1 capsule (100 mg total) by mouth 2 (two) times daily. 60 capsule 1  . hydrALAZINE (APRESOLINE) 25 MG tablet Take 1 tablet (25 mg total) by mouth every 8 (eight) hours. 90 tablet 1  . HYDROcodone-acetaminophen (NORCO/VICODIN) 5-325 MG tablet Take 0.5-1 tablets by mouth every 6 (six) hours as needed for severe pain. 30 tablet 0  . Insulin Detemir (LEVEMIR) 100 UNIT/ML Pen Inject 8 Units into the skin daily. 15 mL 11  . isosorbide dinitrate (ISORDIL) 10 MG tablet Take 1 tablet 3 x /day for BP & Heart 270 tablet 3  . lidocaine (LIDODERM) 5 % Place 1 patch  onto the skin daily. Remove & Discard patch within 12 hours or as directed by MD 30 patch 0  . Multiple Vitamins-Minerals (PRESERVISION AREDS 2 PO) Take 2 capsules by mouth daily.    . pantoprazole (PROTONIX) 40 MG tablet Take 1 tablet (40 mg total) by mouth daily. 30 tablet 0  . polyethylene glycol (MIRALAX / GLYCOLAX) 17 g packet Take 17 g by mouth daily.    . Probiotic Product (PROBIOTIC DAILY PO) Take 1 tablet by mouth daily.    . sevelamer carbonate (RENVELA) 800 MG tablet Take 2 tablets (1,600 mg total) by mouth 3 (three) times daily with meals. 90 tablet 1  . traZODone (DESYREL) 50 MG tablet 1/2-2 tablets 1 hour prior to bedtime for sleep. 60 tablet 2   No current facility-administered medications for this  visit.     ROS:  See HPI  Physical Exam:  Today's Vitals   04/26/19 0909  BP: (!) 144/73  Pulse: 97  Resp: 16  Temp: 98 F (36.7 C)  TempSrc: Temporal  SpO2: 97%  Weight: 150 lb (68 kg)  Height: 5\' 3"  (1.6 m)   Body mass index is 26.57 kg/m.   Incision:  All incisions look good.  She does have a bandage on her left foot and Dr. Sharol Given is following this.  Extremities:  Brisk doppler signals left DP/AT; some erythema on the anterior portion of leg below the knee.        Assessment/Plan:  This is a 74 y.o. female who is s/p: #1 left common femoral endarterectomy and Dacron patch angioplasty, #2 left femoral to below-knee popliteal bypass with nonreversed translocated great saphenous vein  -pt doing well and bypass is patent with brisk doppler signals left DP/AT.   -she does have some erythema on the anterior portion of her leg.  She has not had any fevers.  Will prescribe Keflex 250mg  bid x 7 days (reduced dose for renal pt).   -she will continue leg elevation for leg swelling, which is expected after lower extremity bypass -keep appt with Dr. Donnetta Hutching next Tuesday.     Leontine Locket, PA-C [facility] [phone]  Clinic MD:  Early

## 2019-04-27 ENCOUNTER — Ambulatory Visit (INDEPENDENT_AMBULATORY_CARE_PROVIDER_SITE_OTHER): Payer: Medicare Other | Admitting: Family

## 2019-04-27 ENCOUNTER — Encounter: Payer: Self-pay | Admitting: Family

## 2019-04-27 VITALS — Ht 63.0 in | Wt 150.0 lb

## 2019-04-27 DIAGNOSIS — I5042 Chronic combined systolic (congestive) and diastolic (congestive) heart failure: Secondary | ICD-10-CM | POA: Diagnosis not present

## 2019-04-27 DIAGNOSIS — I2510 Atherosclerotic heart disease of native coronary artery without angina pectoris: Secondary | ICD-10-CM | POA: Diagnosis not present

## 2019-04-27 DIAGNOSIS — I132 Hypertensive heart and chronic kidney disease with heart failure and with stage 5 chronic kidney disease, or end stage renal disease: Secondary | ICD-10-CM | POA: Diagnosis not present

## 2019-04-27 DIAGNOSIS — Z89422 Acquired absence of other left toe(s): Secondary | ICD-10-CM

## 2019-04-27 DIAGNOSIS — B351 Tinea unguium: Secondary | ICD-10-CM

## 2019-04-27 DIAGNOSIS — I252 Old myocardial infarction: Secondary | ICD-10-CM | POA: Diagnosis not present

## 2019-04-27 DIAGNOSIS — Z4781 Encounter for orthopedic aftercare following surgical amputation: Secondary | ICD-10-CM | POA: Diagnosis not present

## 2019-04-27 DIAGNOSIS — E1122 Type 2 diabetes mellitus with diabetic chronic kidney disease: Secondary | ICD-10-CM | POA: Diagnosis not present

## 2019-04-27 NOTE — Progress Notes (Signed)
Post-Op Visit Note   Patient: [name]           Date of Birth: [birth year]           MRN: [id number] Visit Date: 04/27/2019 PCP: Unk Pinto, MD  Chief Complaint:  Chief Complaint  Patient presents with  . Left Foot - Routine Post Op    04/06/2019 left foot 5th ray    HPI:  HPI The patient is a 74 year old woman seen today status post left fifth ray amputation December 30.  She is curious when she be can begin weightbearing of the left foot.  She also states she has been unable to follow-up with the foot center and is requesting a nail trim bilaterally. Ortho Exam On examination of her left foot the left fifth ray amputation is well-healed sutures were harvested there is scant bloody drainage there is no gaping no erythema no swelling no sign of infection she does have thickened and discolored onychomycotic nails x6 these were trimmed today without incident as she is unable to safely trim her nails  Visit Diagnoses:  1. 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 Onychomycosis     Plan: She will follow up with [name] as needed.  States she plans to follow with the wound center for bilateral leg ulcerations and her onychomycosis.  Follow-Up Instructions: Return in about 2 weeks (around 05/11/2019), or if symptoms worsen or fail to improve.   Imaging: No results found.  Orders:  No orders of the defined types were placed in this encounter.  No orders of the defined types were placed in this encounter.    PMFS History: Patient Active Problem List   Diagnosis Date Noted  . Neuropathic pain   . Labile blood glucose   . Full incontinence of feces   . Acute on chronic anemia   . Drug induced constipation   . Benign essential HTN   . Debility 04/07/2019  . 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 S/P femoral-popliteal bypass surgery 04/07/2019  . Chronic bilateral low back pain without sciatica     . Septic arthritis of left foot ([place])   . Cellulitis of left foot   . Abscess of right foot   . Pyogenic inflammation of bone ([place]) 03/23/2019  . Osteomyelitis of foot, left, acute ([place]) 03/23/2019  . Hyperlipidemia associated with type 2 diabetes mellitus ([place]) 01/09/2019  . Diabetes mellitus due to underlying condition with chronic kidney disease on chronic dialysis, with long-term current use of insulin ([place]) 01/09/2019  . ESRD on hemodialysis ([place])   . Generalized weakness   . Mitral valve mass   . Coronary artery disease involving coronary bypass graft of native heart without angina pectoris   . Bradycardia   . Anemia 05/03/2017  . Chronic kidney disease (CKD), active medical management without dialysis, stage 5 ([place]) 05/08/2015  . Diabetes mellitus type 2, insulin dependent ([place]) 05/08/2015  . BMI 29.0-29.9,adult 02/14/2015  . Medication management 08/04/2014  . Type II diabetes mellitus with neurological manifestations, uncontrolled ([place]) 02/17/2014  . Gouty arthritis   . Vitamin D deficiency   . Malignant neoplasm of upper-inner quadrant of left breast in female, estrogen receptor positive ([place]) 02/14/2013  . PVD (peripheral vascular disease) ([place]) 09/05/2009  . Hyperlipidemia 06/20/2009  . Essential hypertension 06/20/2009  . Congestive heart failure ([place]) 06/20/2009  . EMPHYSEMA 06/20/2009  . Asthma 06/20/2009  Past Medical History:  Diagnosis Date  . Allergy   . Arthritis    "maybe in my lower back" (08/27/2012)  . 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 Chronic combined systolic and diastolic CHF (congestive heart failure) ([place])   . CKD (chronic kidney disease), stage IV ([place])   . Coronary artery disease    a. NSTEMI in 05/2009 s/p CABG (LIMA-LAD, SVG-diagonal, SVG-OM1/OM 2).   Marland Kitchen Dyspnea    with exertion  . Family history of anesthesia complication    Mom has a hard time to wake up  . Foot ulcer ([place])    "I've  had them on both feet" (08/27/2012)  . Gouty arthritis    "right index finger" (08/27/2012)  . History of radiation therapy 06/09/13-07/06/13   left breast 50Gy  . Hyperlipidemia   . Hypertension   . Infection    right second toe  . Ischemic cardiomyopathy    a. 2011: EF 40-45%. b. EF 55-60% in 04/2017 but shortly after as inpatient was 45-50%.  Marland Kitchen LBBB (left bundle branch block)   . Mitral regurgitation   . Neuropathy    Hx; of B/L feet  . NSTEMI (non-ST elevated myocardial infarction) ([place]) 05/23/2009  . Osteomyelitis of foot ([place])   . PAD (peripheral artery disease) ([facility])    a. LE PAD (patient previously elected hold off L fem-pop), prev followed by Dr. Bridgett Larsson  . PONV (postoperative nausea and vomiting)   . Sinus headache    "occasionally" (08/27/2012)  . Type II diabetes mellitus ([place])   . Vitamin D deficiency     Family History  Problem Relation Age of Onset  . Kidney cancer Brother 44  . Hypertension Brother   . Breast cancer Sister 28       LCIS; BRCA negative  . Throat cancer Father 110       smoker  . Breast cancer Sister 48       Lobular breast cancer  . Breast cancer Maternal Aunt        dx in her 40s    Past Surgical History:  Procedure Laterality Date  . ABDOMINAL AORTOGRAM W/LOWER EXTREMITY Bilateral 03/25/2019   Procedure: ABDOMINAL AORTOGRAM W/LOWER EXTREMITY;  Surgeon: Marty Heck, MD;  Location: [facility];  Service: Cardiovascular;  Laterality: Bilateral;  . AMPUTATION Right 08/27/2012   Procedure: AMPUTATION RAY;  Surgeon: Newt Minion, MD;  Location: [facility];  Service: Orthopedics;  Laterality: Right;  Right Foot 5th Ray Amputation  . AMPUTATION Left 07/08/2013   Procedure: AMPUTATION RAY;  Surgeon: Newt Minion, MD;  Location: [facility];  Service: Orthopedics;  Laterality: Left;  Left Foot 2nd Ray Amputation  . AMPUTATION Left 04/06/2019   Procedure: LEFT 5TH RAY AMPUTATION;  Surgeon: Newt Minion, MD;  Location: [facility];  Service: Orthopedics;   Laterality: Left;  . AMPUTATION RAY Right 08/27/2012   5th ray/notes 08/27/2012  . AMPUTATION TOE Right 03/06/2017   Procedure: AMPUTATION TOE, INTERPHANGEAL 2ND RIGHT;  Surgeon: [name], DPM;  Location: [facility];  Service: Podiatry;  Laterality: Right;  . AV FISTULA PLACEMENT Right 11/11/2017   Procedure: RIGHT RADIOCEPHALIC  ARTERIOVENOUS FISTULA;  Surgeon: [name], MD;  Location: [facility];  Service: Vascular;  Laterality: Right;  . BREAST LUMPECTOMY  04/20/2013   with biopsy      DR WAKEFIELD  . BREAST LUMPECTOMY WITH NEEDLE LOCALIZATION AND AXILLARY SENTINEL LYMPH NODE BX Left 04/20/2013  Procedure: LEFT BREAST WIRE GUIDED LUMPECTOMY AND AXILLARY SENTINEL LYMPH NODE BX;  Surgeon: Rolm Bookbinder, MD;  Location: [facility];  Service: General;  Laterality: Left;  . CARDIAC CATHETERIZATION  05/24/2009   Archie Endo 05/24/2009 (08/27/2012)  . CATARACT EXTRACTION W/ INTRAOCULAR LENS  IMPLANT, BILATERAL  2000's  . COLONOSCOPY W/ BIOPSIES AND POLYPECTOMY  2010  . CORONARY ARTERY BYPASS GRAFT  2011   "CABG X4" (08/27/2012)  . DENTAL SURGERY  04/30/11   "1 implant" (08/27/2012)  . DILATION AND CURETTAGE OF UTERUS    . ENDARTERECTOMY FEMORAL Left 03/28/2019   Procedure: ENDARTERECTOMY FEMORAL;  Surgeon: [name], MD;  Location: [facility];  Service: Vascular;  Laterality: Left;  . EYE SURGERY    . FEMORAL-POPLITEAL BYPASS GRAFT Left 03/28/2019   Procedure: BYPASS GRAFT FEMORAL-POPLITEAL ARTERY;  Surgeon: [name], MD;  Location: [facility];  Service: Vascular;  Laterality: Left;  . FINGER SURGERY Right    "index finger; turned out to be gout" (08/27/2012)  . IR FLUORO GUIDE CV LINE RIGHT  11/30/2017  . PATCH ANGIOPLASTY Left 03/28/2019   Procedure: Patch Angioplasty;  Surgeon: [name], MD;  Location: [facility];  Service: Vascular;  Laterality: Left;  . TEE WITHOUT CARDIOVERSION N/A 12/01/2017   Procedure: TRANSESOPHAGEAL ECHOCARDIOGRAM (TEE);  Surgeon: Lelon Perla, MD;  [facility];   Service: Cardiovascular;  Laterality: N/A;   Social History   Occupational History  . Not on file  Tobacco Use  . Smoking status: Never Smoker  . Smokeless tobacco: Never Used  Substance and Sexual Activity  . Alcohol use: Not Currently    Comment: rarely drinks wine  . Drug use: No  . Sexual activity: Not Currently

## 2019-04-28 ENCOUNTER — Telehealth: Payer: Self-pay | Admitting: Orthopedic Surgery

## 2019-04-28 DIAGNOSIS — D631 Anemia in chronic kidney disease: Secondary | ICD-10-CM | POA: Diagnosis not present

## 2019-04-28 DIAGNOSIS — D509 Iron deficiency anemia, unspecified: Secondary | ICD-10-CM | POA: Diagnosis not present

## 2019-04-28 DIAGNOSIS — E119 Type 2 diabetes mellitus without complications: Secondary | ICD-10-CM | POA: Diagnosis not present

## 2019-04-28 DIAGNOSIS — N2581 Secondary hyperparathyroidism of renal origin: Secondary | ICD-10-CM | POA: Diagnosis not present

## 2019-04-28 DIAGNOSIS — N186 End stage renal disease: Secondary | ICD-10-CM | POA: Diagnosis not present

## 2019-04-28 DIAGNOSIS — Z992 Dependence on renal dialysis: Secondary | ICD-10-CM | POA: Diagnosis not present

## 2019-04-28 NOTE — Telephone Encounter (Signed)
Received call from Rande Lawman with [facility] needing clarification on weight bearing status. Also, need to know how long does patient need to wear hard base shoe before she can wear regular shoes?  The number to contact Benjamine Mola is [phone]

## 2019-04-29 DIAGNOSIS — I2510 Atherosclerotic heart disease of native coronary artery without angina pectoris: Secondary | ICD-10-CM | POA: Diagnosis not present

## 2019-04-29 DIAGNOSIS — I5042 Chronic combined systolic (congestive) and diastolic (congestive) heart failure: Secondary | ICD-10-CM | POA: Diagnosis not present

## 2019-04-29 DIAGNOSIS — I252 Old myocardial infarction: Secondary | ICD-10-CM | POA: Diagnosis not present

## 2019-04-29 DIAGNOSIS — E1122 Type 2 diabetes mellitus with diabetic chronic kidney disease: Secondary | ICD-10-CM | POA: Diagnosis not present

## 2019-04-29 DIAGNOSIS — Z4781 Encounter for orthopedic aftercare following surgical amputation: Secondary | ICD-10-CM | POA: Diagnosis not present

## 2019-04-29 DIAGNOSIS — I132 Hypertensive heart and chronic kidney disease with heart failure and with stage 5 chronic kidney disease, or end stage renal disease: Secondary | ICD-10-CM | POA: Diagnosis not present

## 2019-04-29 NOTE — Telephone Encounter (Signed)
Pt is s/p a left foot th ray amputation // pt is well healed last seen bu you in the office and she is having follow up with the wound care center. HHN is asking how long the pt will need to wear post op shoe before transition to regular shoe wear and what her wtb status is. Please advise.

## 2019-04-30 DIAGNOSIS — N2581 Secondary hyperparathyroidism of renal origin: Secondary | ICD-10-CM | POA: Diagnosis not present

## 2019-04-30 DIAGNOSIS — D509 Iron deficiency anemia, unspecified: Secondary | ICD-10-CM | POA: Diagnosis not present

## 2019-04-30 DIAGNOSIS — Z992 Dependence on renal dialysis: Secondary | ICD-10-CM | POA: Diagnosis not present

## 2019-04-30 DIAGNOSIS — D631 Anemia in chronic kidney disease: Secondary | ICD-10-CM | POA: Diagnosis not present

## 2019-04-30 DIAGNOSIS — E119 Type 2 diabetes mellitus without complications: Secondary | ICD-10-CM | POA: Diagnosis not present

## 2019-04-30 DIAGNOSIS — N186 End stage renal disease: Secondary | ICD-10-CM | POA: Diagnosis not present

## 2019-05-02 DIAGNOSIS — Z4781 Encounter for orthopedic aftercare following surgical amputation: Secondary | ICD-10-CM | POA: Diagnosis not present

## 2019-05-02 DIAGNOSIS — I5042 Chronic combined systolic (congestive) and diastolic (congestive) heart failure: Secondary | ICD-10-CM | POA: Diagnosis not present

## 2019-05-02 DIAGNOSIS — I132 Hypertensive heart and chronic kidney disease with heart failure and with stage 5 chronic kidney disease, or end stage renal disease: Secondary | ICD-10-CM | POA: Diagnosis not present

## 2019-05-02 DIAGNOSIS — I2510 Atherosclerotic heart disease of native coronary artery without angina pectoris: Secondary | ICD-10-CM | POA: Diagnosis not present

## 2019-05-02 DIAGNOSIS — I252 Old myocardial infarction: Secondary | ICD-10-CM | POA: Diagnosis not present

## 2019-05-02 DIAGNOSIS — E1122 Type 2 diabetes mellitus with diabetic chronic kidney disease: Secondary | ICD-10-CM | POA: Diagnosis not present

## 2019-05-03 ENCOUNTER — Encounter: Payer: Self-pay | Admitting: Vascular Surgery

## 2019-05-03 ENCOUNTER — Ambulatory Visit (INDEPENDENT_AMBULATORY_CARE_PROVIDER_SITE_OTHER): Payer: Medicare Other | Admitting: Vascular Surgery

## 2019-05-03 ENCOUNTER — Encounter: Payer: Medicare Other | Admitting: Physical Medicine and Rehabilitation

## 2019-05-03 ENCOUNTER — Encounter: Payer: Medicare Other | Admitting: Vascular Surgery

## 2019-05-03 ENCOUNTER — Other Ambulatory Visit: Payer: Self-pay

## 2019-05-03 VITALS — BP 165/81 | HR 84 | Temp 98.3°F | Resp 20 | Ht 63.0 in | Wt 160.0 lb

## 2019-05-03 DIAGNOSIS — D631 Anemia in chronic kidney disease: Secondary | ICD-10-CM | POA: Diagnosis not present

## 2019-05-03 DIAGNOSIS — Z95828 Presence of other vascular implants and grafts: Secondary | ICD-10-CM

## 2019-05-03 DIAGNOSIS — N2581 Secondary hyperparathyroidism of renal origin: Secondary | ICD-10-CM | POA: Diagnosis not present

## 2019-05-03 DIAGNOSIS — D509 Iron deficiency anemia, unspecified: Secondary | ICD-10-CM | POA: Diagnosis not present

## 2019-05-03 DIAGNOSIS — Z992 Dependence on renal dialysis: Secondary | ICD-10-CM | POA: Diagnosis not present

## 2019-05-03 DIAGNOSIS — E119 Type 2 diabetes mellitus without complications: Secondary | ICD-10-CM | POA: Diagnosis not present

## 2019-05-03 DIAGNOSIS — N186 End stage renal disease: Secondary | ICD-10-CM | POA: Diagnosis not present

## 2019-05-03 NOTE — Progress Notes (Signed)
Virtual Visit via Telephone Note  I connected with [name] on 05/04/19 at 11:15 AM EST by telephone and verified that I am speaking with the correct person using two identifiers.  Location: Patient: home Provider: [place] office    I discussed the limitations, risks, security and privacy concerns of performing an evaluation and management service by telephone and the availability of in person appointments. I also discussed with the patient that there may be a patient responsible charge related to this service. The patient expressed understanding and agreed to proceed.  I discussed the assessment and treatment plan with the patient. The patient was provided an opportunity to ask questions and all were answered. The patient agreed with the plan and demonstrated an understanding of the instructions.   The patient was advised to call back or seek an in-person evaluation if the symptoms worsen or if the condition fails to improve as anticipated.  I provided 45 minutes of non-face-to-face time during this encounter.   Pamela Ribas, NP     Hospital follow up  Assessment and Plan: Hospital visit follow up for:   [name] was seen today for hospitalization follow-up.  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 Sharol [name] Will be following up with previously established Dr. Jacqualyn [name] at [facility]  PVD (peripheral vascular disease) ([place]) S/P femoral-popliteal bypass surgery Doing well per Dr. Donnetta [name]; follow up planned in 3 months  Benign essential HTN Some confusion on correct hydralazine dose; previous 75 mg TID was reduced to 25 mg TID while hospitalized  though reason not clarified; patient states has been taking 75 mg dose without problems, no concerns per nephrology/dialysis clinic Requested she clarify if they are agreeable to this Monitor blood pressure at home; call if consistently over 150/90 Continue DASH diet.   Reminder to go to the ER if any CP, SOB, nausea, dizziness, severe HA, changes vision/speech, left arm numbness and tingling and jaw pain.  Diabetes mellitus type 2, insulin dependent ([place]) Managed with dialysis and sliding scale Novolin 70/30 15-20 units BID Recently well controlled; A1C's checked regularly by nephrology Denies episodes of hypoglycemia; continue to monitor   Anemia due to chronic kidney disease, on chronic dialysis ([place]) CBCs monitored closely by nephrology; continue aranesp injections  Chronic lower back pain without sciatica Reports symptoms well controlled by gabapentin 100 mg BID; monitor   Other orders -     gabapentin (NEURONTIN) 100 MG capsule; Take 1 capsule (100 mg total) by mouth 2 (two) times daily. -     hydrALAZINE (APRESOLINE) 25 MG tablet; Take 3 tablets (75 mg total) by mouth every 8 (eight) hours.  All medications were reviewed with patient and family and fully reconciled. All questions answered fully, and patient and family members were encouraged to call the office with any further questions or concerns. Discussed goal to avoid readmission related to this diagnosis.   Medications Discontinued During This Encounter  Medication Reason  . cephALEXin (KEFLEX) 250 MG capsule Completed Course  . traZODone (DESYREL) 50 MG tablet Patient Preference  . lidocaine (LIDODERM) 5 % Patient Preference  . cyclobenzaprine (FLEXERIL) 5 MG tablet Patient Preference  . gabapentin (NEURONTIN) 100 MG capsule Reorder  . hydrALAZINE (APRESOLINE) 25 MG tablet  Over 40 minutes of exam, counseling, chart review, and complex, high/moderate level critical decision making was performed this visit.   Future  Appointments  Date Time Provider [place]  05/18/2019  2:45 PM [name] [place] [name]  08/01/2019 11:00 AM [facility] HS VASC 3 - EM [facility]  08/01/2019 12:00 PM [facility] HS VASC 3 - EM [facility] VVS  08/01/2019 12:15 PM [facility]  08/03/2019  2:30 PM Pamela Pinto, MD [facility] None  02/02/2020  3:00 PM Pamela Pinto, MD [facility] None     HPI 74 y.o.female presents for follow up for transition from recent hospitalization or SNIF stay. Admit date to the hospital was 04/07/19, patient was discharged from the hospital on 04/20/19 and our clinical staff contacted the office the day after discharge to set up a follow up appointment. The discharge summary, medications, and diagnostic test results were reviewed before meeting with the patient. 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   Brief HPI:   [name] is a 74 y.o. 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 Patient was admitted for left little toe osteomyelitis, abscess and sepsis complicating PAD. S/p aortogram with bilateral lower extremity arteriogram with runoff 12/18 followed by left CFA endarterectomy and Dacron patch angioplasty, left femoral to below-knee popliteal bypass graft on 12/2 by Dr. Donnetta [name]. MRI of left foot showed septic arthritis of the fifth MTP joint with osteomyelitis involving the distal half of the fifth metatarsal and also the proximal phalanx.  Orthopedic service is consulted in regards to findings of MRI of left foot Dr. Sharol [name] and underwent left fifth ray amputation 04/06/2019. Was discharged home to care by husband who felt unable to care for her needs and returned to ED and admitted for a comprehensive rehab program.  Per discharge note Dr. Posey Pronto 04/20/2019 Hospital Course: [name] was admitted to rehab 04/07/2019 for inpatient therapies to consist of PT, ST and OT at least three hours five days a week. Past admission physiatrist, therapy team and rehab RN have worked together to provide customized collaborative inpatient rehab.  In regards to patient's peripheral vascular disease status post left common femoral enterectomy enterectomy and femoral to below-knee popliteal bypass 03/28/2019 with left fifth ray amputation 04/06/2019.  Dressing changes  with gauze Curlex and Ace wrap to left foot.  Surgical sites healing nicely touchdown weightbearing left lower extremity just for balance.  Patient follow-up vascular surgery as well the orthopedic services.  Pain managed with use of Flexeril as well as hydrocodone and scheduled Lidoderm patch.  Patient was scheduled  Neurontin 100 mg twice daily.  Maintained on low-dose aspirin for peripheral vascular disease as well as history of CAD with CABG.  Hemodialysis ongoing as per renal services.  Blood pressure control with hydralazine as well as scheduled Isordil.  Patient with follow-up primary MD.  Lipitor ongoing for hyperlipidemia.  Patient noted history of gout no flareups noted Zyloprim twice daily.  Diabetes mellitus as well as peripheral neuropathy Levemir 8 units full diabetic teaching.  Blood sugars 76-104-123.  Acute on chronic anemia hemoglobin 9.0 iron supplement as directed.  Drug-induced constipation resolved with laxative assistance.  Rehab course: During patient's stay in rehab weekly team conferences were held to monitor patient's progress, set goals and discuss barriers to discharge. At admission, patient required minimal assist supine to sit, minimal assist sit to supine, moderate assist ambulate 5 feet rolling walker, moderate assist stand pivot transfers.  Min mod assist for ADLs.    Follow up 05/04/2019 There were no vitals taken for this visit.   She reports feeling very well, better that she has felt in years, more energy and mood is improved, has been getting up and moving around more since discharge.   She reports home health nurse coming once a week (will be coming by today after this visit), PT on days not having dialysis twice a week.   She had follow up with Dr. Donnetta [name] (vasc surgeon), completed 7 day course of keflex for some redness as a precaution, follow up yesterday felt to be doing well and plans for 3 month follow up.   She saw Dr. Sharol Given PA Coralyn Pear on 1/13, 1/20 and reports has been released from their care.  She will be following up with Dr. Jacqualyn [name] for her feet/ amputation, plan for orthotic shoes for improved balance. She continues to follow up with wound center for a wound to her right sole.   She reports lower back pain is currently well controlled with gabapentin and tylenol.  Has norco 5/325mg  to use PRN, hasn't needed since adding gabapentin.   She has a history of Combined Systolic and Diastolic CHF managed by dialysis, also hydralazine (there is confusion about appropriate dose, has been on 75 mg TID for many years, appears was reduced to 25 mg TID while hospitalized but patient has resumed previous 75 mg TID without problems and prefers to continue this; reason for decrease to 25 mg TID was not clarified in hospital notes), also on isosorbide 10 mg TID.  Denies dyspnea on exertion, orthopnea, paroxysmal nocturnal dyspnea and edema. Positive for none. Wt Readings from Last 3 Encounters:  05/03/19 160 lb (72.6 kg)  04/27/19 150 lb (68 kg)  04/26/19 150 lb (68 kg)   Her blood pressure has been controlled at home (reports 120-160s/60s, no problems at dialysis), today their BP is    She does workout. She denies chest pain, shortness of breath, dizziness.   She has been working on diet and exercise for T2 diabetes, per her preference she is off of levemir and short acting insulin and back on BID novolin 70/30 sliding scale (10-15 units BID, occasionally up to 30 units for rare hyperglycemia) and denies hypoglycemia , increased appetite, nausea, polydipsia and visual disturbances.  Last A1C in the office was:  Lab Results  Component Value Date   HGBA1C 6.8 (H) 03/23/2019    Lab Results  Component Value Date   CHOL 103 01/10/2019   HDL 46 (L) 01/10/2019   LDLCALC 40 01/10/2019   TRIG 87 01/10/2019   CHOLHDL 2.2 01/10/2019    She has anemia followed by nephrology;  Lab Results  Component Value Date   WBC 7.0 04/19/2019   HGB 9.1 (L) 04/19/2019   HCT 29.6 (L) 04/19/2019   MCV 100.3 (H) 04/19/2019   PLT 234 04/19/2019   She is doing hemodialysis Tuesday, Thursday, Saturday and doing well. She follows with Dr. Lorrene Reid.  Lab Results  Component Value Date   GFRNONAA 5 (L) 04/19/2019   Home health is involved.   Images while in the hospital: DG Abd 1  View  Result Date: 04/08/2019 CLINICAL DATA:  Constipation EXAM: ABDOMEN - 1 VIEW COMPARISON:  Chest x-ray 03/23/2019 FINDINGS: Nonobstructed gas pattern with large volume of stool throughout the colon. Extensive vascular calcification. No radiopaque calculi. Punctate calcifications in the pelvis likely reflecting phleboliths. IMPRESSION: Nonobstructed gas pattern with large volume of feces throughout the colon and rectum Electronically Signed   By: Donavan Foil M.D.   On: 04/08/2019 20:04    Current Outpatient Medications (Endocrine & Metabolic):  .  cinacalcet (SENSIPAR) 30 MG tablet, Take 1 tablet (30 mg total) by mouth daily with supper. Marland Kitchen  doxercalciferol (HECTOROL) 4 MCG/2ML injection, Inject 2 mLs (4 mcg total) into the vein Every Tuesday,Thursday,and Saturday with dialysis. Marland Kitchen  insulin NPH-regular Human (70-30) 100 UNIT/ML injection, Inject into the skin 2 (two) times daily with a meal.  Current Outpatient Medications (Cardiovascular):  .  atorvastatin (LIPITOR) 10 MG tablet, Take 1 tablet (10 mg total) by mouth daily at 6 PM. .  hydrALAZINE (APRESOLINE) 25 MG tablet, Take 3 tablets (75 mg total) by mouth every 8 (eight) hours. .  isosorbide dinitrate (ISORDIL) 10 MG tablet, Take 1 tablet 3 x /day for BP & Heart   Current Outpatient Medications (Analgesics):  .  acetaminophen (TYLENOL) 325 MG tablet, Take 1-2 tablets (325-650 mg total) by mouth every 4 (four) hours as needed for mild pain (or temp >/= 101 F). Marland Kitchen  allopurinol (ZYLOPRIM) 100 MG tablet, Take 1 tablet 2 x /day to Prevent Gout .  aspirin EC 81 MG tablet, Take 1 tablet (81 mg total) by mouth daily. Marland Kitchen  HYDROcodone-acetaminophen (NORCO/VICODIN) 5-325 MG tablet, Take 0.5-1 tablets by mouth every 6 (six) hours as needed for severe pain.  Current Outpatient Medications (Hematological):  Marland Kitchen  Darbepoetin Alfa (ARANESP) 100 MCG/0.5ML SOSY injection, Inject 0.5 mLs (100 mcg total) into the vein every Tuesday with hemodialysis. .   ferric gluconate 125 mg in sodium chloride 0.9 % 100 mL, Inject 125 mg into the vein Every Tuesday,Thursday,and Saturday with dialysis.  Current Outpatient Medications (Other):  .  b complex-vitamin c-folic acid (NEPHRO-VITE) 0.8 MG TABS tablet, Take 1 tablet by mouth 3 (three) times a week. Tuesday, Thursday, Saturday .  docusate sodium (COLACE) 100 MG capsule, Take 100 mg by mouth at bedtime. .  Multiple Vitamins-Minerals (PRESERVISION AREDS 2 PO), Take 2 capsules by mouth daily. .  pantoprazole (PROTONIX) 40 MG tablet, Take 1 tablet (40 mg total) by mouth daily. .  polyethylene glycol (MIRALAX / GLYCOLAX) 17 g packet, Take 17 g by mouth daily. .  Probiotic Product (PROBIOTIC DAILY PO), Take 1 tablet by mouth daily. .  sevelamer carbonate (RENVELA)  800 MG tablet, Take 2 tablets (1,600 mg total) by mouth 3 (three) times daily with meals. .  gabapentin (NEURONTIN) 100 MG capsule, Take 1 capsule (100 mg total) by mouth 2 (two) times daily.  Past Medical History:  Diagnosis Date  . Allergy   . Arthritis    "maybe in my lower back" (08/27/2012)  . 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 Chronic combined systolic and diastolic CHF (congestive heart failure) ([place])   . CKD (chronic kidney disease), stage IV ([place])   . Coronary artery disease    a. NSTEMI in 05/2009 s/p CABG (LIMA-LAD, SVG-diagonal, SVG-OM1/OM 2).   Marland Kitchen Dyspnea    with exertion  . Family history of anesthesia complication    Mom has a hard time to wake up  . Foot ulcer ([place])    "I've had them on both feet" (08/27/2012)  . Gouty arthritis    "right index finger" (08/27/2012)  . History of radiation therapy 06/09/13-07/06/13   left breast 50Gy  . Hyperlipidemia   . Hypertension   . Infection    right second toe  . Ischemic cardiomyopathy    a. 2011: EF 40-45%. b. EF 55-60% in 04/2017 but shortly after as inpatient was 45-50%.  Marland Kitchen LBBB (left bundle branch block)   . Mitral  regurgitation   . Neuropathy    Hx; of B/L feet  . NSTEMI (non-ST elevated myocardial infarction) ([place]) 05/23/2009  . Osteomyelitis of foot ([place])   . PAD (peripheral artery disease) (HCC)    a. LE PAD (patient previously elected hold off L fem-pop), prev followed by Dr. Bridgett Larsson  . PONV (postoperative nausea and vomiting)   . Sinus headache    "occasionally" (08/27/2012)  . Type II diabetes mellitus ([place])   . Vitamin D deficiency      Allergies  Allergen Reactions  . Atenolol Other (See Comments) and Rash    Exacerbates gout Exacerbates gout  . Adhesive [Tape] Other (See Comments)    SKIN IS VERY SENSITIVE AND BRUISES AND TEARS EASILY; PLEASE USE AN ALTERNATIVE TO TAPE!!  . Contrast Media [Iodinated Diagnostic Agents] Rash  . Iohexol Rash and Other (See Comments)  . Latex Rash    ROS: all negative except above.   Physical Exam: There were no vitals filed for this visit. There were no vitals taken for this visit.  General : Well sounding patient in no apparent distress HEENT: no hoarseness, no cough for duration of visit Lungs: speaks in complete sentences, no audible wheezing, no apparent distress Neurological: alert, oriented x 3 Psychiatric: pleasant, upbeat, judgement appropriate   Pamela Ribas, NP 12:45 PM [facility]

## 2019-05-03 NOTE — Progress Notes (Signed)
Patient name: [name] MRN: [id number] DOB: June 04, 1945 Sex: female  REASON FOR VISIT: Follow-up recent left femoral to popliteal bypass and left femoral endarterectomy with Dacron patch angioplasty  HPI: [name] is a 74 y.o. female here today for follow-up.  Continues to do well.  Is having the typical amount of swelling in her lower extremity.  She does have changes of chronic venous hypertension bilaterally.  Her surgery was on 03/28/2019 and she subsequently underwent ray amputation fifth toe by Dr. Sharol Given on 04/06/2019.  Current Outpatient Medications  Medication Sig Dispense Refill  . acetaminophen (TYLENOL) 325 MG tablet Take 1-2 tablets (325-650 mg total) by mouth every 4 (four) hours as needed for mild pain (or temp >/= 101 F).    Marland Kitchen allopurinol (ZYLOPRIM) 100 MG tablet Take 1 tablet 2 x /day to Prevent Gout 180 tablet 3  . aspirin EC 81 MG tablet Take 1 tablet (81 mg total) by mouth daily.    Marland Kitchen atorvastatin (LIPITOR) 10 MG tablet Take 1 tablet (10 mg total) by mouth daily at 6 PM. 30 tablet 0  . b complex-vitamin c-folic acid (NEPHRO-VITE) 0.8 MG TABS tablet Take 1 tablet by mouth 3 (three) times a week. Tuesday, Thursday, Saturday    . cephALEXin (KEFLEX) 250 MG capsule Take 1 capsule (250 mg total) by mouth 2 (two) times daily. 14 capsule 0  . cinacalcet (SENSIPAR) 30 MG tablet Take 1 tablet (30 mg total) by mouth daily with supper. 60 tablet 1  . cyclobenzaprine (FLEXERIL) 5 MG tablet Take 1 tablet (5 mg total) by mouth 3 (three) times daily as needed for muscle spasms. 60 tablet 0  . Darbepoetin Alfa (ARANESP) 100 MCG/0.5ML SOSY injection Inject 0.5 mLs (100 mcg total) into the vein every Tuesday with hemodialysis. 4.2 mL   . docusate sodium (COLACE) 100 MG capsule Take 100 mg by mouth at bedtime.    Marland Kitchen doxercalciferol (HECTOROL) 4 MCG/2ML injection Inject 2 mLs (4 mcg total) into the vein Every Tuesday,Thursday,and Saturday with  dialysis. 2 mL   . ferric gluconate 125 mg in sodium chloride 0.9 % 100 mL Inject 125 mg into the vein Every Tuesday,Thursday,and Saturday with dialysis.    Marland Kitchen gabapentin (NEURONTIN) 100 MG capsule Take 1 capsule (100 mg total) by mouth 2 (two) times daily. 60 capsule 1  . hydrALAZINE (APRESOLINE) 25 MG tablet Take 1 tablet (25 mg total) by mouth every 8 (eight) hours. 90 tablet 1  . HYDROcodone-acetaminophen (NORCO/VICODIN) 5-325 MG tablet Take 0.5-1 tablets by mouth every 6 (six) hours as needed for severe pain. 30 tablet 0  . insulin NPH-regular Human (70-30) 100 UNIT/ML injection Inject into the skin 2 (two) times daily with a meal.    . isosorbide dinitrate (ISORDIL) 10 MG tablet Take 1 tablet 3 x /day for BP & Heart 270 tablet 3  . lidocaine (LIDODERM) 5 % Place 1 patch onto the skin daily. Remove & Discard patch within 12 hours or as directed by MD 30 patch 0  . Multiple Vitamins-Minerals (PRESERVISION AREDS 2 PO) Take 2 capsules by mouth daily.    . pantoprazole (PROTONIX) 40 MG tablet Take 1 tablet (40 mg total) by mouth daily. 30 tablet 0  . polyethylene glycol (MIRALAX / GLYCOLAX) 17 g packet Take 17 g by mouth daily.    . Probiotic Product (PROBIOTIC DAILY PO) Take 1 tablet by mouth daily.    . sevelamer carbonate (RENVELA) 800 MG tablet Take 2 tablets (1,600 mg total)  by mouth 3 (three) times daily with meals. 90 tablet 1  . traZODone (DESYREL) 50 MG tablet 1/2-2 tablets 1 hour prior to bedtime for sleep. 60 tablet 2   No current facility-administered medications for this visit.     PHYSICAL EXAM: Vitals:   05/03/19 1033  BP: (!) 165/81  Pulse: 84  Resp: 20  Temp: 98.3 F (36.8 C)  SpO2: 97%  Weight: 160 lb (72.6 kg)  Height: 5\' 3"  (1.6 m)    GENERAL: The patient is a well-nourished female, in no acute distress. The vital signs are documented above. Groin and all surgical incisions well-healed.  To amputation healing quite nicely.  She has an easily palpable popliteal  pulse.  MEDICAL ISSUES: Stable overall.  She was seen last week in our office with some erythema distally.  Was placed on 7 days of Keflex.  This has resolved.  We will continue her usual activities.  We will see her in 3 months with noninvasive studies   [name], MD [facility] Tel [phone] Pager [phone]

## 2019-05-04 ENCOUNTER — Other Ambulatory Visit: Payer: Self-pay | Admitting: *Deleted

## 2019-05-04 ENCOUNTER — Encounter: Payer: Medicare Other | Admitting: Physical Medicine and Rehabilitation

## 2019-05-04 ENCOUNTER — Ambulatory Visit: Payer: Medicare Other | Admitting: Adult Health

## 2019-05-04 ENCOUNTER — Encounter: Payer: Self-pay | Admitting: Adult Health

## 2019-05-04 DIAGNOSIS — I739 Peripheral vascular disease, unspecified: Secondary | ICD-10-CM | POA: Diagnosis not present

## 2019-05-04 DIAGNOSIS — N186 End stage renal disease: Secondary | ICD-10-CM

## 2019-05-04 DIAGNOSIS — G8929 Other chronic pain: Secondary | ICD-10-CM

## 2019-05-04 DIAGNOSIS — Z95828 Presence of other vascular implants and grafts: Secondary | ICD-10-CM

## 2019-05-04 DIAGNOSIS — E119 Type 2 diabetes mellitus without complications: Secondary | ICD-10-CM

## 2019-05-04 DIAGNOSIS — M545 Low back pain: Secondary | ICD-10-CM | POA: Diagnosis not present

## 2019-05-04 DIAGNOSIS — R531 Weakness: Secondary | ICD-10-CM | POA: Diagnosis not present

## 2019-05-04 DIAGNOSIS — I132 Hypertensive heart and chronic kidney disease with heart failure and with stage 5 chronic kidney disease, or end stage renal disease: Secondary | ICD-10-CM | POA: Diagnosis not present

## 2019-05-04 DIAGNOSIS — R5381 Other malaise: Secondary | ICD-10-CM

## 2019-05-04 DIAGNOSIS — E0822 Diabetes mellitus due to underlying condition with diabetic chronic kidney disease: Secondary | ICD-10-CM

## 2019-05-04 DIAGNOSIS — E1122 Type 2 diabetes mellitus with diabetic chronic kidney disease: Secondary | ICD-10-CM | POA: Diagnosis not present

## 2019-05-04 DIAGNOSIS — I5042 Chronic combined systolic (congestive) and diastolic (congestive) heart failure: Secondary | ICD-10-CM | POA: Diagnosis not present

## 2019-05-04 DIAGNOSIS — D631 Anemia in chronic kidney disease: Secondary | ICD-10-CM

## 2019-05-04 DIAGNOSIS — Z794 Long term (current) use of insulin: Secondary | ICD-10-CM

## 2019-05-04 DIAGNOSIS — I252 Old myocardial infarction: Secondary | ICD-10-CM | POA: Diagnosis not present

## 2019-05-04 DIAGNOSIS — Z992 Dependence on renal dialysis: Secondary | ICD-10-CM

## 2019-05-04 DIAGNOSIS — Z89422 Acquired absence of other left toe(s): Secondary | ICD-10-CM | POA: Diagnosis not present

## 2019-05-04 DIAGNOSIS — Z4781 Encounter for orthopedic aftercare following surgical amputation: Secondary | ICD-10-CM | POA: Diagnosis not present

## 2019-05-04 DIAGNOSIS — I10 Essential (primary) hypertension: Secondary | ICD-10-CM | POA: Diagnosis not present

## 2019-05-04 DIAGNOSIS — I2510 Atherosclerotic heart disease of native coronary artery without angina pectoris: Secondary | ICD-10-CM | POA: Diagnosis not present

## 2019-05-04 MED ORDER — HYDRALAZINE HCL 25 MG PO TABS: 75.0000 mg | ORAL_TABLET | Freq: Three times a day (TID) | ORAL | 1 refills | Status: DC

## 2019-05-04 MED ORDER — GABAPENTIN 100 MG PO CAPS: 100.0000 mg | ORAL_CAPSULE | Freq: Two times a day (BID) | ORAL | 1 refills | Status: DC

## 2019-05-05 DIAGNOSIS — Z992 Dependence on renal dialysis: Secondary | ICD-10-CM | POA: Diagnosis not present

## 2019-05-05 DIAGNOSIS — N186 End stage renal disease: Secondary | ICD-10-CM | POA: Diagnosis not present

## 2019-05-05 DIAGNOSIS — N2581 Secondary hyperparathyroidism of renal origin: Secondary | ICD-10-CM | POA: Diagnosis not present

## 2019-05-05 DIAGNOSIS — D631 Anemia in chronic kidney disease: Secondary | ICD-10-CM | POA: Diagnosis not present

## 2019-05-05 DIAGNOSIS — E119 Type 2 diabetes mellitus without complications: Secondary | ICD-10-CM | POA: Diagnosis not present

## 2019-05-05 DIAGNOSIS — D509 Iron deficiency anemia, unspecified: Secondary | ICD-10-CM | POA: Diagnosis not present

## 2019-05-06 DIAGNOSIS — E1122 Type 2 diabetes mellitus with diabetic chronic kidney disease: Secondary | ICD-10-CM | POA: Diagnosis not present

## 2019-05-06 DIAGNOSIS — I252 Old myocardial infarction: Secondary | ICD-10-CM | POA: Diagnosis not present

## 2019-05-06 DIAGNOSIS — I2510 Atherosclerotic heart disease of native coronary artery without angina pectoris: Secondary | ICD-10-CM | POA: Diagnosis not present

## 2019-05-06 DIAGNOSIS — I5042 Chronic combined systolic (congestive) and diastolic (congestive) heart failure: Secondary | ICD-10-CM | POA: Diagnosis not present

## 2019-05-06 DIAGNOSIS — Z4781 Encounter for orthopedic aftercare following surgical amputation: Secondary | ICD-10-CM | POA: Diagnosis not present

## 2019-05-06 DIAGNOSIS — I132 Hypertensive heart and chronic kidney disease with heart failure and with stage 5 chronic kidney disease, or end stage renal disease: Secondary | ICD-10-CM | POA: Diagnosis not present

## 2019-05-07 DIAGNOSIS — Z992 Dependence on renal dialysis: Secondary | ICD-10-CM | POA: Diagnosis not present

## 2019-05-07 DIAGNOSIS — N2581 Secondary hyperparathyroidism of renal origin: Secondary | ICD-10-CM | POA: Diagnosis not present

## 2019-05-07 DIAGNOSIS — D509 Iron deficiency anemia, unspecified: Secondary | ICD-10-CM | POA: Diagnosis not present

## 2019-05-07 DIAGNOSIS — N186 End stage renal disease: Secondary | ICD-10-CM | POA: Diagnosis not present

## 2019-05-07 DIAGNOSIS — E119 Type 2 diabetes mellitus without complications: Secondary | ICD-10-CM | POA: Diagnosis not present

## 2019-05-07 DIAGNOSIS — D631 Anemia in chronic kidney disease: Secondary | ICD-10-CM | POA: Diagnosis not present

## 2019-05-09 DIAGNOSIS — I2510 Atherosclerotic heart disease of native coronary artery without angina pectoris: Secondary | ICD-10-CM | POA: Diagnosis not present

## 2019-05-09 DIAGNOSIS — N2581 Secondary hyperparathyroidism of renal origin: Secondary | ICD-10-CM | POA: Diagnosis not present

## 2019-05-09 DIAGNOSIS — Z992 Dependence on renal dialysis: Secondary | ICD-10-CM | POA: Diagnosis not present

## 2019-05-09 DIAGNOSIS — E1122 Type 2 diabetes mellitus with diabetic chronic kidney disease: Secondary | ICD-10-CM | POA: Diagnosis not present

## 2019-05-09 DIAGNOSIS — E119 Type 2 diabetes mellitus without complications: Secondary | ICD-10-CM | POA: Diagnosis not present

## 2019-05-09 DIAGNOSIS — I132 Hypertensive heart and chronic kidney disease with heart failure and with stage 5 chronic kidney disease, or end stage renal disease: Secondary | ICD-10-CM | POA: Diagnosis not present

## 2019-05-09 DIAGNOSIS — I252 Old myocardial infarction: Secondary | ICD-10-CM | POA: Diagnosis not present

## 2019-05-09 DIAGNOSIS — Z4781 Encounter for orthopedic aftercare following surgical amputation: Secondary | ICD-10-CM | POA: Diagnosis not present

## 2019-05-09 DIAGNOSIS — D631 Anemia in chronic kidney disease: Secondary | ICD-10-CM | POA: Diagnosis not present

## 2019-05-09 DIAGNOSIS — I5042 Chronic combined systolic (congestive) and diastolic (congestive) heart failure: Secondary | ICD-10-CM | POA: Diagnosis not present

## 2019-05-09 DIAGNOSIS — N186 End stage renal disease: Secondary | ICD-10-CM | POA: Diagnosis not present

## 2019-05-09 DIAGNOSIS — D509 Iron deficiency anemia, unspecified: Secondary | ICD-10-CM | POA: Diagnosis not present

## 2019-05-10 DIAGNOSIS — D631 Anemia in chronic kidney disease: Secondary | ICD-10-CM | POA: Diagnosis not present

## 2019-05-10 DIAGNOSIS — N186 End stage renal disease: Secondary | ICD-10-CM | POA: Diagnosis not present

## 2019-05-10 DIAGNOSIS — D509 Iron deficiency anemia, unspecified: Secondary | ICD-10-CM | POA: Diagnosis not present

## 2019-05-10 DIAGNOSIS — Z992 Dependence on renal dialysis: Secondary | ICD-10-CM | POA: Diagnosis not present

## 2019-05-10 DIAGNOSIS — E119 Type 2 diabetes mellitus without complications: Secondary | ICD-10-CM | POA: Diagnosis not present

## 2019-05-10 DIAGNOSIS — N2581 Secondary hyperparathyroidism of renal origin: Secondary | ICD-10-CM | POA: Diagnosis not present

## 2019-05-11 DIAGNOSIS — I132 Hypertensive heart and chronic kidney disease with heart failure and with stage 5 chronic kidney disease, or end stage renal disease: Secondary | ICD-10-CM | POA: Diagnosis not present

## 2019-05-11 DIAGNOSIS — Z4781 Encounter for orthopedic aftercare following surgical amputation: Secondary | ICD-10-CM | POA: Diagnosis not present

## 2019-05-11 DIAGNOSIS — I5042 Chronic combined systolic (congestive) and diastolic (congestive) heart failure: Secondary | ICD-10-CM | POA: Diagnosis not present

## 2019-05-11 DIAGNOSIS — I2510 Atherosclerotic heart disease of native coronary artery without angina pectoris: Secondary | ICD-10-CM | POA: Diagnosis not present

## 2019-05-11 DIAGNOSIS — I252 Old myocardial infarction: Secondary | ICD-10-CM | POA: Diagnosis not present

## 2019-05-11 DIAGNOSIS — E1122 Type 2 diabetes mellitus with diabetic chronic kidney disease: Secondary | ICD-10-CM | POA: Diagnosis not present

## 2019-05-12 DIAGNOSIS — N2581 Secondary hyperparathyroidism of renal origin: Secondary | ICD-10-CM | POA: Diagnosis not present

## 2019-05-12 DIAGNOSIS — E119 Type 2 diabetes mellitus without complications: Secondary | ICD-10-CM | POA: Diagnosis not present

## 2019-05-12 DIAGNOSIS — N186 End stage renal disease: Secondary | ICD-10-CM | POA: Diagnosis not present

## 2019-05-12 DIAGNOSIS — Z992 Dependence on renal dialysis: Secondary | ICD-10-CM | POA: Diagnosis not present

## 2019-05-12 DIAGNOSIS — D631 Anemia in chronic kidney disease: Secondary | ICD-10-CM | POA: Diagnosis not present

## 2019-05-12 DIAGNOSIS — D509 Iron deficiency anemia, unspecified: Secondary | ICD-10-CM | POA: Diagnosis not present

## 2019-05-13 DIAGNOSIS — Z4781 Encounter for orthopedic aftercare following surgical amputation: Secondary | ICD-10-CM | POA: Diagnosis not present

## 2019-05-13 DIAGNOSIS — I252 Old myocardial infarction: Secondary | ICD-10-CM | POA: Diagnosis not present

## 2019-05-13 DIAGNOSIS — I2510 Atherosclerotic heart disease of native coronary artery without angina pectoris: Secondary | ICD-10-CM | POA: Diagnosis not present

## 2019-05-13 DIAGNOSIS — I132 Hypertensive heart and chronic kidney disease with heart failure and with stage 5 chronic kidney disease, or end stage renal disease: Secondary | ICD-10-CM | POA: Diagnosis not present

## 2019-05-13 DIAGNOSIS — I5042 Chronic combined systolic (congestive) and diastolic (congestive) heart failure: Secondary | ICD-10-CM | POA: Diagnosis not present

## 2019-05-13 DIAGNOSIS — E1122 Type 2 diabetes mellitus with diabetic chronic kidney disease: Secondary | ICD-10-CM | POA: Diagnosis not present

## 2019-05-14 DIAGNOSIS — D631 Anemia in chronic kidney disease: Secondary | ICD-10-CM | POA: Diagnosis not present

## 2019-05-14 DIAGNOSIS — Z992 Dependence on renal dialysis: Secondary | ICD-10-CM | POA: Diagnosis not present

## 2019-05-14 DIAGNOSIS — N2581 Secondary hyperparathyroidism of renal origin: Secondary | ICD-10-CM | POA: Diagnosis not present

## 2019-05-14 DIAGNOSIS — N186 End stage renal disease: Secondary | ICD-10-CM | POA: Diagnosis not present

## 2019-05-14 DIAGNOSIS — D509 Iron deficiency anemia, unspecified: Secondary | ICD-10-CM | POA: Diagnosis not present

## 2019-05-14 DIAGNOSIS — E119 Type 2 diabetes mellitus without complications: Secondary | ICD-10-CM | POA: Diagnosis not present

## 2019-05-16 DIAGNOSIS — I132 Hypertensive heart and chronic kidney disease with heart failure and with stage 5 chronic kidney disease, or end stage renal disease: Secondary | ICD-10-CM | POA: Diagnosis not present

## 2019-05-16 DIAGNOSIS — I5042 Chronic combined systolic (congestive) and diastolic (congestive) heart failure: Secondary | ICD-10-CM | POA: Diagnosis not present

## 2019-05-16 DIAGNOSIS — E1122 Type 2 diabetes mellitus with diabetic chronic kidney disease: Secondary | ICD-10-CM | POA: Diagnosis not present

## 2019-05-16 DIAGNOSIS — Z4781 Encounter for orthopedic aftercare following surgical amputation: Secondary | ICD-10-CM | POA: Diagnosis not present

## 2019-05-16 DIAGNOSIS — I252 Old myocardial infarction: Secondary | ICD-10-CM | POA: Diagnosis not present

## 2019-05-16 DIAGNOSIS — I2510 Atherosclerotic heart disease of native coronary artery without angina pectoris: Secondary | ICD-10-CM | POA: Diagnosis not present

## 2019-05-17 DIAGNOSIS — N186 End stage renal disease: Secondary | ICD-10-CM | POA: Diagnosis not present

## 2019-05-17 DIAGNOSIS — D631 Anemia in chronic kidney disease: Secondary | ICD-10-CM | POA: Diagnosis not present

## 2019-05-17 DIAGNOSIS — N2581 Secondary hyperparathyroidism of renal origin: Secondary | ICD-10-CM | POA: Diagnosis not present

## 2019-05-17 DIAGNOSIS — D509 Iron deficiency anemia, unspecified: Secondary | ICD-10-CM | POA: Diagnosis not present

## 2019-05-17 DIAGNOSIS — Z992 Dependence on renal dialysis: Secondary | ICD-10-CM | POA: Diagnosis not present

## 2019-05-17 DIAGNOSIS — E119 Type 2 diabetes mellitus without complications: Secondary | ICD-10-CM | POA: Diagnosis not present

## 2019-05-18 ENCOUNTER — Encounter (HOSPITAL_BASED_OUTPATIENT_CLINIC_OR_DEPARTMENT_OTHER): Payer: Medicare Other | Admitting: Physician Assistant

## 2019-05-18 ENCOUNTER — Telehealth: Payer: Self-pay

## 2019-05-18 ENCOUNTER — Encounter (HOSPITAL_BASED_OUTPATIENT_CLINIC_OR_DEPARTMENT_OTHER): Payer: Medicare Other | Attending: Physician Assistant | Admitting: Physician Assistant

## 2019-05-18 ENCOUNTER — Other Ambulatory Visit: Payer: Self-pay

## 2019-05-18 DIAGNOSIS — E1151 Type 2 diabetes mellitus with diabetic peripheral angiopathy without gangrene: Secondary | ICD-10-CM | POA: Diagnosis not present

## 2019-05-18 DIAGNOSIS — I2510 Atherosclerotic heart disease of native coronary artery without angina pectoris: Secondary | ICD-10-CM | POA: Insufficient documentation

## 2019-05-18 DIAGNOSIS — I132 Hypertensive heart and chronic kidney disease with heart failure and with stage 5 chronic kidney disease, or end stage renal disease: Secondary | ICD-10-CM | POA: Insufficient documentation

## 2019-05-18 DIAGNOSIS — E11621 Type 2 diabetes mellitus with foot ulcer: Secondary | ICD-10-CM | POA: Diagnosis not present

## 2019-05-18 DIAGNOSIS — L97512 Non-pressure chronic ulcer of 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d: Secondary | ICD-10-CM | POA: Insufficient documentation

## 2019-05-18 DIAGNOSIS — N186 End stage renal disease: Secondary | ICD-10-CM | POA: Diagnosis not present

## 2019-05-18 DIAGNOSIS — Z992 Dependence on renal dialysis: Secondary | ICD-10-CM | POA: Insufficient documentation

## 2019-05-18 DIAGNOSIS — Z4781 Encounter for orthopedic aftercare following surgical amputation: Secondary | ICD-10-CM | POA: Diagnosis not present

## 2019-05-18 DIAGNOSIS — L97822 Non-pressure chronic ulcer of other part of left lower leg with fat layer exposed: Secondary | ICD-10-CM | POA: Diagnosis not present

## 2019-05-18 DIAGNOSIS — E1140 Type 2 diabetes mellitus with diabetic neuropathy, unspecified: Secondary | ICD-10-CM | POA: Diagnosis not present

## 2019-05-18 DIAGNOSIS — D649 Anemia, unspecified: Secondary | ICD-10-CM | POA: Insufficient documentation

## 2019-05-18 DIAGNOSIS — E1122 Type 2 diabetes mellitus with diabetic chronic kidney disease: Secondary | ICD-10-CM | POA: Insufficient documentation

## 2019-05-18 DIAGNOSIS — M1990 Unspecified osteoarthritis, unspecified site: Secondary | ICD-10-CM | POA: Insufficient documentation

## 2019-05-18 DIAGNOSIS — L97812 Non-pressure chronic ulcer of other part of right lower leg with fat layer exposed: Secondary | ICD-10-CM | POA: Insufficient documentation

## 2019-05-18 DIAGNOSIS — I252 Old myocardial infarction: Secondary | ICD-10-CM | POA: Diagnosis not present

## 2019-05-18 DIAGNOSIS — I890 Lymphedema, not elsewhere classified: Secondary | ICD-10-CM | POA: Insufficient documentation

## 2019-05-18 DIAGNOSIS — I5042 Chronic combined systolic (congestive) and diastolic (congestive) heart failure: Secondary | ICD-10-CM | POA: Diagnosis not present

## 2019-05-18 DIAGNOSIS — I872 Venous insufficiency (chronic) (peripheral): Secondary | ICD-10-CM | POA: Diagnosis not present

## 2019-05-18 DIAGNOSIS — J45909 Unspecified asthma, uncomplicated: Secondary | ICD-10-CM | POA: Diagnosis not present

## 2019-05-18 DIAGNOSIS — L97829 Non-pressure chronic ulcer of other part of left lower leg with unspecified severity: Secondary | ICD-10-CM | POA: Diagnosis not present

## 2019-05-18 NOTE — Telephone Encounter (Signed)
-----   Message from [name], MD sent at 05/16/2019  9:09 AM EST ----- Regarding: RE: [place]: [phone] Lets see if we can get her into the PA clinic sometime this week or next.  Thanks  ----- Message ----- From: Kaleen Mask, LPN Sent: 0/06/4740  59:56 PM EST To: [name], MD Subject: [place].  Beth with [facility] called concerned that this patient is still having left lower leg swelling  despite elevation.  She has finished her round of Keflex.  Her left leg is displaying more warmth and redness with +1 edema.  She is using an ace wrap bandage with a plan to transition to compression stocking.  Her appt isn't until April.    Please advise.    Thank you,  Thurston Hole., LPN

## 2019-05-18 NOTE — Telephone Encounter (Signed)
I spoke to [place] at [place] and gave her this message.  She is seeing patient today and will call to schedule a follow up visit with the PA.    Thurston Hole., LPN

## 2019-05-19 NOTE — Progress Notes (Signed)
MAO, LOCKNER ([id number]) Visit Report for 05/18/2019 Chief Complaint Document Details Patient Name: Date of Service: [name] 05/18/2019 2:45 PM Medical Record [id number] Patient Account Number: [id number] Date of Birth/Sex: Treating RN: 02/05/1946 (74 y.o. [name] Primary Care Provider: [name] Other Clinician: Referring Provider: Treating Provider/Extender:[name]: 12 Information Obtained from: Patient Chief Complaint Bilateral LE ulcers and Right plantar foot ulcer Electronic Signature(s) Signed: 05/18/2019 5:54:35 PM By: Worthy Keeler PA-C Previous Signature: 05/18/2019 4:48:33 PM Version By: Worthy Keeler PA-C Entered By: Worthy Keeler on 05/18/2019 17:54:35 -------------------------------------------------------------------------------- HPI Details Patient Name: Date of Service: [name] 05/18/2019 2:45 PM Medical Record [id number] Patient Account Number: [id number] Date of Birth/Sex: Treating RN: Mar 12, 1946 (73 y.o. [name] Primary Care Provider: [name] Other Clinician: Referring Provider: Treating Provider/Extender:[name]: 12 History of Present Illness HPI Description: 02/23/2019 on evaluation today patient presents for initial evaluation in our clinic concerning issues that she has been having with her bilateral lower extremities as well as her plantar foot on the right. This has been quite some time in regard to the foot ulcer even back as far as the beginning of this year that is 2020. She has had the wounds on her legs a much shorter time but that still has been roughly 2-3 months. Nonetheless she is really not shown signs of a lot of improvement. She tells me that she does have a past medical history of diabetes, venous insufficiency, lymphedema which is mild but nonetheless present, peripheral  vascular disease and she has previously been recommended to have a femoral-popliteal bypass but put that off at that point they are basically just monitoring at this time. She has end-stage renal disease with dependence on renal dialysis, hypertension, and obviously wounds of the bilateral lower extremities at this point. She tells me that she is having some discomfort but fortunately nothing too significant at this point which is good news. No fevers, chills, nausea, vomiting, or diarrhea. She is mainly been leaving these areas open to air as much as possible at this point at 1 point she was trying to keep them covered more but that really did not help either. Fortunately there is no signs of active systemic nor local infection at this time. 03/02/2019 on evaluation today patient appears to be doing well with regard to her wounds. The leg ulcers which were very dry are much more moist at this point and seem to be doing great at this time. Overall I am very pleased with how things seem to be progressing. With regard to her foot ulcer it is showing some signs of slight improvement in my opinion overall there does not appear to be anything worse at least which is also good news. No fevers, chills, nausea, vomiting, or diarrhea. 03/09/2019 on evaluation today patient appears to be doing better with regard to her lower extremity ulcers a lot of the necrotic tissue is loosening up which is good news. In regard to her right plantar foot ulcer this appears to be roughly the same based on what I am seeing currently although there is less callus buildup. Unfortunately she does have an open wound area on the left lateral foot region which unfortunately probes down to bone once this was thoroughly examined. This is something she did not even know was opened she had had a Band-Aid over it just for protection last  couple times I saw her. 03/16/19 evaluation today patient appears to be doing Someone poorly in  regard to her left lower extremity. This seems to be likely infected based on what Im seeing at this point. Theres no signs of active infection at this time systemically but nonetheless theres a lot more erythema of the foot in general unfortunately. Her x-rays were reviewed and did not show any evidence of infection of the right foot. On the left there was a A little bit more going on but no obvious evidence of osteomyelitis though it was mentioned that if that was still a concern that number I would be the best way to further evaluate this. I think they were going to need to proceed with MRI to be honest based on what Im seeing today. 03/23/2019 on evaluation today patient actually appears to be doing worse compared to even last week's visit with regard to her wounds especially the left foot but even the right lower extremity. Both are measuring deeper as far as the overall depth of the wound on the left foot this also seems to have spread as far as the open wound location. It also does appear to me that the cellulitis has spread further up her leg which also has me very concerned at this point. There fortunately is no signs of active infection at this point systemically such as evidence of sepsis but nonetheless I am still concerned about the fact that again the patient does not seem to be turning around. We did have her on IV vancomycin through dialysis which they graciously help this with as well. With that being said it appears based on her culture results that I did review today that cefepime or something of the like would likely be a better option the vancomycin is not likely to help with the Serratia species that was identified. 03/28/19 Fem-Pop 04/06/19 Amp toe 19month follow-up with vascular in april 05/18/2019 on evaluation today patient presents for reevaluation here in the clinic after having been sent to the ER by myself December 2020 due to overall worsening of her condition. She did  have a femoral-popliteal bypass performed on 03/28/2019. 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 Subsequently she does have a 51-month follow-up with vascular in April but seems to be doing well from a vascular standpoint at this time. Overall the main issue for which she comes in today she does have a area on the plantar aspect of her right foot where she does have a callus there may or may not be a wound under this region. Subsequently she does have several small ulcerations on the bilateral lower extremities that are more venous stasis in nature. Continue to these do need to be addressed today as well. Fortunately there is no signs of active infection at this time. Electronic Signature(s) Signed: 05/18/2019 5:52:09 PM By: Worthy Keeler PA-C Entered By: Worthy Keeler on 05/18/2019 17:52:09 -------------------------------------------------------------------------------- Physical Exam Details Patient Name: Date of Service: HAWLEY, PAVIA 05/18/2019 2:45 PM Medical Record [id number] Patient Account Number: [id number] Date of Birth/Sex: Treating RN: 1946-03-11 (74 y.o. [name] Primary Care Provider: [name] Other Clinician: Referring Provider: Treating Provider/Extender:[name]: 52 Constitutional Well-nourished and well-hydrated in no acute distress. Respiratory normal breathing without difficulty. Psychiatric this patient is able to make decisions and demonstrates good insight into disease process. Alert and Oriented x 3.  pleasant and cooperative. Notes Upon inspection today patient's wound bed actually showed signs of good granulation at most of the locations on her lower extremities there really was no need for sharp debridement at any of the sites today which is great news. With that being said she does unfortunately have some callus on  the plantar aspect of her right foot which did require sharp debridement today. I was actually able to perform initially what I thought to be callus paring but as I got the callus removed she did have a small ulceration noted at this point. This is very superficial and I think will likely heal quite well but I am glad we did find this so we can get it treated under better control Electronic Signature(s) Signed: 05/18/2019 5:52:52 PM By: Worthy Keeler PA-C Entered By: Worthy Keeler on 05/18/2019 17:52:52 -------------------------------------------------------------------------------- Physician Orders Details Patient Name: Date of Service: [name] 05/18/2019 2:45 PM Medical Record [id number] Patient Account Number: [id number] Date of Birth/Sex: Treating RN: 16-Nov-1945 (73 y.o. [name] Primary Care Provider: [name] Other Clinician: Referring Provider: Treating Provider/Extender:Pamela III, Lemar Livings, Abelino Derrick in [name]: 12 Verbal / Phone Orders: No Diagnosis Coding ICD-10 Coding Code Description I87.2 Venous insufficiency (chronic) (peripheral) I89.0 Lymphedema, not elsewhere classified L97.812 Non-pressure chronic ulcer of other part of right lower leg with fat layer exposed L97.822 Non-pressure chronic ulcer of other part of left lower leg with fat layer exposed E11.622 Type 2 diabetes mellitus with other skin ulcer I73.89 Other specified peripheral vascular diseases N18.6 End stage renal disease Z99.2 Dependence on renal dialysis I10 Essential (primary) hypertension Follow-up Appointments Return Appointment in 1 week. Dressing Change Frequency Wound #10 Left,Medial Lower Leg Do not change entire dressing for one week. Wound #11 Right,Plantar Foot Do not change entire dressing for one week. Wound #8 Right,Anterior Lower Leg Do not change entire dressing for one week. Wound #9 Right,Medial,Posterior Lower Leg Do not change  entire dressing for one week. Wound Cleansing Wound #10 Left,Medial Lower Leg Clean wound with Normal Saline. Wound #11 Right,Plantar Foot Clean wound with Normal Saline. Wound #8 Right,Anterior Lower Leg Clean wound with Normal Saline. Wound #9 Right,Medial,Posterior Lower Leg Clean wound with Normal Saline. Primary Wound Dressing Wound #10 Left,Medial Lower Leg Calcium Alginate with Silver Wound #11 Right,Plantar Foot Calcium Alginate with Silver Wound #8 Right,Anterior Lower Leg Calcium Alginate with Silver Wound #9 Right,Medial,Posterior Lower Leg Calcium Alginate with Silver Secondary Dressing Wound #10 Left,Medial Lower Leg ABD pad Wound #11 Right,Plantar Foot ABD pad Wound #8 Right,Anterior Lower Leg ABD pad Wound #9 Right,Medial,Posterior Lower Leg ABD pad Edema Control Wound #10 Left,Medial Lower Leg Kerlix and Coban - Bilateral Wound #11 Right,Plantar Foot Kerlix and Coban - Bilateral Wound #8 Right,Anterior Lower Leg Kerlix and Coban - Bilateral Wound #9 Right,Medial,Posterior Lower Leg Kerlix and Coban - Bilateral Off-Loading Wound #11 Right,Plantar Foot Other: - Limit walking to what is recommended by physical therapy or what is necessary Electronic Signature(s) Signed: 05/18/2019 5:59:27 PM By: Worthy Keeler PA-C Entered By: Worthy Keeler on 05/18/2019 17:59:26 -------------------------------------------------------------------------------- Problem List Details Patient Name: Date of Service: [name] 05/18/2019 2:45 PM Medical Record [id number] Patient Account Number: [id number] Date of Birth/Sex: Treating RN: October 21, 1945 (74 y.o. [name] Primary Care Provider: [name] Other Clinician: Referring Provider: Treating Provider/Extender:Pamela III, Lemar Livings, Abelino Derrick in [name]: 12 Active Problems ICD-10 Evaluated Encounter Code Description Active Date Today Diagnosis I87.2 Venous insufficiency  (  chronic) (peripheral) 05/18/2019 No Yes I89.0 Lymphedema, not elsewhere classified 05/18/2019 No Yes L97.812 Non-pressure chronic ulcer of other part of right lower 05/18/2019 No Yes leg with fat layer exposed L97.822 Non-pressure chronic ulcer of other part of left lower 05/18/2019 No Yes leg with fat layer exposed E11.622 Type 2 diabetes mellitus with other skin ulcer 05/18/2019 No Yes I73.89 Other specified peripheral vascular diseases 05/18/2019 No Yes N18.6 End stage renal disease 05/18/2019 No Yes Z99.2 Dependence on renal dialysis 05/18/2019 No Yes I10 Essential (primary) hypertension 05/18/2019 No Yes Inactive Problems Resolved Problems Electronic Signature(s) Signed: 05/18/2019 4:48:16 PM By: Worthy Keeler PA-C Entered By: Worthy Keeler on 05/18/2019 16:48:16 -------------------------------------------------------------------------------- Progress Note Details Patient Name: Date of Service: [name] 05/18/2019 2:45 PM Medical Record [id number] Patient Account Number: [id number] Date of Birth/Sex: Treating RN: [birth year] (74 y.o. [name] Primary Care Provider: [name] Other Clinician: Referring Provider: Treating Provider/Extender:[name]: 12 Subjective Chief Complaint Information obtained from Patient Bilateral LE ulcers and Right plantar foot ulcer History of Present Illness (HPI) 02/23/2019 on evaluation today patient presents for initial evaluation in our clinic concerning issues that she has been having with her bilateral lower extremities as well as her plantar foot on the right. This has been quite some time in regard to the foot ulcer even back as far as the beginning of this year that is 2020. She has had the wounds on her legs a much shorter time but that still has been roughly 2-3 months. Nonetheless she is really not shown signs of a lot of improvement. She tells me that she does have  a past medical history of diabetes, venous insufficiency, lymphedema which is mild but nonetheless present, peripheral vascular disease and she has previously been recommended to have a femoral-popliteal bypass but put that off at that point they are basically just monitoring at this time. She has end-stage renal disease with dependence on renal dialysis, hypertension, and obviously wounds of the bilateral lower extremities at this point. She tells me that she is having some discomfort but fortunately nothing too significant at this point which is good news. No fevers, chills, nausea, vomiting, or diarrhea. She is mainly been leaving these areas open to air as much as possible at this point at 1 point she was trying to keep them covered more but that really did not help either. Fortunately there is no signs of active systemic nor local infection at this time. 03/02/2019 on evaluation today patient appears to be doing well with regard to her wounds. The leg ulcers which were very dry are much more moist at this point and seem to be doing great at this time. Overall I am very pleased with how things seem to be progressing. With regard to her foot ulcer it is showing some signs of slight improvement in my opinion overall there does not appear to be anything worse at least which is also good news. No fevers, chills, nausea, vomiting, or diarrhea. 03/09/2019 on evaluation today patient appears to be doing better with regard to her lower extremity ulcers a lot of the necrotic tissue is loosening up which is good news. In regard to her right plantar foot ulcer this appears to be roughly the same based on what I am seeing currently although there is less callus buildup. Unfortunately she does have an open wound area on the left lateral foot region which unfortunately probes down to bone once  this was thoroughly examined. This is something she did not even know was opened she had had a Band-Aid over it just  for protection last couple times I saw her. 03/16/19 evaluation today patient appears to be doing Someone poorly in regard to her left lower extremity. This seems to be likely infected based on what Ioom seeing at this point. Thereoos no signs of active infection at this time systemically but nonetheless thereoos a lot more erythema of the foot in general unfortunately. Her x-rays were reviewed and did not show any evidence of infection of the right foot. On the left there was a A little bit more going on but no obvious evidence of osteomyelitis though it was mentioned that if that was still a concern that number I would be the best way to further evaluate this. I think they were going to need to proceed with MRI to be honest based on what Ioom seeing today. 03/23/2019 on evaluation today patient actually appears to be doing worse compared to even last week's visit with regard to her wounds especially the left foot but even the right lower extremity. Both are measuring deeper as far as the overall depth of the wound on the left foot this also seems to have spread as far as the open wound location. It also does appear to me that the cellulitis has spread further up her leg which also has me very concerned at this point. There fortunately is no signs of active infection at this point systemically such as evidence of sepsis but nonetheless I am still concerned about the fact that again the patient does not seem to be turning around. We did have her on IV vancomycin through dialysis which they graciously help this with as well. With that being said it appears based on her culture results that I did review today that cefepime or something of the like would likely be a better option the vancomycin is not likely to help with the Serratia species that was identified. 03/28/19 Fem-Pop 04/06/19 Amp toe 85month follow-up with vascular in april 05/18/2019 on evaluation today patient presents for reevaluation  here in the clinic after having been sent to the ER by myself December 2020 due to overall worsening of her condition. She did have a femoral-popliteal bypass performed on 03/28/2019. 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 Subsequently she does have a 60-month follow-up with vascular in April but seems to be doing well from a vascular standpoint at this time. Overall the main issue for which she comes in today she does have a area on the plantar aspect of her right foot where she does have a callus there may or may not be a wound under this region. Subsequently she does have several small ulcerations on the bilateral lower extremities that are more venous stasis in nature. Continue to these do need to be addressed today as well. Fortunately there is no signs of active infection at this time. Objective Constitutional Well-nourished and well-hydrated in no acute distress. Respiratory normal breathing without difficulty. Psychiatric this patient is able to make decisions and demonstrates good insight into disease process. Alert and Oriented x 3. pleasant and cooperative. General Notes: Upon inspection today patient's wound bed actually showed signs of good granulation at most of the locations on her lower extremities there really was no need for sharp debridement at any of the sites today which is great news. With that being  said she does unfortunately have some callus on the plantar aspect of her right foot which did require sharp debridement today. I was actually able to perform initially what I thought to be callus paring but as I got the callus removed she did have a small ulceration noted at this point. This is very superficial and I think will likely heal quite well but I am glad we did find this so we can get it treated under better control Assessment Active Problems ICD-10 Venous insufficiency (chronic)  (peripheral) Lymphedema, not elsewhere classified Non-pressure chronic ulcer of other part of right lower leg with fat layer exposed Non-pressure chronic ulcer of other part of left lower leg with fat layer exposed Type 2 diabetes mellitus with other skin ulcer Other specified peripheral vascular diseases End stage renal disease Dependence on renal dialysis Essential (primary) hypertension Plan Follow-up Appointments: Return Appointment in 1 week. Dressing Change Frequency: Wound #10 Left,Medial Lower Leg: Do not change entire dressing for one week. Wound #11 Right,Plantar Foot: Do not change entire dressing for one week. Wound #8 Right,Anterior Lower Leg: Do not change entire dressing for one week. Wound #9 Right,Medial,Posterior Lower Leg: Do not change entire dressing for one week. Wound Cleansing: Wound #10 Left,Medial Lower Leg: Clean wound with Normal Saline. Wound #11 Right,Plantar Foot: Clean wound with Normal Saline. Wound #8 Right,Anterior Lower Leg: Clean wound with Normal Saline. Wound #9 Right,Medial,Posterior Lower Leg: Clean wound with Normal Saline. Primary Wound Dressing: Wound #10 Left,Medial Lower Leg: Calcium Alginate with Silver Wound #11 Right,Plantar Foot: Calcium Alginate with Silver Wound #8 Right,Anterior Lower Leg: Calcium Alginate with Silver Wound #9 Right,Medial,Posterior Lower Leg: Calcium Alginate with Silver Secondary Dressing: Wound #10 Left,Medial Lower Leg: ABD pad Wound #11 Right,Plantar Foot: ABD pad Wound #8 Right,Anterior Lower Leg: ABD pad Wound #9 Right,Medial,Posterior Lower Leg: ABD pad Edema Control: Wound #10 Left,Medial Lower Leg: Kerlix and Coban - Bilateral Wound #11 Right,Plantar Foot: Kerlix and Coban - Bilateral Wound #8 Right,Anterior Lower Leg: Kerlix and Coban - Bilateral Wound #9 Right,Medial,Posterior Lower Leg: Kerlix and Coban - Bilateral Off-Loading: Wound #11 Right,Plantar Foot: Other: - Limit  walking to what is recommended by physical therapy or what is necessary 1. My suggestion at this time is good to be that we go ahead and initiate [name] with silver alginate dressings to all wound locations. 2. I am also going to suggest the Curlex and Coban wrap to the bilateral lower extremities which I think will also be helpful for the patient. This will also prevent her from having to worry about changing the dressing on her plantar foot at this time. 3. I do recommend she try to elevate her legs as much as possible to try to help with edema control. 4. With regard to her walking program with physical therapy I think it is fine for her to ambulate through this program. With that being said I do want her just to be cautious and careful that she is not overdoing things and we will see how things are next week. We will see patient back for reevaluation in 1 week here in the clinic. If anything worsens or changes patient will contact our office for additional recommendations. Electronic Signature(s) Signed: 05/18/2019 5:59:37 PM By: Worthy Keeler PA-C Previous Signature: 05/18/2019 5:56:59 PM Version By: Worthy Keeler PA-C Entered By: Worthy Keeler on 05/18/2019 17:59:37

## 2019-05-20 DIAGNOSIS — Z992 Dependence on renal dialysis: Secondary | ICD-10-CM | POA: Diagnosis not present

## 2019-05-20 DIAGNOSIS — N186 End stage renal disease: Secondary | ICD-10-CM | POA: Diagnosis not present

## 2019-05-20 DIAGNOSIS — N2581 Secondary hyperparathyroidism of renal origin: Secondary | ICD-10-CM | POA: Diagnosis not present

## 2019-05-20 DIAGNOSIS — D631 Anemia in chronic kidney disease: Secondary | ICD-10-CM | POA: Diagnosis not present

## 2019-05-20 DIAGNOSIS — E119 Type 2 diabetes mellitus without complications: Secondary | ICD-10-CM | POA: Diagnosis not present

## 2019-05-20 DIAGNOSIS — D509 Iron deficiency anemia, unspecified: Secondary | ICD-10-CM | POA: Diagnosis not present

## 2019-05-21 DIAGNOSIS — N2581 Secondary hyperparathyroidism of renal origin: Secondary | ICD-10-CM | POA: Diagnosis not present

## 2019-05-21 DIAGNOSIS — Z4801 Encounter for change or removal of surgical wound dressing: Secondary | ICD-10-CM | POA: Diagnosis not present

## 2019-05-21 DIAGNOSIS — Z95828 Presence of other vascular implants and grafts: Secondary | ICD-10-CM | POA: Diagnosis not present

## 2019-05-21 DIAGNOSIS — M1990 Unspecified osteoarthritis, unspecified site: Secondary | ICD-10-CM | POA: Diagnosis not present

## 2019-05-21 DIAGNOSIS — Z87891 Personal history of nicotine dependence: Secondary | ICD-10-CM | POA: Diagnosis not present

## 2019-05-21 DIAGNOSIS — Z992 Dependence on renal dialysis: Secondary | ICD-10-CM | POA: Diagnosis not present

## 2019-05-21 DIAGNOSIS — E1142 Type 2 diabetes mellitus with diabetic polyneuropathy: Secondary | ICD-10-CM | POA: Diagnosis not present

## 2019-05-21 DIAGNOSIS — E1165 Type 2 diabetes mellitus with hyperglycemia: Secondary | ICD-10-CM | POA: Diagnosis not present

## 2019-05-21 DIAGNOSIS — I252 Old myocardial infarction: Secondary | ICD-10-CM | POA: Diagnosis not present

## 2019-05-21 DIAGNOSIS — N186 End stage renal disease: Secondary | ICD-10-CM | POA: Diagnosis not present

## 2019-05-21 DIAGNOSIS — E119 Type 2 diabetes mellitus without complications: Secondary | ICD-10-CM | POA: Diagnosis not present

## 2019-05-21 DIAGNOSIS — I5042 Chronic combined systolic (congestive) and diastolic (congestive) heart failure: Secondary | ICD-10-CM | POA: Diagnosis not present

## 2019-05-21 DIAGNOSIS — Z4800 Encounter for change or removal of nonsurgical wound dressing: Secondary | ICD-10-CM | POA: Diagnosis not present

## 2019-05-21 DIAGNOSIS — E1122 Type 2 diabetes mellitus with diabetic chronic kidney disease: Secondary | ICD-10-CM | POA: Diagnosis not present

## 2019-05-21 DIAGNOSIS — M109 Gout, unspecified: Secondary | ICD-10-CM | POA: Diagnosis not present

## 2019-05-21 DIAGNOSIS — E785 Hyperlipidemia, unspecified: Secondary | ICD-10-CM | POA: Diagnosis not present

## 2019-05-21 DIAGNOSIS — I051 Rheumatic mitral insufficiency: Secondary | ICD-10-CM | POA: Diagnosis not present

## 2019-05-21 DIAGNOSIS — E559 Vitamin D deficiency, unspecified: Secondary | ICD-10-CM | POA: Diagnosis not present

## 2019-05-21 DIAGNOSIS — D631 Anemia in chronic kidney disease: Secondary | ICD-10-CM | POA: Diagnosis not present

## 2019-05-21 DIAGNOSIS — I132 Hypertensive heart and chronic kidney disease with heart failure and with stage 5 chronic kidney disease, or end stage renal disease: Secondary | ICD-10-CM | POA: Diagnosis not present

## 2019-05-21 DIAGNOSIS — K5903 Drug induced constipation: Secondary | ICD-10-CM | POA: Diagnosis not present

## 2019-05-21 DIAGNOSIS — D509 Iron deficiency anemia, unspecified: Secondary | ICD-10-CM | POA: Diagnosis not present

## 2019-05-21 DIAGNOSIS — Z951 Presence of aortocoronary bypass graft: Secondary | ICD-10-CM | POA: Diagnosis not present

## 2019-05-21 DIAGNOSIS — I2510 Atherosclerotic heart disease of native coronary artery without angina pectoris: Secondary | ICD-10-CM | POA: Diagnosis not present

## 2019-05-21 DIAGNOSIS — Z4781 Encounter for orthopedic aftercare following surgical amputation: Secondary | ICD-10-CM | POA: Diagnosis not present

## 2019-05-21 DIAGNOSIS — S80821D Blister (nonthermal), right lower leg, subsequent encounter: Secondary | ICD-10-CM | POA: Diagnosis not present

## 2019-05-21 DIAGNOSIS — I447 Left bundle-branch block, unspecified: Secondary | ICD-10-CM | POA: Diagnosis not present

## 2019-05-21 DIAGNOSIS — E1151 Type 2 diabetes mellitus with diabetic peripheral angiopathy without gangrene: Secondary | ICD-10-CM | POA: Diagnosis not present

## 2019-05-23 NOTE — Progress Notes (Signed)
[name], [name] ([id number]) Visit Report for 05/18/2019 Allergy List Details Patient Name: Date of Service: [name], [name] 05/18/2019 2:45 PM Medical Record [id number] Patient Account Number: [id number] Date of Birth/Sex: Treating RN: 02/26/1946 (74 y.o. [name] Primary Care Etsuko Dierolf: [name] Other Clinician: Referring Dejae Bernet: Treating Nicola Heinemann/Extender:Stone III, Lemar Livings, Lendon Ka Weeks in Treatment: 0 Allergies Active Allergies atenolol Reaction: rash, exacerbates gout Severity: Moderate Iodinated Contrast Media Reaction: Rash Severity: Moderate iohexol Reaction: rash Severity: Moderate latex Reaction: rash Severity: Moderate Allergy Notes Electronic Signature(s) Signed: 05/23/2019 6:14:31 PM By: [name] RN, BSN Entered By: [name] on 05/18/2019 16:04:36 -------------------------------------------------------------------------------- Arrival Information Details Patient Name: Date of Service: [name] 05/18/2019 2:45 PM Medical Record [id number] Patient Account Number: [id number] Date of Birth/Sex: Treating RN: 1945/05/23 (74 y.o. [name] Primary Care Ingvald Theisen: [name] Other Clinician: Referring Ismerai Bin: Treating Zaydrian Batta/Extender: Stevphen Meuse in Treatment: 0 Visit Information Patient Arrived: Ambulatory Arrival Time: 15:28 Accompanied By: alone Transfer Assistance: None Patient Identification Verified: Yes Secondary Verification Process Yes Completed: Patient Has Alerts: Yes Patient Alerts: R ABI non compressible L ABI non compressible History Since Last Visit Added or deleted any medications: No Any new allergies or adverse reactions: No Had a fall or experienced change in activities of daily living that may affect risk of falls: No Signs or symptoms of abuse/neglect since last visito No Hospitalized since last visit: No Implantable device outside of  the clinic excluding cellular tissue based products placed in the center since last visit: No Electronic Signature(s) Signed: 05/23/2019 6:14:31 PM By: [name] RN, BSN Entered By: [name] on 05/18/2019 16:03:38 -------------------------------------------------------------------------------- Clinic Level of Care Assessment Details Patient Name: Date of Service: [name], [name] 05/18/2019 2:45 PM Medical Record [id number] Patient Account Number: [id number] Date of Birth/Sex: Treating RN: [birth year] (74 y.o. [name] Primary Care Timia Casselman: [name] Other Clinician: Referring Natally Ribera: Treating Marene Gilliam/Extender:Stone III, Lemar Livings, Lendon Ka Weeks in Treatment: 0 Clinic Level of Care Assessment Items TOOL 1 Quantity Score []  - Use when EandM and Procedure is performed on INITIAL visit 0 ASSESSMENTS - Nursing Assessment / Reassessment X - General Physical Exam (combine w/ comprehensive assessment (listed just below) 1 20 when performed on new pt. evals) X - Comprehensive Assessment (HX, ROS, Risk Assessments, Wounds Hx, etc.) 1 25 ASSESSMENTS - Wound and Skin Assessment / Reassessment []  - Dermatologic / Skin Assessment (not related to wound area) 0 ASSESSMENTS - Ostomy and/or Continence Assessment and Care []  - Incontinence Assessment and Management 0 []  - Ostomy Care Assessment and Management (repouching, etc.) 0 PROCESS - Coordination of Care X - Simple Patient / Family Education for ongoing care 1 15 []  - Complex (extensive) Patient / Family Education for ongoing care 0 X - Staff obtains Programmer, systems, Records, Test Results / Process Orders 1 10 X - Staff telephones HHA, Nursing Homes / Clarify orders / etc 1 10 []  - Routine Transfer to another Facility (non-emergent condition) 0 []  - Routine Hospital Admission (non-emergent condition) 0 X - New Admissions / Biomedical engineer / Ordering NPWT, Apligraf, etc. 1 15 []  - Emergency  Hospital Admission (emergent condition) 0 PROCESS - Special Needs []  - Pediatric / Minor Patient Management 0 []  - Isolation Patient Management 0 []  - Hearing / Language / Visual special needs 0 []  - Assessment of Community assistance (transportation, D/C planning, etc.) 0 []  - Additional assistance / Altered mentation 0 []  - Support Surface(s) Assessment (bed, cushion, seat,  etc.) 0 INTERVENTIONS - Miscellaneous []  - External ear exam 0 []  - Patient Transfer (multiple staff / Civil Service fast streamer / Similar devices) 0 []  - Simple Staple / Suture removal (25 or less) 0 []  - Complex Staple / Suture removal (26 or more) 0 []  - Hypo/Hyperglycemic Management (do not check if billed separately) 0 []  - Ankle / Brachial Index (ABI) - do not check if billed separately 0 Has the patient been seen at the hospital within the last three years: Yes Total Score: 95 Level Of Care: New/Established - Level 3 Electronic Signature(s) Signed: 05/18/2019 5:39:41 PM By: Baruch Gouty RN, BSN Entered By: Baruch Gouty on 05/18/2019 17:06:59 -------------------------------------------------------------------------------- Encounter Discharge Information Details Patient Name: Date of Service: [name] 05/18/2019 2:45 PM Medical Record [id number] Patient Account Number: [id number] Date of Birth/Sex: Treating RN: 01/14/1946 (74 y.o. Debby Bud Primary Care Blayn Whetsell: [name] Other Clinician: Referring Andriel Omalley: Treating Tywan Siever/Extender:Stone III, Lemar Livings, Abelino Derrick in Treatment: 0 Encounter Discharge Information Items Post Procedure Vitals Discharge Condition: Stable Temperature (F): 98.3 Ambulatory Status: Ambulatory Pulse (bpm): 105 Discharge Destination: Home Respiratory Rate (breaths/min): 16 Transportation: Private Auto Blood Pressure (mmHg): 133/53 Accompanied By: self Schedule Follow-up Appointment: Yes Clinical Summary of Care: Electronic  Signature(s) Signed: 05/19/2019 5:57:11 PM By: Deon Pilling Entered By: Deon Pilling on 05/18/2019 17:16:06 -------------------------------------------------------------------------------- Lower Extremity Assessment Details Patient Name: Date of Service: [name], [name] 05/18/2019 2:45 PM Medical Record [id number] Patient Account Number: [id number] Date of Birth/Sex: Treating RN: [birth year] (74 y.o. [name] Primary Care Nikea Settle: [name] Other Clinician: Referring Aniaya Bacha: Treating Cloud Graham/Extender:Stone III, Lemar Livings, Lendon Ka Weeks in Treatment: 0 Edema Assessment Assessed: [Left: No] [Right: No] Edema: [Left: Yes] [Right: Yes] Calf Left: Right: Point of Measurement: 31 cm From Medial Instep 31 cm 33 cm Ankle Left: Right: Point of Measurement: 11 cm From Medial Instep 19.6 cm 19.8 cm Vascular Assessment Pulses: Dorsalis Pedis Palpable: [Left:No] [Right:No] Electronic Signature(s) Signed: 05/23/2019 6:14:31 PM By: [name] RN, BSN Entered By: [name] on 05/18/2019 16:10:09 -------------------------------------------------------------------------------- [place] Details Patient Name: Date of Service: [name] 05/18/2019 2:45 PM Medical Record [id number] Patient Account Number: [id number] Date of Birth/Sex: Treating RN: 1945-11-26 (74 y.o. [name] Primary Care Axle Parfait: [name] Other Clinician: Referring Dimitris Shanahan: Treating Elexia Friedt/Extender:Stone III, Lemar Livings, Lendon Ka Weeks in Treatment: 0 Active Inactive Nutrition Nursing Diagnoses: Impaired glucose control: actual or potential Potential for alteratiion in Nutrition/Potential for imbalanced nutrition Goals: Patient/caregiver will maintain therapeutic glucose control Date Initiated: 05/18/2019 Target Resolution Date: 06/15/2019 Goal Status: Active Interventions: Assess HgA1c results as ordered upon admission  and as needed Assess patient nutrition upon admission and as needed per policy Treatment Activities: Patient referred to Primary Care Physician for further nutritional evaluation : 05/18/2019 Notes: Wound/Skin Impairment Nursing Diagnoses: Impaired tissue integrity Knowledge deficit related to ulceration/compromised skin integrity Goals: Patient/caregiver will verbalize understanding of skin care regimen Date Initiated: 05/18/2019 Target Resolution Date: 06/15/2019 Goal Status: Active Ulcer/skin breakdown will have a volume reduction of 30% by week 4 Date Initiated: 05/18/2019 Target Resolution Date: 06/15/2019 Goal Status: Active Interventions: Assess patient/caregiver ability to obtain necessary supplies Assess patient/caregiver ability to perform ulcer/skin care regimen upon admission and as needed Assess ulceration(s) every visit Provide education on ulcer and skin care Treatment Activities: Skin care regimen initiated : 05/18/2019 Topical wound management initiated : 05/18/2019 Notes: Electronic Signature(s) Signed: 05/18/2019 5:39:41 PM By: Baruch Gouty RN, BSN Entered By: Baruch Gouty on 05/18/2019  16:55:36 -------------------------------------------------------------------------------- Pain Assessment Details Patient Name: Date of Service: [name], [name] 05/18/2019 2:45 PM Medical Record [id number] Patient Account Number: [id number] Date of Birth/Sex: Treating RN: [birth year] (74 y.o. [name] Primary Care Rovena Hearld: [name] Other Clinician: Referring Fumiko Cham: Treating Ameyah Bangura/Extender:Stone III, Lemar Livings, Lendon Ka Weeks in Treatment: 0 Active Problems Location of Pain Severity and Description of Pain Patient Has Paino No Site Locations Pain Management and Medication Current Pain Management: Electronic Signature(s) Signed: 05/23/2019 6:14:31 PM By: [name] RN, BSN Entered By: [name] on 05/18/2019  16:14:53 -------------------------------------------------------------------------------- Patient/Caregiver Education Details Patient Name: Date of Service: [name] 2/10/2021andnbsp2:45 PM Medical Record [id number] Patient Account Number: [id number] Date of Birth/Gender: Treating RN: 13-Dec-1945 (73 y.o. [name] Primary Care Physician: [name] Other Clinician: Referring Physician: Treating Physician/Extender:Stone III, Lemar Livings, Abelino Derrick in Treatment: 0 Education Assessment Education Provided To: Patient Education Topics Provided Elevated Blood Sugar/ Impact on Healing: Methods: Explain/Verbal Responses: Reinforcements needed, State content correctly Offloading: Methods: Explain/Verbal Responses: Reinforcements needed, State content correctly Wound/Skin Impairment: Methods: Explain/Verbal Responses: Reinforcements needed, State content correctly Electronic Signature(s) Signed: 05/18/2019 5:39:41 PM By: Baruch Gouty RN, BSN Entered By: Baruch Gouty on 05/18/2019 16:56:16 -------------------------------------------------------------------------------- Wound Assessment Details Patient Name: Date of Service: [name] 05/18/2019 2:45 PM Medical Record [id number] Patient Account Number: [id number] Date of Birth/Sex: Treating RN: 1946/03/14 (74 y.o. [name], Vaughan Basta Primary Care Shiryl Ruddy: [name] Other Clinician: Referring Mischa Pollard: Treating Allisen Pidgeon/Extender:Stone III, Lemar Livings, Lendon Ka Weeks in Treatment: 0 Wound Status Wound Number: 10 Primary Venous Leg Ulcer Etiology: Wound Location: Left, Medial Lower Leg Wound Open Wounding Event: Gradually Appeared Status: Date Acquired: 05/18/2019 Comorbid Cataracts, Anemia, Asthma, Congestive Heart Weeks Of Treatment: 0 History: Failure, Coronary Artery Disease, Clustered Wound: Yes Hypertension, Peripheral Arterial Disease, Peripheral Venous  Disease, Type II Diabetes, End Stage Renal Disease, Osteoarthritis, Osteomyelitis, Neuropathy, Received Radiation Photos Wound Measurements Length: (cm) 1.4 % R Width: (cm) 1 % R Depth: (cm) 0.1 Epi Clustered Quantity: 2 Tun Area: (cm) 1.1 Un Volume: (cm) 0.11 Wound Description Classification: Partial Thickness Wound Margin: Flat and Intact Exudate Amount: Small Exudate Type: Serosanguineous Exudate Color: red, brown Wound Bed Granulation Amount: Large (67-100%) Granulation Quality: Pink Necrotic Amount: None Present (0%) Foul Odor After Cleansing: No Slough/Fibrino No Exposed Structure Fascia Exposed: No Fat Layer (Subcutaneous Tissue) Exposed: No Tendon Exposed: No Muscle Exposed: No Joint Exposed: No Bone Exposed: No eduction in Area: 0% eduction in Volume: 0% thelialization: None neling: No dermining: No Treatment Notes Wound #10 (Left, Medial Lower Leg) 1. Cleanse With Wound Cleanser 2. Periwound Care Moisturizing lotion 3. Primary Dressing Applied Calcium Alginate Ag 4. Secondary Dressing Dry Gauze 6. Support Layer Applied Kerlix/Coban Notes netting. foam at upper portion of lower leg for protection. Electronic Signature(s) Signed: 05/19/2019 4:30:47 PM By: Mikeal Hawthorne EMT/HBOT Signed: 05/20/2019 5:30:09 PM By: Baruch Gouty RN, BSN Entered By: Mikeal Hawthorne on 05/19/2019 13:51:51 -------------------------------------------------------------------------------- Wound Assessment Details Patient Name: Date of Service: [name] 05/18/2019 2:45 PM Medical Record [id number] Patient Account Number: [id number] Date of Birth/Sex: Treating RN: March 16, 1946 (74 y.o. [name] Primary Care Garlin Batdorf: [name] Other Clinician: Referring Jhoselin Crume: Treating Jibri Schriefer/Extender:Stone III, Lemar Livings, Lendon Ka Weeks in Treatment: 0 Wound Status Wound Number: 11 Primary Diabetic Wound/Ulcer of the Lower  Extremity Etiology: Wound Location: Right Foot - Plantar Wound Open Wounding Event: Gradually Appeared Status: Date Acquired: 05/18/2019 Comorbid Cataracts, Anemia, Asthma, Congestive Heart Weeks Of Treatment: 0  History: Failure, Coronary Artery Disease, Clustered Wound: No Hypertension, Peripheral Arterial Disease, Peripheral Venous Disease, Type II Diabetes, End Stage Renal Disease, Osteoarthritis, Osteomyelitis, Neuropathy, Received Radiation Wound Measurements Length: (cm) 0.7 % Reduction in Ar Width: (cm) 0.1 % Reduction in Vo Depth: (cm) 0.1 Epithelialization Area: (cm) 0.055 Tunneling: Volume: (cm) 0.005 Undermining: Wound Description Classification: Grade 1 Foul Odor After C Wound Margin: Flat and Intact Slough/Fibrino Exudate Amount: Small Exudate Type: Serous Exudate Color: amber Wound Bed Granulation Amount: Large (67-100%) Granulation Quality: Red Fascia Exposed: Necrotic Amount: None Present (0%) Fat Layer (Subcut Tendon Exposed: Muscle Exposed: Joint Exposed: Bone Exposed: leansing: No No Exposed Structure No aneous Tissue) Exposed: Yes No No No No ea: lume: : Small (1-33%) No No Treatment Notes Wound #11 (Right, Plantar Foot) 1. Cleanse With Wound Cleanser 2. Periwound Care Moisturizing lotion 3. Primary Dressing Applied Calcium Alginate Ag 4. Secondary Dressing Dry Gauze Foam 6. Support Layer Applied Kerlix/Coban Notes foam donut as secondary. netting. Electronic Signature(s) Signed: 05/18/2019 5:39:41 PM By: Baruch Gouty RN, BSN Entered By: Baruch Gouty on 05/18/2019 16:58:35 -------------------------------------------------------------------------------- Wound Assessment Details Patient Name: Date of Service: [name] 05/18/2019 2:45 PM Medical Record [id number] Patient Account Number: [id number] Date of Birth/Sex: Treating RN: 1945/08/29 (74 y.o. [name], Vaughan Basta Primary Care Erskin Zinda: [name] Other Clinician: Referring Eleny Cortez: Treating Ange Puskas/Extender:Stone III, Lemar Livings, Lendon Ka Weeks in Treatment: 0 Wound Status Wound Number: 8 Primary Venous Leg Ulcer Etiology: Wound Location: Right Lower Leg - Anterior Wound Open Wounding Event: Gradually Appeared Status: Date Acquired: 12/07/2018 Comorbid Cataracts, Anemia, Asthma, Congestive Heart Weeks Of Treatment: 0 History: Failure, Coronary Artery Disease, Clustered Wound: No Hypertension, Peripheral Arterial Disease, Peripheral Venous Disease, Type II Diabetes, End Stage Renal Disease, Osteoarthritis, Osteomyelitis, Neuropathy, Received Radiation Photos Wound Measurements Length: (cm) 1.2 % Reduct Width: (cm) 0.9 % Reduct Depth: (cm) 0.1 Epitheli Area: (cm) 0.848 Tunneli Volume: (cm) 0.085 Undermi Wound Description Full Thickness Without Exposed Support Foul Od Classification: Structures Classification: Structures Sl Wound Flat and Intact Margin: Exudate Medium Amount: Exudate Serosanguineous Type: Exudate red, brown Color: Wound Bed Granulation Amount: Large (67-100%) Granulation Quality: Red Fas Necrotic Amount: None Present (0%) Fat Ten Mus Joi Bon or After Cleansing: No ough/Fibrino No Exposed Structure cia Exposed: No Layer (Subcutaneous Tissue) Exposed: Yes don Exposed: No cle Exposed: No nt Exposed: No e Exposed: No ion in Area: 0% ion in Volume: 0% alization: None ng: No ning: No Treatment Notes Wound #8 (Right, Anterior Lower Leg) 1. Cleanse With Wound Cleanser 2. Periwound Care Moisturizing lotion 3. Primary Dressing Applied Calcium Alginate Ag 4. Secondary Dressing Dry Gauze 6. Support Layer Applied Kerlix/Coban Notes netting. foam at upper portion of lower leg for protection. Electronic Signature(s) Signed: 05/19/2019 4:30:47 PM By: Mikeal Hawthorne EMT/HBOT Signed: 05/20/2019 5:30:09 PM By: Baruch Gouty RN, BSN Entered By: Mikeal Hawthorne on  05/19/2019 13:51:00 -------------------------------------------------------------------------------- Wound Assessment Details Patient Name: Date of Service: [name] 05/18/2019 2:45 PM Medical Record [id number] Patient Account Number: [id number] Date of Birth/Sex: Treating RN: [birth year] (74 y.o. [name] Primary Care Labrea Eccleston: [name] Other Clinician: Referring Liylah Najarro: Treating Valkyrie Guardiola/Extender:Stone III, Lemar Livings, Lendon Ka Weeks in Treatment: 0 Wound Status Wound Number: 9 Primary Venous Leg Ulcer Etiology: Wound Location: Right Lower Leg - Medial, Posterior Wound Open Status: Wounding Event: Gradually Appeared Comorbid Cataracts, Anemia, Asthma, Congestive Heart Date Acquired: 12/07/2018 History: Failure, Coronary Artery Disease, Weeks Of Treatment: 0 Hypertension, Peripheral Arterial Disease, Clustered Wound: No Peripheral Venous  Disease, Type II Diabetes, End Stage Renal Disease, Osteoarthritis, Osteomyelitis, Neuropathy, Received Radiation Photos Wound Measurements Length: (cm) 0.8 % Reduct Width: (cm) 0.5 % Reduct Depth: (cm) 0.1 Epitheli Area: (cm) 0.314 Tunneli Volume: (cm) 0.031 Undermi Wound Description Classification: Full Thickness Without Exposed Support Foul Od Structures Slough/ Wound Flat and Intact Margin: Exudate Small Amount: Exudate Serosanguineous Type: Exudate red, brown Color: Wound Bed Granulation Amount: Large (67-100%) Granulation Quality: Pink Fascia E Necrotic Amount: Small (1-33%) Fat Laye Necrotic Quality: Adherent Slough Tendon E Muscle E Joint Ex Bone Exp or After Cleansing: No Fibrino Yes Exposed Structure xposed: No r (Subcutaneous Tissue) Exposed: Yes xposed: No xposed: No posed: No osed: No ion in Area: 0% ion in Volume: 0% alization: None ng: No ning: No Treatment Notes Wound #9 (Right, Medial, Posterior Lower Leg) 1. Cleanse With Wound Cleanser 2. Periwound  Care Moisturizing lotion 3. Primary Dressing Applied Calcium Alginate Ag 4. Secondary Dressing Dry Gauze 6. Support Layer Applied Kerlix/Coban Notes netting. foam at upper portion of lower leg for protection. Electronic Signature(s) Signed: 05/19/2019 4:30:47 PM By: Mikeal Hawthorne EMT/HBOT Signed: 05/20/2019 5:30:09 PM By: Baruch Gouty RN, BSN Entered By: Mikeal Hawthorne on 05/19/2019 13:51:20 -------------------------------------------------------------------------------- Vitals Details Patient Name: Date of Service: [name] 05/18/2019 2:45 PM Medical Record [id number] Patient Account Number: [id number] Date of Birth/Sex: Treating RN: [birth year] (74 y.o. [name] Primary Care Kyia Rhude: [name] Other Clinician: Referring Deigo Alonso: Treating Sinjin Amero/Extender:Stone III, Lemar Livings, Lendon Ka Weeks in Treatment: 0 Vital Signs Time Taken: 15:30 Temperature (F): 98.3 Height (in): 64 Pulse (bpm): 105 Source: Stated Respiratory Rate (breaths/min): 16 Weight (lbs): 160 Blood Pressure (mmHg): 133/53 Source: Stated Reference Range: 80 - 120 mg / dl Body Mass Index (BMI): 27.5 Electronic Signature(s) Signed: 05/23/2019 6:14:31 PM By: [name] RN, BSN Entered By: [name] on 05/18/2019 16:04:21

## 2019-05-23 NOTE — Progress Notes (Signed)
Pamela, [name] ([id number]) Visit Report for 05/18/2019 Abuse/Suicide Risk Screen Details Patient Name: Date of Service: Pamela, [name] 05/18/2019 2:45 PM Medical Record [id number] Patient Account Number: [id number] Date of Birth/Sex: Treating RN: Dec 26, 1945 (74 y.o. [name] Primary Care Burgundy Matuszak: [name] Other Clinician: Referring Tayleigh Wetherell: Treating Nealy Karapetian/Extender:Stone III, Lemar Livings, Lendon Ka Weeks in Treatment: 0 Abuse/Suicide Risk Screen Items Answer ABUSE RISK SCREEN: Has anyone close to you tried to hurt or harm you recentlyo No Do you feel uncomfortable with anyone in your familyo No Has anyone forced you do things that you didnt want to doo No Electronic Signature(s) Signed: 05/23/2019 6:14:31 PM By: [name] RN, BSN Entered By: [name] on 05/18/2019 16:07:33 -------------------------------------------------------------------------------- Activities of Daily Living Details Patient Name: Date of Service: Pamela, [name] 05/18/2019 2:45 PM Medical Record [id number] Patient Account Number: [id number] Date of Birth/Sex: Treating RN: 1945-10-16 (74 y.o. [name] Primary Care Lenah Messenger: [name] Other Clinician: Referring Adarian Bur: Treating Sheridyn Canino/Extender:Stone III, Lemar Livings, Lendon Ka Weeks in Treatment: 0 Activities of Daily Living Items Answer Activities of Daily Living (Please select one for each item) Drive Automobile Completely Able Take Medications Completely Able Use Telephone Completely Able Care for Appearance Completely Able Use Toilet Completely Able Bath / Shower Completely Able Dress Self Completely Able Feed Self Completely Able Walk Completely Able Get In / Out Bed Completely Able Housework Completely Able Prepare Meals Completely [place] for Self Completely Able Electronic Signature(s) Signed: 05/23/2019 6:14:31 PM By: [name]  RN, BSN Entered By: [name] on 05/18/2019 16:07:53 -------------------------------------------------------------------------------- Education Screening Details Patient Name: Date of Service: [name] 05/18/2019 2:45 PM Medical Record [id number] Patient Account Number: [id number] Date of Birth/Sex: Treating RN: [birth year] (74 y.o. [name] Primary Care Blong Busk: [name] Other Clinician: Referring Graciana Sessa: Treating Nidhi Jacome/Extender:Stone III, Lemar Livings, Abelino Derrick in Treatment: 0 Primary Learner Assessed: Patient Learning Preferences/Education Level/Primary Language Learning Preference: Explanation, Demonstration, Printed Material Highest Education Level: College or Above Preferred Language: English Cognitive Barrier Language Barrier: No Translator Needed: No Memory Deficit: No Emotional Barrier: No Cultural/Religious Beliefs Affecting Medical Care: No Physical Barrier Impaired Vision: No Impaired Hearing: No Decreased Hand dexterity: No Knowledge/Comprehension Knowledge Level: High Comprehension Level: High Ability to understand written High instructions: Ability to understand verbal High instructions: Motivation Anxiety Level: Calm Cooperation: Cooperative Education Importance: Acknowledges Need Interest in Health Problems: Asks Questions Perception: Coherent Willingness to Engage in Self- High Management Activities: Readiness to Engage in Self- High Management Activities: Electronic Signature(s) Signed: 05/23/2019 6:14:31 PM By: [name] RN, BSN Entered By: [name] on 05/18/2019 16:08:15 -------------------------------------------------------------------------------- Fall Risk Assessment Details Patient Name: Date of Service: [name], Pamela J. 05/18/2019 2:45 PM Medical Record [id number] Patient Account Number: [id number] Date of Birth/Sex: Treating RN: 1945-07-24 (74 y.o. [name] Primary Care Jawan Chavarria: [name] Other Clinician: Referring Tashari Schoenfelder: Treating Sherard Sutch/Extender:Stone III, Lemar Livings, Lendon Ka Weeks in Treatment: 0 Fall Risk Assessment Items Have you had 2 or more falls in the last 12 monthso 0 No Have you had any fall that resulted in injury in the last 12 monthso 0 No FALLS RISK SCREEN History of falling - immediate or within 3 months 0 No Secondary diagnosis (Do you have 2 or more medical diagnoseso) 15 Yes Ambulatory aid None/bed rest/wheelchair/nurse 0 Yes Crutches/cane/walker 0 No Furniture 0 No Intravenous therapy Access/Saline/Heparin Lock 0 No Weak (short steps with or without shuffle, stooped but able to  lift head 0 No while walking, may seek support from furniture) Impaired (short steps with shuffle, may have difficulty arising from chair, 0 No head down, impaired balance) Mental Status Oriented to own ability 0 Yes Overestimates or forgets limitations 0 No Risk Level: Low Risk Score: 15 Electronic Signature(s) Signed: 05/23/2019 6:14:31 PM By: [name] RN, BSN Entered By: [name] on 05/18/2019 16:08:27 -------------------------------------------------------------------------------- Foot Assessment Details Patient Name: Date of Service: [name], Pamela J. 05/18/2019 2:45 PM Medical Record [id number] Patient Account Number: [id number] Date of Birth/Sex: Treating RN: 1945/09/16 (74 y.o. [name] Primary Care Rashea Hoskie: [name] Other Clinician: Referring Ijeoma Loor: Treating Almina Schul/Extender:Stone III, Lemar Livings, Lendon Ka Weeks in Treatment: 0 Foot Assessment Items Site Locations + = Sensation present, - = Sensation absent, C = Callus, U = Ulcer R = Redness, W = Warmth, M = Maceration, PU = Pre-ulcerative lesion F = Fissure, S = Swelling, D = Dryness Assessment Right: Left: Other Deformity: No No Prior Foot Ulcer: No No Prior Amputation: No No Charcot Joint: No  No Ambulatory Status: Ambulatory Without Help Gait: Steady Electronic Signature(s) Signed: 05/23/2019 6:14:31 PM By: [name] RN, BSN Entered By: [name] on 05/18/2019 16:08:55 -------------------------------------------------------------------------------- Nutrition Risk Screening Details Patient Name: Date of Service: [name], Pamela J. 05/18/2019 2:45 PM Medical Record [id number] Patient Account Number: [id number] Date of Birth/Sex: Treating RN: 08/08/1945 (74 y.o. [name] Primary Care Janathan [name]: [name] Other Clinician: Referring Levi Klaiber: Treating Jessel Gettinger/Extender:Stone III, Lemar Livings, Lendon Ka Weeks in Treatment: 0 Height (in): 64 Weight (lbs): 160 Body Mass Index (BMI): 27.5 Nutrition Risk Screening Items Score Screening NUTRITION RISK SCREEN: I have an illness or condition that made me change the kind and/or 2 Yes amount of food I eat I eat fewer than two meals per day 0 No I eat few fruits and vegetables, or milk products 0 No I have three or more drinks of beer, liquor or wine almost every day 0 No I have tooth or mouth problems that make it hard for me to eat 0 No I don't always have enough money to buy the food I need 0 No I eat alone most of the time 0 No I take three or more different prescribed or over-the-counter drugs a day 1 Yes 0 No Without wanting to, I have lost or gained 10 pounds in the last six months I am not always physically able to shop, cook and/or feed myself 2 Yes Nutrition Protocols Good Risk Protocol Provide education on Moderate Risk Protocol 0 nutrition High Risk Proctocol Risk Level: Moderate Risk Score: 5 Electronic Signature(s) Signed: 05/23/2019 6:14:31 PM By: [name] RN, BSN Entered By: [name] on 05/18/2019 16:08:34

## 2019-05-24 DIAGNOSIS — Z992 Dependence on renal dialysis: Secondary | ICD-10-CM | POA: Diagnosis not present

## 2019-05-24 DIAGNOSIS — N186 End stage renal disease: Secondary | ICD-10-CM | POA: Diagnosis not present

## 2019-05-24 DIAGNOSIS — D631 Anemia in chronic kidney disease: Secondary | ICD-10-CM | POA: Diagnosis not present

## 2019-05-24 DIAGNOSIS — D509 Iron deficiency anemia, unspecified: Secondary | ICD-10-CM | POA: Diagnosis not present

## 2019-05-24 DIAGNOSIS — N2581 Secondary hyperparathyroidism of renal origin: Secondary | ICD-10-CM | POA: Diagnosis not present

## 2019-05-24 DIAGNOSIS — E119 Type 2 diabetes mellitus without complications: Secondary | ICD-10-CM | POA: Diagnosis not present

## 2019-05-25 ENCOUNTER — Encounter (HOSPITAL_BASED_OUTPATIENT_CLINIC_OR_DEPARTMENT_OTHER): Payer: Medicare Other | Admitting: Physician Assistant

## 2019-05-25 ENCOUNTER — Encounter: Payer: Self-pay | Admitting: Orthopaedic Surgery

## 2019-05-25 ENCOUNTER — Ambulatory Visit (INDEPENDENT_AMBULATORY_CARE_PROVIDER_SITE_OTHER): Payer: Medicare Other

## 2019-05-25 ENCOUNTER — Ambulatory Visit (INDEPENDENT_AMBULATORY_CARE_PROVIDER_SITE_OTHER): Payer: Medicare Other | Admitting: Orthopaedic Surgery

## 2019-05-25 ENCOUNTER — Ambulatory Visit: Payer: Medicare Other | Admitting: Family

## 2019-05-25 ENCOUNTER — Other Ambulatory Visit: Payer: Self-pay

## 2019-05-25 ENCOUNTER — Other Ambulatory Visit (HOSPITAL_COMMUNITY)
Admission: RE | Admit: 2019-05-25 | Discharge: 2019-05-25 | Disposition: A | Discharge status: Home / Self Care | Payer: Medicare Other | Source: Ambulatory Visit | Attending: Orthopaedic Surgery | Admitting: Orthopaedic Surgery

## 2019-05-25 ENCOUNTER — Ambulatory Visit: Payer: Medicare Other | Admitting: Orthopaedic Surgery

## 2019-05-25 DIAGNOSIS — M79622 Pain in left upper arm: Secondary | ICD-10-CM

## 2019-05-25 DIAGNOSIS — I132 Hypertensive heart and chronic kidney disease with heart failure and with stage 5 chronic kidney disease, or end stage renal disease: Secondary | ICD-10-CM | POA: Diagnosis not present

## 2019-05-25 DIAGNOSIS — Z4781 Encounter for orthopedic aftercare following surgical amputation: Secondary | ICD-10-CM | POA: Diagnosis not present

## 2019-05-25 DIAGNOSIS — L97212 Non-pressure chronic ulcer of right calf with fat layer exposed: Secondary | ICD-10-CM | POA: Diagnosis not present

## 2019-05-25 DIAGNOSIS — Z20822 Contact with and (suspected) exposure to covid-19: Secondary | ICD-10-CM | POA: Insufficient documentation

## 2019-05-25 DIAGNOSIS — S43015A Anterior dislocation of left humerus, initial encounter: Secondary | ICD-10-CM

## 2019-05-25 DIAGNOSIS — M25532 Pain in left wrist: Secondary | ICD-10-CM | POA: Diagnosis not present

## 2019-05-25 DIAGNOSIS — L97822 Non-pressure chronic ulcer of other part of left lower leg with fat layer exposed: Secondary | ICD-10-CM | POA: Diagnosis not present

## 2019-05-25 DIAGNOSIS — Z01812 Encounter for preprocedural laboratory examination: Secondary | ICD-10-CM | POA: Insufficient documentation

## 2019-05-25 DIAGNOSIS — L97812 Non-pressure chronic ulcer of other part of right lower leg with fat layer exposed: Secondary | ICD-10-CM | POA: Diagnosis not present

## 2019-05-25 DIAGNOSIS — E11621 Type 2 diabetes mellitus with foot ulcer: Secondary | ICD-10-CM | POA: Diagnosis not present

## 2019-05-25 DIAGNOSIS — E1122 Type 2 diabetes mellitus with diabetic chronic kidney disease: Secondary | ICD-10-CM | POA: Diagnosis not present

## 2019-05-25 DIAGNOSIS — I890 Lymphedema, not elsewhere classified: Secondary | ICD-10-CM | POA: Diagnosis not present

## 2019-05-25 DIAGNOSIS — I872 Venous insufficiency (chronic) (peripheral): Secondary | ICD-10-CM | POA: Diagnosis not present

## 2019-05-25 DIAGNOSIS — I2510 Atherosclerotic heart disease of native coronary artery without angina pectoris: Secondary | ICD-10-CM | POA: Diagnosis not present

## 2019-05-25 DIAGNOSIS — L97829 Non-pressure chronic ulcer of other part of left lower leg with unspecified severity: Secondary | ICD-10-CM | POA: Diagnosis not present

## 2019-05-25 DIAGNOSIS — L97512 Non-pressure chronic ulcer of 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d: Secondary | ICD-10-CM | POA: Diagnosis not present

## 2019-05-25 DIAGNOSIS — I5042 Chronic combined systolic (congestive) and diastolic (congestive) heart failure: Secondary | ICD-10-CM | POA: Diagnosis not present

## 2019-05-25 DIAGNOSIS — I252 Old myocardial infarction: Secondary | ICD-10-CM | POA: Diagnosis not present

## 2019-05-25 LAB — SARS CORONAVIRUS 2 (TAT 6-24 HRS): SARS Coronavirus 2: NEGATIVE

## 2019-05-25 NOTE — Progress Notes (Addendum)
[name], [name] ([id number]) Visit Report for 05/25/2019 Chief Complaint Document Details Patient Name: Date of Service: [name], [name] 05/25/2019 1:15 PM Medical Record [id number] Patient Account Number: [id number] Date of Birth/Sex: Treating RN: 01-13-1946 (74 y.o. [name] Primary Care Provider: [name] Other Clinician: Referring Provider: Treating Provider/Extender:Stone III, Lemar Livings, Lendon Ka Weeks in Treatment: 1 Information Obtained from: Patient Chief Complaint Bilateral LE ulcers and Right plantar foot ulcer Electronic Signature(s) Signed: 05/25/2019 1:51:53 PM By: Worthy Keeler PA-C Entered By: Worthy Keeler on 05/25/2019 13:51:53 -------------------------------------------------------------------------------- HPI Details Patient Name: Date of Service: [name] 05/25/2019 1:15 PM Medical Record [id number] Patient Account Number: [id number] Date of Birth/Sex: Treating RN: [birth year] (74 y.o. [name] Primary Care Provider: [name] Other Clinician: Referring Provider: Treating Provider/Extender:Stone III, Lemar Livings, Lendon Ka Weeks in Treatment: 1 History of Present Illness HPI Description: 02/23/2019 on evaluation today patient presents for initial evaluation in our clinic concerning issues that she has been having with her bilateral lower extremities as well as her plantar foot on the right. This has been quite some time in regard to the foot ulcer even back as far as the beginning of this year that is 2020. She has had the wounds on her legs a much shorter time but that still has been roughly 2-3 months. Nonetheless she is really not shown signs of a lot of improvement. She tells me that she does have a past medical history of diabetes, venous insufficiency, lymphedema which is mild but nonetheless present, peripheral vascular disease and she has previously been recommended to have a  femoral-popliteal bypass but put that off at that point they are basically just monitoring at this time. She has end-stage renal disease with dependence on renal dialysis, hypertension, and obviously wounds of the bilateral lower extremities at this point. She tells me that she is having some discomfort but fortunately nothing too significant at this point which is good news. No fevers, chills, nausea, vomiting, or diarrhea. She is mainly been leaving these areas open to air as much as possible at this point at 1 point she was trying to keep them covered more but that really did not help either. Fortunately there is no signs of active systemic nor local infection at this time. 03/02/2019 on evaluation today patient appears to be doing well with regard to her wounds. The leg ulcers which were very dry are much more moist at this point and seem to be doing great at this time. Overall I am very pleased with how things seem to be progressing. With regard to her foot ulcer it is showing some signs of slight improvement in my opinion overall there does not appear to be anything worse at least which is also good news. No fevers, chills, nausea, vomiting, or diarrhea. 03/09/2019 on evaluation today patient appears to be doing better with regard to her lower extremity ulcers a lot of the necrotic tissue is loosening up which is good news. In regard to her right plantar foot ulcer this appears to be roughly the same based on what I am seeing currently although there is less callus buildup. Unfortunately she does have an open wound area on the left lateral foot region which unfortunately probes down to bone once this was thoroughly examined. This is something she did not even know was opened she had had a Band-Aid over it just for protection last couple times I saw her. 03/16/19 evaluation today patient appears to  be doing Someone poorly in regard to her left lower extremity. This seems to be likely infected  based on what Im seeing at this point. Theres no signs of active infection at this time systemically but nonetheless theres a lot more erythema of the foot in general unfortunately. Her x-rays were reviewed and did not show any evidence of infection of the right foot. On the left there was a A little bit more going on but no obvious evidence of osteomyelitis though it was mentioned that if that was still a concern that number I would be the best way to further evaluate this. I think they were going to need to proceed with MRI to be honest based on what Im seeing today. 03/23/2019 on evaluation today patient actually appears to be doing worse compared to even last week's visit with regard to her wounds especially the left foot but even the right lower extremity. Both are measuring deeper as far as the overall depth of the wound on the left foot this also seems to have spread as far as the open wound location. It also does appear to me that the cellulitis has spread further up her leg which also has me very concerned at this point. There fortunately is no signs of active infection at this point systemically such as evidence of sepsis but nonetheless I am still concerned about the fact that again the patient does not seem to be turning around. We did have her on IV vancomycin through dialysis which they graciously help this with as well. With that being said it appears based on her culture results that I did review today that cefepime or something of the like would likely be a better option the vancomycin is not likely to help with the Serratia species that was identified. 03/28/19 Fem-Pop 04/06/19 Amp toe 39month follow-up with vascular in april 05/18/2019 on evaluation today patient presents for reevaluation here in the clinic after having been sent to the ER by myself December 2020 due to overall worsening of her condition. She did have a femoral-popliteal bypass performed on 03/28/2019. 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 Subsequently she does have a 28-month follow-up with vascular in April but seems to be doing well from a vascular standpoint at this time. Overall the main issue for which she comes in today she does have a area on the plantar aspect of her right foot where she does have a callus there may or may not be a wound under this region. Subsequently she does have several small ulcerations on the bilateral lower extremities that are more venous stasis in nature. Continue to these do need to be addressed today as well. Fortunately there is no signs of active infection at this time. 05/25/2019 upon evaluation today patient appears to be doing well in regard to her lower extremities. Fortunately there is no signs of active infection at this time. No fever chills noted. She has been tolerating the dressing changes without complication. Fortunately she is doing much better in regard to her plantar foot in fact this appears to be completely healed which is excellent news. Electronic Signature(s) Signed: 05/25/2019 3:06:51 PM By: Worthy Keeler PA-C Entered By: Worthy Keeler on 05/25/2019 15:06:50 -------------------------------------------------------------------------------- Physical Exam Details Patient Name: Date of Service: ARDEAN, SIMONICH 05/25/2019 1:15 PM Medical Record [id number] Patient Account Number: [id number] Date of Birth/Sex: Treating RN: [birth year] (74 y.o. F) Baruch Gouty Primary  Care Provider: [name] Other Clinician: Referring Provider: Treating Provider/Extender:Stone III, Lemar Livings, Lendon Ka Weeks in Treatment: 1 Constitutional Well-nourished and well-hydrated in no acute distress. Respiratory normal breathing without difficulty. Psychiatric this patient is able to make decisions and demonstrates good insight into disease process. Alert and Oriented x  3. pleasant and cooperative. Notes On inspection today patient's wounds appear to be showing signs of good granulation at this point. Fortunately there is no evidence of active infection at this time which is great news. I do feel like she is progressing in the proper direction and I am hopeful that she will continue to do so over the next week. Electronic Signature(s) Signed: 05/25/2019 3:07:11 PM By: Worthy Keeler PA-C Entered By: Worthy Keeler on 05/25/2019 15:07:10 -------------------------------------------------------------------------------- Physician Orders Details Patient Name: Date of Service: ERNEST, ORR 05/25/2019 1:15 PM Medical Record [id number] Patient Account Number: [id number] Date of Birth/Sex: Treating RN: 08/21/1945 (74 y.o. [name] Primary Care Provider: [name] Other Clinician: Referring Provider: Treating Provider/Extender:Stone III, Lemar Livings, Lendon Ka Weeks in Treatment: 1 Verbal / Phone Orders: No Diagnosis Coding ICD-10 Coding Code Description I87.2 Venous insufficiency (chronic) (peripheral) I89.0 Lymphedema, not elsewhere classified L97.812 Non-pressure chronic ulcer of other part of right lower leg with fat layer exposed L97.822 Non-pressure chronic ulcer of other part of left lower leg with fat layer exposed E11.621 Type 2 diabetes mellitus with foot ulcer L97.512 Non-pressure chronic ulcer of 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d I73.89 Other specified peripheral vascular diseases N18.6 End stage renal disease Z99.2 Dependence on renal dialysis I10 Essential (primary) hypertension Follow-up Appointments Return Appointment in 1 week. Dressing Change Frequency Do not change entire dressing for one week. Skin Barriers/Peri-Wound Care Moisturizing lotion - both legs Wound Cleansing May shower with protection. Primary Wound Dressing Wound #10 Left,Medial Lower Leg Calcium Alginate with  Silver Wound #8 Right,Anterior Lower Leg Calcium Alginate with Silver Wound #9 Right,Medial,Posterior Lower Leg Calcium Alginate with Silver Secondary Dressing Wound #10 Left,Medial Lower Leg Dry Gauze Wound #8 Right,Anterior Lower Leg Dry Gauze Wound #9 Right,Medial,Posterior Lower Leg Dry Gauze Edema Control Kerlix and Coban - Bilateral Avoid standing for long periods of time Elevate legs to the level of the heart or above for 30 minutes daily and/or when sitting, a frequency of: Off-Loading Other: - foam donut right plantar callous [facility] - [facility] Electronic Signature(s) Signed: 05/26/2019 12:37:21 PM By: Worthy Keeler PA-C Signed: 05/27/2019 6:14:01 PM By: Baruch Gouty RN, BSN Entered By: Baruch Gouty on 05/25/2019 15:05:45 -------------------------------------------------------------------------------- Problem List Details Patient Name: Date of Service: [name] 05/25/2019 1:15 PM Medical Record [id number] Patient Account Number: [id number] Date of Birth/Sex: Treating RN: 1945/10/27 (74 y.o. [name], Vaughan Basta Primary Care Provider: [name] Other Clinician: Referring Provider: Treating Provider/Extender:Stone III, Lemar Livings, Lendon Ka Weeks in Treatment: 1 Active Problems ICD-10 Evaluated Encounter Code Description Active Date Today Diagnosis I87.2 Venous insufficiency (chronic) (peripheral) 05/18/2019 No Yes I89.0 Lymphedema, not elsewhere classified 05/18/2019 No Yes L97.812 Non-pressure chronic ulcer of other part of right lower 05/18/2019 No Yes leg with fat layer exposed L97.822 Non-pressure chronic ulcer of other part of left lower 05/18/2019 No Yes leg with fat layer exposed E11.621 Type 2 diabetes mellitus with foot ulcer 05/18/2019 No Yes L97.512 Non-pressure chronic ulcer of other part of right foot 05/18/2019 No Yes with fat layer exposed I73.89 Other  specified peripheral  vascular diseases 05/18/2019 No Yes N18.6 End stage renal disease 05/18/2019 No Yes Z99.2 Dependence on renal dialysis 05/18/2019 No Yes I10 Essential (primary) hypertension 05/18/2019 No Yes Inactive Problems Resolved Problems Electronic Signature(s) Signed: 05/25/2019 1:51:47 PM By: Worthy Keeler PA-C Entered By: Worthy Keeler on 05/25/2019 13:51:47 -------------------------------------------------------------------------------- Progress Note Details Patient Name: Date of Service: [name] 05/25/2019 1:15 PM Medical Record [id number] Patient Account Number: [id number] Date of Birth/Sex: Treating RN: 12-23-1945 (74 y.o. [name] Primary Care Provider: [name] Other Clinician: Referring Provider: Treating Provider/Extender:Stone III, Lemar Livings, Lendon Ka Weeks in Treatment: 1 Subjective Chief Complaint Information obtained from Patient Bilateral LE ulcers and Right plantar foot ulcer History of Present Illness (HPI) 02/23/2019 on evaluation today patient presents for initial evaluation in our clinic concerning issues that she has been having with her bilateral lower extremities as well as her plantar foot on the right. This has been quite some time in regard to the foot ulcer even back as far as the beginning of this year that is 2020. She has had the wounds on her legs a much shorter time but that still has been roughly 2-3 months. Nonetheless she is really not shown signs of a lot of improvement. She tells me that she does have a past medical history of diabetes, venous insufficiency, lymphedema which is mild but nonetheless present, peripheral vascular disease and she has previously been recommended to have a femoral-popliteal bypass but put that off at that point they are basically just monitoring at this time. She has end-stage renal disease with dependence on renal dialysis, hypertension, and obviously wounds of the  bilateral lower extremities at this point. She tells me that she is having some discomfort but fortunately nothing too significant at this point which is good news. No fevers, chills, nausea, vomiting, or diarrhea. She is mainly been leaving these areas open to air as much as possible at this point at 1 point she was trying to keep them covered more but that really did not help either. Fortunately there is no signs of active systemic nor local infection at this time. 03/02/2019 on evaluation today patient appears to be doing well with regard to her wounds. The leg ulcers which were very dry are much more moist at this point and seem to be doing great at this time. Overall I am very pleased with how things seem to be progressing. With regard to her foot ulcer it is showing some signs of slight improvement in my opinion overall there does not appear to be anything worse at least which is also good news. No fevers, chills, nausea, vomiting, or diarrhea. 03/09/2019 on evaluation today patient appears to be doing better with regard to her lower extremity ulcers a lot of the necrotic tissue is loosening up which is good news. In regard to her right plantar foot ulcer this appears to be roughly the same based on what I am seeing currently although there is less callus buildup. Unfortunately she does have an open wound area on the left lateral foot region which unfortunately probes down to bone once this was thoroughly examined. This is something she did not even know was opened she had had a Band-Aid over it just for protection last couple times I saw her. 03/16/19 evaluation today patient appears to be doing Someone poorly in regard to her left lower extremity. This seems to be likely infected based on what Ioom seeing at this point. Thereoos no signs of  active infection at this time systemically but nonetheless thereoos a lot more erythema of the foot in general unfortunately. Her x-rays were reviewed  and did not show any evidence of infection of the right foot. On the left there was a A little bit more going on but no obvious evidence of osteomyelitis though it was mentioned that if that was still a concern that number I would be the best way to further evaluate this. I think they were going to need to proceed with MRI to be honest based on what Ioom seeing today. 03/23/2019 on evaluation today patient actually appears to be doing worse compared to even last week's visit with regard to her wounds especially the left foot but even the right lower extremity. Both are measuring deeper as far as the overall depth of the wound on the left foot this also seems to have spread as far as the open wound location. It also does appear to me that the cellulitis has spread further up her leg which also has me very concerned at this point. There fortunately is no signs of active infection at this point systemically such as evidence of sepsis but nonetheless I am still concerned about the fact that again the patient does not seem to be turning around. We did have her on IV vancomycin through dialysis which they graciously help this with as well. With that being said it appears based on her culture results that I did review today that cefepime or something of the like would likely be a better option the vancomycin is not likely to help with the Serratia species that was identified. 03/28/19 Fem-Pop 04/06/19 Amp toe 80month follow-up with vascular in april 05/18/2019 on evaluation today patient presents for reevaluation here in the clinic after having been sent to the ER by myself December 2020 due to overall worsening of her condition. She did have a femoral-popliteal bypass performed on 03/28/2019. 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 Subsequently she does have a 23-month follow-up with vascular in April but seems to be doing well from a  vascular standpoint at this time. Overall the main issue for which she comes in today she does have a area on the plantar aspect of her right foot where she does have a callus there may or may not be a wound under this region. Subsequently she does have several small ulcerations on the bilateral lower extremities that are more venous stasis in nature. Continue to these do need to be addressed today as well. Fortunately there is no signs of active infection at this time. 05/25/2019 upon evaluation today patient appears to be doing well in regard to her lower extremities. Fortunately there is no signs of active infection at this time. No fever chills noted. She has been tolerating the dressing changes without complication. Fortunately she is doing much better in regard to her plantar foot in fact this appears to be completely healed which is excellent news. Objective Constitutional Well-nourished and well-hydrated in no acute distress. Vitals Time Taken: 2:00 PM, Height: 64 in, Weight: 160 lbs, BMI: 27.5, Temperature: 98.3 F, Pulse: 98 bpm, Respiratory Rate: 16 breaths/min, Blood Pressure: 144/79 mmHg. Respiratory normal breathing without difficulty. Psychiatric this patient is able to make decisions and demonstrates good insight into disease process. Alert and Oriented x 3. pleasant and cooperative. General Notes: On inspection today patient's wounds appear to be showing signs of good granulation at this point.  Fortunately there is no evidence of active infection at this time which is great news. I do feel like she is progressing in the proper direction and I am hopeful that she will continue to do so over the next week. Integumentary (Hair, Skin) Wound #10 status is Open. Original cause of wound was Gradually Appeared. The wound is located on the Left,Medial Lower Leg. The wound measures 1.4cm length x 0.3cm width x 0.1cm depth; 0.33cm^2 area and 0.033cm^3 volume. There is no tunneling or  undermining noted. There is a small amount of serosanguineous drainage noted. The wound margin is flat and intact. There is large (67-100%) pink granulation within the wound bed. There is no necrotic tissue within the wound bed. Wound #11 status is Open. Original cause of wound was Gradually Appeared. The wound is located on the [place]. The wound measures 0.2cm length x 0.2cm width x 0.1cm depth; 0.031cm^2 area and 0.003cm^3 volume. There is Fat Layer (Subcutaneous Tissue) Exposed exposed. There is no tunneling or undermining noted. There is a small amount of serous drainage noted. The wound margin is flat and intact. There is large (67-100%) red granulation within the wound bed. There is no necrotic tissue within the wound bed. Wound #8 status is Open. Original cause of wound was Gradually Appeared. The wound is located on the Right,Anterior Lower Leg. The wound measures 1.2cm length x 0.7cm width x 0.1cm depth; 0.66cm^2 area and 0.066cm^3 volume. There is Fat Layer (Subcutaneous Tissue) Exposed exposed. There is no tunneling or undermining noted. There is a medium amount of serosanguineous drainage noted. The wound margin is flat and intact. There is large (67-100%) red granulation within the wound bed. There is no necrotic tissue within the wound bed. Wound #9 status is Open. Original cause of wound was Gradually Appeared. The wound is located on the Right,Medial,Posterior Lower Leg. The wound measures 0.2cm length x 0.2cm width x 0.1cm depth; 0.031cm^2 area and 0.003cm^3 volume. There is Fat Layer (Subcutaneous Tissue) Exposed exposed. There is no tunneling or undermining noted. There is a small amount of serosanguineous drainage noted. The wound margin is flat and intact. There is large (67-100%) pink granulation within the wound bed. There is no necrotic tissue within the wound bed. Assessment Active Problems ICD-10 Venous insufficiency (chronic) (peripheral) Lymphedema, not  elsewhere classified Non-pressure chronic ulcer of other part of right lower leg with fat layer exposed Non-pressure chronic ulcer of other part of left lower leg with fat layer exposed Type 2 diabetes mellitus with foot ulcer Non-pressure chronic ulcer of 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d Other specified peripheral vascular diseases End stage renal disease Dependence on renal dialysis Essential (primary) hypertension Plan Follow-up Appointments: Return Appointment in 1 week. Dressing Change Frequency: Do not change entire dressing for one week. Skin Barriers/Peri-Wound Care: Moisturizing lotion - both legs Wound Cleansing: May shower with protection. Primary Wound Dressing: Wound #10 Left,Medial Lower Leg: Calcium Alginate with Silver Wound #8 Right,Anterior Lower Leg: Calcium Alginate with Silver Wound #9 Right,Medial,Posterior Lower Leg: Calcium Alginate with Silver Secondary Dressing: Wound #10 Left,Medial Lower Leg: Dry Gauze Wound #8 Right,Anterior Lower Leg: Dry Gauze Wound #9 Right,Medial,Posterior Lower Leg: Dry Gauze Edema Control: Kerlix and Coban - Bilateral Avoid standing for long periods of time Elevate legs to the level of the heart or above for 30 minutes daily and/or when sitting, a frequency of: Off-Loading: Other: - foam donut right plantar callous Home Health: [place] [facility] for wound care. -  [facility] 1. I would recommend currently that we continue with the current wound care measures which includes the silver alginate dressing to the open wounds and subsequently we will continue with the Curlex and Coban wraps bilaterally. 2. I am also going to suggest that we continue with elevation is much as possible when she is sitting she should be elevating her legs when she is up moving around I am okay with her being as active as she needs to be. 3. I am going to suggest she use a foam callus pad for the right  plantar foot in order to help prevent this from breaking down going forward. We will see patient back for reevaluation in 1 week here in the clinic. If anything worsens or changes patient will contact our office for additional recommendations. Electronic Signature(s) Signed: 05/25/2019 3:08:01 PM By: Worthy Keeler PA-C Entered By: Worthy Keeler on 05/25/2019 15:08:01 -------------------------------------------------------------------------------- SuperBill Details Patient Name: Date of Service: [name] 05/25/2019 Medical Record [id number] Patient Account Number: [id number] Date of Birth/Sex: Treating RN: [birth year] (74 y.o. [name] Primary Care Provider: [name] Other Clinician: Referring Provider: Treating Provider/Extender:Stone III, Lemar Livings, Lendon Ka Weeks in Treatment: 1 Diagnosis Coding ICD-10 Codes Code Description I87.2 Venous insufficiency (chronic) (peripheral) I89.0 Lymphedema, not elsewhere classified L97.812 Non-pressure chronic ulcer of other part of right lower leg with fat layer exposed L97.822 Non-pressure chronic ulcer of other part of left lower leg with fat layer exposed E11.621 Type 2 diabetes mellitus with foot ulcer L97.512 Non-pressure chronic ulcer of 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d I73.89 Other specified peripheral vascular diseases N18.6 End stage renal disease Z99.2 Dependence on renal dialysis I10 Essential (primary) hypertension Facility Procedures CPT4 Code: [id number] Description: [id number] - WOUND CARE VISIT-LEV 5 EST PT Modifier: Quantity: 1 Physician Procedures CPT4 Code Description: [id number] - WC PHYS LEVEL 3 - EST PT ICD-10 Diagnosis Description I87.2 Venous insufficiency (chronic) (peripheral) I89.0 Lymphedema, not elsewhere classified E07.121 Non-pressure chronic ulcer of other part of right lower  L97.822 Non-pressure chronic ulcer of other part of left lower Modifier: leg with  fat la leg with fat lay Quantity: 1 yer exposed er exposed Electronic Signature(s) Signed: 05/26/2019 12:37:21 PM By: Worthy Keeler PA-C Signed: 05/27/2019 6:14:01 PM By: Baruch Gouty RN, BSN Previous Signature: 05/25/2019 3:08:15 PM Version By: Worthy Keeler PA-C Entered By: Baruch Gouty on 05/25/2019 15:09:17

## 2019-05-25 NOTE — Progress Notes (Signed)
Office Visit Note   Patient: [name]           Date of Birth: 06/02/1945           MRN: [id number] Visit Date: 05/25/2019              Requested by: Unk Pinto, [address] [place],  [place] [address] PCP: Unk Pinto, MD   Assessment & Plan: Visit Diagnoses:  1. Pain of left upper arm   2. Pain in left wrist   3. Anterior dislocation of left shoulder, initial encounter     Plan: I showed the patient her x-rays and a shoulder model and explained that she does have an anterior dislocation of her left shoulder.  At this point since it has been a week out, this will require an operative intervention.  Under anesthesia we will attempt a closed reduction.  Obviously if this is difficult we will need to then open the shoulder to reduce it.  Hopefully this can be done closed.  We will set her up for surgery for this Friday.  I explained in detail why we are recommending this as well as what the risk and benefits are.  We would then see her back in a week with an AP and scapular Y view of the left shoulder.  All questions and concerns were answered and addressed.  Follow-Up Instructions: Return in about 1 week (around 06/01/2019).   Orders:  Orders Placed This Encounter  Procedures  . XR Humerus Left  . XR Wrist Complete Left   No orders of the defined types were placed in this encounter.     Procedures: No procedures performed   Clinical Data: No additional findings.   Subjective: Chief Complaint  Patient presents with  . Left Upper Arm - Pain, Injury  . Left Wrist - Pain, Injury  The patient is a very pleasant 74 year old female who comes in today after falling a week ago out of her chair onto her left arm.  She did not go to urgent care or the emergency room but then develops bruising around her shoulder all the way down into her arm with swelling in her left hand.  She says has become difficult to dress and she has not been able to move  her left shoulder well.  She is never injured this before.  She has multiple medical issues including being on dialysis.  Her dialysis schedule is Tuesday, Thursday, Saturday.  She denies any numbness and tingling in her hand.  HPI  Review of Systems She currently denies any headache, chest pain, shortness of breath, fever, chills, nausea, vomiting  Objective: Vital Signs: There were no vitals taken for this visit.  Physical Exam She is alert and oriented x3 and in no acute distress Ortho Exam Examination of her left upper extremity shows a positive sulcus sign at the left shoulder.  She has limited range of motion of the left shoulder with pain when I attempt to put the shoulder through some motion.  Her elbow hand and wrist exam are normal other than significant swelling in her hand.  She can actively flex and extend her fingers and wrist.  She has palpable pulses in her wrist and normal sensation in her hand. Specialty Comments:  No specialty comments available.  Imaging: XR Wrist Complete Left  Result Date: 05/25/2019 3 views of the left wrist show no acute injury and no evidence of fracture.  XR Humerus Left  Result Date: 05/25/2019 3 views of the left humerus show an anterior dislocation of the left shoulder.  The humeral head is definitely anterior to the glenoid.  No fracture lines are visible.    PMFS History: Patient Active Problem List   Diagnosis Date Noted  . Neuropathic pain   . Labile blood glucose   . Benign essential HTN   . Debility 04/07/2019  . 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 S/P femoral-popliteal bypass surgery 04/07/2019  . Chronic bilateral low back pain without sciatica   . Abscess of right foot   . Hyperlipidemia associated with type 2 diabetes mellitus ([facility]) 01/09/2019  . Diabetes mellitus due to underlying condition with chronic kidney disease on chronic dialysis, with long-term current use of insulin  ([place]) 01/09/2019  . ESRD on hemodialysis ([facility])   . Generalized weakness   . Mitral valve mass   . Coronary artery disease involving coronary bypass graft of native heart without angina pectoris   . Bradycardia   . Anemia 05/03/2017  . Diabetes mellitus type 2, insulin dependent ([facility]) 05/08/2015  . BMI 29.0-29.9,adult 02/14/2015  . Medication management 08/04/2014  . Diabetic neuropathy ([facility]) 02/17/2014  . Gouty arthritis   . Vitamin D deficiency   . Malignant neoplasm of upper-inner quadrant of left breast in female, estrogen receptor positive ([place]) 02/14/2013  . PVD (peripheral vascular disease) ([facility]) 09/05/2009  . Essential hypertension 06/20/2009  . Congestive heart failure ([place]) 06/20/2009  . Asthma 06/20/2009   Past Medical History:  Diagnosis Date  . Allergy   . Arthritis    "maybe in my lower back" (08/27/2012)  . 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 Chronic combined systolic and diastolic CHF (congestive heart failure) ([facility])   . CKD (chronic kidney disease), stage IV ([facility] [place])   . Coronary artery disease    a. NSTEMI in 05/2009 s/p CABG (LIMA-LAD, SVG-diagonal, SVG-OM1/OM 2).   Marland Kitchen Dyspnea    with exertion  . Family history of anesthesia complication    Mom has a hard time to wake up  . Foot ulcer ([facility])    "I've had them on both feet" (08/27/2012)  . Gouty arthritis    "right index finger" (08/27/2012)  . History of radiation therapy 06/09/13-07/06/13   left breast 50Gy  . Hyperlipidemia   . Hypertension   . Infection    right second toe  . Ischemic cardiomyopathy    a. 2011: EF 40-45%. b. EF 55-60% in 04/2017 but shortly after as inpatient was 45-50%.  Marland Kitchen LBBB (left bundle branch block)   . Mitral regurgitation   . Neuropathy    Hx; of B/L feet  . NSTEMI (non-ST elevated myocardial infarction) ([facility]) 05/23/2009  . Osteomyelitis of foot ([place])   . PAD (peripheral artery disease) ([facility])    a. LE PAD (patient previously  elected hold off L fem-pop), prev followed by Dr. Bridgett Larsson  . PONV (postoperative nausea and vomiting)   . Sinus headache    "occasionally" (08/27/2012)  . Type II diabetes mellitus ([facility])   . Vitamin D deficiency     Family History  Problem Relation Age of Onset  . Kidney cancer Brother 58  . Hypertension Brother   . Breast cancer Sister 73       LCIS; BRCA negative  . Throat cancer Father 28       smoker  . Breast cancer Sister 36  Lobular breast cancer  . Breast cancer Maternal Aunt        dx in her 76s    Past Surgical History:  Procedure Laterality Date  . ABDOMINAL AORTOGRAM W/LOWER EXTREMITY Bilateral 03/25/2019   Procedure: ABDOMINAL AORTOGRAM W/LOWER EXTREMITY;  Surgeon: Marty Heck, MD;  Location: [facility];  Service: Cardiovascular;  Laterality: Bilateral;  . AMPUTATION Right 08/27/2012   Procedure: AMPUTATION RAY;  Surgeon: Newt Minion, MD;  Location: [facility];  Service: Orthopedics;  Laterality: Right;  Right Foot 5th Ray Amputation  . AMPUTATION Left 07/08/2013   Procedure: AMPUTATION RAY;  Surgeon: Newt Minion, MD;  Location: [facility];  Service: Orthopedics;  Laterality: Left;  Left Foot 2nd Ray Amputation  . AMPUTATION Left 04/06/2019   Procedure: LEFT 5TH RAY AMPUTATION;  Surgeon: Newt Minion, MD;  Location: [facility];  Service: Orthopedics;  Laterality: Left;  . AMPUTATION RAY Right 08/27/2012   5th ray/notes 08/27/2012  . AMPUTATION TOE Right 03/06/2017   Procedure: AMPUTATION TOE, INTERPHANGEAL 2ND RIGHT;  Surgeon: [name], DPM;  Location: [facility];  Service: Podiatry;  Laterality: Right;  . AV FISTULA PLACEMENT Right 11/11/2017   Procedure: RIGHT RADIOCEPHALIC  ARTERIOVENOUS FISTULA;  Surgeon: [name], MD;  Location: [facility];  Service: Vascular;  Laterality: Right;  . BREAST LUMPECTOMY  04/20/2013   with biopsy      DR WAKEFIELD  . BREAST LUMPECTOMY WITH NEEDLE LOCALIZATION AND AXILLARY SENTINEL LYMPH NODE BX Left 04/20/2013   Procedure:  LEFT BREAST WIRE GUIDED LUMPECTOMY AND AXILLARY SENTINEL LYMPH NODE BX;  Surgeon: Rolm Bookbinder, MD;  Location: [facility];  Service: General;  Laterality: Left;  . CARDIAC CATHETERIZATION  05/24/2009   Archie Endo 05/24/2009 (08/27/2012)  . CATARACT EXTRACTION W/ INTRAOCULAR LENS  IMPLANT, BILATERAL  2000's  . COLONOSCOPY W/ BIOPSIES AND POLYPECTOMY  2010  . CORONARY ARTERY BYPASS GRAFT  2011   "CABG X4" (08/27/2012)  . DENTAL SURGERY  04/30/11   "1 implant" (08/27/2012)  . DILATION AND CURETTAGE OF UTERUS    . ENDARTERECTOMY FEMORAL Left 03/28/2019   Procedure: ENDARTERECTOMY FEMORAL;  Surgeon: [name], MD;  Location: [facility];  Service: Vascular;  Laterality: Left;  . EYE SURGERY    . FEMORAL-POPLITEAL BYPASS GRAFT Left 03/28/2019   Procedure: BYPASS GRAFT FEMORAL-POPLITEAL ARTERY;  Surgeon: [name], MD;  Location: [facility];  Service: Vascular;  Laterality: Left;  . FINGER SURGERY Right    "index finger; turned out to be gout" (08/27/2012)  . IR FLUORO GUIDE CV LINE RIGHT  11/30/2017  . PATCH ANGIOPLASTY Left 03/28/2019   Procedure: Patch Angioplasty;  Surgeon: [name], MD;  Location: [facility];  Service: Vascular;  Laterality: Left;  . TEE WITHOUT CARDIOVERSION N/A 12/01/2017   Procedure: TRANSESOPHAGEAL ECHOCARDIOGRAM (TEE);  Surgeon: Lelon Perla, MD;  Location: [facility];  Service: Cardiovascular;  Laterality: N/A;   Social History   Occupational History  . Not on file  Tobacco Use  . Smoking status: Never Smoker  . Smokeless tobacco: Never Used  Substance and Sexual Activity  . Alcohol use: Not Currently    Comment: rarely drinks wine  . Drug use: No  . Sexual activity: Not Currently

## 2019-05-26 ENCOUNTER — Encounter (HOSPITAL_COMMUNITY): Payer: Self-pay | Admitting: Orthopaedic Surgery

## 2019-05-26 ENCOUNTER — Ambulatory Visit: Payer: Medicare Other | Admitting: Adult Health

## 2019-05-26 DIAGNOSIS — N186 End stage renal disease: Secondary | ICD-10-CM | POA: Diagnosis not present

## 2019-05-26 DIAGNOSIS — D631 Anemia in chronic kidney disease: Secondary | ICD-10-CM | POA: Diagnosis not present

## 2019-05-26 DIAGNOSIS — E119 Type 2 diabetes mellitus without complications: Secondary | ICD-10-CM | POA: Diagnosis not present

## 2019-05-26 DIAGNOSIS — N2581 Secondary hyperparathyroidism of renal origin: Secondary | ICD-10-CM | POA: Diagnosis not present

## 2019-05-26 DIAGNOSIS — D509 Iron deficiency anemia, unspecified: Secondary | ICD-10-CM | POA: Diagnosis not present

## 2019-05-26 DIAGNOSIS — Z992 Dependence on renal dialysis: Secondary | ICD-10-CM | POA: Diagnosis not present

## 2019-05-26 NOTE — Progress Notes (Signed)
Please place orders in Epic as patient has surgery on 05/27/2019! Thank you!

## 2019-05-26 NOTE — Progress Notes (Signed)
[name], [name] ([id number]) Visit Report for 05/18/2019 Chief Complaint Document Details Patient Name: Date of Service: [name], [name] 05/18/2019 2:45 PM Medical Record [id number] Patient Account Number: [id number] Date of Birth/Sex: Treating RN: 10/16/1945 (74 y.o. [name] Primary Care Provider: [name] Other Clinician: Referring Provider: Treating Provider/Extender:Stone III, Lemar Livings, Lendon Ka Weeks in Treatment: 0 Information Obtained from: Patient Chief Complaint Bilateral LE ulcers and Right plantar foot ulcer Electronic Signature(s) Signed: 05/18/2019 6:05:25 PM By: Worthy Keeler PA-C Entered By: Worthy Keeler on 05/18/2019 18:05:25 -------------------------------------------------------------------------------- Debridement Details Patient Name: Date of Service: [name] 05/18/2019 2:45 PM Medical Record [id number] Patient Account Number: [id number] Date of Birth/Sex: Treating RN: [birth year] (73 y.o. [name] Primary Care Provider: [name] Other Clinician: Referring Provider: Treating Provider/Extender:Stone III, Lemar Livings, Lendon Ka Weeks in Treatment: 0 Debridement Performed for Wound #11 Right,Plantar Foot Assessment: Performed By: Physician Worthy Keeler, PA Debridement Type: Debridement Severity of Tissue Pre Fat layer exposed Debridement: Level of Consciousness (Pre- Awake and Alert procedure): Pre-procedure Verification/Time Out Taken: Yes - 16:55 Start Time: 16:55 Total Area Debrided (L x W): 0.7 (cm) x 0.5 (cm) = 0.35 (cm) Tissue and other material Viable, Non-Viable, Callus, Skin: Epidermis debrided: Level: Skin/Epidermis Debridement Description: Selective/Open Wound Instrument: Curette Bleeding: None End Time: 16:58 Procedural Pain: 0 Post Procedural Pain: 0 Response to Treatment: Procedure was tolerated well Level of Consciousness Awake and  Alert (Post-procedure): Post Debridement Measurements of Total Wound Length: (cm) 0.7 Width: (cm) 0.1 Depth: (cm) 0.1 Volume: (cm) 0.005 Character of Wound/Ulcer Post Improved Debridement: Severity of Tissue Post Debridement: Fat layer exposed Post Procedure Diagnosis Same as Pre-procedure Electronic Signature(s) Signed: 05/18/2019 5:39:41 PM By: Baruch Gouty RN, BSN Signed: 05/18/2019 6:32:46 PM By: Worthy Keeler PA-C Entered By: Baruch Gouty on 05/18/2019 17:00:03 -------------------------------------------------------------------------------- HPI Details Patient Name: Date of Service: [name] 05/18/2019 2:45 PM Medical Record [id number] Patient Account Number: [id number] Date of Birth/Sex: Treating RN: 1945/10/30 (74 y.o. [name] Primary Care Provider: [name] Other Clinician: Referring Provider: Treating Provider/Extender:Stone III, Lemar Livings, Lendon Ka Weeks in Treatment: 0 History of Present Illness HPI Description: 02/23/2019 on evaluation today patient presents for initial evaluation in our clinic concerning issues that she has been having with her bilateral lower extremities as well as her plantar foot on the right. This has been quite some time in regard to the foot ulcer even back as far as the beginning of this year that is 2020. She has had the wounds on her legs a much shorter time but that still has been roughly 2-3 months. Nonetheless she is really not shown signs of a lot of improvement. She tells me that she does have a past medical history of diabetes, venous insufficiency, lymphedema which is mild but nonetheless present, peripheral vascular disease and she has previously been recommended to have a femoral-popliteal bypass but put that off at that point they are basically just monitoring at this time. She has end-stage renal disease with dependence on renal dialysis, hypertension, and obviously wounds of the  bilateral lower extremities at this point. She tells me that she is having some discomfort but fortunately nothing too significant at this point which is good news. No fevers, chills, nausea, vomiting, or diarrhea. She is mainly been leaving these areas open to air as much as possible at this point at 1 point she was trying to keep them covered more but that really did not  help either. Fortunately there is no signs of active systemic nor local infection at this time. 03/02/2019 on evaluation today patient appears to be doing well with regard to her wounds. The leg ulcers which were very dry are much more moist at this point and seem to be doing great at this time. Overall I am very pleased with how things seem to be progressing. With regard to her foot ulcer it is showing some signs of slight improvement in my opinion overall there does not appear to be anything worse at least which is also good news. No fevers, chills, nausea, vomiting, or diarrhea. 03/09/2019 on evaluation today patient appears to be doing better with regard to her lower extremity ulcers a lot of the necrotic tissue is loosening up which is good news. In regard to her right plantar foot ulcer this appears to be roughly the same based on what I am seeing currently although there is less callus buildup. Unfortunately she does have an open wound area on the left lateral foot region which unfortunately probes down to bone once this was thoroughly examined. This is something she did not even know was opened she had had a Band-Aid over it just for protection last couple times I saw her. 03/16/19 evaluation today patient appears to be doing Someone poorly in regard to her left lower extremity. This seems to be likely infected based on what Im seeing at this point. Theres no signs of active infection at this time systemically but nonetheless theres a lot more erythema of the foot in general unfortunately. Her x-rays were reviewed and did  not show any evidence of infection of the right foot. On the left there was a A little bit more going on but no obvious evidence of osteomyelitis though it was mentioned that if that was still a concern that number I would be the best way to further evaluate this. I think they were going to need to proceed with MRI to be honest based on what Im seeing today. 03/23/2019 on evaluation today patient actually appears to be doing worse compared to even last week's visit with regard to her wounds especially the left foot but even the right lower extremity. Both are measuring deeper as far as the overall depth of the wound on the left foot this also seems to have spread as far as the open wound location. It also does appear to me that the cellulitis has spread further up her leg which also has me very concerned at this point. There fortunately is no signs of active infection at this point systemically such as evidence of sepsis but nonetheless I am still concerned about the fact that again the patient does not seem to be turning around. We did have her on IV vancomycin through dialysis which they graciously help this with as well. With that being said it appears based on her culture results that I did review today that cefepime or something of the like would likely be a better option the vancomycin is not likely to help with the Serratia species that was identified. 03/28/19 Fem-Pop 04/06/19 Amp toe 66month follow-up with vascular in april 05/18/2019 on evaluation today patient presents for reevaluation here in the clinic after having been sent to the ER by myself December 2020 due to overall worsening of her condition. She did have a femoral-popliteal bypass performed on 03/28/2019. 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 Subsequently she  does have a 60-month follow-up with vascular in April but seems to be doing well from a vascular  standpoint at this time. Overall the main issue for which she comes in today she does have a area on the plantar aspect of her right foot where she does have a callus there may or may not be a wound under this region. Subsequently she does have several small ulcerations on the bilateral lower extremities that are more venous stasis in nature. Continue to these do need to be addressed today as well. Fortunately there is no signs of active infection at this time. Electronic Signature(s) Signed: 05/18/2019 6:05:41 PM By: Worthy Keeler PA-C Entered By: Worthy Keeler on 05/18/2019 18:05:41 -------------------------------------------------------------------------------- Physical Exam Details Patient Name: Date of Service: [name], [name] 05/18/2019 2:45 PM Medical Record [id number] Patient Account Number: [id number] Date of Birth/Sex: Treating RN: January 11, 1946 (74 y.o. [name] Primary Care Provider: [name] Other Clinician: Referring Provider: Treating Provider/Extender:Stone III, Lemar Livings, Lendon Ka Weeks in Treatment: 0 Constitutional Well-nourished and well-hydrated in no acute distress. Respiratory normal breathing without difficulty. Psychiatric this patient is able to make decisions and demonstrates good insight into disease process. Alert and Oriented x 3. pleasant and cooperative. Notes Upon inspection today patient's wound bed actually showed signs of good granulation at most of the locations on her lower extremities there really was no need for sharp debridement at any of the sites today which is great news. With that being said she does unfortunately have some callus on the plantar aspect of her right foot which did require sharp debridement today. I was actually able to perform initially what I thought to be callus paring but as I got the callus removed she did have a small ulceration noted at this point. This is very superficial and I think  will likely heal quite well but I am glad we did find this so we can get it treated under better control Electronic Signature(s) Signed: 05/18/2019 6:06:16 PM By: Worthy Keeler PA-C Entered By: Worthy Keeler on 05/18/2019 18:06:16 -------------------------------------------------------------------------------- Physician Orders Details Patient Name: Date of Service: [name] 05/18/2019 2:45 PM Medical Record [id number] Patient Account Number: [id number] Date of Birth/Sex: Treating RN: 01-Aug-1945 (73 y.o. [name] Primary Care Provider: [name] Other Clinician: Referring Provider: Treating Provider/Extender:Stone III, Lemar Livings, Lendon Ka Weeks in Treatment: 0 Verbal / Phone Orders: No Diagnosis Coding Follow-up Appointments Return Appointment in 1 week. Dressing Change Frequency Do not change entire dressing for one week. Skin Barriers/Peri-Wound Care Moisturizing lotion - both legs Wound Cleansing May shower with protection. Primary Wound Dressing Wound #10 Left,Medial Lower Leg Calcium Alginate with Silver Wound #11 Right,Plantar Foot Calcium Alginate with Silver Wound #8 Right,Anterior Lower Leg Calcium Alginate with Silver Wound #9 Right,Medial,Posterior Lower Leg Calcium Alginate with Silver Secondary Dressing Wound #10 Left,Medial Lower Leg Dry Gauze Wound #8 Right,Anterior Lower Leg Dry Gauze Wound #9 Right,Medial,Posterior Lower Leg Dry Gauze Wound #11 Right,Plantar Foot Foam - foam donut Dry Gauze Edema Control Kerlix and Coban - Bilateral Avoid standing for long periods of time Elevate legs to the level of the heart or above for 30 minutes daily and/or when sitting, a frequency of: Electronic Signature(s) Signed: 05/18/2019 5:39:41 PM By: Baruch Gouty RN, BSN Signed: 05/18/2019 6:32:46 PM By: Worthy Keeler PA-C Entered By: Baruch Gouty on 05/18/2019  17:06:20 -------------------------------------------------------------------------------- Problem List Details Patient Name: Date of Service: [name]. 05/18/2019 2:45 PM Medical Record  [id number] Patient Account Number: [id number] Date of Birth/Sex: Treating RN: 1945-08-18 (74 y.o. 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 05/18/2019 No Yes leg with fat layer exposed L97.822 Non-pressure chronic ulcer of other part of left lower 05/18/2019 No Yes leg with fat layer exposed E11.621 Type 2 diabetes mellitus with foot ulcer 05/18/2019 No Yes L97.512 Non-pressure chronic ulcer of other part of right foot 05/18/2019 No Yes with fat layer exposed I73.89 Other specified peripheral vascular diseases 05/18/2019 No Yes N18.6 End stage renal disease 05/18/2019 No Yes Z99.2 Dependence on renal dialysis 05/18/2019 No Yes I10 Essential (primary) hypertension 05/18/2019 No Yes Inactive Problems Resolved Problems Electronic Signature(s) Signed: 05/18/2019 6:07:53 PM By: Worthy Keeler PA-C Previous Signature: 05/18/2019 6:05:06 PM Version By: Worthy Keeler PA-C Entered By: Worthy Keeler on 05/18/2019 18:07:53 -------------------------------------------------------------------------------- Progress Note Details Patient Name: Date of Service: [name] 05/18/2019 2:45 PM Medical Record [id number] Patient Account Number: [id number] Date of Birth/Sex: Treating RN: February 21, 1946 (74 y.o. [name] Primary Care Provider: [name] Other  Clinician: Referring Provider: Treating Provider/Extender:Stone III, Lemar Livings, Lendon Ka Weeks in Treatment: 0 Subjective Chief Complaint Information obtained from Patient Bilateral LE ulcers and Right plantar foot ulcer History of Present Illness (HPI) 02/23/2019 on evaluation today patient presents for initial evaluation in our clinic concerning issues that she has been having with her bilateral lower extremities as well as her plantar foot on the right. This has been quite some time in regard to the foot ulcer even back as far as the beginning of this year that is 2020. She has had the wounds on her legs a much shorter time but that still has been roughly 2-3 months. Nonetheless she is really not shown signs of a lot of improvement. She tells me that she does have a past medical history of diabetes, venous insufficiency, lymphedema which is mild but nonetheless present, peripheral vascular disease and she has previously been recommended to have a femoral-popliteal bypass but put that off at that point they are basically just monitoring at this time. She has end-stage renal disease with dependence on renal dialysis, hypertension, and obviously wounds of the bilateral lower extremities at this point. She tells me that she is having some discomfort but fortunately nothing too significant at this point which is good news. No fevers, chills, nausea, vomiting, or diarrhea. She is mainly been leaving these areas open to air as much as possible at this point at 1 point she was trying to keep them covered more but that really did not help either. Fortunately there is no signs of active systemic nor local infection at this time. 03/02/2019 on evaluation today patient appears to be doing well with regard to her wounds. The leg ulcers which were very dry are much more moist at this point and seem to be doing great at this time. Overall I am very pleased with how things seem to be progressing. With  regard to her foot ulcer it is showing some signs of slight improvement in my opinion overall there does not appear to be anything worse at least which is also good news. No fevers, chills, nausea, vomiting, or diarrhea.  03/09/2019 on evaluation today patient appears to be doing better with regard to her lower extremity ulcers a lot of the necrotic tissue is loosening up which is good news. In regard to her right plantar foot ulcer this appears to be roughly the same based on what I am seeing currently although there is less callus buildup. Unfortunately she does have an open wound area on the left lateral foot region which unfortunately probes down to bone once this was thoroughly examined. This is something she did not even know was opened she had had a Band-Aid over it just for protection last couple times I saw her. 03/16/19 evaluation today patient appears to be doing Someone poorly in regard to her left lower extremity. This seems to be likely infected based on what Ioom seeing at this point. Thereoos no signs of active infection at this time systemically but nonetheless thereoos a lot more erythema of the foot in general unfortunately. Her x-rays were reviewed and did not show any evidence of infection of the right foot. On the left there was a A little bit more going on but no obvious evidence of osteomyelitis though it was mentioned that if that was still a concern that number I would be the best way to further evaluate this. I think they were going to need to proceed with MRI to be honest based on what Ioom seeing today. 03/23/2019 on evaluation today patient actually appears to be doing worse compared to even last week's visit with regard to her wounds especially the left foot but even the right lower extremity. Both are measuring deeper as far as the overall depth of the wound on the left foot this also seems to have spread as far as the open wound location. It also does appear to me  that the cellulitis has spread further up her leg which also has me very concerned at this point. There fortunately is no signs of active infection at this point systemically such as evidence of sepsis but nonetheless I am still concerned about the fact that again the patient does not seem to be turning around. We did have her on IV vancomycin through dialysis which they graciously help this with as well. With that being said it appears based on her culture results that I did review today that cefepime or something of the like would likely be a better option the vancomycin is not likely to help with the Serratia species that was identified. 03/28/19 Fem-Pop 04/06/19 Amp toe 42month follow-up with vascular in april 05/18/2019 on evaluation today patient presents for reevaluation here in the clinic after having been sent to the ER by myself December 2020 due to overall worsening of her condition. She did have a femoral-popliteal bypass performed on 03/28/2019. 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 Subsequently she does have a 10-month follow-up with vascular in April but seems to be doing well from a vascular standpoint at this time. Overall the main issue for which she comes in today she does have a area on the plantar aspect of her right foot where she does have a callus there may or may not be a wound under this region. Subsequently she does have several small ulcerations on the bilateral lower extremities that are more venous stasis in nature. Continue to these do need to be addressed today as well. Fortunately there is no signs of active infection at this time. Patient History Information  obtained from Patient. Allergies atenolol (Severity: Moderate, Reaction: rash, exacerbates gout), Iodinated Contrast Media (Severity: Moderate, Reaction: Rash), iohexol (Severity: Moderate, Reaction: rash), latex (Severity: Moderate,  Reaction: rash) Family History Cancer - Father,Siblings, Diabetes - Maternal Grandparents,Paternal Grandparents,Mother,Father,Siblings, Heart Disease - Maternal Grandparents,Paternal Grandparents,Mother,Father,Siblings, Hypertension - Maternal Grandparents,Paternal Grandparents, Kidney Disease - Siblings, No family history of Hereditary Spherocytosis, Lung Disease, Seizures, Stroke, Thyroid Problems, Tuberculosis. Social History Never smoker, Marital Status - Married, Alcohol Use - Never, Drug Use - No History, Caffeine Use - Rarely. Medical History Eyes Patient has history of Cataracts - removed Hematologic/Lymphatic Patient has history of Anemia Respiratory Patient has history of Asthma Cardiovascular Patient has history of Congestive Heart Failure, Coronary Artery Disease, Hypertension, Peripheral Arterial Disease, Peripheral Venous Disease Endocrine Patient has history of Type II Diabetes Genitourinary Patient has history of End Stage Renal Disease - Hemodialysis 3x/week Integumentary (Skin) Denies history of History of Burn Musculoskeletal Patient has history of Osteoarthritis, Osteomyelitis - Left 5th toe Neurologic Patient has history of Neuropathy Oncologic Patient has history of Received Radiation Medical And Surgical History Notes Cardiovascular Hyperlipidemia, Cardiomyopathy, s/p fem-pop in 12/20 Musculoskeletal 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 Oncologic Breast cancer 2014 with lumpectomy Review of Systems (ROS) Constitutional Symptoms (General Health) Denies complaints or symptoms of Fatigue, Fever, Chills, Marked Weight Change. Ear/Nose/Mouth/Throat Denies complaints or symptoms of Chronic sinus problems or rhinitis. Gastrointestinal Denies complaints or symptoms of Frequent diarrhea, Nausea, Vomiting. Integumentary (Skin) Complains or has symptoms of Wounds - 2 wounds on right lower leg, 1 wound on left lower leg. Psychiatric Denies complaints or  symptoms of Claustrophobia, Suicidal. Objective Constitutional Well-nourished and well-hydrated in no acute distress. Vitals Time Taken: 3:30 PM, Height: 64 in, Source: Stated, Weight: 160 lbs, Source: Stated, BMI: 27.5, Temperature: 98.3 F, Pulse: 105 bpm, Respiratory Rate: 16 breaths/min, Blood Pressure: 133/53 mmHg. Respiratory normal breathing without difficulty. Psychiatric this patient is able to make decisions and demonstrates good insight into disease process. Alert and Oriented x 3. pleasant and cooperative. General Notes: Upon inspection today patient's wound bed actually showed signs of good granulation at most of the locations on her lower extremities there really was no need for sharp debridement at any of the sites today which is great news. With that being said she does unfortunately have some callus on the plantar aspect of her right foot which did require sharp debridement today. I was actually able to perform initially what I thought to be callus paring but as I got the callus removed she did have a small ulceration noted at this point. This is very superficial and I think will likely heal quite well but I am glad we did find this so we can get it treated under better control Integumentary (Hair, Skin) Wound #10 status is Open. Original cause of wound was Gradually Appeared. The wound is located on the Left,Medial Lower Leg. The wound measures 1.4cm length x 1cm width x 0.1cm depth; 1.1cm^2 area and 0.11cm^3 volume. There is no tunneling or undermining noted. There is a small amount of serosanguineous drainage noted. The wound margin is flat and intact. There is large (67-100%) pink granulation within the wound bed. There is no necrotic tissue within the wound bed. Wound #11 status is Open. Original cause of wound was Gradually Appeared. The wound is located on the [place]. The wound measures 0.7cm length x 0.1cm width x 0.1cm depth; 0.055cm^2 area  and 0.005cm^3 volume. There is Fat Layer (Subcutaneous Tissue) Exposed exposed. There is no tunneling  or undermining noted. There is a small amount of serous drainage noted. The wound margin is flat and intact. There is large (67-100%) red granulation within the wound bed. There is no necrotic tissue within the wound bed. Wound #8 status is Open. Original cause of wound was Gradually Appeared. The wound is located on the Right,Anterior Lower Leg. The wound measures 1.2cm length x 0.9cm width x 0.1cm depth; 0.848cm^2 area and 0.085cm^3 volume. There is Fat Layer (Subcutaneous Tissue) Exposed exposed. There is no tunneling or undermining noted. There is a medium amount of serosanguineous drainage noted. The wound margin is flat and intact. There is large (67-100%) red granulation within the wound bed. There is no necrotic tissue within the wound bed. Wound #9 status is Open. Original cause of wound was Gradually Appeared. The wound is located on the Right,Medial,Posterior Lower Leg. The wound measures 0.8cm length x 0.5cm width x 0.1cm depth; 0.314cm^2 area and 0.031cm^3 volume. There is Fat Layer (Subcutaneous Tissue) Exposed exposed. There is no tunneling or undermining noted. There is a small amount of serosanguineous drainage noted. The wound margin is flat and intact. There is large (67-100%) pink granulation within the wound bed. There is a small (1-33%) amount of necrotic tissue within the wound bed including Adherent Slough. Assessment Active Problems ICD-10 Venous insufficiency (chronic) (peripheral) Lymphedema, not elsewhere classified Non-pressure chronic ulcer of other part of right lower leg with fat layer exposed Non-pressure chronic ulcer of other part of left lower leg with fat layer exposed Type 2 diabetes mellitus with foot ulcer Non-pressure chronic ulcer of 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d Other specified peripheral vascular diseases End stage renal  disease Dependence on renal dialysis Essential (primary) hypertension Procedures Wound #11 Pre-procedure diagnosis of Wound #11 is a Diabetic Wound/Ulcer of the Lower Extremity located on the [place] .Severity of Tissue Pre Debridement is: Fat layer exposed. There was a Selective/Open Wound Skin/Epidermis Debridement with a total area of 0.35 sq cm performed by Worthy Keeler, PA. With the following instrument(s): Curette to remove Viable and Non-Viable tissue/material. Material removed includes Callus and Skin: Epidermis and. No specimens were taken. A time out was conducted at 16:55, prior to the start of the procedure. There was no bleeding. The procedure was tolerated well with a pain level of 0 throughout and a pain level of 0 following the procedure. Post Debridement Measurements: 0.7cm length x 0.1cm width x 0.1cm depth; 0.005cm^3 volume. Character of Wound/Ulcer Post Debridement is improved. Severity of Tissue Post Debridement is: Fat layer exposed. Post procedure Diagnosis Wound #11: Same as Pre-Procedure Plan Follow-up Appointments: Return Appointment in 1 week. Dressing Change Frequency: Do not change entire dressing for one week. Skin Barriers/Peri-Wound Care: Moisturizing lotion - both legs Wound Cleansing: May shower with protection. Primary Wound Dressing: Wound #10 Left,Medial Lower Leg: Calcium Alginate with Silver Wound #11 Right,Plantar Foot: Calcium Alginate with Silver Wound #8 Right,Anterior Lower Leg: Calcium Alginate with Silver Wound #9 Right,Medial,Posterior Lower Leg: Calcium Alginate with Silver Secondary Dressing: Wound #10 Left,Medial Lower Leg: Dry Gauze Wound #8 Right,Anterior Lower Leg: Dry Gauze Wound #9 Right,Medial,Posterior Lower Leg: Dry Gauze Wound #11 Right,Plantar Foot: Foam - foam donut Dry Gauze Edema Control: Kerlix and Coban - Bilateral Avoid standing for long periods of time Elevate legs to the level of the heart  or above for 30 minutes daily and/or when sitting, a frequency of: 1. My suggestion at this time is good to be that we go ahead and  initiate treatment with silver alginate dressings to all wound locations. 2. I am also going to suggest the Curlex and Coban wrap to the bilateral lower extremities which I think will also be helpful for the patient. This will also prevent her from having to worry about changing the dressing on her plantar foot at this time. 3. I do recommend she try to elevate her legs as much as possible to try to help with edema control. 4. With regard to her walking program with physical therapy I think it is fine for her to ambulate through this program. With that being said I do want her just to be cautious and careful that she is not overdoing things and we will see how things are next week. We will see patient back for reevaluation in 1 week here in the clinic. If anything worsens or changes patient will contact our office for additional recommendations. Electronic Signature(s) Signed: 05/18/2019 6:08:06 PM By: Worthy Keeler PA-C Previous Signature: 05/18/2019 6:06:38 PM Version By: Worthy Keeler PA-C Entered By: Worthy Keeler on 05/18/2019 18:08:06 -------------------------------------------------------------------------------- [facility] Details Patient Name: Date of Service: [name] 05/18/2019 2:45 PM Medical Record [id number] Patient Account Number: [id number] Date of Birth/Sex: Treating RN: 1945/04/27 (73 y.o. [name] Primary Care Provider: [name] Other Clinician: Referring Provider: Treating Provider/Extender:Stone III, Lemar Livings, Lendon Ka Weeks in Treatment: 0 Information Obtained From Patient Constitutional Symptoms (General Health) Complaints and Symptoms: Negative for: Fatigue; Fever; Chills; Marked Weight Change Ear/Nose/Mouth/Throat Complaints and Symptoms: Negative for: Chronic sinus problems or  rhinitis Gastrointestinal Complaints and Symptoms: Negative for: Frequent diarrhea; Nausea; Vomiting Integumentary (Skin) Complaints and Symptoms: Positive for: Wounds - 2 wounds on right lower leg, 1 wound on left lower leg Medical History: Negative for: History of Burn Psychiatric Complaints and Symptoms: Negative for: Claustrophobia; Suicidal Eyes Medical History: Positive for: Cataracts - removed Hematologic/Lymphatic Medical History: Positive for: Anemia Respiratory Medical History: Positive for: Asthma Cardiovascular Medical History: Positive for: Congestive Heart Failure; Coronary Artery Disease; Hypertension; Peripheral Arterial Disease; Peripheral Venous Disease Past Medical History Notes: Hyperlipidemia, Cardiomyopathy, s/p fem-pop in 12/20 Endocrine Medical History: Positive for: Type II Diabetes Time with diabetes: since 2000 Treated with: Insulin Blood sugar tested every day: Yes Tested : 1-2x a day Genitourinary Medical History: Positive for: End Stage Renal Disease - Hemodialysis 3x/week Immunological Musculoskeletal Medical History: Positive for: Osteoarthritis; Osteomyelitis - Left 5th toe Past Medical History Notes: 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 Neurologic Medical History: Positive for: Neuropathy Oncologic Medical History: Positive for: Received Radiation Past Medical History Notes: Breast cancer 2014 with lumpectomy HBO Extended History Items Eyes: Cataracts Immunizations Pneumococcal Vaccine: Received Pneumococcal Vaccination: Yes Tetanus Vaccine: Last tetanus shot: 01/13/2012 Implantable Devices None Family and Social History Cancer: Yes - Father,Siblings; Diabetes: Yes - Maternal Grandparents,Paternal Grandparents,Mother,Father,Siblings; Heart Disease: Yes - Maternal Grandparents,Paternal Grandparents,Mother,Father,Siblings; Hereditary Spherocytosis: No; Hypertension: Yes - Maternal Grandparents,Paternal Grandparents;  Kidney Disease: Yes - Siblings; Lung Disease: No; Seizures: No; Stroke: No; Thyroid Problems: No; Tuberculosis: No; Never smoker; Marital Status - Married; Alcohol Use: Never; Drug Use: No History; Caffeine Use: Rarely; Financial Concerns: No; Food, Clothing or Shelter Needs: No; Support System Lacking: No; Transportation Concerns: No Electronic Signature(s) Signed: 05/18/2019 6:32:46 PM By: Worthy Keeler PA-C Signed: 05/23/2019 6:14:31 PM By: [name] RN, BSN Entered By: [name] on 05/18/2019 16:07:25 -------------------------------------------------------------------------------- [place] Details Patient Name: Date of Service: [name] 05/18/2019 Medical Record [id number] Patient Account Number: [id number] Date of Birth/Sex: Treating  RN: 1946-02-28 (74 y.o. [name] Primary Care Provider: [name] Other Clinician: Referring Provider: Treating Provider/Extender:Stone III, Lemar Livings, Lendon Ka Weeks in Treatment: 0 Diagnosis Coding ICD-10 Codes Code Description I87.2 Venous insufficiency (chronic) (peripheral) I89.0 Lymphedema, not elsewhere classified L97.812 Non-pressure chronic ulcer of other part of right lower leg with fat layer exposed L97.822 Non-pressure chronic ulcer of other part of left lower leg with fat layer exposed E11.621 Type 2 diabetes mellitus with foot ulcer L97.512 Non-pressure chronic ulcer of 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d I73.89 Other specified peripheral vascular diseases N18.6 End stage renal disease Z99.2 Dependence on renal dialysis I10 Essential (primary) hypertension Facility Procedures CPT4 Code Description: 48250037 [id number] - WOUND CARE VISIT-LEV 3 EST PT Modifier: 25 Quantity: 1 CPT4 Code Description: [id number] - DEBRIDE WOUND 1ST 20 SQ CM OR < ICD-10 Diagnosis Description L97.512 Non-pressure chronic ulcer of other part of right foot with f Modifier: at layer expos Quantity:  1 ed Physician Procedures CPT4 Code Description: [id number] - WC PHYS LEVEL 4 - EST PT ICD-10 Diagnosis Description I87.2 Venous insufficiency (chronic) (peripheral) I89.0 Lymphedema, not elsewhere classified L97.812 Non-pressure chronic ulcer of other part of right lower le  L97.822 Non-pressure chronic ulcer of other part of left lower leg Modifier: 25 g with fat lay with fat laye Quantity: 1 er exposed r exposed CPT4 Code Description: [id number] - WC PHYS DEBR WO ANESTH 20 SQ CM ICD-10 Diagnosis Description L97.512 Non-pressure chronic ulcer of other part of right foot with f Modifier: at layer expos Quantity: 1 ed Electronic Signature(s) Signed: 05/25/2019 9:52:21 AM By: Darlin Priestly Signed: 05/26/2019 12:37:44 PM By: Worthy Keeler PA-C Previous Signature: 05/20/2019 5:30:09 PM Version By: Baruch Gouty RN, BSN Previous Signature: 05/18/2019 6:08:17 PM Version By: Worthy Keeler PA-C Previous Signature: 05/18/2019 6:07:07 PM Version By: Worthy Keeler PA-C Entered By: Darlin Priestly on 05/25/2019 09:52:21

## 2019-05-26 NOTE — Progress Notes (Deleted)
Patient ID: MICHILLE MCELRATH, female   DOB: June 30, 1945, 74 y.o.   MRN: [id number]  MEDICARE ANNUAL WELLNESS VISIT AND FOLLOW UP  Assessment:   Diagnoses and all orders for this visit:  Encounter for Medicare annual wellness exam  Peripheral vascular disease ([place]) Control blood pressure, cholesterol, glucose, increase exercise.   Chronic kidney disease (CKD), active medical management without dialysis, stage 5 ([place]) On dialysis, followed by nephrology  Coronary artery disease involving coronary bypass graft of native heart without angina pectoris Control blood pressure, cholesterol, glucose, increase exercise.  Followed by Dr. Johnsie Cancel  Chronic combined systolic and diastolic congestive heart failure ([place]) Managed by dialysis Encouraged daily monitoring of the patient's weight, call office if 5 lb weight loss or gain in a day.  Encouraged regular exercise. If any increasing shortness of breath, swelling, or chest pressure go to ER immediately.  decrease your fluid intake to less than 2 L daily please remember to always increase your potassium intake with any increase of your fluid pill.   ESRD (end stage renal disease) ([place]) Continue dialysis Followed by Dr. Hollie Salk & colleagues  Type II diabetes mellitus with neurological manifestations, uncontrolled ([place]) Discussed general issues about diabetes pathophysiology and management., Educational material distributed., Suggested low cholesterol diet., Encouraged aerobic exercise., Discussed foot care., Reminded to get yearly retinal exam. -     Hemoglobin A1c - REMINDED TO GET EYE EXAM  Diabetes mellitus type 2, insulin dependent ([place]) Discussed general issues about diabetes pathophysiology and management., Educational material distributed., Suggested low cholesterol diet., Encouraged aerobic exercise., Discussed foot care., Reminded to get yearly retinal exam. -     Hemoglobin A1c - REMINDED TO GET EYE EXAM  EMPHYSEMA Well controlled off  of inhalers, continue to monitor  Asthma with status asthmaticus, unspecified asthma severity, unspecified whether persistent Well controlled off of inhalers, continue to monitor  Malignant neoplasm of upper-inner quadrant of left breast in female, estrogen receptor positive ([place]) Off of anastrozole after 5 years, followed by Dr. Jana Hakim  Essential hypertension Managed via dialysis Monitor blood pressure at home; call if consistently over 130/80 Continue DASH diet.   Reminder to go to the ER if any CP, SOB, nausea, dizziness, severe HA, changes vision/speech, left arm numbness and tingling and jaw pain.  Mitral valve mass Follow up with Dr. Johnsie Cancel as recommended  Gouty arthritis Continue allopurinol Diet discussed Check uric acid as needed  Vitamin D deficiency At goal at recent check; continue to recommend supplementation for goal of 70-100 Defer vitamin D level  Medication management CBC, CMP/GFR, magnesium  Hyperlipidemia Continue medications Continue low cholesterol diet and exercise.  Check lipid panel.   Bradycardia Monitor  BMI 29.0-29.9,adult  Anemia, of chronic disease -CBC    Over 30 minutes of exam, counseling, chart review, and critical decision making was performed   Future Appointments  Date Time Provider [place]  05/27/2019 10:15 AM Liane Comber, NP [facility] None  06/01/2019  1:00 PM Mcarthur Rossetti, MD [facility] None  06/01/2019  3:00 PM Jeri Cos Eday III, PA-C [facility] [place]  08/01/2019 11:00 AM [facility] V[facility] 3 - EM [facility]  08/01/2019 11:45 AM [facility] PA [facility]  08/01/2019 12:00 PM [facility] VASC 3 - EM [facility]  08/03/2019  2:30 PM Unk Pinto, MD [facility] None  02/02/2020  3:00 PM Unk Pinto, MD [facility] None  05/08/2020 11:15 AM Liane Comber, NP [facility] None     Plan:   During the course of the visit the  patient was educated and counseled about appropriate screening and preventive  services including:    Pneumococcal vaccine   Influenza vaccine  Td vaccine  Prevnar 13  Screening electrocardiogram  Screening mammography  Bone densitometry screening  Colorectal cancer screening  Diabetes screening  Glaucoma screening  Nutrition counseling   Advanced directives: given info/requested copies    Subjective:   KATY BRICKELL is a 74 y.o. female who presents for Medicare Annual Wellness Visit and 3 month follow up on hypertension, diabetes, hyperlipidemia, vitamin D def.   She has had breast cancer in 2014, follows with [name], she completed 5 years on anastrozole.  She has ESRD recently initiated on hemodialysis, Tues, Thurs, Sat, after exacerbatin of CHF in 11/2017; she is followed by Dr. Hollie Salk & colleagues.   While hospitalized [place] density on the mitral valve was incidentally found TTE on 8/23: confirmed by TEE on 8/27. Blood cultures were negative, clinical presentation did no suggest endocarditis, per inpatient cardiology thought to be a calcified subchondral apparatus without further recommendation for workup at the time. Outpatient cardiology was recommended, she has scheduled with Dr. Johnsie Cancel 12/30.   BMI is There is no height or weight on file to calculate BMI., she has not been working on diet and exercise. Wt Readings from Last 3 Encounters:  05/03/19 160 lb (72.6 kg)  04/27/19 150 lb (68 kg)  04/26/19 150 lb (68 kg)   Her blood pressure has been controlled at home, today their BP is   not on medications secondary to dialysis She does not workout. She denies chest pain, shortness of breath, dizziness.   She is on cholesterol medication atorvastatin 10 mg daily and denies myalgias. Her cholesterol is at goal. The cholesterol last visit was:   Lab Results  Component Value Date   CHOL 103 01/10/2019   HDL 46 (L) 01/10/2019   LDLCALC 40 01/10/2019   TRIG 87 01/10/2019   CHOLHDL 2.2 01/10/2019   She has been working on diet and  exercise for Diabetes with diabetic chronic kidney disease, with other circulatory complications and with diabetic polyneuropathy, she is on bASA, she is not on ACE/ARB, she is on novolin 70/30, taking 15-20 units and 10-20 in the evening, and denies paresthesia of the feet, polydipsia, polyuria and visual disturbances. Fasting has been well controlled, 80-120. Last A1C was:  Lab Results  Component Value Date   HGBA1C 6.8 (H) 03/23/2019   She has ESRD, follows with Dr. ***, has hemodialysis *** Lab Results  Component Value Date   GFRNONAA 5 (L) 04/19/2019   Patient is on Vitamin D supplement. Lab Results  Component Value Date   VD25OH 50 01/10/2019     Patient is on allopurinol for gout and does not report a recent flare.  Lab Results  Component Value Date   LABURIC 3.6 01/10/2019  '   Medication Review Current Outpatient Medications on File Prior to Visit  Medication Sig Dispense Refill  . acetaminophen (TYLENOL) 325 MG tablet Take 1-2 tablets (325-650 mg total) by mouth every 4 (four) hours as needed for mild pain (or temp >/= 101 F). (Patient not taking: Reported on 05/26/2019)    . allopurinol (ZYLOPRIM) 100 MG tablet Take 1 tablet 2 x /day to Prevent Gout (Patient taking differently: Take 100 mg by mouth 2 (two) times daily. ) 180 tablet 3  . ARTIFICIAL TEAR SOLUTION OP Place 1 drop into both eyes in the morning and at bedtime.    Marland Kitchen aspirin EC  81 MG tablet Take 1 tablet (81 mg total) by mouth daily. (Patient taking differently: Take 81 mg by mouth daily. Skip dose if you take an excedrin migraine)    . aspirin-acetaminophen-caffeine (EXCEDRIN EXTRA STRENGTH) 250-250-65 MG tablet Take 1 tablet by mouth 2 (two) times daily as needed (pain).    Marland Kitchen atorvastatin (LIPITOR) 10 MG tablet Take 1 tablet (10 mg total) by mouth daily at 6 PM. 30 tablet 0  . b complex-vitamin c-folic acid (NEPHRO-VITE) 0.8 MG TABS tablet Take 1 tablet by mouth daily.     . Bisacodyl (LAXATIVE PO) Take 1 capsule  by mouth 3 (three) times daily.    . cinacalcet (SENSIPAR) 30 MG tablet Take 1 tablet (30 mg total) by mouth daily with supper. 60 tablet 1  . Darbepoetin Alfa (ARANESP) 100 MCG/0.5ML SOSY injection Inject 0.5 mLs (100 mcg total) into the vein every Tuesday with hemodialysis. 4.2 mL   . docusate sodium (COLACE) 100 MG capsule Take 200 mg by mouth 2 (two) times daily.     Marland Kitchen doxercalciferol (HECTOROL) 4 MCG/2ML injection Inject 2 mLs (4 mcg total) into the vein Every Tuesday,Thursday,and Saturday with dialysis. 2 mL   . ferric gluconate 125 mg in sodium chloride 0.9 % 100 mL Inject 125 mg into the vein Every Tuesday,Thursday,and Saturday with dialysis.    Marland Kitchen gabapentin (NEURONTIN) 100 MG capsule Take 1 capsule (100 mg total) by mouth 2 (two) times daily. 60 capsule 1  . Homeopathic Products (THERAWORX RELIEF EX) Apply 1 application topically daily as needed (pain).    . hydrALAZINE (APRESOLINE) 25 MG tablet Take 3 tablets (75 mg total) by mouth every 8 (eight) hours. 270 tablet 1  . HYDROcodone-acetaminophen (NORCO/VICODIN) 5-325 MG tablet Take 0.5-1 tablets by mouth every 6 (six) hours as needed for severe pain. 30 tablet 0  . insulin NPH-regular Human (70-30) 100 UNIT/ML injection Inject 10-30 Units into the skin 2 (two) times daily with a meal.     . isosorbide dinitrate (ISORDIL) 10 MG tablet Take 1 tablet 3 x /day for BP & Heart (Patient taking differently: Take 10 mg by mouth 3 (three) times daily. ) 270 tablet 3  . Multiple Vitamins-Minerals (PRESERVISION AREDS 2 PO) Take 2 capsules by mouth daily at 12 noon.     . pantoprazole (PROTONIX) 40 MG tablet Take 1 tablet (40 mg total) by mouth daily. (Patient not taking: Reported on 05/26/2019) 30 tablet 0  . polyethylene glycol (MIRALAX / GLYCOLAX) 17 g packet Take 17 g by mouth daily. (Patient not taking: Reported on 05/26/2019)    . Probiotic Product ([place]) CAPS Take 1 capsule by mouth in the morning and at bedtime.    . sevelamer  carbonate (RENVELA) 800 MG tablet Take 2 tablets (1,600 mg total) by mouth 3 (three) times daily with meals. (Patient taking differently: Take 1,600 mg by mouth See admin instructions. Take 1600 mg with each meal and each snack) 90 tablet 1  . traZODone (DESYREL) 50 MG tablet Take 50 mg by mouth at bedtime as needed for sleep.     No current facility-administered medications on file prior to visit.    Current Problems (verified) Patient Active Problem List   Diagnosis Date Noted  . Neuropathic pain   . Labile blood glucose   . Benign essential HTN   . Debility 04/07/2019  . 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 S/P femoral-popliteal bypass surgery 04/07/2019  .  Chronic bilateral low back pain without sciatica   . Abscess of right foot   . Hyperlipidemia associated with type 2 diabetes mellitus ([place]) 01/09/2019  . Diabetes mellitus due to underlying condition with chronic kidney disease on chronic dialysis, with long-term current use of insulin ([place]) 01/09/2019  . ESRD on hemodialysis ([place])   . Generalized weakness   . Mitral valve mass   . Coronary artery disease involving coronary bypass graft of native heart without angina pectoris   . Bradycardia   . Anemia 05/03/2017  . Diabetes mellitus type 2, insulin dependent ([place]) 05/08/2015  . BMI 28.0-28.9,adult 02/14/2015  . Medication management 08/04/2014  . Diabetic neuropathy ([place]) 02/17/2014  . Gouty arthritis   . Vitamin D deficiency   . Malignant neoplasm of upper-inner quadrant of left breast in female, estrogen receptor positive ([place]) 02/14/2013  . PVD (peripheral vascular disease) ([place]) 09/05/2009  . Essential hypertension 06/20/2009  . Congestive heart failure ([place]) 06/20/2009  . Asthma 06/20/2009    Screening Tests Immunization History  Administered Date(s) Administered  . Influenza, High Dose Seasonal PF 01/30/2014, 02/14/2015, 12/17/2015, 01/22/2017, 01/07/2018  .  Influenza-Unspecified 02/05/2013  . Pneumococcal Conjugate-13 01/30/2014  . Pneumococcal Polysaccharide-23 01/07/2011  . Td 01/13/2012  . Zoster 01/13/2012    Preventative care: Last colonoscopy: 2011, due 03/2020 Last mammogram: 11/2017 *** DEXA:12/2016 - normal Echo 2019   Prior vaccinations: TD or Tdap: 2013  Influenza: 2019 *** Pneumococcal: 2012  Prevnar13: 2015 Shingles/Zostavax: 2013  Names of Other Physician/Practitioners you currently use: 1. [facility]- here for primary care 2. Dr. Bing Plume, eye doctor, last visit 2017 - NEEDS TO SCHEDULE need eye report *** 3. Dr. Satira Sark, Dr. Mallie Snooks , dentist, last visit feb 2018  Patient Care Team: Unk Pinto, MD as PCP - General (Internal Medicine) Josue Hector, MD as PCP - Cardiology (Cardiology) Magrinat, Virgie Dad, MD as Consulting Physician (Oncology) Kyung Rudd, MD as Consulting Physician (Radiation Oncology) Newt Minion, MD as Consulting Physician (Orthopedic Surgery) Philemon Kingdom, MD as Consulting Physician (Internal Medicine) Josue Hector, MD as Consulting Physician (Cardiology) Inda Castle, MD (Inactive) as Consulting Physician (Gastroenterology) Rolm Bookbinder, MD as Consulting Physician (General Surgery) Calvert Cantor, MD as Consulting Physician (Ophthalmology) Madelon Lips, MD as Consulting Physician (Nephrology) Allergies Allergies  Allergen Reactions  . Atenolol Rash and Other (See Comments)    Exacerbates gout   . Adhesive [Tape] Other (See Comments)    SKIN IS VERY SENSITIVE AND BRUISES AND TEARS EASILY; PLEASE USE AN ALTERNATIVE TO TAPE!!  . Contrast Media [Iodinated Diagnostic Agents] Rash  . Iohexol Rash  . Latex Rash    SURGICAL HISTORY She  has a past surgical history that includes Cataract extraction w/ intraocular lens  implant, bilateral (2000's); Finger surgery (Right); Coronary artery bypass graft (2011); Dental surgery  (04/30/11); AMPUTATION RAY (Right, 08/27/2012); Cardiac catheterization (05/24/2009); Amputation (Right, 08/27/2012); Eye surgery; Colonoscopy w/ biopsies and polypectomy (2010); Breast lumpectomy (04/20/2013); Breast lumpectomy with needle localization and axillary sentinel lymph node bx (Left, 04/20/2013); Dilation and curettage of uterus; Amputation (Left, 07/08/2013); Amputation toe (Right, 03/06/2017); AV fistula placement (Right, 11/11/2017); IR Fluoro Guide CV Line Right (11/30/2017); TEE without cardioversion (N/A, 12/01/2017); ABDOMINAL AORTOGRAM W/LOWER EXTREMITY (Bilateral, 03/25/2019); Endarterectomy femoral (Left, 03/28/2019); Femoral-popliteal Bypass Graft (Left, 03/28/2019); Patch angioplasty (Left, 03/28/2019); and Amputation (Left, 04/06/2019). FAMILY HISTORY Her family history includes Breast cancer in her maternal aunt; Breast cancer (age of onset: 72) in her sister; Breast cancer (age of onset:  60) in her sister; Hypertension in her brother; Kidney cancer (age of onset: 63) in her brother; Throat cancer (age of onset: 59) in her father. SOCIAL HISTORY She  reports that she has never smoked. She has never used smokeless tobacco. She reports previous alcohol use. She reports that she does not use drugs.  MEDICARE WELLNESS OBJECTIVES: Physical activity:   Cardiac risk factors:   Depression/mood screen:   Depression screen PHQ 2/9 01/09/2019  Decreased Interest 0  Down, Depressed, Hopeless 0  PHQ - 2 Score 0  Some recent data might be hidden    ADLs:  In your present state of health, do you have any difficulty performing the following activities: 04/07/2019 03/24/2019  Hearing? - Y  Vision? - N  Difficulty concentrating or making decisions? - N  Walking or climbing stairs? - N  Dressing or bathing? - N  Doing errands, shopping? N N  Some recent data might be hidden     Cognitive Testing  Alert? Yes  Normal Appearance?Yes  Oriented to person? Yes  Place? Yes   Time? Yes  Recall of  three objects?  Yes  Can perform simple calculations? Yes  Displays appropriate judgment?Yes  Can read the correct time from a watch face?Yes  EOL planning:     Objective:   There were no vitals filed for this visit. There is no height or weight on file to calculate BMI.  General appearance: alert, no distress, WD/WN,  female HEENT: normocephalic, sclerae anicteric, TMs pearly, nares patent, no discharge or erythema, pharynx normal Oral cavity: MMM, no lesions Neck: supple, no lymphadenopathy, no thyromegaly, no masses Heart: RRR, normal S1, S2, no murmurs Lungs: CTA bilaterally, no wheezes, rhonchi, or rales Abdomen: +bs, soft, non tender, non distended, no masses, no hepatomegaly, no splenomegaly Musculoskeletal: nontender, no swelling, no obvious deformity Extremities: no edema, no cyanosis, no clubbing; fistula to RUE intact with thrill, catheter to chest with clean intact dressing Pulses: 2+ symmetric, upper and lower extremities, normal cap refill Neurological: alert, oriented x 3, CN2-12 intact, strength normal upper extremities and lower extremities, sensation normal throughout, DTRs 2+ throughout, no cerebellar signs, gait normal Psychiatric: normal affect, behavior normal, pleasant  Breast: defer Gyn: defer Rectal: defer   Medicare Attestation I have personally reviewed: The patient's medical and social history Their use of alcohol, tobacco or illicit drugs Their current medications and supplements The patient's functional ability including ADLs,fall risks, home safety risks, cognitive, and hearing and visual impairment Diet and physical activities Evidence for depression or mood disorders  The patient's weight, height, BMI, and visual acuity have been recorded in the chart.  I have made referrals, counseling, and provided education to the patient based on review of the above and I have provided the patient with a written personalized care plan for preventive services.      [name], NP   05/26/2019

## 2019-05-27 ENCOUNTER — Ambulatory Visit (HOSPITAL_COMMUNITY)
Admission: RE | Admit: 2019-05-27 | Discharge: 2019-05-27 | Disposition: A | Discharge status: Home / Self Care | Payer: Medicare Other | Attending: Orthopaedic Surgery | Admitting: Orthopaedic Surgery

## 2019-05-27 ENCOUNTER — Other Ambulatory Visit: Payer: Self-pay

## 2019-05-27 ENCOUNTER — Encounter (HOSPITAL_COMMUNITY)
Admission: RE | Disposition: A | Discharge status: Home / Self Care | Payer: Self-pay | Source: Home / Self Care | Attending: Orthopaedic Surgery

## 2019-05-27 ENCOUNTER — Ambulatory Visit (HOSPITAL_COMMUNITY): Payer: Medicare Other | Admitting: Anesthesiology

## 2019-05-27 ENCOUNTER — Ambulatory Visit (HOSPITAL_COMMUNITY): Payer: Medicare Other

## 2019-05-27 ENCOUNTER — Other Ambulatory Visit: Payer: Self-pay | Admitting: Physician Assistant

## 2019-05-27 ENCOUNTER — Encounter (HOSPITAL_COMMUNITY): Payer: Self-pay | Admitting: Orthopaedic Surgery

## 2019-05-27 ENCOUNTER — Ambulatory Visit: Payer: Medicare Other | Admitting: Adult Health

## 2019-05-27 DIAGNOSIS — S43015A Anterior dislocation of left humerus, initial encounter: Secondary | ICD-10-CM

## 2019-05-27 DIAGNOSIS — Z9841 Cataract extraction status, right eye: Secondary | ICD-10-CM | POA: Insufficient documentation

## 2019-05-27 DIAGNOSIS — Z803 Family history of malignant neoplasm of breast: Secondary | ICD-10-CM | POA: Insufficient documentation

## 2019-05-27 DIAGNOSIS — E1151 Type 2 diabetes mellitus with diabetic peripheral angiopathy without gangrene: Secondary | ICD-10-CM | POA: Diagnosis not present

## 2019-05-27 DIAGNOSIS — Z89422 Acquired absence of other left toe(s): Secondary | ICD-10-CM | POA: Insufficient documentation

## 2019-05-27 DIAGNOSIS — I252 Old myocardial infarction: Secondary | ICD-10-CM | POA: Diagnosis not present

## 2019-05-27 DIAGNOSIS — N186 End stage renal disease: Secondary | ICD-10-CM | POA: Diagnosis not present

## 2019-05-27 DIAGNOSIS — S43005A Unspecified dislocation of left shoulder joint, initial encounter: Secondary | ICD-10-CM | POA: Insufficient documentation

## 2019-05-27 DIAGNOSIS — E1122 Type 2 diabetes mellitus with diabetic chronic kidney disease: Secondary | ICD-10-CM | POA: Diagnosis not present

## 2019-05-27 DIAGNOSIS — E785 Hyperlipidemia, unspecified: Secondary | ICD-10-CM | POA: Diagnosis not present

## 2019-05-27 DIAGNOSIS — N184 Chronic kidney disease, stage 4 (severe): Secondary | ICD-10-CM | POA: Insufficient documentation

## 2019-05-27 DIAGNOSIS — W19XXXA Unspecified fall, initial encounter: Secondary | ICD-10-CM | POA: Diagnosis not present

## 2019-05-27 DIAGNOSIS — Z794 Long term (current) use of insulin: Secondary | ICD-10-CM | POA: Insufficient documentation

## 2019-05-27 DIAGNOSIS — M10041 Idiopathic gout, right hand: Secondary | ICD-10-CM | POA: Diagnosis not present

## 2019-05-27 DIAGNOSIS — Z923 Personal history of irradiation: Secondary | ICD-10-CM | POA: Insufficient documentation

## 2019-05-27 DIAGNOSIS — Z79899 Other long term (current) drug therapy: Secondary | ICD-10-CM | POA: Insufficient documentation

## 2019-05-27 DIAGNOSIS — I2510 Atherosclerotic heart disease of native coronary artery without angina pectoris: Secondary | ICD-10-CM | POA: Diagnosis not present

## 2019-05-27 DIAGNOSIS — I340 Nonrheumatic mitral (valve) insufficiency: Secondary | ICD-10-CM | POA: Diagnosis not present

## 2019-05-27 DIAGNOSIS — Z800 Family history of malignant neoplasm of digestive organs: Secondary | ICD-10-CM | POA: Insufficient documentation

## 2019-05-27 DIAGNOSIS — G8918 Other acute postprocedural pain: Secondary | ICD-10-CM | POA: Diagnosis not present

## 2019-05-27 DIAGNOSIS — Z992 Dependence on renal dialysis: Secondary | ICD-10-CM | POA: Insufficient documentation

## 2019-05-27 DIAGNOSIS — Z91041 Radiographic dye allergy status: Secondary | ICD-10-CM | POA: Insufficient documentation

## 2019-05-27 DIAGNOSIS — Z91048 Other nonmedicinal substance allergy status: Secondary | ICD-10-CM | POA: Insufficient documentation

## 2019-05-27 DIAGNOSIS — Z961 Presence of intraocular lens: Secondary | ICD-10-CM | POA: Insufficient documentation

## 2019-05-27 DIAGNOSIS — Z8051 Family history of malignant neoplasm of kidney: Secondary | ICD-10-CM | POA: Insufficient documentation

## 2019-05-27 DIAGNOSIS — E1140 Type 2 diabetes mellitus with diabetic neuropathy, unspecified: Secondary | ICD-10-CM | POA: Diagnosis not present

## 2019-05-27 DIAGNOSIS — Z888 Allergy status to other drugs, medicaments and biological substances status: Secondary | ICD-10-CM | POA: Insufficient documentation

## 2019-05-27 DIAGNOSIS — I5042 Chronic combined systolic (congestive) and diastolic (congestive) heart failure: Secondary | ICD-10-CM | POA: Insufficient documentation

## 2019-05-27 DIAGNOSIS — Z91040 Latex allergy status: Secondary | ICD-10-CM | POA: Insufficient documentation

## 2019-05-27 DIAGNOSIS — J45909 Unspecified asthma, uncomplicated: Secondary | ICD-10-CM | POA: Insufficient documentation

## 2019-05-27 DIAGNOSIS — I130 Hypertensive heart and chronic kidney disease with heart failure and stage 1 through stage 4 chronic kidney disease, or unspecified chronic kidney disease: Secondary | ICD-10-CM | POA: Diagnosis not present

## 2019-05-27 DIAGNOSIS — Z419 Encounter for procedure for purposes other than remedying health state, unspecified: Secondary | ICD-10-CM

## 2019-05-27 DIAGNOSIS — Z9842 Cataract extraction status, left eye: Secondary | ICD-10-CM | POA: Diagnosis not present

## 2019-05-27 DIAGNOSIS — Z853 Personal history of malignant neoplasm of breast: Secondary | ICD-10-CM | POA: Insufficient documentation

## 2019-05-27 DIAGNOSIS — I255 Ischemic cardiomyopathy: Secondary | ICD-10-CM | POA: Insufficient documentation

## 2019-05-27 DIAGNOSIS — E559 Vitamin D deficiency, unspecified: Secondary | ICD-10-CM | POA: Diagnosis not present

## 2019-05-27 DIAGNOSIS — Z89421 Acquired absence of other right toe(s): Secondary | ICD-10-CM | POA: Insufficient documentation

## 2019-05-27 DIAGNOSIS — Z951 Presence of aortocoronary bypass graft: Secondary | ICD-10-CM | POA: Insufficient documentation

## 2019-05-27 DIAGNOSIS — S43015D Anterior dislocation of left humerus, subsequent encounter: Secondary | ICD-10-CM

## 2019-05-27 DIAGNOSIS — Z8249 Family history of ischemic heart disease and other diseases of the circulatory system: Secondary | ICD-10-CM | POA: Insufficient documentation

## 2019-05-27 DIAGNOSIS — Z7982 Long term (current) use of aspirin: Secondary | ICD-10-CM | POA: Insufficient documentation

## 2019-05-27 DIAGNOSIS — I447 Left bundle-branch block, unspecified: Secondary | ICD-10-CM | POA: Insufficient documentation

## 2019-05-27 HISTORY — PX: SHOULDER CLOSED REDUCTION: SHX1051

## 2019-05-27 HISTORY — DX: Anterior dislocation of left humerus, initial encounter: S43.015A

## 2019-05-27 LAB — CBC
HCT: 34.6 % — ABNORMAL LOW (ref 36.0–46.0)
Hemoglobin: 10.9 g/dL — ABNORMAL LOW (ref 12.0–15.0)
MCH: 29.2 pg (ref 26.0–34.0)
MCHC: 31.5 g/dL (ref 30.0–36.0)
MCV: 92.8 fL (ref 80.0–100.0)
Platelets: 228 10*3/uL (ref 150–400)
RBC: 3.73 MIL/uL — ABNORMAL LOW (ref 3.87–5.11)
RDW: 17.1 % — ABNORMAL HIGH (ref 11.5–15.5)
WBC: 8.1 10*3/uL (ref 4.0–10.5)
nRBC: 0 % (ref 0.0–0.2)

## 2019-05-27 LAB — BASIC METABOLIC PANEL
Anion gap: 17 — ABNORMAL HIGH (ref 5–15)
BUN: 79 mg/dL — ABNORMAL HIGH (ref 8–23)
CO2: 26 mmol/L (ref 22–32)
Calcium: 8 mg/dL — ABNORMAL LOW (ref 8.9–10.3)
Chloride: 95 mmol/L — ABNORMAL LOW (ref 98–111)
Creatinine, Ser: 3.37 mg/dL — ABNORMAL HIGH (ref 0.44–1.00)
GFR calc Af Amer: 15 mL/min — ABNORMAL LOW (ref >60)
GFR calc non Af Amer: 13 mL/min — ABNORMAL LOW (ref >60)
Glucose, Bld: 94 mg/dL (ref 70–99)
Potassium: 3.8 mmol/L (ref 3.5–5.1)
Sodium: 138 mmol/L (ref 135–145)

## 2019-05-27 LAB — GLUCOSE, CAPILLARY
Glucose-Capillary: 90 mg/dL (ref 70–99)
Glucose-Capillary: 99 mg/dL (ref 70–99)

## 2019-05-27 LAB — HEMOGLOBIN A1C
Hgb A1c MFr Bld: 5.7 % — ABNORMAL HIGH (ref 4.8–5.6)
Mean Plasma Glucose: 116.89 mg/dL

## 2019-05-27 SURGERY — CLOSED REDUCTION, SHOULDER
Anesthesia: General | Site: Shoulder | Laterality: Left

## 2019-05-27 MED ORDER — CHLORHEXIDINE GLUCONATE 4 % EX LIQD: 60.0000 mL | Freq: Once | CUTANEOUS | Status: DC

## 2019-05-27 MED ORDER — EPHEDRINE 5 MG/ML INJ
INTRAVENOUS | Status: AC
  Filled 2019-05-27: qty 10

## 2019-05-27 MED ORDER — PROPOFOL 10 MG/ML IV BOLUS
INTRAVENOUS | Status: AC
  Filled 2019-05-27: qty 20

## 2019-05-27 MED ORDER — MIDAZOLAM HCL 2 MG/2ML IJ SOLN
INTRAMUSCULAR | Status: AC
  Filled 2019-05-27: qty 2

## 2019-05-27 MED ORDER — FENTANYL CITRATE (PF) 100 MCG/2ML IJ SOLN
50.0000 ug | INTRAMUSCULAR | Status: DC
  Administered 2019-05-27: 50 ug via INTRAVENOUS

## 2019-05-27 MED ORDER — HYDROCODONE-ACETAMINOPHEN 5-325 MG PO TABS: 1.0000 | ORAL_TABLET | Freq: Four times a day (QID) | ORAL | 0 refills | Status: DC | PRN

## 2019-05-27 MED ORDER — MIDAZOLAM HCL 2 MG/2ML IJ SOLN
1.0000 mg | INTRAMUSCULAR | Status: DC
  Administered 2019-05-27: 1 mg via INTRAVENOUS

## 2019-05-27 MED ORDER — CISATRACURIUM BESYLATE 20 MG/10ML IV SOLN
INTRAVENOUS | Status: AC
  Filled 2019-05-27: qty 10

## 2019-05-27 MED ORDER — LIDOCAINE 2% (20 MG/ML) 5 ML SYRINGE
INTRAMUSCULAR | Status: AC
  Filled 2019-05-27: qty 5

## 2019-05-27 MED ORDER — FENTANYL CITRATE (PF) 100 MCG/2ML IJ SOLN
INTRAMUSCULAR | Status: AC
  Filled 2019-05-27: qty 2

## 2019-05-27 MED ORDER — SODIUM CHLORIDE 0.9 % IV SOLN: INTRAVENOUS | Status: DC

## 2019-05-27 MED ORDER — ONDANSETRON HCL 4 MG/2ML IJ SOLN
INTRAMUSCULAR | Status: AC
  Filled 2019-05-27: qty 2

## 2019-05-27 MED ORDER — DEXAMETHASONE SODIUM PHOSPHATE 10 MG/ML IJ SOLN
INTRAMUSCULAR | Status: DC | PRN
  Administered 2019-05-27: 8 mg via INTRAVENOUS

## 2019-05-27 MED ORDER — FENTANYL CITRATE (PF) 100 MCG/2ML IJ SOLN
INTRAMUSCULAR | Status: DC | PRN
  Administered 2019-05-27: 50 ug via INTRAVENOUS

## 2019-05-27 MED ORDER — ACETAMINOPHEN 500 MG PO TABS
1000.0000 mg | ORAL_TABLET | Freq: Once | ORAL | Status: AC
  Administered 2019-05-27: 1000 mg via ORAL
  Filled 2019-05-27: qty 2

## 2019-05-27 MED ORDER — LIDOCAINE 2% (20 MG/ML) 5 ML SYRINGE
INTRAMUSCULAR | Status: DC | PRN
  Administered 2019-05-27: 60 mg via INTRAVENOUS

## 2019-05-27 MED ORDER — DEXAMETHASONE SODIUM PHOSPHATE 10 MG/ML IJ SOLN
INTRAMUSCULAR | Status: AC
  Filled 2019-05-27: qty 1

## 2019-05-27 MED ORDER — ROPIVACAINE HCL 5 MG/ML IJ SOLN
INTRAMUSCULAR | Status: DC | PRN
  Administered 2019-05-27: 20 mL via PERINEURAL

## 2019-05-27 MED ORDER — PROPOFOL 10 MG/ML IV BOLUS
INTRAVENOUS | Status: DC | PRN
  Administered 2019-05-27: 120 mg via INTRAVENOUS

## 2019-05-27 MED ORDER — EPHEDRINE SULFATE-NACL 50-0.9 MG/10ML-% IV SOSY
PREFILLED_SYRINGE | INTRAVENOUS | Status: DC | PRN
  Administered 2019-05-27 (×2): 10 mg via INTRAVENOUS

## 2019-05-27 SURGICAL SUPPLY — 36 items
BAG SPEC THK2 15X12 ZIP CLS (MISCELLANEOUS) ×1
BAG ZIPLOCK 12X15 (MISCELLANEOUS) ×3 IMPLANT
CLOSURE WOUND 1/2 X4 (GAUZE/BANDAGES/DRESSINGS) ×1
COVER SURGICAL LIGHT HANDLE (MISCELLANEOUS) ×3 IMPLANT
COVER WAND RF STERILE (DRAPES) IMPLANT
CUFF TOURN SGL QUICK 18X4 (TOURNIQUET CUFF) ×3 IMPLANT
DRAPE C-ARM 42X120 X-RAY (DRAPES) ×3 IMPLANT
DRAPE U-SHAPE 47X51 STRL (DRAPES) ×3 IMPLANT
DRSG EMULSION OIL 3X3 NADH (GAUZE/BANDAGES/DRESSINGS) ×3 IMPLANT
DRSG PAD ABDOMINAL 8X10 ST (GAUZE/BANDAGES/DRESSINGS) ×3 IMPLANT
DURAPREP 26ML APPLICATOR (WOUND CARE) ×3 IMPLANT
ELECT REM PT RETURN 15FT ADLT (MISCELLANEOUS) ×3 IMPLANT
GAUZE SPONGE 4X4 12PLY STRL (GAUZE/BANDAGES/DRESSINGS) ×3 IMPLANT
GLOVE BIO SURGEON STRL SZ7.5 (GLOVE) ×3 IMPLANT
GLOVE BIOGEL PI IND STRL 8 (GLOVE) ×1 IMPLANT
GLOVE BIOGEL PI INDICATOR 8 (GLOVE) ×2
GLOVE ECLIPSE 8.0 STRL XLNG CF (GLOVE) ×3 IMPLANT
GOWN STRL REUS W/TWL XL LVL3 (GOWN DISPOSABLE) ×3 IMPLANT
KIT TURNOVER KIT A (KITS) IMPLANT
NS IRRIG 1000ML POUR BTL (IV SOLUTION) ×3 IMPLANT
PACK ORTHO EXTREMITY (CUSTOM PROCEDURE TRAY) ×3 IMPLANT
PACK SHOULDER (CUSTOM PROCEDURE TRAY) ×3 IMPLANT
PAD CAST 4YDX4 CTTN HI CHSV (CAST SUPPLIES) ×1 IMPLANT
PADDING CAST COTTON 4X4 STRL (CAST SUPPLIES) ×3
PENCIL SMOKE EVACUATOR (MISCELLANEOUS) IMPLANT
PROTECTOR NERVE ULNAR (MISCELLANEOUS) ×3 IMPLANT
SLING ARM FOAM STRAP LRG (SOFTGOODS) ×3 IMPLANT
SLING ARM FOAM STRAP MED (SOFTGOODS) ×3 IMPLANT
STRIP CLOSURE SKIN 1/2X4 (GAUZE/BANDAGES/DRESSINGS) ×2 IMPLANT
SUT MNCRL AB 4-0 PS2 18 (SUTURE) ×3 IMPLANT
SUT VIC AB 1 CT1 27 (SUTURE) ×6
SUT VIC AB 1 CT1 27XBRD ANTBC (SUTURE) ×2 IMPLANT
SUT VIC AB 2-0 CT1 27 (SUTURE) ×6
SUT VIC AB 2-0 CT1 TAPERPNT 27 (SUTURE) ×2 IMPLANT
TOWEL OR 17X26 10 PK STRL BLUE (TOWEL DISPOSABLE) ×9 IMPLANT
WATER STERILE IRR 1000ML POUR (IV SOLUTION) ×3 IMPLANT

## 2019-05-27 NOTE — Anesthesia Procedure Notes (Signed)
Anesthesia Regional Block: Interscalene brachial plexus block   Pre-Anesthetic Checklist: ,, timeout performed, Correct Patient, Correct Site, Correct Laterality, Correct Procedure, Correct Position, site marked, Risks and benefits discussed,  Surgical consent,  Pre-op evaluation,  At surgeon's request and post-op pain management  Laterality: Left  Prep: chloraprep       Needles:  Injection technique: Single-shot  Needle Type: Echogenic Needle     Needle Length: 9cm  Needle Gauge: 21     Additional Needles:   Procedures:,,,, ultrasound used (permanent image in chart),,,,  Narrative:  Start time: 05/27/2019 12:38 PM End time: 05/27/2019 12:48 PM Injection made incrementally with aspirations every 5 mL.  Performed by: Personally  Anesthesiologist: Catalina Gravel, MD  Additional Notes: No pain on injection. No increased resistance to injection. Injection made in 5cc increments.  Good needle visualization.  Patient tolerated procedure well.

## 2019-05-27 NOTE — Anesthesia Procedure Notes (Signed)
Procedure Name: Intubation Date/Time: 05/27/2019 1:28 PM Performed by: Victoriano Lain, CRNA Pre-anesthesia Checklist: Patient identified, Emergency Drugs available, Suction available, Patient being monitored and Timeout performed Patient Re-evaluated:Patient Re-evaluated prior to induction Oxygen Delivery Method: Circle system utilized Preoxygenation: Pre-oxygenation with 100% oxygen Induction Type: IV induction Ventilation: Mask ventilation without difficulty Laryngoscope Size: Mac and 4 Grade View: Grade I Tube type: Oral Tube size: 7.5 mm Number of attempts: 1 Airway Equipment and Method: Stylet Placement Confirmation: ETT inserted through vocal cords under direct vision,  positive ETCO2 and breath sounds checked- equal and bilateral Secured at: 21 cm Tube secured with: Tape Dental Injury: Teeth and Oropharynx as per pre-operative assessment

## 2019-05-27 NOTE — Brief Op Note (Signed)
05/27/2019  1:50 PM  PATIENT:  [name]  74 y.o. female  PRE-OPERATIVE DIAGNOSIS:  left shoulder dislocation  POST-OPERATIVE DIAGNOSIS:  left shoulder dislocation  PROCEDURE:  Procedure(s): CLOSED REDUCTION LEFT SHOULDER (Left)  SURGEON:  Surgeon(s) and Role:    Mcarthur Rossetti, MD - Primary  ANESTHESIA:   regional and general  DICTATION: .Other Dictation: Dictation Number [id number]  PLAN OF CARE: Discharge to home after PACU  PATIENT DISPOSITION:  PACU - hemodynamically stable.   Delay start of Pharmacological VTE agent (>24hrs) due to surgical blood loss or risk of bleeding: not applicable

## 2019-05-27 NOTE — Transfer of Care (Signed)
Immediate Anesthesia Transfer of Care Note  Patient: [name]  Procedure(s) Performed: CLOSED REDUCTION LEFT SHOULDER (Left Shoulder)  Patient Location: PACU  Anesthesia Type:General  Level of Consciousness: awake, alert , oriented and patient cooperative  Airway & Oxygen Therapy: Patient Spontanous Breathing and Patient connected to face mask oxygen  Post-op Assessment: Report given to RN, Post -op Vital signs reviewed and stable and Patient moving all extremities  Post vital signs: Reviewed and stable  Last Vitals:  Vitals Value Taken Time  BP 104/49 05/27/19 1357  Temp    Pulse 88 05/27/19 1359  Resp 13 05/27/19 1359  SpO2 98 % 05/27/19 1359  Vitals shown include unvalidated device data.  Last Pain:  Vitals:   05/27/19 1234  TempSrc:   PainSc: 0-No pain         Complications: No apparent anesthesia complications

## 2019-05-27 NOTE — Progress Notes (Signed)
Pt discharged in NAD, VSS, no pain. Pt and husband given discharge instructions. All questions answered. IV taken out. Sling intact. Pt discharged by wheelchair home with husband.

## 2019-05-27 NOTE — Anesthesia Postprocedure Evaluation (Signed)
Anesthesia Post Note  Patient: [name]  Procedure(s) Performed: CLOSED REDUCTION LEFT SHOULDER (Left Shoulder)     Patient location during evaluation: PACU Anesthesia Type: General and Regional Level of consciousness: awake and alert Pain management: pain level controlled Vital Signs Assessment: post-procedure vital signs reviewed and stable Respiratory status: spontaneous breathing, nonlabored ventilation, respiratory function stable and patient connected to nasal cannula oxygen Cardiovascular status: blood pressure returned to baseline and stable Postop Assessment: no apparent nausea or vomiting Anesthetic complications: no    Last Vitals:  Vitals:   05/27/19 1445 05/27/19 1505  BP: 137/62 (!) 117/59  Pulse: 88 86  Resp: 20 16  Temp: 36.6 C   SpO2: 92% 93%    Last Pain:  Vitals:   05/27/19 1505  TempSrc:   PainSc: 0-No pain                 Wing Gfeller P Machaela Caterino

## 2019-05-27 NOTE — Progress Notes (Signed)
Assisted Dr. Turk with left, ultrasound guided, interscalene  block. Side rails up, monitors on throughout procedure. See vital signs in flow sheet. Tolerated Procedure well. 

## 2019-05-27 NOTE — H&P (Signed)
[name] is an 74 y.o. female.   Chief Complaint: Left shoulder pain with known anterior dislocation HPI: Patient is a 74 year old female who presented to my clinic 2 days ago with left shoulder pain a week out from a mechanical accidental fall.  She states that since that time she had had difficulty putting her clothes on using her left shoulder.  She did develop bruising down her arm and significant swelling in her left hand.  When she can my clinic we recognized that she had a anterior shoulder dislocation.  This is recognized photographically and clinically.  She is neurovascularly intact.  We have described the need for proceeding to surgery for an open versus closed reduction of the shoulder based on it being a recent dislocation.  Past Medical History:  Diagnosis Date  . Allergy   . Arthritis    "maybe in my lower back" (08/27/2012)  . 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 Chronic combined systolic and diastolic CHF (congestive heart failure) ([place])   . CKD (chronic kidney disease), stage IV ([place])   . Coronary artery disease    a. NSTEMI in 05/2009 s/p CABG (LIMA-LAD, SVG-diagonal, SVG-OM1/OM 2).   Marland Kitchen Dyspnea    with exertion  . Family history of anesthesia complication    Mom has a hard time to wake up  . Foot ulcer ([place])    "I've had them on both feet" (08/27/2012)  . Gouty arthritis    "right index finger" (08/27/2012)  . History of radiation therapy 06/09/13-07/06/13   left breast 50Gy  . Hyperlipidemia   . Hypertension   . Infection    right second toe  . Ischemic cardiomyopathy    a. 2011: EF 40-45%. b. EF 55-60% in 04/2017 but shortly after as inpatient was 45-50%.  Marland Kitchen LBBB (left bundle branch block)   . Mitral regurgitation   . Neuropathy    Hx; of B/L feet  . NSTEMI (non-ST elevated myocardial infarction) ([place]) 05/23/2009  . Osteomyelitis of foot ([place])   . PAD (peripheral artery disease) ([facility])    a. LE PAD (patient  previously elected hold off L fem-pop), prev followed by Dr. Bridgett Larsson  . PONV (postoperative nausea and vomiting)   . Sinus headache    "occasionally" (08/27/2012)  . Type II diabetes mellitus ([place])   . Vitamin D deficiency     Past Surgical History:  Procedure Laterality Date  . ABDOMINAL AORTOGRAM W/LOWER EXTREMITY Bilateral 03/25/2019   Procedure: ABDOMINAL AORTOGRAM W/LOWER EXTREMITY;  Surgeon: Marty Heck, MD;  Location: [facility];  Service: Cardiovascular;  Laterality: Bilateral;  . AMPUTATION Right 08/27/2012   Procedure: AMPUTATION RAY;  Surgeon: Newt Minion, MD;  Location: [facility];  Service: Orthopedics;  Laterality: Right;  Right Foot 5th Ray Amputation  . AMPUTATION Left 07/08/2013   Procedure: AMPUTATION RAY;  Surgeon: Newt Minion, MD;  Location: [facility];  Service: Orthopedics;  Laterality: Left;  Left Foot 2nd Ray Amputation  . AMPUTATION Left 04/06/2019   Procedure: LEFT 5TH RAY AMPUTATION;  Surgeon: Newt Minion, MD;  Location: [facility];  Service: Orthopedics;  Laterality: Left;  . AMPUTATION RAY Right 08/27/2012   5th ray/notes 08/27/2012  . AMPUTATION TOE Right 03/06/2017   Procedure: AMPUTATION TOE, INTERPHANGEAL 2ND RIGHT;  Surgeon: [name], DPM;  Location: [facility];  Service: Podiatry;  Laterality: Right;  . AV FISTULA PLACEMENT Right 11/11/2017   Procedure: RIGHT RADIOCEPHALIC  ARTERIOVENOUS FISTULA;  Surgeon: [name], MD;  Location: [facility];  Service: Vascular;  Laterality: Right;  . BREAST LUMPECTOMY  04/20/2013   with biopsy      DR WAKEFIELD  . BREAST LUMPECTOMY WITH NEEDLE LOCALIZATION AND AXILLARY SENTINEL LYMPH NODE BX Left 04/20/2013   Procedure: LEFT BREAST WIRE GUIDED LUMPECTOMY AND AXILLARY SENTINEL LYMPH NODE BX;  Surgeon: Rolm Bookbinder, MD;  Location: [facility];  Service: General;  Laterality: Left;  . CARDIAC CATHETERIZATION  05/24/2009   Archie Endo 05/24/2009 (08/27/2012)  . CATARACT EXTRACTION W/ INTRAOCULAR LENS  IMPLANT, BILATERAL  2000's   . COLONOSCOPY W/ BIOPSIES AND POLYPECTOMY  2010  . CORONARY ARTERY BYPASS GRAFT  2011   "CABG X4" (08/27/2012)  . DENTAL SURGERY  04/30/11   "1 implant" (08/27/2012)  . DILATION AND CURETTAGE OF UTERUS    . ENDARTERECTOMY FEMORAL Left 03/28/2019   Procedure: ENDARTERECTOMY FEMORAL;  Surgeon: [name], MD;  Location: [facility];  Service: Vascular;  Laterality: Left;  . EYE SURGERY    . FEMORAL-POPLITEAL BYPASS GRAFT Left 03/28/2019   Procedure: BYPASS GRAFT FEMORAL-POPLITEAL ARTERY;  Surgeon: [name], MD;  Location: [facility];  Service: Vascular;  Laterality: Left;  . FINGER SURGERY Right    "index finger; turned out to be gout" (08/27/2012)  . IR FLUORO GUIDE CV LINE RIGHT  11/30/2017  . PATCH ANGIOPLASTY Left 03/28/2019   Procedure: Patch Angioplasty;  Surgeon: [name], MD;  Location: [facility];  Service: Vascular;  Laterality: Left;  . TEE WITHOUT CARDIOVERSION N/A 12/01/2017   Procedure: TRANSESOPHAGEAL ECHOCARDIOGRAM (TEE);  Surgeon: Lelon Perla, MD;  Location: [facility];  Service: Cardiovascular;  Laterality: N/A;    Family History  Problem Relation Age of Onset  . Kidney cancer Brother 12  . Hypertension Brother   . Breast cancer Sister 73       LCIS; BRCA negative  . Throat cancer Father 65       smoker  . Breast cancer Sister 43       Lobular breast cancer  . Breast cancer Maternal Aunt        dx in her 58s   Social History:  reports that she has never smoked. She has never used smokeless tobacco. She reports previous alcohol use. She reports that she does not use drugs.  Allergies:  Allergies  Allergen Reactions  . Atenolol Rash and Other (See Comments)    Exacerbates gout   . Adhesive [Tape] Other (See Comments)    SKIN IS VERY SENSITIVE AND BRUISES AND TEARS EASILY; PLEASE USE AN ALTERNATIVE TO TAPE!!  . Contrast Media [Iodinated Diagnostic Agents] Rash  . Iohexol Rash  . Latex Rash    Medications Prior to Admission  Medication Sig Dispense Refill   . allopurinol (ZYLOPRIM) 100 MG tablet Take 1 tablet 2 x /day to Prevent Gout (Patient taking differently: Take 100 mg by mouth 2 (two) times daily. ) 180 tablet 3  . ARTIFICIAL TEAR SOLUTION OP Place 1 drop into both eyes in the morning and at bedtime.    Marland Kitchen aspirin EC 81 MG tablet Take 1 tablet (81 mg total) by mouth daily. (Patient taking differently: Take 81 mg by mouth daily. Skip dose if you take an excedrin migraine)    . aspirin-acetaminophen-caffeine (EXCEDRIN EXTRA STRENGTH) 250-250-65 MG tablet Take 1 tablet by mouth 2 (two) times daily as needed (pain).    Marland Kitchen atorvastatin (LIPITOR) 10 MG tablet Take 1 tablet (10 mg total)  by mouth daily at 6 PM. 30 tablet 0  . b complex-vitamin c-folic acid (NEPHRO-VITE) 0.8 MG TABS tablet Take 1 tablet by mouth daily.     . Bisacodyl (LAXATIVE PO) Take 1 capsule by mouth 3 (three) times daily.    . cinacalcet (SENSIPAR) 30 MG tablet Take 1 tablet (30 mg total) by mouth daily with supper. 60 tablet 1  . docusate sodium (COLACE) 100 MG capsule Take 200 mg by mouth 2 (two) times daily.     Marland Kitchen gabapentin (NEURONTIN) 100 MG capsule Take 1 capsule (100 mg total) by mouth 2 (two) times daily. 60 capsule 1  . Homeopathic Products (THERAWORX RELIEF EX) Apply 1 application topically daily as needed (pain).    . hydrALAZINE (APRESOLINE) 25 MG tablet Take 3 tablets (75 mg total) by mouth every 8 (eight) hours. 270 tablet 1  . HYDROcodone-acetaminophen (NORCO/VICODIN) 5-325 MG tablet Take 0.5-1 tablets by mouth every 6 (six) hours as needed for severe pain. 30 tablet 0  . insulin NPH-regular Human (70-30) 100 UNIT/ML injection Inject 10-30 Units into the skin 2 (two) times daily with a meal.     . isosorbide dinitrate (ISORDIL) 10 MG tablet Take 1 tablet 3 x /day for BP & Heart (Patient taking differently: Take 10 mg by mouth 3 (three) times daily. ) 270 tablet 3  . Multiple Vitamins-Minerals (PRESERVISION AREDS 2 PO) Take 2 capsules by mouth daily at 12 noon.     .  Probiotic Product ([place]) CAPS Take 1 capsule by mouth in the morning and at bedtime.    . sevelamer carbonate (RENVELA) 800 MG tablet Take 2 tablets (1,600 mg total) by mouth 3 (three) times daily with meals. (Patient taking differently: Take 1,600 mg by mouth See admin instructions. Take 1600 mg with each meal and each snack) 90 tablet 1  . traZODone (DESYREL) 50 MG tablet Take 50 mg by mouth at bedtime as needed for sleep.    Marland Kitchen acetaminophen (TYLENOL) 325 MG tablet Take 1-2 tablets (325-650 mg total) by mouth every 4 (four) hours as needed for mild pain (or temp >/= 101 F). (Patient not taking: Reported on 05/26/2019)    . Darbepoetin Alfa (ARANESP) 100 MCG/0.5ML SOSY injection Inject 0.5 mLs (100 mcg total) into the vein every Tuesday with hemodialysis. 4.2 mL   . doxercalciferol (HECTOROL) 4 MCG/2ML injection Inject 2 mLs (4 mcg total) into the vein Every Tuesday,Thursday,and Saturday with dialysis. 2 mL   . ferric gluconate 125 mg in sodium chloride 0.9 % 100 mL Inject 125 mg into the vein Every Tuesday,Thursday,and Saturday with dialysis.    Marland Kitchen pantoprazole (PROTONIX) 40 MG tablet Take 1 tablet (40 mg total) by mouth daily. (Patient not taking: Reported on 05/26/2019) 30 tablet 0  . polyethylene glycol (MIRALAX / GLYCOLAX) 17 g packet Take 17 g by mouth daily. (Patient not taking: Reported on 05/26/2019)      Results for orders placed or performed during the hospital encounter of 05/27/19 (from the past 48 hour(s))  Glucose, capillary     Status: None   Collection Time: 05/27/19 10:44 AM  Result Value Ref Range   Glucose-Capillary 90 70 - 99 mg/dL  Hemoglobin A1c     Status: Abnormal   Collection Time: 05/27/19 11:30 AM  Result Value Ref Range   Hgb A1c MFr Bld 5.7 (H) 4.8 - 5.6 %    Comment: (NOTE) Pre diabetes:          5.7%-6.4% Diabetes:              >  6.4% Glycemic control for   <7.0% adults with diabetes    Mean Plasma Glucose 116.89 mg/dL    Comment: Performed at  [facility] [address] [place], [place] [address]  Basic metabolic panel     Status: Abnormal   Collection Time: 05/27/19 11:30 AM  Result Value Ref Range   Sodium 138 135 - 145 mmol/L   Potassium 3.8 3.5 - 5.1 mmol/L   Chloride 95 (L) 98 - 111 mmol/L   CO2 26 22 - 32 mmol/L   Glucose, Bld 94 70 - 99 mg/dL   BUN 79 (H) 8 - 23 mg/dL   Creatinine, Ser 3.37 (H) 0.44 - 1.00 mg/dL   Calcium 8.0 (L) 8.9 - 10.3 mg/dL   GFR calc non Af Amer 13 (L) >60 mL/min   GFR calc Af Amer 15 (L) >60 mL/min   Anion gap 17 (H) 5 - 15    Comment: Performed at [facility], [place] [address], [place], [place] [address]  CBC     Status: Abnormal   Collection Time: 05/27/19 11:30 AM  Result Value Ref Range   WBC 8.1 4.0 - 10.5 K/uL   RBC 3.73 (L) 3.87 - 5.11 MIL/uL   Hemoglobin 10.9 (L) 12.0 - 15.0 g/dL   HCT 34.6 (L) 36.0 - 46.0 %   MCV 92.8 80.0 - 100.0 fL   MCH 29.2 26.0 - 34.0 pg   MCHC 31.5 30.0 - 36.0 g/dL   RDW 17.1 (H) 11.5 - 15.5 %   Platelets 228 150 - 400 K/uL   nRBC 0.0 0.0 - 0.2 %    Comment: Performed at [facility], [place] [address] [place], [place] [address]   No results found.  Review of Systems  Blood pressure 134/64, pulse 89, temperature 98.1 F (36.7 C), temperature source Oral, resp. rate 10, height '5\' 3"'  (1.6 m), weight 73.5 kg, SpO2 97 %. Physical Exam  Constitutional: She is oriented to person, place, and time. She appears well-developed and well-nourished.  HENT:  Head: Normocephalic.  Eyes: Pupils are equal, round, and reactive to light.  Cardiovascular: Normal rate.  Respiratory: Effort normal.  GI: Soft.  Musculoskeletal:     Left shoulder: Swelling, deformity, tenderness, bony tenderness and pain present. Decreased range of motion. Decreased strength.     Cervical back: Normal range of motion.  Neurological: She is alert and oriented to person, place, and time.  Skin: Skin is warm and dry.  Psychiatric: She has a  normal mood and affect.     Assessment/Plan Left shoulder with subacute anterior dislocation  Our plan is to proceed to surgery today to hopefully, under general anesthesia, perform a closed reduction of her left shoulder.  Obviously if this is too difficult to do we would need to proceed to an open reduction of the dislocation.  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  I had a long and thorough discussion about the rationale behind proceeding with surgery as well as the risk and benefits involved.  The left shoulder is marked and informed consent is obtained.  Mcarthur Rossetti, MD 05/27/2019, 1:06 PM

## 2019-05-27 NOTE — Discharge Instructions (Signed)
You need to stay in your sling at all times (do not remove your sling) Do not attempt to try to reach over head or away or behind with your left arm at all.   General Anesthesia, Adult, Care After This sheet gives you information about how to care for yourself after your procedure. Your health care provider may also give you more specific instructions. If you have problems or questions, contact your health care provider. What can I expect after the procedure? After the procedure, the following side effects are common:  Pain or discomfort at the IV site.  Nausea.  Vomiting.  Sore throat.  Trouble concentrating.  Feeling cold or chills.  Weak or tired.  Sleepiness and fatigue.  Soreness and body aches. These side effects can affect parts of the body that were not involved in surgery. Follow these instructions at home:  For at least 24 hours after the procedure:  Have a responsible adult stay with you. It is important to have someone help care for you until you are awake and alert.  Rest as needed.  Do not: ? Participate in activities in which you could fall or become injured. ? Drive. ? Use heavy machinery. ? Drink alcohol. ? Take sleeping pills or medicines that cause drowsiness. ? Make important decisions or sign legal documents. ? Take care of children on your own. Eating and drinking  Follow any instructions from your health care provider about eating or drinking restrictions.  When you feel hungry, start by eating small amounts of foods that are soft and easy to digest (bland), such as toast. Gradually return to your regular diet.  Drink enough fluid to keep your urine pale yellow.  If you vomit, rehydrate by drinking water, juice, or clear broth. General instructions  If you have sleep apnea, surgery and certain medicines can increase your risk for breathing problems. Follow instructions from your health care provider about wearing your sleep  device: ? Anytime you are sleeping, including during daytime naps. ? While taking prescription pain medicines, sleeping medicines, or medicines that make you drowsy.  Return to your normal activities as told by your health care provider. Ask your health care provider what activities are safe for you.  Take over-the-counter and prescription medicines only as told by your health care provider.  If you smoke, do not smoke without supervision.  Keep all follow-up visits as told by your health care provider. This is important. Contact a health care provider if:  You have nausea or vomiting that does not get better with medicine.  You cannot eat or drink without vomiting.  You have pain that does not get better with medicine.  You are unable to pass urine.  You develop a skin rash.  You have a fever.  You have redness around your IV site that gets worse. Get help right away if:  You have difficulty breathing.  You have chest pain.  You have blood in your urine or stool, or you vomit blood. Summary  After the procedure, it is common to have a sore throat or nausea. It is also common to feel tired.  Have a responsible adult stay with you for the first 24 hours after general anesthesia. It is important to have someone help care for you until you are awake and alert.  When you feel hungry, start by eating small amounts of foods that are soft and easy to digest (bland), such as toast. Gradually return to your regular diet.  Drink  enough fluid to keep your urine pale yellow.  Return to your normal activities as told by your health care provider. Ask your health care provider what activities are safe for you. This information is not intended to replace advice given to you by your health care provider. Make sure you discuss any questions you have with your health care provider. Document Revised: 03/27/2017 Document Reviewed: 11/07/2016 Elsevier Patient Education  [place].

## 2019-05-27 NOTE — Anesthesia Preprocedure Evaluation (Addendum)
Anesthesia Evaluation  Patient identified by MRN, date of birth, ID band Patient awake    Reviewed: Allergy & Precautions, NPO status , Patient's Chart, lab work & pertinent test results  History of Anesthesia Complications (+) PONV and history of anesthetic complications  Airway Mallampati: II  TM Distance: >3 FB Neck ROM: Full    Dental  (+) Teeth Intact, Dental Advisory Given, Caps,    Pulmonary asthma ,    Pulmonary exam normal breath sounds clear to auscultation       Cardiovascular hypertension, Pt. on medications (-) angina+ CAD, + Past MI, + CABG, + Peripheral Vascular Disease and +CHF  Normal cardiovascular exam+ dysrhythmias (LBBB)  Rhythm:Regular Rate:Normal     Neuro/Psych  Headaches, negative psych ROS   GI/Hepatic negative GI ROS, Neg liver ROS,   Endo/Other  diabetes, Type 2, Insulin Dependent  Renal/GU ESRF and DialysisRenal disease     Musculoskeletal  (+) Arthritis , left shoulder dislocation   Abdominal   Peds  Hematology negative hematology ROS (+)   Anesthesia Other Findings Day of surgery medications reviewed with the patient.  Reproductive/Obstetrics                           Anesthesia Physical Anesthesia Plan  ASA: III  Anesthesia Plan: General   Post-op Pain Management:  Regional for Post-op pain   Induction: Intravenous  PONV Risk Score and Plan: 4 or greater and Dexamethasone and Ondansetron  Airway Management Planned: Oral ETT  Additional Equipment:   Intra-op Plan:   Post-operative Plan: Extubation in OR  Informed Consent: I have reviewed the patients History and Physical, chart, labs and discussed the procedure including the risks, benefits and alternatives for the proposed anesthesia with the patient or authorized representative who has indicated his/her understanding and acceptance.     Dental advisory given  Plan Discussed with:  CRNA  Anesthesia Plan Comments:         Anesthesia Quick Evaluation

## 2019-05-28 DIAGNOSIS — Z992 Dependence on renal dialysis: Secondary | ICD-10-CM | POA: Diagnosis not present

## 2019-05-28 DIAGNOSIS — N186 End stage renal disease: Secondary | ICD-10-CM | POA: Diagnosis not present

## 2019-05-28 DIAGNOSIS — D631 Anemia in chronic kidney disease: Secondary | ICD-10-CM | POA: Diagnosis not present

## 2019-05-28 DIAGNOSIS — N2581 Secondary hyperparathyroidism of renal origin: Secondary | ICD-10-CM | POA: Diagnosis not present

## 2019-05-28 DIAGNOSIS — E119 Type 2 diabetes mellitus without complications: Secondary | ICD-10-CM | POA: Diagnosis not present

## 2019-05-28 DIAGNOSIS — D509 Iron deficiency anemia, unspecified: Secondary | ICD-10-CM | POA: Diagnosis not present

## 2019-05-28 NOTE — Op Note (Signed)
NAME: [name], [name] MEDICAL RECORD [id number] ACCOUNT [id number] DATE OF BIRTH:05/27/1945 FACILITY: [facility] LOCATION: [facility] PHYSICIAN:Anikka Marsan [place], MD  OPERATIVE REPORT  DATE OF PROCEDURE:  05/27/2019  PREOPERATIVE DIAGNOSIS:  Subacute left shoulder anterior dislocation.  POSTOPERATIVE DIAGNOSIS:  Subacute left shoulder anterior dislocation.  PROCEDURE:  Closed reduction of left shoulder dislocation under general anesthesia.  SURGEON:  Lind Guest. Ninfa Linden, MD  ANESTHESIA: 1.  General endotracheal 2.  Left upper extremity supraclavicular block.  ESTIMATED BLOOD LOSS:  Not applicable.  COMPLICATIONS:  None.  INDICATIONS:  The patient is a 74 year old female with multiple medical problems who is on dialysis.  She came to my office this Wednesday with left shoulder and arm pain with swelling all the way down to her hand after a mechanical fall a week prior to  that.  She is certainly a tough lady and felt like something was wrong with her arm, so she came to the orthopedic surgeon a week later.  In the office, we recognized under clinical exam and radiographic assessment that she had anterior shoulder  dislocation.  She understands that this is something that needs to be reduced under general anesthesia since it has been out for over a week.  We talked about the risk of fracture and recurrent dislocations.  We talked about the risk of needing to open  the shoulder if needed to get the shoulder reduced.  After a long and thorough discussion of this, we also talked about nerve or vessel injury.  After a thorough discussion about surgery, she did wish to proceed.  Anesthesia obtained a regional block in the holding room.  She was then brought to the operating room and placed supine on the operating table.  General anesthesia was then obtained.  A  time-out was called.  She was identified as correct patient, correct left shoulder.  I then assessed her shoulder under  direct fluoroscopy, and it was definitely an anterior dislocation.  DESCRIPTION OF PROCEDURE:  With gentle traction and countertraction, we were able to reduce the shoulder and verify its reduction clinically and radiographically.  Once I started abducting her shoulder to 90 degrees easily, it easily anterior dislocated  again, and so I had to reduce it again.  We then placed her in a sling and swath and verified the shoulder was then placed again radiographically.  She was awakened, extubated, and taken to recovery room in stable condition.  All final counts were  correct.  There were no complications noted.  Postoperatively, we are going to have her remain in the sling without removing it for the next 2 weeks to try to get the shoulder to stiffen up.  She needs to avoid abduction and external rotation of that  left shoulder in the interim, and this will be communicated to her and her family.  LN/NUANCE  D:05/27/2019 T:05/28/2019 JOB:[id number]

## 2019-05-30 ENCOUNTER — Encounter: Payer: Self-pay | Admitting: *Deleted

## 2019-05-30 DIAGNOSIS — Z4781 Encounter for orthopedic aftercare following surgical amputation: Secondary | ICD-10-CM | POA: Diagnosis not present

## 2019-05-30 DIAGNOSIS — I132 Hypertensive heart and chronic kidney disease with heart failure and with stage 5 chronic kidney disease, or end stage renal disease: Secondary | ICD-10-CM | POA: Diagnosis not present

## 2019-05-30 DIAGNOSIS — I252 Old myocardial infarction: Secondary | ICD-10-CM | POA: Diagnosis not present

## 2019-05-30 DIAGNOSIS — I2510 Atherosclerotic heart disease of native coronary artery without angina pectoris: Secondary | ICD-10-CM | POA: Diagnosis not present

## 2019-05-30 DIAGNOSIS — E1122 Type 2 diabetes mellitus with diabetic chronic kidney disease: Secondary | ICD-10-CM | POA: Diagnosis not present

## 2019-05-30 DIAGNOSIS — I5042 Chronic combined systolic (congestive) and diastolic (congestive) heart failure: Secondary | ICD-10-CM | POA: Diagnosis not present

## 2019-05-31 DIAGNOSIS — N2581 Secondary hyperparathyroidism of renal origin: Secondary | ICD-10-CM | POA: Diagnosis not present

## 2019-05-31 DIAGNOSIS — Z992 Dependence on renal dialysis: Secondary | ICD-10-CM | POA: Diagnosis not present

## 2019-05-31 DIAGNOSIS — D509 Iron deficiency anemia, unspecified: Secondary | ICD-10-CM | POA: Diagnosis not present

## 2019-05-31 DIAGNOSIS — N186 End stage renal disease: Secondary | ICD-10-CM | POA: Diagnosis not present

## 2019-05-31 DIAGNOSIS — E119 Type 2 diabetes mellitus without complications: Secondary | ICD-10-CM | POA: Diagnosis not present

## 2019-05-31 DIAGNOSIS — D631 Anemia in chronic kidney disease: Secondary | ICD-10-CM | POA: Diagnosis not present

## 2019-06-01 ENCOUNTER — Encounter (HOSPITAL_BASED_OUTPATIENT_CLINIC_OR_DEPARTMENT_OTHER): Payer: Medicare Other | Admitting: Physician Assistant

## 2019-06-01 ENCOUNTER — Ambulatory Visit (INDEPENDENT_AMBULATORY_CARE_PROVIDER_SITE_OTHER): Payer: Medicare Other | Admitting: Orthopaedic Surgery

## 2019-06-01 ENCOUNTER — Other Ambulatory Visit: Payer: Self-pay

## 2019-06-01 ENCOUNTER — Encounter: Payer: Self-pay | Admitting: Orthopaedic Surgery

## 2019-06-01 ENCOUNTER — Ambulatory Visit (INDEPENDENT_AMBULATORY_CARE_PROVIDER_SITE_OTHER): Payer: Medicare Other

## 2019-06-01 DIAGNOSIS — E11621 Type 2 diabetes mellitus with foot ulcer: Secondary | ICD-10-CM | POA: Diagnosis not present

## 2019-06-01 DIAGNOSIS — S43015D Anterior dislocation of left humerus, subsequent encounter: Secondary | ICD-10-CM

## 2019-06-01 DIAGNOSIS — I872 Venous insufficiency (chronic) (peripheral): Secondary | ICD-10-CM | POA: Diagnosis not present

## 2019-06-01 DIAGNOSIS — L97212 Non-pressure chronic ulcer of right calf with fat layer exposed: Secondary | ICD-10-CM | POA: Diagnosis not present

## 2019-06-01 DIAGNOSIS — L97512 Non-pressure chronic ulcer of 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d: Secondary | ICD-10-CM | POA: Diagnosis not present

## 2019-06-01 DIAGNOSIS — L97812 Non-pressure chronic ulcer of other part of right lower leg with fat layer exposed: Secondary | ICD-10-CM | POA: Diagnosis not present

## 2019-06-01 DIAGNOSIS — Z8739 Personal history of other diseases of the musculoskeletal system and connective tissue: Secondary | ICD-10-CM

## 2019-06-01 DIAGNOSIS — I890 Lymphedema, not elsewhere classified: Secondary | ICD-10-CM | POA: Diagnosis not present

## 2019-06-01 DIAGNOSIS — Z87828 Personal history of other (healed) physical injury and trauma: Secondary | ICD-10-CM

## 2019-06-01 DIAGNOSIS — L97822 Non-pressure chronic ulcer of other part of left lower leg with fat layer exposed: Secondary | ICD-10-CM | POA: Diagnosis not present

## 2019-06-01 NOTE — Progress Notes (Signed)
The patient is being seen 1 week after we took her to the operating room for a closed reduction of a anterior dislocation has been out for a week.  We have had her in the sling and she has been compliant with being in the sling.  She is 74 years old.  It was a very unstable shoulder.  On exam today she is in her sling.  Clinically the shoulder appears located.  An AP and scapular Y view of the left shoulder shows that it is still located.  I will have her continue the sling for the next week.  I would like to see her back next week for another 2 views of the left shoulder with it being a scapular Y and AP view.

## 2019-06-01 NOTE — Progress Notes (Addendum)
Pamela, [name] ([id number]) Visit Report for 06/01/2019 Chief Complaint Document Details Patient Name: Date of Service: [name], [name] 06/01/2019 3:00 PM Medical Record [id number] Patient Account Number: [id number] Date of Birth/Sex: Treating RN: 1946-03-25 (74 y.o. [name] Primary Care Provider: [name] Other Clinician: Referring Provider: Treating Provider/Extender:Stone III, Lemar Livings, Lendon Ka Weeks in Treatment: 2 Information Obtained from: Patient Chief Complaint Bilateral LE ulcers and Right plantar foot ulcer Electronic Signature(s) Signed: 06/01/2019 2:24:17 PM By: Worthy Keeler PA-C Entered By: Worthy Keeler on 06/01/2019 14:24:16 -------------------------------------------------------------------------------- Debridement Details Patient Name: Date of Service: [name] 06/01/2019 3:00 PM Medical Record [id number] Patient Account Number: [id number] Date of Birth/Sex: Treating RN: [birth year] (74 y.o. [name] Primary Care Provider: [name] Other Clinician: Referring Provider: Treating Provider/Extender:Stone III, Lemar Livings, Lendon Ka Weeks in Treatment: 2 Debridement Performed for Wound #9 Right,Medial,Posterior Lower Leg Assessment: Performed By: Physician Worthy Keeler, PA Debridement Type: Debridement Severity of Tissue Pre Fat layer exposed Debridement: Level of Consciousness (Pre- Awake and Alert procedure): Pre-procedure Verification/Time Out Taken: Yes - 16:53 Start Time: 16:53 Pain Control: Other : benzocaine 20 % spray Total Area Debrided (L x W): 0.6 (cm) x 0.4 (cm) = 0.24 (cm) Tissue and other material Non-Viable, Skin: Epidermis, Fibrin/Exudate debrided: Level: Skin/Epidermis Debridement Description: Selective/Open Wound Instrument: Curette Bleeding: None End Time: 16:57 Procedural Pain: 0 Post Procedural Pain: 0 Response to Treatment: Procedure was tolerated  well Level of Consciousness Awake and Alert (Post-procedure): Post Debridement Measurements of Total Wound Length: (cm) 0.5 Width: (cm) 0.3 Depth: (cm) 0.1 Volume: (cm) 0.012 Character of Wound/Ulcer Post Improved Debridement: Severity of Tissue Post Debridement: Fat layer exposed Post Procedure Diagnosis Same as Pre-procedure Electronic Signature(s) Signed: 06/01/2019 5:55:18 PM By: Worthy Keeler PA-C Signed: 06/01/2019 6:08:21 PM By: Baruch Gouty RN, BSN Entered By: Baruch Gouty on 06/01/2019 16:57:46 -------------------------------------------------------------------------------- HPI Details Patient Name: Date of Service: [name] 06/01/2019 3:00 PM Medical Record [id number] Patient Account Number: [id number] Date of Birth/Sex: Treating RN: March 16, 1946 (74 y.o. [name] Primary Care Provider: [name] Other Clinician: Referring Provider: Treating Provider/Extender:Stone III, Lemar Livings, Lendon Ka Weeks in Treatment: 2 History of Present Illness HPI Description: 02/23/2019 on evaluation today patient presents for initial evaluation in our clinic concerning issues that she has been having with her bilateral lower extremities as well as her plantar foot on the right. This has been quite some time in regard to the foot ulcer even back as far as the beginning of this year that is 2020. She has had the wounds on her legs a much shorter time but that still has been roughly 2-3 months. Nonetheless she is really not shown signs of a lot of improvement. She tells me that she does have a past medical history of diabetes, venous insufficiency, lymphedema which is mild but nonetheless present, peripheral vascular disease and she has previously been recommended to have a femoral-popliteal bypass but put that off at that point they are basically just monitoring at this time. She has end-stage renal disease with dependence on renal dialysis,  hypertension, and obviously wounds of the bilateral lower extremities at this point. She tells me that she is having some discomfort but fortunately nothing too significant at this point which is good news. No fevers, chills, nausea, vomiting, or diarrhea. She is mainly been leaving these areas open to air as much as possible at this point at 1 point she was trying to keep  them covered more but that really did not help either. Fortunately there is no signs of active systemic nor local infection at this time. 03/02/2019 on evaluation today patient appears to be doing well with regard to her wounds. The leg ulcers which were very dry are much more moist at this point and seem to be doing great at this time. Overall I am very pleased with how things seem to be progressing. With regard to her foot ulcer it is showing some signs of slight improvement in my opinion overall there does not appear to be anything worse at least which is also good news. No fevers, chills, nausea, vomiting, or diarrhea. 03/09/2019 on evaluation today patient appears to be doing better with regard to her lower extremity ulcers a lot of the necrotic tissue is loosening up which is good news. In regard to her right plantar foot ulcer this appears to be roughly the same based on what I am seeing currently although there is less callus buildup. Unfortunately she does have an open wound area on the left lateral foot region which unfortunately probes down to bone once this was thoroughly examined. This is something she did not even know was opened she had had a Band-Aid over it just for protection last couple times I saw her. 03/16/19 evaluation today patient appears to be doing Someone poorly in regard to her left lower extremity. This seems to be likely infected based on what Im seeing at this point. Theres no signs of active infection at this time systemically but nonetheless theres a lot more erythema of the foot in general  unfortunately. Her x-rays were reviewed and did not show any evidence of infection of the right foot. On the left there was a A little bit more going on but no obvious evidence of osteomyelitis though it was mentioned that if that was still a concern that number I would be the best way to further evaluate this. I think they were going to need to proceed with MRI to be honest based on what Im seeing today. 03/23/2019 on evaluation today patient actually appears to be doing worse compared to even last week's visit with regard to her wounds especially the left foot but even the right lower extremity. Both are measuring deeper as far as the overall depth of the wound on the left foot this also seems to have spread as far as the open wound location. It also does appear to me that the cellulitis has spread further up her leg which also has me very concerned at this point. There fortunately is no signs of active infection at this point systemically such as evidence of sepsis but nonetheless I am still concerned about the fact that again the patient does not seem to be turning around. We did have her on IV vancomycin through dialysis which they graciously help this with as well. With that being said it appears based on her culture results that I did review today that cefepime or something of the like would likely be a better option the vancomycin is not likely to help with the Serratia species that was identified. 03/28/19 Fem-Pop 04/06/19 Amp toe 74month follow-up with vascular in april 05/18/2019 on evaluation today patient presents for reevaluation here in the clinic after having been sent to the ER by myself December 2020 due to overall worsening of her condition. She did have a femoral-popliteal bypass performed on 03/28/2019. 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 Subsequently she does have a 51-month follow-up with vascular in April  but seems to be doing well from a vascular standpoint at this time. Overall the main issue for which she comes in today she does have a area on the plantar aspect of her right foot where she does have a callus there may or may not be a wound under this region. Subsequently she does have several small ulcerations on the bilateral lower extremities that are more venous stasis in nature. Continue to these do need to be addressed today as well. Fortunately there is no signs of active infection at this time. 05/25/2019 upon evaluation today patient appears to be doing well in regard to her lower extremities. Fortunately there is no signs of active infection at this time. No fever chills noted. She has been tolerating the dressing changes without complication. Fortunately she is doing much better in regard to her plantar foot in fact this appears to be completely healed which is excellent news. 06/01/2019 on evaluation today patient actually appears to be doing very well in regard to her wounds at this point. Fortunately there is no signs of active infection. No fevers, chills, nausea, vomiting, or diarrhea. Electronic Signature(s) Signed: 06/01/2019 5:00:46 PM By: Worthy Keeler PA-C Entered By: Worthy Keeler on 06/01/2019 17:00:45 -------------------------------------------------------------------------------- Physical Exam Details Patient Name: Date of Service: [name], [name] 06/01/2019 3:00 PM Medical Record [id number] Patient Account Number: [id number] Date of Birth/Sex: Treating RN: 04-29-1945 (74 y.o. [name] Primary Care Provider: [name] Other Clinician: Referring Provider: Treating Provider/Extender:Stone III, Lemar Livings, Lendon Ka Weeks in Treatment: 2 Constitutional Well-nourished and well-hydrated in no acute distress. Respiratory normal breathing without difficulty. Psychiatric this patient is able to make decisions and demonstrates good insight  into disease process. Alert and Oriented x 3. pleasant and cooperative. Notes Patient actually appears to be healing quite nicely and in fact only has 2 open wound areas at this point wound on the medial right leg 1 on the anterior right leg. With that being said the medial is barely there I did remove some dry skin over the surface of this and it was minimally open after this but again I think it will be healed by next week on the anterior portion this is still a little bit more open at this point but again is measuring smaller and looking excellent. I think we are very close to complete resolution. Electronic Signature(s) Signed: 06/01/2019 5:01:22 PM By: Worthy Keeler PA-C Entered By: Worthy Keeler on 06/01/2019 17:01:22 -------------------------------------------------------------------------------- Physician Orders Details Patient Name: Date of Service: BEKKA, QIAN 06/01/2019 3:00 PM Medical Record [id number] Patient Account Number: [id number] Date of Birth/Sex: Treating RN: 09-Mar-1946 (74 y.o. [name] Primary Care Provider: [name] Other Clinician: Referring Provider: Treating Provider/Extender:Stone III, Lemar Livings, Lendon Ka Weeks in Treatment: 2 Verbal / Phone Orders: No Diagnosis Coding ICD-10 Coding Code Description I87.2 Venous insufficiency (chronic) (peripheral) I89.0 Lymphedema, not elsewhere classified L97.812 Non-pressure chronic ulcer of other part of right lower leg with fat layer exposed L97.822 Non-pressure chronic ulcer of other part of left lower leg with fat layer exposed E11.621 Type 2 diabetes mellitus with foot ulcer L97.512 Non-pressure chronic ulcer of 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d I73.89 Other specified peripheral vascular diseases N18.6 End stage renal disease Z99.2 Dependence on renal dialysis I10 Essential (primary) hypertension Follow-up Appointments Return Appointment in 1 week. Dressing  Change  Frequency Do not change entire dressing for one week. Skin Barriers/Peri-Wound Care Moisturizing lotion - both legs Wound Cleansing May shower with protection. Primary Wound Dressing Wound #8 Right,Anterior Lower Leg Calcium Alginate with Silver Wound #9 Right,Medial,Posterior Lower Leg Calcium Alginate with Silver Secondary Dressing Wound #8 Right,Anterior Lower Leg Dry Gauze Wound #9 Right,Medial,Posterior Lower Leg Dry Gauze Edema Control Kerlix and Coban - Right Lower Extremity Avoid standing for long periods of time Elevate legs to the level of the heart or above for 30 minutes daily and/or when sitting, a frequency of: Off-Loading Other: - foam donut right plantar callous [facility]. - [facility] - hold [facility] care this week Electronic Signature(s) Signed: 06/01/2019 5:55:18 PM By: Worthy Keeler PA-C Signed: 06/01/2019 6:08:21 PM By: Baruch Gouty RN, BSN Entered By: Baruch Gouty on 06/01/2019 16:59:44 -------------------------------------------------------------------------------- Problem List Details Patient Name: Date of Service: [name] 06/01/2019 3:00 PM Medical Record [id number] Patient Account Number: [id number] Date of Birth/Sex: Treating RN: 1946/03/19 (74 y.o. [name], Linda Primary Care Provider: [name] Other Clinician: Referring Provider: Treating Provider/Extender:Stone III, Lemar Livings, Abelino Derrick in Treatment: 2 Active Problems ICD-10 Evaluated Encounter Code Description Active Date Today Diagnosis I87.2 Venous insufficiency (chronic) (peripheral) 05/18/2019 No Yes I89.0 Lymphedema, not elsewhere classified 05/18/2019 No Yes L97.812 Non-pressure chronic ulcer of other part of right lower 05/18/2019 No Yes leg with fat layer exposed L97.822 Non-pressure chronic ulcer of other part of left lower 05/18/2019 No Yes leg with fat layer  exposed E11.621 Type 2 diabetes mellitus with foot ulcer 05/18/2019 No Yes L97.512 Non-pressure chronic ulcer of other part of right foot 05/18/2019 No Yes with fat layer exposed I73.89 Other specified peripheral vascular diseases 05/18/2019 No Yes N18.6 End stage renal disease 05/18/2019 No Yes Z99.2 Dependence on renal dialysis 05/18/2019 No Yes I10 Essential (primary) hypertension 05/18/2019 No Yes Inactive Problems Resolved Problems Electronic Signature(s) Signed: 06/01/2019 2:24:11 PM By: Worthy Keeler PA-C Entered By: Worthy Keeler on 06/01/2019 14:24:10 -------------------------------------------------------------------------------- Progress Note Details Patient Name: Date of Service: [name] 06/01/2019 3:00 PM Medical Record [id number] Patient Account Number: [id number] Date of Birth/Sex: Treating RN: 1945/10/31 (74 y.o. [name] Primary Care Provider: [name] Other Clinician: Referring Provider: Treating Provider/Extender:Stone III, Lemar Livings, Lendon Ka Weeks in Treatment: 2 Subjective Chief Complaint Information obtained from Patient Bilateral LE ulcers and Right plantar foot ulcer History of Present Illness (HPI) 02/23/2019 on evaluation today patient presents for initial evaluation in our clinic concerning issues that she has been having with her bilateral lower extremities as well as her plantar foot on the right. This has been quite some time in regard to the foot ulcer even back as far as the beginning of this year that is 2020. She has had the wounds on her legs a much shorter time but that still has been roughly 2-3 months. Nonetheless she is really not shown signs of a lot of improvement. She tells me that she does have a past medical history of diabetes, venous insufficiency, lymphedema which is mild but nonetheless present, peripheral vascular disease and she has previously been recommended to have a femoral-popliteal bypass  but put that off at that point they are basically just monitoring at this time. She has end-stage renal disease with dependence on renal dialysis, hypertension, and obviously wounds of the bilateral lower extremities at this point. She tells me that she is having some discomfort but  fortunately nothing too significant at this point which is good news. No fevers, chills, nausea, vomiting, or diarrhea. She is mainly been leaving these areas open to air as much as possible at this point at 1 point she was trying to keep them covered more but that really did not help either. Fortunately there is no signs of active systemic nor local infection at this time. 03/02/2019 on evaluation today patient appears to be doing well with regard to her wounds. The leg ulcers which were very dry are much more moist at this point and seem to be doing great at this time. Overall I am very pleased with how things seem to be progressing. With regard to her foot ulcer it is showing some signs of slight improvement in my opinion overall there does not appear to be anything worse at least which is also good news. No fevers, chills, nausea, vomiting, or diarrhea. 03/09/2019 on evaluation today patient appears to be doing better with regard to her lower extremity ulcers a lot of the necrotic tissue is loosening up which is good news. In regard to her right plantar foot ulcer this appears to be roughly the same based on what I am seeing currently although there is less callus buildup. Unfortunately she does have an open wound area on the left lateral foot region which unfortunately probes down to bone once this was thoroughly examined. This is something she did not even know was opened she had had a Band-Aid over it just for protection last couple times I saw her. 03/16/19 evaluation today patient appears to be doing Someone poorly in regard to her left lower extremity. This seems to be likely infected based on what Ioom seeing  at this point. Thereoos no signs of active infection at this time systemically but nonetheless thereoos a lot more erythema of the foot in general unfortunately. Her x-rays were reviewed and did not show any evidence of infection of the right foot. On the left there was a A little bit more going on but no obvious evidence of osteomyelitis though it was mentioned that if that was still a concern that number I would be the best way to further evaluate this. I think they were going to need to proceed with MRI to be honest based on what Ioom seeing today. 03/23/2019 on evaluation today patient actually appears to be doing worse compared to even last week's visit with regard to her wounds especially the left foot but even the right lower extremity. Both are measuring deeper as far as the overall depth of the wound on the left foot this also seems to have spread as far as the open wound location. It also does appear to me that the cellulitis has spread further up her leg which also has me very concerned at this point. There fortunately is no signs of active infection at this point systemically such as evidence of sepsis but nonetheless I am still concerned about the fact that again the patient does not seem to be turning around. We did have her on IV vancomycin through dialysis which they graciously help this with as well. With that being said it appears based on her culture results that I did review today that cefepime or something of the like would likely be a better option the vancomycin is not likely to help with the Serratia species that was identified. 03/28/19 Fem-Pop 04/06/19 Amp toe 32month follow-up with vascular in april 05/18/2019 on evaluation today patient presents for reevaluation  here in the clinic after having been sent to the ER by myself December 2020 due to overall worsening of her condition. She did have a femoral-popliteal bypass performed on 03/28/2019. Subsequently she also had a  ray amputation of the left fifth toe which was actually performed on 04/06/2019. Subsequently she does have a 69-month follow-up with vascular in April but seems to be doing well from a vascular standpoint at this time. Overall the main issue for which she comes in today she does have a area on the plantar aspect of her right foot where she does have a callus there may or may not be a wound under this region. Subsequently she does have several small ulcerations on the bilateral lower extremities that are more venous stasis in nature. Continue to these do need to be addressed today as well. Fortunately there is no signs of active infection at this time. 05/25/2019 upon evaluation today patient appears to be doing well in regard to her lower extremities. Fortunately there is no signs of active infection at this time. No fever chills noted. She has been tolerating the dressing changes without complication. Fortunately she is doing much better in regard to her plantar foot in fact this appears to be completely healed which is excellent news. 06/01/2019 on evaluation today patient actually appears to be doing very well in regard to her wounds at this point. Fortunately there is no signs of active infection. No fevers, chills, nausea, vomiting, or diarrhea. Objective Constitutional Well-nourished and well-hydrated in no acute distress. Vitals Time Taken: 3:45 PM, Height: 64 in, Weight: 160 lbs, BMI: 27.5, Temperature: 98.6 F, Pulse: 88 bpm, Respiratory Rate: 18 breaths/min, Capillary Blood Glucose: 116 mg/dl. General Notes: patient left shoulder surgery, right arm fistula, and no thigh cuff unable to obtain BP. Respiratory normal breathing without difficulty. Psychiatric this patient is able to make decisions and demonstrates good insight into disease process. Alert and Oriented x 3. pleasant and cooperative. General Notes: Patient actually appears to be healing quite nicely and in fact only has 2  open wound areas at this point wound on the medial right leg 1 on the anterior right leg. With that being said the medial is barely there I did remove some dry skin over the surface of this and it was minimally open after this but again I think it will be healed by next week on the anterior portion this is still a little bit more open at this point but again is measuring smaller and looking excellent. I think we are very close to complete resolution. Integumentary (Hair, Skin) Wound #10 status is Healed - Epithelialized. Original cause of wound was Gradually Appeared. The wound is located on the Left,Medial Lower Leg. The wound measures 0cm length x 0cm width x 0cm depth; 0cm^2 area and 0cm^3 volume. Wound #8 status is Open. Original cause of wound was Gradually Appeared. The wound is located on the Right,Anterior Lower Leg. The wound measures 0.9cm length x 0.5cm width x 0.1cm depth; 0.353cm^2 area and 0.035cm^3 volume. There is Fat Layer (Subcutaneous Tissue) Exposed exposed. There is no tunneling or undermining noted. There is a medium amount of serosanguineous drainage noted. The wound margin is flat and intact. There is large (67-100%) red granulation within the wound bed. There is no necrotic tissue within the wound bed. Wound #9 status is Open. Original cause of wound was Gradually Appeared. The wound is located on the Right,Medial,Posterior Lower Leg. The wound measures 0.1cm length x 0.1cm width x  0.1cm depth; 0.008cm^2 area and 0.001cm^3 volume. There is Fat Layer (Subcutaneous Tissue) Exposed exposed. There is no tunneling or undermining noted. There is a small amount of serosanguineous drainage noted. The wound margin is flat and intact. There is no granulation within the wound bed. There is no necrotic tissue within the wound bed. General Notes: fibrin noted. Assessment Active Problems ICD-10 Venous insufficiency (chronic) (peripheral) Lymphedema, not elsewhere  classified Non-pressure chronic ulcer of other part of right lower leg with fat layer exposed Non-pressure chronic ulcer of other part of left lower leg with fat layer exposed Type 2 diabetes mellitus with foot ulcer Non-pressure chronic ulcer of 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d Other specified peripheral vascular diseases End stage renal disease Dependence on renal dialysis Essential (primary) hypertension Procedures Wound #9 Pre-procedure diagnosis of Wound #9 is a Venous Leg Ulcer located on the Right,Medial,Posterior Lower Leg .Severity of Tissue Pre Debridement is: Fat layer exposed. There was a Selective/Open Wound Skin/Epidermis Debridement with a total area of 0.24 sq cm performed by Worthy Keeler, PA. With the following instrument(s): Curette to remove Non-Viable tissue/material. Material removed includes Skin: Epidermis and Fibrin/Exudate and after achieving pain control using Other (benzocaine 20 % spray). No specimens were taken. A time out was conducted at 16:53, prior to the start of the procedure. There was no bleeding. The procedure was tolerated well with a pain level of 0 throughout and a pain level of 0 following the procedure. Post Debridement Measurements: 0.5cm length x 0.3cm width x 0.1cm depth; 0.012cm^3 volume. Character of Wound/Ulcer Post Debridement is improved. Severity of Tissue Post Debridement is: Fat layer exposed. Post procedure Diagnosis Wound #9: Same as Pre-Procedure Plan Follow-up Appointments: Return Appointment in 1 week. Dressing Change Frequency: Do not change entire dressing for one week. Skin Barriers/Peri-Wound Care: Moisturizing lotion - both legs Wound Cleansing: May shower with protection. Primary Wound Dressing: Wound #8 Right,Anterior Lower Leg: Calcium Alginate with Silver Wound #9 Right,Medial,Posterior Lower Leg: Calcium Alginate with Silver Secondary Dressing: Wound #8 Right,Anterior Lower Leg: Dry  Gauze Wound #9 Right,Medial,Posterior Lower Leg: Dry Gauze Edema Control: Kerlix and Coban - Right Lower Extremity Avoid standing for long periods of time Elevate legs to the level of the heart or above for 30 minutes daily and/or when sitting, a frequency of: Off-Loading: Other: - foam donut right plantar callous Home Health: [facility] - [facility] - [facility] wound care this week 1. My suggestion at this time is good to be that we go ahead and continue with the current wound care measures. I think the alginate is doing very well for her and I would recommend that we continue as such. 2. I am in a suggest as well that we continue with the Curlex with Coban wrap that she is done very well with up to this point. 3. I am also going to suggest that she continue to monitor the left leg although I do not think we need to wrap that at this point. We will see patient back for reevaluation in 1 week here in the clinic. If anything worsens or changes patient will contact our office for additional recommendations. Electronic Signature(s) Signed: 06/01/2019 5:02:16 PM By: Worthy Keeler PA-C Entered By: Worthy Keeler on 06/01/2019 17:02:16 -------------------------------------------------------------------------------- SuperBill Details Patient Name: Date of Service: [name], [name] 06/01/2019 Medical Record [id number] Patient Account Number: [id number] Date of Birth/Sex: Treating RN: [birth year] (74 y.o. F) Baruch Gouty  Primary Care Provider: [name] Other Clinician: Referring Provider: Treating Provider/Extender:Stone III, Lemar Livings, Lendon Ka Weeks in Treatment: 2 Diagnosis Coding ICD-10 Codes Code Description I87.2 Venous insufficiency (chronic) (peripheral) I89.0 Lymphedema, not elsewhere classified L97.812 Non-pressure chronic ulcer of other part of right lower leg with fat layer exposed L97.822 Non-pressure  chronic ulcer of other part of left lower leg with fat layer exposed E11.621 Type 2 diabetes mellitus with foot ulcer L97.512 Non-pressure chronic ulcer of 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d I73.89 Other specified peripheral vascular diseases N18.6 End stage renal disease Z99.2 Dependence on renal dialysis I10 Essential (primary) hypertension Facility Procedures CPT4 Code Description: [id number] - DEBRIDE WOUND 1ST 20 SQ CM OR < ICD-10 Diagnosis Description L97.812 Non-pressure chronic ulcer of other part of right lower leg Modifier: with fat lay Quantity: 1 er exposed Physician Procedures CPT4 Code Description: [id number] - WC PHYS DEBR WO ANESTH 20 SQ CM ICD-10 Diagnosis Description M21.031 Non-pressure chronic ulcer of other part of right lower leg Modifier: with fat lay Quantity: 1 er exposed Electronic Signature(s) Signed: 06/01/2019 5:02:24 PM By: Worthy Keeler PA-C Entered By: Worthy Keeler on 06/01/2019 17:02:23

## 2019-06-02 DIAGNOSIS — N186 End stage renal disease: Secondary | ICD-10-CM | POA: Diagnosis not present

## 2019-06-02 DIAGNOSIS — E119 Type 2 diabetes mellitus without complications: Secondary | ICD-10-CM | POA: Diagnosis not present

## 2019-06-02 DIAGNOSIS — D631 Anemia in chronic kidney disease: Secondary | ICD-10-CM | POA: Diagnosis not present

## 2019-06-02 DIAGNOSIS — Z992 Dependence on renal dialysis: Secondary | ICD-10-CM | POA: Diagnosis not present

## 2019-06-02 DIAGNOSIS — N2581 Secondary hyperparathyroidism of renal origin: Secondary | ICD-10-CM | POA: Diagnosis not present

## 2019-06-02 DIAGNOSIS — D509 Iron deficiency anemia, unspecified: Secondary | ICD-10-CM | POA: Diagnosis not present

## 2019-06-02 NOTE — Progress Notes (Signed)
AVAIAH, [name] ([id number]) Visit Report for 06/01/2019 Arrival Information Details Patient Name: Date of Service: [name], [name] 06/01/2019 3:00 PM Medical Record [id number] Patient Account Number: [id number] Date of Birth/Sex: Treating RN: 1945-12-29 (74 y.o. Helene Shoe, Meta.Reding Primary Care Slayden Mennenga: [name] Other Clinician: Referring Xiana Carns: Treating Davone Shinault/Extender:Stone III, Lemar Livings, Abelino Derrick in Treatment: 2 Visit Information History Since Last Visit Added or deleted any medications: No Patient Arrived: Wheel Chair Any new allergies or adverse reactions: No Arrival Time: 15:44 Had a fall or experienced change in No Accompanied By: self activities of daily living that may affect Transfer Assistance: None risk of falls: Patient Identification Verified: Yes Signs or symptoms of abuse/neglect since last No Secondary Verification Process Yes visito Completed: Hospitalized since last visit: No Patient Requires Transmission- No Implantable device outside of the clinic excluding No Based Precautions: cellular tissue based products placed in the center Patient Has Alerts: Yes since last visit: Patient Alerts: R ABI non Has Dressing in Place as Prescribed: Yes compressible Pain Present Now: No L ABI non compressible Electronic Signature(s) Signed: 06/02/2019 6:07:18 PM By: Deon Pilling Entered By: Deon Pilling on 06/01/2019 15:47:19 -------------------------------------------------------------------------------- Clinic Level of Care Assessment Details Patient Name: Date of Service: [name], [name] 06/01/2019 3:00 PM Medical Record [id number] Patient Account Number: [id number] Date of Birth/Sex: Treating RN: [birth year] (74 y.o. [name] Primary Care Durwood Dittus: [name] Other Clinician: Referring Hudsyn Barich: Treating Tamina Cyphers/Extender:Stone III, Lemar Livings, Abelino Derrick in Treatment: 2 Clinic Level of  Care Assessment Items TOOL 4 Quantity Score []  - Use when only an EandM is performed on FOLLOW-UP visit 0 ASSESSMENTS - Nursing Assessment / Reassessment X - Reassessment of Co-morbidities (includes updates in patient status) 1 10 X - Reassessment of Adherence to Treatment Plan 1 5 ASSESSMENTS - Wound and Skin Assessment / Reassessment []  - Simple Wound Assessment / Reassessment - one wound 0 X - Complex Wound Assessment / Reassessment - multiple wounds 3 5 []  - Dermatologic / Skin Assessment (not related to wound area) 0 ASSESSMENTS - Focused Assessment X - Circumferential Edema Measurements - multi extremities 2 5 []  - Nutritional Assessment / Counseling / Intervention 0 X - Lower Extremity Assessment (monofilament, tuning fork, pulses) 1 5 []  - Peripheral Arterial Disease Assessment (using hand held doppler) 0 ASSESSMENTS - Ostomy and/or Continence Assessment and Care []  - Incontinence Assessment and Management 0 []  - Ostomy Care Assessment and Management (repouching, etc.) 0 PROCESS - Coordination of Care X - Simple Patient / Family Education for ongoing care 1 15 []  - Complex (extensive) Patient / Family Education for ongoing care 0 X - Staff obtains Programmer, systems, Records, Test Results / Process Orders 1 10 X - Staff telephones HHA, Nursing Homes / Clarify orders / etc 1 10 []  - Routine Transfer to another Facility (non-emergent condition) 0 []  - Routine Hospital Admission (non-emergent condition) 0 []  - New Admissions / Biomedical engineer / Ordering NPWT, Apligraf, etc. 0 []  - Emergency Hospital Admission (emergent condition) 0 X - Simple Discharge Coordination 1 10 []  - Complex (extensive) Discharge Coordination 0 PROCESS - Special Needs []  - Pediatric / Minor Patient Management 0 []  - Isolation Patient Management 0 []  - Hearing / Language / Visual special needs 0 []  - Assessment of Community assistance (transportation, D/C planning, etc.) 0 []  - Additional assistance /  Altered mentation 0 []  - Support Surface(s) Assessment (bed, cushion, seat, etc.) 0 INTERVENTIONS - Wound Cleansing / Measurement []  - Simple Wound Cleansing -  one wound 0 X - Complex Wound Cleansing - multiple wounds 3 5 X - Wound Imaging (photographs - any number of wounds) 1 5 []  - Wound Tracing (instead of photographs) 0 []  - Simple Wound Measurement - one wound 0 X - Complex Wound Measurement - multiple wounds 3 5 INTERVENTIONS - Wound Dressings []  - Small Wound Dressing one or multiple wounds 0 X - Medium Wound Dressing one or multiple wounds 1 15 []  - Large Wound Dressing one or multiple wounds 0 X - Application of Medications - topical 1 5 []  - Application of Medications - injection 0 INTERVENTIONS - Miscellaneous []  - External ear exam 0 []  - Specimen Collection (cultures, biopsies, blood, body fluids, etc.) 0 []  - Specimen(s) / Culture(s) sent or taken to Lab for analysis 0 []  - Patient Transfer (multiple staff / Civil Service fast streamer / Similar devices) 0 []  - Simple Staple / Suture removal (25 or less) 0 []  - Complex Staple / Suture removal (26 or more) 0 []  - Hypo / Hyperglycemic Management (close monitor of Blood Glucose) 0 []  - Ankle / Brachial Index (ABI) - do not check if billed separately 0 X - Vital Signs 1 5 Has the patient been seen at the hospital within the last three years: Yes Total Score: 150 Level Of Care: New/Established - Level 4 Electronic Signature(s) Signed: 06/01/2019 6:08:21 PM By: Baruch Gouty RN, BSN Entered By: Baruch Gouty on 06/01/2019 16:54:19 -------------------------------------------------------------------------------- Lower Extremity Assessment Details Patient Name: Date of Service: [name] 06/01/2019 3:00 PM Medical Record [id number] Patient Account Number: [id number] Date of Birth/Sex: Treating RN: Jul 29, 1945 (74 y.o. Helene Shoe, Meta.Reding Primary Care Marche Hottenstein: [name] Other Clinician: Referring Jnya Brossard: Treating  Hjalmer Iovino/Extender:Stone III, Lemar Livings, Lendon Ka Weeks in Treatment: 2 Edema Assessment Assessed: [Left: Yes] [Right: Yes] Edema: [Left: Yes] [Right: Yes] Calf Left: Right: Point of Measurement: 31 cm From Medial Instep 30.5 cm 28.5 cm Ankle Left: Right: Point of Measurement: 11 cm From Medial Instep 19 cm 17.5 cm Electronic Signature(s) Signed: 06/02/2019 6:07:18 PM By: Deon Pilling Entered By: Deon Pilling on 06/01/2019 15:56:16 -------------------------------------------------------------------------------- [place] Details Patient Name: Date of Service: [name] 06/01/2019 3:00 PM Medical Record [id number] Patient Account Number: [id number] Date of Birth/Sex: Treating RN: [birth year] (74 y.o. [name] Primary Care Lateefah Mallery: [name] Other Clinician: Referring Phong Isenberg: Treating Akesha Uresti/Extender:Stone III, Lemar Livings, Abelino Derrick in Treatment: 2 Active Inactive Nutrition Nursing Diagnoses: Impaired glucose control: actual or potential Potential for alteratiion in Nutrition/Potential for imbalanced nutrition Goals: Patient/caregiver will maintain therapeutic glucose control Date Initiated: 05/18/2019 Target Resolution Date: 06/15/2019 Goal Status: Active Interventions: Assess HgA1c results as ordered upon admission and as needed Assess patient nutrition upon admission and as needed per policy Treatment Activities: Patient referred to Primary Care Physician for further nutritional evaluation : 05/18/2019 Notes: Wound/Skin Impairment Nursing Diagnoses: Impaired tissue integrity Knowledge deficit related to ulceration/compromised skin integrity Goals: Patient/caregiver will verbalize understanding of skin care regimen Date Initiated: 05/18/2019 Target Resolution Date: 06/15/2019 Goal Status: Active Ulcer/skin breakdown will have a volume reduction of 30% by week 4 Date Initiated: 05/18/2019 Target  Resolution Date: 06/15/2019 Goal Status: Active Interventions: Assess patient/caregiver ability to obtain necessary supplies Assess patient/caregiver ability to perform ulcer/skin care regimen upon admission and as needed Assess ulceration(s) every visit Provide education on ulcer and skin care Treatment Activities: Skin care regimen initiated : 05/18/2019 Topical wound management initiated : 05/18/2019 Notes: Electronic Signature(s) Signed: 06/01/2019 6:08:21 PM By: Baruch Gouty  RN, BSN Entered By: Baruch Gouty on 06/01/2019 16:51:50 -------------------------------------------------------------------------------- Pain Assessment Details Patient Name: Date of Service: ANIS, [name] 06/01/2019 3:00 PM Medical Record [id number] Patient Account Number: [id number] Date of Birth/Sex: Treating RN: 07/17/1945 (74 y.o. Helene Shoe, Meta.Reding Primary Care Bradee Common: [name] Other Clinician: Referring Dru Laurel: Treating Aaric Dolph/Extender:Stone III, Lemar Livings, Lendon Ka Weeks in Treatment: 2 Active Problems Location of Pain Severity and Description of Pain Patient Has Paino No Site Locations Pain Management and Medication Current Pain Management: Medication: No Cold Application: No Rest: No Massage: No Activity: No T.E.N.S.: No Heat Application: No Leg drop or elevation: No Is the Current Pain Management Adequate: Adequate How does your wound impact your activities of daily livingo Sleep: No Bathing: No Appetite: No Relationship With Others: No Bladder Continence: No Emotions: No Bowel Continence: No Work: No Toileting: No Drive: No Dressing: No Hobbies: No Electronic Signature(s) Signed: 06/02/2019 6:07:18 PM By: Deon Pilling Entered By: Deon Pilling on 06/01/2019 15:52:22 -------------------------------------------------------------------------------- Patient/Caregiver Education Details Patient Name: Date of Service: Doan, [name] J.  2/24/2021andnbsp3:00 PM Medical Record [id number] Patient Account Number: [id number] Date of Birth/Gender: Treating RN: 1945/12/05 (73 y.o. [name] Primary Care Physician: [name] Other Clinician: Referring Physician: Treating Physician/Extender:Stone III, Lemar Livings, Abelino Derrick in Treatment: 2 Education Assessment Education Provided To: Patient Education Topics Provided Venous: Methods: Explain/Verbal Responses: Reinforcements needed, State content correctly Wound/Skin Impairment: Methods: Explain/Verbal Responses: Reinforcements needed, State content correctly Electronic Signature(s) Signed: 06/01/2019 6:08:21 PM By: Baruch Gouty RN, BSN Entered By: Baruch Gouty on 06/01/2019 16:52:58 -------------------------------------------------------------------------------- Wound Assessment Details Patient Name: Date of Service: [name] 06/01/2019 3:00 PM Medical Record [id number] Patient Account Number: [id number] Date of Birth/Sex: Treating RN: June 05, 1945 (74 y.o. Helene Shoe, Meta.Reding Primary Care Miroslav Gin: [name] Other Clinician: Referring Trevonn Hallum: Treating Hillary Schwegler/Extender:Stone III, Lemar Livings, Lendon Ka Weeks in Treatment: 2 Wound Status Wound Number: 10 Primary Venous Leg Ulcer Etiology: Wound Location: Left Lower Leg - Medial Wound Healed - Epithelialized Wounding Event: Gradually Appeared Status: Date Acquired: 05/18/2019 Comorbid Cataracts, Anemia, Asthma, Congestive Heart Weeks Of Treatment: 2 History: Failure, Coronary Artery Disease, Clustered Wound: Yes Hypertension, Peripheral Arterial Disease, Peripheral Venous Disease, Type II Diabetes, End Stage Renal Disease, Osteoarthritis, Osteomyelitis, Neuropathy, Received Radiation Photos Wound Measurements Length: (cm) 0 % Reduction Width: (cm) 0 % Reduction Depth: (cm) 0 Epitheliali Clustered Quantity: 1 Area: (cm) 0 Volume: (cm) 0 Wound  Description Classification: Partial Thickness Foul Odor Wound Margin: Flat and Intact Slough/Fib Exudate Amount: Small Exudate Type: Serosanguineous Exudate Color: red, brown Wound Bed Granulation Amount: Large (67-100%) Granulation Quality: Pink Fascia Expo Necrotic Amount: None Present (0%) Fat Layer ( Tendon Expo Muscle Expo Joint Expos Bone Expose After Cleansing: No rino No Exposed Structure sed: No Subcutaneous Tissue) Exposed: No sed: No sed: No ed: No d: No in Area: 100% in Volume: 100% zation: None Electronic Signature(s) Signed: 06/02/2019 5:25:06 PM By: Mikeal Hawthorne EMT/HBOT Signed: 06/02/2019 6:07:18 PM By: Deon Pilling Entered By: Mikeal Hawthorne on 06/02/2019 14:27:19 -------------------------------------------------------------------------------- Wound Assessment Details Patient Name: Date of Service: [name] 06/01/2019 3:00 PM Medical Record [id number] Patient Account Number: [id number] Date of Birth/Sex: Treating RN: October 17, 1945 (74 y.o. [name] Primary Care Korie Brabson: [name] Other Clinician: Referring Jennell Janosik: Treating Carsyn Boster/Extender:Stone III, Lemar Livings, Lendon Ka Weeks in Treatment: 2 Wound Status Wound Number: 8 Primary Venous Leg Ulcer Etiology: Wound Location: Right Lower Leg - Anterior Wound Open Wounding Event: Gradually Appeared Status: Date Acquired: 12/07/2018 Comorbid  Cataracts, Anemia, Asthma, Congestive Heart Weeks Of Treatment: 2 History: Failure, Coronary Artery Disease, Clustered Wound: No Hypertension, Peripheral Arterial Disease, Peripheral Venous Disease, Type II Diabetes, End Stage Renal Disease, Osteoarthritis, Osteomyelitis, Neuropathy, Received Radiation Photos Wound Measurements Length: (cm) 0.9 % Reduct Width: (cm) 0.5 % Reduct Depth: (cm) 0.1 Epitheli Area: (cm) 0.353 Tunneli Volume: (cm) 0.035 Undermi Wound Description Full Thickness Without Exposed Support Foul  Od Classification: Structures Slough/ Wound Flat and Intact Margin: Exudate Medium Amount: Exudate Serosanguineous Type: Exudate red, brown Color: Wound Bed Granulation Amount: Large (67-100%) Granulation Quality: Red Fascia E Necrotic Amount: None Present (0%) Fat Laye Tendon E Muscle E Joint Ex Bone Exp or After Cleansing: No Fibrino No Exposed Structure xposed: No r (Subcutaneous Tissue) Exposed: Yes xposed: No xposed: No posed: No osed: No ion in Area: 58.4% ion in Volume: 58.8% alization: Large (67-100%) ng: No ning: No Treatment Notes Wound #8 (Right, Anterior Lower Leg) 1. Cleanse With Soap and water 2. Periwound Care Moisturizing lotion 3. Primary Dressing Applied Calcium Alginate Ag 4. Secondary Dressing Dry Gauze 6. Support Layer Holiday representative) Signed: 06/02/2019 5:25:06 PM By: Mikeal Hawthorne EMT/HBOT Signed: 06/02/2019 5:28:31 PM By: Baruch Gouty RN, BSN Entered By: Mikeal Hawthorne on 06/02/2019 14:26:26 -------------------------------------------------------------------------------- Wound Assessment Details Patient Name: Date of Service: [name] 06/01/2019 3:00 PM Medical Record [id number] Patient Account Number: [id number] Date of Birth/Sex: Treating RN: 04/28/1945 (74 y.o. [name], Linda Primary Care Saraia Platner: [name] Other Clinician: Referring Sierra Spargo: Treating Casanova Schurman/Extender:Stone III, Lemar Livings, Lendon Ka Weeks in Treatment: 2 Wound Status Wound Number: 9 Primary Venous Leg Ulcer Etiology: Wound Location: Right Lower Leg - Medial, Posterior Wound Open Status: Wounding Event: Gradually Appeared Comorbid Cataracts, Anemia, Asthma, Congestive Heart Date Acquired: 12/07/2018 History: Failure, Coronary Artery Disease, Weeks Of Treatment: 2 Hypertension, Peripheral Arterial Disease, Clustered Wound: No Peripheral Venous Disease, Type II Diabetes, End Stage Renal  Disease, Osteoarthritis, Osteomyelitis, Neuropathy, Received Radiation Photos Wound Measurements Length: (cm) 0.1 % Reduct Width: (cm) 0.1 % Reduct Depth: (cm) 0.1 Epitheli Area: (cm) 0.008 Tunneli Volume: (cm) 0.001 Undermi Wound Description Classification: Full Thickness Without Exposed Support Foul Od Structures Slough/ Wound Flat and Intact Margin: Exudate Small Amount: Exudate Serosanguineous Type: Exudate red, brown Color: Wound Bed Granulation Amount: None Present (0%) Necrotic Amount: None Present (0%) Fascia E Fat Ten Mus Joi Bon or After Cleansing: No Fibrino Yes Exposed Structure xposed: No Layer (Subcutaneous Tissue) Exposed: Yes don Exposed: No cle Exposed: No nt Exposed: No e Exposed: No ion in Area: 97.5% ion in Volume: 96.8% alization: Large (67-100%) ng: No ning: No Assessment Notes fibrin noted. Treatment Notes Wound #9 (Right, Medial, Posterior Lower Leg) 1. Cleanse With Soap and water 2. Periwound Care Moisturizing lotion 3. Primary Dressing Applied Calcium Alginate Ag 4. Secondary Dressing Dry Gauze 6. Support Layer Holiday representative) Signed: 06/02/2019 5:25:06 PM By: Mikeal Hawthorne EMT/HBOT Signed: 06/02/2019 5:28:31 PM By: Baruch Gouty RN, BSN Entered By: Mikeal Hawthorne on 06/02/2019 14:26:51 -------------------------------------------------------------------------------- [place] Details Patient Name: Date of Service: [name] 06/01/2019 3:00 PM Medical Record [id number] Patient Account Number: [id number] Date of Birth/Sex: Treating RN: 1945/05/05 (74 y.o. Helene Shoe, Meta.Reding Primary Care Cottrell Gentles: [name] Other Clinician: Referring Talibah Colasurdo: Treating Delvonte Berenson/Extender:Stone III, Lemar Livings, Lendon Ka Weeks in Treatment: 2 Vital Signs Time Taken: 15:45 Temperature (F): 98.6 Height (in): 64 Pulse (bpm): 88 Weight (lbs): 160 Respiratory Rate (breaths/min): 18 Body  Mass Index (BMI): 27.5 Capillary Blood Glucose (mg/dl): 116  Reference Range: 80 - 120 mg / dl Notes patient left shoulder surgery, right arm fistula, and no thigh cuff unable to obtain BP. Electronic Signature(s) Signed: 06/02/2019 6:07:18 PM By: Deon Pilling Entered By: Deon Pilling on 06/01/2019 15:52:09

## 2019-06-04 DIAGNOSIS — Z992 Dependence on renal dialysis: Secondary | ICD-10-CM | POA: Diagnosis not present

## 2019-06-04 DIAGNOSIS — N186 End stage renal disease: Secondary | ICD-10-CM | POA: Diagnosis not present

## 2019-06-04 DIAGNOSIS — N2581 Secondary hyperparathyroidism of renal origin: Secondary | ICD-10-CM | POA: Diagnosis not present

## 2019-06-04 DIAGNOSIS — E119 Type 2 diabetes mellitus without complications: Secondary | ICD-10-CM | POA: Diagnosis not present

## 2019-06-04 DIAGNOSIS — D631 Anemia in chronic kidney disease: Secondary | ICD-10-CM | POA: Diagnosis not present

## 2019-06-04 DIAGNOSIS — D509 Iron deficiency anemia, unspecified: Secondary | ICD-10-CM | POA: Diagnosis not present

## 2019-06-06 DIAGNOSIS — E1122 Type 2 diabetes mellitus with diabetic chronic kidney disease: Secondary | ICD-10-CM | POA: Diagnosis not present

## 2019-06-06 DIAGNOSIS — N186 End stage renal disease: Secondary | ICD-10-CM | POA: Diagnosis not present

## 2019-06-06 DIAGNOSIS — Z992 Dependence on renal dialysis: Secondary | ICD-10-CM | POA: Diagnosis not present

## 2019-06-07 DIAGNOSIS — D509 Iron deficiency anemia, unspecified: Secondary | ICD-10-CM | POA: Diagnosis not present

## 2019-06-07 DIAGNOSIS — N186 End stage renal disease: Secondary | ICD-10-CM | POA: Diagnosis not present

## 2019-06-07 DIAGNOSIS — D631 Anemia in chronic kidney disease: Secondary | ICD-10-CM | POA: Diagnosis not present

## 2019-06-07 DIAGNOSIS — N2581 Secondary hyperparathyroidism of renal origin: Secondary | ICD-10-CM | POA: Diagnosis not present

## 2019-06-07 DIAGNOSIS — Z992 Dependence on renal dialysis: Secondary | ICD-10-CM | POA: Diagnosis not present

## 2019-06-08 ENCOUNTER — Encounter (HOSPITAL_BASED_OUTPATIENT_CLINIC_OR_DEPARTMENT_OTHER): Payer: Medicare Other | Attending: Physician Assistant | Admitting: Physician Assistant

## 2019-06-08 ENCOUNTER — Ambulatory Visit (INDEPENDENT_AMBULATORY_CARE_PROVIDER_SITE_OTHER): Payer: Medicare Other

## 2019-06-08 ENCOUNTER — Encounter: Payer: Self-pay | Admitting: Orthopaedic Surgery

## 2019-06-08 ENCOUNTER — Other Ambulatory Visit: Payer: Self-pay

## 2019-06-08 ENCOUNTER — Ambulatory Visit (INDEPENDENT_AMBULATORY_CARE_PROVIDER_SITE_OTHER): Payer: Medicare Other | Admitting: Orthopaedic Surgery

## 2019-06-08 DIAGNOSIS — I120 Hypertensive chronic kidney disease with stage 5 chronic kidney disease or end stage renal disease: Secondary | ICD-10-CM | POA: Diagnosis not present

## 2019-06-08 DIAGNOSIS — L97822 Non-pressure chronic ulcer of other part of left lower leg with fat layer exposed: Secondary | ICD-10-CM | POA: Diagnosis not present

## 2019-06-08 DIAGNOSIS — E11622 Type 2 diabetes mellitus with other skin ulcer: Secondary | ICD-10-CM | POA: Insufficient documentation

## 2019-06-08 DIAGNOSIS — Z923 Personal history of irradiation: Secondary | ICD-10-CM | POA: Insufficient documentation

## 2019-06-08 DIAGNOSIS — Z95820 Peripheral vascular angioplasty status with implants and grafts: Secondary | ICD-10-CM | POA: Diagnosis not present

## 2019-06-08 DIAGNOSIS — L97819 Non-pressure chronic ulcer of other part of right lower leg with unspecified severity: Secondary | ICD-10-CM | POA: Diagnosis not present

## 2019-06-08 DIAGNOSIS — Z87828 Personal history of other (healed) physical injury and trauma: Secondary | ICD-10-CM

## 2019-06-08 DIAGNOSIS — Z8739 Personal history of other diseases of the musculoskeletal system and connective tissue: Secondary | ICD-10-CM | POA: Diagnosis not present

## 2019-06-08 DIAGNOSIS — N186 End stage renal disease: Secondary | ICD-10-CM | POA: Insufficient documentation

## 2019-06-08 DIAGNOSIS — I890 Lymphedema, not elsewhere classified: Secondary | ICD-10-CM | POA: Diagnosis not present

## 2019-06-08 DIAGNOSIS — I25810 Atherosclerosis of coronary artery bypass graft(s) without angina pectoris: Secondary | ICD-10-CM

## 2019-06-08 DIAGNOSIS — L97222 Non-pressure chronic ulcer of left calf with fat layer exposed: Secondary | ICD-10-CM | POA: Diagnosis not present

## 2019-06-08 DIAGNOSIS — Z89422 Acquired absence of other left toe(s): Secondary | ICD-10-CM | POA: Insufficient documentation

## 2019-06-08 DIAGNOSIS — L97219 Non-pressure chronic ulcer of right calf with unspecified severity: Secondary | ICD-10-CM | POA: Diagnosis not present

## 2019-06-08 DIAGNOSIS — E1122 Type 2 diabetes mellitus with diabetic chronic kidney disease: Secondary | ICD-10-CM | POA: Insufficient documentation

## 2019-06-08 NOTE — Progress Notes (Addendum)
[name], [name] ([id number]) Visit Report for 06/08/2019 Chief Complaint Document Details Patient Name: Date of Service: [name], [name] 06/08/2019 1:45 PM Medical Record [id number] Patient Account Number: [id number] Date of Birth/Sex: Treating RN: 05-10-1945 (74 y.o. [name] Primary Care Provider: [name] Other Clinician: Referring Provider: Treating Provider/Extender:Stone III, Lemar Livings, Lendon Ka Weeks in Treatment: 3 Information Obtained from: Patient Chief Complaint Bilateral LE ulcers and Right plantar foot ulcer Electronic Signature(s) Signed: 06/08/2019 2:25:40 PM By: Worthy Keeler PA-C Entered By: Worthy Keeler on 06/08/2019 14:25:40 -------------------------------------------------------------------------------- HPI Details Patient Name: Date of Service: [name] 06/08/2019 1:45 PM Medical Record [id number] Patient Account Number: [id number] Date of Birth/Sex: Treating RN: Aug 30, 1945 (74 y.o. [name] Primary Care Provider: [name] Other Clinician: Referring Provider: Treating Provider/Extender:Stone III, Lemar Livings, Lendon Ka Weeks in Treatment: 3 History of Present Illness HPI Description: 02/23/2019 on evaluation today patient presents for initial evaluation in our clinic concerning issues that she has been having with her bilateral lower extremities as well as her plantar foot on the right. This has been quite some time in regard to the foot ulcer even back as far as the beginning of this year that is 2020. She has had the wounds on her legs a much shorter time but that still has been roughly 2-3 months. Nonetheless she is really not shown signs of a lot of improvement. She tells me that she does have a past medical history of diabetes, venous insufficiency, lymphedema which is mild but nonetheless present, peripheral vascular disease and she has previously been recommended to have a  femoral-popliteal bypass but put that off at that point they are basically just monitoring at this time. She has end-stage renal disease with dependence on renal dialysis, hypertension, and obviously wounds of the bilateral lower extremities at this point. She tells me that she is having some discomfort but fortunately nothing too significant at this point which is good news. No fevers, chills, nausea, vomiting, or diarrhea. She is mainly been leaving these areas open to air as much as possible at this point at 1 point she was trying to keep them covered more but that really did not help either. Fortunately there is no signs of active systemic nor local infection at this time. 03/02/2019 on evaluation today patient appears to be doing well with regard to her wounds. The leg ulcers which were very dry are much more moist at this point and seem to be doing great at this time. Overall I am very pleased with how things seem to be progressing. With regard to her foot ulcer it is showing some signs of slight improvement in my opinion overall there does not appear to be anything worse at least which is also good news. No fevers, chills, nausea, vomiting, or diarrhea. 03/09/2019 on evaluation today patient appears to be doing better with regard to her lower extremity ulcers a lot of the necrotic tissue is loosening up which is good news. In regard to her right plantar foot ulcer this appears to be roughly the same based on what I am seeing currently although there is less callus buildup. Unfortunately she does have an open wound area on the left lateral foot region which unfortunately probes down to bone once this was thoroughly examined. This is something she did not even know was opened she had had a Band-Aid over it just for protection last couple times I saw her. 03/16/19 evaluation today patient appears to  be doing Someone poorly in regard to her left lower extremity. This seems to be likely infected  based on what Im seeing at this point. Theres no signs of active infection at this time systemically but nonetheless theres a lot more erythema of the foot in general unfortunately. Her x-rays were reviewed and did not show any evidence of infection of the right foot. On the left there was a A little bit more going on but no obvious evidence of osteomyelitis though it was mentioned that if that was still a concern that number I would be the best way to further evaluate this. I think they were going to need to proceed with MRI to be honest based on what Im seeing today. 03/23/2019 on evaluation today patient actually appears to be doing worse compared to even last week's visit with regard to her wounds especially the left foot but even the right lower extremity. Both are measuring deeper as far as the overall depth of the wound on the left foot this also seems to have spread as far as the open wound location. It also does appear to me that the cellulitis has spread further up her leg which also has me very concerned at this point. There fortunately is no signs of active infection at this point systemically such as evidence of sepsis but nonetheless I am still concerned about the fact that again the patient does not seem to be turning around. We did have her on IV vancomycin through dialysis which they graciously help this with as well. With that being said it appears based on her culture results that I did review today that cefepime or something of the like would likely be a better option the vancomycin is not likely to help with the Serratia species that was identified. 03/28/19 Fem-Pop 04/06/19 Amp toe 35month follow-up with vascular in april 05/18/2019 on evaluation today patient presents for reevaluation here in the clinic after having been sent to the ER by myself December 2020 due to overall worsening of her condition. She did have a femoral-popliteal bypass performed on 03/28/2019. 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 Subsequently she does have a 44-month follow-up with vascular in April but seems to be doing well from a vascular standpoint at this time. Overall the main issue for which she comes in today she does have a area on the plantar aspect of her right foot where she does have a callus there may or may not be a wound under this region. Subsequently she does have several small ulcerations on the bilateral lower extremities that are more venous stasis in nature. Continue to these do need to be addressed today as well. Fortunately there is no signs of active infection at this time. 05/25/2019 upon evaluation today patient appears to be doing well in regard to her lower extremities. Fortunately there is no signs of active infection at this time. No fever chills noted. She has been tolerating the dressing changes without complication. Fortunately she is doing much better in regard to her plantar foot in fact this appears to be completely healed which is excellent news. 06/01/2019 on evaluation today patient actually appears to be doing very well in regard to her wounds at this point. Fortunately there is no signs of active infection. No fevers, chills, nausea, vomiting, or diarrhea. 06/08/2019 upon evaluation today patient actually appears to be doing great in regard to her right lower extremity  there are no open wounds remaining and she has done extremely well. Unfortunately she has several blistered areas that opened up on the left lower extremity in the interim between last week and this week. She has not been wearing any compression she states that I never told her anything about wearing compression. With that being said this is something that we have discussed in the past and it is something that we wanted her to do and again I did remind her of that today as well. Electronic Signature(s) Signed: 06/08/2019 3:31:42  PM By: Worthy Keeler PA-C Previous Signature: 06/08/2019 2:53:38 PM Version By: Worthy Keeler PA-C Previous Signature: 06/08/2019 2:53:38 PM Version By: Worthy Keeler PA-C Previous Signature: 06/08/2019 2:51:21 PM Version By: Worthy Keeler PA-C Entered By: Worthy Keeler on 06/08/2019 15:31:41 -------------------------------------------------------------------------------- Physical Exam Details Patient Name: Date of Service: [name] 06/08/2019 1:45 PM Medical Record [id number] Patient Account Number: [id number] Date of Birth/Sex: Treating RN: 24-Nov-1945 (74 y.o. [name] Primary Care Provider: [name] Other Clinician: Referring Provider: Treating Provider/Extender:Stone III, Lemar Livings, Lendon Ka Weeks in Treatment: 3 Constitutional Well-nourished and well-hydrated in no acute distress. Respiratory normal breathing without difficulty. Psychiatric this patient is able to make decisions and demonstrates good insight into disease process. Alert and Oriented x 3. pleasant and cooperative. Notes Has several blisters noted on the left lower extremity unfortunately which do not seem to be doing nearly as well at this point. Fortunately there is no signs of active infection at this time. No fever chills noted. With that being said she does have swelling and erythema noted unfortunately which I think is secondary to the fact she has not been wearing any compression over the past week. Electronic Signature(s) Signed: 06/08/2019 3:32:14 PM By: Worthy Keeler PA-C Previous Signature: 06/08/2019 2:53:59 PM Version By: Worthy Keeler PA-C Previous Signature: 06/08/2019 2:52:14 PM Version By: Worthy Keeler PA-C Entered By: Worthy Keeler on 06/08/2019 15:32:14 -------------------------------------------------------------------------------- Physician Orders Details Patient Name: Date of Service: [name] 06/08/2019 1:45 PM Medical Record  [id number] Patient Account Number: [id number] Date of Birth/Sex: Treating RN: 11-Jan-1946 (74 y.o. [name] Primary Care Provider: [name] Other Clinician: Referring Provider: Treating Provider/Extender:Stone III, Lemar Livings, Lendon Ka Weeks in Treatment: 3 Verbal / Phone Orders: No Diagnosis Coding ICD-10 Coding Code Description I87.2 Venous insufficiency (chronic) (peripheral) I89.0 Lymphedema, not elsewhere classified L97.812 Non-pressure chronic ulcer of other part of right lower leg with fat layer exposed L97.822 Non-pressure chronic ulcer of other part of left lower leg with fat layer exposed E11.621 Type 2 diabetes mellitus with foot ulcer L97.512 Non-pressure chronic ulcer of 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d I73.89 Other specified peripheral vascular diseases N18.6 End stage renal disease Z99.2 Dependence on renal dialysis I10 Essential (primary) hypertension Follow-up Appointments Return Appointment in 1 week. Dressing Change Frequency Do not change entire dressing for one week. - all wounds Skin Barriers/Peri-Wound Care Moisturizing lotion - both legs Wound Cleansing May shower with protection. Primary Wound Dressing Wound #12 Left,Proximal,Anterior Lower Leg Calcium Alginate with Silver Wound #13 Left,Medial Lower Leg Calcium Alginate with Silver Wound #14 Left,Anterior Lower Leg Calcium Alginate with Silver Wound #15 Left,Posterior Lower Leg Calcium Alginate with Silver Secondary Dressing Wound #12 Left,Proximal,Anterior Lower Leg ABD pad Zetuvit or Kerramax Wound #13 Left,Medial Lower Leg ABD pad Zetuvit or Kerramax Wound #14 Left,Anterior Lower Leg ABD pad Zetuvit or Kerramax Wound #15 Left,Posterior Lower  Leg ABD pad Zetuvit or Kerramax Edema Control Kerlix and Coban - Bilateral Avoid standing for long periods of time Elevate legs to the level of the heart or above for 30 minutes daily and/or when sitting,  a frequency of: Off-Loading Other: - foam donut right plantar callous [facility]. - [facility] - hold wound care this week Electronic Signature(s) Signed: 06/08/2019 4:54:37 PM By: Worthy Keeler PA-C Signed: 06/08/2019 5:54:02 PM By: [name] RN, BSN Entered By: [name] on 06/08/2019 15:26:30 -------------------------------------------------------------------------------- Problem List Details Patient Name: Date of Service: [name] 06/08/2019 1:45 PM Medical Record [id number] Patient Account Number: [id number] Date of Birth/Sex: Treating RN: Mar 12, 1946 (74 y.o. [name] Primary Care Provider: [name] Other Clinician: Referring Provider: Treating Provider/Extender:Stone III, Lemar Livings, Abelino Derrick in Treatment: 3 Active Problems ICD-10 Evaluated Encounter Code Description Active Date Today Diagnosis I87.2 Venous insufficiency (chronic) (peripheral) 05/18/2019 No Yes I89.0 Lymphedema, not elsewhere classified 05/18/2019 No Yes L97.812 Non-pressure chronic ulcer of other part of right lower 05/18/2019 No Yes leg with fat layer exposed L97.822 Non-pressure chronic ulcer of other part of left lower 05/18/2019 No Yes leg with fat layer exposed E11.621 Type 2 diabetes mellitus with foot ulcer 05/18/2019 No Yes L97.512 Non-pressure chronic ulcer of other part of right foot 05/18/2019 No Yes with fat layer exposed I73.89 Other specified peripheral vascular diseases 05/18/2019 No Yes N18.6 End stage renal disease 05/18/2019 No Yes Z99.2 Dependence on renal dialysis 05/18/2019 No Yes I10 Essential (primary) hypertension 05/18/2019 No Yes Inactive Problems Resolved Problems Electronic Signature(s) Signed: 06/08/2019 2:25:35 PM By: Worthy Keeler PA-C Entered By: Worthy Keeler on 06/08/2019  14:25:34 -------------------------------------------------------------------------------- Progress Note Details Patient Name: Date of Service: [name] 06/08/2019 1:45 PM Medical Record [id number] Patient Account Number: [id number] Date of Birth/Sex: Treating RN: April 01, 1946 (73 y.o. [name] Primary Care Provider: [name] Other Clinician: Referring Provider: Treating Provider/Extender:Stone III, Lemar Livings, Lendon Ka Weeks in Treatment: 3 Subjective Chief Complaint Information obtained from Patient Bilateral LE ulcers and Right plantar foot ulcer History of Present Illness (HPI) 02/23/2019 on evaluation today patient presents for initial evaluation in our clinic concerning issues that she has been having with her bilateral lower extremities as well as her plantar foot on the right. This has been quite some time in regard to the foot ulcer even back as far as the beginning of this year that is 2020. She has had the wounds on her legs a much shorter time but that still has been roughly 2-3 months. Nonetheless she is really not shown signs of a lot of improvement. She tells me that she does have a past medical history of diabetes, venous insufficiency, lymphedema which is mild but nonetheless present, peripheral vascular disease and she has previously been recommended to have a femoral-popliteal bypass but put that off at that point they are basically just monitoring at this time. She has end-stage renal disease with dependence on renal dialysis, hypertension, and obviously wounds of the bilateral lower extremities at this point. She tells me that she is having some discomfort but fortunately nothing too significant at this point which is good news. No fevers, chills, nausea, vomiting, or diarrhea. She is mainly been leaving these areas open to air as much as possible at this point at 1 point she was trying to keep them covered more but that really did not  help either. Fortunately there  is no signs of active systemic nor local infection at this time. 03/02/2019 on evaluation today patient appears to be doing well with regard to her wounds. The leg ulcers which were very dry are much more moist at this point and seem to be doing great at this time. Overall I am very pleased with how things seem to be progressing. With regard to her foot ulcer it is showing some signs of slight improvement in my opinion overall there does not appear to be anything worse at least which is also good news. No fevers, chills, nausea, vomiting, or diarrhea. 03/09/2019 on evaluation today patient appears to be doing better with regard to her lower extremity ulcers a lot of the necrotic tissue is loosening up which is good news. In regard to her right plantar foot ulcer this appears to be roughly the same based on what I am seeing currently although there is less callus buildup. Unfortunately she does have an open wound area on the left lateral foot region which unfortunately probes down to bone once this was thoroughly examined. This is something she did not even know was opened she had had a Band-Aid over it just for protection last couple times I saw her. 03/16/19 evaluation today patient appears to be doing Someone poorly in regard to her left lower extremity. This seems to be likely infected based on what Ioom seeing at this point. Thereoos no signs of active infection at this time systemically but nonetheless thereoos a lot more erythema of the foot in general unfortunately. Her x-rays were reviewed and did not show any evidence of infection of the right foot. On the left there was a A little bit more going on but no obvious evidence of osteomyelitis though it was mentioned that if that was still a concern that number I would be the best way to further evaluate this. I think they were going to need to proceed with MRI to be honest based on what Ioom seeing  today. 03/23/2019 on evaluation today patient actually appears to be doing worse compared to even last week's visit with regard to her wounds especially the left foot but even the right lower extremity. Both are measuring deeper as far as the overall depth of the wound on the left foot this also seems to have spread as far as the open wound location. It also does appear to me that the cellulitis has spread further up her leg which also has me very concerned at this point. There fortunately is no signs of active infection at this point systemically such as evidence of sepsis but nonetheless I am still concerned about the fact that again the patient does not seem to be turning around. We did have her on IV vancomycin through dialysis which they graciously help this with as well. With that being said it appears based on her culture results that I did review today that cefepime or something of the like would likely be a better option the vancomycin is not likely to help with the Serratia species that was identified. 03/28/19 Fem-Pop 04/06/19 Amp toe 45month follow-up with vascular in april 05/18/2019 on evaluation today patient presents for reevaluation here in the clinic after having been sent to the ER by myself December 2020 due to overall worsening of her condition. She did have a femoral-popliteal bypass performed on 03/28/2019. 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 Subsequently she does have a 22-month  follow-up with vascular in April but seems to be doing well from a vascular standpoint at this time. Overall the main issue for which she comes in today she does have a area on the plantar aspect of her right foot where she does have a callus there may or may not be a wound under this region. Subsequently she does have several small ulcerations on the bilateral lower extremities that are more venous stasis in nature. Continue to  these do need to be addressed today as well. Fortunately there is no signs of active infection at this time. 05/25/2019 upon evaluation today patient appears to be doing well in regard to her lower extremities. Fortunately there is no signs of active infection at this time. No fever chills noted. She has been tolerating the dressing changes without complication. Fortunately she is doing much better in regard to her plantar foot in fact this appears to be completely healed which is excellent news. 06/01/2019 on evaluation today patient actually appears to be doing very well in regard to her wounds at this point. Fortunately there is no signs of active infection. No fevers, chills, nausea, vomiting, or diarrhea. 06/08/2019 upon evaluation today patient actually appears to be doing great in regard to her right lower extremity there are no open wounds remaining and she has done extremely well. Unfortunately she has several blistered areas that opened up on the left lower extremity in the interim between last week and this week. She has not been wearing any compression she states that I never told her anything about wearing compression. With that being said this is something that we have discussed in the past and it is something that we wanted her to do and again I did remind her of that today as well. Objective Constitutional Well-nourished and well-hydrated in no acute distress. Vitals Time Taken: 2:20 PM, Height: 64 in, Weight: 160 lbs, BMI: 27.5, Temperature: 99.6 F, Respiratory Rate: 18 breaths/min. Respiratory normal breathing without difficulty. Psychiatric this patient is able to make decisions and demonstrates good insight into disease process. Alert and Oriented x 3. pleasant and cooperative. General Notes: Has several blisters noted on the left lower extremity unfortunately which do not seem to be doing nearly as well at this point. Fortunately there is no signs of active infection at  this time. No fever chills noted. With that being said she does have swelling and erythema noted unfortunately which I think is secondary to the fact she has not been wearing any compression over the past week. Integumentary (Hair, Skin) Wound #12 status is Open. Original cause of wound was Blister. The wound is located on the Left,Proximal,Anterior Lower Leg. The wound measures 3.3cm length x 4.4cm width x 0.1cm depth; 11.404cm^2 area and 1.14cm^3 volume. There is Fat Layer (Subcutaneous Tissue) Exposed exposed. There is no tunneling or undermining noted. There is a medium amount of serosanguineous drainage noted. The wound margin is distinct with the outline attached to the wound base. There is large (67-100%) pink, pale granulation within the wound bed. There is a small (1-33%) amount of necrotic tissue within the wound bed including Adherent Slough. Wound #13 status is Open. Original cause of wound was Blister. The wound is located on the Left,Medial Lower Leg. The wound measures 0.5cm length x 0.8cm width x 0.1cm depth; 0.314cm^2 area and 0.031cm^3 volume. There is Fat Layer (Subcutaneous Tissue) Exposed exposed. There is no tunneling or undermining noted. There is a medium amount of serosanguineous drainage noted. The wound margin  is distinct with the outline attached to the wound base. There is medium (34-66%) red, pink granulation within the wound bed. There is a medium (34-66%) amount of necrotic tissue within the wound bed including Adherent Slough. Wound #14 status is Open. Original cause of wound was Blister. The wound is located on the Left,Anterior Lower Leg. The wound measures 3cm length x 3cm width x 0.1cm depth; 7.069cm^2 area and 0.707cm^3 volume. There is Fat Layer (Subcutaneous Tissue) Exposed exposed. There is no tunneling or undermining noted. There is a medium amount of serosanguineous drainage noted. The wound margin is distinct with the outline attached to the wound base.  There is medium (34-66%) red, pink granulation within the wound bed. There is a medium (34-66%) amount of necrotic tissue within the wound bed including Adherent Slough. Wound #15 status is Open. Original cause of wound was Blister. The wound is located on the Left,Posterior Lower Leg. The wound measures 3cm length x 3cm width x 0.1cm depth; 7.069cm^2 area and 0.707cm^3 volume. There is Fat Layer (Subcutaneous Tissue) Exposed exposed. There is no tunneling or undermining noted. There is a medium amount of serosanguineous drainage noted. The wound margin is distinct with the outline attached to the wound base. There is large (67-100%) red granulation within the wound bed. There is no necrotic tissue within the wound bed. Wound #8 status is Open. Original cause of wound was Gradually Appeared. The wound is located on the Right,Anterior Lower Leg. The wound measures 0cm length x 0cm width x 0cm depth; 0cm^2 area and 0cm^3 volume. Wound #9 status is Open. Original cause of wound was Gradually Appeared. The wound is located on the Right,Medial,Posterior Lower Leg. The wound measures 0cm length x 0cm width x 0cm depth; 0cm^2 area and 0cm^3 volume. 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 Dressing Change Frequency: Do not change entire dressing for one week. - all wounds Skin Barriers/Peri-Wound Care: Moisturizing lotion - both legs Wound  Cleansing: May shower with protection. Primary Wound Dressing: Wound #12 Left,Proximal,Anterior Lower Leg: Calcium Alginate with Silver Wound #13 Left,Medial Lower Leg: Calcium Alginate with Silver Wound #14 Left,Anterior Lower Leg: Calcium Alginate with Silver Wound #15 Left,Posterior Lower Leg: Calcium Alginate with Silver Secondary Dressing: Wound #12 Left,Proximal,Anterior Lower Leg: ABD pad Zetuvit or Kerramax Wound #13 Left,Medial Lower Leg: ABD pad Zetuvit or Kerramax Wound #14 Left,Anterior Lower Leg: ABD pad Zetuvit or Kerramax Wound #15 Left,Posterior Lower Leg: ABD pad Zetuvit or Kerramax Edema Control: Kerlix and Coban - Bilateral Avoid standing for long periods of time Elevate legs to the level of the heart or above for 30 minutes daily and/or when sitting, a frequency of: Off-Loading: Other: - foam donut right plantar callous Home Health: [facility] for wound care. - [facility] - [facility] wound care this week 1. Again I did reiterate to the patient that she does need to be wearing compression stockings at all times when she heals and gets out of the compression wraps. She should be wearing these again during the day she  can take them off at night. We have recommended both the use of the compression stockings as well as elevation as well as walking but not standing still or with her feet on the floor for too long. Nonetheless all things considered this is something I did reiterate to her today as well. 2. I am also going to suggest at this point that we go ahead and continue with the Curlex and Coban wraps bilaterally we will hopefully be able to get the blisters healed and under control fairly readily as again I do not see any signs of anything being deep and there is no evidence of significant infection at this point which is also good news. We will see patient back for reevaluation in 1 week here in the clinic. If anything  worsens or changes patient will contact our office for additional recommendations. Electronic Signature(s) Signed: 06/08/2019 3:33:12 PM By: Worthy Keeler PA-C Entered By: Worthy Keeler on 06/08/2019 15:33:12 -------------------------------------------------------------------------------- SuperBill Details Patient Name: Date of Service: [name] 06/08/2019 Medical Record [id number] Patient Account Number: [id number] Date of Birth/Sex: Treating RN: 1945-08-05 (74 y.o. [name] Primary Care Provider: [name] Other Clinician: Referring Provider: Treating Provider/Extender:Stone III, Lemar Livings, Lendon Ka Weeks in Treatment: 3 Diagnosis Coding ICD-10 Codes Code Description I87.2 Venous insufficiency (chronic) (peripheral) I89.0 Lymphedema, not elsewhere classified L97.812 Non-pressure chronic ulcer of other part of right lower leg with fat layer exposed L97.822 Non-pressure chronic ulcer of other part of left lower leg with fat layer exposed E11.621 Type 2 diabetes mellitus with foot ulcer L97.512 Non-pressure chronic ulcer of 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d I73.89 Other specified peripheral vascular diseases N18.6 End stage renal disease Z99.2 Dependence on renal dialysis I10 Essential (primary) hypertension Facility Procedures CPT4 Code: [id number] Description: [phone] - WOUND CARE VISIT-LEV 5 EST PT Modifier: Quantity: 1 Physician Procedures CPT4 Code Description: [id number] - WC PHYS LEVEL 3 - EST PT ICD-10 Diagnosis Description I87.2 Venous insufficiency (chronic) (peripheral) I89.0 Lymphedema, not elsewhere classified A16.553 Non-pressure chronic ulcer of other part of right lower  L97.822 Non-pressure chronic ulcer of other part of left lower Modifier: leg with fat la leg with fat lay Quantity: 1 yer exposed er exposed Electronic Signature(s) Signed: 06/08/2019 5:54:02 PM By: [name] RN, BSN Signed: 06/09/2019 5:38:12  PM By: Worthy Keeler PA-C Previous Signature: 06/08/2019 3:33:24 PM Version By: Worthy Keeler PA-C Entered By: [name] on 06/08/2019 17:23:45

## 2019-06-08 NOTE — Progress Notes (Signed)
The patient is now 12 days status post a closed reduction of a left shoulder dislocation.  The shoulder had been out for a week before we took her to the operating room and reduce it.  She is 74 years old.  She has been wearing her sling for the last 12 days.  We did see her last week with x-rays showing the shoulder is well located.  On clinical exam the shoulder still seems to be well located.  An AP view and a scapular Y view show that the glenohumeral joint is reduced and the left shoulder is in place.  At this point she can stop wearing the sling but I want her to avoid abduction of the shoulder and external rotation.  I will see her back in 2 weeks with that visit having 3 views of the left shoulder.  If there is any issues before then she will let us know.

## 2019-06-09 DIAGNOSIS — D631 Anemia in chronic kidney disease: Secondary | ICD-10-CM | POA: Diagnosis not present

## 2019-06-09 DIAGNOSIS — N2581 Secondary hyperparathyroidism of renal origin: Secondary | ICD-10-CM | POA: Diagnosis not present

## 2019-06-09 DIAGNOSIS — Z992 Dependence on renal dialysis: Secondary | ICD-10-CM | POA: Diagnosis not present

## 2019-06-09 DIAGNOSIS — N186 End stage renal disease: Secondary | ICD-10-CM | POA: Diagnosis not present

## 2019-06-09 DIAGNOSIS — D509 Iron deficiency anemia, unspecified: Secondary | ICD-10-CM | POA: Diagnosis not present

## 2019-06-11 DIAGNOSIS — Z992 Dependence on renal dialysis: Secondary | ICD-10-CM | POA: Diagnosis not present

## 2019-06-11 DIAGNOSIS — D631 Anemia in chronic kidney disease: Secondary | ICD-10-CM | POA: Diagnosis not present

## 2019-06-11 DIAGNOSIS — N2581 Secondary hyperparathyroidism of renal origin: Secondary | ICD-10-CM | POA: Diagnosis not present

## 2019-06-11 DIAGNOSIS — D509 Iron deficiency anemia, unspecified: Secondary | ICD-10-CM | POA: Diagnosis not present

## 2019-06-11 DIAGNOSIS — N186 End stage renal disease: Secondary | ICD-10-CM | POA: Diagnosis not present

## 2019-06-13 DIAGNOSIS — N186 End stage renal disease: Secondary | ICD-10-CM | POA: Diagnosis not present

## 2019-06-13 DIAGNOSIS — T82858A Stenosis of vascular prosthetic devices, implants and grafts, initial encounter: Secondary | ICD-10-CM | POA: Diagnosis not present

## 2019-06-13 DIAGNOSIS — I871 Compression of vein: Secondary | ICD-10-CM | POA: Diagnosis not present

## 2019-06-13 DIAGNOSIS — Z992 Dependence on renal dialysis: Secondary | ICD-10-CM | POA: Diagnosis not present

## 2019-06-14 DIAGNOSIS — D509 Iron deficiency anemia, unspecified: Secondary | ICD-10-CM | POA: Diagnosis not present

## 2019-06-14 DIAGNOSIS — Z992 Dependence on renal dialysis: Secondary | ICD-10-CM | POA: Diagnosis not present

## 2019-06-14 DIAGNOSIS — D631 Anemia in chronic kidney disease: Secondary | ICD-10-CM | POA: Diagnosis not present

## 2019-06-14 DIAGNOSIS — N186 End stage renal disease: Secondary | ICD-10-CM | POA: Diagnosis not present

## 2019-06-14 DIAGNOSIS — N2581 Secondary hyperparathyroidism of renal origin: Secondary | ICD-10-CM | POA: Diagnosis not present

## 2019-06-14 NOTE — Progress Notes (Signed)
Patient ID: [name], female   DOB: [birth year], 74 y.o.   MRN: [id number]  MEDICARE ANNUAL WELLNESS VISIT AND FOLLOW UP  Assessment:   Diagnoses and all orders for this visit:  Encounter for Medicare annual wellness exam Reminded to schedule mammogram, diabetes eye, dental appointments  Peripheral vascular disease (HCC)/ s/p fem/pop bypass surgery  Follows with Dr. Donnetta Hutching  Control blood pressure, cholesterol, glucose, increase exercise.   Coronary artery disease involving coronary bypass graft of native heart without angina pectoris Control blood pressure, cholesterol, glucose, increase exercise.  Followed by Dr. Johnsie Cancel  Chronic combined systolic and diastolic congestive heart failure ([place]) Managed by dialysis Encouraged daily monitoring of the patient's weight, call office if 5 lb weight loss or gain in a day.  Encouraged regular exercise. If any increasing shortness of breath, swelling, or chest pressure go to ER immediately.  decrease your fluid intake to less than 2 L daily please remember to always increase your potassium intake with any increase of your fluid pill.   ESRD (end stage renal disease) ([place]) Continue dialysis Followed by Dr. Hollie Salk & colleagues  Type II diabetes mellitus with neurological manifestations, uncontrolled ([place]) Discussed general issues about diabetes pathophysiology and management., Educational material distributed., Suggested low cholesterol diet., Encouraged aerobic exercise., Discussed foot care., Reminded to get yearly retinal exam. -     Hemoglobin A1c - REMINDED TO GET EYE EXAM  Diabetes mellitus type 2, insulin dependent ([place]) Discussed general issues about diabetes pathophysiology and management., Educational material distributed., Suggested low cholesterol diet., Encouraged aerobic exercise., Discussed foot care., Reminded to get yearly retinal exam. -     Hemoglobin A1c - REMINDED TO GET EYE EXAM  EMPHYSEMA Well controlled off of  inhalers, continue to monitor  Asthma mild intermittent without exacerbation Well controlled off of inhalers, continue to monitor  Malignant neoplasm of upper-inner quadrant of left breast in female, estrogen receptor positive ([place]) Off of anastrozole after 5 years, followed by Dr. 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   Reminder to go to the ER if any CP, SOB, nausea, dizziness, severe HA, changes vision/speech, left arm numbness and tingling and jaw pain.  Mitral valve mass Follow up with Dr. Johnsie Cancel as recommended  Gouty arthritis Continue allopurinol Diet discussed Check uric acid as needed  Vitamin D deficiency At goal at recent check; continue to recommend supplementation for goal of 70-100 Defer vitamin D level  Medication management CBC, CMP/GFR, magnesium  Hyperlipidemia associated with T2DM (HCC) Continue medications- atorvastatin  LDL goal <70 Continue low cholesterol diet and exercise.  Check lipid panel.   Bradycardia Monitor  BMI 29.0-29.9,adult  Anemia, of chronic disease ([place]) Managed by nephrology  -CBC  Mitral valve mass Follows with Dr. Johnsie Cancel q24m  Anterior dislocation of left shoulder S/p closed reduction under anesthesia by Dr. Ninfa Linden; appears intact today  follow up next week as scheduled  Debility/weakness/ high risk falls Resume PT pending clearance by ortho   Over 30 minutes of exam, counseling, chart review, and critical decision making was performed   Future Appointments  Date Time Provider [place]  06/15/2019  2:15 PM Jeri Cos Ajo III, [place]hal Coria [facility]  06/22/2019  1:45 PM Mcarthur Rossetti, MD  [facility]  None  08/01/2019 11:00 AM [facility] VASC 3 - EM [facility]  08/01/2019 11:45 AM [facility] PA [facility]  08/01/2019 12:00 PM [facility] VASC 3 - EM [facility]  08/03/2019  2:30 PM Unk Pinto, MD [facility] None  02/02/2020  3:00 PM Unk Pinto, MD [facility] None  05/08/2020 11:15 AM Liane Comber, NP [facility] None     Plan:   During the course of the visit the patient was educated and counseled about appropriate screening and preventive services including:    Pneumococcal vaccine   Influenza vaccine  Td vaccine  Prevnar 13  Screening electrocardiogram  Screening mammography  Bone densitometry screening  Colorectal cancer screening  Diabetes screening  Glaucoma screening  Nutrition counseling   Advanced directives: given info/requested copies    Subjective:   [name] is a 74 y.o. female who presents for Medicare Annual Wellness Visit and 3 month follow up on hypertension, diabetes, hyperlipidemia, vitamin D def.   While hospitalized [place] density on the mitral valve was incidentally found TTE on 8/23: confirmed by TEE on 8/27. Blood cultures were negative, clinical presentation did not suggest endocarditis, per inpatient cardiology thought to be a calcified subchondral apparatus without further recommendation for workup at the time. Outpatient cardiology was recommended, follows with Dr. Nishan q23m.   Patient was admitted for left little toe osteomyelitis in 03/2019, abscess and sepsis complicating PAD. S/p aortogram with bilateral lower extremity arteriogram with runoff 12/18 followed by left CFA endarterectomy and Dacron patch angioplasty, left femoral to below-knee popliteal bypass graft on 12/2 by Dr. Donnetta Hutching. MRI of left foot showed septic arthritis of the fifth MTP joint with osteomyelitis involving the distal half of the fifth metatarsal and also the proximal phalanx. Orthopedic service is consulted in regards to findings of MRI of left  foot Dr. Sharol Given and underwent left fifth ray amputation 04/06/2019. Was later readmitted for rehab due to debility. She apparently had a fall following discharge resulting in left anterior shoulder dislocation, underwent closed reduction by Dr. Ninfa Linden on 05/27/2019. Has follow up planned next Wednesday 06/22/2019.   She continues to follow with wound center weekly, reports "blisters" ? Stasis ulcers, has compression dressings bil lower extremity changed there. Per patient no active foot wounds at last check.   She has had breast cancer in 2014, s/p lumpectomy, follows with [name], she completed 5 years on anastrozole. Continues with annual mammograms.   She has ESRD recently initiated on hemodialysis, Tues, Thurs, Sat; she is followed by Dr. Hollie Salk & colleagues.  BMI is Body mass index is 27.74 kg/m., she has not been working on diet and exercise. Was doing PT but on break due to shoulder dislocation, will resume next week pending ortho.  Wt Readings from Last 3 Encounters:  06/15/19 156 lb 9.6 oz (71 kg)  05/27/19 162 lb (73.5 kg)  05/03/19 160 lb (72.6 kg)   Her blood pressure has been controlled at home, today their BP is BP: 118/68 not on medications secondary to dialysis She does not workout. She denies chest pain, shortness of breath, dizziness.   She is on cholesterol medication atorvastatin 10 mg daily and denies myalgias. Her cholesterol is at goal. The cholesterol last visit was:   Lab Results  Component Value Date   CHOL 103 01/10/2019   HDL 46 (L) 01/10/2019   LDLCALC 40 01/10/2019   TRIG 87 01/10/2019   CHOLHDL 2.2 01/10/2019   She has been working on diet and exercise for Diabetes  with diabetic chronic kidney disease, with other circulatory complications and with diabetic polyneuropathy, she is on bASA, she is not on ACE/ARB, she is on novolin 70/30, taking 15-20 units and 10-20 in the evening, and denies paresthesia of the feet, polydipsia, polyuria and visual  disturbances. Fasting has been well controlled, 80-120. Last A1C was:  Lab Results  Component Value Date   HGBA1C 5.7 (H) 05/27/2019   She has ESRD, follows with Dr. Hollie Salk, has hemodialysis 3 days a week.  Lab Results  Component Value Date   GFRNONAA 13 (L) 05/27/2019   Patient is on Vitamin D supplement. Lab Results  Component Value Date   VD25OH 50 01/10/2019     Patient is on allopurinol for gout and does not report a recent flare.  Lab Results  Component Value Date   LABURIC 3.6 01/10/2019  '    Medication Review Current Outpatient Medications on File Prior to Visit  Medication Sig Dispense Refill  . allopurinol (ZYLOPRIM) 100 MG tablet Take 1 tablet 2 x /day to Prevent Gout (Patient taking differently: Take 100 mg by mouth 2 (two) times daily. ) 180 tablet 3  . ARTIFICIAL TEAR SOLUTION OP Place 1 drop into both eyes in the morning and at bedtime.    Marland Kitchen aspirin EC 81 MG tablet Take 1 tablet (81 mg total) by mouth daily. (Patient taking differently: Take 81 mg by mouth daily. Skip dose if you take an excedrin migraine)    . aspirin-acetaminophen-caffeine (EXCEDRIN EXTRA STRENGTH) 250-250-65 MG tablet Take 1 tablet by mouth 2 (two) times daily as needed (pain).    Marland Kitchen b complex-vitamin c-folic acid (NEPHRO-VITE) 0.8 MG TABS tablet Take 1 tablet by mouth daily.     . Bisacodyl (LAXATIVE PO) Take 1 capsule by mouth 3 (three) times daily.    . cinacalcet (SENSIPAR) 30 MG tablet Take 1 tablet (30 mg total) by mouth daily with supper. 60 tablet 1  . Darbepoetin Alfa (ARANESP) 100 MCG/0.5ML SOSY injection Inject 0.5 mLs (100 mcg total) into the vein every Tuesday with hemodialysis. 4.2 mL   . docusate sodium (COLACE) 100 MG capsule Take 200 mg by mouth 2 (two) times daily.     Marland Kitchen doxercalciferol (HECTOROL) 4 MCG/2ML injection Inject 2 mLs (4 mcg total) into the vein Every Tuesday,Thursday,and Saturday with dialysis. 2 mL   . ferric gluconate 125 mg in sodium chloride 0.9 % 100 mL Inject  125 mg into the vein Every Tuesday,Thursday,and Saturday with dialysis.    Marland Kitchen gabapentin (NEURONTIN) 100 MG capsule Take 1 capsule (100 mg total) by mouth 2 (two) times daily. (Patient taking differently: Take 100 mg by mouth as needed. ) 60 capsule 1  . hydrALAZINE (APRESOLINE) 25 MG tablet Take 3 tablets (75 mg total) by mouth every 8 (eight) hours. 270 tablet 1  . HYDROcodone-acetaminophen (NORCO) 5-325 MG tablet Take 1 tablet by mouth every 6 (six) hours as needed for moderate pain. 30 tablet 0  . insulin NPH-regular Human (70-30) 100 UNIT/ML injection Inject 10-30 Units into the skin 2 (two) times daily with a meal.     . isosorbide dinitrate (ISORDIL) 10 MG tablet Take 1 tablet 3 x /day for BP & Heart (Patient taking differently: Take 10 mg by mouth 3 (three) times daily. ) 270 tablet 3  . Multiple Vitamins-Minerals (PRESERVISION AREDS 2 PO) Take 2 capsules by mouth daily at 12 noon.     . Probiotic Product ([place]) CAPS Take 1 capsule by mouth in  the morning and at bedtime.    . sevelamer carbonate (RENVELA) 800 MG tablet Take 2 tablets (1,600 mg total) by mouth 3 (three) times daily with meals. (Patient taking differently: Take 1,600 mg by mouth See admin instructions. Take 1600 mg with each meal and each snack) 90 tablet 1  . traZODone (DESYREL) 50 MG tablet Take 50 mg by mouth at bedtime as needed for sleep.    Marland Kitchen atorvastatin (LIPITOR) 10 MG tablet Take 1 tablet (10 mg total) by mouth daily at 6 PM. (Patient not taking: Reported on 06/15/2019) 30 tablet 0  . Homeopathic Products (THERAWORX RELIEF EX) Apply 1 application topically daily as needed (pain).     No current facility-administered medications on file prior to visit.    Current Problems (verified) Patient Active Problem List   Diagnosis Date Noted  . Anterior dislocation of left shoulder 05/27/2019  . Neuropathic pain   . Labile blood glucose   . Debility 04/07/2019  . 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 S/P femoral-popliteal bypass surgery 04/07/2019  . Chronic bilateral low back pain without sciatica   . Abscess of right foot   . Hyperlipidemia associated with type 2 diabetes mellitus ([place]) 01/09/2019  . Diabetes mellitus due to underlying condition with chronic kidney disease on chronic dialysis, with long-term current use of insulin ([place]) 01/09/2019  . ESRD on hemodialysis ([place])   . Generalized weakness   . Mitral valve mass   . Coronary artery disease involving coronary bypass graft of native heart without angina pectoris   . Bradycardia   . Anemia 05/03/2017  . Diabetes mellitus type 2, insulin dependent ([place]) 05/08/2015  . Overweight (BMI 25.0-29.9) 02/14/2015  . Medication management 08/04/2014  . Diabetic neuropathy ([place]) 02/17/2014  . Gouty arthritis   . Vitamin D deficiency   . Malignant neoplasm of upper-inner quadrant of left breast in female, estrogen receptor positive ([place]) 02/14/2013  . PVD (peripheral vascular disease) ([place]) 09/05/2009  . Essential hypertension 06/20/2009  . Congestive heart failure ([place]) 06/20/2009  . Asthma 06/20/2009    Screening Tests Immunization History  Administered Date(s) Administered  . Influenza, High Dose Seasonal PF 01/30/2014, 02/14/2015, 12/17/2015, 01/22/2017, 01/07/2018  . Influenza-Unspecified 02/05/2013  . Pneumococcal Conjugate-13 01/30/2014  . Pneumococcal Polysaccharide-23 01/07/2011  . Td 01/13/2012  . Zoster 01/13/2012    Preventative care: Last colonoscopy: 2011, due 03/2020 Last mammogram: 11/2017, DUE - patient has number to schedule DEXA:12/2016 - normal Echo 2019   Prior vaccinations: TD or Tdap: 2013  Influenza: per patient had in fall 2020 Pneumococcal: 2012  Prevnar13: 2015 Shingles/Zostavax: 2013  Names of Other Physician/Practitioners you currently use: 1. [facility]- here for primary care 2. Dr. Bing Plume, eye doctor, last visit  2017 - NEEDS TO SCHEDULE need eye report - reminded to schedule, phone number provided  3. Dr. Satira Sark, Dr. Mallie Snooks , dentist, last visit feb 2018  Patient Care Team: Unk Pinto, MD as PCP - General (Internal Medicine) Josue Hector, MD as PCP - Cardiology (Cardiology) Magrinat, Virgie Dad, MD as Consulting Physician (Oncology) Kyung Rudd, MD as Consulting Physician (Radiation Oncology) Newt Minion, MD as Consulting Physician (Orthopedic Surgery) Philemon Kingdom, MD as Consulting Physician (Internal Medicine) Josue Hector, MD as Consulting Physician (Cardiology) Inda Castle, MD (Inactive) as Consulting Physician (Gastroenterology) Rolm Bookbinder, MD as Consulting Physician (General Surgery) Calvert Cantor, MD as Consulting Physician (Ophthalmology) Madelon Lips, MD as Consulting  Physician (Nephrology) Allergies Allergies  Allergen Reactions  . Atenolol Rash and Other (See Comments)    Exacerbates gout   . Adhesive [Tape] Other (See Comments)    SKIN IS VERY SENSITIVE AND BRUISES AND TEARS EASILY; PLEASE USE AN ALTERNATIVE TO TAPE!!  . Contrast Media [Iodinated Diagnostic Agents] Rash  . Iohexol Rash  . Latex Rash    SURGICAL HISTORY She  has a past surgical history that includes Cataract extraction w/ intraocular lens  implant, bilateral (2000's); Finger surgery (Right); Coronary artery bypass graft (2011); Dental surgery (04/30/11); AMPUTATION RAY (Right, 08/27/2012); Cardiac catheterization (05/24/2009); Amputation (Right, 08/27/2012); Eye surgery; Colonoscopy w/ biopsies and polypectomy (2010); Breast lumpectomy (04/20/2013); Breast lumpectomy with needle localization and axillary sentinel lymph node bx (Left, 04/20/2013); Dilation and curettage of uterus; Amputation (Left, 07/08/2013); Amputation toe (Right, 03/06/2017); AV fistula placement (Right, 11/11/2017); IR Fluoro Guide CV Line Right (11/30/2017); TEE without cardioversion (N/A, 12/01/2017); ABDOMINAL  AORTOGRAM W/LOWER EXTREMITY (Bilateral, 03/25/2019); Endarterectomy femoral (Left, 03/28/2019); Femoral-popliteal Bypass Graft (Left, 03/28/2019); Patch angioplasty (Left, 03/28/2019); Amputation (Left, 04/06/2019); and Shoulder Closed Reduction (Left, 05/27/2019). FAMILY HISTORY Her family history includes Breast cancer in her maternal aunt; Breast cancer (age of onset: 75) in her sister; Breast cancer (age of onset: 34) in her sister; Hypertension in her brother; Kidney cancer (age of onset: 48) in her brother; Throat cancer (age of onset: 2) in her father. SOCIAL HISTORY She  reports that she has never smoked. She has never used smokeless tobacco. She reports previous alcohol use. She reports that she does not use drugs.  MEDICARE WELLNESS OBJECTIVES: Physical activity: Current Exercise Habits: The patient does not participate in regular exercise at present(is doing PT, taking a break, will restart next week pending ortho clearance), Exercise limited by: orthopedic condition(s) Cardiac risk factors: Cardiac Risk Factors include: advanced age (>21men, >59 women);dyslipidemia;hypertension;diabetes mellitus;sedentary lifestyle Depression/mood screen:   Depression screen [facility] 2/9 06/15/2019  Decreased Interest 0  Down, Depressed, Hopeless 1  PHQ - 2 Score 1  Some recent data might be hidden    ADLs:  In your present state of health, do you have any difficulty performing the following activities: 06/15/2019 04/07/2019  Hearing? N -  Vision? N -  Difficulty concentrating or making decisions? N -  Walking or climbing stairs? N -  Comment manages well in home, uses walking stick in community -  Dressing or bathing? N -  Doing errands, shopping? N N  Comment husband typically drives, she can if needed -  Some recent data might be hidden     Cognitive Testing  Alert? Yes  Normal Appearance?Yes  Oriented to person? Yes  Place? Yes   Time? Yes  Recall of three objects?  Yes  Can perform simple  calculations? Yes  Displays appropriate judgment?Yes  Can read the correct time from a watch face?Yes  EOL planning: Does Patient Have a Medical Advance Directive?: Yes Type of Advance Directive: Healthcare Power of Attorney, Living will Does patient want to make changes to medical advance directive?: No - Patient declined Copy of [employer] in Chart?: No - copy requested   Objective:   Today's Vitals   06/15/19 1128 06/15/19 1154  BP: 118/68   Pulse: (!) 108 89  Temp: (!) 96.6 F (35.9 C)   SpO2: 97%   Weight: 156 lb 9.6 oz (71 kg)    Body mass index is 27.74 kg/m.  General appearance: alert, no distress, WD/WN,  female HEENT: normocephalic, sclerae anicteric, TMs  pearly, nares patent, no discharge or erythema, pharynx normal Oral cavity: MMM, no lesions Neck: supple, no lymphadenopathy, no thyromegaly, no masses Heart: RRR, normal S1, S2, no murmurs Lungs: CTA bilaterally, no wheezes, rhonchi, or rales Abdomen: +bs, soft, non tender, non distended, no masses, no hepatomegaly, no splenomegaly Musculoskeletal: nontender, no swelling; toe amputations bil obvious through compression dressings; 4/5 weakness throughout, slow sitting to standing Extremities: no edema, no cyanosis, no clubbing; fistula to RUE intact with thrill, dressing in place; bil legs with compression dressing in place up to knees Neurological: alert, oriented x 3, CN2-12 intact, strength symmetrical but weak throughout 4/5, no cerebellar signs, gait slow steady with walking stick Psychiatric: normal affect, behavior normal, pleasant  Breast: defer Gyn: defer Rectal: defer   Medicare Attestation I have personally reviewed: The patient's medical and social history Their use of alcohol, tobacco or illicit drugs Their current medications and supplements The patient's functional ability including ADLs,fall risks, home safety risks, cognitive, and hearing and visual impairment Diet and  physical activities Evidence for depression or mood disorders  The patient's weight, height, BMI, and visual acuity have been recorded in the chart.  I have made referrals, counseling, and provided education to the patient based on review of the above and I have provided the patient with a written personalized care plan for preventive services.     [name], NP   06/15/2019

## 2019-06-15 ENCOUNTER — Other Ambulatory Visit: Payer: Self-pay

## 2019-06-15 ENCOUNTER — Ambulatory Visit (INDEPENDENT_AMBULATORY_CARE_PROVIDER_SITE_OTHER): Payer: Medicare Other | Admitting: Adult Health

## 2019-06-15 ENCOUNTER — Encounter (HOSPITAL_BASED_OUTPATIENT_CLINIC_OR_DEPARTMENT_OTHER): Payer: Medicare Other | Admitting: Physician Assistant

## 2019-06-15 ENCOUNTER — Encounter: Payer: Self-pay | Admitting: Adult Health

## 2019-06-15 VITALS — BP 118/68 | HR 89 | Temp 96.6°F | Wt 156.6 lb

## 2019-06-15 DIAGNOSIS — Z0000 Encounter for general adult medical examination without abnormal findings: Secondary | ICD-10-CM

## 2019-06-15 DIAGNOSIS — Z89422 Acquired absence of other left toe(s): Secondary | ICD-10-CM

## 2019-06-15 DIAGNOSIS — R001 Bradycardia, unspecified: Secondary | ICD-10-CM

## 2019-06-15 DIAGNOSIS — Z992 Dependence on renal dialysis: Secondary | ICD-10-CM | POA: Diagnosis not present

## 2019-06-15 DIAGNOSIS — R5381 Other malaise: Secondary | ICD-10-CM

## 2019-06-15 DIAGNOSIS — M109 Gout, unspecified: Secondary | ICD-10-CM

## 2019-06-15 DIAGNOSIS — E119 Type 2 diabetes mellitus without complications: Secondary | ICD-10-CM

## 2019-06-15 DIAGNOSIS — Z794 Long term (current) use of insulin: Secondary | ICD-10-CM

## 2019-06-15 DIAGNOSIS — E1122 Type 2 diabetes mellitus with diabetic chronic kidney disease: Secondary | ICD-10-CM | POA: Diagnosis not present

## 2019-06-15 DIAGNOSIS — I120 Hypertensive chronic kidney disease with stage 5 chronic kidney disease or end stage renal disease: Secondary | ICD-10-CM | POA: Diagnosis not present

## 2019-06-15 DIAGNOSIS — N186 End stage renal disease: Secondary | ICD-10-CM | POA: Diagnosis not present

## 2019-06-15 DIAGNOSIS — D631 Anemia in chronic kidney disease: Secondary | ICD-10-CM

## 2019-06-15 DIAGNOSIS — E785 Hyperlipidemia, unspecified: Secondary | ICD-10-CM

## 2019-06-15 DIAGNOSIS — Z170 Estrogen receptor positive status [ER+]: Secondary | ICD-10-CM

## 2019-06-15 DIAGNOSIS — M545 Low back pain, unspecified: Secondary | ICD-10-CM

## 2019-06-15 DIAGNOSIS — Z79899 Other long term (current) drug therapy: Secondary | ICD-10-CM | POA: Diagnosis not present

## 2019-06-15 DIAGNOSIS — C50212 Malignant neoplasm of upper-inner quadrant of left female breast: Secondary | ICD-10-CM | POA: Diagnosis not present

## 2019-06-15 DIAGNOSIS — I10 Essential (primary) hypertension: Secondary | ICD-10-CM

## 2019-06-15 DIAGNOSIS — I739 Peripheral vascular disease, unspecified: Secondary | ICD-10-CM

## 2019-06-15 DIAGNOSIS — Z0001 Encounter for general adult medical examination with abnormal findings: Secondary | ICD-10-CM

## 2019-06-15 DIAGNOSIS — Z95828 Presence of other vascular implants and grafts: Secondary | ICD-10-CM

## 2019-06-15 DIAGNOSIS — E1169 Type 2 diabetes mellitus with other specified complication: Secondary | ICD-10-CM | POA: Diagnosis not present

## 2019-06-15 DIAGNOSIS — L97822 Non-pressure chronic ulcer of other part of left lower leg with fat layer exposed: Secondary | ICD-10-CM | POA: Diagnosis not present

## 2019-06-15 DIAGNOSIS — J45902 Unspecified asthma with status asthmaticus: Secondary | ICD-10-CM

## 2019-06-15 DIAGNOSIS — L97222 Non-pressure chronic ulcer of left calf with fat layer exposed: Secondary | ICD-10-CM | POA: Diagnosis not present

## 2019-06-15 DIAGNOSIS — R6889 Other general symptoms and signs: Secondary | ICD-10-CM

## 2019-06-15 DIAGNOSIS — I058 Other rheumatic mitral valve diseases: Secondary | ICD-10-CM | POA: Diagnosis not present

## 2019-06-15 DIAGNOSIS — E1142 Type 2 diabetes mellitus with diabetic polyneuropathy: Secondary | ICD-10-CM | POA: Diagnosis not present

## 2019-06-15 DIAGNOSIS — E11622 Type 2 diabetes mellitus with other skin ulcer: Secondary | ICD-10-CM | POA: Diagnosis not present

## 2019-06-15 DIAGNOSIS — E663 Overweight: Secondary | ICD-10-CM

## 2019-06-15 DIAGNOSIS — I5042 Chronic combined systolic (congestive) and diastolic (congestive) heart failure: Secondary | ICD-10-CM | POA: Diagnosis not present

## 2019-06-15 DIAGNOSIS — E0822 Diabetes mellitus due to underlying condition with diabetic chronic kidney disease: Secondary | ICD-10-CM | POA: Diagnosis not present

## 2019-06-15 DIAGNOSIS — I25810 Atherosclerosis of coronary artery bypass graft(s) without angina pectoris: Secondary | ICD-10-CM | POA: Diagnosis not present

## 2019-06-15 DIAGNOSIS — I890 Lymphedema, not elsewhere classified: Secondary | ICD-10-CM | POA: Diagnosis not present

## 2019-06-15 DIAGNOSIS — E559 Vitamin D deficiency, unspecified: Secondary | ICD-10-CM

## 2019-06-15 DIAGNOSIS — G8929 Other chronic pain: Secondary | ICD-10-CM

## 2019-06-15 DIAGNOSIS — M792 Neuralgia and neuritis, unspecified: Secondary | ICD-10-CM

## 2019-06-15 DIAGNOSIS — L97829 Non-pressure chronic ulcer of other part of left lower leg with unspecified severity: Secondary | ICD-10-CM | POA: Diagnosis not present

## 2019-06-15 DIAGNOSIS — Z9181 History of falling: Secondary | ICD-10-CM

## 2019-06-15 DIAGNOSIS — L97519 Non-pressure chronic ulcer of other part of right foot with unspecified severity: Secondary | ICD-10-CM | POA: Diagnosis not present

## 2019-06-15 MED ORDER — ATORVASTATIN CALCIUM 10 MG PO TABS: 10.0000 mg | ORAL_TABLET | Freq: Every day | ORAL | 1 refills | Status: DC

## 2019-06-15 NOTE — Progress Notes (Addendum)
[name], Pamela ([id number]) Visit Report for 06/15/2019 Chief Complaint Document Details Patient Name: Date of Service: [name], [name] 06/15/2019 2:15 PM Medical Record [id number] Patient Account Number: [id number] Date of Birth/Sex: Treating RN: Oct 18, 1945 (74 y.o. [name] Primary Care Provider: [name] Other Clinician: Referring Provider: Treating Provider/Extender:Stone III, [name], [name] Weeks in Treatment: 4 Information Obtained from: Patient Chief Complaint Bilateral LE ulcers and Right plantar foot ulcer Electronic Signature(s) Signed: 06/15/2019 3:05:42 PM By: Worthy Keeler PA-C Entered By: Worthy Keeler on 06/15/2019 15:05:41 -------------------------------------------------------------------------------- HPI Details Patient Name: Date of Service: [name] 06/15/2019 2:15 PM Medical Record [id number] Patient Account Number: [id number] Date of Birth/Sex: Treating RN: November 20, 1945 (74 y.o. [name] Primary Care Provider: [name] Other Clinician: Referring Provider: Treating Provider/Extender:Stone III, [name], [name] Weeks in Treatment: 4 History of Present Illness HPI Description: 02/23/2019 on evaluation today patient presents for initial evaluation in our clinic concerning issues that she has been having with her bilateral lower extremities as well as her plantar foot on the right. This has been quite some time in regard to the foot ulcer even back as far as the beginning of this year that is 2020. She has had the wounds on her legs a much shorter time but that still has been roughly 2-3 months. Nonetheless she is really not shown signs of a lot of improvement. She tells me that she does have a past medical history of diabetes, venous insufficiency, lymphedema which is mild but nonetheless present, peripheral vascular disease and she has previously been recommended to have a  femoral-popliteal bypass but put that off at that point they are basically just monitoring at this time. She has end-stage renal disease with dependence on renal dialysis, hypertension, and obviously wounds of the bilateral lower extremities at this point. She tells me that she is having some discomfort but fortunately nothing too significant at this point which is good news. No fevers, chills, nausea, vomiting, or diarrhea. She is mainly been leaving these areas open to air as much as possible at this point at 1 point she was trying to keep them covered more but that really did not help either. Fortunately there is no signs of active systemic nor local infection at this time. 03/02/2019 on evaluation today patient appears to be doing well with regard to her wounds. The leg ulcers which were very dry are much more moist at this point and seem to be doing great at this time. Overall I am very pleased with how things seem to be progressing. With regard to her foot ulcer it is showing some signs of slight improvement in my opinion overall there does not appear to be anything worse at least which is also good news. No fevers, chills, nausea, vomiting, or diarrhea. 03/09/2019 on evaluation today patient appears to be doing better with regard to her lower extremity ulcers a lot of the necrotic tissue is loosening up which is good news. In regard to her right plantar foot ulcer this appears to be roughly the same based on what I am seeing currently although there is less callus buildup. Unfortunately she does have an open wound area on the left lateral foot region which unfortunately probes down to bone once this was thoroughly examined. This is something she did not even know was opened she had had a Band-Aid over it just for protection last couple times I saw her. 03/16/19 evaluation today patient appears to  be doing Someone poorly in regard to her left lower extremity. This seems to be likely infected  based on what Im seeing at this point. Theres no signs of active infection at this time systemically but nonetheless theres a lot more erythema of the foot in general unfortunately. Her x-rays were reviewed and did not show any evidence of infection of the right foot. On the left there was a A little bit more going on but no obvious evidence of osteomyelitis though it was mentioned that if that was still a concern that number I would be the best way to further evaluate this. I think they were going to need to proceed with MRI to be honest based on what Im seeing today. 03/23/2019 on evaluation today patient actually appears to be doing worse compared to even last week's visit with regard to her wounds especially the left foot but even the right lower extremity. Both are measuring deeper as far as the overall depth of the wound on the left foot this also seems to have spread as far as the open wound location. It also does appear to me that the cellulitis has spread further up her leg which also has me very concerned at this point. There fortunately is no signs of active infection at this point systemically such as evidence of sepsis but nonetheless I am still concerned about the fact that again the patient does not seem to be turning around. We did have her on IV vancomycin through dialysis which they graciously help this with as well. With that being said it appears based on her culture results that I did review today that cefepime or something of the like would likely be a better option the vancomycin is not likely to help with the Serratia species that was identified. 03/28/19 Fem-Pop 04/06/19 Amp toe 62month follow-up with vascular in april 05/18/2019 on evaluation today patient presents for reevaluation here in the clinic after having been sent to the ER by myself December 2020 due to overall worsening of her condition. She did have a femoral-popliteal bypass performed on 03/28/2019. 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 Subsequently she does have a 24-month follow-up with vascular in April but seems to be doing well from a vascular standpoint at this time. Overall the main issue for which she comes in today she does have a area on the plantar aspect of her right foot where she does have a callus there may or may not be a wound under this region. Subsequently she does have several small ulcerations on the bilateral lower extremities that are more venous stasis in nature. Continue to these do need to be addressed today as well. Fortunately there is no signs of active infection at this time. 05/25/2019 upon evaluation today patient appears to be doing well in regard to her lower extremities. Fortunately there is no signs of active infection at this time. No fever chills noted. She has been tolerating the dressing changes without complication. Fortunately she is doing much better in regard to her plantar foot in fact this appears to be completely healed which is excellent news. 06/01/2019 on evaluation today patient actually appears to be doing very well in regard to her wounds at this point. Fortunately there is no signs of active infection. No fevers, chills, nausea, vomiting, or diarrhea. 06/08/2019 upon evaluation today patient actually appears to be doing great in regard to her right lower extremity  there are no open wounds remaining and she has done extremely well. Unfortunately she has several blistered areas that opened up on the left lower extremity in the interim between last week and this week. She has not been wearing any compression she states that I never told her anything about wearing compression. With that being said this is something that we have discussed in the past and it is something that we wanted her to do and again I did remind her of that today as well. 06/15/2019 upon evaluation today patient appears  to be doing well with regard to her wound she does have some dressing material stuck to the surface of her wounds apparently they dried out and healed fairly quickly causing this to stick. Fortunately we should be able to clean this off but again that is one of the main issues that I see at this point. Fortunately there is no signs of active infection at this time. No fevers, chills, nausea, vomiting, or diarrhea. Electronic Signature(s) Signed: 06/15/2019 3:26:50 PM By: Worthy Keeler PA-C Entered By: Worthy Keeler on 06/15/2019 15:26:49 -------------------------------------------------------------------------------- Physical Exam Details Patient Name: Date of Service: [name], [name] 06/15/2019 2:15 PM Medical Record [id number] Patient Account Number: [id number] Date of Birth/Sex: Treating RN: [birth year] (74 y.o. [name] Primary Care Provider: [name] Other Clinician: Referring Provider: Treating Provider/Extender:Stone III, [name], [name] Weeks in Treatment: 4 Constitutional Well-nourished and well-hydrated in no acute distress. Respiratory normal breathing without difficulty. Psychiatric this patient is able to make decisions and demonstrates good insight into disease process. Alert and Oriented x 3. pleasant and cooperative. Notes Patient's wound bed actually showed signs of good granulation at this time and good epithelization. I was able to remove most of the dried alginate off of the wounds we will get you Xeroform going forward so will not stick hopefully she may even be healed by next week. Overall there is no signs of infection currently. Electronic Signature(s) Signed: 06/15/2019 3:27:17 PM By: Worthy Keeler PA-C Entered By: Worthy Keeler on 06/15/2019 15:27:17 -------------------------------------------------------------------------------- Physician Orders Details Patient Name: Date of Service: [name] 06/15/2019  2:15 PM Medical Record [id number] Patient Account Number: [id number] Date of Birth/Sex: Treating RN: 02/10/1946 (73 y.o. [name] Primary Care Provider: [name] Other Clinician: Referring Provider: Treating Provider/Extender:Stone III, [name], [name] Weeks in Treatment: 4 Verbal / Phone Orders: No Diagnosis Coding ICD-10 Coding Code Description I87.2 Venous insufficiency (chronic) (peripheral) I89.0 Lymphedema, not elsewhere classified L97.812 Non-pressure chronic ulcer of other part of right lower leg with fat layer exposed L97.822 Non-pressure chronic ulcer of other part of left lower leg with fat layer exposed E11.621 Type 2 diabetes mellitus with foot ulcer L97.512 Non-pressure chronic ulcer of 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d I73.89 Other specified peripheral vascular diseases N18.6 End stage renal disease Z99.2 Dependence on renal dialysis I10 Essential (primary) hypertension Follow-up Appointments Return Appointment in 1 week. Dressing Change Frequency Do not change entire dressing for one week. - both legs Skin Barriers/Peri-Wound Care Moisturizing lotion - both legs Wound Cleansing May shower with protection. - left leg Primary Wound Dressing Wound #12 Left,Proximal,Anterior Lower Leg Xeroform Wound #15 Left,Posterior Lower Leg Xeroform Secondary Dressing Wound #12 Left,Proximal,Anterior Lower Leg ABD pad Wound #15 Left,Posterior Lower Leg ABD pad Edema Control Kerlix and Coban - Bilateral Avoid standing for long periods of time Elevate legs to the level of the heart or above for 30  minutes daily and/or when sitting, a frequency of: Exercise regularly Support Garment 20-30 mm/Hg pressure to: - bring compression stockings to appointment next week Off-Loading Other: - foam donut right plantar callous [place] [facility] for wound care. - [facility] care  this week Electronic Signature(s) Signed: 06/15/2019 5:29:37 PM By: Worthy Keeler PA-C Signed: 06/15/2019 5:43:44 PM By: Baruch Gouty RN, BSN Entered By: Baruch Gouty on 06/15/2019 15:22:41 -------------------------------------------------------------------------------- Problem List Details Patient Name: Date of Service: [name] 06/15/2019 2:15 PM Medical Record [id number] Patient Account Number: [id number] Date of Birth/Sex: Treating RN: 1946-03-02 (74 y.o. [name], Linda Primary Care Provider: [name] Other Clinician: Referring Provider: Treating Provider/Extender:Stone III, [name], Abelino Derrick in Treatment: 4 Active Problems ICD-10 Evaluated Encounter Code Description Active Date Today Diagnosis I87.2 Venous insufficiency (chronic) (peripheral) 05/18/2019 No Yes I89.0 Lymphedema, not elsewhere classified 05/18/2019 No Yes L97.812 Non-pressure chronic ulcer of other part of right lower 05/18/2019 No Yes leg with fat layer exposed L97.822 Non-pressure chronic ulcer of other part of left lower 05/18/2019 No Yes leg with fat layer exposed E11.621 Type 2 diabetes mellitus with foot ulcer 05/18/2019 No Yes L97.512 Non-pressure chronic ulcer of other part of right foot 05/18/2019 No Yes with fat layer exposed I73.89 Other specified peripheral vascular diseases 05/18/2019 No Yes N18.6 End stage renal disease 05/18/2019 No Yes Z99.2 Dependence on renal dialysis 05/18/2019 No Yes I10 Essential (primary) hypertension 05/18/2019 No Yes Inactive Problems Resolved Problems Electronic Signature(s) Signed: 06/15/2019 3:05:23 PM By: Worthy Keeler PA-C Entered By: Worthy Keeler on 06/15/2019 15:05:22 -------------------------------------------------------------------------------- Progress Note Details Patient Name: Date of Service: [name] 06/15/2019 2:15 PM Medical Record [id number] Patient Account Number: [id number] Date of  Birth/Sex: Treating RN: 20-Nov-1945 (74 y.o. [name] Primary Care Provider: [name] Other Clinician: Referring Provider: Treating Provider/Extender:Stone III, [name], [name] Weeks in Treatment: 4 Subjective Chief Complaint Information obtained from Patient Bilateral LE ulcers and Right plantar foot ulcer History of Present Illness (HPI) 02/23/2019 on evaluation today patient presents for initial evaluation in our clinic concerning issues that she has been having with her bilateral lower extremities as well as her plantar foot on the right. This has been quite some time in regard to the foot ulcer even back as far as the beginning of this year that is 2020. She has had the wounds on her legs a much shorter time but that still has been roughly 2-3 months. Nonetheless she is really not shown signs of a lot of improvement. She tells me that she does have a past medical history of diabetes, venous insufficiency, lymphedema which is mild but nonetheless present, peripheral vascular disease and she has previously been recommended to have a femoral-popliteal bypass but put that off at that point they are basically just monitoring at this time. She has end-stage renal disease with dependence on renal dialysis, hypertension, and obviously wounds of the bilateral lower extremities at this point. She tells me that she is having some discomfort but fortunately nothing too significant at this point which is good news. No fevers, chills, nausea, vomiting, or diarrhea. She is mainly been leaving these areas open to air as much as possible at this point at 1 point she was trying to keep them covered more but that really did not help either. Fortunately there is no signs of active systemic nor local infection at this time. 03/02/2019 on evaluation today patient  appears to be doing well with regard to her wounds. The leg ulcers which were very dry are much more moist at this  point and seem to be doing great at this time. Overall I am very pleased with how things seem to be progressing. With regard to her foot ulcer it is showing some signs of slight improvement in my opinion overall there does not appear to be anything worse at least which is also good news. No fevers, chills, nausea, vomiting, or diarrhea. 03/09/2019 on evaluation today patient appears to be doing better with regard to her lower extremity ulcers a lot of the necrotic tissue is loosening up which is good news. In regard to her right plantar foot ulcer this appears to be roughly the same based on what I am seeing currently although there is less callus buildup. Unfortunately she does have an open wound area on the left lateral foot region which unfortunately probes down to bone once this was thoroughly examined. This is something she did not even know was opened she had had a Band-Aid over it just for protection last couple times I saw her. 03/16/19 evaluation today patient appears to be doing Someone poorly in regard to her left lower extremity. This seems to be likely infected based on what Ioom seeing at this point. Thereoos no signs of active infection at this time systemically but nonetheless thereoos a lot more erythema of the foot in general unfortunately. Her x-rays were reviewed and did not show any evidence of infection of the right foot. On the left there was a A little bit more going on but no obvious evidence of osteomyelitis though it was mentioned that if that was still a concern that number I would be the best way to further evaluate this. I think they were going to need to proceed with MRI to be honest based on what Ioom seeing today. 03/23/2019 on evaluation today patient actually appears to be doing worse compared to even last week's visit with regard to her wounds especially the left foot but even the right lower extremity. Both are measuring deeper as far as the overall depth of  the wound on the left foot this also seems to have spread as far as the open wound location. It also does appear to me that the cellulitis has spread further up her leg which also has me very concerned at this point. There fortunately is no signs of active infection at this point systemically such as evidence of sepsis but nonetheless I am still concerned about the fact that again the patient does not seem to be turning around. We did have her on IV vancomycin through dialysis which they graciously help this with as well. With that being said it appears based on her culture results that I did review today that cefepime or something of the like would likely be a better option the vancomycin is not likely to help with the Serratia species that was identified. 03/28/19 Fem-Pop 04/06/19 Amp toe 71month follow-up with vascular in april 05/18/2019 on evaluation today patient presents for reevaluation here in the clinic after having been sent to the ER by myself December 2020 due to overall worsening of her condition. She did have a femoral-popliteal bypass performed on 03/28/2019. 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 Subsequently she does have a 37-month follow-up with vascular in April but seems to be doing well from a vascular standpoint at  this time. Overall the main issue for which she comes in today she does have a area on the plantar aspect of her right foot where she does have a callus there may or may not be a wound under this region. Subsequently she does have several small ulcerations on the bilateral lower extremities that are more venous stasis in nature. Continue to these do need to be addressed today as well. Fortunately there is no signs of active infection at this time. 05/25/2019 upon evaluation today patient appears to be doing well in regard to her lower extremities. Fortunately there is no signs of active infection at  this time. No fever chills noted. She has been tolerating the dressing changes without complication. Fortunately she is doing much better in regard to her plantar foot in fact this appears to be completely healed which is excellent news. 06/01/2019 on evaluation today patient actually appears to be doing very well in regard to her wounds at this point. Fortunately there is no signs of active infection. No fevers, chills, nausea, vomiting, or diarrhea. 06/08/2019 upon evaluation today patient actually appears to be doing great in regard to her right lower extremity there are no open wounds remaining and she has done extremely well. Unfortunately she has several blistered areas that opened up on the left lower extremity in the interim between last week and this week. She has not been wearing any compression she states that I never told her anything about wearing compression. With that being said this is something that we have discussed in the past and it is something that we wanted her to do and again I did remind her of that today as well. 06/15/2019 upon evaluation today patient appears to be doing well with regard to her wound she does have some dressing material stuck to the surface of her wounds apparently they dried out and healed fairly quickly causing this to stick. Fortunately we should be able to clean this off but again that is one of the main issues that I see at this point. Fortunately there is no signs of active infection at this time. No fevers, chills, nausea, vomiting, or diarrhea. Objective Constitutional Well-nourished and well-hydrated in no acute distress. Vitals Time Taken: 2:47 PM, Height: 64 in, Weight: 160 lbs, BMI: 27.5, Temperature: 98.1 F, Pulse: 90 bpm, Respiratory Rate: 18 breaths/min, Blood Pressure: 167/57 mmHg. Respiratory normal breathing without difficulty. Psychiatric this patient is able to make decisions and demonstrates good insight into disease process.  Alert and Oriented x 3. pleasant and cooperative. General Notes: Patient's wound bed actually showed signs of good granulation at this time and good epithelization. I was able to remove most of the dried alginate off of the wounds we will get you Xeroform going forward so will not stick hopefully she may even be healed by next week. Overall there is no signs of infection currently. Integumentary (Hair, Skin) Wound #12 status is Open. Original cause of wound was Blister. The wound is located on the Left,Proximal,Anterior Lower Leg. The wound measures 0.5cm length x 0.5cm width x 0.1cm depth; 0.196cm^2 area and 0.02cm^3 volume. There is Fat Layer (Subcutaneous Tissue) Exposed exposed. There is no tunneling or undermining noted. There is a medium amount of serosanguineous drainage noted. The wound margin is distinct with the outline attached to the wound base. There is large (67-100%) red granulation within the wound bed. There is a small (1- 33%) amount of necrotic tissue within the wound bed including Adherent Slough. Wound #13  status is Healed - Epithelialized. Original cause of wound was Blister. The wound is located on the Left,Medial Lower Leg. The wound measures 0cm length x 0cm width x 0cm depth; 0cm^2 area and 0cm^3 volume. Wound #14 status is Healed - Epithelialized. Original cause of wound was Blister. The wound is located on the Left,Anterior Lower Leg. The wound measures 0cm length x 0cm width x 0cm depth; 0cm^2 area and 0cm^3 volume. Wound #15 status is Open. Original cause of wound was Blister. The wound is located on the Left,Posterior Lower Leg. The wound measures 4.1cm length x 3.1cm width x 0.1cm depth; 9.982cm^2 area and 0.998cm^3 volume. There is Fat Layer (Subcutaneous Tissue) Exposed exposed. There is no tunneling or undermining noted. There is a medium amount of serosanguineous drainage noted. The wound margin is distinct with the outline attached to the wound base. There is  medium (34-66%) red, pink granulation within the wound bed. There is a medium (34-66%) amount of necrotic tissue within the wound bed including Adherent Slough. 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 Dressing Change Frequency: Do not change entire dressing for one week. - both legs Skin Barriers/Peri-Wound Care: Moisturizing lotion - both legs Wound Cleansing: May shower with protection. - left leg Primary Wound Dressing: Wound #12 Left,Proximal,Anterior Lower Leg: Xeroform Wound #15 Left,Posterior Lower Leg: Xeroform Secondary Dressing: Wound #12 Left,Proximal,Anterior Lower Leg: ABD pad Wound #15 Left,Posterior Lower Leg: ABD pad Edema Control: Kerlix and Coban - Bilateral Avoid standing for long periods of time Elevate legs to the level of the heart or above for 30 minutes daily and/or when sitting, a frequency of: Exercise regularly Support Garment 20-30 mm/Hg pressure to: - bring compression stockings to appointment next week Off-Loading: Other: - foam donut right plantar callous Home Health: [facility]. - [facility] wound care this week 1. I would recommend that we switch to Xeroform discontinuing the alginate which got to stuck to the wound bed. 2. I am in a suggest as well the patient continue to elevate her legs much as possible were also going to continue with the  compression wraps this week although we should be ready to transition into the 20-30 mmHg support garments as early as next week if she has those unfortunately she could not find her current compression stockings. 3. I am also going to recommend that she continue to monitor for any signs of worsening pain that would indicate infection if anything this occurs she will contact the office let me know. We will see patient back for reevaluation in 1 week here in the clinic. If anything worsens or changes patient will contact our office for additional recommendations. Electronic Signature(s) Signed: 06/15/2019 3:27:57 PM By: Worthy Keeler PA-C Entered By: Worthy Keeler on 06/15/2019 15:27:56 -------------------------------------------------------------------------------- SuperBill Details Patient Name: Date of Service: [name] 06/15/2019 Medical Record [id number] Patient Account Number: [id number] Date of Birth/Sex: Treating RN: 1945-12-16 (74 y.o. 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812 Non-pressure chronic ulcer of other part of right lower  L97.822 Non-pressure chronic ulcer of other part of left lower l Modifier: leg with fat la eg with fat lay Quantity: 1 yer exposed er exposed Electronic Signature(s) Signed: 06/15/2019 3:28:15 PM By: Worthy Keeler PA-C Entered By: Worthy Keeler on 06/15/2019 15:28:14

## 2019-06-15 NOTE — Patient Instructions (Addendum)
  [name] , Thank you for taking time to come for your Medicare Wellness Visit. I appreciate your ongoing commitment to your health goals. Please review the following plan we discussed and let me know if I can assist you in the future.   This is a list of the screening recommended for you and due dates:  Health Maintenance  Topic Date Due  .  Hepatitis C: One time screening is recommended by Center for Disease Control  (CDC) for  adults born from [birth year] through 1965.   24-Nov-1945  . Eye exam for diabetics  10/18/2016  . Mammogram  01/21/2019  . Hemoglobin A1C  11/24/2019  . Complete foot exam   01/09/2020  . Colon Cancer Screening  03/08/2020  . Tetanus Vaccine  01/12/2022  . Flu Shot  Completed  . DEXA scan (bone density measurement)  Completed  . Pneumonia vaccines  Completed     Please review advanced directives and living will, update if needed and please bring [name] your most up to date version.   Please schedule mammogram ASAP   HOW TO SCHEDULE A MAMMOGRAM  [facility]  7 a.m.-6:30 p.m., Monday 7 a.m.-5 p.m., Tuesday-Friday Schedule an appointment by calling [phone].  [facility] Schedule an appointment by calling [phone].    Please schedule a diabetic eye exam with Dr. Bing Plume  [phone]

## 2019-06-15 NOTE — Progress Notes (Addendum)
[name], [name] ([id number]) Visit Report for 06/15/2019 Arrival Information Details Patient Name: Date of Service: [name], [name] 06/15/2019 2:15 PM Medical Record [id number] Patient Account Number: [id number] Date of Birth/Sex: Treating RN: [birth year] (74 y.o. [name], Meta.Reding Primary Care Khara Renaud: [name] Other Clinician: Referring Mirissa Lopresti: Treating Samra Pesch/Extender:Stone III, Lemar Livings, Abelino Derrick in Treatment: 4 Visit Information History Since Last Visit Added or deleted any medications: No Patient Arrived: [name] Any new allergies or adverse reactions: No Arrival Time: 14:45 Had a fall or experienced change in No Accompanied By: self activities of daily living that may affect Transfer Assistance: None risk of falls: Patient Identification Verified: Yes Signs or symptoms of abuse/neglect since last No Secondary Verification Process Yes visito Completed: Hospitalized since last visit: No Patient Requires Transmission- No Implantable device outside of the clinic excluding No Based Precautions: cellular tissue based products placed in the center Patient Has Alerts: Yes since last visit: Patient Alerts: R ABI non Has Dressing in Place as Prescribed: Yes compressible Pain Present Now: No L ABI non compressible Electronic Signature(s) Signed: 06/15/2019 5:34:34 PM By: Deon Pilling Entered By: Deon Pilling on 06/15/2019 14:46:23 -------------------------------------------------------------------------------- Clinic Level of Care Assessment Details Patient Name: Date of Service: [name], [name] 06/15/2019 2:15 PM Medical Record [id number] Patient Account Number: [id number] Date of Birth/Sex: Treating RN: 15-Apr-1945 (74 y.o. [name] Primary Care Jaymari Cromie: [name] Other Clinician: Referring Mikeila Burgen: Treating Shakyia Bosso/Extender:Stone III, Lemar Livings, Abelino Derrick in Treatment: 4 Clinic Level of Care  Assessment Items TOOL 4 Quantity Score _0  - Use when only an EandM is performed on FOLLOW-UP visit 0 ASSESSMENTS - Nursing Assessment / Reassessment X - Reassessment of Co-morbidities (includes updates in patient status) 1 10 X - Reassessment of Adherence to Treatment Plan 1 5 ASSESSMENTS - Wound and Skin Assessment / Reassessment _1  - Simple Wound Assessment / Reassessment - one wound 0 X - Complex Wound Assessment / Reassessment - multiple wounds 2 5 _2  - Dermatologic / Skin Assessment (not related to wound area) 0 ASSESSMENTS - Focused Assessment X - Circumferential Edema Measurements - multi extremities 2 5 _3  - Nutritional Assessment / Counseling / Intervention 0 X - Lower Extremity Assessment (monofilament, tuning fork, pulses) 1 5 _4  - Peripheral Arterial Disease Assessment (using hand held doppler) 0 ASSESSMENTS - Ostomy and/or Continence Assessment and Care _5  - Incontinence Assessment and Management 0 _6  - Ostomy Care Assessment and Management (repouching, etc.) 0 PROCESS - Coordination of Care X - Simple Patient / Family Education for ongoing care 1 15 _7  - Complex (extensive) Patient / Family Education for ongoing care 0 X - Staff obtains Programmer, systems, Records, Test Results / Process Orders 1 10 X - Staff telephones HHA, Nursing Homes / Clarify orders / etc 1 10 _8  - Routine Transfer to another Facility (non-emergent condition) 0 _9  - Routine Hospital Admission (non-emergent condition) 0 _10  - New Admissions / Biomedical engineer / Ordering NPWT, Apligraf, etc. 0 _11  - Emergency Hospital Admission (emergent condition) 0 X - Simple Discharge Coordination 1 10 _12  - Complex (extensive) Discharge Coordination 0 PROCESS - Special Needs _13  - Pediatric / Minor Patient Management 0 _14  - Isolation Patient Management 0 _15  - Hearing / Language / Visual special needs 0 _16  - Assessment of Community assistance (transportation, D/C planning, etc.) 0 _17  - Additional assistance / Altered  mentation 0 _18  - Support Surface(s) Assessment (bed, cushion, seat, etc.) 0 INTERVENTIONS - Wound Cleansing / Measurement _19  - Simple Wound Cleansing -  one wound 0 X - Complex Wound Cleansing - multiple wounds 2 5 X - Wound Imaging (photographs - any number of wounds) 1 5 _0  - Wound Tracing (instead of photographs) 0 _1  - Simple Wound Measurement - one wound 0 X - Complex Wound Measurement - multiple wounds 2 5 INTERVENTIONS - Wound Dressings X - Small Wound Dressing one or multiple wounds 2 10 _2  - Medium Wound Dressing one or multiple wounds 0 _3  - Large Wound Dressing one or multiple wounds 0 X - Application of Medications - topical 1 5 <[id number]>_9<\/[id number]>_1  - Application of Medications - injection 0 INTERVENTIONS - Miscellaneous _5  - External ear exam 0 _6  - Specimen Collection (cultures, biopsies, blood, body fluids, etc.) 0 _7  - Specimen(s) / Culture(s) sent or taken to Lab for analysis 0 _8  - Patient Transfer (multiple staff / Civil Service fast streamer / Similar devices) 0 _9  - Simple Staple / Suture removal (25 or less) 0 _10  - Complex Staple / Suture removal (26 or more) 0 _11  - Hypo / Hyperglycemic Management (close monitor of Blood Glucose) 0 _12  - Ankle / Brachial Index (ABI) - do not check if billed separately 0 X - Vital Signs 1 5 Has the patient been seen at the hospital within the last three years: Yes Total Score: 140 Level Of Care: New/Established - Level 4 Electronic Signature(s) Signed: 06/15/2019 5:43:44 PM By: Baruch Gouty RN, BSN Entered By: Baruch Gouty on 06/15/2019 15:24:41 -------------------------------------------------------------------------------- Encounter Discharge Information Details Patient Name: Date of Service: [name] 06/15/2019 2:15 PM Medical Record [id number] Patient Account Number: [id number] Date of Birth/Sex: Treating RN: 08/27/1945 (74 y.o. [name] Primary Care Makisha Marrin: [name] Other Clinician: Referring Nakul Avino: Treating  Candiss Galeana/Extender:Stone III, Lemar Livings, Abelino Derrick in Treatment: 4 Encounter Discharge Information Items Discharge Condition: Stable Ambulatory Status: Cane Discharge Destination: Home Transportation: Private Auto Accompanied By: self Schedule Follow-up Appointment: Yes Clinical Summary of Care: Patient Declined Electronic Signature(s) Signed: 06/15/2019 5:35:23 PM By: Carlene Coria RN Entered By: Carlene Coria on 06/15/2019 15:54:07 -------------------------------------------------------------------------------- Lower Extremity Assessment Details Patient Name: Date of Service: [name], [name] 06/15/2019 2:15 PM Medical Record [id number] Patient Account Number: [id number] Date of Birth/Sex: Treating RN: 03/01/1946 (74 y.o. [name], Meta.Reding Primary Care Gerren Hoffmeier: [name] Other Clinician: Referring Ervan Heber: Treating Latonia Conrow/Extender:Stone III, Lemar Livings, Lendon Ka Weeks in Treatment: 4 Edema Assessment Assessed: [Left: No] [Right: No] Edema: [Left: Yes] [Right: Yes] Calf Left: Right: Point of Measurement: 31 cm From Medial Instep 31 cm 27 cm Ankle Left: Right: Point of Measurement: 11 cm From Medial Instep 21 cm 18 cm Electronic Signature(s) Signed: 06/15/2019 5:34:34 PM By: Deon Pilling Entered By: Deon Pilling on 06/15/2019 14:52:42 -------------------------------------------------------------------------------- [place] Details Patient Name: Date of Service: [name] 06/15/2019 2:15 PM Medical Record [id number] Patient Account Number: [id number] Date of Birth/Sex: Treating RN: 23-Oct-1945 (74 y.o. [name] Primary Care Jaqulyn Chancellor: [name] Other Clinician: Referring Starlene Consuegra: Treating Starnisha Batrez/Extender:Stone III, Lemar Livings, Abelino Derrick in Treatment: 4 Active Inactive Nutrition Nursing Diagnoses: Impaired glucose control: actual or potential Potential for alteratiion in  Nutrition/Potential for imbalanced nutrition Goals: Patient/caregiver will maintain therapeutic glucose control Date Initiated: 05/18/2019 Target Resolution Date: 07/13/2019 Goal Status: Active Interventions: Assess HgA1c results as ordered upon admission and as needed Assess patient nutrition upon admission and as needed per policy Treatment Activities: Patient referred to Primary Care Physician for further nutritional evaluation : 05/18/2019 Notes: Wound/Skin Impairment Nursing Diagnoses: Impaired tissue integrity Knowledge deficit related  to ulceration/compromised skin integrity Goals: Patient/caregiver will verbalize understanding of skin care regimen Date Initiated: 05/18/2019 Target Resolution Date: 07/13/2019 Goal Status: Active Ulcer/skin breakdown will have a volume reduction of 30% by week 4 Date Initiated: 05/18/2019 Date Inactivated: 06/15/2019 Target Resolution Date: 06/15/2019 Goal Status: Met Ulcer/skin breakdown will have a volume reduction of 50% by week 8 Date Initiated: 06/15/2019 Target Resolution Date: 07/13/2019 Goal Status: Active Interventions: Assess patient/caregiver ability to obtain necessary supplies Assess patient/caregiver ability to perform ulcer/skin care regimen upon admission and as needed Assess ulceration(s) every visit Provide education on ulcer and skin care Treatment Activities: Skin care regimen initiated : 05/18/2019 Topical wound management initiated : 05/18/2019 Notes: Electronic Signature(s) Signed: 06/15/2019 5:43:44 PM By: Baruch Gouty RN, BSN Entered By: Baruch Gouty on 06/15/2019 15:12:37 -------------------------------------------------------------------------------- Pain Assessment Details Patient Name: Date of Service: [name] 06/15/2019 2:15 PM Medical Record [id number] Patient Account Number: [id number] Date of Birth/Sex: Treating RN: [birth year] (74 y.o. Debby Bud Primary Care Spenser Harren: [name] Other Clinician: Referring Daelyn Pettaway: Treating Trissa Molina/Extender:Stone III, Lemar Livings, Lendon Ka Weeks in Treatment: 4 Active Problems Location of Pain Severity and Description of Pain Patient Has Paino No Site Locations Rate the pain. Current Pain Level: 0 Pain Management and Medication Current Pain Management: Medication: No Cold Application: No Rest: No Massage: No Activity: No T.E.N.S.: No Heat Application: No Leg drop or elevation: No Is the Current Pain Management Adequate: Adequate How does your wound impact your activities of daily livingo Sleep: No Bathing: No Appetite: No Relationship With Others: No Bladder Continence: No Emotions: No Bowel Continence: No Work: No Toileting: No Drive: No Dressing: No Hobbies: No Electronic Signature(s) Signed: 06/15/2019 5:34:34 PM By: Deon Pilling Entered By: Deon Pilling on 06/15/2019 14:46:35 -------------------------------------------------------------------------------- Patient/Caregiver Education Details Patient Name: Date of Service: [name] 3/10/2021andnbsp2:15 PM Medical Record [id number] Patient Account Number: [id number] Date of Birth/Gender: Treating RN: [birth year] (74 y.o. [name] Primary Care Physician: [name] Other Clinician: Referring Physician: Treating Physician/Extender:Stone III, Lemar Livings, Abelino Derrick in Treatment: 4 Education Assessment Education Provided To: Patient Education Topics Provided Venous: Methods: Explain/Verbal Responses: Reinforcements needed, State content correctly Wound/Skin Impairment: Methods: Explain/Verbal Responses: Reinforcements needed, State content correctly Electronic Signature(s) Signed: 06/15/2019 5:43:44 PM By: Baruch Gouty RN, BSN Entered By: Baruch Gouty on 06/15/2019 15:13:17 -------------------------------------------------------------------------------- Wound Assessment Details Patient Name: Date  of Service: [name] 06/15/2019 2:15 PM Medical Record [id number] Patient Account Number: [id number] Date of Birth/Sex: Treating RN: April 28, 1945 (74 y.o. [name], Linda Primary Care Krisi Azua: [name] Other Clinician: Referring Markiyah Gahm: Treating Brenlyn Beshara/Extender:Stone III, Lemar Livings, Lendon Ka Weeks in Treatment: 4 Wound Status Wound Number: 12 Primary Diabetic Wound/Ulcer of the Lower Extremity Etiology: Wound Location: Left, Proximal, Anterior Lower Leg Wound Open Wounding Event: Blister Status: Date Acquired: 06/08/2019 Comorbid Cataracts, Anemia, Asthma, Congestive Heart Weeks Of Treatment: 1 History: Failure, Coronary Artery Disease, Clustered Wound: No Hypertension, Peripheral Arterial Disease, Peripheral Venous Disease, Type II Diabetes, End Stage Renal Disease, Osteoarthritis, Osteomyelitis, Neuropathy, Received Radiation Photos Wound Measurements Length: (cm) 0.5 % Reduct Width: (cm) 0.5 % Reduct Depth: (cm) 0.1 Epitheli Area: (cm) 0.196 Tunneli Volume: (cm) 0.02 Undermi Wound Description Classification: Grade 2 Wound Margin: Distinct, outline attached Exudate Amount: Medium Exudate Type: Serosanguineous Exudate Color: red, brown Wound Bed Granulation Amount: Large (67-100%) Granulation Quality: Red Necrotic Amount: Small (1-33%) Necrotic Quality: Adherent Slough Foul Odor After Cleansing: No Slough/Fibrino Yes Exposed Structure Fascia Exposed: No Fat Layer (Subcutaneous Tissue)  Exposed: Yes Tendon Exposed: No Muscle Exposed: No Joint Exposed: No Bone Exposed: No ion in Area: 98.3% ion in Volume: 98.2% alization: Large (67-100%) ng: No ning: No Electronic Signature(s) Signed: 06/21/2019 4:45:30 PM By: Mikeal Hawthorne EMT/HBOT Signed: 06/21/2019 5:21:02 PM By: Baruch Gouty RN, BSN Previous Signature: 06/15/2019 5:34:34 PM Version By: Deon Pilling Entered By: Mikeal Hawthorne on 06/21/2019  15:54:54 -------------------------------------------------------------------------------- Wound Assessment Details Patient Name: Date of Service: [name] 06/15/2019 2:15 PM Medical Record [id number] Patient Account Number: [id number] Date of Birth/Sex: Treating RN: 31-Jan-1946 (74 y.o. [name], Meta.Reding Primary Care Yelena Metzer: [name] Other Clinician: Referring Ragen Laver: Treating Arieh Bogue/Extender:Stone III, Lemar Livings, Lendon Ka Weeks in Treatment: 4 Wound Status Wound Number: 13 Primary Diabetic Wound/Ulcer of the Lower Extremity Etiology: Wound Location: Left Lower Leg - Medial Wound Healed - Epithelialized Wounding Event: Blister Status: Date Acquired: 06/08/2019 Comorbid Cataracts, Anemia, Asthma, Congestive Heart Weeks Of Treatment: 1 History: Failure, Coronary Artery Disease, Clustered Wound: No Hypertension, Peripheral Arterial Disease, Peripheral Venous Disease, Type II Diabetes, End Stage Renal Disease, Osteoarthritis, Osteomyelitis, Neuropathy, Received Radiation Photos Wound Measurements Length: (cm) 0 % Reducti Width: (cm) 0 % Reducti Depth: (cm) 0 Epithelia Area: (cm) 0 Volume: (cm) 0 Wound Description Classification: Grade 2 Foul Odo Wound Margin: Distinct, outline attached Slough/F Exudate Amount: Medium Exudate Type: Serosanguineous Exudate Color: red, brown Wound Bed Granulation Amount: Medium (34-66%) Granulation Quality: Red, Pink Fascia Ex Necrotic Amount: Medium (34-66%) Fat Layer Necrotic Quality: Adherent Slough Tendon Ex Muscle Ex Joint Exp Bone Expo Electronic Signature(s) Signed: 06/21/2019 4:45:30 PM By: Mikeal Hawthorne EMT/HBOT Signed: 06/23/2019 4:55:35 PM By: Deon Pilling Previous Signature: 06/15/2019 5:34:34 PM Version By: Lady Deutscher Entered By: Mikeal Hawthorne on 03/16 r After Cleansing: No ibrino Yes Exposed Structure posed: No (Subcutaneous Tissue) Exposed: Yes posed: No posed: No osed: No sed:  No i /2021 15:54:14 on in Area: 100% on in Volume: 100% lization: Small (1-33%) -------------------------------------------------------------------------------- Wound Assessment Details Patient Name: Date of Service: [name], [name] 06/15/2019 2:15 PM Medical Record [id number] Patient Account Number: [id number] Date of Birth/Sex: Treating RN: 1946/03/09 (74 y.o. [name], Meta.Reding Primary Care Hendrik Donath: [name] Other Clinician: Referring Abagale Boulos: Treating Courtlynn Holloman/Extender:Stone III, Lemar Livings, Lendon Ka Weeks in Treatment: 4 Wound Status Wound Number: 14 Primary Diabetic Wound/Ulcer of the Lower Extremity Etiology: Wound Location: Left Lower Leg - Anterior Wound Healed - Epithelialized Wounding Event: Blister Status: Date Acquired: 06/08/2019 Comorbid Cataracts, Anemia, Asthma, Congestive Heart Weeks Of Treatment: 1 History: Failure, Coronary Artery Disease, Clustered Wound: No Hypertension, Peripheral Arterial Disease, Peripheral Venous Disease, Type II Diabetes, End Stage Renal Disease, Osteoarthritis, Osteomyelitis, Neuropathy, Received Radiation Photos Wound Measurements Length: (cm) 0 % Reducti Width: (cm) 0 % Reducti Depth: (cm) 0 Area: (cm) 0 Volume: (cm) 0 Wound Description Classification: Grade 2 Foul Odo Wound Margin: Distinct, outline attached Slough/F Exudate Amount: Medium Exudate Type: Serosanguineous Exudate Color: red, brown Wound Bed Granulation Amount: Medium (34-66%) Granulation Quality: Red, Pink Fascia Exp Necrotic Amount: Medium (34-66%) Fat Layer Necrotic Quality: Adherent Slough Tendon Exp Muscle Exp Joint Expo Bone Expos r After Cleansing: No ibrino Yes Exposed Structure osed: No (Subcutaneous Tissue) Exposed: Yes osed: No osed: No sed: No ed: No on in Area: 100% on in Volume: 100% Electronic Signature(s) Signed: 06/21/2019 4:45:30 PM By: Mikeal Hawthorne EMT/HBOT Signed: 06/23/2019 4:55:35 PM By: Deon Pilling Previous Signature: 06/15/2019 5:34:34 PM Version By: Deon Pilling Entered By: Mikeal Hawthorne on 06/21/2019 15:52:39 -------------------------------------------------------------------------------- Wound Assessment Details Patient Name: Date  of Service: [name], [name] 06/15/2019 2:15 PM Medical Record [id number] Patient Account Number: [id number] Date of Birth/Sex: Treating RN: [birth year] (74 y.o. [name], Linda Primary Care Jaxan Michel: [name] Other Clinician: Referring Brodie Scovell: Treating Court Gracia/Extender:Stone III, Lemar Livings, Lendon Ka Weeks in Treatment: 4 Wound Status Wound Number: 15 Primary Diabetic Wound/Ulcer of the Lower Extremity Etiology: Wound Location: Left Lower Leg - Posterior Wound Open Wounding Event: Blister Status: Date Acquired: 06/08/2019 Comorbid Cataracts, Anemia, Asthma, Congestive Heart Weeks Of Treatment: 1 History: Failure, Coronary Artery Disease, Clustered Wound: No Hypertension, Peripheral Arterial Disease, Peripheral Venous Disease, Type II Diabetes, End Stage Renal Disease, Osteoarthritis, Osteomyelitis, Neuropathy, Received Radiation Photos Wound Measurements Length: (cm) 4.1 Width: (cm) 3.1 Depth: (cm) 0.1 Area: (cm) 9.982 Volume: (cm) 0.998 Wound Description Classification: Grade 2 Wound Margin: Distinct, outline attached Exudate Amount: Medium Exudate Type: Serosanguineous Exudate Color: red, brown Wound Bed Granulation Amount: Medium (34-66%) Granulation Quality: Red, Pink Necrotic Amount: Medium (34-66%) Necrotic Quality: Adherent Slough After Cleansing: No brino Yes Exposed Structure osed: No (Subcutaneous Tissue) Exposed: Yes osed: No osed: No sed: No ed: No % Reduction in Area: -41.2% % Reduction in Volume: -41.2% Epithelialization: Small (1-33%) Tunneling: No Undermining: No Foul Odor Slough/Fi Fascia Exp Fat Layer Tendon Exp Muscle Exp Joint Expo Bone Expos Electronic  Signature(s) Signed: 06/21/2019 4:45:30 PM By: Mikeal Hawthorne EMT/HBOT Signed: 06/21/2019 5:21:02 PM By: Baruch Gouty RN, BSN Previous Signature: 06/15/2019 5:34:34 PM Version By: Deon Pilling Entered By: Mikeal Hawthorne on 06/21/2019 15:55:14 -------------------------------------------------------------------------------- Vitals Details Patient Name: Date of Service: [name] 06/15/2019 2:15 PM Medical Record [id number] Patient Account Number: [id number] Date of Birth/Sex: Treating RN: 1946/01/13 (74 y.o. [name], Meta.Reding Primary Care Jaimarie Rapozo: [name] Other Clinician: Referring Bayani Renteria: Treating Kingstin Heims/Extender:Stone III, Lemar Livings, Lendon Ka Weeks in Treatment: 4 Vital Signs Time Taken: 14:47 Temperature (F): 98.1 Height (in): 64 Pulse (bpm): 90 Weight (lbs): 160 Respiratory Rate (breaths/min): 18 Body Mass Index (BMI): 27.5 Blood Pressure (mmHg): 167/57 Reference Range: 80 - 120 mg / dl Electronic Signature(s) Signed: 06/15/2019 5:34:34 PM By: Deon Pilling Entered By: Deon Pilling on 06/15/2019 14:48:09

## 2019-06-16 DIAGNOSIS — D631 Anemia in chronic kidney disease: Secondary | ICD-10-CM | POA: Diagnosis not present

## 2019-06-16 DIAGNOSIS — Z992 Dependence on renal dialysis: Secondary | ICD-10-CM | POA: Diagnosis not present

## 2019-06-16 DIAGNOSIS — N186 End stage renal disease: Secondary | ICD-10-CM | POA: Diagnosis not present

## 2019-06-16 DIAGNOSIS — N2581 Secondary hyperparathyroidism of renal origin: Secondary | ICD-10-CM | POA: Diagnosis not present

## 2019-06-16 DIAGNOSIS — D509 Iron deficiency anemia, unspecified: Secondary | ICD-10-CM | POA: Diagnosis not present

## 2019-06-16 LAB — CBC WITH DIFFERENTIAL/PLATELET
Absolute Monocytes: 950 cells/uL (ref 200–950)
Basophils Absolute: 18 cells/uL (ref 0–200)
Basophils Relative: 0.2 %
Eosinophils Absolute: 167 cells/uL (ref 15–500)
Eosinophils Relative: 1.9 %
HCT: 38.5 % (ref 35.0–45.0)
Hemoglobin: 12.1 g/dL (ref 11.7–15.5)
Lymphs Abs: 1364 cells/uL (ref 850–3900)
MCH: 29.2 pg (ref 27.0–33.0)
MCHC: 31.4 g/dL — ABNORMAL LOW (ref 32.0–36.0)
MCV: 93 fL (ref 80.0–100.0)
MPV: 10.9 fL (ref 7.5–12.5)
Monocytes Relative: 10.8 %
Neutro Abs: 6301 cells/uL (ref 1500–7800)
Neutrophils Relative %: 71.6 %
Platelets: 247 10*3/uL (ref 140–400)
RBC: 4.14 10*6/uL (ref 3.80–5.10)
RDW: 15.2 % — ABNORMAL HIGH (ref 11.0–15.0)
Total Lymphocyte: 15.5 %
WBC: 8.8 10*3/uL (ref 3.8–10.8)

## 2019-06-16 LAB — COMPLETE METABOLIC PANEL WITH GFR
AG Ratio: 1.6 (calc) (ref 1.0–2.5)
ALT: 14 U/L (ref 6–29)
AST: 16 U/L (ref 10–35)
Albumin: 4.1 g/dL (ref 3.6–5.1)
Alkaline phosphatase (APISO): 144 U/L (ref 37–153)
BUN/Creatinine Ratio: 19 (calc) (ref 6–22)
BUN: 61 mg/dL — ABNORMAL HIGH (ref 7–25)
CO2: 30 mmol/L (ref 20–32)
Calcium: 8.7 mg/dL (ref 8.6–10.4)
Chloride: 96 mmol/L — ABNORMAL LOW (ref 98–110)
Creat: 3.23 mg/dL — ABNORMAL HIGH (ref 0.60–0.93)
GFR, Est African American: 16 mL/min/{1.73_m2} — ABNORMAL LOW (ref >=60)
GFR, Est Non African American: 14 mL/min/{1.73_m2} — ABNORMAL LOW (ref >=60)
Globulin: 2.5 g/dL (calc) (ref 1.9–3.7)
Glucose, Bld: 270 mg/dL — ABNORMAL HIGH (ref 65–99)
Potassium: 4.6 mmol/L (ref 3.5–5.3)
Sodium: 138 mmol/L (ref 135–146)
Total Bilirubin: 0.5 mg/dL (ref 0.2–1.2)
Total Protein: 6.6 g/dL (ref 6.1–8.1)

## 2019-06-16 LAB — LIPID PANEL
Cholesterol: 124 mg/dL (ref <200)
HDL: 56 mg/dL (ref >=50)
LDL Cholesterol (Calc): 52 mg/dL (calc)
Non-HDL Cholesterol (Calc): 68 mg/dL (calc) (ref <130)
Total CHOL/HDL Ratio: 2.2 (calc) (ref <5.0)
Triglycerides: 77 mg/dL (ref <150)

## 2019-06-16 LAB — HEMOGLOBIN A1C
Hgb A1c MFr Bld: 5.9 % of total Hgb — ABNORMAL HIGH (ref <5.7)
Mean Plasma Glucose: 123 (calc)
eAG (mmol/L): 6.8 (calc)

## 2019-06-16 LAB — MAGNESIUM: Magnesium: 2.5 mg/dL (ref 1.5–2.5)

## 2019-06-16 LAB — TSH: TSH: 1.97 mIU/L (ref 0.40–4.50)

## 2019-06-17 DIAGNOSIS — I252 Old myocardial infarction: Secondary | ICD-10-CM | POA: Diagnosis not present

## 2019-06-17 DIAGNOSIS — E1122 Type 2 diabetes mellitus with diabetic chronic kidney disease: Secondary | ICD-10-CM | POA: Diagnosis not present

## 2019-06-17 DIAGNOSIS — Z4781 Encounter for orthopedic aftercare following surgical amputation: Secondary | ICD-10-CM | POA: Diagnosis not present

## 2019-06-17 DIAGNOSIS — I132 Hypertensive heart and chronic kidney disease with heart failure and with stage 5 chronic kidney disease, or end stage renal disease: Secondary | ICD-10-CM | POA: Diagnosis not present

## 2019-06-17 DIAGNOSIS — I5042 Chronic combined systolic (congestive) and diastolic (congestive) heart failure: Secondary | ICD-10-CM | POA: Diagnosis not present

## 2019-06-17 DIAGNOSIS — I2510 Atherosclerotic heart disease of native coronary artery without angina pectoris: Secondary | ICD-10-CM | POA: Diagnosis not present

## 2019-06-18 DIAGNOSIS — D509 Iron deficiency anemia, unspecified: Secondary | ICD-10-CM | POA: Diagnosis not present

## 2019-06-18 DIAGNOSIS — N186 End stage renal disease: Secondary | ICD-10-CM | POA: Diagnosis not present

## 2019-06-18 DIAGNOSIS — N2581 Secondary hyperparathyroidism of renal origin: Secondary | ICD-10-CM | POA: Diagnosis not present

## 2019-06-18 DIAGNOSIS — D631 Anemia in chronic kidney disease: Secondary | ICD-10-CM | POA: Diagnosis not present

## 2019-06-18 DIAGNOSIS — Z992 Dependence on renal dialysis: Secondary | ICD-10-CM | POA: Diagnosis not present

## 2019-06-21 DIAGNOSIS — D509 Iron deficiency anemia, unspecified: Secondary | ICD-10-CM | POA: Diagnosis not present

## 2019-06-21 DIAGNOSIS — N186 End stage renal disease: Secondary | ICD-10-CM | POA: Diagnosis not present

## 2019-06-21 DIAGNOSIS — N2581 Secondary hyperparathyroidism of renal origin: Secondary | ICD-10-CM | POA: Diagnosis not present

## 2019-06-21 DIAGNOSIS — Z992 Dependence on renal dialysis: Secondary | ICD-10-CM | POA: Diagnosis not present

## 2019-06-21 DIAGNOSIS — D631 Anemia in chronic kidney disease: Secondary | ICD-10-CM | POA: Diagnosis not present

## 2019-06-22 ENCOUNTER — Other Ambulatory Visit: Payer: Self-pay

## 2019-06-22 ENCOUNTER — Ambulatory Visit (INDEPENDENT_AMBULATORY_CARE_PROVIDER_SITE_OTHER): Payer: Medicare Other | Admitting: Orthopaedic Surgery

## 2019-06-22 ENCOUNTER — Encounter: Payer: Self-pay | Admitting: Orthopaedic Surgery

## 2019-06-22 ENCOUNTER — Encounter (HOSPITAL_BASED_OUTPATIENT_CLINIC_OR_DEPARTMENT_OTHER): Payer: Medicare Other | Admitting: Physician Assistant

## 2019-06-22 ENCOUNTER — Ambulatory Visit (INDEPENDENT_AMBULATORY_CARE_PROVIDER_SITE_OTHER): Payer: Medicare Other

## 2019-06-22 DIAGNOSIS — L97812 Non-pressure chronic ulcer of other part of right lower leg with fat layer exposed: Secondary | ICD-10-CM | POA: Diagnosis not present

## 2019-06-22 DIAGNOSIS — Z8739 Personal history of other diseases of the musculoskeletal system and connective tissue: Secondary | ICD-10-CM

## 2019-06-22 DIAGNOSIS — E1122 Type 2 diabetes mellitus with diabetic chronic kidney disease: Secondary | ICD-10-CM | POA: Diagnosis not present

## 2019-06-22 DIAGNOSIS — L97222 Non-pressure chronic ulcer of left calf with fat layer exposed: Secondary | ICD-10-CM | POA: Diagnosis not present

## 2019-06-22 DIAGNOSIS — L97822 Non-pressure chronic ulcer of other part of left lower leg with fat layer exposed: Secondary | ICD-10-CM | POA: Diagnosis not present

## 2019-06-22 DIAGNOSIS — I120 Hypertensive chronic kidney disease with stage 5 chronic kidney disease or end stage renal disease: Secondary | ICD-10-CM | POA: Diagnosis not present

## 2019-06-22 DIAGNOSIS — M79622 Pain in left upper arm: Secondary | ICD-10-CM

## 2019-06-22 DIAGNOSIS — E11622 Type 2 diabetes mellitus with other skin ulcer: Secondary | ICD-10-CM | POA: Diagnosis not present

## 2019-06-22 DIAGNOSIS — I872 Venous insufficiency (chronic) (peripheral): Secondary | ICD-10-CM | POA: Diagnosis not present

## 2019-06-22 DIAGNOSIS — Z87828 Personal history of other (healed) physical injury and trauma: Secondary | ICD-10-CM

## 2019-06-22 DIAGNOSIS — I890 Lymphedema, not elsewhere classified: Secondary | ICD-10-CM | POA: Diagnosis not present

## 2019-06-22 DIAGNOSIS — N186 End stage renal disease: Secondary | ICD-10-CM | POA: Diagnosis not present

## 2019-06-22 DIAGNOSIS — I25810 Atherosclerosis of coronary artery bypass graft(s) without angina pectoris: Secondary | ICD-10-CM

## 2019-06-22 DIAGNOSIS — I7389 Other specified peripheral vascular diseases: Secondary | ICD-10-CM | POA: Diagnosis not present

## 2019-06-22 MED ORDER — CYCLOBENZAPRINE HCL 5 MG PO TABS: 5.0000 mg | ORAL_TABLET | Freq: Three times a day (TID) | ORAL | 1 refills | Status: DC | PRN

## 2019-06-22 NOTE — Progress Notes (Signed)
The patient is following up again for her left shoulder.  When we saw her at her first visit with [name] she was a week into a dislocated shoulder.  We had to take her to the operating room for close reduction under anesthesia.  We then had her in a sling for 2 weeks.  Is now been about 4 weeks since we reduced her shoulder.  She is out of the sling and says she is working on range of motion of her left shoulder.  She is 74 years old.  On exam her left shoulder is clinically located.  It is weak with abduction but she can hold her shoulder abducted.  She is got better internal and external rotation as well.  3 views of the left shoulder obtained and show that is located.  The humeral head is just slightly high riding at the glenoid suggesting rotator cuff pathology but the shoulder is located in all 3 views.  At this point she will continue to work on shoulder range of motion and can start some strengthening of her shoulder.  All questions and concerns were answered and addressed.  Follow-up will be as needed at this standpoint.  I did send in some Flexeril for her in a refill but I told her we would not keep her on this long-term.

## 2019-06-22 NOTE — Progress Notes (Addendum)
ARALI, SOMERA ([id number]) Visit Report for 06/22/2019 Chief Complaint Document Details Patient Name: Date of Service: [name], [name] 06/22/2019 2:30 PM Medical Record [id number] Patient Account Number: [id number] Date of Birth/Sex: Treating RN: 16-Oct-1945 (74 y.o. [name] Primary Care Provider: [name] Other Clinician: Referring Provider: Treating Provider/Extender:Stone III, Lemar Livings, Lendon Ka Weeks in Treatment: 5 Information Obtained from: Patient Chief Complaint Bilateral LE ulcers and Right plantar foot ulcer Electronic Signature(s) Signed: 06/22/2019 2:47:19 PM By: Worthy Keeler PA-C Entered By: Worthy Keeler on 06/22/2019 14:47:18 -------------------------------------------------------------------------------- HPI Details Patient Name: Date of Service: [name] 06/22/2019 2:30 PM Medical Record [id number] Patient Account Number: [id number] Date of Birth/Sex: Treating RN: [birth year] (74 y.o. [name] Primary Care Provider: [name] Other Clinician: Referring Provider: Treating Provider/Extender:Stone III, Lemar Livings, Lendon Ka Weeks in Treatment: 5 History of Present Illness HPI Description: 02/23/2019 on evaluation today patient presents for initial evaluation in our clinic concerning issues that she has been having with her bilateral lower extremities as well as her plantar foot on the right. This has been quite some time in regard to the foot ulcer even back as far as the beginning of this year that is 2020. She has had the wounds on her legs a much shorter time but that still has been roughly 2-3 months. Nonetheless she is really not shown signs of a lot of improvement. She tells me that she does have a past medical history of diabetes, venous insufficiency, lymphedema which is mild but nonetheless present, peripheral vascular disease and she has previously been recommended to have a  femoral-popliteal bypass but put that off at that point they are basically just monitoring at this time. She has end-stage renal disease with dependence on renal dialysis, hypertension, and obviously wounds of the bilateral lower extremities at this point. She tells me that she is having some discomfort but fortunately nothing too significant at this point which is good news. No fevers, chills, nausea, vomiting, or diarrhea. She is mainly been leaving these areas open to air as much as possible at this point at 1 point she was trying to keep them covered more but that really did not help either. Fortunately there is no signs of active systemic nor local infection at this time. 03/02/2019 on evaluation today patient appears to be doing well with regard to her wounds. The leg ulcers which were very dry are much more moist at this point and seem to be doing great at this time. Overall I am very pleased with how things seem to be progressing. With regard to her foot ulcer it is showing some signs of slight improvement in my opinion overall there does not appear to be anything worse at least which is also good news. No fevers, chills, nausea, vomiting, or diarrhea. 03/09/2019 on evaluation today patient appears to be doing better with regard to her lower extremity ulcers a lot of the necrotic tissue is loosening up which is good news. In regard to her right plantar foot ulcer this appears to be roughly the same based on what I am seeing currently although there is less callus buildup. Unfortunately she does have an open wound area on the left lateral foot region which unfortunately probes down to bone once this was thoroughly examined. This is something she did not even know was opened she had had a Band-Aid over it just for protection last couple times I saw her. 03/16/19 evaluation today patient appears to  be doing Someone poorly in regard to her left lower extremity. This seems to be likely infected  based on what Im seeing at this point. Theres no signs of active infection at this time systemically but nonetheless theres a lot more erythema of the foot in general unfortunately. Her x-rays were reviewed and did not show any evidence of infection of the right foot. On the left there was a A little bit more going on but no obvious evidence of osteomyelitis though it was mentioned that if that was still a concern that number I would be the best way to further evaluate this. I think they were going to need to proceed with MRI to be honest based on what Im seeing today. 03/23/2019 on evaluation today patient actually appears to be doing worse compared to even last week's visit with regard to her wounds especially the left foot but even the right lower extremity. Both are measuring deeper as far as the overall depth of the wound on the left foot this also seems to have spread as far as the open wound location. It also does appear to me that the cellulitis has spread further up her leg which also has me very concerned at this point. There fortunately is no signs of active infection at this point systemically such as evidence of sepsis but nonetheless I am still concerned about the fact that again the patient does not seem to be turning around. We did have her on IV vancomycin through dialysis which they graciously help this with as well. With that being said it appears based on her culture results that I did review today that cefepime or something of the like would likely be a better option the vancomycin is not likely to help with the Serratia species that was identified. 03/28/19 Fem-Pop 04/06/19 Amp toe 64month follow-up with vascular in april 05/18/2019 on evaluation today patient presents for reevaluation here in the clinic after having been sent to the ER by myself December 2020 due to overall worsening of her condition. She did have a femoral-popliteal bypass performed on 03/28/2019. 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 Subsequently she does have a 29-month follow-up with vascular in April but seems to be doing well from a vascular standpoint at this time. Overall the main issue for which she comes in today she does have a area on the plantar aspect of her right foot where she does have a callus there may or may not be a wound under this region. Subsequently she does have several small ulcerations on the bilateral lower extremities that are more venous stasis in nature. Continue to these do need to be addressed today as well. Fortunately there is no signs of active infection at this time. 05/25/2019 upon evaluation today patient appears to be doing well in regard to her lower extremities. Fortunately there is no signs of active infection at this time. No fever chills noted. She has been tolerating the dressing changes without complication. Fortunately she is doing much better in regard to her plantar foot in fact this appears to be completely healed which is excellent news. 06/01/2019 on evaluation today patient actually appears to be doing very well in regard to her wounds at this point. Fortunately there is no signs of active infection. No fevers, chills, nausea, vomiting, or diarrhea. 06/08/2019 upon evaluation today patient actually appears to be doing great in regard to her right lower extremity  there are no open wounds remaining and she has done extremely well. Unfortunately she has several blistered areas that opened up on the left lower extremity in the interim between last week and this week. She has not been wearing any compression she states that I never told her anything about wearing compression. With that being said this is something that we have discussed in the past and it is something that we wanted her to do and again I did remind her of that today as well. 06/15/2019 upon evaluation today patient appears  to be doing well with regard to her wound she does have some dressing material stuck to the surface of her wounds apparently they dried out and healed fairly quickly causing this to stick. Fortunately we should be able to clean this off but again that is one of the main issues that I see at this point. Fortunately there is no signs of active infection at this time. No fevers, chills, nausea, vomiting, or diarrhea. 06/22/2019 upon evaluation today patient appears to be doing very well in regard to her wounds. She has been tolerating the dressing changes without complication. Fortunately there is no signs of active infection at this time. No fevers, chills, nausea, vomiting, or diarrhea. In fact the right leg appears to be completely healed she does have 1 pair of the compression socks with her 15-20 mmHg. Subsequently her left leg is doing much better although not completely healed she still has 1 area open at this point. Electronic Signature(s) Signed: 06/22/2019 2:57:31 PM By: Worthy Keeler PA-C Entered By: Worthy Keeler on 06/22/2019 14:57:30 -------------------------------------------------------------------------------- Physical Exam Details Patient Name: Date of Service: AZORA, BONZO 06/22/2019 2:30 PM Medical Record [id number] Patient Account Number: [id number] Date of Birth/Sex: Treating RN: [birth year] (74 y.o. [name] Primary Care Provider: [name] Other Clinician: Referring Provider: Treating Provider/Extender:Stone III, Lemar Livings, Lendon Ka Weeks in Treatment: 5 Constitutional Well-nourished and well-hydrated in no acute distress. Respiratory normal breathing without difficulty. Psychiatric this patient is able to make decisions and demonstrates good insight into disease process. Alert and Oriented x 3. pleasant and cooperative. Notes Patient's wound bed currently showed signs of good granulation currently. Fortunately there is no signs  of active infection which is also good news. No fevers, chills, nausea, vomiting, or diarrhea. Electronic Signature(s) Signed: 06/22/2019 2:57:56 PM By: Worthy Keeler PA-C Entered By: Worthy Keeler on 06/22/2019 14:57:55 -------------------------------------------------------------------------------- Physician Orders Details Patient Name: Date of Service: [name] 06/22/2019 2:30 PM Medical Record [id number] Patient Account Number: [id number] Date of Birth/Sex: Treating RN: 1945-05-27 (74 y.o. [name] Primary Care Provider: [name] Other Clinician: Referring Provider: Treating Provider/Extender:Stone III, Lemar Livings, Lendon Ka Weeks in Treatment: 5 Verbal / Phone Orders: No Diagnosis Coding ICD-10 Coding Code Description I87.2 Venous insufficiency (chronic) (peripheral) I89.0 Lymphedema, not elsewhere classified L97.812 Non-pressure chronic ulcer of other part of right lower leg with fat layer exposed L97.822 Non-pressure chronic ulcer of other part of left lower leg with fat layer exposed E11.621 Type 2 diabetes mellitus with foot ulcer L97.512 Non-pressure chronic ulcer of 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d I73.89 Other specified peripheral vascular diseases N18.6 End stage renal disease Z99.2 Dependence on renal dialysis I10 Essential (primary) hypertension Follow-up Appointments Return Appointment in 1 week. Dressing Change Frequency Wound #15 Left,Posterior Lower Leg Do not change entire dressing for one week. Skin Barriers/Peri-Wound Care Moisturizing lotion - both legs Wound Cleansing Wound #15  Left,Posterior Lower Leg May shower with protection. - left leg Primary Wound Dressing Wound #15 Left,Posterior Lower Leg Xeroform Secondary Dressing Wound #15 Left,Posterior Lower Leg Dry Gauze Edema Control Kerlix and Coban - Left Lower Extremity Avoid standing for long periods of time Elevate legs to the level of  the heart or above for 30 minutes daily and/or when sitting, a frequency of: Exercise regularly Support Garment 20-30 mm/Hg pressure to: - right leg daily Off-Loading Other: - foam callous pad right plantar callous daily [facility] - discontinue wound care Electronic Signature(s) Signed: 06/22/2019 4:26:16 PM By: Worthy Keeler PA-C Signed: 06/22/2019 4:42:28 PM By: Baruch Gouty RN, BSN Entered By: Baruch Gouty on 06/22/2019 14:55:57 -------------------------------------------------------------------------------- Problem List Details Patient Name: Date of Service: [name] 06/22/2019 2:30 PM Medical Record [id number] Patient Account Number: [id number] Date of Birth/Sex: Treating RN: 1946-03-24 (74 y.o. [name], Linda Primary Care Provider: [name] Other Clinician: Referring Provider: Treating Provider/Extender:Stone III, Lemar Livings, Abelino Derrick in Treatment: 5 Active Problems ICD-10 Evaluated Encounter Code Description Active Date Today Diagnosis I87.2 Venous insufficiency (chronic) (peripheral) 05/18/2019 No Yes I89.0 Lymphedema, not elsewhere classified 05/18/2019 No Yes L97.812 Non-pressure chronic ulcer of other part of right lower 05/18/2019 No Yes leg with fat layer exposed L97.822 Non-pressure chronic ulcer of other part of left lower 05/18/2019 No Yes leg with fat layer exposed E11.621 Type 2 diabetes mellitus with foot ulcer 05/18/2019 No Yes L97.512 Non-pressure chronic ulcer of other part of right foot 05/18/2019 No Yes with fat layer exposed I73.89 Other specified peripheral vascular diseases 05/18/2019 No Yes N18.6 End stage renal disease 05/18/2019 No Yes Z99.2 Dependence on renal dialysis 05/18/2019 No Yes I10 Essential (primary) hypertension 05/18/2019 No Yes Inactive Problems Resolved Problems Electronic Signature(s) Signed: 06/22/2019 2:47:13 PM By: Worthy Keeler PA-C Entered  By: Worthy Keeler on 06/22/2019 14:47:12 -------------------------------------------------------------------------------- Progress Note Details Patient Name: Date of Service: [name] 06/22/2019 2:30 PM Medical Record [id number] Patient Account Number: [id number] Date of Birth/Sex: Treating RN: January 14, 1946 (74 y.o. [name] Primary Care Provider: [name] Other Clinician: Referring Provider: Treating Provider/Extender:Stone III, Lemar Livings, Lendon Ka Weeks in Treatment: 5 Subjective Chief Complaint Information obtained from Patient Bilateral LE ulcers and Right plantar foot ulcer History of Present Illness (HPI) 02/23/2019 on evaluation today patient presents for initial evaluation in our clinic concerning issues that she has been having with her bilateral lower extremities as well as her plantar foot on the right. This has been quite some time in regard to the foot ulcer even back as far as the beginning of this year that is 2020. She has had the wounds on her legs a much shorter time but that still has been roughly 2-3 months. Nonetheless she is really not shown signs of a lot of improvement. She tells me that she does have a past medical history of diabetes, venous insufficiency, lymphedema which is mild but nonetheless present, peripheral vascular disease and she has previously been recommended to have a femoral-popliteal bypass but put that off at that point they are basically just monitoring at this time. She has end-stage renal disease with dependence on renal dialysis, hypertension, and obviously wounds of the bilateral lower extremities at this point. She tells me that she is having some discomfort but fortunately nothing too significant at this point which is good news. No fevers, chills, nausea, vomiting, or diarrhea. She is mainly been leaving these areas  open to air as much as possible at this point at 1 point she was trying to keep them  covered more but that really did not help either. Fortunately there is no signs of active systemic nor local infection at this time. 03/02/2019 on evaluation today patient appears to be doing well with regard to her wounds. The leg ulcers which were very dry are much more moist at this point and seem to be doing great at this time. Overall I am very pleased with how things seem to be progressing. With regard to her foot ulcer it is showing some signs of slight improvement in my opinion overall there does not appear to be anything worse at least which is also good news. No fevers, chills, nausea, vomiting, or diarrhea. 03/09/2019 on evaluation today patient appears to be doing better with regard to her lower extremity ulcers a lot of the necrotic tissue is loosening up which is good news. In regard to her right plantar foot ulcer this appears to be roughly the same based on what I am seeing currently although there is less callus buildup. Unfortunately she does have an open wound area on the left lateral foot region which unfortunately probes down to bone once this was thoroughly examined. This is something she did not even know was opened she had had a Band-Aid over it just for protection last couple times I saw her. 03/16/19 evaluation today patient appears to be doing Someone poorly in regard to her left lower extremity. This seems to be likely infected based on what Ioom seeing at this point. Thereoos no signs of active infection at this time systemically but nonetheless thereoos a lot more erythema of the foot in general unfortunately. Her x-rays were reviewed and did not show any evidence of infection of the right foot. On the left there was a A little bit more going on but no obvious evidence of osteomyelitis though it was mentioned that if that was still a concern that number I would be the best way to further evaluate this. I think they were going to need to proceed with MRI to be  honest based on what Ioom seeing today. 03/23/2019 on evaluation today patient actually appears to be doing worse compared to even last week's visit with regard to her wounds especially the left foot but even the right lower extremity. Both are measuring deeper as far as the overall depth of the wound on the left foot this also seems to have spread as far as the open wound location. It also does appear to me that the cellulitis has spread further up her leg which also has me very concerned at this point. There fortunately is no signs of active infection at this point systemically such as evidence of sepsis but nonetheless I am still concerned about the fact that again the patient does not seem to be turning around. We did have her on IV vancomycin through dialysis which they graciously help this with as well. With that being said it appears based on her culture results that I did review today that cefepime or something of the like would likely be a better option the vancomycin is not likely to help with the Serratia species that was identified. 03/28/19 Fem-Pop 04/06/19 Amp toe 7month follow-up with vascular in april 05/18/2019 on evaluation today patient presents for reevaluation here in the clinic after having been sent to the ER by myself December 2020 due to overall worsening of her condition. She did  have a femoral-popliteal bypass performed on 03/28/2019. 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 Subsequently she does have a 80-month follow-up with vascular in April but seems to be doing well from a vascular standpoint at this time. Overall the main issue for which she comes in today she does have a area on the plantar aspect of her right foot where she does have a callus there may or may not be a wound under this region. Subsequently she does have several small ulcerations on the bilateral lower extremities that are more  venous stasis in nature. Continue to these do need to be addressed today as well. Fortunately there is no signs of active infection at this time. 05/25/2019 upon evaluation today patient appears to be doing well in regard to her lower extremities. Fortunately there is no signs of active infection at this time. No fever chills noted. She has been tolerating the dressing changes without complication. Fortunately she is doing much better in regard to her plantar foot in fact this appears to be completely healed which is excellent news. 06/01/2019 on evaluation today patient actually appears to be doing very well in regard to her wounds at this point. Fortunately there is no signs of active infection. No fevers, chills, nausea, vomiting, or diarrhea. 06/08/2019 upon evaluation today patient actually appears to be doing great in regard to her right lower extremity there are no open wounds remaining and she has done extremely well. Unfortunately she has several blistered areas that opened up on the left lower extremity in the interim between last week and this week. She has not been wearing any compression she states that I never told her anything about wearing compression. With that being said this is something that we have discussed in the past and it is something that we wanted her to do and again I did remind her of that today as well. 06/15/2019 upon evaluation today patient appears to be doing well with regard to her wound she does have some dressing material stuck to the surface of her wounds apparently they dried out and healed fairly quickly causing this to stick. Fortunately we should be able to clean this off but again that is one of the main issues that I see at this point. Fortunately there is no signs of active infection at this time. No fevers, chills, nausea, vomiting, or diarrhea. 06/22/2019 upon evaluation today patient appears to be doing very well in regard to her wounds. She has  been tolerating the dressing changes without complication. Fortunately there is no signs of active infection at this time. No fevers, chills, nausea, vomiting, or diarrhea. In fact the right leg appears to be completely healed she does have 1 pair of the compression socks with her 15-20 mmHg. Subsequently her left leg is doing much better although not completely healed she still has 1 area open at this point. Objective Constitutional Well-nourished and well-hydrated in no acute distress. Vitals Time Taken: 2:30 PM, Height: 64 in, Weight: 160 lbs, BMI: 27.5, Temperature: 98.1 F, Pulse: 67 bpm, Respiratory Rate: 18 breaths/min, Blood Pressure: 150/63 mmHg, Capillary Blood Glucose: 117 mg/dl. Respiratory normal breathing without difficulty. Psychiatric this patient is able to make decisions and demonstrates good insight into disease process. Alert and Oriented x 3. pleasant and cooperative. General Notes: Patient's wound bed currently showed signs of good granulation currently. Fortunately there is no signs of active infection which is also good news.  No fevers, chills, nausea, vomiting, or diarrhea. Integumentary (Hair, Skin) Wound #12 status is Healed - Epithelialized. Original cause of wound was Blister. The wound is located on the Left,Proximal,Anterior Lower Leg. The wound measures 0cm length x 0cm width x 0cm depth; 0cm^2 area and 0cm^3 volume. Wound #15 status is Open. Original cause of wound was Blister. The wound is located on the Left,Posterior Lower Leg. The wound measures 1cm length x 1.5cm width x 0.1cm depth; 1.178cm^2 area and 0.118cm^3 volume. There is Fat Layer (Subcutaneous Tissue) Exposed exposed. There is no tunneling or undermining noted. There is a medium amount of serosanguineous drainage noted. The wound margin is distinct with the outline attached to the wound base. There is large (67-100%) red, pink granulation within the wound bed. There is no necrotic tissue  within the wound bed. 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 Dressing Change Frequency: Wound #15 Left,Posterior Lower Leg: Do not change entire dressing for one week. Skin Barriers/Peri-Wound Care: Moisturizing lotion - both legs Wound Cleansing: Wound #15 Left,Posterior Lower Leg: May shower with protection. - left leg Primary Wound Dressing: Wound #15 Left,Posterior Lower Leg: Xeroform Secondary Dressing: Wound #15 Left,Posterior Lower Leg: Dry Gauze Edema Control: Kerlix and Coban - Left Lower Extremity Avoid standing for long periods of time Elevate legs to the level of the heart or above for 30 minutes daily and/or when sitting, a frequency of: Exercise regularly Support Garment 20-30 mm/Hg pressure to: - right leg daily Off-Loading: Other: - foam callous pad right plantar callous daily Home Health: Discontinue home health - [facility] - discontinue wound care 1. My suggestion at this point is can be that we continue with the Xeroform for the left lower extremity wound which is still open. 2. I am also going to suggest that we continue with the compression wrap for the left lower extremity which seems to have been doing well for the patient as well. 3. I am also going to recommend that she wear her compression sock pretty much throughout the day. She can take this off at nighttime. With that being said she should  wear it from time she gets up in the morning to when she goes to bed at night to lay down. We will see patient back for reevaluation in 1 week here in the clinic. If anything worsens or changes patient will contact our office for additional recommendations. Electronic Signature(s) Signed: 06/22/2019 2:59:00 PM By: Worthy Keeler PA-C Entered By: Worthy Keeler on 06/22/2019 14:58:59 -------------------------------------------------------------------------------- SuperBill Details Patient Name: Date of Service: [name] 06/22/2019 Medical Record [id number] Patient Account Number: [id number] Date of Birth/Sex: Treating RN: Aug 31, 1945 (74 y.o. 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 diabetes mellitus with foot ulcer L97.512 Non-pressure chronic ulcer of 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d I73.89 Other specified peripheral vascular diseases N18.6 End stage renal disease Z99.2 Dependence on renal dialysis I10 Essential (primary) hypertension Facility Procedures CPT4 Code: 47096283 Description: 99214 - WOUND CARE VISIT-LEV 4 EST PT Modifier: Quantity: 1 Physician Procedures CPT4 Code Description: [id number] - WC PHYS LEVEL 3 - EST PT ICD-10 Diagnosis Description I87.2 Venous insufficiency (chronic) (peripheral) I89.0 Lymphedema, not elsewhere classified L97.812 Non-pressure chronic ulcer of other part of right lower  L97.822 Non-pressure chronic ulcer of other part of left lower  l Modifier: leg with fat la eg with fat lay Quantity: 1 yer exposed er exposed Electronic Signature(s) Signed: 06/22/2019 3:02:29 PM By: Worthy Keeler PA-C Entered By: Worthy Keeler on 06/22/2019 15:02:29

## 2019-06-23 DIAGNOSIS — N2581 Secondary hyperparathyroidism of renal origin: Secondary | ICD-10-CM | POA: Diagnosis not present

## 2019-06-23 DIAGNOSIS — N186 End stage renal disease: Secondary | ICD-10-CM | POA: Diagnosis not present

## 2019-06-23 DIAGNOSIS — D509 Iron deficiency anemia, unspecified: Secondary | ICD-10-CM | POA: Diagnosis not present

## 2019-06-23 DIAGNOSIS — Z992 Dependence on renal dialysis: Secondary | ICD-10-CM | POA: Diagnosis not present

## 2019-06-23 DIAGNOSIS — D631 Anemia in chronic kidney disease: Secondary | ICD-10-CM | POA: Diagnosis not present

## 2019-06-24 NOTE — Progress Notes (Signed)
[name] ([id number]) Visit Report for 06/22/2019 Arrival Information Details Patient Name: Date of Service: [name], [name] 06/22/2019 2:30 PM Medical Record [id number] Patient Account Number: [id number] Date of Birth/Sex: Treating RN: 1945/06/24 (74 y.o. Helene Shoe, Meta.Reding Primary Care Provider: [name] Other Clinician: Referring Provider: Treating Provider/Extender:Stone III, Lemar Livings, Abelino Derrick in Treatment: 5 Visit Information History Since Last Visit Added or deleted any medications: No Patient Arrived: Ambulatory Any new allergies or adverse reactions: No Arrival Time: 14:30 Had a fall or experienced change in No Accompanied By: self activities of daily living that may affect Transfer Assistance: None risk of falls: Patient Identification Verified: Yes Signs or symptoms of abuse/neglect since last No Secondary Verification Process Yes visito Completed: Hospitalized since last visit: No Patient Requires Transmission- No Implantable device outside of the clinic excluding No Based Precautions: cellular tissue based products placed in the center Patient Has Alerts: Yes since last visit: Patient Alerts: R ABI non Has Dressing in Place as Prescribed: Yes compressible Has Compression in Place as Prescribed: Yes L ABI non Pain Present Now: No compressible Electronic Signature(s) Signed: 06/22/2019 4:31:34 PM By: Deon Pilling Entered By: Deon Pilling on 06/22/2019 14:35:45 -------------------------------------------------------------------------------- Clinic Level of Care Assessment Details Patient Name: Date of Service: [name], [name] 06/22/2019 2:30 PM Medical Record [id number] Patient Account Number: [id number] Date of Birth/Sex: Treating RN: December 12, 1945 (74 y.o. [name] Primary Care Provider: [name] Other Clinician: Referring Provider: Treating Provider/Extender:Stone III, Lemar Livings, Abelino Derrick in Treatment: 5 Clinic Level of Care Assessment Items TOOL 4 Quantity Score [] - Use when only an EandM is performed on FOLLOW-UP visit 0 ASSESSMENTS - Nursing Assessment / Reassessment X - Reassessment of Co-morbidities (includes updates in patient status) 1 10 X - Reassessment of Adherence to Treatment Plan 1 5 ASSESSMENTS - Wound and Skin Assessment / Reassessment [] - Simple Wound Assessment / Reassessment - one wound 0 X - Complex Wound Assessment / Reassessment - multiple wounds 2 5 [] - Dermatologic / Skin Assessment (not related to wound area) 0 ASSESSMENTS - Focused Assessment X - Circumferential Edema Measurements - multi extremities 1 5 [] - Nutritional Assessment / Counseling / Intervention 0 X - Lower Extremity Assessment (monofilament, tuning fork, pulses) 1 5 [] - Peripheral Arterial Disease Assessment (using hand held doppler) 0 ASSESSMENTS - Ostomy and/or Continence Assessment and Care [] - Incontinence Assessment and Management 0 [] - Ostomy Care Assessment and Management (repouching, etc.) 0 PROCESS - Coordination of Care X - Simple Patient / Family Education for ongoing care 1 15 [] - Complex (extensive) Patient / Family Education for ongoing care 0 X - Staff obtains Programmer, systems, Records, Test Results / Process Orders 1 10 [] - Staff telephones HHA, Nursing Homes / Clarify orders / etc 0 [] - Routine Transfer to another Facility (non-emergent condition) 0 [] - Routine Hospital Admission (non-emergent condition) 0 [] - New Admissions / Biomedical engineer / Ordering NPWT, Apligraf, etc. 0 [] - Emergency Hospital Admission (emergent condition) 0 X - Simple Discharge Coordination 1 10 [] - Complex (extensive) Discharge Coordination 0 PROCESS - Special Needs [] - Pediatric / Minor Patient Management 0 [] - Isolation Patient Management 0 [] - Hearing / Language / Visual special needs 0 [] - Assessment of Community assistance (transportation, D/C planning,  etc.) 0 [] - Additional assistance / Altered mentation 0 [] - Support Surface(s) Assessment (bed, cushion, seat, etc.) 0 INTERVENTIONS - Wound Cleansing / Measurement [] -  Simple Wound Cleansing - one wound 0 X - Complex Wound Cleansing - multiple wounds 2 5 X - Wound Imaging (photographs - any number of wounds) 1 5 [] - Wound Tracing (instead of photographs) 0 [] - Simple Wound Measurement - one wound 0 X - Complex Wound Measurement - multiple wounds 2 5 INTERVENTIONS - Wound Dressings [] - Small Wound Dressing one or multiple wounds 0 X - Medium Wound Dressing one or multiple wounds 1 15 [] - Large Wound Dressing one or multiple wounds 0 X - Application of Medications - topical 1 5 [] - Application of Medications - injection 0 INTERVENTIONS - Miscellaneous [] - External ear exam 0 [] - Specimen Collection (cultures, biopsies, blood, body fluids, etc.) 0 [] - Specimen(s) / Culture(s) sent or taken to Lab for analysis 0 [] - Patient Transfer (multiple staff / Civil Service fast streamer / Similar devices) 0 [] - Simple Staple / Suture removal (25 or less) 0 [] - Complex Staple / Suture removal (26 or more) 0 [] - Hypo / Hyperglycemic Management (close monitor of Blood Glucose) 0 [] - Ankle / Brachial Index (ABI) - do not check if billed separately 0 X - Vital Signs 1 5 Has the patient been seen at the hospital within the last three years: Yes Total Score: 120 Level Of Care: New/Established - Level 4 Electronic Signature(s) Signed: 06/22/2019 4:42:28 PM By: Baruch Gouty RN, BSN Entered By: Baruch Gouty on 06/22/2019 14:52:45 -------------------------------------------------------------------------------- Encounter Discharge Information Details Patient Name: Date of Service: [name] 06/22/2019 2:30 PM Medical Record [id number] Patient Account Number: [id number] Date of Birth/Sex: Treating RN: 1945-06-30 (74 y.o. [name] Primary Care Clytie Shetley: [name] Other  Clinician: Referring Jusitn Salsgiver: Treating Irini Leet/Extender:Stone III, Lemar Livings, Abelino Derrick in Treatment: 5 Encounter Discharge Information Items Discharge Condition: Stable Ambulatory Status: Ambulatory Discharge Destination: Home Transportation: Private Auto Accompanied By: self Schedule Follow-up Appointment: Yes Clinical Summary of Care: Patient Declined Electronic Signature(s) Signed: 06/24/2019 5:08:14 PM By: Carlene Coria RN Entered By: Carlene Coria on 06/22/2019 15:14:42 -------------------------------------------------------------------------------- Lower Extremity Assessment Details Patient Name: Date of Service: [name], [name] 06/22/2019 2:30 PM Medical Record [id number] Patient Account Number: [id number] Date of Birth/Sex: Treating RN: 1946/03/23 (74 y.o. Debby Bud Primary Care Jeannelle Wiens: [name] Other Clinician: Referring Dellene Mcgroarty: Treating Deana Krock/Extender:Stone III, Lemar Livings, Lendon Ka Weeks in Treatment: 5 Edema Assessment Assessed: [Left: Yes] [Right: Yes] Edema: [Left: No] [Right: No] Calf Left: Right: Point of Measurement: 31 cm From Medial Instep 29.5 cm 28 cm Ankle Left: Right: Point of Measurement: 11 cm From Medial Instep 19 cm 18 cm Vascular Assessment Pulses: Dorsalis Pedis Palpable: [Left:Yes] [Right:Yes] Electronic Signature(s) Signed: 06/22/2019 4:31:34 PM By: Deon Pilling Entered By: Deon Pilling on 06/22/2019 14:35:17 -------------------------------------------------------------------------------- Multi-Disciplinary Care Plan Details Patient Name: Date of Service: [name] 06/22/2019 2:30 PM Medical Record [id number] Patient Account Number: [id number] Date of Birth/Sex: Treating RN: October 02, 1945 (74 y.o. [name] Primary Care Mikesha Migliaccio: [name] Other Clinician: Referring Genoveva Singleton: Treating Raylynne Cubbage/Extender:Stone III, Lemar Livings, Abelino Derrick in Treatment:  5 Active Inactive Nutrition Nursing Diagnoses: Impaired glucose control: actual or potential Potential for alteratiion in Nutrition/Potential for imbalanced nutrition Goals: Patient/caregiver will maintain therapeutic glucose control Date Initiated: 05/18/2019 Target Resolution Date: 07/13/2019 Goal Status: Active Interventions: Assess HgA1c results as ordered upon admission and as needed Assess patient nutrition upon admission and as needed per policy Treatment Activities: Patient referred to Primary Care Physician for further nutritional evaluation :  05/18/2019 Notes: Wound/Skin Impairment Nursing Diagnoses: Impaired tissue integrity Knowledge deficit related to ulceration/compromised skin integrity Goals: Patient/caregiver will verbalize understanding of skin care regimen Date Initiated: 05/18/2019 Target Resolution Date: 07/13/2019 Goal Status: Active Ulcer/skin breakdown will have a volume reduction of 30% by week 4 Date Initiated: 05/18/2019 Date Inactivated: 06/15/2019 Target Resolution Date: 06/15/2019 Goal Status: Met Ulcer/skin breakdown will have a volume reduction of 50% by week 8 Date Initiated: 06/15/2019 Target Resolution Date: 07/13/2019 Goal Status: Active Interventions: Assess patient/caregiver ability to obtain necessary supplies Assess patient/caregiver ability to perform ulcer/skin care regimen upon admission and as needed Assess ulceration(s) every visit Provide education on ulcer and skin care Treatment Activities: Skin care regimen initiated : 05/18/2019 Topical wound management initiated : 05/18/2019 Notes: Electronic Signature(s) Signed: 06/22/2019 4:42:28 PM By: Baruch Gouty RN, BSN Entered By: Baruch Gouty on 06/22/2019 14:49:46 -------------------------------------------------------------------------------- Pain Assessment Details Patient Name: Date of Service: [name] 06/22/2019 2:30 PM Medical Record [id number] Patient Account  Number: [id number] Date of Birth/Sex: Treating RN: Nov 03, 1945 (74 y.o. Debby Bud Primary Care Provider: [name] Other Clinician: Referring Provider: Treating Provider/Extender:Stone III, Lemar Livings, Lendon Ka Weeks in Treatment: 5 Active Problems Location of Pain Severity and Description of Pain Patient Has Paino No Site Locations Rate the pain. Current Pain Level: 0 Pain Management and Medication Current Pain Management: Medication: No Cold Application: No Rest: No Massage: No Activity: No T.E.N.S.: No Heat Application: No Leg drop or elevation: No Is the Current Pain Management Adequate: Adequate How does your wound impact your activities of daily livingo Sleep: No Bathing: No Appetite: No Relationship With Others: No Bladder Continence: No Emotions: No Bowel Continence: No Work: No Toileting: No Drive: No Dressing: No Hobbies: No Electronic Signature(s) Signed: 06/22/2019 4:31:34 PM By: Deon Pilling Entered By: Deon Pilling on 06/22/2019 14:36:24 -------------------------------------------------------------------------------- Patient/Caregiver Education Details Patient Name: Date of Service: [name] 3/17/2021andnbsp2:30 PM Medical Record [id number] Patient Account Number: [id number] Date of Birth/Gender: Treating RN: 08-16-1945 (74 y.o. [name] Primary Care Physician: [name] Other Clinician: Referring Physician: Treating Physician/Extender:Stone III, Lemar Livings, Abelino Derrick in Treatment: 5 Education Assessment Education Provided To: Patient Education Topics Provided Venous: Methods: Explain/Verbal Responses: Reinforcements needed, State content correctly Wound/Skin Impairment: Methods: Explain/Verbal Responses: Reinforcements needed, State content correctly Electronic Signature(s) Signed: 06/22/2019 4:42:28 PM By: Baruch Gouty RN, BSN Entered By: Baruch Gouty on 06/22/2019  14:50:16 -------------------------------------------------------------------------------- Wound Assessment Details Patient Name: Date of Service: [name] 06/22/2019 2:30 PM Medical Record [id number] Patient Account Number: [id number] Date of Birth/Sex: Treating RN: 26-Nov-1945 (74 y.o. Helene Shoe, Meta.Reding Primary Care Provider: [name] Other Clinician: Referring Provider: Treating Provider/Extender:Stone III, Lemar Livings, Lendon Ka Weeks in Treatment: 5 Wound Status Wound Number: 12 Primary Diabetic Wound/Ulcer of the Lower Etiology: Extremity Wound Location: Left, Proximal, Anterior Lower Leg Wound Status: Healed - Epithelialized Wounding Event: Blister Date Acquired: 06/08/2019 Weeks Of Treatment: 2 Clustered Wound: No Wound Measurements Length: (cm) 0 % Reduct Width: (cm) 0 % Reduct Depth: (cm) 0 Area: (cm) 0 Volume: (cm) 0 Wound Description Classification: Grade 2 ion in Area: 100% ion in Volume: 100% Electronic Signature(s) Signed: 06/22/2019 4:31:34 PM By: Deon Pilling Entered By: Deon Pilling on 06/22/2019 14:36:36 -------------------------------------------------------------------------------- Wound Assessment Details Patient Name: Date of Service: [name], [name] 06/22/2019 2:30 PM Medical Record [id number] Patient Account Number: [id number] Date of Birth/Sex: Treating RN: 25-Jan-1946 (74 y.o. Debby Bud Primary Care Provider: [name] Other Clinician: Referring Provider: Treating  Sher Shampine/Extender:Stone III, Lemar Livings, Lendon Ka Weeks in Treatment: 5 Wound Status Wound Number: 15 Primary Diabetic Wound/Ulcer of the Lower Extremity Etiology: Wound Location: Left Lower Leg - Posterior Wound Open Wounding Event: Blister Status: Date Acquired: 06/08/2019 Comorbid Cataracts, Anemia, Asthma, Congestive Heart Weeks Of Treatment: 2 History: Failure, Coronary Artery Disease, Clustered Wound: No Hypertension,  Peripheral Arterial Disease, Peripheral Venous Disease, Type II Diabetes, End Stage Renal Disease, Osteoarthritis, Osteomyelitis, Neuropathy, Received Radiation Photos Photo Uploaded By: Mikeal Hawthorne on 06/23/2019 11:37:12 Wound Measurements Length: (cm) 1 % Reduction Width: (cm) 1.5 % Reduction Depth: (cm) 0.1 Epitheliali Area: (cm) 1.178 Tunneling: Volume: (cm) 0.118 Underminin Wound Description Classification: Grade 2 Wound Margin: Distinct, outline attached Exudate Amount: Medium Exudate Type: Serosanguineous Exudate Color: red, brown Wound Bed Granulation Amount: Large (67-100%) Granulation Quality: Red, Pink Necrotic Amount: None Present (0%) Foul Odor After Cleansing: No Slough/Fibrino No Exposed Structure Fascia Exposed: No Fat Layer (Subcutaneous Tissue) Exposed: Yes Tendon Exposed: No Muscle Exposed: No Joint Exposed: No Bone Exposed: No in Area: 83.3% in Volume: 83.3% zation: Large (67-100%) No g: No Treatment Notes Wound #15 (Left, Posterior Lower Leg) 1. Cleanse With Wound Cleanser Soap and water 3. Primary Dressing Applied Xeroform Gauze 4. Secondary Dressing Dry Gauze 6. Support Layer Applied Kerlix/Coban Notes Horticulturist, commercial) Signed: 06/22/2019 4:31:34 PM By: Deon Pilling Entered By: Deon Pilling on 06/22/2019 14:37:26 -------------------------------------------------------------------------------- Vitals Details Patient Name: Date of Service: [name] 06/22/2019 2:30 PM Medical Record [id number] Patient Account Number: [id number] Date of Birth/Sex: Treating RN: 02-10-1946 (74 y.o. Helene Shoe, Meta.Reding Primary Care Makela Niehoff: [name] Other Clinician: Referring Aairah Negrette: Treating Tamer Baughman/Extender:Stone III, Lemar Livings, Lendon Ka Weeks in Treatment: 5 Vital Signs Time Taken: 14:30 Temperature (F): 98.1 Height (in): 64 Pulse (bpm): 67 Weight (lbs): 160 Respiratory Rate (breaths/min):  18 Body Mass Index (BMI): 27.5 Blood Pressure (mmHg): 150/63 Capillary Blood Glucose (mg/dl): 117 Reference Range: 80 - 120 mg / dl Electronic Signature(s) Signed: 06/22/2019 4:31:34 PM By: Deon Pilling Entered By: Deon Pilling on 06/22/2019 14:36:15

## 2019-06-25 DIAGNOSIS — Z992 Dependence on renal dialysis: Secondary | ICD-10-CM | POA: Diagnosis not present

## 2019-06-25 DIAGNOSIS — N186 End stage renal disease: Secondary | ICD-10-CM | POA: Diagnosis not present

## 2019-06-25 DIAGNOSIS — D631 Anemia in chronic kidney disease: Secondary | ICD-10-CM | POA: Diagnosis not present

## 2019-06-25 DIAGNOSIS — N2581 Secondary hyperparathyroidism of renal origin: Secondary | ICD-10-CM | POA: Diagnosis not present

## 2019-06-25 DIAGNOSIS — D509 Iron deficiency anemia, unspecified: Secondary | ICD-10-CM | POA: Diagnosis not present

## 2019-06-28 DIAGNOSIS — D509 Iron deficiency anemia, unspecified: Secondary | ICD-10-CM | POA: Diagnosis not present

## 2019-06-28 DIAGNOSIS — N186 End stage renal disease: Secondary | ICD-10-CM | POA: Diagnosis not present

## 2019-06-28 DIAGNOSIS — N2581 Secondary hyperparathyroidism of renal origin: Secondary | ICD-10-CM | POA: Diagnosis not present

## 2019-06-28 DIAGNOSIS — D631 Anemia in chronic kidney disease: Secondary | ICD-10-CM | POA: Diagnosis not present

## 2019-06-28 DIAGNOSIS — Z992 Dependence on renal dialysis: Secondary | ICD-10-CM | POA: Diagnosis not present

## 2019-06-29 ENCOUNTER — Encounter (HOSPITAL_BASED_OUTPATIENT_CLINIC_OR_DEPARTMENT_OTHER): Payer: Medicare Other | Admitting: Physician Assistant

## 2019-06-29 ENCOUNTER — Other Ambulatory Visit: Payer: Self-pay

## 2019-06-29 DIAGNOSIS — E1122 Type 2 diabetes mellitus with diabetic chronic kidney disease: Secondary | ICD-10-CM | POA: Diagnosis not present

## 2019-06-29 DIAGNOSIS — I890 Lymphedema, not elsewhere classified: Secondary | ICD-10-CM | POA: Diagnosis not present

## 2019-06-29 DIAGNOSIS — I872 Venous insufficiency (chronic) (peripheral): Secondary | ICD-10-CM | POA: Diagnosis not present

## 2019-06-29 DIAGNOSIS — I120 Hypertensive chronic kidney disease with stage 5 chronic kidney disease or end stage renal disease: Secondary | ICD-10-CM | POA: Diagnosis not present

## 2019-06-29 DIAGNOSIS — N186 End stage renal disease: Secondary | ICD-10-CM | POA: Diagnosis not present

## 2019-06-29 DIAGNOSIS — E11622 Type 2 diabetes mellitus with other skin ulcer: Secondary | ICD-10-CM | POA: Diagnosis not present

## 2019-06-29 DIAGNOSIS — L97822 Non-pressure chronic ulcer of other part of left lower leg with fat layer exposed: Secondary | ICD-10-CM | POA: Diagnosis not present

## 2019-06-29 DIAGNOSIS — L97512 Non-pressure chronic ulcer of 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d: Secondary | ICD-10-CM | POA: Diagnosis not present

## 2019-06-29 DIAGNOSIS — E11621 Type 2 diabetes mellitus with foot ulcer: Secondary | ICD-10-CM | POA: Diagnosis not present

## 2019-06-29 DIAGNOSIS — L97812 Non-pressure chronic ulcer of other part of right lower leg with fat layer exposed: Secondary | ICD-10-CM | POA: Diagnosis not present

## 2019-06-29 NOTE — Progress Notes (Addendum)
TREA, CARNEGIE ([id number]) Visit Report for 06/29/2019 Chief Complaint Document Details Patient Name: Date of Service: Pamela, [name] 06/29/2019 2:00 PM Medical Record [id number] Patient Account Number: [id number] Date of Birth/Sex: Treating RN: September 13, 1945 (74 y.o. [name] Primary Care Provider: [name] Other Clinician: Referring Provider: Treating Provider/Extender:Stone III, Lemar Livings, Lendon Ka Weeks in Treatment: 6 Information Obtained from: Patient Chief Complaint Bilateral LE ulcers and Right plantar foot ulcer Electronic Signature(s) Signed: 06/29/2019 3:15:49 PM By: Worthy Keeler PA-C Entered By: Worthy Keeler on 06/29/2019 15:15:48 -------------------------------------------------------------------------------- HPI Details Patient Name: Date of Service: [name] 06/29/2019 2:00 PM Medical Record [id number] Patient Account Number: [id number] Date of Birth/Sex: Treating RN: 06-Sep-1945 (74 y.o. [name] Primary Care Provider: [name] Other Clinician: Referring Provider: Treating Provider/Extender:Stone III, Lemar Livings, Lendon Ka Weeks in Treatment: 6 History of Present Illness HPI Description: 02/23/2019 on evaluation today patient presents for initial evaluation in our clinic concerning issues that she has been having with her bilateral lower extremities as well as her plantar foot on the right. This has been quite some time in regard to the foot ulcer even back as far as the beginning of this year that is 2020. She has had the wounds on her legs a much shorter time but that still has been roughly 2-3 months. Nonetheless she is really not shown signs of a lot of improvement. She tells me that she does have a past medical history of diabetes, venous insufficiency, lymphedema which is mild but nonetheless present, peripheral vascular disease and she has previously been recommended to have a  femoral-popliteal bypass but put that off at that point they are basically just monitoring at this time. She has end-stage renal disease with dependence on renal dialysis, hypertension, and obviously wounds of the bilateral lower extremities at this point. She tells me that she is having some discomfort but fortunately nothing too significant at this point which is good news. No fevers, chills, nausea, vomiting, or diarrhea. She is mainly been leaving these areas open to air as much as possible at this point at 1 point she was trying to keep them covered more but that really did not help either. Fortunately there is no signs of active systemic nor local infection at this time. 03/02/2019 on evaluation today patient appears to be doing well with regard to her wounds. The leg ulcers which were very dry are much more moist at this point and seem to be doing great at this time. Overall I am very pleased with how things seem to be progressing. With regard to her foot ulcer it is showing some signs of slight improvement in my opinion overall there does not appear to be anything worse at least which is also good news. No fevers, chills, nausea, vomiting, or diarrhea. 03/09/2019 on evaluation today patient appears to be doing better with regard to her lower extremity ulcers a lot of the necrotic tissue is loosening up which is good news. In regard to her right plantar foot ulcer this appears to be roughly the same based on what I am seeing currently although there is less callus buildup. Unfortunately she does have an open wound area on the left lateral foot region which unfortunately probes down to bone once this was thoroughly examined. This is something she did not even know was opened she had had a Band-Aid over it just for protection last couple times I saw her. 03/16/19 evaluation today patient appears to  be doing Someone poorly in regard to her left lower extremity. This seems to be likely infected  based on what Im seeing at this point. Theres no signs of active infection at this time systemically but nonetheless theres a lot more erythema of the foot in general unfortunately. Her x-rays were reviewed and did not show any evidence of infection of the right foot. On the left there was a A little bit more going on but no obvious evidence of osteomyelitis though it was mentioned that if that was still a concern that number I would be the best way to further evaluate this. I think they were going to need to proceed with MRI to be honest based on what Im seeing today. 03/23/2019 on evaluation today patient actually appears to be doing worse compared to even last week's visit with regard to her wounds especially the left foot but even the right lower extremity. Both are measuring deeper as far as the overall depth of the wound on the left foot this also seems to have spread as far as the open wound location. It also does appear to me that the cellulitis has spread further up her leg which also has me very concerned at this point. There fortunately is no signs of active infection at this point systemically such as evidence of sepsis but nonetheless I am still concerned about the fact that again the patient does not seem to be turning around. We did have her on IV vancomycin through dialysis which they graciously help this with as well. With that being said it appears based on her culture results that I did review today that cefepime or something of the like would likely be a better option the vancomycin is not likely to help with the Serratia species that was identified. 03/28/19 Fem-Pop 04/06/19 Amp toe 31month follow-up with vascular in april 05/18/2019 on evaluation today patient presents for reevaluation here in the clinic after having been sent to the ER by myself December 2020 due to overall worsening of her condition. She did have a femoral-popliteal bypass performed on 03/28/2019. 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 Subsequently she does have a 47-month follow-up with vascular in April but seems to be doing well from a vascular standpoint at this time. Overall the main issue for which she comes in today she does have a area on the plantar aspect of her right foot where she does have a callus there may or may not be a wound under this region. Subsequently she does have several small ulcerations on the bilateral lower extremities that are more venous stasis in nature. Continue to these do need to be addressed today as well. Fortunately there is no signs of active infection at this time. 05/25/2019 upon evaluation today patient appears to be doing well in regard to her lower extremities. Fortunately there is no signs of active infection at this time. No fever chills noted. She has been tolerating the dressing changes without complication. Fortunately she is doing much better in regard to her plantar foot in fact this appears to be completely healed which is excellent news. 06/01/2019 on evaluation today patient actually appears to be doing very well in regard to her wounds at this point. Fortunately there is no signs of active infection. No fevers, chills, nausea, vomiting, or diarrhea. 06/08/2019 upon evaluation today patient actually appears to be doing great in regard to her right lower extremity  there are no open wounds remaining and she has done extremely well. Unfortunately she has several blistered areas that opened up on the left lower extremity in the interim between last week and this week. She has not been wearing any compression she states that I never told her anything about wearing compression. With that being said this is something that we have discussed in the past and it is something that we wanted her to do and again I did remind her of that today as well. 06/15/2019 upon evaluation today patient appears  to be doing well with regard to her wound she does have some dressing material stuck to the surface of her wounds apparently they dried out and healed fairly quickly causing this to stick. Fortunately we should be able to clean this off but again that is one of the main issues that I see at this point. Fortunately there is no signs of active infection at this time. No fevers, chills, nausea, vomiting, or diarrhea. 06/22/2019 upon evaluation today patient appears to be doing very well in regard to her wounds. She has been tolerating the dressing changes without complication. Fortunately there is no signs of active infection at this time. No fevers, chills, nausea, vomiting, or diarrhea. In fact the right leg appears to be completely healed she does have 1 pair of the compression socks with her 15-20 mmHg. Subsequently her left leg is doing much better although not completely healed she still has 1 area open at this point. 06/29/2019 on evaluation today patient appears to be doing excellent in regard to her left lower extremity. There is no signs of active infection at this time the wounds appear to be completely healed and overall she is doing excellent. Electronic Signature(s) Signed: 06/29/2019 3:35:29 PM By: Worthy Keeler PA-C Entered By: Worthy Keeler on 06/29/2019 15:35:28 -------------------------------------------------------------------------------- Physical Exam Details Patient Name: Date of Service: Pamela, [name] 06/29/2019 2:00 PM Medical Record [id number] Patient Account Number: [id number] Date of Birth/Sex: Treating RN: 1946/03/08 (74 y.o. [name] Primary Care Provider: [name] Other Clinician: Referring Provider: Treating Provider/Extender:Stone III, Lemar Livings, Lendon Ka Weeks in Treatment: 6 Constitutional Well-nourished and well-hydrated in no acute distress. Respiratory normal breathing without difficulty. Psychiatric this patient is  able to make decisions and demonstrates good insight into disease process. Alert and Oriented x 3. pleasant and cooperative. Notes Patient's wound bed again showed signs of complete epithelization there is no open wounds at any site there was some dry skin I was able to gently get the soft no open areas underneath any of the dry skin patches. This is great news and the patient is very pleased to see this. Electronic Signature(s) Signed: 06/29/2019 3:35:44 PM By: Worthy Keeler PA-C Entered By: Worthy Keeler on 06/29/2019 15:35:44 -------------------------------------------------------------------------------- Physician Orders Details Patient Name: Date of Service: [name] 06/29/2019 2:00 PM Medical Record [id number] Patient Account Number: [id number] Date of Birth/Sex: Treating RN: 09/11/1945 (74 y.o. [name] Primary Care Provider: Other Clinician: [name] Referring Provider: Treating Provider/Extender:Stone III, Lemar Livings, Abelino Derrick in Treatment: 6 Verbal / Phone Orders: No Diagnosis Coding ICD-10 Coding Code Description I87.2 Venous insufficiency (chronic) (peripheral) I89.0 Lymphedema, not elsewhere classified L97.812 Non-pressure chronic ulcer of other part of right lower leg with fat layer exposed L97.822 Non-pressure chronic ulcer of other part of left lower leg with fat layer exposed E11.621 Type 2 diabetes mellitus with foot ulcer L97.512 Non-pressure chronic ulcer of other  part of right foot with fat layer exposed I73.89 Other specified peripheral vascular diseases N18.6 End stage renal disease Z99.2 Dependence on renal dialysis I10 Essential (primary) hypertension Discharge From [facility] Services Discharge from [place] Moisturizing lotion - both legs daily Edema Control Avoid standing for long periods of time Elevate legs to the level of the heart or above for 30 minutes daily and/or  when sitting, a frequency of: Exercise regularly Support Garment 20-30 mm/Hg pressure to: - compression stockings both legs daily Off-Loading Other: - foam callous pad right plantar callous daily Electronic Signature(s) Signed: 06/29/2019 5:14:16 PM By: Worthy Keeler PA-C Signed: 06/29/2019 5:36:50 PM By: Baruch Gouty RN, BSN Entered By: Baruch Gouty on 06/29/2019 15:33:32 -------------------------------------------------------------------------------- Problem List Details Patient Name: Date of Service: [name] 06/29/2019 2:00 PM Medical Record [id number] Patient Account Number: [id number] Date of Birth/Sex: Treating RN: 08-Jan-1946 (74 y.o. [name], Linda Primary Care Provider: [name] Other Clinician: Referring Provider: Treating Provider/Extender:Stone III, Lemar Livings, Abelino Derrick in Treatment: 6 Active Problems ICD-10 Evaluated Encounter Code Description Active Date Today Diagnosis I87.2 Venous insufficiency (chronic) (peripheral) 05/18/2019 No Yes I89.0 Lymphedema, not elsewhere classified 05/18/2019 No Yes L97.812 Non-pressure chronic ulcer of other part of right lower 05/18/2019 No Yes leg with fat layer exposed L97.822 Non-pressure chronic ulcer of other part of left lower 05/18/2019 No Yes leg with fat layer exposed E11.621 Type 2 diabetes mellitus with foot ulcer 05/18/2019 No Yes L97.512 Non-pressure chronic ulcer of other part of right foot 05/18/2019 No Yes with fat layer exposed I73.89 Other specified peripheral vascular diseases 05/18/2019 No Yes N18.6 End stage renal disease 05/18/2019 No Yes Z99.2 Dependence on renal dialysis 05/18/2019 No Yes I10 Essential (primary) hypertension 05/18/2019 No Yes Inactive Problems Resolved Problems Electronic Signature(s) Signed: 06/29/2019 3:15:40 PM By: Worthy Keeler PA-C Entered By: Worthy Keeler on 06/29/2019  15:15:39 -------------------------------------------------------------------------------- Progress Note Details Patient Name: Date of Service: [name] 06/29/2019 2:00 PM Medical Record [id number] Patient Account Number: [id number] Date of Birth/Sex: Treating RN: 04/23/1945 (74 y.o. [name] Primary Care Provider: [name] Other Clinician: Referring Provider: Treating Provider/Extender:Stone III, Lemar Livings, Lendon Ka Weeks in Treatment: 6 Subjective Chief Complaint Information obtained from Patient Bilateral LE ulcers and Right plantar foot ulcer History of Present Illness (HPI) 02/23/2019 on evaluation today patient presents for initial evaluation in our clinic concerning issues that she has been having with her bilateral lower extremities as well as her plantar foot on the right. This has been quite some time in regard to the foot ulcer even back as far as the beginning of this year that is 2020. She has had the wounds on her legs a much shorter time but that still has been roughly 2-3 months. Nonetheless she is really not shown signs of a lot of improvement. She tells me that she does have a past medical history of diabetes, venous insufficiency, lymphedema which is mild but nonetheless present, peripheral vascular disease and she has previously been recommended to have a femoral-popliteal bypass but put that off at that point they are basically just monitoring at this time. She has end-stage renal disease with dependence on renal dialysis, hypertension, and obviously wounds of the bilateral lower extremities at this point. She tells me that she is having some discomfort but fortunately nothing too significant at this point which is good news. No fevers, chills, nausea, vomiting, or diarrhea. She is mainly been leaving these areas  open to air as much as possible at this point at 1 point she was trying to keep them covered more but that really did  not help either. Fortunately there is no signs of active systemic nor local infection at this time. 03/02/2019 on evaluation today patient appears to be doing well with regard to her wounds. The leg ulcers which were very dry are much more moist at this point and seem to be doing great at this time. Overall I am very pleased with how things seem to be progressing. With regard to her foot ulcer it is showing some signs of slight improvement in my opinion overall there does not appear to be anything worse at least which is also good news. No fevers, chills, nausea, vomiting, or diarrhea. 03/09/2019 on evaluation today patient appears to be doing better with regard to her lower extremity ulcers a lot of the necrotic tissue is loosening up which is good news. In regard to her right plantar foot ulcer this appears to be roughly the same based on what I am seeing currently although there is less callus buildup. Unfortunately she does have an open wound area on the left lateral foot region which unfortunately probes down to bone once this was thoroughly examined. This is something she did not even know was opened she had had a Band-Aid over it just for protection last couple times I saw her. 03/16/19 evaluation today patient appears to be doing Someone poorly in regard to her left lower extremity. This seems to be likely infected based on what Ioom seeing at this point. Thereoos no signs of active infection at this time systemically but nonetheless thereoos a lot more erythema of the foot in general unfortunately. Her x-rays were reviewed and did not show any evidence of infection of the right foot. On the left there was a A little bit more going on but no obvious evidence of osteomyelitis though it was mentioned that if that was still a concern that number I would be the best way to further evaluate this. I think they were going to need to proceed with MRI to be honest based on what Ioom seeing  today. 03/23/2019 on evaluation today patient actually appears to be doing worse compared to even last week's visit with regard to her wounds especially the left foot but even the right lower extremity. Both are measuring deeper as far as the overall depth of the wound on the left foot this also seems to have spread as far as the open wound location. It also does appear to me that the cellulitis has spread further up her leg which also has me very concerned at this point. There fortunately is no signs of active infection at this point systemically such as evidence of sepsis but nonetheless I am still concerned about the fact that again the patient does not seem to be turning around. We did have her on IV vancomycin through dialysis which they graciously help this with as well. With that being said it appears based on her culture results that I did review today that cefepime or something of the like would likely be a better option the vancomycin is not likely to help with the Serratia species that was identified. 03/28/19 Fem-Pop 04/06/19 Amp toe 40month follow-up with vascular in april 05/18/2019 on evaluation today patient presents for reevaluation here in the clinic after having been sent to the ER by myself December 2020 due to overall worsening of her condition. She did  have a femoral-popliteal bypass performed on 03/28/2019. 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 Subsequently she does have a 28-month follow-up with vascular in April but seems to be doing well from a vascular standpoint at this time. Overall the main issue for which she comes in today she does have a area on the plantar aspect of her right foot where she does have a callus there may or may not be a wound under this region. Subsequently she does have several small ulcerations on the bilateral lower extremities that are more venous stasis in nature. Continue to  these do need to be addressed today as well. Fortunately there is no signs of active infection at this time. 05/25/2019 upon evaluation today patient appears to be doing well in regard to her lower extremities. Fortunately there is no signs of active infection at this time. No fever chills noted. She has been tolerating the dressing changes without complication. Fortunately she is doing much better in regard to her plantar foot in fact this appears to be completely healed which is excellent news. 06/01/2019 on evaluation today patient actually appears to be doing very well in regard to her wounds at this point. Fortunately there is no signs of active infection. No fevers, chills, nausea, vomiting, or diarrhea. 06/08/2019 upon evaluation today patient actually appears to be doing great in regard to her right lower extremity there are no open wounds remaining and she has done extremely well. Unfortunately she has several blistered areas that opened up on the left lower extremity in the interim between last week and this week. She has not been wearing any compression she states that I never told her anything about wearing compression. With that being said this is something that we have discussed in the past and it is something that we wanted her to do and again I did remind her of that today as well. 06/15/2019 upon evaluation today patient appears to be doing well with regard to her wound she does have some dressing material stuck to the surface of her wounds apparently they dried out and healed fairly quickly causing this to stick. Fortunately we should be able to clean this off but again that is one of the main issues that I see at this point. Fortunately there is no signs of active infection at this time. No fevers, chills, nausea, vomiting, or diarrhea. 06/22/2019 upon evaluation today patient appears to be doing very well in regard to her wounds. She has been tolerating the dressing changes without  complication. Fortunately there is no signs of active infection at this time. No fevers, chills, nausea, vomiting, or diarrhea. In fact the right leg appears to be completely healed she does have 1 pair of the compression socks with her 15-20 mmHg. Subsequently her left leg is doing much better although not completely healed she still has 1 area open at this point. 06/29/2019 on evaluation today patient appears to be doing excellent in regard to her left lower extremity. There is no signs of active infection at this time the wounds appear to be completely healed and overall she is doing excellent. Objective Constitutional Well-nourished and well-hydrated in no acute distress. Vitals Time Taken: 2:50 PM, Height: 64 in, Weight: 160 lbs, BMI: 27.5, Temperature: 97.8 F, Pulse: 87 bpm, Respiratory Rate: 18 breaths/min, Blood Pressure: 141/61 mmHg, Capillary Blood Glucose: 107 mg/dl. Respiratory normal breathing without difficulty. Psychiatric this patient is able to make decisions  and demonstrates good insight into disease process. Alert and Oriented x 3. pleasant and cooperative. General Notes: Patient's wound bed again showed signs of complete epithelization there is no open wounds at any site there was some dry skin I was able to gently get the soft no open areas underneath any of the dry skin patches. This is great news and the patient is very pleased to see this. Integumentary (Hair, Skin) Wound #15 status is Healed - Epithelialized. Original cause of wound was Blister. The wound is located on the Left,Posterior Lower Leg. The wound measures 0cm length x 0cm width x 0cm depth; 0cm^2 area and 0cm^3 volume. There is no tunneling or undermining noted. There is a none present amount of drainage noted. The wound margin is distinct with the outline attached to the wound base. There is no granulation within the wound bed. There is no necrotic tissue within the wound bed. Assessment Active  Problems ICD-10 Venous insufficiency (chronic) (peripheral) Lymphedema, not elsewhere classified Non-pressure chronic ulcer of other part of right lower leg with fat layer exposed Non-pressure chronic ulcer of other part of left lower leg with fat layer exposed Type 2 diabetes mellitus with foot ulcer Non-pressure chronic ulcer of 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d Other specified peripheral vascular diseases End stage renal disease Dependence on renal dialysis Essential (primary) hypertension Plan Discharge From [facility] Services: Discharge from [place] Barriers/Peri-Wound Care: Moisturizing lotion - both legs daily Edema Control: Avoid standing for long periods of time Elevate legs to the level of the heart or above for 30 minutes daily and/or when sitting, a frequency of: Exercise regularly Support Garment 20-30 mm/Hg pressure to: - compression stockings both legs daily Off-Loading: Other: - foam callous pad right plantar callous daily 1. I would recommend currently that we discontinue wound care services as the patient is completely healed. 2. I am also can recommend at this time that the patient needs to wear her own compression stockings at home. This needs to be from the time she gets up the morning to when she goes to bed at night. 3. I am also can recommend that she avoid standing long periods of time as well as elevating her legs. We will see patient back for reevaluation in 1 week here in the clinic. If anything worsens or changes patient will contact our office for additional recommendations. Electronic Signature(s) Signed: 06/29/2019 3:36:13 PM By: Worthy Keeler PA-C Entered By: Worthy Keeler on 06/29/2019 15:36:13 -------------------------------------------------------------------------------- SuperBill Details Patient Name: Date of Service: Pamela, [name] 06/29/2019 Medical Record [id number] Patient Account Number: [id number] Date  of Birth/Sex: Treating RN: 07/06/1945 (74 y.o. [name] Primary Care Provider: [name] Other Clinician: Referring Provider: Treating Provider/Extender:Stone III, Lemar Livings, Lendon Ka Weeks in Treatment: 6 Diagnosis Coding ICD-10 Codes Code Description I87.2 Venous insufficiency (chronic) (peripheral) I89.0 Lymphedema, not elsewhere classified L97.812 Non-pressure chronic ulcer of other part of right lower leg with fat layer exposed L97.822 Non-pressure chronic ulcer of other part of left lower leg with fat layer exposed E11.621 Type 2 diabetes mellitus with foot ulcer L97.512 Non-pressure chronic ulcer of 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d I73.89 Other specified peripheral vascular diseases N18.6 End stage renal disease Z99.2 Dependence on renal dialysis I10 Essential (primary) hypertension Facility Procedures CPT4 Code: [id number] Description: 61443 - WOUND CARE VISIT-LEV 2 EST PT Modifier: Quantity: 1 Physician Procedures Electronic Signature(s) Signed: 06/29/2019 3:36:26 PM By: Worthy Keeler PA-C  Entered By: Worthy Keeler on 06/29/2019 15:36:26

## 2019-06-30 DIAGNOSIS — N186 End stage renal disease: Secondary | ICD-10-CM | POA: Diagnosis not present

## 2019-06-30 DIAGNOSIS — D509 Iron deficiency anemia, unspecified: Secondary | ICD-10-CM | POA: Diagnosis not present

## 2019-06-30 DIAGNOSIS — N2581 Secondary hyperparathyroidism of renal origin: Secondary | ICD-10-CM | POA: Diagnosis not present

## 2019-06-30 DIAGNOSIS — Z992 Dependence on renal dialysis: Secondary | ICD-10-CM | POA: Diagnosis not present

## 2019-06-30 DIAGNOSIS — D631 Anemia in chronic kidney disease: Secondary | ICD-10-CM | POA: Diagnosis not present

## 2019-07-02 DIAGNOSIS — Z992 Dependence on renal dialysis: Secondary | ICD-10-CM | POA: Diagnosis not present

## 2019-07-02 DIAGNOSIS — D631 Anemia in chronic kidney disease: Secondary | ICD-10-CM | POA: Diagnosis not present

## 2019-07-02 DIAGNOSIS — N2581 Secondary hyperparathyroidism of renal origin: Secondary | ICD-10-CM | POA: Diagnosis not present

## 2019-07-02 DIAGNOSIS — N186 End stage renal disease: Secondary | ICD-10-CM | POA: Diagnosis not present

## 2019-07-02 DIAGNOSIS — D509 Iron deficiency anemia, unspecified: Secondary | ICD-10-CM | POA: Diagnosis not present

## 2019-07-05 DIAGNOSIS — D631 Anemia in chronic kidney disease: Secondary | ICD-10-CM | POA: Diagnosis not present

## 2019-07-05 DIAGNOSIS — Z992 Dependence on renal dialysis: Secondary | ICD-10-CM | POA: Diagnosis not present

## 2019-07-05 DIAGNOSIS — D509 Iron deficiency anemia, unspecified: Secondary | ICD-10-CM | POA: Diagnosis not present

## 2019-07-05 DIAGNOSIS — N2581 Secondary hyperparathyroidism of renal origin: Secondary | ICD-10-CM | POA: Diagnosis not present

## 2019-07-05 DIAGNOSIS — N186 End stage renal disease: Secondary | ICD-10-CM | POA: Diagnosis not present

## 2019-07-07 DIAGNOSIS — Z23 Encounter for immunization: Secondary | ICD-10-CM | POA: Diagnosis not present

## 2019-07-07 DIAGNOSIS — D509 Iron deficiency anemia, unspecified: Secondary | ICD-10-CM | POA: Diagnosis not present

## 2019-07-07 DIAGNOSIS — N2581 Secondary hyperparathyroidism of renal origin: Secondary | ICD-10-CM | POA: Diagnosis not present

## 2019-07-07 DIAGNOSIS — N186 End stage renal disease: Secondary | ICD-10-CM | POA: Diagnosis not present

## 2019-07-07 DIAGNOSIS — E876 Hypokalemia: Secondary | ICD-10-CM | POA: Diagnosis not present

## 2019-07-07 DIAGNOSIS — E1122 Type 2 diabetes mellitus with diabetic chronic kidney disease: Secondary | ICD-10-CM | POA: Diagnosis not present

## 2019-07-07 DIAGNOSIS — Z992 Dependence on renal dialysis: Secondary | ICD-10-CM | POA: Diagnosis not present

## 2019-07-07 DIAGNOSIS — D631 Anemia in chronic kidney disease: Secondary | ICD-10-CM | POA: Diagnosis not present

## 2019-07-09 DIAGNOSIS — N2581 Secondary hyperparathyroidism of renal origin: Secondary | ICD-10-CM | POA: Diagnosis not present

## 2019-07-09 DIAGNOSIS — D631 Anemia in chronic kidney disease: Secondary | ICD-10-CM | POA: Diagnosis not present

## 2019-07-09 DIAGNOSIS — N186 End stage renal disease: Secondary | ICD-10-CM | POA: Diagnosis not present

## 2019-07-09 DIAGNOSIS — Z992 Dependence on renal dialysis: Secondary | ICD-10-CM | POA: Diagnosis not present

## 2019-07-09 DIAGNOSIS — E876 Hypokalemia: Secondary | ICD-10-CM | POA: Diagnosis not present

## 2019-07-09 DIAGNOSIS — D509 Iron deficiency anemia, unspecified: Secondary | ICD-10-CM | POA: Diagnosis not present

## 2019-07-12 DIAGNOSIS — Z992 Dependence on renal dialysis: Secondary | ICD-10-CM | POA: Diagnosis not present

## 2019-07-12 DIAGNOSIS — D509 Iron deficiency anemia, unspecified: Secondary | ICD-10-CM | POA: Diagnosis not present

## 2019-07-12 DIAGNOSIS — N186 End stage renal disease: Secondary | ICD-10-CM | POA: Diagnosis not present

## 2019-07-12 DIAGNOSIS — D631 Anemia in chronic kidney disease: Secondary | ICD-10-CM | POA: Diagnosis not present

## 2019-07-12 DIAGNOSIS — N2581 Secondary hyperparathyroidism of renal origin: Secondary | ICD-10-CM | POA: Diagnosis not present

## 2019-07-12 DIAGNOSIS — E876 Hypokalemia: Secondary | ICD-10-CM | POA: Diagnosis not present

## 2019-07-14 DIAGNOSIS — E876 Hypokalemia: Secondary | ICD-10-CM | POA: Diagnosis not present

## 2019-07-14 DIAGNOSIS — D631 Anemia in chronic kidney disease: Secondary | ICD-10-CM | POA: Diagnosis not present

## 2019-07-14 DIAGNOSIS — D509 Iron deficiency anemia, unspecified: Secondary | ICD-10-CM | POA: Diagnosis not present

## 2019-07-14 DIAGNOSIS — Z992 Dependence on renal dialysis: Secondary | ICD-10-CM | POA: Diagnosis not present

## 2019-07-14 DIAGNOSIS — N2581 Secondary hyperparathyroidism of renal origin: Secondary | ICD-10-CM | POA: Diagnosis not present

## 2019-07-14 DIAGNOSIS — N186 End stage renal disease: Secondary | ICD-10-CM | POA: Diagnosis not present

## 2019-07-16 DIAGNOSIS — D631 Anemia in chronic kidney disease: Secondary | ICD-10-CM | POA: Diagnosis not present

## 2019-07-16 DIAGNOSIS — Z992 Dependence on renal dialysis: Secondary | ICD-10-CM | POA: Diagnosis not present

## 2019-07-16 DIAGNOSIS — N186 End stage renal disease: Secondary | ICD-10-CM | POA: Diagnosis not present

## 2019-07-16 DIAGNOSIS — D509 Iron deficiency anemia, unspecified: Secondary | ICD-10-CM | POA: Diagnosis not present

## 2019-07-16 DIAGNOSIS — N2581 Secondary hyperparathyroidism of renal origin: Secondary | ICD-10-CM | POA: Diagnosis not present

## 2019-07-16 DIAGNOSIS — E876 Hypokalemia: Secondary | ICD-10-CM | POA: Diagnosis not present

## 2019-07-18 ENCOUNTER — Other Ambulatory Visit: Payer: Self-pay

## 2019-07-18 ENCOUNTER — Encounter: Payer: Self-pay | Admitting: Podiatry

## 2019-07-18 ENCOUNTER — Ambulatory Visit (INDEPENDENT_AMBULATORY_CARE_PROVIDER_SITE_OTHER): Payer: Medicare Other | Admitting: Podiatry

## 2019-07-18 VITALS — Temp 98.3°F

## 2019-07-18 DIAGNOSIS — E1149 Type 2 diabetes mellitus with other diabetic neurological complication: Secondary | ICD-10-CM | POA: Diagnosis not present

## 2019-07-18 DIAGNOSIS — I739 Peripheral vascular disease, unspecified: Secondary | ICD-10-CM | POA: Diagnosis not present

## 2019-07-18 DIAGNOSIS — L84 Corns and callosities: Secondary | ICD-10-CM

## 2019-07-18 DIAGNOSIS — B351 Tinea unguium: Secondary | ICD-10-CM

## 2019-07-18 DIAGNOSIS — M79674 Pain in right toe(s): Secondary | ICD-10-CM | POA: Diagnosis not present

## 2019-07-18 DIAGNOSIS — M79675 Pain in left toe(s): Secondary | ICD-10-CM

## 2019-07-19 DIAGNOSIS — D509 Iron deficiency anemia, unspecified: Secondary | ICD-10-CM | POA: Diagnosis not present

## 2019-07-19 DIAGNOSIS — D631 Anemia in chronic kidney disease: Secondary | ICD-10-CM | POA: Diagnosis not present

## 2019-07-19 DIAGNOSIS — N2581 Secondary hyperparathyroidism of renal origin: Secondary | ICD-10-CM | POA: Diagnosis not present

## 2019-07-19 DIAGNOSIS — E876 Hypokalemia: Secondary | ICD-10-CM | POA: Diagnosis not present

## 2019-07-19 DIAGNOSIS — Z992 Dependence on renal dialysis: Secondary | ICD-10-CM | POA: Diagnosis not present

## 2019-07-19 DIAGNOSIS — N186 End stage renal disease: Secondary | ICD-10-CM | POA: Diagnosis not present

## 2019-07-20 NOTE — Progress Notes (Signed)
Pamela, [name] ([id number]) Visit Report for 05/25/2019 Arrival Information Details Patient Name: Date of Service: [name], [name] 05/25/2019 1:15 PM Medical Record [id number] Patient Account Number: [id number] Date of Birth/Sex: Treating RN: 1946/02/21 (74 y.o. [name], [name] Primary Care Brittnay Pigman: [name] Other Clinician: Referring Kunta Hilleary: Treating Estaban [place]/Extender:Stone III, Lemar Livings, Abelino Derrick in Treatment: 1 Visit Information History Since Last Visit Added or deleted any medications: No Patient Arrived: [name] Any new allergies or adverse reactions: No Arrival Time: 13:58 Had a fall or experienced change in No Accompanied By: self activities of daily living that may affect Transfer Assistance: None risk of falls: Patient Identification Verified: Yes Signs or symptoms of abuse/neglect since last No Secondary Verification Process Yes visito Completed: Hospitalized since last visit: No Patient Has Alerts: Yes Implantable device outside of the clinic excluding No Patient Alerts: R ABI non cellular tissue based products placed in the center compressible since last visit: L ABI non Has Dressing in Place as Prescribed: Yes compressible Pain Present Now: Yes Electronic Signature(s) Signed: 07/20/2019 9:23:26 AM By: Sandre Kitty Entered By: Sandre Kitty on 05/25/2019 14:00:51 -------------------------------------------------------------------------------- Clinic Level of Care Assessment Details Patient Name: Date of Service: [name], [name] 05/25/2019 1:15 PM Medical Record [id number] Patient Account Number: [id number] Date of Birth/Sex: Treating RN: February 05, 1946 (74 y.o. [name] Primary Care Phillip Maffei: [name] Other Clinician: Referring Trinidee Schrag: Treating [name]/Extender:Stone III, Lemar Livings, Abelino Derrick in Treatment: 1 Clinic Level of Care Assessment Items TOOL 4 Quantity Score []  - Use  when only an EandM is performed on FOLLOW-UP visit 0 ASSESSMENTS - Nursing Assessment / Reassessment X - Reassessment of Co-morbidities (includes updates in patient status) 1 10 X - Reassessment of Adherence to Treatment Plan 1 5 ASSESSMENTS - Wound and Skin Assessment / Reassessment []  - Simple Wound Assessment / Reassessment - one wound 0 X - Complex Wound Assessment / Reassessment - multiple wounds 3 5 []  - Dermatologic / Skin Assessment (not related to wound area) 0 ASSESSMENTS - Focused Assessment X - Circumferential Edema Measurements - multi extremities 3 5 []  - Nutritional Assessment / Counseling / Intervention 0 X - Lower Extremity Assessment (monofilament, tuning fork, pulses) 1 5 []  - Peripheral Arterial Disease Assessment (using hand held doppler) 0 ASSESSMENTS - Ostomy and/or Continence Assessment and Care []  - Incontinence Assessment and Management 0 []  - Ostomy Care Assessment and Management (repouching, etc.) 0 PROCESS - Coordination of Care X - Simple Patient / Family Education for ongoing care 1 15 []  - Complex (extensive) Patient / Family Education for ongoing care 0 X - Staff obtains Programmer, systems, Records, Test Results / Process Orders 1 10 X - Staff telephones HHA, Nursing Homes / Clarify orders / etc 1 10 []  - Routine Transfer to another Facility (non-emergent condition) 0 []  - Routine Hospital Admission (non-emergent condition) 0 []  - New Admissions / Biomedical engineer / Ordering NPWT, Apligraf, etc. 0 []  - Emergency Hospital Admission (emergent condition) 0 X - Simple Discharge Coordination 1 10 []  - Complex (extensive) Discharge Coordination 0 PROCESS - Special Needs []  - Pediatric / Minor Patient Management 0 []  - Isolation Patient Management 0 []  - Hearing / Language / Visual special needs 0 []  - Assessment of Community assistance (transportation, D/C planning, etc.) 0 []  - Additional assistance / Altered mentation 0 []  - Support Surface(s) Assessment  (bed, cushion, seat, etc.) 0 INTERVENTIONS - Wound Cleansing / Measurement []  - Simple Wound Cleansing - one wound 0 X - Complex  Wound Cleansing - multiple wounds 3 5 X - Wound Imaging (photographs - any number of wounds) 1 5 []  - Wound Tracing (instead of photographs) 0 []  - Simple Wound Measurement - one wound 0 X - Complex Wound Measurement - multiple wounds 3 5 INTERVENTIONS - Wound Dressings []  - Small Wound Dressing one or multiple wounds 0 X - Medium Wound Dressing one or multiple wounds 2 15 []  - Large Wound Dressing one or multiple wounds 0 X - Application of Medications - topical 1 5 []  - Application of Medications - injection 0 INTERVENTIONS - Miscellaneous []  - External ear exam 0 []  - Specimen Collection (cultures, biopsies, blood, body fluids, etc.) 0 []  - Specimen(s) / Culture(s) sent or taken to Lab for analysis 0 []  - Patient Transfer (multiple staff / Civil Service fast streamer / Similar devices) 0 []  - Simple Staple / Suture removal (25 or less) 0 []  - Complex Staple / Suture removal (26 or more) 0 []  - Hypo / Hyperglycemic Management (close monitor of Blood Glucose) 0 []  - Ankle / Brachial Index (ABI) - do not check if billed separately 0 X - Vital Signs 1 5 Has the patient been seen at the hospital within the last three years: Yes Total Score: 170 Level Of Care: New/Established - Level 5 Electronic Signature(s) Signed: 05/27/2019 6:14:01 PM By: Baruch Gouty RN, BSN Entered By: Baruch Gouty on 05/25/2019 15:08:57 -------------------------------------------------------------------------------- Lower Extremity Assessment Details Patient Name: Date of Service: [name] 05/25/2019 1:15 PM Medical Record [id number] Patient Account Number: [id number] Date of Birth/Sex: Treating RN: [birth year] (74 y.o. Debby Bud Primary Care Stephaniemarie Stoffel: [name] Other Clinician: Referring Brayden Brodhead: Treating [name]/Extender:Stone III, Lemar Livings, Lendon Ka Weeks  in Treatment: 1 Edema Assessment Assessed: [Left: Yes] [Right: Yes] Edema: [Left: Yes] [Right: Yes] Calf Left: Right: Point of Measurement: 31 cm From Medial Instep 31 cm 28.5 cm Ankle Left: Right: Point of Measurement: 11 cm From Medial Instep 20 cm 19 cm Vascular Assessment Pulses: Dorsalis Pedis Palpable: [Left:Yes] [Right:Yes] Electronic Signature(s) Signed: 05/25/2019 5:44:15 PM By: Deon Pilling Entered By: Deon Pilling on 05/25/2019 14:06:24 -------------------------------------------------------------------------------- [place] Details Patient Name: Date of Service: [name] 05/25/2019 1:15 PM Medical Record [id number] Patient Account Number: [id number] Date of Birth/Sex: Treating RN: 1946-03-17 (74 y.o. [name] Primary Care Zorana Brockwell: [name] Other Clinician: Referring Quentyn Kolbeck: Treating Phillippe Orlick/Extender:Stone III, Lemar Livings, Abelino Derrick in Treatment: 1 Active Inactive Nutrition Nursing Diagnoses: Impaired glucose control: actual or potential Potential for alteratiion in Nutrition/Potential for imbalanced nutrition Goals: Patient/caregiver will maintain therapeutic glucose control Date Initiated: 05/18/2019 Target Resolution Date: 06/15/2019 Goal Status: Active Interventions: Assess HgA1c results as ordered upon admission and as needed Assess patient nutrition upon admission and as needed per policy Treatment Activities: Patient referred to Primary Care Physician for further nutritional evaluation : 05/18/2019 Notes: Wound/Skin Impairment Nursing Diagnoses: Impaired tissue integrity Knowledge deficit related to ulceration/compromised skin integrity Goals: Patient/caregiver will verbalize understanding of skin care regimen Date Initiated: 05/18/2019 Target Resolution Date: 06/15/2019 Goal Status: Active Ulcer/skin breakdown will have a volume reduction of 30% by week 4 Date Initiated:  05/18/2019 Target Resolution Date: 06/15/2019 Goal Status: Active Interventions: Assess patient/caregiver ability to obtain necessary supplies Assess patient/caregiver ability to perform ulcer/skin care regimen upon admission and as needed Assess ulceration(s) every visit Provide education on ulcer and skin care Treatment Activities: Skin care regimen initiated : 05/18/2019 Topical wound management initiated : 05/18/2019 Notes: Electronic Signature(s) Signed: 05/27/2019 6:14:01 PM By:  Baruch Gouty RN, BSN Entered By: Baruch Gouty on 05/25/2019 14:03:10 -------------------------------------------------------------------------------- Pain Assessment Details Patient Name: Date of Service: [name], [name] 05/25/2019 1:15 PM Medical Record [id number] Patient Account Number: [id number] Date of Birth/Sex: Treating RN: Oct 31, 1945 (74 y.o. [name] Primary Care Mujahid Jalomo: Other Clinician: [name] Referring Zoa Dowty: Treating Zeeva Courser/Extender:Stone III, Lemar Livings, Lendon Ka Weeks in Treatment: 1 Active Problems Location of Pain Severity and Description of Pain Patient Has Paino Yes Site Locations With Dressing Change: Yes Rate the pain. Current Pain Level: 3 Character of Pain Pain Management and Medication Current Pain Management: Electronic Signature(s) Signed: 05/27/2019 6:14:01 PM By: Baruch Gouty RN, BSN Signed: 07/20/2019 9:23:26 AM By: Sandre Kitty Entered By: Sandre Kitty on 05/25/2019 14:01:26 -------------------------------------------------------------------------------- Patient/Caregiver Education Details Patient Name: Date of Service: [name] 2/17/2021andnbsp1:15 PM Medical Record [id number] Patient Account Number: [id number] Date of Birth/Gender: Treating RN: [birth year] (74 y.o. [name] Primary Care Physician: [name] Other Clinician: Referring Physician: Treating  Physician/Extender:Stone III, Lemar Livings, Abelino Derrick in Treatment: 1 Education Assessment Education Provided To: Patient Education Topics Provided Venous: Methods: Explain/Verbal Responses: Reinforcements needed, State content correctly Wound/Skin Impairment: Methods: Explain/Verbal Responses: Reinforcements needed, State content correctly Electronic Signature(s) Signed: 05/27/2019 6:14:01 PM By: Baruch Gouty RN, BSN Entered By: Baruch Gouty on 05/25/2019 14:57:54 -------------------------------------------------------------------------------- Wound Assessment Details Patient Name: Date of Service: [name] 05/25/2019 1:15 PM Medical Record [id number] Patient Account Number: [id number] Date of Birth/Sex: Treating RN: [birth year] (74 y.o. [name], [name] Primary Care Saleema Weppler: [name] Other Clinician: Referring Karely Hurtado: Treating Berman Grainger/Extender:Stone III, Lemar Livings, Lendon Ka Weeks in Treatment: 1 Wound Status Wound Number: 10 Primary Venous Leg Ulcer Etiology: Wound Location: Left Lower Leg - Medial Wound Open Wounding Event: Gradually Appeared Status: Date Acquired: 05/18/2019 Comorbid Cataracts, Anemia, Asthma, Congestive Heart Weeks Of Treatment: 1 History: Failure, Coronary Artery Disease, Clustered Wound: Yes Hypertension, Peripheral Arterial Disease, Peripheral Venous Disease, Type II Diabetes, End Stage Renal Disease, Osteoarthritis, Osteomyelitis, Neuropathy, Received Radiation Photos Wound Measurements Length: (cm) 1.4 Width: (cm) 0.3 Depth: (cm) 0.1 Clustered Quantity: 1 Area: (cm) 0.33 Volume: (cm) 0.033 Wound Description Classification: Partial Thickness Wound Margin: Flat and Intact Exudate Amount: Small Exudate Type: Serosanguineous Exudate Color: red, brown Wound Bed Granulation Amount: Large (67-100%) Granulation Quality: Pink Necrotic Amount: None Present (0%) After Cleansing: No rino  No Exposed Structure sed: No Subcutaneous Tissue) Exposed: No sed: No sed: No ed: No d: No % Reduction in Area: 70% % Reduction in Volume: 70% Epithelialization: None Tunneling: No Undermining: No Foul Odor Slough/Fib Fascia Expo Fat Layer ( Tendon Expo Muscle Expo Joint Expos Bone Expose Electronic Signature(s) Signed: 05/27/2019 5:06:31 PM By: Mikeal Hawthorne EMT/HBOT Signed: 05/27/2019 6:14:01 PM By: Baruch Gouty RN, BSN Entered By: Mikeal Hawthorne on 05/27/2019 14:31:08 -------------------------------------------------------------------------------- Wound Assessment Details Patient Name: Date of Service: [name] 05/25/2019 1:15 PM Medical Record [id number] Patient Account Number: [id number] Date of Birth/Sex: Treating RN: April 12, 1945 (74 y.o. [name] Primary Care Thomasine Klutts: [name] Other Clinician: Referring Erlinda Solinger: Treating Savilla Turbyfill/Extender:Stone III, Lemar Livings, Lendon Ka Weeks in Treatment: 1 Wound Status Wound Number: 11 Primary Diabetic Wound/Ulcer of the Lower Extremity Etiology: Wound Location: Right Foot - Plantar Wound Healed - Epithelialized Wounding Event: Gradually Appeared Status: Date Acquired: 05/18/2019 Comorbid Cataracts, Anemia, Asthma, Congestive Heart Weeks Of Treatment: 1 History: Failure, Coronary Artery Disease, Clustered Wound: No Hypertension, Peripheral Arterial Disease, Peripheral Venous Disease, Type II Diabetes, End Stage Renal Disease, Osteoarthritis, Osteomyelitis, Neuropathy,  Received Radiation Photos Wound Measurements Length: (cm) 0 % Reduction Width: (cm) 0 % Reduction Depth: (cm) 0 Epitheliali Area: (cm) 0 Tunneling: Volume: (cm) 0 Underminin Wound Description Classification: Grade 1 Foul Odor A Wound Margin: Flat and Intact Slough/Fibr Exudate Amount: Small Exudate Type: Serous Exudate Color: amber Wound Bed Granulation Amount: Large (67-100%) Granulation Quality: Red  Fascia Expo Necrotic Amount: None Present (0%) Fat Layer ( Tendon Expo Muscle Expo Joint Expos Bone Expose fter Cleansing: No ino No Exposed Structure sed: No Subcutaneous Tissue) Exposed: Yes sed: No sed: No ed: No d: No in Area: 43.6% in Volume: 40% zation: Large (67-100%) No g: No Electronic Signature(s) Signed: 05/27/2019 5:06:31 PM By: Mikeal Hawthorne EMT/HBOT Signed: 05/27/2019 6:14:01 PM By: Baruch Gouty RN, BSN Entered By: Mikeal Hawthorne on 05/27/2019 14:33:44 -------------------------------------------------------------------------------- Wound Assessment Details Patient Name: Date of Service: [name] 05/25/2019 1:15 PM Medical Record [id number] Patient Account Number: [id number] Date of Birth/Sex: Treating RN: 02/07/1946 (74 y.o. [name], [name] Primary Care Dmonte Maher: [name] Other Clinician: Referring Sueko Dimichele: Treating Nafeesah Lapaglia/Extender:Stone III, Lemar Livings, Lendon Ka Weeks in Treatment: 1 Wound Status Wound Number: 8 Primary Venous Leg Ulcer Etiology: Wound Location: Right Lower Leg - Anterior Wound Open Wounding Event: Gradually Appeared Status: Date Acquired: 12/07/2018 Comorbid Cataracts, Anemia, Asthma, Congestive Heart Weeks Of Treatment: 1 History: Failure, Coronary Artery Disease, Clustered Wound: No Hypertension, Peripheral Arterial Disease, Peripheral Venous Disease, Type II Diabetes, End Stage Renal Disease, Osteoarthritis, Osteomyelitis, Neuropathy, Received Radiation Photos Wound Measurements Length: (cm) 1.2 % Reduct Width: (cm) 0.7 % Reduct Depth: (cm) 0.1 Epitheli Area: (cm) 0.66 Tunneli Volume: (cm) 0.066 Undermi Wound Description Classification: Full Thickness Without Exposed Support Foul Od Structures Slough/ Wound Flat and Intact Margin: Exudate Medium Amount: Exudate Serosanguineous Type: Exudate red, brown Color: Wound Bed Granulation Amount: Large (67-100%) Granulation Quality:  Red Fascia E Necrotic Amount: None Present (0%) Fat Laye Tendon E Muscle E Joint Ex Bone Exp or After Cleansing: No Fibrino No Exposed Structure xposed: No r (Subcutaneous Tissue) Exposed: Yes xposed: No xposed: No posed: No osed: No ion in Area: 22.2% ion in Volume: 22.4% alization: Large (67-100%) ng: No ning: No Electronic Signature(s) Signed: 05/27/2019 5:06:31 PM By: Mikeal Hawthorne EMT/HBOT Signed: 05/27/2019 6:14:01 PM By: Baruch Gouty RN, BSN Entered By: Mikeal Hawthorne on 05/27/2019 14:34:40 -------------------------------------------------------------------------------- Wound Assessment Details Patient Name: Date of Service: [name] 05/25/2019 1:15 PM Medical Record [id number] Patient Account Number: [id number] Date of Birth/Sex: Treating RN: [birth year] (74 y.o. [name], [name] Primary Care Tacori Kvamme: [name] Other Clinician: Referring Deshonna Trnka: Treating Jabree Pernice/Extender:Stone III, Lemar Livings, Lendon Ka Weeks in Treatment: 1 Wound Status Wound Number: 9 Primary Venous Leg Ulcer Etiology: Wound Location: Right Lower Leg - Medial, Posterior Wound Open Status: Wounding Event: Gradually Appeared Comorbid Cataracts, Anemia, Asthma, Congestive Heart Date Acquired: 12/07/2018 History: Failure, Coronary Artery Disease, Weeks Of Treatment: 1 Hypertension, Peripheral Arterial Disease, Clustered Wound: No Peripheral Venous Disease, Type II Diabetes, End Stage Renal Disease, Osteoarthritis, Osteomyelitis, Neuropathy, Received Radiation Photos Wound Measurements Length: (cm) 0.2 % Reduct Width: (cm) 0.2 % Reduct Depth: (cm) 0.1 Epitheli Area: (cm) 0.031 Tunneli Volume: (cm) 0.003 Undermi Wound Description Full Thickness Without Exposed Support Foul Od Classification: Structures Slough/ Wound Flat and Intact Margin: Exudate Small Amount: Exudate Serosanguineous Type: Exudate red, brown Color: Wound Bed Granulation  Amount: Large (67-100%) Granulation Quality: Pink Fascia E Necrotic Amount: None Present (0%) Fat Laye Tendon E Muscle E Joint Ex Bone Exp or After Cleansing: No  Fibrino Yes Exposed Structure xposed: No r (Subcutaneous Tissue) Exposed: Yes xposed: No xposed: No posed: No osed: No ion in Area: 90.1% ion in Volume: 90.3% alization: Large (67-100%) ng: No ning: No Electronic Signature(s) Signed: 05/27/2019 5:06:31 PM By: Mikeal Hawthorne EMT/HBOT Signed: 05/27/2019 6:14:01 PM By: Baruch Gouty RN, BSN Previous Signature: 05/25/2019 5:44:15 PM Version By: Deon Pilling Entered By: Mikeal Hawthorne on 05/27/2019 14:35:06 -------------------------------------------------------------------------------- [place] Details Patient Name: Date of Service: [name] 05/25/2019 1:15 PM Medical Record [id number] Patient Account Number: [id number] Date of Birth/Sex: Treating RN: 1945-05-12 (74 y.o. [name] Primary Care Trystyn Sitts: [name] Other Clinician: Referring Keveon Amsler: Treating Marshae Azam/Extender:Stone III, Lemar Livings, Lendon Ka Weeks in Treatment: 1 Vital Signs Time Taken: 14:00 Temperature (F): 98.3 Height (in): 64 Pulse (bpm): 98 Weight (lbs): 160 Respiratory Rate (breaths/min): 16 Body Mass Index (BMI): 27.5 Blood Pressure (mmHg): 144/79 Reference Range: 80 - 120 mg / dl Electronic Signature(s) Signed: 07/20/2019 9:23:26 AM By: Sandre Kitty Entered By: Sandre Kitty on 05/25/2019 14:01:07

## 2019-07-20 NOTE — Progress Notes (Signed)
Pamela, [name] ([id number]) Visit Report for 06/08/2019 Arrival Information Details Patient Name: Date of Service: Pamela, [name] 06/08/2019 1:45 PM Medical Record [id number] Patient Account Number: [id number] Date of Birth/Sex: Treating RN: 05/27/1945 (74 y.o. [name] Primary Care Shalon Salado: [name] Other Clinician: Referring Caeley Dohrmann: Treating Nikolaos Maddocks/Extender:Stone III, Lemar Livings, Abelino Derrick in Treatment: 3 Visit Information History Since Last Visit Added or deleted any medications: No Patient Arrived: Wheel Chair Any new allergies or adverse reactions: No Arrival Time: 14:40 Had a fall or experienced change in No Accompanied By: self activities of daily living that may affect Transfer Assistance: None risk of falls: Patient Identification Verified: Yes Signs or symptoms of abuse/neglect since last No Secondary Verification Process Yes visito Completed: Hospitalized since last visit: No Patient Requires Transmission- No Implantable device outside of the clinic excluding No Based Precautions: cellular tissue based products placed in the center Patient Has Alerts: Yes since last visit: Patient Alerts: R ABI non Has Dressing in Place as Prescribed: Yes compressible Pain Present Now: No L ABI non compressible Electronic Signature(s) Signed: 07/20/2019 9:21:08 AM By: Sandre Kitty Entered By: Sandre Kitty on 06/08/2019 14:40:50 -------------------------------------------------------------------------------- Clinic Level of Care Assessment Details Patient Name: Date of Service: Pamela, [name] 06/08/2019 1:45 PM Medical Record [id number] Patient Account Number: [id number] Date of Birth/Sex: Treating RN: 05/27/1945 (74 y.o. [name] Primary Care Valaree Fresquez: [name] Other Clinician: Referring Lonnetta Kniskern: Treating Elio Haden/Extender:Stone III, Lemar Livings, Abelino Derrick in Treatment: 3 Clinic Level of  Care Assessment Items TOOL 4 Quantity Score X - Use when only an EandM is performed on FOLLOW-UP visit 1 0 ASSESSMENTS - Nursing Assessment / Reassessment X - Reassessment of Co-morbidities (includes updates in patient status) 1 10 X - Reassessment of Adherence to Treatment Plan 1 5 ASSESSMENTS - Wound and Skin Assessment / Reassessment []  - Simple Wound Assessment / Reassessment - one wound 0 X - Complex Wound Assessment / Reassessment - multiple wounds 4 5 []  - Dermatologic / Skin Assessment (not related to wound area) 0 ASSESSMENTS - Focused Assessment []  - Circumferential Edema Measurements - multi extremities 0 []  - Nutritional Assessment / Counseling / Intervention 0 X - Lower Extremity Assessment (monofilament, tuning fork, pulses) 1 5 []  - Peripheral Arterial Disease Assessment (using hand held doppler) 0 ASSESSMENTS - Ostomy and/or Continence Assessment and Care []  - Incontinence Assessment and Management 0 []  - Ostomy Care Assessment and Management (repouching, etc.) 0 PROCESS - Coordination of Care X - Simple Patient / Family Education for ongoing care 1 15 []  - Complex (extensive) Patient / Family Education for ongoing care 0 X - Staff obtains Programmer, systems, Records, Test Results / Process Orders 1 10 X - Staff telephones HHA, Nursing Homes / Clarify orders / etc 1 10 []  - Routine Transfer to another Facility (non-emergent condition) 0 []  - Routine Hospital Admission (non-emergent condition) 0 []  - New Admissions / Biomedical engineer / Ordering NPWT, Apligraf, etc. 0 []  - Emergency Hospital Admission (emergent condition) 0 X - Simple Discharge Coordination 1 10 []  - Complex (extensive) Discharge Coordination 0 PROCESS - Special Needs []  - Pediatric / Minor Patient Management 0 []  - Isolation Patient Management 0 []  - Hearing / Language / Visual special needs 0 []  - Assessment of Community assistance (transportation, D/C planning, etc.) 0 []  - Additional assistance /  Altered mentation 0 []  - Support Surface(s) Assessment (bed, cushion, seat, etc.) 0 INTERVENTIONS - Wound Cleansing / Measurement []  - Simple Wound Cleansing -  one wound 0 X - Complex Wound Cleansing - multiple wounds 4 5 X - Wound Imaging (photographs - any number of wounds) 1 5 []  - Wound Tracing (instead of photographs) 0 []  - Simple Wound Measurement - one wound 0 X - Complex Wound Measurement - multiple wounds 4 5 INTERVENTIONS - Wound Dressings []  - Small Wound Dressing one or multiple wounds 0 []  - Medium Wound Dressing one or multiple wounds 0 X - Large Wound Dressing one or multiple wounds 1 20 X - Application of Medications - topical 1 5 []  - Application of Medications - injection 0 INTERVENTIONS - Miscellaneous []  - External ear exam 0 []  - Specimen Collection (cultures, biopsies, blood, body fluids, etc.) 0 []  - Specimen(s) / Culture(s) sent or taken to Lab for analysis 0 []  - Patient Transfer (multiple staff / Civil Service fast streamer / Similar devices) 0 []  - Simple Staple / Suture removal (25 or less) 0 []  - Complex Staple / Suture removal (26 or more) 0 []  - Hypo / Hyperglycemic Management (close monitor of Blood Glucose) 0 []  - Ankle / Brachial Index (ABI) - do not check if billed separately 0 X - Vital Signs 1 5 Has the patient been seen at the hospital within the last three years: Yes Total Score: 160 Level Of Care: New/Established - Level 5 Electronic Signature(s) Signed: 06/08/2019 5:54:02 PM By: [name] RN, BSN Entered By: [name] on 06/08/2019 17:23:35 -------------------------------------------------------------------------------- Encounter Discharge Information Details Patient Name: Date of Service: [name] 06/08/2019 1:45 PM Medical Record [id number] Patient Account Number: [id number] Date of Birth/Sex: Treating RN: February 01, 1946 (74 y.o. [name] Primary Care Berdina Cheever: [name] Other Clinician: Referring Debborah Alonge: Treating  Elynor Kallenberger/Extender:Stone III, Lemar Livings, Abelino Derrick in Treatment: 3 Encounter Discharge Information Items Discharge Condition: Stable Ambulatory Status: Wheelchair Discharge Destination: Home Transportation: Private Auto Accompanied By: self Schedule Follow-up Appointment: Yes Clinical Summary of Care: Patient Declined Electronic Signature(s) Signed: 06/08/2019 5:16:33 PM By: Carlene Coria RN Entered By: Carlene Coria on 06/08/2019 16:06:16 -------------------------------------------------------------------------------- Lower Extremity Assessment Details Patient Name: Date of Service: [name], [name] 06/08/2019 1:45 PM Medical Record [id number] Patient Account Number: [id number] Date of Birth/Sex: Treating RN: 08-20-1945 (74 y.o. Debby Bud Primary Care Deandrea Vanpelt: [name] Other Clinician: Referring Kaylin Marcon: Treating Shirel Mallis/Extender:Stone III, Lemar Livings, Lendon Ka Weeks in Treatment: 3 Edema Assessment Assessed: [Left: Yes] [Right: Yes] Edema: [Left: Yes] [Right: Yes] Calf Left: Right: Point of Measurement: 31 cm From Medial Instep 33 cm 28 cm Ankle Left: Right: Point of Measurement: 11 cm From Medial Instep 21 cm 18 cm Vascular Assessment Pulses: Dorsalis Pedis Palpable: [Left:Yes] [Right:Yes] Electronic Signature(s) Signed: 06/08/2019 5:43:51 PM By: Deon Pilling Entered By: Deon Pilling on 06/08/2019 14:56:24 -------------------------------------------------------------------------------- [place] Details Patient Name: Date of Service: [name] 06/08/2019 1:45 PM Medical Record [id number] Patient Account Number: [id number] Date of Birth/Sex: Treating RN: 09-25-1945 (74 y.o. [name] Primary Care Jaking Thayer: [name] Other Clinician: Referring Carrissa Taitano: Treating Mikale Silversmith/Extender:Stone III, Lemar Livings, Abelino Derrick in Treatment: 3 Active Inactive Nutrition Nursing  Diagnoses: Impaired glucose control: actual or potential Potential for alteratiion in Nutrition/Potential for imbalanced nutrition Goals: Patient/caregiver will maintain therapeutic glucose control Date Initiated: 05/18/2019 Target Resolution Date: 06/15/2019 Goal Status: Active Interventions: Assess HgA1c results as ordered upon admission and as needed Assess patient nutrition upon admission and as needed per policy Treatment Activities: Patient referred to Primary Care Physician for further nutritional evaluation : 05/18/2019 Notes: Wound/Skin Impairment  Nursing Diagnoses: Impaired tissue integrity Knowledge deficit related to ulceration/compromised skin integrity Goals: Patient/caregiver will verbalize understanding of skin care regimen Date Initiated: 05/18/2019 Target Resolution Date: 06/15/2019 Goal Status: Active Ulcer/skin breakdown will have a volume reduction of 30% by week 4 Date Initiated: 05/18/2019 Target Resolution Date: 06/15/2019 Goal Status: Active Interventions: Assess patient/caregiver ability to obtain necessary supplies Assess patient/caregiver ability to perform ulcer/skin care regimen upon admission and as needed Assess ulceration(s) every visit Provide education on ulcer and skin care Treatment Activities: Skin care regimen initiated : 05/18/2019 Topical wound management initiated : 05/18/2019 Notes: Electronic Signature(s) Signed: 06/08/2019 5:54:02 PM By: [name] RN, BSN Entered By: [name] on 06/08/2019 17:21:35 -------------------------------------------------------------------------------- Pain Assessment Details Patient Name: Date of Service: [name] 06/08/2019 1:45 PM Medical Record [id number] Patient Account Number: [id number] Date of Birth/Sex: Treating RN: 08/29/1945 (74 y.o. [name] Primary Care Imari Sivertsen: [name] Other Clinician: Referring Grant Henkes: Treating Ramiel Forti/Extender:Stone III,  Lemar Livings, Lendon Ka Weeks in Treatment: 3 Active Problems Location of Pain Severity and Description of Pain Patient Has Paino No Site Locations Pain Management and Medication Current Pain Management: Electronic Signature(s) Signed: 06/08/2019 5:54:02 PM By: [name] RN, BSN Signed: 07/20/2019 9:21:08 AM By: Sandre Kitty Entered By: Sandre Kitty on 06/08/2019 14:41:18 -------------------------------------------------------------------------------- Patient/Caregiver Education Details Patient Name: Date of Service: [name] 3/3/2021andnbsp1:45 PM Medical Record [id number] Patient Account Number: [id number] Date of Birth/Gender: Treating RN: 08/01/1945 (74 y.o. [name] Primary Care Physician: [name] Other Clinician: Referring Physician: Treating Physician/Extender:Stone III, Lemar Livings, Abelino Derrick in Treatment: 3 Education Assessment Education Provided To: Patient Education Topics Provided Wound/Skin Impairment: Methods: Explain/Verbal Responses: State content correctly Electronic Signature(s) Signed: 06/08/2019 5:54:02 PM By: [name] RN, BSN Entered By: [name] on 06/08/2019 17:21:52 -------------------------------------------------------------------------------- Wound Assessment Details Patient Name: Date of Service: [name] 06/08/2019 1:45 PM Medical Record [id number] Patient Account Number: [id number] Date of Birth/Sex: Treating RN: [birth year] (74 y.o. [name] Primary Care Jesse Hirst: [name] Other Clinician: Referring Marthann Abshier: Treating Aneesah Hernan/Extender:Stone III, Lemar Livings, Lendon Ka Weeks in Treatment: 3 Wound Status Wound Number: 12 Primary Diabetic Wound/Ulcer of the Lower Extremity Etiology: Wound Location: Left Lower Leg - Anterior, Proximal Wound Open Wounding Event: Blister Status: Date Acquired: 06/08/2019 Comorbid Cataracts, Anemia, Asthma,  Congestive Heart Weeks Of Treatment: 0 History: Failure, Coronary Artery Disease, Clustered Wound: No Hypertension, Peripheral Arterial Disease, Peripheral Venous Disease, Type II Diabetes, End Stage Renal Disease, Osteoarthritis, Osteomyelitis, Neuropathy, Received Radiation Photos Wound Measurements Length: (cm) 3.3 % Reductio Width: (cm) 4.4 % Reductio Depth: (cm) 0.1 Epithelial Area: (cm) 11.404 Tunneling Volume: (cm) 1.14 Undermini Wound Description Classification: Grade 2 Wound Margin: Distinct, outline attached Exudate Amount: Medium Exudate Type: Serosanguineous Exudate Color: red, brown Wound Bed Granulation Amount: Large (67-100%) Granulation Quality: Pink, Pale Necrotic Amount: Small (1-33%) Necrotic Quality: Adherent Slough Foul Odor After Cleansing: No Slough/Fibrino Yes Exposed Structure Fascia Exposed: No Fat Layer (Subcutaneous Tissue) Exposed: Yes Tendon Exposed: No Muscle Exposed: No Joint Exposed: No Bone Exposed: No n in Area: 0% n in Volume: 0% ization: Small (1-33%) : No ng: No Electronic Signature(s) Signed: 06/13/2019 4:54:41 PM By: Mikeal Hawthorne EMT/HBOT Signed: 06/13/2019 5:59:01 PM By: [name] RN, BSN Previous Signature: 06/08/2019 5:43:51 PM Version By: Deon Pilling Previous Signature: 06/08/2019 5:54:02 PM Version By: [name] RN, BSN Entered By: Mikeal Hawthorne on 06/13/2019 13:24:18 -------------------------------------------------------------------------------- Wound Assessment Details Patient Name: Date of Service: [name]. 06/08/2019 1:45 PM  Medical Record [id number] Patient Account Number: [id number] Date of Birth/Sex: Treating RN: 08/09/1945 (74 y.o. [name] Primary Care Malena Timpone: [name] Other Clinician: Referring Aberdeen Hafen: Treating Roni Friberg/Extender:Stone III, Lemar Livings, Lendon Ka Weeks in Treatment: 3 Wound Status Wound Number: 13 Primary Diabetic Wound/Ulcer of the Lower  Extremity Etiology: Wound Location: Left Lower Leg - Medial Wound Open Wounding Event: Blister Status: Date Acquired: 06/08/2019 Comorbid Cataracts, Anemia, Asthma, Congestive Heart Weeks Of Treatment: 0 History: Failure, Coronary Artery Disease, Clustered Wound: No Hypertension, Peripheral Arterial Disease, Peripheral Venous Disease, Type II Diabetes, End Stage Renal Disease, Osteoarthritis, Osteomyelitis, Neuropathy, Received Radiation Photos Wound Measurements Length: (cm) 0.5 Width: (cm) 0.8 Depth: (cm) 0.1 Area: (cm) 0.314 Volume: (cm) 0.031 Wound Description Classification: Grade 2 Wound Margin: Distinct, outline attached Exudate Amount: Medium Exudate Type: Serosanguineous Exudate Color: red, brown Wound Bed Granulation Amount: Medium (34-66%) Granulation Quality: Red, Pink Necrotic Amount: Medium (34-66%) Necrotic Quality: Adherent Slough After Cleansing: No brino Yes Exposed Structure osed: No (Subcutaneous Tissue) Exposed: Yes osed: No osed: No sed: No ed: No % Reduction in Area: 0% % Reduction in Volume: 0% Epithelialization: Small (1-33%) Tunneling: No Undermining: No Foul Odor Slough/Fi Fascia Exp Fat Layer Tendon Exp Muscle Exp Joint Expo Bone Expos Electronic Signature(s) Signed: 06/13/2019 4:54:41 PM By: Mikeal Hawthorne EMT/HBOT Signed: 06/13/2019 5:59:01 PM By: [name] RN, BSN Previous Signature: 06/08/2019 5:43:51 PM Version By: Deon Pilling Previous Signature: 06/08/2019 5:54:02 PM Version By: [name] RN, BSN Entered By: Mikeal Hawthorne on 06/13/2019 13:25:15 -------------------------------------------------------------------------------- Wound Assessment Details Patient Name: Date of Service: [name] 06/08/2019 1:45 PM Medical Record [id number] Patient Account Number: [id number] Date of Birth/Sex: Treating RN: June 02, 1945 (74 y.o. [name] Primary Care Emiliano Welshans: [name] Other  Clinician: Referring Zerina Hallinan: Treating Fabrizzio Marcella/Extender:Stone III, Lemar Livings, Lendon Ka Weeks in Treatment: 3 Wound Status Wound Number: 14 Primary Diabetic Wound/Ulcer of the Lower Extremity Etiology: Wound Location: Left Lower Leg - Anterior Wound Open Wounding Event: Blister Status: Date Acquired: 06/08/2019 Comorbid Cataracts, Anemia, Asthma, Congestive Heart Weeks Of Treatment: 0 History: Failure, Coronary Artery Disease, Clustered Wound: No Hypertension, Peripheral Arterial Disease, Peripheral Venous Disease, Type II Diabetes, End Stage Renal Disease, Osteoarthritis, Osteomyelitis, Neuropathy, Received Radiation Photos Wound Measurements Length: (cm) 3 % Reduction i Width: (cm) 3 % Reduction i Depth: (cm) 0.1 Tunneling: Area: (cm) 7.069 Undermining: Volume: (cm) 0.707 Wound Description Classification: Grade 2 Foul Odor Aft Wound Margin: Distinct, outline attached Slough/Fibrin Exudate Amount: Medium Exudate Type: Serosanguineous Exudate Color: red, brown Wound Bed Granulation Amount: Medium (34-66%) Granulation Quality: Red, Pink Fascia Expose Necrotic Amount: Medium (34-66%) Fat Layer (Su Necrotic Quality: Adherent Slough Tendon Expose Muscle Expose Joint Exposed Bone Exposed: Electronic Signature(s) Signed: 06/13/2019 4:54:41 PM By: Mikeal Hawthorne EMT/HBOT Signed: 06/13/2019 5:59:01 PM By: [name] RN, BSN Previous Signature: 06/08/2019 5:43:51 PM Version By: Deon Pilling Previous Signature: 06/08/2019 5:54:02 PM Version By: Barrie Lyme Entered By: Mikeal Hawthorne on 03/08 er Cleansing: No o Yes Exposed Structure d: No bcutaneous Tissue) Exposed: Yes d: No d: No : No No a RN, BSN /2021 13:24:39 n Area: 0% n Volume: 0% No No -------------------------------------------------------------------------------- Wound Assessment Details Patient Name: Date of Service: [name], [name] 06/08/2019 1:45 PM Medical Record [id number] Patient  Account Number: [id number] Date of Birth/Sex: Treating RN: May 04, 1945 (74 y.o. [name] Primary Care Herchel Hopkin: [name] Other Clinician: Referring Tamora Huneke: Treating Dannis Deroche/Extender:Stone III, Lemar Livings, Lendon Ka Weeks in Treatment: 3 Wound Status Wound Number: 15 Primary  Diabetic Wound/Ulcer of the Lower Extremity Etiology: Wound Location: Left Lower Leg - Posterior Wound Open Wounding Event: Blister Status: Date Acquired: 06/08/2019 Comorbid Cataracts, Anemia, Asthma, Congestive Heart Weeks Of Treatment: 0 History: Failure, Coronary Artery Disease, Clustered Wound: No Hypertension, Peripheral Arterial Disease, Peripheral Venous Disease, Type II Diabetes, End Stage Renal Disease, Osteoarthritis, Osteomyelitis, Neuropathy, Received Radiation Photos Wound Measurements Length: (cm) 3 Width: (cm) 3 Depth: (cm) 0.1 Area: (cm) 7.069 Volume: (cm) 0.707 Wound Description Classification: Grade 2 Wound Margin: Distinct, outline attached Exudate Amount: Medium Exudate Type: Serosanguineous Exudate Color: red, brown Wound Bed Granulation Amount: Large (67-100%) Granulation Quality: Red Necrotic Amount: None Present (0%) After Cleansing: No brino No Exposed Structure sed: No Subcutaneous Tissue) Exposed: Yes sed: No sed: No ed: No d: No % Reduction in Area: 0% % Reduction in Volume: 0% Epithelialization: Small (1-33%) Tunneling: No Undermining: No Foul Odor Slough/Fi Fascia Expo Fat Layer ( Tendon Expo Muscle Expo Joint Expos Bone Expose Electronic Signature(s) Signed: 06/13/2019 4:54:41 PM By: Mikeal Hawthorne EMT/HBOT Signed: 06/13/2019 5:59:01 PM By: [name] RN, BSN Previous Signature: 06/08/2019 5:43:51 PM Version By: Deon Pilling Previous Signature: 06/08/2019 5:54:02 PM Version By: [name] RN, BSN Entered By: Mikeal Hawthorne on 06/13/2019  13:27:22 -------------------------------------------------------------------------------- Wound Assessment Details Patient Name: Date of Service: [name] 06/08/2019 1:45 PM Medical Record [id number] Patient Account Number: [id number] Date of Birth/Sex: Treating RN: April 28, 1945 (74 y.o. [name] Primary Care Tenya Araque: [name] Other Clinician: Referring Trenell Moxey: Treating Devanee Pomplun/Extender:Stone III, Lemar Livings, Lendon Ka Weeks in Treatment: 3 Wound Status Wound Number: 8 Primary Venous Leg Ulcer Etiology: Wound Location: Right Lower Leg - Anterior Wound Healed - Epithelialized Wounding Event: Gradually Appeared Status: Date Acquired: 12/07/2018 Comorbid Cataracts, Anemia, Asthma, Congestive Heart Weeks Of Treatment: 3 History: Failure, Coronary Artery Disease, Clustered Wound: No Hypertension, Peripheral Arterial Disease, Peripheral Venous Disease, Type II Diabetes, End Stage Renal Disease, Osteoarthritis, Osteomyelitis, Neuropathy, Received Radiation Photos Wound Measurements Length: (cm) 0 % Reduction Width: (cm) 0 % Reduct Depth: (cm) 0 Epitheli Area: (cm) 0 Volume: (cm) 0 Wound Description Classification: Full Thickness Without Exposed Support Foul Od Structures Slough/ Wound Flat and Intact Margin: Exudate Medium Amount: Exudate Serosanguineous Type: Exudate red, brown Color: Wound Bed Granulation Amount: Large (67-100%) Granulation Quality: Red Fascia E Necrotic Amount: None Present (0%) Fat Laye Tendon E Muscle E Joint Ex Bone Exp or After Cleansing: No Fibrino No Exposed Structure xposed: No r (Subcutaneous Tissue) Exposed: Yes xposed: No xposed: No posed: No osed: No in Area: 100% ion in Volume: 100% alization: Large (67-100%) Electronic Signature(s) Signed: 06/13/2019 4:54:41 PM By: Mikeal Hawthorne EMT/HBOT Signed: 06/13/2019 5:59:01 PM By: [name] RN, BSN Previous Signature: 06/08/2019 5:54:02 PM  Version By: [name] RN, BSN Entered By: Mikeal Hawthorne on 06/13/2019 13:23:12 -------------------------------------------------------------------------------- Wound Assessment Details Patient Name: Date of Service: [name] 06/08/2019 1:45 PM Medical Record [id number] Patient Account Number: [id number] Date of Birth/Sex: Treating RN: 07/10/1945 (74 y.o. [name] Primary Care Quetzaly Ebner: [name] Other Clinician: Referring Bryanne Riquelme: Treating Cliff Damiani/Extender:Stone III, Lemar Livings, Lendon Ka Weeks in Treatment: 3 Wound Status Wound Number: 9 Primary Venous Leg Ulcer Etiology: Wound Location: Right Lower Leg - Medial, Posterior Wound Healed - Epithelialized Status: Wounding Event: Gradually Appeared Comorbid Cataracts, Anemia, Asthma, Congestive Heart Date Acquired: 12/07/2018 History: Failure, Coronary Artery Disease, Weeks Of Treatment: 3 Hypertension, Peripheral Arterial Disease, Clustered Wound: No Peripheral Venous Disease, Type II Diabetes, End Stage Renal Disease, Osteoarthritis, Osteomyelitis, Neuropathy, Received  Radiation Photos Wound Measurements Length: (cm) 0 % Reduct Width: (cm) 0 % Reduct Depth: (cm) 0 Epitheli Area: (cm) 0 Volume: (cm) 0 Wound Description Classification: Full Thickness Without Exposed Support Foul Od Structures Slough/ Wound Flat and Intact Margin: Exudate Small Amount: Exudate Serosanguineous Type: Exudate red, brown Color: Wound Bed Granulation Amount: None Present (0%) Necrotic Amount: None Present (0%) Fascia E Fat Laye Tendon E Muscle E Joint Ex Bone Exp or After Cleansing: No Fibrino Yes Exposed Structure xposed: No r (Subcutaneous Tissue) Exposed: Yes xposed: No xposed: No posed: No osed: No ion in Area: 100% ion in Volume: 100% alization: Large (67-100%) Electronic Signature(s) Signed: 06/13/2019 4:54:41 PM By: Mikeal Hawthorne EMT/HBOT Signed: 06/13/2019 5:59:01 PM By:  [name] RN, BSN Previous Signature: 06/08/2019 5:54:02 PM Version By: [name] RN, BSN Entered By: Mikeal Hawthorne on 06/13/2019 13:23:53 -------------------------------------------------------------------------------- Vitals Details Patient Name: Date of Service: [name] 06/08/2019 1:45 PM Medical Record [id number] Patient Account Number: [id number] Date of Birth/Sex: Treating RN: 02-14-1946 (74 y.o. [name] Primary Care Zackeriah Kissler: [name] Other Clinician: Referring Trayven Lumadue: Treating Siriyah Ambrosius/Extender:Stone III, Lemar Livings, Lendon Ka Weeks in Treatment: 3 Vital Signs Time Taken: 14:20 Temperature (F): 99.6 Height (in): 64 Respiratory Rate (breaths/min): 18 Weight (lbs): 160 Reference Range: 80 - 120 mg / dl Body Mass Index (BMI): 27.5 Electronic Signature(s) Signed: 07/20/2019 9:21:08 AM By: Sandre Kitty Entered By: Sandre Kitty on 06/08/2019 14:41:13

## 2019-07-20 NOTE — Progress Notes (Signed)
[name] ([id number]) Visit Report for 06/29/2019 Arrival Information Details Patient Name: Date of Service: [name], [name] 06/29/2019 2:00 PM Medical Record [id number] Patient Account Number: [id number] Date of Birth/Sex: Treating RN: 23-Feb-1946 (74 y.o. [name], Linda Primary Care Lenus Trauger: [name] Other Clinician: Referring Lashai Grosch: Treating Zacharie Portner/Extender:Stone III, Lemar Livings, Abelino Derrick in Treatment: 6 Visit Information History Since Last Visit Added or deleted any medications: No Patient Arrived: Ambulatory Any new allergies or adverse reactions: No Arrival Time: 14:47 Had a fall or experienced change in No Accompanied By: self activities of daily living that may affect Transfer Assistance: None risk of falls: Patient Identification Verified: Yes Signs or symptoms of abuse/neglect since last No Secondary Verification Process Yes visito Completed: Hospitalized since last visit: No Patient Requires Transmission- No Implantable device outside of the clinic excluding No Based Precautions: cellular tissue based products placed in the center Patient Has Alerts: Yes since last visit: Patient Alerts: R ABI non Has Dressing in Place as Prescribed: Yes compressible Pain Present Now: No L ABI non compressible Electronic Signature(s) Signed: 07/20/2019 9:21:08 AM By: Sandre Kitty Entered By: Sandre Kitty on 06/29/2019 14:47:40 -------------------------------------------------------------------------------- Clinic Level of Care Assessment Details Patient Name: Date of Service: [name] 06/29/2019 2:00 PM Medical Record [id number] Patient Account Number: [id number] Date of Birth/Sex: Treating RN: 1945/05/12 (74 y.o. [name] Primary Care Madelin Weseman: [name] Other Clinician: Referring Alexandro Line: Treating Jaclin Finks/Extender:Stone III, Lemar Livings, Abelino Derrick in Treatment: 6 Clinic Level  of Care Assessment Items TOOL 4 Quantity Score []  - Use when only an EandM is performed on FOLLOW-UP visit 0 ASSESSMENTS - Nursing Assessment / Reassessment X - Reassessment of Co-morbidities (includes updates in patient status) 1 10 X - Reassessment of Adherence to Treatment Plan 1 5 ASSESSMENTS - Wound and Skin Assessment / Reassessment X - Simple Wound Assessment / Reassessment - one wound 1 5 []  - Complex Wound Assessment / Reassessment - multiple wounds 0 []  - Dermatologic / Skin Assessment (not related to wound area) 0 ASSESSMENTS - Focused Assessment []  - Circumferential Edema Measurements - multi extremities 0 []  - Nutritional Assessment / Counseling / Intervention 0 []  - Lower Extremity Assessment (monofilament, tuning fork, pulses) 0 []  - Peripheral Arterial Disease Assessment (using hand held doppler) 0 ASSESSMENTS - Ostomy and/or Continence Assessment and Care []  - Incontinence Assessment and Management 0 []  - Ostomy Care Assessment and Management (repouching, etc.) 0 PROCESS - Coordination of Care X - Simple Patient / Family Education for ongoing care 1 15 []  - Complex (extensive) Patient / Family Education for ongoing care 0 X - Staff obtains Programmer, systems, Records, Test Results / Process Orders 1 10 []  - Staff telephones HHA, Nursing Homes / Clarify orders / etc 0 []  - Routine Transfer to another Facility (non-emergent condition) 0 []  - Routine Hospital Admission (non-emergent condition) 0 []  - New Admissions / Biomedical engineer / Ordering NPWT, Apligraf, etc. 0 []  - Emergency Hospital Admission (emergent condition) 0 X - Simple Discharge Coordination 1 10 []  - Complex (extensive) Discharge Coordination 0 PROCESS - Special Needs []  - Pediatric / Minor Patient Management 0 []  - Isolation Patient Management 0 []  - Hearing / Language / Visual special needs 0 []  - Assessment of Community assistance (transportation, D/C planning, etc.) 0 []  - Additional assistance /  Altered mentation 0 []  - Support Surface(s) Assessment (bed, cushion, seat, etc.) 0 INTERVENTIONS - Wound Cleansing / Measurement X - Simple Wound Cleansing - one wound 1  5 []  - Complex Wound Cleansing - multiple wounds 0 X - Wound Imaging (photographs - any number of wounds) 1 5 []  - Wound Tracing (instead of photographs) 0 X - Simple Wound Measurement - one wound 1 5 []  - Complex Wound Measurement - multiple wounds 0 INTERVENTIONS - Wound Dressings []  - Small Wound Dressing one or multiple wounds 0 []  - Medium Wound Dressing one or multiple wounds 0 []  - Large Wound Dressing one or multiple wounds 0 []  - Application of Medications - topical 0 []  - Application of Medications - injection 0 INTERVENTIONS - Miscellaneous []  - External ear exam 0 []  - Specimen Collection (cultures, biopsies, blood, body fluids, etc.) 0 []  - Specimen(s) / Culture(s) sent or taken to Lab for analysis 0 []  - Patient Transfer (multiple staff / Civil Service fast streamer / Similar devices) 0 []  - Simple Staple / Suture removal (25 or less) 0 []  - Complex Staple / Suture removal (26 or more) 0 []  - Hypo / Hyperglycemic Management (close monitor of Blood Glucose) 0 []  - Ankle / Brachial Index (ABI) - do not check if billed separately 0 X - Vital Signs 1 5 Has the patient been seen at the hospital within the last three years: Yes Total Score: 75 Level Of Care: New/Established - Level 2 Electronic Signature(s) Signed: 06/29/2019 5:36:50 PM By: Baruch Gouty RN, BSN Entered By: Baruch Gouty on 06/29/2019 15:29:53 -------------------------------------------------------------------------------- Encounter Discharge Information Details Patient Name: Date of Service: [name] 06/29/2019 2:00 PM Medical Record [id number] Patient Account Number: [id number] Date of Birth/Sex: Treating RN: [birth year] (74 y.o. [name] Primary Care Alika Saladin: [name] Other Clinician: Referring Kharma Sampsel:  Treating Marialy Urbanczyk/Extender:Stone III, Lemar Livings, Abelino Derrick in Treatment: 6 Encounter Discharge Information Items Discharge Condition: Stable Ambulatory Status: Ambulatory Discharge Destination: Home Transportation: Private Auto Accompanied By: self Schedule Follow-up Appointment: Yes Clinical Summary of Care: Patient Declined Electronic Signature(s) Signed: 06/29/2019 5:15:50 PM By: Carlene Coria RN Entered By: Carlene Coria on 06/29/2019 15:40:32 -------------------------------------------------------------------------------- [place] Details Patient Name: Date of Service: [name] 06/29/2019 2:00 PM Medical Record [id number] Patient Account Number: [id number] Date of Birth/Sex: Treating RN: [birth year] (74 y.o. [name] Primary Care Drey Shaff: [name] Other Clinician: Referring Willam Munford: Treating Dalal Livengood/Extender:Stone III, Lemar Livings, Lendon Ka Weeks in Treatment: 6 Active Inactive Electronic Signature(s) Signed: 06/29/2019 5:36:50 PM By: Baruch Gouty RN, BSN Entered By: Baruch Gouty on 06/29/2019 15:28:52 -------------------------------------------------------------------------------- Pain Assessment Details Patient Name: Date of Service: [name] 06/29/2019 2:00 PM Medical Record [id number] Patient Account Number: [id number] Date of Birth/Sex: Treating RN: 12-30-1945 (74 y.o. [name] Primary Care Darleth Eustache: [name] Other Clinician: Referring Cecylia Brazill: Treating Miyuki Rzasa/Extender:Stone III, Lemar Livings, Lendon Ka Weeks in Treatment: 6 Active Problems Location of Pain Severity and Description of Pain Patient Has Paino No Site Locations Pain Management and Medication Current Pain Management: Electronic Signature(s) Signed: 06/29/2019 5:36:50 PM By: Baruch Gouty RN, BSN Signed: 07/20/2019 9:21:08 AM By: Sandre Kitty Entered By: Sandre Kitty on 06/29/2019  14:49:47 -------------------------------------------------------------------------------- Patient/Caregiver Education Details Patient Name: Date of Service: Binning, Haillie J. 3/24/2021andnbsp2:00 PM Medical Record [id number] Patient Account Number: [id number] Date of Birth/Gender: Treating RN: 07-26-1945 (74 y.o. [name] Primary Care Physician: [name] Other Clinician: Referring Physician: Treating Physician/Extender:Stone III, Lemar Livings, Abelino Derrick in Treatment: 6 Education Assessment Education Provided To: Patient Education Topics Provided Venous: Methods: Explain/Verbal Responses: Reinforcements needed, State content correctly Wound/Skin Impairment: Methods: Explain/Verbal Responses: Reinforcements needed,  State content correctly Electronic Signature(s) Signed: 06/29/2019 5:36:50 PM By: Baruch Gouty RN, BSN Entered By: Baruch Gouty on 06/29/2019 15:29:25 -------------------------------------------------------------------------------- Wound Assessment Details Patient Name: Date of Service: [name] 06/29/2019 2:00 PM Medical Record [id number] Patient Account Number: [id number] Date of Birth/Sex: Treating RN: 30-Nov-1945 (74 y.o. [name], Linda Primary Care Demarco Bacci: [name] Other Clinician: Referring Iyah Laguna: Treating Keiasia Christianson/Extender:Stone III, Lemar Livings, Lendon Ka Weeks in Treatment: 6 Wound Status Wound Number: 15 Primary Diabetic Wound/Ulcer of the Lower Extremity Etiology: Wound Location: Left, Posterior Lower Leg Wound Healed - Epithelialized Wounding Event: Blister Status: Date Acquired: 06/08/2019 Comorbid Cataracts, Anemia, Asthma, Congestive Heart Weeks Of Treatment: 3 History: Failure, Coronary Artery Disease, Clustered Wound: No Hypertension, Peripheral Arterial Disease, Peripheral Venous Disease, Type II Diabetes, End Stage Renal Disease, Osteoarthritis, Osteomyelitis,  Neuropathy, Received Radiation Photos Photo Uploaded By: Mikeal Hawthorne on 06/30/2019 13:15:55 Wound Measurements Length: (cm) 0 % Reduction Width: (cm) 0 % Reduction Depth: (cm) 0 Epitheliali Area: (cm) 0 Tunneling: Volume: (cm) 0 Underminin Wound Description Classification: Grade 2 Wound Margin: Distinct, outline attached Exudate Amount: None Present Wound Bed Granulation Amount: None Present (0%) Necrotic Amount: None Present (0%) Foul Odor After Cleansing: No Slough/Fibrino No Exposed Structure Fascia Exposed: No Fat Layer (Subcutaneous Tissue) Exposed: No Tendon Exposed: No Muscle Exposed: No Joint Exposed: No Bone Exposed: No in Area: 100% in Volume: 100% zation: Large (67-100%) No g: No Electronic Signature(s) Signed: 06/29/2019 5:36:50 PM By: Baruch Gouty RN, BSN Entered By: Baruch Gouty on 06/29/2019 15:31:44 -------------------------------------------------------------------------------- [place] Details Patient Name: Date of Service: [name] 06/29/2019 2:00 PM Medical Record [id number] Patient Account Number: [id number] Date of Birth/Sex: Treating RN: [birth year] (74 y.o. [name] Primary Care Florenda Watt: [name] Other Clinician: Referring Damir Leung: Treating Adana Marik/Extender:Stone III, Lemar Livings, Lendon Ka Weeks in Treatment: 6 Vital Signs Time Taken: 14:50 Temperature (F): 97.8 Height (in): 64 Pulse (bpm): 87 Weight (lbs): 160 Respiratory Rate (breaths/min): 18 Body Mass Index (BMI): 27.5 Blood Pressure (mmHg): 141/61 Capillary Blood Glucose (mg/dl): 107 Reference Range: 80 - 120 mg / dl Electronic Signature(s) Signed: 07/20/2019 9:21:08 AM By: Sandre Kitty Entered By: Sandre Kitty on 06/29/2019 14:49:43

## 2019-07-21 DIAGNOSIS — D631 Anemia in chronic kidney disease: Secondary | ICD-10-CM | POA: Diagnosis not present

## 2019-07-21 DIAGNOSIS — Z992 Dependence on renal dialysis: Secondary | ICD-10-CM | POA: Diagnosis not present

## 2019-07-21 DIAGNOSIS — E119 Type 2 diabetes mellitus without complications: Secondary | ICD-10-CM | POA: Diagnosis not present

## 2019-07-21 DIAGNOSIS — E876 Hypokalemia: Secondary | ICD-10-CM | POA: Diagnosis not present

## 2019-07-21 DIAGNOSIS — N2581 Secondary hyperparathyroidism of renal origin: Secondary | ICD-10-CM | POA: Diagnosis not present

## 2019-07-21 DIAGNOSIS — N186 End stage renal disease: Secondary | ICD-10-CM | POA: Diagnosis not present

## 2019-07-21 DIAGNOSIS — D509 Iron deficiency anemia, unspecified: Secondary | ICD-10-CM | POA: Diagnosis not present

## 2019-07-22 NOTE — Progress Notes (Signed)
Subjective: 74 year old female presents the office today for nail trim as well as a callus to her right foot.  Since I last saw her she states that she had a callus on her left foot and she was wrapping her legs.  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 Sharol Given.  She currently denies any ulcerations but she wants to get back on her regular schedule for foot exams, nail trim. Denies any systemic complaints such as fevers, chills, nausea, vomiting. No acute changes since last appointment, and no other complaints at this time.   Objective: AAO x3, NAD Pulses decreased.  Sensation decreased with [place] monofilament. Nails are hypertrophic, dystrophic, discolored.  The left side 1-3, right 1,3,4.  No edema, erythema or signs of infection.  Hyperkeratotic lesion right foot plantar midfoot without any underlying ulceration or signs of infection. No open lesions or pre-ulcerative lesions.  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  -Nails debrided x6 without complications or bleeding -Hyperkeratotic lesion debrided x1 without any complications or bleeding. -Discussed daily foot inspection.  Offloading at all times. -Patient encouraged to call the office with any questions, concerns, change in symptoms.   [name] DPM

## 2019-07-23 DIAGNOSIS — N186 End stage renal disease: Secondary | ICD-10-CM | POA: Diagnosis not present

## 2019-07-23 DIAGNOSIS — N2581 Secondary hyperparathyroidism of renal origin: Secondary | ICD-10-CM | POA: Diagnosis not present

## 2019-07-23 DIAGNOSIS — E876 Hypokalemia: Secondary | ICD-10-CM | POA: Diagnosis not present

## 2019-07-23 DIAGNOSIS — D509 Iron deficiency anemia, unspecified: Secondary | ICD-10-CM | POA: Diagnosis not present

## 2019-07-23 DIAGNOSIS — Z992 Dependence on renal dialysis: Secondary | ICD-10-CM | POA: Diagnosis not present

## 2019-07-23 DIAGNOSIS — D631 Anemia in chronic kidney disease: Secondary | ICD-10-CM | POA: Diagnosis not present

## 2019-07-26 DIAGNOSIS — N186 End stage renal disease: Secondary | ICD-10-CM | POA: Diagnosis not present

## 2019-07-26 DIAGNOSIS — D509 Iron deficiency anemia, unspecified: Secondary | ICD-10-CM | POA: Diagnosis not present

## 2019-07-26 DIAGNOSIS — Z992 Dependence on renal dialysis: Secondary | ICD-10-CM | POA: Diagnosis not present

## 2019-07-26 DIAGNOSIS — N2581 Secondary hyperparathyroidism of renal origin: Secondary | ICD-10-CM | POA: Diagnosis not present

## 2019-07-26 DIAGNOSIS — D631 Anemia in chronic kidney disease: Secondary | ICD-10-CM | POA: Diagnosis not present

## 2019-07-26 DIAGNOSIS — E876 Hypokalemia: Secondary | ICD-10-CM | POA: Diagnosis not present

## 2019-07-28 DIAGNOSIS — D631 Anemia in chronic kidney disease: Secondary | ICD-10-CM | POA: Diagnosis not present

## 2019-07-28 DIAGNOSIS — D509 Iron deficiency anemia, unspecified: Secondary | ICD-10-CM | POA: Diagnosis not present

## 2019-07-28 DIAGNOSIS — N2581 Secondary hyperparathyroidism of renal origin: Secondary | ICD-10-CM | POA: Diagnosis not present

## 2019-07-28 DIAGNOSIS — N186 End stage renal disease: Secondary | ICD-10-CM | POA: Diagnosis not present

## 2019-07-28 DIAGNOSIS — E876 Hypokalemia: Secondary | ICD-10-CM | POA: Diagnosis not present

## 2019-07-28 DIAGNOSIS — Z992 Dependence on renal dialysis: Secondary | ICD-10-CM | POA: Diagnosis not present

## 2019-07-29 ENCOUNTER — Telehealth (HOSPITAL_COMMUNITY): Payer: Self-pay

## 2019-07-29 DIAGNOSIS — I871 Compression of vein: Secondary | ICD-10-CM | POA: Diagnosis not present

## 2019-07-29 DIAGNOSIS — N186 End stage renal disease: Secondary | ICD-10-CM | POA: Diagnosis not present

## 2019-07-29 DIAGNOSIS — Z992 Dependence on renal dialysis: Secondary | ICD-10-CM | POA: Diagnosis not present

## 2019-07-29 DIAGNOSIS — T82858A Stenosis of vascular prosthetic devices, implants and grafts, initial encounter: Secondary | ICD-10-CM | POA: Diagnosis not present

## 2019-07-29 NOTE — Telephone Encounter (Signed)

## 2019-07-30 DIAGNOSIS — D631 Anemia in chronic kidney disease: Secondary | ICD-10-CM | POA: Diagnosis not present

## 2019-07-30 DIAGNOSIS — N2581 Secondary hyperparathyroidism of renal origin: Secondary | ICD-10-CM | POA: Diagnosis not present

## 2019-07-30 DIAGNOSIS — D509 Iron deficiency anemia, unspecified: Secondary | ICD-10-CM | POA: Diagnosis not present

## 2019-07-30 DIAGNOSIS — Z992 Dependence on renal dialysis: Secondary | ICD-10-CM | POA: Diagnosis not present

## 2019-07-30 DIAGNOSIS — N186 End stage renal disease: Secondary | ICD-10-CM | POA: Diagnosis not present

## 2019-07-30 DIAGNOSIS — E876 Hypokalemia: Secondary | ICD-10-CM | POA: Diagnosis not present

## 2019-08-01 ENCOUNTER — Ambulatory Visit (INDEPENDENT_AMBULATORY_CARE_PROVIDER_SITE_OTHER)
Admission: RE | Admit: 2019-08-01 | Discharge: 2019-08-01 | Disposition: A | Discharge status: Home / Self Care | Payer: Medicare Other | Source: Ambulatory Visit | Attending: Surgery | Admitting: Surgery

## 2019-08-01 ENCOUNTER — Ambulatory Visit (INDEPENDENT_AMBULATORY_CARE_PROVIDER_SITE_OTHER): Payer: Medicare Other | Admitting: Physician Assistant

## 2019-08-01 ENCOUNTER — Ambulatory Visit (HOSPITAL_COMMUNITY)
Admission: RE | Admit: 2019-08-01 | Discharge: 2019-08-01 | Disposition: A | Discharge status: Home / Self Care | Payer: Medicare Other | Source: Ambulatory Visit | Attending: Surgery | Admitting: Surgery

## 2019-08-01 ENCOUNTER — Other Ambulatory Visit: Payer: Self-pay

## 2019-08-01 VITALS — BP 134/57 | HR 86 | Temp 95.7°F | Resp 16 | Ht 64.75 in | Wt 156.5 lb

## 2019-08-01 DIAGNOSIS — I739 Peripheral vascular disease, unspecified: Secondary | ICD-10-CM | POA: Diagnosis not present

## 2019-08-01 DIAGNOSIS — Z95828 Presence of other vascular implants and grafts: Secondary | ICD-10-CM | POA: Insufficient documentation

## 2019-08-01 DIAGNOSIS — I25810 Atherosclerosis of coronary artery bypass graft(s) without angina pectoris: Secondary | ICD-10-CM

## 2019-08-01 NOTE — Progress Notes (Signed)
Office Note     CC:  follow up Requesting Provider:  Unk Pinto, MD  HPI: [name] is a 74 y.o. (1946-03-07) female who presents for follow-up of left femoral to popliteal artery bypass with nonreversed translocated great saphenous vein and left femoral artery endarterectomy with Dacron patch angioplasty on March 28, 2019.  This was performed secondary to critical limb ischemia of the left leg with nonhealing foot wounds by Dr. Donnetta Hutching.  She underwent ray amputation of the fifth toe by Dr. Sharol Given on April 06, 2019.  The patient is currently not having lower extremity pain walking or  pain.  She dialyzes via right radiocephalic AV fistula on Tuesdays, Thursdays and Saturdays.  She recently underwent angioplasty of her fistula.  She ambulates from her car to the dialysis clinic with a walking stick.  She ambulates around her home without aid.  She wears bilateral knee-high compression stockings.  The pt is not on a statin for cholesterol management.  The pt is on a daily aspirin.   Other AC:  none The pt is on nitrates for hypertension.   The pt is diabetic.  insulin Tobacco hx:  negative  Past Medical History:  Diagnosis Date  . Allergy   . Arthritis    "maybe in my lower back" (08/27/2012)  . 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 Chronic combined systolic and diastolic CHF (congestive heart failure) ([place])   . CKD (chronic kidney disease), stage IV ([place])   . Coronary artery disease    a. NSTEMI in 05/2009 s/p CABG (LIMA-LAD, SVG-diagonal, SVG-OM1/OM 2).   Marland Kitchen Dyspnea    with exertion  . Family history of anesthesia complication    Mom has a hard time to wake up  . Foot ulcer ([place])    "I've had them on both feet" (08/27/2012)  . Gouty arthritis    "right index finger" (08/27/2012)  . History of radiation therapy 06/09/13-07/06/13   left breast 50Gy  . Hyperlipidemia   . Hypertension   . Infection    right second toe  .  Ischemic cardiomyopathy    a. 2011: EF 40-45%. b. EF 55-60% in 04/2017 but shortly after as inpatient was 45-50%.  Marland Kitchen LBBB (left bundle branch block)   . Mitral regurgitation   . Neuropathy    Hx; of B/L feet  . NSTEMI (non-ST elevated myocardial infarction) ([place]) 05/23/2009  . Osteomyelitis of foot ([place])   . PAD (peripheral artery disease) ([facility])    a. LE PAD (patient previously elected hold off L fem-pop), prev followed by Dr. Bridgett Larsson  . PONV (postoperative nausea and vomiting)   . Sinus headache    "occasionally" (08/27/2012)  . Type II diabetes mellitus ([place])   . Vitamin D deficiency     Past Surgical History:  Procedure Laterality Date  . ABDOMINAL AORTOGRAM W/LOWER EXTREMITY Bilateral 03/25/2019   Procedure: ABDOMINAL AORTOGRAM W/LOWER EXTREMITY;  Surgeon: Marty Heck, MD;  Location: [facility];  Service: Cardiovascular;  Laterality: Bilateral;  . AMPUTATION Right 08/27/2012   Procedure: AMPUTATION RAY;  Surgeon: Newt Minion, MD;  Location: [facility];  Service: Orthopedics;  Laterality: Right;  Right Foot 5th Ray Amputation  . AMPUTATION Left 07/08/2013   Procedure: AMPUTATION RAY;  Surgeon: Newt Minion, MD;  Location: [facility];  Service: Orthopedics;  Laterality: Left;  Left Foot 2nd Ray Amputation  . AMPUTATION Left 04/06/2019   Procedure: LEFT 5TH RAY AMPUTATION;  Surgeon: Newt Minion, MD;  Location: [facility];  Service: Orthopedics;  Laterality: Left;  . AMPUTATION RAY Right 08/27/2012   5th ray/notes 08/27/2012  . AMPUTATION TOE Right 03/06/2017   Procedure: AMPUTATION TOE, INTERPHANGEAL 2ND RIGHT;  Surgeon: [name], DPM;  Location: [facility];  Service: Podiatry;  Laterality: Right;  . AV FISTULA PLACEMENT Right 11/11/2017   Procedure: RIGHT RADIOCEPHALIC  ARTERIOVENOUS FISTULA;  Surgeon: [name], MD;  Location: [facility];  Service: Vascular;  Laterality: Right;  . BREAST LUMPECTOMY  04/20/2013   with biopsy      DR WAKEFIELD  . BREAST LUMPECTOMY WITH NEEDLE  LOCALIZATION AND AXILLARY SENTINEL LYMPH NODE BX Left 04/20/2013   Procedure: LEFT BREAST WIRE GUIDED LUMPECTOMY AND AXILLARY SENTINEL LYMPH NODE BX;  Surgeon: Rolm Bookbinder, MD;  Location: [facility];  Service: General;  Laterality: Left;  . CARDIAC CATHETERIZATION  05/24/2009   Archie Endo 05/24/2009 (08/27/2012)  . CATARACT EXTRACTION W/ INTRAOCULAR LENS  IMPLANT, BILATERAL  2000's  . COLONOSCOPY W/ BIOPSIES AND POLYPECTOMY  2010  . CORONARY ARTERY BYPASS GRAFT  2011   "CABG X4" (08/27/2012)  . DENTAL SURGERY  04/30/11   "1 implant" (08/27/2012)  . DILATION AND CURETTAGE OF UTERUS    . ENDARTERECTOMY FEMORAL Left 03/28/2019   Procedure: ENDARTERECTOMY FEMORAL;  Surgeon: [name], MD;  Location: [facility];  Service: Vascular;  Laterality: Left;  . EYE SURGERY    . FEMORAL-POPLITEAL BYPASS GRAFT Left 03/28/2019   Procedure: BYPASS GRAFT FEMORAL-POPLITEAL ARTERY;  Surgeon: [name], MD;  Location: [facility];  Service: Vascular;  Laterality: Left;  . FINGER SURGERY Right    "index finger; turned out to be gout" (08/27/2012)  . IR FLUORO GUIDE CV LINE RIGHT  11/30/2017  . PATCH ANGIOPLASTY Left 03/28/2019   Procedure: Patch Angioplasty;  Surgeon: [name], MD;  Location: [facility];  Service: Vascular;  Laterality: Left;  . SHOULDER CLOSED REDUCTION Left 05/27/2019   Procedure: CLOSED REDUCTION LEFT SHOULDER;  Surgeon: Mcarthur Rossetti, MD;  Location: [facility];  Service: Orthopedics;  Laterality: Left;  . TEE WITHOUT CARDIOVERSION N/A 12/01/2017   Procedure: TRANSESOPHAGEAL ECHOCARDIOGRAM (TEE);  Surgeon: Lelon Perla, MD;  Location: [facility];  Service: Cardiovascular;  Laterality: N/A;    Social History   Socioeconomic History  . Marital status: Married    Spouse name: [name]  . Number of children: 1  . Years of education: Not on file  . Highest education level: Not on file  Occupational History  . Not on file  Tobacco Use  . Smoking status: Never Smoker  . Smokeless tobacco: Never  Used  Substance and Sexual Activity  . Alcohol use: Not Currently    Comment: rarely drinks wine  . Drug use: No  . Sexual activity: Not Currently  Other Topics Concern  . Not on file  Social History Narrative   ** Merged History Encounter **       Social Determinants of Health   Financial Resource Strain:   . Difficulty of Paying Living Expenses:   Food Insecurity:   . Worried About Charity fundraiser in the Last Year:   . Arboriculturist in the Last Year:   Transportation Needs:   . Film/video editor (Medical):   Marland Kitchen Lack of Transportation (Non-Medical):   Physical Activity:   . Days of Exercise per Week:   . Minutes of Exercise per Session:   Stress:   . Feeling  of Stress :   Social Connections:   . Frequency of Communication with Friends and Family:   . Frequency of Social Gatherings with Friends and Family:   . Attends Religious Services:   . Active Member of Clubs or Organizations:   . Attends Archivist Meetings:   Marland Kitchen Marital Status:   Intimate Partner Violence:   . Fear of Current or Ex-Partner:   . Emotionally Abused:   Marland Kitchen Physically Abused:   . Sexually Abused:     Family History  Problem Relation Age of Onset  . Kidney cancer Brother 44  . Hypertension Brother   . Breast cancer Sister 63       LCIS; BRCA negative  . Throat cancer Father 17       smoker  . Breast cancer Sister 88       Lobular breast cancer  . Breast cancer Maternal Aunt        dx in her 19s    Current Outpatient Medications  Medication Sig Dispense Refill  . allopurinol (ZYLOPRIM) 100 MG tablet Take 1 tablet 2 x /day to Prevent Gout (Patient taking differently: Take 100 mg by mouth 2 (two) times daily. ) 180 tablet 3  . ARTIFICIAL TEAR SOLUTION OP Place 1 drop into both eyes in the morning and at bedtime.    Marland Kitchen aspirin EC 81 MG tablet Take 1 tablet (81 mg total) by mouth daily. (Patient taking differently: Take 81 mg by mouth daily. Skip dose if you take an excedrin  migraine)    . aspirin-acetaminophen-caffeine (EXCEDRIN EXTRA STRENGTH) 250-250-65 MG tablet Take 1 tablet by mouth 2 (two) times daily as needed (pain).    Marland Kitchen atorvastatin (LIPITOR) 10 MG tablet Take 1 tablet (10 mg total) by mouth daily at 6 PM. 90 tablet 1  . b complex-vitamin c-folic acid (NEPHRO-VITE) 0.8 MG TABS tablet Take 1 tablet by mouth daily.     . Bisacodyl (LAXATIVE PO) Take 1 capsule by mouth 3 (three) times daily.    . cinacalcet (SENSIPAR) 30 MG tablet Take 1 tablet (30 mg total) by mouth daily with supper. 60 tablet 1  . cyclobenzaprine (FLEXERIL) 5 MG tablet Take 1 tablet (5 mg total) by mouth 3 (three) times daily as needed for muscle spasms. 60 tablet 1  . Darbepoetin Alfa (ARANESP) 100 MCG/0.5ML SOSY injection Inject 0.5 mLs (100 mcg total) into the vein every Tuesday with hemodialysis. 4.2 mL   . dextrose 50 % solution Dextrose 50%    . docusate sodium (COLACE) 100 MG capsule Take 200 mg by mouth 2 (two) times daily.     Marland Kitchen doxercalciferol (HECTOROL) 4 MCG/2ML injection Inject 2 mLs (4 mcg total) into the vein Every Tuesday,Thursday,and Saturday with dialysis. 2 mL   . ferric gluconate 125 mg in sodium chloride 0.9 % 100 mL Inject 125 mg into the vein Every Tuesday,Thursday,and Saturday with dialysis.    Marland Kitchen gabapentin (NEURONTIN) 100 MG capsule Take 1 capsule (100 mg total) by mouth 2 (two) times daily. (Patient taking differently: Take 100 mg by mouth as needed. ) 60 capsule 1  . Homeopathic Products (THERAWORX RELIEF EX) Apply 1 application topically daily as needed (pain).    . hydrALAZINE (APRESOLINE) 25 MG tablet Take 3 tablets (75 mg total) by mouth every 8 (eight) hours. 270 tablet 1  . HYDROcodone-acetaminophen (NORCO) 5-325 MG tablet Take 1 tablet by mouth every 6 (six) hours as needed for moderate pain. 30 tablet 0  . insulin NPH-regular  Human (70-30) 100 UNIT/ML injection Inject 10-30 Units into the skin 2 (two) times daily with a meal.     . isosorbide dinitrate  (ISORDIL) 10 MG tablet Take 1 tablet 3 x /day for BP & Heart (Patient taking differently: Take 10 mg by mouth 3 (three) times daily. ) 270 tablet 3  . Methoxy PEG-Epoetin Beta (MIRCERA IJ) Mircera    . Multiple Vitamins-Minerals (PRESERVISION AREDS 2 PO) Take 2 capsules by mouth daily at 12 noon.     . predniSONE (DELTASONE) 20 MG tablet     . Probiotic Product ([place]) CAPS Take 1 capsule by mouth in the morning and at bedtime.    . sevelamer carbonate (RENVELA) 800 MG tablet Take 2 tablets (1,600 mg total) by mouth 3 (three) times daily with meals. (Patient taking differently: Take 1,600 mg by mouth See admin instructions. Take 1600 mg with each meal and each snack) 90 tablet 1  . traZODone (DESYREL) 50 MG tablet Take 50 mg by mouth at bedtime as needed for sleep.     No current facility-administered medications for this visit.    Allergies  Allergen Reactions  . Atenolol Rash and Other (See Comments)    Exacerbates gout   . Adhesive [Tape] Other (See Comments)    SKIN IS VERY SENSITIVE AND BRUISES AND TEARS EASILY; PLEASE USE AN ALTERNATIVE TO TAPE!!  . Contrast Media [Iodinated Diagnostic Agents] Rash  . Iohexol Rash  . Latex Rash     REVIEW OF SYSTEMS:   _0  denotes positive finding, _1  denotes negative finding Cardiac  Comments:  Chest pain or chest pressure:    Shortness of breath upon exertion:    Short of breath when lying flat:    Irregular heart rhythm:        Vascular    Pain in calf, thigh, or hip brought on by ambulation:    Pain in feet at night that wakes you up from your sleep:     Blood clot in your veins:    Leg swelling:  x  mild edema bilaterally, left greater than right      Pulmonary    Oxygen at home:    Productive cough:     Wheezing:         Neurologic    Sudden weakness in arms or legs:     Sudden numbness in arms or legs:     Sudden onset of difficulty speaking or slurred speech:    Temporary loss of vision in one eye:       Problems with dizziness:         Gastrointestinal    Blood in stool:     Vomited blood:         Genitourinary    Burning when urinating:     Blood in urine:        Psychiatric    Major depression:         Hematologic    Bleeding problems:    Problems with blood clotting too easily:        Skin    Rashes or ulcers:        Constitutional    Fever or chills:      PHYSICAL EXAMINATION:  Vitals:   08/01/19 1152  BP: (!) 134/57  Pulse: 86  Resp: 16  Temp: (!) 95.7 F (35.4 C)  TempSrc: Temporal  SpO2: 100%  Weight: 156 lb 8 oz (71 kg)  Height: 5' 4.75" (1.645  m)    General:  WDWN in NAD; vital signs documented above Gait: Not observed HENT: WNL, normocephalic Pulmonary: normal non-labored breathing , without Rales, rhonchi,  wheezing Cardiac: regular HR, without  Murmurs without carotid bruits Abdomen: soft, NT, no masses Skin: without rashes Extremities: with chronic ischemic changes, without Gangrene , without cellulitis; without open wounds; her left fifth ray amputation site is well-healed.  There are several small bullous lesions of the left anterior lower leg.  She has a plantar callus on the right with a small fissure.  There is no drainage or signs of cellulitis.  Previous amputation sites are all well-healed. Musculoskeletal: Charcot deformity of both feet. Neurologic: A&O X 3;  No focal weakness.  She has lack of fine touch sensation in both feet Psychiatric:  The pt has Normal affect.  Pulse exam: 2+ brachial radial pulses bilaterally.  2+ left popliteal/graft pulse. Good thrill and bruit in right forearm fistula     Non-Invasive Vascular Imaging:   ABI/TBIToday's ABIToday's TBIPrevious ABIPrevious TBI  +-------+-----------+-----------+------------+------------+  Right [place]     0.39    [place]     not obtained  +-------+-----------+-----------+------------+------------+  Left  [place]     0.79    [place]     not  obtained  Right great toe pressure equals 55 mm Hg Left great toe pressure equals 112 mmHg  Arteriogram 03/25/2019: Right lower extremity arteriogram shows again at least 50% common femoral artery stenosis with dominant runoff via the profunda. There is a very tiny SFA stump with large calcified plaquebut then it occludes shortly after takeoff. Again the SFA is occluded throughout its entire segment. She does reconstitute heavily diseased below-knee popliteal artery with near occlusive plaque and single-vessel runoff via an anterior tibial artery.  ASSESSMENT/PLAN:: 74 y.o. female here for follow up for left femoral endarterectomy and femoral to popliteal bypass with vein.  She is now 4 months postop.  Her incisions are all well-healed and her lower extremities are well perfused.  We discussed the bullous lesions on her left leg and need to monitor these for any breakdown.  Continue compression stockings.  Continue daily aspirin.  Right common femoral artery stenosis of approximate 50%.  Occluded SFA, left popliteal artery disease with single-vessel runoff via anterior tibial artery.  Continue to monitor for signs and symptoms of ischemia.  Follow-up in 6 months with noninvasive vascular studies  Barbie Banner, PA-C [facility] [phone]  Clinic MD:   [name]

## 2019-08-02 DIAGNOSIS — Z992 Dependence on renal dialysis: Secondary | ICD-10-CM | POA: Diagnosis not present

## 2019-08-02 DIAGNOSIS — D509 Iron deficiency anemia, unspecified: Secondary | ICD-10-CM | POA: Diagnosis not present

## 2019-08-02 DIAGNOSIS — N186 End stage renal disease: Secondary | ICD-10-CM | POA: Diagnosis not present

## 2019-08-02 DIAGNOSIS — D631 Anemia in chronic kidney disease: Secondary | ICD-10-CM | POA: Diagnosis not present

## 2019-08-02 DIAGNOSIS — R519 Headache, unspecified: Secondary | ICD-10-CM | POA: Insufficient documentation

## 2019-08-02 DIAGNOSIS — E876 Hypokalemia: Secondary | ICD-10-CM | POA: Diagnosis not present

## 2019-08-02 DIAGNOSIS — N2581 Secondary hyperparathyroidism of renal origin: Secondary | ICD-10-CM | POA: Diagnosis not present

## 2019-08-03 ENCOUNTER — Ambulatory Visit: Payer: Medicare Other | Admitting: Internal Medicine

## 2019-08-04 DIAGNOSIS — D631 Anemia in chronic kidney disease: Secondary | ICD-10-CM | POA: Diagnosis not present

## 2019-08-04 DIAGNOSIS — N2581 Secondary hyperparathyroidism of renal origin: Secondary | ICD-10-CM | POA: Diagnosis not present

## 2019-08-04 DIAGNOSIS — N186 End stage renal disease: Secondary | ICD-10-CM | POA: Diagnosis not present

## 2019-08-04 DIAGNOSIS — Z992 Dependence on renal dialysis: Secondary | ICD-10-CM | POA: Diagnosis not present

## 2019-08-04 DIAGNOSIS — E876 Hypokalemia: Secondary | ICD-10-CM | POA: Diagnosis not present

## 2019-08-04 DIAGNOSIS — D509 Iron deficiency anemia, unspecified: Secondary | ICD-10-CM | POA: Diagnosis not present

## 2019-08-06 DIAGNOSIS — N186 End stage renal disease: Secondary | ICD-10-CM | POA: Diagnosis not present

## 2019-08-06 DIAGNOSIS — E1122 Type 2 diabetes mellitus with diabetic chronic kidney disease: Secondary | ICD-10-CM | POA: Diagnosis not present

## 2019-08-06 DIAGNOSIS — N2581 Secondary hyperparathyroidism of renal origin: Secondary | ICD-10-CM | POA: Diagnosis not present

## 2019-08-06 DIAGNOSIS — Z992 Dependence on renal dialysis: Secondary | ICD-10-CM | POA: Diagnosis not present

## 2019-08-06 DIAGNOSIS — D509 Iron deficiency anemia, unspecified: Secondary | ICD-10-CM | POA: Diagnosis not present

## 2019-08-09 DIAGNOSIS — N186 End stage renal disease: Secondary | ICD-10-CM | POA: Diagnosis not present

## 2019-08-09 DIAGNOSIS — N2581 Secondary hyperparathyroidism of renal origin: Secondary | ICD-10-CM | POA: Diagnosis not present

## 2019-08-09 DIAGNOSIS — D509 Iron deficiency anemia, unspecified: Secondary | ICD-10-CM | POA: Diagnosis not present

## 2019-08-09 DIAGNOSIS — Z992 Dependence on renal dialysis: Secondary | ICD-10-CM | POA: Diagnosis not present

## 2019-08-11 DIAGNOSIS — D509 Iron deficiency anemia, unspecified: Secondary | ICD-10-CM | POA: Diagnosis not present

## 2019-08-11 DIAGNOSIS — N2581 Secondary hyperparathyroidism of renal origin: Secondary | ICD-10-CM | POA: Diagnosis not present

## 2019-08-11 DIAGNOSIS — N186 End stage renal disease: Secondary | ICD-10-CM | POA: Diagnosis not present

## 2019-08-11 DIAGNOSIS — Z992 Dependence on renal dialysis: Secondary | ICD-10-CM | POA: Diagnosis not present

## 2019-08-12 ENCOUNTER — Other Ambulatory Visit: Payer: Self-pay | Admitting: Internal Medicine

## 2019-08-12 DIAGNOSIS — I10 Essential (primary) hypertension: Secondary | ICD-10-CM

## 2019-08-12 DIAGNOSIS — I25810 Atherosclerosis of coronary artery bypass graft(s) without angina pectoris: Secondary | ICD-10-CM

## 2019-08-13 DIAGNOSIS — D509 Iron deficiency anemia, unspecified: Secondary | ICD-10-CM | POA: Diagnosis not present

## 2019-08-13 DIAGNOSIS — N2581 Secondary hyperparathyroidism of renal origin: Secondary | ICD-10-CM | POA: Diagnosis not present

## 2019-08-13 DIAGNOSIS — N186 End stage renal disease: Secondary | ICD-10-CM | POA: Diagnosis not present

## 2019-08-13 DIAGNOSIS — Z992 Dependence on renal dialysis: Secondary | ICD-10-CM | POA: Diagnosis not present

## 2019-08-15 DIAGNOSIS — M5136 Other intervertebral disc degeneration, lumbar region: Secondary | ICD-10-CM | POA: Diagnosis not present

## 2019-08-15 DIAGNOSIS — M9901 Segmental and somatic dysfunction of cervical region: Secondary | ICD-10-CM | POA: Diagnosis not present

## 2019-08-15 DIAGNOSIS — M9903 Segmental and somatic dysfunction of lumbar region: Secondary | ICD-10-CM | POA: Diagnosis not present

## 2019-08-15 DIAGNOSIS — M40292 Other kyphosis, cervical region: Secondary | ICD-10-CM | POA: Diagnosis not present

## 2019-08-16 DIAGNOSIS — N2581 Secondary hyperparathyroidism of renal origin: Secondary | ICD-10-CM | POA: Diagnosis not present

## 2019-08-16 DIAGNOSIS — Z992 Dependence on renal dialysis: Secondary | ICD-10-CM | POA: Diagnosis not present

## 2019-08-16 DIAGNOSIS — N186 End stage renal disease: Secondary | ICD-10-CM | POA: Diagnosis not present

## 2019-08-16 DIAGNOSIS — D509 Iron deficiency anemia, unspecified: Secondary | ICD-10-CM | POA: Diagnosis not present

## 2019-08-17 DIAGNOSIS — M40292 Other kyphosis, cervical region: Secondary | ICD-10-CM | POA: Diagnosis not present

## 2019-08-17 DIAGNOSIS — M9903 Segmental and somatic dysfunction of lumbar region: Secondary | ICD-10-CM | POA: Diagnosis not present

## 2019-08-17 DIAGNOSIS — M9901 Segmental and somatic dysfunction of cervical region: Secondary | ICD-10-CM | POA: Diagnosis not present

## 2019-08-17 DIAGNOSIS — M5136 Other intervertebral disc degeneration, lumbar region: Secondary | ICD-10-CM | POA: Diagnosis not present

## 2019-08-18 DIAGNOSIS — N2581 Secondary hyperparathyroidism of renal origin: Secondary | ICD-10-CM | POA: Diagnosis not present

## 2019-08-18 DIAGNOSIS — D509 Iron deficiency anemia, unspecified: Secondary | ICD-10-CM | POA: Diagnosis not present

## 2019-08-18 DIAGNOSIS — Z992 Dependence on renal dialysis: Secondary | ICD-10-CM | POA: Diagnosis not present

## 2019-08-18 DIAGNOSIS — N186 End stage renal disease: Secondary | ICD-10-CM | POA: Diagnosis not present

## 2019-08-19 DIAGNOSIS — M5136 Other intervertebral disc degeneration, lumbar region: Secondary | ICD-10-CM | POA: Diagnosis not present

## 2019-08-19 DIAGNOSIS — M9903 Segmental and somatic dysfunction of lumbar region: Secondary | ICD-10-CM | POA: Diagnosis not present

## 2019-08-19 DIAGNOSIS — M40292 Other kyphosis, cervical region: Secondary | ICD-10-CM | POA: Diagnosis not present

## 2019-08-19 DIAGNOSIS — M9901 Segmental and somatic dysfunction of cervical region: Secondary | ICD-10-CM | POA: Diagnosis not present

## 2019-08-20 DIAGNOSIS — N2581 Secondary hyperparathyroidism of renal origin: Secondary | ICD-10-CM | POA: Diagnosis not present

## 2019-08-20 DIAGNOSIS — D509 Iron deficiency anemia, unspecified: Secondary | ICD-10-CM | POA: Diagnosis not present

## 2019-08-20 DIAGNOSIS — Z992 Dependence on renal dialysis: Secondary | ICD-10-CM | POA: Diagnosis not present

## 2019-08-20 DIAGNOSIS — N186 End stage renal disease: Secondary | ICD-10-CM | POA: Diagnosis not present

## 2019-08-22 DIAGNOSIS — M9903 Segmental and somatic dysfunction of lumbar region: Secondary | ICD-10-CM | POA: Diagnosis not present

## 2019-08-22 DIAGNOSIS — M5136 Other intervertebral disc degeneration, lumbar region: Secondary | ICD-10-CM | POA: Diagnosis not present

## 2019-08-22 DIAGNOSIS — M40292 Other kyphosis, cervical region: Secondary | ICD-10-CM | POA: Diagnosis not present

## 2019-08-22 DIAGNOSIS — M9901 Segmental and somatic dysfunction of cervical region: Secondary | ICD-10-CM | POA: Diagnosis not present

## 2019-08-23 DIAGNOSIS — Z992 Dependence on renal dialysis: Secondary | ICD-10-CM | POA: Diagnosis not present

## 2019-08-23 DIAGNOSIS — N186 End stage renal disease: Secondary | ICD-10-CM | POA: Diagnosis not present

## 2019-08-23 DIAGNOSIS — N2581 Secondary hyperparathyroidism of renal origin: Secondary | ICD-10-CM | POA: Diagnosis not present

## 2019-08-23 DIAGNOSIS — D509 Iron deficiency anemia, unspecified: Secondary | ICD-10-CM | POA: Diagnosis not present

## 2019-08-24 DIAGNOSIS — M40292 Other kyphosis, cervical region: Secondary | ICD-10-CM | POA: Diagnosis not present

## 2019-08-24 DIAGNOSIS — M9903 Segmental and somatic dysfunction of lumbar region: Secondary | ICD-10-CM | POA: Diagnosis not present

## 2019-08-24 DIAGNOSIS — M9901 Segmental and somatic dysfunction of cervical region: Secondary | ICD-10-CM | POA: Diagnosis not present

## 2019-08-24 DIAGNOSIS — M5136 Other intervertebral disc degeneration, lumbar region: Secondary | ICD-10-CM | POA: Diagnosis not present

## 2019-08-25 DIAGNOSIS — Z992 Dependence on renal dialysis: Secondary | ICD-10-CM | POA: Diagnosis not present

## 2019-08-25 DIAGNOSIS — D509 Iron deficiency anemia, unspecified: Secondary | ICD-10-CM | POA: Diagnosis not present

## 2019-08-25 DIAGNOSIS — N2581 Secondary hyperparathyroidism of renal origin: Secondary | ICD-10-CM | POA: Diagnosis not present

## 2019-08-25 DIAGNOSIS — N186 End stage renal disease: Secondary | ICD-10-CM | POA: Diagnosis not present

## 2019-08-27 DIAGNOSIS — N186 End stage renal disease: Secondary | ICD-10-CM | POA: Diagnosis not present

## 2019-08-27 DIAGNOSIS — Z992 Dependence on renal dialysis: Secondary | ICD-10-CM | POA: Diagnosis not present

## 2019-08-27 DIAGNOSIS — N2581 Secondary hyperparathyroidism of renal origin: Secondary | ICD-10-CM | POA: Diagnosis not present

## 2019-08-27 DIAGNOSIS — D509 Iron deficiency anemia, unspecified: Secondary | ICD-10-CM | POA: Diagnosis not present

## 2019-08-29 DIAGNOSIS — M9901 Segmental and somatic dysfunction of cervical region: Secondary | ICD-10-CM | POA: Diagnosis not present

## 2019-08-29 DIAGNOSIS — M9903 Segmental and somatic dysfunction of lumbar region: Secondary | ICD-10-CM | POA: Diagnosis not present

## 2019-08-29 DIAGNOSIS — M5136 Other intervertebral disc degeneration, lumbar region: Secondary | ICD-10-CM | POA: Diagnosis not present

## 2019-08-29 DIAGNOSIS — M40292 Other kyphosis, cervical region: Secondary | ICD-10-CM | POA: Diagnosis not present

## 2019-08-30 DIAGNOSIS — Z992 Dependence on renal dialysis: Secondary | ICD-10-CM | POA: Diagnosis not present

## 2019-08-30 DIAGNOSIS — N186 End stage renal disease: Secondary | ICD-10-CM | POA: Diagnosis not present

## 2019-08-30 DIAGNOSIS — N2581 Secondary hyperparathyroidism of renal origin: Secondary | ICD-10-CM | POA: Diagnosis not present

## 2019-08-30 DIAGNOSIS — D509 Iron deficiency anemia, unspecified: Secondary | ICD-10-CM | POA: Diagnosis not present

## 2019-08-31 DIAGNOSIS — M40292 Other kyphosis, cervical region: Secondary | ICD-10-CM | POA: Diagnosis not present

## 2019-08-31 DIAGNOSIS — M9901 Segmental and somatic dysfunction of cervical region: Secondary | ICD-10-CM | POA: Diagnosis not present

## 2019-08-31 DIAGNOSIS — M5136 Other intervertebral disc degeneration, lumbar region: Secondary | ICD-10-CM | POA: Diagnosis not present

## 2019-08-31 DIAGNOSIS — M9903 Segmental and somatic dysfunction of lumbar region: Secondary | ICD-10-CM | POA: Diagnosis not present

## 2019-09-01 DIAGNOSIS — N186 End stage renal disease: Secondary | ICD-10-CM | POA: Diagnosis not present

## 2019-09-01 DIAGNOSIS — Z992 Dependence on renal dialysis: Secondary | ICD-10-CM | POA: Diagnosis not present

## 2019-09-01 DIAGNOSIS — N2581 Secondary hyperparathyroidism of renal origin: Secondary | ICD-10-CM | POA: Diagnosis not present

## 2019-09-01 DIAGNOSIS — D509 Iron deficiency anemia, unspecified: Secondary | ICD-10-CM | POA: Diagnosis not present

## 2019-09-03 DIAGNOSIS — N186 End stage renal disease: Secondary | ICD-10-CM | POA: Diagnosis not present

## 2019-09-03 DIAGNOSIS — Z992 Dependence on renal dialysis: Secondary | ICD-10-CM | POA: Diagnosis not present

## 2019-09-03 DIAGNOSIS — D509 Iron deficiency anemia, unspecified: Secondary | ICD-10-CM | POA: Diagnosis not present

## 2019-09-03 DIAGNOSIS — N2581 Secondary hyperparathyroidism of renal origin: Secondary | ICD-10-CM | POA: Diagnosis not present

## 2019-09-12 DIAGNOSIS — Z1231 Encounter for screening mammogram for malignant neoplasm of breast: Secondary | ICD-10-CM | POA: Diagnosis not present

## 2019-09-12 DIAGNOSIS — M8589 Other specified disorders of bone density and structure, multiple sites: Secondary | ICD-10-CM | POA: Diagnosis not present

## 2019-09-12 DIAGNOSIS — Z780 Asymptomatic menopausal state: Secondary | ICD-10-CM | POA: Diagnosis not present

## 2019-09-12 LAB — HM MAMMOGRAPHY

## 2019-09-12 LAB — HM DEXA SCAN

## 2019-09-13 ENCOUNTER — Encounter: Payer: Self-pay | Admitting: *Deleted

## 2019-09-14 ENCOUNTER — Encounter: Payer: Self-pay | Admitting: *Deleted

## 2019-09-19 ENCOUNTER — Ambulatory Visit: Payer: Medicare Other | Admitting: Orthotics

## 2019-09-19 ENCOUNTER — Other Ambulatory Visit: Payer: Self-pay

## 2019-09-19 ENCOUNTER — Encounter: Payer: Self-pay | Admitting: Podiatry

## 2019-09-19 ENCOUNTER — Ambulatory Visit (INDEPENDENT_AMBULATORY_CARE_PROVIDER_SITE_OTHER): Payer: Medicare Other | Admitting: Podiatry

## 2019-09-19 DIAGNOSIS — L97519 Non-pressure chronic ulcer of other part of right foot with unspecified severity: Secondary | ICD-10-CM

## 2019-09-19 DIAGNOSIS — M79674 Pain in right toe(s): Secondary | ICD-10-CM

## 2019-09-19 DIAGNOSIS — B351 Tinea unguium: Secondary | ICD-10-CM

## 2019-09-19 DIAGNOSIS — L03115 Cellulitis of right lower limb: Secondary | ICD-10-CM

## 2019-09-19 DIAGNOSIS — E1149 Type 2 diabetes mellitus with other diabetic neurological complication: Secondary | ICD-10-CM | POA: Diagnosis not present

## 2019-09-19 DIAGNOSIS — E11621 Type 2 diabetes mellitus with foot ulcer: Secondary | ICD-10-CM | POA: Diagnosis not present

## 2019-09-19 DIAGNOSIS — M79675 Pain in left toe(s): Secondary | ICD-10-CM

## 2019-09-19 DIAGNOSIS — L84 Corns and callosities: Secondary | ICD-10-CM

## 2019-09-19 DIAGNOSIS — I739 Peripheral vascular disease, unspecified: Secondary | ICD-10-CM

## 2019-09-19 NOTE — Progress Notes (Signed)
Patient molded for diabetic inserts only to go in her NB shoes.

## 2019-09-27 NOTE — Progress Notes (Signed)
Subjective: 74 year old female presents the office today for diabetic foot exam, nail trim.  She states that she was cleaning the cuticle toenail of the left third toenail came off yesterday.  Is been bleeding some.  Denies any pus.  Also the callus on the bottom of the right foot is been getting thicker again.  Denies any drainage or pus coming from this area.  No increase in swelling to both feet. Denies any systemic complaints such as fevers, chills, nausea, vomiting. No acute changes since last appointment, and no other complaints at this time.   Objective: AAO x3, NAD Pulses decreased.  Sensation decreased with [place] monofilament. Nails are hypertrophic, dystrophic, discolored.  The left side 1 and 3, right 1,3,4.  No edema, erythema or signs of infection.  Granular wound to the distal aspect of the left third toe with the nail previously came off.  Appears to be clean without any signs of infection.  Hyperkeratotic lesion right foot plantar midfoot with small superficial area of skin breakdown centrally.  There is no probing, undermining or tunneling.  Wound base is granular.  No open lesions or pre-ulcerative lesions.  No pain with calf compression, swelling, warmth, erythema  Assessment: 74 year old female with diabetes with neuropathy, PAD presents for symptomatic onychomycosis, superficial wound right foot  Plan: -All treatment options discussed with the patient including all alternatives, risks, complications.  -Nails debrided x5 without complications or bleeding.  Nailbed intact to the left third toe with toenail came off.  Recommended a small amount of antibiotic ointment daily. -Hyperkeratotic lesion debrided x1 to reveal a superficial granular wound.  This was debrided with a #[id number] with scalpel down to healthy granular tissue which is viable.  We will continue with Iodosorb dressing changes daily.  Continue offloading. -Patient encouraged to call the office with any questions,  concerns, change in symptoms.   Return in about 3 weeks (around 10/10/2019).  [name] DPM

## 2019-10-02 ENCOUNTER — Encounter: Payer: Self-pay | Admitting: Internal Medicine

## 2019-10-02 NOTE — Patient Instructions (Signed)

## 2019-10-02 NOTE — Progress Notes (Signed)
History of Present Illness:       This very nice 74 y.o. MWF  presents for 6 month follow up with HTN, HLD,  Insulin requiring T2_DM, ESRD /Dialysis  and Vitamin D Deficiency.  Patient's Gout is controlled on her meds. 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        Patient also is c/o LBP & Sciatica and she requested a Rx Hydrocodone to take.                                                                                                                                                                                                                                                                                                                 Patient is treated for HTN (2000)  & BP has been controlled at home. Today's  BP is at goal - 100/62.  In 2014, she underwent CABG x 4V and in 2019 she was hospitalized w/CHF. Patient has had no complaints of any cardiac type chest pain, palpitations, dyspnea / orthopnea / PND, dizziness, claudication, or dependent edema.      Hyperlipidemia is controlled with diet & meds. Patient denies myalgias or other med SE's. Last Lipids were at goal:  Lab Results  Component Value Date   CHOL 124 06/15/2019   HDL 56 06/15/2019   LDLCALC 52 06/15/2019   TRIG 77 06/15/2019   CHOLHDL 2.2 06/15/2019    Also, the patient has history of T2_DM circa 2000- and transitioned to Insulin in 2015.  In Sept 2019 she was initiated on Dialysis as management also of her CHF.  She continues out-patient Dialysis 3 /week. She indicates that her new Nephrologist is Dr Lawson Radar. She has Diabetic neuropathy.  She has lost R&L 5th and R 2sd & L 3sd toes due to ASPVD & Osteomyelitis. She is currently undergoing fitting for an orthotic insert for her shoes.   Currently she is on Nov 70/30  bid usually taking 10-20 units bid pending her food intake & activities. She has had no symptoms of reactive hypoglycemia, diabetic polys or  visual blurring.  She alleges that her CBG's usually range 110-120 mg%.  Her last A1c was at goal:  Lab Results  Component Value Date   HGBA1C 5.9 (H) 06/15/2019           Further, the patient also has history of Vitamin D Deficiency ("25" / 2008, "32" / 2016 and "34" / 2018)  and supplements vitamin D without any suspected side-effects. Last vitamin D was at goal:   Lab Results  Component Value Date   VD25OH 50 01/10/2019    Current Outpatient Medications on File Prior to Visit  Medication Sig  . allopurinol (ZYLOPRIM) 100 MG tablet Take 1 tablet 2 x /day to Prevent Gout (Patient taking differently: Take 100 mg by mouth 2 (two) times daily. )  . ARTIFICIAL TEAR SOLUTION OP Place 1 drop into both eyes in the morning and at bedtime.  Marland Kitchen aspirin EC 81 MG tablet Take 1 tablet (81 mg total) by mouth daily.  Marland Kitchen aspirin-acetaminophen-caffeine (EXCEDRIN EXTRA STRENGTH) 250-250-65 MG tablet Take 1 tablet by mouth 2 (two) times daily as needed (pain).  Marland Kitchen b complex-vitamin c-folic acid (NEPHRO-VITE) 0.8 MG TABS tablet Take 1 tablet by mouth daily.   . Bisacodyl (LAXATIVE PO) Take 1 capsule by mouth 3 (three) times daily.  . cinacalcet (SENSIPAR) 30 MG tablet Take 1 tablet (30 mg total) by mouth daily with supper.  . cyclobenzaprine (FLEXERIL) 5 MG tablet Take 1 tablet (5 mg total) by mouth 3 (three) times daily as needed for muscle spasms.  . Darbepoetin Alfa (ARANESP) 100 MCG/0.5ML SOSY injection Inject 0.5 mLs (100 mcg total) into the vein every Tuesday with hemodialysis.  Marland Kitchen dextrose 50 % solution Dextrose 50%  . docusate sodium (COLACE) 100 MG capsule Take 200 mg by mouth 2 (two) times daily.   Marland Kitchen doxercalciferol (HECTOROL) 4 MCG/2ML injection Inject 2 mLs (4 mcg total) into the vein Every Tuesday,Thursday,and Saturday with dialysis.  . ferric gluconate 125 mg in sodium chloride 0.9 % 100 mL Inject 125 mg into the vein Every Tuesday,Thursday,and Saturday with dialysis.  Marland Kitchen gabapentin (NEURONTIN)  100 MG capsule Take 1 capsule (100 mg total) by mouth 2 (two) times daily. (Patient taking differently: Take 100 mg by mouth as needed. )  . Homeopathic Products (THERAWORX RELIEF EX) Apply 1 application topically daily as needed (pain).  . hydrALAZINE (APRESOLINE) 25 MG tablet Take 3 tablets (75 mg total) by mouth every 8 (eight) hours.  Marland Kitchen HYDROcodone-acetaminophen (NORCO) 5-325 MG tablet Take 1 tablet by mouth every 6 (six) hours as needed for moderate pain.  Marland Kitchen insulin NPH-regular Human (70-30) 100 UNIT/ML injection Inject 10-30 Units into the skin 2 (two) times daily with a meal.   . isosorbide dinitrate (ISORDIL) 10 MG tablet TAKE 1 TABLET THREE TIMES A DAY FOR BLOOD PRESSURE AND HEART  . Methoxy PEG-Epoetin Beta (MIRCERA IJ) Mircera  . Multiple Vitamins-Minerals (PRESERVISION AREDS 2 PO) Take 2 capsules by mouth daily at 12 noon.   . predniSONE (DELTASONE) 20 MG tablet   . Probiotic Product ([place]) CAPS Take 1 capsule by mouth in the morning and at bedtime.  . sevelamer carbonate (RENVELA) 800 MG tablet Take 2 tablets (1,600 mg total) by mouth 3 (three) times daily with meals. (Patient taking differently: Take 1,600 mg by mouth See admin instructions. Take 1600 mg with each meal  and each snack)  . traZODone (DESYREL) 50 MG tablet Take 50 mg by mouth at bedtime as needed for sleep.   No current facility-administered medications on file prior to visit.    Allergies  Allergen Reactions  . Atenolol Rash and Other (See Comments)    Exacerbates gout   . Adhesive [Tape] Other (See Comments)    SKIN IS VERY SENSITIVE AND BRUISES AND TEARS EASILY; PLEASE USE AN ALTERNATIVE TO TAPE!!  . Contrast Media [Iodinated Diagnostic Agents] Rash  . Iohexol Rash  . Latex Rash    PMHx:   Past Medical History:  Diagnosis Date  . Allergy   . Arthritis    "maybe in my lower back" (08/27/2012)  . 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  Chronic combined systolic and diastolic CHF (congestive heart failure) ([place])   . CKD (chronic kidney disease), stage IV ([place])   . Coronary artery disease    a. NSTEMI in 05/2009 s/p CABG (LIMA-LAD, SVG-diagonal, SVG-OM1/OM 2).   Marland Kitchen Dyspnea    with exertion  . Family history of anesthesia complication    Mom has a hard time to wake up  . Foot ulcer ([place])    "I've had them on both feet" (08/27/2012)  . Gouty arthritis    "right index finger" (08/27/2012)  . History of radiation therapy 06/09/13-07/06/13   left breast 50Gy  . Hyperlipidemia   . Hypertension   . Infection    right second toe  . Ischemic cardiomyopathy    a. 2011: EF 40-45%. b. EF 55-60% in 04/2017 but shortly after as inpatient was 45-50%.  Marland Kitchen LBBB (left bundle branch block)   . Mitral regurgitation   . Neuropathy    Hx; of B/L feet  . NSTEMI (non-ST elevated myocardial infarction) ([place]) 05/23/2009  . Osteomyelitis of foot ([place])   . PAD (peripheral artery disease) (HCC)    a. LE PAD (patient previously elected hold off L fem-pop), prev followed by Dr. Bridgett Larsson  . PONV (postoperative nausea and vomiting)   . Sinus headache    "occasionally" (08/27/2012)  . Type II diabetes mellitus ([place])   . Vitamin D deficiency     Immunization History  Administered Date(s) Administered  . Influenza, High Dose Seasonal PF 01/30/2014, 02/14/2015, 12/17/2015, 01/22/2017, 01/07/2018  . Influenza-Unspecified 02/05/2013  . Pneumococcal Conjugate-13 01/30/2014  . Pneumococcal Polysaccharide-23 01/07/2011  . Td 01/13/2012  . Zoster 01/13/2012    Past Surgical History:  Procedure Laterality Date  . ABDOMINAL AORTOGRAM W/LOWER EXTREMITY Bilateral 03/25/2019   Procedure: ABDOMINAL AORTOGRAM W/LOWER EXTREMITY;  Surgeon: Marty Heck, MD;  Location: [facility];  Service: Cardiovascular;  Laterality: Bilateral;  . AMPUTATION Right 08/27/2012   Procedure: AMPUTATION RAY;  Surgeon: Newt Minion, MD;  Location: [facility];  Service:  Orthopedics;  Laterality: Right;  Right Foot 5th Ray Amputation  . AMPUTATION Left 07/08/2013   Procedure: AMPUTATION RAY;  Surgeon: Newt Minion, MD;  Location: [facility];  Service: Orthopedics;  Laterality: Left;  Left Foot 2nd Ray Amputation  . AMPUTATION Left 04/06/2019   Procedure: LEFT 5TH RAY AMPUTATION;  Surgeon: Newt Minion, MD;  Location: [facility];  Service: Orthopedics;  Laterality: Left;  . AMPUTATION RAY Right 08/27/2012   5th ray/notes 08/27/2012  . AMPUTATION TOE Right 03/06/2017   Procedure: AMPUTATION TOE, INTERPHANGEAL 2ND RIGHT;  Surgeon: [name], DPM;  Location: [facility];  Service: Podiatry;  Laterality: Right;  .  AV FISTULA PLACEMENT Right 11/11/2017   Procedure: RIGHT RADIOCEPHALIC  ARTERIOVENOUS FISTULA;  Surgeon: [name], MD;  Location: [facility];  Service: Vascular;  Laterality: Right;  . BREAST LUMPECTOMY  04/20/2013   with biopsy      DR WAKEFIELD  . BREAST LUMPECTOMY WITH NEEDLE LOCALIZATION AND AXILLARY SENTINEL LYMPH NODE BX Left 04/20/2013   Procedure: LEFT BREAST WIRE GUIDED LUMPECTOMY AND AXILLARY SENTINEL LYMPH NODE BX;  Surgeon: Rolm Bookbinder, MD;  Location: [facility];  Service: General;  Laterality: Left;  . CARDIAC CATHETERIZATION  05/24/2009   Archie Endo 05/24/2009 (08/27/2012)  . CATARACT EXTRACTION W/ INTRAOCULAR LENS  IMPLANT, BILATERAL  2000's  . COLONOSCOPY W/ BIOPSIES AND POLYPECTOMY  2010  . CORONARY ARTERY BYPASS GRAFT  2011   "CABG X4" (08/27/2012)  . DENTAL SURGERY  04/30/11   "1 implant" (08/27/2012)  . DILATION AND CURETTAGE OF UTERUS    . ENDARTERECTOMY FEMORAL Left 03/28/2019   Procedure: ENDARTERECTOMY FEMORAL;  Surgeon: [name], MD;  Location: [facility];  Service: Vascular;  Laterality: Left;  . EYE SURGERY    . FEMORAL-POPLITEAL BYPASS GRAFT Left 03/28/2019   Procedure: BYPASS GRAFT FEMORAL-POPLITEAL ARTERY;  Surgeon: [name], MD;  Location: [facility];  Service: Vascular;  Laterality: Left;  . FINGER SURGERY Right    "index finger; turned  out to be gout" (08/27/2012)  . IR FLUORO GUIDE CV LINE RIGHT  11/30/2017  . PATCH ANGIOPLASTY Left 03/28/2019   Procedure: Patch Angioplasty;  Surgeon: [name], MD;  Location: [facility];  Service: Vascular;  Laterality: Left;  . SHOULDER CLOSED REDUCTION Left 05/27/2019   Procedure: CLOSED REDUCTION LEFT SHOULDER;  Surgeon: Mcarthur Rossetti, MD;  Location: [facility];  Service: Orthopedics;  Laterality: Left;  . TEE WITHOUT CARDIOVERSION N/A 12/01/2017   Procedure: TRANSESOPHAGEAL ECHOCARDIOGRAM (TEE);  Surgeon: Lelon Perla, MD;  [facility];  Service: Cardiovascular;  Laterality: N/A;    FHx:    Reviewed / unchanged  SHx:    Reviewed / unchanged   Systems Review:  Constitutional: Denies fever, chills, wt changes, headaches, insomnia, fatigue, night sweats, change in appetite. Eyes: Denies redness, blurred vision, diplopia, discharge, itchy, watery eyes.  ENT: Denies discharge, congestion, post nasal drip, epistaxis, sore throat, earache, hearing loss, dental pain, tinnitus, vertigo, sinus pain, snoring.  CV: Denies chest pain, palpitations, irregular heartbeat, syncope, dyspnea, diaphoresis, orthopnea, PND, claudication or edema. Respiratory: denies cough, dyspnea, DOE, pleurisy, hoarseness, laryngitis, wheezing.  Gastrointestinal: Denies dysphagia, odynophagia, heartburn, reflux, water brash, abdominal pain or cramps, nausea, vomiting, bloating, diarrhea, constipation, hematemesis, melena, hematochezia  or hemorrhoids. Genitourinary: Denies dysuria, frequency, urgency, nocturia, hesitancy, discharge, hematuria or flank pain. Musculoskeletal: Denies arthralgias, myalgias, stiffness, jt. swelling, pain, limping or strain/sprain.  Skin: Denies pruritus, rash, hives, warts, acne, eczema or change in skin lesion(s). Neuro: No weakness, tremor, incoordination, spasms, paresthesia or pain. Psychiatric: Denies confusion, memory loss or sensory loss. Endo: Denies change in  weight, skin or hair change.  Heme/Lymph: No excessive bleeding, bruising or enlarged lymph nodes.  Physical Exam  BP 100/62   Pulse 84   Temp 97.6 F (36.4 C)   Resp 16   Ht 5\' 5"  (1.651 m)   Wt 156 lb (70.8 kg)   BMI 25.96 kg/m   Appears over nourished  and in no distress.  Eyes: PERRLA, EOMs, conjunctiva no swelling or erythema. Sinuses: No frontal/maxillary tenderness ENT/Mouth: EAC's clear, TM's nl w/o erythema, bulging. Nares clear w/o erythema, swelling, exudates. Oropharynx  clear without erythema or exudates. Oral hygiene is good. Tongue normal, non obstructing. Hearing intact.  Neck: Supple. Thyroid not palpable. Car 2+/2+ without bruits, nodes or JVD. Chest: Respirations nl with BS clear & equal w/o rales, rhonchi, wheezing or stridor.  Cor: Heart sounds normal w/ regular rate and rhythm without sig. murmurs. Thrill and bruit in Rt forearm AVF.    Abdomen: Soft, protuberant & bowel sounds normal. Non-tender w/o guarding, rebound, hernias, masses or organomegaly.  Lymphatics: Unremarkable.  Musculoskeletal: Full ROM all peripheral extremities, joint stability, 5/5 strength and normal gait. Bilat pes planus, bilat hallux valgus and hammer toes with L 3rd & 5th & R 2sd & 5th toes absent. Skin: Warm, dry without exposed rashes, lesions or ecchymosis apparent.  Neuro: Cranial nerves intact, reflexes equal bilaterally. Motor testing grossly intact. Tendon reflexes flat. Sensation decreased to touch, vibratory and Monofilament to the toes bilaterally. Pysch: Alert & oriented x 3.  Insight and judgement nl & appropriate. No ideations.  Assessment and Plan:  1. Essential hypertension  - Continue medication, monitor blood pressure at home.  - Continue DASH diet.  Reminder to go to the ER if any CP,  SOB, nausea, dizziness, severe HA, changes vision/speech.  - CBC with Differential/Platelet - COMPLETE METABOLIC PANEL WITH GFR - Magnesium - TSH  2. Hyperlipidemia associated  with type 2 diabetes mellitus ([place])  - Continue diet/meds, exercise,& lifestyle modifications.  - Continue monitor periodic cholesterol/liver & renal functions   - Lipid panel - TSH  3. Diabetes mellitus due to underlying condition with chronic kidney  disease on chronic dialysis, with long-term current use of insulin (HCC)  - Continue diet, exercise  - Lifestyle modifications.  - Monitor appropriate labs.  - Hemoglobin A1c  4. Vitamin D deficiency  - Continue supplementation.  - VITAMIN D 25 Hydroxy  5. Gouty arthritis  - Uric acid  6. ESRD (end stage renal disease) (HCC)  - COMPLETE METABOLIC PANEL WITH GFR  7. Medication management  - CBC with Differential/Platelet - COMPLETE METABOLIC PANEL WITH GFR - Magnesium - Lipid panel - TSH - Hemoglobin A1c - VITAMIN D 25 Hydroxy - Uric acid        Discussed  regular exercise, BP monitoring, weight control to achieve/maintain BMI less than 25 and discussed med and SE's. Recommended labs to assess and monitor clinical status with further disposition pending results of labs.   Discussed with patient trying low dose Lyrica at recommended renal dosing. Also asked patient as to whether she might be allowed to take Meloxicam by her Nephrologist.  I discussed the assessment and treatment plan with the patient. The patient was provided an opportunity to ask questions and all were answered. The patient agreed with the plan and demonstrated an understanding of the instructions.  I provided over 30 minutes of exam, counseling, chart review and  complex critical decision making.   [name], MD

## 2019-10-03 ENCOUNTER — Other Ambulatory Visit: Payer: Self-pay | Admitting: *Deleted

## 2019-10-03 ENCOUNTER — Other Ambulatory Visit: Payer: Self-pay

## 2019-10-03 ENCOUNTER — Ambulatory Visit (INDEPENDENT_AMBULATORY_CARE_PROVIDER_SITE_OTHER): Payer: Medicare Other | Admitting: Internal Medicine

## 2019-10-03 VITALS — BP 100/62 | HR 84 | Temp 97.6°F | Resp 16 | Ht 65.0 in | Wt 156.0 lb

## 2019-10-03 DIAGNOSIS — E0822 Diabetes mellitus due to underlying condition with diabetic chronic kidney disease: Secondary | ICD-10-CM

## 2019-10-03 DIAGNOSIS — I10 Essential (primary) hypertension: Secondary | ICD-10-CM | POA: Diagnosis not present

## 2019-10-03 DIAGNOSIS — E559 Vitamin D deficiency, unspecified: Secondary | ICD-10-CM

## 2019-10-03 DIAGNOSIS — E1169 Type 2 diabetes mellitus with other specified complication: Secondary | ICD-10-CM

## 2019-10-03 DIAGNOSIS — E785 Hyperlipidemia, unspecified: Secondary | ICD-10-CM | POA: Diagnosis not present

## 2019-10-03 DIAGNOSIS — N186 End stage renal disease: Secondary | ICD-10-CM | POA: Diagnosis not present

## 2019-10-03 DIAGNOSIS — M109 Gout, unspecified: Secondary | ICD-10-CM

## 2019-10-03 DIAGNOSIS — Z992 Dependence on renal dialysis: Secondary | ICD-10-CM

## 2019-10-03 DIAGNOSIS — Z794 Long term (current) use of insulin: Secondary | ICD-10-CM | POA: Diagnosis not present

## 2019-10-03 DIAGNOSIS — Z79899 Other long term (current) drug therapy: Secondary | ICD-10-CM

## 2019-10-03 MED ORDER — PREGABALIN 25 MG PO CAPS: ORAL_CAPSULE | ORAL | 0 refills | Status: DC

## 2019-10-03 MED ORDER — ATORVASTATIN CALCIUM 10 MG PO TABS: ORAL_TABLET | ORAL | 1 refills | Status: DC

## 2019-10-04 ENCOUNTER — Telehealth: Payer: Self-pay | Admitting: *Deleted

## 2019-10-04 LAB — LIPID PANEL
Cholesterol: 99 mg/dL (ref <200)
HDL: 48 mg/dL — ABNORMAL LOW (ref >=50)
LDL Cholesterol (Calc): 35 mg/dL (calc)
Non-HDL Cholesterol (Calc): 51 mg/dL (calc) (ref <130)
Total CHOL/HDL Ratio: 2.1 (calc) (ref <5.0)
Triglycerides: 79 mg/dL (ref <150)

## 2019-10-04 LAB — COMPLETE METABOLIC PANEL WITH GFR
AG Ratio: 2 (calc) (ref 1.0–2.5)
ALT: 18 U/L (ref 6–29)
AST: 19 U/L (ref 10–35)
Albumin: 4.2 g/dL (ref 3.6–5.1)
Alkaline phosphatase (APISO): 146 U/L (ref 37–153)
BUN/Creatinine Ratio: 11 (calc) (ref 6–22)
BUN: 42 mg/dL — ABNORMAL HIGH (ref 7–25)
CO2: 35 mmol/L — ABNORMAL HIGH (ref 20–32)
Calcium: 10.2 mg/dL (ref 8.6–10.4)
Chloride: 93 mmol/L — ABNORMAL LOW (ref 98–110)
Creat: 3.91 mg/dL — ABNORMAL HIGH (ref 0.60–0.93)
GFR, Est African American: 12 mL/min/{1.73_m2} — ABNORMAL LOW (ref >=60)
GFR, Est Non African American: 11 mL/min/{1.73_m2} — ABNORMAL LOW (ref >=60)
Globulin: 2.1 g/dL (calc) (ref 1.9–3.7)
Glucose, Bld: 88 mg/dL (ref 65–99)
Potassium: 4.7 mmol/L (ref 3.5–5.3)
Sodium: 140 mmol/L (ref 135–146)
Total Bilirubin: 0.5 mg/dL (ref 0.2–1.2)
Total Protein: 6.3 g/dL (ref 6.1–8.1)

## 2019-10-04 LAB — CBC WITH DIFFERENTIAL/PLATELET
Absolute Monocytes: 1043 cells/uL — ABNORMAL HIGH (ref 200–950)
Basophils Absolute: 8 cells/uL (ref 0–200)
Basophils Relative: 0.1 %
Eosinophils Absolute: 120 cells/uL (ref 15–500)
Eosinophils Relative: 1.6 %
HCT: 36 % (ref 35.0–45.0)
Hemoglobin: 11.7 g/dL (ref 11.7–15.5)
Lymphs Abs: 1163 cells/uL (ref 850–3900)
MCH: 31.8 pg (ref 27.0–33.0)
MCHC: 32.5 g/dL (ref 32.0–36.0)
MCV: 97.8 fL (ref 80.0–100.0)
MPV: 10.6 fL (ref 7.5–12.5)
Monocytes Relative: 13.9 %
Neutro Abs: 5168 cells/uL (ref 1500–7800)
Neutrophils Relative %: 68.9 %
Platelets: 224 10*3/uL (ref 140–400)
RBC: 3.68 10*6/uL — ABNORMAL LOW (ref 3.80–5.10)
RDW: 13.7 % (ref 11.0–15.0)
Total Lymphocyte: 15.5 %
WBC: 7.5 10*3/uL (ref 3.8–10.8)

## 2019-10-04 LAB — VITAMIN D 25 HYDROXY (VIT D DEFICIENCY, FRACTURES): Vit D, 25-Hydroxy: 56 ng/mL (ref 30–100)

## 2019-10-04 LAB — URIC ACID: Uric Acid, Serum: 3.9 mg/dL (ref 2.5–7.0)

## 2019-10-04 LAB — TSH: TSH: 1.88 mIU/L (ref 0.40–4.50)

## 2019-10-04 LAB — HEMOGLOBIN A1C
Hgb A1c MFr Bld: 5.3 % of total Hgb (ref <5.7)
Mean Plasma Glucose: 105 (calc)
eAG (mmol/L): 5.8 (calc)

## 2019-10-04 LAB — MAGNESIUM: Magnesium: 2.8 mg/dL — ABNORMAL HIGH (ref 1.5–2.5)

## 2019-10-04 NOTE — Telephone Encounter (Signed)
Forms and face to face OV faxed for diabetic shoes.

## 2019-10-05 ENCOUNTER — Other Ambulatory Visit: Payer: Self-pay | Admitting: *Deleted

## 2019-10-05 MED ORDER — "SAFETY INSULIN SYRINGES 30G X 1/2"" 1 ML MISC": 3 refills | Status: DC

## 2019-10-06 DIAGNOSIS — D509 Iron deficiency anemia, unspecified: Secondary | ICD-10-CM | POA: Diagnosis not present

## 2019-10-06 DIAGNOSIS — N2581 Secondary hyperparathyroidism of renal origin: Secondary | ICD-10-CM | POA: Diagnosis not present

## 2019-10-06 DIAGNOSIS — E1122 Type 2 diabetes mellitus with diabetic chronic kidney disease: Secondary | ICD-10-CM | POA: Diagnosis not present

## 2019-10-06 DIAGNOSIS — Z992 Dependence on renal dialysis: Secondary | ICD-10-CM | POA: Diagnosis not present

## 2019-10-06 DIAGNOSIS — N186 End stage renal disease: Secondary | ICD-10-CM | POA: Diagnosis not present

## 2019-10-08 DIAGNOSIS — Z992 Dependence on renal dialysis: Secondary | ICD-10-CM | POA: Diagnosis not present

## 2019-10-08 DIAGNOSIS — N2581 Secondary hyperparathyroidism of renal origin: Secondary | ICD-10-CM | POA: Diagnosis not present

## 2019-10-08 DIAGNOSIS — N186 End stage renal disease: Secondary | ICD-10-CM | POA: Diagnosis not present

## 2019-10-08 DIAGNOSIS — D509 Iron deficiency anemia, unspecified: Secondary | ICD-10-CM | POA: Diagnosis not present

## 2019-10-11 DIAGNOSIS — D509 Iron deficiency anemia, unspecified: Secondary | ICD-10-CM | POA: Diagnosis not present

## 2019-10-11 DIAGNOSIS — N2581 Secondary hyperparathyroidism of renal origin: Secondary | ICD-10-CM | POA: Diagnosis not present

## 2019-10-11 DIAGNOSIS — N186 End stage renal disease: Secondary | ICD-10-CM | POA: Diagnosis not present

## 2019-10-11 DIAGNOSIS — Z992 Dependence on renal dialysis: Secondary | ICD-10-CM | POA: Diagnosis not present

## 2019-10-12 ENCOUNTER — Other Ambulatory Visit: Payer: Self-pay

## 2019-10-12 ENCOUNTER — Ambulatory Visit (INDEPENDENT_AMBULATORY_CARE_PROVIDER_SITE_OTHER): Payer: Medicare Other | Admitting: Podiatry

## 2019-10-12 DIAGNOSIS — E1149 Type 2 diabetes mellitus with other diabetic neurological complication: Secondary | ICD-10-CM

## 2019-10-12 DIAGNOSIS — L97411 Non-pressure chronic ulcer of right heel and midfoot limited to breakdown of skin: Secondary | ICD-10-CM | POA: Diagnosis not present

## 2019-10-13 ENCOUNTER — Encounter: Payer: Self-pay | Admitting: Podiatry

## 2019-10-13 DIAGNOSIS — D509 Iron deficiency anemia, unspecified: Secondary | ICD-10-CM | POA: Diagnosis not present

## 2019-10-13 DIAGNOSIS — N186 End stage renal disease: Secondary | ICD-10-CM | POA: Diagnosis not present

## 2019-10-13 DIAGNOSIS — Z992 Dependence on renal dialysis: Secondary | ICD-10-CM | POA: Diagnosis not present

## 2019-10-13 DIAGNOSIS — N2581 Secondary hyperparathyroidism of renal origin: Secondary | ICD-10-CM | POA: Diagnosis not present

## 2019-10-13 NOTE — Progress Notes (Signed)
Subjective:  Patient ID: [name], female    DOB: 09-Jan-1946,  MRN: [id number]  Chief Complaint  Patient presents with  . Foot Pain    pt is here for wound in the right foot, pt states that the foot is elevated to the touch.    74 y.o. female presents for wound care.  Patient presents with right plantar midfoot superficial granular wound.  Patient is well-known to [name] who is been managing the wound.  She has been keeping it bandaged she has been using a callus pad which has been helping a little bit.  Patient states that there is pain with ambulation.  She denies any other acute complaints.  She denies any nausea fever chills vomiting.  She states that she is scheduled to get a break orthotics next week.   Review of Systems: Negative except as noted in the HPI. Denies N/V/F/Ch.  Past Medical History:  Diagnosis Date  . Allergy   . Arthritis    "maybe in my lower back" (08/27/2012)  . 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 Chronic combined systolic and diastolic CHF (congestive heart failure) ([place])   . CKD (chronic kidney disease), stage IV ([place])   . Coronary artery disease    a. NSTEMI in 05/2009 s/p CABG (LIMA-LAD, SVG-diagonal, SVG-OM1/OM 2).   Marland Kitchen Dyspnea    with exertion  . Family history of anesthesia complication    Mom has a hard time to wake up  . Foot ulcer ([place])    "I've had them on both feet" (08/27/2012)  . Gouty arthritis    "right index finger" (08/27/2012)  . History of radiation therapy 06/09/13-07/06/13   left breast 50Gy  . Hyperlipidemia   . Hypertension   . Infection    right second toe  . Ischemic cardiomyopathy    a. 2011: EF 40-45%. b. EF 55-60% in 04/2017 but shortly after as inpatient was 45-50%.  Marland Kitchen LBBB (left bundle branch block)   . Mitral regurgitation   . Neuropathy    Hx; of B/L feet  . NSTEMI (non-ST elevated myocardial infarction) ([place]) 05/23/2009  . Osteomyelitis of foot ([place])   .  PAD (peripheral artery disease) (HCC)    a. LE PAD (patient previously elected hold off L fem-pop), prev followed by Dr. Bridgett Larsson  . PONV (postoperative nausea and vomiting)   . Sinus headache    "occasionally" (08/27/2012)  . Type II diabetes mellitus ([place])   . Vitamin D deficiency     Current Outpatient Medications:  .  allopurinol (ZYLOPRIM) 100 MG tablet, Take 1 tablet 2 x /day to Prevent Gout (Patient taking differently: Take 100 mg by mouth 2 (two) times daily. ), Disp: 180 tablet, Rfl: 3 .  ARTIFICIAL TEAR SOLUTION OP, Place 1 drop into both eyes in the morning and at bedtime., Disp: , Rfl:  .  aspirin EC 81 MG tablet, Take 1 tablet (81 mg total) by mouth daily., Disp: , Rfl:  .  aspirin-acetaminophen-caffeine (EXCEDRIN EXTRA STRENGTH) 250-250-65 MG tablet, Take 1 tablet by mouth 2 (two) times daily as needed (pain)., Disp: , Rfl:  .  atorvastatin (LIPITOR) 10 MG tablet, Take 1 tablet daily for cholesterol., Disp: 90 tablet, Rfl: 1 .  b complex-vitamin c-folic acid (NEPHRO-VITE) 0.8 MG TABS tablet, Take 1 tablet by mouth daily. , Disp: , Rfl:  .  Bisacodyl (LAXATIVE PO), Take 1 capsule by mouth 3 (three) times daily., Disp: , Rfl:  .  cinacalcet (SENSIPAR) 30 MG tablet, Take 1 tablet (30 mg total) by mouth daily with supper., Disp: 60 tablet, Rfl: 1 .  cyclobenzaprine (FLEXERIL) 5 MG tablet, Take 1 tablet (5 mg total) by mouth 3 (three) times daily as needed for muscle spasms., Disp: 60 tablet, Rfl: 1 .  Darbepoetin Alfa (ARANESP) 100 MCG/0.5ML SOSY injection, Inject 0.5 mLs (100 mcg total) into the vein every Tuesday with hemodialysis., Disp: 4.2 mL, Rfl:  .  dextrose 50 % solution, Dextrose 50%, Disp: , Rfl:  .  docusate sodium (COLACE) 100 MG capsule, Take 200 mg by mouth 2 (two) times daily. , Disp: , Rfl:  .  doxercalciferol (HECTOROL) 4 MCG/2ML injection, Inject 2 mLs (4 mcg total) into the vein Every Tuesday,Thursday,and Saturday with dialysis., Disp: 2 mL, Rfl:  .  ferric gluconate  125 mg in sodium chloride 0.9 % 100 mL, Inject 125 mg into the vein Every Tuesday,Thursday,and Saturday with dialysis., Disp:  , Rfl:  .  gabapentin (NEURONTIN) 100 MG capsule, Take 1 capsule (100 mg total) by mouth 2 (two) times daily. (Patient taking differently: Take 100 mg by mouth as needed. ), Disp: 60 capsule, Rfl: 1 .  Homeopathic Products [facility] RELIEF EX), Apply 1 application topically daily as needed (pain)., Disp: , Rfl:  .  hydrALAZINE (APRESOLINE) 25 MG tablet, Take 3 tablets (75 mg total) by mouth every 8 (eight) hours., Disp: 270 tablet, Rfl: 1 .  HYDROcodone-acetaminophen (NORCO) 5-325 MG tablet, Take 1 tablet by mouth every 6 (six) hours as needed for moderate pain., Disp: 30 tablet, Rfl: 0 .  insulin NPH-regular Human (70-30) 100 UNIT/ML injection, Inject 10-30 Units into the skin 2 (two) times daily with a meal. , Disp: , Rfl:  .  Insulin Syringe-Needle U-100 (SAFETY INSULIN SYRINGES) 30G X 1/2" 1 ML MISC, Use to inject insulin twice daily-DX-E11.9 AND Z79.4, Disp: 100 each, Rfl: 3 .  isosorbide dinitrate (ISORDIL) 10 MG tablet, TAKE 1 TABLET THREE TIMES A DAY FOR BLOOD PRESSURE AND HEART, Disp: 270 tablet, Rfl: 3 .  Methoxy PEG-Epoetin Beta (MIRCERA IJ), Mircera, Disp: , Rfl:  .  Multiple Vitamins-Minerals (PRESERVISION AREDS 2 PO), Take 2 capsules by mouth daily at 12 noon. , Disp: , Rfl:  .  pregabalin (LYRICA) 25 MG capsule, Take 1 to 2 capsules /day if needed for Sciatica pain, Disp: 60 capsule, Rfl: 0 .  Probiotic Product ([place]) CAPS, Take 1 capsule by mouth in the morning and at bedtime., Disp: , Rfl:  .  sevelamer carbonate (RENVELA) 800 MG tablet, Take 2 tablets (1,600 mg total) by mouth 3 (three) times daily with meals. (Patient taking differently: Take 1,600 mg by mouth See admin instructions. Take 1600 mg with each meal and each snack), Disp: 90 tablet, Rfl: 1 .  traZODone (DESYREL) 50 MG tablet, Take 50 mg by mouth at bedtime as needed for sleep.,  Disp: , Rfl:   Social History   Tobacco Use  Smoking Status Never Smoker  Smokeless Tobacco Never Used    Allergies  Allergen Reactions  . Atenolol Rash and Other (See Comments)    Exacerbates gout   . Adhesive [Tape] Other (See Comments)    SKIN IS VERY SENSITIVE AND BRUISES AND TEARS EASILY; PLEASE USE AN ALTERNATIVE TO TAPE!!  . Contrast Media [Iodinated Diagnostic Agents] Rash  . Iohexol Rash  . Latex Rash   Objective:  There were no vitals filed for this visit. There is no height or weight on file to  calculate BMI. Constitutional Well developed. Well nourished.  Vascular Dorsalis pedis pulses palpable bilaterally. Posterior tibial pulses palpable bilaterally. Capillary refill normal to all digits.  No cyanosis or clubbing noted. Pedal hair growth normal.  Neurologic Normal speech. Oriented to person, place, and time. Protective sensation absent  Dermatologic Wound Location: Right midfoot superficial ulceration limited to the breakdown of the skin Wound Base: Granular/Healthy Peri-wound: Macerated Exudate: Scant/small amount Serosanguinous exudate Wound Measurements: -See below  Orthopedic: No pain to palpation either foot.   Radiographs: None Assessment:   1. Type II diabetes mellitus with neurological manifestations ([place])   2. Right midfoot ulcer, limited to breakdown of skin [facility])    Plan:  Patient was evaluated and treated and all questions answered.  Ulcer right superficial granular midfoot ulceration limited to the breakdown of the skin with maceration -Debridement as below. -Dressed with Betadine wet-to-dry, DSD. -Continue off-loading with surgical shoe. -Patient will follow back up with [name] in 2 weeks -She is scheduled to get orthotics with [name] next week.  Procedure: Excisional Debridement of Wound Tool: Sharp chisel blade/tissue nipper Rationale: Removal of non-viable soft tissue from the wound to promote healing.  Anesthesia:  none Pre-Debridement Wound Measurements: 0.5 cm x 0.4 cm x 0.1 cm  Post-Debridement Wound Measurements: 0.6 cm x 0.5 cm x 0.1 cm  Type of Debridement: Sharp Excisional Tissue Removed: Non-viable soft tissue Blood loss: Minimal (<50cc) Depth of Debridement: subcutaneous tissue. Technique: Sharp excisional debridement to bleeding, viable wound base.  Wound Progress: The wound may have regressed a little bit since last visit as this is now an actual wound as opposed to preulcerative lesion Dressing: Dry, sterile, compression dressing. Disposition: Patient tolerated procedure well. Patient to return in 1 week for follow-up.  No follow-ups on file.

## 2019-10-15 DIAGNOSIS — N186 End stage renal disease: Secondary | ICD-10-CM | POA: Diagnosis not present

## 2019-10-15 DIAGNOSIS — N2581 Secondary hyperparathyroidism of renal origin: Secondary | ICD-10-CM | POA: Diagnosis not present

## 2019-10-15 DIAGNOSIS — Z992 Dependence on renal dialysis: Secondary | ICD-10-CM | POA: Diagnosis not present

## 2019-10-15 DIAGNOSIS — D509 Iron deficiency anemia, unspecified: Secondary | ICD-10-CM | POA: Diagnosis not present

## 2019-10-17 ENCOUNTER — Ambulatory Visit: Payer: Medicare Other | Admitting: Orthotics

## 2019-10-17 ENCOUNTER — Encounter: Payer: Medicare Other | Admitting: Internal Medicine

## 2019-10-17 ENCOUNTER — Other Ambulatory Visit: Payer: Self-pay

## 2019-10-17 DIAGNOSIS — E1149 Type 2 diabetes mellitus with other diabetic neurological complication: Secondary | ICD-10-CM

## 2019-10-17 DIAGNOSIS — L97411 Non-pressure chronic ulcer of right heel and midfoot limited to breakdown of skin: Secondary | ICD-10-CM

## 2019-10-17 NOTE — Progress Notes (Signed)
Foams lost; rescanned for f/o (DBS three pair); Docs only OK in safestep.

## 2019-10-18 DIAGNOSIS — D509 Iron deficiency anemia, unspecified: Secondary | ICD-10-CM | POA: Diagnosis not present

## 2019-10-18 DIAGNOSIS — N2581 Secondary hyperparathyroidism of renal origin: Secondary | ICD-10-CM | POA: Diagnosis not present

## 2019-10-18 DIAGNOSIS — N186 End stage renal disease: Secondary | ICD-10-CM | POA: Diagnosis not present

## 2019-10-18 DIAGNOSIS — Z992 Dependence on renal dialysis: Secondary | ICD-10-CM | POA: Diagnosis not present

## 2019-10-20 DIAGNOSIS — Z992 Dependence on renal dialysis: Secondary | ICD-10-CM | POA: Diagnosis not present

## 2019-10-20 DIAGNOSIS — N2581 Secondary hyperparathyroidism of renal origin: Secondary | ICD-10-CM | POA: Diagnosis not present

## 2019-10-20 DIAGNOSIS — D509 Iron deficiency anemia, unspecified: Secondary | ICD-10-CM | POA: Diagnosis not present

## 2019-10-20 DIAGNOSIS — N186 End stage renal disease: Secondary | ICD-10-CM | POA: Diagnosis not present

## 2019-10-20 NOTE — Progress Notes (Signed)
CARDIOLOGY OFFICE NOTE  Date:  10/28/2019    [name] Date of Birth: 1946/01/07 Medical Record #[id number]  PCP:  Unk Pinto, MD  Cardiologist:  Servando Snare & formerly Johnsie Cancel   No chief complaint on file.   History of Present Illness: 74 y.o. f/u CAD CABG 2011 with Dr Cyndia Bent Also has PVD post left femoral to popliteal bypass with  Stage 4 kidney disease on dialysis She has a mutre right radial graft now She has HTN, HLD and DM.  ECG shows chronic LBBB  TTE 11/27/17 showed normal EF ? Mobile chord/fibroelastoma LV side and moderate MR   Seeing wound center for right foot ulcer Her last ABI's in 08/01/19 were non compressible She follow with Dr Donnetta Hutching for her PVD and Fistual Access From her angio December 2020 she has occluded right SFA with single vessel anterior tibial run-off   She is also having issues with her right forearm fistula enlarging Seems as though she has had PCI a couple of Times for "scar tissue"  Seeing different vascular doctor for this and not [facility]   No angina or cardiac symptoms    Past Medical History:  Diagnosis Date  . Allergy   . Arthritis    "maybe in my lower back" (08/27/2012)  . 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 Chronic combined systolic and diastolic CHF (congestive heart failure) ([place])   . CKD (chronic kidney disease), stage IV ([place])   . Coronary artery disease    a. NSTEMI in 05/2009 s/p CABG (LIMA-LAD, SVG-diagonal, SVG-OM1/OM 2).   Marland Kitchen Dyspnea    with exertion  . Family history of anesthesia complication    Mom has a hard time to wake up  . Foot ulcer ([place])    "I've had them on both feet" (08/27/2012)  . Gouty arthritis    "right index finger" (08/27/2012)  . History of radiation therapy 06/09/13-07/06/13   left breast 50Gy  . Hyperlipidemia   . Hypertension   . Infection    right second toe  . Ischemic cardiomyopathy    a. 2011: EF 40-45%. b. EF 55-60% in 04/2017 but shortly  after as inpatient was 45-50%.  Marland Kitchen LBBB (left bundle branch block)   . Mitral regurgitation   . Neuropathy    Hx; of B/L feet  . NSTEMI (non-ST elevated myocardial infarction) ([place]) 05/23/2009  . Osteomyelitis of foot ([place])   . PAD (peripheral artery disease) ([facility])    a. LE PAD (patient previously elected hold off L fem-pop), prev followed by Dr. Bridgett Larsson  . PONV (postoperative nausea and vomiting)   . Sinus headache    "occasionally" (08/27/2012)  . Type II diabetes mellitus ([place])   . Vitamin D deficiency     Past Surgical History:  Procedure Laterality Date  . ABDOMINAL AORTOGRAM W/LOWER EXTREMITY Bilateral 03/25/2019   Procedure: ABDOMINAL AORTOGRAM W/LOWER EXTREMITY;  Surgeon: Marty Heck, MD;  Location: [facility];  Service: Cardiovascular;  Laterality: Bilateral;  . AMPUTATION Right 08/27/2012   Procedure: AMPUTATION RAY;  Surgeon: Newt Minion, MD;  Location: [facility];  Service: Orthopedics;  Laterality: Right;  Right Foot 5th Ray Amputation  . AMPUTATION Left 07/08/2013   Procedure: AMPUTATION RAY;  Surgeon: Newt Minion, MD;  Location: [facility];  Service: Orthopedics;  Laterality: Left;  Left Foot 2nd Ray Amputation  . AMPUTATION Left 04/06/2019   Procedure: LEFT 5TH RAY AMPUTATION;  Surgeon: Meridee Score  V, MD;  Location: [facility];  Service: Orthopedics;  Laterality: Left;  . AMPUTATION RAY Right 08/27/2012   5th ray/notes 08/27/2012  . AMPUTATION TOE Right 03/06/2017   Procedure: AMPUTATION TOE, INTERPHANGEAL 2ND RIGHT;  Surgeon: [name], DPM;  Location: [facility];  Service: Podiatry;  Laterality: Right;  . AV FISTULA PLACEMENT Right 11/11/2017   Procedure: RIGHT RADIOCEPHALIC  ARTERIOVENOUS FISTULA;  Surgeon: [name], MD;  Location: [facility];  Service: Vascular;  Laterality: Right;  . BREAST LUMPECTOMY  04/20/2013   with biopsy      DR WAKEFIELD  . BREAST LUMPECTOMY WITH NEEDLE LOCALIZATION AND AXILLARY SENTINEL LYMPH NODE BX Left 04/20/2013   Procedure: LEFT  BREAST WIRE GUIDED LUMPECTOMY AND AXILLARY SENTINEL LYMPH NODE BX;  Surgeon: Rolm Bookbinder, MD;  Location: [facility];  Service: General;  Laterality: Left;  . CARDIAC CATHETERIZATION  05/24/2009   Archie Endo 05/24/2009 (08/27/2012)  . CATARACT EXTRACTION W/ INTRAOCULAR LENS  IMPLANT, BILATERAL  2000's  . COLONOSCOPY W/ BIOPSIES AND POLYPECTOMY  2010  . CORONARY ARTERY BYPASS GRAFT  2011   "CABG X4" (08/27/2012)  . DENTAL SURGERY  04/30/11   "1 implant" (08/27/2012)  . DILATION AND CURETTAGE OF UTERUS    . ENDARTERECTOMY FEMORAL Left 03/28/2019   Procedure: ENDARTERECTOMY FEMORAL;  Surgeon: [name], MD;  Location: [facility];  Service: Vascular;  Laterality: Left;  . EYE SURGERY    . FEMORAL-POPLITEAL BYPASS GRAFT Left 03/28/2019   Procedure: BYPASS GRAFT FEMORAL-POPLITEAL ARTERY;  Surgeon: [name], MD;  Location: [facility];  Service: Vascular;  Laterality: Left;  . FINGER SURGERY Right    "index finger; turned out to be gout" (08/27/2012)  . IR FLUORO GUIDE CV LINE RIGHT  11/30/2017  . PATCH ANGIOPLASTY Left 03/28/2019   Procedure: Patch Angioplasty;  Surgeon: [name], MD;  Location: [facility];  Service: Vascular;  Laterality: Left;  . SHOULDER CLOSED REDUCTION Left 05/27/2019   Procedure: CLOSED REDUCTION LEFT SHOULDER;  Surgeon: Mcarthur Rossetti, MD;  Location: [facility];  Service: Orthopedics;  Laterality: Left;  . TEE WITHOUT CARDIOVERSION N/A 12/01/2017   Procedure: TRANSESOPHAGEAL ECHOCARDIOGRAM (TEE);  Surgeon: Lelon Perla, MD;  Location[facility];  Service: Cardiovascular;  Laterality: N/A;     Medications: Current Meds  Medication Sig  . allopurinol (ZYLOPRIM) 100 MG tablet Take 1 tablet 2 x /day to Prevent Gout  . ARTIFICIAL TEAR SOLUTION OP Place 1 drop into both eyes in the morning and at bedtime.  Marland Kitchen aspirin EC 81 MG tablet Take 81 mg by mouth as needed. Alternates with Excedrin  . aspirin-acetaminophen-caffeine (EXCEDRIN EXTRA STRENGTH) 250-250-65 MG tablet Take 1  tablet by mouth 2 (two) times daily as needed (pain).  Marland Kitchen atorvastatin (LIPITOR) 10 MG tablet Take 1 tablet daily for cholesterol.  Marland Kitchen b complex-vitamin c-folic acid (NEPHRO-VITE) 0.8 MG TABS tablet Take 1 tablet by mouth daily.   . Bisacodyl (LAXATIVE PO) Take 1 capsule by mouth 3 (three) times daily.  . cinacalcet (SENSIPAR) 30 MG tablet Take 1 tablet (30 mg total) by mouth daily with supper.  . cyclobenzaprine (FLEXERIL) 5 MG tablet Take 1 tablet (5 mg total) by mouth 3 (three) times daily as needed for muscle spasms.  . Darbepoetin Alfa (ARANESP) 100 MCG/0.5ML SOSY injection Inject 0.5 mLs (100 mcg total) into the vein every Tuesday with hemodialysis.  Marland Kitchen dextrose 50 % solution Dextrose 50%  . docusate sodium (COLACE) 100 MG capsule Take 200 mg by mouth 2 (  two) times daily.   Marland Kitchen doxercalciferol (HECTOROL) 4 MCG/2ML injection Inject 2 mLs (4 mcg total) into the vein Every Tuesday,Thursday,and Saturday with dialysis.  . ferric gluconate 125 mg in sodium chloride 0.9 % 100 mL Inject 125 mg into the vein Every Tuesday,Thursday,and Saturday with dialysis.  . Homeopathic Products (THERAWORX RELIEF EX) Apply 1 application topically daily as needed (pain).  . hydrALAZINE (APRESOLINE) 25 MG tablet Take 3 tablets (75 mg total) by mouth every 8 (eight) hours.  . insulin NPH-regular Human (70-30) 100 UNIT/ML injection Inject 10-30 Units into the skin 2 (two) times daily with a meal.   . Insulin Syringe-Needle U-100 (SAFETY INSULIN SYRINGES) 30G X 1/2" 1 ML MISC Use to inject insulin twice daily-DX-E11.9 AND Z79.4  . isosorbide dinitrate (ISORDIL) 10 MG tablet TAKE 1 TABLET THREE TIMES A DAY FOR BLOOD PRESSURE AND HEART  . Methoxy PEG-Epoetin Beta (MIRCERA IJ) Mircera  . Multiple Vitamins-Minerals (PRESERVISION AREDS 2 PO) Take 2 capsules by mouth daily at 12 noon.   . pregabalin (LYRICA) 25 MG capsule Take 1 to 2 capsules /day if needed for Sciatica pain  . Probiotic Product ([place]) CAPS Take  1 capsule by mouth in the morning and at bedtime.  . sevelamer carbonate (RENVELA) 800 MG tablet Take 2 tablets (1,600 mg total) by mouth 3 (three) times daily with meals.  . traZODone (DESYREL) 50 MG tablet Take 50 mg by mouth at bedtime as needed for sleep.     Allergies: Allergies  Allergen Reactions  . Atenolol Rash and Other (See Comments)    Exacerbates gout   . Adhesive [Tape] Other (See Comments)    SKIN IS VERY SENSITIVE AND BRUISES AND TEARS EASILY; PLEASE USE AN ALTERNATIVE TO TAPE!!  . Contrast Media [Iodinated Diagnostic Agents] Rash  . Iohexol Rash  . Latex Rash    Social History: The patient  reports that she has never smoked. She has never used smokeless tobacco. She reports previous alcohol use. She reports that she does not use drugs.   Family History: The patient's family history includes Breast cancer in her maternal aunt; Breast cancer (age of onset: 61) in her sister; Breast cancer (age of onset: 60) in her sister; Hypertension in her brother; Kidney cancer (age of onset: 69) in her brother; Throat cancer (age of onset: 34) in her father.   Review of Systems: Please see the history of present illness.   Otherwise, the review of systems is positive for none.   All other systems are reviewed and negative.   Physical Exam: VS:  BP (!) 98/56   Pulse 86   Ht 5\' 4"  (1.626 m)   Wt 157 lb (71.2 kg)   SpO2 96%   BMI 26.95 kg/m  .  BMI Body mass index is 26.95 kg/m.  Wt Readings from Last 3 Encounters:  10/28/19 157 lb (71.2 kg)  10/03/19 156 lb (70.8 kg)  08/01/19 156 lb 8 oz (71 kg)   Affect appropriate Chronically ill white female  HEENT: right IJ dialysis catheter  Neck supple with no adenopathy JVP normal left  bruits no thyromegaly Lungs clear with no wheezing and good diaphragmatic motion Heart:  S1/S2 MR murmur, no rub, gallop or click PMI normal Abdomen: benighn, BS positve, Post left fem-pop surgery  Decreased peripheral pulses worse on left    Amputation right foot 4 th/ 5 th digits  Neuro non-focal Skin warm and dry Right fifth toe amputation  Large right radial fistula with  thrill  Diminished pedal pulses bilaterally     LABORATORY DATA:  EKG:   11/29/17 SR LBBB chronic no acute changes   Lab Results  Component Value Date   WBC 7.5 10/03/2019   HGB 11.7 10/03/2019   HCT 36.0 10/03/2019   PLT 224 10/03/2019   GLUCOSE 88 10/03/2019   CHOL 99 10/03/2019   TRIG 79 10/03/2019   HDL 48 (L) 10/03/2019   LDLCALC 35 10/03/2019   ALT 18 10/03/2019   AST 19 10/03/2019   NA 140 10/03/2019   K 4.7 10/03/2019   CL 93 (L) 10/03/2019   CREATININE 3.91 (H) 10/03/2019   BUN 42 (H) 10/03/2019   CO2 35 (H) 10/03/2019   TSH 1.88 10/03/2019   INR 1.1 03/28/2019   HGBA1C 5.3 10/03/2019   MICROALBUR 118.3 08/04/2017     Other Studies Reviewed Today:  Myoview Study Highlights 05/2017    Nuclear stress EF: 44%.  There was no ST segment deviation noted during stress.  The study is normal.  This is a low risk study.  The left ventricular ejection fraction is mildly decreased (45-54%).   Normal pharmacologic nuclear stress test with no evidence for prior infarct or ischemia.  Mildly decreased LVEF calculated at 44%.      2D Echo 05/04/17 Study Conclusions  - Left ventricle: The cavity size was normal. Wall thickness was increased in a pattern of mild LVH. Systolic function was mildly reduced. The estimated ejection fraction was in the range of 45% to 50%. Diffuse hypokinesis. Features are consistent with a pseudonormal left ventricular filling pattern, with concomitant abnormal relaxation and increased filling pressure (grade 2 diastolic dysfunction). - Aortic valve: Poorly visualized. Probably trileaflet; mildly calcified leaflets. There was no stenosis. - Mitral valve: Severely calcified annulus. Moderately calcified leaflets . The findings are consistent with trivial stenosis. There  was moderate regurgitation. Pressure half-time: 77 ms. Mean gradient (D): 6 mm Hg. - Left atrium: The atrium was mildly dilated. - Right ventricle: The cavity size was normal. Systolic function was normal. - Pulmonary arteries: PA peak pressure: 32 mm Hg (S). - Inferior vena cava: The vessel was normal in size. The respirophasic diameter changes were in the normal range (>= 50%), consistent with normal central venous pressure.  Impressions:  - Normal LV size with mild LV hypertrophy. EF 45-50%, diffuse hypokinesis. Normal RV size and systolic function. Heavily calcified mitral valve and annulus. There was moderate mitral regurgitation and probably only trivial mitral stenosis (mean gradient 6 mmHg but PHT 77 msec suggesting valve area in normal range).   Assessment/Plan:  1.Chronic combined systolic and diastolic CHF - EF 23-53% TEE August 2019 improved with dialysis Will update echo   2. CAD with remote MI/CABG - multiple risk factors - her Myovew from2/14/19  was felt to be low risk.  No angina can consider cath in future now that she is being dialyzed   3. HLD - on high dose statin   4. Moderate MR on TEE August 2019 no symptoms update echo   5. HTN - better control noted at home. For now, no change in her regimen.   6. CKD - f/u Lorrene Reid more stable on hemodialysis with matured right radial graft now functional   7. PVD:  Needs f/u with Dr Donnetta Hutching post left fem-pop bypass, post fistula PCI and non healing ulcer right foot with known right SFA occlusion and single vessel run off Continue wound center care  Also had left carotid bruit will order  duplex  Personally spoke with Dr Early earlier in week and his office has called patient to get her seen and followed closer for her PVD   Echo for history of systolic CHF and moderate MR Carotid duplex for left bruit   Jenkins Rouge

## 2019-10-22 DIAGNOSIS — Z992 Dependence on renal dialysis: Secondary | ICD-10-CM | POA: Diagnosis not present

## 2019-10-22 DIAGNOSIS — N2581 Secondary hyperparathyroidism of renal origin: Secondary | ICD-10-CM | POA: Diagnosis not present

## 2019-10-22 DIAGNOSIS — N186 End stage renal disease: Secondary | ICD-10-CM | POA: Diagnosis not present

## 2019-10-22 DIAGNOSIS — D509 Iron deficiency anemia, unspecified: Secondary | ICD-10-CM | POA: Diagnosis not present

## 2019-10-25 DIAGNOSIS — N186 End stage renal disease: Secondary | ICD-10-CM | POA: Diagnosis not present

## 2019-10-25 DIAGNOSIS — D509 Iron deficiency anemia, unspecified: Secondary | ICD-10-CM | POA: Diagnosis not present

## 2019-10-25 DIAGNOSIS — Z992 Dependence on renal dialysis: Secondary | ICD-10-CM | POA: Diagnosis not present

## 2019-10-25 DIAGNOSIS — N2581 Secondary hyperparathyroidism of renal origin: Secondary | ICD-10-CM | POA: Diagnosis not present

## 2019-10-27 DIAGNOSIS — N2581 Secondary hyperparathyroidism of renal origin: Secondary | ICD-10-CM | POA: Diagnosis not present

## 2019-10-27 DIAGNOSIS — D509 Iron deficiency anemia, unspecified: Secondary | ICD-10-CM | POA: Diagnosis not present

## 2019-10-27 DIAGNOSIS — N186 End stage renal disease: Secondary | ICD-10-CM | POA: Diagnosis not present

## 2019-10-27 DIAGNOSIS — Z992 Dependence on renal dialysis: Secondary | ICD-10-CM | POA: Diagnosis not present

## 2019-10-27 DIAGNOSIS — E119 Type 2 diabetes mellitus without complications: Secondary | ICD-10-CM | POA: Diagnosis not present

## 2019-10-28 ENCOUNTER — Other Ambulatory Visit: Payer: Self-pay

## 2019-10-28 ENCOUNTER — Other Ambulatory Visit: Payer: Self-pay | Admitting: Internal Medicine

## 2019-10-28 ENCOUNTER — Encounter: Payer: Self-pay | Admitting: Cardiovascular Disease

## 2019-10-28 ENCOUNTER — Ambulatory Visit (INDEPENDENT_AMBULATORY_CARE_PROVIDER_SITE_OTHER): Payer: Medicare Other | Admitting: Cardiovascular Disease

## 2019-10-28 ENCOUNTER — Telehealth: Payer: Self-pay | Admitting: Orthopaedic Surgery

## 2019-10-28 VITALS — BP 98/56 | HR 86 | Ht 64.0 in | Wt 157.0 lb

## 2019-10-28 DIAGNOSIS — I739 Peripheral vascular disease, unspecified: Secondary | ICD-10-CM | POA: Diagnosis not present

## 2019-10-28 DIAGNOSIS — I5040 Unspecified combined systolic (congestive) and diastolic (congestive) heart failure: Secondary | ICD-10-CM | POA: Diagnosis not present

## 2019-10-28 DIAGNOSIS — I25810 Atherosclerosis of coronary artery bypass graft(s) without angina pectoris: Secondary | ICD-10-CM | POA: Diagnosis not present

## 2019-10-28 DIAGNOSIS — I340 Nonrheumatic mitral (valve) insufficiency: Secondary | ICD-10-CM | POA: Diagnosis not present

## 2019-10-28 DIAGNOSIS — R0989 Other specified symptoms and signs involving the circulatory and respiratory systems: Secondary | ICD-10-CM

## 2019-10-28 DIAGNOSIS — M109 Gout, unspecified: Secondary | ICD-10-CM

## 2019-10-28 NOTE — Telephone Encounter (Signed)
Patient hasnt been seen by Dr. Ninfa Linden since mid March at which time he told her per his notes he would not fill prescriptions long term. If she is having increasing pain or new symptoms she needs to make an appointment with Dr. Ninfa Linden or Artis Delay

## 2019-10-28 NOTE — Telephone Encounter (Signed)
Can you advise? 

## 2019-10-28 NOTE — Telephone Encounter (Signed)
Appt scheduled

## 2019-10-28 NOTE — Telephone Encounter (Signed)
Patient called. She would like a refill on hydrocodone. Her call back number is [phone]

## 2019-10-28 NOTE — Patient Instructions (Signed)
Medication Instructions:  *If you need a refill on your cardiac medications before your next appointment, please call your pharmacy*  Lab Work: If you have labs (blood work) drawn today and your tests are completely normal, you will receive your results only by: Marland Kitchen MyChart Message (if you have MyChart) OR . A paper copy in the mail If you have any lab test that is abnormal or we need to change your treatment, we will call you to review the results.  Testing/Procedures: Your physician has requested that you have a carotid duplex. This test is an ultrasound of the carotid arteries in your neck. It looks at blood flow through these arteries that supply the brain with blood. Allow one hour for this exam. There are no restrictions or special instructions.  Follow-Up: At [facility], you and your health needs are our priority.  As part of our continuing mission to provide you with exceptional heart care, we have created designated Provider Care Teams.  These Care Teams include your primary Cardiologist (physician) and Advanced Practice Providers (APPs -  Physician Assistants and Nurse Practitioners) who all work together to provide you with the care you need, when you need it.  We recommend signing up for the patient portal called "MyChart".  Sign up information is provided on this After Visit Summary.  MyChart is used to connect with patients for Virtual Visits (Telemedicine).  Patients are able to view lab/test results, encounter notes, upcoming appointments, etc.  Non-urgent messages can be sent to your provider as well.   To learn more about what you can do with MyChart, go to [email or web address].    Your next appointment:   6 month(s)  The format for your next appointment:   In Person  Provider:   You may see Jenkins Rouge, MD or one of the following Advanced Practice Providers on your designated Care Team:    Truitt Merle, NP  Cecilie Kicks, NP  Kathyrn Drown, NP

## 2019-10-29 DIAGNOSIS — Z992 Dependence on renal dialysis: Secondary | ICD-10-CM | POA: Diagnosis not present

## 2019-10-29 DIAGNOSIS — N186 End stage renal disease: Secondary | ICD-10-CM | POA: Diagnosis not present

## 2019-10-29 DIAGNOSIS — N2581 Secondary hyperparathyroidism of renal origin: Secondary | ICD-10-CM | POA: Diagnosis not present

## 2019-10-29 DIAGNOSIS — D509 Iron deficiency anemia, unspecified: Secondary | ICD-10-CM | POA: Diagnosis not present

## 2019-10-31 ENCOUNTER — Ambulatory Visit (INDEPENDENT_AMBULATORY_CARE_PROVIDER_SITE_OTHER): Payer: Medicare Other | Admitting: Podiatry

## 2019-10-31 ENCOUNTER — Other Ambulatory Visit: Payer: Self-pay

## 2019-10-31 ENCOUNTER — Inpatient Hospital Stay (HOSPITAL_COMMUNITY): Admission: RE | Admit: 2019-10-31 | Payer: Medicare Other | Source: Ambulatory Visit

## 2019-10-31 ENCOUNTER — Ambulatory Visit: Payer: Medicare Other | Admitting: Podiatry

## 2019-10-31 ENCOUNTER — Ambulatory Visit: Payer: Medicare Other

## 2019-10-31 DIAGNOSIS — E1149 Type 2 diabetes mellitus with other diabetic neurological complication: Secondary | ICD-10-CM

## 2019-10-31 DIAGNOSIS — L97411 Non-pressure chronic ulcer of right heel and midfoot limited to breakdown of skin: Secondary | ICD-10-CM | POA: Diagnosis not present

## 2019-11-01 ENCOUNTER — Other Ambulatory Visit: Payer: Self-pay

## 2019-11-01 DIAGNOSIS — I739 Peripheral vascular disease, unspecified: Secondary | ICD-10-CM

## 2019-11-01 DIAGNOSIS — N186 End stage renal disease: Secondary | ICD-10-CM | POA: Diagnosis not present

## 2019-11-01 DIAGNOSIS — D509 Iron deficiency anemia, unspecified: Secondary | ICD-10-CM | POA: Diagnosis not present

## 2019-11-01 DIAGNOSIS — Z992 Dependence on renal dialysis: Secondary | ICD-10-CM | POA: Diagnosis not present

## 2019-11-01 DIAGNOSIS — N2581 Secondary hyperparathyroidism of renal origin: Secondary | ICD-10-CM | POA: Diagnosis not present

## 2019-11-01 NOTE — Progress Notes (Signed)
Subjective: 74 year old female presents the office today for follow-up evaluation of an ulcer to the right midfoot and also for possible nail debridement.  She states that the wound is doing well for which she can tell that she does not change the bandage regularly as she has difficulty reaching the area.  She does get some pain to the feet bilaterally but she states this is more along the "pressure points" from prior amputations but not to the wound.  She denies any increase in swelling or redness of the foot. Denies any systemic complaints such as fevers, chills, nausea, vomiting. No acute changes since last appointment, and no other complaints at this time.   Objective: AAO x3, NAD DP/PT pulses palpable bilaterally, CRT less than 3 seconds On the right midfoot there is a granular wound with hyperkeratotic periwound and some nonviable tissue present within the wound.  After debridement of the wound measures 0.5 x 0.4 x 0.2 cm.  Prior to debridement it measured 0.6 x 0.5 x 0.1 cm.  After debridement there is a granular wound base and prior there is small meta fibrotic tissue present.  Hyperkeratotic periwound.  There is no surrounding erythema, ascending cellulitis.  Is no fluctuation or crepitation.  There is no malodor. No pain with calf compression, swelling, warmth, erythema  Assessment: 74 year old female right foot ulceration  Plan: -All treatment options discussed with the patient including all alternatives, risks, complications.  -I sharply debrided the wound today utilizing the 312 with scalpel to remove nonviable fibrotic, hyperkeratotic tissue to debride the wound down to healthy, granular, bleeding tissue.  There is minimal blood loss in the subcutaneous tissue.  Continue daily dressing changes and offloading at all times.  She is scheduled to see vascular surgery as well. -I debrided the nails with any complications or bleeding as they were slightly elongated.  I did this is a courtesy  today. -Patient encouraged to call the office with any questions, concerns, change in symptoms.   [name] DPM

## 2019-11-03 ENCOUNTER — Telehealth: Payer: Self-pay | Admitting: *Deleted

## 2019-11-03 ENCOUNTER — Other Ambulatory Visit: Payer: Self-pay | Admitting: Internal Medicine

## 2019-11-03 ENCOUNTER — Telehealth: Payer: Self-pay | Admitting: Internal Medicine

## 2019-11-03 DIAGNOSIS — M5430 Sciatica, unspecified side: Secondary | ICD-10-CM

## 2019-11-03 DIAGNOSIS — Z992 Dependence on renal dialysis: Secondary | ICD-10-CM | POA: Diagnosis not present

## 2019-11-03 DIAGNOSIS — N2581 Secondary hyperparathyroidism of renal origin: Secondary | ICD-10-CM | POA: Diagnosis not present

## 2019-11-03 DIAGNOSIS — N186 End stage renal disease: Secondary | ICD-10-CM | POA: Diagnosis not present

## 2019-11-03 DIAGNOSIS — D509 Iron deficiency anemia, unspecified: Secondary | ICD-10-CM | POA: Diagnosis not present

## 2019-11-03 MED ORDER — PREGABALIN 100 MG PO CAPS: ORAL_CAPSULE | ORAL | 0 refills | Status: DC

## 2019-11-03 NOTE — Telephone Encounter (Signed)
Patient called and requested a stronger dose of Pregabalin.  She takes 25 mg on tne day os her dialysis. Dr Melford Aase sent in an RX for 100 mg capsules. A message was left to inform the patient.

## 2019-11-03 NOTE — Telephone Encounter (Signed)
Called [name] @ [facility] [phone], to get fax number.   Patient requests Hemoglobin A1C and pertinant labs be shared via [facility] Epic EMR to avoid costly duplications of labs.  Also faxed last 3 A1C results to [phone]. Mailed patient copy of labs per her request.

## 2019-11-05 DIAGNOSIS — D509 Iron deficiency anemia, unspecified: Secondary | ICD-10-CM | POA: Diagnosis not present

## 2019-11-05 DIAGNOSIS — N2581 Secondary hyperparathyroidism of renal origin: Secondary | ICD-10-CM | POA: Diagnosis not present

## 2019-11-05 DIAGNOSIS — N186 End stage renal disease: Secondary | ICD-10-CM | POA: Diagnosis not present

## 2019-11-05 DIAGNOSIS — Z992 Dependence on renal dialysis: Secondary | ICD-10-CM | POA: Diagnosis not present

## 2019-11-06 DIAGNOSIS — N186 End stage renal disease: Secondary | ICD-10-CM | POA: Diagnosis not present

## 2019-11-06 DIAGNOSIS — E1122 Type 2 diabetes mellitus with diabetic chronic kidney disease: Secondary | ICD-10-CM | POA: Diagnosis not present

## 2019-11-06 DIAGNOSIS — Z992 Dependence on renal dialysis: Secondary | ICD-10-CM | POA: Diagnosis not present

## 2019-11-08 DIAGNOSIS — D509 Iron deficiency anemia, unspecified: Secondary | ICD-10-CM | POA: Diagnosis not present

## 2019-11-08 DIAGNOSIS — N186 End stage renal disease: Secondary | ICD-10-CM | POA: Diagnosis not present

## 2019-11-08 DIAGNOSIS — D631 Anemia in chronic kidney disease: Secondary | ICD-10-CM | POA: Diagnosis not present

## 2019-11-08 DIAGNOSIS — N2581 Secondary hyperparathyroidism of renal origin: Secondary | ICD-10-CM | POA: Diagnosis not present

## 2019-11-08 DIAGNOSIS — Z992 Dependence on renal dialysis: Secondary | ICD-10-CM | POA: Diagnosis not present

## 2019-11-09 ENCOUNTER — Encounter (HOSPITAL_COMMUNITY): Payer: Self-pay

## 2019-11-09 ENCOUNTER — Ambulatory Visit (HOSPITAL_COMMUNITY)
Admission: RE | Admit: 2019-11-09 | Discharge: 2019-11-09 | Disposition: A | Discharge status: Home / Self Care | Payer: Medicare Other | Source: Ambulatory Visit | Attending: Vascular Surgery | Admitting: Vascular Surgery

## 2019-11-09 ENCOUNTER — Telehealth: Payer: Self-pay | Admitting: *Deleted

## 2019-11-09 ENCOUNTER — Other Ambulatory Visit: Payer: Self-pay

## 2019-11-09 ENCOUNTER — Ambulatory Visit: Payer: Medicare Other

## 2019-11-09 DIAGNOSIS — I739 Peripheral vascular disease, unspecified: Secondary | ICD-10-CM

## 2019-11-09 NOTE — Progress Notes (Deleted)
History of Present Illness:  Patient is a 74 y.o. year old female who presents for follow-up of left femoral to popliteal artery bypass with nonreversed translocated great saphenous vein and left femoral artery endarterectomy with Dacron patch angioplasty on March 28, 2019.  This was performed secondary to critical limb ischemia of the left leg with nonhealing foot wounds by Dr. Donnetta Hutching.  She underwent ray amputation of the fifth toe by Dr. Sharol Given on April 06, 2019.  She denise symptoms of claudication, non healing wounds and rest pain. She is able to ambulate with a straight cane.   Past Medical History:  Diagnosis Date  . Allergy   . Arthritis    "maybe in my lower back" (08/27/2012)  . 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 Chronic combined systolic and diastolic CHF (congestive heart failure) ([place])   . CKD (chronic kidney disease), stage IV ([place])   . Coronary artery disease    a. NSTEMI in 05/2009 s/p CABG (LIMA-LAD, SVG-diagonal, SVG-OM1/OM 2).   Marland Kitchen Dyspnea    with exertion  . Family history of anesthesia complication    Mom has a hard time to wake up  . Foot ulcer ([place])    "I've had them on both feet" (08/27/2012)  . Gouty arthritis    "right index finger" (08/27/2012)  . History of radiation therapy 06/09/13-07/06/13   left breast 50Gy  . Hyperlipidemia   . Hypertension   . Infection    right second toe  . Ischemic cardiomyopathy    a. 2011: EF 40-45%. b. EF 55-60% in 04/2017 but shortly after as inpatient was 45-50%.  Marland Kitchen LBBB (left bundle branch block)   . Mitral regurgitation   . Neuropathy    Hx; of B/L feet  . NSTEMI (non-ST elevated myocardial infarction) ([facility]) 05/23/2009  . Osteomyelitis of foot ([place])   . PAD (peripheral artery disease) ([facility])    a. LE PAD (patient previously elected hold off L fem-pop), prev followed by Dr. Bridgett Larsson  . PONV (postoperative nausea and vomiting)   . Sinus headache    "occasionally"  (08/27/2012)  . Type II diabetes mellitus ([place])   . Vitamin D deficiency     Past Surgical History:  Procedure Laterality Date  . ABDOMINAL AORTOGRAM W/LOWER EXTREMITY Bilateral 03/25/2019   Procedure: ABDOMINAL AORTOGRAM W/LOWER EXTREMITY;  Surgeon: Marty Heck, MD;  Location: [facility];  Service: Cardiovascular;  Laterality: Bilateral;  . AMPUTATION Right 08/27/2012   Procedure: AMPUTATION RAY;  Surgeon: Newt Minion, MD;  Location: [facility];  Service: Orthopedics;  Laterality: Right;  Right Foot 5th Ray Amputation  . AMPUTATION Left 07/08/2013   Procedure: AMPUTATION RAY;  Surgeon: Newt Minion, MD;  Location: [facility];  Service: Orthopedics;  Laterality: Left;  Left Foot 2nd Ray Amputation  . AMPUTATION Left 04/06/2019   Procedure: LEFT 5TH RAY AMPUTATION;  Surgeon: Newt Minion, MD;  Location: [facility];  Service: Orthopedics;  Laterality: Left;  . AMPUTATION RAY Right 08/27/2012   5th ray/notes 08/27/2012  . AMPUTATION TOE Right 03/06/2017   Procedure: AMPUTATION TOE, INTERPHANGEAL 2ND RIGHT;  Surgeon: [name], DPM;  Location: [facility];  Service: Podiatry;  Laterality: Right;  . AV FISTULA PLACEMENT Right 11/11/2017   Procedure: RIGHT RADIOCEPHALIC  ARTERIOVENOUS FISTULA;  Surgeon: [name], MD;  Location: [facility];  Service: Vascular;  Laterality: Right;  . BREAST LUMPECTOMY  04/20/2013   with biopsy  DR WAKEFIELD  . BREAST LUMPECTOMY WITH NEEDLE LOCALIZATION AND AXILLARY SENTINEL LYMPH NODE BX Left 04/20/2013   Procedure: LEFT BREAST WIRE GUIDED LUMPECTOMY AND AXILLARY SENTINEL LYMPH NODE BX;  Surgeon: Rolm Bookbinder, MD;  Location: [facility];  Service: General;  Laterality: Left;  . CARDIAC CATHETERIZATION  05/24/2009   Archie Endo 05/24/2009 (08/27/2012)  . CATARACT EXTRACTION W/ INTRAOCULAR LENS  IMPLANT, BILATERAL  2000's  . COLONOSCOPY W/ BIOPSIES AND POLYPECTOMY  2010  . CORONARY ARTERY BYPASS GRAFT  2011   "CABG X4" (08/27/2012)  . DENTAL SURGERY  04/30/11   "1  implant" (08/27/2012)  . DILATION AND CURETTAGE OF UTERUS    . ENDARTERECTOMY FEMORAL Left 03/28/2019   Procedure: ENDARTERECTOMY FEMORAL;  Surgeon: [name], MD;  Location: [facility];  Service: Vascular;  Laterality: Left;  . EYE SURGERY    . FEMORAL-POPLITEAL BYPASS GRAFT Left 03/28/2019   Procedure: BYPASS GRAFT FEMORAL-POPLITEAL ARTERY;  Surgeon: [name], MD;  Location: [facility];  Service: Vascular;  Laterality: Left;  . FINGER SURGERY Right    "index finger; turned out to be gout" (08/27/2012)  . IR FLUORO GUIDE CV LINE RIGHT  11/30/2017  . PATCH ANGIOPLASTY Left 03/28/2019   Procedure: Patch Angioplasty;  Surgeon: [name], MD;  Location: [facility];  Service: Vascular;  Laterality: Left;  . SHOULDER CLOSED REDUCTION Left 05/27/2019   Procedure: CLOSED REDUCTION LEFT SHOULDER;  Surgeon: Mcarthur Rossetti, MD;  Location: [facility];  Service: Orthopedics;  Laterality: Left;  . TEE WITHOUT CARDIOVERSION N/A 12/01/2017   Procedure: TRANSESOPHAGEAL ECHOCARDIOGRAM (TEE);  Surgeon: Lelon Perla, MD;  Location: [facility];  Service: Cardiovascular;  Laterality: N/A;    ROS:   General:  No weight loss, Fever, chills  HEENT: No recent headaches, no nasal bleeding, no visual changes, no sore throat  Neurologic: No dizziness, blackouts, seizures. No recent symptoms of stroke or mini- stroke. No recent episodes of slurred speech, or temporary blindness.  Cardiac: No recent episodes of chest pain/pressure, no shortness of breath at rest.  No shortness of breath with exertion.  Denies history of atrial fibrillation or irregular heartbeat  Vascular: No history of rest pain in feet.  No history of claudication.  No history of non-healing ulcer, No history of DVT   Pulmonary: No home oxygen, no productive cough, no hemoptysis,  No asthma or wheezing  Musculoskeletal:  '[ ]'  Arthritis, '[ ]'  Low back pain,  '[ ]'  Joint pain  Hematologic:No history of hypercoagulable state.  No history of easy  bleeding.  No history of anemia  Gastrointestinal: No hematochezia or melena,  No gastroesophageal reflux, no trouble swallowing  Urinary: '[ ]'  chronic Kidney disease, '[ ]'  on HD - '[ ]'  MWF or '[ ]'  TTHS, '[ ]'  Burning with urination, '[ ]'  Frequent urination, '[ ]'  Difficulty urinating;   Skin: No rashes  Psychological: No history of anxiety,  No history of depression  Social History Social History   Tobacco Use  . Smoking status: Never Smoker  . Smokeless tobacco: Never Used  Vaping Use  . Vaping Use: Never used  Substance Use Topics  . Alcohol use: Not Currently    Comment: rarely drinks wine  . Drug use: No    Family History Family History  Problem Relation Age of Onset  . Kidney cancer Brother 57  . Hypertension Brother   . Breast cancer Sister 37       LCIS; BRCA negative  . Throat cancer Father 57  smoker  . Breast cancer Sister 87       Lobular breast cancer  . Breast cancer Maternal Aunt        dx in her 100s    Allergies  Allergies  Allergen Reactions  . Atenolol Rash and Other (See Comments)    Exacerbates gout   . Adhesive [Tape] Other (See Comments)    SKIN IS VERY SENSITIVE AND BRUISES AND TEARS EASILY; PLEASE USE AN ALTERNATIVE TO TAPE!!  . Contrast Media [Iodinated Diagnostic Agents] Rash  . Iohexol Rash  . Latex Rash     Current Outpatient Medications  Medication Sig Dispense Refill  . allopurinol (ZYLOPRIM) 100 MG tablet TAKE 1 TABLET TWICE DAILY TO PREVENT GOUT 180 tablet 3  . ARTIFICIAL TEAR SOLUTION OP Place 1 drop into both eyes in the morning and at bedtime.    Marland Kitchen aspirin EC 81 MG tablet Take 81 mg by mouth as needed. Alternates with Excedrin    . aspirin-acetaminophen-caffeine (EXCEDRIN EXTRA STRENGTH) 250-250-65 MG tablet Take 1 tablet by mouth 2 (two) times daily as needed (pain).    Marland Kitchen atorvastatin (LIPITOR) 10 MG tablet Take 1 tablet daily for cholesterol. 90 tablet 1  . b complex-vitamin c-folic acid (NEPHRO-VITE) 0.8 MG TABS tablet  Take 1 tablet by mouth daily.     . Bisacodyl (LAXATIVE PO) Take 1 capsule by mouth 3 (three) times daily.    . cinacalcet (SENSIPAR) 30 MG tablet Take 1 tablet (30 mg total) by mouth daily with supper. 60 tablet 1  . cyclobenzaprine (FLEXERIL) 5 MG tablet Take 1 tablet (5 mg total) by mouth 3 (three) times daily as needed for muscle spasms. 60 tablet 1  . Darbepoetin Alfa (ARANESP) 100 MCG/0.5ML SOSY injection Inject 0.5 mLs (100 mcg total) into the vein every Tuesday with hemodialysis. 4.2 mL   . dextrose 50 % solution Dextrose 50%    . docusate sodium (COLACE) 100 MG capsule Take 200 mg by mouth 2 (two) times daily.     Marland Kitchen doxercalciferol (HECTOROL) 4 MCG/2ML injection Inject 2 mLs (4 mcg total) into the vein Every Tuesday,Thursday,and Saturday with dialysis. 2 mL   . ferric gluconate 125 mg in sodium chloride 0.9 % 100 mL Inject 125 mg into the vein Every Tuesday,Thursday,and Saturday with dialysis.    . Homeopathic Products (THERAWORX RELIEF EX) Apply 1 application topically daily as needed (pain).    . hydrALAZINE (APRESOLINE) 25 MG tablet Take 3 tablets (75 mg total) by mouth every 8 (eight) hours. 270 tablet 1  . insulin NPH-regular Human (70-30) 100 UNIT/ML injection Inject 10-30 Units into the skin 2 (two) times daily with a meal.     . Insulin Syringe-Needle U-100 (SAFETY INSULIN SYRINGES) 30G X 1/2" 1 ML MISC Use to inject insulin twice daily-DX-E11.9 AND Z79.4 100 each 3  . isosorbide dinitrate (ISORDIL) 10 MG tablet TAKE 1 TABLET THREE TIMES A DAY FOR BLOOD PRESSURE AND HEART 270 tablet 3  . Methoxy PEG-Epoetin Beta (MIRCERA IJ) Mircera    . Multiple Vitamins-Minerals (PRESERVISION AREDS 2 PO) Take 2 capsules by mouth daily at 12 noon.     . pregabalin (LYRICA) 100 MG capsule Take 1  Capsule    2 to 3 x   /day if needed for Sciatica pain 90 capsule 0  . Probiotic Product ([place]) CAPS Take 1 capsule by mouth in the morning and at bedtime.    . sevelamer carbonate  (RENVELA) 800 MG tablet Take 2 tablets (1,600  mg total) by mouth 3 (three) times daily with meals. 90 tablet 1  . traZODone (DESYREL) 50 MG tablet Take 50 mg by mouth at bedtime as needed for sleep.     No current facility-administered medications for this visit.    Physical Examination  There were no vitals filed for this visit.  There is no height or weight on file to calculate BMI.  General:  Alert and oriented, no acute distress HEENT: Normal Neck: No bruit or JVD Pulmonary: Clear to auscultation bilaterally Cardiac: Regular Rate and Rhythm without murmur Abdomen: Soft, non-tender, non-distended, no mass, no scars Skin: No rash Extremity Pulses:  2+ radial, brachial, femoral, dorsalis pedis, posterior tibial pulses bilaterally Musculoskeletal: No deformity or edema  Neurologic: Upper and lower extremity motor 5/5 and symmetric  DATA: ***   ASSESSMENT: S/P left femoral to popliteal artery bypass with nonreversed translocated great saphenous vein and left femoral artery endarterectomy with Dacron patch angioplasty on March 28, 2019.   Angiogram performed 03/25/19:  Right lower extremity arteriogram shows again at least 50% common femoral artery stenosis with dominant runoff via the profunda.  There is a very tiny SFA stump with large calcified plaque but then it occludes shortly after takeoff.  Again the SFA is occluded throughout its entire segment.  She does reconstitute heavily diseased below-knee popliteal artery with near occlusive plaque and single-vessel runoff via an anterior tibial artery.   PLAN: ***   Roxy Horseman PA-C [facility] Office: [phone]  MD in clinic [place]

## 2019-11-09 NOTE — Telephone Encounter (Signed)
Pt states she was to have [facility] for dressing changes due to the pressure sore on the bottom of her foot.

## 2019-11-09 NOTE — Telephone Encounter (Signed)
Can you see if they can get [facility] approved for 3 times a week. Wash wound with saline, apply medihoney and cover with 4x4, kerlix, ACE

## 2019-11-10 DIAGNOSIS — N186 End stage renal disease: Secondary | ICD-10-CM | POA: Diagnosis not present

## 2019-11-10 DIAGNOSIS — Z992 Dependence on renal dialysis: Secondary | ICD-10-CM | POA: Diagnosis not present

## 2019-11-10 DIAGNOSIS — N2581 Secondary hyperparathyroidism of renal origin: Secondary | ICD-10-CM | POA: Diagnosis not present

## 2019-11-10 DIAGNOSIS — D631 Anemia in chronic kidney disease: Secondary | ICD-10-CM | POA: Diagnosis not present

## 2019-11-10 DIAGNOSIS — D509 Iron deficiency anemia, unspecified: Secondary | ICD-10-CM | POA: Diagnosis not present

## 2019-11-10 NOTE — Telephone Encounter (Signed)
I informed pt I had just received the [place] orders and was sending to [place], if she was accept to their agency they would call her to schedule [place] visit, if not they would contact me and I would begin search again. Pt states her dressing has not been changed in one week. I told pt I would have scheduler get her in tomorrow for a dressing change with [name].

## 2019-11-10 NOTE — Telephone Encounter (Signed)
[facility] states they will accept pt's care.

## 2019-11-10 NOTE — Telephone Encounter (Signed)
Faxed required form, SnapShot with clinicals and demographics to [place].

## 2019-11-11 ENCOUNTER — Ambulatory Visit (INDEPENDENT_AMBULATORY_CARE_PROVIDER_SITE_OTHER): Payer: Medicare Other | Admitting: Podiatry

## 2019-11-11 ENCOUNTER — Other Ambulatory Visit: Payer: Self-pay

## 2019-11-11 DIAGNOSIS — E11621 Type 2 diabetes mellitus with foot ulcer: Secondary | ICD-10-CM | POA: Diagnosis not present

## 2019-11-11 DIAGNOSIS — M216X1 Other acquired deformities of right foot: Secondary | ICD-10-CM | POA: Diagnosis not present

## 2019-11-11 DIAGNOSIS — L97411 Non-pressure chronic ulcer of right heel and midfoot limited to breakdown of skin: Secondary | ICD-10-CM | POA: Diagnosis not present

## 2019-11-11 DIAGNOSIS — M216X2 Other acquired deformities of left foot: Secondary | ICD-10-CM | POA: Diagnosis not present

## 2019-11-11 DIAGNOSIS — E1149 Type 2 diabetes mellitus with other diabetic neurological complication: Secondary | ICD-10-CM | POA: Diagnosis not present

## 2019-11-12 DIAGNOSIS — D509 Iron deficiency anemia, unspecified: Secondary | ICD-10-CM | POA: Diagnosis not present

## 2019-11-12 DIAGNOSIS — Z992 Dependence on renal dialysis: Secondary | ICD-10-CM | POA: Diagnosis not present

## 2019-11-12 DIAGNOSIS — N2581 Secondary hyperparathyroidism of renal origin: Secondary | ICD-10-CM | POA: Diagnosis not present

## 2019-11-12 DIAGNOSIS — D631 Anemia in chronic kidney disease: Secondary | ICD-10-CM | POA: Diagnosis not present

## 2019-11-12 DIAGNOSIS — N186 End stage renal disease: Secondary | ICD-10-CM | POA: Diagnosis not present

## 2019-11-13 ENCOUNTER — Encounter: Payer: Self-pay | Admitting: Podiatry

## 2019-11-13 NOTE — Progress Notes (Signed)
Subjective: 74 year old female presents the office today for follow-up evaluation of an ulcer to the right midfoot.  Patient states that she is doing well.  She has been doing her regular dressing changes however she has not been able to do it herself.  She has been seeing [name] twice a week.  She states the inserts RN for which she will start wearing them as soon as possible.  She denies any other acute complaints.  Denies any systemic complaints such as fevers, chills, nausea, vomiting. No acute changes since last appointment, and no other complaints at this time.   Objective: AAO x3, NAD DP/PT pulses palpable bilaterally, CRT less than 3 seconds On the right midfoot there is a granular wound with hyperkeratotic periwound and some nonviable tissue present within the wound.  After debridement of the wound measures 0.2 x 0.2 x 0.2 cm.  Prior to debridement it measured 0.3 cm x 0.4 cm x 0.1 cm. after debridement there is a granular wound base and prior there is small meta fibrotic tissue present.  Hyperkeratotic p eriwound.  There is no surrounding erythema, ascending cellulitis.  Is no fluctuation or crepitation.  There is no malodor. No pain with calf compression, swelling, warmth, erythema  Assessment: 74 year old female right foot ulceration  Plan: -All treatment options discussed with the patient including all alternatives, risks, complications.  -I sharply debrided the wound today utilizing the 312 with scalpel to remove nonviable fibrotic, hyperkeratotic tissue to debride the wound down to healthy, granular, bleeding tissue.  There is minimal blood loss in the subcutaneous tissue.  Continue daily dressing changes and offloading at all times.  She was scheduled see vascular surgery however she was left without being seen. -Continue local wound care. -Patient will follow with [name] next week for local wound care. -Patient encouraged to call the office with any questions, concerns,  change in symptoms.   Felipa Furnace DPM

## 2019-11-14 ENCOUNTER — Ambulatory Visit: Payer: Medicare Other | Admitting: Podiatry

## 2019-11-14 ENCOUNTER — Telehealth: Payer: Self-pay | Admitting: Podiatry

## 2019-11-14 ENCOUNTER — Ambulatory Visit: Payer: Medicare Other | Admitting: Physician Assistant

## 2019-11-14 DIAGNOSIS — I2510 Atherosclerotic heart disease of native coronary artery without angina pectoris: Secondary | ICD-10-CM | POA: Diagnosis not present

## 2019-11-14 DIAGNOSIS — I509 Heart failure, unspecified: Secondary | ICD-10-CM | POA: Diagnosis not present

## 2019-11-14 DIAGNOSIS — I110 Hypertensive heart disease with heart failure: Secondary | ICD-10-CM | POA: Diagnosis not present

## 2019-11-14 DIAGNOSIS — E11621 Type 2 diabetes mellitus with foot ulcer: Secondary | ICD-10-CM | POA: Diagnosis not present

## 2019-11-14 DIAGNOSIS — E1151 Type 2 diabetes mellitus with diabetic peripheral angiopathy without gangrene: Secondary | ICD-10-CM | POA: Diagnosis not present

## 2019-11-14 DIAGNOSIS — L97411 Non-pressure chronic ulcer of right heel and midfoot limited to breakdown of skin: Secondary | ICD-10-CM | POA: Diagnosis not present

## 2019-11-14 DIAGNOSIS — Z794 Long term (current) use of insulin: Secondary | ICD-10-CM | POA: Diagnosis not present

## 2019-11-14 DIAGNOSIS — Z7982 Long term (current) use of aspirin: Secondary | ICD-10-CM | POA: Diagnosis not present

## 2019-11-14 NOTE — Telephone Encounter (Signed)
Left message for [name] [facility] to call for information concerning the wound care.

## 2019-11-14 NOTE — Telephone Encounter (Signed)
[facility] called needing clarification on wound orders

## 2019-11-15 ENCOUNTER — Telehealth: Payer: Self-pay | Admitting: *Deleted

## 2019-11-15 DIAGNOSIS — N186 End stage renal disease: Secondary | ICD-10-CM | POA: Diagnosis not present

## 2019-11-15 DIAGNOSIS — N2581 Secondary hyperparathyroidism of renal origin: Secondary | ICD-10-CM | POA: Diagnosis not present

## 2019-11-15 DIAGNOSIS — D509 Iron deficiency anemia, unspecified: Secondary | ICD-10-CM | POA: Diagnosis not present

## 2019-11-15 DIAGNOSIS — Z992 Dependence on renal dialysis: Secondary | ICD-10-CM | POA: Diagnosis not present

## 2019-11-15 DIAGNOSIS — D631 Anemia in chronic kidney disease: Secondary | ICD-10-CM | POA: Diagnosis not present

## 2019-11-15 NOTE — Telephone Encounter (Signed)
[name] states she is calling for wound care orders, to call office supervisor - Anderson Malta [phone].

## 2019-11-15 NOTE — Telephone Encounter (Signed)
[name] states continue Medihoney to the site 3 x week. I spoke with [facility] and informed of Dr. Leigh Aurora orders.

## 2019-11-16 DIAGNOSIS — I2510 Atherosclerotic heart disease of native coronary artery without angina pectoris: Secondary | ICD-10-CM | POA: Diagnosis not present

## 2019-11-16 DIAGNOSIS — I110 Hypertensive heart disease with heart failure: Secondary | ICD-10-CM | POA: Diagnosis not present

## 2019-11-16 DIAGNOSIS — L97411 Non-pressure chronic ulcer of right heel and midfoot limited to breakdown of skin: Secondary | ICD-10-CM | POA: Diagnosis not present

## 2019-11-16 DIAGNOSIS — I509 Heart failure, unspecified: Secondary | ICD-10-CM | POA: Diagnosis not present

## 2019-11-16 DIAGNOSIS — E11621 Type 2 diabetes mellitus with foot ulcer: Secondary | ICD-10-CM | POA: Diagnosis not present

## 2019-11-16 DIAGNOSIS — E1151 Type 2 diabetes mellitus with diabetic peripheral angiopathy without gangrene: Secondary | ICD-10-CM | POA: Diagnosis not present

## 2019-11-17 DIAGNOSIS — N2581 Secondary hyperparathyroidism of renal origin: Secondary | ICD-10-CM | POA: Diagnosis not present

## 2019-11-17 DIAGNOSIS — Z992 Dependence on renal dialysis: Secondary | ICD-10-CM | POA: Diagnosis not present

## 2019-11-17 DIAGNOSIS — D509 Iron deficiency anemia, unspecified: Secondary | ICD-10-CM | POA: Diagnosis not present

## 2019-11-17 DIAGNOSIS — D631 Anemia in chronic kidney disease: Secondary | ICD-10-CM | POA: Diagnosis not present

## 2019-11-17 DIAGNOSIS — N186 End stage renal disease: Secondary | ICD-10-CM | POA: Diagnosis not present

## 2019-11-18 DIAGNOSIS — L97411 Non-pressure chronic ulcer of right heel and midfoot limited to breakdown of skin: Secondary | ICD-10-CM | POA: Diagnosis not present

## 2019-11-18 DIAGNOSIS — H52223 Regular astigmatism, bilateral: Secondary | ICD-10-CM | POA: Diagnosis not present

## 2019-11-18 DIAGNOSIS — Z961 Presence of intraocular lens: Secondary | ICD-10-CM | POA: Diagnosis not present

## 2019-11-18 DIAGNOSIS — I110 Hypertensive heart disease with heart failure: Secondary | ICD-10-CM | POA: Diagnosis not present

## 2019-11-18 DIAGNOSIS — I2510 Atherosclerotic heart disease of native coronary artery without angina pectoris: Secondary | ICD-10-CM | POA: Diagnosis not present

## 2019-11-18 DIAGNOSIS — E113412 Type 2 diabetes mellitus with severe nonproliferative diabetic retinopathy with macular edema, left eye: Secondary | ICD-10-CM | POA: Diagnosis not present

## 2019-11-18 DIAGNOSIS — H524 Presbyopia: Secondary | ICD-10-CM | POA: Diagnosis not present

## 2019-11-18 DIAGNOSIS — E113391 Type 2 diabetes mellitus with moderate nonproliferative diabetic retinopathy without macular edema, right eye: Secondary | ICD-10-CM | POA: Diagnosis not present

## 2019-11-18 DIAGNOSIS — H02831 Dermatochalasis of right upper eyelid: Secondary | ICD-10-CM | POA: Diagnosis not present

## 2019-11-18 DIAGNOSIS — E11621 Type 2 diabetes mellitus with foot ulcer: Secondary | ICD-10-CM | POA: Diagnosis not present

## 2019-11-18 DIAGNOSIS — I509 Heart failure, unspecified: Secondary | ICD-10-CM | POA: Diagnosis not present

## 2019-11-18 DIAGNOSIS — E1151 Type 2 diabetes mellitus with diabetic peripheral angiopathy without gangrene: Secondary | ICD-10-CM | POA: Diagnosis not present

## 2019-11-18 DIAGNOSIS — H02834 Dermatochalasis of left upper eyelid: Secondary | ICD-10-CM | POA: Diagnosis not present

## 2019-11-18 LAB — HM DIABETES EYE EXAM

## 2019-11-19 DIAGNOSIS — N186 End stage renal disease: Secondary | ICD-10-CM | POA: Diagnosis not present

## 2019-11-19 DIAGNOSIS — D509 Iron deficiency anemia, unspecified: Secondary | ICD-10-CM | POA: Diagnosis not present

## 2019-11-19 DIAGNOSIS — D631 Anemia in chronic kidney disease: Secondary | ICD-10-CM | POA: Diagnosis not present

## 2019-11-19 DIAGNOSIS — N2581 Secondary hyperparathyroidism of renal origin: Secondary | ICD-10-CM | POA: Diagnosis not present

## 2019-11-19 DIAGNOSIS — Z992 Dependence on renal dialysis: Secondary | ICD-10-CM | POA: Diagnosis not present

## 2019-11-21 ENCOUNTER — Encounter: Payer: Self-pay | Admitting: Podiatry

## 2019-11-21 ENCOUNTER — Ambulatory Visit (INDEPENDENT_AMBULATORY_CARE_PROVIDER_SITE_OTHER): Payer: Medicare Other | Admitting: Podiatry

## 2019-11-21 ENCOUNTER — Other Ambulatory Visit: Payer: Self-pay

## 2019-11-21 DIAGNOSIS — L02611 Cutaneous abscess of right foot: Secondary | ICD-10-CM | POA: Diagnosis not present

## 2019-11-21 DIAGNOSIS — L97411 Non-pressure chronic ulcer of right heel and midfoot limited to breakdown of skin: Secondary | ICD-10-CM

## 2019-11-21 DIAGNOSIS — I25810 Atherosclerosis of coronary artery bypass graft(s) without angina pectoris: Secondary | ICD-10-CM | POA: Diagnosis not present

## 2019-11-21 MED ORDER — DOXYCYCLINE HYCLATE 100 MG PO TABS
100.0000 mg | ORAL_TABLET | Freq: Two times a day (BID) | ORAL | 0 refills | Status: DC
Start: 2019-11-21 — End: 2019-12-30

## 2019-11-22 DIAGNOSIS — D631 Anemia in chronic kidney disease: Secondary | ICD-10-CM | POA: Diagnosis not present

## 2019-11-22 DIAGNOSIS — N186 End stage renal disease: Secondary | ICD-10-CM | POA: Diagnosis not present

## 2019-11-22 DIAGNOSIS — N2581 Secondary hyperparathyroidism of renal origin: Secondary | ICD-10-CM | POA: Diagnosis not present

## 2019-11-22 DIAGNOSIS — D509 Iron deficiency anemia, unspecified: Secondary | ICD-10-CM | POA: Diagnosis not present

## 2019-11-22 DIAGNOSIS — Z992 Dependence on renal dialysis: Secondary | ICD-10-CM | POA: Diagnosis not present

## 2019-11-23 ENCOUNTER — Other Ambulatory Visit: Payer: Self-pay

## 2019-11-23 ENCOUNTER — Ambulatory Visit (HOSPITAL_COMMUNITY)
Admission: RE | Admit: 2019-11-23 | Discharge: 2019-11-23 | Disposition: A | Discharge status: Home / Self Care | Payer: Medicare Other | Source: Ambulatory Visit | Attending: Cardiology | Admitting: Cardiology

## 2019-11-23 DIAGNOSIS — R0989 Other specified symptoms and signs involving the circulatory and respiratory systems: Secondary | ICD-10-CM | POA: Diagnosis not present

## 2019-11-23 DIAGNOSIS — E1151 Type 2 diabetes mellitus with diabetic peripheral angiopathy without gangrene: Secondary | ICD-10-CM | POA: Diagnosis not present

## 2019-11-23 DIAGNOSIS — I110 Hypertensive heart disease with heart failure: Secondary | ICD-10-CM | POA: Diagnosis not present

## 2019-11-23 DIAGNOSIS — I509 Heart failure, unspecified: Secondary | ICD-10-CM | POA: Diagnosis not present

## 2019-11-23 DIAGNOSIS — E11621 Type 2 diabetes mellitus with foot ulcer: Secondary | ICD-10-CM | POA: Diagnosis not present

## 2019-11-23 DIAGNOSIS — L97411 Non-pressure chronic ulcer of right heel and midfoot limited to breakdown of skin: Secondary | ICD-10-CM | POA: Diagnosis not present

## 2019-11-23 DIAGNOSIS — I2510 Atherosclerotic heart disease of native coronary artery without angina pectoris: Secondary | ICD-10-CM | POA: Diagnosis not present

## 2019-11-23 NOTE — Progress Notes (Signed)
Subjective: 74 year old female presents the office today for follow-up evaluation of an ulcer to the right midfoot.  She is not sure how the wound is doing. Home health has been coming in to do the dressing changes. She has not seen any increase in swelling, redness, drainage or any other signs of infection.  Denies any systemic complaints such as fevers, chills, nausea, vomiting. No acute changes since last appointment, and no other complaints at this time.   Objective: AAO x3, NAD DP/PT pulses palpable bilaterally, CRT less than 3 seconds On the right midfoot there is a granular wound with hyperkeratotic periwound and some nonviable tissue present within the wound.  After debridement of the wound measures 0.4 x 0.4 x 0.4 cm.  Prior to debridement it measured 0.3 cm x 0.2 cm x 0.2 cm. Upon debridement there was a small amount of purulence identified in the wound. There is no probing, undermining, tunneling. No exposed bone or tendon. No flutationor crepitation.  No pain with calf compression, swelling, warmth, erythema  Assessment: 74 year old female right foot ulceration with mild purulence identified.   Plan: -All treatment options discussed with the patient including all alternatives, risks, complications.  -Sharply debrided the wound today utilizing the 312 with scalpel to remove nonviable fibrotic, hyperkeratotic tissue to debride the wound down to healthy, granular, bleeding tissue. Wound culture was obtained today.  There is minimal blood loss in the subcutaneous tissue.  Continue daily dressing changes and offloading at all times.  -Rx doxycycline  -She has not followed up with vascular surgery- had an appt 11/09/2019 and left without being seen.  -Continue local wound care. -Patient encouraged to call the office with any questions, concerns, change in symptoms.   [name] DPM

## 2019-11-24 DIAGNOSIS — N2581 Secondary hyperparathyroidism of renal origin: Secondary | ICD-10-CM | POA: Diagnosis not present

## 2019-11-24 DIAGNOSIS — N186 End stage renal disease: Secondary | ICD-10-CM | POA: Diagnosis not present

## 2019-11-24 DIAGNOSIS — D631 Anemia in chronic kidney disease: Secondary | ICD-10-CM | POA: Diagnosis not present

## 2019-11-24 DIAGNOSIS — Z992 Dependence on renal dialysis: Secondary | ICD-10-CM | POA: Diagnosis not present

## 2019-11-24 DIAGNOSIS — D509 Iron deficiency anemia, unspecified: Secondary | ICD-10-CM | POA: Diagnosis not present

## 2019-11-24 LAB — WOUND CULTURE
MICRO NUMBER:: 10830938
SPECIMEN QUALITY:: ADEQUATE

## 2019-11-25 DIAGNOSIS — L97411 Non-pressure chronic ulcer of right heel and midfoot limited to breakdown of skin: Secondary | ICD-10-CM | POA: Diagnosis not present

## 2019-11-25 DIAGNOSIS — N186 End stage renal disease: Secondary | ICD-10-CM | POA: Diagnosis not present

## 2019-11-25 DIAGNOSIS — Z992 Dependence on renal dialysis: Secondary | ICD-10-CM | POA: Diagnosis not present

## 2019-11-25 DIAGNOSIS — T82858A Stenosis of vascular prosthetic devices, implants and grafts, initial encounter: Secondary | ICD-10-CM | POA: Diagnosis not present

## 2019-11-25 DIAGNOSIS — I871 Compression of vein: Secondary | ICD-10-CM | POA: Diagnosis not present

## 2019-11-25 DIAGNOSIS — I509 Heart failure, unspecified: Secondary | ICD-10-CM | POA: Diagnosis not present

## 2019-11-25 DIAGNOSIS — I110 Hypertensive heart disease with heart failure: Secondary | ICD-10-CM | POA: Diagnosis not present

## 2019-11-25 DIAGNOSIS — E1151 Type 2 diabetes mellitus with diabetic peripheral angiopathy without gangrene: Secondary | ICD-10-CM | POA: Diagnosis not present

## 2019-11-25 DIAGNOSIS — I2510 Atherosclerotic heart disease of native coronary artery without angina pectoris: Secondary | ICD-10-CM | POA: Diagnosis not present

## 2019-11-25 DIAGNOSIS — E11621 Type 2 diabetes mellitus with foot ulcer: Secondary | ICD-10-CM | POA: Diagnosis not present

## 2019-11-26 DIAGNOSIS — D509 Iron deficiency anemia, unspecified: Secondary | ICD-10-CM | POA: Diagnosis not present

## 2019-11-26 DIAGNOSIS — Z992 Dependence on renal dialysis: Secondary | ICD-10-CM | POA: Diagnosis not present

## 2019-11-26 DIAGNOSIS — N2581 Secondary hyperparathyroidism of renal origin: Secondary | ICD-10-CM | POA: Diagnosis not present

## 2019-11-26 DIAGNOSIS — N186 End stage renal disease: Secondary | ICD-10-CM | POA: Diagnosis not present

## 2019-11-26 DIAGNOSIS — D631 Anemia in chronic kidney disease: Secondary | ICD-10-CM | POA: Diagnosis not present

## 2019-11-28 ENCOUNTER — Other Ambulatory Visit: Payer: Self-pay

## 2019-11-28 ENCOUNTER — Ambulatory Visit (INDEPENDENT_AMBULATORY_CARE_PROVIDER_SITE_OTHER): Payer: Medicare Other | Admitting: Podiatry

## 2019-11-28 DIAGNOSIS — E1149 Type 2 diabetes mellitus with other diabetic neurological complication: Secondary | ICD-10-CM

## 2019-11-28 DIAGNOSIS — L97411 Non-pressure chronic ulcer of right heel and midfoot limited to breakdown of skin: Secondary | ICD-10-CM | POA: Diagnosis not present

## 2019-11-29 ENCOUNTER — Other Ambulatory Visit: Payer: Self-pay | Admitting: Internal Medicine

## 2019-11-29 ENCOUNTER — Telehealth: Payer: Self-pay | Admitting: Podiatry

## 2019-11-29 DIAGNOSIS — Z992 Dependence on renal dialysis: Secondary | ICD-10-CM | POA: Diagnosis not present

## 2019-11-29 DIAGNOSIS — N2581 Secondary hyperparathyroidism of renal origin: Secondary | ICD-10-CM | POA: Diagnosis not present

## 2019-11-29 DIAGNOSIS — N186 End stage renal disease: Secondary | ICD-10-CM | POA: Diagnosis not present

## 2019-11-29 DIAGNOSIS — D631 Anemia in chronic kidney disease: Secondary | ICD-10-CM | POA: Diagnosis not present

## 2019-11-29 DIAGNOSIS — D509 Iron deficiency anemia, unspecified: Secondary | ICD-10-CM | POA: Diagnosis not present

## 2019-11-29 NOTE — Telephone Encounter (Signed)
PT called to to let you know that she has gentamicin sulfate and she wanted to know if she can reduce the number of home health visits to 1-2/week. If she doesn't answer, please leave voicemail. Please call home number

## 2019-11-29 NOTE — Telephone Encounter (Signed)
Called patient and left a message and relayed the message to patient. Lattie Haw

## 2019-11-30 NOTE — Telephone Encounter (Signed)
Thank you.   I want to continue home health as she cannot change the bandage herself. If she has someone that can help or she can do it then we can decrease visits. Also OK to use gentamycin cream on the ulcer.

## 2019-11-30 NOTE — Progress Notes (Signed)
Subjective: 74 year old female presents the office today for follow-up evaluation of an ulcer to the right midfoot.  She states that she is doing well and she has not had any significant drainage or pus.  Also wants to have the callus checked in the left foot where she had a wound previously.  She denies any fevers, chills, nausea, vomiting. No calf pain, chest pain, shortness of breath.  Denies any systemic complaints such as fevers, chills, nausea, vomiting. No acute changes since last appointment, and no other complaints at this time.   Objective: AAO x3, NAD DP/PT pulses palpable bilaterally, CRT less than 3 seconds On the right midfoot there is a granular wound with hyperkeratotic periwound and some nonviable tissue present within the wound.  After debridement of the wound the wound was granular however slightly larger at 0.6 x 0.5 cm with a depth of 0.3.  There is no probing to bone, undermining or tunneling there is no drainage or pus or areas of fluctuation or crepitation.  There is no malodor. No pain with calf compression, swelling, warmth, erythema  Assessment: 74 year old female right foot ulceration   Plan: -All treatment options discussed with the patient including all alternatives, risks, complications.  -Sharply debrided the wound today, healthy, bleeding, granular tissue With a #312 blade scalpel. Wound culture reviewed with her.  Continue daily dressing changes.  She states that she has a cream at home that she wants to use possibly if able and she will call back with the name of the screen.  Continue offloading and home health care.  Return in about 2 weeks (around 12/12/2019).  [name] DPM

## 2019-12-01 DIAGNOSIS — D509 Iron deficiency anemia, unspecified: Secondary | ICD-10-CM | POA: Diagnosis not present

## 2019-12-01 DIAGNOSIS — N2581 Secondary hyperparathyroidism of renal origin: Secondary | ICD-10-CM | POA: Diagnosis not present

## 2019-12-01 DIAGNOSIS — Z992 Dependence on renal dialysis: Secondary | ICD-10-CM | POA: Diagnosis not present

## 2019-12-01 DIAGNOSIS — N186 End stage renal disease: Secondary | ICD-10-CM | POA: Diagnosis not present

## 2019-12-01 DIAGNOSIS — D631 Anemia in chronic kidney disease: Secondary | ICD-10-CM | POA: Diagnosis not present

## 2019-12-02 DIAGNOSIS — I110 Hypertensive heart disease with heart failure: Secondary | ICD-10-CM | POA: Diagnosis not present

## 2019-12-02 DIAGNOSIS — E11621 Type 2 diabetes mellitus with foot ulcer: Secondary | ICD-10-CM | POA: Diagnosis not present

## 2019-12-02 DIAGNOSIS — L97411 Non-pressure chronic ulcer of right heel and midfoot limited to breakdown of skin: Secondary | ICD-10-CM | POA: Diagnosis not present

## 2019-12-02 DIAGNOSIS — E1151 Type 2 diabetes mellitus with diabetic peripheral angiopathy without gangrene: Secondary | ICD-10-CM | POA: Diagnosis not present

## 2019-12-02 DIAGNOSIS — I2510 Atherosclerotic heart disease of native coronary artery without angina pectoris: Secondary | ICD-10-CM | POA: Diagnosis not present

## 2019-12-02 DIAGNOSIS — I509 Heart failure, unspecified: Secondary | ICD-10-CM | POA: Diagnosis not present

## 2019-12-02 NOTE — Telephone Encounter (Signed)
Called and spoke with the patient and I stated that I was checking on the patient and patient stated that the home health nurse (stephanie) was coming out today to change bandage and I stated that I could call Colletta [place] at [phone] and give a verbal to do the bandage changes two times a week and I tried to call stephanie and the voice mail is full and can not take messages and called patient back to let her know. Lattie Haw

## 2019-12-03 DIAGNOSIS — Z992 Dependence on renal dialysis: Secondary | ICD-10-CM | POA: Diagnosis not present

## 2019-12-03 DIAGNOSIS — D509 Iron deficiency anemia, unspecified: Secondary | ICD-10-CM | POA: Diagnosis not present

## 2019-12-03 DIAGNOSIS — D631 Anemia in chronic kidney disease: Secondary | ICD-10-CM | POA: Diagnosis not present

## 2019-12-03 DIAGNOSIS — N186 End stage renal disease: Secondary | ICD-10-CM | POA: Diagnosis not present

## 2019-12-03 DIAGNOSIS — N2581 Secondary hyperparathyroidism of renal origin: Secondary | ICD-10-CM | POA: Diagnosis not present

## 2019-12-05 DIAGNOSIS — H35373 Puckering of macula, bilateral: Secondary | ICD-10-CM | POA: Diagnosis not present

## 2019-12-05 DIAGNOSIS — E113313 Type 2 diabetes mellitus with moderate nonproliferative diabetic retinopathy with macular edema, bilateral: Secondary | ICD-10-CM | POA: Diagnosis not present

## 2019-12-06 DIAGNOSIS — N186 End stage renal disease: Secondary | ICD-10-CM | POA: Diagnosis not present

## 2019-12-06 DIAGNOSIS — N2581 Secondary hyperparathyroidism of renal origin: Secondary | ICD-10-CM | POA: Diagnosis not present

## 2019-12-06 DIAGNOSIS — D631 Anemia in chronic kidney disease: Secondary | ICD-10-CM | POA: Diagnosis not present

## 2019-12-06 DIAGNOSIS — D509 Iron deficiency anemia, unspecified: Secondary | ICD-10-CM | POA: Diagnosis not present

## 2019-12-06 DIAGNOSIS — Z992 Dependence on renal dialysis: Secondary | ICD-10-CM | POA: Diagnosis not present

## 2019-12-07 DIAGNOSIS — N186 End stage renal disease: Secondary | ICD-10-CM | POA: Diagnosis not present

## 2019-12-07 DIAGNOSIS — E1122 Type 2 diabetes mellitus with diabetic chronic kidney disease: Secondary | ICD-10-CM | POA: Diagnosis not present

## 2019-12-07 DIAGNOSIS — Z992 Dependence on renal dialysis: Secondary | ICD-10-CM | POA: Diagnosis not present

## 2019-12-08 DIAGNOSIS — N2581 Secondary hyperparathyroidism of renal origin: Secondary | ICD-10-CM | POA: Diagnosis not present

## 2019-12-08 DIAGNOSIS — D509 Iron deficiency anemia, unspecified: Secondary | ICD-10-CM | POA: Diagnosis not present

## 2019-12-08 DIAGNOSIS — Z992 Dependence on renal dialysis: Secondary | ICD-10-CM | POA: Diagnosis not present

## 2019-12-08 DIAGNOSIS — N186 End stage renal disease: Secondary | ICD-10-CM | POA: Diagnosis not present

## 2019-12-09 DIAGNOSIS — I110 Hypertensive heart disease with heart failure: Secondary | ICD-10-CM | POA: Diagnosis not present

## 2019-12-09 DIAGNOSIS — I509 Heart failure, unspecified: Secondary | ICD-10-CM | POA: Diagnosis not present

## 2019-12-09 DIAGNOSIS — E11621 Type 2 diabetes mellitus with foot ulcer: Secondary | ICD-10-CM | POA: Diagnosis not present

## 2019-12-09 DIAGNOSIS — L97411 Non-pressure chronic ulcer of right heel and midfoot limited to breakdown of skin: Secondary | ICD-10-CM | POA: Diagnosis not present

## 2019-12-09 DIAGNOSIS — I2510 Atherosclerotic heart disease of native coronary artery without angina pectoris: Secondary | ICD-10-CM | POA: Diagnosis not present

## 2019-12-09 DIAGNOSIS — E1151 Type 2 diabetes mellitus with diabetic peripheral angiopathy without gangrene: Secondary | ICD-10-CM | POA: Diagnosis not present

## 2019-12-10 DIAGNOSIS — Z992 Dependence on renal dialysis: Secondary | ICD-10-CM | POA: Diagnosis not present

## 2019-12-10 DIAGNOSIS — D509 Iron deficiency anemia, unspecified: Secondary | ICD-10-CM | POA: Diagnosis not present

## 2019-12-10 DIAGNOSIS — N186 End stage renal disease: Secondary | ICD-10-CM | POA: Diagnosis not present

## 2019-12-10 DIAGNOSIS — N2581 Secondary hyperparathyroidism of renal origin: Secondary | ICD-10-CM | POA: Diagnosis not present

## 2019-12-12 DIAGNOSIS — L97411 Non-pressure chronic ulcer of right heel and midfoot limited to breakdown of skin: Secondary | ICD-10-CM | POA: Diagnosis not present

## 2019-12-12 DIAGNOSIS — I110 Hypertensive heart disease with heart failure: Secondary | ICD-10-CM | POA: Diagnosis not present

## 2019-12-12 DIAGNOSIS — E11621 Type 2 diabetes mellitus with foot ulcer: Secondary | ICD-10-CM | POA: Diagnosis not present

## 2019-12-12 DIAGNOSIS — I509 Heart failure, unspecified: Secondary | ICD-10-CM | POA: Diagnosis not present

## 2019-12-12 DIAGNOSIS — E1151 Type 2 diabetes mellitus with diabetic peripheral angiopathy without gangrene: Secondary | ICD-10-CM | POA: Diagnosis not present

## 2019-12-12 DIAGNOSIS — I2510 Atherosclerotic heart disease of native coronary artery without angina pectoris: Secondary | ICD-10-CM | POA: Diagnosis not present

## 2019-12-13 DIAGNOSIS — Z992 Dependence on renal dialysis: Secondary | ICD-10-CM | POA: Diagnosis not present

## 2019-12-13 DIAGNOSIS — N186 End stage renal disease: Secondary | ICD-10-CM | POA: Diagnosis not present

## 2019-12-13 DIAGNOSIS — D509 Iron deficiency anemia, unspecified: Secondary | ICD-10-CM | POA: Diagnosis not present

## 2019-12-13 DIAGNOSIS — N2581 Secondary hyperparathyroidism of renal origin: Secondary | ICD-10-CM | POA: Diagnosis not present

## 2019-12-14 DIAGNOSIS — I110 Hypertensive heart disease with heart failure: Secondary | ICD-10-CM | POA: Diagnosis not present

## 2019-12-14 DIAGNOSIS — Z7982 Long term (current) use of aspirin: Secondary | ICD-10-CM | POA: Diagnosis not present

## 2019-12-14 DIAGNOSIS — L97411 Non-pressure chronic ulcer of right heel and midfoot limited to breakdown of skin: Secondary | ICD-10-CM | POA: Diagnosis not present

## 2019-12-14 DIAGNOSIS — E11621 Type 2 diabetes mellitus with foot ulcer: Secondary | ICD-10-CM | POA: Diagnosis not present

## 2019-12-14 DIAGNOSIS — E1151 Type 2 diabetes mellitus with diabetic peripheral angiopathy without gangrene: Secondary | ICD-10-CM | POA: Diagnosis not present

## 2019-12-14 DIAGNOSIS — I509 Heart failure, unspecified: Secondary | ICD-10-CM | POA: Diagnosis not present

## 2019-12-14 DIAGNOSIS — Z794 Long term (current) use of insulin: Secondary | ICD-10-CM | POA: Diagnosis not present

## 2019-12-14 DIAGNOSIS — I2510 Atherosclerotic heart disease of native coronary artery without angina pectoris: Secondary | ICD-10-CM | POA: Diagnosis not present

## 2019-12-15 DIAGNOSIS — N186 End stage renal disease: Secondary | ICD-10-CM | POA: Diagnosis not present

## 2019-12-15 DIAGNOSIS — D509 Iron deficiency anemia, unspecified: Secondary | ICD-10-CM | POA: Diagnosis not present

## 2019-12-15 DIAGNOSIS — Z992 Dependence on renal dialysis: Secondary | ICD-10-CM | POA: Diagnosis not present

## 2019-12-15 DIAGNOSIS — N2581 Secondary hyperparathyroidism of renal origin: Secondary | ICD-10-CM | POA: Diagnosis not present

## 2019-12-16 ENCOUNTER — Other Ambulatory Visit: Payer: Self-pay

## 2019-12-16 ENCOUNTER — Ambulatory Visit (INDEPENDENT_AMBULATORY_CARE_PROVIDER_SITE_OTHER): Payer: Medicare Other | Admitting: Podiatry

## 2019-12-16 DIAGNOSIS — L97411 Non-pressure chronic ulcer of right heel and midfoot limited to breakdown of skin: Secondary | ICD-10-CM | POA: Diagnosis not present

## 2019-12-16 DIAGNOSIS — E1149 Type 2 diabetes mellitus with other diabetic neurological complication: Secondary | ICD-10-CM

## 2019-12-17 DIAGNOSIS — Z992 Dependence on renal dialysis: Secondary | ICD-10-CM | POA: Diagnosis not present

## 2019-12-17 DIAGNOSIS — N2581 Secondary hyperparathyroidism of renal origin: Secondary | ICD-10-CM | POA: Diagnosis not present

## 2019-12-17 DIAGNOSIS — N186 End stage renal disease: Secondary | ICD-10-CM | POA: Diagnosis not present

## 2019-12-17 DIAGNOSIS — D509 Iron deficiency anemia, unspecified: Secondary | ICD-10-CM | POA: Diagnosis not present

## 2019-12-20 ENCOUNTER — Encounter: Payer: Self-pay | Admitting: Podiatry

## 2019-12-20 DIAGNOSIS — Z992 Dependence on renal dialysis: Secondary | ICD-10-CM | POA: Diagnosis not present

## 2019-12-20 DIAGNOSIS — N186 End stage renal disease: Secondary | ICD-10-CM | POA: Diagnosis not present

## 2019-12-20 DIAGNOSIS — D509 Iron deficiency anemia, unspecified: Secondary | ICD-10-CM | POA: Diagnosis not present

## 2019-12-20 DIAGNOSIS — N2581 Secondary hyperparathyroidism of renal origin: Secondary | ICD-10-CM | POA: Diagnosis not present

## 2019-12-20 NOTE — Progress Notes (Signed)
Subjective: 74 year old female presents the office today for follow-up evaluation of an ulcer to the right midfoot.  S she states she is doing really well.  The ulcer seems to be still present.  Patient states when she has been walking for long period of time she does have some pain with it.  She is being treated by Dr. Jacqualyn [name] for this midfoot ulcer.  She denies any other acute complaints.  Objective: AAO x3, NAD DP/PT pulses palpable bilaterally, CRT less than 3 seconds On the right midfoot there is a granular wound with hyperkeratotic periwound and some nonviable tissue present within the wound.  After debridement of the wound the wound was granular however slightly smaller at 0.5 x 0.5 cm with a depth of 0.3.  There is no probing to bone, undermining or tunneling there is no drainage or pus or areas of fluctuation or crepitation.  There is no malodor. No pain with calf compression, swelling, warmth, erythema  Assessment: 74 year old female right foot ulceration   Plan: -All treatment options discussed with the patient including all alternatives, risks, complications.  -Sharply debrided the wound today, healthy, bleeding, granular tissue With a #312 blade scalpel. Continue daily dressing changes.   -Continue to offload as well as home health dressing changes.  Return in about 2 weeks (around 12/30/2019) for with Dr. Jacqualyn [name].  [name] DPM

## 2019-12-21 DIAGNOSIS — I509 Heart failure, unspecified: Secondary | ICD-10-CM | POA: Diagnosis not present

## 2019-12-21 DIAGNOSIS — E1151 Type 2 diabetes mellitus with diabetic peripheral angiopathy without gangrene: Secondary | ICD-10-CM | POA: Diagnosis not present

## 2019-12-21 DIAGNOSIS — I110 Hypertensive heart disease with heart failure: Secondary | ICD-10-CM | POA: Diagnosis not present

## 2019-12-21 DIAGNOSIS — I2510 Atherosclerotic heart disease of native coronary artery without angina pectoris: Secondary | ICD-10-CM | POA: Diagnosis not present

## 2019-12-21 DIAGNOSIS — L97411 Non-pressure chronic ulcer of right heel and midfoot limited to breakdown of skin: Secondary | ICD-10-CM | POA: Diagnosis not present

## 2019-12-21 DIAGNOSIS — E11621 Type 2 diabetes mellitus with foot ulcer: Secondary | ICD-10-CM | POA: Diagnosis not present

## 2019-12-22 DIAGNOSIS — Z992 Dependence on renal dialysis: Secondary | ICD-10-CM | POA: Diagnosis not present

## 2019-12-22 DIAGNOSIS — N186 End stage renal disease: Secondary | ICD-10-CM | POA: Diagnosis not present

## 2019-12-22 DIAGNOSIS — N2581 Secondary hyperparathyroidism of renal origin: Secondary | ICD-10-CM | POA: Diagnosis not present

## 2019-12-22 DIAGNOSIS — D509 Iron deficiency anemia, unspecified: Secondary | ICD-10-CM | POA: Diagnosis not present

## 2019-12-24 DIAGNOSIS — Z992 Dependence on renal dialysis: Secondary | ICD-10-CM | POA: Diagnosis not present

## 2019-12-24 DIAGNOSIS — N2581 Secondary hyperparathyroidism of renal origin: Secondary | ICD-10-CM | POA: Diagnosis not present

## 2019-12-24 DIAGNOSIS — D509 Iron deficiency anemia, unspecified: Secondary | ICD-10-CM | POA: Diagnosis not present

## 2019-12-24 DIAGNOSIS — N186 End stage renal disease: Secondary | ICD-10-CM | POA: Diagnosis not present

## 2019-12-26 ENCOUNTER — Other Ambulatory Visit: Payer: Self-pay

## 2019-12-26 ENCOUNTER — Ambulatory Visit (INDEPENDENT_AMBULATORY_CARE_PROVIDER_SITE_OTHER): Payer: Medicare Other | Admitting: Podiatry

## 2019-12-26 DIAGNOSIS — L97411 Non-pressure chronic ulcer of right heel and midfoot limited to breakdown of skin: Secondary | ICD-10-CM | POA: Diagnosis not present

## 2019-12-26 DIAGNOSIS — I25810 Atherosclerosis of coronary artery bypass graft(s) without angina pectoris: Secondary | ICD-10-CM | POA: Diagnosis not present

## 2019-12-26 DIAGNOSIS — I739 Peripheral vascular disease, unspecified: Secondary | ICD-10-CM

## 2019-12-26 DIAGNOSIS — E1149 Type 2 diabetes mellitus with other diabetic neurological complication: Secondary | ICD-10-CM

## 2019-12-28 ENCOUNTER — Telehealth: Payer: Self-pay | Admitting: *Deleted

## 2019-12-28 NOTE — Telephone Encounter (Signed)
Patient called requesting a prescription for hydrocodone. The wound has been sore since it was worked on Monday. Hydrocodone has worked for her in the past. Please advise.

## 2019-12-29 ENCOUNTER — Other Ambulatory Visit: Payer: Self-pay | Admitting: Podiatry

## 2019-12-29 DIAGNOSIS — N186 End stage renal disease: Secondary | ICD-10-CM | POA: Diagnosis not present

## 2019-12-29 DIAGNOSIS — Z992 Dependence on renal dialysis: Secondary | ICD-10-CM | POA: Diagnosis not present

## 2019-12-29 DIAGNOSIS — D509 Iron deficiency anemia, unspecified: Secondary | ICD-10-CM | POA: Diagnosis not present

## 2019-12-29 DIAGNOSIS — N2581 Secondary hyperparathyroidism of renal origin: Secondary | ICD-10-CM | POA: Diagnosis not present

## 2019-12-29 MED ORDER — HYDROCODONE-ACETAMINOPHEN 5-325 MG PO TABS: 1.0000 | ORAL_TABLET | Freq: Four times a day (QID) | ORAL | 0 refills | Status: DC | PRN

## 2019-12-29 NOTE — Telephone Encounter (Signed)
Called patient-  She relates no signs of infection to the foot. She says the area just seems to be more sore since having it trimmed at last appointment, "a rawness". I explained the signs of infection and advised she watch for these and call immediately if it presents.   I let her know prescription for pain meds was called into the pharmacy.  She said she had an appointment with vascular already sometime in October and advised she should call and get in sooner given she had this wound on her foot. Patient said she would call them.

## 2019-12-29 NOTE — Telephone Encounter (Signed)
I sent pain medication to the pharmacy. Can you please make sure she is not having any fevers, chills or any other signs of infection? Also I sent a note to vascular but I don't see an appointment made for her (she is known to them). Please let her know she needs to get an appointment to see them.

## 2019-12-29 NOTE — Progress Notes (Signed)
Subjective: 74 year old female presents the office today for follow-up evaluation of an ulcer to the right midfoot.  Also there is a new area skin breakdown the lateral aspect the right foot.  This is likely coming from where the bandages been wrapped.  Denies any increase in swelling or redness or drainage or pus.  She denies any fevers, chills, nausea, vomiting.  No calf pain, chest pain, shortness of breath.  Objective: AAO x3, NAD DP/PT pulses decreased bilaterally, CRT less than 3 seconds On the right midfoot granular ulceration measuring the same size of 0.5 x 0.5 x 0.3 cm without any probing, undermining or tunneling.  However there is no area of skin breakdown with eschar along the lateral aspect of the foot.  I think this is coming with the bandage being wrapped too tightly.  There is no surrounding erythema, ascending cellulitis.  No fluctuation crepitation. No pain with calf compression, swelling, warmth, erythema  Assessment: 74 year old female right foot ulceration with new ulceration lateral foot  Plan: -All treatment options discussed with the patient including all alternatives, risks, complications.  -I sharply debrided the wound on the plantar aspect with any complications to healthy, bleeding, viable tissue.  Continue with dressing changes and advised to increase this about 3 times a week.  The pain has not been changed regularly I think this is contributing to the wound on the lateral aspect of the right foot.  I would keep Betadine on the wound on the right foot to keep it clean and dry.  Continue elevation offloading. -She is follow-up with vascular surgery. -Discussed wound care referral but wanted to hold off on that for right now.  Return in about 2 weeks (around 01/09/2020).  [name] DPM

## 2019-12-30 ENCOUNTER — Other Ambulatory Visit: Payer: Self-pay | Admitting: Podiatry

## 2019-12-30 DIAGNOSIS — L97411 Non-pressure chronic ulcer of right heel and midfoot limited to breakdown of skin: Secondary | ICD-10-CM | POA: Diagnosis not present

## 2019-12-30 DIAGNOSIS — I2510 Atherosclerotic heart disease of native coronary artery without angina pectoris: Secondary | ICD-10-CM | POA: Diagnosis not present

## 2019-12-30 DIAGNOSIS — E11621 Type 2 diabetes mellitus with foot ulcer: Secondary | ICD-10-CM | POA: Diagnosis not present

## 2019-12-30 DIAGNOSIS — E1151 Type 2 diabetes mellitus with diabetic peripheral angiopathy without gangrene: Secondary | ICD-10-CM | POA: Diagnosis not present

## 2019-12-30 DIAGNOSIS — I509 Heart failure, unspecified: Secondary | ICD-10-CM | POA: Diagnosis not present

## 2019-12-30 DIAGNOSIS — I110 Hypertensive heart disease with heart failure: Secondary | ICD-10-CM | POA: Diagnosis not present

## 2019-12-30 MED ORDER — DOXYCYCLINE HYCLATE 100 MG PO TABS
100.0000 mg | ORAL_TABLET | Freq: Two times a day (BID) | ORAL | 0 refills | Status: DC
Start: 2019-12-30 — End: 2020-02-23

## 2019-12-31 DIAGNOSIS — Z992 Dependence on renal dialysis: Secondary | ICD-10-CM | POA: Diagnosis not present

## 2019-12-31 DIAGNOSIS — D509 Iron deficiency anemia, unspecified: Secondary | ICD-10-CM | POA: Diagnosis not present

## 2019-12-31 DIAGNOSIS — N2581 Secondary hyperparathyroidism of renal origin: Secondary | ICD-10-CM | POA: Diagnosis not present

## 2019-12-31 DIAGNOSIS — N186 End stage renal disease: Secondary | ICD-10-CM | POA: Diagnosis not present

## 2020-01-03 DIAGNOSIS — N186 End stage renal disease: Secondary | ICD-10-CM | POA: Diagnosis not present

## 2020-01-03 DIAGNOSIS — Z992 Dependence on renal dialysis: Secondary | ICD-10-CM | POA: Diagnosis not present

## 2020-01-03 DIAGNOSIS — N2581 Secondary hyperparathyroidism of renal origin: Secondary | ICD-10-CM | POA: Diagnosis not present

## 2020-01-03 DIAGNOSIS — D509 Iron deficiency anemia, unspecified: Secondary | ICD-10-CM | POA: Diagnosis not present

## 2020-01-04 DIAGNOSIS — E1151 Type 2 diabetes mellitus with diabetic peripheral angiopathy without gangrene: Secondary | ICD-10-CM | POA: Diagnosis not present

## 2020-01-04 DIAGNOSIS — I509 Heart failure, unspecified: Secondary | ICD-10-CM | POA: Diagnosis not present

## 2020-01-04 DIAGNOSIS — I110 Hypertensive heart disease with heart failure: Secondary | ICD-10-CM | POA: Diagnosis not present

## 2020-01-04 DIAGNOSIS — L97411 Non-pressure chronic ulcer of right heel and midfoot limited to breakdown of skin: Secondary | ICD-10-CM | POA: Diagnosis not present

## 2020-01-04 DIAGNOSIS — E11621 Type 2 diabetes mellitus with foot ulcer: Secondary | ICD-10-CM | POA: Diagnosis not present

## 2020-01-04 DIAGNOSIS — I2510 Atherosclerotic heart disease of native coronary artery without angina pectoris: Secondary | ICD-10-CM | POA: Diagnosis not present

## 2020-01-05 DIAGNOSIS — Z992 Dependence on renal dialysis: Secondary | ICD-10-CM | POA: Diagnosis not present

## 2020-01-05 DIAGNOSIS — N186 End stage renal disease: Secondary | ICD-10-CM | POA: Diagnosis not present

## 2020-01-05 DIAGNOSIS — D509 Iron deficiency anemia, unspecified: Secondary | ICD-10-CM | POA: Diagnosis not present

## 2020-01-05 DIAGNOSIS — N2581 Secondary hyperparathyroidism of renal origin: Secondary | ICD-10-CM | POA: Diagnosis not present

## 2020-01-06 DIAGNOSIS — E1151 Type 2 diabetes mellitus with diabetic peripheral angiopathy without gangrene: Secondary | ICD-10-CM | POA: Diagnosis not present

## 2020-01-06 DIAGNOSIS — I2510 Atherosclerotic heart disease of native coronary artery without angina pectoris: Secondary | ICD-10-CM | POA: Diagnosis not present

## 2020-01-06 DIAGNOSIS — D509 Iron deficiency anemia, unspecified: Secondary | ICD-10-CM | POA: Diagnosis not present

## 2020-01-06 DIAGNOSIS — E11621 Type 2 diabetes mellitus with foot ulcer: Secondary | ICD-10-CM | POA: Diagnosis not present

## 2020-01-06 DIAGNOSIS — I110 Hypertensive heart disease with heart failure: Secondary | ICD-10-CM | POA: Diagnosis not present

## 2020-01-06 DIAGNOSIS — Z992 Dependence on renal dialysis: Secondary | ICD-10-CM | POA: Diagnosis not present

## 2020-01-06 DIAGNOSIS — L97411 Non-pressure chronic ulcer of right heel and midfoot limited to breakdown of skin: Secondary | ICD-10-CM | POA: Diagnosis not present

## 2020-01-06 DIAGNOSIS — E1122 Type 2 diabetes mellitus with diabetic chronic kidney disease: Secondary | ICD-10-CM | POA: Diagnosis not present

## 2020-01-06 DIAGNOSIS — N186 End stage renal disease: Secondary | ICD-10-CM | POA: Diagnosis not present

## 2020-01-06 DIAGNOSIS — I509 Heart failure, unspecified: Secondary | ICD-10-CM | POA: Diagnosis not present

## 2020-01-06 DIAGNOSIS — N2581 Secondary hyperparathyroidism of renal origin: Secondary | ICD-10-CM | POA: Diagnosis not present

## 2020-01-07 DIAGNOSIS — N186 End stage renal disease: Secondary | ICD-10-CM | POA: Diagnosis not present

## 2020-01-07 DIAGNOSIS — N2581 Secondary hyperparathyroidism of renal origin: Secondary | ICD-10-CM | POA: Diagnosis not present

## 2020-01-07 DIAGNOSIS — D509 Iron deficiency anemia, unspecified: Secondary | ICD-10-CM | POA: Diagnosis not present

## 2020-01-07 DIAGNOSIS — Z992 Dependence on renal dialysis: Secondary | ICD-10-CM | POA: Diagnosis not present

## 2020-01-09 ENCOUNTER — Ambulatory Visit (INDEPENDENT_AMBULATORY_CARE_PROVIDER_SITE_OTHER): Payer: Medicare Other | Admitting: Podiatry

## 2020-01-09 ENCOUNTER — Other Ambulatory Visit: Payer: Self-pay

## 2020-01-09 DIAGNOSIS — I739 Peripheral vascular disease, unspecified: Secondary | ICD-10-CM

## 2020-01-09 DIAGNOSIS — L97411 Non-pressure chronic ulcer of right heel and midfoot limited to breakdown of skin: Secondary | ICD-10-CM

## 2020-01-10 DIAGNOSIS — D509 Iron deficiency anemia, unspecified: Secondary | ICD-10-CM | POA: Diagnosis not present

## 2020-01-10 DIAGNOSIS — Z992 Dependence on renal dialysis: Secondary | ICD-10-CM | POA: Diagnosis not present

## 2020-01-10 DIAGNOSIS — N186 End stage renal disease: Secondary | ICD-10-CM | POA: Diagnosis not present

## 2020-01-10 DIAGNOSIS — N2581 Secondary hyperparathyroidism of renal origin: Secondary | ICD-10-CM | POA: Diagnosis not present

## 2020-01-11 ENCOUNTER — Telehealth: Payer: Self-pay | Admitting: *Deleted

## 2020-01-11 DIAGNOSIS — L97411 Non-pressure chronic ulcer of right heel and midfoot limited to breakdown of skin: Secondary | ICD-10-CM | POA: Diagnosis not present

## 2020-01-11 DIAGNOSIS — I2510 Atherosclerotic heart disease of native coronary artery without angina pectoris: Secondary | ICD-10-CM | POA: Diagnosis not present

## 2020-01-11 DIAGNOSIS — E1151 Type 2 diabetes mellitus with diabetic peripheral angiopathy without gangrene: Secondary | ICD-10-CM | POA: Diagnosis not present

## 2020-01-11 DIAGNOSIS — I509 Heart failure, unspecified: Secondary | ICD-10-CM | POA: Diagnosis not present

## 2020-01-11 DIAGNOSIS — I110 Hypertensive heart disease with heart failure: Secondary | ICD-10-CM | POA: Diagnosis not present

## 2020-01-11 DIAGNOSIS — E11621 Type 2 diabetes mellitus with foot ulcer: Secondary | ICD-10-CM | POA: Diagnosis not present

## 2020-01-11 NOTE — Telephone Encounter (Signed)
Per Dr Melford Aase, [place] for wound care 1 time a week for 9 weeks. Colletta [place] is aware.

## 2020-01-12 DIAGNOSIS — N186 End stage renal disease: Secondary | ICD-10-CM | POA: Diagnosis not present

## 2020-01-12 DIAGNOSIS — N2581 Secondary hyperparathyroidism of renal origin: Secondary | ICD-10-CM | POA: Diagnosis not present

## 2020-01-12 DIAGNOSIS — Z992 Dependence on renal dialysis: Secondary | ICD-10-CM | POA: Diagnosis not present

## 2020-01-12 DIAGNOSIS — D509 Iron deficiency anemia, unspecified: Secondary | ICD-10-CM | POA: Diagnosis not present

## 2020-01-13 DIAGNOSIS — E1140 Type 2 diabetes mellitus with diabetic neuropathy, unspecified: Secondary | ICD-10-CM | POA: Diagnosis not present

## 2020-01-13 DIAGNOSIS — N186 End stage renal disease: Secondary | ICD-10-CM | POA: Diagnosis not present

## 2020-01-13 DIAGNOSIS — Z7982 Long term (current) use of aspirin: Secondary | ICD-10-CM | POA: Diagnosis not present

## 2020-01-13 DIAGNOSIS — I509 Heart failure, unspecified: Secondary | ICD-10-CM | POA: Diagnosis not present

## 2020-01-13 DIAGNOSIS — Z9181 History of falling: Secondary | ICD-10-CM | POA: Diagnosis not present

## 2020-01-13 DIAGNOSIS — I2510 Atherosclerotic heart disease of native coronary artery without angina pectoris: Secondary | ICD-10-CM | POA: Diagnosis not present

## 2020-01-13 DIAGNOSIS — E11621 Type 2 diabetes mellitus with foot ulcer: Secondary | ICD-10-CM | POA: Diagnosis not present

## 2020-01-13 DIAGNOSIS — Z794 Long term (current) use of insulin: Secondary | ICD-10-CM | POA: Diagnosis not present

## 2020-01-13 DIAGNOSIS — E1122 Type 2 diabetes mellitus with diabetic chronic kidney disease: Secondary | ICD-10-CM | POA: Diagnosis not present

## 2020-01-13 DIAGNOSIS — Z7984 Long term (current) use of oral hypoglycemic drugs: Secondary | ICD-10-CM | POA: Diagnosis not present

## 2020-01-13 DIAGNOSIS — L97412 Non-pressure chronic ulcer of right heel and midfoot with fat layer exposed: Secondary | ICD-10-CM | POA: Diagnosis not present

## 2020-01-13 DIAGNOSIS — L97411 Non-pressure chronic ulcer of right heel and midfoot limited to breakdown of skin: Secondary | ICD-10-CM | POA: Diagnosis not present

## 2020-01-13 DIAGNOSIS — Z992 Dependence on renal dialysis: Secondary | ICD-10-CM | POA: Diagnosis not present

## 2020-01-13 DIAGNOSIS — I110 Hypertensive heart disease with heart failure: Secondary | ICD-10-CM | POA: Diagnosis not present

## 2020-01-13 DIAGNOSIS — E1151 Type 2 diabetes mellitus with diabetic peripheral angiopathy without gangrene: Secondary | ICD-10-CM | POA: Diagnosis not present

## 2020-01-13 DIAGNOSIS — S91301D Unspecified open wound, right foot, subsequent encounter: Secondary | ICD-10-CM | POA: Diagnosis not present

## 2020-01-14 DIAGNOSIS — D509 Iron deficiency anemia, unspecified: Secondary | ICD-10-CM | POA: Diagnosis not present

## 2020-01-14 DIAGNOSIS — N2581 Secondary hyperparathyroidism of renal origin: Secondary | ICD-10-CM | POA: Diagnosis not present

## 2020-01-14 DIAGNOSIS — N186 End stage renal disease: Secondary | ICD-10-CM | POA: Diagnosis not present

## 2020-01-14 DIAGNOSIS — Z992 Dependence on renal dialysis: Secondary | ICD-10-CM | POA: Diagnosis not present

## 2020-01-16 NOTE — Progress Notes (Signed)
Subjective: 74 year old female presents the office today for follow-up evaluation of an ulcer to the right midfoot.  That she is doing well but she does not look at her feet regularly.  Home health still been changing the bandage.  She has not seen any increase in swelling or redness. She denies any fevers, chills, nausea, vomiting.  No calf pain, chest pain, shortness of breath.  Objective: AAO x3, NAD DP/PT pulses decreased bilaterally, CRT less than 3 seconds On the right midfoot granular ulceration measuring smaller today 0.3 x 0.2 cm is more superficial with a granular wound base.  There is no surrounding erythema, ascending cellulitis but there is no fluctuation crepitation.  On the lateral aspect of the foot is a black eschar measuring 5 x 1.5 cm.  It is dried and there is no surrounding erythema, ascending cellulitis peer there is no drainage or pus any fluctuation crepitation. No pain with calf compression, swelling, warmth, erythema  Assessment: 74 year old female right foot ulceration with new ulceration lateral foot  Plan: -All treatment options discussed with the patient including all alternatives, risks, complications.  -Sharply debrided the wound today and the plantar midfoot had a complications or bleeding I also sharply debrided some of the loose eschar to the lateral foot without any complications.  Continue gentamicin cream to the plantar midfoot and small amount of Betadine to the lateral foot.  Offloading. -She is in agreement to referral to the wound care center.  This was placed today. -Elevation -Encouraged to follow-up with vascular surgery. -I sharply debrided the wound on the plantar aspect with any complications to healthy, bleeding, viable tissue utilizing the 312 with scalpel without any complications.  Continue with dressing changes continue with gentamicin cream plantar midfoot.  I would keep Betadine on the wound on the right foot to keep it clean and dry.  Continue  elevation offloading. -She is follow-up with vascular surgery. -Discussed wound care referral but wanted to hold off on that for right now.  Return in about 2 weeks (around 01/23/2020).   [name] DPM

## 2020-01-17 DIAGNOSIS — Z992 Dependence on renal dialysis: Secondary | ICD-10-CM | POA: Diagnosis not present

## 2020-01-17 DIAGNOSIS — N2581 Secondary hyperparathyroidism of renal origin: Secondary | ICD-10-CM | POA: Diagnosis not present

## 2020-01-17 DIAGNOSIS — N186 End stage renal disease: Secondary | ICD-10-CM | POA: Diagnosis not present

## 2020-01-17 DIAGNOSIS — D509 Iron deficiency anemia, unspecified: Secondary | ICD-10-CM | POA: Diagnosis not present

## 2020-01-18 DIAGNOSIS — E11621 Type 2 diabetes mellitus with foot ulcer: Secondary | ICD-10-CM | POA: Diagnosis not present

## 2020-01-18 DIAGNOSIS — I2510 Atherosclerotic heart disease of native coronary artery without angina pectoris: Secondary | ICD-10-CM | POA: Diagnosis not present

## 2020-01-18 DIAGNOSIS — S91301D Unspecified open wound, right foot, subsequent encounter: Secondary | ICD-10-CM | POA: Diagnosis not present

## 2020-01-18 DIAGNOSIS — L97412 Non-pressure chronic ulcer of right heel and midfoot with fat layer exposed: Secondary | ICD-10-CM | POA: Diagnosis not present

## 2020-01-18 DIAGNOSIS — E1140 Type 2 diabetes mellitus with diabetic neuropathy, unspecified: Secondary | ICD-10-CM | POA: Diagnosis not present

## 2020-01-18 DIAGNOSIS — E1151 Type 2 diabetes mellitus with diabetic peripheral angiopathy without gangrene: Secondary | ICD-10-CM | POA: Diagnosis not present

## 2020-01-19 DIAGNOSIS — N2581 Secondary hyperparathyroidism of renal origin: Secondary | ICD-10-CM | POA: Diagnosis not present

## 2020-01-19 DIAGNOSIS — D509 Iron deficiency anemia, unspecified: Secondary | ICD-10-CM | POA: Diagnosis not present

## 2020-01-19 DIAGNOSIS — E119 Type 2 diabetes mellitus without complications: Secondary | ICD-10-CM | POA: Diagnosis not present

## 2020-01-19 DIAGNOSIS — Z992 Dependence on renal dialysis: Secondary | ICD-10-CM | POA: Diagnosis not present

## 2020-01-19 DIAGNOSIS — N186 End stage renal disease: Secondary | ICD-10-CM | POA: Diagnosis not present

## 2020-01-21 DIAGNOSIS — Z992 Dependence on renal dialysis: Secondary | ICD-10-CM | POA: Diagnosis not present

## 2020-01-21 DIAGNOSIS — N186 End stage renal disease: Secondary | ICD-10-CM | POA: Diagnosis not present

## 2020-01-21 DIAGNOSIS — N2581 Secondary hyperparathyroidism of renal origin: Secondary | ICD-10-CM | POA: Diagnosis not present

## 2020-01-21 DIAGNOSIS — D509 Iron deficiency anemia, unspecified: Secondary | ICD-10-CM | POA: Diagnosis not present

## 2020-01-23 ENCOUNTER — Other Ambulatory Visit: Payer: Self-pay | Admitting: Internal Medicine

## 2020-01-24 ENCOUNTER — Encounter: Payer: Self-pay | Admitting: *Deleted

## 2020-01-24 DIAGNOSIS — Z992 Dependence on renal dialysis: Secondary | ICD-10-CM | POA: Diagnosis not present

## 2020-01-24 DIAGNOSIS — N2581 Secondary hyperparathyroidism of renal origin: Secondary | ICD-10-CM | POA: Diagnosis not present

## 2020-01-24 DIAGNOSIS — N186 End stage renal disease: Secondary | ICD-10-CM | POA: Diagnosis not present

## 2020-01-24 DIAGNOSIS — D509 Iron deficiency anemia, unspecified: Secondary | ICD-10-CM | POA: Diagnosis not present

## 2020-01-25 ENCOUNTER — Other Ambulatory Visit: Payer: Self-pay

## 2020-01-25 ENCOUNTER — Ambulatory Visit (INDEPENDENT_AMBULATORY_CARE_PROVIDER_SITE_OTHER): Payer: Medicare Other | Admitting: Podiatry

## 2020-01-25 DIAGNOSIS — I739 Peripheral vascular disease, unspecified: Secondary | ICD-10-CM

## 2020-01-25 DIAGNOSIS — E1149 Type 2 diabetes mellitus with other diabetic neurological complication: Secondary | ICD-10-CM | POA: Diagnosis not present

## 2020-01-25 DIAGNOSIS — L97411 Non-pressure chronic ulcer of right heel and midfoot limited to breakdown of skin: Secondary | ICD-10-CM

## 2020-01-25 MED ORDER — BD PEN NEEDLE NANO U/F 32G X 4 MM MISC
3 refills | Status: DC
Start: 2020-01-25 — End: 2020-01-27

## 2020-01-25 MED ORDER — HYDROCODONE-ACETAMINOPHEN 5-325 MG PO TABS
1.0000 | ORAL_TABLET | Freq: Four times a day (QID) | ORAL | 0 refills | Status: DC | PRN
Start: 2020-01-25 — End: 2020-03-27

## 2020-01-26 ENCOUNTER — Encounter: Payer: Self-pay | Admitting: Podiatry

## 2020-01-26 ENCOUNTER — Ambulatory Visit: Payer: Medicare Other | Admitting: Podiatry

## 2020-01-26 DIAGNOSIS — Z992 Dependence on renal dialysis: Secondary | ICD-10-CM | POA: Diagnosis not present

## 2020-01-26 DIAGNOSIS — N2581 Secondary hyperparathyroidism of renal origin: Secondary | ICD-10-CM | POA: Diagnosis not present

## 2020-01-26 DIAGNOSIS — N186 End stage renal disease: Secondary | ICD-10-CM | POA: Diagnosis not present

## 2020-01-26 DIAGNOSIS — D509 Iron deficiency anemia, unspecified: Secondary | ICD-10-CM | POA: Diagnosis not present

## 2020-01-26 NOTE — Progress Notes (Signed)
Subjective: 74 year old female presents the office today for follow-up evaluation of an ulcer to the right midfoot and right lateral foot.  She states overall she is doing well.  The home health has been changing her dressings.  The Betadine to the lateral side of the foot and gentamicin to the plantar midfoot wound she is scheduled to follow-up with the wound care center.  She denies any other acute complaints..  She has not seen any increase in swelling or redness. She denies any fevers, chills, nausea, vomiting.  No calf pain, chest pain, shortness of breath.  Objective: AAO x3, NAD DP/PT pulses decreased bilaterally, CRT less than 3 seconds On the right midfoot granular ulceration measuring smaller today 0.3 x 0.2 cm is more superficial with a granular wound base.  There is no surrounding erythema, ascending cellulitis but there is no fluctuation crepitation.  On the lateral aspect of the foot is a black eschar measuring 5 x 1.5 cm.  It is dried and there is no surrounding erythema, ascending cellulitis peer there is no drainage or pus any fluctuation crepitation. No pain with calf compression, swelling, warmth, erythema  Assessment: 74 year old female right foot ulceration with right lateral foot ulceration as well as right plantar midfoot ulceration  Plan: -All treatment options discussed with the patient including all alternatives, risks, complications.  -Sharply debrided the wound today and the plantar midfoot had a complications or bleeding I also sharply debrided some of the loose eschar to the lateral foot without any complications.  Continue gentamicin cream to the plantar midfoot and small amount of Betadine to the lateral foot.  Offloading. -She is scheduled to follow-up with the wound care center on the 27th. -Elevation -Encouraged to follow-up with vascular surgery. -I sharply debrided the wound on the plantar aspect with any complications to healthy, bleeding, viable tissue  utilizing the 312 with scalpel without any complications.  Continue with dressing changes continue with gentamicin cream plantar midfoot.  I would keep Betadine on the wound on the right foot to keep it clean and dry.  Continue elevation offloading. -She is follow-up with vascular surgery. -Should follow with the [name] in 2 weeks  No follow-ups on file.

## 2020-01-27 ENCOUNTER — Other Ambulatory Visit: Payer: Self-pay | Admitting: Internal Medicine

## 2020-01-27 DIAGNOSIS — S91301D Unspecified open wound, right foot, subsequent encounter: Secondary | ICD-10-CM | POA: Diagnosis not present

## 2020-01-27 DIAGNOSIS — E11621 Type 2 diabetes mellitus with foot ulcer: Secondary | ICD-10-CM | POA: Diagnosis not present

## 2020-01-27 DIAGNOSIS — E0822 Diabetes mellitus due to underlying condition with diabetic chronic kidney disease: Secondary | ICD-10-CM

## 2020-01-27 DIAGNOSIS — L97412 Non-pressure chronic ulcer of right heel and midfoot with fat layer exposed: Secondary | ICD-10-CM | POA: Diagnosis not present

## 2020-01-27 DIAGNOSIS — I2510 Atherosclerotic heart disease of native coronary artery without angina pectoris: Secondary | ICD-10-CM | POA: Diagnosis not present

## 2020-01-27 DIAGNOSIS — E1151 Type 2 diabetes mellitus with diabetic peripheral angiopathy without gangrene: Secondary | ICD-10-CM | POA: Diagnosis not present

## 2020-01-27 DIAGNOSIS — Z794 Long term (current) use of insulin: Secondary | ICD-10-CM

## 2020-01-27 DIAGNOSIS — E1140 Type 2 diabetes mellitus with diabetic neuropathy, unspecified: Secondary | ICD-10-CM | POA: Diagnosis not present

## 2020-01-27 MED ORDER — BD PEN NEEDLE NANO U/F 32G X 4 MM MISC: 3 refills | Status: DC

## 2020-01-28 DIAGNOSIS — N186 End stage renal disease: Secondary | ICD-10-CM | POA: Diagnosis not present

## 2020-01-28 DIAGNOSIS — Z992 Dependence on renal dialysis: Secondary | ICD-10-CM | POA: Diagnosis not present

## 2020-01-28 DIAGNOSIS — D509 Iron deficiency anemia, unspecified: Secondary | ICD-10-CM | POA: Diagnosis not present

## 2020-01-28 DIAGNOSIS — N2581 Secondary hyperparathyroidism of renal origin: Secondary | ICD-10-CM | POA: Diagnosis not present

## 2020-01-31 DIAGNOSIS — N2581 Secondary hyperparathyroidism of renal origin: Secondary | ICD-10-CM | POA: Diagnosis not present

## 2020-01-31 DIAGNOSIS — D509 Iron deficiency anemia, unspecified: Secondary | ICD-10-CM | POA: Diagnosis not present

## 2020-01-31 DIAGNOSIS — Z992 Dependence on renal dialysis: Secondary | ICD-10-CM | POA: Diagnosis not present

## 2020-01-31 DIAGNOSIS — N186 End stage renal disease: Secondary | ICD-10-CM | POA: Diagnosis not present

## 2020-02-01 ENCOUNTER — Other Ambulatory Visit: Payer: Self-pay

## 2020-02-01 ENCOUNTER — Encounter (HOSPITAL_BASED_OUTPATIENT_CLINIC_OR_DEPARTMENT_OTHER): Payer: Medicare Other | Attending: Physician Assistant | Admitting: Physician Assistant

## 2020-02-01 ENCOUNTER — Other Ambulatory Visit (HOSPITAL_COMMUNITY): Payer: Self-pay | Admitting: Physician Assistant

## 2020-02-01 ENCOUNTER — Ambulatory Visit (HOSPITAL_COMMUNITY)
Admission: RE | Admit: 2020-02-01 | Discharge: 2020-02-01 | Disposition: A | Discharge status: Home / Self Care | Payer: Medicare Other | Source: Ambulatory Visit | Attending: Physician Assistant | Admitting: Physician Assistant

## 2020-02-01 DIAGNOSIS — N186 End stage renal disease: Secondary | ICD-10-CM | POA: Diagnosis not present

## 2020-02-01 DIAGNOSIS — E1151 Type 2 diabetes mellitus with diabetic peripheral angiopathy without gangrene: Secondary | ICD-10-CM | POA: Diagnosis not present

## 2020-02-01 DIAGNOSIS — L97519 Non-pressure chronic ulcer of other part of right foot with unspecified severity: Secondary | ICD-10-CM | POA: Diagnosis not present

## 2020-02-01 DIAGNOSIS — Z951 Presence of aortocoronary bypass graft: Secondary | ICD-10-CM | POA: Insufficient documentation

## 2020-02-01 DIAGNOSIS — Z992 Dependence on renal dialysis: Secondary | ICD-10-CM | POA: Diagnosis not present

## 2020-02-01 DIAGNOSIS — E11621 Type 2 diabetes mellitus with foot ulcer: Secondary | ICD-10-CM | POA: Insufficient documentation

## 2020-02-01 DIAGNOSIS — E13621 Other specified diabetes mellitus with foot ulcer: Secondary | ICD-10-CM | POA: Insufficient documentation

## 2020-02-01 DIAGNOSIS — I890 Lymphedema, not elsewhere classified: Secondary | ICD-10-CM | POA: Insufficient documentation

## 2020-02-01 DIAGNOSIS — M2011 Hallux valgus (acquired), right foot: Secondary | ICD-10-CM | POA: Diagnosis not present

## 2020-02-01 DIAGNOSIS — I872 Venous insufficiency (chronic) (peripheral): Secondary | ICD-10-CM | POA: Diagnosis not present

## 2020-02-01 DIAGNOSIS — L97512 Non-pressure chronic ulcer of 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d: Secondary | ICD-10-CM | POA: Insufficient documentation

## 2020-02-01 DIAGNOSIS — I509 Heart failure, unspecified: Secondary | ICD-10-CM | POA: Diagnosis not present

## 2020-02-01 DIAGNOSIS — L97826 Non-pressure chronic ulcer of other part of left lower leg with bone involvement without evidence of necrosis: Secondary | ICD-10-CM | POA: Insufficient documentation

## 2020-02-01 DIAGNOSIS — I739 Peripheral vascular disease, unspecified: Secondary | ICD-10-CM | POA: Diagnosis not present

## 2020-02-01 DIAGNOSIS — Z872 Personal history of diseases of the skin and subcutaneous tissue: Secondary | ICD-10-CM | POA: Diagnosis not present

## 2020-02-01 DIAGNOSIS — M19071 Primary osteoarthritis, right ankle and foot: Secondary | ICD-10-CM | POA: Diagnosis not present

## 2020-02-01 DIAGNOSIS — I132 Hypertensive heart and chronic kidney disease with heart failure and with stage 5 chronic kidney disease, or end stage renal disease: Secondary | ICD-10-CM | POA: Diagnosis not present

## 2020-02-01 NOTE — Progress Notes (Signed)
[name] ([id number]) Visit Report for 02/01/2020 Chief Complaint Document Details Patient Name: Date of Service: [name], Pamela Pamela J. 02/01/2020 9:00 A M Medical Record Number: [id number] Patient Account Number: [id number] Date of Birth/Sex: Treating RN: 1945-12-20 (74 y.o. [name] Primary Care Provider: [name] Other Clinician: Referring Provider: Treating Provider/Extender: Azucena Kuba in Treatment: 0 Information Obtained from: Patient Chief Complaint Right plantar foot ulcer Electronic Signature(s) Signed: 02/01/2020 10:20:41 AM By: Worthy Keeler PA-C Entered By: Worthy Keeler on 02/01/2020 10:20:41 -------------------------------------------------------------------------------- Debridement Details Patient Name: Date of Service: Pamela Pilon, [name] Pamela J. 02/01/2020 9:00 A M Medical Record Number: [id number] Patient Account Number: [id number] Date of Birth/Sex: Treating RN: February 04, 1946 (74 y.o. [name] Primary Care Provider: [name] Other Clinician: Referring Provider: Treating Provider/Extender: Azucena Kuba in Treatment: 0 Debridement Performed for Assessment: Wound #17 Right,Plantar Foot Performed By: Physician Worthy Keeler, PA Debridement Type: Debridement Severity of Tissue Pre Debridement: Fat layer exposed Level of Consciousness (Pre-procedure): Awake and Alert Pre-procedure Verification/Time Out Yes - 10:30 Taken: Start Time: 10:30 Pain Control: Lidocaine 4% T opical Solution T Area Debrided (L x W): otal 1 (cm) x 1 (cm) = 1 (cm) Tissue and other material debrided: Viable, Non-Viable, Callus, Slough, Subcutaneous, Skin: Epidermis, Slough Level: Skin/Subcutaneous Tissue Debridement Description: Excisional Instrument: Curette Bleeding: Minimum Hemostasis Achieved: Pressure End Time: 13:35 Procedural Pain: 0 Post Procedural Pain: 0 Response to Treatment:  Procedure was tolerated well Level of Consciousness (Post- Awake and Alert procedure): Post Debridement Measurements of Total Wound Length: (cm) 0.4 Width: (cm) 0.5 Depth: (cm) 0.3 Volume: (cm) 0.047 Character of Wound/Ulcer Post Debridement: Improved Severity of Tissue Post Debridement: Fat layer exposed Post Procedure Diagnosis Same as Pre-procedure Electronic Signature(s) Signed: 02/01/2020 4:47:33 PM By: Worthy Keeler PA-C Signed: 02/01/2020 5:36:04 PM By: Baruch Gouty RN, BSN Entered By: Baruch Gouty on 02/01/2020 10:34:59 -------------------------------------------------------------------------------- Debridement Details Patient Name: Date of Service: Pamela Pilon, [name] Pamela J. 02/01/2020 9:00 A M Medical Record Number: [id number] Patient Account Number: [id number] Date of Birth/Sex: Treating RN: Nov 05, 1945 (74 y.o. [name], [name] Primary Care Provider: [name] Other Clinician: Referring Provider: Treating Provider/Extender: Azucena Kuba in Treatment: 0 Debridement Performed for Assessment: Wound #16 Right,Distal,Lateral Foot Performed By: Physician Worthy Keeler, PA Debridement Type: Debridement Severity of Tissue Pre Debridement: Fat layer exposed Level of Consciousness (Pre-procedure): Awake and Alert Pre-procedure Verification/Time Out Yes - 10:30 Taken: Start Time: 10:30 Pain Control: Lidocaine 4% T opical Solution T Area Debrided (L x W): otal 1 (cm) x 0.8 (cm) = 0.8 (cm) Tissue and other material debrided: Viable, Non-Viable, Eschar, Slough, Subcutaneous, Skin: Epidermis, Slough Level: Skin/Subcutaneous Tissue Debridement Description: Excisional Instrument: Curette Bleeding: Minimum Hemostasis Achieved: Pressure End Time: 13:35 Procedural Pain: 0 Post Procedural Pain: 0 Response to Treatment: Procedure was tolerated well Level of Consciousness (Post- Awake and Alert procedure): Post Debridement  Measurements of Total Wound Length: (cm) 1 Width: (cm) 0.8 Depth: (cm) 0.1 Volume: (cm) 0.063 Character of Wound/Ulcer Post Debridement: Requires Further Debridement Severity of Tissue Post Debridement: Fat layer exposed Post Procedure Diagnosis Same as Pre-procedure Electronic Signature(s) Signed: 02/01/2020 4:47:33 PM By: Worthy Keeler PA-C Signed: 02/01/2020 5:36:04 PM By: Baruch Gouty RN, BSN Entered By: Baruch Gouty on 02/01/2020 10:41:51 -------------------------------------------------------------------------------- Debridement Details Patient Name: Date of Service: Pamela Pilon, [name] Pamela J. 02/01/2020 9:00 A M Medical Record Number: [id number] Patient  Account Number: [id number] Date of Birth/Sex: Treating RN: March 03, 1946 (74 y.o. [name] Primary Care Provider: [name] Other Clinician: Referring Provider: Treating Provider/Extender: Azucena Kuba in Treatment: 0 Debridement Performed for Assessment: Wound #18 Right,Proximal,Lateral Foot Performed By: Physician Worthy Keeler, PA Debridement Type: Debridement Severity of Tissue Pre Debridement: Fat layer exposed Level of Consciousness (Pre-procedure): Awake and Alert Pre-procedure Verification/Time Out Yes - 10:30 Taken: Start Time: 10:30 Pain Control: Lidocaine 4% T opical Solution T Area Debrided (L x W): otal 4.6 (cm) x 1.4 (cm) = 6.44 (cm) Tissue and other material debrided: Viable, Non-Viable, Callus, Slough, Subcutaneous, Skin: Epidermis, Slough Level: Skin/Subcutaneous Tissue Debridement Description: Excisional Instrument: Curette Bleeding: Minimum Hemostasis Achieved: Pressure End Time: 13:43 Procedural Pain: 0 Post Procedural Pain: 0 Response to Treatment: Procedure was tolerated well Level of Consciousness (Post- Awake and Alert procedure): Post Debridement Measurements of Total Wound Length: (cm) 4.6 Width: (cm) 1.4 Depth: (cm) 0.3 Volume: (cm)  1.517 Character of Wound/Ulcer Post Debridement: Requires Further Debridement Severity of Tissue Post Debridement: Fat layer exposed Post Procedure Diagnosis Same as Pre-procedure Electronic Signature(s) Signed: 02/01/2020 4:47:33 PM By: Worthy Keeler PA-C Signed: 02/01/2020 5:36:04 PM By: Baruch Gouty RN, BSN Entered By: Baruch Gouty on 02/01/2020 10:42:56 -------------------------------------------------------------------------------- HPI Details Patient Name: Date of Service: Pamela Pilon, [name] Pamela J. 02/01/2020 9:00 A M Medical Record Number: [id number] Patient Account Number: [id number] Date of Birth/Sex: Treating RN: 1946-02-01 (74 y.o. [name] Primary Care Provider: [name] Other Clinician: Referring Provider: Treating Provider/Extender: Azucena Kuba in Treatment: 0 History of Present Illness HPI Description: 02/23/2019 on evaluation today patient presents for initial evaluation in our clinic concerning issues that she has been having with her bilateral lower extremities as well as her plantar foot on the right. This has been quite some time in regard to the foot ulcer even back as far as the beginning of this year that is 2020. She has had the wounds on her legs a much shorter time but that still has been roughly 2-3 months. Nonetheless she is really not shown signs of a lot of improvement. She tells me that she does have a past medical history of diabetes, venous insufficiency, lymphedema which is mild but nonetheless present, peripheral vascular disease and she has previously been recommended to have a femoral-popliteal bypass but put that off at that point they are basically just monitoring at this time. She has end-stage renal disease with dependence on renal dialysis, hypertension, and obviously wounds of the bilateral lower extremities at this point. She tells me that she is having some discomfort but fortunately nothing too  significant at this point which is good news. No fevers, chills, nausea, vomiting, or diarrhea. She is mainly been leaving these areas open to air as much as possible at this point at 1 point she was trying to keep them covered more but that really did not help either. Fortunately there is no signs of active systemic nor local infection at this time. 03/02/2019 on evaluation today patient appears to be doing well with regard to her wounds. The leg ulcers which were very dry are much more moist at this point and seem to be doing great at this time. Overall I am very pleased with how things seem to be progressing. With regard to her foot ulcer it is showing some signs of slight improvement in my opinion overall there does not appear to be anything  worse at least which is also good news. No fevers, chills, nausea, vomiting, or diarrhea. 03/09/2019 on evaluation today patient appears to be doing better with regard to her lower extremity ulcers a lot of the necrotic tissue is loosening up which is good news. In regard to her right plantar foot ulcer this appears to be roughly the same based on what I am seeing currently although there is less callus buildup. Unfortunately she does have an open wound area on the left lateral foot region which unfortunately probes down to bone once this was thoroughly examined. This is something she did not even know was opened she had had a Band-Aid over it just for protection last couple times I saw her. 03/16/19 evaluation today patient appears to be doing Someone poorly in regard to her left lower extremity. This seems to be likely infected based on what Im seeing at this point. Theres no signs of active infection at this time systemically but nonetheless theres a lot more erythema of the foot in general unfortunately. Her x-rays were reviewed and did not show any evidence of infection of the right foot. On the left there was a A little bit more going on but no obvious  evidence of osteomyelitis though it was mentioned that if that was still a concern that number I would be the best way to further evaluate this. I think they were going to need to proceed with MRI to be honest based on what Im seeing today. 03/23/2019 on evaluation today patient actually appears to be doing worse compared to even last week's visit with regard to her wounds especially the left foot but even the right lower extremity. Both are measuring deeper as far as the overall depth of the wound on the left foot this also seems to have spread as far as the open wound location. It also does appear to me that the cellulitis has spread further up her leg which also has me very concerned at this point. There fortunately is no signs of active infection at this point systemically such as evidence of sepsis but nonetheless I am still concerned about the fact that again the patient does not seem to be turning around. We did have her on IV vancomycin through dialysis which they graciously help this with as well. With that being said it appears based on her culture results that I did review today that cefepime or something of the like would likely be a better option the vancomycin is not likely to help with the Serratia species that was identified. 03/28/19 Fem-Pop 04/06/19 Amp toe 35month follow-up with vascular in april 05/18/2019 on evaluation today patient presents for reevaluation here in the clinic after having been sent to the ER by myself December 2020 due to overall worsening of her condition. She did have a femoral-popliteal bypass performed on 03/28/2019. 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 Subsequently she does have a 24-month follow-up with vascular in April but seems to be doing well from a vascular standpoint at this time. Overall the main issue for which she comes in today she does have a area on the plantar aspect of her  right foot where she does have a callus there may or may not be a wound under this region. Subsequently she does have several small ulcerations on the bilateral lower extremities that are more venous stasis in nature. Continue to these [name] need to be addressed today  as well. Fortunately there is no signs of active infection at this time. 05/25/2019 upon evaluation today patient appears to be doing well in regard to her lower extremities. Fortunately there is no signs of active infection at this time. No fever chills noted. She has been tolerating the dressing changes without complication. Fortunately she is doing much better in regard to her plantar foot in fact this appears to be completely healed which is excellent news. 06/01/2019 on evaluation today patient actually appears to be doing very well in regard to her wounds at this point. Fortunately there is no signs of active infection. No fevers, chills, nausea, vomiting, or diarrhea. 06/08/2019 upon evaluation today patient actually appears to be doing great in regard to her right lower extremity there are no open wounds remaining and she has done extremely well. Unfortunately she has several blistered areas that opened up on the left lower extremity in the interim between last week and this week. She has not been wearing any compression she states that I never told her anything about wearing compression. With that being said this is something that we have discussed in the past and it is something that we wanted her to [name] and again I did remind her of that today as well. 06/15/2019 upon evaluation today patient appears to be doing well with regard to her wound she does have some dressing material stuck to the surface of her wounds apparently they dried out and healed fairly quickly causing this to stick. Fortunately we should be able to clean this off but again that is one of the main issues that I see at this point. Fortunately there is no signs of  active infection at this time. No fevers, chills, nausea, vomiting, or diarrhea. 06/22/2019 upon evaluation today patient appears to be doing very well in regard to her wounds. She has been tolerating the dressing changes without complication. Fortunately there is no signs of active infection at this time. No fevers, chills, nausea, vomiting, or diarrhea. In fact the right leg appears to be completely healed she does have 1 pair of the compression socks with her 15-20 mmHg. Subsequently her left leg is doing much better although not completely healed she still has 1 area open at this point. 06/29/2019 on evaluation today patient appears to be doing excellent in regard to her left lower extremity. There is no signs of active infection at this time the wounds appear to be completely healed and overall she is doing excellent. Readmission: 02/01/2020 upon evaluation today patient appears to be doing somewhat poorly in regard to her right foot ulcer locations. She has 3 spots that are problematic at this time. Unfortunately she has been seeing podiatry but they have not been able to get them to improve. She does have poor blood flow here and I [name] believe that she really needs to go back and see vascular for further evaluation and recommendations in this regard. I also think an x-ray would be ideal considering the fact that that according to the patient has not been done as of yet. I also believe Santyl will likely be beneficial for her. She has had a bypass surgery on her leg on the left they were holding off on the right unless it became an absolute necessity due to the fact that unfortunately she also has had a heart bypass which has been somewhat problematic for further intervention in the right lower extremity. Electronic Signature(s) Signed: 02/01/2020 10:49:10 AM By: Worthy Keeler PA-C  Entered By: Worthy Keeler on 02/01/2020  10:49:10 -------------------------------------------------------------------------------- Paring/cutting 1 benign hyperkeratotic lesion Details Patient Name: Date of Service: [name], Pamela Pamela J. 02/01/2020 9:00 A M Medical Record Number: [id number] Patient Account Number: [id number] Date of Birth/Sex: Treating RN: Nov 30, 1945 (74 y.o. [name] Primary Care Provider: [name] Other Clinician: Referring Provider: Treating Provider/Extender: Azucena Kuba in Treatment: 0 Procedure Performed for: Non-Wound Location Performed By: Physician Worthy Keeler, PA Post Procedure Diagnosis Same as Pre-procedure Notes callous right calcaneus using #3 curette Electronic Signature(s) Signed: 02/01/2020 4:47:33 PM By: Worthy Keeler PA-C Signed: 02/01/2020 5:36:04 PM By: Baruch Gouty RN, BSN Entered By: Baruch Gouty on 02/01/2020 10:35:46 -------------------------------------------------------------------------------- Physical Exam Details Patient Name: Date of Service: Pamela Pilon, [name] Pamela J. 02/01/2020 9:00 A M Medical Record Number: [id number] Patient Account Number: [id number] Date of Birth/Sex: Treating RN: 1945/11/01 (74 y.o. [name] Primary Care Provider: [name] Other Clinician: Referring Provider: Treating Provider/Extender: Azucena Kuba in Treatment: 0 Constitutional patient is hypertensive.. pulse regular and within target range for patient.Marland Kitchen respirations regular, non-labored and within target range for patient.Marland Kitchen temperature within target range for patient.. Eyes conjunctiva clear no eyelid edema noted. pupils equal round and reactive to light and accommodation. Ears, Nose, Mouth, and Throat no gross abnormality of ear auricles or external auditory canals. normal hearing noted during conversation. mucus membranes moist. Respiratory normal breathing without  difficulty. Cardiovascular Absent posterior tibial and dorsalis pedis pulses bilateral lower extremities. no clubbing, cyanosis, significant edema, <3 sec cap refill. Musculoskeletal Patient unable to walk without assistance. no significant deformity or arthritic changes, no loss or range of motion, no clubbing. Psychiatric this patient is able to make decisions and demonstrates good insight into disease process. Alert and Oriented x 3. pleasant and cooperative. Notes Upon inspection patient's wound bed currently showed signs of eschar noted I did attempt debridement to clear away some of the eschar as best I could without being too aggressive than I did this very likely across the board. With that being said also removed some of her callus as well which she tolerated without complication. Fortunately on the heel there really was no open wound at this time which is good news. With that being said she is a high risk for amputation in my opinion just based on the fact that she has fairly significant wounds and really does not have a lot of blood flow into this right lower extremity at this point. Electronic Signature(s) Signed: 02/01/2020 10:50:16 AM By: Worthy Keeler PA-C Entered By: Worthy Keeler on 02/01/2020 10:50:15 -------------------------------------------------------------------------------- Physician Orders Details Patient Name: Date of Service: Pamela Pilon, [name] Pamela J. 02/01/2020 9:00 A M Medical Record Number: [id number] Patient Account Number: [id number] Date of Birth/Sex: Treating RN: May 03, 1945 (74 y.o. [name] Primary Care Provider: [name] Other Clinician: Referring Provider: Treating Provider/Extender: Azucena Kuba in Treatment: 0 Verbal / Phone Orders: No Diagnosis Coding ICD-10 Coding Code Description I87.2 Venous insufficiency (chronic) (peripheral) I89.0 Lymphedema, not elsewhere classified E11.621 Type 2 diabetes  mellitus with foot ulcer I73.89 Other specified peripheral vascular diseases L97.512 Non-pressure chronic ulcer of 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d L97.518 Non-pressure chronic ulcer of other part of right foot with other specified severity N18.6 End stage renal disease Z99.2 Dependence on renal dialysis I10 Essential (primary) hypertension Follow-up Appointments Return Appointment in 1 week. Dressing Change  Frequency Wound #16 Right,Distal,Lateral Foot Change dressing every day. Wound #17 Right,Plantar Foot Change dressing every day. Wound #18 Right,Proximal,Lateral Foot Change dressing every day. Skin Barriers/Peri-Wound Care Moisturizing lotion - to foot and leg with dressing changes Wound Cleansing May shower and wash wound with soap and water. - use dial antibacterial soap Primary Wound Dressing Wound #16 Right,Distal,Lateral Foot Santyl Ointment Wound #17 Right,Plantar Foot Silver Collagen Wound #18 Right,Proximal,Lateral Foot Santyl Ointment Secondary Dressing Wound #16 Right,Distal,Lateral Foot Kerlix/Rolled Gauze Dry Gauze Wound #17 Right,Plantar Foot Foam - foam donut Kerlix/Rolled Gauze Dry Gauze Wound #18 Right,Proximal,Lateral Foot Kerlix/Rolled Gauze Dry [facility] for wound care. - [place] Consults Vascular - follow up with Dr. Donnetta Hutching for critical limb ischemia and necrotic ulcers right foot Radiology X-ray, foot right complete view - diabetic foot ulcers right foot, R/O osteomyelitis - (ICD10 L97.512 - Non-pressure chronic ulcer of 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d) Patient Medications llergies: atenolol, Iodinated Contrast Media, iohexol, latex A Notifications Medication Indication Start End 02/01/2020 Santyl DOSE topical 250 unit/gram ointment - ointment topical Apply nickel thick daily to the wound bed and then cover with a dressing as directed in clinic x 30  days Electronic Signature(s) Signed: 02/01/2020 10:53:25 AM By: Worthy Keeler PA-C Entered By: Worthy Keeler on 02/01/2020 10:53:24 Prescription 02/01/2020 -------------------------------------------------------------------------------- Ronnald Nian PA Patient Name: Provider: November 20, 1945 [id number] Date of Birth: NPI#[id number] [id number] Sex: DEA #: [phone] Phone #: License #: [place] Patient Address: [facility] [address] [place], [place] [address] [place], [place] [address] [phone] Allergies atenolol; Iodinated Contrast Media; iohexol; latex Provider's Orders Vascular - follow up with Dr. Donnetta Hutching for critical limb ischemia and necrotic ulcers right foot Hand Signature: Date(s): Prescription 02/01/2020 Ronnald Nian PA Patient Name: Provider: 1946-02-01 [id number] Date of Birth: NPI#: [id number] Sex: DEA #: [phone] Phone #: License #: [place] Patient Address: [facility] [address] [place], [place] [address] [place], [place] [address] [phone] Allergies atenolol; Iodinated Contrast Media; iohexol; latex Provider's Orders X-ray, foot right complete view - ICD10: L97.512 - diabetic foot ulcers right foot, R/O osteomyelitis Hand Signature: Date(s): Electronic Signature(s) Signed: 02/01/2020 4:47:33 PM By: Worthy Keeler PA-C Entered By: Worthy Keeler on 02/01/2020 10:53:26 -------------------------------------------------------------------------------- Problem List Details Patient Name: Date of Service: Pamela Pilon, [name] Pamela J. 02/01/2020 9:00 A M Medical Record Number: [id number] Patient Account Number: [id number] Date of Birth/Sex: Treating RN: October 17, 1945 (74 y.o. [name], [name] Primary Care Provider: [name] Other Clinician: Referring Provider: Treating Provider/Extender:  Azucena Kuba in Treatment: 0 Active Problems ICD-10 Encounter Code Description Active Date MDM Diagnosis I87.2 Venous insufficiency (chronic) (peripheral) 02/01/2020 No Yes I89.0 Lymphedema, not elsewhere classified 02/01/2020 No Yes E11.621 Type 2 diabetes mellitus with foot ulcer 02/01/2020 No Yes I73.89 Other specified peripheral vascular diseases 02/01/2020 No Yes L97.512 Non-pressure chronic ulcer of 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d 02/01/2020 No Yes L97.518 Non-pressure chronic ulcer of other part of right foot with other specified 02/01/2020 No Yes severity N18.6 End stage renal disease 02/01/2020 No Yes Z99.2 Dependence on renal dialysis 02/01/2020 No Yes I10 Essential (primary) hypertension 02/01/2020 No Yes Inactive Problems Resolved Problems Electronic Signature(s) Signed: 02/01/2020 10:20:00 AM By: Worthy Keeler PA-C Entered By: Joaquim Lai  IIIMargarita Grizzle on 02/01/2020 10:20:00 -------------------------------------------------------------------------------- Progress Note Details Patient Name: Date of Service: [name], Pamela Pamela J. 02/01/2020 9:00 A M Medical Record Number: [id number] Patient Account Number: [id number] Date of Birth/Sex: Treating RN: 1945/07/12 (74 y.o. [name] Primary Care Provider: [name] Other Clinician: Referring Provider: Treating Provider/Extender: Azucena Kuba in Treatment: 0 Subjective Chief Complaint Information obtained from Patient Right plantar foot ulcer History of Present Illness (HPI) 02/23/2019 on evaluation today patient presents for initial evaluation in our clinic concerning issues that she has been having with her bilateral lower extremities as well as her plantar foot on the right. This has been quite some time in regard to the foot ulcer even back as far as the beginning of this year that is 2020. She has had the wounds on her legs a much shorter  time but that still has been roughly 2-3 months. Nonetheless she is really not shown signs of a lot of improvement. She tells me that she does have a past medical history of diabetes, venous insufficiency, lymphedema which is mild but nonetheless present, peripheral vascular disease and she has previously been recommended to have a femoral-popliteal bypass but put that off at that point they are basically just monitoring at this time. She has end-stage renal disease with dependence on renal dialysis, hypertension, and obviously wounds of the bilateral lower extremities at this point. She tells me that she is having some discomfort but fortunately nothing too significant at this point which is good news. No fevers, chills, nausea, vomiting, or diarrhea. She is mainly been leaving these areas open to air as much as possible at this point at 1 point she was trying to keep them covered more but that really did not help either. Fortunately there is no signs of active systemic nor local infection at this time. 03/02/2019 on evaluation today patient appears to be doing well with regard to her wounds. The leg ulcers which were very dry are much more moist at this point and seem to be doing great at this time. Overall I am very pleased with how things seem to be progressing. With regard to her foot ulcer it is showing some signs of slight improvement in my opinion overall there does not appear to be anything worse at least which is also good news. No fevers, chills, nausea, vomiting, or diarrhea. 03/09/2019 on evaluation today patient appears to be doing better with regard to her lower extremity ulcers a lot of the necrotic tissue is loosening up which is good news. In regard to her right plantar foot ulcer this appears to be roughly the same based on what I am seeing currently although there is less callus buildup. Unfortunately she does have an open wound area on the left lateral foot region which unfortunately  probes down to bone once this was thoroughly examined. This is something she did not even know was opened she had had a Band-Aid over it just for protection last couple times I saw her. 03/16/19 evaluation today patient appears to be doing Someone poorly in regard to her left lower extremity. This seems to be likely infected based on what Ioom seeing at this point. Thereoos no signs of active infection at this time systemically but nonetheless thereoos a lot more erythema of the foot in general unfortunately. Her x-rays were reviewed and did not show any evidence of infection of the right foot. On the left there was a A little bit  more going on but no obvious evidence of osteomyelitis though it was mentioned that if that was still a concern that number I would be the best way to further evaluate this. I think they were going to need to proceed with MRI to be honest based on what Ioom seeing today. 03/23/2019 on evaluation today patient actually appears to be doing worse compared to even last week's visit with regard to her wounds especially the left foot but even the right lower extremity. Both are measuring deeper as far as the overall depth of the wound on the left foot this also seems to have spread as far as the open wound location. It also does appear to me that the cellulitis has spread further up her leg which also has me very concerned at this point. There fortunately is no signs of active infection at this point systemically such as evidence of sepsis but nonetheless I am still concerned about the fact that again the patient does not seem to be turning around. We did have her on IV vancomycin through dialysis which they graciously help this with as well. With that being said it appears based on her culture results that I did review today that cefepime or something of the like would likely be a better option the vancomycin is not likely to help with the Serratia species that was  identified. 03/28/19 Fem-Pop 04/06/19 Amp toe 18month follow-up with vascular in april 05/18/2019 on evaluation today patient presents for reevaluation here in the clinic after having been sent to the ER by myself December 2020 due to overall worsening of her condition. She did have a femoral-popliteal bypass performed on 03/28/2019. 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 Subsequently she does have a 72-month follow-up with vascular in April but seems to be doing well from a vascular standpoint at this time. Overall the main issue for which she comes in today she does have a area on the plantar aspect of her right foot where she does have a callus there may or may not be a wound under this region. Subsequently she does have several small ulcerations on the bilateral lower extremities that are more venous stasis in nature. Continue to these [name] need to be addressed today as well. Fortunately there is no signs of active infection at this time. 05/25/2019 upon evaluation today patient appears to be doing well in regard to her lower extremities. Fortunately there is no signs of active infection at this time. No fever chills noted. She has been tolerating the dressing changes without complication. Fortunately she is doing much better in regard to her plantar foot in fact this appears to be completely healed which is excellent news. 06/01/2019 on evaluation today patient actually appears to be doing very well in regard to her wounds at this point. Fortunately there is no signs of active infection. No fevers, chills, nausea, vomiting, or diarrhea. 06/08/2019 upon evaluation today patient actually appears to be doing great in regard to her right lower extremity there are no open wounds remaining and she has done extremely well. Unfortunately she has several blistered areas that opened up on the left lower extremity in the interim between last week  and this week. She has not been wearing any compression she states that I never told her anything about wearing compression. With that being said this is something that we have discussed in the past and it is something that we  wanted her to [name] and again I did remind her of that today as well. 06/15/2019 upon evaluation today patient appears to be doing well with regard to her wound she does have some dressing material stuck to the surface of her wounds apparently they dried out and healed fairly quickly causing this to stick. Fortunately we should be able to clean this off but again that is one of the main issues that I see at this point. Fortunately there is no signs of active infection at this time. No fevers, chills, nausea, vomiting, or diarrhea. 06/22/2019 upon evaluation today patient appears to be doing very well in regard to her wounds. She has been tolerating the dressing changes without complication. Fortunately there is no signs of active infection at this time. No fevers, chills, nausea, vomiting, or diarrhea. In fact the right leg appears to be completely healed she does have 1 pair of the compression socks with her 15-20 mmHg. Subsequently her left leg is doing much better although not completely healed she still has 1 area open at this point. 06/29/2019 on evaluation today patient appears to be doing excellent in regard to her left lower extremity. There is no signs of active infection at this time the wounds appear to be completely healed and overall she is doing excellent. Readmission: 02/01/2020 upon evaluation today patient appears to be doing somewhat poorly in regard to her right foot ulcer locations. She has 3 spots that are problematic at this time. Unfortunately she has been seeing podiatry but they have not been able to get them to improve. She does have poor blood flow here and I [name] believe that she really needs to go back and see vascular for further evaluation and  recommendations in this regard. I also think an x-ray would be ideal considering the fact that that according to the patient has not been done as of yet. I also believe Santyl will likely be beneficial for her. She has had a bypass surgery on her leg on the left they were holding off on the right unless it became an absolute necessity due to the fact that unfortunately she also has had a heart bypass which has been somewhat problematic for further intervention in the right lower extremity. Patient History Information obtained from Patient. Allergies atenolol (Severity: Moderate, Reaction: rash, exacerbates gout), Iodinated Contrast Media (Severity: Moderate, Reaction: Rash), iohexol (Severity: Moderate, Reaction: rash), latex (Severity: Moderate, Reaction: rash) Family History Cancer - Father,Siblings, Diabetes - Maternal Grandparents,Paternal Grandparents,Mother,Father,Siblings, Heart Disease - Maternal Grandparents,Paternal Grandparents,Mother,Father,Siblings, Hypertension - Maternal Grandparents,Paternal Grandparents, Kidney Disease - Siblings, No family history of Hereditary Spherocytosis, Lung Disease, Seizures, Stroke, Thyroid Problems, Tuberculosis. Social History Never smoker, Marital Status - Married, Alcohol Use - Never, Drug Use - No History, Caffeine Use - Rarely. Medical History Eyes Patient has history of Cataracts - removed Hematologic/Lymphatic Patient has history of Anemia Respiratory Patient has history of Asthma Cardiovascular Patient has history of Congestive Heart Failure, Coronary Artery Disease, Hypertension, Peripheral Arterial Disease, Peripheral Venous Disease Endocrine Patient has history of Type II Diabetes Genitourinary Patient has history of End Stage Renal Disease - Hemodialysis 3x/week Integumentary (Skin) Denies history of History of Burn Musculoskeletal Patient has history of Osteoarthritis, Osteomyelitis - Left 5th toe Neurologic Patient has history  of Neuropathy Oncologic Patient has history of Received Radiation Medical A Surgical History Notes nd Cardiovascular Hyperlipidemia, Cardiomyopathy, s/p fem-pop in 12/20 Musculoskeletal 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 Oncologic Breast cancer 2014 with lumpectomy Review of Systems (ROS)  Constitutional Symptoms (General Health) Denies complaints or symptoms of Fatigue, Fever, Chills, Marked Weight Change. Ear/Nose/Mouth/Throat Denies complaints or symptoms of Chronic sinus problems or rhinitis. Gastrointestinal Denies complaints or symptoms of Frequent diarrhea, Nausea, Vomiting. Integumentary (Skin) Complains or has symptoms of Wounds - wounds on right foot. Psychiatric Denies complaints or symptoms of Claustrophobia, Suicidal. Objective Constitutional patient is hypertensive.. pulse regular and within target range for patient.Marland Kitchen respirations regular, non-labored and within target range for patient.Marland Kitchen temperature within target range for patient.. Vitals Time Taken: 9:24 AM, Height: 64 in, Source: Stated, Weight: 150 lbs, Source: Stated, BMI: 25.7, Temperature: 98.4 F, Pulse: 56 bpm, Respiratory Rate: 18 breaths/min, Blood Pressure: 151/80 mmHg, Capillary Blood Glucose: 94 mg/dl. General Notes: glucose per pt report Eyes conjunctiva clear no eyelid edema noted. pupils equal round and reactive to light and accommodation. Ears, Nose, Mouth, and Throat no gross abnormality of ear auricles or external auditory canals. normal hearing noted during conversation. mucus membranes moist. Respiratory normal breathing without difficulty. Cardiovascular Absent posterior tibial and dorsalis pedis pulses bilateral lower extremities. no clubbing, cyanosis, significant edema, Musculoskeletal Patient unable to walk without assistance. no significant deformity or arthritic changes, no loss or range of motion, no clubbing. Psychiatric this patient is able to make decisions and demonstrates  good insight into disease process. Alert and Oriented x 3. pleasant and cooperative. General Notes: Upon inspection patient's wound bed currently showed signs of eschar noted I did attempt debridement to clear away some of the eschar as best I could without being too aggressive than I did this very likely across the board. With that being said also removed some of her callus as well which she tolerated without complication. Fortunately on the heel there really was no open wound at this time which is good news. With that being said she is a high risk for amputation in my opinion just based on the fact that she has fairly significant wounds and really does not have a lot of blood flow into this right lower extremity at this point. Integumentary (Hair, Skin) Wound #16 status is Open. Original cause of wound was Gradually Appeared. The wound is located on the Right,Distal,Lateral Foot. The wound measures 1cm length x 0.8cm width x 0.1cm depth; 0.628cm^2 area and 0.063cm^3 volume. There is no tunneling or undermining noted. There is a small amount of serosanguineous drainage noted. The wound margin is flat and intact. There is no granulation within the wound bed. There is a large (67-100%) amount of necrotic tissue within the wound bed including Eschar. Wound #17 status is Open. Original cause of wound was Gradually Appeared. The wound is located on the [place]. The wound measures 0.2cm length x 0.3cm width x 0.4cm depth; 0.047cm^2 area and 0.019cm^3 volume. There is Fat Layer (Subcutaneous Tissue) exposed. There is no tunneling noted, however, there is undermining starting at 12:00 and ending at 12:00 with a maximum distance of 0.3cm. There is a small amount of serous drainage noted. The wound margin is flat and intact. There is large (67-100%) pink granulation within the wound bed. There is no necrotic tissue within the wound bed. Wound #18 status is Open. Original cause of wound was Gradually  Appeared. The wound is located on the Right,Proximal,Lateral Foot. The wound measures 4.6cm length x 1.4cm width x 0.3cm depth; 5.058cm^2 area and 1.517cm^3 volume. There is no tunneling or undermining noted. There is a medium amount of purulent drainage noted. The wound margin is flat and intact. There is small (1-33%) pink  granulation within the wound bed. There is a large (67-100%) amount of necrotic tissue within the wound bed including Eschar and Adherent Slough. 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 There was a Excisional Skin/Subcutaneous Tissue Debridement with a total area of 0.8 sq cm performed by Worthy Keeler, PA. With the following instrument(s): Curette to remove Viable and Non-Viable tissue/material. Material removed includes Eschar, Subcutaneous Tissue, Slough, and Skin: Epidermis after achieving pain control using Lidocaine 4% Topical Solution. A time out was conducted at 10:30, prior to the start of the procedure. A Minimum amount of bleeding was controlled with Pressure. The procedure was tolerated well with a pain level of 0 throughout and a pain level of 0 following the procedure. Post Debridement Measurements: 1cm length x 0.8cm width x 0.1cm depth; 0.063cm^3 volume. Character of Wound/Ulcer Post Debridement requires further debridement. Severity of Tissue Post  Debridement is: Fat layer exposed. Post procedure Diagnosis Wound #16: Same as Pre-Procedure Wound #17 Pre-procedure diagnosis of Wound #17 is a Diabetic Wound/Ulcer of the Lower Extremity located on the Right,Plantar Foot .Severity of Tissue Pre Debridement is: Fat layer exposed. There was a Excisional Skin/Subcutaneous Tissue Debridement with a total area of 1 sq cm performed by Worthy Keeler, PA. With the following instrument(s): Curette to remove Viable and Non-Viable tissue/material. Material removed includes Callus, Subcutaneous Tissue, Slough, and Skin: Epidermis after achieving pain control using Lidocaine 4% Topical Solution. A time out was conducted at 10:30, prior to the start of the procedure. A Minimum amount of bleeding was controlled with Pressure. The procedure was tolerated well with a pain level of 0 throughout and a pain level of 0 following the procedure. Post Debridement Measurements: 0.4cm length x 0.5cm width x 0.3cm depth; 0.047cm^3 volume. Character of Wound/Ulcer Post Debridement is improved. Severity of Tissue Post Debridement is: Fat layer exposed. Post procedure Diagnosis Wound #17: Same as Pre-Procedure Wound #18 Pre-procedure diagnosis of Wound #18 is a Diabetic Wound/Ulcer of the Lower Extremity located on the Right,Proximal,Lateral Foot .Severity of Tissue Pre Debridement is: Fat layer exposed. There was a Excisional Skin/Subcutaneous Tissue Debridement with a total area of 6.44 sq cm performed by Worthy Keeler, PA. With the following instrument(s): Curette to remove Viable and Non-Viable tissue/material. Material removed includes Callus, Subcutaneous Tissue, Slough, and Skin: Epidermis after achieving pain control using Lidocaine 4% Topical Solution. A time out was conducted at 10:30, prior to the start of the procedure. A Minimum amount of bleeding was controlled with Pressure. The procedure was tolerated well with a pain level of 0 throughout and a pain level  of 0 following the procedure. Post Debridement Measurements: 4.6cm length x 1.4cm width x 0.3cm depth; 1.517cm^3 volume. Character of Wound/Ulcer Post Debridement requires further debridement. Severity of Tissue Post Debridement is: Fat layer exposed. Post procedure Diagnosis Wound #18: Same as Pre-Procedure A Paring/cutting 1 benign hyperkeratotic lesion procedure was performed. by Worthy Keeler, PA. Post procedure Diagnosis Wound #: Same as Pre-Procedure Notes: callous right calcaneus using #3  curette Plan Follow-up Appointments: Return Appointment in 1 week. Dressing Change Frequency: Wound #16 Right,Distal,Lateral Foot: Change dressing every day. Wound #17 Right,Plantar Foot: Change dressing every day. Wound #18 Right,Proximal,Lateral Foot: Change dressing every day. Skin Barriers/Peri-Wound Care: Moisturizing lotion - to foot and leg with dressing changes Wound Cleansing: May shower and wash wound with soap and water. - use dial antibacterial soap Primary Wound Dressing: Wound #16 Right,Distal,Lateral Foot: Santyl Ointment Wound #17 Right,Plantar Foot: Silver Collagen Wound #18 Right,Proximal,Lateral Foot: Santyl Ointment Secondary Dressing: Wound #16 Right,Distal,Lateral Foot: Kerlix/Rolled Gauze Dry Gauze Wound #17 Right,Plantar Foot: Foam - foam donut Kerlix/Rolled Gauze Dry Gauze Wound #18 Right,Proximal,Lateral Foot: Kerlix/Rolled Gauze Dry Gauze Home Health: [place] skilled nursing for wound care. - [place] ordered were: Vascular - follow up with Dr. Donnetta Hutching for critical limb ischemia and necrotic ulcers right foot Radiology ordered were: X-ray, foot right complete view - diabetic foot ulcers right foot, R/O osteomyelitis The following medication(s) was prescribed: Santyl topical 250 unit/gram ointment ointment topical Apply nickel thick daily to the wound bed and then cover with a dressing as directed in clinic x 30 days  starting 02/01/2020 1. I would recommend currently that we go ahead and initiate treatment with Santyl for the patient I think this is good to be the best way to go and she is in agreement with that plan. 2. I am also can recommend an x-ray of the patient's foot to evaluate for any deeper/worsening issues here. 3. I am also can recommend referral back to Dr. Darene Lamer early for further consideration of bypass surgery here I think that that would be ideal for helping to heal odd the wounds and prevent new things from occurring in future but again a lot depends on her other health conditions obviously which I completely understand. We will see what Dr. Donnetta Hutching has to say in that regard. We will see patient back for reevaluation in 1 week here in the clinic. If anything worsens or changes patient will contact our office for additional recommendations. Electronic Signature(s) Signed: 02/01/2020 10:53:37 AM By: Worthy Keeler PA-C Entered By: Worthy Keeler on 02/01/2020 10:53:36 -------------------------------------------------------------------------------- HxROS Details Patient Name: Date of Service: Pamela Pilon, [name] Pamela J. 02/01/2020 9:00 A M Medical Record Number: [id number] Patient Account Number: [id number] Date of Birth/Sex: Treating RN: [birth year] (74 y.o. [name] Primary Care Provider: [name] Other Clinician: Referring Provider: Treating Provider/Extender: Azucena Kuba in Treatment: 0 Information Obtained From Patient Constitutional Symptoms (General Health) Complaints and Symptoms: Negative for: Fatigue; Fever; Chills; Marked Weight Change Ear/Nose/Mouth/Throat Complaints and Symptoms: Negative for: Chronic sinus problems or rhinitis Gastrointestinal Complaints and Symptoms: Negative for: Frequent diarrhea; Nausea; Vomiting Integumentary (Skin) Complaints and Symptoms: Positive for: Wounds - wounds on right foot Medical  History: Negative for: History of Burn Psychiatric Complaints and Symptoms: Negative for: Claustrophobia; Suicidal Eyes Medical History: Positive for: Cataracts - removed Hematologic/Lymphatic Medical History: Positive for: Anemia Respiratory Medical History: Positive for: Asthma Cardiovascular Medical History: Positive for: Congestive Heart Failure; Coronary Artery Disease; Hypertension; Peripheral Arterial Disease; Peripheral Venous Disease Past Medical History Notes: Hyperlipidemia, Cardiomyopathy, s/p fem-pop in 12/20 Endocrine Medical History: Positive for: Type II Diabetes Time with diabetes: since 2000 Treated with: Insulin Blood sugar tested every day: Yes Tested : 1-2x a day Genitourinary Medical History: Positive for: End Stage Renal Disease - Hemodialysis 3x/week Immunological Musculoskeletal Medical History: Positive for: Osteoarthritis; Osteomyelitis - Left 5th toe Past Medical History  Notes: 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 Neurologic Medical History: Positive for: Neuropathy Oncologic Medical History: Positive for: Received Radiation Past Medical History Notes: Breast cancer 2014 with lumpectomy HBO Extended History Items Eyes: Cataracts Immunizations Pneumococcal Vaccine: Received Pneumococcal Vaccination: Yes Tetanus Vaccine: Last tetanus shot: 01/13/2012 Implantable Devices None Family and Social History Cancer: Yes - Father,Siblings; Diabetes: Yes - Maternal Grandparents,Paternal Grandparents,Mother,Father,Siblings; Heart Disease: Yes - Maternal Grandparents,Paternal Grandparents,Mother,Father,Siblings; Hereditary Spherocytosis: No; Hypertension: Yes - Maternal Grandparents,Paternal Grandparents; Kidney Disease: Yes - Siblings; Lung Disease: No; Seizures: No; Stroke: No; Thyroid Problems: No; Tuberculosis: No; Never smoker; Marital Status - Married; Alcohol Use: Never; Drug Use: No History; Caffeine Use: Rarely; Financial Concerns: No;  Food, Clothing or Shelter Needs: No; Support System Lacking: No; Transportation Concerns: No Electronic Signature(s) Signed: 02/01/2020 4:47:33 PM By: Worthy Keeler PA-C Signed: 02/01/2020 5:38:30 PM By: [name] RN, BSN Entered By: [name] on 02/01/2020 09:32:18 -------------------------------------------------------------------------------- [place] Details Patient Name: Date of Service: Pamela Pilon, [name] Pamela J. 02/01/2020 Medical Record Number: [id number] Patient Account Number: [id number] Date of Birth/Sex: Treating RN: 13-Jun-1945 (74 y.o. 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 with other specified severity N18.6 End stage renal disease Z99.2 Dependence on renal dialysis I10 Essential (primary) hypertension Facility Procedures CPT4 Code: 36629476 Description: 99213 - WOUND CARE VISIT-LEV 3 EST PT Modifier: 25 Quantity: 1 CPT4 Code: 54650354 Description: 65681 - DEB SUBQ TISSUE 20 SQ CM/< ICD-10 Diagnosis Description L97.512 Non-pressure chronic ulcer of 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 L97.518 Non-pressure chronic ulcer of other part of right foot with other specified Modifier: d severity Quantity: 1 Physician Procedures : CPT4 Code Description Modifier [id number] 17494 - WC PHYS LEVEL 4 - EST PT 25 ICD-10 Diagnosis Description I87.2 Venous insufficiency (chronic) (peripheral) I89.0 Lymphedema, not elsewhere classified E11.621  Type 2 diabetes mellitus with foot ulcer I73.89  Other specified peripheral vascular diseases Quantity: 1 : [id number] 11042 - WC PHYS SUBQ TISS 20 SQ CM ICD-10 Diagnosis Description 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

## 2020-02-01 NOTE — Progress Notes (Signed)
JAZ, LANINGHAM ([id number]) Visit Report for 02/01/2020 Abuse/Suicide Risk Screen Details Patient Name: Date of Service: [name], Pamela NNA J. 02/01/2020 9:00 A M Medical Record Number: [id number] Patient Account Number: [id number] Date of Birth/Sex: Treating RN: 02-28-1946 (74 y.o. [name] Primary Care [name]: [name] Other Clinician: Referring [name]: Treating [name]/Extender: [name] in Treatment: 0 Abuse/Suicide Risk Screen Items Answer ABUSE RISK SCREEN: Has anyone close to you tried to hurt or harm you recentlyo No Do you feel uncomfortable with anyone in your familyo No Has anyone forced you do things that you didnt want to doo No Electronic Signature(s) Signed: 02/01/2020 5:38:30 PM By: [name] RN, BSN Entered By: [name] on 02/01/2020 09:32:25 -------------------------------------------------------------------------------- Activities of Daily Living Details Patient Name: Date of Service: Pamela Pilon, DO NNA J. 02/01/2020 9:00 A M Medical Record Number: [id number] Patient Account Number: [id number] Date of Birth/Sex: Treating RN: 1945/07/16 (74 y.o. [name] Primary Care [name]: [name] Other Clinician: Referring [name]: Treating [name]/Extender: [name] in Treatment: 0 Activities of Daily Living Items Answer Activities of Daily Living (Please select one for each item) Drive Automobile Not Able T Medications ake Completely Able Use T elephone Completely Able Care for Appearance Completely Able Use T oilet Completely Able Bath / Shower Completely Able Dress Self Completely Able Feed Self Completely Able Walk Need Assistance Get In / Out Bed Need Assistance Housework Need Assistance Prepare Meals Need Assistance Handle Money Completely Able Shop for Self Need Assistance Electronic Signature(s) Signed: 02/01/2020 5:38:30 PM By: [name] RN, BSN Entered By: [name] on 02/01/2020 09:32:51 -------------------------------------------------------------------------------- Education Screening Details Patient Name: Date of Service: Pamela Pilon, DO NNA J. 02/01/2020 9:00 A M Medical Record Number: [id number] Patient Account Number: [id number] Date of Birth/Sex: Treating RN: 14-Apr-1945 (74 y.o. [name] Primary Care [name]: [name] Other Clinician: Referring Raahil Ong: Treating [name]/Extender: [name] in Treatment: 0 Primary Learner Assessed: Patient Learning Preferences/Education Level/Primary Language Learning Preference: Explanation Highest Education Level: College or Above Preferred Language: English Cognitive Barrier Language Barrier: No Translator Needed: No Memory Deficit: No Emotional Barrier: No Cultural/Religious Beliefs Affecting Medical Care: No Physical Barrier Impaired Vision: No Impaired Hearing: No Decreased Hand dexterity: No Knowledge/Comprehension Knowledge Level: High Comprehension Level: High Ability to understand written instructions: High Ability to understand verbal instructions: High Motivation Anxiety Level: Calm Cooperation: Cooperative Education Importance: Acknowledges Need Interest in Health Problems: Asks Questions Perception: Coherent Willingness to Engage in Self-Management High Activities: Readiness to Engage in Self-Management High Activities: Electronic Signature(s) Signed: 02/01/2020 5:38:30 PM By: [name] RN, BSN Entered By: [name] on 02/01/2020 09:33:14 -------------------------------------------------------------------------------- Fall Risk Assessment Details Patient Name: Date of Service: Pamela Pilon, DO NNA J. 02/01/2020 9:00 A M Medical Record Number: [id number] Patient Account Number: [id number] Date of Birth/Sex: Treating RN: 1946-03-23 (74 y.o. [name] Primary Care  [name]: [name] Other Clinician: Referring [name]: Treating [name]/Extender: [name] in Treatment: 0 Fall Risk Assessment Items Have you had 2 or more falls in the last 12 monthso 0 No Have you had any fall that resulted in injury in the last 12 monthso 0 No FALLS RISK SCREEN History of falling - immediate or within 3 months 0 No Secondary diagnosis (Do you have 2 or more medical diagnoseso) 15 Yes Ambulatory aid None/bed rest/wheelchair/nurse 0 Yes Crutches/cane/walker 0 No Furniture 0  No Intravenous therapy Access/Saline/Heparin Lock 0 No Gait/Transferring Normal/ bed rest/ wheelchair 0 Yes Weak (short steps with or without shuffle, stooped but able to lift head while walking, may seek 10 Yes support from furniture) Impaired (short steps with shuffle, may have difficulty arising from chair, head down, impaired 0 No balance) Mental Status Oriented to own ability 0 Yes Electronic Signature(s) Signed: 02/01/2020 5:38:30 PM By: [name] RN, BSN Entered By: [name] on 02/01/2020 09:33:28 -------------------------------------------------------------------------------- Foot Assessment Details Patient Name: Date of Service: Pamela Pilon, DO NNA J. 02/01/2020 9:00 A M Medical Record Number: [id number] Patient Account Number: [id number] Date of Birth/Sex: Treating RN: January 15, 1946 (74 y.o. [name] Primary Care [name]: [name] Other Clinician: Referring [name]: Treating [name]/Extender: [name] in Treatment: 0 Foot Assessment Items Site Locations + = Sensation present, - = Sensation absent, C = Callus, U = Ulcer R = Redness, W = Warmth, M = Maceration, PU = Pre-ulcerative lesion F = Fissure, S = Swelling, D = Dryness Assessment Right: Left: Other Deformity: No No Prior Foot Ulcer: No No Prior Amputation: No No Charcot Joint: No No Ambulatory Status:  Ambulatory With Help Assistance Device: Cane Gait: Steady Electronic Signature(s) Signed: 02/01/2020 5:38:30 PM By: [name] RN, BSN Entered By: [name] on 02/01/2020 09:37:33 -------------------------------------------------------------------------------- Nutrition Risk Screening Details Patient Name: Date of Service: Pamela Pilon, DO NNA J. 02/01/2020 9:00 A M Medical Record Number: [id number] Patient Account Number: [id number] Date of Birth/Sex: Treating RN: Jun 24, 1945 (74 y.o. [name] Primary Care Jiaire Rosebrook: [name] Other Clinician: Referring Ellyse Rotolo: Treating Miria Cappelli/Extender: [name] in Treatment: 0 Height (in): 64 Weight (lbs): 150 Body Mass Index (BMI): 25.7 Nutrition Risk Screening Items Score Screening NUTRITION RISK SCREEN: I have an illness or condition that made me change the kind and/or amount of food I eat 2 Yes I eat fewer than two meals per day 0 No I eat few fruits and vegetables, or milk products 0 No I have three or more drinks of beer, liquor or wine almost every day 0 No I have tooth or mouth problems that make it hard for me to eat 0 No I don't always have enough money to buy the food I need 0 No I eat alone most of the time 0 No I take three or more different prescribed or over-the-counter drugs a day 1 Yes Without wanting to, I have lost or gained 10 pounds in the last six months 0 No I am not always physically able to shop, cook and/or feed myself 2 Yes Nutrition Protocols Good Risk Protocol Moderate Risk Protocol 0 Provide education on nutrition High Risk Proctocol Risk Level: Moderate Risk Score: 5 Electronic Signature(s) Signed: 02/01/2020 5:38:30 PM By: [name] RN, BSN Entered By: [name] on 02/01/2020 09:33:41

## 2020-02-01 NOTE — Progress Notes (Signed)
CORAL, TIMME ([id number]) Visit Report for 02/01/2020 Allergy List Details Patient Name: Date of Service: [name], Pamela NNA J. 02/01/2020 9:00 A M Medical Record Number: [id number] Patient Account Number: [id number] Date of Birth/Sex: Treating RN: 05-31-1945 (74 y.o. [name] Primary Care [name]: [name] Other Clinician: Referring [name]: Treating [name]/Extender: [name] Weeks in Treatment: 0 Allergies Active Allergies atenolol Reaction: rash, exacerbates gout Severity: Moderate Iodinated Contrast Media Reaction: Rash Severity: Moderate iohexol Reaction: rash Severity: Moderate latex Reaction: rash Severity: Moderate Allergy Notes Electronic Signature(s) Signed: 02/01/2020 5:38:30 PM By: Pamela Hurst RN, BSN Entered By: [name] on 02/01/2020 09:26:49 -------------------------------------------------------------------------------- Arrival Information Details Patient Name: Date of Service: Pamela Pilon, DO NNA J. 02/01/2020 9:00 A M Medical Record Number: [id number] Patient Account Number: [id number] Date of Birth/Sex: Treating RN: [birth year] (74 y.o. [name] Primary Care [name]: [name] Other Clinician: Referring [name]: Treating [name]/Extender: [name] in Treatment: 0 Visit Information Patient Arrived: Wheel Chair Arrival Time: 09:21 Accompanied By: alone Transfer Assistance: None Patient Identification Verified: Yes Secondary Verification Process Completed: Yes Patient Requires Transmission-Based Precautions: No Patient Has Alerts: Yes Patient Alerts: R ABI = [name] TBI = 0.39 L ABI = [name] TBI = 0.79 07/2019 Electronic Signature(s) Signed: 02/01/2020 5:38:30 PM By: Pamela Hurst RN, BSN Entered By: [name] on 10 History Since Last Visit Added or deleted any medications: No Any new allergies or adverse reactions: No Had a fall or  experienced change in activities of daily living that may affect risk of falls: No Pain Present Now: No -------------------------------------------------------------------------------- Lower Extremity Assessment Details Patient Name: Date of Service: [name], Pamela NNA J. 02/01/2020 9:00 A M Medical Record Number: [id number] Patient Account Number: [id number] Date of Birth/Sex: Treating RN: 1946-03-10 (74 y.o. [name] Primary Care [name]: [name] Other Clinician: Referring [name]: Treating [name]/Extender: [name] Weeks in Treatment: 0 Edema Assessment Assessed: [Left: No] [Right: No] Edema: [Left: N] [Right: o] Calf Left: Right: Point of Measurement: 43 cm From Medial Instep 28.4 cm Ankle Left: Right: Point of Measurement: 11 cm From Medial Instep 18 cm Vascular Assessment Pulses: Dorsalis Pedis Palpable: [Right:No] Electronic Signature(s) Signed: 02/01/2020 5:38:30 PM By: Pamela Hurst RN, BSN Entered By: [name] on 02/01/2020 09:43:40 -------------------------------------------------------------------------------- Pain Assessment Details Patient Name: Date of Service: Pamela Pilon, DO NNA J. 02/01/2020 9:00 A M Medical Record Number: [id number] Patient Account Number: [id number] Date of Birth/Sex: Treating RN: Aug 27, 1945 (74 y.o. [name] Primary Care [name]: [name] Other Clinician: Referring [name]: Treating [name]/Extender: [name] in Treatment: 0 Active Problems Location of Pain Severity and Description of Pain Patient Has Paino No Site Locations Pain Management and Medication Current Pain Management: Electronic Signature(s) Signed: 02/01/2020 5:38:30 PM By: Pamela Hurst RN, BSN Entered By: [name] on 02/01/2020 09:33:50 -------------------------------------------------------------------------------- Wound Assessment Details Patient  Name: Date of Service: Pamela Pilon, DO NNA J. 02/01/2020 9:00 A M Medical Record Number: [id number] Patient Account Number: [id number] Date of Birth/Sex: Treating RN: 1945-04-10 (74 y.o. [name] Primary Care [name]: [name] Other Clinician: Referring [name]: Treating [name]/Extender: [name] in Treatment: 0 Wound Status Wound Number: 16 Primary Diabetic Wound/Ulcer of the Lower Extremity Etiology: Wound Location: Right, Distal, Lateral Foot Wound Open Wounding Event: Gradually Appeared Status: Date Acquired: 11/06/2019 Comorbid Cataracts, Anemia, Asthma, Congestive Heart Failure, Coronary Weeks  Of Treatment: 0 History: Artery Disease, Hypertension, Peripheral Arterial Disease, Clustered Wound: No Peripheral Venous Disease, Type II Diabetes, End Stage Renal Disease, Osteoarthritis, Osteomyelitis, Neuropathy, Received Radiation Wound Measurements Length: (cm) 1 Width: (cm) 0.8 Depth: (cm) 0.1 Area: (cm) 0.628 Volume: (cm) 0.063 % Reduction in Area: % Reduction in Volume: Epithelialization: None Tunneling: No Undermining: No Wound Description Classification: Unable to visualize wound bed Wound Margin: Flat and Intact Exudate Amount: Small Exudate Type: Serosanguineous Exudate Color: red, brown Foul Odor After Cleansing: No Slough/Fibrino No Wound Bed Granulation Amount: None Present (0%) Exposed Structure Necrotic Amount: Large (67-100%) Fascia Exposed: No Necrotic Quality: Eschar Fat Layer (Subcutaneous Tissue) Exposed: No Tendon Exposed: No Muscle Exposed: No Joint Exposed: No Bone Exposed: No Electronic Signature(s) Signed: 02/01/2020 5:38:30 PM By: Pamela Hurst RN, BSN Entered By: [name] on 02/01/2020 09:44:49 -------------------------------------------------------------------------------- Wound Assessment Details Patient Name: Date of Service: Pamela Pilon, DO NNA J. 02/01/2020 9:00 A  M Medical Record Number: [id number] Patient Account Number: [id number] Date of Birth/Sex: Treating RN: Jul 12, 1945 (73 y.o. [name] Primary Care Storm Sovine: [name] Other Clinician: Referring Legrand Lasser: Treating Trayden Brandy/Extender: [name] in Treatment: 0 Wound Status Wound Number: 17 Primary Diabetic Wound/Ulcer of the Lower Extremity Etiology: Wound Location: Right, Plantar Foot Wound Open Wounding Event: Gradually Appeared Status: Date Acquired: 11/06/2018 Comorbid Cataracts, Anemia, Asthma, Congestive Heart Failure, Coronary Weeks Of Treatment: 0 History: Artery Disease, Hypertension, Peripheral Arterial Disease, Clustered Wound: No Peripheral Venous Disease, Type II Diabetes, End Stage Renal Disease, Osteoarthritis, Osteomyelitis, Neuropathy, Received Radiation Wound Measurements Length: (cm) 0.2 Width: (cm) 0.3 Depth: (cm) 0.4 Area: (cm) 0.047 Volume: (cm) 0.019 % Reduction in Area: 0% % Reduction in Volume: 0% Epithelialization: None Tunneling: No Undermining: Yes Starting Position (o'clock): 12 Ending Position (o'clock): 12 Maximum Distance: (cm) 0.3 Wound Description Classification: Grade 2 Wound Margin: Flat and Intact Exudate Amount: Small Exudate Type: Serous Exudate Color: amber Foul Odor After Cleansing: No Slough/Fibrino No Wound Bed Granulation Amount: Large (67-100%) Exposed Structure Granulation Quality: Pink Fascia Exposed: No Necrotic Amount: None Present (0%) Fat Layer (Subcutaneous Tissue) Exposed: Yes Tendon Exposed: No Muscle Exposed: No Joint Exposed: No Bone Exposed: No Electronic Signature(s) Signed: 02/01/2020 5:38:30 PM By: Pamela Hurst RN, BSN Entered By: [name] on 02/01/2020 09:46:21 -------------------------------------------------------------------------------- Wound Assessment Details Patient Name: Date of Service: Pamela Pilon, DO NNA J. 02/01/2020 9:00 A M Medical  Record Number: [id number] Patient Account Number: [id number] Date of Birth/Sex: Treating RN: October 04, 1945 (74 y.o. [name] Primary Care Filippa Yarbough: [name] Other Clinician: Referring Kemper Heupel: Treating Debbie Bellucci/Extender: [name] in Treatment: 0 Wound Status Wound Number: 18 Primary Diabetic Wound/Ulcer of the Lower Extremity Etiology: Wound Location: Right, Proximal, Lateral Foot Wound Open Wounding Event: Gradually Appeared Status: Date Acquired: 11/06/2018 Comorbid Cataracts, Anemia, Asthma, Congestive Heart Failure, Coronary Weeks Of Treatment: 0 History: Artery Disease, Hypertension, Peripheral Arterial Disease, Clustered Wound: No Peripheral Venous Disease, Type II Diabetes, End Stage Renal Disease, Osteoarthritis, Osteomyelitis, Neuropathy, Received Radiation Wound Measurements Length: (cm) 4.6 Width: (cm) 1.4 Depth: (cm) 0.3 Area: (cm) 5.058 Volume: (cm) 1.517 % Reduction in Area: % Reduction in Volume: Epithelialization: None Tunneling: No Undermining: No Wound Description Classification: Unable to visualize wound bed Wound Margin: Flat and Intact Exudate Amount: Medium Exudate Type: Purulent Exudate Color: yellow, brown, green Foul Odor After Cleansing: No Slough/Fibrino Yes Wound Bed Granulation Amount: Small (1-33%) Exposed Structure Granulation Quality: Pink Fascia Exposed: No Necrotic Amount: Large (  67-100%) Fat Layer (Subcutaneous Tissue) Exposed: No Necrotic Quality: Eschar, Adherent Slough Tendon Exposed: No Muscle Exposed: No Joint Exposed: No Bone Exposed: No Electronic Signature(s) Signed: 02/01/2020 5:38:30 PM By: Pamela Hurst RN, BSN Entered By: [name] on 02/01/2020 09:47:32 -------------------------------------------------------------------------------- [place] Details Patient Name: Date of Service: Pamela Pilon, DO NNA J. 02/01/2020 9:00 A M Medical Record Number:  [id number] Patient Account Number: [id number] Date of Birth/Sex: Treating RN: Jan 14, 1946 (74 y.o. [name] Primary Care Mercy Leppla: [name] Other Clinician: Referring Rashard Ryle: Treating Kelita Wallis/Extender: [name] in Treatment: 0 Vital Signs Time Taken: 09:24 Temperature (F): 98.4 Height (in): 64 Pulse (bpm): 56 Source: Stated Respiratory Rate (breaths/min): 18 Weight (lbs): 150 Blood Pressure (mmHg): 151/80 Source: Stated Capillary Blood Glucose (mg/dl): 94 Body Mass Index (BMI): 25.7 Reference Range: 80 - 120 mg / dl Notes glucose per pt report Electronic Signature(s) Signed: 02/01/2020 5:38:30 PM By: Pamela Hurst RN, BSN Entered By: [name] on 02/01/2020 01:75:10

## 2020-02-02 ENCOUNTER — Encounter: Payer: Medicare Other | Admitting: Internal Medicine

## 2020-02-02 DIAGNOSIS — D509 Iron deficiency anemia, unspecified: Secondary | ICD-10-CM | POA: Diagnosis not present

## 2020-02-02 DIAGNOSIS — N186 End stage renal disease: Secondary | ICD-10-CM | POA: Diagnosis not present

## 2020-02-02 DIAGNOSIS — Z992 Dependence on renal dialysis: Secondary | ICD-10-CM | POA: Diagnosis not present

## 2020-02-02 DIAGNOSIS — N2581 Secondary hyperparathyroidism of renal origin: Secondary | ICD-10-CM | POA: Diagnosis not present

## 2020-02-02 IMAGING — DX DG FOOT COMPLETE 3+V*R*
3 series · 3 of 3 positions shown · non-contrast
Comparison: 04/13/2018

CLINICAL DATA: Right plantar diabetic foot ulcer.

EXAM:
RIGHT FOOT COMPLETE - 3+ VIEW

[foot ap]
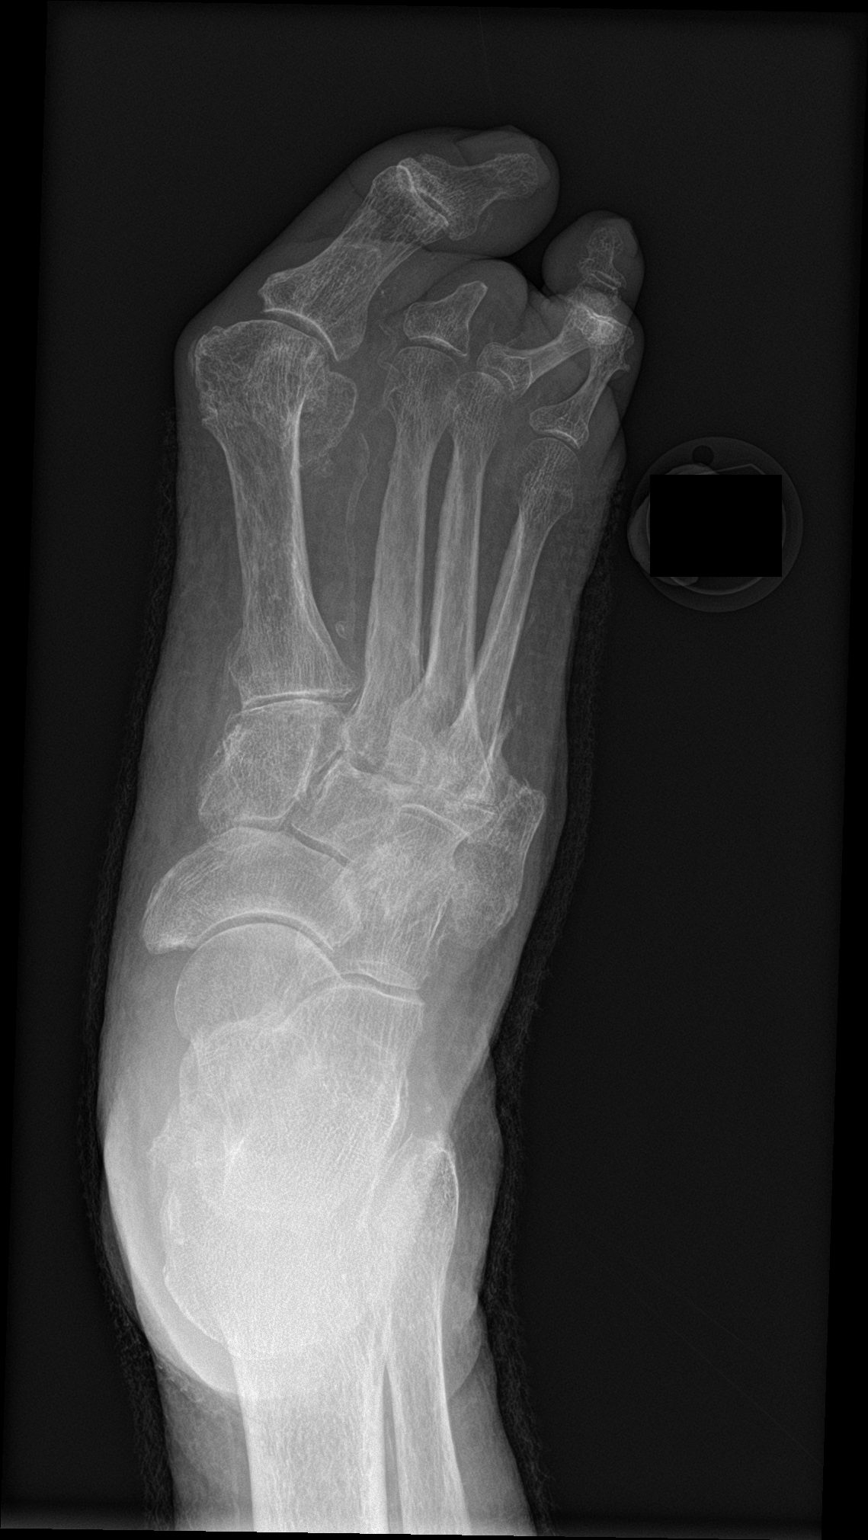

[foot obl]
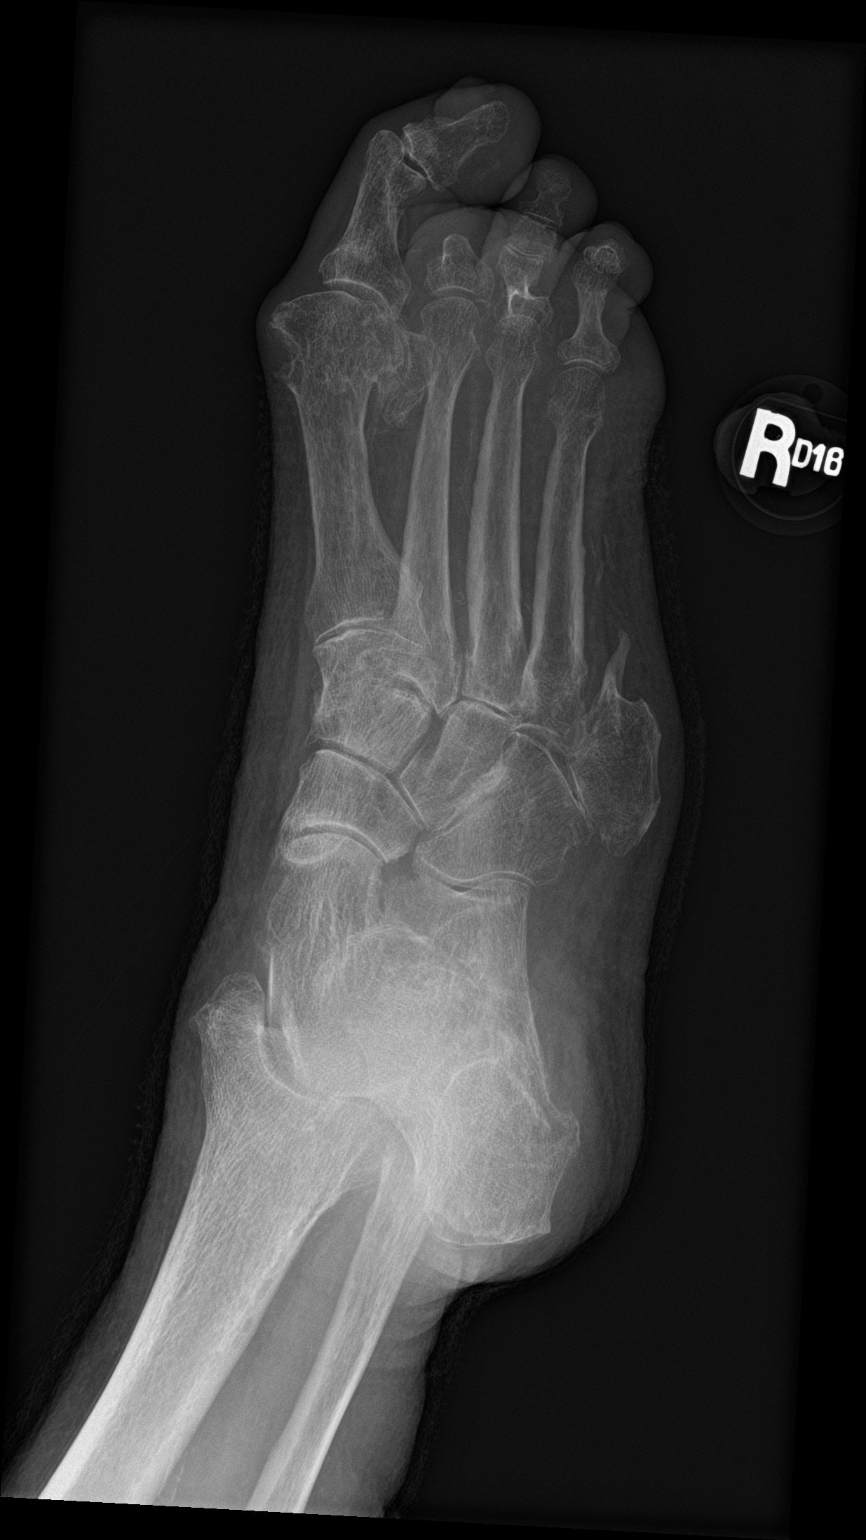

[foot lat]
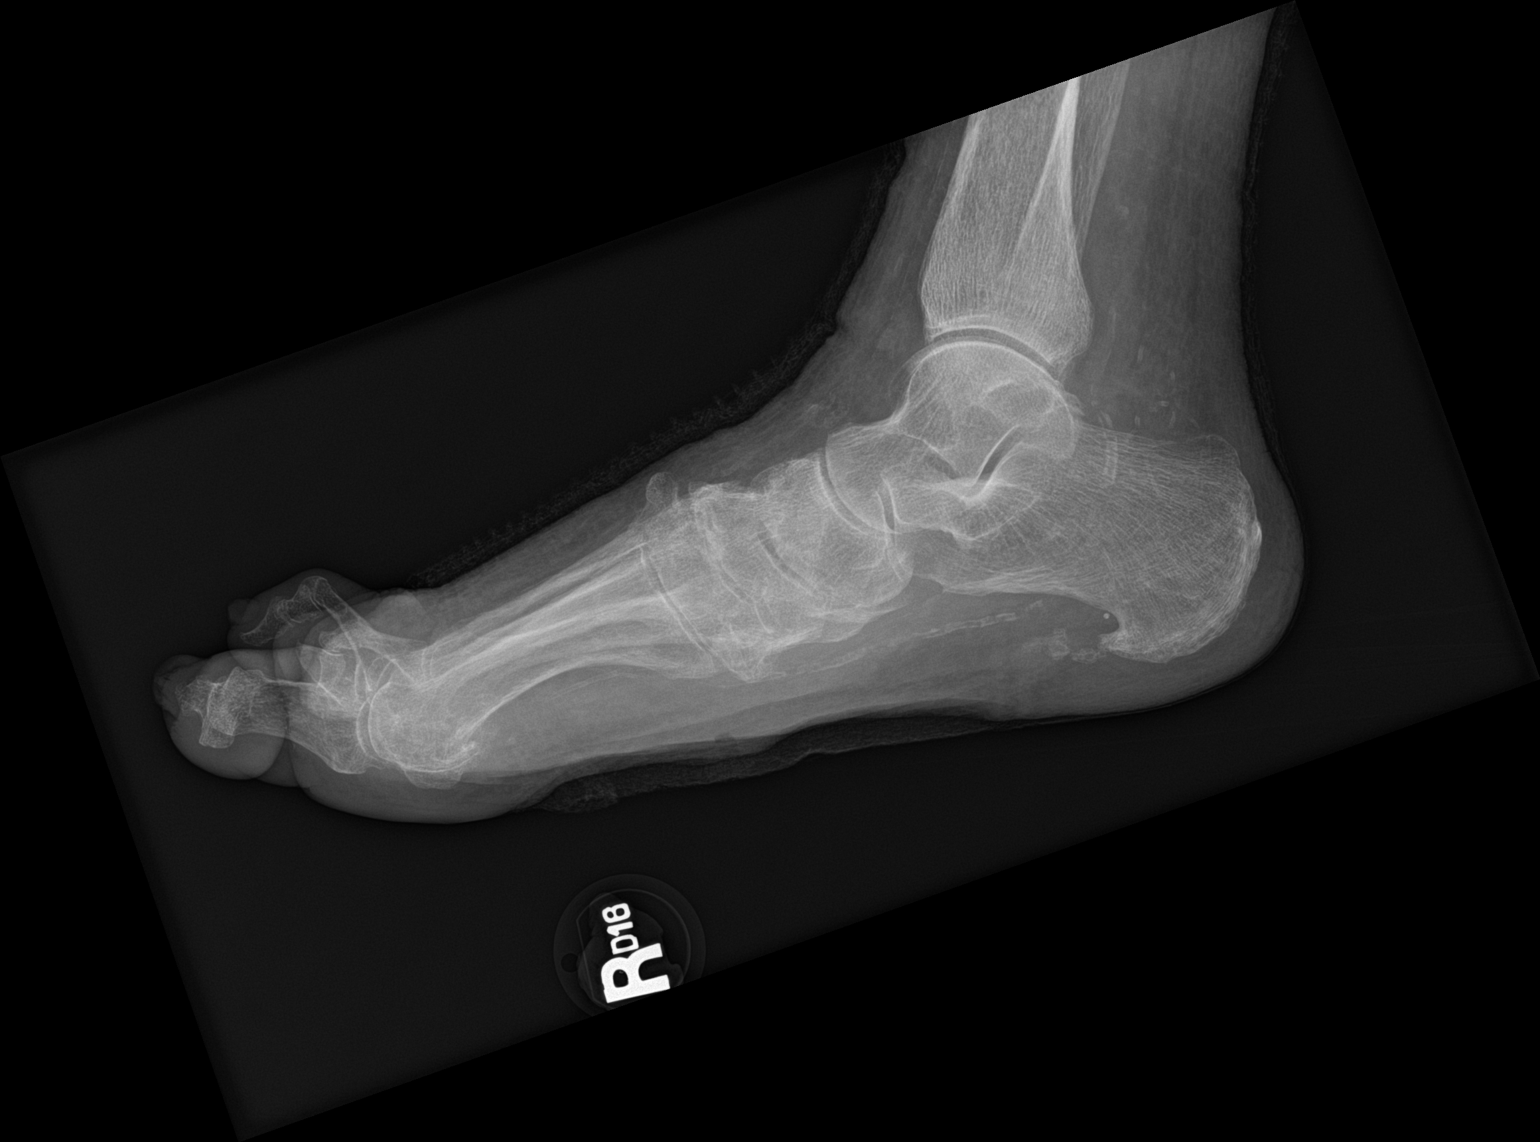

[3 of 3 positions shown; findings below may reference images not displayed]

FINDINGS: Stable postsurgical amputations second and fifth toes. Diffuse
osteopenia. Hallux valgus deformity. Degenerative change of the
first MTP joint as well as midfoot. Small inferior calcaneal spur.
No acute fracture or dislocation. No focal bone destruction to
suggest osteomyelitis. Tiny focus of air over the mid plantar soft
tissues likely representing patient's known plantar soft tissue
ulceration.
IMPRESSION: 1. Small focus of air over the mid plantar soft tissues likely
representing patient's known plantar soft tissue ulcer. No evidence
of underlying osteomyelitis.

2. Degenerative changes as described. Postsurgical amputations of
the second and fifth toes unchanged.

## 2020-02-03 DIAGNOSIS — L97412 Non-pressure chronic ulcer of right heel and midfoot with fat layer exposed: Secondary | ICD-10-CM | POA: Diagnosis not present

## 2020-02-03 DIAGNOSIS — I2510 Atherosclerotic heart disease of native coronary artery without angina pectoris: Secondary | ICD-10-CM | POA: Diagnosis not present

## 2020-02-03 DIAGNOSIS — E11621 Type 2 diabetes mellitus with foot ulcer: Secondary | ICD-10-CM | POA: Diagnosis not present

## 2020-02-03 DIAGNOSIS — E1140 Type 2 diabetes mellitus with diabetic neuropathy, unspecified: Secondary | ICD-10-CM | POA: Diagnosis not present

## 2020-02-03 DIAGNOSIS — S91301D Unspecified open wound, right foot, subsequent encounter: Secondary | ICD-10-CM | POA: Diagnosis not present

## 2020-02-03 DIAGNOSIS — E1151 Type 2 diabetes mellitus with diabetic peripheral angiopathy without gangrene: Secondary | ICD-10-CM | POA: Diagnosis not present

## 2020-02-04 DIAGNOSIS — N186 End stage renal disease: Secondary | ICD-10-CM | POA: Diagnosis not present

## 2020-02-04 DIAGNOSIS — N2581 Secondary hyperparathyroidism of renal origin: Secondary | ICD-10-CM | POA: Diagnosis not present

## 2020-02-04 DIAGNOSIS — D509 Iron deficiency anemia, unspecified: Secondary | ICD-10-CM | POA: Diagnosis not present

## 2020-02-04 DIAGNOSIS — Z992 Dependence on renal dialysis: Secondary | ICD-10-CM | POA: Diagnosis not present

## 2020-02-06 ENCOUNTER — Other Ambulatory Visit: Payer: Self-pay

## 2020-02-06 ENCOUNTER — Ambulatory Visit (INDEPENDENT_AMBULATORY_CARE_PROVIDER_SITE_OTHER): Payer: Medicare Other | Admitting: Podiatry

## 2020-02-06 DIAGNOSIS — N186 End stage renal disease: Secondary | ICD-10-CM | POA: Diagnosis not present

## 2020-02-06 DIAGNOSIS — M79675 Pain in left toe(s): Secondary | ICD-10-CM

## 2020-02-06 DIAGNOSIS — Z992 Dependence on renal dialysis: Secondary | ICD-10-CM | POA: Diagnosis not present

## 2020-02-06 DIAGNOSIS — B351 Tinea unguium: Secondary | ICD-10-CM | POA: Diagnosis not present

## 2020-02-06 DIAGNOSIS — M79674 Pain in right toe(s): Secondary | ICD-10-CM | POA: Diagnosis not present

## 2020-02-06 DIAGNOSIS — E1122 Type 2 diabetes mellitus with diabetic chronic kidney disease: Secondary | ICD-10-CM | POA: Diagnosis not present

## 2020-02-06 DIAGNOSIS — E1149 Type 2 diabetes mellitus with other diabetic neurological complication: Secondary | ICD-10-CM | POA: Diagnosis not present

## 2020-02-07 DIAGNOSIS — N2581 Secondary hyperparathyroidism of renal origin: Secondary | ICD-10-CM | POA: Diagnosis not present

## 2020-02-07 DIAGNOSIS — N186 End stage renal disease: Secondary | ICD-10-CM | POA: Diagnosis not present

## 2020-02-07 DIAGNOSIS — D509 Iron deficiency anemia, unspecified: Secondary | ICD-10-CM | POA: Diagnosis not present

## 2020-02-07 DIAGNOSIS — D631 Anemia in chronic kidney disease: Secondary | ICD-10-CM | POA: Diagnosis not present

## 2020-02-07 DIAGNOSIS — Z992 Dependence on renal dialysis: Secondary | ICD-10-CM | POA: Diagnosis not present

## 2020-02-08 ENCOUNTER — Other Ambulatory Visit: Payer: Self-pay

## 2020-02-08 ENCOUNTER — Encounter (HOSPITAL_BASED_OUTPATIENT_CLINIC_OR_DEPARTMENT_OTHER): Payer: Medicare Other | Attending: Physician Assistant | Admitting: Physician Assistant

## 2020-02-08 DIAGNOSIS — Z992 Dependence on renal dialysis: Secondary | ICD-10-CM | POA: Insufficient documentation

## 2020-02-08 DIAGNOSIS — L97518 Non-pressure chronic ulcer of other part of right foot with other specified severity: Secondary | ICD-10-CM | POA: Insufficient documentation

## 2020-02-08 DIAGNOSIS — E11621 Type 2 diabetes mellitus with foot ulcer: Secondary | ICD-10-CM | POA: Insufficient documentation

## 2020-02-08 DIAGNOSIS — E1136 Type 2 diabetes mellitus with diabetic cataract: Secondary | ICD-10-CM | POA: Diagnosis not present

## 2020-02-08 DIAGNOSIS — I132 Hypertensive heart and chronic kidney disease with heart failure and with stage 5 chronic kidney disease, or end stage renal disease: Secondary | ICD-10-CM | POA: Diagnosis not present

## 2020-02-08 DIAGNOSIS — E1122 Type 2 diabetes mellitus with diabetic chronic kidney disease: Secondary | ICD-10-CM | POA: Diagnosis not present

## 2020-02-08 DIAGNOSIS — I890 Lymphedema, not elsewhere classified: Secondary | ICD-10-CM | POA: Diagnosis not present

## 2020-02-08 DIAGNOSIS — E1140 Type 2 diabetes mellitus with diabetic neuropathy, unspecified: Secondary | ICD-10-CM | POA: Insufficient documentation

## 2020-02-08 DIAGNOSIS — Z951 Presence of aortocoronary bypass graft: Secondary | ICD-10-CM | POA: Insufficient documentation

## 2020-02-08 DIAGNOSIS — I872 Venous insufficiency (chronic) (peripheral): Secondary | ICD-10-CM | POA: Diagnosis not present

## 2020-02-08 DIAGNOSIS — N186 End stage renal disease: Secondary | ICD-10-CM | POA: Diagnosis not present

## 2020-02-08 DIAGNOSIS — I509 Heart failure, unspecified: Secondary | ICD-10-CM | POA: Diagnosis not present

## 2020-02-08 DIAGNOSIS — L97512 Non-pressure chronic ulcer of 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d: Secondary | ICD-10-CM | POA: Diagnosis not present

## 2020-02-08 DIAGNOSIS — E1151 Type 2 diabetes mellitus with diabetic peripheral angiopathy without gangrene: Secondary | ICD-10-CM | POA: Diagnosis not present

## 2020-02-09 ENCOUNTER — Other Ambulatory Visit: Payer: Self-pay

## 2020-02-09 DIAGNOSIS — D509 Iron deficiency anemia, unspecified: Secondary | ICD-10-CM | POA: Diagnosis not present

## 2020-02-09 DIAGNOSIS — I739 Peripheral vascular disease, unspecified: Secondary | ICD-10-CM

## 2020-02-09 DIAGNOSIS — D631 Anemia in chronic kidney disease: Secondary | ICD-10-CM | POA: Diagnosis not present

## 2020-02-09 DIAGNOSIS — N186 End stage renal disease: Secondary | ICD-10-CM | POA: Diagnosis not present

## 2020-02-09 DIAGNOSIS — Z992 Dependence on renal dialysis: Secondary | ICD-10-CM | POA: Diagnosis not present

## 2020-02-09 DIAGNOSIS — N2581 Secondary hyperparathyroidism of renal origin: Secondary | ICD-10-CM | POA: Diagnosis not present

## 2020-02-09 NOTE — Progress Notes (Addendum)
[name], [name] ([id number]) Visit Report for 02/08/2020 Chief Complaint Document Details Patient Name: Date of Service: [name], [place] NNA J. 02/08/2020 2:00 PM Medical Record Number: [id number] Patient Account Number: [id number] Date of Birth/Sex: Treating RN: 1946-02-11 (74 y.o. [name] Primary Care Provider: [name] Other Clinician: Referring Provider: Treating Provider/Extender: Azucena Kuba in Treatment: 1 Information Obtained from: Patient Chief Complaint Right plantar foot ulcer Electronic Signature(s) Signed: 02/08/2020 2:09:53 PM By: Worthy Keeler PA-C Entered By: Worthy Keeler on 02/08/2020 14:09:52 -------------------------------------------------------------------------------- Debridement Details Patient Name: Date of Service: [name], DO NNA J. 02/08/2020 2:00 PM Medical Record Number: [id number] Patient Account Number: [id number] Date of Birth/Sex: Treating RN: 07/31/1945 (74 y.o. [name] Primary Care Provider: [name] Other Clinician: Referring Provider: Treating Provider/Extender: Azucena Kuba in Treatment: 1 Debridement Performed for Assessment: Wound #16 Right,Distal,Lateral Foot Performed By: Clinician Carlene Coria, RN Debridement Type: Chemical/Enzymatic/Mechanical Agent Used: Santyl Severity of Tissue Pre Debridement: Fat layer exposed Level of Consciousness (Pre-procedure): Awake and Alert Pre-procedure Verification/Time Out Yes - 14:50 Taken: Start Time: 14:50 Bleeding: None Response to Treatment: Procedure was tolerated well Level of Consciousness (Post- Awake and Alert procedure): Post Debridement Measurements of Total Wound Length: (cm) 0.7 Width: (cm) 0.5 Depth: (cm) 0.1 Volume: (cm) 0.027 Character of Wound/Ulcer Post Debridement: Requires Further Debridement Severity of Tissue Post Debridement: Fat layer exposed Post Procedure  Diagnosis Same as Pre-procedure Electronic Signature(s) Signed: 02/08/2020 5:20:58 PM By: Worthy Keeler PA-C Signed: 02/08/2020 5:51:45 PM By: Baruch Gouty RN, BSN Signed: 02/08/2020 5:51:45 PM By: Baruch Gouty RN, BSN Entered By: Baruch Gouty on 02/08/2020 14:50:34 -------------------------------------------------------------------------------- Debridement Details Patient Name: Date of Service: [name], DO NNA J. 02/08/2020 2:00 PM Medical Record Number: [id number] Patient Account Number: [id number] Date of Birth/Sex: Treating RN: 1946-03-18 (74 y.o. [name] Primary Care Provider: [name] Other Clinician: Referring Provider: Treating Provider/Extender: Azucena Kuba in Treatment: 1 Debridement Performed for Assessment: Wound #18 Right,Proximal,Lateral Foot Performed By: Clinician Carlene Coria, RN Debridement Type: Chemical/Enzymatic/Mechanical Agent Used: Santyl Severity of Tissue Pre Debridement: Fat layer exposed Level of Consciousness (Pre-procedure): Awake and Alert Pre-procedure Verification/Time Out Yes - 14:50 Taken: Start Time: 14:50 Bleeding: None Response to Treatment: Procedure was tolerated well Level of Consciousness (Post- Awake and Alert procedure): Post Debridement Measurements of Total Wound Length: (cm) 4.6 Width: (cm) 1.4 Depth: (cm) 0.3 Volume: (cm) 1.517 Character of Wound/Ulcer Post Debridement: Requires Further Debridement Severity of Tissue Post Debridement: Fat layer exposed Post Procedure Diagnosis Same as Pre-procedure Electronic Signature(s) Signed: 02/08/2020 5:20:58 PM By: Worthy Keeler PA-C Signed: 02/08/2020 5:51:45 PM By: Baruch Gouty RN, BSN Entered By: Baruch Gouty on 02/08/2020 14:51:04 -------------------------------------------------------------------------------- HPI Details Patient Name: Date of Service: [name], DO NNA J. 02/08/2020 2:00 PM Medical Record Number:  [id number] Patient Account Number: [id number] Date of Birth/Sex: Treating RN: 12/15/1945 (74 y.o. [name] Primary Care Provider: [name] Other Clinician: Referring Provider: Treating Provider/Extender: Azucena Kuba in Treatment: 1 History of Present Illness HPI Description: 02/23/2019 on evaluation today patient presents for initial evaluation in our clinic concerning issues that she has been having with her bilateral lower extremities as well as her plantar foot on the right. This has been quite some time in regard to the foot ulcer even back as far as the beginning of this year that is 2020. She  has had the wounds on her legs a much shorter time but that still has been roughly 2-3 months. Nonetheless she is really not shown signs of a lot of improvement. She tells me that she does have a past medical history of diabetes, venous insufficiency, lymphedema which is mild but nonetheless present, peripheral vascular disease and she has previously been recommended to have a femoral-popliteal bypass but put that off at that point they are basically just monitoring at this time. She has end-stage renal disease with dependence on renal dialysis, hypertension, and obviously wounds of the bilateral lower extremities at this point. She tells me that she is having some discomfort but fortunately nothing too significant at this point which is good news. No fevers, chills, nausea, vomiting, or diarrhea. She is mainly been leaving these areas open to air as much as possible at this point at 1 point she was trying to keep them covered more but that really did not help either. Fortunately there is no signs of active systemic nor local infection at this time. 03/02/2019 on evaluation today patient appears to be doing well with regard to her wounds. The leg ulcers which were very dry are much more moist at this point and seem to be doing great at this time. Overall I  am very pleased with how things seem to be progressing. With regard to her foot ulcer it is showing some signs of slight improvement in my opinion overall there does not appear to be anything worse at least which is also good news. No fevers, chills, nausea, vomiting, or diarrhea. 03/09/2019 on evaluation today patient appears to be doing better with regard to her lower extremity ulcers a lot of the necrotic tissue is loosening up which is good news. In regard to her right plantar foot ulcer this appears to be roughly the same based on what I am seeing currently although there is less callus buildup. Unfortunately she does have an open wound area on the left lateral foot region which unfortunately probes down to bone once this was thoroughly examined. This is something she did not even know was opened she had had a Band-Aid over it just for protection last couple times I saw her. 03/16/19 evaluation today patient appears to be doing Someone poorly in regard to her left lower extremity. This seems to be likely infected based on what Im seeing at this point. Theres no signs of active infection at this time systemically but nonetheless theres a lot more erythema of the foot in general unfortunately. Her x-rays were reviewed and did not show any evidence of infection of the right foot. On the left there was a A little bit more going on but no obvious evidence of osteomyelitis though it was mentioned that if that was still a concern that number I would be the best way to further evaluate this. I think they were going to need to proceed with MRI to be honest based on what Im seeing today. 03/23/2019 on evaluation today patient actually appears to be doing worse compared to even last week's visit with regard to her wounds especially the left foot but even the right lower extremity. Both are measuring deeper as far as the overall depth of the wound on the left foot this also seems to have spread as far as the  open wound location. It also does appear to me that the cellulitis has spread further up her leg which also has me very concerned at this point. There  fortunately is no signs of active infection at this point systemically such as evidence of sepsis but nonetheless I am still concerned about the fact that again the patient does not seem to be turning around. We did have her on IV vancomycin through dialysis which they graciously help this with as well. With that being said it appears based on her culture results that I did review today that cefepime or something of the like would likely be a better option the vancomycin is not likely to help with the Serratia species that was identified. 03/28/19 Fem-Pop 04/06/19 Amp toe 63month follow-up with vascular in april 05/18/2019 on evaluation today patient presents for reevaluation here in the clinic after having been sent to the ER by myself December 2020 due to overall worsening of her condition. She did have a femoral-popliteal bypass performed on 03/28/2019. 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 Subsequently she does have a 35-month follow-up with vascular in April but seems to be doing well from a vascular standpoint at this time. Overall the main issue for which she comes in today she does have a area on the plantar aspect of her right foot where she does have a callus there may or may not be a wound under this region. Subsequently she does have several small ulcerations on the bilateral lower extremities that are more venous stasis in nature. Continue to these do need to be addressed today as well. Fortunately there is no signs of active infection at this time. 05/25/2019 upon evaluation today patient appears to be doing well in regard to her lower extremities. Fortunately there is no signs of active infection at this time. No fever chills noted. She has been tolerating the dressing  changes without complication. Fortunately she is doing much better in regard to her plantar foot in fact this appears to be completely healed which is excellent news. 06/01/2019 on evaluation today patient actually appears to be doing very well in regard to her wounds at this point. Fortunately there is no signs of active infection. No fevers, chills, nausea, vomiting, or diarrhea. 06/08/2019 upon evaluation today patient actually appears to be doing great in regard to her right lower extremity there are no open wounds remaining and she has done extremely well. Unfortunately she has several blistered areas that opened up on the left lower extremity in the interim between last week and this week. She has not been wearing any compression she states that I never told her anything about wearing compression. With that being said this is something that we have discussed in the past and it is something that we wanted her to do and again I did remind her of that today as well. 06/15/2019 upon evaluation today patient appears to be doing well with regard to her wound she does have some dressing material stuck to the surface of her wounds apparently they dried out and healed fairly quickly causing this to stick. Fortunately we should be able to clean this off but again that is one of the main issues that I see at this point. Fortunately there is no signs of active infection at this time. No fevers, chills, nausea, vomiting, or diarrhea. 06/22/2019 upon evaluation today patient appears to be doing very well in regard to her wounds. She has been tolerating the dressing changes without complication. Fortunately there is no signs of active infection at this time. No fevers, chills, nausea, vomiting, or diarrhea.  In fact the right leg appears to be completely healed she does have 1 pair of the compression socks with her 15-20 mmHg. Subsequently her left leg is doing much better although not completely healed she still has  1 area open at this point. 06/29/2019 on evaluation today patient appears to be doing excellent in regard to her left lower extremity. There is no signs of active infection at this time the wounds appear to be completely healed and overall she is doing excellent. Readmission: 02/01/2020 upon evaluation today patient appears to be doing somewhat poorly in regard to her right foot ulcer locations. She has 3 spots that are problematic at this time. Unfortunately she has been seeing podiatry but they have not been able to get them to improve. She does have poor blood flow here and I do believe that she really needs to go back and see vascular for further evaluation and recommendations in this regard. I also think an x-ray would be ideal considering the fact that that according to the patient has not been done as of yet. I also believe Santyl will likely be beneficial for her. She has had a bypass surgery on her leg on the left they were holding off on the right unless it became an absolute necessity due to the fact that unfortunately she also has had a heart bypass which has been somewhat problematic for further intervention in the right lower extremity. 02/08/2020 upon evaluation today patient appears to be doing well with regard to her foot all things considered at each wound location. With that being said the x- ray was negative for any signs of obvious osteomyelitis. Nonetheless she is still awaiting the ultrasound and visit with Dr. Donnetta Hutching due to critical limb ischemia. Hopefully that will be coming up in the next week which the patient tells me is so. Electronic Signature(s) Signed: 02/08/2020 2:48:53 PM By: Worthy Keeler PA-C Entered By: Worthy Keeler on 02/08/2020 14:48:52 -------------------------------------------------------------------------------- Physical Exam Details Patient Name: Date of Service: [name], DO NNA J. 02/08/2020 2:00 PM Medical Record Number: [id number] Patient Account  Number: [id number] Date of Birth/Sex: Treating RN: 1945-08-25 (74 y.o. [name] Primary Care Provider: [name] Other Clinician: Referring Provider: Treating Provider/Extender: Azucena Kuba in Treatment: 1 Constitutional Well-nourished and well-hydrated in no acute distress. Respiratory normal breathing without difficulty. Psychiatric this patient is able to make decisions and demonstrates good insight into disease process. Alert and Oriented x 3. pleasant and cooperative. Notes Upon inspection today patient still has necrotic tissue noted but again based on the blood flow issues that we have going on I am not to perform any sharp debridement at this point. We will continue to keep a close eye on this for example do his job on the lateral portion of her foot. Electronic Signature(s) Signed: 02/08/2020 2:49:32 PM By: Worthy Keeler PA-C Previous Signature: 02/08/2020 2:49:09 PM Version By: Worthy Keeler PA-C Entered By: Worthy Keeler on 02/08/2020 14:49:31 -------------------------------------------------------------------------------- Physician Orders Details Patient Name: Date of Service: [name], DO NNA J. 02/08/2020 2:00 PM Medical Record Number: [id number] Patient Account Number: [id number] Date of Birth/Sex: Treating RN: 1945-08-18 (74 y.o. [name] Primary Care Provider: [name] Other Clinician: Referring Provider: Treating Provider/Extender: Azucena Kuba in Treatment: 1 Verbal / Phone Orders: No Diagnosis Coding ICD-10 Coding Code Description I87.2 Venous insufficiency (chronic) (peripheral) I89.0 Lymphedema, not elsewhere classified E11.621 Type 2 diabetes  mellitus with foot ulcer I73.89 Other specified peripheral vascular diseases L97.512 Non-pressure chronic ulcer of 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d L97.518 Non-pressure chronic ulcer of other part of right  foot with other specified severity N18.6 End stage renal disease Z99.2 Dependence on renal dialysis I10 Essential (primary) hypertension Follow-up Appointments Return Appointment in 2 weeks. Dressing Change Frequency Wound #16 Right,Distal,Lateral Foot Change dressing every day. Wound #17 Right,Plantar Foot Change dressing every day. Wound #18 Right,Proximal,Lateral Foot Change dressing every day. Skin Barriers/Peri-Wound Care Moisturizing lotion - to foot and leg with dressing changes Wound Cleansing May shower and wash wound with soap and water. - use dial antibacterial soap Primary Wound Dressing Wound #16 Right,Distal,Lateral Foot Santyl Ointment Wound #17 Right,Plantar Foot Silver Collagen Wound #18 Right,Proximal,Lateral Foot Santyl Ointment Secondary Dressing Wound #16 Right,Distal,Lateral Foot Kerlix/Rolled Gauze Dry Gauze Wound #17 Right,Plantar Foot Foam - foam donut Kerlix/Rolled Gauze Dry Gauze Wound #18 Right,Proximal,Lateral Foot Kerlix/Rolled Gauze Dry [facility] for wound care. - [place] Signature(s) Signed: 02/08/2020 5:20:58 PM By: Worthy Keeler PA-C Signed: 02/08/2020 5:51:45 PM By: Baruch Gouty RN, BSN Entered By: Baruch Gouty on 02/08/2020 14:52:01 -------------------------------------------------------------------------------- Problem List Details Patient Name: Date of Service: [name], DO NNA J. 02/08/2020 2:00 PM Medical Record Number: [id number] Patient Account Number: [id number] Date of Birth/Sex: Treating RN: 09/29/1945 (74 y.o. [name], [place] Primary Care Provider: [name] Other Clinician: Referring Provider: Treating Provider/Extender: Azucena Kuba in Treatment: 1 Active Problems ICD-10 Encounter Code Description Active Date MDM Diagnosis I87.2 Venous insufficiency (chronic) (peripheral) 02/01/2020 No Yes I89.0  Lymphedema, not elsewhere classified 02/01/2020 No Yes E11.621 Type 2 diabetes mellitus with foot ulcer 02/01/2020 No Yes I73.89 Other specified peripheral vascular diseases 02/01/2020 No Yes L97.512 Non-pressure chronic ulcer of 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d 02/01/2020 No Yes L97.518 Non-pressure chronic ulcer of other part of right foot with other specified 02/01/2020 No Yes severity N18.6 End stage renal disease 02/01/2020 No Yes Z99.2 Dependence on renal dialysis 02/01/2020 No Yes I10 Essential (primary) hypertension 02/01/2020 No Yes Inactive Problems Resolved Problems Electronic Signature(s) Signed: 02/08/2020 2:09:01 PM By: Worthy Keeler PA-C Entered By: Worthy Keeler on 02/08/2020 14:09:00 -------------------------------------------------------------------------------- Progress Note Details Patient Name: Date of Service: [name], DO NNA J. 02/08/2020 2:00 PM Medical Record Number: [id number] Patient Account Number: [id number] Date of Birth/Sex: Treating RN: Mar 26, 1946 (74 y.o. [name] Primary Care Provider: [name] Other Clinician: Referring Provider: Treating Provider/Extender: Azucena Kuba in Treatment: 1 Subjective Chief Complaint Information obtained from Patient Right plantar foot ulcer History of Present Illness (HPI) 02/23/2019 on evaluation today patient presents for initial evaluation in our clinic concerning issues that she has been having with her bilateral lower extremities as well as her plantar foot on the right. This has been quite some time in regard to the foot ulcer even back as far as the beginning of this year that is 2020. She has had the wounds on her legs a much shorter time but that still has been roughly 2-3 months. Nonetheless she is really not shown signs of a lot of improvement. She tells me that she does have a past medical history of diabetes, venous insufficiency, lymphedema  which is mild but nonetheless present, peripheral vascular disease and she has previously been recommended to have a femoral-popliteal bypass but put that off at that  point they are basically just monitoring at this time. She has end-stage renal disease with dependence on renal dialysis, hypertension, and obviously wounds of the bilateral lower extremities at this point. She tells me that she is having some discomfort but fortunately nothing too significant at this point which is good news. No fevers, chills, nausea, vomiting, or diarrhea. She is mainly been leaving these areas open to air as much as possible at this point at 1 point she was trying to keep them covered more but that really did not help either. Fortunately there is no signs of active systemic nor local infection at this time. 03/02/2019 on evaluation today patient appears to be doing well with regard to her wounds. The leg ulcers which were very dry are much more moist at this point and seem to be doing great at this time. Overall I am very pleased with how things seem to be progressing. With regard to her foot ulcer it is showing some signs of slight improvement in my opinion overall there does not appear to be anything worse at least which is also good news. No fevers, chills, nausea, vomiting, or diarrhea. 03/09/2019 on evaluation today patient appears to be doing better with regard to her lower extremity ulcers a lot of the necrotic tissue is loosening up which is good news. In regard to her right plantar foot ulcer this appears to be roughly the same based on what I am seeing currently although there is less callus buildup. Unfortunately she does have an open wound area on the left lateral foot region which unfortunately probes down to bone once this was thoroughly examined. This is something she did not even know was opened she had had a Band-Aid over it just for protection last couple times I saw her. 03/16/19 evaluation today  patient appears to be doing Someone poorly in regard to her left lower extremity. This seems to be likely infected based on what Ioom seeing at this point. Thereoos no signs of active infection at this time systemically but nonetheless thereoos a lot more erythema of the foot in general unfortunately. Her x-rays were reviewed and did not show any evidence of infection of the right foot. On the left there was a A little bit more going on but no obvious evidence of osteomyelitis though it was mentioned that if that was still a concern that number I would be the best way to further evaluate this. I think they were going to need to proceed with MRI to be honest based on what Ioom seeing today. 03/23/2019 on evaluation today patient actually appears to be doing worse compared to even last week's visit with regard to her wounds especially the left foot but even the right lower extremity. Both are measuring deeper as far as the overall depth of the wound on the left foot this also seems to have spread as far as the open wound location. It also does appear to me that the cellulitis has spread further up her leg which also has me very concerned at this point. There fortunately is no signs of active infection at this point systemically such as evidence of sepsis but nonetheless I am still concerned about the fact that again the patient does not seem to be turning around. We did have her on IV vancomycin through dialysis which they graciously help this with as well. With that being said it appears based on her culture results that I did review today that cefepime or something of  the like would likely be a better option the vancomycin is not likely to help with the Serratia species that was identified. 03/28/19 Fem-Pop 04/06/19 Amp toe 33month follow-up with vascular in april 05/18/2019 on evaluation today patient presents for reevaluation here in the clinic after having been sent to the ER by myself December  2020 due to overall worsening of her condition. She did have a femoral-popliteal bypass performed on 03/28/2019. 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 Subsequently she does have a 24-month follow-up with vascular in April but seems to be doing well from a vascular standpoint at this time. Overall the main issue for which she comes in today she does have a area on the plantar aspect of her right foot where she does have a callus there may or may not be a wound under this region. Subsequently she does have several small ulcerations on the bilateral lower extremities that are more venous stasis in nature. Continue to these do need to be addressed today as well. Fortunately there is no signs of active infection at this time. 05/25/2019 upon evaluation today patient appears to be doing well in regard to her lower extremities. Fortunately there is no signs of active infection at this time. No fever chills noted. She has been tolerating the dressing changes without complication. Fortunately she is doing much better in regard to her plantar foot in fact this appears to be completely healed which is excellent news. 06/01/2019 on evaluation today patient actually appears to be doing very well in regard to her wounds at this point. Fortunately there is no signs of active infection. No fevers, chills, nausea, vomiting, or diarrhea. 06/08/2019 upon evaluation today patient actually appears to be doing great in regard to her right lower extremity there are no open wounds remaining and she has done extremely well. Unfortunately she has several blistered areas that opened up on the left lower extremity in the interim between last week and this week. She has not been wearing any compression she states that I never told her anything about wearing compression. With that being said this is something that we have discussed in the past and it is something  that we wanted her to do and again I did remind her of that today as well. 06/15/2019 upon evaluation today patient appears to be doing well with regard to her wound she does have some dressing material stuck to the surface of her wounds apparently they dried out and healed fairly quickly causing this to stick. Fortunately we should be able to clean this off but again that is one of the main issues that I see at this point. Fortunately there is no signs of active infection at this time. No fevers, chills, nausea, vomiting, or diarrhea. 06/22/2019 upon evaluation today patient appears to be doing very well in regard to her wounds. She has been tolerating the dressing changes without complication. Fortunately there is no signs of active infection at this time. No fevers, chills, nausea, vomiting, or diarrhea. In fact the right leg appears to be completely healed she does have 1 pair of the compression socks with her 15-20 mmHg. Subsequently her left leg is doing much better although not completely healed she still has 1 area open at this point. 06/29/2019 on evaluation today patient appears to be doing excellent in regard to her left lower extremity. There is no signs of active infection at this time the wounds  appear to be completely healed and overall she is doing excellent. Readmission: 02/01/2020 upon evaluation today patient appears to be doing somewhat poorly in regard to her right foot ulcer locations. She has 3 spots that are problematic at this time. Unfortunately she has been seeing podiatry but they have not been able to get them to improve. She does have poor blood flow here and I do believe that she really needs to go back and see vascular for further evaluation and recommendations in this regard. I also think an x-ray would be ideal considering the fact that that according to the patient has not been done as of yet. I also believe Santyl will likely be beneficial for her. She has had a  bypass surgery on her leg on the left they were holding off on the right unless it became an absolute necessity due to the fact that unfortunately she also has had a heart bypass which has been somewhat problematic for further intervention in the right lower extremity. 02/08/2020 upon evaluation today patient appears to be doing well with regard to her foot all things considered at each wound location. With that being said the x- ray was negative for any signs of obvious osteomyelitis. Nonetheless she is still awaiting the ultrasound and visit with Dr. Donnetta Hutching due to critical limb ischemia. Hopefully that will be coming up in the next week which the patient tells me is so. Objective Constitutional Well-nourished and well-hydrated in no acute distress. Vitals Time Taken: 2:12 PM, Height: 64 in, Weight: 150 lbs, BMI: 25.7, Temperature: 98.4 F, Pulse: 86 bpm, Respiratory Rate: 20 breaths/min, Blood Pressure: 147/77 mmHg, Capillary Blood Glucose: 112 mg/dl. Respiratory normal breathing without difficulty. Psychiatric this patient is able to make decisions and demonstrates good insight into disease process. Alert and Oriented x 3. pleasant and cooperative. General Notes: Upon inspection today patient still has necrotic tissue noted but again based on the blood flow issues that we have going on I am not to perform any sharp debridement at this point. We will continue to keep a close eye on this for example do his job on the lateral portion of her foot. Integumentary (Hair, Skin) Wound #16 status is Open. Original cause of wound was Gradually Appeared. The wound is located on the Right,Distal,Lateral Foot. The wound measures 0.7cm length x 0.5cm width x 0.1cm depth; 0.275cm^2 area and 0.027cm^3 volume. There is no tunneling or undermining noted. There is a none present amount of drainage noted. The wound margin is distinct with the outline attached to the wound base. There is no granulation within the  wound bed. There is a large (67- 100%) amount of necrotic tissue within the wound bed including Eschar. Wound #17 status is Open. Original cause of wound was Gradually Appeared. The wound is located on the Isabella. The wound measures 0.1cm length x 0.1cm width x 0.5cm depth; 0.008cm^2 area and 0.004cm^3 volume. There is Fat Layer (Subcutaneous Tissue) exposed. There is no tunneling or undermining noted. There is a small amount of serous drainage noted. The wound margin is epibole. There is large (67-100%) pink granulation within the wound bed. There is no necrotic tissue within the wound bed. Wound #18 status is Open. Original cause of wound was Gradually Appeared. The wound is located on the Right,Proximal,Lateral Foot. The wound measures 4.6cm length x 1.4cm width x 0.3cm depth; 5.058cm^2 area and 1.517cm^3 volume. There is no tunneling or undermining noted. There is a medium amount of serosanguineous drainage noted. The wound  margin is epibole. There is small (1-33%) pink granulation within the wound bed. There is a large (67-100%) amount of necrotic tissue within the wound bed including Eschar and Adherent Slough. 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 There was a Chemical/Enzymatic/Mechanical debridement performed by Carlene Coria, RN.Marland Kitchen Agent used was [employer]. A time out  was conducted at 14:50, prior to the start of the procedure. There was no bleeding. The procedure was tolerated well. Post Debridement Measurements: 0.7cm length x 0.5cm width x 0.1cm depth; 0.027cm^3 volume. Character of Wound/Ulcer Post Debridement requires further debridement. Severity of Tissue Post Debridement is: Fat layer exposed. Post procedure Diagnosis Wound #16: Same as Pre-Procedure Wound #18 Pre-procedure diagnosis of Wound #18 is a Diabetic Wound/Ulcer of the Lower Extremity located on the Right,Proximal,Lateral Foot .Severity of Tissue Pre Debridement is: Fat layer exposed. There was a Chemical/Enzymatic/Mechanical debridement performed by Carlene Coria, RN.Marland Kitchen Agent used was [employer]. A time out was conducted at 14:50, prior to the start of the procedure. There was no bleeding. The procedure was tolerated well. Post Debridement Measurements: 4.6cm length x 1.4cm width x 0.3cm depth; 1.517cm^3 volume. Character of Wound/Ulcer Post Debridement requires further debridement. Severity of Tissue Post Debridement is: Fat layer exposed. Post procedure Diagnosis Wound #18: Same as Pre-Procedure Plan Follow-up Appointments: Return Appointment in 2 weeks. Dressing Change Frequency: Wound #16 Right,Distal,Lateral Foot: Change dressing every day. Wound #17 Right,Plantar Foot: Change dressing every day. Wound #18 Right,Proximal,Lateral Foot: Change dressing every day. Skin Barriers/Peri-Wound Care: Moisturizing lotion - to foot and leg with dressing changes Wound Cleansing: May shower and wash wound with soap and water. - use dial antibacterial soap Primary Wound Dressing: Wound #16 Right,Distal,Lateral Foot: Santyl Ointment Wound #17 Right,Plantar Foot: Silver Collagen Wound #18 Right,Proximal,Lateral Foot: Santyl Ointment Secondary Dressing: Wound #16 Right,Distal,Lateral Foot: Kerlix/Rolled Gauze Dry Gauze Wound #17 Right,Plantar Foot: Foam - foam donut Kerlix/Rolled Gauze Dry  Gauze Wound #18 Right,Proximal,Lateral Foot: Kerlix/Rolled Gauze Dry Gauze Home Health: [facility]. - [place] 1. I would recommend currently that we continue with the wound care measures as before with regard to the [facility] for the lateral foot I think that still appropriate. 2. With regard to her remaining wounds I would recommend at this time that we also continue with the collagen to the plantar foot which also seems to be beneficial for her. 3. I am also going to suggest she continue to try to keep pressure off of the foot area in general so that we do not worsen any of these wound locations. 4. She has the arterial ultrasound and visit with Dr. Donnetta Hutching coming up in the next several days. We will see patient back for reevaluation in 1 week here in the clinic. If anything worsens or changes patient will contact our office for additional recommendations. Electronic Signature(s) Signed: 02/08/2020 5:20:58 PM By: Worthy Keeler PA-C Signed: 02/08/2020 5:51:45 PM By: Baruch Gouty RN, BSN Previous Signature: 02/08/2020 2:50:19 PM Version By: Worthy Keeler  PA-C Entered By: Baruch Gouty on 02/08/2020 14:52:17 -------------------------------------------------------------------------------- SuperBill Details Patient Name: Date of Service: [name], DO NNA J. 02/08/2020 Medical Record Number: [id number] Patient Account Number: [id number] Date of Birth/Sex: Treating RN: July 23, 1945 (75 y.o. [name] Primary Care Provider: [name] Other Clinician: Referring Provider: Treating Provider/Extender: Azucena Kuba in Treatment: 1 Diagnosis Coding ICD-10 Codes Code Description I87.2 Venous insufficiency (chronic) (peripheral) I89.0 Lymphedema, not elsewhere classified E11.621 Type 2 diabetes mellitus with foot ulcer I73.89 Other specified peripheral vascular diseases L97.512 Non-pressure chronic  ulcer of 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d L97.518 Non-pressure chronic ulcer of other part of right foot with other specified severity N18.6 End stage renal disease Z99.2 Dependence on renal dialysis I10 Essential (primary) hypertension Facility Procedures CPT4 Code: [id number] Description: 7602 - DEBRIDE W/O ANES NON SELECT Modifier: Quantity: 1 Physician Procedures : CPT4 Code Description Modifier [id number] - WC PHYS LEVEL 4 - EST PT ICD-10 Diagnosis Description I87.2 Venous insufficiency (chronic) (peripheral) I89.0 Lymphedema, not elsewhere classified E11.621 Type 2 diabetes mellitus with foot ulcer I73.89  Other specified peripheral vascular diseases Quantity: 1 Electronic Signature(s) Signed: 02/08/2020 5:20:58 PM By: Worthy Keeler PA-C Signed: 02/08/2020 5:51:45 PM By: Baruch Gouty RN, BSN Previous Signature: 02/08/2020 2:50:34 PM Version By: Worthy Keeler PA-C Entered By: Baruch Gouty on 02/08/2020 14:52:52

## 2020-02-09 NOTE — Progress Notes (Signed)
  Subjective: 73 y.o. returns the office today for painful, elongated, thickened toenails which she cannot trim herself.  She has been going to the wound care center for treatment of the wound of the right foot has been applying Santyl.  She presents today for a nail trim the nails are thickened.  No new ulcerations identified.  Denies any systemic complaints such as fevers, chills, nausea, vomiting.   PCP: Unk Pinto, MD  Objective: AAO 3, NAD DP/PT pulses palpable, CRT less than 3 seconds Protective sensation decreased with Simms Weinstein monofilament Nails hypertrophic, dystrophic, elongated, brittle, discolored 6 to Right 1, 3, 4 and left 1,3, 4. There is tenderness overlying the associated nails. There is no surrounding erythema or drainage along the nail sites. Ulceration present to the right lateral aspect the right foot appears to be improved with Santyl.  More granulation tissue is present compared to last saw there is no surrounding erythema, drainage or pus or ascending cellulitis.  On the plantar aspect left foot is flat, dried blister but there is no open sores or drainage. No other areas of tenderness bilateral lower extremities. No overlying edema, erythema, increased warmth. No pain with calf compression, swelling, warmth, erythema.  Assessment: Patient presents with symptomatic onychomycosis  Plan: -Treatment options including alternatives, risks, complications were discussed -Nails sharply debrided 6 without complication/bleeding. -We will defer the wound care to the wound care center. -Discussed daily foot inspection. If there are any changes, to call the office immediately.  -Follow-up in 3 months or sooner if any problems are to arise. In the meantime, encouraged to call the office with any questions, concerns, changes symptoms.  Celesta Gentile, DPM

## 2020-02-10 DIAGNOSIS — S91301D Unspecified open wound, right foot, subsequent encounter: Secondary | ICD-10-CM | POA: Diagnosis not present

## 2020-02-10 DIAGNOSIS — I2510 Atherosclerotic heart disease of native coronary artery without angina pectoris: Secondary | ICD-10-CM | POA: Diagnosis not present

## 2020-02-10 DIAGNOSIS — E11319 Type 2 diabetes mellitus with unspecified diabetic retinopathy without macular edema: Secondary | ICD-10-CM | POA: Insufficient documentation

## 2020-02-10 DIAGNOSIS — E11621 Type 2 diabetes mellitus with foot ulcer: Secondary | ICD-10-CM | POA: Diagnosis not present

## 2020-02-10 DIAGNOSIS — E1151 Type 2 diabetes mellitus with diabetic peripheral angiopathy without gangrene: Secondary | ICD-10-CM | POA: Diagnosis not present

## 2020-02-10 DIAGNOSIS — E1140 Type 2 diabetes mellitus with diabetic neuropathy, unspecified: Secondary | ICD-10-CM | POA: Diagnosis not present

## 2020-02-10 DIAGNOSIS — L97412 Non-pressure chronic ulcer of right heel and midfoot with fat layer exposed: Secondary | ICD-10-CM | POA: Diagnosis not present

## 2020-02-10 NOTE — Progress Notes (Signed)
Patient ID: TANAYA DUNIGAN, female   DOB: 09-May-1945, 74 y.o.   MRN: [id number]  CPE AND FOLLOW UP  Assessment:   Diagnoses and all orders for this visit:  Encounter for annual wellness exam Reminded to schedule annual mammogram, diabetes eye, dental appointments  Peripheral vascular disease (HCC)/ s/p fem/pop bypass surgery  Follows with Dr. Donnetta Hutching  Control blood pressure, cholesterol, glucose, increase exercise.   Coronary artery disease involving coronary bypass graft of native heart without angina pectoris Control blood pressure, cholesterol, glucose, increase exercise.  Followed by Dr. Johnsie Cancel   Chronic combined systolic and diastolic congestive heart failure ([place]) Managed by dialysis Encouraged daily monitoring of the patient's weight, call office if 5 lb weight loss or gain in a day.  Encouraged regular exercise. If any increasing shortness of breath, swelling, or chest pressure go to ER immediately.  decrease your fluid intake to less than 2 L daily please remember to always increase your potassium intake with any increase of your fluid pill.   ESRD (end stage renal disease) ([place]) Continue dialysis Followed by Dr. Hollie Salk & colleagues  Type II diabetes mellitus with neurological manifestations, uncontrolled ([place]) Discussed general issues about diabetes pathophysiology and management., Educational material distributed., Suggested low cholesterol diet., Encouraged aerobic exercise., Discussed foot care., Reminded to get yearly retinal exam. -     Hemoglobin A1c  Diabetes mellitus type 2, insulin dependent ([place]) Discussed general issues about diabetes pathophysiology and management., Educational material distributed., Suggested low cholesterol diet., Encouraged aerobic exercise., Discussed foot care., Reminded to get yearly retinal exam. -     Hemoglobin A1c  EMPHYSEMA Well controlled off of inhalers, continue to monitor  Asthma mild intermittent without exacerbation Well  controlled off of inhalers, continue to monitor  Malignant neoplasm of upper-inner quadrant of left breast in female, estrogen receptor positive ([place]) Off of anastrozole after 5 years, Dr. 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   Reminder to go to the ER if any CP, SOB, nausea, dizziness, severe HA, changes vision/speech, left arm numbness and tingling and jaw pain.  Mitral valve mass Follow up with Dr. Johnsie Cancel as recommended  Gouty arthritis Continue allopurinol Diet discussed Check uric acid as needed  Vitamin D deficiency At goal at recent check; continue to recommend supplementation for goal of 60-100 Defer vitamin D level due to cost   Medication management CBC, CMP/GFR, magnesium  Hyperlipidemia associated with T2DM (HCC) Continue medications- atorvastatin  LDL goal <70 Continue low cholesterol diet and exercise.  Check lipid panel.   BMI 26  Anemia, of chronic disease ([place]) Managed by nephrology  -CBC  Mitral valve mass Follows with Dr. Nishan q106m  S/p bil toe amputations (HCC)/ foot infections She is followed closely by podiatry, vascular, wound center S/p bil 2nd and 5th digits, active infection to R foot sole followed by above, has follow up this week; dressing not removed today per patient request Discussed daily foot checks, control sugars, high risk for complications, low threshold for requesting evaluation   Debility/weakness/ high risk falls Continue walker, repeat PT next year if needed Fall preventions discussed  Need for influenza vaccine High dose quadrivalent administered without complication today   Abnormal EKG Significant left axis  deviation, lateral ST-T  abnormality, tall T waves - significant changes from last EKG despite repeat EKG;  denies denies any cardiac sx today ? Accuracy of results, possibly r/t significant scar tissue of left breast with hx of breast cancer and lead placement elements After discussion with patient l check STAT troponin to evaluation obvious acute cardiac etiology, otherwise will recommend she follow up with Dr. Johnsie Cancel and obtain EKG there for comparison to their most recent baseline    Over 30 minutes of exam, counseling, chart review, and critical decision making was performed   Future Appointments  Date Time Provider [place]  02/17/2020  3:00 PM [facility] VASC 4 - SS [facility]  02/20/2020  1:40 PM Early, Arvilla Meres, MD [facility]  02/22/2020 12:30 PM Jeri Cos Eday III, PA-C [facility]  04/11/2020  2:15 PM Marzetta Board, DPM [facility]  05/09/2020 11:15 AM Liane Comber, NP [facility] None  02/13/2021  2:00 PM Liane Comber, NP [facility] None     Plan:   During the course of the visit the patient was educated and counseled about appropriate screening and preventive services including:    Pneumococcal vaccine   Influenza vaccine  Td vaccine  Prevnar 13  Screening electrocardiogram  Screening mammography  Bone densitometry screening  Colorectal cancer screening  Diabetes screening  Glaucoma screening  Nutrition counseling   Advanced directives: given info/requested copies    Subjective:   GERTURDE KUBA is a 74 y.o. female who presents for CPE and 3 month follow up on hypertension, diabetes, hyperlipidemia, vitamin D def.   While hospitalized [place] density on the mitral valve was incidentally found TTE on 11/28/2018 confirmed by TEE on 12/02/2018. Blood cultures were negative, clinical presentation did not suggest endocarditis, per inpatient cardiology thought to be a calcified subchondral apparatus without further recommendation  for workup at the time. Outpatient cardiology was recommended, follows with Dr. Nishan q76m.   Patient was admitted for left little toe osteomyelitis in 03/2019, abscess and sepsis complicating PAD. S/p aortogram with bilateral lower extremity arteriogram with runoff 12/18 followed by left CFA endarterectomy and Dacron patch angioplasty, left femoral to below-knee popliteal bypass graft on 12/2 by Dr. Donnetta Hutching. MRI of left foot showed septic arthritis of the fifth MTP joint with osteomyelitis involving the distal half of the fifth metatarsal and also the proximal phalanx. Orthopedic service is consulted in regards to findings of MRI of left foot Dr. Sharol Given and underwent left fifth ray amputation 04/06/2019. Was later readmitted for rehab due to debility. She follows with podiatry Dr. Colvin Caroli, also recently following with wound center PA, Hoit Stone. Has home health nurse who comes by her home to change dressings once a week.   She is using walker for ambulation. Husband drives her. She apparently had a fall following discharge resulting in left anterior shoulder dislocation, underwent closed reduction by Dr. Ninfa Linden on 05/27/2019. Denies falls since this time.   She has had breast cancer in 2014, s/p lumpectomy, followed with [name], she completed 5 years on anastrozole and was discharged. Continues with annual mammograms, most recent in 09/2019.   She has ESRD and initiated on hemodialysis, Tues, Thurs, Sat; she is followed by [facility], now follows with Dr. Carolin Sicks.   BMI is Body mass index is 26.61 kg/m., she has not been working on diet and exercise. Wt Readings from Last 3 Encounters:  02/13/20 155 lb (70.3 kg)  10/28/19 157 lb (71.2 kg)  10/03/19 156 lb (70.8 kg)  Her blood pressure has been controlled at home, today their BP is BP: 140/68 not on medications secondary to dialysis She does not workout. She denies chest pain, shortness of breath, dizziness.   She is on  cholesterol medication atorvastatin 10 mg daily and denies myalgias. Her cholesterol is at goal. The cholesterol last visit was:   Lab Results  Component Value Date   CHOL 99 10/03/2019   HDL 48 (L) 10/03/2019   LDLCALC 35 10/03/2019   TRIG 79 10/03/2019   CHOLHDL 2.1 10/03/2019   She has been working on diet and exercise for Diabetes with diabetic chronic kidney disease, with other circulatory complications, with diabetic polyneuropathy and with diabetic retinopathy}, she is on bASA, she is not on ACE/ARB, she is on novolin 70/30, taking 10-15 units and 10-15 in the evening, and denies paresthesia of the feet, polydipsia, polyuria and visual disturbances.  Fasting has been well controlled, 90-120.  Has had a low sugar overnight rarely when she has low carb dinner, has some hard candies at bedtime if needed Retinopathy per 11/2019 Dr. Bing Plume  Last A1C was:  Lab Results  Component Value Date   HGBA1C 5.3 10/03/2019   She has ESRD, follows with Dr. Carolin Sicks, has hemodialysis 3 days a week.  Lab Results  Component Value Date   GFRNONAA 11 (L) 10/03/2019   Patient is on Vitamin D supplement. Lab Results  Component Value Date   VD25OH 56 10/03/2019     Patient is on allopurinol for gout and does not report a recent flare.  Lab Results  Component Value Date   LABURIC 3.9 10/03/2019  '      Medication Review Current Outpatient Medications on File Prior to Visit  Medication Sig Dispense Refill  . allopurinol (ZYLOPRIM) 100 MG tablet TAKE 1 TABLET TWICE DAILY TO PREVENT GOUT 180 tablet 3  . ARTIFICIAL TEAR SOLUTION OP Place 1 drop into both eyes in the morning and at bedtime.    Marland Kitchen aspirin EC 81 MG tablet Take 81 mg by mouth as needed. Alternates with Excedrin    . aspirin-acetaminophen-caffeine (EXCEDRIN EXTRA STRENGTH) 250-250-65 MG tablet Take 1 tablet by mouth 2 (two) times daily as needed (pain).    Marland Kitchen atorvastatin (LIPITOR) 10 MG tablet Take 1 tablet daily for cholesterol. 90  tablet 1  . b complex-vitamin c-folic acid (NEPHRO-VITE) 0.8 MG TABS tablet Take 1 tablet by mouth daily.     . Bisacodyl (LAXATIVE PO) Take 1 capsule by mouth 3 (three) times daily.    . cinacalcet (SENSIPAR) 30 MG tablet Take 1 tablet (30 mg total) by mouth daily with supper. 60 tablet 1  . cyclobenzaprine (FLEXERIL) 5 MG tablet Take 1 tablet (5 mg total) by mouth 3 (three) times daily as needed for muscle spasms. 60 tablet 1  . Darbepoetin Alfa (ARANESP) 100 MCG/0.5ML SOSY injection Inject 0.5 mLs (100 mcg total) into the vein every Tuesday with hemodialysis. 4.2 mL   . dextrose 50 % solution Dextrose 50%    . docusate sodium (COLACE) 100 MG capsule Take 200 mg by mouth 2 (two) times daily.     Marland Kitchen doxercalciferol (HECTOROL) 4 MCG/2ML injection Inject 2 mLs (4 mcg total) into the vein Every Tuesday,Thursday,and Saturday with dialysis. 2 mL   . doxycycline (VIBRA-TABS) 100 MG tablet Take 1 tablet (100 mg total) by mouth 2 (two) times daily. 14 tablet 0  . ferric gluconate 125 mg in sodium chloride 0.9 % 100 mL Inject 125 mg into the  vein Every Tuesday,Thursday,and Saturday with dialysis.    Marland Kitchen glucose blood (PRODIGY NO CODING BLOOD GLUC) test strip USE 1 STRIP TO CHECK GLUCOSE THREE TIMES DAILY-DX-E11.9 250 each 0  . Homeopathic Products (THERAWORX RELIEF EX) Apply 1 application topically daily as needed (pain).    . hydrALAZINE (APRESOLINE) 25 MG tablet Take     3 tablets (75 mg)      3 x /day        for BP & Heart 810 tablet 0  . HYDROcodone-acetaminophen (NORCO/VICODIN) 5-325 MG tablet Take 1 tablet by mouth every 6 (six) hours as needed. 10 tablet 0  . insulin NPH-regular Human (70-30) 100 UNIT/ML injection Inject 10-30 Units into the skin 2 (two) times daily with a meal.     . Insulin Pen Needle (BD PEN NEEDLE NANO U/F) 32G X 4 MM MISC USE TO INJECT TWICE DAILY LANTUS INJECTIONS 180 each 3  . isosorbide dinitrate (ISORDIL) 10 MG tablet TAKE 1 TABLET THREE TIMES A DAY FOR BLOOD PRESSURE AND HEART  270 tablet 3  . Methoxy PEG-Epoetin Beta (MIRCERA IJ) Mircera    . Multiple Vitamins-Minerals (PRESERVISION AREDS 2 PO) Take 2 capsules by mouth daily at 12 noon.     . predniSONE (DELTASONE) 20 MG tablet     . pregabalin (LYRICA) 100 MG capsule Take 1  Capsule    2 to 3 x   /day if needed for Sciatica pain 90 capsule 0  . Probiotic Product ([place]) CAPS Take 1 capsule by mouth in the morning and at bedtime.    Marland Kitchen SANTYL ointment     . sevelamer carbonate (RENVELA) 800 MG tablet Take 2 tablets (1,600 mg total) by mouth 3 (three) times daily with meals. 90 tablet 1  . traZODone (DESYREL) 50 MG tablet Take 50 mg by mouth at bedtime as needed for sleep.     No current facility-administered medications on file prior to visit.    Current Problems (verified) Patient Active Problem List   Diagnosis Date Noted  . Left toe amputee ([place]) 02/13/2020  . Abnormal EKG 02/13/2020  . Diabetic retinopathy of both eyes associated with type 2 diabetes mellitus ([place]) 02/10/2020  . Headache, unspecified 08/02/2019  . Neuropathic pain   . Labile blood glucose   . Debility 04/07/2019  . History of complete ray amputation of fifth toe of left foot ([place]) 04/07/2019  . S/P femoral-popliteal bypass surgery 04/07/2019  . Chronic bilateral low back pain without sciatica   . Abscess of right foot   . Cellulitis of unspecified part of limb 01/27/2019  . Hyperlipidemia associated with type 2 diabetes mellitus ([place]) 01/09/2019  . Diabetes mellitus due to underlying condition with chronic kidney disease on chronic dialysis, with long-term current use of insulin ([place]) 01/09/2019  . Mild protein-calorie malnutrition ([place]) 09/06/2018  . Anemia in chronic kidney disease 09/03/2018  . Coagulation defect, unspecified ([place]) 09/03/2018  . Iron deficiency anemia, unspecified 09/03/2018  . Pain, unspecified 09/03/2018  . Secondary hyperparathyroidism of renal origin ([place]) 09/03/2018  . End stage renal disease  ([place]) 05/24/2018  . ESRD on hemodialysis ([place])   . Generalized weakness   . Mitral valve mass   . Coronary artery disease involving coronary bypass graft of native heart without angina pectoris   . Anemia 05/03/2017  . Diabetes mellitus type 2, insulin dependent ([place]) 05/08/2015  . Overweight (BMI 25.0-29.9) 02/14/2015  . Medication management 08/04/2014  . Diabetic neuropathy ([place]) 02/17/2014  . Gouty arthritis   .  Vitamin D deficiency   . Malignant neoplasm of upper-inner quadrant of left breast in female, estrogen receptor positive ([place]) 02/14/2013  . PVD (peripheral vascular disease) ([place]) 09/05/2009  . Essential hypertension 06/20/2009  . Congestive heart failure ([place]) 06/20/2009  . Asthma 06/20/2009    Screening Tests Immunization History  Administered Date(s) Administered  . Hepatitis B, adult 12/24/2017, 01/21/2018, 02/25/2018, 06/24/2018, 12/30/2018, 02/15/2019, 03/17/2019, 07/16/2019  . Influenza, High Dose Seasonal PF 01/30/2014, 02/14/2015, 12/17/2015, 01/22/2017, 01/07/2018, 12/23/2018, 02/13/2020  . Influenza-Unspecified 02/05/2013  . Pneumococcal Conjugate-13 01/30/2014  . Pneumococcal Polysaccharide-23 01/07/2011  . Td 01/13/2012  . Zoster 01/13/2012    Preventative care: Last colonoscopy: 2011, due 03/2020 Last mammogram: 09/2019 [facility] DEXA: 09/2019, T -1.4 radius Echo 2019   Prior vaccinations: TD or Tdap: 2013  Influenza: 2020 TODAY  Pneumococcal: 2012  Prevnar13: 2015 Shingrix: will check with insurance Zostavax: 2013 Covid 19: 2/2, pfizer, 2021, needs information  Names of Other Physician/Practitioners you currently use: 1. [facility]- here for primary care 2. Dr. Bing Plume, eye doctor, last visit 12/06/2019 retinopathy  3. Dr. Satira Sark, Dr. Mallie Snooks , dentist, last visit feb 2018, OVERDUE  Patient Care Team: Unk Pinto, MD as PCP - General (Internal Medicine) Josue Hector, MD as PCP - Cardiology  (Cardiology) Magrinat, Virgie Dad, MD as Consulting Physician (Oncology) Kyung Rudd, MD as Consulting Physician (Radiation Oncology) Newt Minion, MD as Consulting Physician (Orthopedic Surgery) Philemon Kingdom, MD as Consulting Physician (Internal Medicine) Josue Hector, MD as Consulting Physician (Cardiology) Inda Castle, MD (Inactive) as Consulting Physician (Gastroenterology) Rolm Bookbinder, MD as Consulting Physician (General Surgery) Calvert Cantor, MD as Consulting Physician (Ophthalmology) Madelon Lips, MD as Consulting Physician (Nephrology) Allergies Allergies  Allergen Reactions  . Atenolol Rash and Other (See Comments)    Exacerbates gout   . Adhesive [Tape] Other (See Comments)    SKIN IS VERY SENSITIVE AND BRUISES AND TEARS EASILY; PLEASE USE AN ALTERNATIVE TO TAPE!!  . Contrast Media [Iodinated Diagnostic Agents] Rash  . Iohexol Rash  . Latex Rash    SURGICAL HISTORY She  has a past surgical history that includes Cataract extraction w/ intraocular lens  implant, bilateral (2000's); Finger surgery (Right); Coronary artery bypass graft (2011); Dental surgery (04/30/11); AMPUTATION RAY (Right, 08/27/2012); Cardiac catheterization (05/24/2009); Amputation (Right, 08/27/2012); Eye surgery; Colonoscopy w/ biopsies and polypectomy (2010); Breast lumpectomy (04/20/2013); Breast lumpectomy with needle localization and axillary sentinel lymph node bx (Left, 04/20/2013); Dilation and curettage of uterus; Amputation (Left, 07/08/2013); Amputation toe (Right, 03/06/2017); AV fistula placement (Right, 11/11/2017); IR Fluoro Guide CV Line Right (11/30/2017); TEE without cardioversion (N/A, 12/01/2017); ABDOMINAL AORTOGRAM W/LOWER EXTREMITY (Bilateral, 03/25/2019); Endarterectomy femoral (Left, 03/28/2019); Femoral-popliteal Bypass Graft (Left, 03/28/2019); Patch angioplasty (Left, 03/28/2019); Amputation (Left, 04/06/2019); and Shoulder Closed Reduction (Left, 05/27/2019). FAMILY  HISTORY Her family history includes Breast cancer in her maternal aunt; Breast cancer (age of onset: 68) in her sister; Breast cancer (age of onset: 58) in her sister; Hypertension in her brother; Kidney cancer (age of onset: 51) in her brother; Throat cancer (age of onset: 56) in her father. SOCIAL HISTORY She  reports that she has never smoked. She has never used smokeless tobacco. She reports previous alcohol use. She reports that she does not use drugs.   Review of Systems  Constitutional: Negative for malaise/fatigue and weight loss.  HENT: Negative for hearing loss and tinnitus.   Eyes: Negative for blurred vision and double vision.  Respiratory: Negative for cough, sputum production, shortness  of breath and wheezing.   Cardiovascular: Negative for chest pain, palpitations, orthopnea, claudication, leg swelling and PND.  Gastrointestinal: Negative for abdominal pain, blood in stool, constipation, diarrhea, heartburn, melena, nausea and vomiting.  Genitourinary: Negative.   Musculoskeletal: Negative for falls, joint pain and myalgias.  Skin: Negative for rash.  Neurological: Negative for dizziness, tingling, sensory change, weakness and headaches.  Endo/Heme/Allergies: Negative for polydipsia.  Psychiatric/Behavioral: Negative.  Negative for depression, memory loss, substance abuse and suicidal ideas. The patient is not nervous/anxious and does not have insomnia.   All other systems reviewed and are negative.    Objective:   Today's Vitals   02/13/20 1512  BP: 140/68  Pulse: 73  Temp: 97.9 F (36.6 C)  SpO2: 93%  Weight: 155 lb (70.3 kg)  Height: 5\' 4"  (1.626 m)   Body mass index is 26.61 kg/m.  General appearance: alert, no distress, WD/WN,  female HEENT: normocephalic, sclerae anicteric, TMs pearly, nares patent, no discharge or erythema, pharynx normal Oral cavity: MMM, no lesions Neck: supple, no lymphadenopathy, no thyromegaly, no masses Heart: RRR, normal S1, S2,  no murmurs Lungs: CTA bilaterally, no wheezes, rhonchi, or rales Abdomen: +bs, soft, non tender, non distended, no masses, no hepatomegaly, no splenomegaly Musculoskeletal: nontender, no swelling; toe amputations bil 2nd and 5th digits, R foot with dressing to sole, 4/5 weakness throughout, slow sitting to standing, ambulates with walker Extremities: no edema, no cyanosis, no clubbing; fistula to RUE intact with thrill, dressing in place; bil legs with compression dressing in place up to knees Neurological: alert, oriented x 3, CN2-12 intact, strength symmetrical but weak throughout 4/5, no cerebellar signs, gait slow steady with walker Psychiatric: normal affect, behavior normal, pleasant  Breasts: right breast normal without mass, skin or nipple changes or axillary nodes, left breast with diffuse dense texture throughout limiting, no axillary nodes, nipple inversion, skin changes. Gyn: defer Rectal: defer  EKG: sinus rhythm, LVH, marked left axis deviations, ? LBBB, questionable lateral ST-T abnormality and tall T waves despite multiple attempts, new from previous, denies concerning sx, ? Secondary to L breast scar tissue from hx of breast cancer  [name], NP   02/13/2020

## 2020-02-10 NOTE — Progress Notes (Signed)
Pamela, [name] ([id number]) Visit Report for 02/08/2020 Arrival Information Details Patient Name: Date of Service: [name], [place] NNA J. 02/08/2020 2:00 PM Medical Record Number: [id number] Patient Account Number: [id number] Date of Birth/Sex: Treating RN: [birth year] (75 y.o. [name], Meta.Reding Primary Care Viann Nielson: [name] Other Clinician: Referring Unknown Flannigan: Treating Jaylin Benzel/Extender: Azucena Kuba in Treatment: 1 Visit Information History Since Last Visit Added or deleted any medications: No Patient Arrived: Wheel Chair Any new allergies or adverse reactions: No Arrival Time: 14:10 Had a fall or experienced change in No Accompanied By: self activities of daily living that may affect Transfer Assistance: None risk of falls: Patient Identification Verified: Yes Signs or symptoms of abuse/neglect since No Secondary Verification Process Completed: Yes last visito Patient Requires Transmission-Based Precautions: No Hospitalized since last visit: No Patient Has Alerts: Yes Implantable device outside of the clinic No Patient Alerts: R ABI = Man TBI = 0.39 excluding L ABI = Nason TBI = 0.79 cellular tissue based products placed in the 07/2019 center since last visit: Has Dressing in Place as Prescribed: Yes Has Compression in Place as Prescribed: Yes Has Footwear/Offloading in Place as Yes Prescribed: Right: Surgical [name] with Pressure Relief Insole Pain Present Now: No Electronic Signature(s) Signed: 02/08/2020 5:31:28 PM By: Deon Pilling Entered By: Deon Pilling on 02/08/2020 14:26:33 -------------------------------------------------------------------------------- Encounter Discharge Information Details Patient Name: Date of Service: Pamela Pilon, DO NNA J. 02/08/2020 2:00 PM Medical Record Number: [id number] Patient Account Number: [id number] Date of Birth/Sex: Treating RN: 16-Jul-1945 (74 y.o. [name] Primary Care Aloys Hupfer: [name] Other Clinician: Referring Soma Bachand: Treating Rogelio Winbush/Extender: Azucena Kuba in Treatment: 1 Encounter Discharge Information Items Post Procedure Vitals Discharge Condition: Stable Temperature (F): 98.4 Ambulatory Status: Wheelchair Pulse (bpm): 86 Discharge Destination: Home Respiratory Rate (breaths/min): 20 Transportation: Private Auto Blood Pressure (mmHg): 147/77 Accompanied By: self Schedule Follow-up Appointment: Yes Clinical Summary of Care: Patient Declined Electronic Signature(s) Signed: 02/10/2020 5:00:24 PM By: Carlene Coria RN Entered By: Carlene Coria on 02/08/2020 15:22:17 -------------------------------------------------------------------------------- Lower Extremity Assessment Details Patient Name: Date of Service: Pamela Pilon, DO NNA J. 02/08/2020 2:00 PM Medical Record Number: [id number] Patient Account Number: [id number] Date of Birth/Sex: Treating RN: 08-29-1945 (74 y.o. [name], Meta.Reding Primary Care Ryna Beckstrom: [name] Other Clinician: Referring Griffen Frayne: Treating Author Hatlestad/Extender: [name] Weeks in Treatment: 1 Edema Assessment Assessed: [Left: No] Patrice Paradise: Yes] Edema: [Left: N] [Right: o] Calf Left: Right: Point of Measurement: 43 cm From Medial Instep 29 cm Ankle Left: Right: Point of Measurement: 11 cm From Medial Instep 18.7 cm Electronic Signature(s) Signed: 02/08/2020 5:31:28 PM By: Deon Pilling Entered By: Deon Pilling on 02/08/2020 14:27:44 -------------------------------------------------------------------------------- [place] Details Patient Name: Date of Service: Pamela Pilon, DO NNA J. 02/08/2020 2:00 PM Medical Record Number: [id number] Patient Account Number: [id number] Date of Birth/Sex: Treating RN: 06-Jun-1945 (74 y.o. [name] Primary Care Baneza Bartoszek: [name] Other Clinician: Referring Clois Montavon: Treating  Margretta Zamorano/Extender: Azucena Kuba in Treatment: 1 Active Inactive Abuse / Safety / Falls / Self Care Management Nursing Diagnoses: Potential for falls Goals: Patient/caregiver will verbalize/demonstrate measure taken to improve self care Date Initiated: 02/08/2020 Target Resolution Date: 03/07/2020 Goal Status: Active Interventions: Assess fall risk on admission and as needed Notes: Tissue Oxygenation Nursing Diagnoses: Actual ineffective tissue perfusion; peripheral (select once diagnosis is confirmed) Knowledge deficit related to disease process and management Goals: Patient/caregiver will verbalize understanding  of disease process and disease management Date Initiated: 02/08/2020 Target Resolution Date: 03/07/2020 Goal Status: Active Interventions: Assess patient understanding of disease process and management upon diagnosis and as needed Assess peripheral arterial status upon admission and as needed Provide education on tissue oxygenation and ischemia Treatment Activities: T ordered outside of clinic : 02/08/2020 est Notes: Wound/Skin Impairment Nursing Diagnoses: Impaired tissue integrity Knowledge deficit related to ulceration/compromised skin integrity Goals: Patient/caregiver will verbalize understanding of skin care regimen Date Initiated: 02/08/2020 Target Resolution Date: 03/07/2020 Goal Status: Active Ulcer/skin breakdown will have a volume reduction of 30% by week 4 Date Initiated: 02/08/2020 Target Resolution Date: 03/07/2020 Goal Status: Active Interventions: Assess patient/caregiver ability to obtain necessary supplies Assess patient/caregiver ability to perform ulcer/skin care regimen upon admission and as needed Assess ulceration(s) every visit Provide education on ulcer and skin care Treatment Activities: Skin care regimen initiated : 02/08/2020 Topical wound management initiated : 02/08/2020 Notes: Electronic  Signature(s) Signed: 02/08/2020 5:51:45 PM By: Baruch Gouty RN, BSN Entered By: Baruch Gouty on 02/08/2020 14:48:06 -------------------------------------------------------------------------------- Pain Assessment Details Patient Name: Date of Service: Pamela Pilon, DO NNA J. 02/08/2020 2:00 PM Medical Record Number: [id number] Patient Account Number: [id number] Date of Birth/Sex: Treating RN: June 16, 1945 (74 y.o. Debby Bud Primary Care Aliesha Dolata: [name] Other Clinician: Referring Tkeyah Burkman: Treating Tremaine Fuhriman/Extender: Azucena Kuba in Treatment: 1 Active Problems Location of Pain Severity and Description of Pain Patient Has Paino No Site Locations Rate the pain. Rate the pain. Current Pain Level: 0 Pain Management and Medication Current Pain Management: Medication: No Cold Application: No Rest: No Massage: No Activity: No T.E.N.S.: No Heat Application: No Leg drop or elevation: No Is the Current Pain Management Adequate: Adequate How does your wound impact your activities of daily livingo Sleep: No Bathing: No Appetite: No Relationship With Others: No Bladder Continence: No Emotions: No Bowel Continence: No Work: No Toileting: No Drive: No Dressing: No Hobbies: No Electronic Signature(s) Signed: 02/08/2020 5:31:28 PM By: Deon Pilling Entered By: Deon Pilling on 02/08/2020 14:27:13 -------------------------------------------------------------------------------- Patient/Caregiver Education Details Patient Name: Date of Service: Pamela Pilon, DO NNA J. 11/3/2021andnbsp2:00 PM Medical Record Number: [id number] Patient Account Number: [id number] Date of Birth/Gender: Treating RN: 26-Mar-1946 (74 y.o. [name] Primary Care Physician: [name] Other Clinician: Referring Physician: Treating Physician/Extender: Azucena Kuba in Treatment: 1 Education Assessment Education  Provided To: Patient Education Topics Provided Tissue Oxygenation: Methods: Explain/Verbal Responses: Reinforcements needed, State content correctly Wound/Skin Impairment: Methods: Explain/Verbal Responses: Reinforcements needed, State content correctly Electronic Signature(s) Signed: 02/08/2020 5:51:45 PM By: Baruch Gouty RN, BSN Signed: 02/08/2020 5:51:45 PM By: Baruch Gouty RN, BSN Entered By: Baruch Gouty on 02/08/2020 14:49:10 -------------------------------------------------------------------------------- Wound Assessment Details Patient Name: Date of Service: Pamela Pilon, DO NNA J. 02/08/2020 2:00 PM Medical Record Number: [id number] Patient Account Number: [id number] Date of Birth/Sex: Treating RN: [birth year] (74 y.o. [name], Meta.Reding Primary Care Alfretta Pinch: [name] Other Clinician: Referring Corbitt Cloke: Treating Ranson Belluomini/Extender: Azucena Kuba in Treatment: 1 Wound Status Wound Number: 16 Primary Diabetic Wound/Ulcer of the Lower Extremity Etiology: Wound Location: Right, Distal, Lateral Foot Wound Open Wounding Event: Gradually Appeared Status: Date Acquired: 11/06/2019 Comorbid Cataracts, Anemia, Asthma, Congestive Heart Failure, Coronary Weeks Of Treatment: 1 History: Artery Disease, Hypertension, Peripheral Arterial Disease, Clustered Wound: No Peripheral Venous Disease, Type II Diabetes, End Stage Renal Disease, Osteoarthritis, Osteomyelitis, Neuropathy, Received Radiation Wound Measurements Length: (cm) 0.7 Width: (cm) 0.5 Depth: (cm) 0.1  Area: (cm) 0.275 Volume: (cm) 0.027 % Reduction in Area: 56.2% % Reduction in Volume: 57.1% Epithelialization: None Tunneling: No Undermining: No Wound Description Classification: Unable to visualize wound bed Wound Margin: Distinct, outline attached Exudate Amount: None Present Foul Odor After Cleansing: No Slough/Fibrino No Wound Bed Granulation Amount: None Present  (0%) Exposed Structure Necrotic Amount: Large (67-100%) Fascia Exposed: No Necrotic Quality: Eschar Fat Layer (Subcutaneous Tissue) Exposed: No Tendon Exposed: No Muscle Exposed: No Joint Exposed: No Bone Exposed: No Treatment Notes Wound #16 (Right, Distal, Lateral Foot) 1. Cleanse With Wound Cleanser 3. Primary Dressing Applied Calcium Alginate Ag 4. Secondary Dressing Dry Gauze Roll Gauze 5. Secured With Recruitment consultant) Signed: 02/08/2020 5:31:28 PM By: Deon Pilling Entered By: Deon Pilling on 02/08/2020 14:28:53 -------------------------------------------------------------------------------- Wound Assessment Details Patient Name: Date of Service: Pamela Pilon, DO NNA J. 02/08/2020 2:00 PM Medical Record Number: [id number] Patient Account Number: [id number] Date of Birth/Sex: Treating RN: [birth year] (74 y.o. [name], Meta.Reding Primary Care Jamesia Linnen: [name] Other Clinician: Referring Merrel Crabbe: Treating Emaline Karnes/Extender: Azucena Kuba in Treatment: 1 Wound Status Wound Number: 17 Primary Diabetic Wound/Ulcer of the Lower Extremity Etiology: Wound Location: Right, Plantar Foot Wound Open Wounding Event: Gradually Appeared Status: Date Acquired: 11/06/2018 Comorbid Cataracts, Anemia, Asthma, Congestive Heart Failure, Coronary Weeks Of Treatment: 1 History: Artery Disease, Hypertension, Peripheral Arterial Disease, Clustered Wound: No Peripheral Venous Disease, Type II Diabetes, End Stage Renal Disease, Osteoarthritis, Osteomyelitis, Neuropathy, Received Radiation Wound Measurements Length: (cm) 0.1 Width: (cm) 0.1 Depth: (cm) 0.5 Area: (cm) 0.008 Volume: (cm) 0.004 % Reduction in Area: 83% % Reduction in Volume: 78.9% Epithelialization: None Tunneling: No Undermining: No Wound Description Classification: Grade 2 Wound Margin: Epibole Exudate Amount: Small Exudate Type: Serous Exudate Color: amber Foul  Odor After Cleansing: No Slough/Fibrino No Wound Bed Granulation Amount: Large (67-100%) Exposed Structure Granulation Quality: Pink Fascia Exposed: No Necrotic Amount: None Present (0%) Fat Layer (Subcutaneous Tissue) Exposed: Yes Tendon Exposed: No Muscle Exposed: No Joint Exposed: No Bone Exposed: No Treatment Notes Wound #17 (Right, Plantar Foot) 1. Cleanse With Wound Cleanser 3. Primary Dressing Applied Calcium Alginate Ag 4. Secondary Dressing Dry Gauze Roll Gauze 5. Secured With Recruitment consultant) Signed: 02/08/2020 5:31:28 PM By: Deon Pilling Entered By: Deon Pilling on 02/08/2020 14:29:24 -------------------------------------------------------------------------------- Wound Assessment Details Patient Name: Date of Service: Pamela Pilon, DO NNA J. 02/08/2020 2:00 PM Medical Record Number: [id number] Patient Account Number: [id number] Date of Birth/Sex: Treating RN: July 01, 1945 (74 y.o. [name], Meta.Reding Primary Care Kimberlea Schlag: [name] Other Clinician: Referring Jenny Omdahl: Treating Tametra Ahart/Extender: Azucena Kuba in Treatment: 1 Wound Status Wound Number: 18 Primary Diabetic Wound/Ulcer of the Lower Extremity Etiology: Wound Location: Right, Proximal, Lateral Foot Wound Open Wounding Event: Gradually Appeared Status: Date Acquired: 11/06/2018 Comorbid Cataracts, Anemia, Asthma, Congestive Heart Failure, Coronary Weeks Of Treatment: 1 History: Artery Disease, Hypertension, Peripheral Arterial Disease, Clustered Wound: No Peripheral Venous Disease, Type II Diabetes, End Stage Renal Disease, Osteoarthritis, Osteomyelitis, Neuropathy, Received Radiation Wound Measurements Length: (cm) 4.6 Width: (cm) 1.4 Depth: (cm) 0.3 Area: (cm) 5.058 Volume: (cm) 1.517 % Reduction in Area: 0% % Reduction in Volume: 0% Epithelialization: None Tunneling: No Undermining: No Wound Description Classification: Unable to  visualize wound bed Wound Margin: Epibole Exudate Amount: Medium Exudate Type: Serosanguineous Exudate Color: red, brown Foul Odor After Cleansing: No Slough/Fibrino Yes Wound Bed Granulation Amount: Small (1-33%) Exposed Structure Granulation Quality: Pink Fascia Exposed: No Necrotic Amount: Large (67-100%) Fat  Layer (Subcutaneous Tissue) Exposed: No Necrotic Quality: Eschar, Adherent Slough Tendon Exposed: No Muscle Exposed: No Joint Exposed: No Bone Exposed: No Treatment Notes Wound #18 (Right, Proximal, Lateral Foot) 1. Cleanse With Wound Cleanser 3. Primary Dressing Applied Calcium Alginate Ag 4. Secondary Dressing Dry Gauze Roll Gauze 5. Secured With Recruitment consultant) Signed: 02/08/2020 5:31:28 PM By: Deon Pilling Entered By: Deon Pilling on 02/08/2020 14:30:23 -------------------------------------------------------------------------------- Vitals Details Patient Name: Date of Service: Pamela Pilon, DO NNA J. 02/08/2020 2:00 PM Medical Record Number: [id number] Patient Account Number: [id number] Date of Birth/Sex: Treating RN: [birth year] (74 y.o. [name], Meta.Reding Primary Care Donja Tipping: [name] Other Clinician: Referring Kymoni Lesperance: Treating Porschea Borys/Extender: Azucena Kuba in Treatment: 1 Vital Signs Time Taken: 14:12 Temperature (F): 98.4 Height (in): 64 Pulse (bpm): 86 Weight (lbs): 150 Respiratory Rate (breaths/min): 20 Body Mass Index (BMI): 25.7 Blood Pressure (mmHg): 147/77 Capillary Blood Glucose (mg/dl): 112 Reference Range: 80 - 120 mg / dl Electronic Signature(s) Signed: 02/08/2020 5:31:28 PM By: Deon Pilling Entered By: Deon Pilling on 02/08/2020 14:27:21

## 2020-02-11 DIAGNOSIS — N2581 Secondary hyperparathyroidism of renal origin: Secondary | ICD-10-CM | POA: Diagnosis not present

## 2020-02-11 DIAGNOSIS — D509 Iron deficiency anemia, unspecified: Secondary | ICD-10-CM | POA: Diagnosis not present

## 2020-02-11 DIAGNOSIS — Z992 Dependence on renal dialysis: Secondary | ICD-10-CM | POA: Diagnosis not present

## 2020-02-11 DIAGNOSIS — D631 Anemia in chronic kidney disease: Secondary | ICD-10-CM | POA: Diagnosis not present

## 2020-02-11 DIAGNOSIS — N186 End stage renal disease: Secondary | ICD-10-CM | POA: Diagnosis not present

## 2020-02-12 DIAGNOSIS — Z992 Dependence on renal dialysis: Secondary | ICD-10-CM | POA: Diagnosis not present

## 2020-02-12 DIAGNOSIS — I110 Hypertensive heart disease with heart failure: Secondary | ICD-10-CM | POA: Diagnosis not present

## 2020-02-12 DIAGNOSIS — E1140 Type 2 diabetes mellitus with diabetic neuropathy, unspecified: Secondary | ICD-10-CM | POA: Diagnosis not present

## 2020-02-12 DIAGNOSIS — N186 End stage renal disease: Secondary | ICD-10-CM | POA: Diagnosis not present

## 2020-02-12 DIAGNOSIS — L97412 Non-pressure chronic ulcer of right heel and midfoot with fat layer exposed: Secondary | ICD-10-CM | POA: Diagnosis not present

## 2020-02-12 DIAGNOSIS — I509 Heart failure, unspecified: Secondary | ICD-10-CM | POA: Diagnosis not present

## 2020-02-12 DIAGNOSIS — I2510 Atherosclerotic heart disease of native coronary artery without angina pectoris: Secondary | ICD-10-CM | POA: Diagnosis not present

## 2020-02-12 DIAGNOSIS — Z7982 Long term (current) use of aspirin: Secondary | ICD-10-CM | POA: Diagnosis not present

## 2020-02-12 DIAGNOSIS — Z794 Long term (current) use of insulin: Secondary | ICD-10-CM | POA: Diagnosis not present

## 2020-02-12 DIAGNOSIS — Z9181 History of falling: Secondary | ICD-10-CM | POA: Diagnosis not present

## 2020-02-12 DIAGNOSIS — E11621 Type 2 diabetes mellitus with foot ulcer: Secondary | ICD-10-CM | POA: Diagnosis not present

## 2020-02-12 DIAGNOSIS — E1151 Type 2 diabetes mellitus with diabetic peripheral angiopathy without gangrene: Secondary | ICD-10-CM | POA: Diagnosis not present

## 2020-02-12 DIAGNOSIS — Z7984 Long term (current) use of oral hypoglycemic drugs: Secondary | ICD-10-CM | POA: Diagnosis not present

## 2020-02-12 DIAGNOSIS — S91301D Unspecified open wound, right foot, subsequent encounter: Secondary | ICD-10-CM | POA: Diagnosis not present

## 2020-02-12 DIAGNOSIS — E1122 Type 2 diabetes mellitus with diabetic chronic kidney disease: Secondary | ICD-10-CM | POA: Diagnosis not present

## 2020-02-13 ENCOUNTER — Ambulatory Visit (INDEPENDENT_AMBULATORY_CARE_PROVIDER_SITE_OTHER): Payer: Medicare Other | Admitting: Adult Health

## 2020-02-13 ENCOUNTER — Other Ambulatory Visit: Payer: Self-pay

## 2020-02-13 ENCOUNTER — Encounter: Payer: Self-pay | Admitting: Adult Health

## 2020-02-13 VITALS — BP 140/68 | HR 73 | Temp 97.9°F | Ht 64.0 in | Wt 155.0 lb

## 2020-02-13 DIAGNOSIS — E1169 Type 2 diabetes mellitus with other specified complication: Secondary | ICD-10-CM

## 2020-02-13 DIAGNOSIS — Z79899 Other long term (current) drug therapy: Secondary | ICD-10-CM

## 2020-02-13 DIAGNOSIS — Z136 Encounter for screening for cardiovascular disorders: Secondary | ICD-10-CM | POA: Diagnosis not present

## 2020-02-13 DIAGNOSIS — M109 Gout, unspecified: Secondary | ICD-10-CM

## 2020-02-13 DIAGNOSIS — R9431 Abnormal electrocardiogram [ECG] [EKG]: Secondary | ICD-10-CM

## 2020-02-13 DIAGNOSIS — I5042 Chronic combined systolic (congestive) and diastolic (congestive) heart failure: Secondary | ICD-10-CM

## 2020-02-13 DIAGNOSIS — I739 Peripheral vascular disease, unspecified: Secondary | ICD-10-CM

## 2020-02-13 DIAGNOSIS — D631 Anemia in chronic kidney disease: Secondary | ICD-10-CM | POA: Diagnosis not present

## 2020-02-13 DIAGNOSIS — E441 Mild protein-calorie malnutrition: Secondary | ICD-10-CM | POA: Diagnosis not present

## 2020-02-13 DIAGNOSIS — Z1159 Encounter for screening for other viral diseases: Secondary | ICD-10-CM

## 2020-02-13 DIAGNOSIS — N186 End stage renal disease: Secondary | ICD-10-CM | POA: Diagnosis not present

## 2020-02-13 DIAGNOSIS — Z0001 Encounter for general adult medical examination with abnormal findings: Secondary | ICD-10-CM

## 2020-02-13 DIAGNOSIS — Z23 Encounter for immunization: Secondary | ICD-10-CM | POA: Diagnosis not present

## 2020-02-13 DIAGNOSIS — Z170 Estrogen receptor positive status [ER+]: Secondary | ICD-10-CM

## 2020-02-13 DIAGNOSIS — Z0000 Encounter for general adult medical examination without abnormal findings: Secondary | ICD-10-CM

## 2020-02-13 DIAGNOSIS — E559 Vitamin D deficiency, unspecified: Secondary | ICD-10-CM

## 2020-02-13 DIAGNOSIS — Z992 Dependence on renal dialysis: Secondary | ICD-10-CM

## 2020-02-13 DIAGNOSIS — D689 Coagulation defect, unspecified: Secondary | ICD-10-CM

## 2020-02-13 DIAGNOSIS — E785 Hyperlipidemia, unspecified: Secondary | ICD-10-CM

## 2020-02-13 DIAGNOSIS — Z89422 Acquired absence of other left toe(s): Secondary | ICD-10-CM

## 2020-02-13 DIAGNOSIS — Z794 Long term (current) use of insulin: Secondary | ICD-10-CM | POA: Diagnosis not present

## 2020-02-13 DIAGNOSIS — E1142 Type 2 diabetes mellitus with diabetic polyneuropathy: Secondary | ICD-10-CM

## 2020-02-13 DIAGNOSIS — E119 Type 2 diabetes mellitus without complications: Secondary | ICD-10-CM

## 2020-02-13 DIAGNOSIS — E538 Deficiency of other specified B group vitamins: Secondary | ICD-10-CM

## 2020-02-13 DIAGNOSIS — E663 Overweight: Secondary | ICD-10-CM

## 2020-02-13 DIAGNOSIS — Z1329 Encounter for screening for other suspected endocrine disorder: Secondary | ICD-10-CM

## 2020-02-13 DIAGNOSIS — I10 Essential (primary) hypertension: Secondary | ICD-10-CM

## 2020-02-13 DIAGNOSIS — I25810 Atherosclerosis of coronary artery bypass graft(s) without angina pectoris: Secondary | ICD-10-CM | POA: Diagnosis not present

## 2020-02-13 DIAGNOSIS — Z95828 Presence of other vascular implants and grafts: Secondary | ICD-10-CM

## 2020-02-13 DIAGNOSIS — E11319 Type 2 diabetes mellitus with unspecified diabetic retinopathy without macular edema: Secondary | ICD-10-CM

## 2020-02-13 DIAGNOSIS — E0822 Diabetes mellitus due to underlying condition with diabetic chronic kidney disease: Secondary | ICD-10-CM | POA: Diagnosis not present

## 2020-02-13 LAB — TROPONIN I: Troponin I: 76 ng/L

## 2020-02-13 NOTE — Patient Instructions (Addendum)
  [name] , Thank you for taking time to come for your Annual Wellness Visit. I appreciate your ongoing commitment to your health goals. Please review the following plan we discussed and let me know if I can assist you in the future.   This is a list of the screening recommended for you and due dates:  Health Maintenance  Topic Date Due  .  Hepatitis C: One time screening is recommended by Center for Disease Control  (CDC) for  adults born from [birth year] through 1965.   Never done  . COVID-19 Vaccine (1) Never done  . Flu Shot  11/06/2019  . Complete foot exam   01/09/2020  . Colon Cancer Screening  03/08/2020  . Hemoglobin A1C  04/03/2020  . Mammogram  09/11/2020  . Eye exam for diabetics  02/12/2021  . Tetanus Vaccine  01/12/2022  . DEXA scan (bone density measurement)  Completed  . Pneumonia vaccines  Completed     Check with insurance about shingrix vaccine - if they cover, can get at pharmacy   Please schedule dental appointment -   Please have covid 19 card ready tomorrow morning  Please have fax number and provider name for dialysis clinic ready tomorrow morning

## 2020-02-14 DIAGNOSIS — D509 Iron deficiency anemia, unspecified: Secondary | ICD-10-CM | POA: Diagnosis not present

## 2020-02-14 DIAGNOSIS — N186 End stage renal disease: Secondary | ICD-10-CM | POA: Diagnosis not present

## 2020-02-14 DIAGNOSIS — Z992 Dependence on renal dialysis: Secondary | ICD-10-CM | POA: Diagnosis not present

## 2020-02-14 DIAGNOSIS — D631 Anemia in chronic kidney disease: Secondary | ICD-10-CM | POA: Diagnosis not present

## 2020-02-14 DIAGNOSIS — N2581 Secondary hyperparathyroidism of renal origin: Secondary | ICD-10-CM | POA: Diagnosis not present

## 2020-02-14 LAB — MICROALBUMIN / CREATININE URINE RATIO
Creatinine, Urine: 73 mg/dL (ref 20–275)
Microalb Creat Ratio: 1527 mcg/mg creat — ABNORMAL HIGH (ref <30)
Microalb, Ur: 111.5 mg/dL

## 2020-02-14 LAB — CBC WITH DIFFERENTIAL/PLATELET
Absolute Monocytes: 767 cells/uL (ref 200–950)
Basophils Absolute: 22 cells/uL (ref 0–200)
Basophils Relative: 0.3 %
Eosinophils Absolute: 80 cells/uL (ref 15–500)
Eosinophils Relative: 1.1 %
HCT: 38.4 % (ref 35.0–45.0)
Hemoglobin: 12.6 g/dL (ref 11.7–15.5)
Lymphs Abs: 1278 cells/uL (ref 850–3900)
MCH: 32.4 pg (ref 27.0–33.0)
MCHC: 32.8 g/dL (ref 32.0–36.0)
MCV: 98.7 fL (ref 80.0–100.0)
MPV: 12 fL (ref 7.5–12.5)
Monocytes Relative: 10.5 %
Neutro Abs: 5154 cells/uL (ref 1500–7800)
Neutrophils Relative %: 70.6 %
Platelets: 171 10*3/uL (ref 140–400)
RBC: 3.89 10*6/uL (ref 3.80–5.10)
RDW: 13.5 % (ref 11.0–15.0)
Total Lymphocyte: 17.5 %
WBC: 7.3 10*3/uL (ref 3.8–10.8)

## 2020-02-14 LAB — COMPLETE METABOLIC PANEL WITH GFR
AG Ratio: 2 (calc) (ref 1.0–2.5)
ALT: 12 U/L (ref 6–29)
AST: 15 U/L (ref 10–35)
Albumin: 4.3 g/dL (ref 3.6–5.1)
Alkaline phosphatase (APISO): 154 U/L — ABNORMAL HIGH (ref 37–153)
BUN/Creatinine Ratio: 11 (calc) (ref 6–22)
BUN: 54 mg/dL — ABNORMAL HIGH (ref 7–25)
CO2: 33 mmol/L — ABNORMAL HIGH (ref 20–32)
Calcium: 10.3 mg/dL (ref 8.6–10.4)
Chloride: 94 mmol/L — ABNORMAL LOW (ref 98–110)
Creat: 5.12 mg/dL — ABNORMAL HIGH (ref 0.60–0.93)
GFR, Est African American: 9 mL/min/{1.73_m2} — ABNORMAL LOW (ref >=60)
GFR, Est Non African American: 8 mL/min/{1.73_m2} — ABNORMAL LOW (ref >=60)
Globulin: 2.2 g/dL (calc) (ref 1.9–3.7)
Glucose, Bld: 118 mg/dL — ABNORMAL HIGH (ref 65–99)
Potassium: 4 mmol/L (ref 3.5–5.3)
Sodium: 140 mmol/L (ref 135–146)
Total Bilirubin: 0.7 mg/dL (ref 0.2–1.2)
Total Protein: 6.5 g/dL (ref 6.1–8.1)

## 2020-02-14 LAB — URINALYSIS, ROUTINE W REFLEX MICROSCOPIC
Bilirubin Urine: NEGATIVE
Glucose, UA: NEGATIVE
Hyaline Cast: NONE SEEN /LPF
Ketones, ur: NEGATIVE
Nitrite: NEGATIVE
RBC / HPF: >=60 /HPF — AB (ref 0–2)
Specific Gravity, Urine: 1.015 (ref 1.001–1.03)
pH: 6.5 (ref 5.0–8.0)

## 2020-02-14 LAB — LIPID PANEL
Cholesterol: 90 mg/dL (ref <200)
HDL: 40 mg/dL — ABNORMAL LOW (ref >=50)
LDL Cholesterol (Calc): 30 mg/dL (calc)
Non-HDL Cholesterol (Calc): 50 mg/dL (calc) (ref <130)
Total CHOL/HDL Ratio: 2.3 (calc) (ref <5.0)
Triglycerides: 114 mg/dL (ref <150)

## 2020-02-14 LAB — TSH: TSH: 1.59 mIU/L (ref 0.40–4.50)

## 2020-02-14 LAB — VITAMIN B12: Vitamin B-12: 1084 pg/mL (ref 200–1100)

## 2020-02-14 LAB — MAGNESIUM: Magnesium: 2.7 mg/dL — ABNORMAL HIGH (ref 1.5–2.5)

## 2020-02-14 LAB — URIC ACID: Uric Acid, Serum: 4.4 mg/dL (ref 2.5–7.0)

## 2020-02-14 LAB — HEMOGLOBIN A1C
Hgb A1c MFr Bld: 5.4 % of total Hgb (ref <5.7)
Mean Plasma Glucose: 108 (calc)
eAG (mmol/L): 6 (calc)

## 2020-02-14 LAB — HEPATITIS C ANTIBODY
Hepatitis C Ab: NONREACTIVE
SIGNAL TO CUT-OFF: 0.09 (ref <1.00)

## 2020-02-14 NOTE — Progress Notes (Signed)
Patient aware of lab results. She states that she will be back in touch with [name] with the fax number to [facility] to send her A1c results as well as supplying [name] with the lot number for her Covid Vaccine. Marcelino Scot

## 2020-02-15 ENCOUNTER — Other Ambulatory Visit (HOSPITAL_COMMUNITY): Payer: Medicare Other

## 2020-02-15 DIAGNOSIS — E11621 Type 2 diabetes mellitus with foot ulcer: Secondary | ICD-10-CM | POA: Diagnosis not present

## 2020-02-15 DIAGNOSIS — I2510 Atherosclerotic heart disease of native coronary artery without angina pectoris: Secondary | ICD-10-CM | POA: Diagnosis not present

## 2020-02-15 DIAGNOSIS — S91301D Unspecified open wound, right foot, subsequent encounter: Secondary | ICD-10-CM | POA: Diagnosis not present

## 2020-02-15 DIAGNOSIS — L97412 Non-pressure chronic ulcer of right heel and midfoot with fat layer exposed: Secondary | ICD-10-CM | POA: Diagnosis not present

## 2020-02-15 DIAGNOSIS — E1151 Type 2 diabetes mellitus with diabetic peripheral angiopathy without gangrene: Secondary | ICD-10-CM | POA: Diagnosis not present

## 2020-02-15 DIAGNOSIS — E1140 Type 2 diabetes mellitus with diabetic neuropathy, unspecified: Secondary | ICD-10-CM | POA: Diagnosis not present

## 2020-02-16 DIAGNOSIS — N2581 Secondary hyperparathyroidism of renal origin: Secondary | ICD-10-CM | POA: Diagnosis not present

## 2020-02-16 DIAGNOSIS — D509 Iron deficiency anemia, unspecified: Secondary | ICD-10-CM | POA: Diagnosis not present

## 2020-02-16 DIAGNOSIS — N186 End stage renal disease: Secondary | ICD-10-CM | POA: Diagnosis not present

## 2020-02-16 DIAGNOSIS — Z992 Dependence on renal dialysis: Secondary | ICD-10-CM | POA: Diagnosis not present

## 2020-02-16 DIAGNOSIS — D631 Anemia in chronic kidney disease: Secondary | ICD-10-CM | POA: Diagnosis not present

## 2020-02-17 ENCOUNTER — Other Ambulatory Visit: Payer: Self-pay

## 2020-02-17 ENCOUNTER — Ambulatory Visit (HOSPITAL_COMMUNITY)
Admission: RE | Admit: 2020-02-17 | Discharge: 2020-02-17 | Disposition: A | Discharge status: Home / Self Care | Payer: Medicare Other | Source: Ambulatory Visit | Attending: Vascular Surgery | Admitting: Vascular Surgery

## 2020-02-17 DIAGNOSIS — I739 Peripheral vascular disease, unspecified: Secondary | ICD-10-CM

## 2020-02-18 DIAGNOSIS — D631 Anemia in chronic kidney disease: Secondary | ICD-10-CM | POA: Diagnosis not present

## 2020-02-18 DIAGNOSIS — D509 Iron deficiency anemia, unspecified: Secondary | ICD-10-CM | POA: Diagnosis not present

## 2020-02-18 DIAGNOSIS — Z992 Dependence on renal dialysis: Secondary | ICD-10-CM | POA: Diagnosis not present

## 2020-02-18 DIAGNOSIS — N2581 Secondary hyperparathyroidism of renal origin: Secondary | ICD-10-CM | POA: Diagnosis not present

## 2020-02-18 DIAGNOSIS — N186 End stage renal disease: Secondary | ICD-10-CM | POA: Diagnosis not present

## 2020-02-20 ENCOUNTER — Encounter: Payer: Self-pay | Admitting: Vascular Surgery

## 2020-02-20 ENCOUNTER — Other Ambulatory Visit: Payer: Self-pay

## 2020-02-20 ENCOUNTER — Ambulatory Visit (INDEPENDENT_AMBULATORY_CARE_PROVIDER_SITE_OTHER): Payer: Medicare Other | Admitting: Vascular Surgery

## 2020-02-20 VITALS — BP 132/82 | HR 78 | Temp 97.8°F | Resp 18 | Ht 64.0 in | Wt 219.4 lb

## 2020-02-20 DIAGNOSIS — N186 End stage renal disease: Secondary | ICD-10-CM

## 2020-02-20 DIAGNOSIS — Z992 Dependence on renal dialysis: Secondary | ICD-10-CM

## 2020-02-20 DIAGNOSIS — I25810 Atherosclerosis of coronary artery bypass graft(s) without angina pectoris: Secondary | ICD-10-CM | POA: Diagnosis not present

## 2020-02-20 DIAGNOSIS — I739 Peripheral vascular disease, unspecified: Secondary | ICD-10-CM

## 2020-02-20 NOTE — H&P (View-Only) (Signed)
[facility]  Patient name: [name] MRN: [id number] DOB: 1945/06/20 Sex: female  REASON FOR VISIT: Evaluation nonhealing ulceration right lateral foot  HPI: [name] is a 74 y.o. female today for evaluation of nonhealing ulceration on her right lateral foot.  She is well-known to me from prior left leg surgery.  She had presented approximately 1 year ago with gangrene of her toes of her left foot.  She underwent arteriography on 03/25/2019 and this revealed bilateral superficial femoral artery occlusions.  She underwent left femoral to below-knee popliteal bypass with saphenous vein.  She underwent toe amputation with Dr. Sharol Given and eventually had healing of all of this.  She has done well from the standpoint of her left leg.  She does have known severe disease in her right leg as well.  She now has developed ulceration over the lateral aspect of her right foot.  She is initiated care at the wound center and is seen here today for evaluation of adequacy of healing.  She does have some discomfort associated with this.  She is on chronic hemodialysis.  She is currently in a wheelchair trying to minimize walking on her right foot.  Past Medical History:  Diagnosis Date  . Allergy   . Anterior dislocation of left shoulder 05/27/2019  . Arthritis    "maybe in my lower back" (08/27/2012)  . 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 Chronic combined systolic and diastolic CHF (congestive heart failure) ([place])   . CKD (chronic kidney disease), stage IV ([place])   . Coronary artery disease    a. NSTEMI in 05/2009 s/p CABG (LIMA-LAD, SVG-diagonal, SVG-OM1/OM 2).   Marland Kitchen Dyspnea    with exertion  . Family history of anesthesia complication    Mom has a hard time to wake up  . Foot ulcer ([place])    "I've had them on both feet" (08/27/2012)  . Gouty arthritis    "right index finger"  (08/27/2012)  . History of radiation therapy 06/09/13-07/06/13   left breast 50Gy  . Hyperlipidemia   . Hypertension   . Infection    right second toe  . Ischemic cardiomyopathy    a. 2011: EF 40-45%. b. EF 55-60% in 04/2017 but shortly after as inpatient was 45-50%.  Marland Kitchen LBBB (left bundle branch block)   . Mitral regurgitation   . Neuropathy    Hx; of B/L feet  . NSTEMI (non-ST elevated myocardial infarction) ([place]) 05/23/2009  . Osteomyelitis of foot ([place])   . PAD (peripheral artery disease) ([facility])    a. LE PAD (patient previously elected hold off L fem-pop), prev followed by Dr. Bridgett Larsson  . PONV (postoperative nausea and vomiting)   . Sinus headache    "occasionally" (08/27/2012)  . Type II diabetes mellitus ([place])   . Vitamin D deficiency     Family History  Problem Relation Age of Onset  . Kidney cancer Brother 57  . Hypertension Brother   . Breast cancer Sister 44       LCIS; BRCA negative  . Throat cancer Father 61       smoker  . Breast cancer Sister 18       Lobular breast cancer  . Breast cancer Maternal Aunt        dx in her 21s    SOCIAL HISTORY: Social History   Tobacco Use  . Smoking status: Never Smoker  . Smokeless tobacco:  Never Used  Substance Use Topics  . Alcohol use: Not Currently    Comment: rarely drinks wine    Allergies  Allergen Reactions  . Atenolol Rash and Other (See Comments)    Exacerbates gout   . Adhesive [Tape] Other (See Comments)    SKIN IS VERY SENSITIVE AND BRUISES AND TEARS EASILY; PLEASE USE AN ALTERNATIVE TO TAPE!!  . Contrast Media [Iodinated Diagnostic Agents] Rash  . Iohexol Rash  . Latex Rash    Current Outpatient Medications  Medication Sig Dispense Refill  . allopurinol (ZYLOPRIM) 100 MG tablet TAKE 1 TABLET TWICE DAILY TO PREVENT GOUT 180 tablet 3  . ARTIFICIAL TEAR SOLUTION OP Place 1 drop into both eyes in the morning and at bedtime.    . aspirin EC 81 MG tablet Take 81 mg by mouth as needed. Alternates with Excedrin     . aspirin-acetaminophen-caffeine (EXCEDRIN EXTRA STRENGTH) 250-250-65 MG tablet Take 1 tablet by mouth 2 (two) times daily as needed (pain).    . atorvastatin (LIPITOR) 10 MG tablet Take 1 tablet daily for cholesterol. 90 tablet 1  . b complex-vitamin c-folic acid (NEPHRO-VITE) 0.8 MG TABS tablet Take 1 tablet by mouth daily.     . Bisacodyl (LAXATIVE PO) Take 1 capsule by mouth 3 (three) times daily.    . cinacalcet (SENSIPAR) 30 MG tablet Take 1 tablet (30 mg total) by mouth daily with supper. 60 tablet 1  . cyclobenzaprine (FLEXERIL) 5 MG tablet Take 1 tablet (5 mg total) by mouth 3 (three) times daily as needed for muscle spasms. 60 tablet 1  . Darbepoetin Alfa (ARANESP) 100 MCG/0.5ML SOSY injection Inject 0.5 mLs (100 mcg total) into the vein every Tuesday with hemodialysis. 4.2 mL   . dextrose 50 % solution Dextrose 50%    . docusate sodium (COLACE) 100 MG capsule Take 200 mg by mouth 2 (two) times daily.     . doxercalciferol (HECTOROL) 4 MCG/2ML injection Inject 2 mLs (4 mcg total) into the vein Every Tuesday,Thursday,and Saturday with dialysis. 2 mL   . doxycycline (VIBRA-TABS) 100 MG tablet Take 1 tablet (100 mg total) by mouth 2 (two) times daily. 14 tablet 0  . ferric gluconate 125 mg in sodium chloride 0.9 % 100 mL Inject 125 mg into the vein Every Tuesday,Thursday,and Saturday with dialysis.    . glucose blood (PRODIGY NO CODING BLOOD GLUC) test strip USE 1 STRIP TO CHECK GLUCOSE THREE TIMES DAILY-DX-E11.9 250 each 0  . Homeopathic Products (THERAWORX RELIEF EX) Apply 1 application topically daily as needed (pain).    . hydrALAZINE (APRESOLINE) 25 MG tablet Take     3 tablets (75 mg)      3 x /day        for BP & Heart 810 tablet 0  . HYDROcodone-acetaminophen (NORCO/VICODIN) 5-325 MG tablet Take 1 tablet by mouth every 6 (six) hours as needed. 10 tablet 0  . insulin NPH-regular Human (70-30) 100 UNIT/ML injection Inject 10-30 Units into the skin 2 (two) times daily with a meal.       . Insulin Pen Needle (BD PEN NEEDLE NANO U/F) 32G X 4 MM MISC USE TO INJECT TWICE DAILY LANTUS INJECTIONS 180 each 3  . isosorbide dinitrate (ISORDIL) 10 MG tablet TAKE 1 TABLET THREE TIMES A DAY FOR BLOOD PRESSURE AND HEART 270 tablet 3  . Methoxy PEG-Epoetin Beta (MIRCERA IJ) Mircera    . Multiple Vitamins-Minerals (PRESERVISION AREDS 2 PO) Take 2 capsules by mouth daily at 12   noon.     . predniSONE (DELTASONE) 20 MG tablet     . pregabalin (LYRICA) 100 MG capsule Take 1  Capsule    2 to 3 x   /day if needed for Sciatica pain 90 capsule 0  . Probiotic Product (PHILLIPS COLON HEALTH) CAPS Take 1 capsule by mouth in the morning and at bedtime.    . SANTYL ointment     . sevelamer carbonate (RENVELA) 800 MG tablet Take 2 tablets (1,600 mg total) by mouth 3 (three) times daily with meals. 90 tablet 1  . traZODone (DESYREL) 50 MG tablet Take 50 mg by mouth at bedtime as needed for sleep.     No current facility-administered medications for this visit.    REVIEW OF SYSTEMS:  [X] denotes positive finding, [ ] denotes negative finding Cardiac  Comments:  Chest pain or chest pressure:    Shortness of breath upon exertion:    Short of breath when lying flat:    Irregular heart rhythm:        Vascular    Pain in calf, thigh, or hip brought on by ambulation:    Pain in feet at night that wakes you up from your sleep:     Blood clot in your veins:    Leg swelling:           PHYSICAL EXAM: Vitals:   02/20/20 1335  BP: 132/82  Pulse: 78  Resp: 18  Temp: 97.8 F (36.6 C)  TempSrc: Other (Comment)  SpO2: 92%  Weight: 219 lb 5.7 oz (99.5 kg)  Height: 5' 4" (1.626 m)    GENERAL: The patient is a well-nourished female, in no acute distress. The vital signs are documented above. CARDIOVASCULAR: She does have a palpable right femoral pulse but absent distal pulses PULMONARY: There is good air exchange  MUSCULOSKELETAL: There are no major deformities or cyanosis. NEUROLOGIC: No focal  weakness or paresthesias are detected. SKIN: She has a long linear open ulceration over the lateral aspect of her right foot.  This appears to have very poor healing at the base.  Her metatarsal bone appears to be just under the surface although it is not obviously exposed PSYCHIATRIC: The patient has a normal affect.  DATA:  Recent noninvasive studies on 02/17/2020 reveal ankle arm index of 0.46 on the right which were dampened and monophasic.  On the left she had calcified vessels but did have biphasic flow  I reviewed her CT scan from 03/25/2019.  At that time she had a complete occlusion at its origin of her superficial femoral artery with reconstitution of a diseased below-knee popliteal artery and single-vessel runoff via anterior tibial artery into the foot  MEDICAL ISSUES: Critical limb ischemia right foot.  I am quite concerned regarding her extent of tissue loss and explained that we may not be able to salvage her foot and may end up with amputation.  I have recommended arteriography for further evaluation.  She has had prior right vein harvest and therefore would have to have a prosthetic with much less durability to her diseased outflow arteries.  We will plan arteriography on a nondialysis day and make further recommendations regarding revascularization.  She will continue working with the wound center    Kimberlea Schlag F. Devarious Pavek, MD FACS [facility] Office Tel [phone]   

## 2020-02-20 NOTE — H&P (View-Only) (Signed)
[facility]  Patient name: [name] MRN: [id number] DOB: 1945/06/20 Sex: female  REASON FOR VISIT: Evaluation nonhealing ulceration right lateral foot  HPI: [name] is a 74 y.o. female today for evaluation of nonhealing ulceration on her right lateral foot.  She is well-known to me from prior left leg surgery.  She had presented approximately 1 year ago with gangrene of her toes of her left foot.  She underwent arteriography on 03/25/2019 and this revealed bilateral superficial femoral artery occlusions.  She underwent left femoral to below-knee popliteal bypass with saphenous vein.  She underwent toe amputation with Dr. Sharol Given and eventually had healing of all of this.  She has done well from the standpoint of her left leg.  She does have known severe disease in her right leg as well.  She now has developed ulceration over the lateral aspect of her right foot.  She is initiated care at the wound center and is seen here today for evaluation of adequacy of healing.  She does have some discomfort associated with this.  She is on chronic hemodialysis.  She is currently in a wheelchair trying to minimize walking on her right foot.  Past Medical History:  Diagnosis Date  . Allergy   . Anterior dislocation of left shoulder 05/27/2019  . Arthritis    "maybe in my lower back" (08/27/2012)  . 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 Chronic combined systolic and diastolic CHF (congestive heart failure) ([place])   . CKD (chronic kidney disease), stage IV ([place])   . Coronary artery disease    a. NSTEMI in 05/2009 s/p CABG (LIMA-LAD, SVG-diagonal, SVG-OM1/OM 2).   Marland Kitchen Dyspnea    with exertion  . Family history of anesthesia complication    Mom has a hard time to wake up  . Foot ulcer ([place])    "I've had them on both feet" (08/27/2012)  . Gouty arthritis    "right index finger"  (08/27/2012)  . History of radiation therapy 06/09/13-07/06/13   left breast 50Gy  . Hyperlipidemia   . Hypertension   . Infection    right second toe  . Ischemic cardiomyopathy    a. 2011: EF 40-45%. b. EF 55-60% in 04/2017 but shortly after as inpatient was 45-50%.  Marland Kitchen LBBB (left bundle branch block)   . Mitral regurgitation   . Neuropathy    Hx; of B/L feet  . NSTEMI (non-ST elevated myocardial infarction) ([place]) 05/23/2009  . Osteomyelitis of foot ([place])   . PAD (peripheral artery disease) ([facility])    a. LE PAD (patient previously elected hold off L fem-pop), prev followed by Dr. Bridgett Larsson  . PONV (postoperative nausea and vomiting)   . Sinus headache    "occasionally" (08/27/2012)  . Type II diabetes mellitus ([place])   . Vitamin D deficiency     Family History  Problem Relation Age of Onset  . Kidney cancer Brother 57  . Hypertension Brother   . Breast cancer Sister 44       LCIS; BRCA negative  . Throat cancer Father 61       smoker  . Breast cancer Sister 18       Lobular breast cancer  . Breast cancer Maternal Aunt        dx in her 21s    SOCIAL HISTORY: Social History   Tobacco Use  . Smoking status: Never Smoker  . Smokeless tobacco:  Never Used  Substance Use Topics  . Alcohol use: Not Currently    Comment: rarely drinks wine    Allergies  Allergen Reactions  . Atenolol Rash and Other (See Comments)    Exacerbates gout   . Adhesive [Tape] Other (See Comments)    SKIN IS VERY SENSITIVE AND BRUISES AND TEARS EASILY; PLEASE USE AN ALTERNATIVE TO TAPE!!  . Contrast Media [Iodinated Diagnostic Agents] Rash  . Iohexol Rash  . Latex Rash    Current Outpatient Medications  Medication Sig Dispense Refill  . allopurinol (ZYLOPRIM) 100 MG tablet TAKE 1 TABLET TWICE DAILY TO PREVENT GOUT 180 tablet 3  . ARTIFICIAL TEAR SOLUTION OP Place 1 drop into both eyes in the morning and at bedtime.    . aspirin EC 81 MG tablet Take 81 mg by mouth as needed. Alternates with Excedrin     . aspirin-acetaminophen-caffeine (EXCEDRIN EXTRA STRENGTH) 250-250-65 MG tablet Take 1 tablet by mouth 2 (two) times daily as needed (pain).    . atorvastatin (LIPITOR) 10 MG tablet Take 1 tablet daily for cholesterol. 90 tablet 1  . b complex-vitamin c-folic acid (NEPHRO-VITE) 0.8 MG TABS tablet Take 1 tablet by mouth daily.     . Bisacodyl (LAXATIVE PO) Take 1 capsule by mouth 3 (three) times daily.    . cinacalcet (SENSIPAR) 30 MG tablet Take 1 tablet (30 mg total) by mouth daily with supper. 60 tablet 1  . cyclobenzaprine (FLEXERIL) 5 MG tablet Take 1 tablet (5 mg total) by mouth 3 (three) times daily as needed for muscle spasms. 60 tablet 1  . Darbepoetin Alfa (ARANESP) 100 MCG/0.5ML SOSY injection Inject 0.5 mLs (100 mcg total) into the vein every Tuesday with hemodialysis. 4.2 mL   . dextrose 50 % solution Dextrose 50%    . docusate sodium (COLACE) 100 MG capsule Take 200 mg by mouth 2 (two) times daily.     . doxercalciferol (HECTOROL) 4 MCG/2ML injection Inject 2 mLs (4 mcg total) into the vein Every Tuesday,Thursday,and Saturday with dialysis. 2 mL   . doxycycline (VIBRA-TABS) 100 MG tablet Take 1 tablet (100 mg total) by mouth 2 (two) times daily. 14 tablet 0  . ferric gluconate 125 mg in sodium chloride 0.9 % 100 mL Inject 125 mg into the vein Every Tuesday,Thursday,and Saturday with dialysis.    . glucose blood (PRODIGY NO CODING BLOOD GLUC) test strip USE 1 STRIP TO CHECK GLUCOSE THREE TIMES DAILY-DX-E11.9 250 each 0  . Homeopathic Products (THERAWORX RELIEF EX) Apply 1 application topically daily as needed (pain).    . hydrALAZINE (APRESOLINE) 25 MG tablet Take     3 tablets (75 mg)      3 x /day        for BP & Heart 810 tablet 0  . HYDROcodone-acetaminophen (NORCO/VICODIN) 5-325 MG tablet Take 1 tablet by mouth every 6 (six) hours as needed. 10 tablet 0  . insulin NPH-regular Human (70-30) 100 UNIT/ML injection Inject 10-30 Units into the skin 2 (two) times daily with a meal.       . Insulin Pen Needle (BD PEN NEEDLE NANO U/F) 32G X 4 MM MISC USE TO INJECT TWICE DAILY LANTUS INJECTIONS 180 each 3  . isosorbide dinitrate (ISORDIL) 10 MG tablet TAKE 1 TABLET THREE TIMES A DAY FOR BLOOD PRESSURE AND HEART 270 tablet 3  . Methoxy PEG-Epoetin Beta (MIRCERA IJ) Mircera    . Multiple Vitamins-Minerals (PRESERVISION AREDS 2 PO) Take 2 capsules by mouth daily at 12   noon.     . predniSONE (DELTASONE) 20 MG tablet     . pregabalin (LYRICA) 100 MG capsule Take 1  Capsule    2 to 3 x   /day if needed for Sciatica pain 90 capsule 0  . Probiotic Product (PHILLIPS COLON HEALTH) CAPS Take 1 capsule by mouth in the morning and at bedtime.    . SANTYL ointment     . sevelamer carbonate (RENVELA) 800 MG tablet Take 2 tablets (1,600 mg total) by mouth 3 (three) times daily with meals. 90 tablet 1  . traZODone (DESYREL) 50 MG tablet Take 50 mg by mouth at bedtime as needed for sleep.     No current facility-administered medications for this visit.    REVIEW OF SYSTEMS:  [X] denotes positive finding, [ ] denotes negative finding Cardiac  Comments:  Chest pain or chest pressure:    Shortness of breath upon exertion:    Short of breath when lying flat:    Irregular heart rhythm:        Vascular    Pain in calf, thigh, or hip brought on by ambulation:    Pain in feet at night that wakes you up from your sleep:     Blood clot in your veins:    Leg swelling:           PHYSICAL EXAM: Vitals:   02/20/20 1335  BP: 132/82  Pulse: 78  Resp: 18  Temp: 97.8 F (36.6 C)  TempSrc: Other (Comment)  SpO2: 92%  Weight: 219 lb 5.7 oz (99.5 kg)  Height: 5' 4" (1.626 m)    GENERAL: The patient is a well-nourished female, in no acute distress. The vital signs are documented above. CARDIOVASCULAR: She does have a palpable right femoral pulse but absent distal pulses PULMONARY: There is good air exchange  MUSCULOSKELETAL: There are no major deformities or cyanosis. NEUROLOGIC: No focal  weakness or paresthesias are detected. SKIN: She has a long linear open ulceration over the lateral aspect of her right foot.  This appears to have very poor healing at the base.  Her metatarsal bone appears to be just under the surface although it is not obviously exposed PSYCHIATRIC: The patient has a normal affect.  DATA:  Recent noninvasive studies on 02/17/2020 reveal ankle arm index of 0.46 on the right which were dampened and monophasic.  On the left she had calcified vessels but did have biphasic flow  I reviewed her CT scan from 03/25/2019.  At that time she had a complete occlusion at its origin of her superficial femoral artery with reconstitution of a diseased below-knee popliteal artery and single-vessel runoff via anterior tibial artery into the foot  MEDICAL ISSUES: Critical limb ischemia right foot.  I am quite concerned regarding her extent of tissue loss and explained that we may not be able to salvage her foot and may end up with amputation.  I have recommended arteriography for further evaluation.  She has had prior right vein harvest and therefore would have to have a prosthetic with much less durability to her diseased outflow arteries.  We will plan arteriography on a nondialysis day and make further recommendations regarding revascularization.  She will continue working with the wound center    Loukisha Gunnerson F. Tonica Brasington, MD FACS [facility] [place] Office Tel [phone]   

## 2020-02-20 NOTE — Progress Notes (Signed)
[facility]  Patient name: [name] MRN: [id number] DOB: 1945/06/20 Sex: female  REASON FOR VISIT: Evaluation nonhealing ulceration right lateral foot  HPI: [name] is a 74 y.o. female today for evaluation of nonhealing ulceration on her right lateral foot.  She is well-known to me from prior left leg surgery.  She had presented approximately 1 year ago with gangrene of her toes of her left foot.  She underwent arteriography on 03/25/2019 and this revealed bilateral superficial femoral artery occlusions.  She underwent left femoral to below-knee popliteal bypass with saphenous vein.  She underwent toe amputation with Dr. Sharol Given and eventually had healing of all of this.  She has done well from the standpoint of her left leg.  She does have known severe disease in her right leg as well.  She now has developed ulceration over the lateral aspect of her right foot.  She is initiated care at the wound center and is seen here today for evaluation of adequacy of healing.  She does have some discomfort associated with this.  She is on chronic hemodialysis.  She is currently in a wheelchair trying to minimize walking on her right foot.  Past Medical History:  Diagnosis Date  . Allergy   . Anterior dislocation of left shoulder 05/27/2019  . Arthritis    "maybe in my lower back" (08/27/2012)  . 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 Chronic combined systolic and diastolic CHF (congestive heart failure) ([place])   . CKD (chronic kidney disease), stage IV ([place])   . Coronary artery disease    a. NSTEMI in 05/2009 s/p CABG (LIMA-LAD, SVG-diagonal, SVG-OM1/OM 2).   Marland Kitchen Dyspnea    with exertion  . Family history of anesthesia complication    Mom has a hard time to wake up  . Foot ulcer ([place])    "I've had them on both feet" (08/27/2012)  . Gouty arthritis    "right index finger"  (08/27/2012)  . History of radiation therapy 06/09/13-07/06/13   left breast 50Gy  . Hyperlipidemia   . Hypertension   . Infection    right second toe  . Ischemic cardiomyopathy    a. 2011: EF 40-45%. b. EF 55-60% in 04/2017 but shortly after as inpatient was 45-50%.  Marland Kitchen LBBB (left bundle branch block)   . Mitral regurgitation   . Neuropathy    Hx; of B/L feet  . NSTEMI (non-ST elevated myocardial infarction) ([place]) 05/23/2009  . Osteomyelitis of foot ([place])   . PAD (peripheral artery disease) ([facility])    a. LE PAD (patient previously elected hold off L fem-pop), prev followed by Dr. Bridgett Larsson  . PONV (postoperative nausea and vomiting)   . Sinus headache    "occasionally" (08/27/2012)  . Type II diabetes mellitus ([place])   . Vitamin D deficiency     Family History  Problem Relation Age of Onset  . Kidney cancer Brother 57  . Hypertension Brother   . Breast cancer Sister 44       LCIS; BRCA negative  . Throat cancer Father 61       smoker  . Breast cancer Sister 18       Lobular breast cancer  . Breast cancer Maternal Aunt        dx in her 21s    SOCIAL HISTORY: Social History   Tobacco Use  . Smoking status: Never Smoker  . Smokeless tobacco:  Never Used  Substance Use Topics  . Alcohol use: Not Currently    Comment: rarely drinks wine    Allergies  Allergen Reactions  . Atenolol Rash and Other (See Comments)    Exacerbates gout   . Adhesive [Tape] Other (See Comments)    SKIN IS VERY SENSITIVE AND BRUISES AND TEARS EASILY; PLEASE USE AN ALTERNATIVE TO TAPE!!  . Contrast Media [Iodinated Diagnostic Agents] Rash  . Iohexol Rash  . Latex Rash    Current Outpatient Medications  Medication Sig Dispense Refill  . allopurinol (ZYLOPRIM) 100 MG tablet TAKE 1 TABLET TWICE DAILY TO PREVENT GOUT 180 tablet 3  . ARTIFICIAL TEAR SOLUTION OP Place 1 drop into both eyes in the morning and at bedtime.    Marland Kitchen aspirin EC 81 MG tablet Take 81 mg by mouth as needed. Alternates with Excedrin     . aspirin-acetaminophen-caffeine (EXCEDRIN EXTRA STRENGTH) 250-250-65 MG tablet Take 1 tablet by mouth 2 (two) times daily as needed (pain).    Marland Kitchen atorvastatin (LIPITOR) 10 MG tablet Take 1 tablet daily for cholesterol. 90 tablet 1  . b complex-vitamin c-folic acid (NEPHRO-VITE) 0.8 MG TABS tablet Take 1 tablet by mouth daily.     . Bisacodyl (LAXATIVE PO) Take 1 capsule by mouth 3 (three) times daily.    . cinacalcet (SENSIPAR) 30 MG tablet Take 1 tablet (30 mg total) by mouth daily with supper. 60 tablet 1  . cyclobenzaprine (FLEXERIL) 5 MG tablet Take 1 tablet (5 mg total) by mouth 3 (three) times daily as needed for muscle spasms. 60 tablet 1  . Darbepoetin Alfa (ARANESP) 100 MCG/0.5ML SOSY injection Inject 0.5 mLs (100 mcg total) into the vein every Tuesday with hemodialysis. 4.2 mL   . dextrose 50 % solution Dextrose 50%    . docusate sodium (COLACE) 100 MG capsule Take 200 mg by mouth 2 (two) times daily.     Marland Kitchen doxercalciferol (HECTOROL) 4 MCG/2ML injection Inject 2 mLs (4 mcg total) into the vein Every Tuesday,Thursday,and Saturday with dialysis. 2 mL   . doxycycline (VIBRA-TABS) 100 MG tablet Take 1 tablet (100 mg total) by mouth 2 (two) times daily. 14 tablet 0  . ferric gluconate 125 mg in sodium chloride 0.9 % 100 mL Inject 125 mg into the vein Every Tuesday,Thursday,and Saturday with dialysis.    Marland Kitchen glucose blood (PRODIGY NO CODING BLOOD GLUC) test strip USE 1 STRIP TO CHECK GLUCOSE THREE TIMES DAILY-DX-E11.9 250 each 0  . Homeopathic Products (THERAWORX RELIEF EX) Apply 1 application topically daily as needed (pain).    . hydrALAZINE (APRESOLINE) 25 MG tablet Take     3 tablets (75 mg)      3 x /day        for BP & Heart 810 tablet 0  . HYDROcodone-acetaminophen (NORCO/VICODIN) 5-325 MG tablet Take 1 tablet by mouth every 6 (six) hours as needed. 10 tablet 0  . insulin NPH-regular Human (70-30) 100 UNIT/ML injection Inject 10-30 Units into the skin 2 (two) times daily with a meal.       . Insulin Pen Needle (BD PEN NEEDLE NANO U/F) 32G X 4 MM MISC USE TO INJECT TWICE DAILY LANTUS INJECTIONS 180 each 3  . isosorbide dinitrate (ISORDIL) 10 MG tablet TAKE 1 TABLET THREE TIMES A DAY FOR BLOOD PRESSURE AND HEART 270 tablet 3  . Methoxy PEG-Epoetin Beta (MIRCERA IJ) Mircera    . Multiple Vitamins-Minerals (PRESERVISION AREDS 2 PO) Take 2 capsules by mouth daily at 12  noon.     . predniSONE (DELTASONE) 20 MG tablet     . pregabalin (LYRICA) 100 MG capsule Take 1  Capsule    2 to 3 x   /day if needed for Sciatica pain 90 capsule 0  . Probiotic Product (Zena) CAPS Take 1 capsule by mouth in the morning and at bedtime.    Marland Kitchen SANTYL ointment     . sevelamer carbonate (RENVELA) 800 MG tablet Take 2 tablets (1,600 mg total) by mouth 3 (three) times daily with meals. 90 tablet 1  . traZODone (DESYREL) 50 MG tablet Take 50 mg by mouth at bedtime as needed for sleep.     No current facility-administered medications for this visit.    REVIEW OF SYSTEMS:  _0  denotes positive finding, _1  denotes negative finding Cardiac  Comments:  Chest pain or chest pressure:    Shortness of breath upon exertion:    Short of breath when lying flat:    Irregular heart rhythm:        Vascular    Pain in calf, thigh, or hip brought on by ambulation:    Pain in feet at night that wakes you up from your sleep:     Blood clot in your veins:    Leg swelling:           PHYSICAL EXAM: Vitals:   02/20/20 1335  BP: 132/82  Pulse: 78  Resp: 18  Temp: 97.8 F (36.6 C)  TempSrc: Other (Comment)  SpO2: 92%  Weight: 219 lb 5.7 oz (99.5 kg)  Height: _2  (1.626 m)    GENERAL: The patient is a well-nourished female, in no acute distress. The vital signs are documented above. CARDIOVASCULAR: She does have a palpable right femoral pulse but absent distal pulses PULMONARY: There is good air exchange  MUSCULOSKELETAL: There are no major deformities or cyanosis. NEUROLOGIC: No focal  weakness or paresthesias are detected. SKIN: She has a long linear open ulceration over the lateral aspect of her right foot.  This appears to have very poor healing at the base.  Her metatarsal bone appears to be just under the surface although it is not obviously exposed PSYCHIATRIC: The patient has a normal affect.  DATA:  Recent noninvasive studies on 02/17/2020 reveal ankle arm index of 0.46 on the right which were dampened and monophasic.  On the left she had calcified vessels but did have biphasic flow  I reviewed her CT scan from 03/25/2019.  At that time she had a complete occlusion at its origin of her superficial femoral artery with reconstitution of a diseased below-knee popliteal artery and single-vessel runoff via anterior tibial artery into the foot  MEDICAL ISSUES: Critical limb ischemia right foot.  I am quite concerned regarding her extent of tissue loss and explained that we may not be able to salvage her foot and may end up with amputation.  I have recommended arteriography for further evaluation.  She has had prior right vein harvest and therefore would have to have a prosthetic with much less durability to her diseased outflow arteries.  We will plan arteriography on a nondialysis day and make further recommendations regarding revascularization.  She will continue working with the wound center    [name], MD [facility] Tel [phone]

## 2020-02-21 DIAGNOSIS — N186 End stage renal disease: Secondary | ICD-10-CM | POA: Diagnosis not present

## 2020-02-21 DIAGNOSIS — N2581 Secondary hyperparathyroidism of renal origin: Secondary | ICD-10-CM | POA: Diagnosis not present

## 2020-02-21 DIAGNOSIS — Z992 Dependence on renal dialysis: Secondary | ICD-10-CM | POA: Diagnosis not present

## 2020-02-21 DIAGNOSIS — D509 Iron deficiency anemia, unspecified: Secondary | ICD-10-CM | POA: Diagnosis not present

## 2020-02-21 DIAGNOSIS — D631 Anemia in chronic kidney disease: Secondary | ICD-10-CM | POA: Diagnosis not present

## 2020-02-22 ENCOUNTER — Other Ambulatory Visit: Payer: Self-pay

## 2020-02-22 ENCOUNTER — Encounter (HOSPITAL_BASED_OUTPATIENT_CLINIC_OR_DEPARTMENT_OTHER): Payer: Medicare Other | Admitting: Physician Assistant

## 2020-02-22 DIAGNOSIS — L97512 Non-pressure chronic ulcer of 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d: Secondary | ICD-10-CM | POA: Diagnosis not present

## 2020-02-22 DIAGNOSIS — L97518 Non-pressure chronic ulcer of other part of right foot with other specified severity: Secondary | ICD-10-CM | POA: Diagnosis not present

## 2020-02-22 DIAGNOSIS — E1136 Type 2 diabetes mellitus with diabetic cataract: Secondary | ICD-10-CM | POA: Diagnosis not present

## 2020-02-22 DIAGNOSIS — E11621 Type 2 diabetes mellitus with foot ulcer: Secondary | ICD-10-CM | POA: Diagnosis not present

## 2020-02-22 DIAGNOSIS — E1151 Type 2 diabetes mellitus with diabetic peripheral angiopathy without gangrene: Secondary | ICD-10-CM | POA: Diagnosis not present

## 2020-02-22 DIAGNOSIS — E1140 Type 2 diabetes mellitus with diabetic neuropathy, unspecified: Secondary | ICD-10-CM | POA: Diagnosis not present

## 2020-02-22 NOTE — Progress Notes (Signed)
error 

## 2020-02-22 NOTE — Progress Notes (Addendum)
SKYLINN, VIALPANDO ([id number]) Visit Report for 02/22/2020 Chief Complaint Document Details Patient Name: Date of Service: [name], [place] NNA J. 02/22/2020 12:30 PM Medical Record Number: [id number] Patient Account Number: [id number] Date of Birth/Sex: Treating RN: Aug 26, 1945 (74 y.o. [name] Primary Care Provider: [name] Other Clinician: Referring Provider: Treating Provider/Extender: Azucena Kuba in Treatment: 3 Information Obtained from: Patient Chief Complaint Right plantar foot ulcer Electronic Signature(s) Signed: 02/22/2020 12:38:58 PM By: Worthy Keeler PA-C Entered By: Worthy Keeler on 02/22/2020 12:38:58 -------------------------------------------------------------------------------- Debridement Details Patient Name: Date of Service: Pamela Pilon, DO NNA J. 02/22/2020 12:30 PM Medical Record Number: [id number] Patient Account Number: [id number] Date of Birth/Sex: Treating RN: 1945/04/18 (74 y.o. [name] Primary Care Provider: [name] Other Clinician: Referring Provider: Treating Provider/Extender: Azucena Kuba in Treatment: 3 Debridement Performed for Assessment: Wound #16 Right,Distal,Lateral Foot Performed By: Clinician Carlene Coria, RN Debridement Type: Chemical/Enzymatic/Mechanical Agent Used: Santyl Severity of Tissue Pre Debridement: Fat layer exposed Level of Consciousness (Pre-procedure): Awake and Alert Pre-procedure Verification/Time Out Yes - 14:00 Taken: Bleeding: None Response to Treatment: Procedure was tolerated well Level of Consciousness (Post- Awake and Alert procedure): Post Debridement Measurements of Total Wound Length: (cm) 0.5 Width: (cm) 0.5 Depth: (cm) 0.2 Volume: (cm) 0.039 Character of Wound/Ulcer Post Debridement: Requires Further Debridement Severity of Tissue Post Debridement: Fat layer exposed Post Procedure Diagnosis Same as  Pre-procedure Electronic Signature(s) Signed: 02/22/2020 5:04:51 PM By: Worthy Keeler PA-C Signed: 02/23/2020 2:58:18 PM By: Baruch Gouty RN, BSN Entered By: Baruch Gouty on 02/22/2020 14:01:45 -------------------------------------------------------------------------------- Debridement Details Patient Name: Date of Service: Pamela Pilon, DO NNA J. 02/22/2020 12:30 PM Medical Record Number: [id number] Patient Account Number: [id number] Date of Birth/Sex: Treating RN: 11-23-1945 (74 y.o. [name] Primary Care Provider: [name] Other Clinician: Referring Provider: Treating Provider/Extender: Azucena Kuba in Treatment: 3 Debridement Performed for Assessment: Wound #18 Right,Proximal,Lateral Foot Performed By: Clinician Carlene Coria, RN Debridement Type: Chemical/Enzymatic/Mechanical Agent Used: Santyl Severity of Tissue Pre Debridement: Fat layer exposed Level of Consciousness (Pre-procedure): Awake and Alert Pre-procedure Verification/Time Out Yes - 14:00 Taken: Bleeding: None Response to Treatment: Procedure was tolerated well Level of Consciousness (Post- Awake and Alert procedure): Post Debridement Measurements of Total Wound Length: (cm) 4.1 Width: (cm) 1.3 Depth: (cm) 0.2 Volume: (cm) 0.837 Character of Wound/Ulcer Post Debridement: Requires Further Debridement Severity of Tissue Post Debridement: Fat layer exposed Post Procedure Diagnosis Same as Pre-procedure Electronic Signature(s) Signed: 02/22/2020 5:04:51 PM By: Worthy Keeler PA-C Signed: 02/23/2020 2:58:18 PM By: Baruch Gouty RN, BSN Entered By: Baruch Gouty on 02/22/2020 14:02:15 -------------------------------------------------------------------------------- Debridement Details Patient Name: Date of Service: Pamela Pilon, DO NNA J. 02/22/2020 12:30 PM Medical Record Number: [id number] Patient Account Number: [id number] Date of Birth/Sex: Treating  RN: 16-Mar-1946 (74 y.o. [name] Primary Care Provider: [name] Other Clinician: Referring Provider: Treating Provider/Extender: Azucena Kuba in Treatment: 3 Debridement Performed for Assessment: Wound #17 Right,Plantar Foot Performed By: Clinician Carlene Coria, RN Debridement Type: Debridement Severity of Tissue Pre Debridement: Fat layer exposed Level of Consciousness (Pre-procedure): Awake and Alert Pre-procedure Verification/Time Out Yes - 14:00 Taken: T Area Debrided (L x W): otal 0.3 (cm) x 0.5 (cm) = 0.15 (cm) Tissue and other material debrided: Viable, Non-Viable, Callus, Slough, Subcutaneous, Slough Level: Skin/Subcutaneous Tissue Debridement Description: Excisional Instrument: Curette, N/A Bleeding: Minimum Hemostasis Achieved: Pressure Response  to Treatment: Procedure was tolerated well Level of Consciousness (Post- Awake and Alert procedure): Post Debridement Measurements of Total Wound Length: (cm) 0.3 Width: (cm) 0.5 Depth: (cm) 0.4 Volume: (cm) 0.047 Character of Wound/Ulcer Post Debridement: Requires Further Debridement Severity of Tissue Post Debridement: Fat layer exposed Post Procedure Diagnosis Same as Pre-procedure Electronic Signature(s) Signed: 02/22/2020 5:04:51 PM By: Worthy Keeler PA-C Signed: 02/23/2020 2:58:18 PM By: Baruch Gouty RN, BSN Entered By: Baruch Gouty on 02/22/2020 14:04:30 -------------------------------------------------------------------------------- HPI Details Patient Name: Date of Service: Pamela Pilon, DO NNA J. 02/22/2020 12:30 PM Medical Record Number: [id number] Patient Account Number: [id number] Date of Birth/Sex: Treating RN: 13-Jan-1946 (74 y.o. [name] Primary Care Provider: [name] Other Clinician: Referring Provider: Treating Provider/Extender: Azucena Kuba in Treatment: 3 History of Present Illness HPI  Description: 02/23/2019 on evaluation today patient presents for initial evaluation in our clinic concerning issues that she has been having with her bilateral lower extremities as well as her plantar foot on the right. This has been quite some time in regard to the foot ulcer even back as far as the beginning of this year that is 2020. She has had the wounds on her legs a much shorter time but that still has been roughly 2-3 months. Nonetheless she is really not shown signs of a lot of improvement. She tells me that she does have a past medical history of diabetes, venous insufficiency, lymphedema which is mild but nonetheless present, peripheral vascular disease and she has previously been recommended to have a femoral-popliteal bypass but put that off at that point they are basically just monitoring at this time. She has end-stage renal disease with dependence on renal dialysis, hypertension, and obviously wounds of the bilateral lower extremities at this point. She tells me that she is having some discomfort but fortunately nothing too significant at this point which is good news. No fevers, chills, nausea, vomiting, or diarrhea. She is mainly been leaving these areas open to air as much as possible at this point at 1 point she was trying to keep them covered more but that really did not help either. Fortunately there is no signs of active systemic nor local infection at this time. 03/02/2019 on evaluation today patient appears to be doing well with regard to her wounds. The leg ulcers which were very dry are much more moist at this point and seem to be doing great at this time. Overall I am very pleased with how things seem to be progressing. With regard to her foot ulcer it is showing some signs of slight improvement in my opinion overall there does not appear to be anything worse at least which is also good news. No fevers, chills, nausea, vomiting, or diarrhea. 03/09/2019 on evaluation today  patient appears to be doing better with regard to her lower extremity ulcers a lot of the necrotic tissue is loosening up which is good news. In regard to her right plantar foot ulcer this appears to be roughly the same based on what I am seeing currently although there is less callus buildup. Unfortunately she does have an open wound area on the left lateral foot region which unfortunately probes down to bone once this was thoroughly examined. This is something she did not even know was opened she had had a Band-Aid over it just for protection last couple times I saw her. 03/16/19 evaluation today patient appears to be doing Someone poorly in regard to her left  lower extremity. This seems to be likely infected based on what Im seeing at this point. Theres no signs of active infection at this time systemically but nonetheless theres a lot more erythema of the foot in general unfortunately. Her x-rays were reviewed and did not show any evidence of infection of the right foot. On the left there was a A little bit more going on but no obvious evidence of osteomyelitis though it was mentioned that if that was still a concern that number I would be the best way to further evaluate this. I think they were going to need to proceed with MRI to be honest based on what Im seeing today. 03/23/2019 on evaluation today patient actually appears to be doing worse compared to even last week's visit with regard to her wounds especially the left foot but even the right lower extremity. Both are measuring deeper as far as the overall depth of the wound on the left foot this also seems to have spread as far as the open wound location. It also does appear to me that the cellulitis has spread further up her leg which also has me very concerned at this point. There fortunately is no signs of active infection at this point systemically such as evidence of sepsis but nonetheless I am still concerned about the fact that again the  patient does not seem to be turning around. We did have her on IV vancomycin through dialysis which they graciously help this with as well. With that being said it appears based on her culture results that I did review today that cefepime or something of the like would likely be a better option the vancomycin is not likely to help with the Serratia species that was identified. 03/28/19 Fem-Pop 04/06/19 Amp toe 61month follow-up with vascular in april 05/18/2019 on evaluation today patient presents for reevaluation here in the clinic after having been sent to the ER by myself December 2020 due to overall worsening of her condition. She did have a femoral-popliteal bypass performed on 03/28/2019. 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 Subsequently she does have a 68-month follow-up with vascular in April but seems to be doing well from a vascular standpoint at this time. Overall the main issue for which she comes in today she does have a area on the plantar aspect of her right foot where she does have a callus there may or may not be a wound under this region. Subsequently she does have several small ulcerations on the bilateral lower extremities that are more venous stasis in nature. Continue to these do need to be addressed today as well. Fortunately there is no signs of active infection at this time. 05/25/2019 upon evaluation today patient appears to be doing well in regard to her lower extremities. Fortunately there is no signs of active infection at this time. No fever chills noted. She has been tolerating the dressing changes without complication. Fortunately she is doing much better in regard to her plantar foot in fact this appears to be completely healed which is excellent news. 06/01/2019 on evaluation today patient actually appears to be doing very well in regard to her wounds at this point. Fortunately there is no signs of  active infection. No fevers, chills, nausea, vomiting, or diarrhea. 06/08/2019 upon evaluation today patient actually appears to be doing great in regard to her right lower extremity there are no open wounds remaining and she has  done extremely well. Unfortunately she has several blistered areas that opened up on the left lower extremity in the interim between last week and this week. She has not been wearing any compression she states that I never told her anything about wearing compression. With that being said this is something that we have discussed in the past and it is something that we wanted her to do and again I did remind her of that today as well. 06/15/2019 upon evaluation today patient appears to be doing well with regard to her wound she does have some dressing material stuck to the surface of her wounds apparently they dried out and healed fairly quickly causing this to stick. Fortunately we should be able to clean this off but again that is one of the main issues that I see at this point. Fortunately there is no signs of active infection at this time. No fevers, chills, nausea, vomiting, or diarrhea. 06/22/2019 upon evaluation today patient appears to be doing very well in regard to her wounds. She has been tolerating the dressing changes without complication. Fortunately there is no signs of active infection at this time. No fevers, chills, nausea, vomiting, or diarrhea. In fact the right leg appears to be completely healed she does have 1 pair of the compression socks with her 15-20 mmHg. Subsequently her left leg is doing much better although not completely healed she still has 1 area open at this point. 06/29/2019 on evaluation today patient appears to be doing excellent in regard to her left lower extremity. There is no signs of active infection at this time the wounds appear to be completely healed and overall she is doing excellent. Readmission: 02/01/2020 upon evaluation today  patient appears to be doing somewhat poorly in regard to her right foot ulcer locations. She has 3 spots that are problematic at this time. Unfortunately she has been seeing podiatry but they have not been able to get them to improve. She does have poor blood flow here and I do believe that she really needs to go back and see vascular for further evaluation and recommendations in this regard. I also think an x-ray would be ideal considering the fact that that according to the patient has not been done as of yet. I also believe Santyl will likely be beneficial for her. She has had a bypass surgery on her leg on the left they were holding off on the right unless it became an absolute necessity due to the fact that unfortunately she also has had a heart bypass which has been somewhat problematic for further intervention in the right lower extremity. 02/08/2020 upon evaluation today patient appears to be doing well with regard to her foot all things considered at each wound location. With that being said the x- ray was negative for any signs of obvious osteomyelitis. Nonetheless she is still awaiting the ultrasound and visit with Dr. Donnetta Hutching due to critical limb ischemia. Hopefully that will be coming up in the next week which the patient tells me is so. 02/22/2020 on evaluation today patient appears to be doing decently well just roughly the same in regard to her foot ulcers. She has seen Dr. Donnetta Hutching he has recommended an arteriogram that supposed to be scheduled for her but she tells me she has not heard anything from vascular about setting this up as of yet. Nonetheless I think the sooner that can be done the better as far as figuring out exactly what were going is concerned. Fortunately  there is no signs of active infection at this time which is great news. No fevers, chills, nausea, vomiting, or diarrhea. Patient's left leg showed to be noncompressible on her ABI testing her right leg showed low with an ABI  of 0.65 and a TBI of 0.39. Electronic Signature(s) Signed: 02/22/2020 2:07:30 PM By: Worthy Keeler PA-C Entered By: Worthy Keeler on 02/22/2020 14:07:29 -------------------------------------------------------------------------------- Physical Exam Details Patient Name: Date of Service: Pamela Pilon, DO NNA J. 02/22/2020 12:30 PM Medical Record Number: [id number] Patient Account Number: [id number] Date of Birth/Sex: Treating RN: Jan 13, 1946 (74 y.o. [name] Primary Care Provider: [name] Other Clinician: Referring Provider: Treating Provider/Extender: Azucena Kuba in Treatment: 3 Constitutional Well-nourished and well-hydrated in no acute distress. Respiratory normal breathing without difficulty. Psychiatric this patient is able to make decisions and demonstrates good insight into disease process. Alert and Oriented x 3. pleasant and cooperative. Notes On inspection patient's wound on the plantar aspect of her foot did require some sharp debridement clear away some of the callus of the we get the dressing to the area. I did not perform an aggressive debridement due to her poor blood flow I did not want to cause any issues in this regard. Electronic Signature(s) Signed: 02/22/2020 2:07:45 PM By: Worthy Keeler PA-C Signed: 02/22/2020 2:07:45 PM By: Worthy Keeler PA-C Entered By: Worthy Keeler on 02/22/2020 14:07:45 -------------------------------------------------------------------------------- Physician Orders Details Patient Name: Date of Service: Pamela Pilon, DO NNA J. 02/22/2020 12:30 PM Medical Record Number: [id number] Patient Account Number: [id number] Date of Birth/Sex: Treating RN: Aug 12, 1945 (74 y.o. [name] Primary Care Provider: [name] Other Clinician: Referring Provider: Treating Provider/Extender: Azucena Kuba in Treatment: 3 Verbal / Phone Orders: No Diagnosis  Coding ICD-10 Coding Code Description I87.2 Venous insufficiency (chronic) (peripheral) I89.0 Lymphedema, not elsewhere classified E11.621 Type 2 diabetes mellitus with foot ulcer I73.89 Other specified peripheral vascular diseases L97.512 Non-pressure chronic ulcer of 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d L97.518 Non-pressure chronic ulcer of other part of right foot with other specified severity N18.6 End stage renal disease Z99.2 Dependence on renal dialysis I10 Essential (primary) hypertension Follow-up Appointments Return Appointment in 2 weeks. Dressing Change Frequency Wound #16 Right,Distal,Lateral Foot Change dressing every day. Wound #17 Right,Plantar Foot Change dressing every day. Wound #18 Right,Proximal,Lateral Foot Change dressing every day. Skin Barriers/Peri-Wound Care Moisturizing lotion - to foot and leg with dressing changes Wound Cleansing May shower and wash wound with soap and water. - use dial antibacterial soap Primary Wound Dressing Wound #16 Right,Distal,Lateral Foot Santyl Ointment Wound #17 Right,Plantar Foot Silver Collagen Wound #18 Right,Proximal,Lateral Foot Santyl Ointment Secondary Dressing Wound #16 Right,Distal,Lateral Foot Kerlix/Rolled Gauze Dry Gauze Wound #17 Right,Plantar Foot Foam - foam donut Kerlix/Rolled Gauze Dry Gauze Wound #18 Right,Proximal,Lateral Foot Kerlix/Rolled Gauze Dry [facility]. - [place] Signature(s) Signed: 02/22/2020 5:04:51 PM By: Worthy Keeler PA-C Signed: 02/23/2020 2:58:18 PM By: Baruch Gouty RN, BSN Entered By: Baruch Gouty on 02/22/2020 14:05:14 -------------------------------------------------------------------------------- Problem List Details Patient Name: Date of Service: Pamela Pilon, DO NNA J. 02/22/2020 12:30 PM Medical Record Number: [id number] Patient Account Number: [id number] Date of  Birth/Sex: Treating RN: 1945/10/01 (74 y.o. [name] Primary Care Provider: [name] Other Clinician: Referring Provider: Treating Provider/Extender: Azucena Kuba in Treatment: 3 Active Problems ICD-10 Encounter Code  Description Active Date MDM Diagnosis I87.2 Venous insufficiency (chronic) (peripheral) 02/01/2020 No Yes I89.0 Lymphedema, not elsewhere classified 02/01/2020 No Yes E11.621 Type 2 diabetes mellitus with foot ulcer 02/01/2020 No Yes I73.89 Other specified peripheral vascular diseases 02/01/2020 No Yes L97.512 Non-pressure chronic ulcer of 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d 02/01/2020 No Yes L97.518 Non-pressure chronic ulcer of other part of right foot with other specified 02/01/2020 No Yes severity N18.6 End stage renal disease 02/01/2020 No Yes Z99.2 Dependence on renal dialysis 02/01/2020 No Yes I10 Essential (primary) hypertension 02/01/2020 No Yes Inactive Problems Resolved Problems Electronic Signature(s) Signed: 02/22/2020 12:38:51 PM By: Worthy Keeler PA-C Entered By: Worthy Keeler on 02/22/2020 12:38:51 -------------------------------------------------------------------------------- Progress Note Details Patient Name: Date of Service: Pamela Pilon, DO NNA J. 02/22/2020 12:30 PM Medical Record Number: [id number] Patient Account Number: [id number] Date of Birth/Sex: Treating RN: 05-Dec-1945 (74 y.o. [name] Primary Care Provider: [name] Other Clinician: Referring Provider: Treating Provider/Extender: Azucena Kuba in Treatment: 3 Subjective Chief Complaint Information obtained from Patient Right plantar foot ulcer History of Present Illness (HPI) 02/23/2019 on evaluation today patient presents for initial evaluation in our clinic concerning issues that she has been having with her bilateral lower extremities as well as her plantar foot on the  right. This has been quite some time in regard to the foot ulcer even back as far as the beginning of this year that is 2020. She has had the wounds on her legs a much shorter time but that still has been roughly 2-3 months. Nonetheless she is really not shown signs of a lot of improvement. She tells me that she does have a past medical history of diabetes, venous insufficiency, lymphedema which is mild but nonetheless present, peripheral vascular disease and she has previously been recommended to have a femoral-popliteal bypass but put that off at that point they are basically just monitoring at this time. She has end-stage renal disease with dependence on renal dialysis, hypertension, and obviously wounds of the bilateral lower extremities at this point. She tells me that she is having some discomfort but fortunately nothing too significant at this point which is good news. No fevers, chills, nausea, vomiting, or diarrhea. She is mainly been leaving these areas open to air as much as possible at this point at 1 point she was trying to keep them covered more but that really did not help either. Fortunately there is no signs of active systemic nor local infection at this time. 03/02/2019 on evaluation today patient appears to be doing well with regard to her wounds. The leg ulcers which were very dry are much more moist at this point and seem to be doing great at this time. Overall I am very pleased with how things seem to be progressing. With regard to her foot ulcer it is showing some signs of slight improvement in my opinion overall there does not appear to be anything worse at least which is also good news. No fevers, chills, nausea, vomiting, or diarrhea. 03/09/2019 on evaluation today patient appears to be doing better with regard to her lower extremity ulcers a lot of the necrotic tissue is loosening up which is good news. In regard to her right plantar foot ulcer this appears to be roughly the  same based on what I am seeing currently although there is less callus buildup. Unfortunately she does have an open wound area on the left lateral foot region which  unfortunately probes down to bone once this was thoroughly examined. This is something she did not even know was opened she had had a Band-Aid over it just for protection last couple times I saw her. 03/16/19 evaluation today patient appears to be doing Someone poorly in regard to her left lower extremity. This seems to be likely infected based on what Ioom seeing at this point. Thereoos no signs of active infection at this time systemically but nonetheless thereoos a lot more erythema of the foot in general unfortunately. Her x-rays were reviewed and did not show any evidence of infection of the right foot. On the left there was a A little bit more going on but no obvious evidence of osteomyelitis though it was mentioned that if that was still a concern that number I would be the best way to further evaluate this. I think they were going to need to proceed with MRI to be honest based on what Ioom seeing today. 03/23/2019 on evaluation today patient actually appears to be doing worse compared to even last week's visit with regard to her wounds especially the left foot but even the right lower extremity. Both are measuring deeper as far as the overall depth of the wound on the left foot this also seems to have spread as far as the open wound location. It also does appear to me that the cellulitis has spread further up her leg which also has me very concerned at this point. There fortunately is no signs of active infection at this point systemically such as evidence of sepsis but nonetheless I am still concerned about the fact that again the patient does not seem to be turning around. We did have her on IV vancomycin through dialysis which they graciously help this with as well. With that being said it appears based on her culture results  that I did review today that cefepime or something of the like would likely be a better option the vancomycin is not likely to help with the Serratia species that was identified. 03/28/19 Fem-Pop 04/06/19 Amp toe 48month follow-up with vascular in april 05/18/2019 on evaluation today patient presents for reevaluation here in the clinic after having been sent to the ER by myself December 2020 due to overall worsening of her condition. She did have a femoral-popliteal bypass performed on 03/28/2019. 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 Subsequently she does have a 22-month follow-up with vascular in April but seems to be doing well from a vascular standpoint at this time. Overall the main issue for which she comes in today she does have a area on the plantar aspect of her right foot where she does have a callus there may or may not be a wound under this region. Subsequently she does have several small ulcerations on the bilateral lower extremities that are more venous stasis in nature. Continue to these do need to be addressed today as well. Fortunately there is no signs of active infection at this time. 05/25/2019 upon evaluation today patient appears to be doing well in regard to her lower extremities. Fortunately there is no signs of active infection at this time. No fever chills noted. She has been tolerating the dressing changes without complication. Fortunately she is doing much better in regard to her plantar foot in fact this appears to be completely healed which is excellent news. 06/01/2019 on evaluation today patient actually appears to be doing very well  in regard to her wounds at this point. Fortunately there is no signs of active infection. No fevers, chills, nausea, vomiting, or diarrhea. 06/08/2019 upon evaluation today patient actually appears to be doing great in regard to her right lower extremity there are no open wounds  remaining and she has done extremely well. Unfortunately she has several blistered areas that opened up on the left lower extremity in the interim between last week and this week. She has not been wearing any compression she states that I never told her anything about wearing compression. With that being said this is something that we have discussed in the past and it is something that we wanted her to do and again I did remind her of that today as well. 06/15/2019 upon evaluation today patient appears to be doing well with regard to her wound she does have some dressing material stuck to the surface of her wounds apparently they dried out and healed fairly quickly causing this to stick. Fortunately we should be able to clean this off but again that is one of the main issues that I see at this point. Fortunately there is no signs of active infection at this time. No fevers, chills, nausea, vomiting, or diarrhea. 06/22/2019 upon evaluation today patient appears to be doing very well in regard to her wounds. She has been tolerating the dressing changes without complication. Fortunately there is no signs of active infection at this time. No fevers, chills, nausea, vomiting, or diarrhea. In fact the right leg appears to be completely healed she does have 1 pair of the compression socks with her 15-20 mmHg. Subsequently her left leg is doing much better although not completely healed she still has 1 area open at this point. 06/29/2019 on evaluation today patient appears to be doing excellent in regard to her left lower extremity. There is no signs of active infection at this time the wounds appear to be completely healed and overall she is doing excellent. Readmission: 02/01/2020 upon evaluation today patient appears to be doing somewhat poorly in regard to her right foot ulcer locations. She has 3 spots that are problematic at this time. Unfortunately she has been seeing podiatry but they have not been able  to get them to improve. She does have poor blood flow here and I do believe that she really needs to go back and see vascular for further evaluation and recommendations in this regard. I also think an x-ray would be ideal considering the fact that that according to the patient has not been done as of yet. I also believe Santyl will likely be beneficial for her. She has had a bypass surgery on her leg on the left they were holding off on the right unless it became an absolute necessity due to the fact that unfortunately she also has had a heart bypass which has been somewhat problematic for further intervention in the right lower extremity. 02/08/2020 upon evaluation today patient appears to be doing well with regard to her foot all things considered at each wound location. With that being said the x- ray was negative for any signs of obvious osteomyelitis. Nonetheless she is still awaiting the ultrasound and visit with Dr. Donnetta Hutching due to critical limb ischemia. Hopefully that will be coming up in the next week which the patient tells me is so. 02/22/2020 on evaluation today patient appears to be doing decently well just roughly the same in regard to her foot ulcers. She has seen Dr. Donnetta Hutching he has  recommended an arteriogram that supposed to be scheduled for her but she tells me she has not heard anything from vascular about setting this up as of yet. Nonetheless I think the sooner that can be done the better as far as figuring out exactly what were going is concerned. Fortunately there is no signs of active infection at this time which is great news. No fevers, chills, nausea, vomiting, or diarrhea. Patient's left leg showed to be noncompressible on her ABI testing her right leg showed low with an ABI of 0.65 and a TBI of 0.39. Objective Constitutional Well-nourished and well-hydrated in no acute distress. Vitals Time Taken: 1:16 PM, Height: 64 in, Weight: 150 lbs, BMI: 25.7, Temperature: 98.4 F, Pulse:  81 bpm, Respiratory Rate: 16 breaths/min, Blood Pressure: 123/72 mmHg, Capillary Blood Glucose: 124 mg/dl. General Notes: glucose per pt report Respiratory normal breathing without difficulty. Psychiatric this patient is able to make decisions and demonstrates good insight into disease process. Alert and Oriented x 3. pleasant and cooperative. General Notes: On inspection patient's wound on the plantar aspect of her foot did require some sharp debridement clear away some of the callus of the we get the dressing to the area. I did not perform an aggressive debridement due to her poor blood flow I did not want to cause any issues in this regard. Integumentary (Hair, Skin) Wound #16 status is Open. Original cause of wound was Gradually Appeared. The wound is located on the Right,Distal,Lateral Foot. The wound measures 0.5cm length x 0.5cm width x 0.2cm depth; 0.196cm^2 area and 0.039cm^3 volume. There is no tunneling or undermining noted. There is a small amount of serous drainage noted. The wound margin is distinct with the outline attached to the wound base. There is no granulation within the wound bed. There is a large (67-100%) amount of necrotic tissue within the wound bed including Eschar. Wound #17 status is Open. Original cause of wound was Gradually Appeared. The wound is located on the [place]. The wound measures 0.1cm length x 0.1cm width x 0.5cm depth; 0.008cm^2 area and 0.004cm^3 volume. There is Fat Layer (Subcutaneous Tissue) exposed. There is no tunneling noted, however, there is undermining starting at 12:00 and ending at 12:00 with a maximum distance of 0.7cm. There is a small amount of serous drainage noted. The wound margin is epibole. There is large (67-100%) pink granulation within the wound bed. There is no necrotic tissue within the wound bed. Wound #18 status is Open. Original cause of wound was Gradually Appeared. The wound is located on the Right,Proximal,Lateral  Foot. The wound measures 4.1cm length x 1.3cm width x 0.2cm depth; 4.186cm^2 area and 0.837cm^3 volume. There is Fat Layer (Subcutaneous Tissue) exposed. There is no undermining noted. There is a medium amount of serosanguineous drainage noted. The wound margin is epibole. There is medium (34-66%) pink granulation within the wound bed. There is a medium (34-66%) amount of necrotic tissue within the wound bed including Adherent Slough. 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  There was a Chemical/Enzymatic/Mechanical debridement performed by Carlene Coria, RN.Marland Kitchen Agent used was [employer]. A time out was conducted at 14:00, prior to the start of the procedure. There was no bleeding. The procedure was tolerated well. Post Debridement Measurements: 0.5cm length x 0.5cm width x 0.2cm depth; 0.039cm^3 volume. Character of Wound/Ulcer Post Debridement requires further debridement. Severity of Tissue Post Debridement is: Fat layer exposed. Post procedure Diagnosis Wound #16: Same as Pre-Procedure Wound #17 Pre-procedure diagnosis of Wound #17 is a Diabetic Wound/Ulcer of the Lower Extremity located on the Right,Plantar Foot .Severity of Tissue Pre Debridement is: Fat layer exposed. There was a Excisional Skin/Subcutaneous Tissue Debridement with a total area of 0.15 sq cm performed by Carlene Coria, RN. With the following instrument(s): Curette to remove Viable and Non-Viable tissue/material. Material removed includes Callus, Subcutaneous Tissue, and Slough. No specimens were taken. A time out was conducted at 14:00, prior to the start of the procedure. A Minimum amount of bleeding was controlled with Pressure. The procedure was tolerated well. Post Debridement Measurements: 0.3cm length x 0.5cm width x 0.4cm depth; 0.047cm^3 volume. Character of Wound/Ulcer Post Debridement requires further debridement. Severity of Tissue Post Debridement is: Fat layer exposed. Post procedure Diagnosis Wound #17: Same as Pre-Procedure Wound #18 Pre-procedure diagnosis of Wound #18 is a Diabetic Wound/Ulcer of the Lower Extremity located on the Right,Proximal,Lateral Foot .Severity of Tissue Pre Debridement is: Fat layer exposed. There was a Chemical/Enzymatic/Mechanical debridement performed by Carlene Coria, RN.Marland Kitchen Agent used was [employer]. A time out was conducted at 14:00, prior to the start of the procedure. There was no bleeding. The procedure was tolerated well. Post Debridement Measurements: 4.1cm length x 1.3cm width x 0.2cm depth; 0.837cm^3 volume. Character of Wound/Ulcer Post Debridement requires further debridement. Severity of Tissue Post Debridement is: Fat layer exposed. Post procedure Diagnosis Wound #18: Same as Pre-Procedure Plan Follow-up Appointments: Return Appointment in 2 weeks. Dressing Change Frequency: Wound #16 Right,Distal,Lateral Foot: Change dressing every day. Wound #17 Right,Plantar Foot: Change dressing every day. Wound #18 Right,Proximal,Lateral Foot: Change dressing every day. Skin Barriers/Peri-Wound Care: Moisturizing lotion - to foot and leg with dressing changes Wound Cleansing: May shower and wash wound with soap and water. - use dial antibacterial soap Primary Wound Dressing: Wound #16 Right,Distal,Lateral Foot: Santyl Ointment Wound #17 Right,Plantar  Foot: Silver Collagen Wound #18 Right,Proximal,Lateral Foot: Santyl Ointment Secondary Dressing: Wound #16 Right,Distal,Lateral Foot: Kerlix/Rolled Gauze Dry Gauze Wound #17 Right,Plantar Foot: Foam - foam donut Kerlix/Rolled Gauze Dry Gauze Wound #18 Right,Proximal,Lateral Foot: Kerlix/Rolled Gauze Dry Gauze Home Health: [facility] for wound care. - [place] 1. Would recommend currently that we going to continue with the wound care measures as before were using the silver collagen to the plantar aspect of the wound bed. 2. With regard to the remaining wounds were actually using Santyl to these areas to try to help clear up some of the slough buildup on the surface of the wound and loosen this up quite a bit. 3. I would recommend she continue to carefully take care of her feet watching out for any trauma or injury obviously with her poor arterial flow this can be of great concern if she has any trauma. We will see patient back for reevaluation in 2 weeks here in the clinic. If anything worsens or changes patient will contact our office for additional recommendations. Electronic Signature(s) Signed: 02/22/2020 2:08:30 PM By: Worthy Keeler PA-C Entered By: Worthy Keeler on 02/22/2020 14:08:30 -------------------------------------------------------------------------------- Gl[facility]  Details Patient Name: Date of Service: [name], [place] NNA J. 02/22/2020 Medical Record Number: [id number] Patient Account Number: [id number] Date of Birth/Sex: Treating RN: [birth year] (73 y.o. [name] Primary Care Provider: [name] Other Clinician: Referring Provider: Treating Provider/Extender: Azucena Kuba in Treatment: 3 Diagnosis Coding ICD-10 Codes Code Description I87.2 Venous insufficiency (chronic) (peripheral) I89.0 Lymphedema, not elsewhere classified E11.621 Type 2 diabetes mellitus with foot  ulcer I73.89 Other specified peripheral vascular diseases L97.512 Non-pressure chronic ulcer of 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d L97.518 Non-pressure chronic ulcer of other part of right foot with other specified severity N18.6 End stage renal disease Z99.2 Dependence on renal dialysis I10 Essential (primary) hypertension Facility Procedures CPT4 Code: [id number] Description: 77824 - DEBRIDE W/O ANES NON SELECT Modifier: 59 Quantity: 1 CPT4 Code: [id number] Description: 31540 - DEB SUBQ TISSUE 20 SQ CM/< ICD-10 Diagnosis Description L97.512 Non-pressure chronic ulcer of 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d Modifier: Quantity: 1 Physician Procedures : CPT4 Code Description Modifier [id number] 50932 - WC PHYS SUBQ TISS 20 SQ CM ICD-10 Diagnosis Description L97.512 Non-pressure chronic ulcer of 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d Quantity: 1 Electronic Signature(s) Signed: 02/22/2020 2:08:42 PM By: Worthy Keeler PA-C Entered By: Worthy Keeler on 02/22/2020 14:08:41

## 2020-02-23 ENCOUNTER — Other Ambulatory Visit (HOSPITAL_COMMUNITY)
Admission: RE | Admit: 2020-02-23 | Discharge: 2020-02-23 | Disposition: A | Discharge status: Home / Self Care | Payer: Medicare Other | Source: Ambulatory Visit | Attending: Vascular Surgery | Admitting: Vascular Surgery

## 2020-02-23 DIAGNOSIS — Z01812 Encounter for preprocedural laboratory examination: Secondary | ICD-10-CM | POA: Diagnosis not present

## 2020-02-23 DIAGNOSIS — D509 Iron deficiency anemia, unspecified: Secondary | ICD-10-CM | POA: Diagnosis not present

## 2020-02-23 DIAGNOSIS — Z992 Dependence on renal dialysis: Secondary | ICD-10-CM | POA: Diagnosis not present

## 2020-02-23 DIAGNOSIS — D631 Anemia in chronic kidney disease: Secondary | ICD-10-CM | POA: Diagnosis not present

## 2020-02-23 DIAGNOSIS — N2581 Secondary hyperparathyroidism of renal origin: Secondary | ICD-10-CM | POA: Diagnosis not present

## 2020-02-23 DIAGNOSIS — Z20822 Contact with and (suspected) exposure to covid-19: Secondary | ICD-10-CM | POA: Insufficient documentation

## 2020-02-23 DIAGNOSIS — N186 End stage renal disease: Secondary | ICD-10-CM | POA: Diagnosis not present

## 2020-02-23 LAB — SARS CORONAVIRUS 2 (TAT 6-24 HRS): SARS Coronavirus 2: NEGATIVE

## 2020-02-23 NOTE — Progress Notes (Signed)
[name], [name] ([id number]) Visit Report for 02/22/2020 Arrival Information Details Patient Name: Date of Service: [name], Pamela [place] J. 02/22/2020 12:30 PM Medical Record Number: [id number] Patient Account Number: [id number] Date of Birth/Sex: Treating RN: 1945-09-13 (74 y.o. [name] Primary Care Strider Vallance: [name] Other Clinician: Referring Drucilla Cumber: Treating Tekesha Almgren/Extender: Azucena Kuba in Treatment: 3 Visit Information History Since Last Visit All ordered tests and consults were completed: Yes Patient Arrived: Wheel Chair Added or deleted any medications: No Arrival Time: 13:13 Any new allergies or adverse reactions: No Accompanied By: alone Had a fall or experienced change in No Transfer Assistance: None activities of daily living that may affect Patient Identification Verified: Yes risk of falls: Secondary Verification Process Completed: Yes Signs or symptoms of abuse/neglect since last visito No Patient Requires Transmission-Based Precautions: No Hospitalized since last visit: No Patient Has Alerts: Yes Implantable device outside of the clinic excluding No Patient Alerts: R ABI = [place] TBI = 0.39 cellular tissue based products placed in the center L ABI = [place] TBI = 0.79 since last visit: 07/2019 Has Dressing in Place as Prescribed: Yes Pain Present Now: No Electronic Signature(s) Signed: 02/22/2020 5:17:00 PM By: [name] RN, BSN Entered By: [name] on 02/22/2020 13:16:00 -------------------------------------------------------------------------------- Encounter Discharge Information Details Patient Name: Date of Service: Pamela Pilon, DO NNA J. 02/22/2020 12:30 PM Medical Record Number: [id number] Patient Account Number: [id number] Date of Birth/Sex: Treating RN: April 28, 1945 (74 y.o. [name] Primary Care Mileah Hemmer: [name] Other Clinician: Referring Derl Abalos: Treating Charman Blasco/Extender: Azucena Kuba in Treatment: 3 Encounter Discharge Information Items Post Procedure Vitals Discharge Condition: Stable Temperature (F): 98.4 Ambulatory Status: Wheelchair Pulse (bpm): 81 Discharge Destination: Home Respiratory Rate (breaths/min): 16 Transportation: Private Auto Blood Pressure (mmHg): 123/72 Accompanied By: self Schedule Follow-up Appointment: Yes Clinical Summary of Care: Patient Declined Electronic Signature(s) Signed: 02/22/2020 5:02:10 PM By: Carlene Coria RN Entered By: Carlene Coria on 02/22/2020 14:28:57 -------------------------------------------------------------------------------- Lower Extremity Assessment Details Patient Name: Date of Service: Pamela Pilon, DO NNA J. 02/22/2020 12:30 PM Medical Record Number: [id number] Patient Account Number: [id number] Date of Birth/Sex: Treating RN: 06/28/1945 (74 y.o. [name] Primary Care Kaimen Peine: [name] Other Clinician: Referring Deanthony Maull: Treating Milbert Bixler/Extender: Azucena Kuba in Treatment: 3 Edema Assessment Assessed: [Left: No] [Right: No] Edema: [Left: N] [Right: o] Calf Left: Right: Point of Measurement: 43 cm From Medial Instep 29 cm Ankle Left: Right: Point of Measurement: 11 cm From Medial Instep 19 cm Vascular Assessment Pulses: Dorsalis Pedis Palpable: [Right:No] Electronic Signature(s) Signed: 02/22/2020 5:17:00 PM By: [name] RN, BSN Entered By: [name] on 02/22/2020 13:18:38 -------------------------------------------------------------------------------- [place] Details Patient Name: Date of Service: Pamela Pilon, DO NNA J. 02/22/2020 12:30 PM Medical Record Number: [id number] Patient Account Number: [id number] Date of Birth/Sex: Treating RN: [birth year] (74 y.o. [name] Primary Care Bethene Hankinson: [name] Other Clinician: Referring Jie Stickels: Treating Tomislav Micale/Extender:  Azucena Kuba in Treatment: 3 Active Inactive Abuse / Safety / Falls / Self Care Management Nursing Diagnoses: Potential for falls Goals: Patient/caregiver will verbalize/demonstrate measure taken to improve self care Date Initiated: 02/08/2020 Target Resolution Date: 03/07/2020 Goal Status: Active Interventions: Assess fall risk on admission and as needed Notes: Tissue Oxygenation Nursing Diagnoses: Actual ineffective tissue perfusion; peripheral (select once diagnosis is confirmed) Knowledge deficit related to disease process and management Goals: Patient/caregiver will verbalize understanding of disease  process and disease management Date Initiated: 02/08/2020 Target Resolution Date: 03/07/2020 Goal Status: Active Interventions: Assess patient understanding of disease process and management upon diagnosis and as needed Assess peripheral arterial status upon admission and as needed Provide education on tissue oxygenation and ischemia Treatment Activities: T ordered outside of clinic : 02/08/2020 est Notes: Wound/Skin Impairment Nursing Diagnoses: Impaired tissue integrity Knowledge deficit related to ulceration/compromised skin integrity Goals: Patient/caregiver will verbalize understanding of skin care regimen Date Initiated: 02/08/2020 Target Resolution Date: 03/07/2020 Goal Status: Active Ulcer/skin breakdown will have a volume reduction of 30% by week 4 Date Initiated: 02/08/2020 Target Resolution Date: 03/07/2020 Goal Status: Active Interventions: Assess patient/caregiver ability to obtain necessary supplies Assess patient/caregiver ability to perform ulcer/skin care regimen upon admission and as needed Assess ulceration(s) every visit Provide education on ulcer and skin care Treatment Activities: Skin care regimen initiated : 02/08/2020 Topical wound management initiated : 02/08/2020 Notes: Electronic Signature(s) Signed: 02/23/2020  2:58:18 PM By: Baruch Gouty RN, BSN Entered By: Baruch Gouty on 02/22/2020 13:30:14 -------------------------------------------------------------------------------- Pain Assessment Details Patient Name: Date of Service: Pamela Pilon, DO NNA J. 02/22/2020 12:30 PM Medical Record Number: [id number] Patient Account Number: [id number] Date of Birth/Sex: Treating RN: 1945-05-16 (74 y.o. [name] Primary Care Mana Haberl: [name] Other Clinician: Referring Delinda Malan: Treating Antwion Carpenter/Extender: Azucena Kuba in Treatment: 3 Active Problems Location of Pain Severity and Description of Pain Patient Has Paino No Site Locations Pain Management and Medication Current Pain Management: Electronic Signature(s) Signed: 02/22/2020 5:17:00 PM By: [name] RN, BSN Entered By: [name] on 02/22/2020 13:17:20 -------------------------------------------------------------------------------- Patient/Caregiver Education Details Patient Name: Date of Service: Pamela Pilon, DO NNA J. 11/17/2021andnbsp12:30 PM Medical Record Number: [id number] Patient Account Number: [id number] Date of Birth/Gender: Treating RN: February 25, 1946 (74 y.o. [name] Primary Care Physician: [name] Other Clinician: Referring Physician: Treating Physician/Extender: Azucena Kuba in Treatment: 3 Education Assessment Education Provided To: Patient Education Topics Provided Tissue Oxygenation: Methods: Explain/Verbal Responses: Reinforcements needed, State content correctly Wound/Skin Impairment: Methods: Explain/Verbal Responses: Reinforcements needed, State content correctly Electronic Signature(s) Signed: 02/23/2020 2:58:18 PM By: Baruch Gouty RN, BSN Entered By: Baruch Gouty on 02/22/2020 13:30:44 -------------------------------------------------------------------------------- Wound Assessment Details Patient  Name: Date of Service: Pamela Pilon, DO NNA J. 02/22/2020 12:30 PM Medical Record Number: [id number] Patient Account Number: [id number] Date of Birth/Sex: Treating RN: Aug 04, 1945 (74 y.o. [name] Primary Care Eunique Balik: [name] Other Clinician: Referring Kashana Breach: Treating Norina Cowper/Extender: Azucena Kuba in Treatment: 3 Wound Status Wound Number: 16 Primary Diabetic Wound/Ulcer of the Lower Extremity Etiology: Wound Location: Right, Distal, Lateral Foot Wound Open Wounding Event: Gradually Appeared Status: Date Acquired: 11/06/2019 Comorbid Cataracts, Anemia, Asthma, Congestive Heart Failure, Coronary Weeks Of Treatment: 3 History: Artery Disease, Hypertension, Peripheral Arterial Disease, Clustered Wound: No Peripheral Venous Disease, Type II Diabetes, End Stage Renal Disease, Osteoarthritis, Osteomyelitis, Neuropathy, Received Radiation Photos Photo Uploaded By: Mikeal Hawthorne on 02/23/2020 14:10:04 Wound Measurements Length: (cm) 0.5 Width: (cm) 0.5 Depth: (cm) 0.2 Area: (cm) 0.196 Volume: (cm) 0.039 % Reduction in Area: 68.8% % Reduction in Volume: 38.1% Epithelialization: None Tunneling: No Undermining: No Wound Description Classification: Unable to visualize wound bed Wound Margin: Distinct, outline attached Exudate Amount: Small Exudate Type: Serous Exudate Color: amber Foul Odor After Cleansing: No Slough/Fibrino No Wound Bed Granulation Amount: None Present (0%) Exposed Structure Necrotic Amount: Large (67-100%) Fascia Exposed: No Necrotic Quality: Eschar Fat Layer (Subcutaneous Tissue) Exposed: No  Tendon Exposed: No Muscle Exposed: No Joint Exposed: No Bone Exposed: No Treatment Notes Wound #16 (Right, Distal, Lateral Foot) 1. Cleanse With Wound Cleanser 3. Primary Dressing Applied Santyl 4. Secondary Dressing Dry Gauze Roll Gauze 5. Secured With Recruitment consultant) Signed: 02/22/2020  5:17:00 PM By: [name] RN, BSN Entered By: [name] on 02/22/2020 13:24:02 -------------------------------------------------------------------------------- Wound Assessment Details Patient Name: Date of Service: Pamela Pilon, DO NNA J. 02/22/2020 12:30 PM Medical Record Number: [id number] Patient Account Number: [id number] Date of Birth/Sex: Treating RN: 1945-04-25 (74 y.o. [name] Primary Care Ananda Caya: [name] Other Clinician: Referring Kendallyn Lippold: Treating Cristin Szatkowski/Extender: Azucena Kuba in Treatment: 3 Wound Status Wound Number: 17 Primary Diabetic Wound/Ulcer of the Lower Extremity Etiology: Wound Location: Right, Plantar Foot Wound Open Wounding Event: Gradually Appeared Status: Date Acquired: 11/06/2018 Comorbid Cataracts, Anemia, Asthma, Congestive Heart Failure, Coronary Weeks Of Treatment: 3 History: Artery Disease, Hypertension, Peripheral Arterial Disease, Clustered Wound: No Peripheral Venous Disease, Type II Diabetes, End Stage Renal Disease, Osteoarthritis, Osteomyelitis, Neuropathy, Received Radiation Photos Photo Uploaded By: Mikeal Hawthorne on 02/23/2020 14:10:04 Wound Measurements Length: (cm) 0.1 % Re Width: (cm) 0.1 % Re Depth: (cm) 0.5 Epit Area: (cm) 0.008 Tun Volume: (cm) 0.004 Und S E M duction in Area: 83% duction in Volume: 78.9% helialization: None neling: No ermining: Yes tarting Position (o'clock): 12 nding Position (o'clock): 12 aximum Distance: (cm) 0.7 Wound Description Classification: Grade 2 Fou Wound Margin: Epibole Slo Exudate Amount: Small Exudate Type: Serous Exudate Color: amber l Odor After Cleansing: No ugh/Fibrino No Wound Bed Granulation Amount: Large (67-100%) Exposed Structure Granulation Quality: Pink Fascia Exposed: No Necrotic Amount: None Present (0%) Fat Layer (Subcutaneous Tissue) Exposed: Yes Tendon Exposed: No Muscle Exposed: No Joint Exposed:  No Bone Exposed: No Treatment Notes Wound #17 (Right, Plantar Foot) 1. Cleanse With Wound Cleanser 3. Primary Dressing Applied Colle[employer] Dry Gauze 4. Secondary Dressing Roll Gauze 5. Secured With Recruitment consultant) Signed: 02/22/2020 5:17:00 PM By: [name] RN, BSN Entered By: [name] on 02/22/2020 13:22:24 -------------------------------------------------------------------------------- Wound Assessment Details Patient Name: Date of Service: Pamela Pilon, DO NNA J. 02/22/2020 12:30 PM Medical Record Number: [id number] Patient Account Number: [id number] Date of Birth/Sex: Treating RN: 07-06-1945 (74 y.o. [name] Primary Care Akai Dollard: [name] Other Clinician: Referring Haylyn Halberg: Treating Arvid Marengo/Extender: Azucena Kuba in Treatment: 3 Wound Status Wound Number: 18 Primary Diabetic Wound/Ulcer of the Lower Extremity Etiology: Wound Location: Right, Proximal, Lateral Foot Wound Open Wounding Event: Gradually Appeared Status: Date Acquired: 11/06/2018 Comorbid Cataracts, Anemia, Asthma, Congestive Heart Failure, Coronary Weeks Of Treatment: 3 History: Artery Disease, Hypertension, Peripheral Arterial Disease, Clustered Wound: No Peripheral Venous Disease, Type II Diabetes, End Stage Renal Disease, Osteoarthritis, Osteomyelitis, Neuropathy, Received Radiation Photos Photo Uploaded By: Mikeal Hawthorne on 02/23/2020 14:10:22 Wound Measurements Length: (cm) 4.1 Width: (cm) 1.3 Depth: (cm) 0.2 Area: (cm) 4.186 Volume: (cm) 0.837 % Reduction in Area: 17.2% % Reduction in Volume: 44.8% Epithelialization: None Undermining: No Wound Description Classification: Unable to visualize wound bed Wound Margin: Epibole Exudate Amount: Medium Exudate Type: Serosanguineous Exudate Color: red, brown Foul Odor After Cleansing: No Slough/Fibrino Yes Wound Bed Granulation Amount: Medium (34-66%) Exposed  Structure Granulation Quality: Pink Fascia Exposed: No Necrotic Amount: Medium (34-66%) Fat Layer (Subcutaneous Tissue) Exposed: Yes Necrotic Quality: Adherent Slough Tendon Exposed: No Muscle Exposed: No Joint Exposed: No Bone Exposed: No Treatment Notes Wound #18 (Right, Proximal, Lateral Foot) 1. Cleanse With  Wound Cleanser 3. Primary Dressing Applied Santyl 4. Secondary Dressing Dry Gauze Roll Gauze 5. Secured With Recruitment consultant) Signed: 02/22/2020 5:17:00 PM By: [name] RN, BSN Entered By: [name] on 02/22/2020 13:23:38 -------------------------------------------------------------------------------- Vitals Details Patient Name: Date of Service: Pamela Pilon, DO NNA J. 02/22/2020 12:30 PM Medical Record Number: [id number] Patient Account Number: [id number] Date of Birth/Sex: Treating RN: 17-Mar-1946 (74 y.o. [name] Primary Care Jayleon Mcfarlane: [name] Other Clinician: Referring Karstyn Birkey: Treating Liza Czerwinski/Extender: Azucena Kuba in Treatment: 3 Vital Signs Time Taken: 13:16 Temperature (F): 98.4 Height (in): 64 Pulse (bpm): 81 Weight (lbs): 150 Respiratory Rate (breaths/min): 16 Body Mass Index (BMI): 25.7 Blood Pressure (mmHg): 123/72 Capillary Blood Glucose (mg/dl): 124 Reference Range: 80 - 120 mg / dl Notes glucose per pt report Electronic Signature(s) Signed: 02/22/2020 5:17:00 PM By: [name] RN, BSN Entered By: [name] on 02/22/2020 13:17:14

## 2020-02-24 ENCOUNTER — Ambulatory Visit (HOSPITAL_COMMUNITY)
Admission: RE | Admit: 2020-02-24 | Discharge: 2020-02-24 | Disposition: A | Discharge status: Home / Self Care | Payer: Medicare Other | Attending: Vascular Surgery | Admitting: Vascular Surgery

## 2020-02-24 ENCOUNTER — Encounter (HOSPITAL_COMMUNITY)
Admission: RE | Disposition: A | Discharge status: Home / Self Care | Payer: Self-pay | Source: Home / Self Care | Attending: Vascular Surgery

## 2020-02-24 ENCOUNTER — Other Ambulatory Visit: Payer: Self-pay

## 2020-02-24 DIAGNOSIS — I2510 Atherosclerotic heart disease of native coronary artery without angina pectoris: Secondary | ICD-10-CM | POA: Diagnosis not present

## 2020-02-24 DIAGNOSIS — Z794 Long term (current) use of insulin: Secondary | ICD-10-CM | POA: Insufficient documentation

## 2020-02-24 DIAGNOSIS — I70235 Atherosclerosis of native arteries of right leg with ulceration of other part of foot: Secondary | ICD-10-CM | POA: Insufficient documentation

## 2020-02-24 DIAGNOSIS — Z79899 Other long term (current) drug therapy: Secondary | ICD-10-CM | POA: Insufficient documentation

## 2020-02-24 DIAGNOSIS — E11621 Type 2 diabetes mellitus with foot ulcer: Secondary | ICD-10-CM | POA: Insufficient documentation

## 2020-02-24 DIAGNOSIS — I70234 Atherosclerosis of native arteries of right leg with ulceration of heel and midfoot: Secondary | ICD-10-CM | POA: Diagnosis not present

## 2020-02-24 DIAGNOSIS — Z888 Allergy status to other drugs, medicaments and biological substances status: Secondary | ICD-10-CM | POA: Diagnosis not present

## 2020-02-24 DIAGNOSIS — L97412 Non-pressure chronic ulcer of right heel and midfoot with fat layer exposed: Secondary | ICD-10-CM | POA: Diagnosis not present

## 2020-02-24 DIAGNOSIS — L97519 Non-pressure chronic ulcer of other part of right foot with unspecified severity: Secondary | ICD-10-CM | POA: Diagnosis not present

## 2020-02-24 DIAGNOSIS — Z91040 Latex allergy status: Secondary | ICD-10-CM | POA: Diagnosis not present

## 2020-02-24 DIAGNOSIS — Z7982 Long term (current) use of aspirin: Secondary | ICD-10-CM | POA: Insufficient documentation

## 2020-02-24 DIAGNOSIS — Z91041 Radiographic dye allergy status: Secondary | ICD-10-CM | POA: Insufficient documentation

## 2020-02-24 DIAGNOSIS — E1140 Type 2 diabetes mellitus with diabetic neuropathy, unspecified: Secondary | ICD-10-CM | POA: Diagnosis not present

## 2020-02-24 DIAGNOSIS — E1151 Type 2 diabetes mellitus with diabetic peripheral angiopathy without gangrene: Secondary | ICD-10-CM | POA: Diagnosis not present

## 2020-02-24 DIAGNOSIS — S91301D Unspecified open wound, right foot, subsequent encounter: Secondary | ICD-10-CM | POA: Diagnosis not present

## 2020-02-24 HISTORY — PX: ABDOMINAL AORTOGRAM W/LOWER EXTREMITY: CATH118223

## 2020-02-24 LAB — POCT I-STAT, CHEM 8
BUN: 40 mg/dL — ABNORMAL HIGH (ref 8–23)
Calcium, Ion: 1.04 mmol/L — ABNORMAL LOW (ref 1.15–1.40)
Chloride: 98 mmol/L (ref 98–111)
Creatinine, Ser: 3.9 mg/dL — ABNORMAL HIGH (ref 0.44–1.00)
Glucose, Bld: 87 mg/dL (ref 70–99)
HCT: 42 % (ref 36.0–46.0)
Hemoglobin: 14.3 g/dL (ref 12.0–15.0)
Potassium: 4.3 mmol/L (ref 3.5–5.1)
Sodium: 139 mmol/L (ref 135–145)
TCO2: 32 mmol/L (ref 22–32)

## 2020-02-24 LAB — GLUCOSE, CAPILLARY: Glucose-Capillary: 81 mg/dL (ref 70–99)

## 2020-02-24 SURGERY — ABDOMINAL AORTOGRAM W/LOWER EXTREMITY
Anesthesia: LOCAL | Laterality: Bilateral

## 2020-02-24 MED ORDER — SODIUM CHLORIDE 0.9 % IV SOLN: 250.0000 mL | INTRAVENOUS | Status: DC | PRN

## 2020-02-24 MED ORDER — SODIUM CHLORIDE 0.9% FLUSH: 3.0000 mL | Freq: Two times a day (BID) | INTRAVENOUS | Status: DC

## 2020-02-24 MED ORDER — SODIUM CHLORIDE 0.9% FLUSH: 3.0000 mL | INTRAVENOUS | Status: DC | PRN

## 2020-02-24 MED ORDER — LIDOCAINE HCL (PF) 1 % IJ SOLN
INTRAMUSCULAR | Status: AC
  Filled 2020-02-24: qty 30

## 2020-02-24 MED ORDER — DIPHENHYDRAMINE HCL 50 MG/ML IJ SOLN
25.0000 mg | Freq: Once | INTRAMUSCULAR | Status: AC
  Administered 2020-02-24: 25 mg via INTRAVENOUS

## 2020-02-24 MED ORDER — MIDAZOLAM HCL 2 MG/2ML IJ SOLN
INTRAMUSCULAR | Status: DC | PRN
  Administered 2020-02-24: 1 mg via INTRAVENOUS

## 2020-02-24 MED ORDER — ACETAMINOPHEN 325 MG PO TABS: 650.0000 mg | ORAL_TABLET | ORAL | Status: DC | PRN

## 2020-02-24 MED ORDER — DIPHENHYDRAMINE HCL 50 MG/ML IJ SOLN
INTRAMUSCULAR | Status: AC
  Filled 2020-02-24: qty 1

## 2020-02-24 MED ORDER — HEPARIN (PORCINE) IN NACL 1000-0.9 UT/500ML-% IV SOLN
INTRAVENOUS | Status: AC
  Filled 2020-02-24: qty 1000

## 2020-02-24 MED ORDER — FENTANYL CITRATE (PF) 100 MCG/2ML IJ SOLN
INTRAMUSCULAR | Status: AC
  Filled 2020-02-24: qty 2

## 2020-02-24 MED ORDER — FENTANYL CITRATE (PF) 100 MCG/2ML IJ SOLN
INTRAMUSCULAR | Status: DC | PRN
  Administered 2020-02-24: 50 ug via INTRAVENOUS

## 2020-02-24 MED ORDER — MIDAZOLAM HCL 2 MG/2ML IJ SOLN
INTRAMUSCULAR | Status: AC
  Filled 2020-02-24: qty 2

## 2020-02-24 MED ORDER — ONDANSETRON HCL 4 MG/2ML IJ SOLN: 4.0000 mg | Freq: Four times a day (QID) | INTRAMUSCULAR | Status: DC | PRN

## 2020-02-24 MED ORDER — HEPARIN (PORCINE) IN NACL 1000-0.9 UT/500ML-% IV SOLN
INTRAVENOUS | Status: DC | PRN
  Administered 2020-02-24 (×2): 500 mL

## 2020-02-24 MED ORDER — IOHEXOL 350 MG/ML SOLN
INTRAVENOUS | Status: DC | PRN
  Administered 2020-02-24: 161 mL via INTRA_ARTERIAL

## 2020-02-24 MED ORDER — HYDRALAZINE HCL 20 MG/ML IJ SOLN: 5.0000 mg | INTRAMUSCULAR | Status: DC | PRN

## 2020-02-24 MED ORDER — METHYLPREDNISOLONE SODIUM SUCC 125 MG IJ SOLR
INTRAMUSCULAR | Status: AC
  Filled 2020-02-24: qty 2

## 2020-02-24 MED ORDER — METHYLPREDNISOLONE SODIUM SUCC 125 MG IJ SOLR
125.0000 mg | Freq: Once | INTRAMUSCULAR | Status: AC
  Administered 2020-02-24: 125 mg via INTRAVENOUS

## 2020-02-24 MED ORDER — LIDOCAINE HCL (PF) 1 % IJ SOLN
INTRAMUSCULAR | Status: DC | PRN
  Administered 2020-02-24: 15 mL via SUBCUTANEOUS

## 2020-02-24 SURGICAL SUPPLY — 15 items
CATH OMNI FLUSH 5F 65CM (CATHETERS) ×1 IMPLANT
CATH STRAIGHT 5FR 65CM (CATHETERS) ×1 IMPLANT
DEVICE CLOSURE MYNXGRIP 5F (Vascular Products) ×1 IMPLANT
DEVICE TORQUE .025-.038 (MISCELLANEOUS) ×1 IMPLANT
GLIDEWIRE ADV .035X180CM (WIRE) ×1 IMPLANT
GUIDEWIRE ANGLED .035X150CM (WIRE) ×1 IMPLANT
KIT MICROPUNCTURE NIT STIFF (SHEATH) ×1 IMPLANT
KIT PV (KITS) ×2 IMPLANT
SHEATH PINNACLE 5F 10CM (SHEATH) ×1 IMPLANT
SHEATH PROBE COVER 6X72 (BAG) ×1 IMPLANT
SYR MEDRAD MARK 7 150ML (SYRINGE) ×2 IMPLANT
TRANSDUCER W/STOPCOCK (MISCELLANEOUS) ×2 IMPLANT
TRAY PV CATH (CUSTOM PROCEDURE TRAY) ×2 IMPLANT
WIRE BENTSON .035X145CM (WIRE) ×1 IMPLANT
WIRE ROSEN-J .035X180CM (WIRE) ×1 IMPLANT

## 2020-02-24 NOTE — Interval H&P Note (Signed)
History and Physical Interval Note:  02/24/2020 10:27 AM  [name]  has presented today for surgery, with the diagnosis of right foot critical limb ischemia.  The various methods of treatment have been discussed with the patient and family. After consideration of risks, benefits and other options for treatment, the patient has consented to  Procedure(s): ABDOMINAL AORTOGRAM W/LOWER EXTREMITY (Bilateral) as a surgical intervention.  The patient's history has been reviewed, patient examined, no change in status, stable for surgery.  I have reviewed the patient's chart and labs.  Questions were answered to the patient's satisfaction.     Cherre Robins

## 2020-02-24 NOTE — Discharge Instructions (Signed)

## 2020-02-24 NOTE — Op Note (Addendum)
DATE OF SERVICE: 02/24/2020  PATIENT:  [name]  74 y.o. 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 US guided LCFA access 2. Aortogram, bilateral lower extremity angiography, right lower extremity selective angiography (161cc contrast) 3. Conscious sedation (61 min)  SURGEON:  Surgeon(s) and Role:    * Jentry Mcqueary, Yevonne Aline, MD - Primary  ASSISTANT: none  ANESTHESIA:   local  EBL:  min  BLOOD ADMINISTERED:none  DRAINS: none   LOCAL MEDICATIONS USED:  NONE  SPECIMEN:  none  DISPOSITION OF SPECIMEN:  n/a  COUNTS: confirmed correct.  TOURNIQUET:  * No tourniquets in log *  PLAN OF CARE: Discharge to home after PACU  PATIENT DISPOSITION:  PACU - hemodynamically stable.   Delay start of Pharmacological VTE agent (>24hrs) due to surgical blood loss or risk of bleeding: no  INDICATION FOR PROCEDURE: [name] is a 74 y.o. female with critical limb ischemia of the right lower extremity with tissue loss. After careful discussion of risks, benefits, and alternatives the patient was offered angiography. The patient understood and wished to proceed.  DESCRIPTION OF PROCEDURE: After identification of the patient in the pre-operative holding area, the patient was transferred to the operating room. The patient was positioned supine on the operating room table. Anesthesia was induced. The groins was prepped and draped in standard fashion. A surgical pause was performed confirming correct patient, procedure, and operative location.  To begin the left groin was anesthetized with 1% lidocaine. Using ultrasound guidance, the left common femoral artery was accessed with micropuncture technique above the previous bypass. Fluoroscopy was used to confirm cannulation over the femoral head. Sheathogram was not performed. The 315F sheath was upsized to 15F.   An 035 angled  glidewire was advanced into the distal aorta. Over the wire an omni flush catheter was advanced to the level of L2. Aortogram was performed, revealing mild atherosclerosis without flow limiting stenosis.   The left common iliac artery was selected with an 035 floppy angled glidewire. The wire was advanced into the common femoral artery but would not advance beyond this point. A straight flush catheter was advanced into the right common iliac artery. Selective right lower extremity angiography was performed revealing:  Subtotal right common femoral occlusive plaque Right superficial femoral artery occluded throughout its length Reconstitution of small island of behind-knee right popliteal artery Right anterior tibial artery reconstitutes near its origin with near normal flow to the foot.  A mynx device was used to close the arteriotomy. Hemostasis was excellent upon completion.  Upon completion of the case instrument and sharps counts were confirmed correct. The patient was transferred to the PACU in good condition. I was present for all portions of the procedure.  Yevonne Aline. Stanford Breed, MD [facility] Phone Number: [phone] 02/24/2020 2:17 PM

## 2020-02-24 NOTE — Progress Notes (Signed)
Ambulated without bleeding or difficulty. Discharged home with husband who will drive and stay with her tonight

## 2020-02-25 DIAGNOSIS — N2581 Secondary hyperparathyroidism of renal origin: Secondary | ICD-10-CM | POA: Diagnosis not present

## 2020-02-25 DIAGNOSIS — D509 Iron deficiency anemia, unspecified: Secondary | ICD-10-CM | POA: Diagnosis not present

## 2020-02-25 DIAGNOSIS — N186 End stage renal disease: Secondary | ICD-10-CM | POA: Diagnosis not present

## 2020-02-25 DIAGNOSIS — D631 Anemia in chronic kidney disease: Secondary | ICD-10-CM | POA: Diagnosis not present

## 2020-02-25 DIAGNOSIS — Z992 Dependence on renal dialysis: Secondary | ICD-10-CM | POA: Diagnosis not present

## 2020-02-27 ENCOUNTER — Encounter (HOSPITAL_COMMUNITY): Payer: Self-pay | Admitting: Vascular Surgery

## 2020-02-27 ENCOUNTER — Other Ambulatory Visit: Payer: Self-pay | Admitting: *Deleted

## 2020-02-27 DIAGNOSIS — N186 End stage renal disease: Secondary | ICD-10-CM | POA: Diagnosis not present

## 2020-02-27 DIAGNOSIS — N2581 Secondary hyperparathyroidism of renal origin: Secondary | ICD-10-CM | POA: Diagnosis not present

## 2020-02-27 DIAGNOSIS — D631 Anemia in chronic kidney disease: Secondary | ICD-10-CM | POA: Diagnosis not present

## 2020-02-27 DIAGNOSIS — D509 Iron deficiency anemia, unspecified: Secondary | ICD-10-CM | POA: Diagnosis not present

## 2020-02-27 DIAGNOSIS — Z992 Dependence on renal dialysis: Secondary | ICD-10-CM | POA: Diagnosis not present

## 2020-02-27 MED ORDER — "INSULIN SYRINGE-NEEDLE U-100 31G X 5/16"" 1 ML MISC": 1 refills | Status: DC

## 2020-02-28 ENCOUNTER — Other Ambulatory Visit: Payer: Self-pay

## 2020-02-29 DIAGNOSIS — N2581 Secondary hyperparathyroidism of renal origin: Secondary | ICD-10-CM | POA: Diagnosis not present

## 2020-02-29 DIAGNOSIS — Z992 Dependence on renal dialysis: Secondary | ICD-10-CM | POA: Diagnosis not present

## 2020-02-29 DIAGNOSIS — D631 Anemia in chronic kidney disease: Secondary | ICD-10-CM | POA: Diagnosis not present

## 2020-02-29 DIAGNOSIS — D509 Iron deficiency anemia, unspecified: Secondary | ICD-10-CM | POA: Diagnosis not present

## 2020-02-29 DIAGNOSIS — N186 End stage renal disease: Secondary | ICD-10-CM | POA: Diagnosis not present

## 2020-03-03 DIAGNOSIS — D509 Iron deficiency anemia, unspecified: Secondary | ICD-10-CM | POA: Diagnosis not present

## 2020-03-03 DIAGNOSIS — Z992 Dependence on renal dialysis: Secondary | ICD-10-CM | POA: Diagnosis not present

## 2020-03-03 DIAGNOSIS — N2581 Secondary hyperparathyroidism of renal origin: Secondary | ICD-10-CM | POA: Diagnosis not present

## 2020-03-03 DIAGNOSIS — D631 Anemia in chronic kidney disease: Secondary | ICD-10-CM | POA: Diagnosis not present

## 2020-03-03 DIAGNOSIS — N186 End stage renal disease: Secondary | ICD-10-CM | POA: Diagnosis not present

## 2020-03-04 ENCOUNTER — Other Ambulatory Visit: Payer: Self-pay | Admitting: Internal Medicine

## 2020-03-04 DIAGNOSIS — Z794 Long term (current) use of insulin: Secondary | ICD-10-CM

## 2020-03-04 DIAGNOSIS — N186 End stage renal disease: Secondary | ICD-10-CM

## 2020-03-04 MED ORDER — "INSULIN SYRINGE-NEEDLE U-100 31G X 5/16"" 1 ML MISC": 0 refills | Status: DC

## 2020-03-05 DIAGNOSIS — E11621 Type 2 diabetes mellitus with foot ulcer: Secondary | ICD-10-CM | POA: Diagnosis not present

## 2020-03-05 DIAGNOSIS — S91301D Unspecified open wound, right foot, subsequent encounter: Secondary | ICD-10-CM | POA: Diagnosis not present

## 2020-03-05 DIAGNOSIS — I2510 Atherosclerotic heart disease of native coronary artery without angina pectoris: Secondary | ICD-10-CM | POA: Diagnosis not present

## 2020-03-05 DIAGNOSIS — E1140 Type 2 diabetes mellitus with diabetic neuropathy, unspecified: Secondary | ICD-10-CM | POA: Diagnosis not present

## 2020-03-05 DIAGNOSIS — L97412 Non-pressure chronic ulcer of right heel and midfoot with fat layer exposed: Secondary | ICD-10-CM | POA: Diagnosis not present

## 2020-03-05 DIAGNOSIS — E1151 Type 2 diabetes mellitus with diabetic peripheral angiopathy without gangrene: Secondary | ICD-10-CM | POA: Diagnosis not present

## 2020-03-06 ENCOUNTER — Other Ambulatory Visit: Payer: Self-pay | Admitting: Internal Medicine

## 2020-03-06 DIAGNOSIS — N186 End stage renal disease: Secondary | ICD-10-CM | POA: Diagnosis not present

## 2020-03-06 DIAGNOSIS — Z794 Long term (current) use of insulin: Secondary | ICD-10-CM

## 2020-03-06 DIAGNOSIS — E119 Type 2 diabetes mellitus without complications: Secondary | ICD-10-CM

## 2020-03-06 DIAGNOSIS — N2581 Secondary hyperparathyroidism of renal origin: Secondary | ICD-10-CM | POA: Diagnosis not present

## 2020-03-06 DIAGNOSIS — D631 Anemia in chronic kidney disease: Secondary | ICD-10-CM | POA: Diagnosis not present

## 2020-03-06 DIAGNOSIS — Z992 Dependence on renal dialysis: Secondary | ICD-10-CM | POA: Diagnosis not present

## 2020-03-06 DIAGNOSIS — D509 Iron deficiency anemia, unspecified: Secondary | ICD-10-CM | POA: Diagnosis not present

## 2020-03-06 MED ORDER — "INSULIN SYRINGE-NEEDLE U-100 31G X 5/16"" 1 ML MISC": 0 refills | Status: DC

## 2020-03-07 ENCOUNTER — Other Ambulatory Visit: Payer: Self-pay | Admitting: Internal Medicine

## 2020-03-07 ENCOUNTER — Encounter (HOSPITAL_BASED_OUTPATIENT_CLINIC_OR_DEPARTMENT_OTHER): Payer: Medicare Other | Attending: Physician Assistant | Admitting: Physician Assistant

## 2020-03-07 DIAGNOSIS — E1122 Type 2 diabetes mellitus with diabetic chronic kidney disease: Secondary | ICD-10-CM | POA: Diagnosis not present

## 2020-03-07 DIAGNOSIS — N186 End stage renal disease: Secondary | ICD-10-CM | POA: Diagnosis not present

## 2020-03-07 DIAGNOSIS — Z992 Dependence on renal dialysis: Secondary | ICD-10-CM | POA: Diagnosis not present

## 2020-03-08 DIAGNOSIS — D509 Iron deficiency anemia, unspecified: Secondary | ICD-10-CM | POA: Diagnosis not present

## 2020-03-08 DIAGNOSIS — D631 Anemia in chronic kidney disease: Secondary | ICD-10-CM | POA: Diagnosis not present

## 2020-03-08 DIAGNOSIS — Z992 Dependence on renal dialysis: Secondary | ICD-10-CM | POA: Diagnosis not present

## 2020-03-08 DIAGNOSIS — N186 End stage renal disease: Secondary | ICD-10-CM | POA: Diagnosis not present

## 2020-03-08 DIAGNOSIS — N2581 Secondary hyperparathyroidism of renal origin: Secondary | ICD-10-CM | POA: Diagnosis not present

## 2020-03-09 DIAGNOSIS — E1151 Type 2 diabetes mellitus with diabetic peripheral angiopathy without gangrene: Secondary | ICD-10-CM | POA: Diagnosis not present

## 2020-03-09 DIAGNOSIS — E11621 Type 2 diabetes mellitus with foot ulcer: Secondary | ICD-10-CM | POA: Diagnosis not present

## 2020-03-09 DIAGNOSIS — I2510 Atherosclerotic heart disease of native coronary artery without angina pectoris: Secondary | ICD-10-CM | POA: Diagnosis not present

## 2020-03-09 DIAGNOSIS — S91301D Unspecified open wound, right foot, subsequent encounter: Secondary | ICD-10-CM | POA: Diagnosis not present

## 2020-03-09 DIAGNOSIS — L97412 Non-pressure chronic ulcer of right heel and midfoot with fat layer exposed: Secondary | ICD-10-CM | POA: Diagnosis not present

## 2020-03-09 DIAGNOSIS — E1140 Type 2 diabetes mellitus with diabetic neuropathy, unspecified: Secondary | ICD-10-CM | POA: Diagnosis not present

## 2020-03-10 DIAGNOSIS — D509 Iron deficiency anemia, unspecified: Secondary | ICD-10-CM | POA: Diagnosis not present

## 2020-03-10 DIAGNOSIS — N2581 Secondary hyperparathyroidism of renal origin: Secondary | ICD-10-CM | POA: Diagnosis not present

## 2020-03-10 DIAGNOSIS — N186 End stage renal disease: Secondary | ICD-10-CM | POA: Diagnosis not present

## 2020-03-10 DIAGNOSIS — Z992 Dependence on renal dialysis: Secondary | ICD-10-CM | POA: Diagnosis not present

## 2020-03-10 DIAGNOSIS — D631 Anemia in chronic kidney disease: Secondary | ICD-10-CM | POA: Diagnosis not present

## 2020-03-13 ENCOUNTER — Other Ambulatory Visit (HOSPITAL_COMMUNITY)
Admission: RE | Admit: 2020-03-13 | Discharge: 2020-03-13 | Disposition: A | Discharge status: Home / Self Care | Payer: Medicare Other | Source: Ambulatory Visit | Attending: Vascular Surgery | Admitting: Vascular Surgery

## 2020-03-13 DIAGNOSIS — Z7982 Long term (current) use of aspirin: Secondary | ICD-10-CM | POA: Diagnosis not present

## 2020-03-13 DIAGNOSIS — E1122 Type 2 diabetes mellitus with diabetic chronic kidney disease: Secondary | ICD-10-CM | POA: Diagnosis not present

## 2020-03-13 DIAGNOSIS — E1151 Type 2 diabetes mellitus with diabetic peripheral angiopathy without gangrene: Secondary | ICD-10-CM | POA: Diagnosis not present

## 2020-03-13 DIAGNOSIS — Z4800 Encounter for change or removal of nonsurgical wound dressing: Secondary | ICD-10-CM | POA: Diagnosis not present

## 2020-03-13 DIAGNOSIS — I2510 Atherosclerotic heart disease of native coronary artery without angina pectoris: Secondary | ICD-10-CM | POA: Diagnosis not present

## 2020-03-13 DIAGNOSIS — Z794 Long term (current) use of insulin: Secondary | ICD-10-CM | POA: Diagnosis not present

## 2020-03-13 DIAGNOSIS — Z79899 Other long term (current) drug therapy: Secondary | ICD-10-CM | POA: Diagnosis not present

## 2020-03-13 DIAGNOSIS — N2581 Secondary hyperparathyroidism of renal origin: Secondary | ICD-10-CM | POA: Diagnosis not present

## 2020-03-13 DIAGNOSIS — Z9181 History of falling: Secondary | ICD-10-CM | POA: Diagnosis not present

## 2020-03-13 DIAGNOSIS — L97412 Non-pressure chronic ulcer of right heel and midfoot with fat layer exposed: Secondary | ICD-10-CM | POA: Diagnosis not present

## 2020-03-13 DIAGNOSIS — I110 Hypertensive heart disease with heart failure: Secondary | ICD-10-CM | POA: Diagnosis not present

## 2020-03-13 DIAGNOSIS — Z01812 Encounter for preprocedural laboratory examination: Secondary | ICD-10-CM | POA: Insufficient documentation

## 2020-03-13 DIAGNOSIS — D631 Anemia in chronic kidney disease: Secondary | ICD-10-CM | POA: Diagnosis not present

## 2020-03-13 DIAGNOSIS — Z992 Dependence on renal dialysis: Secondary | ICD-10-CM | POA: Diagnosis not present

## 2020-03-13 DIAGNOSIS — I132 Hypertensive heart and chronic kidney disease with heart failure and with stage 5 chronic kidney disease, or end stage renal disease: Secondary | ICD-10-CM | POA: Diagnosis not present

## 2020-03-13 DIAGNOSIS — Z7984 Long term (current) use of oral hypoglycemic drugs: Secondary | ICD-10-CM | POA: Diagnosis not present

## 2020-03-13 DIAGNOSIS — Z20822 Contact with and (suspected) exposure to covid-19: Secondary | ICD-10-CM | POA: Insufficient documentation

## 2020-03-13 DIAGNOSIS — E11621 Type 2 diabetes mellitus with foot ulcer: Secondary | ICD-10-CM | POA: Diagnosis not present

## 2020-03-13 DIAGNOSIS — S91301D Unspecified open wound, right foot, subsequent encounter: Secondary | ICD-10-CM | POA: Diagnosis not present

## 2020-03-13 DIAGNOSIS — E1140 Type 2 diabetes mellitus with diabetic neuropathy, unspecified: Secondary | ICD-10-CM | POA: Diagnosis not present

## 2020-03-13 DIAGNOSIS — N186 End stage renal disease: Secondary | ICD-10-CM | POA: Diagnosis not present

## 2020-03-13 DIAGNOSIS — D509 Iron deficiency anemia, unspecified: Secondary | ICD-10-CM | POA: Diagnosis not present

## 2020-03-13 DIAGNOSIS — I509 Heart failure, unspecified: Secondary | ICD-10-CM | POA: Diagnosis not present

## 2020-03-13 LAB — SARS CORONAVIRUS 2 (TAT 6-24 HRS): SARS Coronavirus 2: NEGATIVE

## 2020-03-14 ENCOUNTER — Other Ambulatory Visit: Payer: Self-pay

## 2020-03-14 ENCOUNTER — Encounter (HOSPITAL_COMMUNITY): Payer: Self-pay

## 2020-03-14 ENCOUNTER — Encounter (HOSPITAL_COMMUNITY)
Admission: RE | Admit: 2020-03-14 | Discharge: 2020-03-14 | Disposition: A | Discharge status: Home / Self Care | Payer: Medicare Other | Source: Ambulatory Visit | Attending: Vascular Surgery | Admitting: Vascular Surgery

## 2020-03-14 DIAGNOSIS — E1122 Type 2 diabetes mellitus with diabetic chronic kidney disease: Secondary | ICD-10-CM | POA: Diagnosis not present

## 2020-03-14 DIAGNOSIS — L97412 Non-pressure chronic ulcer of right heel and midfoot with fat layer exposed: Secondary | ICD-10-CM | POA: Diagnosis not present

## 2020-03-14 DIAGNOSIS — S91301D Unspecified open wound, right foot, subsequent encounter: Secondary | ICD-10-CM | POA: Diagnosis not present

## 2020-03-14 DIAGNOSIS — Z01812 Encounter for preprocedural laboratory examination: Secondary | ICD-10-CM | POA: Insufficient documentation

## 2020-03-14 DIAGNOSIS — E1140 Type 2 diabetes mellitus with diabetic neuropathy, unspecified: Secondary | ICD-10-CM | POA: Diagnosis not present

## 2020-03-14 DIAGNOSIS — E1151 Type 2 diabetes mellitus with diabetic peripheral angiopathy without gangrene: Secondary | ICD-10-CM | POA: Diagnosis not present

## 2020-03-14 DIAGNOSIS — E11621 Type 2 diabetes mellitus with foot ulcer: Secondary | ICD-10-CM | POA: Diagnosis not present

## 2020-03-14 LAB — COMPREHENSIVE METABOLIC PANEL
ALT: 17 U/L (ref 0–44)
AST: 20 U/L (ref 15–41)
Albumin: 3.6 g/dL (ref 3.5–5.0)
Alkaline Phosphatase: 182 U/L — ABNORMAL HIGH (ref 38–126)
Anion gap: 13 (ref 5–15)
BUN: 37 mg/dL — ABNORMAL HIGH (ref 8–23)
CO2: 30 mmol/L (ref 22–32)
Calcium: 9 mg/dL (ref 8.9–10.3)
Chloride: 93 mmol/L — ABNORMAL LOW (ref 98–111)
Creatinine, Ser: 3.71 mg/dL — ABNORMAL HIGH (ref 0.44–1.00)
GFR, Estimated: 12 mL/min — ABNORMAL LOW (ref >60)
Glucose, Bld: 92 mg/dL (ref 70–99)
Potassium: 3.8 mmol/L (ref 3.5–5.1)
Sodium: 136 mmol/L (ref 135–145)
Total Bilirubin: 0.8 mg/dL (ref 0.3–1.2)
Total Protein: 6.5 g/dL (ref 6.5–8.1)

## 2020-03-14 LAB — CBC
HCT: 36.6 % (ref 36.0–46.0)
Hemoglobin: 12.5 g/dL (ref 12.0–15.0)
MCH: 33.4 pg (ref 26.0–34.0)
MCHC: 34.2 g/dL (ref 30.0–36.0)
MCV: 97.9 fL (ref 80.0–100.0)
Platelets: 151 10*3/uL (ref 150–400)
RBC: 3.74 MIL/uL — ABNORMAL LOW (ref 3.87–5.11)
RDW: 16.5 % — ABNORMAL HIGH (ref 11.5–15.5)
WBC: 7.8 10*3/uL (ref 4.0–10.5)
nRBC: 0 % (ref 0.0–0.2)

## 2020-03-14 LAB — GLUCOSE, CAPILLARY: Glucose-Capillary: 98 mg/dL (ref 70–99)

## 2020-03-14 LAB — PROTIME-INR
INR: 1.2 (ref 0.8–1.2)
Prothrombin Time: 14.6 seconds (ref 11.4–15.2)

## 2020-03-14 LAB — SURGICAL PCR SCREEN
MRSA, PCR: NEGATIVE
Staphylococcus aureus: NEGATIVE

## 2020-03-14 LAB — APTT: aPTT: 27 seconds (ref 24–36)

## 2020-03-14 NOTE — Pre-Procedure Instructions (Signed)
Your procedure is scheduled on Friday, March 16, 2020.  Report to [facility] Main Entrance "A" at 5:30 A.M., and check in at the Admitting office.  Call this number if you have problems the morning of surgery:  [phone]  Call [phone] if you have any questions prior to your surgery date Monday-Friday 8am-4pm    Remember:  Do not eat or drink after midnight the night before your surgery    Take these medicines the morning of surgery with A SIP OF WATER:  allopurinol (ZYLOPRIM) Aspirin atorvastatin (LIPITOR)  cinacalcet (SENSIPAR)  isosorbide dinitrate (ISORDIL) ARTIFICIAL TEAR SOLUTION OP - if needed   As of today, STOP taking any Aleve, Naproxen, Ibuprofen, Motrin, Advil, Goody's, BC's, all herbal medications, fish oil, and all vitamins.   How to Manage Your Diabetes Before and After Surgery  Why is it important to control my blood sugar before and after surgery? . Improving blood sugar levels before and after surgery helps healing and can limit problems. . A way of improving blood sugar control is eating a healthy diet by: o  Eating less sugar and carbohydrates o  Increasing activity/exercise o  Talking with your doctor about reaching your blood sugar goals . High blood sugars (greater than 180 mg/dL) can raise your risk of infections and slow your recovery, so you will need to focus on controlling your diabetes during the weeks before surgery. . Make sure that the doctor who takes care of your diabetes knows about your planned surgery including the date and location.  How do I manage my blood sugar before surgery? . Check your blood sugar at least 4 times a day, starting 2 days before surgery, to make sure that the level is not too high or low. o Check your blood sugar the morning of your surgery when you wake up and every 2 hours until you get to the Short Stay unit. . If your blood sugar is less than 70 mg/dL, you will need to treat for low blood sugar: o Do  not take insulin. o Treat a low blood sugar (less than 70 mg/dL) with  cup of clear juice (cranberry or apple), 4 glucose tablets, OR glucose gel. Recheck blood sugar in 15 minutes after treatment (to make sure it is greater than 70 mg/dL). If your blood sugar is not greater than 70 mg/dL on recheck, call [phone] o  for further instructions. . Report your blood sugar to the short stay nurse when you get to Short Stay.  . If you are admitted to the hospital after surgery: o Your blood sugar will be checked by the staff and you will probably be given insulin after surgery (instead of oral diabetes medicines) to make sure you have good blood sugar levels. o The goal for blood sugar control after surgery is 80-180 mg/dL.  WHAT DO I DO ABOUT MY DIABETES MEDICATION?   . If your CBG is greater than 220 mg/dL, you may take  of your sliding scale (correction) dose of insulin.                      Do not wear jewelry, make up, or nail polish            Do not wear lotions, powders, perfumes, or deodorant.            Do not shave 48 hours prior to surgery.            Do not bring valuables  to the hospital.            [facility] is not responsible for any belongings or valuables.  Do NOT Smoke (Tobacco/Vaping) or drink Alcohol 24 hours prior to your procedure If you use a CPAP at night, you may bring all equipment for your overnight stay.   Contacts, glasses, dentures or bridgework may not be worn into surgery.      For patients admitted to the hospital, discharge time will be determined by your treatment team.   Patients discharged the day of surgery will not be allowed to drive home, and someone needs to stay with them for 24 hours.    Special instructions:   [place]- Preparing For Surgery  Before surgery, you can play an important role. Because skin is not sterile, your skin needs to be as free of germs as possible. You can reduce the number of germs on your skin by washing  with CHG (chlorahexidine gluconate) Soap before surgery.  CHG is an antiseptic cleaner which kills germs and bonds with the skin to continue killing germs even after washing.    Oral Hygiene is also important to reduce your risk of infection.  Remember - BRUSH YOUR TEETH THE MORNING OF SURGERY WITH YOUR REGULAR TOOTHPASTE  Please do not use if you have an allergy to CHG or antibacterial soaps. If your skin becomes reddened/irritated stop using the CHG.  Do not shave (including legs and underarms) for at least 48 hours prior to first CHG shower. It is OK to shave your face.  Please follow these instructions carefully.   1. Shower the NIGHT BEFORE SURGERY and the MORNING OF SURGERY with CHG Soap.   2. If you chose to wash your hair, wash your hair first as usual with your normal shampoo.  3. After you shampoo, rinse your hair and body thoroughly to remove the shampoo.  4. Use CHG as you would any other liquid soap. You can apply CHG directly to the skin and wash gently with a scrungie or a clean washcloth.   5. Apply the CHG Soap to your body ONLY FROM THE NECK DOWN.  Do not use on open wounds or open sores. Avoid contact with your eyes, ears, mouth and genitals (private parts). Wash Face and genitals (private parts)  with your normal soap.   6. Wash thoroughly, paying special attention to the area where your surgery will be performed.  7. Thoroughly rinse your body with warm water from the neck down.  8. DO NOT shower/wash with your normal soap after using and rinsing off the CHG Soap.  9. Pat yourself dry with a CLEAN TOWEL.  10. Wear CLEAN PAJAMAS to bed the night before surgery  11. Place CLEAN SHEETS on your bed the night of your first shower and DO NOT SLEEP WITH PETS.   Day of Surgery: Wear Clean/Comfortable clothing the morning of surgery Do not apply any deodorants/lotions.   Remember to brush your teeth WITH YOUR REGULAR TOOTHPASTE.   Please read over the following fact  sheets that you were given.

## 2020-03-14 NOTE — Progress Notes (Signed)
PCP - Dr. Unk Pinto Cardiologist - Dr. Jenkins Rouge  PPM/ICD - Denies  Chest x-ray - N/A EKG - 02/24/20 Stress Test - 05/21/17  ECHO - 11/27/17 Cardiac Cath - Denies  Sleep Study - Denies  Fasting Blood Sugar - 80's-120's Checks Blood Sugar __1-2___ times a day  Blood Thinner Instructions: N/A Aspirin Instructions: Continue ASA  ERAS Protcol - No  COVID TEST- 03/13/20   Coronavirus Screening  Have you experienced the following symptoms:  Cough yes/no: No Fever (>100.59F)  yes/no: No Runny nose yes/no: No Sore throat yes/no: No Difficulty breathing/shortness of breath  yes/no: No  Have you or a family member traveled in the last 14 days and where? yes/no: No   If the patient indicates "YES" to the above questions, their PAT will be rescheduled to limit the exposure to others and, the surgeon will be notified. THE PATIENT WILL NEED TO BE ASYMPTOMATIC FOR 14 DAYS.   If the patient is not experiencing any of these symptoms, the PAT nurse will instruct them to NOT bring anyone with them to their appointment since they may have these symptoms or traveled as well.   Please remind your patients and families that hospital visitation restrictions are in effect and the importance of the restrictions.     Anesthesia review: Yes, cardiac hx; abnormal EKG  Patient denies shortness of breath, fever, cough and chest pain at PAT appointment   All instructions explained to the patient, with a verbal understanding of the material. Patient agrees to go over the instructions while at home for a better understanding. Patient also instructed to self quarantine after being tested for COVID-19. The opportunity to ask questions was provided.

## 2020-03-14 NOTE — Pre-Procedure Instructions (Signed)
[place]  03/14/2020      [facility] - [place], [place] [address] Phone: [phone] Fax: [phone]  [facility] [address], [place] - [address] [place] [place] [address] Phone: [phone] Fax: [phone]  [facility] - [place], [place] - [address], [address] [place] [address] Phone: [phone] Fax: [phone]    Your procedure is scheduled on Dec. 10  Report to [facility] entrance A at 5:30 A.M.  Call this number if you have problems the morning of surgery:  [phone]   Remember:  Do not eat or drink after midnight.      Take these medicines the morning of surgery with A SIP OF WATER :              allopurinol (zyloprim)             Aspirin             Atorvastatin (lipitor)             cinacalcet (sensipar)             Docusate sodium (colace)             Hydralazine (apresoline)             Isosorbide (isordil)                          7 days prior to surgery STOP taking  Aleve, Naproxen, Ibuprofen, Motrin, Advil, Goody's, BC's, all herbal medications, fish oil, and all vitamins.                      How to Manage Your Diabetes Before and After Surgery  Why is it important to control my blood sugar before and after surgery? . Improving blood sugar levels before and after surgery helps healing and can limit problems. . A way of improving blood sugar control is eating a healthy diet by: o  Eating less sugar and carbohydrates o  Increasing activity/exercise o  Talking with your doctor about reaching your blood sugar goals . High blood sugars (greater than 180 mg/dL) can raise your risk of infections and slow your recovery, so you will need to focus on controlling your diabetes during the weeks before surgery. . Make sure that the doctor who takes care of your diabetes  knows about your planned surgery including the date and location.  How do I manage my blood sugar before surgery? . Check your blood sugar at least 4 times a day, starting 2 days before surgery, to make sure that the level is not too high or low. o Check your blood sugar the morning of your surgery when you wake up and every 2 hours until you get to the Short Stay unit. . If your blood sugar is less than 70 mg/dL, you will need to treat for low blood sugar: o Do not take insulin. o Treat a low blood sugar (less than 70 mg/dL) with  cup of clear juice (cranberry or apple), 4 glucose tablets, OR glucose gel. Recheck blood sugar in 15 minutes after treatment (to make sure it is greater than 70 mg/dL). If your blood sugar is not greater than 70 mg/dL on recheck, call [phone] o  for further instructions. . Report your blood sugar to the  short stay nurse when you get to Short Stay.  . If you are admitted to the hospital after surgery: o Your blood sugar will be checked by the staff and you will probably be given insulin after surgery (instead of oral diabetes medicines) to make sure you have good blood sugar levels. o The goal for blood sugar control after surgery is 80-180 mg/dL.       WHAT DO I DO ABOUT MY DIABETES MEDICATION?    . If your CBG is greater than 220 mg/dL, you may take  of your sliding scale (correction) dose of insulin.     Do not wear jewelry, make-up or nail polish.  Do not wear lotions, powders, or perfumes, or deodorant.  Do not shave 48 hours prior to surgery.  Men may shave face and neck.  Do not bring valuables to the hospital.  [facility] is not responsible for any belongings or valuables.  Contacts, dentures or bridgework may not be worn into surgery.  Leave your suitcase in the car.  After surgery it may be brought to your room.  For patients admitted to the hospital, discharge time will be determined by your treatment team.  Patients discharged the  day of surgery will not be allowed to drive home.    Special instructions:   [place]- Preparing For Surgery  Before surgery, you can play an important role. Because skin is not sterile, your skin needs to be as free of germs as possible. You can reduce the number of germs on your skin by washing with CHG (chlorahexidine gluconate) Soap before surgery.  CHG is an antiseptic cleaner which kills germs and bonds with the skin to continue killing germs even after washing.    Oral Hygiene is also important to reduce your risk of infection.  Remember - BRUSH YOUR TEETH THE MORNING OF SURGERY WITH YOUR REGULAR TOOTHPASTE  Please do not use if you have an allergy to CHG or antibacterial soaps. If your skin becomes reddened/irritated stop using the CHG.  Do not shave (including legs and underarms) for at least 48 hours prior to first CHG shower. It is OK to shave your face.  Please follow these instructions carefully.   1. Shower the NIGHT BEFORE SURGERY and the MORNING OF SURGERY with CHG.   2. If you chose to wash your hair, wash your hair first as usual with your normal shampoo.  3. After you shampoo, rinse your hair and body thoroughly to remove the shampoo.  4. Use CHG as you would any other liquid soap. You can apply CHG directly to the skin and wash gently with a scrungie or a clean washcloth.   5. Apply the CHG Soap to your body ONLY FROM THE NECK DOWN.  Do not use on open wounds or open sores. Avoid contact with your eyes, ears, mouth and genitals (private parts). Wash Face and genitals (private parts)  with your normal soap.  6. Wash thoroughly, paying special attention to the area where your surgery will be performed.  7. Thoroughly rinse your body with warm water from the neck down.  8. DO NOT shower/wash with your normal soap after using and rinsing off the CHG Soap.  9. Pat yourself dry with a CLEAN TOWEL.  10. Wear CLEAN PAJAMAS to bed the night before surgery, wear  comfortable clothes the morning of surgery  11. Place CLEAN SHEETS on your bed the night of your first shower and DO NOT SLEEP WITH PETS.  Day of Surgery:  Do not apply any deodorants/lotions.  Please wear clean clothes to the hospital/surgery center.   Remember to brush your teeth WITH YOUR REGULAR TOOTHPASTE.    Please read over the following fact sheets that you were given.

## 2020-03-15 DIAGNOSIS — Z992 Dependence on renal dialysis: Secondary | ICD-10-CM | POA: Diagnosis not present

## 2020-03-15 DIAGNOSIS — N2581 Secondary hyperparathyroidism of renal origin: Secondary | ICD-10-CM | POA: Diagnosis not present

## 2020-03-15 DIAGNOSIS — D509 Iron deficiency anemia, unspecified: Secondary | ICD-10-CM | POA: Diagnosis not present

## 2020-03-15 DIAGNOSIS — D631 Anemia in chronic kidney disease: Secondary | ICD-10-CM | POA: Diagnosis not present

## 2020-03-15 DIAGNOSIS — N186 End stage renal disease: Secondary | ICD-10-CM | POA: Diagnosis not present

## 2020-03-15 NOTE — Progress Notes (Signed)
Anesthesia Chart Review:  Case: [id number] Date/Time: 03/16/20 0715   Procedures:      RIGHT FEMORAL ENDARTERECTOMY (Right )     RIGHT FEMORAL-POPLITEAL ARTERY BYPASS GRAFT (Right )   Anesthesia type: General   Pre-op diagnosis: RIGHT FOOT WOUND   Location: [facility] ROOM 12 / [facility] OR   Surgeons: [name], MD      DISCUSSION: Patient is a 74 year old female scheduled for the above procedure. He has critical ischemia of her right foot with a right foot ulcer.    History includes never smoker, stage 1 left breast (s/p left breast lumpectomy 04/20/13, radiation), HTN, CAD (NSTEMI, s/p CABG 05/28/09: LIMA-LAD, SVG-DIAG, SVG-OM1-OM2), ischemic cardiomyopathy, chronic systolic and diastolic CHF, left BBB, exertional dyspnea, HLD, PAD (occluded left SFA; R 5th toe amputation 08/27/12, L 2nd toe amputation 07/08/13; left CFA endarterectomy, left FPBG with GSV graft 03/28/19, L 5th toe amputation 04/06/19), DM2 (with neuropathy), ESRD (right radiocephalic AVF 04/10/76, PD catheter 06/01/18-11/09/18), gouty arthritis, left shoulder dislocation (s/p closed reduction 05/27/19).   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 No symptoms of MR at her 10/28/19 visit with Dr. Johnsie Cancel, but he discussed updating her echo to re-evaluate EF and MR, although I don't see that an echo was ordered yet. He did order a carotid US for finding of left carotid bruit but results revealed no significant ICA stenosis. I have been in communication with Sande Rives, PA-C who clarified with Dr. Johnsie Cancel that echo did not need to be completed prior to this surgery if otherwise no acute changes.   03/13/20 presurgical COVID-19 test negative. Anesthesia team to evaluate on the day of surgery.    VS: BP (!) 156/74   Pulse 79   Temp 37.2 C   Resp 17   Ht 5\' 4"  (1.626 m)   Wt 69.9 kg   SpO2 100%   BMI 26.45 kg/m    PROVIDERS: Unk Pinto, MD is PCP. Last evaluation 02/13/20 with Liane Comber, NP. Jenkins Rouge, MD is cardiologist. Last visit 10/28/19.  Magrinat, Sarajane Jews, MD is Beatris Ship, MD is nephrologist   LABS: Preoperative labs noted. ISTAT for day of surgery given ESRD. A1c 5.4% on 02/13/20. (all labs ordered are listed, but only abnormal results are displayed)  Labs Reviewed  CBC - Abnormal; Notable for the following components:      Result Value   RBC 3.74 (*)    RDW 16.5 (*)    All other components within normal limits  COMPREHENSIVE METABOLIC PANEL - Abnormal; Notable for the following components:   Chloride 93 (*)    BUN 37 (*)    Creatinine, Ser 3.71 (*)    Alkaline Phosphatase 182 (*)    GFR, Estimated 12 (*)    All other components within normal limits  SURGICAL PCR SCREEN  APTT  PROTIME-INR  GLUCOSE, CAPILLARY  TYPE AND SCREEN    IMAGES: CXR 03/23/19: IMPRESSION: No active cardiopulmonary disease. Aortic atherosclerosis.   EKG: 02/24/20: Normal sinus rhythm Left axis deviation Non-specific intra-ventricular conduction block Minimal voltage criteria for LVH, may be normal variant ( Cornell product ) Abnormal ECG No significant change since last tracing Confirmed by Cristopher Peru [phone]) on 02/24/2020 8:52:11 PM - Known LBBB history   CV: Aortogram/RLE angiography 02/24/20: Subtotal right common femoral occlusive plaque Right superficial femoral artery occluded throughout its length Reconstitution of small island of behind-knee right popliteal artery Right anterior tibial artery reconstitutes near its origin with near  normal flow to the foot.   Carotid US 11/23/19: Summary:  - Right Carotid: Velocities in the right ICA are consistent with a 1-39%  stenosis.  - Left Carotid: Velocities in the left ICA are consistent with a 1-39%  stenosis. The ECA appears >50% stenosed.  - Vertebrals: Bilateral vertebral arteries demonstrate antegrade flow.  - Subclavians: Right subclavian artery flow was disturbed. Normal flow hemodynamics were  seen in the left subclavian artery.    Echo 12/01/17: Study Conclusions  - Left ventricle: Hypertrophy was noted. Systolic function was  normal. The estimated ejection fraction was in the range of 50%  to 55%. Wall motion was normal; there were no regional wall  motion abnormalities.  - Aortic valve: No evidence of vegetation.  - Mitral valve: Mildly calcified annulus. Mildly thickened leaflets  . There was moderate regurgitation.  - Left atrium: The atrium was mildly dilated. No evidence of  thrombus in the atrial cavity or appendage.  - Atrial septum: No defect or patent foramen ovale was identified.  - Tricuspid valve: No evidence of vegetation. There was moderate  regurgitation.  - Pulmonic valve: No evidence of vegetation.  Impressions:  - Normal LV function; small, oscillating density attached to  subvalvular apparatus (possible etiology fibroelastoma; cannot  exclude vegetation); mild LAE; moderate MR; moderate TR.    Nuclear stress test 05/21/17:  Nuclear stress EF: 44%.  There was no ST segment deviation noted during stress.  The study is normal.  This is a low risk study.  The left ventricular ejection fraction is mildly decreased (45-54%).   Normal pharmacologic nuclear stress test with no evidence for prior infarct or ischemia.  Mildly decreased LVEF calculated at 44%.   Last cardiac cath was 05/24/2009 prior to CABG.   Past Medical History:  Diagnosis Date  . Allergy   . Anterior dislocation of left shoulder 05/27/2019  . Arthritis    "maybe in my lower back" (08/27/2012)  . 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 Chronic combined systolic and diastolic CHF (congestive heart failure) ([place])   . CKD (chronic kidney disease), stage IV ([place])   . Coronary artery disease    a. NSTEMI in 05/2009 s/p CABG (LIMA-LAD, SVG-diagonal, SVG-OM1/OM 2).   Marland Kitchen Dyspnea    with exertion  . Family history of  anesthesia complication    Mom has a hard time to wake up  . Foot ulcer ([place])    "I've had them on both feet" (08/27/2012)  . Gouty arthritis    "right index finger" (08/27/2012)  . History of radiation therapy 06/09/13-07/06/13   left breast 50Gy  . Hyperlipidemia   . Hypertension   . Infection    right second toe  . Ischemic cardiomyopathy    a. 2011: EF 40-45%. b. EF 55-60% in 04/2017 but shortly after as inpatient was 45-50%.  Marland Kitchen LBBB (left bundle branch block)   . Mitral regurgitation   . Neuropathy    Hx; of B/L feet  . NSTEMI (non-ST elevated myocardial infarction) ([place]) 05/23/2009  . Osteomyelitis of foot ([place])   . PAD (peripheral artery disease) ([facility])    a. LE PAD (patient previously elected hold off L fem-pop), prev followed by Dr. Bridgett Larsson  . PONV (postoperative nausea and vomiting)   . Sinus headache    "occasionally" (08/27/2012)  . Type II diabetes mellitus ([place])   . Vitamin D deficiency     Past Surgical History:  Procedure Laterality Date  .  ABDOMINAL AORTOGRAM W/LOWER EXTREMITY Bilateral 03/25/2019   Procedure: ABDOMINAL AORTOGRAM W/LOWER EXTREMITY;  Surgeon: Marty Heck, MD;  Location: [facility];  Service: Cardiovascular;  Laterality: Bilateral;  . ABDOMINAL AORTOGRAM W/LOWER EXTREMITY Bilateral 02/24/2020   Procedure: ABDOMINAL AORTOGRAM W/LOWER EXTREMITY;  Surgeon: Cherre Robins, MD;  Location: [facility];  Service: Cardiovascular;  Laterality: Bilateral;  . AMPUTATION Right 08/27/2012   Procedure: AMPUTATION RAY;  Surgeon: Newt Minion, MD;  Location: [facility];  Service: Orthopedics;  Laterality: Right;  Right Foot 5th Ray Amputation  . AMPUTATION Left 07/08/2013   Procedure: AMPUTATION RAY;  Surgeon: Newt Minion, MD;  Location: [facility];  Service: Orthopedics;  Laterality: Left;  Left Foot 2nd Ray Amputation  . AMPUTATION Left 04/06/2019   Procedure: LEFT 5TH RAY AMPUTATION;  Surgeon: Newt Minion, MD;  Location: [place];  Service: Orthopedics;   Laterality: Left;  . AMPUTATION RAY Right 08/27/2012   5th ray/notes 08/27/2012  . AMPUTATION TOE Right 03/06/2017   Procedure: AMPUTATION TOE, INTERPHANGEAL 2ND RIGHT;  Surgeon: [name], DPM;  Location: [facility];  Service: Podiatry;  Laterality: Right;  . AV FISTULA PLACEMENT Right 11/11/2017   Procedure: RIGHT RADIOCEPHALIC  ARTERIOVENOUS FISTULA;  Surgeon: [name], MD;  Location: [facility];  Service: Vascular;  Laterality: Right;  . BREAST LUMPECTOMY  04/20/2013   with biopsy      DR WAKEFIELD  . BREAST LUMPECTOMY WITH NEEDLE LOCALIZATION AND AXILLARY SENTINEL LYMPH NODE BX Left 04/20/2013   Procedure: LEFT BREAST WIRE GUIDED LUMPECTOMY AND AXILLARY SENTINEL LYMPH NODE BX;  Surgeon: Rolm Bookbinder, MD;  Location: [facility];  Service: General;  Laterality: Left;  . CARDIAC CATHETERIZATION  05/24/2009   Archie Endo 05/24/2009 (08/27/2012)  . CATARACT EXTRACTION W/ INTRAOCULAR LENS  IMPLANT, BILATERAL  2000's  . COLONOSCOPY W/ BIOPSIES AND POLYPECTOMY  2010  . CORONARY ARTERY BYPASS GRAFT  2011   "CABG X4" (08/27/2012)  . DENTAL SURGERY  04/30/11   "1 implant" (08/27/2012)  . DILATION AND CURETTAGE OF UTERUS    . ENDARTERECTOMY FEMORAL Left 03/28/2019   Procedure: ENDARTERECTOMY FEMORAL;  Surgeon: [name], MD;  Location: [facility];  Service: Vascular;  Laterality: Left;  . EYE SURGERY    . FEMORAL-POPLITEAL BYPASS GRAFT Left 03/28/2019   Procedure: BYPASS GRAFT FEMORAL-POPLITEAL ARTERY;  Surgeon: [name], MD;  Location: [facility];  Service: Vascular;  Laterality: Left;  . FINGER SURGERY Right    "index finger; turned out to be gout" (08/27/2012)  . IR FLUORO GUIDE CV LINE RIGHT  11/30/2017  . PATCH ANGIOPLASTY Left 03/28/2019   Procedure: Patch Angioplasty;  Surgeon: [name], MD;  Location: [facility];  Service: Vascular;  Laterality: Left;  . SHOULDER CLOSED REDUCTION Left 05/27/2019   Procedure: CLOSED REDUCTION LEFT SHOULDER;  Surgeon: Mcarthur Rossetti, MD;  Location: [facility];   Service: Orthopedics;  Laterality: Left;  . TEE WITHOUT CARDIOVERSION N/A 12/01/2017   Procedure: TRANSESOPHAGEAL ECHOCARDIOGRAM (TEE);  Surgeon: Lelon Perla, MD;  Location[facility];  Service: Cardiovascular;  Laterality: N/A;    MEDICATIONS: . allopurinol (ZYLOPRIM) 100 MG tablet  . ARTIFICIAL TEAR SOLUTION OP  . aspirin EC 81 MG tablet  . aspirin-acetaminophen-caffeine (EXCEDRIN EXTRA STRENGTH) 250-250-65 MG tablet  . atorvastatin (LIPITOR) 10 MG tablet  . b complex-vitamin c-folic acid (NEPHRO-VITE) 0.8 MG TABS tablet  . cinacalcet (SENSIPAR) 30 MG tablet  . cyclobenzaprine (FLEXERIL) 5 MG tablet  . Darbepoetin Alfa (ARANESP)  100 MCG/0.5ML SOSY injection  . docusate sodium (COLACE) 100 MG capsule  . doxercalciferol (HECTOROL) 4 MCG/2ML injection  . ferric gluconate 125 mg in sodium chloride 0.9 % 100 mL  . glucose blood (PRODIGY NO CODING BLOOD GLUC) test strip  . Homeopathic Products ([place])  . hydrALAZINE (APRESOLINE) 25 MG tablet  . HYDROcodone-acetaminophen (NORCO/VICODIN) 5-325 MG tablet  . insulin NPH-regular Human (70-30) 100 UNIT/ML injection  . Insulin Pen Needle (BD PEN NEEDLE NANO U/F) 32G X 4 MM MISC  . Insulin Syringe-Needle U-100 (BD INSULIN SYRINGE U/F) 31G X 5/16" 1 ML MISC  . isosorbide dinitrate (ISORDIL) 10 MG tablet  . METAMUCIL FIBER PO  . Methoxy PEG-Epoetin Beta (MIRCERA IJ)  . Multiple Vitamins-Minerals (PRESERVISION AREDS 2 PO)  . pregabalin (LYRICA) 100 MG capsule  . Probiotic Product ([place]) CAPS  . SANTYL ointment  . sevelamer carbonate (RENVELA) 800 MG tablet  . traZODone (DESYREL) 50 MG tablet   No current facility-administered medications for this encounter.    Myra Gianotti, PA-C Surgical Short Stay/Anesthesiology [facility] Phone [phone] [facility] Phone [phone] 03/15/2020 3:03 PM

## 2020-03-15 NOTE — Anesthesia Preprocedure Evaluation (Addendum)
Anesthesia Evaluation  Patient identified by MRN, date of birth, ID band Patient awake    Reviewed: Allergy & Precautions, NPO status , Patient's Chart, lab work & pertinent test results  History of Anesthesia Complications (+) PONV  Airway Mallampati: II  TM Distance: >3 FB Neck ROM: Full    Dental  (+) Dental Advisory Given   Pulmonary shortness of breath, asthma ,    breath sounds clear to auscultation       Cardiovascular hypertension, Pt. on medications + CAD, + Past MI, + CABG, + Peripheral Vascular Disease and +CHF  + dysrhythmias  Rhythm:Regular Rate:Normal  Echo 12/01/17: Study Conclusions  - Left ventricle: Hypertrophy was noted. Systolic function was  normal. The estimated ejection fraction was in the range of 50%  to 55%. Wall motion was normal; there were no regional wall  motion abnormalities.  - Aortic valve: No evidence of vegetation.  - Mitral valve: Mildly calcified annulus. Mildly thickened leaflets  . There was moderate regurgitation.  - Left atrium: The atrium was mildly dilated. No evidence of  thrombus in the atrial cavity or appendage.  - Atrial septum: No defect or patent foramen ovale was identified.  - Tricuspid valve: No evidence of vegetation. There was moderate  regurgitation.  - Pulmonic valve: No evidence of vegetation   Neuro/Psych  Headaches,    GI/Hepatic negative GI ROS, Neg liver ROS,   Endo/Other  diabetes, Type 2, Insulin Dependent  Renal/GU ESRF and DialysisRenal disease     Musculoskeletal  (+) Arthritis ,   Abdominal   Peds  Hematology  (+) anemia ,   Anesthesia Other Findings   Reproductive/Obstetrics                             Lab Results  Component Value Date   WBC 7.8 03/14/2020   HGB 12.5 03/14/2020   HCT 36.6 03/14/2020   MCV 97.9 03/14/2020   PLT 151 03/14/2020   Lab Results  Component Value Date   CREATININE 3.71  (H) 03/14/2020   BUN 37 (H) 03/14/2020   NA 136 03/14/2020   K 3.8 03/14/2020   CL 93 (L) 03/14/2020   CO2 30 03/14/2020    Anesthesia Physical Anesthesia Plan  ASA: IV  Anesthesia Plan: General   Post-op Pain Management:    Induction: Intravenous  PONV Risk Score and Plan: 3 and 4 or greater and Dexamethasone, Ondansetron, Treatment may vary due to age or medical condition and Midazolam  Airway Management Planned: Oral ETT  Additional Equipment:   Intra-op Plan:   Post-operative Plan: Extubation in OR  Informed Consent: I have reviewed the patients History and Physical, chart, labs and discussed the procedure including the risks, benefits and alternatives for the proposed anesthesia with the patient or authorized representative who has indicated his/her understanding and acceptance.     Dental advisory given  Plan Discussed with: CRNA  Anesthesia Plan Comments: ( )      Anesthesia Quick Evaluation

## 2020-03-16 ENCOUNTER — Encounter (HOSPITAL_COMMUNITY): Payer: Self-pay | Admitting: Vascular Surgery

## 2020-03-16 ENCOUNTER — Encounter (HOSPITAL_COMMUNITY)
Admission: RE | Disposition: A | Discharge status: Skilled Nursing Facility | Payer: Self-pay | Source: Home / Self Care | Attending: Vascular Surgery

## 2020-03-16 ENCOUNTER — Inpatient Hospital Stay (HOSPITAL_COMMUNITY): Payer: Medicare Other | Admitting: Physician Assistant

## 2020-03-16 ENCOUNTER — Inpatient Hospital Stay (HOSPITAL_COMMUNITY): Payer: Medicare Other | Admitting: Certified Registered Nurse Anesthetist

## 2020-03-16 ENCOUNTER — Inpatient Hospital Stay (HOSPITAL_COMMUNITY)
Admission: RE | Admit: 2020-03-16 | Discharge: 2020-03-27 | DRG: 270 | Disposition: A | Discharge status: Skilled Nursing Facility | Payer: Medicare Other | Attending: Vascular Surgery | Admitting: Vascular Surgery

## 2020-03-16 ENCOUNTER — Other Ambulatory Visit: Payer: Self-pay

## 2020-03-16 DIAGNOSIS — N2581 Secondary hyperparathyroidism of renal origin: Secondary | ICD-10-CM | POA: Diagnosis present

## 2020-03-16 DIAGNOSIS — I2510 Atherosclerotic heart disease of native coronary artery without angina pectoris: Secondary | ICD-10-CM | POA: Diagnosis present

## 2020-03-16 DIAGNOSIS — J45909 Unspecified asthma, uncomplicated: Secondary | ICD-10-CM | POA: Diagnosis present

## 2020-03-16 DIAGNOSIS — Z20822 Contact with and (suspected) exposure to covid-19: Secondary | ICD-10-CM | POA: Diagnosis not present

## 2020-03-16 DIAGNOSIS — E1142 Type 2 diabetes mellitus with diabetic polyneuropathy: Secondary | ICD-10-CM | POA: Diagnosis not present

## 2020-03-16 DIAGNOSIS — E7849 Other hyperlipidemia: Secondary | ICD-10-CM | POA: Diagnosis present

## 2020-03-16 DIAGNOSIS — I70229 Atherosclerosis of native arteries of extremities with rest pain, unspecified extremity: Secondary | ICD-10-CM | POA: Diagnosis present

## 2020-03-16 DIAGNOSIS — Z7982 Long term (current) use of aspirin: Secondary | ICD-10-CM

## 2020-03-16 DIAGNOSIS — N186 End stage renal disease: Secondary | ICD-10-CM | POA: Diagnosis present

## 2020-03-16 DIAGNOSIS — Z888 Allergy status to other drugs, medicaments and biological substances status: Secondary | ICD-10-CM

## 2020-03-16 DIAGNOSIS — I998 Other disorder of circulatory system: Secondary | ICD-10-CM | POA: Diagnosis not present

## 2020-03-16 DIAGNOSIS — Z923 Personal history of irradiation: Secondary | ICD-10-CM

## 2020-03-16 DIAGNOSIS — Z853 Personal history of malignant neoplasm of breast: Secondary | ICD-10-CM | POA: Diagnosis not present

## 2020-03-16 DIAGNOSIS — E1169 Type 2 diabetes mellitus with other specified complication: Secondary | ICD-10-CM | POA: Diagnosis present

## 2020-03-16 DIAGNOSIS — E11621 Type 2 diabetes mellitus with foot ulcer: Secondary | ICD-10-CM | POA: Diagnosis not present

## 2020-03-16 DIAGNOSIS — E559 Vitamin D deficiency, unspecified: Secondary | ICD-10-CM | POA: Diagnosis not present

## 2020-03-16 DIAGNOSIS — E1140 Type 2 diabetes mellitus with diabetic neuropathy, unspecified: Secondary | ICD-10-CM | POA: Diagnosis present

## 2020-03-16 DIAGNOSIS — R2681 Unsteadiness on feet: Secondary | ICD-10-CM | POA: Diagnosis not present

## 2020-03-16 DIAGNOSIS — I5042 Chronic combined systolic (congestive) and diastolic (congestive) heart failure: Secondary | ICD-10-CM | POA: Diagnosis present

## 2020-03-16 DIAGNOSIS — I70235 Atherosclerosis of native arteries of right leg with ulceration of other part of foot: Secondary | ICD-10-CM | POA: Diagnosis not present

## 2020-03-16 DIAGNOSIS — M2550 Pain in unspecified joint: Secondary | ICD-10-CM | POA: Diagnosis not present

## 2020-03-16 DIAGNOSIS — Z89422 Acquired absence of other left toe(s): Secondary | ICD-10-CM | POA: Diagnosis not present

## 2020-03-16 DIAGNOSIS — I70234 Atherosclerosis of native arteries of right leg with ulceration of heel and midfoot: Secondary | ICD-10-CM | POA: Diagnosis not present

## 2020-03-16 DIAGNOSIS — E11319 Type 2 diabetes mellitus with unspecified diabetic retinopathy without macular edema: Secondary | ICD-10-CM | POA: Diagnosis not present

## 2020-03-16 DIAGNOSIS — R41841 Cognitive communication deficit: Secondary | ICD-10-CM | POA: Diagnosis not present

## 2020-03-16 DIAGNOSIS — I132 Hypertensive heart and chronic kidney disease with heart failure and with stage 5 chronic kidney disease, or end stage renal disease: Secondary | ICD-10-CM | POA: Diagnosis present

## 2020-03-16 DIAGNOSIS — I447 Left bundle-branch block, unspecified: Secondary | ICD-10-CM | POA: Diagnosis not present

## 2020-03-16 DIAGNOSIS — E1122 Type 2 diabetes mellitus with diabetic chronic kidney disease: Secondary | ICD-10-CM | POA: Diagnosis present

## 2020-03-16 DIAGNOSIS — I120 Hypertensive chronic kidney disease with stage 5 chronic kidney disease or end stage renal disease: Secondary | ICD-10-CM | POA: Diagnosis not present

## 2020-03-16 DIAGNOSIS — Z48812 Encounter for surgical aftercare following surgery on the circulatory system: Secondary | ICD-10-CM | POA: Diagnosis not present

## 2020-03-16 DIAGNOSIS — M109 Gout, unspecified: Secondary | ICD-10-CM | POA: Diagnosis present

## 2020-03-16 DIAGNOSIS — Z79899 Other long term (current) drug therapy: Secondary | ICD-10-CM

## 2020-03-16 DIAGNOSIS — E1129 Type 2 diabetes mellitus with other diabetic kidney complication: Secondary | ICD-10-CM | POA: Diagnosis not present

## 2020-03-16 DIAGNOSIS — Z20818 Contact with and (suspected) exposure to other bacterial communicable diseases: Secondary | ICD-10-CM | POA: Diagnosis not present

## 2020-03-16 DIAGNOSIS — I499 Cardiac arrhythmia, unspecified: Secondary | ICD-10-CM | POA: Diagnosis not present

## 2020-03-16 DIAGNOSIS — Z95820 Peripheral vascular angioplasty status with implants and grafts: Secondary | ICD-10-CM

## 2020-03-16 DIAGNOSIS — I70202 Unspecified atherosclerosis of native arteries of extremities, left leg: Secondary | ICD-10-CM | POA: Diagnosis present

## 2020-03-16 DIAGNOSIS — E1151 Type 2 diabetes mellitus with diabetic peripheral angiopathy without gangrene: Principal | ICD-10-CM | POA: Diagnosis present

## 2020-03-16 DIAGNOSIS — I255 Ischemic cardiomyopathy: Secondary | ICD-10-CM | POA: Diagnosis present

## 2020-03-16 DIAGNOSIS — L97519 Non-pressure chronic ulcer of other part of right foot with unspecified severity: Secondary | ICD-10-CM | POA: Diagnosis present

## 2020-03-16 DIAGNOSIS — Z7401 Bed confinement status: Secondary | ICD-10-CM | POA: Diagnosis not present

## 2020-03-16 DIAGNOSIS — D631 Anemia in chronic kidney disease: Secondary | ICD-10-CM | POA: Diagnosis present

## 2020-03-16 DIAGNOSIS — Z91041 Radiographic dye allergy status: Secondary | ICD-10-CM

## 2020-03-16 DIAGNOSIS — Z951 Presence of aortocoronary bypass graft: Secondary | ICD-10-CM

## 2020-03-16 DIAGNOSIS — I739 Peripheral vascular disease, unspecified: Secondary | ICD-10-CM | POA: Diagnosis not present

## 2020-03-16 DIAGNOSIS — I70223 Atherosclerosis of native arteries of extremities with rest pain, bilateral legs: Secondary | ICD-10-CM | POA: Diagnosis not present

## 2020-03-16 DIAGNOSIS — Z992 Dependence on renal dialysis: Secondary | ICD-10-CM | POA: Diagnosis not present

## 2020-03-16 DIAGNOSIS — Z91040 Latex allergy status: Secondary | ICD-10-CM

## 2020-03-16 DIAGNOSIS — E441 Mild protein-calorie malnutrition: Secondary | ICD-10-CM | POA: Diagnosis not present

## 2020-03-16 DIAGNOSIS — Z9889 Other specified postprocedural states: Secondary | ICD-10-CM | POA: Diagnosis not present

## 2020-03-16 DIAGNOSIS — I252 Old myocardial infarction: Secondary | ICD-10-CM

## 2020-03-16 DIAGNOSIS — L0390 Cellulitis, unspecified: Secondary | ICD-10-CM | POA: Diagnosis not present

## 2020-03-16 DIAGNOSIS — I5040 Unspecified combined systolic (congestive) and diastolic (congestive) heart failure: Secondary | ICD-10-CM | POA: Diagnosis not present

## 2020-03-16 DIAGNOSIS — M6281 Muscle weakness (generalized): Secondary | ICD-10-CM | POA: Diagnosis not present

## 2020-03-16 DIAGNOSIS — J069 Acute upper respiratory infection, unspecified: Secondary | ICD-10-CM | POA: Diagnosis not present

## 2020-03-16 DIAGNOSIS — Z794 Long term (current) use of insulin: Secondary | ICD-10-CM

## 2020-03-16 HISTORY — PX: ENDARTERECTOMY FEMORAL: SHX5804

## 2020-03-16 HISTORY — PX: FEMORAL-POPLITEAL BYPASS GRAFT: SHX937

## 2020-03-16 LAB — CREATININE, SERUM
Creatinine, Ser: 3.52 mg/dL — ABNORMAL HIGH (ref 0.44–1.00)
GFR, Estimated: 13 mL/min — ABNORMAL LOW (ref >60)

## 2020-03-16 LAB — POCT I-STAT, CHEM 8
BUN: 30 mg/dL — ABNORMAL HIGH (ref 8–23)
BUN: 26 mg/dL — ABNORMAL HIGH (ref 8–23)
Calcium, Ion: 1.05 mmol/L — ABNORMAL LOW (ref 1.15–1.40)
Calcium, Ion: 1.01 mmol/L — ABNORMAL LOW (ref 1.15–1.40)
Chloride: 94 mmol/L — ABNORMAL LOW (ref 98–111)
Chloride: 100 mmol/L (ref 98–111)
Creatinine, Ser: 3 mg/dL — ABNORMAL HIGH (ref 0.44–1.00)
Creatinine, Ser: 3 mg/dL — ABNORMAL HIGH (ref 0.44–1.00)
Glucose, Bld: 94 mg/dL (ref 70–99)
Glucose, Bld: 112 mg/dL — ABNORMAL HIGH (ref 70–99)
HCT: 42 % (ref 36.0–46.0)
HCT: 21 % — ABNORMAL LOW (ref 36.0–46.0)
Hemoglobin: 14.3 g/dL (ref 12.0–15.0)
Hemoglobin: 7.1 g/dL — ABNORMAL LOW (ref 12.0–15.0)
Potassium: 3.3 mmol/L — ABNORMAL LOW (ref 3.5–5.1)
Potassium: 3.4 mmol/L — ABNORMAL LOW (ref 3.5–5.1)
Sodium: 138 mmol/L (ref 135–145)
Sodium: 139 mmol/L (ref 135–145)
TCO2: 31 mmol/L (ref 22–32)
TCO2: 25 mmol/L (ref 22–32)

## 2020-03-16 LAB — GLUCOSE, CAPILLARY
Glucose-Capillary: 93 mg/dL (ref 70–99)
Glucose-Capillary: 108 mg/dL — ABNORMAL HIGH (ref 70–99)
Glucose-Capillary: 205 mg/dL — ABNORMAL HIGH (ref 70–99)

## 2020-03-16 LAB — CBC
HCT: 24.1 % — ABNORMAL LOW (ref 36.0–46.0)
Hemoglobin: 8.2 g/dL — ABNORMAL LOW (ref 12.0–15.0)
MCH: 32.7 pg (ref 26.0–34.0)
MCHC: 34 g/dL (ref 30.0–36.0)
MCV: 96 fL (ref 80.0–100.0)
Platelets: 113 10*3/uL — ABNORMAL LOW (ref 150–400)
RBC: 2.51 MIL/uL — ABNORMAL LOW (ref 3.87–5.11)
RDW: 17 % — ABNORMAL HIGH (ref 11.5–15.5)
WBC: 7.7 10*3/uL (ref 4.0–10.5)
nRBC: 0 % (ref 0.0–0.2)

## 2020-03-16 LAB — PREPARE RBC (CROSSMATCH)

## 2020-03-16 SURGERY — ENDARTERECTOMY, FEMORAL
Anesthesia: General | Site: Leg Upper | Laterality: Right

## 2020-03-16 MED ORDER — PROTAMINE SULFATE 10 MG/ML IV SOLN
INTRAVENOUS | Status: AC
  Filled 2020-03-16: qty 5

## 2020-03-16 MED ORDER — PROTAMINE SULFATE 10 MG/ML IV SOLN
INTRAVENOUS | Status: DC | PRN
  Administered 2020-03-16 (×2): 50 mg via INTRAVENOUS

## 2020-03-16 MED ORDER — SODIUM CHLORIDE 0.9 % IV SOLN
INTRAVENOUS | Status: DC | PRN
  Administered 2020-03-16: 500 mL

## 2020-03-16 MED ORDER — ACETAMINOPHEN 325 MG PO TABS
325.0000 mg | ORAL_TABLET | ORAL | Status: DC | PRN
  Administered 2020-03-18: 650 mg via ORAL
  Filled 2020-03-16: qty 2

## 2020-03-16 MED ORDER — PHENYLEPHRINE HCL-NACL 10-0.9 MG/250ML-% IV SOLN
INTRAVENOUS | Status: DC | PRN
  Administered 2020-03-16: 25 ug/min via INTRAVENOUS

## 2020-03-16 MED ORDER — ALLOPURINOL 100 MG PO TABS
100.0000 mg | ORAL_TABLET | Freq: Two times a day (BID) | ORAL | Status: DC
  Administered 2020-03-17 – 2020-03-27 (×21): 100 mg via ORAL
  Filled 2020-03-16 (×21): qty 1

## 2020-03-16 MED ORDER — MORPHINE SULFATE (PF) 2 MG/ML IV SOLN
2.0000 mg | INTRAVENOUS | Status: DC | PRN
Start: 2020-03-16 — End: 2020-03-25
  Administered 2020-03-18 – 2020-03-22 (×4): 2 mg via INTRAVENOUS
  Filled 2020-03-16 (×3): qty 1

## 2020-03-16 MED ORDER — ONDANSETRON HCL 4 MG/2ML IJ SOLN
INTRAMUSCULAR | Status: AC
  Filled 2020-03-16: qty 2

## 2020-03-16 MED ORDER — 0.9 % SODIUM CHLORIDE (POUR BTL) OPTIME
TOPICAL | Status: DC | PRN
  Administered 2020-03-16: 2000 mL

## 2020-03-16 MED ORDER — CHLORHEXIDINE GLUCONATE 0.12 % MT SOLN: 15.0000 mL | Freq: Once | OROMUCOSAL | Status: AC

## 2020-03-16 MED ORDER — ISOSORBIDE DINITRATE 10 MG PO TABS
10.0000 mg | ORAL_TABLET | Freq: Three times a day (TID) | ORAL | Status: DC
  Administered 2020-03-16 – 2020-03-27 (×29): 10 mg via ORAL
  Filled 2020-03-16 (×29): qty 1

## 2020-03-16 MED ORDER — SODIUM CHLORIDE 0.9 % IV SOLN
500.0000 mL | Freq: Once | INTRAVENOUS | Status: AC | PRN
  Administered 2020-03-17: 03:00:00 500 mL via INTRAVENOUS

## 2020-03-16 MED ORDER — DEXAMETHASONE SODIUM PHOSPHATE 10 MG/ML IJ SOLN
INTRAMUSCULAR | Status: AC
  Filled 2020-03-16: qty 1

## 2020-03-16 MED ORDER — PROPOFOL 10 MG/ML IV BOLUS
INTRAVENOUS | Status: AC
  Filled 2020-03-16: qty 40

## 2020-03-16 MED ORDER — CHLORHEXIDINE GLUCONATE CLOTH 2 % EX PADS: 6.0000 | MEDICATED_PAD | Freq: Once | CUTANEOUS | Status: DC

## 2020-03-16 MED ORDER — ROCURONIUM BROMIDE 10 MG/ML (PF) SYRINGE
PREFILLED_SYRINGE | INTRAVENOUS | Status: DC | PRN
  Administered 2020-03-16: 10 mg via INTRAVENOUS
  Administered 2020-03-16: 40 mg via INTRAVENOUS

## 2020-03-16 MED ORDER — SODIUM CHLORIDE 0.9% IV SOLUTION: Freq: Once | INTRAVENOUS | Status: DC

## 2020-03-16 MED ORDER — CEFAZOLIN SODIUM-DEXTROSE 2-4 GM/100ML-% IV SOLN
2.0000 g | INTRAVENOUS | Status: AC
  Administered 2020-03-16: 2 g via INTRAVENOUS

## 2020-03-16 MED ORDER — DOXERCALCIFEROL 4 MCG/2ML IV SOLN
3.0000 ug | INTRAVENOUS | Status: DC
  Administered 2020-03-22: 3 ug via INTRAVENOUS
  Filled 2020-03-16 (×4): qty 2

## 2020-03-16 MED ORDER — DARBEPOETIN ALFA 60 MCG/0.3ML IJ SOSY
60.0000 ug | PREFILLED_SYRINGE | INTRAMUSCULAR | Status: DC
  Filled 2020-03-16 (×2): qty 0.3

## 2020-03-16 MED ORDER — DEXAMETHASONE SODIUM PHOSPHATE 10 MG/ML IJ SOLN
INTRAMUSCULAR | Status: DC | PRN
  Administered 2020-03-16: 4 mg via INTRAVENOUS

## 2020-03-16 MED ORDER — METOPROLOL TARTRATE 5 MG/5ML IV SOLN: 2.0000 mg | INTRAVENOUS | Status: DC | PRN

## 2020-03-16 MED ORDER — HYDRALAZINE HCL 25 MG PO TABS
75.0000 mg | ORAL_TABLET | Freq: Three times a day (TID) | ORAL | Status: DC
  Administered 2020-03-19: 09:00:00 75 mg via ORAL
  Filled 2020-03-16 (×6): qty 1

## 2020-03-16 MED ORDER — SODIUM CHLORIDE 0.9 % IV SOLN: INTRAVENOUS | Status: DC

## 2020-03-16 MED ORDER — ORAL CARE MOUTH RINSE: 15.0000 mL | Freq: Once | OROMUCOSAL | Status: AC

## 2020-03-16 MED ORDER — ALBUMIN HUMAN 5 % IV SOLN
INTRAVENOUS | Status: AC
  Filled 2020-03-16: qty 500

## 2020-03-16 MED ORDER — PHENOL 1.4 % MT LIQD: 1.0000 | OROMUCOSAL | Status: DC | PRN

## 2020-03-16 MED ORDER — INSULIN ASPART 100 UNIT/ML ~~LOC~~ SOLN
0.0000 [IU] | Freq: Three times a day (TID) | SUBCUTANEOUS | Status: DC
  Administered 2020-03-17 (×2): 3 [IU] via SUBCUTANEOUS
  Administered 2020-03-17 – 2020-03-18 (×3): 2 [IU] via SUBCUTANEOUS
  Administered 2020-03-19: 17:00:00 3 [IU] via SUBCUTANEOUS
  Administered 2020-03-19 – 2020-03-21 (×5): 2 [IU] via SUBCUTANEOUS
  Administered 2020-03-21: 17:00:00 1 [IU] via SUBCUTANEOUS
  Administered 2020-03-22 (×3): 2 [IU] via SUBCUTANEOUS
  Administered 2020-03-23: 16:00:00 3 [IU] via SUBCUTANEOUS
  Administered 2020-03-23 – 2020-03-26 (×7): 2 [IU] via SUBCUTANEOUS

## 2020-03-16 MED ORDER — ACETAMINOPHEN 500 MG PO TABS: 1000.0000 mg | ORAL_TABLET | Freq: Once | ORAL | Status: AC

## 2020-03-16 MED ORDER — SEVELAMER CARBONATE 800 MG PO TABS
2400.0000 mg | ORAL_TABLET | Freq: Three times a day (TID) | ORAL | Status: DC
  Administered 2020-03-17 – 2020-03-24 (×16): 2400 mg via ORAL
  Administered 2020-03-24: 07:00:00 1600 mg via ORAL
  Administered 2020-03-25 – 2020-03-26 (×6): 2400 mg via ORAL
  Filled 2020-03-16 (×24): qty 3

## 2020-03-16 MED ORDER — MAGNESIUM SULFATE 2 GM/50ML IV SOLN: 2.0000 g | Freq: Every day | INTRAVENOUS | Status: DC | PRN

## 2020-03-16 MED ORDER — POTASSIUM CHLORIDE CRYS ER 20 MEQ PO TBCR: 20.0000 meq | EXTENDED_RELEASE_TABLET | Freq: Every day | ORAL | Status: DC | PRN

## 2020-03-16 MED ORDER — ACETAMINOPHEN 500 MG PO TABS
ORAL_TABLET | ORAL | Status: AC
  Administered 2020-03-16: 1000 mg via ORAL
  Filled 2020-03-16: qty 2

## 2020-03-16 MED ORDER — CEFAZOLIN SODIUM-DEXTROSE 2-4 GM/100ML-% IV SOLN
INTRAVENOUS | Status: AC
  Filled 2020-03-16: qty 100

## 2020-03-16 MED ORDER — PHENYLEPHRINE HCL-NACL 10-0.9 MG/250ML-% IV SOLN
0.0000 ug/min | INTRAVENOUS | Status: DC
  Administered 2020-03-16: 20 ug/min via INTRAVENOUS

## 2020-03-16 MED ORDER — ALBUMIN HUMAN 5 % IV SOLN: INTRAVENOUS | Status: DC | PRN

## 2020-03-16 MED ORDER — HYDRALAZINE HCL 20 MG/ML IJ SOLN: 5.0000 mg | INTRAMUSCULAR | Status: DC | PRN

## 2020-03-16 MED ORDER — FENTANYL CITRATE (PF) 250 MCG/5ML IJ SOLN
INTRAMUSCULAR | Status: DC | PRN
  Administered 2020-03-16 (×3): 50 ug via INTRAVENOUS
  Administered 2020-03-16: 25 ug via INTRAVENOUS

## 2020-03-16 MED ORDER — TRAZODONE HCL 50 MG PO TABS
25.0000 mg | ORAL_TABLET | Freq: Every day | ORAL | Status: DC
  Administered 2020-03-16 – 2020-03-26 (×7): 25 mg via ORAL
  Filled 2020-03-16 (×8): qty 1

## 2020-03-16 MED ORDER — AMISULPRIDE (ANTIEMETIC) 5 MG/2ML IV SOLN: 10.0000 mg | Freq: Once | INTRAVENOUS | Status: DC | PRN

## 2020-03-16 MED ORDER — HEPARIN SODIUM (PORCINE) 5000 UNIT/ML IJ SOLN
5000.0000 [IU] | Freq: Three times a day (TID) | INTRAMUSCULAR | Status: DC
  Administered 2020-03-17 – 2020-03-27 (×29): 5000 [IU] via SUBCUTANEOUS
  Filled 2020-03-16 (×29): qty 1

## 2020-03-16 MED ORDER — SODIUM CHLORIDE 0.9 % IV SOLN
INTRAVENOUS | Status: AC
  Filled 2020-03-16: qty 1.2

## 2020-03-16 MED ORDER — ACETAMINOPHEN 325 MG RE SUPP
325.0000 mg | RECTAL | Status: DC | PRN
Start: 2020-03-16 — End: 2020-03-27
  Filled 2020-03-16: qty 2

## 2020-03-16 MED ORDER — PROPOFOL 10 MG/ML IV BOLUS
INTRAVENOUS | Status: DC | PRN
  Administered 2020-03-16: 100 mg via INTRAVENOUS
  Administered 2020-03-16: 40 mg via INTRAVENOUS
  Administered 2020-03-16: 50 mg via INTRAVENOUS

## 2020-03-16 MED ORDER — DOCUSATE SODIUM 100 MG PO CAPS
100.0000 mg | ORAL_CAPSULE | Freq: Two times a day (BID) | ORAL | Status: DC
  Administered 2020-03-16 – 2020-03-27 (×22): 100 mg via ORAL
  Filled 2020-03-16 (×22): qty 1

## 2020-03-16 MED ORDER — OXYCODONE-ACETAMINOPHEN 5-325 MG PO TABS
1.0000 | ORAL_TABLET | ORAL | Status: DC | PRN
  Administered 2020-03-16: 2 via ORAL
  Administered 2020-03-17 – 2020-03-24 (×15): 1 via ORAL
  Administered 2020-03-25 (×2): 2 via ORAL
  Administered 2020-03-26: 1 via ORAL
  Administered 2020-03-27: 2 via ORAL
  Filled 2020-03-16 (×2): qty 1
  Filled 2020-03-16: qty 2
  Filled 2020-03-16 (×3): qty 1
  Filled 2020-03-16: qty 2
  Filled 2020-03-16: qty 1
  Filled 2020-03-16: qty 2
  Filled 2020-03-16 (×9): qty 1
  Filled 2020-03-16: qty 2
  Filled 2020-03-16: qty 1

## 2020-03-16 MED ORDER — HEMOSTATIC AGENTS (NO CHARGE) OPTIME
TOPICAL | Status: DC | PRN
  Administered 2020-03-16: 1 via TOPICAL

## 2020-03-16 MED ORDER — CHLORHEXIDINE GLUCONATE 0.12 % MT SOLN
OROMUCOSAL | Status: AC
  Administered 2020-03-16: 15 mL via OROMUCOSAL
  Filled 2020-03-16: qty 15

## 2020-03-16 MED ORDER — LIDOCAINE 2% (20 MG/ML) 5 ML SYRINGE
INTRAMUSCULAR | Status: DC | PRN
  Administered 2020-03-16: 60 mg via INTRAVENOUS

## 2020-03-16 MED ORDER — ONDANSETRON HCL 4 MG/2ML IJ SOLN
4.0000 mg | Freq: Four times a day (QID) | INTRAMUSCULAR | Status: DC | PRN
  Administered 2020-03-18 – 2020-03-25 (×4): 4 mg via INTRAVENOUS
  Filled 2020-03-16 (×4): qty 2

## 2020-03-16 MED ORDER — FENTANYL CITRATE (PF) 100 MCG/2ML IJ SOLN
INTRAMUSCULAR | Status: AC
  Filled 2020-03-16: qty 2

## 2020-03-16 MED ORDER — ALUM & MAG HYDROXIDE-SIMETH 200-200-20 MG/5ML PO SUSP: 15.0000 mL | ORAL | Status: DC | PRN

## 2020-03-16 MED ORDER — CEFAZOLIN SODIUM-DEXTROSE 2-4 GM/100ML-% IV SOLN
2.0000 g | Freq: Three times a day (TID) | INTRAVENOUS | Status: AC
  Administered 2020-03-17: 02:00:00 2 g via INTRAVENOUS
  Filled 2020-03-16 (×3): qty 100

## 2020-03-16 MED ORDER — GUAIFENESIN-DM 100-10 MG/5ML PO SYRP: 15.0000 mL | ORAL_SOLUTION | ORAL | Status: DC | PRN

## 2020-03-16 MED ORDER — PANTOPRAZOLE SODIUM 40 MG PO TBEC
40.0000 mg | DELAYED_RELEASE_TABLET | Freq: Every day | ORAL | Status: DC
  Administered 2020-03-17 – 2020-03-18 (×2): 40 mg via ORAL
  Filled 2020-03-16 (×2): qty 1

## 2020-03-16 MED ORDER — ALBUMIN HUMAN 5 % IV SOLN
12.5000 g | Freq: Once | INTRAVENOUS | Status: AC
  Administered 2020-03-16: 12.5 g via INTRAVENOUS

## 2020-03-16 MED ORDER — CHLORHEXIDINE GLUCONATE CLOTH 2 % EX PADS
6.0000 | MEDICATED_PAD | Freq: Every day | CUTANEOUS | Status: DC
  Administered 2020-03-19: 04:00:00 6 via TOPICAL

## 2020-03-16 MED ORDER — FENTANYL CITRATE (PF) 250 MCG/5ML IJ SOLN
INTRAMUSCULAR | Status: AC
  Filled 2020-03-16: qty 5

## 2020-03-16 MED ORDER — SUGAMMADEX SODIUM 200 MG/2ML IV SOLN
INTRAVENOUS | Status: DC | PRN
  Administered 2020-03-16: 200 mg via INTRAVENOUS

## 2020-03-16 MED ORDER — ASPIRIN EC 81 MG PO TBEC: 81.0000 mg | DELAYED_RELEASE_TABLET | Freq: Every day | ORAL | Status: DC | PRN

## 2020-03-16 MED ORDER — HEPARIN SODIUM (PORCINE) 1000 UNIT/ML IJ SOLN
INTRAMUSCULAR | Status: DC | PRN
Start: 2020-03-16 — End: 2020-03-16
  Administered 2020-03-16: 3000 [IU] via INTRAVENOUS
  Administered 2020-03-16: 7000 [IU] via INTRAVENOUS

## 2020-03-16 MED ORDER — ATORVASTATIN CALCIUM 10 MG PO TABS
10.0000 mg | ORAL_TABLET | Freq: Every day | ORAL | Status: DC
  Administered 2020-03-17 – 2020-03-27 (×11): 10 mg via ORAL
  Filled 2020-03-16 (×11): qty 1

## 2020-03-16 MED ORDER — TRAZODONE HCL 50 MG PO TABS
25.0000 mg | ORAL_TABLET | Freq: Every evening | ORAL | Status: DC | PRN
  Filled 2020-03-16: qty 1

## 2020-03-16 MED ORDER — FENTANYL CITRATE (PF) 100 MCG/2ML IJ SOLN
25.0000 ug | INTRAMUSCULAR | Status: DC | PRN
  Administered 2020-03-16: 50 ug via INTRAVENOUS

## 2020-03-16 MED ORDER — LABETALOL HCL 5 MG/ML IV SOLN: 10.0000 mg | INTRAVENOUS | Status: DC | PRN

## 2020-03-16 MED ORDER — ONDANSETRON HCL 4 MG/2ML IJ SOLN
INTRAMUSCULAR | Status: DC | PRN
  Administered 2020-03-16: 4 mg via INTRAVENOUS

## 2020-03-16 MED ORDER — LIDOCAINE HCL (PF) 2 % IJ SOLN
INTRAMUSCULAR | Status: AC
  Filled 2020-03-16: qty 5

## 2020-03-16 SURGICAL SUPPLY — 65 items
ADH SKN CLS APL DERMABOND .7 (GAUZE/BANDAGES/DRESSINGS) ×2
AGENT HMST 10 BLLW SHRT CANN (HEMOSTASIS) ×1
BANDAGE ESMARK 6X9 LF (GAUZE/BANDAGES/DRESSINGS) IMPLANT
BNDG CMPR 9X6 STRL LF SNTH (GAUZE/BANDAGES/DRESSINGS) ×1
BNDG ESMARK 6X9 LF (GAUZE/BANDAGES/DRESSINGS) ×3
BNDG GAUZE ELAST 4 BULKY (GAUZE/BANDAGES/DRESSINGS) ×2 IMPLANT
CANISTER SUCT 3000ML PPV (MISCELLANEOUS) ×3 IMPLANT
CANNULA VESSEL 3MM 2 BLNT TIP (CANNULA) ×6 IMPLANT
CLIP LIGATING EXTRA MED SLVR (CLIP) ×3 IMPLANT
CLIP LIGATING EXTRA SM BLUE (MISCELLANEOUS) ×3 IMPLANT
COVER WAND RF STERILE (DRAPES) ×1 IMPLANT
CUFF TOURN SGL QUICK 24 (TOURNIQUET CUFF) ×3
CUFF TOURN SGL QUICK 34 (TOURNIQUET CUFF)
CUFF TOURN SGL QUICK 42 (TOURNIQUET CUFF) IMPLANT
CUFF TRNQT CYL 24X4X16.5-23 (TOURNIQUET CUFF) IMPLANT
CUFF TRNQT CYL 34X4.125X (TOURNIQUET CUFF) IMPLANT
DERMABOND ADVANCED (GAUZE/BANDAGES/DRESSINGS) ×4
DERMABOND ADVANCED .7 DNX12 (GAUZE/BANDAGES/DRESSINGS) IMPLANT
DRAIN SNY 10X20 3/4 PERF (WOUND CARE) IMPLANT
DRAPE HALF SHEET 40X57 (DRAPES) ×2 IMPLANT
DRAPE X-RAY CASS 24X20 (DRAPES) IMPLANT
ELECT REM PT RETURN 9FT ADLT (ELECTROSURGICAL) ×3
ELECTRODE REM PT RTRN 9FT ADLT (ELECTROSURGICAL) ×1 IMPLANT
EVACUATOR SILICONE 100CC (DRAIN) IMPLANT
GLOVE SKINSENSE NS SZ6.5 (GLOVE) ×10
GLOVE SKINSENSE NS SZ7.0 (GLOVE) ×6
GLOVE SKINSENSE NS SZ7.5 (GLOVE) ×4
GLOVE SKINSENSE NS SZ8.0 LF (GLOVE) ×4
GLOVE SKINSENSE STRL SZ6.5 (GLOVE) IMPLANT
GLOVE SKINSENSE STRL SZ7.0 (GLOVE) IMPLANT
GLOVE SKINSENSE STRL SZ7.5 (GLOVE) IMPLANT
GLOVE SKINSENSE STRL SZ8.0 LF (GLOVE) IMPLANT
GLOVE SS BIOGEL STRL SZ 7.5 (GLOVE) ×1 IMPLANT
GLOVE SUPERSENSE BIOGEL SZ 7.5 (GLOVE) ×2
GLOVE SURG UNDER POLY LF SZ6.5 (GLOVE) ×2 IMPLANT
GOWN STRL REUS W/ TWL LRG LVL3 (GOWN DISPOSABLE) ×3 IMPLANT
GOWN STRL REUS W/TWL LRG LVL3 (GOWN DISPOSABLE) ×9
GRAFT PROPATEN W/RING 6X80X60 (Vascular Products) ×2 IMPLANT
HEMOSTAT HEMOBLAST BELLOWS (HEMOSTASIS) ×2 IMPLANT
INSERT FOGARTY SM (MISCELLANEOUS) IMPLANT
KIT BASIN OR (CUSTOM PROCEDURE TRAY) ×3 IMPLANT
KIT TURNOVER KIT B (KITS) ×3 IMPLANT
LOOP VESSEL MINI RED (MISCELLANEOUS) ×2 IMPLANT
NS IRRIG 1000ML POUR BTL (IV SOLUTION) ×6 IMPLANT
PACK PERIPHERAL VASCULAR (CUSTOM PROCEDURE TRAY) ×3 IMPLANT
PAD ARMBOARD 7.5X6 YLW CONV (MISCELLANEOUS) ×6 IMPLANT
PADDING CAST COTTON 6X4 STRL (CAST SUPPLIES) ×2 IMPLANT
PATCH HEMASHIELD 8X75 (Vascular Products) ×2 IMPLANT
PENCIL BUTTON HOLSTER BLD 10FT (ELECTRODE) ×2 IMPLANT
SET COLLECT BLD 21X3/4 12 (NEEDLE) IMPLANT
SPONGE LAP 18X18 X RAY DECT (DISPOSABLE) ×8 IMPLANT
STOPCOCK 4 WAY LG BORE MALE ST (IV SETS) IMPLANT
SUT ETHILON 3 0 PS 1 (SUTURE) IMPLANT
SUT PROLENE 5 0 C 1 24 (SUTURE) ×11 IMPLANT
SUT PROLENE 6 0 CC (SUTURE) ×11 IMPLANT
SUT SILK 2 0 SH (SUTURE) ×3 IMPLANT
SUT SILK 3 0 SH CR/8 (SUTURE) ×2 IMPLANT
SUT VIC AB 2-0 CTX 36 (SUTURE) ×6 IMPLANT
SUT VIC AB 3-0 SH 27 (SUTURE) ×6
SUT VIC AB 3-0 SH 27X BRD (SUTURE) ×2 IMPLANT
TOWEL GREEN STERILE (TOWEL DISPOSABLE) ×3 IMPLANT
TRAY FOLEY MTR SLVR 16FR STAT (SET/KITS/TRAYS/PACK) ×1 IMPLANT
TUBING EXTENTION W/L.L. (IV SETS) IMPLANT
UNDERPAD 30X36 HEAVY ABSORB (UNDERPADS AND DIAPERS) ×3 IMPLANT
WATER STERILE IRR 1000ML POUR (IV SOLUTION) ×3 IMPLANT

## 2020-03-16 NOTE — Progress Notes (Signed)
Patient received into room 4E03 from PACU. Vital signs taken and charted. CHG and skin assessment completed. Cardiac monitor applied and central telemetry notified. No patient complaints at this time. Will continue to monitor.   -Donnelly Angelica, RN

## 2020-03-16 NOTE — Transfer of Care (Signed)
Immediate Anesthesia Transfer of Care Note  Patient: [name]  Procedure(s) Performed: RIGHT EXTERNAL ILIAC  ENDARTERECTOMY WITH PATCH ANGIOPLASTY (Right Leg Upper) RIGHT COMMON FEMORAL TO ANTERIOR TIBIAL ARTERY BYPASS GRAFT USING PROPATEN GRAFT (Right Leg Upper)  Patient Location: PACU  Anesthesia Type:General  Level of Consciousness: drowsy and patient cooperative  Airway & Oxygen Therapy: Patient Spontanous Breathing and Patient connected to face mask oxygen  Post-op Assessment: Report given to RN and Post -op Vital signs reviewed and stable  Post vital signs: Reviewed and stable  Last Vitals:  Vitals Value Taken Time  BP 88/41 03/16/20 1156  Temp    Pulse 76 03/16/20 1158  Resp 16 03/16/20 1158  SpO2 97 % 03/16/20 1158  Vitals shown include unvalidated device data.  Last Pain:  Vitals:   03/16/20 0604  TempSrc: Oral  PainSc: 5       Patients Stated Pain Goal: 3 (67/34/19 3790)  Complications: No complications documented.

## 2020-03-16 NOTE — Anesthesia Procedure Notes (Signed)
Procedure Name: Intubation Date/Time: 03/16/2020 7:53 AM Performed by: Colin Benton, CRNA Pre-anesthesia Checklist: Patient identified, Emergency Drugs available, Suction available and Patient being monitored Patient Re-evaluated:Patient Re-evaluated prior to induction Oxygen Delivery Method: Circle system utilized Preoxygenation: Pre-oxygenation with 100% oxygen Induction Type: IV induction Ventilation: Mask ventilation without difficulty Laryngoscope Size: Miller and 2 Grade View: Grade I Tube type: Oral Tube size: 7.0 mm Number of attempts: 1 Airway Equipment and Method: Stylet Placement Confirmation: ETT inserted through vocal cords under direct vision,  positive ETCO2 and breath sounds checked- equal and bilateral Secured at: 22 cm Tube secured with: Tape Dental Injury: Teeth and Oropharynx as per pre-operative assessment

## 2020-03-16 NOTE — Op Note (Signed)
OPERATIVE REPORT  DATE OF SURGERY: 03/16/2020  PATIENT: [name], 74 y.o. female MRN: [id number]  DOB: February 05, 1946  PRE-OPERATIVE DIAGNOSIS: Critical limb ischemia right foot  POST-OPERATIVE DIAGNOSIS:  Same  PROCEDURE: #1 right external iliac and common femoral endarterectomy with Dacron patch angioplasty, #2 right femoral to anterior tibial bypass with 6 mm ringed propatent Gore-Tex graft  SURGEON:  Curt Jews, M.D.  PHYSICIAN ASSISTANT: Dr. Stanford Breed, Arlee Muslim, PA-C  The assistant was needed for exposure and to expedite the case  ANESTHESIA: General  EBL: per anesthesia record  Total I/O In: 7741 [I.V.:700; Blood:315; IV Piggyback:600] Out: 1000 [Blood:1000]  BLOOD ADMINISTERED: none  DRAINS: none  SPECIMEN: none  COUNTS CORRECT:  YES  PATIENT DISPOSITION:  PACU - hemodynamically stable  PROCEDURE DETAILS: The patient was taken the operating placed supine position at area of the right groin right leg prepped draped in sterile fashion.  Incision was made over the common femoral artery.  SonoSite ultrasound was used to visualize the vein which had been harvested and there was no usable accessory vein or small saphenous vein.  The patient had extreme amount of calcification in the distal iliac and common femoral artery.  The external iliac artery was encircled above the level of the inguinal ligament and tributary branches were controlled with red Vesseloops.  The suprasternal artery was chronically occluded at its origin.  The profundus femoris artery was large and was dissected out further distal to this.  Separate incision was made over the medial approach to the popliteal artery.  Preoperative arteriogram revealed occlusion of the popliteal artery but patency of the anterior tibial by collaterals at its origin.  The popliteal artery and tibioperoneal trunk and anterior tibial arteries were controlled.  A tunnel was created from the level of the below-knee  popliteal space to the groin.  The patient was given 8000 intravenous heparin after adequate circulation time the external iliac and profundus were occluded.  The common femoral artery was opened with an 11 blade similarly with Potts scissors.  This was continued above the level of the high-grade stenosis in the common femoral artery.  The superficial femoral artery was ligated and transected.  Endarterectomy was begun on the external iliac and continued onto the common femoral and down to the profundus femoris takeoff.  There was very good flow and wide channel into the femoris artery.  A Hemashield Dacron patch was brought onto the field and was sewn as a patch angioplasty beginning on the distal external iliac and continued onto the profunda.  This anastomosis was tested and found to be adequate.  Next the pneumatic tourniquet was placed on the above-knee position after placing a web roll.  The leg was elevated and exsanguinated with an Esmarch tourniquet.  The pneumatic tourniquet was inflated to 250 mmHg.  The popliteal artery was opened with an 11 blade and there was dense calcification.  The origin of the anterior tibial artery was widely patent and a 2 dilator passed through this.  The popliteal artery was endarterectomized.  A 6 mm propatent Gore-Tex graft was brought onto the field and was brought through the prior created tunnel.  The tunneler was removed.  The graft was transected and was sewn end-to-side to the endarterectomized popliteal artery over the origin of the anterior tibial artery takeoff.  After completion of this anastomosis the graft was flushed with heparinized saline and reoccluded.  Finally the external iliac and profundus arteries were reoccluded and a an incision was  made in the hood of the prior placed Dacron patch angioplasty graft.  The Gore-Tex graft was cut to the appropriate length and was spatulated and sewn end-to-side to this with a running 6-0 Prolene suture.  Plans removed  and excellent flow was noted through the profunda and the anterior tibial artery.  The patient was given 100 mg protamine reverse heparin.  Wounds irrigated with saline.  Hemostasis left cautery.  Wounds were closed with 2-0 Vicryl in the fascia and the skin was closed with 3-0 subcuticular Vicryl sutures.  Patient did have ongoing oozing throughout the procedure and was transfused 1 unit of packed red blood cells.   [name], M.D., [facility] 03/16/2020 3:45 PM

## 2020-03-16 NOTE — Consult Note (Addendum)
[facility] Renal Consultation Note    Indication for Consultation:  Management of ESRD/hemodialysis; anemia, hypertension/volume and secondary hyperparathyroidism  SFK:CLEXNTZ, Gwyndolyn Saxon, MD  HPI: [name] is a 74 y.o. female with ESRD on HD TTS at [facility].  Past medical history significant for CAD s/p CABG, ischemic CM, combinded systolic/diastolic HF, HTN, DMT2, Hx breast cancer s/p lumpectomy/radiation and PVD s/p revascularization on L with toe amputations.   Patient seen and examined at bedside in room post procedure.   Presented to the hospital for planned revascularization of RLE due critical limb ischemia and non healing R foot wound.  Reports wound to right foot for the last several months.  Followed by podiatry then wound care with no improvement noted.  Admits to pain in L foot/leg.  Denies CP, SOB, n/v/d, abdominal pain, weakness, dizziness and fatigue.  Reports dialysis has been going well at outpatient center.  Pertinent labs include K 3.4 and Hgb 7.1.    Past Medical History:  Diagnosis Date  . Allergy   . Anterior dislocation of left shoulder 05/27/2019  . Arthritis    "maybe in my lower back" (08/27/2012)  . 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 Chronic combined systolic and diastolic CHF (congestive heart failure) ([place])   . CKD (chronic kidney disease), stage IV ([place])   . Coronary artery disease    a. NSTEMI in 05/2009 s/p CABG (LIMA-LAD, SVG-diagonal, SVG-OM1/OM 2).   Marland Kitchen Dyspnea    with exertion  . Family history of anesthesia complication    Mom has a hard time to wake up  . Foot ulcer ([place])    "I've had them on both feet" (08/27/2012)  . Gouty arthritis    "right index finger" (08/27/2012)  . History of radiation therapy 06/09/13-07/06/13   left breast 50Gy  . Hyperlipidemia   . Hypertension   . Infection    right second toe  . Ischemic cardiomyopathy    a. 2011: EF 40-45%. b. EF 55-60% in 04/2017  but shortly after as inpatient was 45-50%.  Marland Kitchen LBBB (left bundle branch block)   . Mitral regurgitation   . Neuropathy    Hx; of B/L feet  . NSTEMI (non-ST elevated myocardial infarction) ([place]) 05/23/2009  . Osteomyelitis of foot ([place])   . PAD (peripheral artery disease) ([facility])    a. LE PAD (patient previously elected hold off L fem-pop), prev followed by Dr. Bridgett Larsson  . PONV (postoperative nausea and vomiting)   . Sinus headache    "occasionally" (08/27/2012)  . Type II diabetes mellitus ([place])   . Vitamin D deficiency    Past Surgical History:  Procedure Laterality Date  . ABDOMINAL AORTOGRAM W/LOWER EXTREMITY Bilateral 03/25/2019   Procedure: ABDOMINAL AORTOGRAM W/LOWER EXTREMITY;  Surgeon: Marty Heck, MD;  Location: [place] [facility];  Service: Cardiovascular;  Laterality: Bilateral;  . ABDOMINAL AORTOGRAM W/LOWER EXTREMITY Bilateral 02/24/2020   Procedure: ABDOMINAL AORTOGRAM W/LOWER EXTREMITY;  Surgeon: Cherre Robins, MD;  Location: [place] [facility];  Service: Cardiovascular;  Laterality: Bilateral;  . AMPUTATION Right 08/27/2012   Procedure: AMPUTATION RAY;  Surgeon: Newt Minion, MD;  Location: [place];  Service: Orthopedics;  Laterality: Right;  Right Foot 5th Ray Amputation  . AMPUTATION Left 07/08/2013   Procedure: AMPUTATION RAY;  Surgeon: Newt Minion, MD;  Location: [place];  Service: Orthopedics;  Laterality: Left;  Left Foot 2nd Ray Amputation  . AMPUTATION Left 04/06/2019  Procedure: LEFT 5TH RAY AMPUTATION;  Surgeon: Newt Minion, MD;  Location: [place];  Service: Orthopedics;  Laterality: Left;  . AMPUTATION RAY Right 08/27/2012   5th ray/notes 08/27/2012  . AMPUTATION TOE Right 03/06/2017   Procedure: AMPUTATION TOE, INTERPHANGEAL 2ND RIGHT;  Surgeon: [name], DPM;  Location: [place];  Service: Podiatry;  Laterality: Right;  . AV FISTULA PLACEMENT Right 11/11/2017   Procedure: RIGHT RADIOCEPHALIC  ARTERIOVENOUS FISTULA;  Surgeon: [name], MD;   Location: [place];  Service: Vascular;  Laterality: Right;  . BREAST LUMPECTOMY  04/20/2013   with biopsy      DR WAKEFIELD  . BREAST LUMPECTOMY WITH NEEDLE LOCALIZATION AND AXILLARY SENTINEL LYMPH NODE BX Left 04/20/2013   Procedure: LEFT BREAST WIRE GUIDED LUMPECTOMY AND AXILLARY SENTINEL LYMPH NODE BX;  Surgeon: Rolm Bookbinder, MD;  Location: [place];  Service: General;  Laterality: Left;  . CARDIAC CATHETERIZATION  05/24/2009   Archie Endo 05/24/2009 (08/27/2012)  . CATARACT EXTRACTION W/ INTRAOCULAR LENS  IMPLANT, BILATERAL  2000's  . COLONOSCOPY W/ BIOPSIES AND POLYPECTOMY  2010  . CORONARY ARTERY BYPASS GRAFT  2011   "CABG X4" (08/27/2012)  . DENTAL SURGERY  04/30/11   "1 implant" (08/27/2012)  . DILATION AND CURETTAGE OF UTERUS    . ENDARTERECTOMY FEMORAL Left 03/28/2019   Procedure: ENDARTERECTOMY FEMORAL;  Surgeon: [name], MD;  Location: [place];  Service: Vascular;  Laterality: Left;  . EYE SURGERY    . FEMORAL-POPLITEAL BYPASS GRAFT Left 03/28/2019   Procedure: BYPASS GRAFT FEMORAL-POPLITEAL ARTERY;  Surgeon: [name], MD;  Location: [place];  Service: Vascular;  Laterality: Left;  . FINGER SURGERY Right    "index finger; turned out to be gout" (08/27/2012)  . IR FLUORO GUIDE CV LINE RIGHT  11/30/2017  . PATCH ANGIOPLASTY Left 03/28/2019   Procedure: Patch Angioplasty;  Surgeon: [name], MD;  Location: [place];  Service: Vascular;  Laterality: Left;  . SHOULDER CLOSED REDUCTION Left 05/27/2019   Procedure: CLOSED REDUCTION LEFT SHOULDER;  Surgeon: Mcarthur Rossetti, MD;  Location: [facility];  Service: Orthopedics;  Laterality: Left;  . TEE WITHOUT CARDIOVERSION N/A 12/01/2017   Procedure: TRANSESOPHAGEAL ECHOCARDIOGRAM (TEE);  Surgeon: Lelon Perla, MD;  Location: [facility] ENDOS[facility];  Service: Cardiovascular;  Laterality: N/A;   Family History  Problem Relation Age of Onset  . Kidney cancer Brother 14  . Hypertension Brother   . Breast cancer Sister 29       LCIS; BRCA  negative  . Throat cancer Father 34       smoker  . Breast cancer Sister 34       Lobular breast cancer  . Breast cancer Maternal Aunt        dx in her 38s   Social History:  reports that she has never smoked. She has never used smokeless tobacco. She reports previous alcohol use. She reports that she does not use drugs. Allergies  Allergen Reactions  . Atenolol Rash and Other (See Comments)    Exacerbates gout   . Adhesive [Tape] Other (See Comments)    SKIN IS VERY SENSITIVE AND BRUISES AND TEARS EASILY; PLEASE USE AN ALTERNATIVE TO TAPE!!  . Contrast Media [Iodinated Diagnostic Agents] Rash  . Iohexol Rash  . Latex Rash   Prior to Admission medications   Medication Sig Start Date End Date Taking? Authorizing Provider  allopurinol (ZYLOPRIM) 100 MG tablet TAKE 1 TABLET TWICE DAILY TO PREVENT GOUT Patient taking differently:  Take 100 mg by mouth 2 (two) times daily. 10/28/19  Yes Unk Pinto, MD  ARTIFICIAL TEAR SOLUTION OP Place 1 drop into both eyes 2 (two) times daily as needed (dry eyes). Refresh   Yes [provider]  aspirin EC 81 MG tablet Take 81 mg by mouth daily as needed (Will not take if take Excedrin).    Yes [provider]  aspirin-acetaminophen-caffeine (EXCEDRIN EXTRA STRENGTH) [phone] MG tablet Take 1 tablet by mouth 2 (two) times daily as needed (Low back pain/  will not take is take 81 mg asprin).    Yes [provider]  atorvastatin (LIPITOR) 10 MG tablet TAKE 1 TABLET DAILY FOR CHOLESTEROL. Patient taking differently: Take 10 mg by mouth daily. 03/07/20  Yes McClanahan, Danton Sewer, NP  b complex-vitamin c-folic acid (NEPHRO-VITE) 0.8 MG TABS tablet Take 1 tablet by mouth daily.  03/19/18  Yes [provider]  cinacalcet (SENSIPAR) 30 MG tablet Take 1 tablet (30 mg total) by mouth daily with supper. Patient taking differently: Take 30 mg by mouth daily with breakfast. 04/19/19  Yes Angiulli, Lavon Paganini, PA-C  Darbepoetin Alfa  (ARANESP) 100 MCG/0.5ML SOSY injection Inject 0.5 mLs (100 mcg total) into the vein every Tuesday with hemodialysis. 04/19/19  Yes Angiulli, Lavon Paganini, PA-C  docusate sodium (COLACE) 100 MG capsule Take 100 mg by mouth 2 (two) times daily.    Yes [provider]  doxercalciferol (HECTOROL) 4 MCG/2ML injection Inject 2 mLs (4 mcg total) into the vein Every Tuesday,Thursday,and Saturday with dialysis. 04/19/19  Yes Angiulli, Lavon Paganini, PA-C  ferric gluconate 125 mg in sodium chloride 0.9 % 100 mL Inject 125 mg into the vein Every Tuesday,Thursday,and Saturday with dialysis. 04/19/19  Yes Angiulli, Lavon Paganini, PA-C  glucose blood (PRODIGY NO CODING BLOOD GLUC) test strip USE 1 STRIP TO CHECK GLUCOSE THREE TIMES DAILY-[id number] 11/29/19  Yes Unk Pinto, MD  Homeopathic Products ([place]) Apply 1 application topically daily as needed (pain).   Yes [provider]  hydrALAZINE (APRESOLINE) 25 MG tablet Take     3 tablets (75 mg)      3 x /day        for BP & Heart Patient taking differently: Take 75 mg by mouth See admin instructions. Take 75 mg  three times a day for BP & Heart On dialysis day will only take 75 mg twice a day 01/23/20  Yes Unk Pinto, MD  HYDROcodone-acetaminophen (NORCO/VICODIN) 5-325 MG tablet Take 1 tablet by mouth every 6 (six) hours as needed. Patient taking differently: Take 0.5 tablets by mouth 3 (three) times a week. Tues Thurs and Saturday 01/25/20  Yes Felipa Furnace, DPM  insulin NPH-regular Human (70-30) 100 UNIT/ML injection Inject 10-30 Units into the skin 2 (two) times daily with a meal. Sliding scale If blood sugar is below 79 do not take insulin   Yes [provider]  Insulin Pen Needle (BD PEN NEEDLE NANO U/F) 32G X 4 MM MISC USE TO INJECT TWICE DAILY LANTUS INJECTIONS 01/27/20  Yes Unk Pinto, MD  Insulin Syringe-Needle U-100 (BD INSULIN SYRINGE U/F) 31G X 5/16" 1 ML MISC Administer Insulin      3 x /day      with Meals       (Dx-e11.29) 03/06/20  Yes Unk Pinto, MD  isosorbide dinitrate (ISORDIL) 10 MG tablet TAKE 1 TABLET THREE TIMES A DAY FOR BLOOD PRESSURE AND HEART Patient taking differently: Take 10 mg by mouth 3 (three) times  daily. FOR BLOOD PRESSURE AND HEART 08/12/19  Yes Unk Pinto, MD  METAMUCIL FIBER PO Take 1 capsule by mouth daily as needed (Constipation).    Yes [provider]  Methoxy PEG-Epoetin Beta (MIRCERA IJ) Mircera 05/26/19 05/24/20 Yes [provider]  Multiple Vitamins-Minerals (PRESERVISION AREDS 2 PO) Take 2 capsules by mouth daily at 12 noon.    Yes [provider]  Probiotic Product ([place]) CAPS Take 1 capsule by mouth daily.    Yes [provider]  SANTYL ointment Apply 1 application topically See admin instructions. Weekly or some time 2-3 times weekly 02/01/20  Yes [provider]  sevelamer carbonate (RENVELA) 800 MG tablet Take 2 tablets (1,600 mg total) by mouth 3 (three) times daily with meals. Patient taking differently: Take 1,600 mg by mouth See admin instructions. Take 1600 mg with each meal and depending on the snack take 800 mg-1600 mg 04/19/19  Yes Angiulli, Lavon Paganini, PA-C  traZODone (DESYREL) 50 MG tablet Take 25-50 mg by mouth at bedtime.   Yes [provider]  cyclobenzaprine (FLEXERIL) 5 MG tablet Take 1 tablet (5 mg total) by mouth 3 (three) times daily as needed for muscle spasms. Patient not taking: Reported on 02/23/2020 06/22/19   Mcarthur Rossetti, MD   Current Facility-Administered Medications  Medication Dose Route Frequency Provider Last Rate Last Admin  . 0.9 %  sodium chloride infusion  500 mL Intravenous Once PRN Dagoberto Ligas, PA-C      . acetaminophen (TYLENOL) tablet 325-650 mg  325-650 mg Oral Q4H PRN Dagoberto Ligas, PA-C       Or  . acetaminophen (TYLENOL) suppository 325-650 mg  325-650 mg Rectal Q4H PRN Dagoberto Ligas, PA-C      . albumin human 5 % solution            . [START ON 03/17/2020] allopurinol (ZYLOPRIM) tablet 100 mg  100 mg Oral BID Dagoberto Ligas, PA-C      . alum & mag hydroxide-simeth (MAALOX/MYLANTA) 200-200-20 MG/5ML suspension 15-30 mL  15-30 mL Oral Q2H PRN Dagoberto Ligas, PA-C      . aspirin EC tablet 81 mg  81 mg Oral Daily PRN Dagoberto Ligas, PA-C      . atorvastatin (LIPITOR) tablet 10 mg  10 mg Oral Daily Eveland, Matthew, PA-C      . ceFAZolin (ANCEF) IVPB 2g/100 mL premix  2 g Intravenous Q8H Eveland, Matthew, PA-C      . docusate sodium (COLACE) capsule 100 mg  100 mg Oral BID Dagoberto Ligas, PA-C      . fentaNYL (SUBLIMAZE) 100 MCG/2ML injection           . guaiFENesin-dextromethorphan (ROBITUSSIN DM) 100-10 MG/5ML syrup 15 mL  15 mL Oral Q4H PRN Dagoberto Ligas, PA-C      . [START ON 03/17/2020] heparin injection 5,000 Units  5,000 Units Subcutaneous Q8H Eveland, Matthew, PA-C      . hydrALAZINE (APRESOLINE) injection 5 mg  5 mg Intravenous Q20 Min PRN Dagoberto Ligas, PA-C      . hydrALAZINE (APRESOLINE) tablet 75 mg  75 mg Oral TID Dagoberto Ligas, PA-C      . insulin aspart (novoLOG) injection 0-15 Units  0-15 Units Subcutaneous TID WC Dagoberto Ligas, PA-C      . isosorbide dinitrate (ISORDIL) tablet 10 mg  10 mg Oral TID Dagoberto Ligas, PA-C      . labetalol (NORMODYNE) injection 10 mg  10 mg Intravenous Q10 min PRN Dagoberto Ligas, PA-C      .  magnesium sulfate IVPB 2 g 50 mL  2 g Intravenous Daily PRN Dagoberto Ligas, PA-C      . metoprolol tartrate (LOPRESSOR) injection 2-5 mg  2-5 mg Intravenous Q2H PRN Dagoberto Ligas, PA-C      . morphine 2 MG/ML injection 2 mg  2 mg Intravenous Q2H PRN Dagoberto Ligas, PA-C      . ondansetron (ZOFRAN) injection 4 mg  4 mg Intravenous Q6H PRN Dagoberto Ligas, PA-C      . oxyCODONE-acetaminophen (PERCOCET/ROXICET) 5-325 MG per tablet 1-2 tablet  1-2 tablet Oral Q4H PRN Dagoberto Ligas, PA-C      . pantoprazole (PROTONIX) EC tablet 40 mg  40 mg Oral Daily Eveland,  Matthew, PA-C      . phenol (CHLORASEPTIC) mouth spray 1 spray  1 spray Mouth/Throat PRN Dagoberto Ligas, PA-C      . potassium chloride SA (KLOR-CON) CR tablet 20-40 mEq  20-40 mEq Oral Daily PRN Dagoberto Ligas, PA-C      . traZODone (DESYREL) tablet 25 mg  25 mg Oral QHS Dagoberto Ligas, PA-C      . traZODone (DESYREL) tablet 25 mg  25 mg Oral QHS PRN Blenda Nicely [facility]       Labs: Basic Metabolic Panel: Recent Labs  Lab 03/14/20 1421 03/16/20 0603 03/16/20 1237  NA 136 138 139  K 3.8 3.3* 3.4*  CL 93* 94* 100  CO2 30  --   --   GLUCOSE 92 94 112*  BUN 37* 30* 26*  CREATININE 3.71* 3.00* 3.00*  CALCIUM 9.0  --   --    Liver Function Tests: Recent Labs  Lab 03/14/20 1421  AST 20  ALT 17  ALKPHOS 182*  BILITOT 0.8  PROT 6.5  ALBUMIN 3.6   CBC: Recent Labs  Lab 03/14/20 1421 03/16/20 0603 03/16/20 1237  WBC 7.8  --   --   HGB 12.5 14.3 7.1*  HCT 36.6 42.0 21.0*  MCV 97.9  --   --   PLT 151  --   --    CBG: Recent Labs  Lab 03/14/20 1422 03/16/20 0609 03/16/20 1157  GLUCAP 98 93 108*    ROS: All others negative except those listed in HPI.  Physical Exam: Vitals:   03/16/20 1317 03/16/20 1330 03/16/20 1353 03/16/20 1500  BP: (!) 107/41  (!) 111/46 (!) 100/46  Pulse: 72  72 75  Resp: '12  10 20  ' Temp: 97.6 F (36.4 C) (!) 97.3 F (36.3 C)  97.6 F (36.4 C)  TempSrc: Tympanic   Oral  SpO2:   100% 100%  Weight:      Height:         General: WDWN pleasant female in NAD Head: NCAT sclera not icteric MMM Neck: Supple. No lymphadenopathy Lungs: CTA bilaterally. No wheeze, rales or rhonchi. Breathing is unlabored on 2L via [place] Heart: RRR. No murmur, rubs or gallops.  Abdomen: soft, nontender, +BS, no guarding, no rebound tenderness Lower extremities:trace edema on L, L foot dressed, incision on L leg clean, dry intact, R toes dressed, no RLE edema.  Neuro: AAOx3. Moves all extremities spontaneously. Psych:  Responds to questions appropriately  with a normal affect. Dialysis Access:RU AVF +b  Dialysis Orders:  TTS - [facility]  3hrs 64mn, BFR 350, DFR 800,  EDW 70kg, 2K/ 2Ca, P2  Access: RU AVF   Heparin 2500 Mircera 30 mcg q4wks - last 11/4 Hectorol 350m IV qHD  Assessment/Plan: 1.  non healing ulcer R foot/critical  limb ischemia - s/p R external iliac & common fem endarterectomy w/darcon patch angioplasty and R fem to anterior tibial bypass today by Dr. Donnetta Hutching.  Per [facility] 2.  ESRD -  On HD TTS.  K 3.4 - K supplement give, will check prior to HD and use ^K bath if needed.  Plan for HD tomorrow per regular schedule. Hold heparin d/t surgery today. 3.  Hypertension/volume  - BP variable.  Continue home meds. Does not appear grossly volume overloaded. UF as tolerated.   4.  Anemia of CKD - Hgb 7.1 post procedure.  1 unit pRBC ordered.  Will start ESA with HD tomorrow.  Check iron labs.  5.  Secondary Hyperparathyroidism -  Ca and phos usually in goal as OP.  Continue VDRA and binders. 3 renvela w/meals, 1 with snacks. 6.  Nutrition - Renal diet w/ fluid restrictions.  7. Hx CAD 8. Systolic/diastolic HF  Jen Mow, PA-C [facility] 03/16/2020, 3:50 PM   Nephrology attending: Seen and examined personally and I agree with assessment plan as outlined above. Status post vascular surgery of right leg for critical limb ischemia.  Plan for next dialysis per regular schedule.  Volume status acceptable.  Received PRBC for anemia. Discussed with the patient husband at bedside. Katheran James, MD [facility].

## 2020-03-16 NOTE — Anesthesia Postprocedure Evaluation (Signed)
Anesthesia Post Note  Patient: [name]  Procedure(s) Performed: RIGHT EXTERNAL ILIAC  ENDARTERECTOMY WITH PATCH ANGIOPLASTY (Right Leg Upper) RIGHT COMMON FEMORAL TO ANTERIOR TIBIAL ARTERY BYPASS GRAFT USING PROPATEN GRAFT (Right Leg Upper)     Patient location during evaluation: PACU Anesthesia Type: General Level of consciousness: awake and alert Pain management: pain level controlled Vital Signs Assessment: post-procedure vital signs reviewed and stable Respiratory status: spontaneous breathing, nonlabored ventilation, respiratory function stable and patient connected to nasal cannula oxygen Cardiovascular status: blood pressure returned to baseline and stable Postop Assessment: no apparent nausea or vomiting Anesthetic complications: no   No complications documented.  Last Vitals:  Vitals:   03/16/20 1353 03/16/20 1500  BP: (!) 111/46 (!) 100/46  Pulse: 72 75  Resp: 10 20  Temp:  36.4 C  SpO2: 100% 100%    Last Pain:  Vitals:   03/16/20 1500  TempSrc: Oral  PainSc:                  Tiajuana Amass

## 2020-03-16 NOTE — Interval H&P Note (Signed)
History and Physical Interval Note:  03/16/2020 7:18 AM  [name]  has presented today for surgery, with the diagnosis of RIGHT FOOT WOUND.  The various methods of treatment have been discussed with the patient and family. After consideration of risks, benefits and other options for treatment, the patient has consented to  Procedure(s): RIGHT FEMORAL ENDARTERECTOMY (Right) RIGHT FEMORAL-POPLITEAL ARTERY BYPASS GRAFT (Right) as a surgical intervention.  The patient's history has been reviewed, patient examined, no change in status, stable for surgery.  I have reviewed the patient's chart and labs.  Questions were answered to the patient's satisfaction.     Curt Jews

## 2020-03-17 LAB — RENAL FUNCTION PANEL
Albumin: 3 g/dL — ABNORMAL LOW (ref 3.5–5.0)
Anion gap: 12 (ref 5–15)
BUN: 39 mg/dL — ABNORMAL HIGH (ref 8–23)
CO2: 26 mmol/L (ref 22–32)
Calcium: 7.9 mg/dL — ABNORMAL LOW (ref 8.9–10.3)
Chloride: 98 mmol/L (ref 98–111)
Creatinine, Ser: 4.26 mg/dL — ABNORMAL HIGH (ref 0.44–1.00)
GFR, Estimated: 10 mL/min — ABNORMAL LOW (ref >60)
Glucose, Bld: 225 mg/dL — ABNORMAL HIGH (ref 70–99)
Phosphorus: 5.7 mg/dL — ABNORMAL HIGH (ref 2.5–4.6)
Potassium: 3.8 mmol/L (ref 3.5–5.1)
Sodium: 136 mmol/L (ref 135–145)

## 2020-03-17 LAB — TYPE AND SCREEN
ABO/RH(D): B POS
Antibody Screen: NEGATIVE
Unit division: 0

## 2020-03-17 LAB — LIPID PANEL
Cholesterol: 54 mg/dL (ref 0–200)
HDL: 24 mg/dL — ABNORMAL LOW (ref >40)
LDL Cholesterol: 21 mg/dL (ref 0–99)
Total CHOL/HDL Ratio: 2.3 RATIO
Triglycerides: 47 mg/dL (ref <150)
VLDL: 9 mg/dL (ref 0–40)

## 2020-03-17 LAB — CBC
HCT: 21.6 % — ABNORMAL LOW (ref 36.0–46.0)
HCT: 25.7 % — ABNORMAL LOW (ref 36.0–46.0)
Hemoglobin: 7.5 g/dL — ABNORMAL LOW (ref 12.0–15.0)
Hemoglobin: 8.2 g/dL — ABNORMAL LOW (ref 12.0–15.0)
MCH: 31.4 pg (ref 26.0–34.0)
MCH: 33.3 pg (ref 26.0–34.0)
MCHC: 31.9 g/dL (ref 30.0–36.0)
MCHC: 34.7 g/dL (ref 30.0–36.0)
MCV: 96 fL (ref 80.0–100.0)
MCV: 98.5 fL (ref 80.0–100.0)
Platelets: 118 10*3/uL — ABNORMAL LOW (ref 150–400)
Platelets: 143 10*3/uL — ABNORMAL LOW (ref 150–400)
RBC: 2.25 MIL/uL — ABNORMAL LOW (ref 3.87–5.11)
RBC: 2.61 MIL/uL — ABNORMAL LOW (ref 3.87–5.11)
RDW: 17 % — ABNORMAL HIGH (ref 11.5–15.5)
RDW: 17 % — ABNORMAL HIGH (ref 11.5–15.5)
WBC: 6.8 10*3/uL (ref 4.0–10.5)
WBC: 7.8 10*3/uL (ref 4.0–10.5)
nRBC: 0 % (ref 0.0–0.2)
nRBC: 0 % (ref 0.0–0.2)

## 2020-03-17 LAB — BASIC METABOLIC PANEL
Anion gap: 11 (ref 5–15)
BUN: 34 mg/dL — ABNORMAL HIGH (ref 8–23)
CO2: 30 mmol/L (ref 22–32)
Calcium: 7.7 mg/dL — ABNORMAL LOW (ref 8.9–10.3)
Chloride: 99 mmol/L (ref 98–111)
Creatinine, Ser: 3.96 mg/dL — ABNORMAL HIGH (ref 0.44–1.00)
GFR, Estimated: 11 mL/min — ABNORMAL LOW (ref >60)
Glucose, Bld: 203 mg/dL — ABNORMAL HIGH (ref 70–99)
Potassium: 4 mmol/L (ref 3.5–5.1)
Sodium: 140 mmol/L (ref 135–145)

## 2020-03-17 LAB — BPAM RBC
Blood Product Expiration Date: 202201122359
ISSUE DATE / TIME: 202112101307
Unit Type and Rh: 7300

## 2020-03-17 LAB — GLUCOSE, CAPILLARY
Glucose-Capillary: 166 mg/dL — ABNORMAL HIGH (ref 70–99)
Glucose-Capillary: 149 mg/dL — ABNORMAL HIGH (ref 70–99)
Glucose-Capillary: 157 mg/dL — ABNORMAL HIGH (ref 70–99)
Glucose-Capillary: 121 mg/dL — ABNORMAL HIGH (ref 70–99)

## 2020-03-17 MED ORDER — LIDOCAINE HCL (PF) 1 % IJ SOLN: 5.0000 mL | INTRAMUSCULAR | Status: DC | PRN

## 2020-03-17 MED ORDER — ALTEPLASE 2 MG IJ SOLR: 2.0000 mg | Freq: Once | INTRAMUSCULAR | Status: DC | PRN

## 2020-03-17 MED ORDER — LIDOCAINE-PRILOCAINE 2.5-2.5 % EX CREA: 1.0000 "application " | TOPICAL_CREAM | CUTANEOUS | Status: DC | PRN

## 2020-03-17 MED ORDER — PENTAFLUOROPROP-TETRAFLUOROETH EX AERO: 1.0000 "application " | INHALATION_SPRAY | CUTANEOUS | Status: DC | PRN

## 2020-03-17 MED ORDER — HEPARIN SODIUM (PORCINE) 1000 UNIT/ML DIALYSIS
1000.0000 [IU] | INTRAMUSCULAR | Status: DC | PRN
Start: 2020-03-17 — End: 2020-03-17

## 2020-03-17 MED ORDER — SODIUM CHLORIDE 0.9 % IV SOLN: 100.0000 mL | INTRAVENOUS | Status: DC | PRN

## 2020-03-17 MED ORDER — DOXERCALCIFEROL 4 MCG/2ML IV SOLN
INTRAVENOUS | Status: AC
  Administered 2020-03-17: 3 ug via INTRAVENOUS
  Filled 2020-03-17: qty 2

## 2020-03-17 MED ORDER — OXYCODONE-ACETAMINOPHEN 5-325 MG PO TABS
ORAL_TABLET | ORAL | Status: AC
  Administered 2020-03-17: 2 via ORAL
  Filled 2020-03-17: qty 2

## 2020-03-17 MED ORDER — DARBEPOETIN ALFA 60 MCG/0.3ML IJ SOSY
PREFILLED_SYRINGE | INTRAMUSCULAR | Status: AC
  Administered 2020-03-17: 60 ug via INTRAVENOUS
  Filled 2020-03-17: qty 0.3

## 2020-03-17 NOTE — Progress Notes (Signed)
Pt is off the floor to HD, will retry as time and pt allow.  03/17/20 1000  PT Visit Information  Reason Eval/Treat Not Completed Other (comment);Patient at procedure or test/unavailable   Mee Hives, PT MS Acute Rehab Dept. Number: [facility]

## 2020-03-17 NOTE — Progress Notes (Signed)
Mobility Specialist - Progress Note   03/17/20 1550  Mobility  Activity  (Cancel)   Pt being seen by PT. Will f/u as able.   Pricilla Handler Mobility Specialist Mobility Specialist Phone: [phone]

## 2020-03-17 NOTE — Progress Notes (Signed)
OT Cancellation Note  Patient Details Name: [name] MRN: [id number] DOB: [birth year]   Cancelled Treatment:     Pt currently at procedure (HD); will continue to follow and attempt eval as time allows.  Hoy Morn, [place], OTR/L  Supplemental Rehabilitation Services  [phone]  03/17/2020, 10:28 AM

## 2020-03-17 NOTE — Progress Notes (Addendum)
Subjective:  Seen on Hd  Tolerating , foot discomfort  Improved.  Objective Vital signs in last 24 hours: Vitals:   03/17/20 0900 03/17/20 0935 03/17/20 0941 03/17/20 1000  BP:  (!) 105/59 (!) 121/52 (!) 108/46  Pulse: 78 73 73 70  Resp: 16     Temp:  98 F (36.7 C)    TempSrc:  Oral    SpO2: 100% 100%  97%  Weight:  69.4 kg    Height:       Weight change:   General: elderly  pleasant female in NAD Lungs: CTA bilaterally.. Breathing  unlabored on 2L via [place] Heart: RRR. No murmur, rubs or gallops.  Abdomen: soft, nontender, +BS, no guarding, no rebound tenderness Lower extremities:trace edema on L, L foot dressed, incision on L leg clean, dry intact, R toes dressed, no RLE edema.. Dialysis Access:RU AVF patent on hd   OP Dialysis Orders:  TTS - [facility]  3hrs 36min, BFR 350, DFR 800,  EDW 70kg, 2K/ 2Ca, P2 Access: RU AVF   Heparin 2500 Mircera 30 mcg q4wks - last 11/4 Hectorol 77mcg IV qHD  Problem/Plan: 1.  Non healing ulcer R foot/critical limb ischemia - s/p  Dr. Donnetta Hutching R external iliac & common fem endarterectomy w/darcon patch angioplasty and R fem to anterior tibial bypass  12/10 .  Plan Per [facility] 2.  ESRD -  On HD TTS. On Schedule,  admit K 3.4 - K supplement given, pre hd 3.8 use ^K bath , Hold heparin d/t surger. 3.  Hypertension/volume  - BP variable.  Continue home meds. Does not appear grossly volume overloaded. UF as tolerated.   4.  Anemia of CKD - Hgb 7.1 post procedure.  1 unit pRBC ordered// 12/11 hgb 8.2 .start Aranesp  60 mcg with HD12/11.  iron labs pending  5.  Secondary Hyperparathyroidism -  Ca and phos in goal .  Continue VDRA and binders+ 3 renvela  6.  Nutrition - Renal diet w/ fluid restrictions.  7. Hx CAD 8. Systolic/diastolic HF   Ernest Haber, PA-C [facility] [phone] 03/17/2020,11:31 AM  LOS: 1 day   Labs: Basic Metabolic Panel: Recent Labs  Lab 03/14/20 1421 03/16/20 0603 03/16/20 1237 03/16/20 1531 03/16/20 2333  03/17/20 0943  NA 136   < > 139  --  140 136  K 3.8   < > 3.4*  --  4.0 3.8  CL 93*   < > 100  --  99 98  CO2 30  --   --   --  30 26  GLUCOSE 92   < > 112*  --  203* 225*  BUN 37*   < > 26*  --  34* 39*  CREATININE 3.71*   < > 3.00* 3.52* 3.96* 4.26*  CALCIUM 9.0  --   --   --  7.7* 7.9*  PHOS  --   --   --   --   --  5.7*   < > = values in this interval not displayed.   Liver Function Tests: Recent Labs  Lab 03/14/20 1421 03/17/20 0943  AST 20  --   ALT 17  --   ALKPHOS 182*  --   BILITOT 0.8  --   PROT 6.5  --   ALBUMIN 3.6 3.0*   No results for input(s): LIPASE, AMYLASE in the last 168 hours. No results for input(s): AMMONIA in the last 168 hours. CBC: Recent Labs  Lab 03/14/20 1421 03/16/20  [id number] 03/16/20 1531 03/16/20 2333 03/17/20 0943  WBC 7.8  --  7.7 6.8 7.8  HGB 12.5   < > 8.2* 7.5* 8.2*  HCT 36.6   < > 24.1* 21.6* 25.7*  MCV 97.9  --  96.0 96.0 98.5  PLT 151  --  113* 118* 143*   < > = values in this interval not displayed.   Cardiac Enzymes: No results for input(s): CKTOTAL, CKMB, CKMBINDEX, TROPONINI in the last 168 hours. CBG: Recent Labs  Lab 03/14/20 1422 03/16/20 0609 03/16/20 1157 03/16/20 2222 03/17/20 0638  GLUCAP 98 93 108* 205* 166*    Studies/Results: No results found. Medications: . sodium chloride    . sodium chloride    . magnesium sulfate bolus IVPB     . allopurinol  100 mg Oral BID  . atorvastatin  10 mg Oral Daily  . Chlorhexidine Gluconate Cloth  6 each Topical Q0600  . Darbepoetin Alfa      . darbepoetin (ARANESP) injection - DIALYSIS  60 mcg Intravenous Q Sat-HD  . docusate sodium  100 mg Oral BID  . doxercalciferol  3 mcg Intravenous Q T,Th,Sa-HD  . heparin  5,000 Units Subcutaneous Q8H  . hydrALAZINE  75 mg Oral TID  . insulin aspart  0-15 Units Subcutaneous TID WC  . isosorbide dinitrate  10 mg Oral TID  . pantoprazole  40 mg Oral Daily  . sevelamer carbonate  2,400 mg Oral TID WC  . traZODone  25 mg Oral  QHS    Nephrology attending: Seen and examined personally and I agree with assessment and plan as outlined above. Recovering well after surgery.  Plan for dialysis today.  Continue binders.  Katheran James, MD [facility].

## 2020-03-17 NOTE — Evaluation (Signed)
Physical Therapy Evaluation Patient Details Name: [name] MRN: [id number] DOB: 02-06-1946 Today's Date: 03/17/2020   History of Present Illness  74 yo female with R foot wound has been admitted for R femoral endarterectomy and R fem-pop bypass graft.  PMHx:  2020 L fem to below knee pop bypass with saphenous vein, toe amputations, breast CA, L shoulder dislocation, SOB, CKD, CAD, NSTEMI, OA, gout  Clinical Impression  Pt was seen for mobility on RW with significant time to progress to side of bed, to stand and to check BP.  Sitting BP was 98/48 and standing was 97/56.  Pt understands not to try to get up alone, nursing aware of her values of orthostasis.  Follow acutely for strengthening and balance training to get pt standing and walking safer to work toward home.    Follow Up Recommendations SNF    Equipment Recommendations  Rolling walker with 5" wheels    Recommendations for Other Services       Precautions / Restrictions Precautions Precautions: Fall Precaution Comments: telemetry Required Braces or Orthoses: Splint/Cast (darco shoe R foot) Restrictions Weight Bearing Restrictions: Yes RLE Weight Bearing:  (heel wb only)  Monitor BP for orthostatics      Mobility  Bed Mobility Overal bed mobility: Needs Assistance Bed Mobility: Supine to Sit;Sit to Supine     Supine to sit: Min assist Sit to supine: Mod assist   General bed mobility comments: more help to protect RLE to return to bed than exitign    Transfers Overall transfer level: Needs assistance Equipment used: Rolling walker (2 wheeled);2 person hand held assist Transfers: Sit to/from Stand Sit to Stand: Min assist;+2 physical assistance;+2 safety/equipment         General transfer comment: 2 person min assist to power up and steady, very good but cannot take more than a shifting step  Ambulation/Gait             General Gait Details: unable to walk away from bed  Stairs             Wheelchair Mobility    Modified Rankin (Stroke Patients Only)       Balance Overall balance assessment: Needs assistance Sitting-balance support: Feet supported Sitting balance-Leahy Scale: Fair     Standing balance support: Bilateral upper extremity supported;During functional activity Standing balance-Leahy Scale: Poor                               Pertinent Vitals/Pain Faces Pain Scale: Hurts little more Pain Location: RLE with mobility Pain Descriptors / Indicators: Operative site guarding;Guarding;Grimacing Pain Intervention(s): Limited activity within patient's tolerance;Monitored during session;Repositioned    Home Living Family/patient expects to be discharged to:: Private residence Living Arrangements: Spouse/significant other Available Help at Discharge: Family Type of Home: House Home Access: Stairs to enter Entrance Stairs-Rails: None Entrance Stairs-Number of Steps: 2 half steps to get into back of house (ramped) Home Layout: Laundry or work area in basement;Able to live on main level with bedroom/bathroom Home Equipment: Environmental consultant - 2 wheels;Cane - single point;Wheelchair - Dentist Comments: husband is helpful and house is set up    Prior Function Level of Independence: Independent with assistive device(s)         Comments: Independent with household ambulation, uses walking stick outside. Sponge bathes     Hand Dominance   Dominant Hand: Right    Extremity/Trunk Assessment  Communication   Communication: No difficulties  Cognition Arousal/Alertness: Awake/alert Behavior During Therapy: WFL for tasks assessed/performed Overall Cognitive Status: Within Functional Limits for tasks assessed                                        General Comments General comments (skin integrity, edema, etc.): pt is up to walk on side of bed with a couple steps but cannot walk on walker  away from bed, pain and weakness prevail    Exercises     Assessment/Plan    PT Assessment Patient needs continued PT services  PT Problem List Decreased strength;Decreased range of motion;Decreased activity tolerance;Decreased balance;Decreased mobility;Decreased coordination;Decreased knowledge of use of DME;Decreased safety awareness;Cardiopulmonary status limiting activity;Decreased skin integrity;Pain       PT Treatment Interventions DME instruction;Gait training;Functional mobility training;Stair training;Therapeutic exercise;Therapeutic activities;Balance training;Neuromuscular re-education;Patient/family education    PT Goals (Current goals can be found in the Care Plan section)  Acute Rehab PT Goals Patient Stated Goal: to recover independent and safe gait PT Goal Formulation: With patient Time For Goal Achievement: 03/31/20 Potential to Achieve Goals: Good    Frequency Min 3X/week   Barriers to discharge   home with help of husband    Co-evaluation               AM-PAC PT "6 Clicks" Mobility  Outcome Measure Help needed turning from your back to your side while in a flat bed without using bedrails?: A Little Help needed moving from lying on your back to sitting on the side of a flat bed without using bedrails?: A Lot Help needed moving to and from a bed to a chair (including a wheelchair)?: A Lot Help needed standing up from a chair using your arms (e.g., wheelchair or bedside chair)?: A Lot Help needed to walk in hospital room?: A Lot Help needed climbing 3-5 steps with a railing? : Total 6 Click Score: 12    End of Session Equipment Utilized During Treatment: Gait belt Activity Tolerance: Patient limited by fatigue;Treatment limited secondary to medical complications (Comment) Patient left: in bed;with call bell/phone within reach;with bed alarm set Nurse Communication: Mobility status PT Visit Diagnosis: Unsteadiness on feet (R26.81);Muscle weakness  (generalized) (M62.81);Pain Pain - Right/Left: Right Pain - part of body: Ankle and joints of foot    Time: 5625-6389 PT Time Calculation (min) (ACUTE ONLY): 41 min   Charges:   PT Evaluation $PT Eval Moderate Complexity: 1 Mod PT Treatments $Therapeutic Activity: 23-37 mins       Ramond Dial 03/17/2020, 5:21 PM  Mee Hives, PT MS Acute Rehab Dept. Number: [place] and [place]

## 2020-03-17 NOTE — Progress Notes (Signed)
PHARMACIST LIPID MONITORING   [name] is a 73 y.o. female admitted on 03/16/2020 with non-healing ulcers.  Pharmacy has been consulted to optimize lipid-lowering therapy with the indication of secondary prevention for clinical ASCVD.  Recent Labs:  Lipid Panel (last 6 months):   Lab Results  Component Value Date   CHOL 54 03/16/2020   TRIG 47 03/16/2020   HDL 24 (L) 03/16/2020   CHOLHDL 2.3 03/16/2020   VLDL 9 03/16/2020   LDLCALC 21 03/16/2020    Hepatic function panel (last 6 months):   Lab Results  Component Value Date   AST 20 03/14/2020   ALT 17 03/14/2020   ALKPHOS 182 (H) 03/14/2020   BILITOT 0.8 03/14/2020    SCr (since admission):   Serum creatinine: 4.26 mg/dL (H) 03/17/20 0943 Estimated creatinine clearance: 11.1 mL/min (A)  Current lipid-lowering therapy: Atorvastatin 10mg  qday Previous lipid-lowering therapies (if applicable):  Documented or reported allergies or intolerances to lipid-lowering therapies (if applicable):   Assessment:  Patient is excluded from the protocol due to ESRD (ESRD, elevated LFTs, pregnancy/breastfeeding, active liver disease)  Recommendation per protocol:  Continue current lipid-lowering therapy.  Follow-up with:  Primary care provider - Unk Pinto, MD  Follow-up labs after discharge:    Liver function panel and lipid panel in 8-12 weeks then annually  Plan: Continue atorvastatin 10mg  qday  Onnie Boer, PharmD, [place], AAHIVP, CPP Infectious Disease Pharmacist 03/17/2020 12:28 PM

## 2020-03-17 NOTE — Progress Notes (Signed)
   03/17/20 1244  Vitals  Temp 98.6 F (37 C)  Temp Source Oral  BP (!) 96/50  MAP (mmHg) (!) 64  BP Location Left Arm  BP Method Automatic  Patient Position (if appropriate) Lying  Pulse Rate 67  Pulse Rate Source Monitor  ECG Heart Rate 68  Resp 19  Oxygen Therapy  SpO2 97 %  O2 Device Room Air  Post-Hemodialysis Assessment  Rinseback Volume (mL) 250 mL  KECN 181 V  Dialyzer Clearance Lightly streaked  Duration of HD Treatment -hour(s) 3 hour(s)  Hemodialysis Intake (mL) 500 mL  UF Total -Machine (mL) 2441 mL  Net UF (mL) 1941 mL  Tolerated HD Treatment Yes  Post-Hemodialysis Comments tx complete-pt stable  AVG/AVF Arterial Site Held (minutes) 10 minutes  AVG/AVF Venous Site Held (minutes) 10 minutes  Fistula / Graft Right Forearm Arteriovenous fistula  No Placement Date or Time found.   Placed prior to admission: Yes  Orientation: Right  Access Location: Forearm  Access Type: Arteriovenous fistula  Site Condition No complications  Fistula / Graft Assessment Present;Thrill;Bruit  Status Deaccessed

## 2020-03-17 NOTE — Progress Notes (Addendum)
  Progress Note    03/17/2020 8:13 AM 1 Day Post-Op  Subjective:  No complaints this morning; minimal pain   Vitals:   03/16/20 1600 03/16/20 2100  BP: (!) 121/54 (!) 96/47  Pulse: 81 77  Resp: 16 20  Temp: 97.9 F (36.6 C) 98.3 F (36.8 C)  SpO2: 100% 95%   Physical Exam: Lungs:  Non labored Incisions:  R groin incision c/d/i; R popliteal incision c/d/i Extremities:  R foot warm with good cap refill Neurologic: A&O  CBC    Component Value Date/Time   WBC 6.8 03/16/2020 2333   RBC 2.25 (L) 03/16/2020 2333   HGB 7.5 (L) 03/16/2020 2333   HGB 11.7 05/18/2017 1218   HGB 12.3 02/02/2017 1136   HCT 21.6 (L) 03/16/2020 2333   HCT 34.7 05/18/2017 1218   HCT 37.4 02/02/2017 1136   PLT 118 (L) 03/16/2020 2333   PLT 227 05/18/2017 1218   MCV 96.0 03/16/2020 2333   MCV 87 05/18/2017 1218   MCV 90.6 02/02/2017 1136   MCH 33.3 03/16/2020 2333   MCHC 34.7 03/16/2020 2333   RDW 17.0 (H) 03/16/2020 2333   RDW 16.0 (H) 05/18/2017 1218   RDW 15.7 (H) 02/02/2017 1136   LYMPHSABS 1,278 02/13/2020 1518   LYMPHSABS 1.8 02/02/2017 1136   MONOABS 0.8 04/18/2019 0631   MONOABS 0.8 02/02/2017 1136   EOSABS 80 02/13/2020 1518   EOSABS 0.3 02/02/2017 1136   BASOSABS 22 02/13/2020 1518   BASOSABS 0.1 02/02/2017 1136    BMET    Component Value Date/Time   NA 140 03/16/2020 2333   NA 143 05/18/2017 1218   NA 143 02/02/2017 1137   K 4.0 03/16/2020 2333   K 3.8 02/02/2017 1137   CL 99 03/16/2020 2333   CO2 30 03/16/2020 2333   CO2 24 02/02/2017 1137   GLUCOSE 203 (H) 03/16/2020 2333   GLUCOSE 74 02/02/2017 1137   BUN 34 (H) 03/16/2020 2333   BUN 76 (HH) 05/18/2017 1218   BUN 63.4 (H) 02/02/2017 1137   CREATININE 3.96 (H) 03/16/2020 2333   CREATININE 5.12 (H) 02/13/2020 1518   CREATININE 3.0 (HH) 02/02/2017 1137   CALCIUM 7.7 (L) 03/16/2020 2333   CALCIUM 9.9 02/02/2017 1137   GFRNONAA 11 (L) 03/16/2020 2333   GFRNONAA 8 (L) 02/13/2020 1518   GFRAA 9 (L) 02/13/2020 1518     INR    Component Value Date/Time   INR 1.2 03/14/2020 1421     Intake/Output Summary (Last 24 hours) at 03/17/2020 0813 Last data filed at 03/16/2020 2100 Gross per 24 hour  Intake 1755 ml  Output 1000 ml  Net 755 ml     Assessment/Plan:  74 y.o. female is s/p R CFA endarterectomy with femoral to ATA bypass with PTFE 1 Day Post-Op   R foot well perfused; check post op ABI Encouraged offloading R heel HD today per Nephrology Probably d/c early next week   Dagoberto Ligas, PA-C [facility] [phone] 03/17/2020 8:13 AM  VASCULAR STAFF ADDENDUM: I have independently interviewed and examined the patient. I agree with the above.   Yevonne Aline. Stanford Breed, MD [facility] Phone Number: [phone] 03/17/2020 4:05 PM

## 2020-03-18 LAB — GLUCOSE, CAPILLARY
Glucose-Capillary: 94 mg/dL (ref 70–99)
Glucose-Capillary: 126 mg/dL — ABNORMAL HIGH (ref 70–99)
Glucose-Capillary: 130 mg/dL — ABNORMAL HIGH (ref 70–99)
Glucose-Capillary: 127 mg/dL — ABNORMAL HIGH (ref 70–99)

## 2020-03-18 LAB — RENAL FUNCTION PANEL
Albumin: 2.7 g/dL — ABNORMAL LOW (ref 3.5–5.0)
Anion gap: 13 (ref 5–15)
BUN: 38 mg/dL — ABNORMAL HIGH (ref 8–23)
CO2: 26 mmol/L (ref 22–32)
Calcium: 8.5 mg/dL — ABNORMAL LOW (ref 8.9–10.3)
Chloride: 96 mmol/L — ABNORMAL LOW (ref 98–111)
Creatinine, Ser: 4.65 mg/dL — ABNORMAL HIGH (ref 0.44–1.00)
GFR, Estimated: 9 mL/min — ABNORMAL LOW (ref >60)
Glucose, Bld: 125 mg/dL — ABNORMAL HIGH (ref 70–99)
Phosphorus: 5.2 mg/dL — ABNORMAL HIGH (ref 2.5–4.6)
Potassium: 4 mmol/L (ref 3.5–5.1)
Sodium: 135 mmol/L (ref 135–145)

## 2020-03-18 LAB — IRON AND TIBC
Iron: 52 ug/dL (ref 28–170)
Saturation Ratios: 24 % (ref 10.4–31.8)
TIBC: 217 ug/dL — ABNORMAL LOW (ref 250–450)
UIBC: 165 ug/dL

## 2020-03-18 NOTE — Progress Notes (Signed)
[facility] Progress Note   Subjective:   Patient seen and examined at bedside.  Pain well controlled currently.  Reports she slept well last night.  Denies CP, SOB, n/v/d, abdominal pain, weakness, dizziness and fatigue.   Objective Vitals:   03/17/20 2135 03/17/20 2329 03/18/20 0353 03/18/20 0833  BP: (!) 112/46 (!) 105/42 (!) 119/48 (!) 122/50  Pulse:  73 70 70  Resp:  18 20 18   Temp:  97.7 F (36.5 C) 98 F (36.7 C) 97.6 F (36.4 C)  TempSrc:  Oral Oral Oral  SpO2:  100% 100% 100%  Weight:      Height:       Physical Exam General:WDWN female in NAD Heart:RRR, no MRG Lungs:CTAB anterolaterally, nml WOB on 2L via [place] Abdomen:soft, NTND Extremities:L foot dressed, incision on L leg c/d/i, no edema b/l Dialysis Access: RU AVF +b/t   Filed Weights   03/16/20 0604 03/17/20 0935 03/17/20 1254  Weight: 69.9 kg 69.4 kg 66.8 kg    Intake/Output Summary (Last 24 hours) at 03/18/2020 0944 Last data filed at 03/17/2020 2330 Gross per 24 hour  Intake 480 ml  Output 1941 ml  Net -1461 ml    Additional Objective Labs: Basic Metabolic Panel: Recent Labs  Lab 03/14/20 1421 03/16/20 0603 03/16/20 1237 03/16/20 1531 03/16/20 2333 03/17/20 0943  NA 136   < > 139  --  140 136  K 3.8   < > 3.4*  --  4.0 3.8  CL 93*   < > 100  --  99 98  CO2 30  --   --   --  30 26  GLUCOSE 92   < > 112*  --  203* 225*  BUN 37*   < > 26*  --  34* 39*  CREATININE 3.71*   < > 3.00* 3.52* 3.96* 4.26*  CALCIUM 9.0  --   --   --  7.7* 7.9*  PHOS  --   --   --   --   --  5.7*   < > = values in this interval not displayed.   Liver Function Tests: Recent Labs  Lab 03/14/20 1421 03/17/20 0943  AST 20  --   ALT 17  --   ALKPHOS 182*  --   BILITOT 0.8  --   PROT 6.5  --   ALBUMIN 3.6 3.0*   CBC: Recent Labs  Lab 03/14/20 1421 03/16/20 0603 03/16/20 1531 03/16/20 2333 03/17/20 0943  WBC 7.8  --  7.7 6.8 7.8  HGB 12.5   < > 8.2* 7.5* 8.2*  HCT 36.6   < > 24.1* 21.6*  25.7*  MCV 97.9  --  96.0 96.0 98.5  PLT 151  --  113* 118* 143*   < > = values in this interval not displayed.   Blood Culture    Component Value Date/Time   SDES BLOOD BLOOD LEFT FOREARM 03/23/2019 1538   SPECREQUEST  03/23/2019 1538    BOTTLES DRAWN AEROBIC AND ANAEROBIC Blood Culture results may not be optimal due to an excessive volume of blood received in culture bottles   CULT  03/23/2019 1538    NO GROWTH 5 DAYS Performed at [facility], [place] [address], [place], [place] [address]    REPTSTATUS 03/28/2019 FINAL 03/23/2019 1538   CBG: Recent Labs  Lab 03/17/20 0638 03/17/20 1329 03/17/20 1649 03/17/20 2125 03/18/20 0609  GLUCAP 166* 149* 157* 121* 94    Medications: . magnesium  sulfate bolus IVPB     . allopurinol  100 mg Oral BID  . atorvastatin  10 mg Oral Daily  . Chlorhexidine Gluconate Cloth  6 each Topical Q0600  . darbepoetin (ARANESP) injection - DIALYSIS  60 mcg Intravenous Q Sat-HD  . docusate sodium  100 mg Oral BID  . doxercalciferol  3 mcg Intravenous Q T,Th,Sa-HD  . heparin  5,000 Units Subcutaneous Q8H  . hydrALAZINE  75 mg Oral TID  . insulin aspart  0-15 Units Subcutaneous TID WC  . isosorbide dinitrate  10 mg Oral TID  . pantoprazole  40 mg Oral Daily  . sevelamer carbonate  2,400 mg Oral TID WC  . traZODone  25 mg Oral QHS    Dialysis Orders: TTS -NW 3hrs81min, BFR350, IDC301, EDW 70kg,2K/2Ca, P2 Access:RU AVF Heparin2500 Mircera21mcg q4wks - last11/4 Hectorol9mcg IV qHD  Problem/Plan: 1. Non healing ulcer R foot/critical limb ischemia- s/p  Dr. Donnetta Hutching R external iliac &common fem endarterectomy w/darcon patch angioplasty and R fem to anterior tibial bypass 12/10 . Plan Per [facility] 2. ESRD- On HD TTS. HD yesterday tolerated well. Last K 3.8. Hold heparin d/t surgery.  Next HD 12/14. 3. Hypertension/volume- BP mostly well controlled. Continue home meds. Does not appear grossly volume overloaded. UF as  tolerated.  4. Anemiaof CKD- Hgb 7.1 post surgery, improved to 8.2 s/p1 unit pRBC on 12/10. Cont Aranesp 60 mcg qSat. Iron labs ordered.  5. Secondary Hyperparathyroidism -Ca and phos in goal close to goal. Continue VDRA and binders - 3 renvela  6. Nutrition- Renal diet w/ fluid restrictions.  7. Hx CAD 8. Systolic/diastolic HF   Jen Mow, PA-C [facility] 03/18/2020,9:44 AM  LOS: 2 days

## 2020-03-18 NOTE — Progress Notes (Addendum)
Progress Note    03/18/2020 8:54 AM 2 Days Post-Op  Subjective:  No complaints related to surgery   Vitals:   03/18/20 0353 03/18/20 0833  BP: (!) 119/48 (!) 122/50  Pulse: 70 70  Resp: 20 18  Temp: 98 F (36.7 C) 97.6 F (36.4 C)  SpO2: 100% 100%   Physical Exam: Lungs:  Non labored Incisions:  R groin and popliteal incision c/d/i Extremities:  R PT and ATA signal by doppler Neurologic: A&O  CBC    Component Value Date/Time   WBC 7.8 03/17/2020 0943   RBC 2.61 (L) 03/17/2020 0943   HGB 8.2 (L) 03/17/2020 0943   HGB 11.7 05/18/2017 1218   HGB 12.3 02/02/2017 1136   HCT 25.7 (L) 03/17/2020 0943   HCT 34.7 05/18/2017 1218   HCT 37.4 02/02/2017 1136   PLT 143 (L) 03/17/2020 0943   PLT 227 05/18/2017 1218   MCV 98.5 03/17/2020 0943   MCV 87 05/18/2017 1218   MCV 90.6 02/02/2017 1136   MCH 31.4 03/17/2020 0943   MCHC 31.9 03/17/2020 0943   RDW 17.0 (H) 03/17/2020 0943   RDW 16.0 (H) 05/18/2017 1218   RDW 15.7 (H) 02/02/2017 1136   LYMPHSABS 1,278 02/13/2020 1518   LYMPHSABS 1.8 02/02/2017 1136   MONOABS 0.8 04/18/2019 0631   MONOABS 0.8 02/02/2017 1136   EOSABS 80 02/13/2020 1518   EOSABS 0.3 02/02/2017 1136   BASOSABS 22 02/13/2020 1518   BASOSABS 0.1 02/02/2017 1136    BMET    Component Value Date/Time   NA 136 03/17/2020 0943   NA 143 05/18/2017 1218   NA 143 02/02/2017 1137   K 3.8 03/17/2020 0943   K 3.8 02/02/2017 1137   CL 98 03/17/2020 0943   CO2 26 03/17/2020 0943   CO2 24 02/02/2017 1137   GLUCOSE 225 (H) 03/17/2020 0943   GLUCOSE 74 02/02/2017 1137   BUN 39 (H) 03/17/2020 0943   BUN 76 (HH) 05/18/2017 1218   BUN 63.4 (H) 02/02/2017 1137   CREATININE 4.26 (H) 03/17/2020 0943   CREATININE 5.12 (H) 02/13/2020 1518   CREATININE 3.0 (HH) 02/02/2017 1137   CALCIUM 7.9 (L) 03/17/2020 0943   CALCIUM 9.9 02/02/2017 1137   GFRNONAA 10 (L) 03/17/2020 0943   GFRNONAA 8 (L) 02/13/2020 1518   GFRAA 9 (L) 02/13/2020 1518    INR     Component Value Date/Time   INR 1.2 03/14/2020 1421     Intake/Output Summary (Last 24 hours) at 03/18/2020 0854 Last data filed at 03/17/2020 2330 Gross per 24 hour  Intake 720 ml  Output 1941 ml  Net -1221 ml     Assessment/Plan:  74 y.o. female is s/p R CFA endarterectomy and femoral to ATA bypass with PTFE 2 Days Post-Op   RLE well perfused with brisk ATA and PTA signals Dry dressing changes R groin and R foot PT recommending SNF; can go [facility] PT if mobility improves   Dagoberto Ligas, PA-C [facility] [phone] 03/18/2020 8:54 AM  VASCULAR STAFF ADDENDUM: I have independently interviewed and examined the patient. I agree with the above.  Making great progress. Needs to mobilize. PT / OT / OOB / Ambulate. HD per nephrology. Local care to the groin - daily and PRN dressing changes. Multiple questions about narcotic pain medicine and constipation for me and PA Eveland today. Suggested she follow up with a pain specialist. Anticipate discharge in next 48 hours.  Yevonne Aline. Stanford Breed, MD [facility]  of [facility] Phone Number: [phone] 03/18/2020 11:05 AM

## 2020-03-18 NOTE — Progress Notes (Signed)
Mobility Specialist - Progress Note   03/18/20 1503  Mobility  Activity Ambulated in room  Level of Assistance Minimal assist, patient does 75% or more  Assistive Device Front wheel walker  Distance Ambulated (ft) 10 ft  Mobility Response Tolerated well  Mobility performed by Mobility specialist  $Mobility charge 1 Mobility    Pre-mobility: 78 HR, 106/48 BP, 97% SpO2 Post-mobility: 81 HR, 130/32 BP, 98% SpO2  Pt min assist to stand and contact guard for safety while ambulating. Asx throughout. Pt back in recliner after walk. RN notified of BP.   Pricilla Handler Mobility Specialist Mobility Specialist Phone: [phone]

## 2020-03-18 NOTE — Evaluation (Signed)
Occupational Therapy Evaluation Patient Details Name: [name] MRN: [id number] DOB: 06-12-1945 Today's Date: 03/18/2020    History of Present Illness 74 yo female with R foot wound has been admitted for R femoral endarterectomy and R fem-pop bypass graft.  PMHx:  2020 L fem to below knee pop bypass with saphenous vein, toe amputations, breast CA, L shoulder dislocation, SOB, CKD, CAD, NSTEMI, OA, gout   Clinical Impression   Patient is s/p R fem-pop bypass graft surgery resulting in functional limitations due to the deficits listed below (see OT problem list). Pt with BP 122/54 supine and progressed to 114/91 with movement. Pt does have some nausea initially but subsides with rest. Pt transferred L side of bed around to sink level, Stand pivot to bed due to nausea, then log roll to L side of bed for stand pivot to chair. Pt eating breakfast and pleased with session. Pt will require Min-mod (A) for basic transfers at d/c Patient will benefit from skilled OT acutely to increase independence and safety with ADLS to allow discharge [place]. If family is unable to provide (A) will need SNF for d/c planning.      Follow Up Recommendations  Home health OT    Equipment Recommendations  Other (comment);3 in 1 bedside commode (RW)    Recommendations for Other Services       Precautions / Restrictions Precautions Precautions: Fall Precaution Comments: watch BP closely Required Braces or Orthoses: Other Brace Other Brace: darco shoe R LE Restrictions Weight Bearing Restrictions: Yes RLE Weight Bearing:  (MD progress notes reports off set weight R heel)      Mobility Bed Mobility Overal bed mobility: Needs Assistance Bed Mobility: Rolling;Supine to Sit;Sit to Supine Rolling: Min guard   Supine to sit: Min assist Sit to supine: Mod assist   General bed mobility comments: pt requires (A) increased time to progress to eob. pt able to with bed rails progress to static sitting with  trunk elevation (A). pt requires (A) for R LE back onto bed surface.    Transfers Overall transfer level: Needs assistance Equipment used: Rolling walker (2 wheeled) Transfers: Sit to/from Stand Sit to Stand: Min assist;From elevated surface         General transfer comment: pt able to power up from surface with counting rocking momentum 1-2-3. pt needs increased time in static standing prior to progressing foward with RW    Balance Overall balance assessment: Needs assistance Sitting-balance support: Bilateral upper extremity supported;Feet supported Sitting balance-Leahy Scale: Good     Standing balance support: Bilateral upper extremity supported;During functional activity Standing balance-Leahy Scale: Poor                             ADL either performed or assessed with clinical judgement   ADL Overall ADL's : Needs assistance/impaired Eating/Feeding: Set up Eating/Feeding Details (indicate cue type and reason): needs (A) opening containers Grooming: Oral care;Set up Grooming Details (indicate cue type and reason): sitting pt progressed to sitting in chair reports nausea and dizziness so transfered back to eob to ensure BP was not dropping and safe positioning. pt with BP stable so resumed sitting eob             Lower Body Dressing: Minimal assistance;Sit to/from stand Lower Body Dressing Details (indicate cue type and reason): pt needs (A) to put on R LE as she can not completely bed to reach shoe or figure 4 cross.  pt is very close to figure 4 crossing. pt reports spouse can (A) Toilet Transfer: Minimal assistance;RW;Comfort height toilet Toilet Transfer Details (indicate cue type and reason): simulated EOB with RW         Functional mobility during ADLs: Minimal assistance;Rolling walker       Vision Patient Visual Report: No change from baseline       Perception     Praxis      Pertinent Vitals/Pain Pain Assessment: Faces Pain Score:  0-No pain Pain Location: states "i dont have any right now but boy it can come on fast. i have made the decision to not take pain medication"     Hand Dominance Right   Extremity/Trunk Assessment Upper Extremity Assessment Upper Extremity Assessment: Overall WFL for tasks assessed   Lower Extremity Assessment Lower Extremity Assessment: Defer to PT evaluation   Cervical / Trunk Assessment Cervical / Trunk Assessment: Kyphotic   Communication Communication Communication: No difficulties   Cognition Arousal/Alertness: Awake/alert Behavior During Therapy: WFL for tasks assessed/performed Overall Cognitive Status: No family/caregiver present to determine baseline cognitive functioning                                 General Comments: pt able to recall therapist name during session however demonstrates multiple times during session difficutly with sequencing task.   General Comments  incision dry and intact. RN at bedside and checking as morning assessment    Exercises     Shoulder Instructions      Home Living Family/patient expects to be discharged to:: Private residence Living Arrangements: Spouse/significant other Available Help at Discharge: Family Type of Home: House Home Access: Stairs to enter [employer] of Steps: 2 half steps to get into back of house Entrance Stairs-Rails: None Home Layout: Laundry or work area in basement;Able to live on main level with bedroom/bathroom     Bathroom Shower/Tub: Teacher, early years/pre: Handicapped height Bathroom Accessibility: Yes   Home Equipment: Environmental consultant - 2 wheels;Cane - single point;Wheelchair - Education officer, museum Comments: husband is helpful and house is set up      Prior Functioning/Environment Level of Independence: Independent with assistive device(s)        Comments: Independent with household ambulation, uses walking stick outside. Sponge bathes         OT Problem List: Decreased strength;Decreased activity tolerance;Impaired balance (sitting and/or standing);Decreased safety awareness;Decreased knowledge of use of DME or AE;Cardiopulmonary status limiting activity;Decreased knowledge of precautions;Pain      OT Treatment/Interventions: Self-care/ADL training;Therapeutic exercise;Neuromuscular education;DME and/or AE instruction;Energy conservation;Manual therapy;Therapeutic activities;Cognitive remediation/compensation;Patient/family education;Balance training    OT Goals(Current goals can be found in the care plan section) Acute Rehab OT Goals Patient Stated Goal: to visit with son today that is going back OT Goal Formulation: With patient Time For Goal Achievement: 04/01/20 Potential to Achieve Goals: Good  OT Frequency: Min 2X/week   Barriers to D/C: Decreased caregiver support  reports spouse does not (A) with dressing changes or medication (shots).       Co-evaluation              AM-PAC OT "6 Clicks" Daily Activity     Outcome Measure Help from another person eating meals?: A Little Help from another person taking care of personal grooming?: A Little Help from another person toileting, which includes using toliet, bedpan, or urinal?: A Little Help from another person bathing (  including washing, rinsing, drying)?: A Little Help from another person to put on and taking off regular upper body clothing?: A Little Help from another person to put on and taking off regular lower body clothing?: A Little 6 Click Score: 18   End of Session Equipment Utilized During Treatment: Rolling walker Nurse Communication: Mobility status;Precautions  Activity Tolerance: Patient tolerated treatment well Patient left: in chair;with call bell/phone within reach;with chair alarm set  OT Visit Diagnosis: Unsteadiness on feet (R26.81);Muscle weakness (generalized) (M62.81)                Time: 4799-8721 OT Time Calculation (min): 46  min Charges:  OT General Charges $OT Visit: 1 Visit OT Evaluation $OT Eval Moderate Complexity: 1 Mod OT Treatments $Self Care/Home Management : 23-37 mins   Brynn, OTR/L  Acute Rehabilitation Services Pager: [phone] Office: [phone] .   Jeri Modena 03/18/2020, 10:20 AM

## 2020-03-18 NOTE — Progress Notes (Addendum)
Pt increase in frequent PVC and QTC seqment is prolonged from this AM. PA notified will continue to monitor.  Phoebe Sharps, RN

## 2020-03-18 NOTE — TOC Initial Note (Signed)
Transition of Care [facility]) - Initial/Assessment Note    Patient Details  Name: [name] MRN: [id number] Date of Birth: 1945/08/20  Transition of [facility]) CM/SW Contact:    Vinie Sill, [place] Phone Number: 03/18/2020, 12:14 PM  Clinical Narrative:                  CSW visit with patient. CSW introduced self and explained role. CSW discussed PT recommendation of short term rehab at [facility]. Patient states her preference is to go home. She hopes to continue to improve with PT , so she can go home, however, she was agreeable to SNF work up as back up plan.  Patient states her spouse is there to assist with her care and her  son,Brent is here from [place](arrived before her procedure)- he is leaving but is returning with his family for the holidays and she does not want to be in rehab. Patient states she has life alert bracelet that will call her emergency contacts or 911 if they do not respond. She informed  CSW, she is already set up with [facility]. She receives dialysis at [facility] @3 :45pm- her husband provides transportation. She has received covid vaccines.   CSW will continue to follow and assist with discharge planning.  Thurmond Butts, MSW, LCSWA Clinical Social Worker     Expected Discharge Plan: [place] Barriers to Discharge: Continued Medical Work up   Patient Goals and CMS Choice        Expected Discharge Plan and Services Expected Discharge Plan: [place] In-house Referral: Clinical Social Work                                            Prior Living Arrangements/Services   Lives with:: Self,Spouse Patient language and need for interpreter reviewed:: No        Need for Family Participation in Patient Care: Yes (Comment) Care giver support system in place?: Yes (comment)   Criminal Activity/Legal Involvement Pertinent to Current Situation/Hospitalization: No - Comment as  needed  Activities of Daily Living      Permission Sought/Granted Permission sought to share information with : Family Supports Permission granted to share information with : Yes, Verbal Permission Granted  Share Information with NAME: Joud Pettinato  Permission granted to share info w AGENCY: SNFs  Permission granted to share info w Relationship: spouse  Permission granted to share info w Contact Information: [phone]  Emotional Assessment Appearance:: Appears stated age Attitude/Demeanor/Rapport: Engaged,Self-Confident Affect (typically observed): Accepting,Appropriate,Hopeful Orientation: : Oriented to Self,Oriented to Place,Oriented to  Time,Oriented to Situation Alcohol / Substance Use: Not Applicable Psych Involvement: No (comment)  Admission diagnosis:  Critical lower limb ischemia ([place]) [I70.229] Patient Active Problem List   Diagnosis Date Noted  . Critical lower limb ischemia ([place]) 03/16/2020  . Left toe amputee ([place]) 02/13/2020  . Abnormal EKG 02/13/2020  . Diabetic retinopathy of both eyes associated with type 2 diabetes mellitus ([place]) 02/10/2020  . Headache, unspecified 08/02/2019  . Neuropathic pain   . Labile blood glucose   . Debility 04/07/2019  . 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 S/P femoral-popliteal bypass surgery 04/07/2019  . Chronic bilateral low back pain without sciatica   . Abscess of right foot   . Cellulitis of unspecified  part of limb 01/27/2019  . Hyperlipidemia associated with type 2 diabetes mellitus ([place]) 01/09/2019  . Diabetes mellitus due to underlying condition with chronic kidney disease on chronic dialysis, with long-term current use of insulin ([place]) 01/09/2019  . Mild protein-calorie malnutrition ([place]) 09/06/2018  . Anemia in chronic kidney disease 09/03/2018  . Coagulation defect, unspecified ([place]) 09/03/2018  . Iron deficiency anemia, unspecified 09/03/2018  . Pain, unspecified  09/03/2018  . Secondary hyperparathyroidism of renal origin ([place]) 09/03/2018  . End stage renal disease ([place]) 05/24/2018  . ESRD on hemodialysis ([place])   . Generalized weakness   . Mitral valve mass   . Coronary artery disease involving coronary bypass graft of native heart without angina pectoris   . Anemia 05/03/2017  . Diabetes mellitus type 2, insulin dependent ([place]) 05/08/2015  . Overweight (BMI 25.0-29.9) 02/14/2015  . Medication management 08/04/2014  . Diabetic neuropathy ([place]) 02/17/2014  . Gouty arthritis   . Vitamin D deficiency   . Malignant neoplasm of upper-inner quadrant of left breast in female, estrogen receptor positive ([place]) 02/14/2013  . PVD (peripheral vascular disease) ([place]) 09/05/2009  . Essential hypertension 06/20/2009  . Congestive heart failure ([place]) 06/20/2009  . Asthma 06/20/2009   PCP:  Unk Pinto, MD Pharmacy:   [facility], [place] [address] Phone: [phone] Fax: [phone]  [facility] [address], [place] - [address] [place] [place] [address] Phone: [phone] Fax: [phone]  [facility] - [place], [place] - [address] [place], [place] [address] Phone: [phone] Fax: [phone]     Social Determinants of Health (SDOH) Interventions    Readmission Risk Interventions No flowsheet data found.

## 2020-03-18 NOTE — Progress Notes (Signed)
Dr.Hawkens Notified on increased PVC's and Prolonged QTC. Will continue to monitor pt. No new orders  Phoebe Sharps, RN

## 2020-03-18 NOTE — NC FL2 (Signed)
[place] LEVEL OF CARE SCREENING TOOL     IDENTIFICATION  Patient Name: [name] Birthdate: 10-10-1945 Sex: female Admission Date (Current Location): 03/16/2020  [facility] and [place] Number:  Herbalist and Address:  [facility], [place] [address] [place], [place] [address]      Provider Number: [id number]  Attending Physician Name and Address:  [name], MD  Relative Name and Phone Number:       Current Level of Care: Hospital Recommended Level of Care: [place] Prior Approval Number:    Date Approved/Denied:   PASRR Number: [id number]  Discharge Plan: SNF    Current Diagnoses: Patient Active Problem List   Diagnosis Date Noted  . Critical lower limb ischemia ([place]) 03/16/2020  . Left toe amputee ([place]) 02/13/2020  . Abnormal EKG 02/13/2020  . Diabetic retinopathy of both eyes associated with type 2 diabetes mellitus ([place]) 02/10/2020  . Headache, unspecified 08/02/2019  . Neuropathic pain   . Labile blood glucose   . Debility 04/07/2019  . History of complete ray amputation of fifth toe of left foot ([place]) 04/07/2019  . S/P femoral-popliteal bypass surgery 04/07/2019  . Chronic bilateral low back pain without sciatica   . Abscess of right foot   . Cellulitis of unspecified part of limb 01/27/2019  . Hyperlipidemia associated with type 2 diabetes mellitus ([place]) 01/09/2019  . Diabetes mellitus due to underlying condition with chronic kidney disease on chronic dialysis, with long-term current use of insulin ([place]) 01/09/2019  . Mild protein-calorie malnutrition ([place]) 09/06/2018  . Anemia in chronic kidney disease 09/03/2018  . Coagulation defect, unspecified ([place]) 09/03/2018  . Iron deficiency anemia, unspecified 09/03/2018  . Pain, unspecified 09/03/2018  . Secondary hyperparathyroidism of renal origin ([place]) 09/03/2018  . End stage renal disease ([place]) 05/24/2018  . ESRD on hemodialysis ([place])    . Generalized weakness   . Mitral valve mass   . Coronary artery disease involving coronary bypass graft of native heart without angina pectoris   . Anemia 05/03/2017  . Diabetes mellitus type 2, insulin dependent ([place]) 05/08/2015  . Overweight (BMI 25.0-29.9) 02/14/2015  . Medication management 08/04/2014  . Diabetic neuropathy ([place]) 02/17/2014  . Gouty arthritis   . Vitamin D deficiency   . Malignant neoplasm of upper-inner quadrant of left breast in female, estrogen receptor positive ([place]) 02/14/2013  . PVD (peripheral vascular disease) ([place]) 09/05/2009  . Essential hypertension 06/20/2009  . Congestive heart failure ([place]) 06/20/2009  . Asthma 06/20/2009    Orientation RESPIRATION BLADDER Height & Weight     Self,Time,Situation  Normal Continent Weight: 147 lb 4.3 oz (66.8 kg) Height:  5\' 4"  (162.6 cm)  BEHAVIORAL SYMPTOMS/MOOD NEUROLOGICAL BOWEL NUTRITION STATUS      Continent Diet (please see discharge summary)  AMBULATORY STATUS COMMUNICATION OF NEEDS Skin   Limited Assist Verbally Surgical wounds (closed incsion RT groin,closed incision RT leg, closed incision RT foot)                       Personal Care Assistance Level of Assistance  Bathing,Feeding,Dressing Bathing Assistance: Limited assistance Feeding assistance: Independent Dressing Assistance: Limited assistance     Functional Limitations Info  Sight,Hearing,Speech Sight Info: Adequate Hearing Info: Adequate Speech Info: Adequate    SPECIAL CARE FACTORS FREQUENCY  PT (By licensed PT),OT (By licensed OT)     PT Frequency: 5x per week OT Frequency: 5x per week  Contractures Contractures Info: Not present    Additional Factors Info  Code Status,Allergies Code Status Info: FULL Allergies Info: Atenolol,Adhesive tape,Contrast Media,[email or web address].latex           Current Medications (03/18/2020):  This is the current hospital active medication list Current Facility-Administered  Medications  Medication Dose Route Frequency Provider Last Rate Last Admin  . acetaminophen (TYLENOL) tablet 325-650 mg  325-650 mg Oral Q4H PRN Dagoberto Ligas, PA-C   650 mg at 03/18/20 1028   Or  . acetaminophen (TYLENOL) suppository 325-650 mg  325-650 mg Rectal Q4H PRN Dagoberto Ligas, PA-C      . allopurinol (ZYLOPRIM) tablet 100 mg  100 mg Oral BID Dagoberto Ligas, PA-C   100 mg at 03/18/20 1023  . aspirin EC tablet 81 mg  81 mg Oral Daily PRN Dagoberto Ligas, PA-C      . atorvastatin (LIPITOR) tablet 10 mg  10 mg Oral Daily Dagoberto Ligas, PA-C   10 mg at 03/18/20 1024  . Chlorhexidine Gluconate Cloth 2 % PADS 6 each  6 each Topical Q0600 Penninger, [place], [place]      . Darbepoetin Alfa (ARANESP) injection 60 mcg  60 mcg Intravenous Q Sat-HD Penninger, Lindsay, PA   60 mcg at 03/17/20 1211  . docusate sodium (COLACE) capsule 100 mg  100 mg Oral BID Dagoberto Ligas, PA-C   100 mg at 03/18/20 1028  . doxercalciferol (HECTOROL) injection 3 mcg  3 mcg Intravenous Q T,Th,Sa-HD Penninger, Lindsay, PA   3 mcg at 03/17/20 1108  . guaiFENesin-dextromethorphan (ROBITUSSIN DM) 100-10 MG/5ML syrup 15 mL  15 mL Oral Q4H PRN Dagoberto Ligas, PA-C      . heparin injection 5,000 Units  5,000 Units Subcutaneous Q8H Dagoberto Ligas, PA-C   5,000 Units at 03/18/20 0645  . hydrALAZINE (APRESOLINE) injection 5 mg  5 mg Intravenous Q20 Min PRN Dagoberto Ligas, PA-C      . hydrALAZINE (APRESOLINE) tablet 75 mg  75 mg Oral TID Dagoberto Ligas, PA-C      . insulin aspart (novoLOG) injection 0-15 Units  0-15 Units Subcutaneous TID WC Dagoberto Ligas, PA-C   2 Units at 03/18/20 1148  . isosorbide dinitrate (ISORDIL) tablet 10 mg  10 mg Oral TID Dagoberto Ligas, PA-C   10 mg at 03/18/20 1024  . labetalol (NORMODYNE) injection 10 mg  10 mg Intravenous Q10 min PRN Dagoberto Ligas, PA-C      . magnesium sulfate IVPB 2 g 50 mL  2 g Intravenous Daily PRN Dagoberto Ligas, PA-C      . metoprolol tartrate  (LOPRESSOR) injection 2-5 mg  2-5 mg Intravenous Q2H PRN Dagoberto Ligas, PA-C      . morphine 2 MG/ML injection 2 mg  2 mg Intravenous Q2H PRN Dagoberto Ligas, PA-C      . ondansetron (ZOFRAN) injection 4 mg  4 mg Intravenous Q6H PRN Dagoberto Ligas, PA-C      . oxyCODONE-acetaminophen (PERCOCET/ROXICET) 5-325 MG per tablet 1-2 tablet  1-2 tablet Oral Q4H PRN Dagoberto Ligas, PA-C   1 tablet at 03/18/20 0415  . pantoprazole (PROTONIX) EC tablet 40 mg  40 mg Oral Daily Dagoberto Ligas, PA-C   40 mg at 03/18/20 1024  . phenol (CHLORASEPTIC) mouth spray 1 spray  1 spray Mouth/Throat PRN Dagoberto Ligas, PA-C      . potassium chloride SA (KLOR-CON) CR tablet 20-40 mEq  20-40 mEq Oral Daily PRN Dagoberto Ligas, PA-C      . sevelamer carbonate (RENVELA) tablet 2,400 mg  2,400  mg Oral TID WC Penninger, Lindsay, PA   2,400 mg at 03/18/20 1149  . traZODone (DESYREL) tablet 25 mg  25 mg Oral QHS Dagoberto Ligas, PA-C   25 mg at 03/17/20 2135  . traZODone (DESYREL) tablet 25 mg  25 mg Oral QHS PRN Blenda Nicely, [facility]         Discharge Medications: Please see discharge summary for a list of discharge medications.  Relevant Imaging Results:  Relevant Lab Results:   Additional Information SSN [id number]    received pfizer vaccine 2/23 & 3/16   Dialysis Patient [facility] chair time 3:45pm  Vinie Sill, LCSWA

## 2020-03-19 ENCOUNTER — Encounter (HOSPITAL_COMMUNITY): Payer: Self-pay | Admitting: Vascular Surgery

## 2020-03-19 ENCOUNTER — Inpatient Hospital Stay (HOSPITAL_COMMUNITY): Payer: Medicare Other

## 2020-03-19 DIAGNOSIS — Z9889 Other specified postprocedural states: Secondary | ICD-10-CM

## 2020-03-19 DIAGNOSIS — L0390 Cellulitis, unspecified: Secondary | ICD-10-CM

## 2020-03-19 DIAGNOSIS — I739 Peripheral vascular disease, unspecified: Secondary | ICD-10-CM

## 2020-03-19 LAB — CBC
HCT: 24.1 % — ABNORMAL LOW (ref 36.0–46.0)
Hemoglobin: 8.3 g/dL — ABNORMAL LOW (ref 12.0–15.0)
MCH: 33.5 pg (ref 26.0–34.0)
MCHC: 34.4 g/dL (ref 30.0–36.0)
MCV: 97.2 fL (ref 80.0–100.0)
Platelets: 166 10*3/uL (ref 150–400)
RBC: 2.48 MIL/uL — ABNORMAL LOW (ref 3.87–5.11)
RDW: 16.2 % — ABNORMAL HIGH (ref 11.5–15.5)
WBC: 6.7 10*3/uL (ref 4.0–10.5)
nRBC: 0.6 % — ABNORMAL HIGH (ref 0.0–0.2)

## 2020-03-19 LAB — GLUCOSE, CAPILLARY
Glucose-Capillary: 130 mg/dL — ABNORMAL HIGH (ref 70–99)
Glucose-Capillary: 138 mg/dL — ABNORMAL HIGH (ref 70–99)
Glucose-Capillary: 167 mg/dL — ABNORMAL HIGH (ref 70–99)
Glucose-Capillary: 103 mg/dL — ABNORMAL HIGH (ref 70–99)

## 2020-03-19 MED ORDER — CHLORHEXIDINE GLUCONATE CLOTH 2 % EX PADS
6.0000 | MEDICATED_PAD | Freq: Every day | CUTANEOUS | Status: DC
  Administered 2020-03-20 – 2020-03-27 (×5): 6 via TOPICAL

## 2020-03-19 NOTE — Progress Notes (Signed)
Mobility Specialist - Progress Note   03/19/20 1427  Mobility  Activity Transferred:  Chair to bed  Level of Assistance Moderate assist, patient does 50-74%  Assistive Device Front wheel walker  Mobility Response Tolerated well  Mobility performed by Mobility specialist  $Mobility charge 1 Mobility    Pre-mobility: 76 HR, 116/46 BP, 100% SpO2 Post-mobility: 80 HR, 117/52 BP, 100% SpO2  Pt mod assist to stand from chair. Endorsed minor dizziness when standing compared to this am w/ PT. Contact guard during pivot. Pt able to slowly scoot laterally to the head of the bed, no assistance to lay down.   Pricilla Handler Mobility Specialist Mobility Specialist Phone: [phone]

## 2020-03-19 NOTE — Care Management Important Message (Signed)
Important Message  Patient Details  Name: [name] MRN: [id number] Date of Birth: [birth year]   Medicare Important Message Given:  Yes     [name] 03/19/2020, 4:27 PM

## 2020-03-19 NOTE — Progress Notes (Signed)
ABI study completed.   Please see CV Proc for preliminary results.   Darlin Coco, RDMS

## 2020-03-19 NOTE — Discharge Instructions (Signed)
 [facility] [place]  Discharge instructions  Lower Extremity Bypass Surgery  Please refer to the following instruction for your post-procedure care. Your surgeon or physician assistant will discuss any changes with you.  Activity  You are encouraged to walk as much as you can. You can slowly return to normal activities during the month after your surgery. Avoid strenuous activity and heavy lifting until your doctor tells you it's OK. Avoid activities such as vacuuming or swinging a golf club. Do not drive until your doctor give the OK and you are no longer taking prescription pain medications. It is also normal to have difficulty with sleep habits, eating and bowel movement after surgery. These will go away with time.  Bathing/Showering  Shower daily after you go home. Do not soak in a bathtub, hot tub, or swim until the incision heals completely.  Incision Care  Clean your incision with mild soap and water. Shower every day. Pat the area dry with a clean towel. You do not need a bandage unless otherwise instructed. Do not apply any ointments or creams to your incision. If you have open wounds you will be instructed how to care for them or a visiting nurse may be arranged for you. If you have staples or sutures along your incision they will be removed at your post-op appointment. You may have skin glue on your incision. Do not peel it off. It will come off on its own in about one week.  Wash the groin wound with soap and water daily and pat dry. (No tub bath-only shower)  Then put a dry gauze or washcloth in the groin to keep this area dry to help prevent wound infection.  Do this daily and as needed.  Do not use Vaseline or neosporin on your incisions.  Only use soap and water on your incisions and then protect and keep dry.  Diet  Resume your normal diet. There are no special food restrictions following this procedure. A low fat/ low cholesterol diet is  recommended for all patients with vascular disease. In order to heal from your surgery, it is CRITICAL to get adequate nutrition. Your body requires vitamins, minerals, and protein. Vegetables are the best source of vitamins and minerals. Vegetables also provide the perfect balance of protein. Processed food has little nutritional value, so try to avoid this.  Medications  Resume taking all your medications unless your doctor or physician assistant tells you not to. If your incision is causing pain, you may take over-the-counter pain relievers such as acetaminophen (Tylenol). If you were prescribed a stronger pain medication, please aware these medication can cause nausea and constipation. Prevent nausea by taking the medication with a snack or meal. Avoid constipation by drinking plenty of fluids and eating foods with high amount of fiber, such as fruits, vegetables, and grains. Take Colace 100 mg (an over-the-counter stool softener) twice a day as needed for constipation.  Do not take Tylenol if you are taking prescription pain medications.  Follow Up  Our office will schedule a follow up appointment 2-3 weeks following discharge.  Please call us immediately for any of the following conditions  Severe or worsening pain in your legs or feet while at rest or while walking Increase pain, redness, warmth, or drainage (pus) from your incision site(s) Fever of 101 degree or higher The swelling in your leg with the bypass suddenly worsens and becomes more painful than when you were in the hospital If you have   been instructed to feel your graft pulse then you should do so every day. If you can no longer feel this pulse, call the office immediately. Not all patients are given this instruction.  Leg swelling is common after leg bypass surgery.  The swelling should improve over a few months following surgery. To improve the swelling, you may elevate your legs above the level of your heart while you are  sitting or resting. Your surgeon or physician assistant may ask you to apply an ACE wrap or wear compression ([name]) stockings to help to reduce swelling.  Reduce your risk of vascular disease  Stop smoking. If you would like help call [facility] at 1-800-QUIT-NOW (1-800-784-8669) or [facility] at 336-586-4000.  Manage your cholesterol Maintain a desired weight Control your diabetes weight Control your diabetes Keep your blood pressure down  If you have any questions, please call the office at [phone]  

## 2020-03-19 NOTE — Progress Notes (Signed)
Physical Therapy Treatment Patient Details Name: [name] MRN: [id number] DOB: 12-11-1945 Today's Date: 03/19/2020    History of Present Illness 74 yo female with R foot wound has been admitted for R femoral endarterectomy and R fem-pop bypass graft. Orthostatic hypotension post surgery. PMHx:  2020 L fem to below knee pop bypass with saphenous vein, toe amputations, breast CA, L shoulder dislocation, SOB, CKD, CAD, NSTEMI, OA, gout    PT Comments    Pt admitted with above diagnosis. Pt sat EOB and upon standing she reported dizziness and while she did not pass out completely, she was unresponsive for up to 10 seconds and then began talking to PT once this PT laid her down.  BP 77/83.    PT called nursing and when they arrived, discussed BP and it did return to 96/41 after a minute in bed.  Nurse to call MD for meds and agreed for PT to attempt to transfer pt to chair to assist with BP sttabilization.  Pt did not have symptoms with stand pivot transfer to chair with BP 72/41 initially and 82/44 after 2 minutes in chair with feet elevated. Changed frequency to 2x week as this is the appropriate frequency for a pt going to SNF.  Will continue to progress pt as able.  Pt currently with functional limitations due to balance and endurance deficits. Pt will benefit from skilled PT to increase their independence and safety with mobility to allow discharge to the venue listed below.     Follow Up Recommendations  SNF     Equipment Recommendations  Rolling walker with 5" wheels    Recommendations for Other Services       Precautions / Restrictions Precautions Precautions: Fall Precaution Comments: watch BP closely Required Braces or Orthoses: Other Brace Other Brace: darco shoe R LE Restrictions RLE Weight Bearing:  (MD progress notes reports off set weight R heel)    Mobility  Bed Mobility Overal bed mobility: Needs Assistance Bed Mobility: Rolling;Supine to Sit;Sit to  Supine Rolling: Min guard   Supine to sit: Min assist Sit to supine: Mod assist   General bed mobility comments: pt requires  increased time to progress to eob. pt able to assist with bed rails progress to static sitting with trunk elevation . pt requires assist for R LE back onto bed surface.  Transfers Overall transfer level: Needs assistance Equipment used: Rolling walker (2 wheeled) Transfers: Sit to/from [facility] Sit to Stand: Min assist;From elevated surface Stand pivot transfers: Min assist       General transfer comment: pt able to power up from surface with counting rocking momentum 1-2-3. Pt needs steadying assist initially upon standing but did obtain balance eventually. Upon standing, pt reports "I feel dizzy" and noted pt staring and speech beginning to slur therefore laid her down quickly and took BP at 77/83.  called nursing and when they arrived, discussed BP and it did return to 96/41 after a minute in bed.  Nurse to call MD for meds and agreed for PT to attempt to transfer pt to chair to assist with BP sttabilization.  Pt did not have symptoms with stand pivot transfer to chair with BP 72/41 initially and 82/44 after 2 minutes in chair with feet elevated.  Nurse agrees for pt to stay up in chair.  Ambulation/Gait             General Gait Details: unable to walk away from bed due to instabilty and BP issues  Stairs             Wheelchair Mobility    Modified Rankin (Stroke Patients Only)       Balance Overall balance assessment: Needs assistance Sitting-balance support: Feet supported;No upper extremity supported Sitting balance-Leahy Scale: Good     Standing balance support: Bilateral upper extremity supported;During functional activity Standing balance-Leahy Scale: Poor Standing balance comment: relies on UE support for balance                            Cognition Arousal/Alertness: Awake/alert Behavior During  Therapy: WFL for tasks assessed/performed Overall Cognitive Status: No family/caregiver present to determine baseline cognitive functioning                                 General Comments: pt able to recall therapist name during session however demonstrates multiple times during session difficutly with sequencing task.      Exercises General Exercises - Lower Extremity Ankle Circles/Pumps: AROM;Both;10 reps;Supine Long Arc Quad: AROM;Both;10 reps;Seated Heel Slides: AROM;Both;10 reps;Supine    General Comments General comments (skin integrity, edema, etc.): See BP issues above. Other VSS      Pertinent Vitals/Pain Pain Assessment: Faces Faces Pain Scale: Hurts little more Pain Location: states "i dont have any right now but boy it can come on fast. i have made the decision to not take pain medication" Pain Descriptors / Indicators: Operative site guarding;Guarding;Grimacing Pain Intervention(s): Limited activity within patient's tolerance;Monitored during session;Repositioned    Home Living                      Prior Function            PT Goals (current goals can now be found in the care plan section) Progress towards PT goals: Not progressing toward goals - comment (orthostatic BP hinders progression)    Frequency    Min 2X/week      PT Plan Current plan remains appropriate;Frequency needs to be updated    Co-evaluation              AM-PAC PT "6 Clicks" Mobility   Outcome Measure  Help needed turning from your back to your side while in a flat bed without using bedrails?: A Little Help needed moving from lying on your back to sitting on the side of a flat bed without using bedrails?: A Lot Help needed moving to and from a bed to a chair (including a wheelchair)?: A Lot Help needed standing up from a chair using your arms (e.g., wheelchair or bedside chair)?: A Lot Help needed to walk in hospital room?: A Lot Help needed climbing 3-5  steps with a railing? : Total 6 Click Score: 12    End of Session Equipment Utilized During Treatment: Gait belt Activity Tolerance: Patient limited by fatigue;Treatment limited secondary to medical complications (Comment) (Orthostasis) Patient left: with call bell/phone within reach;in chair;with chair alarm set Nurse Communication: Mobility status PT Visit Diagnosis: Unsteadiness on feet (R26.81);Muscle weakness (generalized) (M62.81);Pain Pain - Right/Left: Right Pain - part of body: Ankle and joints of foot     Time: 1000-1030 PT Time Calculation (min) (ACUTE ONLY): 30 min  Charges:  $Therapeutic Exercise: 8-22 mins $Therapeutic Activity: 8-22 mins                     Jonta Gastineau W,PT Acute Rehabilitation  Services Pager:  [phone]  Office:  [place] 03/19/2020, 1:08 PM

## 2020-03-19 NOTE — Progress Notes (Signed)
[facility] Progress Note   Subjective:   Patient seen and examined at bedside.  Just finished breakfast. Reports feeling pretty good, surprised how much better she is feeling after this surgery versus last time.  Pain well controlled.  Denies CP, SOB, edema, weakness, dizziness and fatigue.    After seeing pt nurse episode of orthostatic hypotension when PT moved from the bed to chair.    Objective Vitals:   03/18/20 2316 03/19/20 0324 03/19/20 0749 03/19/20 1124  BP: (!) 102/41 (!) 124/43 (!) 114/52 (!) 104/42  Pulse: 62 70 76 76  Resp: 16 15 18 14   Temp: 98.5 F (36.9 C) 98.1 F (36.7 C) 97.9 F (36.6 C) 98.3 F (36.8 C)  TempSrc: Oral Oral Oral Oral  SpO2: 100% 100% 100% 100%  Weight:      Height:       Physical Exam General:WDWN female in NAD Heart:RRR, no mrg Lungs:CTAB, nml WOB on RA - nasal cannula is on top of her head Abdomen:soft, NTND Extremities:no LE edema, L foot dressed Dialysis Access: RU AVF, +b/t   Filed Weights   03/16/20 0604 03/17/20 0935 03/17/20 1254  Weight: 69.9 kg 69.4 kg 66.8 kg    Intake/Output Summary (Last 24 hours) at 03/19/2020 1152 Last data filed at 03/19/2020 0523 Gross per 24 hour  Intake 360 ml  Output --  Net 360 ml    Additional Objective Labs: Basic Metabolic Panel: Recent Labs  Lab 03/16/20 2333 03/17/20 0943 03/18/20 1852  NA 140 136 135  K 4.0 3.8 4.0  CL 99 98 96*  CO2 30 26 26   GLUCOSE 203* 225* 125*  BUN 34* 39* 38*  CREATININE 3.96* 4.26* 4.65*  CALCIUM 7.7* 7.9* 8.5*  PHOS  --  5.7* 5.2*   Liver Function Tests: Recent Labs  Lab 03/14/20 1421 03/17/20 0943 03/18/20 1852  AST 20  --   --   ALT 17  --   --   ALKPHOS 182*  --   --   BILITOT 0.8  --   --   PROT 6.5  --   --   ALBUMIN 3.6 3.0* 2.7*   CBC: Recent Labs  Lab 03/14/20 1421 03/16/20 0603 03/16/20 1531 03/16/20 2333 03/17/20 0943  WBC 7.8  --  7.7 6.8 7.8  HGB 12.5   < > 8.2* 7.5* 8.2*  HCT 36.6   < > 24.1* 21.6*  25.7*  MCV 97.9  --  96.0 96.0 98.5  PLT 151  --  113* 118* 143*   < > = values in this interval not displayed.   Blood Culture    Component Value Date/Time   SDES BLOOD BLOOD LEFT FOREARM 03/23/2019 1538   SPECREQUEST  03/23/2019 1538    BOTTLES DRAWN AEROBIC AND ANAEROBIC Blood Culture results may not be optimal due to an excessive volume of blood received in culture bottles   CULT  03/23/2019 1538    NO GROWTH 5 DAYS Performed at [facility], [place] [address], [place], [place] [address]    REPTSTATUS 03/28/2019 FINAL 03/23/2019 1538   CBG: Recent Labs  Lab 03/18/20 1141 03/18/20 1558 03/18/20 2148 03/19/20 0637 03/19/20 1120  GLUCAP 126* 130* 127* 130* 138*   Iron Studies:  Recent Labs    03/18/20 1122  IRON 52  TIBC 217*   Lab Results  Component Value Date   INR 1.2 03/14/2020   INR 1.1 03/28/2019   INR 1.2 03/23/2019    Medications: .  magnesium sulfate bolus IVPB     . allopurinol  100 mg Oral BID  . atorvastatin  10 mg Oral Daily  . Chlorhexidine Gluconate Cloth  6 each Topical Q0600  . darbepoetin (ARANESP) injection - DIALYSIS  60 mcg Intravenous Q Sat-HD  . docusate sodium  100 mg Oral BID  . doxercalciferol  3 mcg Intravenous Q T,Th,Sa-HD  . heparin  5,000 Units Subcutaneous Q8H  . hydrALAZINE  75 mg Oral TID  . insulin aspart  0-15 Units Subcutaneous TID WC  . isosorbide dinitrate  10 mg Oral TID  . sevelamer carbonate  2,400 mg Oral TID WC  . traZODone  25 mg Oral QHS    Dialysis Orders: TTS -NW 3hrs59min, BFR350, BVQ945, EDW 70kg,2K/2Ca, P2 Access:RU AVF Heparin2500 Mircera42mcg q4wks - last11/4 Hectorol29mcg IV qHD  Problem/Plan: 1. Non healing ulcer R foot/critical limb ischemia- s/pDr. Lonn [place] external iliac &common fem endarterectomy w/darcon patch angioplasty and R fem to anterior tibial bypass12/10. PlanPer VVS 2. ESRD- On HD TTS.HD tomorrow per regular schedule. K 4.0. Hold heparin due to recent  surgery.  3. Hypertension/volume- Episode of orthostatic hypotension this AM when moving to chair.  Hydralazine given this AM, had been ordered but not given prior likely due to hypotension.  Will d/c for now, can add back if BP starts to increase. Does not appear volume overloaded. Need standing weights to assess EDW. UF as tolerated.  4. Anemiaof CKD- Hgb 7.1 post surgery, improved to 8.2 s/p1 unit pRBC on 12/10. Cont Aranesp 60 mcg qSat. tsat 24%. Give short iron course. Recheck CBC.  5. Secondary Hyperparathyroidism -Ca and phos in goal close to goal. Continue VDRA and binders -3 renvela  6. Nutrition- Renal diet w/ fluid restrictions.  7. Hx CAD 8. Systolic/diastolic HF    Jen Mow, PA-C [facility] 03/19/2020,11:52 AM  LOS: 3 days

## 2020-03-19 NOTE — Progress Notes (Addendum)
Progress Note    03/19/2020 7:27 AM 3 Days Post-Op  Subjective:  Says her hip is sore but leg feels okay  Afebrile HR 60's-80's NSR 381'W-299'B systolic 716% [id number]  Vitals:   03/18/20 2316 03/19/20 0324  BP: (!) 102/41 (!) 124/43  Pulse: 62 70  Resp: 16 15  Temp: 98.5 F (36.9 C) 98.1 F (36.7 C)  SpO2: 100% 100%    Physical Exam: Cardiac:  Regular Lungs:  Non labored Incisions:  Right groin incision looks good-no drainage on bandage.  Ecchymosis right groin.  Right below knee incision is clean and dry. Extremities:  +doppler signals right AT/PT   CBC    Component Value Date/Time   WBC 7.8 03/17/2020 0943   RBC 2.61 (L) 03/17/2020 0943   HGB 8.2 (L) 03/17/2020 0943   HGB 11.7 05/18/2017 1218   HGB 12.3 02/02/2017 1136   HCT 25.7 (L) 03/17/2020 0943   HCT 34.7 05/18/2017 1218   HCT 37.4 02/02/2017 1136   PLT 143 (L) 03/17/2020 0943   PLT 227 05/18/2017 1218   MCV 98.5 03/17/2020 0943   MCV 87 05/18/2017 1218   MCV 90.6 02/02/2017 1136   MCH 31.4 03/17/2020 0943   MCHC 31.9 03/17/2020 0943   RDW 17.0 (H) 03/17/2020 0943   RDW 16.0 (H) 05/18/2017 1218   RDW 15.7 (H) 02/02/2017 1136   LYMPHSABS 1,278 02/13/2020 1518   LYMPHSABS 1.8 02/02/2017 1136   MONOABS 0.8 04/18/2019 0631   MONOABS 0.8 02/02/2017 1136   EOSABS 80 02/13/2020 1518   EOSABS 0.3 02/02/2017 1136   BASOSABS 22 02/13/2020 1518   BASOSABS 0.1 02/02/2017 1136    BMET    Component Value Date/Time   NA 135 03/18/2020 1852   NA 143 05/18/2017 1218   NA 143 02/02/2017 1137   K 4.0 03/18/2020 1852   K 3.8 02/02/2017 1137   CL 96 (L) 03/18/2020 1852   CO2 26 03/18/2020 1852   CO2 24 02/02/2017 1137   GLUCOSE 125 (H) 03/18/2020 1852   GLUCOSE 74 02/02/2017 1137   BUN 38 (H) 03/18/2020 1852   BUN 76 (HH) 05/18/2017 1218   BUN 63.4 (H) 02/02/2017 1137   CREATININE 4.65 (H) 03/18/2020 1852   CREATININE 5.12 (H) 02/13/2020 1518   CREATININE 3.0 (HH) 02/02/2017 1137   CALCIUM 8.5 (L)  03/18/2020 1852   CALCIUM 9.9 02/02/2017 1137   GFRNONAA 9 (L) 03/18/2020 1852   GFRNONAA 8 (L) 02/13/2020 1518   GFRAA 9 (L) 02/13/2020 1518    INR    Component Value Date/Time   INR 1.2 03/14/2020 1421     Intake/Output Summary (Last 24 hours) at 03/19/2020 0727 Last data filed at 03/19/2020 0523 Gross per 24 hour  Intake 360 ml  Output --  Net 360 ml     Assessment:  74 y.o. female is s/p:  R CFA endarterectomy and femoral to ATA bypass with PTFE  3 Days Post-Op  Plan: -pt with +doppler signals right PT/AT -dry gauze to right groin to wick moisture to help prevent wound infection.  Please do not tape gauze in place and if needed, use minimal tape.  -disposition is SNF vs HHPT-will see what PT recs are today.  OT recommending [facility].     Leontine Locket, PA-C [facility] [phone] 03/19/2020 7:27 AM   VASCULAR STAFF ADDENDUM: I have independently interviewed and examined the patient. I agree with the above.  PT / OT / OOB / Ambulate. Home in next  48 hours.  Yevonne Aline. Stanford Breed, MD [facility] Phone Number: [phone] 03/19/2020 11:09 AM

## 2020-03-20 LAB — GLUCOSE, CAPILLARY
Glucose-Capillary: 107 mg/dL — ABNORMAL HIGH (ref 70–99)
Glucose-Capillary: 133 mg/dL — ABNORMAL HIGH (ref 70–99)
Glucose-Capillary: 102 mg/dL — ABNORMAL HIGH (ref 70–99)
Glucose-Capillary: 107 mg/dL — ABNORMAL HIGH (ref 70–99)

## 2020-03-20 NOTE — Progress Notes (Signed)
[name] Progress Note   Subjective:   [name] seen in room, no c/o's.   Objective Vitals:   03/19/20 2300 03/20/20 0353 03/20/20 0837 03/20/20 1113  BP: (!) 103/46 (!) 115/44 (!) 109/50 (!) 110/46  Pulse: 64 77 78 68  Resp: 18 18 19 15   Temp: 98.5 F (36.9 C) 97.6 F (36.4 C) 97.6 F (36.4 C) 97.6 F (36.4 C)  TempSrc: Oral Oral Oral Oral  SpO2: 100% 99% 96% 96%  Weight:      Height:       Physical Exam General:WDWN female in NAD Heart:RRR, no mrg Lungs:CTAB, nml WOB on RA - nasal cannula is on top of her head Abdomen:soft, NTND Extremities:no LE edema, L foot dressed Dialysis Access: RU AVF, +b/t   Dialysis Orders: TTS -NW 3h 41min   350/ 800    70kg  2K/2Ca bath  Hep 2500  R AVF P2 Mircera47mcg q4wks - last11/4 Hectorol82mcg IV qHD  Problem/Plan: 1. Non healing ulcer R foot/critical limb ischemia- s/pDr. Lonn [place] external iliac &common fem endarterectomy w/darcon patch angioplasty and R fem to anterior tibial bypass12/10. PlanPer VVS 2. ESRD- HD TTS. HD today. Hold hep d/t recent surgery.  3. Hypertension/volume- some low BP's w/ standing, watch  4. Anemiaof CKD- Hgb 7.1 post surgery, improved to 8.2 s/p1 unit pRBC on 12/10. Cont Aranesp 60 mcg qSat. tsat 24%. Give short iron course. Recheck CBC.  5. Secondary Hyperparathyroidism -Ca and phos in goal close to goal. Continue VDRA and binders -3 renvela  6. Nutrition- Renal diet w/ fluid restrictions.  7. Hx CAD 8. Systolic/diastolic HF   [name], MD 03/20/2020, 3:53 PM   Filed Weights   03/16/20 0604 03/17/20 0935 03/17/20 1254  Weight: 69.9 kg 69.4 kg 66.8 kg    Intake/Output Summary (Last 24 hours) at 03/20/2020 1552 Last data filed at 03/20/2020 0600 Gross per 24 hour  Intake 720 ml  Output --  Net 720 ml    Additional Objective Labs: Basic Metabolic Panel: Recent Labs  Lab 03/16/20 2333 03/17/20 0943 03/18/20 1852  NA 140 136 135  K 4.0  3.8 4.0  CL 99 98 96*  CO2 30 26 26   GLUCOSE 203* 225* 125*  BUN 34* 39* 38*  CREATININE 3.96* 4.26* 4.65*  CALCIUM 7.7* 7.9* 8.5*  PHOS  --  5.7* 5.2*   Liver Function Tests: Recent Labs  Lab 03/14/20 1421 03/17/20 0943 03/18/20 1852  AST 20  --   --   ALT 17  --   --   ALKPHOS 182*  --   --   BILITOT 0.8  --   --   PROT 6.5  --   --   ALBUMIN 3.6 3.0* 2.7*   CBC: Recent Labs  Lab 03/14/20 1421 03/16/20 0603 03/16/20 1531 03/16/20 2333 03/17/20 0943 03/19/20 1319  WBC 7.8  --  7.7 6.8 7.8 6.7  HGB 12.5   < > 8.2* 7.5* 8.2* 8.3*  HCT 36.6   < > 24.1* 21.6* 25.7* 24.1*  MCV 97.9  --  96.0 96.0 98.5 97.2  PLT 151  --  113* 118* 143* 166   < > = values in this interval not displayed.   Blood Culture    Component Value Date/Time   SDES BLOOD BLOOD LEFT FOREARM 03/23/2019 1538   SPECREQUEST  03/23/2019 1538    BOTTLES DRAWN AEROBIC AND ANAEROBIC Blood Culture results may not be optimal due to an excessive volume of blood received in culture  bottles   CULT  03/23/2019 1538    NO GROWTH 5 DAYS Performed at [facility], [place] [address] [place], [place] [address]    REPTSTATUS 03/28/2019 FINAL 03/23/2019 1538   CBG: Recent Labs  Lab 03/19/20 1120 03/19/20 1713 03/19/20 2116 03/20/20 0559 03/20/20 1112  GLUCAP 138* 167* 103* 107* 133*   Iron Studies:  Recent Labs    03/18/20 1122  IRON 52  TIBC 217*   Lab Results  Component Value Date   INR 1.2 03/14/2020   INR 1.1 03/28/2019   INR 1.2 03/23/2019    Medications: . magnesium sulfate bolus IVPB     . allopurinol  100 mg Oral BID  . atorvastatin  10 mg Oral Daily  . Chlorhexidine Gluconate Cloth  6 each Topical Q0600  . Chlorhexidine Gluconate Cloth  6 each Topical Q0600  . darbepoetin (ARANESP) injection - DIALYSIS  60 mcg Intravenous Q Sat-HD  . docusate sodium  100 mg Oral BID  . doxercalciferol  3 mcg Intravenous Q T,Th,Sa-HD  . heparin  5,000 Units Subcutaneous Q8H  . insulin aspart   0-15 Units Subcutaneous TID WC  . isosorbide dinitrate  10 mg Oral TID  . sevelamer carbonate  2,400 mg Oral TID WC  . traZODone  25 mg Oral QHS

## 2020-03-20 NOTE — Progress Notes (Addendum)
Progress Note    03/20/2020 7:47 AM 4 Days Post-Op  Subjective:  Pain controlled. Sitting up eating breaskfast   Vitals:   03/19/20 2300 03/20/20 0353  BP: (!) 103/46 (!) 115/44  Pulse: 64 77  Resp: 18 18  Temp: 98.5 F (36.9 C) 97.6 F (36.4 C)  SpO2: 100% 99%    Physical Exam: Cardiac:  Irregular rhythm; reg rate Lungs:  Non-labored Incisions:  Well approximated. No hematoma Extremities:  Clean, dry new dressing on left foot. Brisk AT Doppler signal. calf soft  CBC    Component Value Date/Time   WBC 6.7 03/19/2020 1319   RBC 2.48 (L) 03/19/2020 1319   HGB 8.3 (L) 03/19/2020 1319   HGB 11.7 05/18/2017 1218   HGB 12.3 02/02/2017 1136   HCT 24.1 (L) 03/19/2020 1319   HCT 34.7 05/18/2017 1218   HCT 37.4 02/02/2017 1136   PLT 166 03/19/2020 1319   PLT 227 05/18/2017 1218   MCV 97.2 03/19/2020 1319   MCV 87 05/18/2017 1218   MCV 90.6 02/02/2017 1136   MCH 33.5 03/19/2020 1319   MCHC 34.4 03/19/2020 1319   RDW 16.2 (H) 03/19/2020 1319   RDW 16.0 (H) 05/18/2017 1218   RDW 15.7 (H) 02/02/2017 1136   LYMPHSABS 1,278 02/13/2020 1518   LYMPHSABS 1.8 02/02/2017 1136   MONOABS 0.8 04/18/2019 0631   MONOABS 0.8 02/02/2017 1136   EOSABS 80 02/13/2020 1518   EOSABS 0.3 02/02/2017 1136   BASOSABS 22 02/13/2020 1518   BASOSABS 0.1 02/02/2017 1136    BMET    Component Value Date/Time   NA 135 03/18/2020 1852   NA 143 05/18/2017 1218   NA 143 02/02/2017 1137   K 4.0 03/18/2020 1852   K 3.8 02/02/2017 1137   CL 96 (L) 03/18/2020 1852   CO2 26 03/18/2020 1852   CO2 24 02/02/2017 1137   GLUCOSE 125 (H) 03/18/2020 1852   GLUCOSE 74 02/02/2017 1137   BUN 38 (H) 03/18/2020 1852   BUN 76 (HH) 05/18/2017 1218   BUN 63.4 (H) 02/02/2017 1137   CREATININE 4.65 (H) 03/18/2020 1852   CREATININE 5.12 (H) 02/13/2020 1518   CREATININE 3.0 (HH) 02/02/2017 1137   CALCIUM 8.5 (L) 03/18/2020 1852   CALCIUM 9.9 02/02/2017 1137   GFRNONAA 9 (L) 03/18/2020 1852   GFRNONAA 8  (L) 02/13/2020 1518   GFRAA 9 (L) 02/13/2020 1518     Intake/Output Summary (Last 24 hours) at 03/20/2020 0747 Last data filed at 03/20/2020 0600 Gross per 24 hour  Intake 1080 ml  Output --  Net 1080 ml    HOSPITAL MEDICATIONS Scheduled Meds: . allopurinol  100 mg Oral BID  . atorvastatin  10 mg Oral Daily  . Chlorhexidine Gluconate Cloth  6 each Topical Q0600  . Chlorhexidine Gluconate Cloth  6 each Topical Q0600  . darbepoetin (ARANESP) injection - DIALYSIS  60 mcg Intravenous Q Sat-HD  . docusate sodium  100 mg Oral BID  . doxercalciferol  3 mcg Intravenous Q T,Th,Sa-HD  . heparin  5,000 Units Subcutaneous Q8H  . insulin aspart  0-15 Units Subcutaneous TID WC  . isosorbide dinitrate  10 mg Oral TID  . sevelamer carbonate  2,400 mg Oral TID WC  . traZODone  25 mg Oral QHS   Continuous Infusions: . magnesium sulfate bolus IVPB     PRN Meds:.acetaminophen **OR** acetaminophen, aspirin EC, guaiFENesin-dextromethorphan, hydrALAZINE, labetalol, magnesium sulfate bolus IVPB, metoprolol tartrate, morphine injection, ondansetron, oxyCODONE-acetaminophen, phenol, potassium chloride, traZODone  Assessment:R  CFA endarterectomy and femoral to ATA bypass with PTFE  4 Days Post-Op     Plan: -awaiting decision on SNF -DVT prophylaxis: heparin [place]   [name], PA-C [facility] [phone] 03/20/2020  7:47 AM   I have examined the patient, reviewed and agree with above.  The patient reports minimal discomfort in her incisions or foot.  Pain mainly in her coccyx area.  Right groin and popliteal incisions healing.  Slight serous drainage from right groin.  Right foot well perfused.  Biphasic anterior tibial and monophasic posterior tibial signal with hand-held Doppler.  Dry eschar over lateral aspect of foot and approximately 1 to 2 cm dry eschar on her heel.  Stable overall.  Awaiting placement.  Again discussed with the patient with extensive tissue loss  even with revascularization we are still at risk for amputation.  We will continue dry dressing to right foot  Curt Jews, MD 03/20/2020 11:34 AM

## 2020-03-20 NOTE — TOC Progression Note (Signed)
Transition of Care [facility]) - Progression Note    Patient Details  Name: [name] MRN: [id number] Date of Birth: 08-14-1945  Transition of Care [facility]) CM/SW [place], [place] Phone Number: 03/20/2020, 1:41 PM  Clinical Narrative:     CSW provided bed offer- along with Medicare.gov ratings-CSW informed patient to review - CSW will follow up on bed choice.   Thurmond Butts, MSW, LCSW Clinical Social Worker    Expected Discharge Plan: Home w Home Health Services Barriers to Discharge: Continued Medical Work up  Expected Discharge Plan and Services Expected Discharge Plan: [place] In-house Referral: Clinical Social Work                                             Social Determinants of Health (SDOH) Interventions    Readmission Risk Interventions Readmission Risk Prevention Plan 03/18/2020  Transportation Screening Complete  HRI or Home Care Consult Complete  SW Recovery Care/Counseling Consult Complete  Palliative Care Screening Not Applicable  Skilled Nursing Facility Complete  Some recent data might be hidden

## 2020-03-20 NOTE — Progress Notes (Signed)
Mobility Specialist - Progress Note   03/20/20 1439  Mobility  Activity Refused mobility   Refused, wants to rest before dialysis.   Pricilla Handler Mobility Specialist Mobility Specialist Phone: [phone]

## 2020-03-21 LAB — GLUCOSE, CAPILLARY
Glucose-Capillary: 114 mg/dL — ABNORMAL HIGH (ref 70–99)
Glucose-Capillary: 122 mg/dL — ABNORMAL HIGH (ref 70–99)
Glucose-Capillary: 128 mg/dL — ABNORMAL HIGH (ref 70–99)
Glucose-Capillary: 133 mg/dL — ABNORMAL HIGH (ref 70–99)
Glucose-Capillary: 137 mg/dL — ABNORMAL HIGH (ref 70–99)

## 2020-03-21 MED ORDER — MORPHINE SULFATE (PF) 2 MG/ML IV SOLN
INTRAVENOUS | Status: AC
  Administered 2020-03-21: 2 mg via INTRAVENOUS
  Filled 2020-03-21: qty 1

## 2020-03-21 MED ORDER — DOXERCALCIFEROL 4 MCG/2ML IV SOLN
INTRAVENOUS | Status: AC
  Administered 2020-03-21: 3 ug via INTRAVENOUS
  Filled 2020-03-21: qty 2

## 2020-03-21 NOTE — TOC Progression Note (Signed)
Transition of Care [facility]) - Progression Note    Patient Details  Name: [name] MRN: [id number] Date of Birth: 10/14/1945  Transition of Care [facility]) CM/SW [place], [place] Phone Number: 03/21/2020, 12:46 PM  Clinical Narrative:     CSW visit with patient at bedside. Patient states she remains agreeable to SNF([facility]) placement if not admitted into CIR.   CSW returned call to patient's husband,[name]- he requested patient be evaluated by CIR - he expressed believes the patient will get better and prove at a faster rate than SNF. However, he was agreeable to SNF/[facility] as back plan. CSW contacted [facility]-provided updated  CSW will continue to follow and assist with discharge planning.  Thurmond Butts, MSW, LCSW Clinical Social Worker   Expected Discharge Plan: Home w Home Health Services Barriers to Discharge: Continued Medical Work up  Expected Discharge Plan and Services Expected Discharge Plan: [place] In-house Referral: Clinical Social Work                                             Social Determinants of Health (SDOH) Interventions    Readmission Risk Interventions Readmission Risk Prevention Plan 03/18/2020  Transportation Screening Complete  HRI or Home Care Consult Complete  SW Recovery Care/Counseling Consult Complete  Palliative Care Screening Not Applicable  Skilled Nursing Facility Complete  Some recent data might be hidden

## 2020-03-21 NOTE — Progress Notes (Signed)
[facility] Progress Note   Subjective:   Pt w/ no c/o's.   Objective Vitals:   03/21/20 0345 03/21/20 0427 03/21/20 0927 03/21/20 1111  BP: (!) 123/45 (!) 129/49 (!) 111/45 (!) 119/55  Pulse: 67 77 71 73  Resp: 10 19 14 13   Temp: 98.5 F (36.9 C) (!) 97.1 F (36.2 C) 98.4 F (36.9 C)   TempSrc: Oral Oral Oral Oral  SpO2: 97% 98% 99% 93%  Weight: 68.6 kg     Height:       Physical Exam General:WDWN female in NAD Heart:RRR, no mrg Lungs:CTAB Abdomen:soft, NTND Extremities:no LE edema, L foot dressed Dialysis Access: RU AVF, +b/t   Dialysis Orders: TTS -NW 3h 50min   350/ 800    70kg  2K/2Ca bath  Hep 2500  R AVF P2 Mircera76mcg q4wks - last11/4 Hectorol69mcg IV qHD  Problem/Plan: 1. Non healing ulcer R foot/critical limb ischemia- s/pDr. Lonn [place] external iliac &common fem endarterectomy w/darcon patch angioplasty and R fem to anterior tibial bypass12/10. PlanPer VVS 2. ESRD- HD TTS. HD tomorrow. OK to resume hep w/ HD.   3.  HTN /volume- BP's low / normal, no BP lowering meds, under dry 4. Anemiaof CKD- Hgb 7.1 post surgery, improved to 8.2 s/p1 unit pRBC on 12/10. Cont Aranesp 60 mcg qSat. tsat 24%. Give short iron course. Recheck CBC.  5. Secondary Hyperparathyroidism -Ca and phos in goal close to goal. Continue VDRA and binders -3 renvela  6. Nutrition- Renal diet w/ fluid restrictions.  7. Hx CAD 8. Systolic/diastolic HF   [name], MD 03/21/2020, 2:54 PM   Filed Weights   03/17/20 0935 03/17/20 1254 03/21/20 0345  Weight: 69.4 kg 66.8 kg 68.6 kg    Intake/Output Summary (Last 24 hours) at 03/21/2020 1454 Last data filed at 03/21/2020 0946 Gross per 24 hour  Intake 480 ml  Output 862 ml  Net -382 ml    Additional Objective Labs: Basic Metabolic Panel: Recent Labs  Lab 03/16/20 2333 03/17/20 0943 03/18/20 1852  NA 140 136 135  K 4.0 3.8 4.0  CL 99 98 96*  CO2 30 26 26   GLUCOSE 203* 225*  125*  BUN 34* 39* 38*  CREATININE 3.96* 4.26* 4.65*  CALCIUM 7.7* 7.9* 8.5*  PHOS  --  5.7* 5.2*   Liver Function Tests: Recent Labs  Lab 03/17/20 0943 03/18/20 1852  ALBUMIN 3.0* 2.7*   CBC: Recent Labs  Lab 03/16/20 1531 03/16/20 2333 03/17/20 0943 03/19/20 1319  WBC 7.7 6.8 7.8 6.7  HGB 8.2* 7.5* 8.2* 8.3*  HCT 24.1* 21.6* 25.7* 24.1*  MCV 96.0 96.0 98.5 97.2  PLT 113* 118* 143* 166   Blood Culture    Component Value Date/Time   SDES BLOOD BLOOD LEFT FOREARM 03/23/2019 1538   SPECREQUEST  03/23/2019 1538    BOTTLES DRAWN AEROBIC AND ANAEROBIC Blood Culture results may not be optimal due to an excessive volume of blood received in culture bottles   CULT  03/23/2019 1538    NO GROWTH 5 DAYS Performed at [facility], [place] [address] [place], [place] [address]    REPTSTATUS 03/28/2019 FINAL 03/23/2019 1538   CBG: Recent Labs  Lab 03/20/20 1635 03/20/20 2014 03/21/20 0427 03/21/20 0608 03/21/20 1113  GLUCAP 102* 107* 114* 122* 128*   Iron Studies:  No results for input(s): IRON, TIBC, TRANSFERRIN, FERRITIN in the last 72 hours. Lab Results  Component Value Date   INR 1.2 03/14/2020   INR 1.1 03/28/2019  INR 1.2 03/23/2019    Medications: . magnesium sulfate bolus IVPB     . allopurinol  100 mg Oral BID  . atorvastatin  10 mg Oral Daily  . Chlorhexidine Gluconate Cloth  6 each Topical Q0600  . darbepoetin (ARANESP) injection - DIALYSIS  60 mcg Intravenous Q Sat-HD  . docusate sodium  100 mg Oral BID  . doxercalciferol  3 mcg Intravenous Q T,Th,Sa-HD  . heparin  5,000 Units Subcutaneous Q8H  . insulin aspart  0-15 Units Subcutaneous TID WC  . isosorbide dinitrate  10 mg Oral TID  . sevelamer carbonate  2,400 mg Oral TID WC  . traZODone  25 mg Oral QHS

## 2020-03-21 NOTE — Consult Note (Signed)
   [facility] Inpatient Consult   03/21/2020  Molli Gethers [facility] 07/14/1945 [id number]   Patient chart reviewed for potential [place] [facility] CM) services due to high unplanned readmission risk score, 35%.   Per chart review, current recommendation is for SNF or CIR. No [facility] CM follow up needs identifiable at this time.  Of note, [facility] Care Management services does not replace or interfere with any services that are arranged by inpatient case management or social work.  Netta Cedars, MSN, [facility] Liaison Nurse Mobile Phone [phone]  Toll free office [phone]

## 2020-03-21 NOTE — Progress Notes (Addendum)
Progress Note    03/21/2020 8:05 AM 5 Days Post-Op  Subjective:  Sleepy-had HD earlier today  Afebrile HR 60's-80's NSR 638'G-665'L systolic 93% RA  Vitals:   03/21/20 0345 03/21/20 0427  BP: (!) 123/45 (!) 129/49  Pulse: 67 77  Resp: 10 19  Temp: 98.5 F (36.9 C) (!) 97.1 F (36.2 C)  SpO2: 97% 98%    Physical Exam: Cardiac:  regular Lungs:  Non labored Incisions:  Right groin with dry gauze in place-minimal drainage; ecchymosis present; right BK incision looks good.   Extremities:  Brisk biphasic right AT and monophasic right PT doppler signals.  Bandage recently changed-dry and in tact.    CBC    Component Value Date/Time   WBC 6.7 03/19/2020 1319   RBC 2.48 (L) 03/19/2020 1319   HGB 8.3 (L) 03/19/2020 1319   HGB 11.7 05/18/2017 1218   HGB 12.3 02/02/2017 1136   HCT 24.1 (L) 03/19/2020 1319   HCT 34.7 05/18/2017 1218   HCT 37.4 02/02/2017 1136   PLT 166 03/19/2020 1319   PLT 227 05/18/2017 1218   MCV 97.2 03/19/2020 1319   MCV 87 05/18/2017 1218   MCV 90.6 02/02/2017 1136   MCH 33.5 03/19/2020 1319   MCHC 34.4 03/19/2020 1319   RDW 16.2 (H) 03/19/2020 1319   RDW 16.0 (H) 05/18/2017 1218   RDW 15.7 (H) 02/02/2017 1136   LYMPHSABS 1,278 02/13/2020 1518   LYMPHSABS 1.8 02/02/2017 1136   MONOABS 0.8 04/18/2019 0631   MONOABS 0.8 02/02/2017 1136   EOSABS 80 02/13/2020 1518   EOSABS 0.3 02/02/2017 1136   BASOSABS 22 02/13/2020 1518   BASOSABS 0.1 02/02/2017 1136    BMET    Component Value Date/Time   NA 135 03/18/2020 1852   NA 143 05/18/2017 1218   NA 143 02/02/2017 1137   K 4.0 03/18/2020 1852   K 3.8 02/02/2017 1137   CL 96 (L) 03/18/2020 1852   CO2 26 03/18/2020 1852   CO2 24 02/02/2017 1137   GLUCOSE 125 (H) 03/18/2020 1852   GLUCOSE 74 02/02/2017 1137   BUN 38 (H) 03/18/2020 1852   BUN 76 (HH) 05/18/2017 1218   BUN 63.4 (H) 02/02/2017 1137   CREATININE 4.65 (H) 03/18/2020 1852   CREATININE 5.12 (H) 02/13/2020 1518   CREATININE 3.0  (HH) 02/02/2017 1137   CALCIUM 8.5 (L) 03/18/2020 1852   CALCIUM 9.9 02/02/2017 1137   GFRNONAA 9 (L) 03/18/2020 1852   GFRNONAA 8 (L) 02/13/2020 1518   GFRAA 9 (L) 02/13/2020 1518    INR    Component Value Date/Time   INR 1.2 03/14/2020 1421     Intake/Output Summary (Last 24 hours) at 03/21/2020 0805 Last data filed at 03/21/2020 0345 Gross per 24 hour  Intake 240 ml  Output 862 ml  Net -622 ml     Assessment:  74 y.o. female is s/p:  #1 right external iliac and common femoral endarterectomy with Dacron patch angioplasty, #2 right femoral to anterior tibial bypass with 6 mm ringed propatent Gore-Tex graft  5 Days Post-Op  Plan: -pt doing well this morning with good doppler signals right foot.   -she has heel floated off bed-continue -continue dry dressing without tape to right groin to wick moisture to help prevent wound infection.   -continue mobilization -DVT prophylaxis:  Heparin -awaiting placement-pt has follow up on 1/10 with Dr. Donnetta Hutching in [place].    Leontine Locket, PA-C [facility] [phone] 03/21/2020 8:05 AM  I  have examined the patient, reviewed and agree with above.  Biphasic anterior tibial signal at the ankle.  Discussed with the patient and her husband present.  Still with tissue loss in the lateral aspect of her foot and heel.  Will keep dry currently and may eventually need debridement.  Placement  Curt Jews, MD 03/21/2020 5:17 PM

## 2020-03-21 NOTE — Plan of Care (Signed)
  Problem: Clinical Measurements: Goal: Will remain free from infection Outcome: Progressing Goal: Respiratory complications will improve Outcome: Progressing Goal: Cardiovascular complication will be avoided Outcome: Progressing   

## 2020-03-21 NOTE — Progress Notes (Signed)
Inpatient Rehab Admissions Coordinator Note:   Per family request, pt was screened for CIR candidacy by Shann Medal, PT, DPT.  At this time we are recommending an inpatient rehab consult and I will request an order per our protocol.  Please contact me with questions.   Shann Medal, PT, DPT [phone] 03/21/20 11:05 AM

## 2020-03-21 NOTE — Discharge Summary (Addendum)
Discharge Summary     [name] Aug 18, 1945 74 y.o. female  [id number]  Admission Date: 03/16/2020  Discharge Date: 03/27/20 Physician: [name], MD  Admission Diagnosis: Critical lower limb ischemia [facility]) [I70.229]  HPI:   This is a 74 y.o. female today for evaluation of nonhealing ulceration on her right lateral foot.  She is well-known to me from prior left leg surgery.  She had presented approximately 1 year ago with gangrene of her toes of her left foot.  She underwent arteriography on 03/25/2019 and this revealed bilateral superficial femoral artery occlusions.  She underwent left femoral to below-knee popliteal bypass with saphenous vein.  She underwent toe amputation with Dr. Sharol Given and eventually had healing of all of this.  She has done well from the standpoint of her left leg.  She does have known severe disease in her right leg as well.  She now has developed ulceration over the lateral aspect of her right foot.  She is initiated care at the wound center and is seen here today for evaluation of adequacy of healing.  She does have some discomfort associated with this.  She is on chronic hemodialysis.  She is currently in a wheelchair trying to minimize walking on her right foot.  Hospital Course:  The patient was admitted to the hospital and taken to the operating room on 03/16/2020 and underwent:  #1 right external iliac and common femoral endarterectomy with Dacron patch angioplasty, #2 right femoral to anterior tibial bypass with 6 mm ringed propatent Gore-Tex graft    The pt tolerated the procedure well and was transported to the PACU in good condition.   By POD 1, pt doing well with right foot well perfused.  Nephrology consulted for HD.  Float heels.   POD 2, PT recommending SNF or HHPT if mobility improves.  Dry dressing changes to right foot.  Continues to have brisk doppler signals right foot.   POD 3, dry gauze to right groin to wick moisture.  Continue  mobilization and disposition planning.   POD 4, continues to have brisk doppler signals.    POD 5, pt doing well.  Continue to float heels.  Good doppler signals.  Disposition pending.   POD 6 continues to do well. Brisk doppler AT and PT signals in right foot. Right lower extremity incisions intact and healing nicely. Dry gauze applied to right groin. Patient went to HD. Awaiting disposition. Patient hopeful for inpatient rehab but unfortunately no availability at [facility] or in [facility]. Encouraged mobilization. Transition of care discussed SNF vs home health with patient and her husband  POD 7 patient doing well. Lower extremities well perfused and warm. Brisk PT and AT signals. Incisions intact and healing well. Awaiting disposition  POD 8 continues to do well. Pain well controlled. Mobilizing better. Incisions healing well. Small separation of superior margin of groin incision. Dry dressings applied. Continued adequate perfusion of the right lower extremity. Had Hemodialysis. TOC assisting with discharge planning  POD 9 no complaints. Some frustration with discharge planning and issues with having therapy while at hospital. Tolerating mobilizing with mobility specialist. Right groin incision with very small area of separation. Dry gauze continued. Brisk PT signal in right foot. Awaiting SNF  POD 10 Awaiting SNF  POD 11 Accepted to SNF.  Ready for discharge.  She will follow up with Dr. Donnetta Hutching in the [place] office in about 2-3 weeks.  She will be prescribed 2-3 days of narcotic pain medication for continued  post operative pain control.  She will be discharged in stable condition.  CBC    Component Value Date/Time   WBC 6.9 03/27/2020 0954   RBC 3.01 (L) 03/27/2020 0954   HGB 10.0 (L) 03/27/2020 0954   HGB 11.7 05/18/2017 1218   HGB 12.3 02/02/2017 1136   HCT 30.9 (L) 03/27/2020 0954   HCT 34.7 05/18/2017 1218   HCT 37.4 02/02/2017 1136   PLT 147 (L) 03/27/2020 0954   PLT 227  05/18/2017 1218   MCV 102.7 (H) 03/27/2020 0954   MCV 87 05/18/2017 1218   MCV 90.6 02/02/2017 1136   MCH 33.2 03/27/2020 0954   MCHC 32.4 03/27/2020 0954   RDW 19.5 (H) 03/27/2020 0954   RDW 16.0 (H) 05/18/2017 1218   RDW 15.7 (H) 02/02/2017 1136   LYMPHSABS 1,278 02/13/2020 1518   LYMPHSABS 1.8 02/02/2017 1136   MONOABS 0.8 04/18/2019 0631   MONOABS 0.8 02/02/2017 1136   EOSABS 80 02/13/2020 1518   EOSABS 0.3 02/02/2017 1136   BASOSABS 22 02/13/2020 1518   BASOSABS 0.1 02/02/2017 1136    BMET    Component Value Date/Time   NA 130 (L) 03/27/2020 0954   NA 143 05/18/2017 1218   NA 143 02/02/2017 1137   K 5.1 03/27/2020 0954   K 3.8 02/02/2017 1137   CL 91 (L) 03/27/2020 0954   CO2 22 03/27/2020 0954   CO2 24 02/02/2017 1137   GLUCOSE 97 03/27/2020 0954   GLUCOSE 74 02/02/2017 1137   BUN 43 (H) 03/27/2020 0954   BUN 76 (HH) 05/18/2017 1218   BUN 63.4 (H) 02/02/2017 1137   CREATININE 7.18 (H) 03/27/2020 0954   CREATININE 5.12 (H) 02/13/2020 1518   CREATININE 3.0 (HH) 02/02/2017 1137   CALCIUM 10.7 (H) 03/27/2020 0954   CALCIUM 9.9 02/02/2017 1137   GFRNONAA 6 (L) 03/27/2020 0954   GFRNONAA 8 (L) 02/13/2020 1518   GFRAA 9 (L) 02/13/2020 1518       Discharge Diagnosis:  Critical lower limb ischemia (HCC) [I70.229]  Secondary Diagnosis: Patient Active Problem List   Diagnosis Date Noted  . Critical lower limb ischemia ([place]) 03/16/2020  . Left toe amputee ([place]) 02/13/2020  . Abnormal EKG 02/13/2020  . Diabetic retinopathy of both eyes associated with type 2 diabetes mellitus ([place]) 02/10/2020  . Headache, unspecified 08/02/2019  . Neuropathic pain   . Labile blood glucose   . Debility 04/07/2019  . 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 S/P femoral-popliteal bypass surgery 04/07/2019  . Chronic bilateral low back pain without sciatica   . Abscess of right foot   . Cellulitis of unspecified part of limb 01/27/2019  .  Hyperlipidemia associated with type 2 diabetes mellitus ([place]) 01/09/2019  . Diabetes mellitus due to underlying condition with chronic kidney disease on chronic dialysis, with long-term current use of insulin ([place]) 01/09/2019  . Mild protein-calorie malnutrition ([place]) 09/06/2018  . Anemia in chronic kidney disease 09/03/2018  . Coagulation defect, unspecified ([place]) 09/03/2018  . Iron deficiency anemia, unspecified 09/03/2018  . Pain, unspecified 09/03/2018  . Secondary hyperparathyroidism of renal origin ([place]) 09/03/2018  . End stage renal disease ([place]) 05/24/2018  . ESRD on hemodialysis ([place])   . Generalized weakness   . Mitral valve mass   . Coronary artery disease involving coronary bypass graft of native heart without angina pectoris   . Anemia 05/03/2017  . Diabetes mellitus type 2, insulin dependent ([place]) 05/08/2015  . Overweight (  BMI 25.0-29.9) 02/14/2015  . Medication management 08/04/2014  . Diabetic neuropathy ([place]) 02/17/2014  . Gouty arthritis   . Vitamin D deficiency   . Malignant neoplasm of upper-inner quadrant of left breast in female, estrogen receptor positive ([place]) 02/14/2013  . PVD (peripheral vascular disease) ([place]) 09/05/2009  . Essential hypertension 06/20/2009  . Congestive heart failure ([place]) 06/20/2009  . Asthma 06/20/2009   Past Medical History:  Diagnosis Date  . Allergy   . Anterior dislocation of left shoulder 05/27/2019  . Arthritis    "maybe in my lower back" (08/27/2012)  . 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 Chronic combined systolic and diastolic CHF (congestive heart failure) ([place])   . CKD (chronic kidney disease), stage IV ([place])   . Coronary artery disease    a. NSTEMI in 05/2009 s/p CABG (LIMA-LAD, SVG-diagonal, SVG-OM1/OM 2).   Marland Kitchen Dyspnea    with exertion  . Family history of anesthesia complication    Mom has a hard time to wake up  . Foot ulcer ([place])    "I've had them on both feet"  (08/27/2012)  . Gouty arthritis    "right index finger" (08/27/2012)  . History of radiation therapy 06/09/13-07/06/13   left breast 50Gy  . Hyperlipidemia   . Hypertension   . Infection    right second toe  . Ischemic cardiomyopathy    a. 2011: EF 40-45%. b. EF 55-60% in 04/2017 but shortly after as inpatient was 45-50%.  Marland Kitchen LBBB (left bundle branch block)   . Mitral regurgitation   . Neuropathy    Hx; of B/L feet  . NSTEMI (non-ST elevated myocardial infarction) ([place]) 05/23/2009  . Osteomyelitis of foot ([place])   . PAD (peripheral artery disease) (HCC)    a. LE PAD (patient previously elected hold off L fem-pop), prev followed by Dr. Bridgett Larsson  . PONV (postoperative nausea and vomiting)   . Sinus headache    "occasionally" (08/27/2012)  . Type II diabetes mellitus ([place])   . Vitamin D deficiency      Allergies as of 03/27/2020      Reactions   Atenolol Rash, Other (See Comments)   Exacerbates gout   Adhesive [tape] Other (See Comments)   SKIN IS VERY SENSITIVE AND BRUISES AND TEARS EASILY; PLEASE USE AN ALTERNATIVE TO TAPE!!   Contrast Media [iodinated Diagnostic Agents] Rash   Iohexol Rash   Latex Rash      Medication List    TAKE these medications   allopurinol 100 MG tablet Commonly known as: ZYLOPRIM TAKE 1 TABLET TWICE DAILY TO PREVENT GOUT What changed:   how much to take  how to take this  when to take this  additional instructions   ARTIFICIAL TEAR SOLUTION OP Place 1 drop into both eyes 2 (two) times daily as needed (dry eyes). Refresh   aspirin EC 81 MG tablet Take 81 mg by mouth daily as needed (Will not take if take Excedrin).   atorvastatin 10 MG tablet Commonly known as: LIPITOR TAKE 1 TABLET DAILY FOR CHOLESTEROL. What changed: additional instructions   b complex-vitamin c-folic acid 0.8 MG Tabs tablet Take 1 tablet by mouth daily.   BD Pen Needle Nano U/F 32G X 4 MM Misc Generic drug: Insulin Pen Needle USE TO INJECT TWICE DAILY LANTUS  INJECTIONS   cinacalcet 30 MG tablet Commonly known as: SENSIPAR Take 1 tablet (30 mg total) by mouth daily with supper. What changed: when to take  this   cyclobenzaprine 5 MG tablet Commonly known as: FLEXERIL Take 1 tablet (5 mg total) by mouth 3 (three) times daily as needed for muscle spasms.   Darbepoetin Alfa 100 MCG/0.5ML Sosy injection Commonly known as: ARANESP Inject 0.5 mLs (100 mcg total) into the vein every Tuesday with hemodialysis.   docusate sodium 100 MG capsule Commonly known as: COLACE Take 100 mg by mouth 2 (two) times daily.   doxercalciferol 4 MCG/2ML injection Commonly known as: HECTOROL Inject 2 mLs (4 mcg total) into the vein Every Tuesday,Thursday,and Saturday with dialysis.   Excedrin Extra Strength 250-250-65 MG tablet Generic drug: aspirin-acetaminophen-caffeine Take 1 tablet by mouth 2 (two) times daily as needed (Low back pain/  will not take is take 81 mg asprin).   ferric gluconate 125 mg in sodium chloride 0.9 % 100 mL Inject 125 mg into the vein Every Tuesday,Thursday,and Saturday with dialysis.   hydrALAZINE 25 MG tablet Commonly known as: APRESOLINE Take     3 tablets (75 mg)      3 x /day        for BP & Heart What changed:   how much to take  how to take this  when to take this  additional instructions   HYDROcodone-acetaminophen 5-325 MG tablet Commonly known as: NORCO/VICODIN Take 1 tablet by mouth every 6 (six) hours as needed. What changed:   how much to take  when to take this  reasons to take this  additional instructions   insulin NPH-regular Human (70-30) 100 UNIT/ML injection Inject 10-30 Units into the skin 2 (two) times daily with a meal. Sliding scale If blood sugar is below 79 do not take insulin   Insulin Syringe-Needle U-100 31G X 5/16" 1 ML Misc Commonly known as: BD Insulin Syringe U/F Administer Insulin      3 x /day      with Meals      (Dx-e11.29)   isosorbide dinitrate 10 MG tablet Commonly  known as: ISORDIL TAKE 1 TABLET THREE TIMES A DAY FOR BLOOD PRESSURE AND HEART What changed:   how much to take  how to take this  when to take this  additional instructions   METAMUCIL FIBER PO Take 1 capsule by mouth daily as needed (Constipation).   MIRCERA IJ Mircera   Phillips Colon Health Caps Take 1 capsule by mouth daily.   PRESERVISION AREDS 2 PO Take 2 capsules by mouth daily at 12 noon.   Prodigy No Coding Blood Gluc test strip Generic drug: glucose blood USE 1 STRIP TO CHECK GLUCOSE THREE TIMES DAILY-DX-E11.9   Santyl ointment Generic drug: collagenase Apply 1 application topically See admin instructions. Weekly or some time 2-3 times weekly   sevelamer carbonate 800 MG tablet Commonly known as: RENVELA Take 2 tablets (1,600 mg total) by mouth 3 (three) times daily with meals. What changed:   when to take this  additional instructions   THERAWORX RELIEF EX Apply 1 application topically daily as needed (pain).   traZODone 50 MG tablet Commonly known as: DESYREL Take 25-50 mg by mouth at bedtime.       Discharge Instructions: [facility] Discharge instructions Lower Extremity Bypass Surgery  Please refer to the following instruction for your post-procedure care. Your surgeon or physician assistant will discuss any changes with you.  Activity  You are encouraged to walk as much as you can. You can slowly return to normal activities during the month after your surgery. Avoid  strenuous activity and heavy lifting until your doctor tells you it's OK. Avoid activities such as vacuuming or swinging a golf club. Do not drive until your doctor give the OK and you are no longer taking prescription pain medications. It is also normal to have difficulty with sleep habits, eating and bowel movement after surgery. These will go away with time.  Bathing/Showering  You may shower after you go home. Do not soak in a bathtub, hot  tub, or swim until the incision heals completely.  Incision Care  Clean your incision with mild soap and water. Shower every day. Pat the area dry with a clean towel. You do not need a bandage unless otherwise instructed. Do not apply any ointments or creams to your incision. If you have open wounds you will be instructed how to care for them or a visiting nurse may be arranged for you. If you have staples or sutures along your incision they will be removed at your post-op appointment. You may have skin glue on your incision. Do not peel it off. It will come off on its own in about one week.  Wash the groin wound with soap and water daily and pat dry. (No tub bath-only shower)  Then put a dry gauze or washcloth in the groin to keep this area dry to help prevent wound infection.  Do this daily and as needed.  Do not use Vaseline or neosporin on your incisions.  Only use soap and water on your incisions and then protect and keep dry.  Diet  Resume your normal diet. There are no special food restrictions following this procedure. A low fat/ low cholesterol diet is recommended for all patients with vascular disease. In order to heal from your surgery, it is CRITICAL to get adequate nutrition. Your body requires vitamins, minerals, and protein. Vegetables are the best source of vitamins and minerals. Vegetables also provide the perfect balance of protein. Processed food has little nutritional value, so try to avoid this.  Medications  Resume taking all your medications unless your doctor or Physician Assistant tells you not to. If your incision is causing pain, you may take over-the-counter pain relievers such as acetaminophen (Tylenol). If you were prescribed a stronger pain medication, please aware these medication can cause nausea and constipation. Prevent nausea by taking the medication with a snack or meal. Avoid constipation by drinking plenty of fluids and eating foods with high amount of fiber, such  as fruits, vegetables, and grains. Take Colace 100 mg (an over-the-counter stool softener) twice a day as needed for constipation.  Do not take Tylenol if you are taking prescription pain medications.  Follow Up  Our office will schedule a follow up appointment 2-3 weeks following discharge.  Please call us immediately for any of the following conditions  .Severe or worsening pain in your legs or feet while at rest or while walking .Increase pain, redness, warmth, or drainage (pus) from your incision site(s) . Fever of 101 degree or higher . The swelling in your leg with the bypass suddenly worsens and becomes more painful than when you were in the hospital . If you have been instructed to feel your graft pulse then you should do so every day. If you can no longer feel this pulse, call the office immediately. Not all patients are given this instruction. .  Leg swelling is common after leg bypass surgery.  The swelling should improve over a few months following surgery. To improve the swelling, you  may elevate your legs above the level of your heart while you are sitting or resting. Your surgeon or physician assistant may ask you to apply an ACE wrap or wear compression ([name]) stockings to help to reduce swelling.  Reduce your risk of vascular disease  Stop smoking. If you would like help call [facility] at 1-800-QUIT-NOW (915)595-4174) or [place] at 873-292-7909.  . Manage your cholesterol . Maintain a desired weight . Control your diabetes weight . Control your diabetes . Keep your blood pressure down .  If you have any questions, please call the office at [phone]  Disposition: skilled nursing facility  Patient's condition: is Fair  Follow up: 1. Dr. Donnetta Hutching in 3 weeks in [place] office   [place], [place] [facility] [phone] 03/27/2020  1:28 PM  - For VQI Registry use ---   Post-op:  Wound infection: No  Graft infection:  No  Transfusion: No    New Arrhythmia: No Ipsilateral amputation: No, [ ]  Minor, [ ]  BKA, [ ]  AKA Patency judged by: [x ] Dopper only, [ ]  D/C Ambulatory Status: Ambulatory with Assistance  Complications: MI: No, [ ]  Troponin only, [ ]  EKG or Clinical CHF: No Resp failure:No, [ ]  Pneumonia, [ ]  Ventilator Chg in renal function: No, [ ]  Inc. Cr > 0.5, [ ]  Temp. Dialysis,  [ ]  Permanent dialysis Stroke: No, [ ]  Minor, [ ]  Major Return to OR: No  Reason for return to OR: [ ]  Bleeding, [ ]  Infection, [ ]  Thrombosis, [ ]  Revision  Discharge medications: Statin use:  yes ASA use:  yes Plavix use:  no Beta blocker use: no CCB use:  No ACEI use:   no ARB use:  no Coumadin use: no

## 2020-03-21 NOTE — Progress Notes (Signed)
@  0030 Pt transferred via transporter (in bed) to HD. Report given to HD RN prior to transport.

## 2020-03-21 NOTE — Progress Notes (Signed)
Physical Therapy Treatment Patient Details Name: [name] MRN: [id number] DOB: [birth year] Today's Date: 03/21/2020    History of Present Illness 74 yo female with R foot wound has been admitted for R femoral endarterectomy and R fem-pop bypass graft. Orthostatic hypotension post surgery. PMHx:  2020 L fem to below knee pop bypass with saphenous vein, toe amputations, breast CA, L shoulder dislocation, SOB, CKD, CAD, NSTEMI, OA, gout    PT Comments    Pt supine in bed on arrival this session.  Pt required assistance to move OOB and into recliner.  Pt remains to present with dizziness and decreased in BPs with positional changes (18 point drop systolically) which continues to limit progress.  Plan for slower paced snf remains appropriate at this time.     BPs-orthostatic Supine:115/47 Sitting:101/55 Standing: 97/42   Follow Up Recommendations  SNF     Equipment Recommendations  Rolling walker with 5" wheels    Recommendations for Other Services       Precautions / Restrictions Precautions Precautions: Fall Precaution Comments: watch BP closely Required Braces or Orthoses: Other Brace Other Brace: darco shoe R LE Restrictions Weight Bearing Restrictions: Yes RLE Weight Bearing:  (toe weight bearing only.  MD reports to offset weight on heels.)    Mobility  Bed Mobility Overal bed mobility: Needs Assistance Bed Mobility: Supine to Sit     Supine to sit: Mod assist     General bed mobility comments: Mod assistance to advance LEs to edge of bed and elevate trunk into a seated position.  Pt required use of bed pad to scoot hips to edge of bed.  Transfers Overall transfer level: Needs assistance Equipment used: Rolling walker (2 wheeled) Transfers: Sit to/from [facility] Sit to Stand: Min assist;From elevated surface         General transfer comment: Min A for boosting into standing.  Cues for hand placement to and from seated surface,   assistance to control descent to seated surface.  Ambulation/Gait Ambulation/Gait assistance: Mod assist Gait Distance (Feet): 4 Feet Assistive device: Rolling walker (2 wheeled) Gait Pattern/deviations: Shuffle;Trunk flexed     General Gait Details: Steps away from bed and into the recliner.   Stairs             Wheelchair Mobility    Modified Rankin (Stroke Patients Only)       Balance Overall balance assessment: Needs assistance Sitting-balance support: Feet supported;No upper extremity supported Sitting balance-Leahy Scale: Good       Standing balance-Leahy Scale: Poor                              Cognition Arousal/Alertness: Awake/alert Behavior During Therapy: WFL for tasks assessed/performed Overall Cognitive Status: No family/caregiver present to determine baseline cognitive functioning                                 General Comments: Pt asking therapit's name multiple times during session.      Exercises      General Comments        Pertinent Vitals/Pain Pain Assessment: Faces Faces Pain Scale: Hurts a little bit Pain Location: R heel Pain Descriptors / Indicators: Operative site guarding;Guarding;Grimacing Pain Intervention(s): Monitored during session;Repositioned    Home Living  Prior Function            PT Goals (current goals can now be found in the care plan section) Acute Rehab PT Goals Patient Stated Goal: to get OOB. Potential to Achieve Goals: Good Progress towards PT goals: Progressing toward goals    Frequency    Min 2X/week      PT Plan Current plan remains appropriate;Frequency needs to be updated    Co-evaluation              AM-PAC PT "6 Clicks" Mobility   Outcome Measure  Help needed turning from your back to your side while in a flat bed without using bedrails?: A Little Help needed moving from lying on your back to sitting on the side of a  flat bed without using bedrails?: A Lot Help needed moving to and from a bed to a chair (including a wheelchair)?: A Little Help needed standing up from a chair using your arms (e.g., wheelchair or bedside chair)?: A Little Help needed to walk in hospital room?: A Lot Help needed climbing 3-5 steps with a railing? : A Lot 6 Click Score: 15    End of Session Equipment Utilized During Treatment: Gait belt Activity Tolerance: Patient limited by fatigue;Treatment limited secondary to medical complications (Comment) Patient left: with call bell/phone within reach;in chair;with chair alarm set Nurse Communication: Mobility status PT Visit Diagnosis: Unsteadiness on feet (R26.81);Muscle weakness (generalized) (M62.81);Pain Pain - Right/Left: Right Pain - part of body: Ankle and joints of foot     Time: 7340-3709 PT Time Calculation (min) (ACUTE ONLY): 27 min  Charges:  $Therapeutic Activity: 23-37 mins                     Erasmo Leventhal , PTA Acute Rehabilitation Services Pager [phone] Office [phone]     Jamaury Gumz Eli Hose 03/21/2020, 12:07 PM

## 2020-03-21 NOTE — Progress Notes (Signed)
Occupational Therapy Treatment Patient Details Name: [name] MRN: [id number] DOB: 1946/03/26 Today's Date: 03/21/2020    History of present illness 74 yo female with R foot wound has been admitted for R femoral endarterectomy and R fem-pop bypass graft. Orthostatic hypotension post surgery. PMHx:  2020 L fem to below knee pop bypass with saphenous vein, toe amputations, breast CA, L shoulder dislocation, SOB, CKD, CAD, NSTEMI, OA, gout   OT comments  Patient found in recliner, continues to present with the listed deficits impacting independence.  Spouse and patient are now hoping to qualify for CIR.  Currently she is needing up to Mod A for lower body ADL, setup and supervision for upper body ADL and Min A for transfers due to balance deficits.  Patient understands she needs ongoing rehab post acute, but is leaving that decision to her spouse.  OT to continue to follow in the acute setting to increase activity tolerance and ADL status.  CIR is recommended.    Follow Up Recommendations  CIR    Equipment Recommendations  Other (comment);3 in 1 bedside commode    Recommendations for Other Services      Precautions / Restrictions Precautions Precautions: Fall Precaution Comments: watch BP closely Other Brace: darco shoe R LE Restrictions Weight Bearing Restrictions: No       Mobility Bed Mobility   Bed Mobility: Sit to Supine     Supine to sit: Min assist        Transfers Overall transfer level: Needs assistance Equipment used: Rolling walker (2 wheeled) Transfers: Sit to/from [facility] Sit to Stand: Min assist;From elevated surface Stand pivot transfers: Min assist            Balance           Standing balance support: Bilateral upper extremity supported;During functional activity Standing balance-Leahy Scale: Poor Standing balance comment: relies on UE support for balance                           ADL either performed or  assessed with clinical judgement   ADL       Grooming: Wash/dry hands;Wash/dry face;Oral care;Set up;Sitting               Lower Body Dressing: Moderate assistance;Sit to/from stand               Functional mobility during ADLs: Minimal assistance;Rolling walker                         Cognition Arousal/Alertness: Awake/alert Behavior During Therapy: WFL for tasks assessed/performed Overall Cognitive Status: Impaired/Different from baseline Area of Impairment: Following commands;Problem solving                       Following Commands: Follows one step commands with increased time     Problem Solving: Requires verbal cues;Slow processing                      General Comments  BP sit 131/54, and in stand 125/54.      Pertinent Vitals/ Pain       Pain Assessment: Faces Faces Pain Scale: Hurts a little bit Pain Location: R heel Pain Descriptors / Indicators: Operative site guarding;Guarding;Grimacing  Frequency  Min 2X/week        Progress Toward Goals  OT Goals(current goals can now be found in the care plan section)  Progress towards OT goals: Progressing toward goals  Acute Rehab OT Goals Patient Stated Goal: I hope I'm getting better OT Goal Formulation: With patient Time For Goal Achievement: 04/01/20 Potential to Achieve Goals: [place]  Plan Discharge plan needs to be updated    Co-evaluation                 AM-PAC OT "6 Clicks" Daily Activity     Outcome Measure   Help from another person eating meals?: None Help from another person taking care of personal grooming?: A Little Help from another person toileting, which includes using toliet, bedpan, or urinal?: A Little Help from another person bathing (including washing, rinsing, drying)?: A Lot Help from another person to put on and taking off regular upper body clothing?: A  Little Help from another person to put on and taking off regular lower body clothing?: A Lot 6 Click Score: 17    End of Session Equipment Utilized During Treatment: Rolling walker;Gait belt  OT Visit Diagnosis: Unsteadiness on feet (R26.81);Muscle weakness (generalized) (M62.81)   Activity Tolerance Patient tolerated treatment well   Patient Left in bed;with call bell/phone within reach;with bed alarm set   Nurse Communication Mobility status;Precautions        Time: 1639-1700 OT Time Calculation (min): 21 min  Charges: OT General Charges $OT Visit: 1 Visit OT Treatments $Self Care/Home Management : 8-22 mins  03/21/2020  Rich, OTR/L  Acute Rehabilitation Services  Office:  Anon Raices 03/21/2020, 5:28 PM

## 2020-03-22 LAB — RENAL FUNCTION PANEL
Albumin: 2.9 g/dL — ABNORMAL LOW (ref 3.5–5.0)
Anion gap: 13 (ref 5–15)
BUN: 41 mg/dL — ABNORMAL HIGH (ref 8–23)
CO2: 27 mmol/L (ref 22–32)
Calcium: 9.3 mg/dL (ref 8.9–10.3)
Chloride: 95 mmol/L — ABNORMAL LOW (ref 98–111)
Creatinine, Ser: 6.24 mg/dL — ABNORMAL HIGH (ref 0.44–1.00)
GFR, Estimated: 7 mL/min — ABNORMAL LOW (ref >60)
Glucose, Bld: 139 mg/dL — ABNORMAL HIGH (ref 70–99)
Phosphorus: 4.2 mg/dL (ref 2.5–4.6)
Potassium: 4.1 mmol/L (ref 3.5–5.1)
Sodium: 135 mmol/L (ref 135–145)

## 2020-03-22 LAB — CBC
HCT: 25 % — ABNORMAL LOW (ref 36.0–46.0)
Hemoglobin: 8.3 g/dL — ABNORMAL LOW (ref 12.0–15.0)
MCH: 33.1 pg (ref 26.0–34.0)
MCHC: 33.2 g/dL (ref 30.0–36.0)
MCV: 99.6 fL (ref 80.0–100.0)
Platelets: 178 10*3/uL (ref 150–400)
RBC: 2.51 MIL/uL — ABNORMAL LOW (ref 3.87–5.11)
RDW: 17.4 % — ABNORMAL HIGH (ref 11.5–15.5)
WBC: 8.1 10*3/uL (ref 4.0–10.5)
nRBC: 1.4 % — ABNORMAL HIGH (ref 0.0–0.2)

## 2020-03-22 LAB — GLUCOSE, CAPILLARY
Glucose-Capillary: 145 mg/dL — ABNORMAL HIGH (ref 70–99)
Glucose-Capillary: 137 mg/dL — ABNORMAL HIGH (ref 70–99)
Glucose-Capillary: 125 mg/dL — ABNORMAL HIGH (ref 70–99)
Glucose-Capillary: 124 mg/dL — ABNORMAL HIGH (ref 70–99)

## 2020-03-22 MED ORDER — PENTAFLUOROPROP-TETRAFLUOROETH EX AERO: 1.0000 "application " | INHALATION_SPRAY | CUTANEOUS | Status: DC | PRN

## 2020-03-22 MED ORDER — SODIUM CHLORIDE 0.9 % IV SOLN: 100.0000 mL | INTRAVENOUS | Status: DC | PRN

## 2020-03-22 MED ORDER — POLYETHYLENE GLYCOL 3350 17 G PO PACK
17.0000 g | PACK | Freq: Every day | ORAL | Status: DC
  Administered 2020-03-22 – 2020-03-27 (×6): 17 g via ORAL
  Filled 2020-03-22 (×6): qty 1

## 2020-03-22 MED ORDER — ALTEPLASE 2 MG IJ SOLR: 2.0000 mg | Freq: Once | INTRAMUSCULAR | Status: DC | PRN

## 2020-03-22 MED ORDER — HEPARIN SODIUM (PORCINE) 1000 UNIT/ML DIALYSIS: 1000.0000 [IU] | INTRAMUSCULAR | Status: DC | PRN

## 2020-03-22 MED ORDER — MORPHINE SULFATE (PF) 2 MG/ML IV SOLN
INTRAVENOUS | Status: AC
  Filled 2020-03-22: qty 1

## 2020-03-22 MED ORDER — HEPARIN SODIUM (PORCINE) 1000 UNIT/ML DIALYSIS: 2000.0000 [IU] | Freq: Once | INTRAMUSCULAR | Status: DC

## 2020-03-22 MED ORDER — LIDOCAINE-PRILOCAINE 2.5-2.5 % EX CREA: 1.0000 "application " | TOPICAL_CREAM | CUTANEOUS | Status: DC | PRN

## 2020-03-22 MED ORDER — SODIUM CHLORIDE 0.9 % IV SOLN
100.0000 mg | INTRAVENOUS | Status: DC
  Administered 2020-03-22 – 2020-03-27 (×3): 100 mg via INTRAVENOUS
  Filled 2020-03-22 (×6): qty 5

## 2020-03-22 MED ORDER — DOXERCALCIFEROL 4 MCG/2ML IV SOLN
INTRAVENOUS | Status: AC
  Filled 2020-03-22: qty 2

## 2020-03-22 MED ORDER — LIDOCAINE HCL (PF) 1 % IJ SOLN: 5.0000 mL | INTRAMUSCULAR | Status: DC | PRN

## 2020-03-22 NOTE — TOC Progression Note (Signed)
Transition of Care [facility]) - Progression Note    Patient Details  Name: [name] MRN: [id number] Date of Birth: 1945-07-22  Transition of Care [facility]) CM/SW [place], [place] Phone Number: 03/22/2020, 1:29 PM  Clinical Narrative:     CSW spoke with patient's spouse-informed per RNCM, no bed available at [facility] inpatient rehab at this time. He recognize the need for patient to have therapy and remains agreeable to SNF([facility])as back up plan. CSW verbally gave other bed offers. He expressed he concerns about the ratings of the facilities and the care patient would receive. CSW acknowledges his concerns. CSW explained patient need for outpatient dialysis provided limited options because not all SNFs  provides transportation to out patient dialysis clinic- he states understanding. CSW explained [facility] may not be able to accommodate because they do not have driver on Saturdays to provide transportation. He states understanding.  CSW left voice message for [facility] to call CSW. CSW will continue to follow and assist with discharge discharge planning.  Thurmond Butts, MSW, LCSW Clinical Social Worker     Expected Discharge Plan: Home w Home Health Services Barriers to Discharge: Continued Medical Work up  Expected Discharge Plan and Services Expected Discharge Plan: [place] In-house Referral: Clinical Social Work                                             Social Determinants of Health (SDOH) Interventions    Readmission Risk Interventions Readmission Risk Prevention Plan 03/18/2020  Transportation Screening Complete  HRI or Home Care Consult Complete  SW Recovery Care/Counseling Consult Complete  Palliative Care Screening Not Applicable  Skilled Nursing Facility Complete  Some recent data might be hidden

## 2020-03-22 NOTE — TOC Progression Note (Signed)
Transition of Care (TOC) - Progression Note  Marvetta Gibbons RN, BSN Transitions of Care Unit 4E- RN Case Manager See Treatment Team for direct phone #    Patient Details  Name: [name] MRN: [id number] Date of Birth: 1945/10/06  Transition of Care [facility]) CM/SW Contact  Dahlia Client, Romeo Rabon, RN Phone Number: 03/22/2020, 1:43 PM  Clinical Narrative:    Per spouse request reached out to [facility] INPT rehab- they currently do not have any bed availability- and will not have any beds until next Tuesday at the earliest. If patient is medically stable for transition will need to look at other options such at [facility] vs Home with [facility] if there are no INPT rehab beds available.     Expected Discharge Plan: [place] Barriers to Discharge: Continued Medical Work up  Expected Discharge Plan and Services Expected Discharge Plan: [place] In-house Referral: Clinical Social Work                                             Social Determinants of Health (SDOH) Interventions    Readmission Risk Interventions Readmission Risk Prevention Plan 03/18/2020  Transportation Screening Complete  HRI or Home Care Consult Complete  SW Recovery Care/Counseling Consult Complete  Palliative Care Screening Not Applicable  Skilled Nursing Facility Complete  Some recent data might be hidden

## 2020-03-22 NOTE — Progress Notes (Signed)
Subjective: Seen on hemodialysis, tolerating UF, complains of some constipation usually happens when takes pain meds, will add MiraLAX   Objective Vital signs in last 24 hours: Vitals:   03/22/20 0931 03/22/20 0936 03/22/20 0941 03/22/20 0945  BP: (!) 106/41   (!) 110/42  Pulse:      Resp: 11 10 10 10   Temp:      TempSrc:      SpO2:      Weight:      Height:       Weight change:   Physical Exam General: Alert on HD WDWN female in NAD Heart:RRR, no mrg Lungs:CTAB Abdomen:soft, NTND Extremities:no LE edema, L foot dressed Dialysis Access: RU AVF patent on HD  Dialysis Orders: TTS -NW 3h 38min   350/ 800    70kg  2K/2Ca bath  Hep 2500  R AVF P2 Mircera4mcg q4wks - last11/4 Hectorol48mcg IV qHD  Problem/Plan: 1. Non healing ulcer R foot/critical limb ischemia- s/pDr. Lonn [place] external iliac &common fem endarterectomy w/darcon patch angioplasty and R fem to anterior tibial bypass12/10. PlanPer VVS, noted possible CIR admit 2. ESRD- HD TTS. HD today on schedule. OK to resume hep w/ HD.   3.  HTN /volume- BP's low / normal, asymptomatic no BP lowering meds, under dry 4. Anemiaof CKD- Hgb 7.1 > 8.3 this a.m. postsurgery,s/p1 unit pRBCon 12/10. ContAranesp 60 mcgqSat. tsat 24%. Give short iron course. Recheck CBC.  5. Secondary Hyperparathyroidism -Caandphos in goal close to goal. Continue VDRA and binders-3 renvela  6. Nutrition- Renal diet w/ fluid restrictions.  7. Hx CAD 8. Systolic/diastolic HF  Ernest Haber, PA-C [facility] Beeper [phone] 03/22/2020,10:04 AM  LOS: 6 days   Labs: Basic Metabolic Panel: Recent Labs  Lab 03/17/20 0943 03/18/20 1852 03/22/20 0814  NA 136 135 135  K 3.8 4.0 4.1  CL 98 96* 95*  CO2 26 26 27   GLUCOSE 225* 125* 139*  BUN 39* 38* 41*  CREATININE 4.26* 4.65* 6.24*  CALCIUM 7.9* 8.5* 9.3  PHOS 5.7* 5.2* 4.2   Liver Function Tests: Recent Labs  Lab 03/17/20 0943  03/18/20 1852 03/22/20 0814  ALBUMIN 3.0* 2.7* 2.9*   No results for input(s): LIPASE, AMYLASE in the last 168 hours. No results for input(s): AMMONIA in the last 168 hours. CBC: Recent Labs  Lab 03/16/20 1531 03/16/20 2333 03/17/20 0943 03/19/20 1319 03/22/20 0814  WBC 7.7 6.8 7.8 6.7 8.1  HGB 8.2* 7.5* 8.2* 8.3* 8.3*  HCT 24.1* 21.6* 25.7* 24.1* 25.0*  MCV 96.0 96.0 98.5 97.2 99.6  PLT 113* 118* 143* 166 178   Cardiac Enzymes: No results for input(s): CKTOTAL, CKMB, CKMBINDEX, TROPONINI in the last 168 hours. CBG: Recent Labs  Lab 03/21/20 0608 03/21/20 1113 03/21/20 1612 03/21/20 2110 03/22/20 0549  GLUCAP 122* 128* 133* 137* 145*    Studies/Results: No results found. Medications: . sodium chloride    . sodium chloride    . magnesium sulfate bolus IVPB     . allopurinol  100 mg Oral BID  . atorvastatin  10 mg Oral Daily  . Chlorhexidine Gluconate Cloth  6 each Topical Q0600  . darbepoetin (ARANESP) injection - DIALYSIS  60 mcg Intravenous Q Sat-HD  . docusate sodium  100 mg Oral BID  . doxercalciferol  3 mcg Intravenous Q T,Th,Sa-HD  . [START ON 03/23/2020] heparin  2,000 Units Dialysis Once in dialysis  . heparin  5,000 Units Subcutaneous Q8H  . insulin aspart  0-15 Units Subcutaneous TID WC  .  isosorbide dinitrate  10 mg Oral TID  . sevelamer carbonate  2,400 mg Oral TID WC  . traZODone  25 mg Oral QHS

## 2020-03-22 NOTE — Plan of Care (Signed)
?  Problem: Clinical Measurements: ?Goal: Will remain free from infection ?Outcome: Progressing ?  ?

## 2020-03-22 NOTE — Progress Notes (Signed)
Mobility Specialist - Progress Note   03/22/20 1258  Mobility  Activity Ambulated in room  Level of Assistance Minimal assist, patient does 75% or more  Assistive Device Front wheel walker  Distance Ambulated (ft) 6 ft  Mobility Response Tolerated poorly  Mobility performed by Mobility specialist  $Mobility charge 1 Mobility    Pre-mobility: 87 HR, 97%SpO2 Post-mobility: 83 HR, 97% SpO2  Pt min assist to stand from bed. After 6 ft she had to sit down due to fatigue. She did not want to walk back to bed so she was scooted to the bed and pivoted into it. Min assist for pivot and elevating legs when laying down.   Pricilla Handler Mobility Specialist Mobility Specialist Phone: [phone]

## 2020-03-22 NOTE — Progress Notes (Signed)
Inpatient Rehabilitation Admissions Coordinator   Inpatient rehab consult received. I met with patient at bedside as well as spoke with her spouse by phone. I am unlikely to have CIR bed to admit this patient to this week. He is asking for her to remain in house until bed is available and I explained that I could not guarantee her a bed. He is reluctant for her to go to SNF , but would like SW to call him and to also look into [place] [facility] for a possible admit. I will follow up if bed becomes available. I have alerted acute team and TOC.  Danne Baxter, RN, MSN Rehab Admissions Coordinator [phone] 03/22/2020 11:48 AM

## 2020-03-22 NOTE — Progress Notes (Addendum)
Progress Note    03/22/2020 7:31 AM 6 Days Post-Op  Subjective:  Doing okay this morning  Afebrile HR 60's-80's NSR [id number]'I systolic 95% [id number]  Vitals:   03/21/20 2310 03/22/20 0456  BP: (!) 121/48 (!) 115/49  Pulse: 69 76  Resp: 10 12  Temp: 98.5 F (36.9 C) 98.6 F (37 C)  SpO2: 99% 91%    Physical Exam: Cardiac:  Regular Lungs:  Non labored Incisions:  Right BK incision is clean and dry-pt sitting up and eating so did not look at right groin this morning before going to HD. Extremities:  Brisk right AT and PT doppler signals   CBC    Component Value Date/Time   WBC 6.7 03/19/2020 1319   RBC 2.48 (L) 03/19/2020 1319   HGB 8.3 (L) 03/19/2020 1319   HGB 11.7 05/18/2017 1218   HGB 12.3 02/02/2017 1136   HCT 24.1 (L) 03/19/2020 1319   HCT 34.7 05/18/2017 1218   HCT 37.4 02/02/2017 1136   PLT 166 03/19/2020 1319   PLT 227 05/18/2017 1218   MCV 97.2 03/19/2020 1319   MCV 87 05/18/2017 1218   MCV 90.6 02/02/2017 1136   MCH 33.5 03/19/2020 1319   MCHC 34.4 03/19/2020 1319   RDW 16.2 (H) 03/19/2020 1319   RDW 16.0 (H) 05/18/2017 1218   RDW 15.7 (H) 02/02/2017 1136   LYMPHSABS 1,278 02/13/2020 1518   LYMPHSABS 1.8 02/02/2017 1136   MONOABS 0.8 04/18/2019 0631   MONOABS 0.8 02/02/2017 1136   EOSABS 80 02/13/2020 1518   EOSABS 0.3 02/02/2017 1136   BASOSABS 22 02/13/2020 1518   BASOSABS 0.1 02/02/2017 1136    BMET    Component Value Date/Time   NA 135 03/18/2020 1852   NA 143 05/18/2017 1218   NA 143 02/02/2017 1137   K 4.0 03/18/2020 1852   K 3.8 02/02/2017 1137   CL 96 (L) 03/18/2020 1852   CO2 26 03/18/2020 1852   CO2 24 02/02/2017 1137   GLUCOSE 125 (H) 03/18/2020 1852   GLUCOSE 74 02/02/2017 1137   BUN 38 (H) 03/18/2020 1852   BUN 76 (HH) 05/18/2017 1218   BUN 63.4 (H) 02/02/2017 1137   CREATININE 4.65 (H) 03/18/2020 1852   CREATININE 5.12 (H) 02/13/2020 1518   CREATININE 3.0 (HH) 02/02/2017 1137   CALCIUM 8.5 (L) 03/18/2020 1852    CALCIUM 9.9 02/02/2017 1137   GFRNONAA 9 (L) 03/18/2020 1852   GFRNONAA 8 (L) 02/13/2020 1518   GFRAA 9 (L) 02/13/2020 1518    INR    Component Value Date/Time   INR 1.2 03/14/2020 1421     Intake/Output Summary (Last 24 hours) at 03/22/2020 0731 Last data filed at 03/21/2020 2326 Gross per 24 hour  Intake 480 ml  Output --  Net 480 ml     Assessment:  74 y.o. female is s/p:  #1 right external iliac and common femoral endarterectomy with Dacron patch angioplasty, #2 right femoral to anterior tibial bypass with 6 mm ringed propatent Gore-Tex graft   6 Days Post-Op  Plan: -pt continues to have brisk doppler signals right AT/PT -right below knee incision is healing nicely; did not look at right groin this morning as she was sitting up eating trying to finish before going to dialysis. Continue dry gauze to right groin. -CIR consult placed yesterday-awaiting disposition. -DVT prophylaxis:  Sq heparin   Leontine Locket, PA-C [facility] [phone]  VASCULAR STAFF ADDENDUM: I agree with the above.  In HD on  rounds. Needs to mobilize.  Discharge planning.  Yevonne Aline. Stanford Breed, MD [facility] Phone Number: [phone] 03/22/2020 6:55 PM    03/22/2020 7:31 AM

## 2020-03-23 LAB — GLUCOSE, CAPILLARY
Glucose-Capillary: 144 mg/dL — ABNORMAL HIGH (ref 70–99)
Glucose-Capillary: 125 mg/dL — ABNORMAL HIGH (ref 70–99)
Glucose-Capillary: 159 mg/dL — ABNORMAL HIGH (ref 70–99)
Glucose-Capillary: 140 mg/dL — ABNORMAL HIGH (ref 70–99)

## 2020-03-23 MED ORDER — CHLORHEXIDINE GLUCONATE CLOTH 2 % EX PADS
6.0000 | MEDICATED_PAD | Freq: Every day | CUTANEOUS | Status: DC
  Administered 2020-03-24 – 2020-03-27 (×4): 6 via TOPICAL

## 2020-03-23 NOTE — Plan of Care (Signed)
  Problem: Activity: Goal: Risk for activity intolerance will decrease Outcome: Not Progressing   

## 2020-03-23 NOTE — Progress Notes (Addendum)
[facility] [place]  Subjective  - No big changes, no new complaints.   Objective (!) 114/52 68 98.4 F (36.9 C) (Oral) 13 98%  Intake/Output Summary (Last 24 hours) at 03/23/2020 0746 Last data filed at 03/22/2020 2240 Gross per 24 hour  Intake 105.84 ml  Output 1000 ml  Net -894.16 ml    Brisk PT/at signal right groin with dry guaze healing well without hematoma Right BK incision healing well Lungs non labored breathing   Assessment/Planning: POD # 7 74 y.o. female is s/p:  #1 right external iliac and common femoral endarterectomy with Dacron patch angioplasty, #2 right femoral to anterior tibial bypass with 6 mm ringed propatent Gore-Tex graft   Pending discharge to rehab Stable disposition   Roxy Horseman 03/23/2020 7:46 AM --  Laboratory Lab Results: Recent Labs    03/22/20 0814  WBC 8.1  HGB 8.3*  HCT 25.0*  PLT 178   BMET Recent Labs    03/22/20 0814  NA 135  K 4.1  CL 95*  CO2 27  GLUCOSE 139*  BUN 41*  CREATININE 6.24*  CALCIUM 9.3    COAG Lab Results  Component Value Date   INR 1.2 03/14/2020   INR 1.1 03/28/2019   INR 1.2 03/23/2019   No results found for: PTT  VASCULAR STAFF ADDENDUM: I have independently interviewed and examined the patient. I agree with the above.  Ready for next level of care.  Yevonne Aline. Stanford Breed, MD [facility] Phone Number: [phone] 03/23/2020 8:34 AM

## 2020-03-23 NOTE — Progress Notes (Signed)
Renal Navigator called [facility] who has been trying to rearrange clinic schedules in order to accommodate patient on a MWF schedule so she can discharge to [place] is needed. Unfortunately, he cannot provide patient with a MWF schedule at this time. He states he will let Navigator know if anything changes and he can accommodate.  Secure message sent to team.  Alphonzo Cruise, [place] Renal Navigator [phone]

## 2020-03-23 NOTE — Progress Notes (Signed)
[name] Progress Note   Subjective:   Patient seen and examined at bedside in room.  Feeling groggy but otherwise ok.  Waiting to find out plan for placement. No current beds in CIR.  Denies CP, SOB, n/v, weakness and confusion.  Denies pain in RLE.  Objective Vitals:   03/22/20 2355 03/23/20 0338 03/23/20 0800 03/23/20 1110  BP: (!) 106/41 (!) 114/52 (!) 132/56 (!) 138/55  Pulse: 73 68 80 81  Resp: 16 13 18 20   Temp: 98.8 F (37.1 C) 98.4 F (36.9 C) 98.8 F (37.1 C) 98.6 F (37 C)  TempSrc: Oral Oral Oral Oral  SpO2: 99% 98% 100% 100%  Weight:      Height:       Physical Exam General:WDWN female in NAD Heart:RRR, no mrg Lungs:CTAB, nml WOB Abdomen:soft, NTND Extremities:trace edema on R - R foot dressed, no LLE edema Dialysis Access: LU AVF +b   Filed Weights   03/21/20 0345 03/22/20 0748 03/22/20 1110  Weight: 68.6 kg 72.9 kg 71.2 kg    Intake/Output Summary (Last 24 hours) at 03/23/2020 1240 Last data filed at 03/22/2020 2240 Gross per 24 hour  Intake 105.84 ml  Output --  Net 105.84 ml    Additional Objective Labs: Basic Metabolic Panel: Recent Labs  Lab 03/17/20 0943 03/18/20 1852 03/22/20 0814  NA 136 135 135  K 3.8 4.0 4.1  CL 98 96* 95*  CO2 26 26 27   GLUCOSE 225* 125* 139*  BUN 39* 38* 41*  CREATININE 4.26* 4.65* 6.24*  CALCIUM 7.9* 8.5* 9.3  PHOS 5.7* 5.2* 4.2   Liver Function Tests: Recent Labs  Lab 03/17/20 0943 03/18/20 1852 03/22/20 0814  ALBUMIN 3.0* 2.7* 2.9*   CBC: Recent Labs  Lab 03/16/20 1531 03/16/20 2333 03/17/20 0943 03/19/20 1319 03/22/20 0814  WBC 7.7 6.8 7.8 6.7 8.1  HGB 8.2* 7.5* 8.2* 8.3* 8.3*  HCT 24.1* 21.6* 25.7* 24.1* 25.0*  MCV 96.0 96.0 98.5 97.2 99.6  PLT 113* 118* 143* 166 178   Blood Culture    Component Value Date/Time   SDES BLOOD BLOOD LEFT FOREARM 03/23/2019 1538   SPECREQUEST  03/23/2019 1538    BOTTLES DRAWN AEROBIC AND ANAEROBIC Blood Culture results may not be  optimal due to an excessive volume of blood received in culture bottles   CULT  03/23/2019 1538    NO GROWTH 5 DAYS Performed at [facility], [address], [place], [place] [address]    REPTSTATUS 03/28/2019 FINAL 03/23/2019 1538   CBG: Recent Labs  Lab 03/22/20 1208 03/22/20 1635 03/22/20 2124 03/23/20 0552 03/23/20 1107  GLUCAP 137* 125* 124* 144* 125*   Medications: . iron sucrose 100 mg (03/22/20 1651)  . magnesium sulfate bolus IVPB     . allopurinol  100 mg Oral BID  . atorvastatin  10 mg Oral Daily  . Chlorhexidine Gluconate Cloth  6 each Topical Q0600  . darbepoetin (ARANESP) injection - DIALYSIS  60 mcg Intravenous Q Sat-HD  . docusate sodium  100 mg Oral BID  . doxercalciferol  3 mcg Intravenous Q T,Th,Sa-HD  . heparin  5,000 Units Subcutaneous Q8H  . insulin aspart  0-15 Units Subcutaneous TID WC  . isosorbide dinitrate  10 mg Oral TID  . polyethylene glycol  17 g Oral Daily  . sevelamer carbonate  2,400 mg Oral TID WC  . traZODone  25 mg Oral QHS    Dialysis Orders: TTS -NW 3h 72min 350/ 800 70kg 2K/2Ca  bath Hep 2500 R AVF P2 Mircera42mcg q4wks - last11/4 Hectorol72mcg IV qHD  Problem/Plan: 1. Non healing ulcer R foot/critical limb ischemia- s/pDr. Lonn [place] external iliac &common fem endarterectomy w/darcon patch angioplasty and R fem to anterior tibial bypass12/10. PlanPer VVS. 2. ESRD- HD TTS. HD today on schedule.OK to resume hep w/ HD.  3. HTN/volume-BP stable. no BP lowering meds, under dry, will need to lower on d/c.  4. Anemiaof CKD- Hgb 7.1 > 8. Yesterday. drop postsurgery s/p1 unit pRBCon 12/10. ContAranesp 60 mcgqSat. tsat 24%. Give short iron course. Recheck CBC.  5. Secondary Hyperparathyroidism -Caandphos in goal. Continue VDRA and binders-3 renvela  6. Nutrition- Renal diet w/ fluid restrictions.  7. Hx CAD 8. Systolic/diastolic HF 9. Dispo - ok for dc from renal standpoint.  SW  working on placement. No beds in CIR.   Jen Mow, PA-C [facility] 03/23/2020,12:40 PM  LOS: 7 days

## 2020-03-23 NOTE — Progress Notes (Signed)
Mobility Specialist - Progress Note   03/23/20 1243  Mobility  Activity Ambulated in room  Level of Assistance Minimal assist, patient does 75% or more  Assistive Device Front wheel walker  Distance Ambulated (ft) 16 ft  Mobility Response Tolerated well  Mobility performed by Mobility specialist  $Mobility charge 1 Mobility   Pt min assist to stand from bed w/ bed height raised. Asx throughout ambulation. Pt back into bed to eat lunch. VSS.   Pricilla Handler Mobility Specialist Mobility Specialist Phone: [phone]

## 2020-03-23 NOTE — Care Management Important Message (Signed)
Important Message  Patient Details  Name: CATERINE MCMEANS MRN: [id number] Date of Birth: 01/28/1946   Medicare Important Message Given:  Yes     [name] 03/23/2020, 9:57 AM

## 2020-03-23 NOTE — TOC Progression Note (Addendum)
Transition of Care [facility]) - Progression Note    Patient Details  Name: [name] MRN: [id number] Date of Birth: 1945-04-28  Transition of Care [facility]) CM/SW [place], [place] Phone Number: 03/23/2020, 5:39 PM  Clinical Narrative:     CSW spoke with patient's spouse,[name]- CSW explained decision has to be made, patient is stable for discharge. [facility] was unable to accommodate dialysis schedule. Renal Navigator CSW [place], unable to have schedule changed at out patient clinic. He expressed he was not happy with most of the SNFs and their ratings but requested to contact [facility] and then [place].   CSW contacted [facility] w/ Estill [facility]. CSW contacted [facility] -let voice message w/ Admissions CSW will request weekend CSW follow up with SNF choices.  CSW will continue to follow and assist with discharge planning.  Thurmond Butts, MSW, LCSW Clinical Social Worker   Expected Discharge Plan: Home w Home Health Services Barriers to Discharge: Continued Medical Work up  Expected Discharge Plan and Services Expected Discharge Plan: [place] In-house Referral: Clinical Social Work                                             Social Determinants of Health (SDOH) Interventions    Readmission Risk Interventions Readmission Risk Prevention Plan 03/18/2020  Transportation Screening Complete  HRI or Home Care Consult Complete  SW Recovery Care/Counseling Consult Complete  Palliative Care Screening Not Applicable  Skilled Nursing Facility Complete  Some recent data might be hidden

## 2020-03-24 LAB — RENAL FUNCTION PANEL
Albumin: 2.8 g/dL — ABNORMAL LOW (ref 3.5–5.0)
Anion gap: 13 (ref 5–15)
BUN: 38 mg/dL — ABNORMAL HIGH (ref 8–23)
CO2: 26 mmol/L (ref 22–32)
Calcium: 10.1 mg/dL (ref 8.9–10.3)
Chloride: 93 mmol/L — ABNORMAL LOW (ref 98–111)
Creatinine, Ser: 6.18 mg/dL — ABNORMAL HIGH (ref 0.44–1.00)
GFR, Estimated: 7 mL/min — ABNORMAL LOW (ref >60)
Glucose, Bld: 151 mg/dL — ABNORMAL HIGH (ref 70–99)
Phosphorus: 4.9 mg/dL — ABNORMAL HIGH (ref 2.5–4.6)
Potassium: 4.7 mmol/L (ref 3.5–5.1)
Sodium: 132 mmol/L — ABNORMAL LOW (ref 135–145)

## 2020-03-24 LAB — CBC
HCT: 25.4 % — ABNORMAL LOW (ref 36.0–46.0)
Hemoglobin: 8.5 g/dL — ABNORMAL LOW (ref 12.0–15.0)
MCH: 33.6 pg (ref 26.0–34.0)
MCHC: 33.5 g/dL (ref 30.0–36.0)
MCV: 100.4 fL — ABNORMAL HIGH (ref 80.0–100.0)
Platelets: 169 10*3/uL (ref 150–400)
RBC: 2.53 MIL/uL — ABNORMAL LOW (ref 3.87–5.11)
RDW: 18.6 % — ABNORMAL HIGH (ref 11.5–15.5)
WBC: 6.5 10*3/uL (ref 4.0–10.5)
nRBC: 2.3 % — ABNORMAL HIGH (ref 0.0–0.2)

## 2020-03-24 LAB — GLUCOSE, CAPILLARY
Glucose-Capillary: 142 mg/dL — ABNORMAL HIGH (ref 70–99)
Glucose-Capillary: 145 mg/dL — ABNORMAL HIGH (ref 70–99)
Glucose-Capillary: 149 mg/dL — ABNORMAL HIGH (ref 70–99)
Glucose-Capillary: 122 mg/dL — ABNORMAL HIGH (ref 70–99)

## 2020-03-24 MED ORDER — LIDOCAINE-PRILOCAINE 2.5-2.5 % EX CREA
1.0000 "application " | TOPICAL_CREAM | CUTANEOUS | Status: DC | PRN
  Filled 2020-03-24: qty 5

## 2020-03-24 MED ORDER — DARBEPOETIN ALFA 60 MCG/0.3ML IJ SOSY
PREFILLED_SYRINGE | INTRAMUSCULAR | Status: AC
  Administered 2020-03-24: 60 ug via INTRAVENOUS
  Filled 2020-03-24: qty 0.3

## 2020-03-24 MED ORDER — PENTAFLUOROPROP-TETRAFLUOROETH EX AERO: 1.0000 "application " | INHALATION_SPRAY | CUTANEOUS | Status: DC | PRN

## 2020-03-24 MED ORDER — OXYCODONE-ACETAMINOPHEN 5-325 MG PO TABS
ORAL_TABLET | ORAL | Status: AC
  Administered 2020-03-24: 1 via ORAL
  Filled 2020-03-24: qty 1

## 2020-03-24 MED ORDER — LIDOCAINE HCL (PF) 1 % IJ SOLN: 5.0000 mL | INTRAMUSCULAR | Status: DC | PRN

## 2020-03-24 MED ORDER — ALTEPLASE 2 MG IJ SOLR: 2.0000 mg | Freq: Once | INTRAMUSCULAR | Status: DC | PRN

## 2020-03-24 MED ORDER — SODIUM CHLORIDE 0.9 % IV SOLN: 100.0000 mL | INTRAVENOUS | Status: DC | PRN

## 2020-03-24 MED ORDER — HEPARIN SODIUM (PORCINE) 1000 UNIT/ML DIALYSIS
1000.0000 [IU] | INTRAMUSCULAR | Status: DC | PRN
  Filled 2020-03-24: qty 1

## 2020-03-24 MED ORDER — DOXERCALCIFEROL 4 MCG/2ML IV SOLN
INTRAVENOUS | Status: AC
  Administered 2020-03-24: 3 ug via INTRAVENOUS
  Filled 2020-03-24: qty 2

## 2020-03-24 NOTE — TOC Progression Note (Signed)
Transition of Care [facility]) - Progression Note    Patient Details  Name: [name] MRN: [id number] Date of Birth: 07-Feb-1946  Transition of Care [facility] CM/SW [place], [place] Phone Number: [phone] 03/24/2020, 1:12 PM  Clinical Narrative:     CSW reached out to [facility] to check to see if they could accommodate her dialysis treatment. CSW left messages at both facility.  TOC team will continue to assist with discharge planning needs.  Expected Discharge Plan: [place] Barriers to Discharge: Continued Medical Work up  Expected Discharge Plan and Services Expected Discharge Plan: [place] In-house Referral: Clinical Social Work                                             Social Determinants of Health (SDOH) Interventions    Readmission Risk Interventions Readmission Risk Prevention Plan 03/18/2020  Transportation Screening Complete  HRI or Home Care Consult Complete  SW Recovery Care/Counseling Consult Complete  Palliative Care Screening Not Applicable  Skilled Nursing Facility Complete  Some recent data might be hidden

## 2020-03-24 NOTE — Progress Notes (Signed)
[facility] Progress Note   Subjective:   Patient seen and examined at bedside.  Complains of constipation.  Nurse giving miralax now.  No other specific complaints.  Waiting for bed placement.   Objective Vitals:   03/23/20 2323 03/24/20 0457 03/24/20 0750 03/24/20 1133  BP: (!) 114/53 127/61 134/63 (!) 119/41  Pulse: 70 66 80 79  Resp: 11 12 20 12   Temp: 98.4 F (36.9 C) 98.8 F (37.1 C) 98.7 F (37.1 C) 98.3 F (36.8 C)  TempSrc: Oral Oral Oral Oral  SpO2: 98% 100% 100% 97%  Weight:      Height:       Physical Exam General:WDWN female in NAD Heart:RRR, no mrg Lungs:CTAB, nml WOB on 1L o2 via [place] Abdomen:soft, NTD Extremities:no LLE edema, R foot dressed, trace edema Dialysis Access: LU AVF +b   Filed Weights   03/21/20 0345 03/22/20 0748 03/22/20 1110  Weight: 68.6 kg 72.9 kg 71.2 kg    Intake/Output Summary (Last 24 hours) at 03/24/2020 1136 Last data filed at 03/23/2020 2005 Gross per 24 hour  Intake 240 ml  Output --  Net 240 ml    Additional Objective Labs: Basic Metabolic Panel: Recent Labs  Lab 03/18/20 1852 03/22/20 0814 03/24/20 1051  NA 135 135 132*  K 4.0 4.1 4.7  CL 96* 95* 93*  CO2 26 27 26   GLUCOSE 125* 139* 151*  BUN 38* 41* 38*  CREATININE 4.65* 6.24* 6.18*  CALCIUM 8.5* 9.3 10.1  PHOS 5.2* 4.2 4.9*   Liver Function Tests: Recent Labs  Lab 03/18/20 1852 03/22/20 0814 03/24/20 1051  ALBUMIN 2.7* 2.9* 2.8*   CBC: Recent Labs  Lab 03/19/20 1319 03/22/20 0814 03/24/20 1051  WBC 6.7 8.1 6.5  HGB 8.3* 8.3* 8.5*  HCT 24.1* 25.0* 25.4*  MCV 97.2 99.6 100.4*  PLT 166 178 169   CBG: Recent Labs  Lab 03/23/20 1107 03/23/20 1609 03/23/20 2137 03/24/20 0601 03/24/20 1131  GLUCAP 125* 159* 140* 142* 145*    Medications: . sodium chloride    . sodium chloride    . iron sucrose 100 mg (03/22/20 1651)  . magnesium sulfate bolus IVPB     . allopurinol  100 mg Oral BID  . atorvastatin  10 mg Oral Daily   . Chlorhexidine Gluconate Cloth  6 each Topical Q0600  . Chlorhexidine Gluconate Cloth  6 each Topical Q0600  . darbepoetin (ARANESP) injection - DIALYSIS  60 mcg Intravenous Q Sat-HD  . docusate sodium  100 mg Oral BID  . doxercalciferol  3 mcg Intravenous Q T,Th,Sa-HD  . heparin  5,000 Units Subcutaneous Q8H  . insulin aspart  0-15 Units Subcutaneous TID WC  . isosorbide dinitrate  10 mg Oral TID  . polyethylene glycol  17 g Oral Daily  . sevelamer carbonate  2,400 mg Oral TID WC  . traZODone  25 mg Oral QHS    Dialysis Orders: TTS -NW 3h 44min 350/ 800 70kg 2K/2Ca bath Hep 2500 R AVF P2 Mircera39mcg q4wks - last11/4 Hectorol70mcg IV qHD  Problem/Plan: 1. Non healing ulcer R foot/critical limb ischemia- s/pDr. Lonn [place] external iliac &common fem endarterectomy w/darcon patch angioplasty and R fem to anterior tibial bypass12/10. PlanPer VVS. 2. ESRD- HD TTS. HDtoday per regular schedule.OK to resume heparin w/ HD.  3. HTN/volume-BP stable. no BP lowering meds, under dry, will need to lower on d/c.  4. Anemiaof CKD- Hgb 7.1>8.5. droppostsurgery s/p1 unit pRBCon 12/10. ContAranesp 60 mcgqSat. tsat 24%. Give short iron  course. Recheck CBC.  5. Secondary Hyperparathyroidism -Caandphos in goal. Continue VDRA and binders-3 renvela  6. Nutrition- Renal diet w/ fluid restrictions.  7. Hx CAD 8. Systolic/diastolic HF 9. Dispo - ok for dc from renal standpoint.  SW working on placement. No beds in [place], PA-C [place] 03/24/2020,11:36 AM  LOS: 8 days

## 2020-03-24 NOTE — Progress Notes (Signed)
[facility] [place]  Subjective  - no complaints.   Objective 134/63 80 98.7 F (37.1 C) (Oral) 20 100%  Intake/Output Summary (Last 24 hours) at 03/24/2020 1001 Last data filed at 03/23/2020 2005 Gross per 24 hour  Intake 240 ml  Output --  Net 240 ml    Right groin looks ok - a little separation at superior part of groin incision - dry dressing applied Brisk right PT signal  Laboratory Lab Results: Recent Labs    03/22/20 0814  WBC 8.1  HGB 8.3*  HCT 25.0*  PLT 178   BMET Recent Labs    03/22/20 0814  NA 135  K 4.1  CL 95*  CO2 27  GLUCOSE 139*  BUN 41*  CREATININE 6.24*  CALCIUM 9.3    COAG Lab Results  Component Value Date   INR 1.2 03/14/2020   INR 1.1 03/28/2019   INR 1.2 03/23/2019   No results found for: PTT  Assessment/Planning:  74 year old female status post a right femoral endarterectomy with a right femoral to anterior tibial bypass.  She has good Doppler signal this morning.  Need to keep a dry dressing in her right groin.  Awaiting SNF placement.  Marty Heck 03/24/2020 10:01 AM --

## 2020-03-24 NOTE — Progress Notes (Signed)
Mobility Specialist - Progress Note   03/24/20 1250  Mobility  Activity  (Cancel)   Per RN, pt is transporting to dialysis soon. Will f/u as able.   Pricilla Handler Mobility Specialist Mobility Specialist Phone: [phone]

## 2020-03-25 LAB — GLUCOSE, CAPILLARY
Glucose-Capillary: 110 mg/dL — ABNORMAL HIGH (ref 70–99)
Glucose-Capillary: 121 mg/dL — ABNORMAL HIGH (ref 70–99)
Glucose-Capillary: 127 mg/dL — ABNORMAL HIGH (ref 70–99)
Glucose-Capillary: 120 mg/dL — ABNORMAL HIGH (ref 70–99)
Glucose-Capillary: 120 mg/dL — ABNORMAL HIGH (ref 70–99)

## 2020-03-25 NOTE — Progress Notes (Signed)
Mobility Specialist - Progress Note   03/25/20 1114  Mobility  Activity Ambulated in room  Level of Assistance Minimal assist, patient does 75% or more  Assistive Device Front wheel walker  Distance Ambulated (ft) 16 ft  Mobility Response Tolerated well  Mobility performed by Mobility specialist  $Mobility charge 1 Mobility    Pre-mobility: 82 HR, 97% SpO2 During mobility: 93 HR Post-mobility: 80 HR, 93% SpO2  Pt min assist to stand from bed w/ bed height raised. Distance limited by weakness of her knees. Back in bed after walk.   Pricilla Handler Mobility Specialist Mobility Specialist Phone: [phone]

## 2020-03-25 NOTE — Progress Notes (Addendum)
Progress Note    03/25/2020 7:49 AM 9 Days Post-Op  Subjective:  No real complaints. Says she had a restful night. Some frustration with timing of HD interfering with PT sessions and yesterday was unable to work with mobility specialist due to nausea   Vitals:   03/25/20 0038 03/25/20 0617  BP: (!) 96/45 (!) 131/57  Pulse: 74 74  Resp: 15 17  Temp: 97.8 F (36.6 C) 97.8 F (36.6 C)  SpO2: 98% 95%   Physical Exam: Cardiac: regular Lungs:  Non labored Incisions: right groin incision is clean and dry. Very small superficial separation of superior aspect of groin incision. No drainage or erythema. Dry gauze reapplied. Brisk PT doppler signal Extremities: well perfused and warm Neurologic: alert and oriented  CBC    Component Value Date/Time   WBC 6.5 03/24/2020 1051   RBC 2.53 (L) 03/24/2020 1051   HGB 8.5 (L) 03/24/2020 1051   HGB 11.7 05/18/2017 1218   HGB 12.3 02/02/2017 1136   HCT 25.4 (L) 03/24/2020 1051   HCT 34.7 05/18/2017 1218   HCT 37.4 02/02/2017 1136   PLT 169 03/24/2020 1051   PLT 227 05/18/2017 1218   MCV 100.4 (H) 03/24/2020 1051   MCV 87 05/18/2017 1218   MCV 90.6 02/02/2017 1136   MCH 33.6 03/24/2020 1051   MCHC 33.5 03/24/2020 1051   RDW 18.6 (H) 03/24/2020 1051   RDW 16.0 (H) 05/18/2017 1218   RDW 15.7 (H) 02/02/2017 1136   LYMPHSABS 1,278 02/13/2020 1518   LYMPHSABS 1.8 02/02/2017 1136   MONOABS 0.8 04/18/2019 0631   MONOABS 0.8 02/02/2017 1136   EOSABS 80 02/13/2020 1518   EOSABS 0.3 02/02/2017 1136   BASOSABS 22 02/13/2020 1518   BASOSABS 0.1 02/02/2017 1136    BMET    Component Value Date/Time   NA 132 (L) 03/24/2020 1051   NA 143 05/18/2017 1218   NA 143 02/02/2017 1137   K 4.7 03/24/2020 1051   K 3.8 02/02/2017 1137   CL 93 (L) 03/24/2020 1051   CO2 26 03/24/2020 1051   CO2 24 02/02/2017 1137   GLUCOSE 151 (H) 03/24/2020 1051   GLUCOSE 74 02/02/2017 1137   BUN 38 (H) 03/24/2020 1051   BUN 76 (HH) 05/18/2017 1218   BUN  63.4 (H) 02/02/2017 1137   CREATININE 6.18 (H) 03/24/2020 1051   CREATININE 5.12 (H) 02/13/2020 1518   CREATININE 3.0 (HH) 02/02/2017 1137   CALCIUM 10.1 03/24/2020 1051   CALCIUM 9.9 02/02/2017 1137   GFRNONAA 7 (L) 03/24/2020 1051   GFRNONAA 8 (L) 02/13/2020 1518   GFRAA 9 (L) 02/13/2020 1518    INR    Component Value Date/Time   INR 1.2 03/14/2020 1421     Intake/Output Summary (Last 24 hours) at 03/25/2020 0749 Last data filed at 03/24/2020 1642 Gross per 24 hour  Intake --  Output 1842 ml  Net -1842 ml     Assessment/Plan:  74 y.o. female is s/p right external iliac and common femoral endarterectomy with Dacron patch angioplasty, right femoral to anterior tibial bypass with 6 mm ringed propatent Gore-Tex graft 9 Days Post-Op. Right lower extremity is well perfused and warm. Brisk PT doppler signal right foot. Continue with dry gauze to wick moisture in right groin. OOB and mobilize as tolerated. Awaiting SNF bed availability  DVT prophylaxis:  Sq. heparin   Karoline Caldwell, PA-C [facility] [phone] 03/25/2020 7:49 AM   I have seen and evaluated the patient. I agree  with the PA note as documented above. ` Status post right femoral endarterectomy with a right femoral to AT bypass with 6 mm PTFE.  Her groin incision looks better today.  She does have good Doppler signals in the foot.  Awaiting SNF placement.  Marty Heck, MD [facility] [place] Office: [phone]

## 2020-03-25 NOTE — Progress Notes (Addendum)
[facility] Progress Note   Subjective:   Patient seen and examined at bedside.  Reports dialysis went ok yesterday.  Continues to feel a little foggy.  Denies CP, SOB, n/v/d.  Continues to have constipation, taking miralax again this AM.   Objective Vitals:   03/24/20 2040 03/25/20 0038 03/25/20 0617 03/25/20 0802  BP: (!) 112/52 (!) 96/45 (!) 131/57 (!) 123/50  Pulse: 71 74 74 75  Resp: 16 15 17 10   Temp: 98.4 F (36.9 C) 97.8 F (36.6 C) 97.8 F (36.6 C) 97.9 F (36.6 C)  TempSrc: Oral Oral Oral Oral  SpO2: 100% 98% 95% 95%  Weight:      Height:       Physical Exam General:WDWN female in NAD Heart:RRR Lungs:CTAB, nml WOB on RA Abdomen:soft, NTND Extremities:no LE edema, R foot dressed Dialysis Access: LU AVF +b/t   Filed Weights   03/22/20 1110 03/24/20 1330 03/24/20 1705  Weight: 71.2 kg 71.9 kg (P) 70.1 kg    Intake/Output Summary (Last 24 hours) at 03/25/2020 0949 Last data filed at 03/24/2020 1642 Gross per 24 hour  Intake --  Output 1842 ml  Net -1842 ml    Additional Objective Labs: Basic Metabolic Panel: Recent Labs  Lab 03/18/20 1852 03/22/20 0814 03/24/20 1051  NA 135 135 132*  K 4.0 4.1 4.7  CL 96* 95* 93*  CO2 26 27 26   GLUCOSE 125* 139* 151*  BUN 38* 41* 38*  CREATININE 4.65* 6.24* 6.18*  CALCIUM 8.5* 9.3 10.1  PHOS 5.2* 4.2 4.9*   Liver Function Tests: Recent Labs  Lab 03/18/20 1852 03/22/20 0814 03/24/20 1051  ALBUMIN 2.7* 2.9* 2.8*   CBC: Recent Labs  Lab 03/19/20 1319 03/22/20 0814 03/24/20 1051  WBC 6.7 8.1 6.5  HGB 8.3* 8.3* 8.5*  HCT 24.1* 25.0* 25.4*  MCV 97.2 99.6 100.4*  PLT 166 178 169   Blood Culture    Component Value Date/Time   SDES BLOOD BLOOD LEFT FOREARM 03/23/2019 1538   SPECREQUEST  03/23/2019 1538    BOTTLES DRAWN AEROBIC AND ANAEROBIC Blood Culture results may not be optimal due to an excessive volume of blood received in culture bottles   CULT  03/23/2019 1538    NO GROWTH 5  DAYS Performed at [facility], [address] [place], [place] [address]    REPTSTATUS 03/28/2019 FINAL 03/23/2019 1538   CBG: Recent Labs  Lab 03/24/20 0601 03/24/20 1131 03/24/20 1750 03/24/20 2211 03/25/20 0615  GLUCAP 142* 145* 149* 122* 110*   Medications:  iron sucrose Stopped (03/24/20 1733)   magnesium sulfate bolus IVPB      allopurinol  100 mg Oral BID   atorvastatin  10 mg Oral Daily   Chlorhexidine Gluconate Cloth  6 each Topical Q0600   Chlorhexidine Gluconate Cloth  6 each Topical Q0600   darbepoetin (ARANESP) injection - DIALYSIS  60 mcg Intravenous Q Sat-HD   docusate sodium  100 mg Oral BID   doxercalciferol  3 mcg Intravenous Q T,Th,Sa-HD   heparin  5,000 Units Subcutaneous Q8H   insulin aspart  0-15 Units Subcutaneous TID WC   isosorbide dinitrate  10 mg Oral TID   polyethylene glycol  17 g Oral Daily   sevelamer carbonate  2,400 mg Oral TID WC   traZODone  25 mg Oral QHS    Dialysis Orders: TTS -NW 3h 83min 350/ 800 70kg 2K/2Ca bath Hep 2500 R AVF P2 Mircera70mcg q4wks - last11/4 Hectorol72mcg IV qHD  Problem/Plan: 1. Non healing ulcer R foot/critical limb ischemia- s/pDr. Lonn [place] external iliac &common fem endarterectomy w/darcon patch angioplasty and R fem to anterior tibial bypass12/10. Pain mostly well controlled - per VVS. Try to avoid morphine in HD patients - d/c prn order. 2. ESRD- HD TTS. HD yesterday tolerated well.Heparin resumed. Next HD on 12/21. Run patient full time this week, 3h 15min, given ongoing nausea.  3. HTN/volume-BPstable.no BP lowering meds, getting under dry, will likely need to lower on d/c. 4. Anemiaof CKD- Hgb stable at 8.5 yesterday. droppostsurgery s/p1 unit pRBCon 12/10. ContAranesp 60 mcgqSat. tsat 24%. Given short iron course.  5. Secondary Hyperparathyroidism -CCa 11,phos in goal. Continue binders-3 renvela. Hold VDRA for now. Use low Ca bath.  ` 6. Nutrition- Renal diet w/ fluid restrictions.  7. Hx CAD 8. Systolic/diastolic HF 9. Dispo - ok for dc from renal standpoint. SW working on placement. No beds in [place], PA-C [place] 03/25/2020,9:49 AM  LOS: 9 days   Pt seen, examined and agree w A/P as above.  [name]  MD 03/25/2020, 12:30 PM

## 2020-03-26 LAB — GLUCOSE, CAPILLARY
Glucose-Capillary: 110 mg/dL — ABNORMAL HIGH (ref 70–99)
Glucose-Capillary: 118 mg/dL — ABNORMAL HIGH (ref 70–99)
Glucose-Capillary: 128 mg/dL — ABNORMAL HIGH (ref 70–99)
Glucose-Capillary: 116 mg/dL — ABNORMAL HIGH (ref 70–99)

## 2020-03-26 LAB — SARS CORONAVIRUS 2 BY RT PCR (HOSPITAL ORDER, PERFORMED IN ~~LOC~~ HOSPITAL LAB): SARS Coronavirus 2: NEGATIVE

## 2020-03-26 NOTE — TOC Progression Note (Signed)
Transition of Care [facility]) - Progression Note    Patient Details  Name: [name] MRN: [id number] Date of Birth: [birth year]  Transition of Care [facility]) CM/SW [place], [place] Phone Number: 03/26/2020, 9:54 AM  Clinical Narrative:     Reached out to [facility]- No answer  Sent message to [facility]-waiting on response.  Thurmond Butts, MSW, LCSW Clinical Social Worker   Expected Discharge Plan: Home w Home Health Services Barriers to Discharge: Continued Medical Work up  Expected Discharge Plan and Services Expected Discharge Plan: [place] In-house Referral: Clinical Social Work                                             Social Determinants of Health (SDOH) Interventions    Readmission Risk Interventions Readmission Risk Prevention Plan 03/18/2020  Transportation Screening Complete  HRI or Home Care Consult Complete  SW Recovery Care/Counseling Consult Complete  Palliative Care Screening Not Applicable  Skilled Nursing Facility Complete  Some recent data might be hidden

## 2020-03-26 NOTE — Progress Notes (Addendum)
Progress Note    03/26/2020 7:46 AM 10 Days Post-Op  Subjective:  Frustrated with her discharge and where she will go   Vitals:   03/25/20 2357 03/26/20 0445  BP: (!) 119/59 (!) 120/47  Pulse: (!) 38 71  Resp: 15 19  Temp: 97.9 F (36.6 C) 97.8 F (36.6 C)  SpO2: 99% 100%   Physical Exam: Cardiac: regular Lungs: non labored Incisions: right groin incision is clean and dry. Very small superficial separation of superior aspect of groin incision. No drainage or erythema. Dry gauze reapplied. Brisk PT doppler signal Extremities:  Well perfused and warm  Neurologic: alert and oriented  CBC    Component Value Date/Time   WBC 6.5 03/24/2020 1051   RBC 2.53 (L) 03/24/2020 1051   HGB 8.5 (L) 03/24/2020 1051   HGB 11.7 05/18/2017 1218   HGB 12.3 02/02/2017 1136   HCT 25.4 (L) 03/24/2020 1051   HCT 34.7 05/18/2017 1218   HCT 37.4 02/02/2017 1136   PLT 169 03/24/2020 1051   PLT 227 05/18/2017 1218   MCV 100.4 (H) 03/24/2020 1051   MCV 87 05/18/2017 1218   MCV 90.6 02/02/2017 1136   MCH 33.6 03/24/2020 1051   MCHC 33.5 03/24/2020 1051   RDW 18.6 (H) 03/24/2020 1051   RDW 16.0 (H) 05/18/2017 1218   RDW 15.7 (H) 02/02/2017 1136   LYMPHSABS 1,278 02/13/2020 1518   LYMPHSABS 1.8 02/02/2017 1136   MONOABS 0.8 04/18/2019 0631   MONOABS 0.8 02/02/2017 1136   EOSABS 80 02/13/2020 1518   EOSABS 0.3 02/02/2017 1136   BASOSABS 22 02/13/2020 1518   BASOSABS 0.1 02/02/2017 1136    BMET    Component Value Date/Time   NA 132 (L) 03/24/2020 1051   NA 143 05/18/2017 1218   NA 143 02/02/2017 1137   K 4.7 03/24/2020 1051   K 3.8 02/02/2017 1137   CL 93 (L) 03/24/2020 1051   CO2 26 03/24/2020 1051   CO2 24 02/02/2017 1137   GLUCOSE 151 (H) 03/24/2020 1051   GLUCOSE 74 02/02/2017 1137   BUN 38 (H) 03/24/2020 1051   BUN 76 (HH) 05/18/2017 1218   BUN 63.4 (H) 02/02/2017 1137   CREATININE 6.18 (H) 03/24/2020 1051   CREATININE 5.12 (H) 02/13/2020 1518   CREATININE 3.0 (HH)  02/02/2017 1137   CALCIUM 10.1 03/24/2020 1051   CALCIUM 9.9 02/02/2017 1137   GFRNONAA 7 (L) 03/24/2020 1051   GFRNONAA 8 (L) 02/13/2020 1518   GFRAA 9 (L) 02/13/2020 1518    INR    Component Value Date/Time   INR 1.2 03/14/2020 1421     Intake/Output Summary (Last 24 hours) at 03/26/2020 0746 Last data filed at 03/25/2020 1900 Gross per 24 hour  Intake 350 ml  Output --  Net 350 ml     Assessment/Plan:  74 y.o. female is s/p s/p right external iliac and common femoral endarterectomy with Dacron patch angioplasty, right femoral to anterior tibial bypass with 6 mm ringed propatent Gore-Tex graft 10 Days Post-Op. Right lower extremity with adequate perfusion. Brisk PT signal in right foot. Dry gauze to right groin in area of small superficial separation. Encourage mobilization. Awaiting SNF  DVT prophylaxis:  Sq. Heparin    Karoline Caldwell, PA-C [facility] [phone] 03/26/2020 7:46 AM   I have seen and evaluated the patient. I agree with the PA note as documented above.  Heel wound is stable.  Incisions look fine.  She does have very good Doppler PT  signal.  Awaiting SNF placement.  Keep dry dressing in groin  Marty Heck, MD [facility] [place] Office: [phone]

## 2020-03-26 NOTE — Progress Notes (Signed)
Occupational Therapy Treatment Patient Details Name: [name] MRN: [id number] DOB: November 17, 1945 Today's Date: 03/26/2020    History of present illness 74 yo female with R foot wound has been admitted for R femoral endarterectomy and R fem-pop bypass graft. Orthostatic hypotension post surgery. PMHx:  2020 L fem to below knee pop bypass with saphenous vein, toe amputations, breast CA, L shoulder dislocation, SOB, CKD, CAD, NSTEMI, OA, gout   OT comments  Pt received in bed, agreeable to OT session. She requires minA for functional mobility at RW level. Pt limited to stand-pivot transfer secondary to BP (see below). Pt required minA for LB dressing and setupA for oral care completed in sitting. Pt will continue to benefit from skilled OT services to maximize safety and independence with ADL/IADL and functional mobility. Will continue to follow acutely and progress as tolerated.   Sitting in bed with HOB elevated: BP 131/12mmHg Sitting EOB: 121/32mmHg Transfer to recliner: 120/61mmHg pt with reports of nausea.   Follow Up Recommendations  SNF    Equipment Recommendations  3 in 1 bedside commode    Recommendations for Other Services      Precautions / Restrictions Precautions Precautions: Fall Precaution Comments: watch BP closely Required Braces or Orthoses: Other Brace Other Brace: darco shoe R LE Restrictions Weight Bearing Restrictions: Yes RLE Weight Bearing:  (toe weight bearing only.  MD reports to offset weight on heels.)       Mobility Bed Mobility Overal bed mobility: Needs Assistance Bed Mobility: Sit to Supine     Supine to sit: Min assist     General bed mobility comments: minA to progress trunk to upright posture  Transfers Overall transfer level: Needs assistance Equipment used: Rolling walker (2 wheeled) Transfers: Sit to/from [facility] Sit to Stand: Min assist;From elevated surface Stand pivot transfers: Min assist        General transfer comment: Min A for boosting into standing.  Cues for hand placement to and from seated surface    Balance Overall balance assessment: Needs assistance Sitting-balance support: Feet supported;No upper extremity supported Sitting balance-Leahy Scale: Good     Standing balance support: Bilateral upper extremity supported;During functional activity Standing balance-Leahy Scale: Poor Standing balance comment: relies on UE support for balance                           ADL either performed or assessed with clinical judgement   ADL Overall ADL's : Needs assistance/impaired     Grooming: Set up;Sitting Grooming Details (indicate cue type and reason): completed oral care sitting in recliner             Lower Body Dressing: Sit to/from stand;Minimal assistance Lower Body Dressing Details (indicate cue type and reason): pt needing assitance for donning darco shoe, able to figure-4 to don socks Toilet Transfer: Minimal assistance;RW;Stand-pivot Toilet Transfer Details (indicate cue type and reason): simulated from EOB to recliner         Functional mobility during ADLs: Minimal assistance;Rolling walker General ADL Comments: pt requiring cues for WB status initially, limited by BP     Vision       Perception     Praxis      Cognition Arousal/Alertness: Awake/alert Behavior During Therapy: WFL for tasks assessed/performed Overall Cognitive Status: Impaired/Different from baseline Area of Impairment: Following commands;Problem solving  Following Commands: Follows one step commands with increased time     Problem Solving: Requires verbal cues;Slow processing General Comments: pt with increased time for processing information, appeared to forget her line of thought processing during grooming        Exercises     Shoulder Instructions       General Comments      Pertinent Vitals/ Pain       Pain  Assessment: Faces Faces Pain Scale: Hurts a little bit Pain Location: R heel Pain Descriptors / Indicators: Operative site guarding;Guarding;Grimacing Pain Intervention(s): Monitored during session;Limited activity within patient's tolerance  Home Living                                          Prior Functioning/Environment              Frequency  Min 2X/week        Progress Toward Goals  OT Goals(current goals can now be found in the care plan section)  Progress towards OT goals: Progressing toward goals  Acute Rehab OT Goals Patient Stated Goal: I hope I'm getting better OT Goal Formulation: With patient Time For Goal Achievement: 04/01/20 Potential to Achieve Goals: Fair ADL Goals Pt Will Perform Upper Body Dressing: with modified independence;sitting;with adaptive equipment Pt Will Perform Lower Body Dressing: with modified independence;with adaptive equipment;sit to/from stand Pt Will Transfer to Toilet: with modified independence;ambulating;bedside commode Additional ADL Goal #1: pt will complete bed mobility mod I as precursor to adls.  Plan Discharge plan needs to be updated    Co-evaluation                 AM-PAC OT "6 Clicks" Daily Activity     Outcome Measure   Help from another person eating meals?: None Help from another person taking care of personal grooming?: A Little Help from another person toileting, which includes using toliet, bedpan, or urinal?: A Little Help from another person bathing (including washing, rinsing, drying)?: A Little Help from another person to put on and taking off regular upper body clothing?: A Little Help from another person to put on and taking off regular lower body clothing?: A Little 6 Click Score: 19    End of Session Equipment Utilized During Treatment: Rolling walker;Gait belt  OT Visit Diagnosis: Unsteadiness on feet (R26.81);Muscle weakness (generalized) (M62.81)   Activity Tolerance  Patient tolerated treatment well   Patient Left with call bell/phone within reach;in chair;with chair alarm set   Nurse Communication Mobility status;Precautions        Time: 9211-9417 OT Time Calculation (min): 34 min  Charges: OT General Charges $OT Visit: 1 Visit OT Treatments $Self Care/Home Management : 23-37 mins  Helene Kelp OTR/L Acute Rehabilitation Services Office: [place] 03/26/2020, 12:36 PM

## 2020-03-26 NOTE — Progress Notes (Signed)
  [facility] Progress Note   Assessment/ Plan:   Dialysis Orders: TTS -NW 3h 6min 350/ 800 70kg 2K/2Ca bath Hep 2500 R AVF P2 Mircera41mcg q4wks - last11/4 Hectorol50mcg IV qHD  Problem/Plan: 1. Non healing ulcer R foot/critical limb ischemia- s/pDr. Lonn [place] external iliac &common fem endarterectomy w/darcon patch angioplasty and R fem to anterior tibial bypass12/10. Pain mostly well controlled - per VVS. Try to avoid morphine in HD patients - prn order d/c'd. 2. ESRD- HD TTS. HD yesterday tolerated well.Heparin resumed. Next HD on 12/21. 3. HTN/volume-BPstable.no BP lowering meds, getting under dry, will likely need to lower on d/c. 4. Anemiaof CKD- Hgb stable at 8.5 yesterday.droppostsurgery s/p1 unit pRBCon 12/10. ContAranesp 60 mcgqSat. tsat 24%. Given short iron course.  5. Secondary Hyperparathyroidism -CCa 11,phos in goal. Continue binders-3 renvela. Hold VDRA for now. Use low Ca bath. ` 6. Nutrition- Renal diet w/ fluid restrictions.  7. Hx CAD 8. Systolic/diastolic HF 9. Dispo - ok for dc from renal standpoint. SW working on placement. No beds in CIR   Subjective:    Seen in room.  For HD tomorrow.  No CIR beds available.     Objective:   BP (!) 136/45 (BP Location: Left Arm)   Pulse 76   Temp 97.7 F (36.5 C) (Oral)   Resp 20   Ht 5\' 4"  (1.626 m)   Wt (P) 70.1 kg   SpO2 95%   BMI (P) 26.53 kg/m   Physical Exam: Gen: NAD, sitting in chair HEENT: JVD to angle of mandible CVS: RRR Resp: clear Abd: mildly distended Ext: 1+ LE edema ACCESS: R RC AVF +T/B  Labs: BMET Recent Labs  Lab 03/22/20 0814 03/24/20 1051  NA 135 132*  K 4.1 4.7  CL 95* 93*  CO2 27 26  GLUCOSE 139* 151*  BUN 41* 38*  CREATININE 6.24* 6.18*  CALCIUM 9.3 10.1  PHOS 4.2 4.9*   CBC Recent Labs  Lab 03/19/20 1319 03/22/20 0814 03/24/20 1051  WBC 6.7 8.1 6.5  HGB 8.3* 8.3* 8.5*  HCT 24.1* 25.0* 25.4*   MCV 97.2 99.6 100.4*  PLT 166 178 169      Medications:    . allopurinol  100 mg Oral BID  . atorvastatin  10 mg Oral Daily  . Chlorhexidine Gluconate Cloth  6 each Topical Q0600  . Chlorhexidine Gluconate Cloth  6 each Topical Q0600  . darbepoetin (ARANESP) injection - DIALYSIS  60 mcg Intravenous Q Sat-HD  . docusate sodium  100 mg Oral BID  . heparin  5,000 Units Subcutaneous Q8H  . insulin aspart  0-15 Units Subcutaneous TID WC  . isosorbide dinitrate  10 mg Oral TID  . polyethylene glycol  17 g Oral Daily  . sevelamer carbonate  2,400 mg Oral TID WC  . traZODone  25 mg Oral QHS     Madelon Lips MD 03/26/2020, 12:12 PM

## 2020-03-26 NOTE — TOC Progression Note (Signed)
Transition of Care [facility]) - Progression Note    Patient Details  Name: [name] MRN: [id number] Date of Birth: [birth year]  Transition of Care [facility]) CM/SW [place], [place] Phone Number: 03/26/2020, 12:09 PM  Clinical Narrative:     Enid Baas, left voice message  Helene Kelp -reviewing - barrier is transportation to dialysis TTS.  CSW will continue to  follow and assist with discharge planning.   Thurmond Butts, MSW, LCSW Clinical Social Worker   Expected Discharge Plan: Home w Home Health Services Barriers to Discharge: Continued Medical Work up  Expected Discharge Plan and Services Expected Discharge Plan: [place] In-house Referral: Clinical Social Work                                             Social Determinants of Health (SDOH) Interventions    Readmission Risk Interventions Readmission Risk Prevention Plan 03/18/2020  Transportation Screening Complete  HRI or Home Care Consult Complete  SW Recovery Care/Counseling Consult Complete  Palliative Care Screening Not Applicable  Skilled Nursing Facility Complete  Some recent data might be hidden

## 2020-03-26 NOTE — Progress Notes (Signed)
Inpatient Rehabilitation Admissions Coordinator                               I do not have a CIR bed for this patient for admission.  Danne Baxter, RN, MSN Rehab Admissions Coordinator [phone] 03/26/2020 10:21 AM

## 2020-03-26 NOTE — TOC Progression Note (Signed)
Transition of Care [facility]) - Progression Note    Patient Details  Name: [name] MRN: [id number] Date of Birth: Aug 30, 1945  Transition of Care [facility]) CM/SW [place], [place] Phone Number: 03/26/2020, 3:25 PM  Clinical Narrative:     CSW informed patient's spouse no response from [facility]. He has accepted bed offer w/[facility].   RN updated and covid test requested.   Thurmond Butts, MSW, LCSW Clinical Social Worker   Expected Discharge Plan: Home w Home Health Services Barriers to Discharge: Continued Medical Work up  Expected Discharge Plan and Services Expected Discharge Plan: [place] In-house Referral: Clinical Social Work                                             Social Determinants of Health (SDOH) Interventions    Readmission Risk Interventions Readmission Risk Prevention Plan 03/18/2020  Transportation Screening Complete  HRI or Home Care Consult Complete  SW Recovery Care/Counseling Consult Complete  Palliative Care Screening Not Applicable  Skilled Nursing Facility Complete  Some recent data might be hidden

## 2020-03-26 NOTE — Progress Notes (Signed)
Mobility Specialist: Progress Note   03/26/20 1242  Mobility  Activity Ambulated in room  Level of Assistance Minimal assist, patient does 75% or more  Assistive Device Front wheel walker  Distance Ambulated (ft) 16 ft  Mobility Response Tolerated well  Mobility performed by Mobility specialist  Bed Position Chair  $Mobility charge 1 Mobility   Pre-Mobility on 2 L/min [place]: 79 HR, 118/41 BP, 100% SpO2 Post-Mobility on RA: 75 HR, 134/53 BP, 95% SpO2  Pt ambulated on RA. Pt's distance limited due to fatigue. Pt back to chair after ambulation to eat lunch, RN present.   [facility] Amarii Bordas Mobility Specialist

## 2020-03-26 NOTE — Care Management Important Message (Signed)
Important Message  Patient Details  Name: KADEISHA BETSCH MRN: [id number] Date of Birth: December 11, 1945   Medicare Important Message Given:  Yes     [name] 03/26/2020, 11:28 AM

## 2020-03-26 NOTE — Progress Notes (Signed)
Mobility Specialist: Progress Note   03/26/20 1513  Mobility  Activity Transferred:  Chair to bed  Level of Assistance Minimal assist, patient does 75% or more  Assistive Device Front wheel walker  Mobility Response Tolerated well  Mobility performed by Mobility specialist  $Mobility charge 1 Mobility   Pt asx during transfer.   [facility] Kallyn Demarcus Mobility Specialist

## 2020-03-27 DIAGNOSIS — Z992 Dependence on renal dialysis: Secondary | ICD-10-CM | POA: Diagnosis not present

## 2020-03-27 DIAGNOSIS — E1122 Type 2 diabetes mellitus with diabetic chronic kidney disease: Secondary | ICD-10-CM | POA: Diagnosis present

## 2020-03-27 DIAGNOSIS — Y832 Surgical operation with anastomosis, bypass or graft as the cause of abnormal reaction of the patient, or of later complication, without mention of misadventure at the time of the procedure: Secondary | ICD-10-CM | POA: Diagnosis present

## 2020-03-27 DIAGNOSIS — R531 Weakness: Secondary | ICD-10-CM | POA: Diagnosis not present

## 2020-03-27 DIAGNOSIS — R5381 Other malaise: Secondary | ICD-10-CM | POA: Diagnosis not present

## 2020-03-27 DIAGNOSIS — I10 Essential (primary) hypertension: Secondary | ICD-10-CM | POA: Diagnosis not present

## 2020-03-27 DIAGNOSIS — E785 Hyperlipidemia, unspecified: Secondary | ICD-10-CM | POA: Diagnosis present

## 2020-03-27 DIAGNOSIS — L97419 Non-pressure chronic ulcer of right heel and midfoot with unspecified severity: Secondary | ICD-10-CM | POA: Diagnosis present

## 2020-03-27 DIAGNOSIS — E11319 Type 2 diabetes mellitus with unspecified diabetic retinopathy without macular edema: Secondary | ICD-10-CM | POA: Diagnosis not present

## 2020-03-27 DIAGNOSIS — E0822 Diabetes mellitus due to underlying condition with diabetic chronic kidney disease: Secondary | ICD-10-CM | POA: Diagnosis not present

## 2020-03-27 DIAGNOSIS — M6281 Muscle weakness (generalized): Secondary | ICD-10-CM | POA: Diagnosis not present

## 2020-03-27 DIAGNOSIS — I872 Venous insufficiency (chronic) (peripheral): Secondary | ICD-10-CM | POA: Diagnosis not present

## 2020-03-27 DIAGNOSIS — I499 Cardiac arrhythmia, unspecified: Secondary | ICD-10-CM | POA: Diagnosis not present

## 2020-03-27 DIAGNOSIS — T8149XA Infection following a procedure, other surgical site, initial encounter: Secondary | ICD-10-CM | POA: Diagnosis not present

## 2020-03-27 DIAGNOSIS — L97412 Non-pressure chronic ulcer of right heel and midfoot with fat layer exposed: Secondary | ICD-10-CM | POA: Diagnosis not present

## 2020-03-27 DIAGNOSIS — Z89421 Acquired absence of other right toe(s): Secondary | ICD-10-CM | POA: Diagnosis not present

## 2020-03-27 DIAGNOSIS — Z794 Long term (current) use of insulin: Secondary | ICD-10-CM | POA: Diagnosis not present

## 2020-03-27 DIAGNOSIS — G9341 Metabolic encephalopathy: Secondary | ICD-10-CM | POA: Diagnosis not present

## 2020-03-27 DIAGNOSIS — I739 Peripheral vascular disease, unspecified: Secondary | ICD-10-CM | POA: Diagnosis not present

## 2020-03-27 DIAGNOSIS — E8889 Other specified metabolic disorders: Secondary | ICD-10-CM | POA: Diagnosis present

## 2020-03-27 DIAGNOSIS — I272 Pulmonary hypertension, unspecified: Secondary | ICD-10-CM | POA: Diagnosis not present

## 2020-03-27 DIAGNOSIS — Z8051 Family history of malignant neoplasm of kidney: Secondary | ICD-10-CM | POA: Diagnosis not present

## 2020-03-27 DIAGNOSIS — Z9889 Other specified postprocedural states: Secondary | ICD-10-CM | POA: Diagnosis not present

## 2020-03-27 DIAGNOSIS — R404 Transient alteration of awareness: Secondary | ICD-10-CM | POA: Diagnosis not present

## 2020-03-27 DIAGNOSIS — M109 Gout, unspecified: Secondary | ICD-10-CM | POA: Diagnosis not present

## 2020-03-27 DIAGNOSIS — J90 Pleural effusion, not elsewhere classified: Secondary | ICD-10-CM | POA: Diagnosis not present

## 2020-03-27 DIAGNOSIS — E1129 Type 2 diabetes mellitus with other diabetic kidney complication: Secondary | ICD-10-CM | POA: Diagnosis not present

## 2020-03-27 DIAGNOSIS — I9789 Other postprocedural complications and disorders of the circulatory system, not elsewhere classified: Secondary | ICD-10-CM | POA: Diagnosis not present

## 2020-03-27 DIAGNOSIS — N186 End stage renal disease: Secondary | ICD-10-CM | POA: Diagnosis present

## 2020-03-27 DIAGNOSIS — E11621 Type 2 diabetes mellitus with foot ulcer: Secondary | ICD-10-CM | POA: Diagnosis not present

## 2020-03-27 DIAGNOSIS — I70229 Atherosclerosis of native arteries of extremities with rest pain, unspecified extremity: Secondary | ICD-10-CM | POA: Diagnosis not present

## 2020-03-27 DIAGNOSIS — E43 Unspecified severe protein-calorie malnutrition: Secondary | ICD-10-CM | POA: Diagnosis present

## 2020-03-27 DIAGNOSIS — T827XXA Infection and inflammatory reaction due to other cardiac and vascular devices, implants and grafts, initial encounter: Secondary | ICD-10-CM | POA: Diagnosis not present

## 2020-03-27 DIAGNOSIS — J9811 Atelectasis: Secondary | ICD-10-CM | POA: Diagnosis not present

## 2020-03-27 DIAGNOSIS — I5040 Unspecified combined systolic (congestive) and diastolic (congestive) heart failure: Secondary | ICD-10-CM | POA: Diagnosis not present

## 2020-03-27 DIAGNOSIS — R41841 Cognitive communication deficit: Secondary | ICD-10-CM | POA: Diagnosis not present

## 2020-03-27 DIAGNOSIS — Z7401 Bed confinement status: Secondary | ICD-10-CM | POA: Diagnosis not present

## 2020-03-27 DIAGNOSIS — E119 Type 2 diabetes mellitus without complications: Secondary | ICD-10-CM | POA: Diagnosis not present

## 2020-03-27 DIAGNOSIS — J811 Chronic pulmonary edema: Secondary | ICD-10-CM | POA: Diagnosis not present

## 2020-03-27 DIAGNOSIS — R2681 Unsteadiness on feet: Secondary | ICD-10-CM | POA: Diagnosis not present

## 2020-03-27 DIAGNOSIS — J069 Acute upper respiratory infection, unspecified: Secondary | ICD-10-CM | POA: Diagnosis not present

## 2020-03-27 DIAGNOSIS — R69 Illness, unspecified: Secondary | ICD-10-CM | POA: Diagnosis not present

## 2020-03-27 DIAGNOSIS — I639 Cerebral infarction, unspecified: Secondary | ICD-10-CM | POA: Diagnosis not present

## 2020-03-27 DIAGNOSIS — I361 Nonrheumatic tricuspid (valve) insufficiency: Secondary | ICD-10-CM | POA: Diagnosis not present

## 2020-03-27 DIAGNOSIS — R040 Epistaxis: Secondary | ICD-10-CM | POA: Diagnosis present

## 2020-03-27 DIAGNOSIS — M2550 Pain in unspecified joint: Secondary | ICD-10-CM | POA: Diagnosis not present

## 2020-03-27 DIAGNOSIS — N2581 Secondary hyperparathyroidism of renal origin: Secondary | ICD-10-CM | POA: Diagnosis present

## 2020-03-27 DIAGNOSIS — Z9641 Presence of insulin pump (external) (internal): Secondary | ICD-10-CM | POA: Diagnosis not present

## 2020-03-27 DIAGNOSIS — L89311 Pressure ulcer of right buttock, stage 1: Secondary | ICD-10-CM | POA: Diagnosis present

## 2020-03-27 DIAGNOSIS — E559 Vitamin D deficiency, unspecified: Secondary | ICD-10-CM | POA: Diagnosis not present

## 2020-03-27 DIAGNOSIS — E1142 Type 2 diabetes mellitus with diabetic polyneuropathy: Secondary | ICD-10-CM | POA: Diagnosis not present

## 2020-03-27 DIAGNOSIS — I255 Ischemic cardiomyopathy: Secondary | ICD-10-CM | POA: Diagnosis present

## 2020-03-27 DIAGNOSIS — I890 Lymphedema, not elsewhere classified: Secondary | ICD-10-CM | POA: Diagnosis not present

## 2020-03-27 DIAGNOSIS — I132 Hypertensive heart and chronic kidney disease with heart failure and with stage 5 chronic kidney disease, or end stage renal disease: Secondary | ICD-10-CM | POA: Diagnosis present

## 2020-03-27 DIAGNOSIS — Z515 Encounter for palliative care: Secondary | ICD-10-CM | POA: Diagnosis not present

## 2020-03-27 DIAGNOSIS — Z66 Do not resuscitate: Secondary | ICD-10-CM | POA: Diagnosis not present

## 2020-03-27 DIAGNOSIS — I25810 Atherosclerosis of coronary artery bypass graft(s) without angina pectoris: Secondary | ICD-10-CM | POA: Diagnosis not present

## 2020-03-27 DIAGNOSIS — I120 Hypertensive chronic kidney disease with stage 5 chronic kidney disease or end stage renal disease: Secondary | ICD-10-CM | POA: Diagnosis not present

## 2020-03-27 DIAGNOSIS — L97512 Non-pressure chronic ulcer of 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d: Secondary | ICD-10-CM | POA: Diagnosis not present

## 2020-03-27 DIAGNOSIS — T82868A Thrombosis of vascular prosthetic devices, implants and grafts, initial encounter: Secondary | ICD-10-CM | POA: Diagnosis not present

## 2020-03-27 DIAGNOSIS — Z48812 Encounter for surgical aftercare following surgery on the circulatory system: Secondary | ICD-10-CM | POA: Diagnosis not present

## 2020-03-27 DIAGNOSIS — Z20822 Contact with and (suspected) exposure to covid-19: Secondary | ICD-10-CM | POA: Diagnosis present

## 2020-03-27 DIAGNOSIS — Z20818 Contact with and (suspected) exposure to other bacterial communicable diseases: Secondary | ICD-10-CM | POA: Diagnosis not present

## 2020-03-27 DIAGNOSIS — I340 Nonrheumatic mitral (valve) insufficiency: Secondary | ICD-10-CM | POA: Diagnosis not present

## 2020-03-27 DIAGNOSIS — D631 Anemia in chronic kidney disease: Secondary | ICD-10-CM | POA: Diagnosis present

## 2020-03-27 DIAGNOSIS — E11649 Type 2 diabetes mellitus with hypoglycemia without coma: Secondary | ICD-10-CM | POA: Diagnosis not present

## 2020-03-27 DIAGNOSIS — E441 Mild protein-calorie malnutrition: Secondary | ICD-10-CM | POA: Diagnosis not present

## 2020-03-27 DIAGNOSIS — I2510 Atherosclerotic heart disease of native coronary artery without angina pectoris: Secondary | ICD-10-CM | POA: Diagnosis present

## 2020-03-27 DIAGNOSIS — L97516 Non-pressure chronic ulcer of other part of right foot with bone involvement without evidence of necrosis: Secondary | ICD-10-CM | POA: Diagnosis not present

## 2020-03-27 DIAGNOSIS — L8990 Pressure ulcer of unspecified site, unspecified stage: Secondary | ICD-10-CM | POA: Diagnosis not present

## 2020-03-27 DIAGNOSIS — D509 Iron deficiency anemia, unspecified: Secondary | ICD-10-CM | POA: Diagnosis not present

## 2020-03-27 DIAGNOSIS — I517 Cardiomegaly: Secondary | ICD-10-CM | POA: Diagnosis not present

## 2020-03-27 DIAGNOSIS — Z808 Family history of malignant neoplasm of other organs or systems: Secondary | ICD-10-CM | POA: Diagnosis not present

## 2020-03-27 DIAGNOSIS — I5042 Chronic combined systolic (congestive) and diastolic (congestive) heart failure: Secondary | ICD-10-CM | POA: Diagnosis present

## 2020-03-27 DIAGNOSIS — Z7189 Other specified counseling: Secondary | ICD-10-CM | POA: Diagnosis not present

## 2020-03-27 DIAGNOSIS — L97812 Non-pressure chronic ulcer of other part of right lower leg with fat layer exposed: Secondary | ICD-10-CM | POA: Diagnosis not present

## 2020-03-27 DIAGNOSIS — E1151 Type 2 diabetes mellitus with diabetic peripheral angiopathy without gangrene: Secondary | ICD-10-CM | POA: Diagnosis present

## 2020-03-27 DIAGNOSIS — L97518 Non-pressure chronic ulcer of other part of right foot with other specified severity: Secondary | ICD-10-CM | POA: Diagnosis not present

## 2020-03-27 LAB — RENAL FUNCTION PANEL
Albumin: 3.2 g/dL — ABNORMAL LOW (ref 3.5–5.0)
Anion gap: 17 — ABNORMAL HIGH (ref 5–15)
BUN: 43 mg/dL — ABNORMAL HIGH (ref 8–23)
CO2: 22 mmol/L (ref 22–32)
Calcium: 10.7 mg/dL — ABNORMAL HIGH (ref 8.9–10.3)
Chloride: 91 mmol/L — ABNORMAL LOW (ref 98–111)
Creatinine, Ser: 7.18 mg/dL — ABNORMAL HIGH (ref 0.44–1.00)
GFR, Estimated: 6 mL/min — ABNORMAL LOW (ref >60)
Glucose, Bld: 97 mg/dL (ref 70–99)
Phosphorus: 6.6 mg/dL — ABNORMAL HIGH (ref 2.5–4.6)
Potassium: 5.1 mmol/L (ref 3.5–5.1)
Sodium: 130 mmol/L — ABNORMAL LOW (ref 135–145)

## 2020-03-27 LAB — CBC
HCT: 30.9 % — ABNORMAL LOW (ref 36.0–46.0)
Hemoglobin: 10 g/dL — ABNORMAL LOW (ref 12.0–15.0)
MCH: 33.2 pg (ref 26.0–34.0)
MCHC: 32.4 g/dL (ref 30.0–36.0)
MCV: 102.7 fL — ABNORMAL HIGH (ref 80.0–100.0)
Platelets: 147 10*3/uL — ABNORMAL LOW (ref 150–400)
RBC: 3.01 MIL/uL — ABNORMAL LOW (ref 3.87–5.11)
RDW: 19.5 % — ABNORMAL HIGH (ref 11.5–15.5)
WBC: 6.9 10*3/uL (ref 4.0–10.5)
nRBC: 1 % — ABNORMAL HIGH (ref 0.0–0.2)

## 2020-03-27 LAB — GLUCOSE, CAPILLARY
Glucose-Capillary: 101 mg/dL — ABNORMAL HIGH (ref 70–99)
Glucose-Capillary: 91 mg/dL (ref 70–99)
Glucose-Capillary: 92 mg/dL (ref 70–99)

## 2020-03-27 MED ORDER — OXYCODONE-ACETAMINOPHEN 5-325 MG PO TABS
ORAL_TABLET | ORAL | Status: AC
  Administered 2020-03-27: 2 via ORAL
  Filled 2020-03-27: qty 2

## 2020-03-27 MED ORDER — HYDROCODONE-ACETAMINOPHEN 5-325 MG PO TABS: 1.0000 | ORAL_TABLET | Freq: Four times a day (QID) | ORAL | 0 refills | Status: DC | PRN

## 2020-03-27 NOTE — Progress Notes (Addendum)
[facility] Progress Note   Subjective: Seen on HD, very pleasant. No C/Os. Awaiting SNF placement.    Objective Vitals:   03/27/20 0800 03/27/20 0830 03/27/20 0900 03/27/20 0930  BP: (!) 158/52 (!) 153/49 (!) 132/41 (!) 132/39  Pulse:      Resp: (!) 22 12 12 12   Temp:      TempSrc:      SpO2:      Weight:      Height:       Physical Exam General: Pleasant elderly female in NAD Heart: S1,S2 RRR. No M/R/G Lungs: CTAB slightly decreased in bases Abdomen: S, NT Extremities: Trace BLE edema. Dialysis Access: R AVF cannulated at present   Additional Objective Labs: Basic Metabolic Panel: Recent Labs  Lab 03/22/20 0814 03/24/20 1051  NA 135 132*  K 4.1 4.7  CL 95* 93*  CO2 27 26  GLUCOSE 139* 151*  BUN 41* 38*  CREATININE 6.24* 6.18*  CALCIUM 9.3 10.1  PHOS 4.2 4.9*   Liver Function Tests: Recent Labs  Lab 03/22/20 0814 03/24/20 1051  ALBUMIN 2.9* 2.8*   No results for input(s): LIPASE, AMYLASE in the last 168 hours. CBC: Recent Labs  Lab 03/22/20 0814 03/24/20 1051  WBC 8.1 6.5  HGB 8.3* 8.5*  HCT 25.0* 25.4*  MCV 99.6 100.4*  PLT 178 169   Blood Culture    Component Value Date/Time   SDES BLOOD BLOOD LEFT FOREARM 03/23/2019 1538   SPECREQUEST  03/23/2019 1538    BOTTLES DRAWN AEROBIC AND ANAEROBIC Blood Culture results may not be optimal due to an excessive volume of blood received in culture bottles   CULT  03/23/2019 1538    NO GROWTH 5 DAYS Performed at [facility], [place] [address] [place], [place] [address]    REPTSTATUS 03/28/2019 FINAL 03/23/2019 1538    Cardiac Enzymes: No results for input(s): CKTOTAL, CKMB, CKMBINDEX, TROPONINI in the last 168 hours. CBG: Recent Labs  Lab 03/26/20 1235 03/26/20 1648 03/26/20 2141 03/27/20 0448 03/27/20 0630  GLUCAP 118* 128* 116* 101* 91   Iron Studies: No results for input(s): IRON, TIBC, TRANSFERRIN, FERRITIN in the last 72 hours. @lablastinr3 @ Studies/Results: No  results found. Medications: . iron sucrose Stopped (03/24/20 1733)  . magnesium sulfate bolus IVPB     . allopurinol  100 mg Oral BID  . atorvastatin  10 mg Oral Daily  . Chlorhexidine Gluconate Cloth  6 each Topical Q0600  . Chlorhexidine Gluconate Cloth  6 each Topical Q0600  . darbepoetin (ARANESP) injection - DIALYSIS  60 mcg Intravenous Q Sat-HD  . docusate sodium  100 mg Oral BID  . heparin  5,000 Units Subcutaneous Q8H  . insulin aspart  0-15 Units Subcutaneous TID WC  . isosorbide dinitrate  10 mg Oral TID  . oxyCODONE-acetaminophen      . polyethylene glycol  17 g Oral Daily  . sevelamer carbonate  2,400 mg Oral TID WC  . traZODone  25 mg Oral QHS     Dialysis Orders: TTS -NW 3h 25min 350/ 800 70kg 2K/2Ca bath Hep 2500 R AVF P2 Mircera37mcg q4wks - last11/4 Hectorol26mcg IV qHD  Problem/Plan: 1. Non healing ulcer R foot/critical limb ischemia- s/pDr. Lonn [place] external iliac &common fem endarterectomy w/darcon patch angioplasty and R fem to anterior tibial bypass12/10. Pain mostly well controlled - per VVS. Try to avoid morphine in HD patients - prn order d/c'd. 2. ESRD- HD TTS. HD today on schedule. Tolerating well. Labs pending.  3. HTN/volume-BPstable.no BP lowering meds,gettingunder dry, will likelyneed to lower on d/c. 4. Anemiaof CKD- Hgbstable at8.512/18/2021. .droppostsurgery s/p1 unit pRBCon 12/10. ContAranesp 60 mcgqSat. tsat 24%. Givenshort iron course.  5. Secondary Hyperparathyroidism -CCa11,phos in goal. Continue binders-3 renvela. Hold VDRA for now. Use low Ca bath. ` 6. Nutrition- Renal diet w/ fluid restrictions.  7. Hx CAD 8. Systolic/diastolic HF 9. Dispo - ok for dc from renal standpoint. SW working on placement. No beds in [place]. Kelii Chittum NP-C 03/27/2020, 10:05 AM  [employer] [phone]

## 2020-03-27 NOTE — TOC Transition Note (Signed)
Transition of Care [facility]) - CM/SW Discharge Note   Patient Details  Name: [name] MRN: [id number] Date of Birth: 1945/07/16  Transition of Care [facility]) CM/SW Contact:  Trula Ore, [place] Phone Number: 03/27/2020, 1:45 PM   Clinical Narrative:     Patient will DC to: [facility]   Anticipated DC date: 03/27/2020  Family notified: [name]  Transport by: Corey Harold  ?  Per MD patient ready for DC to [facility] . RN, patient, patient's family,renal navigator, and facility notified of DC.  Discharge Summary sent to facility. RN given number for report tele# [phone] RM#[id number]. DC packet on chart. Ambulance transport requested for patient.  CSW signing off.    Final next level of care: Skilled Nursing Facility Barriers to Discharge: No Barriers Identified   Patient Goals and CMS Choice   CMS Medicare.gov Compare Post Acute Care list provided to:: Patient Represenative (must comment) ([name] spouse) Choice offered to / list presented to : Spouse [name])  Discharge Placement              Patient chooses bed at: [facility] Patient to be transferred to facility by: [place] Name of family member notified: [name] Patient and family notified of of transfer: 03/27/20  Discharge Plan and Services In-house Referral: Clinical Social Work                                   Social Determinants of Health ([place]) Interventions     Readmission Risk Interventions Readmission Risk Prevention Plan 03/18/2020  Transportation Screening Complete  HRI or Home Care Consult Complete  SW Recovery Care/Counseling Consult Complete  Palliative Care Screening Not Applicable  Skilled Nursing Facility Complete  Some recent data might be hidden

## 2020-03-27 NOTE — Progress Notes (Signed)
PT Cancellation Note  Patient Details Name: [name] MRN: [id number] DOB: 19-Jun-1945   Cancelled Treatment:    Reason Eval/Treat Not Completed: (P) Patient at procedure or test/unavailable (Off unit for HD will f/u per POC.)   Chattie Greeson J Stann Mainland 03/27/2020, 11:12 AM  Erasmo Leventhal , PTA Acute Rehabilitation Services Pager [phone] Office [phone]

## 2020-03-27 NOTE — Consult Note (Signed)
   [facility] CM Inpatient Consult   03/27/2020  [name] [facility] [birth year] [id number]  Follow up:   Patient is for a skilled nursing facility,   Plan: will alert [facility] RN for follow up for transitional needs.  [name], RN BSN [facility] Liaison  [phone] business mobile phone Toll free office [phone]  Fax number: [phone] [email or web address]

## 2020-03-27 NOTE — Progress Notes (Signed)
Mobility Specialist: Progress Note   03/27/20 1451  Mobility  Activity Ambulated in room  Level of Assistance Minimal assist, patient does 75% or more  Assistive Device Front wheel walker  Distance Ambulated (ft) 14 ft  Mobility Response Tolerated well  Mobility performed by Mobility specialist  $Mobility charge 1 Mobility   Pre-Mobility: 68 HR, 104/72 BP, 99% SpO2 During Mobility: 79 HR Post-Mobility: 71 HR, 125/36 BP, 97% SpO2  Pt ambulated from one side of the bed to the other. Pt had no c/o during ambulation. RN notified about pt's BP seen above. Pt feeling lethargic and struggling to keep her eyes open post ambulation while supine in bed, RN present.   [facility] Jozalyn Baglio Mobility Specialist

## 2020-03-27 NOTE — Progress Notes (Addendum)
  Progress Note    03/27/2020 7:49 AM 11 Days Post-Op  Subjective:  No new complaints   Vitals:   03/27/20 0300 03/27/20 0449  BP:  (!) 148/53  Pulse: 60 72  Resp:  15  Temp:  (!) 97.5 F (36.4 C)  SpO2:  97%   Physical Exam: Cardiac: RRR   Lungs:  Non labored Incisions:  R groin and popliteal incisions c/d/i Extremities:  R PT and DP signal by doppler Abdomen:  soft Neurologic: A&O  CBC    Component Value Date/Time   WBC 6.5 03/24/2020 1051   RBC 2.53 (L) 03/24/2020 1051   HGB 8.5 (L) 03/24/2020 1051   HGB 11.7 05/18/2017 1218   HGB 12.3 02/02/2017 1136   HCT 25.4 (L) 03/24/2020 1051   HCT 34.7 05/18/2017 1218   HCT 37.4 02/02/2017 1136   PLT 169 03/24/2020 1051   PLT 227 05/18/2017 1218   MCV 100.4 (H) 03/24/2020 1051   MCV 87 05/18/2017 1218   MCV 90.6 02/02/2017 1136   MCH 33.6 03/24/2020 1051   MCHC 33.5 03/24/2020 1051   RDW 18.6 (H) 03/24/2020 1051   RDW 16.0 (H) 05/18/2017 1218   RDW 15.7 (H) 02/02/2017 1136   LYMPHSABS 1,278 02/13/2020 1518   LYMPHSABS 1.8 02/02/2017 1136   MONOABS 0.8 04/18/2019 0631   MONOABS 0.8 02/02/2017 1136   EOSABS 80 02/13/2020 1518   EOSABS 0.3 02/02/2017 1136   BASOSABS 22 02/13/2020 1518   BASOSABS 0.1 02/02/2017 1136    BMET    Component Value Date/Time   NA 132 (L) 03/24/2020 1051   NA 143 05/18/2017 1218   NA 143 02/02/2017 1137   K 4.7 03/24/2020 1051   K 3.8 02/02/2017 1137   CL 93 (L) 03/24/2020 1051   CO2 26 03/24/2020 1051   CO2 24 02/02/2017 1137   GLUCOSE 151 (H) 03/24/2020 1051   GLUCOSE 74 02/02/2017 1137   BUN 38 (H) 03/24/2020 1051   BUN 76 (HH) 05/18/2017 1218   BUN 63.4 (H) 02/02/2017 1137   CREATININE 6.18 (H) 03/24/2020 1051   CREATININE 5.12 (H) 02/13/2020 1518   CREATININE 3.0 (HH) 02/02/2017 1137   CALCIUM 10.1 03/24/2020 1051   CALCIUM 9.9 02/02/2017 1137   GFRNONAA 7 (L) 03/24/2020 1051   GFRNONAA 8 (L) 02/13/2020 1518   GFRAA 9 (L) 02/13/2020 1518    INR    Component  Value Date/Time   INR 1.2 03/14/2020 1421     Intake/Output Summary (Last 24 hours) at 03/27/2020 0749 Last data filed at 03/26/2020 1300 Gross per 24 hour  Intake 360 ml  Output --  Net 360 ml     Assessment/Plan:  74 y.o. female is s/p R CFA endarterectomy and femoral to ATA bypass with PTFE 11 Days Post-Op   R foot well perfused Encouraged offloading R heal while in bed HD this morning per [facility] for SNF when bed approved    Dagoberto Ligas, PA-C [facility] [phone] 03/27/2020 7:49 AM  I have examined the patient, reviewed and agree with above.  Foot is well-perfused.  Heel and lateral right foot eschar healing.  Again discussed concern regarding the depth of this and that it will take some time to completely demarcate this and heal.  Stable for discharge to skilled nursing facility.  Will see in the office in 2 to 3 weeks  Curt Jews, MD 03/27/2020 1:29 PM

## 2020-03-27 NOTE — Progress Notes (Signed)
Report given to [facility] RN @SNF . Discharged paperwork placed on cart. Will continue to monitor    Phoebe Sharps, RN

## 2020-03-28 ENCOUNTER — Telehealth: Payer: Self-pay | Admitting: Nephrology

## 2020-03-28 ENCOUNTER — Encounter: Payer: Self-pay | Admitting: Adult Health

## 2020-03-28 ENCOUNTER — Non-Acute Institutional Stay (SKILLED_NURSING_FACILITY): Payer: Medicare Other | Admitting: Adult Health

## 2020-03-28 DIAGNOSIS — Z992 Dependence on renal dialysis: Secondary | ICD-10-CM | POA: Diagnosis not present

## 2020-03-28 DIAGNOSIS — I10 Essential (primary) hypertension: Secondary | ICD-10-CM | POA: Diagnosis not present

## 2020-03-28 DIAGNOSIS — Z794 Long term (current) use of insulin: Secondary | ICD-10-CM

## 2020-03-28 DIAGNOSIS — I25810 Atherosclerosis of coronary artery bypass graft(s) without angina pectoris: Secondary | ICD-10-CM

## 2020-03-28 DIAGNOSIS — N186 End stage renal disease: Secondary | ICD-10-CM | POA: Diagnosis not present

## 2020-03-28 DIAGNOSIS — I70229 Atherosclerosis of native arteries of extremities with rest pain, unspecified extremity: Secondary | ICD-10-CM

## 2020-03-28 DIAGNOSIS — E119 Type 2 diabetes mellitus without complications: Secondary | ICD-10-CM | POA: Diagnosis not present

## 2020-03-28 DIAGNOSIS — M109 Gout, unspecified: Secondary | ICD-10-CM | POA: Diagnosis not present

## 2020-03-28 NOTE — Progress Notes (Signed)
Location:  [facility] Room Number: 124-A Place of Service:  SNF (31) Provider:  Durenda Age, DNP, FNP-BC  Patient Care Team: Unk Pinto, MD as PCP - General (Internal Medicine) Josue Hector, MD as PCP - Cardiology (Cardiology) Magrinat, Virgie Dad, MD as Consulting Physician (Oncology) Kyung Rudd, MD as Consulting Physician (Radiation Oncology) Newt Minion, MD as Consulting Physician (Orthopedic Surgery) Philemon Kingdom, MD as Consulting Physician (Internal Medicine) Josue Hector, MD as Consulting Physician (Cardiology) Inda Castle, MD (Inactive) as Consulting Physician (Gastroenterology) Rolm Bookbinder, MD as Consulting Physician (General Surgery) Calvert Cantor, MD as Consulting Physician (Ophthalmology) Madelon Lips, MD as Consulting Physician (Nephrology)  Extended Emergency Contact Information Primary Emergency Contact: [facility] Address: [address]          [place], [place] [address] Johnnette Litter of [place] Phone: [phone] Mobile Phone: [phone] Relation: Spouse Secondary Emergency Contact: Pritchett of [place] Phone: [phone] Mobile Phone: [phone] Relation: Son  Code Status:  FULL CODE  Goals of care: Advanced Directive information Advanced Directives 03/16/2020  Does Patient Have a Medical Advance Directive? No  Type of Advance Directive -  Does patient want to make changes to medical advance directive? No - Patient declined  Copy of [place] in Chart? -  Would patient like information on creating a medical advance directive? No - Patient declined  Pre-existing out of facility DNR order (yellow form or pink MOST form) -     Chief Complaint  Patient presents with  . Acute Visit    Patient is seen for hospital followup, status post admission at [facility] 12/10-12/21/21 for critical lower limb ischemia    HPI:  Pt is a 74 y.o. female seen  today for hospital follow-up.  She was admitted to [facility] on 03/27/2020 for short-term rehabilitation following an admission at [facility] 03/16/2020 to 03/27/2020 for critical lower limb ischemia.  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 She is a patient known to [facility] and approximately a year ago, she had left foot toes gangrene.  She underwent arteriography on 03/25/2019 which revealed bilateral superficial femoral artery occlusions.  She underwent left femoral to below-knee popliteal bypass with saphenous vein.  She also had toe amputation with Dr. Sharol Given and eventually had healing of all wounds.  She then developed ulceration over the lateral aspect of her right foot.  She had right external iliac and common femoral endarterectomy with dacron patch angioplasty and right femoral to anterior tibial bypass with 6 mm ringed propatent Gore-Tex graft.  She was seen in her room with her husband at bedside.  Noted resident with mild confusion but able to answer queries.  Past Medical History:  Diagnosis Date  . Allergy   . Anterior dislocation of left shoulder 05/27/2019  . Arthritis    "maybe in my lower back" (08/27/2012)  . 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 Chronic combined systolic and diastolic CHF (congestive heart failure) ([place])   . CKD (chronic kidney disease), stage IV ([place])   . Coronary artery disease    a. NSTEMI in 05/2009 s/p CABG (LIMA-LAD, SVG-diagonal, SVG-OM1/OM 2).   Marland Kitchen Dyspnea    with exertion  . Family history of anesthesia complication    Mom has  a hard time to wake up  . Foot ulcer ([place])    "I've had them on both feet" (08/27/2012)  . Gouty arthritis    "right index finger" (08/27/2012)   . History of radiation therapy 06/09/13-07/06/13   left breast 50Gy  . Hyperlipidemia   . Hypertension   . Infection    right second toe  . Ischemic cardiomyopathy    a. 2011: EF 40-45%. b. EF 55-60% in 04/2017 but shortly after as inpatient was 45-50%.  Marland Kitchen LBBB (left bundle branch block)   . Mitral regurgitation   . Neuropathy    Hx; of B/L feet  . NSTEMI (non-ST elevated myocardial infarction) ([place]) 05/23/2009  . Osteomyelitis of foot ([place])   . PAD (peripheral artery disease) (H[facility])    a. LE PAD (patient previously elected hold off L fem-pop), prev followed by Dr. Bridgett Larsson  . PONV (postoperative nausea and vomiting)   . Sinus headache    "occasionally" (08/27/2012)  . Type II diabetes mellitus ([place])   . Vitamin D deficiency    Past Surgical History:  Procedure Laterality Date  . ABDOMINAL AORTOGRAM W/LOWER EXTREMITY Bilateral 03/25/2019   Procedure: ABDOMINAL AORTOGRAM W/LOWER EXTREMITY;  Surgeon: Marty Heck, MD;  Location: [facility] CV LAB;  Service: Cardiovascular;  Laterality: Bilateral;  . ABDOMINAL AORTOGRAM W/LOWER EXTREMITY Bilateral 02/24/2020   Procedure: ABDOMINAL AORTOGRAM W/LOWER EXTREMITY;  Surgeon: Cherre Robins, MD;  Location: [facility] CV LAB;  Service: Cardiovascular;  Laterality: Bilateral;  . AMPUTATION Right 08/27/2012   Procedure: AMPUTATION RAY;  Surgeon: Newt Minion, MD;  Location: [facility];  Service: Orthopedics;  Laterality: Right;  Right Foot 5th Ray Amputation  . AMPUTATION Left 07/08/2013   Procedure: AMPUTATION RAY;  Surgeon: Newt Minion, MD;  Location: [facility];  Service: Orthopedics;  Laterality: Left;  Left Foot 2nd Ray Amputation  . AMPUTATION Left 04/06/2019   Procedure: LEFT 5TH RAY AMPUTATION;  Surgeon: Newt Minion, MD;  Location: [facility];  Service: Orthopedics;  Laterality: Left;  . AMPUTATION RAY Right 08/27/2012   5th ray/notes 08/27/2012  . AMPUTATION TOE Right 03/06/2017   Procedure: AMPUTATION TOE, INTERPHANGEAL 2ND RIGHT;  Surgeon:  [name], DPM;  Location: [facility];  Service: Podiatry;  Laterality: Right;  . AV FISTULA PLACEMENT Right 11/11/2017   Procedure: RIGHT RADIOCEPHALIC  ARTERIOVENOUS FISTULA;  Surgeon: [name], MD;  Location: [facility];  Service: Vascular;  Laterality: Right;  . BREAST LUMPECTOMY  04/20/2013   with biopsy      DR WAKEFIELD  . BREAST LUMPECTOMY WITH NEEDLE LOCALIZATION AND AXILLARY SENTINEL LYMPH NODE BX Left 04/20/2013   Procedure: LEFT BREAST WIRE GUIDED LUMPECTOMY AND AXILLARY SENTINEL LYMPH NODE BX;  Surgeon: Rolm Bookbinder, MD;  Location: [facility];  Service: General;  Laterality: Left;  . CARDIAC CATHETERIZATION  05/24/2009   Archie Endo 05/24/2009 (08/27/2012)  . CATARACT EXTRACTION W/ INTRAOCULAR LENS  IMPLANT, BILATERAL  2000's  . COLONOSCOPY W/ BIOPSIES AND POLYPECTOMY  2010  . CORONARY ARTERY BYPASS GRAFT  2011   "CABG X4" (08/27/2012)  . DENTAL SURGERY  04/30/11   "1 implant" (08/27/2012)  . DILATION AND CURETTAGE OF UTERUS    . ENDARTERECTOMY FEMORAL Left 03/28/2019   Procedure: ENDARTERECTOMY FEMORAL;  Surgeon: [name], MD;  Location: [facility];  Service: Vascular;  Laterality: Left;  . ENDARTERECTOMY FEMORAL Right 03/16/2020   Procedure: RIGHT EXTERNAL ILIAC  ENDARTERECTOMY WITH PATCH ANGIOPLASTY;  Surgeon: [name], MD;  Location: [facility];  Service: Vascular;  Laterality: Right;  . EYE SURGERY    . FEMORAL-POPLITEAL BYPASS GRAFT Left 03/28/2019   Procedure: BYPASS GRAFT FEMORAL-POPLITEAL ARTERY;  Surgeon: [name], MD;  Location: [facility];  Service: Vascular;  Laterality: Left;  . FEMORAL-POPLITEAL BYPASS GRAFT Right 03/16/2020   Procedure: RIGHT COMMON FEMORAL TO ANTERIOR TIBIAL ARTERY BYPASS GRAFT USING PROPATEN GRAFT;  Surgeon: [name], MD;  Location: [facility];  Service: Vascular;  Laterality: Right;  . FINGER SURGERY Right    "index finger; turned out to be gout" (08/27/2012)  . IR FLUORO GUIDE CV LINE RIGHT  11/30/2017  . PATCH ANGIOPLASTY Left 03/28/2019    Procedure: Patch Angioplasty;  Surgeon: [name], MD;  Location: [facility];  Service: Vascular;  Laterality: Left;  . SHOULDER CLOSED REDUCTION Left 05/27/2019   Procedure: CLOSED REDUCTION LEFT SHOULDER;  Surgeon: Mcarthur Rossetti, MD;  Location: [facility];  Service: Orthopedics;  Laterality: Left;  . TEE WITHOUT CARDIOVERSION N/A 12/01/2017   Procedure: TRANSESOPHAGEAL ECHOCARDIOGRAM (TEE);  Surgeon: Lelon Perla, MD;  Location: [facility];  Service: Cardiovascular;  Laterality: N/A;    Allergies  Allergen Reactions  . Atenolol Rash and Other (See Comments)    Exacerbates gout   . Adhesive [Tape] Other (See Comments)    SKIN IS VERY SENSITIVE AND BRUISES AND TEARS EASILY; PLEASE USE AN ALTERNATIVE TO TAPE!!  . Contrast Media [Iodinated Diagnostic Agents] Rash  . Iohexol Rash  . Latex Rash    Outpatient Encounter Medications as of 03/28/2020  Medication Sig  . allopurinol (ZYLOPRIM) 100 MG tablet TAKE 1 TABLET TWICE DAILY TO PREVENT GOUT  . ARTIFICIAL TEAR SOLUTION OP Place 1 drop into both eyes 2 (two) times daily as needed (dry eyes). Refresh  . aspirin EC 81 MG tablet Take 81 mg by mouth daily as needed (Will not take if take Excedrin).   Marland Kitchen atorvastatin (LIPITOR) 10 MG tablet TAKE 1 TABLET DAILY FOR CHOLESTEROL.  Marland Kitchen b complex-vitamin c-folic acid (NEPHRO-VITE) 0.8 MG TABS tablet Take 1 tablet by mouth daily.   . cinacalcet (SENSIPAR) 30 MG tablet Take 1 tablet (30 mg total) by mouth daily with supper.  . cyclobenzaprine (FLEXERIL) 5 MG tablet Take 1 tablet (5 mg total) by mouth 3 (three) times daily as needed for muscle spasms.  Marland Kitchen docusate sodium (COLACE) 100 MG capsule Take 100 mg by mouth 2 (two) times daily.   . hydrALAZINE (APRESOLINE) 25 MG tablet Take 75 mg by mouth 3 (three) times daily. Take 3 tablets to = 75 mg TID  . HYDROcodone-acetaminophen (NORCO/VICODIN) 5-325 MG tablet Take 1 tablet by mouth every 6 (six) hours as needed.  . insulin NPH-regular Human (70-30)  100 UNIT/ML injection Inject 10 Units into the skin 2 (two) times daily with a meal.  . isosorbide dinitrate (ISORDIL) 10 MG tablet Take 10 mg by mouth 3 (three) times daily.  Marland Kitchen METAMUCIL FIBER PO Take 1 capsule by mouth daily as needed (Constipation).   . Multiple Vitamins-Minerals (PRESERVISION AREDS 2 PO) Take 2 capsules by mouth daily at 12 noon.   . sevelamer carbonate (RENVELA) 800 MG tablet Take 2 tablets (1,600 mg total) by mouth 3 (three) times daily with meals.  . traZODone (DESYREL) 50 MG tablet Take 25-50 mg by mouth at bedtime.  Marland Kitchen glucose blood (PRODIGY NO CODING BLOOD GLUC) test strip USE 1 STRIP TO CHECK GLUCOSE THREE TIMES DAILY-DX-E11.9 (Patient not taking: Reported on 03/28/2020)  . Insulin Pen Needle (BD PEN NEEDLE  NANO U/F) 32G X 4 MM MISC USE TO INJECT TWICE DAILY LANTUS INJECTIONS (Patient not taking: Reported on 03/28/2020)  . Insulin Syringe-Needle U-100 (BD INSULIN SYRINGE U/F) 31G X 5/16" 1 ML MISC Administer Insulin      3 x /day      with Meals      (Dx-e11.29) (Patient not taking: Reported on 03/28/2020)  . [DISCONTINUED] aspirin-acetaminophen-caffeine (EXCEDRIN EXTRA STRENGTH) 250-250-65 MG tablet Take 1 tablet by mouth 2 (two) times daily as needed (Low back pain/  will not take is take 81 mg asprin).   . [DISCONTINUED] Darbepoetin Alfa (ARANESP) 100 MCG/0.5ML SOSY injection Inject 0.5 mLs (100 mcg total) into the vein every Tuesday with hemodialysis.  . [DISCONTINUED] doxercalciferol (HECTOROL) 4 MCG/2ML injection Inject 2 mLs (4 mcg total) into the vein Every Tuesday,Thursday,and Saturday with dialysis.  . [DISCONTINUED] ferric gluconate 125 mg in sodium chloride 0.9 % 100 mL Inject 125 mg into the vein Every Tuesday,Thursday,and Saturday with dialysis.  . [DISCONTINUED] Homeopathic Products (THERAWORX RELIEF EX) Apply 1 application topically daily as needed (pain).  . [DISCONTINUED] hydrALAZINE (APRESOLINE) 25 MG tablet Take     3 tablets (75 mg)      3 x /day         for BP & Heart (Patient taking differently: Take     3 tablets (75 mg)      3 x /day        for BP & Heart)  . [DISCONTINUED] isosorbide dinitrate (ISORDIL) 10 MG tablet TAKE 1 TABLET THREE TIMES A DAY FOR BLOOD PRESSURE AND HEART (Patient taking differently: TAKE 1 TABLET THREE TIMES A DAY FOR BLOOD PRESSURE AND HEART)  . [DISCONTINUED] Methoxy PEG-Epoetin Beta (MIRCERA IJ) Mircera  . [DISCONTINUED] Probiotic Product ([place]erdinand) CAPS Take 1 capsule by mouth daily.   . [DISCONTINUED] SANTYL ointment Apply 1 application topically See admin instructions. Weekly or some time 2-3 times weekly   No facility-administered encounter medications on file as of 03/28/2020.    Review of Systems  GENERAL: No change in appetite, no fatigue, no weight changes, no fever, chills or weakness MOUTH and THROAT: Denies oral discomfort, gingival pain or bleeding RESPIRATORY: no cough, SOB, DOE, wheezing, hemoptysis CARDIAC: No chest pain, edema or palpitations GI: No abdominal pain, diarrhea, constipation, heart burn, nausea or vomiting GU: Denies dysuria, frequency, hematuria, incontinence, or discharge NEUROLOGICAL: Denies dizziness, syncope, numbness, or headache PSYCHIATRIC: Denies feelings of depression or anxiety. No report of hallucinations, insomnia, paranoia, or agitation    Immunization History  Administered Date(s) Administered  . Hepatitis B, adult 12/24/2017, 01/21/2018, 02/25/2018, 06/24/2018, 12/30/2018, 02/15/2019, 03/17/2019, 07/16/2019  . Influenza, High Dose Seasonal PF 01/30/2014, 02/14/2015, 12/17/2015, 01/22/2017, 01/07/2018, 12/23/2018, 02/13/2020  . Influenza-Unspecified 02/05/2013  . PFIZER SARS-COV-2 Vaccination 05/31/2019, 06/21/2019  . Pneumococcal Conjugate-13 01/30/2014  . Pneumococcal Polysaccharide-23 01/07/2011  . Td 01/13/2012  . Zoster 01/13/2012   Pertinent  Health Maintenance Due  Topic Date Due  . FOOT EXAM  01/09/2020  . COLONOSCOPY  03/08/2020  .  HEMOGLOBIN A1C  08/12/2020  . MAMMOGRAM  09/11/2020  . OPHTHALMOLOGY EXAM  11/17/2020  . INFLUENZA VACCINE  Completed  . DEXA SCAN  Completed  . PNA vac Low Risk Adult  Completed   Fall Risk  10/02/2019 06/15/2019 01/09/2019 03/22/2018 12/16/2017  Falls in the past year? 0 1 0 1 No  Number falls in past yr: - 1 - 0 -  Injury with Fall? - 1 - 0 -  Risk for  fall due to : No Fall Risks History of fall(s);Impaired balance/gait;Other (Comment) - - -  Follow up Education provided;Falls evaluation completed;Falls prevention discussed Falls evaluation completed;Falls prevention discussed;Education provided Falls evaluation completed;Education provided;Falls prevention discussed - -  Comment - doing PT - - -     Vitals:   03/28/20 1013  BP: (!) 150/95  Pulse: 92  Resp: 18  Temp: 97.8 F (36.6 C)  TempSrc: Oral  Weight: 149 lb 7.5 oz (67.8 kg)  Height: 5\' 4"  (1.626 m)   Body mass index is 25.66 kg/m.  Physical Exam  GENERAL APPEARANCE: Well nourished. In no acute distress. Normal body habitus SKIN:  Right inner lower leg surgical site is dry, no erythema; Right groin with bruise MOUTH and THROAT: Lips are without lesions. Oral mucosa is moist and without lesions. Tongue is normal in shape, size, and color and without lesions RESPIRATORY: Breathing is even & unlabored, BS CTAB CARDIAC: RRR, no murmur,no extra heart sounds, no edema GI: Abdomen soft, normal BS, no masses, no tenderness NEUROLOGICAL: There is no tremor. Speech is clear. Alert and oriented X 3. Does not directly answer questions, mild confusion.  PSYCHIATRIC:  Affect and behavior are appropriate  Labs reviewed: Recent Labs    06/15/19 1237 10/03/19 1154 02/13/20 1518 02/24/20 0957 03/22/20 0814 03/24/20 1051 03/27/20 0954  NA 138 140 140   < > 135 132* 130*  K 4.6 4.7 4.0   < > 4.1 4.7 5.1  CL 96* 93* 94*   < > 95* 93* 91*  CO2 30 35* 33*   < > 27 26 22   GLUCOSE 270* 88 118*   < > 139* 151* 97  BUN 61* 42* 54*    < > 41* 38* 43*  CREATININE 3.23* 3.91* 5.12*   < > 6.24* 6.18* 7.18*  CALCIUM 8.7 10.2 10.3   < > 9.3 10.1 10.7*  MG 2.5 2.8* 2.7*  --   --   --   --   PHOS  --   --   --    < > 4.2 4.9* 6.6*   < > = values in this interval not displayed.   Recent Labs    04/07/19 0248 04/09/19 1135 10/03/19 1154 02/13/20 1518 03/14/20 1421 03/17/20 0943 03/22/20 0814 03/24/20 1051 03/27/20 0954  AST 11*   < > 19 15 20   --   --   --   --   ALT 8   < > 18 12 17   --   --   --   --   ALKPHOS 86  --   --   --  182*  --   --   --   --   BILITOT 0.3   < > 0.5 0.7 0.8  --   --   --   --   PROT 5.2*   < > 6.3 6.5 6.5  --   --   --   --   ALBUMIN 2.4*   < >  --   --  3.6   < > 2.9* 2.8* 3.2*   < > = values in this interval not displayed.   Recent Labs    06/15/19 1237 10/03/19 1154 02/13/20 1518 02/24/20 0957 03/22/20 0814 03/24/20 1051 03/27/20 0954  WBC 8.8 7.5 7.3   < > 8.1 6.5 6.9  NEUTROABS 6,301 5,168 5,154  --   --   --   --   HGB 12.1 11.7 12.6   < >  8.3* 8.5* 10.0*  HCT 38.5 36.0 38.4   < > 25.0* 25.4* 30.9*  MCV 93.0 97.8 98.7   < > 99.6 100.4* 102.7*  PLT 247 224 171   < > 178 169 147*   < > = values in this interval not displayed.   Lab Results  Component Value Date   TSH 1.59 02/13/2020   Lab Results  Component Value Date   HGBA1C 5.4 02/13/2020   Lab Results  Component Value Date   CHOL 54 03/16/2020   HDL 24 (L) 03/16/2020   LDLCALC 21 03/16/2020   TRIG 47 03/16/2020   CHOLHDL 2.3 03/16/2020    Significant Diagnostic Results in last 30 days:  VAS [name] ABI WITH/WO TBI  Result Date: 03/19/2020 LOWER EXTREMITY DOPPLER STUDY Indications: Ulceration, peripheral artery disease, and post-op. High Risk Factors: Hypertension, hyperlipidemia.  Vascular Interventions: 03-16-2020 RT femoral-anterior tibial bypass and                         external iliac endarterectomy.                         03-28-2019 LT CFA endarterectomy and fem-pop bypass. Limitations: Today's exam was  limited due to involuntary patient movement and              bandages. Comparison Study: 02-20-2020 RT ABI 0.46 and LT ABI 1.73 ([facility]) Performing Technologist: Darlin Coco RDMS  Examination Guidelines: A complete evaluation includes at minimum, Doppler waveform signals and systolic blood pressure reading at the level of bilateral brachial, anterior tibial, and posterior tibial arteries, when vessel segments are accessible. Bilateral testing is considered an integral part of a complete examination. Photoelectric Plethysmograph (PPG) waveforms and toe systolic pressure readings are included as required and additional duplex testing as needed. Limited examinations for reoccurring indications may be performed as noted.  ABI Findings: +---------+------------------+-----+----------+--------------------------------+ Right    Rt Pressure (mmHg)IndexWaveform  Comment                          +---------+------------------+-----+----------+--------------------------------+ Brachial                        triphasic AVF                              +---------+------------------+-----+----------+--------------------------------+ PTA      84                0.68 monophasic                                 +---------+------------------+-----+----------+--------------------------------+ DP       134               1.08 monophasic                                 +---------+------------------+-----+----------+--------------------------------+ Great Toe                       Abnormal  Unable to obtain pressure due to  patient movement                 +---------+------------------+-----+----------+--------------------------------+ +---------+------------------+-----+---------+---------+ Left     Lt Pressure (mmHg)IndexWaveform Comment   +---------+------------------+-----+---------+---------+ Brachial 124                    triphasic           +---------+------------------+-----+---------+---------+ PTA      255               2.06 biphasic           +---------+------------------+-----+---------+---------+ DP       255               2.06 biphasic           +---------+------------------+-----+---------+---------+ Great Toe                                Bandaging +---------+------------------+-----+---------+---------+ +-------+-----------+-----------+------------+------------+ ABI/TBIToday's ABIToday's TBIPrevious ABIPrevious TBI +-------+-----------+-----------+------------+------------+ Right  1.08                  0.46        0.65         +-------+-----------+-----------+------------+------------+ Left   [place]                    1.73        1.65         +-------+-----------+-----------+------------+------------+ Right ABIs appear increased compared to prior study on 02-17-2020. Left ABIs appear essentially unchanged compared to prior study on 02-17-2020.  Summary: Right: Right ankle-brachial index is within normal limits. ABI appears improved frojm study completed 02-17-2020. Left: Resting left ankle-brachial index indicates noncompressible left lower extremity arteries.  *See table(s) above for measurements and observations.  Electronically signed by Ruta Hinds MD on 03/19/2020 at 7:38:01 PM.    Final     Assessment/Plan  1. Critical lower limb ischemia (HCC) -  S/P right external iliac and common femoral endarterectomy with dacron patch angioplasty and right femoral to anterior tibial bypass with 6 mm ringed propatent Gore-Tex graft on 03/16/20 -  Follow up with Dr. Donnetta Hutching, vascular surgeon, in 3 weeks -    Continue PRN Norco for pain and PRN cyclobenzaprine for muscle spasm  2. Gouty arthritis -  Continue Allopurinol  3. Diabetes mellitus type 2, insulin dependent (HCC) Lab Results  Component Value Date   HGBA1C 5.4 02/13/2020   - continue Insulin NPH-Regular 70-30  10 units BID  4. ESRD on  hemodialysis ([place]) -   On hemodialysis [facility] -   Continue sevelamer and Cinacalset  5. Coronary artery disease involving coronary bypass graft of native heart without angina pectoris -  Denies chest pain, continue isosorbide dinitrate, aspirin and atorvastatin  6. Essential hypertension -Continue hydralazine     Family/ staff Communication: Discussed plan of care with resident, husband and charge nurse.  Labs/tests ordered: None  Goals of care:   Short-term care   Durenda Age, DNP, MSN, FNP-BC [facility] [phone] (Monday-Friday 8:00 a.m. - 5:00 p.m.) [phone] (after hours)

## 2020-03-29 ENCOUNTER — Non-Acute Institutional Stay (SKILLED_NURSING_FACILITY): Payer: Medicare Other | Admitting: Internal Medicine

## 2020-03-29 ENCOUNTER — Encounter: Payer: Self-pay | Admitting: Internal Medicine

## 2020-03-29 DIAGNOSIS — E0822 Diabetes mellitus due to underlying condition with diabetic chronic kidney disease: Secondary | ICD-10-CM

## 2020-03-29 DIAGNOSIS — I10 Essential (primary) hypertension: Secondary | ICD-10-CM

## 2020-03-29 DIAGNOSIS — N186 End stage renal disease: Secondary | ICD-10-CM

## 2020-03-29 DIAGNOSIS — Z794 Long term (current) use of insulin: Secondary | ICD-10-CM | POA: Diagnosis not present

## 2020-03-29 DIAGNOSIS — I739 Peripheral vascular disease, unspecified: Secondary | ICD-10-CM

## 2020-03-29 DIAGNOSIS — D631 Anemia in chronic kidney disease: Secondary | ICD-10-CM

## 2020-03-29 DIAGNOSIS — N2581 Secondary hyperparathyroidism of renal origin: Secondary | ICD-10-CM | POA: Diagnosis not present

## 2020-03-29 DIAGNOSIS — Z992 Dependence on renal dialysis: Secondary | ICD-10-CM

## 2020-03-29 DIAGNOSIS — E441 Mild protein-calorie malnutrition: Secondary | ICD-10-CM

## 2020-03-29 DIAGNOSIS — D509 Iron deficiency anemia, unspecified: Secondary | ICD-10-CM | POA: Diagnosis not present

## 2020-03-29 NOTE — Assessment & Plan Note (Signed)
She is on twice daily 70/36 insulin.  Sliding scale will be avoided at the SNF based on recommendations of the medical literature.

## 2020-03-29 NOTE — Assessment & Plan Note (Signed)
BP controlled; no change in antihypertensive medications Some lability is expected with dialysis.

## 2020-03-29 NOTE — Assessment & Plan Note (Signed)
Hemoglobin 12.0 at HD today.

## 2020-03-29 NOTE — Assessment & Plan Note (Signed)
Labs from today's dialysis session revealed an albumin of 3.9.  Patient consulted SNF.

## 2020-03-29 NOTE — Patient Instructions (Signed)
See assessment and plan under each diagnosis in the problem list and acutely for this visit 

## 2020-03-29 NOTE — Assessment & Plan Note (Addendum)
Wound Care Nurse to monitor here at SNF.

## 2020-03-29 NOTE — Progress Notes (Signed)
NURSING HOME LOCATION:  [facility] ROOM NUMBER:  [id number]  CODE STATUS:  FULL CODE  PCP:  Unk Pinto, [place] [id number]  This is a comprehensive admission note to [facility] performed on this date less than 30 days from date of admission. Included are preadmission medical/surgical history; reconciled medication list; family history; social history and comprehensive review of systems.  Corrections and additions to the records were documented. Comprehensive physical exam was also performed. Additionally a clinical summary was entered for each active diagnosis pertinent to this admission in the Problem List to enhance continuity of care.  HPI: Patient was hospitalized 12/10-12/21/2021 with critical lower limb ischemia complicated by ulceration over the lateral aspect of the right foot.  She was followed at the wound care center and was referred to the vascular surgeon to assess adequacy of healing.  On 12/10 right external iliac and common femoral endarterectomy with Dacron patch angioplasty as well as right femoral to anterior tibial bypass with 6 mm ringed propatent Gore-Tex graft were performed by Dr. Sherren Mocha Early. Nephrology consulted for ongoing hemodialysis for ESRD. As there was no inpatient rehab available at [facility] or in [facility]; she was discharged to the SNF.   The patient was to be seen by Dr. Donnetta Hutching in the [place] office approximately 2-3 weeks following discharge.  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 Other medical diagnoses include dyslipidemia, diabetes with PVD and renal disease, mild protein caloric malnutrition, anemia of chronic disease, history of mitral valve mass, CAD S/P CABG, history of CHF, history of breast cancer S/P  lumpectomy,  & history of gout.  Social history: Rarely drinks wine by history, never smoked.  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  Family history: reviewed   Review of systems: Her major concern is difficulty operating the various devices in her room such as the TV, call button, and bed controls. She states that she is groggy having been to dialysis today.  She states that this affects her ability to respond optimally to my questions.  Because of the peripheral vascular disease she did not ambulate enough at home to have claudication.  She states that when she would ambulate she would use the furniture and walls for support and try to avoid any weightbearing on the affected limb.  She comprehends that the ulcers were related to "obstructions in my leg with low blood flow". She states that when she does  mobilize she does tend to be nauseated. She describes chronic constipation.   Additionally she describes chronic or constant low back pain.  She states that she has had peripheral edema but this is controlled with compression hose.   At home she was using a sliding scale 70/30 insulin regimen.  She would employ "10 up to 30 units daily "depending on her glucoses.  She states that typically the glucoses in the morning were in the high 90s and rarely greater than 120.  She states that she would ingest protein and carbs at bedtime to prevent low blood sugars. She describes oliguria with her ESRD. She has chronic postnasal drainage which does cause morning cough at times.  She has itchy eyes but no other extrinsic symptoms. When asked about  anxiety depression her response was "it depends on the day".  Constitutional: No fever, significant weight change  Eyes: No redness, discharge, pain, vision change ENT/mouth: No purulent discharge, earache, change in hearing, sore throat  Cardiovascular: No chest pain, palpitations,  paroxysmal nocturnal dyspnea  Respiratory: No cough, sputum production, hemoptysis,  significant snoring, apnea  Gastrointestinal: No heartburn, dysphagia, abdominal pain, vomiting, rectal bleeding, melena Genitourinary: No dysuria, hematuria, pyuria, incontinence, nocturia Dermatologic: No rash, pruritus Neurologic: No dizziness, headache, syncope, seizures Psychiatric: No  insomnia, anorexia Endocrine: No change in hair/skin/nails, excessive thirst, excessive hunger, excessive urination  Hematologic/lymphatic: No  lymphadenopathy, abnormal bleeding Allergy/immunology: No significant sneezing, urticaria, angioedema  Physical exam:  Pertinent or positive findings: Her speech pattern and speech content indicate she is well educated.  Responses were slightly slow intermittently, again which she related to being "groggy" post HD.  Dentition is beautiful.  She has a grade 1/2 systolic murmur @ the base.  Second heart sound is slightly increased.  She has minor rales at the bases exteriorly.  Pedal pulses are decreased.  They seem slightly stronger in the right lower extremity than the left but this is subtle.  Fistula is present over the right wrist.  She has scattered bruising of the upper extremities.  Amputations LLE not visualized due to foot wear.  General appearance: Adequately nourished; no acute distress, increased work of breathing is present.   Lymphatic: No lymphadenopathy about the head, neck, axilla. Eyes: No conjunctival inflammation or lid edema is present. There is no scleral icterus. Ears:  External ear exam shows no significant lesions or deformities.   Nose:  External nasal examination shows no deformity or inflammation. Nasal mucosa are pink and moist without lesions, exudates Oral exam: Lips and gums are healthy appearing.There is no oropharyngeal erythema or exudate. Neck:  No thyromegaly, masses, tenderness noted.    Heart:  Normal rate and regular rhythm. S1  normal without  gallop,  click, rub.  Lungs:  without wheezes, rhonchi,  rubs. Abdomen: Bowel sounds are normal.  Abdomen is soft and nontender with no organomegaly, hernias, masses. GU: Deferred  Extremities:  No cyanosis, clubbing, edema. Neurologic exam: Balance, Rhomberg, finger to nose testing could not be completed due to clinical state Skin: Warm & dry w/o tenting. No significant lesions or rash.  See clinical summary under each active problem in the Problem List with associated updated therapeutic plan

## 2020-04-01 DIAGNOSIS — D509 Iron deficiency anemia, unspecified: Secondary | ICD-10-CM | POA: Diagnosis not present

## 2020-04-01 DIAGNOSIS — N186 End stage renal disease: Secondary | ICD-10-CM | POA: Diagnosis not present

## 2020-04-01 DIAGNOSIS — D631 Anemia in chronic kidney disease: Secondary | ICD-10-CM | POA: Diagnosis not present

## 2020-04-01 DIAGNOSIS — N2581 Secondary hyperparathyroidism of renal origin: Secondary | ICD-10-CM | POA: Diagnosis not present

## 2020-04-01 DIAGNOSIS — Z992 Dependence on renal dialysis: Secondary | ICD-10-CM | POA: Diagnosis not present

## 2020-04-02 ENCOUNTER — Telehealth: Payer: Self-pay | Admitting: *Deleted

## 2020-04-02 NOTE — Telephone Encounter (Signed)
Theresia Bough from [facility] called she states the esgar tissue on patients heel and right lateral foot is sloughing off. She states right leg swollen but no redness or heat. Surgical sites look good. No fever. She wants to know if we need to see patient sooner than her appt scheduled with Dr Donnetta Hutching 04/16/20. Advised her patient can keep current appt. If patient develops any redness or drainage at surgical sites,fever or excessive leg swellling to call back. She verbalized understanding.

## 2020-04-03 DIAGNOSIS — D631 Anemia in chronic kidney disease: Secondary | ICD-10-CM | POA: Diagnosis not present

## 2020-04-03 DIAGNOSIS — N186 End stage renal disease: Secondary | ICD-10-CM | POA: Diagnosis not present

## 2020-04-03 DIAGNOSIS — N2581 Secondary hyperparathyroidism of renal origin: Secondary | ICD-10-CM | POA: Diagnosis not present

## 2020-04-03 DIAGNOSIS — D509 Iron deficiency anemia, unspecified: Secondary | ICD-10-CM | POA: Diagnosis not present

## 2020-04-03 DIAGNOSIS — Z992 Dependence on renal dialysis: Secondary | ICD-10-CM | POA: Diagnosis not present

## 2020-04-05 DIAGNOSIS — N186 End stage renal disease: Secondary | ICD-10-CM | POA: Diagnosis not present

## 2020-04-05 DIAGNOSIS — D509 Iron deficiency anemia, unspecified: Secondary | ICD-10-CM | POA: Diagnosis not present

## 2020-04-05 DIAGNOSIS — D631 Anemia in chronic kidney disease: Secondary | ICD-10-CM | POA: Diagnosis not present

## 2020-04-05 DIAGNOSIS — N2581 Secondary hyperparathyroidism of renal origin: Secondary | ICD-10-CM | POA: Diagnosis not present

## 2020-04-05 DIAGNOSIS — Z992 Dependence on renal dialysis: Secondary | ICD-10-CM | POA: Diagnosis not present

## 2020-04-07 DIAGNOSIS — I10 Essential (primary) hypertension: Secondary | ICD-10-CM | POA: Diagnosis not present

## 2020-04-07 DIAGNOSIS — E1151 Type 2 diabetes mellitus with diabetic peripheral angiopathy without gangrene: Secondary | ICD-10-CM | POA: Diagnosis present

## 2020-04-07 DIAGNOSIS — Z992 Dependence on renal dialysis: Secondary | ICD-10-CM | POA: Diagnosis not present

## 2020-04-07 DIAGNOSIS — I340 Nonrheumatic mitral (valve) insufficiency: Secondary | ICD-10-CM | POA: Diagnosis not present

## 2020-04-07 DIAGNOSIS — R69 Illness, unspecified: Secondary | ICD-10-CM | POA: Diagnosis not present

## 2020-04-07 DIAGNOSIS — N2581 Secondary hyperparathyroidism of renal origin: Secondary | ICD-10-CM | POA: Diagnosis present

## 2020-04-07 DIAGNOSIS — E11319 Type 2 diabetes mellitus with unspecified diabetic retinopathy without macular edema: Secondary | ICD-10-CM | POA: Diagnosis not present

## 2020-04-07 DIAGNOSIS — E1142 Type 2 diabetes mellitus with diabetic polyneuropathy: Secondary | ICD-10-CM | POA: Diagnosis not present

## 2020-04-07 DIAGNOSIS — I890 Lymphedema, not elsewhere classified: Secondary | ICD-10-CM | POA: Diagnosis not present

## 2020-04-07 DIAGNOSIS — Z20822 Contact with and (suspected) exposure to covid-19: Secondary | ICD-10-CM | POA: Diagnosis present

## 2020-04-07 DIAGNOSIS — L97516 Non-pressure chronic ulcer of other part of right foot with bone involvement without evidence of necrosis: Secondary | ICD-10-CM | POA: Diagnosis not present

## 2020-04-07 DIAGNOSIS — Z7189 Other specified counseling: Secondary | ICD-10-CM | POA: Diagnosis not present

## 2020-04-07 DIAGNOSIS — I5042 Chronic combined systolic (congestive) and diastolic (congestive) heart failure: Secondary | ICD-10-CM | POA: Diagnosis present

## 2020-04-07 DIAGNOSIS — R404 Transient alteration of awareness: Secondary | ICD-10-CM | POA: Diagnosis not present

## 2020-04-07 DIAGNOSIS — E0822 Diabetes mellitus due to underlying condition with diabetic chronic kidney disease: Secondary | ICD-10-CM | POA: Diagnosis not present

## 2020-04-07 DIAGNOSIS — I361 Nonrheumatic tricuspid (valve) insufficiency: Secondary | ICD-10-CM | POA: Diagnosis not present

## 2020-04-07 DIAGNOSIS — I517 Cardiomegaly: Secondary | ICD-10-CM | POA: Diagnosis not present

## 2020-04-07 DIAGNOSIS — E441 Mild protein-calorie malnutrition: Secondary | ICD-10-CM | POA: Diagnosis not present

## 2020-04-07 DIAGNOSIS — J90 Pleural effusion, not elsewhere classified: Secondary | ICD-10-CM | POA: Diagnosis not present

## 2020-04-07 DIAGNOSIS — Z515 Encounter for palliative care: Secondary | ICD-10-CM | POA: Diagnosis not present

## 2020-04-07 DIAGNOSIS — I70229 Atherosclerosis of native arteries of extremities with rest pain, unspecified extremity: Secondary | ICD-10-CM | POA: Diagnosis not present

## 2020-04-07 DIAGNOSIS — L8990 Pressure ulcer of unspecified site, unspecified stage: Secondary | ICD-10-CM | POA: Diagnosis not present

## 2020-04-07 DIAGNOSIS — Z8051 Family history of malignant neoplasm of kidney: Secondary | ICD-10-CM | POA: Diagnosis not present

## 2020-04-07 DIAGNOSIS — R5381 Other malaise: Secondary | ICD-10-CM | POA: Diagnosis not present

## 2020-04-07 DIAGNOSIS — L97512 Non-pressure chronic ulcer of 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d: Secondary | ICD-10-CM | POA: Diagnosis not present

## 2020-04-07 DIAGNOSIS — E8889 Other specified metabolic disorders: Secondary | ICD-10-CM | POA: Diagnosis present

## 2020-04-07 DIAGNOSIS — R41841 Cognitive communication deficit: Secondary | ICD-10-CM | POA: Diagnosis not present

## 2020-04-07 DIAGNOSIS — L89311 Pressure ulcer of right buttock, stage 1: Secondary | ICD-10-CM | POA: Diagnosis present

## 2020-04-07 DIAGNOSIS — L97812 Non-pressure chronic ulcer of other part of right lower leg with fat layer exposed: Secondary | ICD-10-CM | POA: Diagnosis not present

## 2020-04-07 DIAGNOSIS — I872 Venous insufficiency (chronic) (peripheral): Secondary | ICD-10-CM | POA: Diagnosis not present

## 2020-04-07 DIAGNOSIS — L97518 Non-pressure chronic ulcer of other part of right foot with other specified severity: Secondary | ICD-10-CM | POA: Diagnosis not present

## 2020-04-07 DIAGNOSIS — R040 Epistaxis: Secondary | ICD-10-CM | POA: Diagnosis present

## 2020-04-07 DIAGNOSIS — Z808 Family history of malignant neoplasm of other organs or systems: Secondary | ICD-10-CM | POA: Diagnosis not present

## 2020-04-07 DIAGNOSIS — R531 Weakness: Secondary | ICD-10-CM | POA: Diagnosis not present

## 2020-04-07 DIAGNOSIS — Z89421 Acquired absence of other right toe(s): Secondary | ICD-10-CM | POA: Diagnosis not present

## 2020-04-07 DIAGNOSIS — E43 Unspecified severe protein-calorie malnutrition: Secondary | ICD-10-CM | POA: Diagnosis present

## 2020-04-07 DIAGNOSIS — I2510 Atherosclerotic heart disease of native coronary artery without angina pectoris: Secondary | ICD-10-CM | POA: Diagnosis present

## 2020-04-07 DIAGNOSIS — D631 Anemia in chronic kidney disease: Secondary | ICD-10-CM | POA: Diagnosis present

## 2020-04-07 DIAGNOSIS — M6281 Muscle weakness (generalized): Secondary | ICD-10-CM | POA: Diagnosis not present

## 2020-04-07 DIAGNOSIS — I9789 Other postprocedural complications and disorders of the circulatory system, not elsewhere classified: Secondary | ICD-10-CM | POA: Diagnosis not present

## 2020-04-07 DIAGNOSIS — I739 Peripheral vascular disease, unspecified: Secondary | ICD-10-CM | POA: Diagnosis not present

## 2020-04-07 DIAGNOSIS — E785 Hyperlipidemia, unspecified: Secondary | ICD-10-CM | POA: Diagnosis present

## 2020-04-07 DIAGNOSIS — J9811 Atelectasis: Secondary | ICD-10-CM | POA: Diagnosis not present

## 2020-04-07 DIAGNOSIS — E119 Type 2 diabetes mellitus without complications: Secondary | ICD-10-CM | POA: Diagnosis not present

## 2020-04-07 DIAGNOSIS — T827XXA Infection and inflammatory reaction due to other cardiac and vascular devices, implants and grafts, initial encounter: Secondary | ICD-10-CM | POA: Diagnosis present

## 2020-04-07 DIAGNOSIS — I120 Hypertensive chronic kidney disease with stage 5 chronic kidney disease or end stage renal disease: Secondary | ICD-10-CM | POA: Diagnosis not present

## 2020-04-07 DIAGNOSIS — E11649 Type 2 diabetes mellitus with hypoglycemia without coma: Secondary | ICD-10-CM | POA: Diagnosis not present

## 2020-04-07 DIAGNOSIS — Y832 Surgical operation with anastomosis, bypass or graft as the cause of abnormal reaction of the patient, or of later complication, without mention of misadventure at the time of the procedure: Secondary | ICD-10-CM | POA: Diagnosis present

## 2020-04-07 DIAGNOSIS — I639 Cerebral infarction, unspecified: Secondary | ICD-10-CM | POA: Diagnosis not present

## 2020-04-07 DIAGNOSIS — Z794 Long term (current) use of insulin: Secondary | ICD-10-CM | POA: Diagnosis not present

## 2020-04-07 DIAGNOSIS — Z48812 Encounter for surgical aftercare following surgery on the circulatory system: Secondary | ICD-10-CM | POA: Diagnosis not present

## 2020-04-07 DIAGNOSIS — G9341 Metabolic encephalopathy: Secondary | ICD-10-CM | POA: Diagnosis present

## 2020-04-07 DIAGNOSIS — R2681 Unsteadiness on feet: Secondary | ICD-10-CM | POA: Diagnosis not present

## 2020-04-07 DIAGNOSIS — D509 Iron deficiency anemia, unspecified: Secondary | ICD-10-CM | POA: Diagnosis not present

## 2020-04-07 DIAGNOSIS — E559 Vitamin D deficiency, unspecified: Secondary | ICD-10-CM | POA: Diagnosis not present

## 2020-04-07 DIAGNOSIS — Z66 Do not resuscitate: Secondary | ICD-10-CM | POA: Diagnosis not present

## 2020-04-07 DIAGNOSIS — Z9889 Other specified postprocedural states: Secondary | ICD-10-CM | POA: Diagnosis not present

## 2020-04-07 DIAGNOSIS — E11621 Type 2 diabetes mellitus with foot ulcer: Secondary | ICD-10-CM | POA: Diagnosis present

## 2020-04-07 DIAGNOSIS — T8149XA Infection following a procedure, other surgical site, initial encounter: Secondary | ICD-10-CM | POA: Diagnosis not present

## 2020-04-07 DIAGNOSIS — N186 End stage renal disease: Secondary | ICD-10-CM | POA: Diagnosis not present

## 2020-04-07 DIAGNOSIS — L97419 Non-pressure chronic ulcer of right heel and midfoot with unspecified severity: Secondary | ICD-10-CM | POA: Diagnosis present

## 2020-04-07 DIAGNOSIS — I255 Ischemic cardiomyopathy: Secondary | ICD-10-CM | POA: Diagnosis present

## 2020-04-07 DIAGNOSIS — L97412 Non-pressure chronic ulcer of right heel and midfoot with fat layer exposed: Secondary | ICD-10-CM | POA: Diagnosis not present

## 2020-04-07 DIAGNOSIS — I132 Hypertensive heart and chronic kidney disease with heart failure and with stage 5 chronic kidney disease, or end stage renal disease: Secondary | ICD-10-CM | POA: Diagnosis present

## 2020-04-07 DIAGNOSIS — I25810 Atherosclerosis of coronary artery bypass graft(s) without angina pectoris: Secondary | ICD-10-CM | POA: Diagnosis not present

## 2020-04-07 DIAGNOSIS — E1122 Type 2 diabetes mellitus with diabetic chronic kidney disease: Secondary | ICD-10-CM | POA: Diagnosis not present

## 2020-04-07 DIAGNOSIS — Z9641 Presence of insulin pump (external) (internal): Secondary | ICD-10-CM | POA: Diagnosis not present

## 2020-04-07 DIAGNOSIS — I272 Pulmonary hypertension, unspecified: Secondary | ICD-10-CM | POA: Diagnosis not present

## 2020-04-07 DIAGNOSIS — J811 Chronic pulmonary edema: Secondary | ICD-10-CM | POA: Diagnosis not present

## 2020-04-08 DIAGNOSIS — T8149XA Infection following a procedure, other surgical site, initial encounter: Secondary | ICD-10-CM | POA: Diagnosis not present

## 2020-04-08 DIAGNOSIS — D509 Iron deficiency anemia, unspecified: Secondary | ICD-10-CM | POA: Diagnosis not present

## 2020-04-08 DIAGNOSIS — N2581 Secondary hyperparathyroidism of renal origin: Secondary | ICD-10-CM | POA: Diagnosis not present

## 2020-04-08 DIAGNOSIS — N186 End stage renal disease: Secondary | ICD-10-CM | POA: Diagnosis not present

## 2020-04-08 DIAGNOSIS — D631 Anemia in chronic kidney disease: Secondary | ICD-10-CM | POA: Diagnosis not present

## 2020-04-08 DIAGNOSIS — Z992 Dependence on renal dialysis: Secondary | ICD-10-CM | POA: Diagnosis not present

## 2020-04-10 ENCOUNTER — Encounter: Payer: Self-pay | Admitting: Adult Health

## 2020-04-10 ENCOUNTER — Non-Acute Institutional Stay (SKILLED_NURSING_FACILITY): Payer: Medicare Other | Admitting: Adult Health

## 2020-04-10 DIAGNOSIS — E0822 Diabetes mellitus due to underlying condition with diabetic chronic kidney disease: Secondary | ICD-10-CM | POA: Diagnosis not present

## 2020-04-10 DIAGNOSIS — Z992 Dependence on renal dialysis: Secondary | ICD-10-CM

## 2020-04-10 DIAGNOSIS — I25810 Atherosclerosis of coronary artery bypass graft(s) without angina pectoris: Secondary | ICD-10-CM

## 2020-04-10 DIAGNOSIS — N186 End stage renal disease: Secondary | ICD-10-CM | POA: Diagnosis not present

## 2020-04-10 DIAGNOSIS — Z794 Long term (current) use of insulin: Secondary | ICD-10-CM

## 2020-04-10 DIAGNOSIS — T8149XA Infection following a procedure, other surgical site, initial encounter: Secondary | ICD-10-CM | POA: Diagnosis not present

## 2020-04-10 DIAGNOSIS — I10 Essential (primary) hypertension: Secondary | ICD-10-CM | POA: Diagnosis not present

## 2020-04-10 DIAGNOSIS — D631 Anemia in chronic kidney disease: Secondary | ICD-10-CM | POA: Diagnosis not present

## 2020-04-10 DIAGNOSIS — I739 Peripheral vascular disease, unspecified: Secondary | ICD-10-CM | POA: Diagnosis not present

## 2020-04-10 DIAGNOSIS — D509 Iron deficiency anemia, unspecified: Secondary | ICD-10-CM | POA: Diagnosis not present

## 2020-04-10 DIAGNOSIS — N2581 Secondary hyperparathyroidism of renal origin: Secondary | ICD-10-CM | POA: Diagnosis not present

## 2020-04-10 NOTE — Progress Notes (Unsigned)
Location:  [place] Room Number: 124 A Place of Service:  SNF (31) Provider:  Durenda Age, DNP, FNP-BC  Patient Care Team: Unk Pinto, MD as PCP - General (Internal Medicine) Josue Hector, MD as PCP - Cardiology (Cardiology) Magrinat, Virgie Dad, MD as Consulting Physician (Oncology) Kyung Rudd, MD as Consulting Physician (Radiation Oncology) Newt Minion, MD as Consulting Physician (Orthopedic Surgery) Philemon Kingdom, MD as Consulting Physician (Internal Medicine) Josue Hector, MD as Consulting Physician (Cardiology) Inda Castle, MD (Inactive) as Consulting Physician (Gastroenterology) Rolm Bookbinder, MD as Consulting Physician (General Surgery) Calvert Cantor, MD as Consulting Physician (Ophthalmology) Madelon Lips, MD as Consulting Physician (Nephrology)  Extended Emergency Contact Information Primary Emergency Contact: [facility] Address: [address]          [place], [place] [address] Johnnette Litter of [place] Phone: [phone] Mobile Phone: [phone] Relation: Spouse Secondary Emergency Contact: [place] of [place] Phone: [phone] Mobile Phone: [phone] Relation: Son  Code Status:  Full Code  Goals of care: Advanced Directive information Advanced Directives 03/16/2020  Does Patient Have a Medical Advance Directive? No  Type of Advance Directive -  Does patient want to make changes to medical advance directive? No - Patient declined  Copy of [place] in Chart? -  Would patient like information on creating a medical advance directive? No - Patient declined  Pre-existing out of facility DNR order (yellow form or pink MOST form) -     Chief Complaint  Patient presents with  . Acute Visit    Short-term Rehab Visit     HPI:  Pt is a 75 y.o. female seen today for medical management of chronic diseases. She is a short-term care resident of  [facility].  She has a PMH of breast cancer,CAD, systolic and diastolic CHF, hyperlipidemia, hypertension, ischemic cardiomyopathy, mitral regurgitation, NSTEMI, PAD and vitamin D deficiency. She was admitted to [facility] on 03/27/20 post [facility] hospitalization 03/16/20 to 03/27/20 follow-up repeat call lower leg ischemia. She underwent arteriography on 03/25/2019 which revealed bilateral superficial femoral artery occlusions. She had left femoral to below-knee popliteal bypass with saphenous vein. She underwent toe amputation with Dr. Sharol Given and eventually healed. She then developed ulceration over the lateral aspect of her right foot. She was initially cared for at the wound center and was evaluated for adequacy of healing. She was then taken to the operating room on 03/16/2020 for right external iliac and common femoral endarterectomy with dacryon patch angioplasty and right femoral to anterior tibial bypass.  She was seen in her room today. She denies having pain on her RLE surgical wounds. CBGs ranging from 110-211. She takes insulin NPH 70-3010 units twice a day for diabetes mellitus. She goes to hemodialysis on Tuesdays, Thursdays and Saturdays. She takes sevelamer and cinacalcet for ESRD.   Past Medical History:  Diagnosis Date  . Allergy   . Anterior dislocation of left shoulder 05/27/2019  . Arthritis    "maybe in my lower back" (08/27/2012)  . 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 Chronic combined systolic and diastolic CHF (congestive heart failure) ([place])   . CKD (chronic kidney disease), stage IV ([place])   . Coronary artery disease    a. NSTEMI in 05/2009 s/p CABG (LIMA-LAD, SVG-diagonal, SVG-OM1/OM 2).   Marland Kitchen Dyspnea    with exertion  . Family history of anesthesia complication    Mom has a hard  time to wake up  . Foot ulcer ([place])    "I've had them on both feet" (08/27/2012)  . Gouty arthritis    "right index finger"  (08/27/2012)  . History of radiation therapy 06/09/13-07/06/13   left breast 50Gy  . Hyperlipidemia   . Hypertension   . Infection    right second toe  . Ischemic cardiomyopathy    a. 2011: EF 40-45%. b. EF 55-60% in 04/2017 but shortly after as inpatient was 45-50%.  Marland Kitchen LBBB (left bundle branch block)   . Mitral regurgitation   . Neuropathy    Hx; of B/L feet  . NSTEMI (non-ST elevated myocardial infarction) ([place]) 05/23/2009  . Osteomyelitis of foot ([place])   . PAD (peripheral artery disease) (HCC)    a. LE PAD (patient previously elected hold off L fem-pop), prev followed by Dr. Bridgett Larsson  . PONV (postoperative nausea and vomiting)   . Sinus headache    "occasionally" (08/27/2012)  . Type II diabetes mellitus ([place])   . Vitamin D deficiency    Past Surgical History:  Procedure Laterality Date  . ABDOMINAL AORTOGRAM W/LOWER EXTREMITY Bilateral 03/25/2019   Procedure: ABDOMINAL AORTOGRAM W/LOWER EXTREMITY;  Surgeon: Marty Heck, MD;  Location: [place] CV LAB;  Service: Cardiovascular;  Laterality: Bilateral;  . ABDOMINAL AORTOGRAM W/LOWER EXTREMITY Bilateral 02/24/2020   Procedure: ABDOMINAL AORTOGRAM W/LOWER EXTREMITY;  Surgeon: Cherre Robins, MD;  Location: [place] [facility];  Service: Cardiovascular;  Laterality: Bilateral;  . AMPUTATION Right 08/27/2012   Procedure: AMPUTATION RAY;  Surgeon: Newt Minion, MD;  Location: [place];  Service: Orthopedics;  Laterality: Right;  Right Foot 5th Ray Amputation  . AMPUTATION Left 07/08/2013   Procedure: AMPUTATION RAY;  Surgeon: Newt Minion, MD;  Location: [place];  Service: Orthopedics;  Laterality: Left;  Left Foot 2nd Ray Amputation  . AMPUTATION Left 04/06/2019   Procedure: LEFT 5TH RAY AMPUTATION;  Surgeon: Newt Minion, MD;  Location: [place];  Service: Orthopedics;  Laterality: Left;  . AMPUTATION RAY Right 08/27/2012   5th ray/notes 08/27/2012  . AMPUTATION TOE Right 03/06/2017   Procedure: AMPUTATION TOE, INTERPHANGEAL 2ND RIGHT;   Surgeon: [name], DPM;  Location: [place];  Service: Podiatry;  Laterality: Right;  . AV FISTULA PLACEMENT Right 11/11/2017   Procedure: RIGHT RADIOCEPHALIC  ARTERIOVENOUS FISTULA;  Surgeon: [name], MD;  Location: [place];  Service: Vascular;  Laterality: Right;  . BREAST LUMPECTOMY  04/20/2013   with biopsy      DR WAKEFIELD  . BREAST LUMPECTOMY WITH NEEDLE LOCALIZATION AND AXILLARY SENTINEL LYMPH NODE BX Left 04/20/2013   Procedure: LEFT BREAST WIRE GUIDED LUMPECTOMY AND AXILLARY SENTINEL LYMPH NODE BX;  Surgeon: Rolm Bookbinder, MD;  Location: [place];  Service: General;  Laterality: Left;  . CARDIAC CATHETERIZATION  05/24/2009   Archie Endo 05/24/2009 (08/27/2012)  . CATARACT EXTRACTION W/ INTRAOCULAR LENS  IMPLANT, BILATERAL  2000's  . COLONOSCOPY W/ BIOPSIES AND POLYPECTOMY  2010  . CORONARY ARTERY BYPASS GRAFT  2011   "CABG X4" (08/27/2012)  . DENTAL SURGERY  04/30/11   "1 implant" (08/27/2012)  . DILATION AND CURETTAGE OF UTERUS    . ENDARTERECTOMY FEMORAL Left 03/28/2019   Procedure: ENDARTERECTOMY FEMORAL;  Surgeon: [name], MD;  Location: [place];  Service: Vascular;  Laterality: Left;  . ENDARTERECTOMY FEMORAL Right 03/16/2020   Procedure: RIGHT EXTERNAL ILIAC  ENDARTERECTOMY WITH PATCH ANGIOPLASTY;  Surgeon: [name], MD;  Location: [place];  Service:  Vascular;  Laterality: Right;  . EYE SURGERY    . FEMORAL-POPLITEAL BYPASS GRAFT Left 03/28/2019   Procedure: BYPASS GRAFT FEMORAL-POPLITEAL ARTERY;  Surgeon: [name], MD;  Location: [place];  Service: Vascular;  Laterality: Left;  . FEMORAL-POPLITEAL BYPASS GRAFT Right 03/16/2020   Procedure: RIGHT COMMON FEMORAL TO ANTERIOR TIBIAL ARTERY BYPASS GRAFT USING PROPATEN GRAFT;  Surgeon: [name], MD;  Location: [place];  Service: Vascular;  Laterality: Right;  . FINGER SURGERY Right    "index finger; turned out to be gout" (08/27/2012)  . IR FLUORO GUIDE CV LINE RIGHT  11/30/2017  . PATCH ANGIOPLASTY Left 03/28/2019    Procedure: Patch Angioplasty;  Surgeon: [name], MD;  Location: [place];  Service: Vascular;  Laterality: Left;  . SHOULDER CLOSED REDUCTION Left 05/27/2019   Procedure: CLOSED REDUCTION LEFT SHOULDER;  Surgeon: Mcarthur Rossetti, MD;  Location: [facility];  Service: Orthopedics;  Laterality: Left;  . TEE WITHOUT CARDIOVERSION N/A 12/01/2017   Procedure: TRANSESOPHAGEAL ECHOCARDIOGRAM (TEE);  Surgeon: Lelon Perla, MD;  Location: [facility] ENDOSCOPY;  Service: Cardiovascular;  Laterality: N/A;    Allergies  Allergen Reactions  . Atenolol Rash and Other (See Comments)    Exacerbates gout   . Adhesive [Tape] Other (See Comments)    SKIN IS VERY SENSITIVE AND BRUISES AND TEARS EASILY; PLEASE USE AN ALTERNATIVE TO TAPE!!  . Contrast Media [Iodinated Diagnostic Agents] Rash  . Iohexol Rash  . Latex Rash    Outpatient Encounter Medications as of 04/10/2020  Medication Sig  . allopurinol (ZYLOPRIM) 100 MG tablet TAKE 1 TABLET TWICE DAILY TO PREVENT GOUT  . ARTIFICIAL TEAR SOLUTION OP Place 1 drop into both eyes 2 (two) times daily as needed (dry eyes). Refresh  . aspirin EC 81 MG tablet Take 81 mg by mouth daily as needed (Will not take if take Excedrin).   Marland Kitchen atorvastatin (LIPITOR) 10 MG tablet TAKE 1 TABLET DAILY FOR CHOLESTEROL.  Marland Kitchen b complex-vitamin c-folic acid (NEPHRO-VITE) 0.8 MG TABS tablet Take 1 tablet by mouth daily.   . bisacodyl (DULCOLAX) 10 MG suppository Place 10 mg rectally as needed for moderate constipation.  . cinacalcet (SENSIPAR) 30 MG tablet Take 1 tablet (30 mg total) by mouth daily with supper.  . cyclobenzaprine (FLEXERIL) 5 MG tablet Take 1 tablet (5 mg total) by mouth 3 (three) times daily as needed for muscle spasms.  Marland Kitchen docusate sodium (COLACE) 100 MG capsule Take 100 mg by mouth 2 (two) times daily.   Marland Kitchen glucose blood (PRODIGY NO CODING BLOOD GLUC) test strip USE 1 STRIP TO CHECK GLUCOSE THREE TIMES DAILY-DX-E11.9  . hydrALAZINE (APRESOLINE) 25 MG tablet Take 75 mg  by mouth 3 (three) times daily. Take 3 tablets to = 75 mg TID  . HYDROcodone-acetaminophen (NORCO/VICODIN) 5-325 MG tablet Take 1 tablet by mouth every 6 (six) hours as needed.  . insulin NPH-regular Human (70-30) 100 UNIT/ML injection Inject 10 Units into the skin 2 (two) times daily with a meal.  . Insulin Pen Needle (BD PEN NEEDLE NANO U/F) 32G X 4 MM MISC USE TO INJECT TWICE DAILY LANTUS INJECTIONS  . Insulin Syringe-Needle U-100 (BD INSULIN SYRINGE U/F) 31G X 5/16" 1 ML MISC Administer Insulin      3 x /day      with Meals      (Dx-e11.29) (Patient taking differently: Administer Insulin      3 x /day      with Meals      (  Dx-e11.29))  . isosorbide dinitrate (ISORDIL) 10 MG tablet Take 10 mg by mouth 3 (three) times daily.  Marland Kitchen METAMUCIL FIBER PO Take 1 capsule by mouth daily as needed (Constipation).   . Multiple Vitamins-Minerals (PRESERVISION AREDS 2 PO) Take 2 capsules by mouth daily at 12 noon.   . Nutritional Supplements (NEPRO) LIQD Take by mouth 2 (two) times daily.  . sevelamer carbonate (RENVELA) 800 MG tablet Take 2 tablets (1,600 mg total) by mouth 3 (three) times daily with meals.  . traZODone (DESYREL) 50 MG tablet Take 25-50 mg by mouth at bedtime.   No facility-administered encounter medications on file as of 04/10/2020.    Review of Systems  GENERAL: No change in appetite, no fatigue, no weight changes, no fever, chills or weakness MOUTH and THROAT: Denies oral discomfort, gingival pain or bleeding RESPIRATORY: no cough, SOB, DOE, wheezing, hemoptysis CARDIAC: No chest pain, edema or palpitations GI: No abdominal pain, diarrhea, constipation, heart burn, nausea or vomiting NEUROLOGICAL: Denies dizziness, syncope, numbness, or headache PSYCHIATRIC: Denies feelings of depression or anxiety. No report of hallucinations, insomnia, paranoia, or agitation   Immunization History  Administered Date(s) Administered  . Hepatitis B, adult 12/24/2017, 01/21/2018, 02/25/2018,  06/24/2018, 12/30/2018, 02/15/2019, 03/17/2019, 07/16/2019  . Influenza, High Dose Seasonal PF 01/30/2014, 02/14/2015, 12/17/2015, 01/22/2017, 01/07/2018, 12/23/2018, 02/13/2020  . Influenza-Unspecified 02/05/2013  . PFIZER SARS-COV-2 Vaccination 05/31/2019, 06/21/2019  . Pneumococcal Conjugate-13 01/30/2014  . Pneumococcal Polysaccharide-23 01/07/2011  . Td 01/13/2012  . Zoster 01/13/2012   Pertinent  Health Maintenance Due  Topic Date Due  . FOOT EXAM  01/09/2020  . COLONOSCOPY (Pts 45-5yrs Insurance coverage will need to be confirmed)  03/08/2020  . HEMOGLOBIN A1C  08/12/2020  . MAMMOGRAM  09/11/2020  . OPHTHALMOLOGY EXAM  11/17/2020  . INFLUENZA VACCINE  Completed  . DEXA SCAN  Completed  . PNA vac Low Risk Adult  Completed   Fall Risk  10/02/2019 06/15/2019 01/09/2019 03/22/2018 12/16/2017  Falls in the past year? 0 1 0 1 No  Number falls in past yr: - 1 - 0 -  Injury with Fall? - 1 - 0 -  Risk for fall due to : No Fall Risks History of fall(s);Impaired balance/gait;Other (Comment) - - -  Follow up Education provided;Falls evaluation completed;Falls prevention discussed Falls evaluation completed;Falls prevention discussed;Education provided Falls evaluation completed;Education provided;Falls prevention discussed - -  Comment - doing PT - - -     Vitals:   04/10/20 1623  BP: 117/65  Pulse: 66  Resp: 18  Temp: 97.7 F (36.5 C)  Weight: 149 lb (67.6 kg)  Height: 5\' 4"  (1.626 m)   Body mass index is 25.58 kg/m.  Physical Exam  GENERAL APPEARANCE: Well nourished. In no acute distress. Normal body habitus SKIN:  Right lower leg surgical wound has no erythema MOUTH and THROAT: Lips are without lesions. Oral mucosa is moist and without lesions. Tongue is normal in shape, size, and color and without lesions RESPIRATORY: Breathing is even & unlabored, BS CTAB CARDIAC: RRR, no murmur,no extra heart sounds, no edema. Right forearm AV fistula GI: Abdomen soft, normal BS, no  masses, no tenderness EXTREMITIES:  Able to move X 4 extremities NEUROLOGICAL: There is no tremor. Speech is clear. Alert and oriented X 3. PSYCHIATRIC:  Affect and behavior are appropriate  Labs reviewed: Recent Labs    06/15/19 1237 10/03/19 1154 02/13/20 1518 02/24/20 0957 03/22/20 0814 03/24/20 1051 03/27/20 0954  NA 138 140 140   < > 135 132*  130*  K 4.6 4.7 4.0   < > 4.1 4.7 5.1  CL 96* 93* 94*   < > 95* 93* 91*  CO2 30 35* 33*   < > 27 26 22   GLUCOSE 270* 88 118*   < > 139* 151* 97  BUN 61* 42* 54*   < > 41* 38* 43*  CREATININE 3.23* 3.91* 5.12*   < > 6.24* 6.18* 7.18*  CALCIUM 8.7 10.2 10.3   < > 9.3 10.1 10.7*  MG 2.5 2.8* 2.7*  --   --   --   --   PHOS  --   --   --    < > 4.2 4.9* 6.6*   < > = values in this interval not displayed.   Recent Labs    10/03/19 1154 02/13/20 1518 03/14/20 1421 03/17/20 0943 03/22/20 0814 03/24/20 1051 03/27/20 0954  AST 19 15 20   --   --   --   --   ALT 18 12 17   --   --   --   --   ALKPHOS  --   --  182*  --   --   --   --   BILITOT 0.5 0.7 0.8  --   --   --   --   PROT 6.3 6.5 6.5  --   --   --   --   ALBUMIN  --   --  3.6   < > 2.9* 2.8* 3.2*   < > = values in this interval not displayed.   Recent Labs    06/15/19 1237 10/03/19 1154 02/13/20 1518 02/24/20 0957 03/22/20 0814 03/24/20 1051 03/27/20 0954  WBC 8.8 7.5 7.3   < > 8.1 6.5 6.9  NEUTROABS 6,301 5,168 5,154  --   --   --   --   HGB 12.1 11.7 12.6   < > 8.3* 8.5* 10.0*  HCT 38.5 36.0 38.4   < > 25.0* 25.4* 30.9*  MCV 93.0 97.8 98.7   < > 99.6 100.4* 102.7*  PLT 247 224 171   < > 178 169 147*   < > = values in this interval not displayed.   Lab Results  Component Value Date   TSH 1.59 02/13/2020   Lab Results  Component Value Date   HGBA1C 5.4 02/13/2020   Lab Results  Component Value Date   CHOL 54 03/16/2020   HDL 24 (L) 03/16/2020   LDLCALC 21 03/16/2020   TRIG 47 03/16/2020   CHOLHDL 2.3 03/16/2020    Significant Diagnostic Results in  last 30 days:  VAS [name] ABI WITH/WO TBI  Result Date: 03/19/2020 LOWER EXTREMITY DOPPLER STUDY Indications: Ulceration, peripheral artery disease, and post-op. High Risk Factors: Hypertension, hyperlipidemia.  Vascular Interventions: 03-16-2020 RT femoral-anterior tibial bypass and                         external iliac endarterectomy.                         03-28-2019 LT CFA endarterectomy and fem-pop bypass. Limitations: Today's exam was limited due to involuntary patient movement and              bandages. Comparison Study: 02-20-2020 RT ABI 0.46 and LT ABI 1.73 ([place]) Performing Technologist: Darlin Coco RDMS  Examination Guidelines: A complete evaluation includes at minimum, Doppler waveform signals and systolic blood pressure reading at  the level of bilateral brachial, anterior tibial, and posterior tibial arteries, when vessel segments are accessible. Bilateral testing is considered an integral part of a complete examination. Photoelectric Plethysmograph (PPG) waveforms and toe systolic pressure readings are included as required and additional duplex testing as needed. Limited examinations for reoccurring indications may be performed as noted.  ABI Findings: +---------+------------------+-----+----------+--------------------------------+ Right    Rt Pressure (mmHg)IndexWaveform  Comment                          +---------+------------------+-----+----------+--------------------------------+ Brachial                        triphasic AVF                              +---------+------------------+-----+----------+--------------------------------+ PTA      84                0.68 monophasic                                 +---------+------------------+-----+----------+--------------------------------+ DP       134               1.08 monophasic                                 +---------+------------------+-----+----------+--------------------------------+ Great Toe                        Abnormal  Unable to obtain pressure due to                                           patient movement                 +---------+------------------+-----+----------+--------------------------------+ +---------+------------------+-----+---------+---------+ Left     Lt Pressure (mmHg)IndexWaveform Comment   +---------+------------------+-----+---------+---------+ Brachial 124                    triphasic          +---------+------------------+-----+---------+---------+ PTA      255               2.06 biphasic           +---------+------------------+-----+---------+---------+ DP       255               2.06 biphasic           +---------+------------------+-----+---------+---------+ Great Toe                                Bandaging +---------+------------------+-----+---------+---------+ +-------+-----------+-----------+------------+------------+ ABI/TBIToday's ABIToday's TBIPrevious ABIPrevious TBI +-------+-----------+-----------+------------+------------+ Right  1.08                  0.46        0.65         +-------+-----------+-----------+------------+------------+ Left   [place]                    1.73        1.65         +-------+-----------+-----------+------------+------------+ Right ABIs appear increased compared to  prior study on 02-17-2020. Left ABIs appear essentially unchanged compared to prior study on 02-17-2020.  Summary: Right: Right ankle-brachial index is within normal limits. ABI appears improved frojm study completed 02-17-2020. Left: Resting left ankle-brachial index indicates noncompressible left lower extremity arteries.  *See table(s) above for measurements and observations.  Electronically signed by Ruta Hinds MD on 03/19/2020 at 7:38:01 PM.    Final     Assessment/Plan  1. 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 PVD (peripheral vascular disease) (HCC) -  S/P right external iliac and common femoral endarterectomy and right femoral to anterior tibial bypass on 03/16/2020 -Continue PRN Norco for pain and PRN cyclobenzaprine for muscle spasm -Follow-up with vascular surgeon, Dr. Donnetta Hutching  3. Coronary artery disease involving coronary bypass graft of native heart without angina pectoris -No complaints of chest pain, continue isosorbide dinitrate  4. Essential hypertension -  BPs stable, continue hydralazine  5.  ESRD on hemodialysis -Continue hemodialysis 3 times/week -Continue cinacalcet and sevelamer      Family/ staff Communication: Discussed plan of care with resident and charge nurse.  Labs/tests ordered: None  Goals of care:   Short-term care   Durenda Age, DNP, MSN, FNP-BC [facility] [phone] (Monday-Friday 8:00 a.m. - 5:00 p.m.) [phone] (after hours)

## 2020-04-11 ENCOUNTER — Other Ambulatory Visit: Payer: Self-pay

## 2020-04-11 ENCOUNTER — Ambulatory Visit: Payer: Medicare Other | Admitting: Podiatry

## 2020-04-11 ENCOUNTER — Other Ambulatory Visit (HOSPITAL_COMMUNITY)
Admission: RE | Admit: 2020-04-11 | Discharge: 2020-04-11 | Disposition: A | Discharge status: Home / Self Care | Payer: PRIVATE HEALTH INSURANCE | Source: Other Acute Inpatient Hospital | Attending: Physician Assistant | Admitting: Physician Assistant

## 2020-04-11 ENCOUNTER — Other Ambulatory Visit: Payer: Self-pay | Admitting: *Deleted

## 2020-04-11 ENCOUNTER — Encounter (HOSPITAL_BASED_OUTPATIENT_CLINIC_OR_DEPARTMENT_OTHER): Payer: Medicare Other | Attending: Physician Assistant | Admitting: Physician Assistant

## 2020-04-11 DIAGNOSIS — L97419 Non-pressure chronic ulcer of right heel and midfoot with unspecified severity: Secondary | ICD-10-CM | POA: Diagnosis present

## 2020-04-11 DIAGNOSIS — L97512 Non-pressure chronic ulcer of 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d: Secondary | ICD-10-CM | POA: Insufficient documentation

## 2020-04-11 DIAGNOSIS — I890 Lymphedema, not elsewhere classified: Secondary | ICD-10-CM | POA: Diagnosis not present

## 2020-04-11 DIAGNOSIS — E1122 Type 2 diabetes mellitus with diabetic chronic kidney disease: Secondary | ICD-10-CM | POA: Diagnosis not present

## 2020-04-11 DIAGNOSIS — Z992 Dependence on renal dialysis: Secondary | ICD-10-CM | POA: Diagnosis not present

## 2020-04-11 DIAGNOSIS — N186 End stage renal disease: Secondary | ICD-10-CM | POA: Diagnosis not present

## 2020-04-11 DIAGNOSIS — E1151 Type 2 diabetes mellitus with diabetic peripheral angiopathy without gangrene: Secondary | ICD-10-CM | POA: Insufficient documentation

## 2020-04-11 DIAGNOSIS — L97516 Non-pressure chronic ulcer of other part of right foot with bone involvement without evidence of necrosis: Secondary | ICD-10-CM | POA: Diagnosis not present

## 2020-04-11 DIAGNOSIS — L97518 Non-pressure chronic ulcer of other part of right foot with other specified severity: Secondary | ICD-10-CM | POA: Diagnosis not present

## 2020-04-11 DIAGNOSIS — E11621 Type 2 diabetes mellitus with foot ulcer: Secondary | ICD-10-CM | POA: Diagnosis not present

## 2020-04-11 DIAGNOSIS — L97812 Non-pressure chronic ulcer of other part of right lower leg with fat layer exposed: Secondary | ICD-10-CM | POA: Diagnosis not present

## 2020-04-11 DIAGNOSIS — Z9641 Presence of insulin pump (external) (internal): Secondary | ICD-10-CM | POA: Diagnosis not present

## 2020-04-11 DIAGNOSIS — I120 Hypertensive chronic kidney disease with stage 5 chronic kidney disease or end stage renal disease: Secondary | ICD-10-CM | POA: Diagnosis not present

## 2020-04-11 DIAGNOSIS — I872 Venous insufficiency (chronic) (peripheral): Secondary | ICD-10-CM | POA: Diagnosis not present

## 2020-04-11 LAB — GLUCOSE, CAPILLARY: Glucose-Capillary: 88 mg/dL (ref 70–99)

## 2020-04-11 NOTE — Progress Notes (Signed)
[name], [name] ([id number]) Visit Report for 04/11/2020 Chief Complaint Document Details Patient Name: Date of Service: [name], [place] NNA J. 04/11/2020 7:30 A M Medical Record Number: [id number] Patient Account Number: [id number] Date of Birth/Sex: Treating RN: 1945-06-16 (75 y.o. [name] Primary Care Provider: [name] Other Clinician: Referring Provider: Treating Provider/Extender: Azucena Kuba in Treatment: 10 Information Obtained from: Patient Chief Complaint Right plantar foot ulcer and Right LE Ulcers Electronic Signature(s) Signed: 04/11/2020 8:29:51 AM By: Worthy Keeler PA-C Previous Signature: 04/11/2020 8:17:00 AM Version By: Worthy Keeler PA-C Entered By: Worthy Keeler on 04/11/2020 08:29:50 -------------------------------------------------------------------------------- Debridement Details Patient Name: Date of Service: Pamela Pilon, DO NNA J. 04/11/2020 7:30 A M Medical Record Number: [id number] Patient Account Number: [id number] Date of Birth/Sex: Treating RN: January 23, 1946 (75 y.o. [name] Primary Care Provider: [name] Other Clinician: Referring Provider: Treating Provider/Extender: Azucena Kuba in Treatment: 10 Debridement Performed for Assessment: Wound #17 Right,Plantar Foot Performed By: Physician Worthy Keeler, PA Debridement Type: Debridement Severity of Tissue Pre Debridement: Fat layer exposed Level of Consciousness (Pre-procedure): Awake and Alert Pre-procedure Verification/Time Out Yes - 08:34 Taken: Start Time: 08:34 T Area Debrided (L x W): otal 0.5 (cm) x 0.5 (cm) = 0.25 (cm) Tissue and other material debrided: Non-Viable, Callus Level: Non-Viable Tissue Debridement Description: Selective/Open Wound Instrument: Curette Bleeding: None End Time: 08:37 Procedural Pain: 0 Post Procedural Pain: 0 Response to Treatment: Procedure was tolerated well Level of  Consciousness (Post- Awake and Alert procedure): Post Debridement Measurements of Total Wound Length: (cm) 0.2 Width: (cm) 0.2 Depth: (cm) 0.1 Volume: (cm) 0.003 Character of Wound/Ulcer Post Debridement: Improved Severity of Tissue Post Debridement: Fat layer exposed Post Procedure Diagnosis Same as Pre-procedure Electronic Signature(s) Signed: 04/11/2020 4:44:38 PM By: Worthy Keeler PA-C Signed: 04/11/2020 5:18:45 PM By: [name] RN, BSN Entered By: [name] on 04/11/2020 08:37:42 -------------------------------------------------------------------------------- Debridement Details Patient Name: Date of Service: Pamela Pilon, DO NNA J. 04/11/2020 7:30 A M Medical Record Number: [id number] Patient Account Number: [id number] Date of Birth/Sex: Treating RN: November 07, 1945 (75 y.o. [name], Briant Cedar Primary Care Provider: [name] Other Clinician: Referring Provider: Treating Provider/Extender: Azucena Kuba in Treatment: 10 Debridement Performed for Assessment: Wound #18 Right,Proximal,Lateral Foot Performed By: Physician Worthy Keeler, PA Debridement Type: Debridement Severity of Tissue Pre Debridement: Bone involvement without necrosis Level of Consciousness (Pre-procedure): Awake and Alert Pre-procedure Verification/Time Out Yes - 08:34 Taken: Start Time: 08:34 T Area Debrided (L x W): otal 3 (cm) x 1.8 (cm) = 5.4 (cm) Tissue and other material debrided: Viable, Non-Viable, Bone, Slough, Subcutaneous, Tendon, Slough Level: Skin/Subcutaneous Tissue/Muscle/Bone Debridement Description: Excisional Instrument: Curette Specimen: Swab, Tissue Culture Number of Specimens T aken: 2 Bleeding: None End Time: 08:37 Procedural Pain: 0 Post Procedural Pain: 0 Response to Treatment: Procedure was tolerated well Level of Consciousness (Post- Awake and Alert procedure): Post Debridement Measurements of Total Wound Length: (cm) 3 Width:  (cm) 1.8 Depth: (cm) 0.5 Volume: (cm) 2.121 Character of Wound/Ulcer Post Debridement: Improved Severity of Tissue Post Debridement: Bone involvement without necrosis Post Procedure Diagnosis Same as Pre-procedure Electronic Signature(s) Signed: 04/11/2020 4:44:38 PM By: Worthy Keeler PA-C Signed: 04/11/2020 5:18:45 PM By: [name] RN, BSN Entered By: [name] on 04/11/2020 08:47:38 -------------------------------------------------------------------------------- HPI Details Patient Name: Date of Service: Pamela Pilon, DO NNA J. 04/11/2020 7:30 A M Medical Record Number: [id number] Patient Account  Number: [id number] Date of Birth/Sex: Treating RN: [birth year] (75 y.o. [name] Primary Care Provider: [name] Other Clinician: Referring Provider: Treating Provider/Extender: Azucena Kuba in Treatment: 10 History of Present Illness HPI Description: 02/23/2019 on evaluation today patient presents for initial evaluation in our clinic concerning issues that she has been having with her bilateral lower extremities as well as her plantar foot on the right. This has been quite some time in regard to the foot ulcer even back as far as the beginning of this year that is 2020. She has had the wounds on her legs a much shorter time but that still has been roughly 2-3 months. Nonetheless she is really not shown signs of a lot of improvement. She tells me that she does have a past medical history of diabetes, venous insufficiency, lymphedema which is mild but nonetheless present, peripheral vascular disease and she has previously been recommended to have a femoral-popliteal bypass but put that off at that point they are basically just monitoring at this time. She has end-stage renal disease with dependence on renal dialysis, hypertension, and obviously wounds of the bilateral lower extremities at this point. She tells me that she is having some  discomfort but fortunately nothing too significant at this point which is good news. No fevers, chills, nausea, vomiting, or diarrhea. She is mainly been leaving these areas open to air as much as possible at this point at 1 point she was trying to keep them covered more but that really did not help either. Fortunately there is no signs of active systemic nor local infection at this time. 03/02/2019 on evaluation today patient appears to be doing well with regard to her wounds. The leg ulcers which were very dry are much more moist at this point and seem to be doing great at this time. Overall I am very pleased with how things seem to be progressing. With regard to her foot ulcer it is showing some signs of slight improvement in my opinion overall there does not appear to be anything worse at least which is also good news. No fevers, chills, nausea, vomiting, or diarrhea. 03/09/2019 on evaluation today patient appears to be doing better with regard to her lower extremity ulcers a lot of the necrotic tissue is loosening up which is good news. In regard to her right plantar foot ulcer this appears to be roughly the same based on what I am seeing currently although there is less callus buildup. Unfortunately she does have an open wound area on the left lateral foot region which unfortunately probes down to bone once this was thoroughly examined. This is something she did not even know was opened she had had a Band-Aid over it just for protection last couple times I saw her. 03/16/19 evaluation today patient appears to be doing Someone poorly in regard to her left lower extremity. This seems to be likely infected based on what Im seeing at this point. Theres no signs of active infection at this time systemically but nonetheless theres a lot more erythema of the foot in general unfortunately. Her x-rays were reviewed and did not show any evidence of infection of the right foot. On the left there was a A little  bit more going on but no obvious evidence of osteomyelitis though it was mentioned that if that was still a concern that number I would be the best way to further evaluate this. I think they were going to need  to proceed with MRI to be honest based on what Im seeing today. 03/23/2019 on evaluation today patient actually appears to be doing worse compared to even last week's visit with regard to her wounds especially the left foot but even the right lower extremity. Both are measuring deeper as far as the overall depth of the wound on the left foot this also seems to have spread as far as the open wound location. It also does appear to me that the cellulitis has spread further up her leg which also has me very concerned at this point. There fortunately is no signs of active infection at this point systemically such as evidence of sepsis but nonetheless I am still concerned about the fact that again the patient does not seem to be turning around. We did have her on IV vancomycin through dialysis which they graciously help this with as well. With that being said it appears based on her culture results that I did review today that cefepime or something of the like would likely be a better option the vancomycin is not likely to help with the Serratia species that was identified. 03/28/19 Fem-Pop 04/06/19 Amp toe 61month follow-up with vascular in april 05/18/2019 on evaluation today patient presents for reevaluation here in the clinic after having been sent to the ER by myself December 2020 due to overall worsening of her condition. She did have a femoral-popliteal bypass performed on 03/28/2019. 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 Subsequently she does have a 65-month follow-up with vascular in April but seems to be doing well from a vascular standpoint at this time. Overall the main issue for which she comes in today she does have a  area on the plantar aspect of her right foot where she does have a callus there may or may not be a wound under this region. Subsequently she does have several small ulcerations on the bilateral lower extremities that are more venous stasis in nature. Continue to these do need to be addressed today as well. Fortunately there is no signs of active infection at this time. 05/25/2019 upon evaluation today patient appears to be doing well in regard to her lower extremities. Fortunately there is no signs of active infection at this time. No fever chills noted. She has been tolerating the dressing changes without complication. Fortunately she is doing much better in regard to her plantar foot in fact this appears to be completely healed which is excellent news. 06/01/2019 on evaluation today patient actually appears to be doing very well in regard to her wounds at this point. Fortunately there is no signs of active infection. No fevers, chills, nausea, vomiting, or diarrhea. 06/08/2019 upon evaluation today patient actually appears to be doing great in regard to her right lower extremity there are no open wounds remaining and she has done extremely well. Unfortunately she has several blistered areas that opened up on the left lower extremity in the interim between last week and this week. She has not been wearing any compression she states that I never told her anything about wearing compression. With that being said this is something that we have discussed in the past and it is something that we wanted her to do and again I did remind her of that today as well. 06/15/2019 upon evaluation today patient appears to be doing well with regard to her wound she does have some dressing material stuck to the surface  of her wounds apparently they dried out and healed fairly quickly causing this to stick. Fortunately we should be able to clean this off but again that is one of the main issues that I see at this point.  Fortunately there is no signs of active infection at this time. No fevers, chills, nausea, vomiting, or diarrhea. 06/22/2019 upon evaluation today patient appears to be doing very well in regard to her wounds. She has been tolerating the dressing changes without complication. Fortunately there is no signs of active infection at this time. No fevers, chills, nausea, vomiting, or diarrhea. In fact the right leg appears to be completely healed she does have 1 pair of the compression socks with her 15-20 mmHg. Subsequently her left leg is doing much better although not completely healed she still has 1 area open at this point. 06/29/2019 on evaluation today patient appears to be doing excellent in regard to her left lower extremity. There is no signs of active infection at this time the wounds appear to be completely healed and overall she is doing excellent. Readmission: 02/01/2020 upon evaluation today patient appears to be doing somewhat poorly in regard to her right foot ulcer locations. She has 3 spots that are problematic at this time. Unfortunately she has been seeing podiatry but they have not been able to get them to improve. She does have poor blood flow here and I do believe that she really needs to go back and see vascular for further evaluation and recommendations in this regard. I also think an x-ray would be ideal considering the fact that that according to the patient has not been done as of yet. I also believe Santyl will likely be beneficial for her. She has had a bypass surgery on her leg on the left they were holding off on the right unless it became an absolute necessity due to the fact that unfortunately she also has had a heart bypass which has been somewhat problematic for further intervention in the right lower extremity. 02/08/2020 upon evaluation today patient appears to be doing well with regard to her foot all things considered at each wound location. With that being said the  x- ray was negative for any signs of obvious osteomyelitis. Nonetheless she is still awaiting the ultrasound and visit with Dr. Donnetta Hutching due to critical limb ischemia. Hopefully that will be coming up in the next week which the patient tells me is so. 02/22/2020 on evaluation today patient appears to be doing decently well just roughly the same in regard to her foot ulcers. She has seen Dr. Donnetta Hutching he has recommended an arteriogram that supposed to be scheduled for her but she tells me she has not heard anything from vascular about setting this up as of yet. Nonetheless I think the sooner that can be done the better as far as figuring out exactly what were going is concerned. Fortunately there is no signs of active infection at this time which is great news. No fevers, chills, nausea, vomiting, or diarrhea. Patient's left leg showed to be noncompressible on her ABI testing her right leg showed low with an ABI of 0.65 and a TBI of 0.39. 04/11/2020 upon evaluation today patient appears to be doing well all things considered in regard to her blood flow. Since have last seen her she is actually undergone a couple of surgeries. This was to improve the blood flow in the bilateral lower extremities. Her first surgery was actually during her hospitalization between 03/16/2020 and 03/27/2020.  This was following her right femoral anterior tibial bypass and external iliac endarterectomy. This procedure was performed on 03/16/2020. Subsequently she ended up also having a CFA endarterectomy and femoral-popliteal bypass on 03/28/2019 according to records. All things considered the testing that was performed on 03/19/2020 which was an arterial study shows that she did have excellent blood flow with an ABI of 1.08 on the right at that time. Obviously that is improvement compared to where things were previous. Nonetheless overall today I feel like the patient is going require some sharp debridement in regard to her right  lateral foot there is definitely necrotic tendon exposed possibly necrotic bone as well based on what I am palpating. Electronic Signature(s) Signed: 04/11/2020 10:13:20 AM By: Worthy Keeler PA-C Entered By: Worthy Keeler on 04/11/2020 10:13:20 -------------------------------------------------------------------------------- Physical Exam Details Patient Name: Date of Service: Pamela Pilon, DO NNA J. 04/11/2020 7:30 A M Medical Record Number: [id number] Patient Account Number: [id number] Date of Birth/Sex: Treating RN: 1945-07-03 (75 y.o. [name] Primary Care Provider: [name] Other Clinician: Referring Provider: Treating Provider/Extender: Azucena Kuba in Treatment: 51 Constitutional Well-nourished and well-hydrated in no acute distress. Respiratory normal breathing without difficulty. Psychiatric this patient is able to make decisions and demonstrates good insight into disease process. Alert and Oriented x 3. pleasant and cooperative. Notes Currently the patient did undergo a fairly extensive debridement today of her lateral foot ulcer. I removed necrotic tendon, necrotic bone, and I did send a sampling of the bone for both culture and pathology. Patient did not really have any pain although she did tell me that it felt "kind of weird" during the time that I was performing the debridement. Post debridement the wound bed does appear to be doing better I did have to use 1 stick of silver nitrate in order to chemically cauterize and stop the bleeding. Nonetheless I was able to achieve hemostasis. I did also debride the plantar aspect of the foot this was a much smaller area and did not appear to show any signs of significant opening though there was a small area underneath the callus noted upon debridement Electronic Signature(s) Signed: 04/11/2020 10:14:37 AM By: Worthy Keeler PA-C Entered By: Worthy Keeler on 04/11/2020  10:14:36 -------------------------------------------------------------------------------- Physician Orders Details Patient Name: Date of Service: Pamela Pilon, DO NNA J. 04/11/2020 7:30 A M Medical Record Number: [id number] Patient Account Number: [id number] Date of Birth/Sex: Treating RN: 1945/07/21 (75 y.o. [name] Primary Care Provider: [name] Other Clinician: Referring Provider: Treating Provider/Extender: Azucena Kuba in Treatment: 10 Verbal / Phone Orders: No Diagnosis Coding ICD-10 Coding Code Description I87.2 Venous insufficiency (chronic) (peripheral) I89.0 Lymphedema, not elsewhere classified E11.621 Type 2 diabetes mellitus with foot ulcer I73.89 Other specified peripheral vascular diseases L97.512 Non-pressure chronic ulcer of 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d L97.518 Non-pressure chronic ulcer of other part of right foot with other specified severity L97.812 Non-pressure chronic ulcer of other part of right lower leg with fat layer exposed N18.6 End stage renal disease Z99.2 Dependence on renal dialysis I10 Essential (primary) hypertension Follow-up Appointments Return Appointment in 1 week. Edema Control - Lymphedema / SCD / Other Elevate legs to the level of the heart or above for 30 minutes daily and/or when sitting, a frequency of: - throughout the day Avoid standing for long periods of time. Moisturize legs daily. Wound Treatment Wound #17 - Foot Wound Laterality:  Plantar, Right Cleanser: Normal Saline 1 x Per Day Discharge Instructions: Cleanse the wound with Normal Saline prior to applying a clean dressing using gauze sponges, not tissue or cotton balls. Peri-Wound Care: Sween Lotion (Moisturizing lotion) 1 x Per Day Discharge Instructions: Apply moisturizing lotion as directed Prim Dressing: Promogran Prisma Matrix, 4.34 (sq in) (silver collagen) 1 x Per Day ary Discharge Instructions: Moisten  collagen with saline or hydrogel Secondary Dressing: Woven Gauze Sponge, Non-Sterile 4x4 in 1 x Per Day Discharge Instructions: Apply over primary dressing as directed. Secured With: Elastic Bandage 4 inch (ACE bandage) 1 x Per Day Discharge Instructions: Secure with ACE bandage as directed. Secured With: [employer], 4.5x3.1 (in/yd) 1 x Per Day Discharge Instructions: Secure with Kerlix as directed. Wound #18 - Foot Wound Laterality: Right, Lateral, Proximal Cleanser: Normal Saline 1 x Per Day Discharge Instructions: Cleanse the wound with Normal Saline prior to applying a clean dressing using gauze sponges, not tissue or cotton balls. Peri-Wound Care: Sween Lotion (Moisturizing lotion) 1 x Per Day Discharge Instructions: Apply moisturizing lotion as directed Prim Dressing: Santyl Ointment 1 x Per Day ary Discharge Instructions: Apply nickel thick amount to wound bed as instructed Secondary Dressing: Woven Gauze Sponge, Non-Sterile 4x4 in 1 x Per Day Discharge Instructions: Apply over primary dressing as directed. Secured With: Elastic Bandage 4 inch (ACE bandage) 1 x Per Day Discharge Instructions: Secure with ACE bandage as directed. Secured With: [employer], 4.5x3.1 (in/yd) 1 x Per Day Discharge Instructions: Secure with Kerlix as directed. Wound #19 - Lower Leg Wound Laterality: Right, Anterior Cleanser: Normal Saline 1 x Per Day Discharge Instructions: Cleanse the wound with Normal Saline prior to applying a clean dressing using gauze sponges, not tissue or cotton balls. Peri-Wound Care: Sween Lotion (Moisturizing lotion) 1 x Per Day Discharge Instructions: Apply moisturizing lotion as directed Prim Dressing: Promogran Prisma Matrix, 4.34 (sq in) (silver collagen) 1 x Per Day ary Discharge Instructions: Moisten collagen with saline or hydrogel Secondary Dressing: Woven Gauze Sponge, Non-Sterile 4x4 in 1 x Per Day Discharge Instructions: Apply over primary dressing  as directed. Secured With: Elastic Bandage 4 inch (ACE bandage) 1 x Per Day Discharge Instructions: Secure with ACE bandage as directed. Secured With: [employer], 4.5x3.1 (in/yd) 1 x Per Day Discharge Instructions: Secure with Kerlix as directed. Wound #20 - Lower Leg Wound Laterality: Right, Medial Cleanser: Normal Saline 1 x Per Day Discharge Instructions: Cleanse the wound with Normal Saline prior to applying a clean dressing using gauze sponges, not tissue or cotton balls. Peri-Wound Care: Sween Lotion (Moisturizing lotion) 1 x Per Day Discharge Instructions: Apply moisturizing lotion as directed Prim Dressing: Promogran Prisma Matrix, 4.34 (sq in) (silver collagen) 1 x Per Day ary Discharge Instructions: Moisten collagen with saline or hydrogel Secondary Dressing: Woven Gauze Sponge, Non-Sterile 4x4 in 1 x Per Day Discharge Instructions: Apply over primary dressing as directed. Secured With: Elastic Bandage 4 inch (ACE bandage) 1 x Per Day Discharge Instructions: Secure with ACE bandage as directed. Secured With: [employer], 4.5x3.1 (in/yd) 1 x Per Day Discharge Instructions: Secure with Kerlix as directed. Wound #21 - Calcaneus Wound Laterality: Right Cleanser: Normal Saline 1 x Per Day Discharge Instructions: Cleanse the wound with Normal Saline prior to applying a clean dressing using gauze sponges, not tissue or cotton balls. Prim Dressing: Santyl Ointment 1 x Per Day ary Discharge Instructions: Apply nickel thick amount to wound bed as instructed Secondary Dressing: Woven Gauze Sponge, Non-Sterile 4x4 in  1 x Per Day Discharge Instructions: Apply over primary dressing as directed. Secured With: Elastic Bandage 4 inch (ACE bandage) 1 x Per Day Discharge Instructions: Secure with ACE bandage as directed. Secured With: [employer], 4.5x3.1 (in/yd) 1 x Per Day Discharge Instructions: Secure with Kerlix as directed. Laboratory naerobe culture (MICRO) -  Bone - right proximal lateral foot - (ICD10 E11.621 - Type 2 diabetes Bacteria identified in Unspecified specimen by A mellitus with foot ulcer) LOINC Code: 409-8 Convenience Name: Anerobic culture Bacteria identified in Tissue by Biopsy culture (MICRO) - Bone - right proximal lateral foot - (ICD10 E11.621 - Type 2 diabetes mellitus with foot ulcer) LOINC Code: 11914-7 Convenience Name: Biopsy specimen culture Electronic Signature(s) Signed: 04/11/2020 4:44:38 PM By: Worthy Keeler PA-C Signed: 04/11/2020 5:18:45 PM By: [name] RN, BSN Entered By: [name] on 04/11/2020 08:59:40 -------------------------------------------------------------------------------- Problem List Details Patient Name: Date of Service: Pamela Pilon, DO NNA J. 04/11/2020 7:30 A M Medical Record Number: [id number] Patient Account Number: [id number] Date of Birth/Sex: Treating RN: 12/07/1945 (75 y.o. [name], Briant Cedar Primary Care Provider: [name] Other Clinician: Referring Provider: Treating Provider/Extender: Azucena Kuba in Treatment: 10 Active Problems ICD-10 Encounter Code Description Active Date MDM Diagnosis I87.2 Venous insufficiency (chronic) (peripheral) 02/01/2020 No Yes I89.0 Lymphedema, not elsewhere classified 02/01/2020 No Yes E11.621 Type 2 diabetes mellitus with foot ulcer 02/01/2020 No Yes I73.89 Other specified peripheral vascular diseases 02/01/2020 No Yes L97.512 Non-pressure chronic ulcer of 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d 02/01/2020 No Yes L97.518 Non-pressure chronic ulcer of other part of right foot with other specified 02/01/2020 No Yes severity L97.812 Non-pressure chronic ulcer of other part of right lower leg with fat layer 04/11/2020 No Yes exposed N18.6 End stage renal disease 02/01/2020 No Yes Z99.2 Dependence on renal dialysis 02/01/2020 No Yes I10 Essential (primary) hypertension 02/01/2020 No Yes Inactive  Problems Resolved Problems Electronic Signature(s) Signed: 04/11/2020 8:29:34 AM By: Worthy Keeler PA-C Previous Signature: 04/11/2020 8:16:55 AM Version By: Worthy Keeler PA-C Entered By: Worthy Keeler on 04/11/2020 08:29:34 -------------------------------------------------------------------------------- Progress Note Details Patient Name: Date of Service: Pamela Pilon, DO NNA J. 04/11/2020 7:30 A M Medical Record Number: [id number] Patient Account Number: [id number] Date of Birth/Sex: Treating RN: 1946-01-01 (75 y.o. [name] Primary Care Provider: [name] Other Clinician: Referring Provider: Treating Provider/Extender: Azucena Kuba in Treatment: 10 Subjective Chief Complaint Information obtained from Patient Right plantar foot ulcer and Right LE Ulcers History of Present Illness (HPI) 02/23/2019 on evaluation today patient presents for initial evaluation in our clinic concerning issues that she has been having with her bilateral lower extremities as well as her plantar foot on the right. This has been quite some time in regard to the foot ulcer even back as far as the beginning of this year that is 2020. She has had the wounds on her legs a much shorter time but that still has been roughly 2-3 months. Nonetheless she is really not shown signs of a lot of improvement. She tells me that she does have a past medical history of diabetes, venous insufficiency, lymphedema which is mild but nonetheless present, peripheral vascular disease and she has previously been recommended to have a femoral-popliteal bypass but put that off at that point they are basically just monitoring at this time. She has end-stage renal disease with dependence on renal dialysis, hypertension, and obviously wounds of the bilateral  lower extremities at this point. She tells me that she is having some discomfort but fortunately nothing too significant at this point which  is good news. No fevers, chills, nausea, vomiting, or diarrhea. She is mainly been leaving these areas open to air as much as possible at this point at 1 point she was trying to keep them covered more but that really did not help either. Fortunately there is no signs of active systemic nor local infection at this time. 03/02/2019 on evaluation today patient appears to be doing well with regard to her wounds. The leg ulcers which were very dry are much more moist at this point and seem to be doing great at this time. Overall I am very pleased with how things seem to be progressing. With regard to her foot ulcer it is showing some signs of slight improvement in my opinion overall there does not appear to be anything worse at least which is also good news. No fevers, chills, nausea, vomiting, or diarrhea. 03/09/2019 on evaluation today patient appears to be doing better with regard to her lower extremity ulcers a lot of the necrotic tissue is loosening up which is good news. In regard to her right plantar foot ulcer this appears to be roughly the same based on what I am seeing currently although there is less callus buildup. Unfortunately she does have an open wound area on the left lateral foot region which unfortunately probes down to bone once this was thoroughly examined. This is something she did not even know was opened she had had a Band-Aid over it just for protection last couple times I saw her. 03/16/19 evaluation today patient appears to be doing Someone poorly in regard to her left lower extremity. This seems to be likely infected based on what Ioom seeing at this point. Thereoos no signs of active infection at this time systemically but nonetheless thereoos a lot more erythema of the foot in general unfortunately. Her x-rays were reviewed and did not show any evidence of infection of the right foot. On the left there was a A little bit more going on but no obvious evidence of osteomyelitis  though it was mentioned that if that was still a concern that number I would be the best way to further evaluate this. I think they were going to need to proceed with MRI to be honest based on what Ioom seeing today. 03/23/2019 on evaluation today patient actually appears to be doing worse compared to even last week's visit with regard to her wounds especially the left foot but even the right lower extremity. Both are measuring deeper as far as the overall depth of the wound on the left foot this also seems to have spread as far as the open wound location. It also does appear to me that the cellulitis has spread further up her leg which also has me very concerned at this point. There fortunately is no signs of active infection at this point systemically such as evidence of sepsis but nonetheless I am still concerned about the fact that again the patient does not seem to be turning around. We did have her on IV vancomycin through dialysis which they graciously help this with as well. With that being said it appears based on her culture results that I did review today that cefepime or something of the like would likely be a better option the vancomycin is not likely to help with the Serratia species that was identified. 03/28/19 Fem-Pop 04/06/19  Amp toe 27month follow-up with vascular in april 05/18/2019 on evaluation today patient presents for reevaluation here in the clinic after having been sent to the ER by myself December 2020 due to overall worsening of her condition. She did have a femoral-popliteal bypass performed on 03/28/2019. 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 Subsequently she does have a 43-month follow-up with vascular in April but seems to be doing well from a vascular standpoint at this time. Overall the main issue for which she comes in today she does have a area on the plantar aspect of her right foot where she does  have a callus there may or may not be a wound under this region. Subsequently she does have several small ulcerations on the bilateral lower extremities that are more venous stasis in nature. Continue to these do need to be addressed today as well. Fortunately there is no signs of active infection at this time. 05/25/2019 upon evaluation today patient appears to be doing well in regard to her lower extremities. Fortunately there is no signs of active infection at this time. No fever chills noted. She has been tolerating the dressing changes without complication. Fortunately she is doing much better in regard to her plantar foot in fact this appears to be completely healed which is excellent news. 06/01/2019 on evaluation today patient actually appears to be doing very well in regard to her wounds at this point. Fortunately there is no signs of active infection. No fevers, chills, nausea, vomiting, or diarrhea. 06/08/2019 upon evaluation today patient actually appears to be doing great in regard to her right lower extremity there are no open wounds remaining and she has done extremely well. Unfortunately she has several blistered areas that opened up on the left lower extremity in the interim between last week and this week. She has not been wearing any compression she states that I never told her anything about wearing compression. With that being said this is something that we have discussed in the past and it is something that we wanted her to do and again I did remind her of that today as well. 06/15/2019 upon evaluation today patient appears to be doing well with regard to her wound she does have some dressing material stuck to the surface of her wounds apparently they dried out and healed fairly quickly causing this to stick. Fortunately we should be able to clean this off but again that is one of the main issues that I see at this point. Fortunately there is no signs of active infection at this time.  No fevers, chills, nausea, vomiting, or diarrhea. 06/22/2019 upon evaluation today patient appears to be doing very well in regard to her wounds. She has been tolerating the dressing changes without complication. Fortunately there is no signs of active infection at this time. No fevers, chills, nausea, vomiting, or diarrhea. In fact the right leg appears to be completely healed she does have 1 pair of the compression socks with her 15-20 mmHg. Subsequently her left leg is doing much better although not completely healed she still has 1 area open at this point. 06/29/2019 on evaluation today patient appears to be doing excellent in regard to her left lower extremity. There is no signs of active infection at this time the wounds appear to be completely healed and overall she is doing excellent. Readmission: 02/01/2020 upon evaluation today patient appears to be doing somewhat poorly in regard  to her right foot ulcer locations. She has 3 spots that are problematic at this time. Unfortunately she has been seeing podiatry but they have not been able to get them to improve. She does have poor blood flow here and I do believe that she really needs to go back and see vascular for further evaluation and recommendations in this regard. I also think an x-ray would be ideal considering the fact that that according to the patient has not been done as of yet. I also believe Santyl will likely be beneficial for her. She has had a bypass surgery on her leg on the left they were holding off on the right unless it became an absolute necessity due to the fact that unfortunately she also has had a heart bypass which has been somewhat problematic for further intervention in the right lower extremity. 02/08/2020 upon evaluation today patient appears to be doing well with regard to her foot all things considered at each wound location. With that being said the x- ray was negative for any signs of obvious osteomyelitis.  Nonetheless she is still awaiting the ultrasound and visit with Dr. Donnetta Hutching due to critical limb ischemia. Hopefully that will be coming up in the next week which the patient tells me is so. 02/22/2020 on evaluation today patient appears to be doing decently well just roughly the same in regard to her foot ulcers. She has seen Dr. Donnetta Hutching he has recommended an arteriogram that supposed to be scheduled for her but she tells me she has not heard anything from vascular about setting this up as of yet. Nonetheless I think the sooner that can be done the better as far as figuring out exactly what were going is concerned. Fortunately there is no signs of active infection at this time which is great news. No fevers, chills, nausea, vomiting, or diarrhea. Patient's left leg showed to be noncompressible on her ABI testing her right leg showed low with an ABI of 0.65 and a TBI of 0.39. 04/11/2020 upon evaluation today patient appears to be doing well all things considered in regard to her blood flow. Since have last seen her she is actually undergone a couple of surgeries. This was to improve the blood flow in the bilateral lower extremities. Her first surgery was actually during her hospitalization between 03/16/2020 and 03/27/2020. This was following her right femoral anterior tibial bypass and external iliac endarterectomy. This procedure was performed on 03/16/2020. Subsequently she ended up also having a CFA endarterectomy and femoral-popliteal bypass on 03/28/2019 according to records. All things considered the testing that was performed on 03/19/2020 which was an arterial study shows that she did have excellent blood flow with an ABI of 1.08 on the right at that time. Obviously that is improvement compared to where things were previous. Nonetheless overall today I feel like the patient is going require some sharp debridement in regard to her right lateral foot there is definitely necrotic tendon exposed possibly  necrotic bone as well based on what I am palpating. Patient History Information obtained from Patient. Family History Cancer - Father,Siblings, Diabetes - Maternal Grandparents,Paternal Grandparents,Mother,Father,Siblings, Heart Disease - Maternal Grandparents,Paternal Grandparents,Mother,Father,Siblings, Hypertension - Maternal Grandparents,Paternal Grandparents, Kidney Disease - Siblings, No family history of Hereditary Spherocytosis, Lung Disease, Seizures, Stroke, Thyroid Problems, Tuberculosis. Social History Never smoker, Marital Status - Married, Alcohol Use - Never, Drug Use - No History, Caffeine Use - Rarely. Medical History Eyes Patient has history of Cataracts - removed Hematologic/Lymphatic Patient has history of  Anemia Respiratory Patient has history of Asthma Cardiovascular Patient has history of Congestive Heart Failure, Coronary Artery Disease, Hypertension, Peripheral Arterial Disease, Peripheral Venous Disease Endocrine Patient has history of Type II Diabetes Genitourinary Patient has history of End Stage Renal Disease - Hemodialysis 3x/week Integumentary (Skin) Denies history of History of Burn Musculoskeletal Patient has history of Osteoarthritis, Osteomyelitis - Left 5th toe Neurologic Patient has history of Neuropathy Oncologic Patient has history of Received Radiation Hospitalization/Surgery History - Right Femoral Bypass 03/16/2020. Medical A Surgical History Notes nd Cardiovascular Hyperlipidemia, Cardiomyopathy, s/p fem-pop in 12/20 Musculoskeletal 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 Oncologic Breast cancer 2014 with lumpectomy Objective Constitutional Well-nourished and well-hydrated in no acute distress. Vitals Time Taken: 7:35 AM, Height: 64 in, Weight: 150 lbs, BMI: 25.7, Temperature: 98.4 F, Pulse: 83 bpm, Respiratory Rate: 16 breaths/min, Blood Pressure: 145/74 mmHg. Respiratory normal breathing without  difficulty. Psychiatric this patient is able to make decisions and demonstrates good insight into disease process. Alert and Oriented x 3. pleasant and cooperative. General Notes: Currently the patient did undergo a fairly extensive debridement today of her lateral foot ulcer. I removed necrotic tendon, necrotic bone, and I did send a sampling of the bone for both culture and pathology. Patient did not really have any pain although she did tell me that it felt "kind of weird" during the time that I was performing the debridement. Post debridement the wound bed does appear to be doing better I did have to use 1 stick of silver nitrate in order to chemically cauterize and stop the bleeding. Nonetheless I was able to achieve hemostasis. I did also debride the plantar aspect of the foot this was a much smaller area and did not appear to show any signs of significant opening though there was a small area underneath the callus noted upon debridement Integumentary (Hair, Skin) Wound #16 status is Open. Original cause of wound was Gradually Appeared. The wound is located on the Right,Distal,Lateral Foot. The wound measures 0cm length x 0cm width x 0cm depth; 0cm^2 area and 0cm^3 volume. There is no tunneling or undermining noted. There is a none present amount of drainage noted. The wound margin is distinct with the outline attached to the wound base. There is no granulation within the wound bed. There is no necrotic tissue within the wound bed. Wound #17 status is Open. Original cause of wound was Gradually Appeared. The wound is located on the [place]. The wound measures 0.2cm length x 0.2cm width x 0.1cm depth; 0.031cm^2 area and 0.003cm^3 volume. There is Fat Layer (Subcutaneous Tissue) exposed. There is no tunneling or undermining noted. There is a small amount of serosanguineous drainage noted. The wound margin is thickened. There is large (67-100%) pink granulation within the wound bed.  There is no necrotic tissue within the wound bed. Wound #18 status is Open. Original cause of wound was Gradually Appeared. The wound is located on the Right,Proximal,Lateral Foot. The wound measures 3cm length x 1.8cm width x 0.5cm depth; 4.241cm^2 area and 2.121cm^3 volume. There is bone, tendon, and Fat Layer (Subcutaneous Tissue) exposed. There is no tunneling or undermining noted. There is a medium amount of serosanguineous drainage noted. The wound margin is epibole. There is small (1-33%) pink granulation within the wound bed. There is a large (67-100%) amount of necrotic tissue within the wound bed including Adherent Slough. Wound #19 status is Open. Original cause of wound was Blister. The wound is located on the Right,Anterior Lower Leg. The wound  measures 6.7cm length x 1.1cm width x 0.1cm depth; 5.788cm^2 area and 0.579cm^3 volume. There is Fat Layer (Subcutaneous Tissue) exposed. There is no tunneling or undermining noted. There is a medium amount of serosanguineous drainage noted. The wound margin is distinct with the outline attached to the wound base. There is medium (34-66%) red granulation within the wound bed. There is a medium (34-66%) amount of necrotic tissue within the wound bed including Adherent Slough. Wound #20 status is Open. Original cause of wound was Blister. The wound is located on the Right,Medial Lower Leg. The wound measures 0.8cm length x 1.1cm width x 0.1cm depth; 0.691cm^2 area and 0.069cm^3 volume. There is Fat Layer (Subcutaneous Tissue) exposed. There is no tunneling or undermining noted. There is a medium amount of serosanguineous drainage noted. The wound margin is distinct with the outline attached to the wound base. There is large (67-100%) red, pink granulation within the wound bed. There is no necrotic tissue within the wound bed. Wound #21 status is Open. Original cause of wound was Pressure Injury. The wound is located on the Right Calcaneus. The wound  measures 1cm length x 1.5cm width x 0.1cm depth; 1.178cm^2 area and 0.118cm^3 volume. There is Fat Layer (Subcutaneous Tissue) exposed. There is no tunneling or undermining noted. There is a medium amount of serous drainage noted. The wound margin is flat and intact. There is small (1-33%) pink granulation within the wound bed. There is a large (67-100%) amount of necrotic tissue within the wound bed including Adherent Slough. Assessment Active Problems ICD-10 Venous insufficiency (chronic) (peripheral) Lymphedema, not elsewhere classified Type 2 diabetes mellitus with foot ulcer Other specified peripheral vascular diseases Non-pressure chronic ulcer of 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d Non-pressure chronic ulcer of other part of right foot with other specified severity Non-pressure chronic ulcer of other part of right lower leg with fat layer exposed End stage renal disease Dependence on renal dialysis Essential (primary) hypertension Procedures Wound #17 Pre-procedure diagnosis of Wound #17 is a Diabetic Wound/Ulcer of the Lower Extremity located on the [place] .Severity of Tissue Pre Debridement is: Fat layer exposed. There was a Selective/Open Wound Non-Viable Tissue Debridement with a total area of 0.25 sq cm performed by Worthy Keeler, PA. With the following instrument(s): Curette to remove Non-Viable tissue/material. Material removed includes Callus. No specimens were taken. A time out was conducted at 08:34, prior to the start of the procedure. There was no bleeding. The procedure was tolerated well with a pain level of 0 throughout and a pain level of 0 following the procedure. Post Debridement Measurements: 0.2cm length x 0.2cm width x 0.1cm depth; 0.003cm^3 volume. Character of Wound/Ulcer Post Debridement is improved. Severity of Tissue Post Debridement is: Fat layer exposed. Post procedure Diagnosis Wound #17: Same as Pre-Procedure Wound  #18 Pre-procedure diagnosis of Wound #18 is a Diabetic Wound/Ulcer of the Lower Extremity located on the Right,Proximal,Lateral Foot .Severity of Tissue Pre Debridement is: Bone involvement without necrosis. There was a Excisional Skin/Subcutaneous Tissue/Muscle/Bone Debridement with a total area of 5.4 sq cm performed by Worthy Keeler, PA. With the following instrument(s): Curette to remove Viable and Non-Viable tissue/material. Material removed includes Bone,T endon, Subcutaneous Tissue, and Slough. 2 specimens were taken by a Swab and Tissue Culture and sent to the lab per facility protocol. A time out was conducted at 08:34, prior to the start of the procedure. There was no bleeding. The procedure was tolerated well with a pain level of 0  throughout and a pain level of 0 following the procedure. Post Debridement Measurements: 3cm length x 1.8cm width x 0.5cm depth; 2.121cm^3 volume. Character of Wound/Ulcer Post Debridement is improved. Severity of Tissue Post Debridement is: Bone involvement without necrosis. Post procedure Diagnosis Wound #18: Same as Pre-Procedure Plan Follow-up Appointments: Return Appointment in 1 week. Edema Control - Lymphedema / SCD / Other: Elevate legs to the level of the heart or above for 30 minutes daily and/or when sitting, a frequency of: - throughout the day Avoid standing for long periods of time. Moisturize legs daily. Laboratory ordered were: Anerobic culture - Bone - right proximal lateral foot, Biopsy specimen culture - Bone - right proximal lateral foot WOUND #17: - Foot Wound Laterality: Plantar, Right Cleanser: Normal Saline 1 x Per Day/ Discharge Instructions: Cleanse the wound with Normal Saline prior to applying a clean dressing using gauze sponges, not tissue or cotton balls. Peri-Wound Care: Sween Lotion (Moisturizing lotion) 1 x Per Day/ Discharge Instructions: Apply moisturizing lotion as directed Prim Dressing: Promogran Prisma Matrix,  4.34 (sq in) (silver collagen) 1 x Per Day/ ary Discharge Instructions: Moisten collagen with saline or hydrogel Secondary Dressing: Woven Gauze Sponge, Non-Sterile 4x4 in 1 x Per Day/ Discharge Instructions: Apply over primary dressing as directed. Secured With: Elastic Bandage 4 inch (ACE bandage) 1 x Per Day/ Discharge Instructions: Secure with ACE bandage as directed. Secured With: [employer], 4.5x3.1 (in/yd) 1 x Per Day/ Discharge Instructions: Secure with Kerlix as directed. WOUND #18: - Foot Wound Laterality: Right, Lateral, Proximal Cleanser: Normal Saline 1 x Per Day/ Discharge Instructions: Cleanse the wound with Normal Saline prior to applying a clean dressing using gauze sponges, not tissue or cotton balls. Peri-Wound Care: Sween Lotion (Moisturizing lotion) 1 x Per Day/ Discharge Instructions: Apply moisturizing lotion as directed Prim Dressing: Santyl Ointment 1 x Per Day/ ary Discharge Instructions: Apply nickel thick amount to wound bed as instructed Secondary Dressing: Woven Gauze Sponge, Non-Sterile 4x4 in 1 x Per Day/ Discharge Instructions: Apply over primary dressing as directed. Secured With: Elastic Bandage 4 inch (ACE bandage) 1 x Per Day/ Discharge Instructions: Secure with ACE bandage as directed. Secured With: [employer], 4.5x3.1 (in/yd) 1 x Per Day/ Discharge Instructions: Secure with Kerlix as directed. WOUND #19: - Lower Leg Wound Laterality: Right, Anterior Cleanser: Normal Saline 1 x Per Day/ Discharge Instructions: Cleanse the wound with Normal Saline prior to applying a clean dressing using gauze sponges, not tissue or cotton balls. Peri-Wound Care: Sween Lotion (Moisturizing lotion) 1 x Per Day/ Discharge Instructions: Apply moisturizing lotion as directed Prim Dressing: Promogran Prisma Matrix, 4.34 (sq in) (silver collagen) 1 x Per Day/ ary Discharge Instructions: Moisten collagen with saline or hydrogel Secondary Dressing: Woven  Gauze Sponge, Non-Sterile 4x4 in 1 x Per Day/ Discharge Instructions: Apply over primary dressing as directed. Secured With: Elastic Bandage 4 inch (ACE bandage) 1 x Per Day/ Discharge Instructions: Secure with ACE bandage as directed. Secured With: [employer], 4.5x3.1 (in/yd) 1 x Per Day/ Discharge Instructions: Secure with Kerlix as directed. WOUND #20: - Lower Leg Wound Laterality: Right, Medial Cleanser: Normal Saline 1 x Per Day/ Discharge Instructions: Cleanse the wound with Normal Saline prior to applying a clean dressing using gauze sponges, not tissue or cotton balls. Peri-Wound Care: Sween Lotion (Moisturizing lotion) 1 x Per Day/ Discharge Instructions: Apply moisturizing lotion as directed Prim Dressing: Promogran Prisma Matrix, 4.34 (sq in) (silver collagen) 1 x Per Day/ ary Discharge Instructions: Moisten collagen  with saline or hydrogel Secondary Dressing: Woven Gauze Sponge, Non-Sterile 4x4 in 1 x Per Day/ Discharge Instructions: Apply over primary dressing as directed. Secured With: Elastic Bandage 4 inch (ACE bandage) 1 x Per Day/ Discharge Instructions: Secure with ACE bandage as directed. Secured With: [employer], 4.5x3.1 (in/yd) 1 x Per Day/ Discharge Instructions: Secure with Kerlix as directed. WOUND #21: - Calcaneus Wound Laterality: Right Cleanser: Normal Saline 1 x Per Day/ Discharge Instructions: Cleanse the wound with Normal Saline prior to applying a clean dressing using gauze sponges, not tissue or cotton balls. Prim Dressing: Santyl Ointment 1 x Per Day/ ary Discharge Instructions: Apply nickel thick amount to wound bed as instructed Secondary Dressing: Woven Gauze Sponge, Non-Sterile 4x4 in 1 x Per Day/ Discharge Instructions: Apply over primary dressing as directed. Secured With: Elastic Bandage 4 inch (ACE bandage) 1 x Per Day/ Discharge Instructions: Secure with ACE bandage as directed. Secured With: [employer], 4.5x3.1  (in/yd) 1 x Per Day/ Discharge Instructions: Secure with Kerlix as directed. 1. Would recommend currently that we actually utilize Santyl to the right lateral foot as well as the right heel to try to help clean up these areas more effectively and quickly. In regard to the plantar foot and the leg ulcers we will use silver collagen on both of these areas. 2. I do not want her wrap her as I want them to be able to change the Santyl daily at the facility therefore we will get it secured with ABD pads, roll gauze, and a ace wrap and again this should be changed daily at all wound locations. 3. I am also can recommend the patient needs to try to elevate her legs much as possible to help with edema control. 4. I am also going to suggest that the patient does need to have pathology of the bone that was removed I did send that today as well as culture of the bone as well to identify if she needs any antibiotics and if so for how long. The good news is underneath what was removed from the necrotic bone standpoint there does appear to be a fairly solid bone underlying. Nonetheless we may need to have an MRI to see the extent of damage in the foot in general. We will see patient back for reevaluation in 1 week here in the clinic. If anything worsens or changes patient will contact our office for additional recommendations. Electronic Signature(s) Signed: 04/11/2020 10:15:55 AM By: Worthy Keeler PA-C Entered By: Worthy Keeler on 04/11/2020 10:15:55 -------------------------------------------------------------------------------- HxROS Details Patient Name: Date of Service: Pamela Pilon, DO NNA J. 04/11/2020 7:30 A M Medical Record Number: [id number] Patient Account Number: [id number] Date of Birth/Sex: Treating RN: [birth year] (75 y.o. Helene Shoe, Meta.Reding Primary Care Provider: [name] Other Clinician: Referring Provider: Treating Provider/Extender: Azucena Kuba in  Treatment: 10 Information Obtained From Patient Eyes Medical History: Positive for: Cataracts - removed Hematologic/Lymphatic Medical History: Positive for: Anemia Respiratory Medical History: Positive for: Asthma Cardiovascular Medical History: Positive for: Congestive Heart Failure; Coronary Artery Disease; Hypertension; Peripheral Arterial Disease; Peripheral Venous Disease Past Medical History Notes: Hyperlipidemia, Cardiomyopathy, s/p fem-pop in 12/20 Endocrine Medical History: Positive for: Type II Diabetes Time with diabetes: since 2000 Treated with: Insulin Blood sugar tested every day: Yes Tested : 1-2x a day Genitourinary Medical History: Positive for: End Stage Renal Disease - Hemodialysis 3x/week Integumentary (Skin) Medical History: Negative for: History of Burn Musculoskeletal Medical History: Positive  for: Osteoarthritis; Osteomyelitis - Left 5th toe Past Medical History Notes: 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 Neurologic Medical History: Positive for: Neuropathy Oncologic Medical History: Positive for: Received Radiation Past Medical History Notes: Breast cancer 2014 with lumpectomy HBO Extended History Items Eyes: Cataracts Immunizations Pneumococcal Vaccine: Received Pneumococcal Vaccination: Yes Tetanus Vaccine: Last tetanus shot: 01/13/2012 Implantable Devices None Hospitalization / Surgery History Type of Hospitalization/Surgery Right Femoral Bypass 03/16/2020 Family and Social History Cancer: Yes - Father,Siblings; Diabetes: Yes - Maternal Grandparents,Paternal Grandparents,Mother,Father,Siblings; Heart Disease: Yes - Maternal Grandparents,Paternal Grandparents,Mother,Father,Siblings; Hereditary Spherocytosis: No; Hypertension: Yes - Maternal Grandparents,Paternal Grandparents; Kidney Disease: Yes - Siblings; Lung Disease: No; Seizures: No; Stroke: No; Thyroid Problems: No; Tuberculosis: No; Never smoker; Marital Status - Married;  Alcohol Use: Never; Drug Use: No History; Caffeine Use: Rarely; Financial Concerns: No; Food, Clothing or Shelter Needs: No; Support System Lacking: No; Transportation Concerns: No Electronic Signature(s) Signed: 04/11/2020 4:44:38 PM By: Worthy Keeler PA-C Signed: 04/11/2020 5:17:23 PM By: Deon Pilling Entered By: Deon Pilling on 04/11/2020 08:05:35 -------------------------------------------------------------------------------- SuperBill Details Patient Name: Date of Service: Pamela Pilon, DO NNA J. 04/11/2020 Medical Record Number: [id number] Patient Account Number: [id number] Date of Birth/Sex: Treating RN: Dec 03, 1945 (75 y.o. [name] Primary Care Provider: [name] Other Clinician: Referring Provider: Treating Provider/Extender: Azucena Kuba in Treatment: 10 Diagnosis Coding ICD-10 Codes Code Description I87.2 Venous insufficiency (chronic) (peripheral) I89.0 Lymphedema, not elsewhere classified E11.621 Type 2 diabetes mellitus with foot ulcer I73.89 Other specified peripheral vascular diseases L97.512 Non-pressure chronic ulcer of 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d L97.518 Non-pressure chronic ulcer of other part of right foot with other specified severity L97.812 Non-pressure chronic ulcer of other part of right lower leg with fat layer exposed N18.6 End stage renal disease Z99.2 Dependence on renal dialysis I10 Essential (primary) hypertension Facility Procedures CPT4 Code: [id number] Description: 11044 - DEB BONE 20 SQ CM/< ICD-10 Diagnosis Description L97.518 Non-pressure chronic ulcer of other part of right foot with other specified sever Modifier: ity Quantity: 1 CPT4 Code: 12751700 Description: 17494 - DEBRIDE WOUND 1ST 20 SQ CM OR < ICD-10 Diagnosis Description I73.89 Other specified peripheral vascular diseases Modifier: Quantity: 1 Physician Procedures CPT4: Description Modifier Code [id number] 99214 - WC PHYS  LEVEL 4 - EST PT 25 ICD-10 Diagnosis Description I89.0 Lymphedema, not elsewhere classified I87.2 Venous insufficiency (chronic) (peripheral) L97.512 Non-pressure chronic ulcer of other part of  right foot with fat layer exposed L97.518 Non-pressure chronic ulcer of other part of right foot with other specified severity Quantity: 1 CPT4: 6384665 Debridement; bone (includes epidermis, dermis, subQ tissue, muscle and/or fascia, if performed) 1st 20 sqcm or less ICD-10 Diagnosis Description L97.518 Non-pressure chronic ulcer of other part of right foot with other specified severity Quantity: 1 CPT4: 9935701 97597 - WC PHYS DEBR WO ANESTH 20 SQ CM ICD-10 Diagnosis Description I73.89 Other specified peripheral vascular diseases Quantity: 1 Electronic Signature(s) Signed: 04/11/2020 10:16:33 AM By: Worthy Keeler PA-C Entered By: Worthy Keeler on 04/11/2020 10:16:32

## 2020-04-11 NOTE — Progress Notes (Addendum)
ANGELEIGH, CHIASSON ([id number]) Visit Report for 04/11/2020 Arrival Information Details Patient Name: Date of Service: [name], [place] J. 04/11/2020 7:30 A M Medical Record Number: [id number] Patient Account Number: [id number] Date of Birth/Sex: Treating RN: [birth year] (75 y.o. Helene Shoe, Meta.Reding Primary Care Eller Sweis: [name] Other Clinician: Referring Ragen Laver: Treating Chandria Rookstool/Extender: Azucena Kuba in Treatment: 10 Visit Information History Since Last Visit All ordered tests and consults were completed: Yes Patient Arrived: Wheel Chair Added or deleted any medications: Yes Arrival Time: 07:35 Any new allergies or adverse reactions: No Accompanied By: self Had a fall or experienced change in No Transfer Assistance: None activities of daily living that may affect Patient Identification Verified: Yes risk of falls: Secondary Verification Process Completed: Yes Signs or symptoms of abuse/neglect since last visito No Patient Requires Transmission-Based Precautions: No Hospitalized since last visit: Yes Patient Has Alerts: Yes Implantable device outside of the clinic excluding No Patient Alerts: R ABI = 1.08 cellular tissue based products placed in the center L ABI = [place] TBI = 0.79 since last visit: 03/2020 Has Dressing in Place as Prescribed: Yes Has Footwear/Offloading in Place as Prescribed: Yes Right: Wedge Shoe Pain Present Now: No Electronic Signature(s) Signed: 04/11/2020 5:18:45 PM By: [name] RN, BSN Entered By: [name] on 04/11/2020 08:35:29 -------------------------------------------------------------------------------- Clinic Level of Care Assessment Details Patient Name: Date of Service: Pamela Pilon, DO NNA J. 04/11/2020 7:30 A M Medical Record Number: [id number] Patient Account Number: [id number] Date of Birth/Sex: Treating RN: Feb 24, 1946 (75 y.o. [name] Primary Care Mykira Hofmeister: [name] Other  Clinician: Referring Norleen Xie: Treating Derran Sear/Extender: Azucena Kuba in Treatment: 10 Clinic Level of Care Assessment Items TOOL 3 Quantity Score X- 1 0 Use when EandM and Procedure is performed on FOLLOW-UP visit ASSESSMENTS - Nursing Assessment / Reassessment X- 1 10 Reassessment of Co-morbidities (includes updates in patient status) X- 1 5 Reassessment of Adherence to Treatment Plan ASSESSMENTS - Wound and Skin Assessment / Reassessment '[]'  - Points for Wound Assessment can only be taken for a new wound of unknown or different etiology and a procedure is 0 NOT performed to that wound '[]'  - 0 Simple Wound Assessment / Reassessment - one wound X- 2 5 Complex Wound Assessment / Reassessment - multiple wounds '[]'  - 0 Dermatologic / Skin Assessment (not related to wound area) ASSESSMENTS - Focused Assessment '[]'  - 0 Circumferential Edema Measurements - multi extremities '[]'  - 0 Nutritional Assessment / Counseling / Intervention X- 1 5 Lower Extremity Assessment (monofilament, tuning fork, pulses) '[]'  - 0 Peripheral Arterial Disease Assessment (using hand held doppler) ASSESSMENTS - Ostomy and/or Continence Assessment and Care '[]'  - 0 Incontinence Assessment and Management '[]'  - 0 Ostomy Care Assessment and Management (repouching, etc.) PROCESS - Coordination of Care '[]'  - Points for Discharge Coordination can only be taken for a new wound of unknown or different etiology and a 0 procedure is NOT performed to that wound X- 1 15 Simple Patient / Family Education for ongoing care '[]'  - 0 Complex (extensive) Patient / Family Education for ongoing care X- 1 10 Staff obtains Programmer, systems, Records, T Results / Process Orders est X- 1 10 Staff telephones HHA, Nursing Homes / Clarify orders / etc '[]'  - 0 Routine Transfer to another Facility (non-emergent condition) '[]'  - 0 Routine Hospital Admission (non-emergent condition) '[]'  - 0 New Admissions / Medical laboratory scientific officer / Ordering NPWT Apligraf, etc. , '[]'  - 0 Emergency [facility]  Admission (emergent condition) X- 1 10 Simple Discharge Coordination '[]'  - 0 Complex (extensive) Discharge Coordination PROCESS - Special Needs '[]'  - 0 Pediatric / Minor Patient Management '[]'  - 0 Isolation Patient Management '[]'  - 0 Hearing / Language / Visual special needs '[]'  - 0 Assessment of Community assistance (transportation, D/C planning, etc.) '[]'  - 0 Additional assistance / Altered mentation '[]'  - 0 Support Surface(s) Assessment (bed, cushion, seat, etc.) INTERVENTIONS - Wound Cleansing / Measurement '[]'  - Points for Wound Cleaning / Measurement, Wound Dressing, Specimen Collection and Specimen taken to lab can 0 only be taken for a new wound of unknown or different etiology and a procedure is NOT performed to that wound '[]'  - 0 Simple Wound Cleansing - one wound X- 2 5 Complex Wound Cleansing - multiple wounds '[]'  - 0 Wound Imaging (photographs - any number of wounds) '[]'  - 0 Wound Tracing (instead of photographs) '[]'  - 0 Simple Wound Measurement - one wound X- 2 5 Complex Wound Measurement - multiple wounds INTERVENTIONS - Wound Dressings X - Small Wound Dressing one or multiple wounds 1 10 '[]'  - 0 Medium Wound Dressing one or multiple wounds '[]'  - 0 Large Wound Dressing one or multiple wounds INTERVENTIONS - Miscellaneous '[]'  - 0 External ear exam '[]'  - 0 Specimen Collection (cultures, biopsies, blood, body fluids, etc.) '[]'  - 0 Specimen(s) / Culture(s) sent or taken to Lab for analysis '[]'  - 0 Patient Transfer (multiple staff / Civil Service fast streamer / Similar devices) '[]'  - 0 Simple Staple / Suture removal (25 or less) '[]'  - 0 Complex Staple / Suture removal (26 or more) '[]'  - 0 Hypo / Hyperglycemic Management (close monitor of Blood Glucose) '[]'  - 0 Ankle / Brachial Index (ABI) - do not check if billed separately X- 1 5 Vital Signs Has the patient been seen at the hospital within the last three  years: Yes Total Score: 110 Level Of Care: New/Established - Level 3 Electronic Signature(s) Signed: 04/11/2020 5:18:45 PM By: [name] RN, BSN Entered By: [name] on 04/11/2020 10:02:08 -------------------------------------------------------------------------------- Encounter Discharge Information Details Patient Name: Date of Service: Pamela Pilon, DO NNA J. 04/11/2020 7:30 A M Medical Record Number: [id number] Patient Account Number: [id number] Date of Birth/Sex: Treating RN: 01/06/1946 (75 y.o. Helene Shoe, Meta.Reding Primary Care Inessa Wardrop: [name] Other Clinician: Referring Aminah Zabawa: Treating Kathe Wirick/Extender: Azucena Kuba in Treatment: 10 Encounter Discharge Information Items Post Procedure Vitals Discharge Condition: Stable Temperature (F): 98.4 Ambulatory Status: Wheelchair Pulse (bpm): 83 Discharge Destination: [place] Respiratory Rate (breaths/min): 16 Telephoned: No Blood Pressure (mmHg): 145/74 Orders Sent: Yes Transportation: Private Auto Accompanied By: self Schedule Follow-up Appointment: Yes Clinical Summary of Care: Electronic Signature(s) Signed: 04/11/2020 5:17:23 PM By: Deon Pilling Entered By: Deon Pilling on 04/11/2020 10:06:01 -------------------------------------------------------------------------------- Lower Extremity Assessment Details Patient Name: Date of Service: Pamela Pilon, DO NNA J. 04/11/2020 7:30 A M Medical Record Number: [id number] Patient Account Number: [id number] Date of Birth/Sex: Treating RN: 1945-07-27 (75 y.o. Helene Shoe, Meta.Reding Primary Care Shaylyn Bawa: [name] Other Clinician: Referring Yocelyn Brocious: Treating Avalee Castrellon/Extender: Azucena Kuba in Treatment: 10 Edema Assessment Assessed: [Left: No] Patrice Paradise: Yes] Edema: [Left: N] [Right: o] Calf Left: Right: Point of Measurement: 43 cm From Medial Instep 31 cm Ankle Left: Right: Point of  Measurement: 11 cm From Medial Instep 19 cm Vascular Assessment Pulses: Dorsalis Pedis Palpable: [Right:Yes] Electronic Signature(s) Signed: 04/11/2020 5:17:23 PM By: Deon Pilling Entered By: Deon Pilling on 04/11/2020  07:59:50 -------------------------------------------------------------------------------- Multi-Disciplinary Care Plan Details Patient Name: Date of Service: [name], [place] NNA J. 04/11/2020 7:30 A M Medical Record Number: [id number] Patient Account Number: [id number] Date of Birth/Sex: Treating RN: Jan 18, 1946 (75 y.o. [name] Primary Care Leveta Wahab: [name] Other Clinician: Referring Gergory Biello: Treating Jiya Kissinger/Extender: Azucena Kuba in Treatment: 10 Active Inactive Wound/Skin Impairment Nursing Diagnoses: Impaired tissue integrity Knowledge deficit related to ulceration/compromised skin integrity Goals: Patient/caregiver will verbalize understanding of skin care regimen Date Initiated: 02/08/2020 Target Resolution Date: 05/11/2020 Goal Status: Active Ulcer/skin breakdown will have a volume reduction of 30% by week 4 Date Initiated: 02/08/2020 Date Inactivated: 04/11/2020 Target Resolution Date: 03/07/2020 Goal Status: Met Interventions: Assess patient/caregiver ability to obtain necessary supplies Assess patient/caregiver ability to perform ulcer/skin care regimen upon admission and as needed Assess ulceration(s) every visit Provide education on ulcer and skin care Treatment Activities: Skin care regimen initiated : 02/08/2020 Topical wound management initiated : 02/08/2020 Notes: Electronic Signature(s) Signed: 04/11/2020 5:18:45 PM By: [name] RN, BSN Entered By: [name] on 04/11/2020 08:39:19 -------------------------------------------------------------------------------- Pain Assessment Details Patient Name: Date of Service: Pamela Pilon, DO NNA J. 04/11/2020 7:30 A M Medical Record Number:  [id number] Patient Account Number: [id number] Date of Birth/Sex: Treating RN: October 17, 1945 (75 y.o. Helene Shoe, Meta.Reding Primary Care Chad Donoghue: [name] Other Clinician: Referring Nareh Matzke: Treating Oshay Stranahan/Extender: Azucena Kuba in Treatment: 10 Active Problems Location of Pain Severity and Description of Pain Patient Has Paino No Site Locations Pain Management and Medication Current Pain Management: Medication: No Cold Application: No Rest: No Massage: No Activity: No T.E.N.S.: No Heat Application: No Leg drop or elevation: No Is the Current Pain Management Adequate: Adequate How does your wound impact your activities of daily livingo Sleep: No Bathing: No Appetite: No Relationship With Others: No Bladder Continence: No Emotions: No Bowel Continence: No Work: No Toileting: No Drive: No Dressing: No Hobbies: No Electronic Signature(s) Signed: 04/11/2020 5:17:23 PM By: Deon Pilling Entered By: Deon Pilling on 04/11/2020 07:59:38 -------------------------------------------------------------------------------- Patient/Caregiver Education Details Patient Name: Date of Service: Pamela Pilon, DO NNA J. 1/5/2022andnbsp7:30 A M Medical Record Number: [id number] Patient Account Number: [id number] Date of Birth/Gender: Treating RN: 1945/06/20 (75 y.o. [name] Primary Care Physician: [name] Other Clinician: Referring Physician: Treating Physician/Extender: Azucena Kuba in Treatment: 10 Education Assessment Education Provided To: Patient Education Topics Provided Wound/Skin Impairment: Methods: Explain/Verbal Responses: State content correctly Electronic Signature(s) Signed: 04/11/2020 5:18:45 PM By: [name] RN, BSN Entered By: [name] on 04/11/2020 09:01:05 -------------------------------------------------------------------------------- Wound Assessment Details Patient  Name: Date of Service: Pamela Pilon, DO NNA J. 04/11/2020 7:30 A M Medical Record Number: [id number] Patient Account Number: [id number] Date of Birth/Sex: Treating RN: Feb 14, 1946 (75 y.o. Helene Shoe, Meta.Reding Primary Care Jhalil Silvera: [name] Other Clinician: Referring Gemini Beaumier: Treating Berdell Hostetler/Extender: Azucena Kuba in Treatment: 10 Wound Status Wound Number: 16 Primary Diabetic Wound/Ulcer of the Lower Extremity Etiology: Wound Location: Right, Distal, Lateral Foot Wound Open Wounding Event: Gradually Appeared Status: Date Acquired: 11/06/2019 Comorbid Cataracts, Anemia, Asthma, Congestive Heart Failure, Coronary Weeks Of Treatment: 10 History: Artery Disease, Hypertension, Peripheral Arterial Disease, Clustered Wound: No Peripheral Venous Disease, Type II Diabetes, End Stage Renal Disease, Osteoarthritis, Osteomyelitis, Neuropathy, Received Radiation Photos Photo Uploaded By: Mikeal Hawthorne on 04/17/2020 12:56:27 Wound Measurements Length: (cm) Width: (cm) Depth: (cm) Area: (cm) Volume: (cm) 0 % Reduction in Area: 100% 0 % Reduction  in Volume: 100% 0 Epithelialization: Large (67-100%) 0 Tunneling: No 0 Undermining: No Wound Description Classification: Grade 2 Wound Margin: Distinct, outline attached Exudate Amount: None Present Foul Odor After Cleansing: No Slough/Fibrino No Wound Bed Granulation Amount: None Present (0%) Exposed Structure Necrotic Amount: None Present (0%) Fascia Exposed: No Fat Layer (Subcutaneous Tissue) Exposed: No Tendon Exposed: No Muscle Exposed: No Joint Exposed: No Bone Exposed: No Electronic Signature(s) Signed: 04/11/2020 5:17:23 PM By: Deon Pilling Entered By: Deon Pilling on 04/11/2020 07:56:05 -------------------------------------------------------------------------------- Wound Assessment Details Patient Name: Date of Service: Pamela Pilon, DO NNA J. 04/11/2020 7:30 A M Medical Record Number:  [id number] Patient Account Number: [id number] Date of Birth/Sex: Treating RN: Mar 01, 1946 (75 y.o. Helene Shoe, Meta.Reding Primary Care Fitzgerald Dunne: [name] Other Clinician: Referring Jalil Lorusso: Treating Varnika Butz/Extender: Azucena Kuba in Treatment: 10 Wound Status Wound Number: 17 Primary Diabetic Wound/Ulcer of the Lower Extremity Etiology: Wound Location: Right, Plantar Foot Wound Open Wounding Event: Gradually Appeared Status: Date Acquired: 11/06/2018 Comorbid Cataracts, Anemia, Asthma, Congestive Heart Failure, Coronary Weeks Of Treatment: 10 History: Artery Disease, Hypertension, Peripheral Arterial Disease, Clustered Wound: No Peripheral Venous Disease, Type II Diabetes, End Stage Renal Disease, Osteoarthritis, Osteomyelitis, Neuropathy, Received Radiation Photos Photo Uploaded By: Mikeal Hawthorne on 04/17/2020 12:56:28 Wound Measurements Length: (cm) 0.2 Width: (cm) 0.2 Depth: (cm) 0.1 Area: (cm) 0.031 Volume: (cm) 0.003 % Reduction in Area: 34% % Reduction in Volume: 84.2% Epithelialization: Large (67-100%) Tunneling: No Undermining: No Wound Description Classification: Grade 2 Wound Margin: Thickened Exudate Amount: Small Exudate Type: Serosanguineous Exudate Color: red, brown Foul Odor After Cleansing: No Slough/Fibrino No Wound Bed Granulation Amount: Large (67-100%) Exposed Structure Granulation Quality: Pink Fascia Exposed: No Necrotic Amount: None Present (0%) Fat Layer (Subcutaneous Tissue) Exposed: Yes Tendon Exposed: No Muscle Exposed: No Joint Exposed: No Bone Exposed: No Treatment Notes Wound #17 (Foot) Wound Laterality: Plantar, Right Cleanser Normal Saline Discharge Instruction: Cleanse the wound with Normal Saline prior to applying a clean dressing using gauze sponges, not tissue or cotton balls. Peri-Wound Care Sween Lotion (Moisturizing lotion) Discharge Instruction: Apply moisturizing lotion as  directed Topical Primary Dressing Promogran Prisma Matrix, 4.34 (sq in) (silver collagen) Discharge Instruction: Moisten collagen with saline or hydrogel Secondary Dressing Woven Gauze Sponge, Non-Sterile 4x4 in Discharge Instruction: Apply over primary dressing as directed. Secured With Elastic Bandage 4 inch (ACE bandage) Discharge Instruction: Secure with ACE bandage as directed. Kerlix Roll Sterile, 4.5x3.1 (in/yd) Discharge Instruction: Secure with Kerlix as directed. Compression Wrap Compression Stockings Add-Ons Electronic Signature(s) Signed: 04/11/2020 5:17:23 PM By: Deon Pilling Signed: 04/11/2020 5:18:45 PM By: [name] RN, BSN Entered By: [name] on 04/11/2020 08:36:36 -------------------------------------------------------------------------------- Wound Assessment Details Patient Name: Date of Service: Pamela Pilon, DO NNA J. 04/11/2020 7:30 A M Medical Record Number: [id number] Patient Account Number: [id number] Date of Birth/Sex: Treating RN: 20-Jun-1945 (75 y.o. Helene Shoe, Meta.Reding Primary Care Winfred Iiams: [name] Other Clinician: Referring Khamryn Calderone: Treating Lenell Mcconnell/Extender: Azucena Kuba in Treatment: 10 Wound Status Wound Number: 18 Primary Diabetic Wound/Ulcer of the Lower Extremity Etiology: Wound Location: Right, Proximal, Lateral Foot Wound Open Wounding Event: Gradually Appeared Status: Date Acquired: 11/06/2018 Comorbid Cataracts, Anemia, Asthma, Congestive Heart Failure, Coronary Weeks Of Treatment: 10 History: Artery Disease, Hypertension, Peripheral Arterial Disease, Clustered Wound: No Peripheral Venous Disease, Type II Diabetes, End Stage Renal Disease, Osteoarthritis, Osteomyelitis, Neuropathy, Received Radiation Photos Photo Uploaded By: Mikeal Hawthorne on 04/17/2020 12:56:03 Wound Measurements Length: (cm) 3 Width: (cm) 1.8 Depth: (cm)  0.5 Area: (cm) 4.241 Volume: (cm) 2.121 % Reduction in  Area: 16.2% % Reduction in Volume: -39.8% Epithelialization: Small (1-33%) Tunneling: No Undermining: No Wound Description Classification: Grade 2 Wound Margin: Epibole Exudate Amount: Medium Exudate Type: Serosanguineous Exudate Color: red, brown Foul Odor After Cleansing: No Slough/Fibrino Yes Wound Bed Granulation Amount: Small (1-33%) Exposed Structure Granulation Quality: Pink Fascia Exposed: No Necrotic Amount: Large (67-100%) Fat Layer (Subcutaneous Tissue) Exposed: Yes Necrotic Quality: Adherent Slough Tendon Exposed: Yes Muscle Exposed: No Joint Exposed: No Bone Exposed: Yes Treatment Notes Wound #18 (Foot) Wound Laterality: Right, Lateral, Proximal Cleanser Normal Saline Discharge Instruction: Cleanse the wound with Normal Saline prior to applying a clean dressing using gauze sponges, not tissue or cotton balls. Peri-Wound Care Sween Lotion (Moisturizing lotion) Discharge Instruction: Apply moisturizing lotion as directed Topical Primary Dressing Santyl Ointment Discharge Instruction: Apply nickel thick amount to wound bed as instructed Secondary Dressing Woven Gauze Sponge, Non-Sterile 4x4 in Discharge Instruction: Apply over primary dressing as directed. Secured With Elastic Bandage 4 inch (ACE bandage) Discharge Instruction: Secure with ACE bandage as directed. Kerlix Roll Sterile, 4.5x3.1 (in/yd) Discharge Instruction: Secure with Kerlix as directed. Compression Wrap Compression Stockings Add-Ons Electronic Signature(s) Signed: 04/11/2020 5:17:23 PM By: Deon Pilling Signed: 04/11/2020 5:18:45 PM By: [name] RN, BSN Signed: 04/11/2020 5:18:45 PM By: [name] RN, BSN Entered By: [name] on 04/11/2020 08:48:01 -------------------------------------------------------------------------------- Wound Assessment Details Patient Name: Date of Service: Pamela Pilon, DO NNA J. 04/11/2020 7:30 A M Medical Record Number: [id number] Patient Account  Number: [id number] Date of Birth/Sex: Treating RN: 10-Jun-1945 (75 y.o. Helene Shoe, Meta.Reding Primary Care Damyen Knoll: [name] Other Clinician: Referring Illyria Sobocinski: Treating Albertina Leise/Extender: Azucena Kuba in Treatment: 10 Wound Status Wound Number: 19 Primary Venous Leg Ulcer Etiology: Wound Location: Right, Anterior Lower Leg Secondary Diabetic Wound/Ulcer of the Lower Extremity Wounding Event: Blister Etiology: Date Acquired: 03/27/2020 Wound Open Weeks Of Treatment: 0 Status: Clustered Wound: Yes Comorbid Cataracts, Anemia, Asthma, Congestive Heart Failure, Coronary History: Artery Disease, Hypertension, Peripheral Arterial Disease, Peripheral Venous Disease, Type II Diabetes, End Stage Renal Disease, Osteoarthritis, Osteomyelitis, Neuropathy, Received Radiation Photos Wound Measurements Length: (cm) 6.7 Width: (cm) 1.1 Depth: (cm) 0.1 Clustered Quantity: 2 Area: (cm) 5.788 Volume: (cm) 0.579 % Reduction in Area: 0% % Reduction in Volume: 0% Epithelialization: Small (1-33%) Tunneling: No Undermining: No Wound Description Classification: Full Thickness Without Exposed Support Structures Wound Margin: Distinct, outline attached Exudate Amount: Medium Exudate Type: Serosanguineous Exudate Color: red, brown Foul Odor After Cleansing: No Slough/Fibrino Yes Wound Bed Granulation Amount: Medium (34-66%) Exposed Structure Granulation Quality: Red Fascia Exposed: No Necrotic Amount: Medium (34-66%) Fat Layer (Subcutaneous Tissue) Exposed: Yes Necrotic Quality: Adherent Slough Tendon Exposed: No Muscle Exposed: No Joint Exposed: No Bone Exposed: No Treatment Notes Wound #19 (Lower Leg) Wound Laterality: Right, Anterior Cleanser Normal Saline Discharge Instruction: Cleanse the wound with Normal Saline prior to applying a clean dressing using gauze sponges, not tissue or cotton balls. Peri-Wound Care Sween Lotion (Moisturizing  lotion) Discharge Instruction: Apply moisturizing lotion as directed Topical Primary Dressing Promogran Prisma Matrix, 4.34 (sq in) (silver collagen) Discharge Instruction: Moisten collagen with saline or hydrogel Secondary Dressing Woven Gauze Sponge, Non-Sterile 4x4 in Discharge Instruction: Apply over primary dressing as directed. Secured With Elastic Bandage 4 inch (ACE bandage) Discharge Instruction: Secure with ACE bandage as directed. Kerlix Roll Sterile, 4.5x3.1 (in/yd) Discharge Instruction: Secure with Kerlix as directed. Compression Wrap Compression Stockings Add-Ons Electronic Signature(s) Signed: 04/17/2020 2:51:26 PM By:  Deon Pilling Signed: 04/17/2020 4:33:09 PM By: Mikeal Hawthorne EMT/HBOT/SD Previous Signature: 04/11/2020 5:17:23 PM Version By: Deon Pilling Entered By: Mikeal Hawthorne on 04/17/2020 12:55:19 -------------------------------------------------------------------------------- Wound Assessment Details Patient Name: Date of Service: Pamela Pilon, DO NNA J. 04/11/2020 7:30 A M Medical Record Number: [id number] Patient Account Number: [id number] Date of Birth/Sex: Treating RN: 08-11-1945 (75 y.o. Helene Shoe, Meta.Reding Primary Care Rick Carruthers: [name] Other Clinician: Referring Jyaire Koudelka: Treating Emilliano Dilworth/Extender: Azucena Kuba in Treatment: 10 Wound Status Wound Number: 20 Primary Venous Leg Ulcer Etiology: Wound Location: Right, Medial Lower Leg Secondary Diabetic Wound/Ulcer of the Lower Extremity Wounding Event: Blister Etiology: Date Acquired: 03/27/2020 Wound Open Weeks Of Treatment: 0 Status: Clustered Wound: No Comorbid Cataracts, Anemia, Asthma, Congestive Heart Failure, Coronary History: Artery Disease, Hypertension, Peripheral Arterial Disease, Peripheral Venous Disease, Type II Diabetes, End Stage Renal Disease, Osteoarthritis, Osteomyelitis, Neuropathy, Received Radiation Photos Photo Uploaded By: Mikeal Hawthorne on 04/17/2020 12:56:04 Wound Measurements Length: (cm) 0.8 Width: (cm) 1.1 Depth: (cm) 0.1 Area: (cm) 0.691 Volume: (cm) 0.069 % Reduction in Area: % Reduction in Volume: Epithelialization: Small (1-33%) Tunneling: No Undermining: No Wound Description Classification: Full Thickness Without Exposed Support Structures Wound Margin: Distinct, outline attached Exudate Amount: Medium Exudate Type: Serosanguineous Exudate Color: red, brown Foul Odor After Cleansing: No Slough/Fibrino No Wound Bed Granulation Amount: Large (67-100%) Exposed Structure Granulation Quality: Red, Pink Fascia Exposed: No Necrotic Amount: None Present (0%) Fat Layer (Subcutaneous Tissue) Exposed: Yes Tendon Exposed: No Muscle Exposed: No Joint Exposed: No Bone Exposed: No Treatment Notes Wound #20 (Lower Leg) Wound Laterality: Right, Medial Cleanser Normal Saline Discharge Instruction: Cleanse the wound with Normal Saline prior to applying a clean dressing using gauze sponges, not tissue or cotton balls. Peri-Wound Care Sween Lotion (Moisturizing lotion) Discharge Instruction: Apply moisturizing lotion as directed Topical Primary Dressing Promogran Prisma Matrix, 4.34 (sq in) (silver collagen) Discharge Instruction: Moisten collagen with saline or hydrogel Secondary Dressing Woven Gauze Sponge, Non-Sterile 4x4 in Discharge Instruction: Apply over primary dressing as directed. Secured With Elastic Bandage 4 inch (ACE bandage) Discharge Instruction: Secure with ACE bandage as directed. Kerlix Roll Sterile, 4.5x3.1 (in/yd) Discharge Instruction: Secure with Kerlix as directed. Compression Wrap Compression Stockings Add-Ons Electronic Signature(s) Signed: 04/11/2020 5:17:23 PM By: Deon Pilling Entered By: Deon Pilling on 04/11/2020 07:58:32 -------------------------------------------------------------------------------- Wound Assessment Details Patient Name: Date of  Service: Pamela Pilon, DO NNA J. 04/11/2020 7:30 A M Medical Record Number: [id number] Patient Account Number: [id number] Date of Birth/Sex: Treating RN: 1945/06/25 (75 y.o. [name] Primary Care Brytni Dray: [name] Other Clinician: Referring Conny Situ: Treating Tahlia Deamer/Extender: Azucena Kuba in Treatment: 10 Wound Status Wound Number: 21 Primary Diabetic Wound/Ulcer of the Lower Extremity Etiology: Wound Location: Right Calcaneus Secondary Pressure Ulcer Wounding Event: Pressure Injury Etiology: Date Acquired: 04/11/2020 Wound Open Weeks Of Treatment: 0 Status: Clustered Wound: No Comorbid Cataracts, Anemia, Asthma, Congestive Heart Failure, Coronary History: Artery Disease, Hypertension, Peripheral Arterial Disease, Peripheral Venous Disease, Type II Diabetes, End Stage Renal Disease, Osteoarthritis, Osteomyelitis, Neuropathy, Received Radiation Photos Photo Uploaded By: Mikeal Hawthorne on 04/17/2020 13:46:00 Wound Measurements Length: (cm) 1 Width: (cm) 1.5 Depth: (cm) 0.1 Area: (cm) 1.178 Volume: (cm) 0.118 % Reduction in Area: % Reduction in Volume: Epithelialization: None Tunneling: No Undermining: No Wound Description Classification: Unable to visualize wound bed Wound Margin: Flat and Intact Exudate Amount: Medium Exudate Type: Serous Exudate Color: amber Foul Odor After Cleansing: No Slough/Fibrino Yes Wound Bed Granulation Amount: Small (  1-33%) Exposed Structure Granulation Quality: Pink Fascia Exposed: No Necrotic Amount: Large (67-100%) Fat Layer (Subcutaneous Tissue) Exposed: Yes Necrotic Quality: Adherent Slough Tendon Exposed: No Muscle Exposed: No Joint Exposed: No Bone Exposed: No Treatment Notes Wound #21 (Calcaneus) Wound Laterality: Right Cleanser Normal Saline Discharge Instruction: Cleanse the wound with Normal Saline prior to applying a clean dressing using gauze sponges, not tissue or  cotton balls. Peri-Wound Care Topical Primary Dressing Santyl Ointment Discharge Instruction: Apply nickel thick amount to wound bed as instructed Secondary Dressing Woven Gauze Sponge, Non-Sterile 4x4 in Discharge Instruction: Apply over primary dressing as directed. Secured With Elastic Bandage 4 inch (ACE bandage) Discharge Instruction: Secure with ACE bandage as directed. Kerlix Roll Sterile, 4.5x3.1 (in/yd) Discharge Instruction: Secure with Kerlix as directed. Compression Wrap Compression Stockings Add-Ons Electronic Signature(s) Signed: 04/11/2020 5:18:45 PM By: [name] RN, BSN Entered By: [name] on 04/11/2020 08:52:13 -------------------------------------------------------------------------------- Vitals Details Patient Name: Date of Service: Pamela Pilon, DO NNA J. 04/11/2020 7:30 A M Medical Record Number: [id number] Patient Account Number: [id number] Date of Birth/Sex: Treating RN: January 03, 1946 (75 y.o. Helene Shoe, Meta.Reding Primary Care Dayana Dalporto: [name] Other Clinician: Referring Marketa Midkiff: Treating Marishka Rentfrow/Extender: Azucena Kuba in Treatment: 10 Vital Signs Time Taken: 07:35 Temperature (F): 98.4 Height (in): 64 Pulse (bpm): 83 Weight (lbs): 150 Respiratory Rate (breaths/min): 16 Body Mass Index (BMI): 25.7 Blood Pressure (mmHg): 145/74 Reference Range: 80 - 120 mg / dl Electronic Signature(s) Signed: 04/11/2020 5:17:23 PM By: Deon Pilling Entered By: Deon Pilling on 04/11/2020 07:59:30

## 2020-04-11 NOTE — Patient Outreach (Signed)
Member screened for potential [facility] Care Management needs. Mrs. Fernando resides in [facility].   Voicemail left and communication to sent to [facility] to inquire about transition plans.   Telephone call made to Mrs. Happ (mobile) [phone]. No answer. Voicemail box not set up. Telephone call to home [phone] to speak with spouse/DPR Ginette Otto. No answer. HIPAA compliant voicemail message left to request return call.   Will continue to follow while member resides in SNF.    Marthenia Rolling, MSN, RN,BSN [place] Acute Care Coordinator [phone] [facility]) [phone]  (Toll free office)

## 2020-04-12 DIAGNOSIS — T8149XA Infection following a procedure, other surgical site, initial encounter: Secondary | ICD-10-CM | POA: Diagnosis not present

## 2020-04-12 DIAGNOSIS — N186 End stage renal disease: Secondary | ICD-10-CM | POA: Diagnosis not present

## 2020-04-12 DIAGNOSIS — D509 Iron deficiency anemia, unspecified: Secondary | ICD-10-CM | POA: Diagnosis not present

## 2020-04-12 DIAGNOSIS — N2581 Secondary hyperparathyroidism of renal origin: Secondary | ICD-10-CM | POA: Diagnosis not present

## 2020-04-12 DIAGNOSIS — Z992 Dependence on renal dialysis: Secondary | ICD-10-CM | POA: Diagnosis not present

## 2020-04-12 DIAGNOSIS — D631 Anemia in chronic kidney disease: Secondary | ICD-10-CM | POA: Diagnosis not present

## 2020-04-14 DIAGNOSIS — N2581 Secondary hyperparathyroidism of renal origin: Secondary | ICD-10-CM | POA: Diagnosis not present

## 2020-04-14 DIAGNOSIS — N186 End stage renal disease: Secondary | ICD-10-CM | POA: Diagnosis not present

## 2020-04-14 DIAGNOSIS — Z992 Dependence on renal dialysis: Secondary | ICD-10-CM | POA: Diagnosis not present

## 2020-04-14 DIAGNOSIS — T8149XA Infection following a procedure, other surgical site, initial encounter: Secondary | ICD-10-CM | POA: Diagnosis not present

## 2020-04-14 DIAGNOSIS — D631 Anemia in chronic kidney disease: Secondary | ICD-10-CM | POA: Diagnosis not present

## 2020-04-14 DIAGNOSIS — D509 Iron deficiency anemia, unspecified: Secondary | ICD-10-CM | POA: Diagnosis not present

## 2020-04-16 ENCOUNTER — Other Ambulatory Visit: Payer: Self-pay

## 2020-04-16 ENCOUNTER — Ambulatory Visit (INDEPENDENT_AMBULATORY_CARE_PROVIDER_SITE_OTHER): Payer: Self-pay | Admitting: Vascular Surgery

## 2020-04-16 ENCOUNTER — Encounter: Payer: Self-pay | Admitting: Vascular Surgery

## 2020-04-16 VITALS — BP 107/66 | HR 83 | Temp 98.2°F | Resp 12 | Ht 64.0 in | Wt 149.0 lb

## 2020-04-16 DIAGNOSIS — N186 End stage renal disease: Secondary | ICD-10-CM

## 2020-04-16 DIAGNOSIS — Z95828 Presence of other vascular implants and grafts: Secondary | ICD-10-CM

## 2020-04-16 DIAGNOSIS — Z992 Dependence on renal dialysis: Secondary | ICD-10-CM

## 2020-04-16 DIAGNOSIS — I739 Peripheral vascular disease, unspecified: Secondary | ICD-10-CM

## 2020-04-16 LAB — AEROBIC/ANAEROBIC CULTURE W GRAM STAIN (SURGICAL/DEEP WOUND): Gram Stain: NONE SEEN

## 2020-04-16 NOTE — Progress Notes (Signed)
[facility] [place]  Patient name: [name] MRN: [id number] DOB: [birth year] Sex: female  REASON FOR VISIT: Follow-up recent right external iliac and common femoral endarterectomy and right femoral to anterior tibial bypass for critical limb ischemia.  Surgery was 03/16/2020  HPI: ILANNA [name] is a 75 y.o. female here today for follow-up.  She was discharged from the hospital to a skilled nursing facility for rehab.  She has been followed at the [facility] wound center as she had been preoperatively.  She looks pretty exhausted today.  Alert and oriented but seems to be spent.  Current Outpatient Medications  Medication Sig Dispense Refill  . allopurinol (ZYLOPRIM) 100 MG tablet TAKE 1 TABLET TWICE DAILY TO PREVENT GOUT 180 tablet 3  . ARTIFICIAL TEAR SOLUTION OP Place 1 drop into both eyes 2 (two) times daily as needed (dry eyes). Refresh    . aspirin EC 81 MG tablet Take 81 mg by mouth daily as needed (Will not take if take Excedrin).     Marland Kitchen atorvastatin (LIPITOR) 10 MG tablet TAKE 1 TABLET DAILY FOR CHOLESTEROL. 90 tablet 1  . b complex-vitamin c-folic acid (NEPHRO-VITE) 0.8 MG TABS tablet Take 1 tablet by mouth daily.     . bisacodyl (DULCOLAX) 10 MG suppository Place 10 mg rectally as needed for moderate constipation.    . cinacalcet (SENSIPAR) 30 MG tablet Take 1 tablet (30 mg total) by mouth daily with supper. 60 tablet 1  . cyclobenzaprine (FLEXERIL) 5 MG tablet Take 1 tablet (5 mg total) by mouth 3 (three) times daily as needed for muscle spasms. 60 tablet 1  . docusate sodium (COLACE) 100 MG capsule Take 100 mg by mouth 2 (two) times daily.     Marland Kitchen glucose blood (PRODIGY NO CODING BLOOD GLUC) test strip USE 1 STRIP TO CHECK GLUCOSE THREE TIMES DAILY-DX-E11.9 250 each 0  . hydrALAZINE (APRESOLINE) 25 MG tablet Take 75 mg by mouth 3 (three) times daily. Take 3 tablets to = 75 mg TID    . HYDROcodone-acetaminophen  (NORCO/VICODIN) 5-325 MG tablet Take 1 tablet by mouth every 6 (six) hours as needed. 20 tablet 0  . insulin NPH-regular Human (70-30) 100 UNIT/ML injection Inject 10 Units into the skin 2 (two) times daily with a meal.    . Insulin Pen Needle (BD PEN NEEDLE NANO U/F) 32G X 4 MM MISC USE TO INJECT TWICE DAILY LANTUS INJECTIONS 180 each 3  . Insulin Syringe-Needle U-100 (BD INSULIN SYRINGE U/F) 31G X 5/16" 1 ML MISC Administer Insulin      3 x /day      with Meals      (Dx-e11.29) (Patient taking differently: Administer Insulin      3 x /day      with Meals      (Dx-e11.29)) 300 each 0  . isosorbide dinitrate (ISORDIL) 10 MG tablet Take 10 mg by mouth 3 (three) times daily.    Marland Kitchen METAMUCIL FIBER PO Take 1 capsule by mouth daily as needed (Constipation).     . Multiple Vitamins-Minerals (PRESERVISION AREDS 2 PO) Take 2 capsules by mouth daily at 12 noon.     . Nutritional Supplements (NEPRO) LIQD Take by mouth 2 (two) times daily.    . sevelamer carbonate (RENVELA) 800 MG tablet Take 2 tablets (1,600 mg total) by mouth 3 (three) times daily with meals. 90 tablet 1  . traZODone (DESYREL) 50 MG tablet Take 25-50 mg by mouth at bedtime.  No current facility-administered medications for this visit.     PHYSICAL EXAM: Vitals:   04/16/20 1356  BP: 107/66  Pulse: 83  Resp: 12  Temp: 98.2 F (36.8 C)  TempSrc: Other (Comment)  SpO2: 94%  Weight: 149 lb (67.6 kg)  Height: 5\' 4"  (1.626 m)    GENERAL: The patient is a well-nourished female, in no acute distress. The vital signs are documented above. Her right groin incision itself is completely healed.  There is erythema and tenderness in her medial thigh distal to the incision.  She has excoriation on the pretibial area which appears to be healing.  Her heel fissure also appears to be healing with no evidence of fluctuance.  She has the area on the lateral aspect of her foot.  This is what she presented with and this appears better than my last  visit with her in the hospital.  She continues to have full-thickness loss which I suspect is down to the fifth metatarsal bone.  No surrounding erythema.    MEDICAL ISSUES: Patent right femoral endarterectomy and right femoral to anterior tibial bypass.  Concerning regarding erythema and her thigh with prosthetic graft  present.  We will communicate with the hemodialysis unit to treat her appropriately with IV antibiotics at the time of hemodialysis.  I will see her again in 2 weeks.  She will continue her wound management at the [facility] wound center   [name], MD [facility] Tel [phone]

## 2020-04-17 DIAGNOSIS — D631 Anemia in chronic kidney disease: Secondary | ICD-10-CM | POA: Diagnosis not present

## 2020-04-17 DIAGNOSIS — N2581 Secondary hyperparathyroidism of renal origin: Secondary | ICD-10-CM | POA: Diagnosis not present

## 2020-04-17 DIAGNOSIS — Z992 Dependence on renal dialysis: Secondary | ICD-10-CM | POA: Diagnosis not present

## 2020-04-17 DIAGNOSIS — T8149XA Infection following a procedure, other surgical site, initial encounter: Secondary | ICD-10-CM | POA: Diagnosis not present

## 2020-04-17 DIAGNOSIS — D509 Iron deficiency anemia, unspecified: Secondary | ICD-10-CM | POA: Diagnosis not present

## 2020-04-17 DIAGNOSIS — N186 End stage renal disease: Secondary | ICD-10-CM | POA: Diagnosis not present

## 2020-04-18 ENCOUNTER — Encounter (HOSPITAL_BASED_OUTPATIENT_CLINIC_OR_DEPARTMENT_OTHER): Payer: Medicare Other | Admitting: Physician Assistant

## 2020-04-18 ENCOUNTER — Other Ambulatory Visit: Payer: Self-pay

## 2020-04-18 DIAGNOSIS — L97516 Non-pressure chronic ulcer of other part of right foot with bone involvement without evidence of necrosis: Secondary | ICD-10-CM | POA: Diagnosis not present

## 2020-04-18 DIAGNOSIS — I872 Venous insufficiency (chronic) (peripheral): Secondary | ICD-10-CM | POA: Diagnosis not present

## 2020-04-18 DIAGNOSIS — I890 Lymphedema, not elsewhere classified: Secondary | ICD-10-CM | POA: Diagnosis not present

## 2020-04-18 DIAGNOSIS — L97512 Non-pressure chronic ulcer of 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d: Secondary | ICD-10-CM | POA: Diagnosis not present

## 2020-04-18 DIAGNOSIS — L97412 Non-pressure chronic ulcer of right heel and midfoot with fat layer exposed: Secondary | ICD-10-CM | POA: Diagnosis not present

## 2020-04-18 DIAGNOSIS — E11621 Type 2 diabetes mellitus with foot ulcer: Secondary | ICD-10-CM | POA: Diagnosis not present

## 2020-04-18 DIAGNOSIS — L97812 Non-pressure chronic ulcer of other part of right lower leg with fat layer exposed: Secondary | ICD-10-CM | POA: Diagnosis not present

## 2020-04-18 DIAGNOSIS — L97518 Non-pressure chronic ulcer of other part of right foot with other specified severity: Secondary | ICD-10-CM | POA: Diagnosis not present

## 2020-04-18 NOTE — Progress Notes (Addendum)
[name], [name] ([id number]) Visit Report for 04/18/2020 Chief Complaint Document Details Patient Name: Date of Service: [name], [place] NNA J. 04/18/2020 2:00 PM Medical Record Number: [id number] Patient Account Number: [id number] Date of Birth/Sex: Treating RN: 1946/01/29 (75 y.o. [name] Primary Care Provider: [name] Other Clinician: Referring Provider: Treating Provider/Extender: Azucena Kuba in Treatment: 11 Information Obtained from: Patient Chief Complaint Right plantar foot ulcer and Right LE Ulcers Electronic Signature(s) Signed: 04/18/2020 2:07:37 PM By: Worthy Keeler PA-C Entered By: Worthy Keeler on 04/18/2020 14:07:36 -------------------------------------------------------------------------------- Debridement Details Patient Name: Date of Service: Pamela Pilon, DO NNA J. 04/18/2020 2:00 PM Medical Record Number: [id number] Patient Account Number: [id number] Date of Birth/Sex: Treating RN: 05-Nov-1945 (75 y.o. [name] Primary Care Provider: [name] Other Clinician: Referring Provider: Treating Provider/Extender: Azucena Kuba in Treatment: 11 Debridement Performed for Assessment: Wound #18 Right,Proximal,Lateral Foot Performed By: Clinician Rhae Hammock, RN Debridement Type: Chemical/Enzymatic/Mechanical Agent Used: Santyl Severity of Tissue Pre Debridement: Bone involvement without necrosis Level of Consciousness (Pre-procedure): Awake and Alert Pre-procedure Verification/Time Out Yes - 14:30 Taken: Bleeding: None Response to Treatment: Procedure was tolerated well Level of Consciousness (Post- Awake and Alert procedure): Post Debridement Measurements of Total Wound Length: (cm) 2.9 Width: (cm) 1.4 Depth: (cm) 0.3 Volume: (cm) 0.957 Character of Wound/Ulcer Post Debridement: Requires Further Debridement Severity of Tissue Post Debridement: Bone involvement  without necrosis Post Procedure Diagnosis Same as Pre-procedure Electronic Signature(s) Signed: 04/18/2020 5:15:08 PM By: Worthy Keeler PA-C Signed: 04/18/2020 5:15:42 PM By: Baruch Gouty RN, BSN Entered By: Baruch Gouty on 04/18/2020 14:29:20 -------------------------------------------------------------------------------- Debridement Details Patient Name: Date of Service: Pamela Pilon, DO NNA J. 04/18/2020 2:00 PM Medical Record Number: [id number] Patient Account Number: [id number] Date of Birth/Sex: Treating RN: March 25, 1946 (75 y.o. [name] Primary Care Provider: [name] Other Clinician: Referring Provider: Treating Provider/Extender: Azucena Kuba in Treatment: 11 Debridement Performed for Assessment: Wound #21 Right Calcaneus Performed By: Clinician Rhae Hammock, RN Debridement Type: Chemical/Enzymatic/Mechanical Agent Used: Santyl Severity of Tissue Pre Debridement: Fat layer exposed Level of Consciousness (Pre-procedure): Awake and Alert Pre-procedure Verification/Time Out Yes - 14:30 Taken: Bleeding: None Response to Treatment: Procedure was tolerated well Level of Consciousness (Post- Awake and Alert procedure): Post Debridement Measurements of Total Wound Length: (cm) 0.6 Width: (cm) 0.8 Depth: (cm) 0.1 Volume: (cm) 0.038 Character of Wound/Ulcer Post Debridement: Requires Further Debridement Severity of Tissue Post Debridement: Fat layer exposed Post Procedure Diagnosis Same as Pre-procedure Electronic Signature(s) Signed: 04/18/2020 5:15:08 PM By: Worthy Keeler PA-C Signed: 04/18/2020 5:15:42 PM By: Baruch Gouty RN, BSN Entered By: Baruch Gouty on 04/18/2020 14:29:53 -------------------------------------------------------------------------------- HPI Details Patient Name: Date of Service: Pamela Pilon, DO NNA J. 04/18/2020 2:00 PM Medical Record Number: [id number] Patient Account Number: [id number] Date  of Birth/Sex: Treating RN: December 07, 1945 (75 y.o. [name] Primary Care Provider: [name] Other Clinician: Referring Provider: Treating Provider/Extender: Azucena Kuba in Treatment: 11 History of Present Illness HPI Description: 02/23/2019 on evaluation today patient presents for initial evaluation in our clinic concerning issues that she has been having with her bilateral lower extremities as well as her plantar foot on the right. This has been quite some time in regard to the foot ulcer even back as far as the beginning of this year that is 2020. She has had the wounds on her legs a much  shorter time but that still has been roughly 2-3 months. Nonetheless she is really not shown signs of a lot of improvement. She tells me that she does have a past medical history of diabetes, venous insufficiency, lymphedema which is mild but nonetheless present, peripheral vascular disease and she has previously been recommended to have a femoral-popliteal bypass but put that off at that point they are basically just monitoring at this time. She has end-stage renal disease with dependence on renal dialysis, hypertension, and obviously wounds of the bilateral lower extremities at this point. She tells me that she is having some discomfort but fortunately nothing too significant at this point which is good news. No fevers, chills, nausea, vomiting, or diarrhea. She is mainly been leaving these areas open to air as much as possible at this point at 1 point she was trying to keep them covered more but that really did not help either. Fortunately there is no signs of active systemic nor local infection at this time. 03/02/2019 on evaluation today patient appears to be doing well with regard to her wounds. The leg ulcers which were very dry are much more moist at this point and seem to be doing great at this time. Overall I am very pleased with how things seem to be  progressing. With regard to her foot ulcer it is showing some signs of slight improvement in my opinion overall there does not appear to be anything worse at least which is also good news. No fevers, chills, nausea, vomiting, or diarrhea. 03/09/2019 on evaluation today patient appears to be doing better with regard to her lower extremity ulcers a lot of the necrotic tissue is loosening up which is good news. In regard to her right plantar foot ulcer this appears to be roughly the same based on what I am seeing currently although there is less callus buildup. Unfortunately she does have an open wound area on the left lateral foot region which unfortunately probes down to bone once this was thoroughly examined. This is something she did not even know was opened she had had a Band-Aid over it just for protection last couple times I saw her. 03/16/19 evaluation today patient appears to be doing Someone poorly in regard to her left lower extremity. This seems to be likely infected based on what Im seeing at this point. Theres no signs of active infection at this time systemically but nonetheless theres a lot more erythema of the foot in general unfortunately. Her x-rays were reviewed and did not show any evidence of infection of the right foot. On the left there was a A little bit more going on but no obvious evidence of osteomyelitis though it was mentioned that if that was still a concern that number I would be the best way to further evaluate this. I think they were going to need to proceed with MRI to be honest based on what Im seeing today. 03/23/2019 on evaluation today patient actually appears to be doing worse compared to even last week's visit with regard to her wounds especially the left foot but even the right lower extremity. Both are measuring deeper as far as the overall depth of the wound on the left foot this also seems to have spread as far as the open wound location. It also does appear to  me that the cellulitis has spread further up her leg which also has me very concerned at this point. There fortunately is no signs of active infection at this  point systemically such as evidence of sepsis but nonetheless I am still concerned about the fact that again the patient does not seem to be turning around. We did have her on IV vancomycin through dialysis which they graciously help this with as well. With that being said it appears based on her culture results that I did review today that cefepime or something of the like would likely be a better option the vancomycin is not likely to help with the Serratia species that was identified. 03/28/19 Fem-Pop 04/06/19 Amp toe 47month follow-up with vascular in april 05/18/2019 on evaluation today patient presents for reevaluation here in the clinic after having been sent to the ER by myself December 2020 due to overall worsening of her condition. She did have a femoral-popliteal bypass performed on 03/28/2019. 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 Subsequently she does have a 54-month follow-up with vascular in April but seems to be doing well from a vascular standpoint at this time. Overall the main issue for which she comes in today she does have a area on the plantar aspect of her right foot where she does have a callus there may or may not be a wound under this region. Subsequently she does have several small ulcerations on the bilateral lower extremities that are more venous stasis in nature. Continue to these do need to be addressed today as well. Fortunately there is no signs of active infection at this time. 05/25/2019 upon evaluation today patient appears to be doing well in regard to her lower extremities. Fortunately there is no signs of active infection at this time. No fever chills noted. She has been tolerating the dressing changes without complication. Fortunately she is  doing much better in regard to her plantar foot in fact this appears to be completely healed which is excellent news. 06/01/2019 on evaluation today patient actually appears to be doing very well in regard to her wounds at this point. Fortunately there is no signs of active infection. No fevers, chills, nausea, vomiting, or diarrhea. 06/08/2019 upon evaluation today patient actually appears to be doing great in regard to her right lower extremity there are no open wounds remaining and she has done extremely well. Unfortunately she has several blistered areas that opened up on the left lower extremity in the interim between last week and this week. She has not been wearing any compression she states that I never told her anything about wearing compression. With that being said this is something that we have discussed in the past and it is something that we wanted her to do and again I did remind her of that today as well. 06/15/2019 upon evaluation today patient appears to be doing well with regard to her wound she does have some dressing material stuck to the surface of her wounds apparently they dried out and healed fairly quickly causing this to stick. Fortunately we should be able to clean this off but again that is one of the main issues that I see at this point. Fortunately there is no signs of active infection at this time. No fevers, chills, nausea, vomiting, or diarrhea. 06/22/2019 upon evaluation today patient appears to be doing very well in regard to her wounds. She has been tolerating the dressing changes without complication. Fortunately there is no signs of active infection at this time. No fevers, chills, nausea, vomiting, or diarrhea. In fact the right leg appears to be completely  healed she does have 1 pair of the compression socks with her 15-20 mmHg. Subsequently her left leg is doing much better although not completely healed she still has 1 area open at this point. 06/29/2019 on  evaluation today patient appears to be doing excellent in regard to her left lower extremity. There is no signs of active infection at this time the wounds appear to be completely healed and overall she is doing excellent. Readmission: 02/01/2020 upon evaluation today patient appears to be doing somewhat poorly in regard to her right foot ulcer locations. She has 3 spots that are problematic at this time. Unfortunately she has been seeing podiatry but they have not been able to get them to improve. She does have poor blood flow here and I do believe that she really needs to go back and see vascular for further evaluation and recommendations in this regard. I also think an x-ray would be ideal considering the fact that that according to the patient has not been done as of yet. I also believe Santyl will likely be beneficial for her. She has had a bypass surgery on her leg on the left they were holding off on the right unless it became an absolute necessity due to the fact that unfortunately she also has had a heart bypass which has been somewhat problematic for further intervention in the right lower extremity. 02/08/2020 upon evaluation today patient appears to be doing well with regard to her foot all things considered at each wound location. With that being said the x- ray was negative for any signs of obvious osteomyelitis. Nonetheless she is still awaiting the ultrasound and visit with Dr. Donnetta Hutching due to critical limb ischemia. Hopefully that will be coming up in the next week which the patient tells me is so. 02/22/2020 on evaluation today patient appears to be doing decently well just roughly the same in regard to her foot ulcers. She has seen Dr. Donnetta Hutching he has recommended an arteriogram that supposed to be scheduled for her but she tells me she has not heard anything from vascular about setting this up as of yet. Nonetheless I think the sooner that can be done the better as far as figuring out  exactly what were going is concerned. Fortunately there is no signs of active infection at this time which is great news. No fevers, chills, nausea, vomiting, or diarrhea. Patient's left leg showed to be noncompressible on her ABI testing her right leg showed low with an ABI of 0.65 and a TBI of 0.39. 04/11/2020 upon evaluation today patient appears to be doing well all things considered in regard to her blood flow. Since have last seen her she is actually undergone a couple of surgeries. This was to improve the blood flow in the bilateral lower extremities. Her first surgery was actually during her hospitalization between 03/16/2020 and 03/27/2020. This was following her right femoral anterior tibial bypass and external iliac endarterectomy. This procedure was performed on 03/16/2020. Subsequently she ended up also having a CFA endarterectomy and femoral-popliteal bypass on 03/28/2019 according to records. All things considered the testing that was performed on 03/19/2020 which was an arterial study shows that she did have excellent blood flow with an ABI of 1.08 on the right at that time. Obviously that is improvement compared to where things were previous. Nonetheless overall today I feel like the patient is going require some sharp debridement in regard to her right lateral foot there is definitely necrotic tendon exposed possibly necrotic  bone as well based on what I am palpating. 04/18/2020 upon evaluation today patient appears to be doing decently well in regard to her wounds in general. Everything on the leg has healed as well as the plantar foot. She does have still the lateral foot as well as the heel that is open. Santyl is being used at both these locations. The good news is her culture came back with a bacteria for which she is on antibiotics currently dialysis she does not know what that is but it is a very broad-spectrum antibiotic she tells me. Subsequently her pathology showed reforming  bone and nothing necrotic which is excellent and lastly the patient also seems to be doing well from the standpoint of what Dr. Donnetta Hutching sees at this point. He did have a consultation with her he was very pleased. Electronic Signature(s) Signed: 04/18/2020 2:37:20 PM By: Worthy Keeler PA-C Entered By: Worthy Keeler on 04/18/2020 14:37:20 -------------------------------------------------------------------------------- Physical Exam Details Patient Name: Date of Service: Pamela Pilon, DO NNA J. 04/18/2020 2:00 PM Medical Record Number: [id number] Patient Account Number: [id number] Date of Birth/Sex: Treating RN: 1946-01-31 (75 y.o. [name] Primary Care Provider: [name] Other Clinician: Referring Provider: Treating Provider/Extender: Azucena Kuba in Treatment: 13 Constitutional Well-nourished and well-hydrated in no acute distress. Respiratory normal breathing without difficulty. Psychiatric this patient is able to make decisions and demonstrates good insight into disease process. Alert and Oriented x 3. pleasant and cooperative. Notes Based on 1 obtained at this time the patient's wounds I think would benefit from continuing with the Santyl at both locations that are remaining on the heel and the right lateral foot. She is in agreement with that plan. Without being said I do believe that she needs to monitor for any signs of worsening infection she had a facility that we will be keeping an eye on this which is also good news. Electronic Signature(s) Signed: 04/18/2020 2:37:58 PM By: Worthy Keeler PA-C Entered By: Worthy Keeler on 04/18/2020 14:37:57 -------------------------------------------------------------------------------- Physician Orders Details Patient Name: Date of Service: Pamela Pilon, DO NNA J. 04/18/2020 2:00 PM Medical Record Number: [id number] Patient Account Number: [id number] Date of Birth/Sex: Treating RN: Aug 25, 1945 (75  y.o. [name], Linda Primary Care Provider: [name] Other Clinician: Referring Provider: Treating Provider/Extender: Azucena Kuba in Treatment: 11 Verbal / Phone Orders: No Diagnosis Coding ICD-10 Coding Code Description I87.2 Venous insufficiency (chronic) (peripheral) I89.0 Lymphedema, not elsewhere classified E11.621 Type 2 diabetes mellitus with foot ulcer I73.89 Other specified peripheral vascular diseases L97.512 Non-pressure chronic ulcer of 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d L97.518 Non-pressure chronic ulcer of other part of right foot with other specified severity L97.812 Non-pressure chronic ulcer of other part of right lower leg with fat layer exposed N18.6 End stage renal disease Z99.2 Dependence on renal dialysis I10 Essential (primary) hypertension Follow-up Appointments Return Appointment in 2 weeks. Bathing/ Shower/ Hygiene May shower with protection but do not get wound dressing(s) wet. Edema Control - Lymphedema / SCD / Other Bilateral Lower Extremities Elevate legs to the level of the heart or above for 30 minutes daily and/or when sitting, a frequency of: - throughout the day Avoid standing for long periods of time. Moisturize legs daily. Off-Loading Open toe surgical shoe to: - right foot to ambulate Other: - prevalon boot to right foot when in bed Wound Treatment Wound #18 - Foot Wound Laterality: Right, Lateral, Proximal  Cleanser: Normal Saline 1 x Per Day Discharge Instructions: Cleanse the wound with Normal Saline prior to applying a clean dressing using gauze sponges, not tissue or cotton balls. Peri-Wound Care: Sween Lotion (Moisturizing lotion) 1 x Per Day Discharge Instructions: Apply moisturizing lotion to foot with dressing changes Prim Dressing: Santyl Ointment 1 x Per Day ary Discharge Instructions: Apply nickel thick amount to wound bed as instructed Secondary Dressing: Woven Gauze  Sponge, Non-Sterile 4x4 in 1 x Per Day Discharge Instructions: Apply over primary dressing as directed. Secured With: Elastic Bandage 4 inch (ACE bandage) 1 x Per Day Discharge Instructions: Secure with ACE bandage as directed. Secured With: [employer], 4.5x3.1 (in/yd) 1 x Per Day Discharge Instructions: Secure with Kerlix as directed. Wound #21 - Calcaneus Wound Laterality: Right Cleanser: Normal Saline 1 x Per Day Discharge Instructions: Cleanse the wound with Normal Saline prior to applying a clean dressing using gauze sponges, not tissue or cotton balls. Prim Dressing: Santyl Ointment 1 x Per Day ary Discharge Instructions: Apply nickel thick amount to wound bed as instructed Secondary Dressing: Woven Gauze Sponge, Non-Sterile 4x4 in 1 x Per Day Discharge Instructions: Apply over primary dressing as directed. Secured With: Elastic Bandage 4 inch (ACE bandage) 1 x Per Day Discharge Instructions: Secure with ACE bandage as directed. Secured With: [employer], 4.5x3.1 (in/yd) 1 x Per Day Discharge Instructions: Secure with Kerlix as directed. Electronic Signature(s) Signed: 04/18/2020 5:15:08 PM By: Worthy Keeler PA-C Signed: 04/18/2020 5:15:42 PM By: Baruch Gouty RN, BSN Entered By: Baruch Gouty on 04/18/2020 14:35:59 -------------------------------------------------------------------------------- Problem List Details Patient Name: Date of Service: Pamela Pilon, DO NNA J. 04/18/2020 2:00 PM Medical Record Number: [id number] Patient Account Number: [id number] Date of Birth/Sex: Treating RN: Aug 09, 1945 (75 y.o. [name], Linda Primary Care Provider: [name] Other Clinician: Referring Provider: Treating Provider/Extender: Azucena Kuba in Treatment: 11 Active Problems ICD-10 Encounter Code Description Active Date MDM Diagnosis I87.2 Venous insufficiency (chronic) (peripheral) 02/01/2020 No Yes I89.0 Lymphedema, not  elsewhere classified 02/01/2020 No Yes E11.621 Type 2 diabetes mellitus with foot ulcer 02/01/2020 No Yes I73.89 Other specified peripheral vascular diseases 02/01/2020 No Yes L97.512 Non-pressure chronic ulcer of 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d 02/01/2020 No Yes L97.518 Non-pressure chronic ulcer of other part of right foot with other specified 02/01/2020 No Yes severity L97.812 Non-pressure chronic ulcer of other part of right lower leg with fat layer 04/11/2020 No Yes exposed N18.6 End stage renal disease 02/01/2020 No Yes Z99.2 Dependence on renal dialysis 02/01/2020 No Yes I10 Essential (primary) hypertension 02/01/2020 No Yes Inactive Problems Resolved Problems Electronic Signature(s) Signed: 04/18/2020 2:07:25 PM By: Worthy Keeler PA-C Entered By: Worthy Keeler on 04/18/2020 14:07:25 -------------------------------------------------------------------------------- Progress Note Details Patient Name: Date of Service: Pamela Pilon, DO NNA J. 04/18/2020 2:00 PM Medical Record Number: [id number] Patient Account Number: [id number] Date of Birth/Sex: Treating RN: January 14, 1946 (75 y.o. [name] Primary Care Provider: [name] Other Clinician: Referring Provider: Treating Provider/Extender: Azucena Kuba in Treatment: 11 Subjective Chief Complaint Information obtained from Patient Right plantar foot ulcer and Right LE Ulcers History of Present Illness (HPI) 02/23/2019 on evaluation today patient presents for initial evaluation in our clinic concerning issues that she has been having with her bilateral lower extremities as well as her plantar foot on the right. This has been quite some time in regard to the foot ulcer even back as far as  the beginning of this year that is 2020. She has had the wounds on her legs a much shorter time but that still has been roughly 2-3 months. Nonetheless she is really not shown signs of a lot  of improvement. She tells me that she does have a past medical history of diabetes, venous insufficiency, lymphedema which is mild but nonetheless present, peripheral vascular disease and she has previously been recommended to have a femoral-popliteal bypass but put that off at that point they are basically just monitoring at this time. She has end-stage renal disease with dependence on renal dialysis, hypertension, and obviously wounds of the bilateral lower extremities at this point. She tells me that she is having some discomfort but fortunately nothing too significant at this point which is good news. No fevers, chills, nausea, vomiting, or diarrhea. She is mainly been leaving these areas open to air as much as possible at this point at 1 point she was trying to keep them covered more but that really did not help either. Fortunately there is no signs of active systemic nor local infection at this time. 03/02/2019 on evaluation today patient appears to be doing well with regard to her wounds. The leg ulcers which were very dry are much more moist at this point and seem to be doing great at this time. Overall I am very pleased with how things seem to be progressing. With regard to her foot ulcer it is showing some signs of slight improvement in my opinion overall there does not appear to be anything worse at least which is also good news. No fevers, chills, nausea, vomiting, or diarrhea. 03/09/2019 on evaluation today patient appears to be doing better with regard to her lower extremity ulcers a lot of the necrotic tissue is loosening up which is good news. In regard to her right plantar foot ulcer this appears to be roughly the same based on what I am seeing currently although there is less callus buildup. Unfortunately she does have an open wound area on the left lateral foot region which unfortunately probes down to bone once this was thoroughly examined. This is something she did not even know was  opened she had had a Band-Aid over it just for protection last couple times I saw her. 03/16/19 evaluation today patient appears to be doing Someone poorly in regard to her left lower extremity. This seems to be likely infected based on what Ioom seeing at this point. Thereoos no signs of active infection at this time systemically but nonetheless thereoos a lot more erythema of the foot in general unfortunately. Her x-rays were reviewed and did not show any evidence of infection of the right foot. On the left there was a A little bit more going on but no obvious evidence of osteomyelitis though it was mentioned that if that was still a concern that number I would be the best way to further evaluate this. I think they were going to need to proceed with MRI to be honest based on what Ioom seeing today. 03/23/2019 on evaluation today patient actually appears to be doing worse compared to even last week's visit with regard to her wounds especially the left foot but even the right lower extremity. Both are measuring deeper as far as the overall depth of the wound on the left foot this also seems to have spread as far as the open wound location. It also does appear to me that the cellulitis has spread further up her leg which  also has me very concerned at this point. There fortunately is no signs of active infection at this point systemically such as evidence of sepsis but nonetheless I am still concerned about the fact that again the patient does not seem to be turning around. We did have her on IV vancomycin through dialysis which they graciously help this with as well. With that being said it appears based on her culture results that I did review today that cefepime or something of the like would likely be a better option the vancomycin is not likely to help with the Serratia species that was identified. 03/28/19 Fem-Pop 04/06/19 Amp toe 59month follow-up with vascular in april 05/18/2019 on evaluation  today patient presents for reevaluation here in the clinic after having been sent to the ER by myself December 2020 due to overall worsening of her condition. She did have a femoral-popliteal bypass performed on 03/28/2019. 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 Subsequently she does have a 21-month follow-up with vascular in April but seems to be doing well from a vascular standpoint at this time. Overall the main issue for which she comes in today she does have a area on the plantar aspect of her right foot where she does have a callus there may or may not be a wound under this region. Subsequently she does have several small ulcerations on the bilateral lower extremities that are more venous stasis in nature. Continue to these do need to be addressed today as well. Fortunately there is no signs of active infection at this time. 05/25/2019 upon evaluation today patient appears to be doing well in regard to her lower extremities. Fortunately there is no signs of active infection at this time. No fever chills noted. She has been tolerating the dressing changes without complication. Fortunately she is doing much better in regard to her plantar foot in fact this appears to be completely healed which is excellent news. 06/01/2019 on evaluation today patient actually appears to be doing very well in regard to her wounds at this point. Fortunately there is no signs of active infection. No fevers, chills, nausea, vomiting, or diarrhea. 06/08/2019 upon evaluation today patient actually appears to be doing great in regard to her right lower extremity there are no open wounds remaining and she has done extremely well. Unfortunately she has several blistered areas that opened up on the left lower extremity in the interim between last week and this week. She has not been wearing any compression she states that I never told her anything about wearing  compression. With that being said this is something that we have discussed in the past and it is something that we wanted her to do and again I did remind her of that today as well. 06/15/2019 upon evaluation today patient appears to be doing well with regard to her wound she does have some dressing material stuck to the surface of her wounds apparently they dried out and healed fairly quickly causing this to stick. Fortunately we should be able to clean this off but again that is one of the main issues that I see at this point. Fortunately there is no signs of active infection at this time. No fevers, chills, nausea, vomiting, or diarrhea. 06/22/2019 upon evaluation today patient appears to be doing very well in regard to her wounds. She has been tolerating the dressing changes without complication. Fortunately there is no signs of active infection  at this time. No fevers, chills, nausea, vomiting, or diarrhea. In fact the right leg appears to be completely healed she does have 1 pair of the compression socks with her 15-20 mmHg. Subsequently her left leg is doing much better although not completely healed she still has 1 area open at this point. 06/29/2019 on evaluation today patient appears to be doing excellent in regard to her left lower extremity. There is no signs of active infection at this time the wounds appear to be completely healed and overall she is doing excellent. Readmission: 02/01/2020 upon evaluation today patient appears to be doing somewhat poorly in regard to her right foot ulcer locations. She has 3 spots that are problematic at this time. Unfortunately she has been seeing podiatry but they have not been able to get them to improve. She does have poor blood flow here and I do believe that she really needs to go back and see vascular for further evaluation and recommendations in this regard. I also think an x-ray would be ideal considering the fact that that according to the patient  has not been done as of yet. I also believe Santyl will likely be beneficial for her. She has had a bypass surgery on her leg on the left they were holding off on the right unless it became an absolute necessity due to the fact that unfortunately she also has had a heart bypass which has been somewhat problematic for further intervention in the right lower extremity. 02/08/2020 upon evaluation today patient appears to be doing well with regard to her foot all things considered at each wound location. With that being said the x- ray was negative for any signs of obvious osteomyelitis. Nonetheless she is still awaiting the ultrasound and visit with Dr. Donnetta Hutching due to critical limb ischemia. Hopefully that will be coming up in the next week which the patient tells me is so. 02/22/2020 on evaluation today patient appears to be doing decently well just roughly the same in regard to her foot ulcers. She has seen Dr. Donnetta Hutching he has recommended an arteriogram that supposed to be scheduled for her but she tells me she has not heard anything from vascular about setting this up as of yet. Nonetheless I think the sooner that can be done the better as far as figuring out exactly what were going is concerned. Fortunately there is no signs of active infection at this time which is great news. No fevers, chills, nausea, vomiting, or diarrhea. Patient's left leg showed to be noncompressible on her ABI testing her right leg showed low with an ABI of 0.65 and a TBI of 0.39. 04/11/2020 upon evaluation today patient appears to be doing well all things considered in regard to her blood flow. Since have last seen her she is actually undergone a couple of surgeries. This was to improve the blood flow in the bilateral lower extremities. Her first surgery was actually during her hospitalization between 03/16/2020 and 03/27/2020. This was following her right femoral anterior tibial bypass and external iliac endarterectomy. This  procedure was performed on 03/16/2020. Subsequently she ended up also having a CFA endarterectomy and femoral-popliteal bypass on 03/28/2019 according to records. All things considered the testing that was performed on 03/19/2020 which was an arterial study shows that she did have excellent blood flow with an ABI of 1.08 on the right at that time. Obviously that is improvement compared to where things were previous. Nonetheless overall today I feel like the patient is going  require some sharp debridement in regard to her right lateral foot there is definitely necrotic tendon exposed possibly necrotic bone as well based on what I am palpating. 04/18/2020 upon evaluation today patient appears to be doing decently well in regard to her wounds in general. Everything on the leg has healed as well as the plantar foot. She does have still the lateral foot as well as the heel that is open. Santyl is being used at both these locations. The good news is her culture came back with a bacteria for which she is on antibiotics currently dialysis she does not know what that is but it is a very broad-spectrum antibiotic she tells me. Subsequently her pathology showed reforming bone and nothing necrotic which is excellent and lastly the patient also seems to be doing well from the standpoint of what Dr. Donnetta Hutching sees at this point. He did have a consultation with her he was very pleased. Objective Constitutional Well-nourished and well-hydrated in no acute distress. Vitals Time Taken: 1:40 PM, Height: 64 in, Weight: 150 lbs, BMI: 25.7, Temperature: 98.3 F, Pulse: 91 bpm, Respiratory Rate: 15 breaths/min, Blood Pressure: 125/72 mmHg. Respiratory normal breathing without difficulty. Psychiatric this patient is able to make decisions and demonstrates good insight into disease process. Alert and Oriented x 3. pleasant and cooperative. General Notes: Based on 1 obtained at this time the patient's wounds I think would  benefit from continuing with the Santyl at both locations that are remaining on the heel and the right lateral foot. She is in agreement with that plan. Without being said I do believe that she needs to monitor for any signs of worsening infection she had a facility that we will be keeping an eye on this which is also good news. Integumentary (Hair, Skin) Wound #17 status is Healed - Epithelialized. Original cause of wound was Gradually Appeared. The wound is located on the [place]. The wound measures 0cm length x 0cm width x 0cm depth; 0cm^2 area and 0cm^3 volume. There is no tunneling or undermining noted. There is a none present amount of drainage noted. The wound margin is thickened. There is no granulation within the wound bed. There is no necrotic tissue within the wound bed. Wound #18 status is Open. Original cause of wound was Gradually Appeared. The wound is located on the Right,Proximal,Lateral Foot. The wound measures 2.9cm length x 1.4cm width x 0.3cm depth; 3.189cm^2 area and 0.957cm^3 volume. There is tendon and Fat Layer (Subcutaneous Tissue) exposed. There is no tunneling noted, however, there is undermining starting at 1:00 and ending at 5:00 with a maximum distance of 0.8cm. There is a medium amount of serosanguineous drainage noted. The wound margin is epibole. There is small (1-33%) pink granulation within the wound bed. There is a large (67-100%) amount of necrotic tissue within the wound bed including Adherent Slough. Wound #19 status is Open. Original cause of wound was Blister. The wound is located on the Right,Anterior Lower Leg. The wound measures 0cm length x 0cm width x 0cm depth; 0cm^2 area and 0cm^3 volume. Wound #20 status is Healed - Epithelialized. Original cause of wound was Blister. The wound is located on the Right,Medial Lower Leg. Wound #21 status is Open. Original cause of wound was Pressure Injury. The wound is located on the Right Calcaneus. The  wound measures 0.6cm length x 0.8cm width x 0.1cm depth; 0.377cm^2 area and 0.038cm^3 volume. There is Fat Layer (Subcutaneous Tissue) exposed. There is no tunneling or undermining noted. There is  a medium amount of serous drainage noted. The wound margin is flat and intact. There is medium (34-66%) pink granulation within the wound bed. There is a small (1-33%) amount of necrotic tissue within the wound bed including Adherent Slough. Assessment Active Problems ICD-10 Venous insufficiency (chronic) (peripheral) Lymphedema, not elsewhere classified Type 2 diabetes mellitus with foot ulcer Other specified peripheral vascular diseases Non-pressure chronic ulcer of 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d Non-pressure chronic ulcer of other part of right foot with other specified severity Non-pressure chronic ulcer of other part of right lower leg with fat layer exposed End stage renal disease Dependence on renal dialysis Essential (primary) hypertension Procedures Wound #18 Pre-procedure diagnosis of Wound #18 is a Diabetic Wound/Ulcer of the Lower Extremity located on the Right,Proximal,Lateral Foot .Severity of Tissue Pre Debridement is: Bone involvement without necrosis. There was a Chemical/Enzymatic/Mechanical debridement performed by Rhae Hammock, RN.Marland Kitchen Agent used was [employer]. A time out was conducted at 14:30, prior to the start of the procedure. There was no bleeding. The procedure was tolerated well. Post Debridement Measurements: 2.9cm length x 1.4cm width x 0.3cm depth; 0.957cm^3 volume. Character of Wound/Ulcer Post Debridement requires further debridement. Severity of Tissue Post Debridement is: Bone involvement without necrosis. Post procedure Diagnosis Wound #18: Same as Pre-Procedure Wound #21 Pre-procedure diagnosis of Wound #21 is a Diabetic Wound/Ulcer of the Lower Extremity located on the Right Calcaneus .Severity of Tissue Pre Debridement is: Fat layer exposed.  There was a Chemical/Enzymatic/Mechanical debridement performed by Rhae Hammock, RN.Marland Kitchen Agent used was [employer]. A time out was conducted at 14:30, prior to the start of the procedure. There was no bleeding. The procedure was tolerated well. Post Debridement Measurements: 0.6cm length x 0.8cm width x 0.1cm depth; 0.038cm^3 volume. Character of Wound/Ulcer Post Debridement requires further debridement. Severity of Tissue Post Debridement is: Fat layer exposed. Post procedure Diagnosis Wound #21: Same as Pre-Procedure Plan Follow-up Appointments: Return Appointment in 2 weeks. Bathing/ Shower/ Hygiene: May shower with protection but do not get wound dressing(s) wet. Edema Control - Lymphedema / SCD / Other: Elevate legs to the level of the heart or above for 30 minutes daily and/or when sitting, a frequency of: - throughout the day Avoid standing for long periods of time. Moisturize legs daily. Off-Loading: Open toe surgical shoe to: - right foot to ambulate Other: - prevalon boot to right foot when in bed WOUND #18: - Foot Wound Laterality: Right, Lateral, Proximal Cleanser: Normal Saline 1 x Per Day/ Discharge Instructions: Cleanse the wound with Normal Saline prior to applying a clean dressing using gauze sponges, not tissue or cotton balls. Peri-Wound Care: Sween Lotion (Moisturizing lotion) 1 x Per Day/ Discharge Instructions: Apply moisturizing lotion to foot with dressing changes Prim Dressing: Santyl Ointment 1 x Per Day/ ary Discharge Instructions: Apply nickel thick amount to wound bed as instructed Secondary Dressing: Woven Gauze Sponge, Non-Sterile 4x4 in 1 x Per Day/ Discharge Instructions: Apply over primary dressing as directed. Secured With: Elastic Bandage 4 inch (ACE bandage) 1 x Per Day/ Discharge Instructions: Secure with ACE bandage as directed. Secured With: [employer], 4.5x3.1 (in/yd) 1 x Per Day/ Discharge Instructions: Secure with Kerlix as  directed. WOUND #21: - Calcaneus Wound Laterality: Right Cleanser: Normal Saline 1 x Per Day/ Discharge Instructions: Cleanse the wound with Normal Saline prior to applying a clean dressing using gauze sponges, not tissue or cotton balls. Prim Dressing: Santyl Ointment 1 x Per Day/ ary Discharge Instructions: Apply nickel thick amount  to wound bed as instructed Secondary Dressing: Woven Gauze Sponge, Non-Sterile 4x4 in 1 x Per Day/ Discharge Instructions: Apply over primary dressing as directed. Secured With: Elastic Bandage 4 inch (ACE bandage) 1 x Per Day/ Discharge Instructions: Secure with ACE bandage as directed. Secured With: [employer], 4.5x3.1 (in/yd) 1 x Per Day/ Discharge Instructions: Secure with Kerlix as directed. 1. Would recommend currently we use Santyl for the remaining 2 wound locations and the patient is in agreement with the plan. 2. I am also can recommend this to be changed daily also would suggest a Prevalon offloading boot to try to prevent pressure to the foot and heel region. 3. I would also recommend that the patient continue to do as much as possible elevate her leg and we are using an Ace bandage to try to help with edema control currently and hold everything in place as well. We will see patient back for reevaluation in 2 weeks here in the clinic. If anything worsens or changes patient will contact our office for additional recommendations. The patient is currently on IV antibiotics at dialysis she is not exactly sure what they are but nonetheless she tells me that it is "broad- spectrum". Electronic Signature(s) Signed: 04/18/2020 2:38:51 PM By: Worthy Keeler PA-C Previous Signature: 04/18/2020 2:38:31 PM Version By: Worthy Keeler PA-C Entered By: Worthy Keeler on 04/18/2020 14:38:51 -------------------------------------------------------------------------------- SuperBill Details Patient Name: Date of Service: Pamela Pilon, DO NNA J.  04/18/2020 Medical Record Number: [id number] Patient Account Number: [id number] Date of Birth/Sex: Treating RN: 1945-09-05 (75 y.o. [name], Linda Primary Care Provider: [name] Other Clinician: Referring Provider: Treating Provider/Extender: Azucena Kuba in Treatment: 11 Diagnosis Coding ICD-10 Codes Code Description I87.2 Venous insufficiency (chronic) (peripheral) I89.0 Lymphedema, not elsewhere classified E11.621 Type 2 diabetes mellitus with foot ulcer I73.89 Other specified peripheral vascular diseases L97.512 Non-pressure chronic ulcer of 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d L97.518 Non-pressure chronic ulcer of other part of right foot with other specified severity L97.812 Non-pressure chronic ulcer of other part of right lower leg with fat layer exposed N18.6 End stage renal disease Z99.2 Dependence on renal dialysis I10 Essential (primary) hypertension Facility Procedures CPT4 Code: [id number] Description: [phone] - [place] W/O ANES NON SELECT Modifier: Quantity: 1 Physician Procedures : CPT4 Code Description Modifier [id number] - WC PHYS LEVEL 3 - EST PT ICD-10 Diagnosis Description I87.2 Venous insufficiency (chronic) (peripheral) I89.0 Lymphedema, not elsewhere classified E11.621 Type 2 diabetes mellitus with foot ulcer I73.89  Other specified peripheral vascular diseases Quantity: 1 Electronic Signature(s) Signed: 04/18/2020 2:39:04 PM By: Worthy Keeler PA-C Entered By: Worthy Keeler on 04/18/2020 14:39:03

## 2020-04-18 NOTE — Progress Notes (Signed)
[name], [name] ([id number]) Visit Report for 04/18/2020 Arrival Information Details Patient Name: Date of Service: [name], Pamela NNA J. 04/18/2020 2:00 PM Medical Record Number: [id number] Patient Account Number: [id number] Date of Birth/Sex: Treating RN: 1945/04/26 (75 y.o. [name], [name] Primary Care [name]: [name] Other Clinician: Referring [name]: Treating [name]/Extender: [name] in Treatment: 11 Visit Information History Since Last Visit Added or deleted any medications: No Patient Arrived: Wheel Chair Any new allergies or adverse reactions: No Arrival Time: 13:30 Had a fall or experienced change in No Accompanied By: self activities of daily living that may affect Transfer Assistance: Manual risk of falls: Patient Identification Verified: Yes Signs or symptoms of abuse/neglect since last visito No Secondary Verification Process Completed: Yes Hospitalized since last visit: No Patient Requires Transmission-Based Precautions: No Implantable device outside of the clinic excluding No Patient Has Alerts: Yes cellular tissue based products placed in the center Patient Alerts: R ABI = 1.08 since last visit: L ABI = [name] TBI = 0.79 Pain Present Now: No 03/2020 Electronic Signature(s) Signed: 04/18/2020 4:12:15 PM By: [name] EMT/HBOT/SD Entered By: [name] on 04/18/2020 13:49:48 -------------------------------------------------------------------------------- Encounter Discharge Information Details Patient Name: Date of Service: Pamela Pilon, DO NNA J. 04/18/2020 2:00 PM Medical Record Number: [id number] Patient Account Number: [id number] Date of Birth/Sex: Treating RN: 10/13/1945 (75 y.o. [name], [name] Primary Care [name]: [name] Other Clinician: Referring [name]: Treating [name]/Extender: [name] in Treatment: 11 Encounter Discharge Information Items Post  Procedure Vitals Discharge Condition: Stable Temperature (F): 98.3 Ambulatory Status: Wheelchair Pulse (bpm): 94 Discharge Destination: Home Respiratory Rate (breaths/min): 17 Transportation: Private Auto Blood Pressure (mmHg): 125/74 Accompanied By: self Schedule Follow-up Appointment: Yes Clinical Summary of Care: Patient Declined Electronic Signature(s) Signed: 04/18/2020 5:04:31 PM By: Pamela Hammock RN Entered By: [name] on 04/18/2020 14:45:23 -------------------------------------------------------------------------------- Lower Extremity Assessment Details Patient Name: Date of Service: Pamela Pilon, DO NNA J. 04/18/2020 2:00 PM Medical Record Number: [id number] Patient Account Number: [id number] Date of Birth/Sex: Treating RN: 08/20/1945 (75 y.o. [name] Primary Care Azarria Balint: [name] Other Clinician: Referring [name]: Treating [name]/Extender: [name] in Treatment: 11 Edema Assessment Assessed: [Left: No] [name]: Yes] Edema: [Left: N] [Right: o] Calf Left: Right: Point of Measurement: 43 cm From Medial Instep 33 cm Ankle Left: Right: Point of Measurement: 11 cm From Medial Instep 20 cm Vascular Assessment Pulses: Dorsalis Pedis Palpable: [Right:Yes] Electronic Signature(s) Signed: 04/18/2020 4:12:15 PM By: [name] EMT/HBOT/SD Signed: 04/18/2020 5:15:42 PM By: Pamela Gouty RN, BSN Entered By: [name] on 04/18/2020 13:52:05 -------------------------------------------------------------------------------- Multi-Disciplinary Care Plan Details Patient Name: Date of Service: Pamela Pilon, DO NNA J. 04/18/2020 2:00 PM Medical Record Number: [id number] Patient Account Number: [id number] Date of Birth/Sex: Treating RN: 1945-07-13 (75 y.o. [name] Primary Care Maloree Uplinger: [name] Other Clinician: Referring Meshach Perry: Treating [name]/Extender: [name] in Treatment: 11 Active Inactive Wound/Skin Impairment Nursing Diagnoses: Impaired tissue integrity Knowledge deficit related to ulceration/compromised skin integrity Goals: Patient/caregiver will verbalize understanding of skin care regimen Date Initiated: 02/08/2020 Target Resolution Date: 05/11/2020 Goal Status: Active Ulcer/skin breakdown will have a volume reduction of 30% by week 4 Date Initiated: 02/08/2020 Date Inactivated: 04/11/2020 Target Resolution Date: 03/07/2020 Goal Status: Met Interventions: Assess patient/caregiver ability to obtain necessary supplies Assess patient/caregiver ability to perform ulcer/skin care regimen upon admission and as needed Assess ulceration(s) every visit Provide  education on ulcer and skin care Treatment Activities: Skin care regimen initiated : 02/08/2020 Topical wound management initiated : 02/08/2020 Notes: Electronic Signature(s) Signed: 04/18/2020 5:15:42 PM By: Pamela Gouty RN, BSN Entered By: [name] on 04/18/2020 14:34:25 -------------------------------------------------------------------------------- Pain Assessment Details Patient Name: Date of Service: Pamela Pilon, DO NNA J. 04/18/2020 2:00 PM Medical Record Number: [id number] Patient Account Number: [id number] Date of Birth/Sex: Treating RN: 24-Sep-1945 (75 y.o. [name] Primary Care Destry Dauber: [name] Other Clinician: Referring [name]: Treating [name]/Extender: [name] in Treatment: 11 Active Problems Location of Pain Severity and Description of Pain Patient Has Paino No Site Locations With Dressing Change: No Pain Management and Medication Current Pain Management: Electronic Signature(s) Signed: 04/18/2020 4:12:15 PM By: [name] EMT/HBOT/SD Signed: 04/18/2020 5:15:42 PM By: Pamela Gouty RN, BSN Entered By: [name] on 04/18/2020  13:50:42 -------------------------------------------------------------------------------- Patient/Caregiver Education Details Patient Name: Date of Service: Pamela Pilon, DO NNA J. 1/12/2022andnbsp2:00 PM Medical Record Number: [id number] Patient Account Number: [id number] Date of Birth/Gender: Treating RN: [birth year] (75 y.o. [name] Primary Care Physician: [name] Other Clinician: Referring Physician: Treating Physician/Extender: [name] in Treatment: 11 Education Assessment Education Provided To: Patient Education Topics Provided Wound/Skin Impairment: Methods: Explain/Verbal Responses: Reinforcements needed, State content correctly Electronic Signature(s) Signed: 04/18/2020 5:15:42 PM By: Pamela Gouty RN, BSN Entered By: [name] on 04/18/2020 14:34:54 -------------------------------------------------------------------------------- Wound Assessment Details Patient Name: Date of Service: Pamela Pilon, DO NNA J. 04/18/2020 2:00 PM Medical Record Number: [id number] Patient Account Number: [id number] Date of Birth/Sex: Treating RN: September 27, 1945 (75 y.o. [name], [name] Primary Care Andrzej Scully: [name] Other Clinician: Referring Jasslyn Finkel: Treating Joscelyne Renville/Extender: [name] in Treatment: 11 Wound Status Wound Number: 17 Primary Diabetic Wound/Ulcer of the Lower Extremity Etiology: Wound Location: Right, Plantar Foot Wound Healed - Epithelialized Wounding Event: Gradually Appeared Status: Date Acquired: 11/06/2018 Comorbid Cataracts, Anemia, Asthma, Congestive Heart Failure, Coronary Weeks Of Treatment: 11 History: Artery Disease, Hypertension, Peripheral Arterial Disease, Clustered Wound: No Peripheral Venous Disease, Type II Diabetes, End Stage Renal Disease, Osteoarthritis, Osteomyelitis, Neuropathy, Received Radiation Wound Measurements Length: (cm) Width:  (cm) Depth: (cm) Area: (cm) Volume: (cm) 0 % Reduction in Area: 100% 0 % Reduction in Volume: 100% 0 Epithelialization: Large (67-100%) 0 Tunneling: No 0 Undermining: No Wound Description Classification: Grade 2 Wound Margin: Thickened Exudate Amount: None Present Foul Odor After Cleansing: No Slough/Fibrino No Wound Bed Granulation Amount: None Present (0%) Exposed Structure Necrotic Amount: None Present (0%) Fascia Exposed: No Fat Layer (Subcutaneous Tissue) Exposed: No Tendon Exposed: No Muscle Exposed: No Joint Exposed: No Bone Exposed: No Treatment Notes Wound #17 (Foot) Wound Laterality: Plantar, Right Cleanser Peri-Wound Care Topical Primary Dressing Secondary Dressing Secured With Compression Wrap Compression Stockings Add-Ons Electronic Signature(s) Signed: 04/18/2020 5:15:42 PM By: Pamela Gouty RN, BSN Entered By: [name] on 04/18/2020 14:27:06 -------------------------------------------------------------------------------- Wound Assessment Details Patient Name: Date of Service: Pamela Pilon, DO NNA J. 04/18/2020 2:00 PM Medical Record Number: [id number] Patient Account Number: [id number] Date of Birth/Sex: Treating RN: [birth year] (75 y.o. [name] Primary Care Brenden Rudman: [name] Other Clinician: Referring Aydee Mcnew: Treating Xzavior Reinig/Extender: [name] in Treatment: 11 Wound Status Wound Number: 18 Primary Diabetic Wound/Ulcer of the Lower Extremity Etiology: Wound Location: Right, Proximal, Lateral Foot Wound Open Wounding Event: Gradually Appeared Status: Date Acquired: 11/06/2018 Comorbid Cataracts, Anemia, Asthma, Congestive Heart Failure, Coronary Weeks Of Treatment: 11  History: Artery Disease, Hypertension, Peripheral Arterial Disease, Clustered Wound: No Peripheral Venous Disease, Type II Diabetes, End Stage Renal Disease, Osteoarthritis, Osteomyelitis, Neuropathy,  Received Radiation Wound Measurements Length: (cm) 2.9 Width: (cm) 1.4 Depth: (cm) 0.3 Area: (cm) 3.189 Volume: (cm) 0.957 % Reduction in Area: 37% % Reduction in Volume: 36.9% Epithelialization: Small (1-33%) Tunneling: No Undermining: Yes Starting Position (o'clock): 1 Ending Position (o'clock): 5 Maximum Distance: (cm) 0.8 Wound Description Classification: Grade 2 Wound Margin: Epibole Exudate Amount: Medium Exudate Type: Serosanguineous Exudate Color: red, brown Foul Odor After Cleansing: No Slough/Fibrino Yes Wound Bed Granulation Amount: Small (1-33%) Exposed Structure Granulation Quality: Pink Fascia Exposed: No Necrotic Amount: Large (67-100%) Fat Layer (Subcutaneous Tissue) Exposed: Yes Necrotic Quality: Adherent Slough Tendon Exposed: Yes Muscle Exposed: No Joint Exposed: No Bone Exposed: No Treatment Notes Wound #18 (Foot) Wound Laterality: Right, Lateral, Proximal Cleanser Normal Saline Discharge Instruction: Cleanse the wound with Normal Saline prior to applying a clean dressing using gauze sponges, not tissue or cotton balls. Peri-Wound Care Sween Lotion (Moisturizing lotion) Discharge Instruction: Apply moisturizing lotion to foot with dressing changes Topical Primary Dressing Santyl Ointment Discharge Instruction: Apply nickel thick amount to wound bed as instructed Secondary Dressing Woven Gauze Sponge, Non-Sterile 4x4 in Discharge Instruction: Apply over primary dressing as directed. Secured With Elastic Bandage 4 inch (ACE bandage) Discharge Instruction: Secure with ACE bandage as directed. Kerlix Roll Sterile, 4.5x3.1 (in/yd) Discharge Instruction: Secure with Kerlix as directed. Compression Wrap Compression Stockings Add-Ons Electronic Signature(s) Signed: 04/18/2020 4:12:15 PM By: [name] EMT/HBOT/SD Signed: 04/18/2020 5:15:42 PM By: Pamela Gouty RN, BSN Entered By: [name] on 04/18/2020  14:08:34 -------------------------------------------------------------------------------- Wound Assessment Details Patient Name: Date of Service: Pamela Pilon, DO NNA J. 04/18/2020 2:00 PM Medical Record Number: [id number] Patient Account Number: [id number] Date of Birth/Sex: Treating RN: 1946-01-11 (75 y.o. [name] Primary Care Dail Lerew: [name] Other Clinician: Referring Galina Haddox: Treating Khadeeja Elden/Extender: [name] in Treatment: 11 Wound Status Wound Number: 19 Primary Etiology: Venous Leg Ulcer Wound Location: Right, Anterior Lower Leg Secondary Etiology: Diabetic Wound/Ulcer of the Lower Extremity Wounding Event: Blister Wound Status: Open Date Acquired: 03/27/2020 Weeks Of Treatment: 1 Clustered Wound: Yes Wound Measurements Length: (cm) Width: (cm) Depth: (cm) Area: (cm) Volume: (cm) Wound Description Classification: Full Thickness Without Exposed Support Structur es 0 % Reduction in Area: 100% 0 % Reduction in Volume: 100% 0 0 0 Electronic Signature(s) Signed: 04/18/2020 4:12:15 PM By: [name] EMT/HBOT/SD Signed: 04/18/2020 5:15:42 PM By: Pamela Gouty RN, BSN Entered By: [name] on 04/18/2020 14:08:48 -------------------------------------------------------------------------------- Wound Assessment Details Patient Name: Date of Service: Pamela Pilon, DO NNA J. 04/18/2020 2:00 PM Medical Record Number: [id number] Patient Account Number: [id number] Date of Birth/Sex: Treating RN: 1945/07/05 (75 y.o. [name] Primary Care Leopold Smyers: [name] Other Clinician: Referring Cowen Pesqueira: Treating Jakota Manthei/Extender: [name] in Treatment: 11 Wound Status Wound Number: 20 Primary Etiology: Venous Leg Ulcer Wound Location: Right, Medial Lower Leg Secondary Etiology: Diabetic Wound/Ulcer of the Lower Extremity Wounding Event: Blister Wound Status: Healed  - Epithelialized Date Acquired: 03/27/2020 Weeks Of Treatment: 1 Clustered Wound: No Wound Description Classification: Full Thickness Without Exposed Support Structur es Treatment Notes Wound #20 (Lower Leg) Wound Laterality: Right, Medial Cleanser Peri-Wound Care Topical Primary Dressing Secondary Dressing Secured With Compression Wrap Compression Stockings Add-Ons Electronic Signature(s) Signed: 04/18/2020 4:12:15 PM By: [name] EMT/HBOT/SD Signed: 04/18/2020 5:15:42 PM By: Pamela Gouty RN, BSN Entered By: [name]  on 04/18/2020 14:11:17 -------------------------------------------------------------------------------- Wound Assessment Details Patient Name: Date of Service: [name], Pamela NNA J. 04/18/2020 2:00 PM Medical Record Number: [id number] Patient Account Number: [id number] Date of Birth/Sex: Treating RN: 09-29-1945 (75 y.o. [name], [name] Primary Care Kysen Wetherington: [name] Other Clinician: Referring Adolph Clutter: Treating Alleya Demeter/Extender: [name] in Treatment: 11 Wound Status Wound Number: 21 Primary Diabetic Wound/Ulcer of the Lower Extremity Etiology: Wound Location: Right Calcaneus Secondary Pressure Ulcer Wounding Event: Pressure Injury Etiology: Date Acquired: 04/11/2020 Wound Open Weeks Of Treatment: 1 Status: Clustered Wound: No Comorbid Cataracts, Anemia, Asthma, Congestive Heart Failure, Coronary History: Artery Disease, Hypertension, Peripheral Arterial Disease, Peripheral Venous Disease, Type II Diabetes, End Stage Renal Disease, Osteoarthritis, Osteomyelitis, Neuropathy, Received Radiation Wound Measurements Length: (cm) 0.6 Width: (cm) 0.8 Depth: (cm) 0.1 Area: (cm) 0.377 Volume: (cm) 0.038 % Reduction in Area: 68% % Reduction in Volume: 67.8% Epithelialization: None Tunneling: No Undermining: No Wound Description Classification: Unable to visualize wound bed Wound Margin: Flat  and Intact Exudate Amount: Medium Exudate Type: Serous Exudate Color: amber Foul Odor After Cleansing: No Slough/Fibrino Yes Wound Bed Granulation Amount: Medium (34-66%) Exposed Structure Granulation Quality: Pink Fascia Exposed: No Necrotic Amount: Small (1-33%) Fat Layer (Subcutaneous Tissue) Exposed: Yes Necrotic Quality: Adherent Slough Tendon Exposed: No Muscle Exposed: No Joint Exposed: No Bone Exposed: No Treatment Notes Wound #21 (Calcaneus) Wound Laterality: Right Cleanser Normal Saline Discharge Instruction: Cleanse the wound with Normal Saline prior to applying a clean dressing using gauze sponges, not tissue or cotton balls. Peri-Wound Care Topical Primary Dressing Santyl Ointment Discharge Instruction: Apply nickel thick amount to wound bed as instructed Secondary Dressing Woven Gauze Sponge, Non-Sterile 4x4 in Discharge Instruction: Apply over primary dressing as directed. Secured With Elastic Bandage 4 inch (ACE bandage) Discharge Instruction: Secure with ACE bandage as directed. Kerlix Roll Sterile, 4.5x3.1 (in/yd) Discharge Instruction: Secure with Kerlix as directed. Compression Wrap Compression Stockings Add-Ons Electronic Signature(s) Signed: 04/18/2020 4:12:15 PM By: [name] EMT/HBOT/SD Signed: 04/18/2020 5:15:42 PM By: Pamela Gouty RN, BSN Signed: 04/18/2020 5:15:42 PM By: Pamela Gouty RN, BSN Entered By: [name] on 04/18/2020 14:10:14 -------------------------------------------------------------------------------- Vitals Details Patient Name: Date of Service: Pamela Pilon, DO NNA J. 04/18/2020 2:00 PM Medical Record Number: [id number] Patient Account Number: [id number] Date of Birth/Sex: Treating RN: 10/03/1945 (75 y.o. [name] Primary Care Janis Sol: [name] Other Clinician: Referring Alondria Mousseau: Treating Rana Hochstein/Extender: [name] in Treatment: 11 Vital Signs Time Taken:  13:40 Temperature (F): 98.3 Height (in): 64 Pulse (bpm): 91 Weight (lbs): 150 Respiratory Rate (breaths/min): 15 Body Mass Index (BMI): 25.7 Blood Pressure (mmHg): 125/72 Reference Range: 80 - 120 mg / dl Electronic Signature(s) Signed: 04/18/2020 4:12:15 PM By: [name] EMT/HBOT/SD Entered By: [name] on 04/18/2020 13:50:20

## 2020-04-19 DIAGNOSIS — Z992 Dependence on renal dialysis: Secondary | ICD-10-CM | POA: Diagnosis not present

## 2020-04-19 DIAGNOSIS — D509 Iron deficiency anemia, unspecified: Secondary | ICD-10-CM | POA: Diagnosis not present

## 2020-04-19 DIAGNOSIS — N2581 Secondary hyperparathyroidism of renal origin: Secondary | ICD-10-CM | POA: Diagnosis not present

## 2020-04-19 DIAGNOSIS — T8149XA Infection following a procedure, other surgical site, initial encounter: Secondary | ICD-10-CM | POA: Diagnosis not present

## 2020-04-19 DIAGNOSIS — N186 End stage renal disease: Secondary | ICD-10-CM | POA: Diagnosis not present

## 2020-04-19 DIAGNOSIS — D631 Anemia in chronic kidney disease: Secondary | ICD-10-CM | POA: Diagnosis not present

## 2020-04-20 ENCOUNTER — Encounter: Payer: Self-pay | Admitting: Adult Health

## 2020-04-20 ENCOUNTER — Non-Acute Institutional Stay (SKILLED_NURSING_FACILITY): Payer: Medicare Other | Admitting: Adult Health

## 2020-04-20 DIAGNOSIS — I25810 Atherosclerosis of coronary artery bypass graft(s) without angina pectoris: Secondary | ICD-10-CM | POA: Diagnosis not present

## 2020-04-20 DIAGNOSIS — N186 End stage renal disease: Secondary | ICD-10-CM | POA: Diagnosis not present

## 2020-04-20 DIAGNOSIS — E0822 Diabetes mellitus due to underlying condition with diabetic chronic kidney disease: Secondary | ICD-10-CM

## 2020-04-20 DIAGNOSIS — Z794 Long term (current) use of insulin: Secondary | ICD-10-CM | POA: Diagnosis not present

## 2020-04-20 DIAGNOSIS — Z992 Dependence on renal dialysis: Secondary | ICD-10-CM

## 2020-04-20 DIAGNOSIS — I10 Essential (primary) hypertension: Secondary | ICD-10-CM

## 2020-04-20 NOTE — Progress Notes (Signed)
Location:  [facility] Room Number: 124-A Place of Service:  SNF (31) Provider:  Durenda Age, DNP, FNP-BC  Patient Care Team: Unk Pinto, MD as PCP - General (Internal Medicine) Josue Hector, MD as PCP - Cardiology (Cardiology) Magrinat, Virgie Dad, MD as Consulting Physician (Oncology) Kyung Rudd, MD as Consulting Physician (Radiation Oncology) Newt Minion, MD as Consulting Physician (Orthopedic Surgery) Philemon Kingdom, MD as Consulting Physician (Internal Medicine) Josue Hector, MD as Consulting Physician (Cardiology) Inda Castle, MD (Inactive) as Consulting Physician (Gastroenterology) Rolm Bookbinder, MD as Consulting Physician (General Surgery) Calvert Cantor, MD as Consulting Physician (Ophthalmology) Madelon Lips, MD as Consulting Physician (Nephrology)  Extended Emergency Contact Information Primary Emergency Contact: [facility] Address: [address]          [place], [place] [address] Johnnette Litter of [place] Phone: [phone] Mobile Phone: [phone] Relation: Spouse Secondary Emergency Contact: [place] of [place] Phone: [phone] Mobile Phone: [phone] Relation: Son  Code Status:  FULL CODE  Goals of care: Advanced Directive information Advanced Directives 03/16/2020  Does Patient Have a Medical Advance Directive? No  Type of Advance Directive -  Does patient want to make changes to medical advance directive? No - Patient declined  Copy of [place] in Chart? -  Would patient like information on creating a medical advance directive? No - Patient declined  Pre-existing out of facility DNR order (yellow form or pink MOST form) -     Chief Complaint  Patient presents with  . Medical Management of Chronic Issues    Short-term rehabilitation visit    HPI:  Pt is a 75 y.o. female seen today for medical management of chronic diseases.  She is a  short-term care resident of [facility].  He has a PMH of breast cancer, CAD, systolic and diastolic CHF, hyperlipidemia, hypertension, ischemic cardiomyopathy, mitral regurgitation, NSTEMI, PAD and vitamin D deficiency.  She was admitted to [facility] on 03/27/2020 post [facility] hospitalization 03/16/2020 to 03/27/2020 for left lower leg ischemia.  She underwent arteriography on 03/25/2019 which revealed bilateral superficial femoral artery occlusions.  She had left femoral to below-knee popliteal bypass with saphenous vein.  She underwent toe amputation with Dr. Sharol Given and eventually had healing of all wounds.  She then developed ulceration over the lateral aspect of her right foot.  Initially, care was initiated at the wound center and was evaluated for adequacy of healing.  On 03/16/2020 she had right external iliac and common femoral endarterectomy with dacron patch angioplasty right femoral to anterior tibial bypass.  She was seen in her room today. Right groin surgical wound was dry but edematous and right leg with erythema. Right lower leg surgical wound is dry and right foot with dressing. SBPs ranging from 114 to 144.  She takes hydralazine 25 mg 3 tabs = 75 mg 3 times a day for hypertension. She goes to hemodialysis 3X a week for ESRD.  Past Medical History:  Diagnosis Date  . Allergy   . Anterior dislocation of left shoulder 05/27/2019  . Arthritis    "maybe in my lower back" (08/27/2012)  . 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 Chronic combined systolic and diastolic CHF (congestive heart failure) ([place])   . CKD (chronic kidney disease), stage IV ([place])   . Coronary artery disease    a. NSTEMI in 05/2009 s/p CABG (LIMA-LAD, SVG-diagonal, SVG-OM1/OM 2).   Marland Kitchen Dyspnea  with exertion  . Family history of anesthesia complication    Mom has a hard time to wake up  . Foot ulcer ([place])    "I've had them on both feet" (08/27/2012)   . Gouty arthritis    "right index finger" (08/27/2012)  . History of radiation therapy 06/09/13-07/06/13   left breast 50Gy  . Hyperlipidemia   . Hypertension   . Infection    right second toe  . Ischemic cardiomyopathy    a. 2011: EF 40-45%. b. EF 55-60% in 04/2017 but shortly after as inpatient was 45-50%.  Marland Kitchen LBBB (left bundle branch block)   . Mitral regurgitation   . Neuropathy    Hx; of B/L feet  . NSTEMI (non-ST elevated myocardial infarction) ([place]) 05/23/2009  . Osteomyelitis of foot ([place])   . PAD (peripheral artery disease) (H[facility])    a. LE PAD (patient previously elected hold off L fem-pop), prev followed by Dr. Bridgett Larsson  . PONV (postoperative nausea and vomiting)   . Sinus headache    "occasionally" (08/27/2012)  . Type II diabetes mellitus ([place])   . Vitamin D deficiency    Past Surgical History:  Procedure Laterality Date  . ABDOMINAL AORTOGRAM W/LOWER EXTREMITY Bilateral 03/25/2019   Procedure: ABDOMINAL AORTOGRAM W/LOWER EXTREMITY;  Surgeon: Marty Heck, MD;  Location: [facility];  Service: Cardiovascular;  Laterality: Bilateral;  . ABDOMINAL AORTOGRAM W/LOWER EXTREMITY Bilateral 02/24/2020   Procedure: ABDOMINAL AORTOGRAM W/LOWER EXTREMITY;  Surgeon: Cherre Robins, MD;  Location: [facility];  Service: Cardiovascular;  Laterality: Bilateral;  . AMPUTATION Right 08/27/2012   Procedure: AMPUTATION RAY;  Surgeon: Newt Minion, MD;  Location: [facility];  Service: Orthopedics;  Laterality: Right;  Right Foot 5th Ray Amputation  . AMPUTATION Left 07/08/2013   Procedure: AMPUTATION RAY;  Surgeon: Newt Minion, MD;  Location: [facility];  Service: Orthopedics;  Laterality: Left;  Left Foot 2nd Ray Amputation  . AMPUTATION Left 04/06/2019   Procedure: LEFT 5TH RAY AMPUTATION;  Surgeon: Newt Minion, MD;  Location: [facility];  Service: Orthopedics;  Laterality: Left;  . AMPUTATION RAY Right 08/27/2012   5th ray/notes 08/27/2012  . AMPUTATION TOE Right 03/06/2017    Procedure: AMPUTATION TOE, INTERPHANGEAL 2ND RIGHT;  Surgeon: [name], DPM;  Location: [facility];  Service: Podiatry;  Laterality: Right;  . AV FISTULA PLACEMENT Right 11/11/2017   Procedure: RIGHT RADIOCEPHALIC  ARTERIOVENOUS FISTULA;  Surgeon: [name], MD;  Location: [facility];  Service: Vascular;  Laterality: Right;  . BREAST LUMPECTOMY  04/20/2013   with biopsy      DR WAKEFIELD  . BREAST LUMPECTOMY WITH NEEDLE LOCALIZATION AND AXILLARY SENTINEL LYMPH NODE BX Left 04/20/2013   Procedure: LEFT BREAST WIRE GUIDED LUMPECTOMY AND AXILLARY SENTINEL LYMPH NODE BX;  Surgeon: Rolm Bookbinder, MD;  Location: [facility];  Service: General;  Laterality: Left;  . CARDIAC CATHETERIZATION  05/24/2009   Archie Endo 05/24/2009 (08/27/2012)  . CATARACT EXTRACTION W/ INTRAOCULAR LENS  IMPLANT, BILATERAL  2000's  . COLONOSCOPY W/ BIOPSIES AND POLYPECTOMY  2010  . CORONARY ARTERY BYPASS GRAFT  2011   "CABG X4" (08/27/2012)  . DENTAL SURGERY  04/30/11   "1 implant" (08/27/2012)  . DILATION AND CURETTAGE OF UTERUS    . ENDARTERECTOMY FEMORAL Left 03/28/2019   Procedure: ENDARTERECTOMY FEMORAL;  Surgeon: [name], MD;  Location: [facility];  Service: Vascular;  Laterality: Left;  . ENDARTERECTOMY FEMORAL Right 03/16/2020   Procedure: RIGHT EXTERNAL ILIAC  ENDARTERECTOMY WITH PATCH ANGIOPLASTY;  Surgeon: [name], MD;  Location: [facility];  Service: Vascular;  Laterality: Right;  . EYE SURGERY    . FEMORAL-POPLITEAL BYPASS GRAFT Left 03/28/2019   Procedure: BYPASS GRAFT FEMORAL-POPLITEAL ARTERY;  Surgeon: [name], MD;  Location: [facility];  Service: Vascular;  Laterality: Left;  . FEMORAL-POPLITEAL BYPASS GRAFT Right 03/16/2020   Procedure: RIGHT COMMON FEMORAL TO ANTERIOR TIBIAL ARTERY BYPASS GRAFT USING PROPATEN GRAFT;  Surgeon: [name], MD;  Location: [facility];  Service: Vascular;  Laterality: Right;  . FINGER SURGERY Right    "index finger; turned out to be gout" (08/27/2012)  . IR FLUORO GUIDE CV LINE RIGHT   11/30/2017  . PATCH ANGIOPLASTY Left 03/28/2019   Procedure: Patch Angioplasty;  Surgeon: [name], MD;  Location: [facility];  Service: Vascular;  Laterality: Left;  . SHOULDER CLOSED REDUCTION Left 05/27/2019   Procedure: CLOSED REDUCTION LEFT SHOULDER;  Surgeon: Mcarthur Rossetti, MD;  Location: [facility];  Service: Orthopedics;  Laterality: Left;  . TEE WITHOUT CARDIOVERSION N/A 12/01/2017   Procedure: TRANSESOPHAGEAL ECHOCARDIOGRAM (TEE);  Surgeon: Lelon Perla, MD;  Location: [facility];  Service: Cardiovascular;  Laterality: N/A;    Allergies  Allergen Reactions  . Atenolol Rash and Other (See Comments)    Exacerbates gout   . Adhesive [Tape] Other (See Comments)    SKIN IS VERY SENSITIVE AND BRUISES AND TEARS EASILY; PLEASE USE AN ALTERNATIVE TO TAPE!!  . Contrast Media [Iodinated Diagnostic Agents] Rash  . Iohexol Rash  . Latex Rash    Outpatient Encounter Medications as of 04/20/2020  Medication Sig  . allopurinol (ZYLOPRIM) 100 MG tablet TAKE 1 TABLET TWICE DAILY TO PREVENT GOUT  . ARTIFICIAL TEAR SOLUTION OP Place 1 drop into both eyes 2 (two) times daily as needed (dry eyes). Refresh  . aspirin EC 81 MG tablet Take 81 mg by mouth daily as needed (Will not take if take Excedrin).   Marland Kitchen atorvastatin (LIPITOR) 10 MG tablet TAKE 1 TABLET DAILY FOR CHOLESTEROL.  Marland Kitchen b complex-vitamin c-folic acid (NEPHRO-VITE) 0.8 MG TABS tablet Take 1 tablet by mouth daily.   . bisacodyl (DULCOLAX) 10 MG suppository Place 10 mg rectally as needed for moderate constipation.  . cinacalcet (SENSIPAR) 30 MG tablet Take 1 tablet (30 mg total) by mouth daily with supper.  . cyclobenzaprine (FLEXERIL) 5 MG tablet Take 1 tablet (5 mg total) by mouth 3 (three) times daily as needed for muscle spasms.  Marland Kitchen docusate sodium (COLACE) 100 MG capsule Take 100 mg by mouth 2 (two) times daily.   Marland Kitchen glucose blood (PRODIGY NO CODING BLOOD GLUC) test strip USE 1 STRIP TO CHECK GLUCOSE THREE TIMES DAILY-DX-E11.9   . hydrALAZINE (APRESOLINE) 25 MG tablet Take 75 mg by mouth 3 (three) times daily. Take 3 tablets to = 75 mg TID  . HYDROcodone-acetaminophen (NORCO/VICODIN) 5-325 MG tablet Take 1 tablet by mouth every 6 (six) hours as needed.  . insulin NPH-regular Human (70-30) 100 UNIT/ML injection Inject 10 Units into the skin 2 (two) times daily with a meal.  . Insulin Pen Needle (BD PEN NEEDLE NANO U/F) 32G X 4 MM MISC USE TO INJECT TWICE DAILY LANTUS INJECTIONS  . Insulin Syringe-Needle U-100 (BD INSULIN SYRINGE U/F) 31G X 5/16" 1 ML MISC Administer Insulin      3 x /day      with Meals      (Dx-e11.29) (Patient taking differently: Administer Insulin  3 x /day      with Meals      (Dx-e11.29))  . isosorbide dinitrate (ISORDIL) 10 MG tablet Take 10 mg by mouth 3 (three) times daily.  Marland Kitchen METAMUCIL FIBER PO Take 1 capsule by mouth daily as needed (Constipation).   . Multiple Vitamins-Minerals (PRESERVISION AREDS 2 PO) Take 2 capsules by mouth daily at 12 noon.   . Nutritional Supplements (NEPRO) LIQD Take by mouth 2 (two) times daily.  . sevelamer carbonate (RENVELA) 800 MG tablet Take 2 tablets (1,600 mg total) by mouth 3 (three) times daily with meals.  . traZODone (DESYREL) 50 MG tablet Take 25-50 mg by mouth at bedtime.   No facility-administered encounter medications on file as of 04/20/2020.    Review of Systems  GENERAL: No change in appetite, no fatigue, no weight changes, no fever, chills or weakness MOUTH and THROAT: Denies oral discomfort, gingival pain or bleeding RESPIRATORY: no cough, SOB, DOE, wheezing, hemoptysis CARDIAC: No chest pain, edema or palpitations GI: No abdominal pain, diarrhea, constipation, heart burn, nausea or vomiting NEUROLOGICAL: Denies dizziness, syncope, numbness, or headache PSYCHIATRIC: Denies feelings of depression or anxiety. No report of hallucinations, insomnia, paranoia, or agitation    Immunization History  Administered Date(s) Administered  .  Hepatitis B, adult 12/24/2017, 01/21/2018, 02/25/2018, 06/24/2018, 12/30/2018, 02/15/2019, 03/17/2019, 07/16/2019  . Influenza, High Dose Seasonal PF 01/30/2014, 02/14/2015, 12/17/2015, 01/22/2017, 01/07/2018, 12/23/2018, 02/13/2020  . Influenza-Unspecified 02/05/2013  . PFIZER SARS-COV-2 Vaccination 05/31/2019, 06/21/2019  . Pneumococcal Conjugate-13 01/30/2014  . Pneumococcal Polysaccharide-23 01/07/2011  . Td 01/13/2012  . Zoster 01/13/2012   Pertinent  Health Maintenance Due  Topic Date Due  . FOOT EXAM  01/09/2020  . COLONOSCOPY (Pts 45-55yrs Insurance coverage will need to be confirmed)  03/08/2020  . HEMOGLOBIN A1C  08/12/2020  . MAMMOGRAM  09/11/2020  . OPHTHALMOLOGY EXAM  11/17/2020  . INFLUENZA VACCINE  Completed  . DEXA SCAN  Completed  . PNA vac Low Risk Adult  Completed   Fall Risk  10/02/2019 06/15/2019 01/09/2019 03/22/2018 12/16/2017  Falls in the past year? 0 1 0 1 No  Number falls in past yr: - 1 - 0 -  Injury with Fall? - 1 - 0 -  Risk for fall due to : No Fall Risks History of fall(s);Impaired balance/gait;Other (Comment) - - -  Follow up Education provided;Falls evaluation completed;Falls prevention discussed Falls evaluation completed;Falls prevention discussed;Education provided Falls evaluation completed;Education provided;Falls prevention discussed - -  Comment - doing PT - - -     Vitals:   04/20/20 1635  Weight: 142 lb 4.8 oz (64.5 kg)  Height: 5\' 4"  (1.626 m)   Body mass index is 24.43 kg/m.  Physical Exam  GENERAL APPEARANCE: Well nourished. In no acute distress. Normal body habitus SKIN:  Right groin surgical incision is dry, right thigh with erythema.  MOUTH and THROAT: Lips are without lesions. Oral mucosa is moist and without lesions. Tongue is normal in shape, size, and color and without lesions RESPIRATORY: Breathing is even & unlabored, BS CTAB CARDIAC: RRR, no murmur,no extra heart sounds GI: Abdomen soft, normal BS, no masses, no  tenderness EXTREMITIES:  Able to move X 4 extremities NEUROLOGICAL: There is no tremor. Speech is clear. Alert and oriented X 3. PSYCHIATRIC:  Affect and behavior are appropriate  Labs reviewed: Recent Labs    06/15/19 1237 10/03/19 1154 02/13/20 1518 02/24/20 0957 03/22/20 0814 03/24/20 1051 03/27/20 0954  NA 138 140 140   < > 135 132*  130*  K 4.6 4.7 4.0   < > 4.1 4.7 5.1  CL 96* 93* 94*   < > 95* 93* 91*  CO2 30 35* 33*   < > 27 26 22   GLUCOSE 270* 88 118*   < > 139* 151* 97  BUN 61* 42* 54*   < > 41* 38* 43*  CREATININE 3.23* 3.91* 5.12*   < > 6.24* 6.18* 7.18*  CALCIUM 8.7 10.2 10.3   < > 9.3 10.1 10.7*  MG 2.5 2.8* 2.7*  --   --   --   --   PHOS  --   --   --    < > 4.2 4.9* 6.6*   < > = values in this interval not displayed.   Recent Labs    10/03/19 1154 02/13/20 1518 03/14/20 1421 03/17/20 0943 03/22/20 0814 03/24/20 1051 03/27/20 0954  AST 19 15 20   --   --   --   --   ALT 18 12 17   --   --   --   --   ALKPHOS  --   --  182*  --   --   --   --   BILITOT 0.5 0.7 0.8  --   --   --   --   PROT 6.3 6.5 6.5  --   --   --   --   ALBUMIN  --   --  3.6   < > 2.9* 2.8* 3.2*   < > = values in this interval not displayed.   Recent Labs    06/15/19 1237 10/03/19 1154 02/13/20 1518 02/24/20 0957 03/22/20 0814 03/24/20 1051 03/27/20 0954  WBC 8.8 7.5 7.3   < > 8.1 6.5 6.9  NEUTROABS 6,301 5,168 5,154  --   --   --   --   HGB 12.1 11.7 12.6   < > 8.3* 8.5* 10.0*  HCT 38.5 36.0 38.4   < > 25.0* 25.4* 30.9*  MCV 93.0 97.8 98.7   < > 99.6 100.4* 102.7*  PLT 247 224 171   < > 178 169 147*   < > = values in this interval not displayed.   Lab Results  Component Value Date   TSH 1.59 02/13/2020   Lab Results  Component Value Date   HGBA1C 5.4 02/13/2020   Lab Results  Component Value Date   CHOL 54 03/16/2020   HDL 24 (L) 03/16/2020   LDLCALC 21 03/16/2020   TRIG 47 03/16/2020   CHOLHDL 2.3 03/16/2020    Assessment/Plan  1. Coronary artery disease  involving coronary bypass graft of native heart without angina pectoris -  No chest pains, continue isosorbide dinitrate  2. ESRD on hemodialysis ([place]) -On hemodialysis 3 times a week -Continue Cinacalcet and sevelamer   3. 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 Essential hypertension -  Stable, continue hydralazine     Family/ staff Communication: Discussed plan of care with resident and charge nurse.  Labs/tests ordered: None  Goals of care:   Short-term care   Durenda Age, DNP, MSN, FNP-BC [facility] [phone] (Monday-Friday 8:00 a.m. - 5:00 p.m.) [phone] (after hours)

## 2020-04-21 DIAGNOSIS — T8149XA Infection following a procedure, other surgical site, initial encounter: Secondary | ICD-10-CM | POA: Diagnosis not present

## 2020-04-21 DIAGNOSIS — N2581 Secondary hyperparathyroidism of renal origin: Secondary | ICD-10-CM | POA: Diagnosis not present

## 2020-04-21 DIAGNOSIS — Z992 Dependence on renal dialysis: Secondary | ICD-10-CM | POA: Diagnosis not present

## 2020-04-21 DIAGNOSIS — D509 Iron deficiency anemia, unspecified: Secondary | ICD-10-CM | POA: Diagnosis not present

## 2020-04-21 DIAGNOSIS — N186 End stage renal disease: Secondary | ICD-10-CM | POA: Diagnosis not present

## 2020-04-21 DIAGNOSIS — D631 Anemia in chronic kidney disease: Secondary | ICD-10-CM | POA: Diagnosis not present

## 2020-04-22 ENCOUNTER — Emergency Department (HOSPITAL_COMMUNITY): Payer: Medicare Other

## 2020-04-22 ENCOUNTER — Encounter (HOSPITAL_COMMUNITY): Payer: Self-pay | Admitting: Emergency Medicine

## 2020-04-22 ENCOUNTER — Inpatient Hospital Stay (HOSPITAL_COMMUNITY)
Admission: EM | Admit: 2020-04-22 | Discharge: 2020-06-05 | DRG: 264 | Disposition: E | Payer: Medicare Other | Source: Skilled Nursing Facility | Attending: Vascular Surgery | Admitting: Vascular Surgery

## 2020-04-22 ENCOUNTER — Encounter (HOSPITAL_COMMUNITY): Admission: EM | Disposition: E | Payer: Self-pay | Source: Skilled Nursing Facility | Attending: Vascular Surgery

## 2020-04-22 ENCOUNTER — Other Ambulatory Visit: Payer: Self-pay

## 2020-04-22 DIAGNOSIS — I252 Old myocardial infarction: Secondary | ICD-10-CM

## 2020-04-22 DIAGNOSIS — Z8051 Family history of malignant neoplasm of kidney: Secondary | ICD-10-CM

## 2020-04-22 DIAGNOSIS — J811 Chronic pulmonary edema: Secondary | ICD-10-CM | POA: Diagnosis not present

## 2020-04-22 DIAGNOSIS — Y832 Surgical operation with anastomosis, bypass or graft as the cause of abnormal reaction of the patient, or of later complication, without mention of misadventure at the time of the procedure: Secondary | ICD-10-CM | POA: Diagnosis present

## 2020-04-22 DIAGNOSIS — Z79899 Other long term (current) drug therapy: Secondary | ICD-10-CM

## 2020-04-22 DIAGNOSIS — N186 End stage renal disease: Secondary | ICD-10-CM | POA: Diagnosis not present

## 2020-04-22 DIAGNOSIS — I255 Ischemic cardiomyopathy: Secondary | ICD-10-CM | POA: Diagnosis present

## 2020-04-22 DIAGNOSIS — L89311 Pressure ulcer of right buttock, stage 1: Secondary | ICD-10-CM | POA: Diagnosis present

## 2020-04-22 DIAGNOSIS — E43 Unspecified severe protein-calorie malnutrition: Secondary | ICD-10-CM | POA: Diagnosis present

## 2020-04-22 DIAGNOSIS — Z803 Family history of malignant neoplasm of breast: Secondary | ICD-10-CM

## 2020-04-22 DIAGNOSIS — E11649 Type 2 diabetes mellitus with hypoglycemia without coma: Secondary | ICD-10-CM | POA: Diagnosis not present

## 2020-04-22 DIAGNOSIS — R040 Epistaxis: Secondary | ICD-10-CM | POA: Diagnosis present

## 2020-04-22 DIAGNOSIS — Z20822 Contact with and (suspected) exposure to covid-19: Secondary | ICD-10-CM | POA: Diagnosis present

## 2020-04-22 DIAGNOSIS — E8889 Other specified metabolic disorders: Secondary | ICD-10-CM | POA: Diagnosis present

## 2020-04-22 DIAGNOSIS — Z923 Personal history of irradiation: Secondary | ICD-10-CM

## 2020-04-22 DIAGNOSIS — Z91041 Radiographic dye allergy status: Secondary | ICD-10-CM

## 2020-04-22 DIAGNOSIS — Z89421 Acquired absence of other right toe(s): Secondary | ICD-10-CM

## 2020-04-22 DIAGNOSIS — N2581 Secondary hyperparathyroidism of renal origin: Secondary | ICD-10-CM | POA: Diagnosis present

## 2020-04-22 DIAGNOSIS — E785 Hyperlipidemia, unspecified: Secondary | ICD-10-CM | POA: Diagnosis present

## 2020-04-22 DIAGNOSIS — I272 Pulmonary hypertension, unspecified: Secondary | ICD-10-CM | POA: Diagnosis present

## 2020-04-22 DIAGNOSIS — Z515 Encounter for palliative care: Secondary | ICD-10-CM

## 2020-04-22 DIAGNOSIS — R69 Illness, unspecified: Secondary | ICD-10-CM

## 2020-04-22 DIAGNOSIS — Z66 Do not resuscitate: Secondary | ICD-10-CM | POA: Diagnosis not present

## 2020-04-22 DIAGNOSIS — Z8249 Family history of ischemic heart disease and other diseases of the circulatory system: Secondary | ICD-10-CM

## 2020-04-22 DIAGNOSIS — R531 Weakness: Secondary | ICD-10-CM | POA: Diagnosis not present

## 2020-04-22 DIAGNOSIS — I10 Essential (primary) hypertension: Secondary | ICD-10-CM | POA: Diagnosis not present

## 2020-04-22 DIAGNOSIS — Z951 Presence of aortocoronary bypass graft: Secondary | ICD-10-CM

## 2020-04-22 DIAGNOSIS — R4182 Altered mental status, unspecified: Secondary | ICD-10-CM

## 2020-04-22 DIAGNOSIS — Z808 Family history of malignant neoplasm of other organs or systems: Secondary | ICD-10-CM

## 2020-04-22 DIAGNOSIS — G9341 Metabolic encephalopathy: Secondary | ICD-10-CM

## 2020-04-22 DIAGNOSIS — L8990 Pressure ulcer of unspecified site, unspecified stage: Secondary | ICD-10-CM | POA: Insufficient documentation

## 2020-04-22 DIAGNOSIS — Z91040 Latex allergy status: Secondary | ICD-10-CM

## 2020-04-22 DIAGNOSIS — Z9889 Other specified postprocedural states: Secondary | ICD-10-CM

## 2020-04-22 DIAGNOSIS — Z888 Allergy status to other drugs, medicaments and biological substances status: Secondary | ICD-10-CM

## 2020-04-22 DIAGNOSIS — Z794 Long term (current) use of insulin: Secondary | ICD-10-CM

## 2020-04-22 DIAGNOSIS — I132 Hypertensive heart and chronic kidney disease with heart failure and with stage 5 chronic kidney disease, or end stage renal disease: Secondary | ICD-10-CM | POA: Diagnosis not present

## 2020-04-22 DIAGNOSIS — E1122 Type 2 diabetes mellitus with diabetic chronic kidney disease: Secondary | ICD-10-CM | POA: Diagnosis present

## 2020-04-22 DIAGNOSIS — I2510 Atherosclerotic heart disease of native coronary artery without angina pectoris: Secondary | ICD-10-CM | POA: Diagnosis present

## 2020-04-22 DIAGNOSIS — R54 Age-related physical debility: Secondary | ICD-10-CM | POA: Diagnosis present

## 2020-04-22 DIAGNOSIS — T827XXA Infection and inflammatory reaction due to other cardiac and vascular devices, implants and grafts, initial encounter: Principal | ICD-10-CM | POA: Diagnosis present

## 2020-04-22 DIAGNOSIS — D631 Anemia in chronic kidney disease: Secondary | ICD-10-CM | POA: Diagnosis present

## 2020-04-22 DIAGNOSIS — Z6821 Body mass index (BMI) 21.0-21.9, adult: Secondary | ICD-10-CM

## 2020-04-22 DIAGNOSIS — Z7982 Long term (current) use of aspirin: Secondary | ICD-10-CM

## 2020-04-22 DIAGNOSIS — J9811 Atelectasis: Secondary | ICD-10-CM | POA: Diagnosis not present

## 2020-04-22 DIAGNOSIS — Z992 Dependence on renal dialysis: Secondary | ICD-10-CM

## 2020-04-22 DIAGNOSIS — R404 Transient alteration of awareness: Secondary | ICD-10-CM

## 2020-04-22 DIAGNOSIS — I5042 Chronic combined systolic (congestive) and diastolic (congestive) heart failure: Secondary | ICD-10-CM | POA: Diagnosis present

## 2020-04-22 DIAGNOSIS — T82868A Thrombosis of vascular prosthetic devices, implants and grafts, initial encounter: Secondary | ICD-10-CM | POA: Diagnosis not present

## 2020-04-22 DIAGNOSIS — E1151 Type 2 diabetes mellitus with diabetic peripheral angiopathy without gangrene: Secondary | ICD-10-CM | POA: Diagnosis present

## 2020-04-22 DIAGNOSIS — J90 Pleural effusion, not elsewhere classified: Secondary | ICD-10-CM

## 2020-04-22 DIAGNOSIS — T8189XA Other complications of procedures, not elsewhere classified, initial encounter: Secondary | ICD-10-CM | POA: Diagnosis present

## 2020-04-22 DIAGNOSIS — I517 Cardiomegaly: Secondary | ICD-10-CM | POA: Diagnosis not present

## 2020-04-22 DIAGNOSIS — Z853 Personal history of malignant neoplasm of breast: Secondary | ICD-10-CM

## 2020-04-22 HISTORY — PX: INCISION AND DRAINAGE OF WOUND: SHX1803

## 2020-04-22 LAB — CBC WITH DIFFERENTIAL/PLATELET
Abs Immature Granulocytes: 0.08 10*3/uL — ABNORMAL HIGH (ref 0.00–0.07)
Basophils Absolute: 0 10*3/uL (ref 0.0–0.1)
Basophils Relative: 0 %
Eosinophils Absolute: 0.1 10*3/uL (ref 0.0–0.5)
Eosinophils Relative: 1 %
HCT: 30.7 % — ABNORMAL LOW (ref 36.0–46.0)
Hemoglobin: 10 g/dL — ABNORMAL LOW (ref 12.0–15.0)
Immature Granulocytes: 1 %
Lymphocytes Relative: 9 %
Lymphs Abs: 1.1 10*3/uL (ref 0.7–4.0)
MCH: 31.3 pg (ref 26.0–34.0)
MCHC: 32.6 g/dL (ref 30.0–36.0)
MCV: 95.9 fL (ref 80.0–100.0)
Monocytes Absolute: 0.7 10*3/uL (ref 0.1–1.0)
Monocytes Relative: 6 %
Neutro Abs: 10.7 10*3/uL — ABNORMAL HIGH (ref 1.7–7.7)
Neutrophils Relative %: 83 %
Platelets: 200 10*3/uL (ref 150–400)
RBC: 3.2 MIL/uL — ABNORMAL LOW (ref 3.87–5.11)
RDW: 16.9 % — ABNORMAL HIGH (ref 11.5–15.5)
WBC: 12.6 10*3/uL — ABNORMAL HIGH (ref 4.0–10.5)
nRBC: 0 % (ref 0.0–0.2)

## 2020-04-22 LAB — PROTIME-INR
INR: 1.6 — ABNORMAL HIGH (ref 0.8–1.2)
Prothrombin Time: 18.8 seconds — ABNORMAL HIGH (ref 11.4–15.2)

## 2020-04-22 LAB — COMPREHENSIVE METABOLIC PANEL
ALT: <5 U/L (ref 0–44)
AST: 13 U/L — ABNORMAL LOW (ref 15–41)
Albumin: 2.2 g/dL — ABNORMAL LOW (ref 3.5–5.0)
Alkaline Phosphatase: 108 U/L (ref 38–126)
Anion gap: 14 (ref 5–15)
BUN: 40 mg/dL — ABNORMAL HIGH (ref 8–23)
CO2: 26 mmol/L (ref 22–32)
Calcium: 7.8 mg/dL — ABNORMAL LOW (ref 8.9–10.3)
Chloride: 96 mmol/L — ABNORMAL LOW (ref 98–111)
Creatinine, Ser: 4.41 mg/dL — ABNORMAL HIGH (ref 0.44–1.00)
GFR, Estimated: 10 mL/min — ABNORMAL LOW (ref >60)
Glucose, Bld: 76 mg/dL (ref 70–99)
Potassium: 3.2 mmol/L — ABNORMAL LOW (ref 3.5–5.1)
Sodium: 136 mmol/L (ref 135–145)
Total Bilirubin: 0.8 mg/dL (ref 0.3–1.2)
Total Protein: 4.9 g/dL — ABNORMAL LOW (ref 6.5–8.1)

## 2020-04-22 LAB — RESP PANEL BY RT-PCR (FLU A&B, COVID) ARPGX2
Influenza A by PCR: NEGATIVE
Influenza B by PCR: NEGATIVE
SARS Coronavirus 2 by RT PCR: NEGATIVE

## 2020-04-22 LAB — LACTIC ACID, PLASMA: Lactic Acid, Venous: 1.3 mmol/L (ref 0.5–1.9)

## 2020-04-22 SURGERY — IRRIGATION AND DEBRIDEMENT WOUND
Anesthesia: General | Site: Groin | Laterality: Right

## 2020-04-22 MED ORDER — FENTANYL CITRATE (PF) 250 MCG/5ML IJ SOLN
INTRAMUSCULAR | Status: AC
  Filled 2020-04-22: qty 5

## 2020-04-22 MED ORDER — MIDAZOLAM HCL 2 MG/2ML IJ SOLN
INTRAMUSCULAR | Status: AC
  Filled 2020-04-22: qty 2

## 2020-04-22 MED ORDER — PROPOFOL 10 MG/ML IV BOLUS
INTRAVENOUS | Status: AC
  Filled 2020-04-22: qty 20

## 2020-04-22 SURGICAL SUPPLY — 43 items
BNDG ELASTIC 4X5.8 VLCR STR LF (GAUZE/BANDAGES/DRESSINGS) IMPLANT
BNDG ELASTIC 6X5.8 VLCR STR LF (GAUZE/BANDAGES/DRESSINGS) IMPLANT
BNDG GAUZE ELAST 4 BULKY (GAUZE/BANDAGES/DRESSINGS) ×1 IMPLANT
CANISTER SUCT 3000ML PPV (MISCELLANEOUS) ×2 IMPLANT
CLIP VESOCCLUDE MED 6/CT (CLIP) ×2 IMPLANT
CLIP VESOCCLUDE SM WIDE 6/CT (CLIP) ×2 IMPLANT
COVER SURGICAL LIGHT HANDLE (MISCELLANEOUS) ×2 IMPLANT
COVER WAND RF STERILE (DRAPES) ×2 IMPLANT
DRAPE HALF SHEET 40X57 (DRAPES) IMPLANT
DRAPE U-SHAPE 76X120 STRL (DRAPES) IMPLANT
DRSG PAD ABDOMINAL 8X10 ST (GAUZE/BANDAGES/DRESSINGS) ×1 IMPLANT
ELECT REM PT RETURN 9FT ADLT (ELECTROSURGICAL) ×2
ELECTRODE REM PT RTRN 9FT ADLT (ELECTROSURGICAL) ×1 IMPLANT
GAUZE SPONGE 4X4 12PLY STRL (GAUZE/BANDAGES/DRESSINGS) ×2 IMPLANT
GAUZE XEROFORM 5X9 LF (GAUZE/BANDAGES/DRESSINGS) IMPLANT
GLOVE BIO SURGEON STRL SZ7.5 (GLOVE) ×2 IMPLANT
GLOVE SURG SS PI 7.0 STRL IVOR (GLOVE) ×1 IMPLANT
GLOVE SURG SS PI 7.5 STRL IVOR (GLOVE) ×1 IMPLANT
GLOVE SURG UNDER POLY LF SZ7 (GLOVE) ×1 IMPLANT
GOWN STRL REUS W/ TWL LRG LVL3 (GOWN DISPOSABLE) ×2 IMPLANT
GOWN STRL REUS W/ TWL XL LVL3 (GOWN DISPOSABLE) ×1 IMPLANT
GOWN STRL REUS W/TWL LRG LVL3 (GOWN DISPOSABLE) ×4
GOWN STRL REUS W/TWL XL LVL3 (GOWN DISPOSABLE) ×2
HANDPIECE INTERPULSE COAX TIP (DISPOSABLE)
IV NS IRRIG 3000ML ARTHROMATIC (IV SOLUTION) ×2 IMPLANT
KIT BASIN OR (CUSTOM PROCEDURE TRAY) ×2 IMPLANT
KIT TURNOVER KIT B (KITS) ×2 IMPLANT
NS IRRIG 1000ML POUR BTL (IV SOLUTION) ×2 IMPLANT
PACK CV ACCESS (CUSTOM PROCEDURE TRAY) IMPLANT
PACK GENERAL/GYN (CUSTOM PROCEDURE TRAY) ×2 IMPLANT
PACK UNIVERSAL I (CUSTOM PROCEDURE TRAY) ×2 IMPLANT
PAD ARMBOARD 7.5X6 YLW CONV (MISCELLANEOUS) ×4 IMPLANT
PULSAVAC PLUS IRRIG FAN TIP (DISPOSABLE)
SET HNDPC FAN SPRY TIP SCT (DISPOSABLE) IMPLANT
SPONGE LAP 18X18 X RAY DECT (DISPOSABLE) ×1 IMPLANT
SUT ETHILON 3 0 PS 1 (SUTURE) IMPLANT
SUT VIC AB 2-0 CTX 36 (SUTURE) IMPLANT
SUT VIC AB 3-0 SH 27 (SUTURE)
SUT VIC AB 3-0 SH 27X BRD (SUTURE) IMPLANT
SUT VICRYL 4-0 PS2 18IN ABS (SUTURE) IMPLANT
TIP FAN IRRIG PULSAVAC PLUS (DISPOSABLE) IMPLANT
TOWEL GREEN STERILE (TOWEL DISPOSABLE) ×2 IMPLANT
WATER STERILE IRR 1000ML POUR (IV SOLUTION) ×2 IMPLANT

## 2020-04-22 NOTE — ED Triage Notes (Signed)
Patient is from [place], she has a graft in right groin, that is drainage and has a foul smell from area.  Patient is on antibiotics.  Patient has had this for about one week.  VSS.

## 2020-04-22 NOTE — ED Provider Notes (Signed)
[facility] EMERGENCY DEPARTMENT Provider Note   CSN: [id number] Arrival date & time: 04/07/2020  2019     History Chief Complaint  Patient presents with  . Abscess    [name] is a 75 y.o. female.  HPI Patient s/p right external iliac and common femoral endarterectomy and right femoral to anterior tibial bypass for critical limb ischemia done 03/16/2020.  She was seen by Dr. Donnetta Hutching 1/10 concerning erythema.  She was started on antibiotic administered during dialysis.  Patient dialyzes Tuesday Thursday Saturday.  She did have dialysis yesterday.  She denies she had any fever that she is aware of.  No nausea or vomiting.  Patient denies she is having chest pain or shortness of breath.  Patient was sent to the emergency department today from rehabilitation center for foul-smelling, active drainage from her vascular graft site in the right groin.  Patient reports is actually less painful right now than it had been several days ago.  He reports pain medications make her constipated and thus at this time she does not want any pain medications.    Past Medical History:  Diagnosis Date  . Allergy   . Anterior dislocation of left shoulder 05/27/2019  . Arthritis    "maybe in my lower back" (08/27/2012)  . 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 Chronic combined systolic and diastolic CHF (congestive heart failure) ([place])   . CKD (chronic kidney disease), stage IV ([place])   . Coronary artery disease    a. NSTEMI in 05/2009 s/p CABG (LIMA-LAD, SVG-diagonal, SVG-OM1/OM 2).   Marland Kitchen Dyspnea    with exertion  . Family history of anesthesia complication    Mom has a hard time to wake up  . Foot ulcer ([place])    "I've had them on both feet" (08/27/2012)  . Gouty arthritis    "right index finger" (08/27/2012)  . History of radiation therapy 06/09/13-07/06/13   left breast 50Gy  . Hyperlipidemia   . Hypertension   . Infection    right  second toe  . Ischemic cardiomyopathy    a. 2011: EF 40-45%. b. EF 55-60% in 04/2017 but shortly after as inpatient was 45-50%.  Marland Kitchen LBBB (left bundle branch block)   . Mitral regurgitation   . Neuropathy    Hx; of B/L feet  . NSTEMI (non-ST elevated myocardial infarction) ([place]) 05/23/2009  . Osteomyelitis of foot ([place])   . PAD (peripheral artery disease) ([facility])    a. LE PAD (patient previously elected hold off L fem-pop), prev followed by Dr. Bridgett Larsson  . PONV (postoperative nausea and vomiting)   . Sinus headache    "occasionally" (08/27/2012)  . Type II diabetes mellitus ([place])   . Vitamin D deficiency     Patient Active Problem List   Diagnosis Date Noted  . Critical lower limb ischemia ([place]) 03/16/2020  . Left toe amputee ([place]) 02/13/2020  . Abnormal EKG 02/13/2020  . Diabetic retinopathy of both eyes associated with type 2 diabetes mellitus ([place]) 02/10/2020  . Headache, unspecified 08/02/2019  . Neuropathic pain   . Labile blood glucose   . Debility 04/07/2019  . History of complete ray amputation of fifth toe of left foot ([place]) 04/07/2019  . S/P femoral-popliteal bypass surgery 04/07/2019  . Chronic bilateral low back pain without sciatica   . Abscess of right foot   . Cellulitis of unspecified part of limb 01/27/2019  . Hyperlipidemia associated with type 2  diabetes mellitus ([place]) 01/09/2019  . Diabetes mellitus due to underlying condition with chronic kidney disease on chronic dialysis, with long-term current use of insulin ([place]) 01/09/2019  . Mild protein-calorie malnutrition ([place]) 09/06/2018  . Anemia in chronic kidney disease 09/03/2018  . Coagulation defect, unspecified ([place]) 09/03/2018  . Iron deficiency anemia, unspecified 09/03/2018  . Pain, unspecified 09/03/2018  . Secondary hyperparathyroidism of renal origin ([place]) 09/03/2018  . End stage renal disease ([place]) 05/24/2018  . ESRD on hemodialysis ([place])   . Generalized weakness   . Mitral valve mass   . Coronary artery  disease involving coronary bypass graft of native heart without angina pectoris   . Anemia 05/03/2017  . Diabetes mellitus type 2, insulin dependent ([place]) 05/08/2015  . Overweight (BMI 25.0-29.9) 02/14/2015  . Medication management 08/04/2014  . Diabetic neuropathy ([place]) 02/17/2014  . Gouty arthritis   . Vitamin D deficiency   . Malignant neoplasm of upper-inner quadrant of left breast in female, estrogen receptor positive ([place]) 02/14/2013  . PVD (peripheral vascular disease) ([place]) 09/05/2009  . Essential hypertension 06/20/2009  . Congestive heart failure ([place]) 06/20/2009  . Asthma 06/20/2009    Past Surgical History:  Procedure Laterality Date  . ABDOMINAL AORTOGRAM W/LOWER EXTREMITY Bilateral 03/25/2019   Procedure: ABDOMINAL AORTOGRAM W/LOWER EXTREMITY;  Surgeon: Marty Heck, MD;  Location: [place] [facility];  Service: Cardiovascular;  Laterality: Bilateral;  . ABDOMINAL AORTOGRAM W/LOWER EXTREMITY Bilateral 02/24/2020   Procedure: ABDOMINAL AORTOGRAM W/LOWER EXTREMITY;  Surgeon: Cherre Robins, MD;  Location: [place] [facility];  Service: Cardiovascular;  Laterality: Bilateral;  . AMPUTATION Right 08/27/2012   Procedure: AMPUTATION RAY;  Surgeon: Newt Minion, MD;  Location: [place];  Service: Orthopedics;  Laterality: Right;  Right Foot 5th Ray Amputation  . AMPUTATION Left 07/08/2013   Procedure: AMPUTATION RAY;  Surgeon: Newt Minion, MD;  Location: [place];  Service: Orthopedics;  Laterality: Left;  Left Foot 2nd Ray Amputation  . AMPUTATION Left 04/06/2019   Procedure: LEFT 5TH RAY AMPUTATION;  Surgeon: Newt Minion, MD;  Location: [place];  Service: Orthopedics;  Laterality: Left;  . AMPUTATION RAY Right 08/27/2012   5th ray/notes 08/27/2012  . AMPUTATION TOE Right 03/06/2017   Procedure: AMPUTATION TOE, INTERPHANGEAL 2ND RIGHT;  Surgeon: [name], DPM;  Location: [place];  Service: Podiatry;  Laterality: Right;  . AV FISTULA PLACEMENT Right 11/11/2017    Procedure: RIGHT RADIOCEPHALIC  ARTERIOVENOUS FISTULA;  Surgeon: [name], MD;  Location: [place];  Service: Vascular;  Laterality: Right;  . BREAST LUMPECTOMY  04/20/2013   with biopsy      DR WAKEFIELD  . BREAST LUMPECTOMY WITH NEEDLE LOCALIZATION AND AXILLARY SENTINEL LYMPH NODE BX Left 04/20/2013   Procedure: LEFT BREAST WIRE GUIDED LUMPECTOMY AND AXILLARY SENTINEL LYMPH NODE BX;  Surgeon: Rolm Bookbinder, MD;  Location: [place];  Service: General;  Laterality: Left;  . CARDIAC CATHETERIZATION  05/24/2009   Archie Endo 05/24/2009 (08/27/2012)  . CATARACT EXTRACTION W/ INTRAOCULAR LENS  IMPLANT, BILATERAL  2000's  . COLONOSCOPY W/ BIOPSIES AND POLYPECTOMY  2010  . CORONARY ARTERY BYPASS GRAFT  2011   "CABG X4" (08/27/2012)  . DENTAL SURGERY  04/30/11   "1 implant" (08/27/2012)  . DILATION AND CURETTAGE OF UTERUS    . ENDARTERECTOMY FEMORAL Left 03/28/2019   Procedure: ENDARTERECTOMY FEMORAL;  Surgeon: [name], MD;  Location: [place];  Service: Vascular;  Laterality: Left;  . ENDARTERECTOMY FEMORAL Right 03/16/2020   Procedure: RIGHT  EXTERNAL ILIAC  ENDARTERECTOMY WITH PATCH ANGIOPLASTY;  Surgeon: [name], MD;  Location: [place];  Service: Vascular;  Laterality: Right;  . EYE SURGERY    . FEMORAL-POPLITEAL BYPASS GRAFT Left 03/28/2019   Procedure: BYPASS GRAFT FEMORAL-POPLITEAL ARTERY;  Surgeon: [name], MD;  Location: [place];  Service: Vascular;  Laterality: Left;  . FEMORAL-POPLITEAL BYPASS GRAFT Right 03/16/2020   Procedure: RIGHT COMMON FEMORAL TO ANTERIOR TIBIAL ARTERY BYPASS GRAFT USING PROPATEN GRAFT;  Surgeon: [name], MD;  Location: [place];  Service: Vascular;  Laterality: Right;  . FINGER SURGERY Right    "index finger; turned out to be gout" (08/27/2012)  . IR FLUORO GUIDE CV LINE RIGHT  11/30/2017  . PATCH ANGIOPLASTY Left 03/28/2019   Procedure: Patch Angioplasty;  Surgeon: [name], MD;  Location: [place];  Service: Vascular;  Laterality: Left;  . SHOULDER CLOSED  REDUCTION Left 05/27/2019   Procedure: CLOSED REDUCTION LEFT SHOULDER;  Surgeon: Mcarthur Rossetti, MD;  Location: [facility];  Service: Orthopedics;  Laterality: Left;  . TEE WITHOUT CARDIOVERSION N/A 12/01/2017   Procedure: TRANSESOPHAGEAL ECHOCARDIOGRAM (TEE);  Surgeon: Lelon Perla, MD;  Location: [facility];  Service: Cardiovascular;  Laterality: N/A;     OB History   No obstetric history on file.     Family History  Problem Relation Age of Onset  . Kidney cancer Brother 72  . Hypertension Brother   . Breast cancer Sister 42       LCIS; BRCA negative  . Throat cancer Father 96       smoker  . Breast cancer Sister 72       Lobular breast cancer  . Breast cancer Maternal Aunt        dx in her 37s    Social History   Tobacco Use  . Smoking status: Never Smoker  . Smokeless tobacco: Never Used  Vaping Use  . Vaping Use: Never used  Substance Use Topics  . Alcohol use: Not Currently    Comment: rarely drinks wine  . Drug use: No    Home Medications Prior to Admission medications   Medication Sig Start Date End Date Taking? Authorizing Provider  allopurinol (ZYLOPRIM) 100 MG tablet TAKE 1 TABLET TWICE DAILY TO PREVENT GOUT 10/28/19   Unk Pinto, MD  ARTIFICIAL TEAR SOLUTION OP Place 1 drop into both eyes 2 (two) times daily as needed (dry eyes). Refresh    [provider]  aspirin EC 81 MG tablet Take 81 mg by mouth daily as needed (Will not take if take Excedrin).     [provider]  atorvastatin (LIPITOR) 10 MG tablet TAKE 1 TABLET DAILY FOR CHOLESTEROL. 03/07/20   Garnet Sierras, NP  b complex-vitamin c-folic acid (NEPHRO-VITE) 0.8 MG TABS tablet Take 1 tablet by mouth daily.  03/19/18   [provider]  bisacodyl (DULCOLAX) 10 MG suppository Place 10 mg rectally as needed for moderate constipation.    [provider]  cinacalcet (SENSIPAR) 30 MG tablet Take 1 tablet (30 mg total) by mouth daily with supper. 04/19/19    Angiulli, Lavon Paganini, PA-C  cyclobenzaprine (FLEXERIL) 5 MG tablet Take 1 tablet (5 mg total) by mouth 3 (three) times daily as needed for muscle spasms. 06/22/19   Mcarthur Rossetti, MD  docusate sodium (COLACE) 100 MG capsule Take 100 mg by mouth 2 (two) times daily.     [provider]  glucose blood (PRODIGY NO CODING BLOOD GLUC) test  strip USE 1 STRIP TO CHECK GLUCOSE THREE TIMES DAILY-DX-E11.9 Patient not taking: Reported on 05/07/2020 11/29/19   Unk Pinto, MD  hydrALAZINE (APRESOLINE) 25 MG tablet Take 75 mg by mouth 3 (three) times daily. Take 3 tablets to = 75 mg TID    [provider]  HYDROcodone-acetaminophen (NORCO/VICODIN) 5-325 MG tablet Take 1 tablet by mouth every 6 (six) hours as needed. 03/27/20   Dagoberto Ligas, PA-C  insulin NPH-regular Human (70-30) 100 UNIT/ML injection Inject 10 Units into the skin 2 (two) times daily with a meal.    [provider]  Insulin Pen Needle (BD PEN NEEDLE NANO U/F) 32G X 4 MM MISC USE TO INJECT TWICE DAILY LANTUS INJECTIONS Patient not taking: Reported on 04/29/2020 01/27/20   Unk Pinto, MD  Insulin Syringe-Needle U-100 (BD INSULIN SYRINGE U/F) 31G X 5/16" 1 ML MISC Administer Insulin      3 x /day      with Meals      (Dx-e11.29) Patient not taking: Reported on 05/03/2020 03/06/20   Unk Pinto, MD  isosorbide dinitrate (ISORDIL) 10 MG tablet Take 10 mg by mouth 3 (three) times daily.    [provider]  METAMUCIL FIBER PO Take 1 capsule by mouth daily as needed (Constipation).     [provider]  Multiple Vitamins-Minerals (PRESERVISION AREDS 2 PO) Take 2 capsules by mouth daily at 12 noon.     [provider]  Nutritional Supplements (NEPRO) LIQD Take by mouth 2 (two) times daily.    [provider]  sevelamer carbonate (RENVELA) 800 MG tablet Take 2 tablets (1,600 mg total) by mouth 3 (three) times daily with meals. 04/19/19   Angiulli, Lavon Paganini, PA-C  traZODone  (DESYREL) 50 MG tablet Take 25-50 mg by mouth at bedtime.    [provider]    Allergies    Atenolol, Adhesive [tape], Contrast media [iodinated diagnostic agents], Iohexol, and Latex  Review of Systems   Review of Systems 10 Systems reviewed and negative except as per HPI Physical Exam Updated Vital Signs BP (!) 115/51 (BP Location: Left Arm)   Pulse 81   Temp 98.5 F (36.9 C) (Oral)   Resp 18   SpO2 95%   Physical Exam Constitutional:      Comments: Patient is alert.  Nontoxic.  She is deconditioned and fatigued in appearance.  No respiratory distress at rest.  HENT:     Mouth/Throat:     Pharynx: Oropharynx is clear.  Eyes:     Extraocular Movements: Extraocular movements intact.  Cardiovascular:     Rate and Rhythm: Normal rate and regular rhythm.  Pulmonary:     Effort: Pulmonary effort is normal.     Breath sounds: Normal breath sounds.  Abdominal:     General: There is no distension.     Palpations: Abdomen is soft.     Tenderness: There is no abdominal tenderness.     Comments: Patient has deep erythema over the inguinal region on the right.  There is a linear, surgical scar that will produce copious, purulent fluid with light to moderate pressure in the groin.  Distally, on the upper thigh there is a baseball size swelling with bright erythema surrounding it.  Skin:    General: Skin is warm and dry.  Neurological:     General: No focal deficit present.     Mental Status: She is oriented to person, place, and time.     Coordination: Coordination normal.  Psychiatric:  Mood and Affect: Mood normal.     ED Results / Procedures / Treatments   Labs (all labs ordered are listed, but only abnormal results are displayed) Labs Reviewed  COMPREHENSIVE METABOLIC PANEL - Abnormal; Notable for the following components:      Result Value   Potassium 3.2 (*)    Chloride 96 (*)    BUN 40 (*)    Creatinine, Ser 4.41 (*)    Calcium 7.8 (*)    Total  Protein 4.9 (*)    Albumin 2.2 (*)    AST 13 (*)    GFR, Estimated 10 (*)    All other components within normal limits  CBC WITH DIFFERENTIAL/PLATELET - Abnormal; Notable for the following components:   WBC 12.6 (*)    RBC 3.20 (*)    Hemoglobin 10.0 (*)    HCT 30.7 (*)    RDW 16.9 (*)    Neutro Abs 10.7 (*)    Abs Immature Granulocytes 0.08 (*)    All other components within normal limits  PROTIME-INR - Abnormal; Notable for the following components:   Prothrombin Time 18.8 (*)    INR 1.6 (*)    All other components within normal limits  CULTURE, BLOOD (ROUTINE X 2)  CULTURE, BLOOD (ROUTINE X 2)  LACTIC ACID, PLASMA  LACTIC ACID, PLASMA    EKG EKG Interpretation  Date/Time:  Sunday April 22 2020 21:26:57 EST Ventricular Rate:  81 PR Interval:  198 QRS Duration: 162 QT Interval:  458 QTC Calculation: 532 R Axis:   -94 Text Interpretation: Normal sinus rhythm Right superior axis deviation Non-specific intra-ventricular conduction block Minimal voltage criteria for LVH, may be normal variant ( Cornell product ) Abnormal ECG no sig change from previous Confirmed by Charlesetta Shanks [phone]) on 04/11/2020 10:45:36 PM   Radiology DG Chest 1 View  Result Date: 05/03/2020 CLINICAL DATA:  Weakness EXAM: CHEST  1 VIEW COMPARISON:  03/23/2019 FINDINGS: Moderate cardiomegaly. Central pulmonary vascular congestion without overt edema. No focal airspace consolidation. Normal pleural spaces. Remote median sternotomy. IMPRESSION: Cardiomegaly and central pulmonary vascular congestion without overt pulmonary edema. Electronically Signed   By: Ulyses Jarred M.D.   On: 04/15/2020 22:05    Procedures Procedures (including critical care time)  Medications Ordered in ED Medications - No data to display  ED Course  I have reviewed the triage vital signs and the nursing notes.  Pertinent labs & imaging results that were available during my care of the patient were reviewed by me and  considered in my medical decision making (see chart for details).    MDM Rules/Calculators/A&P                          Consult: Dr. Donzetta Matters vascular surgery.  He has evaluated the patient in the emergency department and determined she will go directly to the OR for incision and drainage.  Patient presents as outlined.  She has had a progressive worsening of redness and swelling of her vascular graft in the right groin.  She has been getting antibiotics at dialysis session.  Symptoms clearly have worsened with copious, foul-smelling purulent drainage and surrounding cellulitis.  At this time, patient is not showing active signs of sepsis.  Vital signs are stable.  Patient has not eaten since noon.  Dr. Donzetta Matters arranging for OR wound debridement tonight directly from the emergency department. Final Clinical Impression(s) / ED Diagnoses Final diagnoses:  Infection of prosthetic vascular graft, initial  encounter [facility])  Severe comorbid illness    Rx / DC Orders ED Discharge Orders    None       Charlesetta Shanks, MD 04/20/2020 [id number]

## 2020-04-22 NOTE — H&P (Signed)
H+P    Reason for Consult: Concern for wound infection Referring Physician: Dr. Johnney Killian MRN #:  [id number]  History of Present Illness: This is a 75 y.o. female status post right external iliac and common femoral endarterectomy with right femoral to anterior tibial artery bypass over 1 month ago.  This was done for critical limb ischemia.  She has been followed in the wound center and has been in skilled nursing facility.  She was last seen with Dr. Donnetta Hutching 6 days ago.  At that time she was set up for IV antibiotics with dialysis. She last dialyzed yesterday and believes she did receive the antibiotics. She started having drainage earlier today. She states that it has been been increasing in pain over the past few days to the point where she has limited movement of the right lower extremity. Her other wounds are healing well and she is following at the wound center for the foot wound. She is on aspirin and statin does not take blood thinners.  Past Medical History:  Diagnosis Date  . Allergy   . Anterior dislocation of left shoulder 05/27/2019  . Arthritis    "maybe in my lower back" (08/27/2012)  . 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 Chronic combined systolic and diastolic CHF (congestive heart failure) ([place])   . CKD (chronic kidney disease), stage IV ([place])   . Coronary artery disease    a. NSTEMI in 05/2009 s/p CABG (LIMA-LAD, SVG-diagonal, SVG-OM1/OM 2).   Marland Kitchen Dyspnea    with exertion  . Family history of anesthesia complication    Mom has a hard time to wake up  . Foot ulcer ([place])    "I've had them on both feet" (08/27/2012)  . Gouty arthritis    "right index finger" (08/27/2012)  . History of radiation therapy 06/09/13-07/06/13   left breast 50Gy  . Hyperlipidemia   . Hypertension   . Infection    right second toe  . Ischemic cardiomyopathy    a. 2011: EF 40-45%. b. EF 55-60% in 04/2017 but shortly after as inpatient was 45-50%.  Marland Kitchen  LBBB (left bundle branch block)   . Mitral regurgitation   . Neuropathy    Hx; of B/L feet  . NSTEMI (non-ST elevated myocardial infarction) ([place]) 05/23/2009  . Osteomyelitis of foot ([place])   . PAD (peripheral artery disease) ([facility])    a. LE PAD (patient previously elected hold off L fem-pop), prev followed by Dr. Bridgett Larsson  . PONV (postoperative nausea and vomiting)   . Sinus headache    "occasionally" (08/27/2012)  . Type II diabetes mellitus ([facility])   . Vitamin D deficiency     Past Surgical History:  Procedure Laterality Date  . ABDOMINAL AORTOGRAM W/LOWER EXTREMITY Bilateral 03/25/2019   Procedure: ABDOMINAL AORTOGRAM W/LOWER EXTREMITY;  Surgeon: Marty Heck, MD;  Location: [facility];  Service: Cardiovascular;  Laterality: Bilateral;  . ABDOMINAL AORTOGRAM W/LOWER EXTREMITY Bilateral 02/24/2020   Procedure: ABDOMINAL AORTOGRAM W/LOWER EXTREMITY;  Surgeon: Cherre Robins, MD;  Location: [facility];  Service: Cardiovascular;  Laterality: Bilateral;  . AMPUTATION Right 08/27/2012   Procedure: AMPUTATION RAY;  Surgeon: Newt Minion, MD;  Location: [facility];  Service: Orthopedics;  Laterality: Right;  Right Foot 5th Ray Amputation  . AMPUTATION Left 07/08/2013   Procedure: AMPUTATION RAY;  Surgeon: Newt Minion, MD;  Location: [facility];  Service: Orthopedics;  Laterality: Left;  Left Foot 2nd Ray Amputation  .  AMPUTATION Left 04/06/2019   Procedure: LEFT 5TH RAY AMPUTATION;  Surgeon: Newt Minion, MD;  Location: [facility];  Service: Orthopedics;  Laterality: Left;  . AMPUTATION RAY Right 08/27/2012   5th ray/notes 08/27/2012  . AMPUTATION TOE Right 03/06/2017   Procedure: AMPUTATION TOE, INTERPHANGEAL 2ND RIGHT;  Surgeon: [name], DPM;  Location: [facility];  Service: Podiatry;  Laterality: Right;  . AV FISTULA PLACEMENT Right 11/11/2017   Procedure: RIGHT RADIOCEPHALIC  ARTERIOVENOUS FISTULA;  Surgeon: [name], MD;  Location: [facility];  Service: Vascular;  Laterality:  Right;  . BREAST LUMPECTOMY  04/20/2013   with biopsy      DR WAKEFIELD  . BREAST LUMPECTOMY WITH NEEDLE LOCALIZATION AND AXILLARY SENTINEL LYMPH NODE BX Left 04/20/2013   Procedure: LEFT BREAST WIRE GUIDED LUMPECTOMY AND AXILLARY SENTINEL LYMPH NODE BX;  Surgeon: Rolm Bookbinder, MD;  Location: [facility];  Service: General;  Laterality: Left;  . CARDIAC CATHETERIZATION  05/24/2009   Archie Endo 05/24/2009 (08/27/2012)  . CATARACT EXTRACTION W/ INTRAOCULAR LENS  IMPLANT, BILATERAL  2000's  . COLONOSCOPY W/ BIOPSIES AND POLYPECTOMY  2010  . CORONARY ARTERY BYPASS GRAFT  2011   "CABG X4" (08/27/2012)  . DENTAL SURGERY  04/30/11   "1 implant" (08/27/2012)  . DILATION AND CURETTAGE OF UTERUS    . ENDARTERECTOMY FEMORAL Left 03/28/2019   Procedure: ENDARTERECTOMY FEMORAL;  Surgeon: [name], MD;  Location: [facility];  Service: Vascular;  Laterality: Left;  . ENDARTERECTOMY FEMORAL Right 03/16/2020   Procedure: RIGHT EXTERNAL ILIAC  ENDARTERECTOMY WITH PATCH ANGIOPLASTY;  Surgeon: [name], MD;  Location: [facility];  Service: Vascular;  Laterality: Right;  . EYE SURGERY    . FEMORAL-POPLITEAL BYPASS GRAFT Left 03/28/2019   Procedure: BYPASS GRAFT FEMORAL-POPLITEAL ARTERY;  Surgeon: [name], MD;  Location: [facility];  Service: Vascular;  Laterality: Left;  . FEMORAL-POPLITEAL BYPASS GRAFT Right 03/16/2020   Procedure: RIGHT COMMON FEMORAL TO ANTERIOR TIBIAL ARTERY BYPASS GRAFT USING PROPATEN GRAFT;  Surgeon: [name], MD;  Location: [facility];  Service: Vascular;  Laterality: Right;  . FINGER SURGERY Right    "index finger; turned out to be gout" (08/27/2012)  . IR FLUORO GUIDE CV LINE RIGHT  11/30/2017  . PATCH ANGIOPLASTY Left 03/28/2019   Procedure: Patch Angioplasty;  Surgeon: [name], MD;  Location: [facility];  Service: Vascular;  Laterality: Left;  . SHOULDER CLOSED REDUCTION Left 05/27/2019   Procedure: CLOSED REDUCTION LEFT SHOULDER;  Surgeon: Mcarthur Rossetti, MD;  Location: [facility];   Service: Orthopedics;  Laterality: Left;  . TEE WITHOUT CARDIOVERSION N/A 12/01/2017   Procedure: TRANSESOPHAGEAL ECHOCARDIOGRAM (TEE);  Surgeon: Lelon Perla, MD;  Location: [facility];  Service: Cardiovascular;  Laterality: N/A;    Allergies  Allergen Reactions  . Atenolol Rash and Other (See Comments)    Exacerbates gout   . Adhesive [Tape] Other (See Comments)    SKIN IS VERY SENSITIVE AND BRUISES AND TEARS EASILY; PLEASE USE AN ALTERNATIVE TO TAPE!!  . Contrast Media [Iodinated Diagnostic Agents] Rash  . Iohexol Rash  . Latex Rash    Prior to Admission medications   Medication Sig Start Date End Date Taking? Authorizing Provider  allopurinol (ZYLOPRIM) 100 MG tablet TAKE 1 TABLET TWICE DAILY TO PREVENT GOUT 10/28/19   Unk Pinto, MD  ARTIFICIAL TEAR SOLUTION OP Place 1 drop into both eyes 2 (two) times daily as needed (dry eyes). Refresh    [provider]  aspirin  EC 81 MG tablet Take 81 mg by mouth daily as needed (Will not take if take Excedrin).     [provider]  atorvastatin (LIPITOR) 10 MG tablet TAKE 1 TABLET DAILY FOR CHOLESTEROL. 03/07/20   Garnet Sierras, NP  b complex-vitamin c-folic acid (NEPHRO-VITE) 0.8 MG TABS tablet Take 1 tablet by mouth daily.  03/19/18   [provider]  bisacodyl (DULCOLAX) 10 MG suppository Place 10 mg rectally as needed for moderate constipation.    [provider]  cinacalcet (SENSIPAR) 30 MG tablet Take 1 tablet (30 mg total) by mouth daily with supper. 04/19/19   Angiulli, Lavon Paganini, PA-C  cyclobenzaprine (FLEXERIL) 5 MG tablet Take 1 tablet (5 mg total) by mouth 3 (three) times daily as needed for muscle spasms. 06/22/19   Mcarthur Rossetti, MD  docusate sodium (COLACE) 100 MG capsule Take 100 mg by mouth 2 (two) times daily.     [provider]  glucose blood (PRODIGY NO CODING BLOOD GLUC) test strip USE 1 STRIP TO CHECK GLUCOSE THREE TIMES DAILY-DX-E11.9 Patient not taking:  Reported on 05/06/2020 11/29/19   Unk Pinto, MD  hydrALAZINE (APRESOLINE) 25 MG tablet Take 75 mg by mouth 3 (three) times daily. Take 3 tablets to = 75 mg TID    [provider]  HYDROcodone-acetaminophen (NORCO/VICODIN) 5-325 MG tablet Take 1 tablet by mouth every 6 (six) hours as needed. 03/27/20   Dagoberto Ligas, PA-C  insulin NPH-regular Human (70-30) 100 UNIT/ML injection Inject 10 Units into the skin 2 (two) times daily with a meal.    [provider]  Insulin Pen Needle (BD PEN NEEDLE NANO U/F) 32G X 4 MM MISC USE TO INJECT TWICE DAILY LANTUS INJECTIONS Patient not taking: Reported on 04/10/2020 01/27/20   Unk Pinto, MD  Insulin Syringe-Needle U-100 (BD INSULIN SYRINGE U/F) 31G X 5/16" 1 ML MISC Administer Insulin      3 x /day      with Meals      (Dx-e11.29) Patient not taking: Reported on 05/05/2020 03/06/20   Unk Pinto, MD  isosorbide dinitrate (ISORDIL) 10 MG tablet Take 10 mg by mouth 3 (three) times daily.    [provider]  METAMUCIL FIBER PO Take 1 capsule by mouth daily as needed (Constipation).     [provider]  Multiple Vitamins-Minerals (PRESERVISION AREDS 2 PO) Take 2 capsules by mouth daily at 12 noon.     [provider]  Nutritional Supplements (NEPRO) LIQD Take by mouth 2 (two) times daily.    [provider]  sevelamer carbonate (RENVELA) 800 MG tablet Take 2 tablets (1,600 mg total) by mouth 3 (three) times daily with meals. 04/19/19   Angiulli, Lavon Paganini, PA-C  traZODone (DESYREL) 50 MG tablet Take 25-50 mg by mouth at bedtime.    [provider]    Social History   Socioeconomic History  . Marital status: Married    Spouse name: [name]  . Number of children: 1  . Years of education: Not on file  . Highest education level: Not on file  Occupational History  . Not on file  Tobacco Use  . Smoking status: Never Smoker  . Smokeless tobacco: Never Used  Vaping Use  . Vaping Use:  Never used  Substance and Sexual Activity  . Alcohol use: Not Currently    Comment: rarely drinks wine  . Drug use: No  . Sexual activity: Not Currently  Other Topics Concern  . Not on file  Social History Narrative   ** Merged History Encounter **       Social Determinants of Health   Financial Resource Strain: Not on file  Food Insecurity: Not on file  Transportation Needs: Not on file  Physical Activity: Not on file  Stress: Not on file  Social Connections: Not on file  Intimate Partner Violence: Not on file     Family History  Problem Relation Age of Onset  . Kidney cancer Brother 32  . Hypertension Brother   . Breast cancer Sister 52       LCIS; BRCA negative  . Throat cancer Father 30       smoker  . Breast cancer Sister 76       Lobular breast cancer  . Breast cancer Maternal Aunt        dx in her 68s    ROS: '[x]'  Positive   '[ ]'  Negative   '[ ]'  All sytems reviewed and are negative  Cardiovascular: '[]'  chest pain/pressure '[]'  palpitations '[]'  SOB lying flat '[]'  DOE '[]'  pain in legs while walking '[]'  pain in legs at rest '[]'  pain in legs at night '[x]'  non-healing ulcers '[]'  hx of DVT '[]'  swelling in legs  Pulmonary: '[]'  productive cough '[]'  asthma/wheezing '[]'  home O2  Neurologic: '[]'  weakness in '[]'  arms '[]'  legs '[]'  numbness in '[]'  arms '[]'  legs '[]'  hx of CVA '[]'  mini stroke '[]' difficulty speaking or slurred speech '[]'  temporary loss of vision in one eye '[]'  dizziness  Hematologic: '[]'  hx of cancer '[]'  bleeding problems '[]'  problems with blood clotting easily  Endocrine:   '[]'  diabetes '[]'  thyroid disease  GI '[]'  vomiting blood '[]'  blood in stool  GU: '[]'  CKD/renal failure '[]'  HD--'[]'  M/W/F or '[]'  T/T/S '[]'  burning with urination '[]'  blood in urine  Psychiatric: '[]'  anxiety '[]'  depression  Musculoskeletal: '[]'  arthritis '[]'  joint pain  Integumentary: (x) Surgical wound right groin  Constitutional: '[]'  fever '[]'  chills   Physical Examination  Vitals:    05/04/2020 2038 05/02/2020 2131  BP: (!) 118/50 (!) 115/51  Pulse: 81 81  Resp: 16 18  Temp: 98.5 F (36.9 C)   SpO2: 95% 95%   There is no height or weight on file to calculate BMI.  General:  nad HENT: WNL, normocephalic Pulmonary: normal non-labored breathing Cardiac: Regular rate and rhythm Abdomen:  soft, NT/ND, no masses Extremities: Right groin is draining frank pus that is foul-smelling through approximately 1 cm opening in her previously healed incision. Medial right thigh with significant erythema and warmth Below knee incision on the right is well-healed there is no apparent erythema Neurologic: A&O X 3; Appropriate Affect ; SENSATION: normal; MOTOR FUNCTION:  moving all extremities equally. Speech is fluent/normal   CBC    Component Value Date/Time   WBC 12.6 (H) 04/14/2020 2141   RBC 3.20 (L) 04/11/2020 2141   HGB 10.0 (L) 04/19/2020 2141   HGB 11.7 05/18/2017 1218   HGB 12.3 02/02/2017 1136   HCT 30.7 (L) 04/29/2020 2141   HCT 34.7 05/18/2017 1218   HCT 37.4 02/02/2017 1136   PLT 200 05/07/2020 2141   PLT 227 05/18/2017 1218   MCV 95.9 04/21/2020 2141   MCV 87 05/18/2017 1218   MCV 90.6 02/02/2017 1136   MCH 31.3 04/14/2020 2141   MCHC 32.6 04/09/2020 2141   RDW 16.9 (H) 04/20/2020 2141   RDW 16.0 (H) 05/18/2017 1218   RDW 15.7 (H) 02/02/2017 1136   LYMPHSABS 1.1 05/01/2020 2141  LYMPHSABS 1.8 02/02/2017 1136   MONOABS 0.7 05/02/2020 2141   MONOABS 0.8 02/02/2017 1136   EOSABS 0.1 05/01/2020 2141   EOSABS 0.3 02/02/2017 1136   BASOSABS 0.0 04/20/2020 2141   BASOSABS 0.1 02/02/2017 1136    BMET    Component Value Date/Time   NA 130 (L) 03/27/2020 0954   NA 143 05/18/2017 1218   NA 143 02/02/2017 1137   K 5.1 03/27/2020 0954   K 3.8 02/02/2017 1137   CL 91 (L) 03/27/2020 0954   CO2 22 03/27/2020 0954   CO2 24 02/02/2017 1137   GLUCOSE 97 03/27/2020 0954   GLUCOSE 74 02/02/2017 1137   BUN 43 (H) 03/27/2020 0954   BUN 76 (HH) 05/18/2017 1218    BUN 63.4 (H) 02/02/2017 1137   CREATININE 7.18 (H) 03/27/2020 0954   CREATININE 5.12 (H) 02/13/2020 1518   CREATININE 3.0 (HH) 02/02/2017 1137   CALCIUM 10.7 (H) 03/27/2020 0954   CALCIUM 9.9 02/02/2017 1137   GFRNONAA 6 (L) 03/27/2020 0954   GFRNONAA 8 (L) 02/13/2020 1518   GFRAA 9 (L) 02/13/2020 1518    COAGS: Lab Results  Component Value Date   INR 1.2 03/14/2020   INR 1.1 03/28/2019   INR 1.2 03/23/2019     ASSESSMENT/PLAN: This is a 75 y.o. female status post right common femoral endarterectomy with Dacron patch angioplasty and right femoral to anterior tibial artery bypass with PTFE graft.. She has obvious infection of her right groin wound distal wound appears well-healed. I have discussed with the patient taking her to the operating room tonight to open the groin wound drain the purulence and debride any necrotic tissue and likely packed the wound. I discussed with her that she is at high risk to lose both a dacryon patch and the PTFE bypass graft. She will need broad-spectrum antibiotics. We will consult both the nephrologist in the morning for further care of the patient with dialysis scheduled on Tuesday.  Makenzye Troutman C. Donzetta Matters, MD [facility] Office: [phone] Pager: [phone]

## 2020-04-23 ENCOUNTER — Encounter (HOSPITAL_COMMUNITY): Payer: Self-pay | Admitting: Vascular Surgery

## 2020-04-23 ENCOUNTER — Emergency Department (HOSPITAL_COMMUNITY): Payer: Medicare Other | Admitting: Anesthesiology

## 2020-04-23 DIAGNOSIS — Q278 Other specified congenital malformations of peripheral vascular system: Secondary | ICD-10-CM | POA: Diagnosis not present

## 2020-04-23 DIAGNOSIS — I272 Pulmonary hypertension, unspecified: Secondary | ICD-10-CM | POA: Diagnosis not present

## 2020-04-23 DIAGNOSIS — T8131XA Disruption of external operation (surgical) wound, not elsewhere classified, initial encounter: Secondary | ICD-10-CM | POA: Diagnosis not present

## 2020-04-23 DIAGNOSIS — T8189XA Other complications of procedures, not elsewhere classified, initial encounter: Secondary | ICD-10-CM | POA: Diagnosis present

## 2020-04-23 DIAGNOSIS — E559 Vitamin D deficiency, unspecified: Secondary | ICD-10-CM | POA: Diagnosis not present

## 2020-04-23 DIAGNOSIS — E1122 Type 2 diabetes mellitus with diabetic chronic kidney disease: Secondary | ICD-10-CM | POA: Diagnosis present

## 2020-04-23 DIAGNOSIS — Z66 Do not resuscitate: Secondary | ICD-10-CM | POA: Diagnosis not present

## 2020-04-23 DIAGNOSIS — I639 Cerebral infarction, unspecified: Secondary | ICD-10-CM | POA: Diagnosis not present

## 2020-04-23 DIAGNOSIS — I132 Hypertensive heart and chronic kidney disease with heart failure and with stage 5 chronic kidney disease, or end stage renal disease: Secondary | ICD-10-CM | POA: Diagnosis not present

## 2020-04-23 DIAGNOSIS — L8990 Pressure ulcer of unspecified site, unspecified stage: Secondary | ICD-10-CM | POA: Diagnosis not present

## 2020-04-23 DIAGNOSIS — T827XXA Infection and inflammatory reaction due to other cardiac and vascular devices, implants and grafts, initial encounter: Secondary | ICD-10-CM | POA: Diagnosis present

## 2020-04-23 DIAGNOSIS — Z8051 Family history of malignant neoplasm of kidney: Secondary | ICD-10-CM | POA: Diagnosis not present

## 2020-04-23 DIAGNOSIS — Z808 Family history of malignant neoplasm of other organs or systems: Secondary | ICD-10-CM | POA: Diagnosis not present

## 2020-04-23 DIAGNOSIS — E785 Hyperlipidemia, unspecified: Secondary | ICD-10-CM | POA: Diagnosis present

## 2020-04-23 DIAGNOSIS — I739 Peripheral vascular disease, unspecified: Secondary | ICD-10-CM | POA: Diagnosis not present

## 2020-04-23 DIAGNOSIS — Z89421 Acquired absence of other right toe(s): Secondary | ICD-10-CM | POA: Diagnosis not present

## 2020-04-23 DIAGNOSIS — E43 Unspecified severe protein-calorie malnutrition: Secondary | ICD-10-CM | POA: Diagnosis not present

## 2020-04-23 DIAGNOSIS — I517 Cardiomegaly: Secondary | ICD-10-CM | POA: Diagnosis not present

## 2020-04-23 DIAGNOSIS — R404 Transient alteration of awareness: Secondary | ICD-10-CM | POA: Diagnosis not present

## 2020-04-23 DIAGNOSIS — R040 Epistaxis: Secondary | ICD-10-CM | POA: Diagnosis present

## 2020-04-23 DIAGNOSIS — Z515 Encounter for palliative care: Secondary | ICD-10-CM | POA: Diagnosis not present

## 2020-04-23 DIAGNOSIS — Z20822 Contact with and (suspected) exposure to covid-19: Secondary | ICD-10-CM | POA: Diagnosis present

## 2020-04-23 DIAGNOSIS — I255 Ischemic cardiomyopathy: Secondary | ICD-10-CM | POA: Diagnosis present

## 2020-04-23 DIAGNOSIS — Z9889 Other specified postprocedural states: Secondary | ICD-10-CM | POA: Diagnosis not present

## 2020-04-23 DIAGNOSIS — R69 Illness, unspecified: Secondary | ICD-10-CM | POA: Diagnosis not present

## 2020-04-23 DIAGNOSIS — G319 Degenerative disease of nervous system, unspecified: Secondary | ICD-10-CM | POA: Diagnosis not present

## 2020-04-23 DIAGNOSIS — N186 End stage renal disease: Secondary | ICD-10-CM | POA: Diagnosis not present

## 2020-04-23 DIAGNOSIS — E11649 Type 2 diabetes mellitus with hypoglycemia without coma: Secondary | ICD-10-CM | POA: Diagnosis not present

## 2020-04-23 DIAGNOSIS — L89311 Pressure ulcer of right buttock, stage 1: Secondary | ICD-10-CM | POA: Diagnosis present

## 2020-04-23 DIAGNOSIS — Z7189 Other specified counseling: Secondary | ICD-10-CM | POA: Diagnosis not present

## 2020-04-23 DIAGNOSIS — D631 Anemia in chronic kidney disease: Secondary | ICD-10-CM | POA: Diagnosis present

## 2020-04-23 DIAGNOSIS — I509 Heart failure, unspecified: Secondary | ICD-10-CM | POA: Diagnosis not present

## 2020-04-23 DIAGNOSIS — T8149XA Infection following a procedure, other surgical site, initial encounter: Secondary | ICD-10-CM | POA: Diagnosis not present

## 2020-04-23 DIAGNOSIS — E1151 Type 2 diabetes mellitus with diabetic peripheral angiopathy without gangrene: Secondary | ICD-10-CM | POA: Diagnosis present

## 2020-04-23 DIAGNOSIS — I2510 Atherosclerotic heart disease of native coronary artery without angina pectoris: Secondary | ICD-10-CM | POA: Diagnosis present

## 2020-04-23 DIAGNOSIS — J90 Pleural effusion, not elsewhere classified: Secondary | ICD-10-CM | POA: Diagnosis not present

## 2020-04-23 DIAGNOSIS — I6389 Other cerebral infarction: Secondary | ICD-10-CM | POA: Diagnosis not present

## 2020-04-23 DIAGNOSIS — E1129 Type 2 diabetes mellitus with other diabetic kidney complication: Secondary | ICD-10-CM | POA: Diagnosis not present

## 2020-04-23 DIAGNOSIS — R4182 Altered mental status, unspecified: Secondary | ICD-10-CM | POA: Diagnosis not present

## 2020-04-23 DIAGNOSIS — J9811 Atelectasis: Secondary | ICD-10-CM | POA: Diagnosis not present

## 2020-04-23 DIAGNOSIS — D509 Iron deficiency anemia, unspecified: Secondary | ICD-10-CM | POA: Diagnosis not present

## 2020-04-23 DIAGNOSIS — I447 Left bundle-branch block, unspecified: Secondary | ICD-10-CM | POA: Diagnosis not present

## 2020-04-23 DIAGNOSIS — I5042 Chronic combined systolic (congestive) and diastolic (congestive) heart failure: Secondary | ICD-10-CM | POA: Diagnosis not present

## 2020-04-23 DIAGNOSIS — Y832 Surgical operation with anastomosis, bypass or graft as the cause of abnormal reaction of the patient, or of later complication, without mention of misadventure at the time of the procedure: Secondary | ICD-10-CM | POA: Diagnosis present

## 2020-04-23 DIAGNOSIS — I361 Nonrheumatic tricuspid (valve) insufficiency: Secondary | ICD-10-CM | POA: Diagnosis not present

## 2020-04-23 DIAGNOSIS — N250 Renal osteodystrophy: Secondary | ICD-10-CM | POA: Diagnosis not present

## 2020-04-23 DIAGNOSIS — I5040 Unspecified combined systolic (congestive) and diastolic (congestive) heart failure: Secondary | ICD-10-CM | POA: Diagnosis not present

## 2020-04-23 DIAGNOSIS — N2581 Secondary hyperparathyroidism of renal origin: Secondary | ICD-10-CM | POA: Diagnosis present

## 2020-04-23 DIAGNOSIS — I9789 Other postprocedural complications and disorders of the circulatory system, not elsewhere classified: Secondary | ICD-10-CM | POA: Diagnosis not present

## 2020-04-23 DIAGNOSIS — Z992 Dependence on renal dialysis: Secondary | ICD-10-CM | POA: Diagnosis not present

## 2020-04-23 DIAGNOSIS — G9341 Metabolic encephalopathy: Secondary | ICD-10-CM | POA: Diagnosis present

## 2020-04-23 DIAGNOSIS — E8889 Other specified metabolic disorders: Secondary | ICD-10-CM | POA: Diagnosis present

## 2020-04-23 DIAGNOSIS — I340 Nonrheumatic mitral (valve) insufficiency: Secondary | ICD-10-CM | POA: Diagnosis not present

## 2020-04-23 LAB — BASIC METABOLIC PANEL
Anion gap: 16 — ABNORMAL HIGH (ref 5–15)
BUN: 40 mg/dL — ABNORMAL HIGH (ref 8–23)
CO2: 25 mmol/L (ref 22–32)
Calcium: 7.8 mg/dL — ABNORMAL LOW (ref 8.9–10.3)
Chloride: 94 mmol/L — ABNORMAL LOW (ref 98–111)
Creatinine, Ser: 4.43 mg/dL — ABNORMAL HIGH (ref 0.44–1.00)
GFR, Estimated: 10 mL/min — ABNORMAL LOW (ref >60)
Glucose, Bld: 89 mg/dL (ref 70–99)
Potassium: 3.1 mmol/L — ABNORMAL LOW (ref 3.5–5.1)
Sodium: 135 mmol/L (ref 135–145)

## 2020-04-23 LAB — CBC
HCT: 28.3 % — ABNORMAL LOW (ref 36.0–46.0)
Hemoglobin: 9 g/dL — ABNORMAL LOW (ref 12.0–15.0)
MCH: 30.7 pg (ref 26.0–34.0)
MCHC: 31.8 g/dL (ref 30.0–36.0)
MCV: 96.6 fL (ref 80.0–100.0)
Platelets: 206 10*3/uL (ref 150–400)
RBC: 2.93 MIL/uL — ABNORMAL LOW (ref 3.87–5.11)
RDW: 16.9 % — ABNORMAL HIGH (ref 11.5–15.5)
WBC: 13.6 10*3/uL — ABNORMAL HIGH (ref 4.0–10.5)
nRBC: 0 % (ref 0.0–0.2)

## 2020-04-23 LAB — HEMOGLOBIN A1C
Hgb A1c MFr Bld: 5 % (ref 4.8–5.6)
Mean Plasma Glucose: 96.8 mg/dL

## 2020-04-23 LAB — LACTIC ACID, PLASMA: Lactic Acid, Venous: 1.1 mmol/L (ref 0.5–1.9)

## 2020-04-23 LAB — GLUCOSE, CAPILLARY
Glucose-Capillary: 78 mg/dL (ref 70–99)
Glucose-Capillary: 84 mg/dL (ref 70–99)
Glucose-Capillary: 87 mg/dL (ref 70–99)
Glucose-Capillary: 174 mg/dL — ABNORMAL HIGH (ref 70–99)
Glucose-Capillary: 180 mg/dL — ABNORMAL HIGH (ref 70–99)
Glucose-Capillary: 159 mg/dL — ABNORMAL HIGH (ref 70–99)

## 2020-04-23 LAB — PREALBUMIN: Prealbumin: <5 mg/dL — ABNORMAL LOW (ref 18–38)

## 2020-04-23 MED ORDER — TRAZODONE 25 MG HALF TABLET
25.0000 mg | ORAL_TABLET | Freq: Every day | ORAL | Status: DC
  Administered 2020-04-25 – 2020-04-30 (×6): 50 mg via ORAL
  Administered 2020-05-01: 25 mg via ORAL
  Administered 2020-05-02: 50 mg via ORAL
  Filled 2020-04-23 (×11): qty 2

## 2020-04-23 MED ORDER — ONDANSETRON HCL 4 MG/2ML IJ SOLN
4.0000 mg | Freq: Four times a day (QID) | INTRAMUSCULAR | Status: DC | PRN
  Filled 2020-04-23: qty 2

## 2020-04-23 MED ORDER — ALLOPURINOL 100 MG PO TABS
100.0000 mg | ORAL_TABLET | Freq: Two times a day (BID) | ORAL | Status: DC
  Administered 2020-04-23 – 2020-05-11 (×31): 100 mg via ORAL
  Filled 2020-04-23 (×33): qty 1

## 2020-04-23 MED ORDER — ONDANSETRON HCL 4 MG/2ML IJ SOLN: 4.0000 mg | Freq: Four times a day (QID) | INTRAMUSCULAR | Status: DC | PRN

## 2020-04-23 MED ORDER — LACTATED RINGERS IV SOLN: INTRAVENOUS | Status: DC | PRN

## 2020-04-23 MED ORDER — INSULIN ASPART 100 UNIT/ML ~~LOC~~ SOLN
3.0000 [IU] | Freq: Three times a day (TID) | SUBCUTANEOUS | Status: DC
  Administered 2020-04-23 – 2020-04-30 (×5): 3 [IU] via SUBCUTANEOUS
  Filled 2020-04-23: qty 0.03

## 2020-04-23 MED ORDER — CEFAZOLIN SODIUM-DEXTROSE 2-3 GM-%(50ML) IV SOLR
INTRAVENOUS | Status: DC | PRN
  Administered 2020-04-23: 2 g via INTRAVENOUS

## 2020-04-23 MED ORDER — HEPARIN SODIUM (PORCINE) 5000 UNIT/ML IJ SOLN
5000.0000 [IU] | Freq: Three times a day (TID) | INTRAMUSCULAR | Status: DC
  Administered 2020-04-23 – 2020-05-05 (×34): 5000 [IU] via SUBCUTANEOUS
  Filled 2020-04-23 (×34): qty 1

## 2020-04-23 MED ORDER — MIDAZOLAM HCL 5 MG/5ML IJ SOLN
INTRAMUSCULAR | Status: DC | PRN
  Administered 2020-04-23: 1 mg via INTRAVENOUS

## 2020-04-23 MED ORDER — COLLAGENASE 250 UNIT/GM EX OINT
TOPICAL_OINTMENT | Freq: Every day | CUTANEOUS | Status: DC
  Administered 2020-05-04: 1 via TOPICAL
  Filled 2020-04-23: qty 30

## 2020-04-23 MED ORDER — INSULIN ASPART 100 UNIT/ML ~~LOC~~ SOLN
0.0000 [IU] | Freq: Three times a day (TID) | SUBCUTANEOUS | Status: DC
  Administered 2020-04-23 – 2020-04-24 (×5): 2 [IU] via SUBCUTANEOUS
  Administered 2020-04-29: 1 [IU] via SUBCUTANEOUS
  Filled 2020-04-23: qty 0.09

## 2020-04-23 MED ORDER — ONDANSETRON HCL 4 MG/2ML IJ SOLN
INTRAMUSCULAR | Status: DC | PRN
  Administered 2020-04-23: 4 mg via INTRAVENOUS

## 2020-04-23 MED ORDER — CEFAZOLIN SODIUM 1 G IJ SOLR
INTRAMUSCULAR | Status: AC
  Filled 2020-04-23: qty 20

## 2020-04-23 MED ORDER — ASPIRIN EC 81 MG PO TBEC
81.0000 mg | DELAYED_RELEASE_TABLET | Freq: Every day | ORAL | Status: DC | PRN
  Filled 2020-04-23: qty 1

## 2020-04-23 MED ORDER — OXYCODONE-ACETAMINOPHEN 5-325 MG PO TABS
1.0000 | ORAL_TABLET | ORAL | Status: DC | PRN
  Administered 2020-04-24 – 2020-04-26 (×2): 1 via ORAL
  Administered 2020-04-30 – 2020-05-08 (×3): 2 via ORAL
  Filled 2020-04-23: qty 2
  Filled 2020-04-23 (×2): qty 1
  Filled 2020-04-23 (×2): qty 2
  Filled 2020-04-23: qty 1

## 2020-04-23 MED ORDER — FENTANYL CITRATE (PF) 100 MCG/2ML IJ SOLN
INTRAMUSCULAR | Status: AC
  Filled 2020-04-23: qty 2

## 2020-04-23 MED ORDER — 0.9 % SODIUM CHLORIDE (POUR BTL) OPTIME
TOPICAL | Status: DC | PRN
  Administered 2020-04-23: 1000 mL

## 2020-04-23 MED ORDER — DEXAMETHASONE SODIUM PHOSPHATE 10 MG/ML IJ SOLN
INTRAMUSCULAR | Status: DC | PRN
  Administered 2020-04-23: 4 mg via INTRAVENOUS

## 2020-04-23 MED ORDER — DOCUSATE SODIUM 100 MG PO CAPS
100.0000 mg | ORAL_CAPSULE | Freq: Two times a day (BID) | ORAL | Status: DC
  Administered 2020-04-25 – 2020-05-11 (×26): 100 mg via ORAL
  Filled 2020-04-23 (×33): qty 1

## 2020-04-23 MED ORDER — PANTOPRAZOLE SODIUM 40 MG PO TBEC
40.0000 mg | DELAYED_RELEASE_TABLET | Freq: Every day | ORAL | Status: DC
  Administered 2020-04-23 – 2020-05-11 (×15): 40 mg via ORAL
  Filled 2020-04-23 (×16): qty 1

## 2020-04-23 MED ORDER — SUCCINYLCHOLINE CHLORIDE 20 MG/ML IJ SOLN
INTRAMUSCULAR | Status: DC | PRN
  Administered 2020-04-23: 100 mg via INTRAVENOUS

## 2020-04-23 MED ORDER — PSYLLIUM 51.7 % PO PACK: PACK | Freq: Every day | ORAL | Status: DC | PRN

## 2020-04-23 MED ORDER — LIDOCAINE HCL (CARDIAC) PF 100 MG/5ML IV SOSY
PREFILLED_SYRINGE | INTRAVENOUS | Status: DC | PRN
  Administered 2020-04-23: 60 mg via INTRATRACHEAL

## 2020-04-23 MED ORDER — FENTANYL CITRATE (PF) 100 MCG/2ML IJ SOLN
25.0000 ug | INTRAMUSCULAR | Status: DC | PRN
  Administered 2020-04-23 (×2): 25 ug via INTRAVENOUS

## 2020-04-23 MED ORDER — PHENYLEPHRINE HCL-NACL 10-0.9 MG/250ML-% IV SOLN
INTRAVENOUS | Status: DC | PRN
  Administered 2020-04-23: 30 ug/min via INTRAVENOUS

## 2020-04-23 MED ORDER — HYDRALAZINE HCL 20 MG/ML IJ SOLN
5.0000 mg | INTRAMUSCULAR | Status: DC | PRN
  Filled 2020-04-23: qty 0.25

## 2020-04-23 MED ORDER — GUAIFENESIN-DM 100-10 MG/5ML PO SYRP
15.0000 mL | ORAL_SOLUTION | ORAL | Status: DC | PRN
  Filled 2020-04-23: qty 15

## 2020-04-23 MED ORDER — MORPHINE SULFATE (PF) 2 MG/ML IV SOLN: 2.0000 mg | INTRAVENOUS | Status: DC | PRN

## 2020-04-23 MED ORDER — SEVELAMER CARBONATE 800 MG PO TABS
1600.0000 mg | ORAL_TABLET | Freq: Three times a day (TID) | ORAL | Status: DC
  Administered 2020-04-23 – 2020-05-08 (×22): 1600 mg via ORAL
  Filled 2020-04-23 (×27): qty 2

## 2020-04-23 MED ORDER — CINACALCET HCL 30 MG PO TABS
30.0000 mg | ORAL_TABLET | Freq: Every day | ORAL | Status: DC
  Administered 2020-04-23 – 2020-05-11 (×15): 30 mg via ORAL
  Filled 2020-04-23 (×17): qty 1

## 2020-04-23 MED ORDER — NEPRO/CARBSTEADY PO LIQD
Freq: Two times a day (BID) | ORAL | Status: DC
  Administered 2020-04-25 – 2020-05-11 (×11): 237 mL via ORAL
  Filled 2020-04-23: qty 237

## 2020-04-23 MED ORDER — HYDRALAZINE HCL 50 MG PO TABS
75.0000 mg | ORAL_TABLET | Freq: Three times a day (TID) | ORAL | Status: DC
  Administered 2020-04-23 – 2020-04-30 (×7): 75 mg via ORAL
  Filled 2020-04-23 (×17): qty 1

## 2020-04-23 MED ORDER — RENA-VITE PO TABS
1.0000 | ORAL_TABLET | Freq: Every day | ORAL | Status: DC
  Administered 2020-04-23 – 2020-05-11 (×17): 1 via ORAL
  Filled 2020-04-23 (×19): qty 1

## 2020-04-23 MED ORDER — POTASSIUM CHLORIDE CRYS ER 20 MEQ PO TBCR
20.0000 meq | EXTENDED_RELEASE_TABLET | Freq: Once | ORAL | Status: AC
  Administered 2020-04-23: 40 meq via ORAL
  Filled 2020-04-23: qty 2

## 2020-04-23 MED ORDER — FENTANYL CITRATE (PF) 250 MCG/5ML IJ SOLN
INTRAMUSCULAR | Status: DC | PRN
  Administered 2020-04-23 (×2): 50 ug via INTRAVENOUS

## 2020-04-23 MED ORDER — VANCOMYCIN HCL 1250 MG/250ML IV SOLN
1250.0000 mg | Freq: Once | INTRAVENOUS | Status: DC
  Filled 2020-04-23: qty 250

## 2020-04-23 MED ORDER — INSULIN ASPART 100 UNIT/ML ~~LOC~~ SOLN
SUBCUTANEOUS | Status: AC
  Filled 2020-04-23: qty 1

## 2020-04-23 MED ORDER — DARBEPOETIN ALFA 60 MCG/0.3ML IJ SOSY: 60.0000 ug | PREFILLED_SYRINGE | INTRAMUSCULAR | Status: DC

## 2020-04-23 MED ORDER — OXYCODONE HCL 5 MG/5ML PO SOLN: 5.0000 mg | Freq: Once | ORAL | Status: DC | PRN

## 2020-04-23 MED ORDER — PRESERVISION AREDS 2 PO CAPS: ORAL_CAPSULE | Freq: Every day | ORAL | Status: DC

## 2020-04-23 MED ORDER — EPHEDRINE SULFATE 50 MG/ML IJ SOLN
INTRAMUSCULAR | Status: DC | PRN
  Administered 2020-04-23: 5 mg via INTRAVENOUS

## 2020-04-23 MED ORDER — ISOSORBIDE DINITRATE 10 MG PO TABS
10.0000 mg | ORAL_TABLET | Freq: Three times a day (TID) | ORAL | Status: DC
  Administered 2020-04-23 – 2020-05-11 (×38): 10 mg via ORAL
  Filled 2020-04-23 (×43): qty 1

## 2020-04-23 MED ORDER — PHENOL 1.4 % MT LIQD
1.0000 | OROMUCOSAL | Status: DC | PRN
  Filled 2020-04-23: qty 177

## 2020-04-23 MED ORDER — CYCLOBENZAPRINE HCL 10 MG PO TABS
5.0000 mg | ORAL_TABLET | Freq: Three times a day (TID) | ORAL | Status: DC | PRN
  Administered 2020-04-24: 5 mg via ORAL
  Filled 2020-04-23: qty 1

## 2020-04-23 MED ORDER — ALUM & MAG HYDROXIDE-SIMETH 200-200-20 MG/5ML PO SUSP
15.0000 mL | ORAL | Status: DC | PRN
  Filled 2020-04-23: qty 30

## 2020-04-23 MED ORDER — PIPERACILLIN-TAZOBACTAM IN DEX 2-0.25 GM/50ML IV SOLN
2.2500 g | Freq: Three times a day (TID) | INTRAVENOUS | Status: DC
  Administered 2020-04-23 – 2020-04-25 (×7): 2.25 g via INTRAVENOUS
  Filled 2020-04-23 (×8): qty 50

## 2020-04-23 MED ORDER — ATORVASTATIN CALCIUM 10 MG PO TABS
10.0000 mg | ORAL_TABLET | Freq: Every day | ORAL | Status: DC
  Administered 2020-04-23 – 2020-05-11 (×15): 10 mg via ORAL
  Filled 2020-04-23 (×16): qty 1

## 2020-04-23 MED ORDER — PROPOFOL 10 MG/ML IV BOLUS
INTRAVENOUS | Status: DC | PRN
  Administered 2020-04-23: 100 mg via INTRAVENOUS

## 2020-04-23 MED ORDER — BISACODYL 10 MG RE SUPP
10.0000 mg | RECTAL | Status: DC | PRN
  Administered 2020-05-08 (×2): 10 mg via RECTAL
  Filled 2020-04-23 (×3): qty 1

## 2020-04-23 MED ORDER — PIPERACILLIN-TAZOBACTAM 3.375 G IVPB: 3.3750 g | Freq: Three times a day (TID) | INTRAVENOUS | Status: DC

## 2020-04-23 MED ORDER — VANCOMYCIN HCL IN DEXTROSE 750-5 MG/150ML-% IV SOLN
750.0000 mg | INTRAVENOUS | Status: DC
  Filled 2020-04-23: qty 150

## 2020-04-23 MED ORDER — INSULIN ASPART 100 UNIT/ML ~~LOC~~ SOLN
0.0000 [IU] | Freq: Every day | SUBCUTANEOUS | Status: DC
  Filled 2020-04-23: qty 0.05

## 2020-04-23 MED ORDER — OXYCODONE HCL 5 MG PO TABS: 5.0000 mg | ORAL_TABLET | Freq: Once | ORAL | Status: DC | PRN

## 2020-04-23 NOTE — Progress Notes (Signed)
Pharmacy Antibiotic Note  [name] is a 75 y.o. female admitted on 04/29/2020 with wound infection s/p I & D.  Pharmacy has been consulted for Vancomycin  dosing.  Plan: Vancomycin 1250 mg IV now, then 750 mg IV after each HD    Temp (24hrs), Avg:98.5 F (36.9 C), Min:98.5 F (36.9 C), Max:98.5 F (36.9 C)  Recent Labs  Lab 04/25/2020 2141  WBC 12.6*  CREATININE 4.41*  LATICACIDVEN 1.3    Estimated Creatinine Clearance: 9.7 mL/min (A) (by C-G formula based on SCr of 4.41 mg/dL (H)).    Allergies  Allergen Reactions  . Atenolol Rash and Other (See Comments)    Exacerbates gout   . Adhesive [Tape] Other (See Comments)    SKIN IS VERY SENSITIVE AND BRUISES AND TEARS EASILY; PLEASE USE AN ALTERNATIVE TO TAPE!!  . Contrast Media [Iodinated Diagnostic Agents] Rash  . Iohexol Rash  . Latex Rash    Caryl Pina 04/23/2020 1:42 AM

## 2020-04-23 NOTE — Progress Notes (Signed)
Right foot wound treated with Santyl and redressed per patient request.  Per patient, this is changed daily with the wound care team at her facility.

## 2020-04-23 NOTE — Anesthesia Preprocedure Evaluation (Addendum)
Anesthesia Evaluation  Patient identified by MRN, date of birth, ID band Patient awake    Reviewed: Allergy & Precautions, NPO status , Patient's Chart, lab work & pertinent test results  History of Anesthesia Complications (+) PONV  Airway Mallampati: II  TM Distance: >3 FB     Dental   Pulmonary shortness of breath,    breath sounds clear to auscultation       Cardiovascular hypertension, + CAD, + Past MI, + Peripheral Vascular Disease and +CHF  + dysrhythmias  Rhythm:Regular Rate:Normal     Neuro/Psych  Headaches,    GI/Hepatic Neg liver ROS,   Endo/Other  diabetes  Renal/GU Renal diseasenegative Renal ROS     Musculoskeletal  (+) Arthritis ,   Abdominal   Peds  Hematology   Anesthesia Other Findings   Reproductive/Obstetrics                            Anesthesia Physical Anesthesia Plan  ASA: III  Anesthesia Plan: General   Post-op Pain Management:    Induction: Intravenous  PONV Risk Score and Plan:   Airway Management Planned: LMA  Additional Equipment:   Intra-op Plan:   Post-operative Plan: Extubation in OR  Informed Consent: I have reviewed the patients History and Physical, chart, labs and discussed the procedure including the risks, benefits and alternatives for the proposed anesthesia with the patient or authorized representative who has indicated his/her understanding and acceptance.     Dental advisory given  Plan Discussed with: CRNA and Anesthesiologist  Anesthesia Plan Comments:         Anesthesia Quick Evaluation

## 2020-04-23 NOTE — Anesthesia Procedure Notes (Signed)
Procedure Name: Intubation Date/Time: 04/23/2020 12:52 AM Performed by: Clovis Cao, CRNA Pre-anesthesia Checklist: Patient identified, Emergency Drugs available, Suction available, Patient being monitored and Timeout performed Patient Re-evaluated:Patient Re-evaluated prior to induction Oxygen Delivery Method: Circle system utilized Preoxygenation: Pre-oxygenation with 100% oxygen Induction Type: IV induction, Rapid sequence and Cricoid Pressure applied Laryngoscope Size: Miller and 2 Grade View: Grade I Tube type: Oral Tube size: 7.5 mm Number of attempts: 1 Airway Equipment and Method: Stylet Placement Confirmation: ETT inserted through vocal cords under direct vision,  positive ETCO2 and breath sounds checked- equal and bilateral Secured at: 21 cm Tube secured with: Tape Dental Injury: Teeth and Oropharynx as per pre-operative assessment

## 2020-04-23 NOTE — Anesthesia Preprocedure Evaluation (Signed)
Anesthesia Evaluation  Patient identified by MRN, date of birth, ID band Patient awake    Reviewed: Allergy & Precautions, H&P , NPO status , Patient's Chart, lab work & pertinent test results  History of Anesthesia Complications (+) PONV  Airway Mallampati: II   Neck ROM: full    Dental   Pulmonary shortness of breath, asthma ,    breath sounds clear to auscultation       Cardiovascular hypertension, + CAD, + Past MI, + CABG, + Peripheral Vascular Disease and +CHF   Rhythm:regular Rate:Normal     Neuro/Psych  Headaches,    GI/Hepatic   Endo/Other  diabetes, Type 2  Renal/GU Renal InsufficiencyRenal disease     Musculoskeletal  (+) Arthritis ,   Abdominal   Peds  Hematology  (+) Blood dyscrasia, anemia ,   Anesthesia Other Findings   Reproductive/Obstetrics                             Anesthesia Physical Anesthesia Plan  ASA: III  Anesthesia Plan: General   Post-op Pain Management:    Induction: Intravenous  PONV Risk Score and Plan: 4 or greater and Ondansetron and Treatment may vary due to age or medical condition  Airway Management Planned: Oral ETT  Additional Equipment:   Intra-op Plan:   Post-operative Plan: Extubation in OR  Informed Consent: I have reviewed the patients History and Physical, chart, labs and discussed the procedure including the risks, benefits and alternatives for the proposed anesthesia with the patient or authorized representative who has indicated his/her understanding and acceptance.     Dental advisory given  Plan Discussed with: CRNA, Anesthesiologist and Surgeon  Anesthesia Plan Comments:         Anesthesia Quick Evaluation

## 2020-04-23 NOTE — Transfer of Care (Signed)
Immediate Anesthesia Transfer of Care Note  Patient: [name]  Procedure(s) Performed: IRRIGATION AND DEBRIDEMENT, GROIN WOUND (Right Groin)  Patient Location: PACU  Anesthesia Type:General  Level of Consciousness: drowsy  Airway & Oxygen Therapy: Patient Spontanous Breathing  Post-op Assessment: Report given to RN and Post -op Vital signs reviewed and stable  Post vital signs: Reviewed and stable  Last Vitals:  Vitals Value Taken Time  BP 87/62 04/23/20 0133  Temp    Pulse 83 04/23/20 0134  Resp 26 04/23/20 0134  SpO2 94 % 04/23/20 0134  Vitals shown include unvalidated device data.  Last Pain:  Vitals:   04/23/20 0000  TempSrc: Oral  PainSc:          Complications: No complications documented.

## 2020-04-23 NOTE — Op Note (Signed)
    Patient name: [name] MRN: [id number] DOB: March 09, 1946 Sex: female  04/23/2020 Pre-operative Diagnosis: Infected right groin surgical wound Post-operative diagnosis:  Same Surgeon:  Erlene Quan C. Donzetta Matters, MD Procedure Performed:  I&D right groin surgical wound   Indications: 75 year old female with end-stage renal disease and had a wound on her right foot she underwent right common femoral endarterectomy with patch angioplasty and a right common femoral to below-knee popliteal artery bypass with PTFE.  She now has significant infection with draining pus from the right groin and is indicated for the above-noted procedure.  Findings: There was significant purulence in the wound.  Specimens were sent both aerobic anaerobic cultures patient had received Ancef prior to cultures.  The dacryon graft was exposed throughout most of its course the PTFE bypass graft was exposed for several centimeters and then appeared to be fully incorporated distally in the wound.  There was a large potential space medially on the thigh.  After thoroughly irrigating all of the wound I did expose under the inguinal ligament up to the anterior superior iliac spine and the sartorius muscle does appear viable in case of the need for flap coverage.  A Betadine soaked Kerlix was placed over the graft and in the potential space this was covered with a moistened Kerlix for a total of 2 total Kerlix in the wound.   Procedure:  The patient was identified in the holding area and taken to the operating room where she is placed supine operative when general anesthesia induced.  She was sterilely prepped and draped in the right groin in the usual fashion antibiotics were ministered timeout was called.  We open the existing groin wound to the total of the previous groin exposure incision.  The graft was immediately visible.  There was some fibrinous material which was removed.  Specimens were sent of the existing purulence.  We thoroughly  irrigated the wound.  I exposed up onto the inguinal ligament to the anterior superior spine to identify the sartorius muscle.  The graft was exposed proximally as was the dacryon patch angioplasty graft but the PTFE graft did appear well incorporated distally.  We again thoroughly irrigated the wound obtain hemostasis and packed with a Betadine soaked Kerlix overlying the wound and the potential spaces placed a moistened Kerlix above this for a total of 2.  ABD pads were placed followed by mesh panties.  Patient was then awakened from anesthesia having tolerated procedure well any complication.  All counts were correct at completion.  Specimen: Aerobic/anaerobic culture   EBL: 100 cc  Margeaux Swantek C. Donzetta Matters, MD [facility] Office: [phone] Pager: [phone]

## 2020-04-23 NOTE — Consult Note (Signed)
[facility] Renal Consultation Note  Indication for Consultation:  Management of ESRD/hemodialysis; anemia, hypertension/volume and secondary hyperparathyroidism  HPI: [name] is a 75 y.o. female  with ESRD on HD TTS at [facility] center.  PMH=  CAD s/p CABG, ischemic CM, combinded systolic/diastolic HF, HTN, DMT2, Hx breast cancer s/p lumpectomy/radiation and PVD s/p revascularization on L with toe amputations, right lower extremity critical limb ischemia with 03/16/2020 right external iliac and common femoral endarterectomy with right femoral to anterior tibial artery bypass.  Now admitted by [facility] with infected right groin surgical wound today had I&D of right groin wound.  She is compliant of hemodialysis last dialysis .  Seen in room postop has minimal groin discomfort     Past Medical History:  Diagnosis Date  . Allergy   . Anterior dislocation of left shoulder 05/27/2019  . Arthritis    "maybe in my lower back" (08/27/2012)  . 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 Chronic combined systolic and diastolic CHF (congestive heart failure) ([place])   . CKD (chronic kidney disease), stage IV ([place])   . Coronary artery disease    a. NSTEMI in 05/2009 s/p CABG (LIMA-LAD, SVG-diagonal, SVG-OM1/OM 2).   Marland Kitchen Dyspnea    with exertion  . Family history of anesthesia complication    Mom has a hard time to wake up  . Foot ulcer ([place])    "I've had them on both feet" (08/27/2012)  . Gouty arthritis    "right index finger" (08/27/2012)  . History of radiation therapy 06/09/13-07/06/13   left breast 50Gy  . Hyperlipidemia   . Hypertension   . Infection    right second toe  . Ischemic cardiomyopathy    a. 2011: EF 40-45%. b. EF 55-60% in 04/2017 but shortly after as inpatient was 45-50%.  Marland Kitchen LBBB (left bundle branch block)   . Mitral regurgitation   . Neuropathy    Hx; of B/L feet  . NSTEMI (non-ST elevated myocardial infarction) ([place])  05/23/2009  . Osteomyelitis of foot ([place])   . PAD (peripheral artery disease) ([facility])    a. LE PAD (patient previously elected hold off L fem-pop), prev followed by Dr. Bridgett Larsson  . PONV (postoperative nausea and vomiting)   . Sinus headache    "occasionally" (08/27/2012)  . Type II diabetes mellitus ([place])   . Vitamin D deficiency     Past Surgical History:  Procedure Laterality Date  . ABDOMINAL AORTOGRAM W/LOWER EXTREMITY Bilateral 03/25/2019   Procedure: ABDOMINAL AORTOGRAM W/LOWER EXTREMITY;  Surgeon: Marty Heck, MD;  Location: [place] [facility];  Service: Cardiovascular;  Laterality: Bilateral;  . ABDOMINAL AORTOGRAM W/LOWER EXTREMITY Bilateral 02/24/2020   Procedure: ABDOMINAL AORTOGRAM W/LOWER EXTREMITY;  Surgeon: Cherre Robins, MD;  Location: [place] [facility];  Service: Cardiovascular;  Laterality: Bilateral;  . AMPUTATION Right 08/27/2012   Procedure: AMPUTATION RAY;  Surgeon: Newt Minion, MD;  Location: [place];  Service: Orthopedics;  Laterality: Right;  Right Foot 5th Ray Amputation  . AMPUTATION Left 07/08/2013   Procedure: AMPUTATION RAY;  Surgeon: Newt Minion, MD;  Location: [place];  Service: Orthopedics;  Laterality: Left;  Left Foot 2nd Ray Amputation  . AMPUTATION Left 04/06/2019   Procedure: LEFT 5TH RAY AMPUTATION;  Surgeon: Newt Minion, MD;  Location: [place];  Service: Orthopedics;  Laterality: Left;  . AMPUTATION RAY Right 08/27/2012   5th ray/notes 08/27/2012  . AMPUTATION TOE Right 03/06/2017  Procedure: AMPUTATION TOE, INTERPHANGEAL 2ND RIGHT;  Surgeon: [name], DPM;  Location: [place];  Service: Podiatry;  Laterality: Right;  . AV FISTULA PLACEMENT Right 11/11/2017   Procedure: RIGHT RADIOCEPHALIC  ARTERIOVENOUS FISTULA;  Surgeon: [name], MD;  Location: [place];  Service: Vascular;  Laterality: Right;  . BREAST LUMPECTOMY  04/20/2013   with biopsy      DR WAKEFIELD  . BREAST LUMPECTOMY WITH NEEDLE LOCALIZATION AND AXILLARY SENTINEL LYMPH  NODE BX Left 04/20/2013   Procedure: LEFT BREAST WIRE GUIDED LUMPECTOMY AND AXILLARY SENTINEL LYMPH NODE BX;  Surgeon: Rolm Bookbinder, MD;  Location: [place];  Service: General;  Laterality: Left;  . CARDIAC CATHETERIZATION  05/24/2009   Archie Endo 05/24/2009 (08/27/2012)  . CATARACT EXTRACTION W/ INTRAOCULAR LENS  IMPLANT, BILATERAL  2000's  . COLONOSCOPY W/ BIOPSIES AND POLYPECTOMY  2010  . CORONARY ARTERY BYPASS GRAFT  2011   "CABG X4" (08/27/2012)  . DENTAL SURGERY  04/30/11   "1 implant" (08/27/2012)  . DILATION AND CURETTAGE OF UTERUS    . ENDARTERECTOMY FEMORAL Left 03/28/2019   Procedure: ENDARTERECTOMY FEMORAL;  Surgeon: [name], MD;  Location: [place];  Service: Vascular;  Laterality: Left;  . ENDARTERECTOMY FEMORAL Right 03/16/2020   Procedure: RIGHT EXTERNAL ILIAC  ENDARTERECTOMY WITH PATCH ANGIOPLASTY;  Surgeon: [name], MD;  Location: [place];  Service: Vascular;  Laterality: Right;  . EYE SURGERY    . FEMORAL-POPLITEAL BYPASS GRAFT Left 03/28/2019   Procedure: BYPASS GRAFT FEMORAL-POPLITEAL ARTERY;  Surgeon: [name], MD;  Location: [place];  Service: Vascular;  Laterality: Left;  . FEMORAL-POPLITEAL BYPASS GRAFT Right 03/16/2020   Procedure: RIGHT COMMON FEMORAL TO ANTERIOR TIBIAL ARTERY BYPASS GRAFT USING PROPATEN GRAFT;  Surgeon: [name], MD;  Location: [place];  Service: Vascular;  Laterality: Right;  . FINGER SURGERY Right    "index finger; turned out to be gout" (08/27/2012)  . IR FLUORO GUIDE CV LINE RIGHT  11/30/2017  . PATCH ANGIOPLASTY Left 03/28/2019   Procedure: Patch Angioplasty;  Surgeon: [name], MD;  Location: [place];  Service: Vascular;  Laterality: Left;  . SHOULDER CLOSED REDUCTION Left 05/27/2019   Procedure: CLOSED REDUCTION LEFT SHOULDER;  Surgeon: Mcarthur Rossetti, MD;  Location: [facility];  Service: Orthopedics;  Laterality: Left;  . TEE WITHOUT CARDIOVERSION N/A 12/01/2017   Procedure: TRANSESOPHAGEAL ECHOCARDIOGRAM (TEE);  Surgeon:  Lelon Perla, MD;  Location: [facility] ENDOSCOPY;  Service: Cardiovascular;  Laterality: N/A;      Family History  Problem Relation Age of Onset  . Kidney cancer Brother 68  . Hypertension Brother   . Breast cancer Sister 8       LCIS; BRCA negative  . Throat cancer Father 76       smoker  . Breast cancer Sister 57       Lobular breast cancer  . Breast cancer Maternal Aunt        dx in her 37s      reports that she has never smoked. She has never used smokeless tobacco. She reports previous alcohol use. She reports that she does not use drugs.   Allergies  Allergen Reactions  . Atenolol Rash and Other (See Comments)    Exacerbates gout   . Adhesive [Tape] Other (See Comments)    SKIN IS VERY SENSITIVE AND BRUISES AND TEARS EASILY; PLEASE USE AN ALTERNATIVE TO TAPE!!  . Contrast Media [Iodinated Diagnostic Agents] Rash  . Iohexol Rash  .  Latex Rash    Prior to Admission medications   Medication Sig Start Date End Date Taking? Authorizing Provider  allopurinol (ZYLOPRIM) 100 MG tablet TAKE 1 TABLET TWICE DAILY TO PREVENT GOUT 10/28/19   Unk Pinto, MD  ARTIFICIAL TEAR SOLUTION OP Place 1 drop into both eyes 2 (two) times daily as needed (dry eyes). Refresh    [provider]  aspirin EC 81 MG tablet Take 81 mg by mouth daily as needed (Will not take if take Excedrin).     [provider]  atorvastatin (LIPITOR) 10 MG tablet TAKE 1 TABLET DAILY FOR CHOLESTEROL. 03/07/20   Garnet Sierras, NP  b complex-vitamin c-folic acid (NEPHRO-VITE) 0.8 MG TABS tablet Take 1 tablet by mouth daily.  03/19/18   [provider]  bisacodyl (DULCOLAX) 10 MG suppository Place 10 mg rectally as needed for moderate constipation.    [provider]  cinacalcet (SENSIPAR) 30 MG tablet Take 1 tablet (30 mg total) by mouth daily with supper. 04/19/19   Angiulli, Lavon Paganini, PA-C  cyclobenzaprine (FLEXERIL) 5 MG tablet Take 1 tablet (5 mg total) by mouth 3 (three)  times daily as needed for muscle spasms. 06/22/19   Mcarthur Rossetti, MD  docusate sodium (COLACE) 100 MG capsule Take 100 mg by mouth 2 (two) times daily.     [provider]  glucose blood (PRODIGY NO CODING BLOOD GLUC) test strip USE 1 STRIP TO CHECK GLUCOSE THREE TIMES DAILY-DX-E11.9 Patient not taking: Reported on 04/13/2020 11/29/19   Unk Pinto, MD  hydrALAZINE (APRESOLINE) 25 MG tablet Take 75 mg by mouth 3 (three) times daily. Take 3 tablets to = 75 mg TID    [provider]  HYDROcodone-acetaminophen (NORCO/VICODIN) 5-325 MG tablet Take 1 tablet by mouth every 6 (six) hours as needed. 03/27/20   Dagoberto Ligas, PA-C  insulin NPH-regular Human (70-30) 100 UNIT/ML injection Inject 10 Units into the skin 2 (two) times daily with a meal.    [provider]  Insulin Pen Needle (BD PEN NEEDLE NANO U/F) 32G X 4 MM MISC USE TO INJECT TWICE DAILY LANTUS INJECTIONS Patient not taking: Reported on 05/01/2020 01/27/20   Unk Pinto, MD  Insulin Syringe-Needle U-100 (BD INSULIN SYRINGE U/F) 31G X 5/16" 1 ML MISC Administer Insulin      3 x /day      with Meals      (Dx-e11.29) Patient not taking: Reported on 04/07/2020 03/06/20   Unk Pinto, MD  isosorbide dinitrate (ISORDIL) 10 MG tablet Take 10 mg by mouth 3 (three) times daily.    [provider]  METAMUCIL FIBER PO Take 1 capsule by mouth daily as needed (Constipation).     [provider]  Multiple Vitamins-Minerals (PRESERVISION AREDS 2 PO) Take 2 capsules by mouth daily at 12 noon.     [provider]  Nutritional Supplements (NEPRO) LIQD Take by mouth 2 (two) times daily.    [provider]  sevelamer carbonate (RENVELA) 800 MG tablet Take 2 tablets (1,600 mg total) by mouth 3 (three) times daily with meals. 04/19/19   Angiulli, Lavon Paganini, PA-C  traZODone (DESYREL) 50 MG tablet Take 25-50 mg by mouth at bedtime.    [provider]    XNA:TFTD & mag  hydroxide-simeth, aspirin EC, bisacodyl, cyclobenzaprine, guaiFENesin-dextromethorphan, hydrALAZINE, morphine injection, ondansetron, oxyCODONE-acetaminophen, phenol   Results for orders placed or performed during the hospital encounter of 04/20/2020 (from the past 48 hour(s))  Comprehensive metabolic panel  Status: Abnormal   Collection Time: 04/20/2020  9:41 PM  Result Value Ref Range   Sodium 136 135 - 145 mmol/L   Potassium 3.2 (L) 3.5 - 5.1 mmol/L   Chloride 96 (L) 98 - 111 mmol/L   CO2 26 22 - 32 mmol/L   Glucose, Bld 76 70 - 99 mg/dL    Comment: Glucose reference range applies only to samples taken after fasting for at least 8 hours.   BUN 40 (H) 8 - 23 mg/dL   Creatinine, Ser 4.41 (H) 0.44 - 1.00 mg/dL   Calcium 7.8 (L) 8.9 - 10.3 mg/dL   Total Protein 4.9 (L) 6.5 - 8.1 g/dL   Albumin 2.2 (L) 3.5 - 5.0 g/dL   AST 13 (L) 15 - 41 U/L   ALT <5 0 - 44 U/L    Comment: REPEATED TO VERIFY   Alkaline Phosphatase 108 38 - 126 U/L   Total Bilirubin 0.8 0.3 - 1.2 mg/dL   GFR, Estimated 10 (L) >60 mL/min    Comment: (NOTE) Calculated using the CKD-EPI Creatinine Equation (2021)    Anion gap 14 5 - 15    Comment: Performed at [facility] [address] [place], [place] [address]  Lactic acid, plasma     Status: None   Collection Time: 04/12/2020  9:41 PM  Result Value Ref Range   Lactic Acid, Venous 1.3 0.5 - 1.9 mmol/L    Comment: Performed at [facility] [address] [place], [place] [address]  CBC with Differential     Status: Abnormal   Collection Time: 05/06/2020  9:41 PM  Result Value Ref Range   WBC 12.6 (H) 4.0 - 10.5 K/uL   RBC 3.20 (L) 3.87 - 5.11 MIL/uL   Hemoglobin 10.0 (L) 12.0 - 15.0 g/dL   HCT 30.7 (L) 36.0 - 46.0 %   MCV 95.9 80.0 - 100.0 fL   MCH 31.3 26.0 - 34.0 pg   MCHC 32.6 30.0 - 36.0 g/dL   RDW 16.9 (H) 11.5 - 15.5 %   Platelets 200 150 - 400 K/uL   nRBC 0.0 0.0 - 0.2 %   Neutrophils Relative % 83 %   Neutro Abs 10.7 (H) 1.7 - 7.7 K/uL    Lymphocytes Relative 9 %   Lymphs Abs 1.1 0.7 - 4.0 K/uL   Monocytes Relative 6 %   Monocytes Absolute 0.7 0.1 - 1.0 K/uL   Eosinophils Relative 1 %   Eosinophils Absolute 0.1 0.0 - 0.5 K/uL   Basophils Relative 0 %   Basophils Absolute 0.0 0.0 - 0.1 K/uL   Immature Granulocytes 1 %   Abs Immature Granulocytes 0.08 (H) 0.00 - 0.07 K/uL    Comment: Performed at [facility] [address] [place], [place] [address]  Protime-INR     Status: Abnormal   Collection Time: 04/25/2020  9:41 PM  Result Value Ref Range   Prothrombin Time 18.8 (H) 11.4 - 15.2 seconds   INR 1.6 (H) 0.8 - 1.2    Comment: (NOTE) INR goal varies based on device and disease states. Performed at [facility], [place] [address] [place], [place] [address]   Culture, blood (routine x 2)     Status: None (Preliminary result)   Collection Time: 04/26/2020  9:41 PM   Specimen: BLOOD LEFT HAND  Result Value Ref Range   Specimen Description BLOOD LEFT HAND    Special Requests      BOTTLES  DRAWN AEROBIC AND ANAEROBIC Blood Culture results may not be optimal due to an inadequate volume of blood received in culture bottles   Culture      NO GROWTH < 12 HOURS Performed at [facility] [address] [place], [place] [address]    Report Status PENDING   Resp Panel by RT-PCR (Flu A&B, Covid) Nasopharyngeal Swab     Status: None   Collection Time: 04/19/2020 10:51 PM   Specimen: Nasopharyngeal Swab; Nasopharyngeal(NP) swabs in vial transport medium  Result Value Ref Range   SARS Coronavirus 2 by RT PCR NEGATIVE NEGATIVE    Comment: (NOTE) SARS-CoV-2 target nucleic acids are NOT DETECTED.  The SARS-CoV-2 RNA is generally detectable in upper respiratory specimens during the acute phase of infection. The lowest concentration of SARS-CoV-2 viral copies this assay can detect is 138 copies/mL. A negative result does not preclude SARS-Cov-2 infection and should not be used as the sole basis for treatment  or other patient management decisions. A negative result may occur with  improper specimen collection/handling, submission of specimen other than nasopharyngeal swab, presence of viral mutation(s) within the areas targeted by this assay, and inadequate number of viral copies(<138 copies/mL). A negative result must be combined with clinical observations, patient history, and epidemiological information. The expected result is Negative.  Fact Sheet for Patients:  EntrepreneurPulse.com.au  Fact Sheet for Healthcare Providers:  IncredibleEmployment.be  This test is no t yet approved or cleared by the [place] FDA and  has been authorized for detection and/or diagnosis of SARS-CoV-2 by FDA under an Emergency Use Authorization (EUA). This EUA will remain  in effect (meaning this test can be used) for the duration of the COVID-19 declaration under Section 564(b)(1) of the Act, 21 U.S.C.section 360bbb-3(b)(1), unless the authorization is terminated  or revoked sooner.       Influenza A by PCR NEGATIVE NEGATIVE   Influenza B by PCR NEGATIVE NEGATIVE    Comment: (NOTE) The Xpert Xpress SARS-CoV-2/FLU/RSV plus assay is intended as an aid in the diagnosis of influenza from Nasopharyngeal swab specimens and should not be used as a sole basis for treatment. Nasal washings and aspirates are unacceptable for Xpert Xpress SARS-CoV-2/FLU/RSV testing.  Fact Sheet for Patients: EntrepreneurPulse.com.au  Fact Sheet for Healthcare Providers: IncredibleEmployment.be  This test is not yet approved or cleared by the [place] FDA and has been authorized for detection and/or diagnosis of SARS-CoV-2 by FDA under an Emergency Use Authorization (EUA). This EUA will remain in effect (meaning this test can be used) for the duration of the COVID-19 declaration under Section 564(b)(1) of the Act, 21 U.S.C. section  360bbb-3(b)(1), unless the authorization is terminated or revoked.  Performed at [facility], [place] [address], [place], [place] [address]   Glucose, capillary     Status: None   Collection Time: 04/23/20  1:40 AM  Result Value Ref Range   Glucose-Capillary 78 70 - 99 mg/dL    Comment: Glucose reference range applies only to samples taken after fasting for at least 8 hours.  Glucose, capillary     Status: None   Collection Time: 04/23/20  4:10 AM  Result Value Ref Range   Glucose-Capillary 84 70 - 99 mg/dL    Comment: Glucose reference range applies only to samples taken after fasting for at least 8 hours.  Lactic acid, plasma     Status: None   Collection Time: 04/23/20  4:15 AM  Result Value Ref Range  Lactic Acid, Venous 1.1 0.5 - 1.9 mmol/L    Comment: Performed at [facility], [place] [address], [place], [place] [address]  Culture, blood (routine x 2)     Status: None (Preliminary result)   Collection Time: 04/23/20  4:15 AM   Specimen: BLOOD LEFT HAND  Result Value Ref Range   Specimen Description BLOOD LEFT HAND    Special Requests      BOTTLES DRAWN AEROBIC AND ANAEROBIC Blood Culture adequate volume   Culture      NO GROWTH <12 HOURS Performed at [facility], [place] [address], [place], [place] [address]    Report Status PENDING   CBC     Status: Abnormal   Collection Time: 04/23/20  4:15 AM  Result Value Ref Range   WBC 13.6 (H) 4.0 - 10.5 K/uL   RBC 2.93 (L) 3.87 - 5.11 MIL/uL   Hemoglobin 9.0 (L) 12.0 - 15.0 g/dL   HCT 28.3 (L) 36.0 - 46.0 %   MCV 96.6 80.0 - 100.0 fL   MCH 30.7 26.0 - 34.0 pg   MCHC 31.8 30.0 - 36.0 g/dL   RDW 16.9 (H) 11.5 - 15.5 %   Platelets 206 150 - 400 K/uL   nRBC 0.0 0.0 - 0.2 %    Comment: Performed at [facility] [address] [place], [place] [address]  Basic metabolic panel     Status: Abnormal   Collection Time: 04/23/20  4:15 AM  Result Value Ref Range   Sodium 135 135 - 145 mmol/L   Potassium 3.1  (L) 3.5 - 5.1 mmol/L   Chloride 94 (L) 98 - 111 mmol/L   CO2 25 22 - 32 mmol/L   Glucose, Bld 89 70 - 99 mg/dL    Comment: Glucose reference range applies only to samples taken after fasting for at least 8 hours.   BUN 40 (H) 8 - 23 mg/dL   Creatinine, Ser 4.43 (H) 0.44 - 1.00 mg/dL   Calcium 7.8 (L) 8.9 - 10.3 mg/dL   GFR, Estimated 10 (L) >60 mL/min    Comment: (NOTE) Calculated using the CKD-EPI Creatinine Equation (2021)    Anion gap 16 (H) 5 - 15    Comment: Performed at [facility] [address] [place], [place] [address]  Prealbumin     Status: Abnormal   Collection Time: 04/23/20  4:15 AM  Result Value Ref Range   Prealbumin <5 (L) 18 - 38 mg/dL    Comment: Performed at [facility] [address], [place], [place] [address]  Hemoglobin A1c     Status: None   Collection Time: 04/23/20  4:15 AM  Result Value Ref Range   Hgb A1c MFr Bld 5.0 4.8 - 5.6 %    Comment: (NOTE) Pre diabetes:          5.7%-6.4%  Diabetes:              >6.4%  Glycemic control for   <7.0% adults with diabetes    Mean Plasma Glucose 96.8 mg/dL    Comment: Performed at [facility] [address] [place], [place] [address]  Glucose, capillary     Status: None   Collection Time: 04/23/20  8:07 AM  Result Value Ref Range   Glucose-Capillary 87 70 - 99 mg/dL    Comment: Glucose reference range applies only to samples taken after fasting for at least 8 hours.  Glucose, capillary  Status: Abnormal   Collection Time: 04/23/20 11:53 AM  Result Value Ref Range   Glucose-Capillary 174 (H) 70 - 99 mg/dL    Comment: Glucose reference range applies only to samples taken after fasting for at least 8 hours.   Comment 1 Notify RN    Comment 2 Document in Chart    *Note: Due to a large number of results and/or encounters for the requested time period, some results have not been displayed. A complete set of results can be found in Results Review.    ROS: See HPI  Physical  Exam: Vitals:   04/23/20 0805 04/23/20 0835  BP: (!) 104/51 (!) 107/55  Pulse: 82 84  Resp: 16 20  Temp:    SpO2: 95% 97%     General: Alert elderly female thin chronically ill-appearing NAD HEENT: [place], PERRLA, EOMI, nonicteric, Neck: No JVD Heart: RRR, no MRG Lungs: CTA, nonlabored breathing Abdomen: Bowel sounds positive, NTND Extremities: Right groin bandage stable clean dry no pedal edema Skin: Warm, dry right groin dressing is noted, no overt rash Neuro: Alert orient x3, no acute overt focal deficits appreciated Dialysis Access: Right upper arm AV fistula positive bruit  Dialysis Orders: Center: [facility] on TTS. EDW 65.5 kg HD [place] 2K 2 Ca  3 hours 45 minutes ,no heparin. Access RUA AVF      Mircera 30 MCG every 2 weeks last on 01/13   units IV/HD  Venofer 100 mg loading needs 9 more doses   Assessment/Plan 1. Infected right groin surgical wound ( DEC 2021 femoropopliteal bypass surgery) 2. ESRD= HD schedule TTS, K3.1, CR 4.43 volume okay, plan for dialysis tomorrow no heparin 3. Hypertension/volume  -history of variable BP 4. Anemia  - HGB = 9.0 continue Aranesp next dose on Thursday, hold iron load with infection 5. Metabolic bone disease -  corec ca 9.3  Phos 6.6  , continue Renvela binder, Sensipar 30 daily 6. Nutrition -renal diet with fluid restrictions renal vitamin 7. History of CAD 8. History of systolic/diastolic heart failure  Ernest Haber, PA-C [facility] [phone] 04/23/2020, 12:36 PM

## 2020-04-23 NOTE — Progress Notes (Signed)
  Progress Note    04/23/2020 1:40 PM 1 Day Post-Op  Subjective:  No overnight issues  Vitals:   04/23/20 0835 04/23/20 1235  BP: (!) 107/55   Pulse: 84 86  Resp: 20 15  Temp:    SpO2: 97% 97%    Physical Exam: aaxo3 R AT signal   CBC    Component Value Date/Time   WBC 13.6 (H) 04/23/2020 0415   RBC 2.93 (L) 04/23/2020 0415   HGB 9.0 (L) 04/23/2020 0415   HGB 11.7 05/18/2017 1218   HGB 12.3 02/02/2017 1136   HCT 28.3 (L) 04/23/2020 0415   HCT 34.7 05/18/2017 1218   HCT 37.4 02/02/2017 1136   PLT 206 04/23/2020 0415   PLT 227 05/18/2017 1218   MCV 96.6 04/23/2020 0415   MCV 87 05/18/2017 1218   MCV 90.6 02/02/2017 1136   MCH 30.7 04/23/2020 0415   MCHC 31.8 04/23/2020 0415   RDW 16.9 (H) 04/23/2020 0415   RDW 16.0 (H) 05/18/2017 1218   RDW 15.7 (H) 02/02/2017 1136   LYMPHSABS 1.1 04/23/2020 2141   LYMPHSABS 1.8 02/02/2017 1136   MONOABS 0.7 05/02/2020 2141   MONOABS 0.8 02/02/2017 1136   EOSABS 0.1 04/25/2020 2141   EOSABS 0.3 02/02/2017 1136   BASOSABS 0.0 04/19/2020 2141   BASOSABS 0.1 02/02/2017 1136    BMET    Component Value Date/Time   NA 135 04/23/2020 0415   NA 143 05/18/2017 1218   NA 143 02/02/2017 1137   K 3.1 (L) 04/23/2020 0415   K 3.8 02/02/2017 1137   CL 94 (L) 04/23/2020 0415   CO2 25 04/23/2020 0415   CO2 24 02/02/2017 1137   GLUCOSE 89 04/23/2020 0415   GLUCOSE 74 02/02/2017 1137   BUN 40 (H) 04/23/2020 0415   BUN 76 (HH) 05/18/2017 1218   BUN 63.4 (H) 02/02/2017 1137   CREATININE 4.43 (H) 04/23/2020 0415   CREATININE 5.12 (H) 02/13/2020 1518   CREATININE 3.0 (HH) 02/02/2017 1137   CALCIUM 7.8 (L) 04/23/2020 0415   CALCIUM 9.9 02/02/2017 1137   GFRNONAA 10 (L) 04/23/2020 0415   GFRNONAA 8 (L) 02/13/2020 1518   GFRAA 9 (L) 02/13/2020 1518    INR    Component Value Date/Time   INR 1.6 (H) 05/07/2020 2141     Intake/Output Summary (Last 24 hours) at 04/23/2020 1340 Last data filed at 04/23/2020 0735 Gross per 24 hour   Intake 2300 ml  Output 100 ml  Net 2200 ml     Assessment:  75 y.o. female is s/p R groin debridement  Plan: OR tomorrow repeat debridement Will need muscle flap vs graft excision   Brandon C. Donzetta Matters, MD [facility] Office: [phone] Pager: [phone]  04/23/2020 1:40 PM

## 2020-04-23 NOTE — Progress Notes (Signed)
Assisted pt to reposition for comfort and reassessed pt's right groin and thigh wound. Dressing was saturated with serosanguinous fluid that had also soaked onto her gown. Assessment of site after removal of dressing revealed redness and induration of wound bed and area below incision on pt's thigh that was also red and firm to the touch. RN Moishe Spice contacted Dr. Donzetta Matters while this nurse continued to re-dress pt's wound and change her gown. Dr. Donzetta Matters verified that the wound appeared the same to him previously but assured [name] that he would come to assess the patients wound this morning.She is in no distress and her vitals have remained stable. 04/17/2020 @ 0618 Pamela Bender, RN

## 2020-04-24 ENCOUNTER — Encounter (HOSPITAL_COMMUNITY): Admission: EM | Disposition: E | Payer: Self-pay | Source: Skilled Nursing Facility | Attending: Vascular Surgery

## 2020-04-24 ENCOUNTER — Inpatient Hospital Stay (HOSPITAL_COMMUNITY): Payer: Medicare Other | Admitting: Anesthesiology

## 2020-04-24 ENCOUNTER — Encounter (HOSPITAL_COMMUNITY): Payer: Self-pay | Admitting: Vascular Surgery

## 2020-04-24 DIAGNOSIS — I9789 Other postprocedural complications and disorders of the circulatory system, not elsewhere classified: Secondary | ICD-10-CM | POA: Diagnosis not present

## 2020-04-24 HISTORY — PX: GROIN DEBRIDEMENT: SHX5159

## 2020-04-24 LAB — CBC
HCT: 25.8 % — ABNORMAL LOW (ref 36.0–46.0)
Hemoglobin: 8.4 g/dL — ABNORMAL LOW (ref 12.0–15.0)
MCH: 31.2 pg (ref 26.0–34.0)
MCHC: 32.6 g/dL (ref 30.0–36.0)
MCV: 95.9 fL (ref 80.0–100.0)
Platelets: 218 10*3/uL (ref 150–400)
RBC: 2.69 MIL/uL — ABNORMAL LOW (ref 3.87–5.11)
RDW: 17.2 % — ABNORMAL HIGH (ref 11.5–15.5)
WBC: 16.8 10*3/uL — ABNORMAL HIGH (ref 4.0–10.5)
nRBC: 0 % (ref 0.0–0.2)

## 2020-04-24 LAB — RENAL FUNCTION PANEL
Albumin: 2.2 g/dL — ABNORMAL LOW (ref 3.5–5.0)
Anion gap: 15 (ref 5–15)
BUN: 59 mg/dL — ABNORMAL HIGH (ref 8–23)
CO2: 25 mmol/L (ref 22–32)
Calcium: 8 mg/dL — ABNORMAL LOW (ref 8.9–10.3)
Chloride: 94 mmol/L — ABNORMAL LOW (ref 98–111)
Creatinine, Ser: 5.29 mg/dL — ABNORMAL HIGH (ref 0.44–1.00)
GFR, Estimated: 8 mL/min — ABNORMAL LOW (ref >60)
Glucose, Bld: 104 mg/dL — ABNORMAL HIGH (ref 70–99)
Phosphorus: 5.2 mg/dL — ABNORMAL HIGH (ref 2.5–4.6)
Potassium: 4.3 mmol/L (ref 3.5–5.1)
Sodium: 134 mmol/L — ABNORMAL LOW (ref 135–145)

## 2020-04-24 LAB — GLUCOSE, CAPILLARY
Glucose-Capillary: 169 mg/dL — ABNORMAL HIGH (ref 70–99)
Glucose-Capillary: 169 mg/dL — ABNORMAL HIGH (ref 70–99)
Glucose-Capillary: 173 mg/dL — ABNORMAL HIGH (ref 70–99)
Glucose-Capillary: 158 mg/dL — ABNORMAL HIGH (ref 70–99)
Glucose-Capillary: 107 mg/dL — ABNORMAL HIGH (ref 70–99)

## 2020-04-24 LAB — POCT I-STAT, CHEM 8
BUN: 51 mg/dL — ABNORMAL HIGH (ref 8–23)
Calcium, Ion: 1.12 mmol/L — ABNORMAL LOW (ref 1.15–1.40)
Chloride: 94 mmol/L — ABNORMAL LOW (ref 98–111)
Creatinine, Ser: 5 mg/dL — ABNORMAL HIGH (ref 0.44–1.00)
Glucose, Bld: 158 mg/dL — ABNORMAL HIGH (ref 70–99)
HCT: 32 % — ABNORMAL LOW (ref 36.0–46.0)
Hemoglobin: 10.9 g/dL — ABNORMAL LOW (ref 12.0–15.0)
Potassium: 4.3 mmol/L (ref 3.5–5.1)
Sodium: 134 mmol/L — ABNORMAL LOW (ref 135–145)
TCO2: 29 mmol/L (ref 22–32)

## 2020-04-24 SURGERY — DEBRIDEMENT, INGUINAL REGION
Anesthesia: General | Site: Groin | Laterality: Right

## 2020-04-24 MED ORDER — FENTANYL CITRATE (PF) 250 MCG/5ML IJ SOLN
INTRAMUSCULAR | Status: AC
  Filled 2020-04-24: qty 5

## 2020-04-24 MED ORDER — PROPOFOL 10 MG/ML IV BOLUS
INTRAVENOUS | Status: AC
  Filled 2020-04-24: qty 40

## 2020-04-24 MED ORDER — VASOPRESSIN 20 UNIT/ML IV SOLN
INTRAVENOUS | Status: DC | PRN
  Administered 2020-04-24: 2 [IU] via INTRAVENOUS

## 2020-04-24 MED ORDER — ONDANSETRON HCL 4 MG/2ML IJ SOLN
INTRAMUSCULAR | Status: DC | PRN
  Administered 2020-04-24: 4 mg via INTRAVENOUS

## 2020-04-24 MED ORDER — SODIUM CHLORIDE 0.9 % IR SOLN
Status: DC | PRN
  Administered 2020-04-24: 3000 mL

## 2020-04-24 MED ORDER — VANCOMYCIN HCL IN DEXTROSE 750-5 MG/150ML-% IV SOLN
INTRAVENOUS | Status: AC
  Administered 2020-04-24: 750 mg via INTRAVENOUS
  Filled 2020-04-24: qty 150

## 2020-04-24 MED ORDER — LIDOCAINE 2% (20 MG/ML) 5 ML SYRINGE
INTRAMUSCULAR | Status: AC
  Filled 2020-04-24: qty 5

## 2020-04-24 MED ORDER — DARBEPOETIN ALFA 60 MCG/0.3ML IJ SOSY
60.0000 ug | PREFILLED_SYRINGE | INTRAMUSCULAR | Status: DC
  Administered 2020-05-03: 60 ug via INTRAVENOUS
  Filled 2020-04-24: qty 0.3

## 2020-04-24 MED ORDER — PHENYLEPHRINE 40 MCG/ML (10ML) SYRINGE FOR IV PUSH (FOR BLOOD PRESSURE SUPPORT)
PREFILLED_SYRINGE | INTRAVENOUS | Status: DC | PRN
  Administered 2020-04-24: 160 ug via INTRAVENOUS
  Administered 2020-04-24 (×2): 120 ug via INTRAVENOUS

## 2020-04-24 MED ORDER — SODIUM CHLORIDE 0.9 % IV SOLN: INTRAVENOUS | Status: DC | PRN

## 2020-04-24 MED ORDER — VANCOMYCIN HCL 1250 MG/250ML IV SOLN
1250.0000 mg | Freq: Once | INTRAVENOUS | Status: AC
  Administered 2020-04-24: 1250 mg via INTRAVENOUS
  Filled 2020-04-24: qty 250

## 2020-04-24 MED ORDER — PROPOFOL 10 MG/ML IV BOLUS
INTRAVENOUS | Status: DC | PRN
  Administered 2020-04-24: 150 mg via INTRAVENOUS

## 2020-04-24 MED ORDER — SODIUM CHLORIDE 0.9 % IV SOLN
INTRAVENOUS | Status: AC
  Filled 2020-04-24: qty 1.2

## 2020-04-24 MED ORDER — SUCCINYLCHOLINE CHLORIDE 200 MG/10ML IV SOSY
PREFILLED_SYRINGE | INTRAVENOUS | Status: AC
  Filled 2020-04-24: qty 10

## 2020-04-24 MED ORDER — FENTANYL CITRATE (PF) 100 MCG/2ML IJ SOLN: 25.0000 ug | INTRAMUSCULAR | Status: DC | PRN

## 2020-04-24 MED ORDER — ROCURONIUM BROMIDE 10 MG/ML (PF) SYRINGE
PREFILLED_SYRINGE | INTRAVENOUS | Status: AC
  Filled 2020-04-24: qty 10

## 2020-04-24 MED ORDER — ALBUMIN HUMAN 5 % IV SOLN: INTRAVENOUS | Status: DC | PRN

## 2020-04-24 MED ORDER — LIDOCAINE 2% (20 MG/ML) 5 ML SYRINGE
INTRAMUSCULAR | Status: DC | PRN
  Administered 2020-04-24: 60 mg via INTRAVENOUS

## 2020-04-24 MED ORDER — 0.9 % SODIUM CHLORIDE (POUR BTL) OPTIME
TOPICAL | Status: DC | PRN
  Administered 2020-04-24: 1000 mL

## 2020-04-24 MED ORDER — VASOPRESSIN 20 UNIT/ML IV SOLN
INTRAVENOUS | Status: AC
  Filled 2020-04-24: qty 1

## 2020-04-24 MED ORDER — EPHEDRINE SULFATE-NACL 50-0.9 MG/10ML-% IV SOSY
PREFILLED_SYRINGE | INTRAVENOUS | Status: DC | PRN
  Administered 2020-04-24: 15 mg via INTRAVENOUS
  Administered 2020-04-24: 10 mg via INTRAVENOUS

## 2020-04-24 MED ORDER — FENTANYL CITRATE (PF) 100 MCG/2ML IJ SOLN
INTRAMUSCULAR | Status: DC | PRN
  Administered 2020-04-24 (×2): 25 ug via INTRAVENOUS

## 2020-04-24 MED ORDER — MIDAZOLAM HCL 2 MG/2ML IJ SOLN
INTRAMUSCULAR | Status: AC
  Filled 2020-04-24: qty 2

## 2020-04-24 SURGICAL SUPPLY — 40 items
ADH SKN CLS APL DERMABOND .7 (GAUZE/BANDAGES/DRESSINGS)
CANISTER SUCT 3000ML PPV (MISCELLANEOUS) ×2 IMPLANT
CANISTER WOUND CARE 500ML ATS (WOUND CARE) ×1 IMPLANT
CLIP VESOCCLUDE MED 24/CT (CLIP) ×1 IMPLANT
CLIP VESOCCLUDE SM WIDE 24/CT (CLIP) ×1 IMPLANT
COVER WAND RF STERILE (DRAPES) ×1 IMPLANT
DERMABOND ADVANCED (GAUZE/BANDAGES/DRESSINGS)
DERMABOND ADVANCED .7 DNX12 (GAUZE/BANDAGES/DRESSINGS) ×1 IMPLANT
DRAIN CHANNEL 15F RND FF W/TCR (WOUND CARE) IMPLANT
DRSG VAC ATS MED SENSATRAC (GAUZE/BANDAGES/DRESSINGS) ×1 IMPLANT
ELECT REM PT RETURN 9FT ADLT (ELECTROSURGICAL) ×2
ELECTRODE REM PT RTRN 9FT ADLT (ELECTROSURGICAL) ×1 IMPLANT
EVACUATOR SILICONE 100CC (DRAIN) IMPLANT
GLOVE BIO SURGEON STRL SZ7.5 (GLOVE) ×1 IMPLANT
GLOVE SURG UNDER POLY LF SZ7 (GLOVE) ×1 IMPLANT
GOWN STRL REUS W/ TWL LRG LVL3 (GOWN DISPOSABLE) ×2 IMPLANT
GOWN STRL REUS W/ TWL XL LVL3 (GOWN DISPOSABLE) ×1 IMPLANT
GOWN STRL REUS W/TWL LRG LVL3 (GOWN DISPOSABLE) ×4
GOWN STRL REUS W/TWL XL LVL3 (GOWN DISPOSABLE) ×2
HANDPIECE INTERPULSE COAX TIP (DISPOSABLE) ×2
KIT BASIN OR (CUSTOM PROCEDURE TRAY) ×2 IMPLANT
KIT TURNOVER KIT B (KITS) ×2 IMPLANT
NS IRRIG 1000ML POUR BTL (IV SOLUTION) ×3 IMPLANT
PACK PERIPHERAL VASCULAR (CUSTOM PROCEDURE TRAY) ×2 IMPLANT
PAD ARMBOARD 7.5X6 YLW CONV (MISCELLANEOUS) ×4 IMPLANT
SET HNDPC FAN SPRY TIP SCT (DISPOSABLE) IMPLANT
SUT ETHILON 3 0 PS 1 (SUTURE) IMPLANT
SUT MNCRL AB 4-0 PS2 18 (SUTURE) ×1 IMPLANT
SUT PROLENE 5 0 C 1 24 (SUTURE) ×1 IMPLANT
SUT PROLENE 6 0 BV (SUTURE) ×1 IMPLANT
SUT VIC AB 2-0 CT1 27 (SUTURE)
SUT VIC AB 2-0 CT1 TAPERPNT 27 (SUTURE) ×1 IMPLANT
SUT VIC AB 2-0 SH 18 (SUTURE) ×1 IMPLANT
SUT VIC AB 3-0 SH 27 (SUTURE)
SUT VIC AB 3-0 SH 27X BRD (SUTURE) ×1 IMPLANT
SYR BULB IRRIG 60ML STRL (SYRINGE) ×1 IMPLANT
TOWEL GREEN STERILE (TOWEL DISPOSABLE) ×4 IMPLANT
TOWEL GREEN STERILE FF (TOWEL DISPOSABLE) ×1 IMPLANT
UNDERPAD 30X36 HEAVY ABSORB (UNDERPADS AND DIAPERS) ×2 IMPLANT
WATER STERILE IRR 1000ML POUR (IV SOLUTION) ×2 IMPLANT

## 2020-04-24 NOTE — Progress Notes (Signed)
  Progress Note    04/18/2020 7:24 AM Day of Surgery  Subjective:  No overnight issues  Vitals:   04/23/20 2309 04/13/2020 0400  BP: (!) 108/47 (!) 114/52  Pulse: 75 82  Resp: 12 14  Temp: 98 F (36.7 C) 98.6 F (37 C)  SpO2: 96% 97%    Physical Exam: aaox3 Non labored respirations Right groin dressing cdi  CBC    Component Value Date/Time   WBC 13.6 (H) 04/23/2020 0415   RBC 2.93 (L) 04/23/2020 0415   HGB 9.0 (L) 04/23/2020 0415   HGB 11.7 05/18/2017 1218   HGB 12.3 02/02/2017 1136   HCT 28.3 (L) 04/23/2020 0415   HCT 34.7 05/18/2017 1218   HCT 37.4 02/02/2017 1136   PLT 206 04/23/2020 0415   PLT 227 05/18/2017 1218   MCV 96.6 04/23/2020 0415   MCV 87 05/18/2017 1218   MCV 90.6 02/02/2017 1136   MCH 30.7 04/23/2020 0415   MCHC 31.8 04/23/2020 0415   RDW 16.9 (H) 04/23/2020 0415   RDW 16.0 (H) 05/18/2017 1218   RDW 15.7 (H) 02/02/2017 1136   LYMPHSABS 1.1 05/01/2020 2141   LYMPHSABS 1.8 02/02/2017 1136   MONOABS 0.7 04/29/2020 2141   MONOABS 0.8 02/02/2017 1136   EOSABS 0.1 04/16/2020 2141   EOSABS 0.3 02/02/2017 1136   BASOSABS 0.0 04/25/2020 2141   BASOSABS 0.1 02/02/2017 1136    BMET    Component Value Date/Time   NA 135 04/23/2020 0415   NA 143 05/18/2017 1218   NA 143 02/02/2017 1137   K 3.1 (L) 04/23/2020 0415   K 3.8 02/02/2017 1137   CL 94 (L) 04/23/2020 0415   CO2 25 04/23/2020 0415   CO2 24 02/02/2017 1137   GLUCOSE 89 04/23/2020 0415   GLUCOSE 74 02/02/2017 1137   BUN 40 (H) 04/23/2020 0415   BUN 76 (HH) 05/18/2017 1218   BUN 63.4 (H) 02/02/2017 1137   CREATININE 4.43 (H) 04/23/2020 0415   CREATININE 5.12 (H) 02/13/2020 1518   CREATININE 3.0 (HH) 02/02/2017 1137   CALCIUM 7.8 (L) 04/23/2020 0415   CALCIUM 9.9 02/02/2017 1137   GFRNONAA 10 (L) 04/23/2020 0415   GFRNONAA 8 (L) 02/13/2020 1518   GFRAA 9 (L) 02/13/2020 1518    INR    Component Value Date/Time   INR 1.6 (H) 04/18/2020 2141     Intake/Output Summary (Last 24  hours) at 04/09/2020 0724 Last data filed at 04/23/2020 2200 Gross per 24 hour  Intake 1720 ml  Output -  Net 1720 ml     Assessment:  75 y.o. female is s/p right groin debridement Plan: OR today for washout  Lenville Hibberd C. Donzetta Matters, MD [facility] [place] Office: [phone] Pager: [phone]  05/03/2020 7:24 AM

## 2020-04-24 NOTE — Progress Notes (Signed)
[facility] Progress Note   Subjective:   Patient seen and examined at bedside post procedure.  Reports pain at surgical site.  Denies CP, SOB, orthopnea, and edema.   Objective Vitals:   05/03/2020 0909 04/12/2020 0924 05/06/2020 0946 05/06/2020 1146  BP: (!) 108/39 (!) 104/39 (!) 95/43 (!) 105/43  Pulse: 84 81 87 85  Resp: 16 11 16 18   Temp: (!) 97.3 F (36.3 C)  97.6 F (36.4 C) (!) 97.5 F (36.4 C)  TempSrc:   Oral Oral  SpO2: 96% 96% 95% 99%  Weight:       Physical Exam General:chronically ill appearing female in NAD Heart:RRR, no MRG Lungs:CTAB, nml WOB on 2L via [place] Abdomen:soft, NTND Extremities:trace LE edema on R, no edema on L, wound vac on R groin, b/l feet dressed Dialysis Access: RU AVF +b   Filed Weights   04/27/2020 0400  Weight: 65.4 kg    Intake/Output Summary (Last 24 hours) at 04/09/2020 1309 Last data filed at 04/30/2020 0854 Gross per 24 hour  Intake 120 ml  Output 10 ml  Net 110 ml    Additional Objective Labs: Basic Metabolic Panel: Recent Labs  Lab 05/02/2020 2141 04/23/20 0415 05/04/2020 0744  NA 136 135 134*  K 3.2* 3.1* 4.3  CL 96* 94* 94*  CO2 26 25  --   GLUCOSE 76 89 158*  BUN 40* 40* 51*  CREATININE 4.41* 4.43* 5.00*  CALCIUM 7.8* 7.8*  --    Liver Function Tests: Recent Labs  Lab 04/25/2020 2141  AST 13*  ALT <5  ALKPHOS 108  BILITOT 0.8  PROT 4.9*  ALBUMIN 2.2*   CBC: Recent Labs  Lab 05/03/2020 2141 04/23/20 0415 04/23/2020 0744  WBC 12.6* 13.6*  --   NEUTROABS 10.7*  --   --   HGB 10.0* 9.0* 10.9*  HCT 30.7* 28.3* 32.0*  MCV 95.9 96.6  --   PLT 200 206  --    Blood Culture    Component Value Date/Time   SDES BLOOD LEFT HAND 04/23/2020 0415   SPECREQUEST  04/23/2020 0415    BOTTLES DRAWN AEROBIC AND ANAEROBIC Blood Culture adequate volume   CULT  04/23/2020 0415    NO GROWTH 1 DAY Performed at [facility], [place] [address] [place], [place] [address]    REPTSTATUS PENDING 04/23/2020 0415    CBG: Recent Labs  Lab 04/23/20 1718 04/23/20 2113 04/21/2020 0609 05/06/2020 0907 04/28/2020 1147  GLUCAP 180* 159* 169* 169* 173*   Studies/Results: DG Chest 1 View  Result Date: 04/28/2020 CLINICAL DATA:  Weakness EXAM: CHEST  1 VIEW COMPARISON:  03/23/2019 FINDINGS: Moderate cardiomegaly. Central pulmonary vascular congestion without overt edema. No focal airspace consolidation. Normal pleural spaces. Remote median sternotomy. IMPRESSION: Cardiomegaly and central pulmonary vascular congestion without overt pulmonary edema. Electronically Signed   By: Ulyses Jarred M.D.   On: 04/11/2020 22:05    Medications: . piperacillin-tazobactam (ZOSYN)  IV 2.25 g (04/13/2020 1011)  . vancomycin     . allopurinol  100 mg Oral BID  . atorvastatin  10 mg Oral Daily  . cinacalcet  30 mg Oral Q supper  . collagenase   Topical Daily  . [START ON 04/26/2020] darbepoetin (ARANESP) injection - DIALYSIS  60 mcg Intravenous Q Thu-HD  . docusate sodium  100 mg Oral BID  . feeding supplement (NEPRO CARB STEADY)   Oral BID  . heparin  5,000 Units Subcutaneous Q8H  . hydrALAZINE  75 mg Oral  TID  . insulin aspart  0-5 Units Subcutaneous QHS  . insulin aspart  0-9 Units Subcutaneous TID WC  . insulin aspart  3 Units Subcutaneous TID WC  . isosorbide dinitrate  10 mg Oral TID  . multivitamin  1 tablet Oral QHS  . pantoprazole  40 mg Oral Daily  . sevelamer carbonate  1,600 mg Oral TID WC  . traZODone  25-50 mg Oral QHS    Dialysis Orders: NW on TTS. EDW 65.5 kg HD Bath 2K 2 Ca  3 hours 45 minutes ,no heparin. Access RUA AVF      Mircera 30 MCG every 2 weeks last on 01/13 units IV/HD  Venofer 100 mg loading needs 9 more doses   Assessment/Plan 1. Infected right groin surgical wound ( DEC 2021 femoropopliteal bypass surgery) - initial debridement yesterday with additional washout today with wound vac placement by Dr. Donzetta Matters 2. ESRD - on HD schedule TTS. Dialysis today per regular schedule. K 4.3. No  heparin 3. Hypertension/volume - BP soft today. Does not appear grossly volume overloaded.  UF as tolerated.  4. Anemia of CKD - Hgb 10.9. continue Aranesp next dose on Thursday, hold iron load with infection 5. Metabolic bone disease -  CCa in goal. Phos elevated, continue Renvela binder, Sensipar 30 daily 6. Nutrition - renal diet with fluid restrictions, vitamins 7. History of CAD 8. History of systolic/diastolic heart failure   Jen Mow, PA-C [facility] 04/20/2020,1:09 PM  LOS: 1 day

## 2020-04-24 NOTE — Transfer of Care (Signed)
Immediate Anesthesia Transfer of Care Note  Patient: [name]  Procedure(s) Performed: Virl Son DEBRIDEMENT RIGHT (Right Groin)  Patient Location: PACU  Anesthesia Type:General  Level of Consciousness: drowsy and patient cooperative  Airway & Oxygen Therapy: Patient Spontanous Breathing and Patient connected to nasal cannula oxygen  Post-op Assessment: Report given to RN and Post -op Vital signs reviewed and stable  Post vital signs: Reviewed and stable  Last Vitals:  Vitals Value Taken Time  BP 108/39 04/13/2020 0909  Temp    Pulse 52 05/02/2020 0911  Resp 12 04/22/2020 0911  SpO2 95 % 04/07/2020 0911  Vitals shown include unvalidated device data.  Last Pain:  Vitals:   05/01/2020 0400  TempSrc: Oral  PainSc:       Patients Stated Pain Goal: 0 (40/10/27 2536)  Complications: No complications documented.

## 2020-04-24 NOTE — Op Note (Signed)
    Patient name: [name] MRN: [id number] DOB: 1946-01-27 Sex: female  04/12/2020 Pre-operative Diagnosis: right groin post surgical non healing wound Post-operative diagnosis:  Same Surgeon:  Erlene Quan C. Donzetta Matters, MD Procedure Performed: 1.  Washout of right groin wound with pulse lavage total 3 Liters 2.  Right sartorius muscle flap coverage of dacron and ptfe grafts 3.  Placement of wound vacuum right groin wound total 9 x 5cm  Indications: 75 year old female with a history of a right common femoral endarterectomy and right femoral to below-knee popliteal artery bypass with graft.  She recently had infection of the right groin wound and had this washed out.  She is now indicated for return to the operating room for washout with possible tissue coverage.  Findings: The wound itself was very clean without foul smell there is no evidence of purulence.  The sartorius muscle flap was healthy.  The dacryon graft was mostly incorporated.  The PTFE graft is incorporated after approximately 3 cm of exposure.  The anastomosis between the PTFE and the Dacron appears to be well intact.  At completion after Pretorius muscle flap placement and wound VAC placement the wound was 9 x 5 cm.   Procedure:  The patient was identified in the holding area and taken to the operating room where she is placed supine on the operating table and general anesthesia was induced.  She was sterilely prepped and draped in the right groin in the usual fashion antibiotics were administered and a timeout was called.  We began with evaluating her wound the grafts all appear to be well incorporated.  We then performed pulse evacuation to a total of 3 L.  This time the groin look very healthy.  I mobilized the sartorius muscle using cautery off of the anterior superior iliac spine and then distally more bluntly.  This was able to be rotated over the graft easily and tacked medially with interrupted 2-0 Vicryl suture.  The wound again  was irrigated and wound VAC was fashioned and placed.  She was awakened from anesthesia having tolerated procedure without any complication.  All counts were correct at completion.  EBL: 25 cc   Erma Joubert C. Donzetta Matters, MD [facility] [place] Office: [phone] Pager: [phone]

## 2020-04-24 NOTE — Anesthesia Postprocedure Evaluation (Signed)
Anesthesia Post Note  Patient: [name]  Procedure(s) Performed: IRRIGATION AND DEBRIDEMENT, GROIN WOUND (Right Groin)     Patient location during evaluation: PACU Anesthesia Type: General Level of consciousness: awake and alert Pain management: pain level controlled Vital Signs Assessment: post-procedure vital signs reviewed and stable Respiratory status: spontaneous breathing, nonlabored ventilation, respiratory function stable and patient connected to nasal cannula oxygen Cardiovascular status: blood pressure returned to baseline and stable Postop Assessment: no apparent nausea or vomiting Anesthetic complications: no   No complications documented.  Last Vitals:  Vitals:   04/23/20 2309 04/10/2020 0400  BP: (!) 108/47 (!) 114/52  Pulse: 75 82  Resp: 12 14  Temp: 36.7 C 37 C  SpO2: 96% 97%    Last Pain:  Vitals:   04/11/2020 0400  TempSrc: Oral  PainSc:                  [place]

## 2020-04-24 NOTE — Progress Notes (Signed)
Pt returned back to room from PACU, wound vac intact.  VSS, tele set up.  Diet orders received.

## 2020-04-24 NOTE — Anesthesia Procedure Notes (Signed)
Procedure Name: LMA Insertion Date/Time: 04/08/2020 8:00 AM Performed by: Hoy Morn, CRNA Pre-anesthesia Checklist: Patient identified, Emergency Drugs available, Suction available and Patient being monitored Patient Re-evaluated:Patient Re-evaluated prior to induction Oxygen Delivery Method: Circle system utilized Preoxygenation: Pre-oxygenation with 100% oxygen Induction Type: IV induction Ventilation: Mask ventilation without difficulty LMA: LMA inserted LMA Size: 4.0 Number of attempts: 1

## 2020-04-24 NOTE — Anesthesia Postprocedure Evaluation (Signed)
Anesthesia Post Note  Patient: [name]  Procedure(s) Performed: Virl Son DEBRIDEMENT RIGHT (Right Groin)     Patient location during evaluation: PACU Anesthesia Type: General Level of consciousness: awake Pain management: pain level controlled Vital Signs Assessment: post-procedure vital signs reviewed and stable Respiratory status: spontaneous breathing Cardiovascular status: stable Postop Assessment: no apparent nausea or vomiting Anesthetic complications: no   No complications documented.  Last Vitals:  Vitals:   04/23/2020 0946 04/15/2020 1146  BP: (!) 95/43 (!) 105/43  Pulse: 87 85  Resp: 16 18  Temp: 36.4 C (!) 36.4 C  SpO2: 95% 99%    Last Pain:  Vitals:   04/13/2020 1212  TempSrc:   PainSc: Asleep                 Emilya Justen

## 2020-04-25 ENCOUNTER — Encounter (HOSPITAL_COMMUNITY): Payer: Self-pay | Admitting: Vascular Surgery

## 2020-04-25 DIAGNOSIS — L8990 Pressure ulcer of unspecified site, unspecified stage: Secondary | ICD-10-CM | POA: Insufficient documentation

## 2020-04-25 LAB — CBC
HCT: 28.8 % — ABNORMAL LOW (ref 36.0–46.0)
Hemoglobin: 9.2 g/dL — ABNORMAL LOW (ref 12.0–15.0)
MCH: 31 pg (ref 26.0–34.0)
MCHC: 31.9 g/dL (ref 30.0–36.0)
MCV: 97 fL (ref 80.0–100.0)
Platelets: 127 10*3/uL — ABNORMAL LOW (ref 150–400)
RBC: 2.97 MIL/uL — ABNORMAL LOW (ref 3.87–5.11)
RDW: 17.4 % — ABNORMAL HIGH (ref 11.5–15.5)
WBC: 13.8 10*3/uL — ABNORMAL HIGH (ref 4.0–10.5)
nRBC: 0.1 % (ref 0.0–0.2)

## 2020-04-25 LAB — GLUCOSE, CAPILLARY
Glucose-Capillary: 101 mg/dL — ABNORMAL HIGH (ref 70–99)
Glucose-Capillary: 93 mg/dL (ref 70–99)
Glucose-Capillary: 121 mg/dL — ABNORMAL HIGH (ref 70–99)
Glucose-Capillary: 120 mg/dL — ABNORMAL HIGH (ref 70–99)

## 2020-04-25 MED ORDER — CHLORHEXIDINE GLUCONATE CLOTH 2 % EX PADS
6.0000 | MEDICATED_PAD | Freq: Every day | CUTANEOUS | Status: DC
  Administered 2020-04-25 – 2020-05-03 (×6): 6 via TOPICAL

## 2020-04-25 MED ORDER — SODIUM CHLORIDE 0.9 % IV SOLN
1.0000 g | INTRAVENOUS | Status: DC
  Administered 2020-04-25 – 2020-05-11 (×16): 1 g via INTRAVENOUS
  Filled 2020-04-25 (×19): qty 1

## 2020-04-25 MED ORDER — MENTHOL 3 MG MT LOZG
1.0000 | LOZENGE | OROMUCOSAL | Status: DC | PRN
  Filled 2020-04-25: qty 9

## 2020-04-25 NOTE — Progress Notes (Signed)
[facility] Progress Note   Subjective:   Patient seen and examined at bedside.  Pain improved this AM.  Tolerating dialysis well yesterday.  Denies CP, SOB, abdominal pain, n/v/d, weakness and fatigue.   Objective Vitals:   04/25/20 0254 04/25/20 0408 04/25/20 0500 04/25/20 0755  BP:  (!) 103/43  (!) 96/58  Pulse:  74  77  Resp:  13  20  Temp:  98.7 F (37.1 C)  97.6 F (36.4 C)  TempSrc:  Oral  Oral  SpO2: 91% 94%  95%  Weight:   65.2 kg    Physical Exam General:WDWN female in NAD Heart:RRR Lungs:CTAB Abdomen:soft, NTND Extremities:no LE edema Dialysis Access: RU AVF +b   Filed Weights   04/14/2020 2135 04/25/20 0045 04/25/20 0500  Weight: 66.9 kg 65.2 kg 65.2 kg    Intake/Output Summary (Last 24 hours) at 04/25/2020 0941 Last data filed at 04/25/2020 0534 Gross per 24 hour  Intake 300 ml  Output 2300 ml  Net -2000 ml    Additional Objective Labs: Basic Metabolic Panel: Recent Labs  Lab 05/01/2020 2141 04/23/20 0415 05/07/2020 0744 05/02/2020 1947  NA 136 135 134* 134*  K 3.2* 3.1* 4.3 4.3  CL 96* 94* 94* 94*  CO2 26 25  --  25  GLUCOSE 76 89 158* 104*  BUN 40* 40* 51* 59*  CREATININE 4.41* 4.43* 5.00* 5.29*  CALCIUM 7.8* 7.8*  --  8.0*  PHOS  --   --   --  5.2*   Liver Function Tests: Recent Labs  Lab 05/05/2020 2141 04/29/2020 1947  AST 13*  --   ALT <5  --   ALKPHOS 108  --   BILITOT 0.8  --   PROT 4.9*  --   ALBUMIN 2.2* 2.2*   CBC: Recent Labs  Lab 04/11/2020 2141 04/23/20 0415 05/01/2020 0744 04/14/2020 1947  WBC 12.6* 13.6*  --  16.8*  NEUTROABS 10.7*  --   --   --   HGB 10.0* 9.0* 10.9* 8.4*  HCT 30.7* 28.3* 32.0* 25.8*  MCV 95.9 96.6  --  95.9  PLT 200 206  --  218   Blood Culture    Component Value Date/Time   SDES BLOOD LEFT HAND 04/23/2020 0415   SPECREQUEST  04/23/2020 0415    BOTTLES DRAWN AEROBIC AND ANAEROBIC Blood Culture adequate volume   CULT  04/23/2020 0415    NO GROWTH 2 DAYS Performed at [facility], [address] [place], [place] [address]    REPTSTATUS PENDING 04/23/2020 0415   CBG: Recent Labs  Lab 04/30/2020 0907 05/01/2020 1147 04/07/2020 1558 04/30/2020 2113 04/25/20 0604  GLUCAP 169* 173* 158* 107* 101*    Medications: . piperacillin-tazobactam (ZOSYN)  IV 2.25 g (04/25/20 0941)  . vancomycin Stopped (04/25/20 0034)   . allopurinol  100 mg Oral BID  . atorvastatin  10 mg Oral Daily  . cinacalcet  30 mg Oral Q supper  . collagenase   Topical Daily  . [START ON 05/03/2020] darbepoetin (ARANESP) injection - DIALYSIS  60 mcg Intravenous Q Thu-HD  . docusate sodium  100 mg Oral BID  . feeding supplement (NEPRO CARB STEADY)   Oral BID  . heparin  5,000 Units Subcutaneous Q8H  . hydrALAZINE  75 mg Oral TID  . insulin aspart  0-5 Units Subcutaneous QHS  . insulin aspart  0-9 Units Subcutaneous TID WC  . insulin aspart  3 Units Subcutaneous TID WC  . isosorbide dinitrate  10 mg Oral TID  . multivitamin  1 tablet Oral QHS  . pantoprazole  40 mg Oral Daily  . sevelamer carbonate  1,600 mg Oral TID WC  . traZODone  25-50 mg Oral QHS    Dialysis Orders: NWon TTS. MBO48.5 kgHD Bath 2K 2Ca 3 hours 45 minutes,no heparin. AccessRUA AVF Mircera 30 MCG every 2 weeks last on 01/13 units IV/HD Venofer100 mg loading needs 9 more doses   Assessment/Plan 1. Infected right groin surgical wound( DEC 2059femoropopliteal bypass surgery) - initial debridement 1/17 with additional washout 1/19, wound vac place with serosanguinous fluid. Dr. Donzetta Matters 2. ESRD - on HD schedule TTS. HD yesterday per regular schedule. K 4.3. No heparin 3. Hypertension/volume - BP soft today. Does not appear grossly volume overloaded.  UF as tolerated.  4. Anemia of CKD - Hgb 8.4 today. continue Aranesp next dose on Thursday, hold IV iron load with infection. 5. Metabolic bone disease -CCa in goal.Phos elevated,continue Renvela binder, Sensipar 30 daily 6. Nutrition - renal diet with  fluid restrictions, vitamins 7. History of CAD 8. History of systolic/diastolic heart failure   Jen Mow, PA-C [facility] 04/25/2020,9:41 AM  LOS: 2 days

## 2020-04-25 NOTE — Progress Notes (Signed)
BP this a.m. 96/58, scheduled hydrochlorothiazide held.

## 2020-04-25 NOTE — Progress Notes (Signed)
Patient has developed a hematoma of the right groin and thigh.  Wound Vac draining large amt of Serious fl. Doppler bilat. D.P. and P.T.. Groin is very tender to touch. Butch Penny R.N. into assess patient. Dr. Scot Dock on call and made aware and is coming to see patient.

## 2020-04-25 NOTE — Progress Notes (Addendum)
  Progress Note    04/25/2020 8:21 AM 1 Day Post-Op  Subjective: No complaints this morning   Vitals:   04/25/20 0408 04/25/20 0755  BP: (!) 103/43 (!) 96/58  Pulse: 74 77  Resp: 13 20  Temp: 98.7 F (37.1 C) 97.6 F (36.4 C)  SpO2: 94% 95%   Physical Exam: Lungs:  Non labored Incisions:  R groin with vac in place, good seal, no hematoma Extremities:  R ATA brisk by doppler; soft PTA signal by doppler Neurologic: A&O  CBC    Component Value Date/Time   WBC 16.8 (H) 05/04/2020 1947   RBC 2.69 (L) 04/23/2020 1947   HGB 8.4 (L) 04/11/2020 1947   HGB 11.7 05/18/2017 1218   HGB 12.3 02/02/2017 1136   HCT 25.8 (L) 04/11/2020 1947   HCT 34.7 05/18/2017 1218   HCT 37.4 02/02/2017 1136   PLT 218 04/19/2020 1947   PLT 227 05/18/2017 1218   MCV 95.9 04/11/2020 1947   MCV 87 05/18/2017 1218   MCV 90.6 02/02/2017 1136   MCH 31.2 04/12/2020 1947   MCHC 32.6 04/27/2020 1947   RDW 17.2 (H) 04/25/2020 1947   RDW 16.0 (H) 05/18/2017 1218   RDW 15.7 (H) 02/02/2017 1136   LYMPHSABS 1.1 04/10/2020 2141   LYMPHSABS 1.8 02/02/2017 1136   MONOABS 0.7 04/16/2020 2141   MONOABS 0.8 02/02/2017 1136   EOSABS 0.1 04/21/2020 2141   EOSABS 0.3 02/02/2017 1136   BASOSABS 0.0 04/30/2020 2141   BASOSABS 0.1 02/02/2017 1136    BMET    Component Value Date/Time   NA 134 (L) 04/23/2020 1947   NA 143 05/18/2017 1218   NA 143 02/02/2017 1137   K 4.3 05/02/2020 1947   K 3.8 02/02/2017 1137   CL 94 (L) 04/22/2020 1947   CO2 25 04/15/2020 1947   CO2 24 02/02/2017 1137   GLUCOSE 104 (H) 04/18/2020 1947   GLUCOSE 74 02/02/2017 1137   BUN 59 (H) 04/28/2020 1947   BUN 76 (HH) 05/18/2017 1218   BUN 63.4 (H) 02/02/2017 1137   CREATININE 5.29 (H) 04/23/2020 1947   CREATININE 5.12 (H) 02/13/2020 1518   CREATININE 3.0 (HH) 02/02/2017 1137   CALCIUM 8.0 (L) 04/25/2020 1947   CALCIUM 9.9 02/02/2017 1137   GFRNONAA 8 (L) 05/06/2020 1947   GFRNONAA 8 (L) 02/13/2020 1518   GFRAA 9 (L)  02/13/2020 1518    INR    Component Value Date/Time   INR 1.6 (H) 04/21/2020 2141     Intake/Output Summary (Last 24 hours) at 04/25/2020 5361 Last data filed at 04/25/2020 0534 Gross per 24 hour  Intake 300 ml  Output 2310 ml  Net -2010 ml     Assessment/Plan:  75 y.o. female is s/p R groin debridement and sartorius muscle flap  1 Day Post-Op   RLE well perfused based on doppler exam Encouraged patient to offload R heel Bright red blood in vac cannister but no hematoma or bleeding through sponge; check CBC ESRD on HD per Nephrology   Dagoberto Ligas, PA-C [facility] [phone] 04/25/2020 8:21 AM   I have independently interviewed and examined patient and agree with PA assessment and plan above. She had some blood in the canister this morning but her H&H is stable has not filled up significantly today. We will plan to change wound VAC tomorrow.  Jamare Vanatta C. Donzetta Matters, MD [facility] [place] Office: [phone] Pager: [phone]

## 2020-04-25 NOTE — Progress Notes (Signed)
BP 102/46, hydralazine held, Vascular PA notified.  Also notified that I have been holding meal coverage insulin, as pt's blood sugars have been WNL and decreased appetite.

## 2020-04-26 LAB — GLUCOSE, CAPILLARY
Glucose-Capillary: 108 mg/dL — ABNORMAL HIGH (ref 70–99)
Glucose-Capillary: 127 mg/dL — ABNORMAL HIGH (ref 70–99)
Glucose-Capillary: 100 mg/dL — ABNORMAL HIGH (ref 70–99)

## 2020-04-26 LAB — CBC
HCT: 26.9 % — ABNORMAL LOW (ref 36.0–46.0)
Hemoglobin: 9.1 g/dL — ABNORMAL LOW (ref 12.0–15.0)
MCH: 32.2 pg (ref 26.0–34.0)
MCHC: 33.8 g/dL (ref 30.0–36.0)
MCV: 95.1 fL (ref 80.0–100.0)
Platelets: 216 10*3/uL (ref 150–400)
RBC: 2.83 MIL/uL — ABNORMAL LOW (ref 3.87–5.11)
RDW: 17.2 % — ABNORMAL HIGH (ref 11.5–15.5)
WBC: 12.2 10*3/uL — ABNORMAL HIGH (ref 4.0–10.5)
nRBC: 0.2 % (ref 0.0–0.2)

## 2020-04-26 LAB — RENAL FUNCTION PANEL
Albumin: 1.9 g/dL — ABNORMAL LOW (ref 3.5–5.0)
Anion gap: 14 (ref 5–15)
BUN: 52 mg/dL — ABNORMAL HIGH (ref 8–23)
CO2: 24 mmol/L (ref 22–32)
Calcium: 7.3 mg/dL — ABNORMAL LOW (ref 8.9–10.3)
Chloride: 96 mmol/L — ABNORMAL LOW (ref 98–111)
Creatinine, Ser: 5.51 mg/dL — ABNORMAL HIGH (ref 0.44–1.00)
GFR, Estimated: 8 mL/min — ABNORMAL LOW (ref >60)
Glucose, Bld: 115 mg/dL — ABNORMAL HIGH (ref 70–99)
Phosphorus: 4.5 mg/dL (ref 2.5–4.6)
Potassium: 4.3 mmol/L (ref 3.5–5.1)
Sodium: 134 mmol/L — ABNORMAL LOW (ref 135–145)

## 2020-04-26 NOTE — Progress Notes (Signed)
[facility] Progress Note   Subjective:   Patient seen and examined at bedside.  Sitting up and eating breakfast.  Admits to soreness at her groin.  Otherwise doing ok. Denies CP, SOB, n/v/d and abdominal pain.  Objective Vitals:   04/25/20 2107 04/26/20 0333 04/26/20 0619 04/26/20 0822  BP: 106/60 (!) 104/46  116/74  Pulse: 80 80  90  Resp: 16 16 20 18   Temp: 97.7 F (36.5 C) 98 F (36.7 C)  97.7 F (36.5 C)  TempSrc: Oral Oral  Oral  SpO2: 97%   100%  Weight:   68.7 kg    Physical Exam General:chronically ill appearing female in NAD Heart:RRR Lungs:CTAB Abdomen:soft, NTND Extremities:trace edema on R - wound vac in place R groin, no edema on L Dialysis Access: RU AVF +b   Filed Weights   04/25/20 0045 04/25/20 0500 04/26/20 0619  Weight: 65.2 kg 65.2 kg 68.7 kg    Intake/Output Summary (Last 24 hours) at 04/26/2020 0943 Last data filed at 04/26/2020 8768 Gross per 24 hour  Intake 240 ml  Output 100 ml  Net 140 ml    Additional Objective Labs: Basic Metabolic Panel: Recent Labs  Lab 04/26/2020 2141 04/23/20 0415 04/15/2020 0744 05/05/2020 1947  NA 136 135 134* 134*  K 3.2* 3.1* 4.3 4.3  CL 96* 94* 94* 94*  CO2 26 25  --  25  GLUCOSE 76 89 158* 104*  BUN 40* 40* 51* 59*  CREATININE 4.41* 4.43* 5.00* 5.29*  CALCIUM 7.8* 7.8*  --  8.0*  PHOS  --   --   --  5.2*   Liver Function Tests: Recent Labs  Lab 04/29/2020 2141 04/08/2020 1947  AST 13*  --   ALT <5  --   ALKPHOS 108  --   BILITOT 0.8  --   PROT 4.9*  --   ALBUMIN 2.2* 2.2*   CBC: Recent Labs  Lab 05/02/2020 2141 04/23/20 0415 04/29/2020 0744 04/11/2020 1947 04/25/20 1205  WBC 12.6* 13.6*  --  16.8* 13.8*  NEUTROABS 10.7*  --   --   --   --   HGB 10.0* 9.0* 10.9* 8.4* 9.2*  HCT 30.7* 28.3* 32.0* 25.8* 28.8*  MCV 95.9 96.6  --  95.9 97.0  PLT 200 206  --  218 127*    CBG: Recent Labs  Lab 04/25/20 0604 04/25/20 1138 04/25/20 1629 04/25/20 2103 04/26/20 0615  GLUCAP 101*  93 121* 120* 108*   Medications: . ceFEPime (MAXIPIME) IV 1 g (04/25/20 1423)   . allopurinol  100 mg Oral BID  . atorvastatin  10 mg Oral Daily  . Chlorhexidine Gluconate Cloth  6 each Topical Q0600  . cinacalcet  30 mg Oral Q supper  . collagenase   Topical Daily  . [START ON 05/03/2020] darbepoetin (ARANESP) injection - DIALYSIS  60 mcg Intravenous Q Thu-HD  . docusate sodium  100 mg Oral BID  . feeding supplement (NEPRO CARB STEADY)   Oral BID  . heparin  5,000 Units Subcutaneous Q8H  . hydrALAZINE  75 mg Oral TID  . insulin aspart  0-5 Units Subcutaneous QHS  . insulin aspart  0-9 Units Subcutaneous TID WC  . insulin aspart  3 Units Subcutaneous TID WC  . isosorbide dinitrate  10 mg Oral TID  . multivitamin  1 tablet Oral QHS  . pantoprazole  40 mg Oral Daily  . sevelamer carbonate  1,600 mg Oral TID WC  . traZODone  25-50 mg  Oral QHS    Dialysis Orders: NWon TTS. TOI71.2 kgHD Bath 2K 2Ca 3 hours 45 minutes,no heparin. AccessRUA AVF Mircera 30 MCG every 2 weeks last on 01/13 units IV/HD Venofer100 mg loading needs 9 more doses   Assessment/Plan 1. Infected right groin surgical wound( DEC 2035femoropopliteal bypass surgery)- initial debridement 1/17 with additional washout 1/18 by Dr. Donzetta Matters, wound vac changed this AM. Per [facility] 2. ESRD- onHD schedule TTS. HD today per regular schedule. K 4.3. No heparin 3. Hypertension/volume- BP soft.  Antihypertensives have been held. Does not appear grossly volume overloaded. UF as tolerated.  4. Anemiaof CKD - Hgb^9.2.continue Aranesp next dose on Thursday, hold IV iron load with infection. 5. Metabolic bone disease -CCa in goal.Phoselevated,continue Renvela binder, Sensipar 30 daily 6. Nutrition - renal diet with fluid restrictions, vitamins 7. History of CAD 8. History of systolic/diastolic heart failure   Jen Mow, PA-C [facility] 04/26/2020,9:43 AM  LOS: 3 days

## 2020-04-26 NOTE — Progress Notes (Addendum)
Progress Note    04/26/2020 8:00 AM 2 Days Post-Op  Subjective: no major complaints. Right groin sore. Sitting up eating breakfast   Vitals:   04/26/20 0333 04/26/20 0619  BP: (!) 104/46   Pulse: 80   Resp: 16 20  Temp: 98 F (36.7 C)   SpO2:     Physical Exam: Cardiac:  regular Lungs: non labored Incisions:  Right groin wound vac with good seal. There is a stable hematoma in the proximal right thigh which is marked Extremities: BLE well perfused and warm. Doppler AT brisk and Pero, PT monophasic on Right. Brisk L AT/ PT on left Abdomen:  Obese, soft, non tender Neurologic: alert and oriented  CBC    Component Value Date/Time   WBC 13.8 (H) 04/25/2020 1205   RBC 2.97 (L) 04/25/2020 1205   HGB 9.2 (L) 04/25/2020 1205   HGB 11.7 05/18/2017 1218   HGB 12.3 02/02/2017 1136   HCT 28.8 (L) 04/25/2020 1205   HCT 34.7 05/18/2017 1218   HCT 37.4 02/02/2017 1136   PLT 127 (L) 04/25/2020 1205   PLT 227 05/18/2017 1218   MCV 97.0 04/25/2020 1205   MCV 87 05/18/2017 1218   MCV 90.6 02/02/2017 1136   MCH 31.0 04/25/2020 1205   MCHC 31.9 04/25/2020 1205   RDW 17.4 (H) 04/25/2020 1205   RDW 16.0 (H) 05/18/2017 1218   RDW 15.7 (H) 02/02/2017 1136   LYMPHSABS 1.1 04/10/2020 2141   LYMPHSABS 1.8 02/02/2017 1136   MONOABS 0.7 05/06/2020 2141   MONOABS 0.8 02/02/2017 1136   EOSABS 0.1 04/28/2020 2141   EOSABS 0.3 02/02/2017 1136   BASOSABS 0.0 05/05/2020 2141   BASOSABS 0.1 02/02/2017 1136    BMET    Component Value Date/Time   NA 134 (L) 04/14/2020 1947   NA 143 05/18/2017 1218   NA 143 02/02/2017 1137   K 4.3 04/22/2020 1947   K 3.8 02/02/2017 1137   CL 94 (L) 04/29/2020 1947   CO2 25 05/05/2020 1947   CO2 24 02/02/2017 1137   GLUCOSE 104 (H) 04/13/2020 1947   GLUCOSE 74 02/02/2017 1137   BUN 59 (H) 04/29/2020 1947   BUN 76 (HH) 05/18/2017 1218   BUN 63.4 (H) 02/02/2017 1137   CREATININE 5.29 (H) 04/14/2020 1947   CREATININE 5.12 (H) 02/13/2020 1518    CREATININE 3.0 (HH) 02/02/2017 1137   CALCIUM 8.0 (L) 04/28/2020 1947   CALCIUM 9.9 02/02/2017 1137   GFRNONAA 8 (L) 04/15/2020 1947   GFRNONAA 8 (L) 02/13/2020 1518   GFRAA 9 (L) 02/13/2020 1518    INR    Component Value Date/Time   INR 1.6 (H) 04/26/2020 2141     Intake/Output Summary (Last 24 hours) at 04/26/2020 0800 Last data filed at 04/26/2020 9678 Gross per 24 hour  Intake 240 ml  Output 200 ml  Net 40 ml     Assessment/Plan:  75 y.o. female is s/p R groin debridement and sartorius muscle flap  2 Days Post-Op. Doing well post op. Tolerating diet. Afebrile. Blood pressure soft. Holding  home BP meds. BLE well perfused and warm. Doppler AT brisk and Pero, PT monophasic on Right. Brisk L AT/ PT on left. 200 cc of serosanguinous drainage in vac cannister. No signs of bleeding. Morning labs pending. CBC stable yesterday. Will change VAC later this morning. HD per nephrology   DVT prophylaxis:  Sq heparin   Karoline Caldwell, PA-C [facility] [phone] 04/26/2020 8:00 AM   Addendum: Right  groin wound VAC changed. Wound as shown below. Bleeding present in wound bed. No purulence. Some epidermolysis of inferior wound edge. Tissue in wound otherwise healthy appearing. Single layer of black foam replaced and VAC with good seal    I have independently interviewed and examined patient and agree with PA assessment and plan above.   Vladimir Lenhoff C. Donzetta Matters, MD [facility] [place] Office: [phone] Pager: [phone]

## 2020-04-27 LAB — CULTURE, BLOOD (ROUTINE X 2): Culture: NO GROWTH

## 2020-04-27 LAB — GLUCOSE, CAPILLARY
Glucose-Capillary: 89 mg/dL (ref 70–99)
Glucose-Capillary: 100 mg/dL — ABNORMAL HIGH (ref 70–99)
Glucose-Capillary: 101 mg/dL — ABNORMAL HIGH (ref 70–99)
Glucose-Capillary: 111 mg/dL — ABNORMAL HIGH (ref 70–99)

## 2020-04-27 MED ORDER — CHLORHEXIDINE GLUCONATE CLOTH 2 % EX PADS
6.0000 | MEDICATED_PAD | Freq: Every day | CUTANEOUS | Status: DC
  Administered 2020-04-27 – 2020-04-30 (×4): 6 via TOPICAL

## 2020-04-27 NOTE — Progress Notes (Signed)
[facility] Progress Note   Subjective:   Patient seen and examined at bedside.  Pain mostly well controlled today.  Denies CP, SOB, abdominal pain and n/v/d.   Objective Vitals:   04/26/20 2356 04/27/20 0423 04/27/20 0639 04/27/20 0820  BP: (!) 110/48 (!) 114/51  (!) 131/57  Pulse: 83 86  87  Resp: 13 20 12 16   Temp: 97.6 F (36.4 C) 97.7 F (36.5 C)  97.6 F (36.4 C)  TempSrc: Oral Oral  Oral  SpO2: 95% 98%  100%  Weight:   62.6 kg    Physical Exam General:chronically ill appearing female in NAD Heart:RRR, no mrg Lungs:CTAB, nml WOB on RA Abdomen: soft, NTND Extremities:no LE edema, wound vac in place on R groin, L toes wrapped Dialysis Access: RU AVF +b   Filed Weights   04/26/20 1525 04/26/20 1815 04/27/20 0639  Weight: 65.2 kg 63.2 kg 62.6 kg    Intake/Output Summary (Last 24 hours) at 04/27/2020 0925 Last data filed at 04/27/2020 0200 Gross per 24 hour  Intake --  Output 2100 ml  Net -2100 ml    Additional Objective Labs: Basic Metabolic Panel: Recent Labs  Lab 04/23/20 0415 05/06/2020 0744 04/18/2020 1947 04/26/20 1545  NA 135 134* 134* 134*  K 3.1* 4.3 4.3 4.3  CL 94* 94* 94* 96*  CO2 25  --  25 24  GLUCOSE 89 158* 104* 115*  BUN 40* 51* 59* 52*  CREATININE 4.43* 5.00* 5.29* 5.51*  CALCIUM 7.8*  --  8.0* 7.3*  PHOS  --   --  5.2* 4.5   Liver Function Tests: Recent Labs  Lab 04/27/2020 2141 04/14/2020 1947 04/26/20 1545  AST 13*  --   --   ALT <5  --   --   ALKPHOS 108  --   --   BILITOT 0.8  --   --   PROT 4.9*  --   --   ALBUMIN 2.2* 2.2* 1.9*   CBC: Recent Labs  Lab 04/27/2020 2141 04/23/20 0415 04/11/2020 0744 05/03/2020 1947 04/25/20 1205 04/26/20 0830  WBC 12.6* 13.6*  --  16.8* 13.8* 12.2*  NEUTROABS 10.7*  --   --   --   --   --   HGB 10.0* 9.0*   < > 8.4* 9.2* 9.1*  HCT 30.7* 28.3*   < > 25.8* 28.8* 26.9*  MCV 95.9 96.6  --  95.9 97.0 95.1  PLT 200 206  --  218 127* 216   < > = values in this interval not  displayed.   Blood Culture    Component Value Date/Time   SDES BLOOD LEFT HAND 04/23/2020 0415   SPECREQUEST  04/23/2020 0415    BOTTLES DRAWN AEROBIC AND ANAEROBIC Blood Culture adequate volume   CULT  04/23/2020 0415    NO GROWTH 3 DAYS Performed at [facility], [place] [address], [place], [place] [address]    REPTSTATUS PENDING 04/23/2020 0415   CBG: Recent Labs  Lab 04/25/20 2103 04/26/20 0615 04/26/20 1207 04/26/20 2104 04/27/20 0615  GLUCAP 120* 108* 127* 100* 89    Medications:  ceFEPime (MAXIPIME) IV 1 g (04/25/20 1423)    allopurinol  100 mg Oral BID   atorvastatin  10 mg Oral Daily   Chlorhexidine Gluconate Cloth  6 each Topical Q0600   cinacalcet  30 mg Oral Q supper   collagenase   Topical Daily   [START ON 05/03/2020] darbepoetin (ARANESP) injection - DIALYSIS  60 mcg  Intravenous Q Thu-HD   docusate sodium  100 mg Oral BID   feeding supplement (NEPRO CARB STEADY)   Oral BID   heparin  5,000 Units Subcutaneous Q8H   hydrALAZINE  75 mg Oral TID   insulin aspart  0-5 Units Subcutaneous QHS   insulin aspart  0-9 Units Subcutaneous TID WC   insulin aspart  3 Units Subcutaneous TID WC   isosorbide dinitrate  10 mg Oral TID   multivitamin  1 tablet Oral QHS   pantoprazole  40 mg Oral Daily   sevelamer carbonate  1,600 mg Oral TID WC   traZODone  25-50 mg Oral QHS    Dialysis Orders: [facility] T[facility]. HKG67.7 kgHD Bath 2K 2Ca 3 hours 45 minutes,no heparin. AccessRUA AVF Mircera 30 MCG every 2 weeks last on 01/13 units IV/HD Venofer100 mg loading needs 9 more doses   Assessment/Plan 1. Infected right groin surgical wound( DEC 208femoropopliteal bypass surgery)- initial debridement1/17with additional washout 1/18 by Dr. Donzetta Matters. Per [facility] 2. ESRD- onHD TTS.HD tomorrow per regular schedule.last K 4.3. No heparin 3. Hypertension/volume- BP soft.  Antihypertensives have been held. Does not appear grossly volume overloaded.  UF as tolerated.  4. Anemiaof CKD - Hgb^9.1.continue Aranesp next dose on 1/27, holdIViron load with infection. 5. Metabolic bone disease -CCa and phos in goal.continue Renvela binder, Sensipar 30 daily 6. Nutrition - renal diet with fluid restrictions, vitamins 7. History of CAD 8. History of systolic/diastolic heart failure    Jen Mow, PA-C [facility] 04/27/2020,9:25 AM  LOS: 4 days

## 2020-04-27 NOTE — Progress Notes (Addendum)
Progress Note    04/27/2020 7:53 AM 3 Days Post-Op  Subjective:  Pt sleeping-wakes easily.  Says she is not having any pain in her groin currently.  Afebrile HR 80's  809'X-833'A systolic 98 %RA  Abx:  Cefepime   Vitals:   04/27/20 0423 04/27/20 0639  BP: (!) 114/51   Pulse: 86   Resp: 20 12  Temp: 97.7 F (36.5 C)   SpO2: 98%     Physical Exam: Cardiac:  regular Lungs:  Non labored Incisions:  Right groin with wound vac with good seal.  Extremities:  Brisk bilateral AT doppler signals       CBC    Component Value Date/Time   WBC 12.2 (H) 04/26/2020 0830   RBC 2.83 (L) 04/26/2020 0830   HGB 9.1 (L) 04/26/2020 0830   HGB 11.7 05/18/2017 1218   HGB 12.3 02/02/2017 1136   HCT 26.9 (L) 04/26/2020 0830   HCT 34.7 05/18/2017 1218   HCT 37.4 02/02/2017 1136   PLT 216 04/26/2020 0830   PLT 227 05/18/2017 1218   MCV 95.1 04/26/2020 0830   MCV 87 05/18/2017 1218   MCV 90.6 02/02/2017 1136   MCH 32.2 04/26/2020 0830   MCHC 33.8 04/26/2020 0830   RDW 17.2 (H) 04/26/2020 0830   RDW 16.0 (H) 05/18/2017 1218   RDW 15.7 (H) 02/02/2017 1136   LYMPHSABS 1.1 04/07/2020 2141   LYMPHSABS 1.8 02/02/2017 1136   MONOABS 0.7 04/23/2020 2141   MONOABS 0.8 02/02/2017 1136   EOSABS 0.1 04/19/2020 2141   EOSABS 0.3 02/02/2017 1136   BASOSABS 0.0 04/21/2020 2141   BASOSABS 0.1 02/02/2017 1136    BMET    Component Value Date/Time   NA 134 (L) 04/26/2020 1545   NA 143 05/18/2017 1218   NA 143 02/02/2017 1137   K 4.3 04/26/2020 1545   K 3.8 02/02/2017 1137   CL 96 (L) 04/26/2020 1545   CO2 24 04/26/2020 1545   CO2 24 02/02/2017 1137   GLUCOSE 115 (H) 04/26/2020 1545   GLUCOSE 74 02/02/2017 1137   BUN 52 (H) 04/26/2020 1545   BUN 76 (HH) 05/18/2017 1218   BUN 63.4 (H) 02/02/2017 1137   CREATININE 5.51 (H) 04/26/2020 1545   CREATININE 5.12 (H) 02/13/2020 1518   CREATININE 3.0 (HH) 02/02/2017 1137   CALCIUM 7.3 (L) 04/26/2020 1545   CALCIUM 9.9 02/02/2017 1137    GFRNONAA 8 (L) 04/26/2020 1545   GFRNONAA 8 (L) 02/13/2020 1518   GFRAA 9 (L) 02/13/2020 1518    INR    Component Value Date/Time   INR 1.6 (H) 04/21/2020 2141     Intake/Output Summary (Last 24 hours) at 04/27/2020 0753 Last data filed at 04/27/2020 0200 Gross per 24 hour  Intake --  Output 2100 ml  Net -2100 ml     Assessment:  75 y.o. female is s/p:  R groin debridement and sartorius muscle flap   3 Days Post-Op  Plan: -pt with brisk doppler signals bilateral AT signals.   -wound vac changed yesterday and has good seal -dressing removed off of right foot-continue santyl dressing to lateral foot wound and please float heel off bed. -OOB to chair -HD per renal -DVT prophylaxis:  Sq heparin   Leontine Locket, PA-C [facility] [phone] 04/27/2020 7:53 AM  I have independently interviewed and examined patient and agree with PA assessment and plan above. Will need wound vac changed in next 2-3 days. Continue to mobilize.   Lisandra Mathisen C. Donzetta Matters,  MD [facility] [place] Office: [phone] Pager: [phone]

## 2020-04-27 NOTE — Care Management Important Message (Signed)
Important Message  Patient Details  Name: [name] MRN: [id number] Date of Birth: Nov 02, 1945   Medicare Important Message Given:  Yes     [name] 04/27/2020, 10:55 AM

## 2020-04-28 LAB — RENAL FUNCTION PANEL
Albumin: 2.1 g/dL — ABNORMAL LOW (ref 3.5–5.0)
Anion gap: 17 — ABNORMAL HIGH (ref 5–15)
BUN: 52 mg/dL — ABNORMAL HIGH (ref 8–23)
CO2: 18 mmol/L — ABNORMAL LOW (ref 22–32)
Calcium: 7.6 mg/dL — ABNORMAL LOW (ref 8.9–10.3)
Chloride: 97 mmol/L — ABNORMAL LOW (ref 98–111)
Creatinine, Ser: 5.86 mg/dL — ABNORMAL HIGH (ref 0.44–1.00)
GFR, Estimated: 7 mL/min — ABNORMAL LOW (ref >60)
Glucose, Bld: 113 mg/dL — ABNORMAL HIGH (ref 70–99)
Phosphorus: 5.3 mg/dL — ABNORMAL HIGH (ref 2.5–4.6)
Potassium: 4.8 mmol/L (ref 3.5–5.1)
Sodium: 132 mmol/L — ABNORMAL LOW (ref 135–145)

## 2020-04-28 LAB — CBC
HCT: 28.6 % — ABNORMAL LOW (ref 36.0–46.0)
HCT: 27.1 % — ABNORMAL LOW (ref 36.0–46.0)
Hemoglobin: 9.1 g/dL — ABNORMAL LOW (ref 12.0–15.0)
Hemoglobin: 9.1 g/dL — ABNORMAL LOW (ref 12.0–15.0)
MCH: 31.2 pg (ref 26.0–34.0)
MCH: 32.4 pg (ref 26.0–34.0)
MCHC: 31.8 g/dL (ref 30.0–36.0)
MCHC: 33.6 g/dL (ref 30.0–36.0)
MCV: 97.9 fL (ref 80.0–100.0)
MCV: 96.4 fL (ref 80.0–100.0)
Platelets: 246 10*3/uL (ref 150–400)
Platelets: 256 10*3/uL (ref 150–400)
RBC: 2.92 MIL/uL — ABNORMAL LOW (ref 3.87–5.11)
RBC: 2.81 MIL/uL — ABNORMAL LOW (ref 3.87–5.11)
RDW: 18.2 % — ABNORMAL HIGH (ref 11.5–15.5)
RDW: 18.2 % — ABNORMAL HIGH (ref 11.5–15.5)
WBC: 12.4 10*3/uL — ABNORMAL HIGH (ref 4.0–10.5)
WBC: 11 10*3/uL — ABNORMAL HIGH (ref 4.0–10.5)
nRBC: 0.6 % — ABNORMAL HIGH (ref 0.0–0.2)
nRBC: 0.5 % — ABNORMAL HIGH (ref 0.0–0.2)

## 2020-04-28 LAB — GLUCOSE, CAPILLARY
Glucose-Capillary: 98 mg/dL (ref 70–99)
Glucose-Capillary: 113 mg/dL — ABNORMAL HIGH (ref 70–99)
Glucose-Capillary: 101 mg/dL — ABNORMAL HIGH (ref 70–99)
Glucose-Capillary: 90 mg/dL (ref 70–99)

## 2020-04-28 LAB — AEROBIC/ANAEROBIC CULTURE W GRAM STAIN (SURGICAL/DEEP WOUND)

## 2020-04-28 LAB — CULTURE, BLOOD (ROUTINE X 2)
Culture: NO GROWTH
Special Requests: ADEQUATE

## 2020-04-28 MED ORDER — LIDOCAINE HCL (PF) 1 % IJ SOLN: 5.0000 mL | INTRAMUSCULAR | Status: DC | PRN

## 2020-04-28 MED ORDER — LIDOCAINE-PRILOCAINE 2.5-2.5 % EX CREA: 1.0000 "application " | TOPICAL_CREAM | CUTANEOUS | Status: DC | PRN

## 2020-04-28 MED ORDER — SODIUM CHLORIDE 0.9 % IV SOLN: 100.0000 mL | INTRAVENOUS | Status: DC | PRN

## 2020-04-28 MED ORDER — ALTEPLASE 2 MG IJ SOLR: 2.0000 mg | Freq: Once | INTRAMUSCULAR | Status: DC | PRN

## 2020-04-28 MED ORDER — PENTAFLUOROPROP-TETRAFLUOROETH EX AERO: 1.0000 "application " | INHALATION_SPRAY | CUTANEOUS | Status: DC | PRN

## 2020-04-28 MED ORDER — HEPARIN SODIUM (PORCINE) 1000 UNIT/ML DIALYSIS: 1000.0000 [IU] | INTRAMUSCULAR | Status: DC | PRN

## 2020-04-28 NOTE — Progress Notes (Signed)
[facility] Progress Note   Subjective:  Seen in room - bloody drainage in wound vac cannister. No CP or dyspnea.   Objective Vitals:   04/27/20 2022 04/28/20 0120 04/28/20 0430 04/28/20 0839  BP: 120/68 (!) 106/46 127/62 (!) 144/83  Pulse: 65 83 86 92  Resp: 19 15 14 20   Temp: 97.6 F (36.4 C) 97.8 F (36.6 C) 97.9 F (36.6 C) 98.1 F (36.7 C)  TempSrc: Oral Oral Axillary Axillary  SpO2: 100% 99% 100% 100%  Weight:       Physical Exam General: Chronically ill appearing woman, NAD. Heart: RRR; no murmur Lungs: CTAB; no rales Abdomen: soft, non-tender Extremities: No LE edema; wound vac to R groin, L toes bandaged. 1+ UE edema Dialysis Access: R forearm AVF + bruit  Additional Objective Labs: Basic Metabolic Panel: Recent Labs  Lab 04/23/20 0415 04/16/2020 0744 04/21/2020 1947 04/26/20 1545  NA 135 134* 134* 134*  K 3.1* 4.3 4.3 4.3  CL 94* 94* 94* 96*  CO2 25  --  25 24  GLUCOSE 89 158* 104* 115*  BUN 40* 51* 59* 52*  CREATININE 4.43* 5.00* 5.29* 5.51*  CALCIUM 7.8*  --  8.0* 7.3*  PHOS  --   --  5.2* 4.5   Liver Function Tests: Recent Labs  Lab 04/23/2020 2141 04/25/2020 1947 04/26/20 1545  AST 13*  --   --   ALT <5  --   --   ALKPHOS 108  --   --   BILITOT 0.8  --   --   PROT 4.9*  --   --   ALBUMIN 2.2* 2.2* 1.9*   CBC: Recent Labs  Lab 04/21/2020 2141 04/23/20 0415 04/29/2020 0744 04/23/2020 1947 04/25/20 1205 04/26/20 0830 04/28/20 0800  WBC 12.6* 13.6*  --  16.8* 13.8* 12.2* 12.4*  NEUTROABS 10.7*  --   --   --   --   --   --   HGB 10.0* 9.0*   < > 8.4* 9.2* 9.1* 9.1*  HCT 30.7* 28.3*   < > 25.8* 28.8* 26.9* 28.6*  MCV 95.9 96.6  --  95.9 97.0 95.1 97.9  PLT 200 206  --  218 127* 216 246   < > = values in this interval not displayed.   Medications: . ceFEPime (MAXIPIME) IV 1 g (04/27/20 1546)   . allopurinol  100 mg Oral BID  . atorvastatin  10 mg Oral Daily  . Chlorhexidine Gluconate Cloth  6 each Topical Q0600  .  Chlorhexidine Gluconate Cloth  6 each Topical Q0600  . cinacalcet  30 mg Oral Q supper  . collagenase   Topical Daily  . [START ON 05/03/2020] darbepoetin (ARANESP) injection - DIALYSIS  60 mcg Intravenous Q Thu-HD  . docusate sodium  100 mg Oral BID  . feeding supplement (NEPRO CARB STEADY)   Oral BID  . heparin  5,000 Units Subcutaneous Q8H  . hydrALAZINE  75 mg Oral TID  . insulin aspart  0-5 Units Subcutaneous QHS  . insulin aspart  0-9 Units Subcutaneous TID WC  . insulin aspart  3 Units Subcutaneous TID WC  . isosorbide dinitrate  10 mg Oral TID  . multivitamin  1 tablet Oral QHS  . pantoprazole  40 mg Oral Daily  . sevelamer carbonate  1,600 mg Oral TID WC  . traZODone  25-50 mg Oral QHS    Dialysis Orders: [facility] TTS ZDG38.7 kgHD Bath 2K 2Ca 3 hours 45 minutes,no heparin. AccessRUA AVF  Mircera 30 MCG every 2 weeks last on 01/13 units IV/HD Venofer100 mg loading needs 9 more doses   Assessment/Plan: 1. 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 S/p initial debridement1/17, then washout 1/18 by Dr. Donzetta Matters. Per [facility]. 2. ESRD: Continue HD per usual TTS schedule - HD today. No heparin. 3. Hypertension/volume- BP stable, meds on hold.Does not appear grossly volume overloaded. UF as tolerated.  4. Anemiaof CKD: Hgb 9.1, continue Aranesp q Thursday (next dose 1/27). HoldingIViron load with infection. 5. Metabolic bone disease -CCa and phos in goal.continue Renvela binder, Sensipar 30 daily 6. Nutrition - renal diet with fluid restrictions, vitamins, Nepro 7. History of CAD 8. History of systolic/diastolic heart failure   Veneta Penton, PA-C 04/28/2020, 9:45 AM  [employer]

## 2020-04-28 NOTE — Progress Notes (Addendum)
  Progress Note  04/28/2020 8:08 AM 4 Days Post-Op  Subjective:  No complaints; R groin pain to touch   Vitals:   04/28/20 0120 04/28/20 0430  BP: (!) 106/46 127/62  Pulse: 83 86  Resp: 15 14  Temp: 97.8 F (36.6 C) 97.9 F (36.6 C)  SpO2: 99% 100%   Physical Exam: Lungs:  Non labored Incisions:  R groin incision with vac in place; fullness medially Extremities:  R foot wounds stable; R AT brisk by doppler; soft PT signal by doppler Neurologic: A&O  CBC    Component Value Date/Time   WBC 12.2 (H) 04/26/2020 0830   RBC 2.83 (L) 04/26/2020 0830   HGB 9.1 (L) 04/26/2020 0830   HGB 11.7 05/18/2017 1218   HGB 12.3 02/02/2017 1136   HCT 26.9 (L) 04/26/2020 0830   HCT 34.7 05/18/2017 1218   HCT 37.4 02/02/2017 1136   PLT 216 04/26/2020 0830   PLT 227 05/18/2017 1218   MCV 95.1 04/26/2020 0830   MCV 87 05/18/2017 1218   MCV 90.6 02/02/2017 1136   MCH 32.2 04/26/2020 0830   MCHC 33.8 04/26/2020 0830   RDW 17.2 (H) 04/26/2020 0830   RDW 16.0 (H) 05/18/2017 1218   RDW 15.7 (H) 02/02/2017 1136   LYMPHSABS 1.1 05/03/2020 2141   LYMPHSABS 1.8 02/02/2017 1136   MONOABS 0.7 04/13/2020 2141   MONOABS 0.8 02/02/2017 1136   EOSABS 0.1 04/10/2020 2141   EOSABS 0.3 02/02/2017 1136   BASOSABS 0.0 04/27/2020 2141   BASOSABS 0.1 02/02/2017 1136    BMET    Component Value Date/Time   NA 134 (L) 04/26/2020 1545   NA 143 05/18/2017 1218   NA 143 02/02/2017 1137   K 4.3 04/26/2020 1545   K 3.8 02/02/2017 1137   CL 96 (L) 04/26/2020 1545   CO2 24 04/26/2020 1545   CO2 24 02/02/2017 1137   GLUCOSE 115 (H) 04/26/2020 1545   GLUCOSE 74 02/02/2017 1137   BUN 52 (H) 04/26/2020 1545   BUN 76 (HH) 05/18/2017 1218   BUN 63.4 (H) 02/02/2017 1137   CREATININE 5.51 (H) 04/26/2020 1545   CREATININE 5.12 (H) 02/13/2020 1518   CREATININE 3.0 (HH) 02/02/2017 1137   CALCIUM 7.3 (L) 04/26/2020 1545   CALCIUM 9.9 02/02/2017 1137   GFRNONAA 8 (L) 04/26/2020 1545   GFRNONAA 8 (L)  02/13/2020 1518   GFRAA 9 (L) 02/13/2020 1518    INR    Component Value Date/Time   INR 1.6 (H) 04/15/2020 2141     Intake/Output Summary (Last 24 hours) at 04/28/2020 6073 Last data filed at 04/28/2020 0600 Gross per 24 hour  Intake 337 ml  Output 550 ml  Net -213 ml    Assessment/Plan:  75 y.o. female is s/p R groin sartorius muscle flap  4 Days Post-Op   R foot well perfused based on doppler exam Continue dressing changes R foot and encouraged offloading R heel Bright red blood in vac cannister and fullness medial to groin incision; will discuss with Dr. Scot Dock; check CBC for now ESRD on HD per Nephrology   Dagoberto Ligas, PA-C [facility] [phone] 04/28/2020 8:08 AM  I have interviewed the patient and examined the patient. I agree with the findings by the PA.  The drainage in the [facility] looks serous.  We will continue to follow.  For dialysis today.  Gae Gallop, MD [phone]

## 2020-04-29 LAB — GLUCOSE, CAPILLARY
Glucose-Capillary: 138 mg/dL — ABNORMAL HIGH (ref 70–99)
Glucose-Capillary: 130 mg/dL — ABNORMAL HIGH (ref 70–99)
Glucose-Capillary: 75 mg/dL (ref 70–99)
Glucose-Capillary: 121 mg/dL — ABNORMAL HIGH (ref 70–99)

## 2020-04-29 LAB — RENAL FUNCTION PANEL
Albumin: 1.9 g/dL — ABNORMAL LOW (ref 3.5–5.0)
Anion gap: 15 (ref 5–15)
BUN: 53 mg/dL — ABNORMAL HIGH (ref 8–23)
CO2: 20 mmol/L — ABNORMAL LOW (ref 22–32)
Calcium: 7.4 mg/dL — ABNORMAL LOW (ref 8.9–10.3)
Chloride: 99 mmol/L (ref 98–111)
Creatinine, Ser: 6.16 mg/dL — ABNORMAL HIGH (ref 0.44–1.00)
GFR, Estimated: 7 mL/min — ABNORMAL LOW (ref >60)
Glucose, Bld: 83 mg/dL (ref 70–99)
Phosphorus: 5.6 mg/dL — ABNORMAL HIGH (ref 2.5–4.6)
Potassium: 4.7 mmol/L (ref 3.5–5.1)
Sodium: 134 mmol/L — ABNORMAL LOW (ref 135–145)

## 2020-04-29 NOTE — Progress Notes (Addendum)
  Progress Note    04/29/2020 8:09 AM 5 Days Post-Op  Subjective:  No complaints   Vitals:   04/29/20 0552 04/29/20 0646  BP: (!) 117/46   Pulse: 86   Resp: 16 12  Temp:    SpO2: 98%    Physical Exam: Lungs:  Non labored Incisions:  R groin incision with vac in place, good seal; fullness medial to groin incision, stable; R popliteal incision nearly healed Extremities:  R ATA brisk by doppler; R heel ulcer dry Neurologic: A&O  CBC    Component Value Date/Time   WBC 11.0 (H) 04/28/2020 1031   RBC 2.81 (L) 04/28/2020 1031   HGB 9.1 (L) 04/28/2020 1031   HGB 11.7 05/18/2017 1218   HGB 12.3 02/02/2017 1136   HCT 27.1 (L) 04/28/2020 1031   HCT 34.7 05/18/2017 1218   HCT 37.4 02/02/2017 1136   PLT 256 04/28/2020 1031   PLT 227 05/18/2017 1218   MCV 96.4 04/28/2020 1031   MCV 87 05/18/2017 1218   MCV 90.6 02/02/2017 1136   MCH 32.4 04/28/2020 1031   MCHC 33.6 04/28/2020 1031   RDW 18.2 (H) 04/28/2020 1031   RDW 16.0 (H) 05/18/2017 1218   RDW 15.7 (H) 02/02/2017 1136   LYMPHSABS 1.1 04/10/2020 2141   LYMPHSABS 1.8 02/02/2017 1136   MONOABS 0.7 04/11/2020 2141   MONOABS 0.8 02/02/2017 1136   EOSABS 0.1 05/01/2020 2141   EOSABS 0.3 02/02/2017 1136   BASOSABS 0.0 04/30/2020 2141   BASOSABS 0.1 02/02/2017 1136    BMET    Component Value Date/Time   NA 132 (L) 04/28/2020 1031   NA 143 05/18/2017 1218   NA 143 02/02/2017 1137   K 4.8 04/28/2020 1031   K 3.8 02/02/2017 1137   CL 97 (L) 04/28/2020 1031   CO2 18 (L) 04/28/2020 1031   CO2 24 02/02/2017 1137   GLUCOSE 113 (H) 04/28/2020 1031   GLUCOSE 74 02/02/2017 1137   BUN 52 (H) 04/28/2020 1031   BUN 76 (HH) 05/18/2017 1218   BUN 63.4 (H) 02/02/2017 1137   CREATININE 5.86 (H) 04/28/2020 1031   CREATININE 5.12 (H) 02/13/2020 1518   CREATININE 3.0 (HH) 02/02/2017 1137   CALCIUM 7.6 (L) 04/28/2020 1031   CALCIUM 9.9 02/02/2017 1137   GFRNONAA 7 (L) 04/28/2020 1031   GFRNONAA 8 (L) 02/13/2020 1518   GFRAA 9  (L) 02/13/2020 1518    INR    Component Value Date/Time   INR 1.6 (H) 05/06/2020 2141     Intake/Output Summary (Last 24 hours) at 04/29/2020 0809 Last data filed at 04/29/2020 0600 Gross per 24 hour  Intake -  Output 2182 ml  Net -2182 ml     Assessment/Plan:  75 y.o. female is s/p R groin sartorius muscle flap 5 Days Post-Op   R foot well perfused with brisk ATA by doppler R groin incision with fullness medially, but stable; continue to monitor; H&H stable Groin wound vac change tomorrow Encouraged offloading R heel while in bed   Dagoberto Ligas, PA-C [facility] [phone] 04/29/2020 8:09 AM  I have interviewed the patient and examined the patient. I agree with the findings by the PA.  We changed the VAC this morning in the right groin.  The muscle is viable with some serous drainage noted in the lateral wound.  VAC change again on Tuesday.  Gae Gallop, MD [phone]

## 2020-04-29 NOTE — Progress Notes (Signed)
[facility] Progress Note   Subjective:  Seen in room. Denies CP or dyspnea. Dialyzed yesterday with 2L net UF.  Objective Vitals:   04/29/20 0051 04/29/20 0552 04/29/20 0646 04/29/20 0847  BP: (!) 115/51 (!) 117/46  (!) 112/47  Pulse: 90 86  89  Resp: 17 16 12 17   Temp: 98 F (36.7 C)   98.2 F (36.8 C)  TempSrc: Oral Oral  Oral  SpO2: 100% 98%  100%  Weight:   64.7 kg    Physical Exam General: Chronically ill appearing woman, NAD. Heart: RRR; no murmur Lungs: CTAB; no rales Abdomen: soft, non-tender Extremities: No LE edema; wound vac to R groin, R toes bandaged. 1+ UE edema Dialysis Access: R forearm AVF + bruit  Additional Objective Labs: Basic Metabolic Panel: Recent Labs  Lab 04/28/2020 1947 04/26/20 1545 04/28/20 1031  NA 134* 134* 132*  K 4.3 4.3 4.8  CL 94* 96* 97*  CO2 25 24 18*  GLUCOSE 104* 115* 113*  BUN 59* 52* 52*  CREATININE 5.29* 5.51* 5.86*  CALCIUM 8.0* 7.3* 7.6*  PHOS 5.2* 4.5 5.3*   Liver Function Tests: Recent Labs  Lab 05/04/2020 2141 04/08/2020 1947 04/26/20 1545 04/28/20 1031  AST 13*  --   --   --   ALT <5  --   --   --   ALKPHOS 108  --   --   --   BILITOT 0.8  --   --   --   PROT 4.9*  --   --   --   ALBUMIN 2.2* 2.2* 1.9* 2.1*   CBC: Recent Labs  Lab 04/29/2020 2141 04/23/20 0415 04/27/2020 1947 04/25/20 1205 04/26/20 0830 04/28/20 0800 04/28/20 1031  WBC 12.6*   < > 16.8* 13.8* 12.2* 12.4* 11.0*  NEUTROABS 10.7*  --   --   --   --   --   --   HGB 10.0*   < > 8.4* 9.2* 9.1* 9.1* 9.1*  HCT 30.7*   < > 25.8* 28.8* 26.9* 28.6* 27.1*  MCV 95.9   < > 95.9 97.0 95.1 97.9 96.4  PLT 200   < > 218 127* 216 246 256   < > = values in this interval not displayed.   Medications: . ceFEPime (MAXIPIME) IV 1 g (04/28/20 1738)   . allopurinol  100 mg Oral BID  . atorvastatin  10 mg Oral Daily  . Chlorhexidine Gluconate Cloth  6 each Topical Q0600  . Chlorhexidine Gluconate Cloth  6 each Topical Q0600  . cinacalcet   30 mg Oral Q supper  . collagenase   Topical Daily  . [START ON 05/03/2020] darbepoetin (ARANESP) injection - DIALYSIS  60 mcg Intravenous Q Thu-HD  . docusate sodium  100 mg Oral BID  . feeding supplement (NEPRO CARB STEADY)   Oral BID  . heparin  5,000 Units Subcutaneous Q8H  . hydrALAZINE  75 mg Oral TID  . insulin aspart  0-5 Units Subcutaneous QHS  . insulin aspart  0-9 Units Subcutaneous TID WC  . insulin aspart  3 Units Subcutaneous TID WC  . isosorbide dinitrate  10 mg Oral TID  . multivitamin  1 tablet Oral QHS  . pantoprazole  40 mg Oral Daily  . sevelamer carbonate  1,600 mg Oral TID WC  . traZODone  25-50 mg Oral QHS    Dialysis Orders: [facility]on TTS BTD17.6 kgHD Bath 2K 2Ca 3 hours 45 minutes,no heparin. AccessRUA AVF Mircera 30 MCG every  2 weeks last on 01/13 units IV/HD Venofer100 mg loading needs 9 more doses   Assessment/Plan: 1. 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 S/p initial debridement1/17, then washout 1/18 by Dr. Donzetta Matters.Per [facility]. On Cefepime. 2. ESRD: Continue HD per usual TTS schedule - next HD 1/25. 3. Hypertension/volume- BP stable, meds on hold.Does not appear grossly volume overloaded. UF as tolerated.  4. Anemiaof CKD: Hgb 9.1, continue Aranesp q Thursday (next dose 1/27). HoldingIViron load with infection. 5. Metabolic bone disease -CCaand phos in goal.continue Renvela binder, Sensipar 30 daily 6. Nutrition - renal diet with fluid restrictions, vitamins, Nepro 7. History of CAD 8. History of systolic/diastolic heart failure   Veneta Penton, PA-C 04/29/2020, 9:08 AM  [employer]

## 2020-04-30 ENCOUNTER — Ambulatory Visit: Payer: Medicare Other | Admitting: Vascular Surgery

## 2020-04-30 LAB — GLUCOSE, CAPILLARY
Glucose-Capillary: 93 mg/dL (ref 70–99)
Glucose-Capillary: 88 mg/dL (ref 70–99)
Glucose-Capillary: 111 mg/dL — ABNORMAL HIGH (ref 70–99)
Glucose-Capillary: 120 mg/dL — ABNORMAL HIGH (ref 70–99)

## 2020-04-30 MED ORDER — CHLORHEXIDINE GLUCONATE CLOTH 2 % EX PADS: 6.0000 | MEDICATED_PAD | Freq: Every day | CUTANEOUS | Status: DC

## 2020-04-30 MED ORDER — HYDROMORPHONE HCL 1 MG/ML IJ SOLN
0.5000 mg | INTRAMUSCULAR | Status: DC | PRN
  Administered 2020-05-01 – 2020-05-02 (×2): 0.5 mg via INTRAVENOUS
  Filled 2020-04-30 (×2): qty 1

## 2020-04-30 NOTE — Care Management Important Message (Signed)
Important Message  Patient Details  Name: [name] MRN: [id number] Date of Birth: 04/27/1945   Medicare Important Message Given:  Yes     [name] 04/30/2020, 3:44 PM

## 2020-04-30 NOTE — Progress Notes (Addendum)
  Progress Note    04/30/2020 7:55 AM 6 Days Post-Op  Subjective:  No complaints   Vitals:   04/30/20 0353 04/30/20 0751  BP: (!) 123/50 (!) 112/48  Pulse: 89 90  Resp: 12 16  Temp: 98.7 F (37.1 C) 98 F (36.7 C)  SpO2: 100% 99%   Physical Exam: Lungs:  Non labored Incisions:  R groin with vac, good seal, soft Extremities:  Brisk ATA signal bilaterally Neurologic: A&O  CBC    Component Value Date/Time   WBC 11.0 (H) 04/28/2020 1031   RBC 2.81 (L) 04/28/2020 1031   HGB 9.1 (L) 04/28/2020 1031   HGB 11.7 05/18/2017 1218   HGB 12.3 02/02/2017 1136   HCT 27.1 (L) 04/28/2020 1031   HCT 34.7 05/18/2017 1218   HCT 37.4 02/02/2017 1136   PLT 256 04/28/2020 1031   PLT 227 05/18/2017 1218   MCV 96.4 04/28/2020 1031   MCV 87 05/18/2017 1218   MCV 90.6 02/02/2017 1136   MCH 32.4 04/28/2020 1031   MCHC 33.6 04/28/2020 1031   RDW 18.2 (H) 04/28/2020 1031   RDW 16.0 (H) 05/18/2017 1218   RDW 15.7 (H) 02/02/2017 1136   LYMPHSABS 1.1 04/24/2020 2141   LYMPHSABS 1.8 02/02/2017 1136   MONOABS 0.7 04/07/2020 2141   MONOABS 0.8 02/02/2017 1136   EOSABS 0.1 05/07/2020 2141   EOSABS 0.3 02/02/2017 1136   BASOSABS 0.0 04/23/2020 2141   BASOSABS 0.1 02/02/2017 1136    BMET    Component Value Date/Time   NA 134 (L) 04/29/2020 1426   NA 143 05/18/2017 1218   NA 143 02/02/2017 1137   K 4.7 04/29/2020 1426   K 3.8 02/02/2017 1137   CL 99 04/29/2020 1426   CO2 20 (L) 04/29/2020 1426   CO2 24 02/02/2017 1137   GLUCOSE 83 04/29/2020 1426   GLUCOSE 74 02/02/2017 1137   BUN 53 (H) 04/29/2020 1426   BUN 76 (HH) 05/18/2017 1218   BUN 63.4 (H) 02/02/2017 1137   CREATININE 6.16 (H) 04/29/2020 1426   CREATININE 5.12 (H) 02/13/2020 1518   CREATININE 3.0 (HH) 02/02/2017 1137   CALCIUM 7.4 (L) 04/29/2020 1426   CALCIUM 9.9 02/02/2017 1137   GFRNONAA 7 (L) 04/29/2020 1426   GFRNONAA 8 (L) 02/13/2020 1518   GFRAA 9 (L) 02/13/2020 1518    INR    Component Value Date/Time    INR 1.6 (H) 04/27/2020 2141     Intake/Output Summary (Last 24 hours) at 04/30/2020 0755 Last data filed at 04/30/2020 0700 Gross per 24 hour  Intake 355 ml  Output 100 ml  Net 255 ml     Assessment/Plan:  75 y.o. female is s/p R groin sartorius muscle flap  6 Days Post-Op   BLE well perfused with brisk ATA by doppler R groin wound vac change tomorrow; may involve [facility] team due to skin breakdown around incision PT/OT to evaluate for discharge planning   Dagoberto Ligas, PA-C [facility] [phone] 04/30/2020 7:55 AM   I have independently interviewed and examined patient and agree with PA assessment and plan above. Wound vac change tomorrow. Continue antibiotics  Aldean Pipe C. Donzetta Matters, MD [facility] [place] Office: [phone] Pager: [phone]

## 2020-04-30 NOTE — Evaluation (Signed)
Physical Therapy Evaluation Patient Details Name: [name] MRN: [id number] DOB: 08-20-1945 Today's Date: 04/30/2020   History of Present Illness  Patient is a 75 y/o female who presents from SNF due to infected groin wound now s/p I&D right groin wound 1/17, s/p washout of wound and Right sartorius muscle flap coverage and wound vac application 5/62/13. PMH includes Lft fem to below knee pop bypass with saphenous vein, toe amputations, breast ca, left shoulder dislocation, CKD, CAD, STEMI, OA.  Clinical Impression  Patient presents with generalized weakness, pain, impaired balance, decreased activity tolerance and impaired mobility s/p above. Pt is oriented x4 but having a hard time with details regarding PLOF/history/events at rehab. Pt reports she did not get much therapy at [facility] but was requiring assist to transfer to a w/c for dialysis. Today, pt requires Mod A for bed mobility, Min A of 2 for standing and for SPT to chair with assist for RW management, weight shifting and balance. Limited mainly by pain in groin with movement. BP soft but stable. Pt to keep offloading right heel in all positions. Would benefit from return to SNF to maximize independence and mobility prior to return home. Will follow acutely.    Follow Up Recommendations SNF;Supervision for mobility/OOB    Equipment Recommendations  None recommended by PT    Recommendations for Other Services       Precautions / Restrictions Precautions Precautions: Fall;Other (comment) Precaution Comments: keep right heel offloaded per pt, wound vac, soft BP Restrictions Weight Bearing Restrictions: No      Mobility  Bed Mobility Overal bed mobility: Needs Assistance Bed Mobility: Rolling;Sidelying to Sit Rolling: Mod assist Sidelying to sit: Mod assist;+2 for physical assistance;HOB elevated       General bed mobility comments: Assist to bring LLE to EOB, reach for rail and elevate trunk to get to EOB. +  dizziness which resolved.    Transfers Overall transfer level: Needs assistance Equipment used: Rolling walker (2 wheeled) Transfers: Sit to/from [facility] Sit to Stand: +2 physical assistance;From elevated surface;Min assist Stand pivot transfers: Min assist       General transfer comment: Assist to power to standing with cues for hand placement, technique and momentum, posterior bias initially. Able to take a few steps to get to chair with assist for RW management, difficulty progressing RLE due to pain, assist for weight shifting.  Ambulation/Gait                Stairs            Wheelchair Mobility    Modified Rankin (Stroke Patients Only)       Balance Overall balance assessment: Needs assistance Sitting-balance support: Feet supported;Bilateral upper extremity supported Sitting balance-Leahy Scale: Fair Sitting balance - Comments: close Min guard for safety; some sway noted sitting EOB.   Standing balance support: During functional activity Standing balance-Leahy Scale: Poor Standing balance comment: Requires UE support in standing and external support esp for dynamic tasks.                             Pertinent Vitals/Pain Pain Assessment: Faces Faces Pain Scale: Hurts even more Pain Location: right groin with movement Pain Descriptors / Indicators: Grimacing;Guarding;Sore Pain Intervention(s): Monitored during session;Repositioned;Limited activity within patient's tolerance    Home Living Family/patient expects to be discharged to:: Skilled nursing facility  Prior Function Level of Independence: Needs assistance   Gait / Transfers Assistance Needed: Reports it has been a long time since she has ambulated. Assist needed for transfers at SNF.  ADL's / Homemaking Assistance Needed: Assist with ADLs.  Comments: Has been at SNF [facility]) since last admission 1 month ago in December.      Hand Dominance   Dominant Hand: Right    Extremity/Trunk Assessment   Upper Extremity Assessment Upper Extremity Assessment: Defer to OT evaluation    Lower Extremity Assessment Lower Extremity Assessment: RLE deficits/detail;LLE deficits/detail RLE Deficits / Details: Limited movement due to pain; ankle AROM WFL, grossly ~3/5 throughout except hip flexion due to pain. RLE: Unable to fully assess due to pain RLE Sensation: WNL (per report) LLE Deficits / Details: Grossly ~3/5 throughout. LLE Sensation: WNL (per report)       Communication   Communication: No difficulties  Cognition Arousal/Alertness: Awake/alert Behavior During Therapy: WFL for tasks assessed/performed Overall Cognitive Status: No family/caregiver present to determine baseline cognitive functioning                                 General Comments: Oriented x4 however difficulty providing PLOF/history and details. Requires repetition at times and slow processing.      General Comments General comments (skin integrity, edema, etc.): BP soft but stable.    Exercises     Assessment/Plan    PT Assessment Patient needs continued PT services  PT Problem List Decreased strength;Decreased mobility;Pain;Decreased balance;Decreased activity tolerance;Decreased cognition;Decreased skin integrity;Decreased range of motion       PT Treatment Interventions Therapeutic exercise;Gait training;Balance training;Neuromuscular re-education;Functional mobility training;Therapeutic activities;Patient/family education;Cognitive remediation    PT Goals (Current goals can be found in the Care Plan section)  Acute Rehab PT Goals Patient Stated Goal: "do what i have to do to get better" PT Goal Formulation: With patient Time For Goal Achievement: 05/14/20 Potential to Achieve Goals: Good    Frequency Min 3X/week   Barriers to discharge        Co-evaluation               AM-PAC PT "6 Clicks"  Mobility  Outcome Measure Help needed turning from your back to your side while in a flat bed without using bedrails?: A Little Help needed moving from lying on your back to sitting on the side of a flat bed without using bedrails?: A Lot Help needed moving to and from a bed to a chair (including a wheelchair)?: A Little Help needed standing up from a chair using your arms (e.g., wheelchair or bedside chair)?: A Little Help needed to walk in hospital room?: A Lot Help needed climbing 3-5 steps with a railing? : Total 6 Click Score: 14    End of Session Equipment Utilized During Treatment: Gait belt Activity Tolerance: Patient limited by pain;Patient tolerated treatment well Patient left: in chair;with call bell/phone within reach;with chair alarm set Nurse Communication: Mobility status PT Visit Diagnosis: Pain;Unsteadiness on feet (R26.81);Muscle weakness (generalized) (M62.81);Other abnormalities of gait and mobility (R26.89) Pain - Right/Left: Right Pain - part of body:  (groin)    Time: 4270-6237 PT Time Calculation (min) (ACUTE ONLY): 29 min   Charges:   PT Evaluation $PT Eval Moderate Complexity: 1 Mod PT Treatments $Therapeutic Activity: 8-22 mins        Marisa Severin, PT, DPT Acute Rehabilitation Services Pager [phone] Office [phone]  [name] 04/30/2020, 3:24 PM

## 2020-04-30 NOTE — TOC Initial Note (Addendum)
Transition of Care [facility]) - Initial/Assessment Note    Patient Details  Name: [name] MRN: [id number] Date of Birth: Sep 23, 1945  Transition of Care [facility]) CM/SW Contact:    Vinie Sill, [place] Phone Number: 04/30/2020, 12:54 PM  Clinical Narrative:                  CSW spoke with patient's spouse,[name]. CSW introduced self and explained role. [name] states the patient was at [facility] before admitting to [facility] (she was d/c on 12/21 to [facility]). She was going to be discharged but was not before she was admitted to [facility]. CSW advised waiting on therapy recommendations. He expressed concerns that no one has spoken regarding how the patient is doing - CSW explained he probably did not receive a call because the patient is Ax4 -unless patient request to contact family. CSW advised will have RN to call to give update on progress. No other questions or concerns noted at this time.   CSW will continue to follow and assist with discharge planning.  Thurmond Butts, MSW, LCSW Clinical Social Worker   Expected Discharge Plan: Skilled Nursing Facility Barriers to Discharge: Continued Medical Work up   Patient Goals and CMS Choice        Expected Discharge Plan and Services Expected Discharge Plan: [place] In-house Referral: Clinical Social Work                                            Prior Living Arrangements/Services                       Activities of Daily Living      Permission Sought/Granted Permission sought to share information with : Family Supports                Emotional Assessment       Orientation: : Oriented to Self,Oriented to Place,Oriented to  Time,Oriented to Situation   Psych Involvement: No (comment)  Admission diagnosis:  Infection of prosthetic vascular graft, initial encounter ([place]) [T61.7XXA] Severe comorbid illness [R69] Status post surgery [Z98.890] Non-healing surgical wound of right groin  [T81.89XA] Patient Active Problem List   Diagnosis Date Noted  . Pressure injury of skin 04/25/2020  . Status post surgery 04/23/2020  . Non-healing surgical wound of right groin 04/23/2020  . Critical lower limb ischemia ([place]) 03/16/2020  . Left toe amputee ([place]) 02/13/2020  . Abnormal EKG 02/13/2020  . Diabetic retinopathy of both eyes associated with type 2 diabetes mellitus ([place]) 02/10/2020  . Headache, unspecified 08/02/2019  . Neuropathic pain   . Labile blood glucose   . Debility 04/07/2019  . 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 S/P femoral-popliteal bypass surgery 04/07/2019  . Chronic bilateral low back pain without sciatica   . Abscess of right foot   . Cellulitis of unspecified part of limb 01/27/2019  . Hyperlipidemia associated with type 2 diabetes mellitus ([place]) 01/09/2019  . Diabetes mellitus due to underlying condition with chronic kidney disease on chronic dialysis, with long-term current use of insulin ([place]) 01/09/2019  . Mild protein-calorie malnutrition ([place]) 09/06/2018  . Anemia in chronic kidney disease 09/03/2018  . Coagulation defect, unspecified ([place]) 09/03/2018  . Iron deficiency anemia, unspecified 09/03/2018  . Pain, unspecified 09/03/2018  . Secondary hyperparathyroidism of renal  origin ([place]) 09/03/2018  . End stage renal disease ([place]) 05/24/2018  . ESRD on hemodialysis ([place])   . Generalized weakness   . Mitral valve mass   . Coronary artery disease involving coronary bypass graft of native heart without angina pectoris   . Anemia 05/03/2017  . Diabetes mellitus type 2, insulin dependent ([place]) 05/08/2015  . Overweight (BMI 25.0-29.9) 02/14/2015  . Medication management 08/04/2014  . Diabetic neuropathy ([place]) 02/17/2014  . Gouty arthritis   . Vitamin D deficiency   . Malignant neoplasm of upper-inner quadrant of left breast in female, estrogen receptor positive ([place]) 02/14/2013  . PVD (peripheral vascular  disease) ([place]) 09/05/2009  . Essential hypertension 06/20/2009  . Congestive heart failure ([place]) 06/20/2009  . Asthma 06/20/2009   PCP:  Unk Pinto, MD Pharmacy:  No Pharmacies Listed    Social Determinants of Health (SDOH) Interventions    Readmission Risk Interventions Readmission Risk Prevention Plan 03/18/2020  Transportation Screening Complete  HRI or Home Care Consult Complete  SW Recovery Care/Counseling Consult Complete  Palliative Care Screening Not Applicable  Skilled Nursing Facility Complete  Some recent data might be hidden

## 2020-04-30 NOTE — Progress Notes (Signed)
[facility] Progress Note   Subjective:   Sleeping in room, awakens to voice. No complaints. Denies SOB, CP, dizziness, GI upset.   Objective Vitals:   04/29/20 2330 04/30/20 0353 04/30/20 0751 04/30/20 0854  BP: (!) 124/56 (!) 123/50 (!) 112/48 (!) 105/46  Pulse: 88 89 90   Resp: 18 12 16    Temp: 98 F (36.7 C) 98.7 F (37.1 C) 98 F (36.7 C)   TempSrc: Oral Oral Oral   SpO2: 100% 100% 99%   Weight:       Physical Exam General: Well developed female, alert and in NAD Heart: RRR, no murmurs, rubs or gallops Lungs: CTA bilaterally, no wheezing, rhonchi or rales Abdomen: Soft, non-tender, non-distended, +BS Extremities: Trace non-pitting edema R foot, toes bandaged. No edema LLE.  Dialysis Access:  RUE AVF + bruit  Additional Objective Labs: Basic Metabolic Panel: Recent Labs  Lab 04/26/20 1545 04/28/20 1031 04/29/20 1426  NA 134* 132* 134*  K 4.3 4.8 4.7  CL 96* 97* 99  CO2 24 18* 20*  GLUCOSE 115* 113* 83  BUN 52* 52* 53*  CREATININE 5.51* 5.86* 6.16*  CALCIUM 7.3* 7.6* 7.4*  PHOS 4.5 5.3* 5.6*   Liver Function Tests: Recent Labs  Lab 04/26/20 1545 04/28/20 1031 04/29/20 1426  ALBUMIN 1.9* 2.1* 1.9*   CBC: Recent Labs  Lab 04/29/2020 1947 04/25/20 1205 04/26/20 0830 04/28/20 0800 04/28/20 1031  WBC 16.8* 13.8* 12.2* 12.4* 11.0*  HGB 8.4* 9.2* 9.1* 9.1* 9.1*  HCT 25.8* 28.8* 26.9* 28.6* 27.1*  MCV 95.9 97.0 95.1 97.9 96.4  PLT 218 127* 216 246 256   Blood Culture    Component Value Date/Time   SDES BLOOD LEFT HAND 04/23/2020 0415   SPECREQUEST  04/23/2020 0415    BOTTLES DRAWN AEROBIC AND ANAEROBIC Blood Culture adequate volume   CULT  04/23/2020 0415    NO GROWTH 5 DAYS Performed at [facility], [place] [address] [place], [place] [address]    REPTSTATUS 04/28/2020 FINAL 04/23/2020 0415    CBG: Recent Labs  Lab 04/29/20 0615 04/29/20 1106 04/29/20 1612 04/29/20 2124 04/30/20 0616  GLUCAP 138* 130* 75 121* 93    Medications: . ceFEPime (MAXIPIME) IV 1 g (04/29/20 1530)   . allopurinol  100 mg Oral BID  . atorvastatin  10 mg Oral Daily  . Chlorhexidine Gluconate Cloth  6 each Topical Q0600  . Chlorhexidine Gluconate Cloth  6 each Topical Q0600  . cinacalcet  30 mg Oral Q supper  . collagenase   Topical Daily  . [START ON 05/03/2020] darbepoetin (ARANESP) injection - DIALYSIS  60 mcg Intravenous Q Thu-HD  . docusate sodium  100 mg Oral BID  . feeding supplement (NEPRO CARB STEADY)   Oral BID  . heparin  5,000 Units Subcutaneous Q8H  . hydrALAZINE  75 mg Oral TID  . insulin aspart  0-5 Units Subcutaneous QHS  . insulin aspart  0-9 Units Subcutaneous TID WC  . insulin aspart  3 Units Subcutaneous TID WC  . isosorbide dinitrate  10 mg Oral TID  . multivitamin  1 tablet Oral QHS  . pantoprazole  40 mg Oral Daily  . sevelamer carbonate  1,600 mg Oral TID WC  . traZODone  25-50 mg Oral QHS    Dialysis Orders: NWon TTS ZJQ73.4 kgHD Bath 2K 2Ca 3 hours 45 minutes,no heparin. AccessRUA AVF Mircera 30 MCG every 2 weeks last on 01/13 units IV/HD Venofer100 mg loading needs 9 more doses  Assessment/Plan: 1. 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 S/pinitial debridement1/17, thenwashout 1/18 by Dr. Donzetta Matters.Per [facility]. On Cefepime. 2. ESRD: Continue HD per usual TTS schedule - next HD 1/25. 3. Hypertension/volume- BPstable, meds on hold.Does not appear grossly volume overloaded. UF as tolerated.  4. Anemiaof CKD: Hgb 9.1,continue Aranespq Thursday (next dose1/27). HoldingIViron load with infection. 5. Metabolic bone disease -CCaand phos in goal.continue Renvela binder, Sensipar 30 daily 6. Nutrition - renal diet with fluid restrictions, vitamins, Nepro 7. History of CAD 8. History of systolic/diastolic heart failure   [name], PA-C 04/30/2020, 10:58 AM  [facility] Pager: [phone]

## 2020-05-01 LAB — GLUCOSE, CAPILLARY
Glucose-Capillary: 114 mg/dL — ABNORMAL HIGH (ref 70–99)
Glucose-Capillary: 93 mg/dL (ref 70–99)
Glucose-Capillary: 81 mg/dL (ref 70–99)
Glucose-Capillary: 81 mg/dL (ref 70–99)

## 2020-05-01 NOTE — Progress Notes (Signed)
  Progress Note    05/01/2020 1:59 PM 7 Days Post-Op  Subjective: Sleeping in her room following hemodialysis.  Did not awaken   Vitals:   05/01/20 1024 05/01/20 1114  BP: (!) 108/46 (!) 101/49  Pulse: 80 79  Resp: 17 18  Temp:  97.6 F (36.4 C)  SpO2: 100% 98%   Physical Exam: Right foot warm and well-perfused.  Right groin VAC dressing in place  CBC    Component Value Date/Time   WBC 11.0 (H) 04/28/2020 1031   RBC 2.81 (L) 04/28/2020 1031   HGB 9.1 (L) 04/28/2020 1031   HGB 11.7 05/18/2017 1218   HGB 12.3 02/02/2017 1136   HCT 27.1 (L) 04/28/2020 1031   HCT 34.7 05/18/2017 1218   HCT 37.4 02/02/2017 1136   PLT 256 04/28/2020 1031   PLT 227 05/18/2017 1218   MCV 96.4 04/28/2020 1031   MCV 87 05/18/2017 1218   MCV 90.6 02/02/2017 1136   MCH 32.4 04/28/2020 1031   MCHC 33.6 04/28/2020 1031   RDW 18.2 (H) 04/28/2020 1031   RDW 16.0 (H) 05/18/2017 1218   RDW 15.7 (H) 02/02/2017 1136   LYMPHSABS 1.1 04/27/2020 2141   LYMPHSABS 1.8 02/02/2017 1136   MONOABS 0.7 04/10/2020 2141   MONOABS 0.8 02/02/2017 1136   EOSABS 0.1 05/03/2020 2141   EOSABS 0.3 02/02/2017 1136   BASOSABS 0.0 04/13/2020 2141   BASOSABS 0.1 02/02/2017 1136    BMET    Component Value Date/Time   NA 134 (L) 04/29/2020 1426   NA 143 05/18/2017 1218   NA 143 02/02/2017 1137   K 4.7 04/29/2020 1426   K 3.8 02/02/2017 1137   CL 99 04/29/2020 1426   CO2 20 (L) 04/29/2020 1426   CO2 24 02/02/2017 1137   GLUCOSE 83 04/29/2020 1426   GLUCOSE 74 02/02/2017 1137   BUN 53 (H) 04/29/2020 1426   BUN 76 (HH) 05/18/2017 1218   BUN 63.4 (H) 02/02/2017 1137   CREATININE 6.16 (H) 04/29/2020 1426   CREATININE 5.12 (H) 02/13/2020 1518   CREATININE 3.0 (HH) 02/02/2017 1137   CALCIUM 7.4 (L) 04/29/2020 1426   CALCIUM 9.9 02/02/2017 1137   GFRNONAA 7 (L) 04/29/2020 1426   GFRNONAA 8 (L) 02/13/2020 1518   GFRAA 9 (L) 02/13/2020 1518    INR    Component Value Date/Time   INR 1.6 (H) 05/05/2020 2141      Intake/Output Summary (Last 24 hours) at 05/01/2020 1359 Last data filed at 05/01/2020 1020 Gross per 24 hour  Intake 300 ml  Output 100 ml  Net 200 ml     Assessment/Plan:  75 y.o. female plan VAC change tomorrow. VAC change tomorrow.     [name], MD [facility] [phone] 05/01/2020 1:59 PM Patient ID: [name], female   DOB: 11-02-1945, 75 y.o.   MRN: [id number]

## 2020-05-01 NOTE — Progress Notes (Signed)
[facility] Progress Note   Subjective:   Seen on HD. Per RN, pt received pain medication right before HD and BP is in the 32T-55D systolic. Pt reports the medication "knocks her out." Denies dizziness, CP, palpitations, abdominal pain, N/V.   Objective Vitals:   05/01/20 0815 05/01/20 0830 05/01/20 0845 05/01/20 0900  BP: (!) 90/46 (!) 89/42 (!) 84/45 (!) 102/47  Pulse: 74 77 74 75  Resp: (!) 8 (!) 22 15 15   Temp:      TempSrc:      SpO2: 96% 98% 98% 99%  Weight:       Physical Exam General: Well developed female, alert and in NAD Heart: RRR, no murmurs, rubs or gallops Lungs: CTA bilaterally, no wheezing, rhonchi or rales Abdomen: Soft, non-tender, non-distended, +BS Extremities: Trace non-pitting edema R foot, toes bandaged. No edema LLE.  Dialysis Access:  RUE AVF + bruit  Additional Objective Labs: Basic Metabolic Panel: Recent Labs  Lab 04/26/20 1545 04/28/20 1031 04/29/20 1426  NA 134* 132* 134*  K 4.3 4.8 4.7  CL 96* 97* 99  CO2 24 18* 20*  GLUCOSE 115* 113* 83  BUN 52* 52* 53*  CREATININE 5.51* 5.86* 6.16*  CALCIUM 7.3* 7.6* 7.4*  PHOS 4.5 5.3* 5.6*   Liver Function Tests: Recent Labs  Lab 04/26/20 1545 04/28/20 1031 04/29/20 1426  ALBUMIN 1.9* 2.1* 1.9*   CBC: Recent Labs  Lab 04/25/2020 1947 04/25/20 1205 04/26/20 0830 04/28/20 0800 04/28/20 1031  WBC 16.8* 13.8* 12.2* 12.4* 11.0*  HGB 8.4* 9.2* 9.1* 9.1* 9.1*  HCT 25.8* 28.8* 26.9* 28.6* 27.1*  MCV 95.9 97.0 95.1 97.9 96.4  PLT 218 127* 216 246 256   Blood Culture    Component Value Date/Time   SDES BLOOD LEFT HAND 04/23/2020 0415   SPECREQUEST  04/23/2020 0415    BOTTLES DRAWN AEROBIC AND ANAEROBIC Blood Culture adequate volume   CULT  04/23/2020 0415    NO GROWTH 5 DAYS Performed at [facility], [place] [address] [place], [place] [address]    REPTSTATUS 04/28/2020 FINAL 04/23/2020 0415    Cardiac Enzymes: No results for input(s): CKTOTAL, CKMB, CKMBINDEX,  TROPONINI in the last 168 hours. CBG: Recent Labs  Lab 04/30/20 0616 04/30/20 1141 04/30/20 1619 04/30/20 2133 05/01/20 0556  GLUCAP 93 88 111* 120* 114*   Iron Studies: No results for input(s): IRON, TIBC, TRANSFERRIN, FERRITIN in the last 72 hours. @lablastinr3 @ Studies/Results: No results found. Medications: . ceFEPime (MAXIPIME) IV Stopped (04/30/20 1724)   . allopurinol  100 mg Oral BID  . atorvastatin  10 mg Oral Daily  . Chlorhexidine Gluconate Cloth  6 each Topical Q0600  . cinacalcet  30 mg Oral Q supper  . collagenase   Topical Daily  . [START ON 05/03/2020] darbepoetin (ARANESP) injection - DIALYSIS  60 mcg Intravenous Q Thu-HD  . docusate sodium  100 mg Oral BID  . feeding supplement (NEPRO CARB STEADY)   Oral BID  . heparin  5,000 Units Subcutaneous Q8H  . hydrALAZINE  75 mg Oral TID  . insulin aspart  0-5 Units Subcutaneous QHS  . insulin aspart  0-9 Units Subcutaneous TID WC  . insulin aspart  3 Units Subcutaneous TID WC  . isosorbide dinitrate  10 mg Oral TID  . multivitamin  1 tablet Oral QHS  . pantoprazole  40 mg Oral Daily  . sevelamer carbonate  1,600 mg Oral TID WC  . traZODone  25-50 mg Oral QHS  Dialysis Orders:  NWon TTS YTK16.0 kgHD Bath 2K 2Ca 3 hours 45 minutes,no heparin. AccessRUA AVF Mircera 30 MCG every 2 weeks last on 01/13 units IV/HD Venofer100 mg loading needs 9 more doses  Assessment/Plan: 1. 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 S/pinitial debridement1/17, thenwashout 1/18 by Dr. Donzetta Matters.Per [facility].On Cefepime. May need lower dose of pain meds at least prior to dialysis to avoid hypotension and allow for volume removal. 2. ESRD: Continue HD per usual TTS schedule  3. Hypertension/volume- Antihypertensive meds on hold.Does not appear grossly volume overloaded. Unlikely to get much UF today with soft BP.  4. Anemiaof CKD: Hgb 9.1,continue Aranespq Thursday (next dose1/27).  HoldingIViron load with infection. 5. Metabolic bone disease -Corrected calcium within goal, phos slightly elevated.continue Renvela binder, Sensipar 30 daily 6. Nutrition - renal diet with fluid restrictions, vitamins, Nepro 7. History of CAD 8. History of systolic/diastolic heart failure   [name], PA-C 05/01/2020, 9:38 AM  [facility] Pager: [phone]

## 2020-05-01 NOTE — Evaluation (Signed)
Occupational Therapy Evaluation Patient Details Name: [name] MRN: [id number] DOB: 1945/06/02 Today's Date: 05/01/2020    History of Present Illness Patient is a 75 y/o female who presents from SNF due to infected groin wound now s/p I&D right groin wound 1/17, s/p washout of wound and Right sartorius muscle flap coverage and wound vac application 9/83/38. PMH includes Lft fem to below knee pop bypass with saphenous vein, toe amputations, breast ca, left shoulder dislocation, CKD, CAD, STEMI, OA.   Clinical Impression   Pt PTA: pt from SNF recovering from R groin wound. Pt currently,set-upA to maxA for ADL and maxA overall for bed mobility. Pt sitting EOB and did not advance today due to constant nausea at EOB. Pt had recently returned from dialysis. Pt with questionable cognitive deficits at this time. Pt would benefit from continued OT skilled services for ADL, mobility and safety in SNF setting. OT following acutely.    Follow Up Recommendations  SNF;Supervision/Assistance - 24 hour    Equipment Recommendations  3 in 1 bedside commode    Recommendations for Other Services       Precautions / Restrictions Precautions Precautions: Fall;Other (comment) Precaution Comments: keep right heel offloaded per pt, wound vac, soft BP Restrictions Weight Bearing Restrictions: No      Mobility Bed Mobility Overal bed mobility: Needs Assistance Bed Mobility: Rolling;Sidelying to Sit;Sit to Supine Rolling: Mod assist Sidelying to sit: Max assist   Sit to supine: Mod assist   General bed mobility comments: Assist to being LUE to rail and for moving BLEs with trunk to stiting EOB; pt requiring assist for BLEs for sitting to supine    Transfers                 General transfer comment: unable to test; pt nauseous sitting upright    Balance Overall balance assessment: Needs assistance Sitting-balance support: Feet supported;Bilateral upper extremity supported Sitting  balance-Leahy Scale: Fair Sitting balance - Comments: minguardA as pt leaning on elbows at times to conserve energy and to reduce pain.                                   ADL either performed or assessed with clinical judgement   ADL Overall ADL's : Needs assistance/impaired Eating/Feeding: Set up;Sitting   Grooming: Set up;Sitting   Upper Body Bathing: Minimal assistance;Sitting   Lower Body Bathing: Maximal assistance;Sitting/lateral leans;Bed level   Upper Body Dressing : Minimal assistance;Sitting   Lower Body Dressing: Maximal assistance;Sitting/lateral leans;Bed level   Toilet Transfer: Total assistance   Toileting- Clothing Manipulation and Hygiene: Maximal assistance;Sitting/lateral lean;Sit to/from stand       Functional mobility during ADLs: Total assistance General ADL Comments: Pt very limited by hypotension, decreased strength, increased confusion after awaking, decreased ability to care for self. Pt limited by pain in groin and RLE foot.     Vision Baseline Vision/History: No visual deficits Patient Visual Report: No change from baseline Vision Assessment?: No apparent visual deficits     Perception     Praxis      Pertinent Vitals/Pain Pain Assessment: Faces Faces Pain Scale: Hurts little more Pain Location: right groin with movement Pain Descriptors / Indicators: Grimacing;Guarding;Sore Pain Intervention(s): Monitored during session;Repositioned;Patient requesting pain meds-RN notified     Hand Dominance Right   Extremity/Trunk Assessment Upper Extremity Assessment Upper Extremity Assessment: Generalized weakness;RUE deficits/detail;LUE deficits/detail RUE Deficits / Details: poor grip to open  containers for meals LUE Deficits / Details: pt limited by decreased strength, increased pain, and decreased ability to care for self. Pt drowsy and reports stiffness in RLE. Pt was up in recliner for >3 hours and needed to change positions so  modA transfer from recliner to bed taking only a few steps with RW and putting minimal wt on RLE due to pain.   Lower Extremity Assessment Lower Extremity Assessment: Defer to PT evaluation;RLE deficits/detail RLE Deficits / Details: s/p side of groin incision with wound vac, RLE wrapped at foot   Cervical / Trunk Assessment Cervical / Trunk Assessment: Kyphotic   Communication     Cognition Arousal/Alertness: Lethargic Behavior During Therapy: WFL for tasks assessed/performed Overall Cognitive Status: No family/caregiver present to determine baseline cognitive functioning                                 General Comments: Oriented x4 however difficulty providing PLOF/history and details. Requires repetition at times and slow processing. Pt awoken from sleeping, but   General Comments  BP at EOB: 113/78    Exercises     Shoulder Instructions      Home Living Family/patient expects to be discharged to:: Skilled nursing facility                                 Additional Comments: husband is helpful and house is set up      Prior Functioning/Environment Level of Independence: Needs assistance  Gait / Transfers Assistance Needed: Reports it has been a long time since she has ambulated. Assist needed for transfers at SNF. ADL's / Homemaking Assistance Needed: Assist with ADLs.            OT Problem List: Decreased strength;Decreased activity tolerance;Impaired balance (sitting and/or standing);Decreased safety awareness;Decreased knowledge of use of DME or AE;Pain;Increased edema;Decreased knowledge of precautions      OT Treatment/Interventions: Self-care/ADL training;Therapeutic exercise;Energy conservation;DME and/or AE instruction;Therapeutic activities;Patient/family education;Balance training;Cognitive remediation/compensation    OT Goals(Current goals can be found in the care plan section) Acute Rehab OT Goals Patient Stated Goal: be able  to take care of myself OT Goal Formulation: With patient Time For Goal Achievement: 05/15/20 Potential to Achieve Goals: Good ADL Goals Pt Will Perform Grooming: with set-up;sitting Pt Will Perform Lower Body Dressing: with min guard assist;sitting/lateral leans;sit to/from stand Pt Will Transfer to Toilet: with min guard assist;stand pivot transfer;bedside commode Pt Will Perform Toileting - Clothing Manipulation and hygiene: with min guard assist;sitting/lateral leans;sit to/from stand Pt/caregiver will Perform Home Exercise Program: Increased strength;Both right and left upper extremity;With written HEP provided;With theraband Additional ADL Goal #1: Pt will participate in x8 mins of sitting EOB or OOB ADL tasks in order to increase independence.  OT Frequency: Min 2X/week   Barriers to D/C:            Co-evaluation              AM-PAC OT "6 Clicks" Daily Activity     Outcome Measure Help from another person eating meals?: A Little Help from another person taking care of personal grooming?: A Little Help from another person toileting, which includes using toliet, bedpan, or urinal?: A Lot Help from another person bathing (including washing, rinsing, drying)?: A Lot Help from another person to put on and taking off regular upper body clothing?: A Little Help  from another person to put on and taking off regular lower body clothing?: A Lot 6 Click Score: 15   End of Session Nurse Communication: Mobility status  Activity Tolerance: Patient limited by fatigue;Patient limited by pain Patient left: in bed;with call bell/phone within reach;with bed alarm set;with nursing/sitter in room  OT Visit Diagnosis: Unsteadiness on feet (R26.81);Muscle weakness (generalized) (M62.81);Pain;Other symptoms and signs involving cognitive function                Time: 1322-1350 OT Time Calculation (min): 28 min Charges:  OT General Charges $OT Visit: 1 Visit OT Evaluation $OT Eval Moderate  Complexity: 1 Mod OT Treatments $Self Care/Home Management : 8-22 mins  Jefferey Pica, OTR/L Acute Rehabilitation Services Pager: [phone] Office: [place] [facility] 05/01/2020, 5:18 PM

## 2020-05-01 NOTE — TOC Progression Note (Signed)
Transition of Care [facility]) - Progression Note    Patient Details  Name: [name] MRN: [id number] Date of Birth: 05-26-1945  Transition of Care [facility]) CM/SW [place], [place] Phone Number: 05/01/2020, 4:40 PM  Clinical Narrative:     CSW visit with patient at bedside. CSW informed patient of PT recommendation of returning to SNF/[facility] for continued PT. Patient states she was agreeable to recommendation and was agreeable to returning to [facility] is bed is available. Patient states no questions or concerns at this time.  CSW will continue to follow and assist with discharge planning. CSW contacted [facility]- no answer  CSW will continue to follow and assist with discharge planning.  Thurmond Butts, MSW, LCSW Clinical Social Worker   Expected Discharge Plan: Skilled Nursing Facility Barriers to Discharge: Continued Medical Work up  Expected Discharge Plan and Services Expected Discharge Plan: [place] In-house Referral: Clinical Social Work                                             Social Determinants of Health (SDOH) Interventions    Readmission Risk Interventions Readmission Risk Prevention Plan 03/18/2020  Transportation Screening Complete  HRI or Home Care Consult Complete  SW Recovery Care/Counseling Consult Complete  Palliative Care Screening Not Applicable  Skilled Nursing Facility Complete  Some recent data might be hidden

## 2020-05-01 NOTE — Progress Notes (Signed)
Hemodialysis- Treatment completed. No UF today due to persistent hypotension. Discussed with patient importance of trying to use po pain meds versus iv prior to hd as to avoid hypotension like today. PA aware of no UF. Report called to [facility]. Patient alert, currently denies pain. BP 108/46.

## 2020-05-02 ENCOUNTER — Encounter (HOSPITAL_BASED_OUTPATIENT_CLINIC_OR_DEPARTMENT_OTHER): Payer: Medicare Other | Admitting: Physician Assistant

## 2020-05-02 LAB — CBC
HCT: 26.9 % — ABNORMAL LOW (ref 36.0–46.0)
Hemoglobin: 8.9 g/dL — ABNORMAL LOW (ref 12.0–15.0)
MCH: 31.9 pg (ref 26.0–34.0)
MCHC: 33.1 g/dL (ref 30.0–36.0)
MCV: 96.4 fL (ref 80.0–100.0)
Platelets: 182 10*3/uL (ref 150–400)
RBC: 2.79 MIL/uL — ABNORMAL LOW (ref 3.87–5.11)
RDW: 19.4 % — ABNORMAL HIGH (ref 11.5–15.5)
WBC: 8.4 10*3/uL (ref 4.0–10.5)
nRBC: 0.5 % — ABNORMAL HIGH (ref 0.0–0.2)

## 2020-05-02 LAB — GLUCOSE, CAPILLARY
Glucose-Capillary: 84 mg/dL (ref 70–99)
Glucose-Capillary: 102 mg/dL — ABNORMAL HIGH (ref 70–99)
Glucose-Capillary: 104 mg/dL — ABNORMAL HIGH (ref 70–99)

## 2020-05-02 MED ORDER — CHLORHEXIDINE GLUCONATE CLOTH 2 % EX PADS
6.0000 | MEDICATED_PAD | Freq: Every day | CUTANEOUS | Status: DC
  Administered 2020-05-03 – 2020-05-11 (×9): 6 via TOPICAL

## 2020-05-02 NOTE — TOC Progression Note (Signed)
Transition of Care [facility]) - Progression Note    Patient Details  Name: [name] MRN: [id number] Date of Birth: [birth year]  Transition of Care [facility]) CM/SW [place], [place] Phone Number: 05/02/2020, 3:17 PM  Clinical Narrative:     [name] has confirmed bed offer  CSW will continue to follow and assist with discharge planning.   [name], MSW, LCSW Clinical Social Worker   Expected Discharge Plan: Skilled Nursing Facility Barriers to Discharge: Continued Medical Work up  Expected Discharge Plan and Services Expected Discharge Plan: [place] In-house Referral: Clinical Social Work                                             Social Determinants of Health (SDOH) Interventions    Readmission Risk Interventions Readmission Risk Prevention Plan 03/18/2020  Transportation Screening Complete  HRI or Home Care Consult Complete  SW Recovery Care/Counseling Consult Complete  Palliative Care Screening Not Applicable  Skilled Nursing Facility Complete  Some recent data might be hidden

## 2020-05-02 NOTE — Consult Note (Signed)
[place] Nurse Consult Note: Vascular team following for assessment and plan of care for right groin.  Requested to change Vac dressing. Dr Early at the bedside to assess wound appearance during first post-op dressing change. Pt medicated for pain prior to the procedure and tolerated with mod amt discomfort.  Wound type: Right groin with full thickness post-op wound; 10X4X3cm, 80% beefy red, 15% yellow, 10% black eschar to wound edge, visible muscle. Drainage (amount, consistency, odor) mod amt blood tinged drainage in the cannister. Periwound: Dressing procedure/placement/frequency: New cannister placed.  Applied barrier ring to wound edges to attempt to maintain a seal. One piece black foam to 128mm cont suction. WOC will plan to change dressing on Fri if patient is still in the hospital at that time.  Julien Girt MSN, RN, [place], [place], [place]

## 2020-05-02 NOTE — Progress Notes (Signed)
[name] Progress Note   Subjective:   Seen in room. Reports she is hungry and tired, otherwise no complaints. Denies SOB, CP, palpitations, dizziness, and nausea.   Objective Vitals:   05/01/20 2326 05/02/20 0408 05/02/20 0840 05/02/20 1100  BP: (!) 105/49 (!) 116/59 (!) 93/53 (!) 102/53  Pulse: 93 92 85 87  Resp: 16 18 18 18   Temp: 97.8 F (36.6 C) 98 F (36.7 C) (!) 97.5 F (36.4 C) 97.6 F (36.4 C)  TempSrc: Oral Oral Oral Oral  SpO2: 97% 100% 98% 100%  Weight:       Physical Exam  General:Well developed female, alert and in NAD Heart:RRR, no murmurs, rubs or gallops Lungs:CTA bilaterally, no wheezing, rhonchi or rales Abdomen:Soft, non-tender, non-distended, +BS Extremities:Trace non-pitting edema R foot, toes bandaged. No edema LLE. Dialysis Access:RUE AVF + bruit  Additional Objective Labs: Basic Metabolic Panel: Recent Labs  Lab 04/26/20 1545 04/28/20 1031 04/29/20 1426  NA 134* 132* 134*  K 4.3 4.8 4.7  CL 96* 97* 99  CO2 24 18* 20*  GLUCOSE 115* 113* 83  BUN 52* 52* 53*  CREATININE 5.51* 5.86* 6.16*  CALCIUM 7.3* 7.6* 7.4*  PHOS 4.5 5.3* 5.6*   Liver Function Tests: Recent Labs  Lab 04/26/20 1545 04/28/20 1031 04/29/20 1426  ALBUMIN 1.9* 2.1* 1.9*   CBC: Recent Labs  Lab 04/26/20 0830 04/28/20 0800 04/28/20 1031 05/02/20 0102  WBC 12.2* 12.4* 11.0* 8.4  HGB 9.1* 9.1* 9.1* 8.9*  HCT 26.9* 28.6* 27.1* 26.9*  MCV 95.1 97.9 96.4 96.4  PLT 216 246 256 182   Blood Culture    Component Value Date/Time   SDES BLOOD LEFT HAND 04/23/2020 0415   SPECREQUEST  04/23/2020 0415    BOTTLES DRAWN AEROBIC AND ANAEROBIC Blood Culture adequate volume   CULT  04/23/2020 0415    NO GROWTH 5 DAYS Performed at [facility], [place] [address], [place], [place] [address]    REPTSTATUS 04/28/2020 FINAL 04/23/2020 0415   CBG: Recent Labs  Lab 05/01/20 1113 05/01/20 1635 05/01/20 2150 05/02/20 0607 05/02/20 1058   GLUCAP 93 81 81 84 102*   Medications: . ceFEPime (MAXIPIME) IV 1 g (05/01/20 1548)   . allopurinol  100 mg Oral BID  . atorvastatin  10 mg Oral Daily  . Chlorhexidine Gluconate Cloth  6 each Topical Q0600  . cinacalcet  30 mg Oral Q supper  . collagenase   Topical Daily  . [START ON 05/03/2020] darbepoetin (ARANESP) injection - DIALYSIS  60 mcg Intravenous Q Thu-HD  . docusate sodium  100 mg Oral BID  . feeding supplement (NEPRO CARB STEADY)   Oral BID  . heparin  5,000 Units Subcutaneous Q8H  . hydrALAZINE  75 mg Oral TID  . insulin aspart  0-5 Units Subcutaneous QHS  . insulin aspart  0-9 Units Subcutaneous TID WC  . insulin aspart  3 Units Subcutaneous TID WC  . isosorbide dinitrate  10 mg Oral TID  . multivitamin  1 tablet Oral QHS  . pantoprazole  40 mg Oral Daily  . sevelamer carbonate  1,600 mg Oral TID WC  . traZODone  25-50 mg Oral QHS    Dialysis Orders:  NWon TTS XKG81.8 kgHD Bath 2K 2Ca 3 hours 45 minutes,no heparin. AccessRUA AVF Mircera 30 MCG every 2 weeks last on 01/13 units IV/HD Venofer100 mg loading needs 9 more doses  Assessment/Plan:  1. 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 S/pinitial  debridement1/17, thenwashout 1/18 by Dr. Donzetta Matters.Per [facility].On Cefepime. May need lower dose of pain meds at least prior to dialysis to avoid hypotension and allow for volume removal. 2. ESRD: Continue HD per usual TTS schedule  3. Hypertension/volume- Antihypertensive meds on hold.Does not appear grossly volume overloaded. Unable to obtain UF yesterday due to soft BP, will try small UF goal again tomorrow.  4. Anemiaof CKD: Hgb 8.9,continue Aranespq Thursday (next dose1/27). HoldingIViron load with infection. 5. Metabolic bone disease -Corrected calcium within goal, phos slightly elevated.continue Renvela binder, Sensipar 30 daily 6. Nutrition - renal diet with fluid restrictions, vitamins, Nepro 7. History of  CAD 8. History of systolic/diastolic heart failure    Pamela Paganini, PA-C 05/02/2020, 12:26 PM  [name] Pager: [phone]

## 2020-05-02 NOTE — NC FL2 (Signed)
[place] LEVEL OF CARE SCREENING TOOL     IDENTIFICATION  Patient Name: [name] Birthdate: 1945/09/24 Sex: female Admission Date (Current Location): 04/29/2020  [facility] and [place] Number:  Herbalist and Address:  [facility] [place], [place] [address], [place], [place] [address]      Provider Number: [id number]  Attending Physician Name and Address:  [place], [place] Name and Phone Number:       Current Level of Care: Hospital Recommended Level of Care: [place] Prior Approval Number:    Date Approved/Denied:   PASRR Number: [id number]  Discharge Plan: SNF    Current Diagnoses: Patient Active Problem List   Diagnosis Date Noted  . Pressure injury of skin 04/25/2020  . Status post surgery 04/23/2020  . Non-healing surgical wound of right groin 04/23/2020  . Critical lower limb ischemia ([place]) 03/16/2020  . Left toe amputee ([place]) 02/13/2020  . Abnormal EKG 02/13/2020  . Diabetic retinopathy of both eyes associated with type 2 diabetes mellitus ([place]) 02/10/2020  . Headache, unspecified 08/02/2019  . Neuropathic pain   . Labile blood glucose   . Debility 04/07/2019  . 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 S/P femoral-popliteal bypass surgery 04/07/2019  . Chronic bilateral low back pain without sciatica   . Abscess of right foot   . Cellulitis of unspecified part of limb 01/27/2019  . Hyperlipidemia associated with type 2 diabetes mellitus ([place]) 01/09/2019  . Diabetes mellitus due to underlying condition with chronic kidney disease on chronic dialysis, with long-term current use of insulin ([place]) 01/09/2019  . Mild protein-calorie malnutrition ([place]) 09/06/2018  . Anemia in chronic kidney disease 09/03/2018  . Coagulation defect, unspecified ([place]) 09/03/2018  . Iron deficiency anemia, unspecified 09/03/2018  . Pain, unspecified 09/03/2018  .  Secondary hyperparathyroidism of renal origin ([place]) 09/03/2018  . End stage renal disease ([place]) 05/24/2018  . ESRD on hemodialysis ([place])   . Generalized weakness   . Mitral valve mass   . Coronary artery disease involving coronary bypass graft of native heart without angina pectoris   . Anemia 05/03/2017  . Diabetes mellitus type 2, insulin dependent ([place]) 05/08/2015  . Overweight (BMI 25.0-29.9) 02/14/2015  . Medication management 08/04/2014  . Diabetic neuropathy ([place]) 02/17/2014  . Gouty arthritis   . Vitamin D deficiency   . Malignant neoplasm of upper-inner quadrant of left breast in female, estrogen receptor positive ([place]) 02/14/2013  . PVD (peripheral vascular disease) ([place]) 09/05/2009  . Essential hypertension 06/20/2009  . Congestive heart failure ([place]) 06/20/2009  . Asthma 06/20/2009    Orientation RESPIRATION BLADDER Height & Weight     Self,Time,Situation,Place  Normal Continent Weight: 141 lb 1.5 oz (64 kg) Height:     BEHAVIORAL SYMPTOMS/MOOD NEUROLOGICAL BOWEL NUTRITION STATUS      Continent Diet (please see discharge summary)  AMBULATORY STATUS COMMUNICATION OF NEEDS Skin   Limited Assist Verbally Surgical wounds (pressure injury buttocks RT/LF Mid Stage , closed incision RT Groin, wound incision diabetic ulcer RT foot, neagative pressure wound therapy RT Groin)                       Personal Care Assistance Level of Assistance  Bathing,Feeding,Dressing Bathing Assistance: Limited assistance Feeding assistance: Independent Dressing Assistance: Limited assistance     Functional Limitations Info  Sight,Hearing,Speech Sight Info: Adequate Hearing Info: Adequate Speech Info: Adequate  SPECIAL CARE FACTORS FREQUENCY  PT (By licensed PT),OT (By licensed OT)     PT Frequency: 5x per week OT Frequency: 5x per week            Contractures Contractures Info: Not present    Additional Factors Info  Code Status,Allergies Code Status Info:  Full Allergies Info: Atenolol,adhesive tape,contrast media diagnostic agents,lohexol,latex           Current Medications (05/02/2020):  This is the current hospital active medication list Current Facility-Administered Medications  Medication Dose Route Frequency Provider Last Rate Last Admin  . allopurinol (ZYLOPRIM) tablet 100 mg  100 mg Oral BID Waynetta Sandy, MD   100 mg at 05/02/20 8185  . alum & mag hydroxide-simeth (MAALOX/MYLANTA) 200-200-20 MG/5ML suspension 15-30 mL  15-30 mL Oral Q2H PRN Waynetta Sandy, MD      . aspirin EC tablet 81 mg  81 mg Oral Daily PRN Waynetta Sandy, MD      . atorvastatin (LIPITOR) tablet 10 mg  10 mg Oral Daily Waynetta Sandy, MD   10 mg at 05/02/20 6314  . bisacodyl (DULCOLAX) suppository 10 mg  10 mg Rectal PRN Waynetta Sandy, MD      . ceFEPIme (MAXIPIME) 1 g in sodium chloride 0.9 % 100 mL IVPB  1 g Intravenous Q24H Dagoberto Ligas, PA-C 200 mL/hr at 05/02/20 1421 1 g at 05/02/20 1421  . Chlorhexidine Gluconate Cloth 2 % PADS 6 each  6 each Topical Q0600 [place], [place], [place]   6 each at 05/01/20 0502  . [START ON 05/03/2020] Chlorhexidine Gluconate Cloth 2 % PADS 6 each  6 each Topical Q0600 Collins, Hervey Ard, PA-C      . cinacalcet (SENSIPAR) tablet 30 mg  30 mg Oral Q supper Waynetta Sandy, MD   30 mg at 05/01/20 1558  . collagenase (SANTYL) ointment   Topical Daily Waynetta Sandy, MD   Given at 05/02/20 [phone]  . cyclobenzaprine (FLEXERIL) tablet 5 mg  5 mg Oral TID PRN Waynetta Sandy, MD   5 mg at 04/21/2020 1007  . [START ON 05/03/2020] Darbepoetin Alfa (ARANESP) injection 60 mcg  60 mcg Intravenous Q Thu-HD Roney Jaffe, MD      . docusate sodium (COLACE) capsule 100 mg  100 mg Oral BID Waynetta Sandy, MD   100 mg at 05/02/20 6378  . feeding supplement (NEPRO CARB STEADY) liquid   Oral BID Waynetta Sandy, MD 0 mL/hr at 05/01/20 5885 [place] at 05/02/20 0911  . guaiFENesin-dextromethorphan (ROBITUSSIN DM) 100-10 MG/5ML syrup 15 mL  15 mL Oral Q4H PRN Waynetta Sandy, MD      . heparin injection 5,000 Units  5,000 Units Subcutaneous Q8H Waynetta Sandy, MD   5,000 Units at 05/02/20 1422  . hydrALAZINE (APRESOLINE) injection 5 mg  5 mg Intravenous Q20 Min PRN Waynetta Sandy, MD      . hydrALAZINE (APRESOLINE) tablet 75 mg  75 mg Oral TID Waynetta Sandy, MD   75 mg at 04/30/20 2152  . HYDROmorphone (DILAUDID) injection 0.5 mg  0.5 mg Intravenous Q2H PRN Dagoberto Ligas, PA-C   0.5 mg at 05/02/20 0754  . insulin aspart (novoLOG) injection 0-5 Units  0-5 Units Subcutaneous QHS Waynetta Sandy, MD      . insulin aspart (novoLOG) injection 0-9 Units  0-9 Units Subcutaneous TID WC Waynetta Sandy, MD   1 Units at 04/29/20 1158  . insulin aspart (  novoLOG) injection 3 Units  3 Units Subcutaneous TID WC Waynetta Sandy, MD   3 Units at 04/30/20 1223  . isosorbide dinitrate (ISORDIL) tablet 10 mg  10 mg Oral TID Waynetta Sandy, MD   10 mg at 05/01/20 1558  . menthol-cetylpyridinium (CEPACOL) lozenge 3 mg  1 lozenge Oral PRN Waynetta Sandy, MD      . multivitamin (RENA-VIT) tablet 1 tablet  1 tablet Oral QHS Waynetta Sandy, MD   1 tablet at 05/01/20 2213  . ondansetron (ZOFRAN) injection 4 mg  4 mg Intravenous Q6H PRN Waynetta Sandy, MD      . oxyCODONE-acetaminophen (PERCOCET/ROXICET) 5-325 MG per tablet 1-2 tablet  1-2 tablet Oral Q4H PRN Waynetta Sandy, MD   2 tablet at 04/30/20 1605  . pantoprazole (PROTONIX) EC tablet 40 mg  40 mg Oral Daily Waynetta Sandy, MD   40 mg at 05/02/20 7001  . phenol (CHLORASEPTIC) mouth spray 1 spray  1 spray Mouth/Throat PRN Waynetta Sandy, MD      . sevelamer carbonate (RENVELA) tablet 1,600 mg  1,600 mg Oral TID WC Waynetta Sandy, MD   1,600 mg at  05/02/20 0906  . traZODone (DESYREL) tablet 25-50 mg  25-50 mg Oral QHS Waynetta Sandy, MD   25 mg at 05/01/20 2214     Discharge Medications: Please see discharge summary for a list of discharge medications.  Relevant Imaging Results:  Relevant Lab Results:   Additional Information SSN [id number]    received pfizer vaccine 2/23 & 3/16   Dialysis Patient [facility] chair time 3:45pm  Vinie Sill, LCSWA

## 2020-05-02 NOTE — TOC Progression Note (Signed)
Transition of Care [facility]) - Progression Note    Patient Details  Name: [name] MRN: [id number] Date of Birth: 1945-06-19  Transition of Care [facility]) CM/SW [place], [place] Phone Number: 05/02/2020, 1:44 PM  Clinical Narrative:     CSW contacted [facility]/SNF to inquire about bed availability waiting on confirmation, if they can take her back- informed she will need wound vac.  Thurmond Butts, MSW, LCSW Clinical Social Worker   Expected Discharge Plan: Skilled Nursing Facility Barriers to Discharge: Continued Medical Work up  Expected Discharge Plan and Services Expected Discharge Plan: [place] In-house Referral: Clinical Social Work                                             Social Determinants of Health (SDOH) Interventions    Readmission Risk Interventions Readmission Risk Prevention Plan 03/18/2020  Transportation Screening Complete  HRI or Home Care Consult Complete  SW Recovery Care/Counseling Consult Complete  Palliative Care Screening Not Applicable  Skilled Nursing Facility Complete  Some recent data might be hidden

## 2020-05-02 NOTE — Progress Notes (Signed)
Physical Therapy Treatment Patient Details Name: [name] MRN: [id number] DOB: March 12, 1946 Today's Date: 05/02/2020    History of Present Illness Patient is a 75 y/o female who presents from SNF due to infected groin wound now s/p I&D right groin wound 1/17, s/p washout of wound and Right sartorius muscle flap coverage and wound vac application 07/19/22. PMH includes Lft fem to below knee pop bypass with saphenous vein, toe amputations, breast ca, left shoulder dislocation, CKD, CAD, STEMI, OA.    PT Comments    Pt supine in bed on arrival this session.  Pt required assistance to move into sitting ( mod assistance ) and for lateral scoot along edge of bed ( max assistance ).  Pt slow to process and continues to benefit from skilled rehab in a post acute setting.      Follow Up Recommendations  SNF;Supervision for mobility/OOB     Equipment Recommendations  None recommended by PT    Recommendations for Other Services       Precautions / Restrictions Precautions Precautions: Fall;Other (comment) Precaution Comments: keep right heel offloaded per pt, wound vac, soft BP Restrictions Weight Bearing Restrictions: No    Mobility  Bed Mobility Overal bed mobility: Needs Assistance Bed Mobility: Sit to Supine;Supine to Sit     Supine to sit: Mod assist Sit to supine: Mod assist   General bed mobility comments: Mod assistance to move to edge of bed to elevate trunk into sitting. Mod assistance to lift RLE back to bed.  Transfers Overall transfer level: Needs assistance Equipment used: Rolling walker (2 wheeled) Transfers: Lateral/Scoot Transfers (along edge of bed to the L.)          Lateral/Scoot Transfers: Max assist General transfer comment: use of bed pad to scoot along edge of bed to the L.  Limited participation on patient's behalf.  Ambulation/Gait                 Stairs             Wheelchair Mobility    Modified Rankin (Stroke Patients  Only)       Balance Overall balance assessment: Needs assistance Sitting-balance support: Feet supported;Bilateral upper extremity supported Sitting balance-Leahy Scale: Fair       Standing balance-Leahy Scale:  (did not assess.)                              Cognition Arousal/Alertness: Lethargic Behavior During Therapy: Flat affect Overall Cognitive Status: No family/caregiver present to determine baseline cognitive functioning Area of Impairment: Following commands;Safety/judgement;Memory;Problem solving                     Memory: Decreased recall of precautions Following Commands: Follows one step commands with increased time Safety/Judgement: Decreased awareness of safety;Decreased awareness of deficits   Problem Solving: Requires verbal cues;Slow processing General Comments: Pt very slow to process commands.  reports she is not suppose to bear weight on R foot at all, last vascular note mentioned just to offload R heel.      Exercises General Exercises - Lower Extremity Ankle Circles/Pumps: AROM;Both;10 reps;Supine Quad Sets: AROM;Both;10 reps;Supine Heel Slides: AAROM;Both;10 reps;Supine    General Comments        Pertinent Vitals/Pain Pain Assessment: Faces Faces Pain Scale: Hurts little more Pain Location: right groin with movement Pain Descriptors / Indicators: Grimacing;Guarding;Sore Pain Intervention(s): Monitored during session;Repositioned    Home Living  Prior Function            PT Goals (current goals can now be found in the care plan section) Acute Rehab PT Goals Patient Stated Goal: be able to take care of myself Potential to Achieve Goals: Good Progress towards PT goals: Progressing toward goals    Frequency    Min 3X/week      PT Plan Current plan remains appropriate    Co-evaluation              AM-PAC PT "6 Clicks" Mobility   Outcome Measure  Help needed turning from  your back to your side while in a flat bed without using bedrails?: A Little Help needed moving from lying on your back to sitting on the side of a flat bed without using bedrails?: A Lot Help needed moving to and from a bed to a chair (including a wheelchair)?: A Little Help needed standing up from a chair using your arms (e.g., wheelchair or bedside chair)?: Total Help needed to walk in hospital room?: Total Help needed climbing 3-5 steps with a railing? : Total 6 Click Score: 11    End of Session Equipment Utilized During Treatment: Gait belt Activity Tolerance: Patient limited by pain;Patient tolerated treatment well Patient left: in chair;with call bell/phone within reach;with chair alarm set Nurse Communication: Mobility status PT Visit Diagnosis: Pain;Unsteadiness on feet (R26.81);Muscle weakness (generalized) (M62.81);Other abnormalities of gait and mobility (R26.89) Pain - Right/Left: Right Pain - part of body:  (groin)     Time: 2355-7322 PT Time Calculation (min) (ACUTE ONLY): 22 min  Charges:  $Therapeutic Activity: 8-22 mins                     Erasmo Leventhal , PTA Acute Rehabilitation Services Pager [phone] Office [phone]     Loran Auguste Eli Hose 05/02/2020, 3:39 PM

## 2020-05-02 NOTE — Progress Notes (Addendum)
Progress Note    05/02/2020 8:45 AM 8 Days Post-Op  Subjective:  No complaints   Vitals:   05/02/20 0408 05/02/20 0840  BP: (!) 116/59 (!) 93/53  Pulse: 92 85  Resp: 18 18  Temp: 98 F (36.7 C) (!) 97.5 F (36.4 C)  SpO2: 100% 98%   Physical Exam: Cardiac:  regular Lungs: non labored Incisions:  Right groin wound well appearing with pink granulation tissue in wound bed with viable muscle. Wound VAC with Serosanguinous drainage. Wound VAC changed  Extremities:  Lower extremities well perfused and warm. Doppler right AT signal. Right heal with minimal dry callus. Right lateral foot wound clean 4 cm x 2 cm   Neurologic: alert and oriented  CBC    Component Value Date/Time   WBC 8.4 05/02/2020 0102   RBC 2.79 (L) 05/02/2020 0102   HGB 8.9 (L) 05/02/2020 0102   HGB 11.7 05/18/2017 1218   HGB 12.3 02/02/2017 1136   HCT 26.9 (L) 05/02/2020 0102   HCT 34.7 05/18/2017 1218   HCT 37.4 02/02/2017 1136   PLT 182 05/02/2020 0102   PLT 227 05/18/2017 1218   MCV 96.4 05/02/2020 0102   MCV 87 05/18/2017 1218   MCV 90.6 02/02/2017 1136   MCH 31.9 05/02/2020 0102   MCHC 33.1 05/02/2020 0102   RDW 19.4 (H) 05/02/2020 0102   RDW 16.0 (H) 05/18/2017 1218   RDW 15.7 (H) 02/02/2017 1136   LYMPHSABS 1.1 04/21/2020 2141   LYMPHSABS 1.8 02/02/2017 1136   MONOABS 0.7 04/27/2020 2141   MONOABS 0.8 02/02/2017 1136   EOSABS 0.1 05/05/2020 2141   EOSABS 0.3 02/02/2017 1136   BASOSABS 0.0 04/30/2020 2141   BASOSABS 0.1 02/02/2017 1136    BMET    Component Value Date/Time   NA 134 (L) 04/29/2020 1426   NA 143 05/18/2017 1218   NA 143 02/02/2017 1137   K 4.7 04/29/2020 1426   K 3.8 02/02/2017 1137   CL 99 04/29/2020 1426   CO2 20 (L) 04/29/2020 1426   CO2 24 02/02/2017 1137   GLUCOSE 83 04/29/2020 1426   GLUCOSE 74 02/02/2017 1137   BUN 53 (H) 04/29/2020 1426   BUN 76 (HH) 05/18/2017 1218   BUN 63.4 (H) 02/02/2017 1137   CREATININE 6.16 (H) 04/29/2020 1426   CREATININE  5.12 (H) 02/13/2020 1518   CREATININE 3.0 (HH) 02/02/2017 1137   CALCIUM 7.4 (L) 04/29/2020 1426   CALCIUM 9.9 02/02/2017 1137   GFRNONAA 7 (L) 04/29/2020 1426   GFRNONAA 8 (L) 02/13/2020 1518   GFRAA 9 (L) 02/13/2020 1518    INR    Component Value Date/Time   INR 1.6 (H) 04/07/2020 2141     Intake/Output Summary (Last 24 hours) at 05/02/2020 0845 Last data filed at 05/02/2020 0800 Gross per 24 hour  Intake -  Output 50 ml  Net -50 ml     Assessment/Plan:  75 y.o. female is s/p R groin sartorius muscle flap  8 Days Post-Op. Doing well post op. BLE well perfused and warm with brisk AT doppler signal in right foot. Right groin with healthy appearing granulation tissue and viable muscle flap. Right foot wound stable. Wound VAC reapplied per [place]. Will be changed again on Friday. Hemodynamically stable. VSS. Afebrile. Mobilize as tolerated. PT/ OT recommending SNF. TOC assisting with SNF placement  DVT prophylaxis:  Sq heparin   [facility]val [facility] [phone] 05/02/2020 8:45 AM   I have examined the patient, reviewed  and agree with above.  Muscle flap viable.  Some fat necrosis laterally.  Foot wound continues to improve.  Curt Jews, MD 05/02/2020 11:32 AM

## 2020-05-03 ENCOUNTER — Inpatient Hospital Stay (HOSPITAL_COMMUNITY): Payer: Medicare Other

## 2020-05-03 DIAGNOSIS — R404 Transient alteration of awareness: Secondary | ICD-10-CM

## 2020-05-03 LAB — BLOOD GAS, VENOUS
Acid-base deficit: 0.6 mmol/L (ref 0.0–2.0)
Bicarbonate: 23.4 mmol/L (ref 20.0–28.0)
Drawn by: 1444
FIO2: 21
O2 Saturation: 80.3 %
Patient temperature: 37
pCO2, Ven: 37.6 mmHg — ABNORMAL LOW (ref 44.0–60.0)
pH, Ven: 7.411 (ref 7.250–7.430)
pO2, Ven: 46.6 mmHg — ABNORMAL HIGH (ref 32.0–45.0)

## 2020-05-03 LAB — RENAL FUNCTION PANEL
Albumin: 2.1 g/dL — ABNORMAL LOW (ref 3.5–5.0)
Anion gap: 17 — ABNORMAL HIGH (ref 5–15)
BUN: 64 mg/dL — ABNORMAL HIGH (ref 8–23)
CO2: 20 mmol/L — ABNORMAL LOW (ref 22–32)
Calcium: 7.9 mg/dL — ABNORMAL LOW (ref 8.9–10.3)
Chloride: 100 mmol/L (ref 98–111)
Creatinine, Ser: 7.12 mg/dL — ABNORMAL HIGH (ref 0.44–1.00)
GFR, Estimated: 6 mL/min — ABNORMAL LOW (ref >60)
Glucose, Bld: 84 mg/dL (ref 70–99)
Phosphorus: 6.2 mg/dL — ABNORMAL HIGH (ref 2.5–4.6)
Potassium: 4.9 mmol/L (ref 3.5–5.1)
Sodium: 137 mmol/L (ref 135–145)

## 2020-05-03 LAB — GLUCOSE, CAPILLARY
Glucose-Capillary: 85 mg/dL (ref 70–99)
Glucose-Capillary: 78 mg/dL (ref 70–99)

## 2020-05-03 LAB — CBC
HCT: 28.1 % — ABNORMAL LOW (ref 36.0–46.0)
Hemoglobin: 9.2 g/dL — ABNORMAL LOW (ref 12.0–15.0)
MCH: 31.6 pg (ref 26.0–34.0)
MCHC: 32.7 g/dL (ref 30.0–36.0)
MCV: 96.6 fL (ref 80.0–100.0)
Platelets: 184 10*3/uL (ref 150–400)
RBC: 2.91 MIL/uL — ABNORMAL LOW (ref 3.87–5.11)
RDW: 19.9 % — ABNORMAL HIGH (ref 11.5–15.5)
WBC: 8.1 10*3/uL (ref 4.0–10.5)
nRBC: 0.4 % — ABNORMAL HIGH (ref 0.0–0.2)

## 2020-05-03 LAB — LACTIC ACID, PLASMA
Lactic Acid, Venous: 0.8 mmol/L (ref 0.5–1.9)
Lactic Acid, Venous: 0.9 mmol/L (ref 0.5–1.9)

## 2020-05-03 LAB — T4, FREE: Free T4: 1.32 ng/dL — ABNORMAL HIGH (ref 0.61–1.12)

## 2020-05-03 LAB — SARS CORONAVIRUS 2 BY RT PCR (HOSPITAL ORDER, PERFORMED IN ~~LOC~~ HOSPITAL LAB): SARS Coronavirus 2: NEGATIVE

## 2020-05-03 LAB — TSH: TSH: 6.876 u[IU]/mL — ABNORMAL HIGH (ref 0.350–4.500)

## 2020-05-03 MED ORDER — DARBEPOETIN ALFA 60 MCG/0.3ML IJ SOSY
PREFILLED_SYRINGE | INTRAMUSCULAR | Status: AC
  Filled 2020-05-03: qty 0.3

## 2020-05-03 MED ORDER — HYDRALAZINE HCL 50 MG PO TABS
50.0000 mg | ORAL_TABLET | Freq: Three times a day (TID) | ORAL | Status: DC
  Administered 2020-05-03 – 2020-05-08 (×6): 50 mg via ORAL
  Filled 2020-05-03 (×8): qty 1

## 2020-05-03 NOTE — Progress Notes (Signed)
Chest CT completed with impression indicating "subtotal collapse of the right lower lobe". Head CT impression negative for acute changes. Pt resting comfortably in bed with no complaints and no overt distress (RR regular 15-20 and SpO2 95-100% on RA). Will implement aggressive pulmonary toileting and convey to Day Team. Will continue to monitor and notify hospitalist on-call if pt's status deteriorates.

## 2020-05-03 NOTE — Progress Notes (Signed)
Patient's son called and informed this nurse that the patient's husband tested positive for Covid 24 and had visited the patient on 1/26. Matt, PA was notified and stated will order a test for the patient.   -Donnelly Angelica, RN

## 2020-05-03 NOTE — Progress Notes (Signed)
[facility] Progress Note   Subjective:   Seen in room, sleeping. No complaints.   Objective Vitals:   05/03/20 1200 05/03/20 1205 05/03/20 1210 05/03/20 1215  BP: 99/67   (!) 112/47  Pulse: 87     Resp: (!) 22 17 19 14   Temp:      TempSrc:      SpO2:      Weight:       Physical Exam General:Well developed female in NAD Heart:RRR, no murmurs, rubs or gallops Lungs:CTA bilaterally, no wheezing, rhonchi or rales Abdomen:Soft,  non-distended, +BS Extremities:Trace non-pitting edema R foot, toes bandaged. No edema LLE. Dialysis Access:RUE AVF + bruit  Additional Objective Labs: Basic Metabolic Panel: Recent Labs  Lab 04/28/20 1031 04/29/20 1426 05/03/20 1025  NA 132* 134* 137  K 4.8 4.7 4.9  CL 97* 99 100  CO2 18* 20* 20*  GLUCOSE 113* 83 84  BUN 52* 53* 64*  CREATININE 5.86* 6.16* 7.12*  CALCIUM 7.6* 7.4* 7.9*  PHOS 5.3* 5.6* 6.2*   Liver Function Tests: Recent Labs  Lab 04/28/20 1031 04/29/20 1426 05/03/20 1025  ALBUMIN 2.1* 1.9* 2.1*   No results for input(s): LIPASE, AMYLASE in the last 168 hours. CBC: Recent Labs  Lab 04/28/20 0800 04/28/20 1031 05/02/20 0102 05/03/20 0242  WBC 12.4* 11.0* 8.4 8.1  HGB 9.1* 9.1* 8.9* 9.2*  HCT 28.6* 27.1* 26.9* 28.1*  MCV 97.9 96.4 96.4 96.6  PLT 246 256 182 184   Blood Culture    Component Value Date/Time   SDES BLOOD LEFT HAND 04/23/2020 0415   SPECREQUEST  04/23/2020 0415    BOTTLES DRAWN AEROBIC AND ANAEROBIC Blood Culture adequate volume   CULT  04/23/2020 0415    NO GROWTH 5 DAYS Performed at [facility], [place] [address], [place], [place] [address]    REPTSTATUS 04/28/2020 FINAL 04/23/2020 0415    Cardiac Enzymes: No results for input(s): CKTOTAL, CKMB, CKMBINDEX, TROPONINI in the last 168 hours. CBG: Recent Labs  Lab 05/01/20 2150 05/02/20 0607 05/02/20 1058 05/02/20 1600 05/03/20 0620  GLUCAP 81 84 102* 104* 85   Iron Studies: No results for input(s):  IRON, TIBC, TRANSFERRIN, FERRITIN in the last 72 hours. @lablastinr3 @ Studies/Results: No results found. Medications: . ceFEPime (MAXIPIME) IV 1 g (05/02/20 1421)   . allopurinol  100 mg Oral BID  . atorvastatin  10 mg Oral Daily  . Chlorhexidine Gluconate Cloth  6 each Topical Q0600  . Chlorhexidine Gluconate Cloth  6 each Topical Q0600  . cinacalcet  30 mg Oral Q supper  . collagenase   Topical Daily  . Darbepoetin Alfa      . darbepoetin (ARANESP) injection - DIALYSIS  60 mcg Intravenous Q Thu-HD  . docusate sodium  100 mg Oral BID  . feeding supplement (NEPRO CARB STEADY)   Oral BID  . heparin  5,000 Units Subcutaneous Q8H  . hydrALAZINE  75 mg Oral TID  . insulin aspart  0-5 Units Subcutaneous QHS  . insulin aspart  0-9 Units Subcutaneous TID WC  . insulin aspart  3 Units Subcutaneous TID WC  . isosorbide dinitrate  10 mg Oral TID  . multivitamin  1 tablet Oral QHS  . pantoprazole  40 mg Oral Daily  . sevelamer carbonate  1,600 mg Oral TID WC  . traZODone  25-50 mg Oral QHS    Dialysis Orders: NWon TTS GLO75.6 kgHD Bath 2K 2Ca 3 hours 45 minutes,no heparin. AccessRUA AVF Mircera 30 MCG every  2 weeks last on 01/13 units IV/HD Venofer100 mg loading needs 9 more doses  Assessment/Plan:  1. 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 S/pinitial debridement1/17, thenwashout 1/18 by Dr. Donzetta Matters.Per [facility].On Cefepime.May need lower dose of pain meds at least prior to dialysis to avoid hypotension. BP better today.  2. ESRD: Continue HD per usual TTS schedule  3. Hypertension/volume-Antihypertensivemeds on hold.Does not appear grossly volume overloaded. Unable to obtain UF last HD due to soft BP, trying small UF goal today 4. Anemiaof CKD: Hgb 9.2- improving,continue Aranespq Thursday (next dose1/27). HoldingIViron load with infection. 5. Metabolic bone disease -Corrected calcium within goal, phos slightly  elevated.continue Renvela binder, Sensipar 30 daily 6. Nutrition - renal diet with fluid restrictions, vitamins, Nepro 7. History of CAD 8. History of systolic/diastolic heart failure   [name], PA-C 05/03/2020, 12:26 PM  [facility] Pager: [phone]

## 2020-05-03 NOTE — Progress Notes (Addendum)
Progress Note    05/03/2020 9:53 AM 9 Days Post-Op  Subjective:  No complaints.    Vitals:   05/03/20 0410 05/03/20 0813  BP: (!) 119/45 (!) 116/54  Pulse: 88 86  Resp: 19 16  Temp: 97.9 F (36.6 C) 97.7 F (36.5 C)  SpO2: 99% 100%    Physical Exam: General appearance: Awake, in no apparent distress. Pleasant, flat affect Cardiac: Heart rate and rhythm are regular Respirations: Nonlabored Incisions: Right groin vac dressing in place with good seal; some peri-wound brawny edema. Minimal thin, bloody output.  Right lower leg incision is well approximated without signs of infection Extremities: Right foot is warm with intact sensation and motor function. Dry dressing in place over right foot wound. RUE AVF with good thrill.  CBC    Component Value Date/Time   WBC 8.1 05/03/2020 0242   RBC 2.91 (L) 05/03/2020 0242   HGB 9.2 (L) 05/03/2020 0242   HGB 11.7 05/18/2017 1218   HGB 12.3 02/02/2017 1136   HCT 28.1 (L) 05/03/2020 0242   HCT 34.7 05/18/2017 1218   HCT 37.4 02/02/2017 1136   PLT 184 05/03/2020 0242   PLT 227 05/18/2017 1218   MCV 96.6 05/03/2020 0242   MCV 87 05/18/2017 1218   MCV 90.6 02/02/2017 1136   MCH 31.6 05/03/2020 0242   MCHC 32.7 05/03/2020 0242   RDW 19.9 (H) 05/03/2020 0242   RDW 16.0 (H) 05/18/2017 1218   RDW 15.7 (H) 02/02/2017 1136   LYMPHSABS 1.1 04/18/2020 2141   LYMPHSABS 1.8 02/02/2017 1136   MONOABS 0.7 04/17/2020 2141   MONOABS 0.8 02/02/2017 1136   EOSABS 0.1 04/24/2020 2141   EOSABS 0.3 02/02/2017 1136   BASOSABS 0.0 04/23/2020 2141   BASOSABS 0.1 02/02/2017 1136    BMET    Component Value Date/Time   NA 134 (L) 04/29/2020 1426   NA 143 05/18/2017 1218   NA 143 02/02/2017 1137   K 4.7 04/29/2020 1426   K 3.8 02/02/2017 1137   CL 99 04/29/2020 1426   CO2 20 (L) 04/29/2020 1426   CO2 24 02/02/2017 1137   GLUCOSE 83 04/29/2020 1426   GLUCOSE 74 02/02/2017 1137   BUN 53 (H) 04/29/2020 1426   BUN 76 (HH) 05/18/2017 1218    BUN 63.4 (H) 02/02/2017 1137   CREATININE 6.16 (H) 04/29/2020 1426   CREATININE 5.12 (H) 02/13/2020 1518   CREATININE 3.0 (HH) 02/02/2017 1137   CALCIUM 7.4 (L) 04/29/2020 1426   CALCIUM 9.9 02/02/2017 1137   GFRNONAA 7 (L) 04/29/2020 1426   GFRNONAA 8 (L) 02/13/2020 1518   GFRAA 9 (L) 02/13/2020 1518     Intake/Output Summary (Last 24 hours) at 05/03/2020 0953 Last data filed at 05/02/2020 2000 Gross per 24 hour  Intake --  Output 50 ml  Net -50 ml    HOSPITAL MEDICATIONS Scheduled Meds: . allopurinol  100 mg Oral BID  . atorvastatin  10 mg Oral Daily  . Chlorhexidine Gluconate Cloth  6 each Topical Q0600  . Chlorhexidine Gluconate Cloth  6 each Topical Q0600  . cinacalcet  30 mg Oral Q supper  . collagenase   Topical Daily  . darbepoetin (ARANESP) injection - DIALYSIS  60 mcg Intravenous Q Thu-HD  . docusate sodium  100 mg Oral BID  . feeding supplement (NEPRO CARB STEADY)   Oral BID  . heparin  5,000 Units Subcutaneous Q8H  . hydrALAZINE  75 mg Oral TID  . insulin aspart  0-5 Units Subcutaneous QHS  .  insulin aspart  0-9 Units Subcutaneous TID WC  . insulin aspart  3 Units Subcutaneous TID WC  . isosorbide dinitrate  10 mg Oral TID  . multivitamin  1 tablet Oral QHS  . pantoprazole  40 mg Oral Daily  . sevelamer carbonate  1,600 mg Oral TID WC  . traZODone  25-50 mg Oral QHS   Continuous Infusions: . ceFEPime (MAXIPIME) IV 1 g (05/02/20 1421)   PRN Meds:.alum & mag hydroxide-simeth, aspirin EC, bisacodyl, cyclobenzaprine, guaiFENesin-dextromethorphan, hydrALAZINE, HYDROmorphone (DILAUDID) injection, menthol-cetylpyridinium, ondansetron, oxyCODONE-acetaminophen, phenol  Assessment and Plan: 75 y.o. female is POD 9  right groin sartorius muscle flap secondary to right groin wound infection. She is now POD 11 from initial wash-out and debridement. Wound VAC in place. Change tomorrow. VSS. Afebrile. No leukocytosis. Cefepime continues.  She is seven weeks post-op  right EIA and right common femoral endarterectomy with Dacron patch angioplasty,  right femoral to anterior tibial bypass with 6 mm ringed propatent Gore-Tex graft.  Anemia>multifactorial. H and H stable  ESRD>HD on TTS  -DVT prophylaxis:  Heparin [place]   [name], PA-C [facility] [phone] 05/03/2020  9:53 AM   I have independently interviewed and examined patient and agree with PA assessment and plan above.  Patient was getting ready to dialyze when I saw her.  Groin wound VAC is to suction.  There is a dressing on the right foot that is clean dry and intact.  Shahida Schnackenberg C. Donzetta Matters, MD [facility] [place] Office: [phone] Pager: [phone]

## 2020-05-03 NOTE — Consult Note (Signed)
[facility] Medical Consultation  [name] MWN:[id number] DOB: 24-Jul-1945 DOA: 04/07/2020 PCP: Unk Pinto, MD   Requesting physician: Vascular surgery Date of consultation: 05/03/2020 Reason for consultation: AMS  Impression/Recommendations Active Problems:   Status post surgery   Non-healing surgical wound of right groin   Pressure injury of skin  Metabolic encephalopathy -She appears to have shallow fast breathing, likely secondary to new developed right-sided pleural effusion.  Ordered a CT chest without contrast to further evaluate/quantify the pleural effusion to see whether need IR to tap versus additional HD sessions. -She has been on broad-spectrum antibiotics and her overall infection has been fairly controlled.  And she is not spiking fever and her BP has been stable.  However, no clear etiology for mentation changes in the moment, and send sepsis work-up including lactic acid and blood culture x2.   -VBG showed no CO2 retention. -TSH mildly elevated, will send T4. -Agree to maintain her on cefepime for now. -CT head ordered  New onset of right-sided pleural effusion -Significant compared to chest x-ray done on the 16th. -Less likely from nephro source given the patient never missed HD. -TTE ordered -CT chest, and consider consult IR.  Right groin surgical wound infection -As per vascular team.  IDDM with borderline hypoglycemia -Discontinue standing dose of 3 unit NovoLog 3 times daily AC -Continue current sliding insulin regimen  Hx of pulmonary hypertension -TTE   I will followup again tomorrow. Please contact me if I can be of assistance in the meanwhile. Thank you for this consultation.  Chief Complaint: Feeling tired and sleepy  HPI: 75 year old female patient status post right external iliac and common femoral enterectomy with right femoral to anterior tibial artery bypass recently, ESRD on HD, IDDM, HTN, HLD, moderate pulmonary  hypertension (2019), came to hospital for infected right groin surgical wound.  Incision and drainage of the right groin surgical wound was done on 04/23/2020, patient has been maintained on IV antibiotics and wound VAC and managed by vascular surgery.  Last 2 days, patient became more sleepy, with occasional confusion.  No significant fever or pain.  And patient never missed her HD sessions.  Patient denied any chest pain, no fever chills no cough.  She admitted been feeling very weak and has been eating less as well.  She denied feeling depressed.  Patient not making urine and she denies any diarrhea.   Review of Systems:  14 points of review systems are negative except those mentioned HPI  Past Medical History:  Diagnosis Date  . Allergy   . Anterior dislocation of left shoulder 05/27/2019  . Arthritis    "maybe in my lower back" (08/27/2012)  . 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 Chronic combined systolic and diastolic CHF (congestive heart failure) ([place])   . CKD (chronic kidney disease), stage IV ([place])   . Coronary artery disease    a. NSTEMI in 05/2009 s/p CABG (LIMA-LAD, SVG-diagonal, SVG-OM1/OM 2).   Marland Kitchen Dyspnea    with exertion  . Family history of anesthesia complication    Mom has a hard time to wake up  . Foot ulcer ([place])    "I've had them on both feet" (08/27/2012)  . Gouty arthritis    "right index finger" (08/27/2012)  . History of radiation therapy 06/09/13-07/06/13   left breast 50Gy  . Hyperlipidemia   . Hypertension   . Infection    right second toe  . Ischemic cardiomyopathy  a. 2011: EF 40-45%. b. EF 55-60% in 04/2017 but shortly after as inpatient was 45-50%.  Marland Kitchen LBBB (left bundle branch block)   . Mitral regurgitation   . Neuropathy    Hx; of B/L feet  . NSTEMI (non-ST elevated myocardial infarction) ([place]) 05/23/2009  . Osteomyelitis of foot ([place])   . PAD (peripheral artery disease) ([facility])    a. LE PAD (patient  previously elected hold off L fem-pop), prev followed by Dr. Bridgett Larsson  . PONV (postoperative nausea and vomiting)   . Sinus headache    "occasionally" (08/27/2012)  . Type II diabetes mellitus ([place])   . Vitamin D deficiency    Past Surgical History:  Procedure Laterality Date  . ABDOMINAL AORTOGRAM W/LOWER EXTREMITY Bilateral 03/25/2019   Procedure: ABDOMINAL AORTOGRAM W/LOWER EXTREMITY;  Surgeon: Marty Heck, MD;  Location: [facility];  Service: Cardiovascular;  Laterality: Bilateral;  . ABDOMINAL AORTOGRAM W/LOWER EXTREMITY Bilateral 02/24/2020   Procedure: ABDOMINAL AORTOGRAM W/LOWER EXTREMITY;  Surgeon: Cherre Robins, MD;  Location: [facility];  Service: Cardiovascular;  Laterality: Bilateral;  . AMPUTATION Right 08/27/2012   Procedure: AMPUTATION RAY;  Surgeon: Newt Minion, MD;  Location: [facility] [facility];  Service: Orthopedics;  Laterality: Right;  Right Foot 5th Ray Amputation  . AMPUTATION Left 07/08/2013   Procedure: AMPUTATION RAY;  Surgeon: Newt Minion, MD;  Location: [facility];  Service: Orthopedics;  Laterality: Left;  Left Foot 2nd Ray Amputation  . AMPUTATION Left 04/06/2019   Procedure: LEFT 5TH RAY AMPUTATION;  Surgeon: Newt Minion, MD;  Location: [facility];  Service: Orthopedics;  Laterality: Left;  . AMPUTATION RAY Right 08/27/2012   5th ray/notes 08/27/2012  . AMPUTATION TOE Right 03/06/2017   Procedure: AMPUTATION TOE, INTERPHANGEAL 2ND RIGHT;  Surgeon: [name], DPM;  Location: [facility];  Service: Podiatry;  Laterality: Right;  . AV FISTULA PLACEMENT Right 11/11/2017   Procedure: RIGHT RADIOCEPHALIC  ARTERIOVENOUS FISTULA;  Surgeon: [name], MD;  Location: [facility];  Service: Vascular;  Laterality: Right;  . BREAST LUMPECTOMY  04/20/2013   with biopsy      DR WAKEFIELD  . BREAST LUMPECTOMY WITH NEEDLE LOCALIZATION AND AXILLARY SENTINEL LYMPH NODE BX Left 04/20/2013   Procedure: LEFT BREAST WIRE GUIDED LUMPECTOMY AND AXILLARY SENTINEL LYMPH NODE BX;   Surgeon: Rolm Bookbinder, MD;  Location: [facility];  Service: General;  Laterality: Left;  . CARDIAC CATHETERIZATION  05/24/2009   Archie Endo 05/24/2009 (08/27/2012)  . CATARACT EXTRACTION W/ INTRAOCULAR LENS  IMPLANT, BILATERAL  2000's  . COLONOSCOPY W/ BIOPSIES AND POLYPECTOMY  2010  . CORONARY ARTERY BYPASS GRAFT  2011   "CABG X4" (08/27/2012)  . DENTAL SURGERY  04/30/11   "1 implant" (08/27/2012)  . DILATION AND CURETTAGE OF UTERUS    . ENDARTERECTOMY FEMORAL Left 03/28/2019   Procedure: ENDARTERECTOMY FEMORAL;  Surgeon: [name], MD;  Location: [facility];  Service: Vascular;  Laterality: Left;  . ENDARTERECTOMY FEMORAL Right 03/16/2020   Procedure: RIGHT EXTERNAL ILIAC  ENDARTERECTOMY WITH PATCH ANGIOPLASTY;  Surgeon: [name], MD;  Location: [facility];  Service: Vascular;  Laterality: Right;  . EYE SURGERY    . FEMORAL-POPLITEAL BYPASS GRAFT Left 03/28/2019   Procedure: BYPASS GRAFT FEMORAL-POPLITEAL ARTERY;  Surgeon: [name], MD;  Location: [facility];  Service: Vascular;  Laterality: Left;  . FEMORAL-POPLITEAL BYPASS GRAFT Right 03/16/2020   Procedure: RIGHT COMMON FEMORAL TO ANTERIOR TIBIAL ARTERY BYPASS GRAFT USING PROPATEN GRAFT;  Surgeon: Curt Jews  F, MD;  Location: [facility];  Service: Vascular;  Laterality: Right;  . FINGER SURGERY Right    "index finger; turned out to be gout" (08/27/2012)  . GROIN DEBRIDEMENT Right 04/08/2020   Procedure: GROIN DEBRIDEMENT RIGHT;  Surgeon: Maeola Harman, MD;  Location[facility];  Service: Vascular;  Laterality: Right;  . INCISION AND DRAINAGE OF WOUND Right 04/10/2020   Procedure: IRRIGATION AND DEBRIDEMENT, GROIN WOUND;  Surgeon: Maeola Harman, MD;  Location: [facility] OR;  Service: Vascular;  Laterality: Right;  . IR FLUORO GUIDE CV LINE RIGHT  11/30/2017  . PATCH ANGIOPLASTY Left 03/28/2019   Procedure: Patch Angioplasty;  Surgeon: Larina Earthly, MD;  Location: [facility];  Service: Vascular;  Laterality: Left;  . SHOULDER CLOSED REDUCTION  Left 05/27/2019   Procedure: CLOSED REDUCTION LEFT SHOULDER;  Surgeon: Kathryne Hitch, MD;  Location: [facility];  Service: Orthopedics;  Laterality: Left;  . TEE WITHOUT CARDIOVERSION N/A 12/01/2017   Procedure: TRANSESOPHAGEAL ECHOCARDIOGRAM (TEE);  Surgeon: Lewayne Bunting, MD;  Location: [facility];  Service: Cardiovascular;  Laterality: N/A;   Social History:  reports that she has never smoked. She has never used smokeless tobacco. She reports previous alcohol use. She reports that she does not use drugs.  Allergies  Allergen Reactions  . Atenolol Rash and Other (See Comments)    Exacerbates gout   . Adhesive [Tape] Other (See Comments)    SKIN IS VERY SENSITIVE AND BRUISES AND TEARS EASILY; PLEASE USE AN ALTERNATIVE TO TAPE!!  . Contrast Media [Iodinated Diagnostic Agents] Rash  . Iohexol Rash  . Latex Rash   Family History  Problem Relation Age of Onset  . Kidney cancer Brother 110  . Hypertension Brother   . Breast cancer Sister 23       LCIS; BRCA negative  . Throat cancer Father 11       smoker  . Breast cancer Sister 11       Lobular breast cancer  . Breast cancer Maternal Aunt        dx in her 25s    Prior to Admission medications   Medication Sig Start Date End Date Taking? Authorizing Provider  allopurinol (ZYLOPRIM) 100 MG tablet TAKE 1 TABLET TWICE DAILY TO PREVENT GOUT Patient taking differently: Take 100 mg by mouth 2 (two) times daily. TAKE 1 TABLET TWICE DAILY TO PREVENT GOUT 10/28/19  Yes Lucky Cowboy, MD  ARANESP, ALBUMIN FREE, 100 MCG/0.5ML SOSY injection Inject 100 mcg into the vein every Tuesday.   Yes [provider]  ARTIFICIAL TEAR SOLUTION OP Place 1 drop into both eyes 2 (two) times daily as needed (for dry eyes).   Yes [provider]  aspirin EC 81 MG tablet Take 81 mg by mouth daily.   Yes [provider]  atorvastatin (LIPITOR) 10 MG tablet TAKE 1 TABLET DAILY FOR CHOLESTEROL. Patient taking differently:  Take 10 mg by mouth daily at 6 PM. 03/07/20  Yes McClanahan, Kyra, NP  b complex-vitamin c-folic acid (NEPHRO-VITE) 0.8 MG TABS tablet Take 1 tablet by mouth daily.  03/19/18  Yes [provider]  bisacodyl (DULCOLAX) 10 MG suppository Place 10 mg rectally daily as needed for moderate constipation or mild constipation.   Yes [provider]  cinacalcet (SENSIPAR) 30 MG tablet Take 1 tablet (30 mg total) by mouth daily with supper. 04/19/19  Yes Angiulli, Mcarthur Rossetti, PA-C  cyclobenzaprine (FLEXERIL) 5 MG tablet Take 1 tablet (5 mg total) by mouth 3 (three)  times daily as needed for muscle spasms. 06/22/19  Yes Mcarthur Rossetti, MD  docusate sodium (COLACE) 100 MG capsule Take 100 mg by mouth 2 (two) times daily.    Yes [provider]  Doxercalciferol 2 MCG/ML SOLN Inject 4 mcg into the vein Every Tuesday,Thursday,and Saturday with dialysis.   Yes [provider]  ferric gluconate 125 mg in sodium chloride 0.9 % 100 mL Inject 125 mg into the vein Every Tuesday,Thursday,and Saturday with dialysis.   Yes [provider]  hydrALAZINE (APRESOLINE) 25 MG tablet Take 75 mg by mouth 3 (three) times daily.   Yes [provider]  HYDROcodone-acetaminophen (NORCO/VICODIN) 5-325 MG tablet Take 1 tablet by mouth every 6 (six) hours as needed. Patient taking differently: Take 1 tablet by mouth every 6 (six) hours as needed (for pain). 03/27/20  Yes Dagoberto Ligas, PA-C  insulin NPH-regular Human (70-30) 100 UNIT/ML injection Inject 10 Units into the skin See admin instructions. Inject 10 units into the skin two times a day with a meal and hold if BGL is <79   Yes [provider]  isosorbide dinitrate (ISORDIL) 10 MG tablet Take 10 mg by mouth 3 (three) times daily.   Yes [provider]  METAMUCIL FIBER PO Take 1 capsule by mouth daily as needed (Constipation).    Yes [provider]  Multiple Vitamins-Minerals (PRESERVISION AREDS 2  PO) Take 2 capsules by mouth daily at 12 noon.    Yes [provider]  Nutritional Supplements (NEPRO) LIQD Take by mouth 2 (two) times daily.   Yes [provider]  sevelamer carbonate (RENVELA) 800 MG tablet Take 2 tablets (1,600 mg total) by mouth 3 (three) times daily with meals. 04/19/19  Yes Angiulli, Lavon Paganini, PA-C  Sodium Phosphates (RA SALINE ENEMA) 19-7 GM/118ML ENEM Place 1 enema rectally daily as needed (for constipation not relieved by Dulcolax suppository and call MD if no relief from enema).   Yes [provider]  traZODone (DESYREL) 50 MG tablet Take 50 mg by mouth at bedtime.   Yes [provider]  glucose blood (PRODIGY NO CODING BLOOD GLUC) test strip USE 1 STRIP TO CHECK GLUCOSE THREE TIMES DAILY-DX-E11.9 11/29/19   Unk Pinto, MD  Insulin Pen Needle (BD PEN NEEDLE NANO U/F) 32G X 4 MM MISC USE TO INJECT TWICE DAILY LANTUS INJECTIONS 01/27/20   Unk Pinto, MD  Insulin Syringe-Needle U-100 (BD INSULIN SYRINGE U/F) 31G X 5/16" 1 ML MISC Administer Insulin      3 x /day      with Meals      (Dx-e11.29) Patient taking differently: Inject 1 Syringe into the skin 3 (three) times daily with meals. DX: E11.29 03/06/20   Unk Pinto, MD   Physical Exam: Blood pressure (!) 106/45, pulse 84, temperature 98.6 F (37 C), temperature source Oral, resp. rate 17, weight 58.3 kg, SpO2 100 %. Vitals:   05/03/20 1400 05/03/20 1450  BP: (!) 111/52 (!) 106/45  Pulse:  84  Resp:  17  Temp: 98.9 F (37.2 C) 98.6 F (37 C)  SpO2:  100%     General: Lethargic  Eyes: PERRL  ENT: Mucosa moist  Neck: Supple no JVD  Cardiovascular: RRR, no murmurs  Respiratory: Diminished breathing sound on the right side  Abdomen: Soft nontender nondistended  Skin: Wound VAC on right groin  Musculoskeletal: Normal ROM  Psychiatric: Calm  Neurologic: No focal deficit  Labs on Admission:  Basic Metabolic Panel: Recent Labs  Lab  04/28/20 1031  04/29/20 1426 05/03/20 1025  NA 132* 134* 137  K 4.8 4.7 4.9  CL 97* 99 100  CO2 18* 20* 20*  GLUCOSE 113* 83 84  BUN 52* 53* 64*  CREATININE 5.86* 6.16* 7.12*  CALCIUM 7.6* 7.4* 7.9*  PHOS 5.3* 5.6* 6.2*   Liver Function Tests: Recent Labs  Lab 04/28/20 1031 04/29/20 1426 05/03/20 1025  ALBUMIN 2.1* 1.9* 2.1*   No results for input(s): LIPASE, AMYLASE in the last 168 hours. No results for input(s): AMMONIA in the last 168 hours. CBC: Recent Labs  Lab 04/28/20 0800 04/28/20 1031 05/02/20 0102 05/03/20 0242  WBC 12.4* 11.0* 8.4 8.1  HGB 9.1* 9.1* 8.9* 9.2*  HCT 28.6* 27.1* 26.9* 28.1*  MCV 97.9 96.4 96.4 96.6  PLT 246 256 182 184   Cardiac Enzymes: No results for input(s): CKTOTAL, CKMB, CKMBINDEX, TROPONINI in the last 168 hours. BNP: Invalid input(s): POCBNP CBG: Recent Labs  Lab 05/01/20 2150 05/02/20 0607 05/02/20 1058 05/02/20 1600 05/03/20 0620  GLUCAP 81 84 102* 104* 85    Radiological Exams on Admission: DG Chest 1 View  Result Date: 05/03/2020 CLINICAL DATA:  Altered mental status EXAM: CHEST  1 VIEW COMPARISON:  04/14/2020 FINDINGS: Post CABG changes. Stable cardiomegaly. Densely calcified thoracic aorta. Moderate-sized right-sided pleural effusion, new from prior. Hazy opacity within the right mid to lower lung field. No left-sided pleural effusion. No pneumothorax. IMPRESSION: Moderate-sized right-sided pleural effusion with hazy opacity within the right mid to lower lung field, which may represent atelectasis and/or infiltrate. Electronically Signed   By: Davina Poke D.O.   On: 05/03/2020 16:45    EKG:   Time spent: [facility] Pager [phone] 05/03/2020, 6:51 PM

## 2020-05-04 ENCOUNTER — Inpatient Hospital Stay (HOSPITAL_COMMUNITY): Payer: Medicare Other

## 2020-05-04 DIAGNOSIS — I340 Nonrheumatic mitral (valve) insufficiency: Secondary | ICD-10-CM | POA: Diagnosis not present

## 2020-05-04 DIAGNOSIS — I272 Pulmonary hypertension, unspecified: Secondary | ICD-10-CM

## 2020-05-04 DIAGNOSIS — R69 Illness, unspecified: Secondary | ICD-10-CM

## 2020-05-04 DIAGNOSIS — G9341 Metabolic encephalopathy: Secondary | ICD-10-CM | POA: Diagnosis not present

## 2020-05-04 DIAGNOSIS — J90 Pleural effusion, not elsewhere classified: Secondary | ICD-10-CM

## 2020-05-04 DIAGNOSIS — R404 Transient alteration of awareness: Secondary | ICD-10-CM

## 2020-05-04 DIAGNOSIS — I361 Nonrheumatic tricuspid (valve) insufficiency: Secondary | ICD-10-CM | POA: Diagnosis not present

## 2020-05-04 DIAGNOSIS — I639 Cerebral infarction, unspecified: Secondary | ICD-10-CM | POA: Diagnosis not present

## 2020-05-04 LAB — GLUCOSE, CAPILLARY
Glucose-Capillary: 62 mg/dL — ABNORMAL LOW (ref 70–99)
Glucose-Capillary: 67 mg/dL — ABNORMAL LOW (ref 70–99)
Glucose-Capillary: 83 mg/dL (ref 70–99)
Glucose-Capillary: 92 mg/dL (ref 70–99)
Glucose-Capillary: 79 mg/dL (ref 70–99)
Glucose-Capillary: 78 mg/dL (ref 70–99)

## 2020-05-04 LAB — ECHOCARDIOGRAM COMPLETE
Area-P 1/2: 5.66 cm2
MV M vel: 5.54 m/s
MV Peak grad: 122.5 mmHg
MV VTI: 1.32 cm2
Radius: 0.9 cm
S' Lateral: 3.9 cm
Weight: 2225.76 oz

## 2020-05-04 LAB — CBC
HCT: 27.9 % — ABNORMAL LOW (ref 36.0–46.0)
Hemoglobin: 9.1 g/dL — ABNORMAL LOW (ref 12.0–15.0)
MCH: 31.7 pg (ref 26.0–34.0)
MCHC: 32.6 g/dL (ref 30.0–36.0)
MCV: 97.2 fL (ref 80.0–100.0)
Platelets: 170 10*3/uL (ref 150–400)
RBC: 2.87 MIL/uL — ABNORMAL LOW (ref 3.87–5.11)
RDW: 20.3 % — ABNORMAL HIGH (ref 11.5–15.5)
WBC: 8 10*3/uL (ref 4.0–10.5)
nRBC: 0.5 % — ABNORMAL HIGH (ref 0.0–0.2)

## 2020-05-04 MED ORDER — ASPIRIN 325 MG PO TABS
325.0000 mg | ORAL_TABLET | Freq: Every day | ORAL | Status: DC
  Administered 2020-05-04: 325 mg via ORAL
  Filled 2020-05-04: qty 1

## 2020-05-04 MED ORDER — DEXTROSE-NACL 5-0.45 % IV SOLN: INTRAVENOUS | Status: DC

## 2020-05-04 MED ORDER — STROKE: EARLY STAGES OF RECOVERY BOOK
Freq: Once | Status: AC
  Filled 2020-05-04: qty 1

## 2020-05-04 MED ORDER — ASPIRIN 300 MG RE SUPP: 300.0000 mg | Freq: Every day | RECTAL | Status: DC

## 2020-05-04 MED ORDER — HYDROCODONE-ACETAMINOPHEN 5-325 MG PO TABS
1.0000 | ORAL_TABLET | ORAL | Status: DC | PRN
Start: 2020-05-04 — End: 2020-05-05

## 2020-05-04 NOTE — Progress Notes (Signed)
  Echocardiogram 2D Echocardiogram has been performed.  Jennette Dubin 05/04/2020, 8:54 AM

## 2020-05-04 NOTE — Progress Notes (Signed)
PT Cancellation Note  Patient Details Name: [name] MRN: [id number] DOB: 1945/07/22   Cancelled Treatment:    Reason Eval/Treat Not Completed: Patient at procedure or test/unavailable Pt off floor for ECHO. Will follow.   Marguarite Arbour A Kaybree Williams 05/04/2020, 8:47 AM Marisa Severin, PT, DPT Acute Rehabilitation Services Pager [phone] Office [phone]

## 2020-05-04 NOTE — Progress Notes (Signed)
Hypoglycemic Event  CBG: 62 @0608   Treatment: 4 oz juice/soda  Symptoms: None  Follow-up CBG: Time:0630 CBG Result:67  Possible Reasons for Event: Inadequate meal intake   Treatment: 4 oz juice/soda  Symptoms: None  Follow-up CBG: Time:0651 CBG Result:83  Possible Reasons for Event: Inadequate meal intake  Comments/MD notified:Protocol followed and sugar corrected, will convey to Day RN to inform attending during rounds.    Sharlotte Alamo

## 2020-05-04 NOTE — Progress Notes (Signed)
Occupational Therapy Treatment Patient Details Name: [name] MRN: [id number] DOB: 10-13-1945 Today's Date: 05/04/2020    History of present illness Patient is a 75 y/o female who presents from SNF due to infected groin wound now s/p I&D right groin wound 1/17, s/p washout of wound and Right sartorius muscle flap coverage and wound vac application 6/64/40. PMH includes Lft fem to below knee pop bypass with saphenous vein, toe amputations, breast ca, left shoulder dislocation, CKD, CAD, STEMI, OA.   OT comments  Pt. Seen for skilled OT treatment session.  Session limited secondary to notable pt. Fatigue.  Mod a for grooming task bed level. Pt. Falling asleep during tx attempts. Will attempt to progress next session as pt. Able.   Follow Up Recommendations  SNF;Supervision/Assistance - 24 hour    Equipment Recommendations  3 in 1 bedside commode    Recommendations for Other Services      Precautions / Restrictions Precautions Precautions: Fall;Other (comment) Precaution Comments: keep right heel offloaded per pt, wound vac, soft BP Restrictions Weight Bearing Restrictions: No       Mobility Bed Mobility Overal bed mobility: Needs Assistance Bed Mobility: Supine to Sit;Sit to Supine     Supine to sit: Max assist;HOB elevated Sit to supine: Mod assist   General bed mobility comments: Step by step cues for sequencing, assist with LEs and trunk to get to EOB. Assist to bring LEs into bed to return to supine. Increased time as pt unable to process commands well today.  Transfers     Transfers: Lateral/Scoot Transfers          Lateral/Scoot Transfers: Max assist General transfer comment: Pt declined standing or transferring to chair today; worked on laterally scooting along side bed with Max A, some initation noted but difficulty as pt with poor attention.    Balance Overall balance assessment: Needs assistance Sitting-balance support: Feet unsupported;Bilateral  upper extremity supported Sitting balance-Leahy Scale: Fair Sitting balance - Comments: Supervision for safety.                                   ADL either performed or assessed with clinical judgement   ADL Overall ADL's : Needs assistance/impaired     Grooming: Moderate assistance;Bed level                                 General ADL Comments: pt. limited by fatigue, mod a for washing face, dozing off during tx attempts     Vision       Perception     Praxis      Cognition Arousal/Alertness: Lethargic Behavior During Therapy: Flat affect Overall Cognitive Status: Impaired/Different from baseline Area of Impairment: Orientation;Attention;Memory;Following commands;Safety/judgement;Problem solving                 Orientation Level: Disoriented to;Time;Situation Current Attention Level: Focused Memory: Decreased short-term memory Following Commands: Follows one step commands with increased time;Follows one step commands inconsistently Safety/Judgement: Decreased awareness of safety;Decreased awareness of deficits   Problem Solving: Slow processing;Requires verbal cues;Decreased initiation;Difficulty sequencing;Requires tactile cues General Comments: Pt very slow to process commands. Fixated on right heel throughout session; requires max repetition to follow simple 1 step commands. Easily internally distracted by medical issues, "they are looking at my lungs, heart, its is a lot to keep track of." Difficulty initiating and sequencing movements  today. Reports feelign sort of out of it. Repeating "2018"when asked the month, "no i know that is not right, 2018?"        Exercises General Exercises - Lower Extremity Ankle Circles/Pumps: AROM;Both;10 reps;Supine Quad Sets: AROM;Both;10 reps;Supine   Shoulder Instructions       General Comments VSS on RA.    Pertinent Vitals/ Pain       Pain Assessment: No/denies pain Faces Pain Scale: No  hurt  Home Living                                          Prior Functioning/Environment              Frequency  Min 2X/week        Progress Toward Goals  OT Goals(current goals can now be found in the care plan section)  Progress towards OT goals: Progressing toward goals     Plan      Co-evaluation                 AM-PAC OT "6 Clicks" Daily Activity     Outcome Measure   Help from another person eating meals?: A Little Help from another person taking care of personal grooming?: A Little Help from another person toileting, which includes using toliet, bedpan, or urinal?: A Lot Help from another person bathing (including washing, rinsing, drying)?: A Lot Help from another person to put on and taking off regular upper body clothing?: A Little Help from another person to put on and taking off regular lower body clothing?: A Lot 6 Click Score: 15    End of Session    OT Visit Diagnosis: Unsteadiness on feet (R26.81);Muscle weakness (generalized) (M62.81);Pain;Other symptoms and signs involving cognitive function   Activity Tolerance Patient limited by fatigue   Patient Left in bed;with call bell/phone within reach;with bed alarm set   Nurse Communication          Time: 5697-9480 OT Time Calculation (min): 8 min  Charges: OT General Charges $OT Visit: 1 Visit OT Treatments $Self Care/Home Management : 8-22 mins  [name], COTA/L Acute Rehabilitation [phone]   Janice Coffin 05/04/2020, 1:09 PM

## 2020-05-04 NOTE — Progress Notes (Signed)
Physical Therapy Treatment Patient Details Name: [name] MRN: [id number] DOB: February 23, 1946 Today's Date: 05/04/2020    History of Present Illness Patient is a 75 y/o female who presents from SNF due to infected groin wound now s/p I&D right groin wound 1/17, s/p washout of wound and Right sartorius muscle flap coverage and wound vac application 09/20/05. PMH includes Lft fem to below knee pop bypass with saphenous vein, toe amputations, breast ca, left shoulder dislocation, CKD, CAD, STEMI, OA.    PT Comments    Patient lethargic upon PT arrival due to just waking up. Appears more confused today. Slow processing, poor attention, decreased ability to follow simple 1 step commands and requires max repetition and step by step cues for sequencing during session. Requires Max A for bed mobility with poor initiation. Seems distracted and overwhelmed by medical issues. Practiced laterally scooting along side bed with Max A and difficulty assisting. Declined OOB to chair or standing due to being tired/fatigued. Continue to recommend SNF. Will follow.   Follow Up Recommendations  SNF;Supervision for mobility/OOB     Equipment Recommendations  None recommended by PT    Recommendations for Other Services       Precautions / Restrictions Precautions Precautions: Fall;Other (comment) Precaution Comments: keep right heel offloaded per pt, wound vac, soft BP Restrictions Weight Bearing Restrictions: No    Mobility  Bed Mobility Overal bed mobility: Needs Assistance Bed Mobility: Supine to Sit;Sit to Supine     Supine to sit: Max assist;HOB elevated Sit to supine: Mod assist   General bed mobility comments: Step by step cues for sequencing, assist with LEs and trunk to get to EOB. Assist to bring LEs into bed to return to supine. Increased time as pt unable to process commands well today.  Transfers     Transfers: Lateral/Scoot Transfers          Lateral/Scoot Transfers: Max  assist General transfer comment: Pt declined standing or transferring to chair today; worked on laterally scooting along side bed with Max A, some initation noted but difficulty as pt with poor attention.  Ambulation/Gait                 Stairs             Wheelchair Mobility    Modified Rankin (Stroke Patients Only)       Balance Overall balance assessment: Needs assistance Sitting-balance support: Feet unsupported;Bilateral upper extremity supported Sitting balance-Leahy Scale: Fair Sitting balance - Comments: Supervision for safety.                                    Cognition Arousal/Alertness: Lethargic Behavior During Therapy: Flat affect Overall Cognitive Status: Impaired/Different from baseline Area of Impairment: Orientation;Attention;Memory;Following commands;Safety/judgement;Problem solving                 Orientation Level: Disoriented to;Time;Situation Current Attention Level: Focused Memory: Decreased short-term memory Following Commands: Follows one step commands with increased time;Follows one step commands inconsistently Safety/Judgement: Decreased awareness of safety;Decreased awareness of deficits   Problem Solving: Slow processing;Requires verbal cues;Decreased initiation;Difficulty sequencing;Requires tactile cues General Comments: Pt very slow to process commands. Fixated on right heel throughout session; requires max repetition to follow simple 1 step commands. Easily internally distracted by medical issues, "they are looking at my lungs, heart, its is a lot to keep track of." Difficulty initiating and sequencing movements today. Reports feelign sort  of out of it. Repeating "2018"when asked the month, "no i know that is not right, 2018?"      Exercises General Exercises - Lower Extremity Ankle Circles/Pumps: AROM;Both;10 reps;Supine Quad Sets: AROM;Both;10 reps;Supine    General Comments General comments (skin  integrity, edema, etc.): VSS on RA.      Pertinent Vitals/Pain Pain Assessment: Faces Faces Pain Scale: No hurt    Home Living                      Prior Function            PT Goals (current goals can now be found in the care plan section) Progress towards PT goals: Not progressing toward goals - comment (due to lethargy/cognition)    Frequency    Min 3X/week      PT Plan Current plan remains appropriate    Co-evaluation              AM-PAC PT "6 Clicks" Mobility   Outcome Measure  Help needed turning from your back to your side while in a flat bed without using bedrails?: A Lot Help needed moving from lying on your back to sitting on the side of a flat bed without using bedrails?: A Lot Help needed moving to and from a bed to a chair (including a wheelchair)?: A Lot Help needed standing up from a chair using your arms (e.g., wheelchair or bedside chair)?: Total Help needed to walk in hospital room?: Total Help needed climbing 3-5 steps with a railing? : Total 6 Click Score: 9    End of Session Equipment Utilized During Treatment: Gait belt Activity Tolerance: Patient limited by fatigue;Other (comment);Patient limited by lethargy (cognition) Patient left: in bed;with call bell/phone within reach;with bed alarm set Nurse Communication: Mobility status PT Visit Diagnosis: Unsteadiness on feet (R26.81);Muscle weakness (generalized) (M62.81);Other abnormalities of gait and mobility (R26.89)     Time: 3335-4562 PT Time Calculation (min) (ACUTE ONLY): 20 min  Charges:  $Therapeutic Activity: 8-22 mins                     Marisa Severin, PT, DPT Acute Rehabilitation Services Pager [phone] Office [phone]       Marguarite Arbour A Sabra Heck 05/04/2020, 11:41 AM

## 2020-05-04 NOTE — Progress Notes (Addendum)
[facility] PROGRESS NOTE    Progress Note  [name]  BJS:[id number] DOB: 1945-06-22 DOA: 04/07/2020 PCP: Unk Pinto, MD     Brief Narrative:   [name] is an 75 y.o. female past medical history of end-stage renal disease on hemodialysis, status post right external iliac and common femoral enterectomy with right femoral to anterior tibial artery bypass insulin-dependent diabetes mellitus type 2, essential hypertension moderate pulmonary hypertension who came into the hospital for an infected right groin surgical wound on 04/23/2020 status post I&D and wound VAC started empirically on antibiotics by vascular surgery. We are consulted as over the last 2 days she became more sleepy with occasional confusion.  Assessment/Plan:   Acute metabolic encephalopathy/concern about acute CVA: Sepsis work-up was started no leukocytosis. Sepsis is unlikely she has been on IV empiric antibiotics since 04/23/2020 for surgical wound infection. She is not septic she was not septic on 05/03/2020. Blood pressure stable she is satting greater than 96% Done on 05/03/2020, showed no acute intracranial processes, there is a remote basal ganglia left cerebellar lacunar insult. She is significantly weak on the right side of her upper extremity about a 2 out of 5, lower extremity is about a 2 out of 5 2, she has some facial droop. HgbA1c, fasting lipid panel  MRI, MRA of the brain without contrast  PT, OT, Speech consult  Carotid dopplers  Transthoracic Echo,   Antiplatelet therapy Atorvastatin 80  BP goal: permissive HTN upto 220/120 mmHg Telemetry monitoring  New onset right-sided pleural effusion ED of the chest was done that showed a large right pleural effusion a small to moderate left pleural effusion with compressive hemoptysis, there is a subtotal collapse of the right lower lobe.  Right groin surgical wound infection: Continue further management for vascular.  IV antibiotics  per vascular.  Insulin-dependent diabetes mellitus type 2:  Oral hypoglycemic agents blood glucose stable. Hold all insulins.   Transient alteration of awareness   Status post surgery   Non-healing surgical wound of right groin   Pressure injury of skin  RN Pressure Injury Documentation: Pressure Injury 04/23/2020 Buttocks Right;Left;Mid Stage 1 -  Intact skin with non-blanchable redness of a localized area usually over a bony prominence. redden and tender to touch (Active)  04/07/2020 1930  Location: Buttocks  Location Orientation: Right;Left;Mid  Staging: Stage 1 -  Intact skin with non-blanchable redness of a localized area usually over a bony prominence.  Wound Description (Comments): redden and tender to touch  Present on Admission:     Estimated body mass index is 23.88 kg/m as calculated from the following:   Height as of 04/20/20: 5\' 4"  (1.626 m).   Weight as of this encounter: 63.1 kg.    DVT prophylaxis: Heparin Family Communication: None Status is: Inpatient  Remains inpatient appropriate because:Hemodynamically unstable   Dispo: The patient is from: SNF              Anticipated d/c is to: SNF              Anticipated d/c date is: 2 days              Patient currently is not medically stable to d/c.   Difficult to place patient No        Code Status:     Code Status Orders  (From admission, onward)         Start     Ordered   04/23/20 0754  Full code  Continuous        04/23/20 0753        Code Status History    Date Active Date Inactive Code Status Order ID Comments User Context   03/16/2020 1404 03/27/2020 2121 Full Code [id number]  Iline Oven Inpatient   02/24/2020 1445 02/24/2020 2157 Full Code [id number]  Cherre Robins, MD Inpatient   04/07/2019 1420 04/20/2019 2040 Full Code [id number]  Cathlyn Parsons, PA-C Inpatient   04/07/2019 1420 04/07/2019 1420 Full Code [id number]  Cathlyn Parsons, PA-C Inpatient   03/23/2019 1724  04/07/2019 1418 Full Code [id number]  Lequita Halt, MD ED   11/26/2017 1456 12/04/2017 1733 Full Code [id number]  Reyne Dumas, MD ED   11/03/2017 1122 11/05/2017 1814 DNR [id number]  Velna Ochs, MD ED   05/04/2017 0048 05/07/2017 1608 Full Code [id number]  Jani Gravel, MD Inpatient   06/04/2013 1656 06/08/2013 1954 Full Code [id number]  Barton Dubois, MD Inpatient   04/20/2013 1522 04/21/2013 1727 Full Code [id number]  Rolm Bookbinder, MD Inpatient   08/27/2012 1803 08/28/2012 1920 Full Code [id number]  Newt Minion, MD Inpatient   Advance Care Planning Activity        IV Access:    Peripheral IV   Procedures and diagnostic studies:   DG Chest 1 View  Result Date: 05/03/2020 CLINICAL DATA:  Altered mental status EXAM: CHEST  1 VIEW COMPARISON:  04/10/2020 FINDINGS: Post CABG changes. Stable cardiomegaly. Densely calcified thoracic aorta. Moderate-sized right-sided pleural effusion, new from prior. Hazy opacity within the right mid to lower lung field. No left-sided pleural effusion. No pneumothorax. IMPRESSION: Moderate-sized right-sided pleural effusion with hazy opacity within the right mid to lower lung field, which may represent atelectasis and/or infiltrate. Electronically Signed   By: Davina Poke D.O.   On: 05/03/2020 16:45   CT HEAD WO CONTRAST  Result Date: 05/03/2020 CLINICAL DATA:  Mental status change, unknown cause EXAM: CT HEAD WITHOUT CONTRAST TECHNIQUE: Contiguous axial images were obtained from the base of the skull through the vertex without intravenous contrast. COMPARISON:  11/03/2017. FINDINGS: Brain: No acute infarct or intracranial hemorrhage. No mass lesion. No midline shift, ventriculomegaly or extra-axial fluid collection. Chronic bilateral basal ganglia and left cerebellar lacunar insults. Mild cerebral atrophy with ex vacuo dilatation. Chronic microvascular ischemic changes. Vascular: No hyperdense vessel or unexpected calcification. Skull: Negative for fracture or  focal lesion. Sinuses/Orbits: Normal orbits. Right maxillary sinus opacification with layering secretions. No mastoid effusion. Other: None. IMPRESSION: No acute intracranial process. Remote basal ganglia and left cerebellar lacunar insults. Mild cerebral atrophy and chronic microvascular ischemic changes. Right maxillary sinus disease. Electronically Signed   By: Primitivo Gauze M.D.   On: 05/03/2020 21:53   CT CHEST WO CONTRAST  Result Date: 05/03/2020 CLINICAL DATA:  Pleural effusion EXAM: CT CHEST WITHOUT CONTRAST TECHNIQUE: Multidetector CT imaging of the chest was performed following the standard protocol without IV contrast. COMPARISON:  None. FINDINGS: Cardiovascular: Coronary artery bypass grafting has been performed. Extensive calcification of the mitral valve annulus. Mild global cardiomegaly. No pericardial effusion. The central pulmonary arteries are of normal caliber. Extensive atherosclerotic calcification within the thoracic aorta. Aberrant origin of the right subclavian artery. No aortic aneurysm. Mediastinum/Nodes: Multiple subcentimeter nodules within the thyroid gland result in heterogeneous enhancement. The gland is not enlarged. No pathologic thoracic adenopathy. The esophagus is unremarkable. Lungs/Pleura: Small to moderate left and large right pleural effusions are present. There is subtotal collapse of the a right lower lobe. Mild compressive atelectasis  of the left lower lobe. No superimposed focal pulmonary infiltrate. No pneumothorax. Central airways are widely patent. Upper Abdomen: Extensive atherosclerotic calcification noted within the visualized abdominal aorta and visceral vasculature. No acute abnormality. Musculoskeletal: Osseous structures are age-appropriate. No acute bone abnormality. IMPRESSION: Large right pleural effusion and small to moderate left pleural effusion with associated bibasilar compressive atelectasis. There is subtotal collapse of the right lower lobe.  No superimposed focal pulmonary infiltrate. Peripheral vascular disease. Aortic Atherosclerosis (ICD10-I70.0). Electronically Signed   By: Fidela Salisbury MD   On: 05/03/2020 22:48     Medical Consultants:    None.  Anti-Infectives:   Cefepime  Subjective:    [name] She still very weak  Objective:    Vitals:   05/03/20 1955 05/03/20 2358 05/04/20 0410 05/04/20 0751  BP: (!) 121/57 (!) 126/56 (!) 124/52 (!) 115/38  Pulse: 83 84 83 84  Resp: 18 16 20 18   Temp: 97.7 F (36.5 C) 98 F (36.7 C) 98.2 F (36.8 C) 97.9 F (36.6 C)  TempSrc: Oral Oral Oral Oral  SpO2: 99% 98% 97% 96%  Weight:   63.1 kg    SpO2: 96 % O2 Flow Rate (L/min): 2 L/min   Intake/Output Summary (Last 24 hours) at 05/04/2020 0837 Last data filed at 05/03/2020 2000 Gross per 24 hour  Intake 420 ml  Output 2050 ml  Net -1630 ml   Filed Weights   05/03/20 1100 05/03/20 1341 05/04/20 0410  Weight: 60.3 kg 58.3 kg 63.1 kg    Exam: General exam: In no acute distress. Respiratory system: Good air movement and clear to auscultation. Cardiovascular system: S1 & S2 heard, RRR. No JVD, murmurs, rubs, gallops or clicks.  Gastrointestinal system: Abdomen is nondistended, soft and nontender.  Central nervous system: She is alert and oriented only to person, 3-12 are grossly intact except for 7 when she has a mild facial droop muscle strength is significantly decreased on the left Extremities: No pedal edema. Skin: No rashes, lesions or ulcers Psychiatry: No judgment and insight of medical condition   Data Reviewed:    Labs: Basic Metabolic Panel: Recent Labs  Lab 04/28/20 1031 04/29/20 1426 05/03/20 1025  NA 132* 134* 137  K 4.8 4.7 4.9  CL 97* 99 100  CO2 18* 20* 20*  GLUCOSE 113* 83 84  BUN 52* 53* 64*  CREATININE 5.86* 6.16* 7.12*  CALCIUM 7.6* 7.4* 7.9*  PHOS 5.3* 5.6* 6.2*   GFR Estimated Creatinine Clearance: 6 mL/min (A) (by C-G formula based on SCr of 7.12 mg/dL  (H)). Liver Function Tests: Recent Labs  Lab 04/28/20 1031 04/29/20 1426 05/03/20 1025  ALBUMIN 2.1* 1.9* 2.1*   No results for input(s): LIPASE, AMYLASE in the last 168 hours. No results for input(s): AMMONIA in the last 168 hours. Coagulation profile No results for input(s): INR, PROTIME in the last 168 hours. COVID-19 Labs  No results for input(s): DDIMER, FERRITIN, LDH, CRP in the last 72 hours.  Lab Results  Component Value Date   SARSCOV2NAA NEGATIVE 05/03/2020   SARSCOV2NAA NEGATIVE 05/04/2020   [place] NEGATIVE 03/26/2020   [place] NEGATIVE 03/13/2020    CBC: Recent Labs  Lab 04/28/20 0800 04/28/20 1031 05/02/20 0102 05/03/20 0242 05/04/20 0237  WBC 12.4* 11.0* 8.4 8.1 8.0  HGB 9.1* 9.1* 8.9* 9.2* 9.1*  HCT 28.6* 27.1* 26.9* 28.1* 27.9*  MCV 97.9 96.4 96.4 96.6 97.2  PLT 246 256 182 184 170   Cardiac Enzymes: No results for input(s): CKTOTAL, CKMB,  CKMBINDEX, TROPONINI in the last 168 hours. BNP (last 3 results) No results for input(s): PROBNP in the last 8760 hours. CBG: Recent Labs  Lab 05/03/20 0620 05/03/20 2125 05/04/20 0608 05/04/20 0630 05/04/20 0651  GLUCAP 85 78 62* 67* 83   D-Dimer: No results for input(s): DDIMER in the last 72 hours. Hgb A1c: No results for input(s): HGBA1C in the last 72 hours. Lipid Profile: No results for input(s): CHOL, HDL, LDLCALC, TRIG, CHOLHDL, LDLDIRECT in the last 72 hours. Thyroid function studies: Recent Labs    05/03/20 1728  TSH 6.876*   Anemia work up: No results for input(s): VITAMINB12, FOLATE, FERRITIN, TIBC, IRON, RETICCTPCT in the last 72 hours. Sepsis Labs: Recent Labs  Lab 04/28/20 1031 05/02/20 0102 05/03/20 0242 05/03/20 1728 05/03/20 2048 05/04/20 0237  WBC 11.0* 8.4 8.1  --   --  8.0  LATICACIDVEN  --   --   --  0.8 0.9  --    Microbiology Recent Results (from the past 240 hour(s))  SARS Coronavirus 2 by RT PCR (hospital order, performed in [place] hospital lab)  Nasopharyngeal Nasopharyngeal Swab     Status: None   Collection Time: 05/03/20  6:01 PM   Specimen: Nasopharyngeal Swab  Result Value Ref Range Status   SARS Coronavirus 2 NEGATIVE NEGATIVE Final    Comment: (NOTE) SARS-CoV-2 target nucleic acids are NOT DETECTED.  The SARS-CoV-2 RNA is generally detectable in upper and lower respiratory specimens during the acute phase of infection. The lowest concentration of SARS-CoV-2 viral copies this assay can detect is 250 copies / mL. A negative result does not preclude SARS-CoV-2 infection and should not be used as the sole basis for treatment or other patient management decisions.  A negative result may occur with improper specimen collection / handling, submission of specimen other than nasopharyngeal swab, presence of viral mutation(s) within the areas targeted by this assay, and inadequate number of viral copies (<250 copies / mL). A negative result must be combined with clinical observations, patient history, and epidemiological information.  Fact Sheet for Patients:   StrictlyIdeas.no  Fact Sheet for Healthcare Providers: BankingDealers.co.za  This test is not yet approved or  cleared by the [place] FDA and has been authorized for detection and/or diagnosis of SARS-CoV-2 by FDA under an Emergency Use Authorization (EUA).  This EUA will remain in effect (meaning this test can be used) for the duration of the COVID-19 declaration under Section 564(b)(1) of the Act, 21 U.S.C. section 360bbb-3(b)(1), unless the authorization is terminated or revoked sooner.  Performed at [facility], [place] [address], [place], [place] [address]      Medications:   . allopurinol  100 mg Oral BID  . atorvastatin  10 mg Oral Daily  . Chlorhexidine Gluconate Cloth  6 each Topical Q0600  . cinacalcet  30 mg Oral Q supper  . collagenase   Topical Daily  . darbepoetin (ARANESP) injection -  DIALYSIS  60 mcg Intravenous Q Thu-HD  . docusate sodium  100 mg Oral BID  . feeding supplement (NEPRO CARB STEADY)   Oral BID  . heparin  5,000 Units Subcutaneous Q8H  . hydrALAZINE  50 mg Oral TID  . insulin aspart  0-5 Units Subcutaneous QHS  . insulin aspart  0-9 Units Subcutaneous TID WC  . isosorbide dinitrate  10 mg Oral TID  . multivitamin  1 tablet Oral QHS  . pantoprazole  40 mg Oral Daily  . sevelamer carbonate  1,600  mg Oral TID WC  . traZODone  25-50 mg Oral QHS   Continuous Infusions: . ceFEPime (MAXIPIME) IV Stopped (05/03/20 1846)      LOS: 11 days   Charlynne Cousins  [facility]  05/04/2020, 8:37 AM

## 2020-05-04 NOTE — Consult Note (Addendum)
[facility] Nurse follow-up Note: Vascular team following for assessment and plan of care for right groin.   Vac dressing changed. Pt medicated for pain prior to the procedure and tolerated with mod amt discomfort.  Wound type: Right groin with full thickness post-op wound, appearance unchanged since previous assessment, small amt pink drainage in cannister. Periwound: intact skin surrounding Dressing procedure/placement/frequency: Applied barrier ring to wound edges to attempt to maintain a seal. One piece black foam to 112mm cont suction. [place] team will plan to change dressing on Mon if patient is still in the hospital at that time.  Julien Girt MSN, RN, [place], [place], [place]

## 2020-05-04 NOTE — Progress Notes (Addendum)
Progress Note    05/04/2020 7:19 AM 10 Days Post-Op  Subjective: No complaints   Vitals:   05/03/20 2358 05/04/20 0410  BP: (!) 126/56 (!) 124/52  Pulse: 84 83  Resp: 16 20  Temp: 98 F (36.7 C) 98.2 F (36.8 C)  SpO2: 98% 97%    Physical Exam: General appearance: Awake, in no apparent distress. Pleasant, flat affect Cardiac: Heart rate and rhythm are regular Respirations: Nonlabored Incisions: Right groin vac dressing in place with good seal; some peri-wound brawny edema. Minimal thin, bloody output.  Right lower leg incision is well approximated without signs of infection Extremities: Right foot is warm with intact sensation and motor function. Dry dressing in place over right foot wound. RUE AVF with good thrill.      CBC    Component Value Date/Time   WBC 8.0 05/04/2020 0237   RBC 2.87 (L) 05/04/2020 0237   HGB 9.1 (L) 05/04/2020 0237   HGB 11.7 05/18/2017 1218   HGB 12.3 02/02/2017 1136   HCT 27.9 (L) 05/04/2020 0237   HCT 34.7 05/18/2017 1218   HCT 37.4 02/02/2017 1136   PLT 170 05/04/2020 0237   PLT 227 05/18/2017 1218   MCV 97.2 05/04/2020 0237   MCV 87 05/18/2017 1218   MCV 90.6 02/02/2017 1136   MCH 31.7 05/04/2020 0237   MCHC 32.6 05/04/2020 0237   RDW 20.3 (H) 05/04/2020 0237   RDW 16.0 (H) 05/18/2017 1218   RDW 15.7 (H) 02/02/2017 1136   LYMPHSABS 1.1 04/19/2020 2141   LYMPHSABS 1.8 02/02/2017 1136   MONOABS 0.7 04/24/2020 2141   MONOABS 0.8 02/02/2017 1136   EOSABS 0.1 04/13/2020 2141   EOSABS 0.3 02/02/2017 1136   BASOSABS 0.0 04/17/2020 2141   BASOSABS 0.1 02/02/2017 1136    BMET    Component Value Date/Time   NA 137 05/03/2020 1025   NA 143 05/18/2017 1218   NA 143 02/02/2017 1137   K 4.9 05/03/2020 1025   K 3.8 02/02/2017 1137   CL 100 05/03/2020 1025   CO2 20 (L) 05/03/2020 1025   CO2 24 02/02/2017 1137   GLUCOSE 84 05/03/2020 1025   GLUCOSE 74 02/02/2017 1137   BUN 64 (H) 05/03/2020 1025   BUN 76 (HH) 05/18/2017 1218    BUN 63.4 (H) 02/02/2017 1137   CREATININE 7.12 (H) 05/03/2020 1025   CREATININE 5.12 (H) 02/13/2020 1518   CREATININE 3.0 (HH) 02/02/2017 1137   CALCIUM 7.9 (L) 05/03/2020 1025   CALCIUM 9.9 02/02/2017 1137   GFRNONAA 6 (L) 05/03/2020 1025   GFRNONAA 8 (L) 02/13/2020 1518   GFRAA 9 (L) 02/13/2020 1518     Intake/Output Summary (Last 24 hours) at 05/04/2020 0719 Last data filed at 05/03/2020 2000 Gross per 24 hour  Intake 420 ml  Output 2050 ml  Net -1630 ml    HOSPITAL MEDICATIONS Scheduled Meds: . allopurinol  100 mg Oral BID  . atorvastatin  10 mg Oral Daily  . Chlorhexidine Gluconate Cloth  6 each Topical Q0600  . cinacalcet  30 mg Oral Q supper  . collagenase   Topical Daily  . darbepoetin (ARANESP) injection - DIALYSIS  60 mcg Intravenous Q Thu-HD  . docusate sodium  100 mg Oral BID  . feeding supplement (NEPRO CARB STEADY)   Oral BID  . heparin  5,000 Units Subcutaneous Q8H  . hydrALAZINE  50 mg Oral TID  . insulin aspart  0-5 Units Subcutaneous QHS  . insulin aspart  0-9 Units Subcutaneous  TID WC  . isosorbide dinitrate  10 mg Oral TID  . multivitamin  1 tablet Oral QHS  . pantoprazole  40 mg Oral Daily  . sevelamer carbonate  1,600 mg Oral TID WC  . traZODone  25-50 mg Oral QHS   Continuous Infusions: . ceFEPime (MAXIPIME) IV Stopped (05/03/20 1846)   PRN Meds:.alum & mag hydroxide-simeth, aspirin EC, bisacodyl, cyclobenzaprine, guaiFENesin-dextromethorphan, hydrALAZINE, HYDROmorphone (DILAUDID) injection, menthol-cetylpyridinium, ondansetron, oxyCODONE-acetaminophen, phenol  Assessment and Plan: 75 y.o.femaleis POD 9  right groin sartorius muscle flapsecondary to right groin wound infection. She is now POD 11 from initial wash-out and debridement. Wound VAC in place. Change today. VSS. Afebrile. No leukocytosis. Cefepime continues.  She is seven weeks post-op right EIA and right common femoral endarterectomy with Dacron patch angioplasty,  right femoral  to anterior tibial bypass with 6 mm ringed propatent Gore-Tex graft.  New right pleural effusion. Appreciate medical team's management.  Anemia>multifactorial. H and H stable  ESRD>HD on TTS  -DVT prophylaxis:  Heparin [place]       [name], PA-C [facility] [phone] 05/04/2020  7:19 AM   I have examined the patient, reviewed and agree with above.  Appreciate help of [facility].  Multiple medical issues with continued slow deterioration.  Right groin VAC in place.  Right foot continues to heal.  Curt Jews, MD 05/04/2020 1:50 PM

## 2020-05-04 NOTE — Progress Notes (Signed)
[facility] Progress Note   Subjective: Seen in room. Appeared slightly confused but says she has just awakened. Oriented X 3. No C/Os. HD tomorrow on schedule.   Objective Vitals:   05/03/20 1955 05/03/20 2358 05/04/20 0410 05/04/20 0751  BP: (!) 121/57 (!) 126/56 (!) 124/52 (!) 115/38  Pulse: 83 84 83 84  Resp: 18 16 20 18   Temp: 97.7 F (36.5 C) 98 F (36.7 C) 98.2 F (36.8 C) 97.9 F (36.6 C)  TempSrc: Oral Oral Oral Oral  SpO2: 99% 98% 97% 96%  Weight:   63.1 kg    Physical Exam General: Ill appearing pleasant older female in NAD Heart: G9,F6 2/6 systolic M.  Lungs: Bilateral breath sounds decreased in bases otherwise CTAB. No WOB.  Abdomen: S, NT, Active BS Extremities: No LE edema. Drsg R foot. Wound vac intact to R femoral wound.  Dialysis Access: R AVF + bruit    Additional Objective Labs: Basic Metabolic Panel: Recent Labs  Lab 04/28/20 1031 04/29/20 1426 05/03/20 1025  NA 132* 134* 137  K 4.8 4.7 4.9  CL 97* 99 100  CO2 18* 20* 20*  GLUCOSE 113* 83 84  BUN 52* 53* 64*  CREATININE 5.86* 6.16* 7.12*  CALCIUM 7.6* 7.4* 7.9*  PHOS 5.3* 5.6* 6.2*   Liver Function Tests: Recent Labs  Lab 04/28/20 1031 04/29/20 1426 05/03/20 1025  ALBUMIN 2.1* 1.9* 2.1*   No results for input(s): LIPASE, AMYLASE in the last 168 hours. CBC: Recent Labs  Lab 04/28/20 0800 04/28/20 1031 05/02/20 0102 05/03/20 0242 05/04/20 0237  WBC 12.4* 11.0* 8.4 8.1 8.0  HGB 9.1* 9.1* 8.9* 9.2* 9.1*  HCT 28.6* 27.1* 26.9* 28.1* 27.9*  MCV 97.9 96.4 96.4 96.6 97.2  PLT 246 256 182 184 170   Blood Culture    Component Value Date/Time   SDES BLOOD THUMB 05/03/2020 2030   [place]  05/03/2020 2030    AEROBIC BOTTLE ONLY Blood Culture results may not be optimal due to an inadequate volume of blood received in culture bottles Performed at [facility] [address] [place], [place] [address]    CULT PENDING 05/03/2020 2030   REPTSTATUS PENDING  05/03/2020 2030    Cardiac Enzymes: No results for input(s): CKTOTAL, CKMB, CKMBINDEX, TROPONINI in the last 168 hours. CBG: Recent Labs  Lab 05/03/20 0620 05/03/20 2125 05/04/20 0608 05/04/20 0630 05/04/20 0651  GLUCAP 85 78 62* 67* 83   Iron Studies: No results for input(s): IRON, TIBC, TRANSFERRIN, FERRITIN in the last 72 hours. @lablastinr3 @ Studies/Results: DG Chest 1 View  Result Date: 05/03/2020 CLINICAL DATA:  Altered mental status EXAM: CHEST  1 VIEW COMPARISON:  04/19/2020 FINDINGS: Post CABG changes. Stable cardiomegaly. Densely calcified thoracic aorta. Moderate-sized right-sided pleural effusion, new from prior. Hazy opacity within the right mid to lower lung field. No left-sided pleural effusion. No pneumothorax. IMPRESSION: Moderate-sized right-sided pleural effusion with hazy opacity within the right mid to lower lung field, which may represent atelectasis and/or infiltrate. Electronically Signed   By: Davina Poke D.O.   On: 05/03/2020 16:45   CT HEAD WO CONTRAST  Result Date: 05/03/2020 CLINICAL DATA:  Mental status change, unknown cause EXAM: CT HEAD WITHOUT CONTRAST TECHNIQUE: Contiguous axial images were obtained from the base of the skull through the vertex without intravenous contrast. COMPARISON:  11/03/2017. FINDINGS: Brain: No acute infarct or intracranial hemorrhage. No mass lesion. No midline shift, ventriculomegaly or extra-axial fluid collection. Chronic bilateral basal ganglia and left cerebellar lacunar  insults. Mild cerebral atrophy with ex vacuo dilatation. Chronic microvascular ischemic changes. Vascular: No hyperdense vessel or unexpected calcification. Skull: Negative for fracture or focal lesion. Sinuses/Orbits: Normal orbits. Right maxillary sinus opacification with layering secretions. No mastoid effusion. Other: None. IMPRESSION: No acute intracranial process. Remote basal ganglia and left cerebellar lacunar insults. Mild cerebral atrophy and  chronic microvascular ischemic changes. Right maxillary sinus disease. Electronically Signed   By: Primitivo Gauze M.D.   On: 05/03/2020 21:53   CT CHEST WO CONTRAST  Result Date: 05/03/2020 CLINICAL DATA:  Pleural effusion EXAM: CT CHEST WITHOUT CONTRAST TECHNIQUE: Multidetector CT imaging of the chest was performed following the standard protocol without IV contrast. COMPARISON:  None. FINDINGS: Cardiovascular: Coronary artery bypass grafting has been performed. Extensive calcification of the mitral valve annulus. Mild global cardiomegaly. No pericardial effusion. The central pulmonary arteries are of normal caliber. Extensive atherosclerotic calcification within the thoracic aorta. Aberrant origin of the right subclavian artery. No aortic aneurysm. Mediastinum/Nodes: Multiple subcentimeter nodules within the thyroid gland result in heterogeneous enhancement. The gland is not enlarged. No pathologic thoracic adenopathy. The esophagus is unremarkable. Lungs/Pleura: Small to moderate left and large right pleural effusions are present. There is subtotal collapse of the a right lower lobe. Mild compressive atelectasis of the left lower lobe. No superimposed focal pulmonary infiltrate. No pneumothorax. Central airways are widely patent. Upper Abdomen: Extensive atherosclerotic calcification noted within the visualized abdominal aorta and visceral vasculature. No acute abnormality. Musculoskeletal: Osseous structures are age-appropriate. No acute bone abnormality. IMPRESSION: Large right pleural effusion and small to moderate left pleural effusion with associated bibasilar compressive atelectasis. There is subtotal collapse of the right lower lobe. No superimposed focal pulmonary infiltrate. Peripheral vascular disease. Aortic Atherosclerosis (ICD10-I70.0). Electronically Signed   By: Fidela Salisbury MD   On: 05/03/2020 22:48   ECHOCARDIOGRAM COMPLETE  Result Date: 05/04/2020    ECHOCARDIOGRAM REPORT    Patient Name:   JOYLENE WESCOTT [facility] Date of Exam: 05/04/2020 Medical Rec #:  [id number]       Height:       64.0 in Accession #:    [id number]      Weight:       139.1 lb Date of Birth:  [birth year]       BSA:          1.677 m Patient Age:    74 years        BP:           115/38 mmHg Patient Gender: F               HR:           80 bpm. Exam Location:  Inpatient Procedure: 2D Echo Indications:    Pulmonary Hypertension I27.2  History:        Patient has prior history of Echocardiogram examinations, most                 recent 12/01/2017. CAD, Prior CABG; Risk Factors:Diabetes,                 Hypertension and Dyslipidemia.  Sonographer:    Mikki Santee RDCS (AE) Referring Phys: [id number] Lequita Halt IMPRESSIONS  1. Left ventricular ejection fraction, by estimation, is 50 to 55%. The left ventricle has low normal function. The left ventricle demonstrates regional wall motion abnormalities (see scoring diagram/findings for description). Left ventricular diastolic  parameters are consistent with Grade II diastolic dysfunction (pseudonormalization). Elevated left ventricular end-diastolic pressure. There is incoordinate  septal motion.  2. Right ventricular systolic function is mildly reduced. The right ventricular size is normal. There is severely elevated pulmonary artery systolic pressure. The estimated right ventricular systolic pressure is 85.8 mmHg.  3. Left atrial size was severely dilated.  4. Right atrial size was mildly dilated.  5. The mitral valve is abnormal. Severe mitral valve regurgitation.  6. Tricuspid valve regurgitation is moderate to severe.  7. The aortic valve is tricuspid. Aortic valve regurgitation is not visualized. Mild aortic valve sclerosis is present, with no evidence of aortic valve stenosis.  8. The inferior vena cava is dilated in size with <50% respiratory variability, suggesting right atrial pressure of 15 mmHg. Comparison(s): Changes from prior study are noted. 11/27/2017: LVEF 50-55%,  moderate MR, moderate TR, mild LAE, RVSP not reported. FINDINGS  Left Ventricle: Left ventricular ejection fraction, by estimation, is 50 to 55%. The left ventricle has low normal function. The left ventricle demonstrates regional wall motion abnormalities. The left ventricular internal cavity size was normal in size. There is borderline left ventricular hypertrophy. Incoordinate septal motion. Left ventricular diastolic parameters are consistent with Grade II diastolic dysfunction (pseudonormalization). Elevated left ventricular end-diastolic pressure. Right Ventricle: The right ventricular size is normal. No increase in right ventricular wall thickness. Right ventricular systolic function is mildly reduced. There is severely elevated pulmonary artery systolic pressure. The tricuspid regurgitant velocity is 3.37 m/s, and with an assumed right atrial pressure of 15 mmHg, the estimated right ventricular systolic pressure is 85.0 mmHg. Left Atrium: Left atrial size was severely dilated. Right Atrium: Right atrial size was mildly dilated. Pericardium: There is no evidence of pericardial effusion. Mitral Valve: The mitral valve is abnormal. There is mild thickening of the mitral valve leaflet(s). There is moderate calcification of the posterior mitral valve leaflet(s). Mild to moderate mitral annular calcification. Severe mitral valve regurgitation, with centrally-directed jet. MV peak gradient, 13.1 mmHg. The mean mitral valve gradient is 7.0 mmHg. Tricuspid Valve: The tricuspid valve is grossly normal. Tricuspid valve regurgitation is moderate to severe. Aortic Valve: The aortic valve is tricuspid. Aortic valve regurgitation is not visualized. Mild aortic valve sclerosis is present, with no evidence of aortic valve stenosis. Pulmonic Valve: The pulmonic valve was grossly normal. Pulmonic valve regurgitation is trivial. Aorta: The aortic root and ascending aorta are structurally normal, with no evidence of dilitation.  Venous: The inferior vena cava is dilated in size with less than 50% respiratory variability, suggesting right atrial pressure of 15 mmHg. IAS/Shunts: No atrial level shunt detected by color flow Doppler.  LEFT VENTRICLE PLAX 2D LVIDd:         4.60 cm  Diastology LVIDs:         3.90 cm  LV e' medial:    2.55 cm/s LV PW:         1.00 cm  LV E/e' medial:  65.9 LV IVS:        1.00 cm  LV e' lateral:   3.38 cm/s LVOT diam:     1.80 cm  LV E/e' lateral: 49.7 LV SV:         49 LV SV Index:   29 LVOT Area:     2.54 cm  RIGHT VENTRICLE RV S prime:     8.10 cm/s TAPSE (M-mode): 1.3 cm LEFT ATRIUM              Index       RIGHT ATRIUM           Index  LA diam:        4.70 cm  2.80 cm/m  RA Area:     17.40 cm LA Vol (A2C):   128.0 ml 76.34 ml/m RA Volume:   42.30 ml  25.23 ml/m LA Vol (A4C):   83.5 ml  49.80 ml/m LA Biplane Vol: 107.0 ml 63.82 ml/m  AORTIC VALVE LVOT Vmax:   82.00 cm/s LVOT Vmean:  60.700 cm/s LVOT VTI:    0.193 m  AORTA Ao Root diam: 2.60 cm MITRAL VALVE                 TRICUSPID VALVE MV Area (PHT): 5.66 cm      TR Peak grad:   45.4 mmHg MV Area VTI:   1.32 cm      TR Vmax:        337.00 cm/s MV Peak grad:  13.1 mmHg MV Mean grad:  7.0 mmHg      SHUNTS MV Vmax:       1.81 m/s      Systemic VTI:  0.19 m MV Vmean:      129.0 cm/s    Systemic Diam: 1.80 cm MV Decel Time: 134 msec MR Peak grad:    122.5 mmHg MR Mean grad:    78.5 mmHg MR Vmax:         553.50 cm/s MR Vmean:        417.5 cm/s MR PISA:         5.09 cm MR PISA Eff ROA: 37 mm MR PISA Radius:  0.90 cm MV E velocity: 168.00 cm/s MV A velocity: 128.00 cm/s MV E/A ratio:  1.31 Lyman Bishop MD Electronically signed by Lyman Bishop MD Signature Date/Time: 05/04/2020/10:20:15 AM    Final    Medications: . ceFEPime (MAXIPIME) IV Stopped (05/03/20 1846)   .  stroke: mapping our early stages of recovery book   Does not apply Once  . allopurinol  100 mg Oral BID  . aspirin  300 mg Rectal Daily   Or  . aspirin  325 mg Oral Daily  .  atorvastatin  10 mg Oral Daily  . Chlorhexidine Gluconate Cloth  6 each Topical Q0600  . cinacalcet  30 mg Oral Q supper  . collagenase   Topical Daily  . darbepoetin (ARANESP) injection - DIALYSIS  60 mcg Intravenous Q Thu-HD  . docusate sodium  100 mg Oral BID  . feeding supplement (NEPRO CARB STEADY)   Oral BID  . heparin  5,000 Units Subcutaneous Q8H  . hydrALAZINE  50 mg Oral TID  . isosorbide dinitrate  10 mg Oral TID  . multivitamin  1 tablet Oral QHS  . pantoprazole  40 mg Oral Daily  . sevelamer carbonate  1,600 mg Oral TID WC     Dialysis Orders: NWon TTS KKX38.1 kgHD Bath 2K 2Ca 3 hours 45 minutes,no heparin. AccessRUA AVF Mircera 30 MCG every 2 weeks last on 01/13 units IV/HD Venofer100 mg loading needs 9 more doses  Assessment/Plan:  1. 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 S/pinitial debridement1/17, thenwashout 1/18 by Dr. Donzetta Matters.Per [facility].On Cefepime.May need lower dose of pain meds at least prior to dialysis to avoid hypotension. BP better today.  2. ESRD: Continue HD per usual TTS schedule  3. Hypertension/volume-Antihypertensivemeds on hold.Does not appear grossly volume overloaded. Unable to obtain UF last HD due to soft BP, trying small UF goal today 4. Anemiaof CKD: Hgb9.2- improving,continue Aranespq Thursday (next dose1/27). HoldingIViron load with  infection. 5. Metabolic bone disease -Corrected calcium within goal, phos slightly elevated.continue Renvela binder, Sensipar 30 daily 6. Nutrition - renal diet with fluid restrictions, vitamins, Nepro 7. History of CAD 8. History of systolic/diastolic heart failure  Charma Mocarski H. Loda Bialas NP-C 05/04/2020, 10:44 AM  [employer] [phone]

## 2020-05-04 NOTE — Progress Notes (Signed)
Carotid US completed    Please see CV Proc for preliminary results.   Daryle Amis, RVT  

## 2020-05-05 ENCOUNTER — Inpatient Hospital Stay (HOSPITAL_COMMUNITY): Payer: Medicare Other

## 2020-05-05 DIAGNOSIS — I639 Cerebral infarction, unspecified: Secondary | ICD-10-CM | POA: Diagnosis not present

## 2020-05-05 DIAGNOSIS — G9341 Metabolic encephalopathy: Secondary | ICD-10-CM | POA: Diagnosis not present

## 2020-05-05 DIAGNOSIS — R404 Transient alteration of awareness: Secondary | ICD-10-CM | POA: Diagnosis not present

## 2020-05-05 DIAGNOSIS — R69 Illness, unspecified: Secondary | ICD-10-CM | POA: Diagnosis not present

## 2020-05-05 LAB — GLUCOSE, CAPILLARY
Glucose-Capillary: 75 mg/dL (ref 70–99)
Glucose-Capillary: 76 mg/dL (ref 70–99)
Glucose-Capillary: 82 mg/dL (ref 70–99)
Glucose-Capillary: 87 mg/dL (ref 70–99)

## 2020-05-05 LAB — LIPID PANEL
Cholesterol: 99 mg/dL (ref 0–200)
HDL: 32 mg/dL — ABNORMAL LOW (ref >40)
LDL Cholesterol: 47 mg/dL (ref 0–99)
Total CHOL/HDL Ratio: 3.1 RATIO
Triglycerides: 102 mg/dL (ref <150)
VLDL: 20 mg/dL (ref 0–40)

## 2020-05-05 LAB — RENAL FUNCTION PANEL
Albumin: 2 g/dL — ABNORMAL LOW (ref 3.5–5.0)
Anion gap: 15 (ref 5–15)
BUN: 58 mg/dL — ABNORMAL HIGH (ref 8–23)
CO2: 20 mmol/L — ABNORMAL LOW (ref 22–32)
Calcium: 7.8 mg/dL — ABNORMAL LOW (ref 8.9–10.3)
Chloride: 99 mmol/L (ref 98–111)
Creatinine, Ser: 6.8 mg/dL — ABNORMAL HIGH (ref 0.44–1.00)
GFR, Estimated: 6 mL/min — ABNORMAL LOW (ref >60)
Glucose, Bld: 95 mg/dL (ref 70–99)
Phosphorus: 6.3 mg/dL — ABNORMAL HIGH (ref 2.5–4.6)
Potassium: 4.7 mmol/L (ref 3.5–5.1)
Sodium: 134 mmol/L — ABNORMAL LOW (ref 135–145)

## 2020-05-05 LAB — CBC
HCT: 30.1 % — ABNORMAL LOW (ref 36.0–46.0)
Hemoglobin: 9.8 g/dL — ABNORMAL LOW (ref 12.0–15.0)
MCH: 32 pg (ref 26.0–34.0)
MCHC: 32.6 g/dL (ref 30.0–36.0)
MCV: 98.4 fL (ref 80.0–100.0)
Platelets: 181 10*3/uL (ref 150–400)
RBC: 3.06 MIL/uL — ABNORMAL LOW (ref 3.87–5.11)
RDW: 20.3 % — ABNORMAL HIGH (ref 11.5–15.5)
WBC: 9 10*3/uL (ref 4.0–10.5)
nRBC: 0.7 % — ABNORMAL HIGH (ref 0.0–0.2)

## 2020-05-05 LAB — HEMOGLOBIN A1C
Hgb A1c MFr Bld: 4.9 % (ref 4.8–5.6)
Mean Plasma Glucose: 93.93 mg/dL

## 2020-05-05 MED ORDER — LIDOCAINE-PRILOCAINE 2.5-2.5 % EX CREA: 1.0000 "application " | TOPICAL_CREAM | CUTANEOUS | Status: DC | PRN

## 2020-05-05 MED ORDER — LIDOCAINE HCL (PF) 1 % IJ SOLN: 5.0000 mL | INTRAMUSCULAR | Status: DC | PRN

## 2020-05-05 MED ORDER — HEPARIN SODIUM (PORCINE) 1000 UNIT/ML DIALYSIS: 1000.0000 [IU] | INTRAMUSCULAR | Status: DC | PRN

## 2020-05-05 MED ORDER — PENTAFLUOROPROP-TETRAFLUOROETH EX AERO: 1.0000 "application " | INHALATION_SPRAY | CUTANEOUS | Status: DC | PRN

## 2020-05-05 MED ORDER — ALTEPLASE 2 MG IJ SOLR: 2.0000 mg | Freq: Once | INTRAMUSCULAR | Status: DC | PRN

## 2020-05-05 MED ORDER — SODIUM CHLORIDE 0.9 % IV SOLN: 100.0000 mL | INTRAVENOUS | Status: DC | PRN

## 2020-05-05 MED ORDER — ASPIRIN EC 81 MG PO TBEC
81.0000 mg | DELAYED_RELEASE_TABLET | Freq: Every day | ORAL | Status: DC
  Administered 2020-05-07 – 2020-05-11 (×5): 81 mg via ORAL
  Filled 2020-05-05 (×6): qty 1

## 2020-05-05 NOTE — Progress Notes (Signed)
OT Cancellation Note  Patient Details Name: [name] MRN: [id number] DOB: May 05, 1945   Cancelled Treatment:    Reason Eval/Treat Not Completed: Other (comment).  MRI pending ? Acute CVA.  OT to await pending scan and continue as appropriate.    [name] D Aizen Duval 05/05/2020, 11:53 AM

## 2020-05-05 NOTE — Evaluation (Addendum)
Clinical/Bedside Swallow Evaluation Patient Details  Name: [name] MRN: [id number] Date of Birth: 08/30/1945  Today's Date: 05/05/2020 Time: SLP Start Time (ACUTE ONLY): 74 SLP Stop Time (ACUTE ONLY): 1255 SLP Time Calculation (min) (ACUTE ONLY): 19 min  Past Medical History:  Past Medical History:  Diagnosis Date  . Allergy   . Anterior dislocation of left shoulder 05/27/2019  . Arthritis    "maybe in my lower back" (08/27/2012)  . 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 Chronic combined systolic and diastolic CHF (congestive heart failure) ([place])   . CKD (chronic kidney disease), stage IV ([place])   . Coronary artery disease    a. NSTEMI in 05/2009 s/p CABG (LIMA-LAD, SVG-diagonal, SVG-OM1/OM 2).   Marland Kitchen Dyspnea    with exertion  . Family history of anesthesia complication    Mom has a hard time to wake up  . Foot ulcer ([place])    "I've had them on both feet" (08/27/2012)  . Gouty arthritis    "right index finger" (08/27/2012)  . History of radiation therapy 06/09/13-07/06/13   left breast 50Gy  . Hyperlipidemia   . Hypertension   . Infection    right second toe  . Ischemic cardiomyopathy    a. 2011: EF 40-45%. b. EF 55-60% in 04/2017 but shortly after as inpatient was 45-50%.  Marland Kitchen LBBB (left bundle branch block)   . Mitral regurgitation   . Neuropathy    Hx; of B/L feet  . NSTEMI (non-ST elevated myocardial infarction) ([place]) 05/23/2009  . Osteomyelitis of foot ([place])   . PAD (peripheral artery disease) ([facility])    a. LE PAD (patient previously elected hold off L fem-pop), prev followed by Dr. Bridgett Larsson  . PONV (postoperative nausea and vomiting)   . Sinus headache    "occasionally" (08/27/2012)  . Type II diabetes mellitus ([place])   . Vitamin D deficiency    Past Surgical History:  Past Surgical History:  Procedure Laterality Date  . ABDOMINAL AORTOGRAM W/LOWER EXTREMITY Bilateral 03/25/2019   Procedure: ABDOMINAL AORTOGRAM W/LOWER  EXTREMITY;  Surgeon: Marty Heck, MD;  Location: [facility];  Service: Cardiovascular;  Laterality: Bilateral;  . ABDOMINAL AORTOGRAM W/LOWER EXTREMITY Bilateral 02/24/2020   Procedure: ABDOMINAL AORTOGRAM W/LOWER EXTREMITY;  Surgeon: Cherre Robins, MD;  Location: [facility];  Service: Cardiovascular;  Laterality: Bilateral;  . AMPUTATION Right 08/27/2012   Procedure: AMPUTATION RAY;  Surgeon: Newt Minion, MD;  Location: [facility];  Service: Orthopedics;  Laterality: Right;  Right Foot 5th Ray Amputation  . AMPUTATION Left 07/08/2013   Procedure: AMPUTATION RAY;  Surgeon: Newt Minion, MD;  Location: [facility];  Service: Orthopedics;  Laterality: Left;  Left Foot 2nd Ray Amputation  . AMPUTATION Left 04/06/2019   Procedure: LEFT 5TH RAY AMPUTATION;  Surgeon: Newt Minion, MD;  Location: [facility];  Service: Orthopedics;  Laterality: Left;  . AMPUTATION RAY Right 08/27/2012   5th ray/notes 08/27/2012  . AMPUTATION TOE Right 03/06/2017   Procedure: AMPUTATION TOE, INTERPHANGEAL 2ND RIGHT;  Surgeon: [name], DPM;  Location: [facility];  Service: Podiatry;  Laterality: Right;  . AV FISTULA PLACEMENT Right 11/11/2017   Procedure: RIGHT RADIOCEPHALIC  ARTERIOVENOUS FISTULA;  Surgeon: [name], MD;  Location: [facility];  Service: Vascular;  Laterality: Right;  . BREAST LUMPECTOMY  04/20/2013   with biopsy      DR WAKEFIELD  . BREAST LUMPECTOMY WITH NEEDLE LOCALIZATION AND  AXILLARY SENTINEL LYMPH NODE BX Left 04/20/2013   Procedure: LEFT BREAST WIRE GUIDED LUMPECTOMY AND AXILLARY SENTINEL LYMPH NODE BX;  Surgeon: Rolm Bookbinder, MD;  Location: [facility];  Service: General;  Laterality: Left;  . CARDIAC CATHETERIZATION  05/24/2009   Archie Endo 05/24/2009 (08/27/2012)  . CATARACT EXTRACTION W/ INTRAOCULAR LENS  IMPLANT, BILATERAL  2000's  . COLONOSCOPY W/ BIOPSIES AND POLYPECTOMY  2010  . CORONARY ARTERY BYPASS GRAFT  2011   "CABG X4" (08/27/2012)  . DENTAL SURGERY  04/30/11   "1 implant"  (08/27/2012)  . DILATION AND CURETTAGE OF UTERUS    . ENDARTERECTOMY FEMORAL Left 03/28/2019   Procedure: ENDARTERECTOMY FEMORAL;  Surgeon: [name], MD;  Location: [facility];  Service: Vascular;  Laterality: Left;  . ENDARTERECTOMY FEMORAL Right 03/16/2020   Procedure: RIGHT EXTERNAL ILIAC  ENDARTERECTOMY WITH PATCH ANGIOPLASTY;  Surgeon: [name], MD;  Location: [facility];  Service: Vascular;  Laterality: Right;  . EYE SURGERY    . FEMORAL-POPLITEAL BYPASS GRAFT Left 03/28/2019   Procedure: BYPASS GRAFT FEMORAL-POPLITEAL ARTERY;  Surgeon: [name], MD;  Location: [facility];  Service: Vascular;  Laterality: Left;  . FEMORAL-POPLITEAL BYPASS GRAFT Right 03/16/2020   Procedure: RIGHT COMMON FEMORAL TO ANTERIOR TIBIAL ARTERY BYPASS GRAFT USING PROPATEN GRAFT;  Surgeon: [name], MD;  Location: [facility];  Service: Vascular;  Laterality: Right;  . FINGER SURGERY Right    "index finger; turned out to be gout" (08/27/2012)  . GROIN DEBRIDEMENT Right 04/08/2020   Procedure: GROIN DEBRIDEMENT RIGHT;  Surgeon: Waynetta Sandy, MD;  Location: [facility];  Service: Vascular;  Laterality: Right;  . INCISION AND DRAINAGE OF WOUND Right 04/08/2020   Procedure: IRRIGATION AND DEBRIDEMENT, GROIN WOUND;  Surgeon: Waynetta Sandy, MD;  Location: [facility];  Service: Vascular;  Laterality: Right;  . IR FLUORO GUIDE CV LINE RIGHT  11/30/2017  . PATCH ANGIOPLASTY Left 03/28/2019   Procedure: Patch Angioplasty;  Surgeon: [name], MD;  Location: [facility];  Service: Vascular;  Laterality: Left;  . SHOULDER CLOSED REDUCTION Left 05/27/2019   Procedure: CLOSED REDUCTION LEFT SHOULDER;  Surgeon: Mcarthur Rossetti, MD;  Location: [facility];  Service: Orthopedics;  Laterality: Left;  . TEE WITHOUT CARDIOVERSION N/A 12/01/2017   Procedure: TRANSESOPHAGEAL ECHOCARDIOGRAM (TEE);  Surgeon: Lelon Perla, MD;  [facility];  Service: Cardiovascular;  Laterality: N/A;   HPI:  Patient is a 75 y/o  female who presents from SNF due to infected groin wound now s/p I&D right groin wound 1/17, s/p washout of wound and Right sartorius muscle flap coverage and wound vac application 9/70/26. Pt becam increasingly sleepy since 1/28 and question of acute CVA or acute metabolic encephalopathy. CT 1/27 no acute process, remote basal ganglia, left cerebellar lacunar insult. Significantly weak on right side and facial droop. Per chart, intake has been low. CXR 1/27 No superimposed focal pulmonary infiltrate; bilateral pleural effusions. PMH includes Lft fem to below knee pop bypass with saphenous vein, toe amputations, breast ca, left shoulder dislocation, CKD, CAD, STEMI, OA.   Assessment / Plan / Recommendation Clinical Impression  Pt's oropharyngeal swallow from subjective means was not overly worrisome for aspiration. There was mildly effortful and conscious timing of her inhalations and exhalations with swallows. Most swallows followed by slight inhalation although she was not dyspenic and was on room air.  From bedside perspective she appeard to be effectively protective of her airway. She had cognitive difficulty that could affect her intake given  decreased attention, processing and overall awareness although she consistently masticated solid texture, spontaneously checking for pocketing. No s/s aspiration with solid or thin. Return to regular texture, thin liquids with assist when needed is recommended. No further ST needed. SLP Visit Diagnosis: Dysphagia, unspecified (R13.10)   Speech-language-cognitive assessment ordered when stroke suspected. CT was negative and MRI ordered. Will defer this evaluation. Please reorder if needed in the future.     Aspiration Risk  Mild aspiration risk    Diet Recommendation Regular;Thin liquid   Liquid Administration via: Straw;Cup Medication Administration: Whole meds with liquid Supervision: Patient able to self feed;Full supervision/cueing for compensatory  strategies Compensations: Minimize environmental distractions;Slow rate;Small sips/bites Postural Changes: Seated upright at 90 degrees    Other  Recommendations Oral Care Recommendations: Oral care BID   Follow up Recommendations None      Frequency and Duration            Prognosis        Swallow Study   General HPI: Patient is a 75 y/o female who presents from SNF due to infected groin wound now s/p I&D right groin wound 1/17, s/p washout of wound and Right sartorius muscle flap coverage and wound vac application 04/21/70. Pt becam increasingly sleepy since 1/28 and question of acute CVA or acute metabolic encephalopathy. CT 1/27 no acute process, remote basal ganglia, left cerebellar lacunar insult. Significantly weak on right side and facial droop. Per chart, intake has been low. CXR 1/27 No superimposed focal pulmonary infiltrate; bilateral pleural effusions. PMH includes Lft fem to below knee pop bypass with saphenous vein, toe amputations, breast ca, left shoulder dislocation, CKD, CAD, STEMI, OA. Type of Study: Bedside Swallow Evaluation Previous Swallow Assessment:  (none) Diet Prior to this Study: NPO Temperature Spikes Noted: No Respiratory Status: Room air History of Recent Intubation: No Behavior/Cognition: Alert;Cooperative;Pleasant mood;Confused;Distractible;Requires cueing Oral Cavity Assessment: Within Functional Limits Oral Care Completed by SLP: No Oral Cavity - Dentition: Adequate natural dentition Vision: Functional for self-feeding Self-Feeding Abilities: Needs assist Patient Positioning: Upright in bed Baseline Vocal Quality: Normal Volitional Cough: Strong Volitional Swallow: Able to elicit    Oral/Motor/Sensory Function Overall Oral Motor/Sensory Function: Within functional limits   Ice Chips Ice chips: Not tested   Thin Liquid Thin Liquid: Within functional limits Presentation: Cup;Straw Pharyngeal  Phase Impairments:  (decr coordination with  respiration)    Nectar Thick Nectar Thick Liquid: Not tested   Honey Thick Honey Thick Liquid: Not tested   Puree Puree: Within functional limits Presentation: Spoon   Solid     Solid: Within functional limits      Houston Siren 05/05/2020,2:43 PM [name] Colvin Caroli.Ed Risk analyst [phone] Office [phone]

## 2020-05-05 NOTE — Progress Notes (Addendum)
Progress Note    05/05/2020 6:38 AM 11 Days Post-Op  Subjective:  Wants to know if they are still going to do the procedure this morning.   Afebrile   Vitals:   05/05/20 0000 05/05/20 0500  BP: (!) 114/40 (!) 119/42  Pulse: 82 82  Resp: 19 (!) 23  Temp: 97.7 F (36.5 C) 97.7 F (36.5 C)  SpO2: 98% 99%    Physical Exam: General:  Resting comforably Lungs:  Non labored on RA Incisions:  Right groin with wound vac in place with good seal Extremities:  Right foot is warm    CBC    Component Value Date/Time   WBC 9.0 05/05/2020 0208   RBC 3.06 (L) 05/05/2020 0208   HGB 9.8 (L) 05/05/2020 0208   HGB 11.7 05/18/2017 1218   HGB 12.3 02/02/2017 1136   HCT 30.1 (L) 05/05/2020 0208   HCT 34.7 05/18/2017 1218   HCT 37.4 02/02/2017 1136   PLT 181 05/05/2020 0208   PLT 227 05/18/2017 1218   MCV 98.4 05/05/2020 0208   MCV 87 05/18/2017 1218   MCV 90.6 02/02/2017 1136   MCH 32.0 05/05/2020 0208   MCHC 32.6 05/05/2020 0208   RDW 20.3 (H) 05/05/2020 0208   RDW 16.0 (H) 05/18/2017 1218   RDW 15.7 (H) 02/02/2017 1136   LYMPHSABS 1.1 04/14/2020 2141   LYMPHSABS 1.8 02/02/2017 1136   MONOABS 0.7 04/27/2020 2141   MONOABS 0.8 02/02/2017 1136   EOSABS 0.1 04/24/2020 2141   EOSABS 0.3 02/02/2017 1136   BASOSABS 0.0 05/05/2020 2141   BASOSABS 0.1 02/02/2017 1136    BMET    Component Value Date/Time   NA 137 05/03/2020 1025   NA 143 05/18/2017 1218   NA 143 02/02/2017 1137   K 4.9 05/03/2020 1025   K 3.8 02/02/2017 1137   CL 100 05/03/2020 1025   CO2 20 (L) 05/03/2020 1025   CO2 24 02/02/2017 1137   GLUCOSE 84 05/03/2020 1025   GLUCOSE 74 02/02/2017 1137   BUN 64 (H) 05/03/2020 1025   BUN 76 (HH) 05/18/2017 1218   BUN 63.4 (H) 02/02/2017 1137   CREATININE 7.12 (H) 05/03/2020 1025   CREATININE 5.12 (H) 02/13/2020 1518   CREATININE 3.0 (HH) 02/02/2017 1137   CALCIUM 7.9 (L) 05/03/2020 1025   CALCIUM 9.9 02/02/2017 1137   GFRNONAA 6 (L) 05/03/2020 1025    GFRNONAA 8 (L) 02/13/2020 1518   GFRAA 9 (L) 02/13/2020 1518    INR    Component Value Date/Time   INR 1.6 (H) 05/03/2020 2141     Intake/Output Summary (Last 24 hours) at 05/05/2020 4944 Last data filed at 05/05/2020 0500 Gross per 24 hour  Intake --  Output 40 ml  Net -40 ml   Carotid duplex 05/04/2020: Bilateral ICA stenosis 1-39% Vertebrals: Bilateral vertebral arteries demonstrate antegrade flow.  Subclavians: Normal flow hemodynamics were seen in bilateral subclavian  arteries.   2D echocardiogram 05/04/2020: 1. Left ventricular ejection fraction, by estimation, is 50 to 55%. The  left ventricle has low normal function. The left ventricle demonstrates  regional wall motion abnormalities (see scoring diagram/findings for  description). Left ventricular diastolic  parameters are consistent with Grade II diastolic dysfunction  (pseudonormalization). Elevated left ventricular end-diastolic pressure.  There is incoordinate septal motion.  2. Right ventricular systolic function is mildly reduced. The right  ventricular size is normal. There is severely elevated pulmonary artery  systolic pressure. The estimated right ventricular systolic pressure is  96.7 mmHg.  3. Left atrial size was severely dilated.  4. Right atrial size was mildly dilated.  5. The mitral valve is abnormal. Severe mitral valve regurgitation.  6. Tricuspid valve regurgitation is moderate to severe.  7. The aortic valve is tricuspid. Aortic valve regurgitation is not  visualized. Mild aortic valve sclerosis is present, with no evidence of  aortic valve stenosis.  8. The inferior vena cava is dilated in size with <50% respiratory  variability, suggesting right atrial pressure of 15 mmHg.   Assessment:  75 y.o. female is s/p:  POD 10 rightgroin sartorius muscle flapsecondary to right groin wound infection.She is now POD 12 from initial wash-out and debridement.   She is seven weeks post-op  right EIA and rightcommon femoral endarterectomy with Dacron patch angioplasty, right femoral to anterior tibial bypass with 6 mm ringed propatent Gore-Tex graft.  Plan: -wound vac change again on Monday-pt afebrile and no leukocytosis -hgb improving -bilateral hand grips equal this am. -continue to float right foot -DVT prophylaxis:  Sq heparin   Leontine Locket, PA-C [facility] [phone] 05/05/2020 6:38 AM  VASCULAR STAFF ADDENDUM: I agree with the above.   Yevonne Aline. Stanford Breed, MD [facility] Phone Number: [phone] 05/05/2020 11:14 PM

## 2020-05-05 NOTE — Progress Notes (Addendum)
[facility] Progress Note   Subjective: Seen prior to HD. Doesn't look well, not answering questions appropriately, only asking for what she needs. HD today on schedule. Minimal UF.   Objective Vitals:   05/04/20 1611 05/04/20 1944 05/05/20 0000 05/05/20 0500  BP: (!) 117/52 (!) 115/39 (!) 114/40 (!) 119/42  Pulse: 83 78 82 82  Resp: 16 15 19  (!) 23  Temp: 97.6 F (36.4 C) 97.7 F (36.5 C) 97.7 F (36.5 C) 97.7 F (36.5 C)  TempSrc: Oral Oral  Oral  SpO2: 99% 97% 98% 99%  Weight:       Physical Exam General: Ill appearing pleasant older female in NAD Heart: J0,Z0 2/6 systolic M.  Lungs: Bilateral breath sounds decreased in bases otherwise CTAB. No WOB.  Abdomen: S, NT, Active BS Extremities: No LE edema. Drsg R foot. Wound vac intact to R femoral wound.  Dialysis Access: R AVF + bruit    Additional Objective Labs: Basic Metabolic Panel: Recent Labs  Lab 04/28/20 1031 04/29/20 1426 05/03/20 1025  NA 132* 134* 137  K 4.8 4.7 4.9  CL 97* 99 100  CO2 18* 20* 20*  GLUCOSE 113* 83 84  BUN 52* 53* 64*  CREATININE 5.86* 6.16* 7.12*  CALCIUM 7.6* 7.4* 7.9*  PHOS 5.3* 5.6* 6.2*   Liver Function Tests: Recent Labs  Lab 04/28/20 1031 04/29/20 1426 05/03/20 1025  ALBUMIN 2.1* 1.9* 2.1*   No results for input(s): LIPASE, AMYLASE in the last 168 hours. CBC: Recent Labs  Lab 04/28/20 1031 05/02/20 0102 05/03/20 0242 05/04/20 0237 05/05/20 0208  WBC 11.0* 8.4 8.1 8.0 9.0  HGB 9.1* 8.9* 9.2* 9.1* 9.8*  HCT 27.1* 26.9* 28.1* 27.9* 30.1*  MCV 96.4 96.4 96.6 97.2 98.4  PLT 256 182 184 170 181   Blood Culture    Component Value Date/Time   SDES BLOOD THUMB 05/03/2020 2030   [place]  05/03/2020 2030    AEROBIC BOTTLE ONLY Blood Culture results may not be optimal due to an inadequate volume of blood received in culture bottles Performed at [facility] [address], [place], [place] [address]    CULT PENDING 05/03/2020 2030    REPTSTATUS PENDING 05/03/2020 2030    Cardiac Enzymes: No results for input(s): CKTOTAL, CKMB, CKMBINDEX, TROPONINI in the last 168 hours. CBG: Recent Labs  Lab 05/04/20 0651 05/04/20 1059 05/04/20 1612 05/04/20 2125 05/05/20 0526  GLUCAP 83 92 79 78 75   Iron Studies: No results for input(s): IRON, TIBC, TRANSFERRIN, FERRITIN in the last 72 hours. @lablastinr3 @ Studies/Results: DG Chest 1 View  Result Date: 05/03/2020 CLINICAL DATA:  Altered mental status EXAM: CHEST  1 VIEW COMPARISON:  04/08/2020 FINDINGS: Post CABG changes. Stable cardiomegaly. Densely calcified thoracic aorta. Moderate-sized right-sided pleural effusion, new from prior. Hazy opacity within the right mid to lower lung field. No left-sided pleural effusion. No pneumothorax. IMPRESSION: Moderate-sized right-sided pleural effusion with hazy opacity within the right mid to lower lung field, which may represent atelectasis and/or infiltrate. Electronically Signed   By: Davina Poke D.O.   On: 05/03/2020 16:45   CT HEAD WO CONTRAST  Result Date: 05/03/2020 CLINICAL DATA:  Mental status change, unknown cause EXAM: CT HEAD WITHOUT CONTRAST TECHNIQUE: Contiguous axial images were obtained from the base of the skull through the vertex without intravenous contrast. COMPARISON:  11/03/2017. FINDINGS: Brain: No acute infarct or intracranial hemorrhage. No mass lesion. No midline shift, ventriculomegaly or extra-axial fluid collection. Chronic bilateral basal ganglia and left  cerebellar lacunar insults. Mild cerebral atrophy with ex vacuo dilatation. Chronic microvascular ischemic changes. Vascular: No hyperdense vessel or unexpected calcification. Skull: Negative for fracture or focal lesion. Sinuses/Orbits: Normal orbits. Right maxillary sinus opacification with layering secretions. No mastoid effusion. Other: None. IMPRESSION: No acute intracranial process. Remote basal ganglia and left cerebellar lacunar insults. Mild cerebral  atrophy and chronic microvascular ischemic changes. Right maxillary sinus disease. Electronically Signed   By: Primitivo Gauze M.D.   On: 05/03/2020 21:53   CT CHEST WO CONTRAST  Result Date: 05/03/2020 CLINICAL DATA:  Pleural effusion EXAM: CT CHEST WITHOUT CONTRAST TECHNIQUE: Multidetector CT imaging of the chest was performed following the standard protocol without IV contrast. COMPARISON:  None. FINDINGS: Cardiovascular: Coronary artery bypass grafting has been performed. Extensive calcification of the mitral valve annulus. Mild global cardiomegaly. No pericardial effusion. The central pulmonary arteries are of normal caliber. Extensive atherosclerotic calcification within the thoracic aorta. Aberrant origin of the right subclavian artery. No aortic aneurysm. Mediastinum/Nodes: Multiple subcentimeter nodules within the thyroid gland result in heterogeneous enhancement. The gland is not enlarged. No pathologic thoracic adenopathy. The esophagus is unremarkable. Lungs/Pleura: Small to moderate left and large right pleural effusions are present. There is subtotal collapse of the a right lower lobe. Mild compressive atelectasis of the left lower lobe. No superimposed focal pulmonary infiltrate. No pneumothorax. Central airways are widely patent. Upper Abdomen: Extensive atherosclerotic calcification noted within the visualized abdominal aorta and visceral vasculature. No acute abnormality. Musculoskeletal: Osseous structures are age-appropriate. No acute bone abnormality. IMPRESSION: Large right pleural effusion and small to moderate left pleural effusion with associated bibasilar compressive atelectasis. There is subtotal collapse of the right lower lobe. No superimposed focal pulmonary infiltrate. Peripheral vascular disease. Aortic Atherosclerosis (ICD10-I70.0). Electronically Signed   By: Fidela Salisbury MD   On: 05/03/2020 22:48   ECHOCARDIOGRAM COMPLETE  Result Date: 05/04/2020    ECHOCARDIOGRAM  REPORT   Patient Name:   SHARLIE SHREFFLER [facility] Date of Exam: 05/04/2020 Medical Rec #:  [id number]       Height:       64.0 in Accession #:    [id number]      Weight:       139.1 lb Date of Birth:  Aug 04, 1945       BSA:          1.677 m Patient Age:    50 years        BP:           115/38 mmHg Patient Gender: F               HR:           80 bpm. Exam Location:  Inpatient Procedure: 2D Echo Indications:    Pulmonary Hypertension I27.2  History:        Patient has prior history of Echocardiogram examinations, most                 recent 12/01/2017. CAD, Prior CABG; Risk Factors:Diabetes,                 Hypertension and Dyslipidemia.  Sonographer:    Mikki Santee RDCS (AE) Referring Phys: [id number] Lequita Halt IMPRESSIONS  1. Left ventricular ejection fraction, by estimation, is 50 to 55%. The left ventricle has low normal function. The left ventricle demonstrates regional wall motion abnormalities (see scoring diagram/findings for description). Left ventricular diastolic  parameters are consistent with Grade II diastolic dysfunction (pseudonormalization). Elevated left ventricular end-diastolic pressure. There  is incoordinate septal motion.  2. Right ventricular systolic function is mildly reduced. The right ventricular size is normal. There is severely elevated pulmonary artery systolic pressure. The estimated right ventricular systolic pressure is 43.1 mmHg.  3. Left atrial size was severely dilated.  4. Right atrial size was mildly dilated.  5. The mitral valve is abnormal. Severe mitral valve regurgitation.  6. Tricuspid valve regurgitation is moderate to severe.  7. The aortic valve is tricuspid. Aortic valve regurgitation is not visualized. Mild aortic valve sclerosis is present, with no evidence of aortic valve stenosis.  8. The inferior vena cava is dilated in size with <50% respiratory variability, suggesting right atrial pressure of 15 mmHg. Comparison(s): Changes from prior study are noted. 11/27/2017: LVEF  50-55%, moderate MR, moderate TR, mild LAE, RVSP not reported. FINDINGS  Left Ventricle: Left ventricular ejection fraction, by estimation, is 50 to 55%. The left ventricle has low normal function. The left ventricle demonstrates regional wall motion abnormalities. The left ventricular internal cavity size was normal in size. There is borderline left ventricular hypertrophy. Incoordinate septal motion. Left ventricular diastolic parameters are consistent with Grade II diastolic dysfunction (pseudonormalization). Elevated left ventricular end-diastolic pressure. Right Ventricle: The right ventricular size is normal. No increase in right ventricular wall thickness. Right ventricular systolic function is mildly reduced. There is severely elevated pulmonary artery systolic pressure. The tricuspid regurgitant velocity is 3.37 m/s, and with an assumed right atrial pressure of 15 mmHg, the estimated right ventricular systolic pressure is 54.0 mmHg. Left Atrium: Left atrial size was severely dilated. Right Atrium: Right atrial size was mildly dilated. Pericardium: There is no evidence of pericardial effusion. Mitral Valve: The mitral valve is abnormal. There is mild thickening of the mitral valve leaflet(s). There is moderate calcification of the posterior mitral valve leaflet(s). Mild to moderate mitral annular calcification. Severe mitral valve regurgitation, with centrally-directed jet. MV peak gradient, 13.1 mmHg. The mean mitral valve gradient is 7.0 mmHg. Tricuspid Valve: The tricuspid valve is grossly normal. Tricuspid valve regurgitation is moderate to severe. Aortic Valve: The aortic valve is tricuspid. Aortic valve regurgitation is not visualized. Mild aortic valve sclerosis is present, with no evidence of aortic valve stenosis. Pulmonic Valve: The pulmonic valve was grossly normal. Pulmonic valve regurgitation is trivial. Aorta: The aortic root and ascending aorta are structurally normal, with no evidence of  dilitation. Venous: The inferior vena cava is dilated in size with less than 50% respiratory variability, suggesting right atrial pressure of 15 mmHg. IAS/Shunts: No atrial level shunt detected by color flow Doppler.  LEFT VENTRICLE PLAX 2D LVIDd:         4.60 cm  Diastology LVIDs:         3.90 cm  LV e' medial:    2.55 cm/s LV PW:         1.00 cm  LV E/e' medial:  65.9 LV IVS:        1.00 cm  LV e' lateral:   3.38 cm/s LVOT diam:     1.80 cm  LV E/e' lateral: 49.7 LV SV:         49 LV SV Index:   29 LVOT Area:     2.54 cm  RIGHT VENTRICLE RV S prime:     8.10 cm/s TAPSE (M-mode): 1.3 cm LEFT ATRIUM              Index       RIGHT ATRIUM  Index LA diam:        4.70 cm  2.80 cm/m  RA Area:     17.40 cm LA Vol (A2C):   128.0 ml 76.34 ml/m RA Volume:   42.30 ml  25.23 ml/m LA Vol (A4C):   83.5 ml  49.80 ml/m LA Biplane Vol: 107.0 ml 63.82 ml/m  AORTIC VALVE LVOT Vmax:   82.00 cm/s LVOT Vmean:  60.700 cm/s LVOT VTI:    0.193 m  AORTA Ao Root diam: 2.60 cm MITRAL VALVE                 TRICUSPID VALVE MV Area (PHT): 5.66 cm      TR Peak grad:   45.4 mmHg MV Area VTI:   1.32 cm      TR Vmax:        337.00 cm/s MV Peak grad:  13.1 mmHg MV Mean grad:  7.0 mmHg      SHUNTS MV Vmax:       1.81 m/s      Systemic VTI:  0.19 m MV Vmean:      129.0 cm/s    Systemic Diam: 1.80 cm MV Decel Time: 134 msec MR Peak grad:    122.5 mmHg MR Mean grad:    78.5 mmHg MR Vmax:         553.50 cm/s MR Vmean:        417.5 cm/s MR PISA:         5.09 cm MR PISA Eff ROA: 37 mm MR PISA Radius:  0.90 cm MV E velocity: 168.00 cm/s MV A velocity: 128.00 cm/s MV E/A ratio:  1.31 Lyman Bishop MD Electronically signed by Lyman Bishop MD Signature Date/Time: 05/04/2020/10:20:15 AM    Final    VAS US CAROTID (at [facility] and [facility] only)  Result Date: 05/04/2020 Carotid Arterial Duplex Study Indications:       Transient alteration of awareness. Risk Factors:      Hypertension, hyperlipidemia, Diabetes, coronary artery                     disease, PAD. Comparison Study:  Prev 11/2019 1-39% Performing Technologist: Vonzell Schlatter RVT  Examination Guidelines: A complete evaluation includes B-mode imaging, spectral Doppler, color Doppler, and power Doppler as needed of all accessible portions of each vessel. Bilateral testing is considered an integral part of a complete examination. Limited examinations for reoccurring indications may be performed as noted.  Right Carotid Findings: +----------+--------+--------+--------+------------------+--------+           PSV cm/sEDV cm/sStenosisPlaque DescriptionComments +----------+--------+--------+--------+------------------+--------+ CCA Prox  96      9                                          +----------+--------+--------+--------+------------------+--------+ CCA Distal74      12                                         +----------+--------+--------+--------+------------------+--------+ ICA Prox  136     15      1-39%   heterogenous               +----------+--------+--------+--------+------------------+--------+ ICA Distal112     20                                         +----------+--------+--------+--------+------------------+--------+  ECA       113                                                +----------+--------+--------+--------+------------------+--------+ +----------+--------+-------+--------+-------------------+           PSV cm/sEDV cmsDescribeArm Pressure (mmHG) +----------+--------+-------+--------+-------------------+ [id number]                                        +----------+--------+-------+--------+-------------------+  Left Carotid Findings: +----------+--------+--------+--------+------------------+--------+           PSV cm/sEDV cm/sStenosisPlaque DescriptionComments +----------+--------+--------+--------+------------------+--------+ CCA Prox  105                                                 +----------+--------+--------+--------+------------------+--------+ CCA Distal102     13                                         +----------+--------+--------+--------+------------------+--------+ ICA Prox  119     21      1-39%   heterogenous               +----------+--------+--------+--------+------------------+--------+ ICA Distal78      17                                         +----------+--------+--------+--------+------------------+--------+ ECA       165                                                +----------+--------+--------+--------+------------------+--------+ +----------+--------+--------+--------+-------------------+           PSV cm/sEDV cm/sDescribeArm Pressure (mmHG) +----------+--------+--------+--------+-------------------+ Subclavian209                                         +----------+--------+--------+--------+-------------------+ +---------+--------+--+--------+--+ VertebralPSV cm/s51EDV cm/s10 +---------+--------+--+--------+--+  Summary: Right Carotid: Velocities in the right ICA are consistent with a 1-39% stenosis. Left Carotid: Velocities in the left ICA are consistent with a 1-39% stenosis. Vertebrals:  Bilateral vertebral arteries demonstrate antegrade flow. Subclavians: Normal flow hemodynamics were seen in bilateral subclavian              arteries. *See table(s) above for measurements and observations.  Electronically signed by Jamelle Haring on 05/04/2020 at 5:17:12 PM.    Final    Medications: . sodium chloride    . sodium chloride    . ceFEPime (MAXIPIME) IV 1 g (05/04/20 1618)  . dextrose 5 % and 0.45% NaCl 50 mL/hr at 05/04/20 1848   . allopurinol  100 mg Oral BID  . aspirin  300 mg Rectal Daily   Or  . aspirin  325 mg Oral Daily  . atorvastatin  10 mg Oral Daily  . Chlorhexidine Gluconate Cloth  6 each Topical  [id number]  . cinacalcet  30 mg Oral Q supper  . collagenase   Topical Daily  . darbepoetin (ARANESP)  injection - DIALYSIS  60 mcg Intravenous Q Thu-HD  . docusate sodium  100 mg Oral BID  . feeding supplement (NEPRO CARB STEADY)   Oral BID  . heparin  5,000 Units Subcutaneous Q8H  . hydrALAZINE  50 mg Oral TID  . isosorbide dinitrate  10 mg Oral TID  . multivitamin  1 tablet Oral QHS  . pantoprazole  40 mg Oral Daily  . sevelamer carbonate  1,600 mg Oral TID WC     Dialysis Orders: NWon TTS QTT27.6 kgHD Bath 2K 2Ca 3 hours 45 minutes,no heparin. AccessRUA AVF Mircera 30 MCG every 2 weeks last on 01/13 units IV/HD Venofer100 mg loading needs 9 more doses  Assessment/Plan:  1. 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 S/pinitial debridement1/17, thenwashout 1/18 by Dr. Donzetta Matters.Per [facility]. Plan for wound vac change 05/07/20.  2. ESRD: Continue HD per usual TTS schedule.  3. Hypertension/volume-Antihypertensivemeds on hold.Does not appear grossly volume overloaded. UF as tolerated.  4. Anemiaof CKD: Hgb9.2- improving,continue Aranespq Thursday (next dose1/27). HoldingIViron load with infection. 5. Metabolic bone disease -Corrected calcium within goal, phos slightly elevated.continue Renvela binder, Sensipar 30 daily 6. Nutrition - renal diet with fluid restrictions, vitamins, Nepro 7. History of CAD 8. History of systolic/diastolic heart failure  Shaunae Sieloff H. Javan Gonzaga NP-C 05/05/2020, 8:35 AM  [employer] [phone]

## 2020-05-05 NOTE — Progress Notes (Signed)
[facility] CONSULTATION PROGRESS NOTE    Progress Note  [name]  UMP:[id number] DOB: 1946-02-15 DOA: 04/30/2020 PCP: Unk Pinto, MD     Brief Narrative:   [name] is an 75 y.o. female past medical history of end-stage renal disease on hemodialysis, status post right external iliac and common femoral enterectomy with right femoral to anterior tibial artery bypass insulin-dependent diabetes mellitus type 2, essential hypertension moderate pulmonary hypertension who came into the hospital for an infected right groin surgical wound on 04/23/2020 status post I&D and wound VAC started empirically on antibiotics by vascular surgery. We are consulted as over the last 2 days she became more sleepy with occasional confusion.  Assessment/Plan:   Acute metabolic encephalopathy/concern about acute CVA: She is significantly weak on the right side of her upper extremity about a 2 out of 5, lower extremity is about a 2 out of 5 2, she has some facial droop. HgbA1c, fasting lipid panel HDL greater than 40 LDL less than 70 continue Lipitor MRI, MRA of the brain without contrast pending Speech evaluation is pending. Physical therapy skilled nursing facility Carotid dopplers show no significant ICA stenosis bilaterally Transthoracic Echo preserved EF no wall motion abnormalities grade 2 diastolic heart failure.  Continue aspirin.  New onset right-sided pleural effusion: ED of the chest was done that showed a large right pleural effusion a small to moderate left pleural effusion with compressive hemoptysis, there is a subtotal collapse of the right lower lobe. She is currently asymptomatic.  Right groin surgical wound infection: Continue further management for vascular.  IV antibiotics per vascular.  Insulin-dependent diabetes mellitus type 2:  Oral hypoglycemic agents blood glucose stable. Hold all insulins.  History of pulmonary hypertension: The echo was ordered that  showed an EF of 40%, grade 2 diastolic heart failure Pressures are elevated at 60 mmHg bilateral atrial enlargement.  RN Pressure Injury Documentation: Pressure Injury 05/07/2020 Buttocks Right;Left;Mid Stage 1 -  Intact skin with non-blanchable redness of a localized area usually over a bony prominence. redden and tender to touch (Active)  04/29/2020 1930  Location: Buttocks  Location Orientation: Right;Left;Mid  Staging: Stage 1 -  Intact skin with non-blanchable redness of a localized area usually over a bony prominence.  Wound Description (Comments): redden and tender to touch  Present on Admission:     Estimated body mass index is 23.12 kg/m as calculated from the following:   Height as of 04/20/20: 5\' 4"  (1.626 m).   Weight as of this encounter: 61.1 kg.    DVT prophylaxis: Heparin Family Communication: None Status is: Inpatient  Remains inpatient appropriate because:Hemodynamically unstable   Dispo: The patient is from: SNF              Anticipated d/c is to: SNF              Anticipated d/c date is: 2 days              Patient currently is not medically stable to d/c.   Difficult to place patient No        Code Status:     Code Status Orders  (From admission, onward)         Start     Ordered   04/23/20 0754  Full code  Continuous        04/23/20 0753        Code Status History    Date Active Date Inactive Code Status Order ID Comments User  Context   03/16/2020 1404 03/27/2020 2121 Full Code [id number]  Iline Oven Inpatient   02/24/2020 1445 02/24/2020 2157 Full Code [id number]  Cherre Robins, MD Inpatient   04/07/2019 1420 04/20/2019 2040 Full Code [id number]  Elizabeth Sauer Inpatient   04/07/2019 1420 04/07/2019 1420 Full Code [id number]  Cathlyn Parsons, PA-C Inpatient   03/23/2019 1724 04/07/2019 1418 Full Code [id number]  Lequita Halt, MD ED   11/26/2017 1456 12/04/2017 1733 Full Code [id number]  Reyne Dumas, MD ED   11/03/2017  1122 11/05/2017 1814 DNR [id number]  Velna Ochs, MD ED   05/04/2017 0048 05/07/2017 1608 Full Code [id number]  Jani Gravel, MD Inpatient   06/04/2013 1656 06/08/2013 1954 Full Code [id number]  Barton Dubois, MD Inpatient   04/20/2013 1522 04/21/2013 1727 Full Code [id number]  Rolm Bookbinder, MD Inpatient   08/27/2012 1803 08/28/2012 1920 Full Code [id number]  Newt Minion, MD Inpatient   Advance Care Planning Activity        IV Access:    Peripheral IV   Procedures and diagnostic studies:   DG Chest 1 View  Result Date: 05/03/2020 CLINICAL DATA:  Altered mental status EXAM: CHEST  1 VIEW COMPARISON:  04/11/2020 FINDINGS: Post CABG changes. Stable cardiomegaly. Densely calcified thoracic aorta. Moderate-sized right-sided pleural effusion, new from prior. Hazy opacity within the right mid to lower lung field. No left-sided pleural effusion. No pneumothorax. IMPRESSION: Moderate-sized right-sided pleural effusion with hazy opacity within the right mid to lower lung field, which may represent atelectasis and/or infiltrate. Electronically Signed   By: Davina Poke D.O.   On: 05/03/2020 16:45   CT HEAD WO CONTRAST  Result Date: 05/03/2020 CLINICAL DATA:  Mental status change, unknown cause EXAM: CT HEAD WITHOUT CONTRAST TECHNIQUE: Contiguous axial images were obtained from the base of the skull through the vertex without intravenous contrast. COMPARISON:  11/03/2017. FINDINGS: Brain: No acute infarct or intracranial hemorrhage. No mass lesion. No midline shift, ventriculomegaly or extra-axial fluid collection. Chronic bilateral basal ganglia and left cerebellar lacunar insults. Mild cerebral atrophy with ex vacuo dilatation. Chronic microvascular ischemic changes. Vascular: No hyperdense vessel or unexpected calcification. Skull: Negative for fracture or focal lesion. Sinuses/Orbits: Normal orbits. Right maxillary sinus opacification with layering secretions. No mastoid effusion. Other: None.  IMPRESSION: No acute intracranial process. Remote basal ganglia and left cerebellar lacunar insults. Mild cerebral atrophy and chronic microvascular ischemic changes. Right maxillary sinus disease. Electronically Signed   By: Primitivo Gauze M.D.   On: 05/03/2020 21:53   CT CHEST WO CONTRAST  Result Date: 05/03/2020 CLINICAL DATA:  Pleural effusion EXAM: CT CHEST WITHOUT CONTRAST TECHNIQUE: Multidetector CT imaging of the chest was performed following the standard protocol without IV contrast. COMPARISON:  None. FINDINGS: Cardiovascular: Coronary artery bypass grafting has been performed. Extensive calcification of the mitral valve annulus. Mild global cardiomegaly. No pericardial effusion. The central pulmonary arteries are of normal caliber. Extensive atherosclerotic calcification within the thoracic aorta. Aberrant origin of the right subclavian artery. No aortic aneurysm. Mediastinum/Nodes: Multiple subcentimeter nodules within the thyroid gland result in heterogeneous enhancement. The gland is not enlarged. No pathologic thoracic adenopathy. The esophagus is unremarkable. Lungs/Pleura: Small to moderate left and large right pleural effusions are present. There is subtotal collapse of the a right lower lobe. Mild compressive atelectasis of the left lower lobe. No superimposed focal pulmonary infiltrate. No pneumothorax. Central airways are widely patent. Upper Abdomen: Extensive atherosclerotic calcification noted within the visualized abdominal aorta and visceral vasculature. No acute  abnormality. Musculoskeletal: Osseous structures are age-appropriate. No acute bone abnormality. IMPRESSION: Large right pleural effusion and small to moderate left pleural effusion with associated bibasilar compressive atelectasis. There is subtotal collapse of the right lower lobe. No superimposed focal pulmonary infiltrate. Peripheral vascular disease. Aortic Atherosclerosis (ICD10-I70.0). Electronically Signed   By:  Fidela Salisbury MD   On: 05/03/2020 22:48   ECHOCARDIOGRAM COMPLETE  Result Date: 05/04/2020    ECHOCARDIOGRAM REPORT   Patient Name:   [name] [facility] Date of Exam: 05/04/2020 Medical Rec #:  [id number]       Height:       64.0 in Accession #:    [id number]      Weight:       139.1 lb Date of Birth:  Jun 21, 1945       BSA:          1.677 m Patient Age:    16 years        BP:           115/38 mmHg Patient Gender: F               HR:           80 bpm. Exam Location:  Inpatient Procedure: 2D Echo Indications:    Pulmonary Hypertension I27.2  History:        Patient has prior history of Echocardiogram examinations, most                 recent 12/01/2017. CAD, Prior CABG; Risk Factors:Diabetes,                 Hypertension and Dyslipidemia.  Sonographer:    Mikki Santee RDCS (AE) Referring Phys: [id number] Lequita Halt IMPRESSIONS  1. Left ventricular ejection fraction, by estimation, is 50 to 55%. The left ventricle has low normal function. The left ventricle demonstrates regional wall motion abnormalities (see scoring diagram/findings for description). Left ventricular diastolic  parameters are consistent with Grade II diastolic dysfunction (pseudonormalization). Elevated left ventricular end-diastolic pressure. There is incoordinate septal motion.  2. Right ventricular systolic function is mildly reduced. The right ventricular size is normal. There is severely elevated pulmonary artery systolic pressure. The estimated right ventricular systolic pressure is 64.3 mmHg.  3. Left atrial size was severely dilated.  4. Right atrial size was mildly dilated.  5. The mitral valve is abnormal. Severe mitral valve regurgitation.  6. Tricuspid valve regurgitation is moderate to severe.  7. The aortic valve is tricuspid. Aortic valve regurgitation is not visualized. Mild aortic valve sclerosis is present, with no evidence of aortic valve stenosis.  8. The inferior vena cava is dilated in size with <50% respiratory variability,  suggesting right atrial pressure of 15 mmHg. Comparison(s): Changes from prior study are noted. 11/27/2017: LVEF 50-55%, moderate MR, moderate TR, mild LAE, RVSP not reported. FINDINGS  Left Ventricle: Left ventricular ejection fraction, by estimation, is 50 to 55%. The left ventricle has low normal function. The left ventricle demonstrates regional wall motion abnormalities. The left ventricular internal cavity size was normal in size. There is borderline left ventricular hypertrophy. Incoordinate septal motion. Left ventricular diastolic parameters are consistent with Grade II diastolic dysfunction (pseudonormalization). Elevated left ventricular end-diastolic pressure. Right Ventricle: The right ventricular size is normal. No increase in right ventricular wall thickness. Right ventricular systolic function is mildly reduced. There is severely elevated pulmonary artery systolic pressure. The tricuspid regurgitant velocity is 3.37 m/s, and with an assumed right atrial pressure of 15 mmHg, the  estimated right ventricular systolic pressure is 63.1 mmHg. Left Atrium: Left atrial size was severely dilated. Right Atrium: Right atrial size was mildly dilated. Pericardium: There is no evidence of pericardial effusion. Mitral Valve: The mitral valve is abnormal. There is mild thickening of the mitral valve leaflet(s). There is moderate calcification of the posterior mitral valve leaflet(s). Mild to moderate mitral annular calcification. Severe mitral valve regurgitation, with centrally-directed jet. MV peak gradient, 13.1 mmHg. The mean mitral valve gradient is 7.0 mmHg. Tricuspid Valve: The tricuspid valve is grossly normal. Tricuspid valve regurgitation is moderate to severe. Aortic Valve: The aortic valve is tricuspid. Aortic valve regurgitation is not visualized. Mild aortic valve sclerosis is present, with no evidence of aortic valve stenosis. Pulmonic Valve: The pulmonic valve was grossly normal. Pulmonic valve  regurgitation is trivial. Aorta: The aortic root and ascending aorta are structurally normal, with no evidence of dilitation. Venous: The inferior vena cava is dilated in size with less than 50% respiratory variability, suggesting right atrial pressure of 15 mmHg. IAS/Shunts: No atrial level shunt detected by color flow Doppler.  LEFT VENTRICLE PLAX 2D LVIDd:         4.60 cm  Diastology LVIDs:         3.90 cm  LV e' medial:    2.55 cm/s LV PW:         1.00 cm  LV E/e' medial:  65.9 LV IVS:        1.00 cm  LV e' lateral:   3.38 cm/s LVOT diam:     1.80 cm  LV E/e' lateral: 49.7 LV SV:         49 LV SV Index:   29 LVOT Area:     2.54 cm  RIGHT VENTRICLE RV S prime:     8.10 cm/s TAPSE (M-mode): 1.3 cm LEFT ATRIUM              Index       RIGHT ATRIUM           Index LA diam:        4.70 cm  2.80 cm/m  RA Area:     17.40 cm LA Vol (A2C):   128.0 ml 76.34 ml/m RA Volume:   42.30 ml  25.23 ml/m LA Vol (A4C):   83.5 ml  49.80 ml/m LA Biplane Vol: 107.0 ml 63.82 ml/m  AORTIC VALVE LVOT Vmax:   82.00 cm/s LVOT Vmean:  60.700 cm/s LVOT VTI:    0.193 m  AORTA Ao Root diam: 2.60 cm MITRAL VALVE                 TRICUSPID VALVE MV Area (PHT): 5.66 cm      TR Peak grad:   45.4 mmHg MV Area VTI:   1.32 cm      TR Vmax:        337.00 cm/s MV Peak grad:  13.1 mmHg MV Mean grad:  7.0 mmHg      SHUNTS MV Vmax:       1.81 m/s      Systemic VTI:  0.19 m MV Vmean:      129.0 cm/s    Systemic Diam: 1.80 cm MV Decel Time: 134 msec MR Peak grad:    122.5 mmHg MR Mean grad:    78.5 mmHg MR Vmax:         553.50 cm/s MR Vmean:        417.5 cm/s MR PISA:  5.09 cm MR PISA Eff ROA: 37 mm MR PISA Radius:  0.90 cm MV E velocity: 168.00 cm/s MV A velocity: 128.00 cm/s MV E/A ratio:  1.31 Lyman Bishop MD Electronically signed by Lyman Bishop MD Signature Date/Time: 05/04/2020/10:20:15 AM    Final    VAS US CAROTID (at [facility] and [facility] only)  Result Date: 05/04/2020 Carotid Arterial Duplex Study Indications:       Transient alteration  of awareness. Risk Factors:      Hypertension, hyperlipidemia, Diabetes, coronary artery                    disease, PAD. Comparison Study:  Prev 11/2019 1-39% Performing Technologist: Vonzell Schlatter RVT  Examination Guidelines: A complete evaluation includes B-mode imaging, spectral Doppler, color Doppler, and power Doppler as needed of all accessible portions of each vessel. Bilateral testing is considered an integral part of a complete examination. Limited examinations for reoccurring indications may be performed as noted.  Right Carotid Findings: +----------+--------+--------+--------+------------------+--------+           PSV cm/sEDV cm/sStenosisPlaque DescriptionComments +----------+--------+--------+--------+------------------+--------+ CCA Prox  96      9                                          +----------+--------+--------+--------+------------------+--------+ CCA Distal74      12                                         +----------+--------+--------+--------+------------------+--------+ ICA Prox  136     15      1-39%   heterogenous               +----------+--------+--------+--------+------------------+--------+ ICA Distal112     20                                         +----------+--------+--------+--------+------------------+--------+ ECA       113                                                +----------+--------+--------+--------+------------------+--------+ +----------+--------+-------+--------+-------------------+           PSV cm/sEDV cmsDescribeArm Pressure (mmHG) +----------+--------+-------+--------+-------------------+ [id number]                                        +----------+--------+-------+--------+-------------------+  Left Carotid Findings: +----------+--------+--------+--------+------------------+--------+           PSV cm/sEDV cm/sStenosisPlaque DescriptionComments  +----------+--------+--------+--------+------------------+--------+ CCA Prox  105                                                +----------+--------+--------+--------+------------------+--------+ CCA Distal102     13                                         +----------+--------+--------+--------+------------------+--------+  ICA Prox  119     21      1-39%   heterogenous               +----------+--------+--------+--------+------------------+--------+ ICA Distal78      17                                         +----------+--------+--------+--------+------------------+--------+ ECA       165                                                +----------+--------+--------+--------+------------------+--------+ +----------+--------+--------+--------+-------------------+           PSV cm/sEDV cm/sDescribeArm Pressure (mmHG) +----------+--------+--------+--------+-------------------+ Subclavian209                                         +----------+--------+--------+--------+-------------------+ +---------+--------+--+--------+--+ VertebralPSV cm/s51EDV cm/s10 +---------+--------+--+--------+--+  Summary: Right Carotid: Velocities in the right ICA are consistent with a 1-39% stenosis. Left Carotid: Velocities in the left ICA are consistent with a 1-39% stenosis. Vertebrals:  Bilateral vertebral arteries demonstrate antegrade flow. Subclavians: Normal flow hemodynamics were seen in bilateral subclavian              arteries. *See table(s) above for measurements and observations.  Electronically signed by Jamelle Haring on 05/04/2020 at 5:17:12 PM.    Final      Medical Consultants:    None.  Anti-Infectives:   Cefepime  Subjective:    [name] relates she still very weak.  Objective:    Vitals:   05/05/20 0000 05/05/20 0500 05/05/20 0805 05/05/20 0810  BP: (!) 114/40 (!) 119/42    Pulse: 82 82    Resp: 19 (!) 23    Temp: 97.7 F (36.5 C) 97.7  F (36.5 C)  97.8 F (36.6 C)  TempSrc:  Oral  Oral  SpO2: 98% 99%    Weight:   61.1 kg    SpO2: 99 % O2 Flow Rate (L/min): 2 L/min   Intake/Output Summary (Last 24 hours) at 05/05/2020 0936 Last data filed at 05/05/2020 0500 Gross per 24 hour  Intake --  Output 40 ml  Net -40 ml   Filed Weights   05/03/20 1341 05/04/20 0410 05/05/20 0805  Weight: 58.3 kg 63.1 kg 61.1 kg    Exam: General exam: In no acute distress. Respiratory system: Good air movement and clear to auscultation. Cardiovascular system: S1 & S2 heard, RRR. No JVD. Gastrointestinal system: Abdomen is nondistended, soft and nontender.  Extremities: No pedal edema. Skin: No rashes, lesions or ulcers Psychiatry: Judgement and insight appear normal. Mood & affect appropriate.   Data Reviewed:    Labs: Basic Metabolic Panel: Recent Labs  Lab 04/28/20 1031 04/29/20 1426 05/03/20 1025  NA 132* 134* 137  K 4.8 4.7 4.9  CL 97* 99 100  CO2 18* 20* 20*  GLUCOSE 113* 83 84  BUN 52* 53* 64*  CREATININE 5.86* 6.16* 7.12*  CALCIUM 7.6* 7.4* 7.9*  PHOS 5.3* 5.6* 6.2*   GFR Estimated Creatinine Clearance: 6 mL/min (A) (by C-G formula based on SCr of 7.12 mg/dL (H)). Liver Function Tests: Recent Labs  Lab 04/28/20 1031 04/29/20 1426 05/03/20  1025  ALBUMIN 2.1* 1.9* 2.1*   No results for input(s): LIPASE, AMYLASE in the last 168 hours. No results for input(s): AMMONIA in the last 168 hours. Coagulation profile No results for input(s): INR, PROTIME in the last 168 hours. COVID-19 Labs  No results for input(s): DDIMER, FERRITIN, LDH, CRP in the last 72 hours.  Lab Results  Component Value Date   SARSCOV2NAA NEGATIVE 05/03/2020   [place] NEGATIVE 05/04/2020   [place] NEGATIVE 03/26/2020   [place] NEGATIVE 03/13/2020    CBC: Recent Labs  Lab 04/28/20 1031 05/02/20 0102 05/03/20 0242 05/04/20 0237 05/05/20 0208  WBC 11.0* 8.4 8.1 8.0 9.0  HGB 9.1* 8.9* 9.2* 9.1* 9.8*  HCT 27.1*  26.9* 28.1* 27.9* 30.1*  MCV 96.4 96.4 96.6 97.2 98.4  PLT 256 182 184 170 181   Cardiac Enzymes: No results for input(s): CKTOTAL, CKMB, CKMBINDEX, TROPONINI in the last 168 hours. BNP (last 3 results) No results for input(s): PROBNP in the last 8760 hours. CBG: Recent Labs  Lab 05/04/20 0651 05/04/20 1059 05/04/20 1612 05/04/20 2125 05/05/20 0526  GLUCAP 83 92 79 78 75   D-Dimer: No results for input(s): DDIMER in the last 72 hours. Hgb A1c: Recent Labs    05/05/20 0208  HGBA1C 4.9   Lipid Profile: Recent Labs    05/05/20 0208  CHOL 99  HDL 32*  LDLCALC 47  TRIG 102  CHOLHDL 3.1   Thyroid function studies: Recent Labs    05/03/20 1728  TSH 6.876*   Anemia work up: No results for input(s): VITAMINB12, FOLATE, FERRITIN, TIBC, IRON, RETICCTPCT in the last 72 hours. Sepsis Labs: Recent Labs  Lab 05/02/20 0102 05/03/20 0242 05/03/20 1728 05/03/20 2048 05/04/20 0237 05/05/20 0208  WBC 8.4 8.1  --   --  8.0 9.0  LATICACIDVEN  --   --  0.8 0.9  --   --    Microbiology Recent Results (from the past 240 hour(s))  SARS Coronavirus 2 by RT PCR (hospital order, performed in [place] [facility] lab) Nasopharyngeal Nasopharyngeal Swab     Status: None   Collection Time: 05/03/20  6:01 PM   Specimen: Nasopharyngeal Swab  Result Value Ref Range Status   SARS Coronavirus 2 NEGATIVE NEGATIVE Final    Comment: (NOTE) SARS-CoV-2 target nucleic acids are NOT DETECTED.  The SARS-CoV-2 RNA is generally detectable in upper and lower respiratory specimens during the acute phase of infection. The lowest concentration of SARS-CoV-2 viral copies this assay can detect is 250 copies / mL. A negative result does not preclude SARS-CoV-2 infection and should not be used as the sole basis for treatment or other patient management decisions.  A negative result may occur with improper specimen collection / handling, submission of specimen other than nasopharyngeal swab,  presence of viral mutation(s) within the areas targeted by this assay, and inadequate number of viral copies (<250 copies / mL). A negative result must be combined with clinical observations, patient history, and epidemiological information.  Fact Sheet for Patients:   StrictlyIdeas.no  Fact Sheet for Healthcare Providers: BankingDealers.co.za  This test is not yet approved or  cleared by the [place] FDA and has been authorized for detection and/or diagnosis of SARS-CoV-2 by FDA under an Emergency Use Authorization (EUA).  This EUA will remain in effect (meaning this test can be used) for the duration of the COVID-19 declaration under Section 564(b)(1) of the Act, 21 U.S.C. section 360bbb-3(b)(1), unless the authorization is terminated or revoked sooner.  Performed at  [facility], [place] [address], [place], [place] [address]   Culture, blood (routine x 2)     Status: None (Preliminary result)   Collection Time: 05/03/20  8:30 PM   Specimen: BLOOD  Result Value Ref Range Status   Specimen Description BLOOD THUMB  Final   Special Requests   Final    AEROBIC BOTTLE ONLY Blood Culture results may not be optimal due to an inadequate volume of blood received in culture bottles Performed at [facility] [address] [place], [place] [address]    Culture PENDING  Incomplete   Report Status PENDING  Incomplete     Medications:   . allopurinol  100 mg Oral BID  . aspirin  300 mg Rectal Daily   Or  . aspirin  325 mg Oral Daily  . atorvastatin  10 mg Oral Daily  . Chlorhexidine Gluconate Cloth  6 each Topical Q0600  . cinacalcet  30 mg Oral Q supper  . collagenase   Topical Daily  . darbepoetin (ARANESP) injection - DIALYSIS  60 mcg Intravenous Q Thu-HD  . docusate sodium  100 mg Oral BID  . feeding supplement (NEPRO CARB STEADY)   Oral BID  . heparin  5,000 Units Subcutaneous Q8H  . hydrALAZINE  50 mg Oral TID   . isosorbide dinitrate  10 mg Oral TID  . multivitamin  1 tablet Oral QHS  . pantoprazole  40 mg Oral Daily  . sevelamer carbonate  1,600 mg Oral TID WC   Continuous Infusions: . sodium chloride    . sodium chloride    . ceFEPime (MAXIPIME) IV 1 g (05/04/20 1618)  . dextrose 5 % and 0.45% NaCl 50 mL/hr at 05/04/20 1848      LOS: 12 days   Charlynne Cousins  [facility]  05/05/2020, 9:36 AM

## 2020-05-06 DIAGNOSIS — R404 Transient alteration of awareness: Secondary | ICD-10-CM | POA: Diagnosis not present

## 2020-05-06 DIAGNOSIS — R69 Illness, unspecified: Secondary | ICD-10-CM | POA: Diagnosis not present

## 2020-05-06 DIAGNOSIS — G9341 Metabolic encephalopathy: Secondary | ICD-10-CM | POA: Diagnosis not present

## 2020-05-06 DIAGNOSIS — T827XXA Infection and inflammatory reaction due to other cardiac and vascular devices, implants and grafts, initial encounter: Principal | ICD-10-CM

## 2020-05-06 LAB — CBC
HCT: 29.9 % — ABNORMAL LOW (ref 36.0–46.0)
Hemoglobin: 9.8 g/dL — ABNORMAL LOW (ref 12.0–15.0)
MCH: 32.5 pg (ref 26.0–34.0)
MCHC: 32.8 g/dL (ref 30.0–36.0)
MCV: 99 fL (ref 80.0–100.0)
Platelets: 187 10*3/uL (ref 150–400)
RBC: 3.02 MIL/uL — ABNORMAL LOW (ref 3.87–5.11)
RDW: 20.5 % — ABNORMAL HIGH (ref 11.5–15.5)
WBC: 8.8 10*3/uL (ref 4.0–10.5)
nRBC: 1 % — ABNORMAL HIGH (ref 0.0–0.2)

## 2020-05-06 LAB — GLUCOSE, CAPILLARY
Glucose-Capillary: 75 mg/dL (ref 70–99)
Glucose-Capillary: 121 mg/dL — ABNORMAL HIGH (ref 70–99)
Glucose-Capillary: 124 mg/dL — ABNORMAL HIGH (ref 70–99)
Glucose-Capillary: 121 mg/dL — ABNORMAL HIGH (ref 70–99)

## 2020-05-06 LAB — APTT: aPTT: 30 seconds (ref 24–36)

## 2020-05-06 LAB — PROTIME-INR
INR: 1.3 — ABNORMAL HIGH (ref 0.8–1.2)
Prothrombin Time: 15.5 seconds — ABNORMAL HIGH (ref 11.4–15.2)

## 2020-05-06 MED ORDER — MELATONIN 3 MG PO TABS
3.0000 mg | ORAL_TABLET | Freq: Every day | ORAL | Status: DC
  Administered 2020-05-06 – 2020-05-11 (×4): 3 mg via ORAL
  Filled 2020-05-06 (×5): qty 1

## 2020-05-06 NOTE — Progress Notes (Signed)
[facility] Progress Note   Subjective: Seen in room, more alert today but nowhere near baseline. Did eat breakfast today, more conversational.  Significant epistaxis overnight. ASA/Heparin held. Appears to have stablized. Neuro W/U for AMS. MRI without acute abnormalities 05/05/20.   Objective Vitals:   05/05/20 2036 05/05/20 2349 05/06/20 0358 05/06/20 0711  BP: (!) 125/43 (!) 124/50 (!) 134/53   Pulse: 84 85 87 (!) 43  Resp: 18 (!) 22 17 10   Temp: 98.3 F (36.8 C) (!) 97.5 F (36.4 C) (!) 97.4 F (36.3 C)   TempSrc: Oral Oral Oral   SpO2: 98% 96% 97% 96%  Weight:    57.5 kg   Physical Exam General:Ill appearing pleasant older female in NAD HEENT: Gauze L nare MHDQQ:I2,L7 2/6 systolic M. Lungs:Bilateral breath sounds decreased in bases otherwise CTAB. No WOB. Abdomen:S, NT, Active BS Extremities:No LE edema. Drsg R foot. Wound vac intact to R femoral wound. Dialysis Access:R AVF + bruit   Additional Objective Labs: Basic Metabolic Panel: Recent Labs  Lab 04/29/20 1426 05/03/20 1025 05/05/20 0909  NA 134* 137 134*  K 4.7 4.9 4.7  CL 99 100 99  CO2 20* 20* 20*  GLUCOSE 83 84 95  BUN 53* 64* 58*  CREATININE 6.16* 7.12* 6.80*  CALCIUM 7.4* 7.9* 7.8*  PHOS 5.6* 6.2* 6.3*   Liver Function Tests: Recent Labs  Lab 04/29/20 1426 05/03/20 1025 05/05/20 0909  ALBUMIN 1.9* 2.1* 2.0*   No results for input(s): LIPASE, AMYLASE in the last 168 hours. CBC: Recent Labs  Lab 05/02/20 0102 05/03/20 0242 05/04/20 0237 05/05/20 0208 05/06/20 0305  WBC 8.4 8.1 8.0 9.0 8.8  HGB 8.9* 9.2* 9.1* 9.8* 9.8*  HCT 26.9* 28.1* 27.9* 30.1* 29.9*  MCV 96.4 96.6 97.2 98.4 99.0  PLT 182 184 170 181 187   Blood Culture    Component Value Date/Time   SDES BLOOD LEFT HAND 05/03/2020 2050   SPECREQUEST  05/03/2020 2050    AEROBIC BOTTLE ONLY Blood Culture results may not be optimal due to an inadequate volume of blood received in culture bottles    CULT  05/03/2020 2050    NO GROWTH 2 DAYS Performed at [facility] [address] [place], [place] [address]    REPTSTATUS PENDING 05/03/2020 2050    Cardiac Enzymes: No results for input(s): CKTOTAL, CKMB, CKMBINDEX, TROPONINI in the last 168 hours. CBG: Recent Labs  Lab 05/05/20 0526 05/05/20 1210 05/05/20 1614 05/05/20 2108 05/06/20 0616  GLUCAP 75 76 82 87 75   Iron Studies: No results for input(s): IRON, TIBC, TRANSFERRIN, FERRITIN in the last 72 hours. @lablastinr3 @ Studies/Results: MR BRAIN WO CONTRAST  Result Date: 05/05/2020 CLINICAL DATA:  TIA.  ESRD. EXAM: MRI HEAD WITHOUT CONTRAST TECHNIQUE: Multiplanar, multiecho pulse sequences of the brain and surrounding structures were obtained without intravenous contrast. COMPARISON:  CT head 05/03/2020 FINDINGS: Brain: Negative for acute infarct. Generalized atrophy. Patchy white matter hyperintensity bilaterally compatible with chronic ischemia. 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 Negative for hemorrhage or mass. Vascular: Normal arterial flow voids. Skull and upper cervical spine: Negative Sinuses/Orbits: Complete opacification of the right maxillary sinus. Remaining sinuses clear. Bilateral cataract extraction Other: None IMPRESSION: No acute abnormality Chronic microvascular ischemic change in the white matter and cerebellum bilaterally. Electronically Signed   By: Franchot Gallo M.D.   On: 05/05/2020 15:28   VAS US CAROTID (at [facility] and [facility] only)  Result Date: 05/04/2020 Carotid Arterial Duplex Study Indications:  Transient alteration of awareness. Risk Factors:      Hypertension, hyperlipidemia, Diabetes, coronary artery                    disease, PAD. Comparison Study:  Prev 11/2019 1-39% Performing Technologist: Vonzell Schlatter RVT  Examination Guidelines: A complete evaluation includes B-mode imaging, spectral Doppler, color Doppler, and power Doppler as needed of all accessible portions of each vessel.  Bilateral testing is considered an integral part of a complete examination. Limited examinations for reoccurring indications may be performed as noted.  Right Carotid Findings: +----------+--------+--------+--------+------------------+--------+           PSV cm/sEDV cm/sStenosisPlaque DescriptionComments +----------+--------+--------+--------+------------------+--------+ CCA Prox  96      9                                          +----------+--------+--------+--------+------------------+--------+ CCA Distal74      12                                         +----------+--------+--------+--------+------------------+--------+ ICA Prox  136     15      1-39%   heterogenous               +----------+--------+--------+--------+------------------+--------+ ICA Distal112     20                                         +----------+--------+--------+--------+------------------+--------+ ECA       113                                                +----------+--------+--------+--------+------------------+--------+ +----------+--------+-------+--------+-------------------+           PSV cm/sEDV cmsDescribeArm Pressure (mmHG) +----------+--------+-------+--------+-------------------+ [id number]                                        +----------+--------+-------+--------+-------------------+  Left Carotid Findings: +----------+--------+--------+--------+------------------+--------+           PSV cm/sEDV cm/sStenosisPlaque DescriptionComments +----------+--------+--------+--------+------------------+--------+ CCA Prox  105                                                +----------+--------+--------+--------+------------------+--------+ CCA Distal102     13                                         +----------+--------+--------+--------+------------------+--------+ ICA Prox  119     21      1-39%   heterogenous                +----------+--------+--------+--------+------------------+--------+ ICA Distal78      17                                         +----------+--------+--------+--------+------------------+--------+  ECA       165                                                +----------+--------+--------+--------+------------------+--------+ +----------+--------+--------+--------+-------------------+           PSV cm/sEDV cm/sDescribeArm Pressure (mmHG) +----------+--------+--------+--------+-------------------+ Subclavian209                                         +----------+--------+--------+--------+-------------------+ +---------+--------+--+--------+--+ VertebralPSV cm/s51EDV cm/s10 +---------+--------+--+--------+--+  Summary: Right Carotid: Velocities in the right ICA are consistent with a 1-39% stenosis. Left Carotid: Velocities in the left ICA are consistent with a 1-39% stenosis. Vertebrals:  Bilateral vertebral arteries demonstrate antegrade flow. Subclavians: Normal flow hemodynamics were seen in bilateral subclavian              arteries. *See table(s) above for measurements and observations.  Electronically signed by Jamelle Haring on 05/04/2020 at 5:17:12 PM.    Final    Medications: . ceFEPime (MAXIPIME) IV 1 g (05/05/20 1551)   . allopurinol  100 mg Oral BID  . aspirin EC  81 mg Oral Daily  . atorvastatin  10 mg Oral Daily  . Chlorhexidine Gluconate Cloth  6 each Topical Q0600  . cinacalcet  30 mg Oral Q supper  . collagenase   Topical Daily  . darbepoetin (ARANESP) injection - DIALYSIS  60 mcg Intravenous Q Thu-HD  . docusate sodium  100 mg Oral BID  . feeding supplement (NEPRO CARB STEADY)   Oral BID  . heparin  5,000 Units Subcutaneous Q8H  . hydrALAZINE  50 mg Oral TID  . isosorbide dinitrate  10 mg Oral TID  . melatonin  3 mg Oral QHS  . multivitamin  1 tablet Oral QHS  . pantoprazole  40 mg Oral Daily  . sevelamer carbonate  1,600 mg Oral TID WC      Dialysis Orders: [facility] QAS60.1 kgHD Bath 2K 2Ca 3 hours 45 minutes,no heparin. AccessRUA AVF Mircera 30 MCG every 2 weeks last on 01/13 units IV/HD Venofer100 mg loading needs 9 more doses  Assessment/Plan:  1. 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 S/pinitial debridement1/17, thenwashout 1/18 by Dr. Donzetta Matters.Per [facility]. Plan for wound vac change 05/07/20.  2. AMS: W/U per primary/Neuro. No acute abnormalities on MRI. Avoid sedating medications. Slightly improved today. Has not missed HD, not uremic.  3. ESRD: Continue HD per usual TTS schedule. Next HD 05/08/20 4. Hypertension/volume-Antihypertensivemeds on hold.Does not appear grossly volume overloaded. UF as tolerated.  5. Anemiaof CKD: Hgb9.8- improving,continue Aranespq Thursday (last dose 60 mcg IV1/27). HoldingIViron load with infection. 6. Metabolic bone disease -Corrected calcium within goal, phos slightly elevated.continue Renvela binder, Sensipar 30 daily 7. Nutrition - renal diet with fluid restrictions, vitamins, Nepro 8. History of CAD 9. History of systolic/diastolic heart failure  Leslee Suire H. Cherise Fedder NP-C 05/06/2020, 10:41 AM  [employer] [phone]

## 2020-05-06 NOTE — Progress Notes (Signed)
Occupational Therapy Treatment Patient Details Name: [name] MRN: [id number] DOB: March 24, 1946 Today's Date: 05/06/2020    History of present illness Patient is a 75 y/o female who presents from SNF due to infected groin wound now s/p I&D right groin wound 1/17, s/p washout of wound and Right sartorius muscle flap coverage and wound vac application 4/40/10. MRI negative for acute changes on 05/05/20. PMH includes Lft fem to below knee pop bypass with saphenous vein, toe amputations, breast ca, left shoulder dislocation, CKD, CAD, STEMI, OA.   OT comments  Pt progressing towards established OT goals and more alert and engaged this session. Continues to require significant time to follow commands and answer questions. Pt requiring Max A to don darco shoe. Pt requiring Mod A for bed mobility and then Min A for stand pivot to recliner with RW. Pt performing oral care with Min A and Mod cues for sequencing. VSS on RA throughout. Continue to recommend dc to SNF and will continue to follow acutely as admitted.    Follow Up Recommendations  SNF;Supervision/Assistance - 24 hour    Equipment Recommendations  3 in 1 bedside commode    Recommendations for Other Services      Precautions / Restrictions Precautions Precautions: Fall;Other (comment) Precaution Comments: keep right heel offloaded per pt, wound vac, soft BP Restrictions Weight Bearing Restrictions: No       Mobility Bed Mobility Overal bed mobility: Needs Assistance Bed Mobility: Supine to Sit     Supine to sit: Mod assist;HOB elevated     General bed mobility comments: Pt able to bring BLEs towards EOB. Mod A to bring hips towards EOB and then elevate trunk.  Transfers Overall transfer level: Needs assistance Equipment used: Rolling walker (2 wheeled) Transfers: Sit to/from [facility] Sit to Stand: Min assist Stand pivot transfers: Min assist       General transfer comment: Min A for power up and  then gaining balance in standing. Min A for managing RW and maintain balance to pivot to recliner.    Balance Overall balance assessment: Needs assistance Sitting-balance support: No upper extremity supported;Feet supported Sitting balance-Leahy Scale: Fair     Standing balance support: Bilateral upper extremity supported;During functional activity Standing balance-Leahy Scale: Poor Standing balance comment: Reliant on UE support                           ADL either performed or assessed with clinical judgement   ADL Overall ADL's : Needs assistance/impaired     Grooming: Oral care;Minimal assistance;Cueing for sequencing;Sitting Grooming Details (indicate cue type and reason): Requiring Min A for initiating task and then Mod cues for sequencing and transitioning between steps.             Lower Body Dressing: Maximal assistance;Sit to/from stand Lower Body Dressing Details (indicate cue type and reason): Max A to don darco Wellsite geologist: Minimal assistance;Stand-pivot;RW (simulated to recliner) Toilet Transfer Details (indicate cue type and reason): Min A for power up and then gaining balance in standing. Min A for managing RW and maintain balance to pivot to recliner.         Functional mobility during ADLs: Minimal assistance;Rolling walker (stand pivot) General ADL Comments: Pt presenting with decreased balance, strength, and activity tolerance. Increased engagement this session     Vision       Perception     Praxis      Cognition Arousal/Alertness: Awake/alert;Lethargic Behavior  During Therapy: WFL for tasks assessed/performed;Flat affect (Less flat than indicated in prior sessions) Overall Cognitive Status: Impaired/Different from baseline Area of Impairment: Attention;Memory;Following commands;Safety/judgement;Problem solving;Awareness                   Current Attention Level: Sustained Memory: Decreased short-term  memory Following Commands: Follows one step commands with increased time;Follows one step commands inconsistently (Requiring signficant time) Safety/Judgement: Decreased awareness of safety;Decreased awareness of deficits Awareness: Intellectual Problem Solving: Slow processing;Requires verbal cues;Decreased initiation;Difficulty sequencing;Requires tactile cues General Comments: Per RN, pt with increased arousal today compared to yesterday. Pt requiring increased time to respond to questions/conversation and commands. Pt with moments of decreased attention and staring off; reports she gets sleepy. Pt with poor awareness of safety and deficits. Generally flat affect but smiling briefly  at therapist and during conversation.        Exercises     Shoulder Instructions       General Comments VSS on RA.    Pertinent Vitals/ Pain       Pain Assessment: Faces Pain Intervention(s): Monitored during session  Home Living                                          Prior Functioning/Environment              Frequency  Min 2X/week        Progress Toward Goals  OT Goals(current goals can now be found in the care plan section)  Progress towards OT goals: Progressing toward goals  Acute Rehab OT Goals Patient Stated Goal: be able to take care of myself OT Goal Formulation: With patient Time For Goal Achievement: 05/15/20 Potential to Achieve Goals: Good ADL Goals Pt Will Perform Grooming: with set-up;sitting Pt Will Perform Lower Body Dressing: with min guard assist;sitting/lateral leans;sit to/from stand Pt Will Transfer to Toilet: with min guard assist;stand pivot transfer;bedside commode Pt Will Perform Toileting - Clothing Manipulation and hygiene: with min guard assist;sitting/lateral leans;sit to/from stand Pt/caregiver will Perform Home Exercise Program: Increased strength;Both right and left upper extremity;With written HEP provided;With  theraband Additional ADL Goal #1: Pt will participate in x8 mins of sitting EOB or OOB ADL tasks in order to increase independence.  Plan Discharge plan remains appropriate    Co-evaluation                 AM-PAC OT "6 Clicks" Daily Activity     Outcome Measure   Help from another person eating meals?: A Little Help from another person taking care of personal grooming?: A Little Help from another person toileting, which includes using toliet, bedpan, or urinal?: A Lot Help from another person bathing (including washing, rinsing, drying)?: A Lot Help from another person to put on and taking off regular upper body clothing?: A Little Help from another person to put on and taking off regular lower body clothing?: A Lot 6 Click Score: 15    End of Session Equipment Utilized During Treatment: Rolling walker  OT Visit Diagnosis: Unsteadiness on feet (R26.81);Muscle weakness (generalized) (M62.81);Pain;Other symptoms and signs involving cognitive function   Activity Tolerance Patient tolerated treatment well;Patient limited by fatigue   Patient Left in chair;with call bell/phone within reach;with chair alarm set   Nurse Communication Mobility status        Time: 1030-1104 OT Time Calculation (min): 34 min  Charges: OT General  Charges $OT Visit: 1 Visit OT Treatments $Self Care/Home Management : 23-37 mins  [place], OTR/L Acute Rehab Pager: [phone] Office: [place] 05/06/2020, 1:01 PM

## 2020-05-06 NOTE — Progress Notes (Addendum)
HOSPITAL MEDICINE OVERNIGHT EVENT NOTE    Notified by nursing that patient has been exhibiting a substantial epistaxis throughout the evening shift.  This is been associated with clots in the oropharynx.  Patient is protecting their airway, there is no evidence of hypoxia.  Epistaxis seems to be slowly improving throughout the shift.  Having said, nursing also reports that patient has been exhibiting excessive bleeding from her injection sites and bleeding around her IV.  Medication review reveals that patient is currently on aspirin as well as unfractionated heparin.   Note that patient exhibited an elevated INR when it was last checked on 1/16.  As far as I can tell, is no history of liver disease.  CBC this morning reveals a stable hemoglobin and no evidence of thrombocytopenia.  Obtaining repeat PTT, PT now.  In the meantime I recommended nursing hold patient's aspirin and heparin until daytime providers consider whether these drugs can be safely discontinued.  Vernelle Emerald  MD [facility]

## 2020-05-06 NOTE — Progress Notes (Addendum)
[facility] CONSULTATION PROGRESS NOTE    Progress Note  [name]  SVX:[id number] DOB: 1946-03-19 DOA: 04/27/2020 PCP: Pamela Pinto, MD     Brief Narrative:   [name] is an 75 y.o. female past medical history of end-stage renal disease on hemodialysis, status post right external iliac and common femoral enterectomy with right femoral to anterior tibial artery bypass insulin-dependent diabetes mellitus type 2, essential hypertension moderate pulmonary hypertension who came into the hospital for an infected right groin surgical wound on 04/23/2020 status post I&D and wound VAC started empirically on antibiotics by vascular surgery. We are consulted as over the last 2 days she became more sleepy with occasional confusion, likely due to polypharmacy once sedatives were held her mentation improved.  An MRI was done that was negative for acute CVA.  Assessment/Plan:   Acute metabolic encephalopathy likely due to polypharmacy/acute confusional state: MRI of the brain was negative for CVA, and that showed chronic ischemic changes. HgbA1c, fasting lipid panel HDL greater than 40 LDL less than 70 continue Lipitor MRI, MRA of the brain without contrast pending Speech evaluation was unremarkable. Physical therapy evaluated the patient recommended skilled nursing facility, will contact TOC. 2D echo showed preserved EF. Mentation is significantly improved did not require any narcotics and the trend of thought was logical and organized. Patient mentation has improved we will continue to follow along with you. Avoid sedatives, narcotics or benzos as she is at high risk of developing acute confusional state and is at risk of aspiration pneumonia.  New onset right-sided pleural effusion: ED of the chest was done that showed a large right pleural effusion a small to moderate left pleural effusion with compressive hemoptysis, there is a subtotal collapse of the right lower lobe. She  is currently asymptomatic. Dialysis should help.  Right groin surgical wound infection: Continue further management for vascular.  IV antibiotics per vascular.  Insulin-dependent diabetes mellitus type 2:  Off insulin her hemoglobin A1c is 4.9. She should go home off oral hypoglycemic agents.  History of pulmonary hypertension: The echo was ordered that showed an EF of 23%, grade 2 diastolic heart failure Pressures are elevated at 60 mmHg bilateral atrial enlargement.  RN Pressure Injury Documentation: Pressure Injury 04/12/2020 Buttocks Right;Left;Mid Stage 1 -  Intact skin with non-blanchable redness of a localized area usually over a bony prominence. redden and tender to touch (Active)  04/10/2020 1930  Location: Buttocks  Location Orientation: Right;Left;Mid  Staging: Stage 1 -  Intact skin with non-blanchable redness of a localized area usually over a bony prominence.  Wound Description (Comments): redden and tender to touch  Present on Admission:     Estimated body mass index is 21.76 kg/m as calculated from the following:   Height as of 04/20/20: 5\' 4"  (1.626 m).   Weight as of this encounter: 57.5 kg.   We will continue to follow along with you.  DVT prophylaxis: Heparin Family Communication: None Status is: Inpatient  Remains inpatient appropriate because:Hemodynamically unstable    Code Status:     Code Status Orders  (From admission, onward)         Start     Ordered   04/23/20 0754  Full code  Continuous        04/23/20 0753        Code Status History    Date Active Date Inactive Code Status Order ID Comments User Context   03/16/2020 1404 03/27/2020 2121 Full Code [id number]  Dagoberto Ligas,  PA-C Inpatient   02/24/2020 1445 02/24/2020 2157 Full Code [id number]  Cherre Robins, MD Inpatient   04/07/2019 1420 04/20/2019 2040 Full Code [id number]  Elizabeth Sauer Inpatient   04/07/2019 1420 04/07/2019 1420 Full Code [id number]  Elizabeth Sauer Inpatient   03/23/2019 1724 04/07/2019 1418 Full Code [id number]  Lequita Halt, MD ED   11/26/2017 1456 12/04/2017 1733 Full Code [id number]  Reyne Dumas, MD ED   11/03/2017 1122 11/05/2017 1814 DNR [id number]  Velna Ochs, MD ED   05/04/2017 0048 05/07/2017 1608 Full Code [id number]  Jani Gravel, MD Inpatient   06/04/2013 1656 06/08/2013 1954 Full Code [id number]  Barton Dubois, MD Inpatient   04/20/2013 1522 04/21/2013 1727 Full Code [id number]  Rolm Bookbinder, MD Inpatient   08/27/2012 1803 08/28/2012 1920 Full Code [id number]  Newt Minion, MD Inpatient   Advance Care Planning Activity        IV Access:    Peripheral IV   Procedures and diagnostic studies:   MR BRAIN WO CONTRAST  Result Date: 05/05/2020 CLINICAL DATA:  TIA.  ESRD. EXAM: MRI HEAD WITHOUT CONTRAST TECHNIQUE: Multiplanar, multiecho pulse sequences of the brain and surrounding structures were obtained without intravenous contrast. COMPARISON:  CT head 05/03/2020 FINDINGS: Brain: Negative for acute infarct. Generalized atrophy. Patchy white matter hyperintensity bilaterally compatible with chronic ischemia. 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 Negative for hemorrhage or mass. Vascular: Normal arterial flow voids. Skull and upper cervical spine: Negative Sinuses/Orbits: Complete opacification of the right maxillary sinus. Remaining sinuses clear. Bilateral cataract extraction Other: None IMPRESSION: No acute abnormality Chronic microvascular ischemic change in the white matter and cerebellum bilaterally. Electronically Signed   By: Franchot Gallo M.D.   On: 05/05/2020 15:28   ECHOCARDIOGRAM COMPLETE  Result Date: 05/04/2020    ECHOCARDIOGRAM REPORT   Patient Name:   [name] [name] Date of Exam: 05/04/2020 Medical Rec #:  [id number]       Height:       64.0 in Accession #:    [id number]      Weight:       139.1 lb Date of Birth:  12/18/1945       BSA:          1.677 m Patient Age:    9 years        BP:            115/38 mmHg Patient Gender: F               HR:           80 bpm. Exam Location:  Inpatient Procedure: 2D Echo Indications:    Pulmonary Hypertension I27.2  History:        Patient has prior history of Echocardiogram examinations, most                 recent 12/01/2017. CAD, Prior CABG; Risk Factors:Diabetes,                 Hypertension and Dyslipidemia.  Sonographer:    Mikki Santee RDCS (AE) Referring Phys: [id number] Lequita Halt IMPRESSIONS  1. Left ventricular ejection fraction, by estimation, is 50 to 55%. The left ventricle has low normal function. The left ventricle demonstrates regional wall motion abnormalities (see scoring diagram/findings for description). Left ventricular diastolic  parameters are consistent with Grade II diastolic dysfunction (pseudonormalization). Elevated left ventricular end-diastolic pressure. There is incoordinate septal motion.  2. Right ventricular systolic function is mildly reduced.  The right ventricular size is normal. There is severely elevated pulmonary artery systolic pressure. The estimated right ventricular systolic pressure is 74.1 mmHg.  3. Left atrial size was severely dilated.  4. Right atrial size was mildly dilated.  5. The mitral valve is abnormal. Severe mitral valve regurgitation.  6. Tricuspid valve regurgitation is moderate to severe.  7. The aortic valve is tricuspid. Aortic valve regurgitation is not visualized. Mild aortic valve sclerosis is present, with no evidence of aortic valve stenosis.  8. The inferior vena cava is dilated in size with <50% respiratory variability, suggesting right atrial pressure of 15 mmHg. Comparison(s): Changes from prior study are noted. 11/27/2017: LVEF 50-55%, moderate MR, moderate TR, mild LAE, RVSP not reported. FINDINGS  Left Ventricle: Left ventricular ejection fraction, by estimation, is 50 to 55%. The left ventricle has low normal function. The left ventricle demonstrates regional wall motion abnormalities. The  left ventricular internal cavity size was normal in size. There is borderline left ventricular hypertrophy. Incoordinate septal motion. Left ventricular diastolic parameters are consistent with Grade II diastolic dysfunction (pseudonormalization). Elevated left ventricular end-diastolic pressure. Right Ventricle: The right ventricular size is normal. No increase in right ventricular wall thickness. Right ventricular systolic function is mildly reduced. There is severely elevated pulmonary artery systolic pressure. The tricuspid regurgitant velocity is 3.37 m/s, and with an assumed right atrial pressure of 15 mmHg, the estimated right ventricular systolic pressure is 28.7 mmHg. Left Atrium: Left atrial size was severely dilated. Right Atrium: Right atrial size was mildly dilated. Pericardium: There is no evidence of pericardial effusion. Mitral Valve: The mitral valve is abnormal. There is mild thickening of the mitral valve leaflet(s). There is moderate calcification of the posterior mitral valve leaflet(s). Mild to moderate mitral annular calcification. Severe mitral valve regurgitation, with centrally-directed jet. MV peak gradient, 13.1 mmHg. The mean mitral valve gradient is 7.0 mmHg. Tricuspid Valve: The tricuspid valve is grossly normal. Tricuspid valve regurgitation is moderate to severe. Aortic Valve: The aortic valve is tricuspid. Aortic valve regurgitation is not visualized. Mild aortic valve sclerosis is present, with no evidence of aortic valve stenosis. Pulmonic Valve: The pulmonic valve was grossly normal. Pulmonic valve regurgitation is trivial. Aorta: The aortic root and ascending aorta are structurally normal, with no evidence of dilitation. Venous: The inferior vena cava is dilated in size with less than 50% respiratory variability, suggesting right atrial pressure of 15 mmHg. IAS/Shunts: No atrial level shunt detected by color flow Doppler.  LEFT VENTRICLE PLAX 2D LVIDd:         4.60 cm   Diastology LVIDs:         3.90 cm  LV e' medial:    2.55 cm/s LV PW:         1.00 cm  LV E/e' medial:  65.9 LV IVS:        1.00 cm  LV e' lateral:   3.38 cm/s LVOT diam:     1.80 cm  LV E/e' lateral: 49.7 LV SV:         49 LV SV Index:   29 LVOT Area:     2.54 cm  RIGHT VENTRICLE RV S prime:     8.10 cm/s TAPSE (M-mode): 1.3 cm LEFT ATRIUM              Index       RIGHT ATRIUM           Index LA diam:        4.70 cm  2.80 cm/m  RA Area:     17.40 cm LA Vol (A2C):   128.0 ml 76.34 ml/m RA Volume:   42.30 ml  25.23 ml/m LA Vol (A4C):   83.5 ml  49.80 ml/m LA Biplane Vol: 107.0 ml 63.82 ml/m  AORTIC VALVE LVOT Vmax:   82.00 cm/s LVOT Vmean:  60.700 cm/s LVOT VTI:    0.193 m  AORTA Ao Root diam: 2.60 cm MITRAL VALVE                 TRICUSPID VALVE MV Area (PHT): 5.66 cm      TR Peak grad:   45.4 mmHg MV Area VTI:   1.32 cm      TR Vmax:        337.00 cm/s MV Peak grad:  13.1 mmHg MV Mean grad:  7.0 mmHg      SHUNTS MV Vmax:       1.81 m/s      Systemic VTI:  0.19 m MV Vmean:      129.0 cm/s    Systemic Diam: 1.80 cm MV Decel Time: 134 msec MR Peak grad:    122.5 mmHg MR Mean grad:    78.5 mmHg MR Vmax:         553.50 cm/s MR Vmean:        417.5 cm/s MR PISA:         5.09 cm MR PISA Eff ROA: 37 mm MR PISA Radius:  0.90 cm MV E velocity: 168.00 cm/s MV A velocity: 128.00 cm/s MV E/A ratio:  1.31 Lyman Bishop MD Electronically signed by Lyman Bishop MD Signature Date/Time: 05/04/2020/10:20:15 AM    Final    VAS US CAROTID (at [facility] and [facility] only)  Result Date: 05/04/2020 Carotid Arterial Duplex Study Indications:       Transient alteration of awareness. Risk Factors:      Hypertension, hyperlipidemia, Diabetes, coronary artery                    disease, PAD. Comparison Study:  Prev 11/2019 1-39% Performing Technologist: Vonzell Schlatter RVT  Examination Guidelines: A complete evaluation includes B-mode imaging, spectral Doppler, color Doppler, and power Doppler as needed of all accessible portions of each vessel.  Bilateral testing is considered an integral part of a complete examination. Limited examinations for reoccurring indications may be performed as noted.  Right Carotid Findings: +----------+--------+--------+--------+------------------+--------+           PSV cm/sEDV cm/sStenosisPlaque DescriptionComments +----------+--------+--------+--------+------------------+--------+ CCA Prox  96      9                                          +----------+--------+--------+--------+------------------+--------+ CCA Distal74      12                                         +----------+--------+--------+--------+------------------+--------+ ICA Prox  136     15      1-39%   heterogenous               +----------+--------+--------+--------+------------------+--------+ ICA Distal112     20                                         +----------+--------+--------+--------+------------------+--------+  ECA       113                                                +----------+--------+--------+--------+------------------+--------+ +----------+--------+-------+--------+-------------------+           PSV cm/sEDV cmsDescribeArm Pressure (mmHG) +----------+--------+-------+--------+-------------------+ [id number]                                        +----------+--------+-------+--------+-------------------+  Left Carotid Findings: +----------+--------+--------+--------+------------------+--------+           PSV cm/sEDV cm/sStenosisPlaque DescriptionComments +----------+--------+--------+--------+------------------+--------+ CCA Prox  105                                                +----------+--------+--------+--------+------------------+--------+ CCA Distal102     13                                         +----------+--------+--------+--------+------------------+--------+ ICA Prox  119     21      1-39%   heterogenous                +----------+--------+--------+--------+------------------+--------+ ICA Distal78      17                                         +----------+--------+--------+--------+------------------+--------+ ECA       165                                                +----------+--------+--------+--------+------------------+--------+ +----------+--------+--------+--------+-------------------+           PSV cm/sEDV cm/sDescribeArm Pressure (mmHG) +----------+--------+--------+--------+-------------------+ Subclavian209                                         +----------+--------+--------+--------+-------------------+ +---------+--------+--+--------+--+ VertebralPSV cm/s51EDV cm/s10 +---------+--------+--+--------+--+  Summary: Right Carotid: Velocities in the right ICA are consistent with a 1-39% stenosis. Left Carotid: Velocities in the left ICA are consistent with a 1-39% stenosis. Vertebrals:  Bilateral vertebral arteries demonstrate antegrade flow. Subclavians: Normal flow hemodynamics were seen in bilateral subclavian              arteries. *See table(s) above for measurements and observations.  Electronically signed by Jamelle Haring on 05/04/2020 at 5:17:12 PM.    Final      Medical Consultants:    None.  Anti-Infectives:   Cefepime  Subjective:    [name] relates she still very weak.  Objective:    Vitals:   05/05/20 2036 05/05/20 2349 05/06/20 0358 05/06/20 0711  BP: (!) 125/43 (!) 124/50 (!) 134/53   Pulse: 84 85 87 (!) 43  Resp: 18 (!) 22 17 10   Temp: 98.3 F (36.8 C) (!) 97.5 F (36.4 C) (!) 97.4 F (  36.3 C)   TempSrc: Oral Oral Oral   SpO2: 98% 96% 97% 96%  Weight:    57.5 kg   SpO2: 96 % O2 Flow Rate (L/min): 2 L/min   Intake/Output Summary (Last 24 hours) at 05/06/2020 0838 Last data filed at 05/05/2020 1050 Gross per 24 hour  Intake --  Output 1500 ml  Net -1500 ml   Filed Weights   05/05/20 0805 05/05/20 1050 05/06/20 0711   Weight: 61.1 kg 59.5 kg 57.5 kg    Exam: General exam: In no acute distress. Respiratory system: Good air movement and clear to auscultation. Cardiovascular system: S1 & S2 heard, RRR. No JVD. Gastrointestinal system: Abdomen is nondistended, soft and nontender.  Extremities: No pedal edema. Skin: No rashes, lesions or ulcers Psychiatry: Judgement and insight appear normal. Mood & affect appropriate.   Data Reviewed:    Labs: Basic Metabolic Panel: Recent Labs  Lab 04/29/20 1426 05/03/20 1025 05/05/20 0909  NA 134* 137 134*  K 4.7 4.9 4.7  CL 99 100 99  CO2 20* 20* 20*  GLUCOSE 83 84 95  BUN 53* 64* 58*  CREATININE 6.16* 7.12* 6.80*  CALCIUM 7.4* 7.9* 7.8*  PHOS 5.6* 6.2* 6.3*   GFR Estimated Creatinine Clearance: 6.3 mL/min (A) (by C-G formula based on SCr of 6.8 mg/dL (H)). Liver Function Tests: Recent Labs  Lab 04/29/20 1426 05/03/20 1025 05/05/20 0909  ALBUMIN 1.9* 2.1* 2.0*   No results for input(s): LIPASE, AMYLASE in the last 168 hours. No results for input(s): AMMONIA in the last 168 hours. Coagulation profile Recent Labs  Lab 05/06/20 0625  INR 1.3*   COVID-19 Labs  No results for input(s): DDIMER, FERRITIN, LDH, CRP in the last 72 hours.  Lab Results  Component Value Date   SARSCOV2NAA NEGATIVE 05/03/2020   [place] NEGATIVE 04/30/2020   [place] NEGATIVE 03/26/2020   [place] NEGATIVE 03/13/2020    CBC: Recent Labs  Lab 05/02/20 0102 05/03/20 0242 05/04/20 0237 05/05/20 0208 05/06/20 0305  WBC 8.4 8.1 8.0 9.0 8.8  HGB 8.9* 9.2* 9.1* 9.8* 9.8*  HCT 26.9* 28.1* 27.9* 30.1* 29.9*  MCV 96.4 96.6 97.2 98.4 99.0  PLT 182 184 170 181 187   Cardiac Enzymes: No results for input(s): CKTOTAL, CKMB, CKMBINDEX, TROPONINI in the last 168 hours. BNP (last 3 results) No results for input(s): PROBNP in the last 8760 hours. CBG: Recent Labs  Lab 05/05/20 0526 05/05/20 1210 05/05/20 1614 05/05/20 2108 05/06/20 0616  GLUCAP  75 76 82 87 75   D-Dimer: No results for input(s): DDIMER in the last 72 hours. Hgb A1c: Recent Labs    05/05/20 0208  HGBA1C 4.9   Lipid Profile: Recent Labs    05/05/20 0208  CHOL 99  HDL 32*  LDLCALC 47  TRIG 102  CHOLHDL 3.1   Thyroid function studies: Recent Labs    05/03/20 1728  TSH 6.876*   Anemia work up: No results for input(s): VITAMINB12, FOLATE, FERRITIN, TIBC, IRON, RETICCTPCT in the last 72 hours. Sepsis Labs: Recent Labs  Lab 05/03/20 0242 05/03/20 1728 05/03/20 2048 05/04/20 0237 05/05/20 0208 05/06/20 0305  WBC 8.1  --   --  8.0 9.0 8.8  LATICACIDVEN  --  0.8 0.9  --   --   --    Microbiology Recent Results (from the past 240 hour(s))  SARS Coronavirus 2 by RT PCR (hospital order, performed in [facility] hospital lab) Nasopharyngeal Nasopharyngeal Swab     Status: None   Collection  Time: 05/03/20  6:01 PM   Specimen: Nasopharyngeal Swab  Result Value Ref Range Status   SARS Coronavirus 2 NEGATIVE NEGATIVE Final    Comment: (NOTE) SARS-CoV-2 target nucleic acids are NOT DETECTED.  The SARS-CoV-2 RNA is generally detectable in upper and lower respiratory specimens during the acute phase of infection. The lowest concentration of SARS-CoV-2 viral copies this assay can detect is 250 copies / mL. A negative result does not preclude SARS-CoV-2 infection and should not be used as the sole basis for treatment or other patient management decisions.  A negative result may occur with improper specimen collection / handling, submission of specimen other than nasopharyngeal swab, presence of viral mutation(s) within the areas targeted by this assay, and inadequate number of viral copies (<250 copies / mL). A negative result must be combined with clinical observations, patient history, and epidemiological information.  Fact Sheet for Patients:   StrictlyIdeas.no  Fact Sheet for Healthcare  Providers: BankingDealers.co.za  This test is not yet approved or  cleared by the [place] FDA and has been authorized for detection and/or diagnosis of SARS-CoV-2 by FDA under an Emergency Use Authorization (EUA).  This EUA will remain in effect (meaning this test can be used) for the duration of the COVID-19 declaration under Section 564(b)(1) of the Act, 21 U.S.C. section 360bbb-3(b)(1), unless the authorization is terminated or revoked sooner.  Performed at [facility], [place] [address] [place], [place] [address]   Culture, blood (routine x 2)     Status: None (Preliminary result)   Collection Time: 05/03/20  8:30 PM   Specimen: BLOOD  Result Value Ref Range Status   Specimen Description BLOOD THUMB  Final   Special Requests   Final    AEROBIC BOTTLE ONLY Blood Culture results may not be optimal due to an inadequate volume of blood received in culture bottles   Culture   Final    NO GROWTH 2 DAYS Performed at [facility], [place] [address], [place], [place] [address]    Report Status PENDING  Incomplete  Culture, blood (routine x 2)     Status: None (Preliminary result)   Collection Time: 05/03/20  8:50 PM   Specimen: BLOOD LEFT HAND  Result Value Ref Range Status   Specimen Description BLOOD LEFT HAND  Final   Special Requests   Final    AEROBIC BOTTLE ONLY Blood Culture results may not be optimal due to an inadequate volume of blood received in culture bottles   Culture   Final    NO GROWTH 2 DAYS Performed at [facility], [place] [address], [place], [place] [address]    Report Status PENDING  Incomplete     Medications:   . allopurinol  100 mg Oral BID  . aspirin EC  81 mg Oral Daily  . atorvastatin  10 mg Oral Daily  . Chlorhexidine Gluconate Cloth  6 each Topical Q0600  . cinacalcet  30 mg Oral Q supper  . collagenase   Topical Daily  . darbepoetin (ARANESP) injection - DIALYSIS  60 mcg Intravenous Q Thu-HD  . docusate  sodium  100 mg Oral BID  . feeding supplement (NEPRO CARB STEADY)   Oral BID  . heparin  5,000 Units Subcutaneous Q8H  . hydrALAZINE  50 mg Oral TID  . isosorbide dinitrate  10 mg Oral TID  . multivitamin  1 tablet Oral QHS  . pantoprazole  40 mg Oral Daily  . sevelamer carbonate  1,600 mg Oral TID WC   Continuous Infusions: . ceFEPime (MAXIPIME) IV 1 g (05/05/20 1551)  . dextrose 5 % and 0.45% NaCl 50 mL/hr at 05/05/20 2022      LOS: 13 days   Charlynne Cousins  [facility]  05/06/2020, 8:38 AM

## 2020-05-06 NOTE — Progress Notes (Addendum)
Progress Note    05/06/2020 8:58 AM 12 Days Post-Op  Subjective:  Wants to know how her baby is as she is scheduled to deliver.  Denies any pain.  Afebrile HR 80's-90's  789'F-810'F systolic 75% RA  Vitals:   05/06/20 0358 05/06/20 0711  BP: (!) 134/53   Pulse: 87 (!) 43  Resp: 17 10  Temp: (!) 97.4 F (36.3 C)   SpO2: 97% 96%    Physical Exam: General:  Resting comfortably Lungs:  Non labored Incisions:  Right groin with wound vac with good seal Extremities:  Brisk right DP/PT   CBC    Component Value Date/Time   WBC 8.8 05/06/2020 0305   RBC 3.02 (L) 05/06/2020 0305   HGB 9.8 (L) 05/06/2020 0305   HGB 11.7 05/18/2017 1218   HGB 12.3 02/02/2017 1136   HCT 29.9 (L) 05/06/2020 0305   HCT 34.7 05/18/2017 1218   HCT 37.4 02/02/2017 1136   PLT 187 05/06/2020 0305   PLT 227 05/18/2017 1218   MCV 99.0 05/06/2020 0305   MCV 87 05/18/2017 1218   MCV 90.6 02/02/2017 1136   MCH 32.5 05/06/2020 0305   MCHC 32.8 05/06/2020 0305   RDW 20.5 (H) 05/06/2020 0305   RDW 16.0 (H) 05/18/2017 1218   RDW 15.7 (H) 02/02/2017 1136   LYMPHSABS 1.1 04/23/2020 2141   LYMPHSABS 1.8 02/02/2017 1136   MONOABS 0.7 04/12/2020 2141   MONOABS 0.8 02/02/2017 1136   EOSABS 0.1 04/18/2020 2141   EOSABS 0.3 02/02/2017 1136   BASOSABS 0.0 04/25/2020 2141   BASOSABS 0.1 02/02/2017 1136    BMET    Component Value Date/Time   NA 134 (L) 05/05/2020 0909   NA 143 05/18/2017 1218   NA 143 02/02/2017 1137   K 4.7 05/05/2020 0909   K 3.8 02/02/2017 1137   CL 99 05/05/2020 0909   CO2 20 (L) 05/05/2020 0909   CO2 24 02/02/2017 1137   GLUCOSE 95 05/05/2020 0909   GLUCOSE 74 02/02/2017 1137   BUN 58 (H) 05/05/2020 0909   BUN 76 (HH) 05/18/2017 1218   BUN 63.4 (H) 02/02/2017 1137   CREATININE 6.80 (H) 05/05/2020 0909   CREATININE 5.12 (H) 02/13/2020 1518   CREATININE 3.0 (HH) 02/02/2017 1137   CALCIUM 7.8 (L) 05/05/2020 0909   CALCIUM 9.9 02/02/2017 1137   GFRNONAA 6 (L) 05/05/2020  0909   GFRNONAA 8 (L) 02/13/2020 1518   GFRAA 9 (L) 02/13/2020 1518    INR    Component Value Date/Time   INR 1.3 (H) 05/06/2020 0625     Intake/Output Summary (Last 24 hours) at 05/06/2020 0858 Last data filed at 05/05/2020 1050 Gross per 24 hour  Intake -  Output 1500 ml  Net -1500 ml     Assessment:  75 y.o. female is s/p:  POD 11 rightgroin sartorius muscle flapsecondary to right groin wound infection.She is now POD 13 from initial wash-out and debridement.   She is seven weeks post-op right EIA and rightcommon femoral endarterectomy with Dacron patch angioplasty, right femoral to anterior tibial bypass with 6 mm ringed propatent Gore-Tex graft.  12 Days Post-Op  Plan: -pt with delirium this am as she is only alert to place.  Discussed with TRH and it is felt to be due to narcotics.  Will hold these at this time.  -pt does follow commands and moves all extremities equally.   -nose bleeds over night-hgb stable -float heel  Leontine Locket, PA-C [facility] [phone]  05/06/2020 8:58 AM  VASCULAR STAFF ADDENDUM: I have independently interviewed and examined the patient. I agree with the above.  Continue local wound care to groins.   Yevonne Aline. Stanford Breed, MD [facility] Phone Number: [phone] 05/06/2020 11:40 AM

## 2020-05-07 DIAGNOSIS — G9341 Metabolic encephalopathy: Secondary | ICD-10-CM | POA: Diagnosis not present

## 2020-05-07 DIAGNOSIS — I639 Cerebral infarction, unspecified: Secondary | ICD-10-CM | POA: Diagnosis not present

## 2020-05-07 DIAGNOSIS — R69 Illness, unspecified: Secondary | ICD-10-CM | POA: Diagnosis not present

## 2020-05-07 DIAGNOSIS — R404 Transient alteration of awareness: Secondary | ICD-10-CM | POA: Diagnosis not present

## 2020-05-07 LAB — RENAL FUNCTION PANEL
Albumin: 2.1 g/dL — ABNORMAL LOW (ref 3.5–5.0)
Anion gap: 16 — ABNORMAL HIGH (ref 5–15)
BUN: 64 mg/dL — ABNORMAL HIGH (ref 8–23)
CO2: 19 mmol/L — ABNORMAL LOW (ref 22–32)
Calcium: 8.4 mg/dL — ABNORMAL LOW (ref 8.9–10.3)
Chloride: 98 mmol/L (ref 98–111)
Creatinine, Ser: 7.24 mg/dL — ABNORMAL HIGH (ref 0.44–1.00)
GFR, Estimated: 5 mL/min — ABNORMAL LOW (ref >60)
Glucose, Bld: 140 mg/dL — ABNORMAL HIGH (ref 70–99)
Phosphorus: 5.3 mg/dL — ABNORMAL HIGH (ref 2.5–4.6)
Potassium: 4.1 mmol/L (ref 3.5–5.1)
Sodium: 133 mmol/L — ABNORMAL LOW (ref 135–145)

## 2020-05-07 LAB — CBC
HCT: 26 % — ABNORMAL LOW (ref 36.0–46.0)
Hemoglobin: 8.4 g/dL — ABNORMAL LOW (ref 12.0–15.0)
MCH: 31.9 pg (ref 26.0–34.0)
MCHC: 32.3 g/dL (ref 30.0–36.0)
MCV: 98.9 fL (ref 80.0–100.0)
Platelets: 185 10*3/uL (ref 150–400)
RBC: 2.63 MIL/uL — ABNORMAL LOW (ref 3.87–5.11)
RDW: 20.5 % — ABNORMAL HIGH (ref 11.5–15.5)
WBC: 9 10*3/uL (ref 4.0–10.5)
nRBC: 1.6 % — ABNORMAL HIGH (ref 0.0–0.2)

## 2020-05-07 LAB — HEPATITIS B SURFACE ANTIGEN: Hepatitis B Surface Ag: NONREACTIVE

## 2020-05-07 LAB — GLUCOSE, CAPILLARY
Glucose-Capillary: 130 mg/dL — ABNORMAL HIGH (ref 70–99)
Glucose-Capillary: 132 mg/dL — ABNORMAL HIGH (ref 70–99)
Glucose-Capillary: 127 mg/dL — ABNORMAL HIGH (ref 70–99)
Glucose-Capillary: 96 mg/dL (ref 70–99)

## 2020-05-07 MED ORDER — CHLORHEXIDINE GLUCONATE CLOTH 2 % EX PADS
6.0000 | MEDICATED_PAD | Freq: Every day | CUTANEOUS | Status: DC
  Administered 2020-05-08 – 2020-05-10 (×2): 6 via TOPICAL

## 2020-05-07 NOTE — Progress Notes (Addendum)
[facility] Progress Note   Subjective:   Seated on bedside, alert, requests help laying back in bed. Denies SOB, CP, palpitations, dizziness, abdominal pain and nausea. Mental status seems to be improved.  Objective Vitals:   05/06/20 2026 05/07/20 0110 05/07/20 0400 05/07/20 0810  BP: (!) 123/47 (!) 127/45 (!) 116/55 (!) 123/54  Pulse: (!) 58 97 96 92  Resp: 20 15 16 15   Temp: 97.6 F (36.4 C) 97.7 F (36.5 C) 98 F (36.7 C) 98.1 F (36.7 C)  TempSrc: Oral Oral Oral Oral  SpO2: 100% 95% 94% 100%  Weight:       Physical Exam General: Well developed, frail appearing female in NAD. Gauze with dried blood over nares Heart: RRR, no murmurs, rubs or gallops Lungs: CTA bilaterally without wheezing, rhonchi or rales Abdomen: Soft, non-distended, +BS Extremities: No edema b/l lower extremities Dialysis Access: RUE AVF + bruit  Additional Objective Labs: Basic Metabolic Panel: Recent Labs  Lab 05/03/20 1025 05/05/20 0909  NA 137 134*  K 4.9 4.7  CL 100 99  CO2 20* 20*  GLUCOSE 84 95  BUN 64* 58*  CREATININE 7.12* 6.80*  CALCIUM 7.9* 7.8*  PHOS 6.2* 6.3*   Liver Function Tests: Recent Labs  Lab 05/03/20 1025 05/05/20 0909  ALBUMIN 2.1* 2.0*   CBC: Recent Labs  Lab 05/03/20 0242 05/04/20 0237 05/05/20 0208 05/06/20 0305 05/07/20 0142  WBC 8.1 8.0 9.0 8.8 9.0  HGB 9.2* 9.1* 9.8* 9.8* 8.4*  HCT 28.1* 27.9* 30.1* 29.9* 26.0*  MCV 96.6 97.2 98.4 99.0 98.9  PLT 184 170 181 187 185   Blood Culture    Component Value Date/Time   SDES BLOOD LEFT HAND 05/03/2020 2050   SPECREQUEST  05/03/2020 2050    AEROBIC BOTTLE ONLY Blood Culture results may not be optimal due to an inadequate volume of blood received in culture bottles   CULT  05/03/2020 2050    NO GROWTH 2 DAYS Performed at [facility] [address], [place], [place] [address]    REPTSTATUS PENDING 05/03/2020 2050   CBG: Recent Labs  Lab 05/06/20 1200 05/06/20 1638  05/06/20 2138 05/07/20 0529 05/07/20 1055  GLUCAP 121* 124* 121* 130* 132*    Studies/Results: MR BRAIN WO CONTRAST  Result Date: 05/05/2020 CLINICAL DATA:  TIA.  ESRD. EXAM: MRI HEAD WITHOUT CONTRAST TECHNIQUE: Multiplanar, multiecho pulse sequences of the brain and surrounding structures were obtained without intravenous contrast. COMPARISON:  CT head 05/03/2020 FINDINGS: Brain: Negative for acute infarct. Generalized atrophy. Patchy white matter hyperintensity bilaterally compatible with chronic ischemia. 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 Negative for hemorrhage or mass. Vascular: Normal arterial flow voids. Skull and upper cervical spine: Negative Sinuses/Orbits: Complete opacification of the right maxillary sinus. Remaining sinuses clear. Bilateral cataract extraction Other: None IMPRESSION: No acute abnormality Chronic microvascular ischemic change in the white matter and cerebellum bilaterally. Electronically Signed   By: Franchot Gallo M.D.   On: 05/05/2020 15:28   Medications: . ceFEPime (MAXIPIME) IV 1 g (05/06/20 1621)   . allopurinol  100 mg Oral BID  . aspirin EC  81 mg Oral Daily  . atorvastatin  10 mg Oral Daily  . Chlorhexidine Gluconate Cloth  6 each Topical Q0600  . cinacalcet  30 mg Oral Q supper  . collagenase   Topical Daily  . darbepoetin (ARANESP) injection - DIALYSIS  60 mcg Intravenous Q Thu-HD  . docusate sodium  100 mg Oral BID  . feeding supplement (  NEPRO CARB STEADY)   Oral BID  . hydrALAZINE  50 mg Oral TID  . isosorbide dinitrate  10 mg Oral TID  . melatonin  3 mg Oral QHS  . multivitamin  1 tablet Oral QHS  . pantoprazole  40 mg Oral Daily  . sevelamer carbonate  1,600 mg Oral TID WC    Dialysis Orders: NWon TTS YVO59.2 kgHD Bath 2K 2Ca 3 hours 45 minutes,no heparin. AccessRUA AVF Mircera 30 MCG every 2 weeks last on 01/13 units IV/HD Venofer100 mg loading needs 9 more doses  Assessment/Plan:  1. Infected right  groin surgical wound: Wound from Dec 2021 Fem-pop bypass. S/pinitial debridement1/17, thenwashout 1/18 by Dr. Donzetta Matters.Per [facility]. 2. AMS: W/U per primary/Neuro. No acute abnormalities on MRI. Avoid sedating medications. Slightly improved today. Has not missed HD, not uremic.  3. ESRD: Continue HD per usual TTS schedule.Next HD 05/08/20 4. Hypertension/volume-Antihypertensivemeds on hold.Does not appear grossly volume overloaded.UF as tolerated.Large pleural effusion on imaging, will try to keep volume down with HD.  5. Anemiaof CKD: Hgb9.8 > 8.4, no bleeding reported,continue Aranespq Thursday (last dose 60 mcg IV1/27). HoldingIViron load with infection. 6. Metabolic bone disease -Corrected calcium within goal, phos slightly elevated.continue Renvela binder, Sensipar 30 daily 7. Nutrition - renal diet with fluid restrictions, vitamins, Nepro 8. History of CAD 9. History of systolic/diastolic heart failure 10. Epistaxis: Likely cause of anemia decline. INR 1.3, plts ok. Per primary. No heparin with HD.    [name], PA-C 05/07/2020, 11:58 AM  [facility] Pager: [phone]  I have seen and examined this patient and agree with plan and assessment in the above note with renal recommendations/intervention highlighted.  Plan to get back on regular outpatient schedule tomorrow.  Governor Rooks Raynald Rouillard,MD 05/07/2020 12:52 PM

## 2020-05-07 NOTE — Progress Notes (Addendum)
[facility] [place]  Subjective  - Not responding to verbal ques, RN states she did well yesterday and sat up in the chair.   Objective (!) 116/55 96 98 F (36.7 C) (Oral) 16 94% No intake or output data in the 24 hours ending 05/07/20 0715  Right groin wound vac to suction with good seal Doppler DP/PT intact Non labored breathing  Assessment/Planning: POD # 12 rightgroin sartorius muscle flapsecondary to right groin wound infection.She is now POD 70from initial wash-out and debridement.  She is seven weeks post-op right EIA and rightcommon femoral endarterectomy with Dacron patch angioplasty, right femoral to anterior tibial bypass with 6 mm ringed propatent Gore-Tex graft.  13 Days Post-Op Pending further work on delirium Narcotics on hold HGB drop 9.8 to 8.4 Leg is warm and well perfused She is non responsive to verbal ques.  Roxy Horseman 05/07/2020 7:15 AM --  Laboratory Lab Results: Recent Labs    05/06/20 0305 05/07/20 0142  WBC 8.8 9.0  HGB 9.8* 8.4*  HCT 29.9* 26.0*  PLT 187 185   BMET Recent Labs    05/05/20 0909  NA 134*  K 4.7  CL 99  CO2 20*  GLUCOSE 95  BUN 58*  CREATININE 6.80*  CALCIUM 7.8*    COAG Lab Results  Component Value Date   INR 1.3 (H) 05/06/2020   INR 1.6 (H) 05/07/2020   INR 1.2 03/14/2020   No results found for: PTT   VASCULAR STAFF ADDENDUM: I have independently interviewed and examined the patient. I agree with the above.   Yevonne Aline. Stanford Breed, MD [facility] Phone Number: [phone] 05/07/2020 9:08 AM

## 2020-05-07 NOTE — TOC Progression Note (Signed)
Transition of Care [facility] - Progression Note    Patient Details  Name: [name] MRN: [id number] Date of Birth: November 27, 1945  Transition of Care [facility]) CM/SW [place], [place] Phone Number: 05/07/2020, 2:07 PM  Clinical Narrative:     CSW returned call to patient's spouse, [name] as requested. He inquired how the patient was doing and questions about SNF. CSW answered all questions and advised will have patient's RN to give him a call.    CSW updated RN  Thurmond Butts, MSW, LCSW Clinical Social Worker   Expected Discharge Plan: Skilled Nursing Facility Barriers to Discharge: Continued Medical Work up  Expected Discharge Plan and Services Expected Discharge Plan: [place] In-house Referral: Clinical Social Work                                             Social Determinants of Health (SDOH) Interventions    Readmission Risk Interventions Readmission Risk Prevention Plan 03/18/2020  Transportation Screening Complete  HRI or Home Care Consult Complete  SW Recovery Care/Counseling Consult Complete  Palliative Care Screening Not Applicable  Skilled Nursing Facility Complete  Some recent data might be hidden

## 2020-05-07 NOTE — Consult Note (Signed)
[place] Nurse wound follow up Wound type:Surgical. Vascular team following. Measurement:8.8cm x 4cm x 3cm  Wound bed:85% red, 10% yellow, 5% black eschar to wound edge.  Muscle tissue visible. Drainage (amount, consistency, odor) Small amount serosanguinous in tubing Periwound: intact Dressing procedure/placement/frequency: Dressing removed (1 piece of black foam) gently and patient tolerated well with no premedication. Holing narcotics at thsi time due to confusion. Wound cleansed with NS, and gently patted dry. A skin barrier ring is used to encircle 3/4 of the wound.  Wound filled with one piece of black foam, secured with drape and dressing attached to negative pressure at 126mHg continuous. An immediate seal is achieved. Patient tolerated procedure well. Dressing is labeled with the number of wound contact layers used and today's date. I was assisted in the dressing change today by the patient's bedside Rn, Katey.  Two Nursing students observed the dressing change.  Supplies in room for next dressing change: 1 dressing kit and 1 skin barrier ring.  WOlpenursing team will follow, and will remain available to this patient, the nursing and medical teams.  Please re-consult if needed. Thanks, LMaudie Flakes MSN, RN, [place] CArther Abbott Pager# [phone]

## 2020-05-07 NOTE — Progress Notes (Signed)
Physical Therapy Treatment Patient Details Name: [name] MRN: [id number] DOB: 12-10-1945 Today's Date: 05/07/2020    History of Present Illness Patient is a 75 y/o female who presents from SNF due to infected groin wound now s/p I&D right groin wound 1/17, s/p washout of wound and Right sartorius muscle flap coverage and wound vac application 4/97/02. MRI negative for acute changes on 05/05/20. PMH includes Lft fem to below knee pop bypass with saphenous vein, toe amputations, breast ca, left shoulder dislocation, CKD, CAD, STEMI, OA.    PT Comments    Patient received in bed, reports she just woke up. She is lethargic, but agreeable to PT session. Patient requires increased time and effort with bed mobility. Easily fatigued. Min assist needed to raise trunk to seated position. She is able to scoot to edge of bed independently. Patient unable to stand up with bed in low position. Min assist needed from elevated bed. Patient ambulated 5 feet with RW and min guard. Then sat on BSC. Assisted with hygiene needs. Then she was able to ambulate another 5 feet to recliner. Patient is very fatigued with this amount of activity. HR elevated into 110s. Patient will continue to benefit from skilled PT while here to improve functional independence, strength and activity tolerance.        Follow Up Recommendations  SNF;Supervision for mobility/OOB     Equipment Recommendations  None recommended by PT;Other (comment) (TBD)    Recommendations for Other Services       Precautions / Restrictions Precautions Precautions: Fall Precaution Comments: keep right heel offloaded per pt, wound vac, soft BP Restrictions Weight Bearing Restrictions: No Other Position/Activity Restrictions: toe weight bearing on right per pt.    Mobility  Bed Mobility Overal bed mobility: Needs Assistance Bed Mobility: Supine to Sit     Supine to sit: Min assist;HOB elevated     General bed mobility comments:  patient requires min assist to raise trunk to seated position. Able to scoot self to edge of bed. Increased time and effort.  Transfers Overall transfer level: Needs assistance Equipment used: Rolling walker (2 wheeled) Transfers: Sit to/from Stand Sit to Stand: Min assist;From elevated surface         General transfer comment: Min assist from elevated surface, mod assist from [facility]. Patient requires cues for safe hand placement. Requires counting to three to attempt standing.  Ambulation/Gait Ambulation/Gait assistance: Min assist Gait Distance (Feet): 10 Feet Assistive device: Rolling walker (2 wheeled) Gait Pattern/deviations: Step-to pattern;Decreased step length - right;Decreased step length - left;Decreased stride length;Trunk flexed Gait velocity: decreased   General Gait Details: patient very fatigued with ambulation. Requires multiple standing rest breaks during short ambulation distance. Patient wanted to walk to bathroom, but was unable to go that far. BSC brought up to patient after 5 feet.   Stairs             Wheelchair Mobility    Modified Rankin (Stroke Patients Only)       Balance Overall balance assessment: Needs assistance Sitting-balance support: Feet supported Sitting balance-Leahy Scale: Good Sitting balance - Comments: Supervision for safety.   Standing balance support: Bilateral upper extremity supported;During functional activity Standing balance-Leahy Scale: Fair Standing balance comment: Reliant on UE support                            Cognition Arousal/Alertness: Lethargic;Awake/alert Behavior During Therapy: WFL for tasks assessed/performed;Flat affect Overall Cognitive Status: Impaired/Different  from baseline Area of Impairment: Awareness;Orientation;Safety/judgement;Problem solving                 Orientation Level: Disoriented to;Time Current Attention Level: Sustained Memory: Decreased short-term  memory Following Commands: Follows one step commands with increased time;Follows one step commands inconsistently Safety/Judgement: Decreased awareness of safety;Decreased awareness of deficits Awareness: Intellectual Problem Solving: Slow processing;Requires verbal cues;Decreased initiation;Difficulty sequencing;Requires tactile cues General Comments: Patient continues to be lethargic, requiring increased time for activity and answering questions. Not oriented to day and stating this room looks much bigger.      Exercises      General Comments General comments (skin integrity, edema, etc.): HR elevated with ambulation      Pertinent Vitals/Pain Pain Location: sore throat Pain Descriptors / Indicators: Sore Pain Intervention(s): Other (comment) (patient requesting cough drop)    Home Living                      Prior Function            PT Goals (current goals can now be found in the care plan section) Acute Rehab PT Goals Patient Stated Goal: be able to take care of myself PT Goal Formulation: With patient Time For Goal Achievement: 05/14/20 Potential to Achieve Goals: Fair Progress towards PT goals: Progressing toward goals    Frequency    Min 3X/week      PT Plan Current plan remains appropriate    Co-evaluation              AM-PAC PT "6 Clicks" Mobility   Outcome Measure  Help needed turning from your back to your side while in a flat bed without using bedrails?: A Little Help needed moving from lying on your back to sitting on the side of a flat bed without using bedrails?: A Little Help needed moving to and from a bed to a chair (including a wheelchair)?: A Little Help needed standing up from a chair using your arms (e.g., wheelchair or bedside chair)?: A Lot Help needed to walk in hospital room?: A Lot Help needed climbing 3-5 steps with a railing? : Total 6 Click Score: 14    End of Session Equipment Utilized During Treatment: Gait  belt Activity Tolerance: Patient limited by lethargy;Patient limited by fatigue Patient left: in chair;with call bell/phone within reach;with chair alarm set Nurse Communication: Mobility status PT Visit Diagnosis: Unsteadiness on feet (R26.81);Muscle weakness (generalized) (M62.81);Difficulty in walking, not elsewhere classified (R26.2);Other abnormalities of gait and mobility (R26.89)     Time: 2841-3244 PT Time Calculation (min) (ACUTE ONLY): 23 min  Charges:  $Gait Training: 8-22 mins $Therapeutic Activity: 8-22 mins                     Machel Violante, PT, GCS 05/07/20,1:23 PM

## 2020-05-07 NOTE — Progress Notes (Signed)
[facility] CONSULTATION PROGRESS NOTE    Progress Note  [name]  SKA:[id number] DOB: 07-03-1945 DOA: 04/25/2020 PCP: Unk Pinto, MD     Brief Narrative:   [name] is an 75 y.o. female past medical history of end-stage renal disease on hemodialysis, status post right external iliac and common femoral enterectomy with right femoral to anterior tibial artery bypass insulin-dependent diabetes mellitus type 2, essential hypertension moderate pulmonary hypertension who came into the hospital for an infected right groin surgical wound on 04/23/2020 status post I&D and wound VAC started empirically on antibiotics by vascular surgery. We are consulted as over the last 2 days she became more sleepy with occasional confusion, likely due to polypharmacy once sedatives were held her mentation improved.  An MRI was done that was negative for acute CVA.  Assessment/Plan:   Acute metabolic encephalopathy likely due to polypharmacy/acute confusional state: MRI of the brain was negative for CVA, and that showed chronic ischemic changes. Continue statins. Speech evaluation was unremarkable. Therapy evaluated the patient and the recommended skilled nursing facility [facility] has been notified. Agitation continues to improve. Continue to avoid sedatives narcotics or benzodiazepines she is at high risk of acute confusional state (delirium) and is high risk of pneumonia. We will continue to follow along with you.  New onset right-sided pleural effusion: ED of the chest was done that showed a large right pleural effusion a small to moderate left pleural effusion with compressive hemoptysis, there is a subtotal collapse of the right lower lobe. She remains asymptomatic.  Right groin surgical wound infection: Continue further management for vascular.  IV antibiotics per vascular.  Insulin-dependent diabetes mellitus type 2:  Off insulin her hemoglobin A1c is 4.9. She should go home off  oral hypoglycemic agents.  History of pulmonary hypertension: The echo was ordered that showed an EF of 20%, grade 2 diastolic heart failure Pressures are elevated at 60 mmHg bilateral atrial enlargement.  Epistaxis: 2 days in a row, INR 1.3. Her hemoglobin today is 8.4. Continue conservative management. He is on aspirin 81 mg further management per vascular surgery.  RN Pressure Injury Documentation: Pressure Injury 04/14/2020 Buttocks Right;Left;Mid Stage 1 -  Intact skin with non-blanchable redness of a localized area usually over a bony prominence. redden and tender to touch (Active)  04/29/2020 1930  Location: Buttocks  Location Orientation: Right;Left;Mid  Staging: Stage 1 -  Intact skin with non-blanchable redness of a localized area usually over a bony prominence.  Wound Description (Comments): redden and tender to touch  Present on Admission:     Estimated body mass index is 21.76 kg/m as calculated from the following:   Height as of 04/20/20: 5\' 4"  (1.626 m).   Weight as of this encounter: 57.5 kg.   We will continue to follow along with you.  DVT prophylaxis: Heparin Family Communication: None Status is: Inpatient  Remains inpatient appropriate because:Hemodynamically unstable    Code Status:     Code Status Orders  (From admission, onward)         Start     Ordered   04/23/20 0754  Full code  Continuous        04/23/20 0753        Code Status History    Date Active Date Inactive Code Status Order ID Comments User Context   03/16/2020 1404 03/27/2020 2121 Full Code [id number]  Iline Oven Inpatient   02/24/2020 1445 02/24/2020 2157 Full Code [id number]  Cherre Robins, MD Inpatient  04/07/2019 1420 04/20/2019 2040 Full Code [id number]  Elizabeth Sauer Inpatient   04/07/2019 1420 04/07/2019 1420 Full Code [id number]  Elizabeth Sauer Inpatient   03/23/2019 1724 04/07/2019 1418 Full Code [id number]  Lequita Halt, MD ED    11/26/2017 1456 12/04/2017 1733 Full Code [id number]  Reyne Dumas, MD ED   11/03/2017 1122 11/05/2017 1814 DNR [id number]  Velna Ochs, MD ED   05/04/2017 0048 05/07/2017 1608 Full Code [id number]  Jani Gravel, MD Inpatient   06/04/2013 1656 06/08/2013 1954 Full Code [id number]  Barton Dubois, MD Inpatient   04/20/2013 1522 04/21/2013 1727 Full Code [id number]  Rolm Bookbinder, MD Inpatient   08/27/2012 1803 08/28/2012 1920 Full Code [id number]  Newt Minion, MD Inpatient   Advance Care Planning Activity        IV Access:    Peripheral IV   Procedures and diagnostic studies:   MR BRAIN WO CONTRAST  Result Date: 05/05/2020 CLINICAL DATA:  TIA.  ESRD. EXAM: MRI HEAD WITHOUT CONTRAST TECHNIQUE: Multiplanar, multiecho pulse sequences of the brain and surrounding structures were obtained without intravenous contrast. COMPARISON:  CT head 05/03/2020 FINDINGS: Brain: Negative for acute infarct. Generalized atrophy. Patchy white matter hyperintensity bilaterally compatible with chronic ischemia. 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 Negative for hemorrhage or mass. Vascular: Normal arterial flow voids. Skull and upper cervical spine: Negative Sinuses/Orbits: Complete opacification of the right maxillary sinus. Remaining sinuses clear. Bilateral cataract extraction Other: None IMPRESSION: No acute abnormality Chronic microvascular ischemic change in the white matter and cerebellum bilaterally. Electronically Signed   By: Franchot Gallo M.D.   On: 05/05/2020 15:28     Medical Consultants:    None.  Anti-Infectives:   Cefepime  Subjective:    [name] relates she still feels weak had an episode of epistaxis overnight.  Objective:    Vitals:   05/06/20 2026 05/07/20 0110 05/07/20 0400 05/07/20 0810  BP: (!) 123/47 (!) 127/45 (!) 116/55 (!) 123/54  Pulse: (!) 58 97 96 92  Resp: 20 15 16 15   Temp: 97.6 F (36.4 C) 97.7 F (36.5 C) 98 F (36.7 C) 98.1 F (36.7 C)   TempSrc: Oral Oral Oral Oral  SpO2: 100% 95% 94% 100%  Weight:       SpO2: 100 % O2 Flow Rate (L/min): 2 L/min  No intake or output data in the 24 hours ending 05/07/20 0954 Filed Weights   05/05/20 0805 05/05/20 1050 05/06/20 0711  Weight: 61.1 kg 59.5 kg 57.5 kg    Exam: General exam: In no acute distress. Respiratory system: Good air movement and clear to auscultation. Cardiovascular system: S1 & S2 heard, RRR. No JVD. Gastrointestinal system: Abdomen is nondistended, soft and nontender.  Extremities: No pedal edema. Skin: No rashes, lesions or ulcers Psychiatry: Judgement and insight appear normal. Mood & affect appropriate.  Data Reviewed:    Labs: Basic Metabolic Panel: Recent Labs  Lab 05/03/20 1025 05/05/20 0909  NA 137 134*  K 4.9 4.7  CL 100 99  CO2 20* 20*  GLUCOSE 84 95  BUN 64* 58*  CREATININE 7.12* 6.80*  CALCIUM 7.9* 7.8*  PHOS 6.2* 6.3*   GFR Estimated Creatinine Clearance: 6.3 mL/min (A) (by C-G formula based on SCr of 6.8 mg/dL (H)). Liver Function Tests: Recent Labs  Lab 05/03/20 1025 05/05/20 0909  ALBUMIN 2.1* 2.0*   No results for input(s): LIPASE, AMYLASE in the last 168 hours. No results for input(s): AMMONIA in the last 168  hours. Coagulation profile Recent Labs  Lab 05/06/20 0625  INR 1.3*   COVID-19 Labs  No results for input(s): DDIMER, FERRITIN, LDH, CRP in the last 72 hours.  Lab Results  Component Value Date   SARSCOV2NAA NEGATIVE 05/03/2020   [place] NEGATIVE 05/06/2020   [place] NEGATIVE 03/26/2020   [place] NEGATIVE 03/13/2020    CBC: Recent Labs  Lab 05/03/20 0242 05/04/20 0237 05/05/20 0208 05/06/20 0305 05/07/20 0142  WBC 8.1 8.0 9.0 8.8 9.0  HGB 9.2* 9.1* 9.8* 9.8* 8.4*  HCT 28.1* 27.9* 30.1* 29.9* 26.0*  MCV 96.6 97.2 98.4 99.0 98.9  PLT 184 170 181 187 185   Cardiac Enzymes: No results for input(s): CKTOTAL, CKMB, CKMBINDEX, TROPONINI in the last 168 hours. BNP (last 3  results) No results for input(s): PROBNP in the last 8760 hours. CBG: Recent Labs  Lab 05/06/20 0616 05/06/20 1200 05/06/20 1638 05/06/20 2138 05/07/20 0529  GLUCAP 75 121* 124* 121* 130*   D-Dimer: No results for input(s): DDIMER in the last 72 hours. Hgb A1c: Recent Labs    05/05/20 0208  HGBA1C 4.9   Lipid Profile: Recent Labs    05/05/20 0208  CHOL 99  HDL 32*  LDLCALC 47  TRIG 102  CHOLHDL 3.1   Thyroid function studies: No results for input(s): TSH, T4TOTAL, T3FREE, THYROIDAB in the last 72 hours.  Invalid input(s): FREET3 Anemia work up: No results for input(s): VITAMINB12, FOLATE, FERRITIN, TIBC, IRON, RETICCTPCT in the last 72 hours. Sepsis Labs: Recent Labs  Lab 05/03/20 1728 05/03/20 2048 05/04/20 0237 05/05/20 0208 05/06/20 0305 05/07/20 0142  WBC  --   --  8.0 9.0 8.8 9.0  LATICACIDVEN 0.8 0.9  --   --   --   --    Microbiology Recent Results (from the past 240 hour(s))  SARS Coronavirus 2 by RT PCR (hospital order, performed in [place] hospital lab) Nasopharyngeal Nasopharyngeal Swab     Status: None   Collection Time: 05/03/20  6:01 PM   Specimen: Nasopharyngeal Swab  Result Value Ref Range Status   SARS Coronavirus 2 NEGATIVE NEGATIVE Final    Comment: (NOTE) SARS-CoV-2 target nucleic acids are NOT DETECTED.  The SARS-CoV-2 RNA is generally detectable in upper and lower respiratory specimens during the acute phase of infection. The lowest concentration of SARS-CoV-2 viral copies this assay can detect is 250 copies / mL. A negative result does not preclude SARS-CoV-2 infection and should not be used as the sole basis for treatment or other patient management decisions.  A negative result may occur with improper specimen collection / handling, submission of specimen other than nasopharyngeal swab, presence of viral mutation(s) within the areas targeted by this assay, and inadequate number of viral copies (<250 copies / mL). A  negative result must be combined with clinical observations, patient history, and epidemiological information.  Fact Sheet for Patients:   StrictlyIdeas.no  Fact Sheet for Healthcare Providers: BankingDealers.co.za  This test is not yet approved or  cleared by the [place] FDA and has been authorized for detection and/or diagnosis of SARS-CoV-2 by FDA under an Emergency Use Authorization (EUA).  This EUA will remain in effect (meaning this test can be used) for the duration of the COVID-19 declaration under Section 564(b)(1) of the Act, 21 U.S.C. section 360bbb-3(b)(1), unless the authorization is terminated or revoked sooner.  Performed at [facility], [place] [address] [place], [place] [address]   Culture, blood (routine x 2)     Status:  None (Preliminary result)   Collection Time: 05/03/20  8:30 PM   Specimen: BLOOD  Result Value Ref Range Status   Specimen Description BLOOD THUMB  Final   Special Requests   Final    AEROBIC BOTTLE ONLY Blood Culture results may not be optimal due to an inadequate volume of blood received in culture bottles   Culture   Final    NO GROWTH 2 DAYS Performed at [facility], [place] [address], [place], [place] [address]    Report Status PENDING  Incomplete  Culture, blood (routine x 2)     Status: None (Preliminary result)   Collection Time: 05/03/20  8:50 PM   Specimen: BLOOD LEFT HAND  Result Value Ref Range Status   Specimen Description BLOOD LEFT HAND  Final   Special Requests   Final    AEROBIC BOTTLE ONLY Blood Culture results may not be optimal due to an inadequate volume of blood received in culture bottles   Culture   Final    NO GROWTH 2 DAYS Performed at [facility], [place] [address] [place] [address] [place], [place] [address]    Report Status PENDING  Incomplete     Medications:   . allopurinol  100 mg Oral BID  . aspirin EC  81 mg Oral Daily  . atorvastatin  10 mg Oral  Daily  . Chlorhexidine Gluconate Cloth  6 each Topical Q0600  . cinacalcet  30 mg Oral Q supper  . collagenase   Topical Daily  . darbepoetin (ARANESP) injection - DIALYSIS  60 mcg Intravenous Q Thu-HD  . docusate sodium  100 mg Oral BID  . feeding supplement (NEPRO CARB STEADY)   Oral BID  . hydrALAZINE  50 mg Oral TID  . isosorbide dinitrate  10 mg Oral TID  . melatonin  3 mg Oral QHS  . multivitamin  1 tablet Oral QHS  . pantoprazole  40 mg Oral Daily  . sevelamer carbonate  1,600 mg Oral TID WC   Continuous Infusions: . ceFEPime (MAXIPIME) IV 1 g (05/06/20 1621)      LOS: 14 days   Charlynne Cousins  [facility]  05/07/2020, 9:54 AM

## 2020-05-08 ENCOUNTER — Ambulatory Visit: Payer: Medicare Other | Admitting: Adult Health

## 2020-05-08 LAB — CBC
HCT: 22.4 % — ABNORMAL LOW (ref 36.0–46.0)
Hemoglobin: 7.6 g/dL — ABNORMAL LOW (ref 12.0–15.0)
MCH: 33 pg (ref 26.0–34.0)
MCHC: 33.9 g/dL (ref 30.0–36.0)
MCV: 97.4 fL (ref 80.0–100.0)
Platelets: 158 10*3/uL (ref 150–400)
RBC: 2.3 MIL/uL — ABNORMAL LOW (ref 3.87–5.11)
RDW: 20.7 % — ABNORMAL HIGH (ref 11.5–15.5)
WBC: 8.7 10*3/uL (ref 4.0–10.5)
nRBC: 1.5 % — ABNORMAL HIGH (ref 0.0–0.2)

## 2020-05-08 LAB — GLUCOSE, CAPILLARY
Glucose-Capillary: 78 mg/dL (ref 70–99)
Glucose-Capillary: 83 mg/dL (ref 70–99)
Glucose-Capillary: 101 mg/dL — ABNORMAL HIGH (ref 70–99)
Glucose-Capillary: 106 mg/dL — ABNORMAL HIGH (ref 70–99)

## 2020-05-08 MED ORDER — DARBEPOETIN ALFA 100 MCG/0.5ML IJ SOSY
100.0000 ug | PREFILLED_SYRINGE | INTRAMUSCULAR | Status: DC
  Administered 2020-05-10: 100 ug via INTRAVENOUS
  Filled 2020-05-08: qty 0.5

## 2020-05-08 MED ORDER — SEVELAMER CARBONATE 0.8 G PO PACK
1.6000 g | PACK | Freq: Three times a day (TID) | ORAL | Status: DC
  Administered 2020-05-08 – 2020-05-11 (×3): 1.6 g via ORAL
  Filled 2020-05-08 (×15): qty 2

## 2020-05-08 NOTE — Progress Notes (Addendum)
[facility] Progress Note   Subjective:   Alert, eating breakfast. Reports feeling "groggy" but no other concerns. Denies SOB, CP, palpitations, abdominal pain and nausea.   Objective Vitals:   05/07/20 1832 05/08/20 0100 05/08/20 0600 05/08/20 0829  BP: (!) 114/52 122/72 110/62 (!) 103/52  Pulse: 84 (!) 50 99   Resp: 13 (!) 21  17  Temp:  98 F (36.7 C) 98.3 F (36.8 C)   TempSrc: Oral  Oral   SpO2: 100% 100%  100%  Weight:       Physical Exam General: Well developed, frail appearing female in NAD. Heart: RRR, no murmurs, rubs or gallops Lungs: CTA bilaterally without wheezing, rhonchi or rales Abdomen: Soft, non-distended, +BS Extremities: No edema b/l lower extremities Dialysis Access: RUE AVF + bruit  Additional Objective Labs: Basic Metabolic Panel: Recent Labs  Lab 05/03/20 1025 05/05/20 0909 05/07/20 1509  NA 137 134* 133*  K 4.9 4.7 4.1  CL 100 99 98  CO2 20* 20* 19*  GLUCOSE 84 95 140*  BUN 64* 58* 64*  CREATININE 7.12* 6.80* 7.24*  CALCIUM 7.9* 7.8* 8.4*  PHOS 6.2* 6.3* 5.3*   Liver Function Tests: Recent Labs  Lab 05/03/20 1025 05/05/20 0909 05/07/20 1509  ALBUMIN 2.1* 2.0* 2.1*   CBC: Recent Labs  Lab 05/04/20 0237 05/05/20 0208 05/06/20 0305 05/07/20 0142 05/08/20 0038  WBC 8.0 9.0 8.8 9.0 8.7  HGB 9.1* 9.8* 9.8* 8.4* 7.6*  HCT 27.9* 30.1* 29.9* 26.0* 22.4*  MCV 97.2 98.4 99.0 98.9 97.4  PLT 170 181 187 185 158   Blood Culture    Component Value Date/Time   SDES BLOOD LEFT HAND 05/03/2020 2050   SPECREQUEST  05/03/2020 2050    AEROBIC BOTTLE ONLY Blood Culture results may not be optimal due to an inadequate volume of blood received in culture bottles   CULT  05/03/2020 2050    NO GROWTH 3 DAYS Performed at [place] [address] [place], [place] [address]    REPTSTATUS PENDING 05/03/2020 2050   CBG: Recent Labs  Lab 05/07/20 0529 05/07/20 1055 05/07/20 1905 05/07/20 2154 05/08/20 0614   GLUCAP 130* 132* 127* 96 78   Medications: . ceFEPime (MAXIPIME) IV Stopped (05/07/20 1432)   . allopurinol  100 mg Oral BID  . aspirin EC  81 mg Oral Daily  . atorvastatin  10 mg Oral Daily  . Chlorhexidine Gluconate Cloth  6 each Topical Q0600  . Chlorhexidine Gluconate Cloth  6 each Topical Q0600  . cinacalcet  30 mg Oral Q supper  . collagenase   Topical Daily  . darbepoetin (ARANESP) injection - DIALYSIS  60 mcg Intravenous Q Thu-HD  . docusate sodium  100 mg Oral BID  . feeding supplement (NEPRO CARB STEADY)   Oral BID  . hydrALAZINE  50 mg Oral TID  . isosorbide dinitrate  10 mg Oral TID  . melatonin  3 mg Oral QHS  . multivitamin  1 tablet Oral QHS  . pantoprazole  40 mg Oral Daily  . sevelamer carbonate  1.6 g Oral TID WC    Dialysis Orders: NWon TTS MCN47.0 kgHD Bath 2K 2Ca 3 hours 45 minutes,no heparin. AccessRUA AVF Mircera 30 MCG every 2 weeks last on 01/13 units IV/HD Venofer100 mg loading needs 9 more doses  Assessment/Plan: 1. 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 S/pinitial debridement1/17, thenwashout 1/18 by Dr. Donzetta Matters.Per [facility]. 2. AMS: W/U per primary/Neuro. No acute abnormalities  on MRI. Avoid sedating medications. Slightly improved today. Has not missed HD, not uremic. 3. ESRD: Usually TTS schedule, had HD off schedule yesterday. Can likely resume TTS schedule on Thursday, will reevaluate tomorrow AM.  4. Hypertension/volume-Antihypertensivemeds on hold.Does not appear grossly volume overloaded.UF as tolerated.Large pleural effusion on imaging, will try to keep volume down with HD.  5. Anemiaof CKD: Hgb9.8 > 8.4 > 7.6, no bleeding reported,continue Aranespq Thursday(lastdose 60 mcg IV1/27) Dose increased for Thursday. HoldingIViron load with infection. 6. Metabolic bone disease -Corrected calcium and phos within goal. Continue Renvela binder, Sensipar 30 daily 7. Nutrition - renal diet with  fluid restrictions, vitamins, Nepro 8. History of CAD 9. History of systolic/diastolic heart failure 10. Epistaxis: Likely cause of Hgb decline though no further bleeding reported today. INR 1.3, plts ok. Per primary. No heparin with HD.   [name], PA-C 05/08/2020, 9:35 AM  [facility] Pager: [phone]  I have seen and examined this patient and agree with plan and assessment in the above note with renal recommendations/intervention highlighted.  Will get back on outpatient schedule this week.  Awaiting SNF placement.  Broadus John A Thuan Tippett,MD 05/08/2020 11:16 AM

## 2020-05-08 NOTE — Progress Notes (Addendum)
[facility]  Subjective  - alert and oriented this am.   Objective 110/62 99 98.3 F (36.8 C) (Oral) (!) 21 100%  Intake/Output Summary (Last 24 hours) at 05/08/2020 0726 Last data filed at 05/08/2020 0037 Gross per 24 hour  Intake 200 ml  Output 1057 ml  Net -857 ml   Right groin to suction with good seal Doppler signals DP/PT B LE Right LE incision fully healed Right Lateral foot wound clean, heel wound basically healed Lungs non labored breathing Heart irregular   Assessment/Planning:  POD # 13 rightgroin sartorius muscle flapsecondary to right groin wound infection.She is now POD 4from initial wash-out and debridement.  She is seven weeks post-op right EIA and rightcommon femoral endarterectomy with Dacron patch angioplasty, right femoral to anterior tibial bypass with 6 mm ringed propatent Gore-Tex graft. 14 Days Post-Op  Bypass inflow with doppler signals intact right LE Vac change tomorrow with WOC RN Pending SNF       Roxy Horseman 05/08/2020 7:26 AM --  Laboratory Lab Results: Recent Labs    05/07/20 0142 05/08/20 0038  WBC 9.0 8.7  HGB 8.4* 7.6*  HCT 26.0* 22.4*  PLT 185 158   BMET Recent Labs    05/05/20 0909 05/07/20 1509  NA 134* 133*  K 4.7 4.1  CL 99 98  CO2 20* 19*  GLUCOSE 95 140*  BUN 58* 64*  CREATININE 6.80* 7.24*  CALCIUM 7.8* 8.4*    COAG Lab Results  Component Value Date   INR 1.3 (H) 05/06/2020   INR 1.6 (H) 04/20/2020   INR 1.2 03/14/2020   No results found for: PTT  I have examined the patient, reviewed and agree with above.  Will attempt to be available for [facility] change tomorrow.  Difficult problem with chronic disease, end-stage renal disease and severe deconditioning.  Husband not present today.  May need to discuss goals of care at some point  Curt Jews, MD 05/08/2020 10:35 AM

## 2020-05-08 DEATH — deceased

## 2020-05-09 ENCOUNTER — Ambulatory Visit: Payer: Medicare Other | Admitting: Adult Health

## 2020-05-09 DIAGNOSIS — Z9889 Other specified postprocedural states: Secondary | ICD-10-CM | POA: Diagnosis not present

## 2020-05-09 DIAGNOSIS — J90 Pleural effusion, not elsewhere classified: Secondary | ICD-10-CM

## 2020-05-09 DIAGNOSIS — G9341 Metabolic encephalopathy: Secondary | ICD-10-CM | POA: Diagnosis not present

## 2020-05-09 DIAGNOSIS — R404 Transient alteration of awareness: Secondary | ICD-10-CM | POA: Diagnosis not present

## 2020-05-09 LAB — RENAL FUNCTION PANEL
Albumin: 2.1 g/dL — ABNORMAL LOW (ref 3.5–5.0)
Anion gap: 17 — ABNORMAL HIGH (ref 5–15)
BUN: 68 mg/dL — ABNORMAL HIGH (ref 8–23)
CO2: 19 mmol/L — ABNORMAL LOW (ref 22–32)
Calcium: 8.7 mg/dL — ABNORMAL LOW (ref 8.9–10.3)
Chloride: 99 mmol/L (ref 98–111)
Creatinine, Ser: 8.08 mg/dL — ABNORMAL HIGH (ref 0.44–1.00)
GFR, Estimated: 5 mL/min — ABNORMAL LOW (ref >60)
Glucose, Bld: 96 mg/dL (ref 70–99)
Phosphorus: 5.4 mg/dL — ABNORMAL HIGH (ref 2.5–4.6)
Potassium: 4.7 mmol/L (ref 3.5–5.1)
Sodium: 135 mmol/L (ref 135–145)

## 2020-05-09 LAB — GLUCOSE, CAPILLARY
Glucose-Capillary: 105 mg/dL — ABNORMAL HIGH (ref 70–99)
Glucose-Capillary: 94 mg/dL (ref 70–99)
Glucose-Capillary: 98 mg/dL (ref 70–99)
Glucose-Capillary: 92 mg/dL (ref 70–99)

## 2020-05-09 LAB — CULTURE, BLOOD (ROUTINE X 2)
Culture: NO GROWTH
Culture: NO GROWTH

## 2020-05-09 LAB — CBC
HCT: 24.6 % — ABNORMAL LOW (ref 36.0–46.0)
Hemoglobin: 7.9 g/dL — ABNORMAL LOW (ref 12.0–15.0)
MCH: 32.5 pg (ref 26.0–34.0)
MCHC: 32.1 g/dL (ref 30.0–36.0)
MCV: 101.2 fL — ABNORMAL HIGH (ref 80.0–100.0)
Platelets: 171 10*3/uL (ref 150–400)
RBC: 2.43 MIL/uL — ABNORMAL LOW (ref 3.87–5.11)
RDW: 21.4 % — ABNORMAL HIGH (ref 11.5–15.5)
WBC: 11.9 10*3/uL — ABNORMAL HIGH (ref 4.0–10.5)
nRBC: 1.2 % — ABNORMAL HIGH (ref 0.0–0.2)

## 2020-05-09 MED ORDER — FLEET ENEMA 7-19 GM/118ML RE ENEM: 1.0000 | ENEMA | Freq: Every day | RECTAL | Status: DC | PRN

## 2020-05-09 MED ORDER — CHLORHEXIDINE GLUCONATE CLOTH 2 % EX PADS
6.0000 | MEDICATED_PAD | Freq: Every day | CUTANEOUS | Status: DC
  Administered 2020-05-10: 6 via TOPICAL

## 2020-05-09 MED ORDER — WHITE PETROLATUM EX OINT
TOPICAL_OINTMENT | CUTANEOUS | Status: AC
  Filled 2020-05-09: qty 28.35

## 2020-05-09 MED ORDER — ACETAMINOPHEN 325 MG PO TABS
650.0000 mg | ORAL_TABLET | Freq: Four times a day (QID) | ORAL | Status: DC | PRN
  Administered 2020-05-09: 650 mg via ORAL
  Filled 2020-05-09: qty 2

## 2020-05-09 NOTE — Progress Notes (Addendum)
[facility]  Subjective  - Feels bad and has not been able to produce a BM.   Objective (!) 119/48 96 97.6 F (36.4 C) (Oral) 16 100%  Intake/Output Summary (Last 24 hours) at 05/09/2020 0740 Last data filed at 05/09/2020 0554 Gross per 24 hour  Intake 120 ml  Output 160 ml  Net -40 ml    Doppler signals DP/PT right foot Lateral foot wound has decreased in size Right groin wound vac changed   Firmness inferior to wound without expansion Moving LE well  Assessment/Planning: POD# 14rightgroin sartorius muscle flapsecondary to right groin wound infection.She is now POD 49from initial wash-out and debridement.  She is seven weeks post-op right EIA and rightcommon femoral endarterectomy with Dacron patch angioplasty, right femoral to anterior tibial bypass with 6 mm ringed propatent Gore-Tex graft. 15Days Post-Op  WOC RN will replace vac at bed side DR. Fleeta Kunde has plans to discuss goal of care with her sister today Plan for Enema today to assist with BM She did eat lunch yesterday with encouragement.   Leukocytosis today with a WBC of 11.9   Roxy Horseman 05/09/2020 7:40 AM --  Laboratory Lab Results: Recent Labs    05/08/20 0038 05/09/20 0131  WBC 8.7 11.9*  HGB 7.6* 7.9*  HCT 22.4* 24.6*  PLT 158 171   BMET Recent Labs    05/07/20 1509  NA 133*  K 4.1  CL 98  CO2 19*  GLUCOSE 140*  BUN 64*  CREATININE 7.24*  CALCIUM 8.4*    COAG Lab Results  Component Value Date   INR 1.3 (H) 05/06/2020   INR 1.6 (H) 04/12/2020   INR 1.2 03/14/2020   No results found for: PTT   I have examined the patient, reviewed and agree with above.  The patient has a good healing of her right groin muscle.  No evidence of graft infection.  She has excellent flow to her right foot and is completely healed the heel ulcer and has good healing of the lateral foot ulceration.  My main concern is her continued overall clinical  decline.  She has multiple issues including end-stage renal disease and severe malnutrition.  I am not sure she has the adequate reserve to recover.  I discussed this in person with her sister present and also with her husband by telephone.  He is recovering from mild Covid infection.  I have recommended consultation with hospice palliative care to discuss goals of care.  Both agree with this plan.  Curt Jews, MD 05/09/2020 1:54 PM

## 2020-05-09 NOTE — Progress Notes (Signed)
PROGRESS NOTE    [name]  TZG:[id number] DOB: [birth year] DOA: 04/19/2020 PCP: Unk Pinto, MD   Brief Narrative:  75 year old with history of ESRD on hemodialysis, status post right external iliac and common femoral enterectomy with right femoral to anterior tibial artery bypass, insulin-dependent diabetes mellitus, type II, essential hypertension, moderate pulmonary pretension, presented with infected right groin surgical wound on 04/23/2020 status post I&D and wound VAC placement.  She was started on antibiotics by vascular surgery.  TRH consulted for acute metabolic encephalopathy. Assessment & Plan   Acute metabolic encephalopathy versus acute delirium -Possibly due to polypharmacy -MRI obtained and was unremarkable for CVA.  It did show chronic ischemic changes -Speech consulted, evaluation was unremarkable -PT recommending SNF -Patient currently alert and oriented x3  Right-sided pleural effusion CT chest showed large right pleural effusion, small to moderate left pleural effusion with compressive atelectasis.  Subtotal collapse of the right lower lobe.  No superimposed focal pulmonary infiltrate. -Currently asymptomatic  Right groin surgical wound infection -Per primary, vascular surgery  Diabetes mellitus, type II -Hemoglobin A1c 4.9 -Patient likely should go home on no oral hypoglycemic agents  History of pulmonary hypertension -Echocardiogram showed an EF 50 to 55%.  LV demonstrates regional wall motion abnormalities.  Grade 2 diastolic dysfunction.  RV systolic pressure 75.91 mmHg -Continue to monitor   Epistaxis -Hemoglobin 7.9, continue to monitor CBC -INR on 05/06/20 was 1.3  ESRD -Patient dialyzes Tuesday, Thursday, Saturday -Nephrology consulted and appreciated   Stage I pressure injury  Pressure Injury 04/10/2020 Buttocks Right;Left;Mid Stage 1 -  Intact skin with non-blanchable redness of a localized area usually over a bony prominence. redden and  tender to touch (Active)  04/23/2020 1930  Location: Buttocks  Location Orientation: Right;Left;Mid  Staging: Stage 1 -  Intact skin with non-blanchable redness of a localized area usually over a bony prominence.  Wound Description (Comments): redden and tender to touch  Present on Admission:     DVT Prophylaxis  per primary  Code Status: Full  Family Communication: None at bedside  Disposition Plan:  Status is: Inpatient  Remains inpatient appropriate because:Inpatient level of care appropriate due to severity of illness   Dispo: Per primary   Consultants [facility]  Procedures  Echocardiogram  Antibiotics   Anti-infectives (From admission, onward)   Start     Dose/Rate Route Frequency Ordered Stop   04/25/20 1430  ceFEPIme (MAXIPIME) 1 g in sodium chloride 0.9 % 100 mL IVPB        1 g 200 mL/hr over 30 Minutes Intravenous Every 24 hours 04/25/20 1334     04/27/2020 1200  vancomycin (VANCOCIN) IVPB 750 mg/150 ml premix  Status:  Discontinued        750 mg 150 mL/hr over 60 Minutes Intravenous Every T-Th-Sa (Hemodialysis) 04/23/20 0145 04/25/20 1334   04/10/2020 1100  vancomycin (VANCOREADY) IVPB 1250 mg/250 mL        1,250 mg 166.7 mL/hr over 90 Minutes Intravenous  Once 04/23/2020 1003 04/07/2020 1243   04/23/20 0800  piperacillin-tazobactam (ZOSYN) IVPB 3.375 g  Status:  Discontinued        3.375 g 12.5 mL/hr over 240 Minutes Intravenous Every 8 hours 04/23/20 0753 04/23/20 0759   04/23/20 0800  piperacillin-tazobactam (ZOSYN) IVPB 2.25 g  Status:  Discontinued        2.25 g 100 mL/hr over 30 Minutes Intravenous Every 8 hours 04/23/20 0800 04/25/20 1334   04/23/20 0200  vancomycin (VANCOREADY) IVPB 1250 mg/250 mL  Status:  Discontinued        1,250 mg 166.7 mL/hr over 90 Minutes Intravenous  Once 04/23/20 0145 04/26/2020 1003      Subjective:   Kareem Aul seen and examined today.  Patient states she has not been feeling well but cannot expand further on this.   Denies chest pain or shortness of breath, abdominal pain, nausea or vomiting, dizziness or headache.  Objective:   Vitals:   05/09/20 0345 05/09/20 0536 05/09/20 0807 05/09/20 1141  BP: (!) 119/48  (!) 124/48 112/66  Pulse: 96   61  Resp: 17 16 14 15   Temp: 97.6 F (36.4 C)  97.6 F (36.4 C) 97.8 F (36.6 C)  TempSrc: Oral  Oral Oral  SpO2: 100%  98% 99%  Weight:  58 kg      Intake/Output Summary (Last 24 hours) at 05/09/2020 1335 Last data filed at 05/09/2020 0554 Gross per 24 hour  Intake 120 ml  Output 160 ml  Net -40 ml   Filed Weights   05/07/20 1540 05/07/20 1755 05/09/20 0536  Weight: 59.8 kg 58.8 kg 58 kg    Exam  General: Well developed, acutely/chronically ill-appearing, NAD  HEENT: NCAT, mucous membranes moist.   Cardiovascular: S1 S2 auscultated, RRR.  Respiratory: Clear to auscultation bilaterally with equal chest rise  Abdomen: Soft, nontender, nondistended, + bowel sounds  Extremities: warm dry without cyanosis clubbing or edema  Neuro: AAOx3, nonfocal  Psych: flat   Data Reviewed: I have personally reviewed following labs and imaging studies  CBC: Recent Labs  Lab 05/05/20 0208 05/06/20 0305 05/07/20 0142 05/08/20 0038 05/09/20 0131  WBC 9.0 8.8 9.0 8.7 11.9*  HGB 9.8* 9.8* 8.4* 7.6* 7.9*  HCT 30.1* 29.9* 26.0* 22.4* 24.6*  MCV 98.4 99.0 98.9 97.4 101.2*  PLT 181 187 185 158 443   Basic Metabolic Panel: Recent Labs  Lab 05/03/20 1025 05/05/20 0909 05/07/20 1509 05/09/20 1056  NA 137 134* 133* 135  K 4.9 4.7 4.1 4.7  CL 100 99 98 99  CO2 20* 20* 19* 19*  GLUCOSE 84 95 140* 96  BUN 64* 58* 64* 68*  CREATININE 7.12* 6.80* 7.24* 8.08*  CALCIUM 7.9* 7.8* 8.4* 8.7*  PHOS 6.2* 6.3* 5.3* 5.4*   GFR: Estimated Creatinine Clearance: 5.3 mL/min (A) (by C-G formula based on SCr of 8.08 mg/dL (H)). Liver Function Tests: Recent Labs  Lab 05/03/20 1025 05/05/20 0909 05/07/20 1509 05/09/20 1056  ALBUMIN 2.1* 2.0* 2.1* 2.1*   No  results for input(s): LIPASE, AMYLASE in the last 168 hours. No results for input(s): AMMONIA in the last 168 hours. Coagulation Profile: Recent Labs  Lab 05/06/20 0625  INR 1.3*   Cardiac Enzymes: No results for input(s): CKTOTAL, CKMB, CKMBINDEX, TROPONINI in the last 168 hours. BNP (last 3 results) No results for input(s): PROBNP in the last 8760 hours. HbA1C: No results for input(s): HGBA1C in the last 72 hours. CBG: Recent Labs  Lab 05/08/20 1640 05/08/20 2103 05/09/20 0607 05/09/20 1131 05/09/20 1140  GLUCAP 101* 106* 105* 98 94   Lipid Profile: No results for input(s): CHOL, HDL, LDLCALC, TRIG, CHOLHDL, LDLDIRECT in the last 72 hours. Thyroid Function Tests: No results for input(s): TSH, T4TOTAL, FREET4, T3FREE, THYROIDAB in the last 72 hours. Anemia Panel: No results for input(s): VITAMINB12, FOLATE, FERRITIN, TIBC, IRON, RETICCTPCT in the last 72 hours. Urine analysis:    Component Value Date/Time   COLORURINE DARK YELLOW 02/13/2020 1518   APPEARANCEUR TURBID (A) 02/13/2020 1518  LABSPEC 1.015 02/13/2020 1518   PHURINE 6.5 02/13/2020 1518   GLUCOSEU NEGATIVE 02/13/2020 1518   HGBUR 2+ (A) 02/13/2020 1518   BILIRUBINUR NEGATIVE 08/25/2018 0045   KETONESUR NEGATIVE 02/13/2020 1518   PROTEINUR 3+ (A) 02/13/2020 1518   UROBILINOGEN 0.2 05/30/2009 1500   NITRITE NEGATIVE 02/13/2020 1518   LEUKOCYTESUR 3+ (A) 02/13/2020 1518   Sepsis Labs: @LABRCNTIP (procalcitonin:4,lacticidven:4)  ) Recent Results (from the past 240 hour(s))  SARS Coronavirus 2 by RT PCR (hospital order, performed in [facility] hospital lab) Nasopharyngeal Nasopharyngeal Swab     Status: None   Collection Time: 05/03/20  6:01 PM   Specimen: Nasopharyngeal Swab  Result Value Ref Range Status   SARS Coronavirus 2 NEGATIVE NEGATIVE Final    Comment: (NOTE) SARS-CoV-2 target nucleic acids are NOT DETECTED.  The SARS-CoV-2 RNA is generally detectable in upper and lower respiratory  specimens during the acute phase of infection. The lowest concentration of SARS-CoV-2 viral copies this assay can detect is 250 copies / mL. A negative result does not preclude SARS-CoV-2 infection and should not be used as the sole basis for treatment or other patient management decisions.  A negative result may occur with improper specimen collection / handling, submission of specimen other than nasopharyngeal swab, presence of viral mutation(s) within the areas targeted by this assay, and inadequate number of viral copies (<250 copies / mL). A negative result must be combined with clinical observations, patient history, and epidemiological information.  Fact Sheet for Patients:   StrictlyIdeas.no  Fact Sheet for Healthcare Providers: BankingDealers.co.za  This test is not yet approved or  cleared by the [place] FDA and has been authorized for detection and/or diagnosis of SARS-CoV-2 by FDA under an Emergency Use Authorization (EUA).  This EUA will remain in effect (meaning this test can be used) for the duration of the COVID-19 declaration under Section 564(b)(1) of the Act, 21 U.S.C. section 360bbb-3(b)(1), unless the authorization is terminated or revoked sooner.  Performed at [facility], [place] [address], [place], [place] [address]   Culture, blood (routine x 2)     Status: None   Collection Time: 05/03/20  8:30 PM   Specimen: BLOOD  Result Value Ref Range Status   Specimen Description BLOOD THUMB  Final   Special Requests   Final    AEROBIC BOTTLE ONLY Blood Culture results may not be optimal due to an inadequate volume of blood received in culture bottles   Culture   Final    NO GROWTH 5 DAYS Performed at [facility], [place] [address], [place], [place] [address]    Report Status 05/09/2020 FINAL  Final  Culture, blood (routine x 2)     Status: None   Collection Time: 05/03/20  8:50 PM   Specimen: BLOOD  LEFT HAND  Result Value Ref Range Status   Specimen Description BLOOD LEFT HAND  Final   Special Requests   Final    AEROBIC BOTTLE ONLY Blood Culture results may not be optimal due to an inadequate volume of blood received in culture bottles   Culture   Final    NO GROWTH 5 DAYS Performed at [facility], [place] [address], [place], [place] [address]    Report Status 05/09/2020 FINAL  Final      Radiology Studies: No results found.   Scheduled Meds: . allopurinol  100 mg Oral BID  . aspirin EC  81 mg Oral Daily  . atorvastatin  10  mg Oral Daily  . Chlorhexidine Gluconate Cloth  6 each Topical Q0600  . Chlorhexidine Gluconate Cloth  6 each Topical Q0600  . [START ON 05/10/2020] Chlorhexidine Gluconate Cloth  6 each Topical Q0600  . cinacalcet  30 mg Oral Q supper  . collagenase   Topical Daily  . [START ON 05/10/2020] darbepoetin (ARANESP) injection - DIALYSIS  100 mcg Intravenous Q Thu-HD  . docusate sodium  100 mg Oral BID  . feeding supplement (NEPRO CARB STEADY)   Oral BID  . isosorbide dinitrate  10 mg Oral TID  . melatonin  3 mg Oral QHS  . multivitamin  1 tablet Oral QHS  . pantoprazole  40 mg Oral Daily  . sevelamer carbonate  1.6 g Oral TID WC   Continuous Infusions: . ceFEPime (MAXIPIME) IV 1 g (05/09/20 1031)     LOS: 16 days   Time Spent in minutes   45 minutes  Lilias Lorensen D.O. on 05/09/2020 at 1:35 PM  Between 7am to 7pm - Please see pager noted on [email or web address]  After 7pm go to [email or web address]  And look for the night coverage person covering for me after hours  [facility] Office  [phone]

## 2020-05-09 NOTE — Progress Notes (Signed)
Occupational Therapy Treatment Patient Details Name: [name] MRN: [id number] DOB: June 24, 1945 Today's Date: 05/09/2020    History of present illness Patient is a 75 y/o female who presents from SNF due to infected groin wound now s/p I&D right groin wound 1/17, s/p washout of wound and Right sartorius muscle flap coverage and wound vac application 0/62/37. MRI negative for acute changes on 05/05/20. PMH includes Lft fem to below knee pop bypass with saphenous vein, toe amputations, breast ca, left shoulder dislocation, CKD, CAD, STEMI, OA.   OT comments  Pt with fair participation this date; significant fatigue limiting full participation. Seen with PT services present to maximize safety and functional outcomes. Currently requiring Min A+2 for safety with bed mobility and transition to and from EOB; soft BP throughout session (values listed below) with persistent reports of dizziness but VSS wfl. Currently requiring max-dep for toileting, max-dep for LB ADL's in standing/sitting limited by discomfort and tolerance. Would continue to benefit from acute as well as post acute OT at time of dc to maximize indep and safety with basic ADL's.    Follow Up Recommendations  SNF;Supervision/Assistance - 24 hour    Equipment Recommendations  3 in 1 bedside commode    Recommendations for Other Services      Precautions / Restrictions Precautions Precautions: Fall;Other (comment) Precaution Comments: keep right heel offloaded per pt, wound vac, soft BP Restrictions Weight Bearing Restrictions: No Other Position/Activity Restrictions: toe weight bearing on right per pt.       Mobility Bed Mobility Overal bed mobility: Needs Assistance Bed Mobility: Supine to Sit Rolling: Min guard   Supine to sit: Min assist;+2 for safety/equipment Sit to supine: Max assist;+2 for physical assistance   General bed mobility comments: pt with good effort for bringing legs towards EOB but requiring  significnat A for trunk to transition to sitting EOb.  Transfers Overall transfer level: Needs assistance Equipment used: Rolling walker (2 wheeled) Transfers: Sit to/from Stand Sit to Stand: Min assist;+2 safety/equipment;From elevated surface         General transfer comment: elevated surface    Balance Overall balance assessment: Needs assistance Sitting-balance support: Feet supported;Single extremity supported Sitting balance-Leahy Scale: Fair Sitting balance - Comments: pt able to perform lateral leans with elbow taps and needs supervision to min guard for safety static sitting due to air bed/drowsiness Postural control: Posterior lean Standing balance support: Bilateral upper extremity supported;During functional activity Standing balance-Leahy Scale: Poor Standing balance comment: Reliant on UE support in stance, x1 posterior LOB upon standing needing modA to correct with RW; dizzy throughout                           ADL either performed or assessed with clinical judgement   ADL                               Toileting- Clothing Manipulation and Hygiene: Sit to/from stand;Maximal assistance;Total assistance Toileting - Clothing Manipulation Details (indicate cue type and reason): incontinence of BM during session requiring dep for hygiene, fair tolerance for standing. discomfort with wiping; noted sacral bandage             Vision       Perception     Praxis      Cognition Arousal/Alertness: Lethargic Behavior During Therapy: Flat affect Overall Cognitive Status: Impaired/Different from baseline Area of Impairment: Attention;Memory;Following commands;Safety/judgement;Problem solving;Awareness;Orientation  Orientation Level: Disoriented to;Place;Time Current Attention Level: Focused Memory: Decreased short-term memory Following Commands: Follows one step commands with increased time;Follows one step commands  inconsistently Safety/Judgement: Decreased awareness of safety;Decreased awareness of deficits Awareness: Intellectual Problem Solving: Slow processing;Requires verbal cues;Decreased initiation;Difficulty sequencing;Requires tactile cues General Comments: pt with difficulty with paorticipation limited primarily by lethargy, difficulty with attending to task and verbalizations throughout requiring increased time and cuing.        Exercises Exercises: General Lower Extremity;Other exercises General Exercises - Lower Extremity Ankle Circles/Pumps: AROM;Both;10 reps;Supine Short Arc Quad: AROM;Both;10 reps;Supine Long Arc Quad: AROM;Both;10 reps;Seated Hip ABduction/ADduction: AROM;Both;10 reps;Supine (hip ADduction pillow squeezes) Hip Flexion/Marching: AROM;Both;10 reps;Standing Other Exercises Other Exercises: seated BUE AROM: shoulder punches (elbow extension) with BUE in 90 degrees of flexion x10 reps   Shoulder Instructions       General Comments BP soft, improves with UE exercises but again drops with static sitting/standing, RN notified and deferred gait trial for safety.    Pertinent Vitals/ Pain       Pain Assessment: Faces Faces Pain Scale: Hurts little more Pain Location: pt unable to localize Pain Descriptors / Indicators: Discomfort Pain Intervention(s): Limited activity within patient's tolerance;Monitored during session  Home Living                                          Prior Functioning/Environment              Frequency  Min 2X/week        Progress Toward Goals  OT Goals(current goals can now be found in the care plan section)     Acute Rehab OT Goals Patient Stated Goal: to move better OT Goal Formulation: With patient Time For Goal Achievement: 05/15/20 Potential to Achieve Goals: Good  Plan Discharge plan remains appropriate    Co-evaluation      Reason for Co-Treatment: Complexity of the patient's impairments  (multi-system involvement) PT goals addressed during session: Strengthening/ROM        AM-PAC OT "6 Clicks" Daily Activity     Outcome Measure   Help from another person eating meals?: A Little Help from another person taking care of personal grooming?: A Little Help from another person toileting, which includes using toliet, bedpan, or urinal?: A Lot Help from another person bathing (including washing, rinsing, drying)?: A Lot Help from another person to put on and taking off regular upper body clothing?: A Little Help from another person to put on and taking off regular lower body clothing?: A Lot 6 Click Score: 15    End of Session Equipment Utilized During Treatment: Rolling walker  OT Visit Diagnosis: Unsteadiness on feet (R26.81);Muscle weakness (generalized) (M62.81);Pain;Other symptoms and signs involving cognitive function   Activity Tolerance Patient tolerated treatment well;Patient limited by fatigue   Patient Left with call bell/phone within reach;with chair alarm set;in bed   Nurse Communication Mobility status    Time: 7915-0569 OT Time Calculation (min): 28 min  Charges: OT General Charges $OT Visit: 1 Visit OT Treatments $Self Care/Home Management : 8-22 mins  Kirill Chatterjee OTR/L acute rehab services Office: [place] 05/09/2020, 2:07 PM

## 2020-05-09 NOTE — Progress Notes (Addendum)
[facility] Progress Note   Subjective:   Alert, appears quite tired, slowly taking meds with RN assistance. Denies SOB and CP but continues to have LE pain. Reports dialysis is very uncomfortable due to her LE pain.   Objective Vitals:   05/09/20 0008 05/09/20 0345 05/09/20 0536 05/09/20 0807  BP: (!) 120/48 (!) 119/48  (!) 124/48  Pulse: 94 96    Resp: 17 17 16 14   Temp: 97.7 F (36.5 C) 97.6 F (36.4 C)  97.6 F (36.4 C)  TempSrc: Oral Oral  Oral  SpO2: 100% 100%  98%  Weight:   58 kg    Physical Exam General:Well developed, frail appearing female in NAD. Heart:RRR, no murmurs, rubs or gallops Lungs:CTA bilaterally without wheezing, rhonchi or rales Abdomen:Soft, non-distended, +BS Extremities:No edema b/l lower extremities Dialysis Access:RUE AVF + bruit  Additional Objective Labs: Basic Metabolic Panel: Recent Labs  Lab 05/03/20 1025 05/05/20 0909 05/07/20 1509  NA 137 134* 133*  K 4.9 4.7 4.1  CL 100 99 98  CO2 20* 20* 19*  GLUCOSE 84 95 140*  BUN 64* 58* 64*  CREATININE 7.12* 6.80* 7.24*  CALCIUM 7.9* 7.8* 8.4*  PHOS 6.2* 6.3* 5.3*   Liver Function Tests: Recent Labs  Lab 05/03/20 1025 05/05/20 0909 05/07/20 1509  ALBUMIN 2.1* 2.0* 2.1*   No results for input(s): LIPASE, AMYLASE in the last 168 hours. CBC: Recent Labs  Lab 05/05/20 0208 05/06/20 0305 05/07/20 0142 05/08/20 0038 05/09/20 0131  WBC 9.0 8.8 9.0 8.7 11.9*  HGB 9.8* 9.8* 8.4* 7.6* 7.9*  HCT 30.1* 29.9* 26.0* 22.4* 24.6*  MCV 98.4 99.0 98.9 97.4 101.2*  PLT 181 187 185 158 171   Blood Culture    Component Value Date/Time   SDES BLOOD LEFT HAND 05/03/2020 2050   SPECREQUEST  05/03/2020 2050    AEROBIC BOTTLE ONLY Blood Culture results may not be optimal due to an inadequate volume of blood received in culture bottles   CULT  05/03/2020 2050    NO GROWTH 5 DAYS Performed at [facility] [address] [place], [place] [address]    REPTSTATUS  05/09/2020 FINAL 05/03/2020 2050    Cardiac Enzymes: No results for input(s): CKTOTAL, CKMB, CKMBINDEX, TROPONINI in the last 168 hours. CBG: Recent Labs  Lab 05/08/20 0614 05/08/20 1131 05/08/20 1640 05/08/20 2103 05/09/20 0607  GLUCAP 78 83 101* 106* 105*   Iron Studies: No results for input(s): IRON, TIBC, TRANSFERRIN, FERRITIN in the last 72 hours. @lablastinr3 @ Studies/Results: No results found. Medications: . ceFEPime (MAXIPIME) IV 1 g (05/09/20 1031)   . allopurinol  100 mg Oral BID  . aspirin EC  81 mg Oral Daily  . atorvastatin  10 mg Oral Daily  . Chlorhexidine Gluconate Cloth  6 each Topical Q0600  . Chlorhexidine Gluconate Cloth  6 each Topical Q0600  . cinacalcet  30 mg Oral Q supper  . collagenase   Topical Daily  . [START ON 05/10/2020] darbepoetin (ARANESP) injection - DIALYSIS  100 mcg Intravenous Q Thu-HD  . docusate sodium  100 mg Oral BID  . feeding supplement (NEPRO CARB STEADY)   Oral BID  . hydrALAZINE  50 mg Oral TID  . isosorbide dinitrate  10 mg Oral TID  . melatonin  3 mg Oral QHS  . multivitamin  1 tablet Oral QHS  . pantoprazole  40 mg Oral Daily  . sevelamer carbonate  1.6 g Oral TID WC    Dialysis Orders: NWon  TTS LTJ03.0 kgHD Bath 2K 2Ca 3 hours 45 minutes,no heparin. AccessRUA AVF Mircera 30 MCG every 2 weeks last on 01/13 units IV/HD Venofer100 mg loading needs 9 more doses  Assessment/Plan: 1. 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 S/pinitial debridement1/17, thenwashout 1/18 by Dr. Donzetta Matters.Per [facility]. 2. AMS: W/U per primary/Neuro. No acute abnormalities on MRI. Avoid sedating medications. Slightly improved today. Has not missed HD, not uremic. 3. ESRD: Usually TTS schedule, had HD off schedule on Monday. No acute indications for dialysis today, will plan for next HD Thursday to get back on schedule. 4. Hypertension/volume-BP controlled. She is currently imdur. Also on hydralazine with  most doses being held for lower blood pressures. Will discontinue hydralazine at this time. Does not appear grossly volume overloaded.UF as tolerated.Large pleural effusion on imaging, will try to keep volume down with HD. 5. Anemiaof CKD: Hgbtrended down, now stable in the 7's, no bleeding reported,continue Aranespq Thursday(lastdose 60 mcg IV1/27) Dose increased for Thursday. HoldingIViron load with infection. 6. Metabolic bone disease -Corrected calcium and phos within goal. Continue Renvela binder, Sensipar 30 daily 7. Nutrition - renal diet with fluid restrictions, vitamins, Nepro 8. History of CAD 9. History of systolic/diastolic heart failure 10. Epistaxis: Likely cause of Hgb decline though no further bleeding reported today. INR 1.3, plts ok. Per primary. No heparin with HD. 11. Dispo: Pt remains very frail and debilitated. She expressed that dialysis has become difficult. I informed her that she has the right to stop dialysis if she decides it is not worth the discomfort anymore, but this will lead to death within a few weeks. Appears Dr. Donnetta Hutching planning on addressing [place] with her family as well. For now, continue current plan.    [name], PA-C 05/09/2020, 11:10 AM  [facility] Pager: [phone]  I have seen and examined this patient and agree with plan and assessment in the above note with renal recommendations/intervention highlighted.  Broadus John A Cameshia Cressman,MD 05/09/2020 2:45 PM

## 2020-05-09 NOTE — Care Management Important Message (Signed)
Important Message  Patient Details  Name: [name] MRN: [id number] Date of Birth: 09/01/1945   Medicare Important Message Given:  Yes     [name] 05/09/2020, 1:01 PM

## 2020-05-09 NOTE — Progress Notes (Signed)
Physical Therapy Treatment Patient Details Name: [name] MRN: [id number] DOB: Jan 28, 1946 Today's Date: 05/09/2020    History of Present Illness Patient is a 75 y/o female who presents from SNF due to infected groin wound now s/p I&D right groin wound 1/17, s/p washout of wound and Right sartorius muscle flap coverage and wound vac application 4/00/86. MRI negative for acute changes on 05/05/20. PMH includes Lft fem to below knee pop bypass with saphenous vein, toe amputations, breast ca, left shoulder dislocation, CKD, CAD, STEMI, OA.    PT Comments    Pt received in supine, drowsy but agreeable to therapy session, with A&O x2, with good participation and fair tolerance for mobility. Pt requiring greatly increased time to initiate and perform all mobility tasks due to slow processing/lethargy and frequently keeping eyes closed, but will open eyes to command. Pt limited due to reported dizziness and noted to be hypotensive seated EOB, RN notified. Pt performed supine/seated BLE therapeutic exercises with good tolerance and pre-gait hip flexion at bedside, deferred gait trial due to pt continued symptoms of dizziness and lethargy in stance. Pt mostly minA for bed mobility and transfers but needed +2 assist for safety this session due to decreased activity tolerance, multiple lines/leads and decreased cognition. Pt continues to benefit from PT services to progress toward functional mobility goals. Continue to recommend SNF. Orthostatic BPs Supine 112/66 (79); HR 93  Sitting 106/51 (63)  Sitting after 3 min Post-arm exercise 112/57 (74)  Standing 101/47 (64)  Supine after mobility 112/50 (68)    Follow Up Recommendations  SNF;Supervision for mobility/OOB     Equipment Recommendations  Other (comment) (defer to next location)    Recommendations for Other Services       Precautions / Restrictions Precautions Precautions: Fall;Other (comment) Precaution Comments: keep right heel  offloaded per pt, wound vac, soft BP Restrictions Weight Bearing Restrictions: No Other Position/Activity Restrictions: toe weight bearing on right per pt.    Mobility  Bed Mobility Overal bed mobility: Needs Assistance Bed Mobility: Supine to Sit Rolling: Min guard   Supine to sit: Min assist;+2 for safety/equipment Sit to supine: Max assist;+2 for physical assistance   General bed mobility comments: Pt able to bring BLEs towards EOB. MinA to elevate trunk. Upon return to supine, increased assist BLE/trunk as pt air bed too high for her to lift hips up to, pt able to assist with trunk return to supine; pt able to scoot anterior to EOB with increased time/min guard  Transfers Overall transfer level: Needs assistance Equipment used: Rolling walker (2 wheeled) Transfers: Sit to/from Stand Sit to Stand: Min assist;+2 safety/equipment;From elevated surface         General transfer comment: from elevated EOB to RW with +68minA, upon standing mild LOB posterior needing modA of one to maintain balance at [facility]  Ambulation/Gait             General Gait Details: pre-gait standing hip flexion x10 reps, deferred longer trial due to hypotension in stance and pt symptomatic/dizzy/drowsy   Stairs             Wheelchair Mobility    Modified Rankin (Stroke Patients Only)       Balance Overall balance assessment: Needs assistance Sitting-balance support: No upper extremity supported;Feet supported Sitting balance-Leahy Scale: Fair Sitting balance - Comments: pt able to perform lateral leans with elbow taps and needs supervision to min guard for safety static sitting due to air bed/drowsiness Postural control: Posterior lean Standing balance  support: Bilateral upper extremity supported;During functional activity Standing balance-Leahy Scale: Poor Standing balance comment: Reliant on UE support in stance, x1 posterior LOB upon standing needing modA to correct with RW; dizzy  throughout                            Cognition Arousal/Alertness: Lethargic Behavior During Therapy: [facility] for tasks assessed/performed;Flat affect (Less flat than indicated in prior sessions) Overall Cognitive Status: Impaired/Different from baseline Area of Impairment: Attention;Memory;Following commands;Safety/judgement;Problem solving;Awareness;Orientation                 Orientation Level: Disoriented to;Place;Time Current Attention Level: Focused Memory: Decreased short-term memory Following Commands: Follows one step commands with increased time;Follows one step commands inconsistently (Requiring signficant time) Safety/Judgement: Decreased awareness of safety;Decreased awareness of deficits Awareness: Intellectual Problem Solving: Slow processing;Requires verbal cues;Decreased initiation;Difficulty sequencing;Requires tactile cues General Comments: Pt requiring increased time to respond to questions/conversation and commands. Pt with moments of decreased attention and keeping eyes closed frequently during session; very drowsy. Pt with poor awareness of safety and deficits. Generally flat affect but smiling briefly at therapist and during conversation. Not oriented to location (able to state [place] but needs x2 hints- place where MD/RN is and starts with H to state "hospital") or to time.      Exercises General Exercises - Lower Extremity Ankle Circles/Pumps: AROM;Both;10 reps;Supine Short Arc Quad: AROM;Both;10 reps;Supine Long Arc Quad: AROM;Both;10 reps;Seated Hip ABduction/ADduction: AROM;Both;10 reps;Supine (hip ADduction pillow squeezes) Hip Flexion/Marching: AROM;Both;10 reps;Standing Other Exercises Other Exercises: seated BUE AROM: shoulder punches (elbow extension) with BUE in 90 degrees of flexion x10 reps    General Comments General comments (skin integrity, edema, etc.): BP soft, improves with UE exercises but again drops with static  sitting/standing, RN notified and deferred gait trial for safety.      Pertinent Vitals/Pain Pain Assessment: Faces Faces Pain Scale: Hurts little more Pain Location: pt unable to localize Pain Descriptors / Indicators: Discomfort Pain Intervention(s): Monitored during session;Repositioned (heels offloaded pre/post session and pt sidelying to offload sacrum end of session)    Home Living                      Prior Function            PT Goals (current goals can now be found in the care plan section) Acute Rehab PT Goals Patient Stated Goal: be able to take care of myself PT Goal Formulation: With patient Time For Goal Achievement: 05/14/20 Potential to Achieve Goals: Fair Progress towards PT goals: Progressing toward goals    Frequency    Min 3X/week      PT Plan Current plan remains appropriate    Co-evaluation PT/OT/SLP Co-Evaluation/Treatment: Yes Reason for Co-Treatment: Complexity of the patient's impairments (multi-system involvement);Necessary to address cognition/behavior during functional activity;To address functional/ADL transfers PT goals addressed during session: Mobility/safety with mobility;Balance;Proper use of DME;Strengthening/ROM        AM-PAC PT "6 Clicks" Mobility   Outcome Measure  Help needed turning from your back to your side while in a flat bed without using bedrails?: A Little Help needed moving from lying on your back to sitting on the side of a flat bed without using bedrails?: A Little Help needed moving to and from a bed to a chair (including a wheelchair)?: A Little Help needed standing up from a chair using your arms (e.g., wheelchair or bedside chair)?: A Lot Help needed to  walk in hospital room?: A Lot Help needed climbing 3-5 steps with a railing? : Total 6 Click Score: 14    End of Session Equipment Utilized During Treatment: Gait belt Activity Tolerance: Patient tolerated treatment well;Patient limited by  fatigue;Other (comment) (hypotensive) Patient left: in bed;with call bell/phone within reach;Other (comment) (rails up per air bed safety and semi-sidelying to R to offload) Nurse Communication: Mobility status (needs band-aid for L forearm old scab has some new blood) PT Visit Diagnosis: Unsteadiness on feet (R26.81);Muscle weakness (generalized) (M62.81);Difficulty in walking, not elsewhere classified (R26.2);Other abnormalities of gait and mobility (R26.89)     Time: 7703-4035 PT Time Calculation (min) (ACUTE ONLY): 42 min  Charges:  $Therapeutic Exercise: 8-22 mins $Therapeutic Activity: 8-22 mins                     Ambyr Qadri P., PTA Acute Rehabilitation Services Pager: [phone] Office: [place] 05/09/2020, 12:44 PM

## 2020-05-09 NOTE — Consult Note (Signed)
[facility] Nurse wound follow up Wound type: Surgical. Vascular team following.  PA-C removed dressing this am and took photo.  Measurement: Per Wednesday by this writer, 8.8cm x 4cm x 3cm Wound bed:85% red, 10% yellow, 5% black eschar to wound edge. Drainage (amount, consistency, odor) serosanguinous. Cannister to be changed today. By Bedside RN. It is ordered via the Unit Secretary. Periwound: As noted in the Vascular PA-C's note: "there is a firmness inferior to wound without expansion". Dressing procedure/placement/frequency: Continue NPWT. Wound cleansed with NS, wound and surrounding skin are patted dry. One (1) piece of black foam used to obliterate dead space, secured with drape and attached to 134mmHg continuous negative pressure. An immediate seal is achieved.  Next dressing change is scheduled for Friday, 05/11/20.  Supplies in room for that change.  [place] nursing team will follow, and will remain available to this patient, the nursing and medical teams.   Thanks, Maudie Flakes, MSN, RN, [place], Arther Abbott  Pager# [phone]

## 2020-05-10 DIAGNOSIS — J90 Pleural effusion, not elsewhere classified: Secondary | ICD-10-CM | POA: Diagnosis not present

## 2020-05-10 DIAGNOSIS — R404 Transient alteration of awareness: Secondary | ICD-10-CM | POA: Diagnosis not present

## 2020-05-10 DIAGNOSIS — G9341 Metabolic encephalopathy: Secondary | ICD-10-CM | POA: Diagnosis not present

## 2020-05-10 DIAGNOSIS — Z9889 Other specified postprocedural states: Secondary | ICD-10-CM | POA: Diagnosis not present

## 2020-05-10 LAB — RENAL FUNCTION PANEL
Albumin: 2.1 g/dL — ABNORMAL LOW (ref 3.5–5.0)
Anion gap: 17 — ABNORMAL HIGH (ref 5–15)
BUN: 77 mg/dL — ABNORMAL HIGH (ref 8–23)
CO2: 19 mmol/L — ABNORMAL LOW (ref 22–32)
Calcium: 8.7 mg/dL — ABNORMAL LOW (ref 8.9–10.3)
Chloride: 100 mmol/L (ref 98–111)
Creatinine, Ser: 8.6 mg/dL — ABNORMAL HIGH (ref 0.44–1.00)
GFR, Estimated: 4 mL/min — ABNORMAL LOW (ref >60)
Glucose, Bld: 120 mg/dL — ABNORMAL HIGH (ref 70–99)
Phosphorus: 6 mg/dL — ABNORMAL HIGH (ref 2.5–4.6)
Potassium: 4.6 mmol/L (ref 3.5–5.1)
Sodium: 136 mmol/L (ref 135–145)

## 2020-05-10 LAB — CBC
HCT: 24.3 % — ABNORMAL LOW (ref 36.0–46.0)
HCT: 24.2 % — ABNORMAL LOW (ref 36.0–46.0)
Hemoglobin: 7.7 g/dL — ABNORMAL LOW (ref 12.0–15.0)
Hemoglobin: 8.1 g/dL — ABNORMAL LOW (ref 12.0–15.0)
MCH: 32 pg (ref 26.0–34.0)
MCH: 33.3 pg (ref 26.0–34.0)
MCHC: 31.7 g/dL (ref 30.0–36.0)
MCHC: 33.5 g/dL (ref 30.0–36.0)
MCV: 100.8 fL — ABNORMAL HIGH (ref 80.0–100.0)
MCV: 99.6 fL (ref 80.0–100.0)
Platelets: 150 10*3/uL (ref 150–400)
Platelets: 145 10*3/uL — ABNORMAL LOW (ref 150–400)
RBC: 2.41 MIL/uL — ABNORMAL LOW (ref 3.87–5.11)
RBC: 2.43 MIL/uL — ABNORMAL LOW (ref 3.87–5.11)
RDW: 21.5 % — ABNORMAL HIGH (ref 11.5–15.5)
RDW: 21.9 % — ABNORMAL HIGH (ref 11.5–15.5)
WBC: 9.6 10*3/uL (ref 4.0–10.5)
WBC: 8.1 10*3/uL (ref 4.0–10.5)
nRBC: 2.4 % — ABNORMAL HIGH (ref 0.0–0.2)
nRBC: 3.7 % — ABNORMAL HIGH (ref 0.0–0.2)

## 2020-05-10 LAB — GLUCOSE, CAPILLARY
Glucose-Capillary: 96 mg/dL (ref 70–99)
Glucose-Capillary: 101 mg/dL — ABNORMAL HIGH (ref 70–99)
Glucose-Capillary: 78 mg/dL (ref 70–99)

## 2020-05-10 MED ORDER — DARBEPOETIN ALFA 100 MCG/0.5ML IJ SOSY
PREFILLED_SYRINGE | INTRAMUSCULAR | Status: AC
  Filled 2020-05-10: qty 0.5

## 2020-05-10 MED ORDER — OXYCODONE-ACETAMINOPHEN 5-325 MG PO TABS: 1.0000 | ORAL_TABLET | ORAL | Status: DC | PRN

## 2020-05-10 NOTE — Progress Notes (Addendum)
[facility] Progress Note   Subjective:   Pt seen in room, sister at bedside. Patient appears lethargic this AM but her sister reports she was able to eat some breakfast and sat up for most of the day yesterday. They are planning to meet with palliative care today. Pt denies SOB, CP, abdominal pain and nausea.   Objective Vitals:   05/09/20 2326 05/10/20 0528 05/10/20 0800 05/10/20 0813  BP: (!) 110/45 (!) 129/48 (!) 120/51   Pulse: 89 90 (!) 40 (!) 50  Resp: 13 16 20 20   Temp: 97.6 F (36.4 C) 97.6 F (36.4 C)  (!) 97.4 F (36.3 C)  TempSrc: Oral Oral    SpO2: 100% 100% 100%   Weight:       Physical Exam General:Well developed, frail appearing female in NAD. Heart:RRR, no murmurs, rubs or gallops Lungs:CTA bilaterally without wheezing, rhonchi or rales Abdomen:Soft, non-distended, +BS Extremities:No edema b/l lower extremities Dialysis Access:RUE AVF + bruit  Additional Objective Labs: Basic Metabolic Panel: Recent Labs  Lab 05/07/20 1509 05/09/20 1056 05/10/20 0125  NA 133* 135 136  K 4.1 4.7 4.6  CL 98 99 100  CO2 19* 19* 19*  GLUCOSE 140* 96 120*  BUN 64* 68* 77*  CREATININE 7.24* 8.08* 8.60*  CALCIUM 8.4* 8.7* 8.7*  PHOS 5.3* 5.4* 6.0*   Liver Function Tests: Recent Labs  Lab 05/07/20 1509 05/09/20 1056 05/10/20 0125  ALBUMIN 2.1* 2.1* 2.1*   CBC: Recent Labs  Lab 05/06/20 0305 05/07/20 0142 05/08/20 0038 05/09/20 0131 05/10/20 0125  WBC 8.8 9.0 8.7 11.9* 9.6  HGB 9.8* 8.4* 7.6* 7.9* 7.7*  HCT 29.9* 26.0* 22.4* 24.6* 24.3*  MCV 99.0 98.9 97.4 101.2* 100.8*  PLT 187 185 158 171 150   Blood Culture    Component Value Date/Time   SDES BLOOD LEFT HAND 05/03/2020 2050   SPECREQUEST  05/03/2020 2050    AEROBIC BOTTLE ONLY Blood Culture results may not be optimal due to an inadequate volume of blood received in culture bottles   CULT  05/03/2020 2050    NO GROWTH 5 DAYS Performed at [facility] [address], [place], [place] [address]    REPTSTATUS 05/09/2020 FINAL 05/03/2020 2050   CBG: Recent Labs  Lab 05/09/20 0607 05/09/20 1131 05/09/20 1140 05/09/20 1555 05/10/20 0942  GLUCAP 105* 98 94 92 96    Medications: . ceFEPime (MAXIPIME) IV 1 g (05/10/20 0824)   . allopurinol  100 mg Oral BID  . aspirin EC  81 mg Oral Daily  . atorvastatin  10 mg Oral Daily  . Chlorhexidine Gluconate Cloth  6 each Topical Q0600  . Chlorhexidine Gluconate Cloth  6 each Topical Q0600  . Chlorhexidine Gluconate Cloth  6 each Topical Q0600  . cinacalcet  30 mg Oral Q supper  . collagenase   Topical Daily  . darbepoetin (ARANESP) injection - DIALYSIS  100 mcg Intravenous Q Thu-HD  . docusate sodium  100 mg Oral BID  . feeding supplement (NEPRO CARB STEADY)   Oral BID  . isosorbide dinitrate  10 mg Oral TID  . melatonin  3 mg Oral QHS  . multivitamin  1 tablet Oral QHS  . pantoprazole  40 mg Oral Daily  . sevelamer carbonate  1.6 g Oral TID WC    Dialysis Orders: [facility] TTS NLG92.1 kgHD Bath 2K 2Ca 3 hours 45 minutes,no heparin. AccessRUA AVF Mircera 30 MCG every 2 weeks last on 01/13 units IV/HD Venofer100 mg  loading needs 9 more doses   Assessment/Plan: 1. 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 S/pinitial debridement1/17, thenwashout 1/18 by Dr. Donzetta Matters.Per [facility]. 2. AMS: W/U per primary/Neuro. No acute abnormalities on MRI. Avoid sedating medications. Has not missed HD, not uremic.Has been more alert in the afternoons per notes. 3. ESRD:Usually TTS schedule, had HD off schedule on Monday. Planned for HD today per regular schedule.  4. Hypertension/volume-BP controlled. She is currently imdur.Hydralazine stopped yesterday. Does not appear grossly volume overloaded.UF as tolerated.Large pleural effusion on imaging, will try to keep volume down with HD. 5. Anemiaof CKD: Hgbtrended down, now stable in the 7's, no bleeding reported,continue  Aranespq Thursday-Dose increased today. HoldingIViron load with infection. 6. Metabolic bone disease -Corrected calciumwithin goal, phos slightly elevated today. Continue Renvela binder, Sensipar 30 daily 7. Nutrition - renal diet with fluid restrictions, vitamins, Nepro 8. History of CAD 9. History of systolic/diastolic heart failure 10. Epistaxis: Likely cause ofHgbdeclinethough no further bleeding reported. INR 1.3, plts ok. Per primary. No heparin with HD. 11. Dispo: Pt remains very frail and debilitated. She expressed that dialysis has become difficult. I informed her that she has the right to stop dialysis if she decides it is not worth the discomfort anymore, but this will lead to death within a few weeks. Palliative care has been consulted for [place] discussion with her family as well. For now, continue current plan.    [name], PA-C 05/10/2020, 10:51 AM  [facility] Pager: [phone]  I have seen and examined this patient and agree with plan and assessment in the above note with renal recommendations/intervention highlighted.  Agree with Palliative care meeting with family given her steady decline in functional and cognitive status. Governor Rooks Inella Kuwahara,MD 05/10/2020 12:45 PM

## 2020-05-10 NOTE — Progress Notes (Signed)
Patient states that there is a knot in her throat, when RN asks further detailed questions patient then advised that there is a knot in her nose  Patient given pudding with medication, patient then stated that she would not take anything else because the knot keeps getting bigger, patient does not cough when given food or drink  Patient states that she just does not feel good   RN will continue to monitor patient

## 2020-05-10 NOTE — Progress Notes (Signed)
PROGRESS NOTE    [name]  PIR:[id number] DOB: 04-12-1945 DOA: 04/16/2020 PCP: Unk Pinto, MD   Brief Narrative:  75 year old with history of ESRD on hemodialysis, status post right external iliac and common femoral enterectomy with right femoral to anterior tibial artery bypass, insulin-dependent diabetes mellitus, type II, essential hypertension, moderate pulmonary pretension, presented with infected right groin surgical wound on 04/23/2020 status post I&D and wound VAC placement.  She was started on antibiotics by vascular surgery.  TRH consulted for acute metabolic encephalopathy. Assessment & Plan   Acute metabolic encephalopathy versus acute delirium -Possibly due to polypharmacy -MRI obtained and was unremarkable for CVA.  It did show chronic ischemic changes -Speech consulted, evaluation was unremarkable -PT recommending SNF -Improved, currently alert and oriented x3  Right-sided pleural effusion CT chest showed large right pleural effusion, small to moderate left pleural effusion with compressive atelectasis.  Subtotal collapse of the right lower lobe.  No superimposed focal pulmonary infiltrate. -Currently asymptomatic  Right groin surgical wound infection -Per primary, vascular surgery  Diabetes mellitus, type II -Hemoglobin A1c 4.9 -Patient likely should go home on no oral hypoglycemic agents  History of pulmonary hypertension -Echocardiogram showed an EF 50 to 55%.  LV demonstrates regional wall motion abnormalities.  Grade 2 diastolic dysfunction.  RV systolic pressure 63.01 mmHg -Continue to monitor   Epistaxis -Hemoglobin 7.9, continue to monitor CBC -INR on 05/06/20 was 1.3  ESRD -Patient dialyzes Tuesday, Thursday, Saturday -Nephrology consulted and appreciated   Goals of care -Palliative care consulted per vascular surgery  Stage I pressure injury  Pressure Injury 04/28/2020 Buttocks Right;Left;Mid Stage 1 -  Intact skin with non-blanchable  redness of a localized area usually over a bony prominence. redden and tender to touch (Active)  04/20/2020 1930  Location: Buttocks  Location Orientation: Right;Left;Mid  Staging: Stage 1 -  Intact skin with non-blanchable redness of a localized area usually over a bony prominence.  Wound Description (Comments): redden and tender to touch  Present on Admission:     DVT Prophylaxis  per primary  Code Status: Full  Family Communication: None at bedside  Disposition Plan:  Status is: Inpatient  Remains inpatient appropriate because:Inpatient level of care appropriate due to severity of illness   Dispo: Per primary   Consultants [facility] Nephrology  Procedures  Echocardiogram  Antibiotics   Anti-infectives (From admission, onward)   Start     Dose/Rate Route Frequency Ordered Stop   04/25/20 1430  ceFEPIme (MAXIPIME) 1 g in sodium chloride 0.9 % 100 mL IVPB        1 g 200 mL/hr over 30 Minutes Intravenous Every 24 hours 04/25/20 1334     04/08/2020 1200  vancomycin (VANCOCIN) IVPB 750 mg/150 ml premix  Status:  Discontinued        750 mg 150 mL/hr over 60 Minutes Intravenous Every T-Th-Sa (Hemodialysis) 04/23/20 0145 04/25/20 1334   05/01/2020 1100  vancomycin (VANCOREADY) IVPB 1250 mg/250 mL        1,250 mg 166.7 mL/hr over 90 Minutes Intravenous  Once 04/27/2020 1003 04/07/2020 1243   04/23/20 0800  piperacillin-tazobactam (ZOSYN) IVPB 3.375 g  Status:  Discontinued        3.375 g 12.5 mL/hr over 240 Minutes Intravenous Every 8 hours 04/23/20 0753 04/23/20 0759   04/23/20 0800  piperacillin-tazobactam (ZOSYN) IVPB 2.25 g  Status:  Discontinued        2.25 g 100 mL/hr over 30 Minutes Intravenous Every 8 hours 04/23/20 0800 04/25/20 1334  04/23/20 0200  vancomycin (VANCOREADY) IVPB 1250 mg/250 mL  Status:  Discontinued        1,250 mg 166.7 mL/hr over 90 Minutes Intravenous  Once 04/23/20 0145 05/01/2020 1003      Subjective:   Tracy Gerken seen and examined today.  Patient  states she is not feeling well but could not elaborate.  Denies chest pain or shortness of breath, nausea or vomiting, dizziness or headache.   Objective:   Vitals:   05/10/20 1330 05/10/20 1353 05/10/20 1404 05/10/20 1506  BP: (!) 101/55 (!) 93/57 (!) 121/46 (!) 110/46  Pulse: 62 70 78 88  Resp:    13  Temp:   97.7 F (36.5 C) (!) 97.4 F (36.3 C)  TempSrc:   Oral Oral  SpO2:    98%  Weight:        Intake/Output Summary (Last 24 hours) at 05/10/2020 1530 Last data filed at 05/10/2020 1353 Gross per 24 hour  Intake --  Output 525 ml  Net -525 ml   Filed Weights   05/07/20 1540 05/07/20 1755 05/09/20 0536  Weight: 59.8 kg 58.8 kg 58 kg   Exam  General: Well developed, frail, chronically ill-appearing, NAD  HEENT: NCAT, mucous membranes moist.   Cardiovascular: S1 S2 auscultated, RRR  Respiratory: Clear to auscultation bilaterally with equal chest rise  Abdomen: Soft, nontender, nondistended, + bowel sounds  Extremities: warm dry without cyanosis clubbing or edema  Neuro: AAOx3, nonfocal  Psych: flat   Data Reviewed: I have personally reviewed following labs and imaging studies  CBC: Recent Labs  Lab 05/06/20 0305 05/07/20 0142 05/08/20 0038 05/09/20 0131 05/10/20 0125  WBC 8.8 9.0 8.7 11.9* 9.6  HGB 9.8* 8.4* 7.6* 7.9* 7.7*  HCT 29.9* 26.0* 22.4* 24.6* 24.3*  MCV 99.0 98.9 97.4 101.2* 100.8*  PLT 187 185 158 171 982   Basic Metabolic Panel: Recent Labs  Lab 05/05/20 0909 05/07/20 1509 05/09/20 1056 05/10/20 0125  NA 134* 133* 135 136  K 4.7 4.1 4.7 4.6  CL 99 98 99 100  CO2 20* 19* 19* 19*  GLUCOSE 95 140* 96 120*  BUN 58* 64* 68* 77*  CREATININE 6.80* 7.24* 8.08* 8.60*  CALCIUM 7.8* 8.4* 8.7* 8.7*  PHOS 6.3* 5.3* 5.4* 6.0*   GFR: Estimated Creatinine Clearance: 5 mL/min (A) (by C-G formula based on SCr of 8.6 mg/dL (H)). Liver Function Tests: Recent Labs  Lab 05/05/20 0909 05/07/20 1509 05/09/20 1056 05/10/20 0125  ALBUMIN 2.0* 2.1*  2.1* 2.1*   No results for input(s): LIPASE, AMYLASE in the last 168 hours. No results for input(s): AMMONIA in the last 168 hours. Coagulation Profile: Recent Labs  Lab 05/06/20 0625  INR 1.3*   Cardiac Enzymes: No results for input(s): CKTOTAL, CKMB, CKMBINDEX, TROPONINI in the last 168 hours. BNP (last 3 results) No results for input(s): PROBNP in the last 8760 hours. HbA1C: No results for input(s): HGBA1C in the last 72 hours. CBG: Recent Labs  Lab 05/09/20 0607 05/09/20 1131 05/09/20 1140 05/09/20 1555 05/10/20 0942  GLUCAP 105* 98 94 92 96   Lipid Profile: No results for input(s): CHOL, HDL, LDLCALC, TRIG, CHOLHDL, LDLDIRECT in the last 72 hours. Thyroid Function Tests: No results for input(s): TSH, T4TOTAL, FREET4, T3FREE, THYROIDAB in the last 72 hours. Anemia Panel: No results for input(s): VITAMINB12, FOLATE, FERRITIN, TIBC, IRON, RETICCTPCT in the last 72 hours. Urine analysis:    Component Value Date/Time   COLORURINE DARK YELLOW 02/13/2020 1518   APPEARANCEUR TURBID (  A) 02/13/2020 1518   LABSPEC 1.015 02/13/2020 1518   PHURINE 6.5 02/13/2020 1518   GLUCOSEU NEGATIVE 02/13/2020 1518   HGBUR 2+ (A) 02/13/2020 1518   BILIRUBINUR NEGATIVE 08/25/2018 0045   KETONESUR NEGATIVE 02/13/2020 1518   PROTEINUR 3+ (A) 02/13/2020 1518   UROBILINOGEN 0.2 05/30/2009 1500   NITRITE NEGATIVE 02/13/2020 1518   LEUKOCYTESUR 3+ (A) 02/13/2020 1518   Sepsis Labs: @LABRCNTIP (procalcitonin:4,lacticidven:4)  ) Recent Results (from the past 240 hour(s))  SARS Coronavirus 2 by RT PCR (hospital order, performed in [place] hospital lab) Nasopharyngeal Nasopharyngeal Swab     Status: None   Collection Time: 05/03/20  6:01 PM   Specimen: Nasopharyngeal Swab  Result Value Ref Range Status   SARS Coronavirus 2 NEGATIVE NEGATIVE Final    Comment: (NOTE) SARS-CoV-2 target nucleic acids are NOT DETECTED.  The SARS-CoV-2 RNA is generally detectable in upper and  lower respiratory specimens during the acute phase of infection. The lowest concentration of SARS-CoV-2 viral copies this assay can detect is 250 copies / mL. A negative result does not preclude SARS-CoV-2 infection and should not be used as the sole basis for treatment or other patient management decisions.  A negative result may occur with improper specimen collection / handling, submission of specimen other than nasopharyngeal swab, presence of viral mutation(s) within the areas targeted by this assay, and inadequate number of viral copies (<250 copies / mL). A negative result must be combined with clinical observations, patient history, and epidemiological information.  Fact Sheet for Patients:   StrictlyIdeas.no  Fact Sheet for Healthcare Providers: BankingDealers.co.za  This test is not yet approved or  cleared by the [place] FDA and has been authorized for detection and/or diagnosis of SARS-CoV-2 by FDA under an Emergency Use Authorization (EUA).  This EUA will remain in effect (meaning this test can be used) for the duration of the COVID-19 declaration under Section 564(b)(1) of the Act, 21 U.S.C. section 360bbb-3(b)(1), unless the authorization is terminated or revoked sooner.  Performed at [facility], [place] [address] [place], [place] [address]   Culture, blood (routine x 2)     Status: None   Collection Time: 05/03/20  8:30 PM   Specimen: BLOOD  Result Value Ref Range Status   Specimen Description BLOOD THUMB  Final   Special Requests   Final    AEROBIC BOTTLE ONLY Blood Culture results may not be optimal due to an inadequate volume of blood received in culture bottles   Culture   Final    NO GROWTH 5 DAYS Performed at [facility], [place] [address], [place], [place] [address]    Report Status 05/09/2020 FINAL  Final  Culture, blood (routine x 2)     Status: None   Collection Time: 05/03/20  8:50 PM    Specimen: BLOOD LEFT HAND  Result Value Ref Range Status   Specimen Description BLOOD LEFT HAND  Final   Special Requests   Final    AEROBIC BOTTLE ONLY Blood Culture results may not be optimal due to an inadequate volume of blood received in culture bottles   Culture   Final    NO GROWTH 5 DAYS Performed at [facility], [place] [address], [place], [place] [address]    Report Status 05/09/2020 FINAL  Final      Radiology Studies: No results found.   Scheduled Meds: . allopurinol  100 mg Oral BID  . aspirin EC  81 mg Oral Daily  .  atorvastatin  10 mg Oral Daily  . Chlorhexidine Gluconate Cloth  6 each Topical Q0600  . Chlorhexidine Gluconate Cloth  6 each Topical Q0600  . Chlorhexidine Gluconate Cloth  6 each Topical Q0600  . cinacalcet  30 mg Oral Q supper  . collagenase   Topical Daily  . Darbepoetin Alfa      . darbepoetin (ARANESP) injection - DIALYSIS  100 mcg Intravenous Q Thu-HD  . docusate sodium  100 mg Oral BID  . feeding supplement (NEPRO CARB STEADY)   Oral BID  . isosorbide dinitrate  10 mg Oral TID  . melatonin  3 mg Oral QHS  . multivitamin  1 tablet Oral QHS  . pantoprazole  40 mg Oral Daily  . sevelamer carbonate  1.6 g Oral TID WC   Continuous Infusions: . ceFEPime (MAXIPIME) IV 1 g (05/10/20 0824)     LOS: 17 days   Time Spent in minutes   30 minutes  Cheyne Boulden D.O. on 05/10/2020 at 3:30 PM  Between 7am to 7pm - Please see pager noted on [email or web address]  After 7pm go to [email or web address]  And look for the night coverage person covering for me after hours  [facility] Office  [phone]

## 2020-05-10 NOTE — Progress Notes (Signed)
Patient ID: [name], female   DOB: [birth year], 75 y.o.   MRN: [id number] Comfortable. Alert this morning. Trying to eat oatmeal but doing poorly with her breakfast.  I discussed the fact that I have spoken with her sister and her husband yesterday regarding goals of care. Explained that we had asked hospice/palliative care to consult as well to discuss goals of care. She understands. Plan dressing change again tomorrow

## 2020-05-11 DIAGNOSIS — Z7189 Other specified counseling: Secondary | ICD-10-CM

## 2020-05-11 DIAGNOSIS — Z66 Do not resuscitate: Secondary | ICD-10-CM

## 2020-05-11 DIAGNOSIS — Z515 Encounter for palliative care: Secondary | ICD-10-CM | POA: Diagnosis not present

## 2020-05-11 DIAGNOSIS — L8990 Pressure ulcer of unspecified site, unspecified stage: Secondary | ICD-10-CM

## 2020-05-11 DIAGNOSIS — J90 Pleural effusion, not elsewhere classified: Secondary | ICD-10-CM | POA: Diagnosis not present

## 2020-05-11 DIAGNOSIS — Z9889 Other specified postprocedural states: Secondary | ICD-10-CM | POA: Diagnosis not present

## 2020-05-11 DIAGNOSIS — G9341 Metabolic encephalopathy: Secondary | ICD-10-CM | POA: Diagnosis not present

## 2020-05-11 LAB — CBC
HCT: 25.7 % — ABNORMAL LOW (ref 36.0–46.0)
Hemoglobin: 8.2 g/dL — ABNORMAL LOW (ref 12.0–15.0)
MCH: 32.2 pg (ref 26.0–34.0)
MCHC: 31.9 g/dL (ref 30.0–36.0)
MCV: 100.8 fL — ABNORMAL HIGH (ref 80.0–100.0)
Platelets: 143 10*3/uL — ABNORMAL LOW (ref 150–400)
RBC: 2.55 MIL/uL — ABNORMAL LOW (ref 3.87–5.11)
RDW: 22.1 % — ABNORMAL HIGH (ref 11.5–15.5)
WBC: 8.3 10*3/uL (ref 4.0–10.5)
nRBC: 3.1 % — ABNORMAL HIGH (ref 0.0–0.2)

## 2020-05-11 LAB — RENAL FUNCTION PANEL
Albumin: 2.3 g/dL — ABNORMAL LOW (ref 3.5–5.0)
Anion gap: 16 — ABNORMAL HIGH (ref 5–15)
BUN: 66 mg/dL — ABNORMAL HIGH (ref 8–23)
CO2: 19 mmol/L — ABNORMAL LOW (ref 22–32)
Calcium: 8.5 mg/dL — ABNORMAL LOW (ref 8.9–10.3)
Chloride: 101 mmol/L (ref 98–111)
Creatinine, Ser: 7.97 mg/dL — ABNORMAL HIGH (ref 0.44–1.00)
GFR, Estimated: 5 mL/min — ABNORMAL LOW (ref >60)
Glucose, Bld: 93 mg/dL (ref 70–99)
Phosphorus: 5.5 mg/dL — ABNORMAL HIGH (ref 2.5–4.6)
Potassium: 4.6 mmol/L (ref 3.5–5.1)
Sodium: 136 mmol/L (ref 135–145)

## 2020-05-11 LAB — GLUCOSE, CAPILLARY
Glucose-Capillary: 100 mg/dL — ABNORMAL HIGH (ref 70–99)
Glucose-Capillary: 110 mg/dL — ABNORMAL HIGH (ref 70–99)
Glucose-Capillary: 81 mg/dL (ref 70–99)
Glucose-Capillary: 86 mg/dL (ref 70–99)
Glucose-Capillary: 92 mg/dL (ref 70–99)

## 2020-05-11 LAB — PATHOLOGIST SMEAR REVIEW

## 2020-05-11 NOTE — Progress Notes (Signed)
Right foot wound changed with santyl as ordered. Will monitor. Khylei Wilms, Bettina Gavia RN

## 2020-05-11 NOTE — Progress Notes (Signed)
Patient with lots of dried blood in mouth from a nose bleed per RN shift report. Pt with intermittent oral suction and patient states she feels like something is caught in throat. Patient is abel to swallow. Mouth care and suctioning done prior to oral medications given.dried bloody secretions returned.  Patient does not have a nose bleed at this current time. Patient given medications whole in pudding. Medications given one at a time and patient able to swallow. No overt signs of choking with medications, did have some coughing with thin liquids with a straw.  Mouth suctioned after medication to ensure mouth clean. Will monitor patient. [name], Bettina Gavia rN

## 2020-05-11 NOTE — Consult Note (Addendum)
[facility] Nurse wound follow up Patient receiving care in [place]. Wound type: right groin surgical wound Measurement: deferred Wound bed: pale pink Drainage (amount, consistency, odor) serosanginous in cannister Periwound: intact Dressing procedure/placement/frequency: one piece of black foam placed into wound, drape applied, barrier ring placed next to groin area to enhance seal, immediate seal obtained.  Patient tolerated well. Supplies in room for Monday. Val Riles, RN, MSN, CWOCN, CNS-BC, pager [phone]

## 2020-05-11 NOTE — Progress Notes (Addendum)
Progress Note    05/11/2020 12:39 PM 17 Days Post-Op  Subjective: states her right thigh is hurting. Somewhat lethargic. moving her legs all around in the bed   Vitals:   05/11/20 0800 05/11/20 1102  BP: (!) 133/51 (!) 122/47  Pulse: 83 87  Resp: 18 14  Temp: 97.6 F (36.4 C) (!) 97.5 F (36.4 C)  SpO2: 99% 98%   Physical Exam: Cardiac:  regular Lungs:  Non labored Incisions: right lower extremity incisions healing well. Right groin wound with healthy appearing tissue. Pink granulation tissue. No bleeding or drainage. She has stable non expanding hematoma in the right upper thigh .  Extremities:  Palpable femoral pulses, Lower extremities well perfused and warm. Doppler right DP/ PT.  Abdomen: obese, soft, non tender Neurologic: alert, oriented but lethargic   CBC    Component Value Date/Time   WBC 8.3 05/11/2020 0024   RBC 2.55 (L) 05/11/2020 0024   HGB 8.2 (L) 05/11/2020 0024   HGB 11.7 05/18/2017 1218   HGB 12.3 02/02/2017 1136   HCT 25.7 (L) 05/11/2020 0024   HCT 34.7 05/18/2017 1218   HCT 37.4 02/02/2017 1136   PLT 143 (L) 05/11/2020 0024   PLT 227 05/18/2017 1218   MCV 100.8 (H) 05/11/2020 0024   MCV 87 05/18/2017 1218   MCV 90.6 02/02/2017 1136   MCH 32.2 05/11/2020 0024   MCHC 31.9 05/11/2020 0024   RDW 22.1 (H) 05/11/2020 0024   RDW 16.0 (H) 05/18/2017 1218   RDW 15.7 (H) 02/02/2017 1136   LYMPHSABS 1.1 04/21/2020 2141   LYMPHSABS 1.8 02/02/2017 1136   MONOABS 0.7 04/28/2020 2141   MONOABS 0.8 02/02/2017 1136   EOSABS 0.1 04/17/2020 2141   EOSABS 0.3 02/02/2017 1136   BASOSABS 0.0 04/29/2020 2141   BASOSABS 0.1 02/02/2017 1136    BMET    Component Value Date/Time   NA 136 05/11/2020 0024   NA 143 05/18/2017 1218   NA 143 02/02/2017 1137   K 4.6 05/11/2020 0024   K 3.8 02/02/2017 1137   CL 101 05/11/2020 0024   CO2 19 (L) 05/11/2020 0024   CO2 24 02/02/2017 1137   GLUCOSE 93 05/11/2020 0024   GLUCOSE 74 02/02/2017 1137   BUN 66 (H)  05/11/2020 0024   BUN 76 (HH) 05/18/2017 1218   BUN 63.4 (H) 02/02/2017 1137   CREATININE 7.97 (H) 05/11/2020 0024   CREATININE 5.12 (H) 02/13/2020 1518   CREATININE 3.0 (HH) 02/02/2017 1137   CALCIUM 8.5 (L) 05/11/2020 0024   CALCIUM 9.9 02/02/2017 1137   GFRNONAA 5 (L) 05/11/2020 0024   GFRNONAA 8 (L) 02/13/2020 1518   GFRAA 9 (L) 02/13/2020 1518    INR    Component Value Date/Time   INR 1.3 (H) 05/06/2020 0625     Intake/Output Summary (Last 24 hours) at 05/11/2020 1239 Last data filed at 05/10/2020 1912 Gross per 24 hour  Intake --  Output 545 ml  Net -545 ml     Assessment/Plan:  75 y.o. female is s/p rightgroin sartorius muscle flapsecondary to right groin wound infection.She is now POD 40from initial wash-out and debridement. She is seven weeks post-op right EIA and rightcommon femoral endarterectomy with Dacron patch angioplasty, right femoral to anterior tibial bypass with 6 mm ringed propatent Gore-Tex graft 17 Days Post-Op. Wound VAC change today. Wound is healthy appearing. Next Wound VAC change on Monday. Continue right foot dressing changes daily. RLE is well perfused and warm with good doppler signals. Hemodynamically stable.  HD per Nephrology. Appreciate palliative care assistance  Karoline Caldwell, [facility] [phone] 05/11/2020 12:39 PM   I have examined the patient, reviewed and agree with above.  Curt Jews, MD 05/11/2020 4:06 PM

## 2020-05-11 NOTE — Progress Notes (Signed)
PROGRESS NOTE    [name]  ZOX:[id number] DOB: May 09, 1945 DOA: 04/30/2020 PCP: Unk Pinto, MD   Brief Narrative:  75 year old with history of ESRD on hemodialysis, status post right external iliac and common femoral enterectomy with right femoral to anterior tibial artery bypass, insulin-dependent diabetes mellitus, type II, essential hypertension, moderate pulmonary pretension, presented with infected right groin surgical wound on 04/23/2020 status post I&D and wound VAC placement.  She was started on antibiotics by vascular surgery.  TRH consulted for acute metabolic encephalopathy. Assessment & Plan   Acute metabolic encephalopathy versus acute delirium -Possibly due to polypharmacy -MRI obtained and was unremarkable for CVA.  It did show chronic ischemic changes -Speech consulted, evaluation was unremarkable -PT recommending SNF -Improved, currently alert and oriented x3  Right-sided pleural effusion CT chest showed large right pleural effusion, small to moderate left pleural effusion with compressive atelectasis.  Subtotal collapse of the right lower lobe.  No superimposed focal pulmonary infiltrate. -Currently asymptomatic  Right groin surgical wound infection -Per primary, vascular surgery  Diabetes mellitus, type II -Hemoglobin A1c 4.9 -Patient likely should go home on no oral hypoglycemic agents  History of pulmonary hypertension -Echocardiogram showed an EF 50 to 55%.  LV demonstrates regional wall motion abnormalities.  Grade 2 diastolic dysfunction.  RV systolic pressure 81.19 mmHg -Continue to monitor   Epistaxis/ Anemia of chronic disease -Hemoglobin 8.2, continue to monitor CBC -INR on 05/06/20 was 1.3  ESRD -Patient dialyzes Tuesday, Thursday, Saturday -Nephrology consulted and appreciated   Goals of care -Palliative care consulted per vascular surgery -Now DNR  Stage I pressure injury  Pressure Injury 04/17/2020 Buttocks Right;Left;Mid Stage 1 -   Intact skin with non-blanchable redness of a localized area usually over a bony prominence. redden and tender to touch (Active)  04/29/2020 1930  Location: Buttocks  Location Orientation: Right;Left;Mid  Staging: Stage 1 -  Intact skin with non-blanchable redness of a localized area usually over a bony prominence.  Wound Description (Comments): redden and tender to touch  Present on Admission:     DVT Prophylaxis  per primary  Code Status: DNR  Family Communication: None at bedside  Disposition Plan:  Status is: Inpatient  Remains inpatient appropriate because:Inpatient level of care appropriate due to severity of illness   Dispo: Per primary   Consultants [facility] Nephrology Palliative care   Procedures  Echocardiogram  Antibiotics   Anti-infectives (From admission, onward)   Start     Dose/Rate Route Frequency Ordered Stop   04/25/20 1430  ceFEPIme (MAXIPIME) 1 g in sodium chloride 0.9 % 100 mL IVPB        1 g 200 mL/hr over 30 Minutes Intravenous Every 24 hours 04/25/20 1334     04/10/2020 1200  vancomycin (VANCOCIN) IVPB 750 mg/150 ml premix  Status:  Discontinued        750 mg 150 mL/hr over 60 Minutes Intravenous Every T-Th-Sa (Hemodialysis) 04/23/20 0145 04/25/20 1334   05/01/2020 1100  vancomycin (VANCOREADY) IVPB 1250 mg/250 mL        1,250 mg 166.7 mL/hr over 90 Minutes Intravenous  Once 04/11/2020 1003 04/23/2020 1243   04/23/20 0800  piperacillin-tazobactam (ZOSYN) IVPB 3.375 g  Status:  Discontinued        3.375 g 12.5 mL/hr over 240 Minutes Intravenous Every 8 hours 04/23/20 0753 04/23/20 0759   04/23/20 0800  piperacillin-tazobactam (ZOSYN) IVPB 2.25 g  Status:  Discontinued        2.25 g 100 mL/hr over 30 Minutes  Intravenous Every 8 hours 04/23/20 0800 04/25/20 1334   04/23/20 0200  vancomycin (VANCOREADY) IVPB 1250 mg/250 mL  Status:  Discontinued        1,250 mg 166.7 mL/hr over 90 Minutes Intravenous  Once 04/23/20 0145 04/30/2020 1003      Subjective:    [name] seen and examined today.  Tells me she is tired and does not feel well this morning.  Cannot elaborate.  Denies chest pain or shortness of breath, nausea or vomiting, dizziness or headache.    Objective:   Vitals:   05/11/20 0009 05/11/20 0400 05/11/20 0800 05/11/20 1102  BP: (!) 123/53 (!) 129/52 (!) 133/51 (!) 122/47  Pulse: 88 89 83 87  Resp: 18 16 18 14   Temp: (!) 97.3 F (36.3 C) (!) 97.5 F (36.4 C) 97.6 F (36.4 C) (!) 97.5 F (36.4 C)  TempSrc: Oral Oral Oral Oral  SpO2: 98% 100% 99% 98%  Weight:        Intake/Output Summary (Last 24 hours) at 05/11/2020 1440 Last data filed at 05/10/2020 1912 Gross per 24 hour  Intake --  Output 45 ml  Net -45 ml   Filed Weights   05/07/20 1540 05/07/20 1755 05/09/20 0536  Weight: 59.8 kg 58.8 kg 58 kg   Exam  General: Well developed, frail, chronically ill-appearing, NAD  HEENT: NCAT, mucous membranes moist.   Cardiovascular: S1 S2 auscultated, RRR  Respiratory: Diminished however clear  Abdomen: Soft, nontender, nondistended, + bowel sounds  Extremities: warm dry without cyanosis clubbing or edema  Neuro: AAOx3, nonfocal  Psych: flat  Data Reviewed: I have personally reviewed following labs and imaging studies  CBC: Recent Labs  Lab 05/08/20 0038 05/09/20 0131 05/10/20 0125 05/10/20 1746 05/11/20 0024  WBC 8.7 11.9* 9.6 8.1 8.3  HGB 7.6* 7.9* 7.7* 8.1* 8.2*  HCT 22.4* 24.6* 24.3* 24.2* 25.7*  MCV 97.4 101.2* 100.8* 99.6 100.8*  PLT 158 171 150 145* 176*   Basic Metabolic Panel: Recent Labs  Lab 05/05/20 0909 05/07/20 1509 05/09/20 1056 05/10/20 0125 05/11/20 0024  NA 134* 133* 135 136 136  K 4.7 4.1 4.7 4.6 4.6  CL 99 98 99 100 101  CO2 20* 19* 19* 19* 19*  GLUCOSE 95 140* 96 120* 93  BUN 58* 64* 68* 77* 66*  CREATININE 6.80* 7.24* 8.08* 8.60* 7.97*  CALCIUM 7.8* 8.4* 8.7* 8.7* 8.5*  PHOS 6.3* 5.3* 5.4* 6.0* 5.5*   GFR: Estimated Creatinine Clearance: 5.3 mL/min (A) (by C-G  formula based on SCr of 7.97 mg/dL (H)). Liver Function Tests: Recent Labs  Lab 05/05/20 0909 05/07/20 1509 05/09/20 1056 05/10/20 0125 05/11/20 0024  ALBUMIN 2.0* 2.1* 2.1* 2.1* 2.3*   No results for input(s): LIPASE, AMYLASE in the last 168 hours. No results for input(s): AMMONIA in the last 168 hours. Coagulation Profile: Recent Labs  Lab 05/06/20 0625  INR 1.3*   Cardiac Enzymes: No results for input(s): CKTOTAL, CKMB, CKMBINDEX, TROPONINI in the last 168 hours. BNP (last 3 results) No results for input(s): PROBNP in the last 8760 hours. HbA1C: No results for input(s): HGBA1C in the last 72 hours. CBG: Recent Labs  Lab 05/10/20 1656 05/10/20 2106 05/11/20 0056 05/11/20 0548 05/11/20 1104  GLUCAP 101* 78 86 92 81   Lipid Profile: No results for input(s): CHOL, HDL, LDLCALC, TRIG, CHOLHDL, LDLDIRECT in the last 72 hours. Thyroid Function Tests: No results for input(s): TSH, T4TOTAL, FREET4, T3FREE, THYROIDAB in the last 72 hours. Anemia Panel: No results for  input(s): VITAMINB12, FOLATE, FERRITIN, TIBC, IRON, RETICCTPCT in the last 72 hours. Urine analysis:    Component Value Date/Time   COLORURINE DARK YELLOW 02/13/2020 1518   APPEARANCEUR TURBID (A) 02/13/2020 1518   LABSPEC 1.015 02/13/2020 1518   PHURINE 6.5 02/13/2020 1518   GLUCOSEU NEGATIVE 02/13/2020 1518   HGBUR 2+ (A) 02/13/2020 1518   BILIRUBINUR NEGATIVE 08/25/2018 0045   KETONESUR NEGATIVE 02/13/2020 1518   PROTEINUR 3+ (A) 02/13/2020 1518   UROBILINOGEN 0.2 05/30/2009 1500   NITRITE NEGATIVE 02/13/2020 1518   LEUKOCYTESUR 3+ (A) 02/13/2020 1518   Sepsis Labs: @LABRCNTIP (procalcitonin:4,lacticidven:4)  ) Recent Results (from the past 240 hour(s))  SARS Coronavirus 2 by RT PCR (hospital order, performed in [place] hospital lab) Nasopharyngeal Nasopharyngeal Swab     Status: None   Collection Time: 05/03/20  6:01 PM   Specimen: Nasopharyngeal Swab  Result Value Ref Range Status   SARS  Coronavirus 2 NEGATIVE NEGATIVE Final    Comment: (NOTE) SARS-CoV-2 target nucleic acids are NOT DETECTED.  The SARS-CoV-2 RNA is generally detectable in upper and lower respiratory specimens during the acute phase of infection. The lowest concentration of SARS-CoV-2 viral copies this assay can detect is 250 copies / mL. A negative result does not preclude SARS-CoV-2 infection and should not be used as the sole basis for treatment or other patient management decisions.  A negative result may occur with improper specimen collection / handling, submission of specimen other than nasopharyngeal swab, presence of viral mutation(s) within the areas targeted by this assay, and inadequate number of viral copies (<250 copies / mL). A negative result must be combined with clinical observations, patient history, and epidemiological information.  Fact Sheet for Patients:   StrictlyIdeas.no  Fact Sheet for Healthcare Providers: BankingDealers.co.za  This test is not yet approved or  cleared by the [place] FDA and has been authorized for detection and/or diagnosis of SARS-CoV-2 by FDA under an Emergency Use Authorization (EUA).  This EUA will remain in effect (meaning this test can be used) for the duration of the COVID-19 declaration under Section 564(b)(1) of the Act, 21 U.S.C. section 360bbb-3(b)(1), unless the authorization is terminated or revoked sooner.  Performed at [facility], [place] [address] [place], [place] [address]   Culture, blood (routine x 2)     Status: None   Collection Time: 05/03/20  8:30 PM   Specimen: BLOOD  Result Value Ref Range Status   Specimen Description BLOOD THUMB  Final   Special Requests   Final    AEROBIC BOTTLE ONLY Blood Culture results may not be optimal due to an inadequate volume of blood received in culture bottles   Culture   Final    NO GROWTH 5 DAYS Performed at [facility],  [place] [address], [place], [place] [address]    Report Status 05/09/2020 FINAL  Final  Culture, blood (routine x 2)     Status: None   Collection Time: 05/03/20  8:50 PM   Specimen: BLOOD LEFT HAND  Result Value Ref Range Status   Specimen Description BLOOD LEFT HAND  Final   Special Requests   Final    AEROBIC BOTTLE ONLY Blood Culture results may not be optimal due to an inadequate volume of blood received in culture bottles   Culture   Final    NO GROWTH 5 DAYS Performed at [facility], [place] [address], [place], [place] [address]    Report Status 05/09/2020 FINAL  Final      Radiology Studies: No results found.   Scheduled Meds: . allopurinol  100 mg Oral BID  . aspirin EC  81 mg Oral Daily  . atorvastatin  10 mg Oral Daily  . Chlorhexidine Gluconate Cloth  6 each Topical Q0600  . cinacalcet  30 mg Oral Q supper  . collagenase   Topical Daily  . darbepoetin (ARANESP) injection - DIALYSIS  100 mcg Intravenous Q Thu-HD  . docusate sodium  100 mg Oral BID  . feeding supplement (NEPRO CARB STEADY)   Oral BID  . isosorbide dinitrate  10 mg Oral TID  . melatonin  3 mg Oral QHS  . multivitamin  1 tablet Oral QHS  . pantoprazole  40 mg Oral Daily  . sevelamer carbonate  1.6 g Oral TID WC   Continuous Infusions: . ceFEPime (MAXIPIME) IV 1 g (05/11/20 1025)     LOS: 18 days   Time Spent in minutes   30 minutes  Dietrich Ke D.O. on 05/11/2020 at 2:40 PM  Between 7am to 7pm - Please see pager noted on [email or web address]  After 7pm go to [email or web address]  And look for the night coverage person covering for me after hours  [facility] Office  [phone]

## 2020-05-11 NOTE — Progress Notes (Signed)
Patient ID: [name], female   DOB: [birth year], 75 y.o.   MRN: [id number] S: No events overnight. O:BP (!) 133/51 (BP Location: Left Arm)   Pulse 83   Temp 97.6 F (36.4 C) (Oral)   Resp 18   Wt 58 kg   SpO2 99%   BMI 21.95 kg/m   Intake/Output Summary (Last 24 hours) at 05/11/2020 1027 Last data filed at 05/10/2020 1912 Gross per 24 hour  Intake --  Output 545 ml  Net -545 ml   Intake/Output: I/O last 3 completed shifts: In: -  Out: 560 [Drains:60; Other:500]  Intake/Output this shift:  No intake/output data recorded. Weight change:  Gen: chronically ill-appearing female in NAD CVS: RRR Resp: cta Abd: benign Ext: no edema, RAVF +T/B  Recent Labs  Lab 05/05/20 0909 05/07/20 1509 05/09/20 1056 05/10/20 0125 05/11/20 0024  NA 134* 133* 135 136 136  K 4.7 4.1 4.7 4.6 4.6  CL 99 98 99 100 101  CO2 20* 19* 19* 19* 19*  GLUCOSE 95 140* 96 120* 93  BUN 58* 64* 68* 77* 66*  CREATININE 6.80* 7.24* 8.08* 8.60* 7.97*  ALBUMIN 2.0* 2.1* 2.1* 2.1* 2.3*  CALCIUM 7.8* 8.4* 8.7* 8.7* 8.5*  PHOS 6.3* 5.3* 5.4* 6.0* 5.5*   Liver Function Tests: Recent Labs  Lab 05/09/20 1056 05/10/20 0125 05/11/20 0024  ALBUMIN 2.1* 2.1* 2.3*   No results for input(s): LIPASE, AMYLASE in the last 168 hours. No results for input(s): AMMONIA in the last 168 hours. CBC: Recent Labs  Lab 05/08/20 0038 05/09/20 0131 05/10/20 0125 05/10/20 1746 05/11/20 0024  WBC 8.7 11.9* 9.6 8.1 8.3  HGB 7.6* 7.9* 7.7* 8.1* 8.2*  HCT 22.4* 24.6* 24.3* 24.2* 25.7*  MCV 97.4 101.2* 100.8* 99.6 100.8*  PLT 158 171 150 145* 143*   Cardiac Enzymes: No results for input(s): CKTOTAL, CKMB, CKMBINDEX, TROPONINI in the last 168 hours. CBG: Recent Labs  Lab 05/10/20 0942 05/10/20 1656 05/10/20 2106 05/11/20 0056 05/11/20 0548  GLUCAP 96 101* 78 86 92    Iron Studies: No results for input(s): IRON, TIBC, TRANSFERRIN, FERRITIN in the last 72 hours. Studies/Results: No results found. Marland Kitchen  allopurinol  100 mg Oral BID  . aspirin EC  81 mg Oral Daily  . atorvastatin  10 mg Oral Daily  . Chlorhexidine Gluconate Cloth  6 each Topical Q0600  . cinacalcet  30 mg Oral Q supper  . collagenase   Topical Daily  . darbepoetin (ARANESP) injection - DIALYSIS  100 mcg Intravenous Q Thu-HD  . docusate sodium  100 mg Oral BID  . feeding supplement (NEPRO CARB STEADY)   Oral BID  . isosorbide dinitrate  10 mg Oral TID  . melatonin  3 mg Oral QHS  . multivitamin  1 tablet Oral QHS  . pantoprazole  40 mg Oral Daily  . sevelamer carbonate  1.6 g Oral TID WC    BMET    Component Value Date/Time   NA 136 05/11/2020 0024   NA 143 05/18/2017 1218   NA 143 02/02/2017 1137   K 4.6 05/11/2020 0024   K 3.8 02/02/2017 1137   CL 101 05/11/2020 0024   CO2 19 (L) 05/11/2020 0024   CO2 24 02/02/2017 1137   GLUCOSE 93 05/11/2020 0024   GLUCOSE 74 02/02/2017 1137   BUN 66 (H) 05/11/2020 0024   BUN 76 (HH) 05/18/2017 1218   BUN 63.4 (H) 02/02/2017 1137   CREATININE 7.97 (H) 05/11/2020 0024   CREATININE  5.12 (H) 02/13/2020 1518   CREATININE 3.0 (HH) 02/02/2017 1137   CALCIUM 8.5 (L) 05/11/2020 0024   CALCIUM 9.9 02/02/2017 1137   GFRNONAA 5 (L) 05/11/2020 0024   GFRNONAA 8 (L) 02/13/2020 1518   GFRAA 9 (L) 02/13/2020 1518   CBC    Component Value Date/Time   WBC 8.3 05/11/2020 0024   RBC 2.55 (L) 05/11/2020 0024   HGB 8.2 (L) 05/11/2020 0024   HGB 11.7 05/18/2017 1218   HGB 12.3 02/02/2017 1136   HCT 25.7 (L) 05/11/2020 0024   HCT 34.7 05/18/2017 1218   HCT 37.4 02/02/2017 1136   PLT 143 (L) 05/11/2020 0024   PLT 227 05/18/2017 1218   MCV 100.8 (H) 05/11/2020 0024   MCV 87 05/18/2017 1218   MCV 90.6 02/02/2017 1136   MCH 32.2 05/11/2020 0024   MCHC 31.9 05/11/2020 0024   RDW 22.1 (H) 05/11/2020 0024   RDW 16.0 (H) 05/18/2017 1218   RDW 15.7 (H) 02/02/2017 1136   LYMPHSABS 1.1 04/11/2020 2141   LYMPHSABS 1.8 02/02/2017 1136   MONOABS 0.7 04/10/2020 2141   MONOABS 0.8  02/02/2017 1136   EOSABS 0.1 05/01/2020 2141   EOSABS 0.3 02/02/2017 1136   BASOSABS 0.0 04/30/2020 2141   BASOSABS 0.1 02/02/2017 1136       Dialysis Orders: [facility] TTS IRS85.4 kgHD Bath 2K 2Ca 3 hours 45 minutes,no heparin. AccessRUA AVF Mircera 30 MCG every 2 weeks last on 01/13 units IV/HD Venofer100 mg loading needs 9 more doses   Assessment/Plan: 1. 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 S/pinitial debridement1/17, thenwashout 1/18 by Dr. Donzetta Matters.Per [facility]. 2. AMS: W/U per primary/Neuro. No acute abnormalities on MRI. Avoid sedating medications. Has not missed HD, not uremic.Has been more alert in the afternoons per notes. 3. ESRD:Usually TTS schedule, had HD off scheduleon Monday but now back on schedule. 4. Hypertension/volume-BP controlled. She is currently imdur.Hydralazine stopped yesterday. Does not appear grossly volume overloaded.UF as tolerated.Large pleural effusion on imaging, will try to keep volume down with HD. 5. Anemiaof CKD: Hgbtrended down, now stable in the 7's, no bleeding reported,continue Aranespq Thursday-Dose increased today. HoldingIViron load with infection. 6. Metabolic bone disease -Corrected calciumwithin goal, phos slightly elevated today. Continue Renvela binder, Sensipar 30 daily 7. Nutrition - renal diet with fluid restrictions, vitamins, Nepro 8. History of CAD 9. History of systolic/diastolic heart failure 10. Epistaxis: Likely cause ofHgbdeclinethough no further bleeding reported. INR 1.3, plts ok. Per primary. No heparin with HD. 11. Dispo: Pt remains very frail and debilitated. She expressed that dialysis has become difficult. She was informed that she has the right to stop dialysis if she decides it is not worth the discomfort anymore, but this will lead to death within a few weeks. Palliative care has not seen her or her family for [place] discussion yet. For now, continue  current plan.   Donetta Potts, MD [employer] [phone]

## 2020-05-11 NOTE — Progress Notes (Addendum)
Physical Therapy Treatment Patient Details Name: [name] MRN: [id number] DOB: [birth year] Today's Date: 05/11/2020    History of Present Illness Patient is a 75 y/o female who presents from SNF due to infected groin wound now s/p I&D right groin wound 1/17, s/p washout of wound and Right sartorius muscle flap coverage and wound vac application 0/93/23. MRI negative for acute changes on 05/05/20. PMH includes Lft fem to below knee pop bypass with saphenous vein, toe amputations, breast ca, left shoulder dislocation, CKD, CAD, STEMI, OA.    PT Comments    Pt received in supine, oriented to self and reason for admission but remains somewhat confused and needs increased processing time and safety cues for all mobility/transfers. She had good tolerance for mobility and initiation for BLE exercises as detailed below, no quad lag during straight leg raises but does endorse some sacral discomfort in bed. Pt performed bed mobility with min guard, sit<>stand with +22minA and short gait task to chair with RW and +2 min to modA, pt has most difficulty managing RW and with backward steps due to posterior lean in stance and quick to fatigue. Pt reporting increased feeling of malaise once up in chair but VSS and denies nausea, RN notified pt could benefit from geomat cushion ordered for chair and may need assist/encouragement to eat lunch. Pt continues to benefit from PT services to progress toward functional mobility goals. Continue to recommend SNF, DME recommendations updated below per discussion with supervising PT as family is currently having goals of care discussion with palliative team per chart review.   Follow Up Recommendations  SNF;Supervision for mobility/OOB     Equipment Recommendations  Rolling walker with 5" wheels;3in1 (PT);Wheelchair (measurements PT);Wheelchair cushion (measurements PT) ((if family instead chooses home for palliative) roho cushion, may need hospital bed? not sure of home  equipment, will need to confirm with family)    Recommendations for Other Services       Precautions / Restrictions Precautions Precautions: Fall;Other (comment) Precaution Comments: keep right heel offloaded per pt, wound vac, soft BP (darco shoe in room for R foot but no WB orders in) Restrictions Weight Bearing Restrictions: No Other Position/Activity Restrictions: toe weight bearing on right per pt.    Mobility  Bed Mobility Overal bed mobility: Needs Assistance Bed Mobility: Supine to Sit Rolling: Min guard Sidelying to sit: Min guard;+2 for safety/equipment       General bed mobility comments: pt with good effort for bringing legs towards EOB and use of bed rail to perform trunk rise, tactile cues for log roll sequencing needed  Transfers Overall transfer level: Needs assistance Equipment used: Rolling walker (2 wheeled) Transfers: Sit to/from [facility] Sit to Stand: Min assist;+2 safety/equipment;From elevated surface Stand pivot transfers: Min assist;+2 safety/equipment       General transfer comment: elevated bed surface to RW, needs cues for safe hand placement; bed>chair transfer with assist for RW mgmt  Ambulation/Gait Ambulation/Gait assistance: Min assist;Mod assist Gait Distance (Feet): 5 Feet Assistive device: Rolling walker (2 wheeled) Gait Pattern/deviations: Step-to pattern;Decreased step length - right;Decreased step length - left;Decreased stride length;Trunk flexed;Leaning posteriorly Gait velocity: decreased   General Gait Details: bed>chair, small low steps, cues for step sequencing and RW mgmt needed; posterior bias when stepping backward toward chair needing modA for safety   Stairs             Wheelchair Mobility    Modified Rankin (Stroke Patients Only)       Balance  Overall balance assessment: Needs assistance Sitting-balance support: Feet supported;Single extremity supported Sitting balance-Leahy Scale:  Fair Sitting balance - Comments: pt able to sit with hands on knees no LOB during BP assessment EOB   Standing balance support: Bilateral upper extremity supported;During functional activity Standing balance-Leahy Scale: Poor Standing balance comment: x1 posterior LOB stepping toward chair, able to correct with modA and RW                            Cognition Arousal/Alertness: Lethargic (drowsy) Behavior During Therapy: Flat affect Overall Cognitive Status: Impaired/Different from baseline Area of Impairment: Attention;Memory;Following commands;Safety/judgement;Problem solving;Awareness;Orientation                 Orientation Level: Disoriented to;Place;Time Current Attention Level: Focused Memory: Decreased short-term memory Following Commands: Follows one step commands with increased time;Follows one step commands inconsistently Safety/Judgement: Decreased awareness of safety;Decreased awareness of deficits Awareness: Intellectual Problem Solving: Slow processing;Requires verbal cues;Decreased initiation;Difficulty sequencing;Requires tactile cues General Comments: pt with difficulty with paorticipation limited primarily by lethargy, difficulty with attending to task and verbalizations throughout requiring increased time and cuing.      Exercises General Exercises - Lower Extremity Ankle Circles/Pumps: AROM;Both;10 reps;Supine Long Arc Quad: AROM;Both;10 reps;Seated Heel Slides: AROM;Both;10 reps;Supine Hip ABduction/ADduction: AROM;Both;10 reps;Supine Straight Leg Raises: AROM;Both;5 reps;Supine    General Comments General comments (skin integrity, edema, etc.): BP 130/52 (76) supine; BP 124/64 (80) seated; BP 120/61 (76) standing      Pertinent Vitals/Pain Pain Assessment: Faces Faces Pain Scale: Hurts little more Pain Location: sacral discomfort lying in bed Pain Descriptors / Indicators: Discomfort Pain Intervention(s): Monitored during  session;Repositioned    Home Living                      Prior Function            PT Goals (current goals can now be found in the care plan section) Acute Rehab PT Goals Patient Stated Goal: to move better PT Goal Formulation: With patient Time For Goal Achievement: 05/14/20 Potential to Achieve Goals: Fair Progress towards PT goals: Progressing toward goals    Frequency    Min 3X/week      PT Plan Current plan remains appropriate    Co-evaluation              AM-PAC PT "6 Clicks" Mobility   Outcome Measure  Help needed turning from your back to your side while in a flat bed without using bedrails?: A Little Help needed moving from lying on your back to sitting on the side of a flat bed without using bedrails?: A Little Help needed moving to and from a bed to a chair (including a wheelchair)?: A Little Help needed standing up from a chair using your arms (e.g., wheelchair or bedside chair)?: A Little Help needed to walk in hospital room?: A Lot Help needed climbing 3-5 steps with a railing? : Total 6 Click Score: 15    End of Session Equipment Utilized During Treatment: Gait belt Activity Tolerance: Patient tolerated treatment well;Patient limited by fatigue;Other (comment) Patient left: in chair;with call bell/phone within reach;with chair alarm set Nurse Communication: Mobility status (needs geomat cushion for room) PT Visit Diagnosis: Unsteadiness on feet (R26.81);Muscle weakness (generalized) (M62.81);Difficulty in walking, not elsewhere classified (R26.2);Other abnormalities of gait and mobility (R26.89) Pain - Right/Left: Right     Time: 8280-0349 PT Time Calculation (min) (ACUTE ONLY): 26 min  Charges:  $  Therapeutic Exercise: 8-22 mins $Therapeutic Activity: 8-22 mins                     Alizeh Madril P., PTA Acute Rehabilitation Services Pager: [phone] Office: [place] 05/11/2020, 1:33 PM

## 2020-05-11 NOTE — Consult Note (Signed)
Palliative Medicine Inpatient Consult Note  Reason for consult:  Goals of Care  HPI:  Per Hospitalist Note--> 75 year old with history of ESRD on hemodialysis, status post right external iliac and common femoral enterectomy with right femoral to anterior tibial artery bypass, insulin-dependent diabetes mellitus, type II, essential hypertension, moderate pulmonary pretension, presented with infected right groin surgical wound on 04/23/2020 status post I&D and wound VAC placement.  She was started on antibiotics by vascular surgery.  Remains to be doing poorly and is encephalopathic.  Palliative care has been asked to get involved to discuss goals of care. Vascular surgery has informed the patients family of this consultation and that topics such as hospice will be discussed.  Clinical Assessment/Goals of Care:  *Please note that this is a verbal dictation therefore any spelling or grammatical errors are due to the "[employer]" system interpretation.  I have reviewed medical records including EPIC notes, labs and imaging, received report from bedside RN, assessed the patient who was pleasantly confused this morning.    I called [name] (spouse) and [name] (son)  to further discuss diagnosis prognosis, GOC, EOL wishes, disposition and options.   I introduced Palliative Medicine as specialized medical care for people living with serious illness. It focuses on providing relief from the symptoms and stress of a serious illness. The goal is to improve quality of life for both the patient and the family.  [name] shares with me that [name] is from [place], [place].  They have been married for the last 21 years.  They share one son together rent who lives in [place], [place].  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  She is identified to have been a great help.  [name] is  identified to be of the [employer].  From a functional perspective [name] has struggled for quite some time (years).  Per [name] over the last 15 years she has had one thing after another happen which is led to her being "quite sick for a while".  [name] states that [name] was able to perform basic hygiene at home and perform basic tasks though he was responsible for doing all the housecleaning and cooking.  We discussed [name]'s vascular procedure over a month ago -  status post right external iliac and common femoral endarterectomy with right femoral to anterior tibial artery bypass. We reviewed the complicates endured since this time. We reviewed her chronic co morbid conditions inclusive of Type II DM, CAD, iCMY, arthritis, and h/o breast CA.   A detailed discussion was had today regarding advanced directives - we have none on file though patients surrogate decision maker has been her husband, [name].    Concepts specific to code status, artifical feeding and hydration, continued IV antibiotics and rehospitalization was had.  [name] and I discussed how traumatic a code would be for [name] given her frailty. He stated that he knows she would not want resuscitation.   The difference between a aggressive medical intervention path  and a palliative comfort care path for this patient at this time was had. [name] shares that [name] has struggled for a "long time". He expresses that the patients sister and he feel that she would not wish to live like this.  We talked about transition to comfort measures in house and what that would entail inclusive of medications to control pain, dyspnea, agitation, nausea, itching,  and hiccups.  We discussed stopping all uneccessary measures such as hemodialysis, IV abx, blood draws, needle sticks, and frequent vital signs. We focused a lot on stopping HD as this would most certainly make her time shorter. [name] understands this concept as does his son. They are not at this time ready to  make a decision though they plan to talk later today and inform the PMT when a decision is reached.  Discussed the importance of continued conversation with family and their  medical providers regarding overall plan of care and treatment options, ensuring decisions are within the context of the patients values and GOCs.  Decision Maker: [name] (spouse) [phone]  SUMMARY OF RECOMMENDATIONS   DNAR/DNI  Patients husband and son are determining the next best steps for [name]. They share that she has struggled with her health ailment for sometime now. Determining if continuing HD is consistent with what they know she wants.  Ongoing PMT discussions  Code Status/Advance Care Planning: DNAR/DNI   Palliative Prophylaxis:   Oral care, mobility, pain, delirium  Additional Recommendations (Limitations, Scope, Preferences):  Continue to treat the treatable   Psycho-social/Spiritual:   Desire for further Chaplaincy support: Yes - Christian  Additional Recommendations: Education on end of life when HD is stopped   Prognosis: Poor in the setting of multiple co-morbidities inclusive of ESRD HD dependant  Discharge Planning: Unclear at the present time  Vitals:   05/11/20 0009 05/11/20 0400  BP: (!) 123/53 (!) 129/52  Pulse: 88 89  Resp: 18 16  Temp: (!) 97.3 F (36.3 C) (!) 97.5 F (36.4 C)  SpO2: 98% 100%    Intake/Output Summary (Last 24 hours) at 05/11/2020 1191 Last data filed at 05/10/2020 1912 Gross per 24 hour  Intake -  Output 545 ml  Net -545 ml   Last Weight  Most recent update: 05/09/2020  5:36 AM   Weight  58 kg (127 lb 13.9 oz)           Gen:  Frail elderly F in NAD HEENT: dry mucous membranes CV: Regular rate and rhythm  PULM: clear to auscultation bilaterally  ABD: soft/nontender  EXT: (+) R groin wound vac Neuro: Alert and oriented to person though oscillates in conversation  PPS: 10% - very poor PO's   This conversation/these recommendations  were discussed with patient primary care team, Dr. Donzetta Matters  Time In: 0650 Time Out: 0800 Total Time: 70 Greater than 50%  of this time was spent counseling and coordinating care related to the above assessment and plan.  [place] Team Team Cell Phone: [phone] Please utilize secure chat with additional questions, if there is no response within 30 minutes please call the above phone number  Palliative Medicine Team providers are available by phone from 7am to 7pm daily and can be reached through the team cell phone.  Should this patient require assistance outside of these hours, please call the patient's attending physician.

## 2020-05-11 NOTE — Progress Notes (Signed)
Patient with right nostril bleed.  Patient had blood on her fingers, patient advised that she had picked at her nose because it was something in it and she couldn't breath  RN cleaned patient up & educated patient on importance of not picking where had previously bleed or would continue to bleed  RN will continue to monitor patient  Patient reports that she has phlegm in her throat that she feels that she can not get up.

## 2020-05-12 DIAGNOSIS — Z515 Encounter for palliative care: Secondary | ICD-10-CM | POA: Diagnosis not present

## 2020-05-12 DIAGNOSIS — Z7189 Other specified counseling: Secondary | ICD-10-CM | POA: Diagnosis not present

## 2020-05-12 DIAGNOSIS — Z66 Do not resuscitate: Secondary | ICD-10-CM | POA: Diagnosis not present

## 2020-05-12 DIAGNOSIS — L8990 Pressure ulcer of unspecified site, unspecified stage: Secondary | ICD-10-CM | POA: Diagnosis not present

## 2020-05-12 DIAGNOSIS — Z9889 Other specified postprocedural states: Secondary | ICD-10-CM | POA: Diagnosis not present

## 2020-05-12 DIAGNOSIS — G9341 Metabolic encephalopathy: Secondary | ICD-10-CM | POA: Diagnosis not present

## 2020-05-12 DIAGNOSIS — J90 Pleural effusion, not elsewhere classified: Secondary | ICD-10-CM | POA: Diagnosis not present

## 2020-05-12 LAB — CBC
HCT: 25.7 % — ABNORMAL LOW (ref 36.0–46.0)
Hemoglobin: 8.3 g/dL — ABNORMAL LOW (ref 12.0–15.0)
MCH: 32.4 pg (ref 26.0–34.0)
MCHC: 32.3 g/dL (ref 30.0–36.0)
MCV: 100.4 fL — ABNORMAL HIGH (ref 80.0–100.0)
Platelets: 141 10*3/uL — ABNORMAL LOW (ref 150–400)
RBC: 2.56 MIL/uL — ABNORMAL LOW (ref 3.87–5.11)
RDW: 21.8 % — ABNORMAL HIGH (ref 11.5–15.5)
WBC: 7.2 10*3/uL (ref 4.0–10.5)
nRBC: 3.3 % — ABNORMAL HIGH (ref 0.0–0.2)

## 2020-05-12 LAB — GLUCOSE, CAPILLARY: Glucose-Capillary: 88 mg/dL (ref 70–99)

## 2020-05-12 MED ORDER — LORAZEPAM 2 MG/ML IJ SOLN
2.0000 mg | Freq: Once | INTRAMUSCULAR | Status: AC
  Administered 2020-05-12: 2 mg via INTRAVENOUS

## 2020-05-12 MED ORDER — CITALOPRAM HYDROBROMIDE 20 MG PO TABS: 10.0000 mg | ORAL_TABLET | Freq: Every day | ORAL | Status: DC

## 2020-05-12 MED ORDER — BISACODYL 10 MG RE SUPP: 10.0000 mg | Freq: Every day | RECTAL | Status: DC | PRN

## 2020-05-12 MED ORDER — ACETAMINOPHEN 650 MG RE SUPP: 650.0000 mg | RECTAL | Status: DC | PRN

## 2020-05-12 MED ORDER — GLYCOPYRROLATE 0.2 MG/ML IJ SOLN: 0.4000 mg | INTRAMUSCULAR | Status: DC | PRN

## 2020-05-12 MED ORDER — LORAZEPAM 2 MG/ML IJ SOLN
0.5000 mg | INTRAMUSCULAR | Status: DC | PRN
  Administered 2020-05-14 – 2020-05-15 (×2): 1 mg via INTRAVENOUS
  Filled 2020-05-12 (×4): qty 1

## 2020-05-12 MED ORDER — FENTANYL CITRATE (PF) 100 MCG/2ML IJ SOLN
25.0000 ug | INTRAMUSCULAR | Status: DC | PRN
  Administered 2020-05-12 – 2020-05-15 (×3): 50 ug via INTRAVENOUS
  Filled 2020-05-12 (×3): qty 2

## 2020-05-12 MED ORDER — HYPROMELLOSE (GONIOSCOPIC) 2.5 % OP SOLN
2.0000 [drp] | Freq: Four times a day (QID) | OPHTHALMIC | Status: DC | PRN
  Filled 2020-05-12: qty 15

## 2020-05-12 NOTE — Progress Notes (Addendum)
Progress Note    05/12/2020 8:45 AM 18 Days Post-Op  Subjective:  No complaints of pain. Asking for assistance to sit up and eat.   Vitals:   05/12/20 0623 05/12/20 0801  BP:  117/66  Pulse:  80  Resp: 17 20  Temp: 97.7 F (36.5 C) (!) 97.3 F (36.3 C)  SpO2:  98%    Physical Exam: Cardiac:  RRR Lungs:  Non labored Incisions: right lower extremity incisions healing well. Right groin wound VAC in place with good seal. Clear, thin bloody drainage. She has stable non expanding hematoma in the right upper thigh. Brisk DP doppler signal bilaterally. RUE: + thrill in forearm AVF    CBC    Component Value Date/Time   WBC 7.2 05/12/2020 0139   RBC 2.56 (L) 05/12/2020 0139   HGB 8.3 (L) 05/12/2020 0139   HGB 11.7 05/18/2017 1218   HGB 12.3 02/02/2017 1136   HCT 25.7 (L) 05/12/2020 0139   HCT 34.7 05/18/2017 1218   HCT 37.4 02/02/2017 1136   PLT 141 (L) 05/12/2020 0139   PLT 227 05/18/2017 1218   MCV 100.4 (H) 05/12/2020 0139   MCV 87 05/18/2017 1218   MCV 90.6 02/02/2017 1136   MCH 32.4 05/12/2020 0139   MCHC 32.3 05/12/2020 0139   RDW 21.8 (H) 05/12/2020 0139   RDW 16.0 (H) 05/18/2017 1218   RDW 15.7 (H) 02/02/2017 1136   LYMPHSABS 1.1 05/02/2020 2141   LYMPHSABS 1.8 02/02/2017 1136   MONOABS 0.7 05/05/2020 2141   MONOABS 0.8 02/02/2017 1136   EOSABS 0.1 04/26/2020 2141   EOSABS 0.3 02/02/2017 1136   BASOSABS 0.0 04/23/2020 2141   BASOSABS 0.1 02/02/2017 1136    BMET    Component Value Date/Time   NA 136 05/11/2020 0024   NA 143 05/18/2017 1218   NA 143 02/02/2017 1137   K 4.6 05/11/2020 0024   K 3.8 02/02/2017 1137   CL 101 05/11/2020 0024   CO2 19 (L) 05/11/2020 0024   CO2 24 02/02/2017 1137   GLUCOSE 93 05/11/2020 0024   GLUCOSE 74 02/02/2017 1137   BUN 66 (H) 05/11/2020 0024   BUN 76 (HH) 05/18/2017 1218   BUN 63.4 (H) 02/02/2017 1137   CREATININE 7.97 (H) 05/11/2020 0024   CREATININE 5.12 (H) 02/13/2020 1518   CREATININE 3.0 (HH) 02/02/2017  1137   CALCIUM 8.5 (L) 05/11/2020 0024   CALCIUM 9.9 02/02/2017 1137   GFRNONAA 5 (L) 05/11/2020 0024   GFRNONAA 8 (L) 02/13/2020 1518   GFRAA 9 (L) 02/13/2020 1518     Intake/Output Summary (Last 24 hours) at 05/12/2020 0845 Last data filed at 05/12/2020 0700 Gross per 24 hour  Intake 440 ml  Output 175 ml  Net 265 ml    HOSPITAL MEDICATIONS Scheduled Meds: . allopurinol  100 mg Oral BID  . aspirin EC  81 mg Oral Daily  . atorvastatin  10 mg Oral Daily  . Chlorhexidine Gluconate Cloth  6 each Topical Q0600  . cinacalcet  30 mg Oral Q supper  . citalopram  10 mg Oral Daily  . collagenase   Topical Daily  . darbepoetin (ARANESP) injection - DIALYSIS  100 mcg Intravenous Q Thu-HD  . docusate sodium  100 mg Oral BID  . feeding supplement (NEPRO CARB STEADY)   Oral BID  . isosorbide dinitrate  10 mg Oral TID  . melatonin  3 mg Oral QHS  . multivitamin  1 tablet Oral QHS  . pantoprazole  40  mg Oral Daily  . sevelamer carbonate  1.6 g Oral TID WC   Continuous Infusions: . ceFEPime (MAXIPIME) IV Stopped (05/11/20 1055)   PRN Meds:.acetaminophen, alum & mag hydroxide-simeth, bisacodyl, cyclobenzaprine, guaiFENesin-dextromethorphan, hydrALAZINE, menthol-cetylpyridinium, ondansetron, oxyCODONE-acetaminophen, phenol, sodium phosphate  Assessment and Plan: POD 22  75 y.o. female is s/p rightgroin sartorius muscle flapsecondary to right groin wound infection.She is now POD 63from initial wash-out and debridement. VSS. Afebrile  She is seven weeks post-op right EIA and rightcommon femoral endarterectomy with Dacron patch angioplasty, right femoral to anterior tibial bypass with 6 mm ringed propatent Gore-Tex graft  Healthy appearing right groin wound at dressing change yesterday. Next Wound VAC change on Monday. Continue right foot dressing changes daily. RLE is well perfused and warm with good doppler signals.   Multifactorial anemia; slow rise in H and H  HD per Nephrology.    Appreciate palliative care assistance       [name], PA-C [facility] [phone] 05/12/2020  8:45 AM   I have seen and evaluated the patient. I agree with the PA note as documented above.  75 year old female status post right femoral endarterectomy with patch angioplasty and a right femoral to tibial bypass.  This got infected and ultimately required washout with muscle flap.  VAC is holding good seal in the right groin.  She does have good Doppler flow in the right foot.  Palliative care meeting tomorrow or Monday with the family for goals of care.  Marty Heck, MD [facility] Office: [phone]

## 2020-05-12 NOTE — Progress Notes (Signed)
   Palliative Medicine Inpatient Follow Up Note  Alerted by nursing staff that [name] had an episode of unresponsiveness while at dialysis this afternoon. Initially a code blue was called then staff realized patient was DNAR.   I spoke directly to patients bedside RN, [name]. She shared that she had called the patients husband to come in sooner than later.  I called [name] as well - we discussed him driving in though her shares that he is driving from [name]. I told him that I believe his wife is in the active phases of dying. We discussed her abnormal heart rhythm and how poorly her mentation now is. We discussed that at this point comfort care needs to be strongly considered. [name] is in agreement and states that we should pursue everything to make her comfortable.   I went to bedside. [name] is noted on her cardiac rhythm to have long pauses with 3-4 beats thereafter. She is altered and unable to converse. She shares with me that she needs her hands over her head to help her breath. Provided ativan and fentanyl to aid in SOB symptoms.   Ongoing PMT support  Objective Assessment: Vital Signs Vitals:   05/12/20 0801 05/12/20 1334  BP: 117/66 101/61  Pulse: 80 (!) 49  Resp: 20 16  Temp: (!) 97.3 F (36.3 C)   SpO2: 98% 99%    Intake/Output Summary (Last 24 hours) at 05/12/2020 1431 Last data filed at 05/12/2020 0700 Gross per 24 hour  Intake 440 ml  Output 175 ml  Net 265 ml   Last Weight  Most recent update: 05/11/2020  9:56 PM   Weight  68 kg (150 lb)           JGG:EZMOQ elderly F in NAD HEENT:drymucous membranes CV: Regular rate and rhythm  PULM: Notable apnea epsisodes  ABD: soft/nontender  EXT:(+) R groin wound vac Neuro: Lethargic  SUMMARY OF RECOMMENDATIONS   DNAR/DNI  Comfort focused care  Comfort medications per [facility]  Liberalize visitation  Chaplain Consult  Anticipate in hospital death within hours  Time Spent: 45 Greater than 50% of the time was spent  in counseling and coordination of care ______________________________________________________________________________________ [name] Team Cell Phone: [phone] Please utilize secure chat with additional questions, if there is no response within 30 minutes please call the above phone number  Palliative Medicine Team providers are available by phone from 7am to 7pm daily and can be reached through the team cell phone.  Should this patient require assistance outside of these hours, please call the patient's attending physician.

## 2020-05-12 NOTE — Progress Notes (Addendum)
Patient ID: [name], female   DOB: [birth year], 75 y.o.   MRN: [id number] S: STates that she is too weak to sit up and eat breakfast. O:BP 117/66 (BP Location: Left Arm)   Pulse 80   Temp (!) 97.3 F (36.3 C) (Axillary)   Resp 20   Wt 68 kg   SpO2 98%   BMI 25.75 kg/m   Intake/Output Summary (Last 24 hours) at 05/12/2020 1106 Last data filed at 05/12/2020 0700 Gross per 24 hour  Intake 440 ml  Output 175 ml  Net 265 ml   Intake/Output: I/O last 3 completed shifts: In: 440 [P.O.:140; IV Piggyback:300] Out: 220 [Drains:220]  Intake/Output this shift:  No intake/output data recorded. Weight change:  Gen: frail, elderly WF very fatigued and weak CVS: RRR Resp: cta Abd: +BS, soft, NT/ND Ext: no edema RAVF +T/B  Recent Labs  Lab 05/07/20 1509 05/09/20 1056 05/10/20 0125 05/11/20 0024  NA 133* 135 136 136  K 4.1 4.7 4.6 4.6  CL 98 99 100 101  CO2 19* 19* 19* 19*  GLUCOSE 140* 96 120* 93  BUN 64* 68* 77* 66*  CREATININE 7.24* 8.08* 8.60* 7.97*  ALBUMIN 2.1* 2.1* 2.1* 2.3*  CALCIUM 8.4* 8.7* 8.7* 8.5*  PHOS 5.3* 5.4* 6.0* 5.5*   Liver Function Tests: Recent Labs  Lab 05/09/20 1056 05/10/20 0125 05/11/20 0024  ALBUMIN 2.1* 2.1* 2.3*   No results for input(s): LIPASE, AMYLASE in the last 168 hours. No results for input(s): AMMONIA in the last 168 hours. CBC: Recent Labs  Lab 05/09/20 0131 05/10/20 0125 05/10/20 1746 05/11/20 0024 05/12/20 0139  WBC 11.9* 9.6 8.1 8.3 7.2  HGB 7.9* 7.7* 8.1* 8.2* 8.3*  HCT 24.6* 24.3* 24.2* 25.7* 25.7*  MCV 101.2* 100.8* 99.6 100.8* 100.4*  PLT 171 150 145* 143* 141*   Cardiac Enzymes: No results for input(s): CKTOTAL, CKMB, CKMBINDEX, TROPONINI in the last 168 hours. CBG: Recent Labs  Lab 05/11/20 0548 05/11/20 1104 05/11/20 1611 05/11/20 2133 05/12/20 0605  GLUCAP 92 81 100* 110* 88    Iron Studies: No results for input(s): IRON, TIBC, TRANSFERRIN, FERRITIN in the last 72 hours. Studies/Results: No results  found. Marland Kitchen allopurinol  100 mg Oral BID  . aspirin EC  81 mg Oral Daily  . atorvastatin  10 mg Oral Daily  . Chlorhexidine Gluconate Cloth  6 each Topical Q0600  . cinacalcet  30 mg Oral Q supper  . citalopram  10 mg Oral Daily  . collagenase   Topical Daily  . darbepoetin (ARANESP) injection - DIALYSIS  100 mcg Intravenous Q Thu-HD  . docusate sodium  100 mg Oral BID  . feeding supplement (NEPRO CARB STEADY)   Oral BID  . isosorbide dinitrate  10 mg Oral TID  . melatonin  3 mg Oral QHS  . multivitamin  1 tablet Oral QHS  . pantoprazole  40 mg Oral Daily  . sevelamer carbonate  1.6 g Oral TID WC    BMET    Component Value Date/Time   NA 136 05/11/2020 0024   NA 143 05/18/2017 1218   NA 143 02/02/2017 1137   K 4.6 05/11/2020 0024   K 3.8 02/02/2017 1137   CL 101 05/11/2020 0024   CO2 19 (L) 05/11/2020 0024   CO2 24 02/02/2017 1137   GLUCOSE 93 05/11/2020 0024   GLUCOSE 74 02/02/2017 1137   BUN 66 (H) 05/11/2020 0024   BUN 76 (HH) 05/18/2017 1218   BUN 63.4 (H) 02/02/2017  1137   CREATININE 7.97 (H) 05/11/2020 0024   CREATININE 5.12 (H) 02/13/2020 1518   CREATININE 3.0 (HH) 02/02/2017 1137   CALCIUM 8.5 (L) 05/11/2020 0024   CALCIUM 9.9 02/02/2017 1137   GFRNONAA 5 (L) 05/11/2020 0024   GFRNONAA 8 (L) 02/13/2020 1518   GFRAA 9 (L) 02/13/2020 1518   CBC    Component Value Date/Time   WBC 7.2 05/12/2020 0139   RBC 2.56 (L) 05/12/2020 0139   HGB 8.3 (L) 05/12/2020 0139   HGB 11.7 05/18/2017 1218   HGB 12.3 02/02/2017 1136   HCT 25.7 (L) 05/12/2020 0139   HCT 34.7 05/18/2017 1218   HCT 37.4 02/02/2017 1136   PLT 141 (L) 05/12/2020 0139   PLT 227 05/18/2017 1218   MCV 100.4 (H) 05/12/2020 0139   MCV 87 05/18/2017 1218   MCV 90.6 02/02/2017 1136   MCH 32.4 05/12/2020 0139   MCHC 32.3 05/12/2020 0139   RDW 21.8 (H) 05/12/2020 0139   RDW 16.0 (H) 05/18/2017 1218   RDW 15.7 (H) 02/02/2017 1136   LYMPHSABS 1.1 04/20/2020 2141   LYMPHSABS 1.8 02/02/2017 1136    MONOABS 0.7 04/18/2020 2141   MONOABS 0.8 02/02/2017 1136   EOSABS 0.1 04/07/2020 2141   EOSABS 0.3 02/02/2017 1136   BASOSABS 0.0 04/29/2020 2141   BASOSABS 0.1 02/02/2017 1136     Dialysis Orders: NWon TTS IWP80.9 kgHD Bath 2K 2Ca 3 hours 45 minutes,no heparin. AccessRUA AVF Mircera 30 MCG every 2 weeks last on 01/13 units IV/HD Venofer100 mg loading needs 9 more doses   Assessment/Plan: 1. 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 S/pinitial debridement1/17, thenwashout 1/18 by Dr. Donzetta Matters.Per [facility]. 2. AMS: W/U per primary/Neuro. No acute abnormalities on MRI. Avoid sedating medications. Has not missed HD, not uremic.Has been more alert in the afternoons per notes. 3. ESRD:Now back on TTS schedule.  Too debilitated for outpatient dialysis as she is unable to sit up for breakfast.  Palliative care meeting in the next 24-48 hours planned. 4. Hypertension/volume-BP controlled. She is currently imdur.Hydralazine stopped yesterday. Does not appear grossly volume overloaded.UF as tolerated.Large pleural effusion on imaging, will try to keep volume down with HD. 5. Anemiaof CKD: Hgbtrended down, now stable in the 7's, no bleeding reported,continue Aranespq Thursday-Dose increasedtoday. HoldingIViron load with infection. 6. Metabolic bone disease -Corrected calciumwithin goal, phos slightly elevated today. Continue Renvela binder, Sensipar 30 daily 7. Nutrition - renal diet with fluid restrictions, vitamins, Nepro 8. History of CAD 9. History of systolic/diastolic heart failure 10. Epistaxis: Likely cause ofHgbdeclinethough no further bleeding reported. INR 1.3, plts ok. Per primary. No heparin with HD. 11. Dispo: Pt remains very frail and debilitated. She expressed that dialysis has become difficult. She was informed that she has the right to stop dialysis if she decides it is not worth the discomfort anymore, but this will  lead to death within a few weeks.Palliative care has a meeting with she and her family [place] discussionfor 2/6 or 05/14/20. For now, continue with HD as tolerated   Donetta Potts, MD [facility] [phone]

## 2020-05-12 NOTE — Code Documentation (Signed)
  Patient Name: [name]   MRN: [id number]   Date of Birth/ Sex: January 29, 1946 , female      Admission Date: 05/04/2020  Attending Provider: Thomes Lolling*  Primary Diagnosis: <principal problem not specified>   Indication: Pt was in her usual state of health until this PM, when she was noted to be in PEA arrest. Patient is DNR as of this morning at 11 am.. Code blue was unfortunately called. At the time of arrival on scene, ACLS protocol was underway.   Technical Description:  - CPR performance duration:  5 minutes  - Was defibrillation or cardioversion used? No   - Was external pacer placed? No  - Was patient intubated pre/post CPR? No   Medications Administered: Y = Yes; Blank = No Amiodarone    Atropine    Calcium    Epinephrine    Lidocaine    Magnesium    Norepinephrine    Phenylephrine    Sodium bicarbonate    Vasopressin     Post CPR evaluation:  - Final Status - Was patient successfully resuscitated ? Yes - What is current rhythm? Sinus - What is current hemodynamic status? Stable  Miscellaneous Information:  - Labs sent, including: None  - Primary team notified?  Yes  - Family Notified? Yes  - Additional notes/ transfer status:  Patient initially resuscitated with compression with ROSC, compression stopped as patient is DNR. Reiterated code status.      Iona Beard, MD  05/12/2020, 8:01 PM

## 2020-05-12 NOTE — Progress Notes (Signed)
PROGRESS NOTE    [name]  VHQ:[id number] DOB: 07-04-1945 DOA: 04/24/2020 PCP: Unk Pinto, MD   Brief Narrative:  75 year old with history of ESRD on hemodialysis, status post right external iliac and common femoral enterectomy with right femoral to anterior tibial artery bypass, insulin-dependent diabetes mellitus, type II, essential hypertension, moderate pulmonary pretension, presented with infected right groin surgical wound on 04/23/2020 status post I&D and wound VAC placement.  She was started on antibiotics by vascular surgery.  TRH consulted for acute metabolic encephalopathy. Assessment & Plan   Acute metabolic encephalopathy versus acute delirium -Possibly due to polypharmacy -MRI obtained and was unremarkable for CVA.  It did show chronic ischemic changes -Speech consulted, evaluation was unremarkable -PT recommending SNF -Improved, currently alert and oriented x3  Right-sided pleural effusion CT chest showed large right pleural effusion, small to moderate left pleural effusion with compressive atelectasis.  Subtotal collapse of the right lower lobe.  No superimposed focal pulmonary infiltrate. -Currently asymptomatic  Right groin surgical wound infection -Per primary, vascular surgery  Diabetes mellitus, type II -Hemoglobin A1c 4.9 -Patient likely should go home on no oral hypoglycemic agents  History of pulmonary hypertension -Echocardiogram showed an EF 50 to 55%.  LV demonstrates regional wall motion abnormalities.  Grade 2 diastolic dysfunction.  RV systolic pressure 41.32 mmHg -Continue to monitor   Epistaxis/ Anemia of chronic disease -Stable, Hemoglobin 8.3, continue to monitor CBC -INR on 05/06/20 was 1.3  ESRD -Patient dialyzes Tuesday, Thursday, Saturday -Nephrology consulted and appreciated   Goals of care -Palliative care consulted per vascular surgery -Now DNR -Palliative care meeting planned for either 05/13/2020 or 05/14/2020 when all  family can be present  Stage I pressure injury  Pressure Injury 04/12/2020 Buttocks Right;Left;Mid Stage 1 -  Intact skin with non-blanchable redness of a localized area usually over a bony prominence. redden and tender to touch (Active)  04/21/2020 1930  Location: Buttocks  Location Orientation: Right;Left;Mid  Staging: Stage 1 -  Intact skin with non-blanchable redness of a localized area usually over a bony prominence.  Wound Description (Comments): redden and tender to touch  Present on Admission:     DVT Prophylaxis  per primary  Code Status: DNR  Family Communication: None at bedside  Disposition Plan:  Status is: Inpatient  Remains inpatient appropriate because:Inpatient level of care appropriate due to severity of illness   Dispo: Per primary   Consultants [facility] Nephrology Palliative care   Procedures  Echocardiogram  Antibiotics   Anti-infectives (From admission, onward)   Start     Dose/Rate Route Frequency Ordered Stop   04/25/20 1430  ceFEPIme (MAXIPIME) 1 g in sodium chloride 0.9 % 100 mL IVPB        1 g 200 mL/hr over 30 Minutes Intravenous Every 24 hours 04/25/20 1334     04/20/2020 1200  vancomycin (VANCOCIN) IVPB 750 mg/150 ml premix  Status:  Discontinued        750 mg 150 mL/hr over 60 Minutes Intravenous Every T-Th-Sa (Hemodialysis) 04/23/20 0145 04/25/20 1334   05/06/2020 1100  vancomycin (VANCOREADY) IVPB 1250 mg/250 mL        1,250 mg 166.7 mL/hr over 90 Minutes Intravenous  Once 04/16/2020 1003 04/18/2020 1243   04/23/20 0800  piperacillin-tazobactam (ZOSYN) IVPB 3.375 g  Status:  Discontinued        3.375 g 12.5 mL/hr over 240 Minutes Intravenous Every 8 hours 04/23/20 0753 04/23/20 0759   04/23/20 0800  piperacillin-tazobactam (ZOSYN) IVPB 2.25 g  Status:  Discontinued        2.25 g 100 mL/hr over 30 Minutes Intravenous Every 8 hours 04/23/20 0800 04/25/20 1334   04/23/20 0200  vancomycin (VANCOREADY) IVPB 1250 mg/250 mL  Status:  Discontinued         1,250 mg 166.7 mL/hr over 90 Minutes Intravenous  Once 04/23/20 0145 04/12/2020 1003      Subjective:   [name] seen and examined today.  Feels tired this morning.  But denies any chest pain or shortness of breath, abdominal pain, nausea or vomiting, dizziness or headache.    Objective:   Vitals:   05/11/20 2351 05/12/20 0512 05/12/20 0623 05/12/20 0801  BP: (!) 127/44 102/70  117/66  Pulse: (!) 29 78  80  Resp: 12 16 17 20   Temp: (!) 97.5 F (36.4 C)  97.7 F (36.5 C) (!) 97.3 F (36.3 C)  TempSrc: Oral Oral  Axillary  SpO2: 99% 99%  98%  Weight:        Intake/Output Summary (Last 24 hours) at 05/12/2020 1101 Last data filed at 05/12/2020 0700 Gross per 24 hour  Intake 440 ml  Output 175 ml  Net 265 ml   Filed Weights   05/07/20 1755 05/09/20 0536 05/11/20 2156  Weight: 58.8 kg 58 kg 68 kg   Exam  General: Well developed, frail, chronically ill-appearing, NAD  HEENT: NCAT, mucous membranes moist.   Cardiovascular: S1 S2 auscultated, RRR  Respiratory: Diminished however clear  Abdomen: Soft, nontender, nondistended, + bowel sounds  Extremities: warm dry without cyanosis clubbing or edema.  Right wound VAC in place.  Neuro: AAOx3, nonfocal  Psych: appropriate mood and affect   Data Reviewed: I have personally reviewed following labs and imaging studies  CBC: Recent Labs  Lab 05/09/20 0131 05/10/20 0125 05/10/20 1746 05/11/20 0024 05/12/20 0139  WBC 11.9* 9.6 8.1 8.3 7.2  HGB 7.9* 7.7* 8.1* 8.2* 8.3*  HCT 24.6* 24.3* 24.2* 25.7* 25.7*  MCV 101.2* 100.8* 99.6 100.8* 100.4*  PLT 171 150 145* 143* 818*   Basic Metabolic Panel: Recent Labs  Lab 05/07/20 1509 05/09/20 1056 05/10/20 0125 05/11/20 0024  NA 133* 135 136 136  K 4.1 4.7 4.6 4.6  CL 98 99 100 101  CO2 19* 19* 19* 19*  GLUCOSE 140* 96 120* 93  BUN 64* 68* 77* 66*  CREATININE 7.24* 8.08* 8.60* 7.97*  CALCIUM 8.4* 8.7* 8.7* 8.5*  PHOS 5.3* 5.4* 6.0* 5.5*   GFR: Estimated  Creatinine Clearance: 5.9 mL/min (A) (by C-G formula based on SCr of 7.97 mg/dL (H)). Liver Function Tests: Recent Labs  Lab 05/07/20 1509 05/09/20 1056 05/10/20 0125 05/11/20 0024  ALBUMIN 2.1* 2.1* 2.1* 2.3*   No results for input(s): LIPASE, AMYLASE in the last 168 hours. No results for input(s): AMMONIA in the last 168 hours. Coagulation Profile: Recent Labs  Lab 05/06/20 0625  INR 1.3*   Cardiac Enzymes: No results for input(s): CKTOTAL, CKMB, CKMBINDEX, TROPONINI in the last 168 hours. BNP (last 3 results) No results for input(s): PROBNP in the last 8760 hours. HbA1C: No results for input(s): HGBA1C in the last 72 hours. CBG: Recent Labs  Lab 05/11/20 0548 05/11/20 1104 05/11/20 1611 05/11/20 2133 05/12/20 0605  GLUCAP 92 81 100* 110* 88   Lipid Profile: No results for input(s): CHOL, HDL, LDLCALC, TRIG, CHOLHDL, LDLDIRECT in the last 72 hours. Thyroid Function Tests: No results for input(s): TSH, T4TOTAL, FREET4, T3FREE, THYROIDAB in the last 72 hours. Anemia Panel: No results for input(s):  VITAMINB12, FOLATE, FERRITIN, TIBC, IRON, RETICCTPCT in the last 72 hours. Urine analysis:    Component Value Date/Time   COLORURINE DARK YELLOW 02/13/2020 1518   APPEARANCEUR TURBID (A) 02/13/2020 1518   LABSPEC 1.015 02/13/2020 1518   PHURINE 6.5 02/13/2020 1518   GLUCOSEU NEGATIVE 02/13/2020 1518   HGBUR 2+ (A) 02/13/2020 1518   BILIRUBINUR NEGATIVE 08/25/2018 0045   KETONESUR NEGATIVE 02/13/2020 1518   PROTEINUR 3+ (A) 02/13/2020 1518   UROBILINOGEN 0.2 05/30/2009 1500   NITRITE NEGATIVE 02/13/2020 1518   LEUKOCYTESUR 3+ (A) 02/13/2020 1518   Sepsis Labs: @LABRCNTIP (procalcitonin:4,lacticidven:4)  ) Recent Results (from the past 240 hour(s))  SARS Coronavirus 2 by RT PCR (hospital order, performed in [place] hospital lab) Nasopharyngeal Nasopharyngeal Swab     Status: None   Collection Time: 05/03/20  6:01 PM   Specimen: Nasopharyngeal Swab  Result  Value Ref Range Status   SARS Coronavirus 2 NEGATIVE NEGATIVE Final    Comment: (NOTE) SARS-CoV-2 target nucleic acids are NOT DETECTED.  The SARS-CoV-2 RNA is generally detectable in upper and lower respiratory specimens during the acute phase of infection. The lowest concentration of SARS-CoV-2 viral copies this assay can detect is 250 copies / mL. A negative result does not preclude SARS-CoV-2 infection and should not be used as the sole basis for treatment or other patient management decisions.  A negative result may occur with improper specimen collection / handling, submission of specimen other than nasopharyngeal swab, presence of viral mutation(s) within the areas targeted by this assay, and inadequate number of viral copies (<250 copies / mL). A negative result must be combined with clinical observations, patient history, and epidemiological information.  Fact Sheet for Patients:   StrictlyIdeas.no  Fact Sheet for Healthcare Providers: BankingDealers.co.za  This test is not yet approved or  cleared by the [place] FDA and has been authorized for detection and/or diagnosis of SARS-CoV-2 by FDA under an Emergency Use Authorization (EUA).  This EUA will remain in effect (meaning this test can be used) for the duration of the COVID-19 declaration under Section 564(b)(1) of the Act, 21 U.S.C. section 360bbb-3(b)(1), unless the authorization is terminated or revoked sooner.  Performed at [facility], [place] [address] [place], [place] [address]   Culture, blood (routine x 2)     Status: None   Collection Time: 05/03/20  8:30 PM   Specimen: BLOOD  Result Value Ref Range Status   Specimen Description BLOOD THUMB  Final   Special Requests   Final    AEROBIC BOTTLE ONLY Blood Culture results may not be optimal due to an inadequate volume of blood received in culture bottles   Culture   Final    NO GROWTH 5  DAYS Performed at [facility], [place] [address], [place], [place] [address]    Report Status 05/09/2020 FINAL  Final  Culture, blood (routine x 2)     Status: None   Collection Time: 05/03/20  8:50 PM   Specimen: BLOOD LEFT HAND  Result Value Ref Range Status   Specimen Description BLOOD LEFT HAND  Final   Special Requests   Final    AEROBIC BOTTLE ONLY Blood Culture results may not be optimal due to an inadequate volume of blood received in culture bottles   Culture   Final    NO GROWTH 5 DAYS Performed at [facility], [place] [address], [place], [place] [address]    Report Status 05/09/2020 FINAL  Final  Radiology Studies: No results found.   Scheduled Meds: . allopurinol  100 mg Oral BID  . aspirin EC  81 mg Oral Daily  . atorvastatin  10 mg Oral Daily  . Chlorhexidine Gluconate Cloth  6 each Topical Q0600  . cinacalcet  30 mg Oral Q supper  . citalopram  10 mg Oral Daily  . collagenase   Topical Daily  . darbepoetin (ARANESP) injection - DIALYSIS  100 mcg Intravenous Q Thu-HD  . docusate sodium  100 mg Oral BID  . feeding supplement (NEPRO CARB STEADY)   Oral BID  . isosorbide dinitrate  10 mg Oral TID  . melatonin  3 mg Oral QHS  . multivitamin  1 tablet Oral QHS  . pantoprazole  40 mg Oral Daily  . sevelamer carbonate  1.6 g Oral TID WC   Continuous Infusions: . ceFEPime (MAXIPIME) IV Stopped (05/11/20 1055)     LOS: 19 days   Time Spent in minutes   30 minutes  Kysa Calais D.O. on 05/12/2020 at 11:01 AM  Between 7am to 7pm - Please see pager noted on [email or web address]  After 7pm go to [email or web address]  And look for the night coverage person covering for me after hours  [facility] Office  [phone]

## 2020-05-12 NOTE — Progress Notes (Signed)
I was notified the patient became unresponsive and a code was called in dialysis.  Patient is DNR and no further resuscitation was attempted and she was brought back to her room.  I went to the patient bedside after she returned from dialysis and she is much more unresponsive than this morning but will open her eyes.  Her vitals are stable but not communicating like she was this morning.  New onset bradycardia.  I have contacted the patient's husband and confirmed that indeed she is DNR and we have also asked palliative care for involvement for transition to comfort measures if possible.  Marty Heck, MD [facility] Office: [place]

## 2020-05-12 NOTE — Progress Notes (Signed)
   05/12/20 1254  Clinical Encounter Type  Visited With Patient not available  Visit Type Code  Referral From Nurse  Consult/Referral To Chaplain   Chaplain responded to Code Blue. No family present and no current need for chaplain. Chaplain remains available.   This note was prepared by Chaplain Resident, Dante Gang, MDiv. Chaplain remains available as needed through the on-call pager: [phone].

## 2020-05-12 NOTE — Progress Notes (Signed)
Pt start hemodialysis 30 mins after  became unresponsive. Code blue called  but Pt is DNR. Treatment terminated and report given to Seward Grater RN. Pt sent to her room . BP 130/50. HR 50. O2.sat 98. Dr. Marval Regal notified.

## 2020-05-12 NOTE — Progress Notes (Signed)
Palliative Medicine Inpatient Follow Up Note  Reason for consult:  Goals of Care  HPI:  Per Hospitalist Note--> 75 year old with history of ESRD on hemodialysis, status post right external iliac and common femoral enterectomy with right femoral to anterior tibial artery bypass, insulin-dependent diabetes mellitus, type II, essential hypertension, moderate pulmonary pretension, presented with infected right groin surgical wound on 04/23/2020 status post I&D and wound VAC placement. She was started on antibiotics by vascular surgery. Remains to be doing poorly and is encephalopathic.  Palliative care has been asked to get involved to discuss goals of care. Vascular surgery has informed the patients family of this consultation and that topics such as hospice will be discussed.  Today's Discussion (05/12/2020):  *Please note that this is a verbal dictation therefore any spelling or grammatical errors are due to the "[place] One" system interpretation.  I met with [name] this morning.  She was alert to self and place.  We discussed that I had spoken to her husband and son yesterday.  I did bring up that we talked about potentially stopping dialysis and transitioning focus to comfort measures.  [name] had a confused look on her face.  I provided some explanation as to what this all meant though it did not seem like she was quite understanding and at that time.  I asked her if I could call her husband had to talk more about this which she did not to.  She fell asleep shortly thereafter.  I called [name] this morning and he expressed that he had a bit of an argument with [name] sister yesterday which has caused him some distress.  He shares with me that his son is planning to fly in to [facility] tomorrow though he cannot confirm a time given the present weather conditions in [place].  We discussed that at this point it would be prudent to have all of [name] get together with [name] to identify what the  best course of action would be.  [name] is in agreement with this and shares that he would like to coordinate for a meeting either Sunday or Monday.  [name] expresses great conflict about what steps to take next in regards to his wife.  We discussed either continuing with the present course and seeing if [name] may be able to rehabilitate back at [facility].  I shared with him that he should bear in mind her active infection though it is improving with current interventions it could certainly be something that presents as problematic moving forward.  We discussed alternatively stopping dialysis and focusing on comfort interventions.  [name] shares that "I do not know what to do".  I reassured him that there is no right answer and having [name] all meet together may provide the clarity he needs.  He was thankful for this and expressed that he will call the palliative medicine service when he is ready to determine a date and time for a family meeting.  Discussed the importance of continued conversation with family and their  medical providers regarding overall plan of care and treatment options, ensuring decisions are within the context of the patients values and GOCs.  Questions and concerns addressed   Objective Assessment: Vital Signs Vitals:   05/12/20 0623 05/12/20 0801  BP:  117/66  Pulse:  80  Resp: 17 20  Temp: 97.7 F (36.5 C) (!) 97.3 F (36.3 C)  SpO2:  98%    Intake/Output Summary (Last 24 hours) at 05/12/2020 0827 Last  data filed at 05/12/2020 0700 Gross per 24 hour  Intake 440 ml  Output 175 ml  Net 265 ml   Last Weight  Most recent update: 05/11/2020  9:56 PM   Weight  68 kg (150 lb)           Gen:  Frail elderly F in NAD HEENT: dry mucous membranes CV: Regular rate and rhythm  PULM: clear to auscultation bilaterally  ABD: soft/nontender  EXT: (+) R groin wound vac Neuro: Alert and oriented to person though oscillates in conversation  SUMMARY OF RECOMMENDATIONS   DNAR/DNI  Plan  for a family meeting on Sunday or Monday (this depends on when patients son's flight gets in from [place] with the current weather conditions)  Ongoing PMT discussions  Time Spent: 35 Greater than 50% of the time was spent in counseling and coordination of care ______________________________________________________________________________________ [place] Team Team Cell Phone: [phone] Please utilize secure chat with additional questions, if there is no response within 30 minutes please call the above phone number  Palliative Medicine Team providers are available by phone from 7am to 7pm daily and can be reached through the team cell phone.  Should this patient require assistance outside of these hours, please call the patient's attending physician.

## 2020-05-12 NOTE — Progress Notes (Signed)
Husband called and wanted to speak with Sharyn Lull in palliative care. Called palliative on call to make aware that husband needed a call back. Primary RN Angie made aware. Payton Emerald, RN

## 2020-05-13 DIAGNOSIS — Z7189 Other specified counseling: Secondary | ICD-10-CM | POA: Diagnosis not present

## 2020-05-13 DIAGNOSIS — Z66 Do not resuscitate: Secondary | ICD-10-CM | POA: Diagnosis not present

## 2020-05-13 DIAGNOSIS — Z515 Encounter for palliative care: Secondary | ICD-10-CM | POA: Diagnosis not present

## 2020-05-13 MED ORDER — FENTANYL CITRATE (PF) 100 MCG/2ML IJ SOLN
50.0000 ug | INTRAMUSCULAR | Status: DC
  Administered 2020-05-13 – 2020-05-16 (×16): 50 ug via INTRAVENOUS
  Filled 2020-05-13 (×17): qty 2

## 2020-05-13 MED ORDER — LORAZEPAM 2 MG/ML IJ SOLN
1.0000 mg | INTRAMUSCULAR | Status: DC
  Administered 2020-05-13 – 2020-05-16 (×16): 1 mg via INTRAVENOUS
  Filled 2020-05-13 (×17): qty 1

## 2020-05-13 NOTE — Progress Notes (Signed)
Events of CODE BLUE and decision to transition to comfort measures noted.  Agree with the decision. Will sign off.  Please call with questions or concerns

## 2020-05-13 NOTE — Progress Notes (Signed)
Palliative Medicine Inpatient Follow Up Note  Reason for consult:  Goals of Care  HPI:  Per Hospitalist Note--> 75 year old with history of ESRD on hemodialysis, status post right external iliac and common femoral enterectomy with right femoral to anterior tibial artery bypass, insulin-dependent diabetes mellitus, type II, essential hypertension, moderate pulmonary pretension, presented with infected right groin surgical wound on 04/23/2020 status post I&D and wound VAC placement. She was started on antibiotics by vascular surgery. Remains to be doing poorly and is encephalopathic.  Palliative care has been asked to get involved to discuss goals of care. Vascular surgery has informed the patients family of this consultation and that topics such as hospice will be discussed.  Today's Discussion (05/13/2020):  *Please note that this is a verbal dictation therefore any spelling or grammatical errors are due to the "[place] One" system interpretation.  I met at bedside with [name] and patients husband [name]. [name] was noted to be unarousable though had frequent movement in her legs. [name] shares with me that he was able to arrive later yesterday and then came back early this morning. He asked me if there is anything more that can be done for [name] to improve her situation. We reviewed her current medical illness and the fact that she suffered a code blue yesterday. I shared that it is clear her body is unable to tolerate the present burden it is enduring.   We talked about continuing to keep [name] comfortable while in the hospital and transitioning her to an inpatient hospice home. [name] shares that a friend of his was at [facility] and he feels that it was a good experience. I told him that I will make this referral with SW.  [name] shares with me the elements in he and [name]'s life that have been important inclusive of their pets. They had a mixed rescue dog at home. We further  discussed his relationship with [name]'s family and how it is "not great".   [name]'s son, [name] will be driving down from [place] today.   Questions and concerns addressed   I spoke to MSW, [name] after leaving the room to confirm request for [facility] inpatient hospice.  Objective Assessment: Vital Signs Vitals:   05/12/20 1242 05/12/20 1334  BP: (!) 131/54 101/61  Pulse: 66 (!) 49  Resp: 18 16  Temp:    SpO2: 98% 99%    Intake/Output Summary (Last 24 hours) at 05/13/2020 1010 Last data filed at 05/12/2020 1242 Gross per 24 hour  Intake --  Output -100 ml  Net 100 ml   Last Weight  Most recent update: 05/12/2020  4:11 PM   Weight  68 kg (149 lb 14.6 oz)           Gen:  Frail elderly F in NAD HEENT: dry mucous membranes CV: Irregular  rhythm  PULM: 2LPM [place], shallow breaths ABD: soft/nontender  EXT: (+) R groin wound vac Neuro: Somnolent  SUMMARY OF RECOMMENDATIONS   DNAR/DNI  Comfort Measures  Medications per MAR  Unrestricted Visitation  TOC - [facility]  Ongoing PMT support  Time Spent: 35 Greater than 50% of the time was spent in counseling and coordination of care ______________________________________________________________________________________ [place] Team Team Cell Phone: [phone] Please utilize secure chat with additional questions, if there is no response within 30 minutes please call the above phone number  Palliative Medicine Team providers are available by phone from  7am to 7pm daily and can be reached through the team cell phone.  Should this patient require assistance outside of these hours, please call the patient's attending physician.

## 2020-05-13 NOTE — Progress Notes (Signed)
Chaplain responded to spiritual care consult for EOL.  Husband and sister are in room.  Chaplain provided emotional and spiritual support including prayer, as requested by sister. Husband is polite but slightly more matter-of-fact than sister, who is teary.  Sons are en route by car from [place].   Please call as needed for further support.  Minus Liberty, [place]

## 2020-05-13 NOTE — TOC Progression Note (Signed)
Transition of Care [facility]) - Progression Note    Patient Details  Name: [name] MRN: [id number] Date of Birth: 1945/06/23  Transition of Care [facility]) CM/SW [place], [place] Phone Number: [phone] 05/13/2020, 9:21 AM  Clinical Narrative:     CSW was alerted that patient's family was seeking residential hospice through [facility]. CSW made referral and spoke with Tharon Aquas and she informed CSW that they do not have any beds available at either facility.   TOC team will continue to assist with discharge planning needs.  Expected Discharge Plan: Skilled Nursing Facility Barriers to Discharge: Continued Medical Work up  Expected Discharge Plan and Services Expected Discharge Plan: [place] In-house Referral: Clinical Social Work                                             Social Determinants of Health (SDOH) Interventions    Readmission Risk Interventions Readmission Risk Prevention Plan 03/18/2020  Transportation Screening Complete  HRI or Home Care Consult Complete  SW Recovery Care/Counseling Consult Complete  Palliative Care Screening Not Applicable  Skilled Nursing Facility Complete  Some recent data might be hidden

## 2020-05-13 NOTE — Progress Notes (Addendum)
Progress Note    05/13/2020 8:32 AM 19 Days Post-Op  Subjective:  Yesterdays events noted. Her husband is at bedside. She awakens easily, drifts readily back to sleep.   Vitals:   05/12/20 1242 05/12/20 1334  BP: (!) 131/54 101/61  Pulse: 66 (!) 49  Resp: 18 16  Temp:    SpO2: 98% 99%    Physical Exam: Cardiac:irregular rythym Lungs:Non labored Incisions:right lower extremity incisions healing well. Right groin wound VAC in place with good seal. Clear, thin bloody drainage. She has stable non expanding hematoma in the right upper thigh.    CBC    Component Value Date/Time   WBC 7.2 05/12/2020 0139   RBC 2.56 (L) 05/12/2020 0139   HGB 8.3 (L) 05/12/2020 0139   HGB 11.7 05/18/2017 1218   HGB 12.3 02/02/2017 1136   HCT 25.7 (L) 05/12/2020 0139   HCT 34.7 05/18/2017 1218   HCT 37.4 02/02/2017 1136   PLT 141 (L) 05/12/2020 0139   PLT 227 05/18/2017 1218   MCV 100.4 (H) 05/12/2020 0139   MCV 87 05/18/2017 1218   MCV 90.6 02/02/2017 1136   MCH 32.4 05/12/2020 0139   MCHC 32.3 05/12/2020 0139   RDW 21.8 (H) 05/12/2020 0139   RDW 16.0 (H) 05/18/2017 1218   RDW 15.7 (H) 02/02/2017 1136   LYMPHSABS 1.1 04/26/2020 2141   LYMPHSABS 1.8 02/02/2017 1136   MONOABS 0.7 04/24/2020 2141   MONOABS 0.8 02/02/2017 1136   EOSABS 0.1 05/07/2020 2141   EOSABS 0.3 02/02/2017 1136   BASOSABS 0.0 04/18/2020 2141   BASOSABS 0.1 02/02/2017 1136    BMET    Component Value Date/Time   NA 136 05/11/2020 0024   NA 143 05/18/2017 1218   NA 143 02/02/2017 1137   K 4.6 05/11/2020 0024   K 3.8 02/02/2017 1137   CL 101 05/11/2020 0024   CO2 19 (L) 05/11/2020 0024   CO2 24 02/02/2017 1137   GLUCOSE 93 05/11/2020 0024   GLUCOSE 74 02/02/2017 1137   BUN 66 (H) 05/11/2020 0024   BUN 76 (HH) 05/18/2017 1218   BUN 63.4 (H) 02/02/2017 1137   CREATININE 7.97 (H) 05/11/2020 0024   CREATININE 5.12 (H) 02/13/2020 1518   CREATININE 3.0 (HH) 02/02/2017 1137   CALCIUM 8.5 (L) 05/11/2020  0024   CALCIUM 9.9 02/02/2017 1137   GFRNONAA 5 (L) 05/11/2020 0024   GFRNONAA 8 (L) 02/13/2020 1518   GFRAA 9 (L) 02/13/2020 1518     Intake/Output Summary (Last 24 hours) at 05/13/2020 0263 Last data filed at 05/12/2020 1242 Gross per 24 hour  Intake --  Output -100 ml  Net 100 ml    HOSPITAL MEDICATIONS Scheduled Meds: . fentaNYL (SUBLIMAZE) injection  50 mcg Intravenous Q4H  . LORazepam  1 mg Intravenous Q4H   Continuous Infusions: PRN Meds:.acetaminophen, acetaminophen, bisacodyl, fentaNYL (SUBLIMAZE) injection, glycopyrrolate, hydroxypropyl methylcellulose / hypromellose, LORazepam, ondansetron  Assessment and Plan: POD 17  75 y.o.femaleis s/p rightgroin sartorius muscle flapsecondary to right groin wound infection.She is now POD 12from initial wash-out and debridement. DNR with onset of bradycardia and unresponsiveness yesterday.  Mr. Patlan states his son is travelling to [place] today and should arrive around noon. Comfort care measures in place.  Follow-up palliative care plans regarding hospice care and transfer.   [name], PA-C [facility] [phone] 05/13/2020  8:32 AM   I have seen and evaluated the patient. I agree with the PA note as documented above.  A code was called  in dialysis yesterday but patient is DNR.  Her family is now at bedside.  Patient fairly sedated.  Appreciate palliative care input and she is now comfort care.  She seems comfortable this morning.  Marty Heck, MD [facility] [place] Office: [phone]

## 2020-05-13 NOTE — Progress Notes (Signed)
TRH consulted for acute encephalopathy, which had improved.  Unfortunately yesterday, 05/12/2020, patient did become unresponsive in hemodialysis and a CODE BLUE was called.  Patient is in fact DNR.  Palliative care has been following the patient and transitioning to comfort care.  TRH will follow peripherally.  [name] D.O. [facility]  If 7PM-7AM, please contact night-coverage [email or web address] 05/13/2020, 8:45 AM

## 2020-05-14 NOTE — Progress Notes (Signed)
Pt bladder scanned per MD order.  0 ml found in bladder.  Will continue to monitor.

## 2020-05-14 NOTE — Progress Notes (Signed)
Chaplain followed up on visit from [facility]. Patient EOL. Husband was not present. Patient was sleeping and room was darkened. Chaplain offered prayer and exited. Chaplain spoke with NP.   05/14/20 1000  Clinical Encounter Type  Visited With Patient  Visit Type Follow-up  Referral From Nurse  Consult/Referral To Chaplain  Spiritual Encounters  Spiritual Needs Prayer  Stress Factors  Patient Stress Factors Major life changes  Family Stress Factors Loss

## 2020-05-14 NOTE — Progress Notes (Signed)
Wound vac dressing removed per MD order without difficulty.  Wet to dry gauze dressing applied. Will continue to monitor.

## 2020-05-14 NOTE — Progress Notes (Signed)
  Progress Note    05/14/2020 7:41 AM 20 Days Post-Op  Subjective:  will awaken easily but drifts quickly back to sleep. No family at bedside this morning   Vitals:   05/13/20 1618 05/14/20 0617  BP: (!) 117/45 116/67  Pulse: 76 83  Resp: 15 12  Temp: (!) 97.3 F (36.3 C) (!) 91.4 F (33 C)  SpO2: 98%    Physical Exam: General: appears comfortable Cardiac: irregular rhythm Lungs:  Non labored Incisions:  Right groin incision with wound VAC in place. Good seal. Right lower extremity incisions healing well. Right thigh hematoma unchanged Extremities:  Well perfused and warm. Right foot dressings clean, dry and intact   CBC    Component Value Date/Time   WBC 7.2 05/12/2020 0139   RBC 2.56 (L) 05/12/2020 0139   HGB 8.3 (L) 05/12/2020 0139   HGB 11.7 05/18/2017 1218   HGB 12.3 02/02/2017 1136   HCT 25.7 (L) 05/12/2020 0139   HCT 34.7 05/18/2017 1218   HCT 37.4 02/02/2017 1136   PLT 141 (L) 05/12/2020 0139   PLT 227 05/18/2017 1218   MCV 100.4 (H) 05/12/2020 0139   MCV 87 05/18/2017 1218   MCV 90.6 02/02/2017 1136   MCH 32.4 05/12/2020 0139   MCHC 32.3 05/12/2020 0139   RDW 21.8 (H) 05/12/2020 0139   RDW 16.0 (H) 05/18/2017 1218   RDW 15.7 (H) 02/02/2017 1136   LYMPHSABS 1.1 04/30/2020 2141   LYMPHSABS 1.8 02/02/2017 1136   MONOABS 0.7 05/03/2020 2141   MONOABS 0.8 02/02/2017 1136   EOSABS 0.1 04/21/2020 2141   EOSABS 0.3 02/02/2017 1136   BASOSABS 0.0 05/02/2020 2141   BASOSABS 0.1 02/02/2017 1136    BMET    Component Value Date/Time   NA 136 05/11/2020 0024   NA 143 05/18/2017 1218   NA 143 02/02/2017 1137   K 4.6 05/11/2020 0024   K 3.8 02/02/2017 1137   CL 101 05/11/2020 0024   CO2 19 (L) 05/11/2020 0024   CO2 24 02/02/2017 1137   GLUCOSE 93 05/11/2020 0024   GLUCOSE 74 02/02/2017 1137   BUN 66 (H) 05/11/2020 0024   BUN 76 (HH) 05/18/2017 1218   BUN 63.4 (H) 02/02/2017 1137   CREATININE 7.97 (H) 05/11/2020 0024   CREATININE 5.12 (H) 02/13/2020  1518   CREATININE 3.0 (HH) 02/02/2017 1137   CALCIUM 8.5 (L) 05/11/2020 0024   CALCIUM 9.9 02/02/2017 1137   GFRNONAA 5 (L) 05/11/2020 0024   GFRNONAA 8 (L) 02/13/2020 1518   GFRAA 9 (L) 02/13/2020 1518    INR    Component Value Date/Time   INR 1.3 (H) 05/06/2020 0625    No intake or output data in the 24 hours ending 05/14/20 0741   Assessment/Plan:  75 y.o. female is s/prightgroin sartorius muscle flapsecondary to right groin wound infection 20 Days Post-Op. Had CEA arrest Saturday. Confirmed DNR. Appreciate palliative care assistance. Family decision for comfort care/ hospice. TOC assisting in placement. No beds available currently.    Karoline Caldwell, PA-C [facility] [phone] 05/14/2020 7:41 AM

## 2020-05-14 NOTE — TOC Progression Note (Signed)
Transition of Care [facility]) - Progression Note    Patient Details  Name: [name] MRN: [id number] Date of Birth: [birth year]  Transition of Care [facility]) CM/SW [place], [place] Phone Number: 05/14/2020, 2:10 PM  Clinical Narrative:     CSW spoke  With patient's spouse,[name]- CSW verbally gave Hospice facilities listed on Medicare.gov- he states family needs a facility that close to their home- he decided on [facility].   CSW called and made referral to [facility] per [facility]no available bed today.   CSW will continue to follow and assist with discharge planning.   Thurmond Butts, MSW, LCSW Clinical Social Worker   Expected Discharge Plan: Skilled Nursing Facility Barriers to Discharge: Continued Medical Work up  Expected Discharge Plan and Services Expected Discharge Plan: [place] In-house Referral: Clinical Social Work                                             Social Determinants of Health (SDOH) Interventions    Readmission Risk Interventions Readmission Risk Prevention Plan 03/18/2020  Transportation Screening Complete  HRI or Home Care Consult Complete  SW Recovery Care/Counseling Consult Complete  Palliative Care Screening Not Applicable  Skilled Nursing Facility Complete  Some recent data might be hidden

## 2020-05-14 NOTE — Progress Notes (Signed)
[facility])  Mrs. Somera was referred by [facility] for placement at [facility] place. Patient chart has been reviewed and she is eligible.   Spoke with spouse and confirmed interest.   [facility] is unable to offer a room today. Hospital Liaison will follow up tomorrow or sooner if a room becomes available.   A Please do not hesitate to call with questions.   Thank you, Clementeen Hoof, BSN, [facility] [phone]

## 2020-05-14 NOTE — Progress Notes (Signed)
Palliative Medicine RN Note: Symptom check.  Pt is relaxed. No s/s distress, unresponsive. I have concerns that she may be retaining urine, but her last SCr was almost 8. Order placed to bladder scan q shift and place foley prn. Family has no questions at this time. PMT will follow up tomorrow.  Marjie Skiff Elmyra Banwart, RN, BSN, [facility] Palliative Medicine Team 05/14/2020 4:15 PM Office [phone]

## 2020-05-14 NOTE — Consult Note (Signed)
[place] Nurse wound follow up Patient receiving care in [place].   Discussed care of right groin with [name], PA.  Decision was made to discontinue VAC therapy to right groin and implement bid saline moistened gauze dressings to wound.  Patient is now comfort care and moving toward hospice care.  Val Riles, RN, MSN, CWOCN, CNS-BC, pager [phone]

## 2020-05-14 NOTE — TOC Progression Note (Signed)
Transition of Care [facility]) - Progression Note    Patient Details  Name: KATHLEAN CINCO MRN: [id number] Date of Birth: 26-Nov-1945  Transition of Care [facility]) CM/SW [place], [place] Phone Number: 05/14/2020, 10:22 AM  Clinical Narrative:     CSW called [facility] - left voice message   Thurmond Butts, MSW, LCSW Clinical Social Worker   Expected Discharge Plan: Skilled Nursing Facility Barriers to Discharge: Continued Medical Work up  Expected Discharge Plan and Services Expected Discharge Plan: [place] In-house Referral: Clinical Social Work                                             Social Determinants of Health (SDOH) Interventions    Readmission Risk Interventions Readmission Risk Prevention Plan 03/18/2020  Transportation Screening Complete  HRI or Home Care Consult Complete  SW Recovery Care/Counseling Consult Complete  Palliative Care Screening Not Applicable  Skilled Nursing Facility Complete  Some recent data might be hidden

## 2020-05-15 NOTE — Progress Notes (Addendum)
  Progress Note    05/15/2020 7:39 AM 21 Days Post-Op  Subjective:  Resting comfortably. No family present   Vitals:   05/14/20 0840 05/14/20 2032  BP: (!) 92/57 116/66  Pulse: 68 80  Resp: (!) 9 10  Temp:  (!) 93.7 F (34.3 C)  SpO2: 100% 100%   Physical Exam: Cardiac: irregular Lungs: non labored, [place] Incisions:  Right groin wound dressings clean, dry and intact. Right foot dressed Extremities:  Well perfused and warm. Right lower extremity incisions healing well Abdomen: soft, non tender Neurologic: sleeping, sedated  CBC    Component Value Date/Time   WBC 7.2 05/12/2020 0139   RBC 2.56 (L) 05/12/2020 0139   HGB 8.3 (L) 05/12/2020 0139   HGB 11.7 05/18/2017 1218   HGB 12.3 02/02/2017 1136   HCT 25.7 (L) 05/12/2020 0139   HCT 34.7 05/18/2017 1218   HCT 37.4 02/02/2017 1136   PLT 141 (L) 05/12/2020 0139   PLT 227 05/18/2017 1218   MCV 100.4 (H) 05/12/2020 0139   MCV 87 05/18/2017 1218   MCV 90.6 02/02/2017 1136   MCH 32.4 05/12/2020 0139   MCHC 32.3 05/12/2020 0139   RDW 21.8 (H) 05/12/2020 0139   RDW 16.0 (H) 05/18/2017 1218   RDW 15.7 (H) 02/02/2017 1136   LYMPHSABS 1.1 04/17/2020 2141   LYMPHSABS 1.8 02/02/2017 1136   MONOABS 0.7 04/17/2020 2141   MONOABS 0.8 02/02/2017 1136   EOSABS 0.1 04/20/2020 2141   EOSABS 0.3 02/02/2017 1136   BASOSABS 0.0 05/01/2020 2141   BASOSABS 0.1 02/02/2017 1136    BMET    Component Value Date/Time   NA 136 05/11/2020 0024   NA 143 05/18/2017 1218   NA 143 02/02/2017 1137   K 4.6 05/11/2020 0024   K 3.8 02/02/2017 1137   CL 101 05/11/2020 0024   CO2 19 (L) 05/11/2020 0024   CO2 24 02/02/2017 1137   GLUCOSE 93 05/11/2020 0024   GLUCOSE 74 02/02/2017 1137   BUN 66 (H) 05/11/2020 0024   BUN 76 (HH) 05/18/2017 1218   BUN 63.4 (H) 02/02/2017 1137   CREATININE 7.97 (H) 05/11/2020 0024   CREATININE 5.12 (H) 02/13/2020 1518   CREATININE 3.0 (HH) 02/02/2017 1137   CALCIUM 8.5 (L) 05/11/2020 0024   CALCIUM 9.9  02/02/2017 1137   GFRNONAA 5 (L) 05/11/2020 0024   GFRNONAA 8 (L) 02/13/2020 1518   GFRAA 9 (L) 02/13/2020 1518    INR    Component Value Date/Time   INR 1.3 (H) 05/06/2020 0625    No intake or output data in the 24 hours ending 05/15/20 0739   Assessment/Plan:  75 y.o. female is s/p rightgroin sartorius muscle flapsecondary to right groin wound infection  21 Days Post-Op. Right groin with wet to dry dressing changes. Appears comfortable. Appreciate palliative care assistance. TOC assisting in placement at [facility] place when bed available    Marval Regal [facility] [phone] 05/15/2020 7:39 AM    I have examined the patient, reviewed and agree with above.  Resting comfortably in her room.  Her son is in the room and I had a discussion with him and he is very appreciative of her care.  Curt Jews, MD 05/15/2020 5:50 PM

## 2020-05-15 NOTE — Progress Notes (Signed)
Palliative Medicine RN Note: On AM chart review, I note that LPN held comfort meds overnight, as pt was resting/sleeping.   Dr Hilma Favors requested order be placed that those meds NOT be held unless family requests it. The meds are to prevent the development of symptoms related to they dying process. The expected outcome of this admission is death, either here or at a hospice facility.   I will follow up when I come into the office later today.  Marjie Skiff Rajat Staver, RN, BSN, [facility] Palliative Medicine Team 05/15/2020 7:28 AM Office [phone]

## 2020-05-15 NOTE — Progress Notes (Signed)
Bladder scan 16 ml

## 2020-05-15 NOTE — Progress Notes (Signed)
Palliative Medicine RN Note: Symptom check. Pt is groaning and moving around and generally does not appear comfortable. Son is at bedside.   RN will bring ativan and fentanyl. I have requested a small fan to the bedside to provide a breeze on her face, and I turned the room temp down.   I am hesitant to increase the dose of her scheduled meds, as there was a delay getting the most recent dose of her medications, but if she required additional breakthrough doses o/n, I will request a dose change tomorrow.  Marjie Skiff Teja Judice, RN, BSN, [facility] Palliative Medicine Team 05/15/2020 3:23 PM Office [phone]

## 2020-05-15 NOTE — Progress Notes (Signed)
Patient has been sleeping all shift. Some times open right eye or moves lips some when spoken too. Very sleepy, Not moving around Resp.  8. And shallow . No distress noted. Reden R.N. into to assess Held midnight meds cont. To monitor closedly

## 2020-05-15 NOTE — Progress Notes (Signed)
Patient has now been transitioned to comfort care and is now pending residential hospice bed availability.  TRH will sign off.

## 2020-05-15 NOTE — Care Management Important Message (Signed)
Important Message  Patient Details  Name: [name] MRN: [id number] Date of Birth: [birth year]   Medicare Important Message Given:  Yes     [name] 05/15/2020, 11:04 AM

## 2020-05-15 NOTE — Progress Notes (Signed)
[facility] ([facility]) Hospital Liaison note.   ° °[facility] is unable to offer a room today. Hospice liaison will update with any change. Please do not hesitate to call with questions.     °A Please do not hesitate to call with questions.     ° °Thank you,     °Mary Anne Robertson, RN, CCM        °[facility] Hospital Liaison (listed on AMION under Hosp[facility] /[facility])      °[phone]  °

## 2020-05-16 MED ORDER — ACETAMINOPHEN 650 MG RE SUPP: 650.0000 mg | RECTAL | 0 refills | Status: AC | PRN

## 2020-05-16 MED ORDER — LORAZEPAM 2 MG/ML IJ SOLN: 1.0000 mg | INTRAMUSCULAR | 0 refills | Status: AC

## 2020-05-16 MED ORDER — ONDANSETRON HCL 4 MG/2ML IJ SOLN: 4.0000 mg | Freq: Four times a day (QID) | INTRAMUSCULAR | 0 refills | Status: AC | PRN

## 2020-05-16 MED ORDER — GLYCOPYRROLATE 0.2 MG/ML IJ SOLN: 0.4000 mg | INTRAMUSCULAR | 0 refills | Status: AC | PRN

## 2020-05-16 MED ORDER — FENTANYL CITRATE (PF) 100 MCG/2ML IJ SOLN: 25.0000 ug | INTRAMUSCULAR | 0 refills | Status: AC | PRN

## 2020-05-16 MED ORDER — HYPROMELLOSE (GONIOSCOPIC) 2.5 % OP SOLN: 2.0000 [drp] | Freq: Four times a day (QID) | OPHTHALMIC | 12 refills | Status: AC | PRN

## 2020-05-16 MED ORDER — LORAZEPAM 2 MG/ML IJ SOLN: 0.5000 mg | INTRAMUSCULAR | 0 refills | Status: AC | PRN

## 2020-05-16 MED ORDER — FENTANYL CITRATE (PF) 100 MCG/2ML IJ SOLN: 50.0000 ug | INTRAMUSCULAR | 0 refills | Status: AC

## 2020-05-16 MED ORDER — ACETAMINOPHEN 325 MG PO TABS: 650.0000 mg | ORAL_TABLET | Freq: Four times a day (QID) | ORAL | 0 refills | Status: AC | PRN

## 2020-05-21 ENCOUNTER — Ambulatory Visit: Payer: Medicare Other | Admitting: Adult Health

## 2020-06-05 NOTE — Progress Notes (Addendum)
   Patient without verbal communication, appears comfortable. Extremities warm to touch Resting  Vitals:   05/15/20 0716 05/15/20 2028  BP: (!) 112/49 (!) 106/47  Pulse: 69 71  Resp: (!) 9   Temp: (!) 97.3 F (36.3 C)   SpO2: 99% 100%    Assessment/Plan:  75 y.o. female is s/p rightgroin sartorius muscle flapsecondary to right groin wound infection 22 Days Post-Op. Right groin with wet to dry dressing changes. Appears comfortable. Appreciate palliative care assistance.   Any recommendation are appreciated. Pending Hospice care facility availability.  Roxy Horseman PA-C [facility]  [phone]  I have examined the patient, reviewed and agree with above.  The patient just passed away.  Her son was present.  All are comfortable with her passing.  Curt Jews, MD 2020/06/14 2:40 PM

## 2020-06-05 NOTE — Progress Notes (Signed)
   06/06/2020 1435  Clinical Encounter Type  Visited With Patient and family together  Visit Type Death  Referral From Nurse  Consult/Referral To Chaplain  Spiritual Encounters  Spiritual Needs Emotional;Prayer  Stress Factors  Family Stress Factors Loss;Major life changes  Chaplain responded to page for [name], she came to end of life.  Her son [name] was at the bedside.  Offered support, comfort and prayer.  He appreciated the visit and prayer.  Chaplain Vincella Morgan-Simpson  [phone]

## 2020-06-05 NOTE — Discharge Summary (Signed)
[facility] Discharge Summary   Patient ID:  [name] MRN: [id number] DOB/AGE: [birth year] 75 y.o.  Admit date: 05-18-20 Deceased  date: Jun 11, 2020 Date of Surgery: 04/22/2020 Surgeon: Surgeon(s): Waynetta Sandy, MD  Admission Diagnosis: Infection of prosthetic vascular graft, initial encounter ([place]) [T82.7XXA] Severe comorbid illness [R69] Status post surgery [Z98.890] Non-healing surgical wound of right groin [T81.89XA]  Discharge Diagnoses:  Infection of prosthetic vascular graft, initial encounter ([place]) [P53.7XXA] Severe comorbid illness [R69] Status post surgery [Z98.890] Non-healing surgical wound of right groin [T81.89XA]  Secondary Diagnoses: Past Medical History:  Diagnosis Date  . Allergy   . Anterior dislocation of left shoulder 05/27/2019  . Arthritis    "maybe in my lower back" (08/27/2012)  . 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 Chronic combined systolic and diastolic CHF (congestive heart failure) ([place])   . CKD (chronic kidney disease), stage IV ([place])   . Coronary artery disease    a. NSTEMI in 05/2009 s/p CABG (LIMA-LAD, SVG-diagonal, SVG-OM1/OM 2).   Marland Kitchen Dyspnea    with exertion  . Family history of anesthesia complication    Mom has a hard time to wake up  . Foot ulcer ([place])    "I've had them on both feet" (08/27/2012)  . Gouty arthritis    "right index finger" (08/27/2012)  . History of radiation therapy 06/09/13-07/06/13   left breast 50Gy  . Hyperlipidemia   . Hypertension   . Infection    right second toe  . Ischemic cardiomyopathy    a. 2011: EF 40-45%. b. EF 55-60% in 04/2017 but shortly after as inpatient was 45-50%.  Marland Kitchen LBBB (left bundle branch block)   . Mitral regurgitation   . Neuropathy    Hx; of B/L feet  . NSTEMI (non-ST elevated myocardial infarction) ([place]) 05/23/2009  . Osteomyelitis of foot ([place])   . PAD (peripheral artery disease) ([facility])    a. LE PAD  (patient previously elected hold off L fem-pop), prev followed by Dr. Bridgett Larsson  . PONV (postoperative nausea and vomiting)   . Sinus headache    "occasionally" (08/27/2012)  . Type II diabetes mellitus ([place])   . Vitamin D deficiency     Procedure(s): GROIN DEBRIDEMENT RIGHT  Discharged Condition: Deseased  HPI: This is a 75 y.o. female status post right external iliac and common femoral endarterectomy with right femoral to anterior tibial artery bypass over 1 month ago.  This was done for critical limb ischemia. She developed right groin incisional breakdown and was placed on IV antibiotics at HD.  She developed drainage and increased pain at the right groin wound site.    Hospital Course:  [name] is a 75 y.o. female is S/P  Procedure(s): GROIN DEBRIDEMENT RIGHT May 18, 2020, 04/30/2020 The wound itself was very clean without foul smell there is no evidence of purulence.  The sartorius muscle flap was healthy.  The dacryon graft was mostly incorporated.  The PTFE graft is incorporated after approximately 3 cm of exposure.  The anastomosis between the PTFE and the Dacron appears to be well intact.  At completion after Pretorius muscle flap placement and wound VAC placement the wound was 9 x 5 cm.  HD was coniThe wound itself was very clean without foul smell there is no evidence of purulence.  The sartorius muscle flap was healthy.  The dacryon graft was mostly incorporated.  The PTFE graft is incorporated after approximately 3 cm of exposure.  The  anastomosis between the PTFE and the Dacron appears to be well intact.  At completion after Pretorius muscle flap placement and wound VAC placement the wound was 9 x 5 cm. HD contnued and wound vac changes three times a week.    She became unresponsive and then confused with periods of alertness.  Narcotics held.  No clear etiology for mentation changes in the moment, and send sepsis work-up including lactic acid and blood culture x2.  She developed right  pleural effusion.     An MRI was done that was negative for acute CVA.  She was not participating, not very responsive to verbal ques.  DNR with onset of bradycardia and unresponsiveness yesterday.  05/12/20.    Palliative consult was called 05/11/20 after Dr. Donnetta Hutching discussed with Family.  Comfort care was set up and the plan was to transfer her to Hospice care.  On 26-May-2020 she passed away 2:40 pm.     Treatment Team:  Waynetta Sandy, MD Early, Arvilla Meres, MD Lequita Halt, MD  Significant Diagnostic Studies: CBC Lab Results  Component Value Date   WBC 7.2 05/12/2020   HGB 8.3 (L) 05/12/2020   HCT 25.7 (L) 05/12/2020   MCV 100.4 (H) 05/12/2020   PLT 141 (L) 05/12/2020    BMET    Component Value Date/Time   NA 136 05/11/2020 0024   NA 143 05/18/2017 1218   NA 143 02/02/2017 1137   K 4.6 05/11/2020 0024   K 3.8 02/02/2017 1137   CL 101 05/11/2020 0024   CO2 19 (L) 05/11/2020 0024   CO2 24 02/02/2017 1137   GLUCOSE 93 05/11/2020 0024   GLUCOSE 74 02/02/2017 1137   BUN 66 (H) 05/11/2020 0024   BUN 76 (HH) 05/18/2017 1218   BUN 63.4 (H) 02/02/2017 1137   CREATININE 7.97 (H) 05/11/2020 0024   CREATININE 5.12 (H) 02/13/2020 1518   CREATININE 3.0 (HH) 02/02/2017 1137   CALCIUM 8.5 (L) 05/11/2020 0024   CALCIUM 9.9 02/02/2017 1137   GFRNONAA 5 (L) 05/11/2020 0024   GFRNONAA 8 (L) 02/13/2020 1518   GFRAA 9 (L) 02/13/2020 1518   COAG Lab Results  Component Value Date   INR 1.3 (H) 05/06/2020   INR 1.6 (H) 04/12/2020   INR 1.2 03/14/2020     Disposition:  Discharge to :Deceased Discharge Instructions    Call MD for:  redness, tenderness, or signs of infection (pain, swelling, bleeding, redness, odor or green/yellow discharge around incision site)   Complete by: As directed    Call MD for:  severe or increased pain, loss or decreased feeling  in affected limb(s)   Complete by: As directed    Call MD for:  temperature >100.5   Complete by: As directed    Resume  previous diet   Complete by: As directed      Allergies as of May 26, 2020      Reactions   Atenolol Rash, Other (See Comments)   Exacerbates gout   Adhesive [tape] Other (See Comments)   SKIN IS VERY SENSITIVE AND BRUISES AND TEARS EASILY; PLEASE USE AN ALTERNATIVE TO TAPE!!   Contrast Media [iodinated Diagnostic Agents] Rash   Iohexol Rash   Latex Rash      Medication List    STOP taking these medications   allopurinol 100 MG tablet Commonly known as: ZYLOPRIM   Aranesp (Albumin Free) 100 MCG/0.5ML Sosy injection Generic drug: Darbepoetin Alfa   ARTIFICIAL TEAR SOLUTION OP   aspirin EC 81 MG  tablet   atorvastatin 10 MG tablet Commonly known as: LIPITOR   b complex-vitamin c-folic acid 0.8 MG Tabs tablet   BD Pen Needle Nano U/F 32G X 4 MM Misc Generic drug: Insulin Pen Needle   bisacodyl 10 MG suppository Commonly known as: DULCOLAX   cinacalcet 30 MG tablet Commonly known as: SENSIPAR   cyclobenzaprine 5 MG tablet Commonly known as: FLEXERIL   docusate sodium 100 MG capsule Commonly known as: COLACE   Doxercalciferol 2 MCG/ML Soln   ferric gluconate 125 mg in sodium chloride 0.9 % 100 mL   hydrALAZINE 25 MG tablet Commonly known as: APRESOLINE   HYDROcodone-acetaminophen 5-325 MG tablet Commonly known as: NORCO/VICODIN   insulin NPH-regular Human (70-30) 100 UNIT/ML injection   Insulin Syringe-Needle U-100 31G X 5/16" 1 ML Misc Commonly known as: BD Insulin Syringe U/F   isosorbide dinitrate 10 MG tablet Commonly known as: ISORDIL   METAMUCIL FIBER PO   Nepro Liqd   PRESERVISION AREDS 2 PO   Prodigy No Coding Blood Gluc test strip Generic drug: glucose blood   RA Saline Enema 19-7 GM/118ML Enem   sevelamer carbonate 800 MG tablet Commonly known as: RENVELA   traZODone 50 MG tablet Commonly known as: DESYREL     TAKE these medications   acetaminophen 325 MG tablet Commonly known as: TYLENOL Take 2 tablets (650 mg total) by mouth  every 6 (six) hours as needed for moderate pain or mild pain.   acetaminophen 650 MG suppository Commonly known as: TYLENOL Place 1 suppository (650 mg total) rectally every 4 (four) hours as needed for fever.   fentaNYL 100 MCG/2ML injection Commonly known as: SUBLIMAZE Inject 0.5-1 mLs (25-50 mcg total) into the vein every hour as needed (dyspnea/pain).   fentaNYL 100 MCG/2ML injection Commonly known as: SUBLIMAZE Inject 1 mL (50 mcg total) into the vein every 4 (four) hours.   glycopyrrolate 0.2 MG/ML injection Commonly known as: ROBINUL Inject 2 mLs (0.4 mg total) into the vein every 4 (four) hours as needed (secretions).   hydroxypropyl methylcellulose / hypromellose 2.5 % ophthalmic solution Commonly known as: ISOPTO TEARS / GONIOVISC Place 2 drops into both eyes 4 (four) times daily as needed for dry eyes.   LORazepam 2 MG/ML injection Commonly known as: ATIVAN Inject 0.25-0.5 mLs (0.5-1 mg total) into the vein every hour as needed for anxiety or sedation.   LORazepam 2 MG/ML injection Commonly known as: ATIVAN Inject 0.5 mLs (1 mg total) into the vein every 4 (four) hours.   ondansetron 4 MG/2ML Soln injection Commonly known as: ZOFRAN Inject 2 mLs (4 mg total) into the vein every 6 (six) hours as needed for nausea or vomiting.      Verbal and written Discharge instructions given to the patient. Wound care per Discharge AVS   Signed: Roxy Horseman 06-05-20, 2:42 PM

## 2020-06-05 NOTE — Progress Notes (Signed)
[facility] ([facility] Hospital Liaison note.     This patient is approved to transfer to [facility] today.   [facility] will notify TOC when registration paperwork has been completed to arrange transport.    RN please call report to [phone].   Thank you,     Mary Anne Robertson, RN, CCM       [facility] Hospital Liaison (listed on AMION under Hosp[facility])     [phone] 

## 2020-06-05 DEATH — deceased

## 2020-08-29 ENCOUNTER — Ambulatory Visit: Payer: Medicare Other | Admitting: Internal Medicine

## 2020-12-26 IMAGING — DX DG FOOT COMPLETE 3+V*R*
3 series · 3 of 3 positions shown · non-contrast
Comparison: None.

CLINICAL DATA: Diabetic foot ulcer.

EXAM:
RIGHT FOOT COMPLETE - 3+ VIEW

[foot ap]
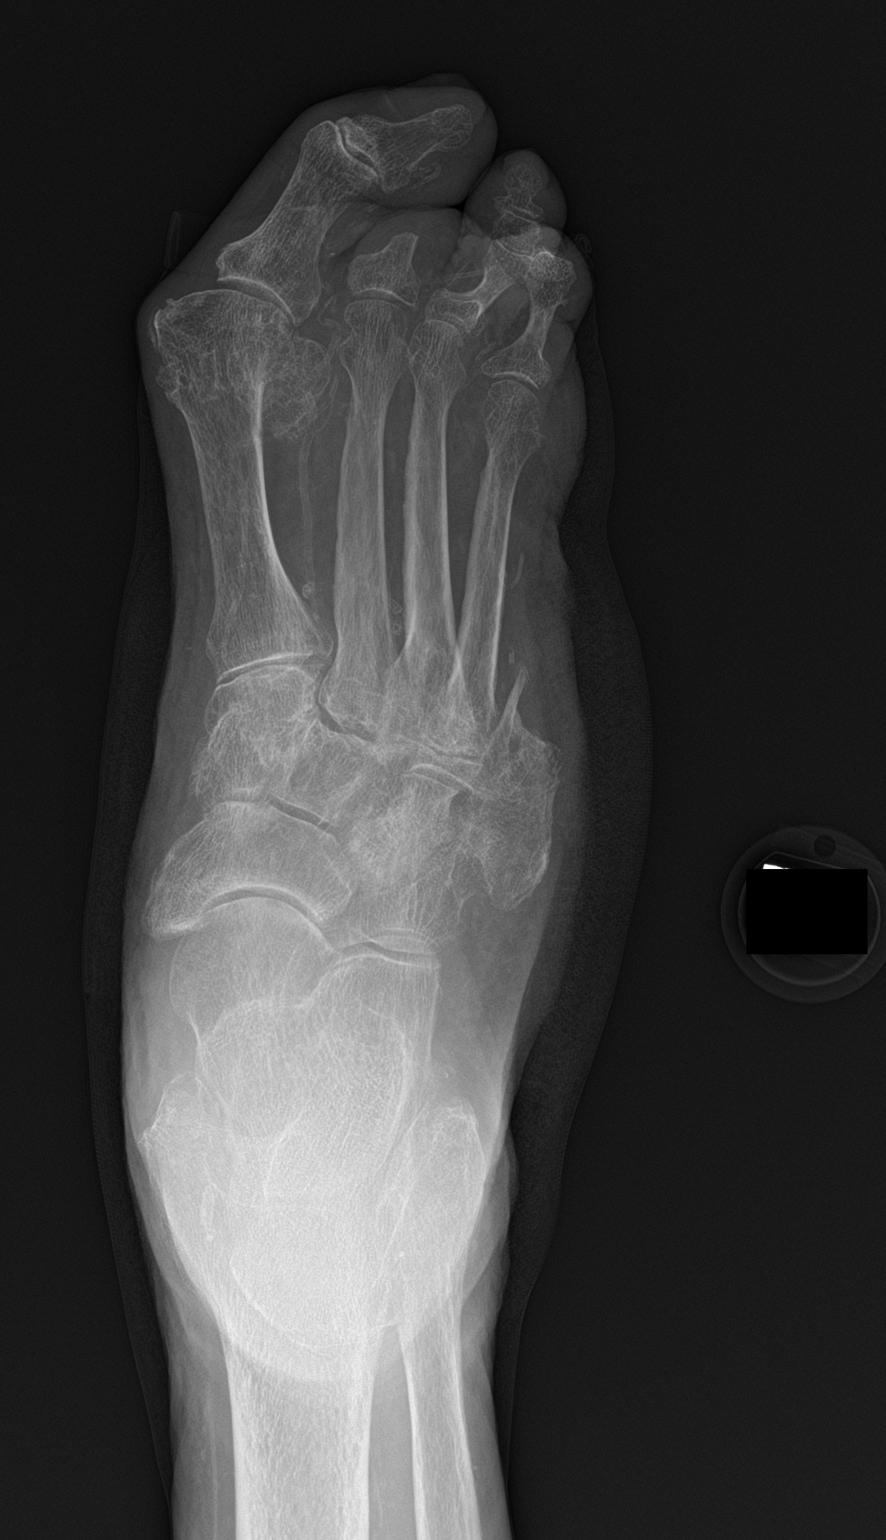

[foot obl]
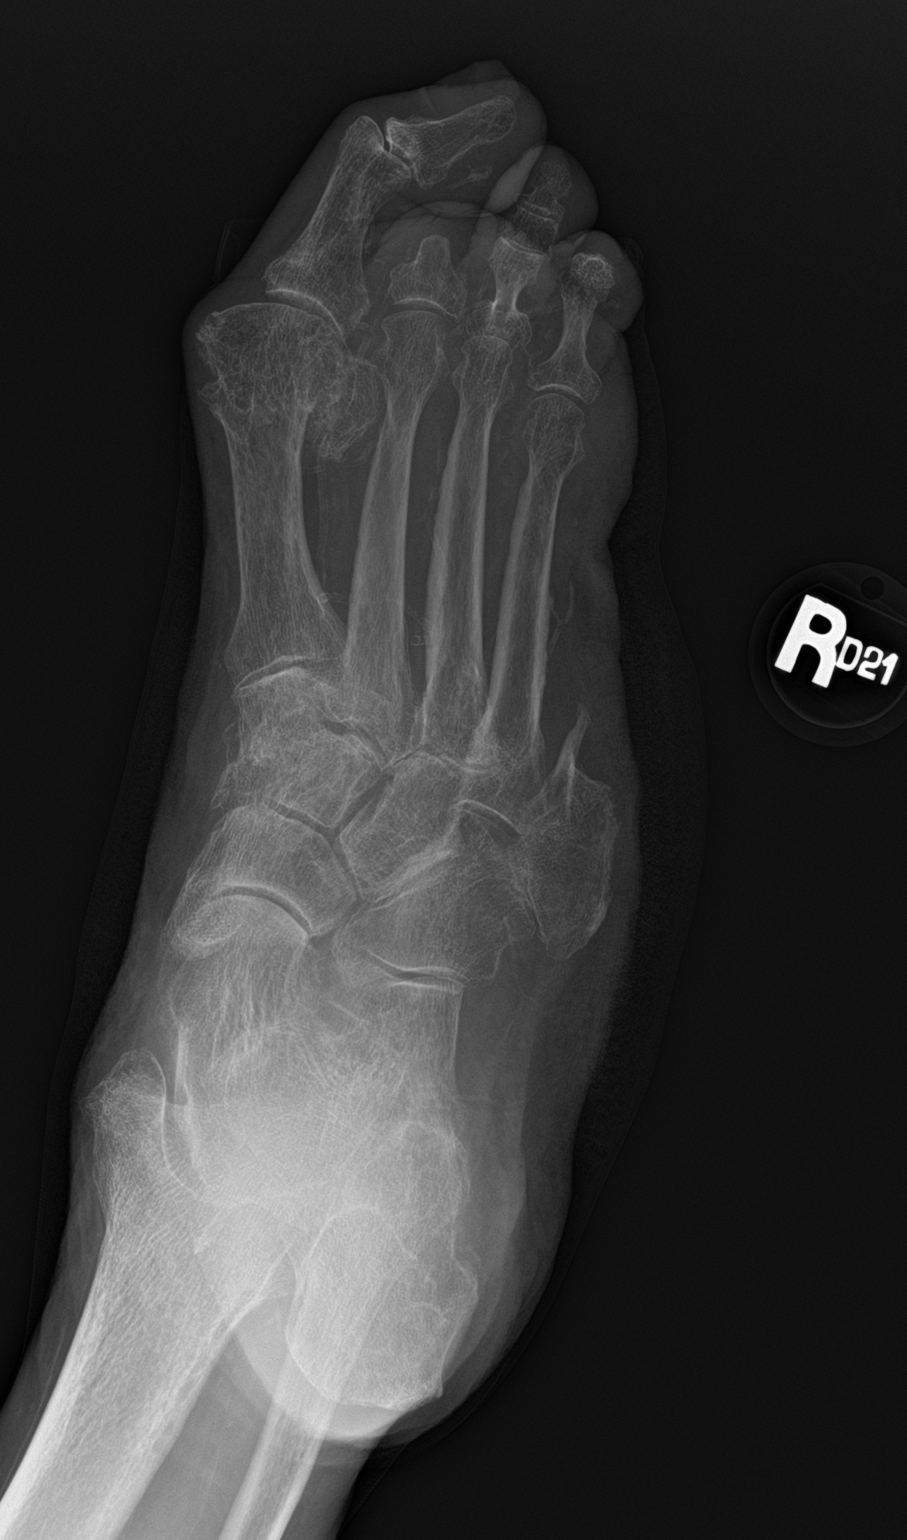

[foot lat]
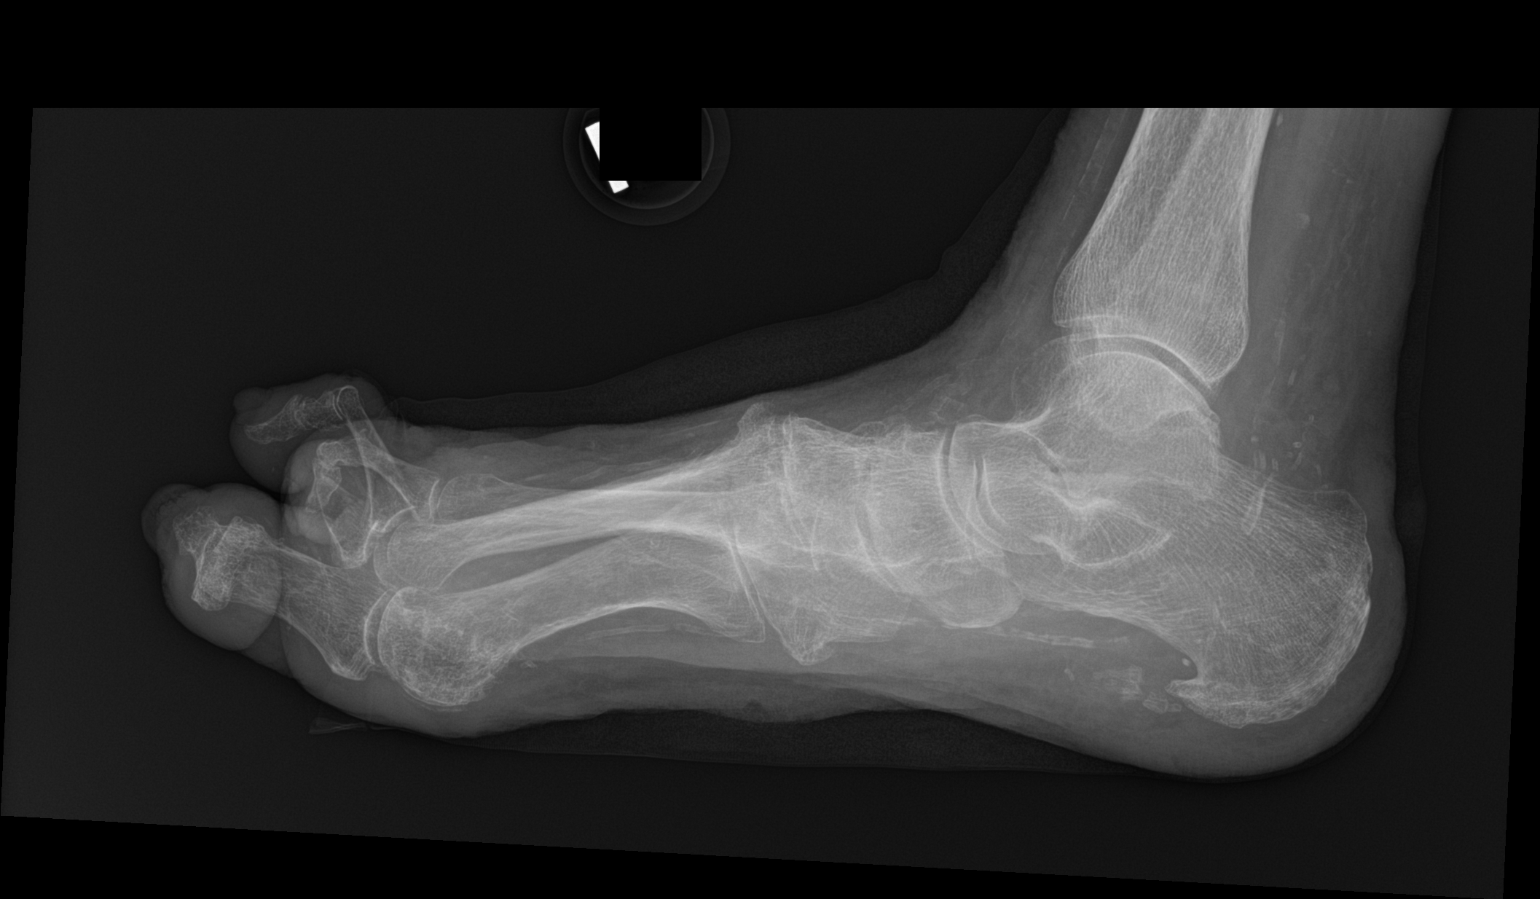

[3 of 3 positions shown; findings below may reference images not displayed]

FINDINGS: Severe osteopenia. Prior transmetatarsal amputation of the fifth
metatarsal at the base. Prior amputation of the midportion of the
second proximal phalanx. No periosteal reaction or bone destruction.
Hallux valgus of the right foot. Pes planus. Peripheral vascular
atherosclerotic disease. Small plantar calcaneal spur.
Osteoarthritis of the tarsometatarsal joints.
IMPRESSION: 1. No radiographic evidence of osteomyelitis of the right foot.

## 2021-02-13 ENCOUNTER — Encounter: Payer: Medicare Other | Admitting: Adult Health

## 2021-03-17 IMAGING — DX DG CHEST 1V
1 series · 1 of 1 positions shown · non-contrast
Comparison: 03/23/2019

CLINICAL DATA: Weakness

EXAM:
CHEST  1 VIEW

[chest]
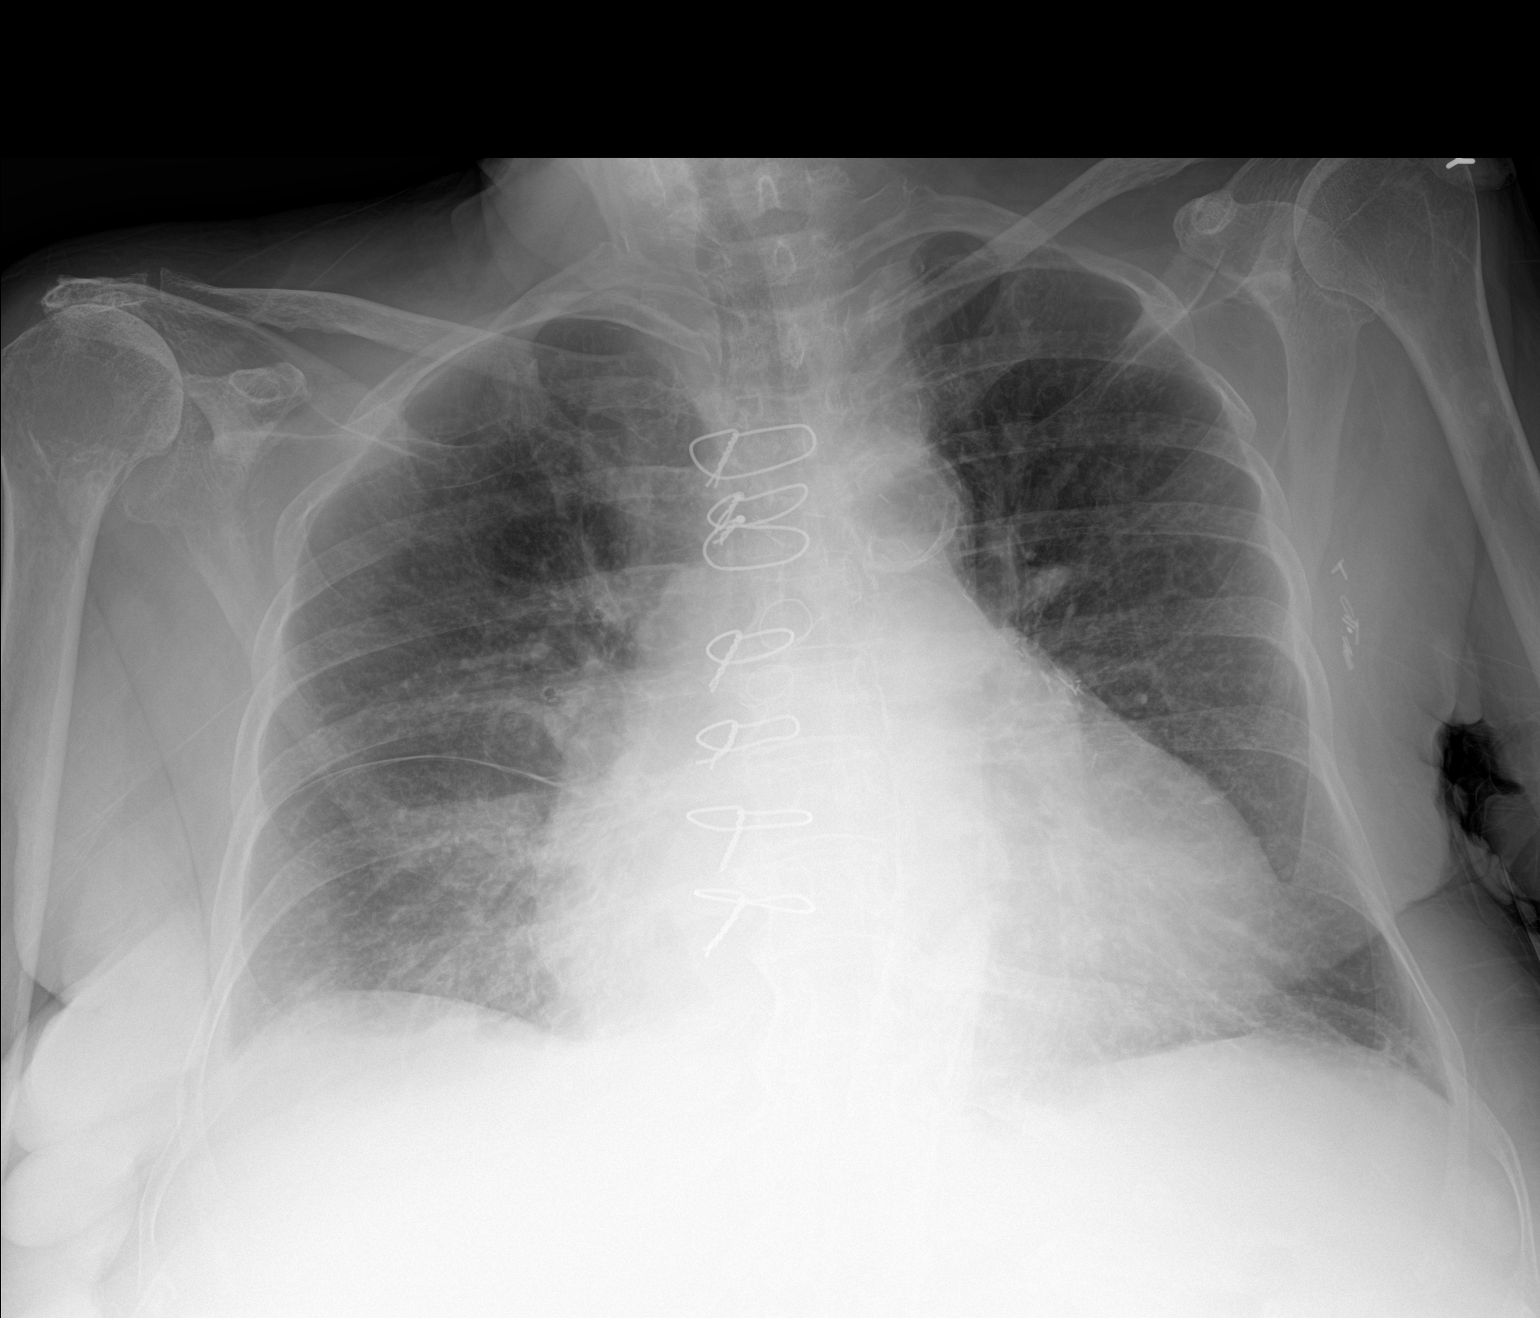

[1 of 1 positions shown; findings below may reference images not displayed]

FINDINGS: Moderate cardiomegaly. Central pulmonary vascular congestion without
overt edema. No focal airspace consolidation. Normal pleural spaces.
Remote median sternotomy.
IMPRESSION: Cardiomegaly and central pulmonary vascular congestion without overt
pulmonary edema.
